# Patient Record
Sex: Female | Born: 1945 | Race: White | Hispanic: No | Smoking: Never smoker
Health system: Southern US, Community
[De-identification: ages and names within clinical notes are randomized; demographics above are authoritative.]

## PROBLEM LIST (undated history)

## (undated) ENCOUNTER — Emergency Department: Payer: Medicare Other

## (undated) DIAGNOSIS — M1990 Unspecified osteoarthritis, unspecified site: Secondary | ICD-10-CM

## (undated) DIAGNOSIS — F909 Attention-deficit hyperactivity disorder, unspecified type: Secondary | ICD-10-CM

## (undated) DIAGNOSIS — D649 Anemia, unspecified: Secondary | ICD-10-CM

## (undated) HISTORY — PX: TONSILLECTOMY: SHX5217

## (undated) HISTORY — DX: Anemia, unspecified: D64.9

## (undated) HISTORY — PX: OTHER SURGICAL HISTORY: SHX169

## (undated) HISTORY — PX: IR PORT REPAIR CENTRAL VENOUS ACCESS DEVICE: IMG5775

## (undated) HISTORY — DX: Unspecified osteoarthritis, unspecified site: M19.90

## (undated) HISTORY — DX: Attention-deficit hyperactivity disorder, unspecified type: F90.9

---

## 1999-12-12 DIAGNOSIS — Z86718 Personal history of other venous thrombosis and embolism: Secondary | ICD-10-CM | POA: Insufficient documentation

## 2000-03-10 DIAGNOSIS — F909 Attention-deficit hyperactivity disorder, unspecified type: Secondary | ICD-10-CM | POA: Insufficient documentation

## 2012-09-05 HISTORY — PX: OTHER SURGICAL HISTORY: SHX169

## 2012-09-30 DIAGNOSIS — Z961 Presence of intraocular lens: Secondary | ICD-10-CM

## 2012-09-30 HISTORY — DX: Presence of intraocular lens: Z96.1

## 2013-05-15 DIAGNOSIS — K219 Gastro-esophageal reflux disease without esophagitis: Secondary | ICD-10-CM

## 2013-05-15 HISTORY — DX: Gastro-esophageal reflux disease without esophagitis: K21.9

## 2013-08-04 DIAGNOSIS — I83023 Varicose veins of left lower extremity with ulcer of ankle: Secondary | ICD-10-CM | POA: Insufficient documentation

## 2013-08-28 DIAGNOSIS — R590 Localized enlarged lymph nodes: Secondary | ICD-10-CM | POA: Insufficient documentation

## 2013-10-05 DIAGNOSIS — C50919 Malignant neoplasm of unspecified site of unspecified female breast: Secondary | ICD-10-CM

## 2013-10-05 HISTORY — DX: Malignant neoplasm of unspecified site of unspecified female breast: C50.919

## 2014-02-25 DIAGNOSIS — C4402 Squamous cell carcinoma of skin of lip: Secondary | ICD-10-CM

## 2014-02-25 HISTORY — PX: MOHS SURGERY: SHX181

## 2014-02-25 HISTORY — DX: Squamous cell carcinoma of skin of lip: C44.02

## 2014-03-30 DIAGNOSIS — I82403 Acute embolism and thrombosis of unspecified deep veins of lower extremity, bilateral: Secondary | ICD-10-CM | POA: Insufficient documentation

## 2014-03-30 HISTORY — DX: Acute embolism and thrombosis of unspecified deep veins of lower extremity, bilateral: I82.403

## 2014-04-03 HISTORY — PX: OTHER SURGICAL HISTORY: SHX169

## 2014-09-14 DIAGNOSIS — Z853 Personal history of malignant neoplasm of breast: Secondary | ICD-10-CM | POA: Insufficient documentation

## 2014-09-14 DIAGNOSIS — Z923 Personal history of irradiation: Secondary | ICD-10-CM | POA: Insufficient documentation

## 2014-09-30 DIAGNOSIS — M6259 Muscle wasting and atrophy, not elsewhere classified, multiple sites: Secondary | ICD-10-CM | POA: Insufficient documentation

## 2014-09-30 DIAGNOSIS — R5381 Other malaise: Secondary | ICD-10-CM | POA: Insufficient documentation

## 2014-09-30 DIAGNOSIS — M1711 Unilateral primary osteoarthritis, right knee: Secondary | ICD-10-CM | POA: Insufficient documentation

## 2015-02-05 DIAGNOSIS — R413 Other amnesia: Secondary | ICD-10-CM | POA: Insufficient documentation

## 2015-02-05 HISTORY — DX: Other amnesia: R41.3

## 2015-11-16 DIAGNOSIS — M7989 Other specified soft tissue disorders: Secondary | ICD-10-CM | POA: Insufficient documentation

## 2015-11-16 DIAGNOSIS — Z7901 Long term (current) use of anticoagulants: Secondary | ICD-10-CM | POA: Insufficient documentation

## 2016-02-20 DIAGNOSIS — E8881 Metabolic syndrome: Secondary | ICD-10-CM

## 2016-02-20 DIAGNOSIS — I10 Essential (primary) hypertension: Secondary | ICD-10-CM | POA: Insufficient documentation

## 2016-02-20 HISTORY — DX: Metabolic syndrome: E88.81

## 2016-02-20 HISTORY — DX: Metabolic syndrome: E88.810

## 2016-02-20 HISTORY — DX: Essential (primary) hypertension: I10

## 2016-03-29 DIAGNOSIS — I83029 Varicose veins of left lower extremity with ulcer of unspecified site: Secondary | ICD-10-CM

## 2016-03-29 DIAGNOSIS — M8580 Other specified disorders of bone density and structure, unspecified site: Secondary | ICD-10-CM | POA: Insufficient documentation

## 2016-03-29 DIAGNOSIS — Z780 Asymptomatic menopausal state: Secondary | ICD-10-CM

## 2016-03-29 DIAGNOSIS — F900 Attention-deficit hyperactivity disorder, predominantly inattentive type: Secondary | ICD-10-CM | POA: Insufficient documentation

## 2016-03-29 DIAGNOSIS — L97929 Non-pressure chronic ulcer of unspecified part of left lower leg with unspecified severity: Secondary | ICD-10-CM

## 2016-03-29 HISTORY — DX: Non-pressure chronic ulcer of unspecified part of left lower leg with unspecified severity: L97.929

## 2016-03-29 HISTORY — DX: Varicose veins of left lower extremity with ulcer of unspecified site: I83.029

## 2016-03-29 HISTORY — DX: Asymptomatic menopausal state: Z78.0

## 2016-03-29 HISTORY — DX: Other specified disorders of bone density and structure, unspecified site: M85.80

## 2016-03-31 DIAGNOSIS — M6281 Muscle weakness (generalized): Secondary | ICD-10-CM

## 2016-03-31 HISTORY — DX: Muscle weakness (generalized): M62.81

## 2018-01-27 DIAGNOSIS — E872 Acidosis, unspecified: Secondary | ICD-10-CM

## 2018-01-27 HISTORY — DX: Acidosis: E87.2

## 2018-01-27 HISTORY — DX: Acidosis, unspecified: E87.20

## 2018-04-25 DIAGNOSIS — N2581 Secondary hyperparathyroidism of renal origin: Secondary | ICD-10-CM

## 2018-04-25 HISTORY — DX: Secondary hyperparathyroidism of renal origin: N25.81

## 2018-05-27 DIAGNOSIS — H04123 Dry eye syndrome of bilateral lacrimal glands: Secondary | ICD-10-CM | POA: Insufficient documentation

## 2018-05-27 DIAGNOSIS — H18599 Other hereditary corneal dystrophies, unspecified eye: Secondary | ICD-10-CM | POA: Insufficient documentation

## 2018-05-27 DIAGNOSIS — H524 Presbyopia: Secondary | ICD-10-CM | POA: Insufficient documentation

## 2018-10-22 DIAGNOSIS — L97221 Non-pressure chronic ulcer of left calf limited to breakdown of skin: Secondary | ICD-10-CM | POA: Insufficient documentation

## 2019-11-26 DIAGNOSIS — R8271 Bacteriuria: Secondary | ICD-10-CM | POA: Insufficient documentation

## 2019-11-26 DIAGNOSIS — D509 Iron deficiency anemia, unspecified: Secondary | ICD-10-CM | POA: Insufficient documentation

## 2020-06-26 DIAGNOSIS — N179 Acute kidney failure, unspecified: Secondary | ICD-10-CM | POA: Insufficient documentation

## 2020-09-03 DIAGNOSIS — D631 Anemia in chronic kidney disease: Secondary | ICD-10-CM | POA: Insufficient documentation

## 2020-09-03 DIAGNOSIS — N184 Chronic kidney disease, stage 4 (severe): Secondary | ICD-10-CM | POA: Insufficient documentation

## 2020-09-03 DIAGNOSIS — N1832 Chronic kidney disease, stage 3b: Secondary | ICD-10-CM | POA: Insufficient documentation

## 2021-02-08 DIAGNOSIS — H43813 Vitreous degeneration, bilateral: Secondary | ICD-10-CM | POA: Insufficient documentation

## 2021-03-18 DIAGNOSIS — S32000B Wedge compression fracture of unspecified lumbar vertebra, initial encounter for open fracture: Secondary | ICD-10-CM | POA: Insufficient documentation

## 2021-03-18 DIAGNOSIS — S32000A Wedge compression fracture of unspecified lumbar vertebra, initial encounter for closed fracture: Secondary | ICD-10-CM

## 2021-03-18 HISTORY — DX: Wedge compression fracture of unspecified lumbar vertebra, initial encounter for closed fracture: S32.000A

## 2021-03-28 DIAGNOSIS — F5101 Primary insomnia: Secondary | ICD-10-CM | POA: Insufficient documentation

## 2021-03-28 DIAGNOSIS — G893 Neoplasm related pain (acute) (chronic): Secondary | ICD-10-CM

## 2021-03-28 DIAGNOSIS — R2681 Unsteadiness on feet: Secondary | ICD-10-CM | POA: Insufficient documentation

## 2021-03-28 HISTORY — DX: Neoplasm related pain (acute) (chronic): G89.3

## 2021-04-27 DIAGNOSIS — C7951 Secondary malignant neoplasm of bone: Secondary | ICD-10-CM | POA: Insufficient documentation

## 2021-05-17 DIAGNOSIS — C7951 Secondary malignant neoplasm of bone: Secondary | ICD-10-CM | POA: Insufficient documentation

## 2021-05-17 DIAGNOSIS — M549 Dorsalgia, unspecified: Secondary | ICD-10-CM | POA: Insufficient documentation

## 2021-05-18 ENCOUNTER — Telehealth: Payer: Self-pay | Admitting: Hematology and Oncology

## 2021-05-18 NOTE — Telephone Encounter (Signed)
Scheduled appt per 6/8 referral. Called pt, no answer. Left msg with appt date and time.

## 2021-05-26 ENCOUNTER — Inpatient Hospital Stay: Payer: Medicare Other | Attending: Hematology and Oncology | Admitting: Hematology and Oncology

## 2021-05-26 DIAGNOSIS — C50919 Malignant neoplasm of unspecified site of unspecified female breast: Secondary | ICD-10-CM | POA: Insufficient documentation

## 2021-05-26 NOTE — Assessment & Plan Note (Deleted)
History of breast cancer diagnosed in 2014 s/p chemotherapy and lumpectomy in 2015.   Biopsy of L1 vertebral body on 04/19/21 showed metastatic carcinoma consistent with breast origin involving bone: ER 10%, PR 0%, HER2 amplified. She was started on the first line of Taxol plus HP (Treated at [facility] in [place] Dr. Harlin Heys) moved to [facility] to be closer to her daughter who is pregnant  Other health issues: Severe psychiatric distress related to brother who shot himself, other brother died from pancreatic cancer), venous stasis ulcers in the legs --------------------------------------------------------- Treatment plan: Taxol Herceptin Perjeta with Zometa for bone metastasis  Toxicities:

## 2021-06-06 ENCOUNTER — Telehealth: Payer: Self-pay | Admitting: Hematology and Oncology

## 2021-06-06 NOTE — Telephone Encounter (Signed)
Laura Santiago is a pt w/ a dx of metastatic breast who will be transferring to [place] in late part of [place]. She has been re-called and scheduled to see Dr Lindi Adie on 7/21 at 1pm. I provided my direct number for the pt in case anything changes prior to her appt.

## 2021-06-15 ENCOUNTER — Encounter: Payer: Self-pay | Admitting: *Deleted

## 2021-06-15 NOTE — Progress Notes (Signed)
Received call from Nira Conn, RN with Dr. Sondra Come at [facility] [phone]) stating pt missed D1C3 Taxol, Herceptin, and Perjeta dose this week due to pt moving to [place] from [name].  States the office will fax over treatment plan for MD to review with pt upcoming visit.

## 2021-06-22 ENCOUNTER — Telehealth: Payer: Self-pay | Admitting: Hematology and Oncology

## 2021-06-22 NOTE — Telephone Encounter (Signed)
I received a call from the pt's daughter to reschedule her appt to see Dr. Lindi Adie on 7/18 at 1pm.

## 2021-06-26 NOTE — Progress Notes (Signed)
[facility] CONSULT NOTE  Patient Care Team: Pcp, No as PCP - General  CHIEF COMPLAINTS/PURPOSE OF CONSULTATION:  Newly diagnosed metastatic breast cancer to the bone  HISTORY OF PRESENTING ILLNESS:  Laura Santiago 75 y.o. 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 She was referred to the clinic by Christene Lye, MD. CT Chest on 04/09 showed numerous lytic and sclerotic lesions throughout the visualized portions of the skeleton, most notably within the T2 vertebral body and multiple pulmonary nodules measuring up to 5 mm. CT AP on 03/19/21 showed multiple new sclerotic osseous lesions in the visualized spine and pelvis are compatible with metastases as well as left inguinal/pelvic lymphadenopathy is decreased in size compared to 2014. MRI Brain on 03/19/21 showed 9 mm enhancing lesion in the midline frontal bone, likely representing osseous metastatic disease. She recently relocated to [place] from [place]. She presents to the clinic today to establish care.   I reviewed her records extensively and collaborated the history with the patient.  SUMMARY OF ONCOLOGIC HISTORY: Oncology History  Metastatic breast cancer ([place])  2014 Initial Diagnosis   history of breast cancer diagnosed in 2014 s/p chemotherapy with Herceptin and lumpectomy, radiation and antiestrogen therapy that was completed in 2020 (in [name])   03/18/2021 - 03/24/2021 Hospital Admission   New neck and shoulder pain: Scans revealed lytic lesions C5-C6 with pathological compression fractures (developed steroid-induced mania) status post 1 round of radiation (Dr. Sondra Come in [place] was her medical oncologist)   04/19/2021 Relapse/Recurrence   Biopsy of L1 vertebral body on 04/19/21 showed metastatic carcinoma consistent with breast origin involving bone: ER 10%, PR 0%, HER2 ratio 5.1)   05/06/2021 -  Chemotherapy   Taxol  (weekly) Herceptin Perjeta palliative chemotherapy started in [name], moved to be closer to her family in [place]:  Lumbar compression fracture Metastatic breast cancer with bone metastases to the spine Stage IV chronic kidney disease Prior history of iron deficiency anemia Venous stasis ulcers DVT iliac vein both lower extremities 2019 Hyperparathyroidism Hypertension Memory issues Osteopenia  SURGICAL HISTORY: Right breast lumpectomy  SOCIAL HISTORY: She is divorced and is finally moved in closer to be with her daughter who is her primary caregiver.  FAMILY HISTORY: No family history of breast cancer.  Mickel Baas today for your time   ALLERGIES:  has no allergies on file.  MEDICATIONS:  No current outpatient medications on file.   No current facility-administered medications for this visit.    REVIEW OF SYSTEMS:   Ulceration of leg Generalized fatigue and weakness Psych: Denial of severity of her problems and symptoms  PHYSICAL EXAMINATION: ECOG PERFORMANCE STATUS: 2 - Symptomatic, <50% confined to bed     Vitals:   06/27/21 1323  BP: (!) 146/73  Pulse: (!) 111  Resp: 20  Temp: 97.7 F (36.5 C)  SpO2: 98%   Filed Weights   06/27/21 1323  Weight: 150 lb 9 oz (68.3 kg)   RADIOGRAPHIC STUDIES: I have personally reviewed the radiological reports and agreed with the findings in the report.  ASSESSMENT AND PLAN:  Metastatic breast cancer ([place]) History of breast cancer in 2014 status post lumpectomy and chemotherapy with Herceptin, radiation and antiestrogen therapy that was completed in 2020  Hospitalization 03/18/2021-03/25/2019 Biopsy of L1 vertebral body 04/19/2021 showed metastatic carcinoma ER 10%, PR 0%, HER2 amplified  Current treatment: Taxol  Herceptin Perjeta started May 2022 I discussed with her and her daughter that she will need to continue systemic chemotherapy for at least another cycle before obtaining scans.  Potential  plan would be 6 cycles of chemo followed by PET CT scan and Herceptin Perjeta maintenance subsequently.  Social issues: Patient has been moved to an assisted living facility who can arrange transportation to [facility].  Therefore I discussed the case with Dr. Janese Banks who graciously agreed to see the patient.   Wound issues: Patient tells me that she ran out of wound care dressings and upon further evaluation by our nursing's staff she has profound ulceration of her left lower extremity with multiple areas of skin excoriation and granulation tissue some of which was very unhealthy and the Bandage that she applied has probably not been changed in a very long time.  Extensive cleaning and dressing was performed and she was provided with supplies to go home.  She will need a proper wound care consult to handle the wound issues.  I discussed with her that it is life-threatening if she receives chemotherapy with active infection.  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    All questions were answered. The patient knows to call the clinic with any problems, questions or concerns.   Rulon Eisenmenger, MD, MPH 06/27/2021    I, Thana Ates, am acting as scribe for Nicholas Lose, MD.  I have reviewed the above documentation for accuracy and completeness, and I agree with the above.

## 2021-06-27 ENCOUNTER — Telehealth: Payer: Self-pay | Admitting: Oncology

## 2021-06-27 ENCOUNTER — Inpatient Hospital Stay: Payer: Medicare Other | Attending: Hematology and Oncology | Admitting: Hematology and Oncology

## 2021-06-27 ENCOUNTER — Other Ambulatory Visit: Payer: Self-pay

## 2021-06-27 DIAGNOSIS — Z170 Estrogen receptor positive status [ER+]: Secondary | ICD-10-CM | POA: Insufficient documentation

## 2021-06-27 DIAGNOSIS — Z9221 Personal history of antineoplastic chemotherapy: Secondary | ICD-10-CM | POA: Insufficient documentation

## 2021-06-27 DIAGNOSIS — C7951 Secondary malignant neoplasm of bone: Secondary | ICD-10-CM | POA: Diagnosis present

## 2021-06-27 DIAGNOSIS — Z923 Personal history of irradiation: Secondary | ICD-10-CM | POA: Insufficient documentation

## 2021-06-27 DIAGNOSIS — C50919 Malignant neoplasm of unspecified site of unspecified female breast: Secondary | ICD-10-CM | POA: Insufficient documentation

## 2021-06-27 NOTE — Assessment & Plan Note (Addendum)
History of breast cancer in 2014 status post lumpectomy and chemotherapy with Herceptin, radiation and antiestrogen therapy that was completed in 2020  Hospitalization 03/18/2021-03/25/2019 Biopsy of L1 vertebral body 04/19/2021 showed metastatic carcinoma ER 10%, PR 0%, HER2 amplified  Current treatment: Taxol Herceptin Perjeta started May 2022 I discussed with her and her daughter that she will need to continue systemic chemotherapy for at least another cycle before obtaining scans.  Potential plan would be 6 cycles of chemo followed by PET CT scan and Herceptin Perjeta maintenance subsequently.  Social issues: Patient has been moved to an assisted living facility who can arrange transportation to [facility]  Therefore I discussed the case with Dr. Janese Banks who graciously agreed to see the patient.   Wound issues: Patient tells me that she ran out of wound care dressings and upon further evaluation by our nursing's staff she has profound ulceration of her right lower extremity with multiple areas of skin excoriation and granulation tissue some of which was very unhealthy and the Band-Aids that she applied has probably not been changed in a very long time.  Extensive cleaning and dressing was performed and she was provided with supplies to go home.  She will need a proper wound care consult to handle the wound issues.  I discussed with her that it is life-threatening if she receives chemotherapy with active infection.  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

## 2021-06-27 NOTE — Progress Notes (Signed)
Attempted to call pt's daughter, Laura Santiago to inform her we sent referral over to [facility] at [name] and pt was given appt for 07/06/21 at 0945. LVM with details and advised to call [phone] with any questions.

## 2021-06-27 NOTE — Telephone Encounter (Signed)
Left VM with patient and daughter (casey) to make her aware of urgent referral sent to Dr. Janese Banks and appointment made on 7/19 at 1pm. Requested a call back to confirm.

## 2021-06-28 ENCOUNTER — Encounter (INDEPENDENT_AMBULATORY_CARE_PROVIDER_SITE_OTHER): Payer: Self-pay

## 2021-06-28 ENCOUNTER — Inpatient Hospital Stay: Payer: Medicare Other | Attending: Oncology | Admitting: Oncology

## 2021-06-28 ENCOUNTER — Encounter: Payer: Self-pay | Admitting: Oncology

## 2021-06-28 ENCOUNTER — Other Ambulatory Visit: Payer: Self-pay | Admitting: *Deleted

## 2021-06-28 ENCOUNTER — Ambulatory Visit: Payer: Medicare Other | Admitting: Hematology and Oncology

## 2021-06-28 VITALS — BP 111/68 | HR 89 | Temp 98.7°F | Resp 16 | Ht 64.0 in | Wt 151.5 lb

## 2021-06-28 DIAGNOSIS — Z7189 Other specified counseling: Secondary | ICD-10-CM

## 2021-06-28 DIAGNOSIS — Z800 Family history of malignant neoplasm of digestive organs: Secondary | ICD-10-CM | POA: Diagnosis not present

## 2021-06-28 DIAGNOSIS — C778 Secondary and unspecified malignant neoplasm of lymph nodes of multiple regions: Secondary | ICD-10-CM | POA: Diagnosis not present

## 2021-06-28 DIAGNOSIS — Z833 Family history of diabetes mellitus: Secondary | ICD-10-CM | POA: Insufficient documentation

## 2021-06-28 DIAGNOSIS — M8580 Other specified disorders of bone density and structure, unspecified site: Secondary | ICD-10-CM | POA: Diagnosis not present

## 2021-06-28 DIAGNOSIS — C50911 Malignant neoplasm of unspecified site of right female breast: Secondary | ICD-10-CM | POA: Diagnosis present

## 2021-06-28 DIAGNOSIS — C50919 Malignant neoplasm of unspecified site of unspecified female breast: Secondary | ICD-10-CM | POA: Diagnosis not present

## 2021-06-28 DIAGNOSIS — C7951 Secondary malignant neoplasm of bone: Secondary | ICD-10-CM

## 2021-06-28 DIAGNOSIS — G893 Neoplasm related pain (acute) (chronic): Secondary | ICD-10-CM | POA: Insufficient documentation

## 2021-06-28 DIAGNOSIS — Z5111 Encounter for antineoplastic chemotherapy: Secondary | ICD-10-CM | POA: Insufficient documentation

## 2021-06-28 DIAGNOSIS — Z86718 Personal history of other venous thrombosis and embolism: Secondary | ICD-10-CM | POA: Insufficient documentation

## 2021-06-28 DIAGNOSIS — Z7901 Long term (current) use of anticoagulants: Secondary | ICD-10-CM | POA: Insufficient documentation

## 2021-06-28 DIAGNOSIS — C779 Secondary and unspecified malignant neoplasm of lymph node, unspecified: Secondary | ICD-10-CM | POA: Diagnosis not present

## 2021-06-28 DIAGNOSIS — Z8042 Family history of malignant neoplasm of prostate: Secondary | ICD-10-CM | POA: Insufficient documentation

## 2021-06-28 DIAGNOSIS — Z79899 Other long term (current) drug therapy: Secondary | ICD-10-CM | POA: Insufficient documentation

## 2021-06-28 DIAGNOSIS — C787 Secondary malignant neoplasm of liver and intrahepatic bile duct: Secondary | ICD-10-CM | POA: Diagnosis not present

## 2021-06-28 DIAGNOSIS — Z9221 Personal history of antineoplastic chemotherapy: Secondary | ICD-10-CM

## 2021-06-28 DIAGNOSIS — Z170 Estrogen receptor positive status [ER+]: Secondary | ICD-10-CM

## 2021-06-28 DIAGNOSIS — Z8249 Family history of ischemic heart disease and other diseases of the circulatory system: Secondary | ICD-10-CM | POA: Insufficient documentation

## 2021-06-28 DIAGNOSIS — Z5112 Encounter for antineoplastic immunotherapy: Secondary | ICD-10-CM | POA: Insufficient documentation

## 2021-06-28 DIAGNOSIS — Z923 Personal history of irradiation: Secondary | ICD-10-CM

## 2021-06-28 MED ORDER — ONDANSETRON HCL 8 MG PO TABS: 8.0000 mg | ORAL_TABLET | Freq: Two times a day (BID) | ORAL | 1 refills | Status: DC | PRN

## 2021-06-28 MED ORDER — LIDOCAINE-PRILOCAINE 2.5-2.5 % EX CREA: TOPICAL_CREAM | CUTANEOUS | 3 refills | Status: DC

## 2021-06-28 MED ORDER — PROCHLORPERAZINE MALEATE 10 MG PO TABS: 10.0000 mg | ORAL_TABLET | Freq: Four times a day (QID) | ORAL | 1 refills | Status: DC | PRN

## 2021-06-28 NOTE — Progress Notes (Signed)
START ON PATHWAY REGIMEN - Breast     Cycle 1: A cycle is 21 days:     Pertuzumab      Trastuzumab-xxxx      Paclitaxel    Cycles 2 through 8: A cycle is every 21 days:     Pertuzumab      Trastuzumab-xxxx      Paclitaxel    Cycles 9 and beyond: A cycle is every 21 days:     Pertuzumab      Trastuzumab-xxxx   **Always confirm dose/schedule in your pharmacy ordering system**  Patient Characteristics: Distant Metastases or Locoregional Recurrent Disease - Unresected or Locally Advanced Unresectable Disease Progressing after Neoadjuvant and Local Therapies, HER2 Positive, ER Positive, Chemotherapy + HER2-Targeted Therapy, First Line Therapeutic Status: Distant Metastases ER Status: Positive (+) HER2 Status: Positive (+) PR Status: Negative (-) Line of Therapy: First Line Intent of Therapy: Non-Curative / Palliative Intent, Discussed with Patient

## 2021-06-28 NOTE — Progress Notes (Signed)
Hematology/Oncology Consult note [facility] [phone] [phone]  Patient Care Team: Pcp, No as PCP - General   Name of the patient: Laura Santiago  [id number]  1946/09/23    Reason for referral-metastatic HER2 positive breast cancer   Referring physician-Dr. Lindi Adie  Date of visit: 06/28/21   History of presenting illness- Patient is a 75 year old female with a past medical history significant for stage IV CKD, history of DVT on Xarelto, venous stasis and chronic right lower extremity ulceration hypertension among other medical problems.  She had a screening mammogram in September 2014 which showed 2.1 x 2.3 x 1.8 cm irregular mass in her right breast.  It was ER 95% positive PR negative and HER2 positive +3.  She received neoadjuvant chemotherapy with Taxol Herceptin and Perjeta for 4 cycles followed by dose dense AC/Herceptin x4 which she completed in March 2015.  She had a right lumpectomy on 04/03/2014 which showed scant residual invasive ductal carcinoma [id number].  She completed 1 year of adjuvant Herceptin chemotherapy and also completed adjuvant radiation treatment.  She was recommended anastrozole which she took on and off starting November 2015 and stopped sometime in 2020.  She was then hospitalized with neck pain and was found to have lytic lesions involving C5-C6 with pathological vertebral fractures.  She underwent radiation treatment to this area.  Image guided biopsy of the L1 vertebral body showed metastatic carcinoma consistent with breast origin ER 10% PR 0% and HER2 amplified ratio 5.1 average HER2 signal number per cell 15.0 average CEP 17 signals number per cell 3.0.  Baseline echocardiogram on 05/02/2021 showed a normal EF of 62% she was recommended Taxol Herceptin and Perjeta which she received for 2 cycles at [facility] until June 10, 2021  She has chronic pain from her bone metastases for which she is currently on oxycodone 10 mg  every 4 hours as needed and 12 mcg fentanyl patch.  She was seeing pain clinic when she was living in [name].  Patient has also been on Zometa when she was in [name] but she does have some ongoing dental issues.  She has received Xgeva in the past as well.  Her last Delton See was in July 2021.  Last PET scan was on 03/21/2021 which showed diffuse osseous metastatic disease involving the head neck chest abdomen and pelvis and spine.  Left lung apex hypermetabolic nodule and multiple hypermetabolic liver lesions concerning for disease involvement.  Enlarged hypermetabolic left inguinal lymph nodes along with hypermetabolic external iliac and left supraclavicular lymph nodes  Patient is now moved to [place] to be close to her daughter.  She lives in an independent living.  ECOG PS- 2  Pain scale- 3   Review of systems- Review of Systems  Constitutional:  Positive for malaise/fatigue. Negative for chills, fever and weight loss.  HENT:  Negative for congestion, ear discharge and nosebleeds.   Eyes:  Negative for blurred vision.  Respiratory:  Negative for cough, hemoptysis, sputum production, shortness of breath and wheezing.   Cardiovascular:  Negative for chest pain, palpitations, orthopnea and claudication.  Gastrointestinal:  Negative for abdominal pain, blood in stool, constipation, diarrhea, heartburn, melena, nausea and vomiting.  Genitourinary:  Negative for dysuria, flank pain, frequency, hematuria and urgency.  Musculoskeletal:  Positive for back pain and neck pain. Negative for joint pain and myalgias.  Skin:  Negative for rash.  Neurological:  Negative for dizziness, tingling, focal weakness, seizures, weakness and headaches.  Endo/Heme/Allergies:  Does not bruise/bleed  easily.  Psychiatric/Behavioral:  Negative for depression and suicidal ideas. The patient does not have insomnia.    Allergies  Allergen Reactions   Corticosteroids Other (See Comments)    Pt trf from [name] and per  primary md for her cancer tx. Notes that it causes agitation intolerance   Sulfa Antibiotics Other (See Comments)    Pt moved from [name] and in MD notes she has allergy but we do not know reactions when taking the drug   Celebrex [Celecoxib] Rash    Patient Active Problem List   Diagnosis Date Noted   Metastatic breast cancer ([place]) 05/26/2021     Past Medical History:  Diagnosis Date   ADHD (attention deficit hyperactivity disorder)    in [place], no date on md note   Anemia    IDA 11/26/2019, Anemia in stage 4 chronic kidney disease ([place]) 09/03/2020   Arthritis    osteoarthritis right knee 09/30/2014   Breast cancer ([place]) 10/05/2013   in [place] +, [place] -, Her 2 is 3+   Cancer related pain 03/28/2021   in [name], md notes spine mets   DVT of lower extremity, bilateral ([place]) 03/30/2014   in [name]   Generalized muscle weakness 03/31/2016   in [name]   GERD (gastroesophageal reflux disease) 05/15/2013   per md in [name]   Hyperparathyroidism, secondary ([place]) 04/25/2018   in [name]   Hypertension 02/20/2016   info from MD in [facility]   Lumbar compression fracture ([place]) 03/18/2021   in [place] loss 02/05/2015   in [name]   Metabolic acidosis 37/34/2876   in [place]   Metabolic syndrome 81/15/7262   in [name]   Osteopenia after menopause 03/29/2016   in [place] of both eyes 09/30/2012   per md in [name] where pt. 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   in [name]   Stasis ulcer of left lower extremity ([place]) 03/29/2016   in [name]     Past Surgical History:  Procedure Laterality Date   CESAREAN SECTION     unknown   fibroid removed  N/A    in [place] - [place] date   IR PORT REPAIR CENTRAL VENOUS ACCESS DEVICE Left    In [place] N/A 02/25/2014   in [name]   ovary removed      unknown   [place] CATARACT EXTRACAP,INSERT LENS Bilateral  Bilateral 09/05/2012   in [place]     unknown    LUMPECTOMY Right 04/03/2014   in [place]     Social History   Socioeconomic History   Marital status: Divorced    Spouse name: Not on file   Number of children: Not on file   Years of education: Not on file   Highest education level: Not on file  Occupational History   Occupation: retired Teacher, music    Comment: In [place]  Tobacco Use   Smoking status: Never   Smokeless tobacco: Never  Vaping Use   Vaping Use: Never used  Substance and Sexual Activity   Alcohol use: Not Currently   Drug use: Never   Sexual activity: Not Currently  Other Topics Concern   Not on file  Social History Narrative   Not on file   Social Determinants of Health   Financial Resource Strain: Not on file  Food Insecurity: Not on file  Transportation Needs: Not on file  Physical Activity: Not on file  Stress: Not on file  Social Connections: Not on file  Intimate Partner Violence: Not on file     Family History  Problem Relation Age of Onset   Pancreatic cancer Mother    Stroke Father    Diabetes Father    Hypertension Father    Heart disease Father    Skin cancer Father    Varicose Veins Father    Skin cancer Brother    Cancer - Prostate Brother      Current Outpatient Medications:    acetaminophen (TYLENOL) 500 MG tablet, Take 500 mg by mouth every 6 (six) hours as needed for mild pain ($RemoveBe'500mg'gBNVUNswO$  to $R'1000mg'gk$  Q6 hours PRN)., Disp: , Rfl:    Calcium 200 MG TABS, Take 1 tablet by mouth daily., Disp: , Rfl:    fentaNYL (DURAGESIC) 12 MCG/HR, Place 1 patch onto the skin every 3 (three) days., Disp: , Rfl:    gabapentin (NEURONTIN) 400 MG capsule, Take 400 mg by mouth 2 (two) times daily., Disp: , Rfl:    lidocaine-prilocaine (EMLA) cream, Apply to affected area once, Disp: 30 g, Rfl: 3   lisinopril (ZESTRIL) 20 MG tablet, Take 20 mg by mouth daily., Disp: , Rfl:    Multiple Vitamin (MULTIVITAMIN ADULT PO), Take 1 tablet by mouth daily., Disp: , Rfl:    ondansetron (ZOFRAN) 8 MG tablet, Take 1 tablet (8 mg total) by mouth  2 (two) times daily as needed (Nausea or vomiting)., Disp: 30 tablet, Rfl: 1   Oxycodone HCl 10 MG TABS, Take 10 mg by mouth every 4 (four) hours as needed., Disp: , Rfl:    prochlorperazine (COMPAZINE) 10 MG tablet, Take 1 tablet (10 mg total) by mouth every 6 (six) hours as needed (Nausea or vomiting)., Disp: 30 tablet, Rfl: 1   rivaroxaban (XARELTO) 20 MG TABS tablet, Take 20 mg by mouth daily with supper., Disp: , Rfl:    Physical exam:  Vitals:   06/29/21 1300  BP: 111/68  Pulse: 89  Resp: 16  Temp: 98.7 F (37.1 C)  TempSrc: Oral  Weight: 151 lb 8 oz (68.7 kg)  Height: $Remove'5\' 4"'bQUaNxV$  (1.626 m)    Physical Exam Constitutional:      General: She is not in acute distress. Cardiovascular:     Rate and Rhythm: Normal rate and regular rhythm.     Heart sounds: Normal heart sounds.  Pulmonary:     Effort: Pulmonary effort is normal.     Breath sounds: Normal breath sounds.  Abdominal:     General: Bowel sounds are normal.     Palpations: Abdomen is soft.  Musculoskeletal:     Comments: Chronic left lower extremity ulceration involving the lower half of the leg on the lateral aspect overlying the tibia.  Skin:    General: Skin is warm and dry.  Neurological:     Mental Status: She is alert and oriented to person, place, and time.         No results found.  Assessment and plan- Patient is a 75 y.o. female with history of HER2 positive breast cancer in 2014 now diagnosed with diffuse areas of bone liver and lymph node metastases ER weakly +10%, PR negative and HER2 positive.  She is transferring her care from [name]  I have reviewed outside PET CT scan report as well as pathology report which confirms multiple areas of bone metastases as well as liver and lymph node metastases.  Her tumor was weakly ER +10% PR negative and HER2  positive.  She received 2 cycles of chemotherapy with Taxol Herceptin and Perjeta before transferring her care here.  She will proceed with Taxol Herceptin  and Perjeta on 07/01/2021.  She will then get weekly Taxol in 1 week in 2 weeks and I will see her back in 2 weeks.  We will plan to get outside images of the PET scan loaded in our system as well and after about 2 cycles I will plan to repeat her imaging.  Her last echocardiogram on 05/02/2021 showed a normal EF of 62% and we will repeat another echocardiogram next month.  Patient has received Zometa in the past but I would like to see her note from her prior dentist given that she had some ongoing dental issues before I give her the next dose of Zometa which would be in the next 2 to 3 weeks.  Discussed risks and benefits of Taxol including all but not limited to nausea, vomiting, low blood counts, risk of infections and hospitalization as well as hair loss and peripheral neuropathy.  Discussed risks and benefits of Herceptin and Perjeta including all but not limited to skin rash and diarrhea as well as cardiotoxicity.  Patient understands and agrees to proceed as planned  Chronic left lower extremity ulcer.  Patient has had it for many years now and wound care consult is also in place  Cancer Staging Metastatic breast cancer [facility]) Staging form: Breast, AJCC 8th Edition - Clinical: Stage IV (pM1, ER+, PR-, HER2+) - Signed by [name], MD on 06/28/2021    Total face to face encounter time for this patient visit was 50 min.Time spent in reviewing outside records 20 min    Thank you for this kind referral and the opportunity to participate in the care of this patient   Visit Diagnosis 1. Metastatic breast cancer ([place])   2. Goals of care, counseling/discussion   3. Bone metastases ([place])     Dr. Randa Evens, MD, MPH [facility] at [facility] [phone] 06/28/2021

## 2021-06-28 NOTE — Progress Notes (Signed)
Pt moved from [name] for daughter to help with her care. She lives in independent living. She has transportation from the place she lives. She has neuropathy, lost weight in last few months. Patient has left lower leg ulcer that she has had for few years- changed dressing for pt today- sh uses vaseline gauze, then abd pad and then covering over it to keep it from falling off.- today I use burn netting and then pt. Put a wrap on it that looked like solid knitting sock but pt says it is compression stocking.pt does have memory issues per the pt. Speaking about the issue. She has weakness in muscles especially lower extremities and today use a staxi wheelchair an held on to it and walked like it was a cart in grocery store

## 2021-06-29 ENCOUNTER — Encounter: Payer: Self-pay | Admitting: Oncology

## 2021-06-30 ENCOUNTER — Ambulatory Visit: Payer: Medicare Other | Admitting: Hematology and Oncology

## 2021-07-01 ENCOUNTER — Ambulatory Visit
Admission: RE | Admit: 2021-07-01 | Discharge: 2021-07-01 | Disposition: A | Discharge status: Home / Self Care | Payer: Medicare Other | Attending: Oncology | Admitting: Oncology

## 2021-07-01 ENCOUNTER — Inpatient Hospital Stay: Payer: Medicare Other

## 2021-07-01 ENCOUNTER — Telehealth: Payer: Self-pay | Admitting: Oncology

## 2021-07-01 ENCOUNTER — Ambulatory Visit
Admission: RE | Admit: 2021-07-01 | Discharge: 2021-07-01 | Disposition: A | Discharge status: Home / Self Care | Payer: Medicare Other | Source: Ambulatory Visit | Attending: Oncology | Admitting: Oncology

## 2021-07-01 ENCOUNTER — Other Ambulatory Visit: Payer: Self-pay

## 2021-07-01 ENCOUNTER — Other Ambulatory Visit: Payer: Self-pay | Admitting: Oncology

## 2021-07-01 DIAGNOSIS — C50919 Malignant neoplasm of unspecified site of unspecified female breast: Secondary | ICD-10-CM | POA: Insufficient documentation

## 2021-07-01 DIAGNOSIS — R7989 Other specified abnormal findings of blood chemistry: Secondary | ICD-10-CM

## 2021-07-01 DIAGNOSIS — C7951 Secondary malignant neoplasm of bone: Secondary | ICD-10-CM

## 2021-07-01 LAB — CBC WITH DIFFERENTIAL/PLATELET
Abs Immature Granulocytes: 0.16 10*3/uL — ABNORMAL HIGH (ref 0.00–0.07)
Basophils Absolute: 0 10*3/uL (ref 0.0–0.1)
Basophils Relative: 1 %
Eosinophils Absolute: 0.2 10*3/uL (ref 0.0–0.5)
Eosinophils Relative: 3 %
HCT: 28.9 % — ABNORMAL LOW (ref 36.0–46.0)
Hemoglobin: 9 g/dL — ABNORMAL LOW (ref 12.0–15.0)
Immature Granulocytes: 2 %
Lymphocytes Relative: 11 %
Lymphs Abs: 0.8 10*3/uL (ref 0.7–4.0)
MCH: 28.8 pg (ref 26.0–34.0)
MCHC: 31.1 g/dL (ref 30.0–36.0)
MCV: 92.3 fL (ref 80.0–100.0)
Monocytes Absolute: 0.8 10*3/uL (ref 0.1–1.0)
Monocytes Relative: 10 %
Neutro Abs: 5.8 10*3/uL (ref 1.7–7.7)
Neutrophils Relative %: 73 %
Platelets: 229 10*3/uL (ref 150–400)
RBC: 3.13 MIL/uL — ABNORMAL LOW (ref 3.87–5.11)
RDW: 16.5 % — ABNORMAL HIGH (ref 11.5–15.5)
WBC: 7.8 10*3/uL (ref 4.0–10.5)
nRBC: 0 % (ref 0.0–0.2)

## 2021-07-01 LAB — COMPREHENSIVE METABOLIC PANEL
ALT: 18 U/L (ref 0–44)
AST: 25 U/L (ref 15–41)
Albumin: 3.5 g/dL (ref 3.5–5.0)
Alkaline Phosphatase: 73 U/L (ref 38–126)
Anion gap: 10 (ref 5–15)
BUN: 36 mg/dL — ABNORMAL HIGH (ref 8–23)
CO2: 23 mmol/L (ref 22–32)
Calcium: 8.4 mg/dL — ABNORMAL LOW (ref 8.9–10.3)
Chloride: 103 mmol/L (ref 98–111)
Creatinine, Ser: 2.06 mg/dL — ABNORMAL HIGH (ref 0.44–1.00)
GFR, Estimated: 25 mL/min — ABNORMAL LOW (ref >60)
Glucose, Bld: 134 mg/dL — ABNORMAL HIGH (ref 70–99)
Potassium: 4.4 mmol/L (ref 3.5–5.1)
Sodium: 136 mmol/L (ref 135–145)
Total Bilirubin: 0.3 mg/dL (ref 0.3–1.2)
Total Protein: 6.7 g/dL (ref 6.5–8.1)

## 2021-07-01 MED ORDER — PACLITAXEL CHEMO INJECTION 300 MG/50ML: 65.0000 mg/m2 | Freq: Once | INTRAVENOUS | Status: DC

## 2021-07-01 MED ORDER — SODIUM CHLORIDE 0.9 % IV SOLN
INTRAVENOUS | Status: DC
  Filled 2021-07-01 (×2): qty 250

## 2021-07-01 MED ORDER — FAMOTIDINE 20 MG IN NS 100 ML IVPB
20.0000 mg | Freq: Once | INTRAVENOUS | Status: DC
  Filled 2021-07-01: qty 100

## 2021-07-01 MED ORDER — ACETAMINOPHEN 325 MG PO TABS: 650.0000 mg | ORAL_TABLET | Freq: Once | ORAL | Status: DC

## 2021-07-01 MED ORDER — SODIUM CHLORIDE 0.9% FLUSH
10.0000 mL | Freq: Once | INTRAVENOUS | Status: AC
  Administered 2021-07-01: 10 mL via INTRAVENOUS
  Filled 2021-07-01: qty 10

## 2021-07-01 MED ORDER — SODIUM CHLORIDE 0.9 % IV SOLN
10.0000 mg | Freq: Once | INTRAVENOUS | Status: DC
  Filled 2021-07-01: qty 1

## 2021-07-01 MED ORDER — HEPARIN SOD (PORK) LOCK FLUSH 100 UNIT/ML IV SOLN
INTRAVENOUS | Status: AC
  Filled 2021-07-01: qty 5

## 2021-07-01 MED ORDER — HEPARIN SOD (PORK) LOCK FLUSH 100 UNIT/ML IV SOLN
500.0000 [IU] | Freq: Once | INTRAVENOUS | Status: AC | PRN
  Administered 2021-07-01: 500 [IU]
  Filled 2021-07-01: qty 5

## 2021-07-01 MED ORDER — DIPHENHYDRAMINE HCL 50 MG/ML IJ SOLN: 50.0000 mg | Freq: Once | INTRAMUSCULAR | Status: DC

## 2021-07-01 MED ORDER — SODIUM CHLORIDE 0.9 % IV SOLN: 840.0000 mg | Freq: Once | INTRAVENOUS | Status: DC

## 2021-07-01 MED ORDER — SODIUM CHLORIDE 0.9 % IV SOLN: 8.0000 mg/kg | Freq: Once | INTRAVENOUS | Status: DC

## 2021-07-01 MED ORDER — SODIUM CHLORIDE 0.9 % IV SOLN
Freq: Once | INTRAVENOUS | Status: DC
  Filled 2021-07-01: qty 250

## 2021-07-01 NOTE — Progress Notes (Signed)
Re: Poor blood return from port   Unable to get blood return from patient's port this morning.  Patient was re accessed. Port is flushing well and does not cause the patient any pain.  Spoke with infusion staff and agreeable to proceed with treatment for today but will get a chest x-ray to confirm placement and if in the correct spot we will proceed with tPA.  Faythe Casa, NP 07/01/2021 9:21 AM

## 2021-07-01 NOTE — Telephone Encounter (Signed)
Re: reschedule chemo  Patient was asked cancer center today for treatment and unfortunately had no blood return from her port.  Her labs also showed elevated kidney function with a creatinine greater than 2.  Baseline appears to be around 1.3-1.4.  We were able to give her a liter of normal saline while in clinic but asked that she have a chest x-ray to ensure proper placement of her port so that we could try tPA at her next visit.  Chest x-ray showed left subclavian Port-A-Cath to be in expected position of the SVC.  Spoke with Moishe Spice, RN of Dr. Janese Banks to get her rescheduled ASAP for treatment with possible tPA if she has no blood return.  Patient's daughter called after-hours phone line to discuss plan moving forward.  She is in agreement and would like a phone call with regards to date and time of her next appointment.  Her number is [phone].   Faythe Casa, NP 07/01/2021 6:05 PM

## 2021-07-01 NOTE — Patient Instructions (Signed)
[facility]  Discharge Instructions: Thank you for choosing [facility] to provide your oncology and hematology care.  If you have a lab appointment with the Cancer Center, please go directly to the Cancer Center and check in at the registration area.  Wear comfortable clothing and clothing appropriate for easy access to any Portacath or PICC line.   We strive to give you quality time with your provider. You may need to reschedule your appointment if you arrive late (15 or more minutes).  Arriving late affects you and other patients whose appointments are after yours.  Also, if you miss three or more appointments without notifying the office, you may be dismissed from the clinic at the provider's discretion.      For prescription refill requests, have your pharmacy contact our office and allow 72 hours for refills to be completed.      To help prevent nausea and vomiting after your treatment, we encourage you to take your nausea medication as directed.  BELOW ARE SYMPTOMS THAT SHOULD BE REPORTED IMMEDIATELY: *FEVER GREATER THAN 100.4 F (38 C) OR HIGHER *CHILLS OR SWEATING *NAUSEA AND VOMITING THAT IS NOT CONTROLLED WITH YOUR NAUSEA MEDICATION *UNUSUAL SHORTNESS OF BREATH *UNUSUAL BRUISING OR BLEEDING *URINARY PROBLEMS (pain or burning when urinating, or frequent urination) *BOWEL PROBLEMS (unusual diarrhea, constipation, pain near the anus) TENDERNESS IN MOUTH AND THROAT WITH OR WITHOUT PRESENCE OF ULCERS (sore throat, sores in mouth, or a toothache) UNUSUAL RASH, SWELLING OR PAIN  UNUSUAL VAGINAL DISCHARGE OR ITCHING   Items with * indicate a potential emergency and should be followed up as soon as possible or go to the Emergency Department if any problems should occur.  Please show the CHEMOTHERAPY ALERT CARD or IMMUNOTHERAPY ALERT CARD at check-in to the Emergency Department and triage nurse.  Should you have questions after your  visit or need to cancel or reschedule your appointment, please contact [facility]  [phone] and follow the prompts.  Office hours are 8:00 a.m. to 4:30 p.m. Monday - Friday. Please note that voicemails left after 4:00 p.m. may not be returned until the following business day.  We are closed weekends and major holidays. You have access to a nurse at all times for urgent questions. Please call the main number to the clinic [phone] and follow the prompts.  For any non-urgent questions, you may also contact your provider using MyChart. We now offer e-Visits for anyone 18 and older to request care online for non-urgent symptoms. For details visit [email or web address].   Also download the MyChart app! Go to the app store, search "MyChart", open the app, select [facility], and log in with your MyChart username and password.  Due to Covid, a mask is required upon entering the hospital/clinic. If you do not have a mask, one will be given to you upon arrival. For doctor visits, patients may have 1 support person aged 18 or older with them. For treatment visits, patients cannot have anyone with them due to current Covid guidelines and our immunocompromised population.  

## 2021-07-01 NOTE — Progress Notes (Signed)
Per Sonia Baller, NP - hold tx today due to pt's elevated crt level and no blood return from port. 500 cc bolus infusing. Pt to go for chest xray today. Pt aware of plan, became tearful when told she would not receive tx as scheduled today.

## 2021-07-01 NOTE — Progress Notes (Signed)
Pt received 500cc NS bolus in clinic today. Tolerated well. Pt became tearful when told tx would be held due to elevated crt. This RN notified Teressa Lower, Dr Elroy Channel RN. Dr Janese Banks is out of clinic today. Per Sonia Baller NP, plan is for pt to return next week to received tx. This RN notified Colletta Maryland, scheduler to reschedule pts tx for next week and advised pt that she will be called with updated appts. Pt going for chest xray today. Ambulatory at d/c.

## 2021-07-02 LAB — CANCER ANTIGEN 27.29: CA 27.29: 14.1 U/mL (ref 0.0–38.6)

## 2021-07-04 ENCOUNTER — Telehealth: Payer: Self-pay | Admitting: *Deleted

## 2021-07-04 NOTE — Telephone Encounter (Signed)
Called daughter back and let her know that pt was concerned about not getting her chemo last week. The pt was new for the staff and her port did not draw back blood. Did chest xray and it was in the right place. Her creat. Was elevated and so they cancelled the treatment and got IVF to help with kidney function. The patient said that sometimes it does not draw back blood.  We will scheduled her next Friday but she will get the taxol, herceptin, perjeta. And she will need to be here at 8 am and told daughter to give her  a paper stating that daughter needs 30 min call to let her know to come get her mom and pt can give it to staff. We could do the appt next Thursday but daughter has moving people to her house. So Friday is best

## 2021-07-06 ENCOUNTER — Encounter: Payer: Medicare Other | Attending: Internal Medicine | Admitting: Internal Medicine

## 2021-07-06 ENCOUNTER — Other Ambulatory Visit: Payer: Self-pay

## 2021-07-06 DIAGNOSIS — I10 Essential (primary) hypertension: Secondary | ICD-10-CM

## 2021-07-06 DIAGNOSIS — C50919 Malignant neoplasm of unspecified site of unspecified female breast: Secondary | ICD-10-CM | POA: Diagnosis not present

## 2021-07-06 DIAGNOSIS — S81802A Unspecified open wound, left lower leg, initial encounter: Secondary | ICD-10-CM | POA: Diagnosis not present

## 2021-07-06 DIAGNOSIS — C79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: Secondary | ICD-10-CM

## 2021-07-06 DIAGNOSIS — L97929 Non-pressure chronic ulcer of unspecified part of left lower leg with unspecified severity: Secondary | ICD-10-CM | POA: Diagnosis present

## 2021-07-06 NOTE — Progress Notes (Signed)
Laura Santiago, Laura Santiago ([id number]) Visit Report for 07/06/2021 Abuse/Suicide Risk Screen Details Patient Name: Laura Santiago, Laura Santiago. Date of Service: 07/06/2021 10:00 AM Medical Record Number: [id number] Patient Account Number: [id number] Date of Birth/Sex: 14-Aug-1946 (75 y.o. Female) Treating RN: Dolan Amen Primary Care Genetta Fiero: SYSTEM, PCP Other Clinician: Referring Retha Bither: RAO, Astrid Divine Treating Jamielyn Petrucci/Extender: Yaakov Guthrie in Treatment: 0 Abuse/Suicide Risk Screen Items Answer ABUSE RISK SCREEN: Has anyone close to you tried to hurt or harm you recentlyo No Do you feel uncomfortable with anyone in your familyo No Has anyone forced you do things that you didnot want to doo No Electronic Signature(s) Signed: 07/06/2021 4:59:39 PM By: Dolan Amen RN Entered By: Dolan Amen on 07/06/2021 10:33:30 Laura Santiago ([id number]) -------------------------------------------------------------------------------- Activities of Daily Living Details Patient Name: Laura Santiago, Laura Santiago. Date of Service: 07/06/2021 10:00 AM Medical Record Number: [id number] Patient Account Number: [id number] Date of Birth/Sex: Sep 22, 1946 (75 y.o. Female) Treating RN: Dolan Amen Primary Care Camaria Gerald: SYSTEM, PCP Other Clinician: Referring Taavi Hoose: RAO, Astrid Divine Treating Dayvian Blixt/Extender: Yaakov Guthrie in Treatment: 0 Activities of Daily Living Items Answer Activities of Daily Living (Please select one for each item) Drive Automobile Not Able Take Medications Completely Able Use Telephone Completely Able Care for Appearance Completely Able Use Toilet Completely Able Bath / Shower Completely Able Dress Self Completely Able Feed Self Completely Able Walk Completely Able Get In / Out Bed Completely Able Housework Completely Able Prepare Meals Completely Able Handle Money Completely Able Shop for Self Completely Able Electronic Signature(s) Signed: 07/06/2021 4:59:39 PM By:  Dolan Amen RN Entered By: Dolan Amen on 07/06/2021 10:33:57 Laura Santiago ([id number]) -------------------------------------------------------------------------------- Education Screening Details Patient Name: Laura Santiago. Date of Service: 07/06/2021 10:00 AM Medical Record Number: [id number] Patient Account Number: [id number] Date of Birth/Sex: 09-Apr-1946 (75 y.o. Female) Treating RN: Dolan Amen Primary Care Preet Perrier: SYSTEM, PCP Other Clinician: Referring Glenden Rossell: RAO, Astrid Divine Treating Lary Eckardt/Extender: Yaakov Guthrie in Treatment: 0 Primary Learner Assessed: Patient Learning Preferences/Education Level/Primary Language Learning Preference: Explanation, Demonstration Highest Education Level: College or Above Preferred Language: English Cognitive Barrier Language Barrier: No Translator Needed: No Memory Deficit: No Emotional Barrier: No Cultural/Religious Beliefs Affecting Medical Care: No Physical Barrier Impaired Vision: No Impaired Hearing: No Decreased Hand dexterity: No Knowledge/Comprehension Knowledge Level: High Comprehension Level: High Ability to understand written instructions: High Ability to understand verbal instructions: High Motivation Anxiety Level: Calm Cooperation: Cooperative Education Importance: Acknowledges Need Interest in Health Problems: Asks Questions Perception: Coherent Willingness to Engage in Self-Management Medium Activities: Readiness to Engage in Self-Management Medium Activities: Electronic Signature(s) Signed: 07/06/2021 4:59:39 PM By: Dolan Amen RN Entered By: Dolan Amen on 07/06/2021 10:34:22 Laura Santiago, Laura Santiago ([id number]) -------------------------------------------------------------------------------- Fall Risk Assessment Details Patient Name: Laura Santiago. Date of Service: 07/06/2021 10:00 AM Medical Record Number: [id number] Patient Account Number: [id number] Date of Birth/Sex:  March 29, 1946 (76 y.o. Female) Treating RN: Dolan Amen Primary Care Chela Sutphen: SYSTEM, PCP Other Clinician: Referring Avaya Mcjunkins: RAO, Astrid Divine Treating Adolph Clutter/Extender: Yaakov Guthrie in Treatment: 0 Fall Risk Assessment Items Have you had 2 or more falls in the last 12 monthso 0 No Have you had any fall that resulted in injury in the last 12 monthso 0 No FALLS RISK SCREEN History of falling - immediate or within 3 months 0 No Secondary diagnosis (Do you have 2 or more medical diagnoseso) 15 Yes Ambulatory aid None/bed rest/wheelchair/nurse 0 No Crutches/cane/walker 15 Yes Furniture 0 No Intravenous therapy Access/Saline/Heparin Lock 0 No Gait/Transferring Normal/ bed rest/  wheelchair 0 No Weak (short steps with or without shuffle, stooped but able to lift head while walking, may 10 Yes seek support from furniture) Impaired (short steps with shuffle, may have difficulty arising from chair, head down, impaired 0 No balance) Mental Status Oriented to own ability 0 Yes Electronic Signature(s) Signed: 07/06/2021 4:59:39 PM By: Dolan Amen RN Entered By: Dolan Amen on 07/06/2021 10:34:37 Laura Santiago ([id number]) -------------------------------------------------------------------------------- Foot Assessment Details Patient Name: Laura Santiago. Date of Service: 07/06/2021 10:00 AM Medical Record Number: [id number] Patient Account Number: [id number] Date of Birth/Sex: [birth year] (75 y.o. Female) Treating RN: Dolan Amen Primary Care Aaleigha Bozza: SYSTEM, PCP Other Clinician: Referring Ulla Mckiernan: RAO, Astrid Divine Treating Neshawn Aird/Extender: Yaakov Guthrie in Treatment: 0 Foot Assessment Items Site Locations + = Sensation present, - = Sensation absent, C = Callus, U = Ulcer R = Redness, W = Warmth, M = Maceration, PU = Pre-ulcerative lesion F = Fissure, S = Swelling, D = Dryness Assessment Right: Left: Other Deformity: No No Prior Foot Ulcer: No  No Prior Amputation: No No Charcot Joint: No No Ambulatory Status: Ambulatory With Help Assistance Device: Cane Gait: Buyer, retail Signature(s) Signed: 07/06/2021 4:59:39 PM By: Dolan Amen RN Entered By: Dolan Amen on 07/06/2021 10:35:47 Laura Santiago ([id number]) -------------------------------------------------------------------------------- Nutrition Risk Screening Details Patient Name: Laura Santiago. Date of Service: 07/06/2021 10:00 AM Medical Record Number: [id number] Patient Account Number: [id number] Date of Birth/Sex: [birth year] (75 y.o. Female) Treating RN: Dolan Amen Primary Care Cashe Gatt: SYSTEM, PCP Other Clinician: Referring Avish Torry: RAO, Astrid Divine Treating Ronith Berti/Extender: Yaakov Guthrie in Treatment: 0 Height (in): 66 Weight (lbs): 153 Body Mass Index (BMI): 24.7 Nutrition Risk Screening Items Score Screening NUTRITION RISK SCREEN: I have an illness or condition that made me change the kind and/or amount of food I eat 0 No I eat fewer than two meals per day 0 No I eat few fruits and vegetables, or milk products 0 No I have three or more drinks of beer, liquor or wine almost every day 0 No I have tooth or mouth problems that make it hard for me to eat 0 No I don't always have enough money to buy the food I need 0 No I eat alone most of the time 0 No I take three or more different prescribed or over-the-counter drugs a day 1 Yes Without wanting to, I have lost or gained 10 pounds in the last six months 0 No I am not always physically able to shop, cook and/or feed myself 0 No Nutrition Protocols Good Risk Protocol 0 No interventions needed Moderate Risk Protocol High Risk Proctocol Risk Level: Good Risk Score: 1 Electronic Signature(s) Signed: 07/06/2021 4:59:39 PM By: Dolan Amen RN Entered By: Dolan Amen on 07/06/2021 10:35:37

## 2021-07-07 ENCOUNTER — Other Ambulatory Visit: Payer: Medicare Other

## 2021-07-07 ENCOUNTER — Ambulatory Visit: Payer: Medicare Other

## 2021-07-08 ENCOUNTER — Inpatient Hospital Stay: Payer: Medicare Other

## 2021-07-08 ENCOUNTER — Other Ambulatory Visit: Payer: Self-pay

## 2021-07-08 VITALS — BP 112/54 | HR 76 | Temp 97.6°F | Resp 18 | Wt 154.0 lb

## 2021-07-08 DIAGNOSIS — C50919 Malignant neoplasm of unspecified site of unspecified female breast: Secondary | ICD-10-CM

## 2021-07-08 DIAGNOSIS — Z5112 Encounter for antineoplastic immunotherapy: Secondary | ICD-10-CM | POA: Diagnosis not present

## 2021-07-08 LAB — CBC WITH DIFFERENTIAL/PLATELET
Abs Immature Granulocytes: 0.03 10*3/uL (ref 0.00–0.07)
Basophils Absolute: 0.1 10*3/uL (ref 0.0–0.1)
Basophils Relative: 1 %
Eosinophils Absolute: 0.4 10*3/uL (ref 0.0–0.5)
Eosinophils Relative: 6 %
HCT: 29.4 % — ABNORMAL LOW (ref 36.0–46.0)
Hemoglobin: 9.1 g/dL — ABNORMAL LOW (ref 12.0–15.0)
Immature Granulocytes: 1 %
Lymphocytes Relative: 19 %
Lymphs Abs: 1.1 10*3/uL (ref 0.7–4.0)
MCH: 28.5 pg (ref 26.0–34.0)
MCHC: 31 g/dL (ref 30.0–36.0)
MCV: 92.2 fL (ref 80.0–100.0)
Monocytes Absolute: 0.5 10*3/uL (ref 0.1–1.0)
Monocytes Relative: 9 %
Neutro Abs: 3.6 10*3/uL (ref 1.7–7.7)
Neutrophils Relative %: 64 %
Platelets: 270 10*3/uL (ref 150–400)
RBC: 3.19 MIL/uL — ABNORMAL LOW (ref 3.87–5.11)
RDW: 16.6 % — ABNORMAL HIGH (ref 11.5–15.5)
WBC: 5.6 10*3/uL (ref 4.0–10.5)
nRBC: 0 % (ref 0.0–0.2)

## 2021-07-08 LAB — COMPREHENSIVE METABOLIC PANEL
ALT: 14 U/L (ref 0–44)
AST: 24 U/L (ref 15–41)
Albumin: 3.8 g/dL (ref 3.5–5.0)
Alkaline Phosphatase: 72 U/L (ref 38–126)
Anion gap: 10 (ref 5–15)
BUN: 34 mg/dL — ABNORMAL HIGH (ref 8–23)
CO2: 22 mmol/L (ref 22–32)
Calcium: 8.6 mg/dL — ABNORMAL LOW (ref 8.9–10.3)
Chloride: 107 mmol/L (ref 98–111)
Creatinine, Ser: 1.97 mg/dL — ABNORMAL HIGH (ref 0.44–1.00)
GFR, Estimated: 26 mL/min — ABNORMAL LOW (ref >60)
Glucose, Bld: 105 mg/dL — ABNORMAL HIGH (ref 70–99)
Potassium: 4.3 mmol/L (ref 3.5–5.1)
Sodium: 139 mmol/L (ref 135–145)
Total Bilirubin: 0.3 mg/dL (ref 0.3–1.2)
Total Protein: 7.1 g/dL (ref 6.5–8.1)

## 2021-07-08 MED ORDER — SODIUM CHLORIDE 0.9 % IV SOLN
8.0000 mg/kg | Freq: Once | INTRAVENOUS | Status: DC
  Filled 2021-07-08: qty 26

## 2021-07-08 MED ORDER — HEPARIN SOD (PORK) LOCK FLUSH 100 UNIT/ML IV SOLN
500.0000 [IU] | Freq: Once | INTRAVENOUS | Status: AC | PRN
  Administered 2021-07-08: 500 [IU]
  Filled 2021-07-08: qty 5

## 2021-07-08 MED ORDER — ACETAMINOPHEN 325 MG PO TABS
650.0000 mg | ORAL_TABLET | Freq: Once | ORAL | Status: AC
  Administered 2021-07-08: 650 mg via ORAL
  Filled 2021-07-08: qty 2

## 2021-07-08 MED ORDER — TRASTUZUMAB-DKST CHEMO 150 MG IV SOLR
600.0000 mg | Freq: Once | INTRAVENOUS | Status: AC
  Administered 2021-07-08: 600 mg via INTRAVENOUS
  Filled 2021-07-08: qty 28.57

## 2021-07-08 MED ORDER — HEPARIN SOD (PORK) LOCK FLUSH 100 UNIT/ML IV SOLN
INTRAVENOUS | Status: AC
  Filled 2021-07-08: qty 5

## 2021-07-08 MED ORDER — SODIUM CHLORIDE 0.9 % IV SOLN
Freq: Once | INTRAVENOUS | Status: AC
  Filled 2021-07-08: qty 250

## 2021-07-08 MED ORDER — SODIUM CHLORIDE 0.9 % IV SOLN
420.0000 mg | Freq: Once | INTRAVENOUS | Status: AC
  Administered 2021-07-08: 420 mg via INTRAVENOUS
  Filled 2021-07-08: qty 14

## 2021-07-08 MED ORDER — DIPHENHYDRAMINE HCL 50 MG/ML IJ SOLN
50.0000 mg | Freq: Once | INTRAMUSCULAR | Status: AC
  Administered 2021-07-08: 50 mg via INTRAVENOUS
  Filled 2021-07-08: qty 1

## 2021-07-08 MED ORDER — SODIUM CHLORIDE 0.9 % IV SOLN
65.0000 mg/m2 | Freq: Once | INTRAVENOUS | Status: AC
  Administered 2021-07-08: 114 mg via INTRAVENOUS
  Filled 2021-07-08: qty 19

## 2021-07-08 MED ORDER — SODIUM CHLORIDE 0.9 % IV SOLN
10.0000 mg | Freq: Once | INTRAVENOUS | Status: AC
  Administered 2021-07-08: 10 mg via INTRAVENOUS
  Filled 2021-07-08: qty 10

## 2021-07-08 MED ORDER — FAMOTIDINE 20 MG IN NS 100 ML IVPB
20.0000 mg | Freq: Once | INTRAVENOUS | Status: AC
  Administered 2021-07-08: 20 mg via INTRAVENOUS
  Filled 2021-07-08: qty 100
  Filled 2021-07-08: qty 20

## 2021-07-08 MED ORDER — SODIUM CHLORIDE 0.9 % IV SOLN: 840.0000 mg | Freq: Once | INTRAVENOUS | Status: DC

## 2021-07-08 NOTE — Patient Instructions (Signed)
[facility]  Discharge Instructions: Thank you for choosing [facility] to provide your oncology and hematology care.  If you have a lab appointment with the [facility], please go directly to the [facility] and check in at the registration area.  Wear comfortable clothing and clothing appropriate for easy access to any Portacath or PICC line.   We strive to give you quality time with your provider. You may need to reschedule your appointment if you arrive late (15 or more minutes).  Arriving late affects you and other patients whose appointments are after yours.  Also, if you miss three or more appointments without notifying the office, you may be dismissed from the clinic at the provider's discretion.      For prescription refill requests, have your pharmacy contact our office and allow 72 hours for refills to be completed.    Today you received the following chemotherapy and/or immunotherapy agents : Herceptin / Perjeta / Taxol    To help prevent nausea and vomiting after your treatment, we encourage you to take your nausea medication as directed.  BELOW ARE SYMPTOMS THAT SHOULD BE REPORTED IMMEDIATELY: *FEVER GREATER THAN 100.4 F (38 C) OR HIGHER *CHILLS OR SWEATING *NAUSEA AND VOMITING THAT IS NOT CONTROLLED WITH YOUR NAUSEA MEDICATION *UNUSUAL SHORTNESS OF BREATH *UNUSUAL BRUISING OR BLEEDING *URINARY PROBLEMS (pain or burning when urinating, or frequent urination) *BOWEL PROBLEMS (unusual diarrhea, constipation, pain near the anus) TENDERNESS IN MOUTH AND THROAT WITH OR WITHOUT PRESENCE OF ULCERS (sore throat, sores in mouth, or a toothache) UNUSUAL RASH, SWELLING OR PAIN  UNUSUAL VAGINAL DISCHARGE OR ITCHING   Items with * indicate a potential emergency and should be followed up as soon as possible or go to the Emergency Department if any problems should occur.  Please show the CHEMOTHERAPY ALERT CARD or IMMUNOTHERAPY ALERT  CARD at check-in to the Emergency Department and triage nurse.  Should you have questions after your visit or need to cancel or reschedule your appointment, please contact [facility]  [phone] and follow the prompts.  Office hours are 8:00 a.m. to 4:30 p.m. Monday - Friday. Please note that voicemails left after 4:00 p.m. may not be returned until the following business day.  We are closed weekends and major holidays. You have access to a nurse at all times for urgent questions. Please call the main number to the clinic [phone] and follow the prompts.  For any non-urgent questions, you may also contact your provider using MyChart. We now offer e-Visits for anyone 19 and older to request care online for non-urgent symptoms. For details visit [email or web address].   Also download the MyChart app! Go to the app store, search "MyChart", open the app, select [facility], and log in with your MyChart username and password.  Due to Covid, a mask is required upon entering the hospital/clinic. If you do not have a mask, one will be given to you upon arrival. For doctor visits, patients may have 1 support person aged 77 or older with them. For treatment visits, patients cannot have anyone with them due to current Covid guidelines and our immunocompromised population.

## 2021-07-09 ENCOUNTER — Other Ambulatory Visit: Payer: Self-pay | Admitting: *Deleted

## 2021-07-09 MED ORDER — OXYCODONE HCL 10 MG PO TABS: 10.0000 mg | ORAL_TABLET | ORAL | 0 refills | Status: DC | PRN

## 2021-07-11 ENCOUNTER — Other Ambulatory Visit: Payer: Self-pay

## 2021-07-11 ENCOUNTER — Other Ambulatory Visit (INDEPENDENT_AMBULATORY_CARE_PROVIDER_SITE_OTHER): Payer: Self-pay | Admitting: Internal Medicine

## 2021-07-11 ENCOUNTER — Ambulatory Visit (INDEPENDENT_AMBULATORY_CARE_PROVIDER_SITE_OTHER): Payer: Medicare Other

## 2021-07-11 DIAGNOSIS — L97929 Non-pressure chronic ulcer of unspecified part of left lower leg with unspecified severity: Secondary | ICD-10-CM

## 2021-07-11 DIAGNOSIS — I872 Venous insufficiency (chronic) (peripheral): Secondary | ICD-10-CM | POA: Diagnosis not present

## 2021-07-13 ENCOUNTER — Other Ambulatory Visit: Payer: Self-pay

## 2021-07-13 ENCOUNTER — Encounter: Payer: Medicare Other | Attending: Internal Medicine | Admitting: Internal Medicine

## 2021-07-13 DIAGNOSIS — I872 Venous insufficiency (chronic) (peripheral): Secondary | ICD-10-CM | POA: Insufficient documentation

## 2021-07-13 DIAGNOSIS — X58XXXD Exposure to other specified factors, subsequent encounter: Secondary | ICD-10-CM | POA: Insufficient documentation

## 2021-07-13 DIAGNOSIS — I10 Essential (primary) hypertension: Secondary | ICD-10-CM | POA: Diagnosis not present

## 2021-07-13 DIAGNOSIS — Z452 Encounter for adjustment and management of vascular access device: Secondary | ICD-10-CM | POA: Diagnosis not present

## 2021-07-13 DIAGNOSIS — S81802D Unspecified open wound, left lower leg, subsequent encounter: Secondary | ICD-10-CM | POA: Diagnosis not present

## 2021-07-13 DIAGNOSIS — S81802A Unspecified open wound, left lower leg, initial encounter: Secondary | ICD-10-CM

## 2021-07-13 DIAGNOSIS — C79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: Secondary | ICD-10-CM | POA: Diagnosis not present

## 2021-07-13 DIAGNOSIS — Z7901 Long term (current) use of anticoagulants: Secondary | ICD-10-CM | POA: Insufficient documentation

## 2021-07-13 NOTE — Progress Notes (Signed)
CHELSEE, HOSIE ([id number]) Visit Report for 07/13/2021 Chief Complaint Document Details Patient Name: Laura Santiago, Laura Santiago. Date of Service: 07/13/2021 2:45 PM Medical Record Number: [id number] Patient Account Number: [id number] Date of Birth/Sex: [birth year] (75 y.o. F) Treating RN: Dolan Amen Primary Care Provider: SYSTEM, PCP Other Clinician: Referring Provider: RAO, Astrid Divine Treating Provider/Extender: Yaakov Guthrie in Treatment: 1 Information Obtained from: Patient Chief Complaint Left lower extremity wound Electronic Signature(s) Signed: 07/13/2021 4:27:41 PM By: Kalman Shan DO Entered By: Kalman Shan on 07/13/2021 16:14:35 Laura Santiago ([id number]) -------------------------------------------------------------------------------- Debridement Details Patient Name: Laura Santiago. Date of Service: 07/13/2021 2:45 PM Medical Record Number: [id number] Patient Account Number: [id number] Date of Birth/Sex: [birth year] (75 y.o. F) Treating RN: Dolan Amen Primary Care Provider: SYSTEM, PCP Other Clinician: Referring Provider: RAO, Astrid Divine Treating Provider/Extender: Yaakov Guthrie in Treatment: 1 Debridement Performed for Wound #1 Left,Medial Lower Leg Assessment: Performed By: Physician Kalman Shan, MD Debridement Type: Debridement Severity of Tissue Pre Debridement: Fat layer exposed Level of Consciousness (Pre- Awake and Alert procedure): Pre-procedure Verification/Time Out Yes - 15:58 Taken: Start Time: 15:58 Total Area Debrided (L x W): 8.4 (cm) x 9.5 (cm) = 79.8 (cm) Tissue and other material Viable, Non-Viable, Slough, Subcutaneous, Slough debrided: Level: Skin/Subcutaneous Tissue Debridement Description: Excisional Instrument: Curette Bleeding: Minimum Hemostasis Achieved: Pressure Response to Treatment: Procedure was tolerated well Level of Consciousness (Post- Awake and Alert procedure): Post Debridement Measurements of  Total Wound Length: (cm) 8.4 Width: (cm) 9.5 Depth: (cm) 0.3 Volume: (cm) 18.802 Character of Wound/Ulcer Post Debridement: Stable Severity of Tissue Post Debridement: Fat layer exposed Post Procedure Diagnosis Same as Pre-procedure Electronic Signature(s) Signed: 07/13/2021 4:27:41 PM By: Kalman Shan DO Signed: 07/13/2021 4:40:35 PM By: Dolan Amen RN Entered By: Dolan Amen on 07/13/2021 16:00:10 Laura Santiago ([id number]) -------------------------------------------------------------------------------- HPI Details Patient Name: Laura Santiago. Date of Service: 07/13/2021 2:45 PM Medical Record Number: [id number] Patient Account Number: [id number] Date of Birth/Sex: 1946-08-24 (75 y.o. F) Treating RN: Dolan Amen Primary Care Provider: SYSTEM, PCP Other Clinician: Referring Provider: RAO, Astrid Divine Treating Provider/Extender: Yaakov Guthrie in Treatment: 1 History of Present Illness HPI Description: Admission 7/27 Ms. Carleena Mires is a 75 year old female with a past medical history of ADHD, metastatic breast cancer, stage IV chronic kidney disease, history of DVT on Xarelto and chronic venous insufficiency that presents to the clinic for a chronic left lower extremity wound. She recently moved to [facility] 4 days ago. She was being followed by wound care center in [name]. She reports a 10-year history of wounds to her left lower extremity that eventually do heal with debridement and compression therapy. She states that the current wound reopened 4 months ago and she is using Vaseline and Coban. She denies signs of infection. 8/3; patient presents for 1 week follow-up. She reports no issues or complaints today. She states she had vascular studies done in the last week. She denies signs of infection. She brought her little service dog with her today. Electronic Signature(s) Signed: 07/13/2021 4:27:41 PM By: Kalman Shan DO Entered By:  Kalman Shan on 07/13/2021 16:15:30 Laura Santiago ([id number]) -------------------------------------------------------------------------------- Physical Exam Details Patient Name: Laura, Santiago. Date of Service: 07/13/2021 2:45 PM Medical Record Number: [id number] Patient Account Number: [id number] Date of Birth/Sex: Dec 15, 1945 (75 y.o. F) Treating RN: Dolan Amen Primary Care Provider: SYSTEM, PCP Other Clinician: Referring Provider: RAO, Astrid Divine Treating Provider/Extender: Yaakov Guthrie in Treatment: 1 Constitutional . Cardiovascular . Psychiatric . Notes Left lower extremity:  Open wounds to the medial aspect with slough and fibrinous tissue. No obvious signs of infection. Electronic Signature(s) Signed: 07/13/2021 4:27:41 PM By: Kalman Shan DO Entered By: Kalman Shan on 07/13/2021 16:16:11 Laura Santiago ([id number]) -------------------------------------------------------------------------------- Physician Orders Details Patient Name: Laura Santiago. Date of Service: 07/13/2021 2:45 PM Medical Record Number: [id number] Patient Account Number: [id number] Date of Birth/Sex: 06/09/1946 (75 y.o. F) Treating RN: Dolan Amen Primary Care Provider: SYSTEM, PCP Other Clinician: Referring Provider: RAO, Astrid Divine Treating Provider/Extender: Yaakov Guthrie in Treatment: 1 Verbal / Phone Orders: No Diagnosis Coding Follow-up Appointments o Return Appointment in 1 week. Bathing/ Shower/ Hygiene o May shower with wound dressing protected with water repellent cover or cast protector. o No tub bath. Wound Treatment Wound #1 - Lower Leg Wound Laterality: Left, Medial Cleanser: Soap and Water 1 x Per Week/30 Days Discharge Instructions: Gently cleanse wound with antibacterial soap, rinse and pat dry prior to dressing wounds Topical: Gentamicin 1 x Per Week/30 Days Discharge Instructions: Apply as directed by provider. Topical: Santyl  Collagenase Ointment, 30 (gm), tube 1 x Per Week/30 Days Discharge Instructions: Apply nickel thick to wound bed only Secondary Dressing: Hydrofera Blue Ready Transfer Foam, 4x5 (in/in) 1 x Per Week/30 Days Discharge Instructions: Apply to wound bed over non-stick dressing. Secondary Dressing: Xtrasorb Large 6x9 (in/in) 1 x Per Week/30 Days Discharge Instructions: Apply to wound as directed. Do not cut. Compression Wrap: Profore Lite LF 3 Multilayer Compression Bandaging System 1 x Per Week/30 Days Discharge Instructions: Apply 3 multi-layer wrap as prescribed. Electronic Signature(s) Signed: 07/13/2021 4:27:41 PM By: Kalman Shan DO Signed: 07/13/2021 4:40:35 PM By: Dolan Amen RN Entered By: Dolan Amen on 07/13/2021 16:02:01 YANA, SCHORR ([id number]) -------------------------------------------------------------------------------- Problem List Details Patient Name: KYRIE, BUN. Date of Service: 07/13/2021 2:45 PM Medical Record Number: [id number] Patient Account Number: [id number] Date of Birth/Sex: 11/19/1946 (75 y.o. F) Treating RN: Dolan Amen Primary Care Provider: SYSTEM, PCP Other Clinician: Referring Provider: RAO, Astrid Divine Treating Provider/Extender: Yaakov Guthrie in Treatment: 1 Active Problems ICD-10 Encounter Code Description Active Date MDM Diagnosis S81.802D Unspecified open wound, left lower leg, subsequent encounter 07/13/2021 No Yes I87.2 Venous insufficiency (chronic) (peripheral) 07/06/2021 No Yes 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 07/06/2021 No Yes I10 Essential (primary) hypertension 07/06/2021 No Yes Z79.01 Long term (current) use of anticoagulants 07/06/2021 No Yes Inactive Problems ICD-10 Code Description Active Date Inactive Date S81.802A Unspecified open wound, left lower leg, initial encounter 07/06/2021 07/06/2021 Resolved Problems Electronic Signature(s) Signed: 07/13/2021 4:27:41 PM By: Kalman Shan DO Entered By:  Kalman Shan on 07/13/2021 16:14:11 Laura Santiago ([id number]) -------------------------------------------------------------------------------- Progress Note Details Patient Name: Laura Santiago. Date of Service: 07/13/2021 2:45 PM Medical Record Number: [id number] Patient Account Number: [id number] Date of Birth/Sex: Dec 11, 1946 (75 y.o. F) Treating RN: Dolan Amen Primary Care Provider: SYSTEM, PCP Other Clinician: Referring Provider: RAO, Astrid Divine Treating Provider/Extender: Yaakov Guthrie in Treatment: 1 Subjective Chief Complaint Information obtained from Patient Left lower extremity wound History of Present Illness (HPI) Admission 7/27 Ms. Winni Ehrhard is a 75 year old female with a past medical history of ADHD, metastatic breast cancer, stage IV chronic kidney disease, history of DVT on Xarelto and chronic venous insufficiency that presents to the clinic for a chronic left lower extremity wound. She recently moved to [facility] 4 days ago. She was being followed by wound care center in [name]. She reports a 10-year history of wounds to her left lower extremity that eventually do heal with  debridement and compression therapy. She states that the current wound reopened 4 months ago and she is using Vaseline and Coban. She denies signs of infection. 8/3; patient presents for 1 week follow-up. She reports no issues or complaints today. She states she had vascular studies done in the last week. She denies signs of infection. She brought her little service dog with her today. Patient History Information obtained from Patient. Social History Never smoker. Medical History Eyes Denies history of Cataracts, Glaucoma, Optic Neuritis Ear/Nose/Mouth/Throat Denies history of Chronic sinus problems/congestion, Middle ear problems Hematologic/Lymphatic Denies history of Anemia, Hemophilia, Human Immunodeficiency Virus, Lymphedema, Sickle Cell  Disease Respiratory Denies history of Aspiration, Asthma, Chronic Obstructive Pulmonary Disease (COPD), Pneumothorax, Sleep Apnea, Tuberculosis Cardiovascular Patient has history of Hypertension Denies history of Angina, Arrhythmia, Congestive Heart Failure, Coronary Artery Disease, Deep Vein Thrombosis, Hypotension, Myocardial Infarction, Peripheral Arterial Disease, Peripheral Venous Disease, Phlebitis, Vasculitis Gastrointestinal Denies history of Cirrhosis , Colitis, Crohn s, Hepatitis A, Hepatitis B, Hepatitis C Endocrine Denies history of Type I Diabetes, Type II Diabetes Genitourinary Denies history of End Stage Renal Disease Immunological Denies history of Lupus Erythematosus, Raynaud s, Scleroderma Integumentary (Skin) Denies history of History of Burn, History of pressure wounds Musculoskeletal Patient has history of Osteoarthritis Denies history of Gout, Rheumatoid Arthritis, Osteomyelitis Oncologic Patient has history of Received Chemotherapy, Received Radiation Medical And Surgical History Notes Oncologic breast cancer JAIMA, JANNEY. ([id number]) Objective Constitutional Vitals Time Taken: 3:23 PM, Height: 66 in, Weight: 153 lbs, BMI: 24.7, Temperature: 98.4 F, Pulse: 87 bpm, Respiratory Rate: 18 breaths/min, Blood Pressure: 160/73 mmHg. General Notes: Left lower extremity: Open wounds to the medial aspect with slough and fibrinous tissue. No obvious signs of infection. Integumentary (Hair, Skin) Wound #1 status is Open. Original cause of wound was Gradually Appeared. The date acquired was: 04/06/2021. The wound has been in treatment 1 weeks. The wound is located on the Left,Medial Lower Leg. The wound measures 8.4cm length x 9.5cm width x 0.2cm depth; 62.675cm^2 area and 12.535cm^3 volume. There is Fat Layer (Subcutaneous Tissue) exposed. There is no tunneling or undermining noted. There is a large amount of serosanguineous drainage noted. There is small (1-33%)  red granulation within the wound bed. There is a large (67-100%) amount of necrotic tissue within the wound bed including Adherent Slough. Assessment Active Problems ICD-10 Unspecified open wound, left lower leg, subsequent encounter Venous insufficiency (chronic) (peripheral) Secondary malignant neoplasm of breast Essential (primary) hypertension Long term (current) use of anticoagulants Patient's wounds show improvement in size and appearance since last clinic visit. I debrided nonviable tissue. No obvious signs of infection on exam. ABIs were normal. I recommended continuing Hydrofera Blue with Santyl And increasing the compression to 3 layer. She still has some minimal green drainage and I would like to continue the gentamicin ointment under the wrap since she has done well. 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 There was a Excisional Skin/Subcutaneous Tissue Debridement with a total area of 79.8 sq cm performed by Kalman Shan, MD. With the following instrument(s): Curette to remove Viable and Non-Viable tissue/material. Material removed includes Subcutaneous Tissue and Slough and. A time out was conducted at 15:58, prior to the start of the procedure. A Minimum amount of bleeding was controlled with Pressure. The procedure was tolerated well. Post Debridement Measurements: 8.4cm length x 9.5cm width x 0.3cm depth; 18.802cm^3 volume. Character of  Wound/Ulcer Post Debridement is stable. Severity of Tissue Post Debridement is: Fat layer exposed. 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 There was a Three Layer Compression Therapy Procedure with a pre-treatment ABI of 1.5 by Dolan Amen, RN. Post procedure Diagnosis Wound #1: Same as  Pre-Procedure Plan Follow-up Appointments: Return Appointment in 1 week. Bathing/ Shower/ Hygiene: May shower with wound dressing protected with water repellent cover or cast protector. No tub bath. WOUND #1: - Lower Leg Wound Laterality: Left, Medial Cleanser: Soap and Water 1 x Per Week/30 Days Discharge Instructions: Gently cleanse wound with antibacterial soap, rinse and pat dry prior to dressing wounds Topical: Gentamicin 1 x Per Week/30 Days Discharge Instructions: Apply as directed by provider. SUZZETTE, GASPARRO ([id number]) Topical: Santyl Collagenase Ointment, 30 (gm), tube 1 x Per Week/30 Days Discharge Instructions: Apply nickel thick to wound bed only Secondary Dressing: Hydrofera Blue Ready Transfer Foam, 4x5 (in/in) 1 x Per Week/30 Days Discharge Instructions: Apply to wound bed over non-stick dressing. Secondary Dressing: Xtrasorb Large 6x9 (in/in) 1 x Per Week/30 Days Discharge Instructions: Apply to wound as directed. Do not cut. Compression Wrap: Profore Lite LF 3 Multilayer Compression Bandaging System 1 x Per Week/30 Days Discharge Instructions: Apply 3 multi-layer wrap as prescribed. 1. Hydrofera Blue, Santyl, gentamicin under 3-layer compression 2. Follow-up next week for nurse visit in 2 weeks with me 3. In office sharp debridement Electronic Signature(s) Signed: 07/13/2021 4:27:41 PM By: Kalman Shan DO Entered By: Kalman Shan on 07/13/2021 16:25:03 Laura Santiago ([id number]) -------------------------------------------------------------------------------- ROS/PFSH Details Patient Name: Laura Santiago. Date of Service: 07/13/2021 2:45 PM Medical Record Number: [id number] Patient Account Number: [id number] Date of Birth/Sex: 25-May-1946 (75 y.o. F) Treating RN: Dolan Amen Primary Care Provider: SYSTEM, PCP Other Clinician: Referring Provider: RAO, Astrid Divine Treating Provider/Extender: Yaakov Guthrie in Treatment: 1 Information Obtained  From Patient Eyes Medical History: Negative for: Cataracts; Glaucoma; Optic Neuritis Ear/Nose/Mouth/Throat Medical History: Negative for: Chronic sinus problems/congestion; Middle ear problems Hematologic/Lymphatic Medical History: Negative for: Anemia; Hemophilia; Human Immunodeficiency Virus; Lymphedema; Sickle Cell Disease Respiratory Medical History: Negative for: Aspiration; Asthma; Chronic Obstructive Pulmonary Disease (COPD); Pneumothorax; Sleep Apnea; Tuberculosis Cardiovascular Medical History: Positive for: Hypertension Negative for: Angina; Arrhythmia; Congestive Heart Failure; Coronary Artery Disease; Deep Vein Thrombosis; Hypotension; Myocardial Infarction; Peripheral Arterial Disease; Peripheral Venous Disease; Phlebitis; Vasculitis Gastrointestinal Medical History: Negative for: Cirrhosis ; Colitis; Crohnos; Hepatitis A; Hepatitis B; Hepatitis C Endocrine Medical History: Negative for: Type I Diabetes; Type II Diabetes Genitourinary Medical History: Negative for: End Stage Renal Disease Immunological Medical History: Negative for: Lupus Erythematosus; Raynaudos; Scleroderma Integumentary (Skin) Medical History: Negative for: History of Burn; History of pressure wounds Musculoskeletal Medical History: Positive for: Osteoarthritis AMARANTA, MEHL ([id number]) Negative for: Gout; Rheumatoid Arthritis; Osteomyelitis Oncologic Medical History: Positive for: Received Chemotherapy; Received Radiation Past Medical History Notes: breast cancer Immunizations Pneumococcal Vaccine: Received Pneumococcal Vaccination: No Implantable Devices None Family and Social History Never smoker Electronic Signature(s) Signed: 07/13/2021 4:27:41 PM By: Kalman Shan DO Signed: 07/13/2021 4:40:35 PM By: Dolan Amen RN Entered By: Kalman Shan on 07/13/2021 16:15:38 BARBY, COLVARD  ([id number]) -------------------------------------------------------------------------------- [place] Details Patient Name: Laura Santiago. Date of Service: 07/13/2021 Medical Record Number: [id number] Patient Account Number: [id number] Date of Birth/Sex: 1946/04/19 (75 y.o. F) Treating RN: Dolan Amen Primary Care Provider: SYSTEM, PCP Other Clinician: Referring Provider: RAO, Astrid Divine Treating Provider/Extender: Yaakov Guthrie in Treatment:  1 Diagnosis Coding ICD-10 Codes Code Description S81.802A Unspecified open wound, left lower leg, initial encounter I10 Essential (primary) hypertension 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 Facility Procedures CPT4 Code: 25498264 Description: 15830 - DEB SUBQ TISSUE 20 SQ CM/< Modifier: Quantity: 1 CPT4 Code: Description: ICD-10 Diagnosis Description S81.802A Unspecified open wound, left lower leg, initial encounter Modifier: Quantity: CPT4 Code: 94076808 Description: 81103 - DEB SUBQ TISS EA ADDL 20CM Modifier: Quantity: 3 CPT4 Code: Description: ICD-10 Diagnosis Description S81.802A Unspecified open wound, left lower leg, initial encounter Modifier: Quantity: Physician Procedures CPT4 Code: 1594585 Description: 11042 - WC PHYS SUBQ TISS 20 SQ CM Modifier: Quantity: 1 CPT4 Code: Description: ICD-10 Diagnosis Description S81.802A Unspecified open wound, left lower leg, initial encounter Modifier: Quantity: CPT4 Code: 9292446 Description: 28638 - WC PHYS SUBQ TISS EA ADDL 20 CM Modifier: Quantity: 3 CPT4 Code: Description: ICD-10 Diagnosis Description S81.802A Unspecified open wound, left lower leg, initial encounter Modifier: Quantity: Electronic Signature(s) Signed: 07/13/2021 4:27:41 PM By: Kalman Shan DO Entered By: Kalman Shan on 07/13/2021 16:25:10

## 2021-07-13 NOTE — Progress Notes (Signed)
TALLI, KIMMER ([id number]) Visit Report for 07/06/2021 Chief Complaint Document Details Patient Name: Laura Santiago, Laura Santiago. Date of Service: 07/06/2021 10:00 AM Medical Record Number: [id number] Patient Account Number: [id number] Date of Birth/Sex: 1946-03-19 (75 y.o. F) Treating RN: Dolan Amen Primary Care Provider: SYSTEM, PCP Other Clinician: Referring Provider: RAO, Astrid Divine Treating Provider/Extender: Yaakov Guthrie in Treatment: 0 Information Obtained from: Patient Chief Complaint Left lower extremity wound Electronic Signature(s) Signed: 07/13/2021 4:42:29 PM By: Kalman Shan DO Entered By: Kalman Shan on 07/06/2021 13:47:46 Laura Santiago ([id number]) -------------------------------------------------------------------------------- Debridement Details Patient Name: Laura Santiago. Date of Service: 07/06/2021 10:00 AM Medical Record Number: [id number] Patient Account Number: [id number] Date of Birth/Sex: Aug 26, 1946 (75 y.o. F) Treating RN: Dolan Amen Primary Care Provider: SYSTEM, PCP Other Clinician: Referring Provider: RAO, Astrid Divine Treating Provider/Extender: Yaakov Guthrie in Treatment: 0 Debridement Performed for Wound #1 Left,Medial Lower Leg Assessment: Performed By: Physician Kalman Shan, MD Debridement Type: Debridement Severity of Tissue Pre Debridement: Fat layer exposed Level of Consciousness (Pre- Awake and Alert procedure): Pre-procedure Verification/Time Out Yes - 11:13 Taken: Start Time: 11:13 Total Area Debrided (L x W): 11.5 (cm) x 10 (cm) = 115 (cm) Tissue and other material Non-Viable, Slough, Subcutaneous, Slough debrided: Level: Skin/Subcutaneous Tissue Debridement Description: Excisional Instrument: Curette Bleeding: Minimum Hemostasis Achieved: Pressure Response to Treatment: Procedure was tolerated well Level of Consciousness (Post- Awake and Alert procedure): Post Debridement Measurements of  Total Wound Length: (cm) 11.5 Width: (cm) 10 Depth: (cm) 0.2 Volume: (cm) 18.064 Character of Wound/Ulcer Post Debridement: Stable Severity of Tissue Post Debridement: Fat layer exposed Post Procedure Diagnosis Same as Pre-procedure Electronic Signature(s) Signed: 07/06/2021 4:59:39 PM By: Dolan Amen RN Signed: 07/13/2021 4:42:29 PM By: Kalman Shan DO Entered By: Dolan Amen on 07/06/2021 11:18:51 Laura Santiago ([id number]) -------------------------------------------------------------------------------- HPI Details Patient Name: Laura Santiago. Date of Service: 07/06/2021 10:00 AM Medical Record Number: [id number] Patient Account Number: [id number] Date of Birth/Sex: 05-Mar-1946 (75 y.o. F) Treating RN: Dolan Amen Primary Care Provider: SYSTEM, PCP Other Clinician: Referring Provider: RAO, Astrid Divine Treating Provider/Extender: Yaakov Guthrie in Treatment: 0 History of Present Illness HPI Description: Admission 7/27 Ms. Leira Regino is a 75 year old female with a past medical history of ADHD, metastatic breast cancer, stage IV chronic kidney disease, history of DVT on Xarelto and chronic venous insufficiency that presents to the clinic for a chronic left lower extremity wound. She recently moved to [facility] 4 days ago. She was being followed by wound care center in [name]. She reports a 10-year history of wounds to her left lower extremity that eventually do heal with debridement and compression therapy. She states that the current wound reopened 4 months ago and she is using Vaseline and Coban. She denies signs of infection. Electronic Signature(s) Signed: 07/13/2021 4:42:29 PM By: Kalman Shan DO Entered By: Kalman Shan on 07/06/2021 13:50:12 Laura Santiago ([id number]) -------------------------------------------------------------------------------- Physical Exam Details Patient Name: Laura, Santiago. Date of Service:  07/06/2021 10:00 AM Medical Record Number: [id number] Patient Account Number: [id number] Date of Birth/Sex: June 15, 1946 (75 y.o. F) Treating RN: Dolan Amen Primary Care Provider: SYSTEM, PCP Other Clinician: Referring Provider: RAO, Astrid Divine Treating Provider/Extender: Yaakov Guthrie in Treatment: 0 Constitutional . Cardiovascular . Psychiatric . Notes Left lower extremity: Open wounds to the medial aspect with slough and fibrinous tissue. No obvious signs of infection. Electronic Signature(s) Signed: 07/13/2021 4:42:29 PM By: Kalman Shan DO Entered By: Kalman Shan on 07/06/2021 13:50:43 Laura Santiago ([id number]) -------------------------------------------------------------------------------- Physician Orders  Details Patient Name: Santiago, [name]. Date of Service: 07/06/2021 10:00 AM Medical Record Number: [id number] Patient Account Number: [id number] Date of Birth/Sex: 1945/12/31 (75 y.o. F) Treating RN: Dolan Amen Primary Care Provider: SYSTEM, PCP Other Clinician: Referring Provider: RAO, Astrid Divine Treating Provider/Extender: Yaakov Guthrie in Treatment: 0 Verbal / Phone Orders: No Diagnosis Coding ICD-10 Coding Code Description S81.802A Unspecified open wound, left lower leg, initial encounter I10 Essential (primary) hypertension 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 Follow-up Appointments o Return Appointment in 1 week. Bathing/ Shower/ Hygiene o May shower with wound dressing protected with water repellent cover or cast protector. o No tub bath. Wound Treatment Wound #1 - Lower Leg Wound Laterality: Left, Medial Cleanser: Soap and Water 1 x Per Week/30 Days Discharge Instructions: Gently cleanse wound with antibacterial soap, rinse and pat dry prior to dressing wounds Topical: Gentamicin 1 x Per Week/30 Days Discharge Instructions: Apply as directed by provider. Topical: Santyl Collagenase Ointment, 30 (gm), tube 1 x  Per Week/30 Days Discharge Instructions: Apply nickel thick to wound bed only Secondary Dressing: ABD Pad 5x9 (in/in) 1 x Per Week/30 Days Discharge Instructions: Cover with ABD pad Secondary Dressing: Hydrofera Blue Ready Transfer Foam, 4x5 (in/in) 1 x Per Week/30 Days Discharge Instructions: Apply to wound bed over non-stick dressing. Secured With: Coban Cohesive Bandage 4x5 (yds) Stretched 1 x Per Week/30 Days Discharge Instructions: Apply Coban as directed. Secured With: [employer] or Non-Sterile 6-ply 4.5x4 (yd/yd) 1 x Per Week/30 Days Discharge Instructions: Apply Kerlix as directed Services and Therapies o Arterial Studies- Bilateral - AVVS Electronic Signature(s) Signed: 07/06/2021 4:59:39 PM By: Dolan Amen RN Signed: 07/13/2021 4:42:29 PM By: Kalman Shan DO Entered By: Dolan Amen on 07/06/2021 11:21:07 Laura Santiago ([id number]) -------------------------------------------------------------------------------- Problem List Details Patient Name: GABREILLE, DARDIS. Date of Service: 07/06/2021 10:00 AM Medical Record Number: [id number] Patient Account Number: [id number] Date of Birth/Sex: 23-Mar-1946 (75 y.o. F) Treating RN: Dolan Amen Primary Care Provider: SYSTEM, PCP Other Clinician: Referring Provider: RAO, Astrid Divine Treating Provider/Extender: Yaakov Guthrie in Treatment: 0 Active Problems ICD-10 Encounter Code Description Active Date MDM Diagnosis S81.802A Unspecified open wound, left lower leg, initial encounter 07/06/2021 No Yes I87.2 Venous insufficiency (chronic) (peripheral) 07/06/2021 No Yes 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 07/06/2021 No Yes I10 Essential (primary) hypertension 07/06/2021 No Yes Z79.01 Long term (current) use of anticoagulants 07/06/2021 No Yes Inactive Problems Resolved Problems Electronic Signature(s) Signed: 07/13/2021 4:42:29 PM By: Kalman Shan DO Entered By: Kalman Shan on 07/06/2021  13:47:16 Laura Santiago ([id number]) -------------------------------------------------------------------------------- Progress Note Details Patient Name: Laura Santiago. Date of Service: 07/06/2021 10:00 AM Medical Record Number: [id number] Patient Account Number: [id number] Date of Birth/Sex: 09/28/1946 (75 y.o. F) Treating RN: Dolan Amen Primary Care Provider: SYSTEM, PCP Other Clinician: Referring Provider: RAO, Astrid Divine Treating Provider/Extender: Yaakov Guthrie in Treatment: 0 Subjective Chief Complaint Information obtained from Patient Left lower extremity wound History of Present Illness (HPI) Admission 7/27 Ms. Inetta Dicke is a 75 year old female with a past medical history of ADHD, metastatic breast cancer, stage IV chronic kidney disease, history of DVT on Xarelto and chronic venous insufficiency that presents to the clinic for a chronic left lower extremity wound. She recently moved to [facility] 4 days ago. She was being followed by wound care center in [name]. She reports a 10-year history of wounds to her left lower extremity that eventually do heal with debridement and compression therapy. She states that the current wound reopened 4 months  ago and she is using Vaseline and Coban. She denies signs of infection. Patient History Information obtained from Patient. Allergies Celebrex Social History Never smoker. Medical History Eyes Denies history of Cataracts, Glaucoma, Optic Neuritis Ear/Nose/Mouth/Throat Denies history of Chronic sinus problems/congestion, Middle ear problems Hematologic/Lymphatic Denies history of Anemia, Hemophilia, Human Immunodeficiency Virus, Lymphedema, Sickle Cell Disease Respiratory Denies history of Aspiration, Asthma, Chronic Obstructive Pulmonary Disease (COPD), Pneumothorax, Sleep Apnea, Tuberculosis Cardiovascular Patient has history of Hypertension Denies history of Angina, Arrhythmia, Congestive Heart  Failure, Coronary Artery Disease, Deep Vein Thrombosis, Hypotension, Myocardial Infarction, Peripheral Arterial Disease, Peripheral Venous Disease, Phlebitis, Vasculitis Gastrointestinal Denies history of Cirrhosis , Colitis, Crohn s, Hepatitis A, Hepatitis B, Hepatitis C Endocrine Denies history of Type I Diabetes, Type II Diabetes Genitourinary Denies history of End Stage Renal Disease Immunological Denies history of Lupus Erythematosus, Raynaud s, Scleroderma Integumentary (Skin) Denies history of History of Burn, History of pressure wounds Musculoskeletal Patient has history of Osteoarthritis Denies history of Gout, Rheumatoid Arthritis, Osteomyelitis Oncologic Patient has history of Received Chemotherapy, Received Radiation Medical And Surgical History Notes Oncologic breast cancer Review of Systems (ROS) Constitutional Symptoms (General Health) Denies complaints or symptoms of Fatigue, Fever, Chills, Marked Weight Change. Eyes Denies complaints or symptoms of Dry Eyes, Vision Changes, Glasses / Contacts. Ear/Nose/Mouth/Throat Denies complaints or symptoms of Difficult clearing ears, Sinusitis. Hematologic/Lymphatic Denies complaints or symptoms of Bleeding / Clotting Disorders, Human Immunodeficiency Virus. BECCA, BAYNE. ([id number]) Respiratory Denies complaints or symptoms of Chronic or frequent coughs, Shortness of Breath. Cardiovascular Complains or has symptoms of LE edema. Denies complaints or symptoms of Chest pain. Gastrointestinal Denies complaints or symptoms of Frequent diarrhea, Nausea, Vomiting. Endocrine Denies complaints or symptoms of Hepatitis, Thyroid disease, Polydypsia (Excessive Thirst). Genitourinary Denies complaints or symptoms of Kidney failure/ Dialysis, Incontinence/dribbling. Immunological Denies complaints or symptoms of Hives, Itching. Integumentary (Skin) Complains or has symptoms of Wounds. Denies complaints or symptoms of  Bleeding or bruising tendency, Breakdown, Swelling. Musculoskeletal Denies complaints or symptoms of Muscle Pain, Muscle Weakness. Psychiatric Complains or has symptoms of Anxiety. Objective Constitutional Vitals Time Taken: 10:22 AM, Height: 66 in, Source: Stated, Weight: 153 lbs, Source: Measured, BMI: 24.7, Temperature: 98.5 F, Pulse: 84 bpm, Respiratory Rate: 18 breaths/min, Blood Pressure: 126/78 mmHg. General Notes: Left lower extremity: Open wounds to the medial aspect with slough and fibrinous tissue. No obvious signs of infection. Integumentary (Hair, Skin) Wound #1 status is Open. Original cause of wound was Gradually Appeared. The date acquired was: 04/06/2021. The wound is located on the Left,Medial Lower Leg. The wound measures 11.5cm length x 10cm width x 0.2cm depth; 90.321cm^2 area and 18.064cm^3 volume. There is Fat Layer (Subcutaneous Tissue) exposed. There is no tunneling or undermining noted. There is a large amount of serosanguineous drainage noted. There is small (1-33%) red granulation within the wound bed. There is a large (67-100%) amount of necrotic tissue within the wound bed including Adherent Slough. Assessment Active Problems ICD-10 Unspecified open wound, left lower leg, initial encounter Venous insufficiency (chronic) (peripheral) Secondary malignant neoplasm of breast Essential (primary) hypertension Long term (current) use of anticoagulants Patient presents with a chronically nonhealing wound to her left lower extremity. She used to follow-up with the wound care center in [name] and we will try to obtain these notes. She reports improvement with compression therapy and debridements. This is likely a venous wound. Her ABIs were noncompressible and I would like to obtain formal ones with TBI's. I debrided nonviable tissue. There was slight green drainage on  her dressing. For now we will use Hydrofera Blue and gentamicin under Kerlix/Coban. Follow-up in 1  week Procedures Wound #1 Pre-procedure diagnosis of Wound #1 is a Venous Leg Ulcer located on the Left,Medial Lower Leg .Severity of Tissue Pre Debridement is: Fat layer exposed. There was a Excisional Skin/Subcutaneous Tissue Debridement with a total area of 115 sq cm performed by Kalman Shan, MD. With Laura Santiago ([id number]) the following instrument(s): Curette to remove Non-Viable tissue/material. Material removed includes Subcutaneous Tissue and Slough and. A time out was conducted at 11:13, prior to the start of the procedure. A Minimum amount of bleeding was controlled with Pressure. The procedure was tolerated well. Post Debridement Measurements: 11.5cm length x 10cm width x 0.2cm depth; 18.064cm^3 volume. Character of Wound/Ulcer Post Debridement is stable. Severity of Tissue Post Debridement is: Fat layer exposed. Post procedure Diagnosis Wound #1: Same as Pre-Procedure Plan Follow-up Appointments: Return Appointment in 1 week. Bathing/ Shower/ Hygiene: May shower with wound dressing protected with water repellent cover or cast protector. No tub bath. Services and Therapies ordered were: Arterial Studies- Bilateral - AVVS WOUND #1: - Lower Leg Wound Laterality: Left, Medial Cleanser: Soap and Water 1 x Per Week/30 Days Discharge Instructions: Gently cleanse wound with antibacterial soap, rinse and pat dry prior to dressing wounds Topical: Gentamicin 1 x Per Week/30 Days Discharge Instructions: Apply as directed by provider. Topical: Santyl Collagenase Ointment, 30 (gm), tube 1 x Per Week/30 Days Discharge Instructions: Apply nickel thick to wound bed only Secondary Dressing: ABD Pad 5x9 (in/in) 1 x Per Week/30 Days Discharge Instructions: Cover with ABD pad Secondary Dressing: Hydrofera Blue Ready Transfer Foam, 4x5 (in/in) 1 x Per Week/30 Days Discharge Instructions: Apply to wound bed over non-stick dressing. Secured With: Coban Cohesive Bandage 4x5 (yds)  Stretched 1 x Per Week/30 Days Discharge Instructions: Apply Coban as directed. Secured With: [employer] or Non-Sterile 6-ply 4.5x4 (yd/yd) 1 x Per Week/30 Days Discharge Instructions: Apply Kerlix as directed 1. In office sharp debridement 2. Hydrofera Blue with gentamicin under Kerlix/Coban 3. Formal ABIs with TBI's 4. Obtain records from [facility] wound care center Electronic Signature(s) Signed: 07/13/2021 4:42:29 PM By: Kalman Shan DO Entered By: Kalman Shan on 07/06/2021 17:06:57 Laura Santiago ([id number]) -------------------------------------------------------------------------------- ROS/PFSH Details Patient Name: Laura Santiago. Date of Service: 07/06/2021 10:00 AM Medical Record Number: [id number] Patient Account Number: [id number] Date of Birth/Sex: [birth year] (75 y.o. F) Treating RN: Dolan Amen Primary Care Provider: SYSTEM, PCP Other Clinician: Referring Provider: RAO, Astrid Divine Treating Provider/Extender: Yaakov Guthrie in Treatment: 0 Information Obtained From Patient Constitutional Symptoms (General Health) Complaints and Symptoms: Negative for: Fatigue; Fever; Chills; Marked Weight Change Eyes Complaints and Symptoms: Negative for: Dry Eyes; Vision Changes; Glasses / Contacts Medical History: Negative for: Cataracts; Glaucoma; Optic Neuritis Ear/Nose/Mouth/Throat Complaints and Symptoms: Negative for: Difficult clearing ears; Sinusitis Medical History: Negative for: Chronic sinus problems/congestion; Middle ear problems Hematologic/Lymphatic Complaints and Symptoms: Negative for: Bleeding / Clotting Disorders; Human Immunodeficiency Virus Medical History: Negative for: Anemia; Hemophilia; Human Immunodeficiency Virus; Lymphedema; Sickle Cell Disease Respiratory Complaints and Symptoms: Negative for: Chronic or frequent coughs; Shortness of Breath Medical History: Negative for: Aspiration; Asthma; Chronic Obstructive Pulmonary  Disease (COPD); Pneumothorax; Sleep Apnea; Tuberculosis Cardiovascular Complaints and Symptoms: Positive for: LE edema Negative for: Chest pain Medical History: Positive for: Hypertension Negative for: Angina; Arrhythmia; Congestive Heart Failure; Coronary Artery Disease; Deep Vein Thrombosis; Hypotension; Myocardial Infarction; Peripheral Arterial Disease; Peripheral Venous Disease; Phlebitis; Vasculitis Gastrointestinal Complaints and Symptoms: Negative for: Frequent  diarrhea; Nausea; Vomiting Medical History: Negative for: Cirrhosis ; Colitis; Crohnos; Hepatitis A; Hepatitis B; Hepatitis C Endocrine JOSCELYNN, BRUTUS. ([id number]) Complaints and Symptoms: Negative for: Hepatitis; Thyroid disease; Polydypsia (Excessive Thirst) Medical History: Negative for: Type I Diabetes; Type II Diabetes Genitourinary Complaints and Symptoms: Negative for: Kidney failure/ Dialysis; Incontinence/dribbling Medical History: Negative for: End Stage Renal Disease Immunological Complaints and Symptoms: Negative for: Hives; Itching Medical History: Negative for: Lupus Erythematosus; Raynaudos; Scleroderma Integumentary (Skin) Complaints and Symptoms: Positive for: Wounds Negative for: Bleeding or bruising tendency; Breakdown; Swelling Medical History: Negative for: History of Burn; History of pressure wounds Musculoskeletal Complaints and Symptoms: Negative for: Muscle Pain; Muscle Weakness Medical History: Positive for: Osteoarthritis Negative for: Gout; Rheumatoid Arthritis; Osteomyelitis Psychiatric Complaints and Symptoms: Positive for: Anxiety Oncologic Medical History: Positive for: Received Chemotherapy; Received Radiation Past Medical History Notes: breast cancer Immunizations Pneumococcal Vaccine: Received Pneumococcal Vaccination: No Implantable Devices None Family and Social History Never smoker Electronic Signature(s) Signed: 07/06/2021 4:59:39 PM By: Dolan Amen  RN Signed: 07/13/2021 4:42:29 PM By: Kalman Shan DO Entered By: Dolan Amen on 07/06/2021 10:33:25 ANYLAH, SCHEIB ([id number]) GRACELIN, WEISBERG. ([id number]) -------------------------------------------------------------------------------- SuperBill Details Patient Name: ELLENIE, SALOME. Date of Service: 07/06/2021 Medical Record Number: [id number] Patient Account Number: [id number] Date of Birth/Sex: November 06, 1946 (75 y.o. F) Treating RN: Dolan Amen Primary Care Provider: SYSTEM, PCP Other Clinician: Referring Provider: RAO, Astrid Divine Treating Provider/Extender: Yaakov Guthrie in Treatment: 0 Diagnosis Coding ICD-10 Codes Code Description S81.802A Unspecified open wound, left lower leg, initial encounter I10 Essential (primary) hypertension 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 Facility Procedures CPT4 Code: 93810175 Description: [place] VISIT-LEV 3 EST PT Modifier: Quantity: 1 CPT4 Code: 10258527 Description: 78242 - DEB SUBQ TISSUE 20 SQ CM/< Modifier: Quantity: 1 CPT4 Code: Description: ICD-10 Diagnosis Description S81.802A Unspecified open wound, left lower leg, initial encounter Modifier: Quantity: CPT4 Code: 35361443 Description: 15400 - DEB SUBQ TISS EA ADDL 20CM Modifier: Quantity: 5 CPT4 Code: Description: ICD-10 Diagnosis Description S81.802A Unspecified open wound, left lower leg, initial encounter Modifier: Quantity: Physician Procedures CPT4 Code: 8676195 Description: 09326 - WC PHYS LEVEL 4 - NEW PT Modifier: Quantity: 1 CPT4 Code: Description: ICD-10 Diagnosis Description S81.802A Unspecified open wound, left lower leg, initial encounter I10 Essential (primary) hypertension 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 Modifier: Quantity: CPT4 Code: 7124580 Description: 99833 - WC PHYS SUBQ TISS 20 SQ CM Modifier: Quantity: 1 CPT4 Code: Description: ICD-10 Diagnosis Description S81.802A Unspecified open wound,  left lower leg, initial encounter Modifier: Quantity: CPT4 Code: 8250539 Description: 76734 - WC PHYS SUBQ TISS EA ADDL 20 CM Modifier: Quantity: 5 CPT4 Code: Description: ICD-10 Diagnosis Description S81.802A Unspecified open wound, left lower leg, initial encounter Modifier: Quantity: Electronic Signature(s) Signed: 07/13/2021 4:42:29 PM By: Kalman Shan DO Previous Signature: 07/06/2021 4:59:39 PM Version By: Dolan Amen RN Entered By: Kalman Shan on 07/06/2021 17:07:13

## 2021-07-13 NOTE — Progress Notes (Signed)
Laura, Santiago ([id number]) Visit Report for 07/06/2021 Allergy List Details Patient Name: Laura Santiago, Laura Santiago. Date of Service: 07/06/2021 10:00 AM Medical Record Number: [id number] Patient Account Number: [id number] Date of Birth/Sex: 02/25/1946 (75 y.o. F) Treating RN: Dolan Amen Primary Care Jerianne Anselmo: SYSTEM, PCP Other Clinician: Referring Jeydan Barner: RAO, Astrid Divine Treating Josselin Gaulin/Extender: Yaakov Guthrie in Treatment: 0 Allergies Active Allergies Celebrex Allergy Notes Electronic Signature(s) Signed: 07/06/2021 4:59:39 PM By: Dolan Amen RN Entered By: Dolan Amen on 07/06/2021 10:28:46 GLENICE, CICCONE ([id number]) -------------------------------------------------------------------------------- Arrival Information Details Patient Name: Laura Santiago. Date of Service: 07/06/2021 10:00 AM Medical Record Number: [id number] Patient Account Number: [id number] Date of Birth/Sex: 1946/03/07 (75 y.o. F) Treating RN: Dolan Amen Primary Care Beth Spackman: SYSTEM, PCP Other Clinician: Referring Evonna Stoltz: RAO, Astrid Divine Treating Pamila Mendibles/Extender: Yaakov Guthrie in Treatment: 0 Visit Information Patient Arrived: Cane Arrival Time: 10:19 Accompanied By: self Transfer Assistance: None Patient Identification Verified: Yes Secondary Verification Process Completed: Yes Electronic Signature(s) Signed: 07/06/2021 4:59:39 PM By: Dolan Amen RN Entered By: Dolan Amen on 07/06/2021 10:22:37 Laura Santiago ([id number]) -------------------------------------------------------------------------------- Clinic Level of Care Assessment Details Patient Name: Laura Santiago. Date of Service: 07/06/2021 10:00 AM Medical Record Number: [id number] Patient Account Number: [id number] Date of Birth/Sex: [birth year] (75 y.o. F) Treating RN: Dolan Amen Primary Care Lexus Shampine: SYSTEM, PCP Other Clinician: Referring Shala Baumbach: RAO, Astrid Divine Treating  Floriene Jeschke/Extender: Yaakov Guthrie in Treatment: 0 Clinic Level of Care Assessment Items TOOL 1 Quantity Score X - Use when EandM and Procedure is performed on INITIAL visit 1 0 ASSESSMENTS - Nursing Assessment / Reassessment X - General Physical Exam (combine w/ comprehensive assessment (listed just below) when performed on new 1 20 pt. evals) X- 1 25 Comprehensive Assessment (HX, ROS, Risk Assessments, Wounds Hx, etc.) ASSESSMENTS - Wound and Skin Assessment / Reassessment X - Dermatologic / Skin Assessment (not related to wound area) 1 10 ASSESSMENTS - Ostomy and/or Continence Assessment and Care []  - Incontinence Assessment and Management 0 []  - 0 Ostomy Care Assessment and Management (repouching, etc.) PROCESS - Coordination of Care X - Simple Patient / Family Education for ongoing care 1 15 []  - 0 Complex (extensive) Patient / Family Education for ongoing care []  - 0 Staff obtains Programmer, systems, Records, Test Results / Process Orders []  - 0 Staff telephones HHA, Nursing Homes / Clarify orders / etc []  - 0 Routine Transfer to another Facility (non-emergent condition) []  - 0 Routine Hospital Admission (non-emergent condition) []  - 0 New Admissions / Biomedical engineer / Ordering NPWT, Apligraf, etc. []  - 0 Emergency Hospital Admission (emergent condition) PROCESS - Special Needs []  - Pediatric / Minor Patient Management 0 []  - 0 Isolation Patient Management []  - 0 Hearing / Language / Visual special needs []  - 0 Assessment of Community assistance (transportation, D/C planning, etc.) []  - 0 Additional assistance / Altered mentation []  - 0 Support Surface(s) Assessment (bed, cushion, seat, etc.) INTERVENTIONS - Miscellaneous []  - External ear exam 0 []  - 0 Patient Transfer (multiple staff / Civil Service fast streamer / Similar devices) []  - 0 Simple Staple / Suture removal (25 or less) []  - 0 Complex Staple / Suture removal (26 or more) []  - 0 Hypo/Hyperglycemic  Management (do not check if billed separately) X- 1 15 Ankle / Brachial Index (ABI) - do not check if billed separately Has the patient been seen at the hospital within the last three years: Yes Total Score: 85 Level Of Care: New/Established - Level 3 Goin, [place]  [name] ([id number]) Electronic Signature(s) Signed: 07/06/2021 4:59:39 PM By: Dolan Amen RN Entered By: Dolan Amen on 07/06/2021 11:21:39 ANNISTYN, DEPASS ([id number]) -------------------------------------------------------------------------------- Encounter Discharge Information Details Patient Name: Laura, Santiago. Date of Service: 07/06/2021 10:00 AM Medical Record Number: [id number] Patient Account Number: [id number] Date of Birth/Sex: 03-08-1946 (75 y.o. F) Treating RN: Dolan Amen Primary Care Baylie Drakes: SYSTEM, PCP Other Clinician: Referring Sanjith Siwek: RAO, Astrid Divine Treating [name]/Extender: Yaakov Guthrie in Treatment: 0 Encounter Discharge Information Items Post Procedure Vitals Discharge Condition: Stable Temperature (F): 98.5 Ambulatory Status: Cane Pulse (bpm): 84 Discharge Destination: Home Respiratory Rate (breaths/min): 18 Transportation: Other Blood Pressure (mmHg): 126/78 Accompanied By: self Schedule Follow-up Appointment: Yes Clinical Summary of Care: Electronic Signature(s) Signed: 07/06/2021 4:59:39 PM By: Dolan Amen RN Entered By: Dolan Amen on 07/06/2021 13:33:16 Laura Santiago ([id number]) -------------------------------------------------------------------------------- Lower Extremity Assessment Details Patient Name: Laura Santiago. Date of Service: 07/06/2021 10:00 AM Medical Record Number: [id number] Patient Account Number: [id number] Date of Birth/Sex: [birth year] (75 y.o. F) Treating RN: Dolan Amen Primary Care [name]: SYSTEM, PCP Other Clinician: Referring Kaiden Pech: RAO, Astrid Divine Treating Bhakti Labella/Extender: Yaakov Guthrie in Treatment:  0 Edema Assessment Assessed: [Left: Yes] [Right: No] Edema: [Left: Ye] [Right: s] Calf Left: Right: Point of Measurement: 29 cm From Medial Instep 37.5 cm Ankle Left: Right: Point of Measurement: 10 cm From Medial Instep 21.5 cm Knee To Floor Left: Right: From Medial Instep 43 cm Vascular Assessment Pulses: Dorsalis Pedis Palpable: [Left:Yes] Doppler Audible: [Left:Yes] Posterior Tibial Palpable: [Left:Yes] Doppler Audible: [Left:Yes] Blood Pressure: Brachial: [Left:118] Dorsalis Pedis: 162 Ankle: Posterior Tibial: 176 Ankle Brachial Index: [Left:1.49] Electronic Signature(s) Signed: 07/06/2021 4:59:39 PM By: Dolan Amen RN Entered By: Dolan Amen on 07/06/2021 10:55:26 Laura Santiago ([id number]) -------------------------------------------------------------------------------- Multi Wound Chart Details Patient Name: Laura Santiago. Date of Service: 07/06/2021 10:00 AM Medical Record Number: [id number] Patient Account Number: [id number] Date of Birth/Sex: 1946/08/26 (75 y.o. F) Treating RN: Dolan Amen Primary Care Laquan Beier: SYSTEM, PCP Other Clinician: Referring Kiyani Jernigan: RAO, Astrid Divine Treating Efrat Zuidema/Extender: Yaakov Guthrie in Treatment: 0 Vital Signs Height(in): 49 Pulse(bpm): 38 Weight(lbs): 153 Blood Pressure(mmHg): 126/78 Body Mass Index(BMI): 25 Temperature(F): 98.5 Respiratory Rate(breaths/min): 18 Photos: [N/A:N/A] Wound Location: Left, Medial Lower Leg N/A N/A Wounding Event: Gradually Appeared N/A N/A Primary Etiology: Venous Leg Ulcer N/A N/A Comorbid History: Hypertension, Osteoarthritis, N/A N/A Received Chemotherapy, Received Radiation Date Acquired: 04/06/2021 N/A N/A Weeks of Treatment: 0 N/A N/A Wound Status: Open N/A N/A Clustered Wound: Yes N/A N/A Clustered Quantity: 4 N/A N/A Measurements L x W x D (cm) 11.5x10x0.2 N/A N/A Area (cm) : 90.321 N/A N/A Volume (cm) : 18.064 N/A N/A Classification: Full  Thickness Without Exposed N/A N/A Support Structures Exudate Amount: Large N/A N/A Exudate Type: Serosanguineous N/A N/A Exudate Color: red, brown N/A N/A Granulation Amount: Small (1-33%) N/A N/A Granulation Quality: Red N/A N/A Necrotic Amount: Large (67-100%) N/A N/A Exposed Structures: Fat Layer (Subcutaneous Tissue): N/A N/A Yes Fascia: No Tendon: No Muscle: No Joint: No Bone: No Epithelialization: None N/A N/A Debridement: Debridement - Excisional N/A N/A Pre-procedure Verification/Time 11:13 N/A N/A Out Taken: Tissue Debrided: Subcutaneous, Slough N/A N/A Level: Skin/Subcutaneous Tissue N/A N/A Debridement Area (sq cm): 115 N/A N/A Instrument: Curette N/A N/A Bleeding: Minimum N/A N/A Hemostasis Achieved: Pressure N/A N/A Debridement Treatment Procedure was tolerated well N/A N/A Response: Post Debridement 11.5x10x0.2 N/A N/A Measurements L x W x D (cm) KALLEY, NICHOLL M. ([id number]) Post Debridement Volume: 18.064 N/A N/A (cm)  Procedures Performed: Debridement N/A N/A Treatment Notes Wound #1 (Lower Leg) Wound Laterality: Left, Medial Cleanser Soap and Water Discharge Instruction: Gently cleanse wound with antibacterial soap, rinse and pat dry prior to dressing wounds Peri-Wound Care Topical Gentamicin Discharge Instruction: Apply as directed by Rudene Poulsen. Santyl Collagenase Ointment, 30 (gm), tube Discharge Instruction: Apply nickel thick to wound bed only Primary Dressing Secondary Dressing ABD Pad 5x9 (in/in) Discharge Instruction: Cover with ABD pad Hydrofera Blue Ready Transfer Foam, 4x5 (in/in) Discharge Instruction: Apply to wound bed over non-stick dressing. Secured With Coban Cohesive Bandage 4x5 (yds) Stretched Discharge Instruction: Apply Coban as directed. Kerlix Roll Sterile or Non-Sterile 6-ply 4.5x4 (yd/yd) Discharge Instruction: Apply Kerlix as directed Compression Wrap Compression Stockings Add-Ons Electronic Signature(s) Signed:  07/13/2021 4:42:29 PM By: Kalman Shan DO Entered By: Kalman Shan on 07/06/2021 13:47:26 Laura Santiago ([id number]) -------------------------------------------------------------------------------- Hood Details Patient Name: YORLEY, BUCH. Date of Service: 07/06/2021 10:00 AM Medical Record Number: [id number] Patient Account Number: [id number] Date of Birth/Sex: [birth year] (75 y.o. F) Treating RN: Dolan Amen Primary Care Kaleea Penner: SYSTEM, PCP Other Clinician: Referring Nikitta Sobiech: RAO, Astrid Divine Treating Bashar Milam/Extender: Yaakov Guthrie in Treatment: 0 Active Inactive Necrotic Tissue Nursing Diagnoses: Impaired tissue integrity related to necrotic/devitalized tissue Knowledge deficit related to management of necrotic/devitalized tissue Goals: Necrotic/devitalized tissue will be minimized in the wound bed Date Initiated: 07/06/2021 Target Resolution Date: 07/06/2021 Goal Status: Active Patient/caregiver will verbalize understanding of reason and process for debridement of necrotic tissue Date Initiated: 07/06/2021 Target Resolution Date: 07/06/2021 Goal Status: Active Interventions: Assess patient pain level pre-, during and post procedure and prior to discharge Provide education on necrotic tissue and debridement process Treatment Activities: Apply topical anesthetic as ordered : 07/06/2021 Biologic debridement : 07/06/2021 Enzymatic debridement : 07/06/2021 Excisional debridement : 07/06/2021 Notes: Orientation to the Wound Care Program Nursing Diagnoses: Knowledge deficit related to the wound healing center program Goals: Patient/caregiver will verbalize understanding of the [place] Date Initiated: 07/06/2021 Target Resolution Date: 07/06/2021 Goal Status: Active Interventions: Provide education on orientation to the wound center Notes: Wound/Skin Impairment Nursing Diagnoses: Impaired tissue  integrity Goals: Patient/caregiver will verbalize understanding of skin care regimen Date Initiated: 07/06/2021 Target Resolution Date: 07/06/2021 Goal Status: Active Ulcer/skin breakdown will have a volume reduction of 30% by week 4 Date Initiated: 07/06/2021 Target Resolution Date: 08/06/2021 Goal Status: Active Ulcer/skin breakdown will have a volume reduction of 50% by week 8 SHERILL, MANGEN ([id number]) Date Initiated: 07/06/2021 Target Resolution Date: 09/06/2021 Goal Status: Active Ulcer/skin breakdown will have a volume reduction of 80% by week 12 Date Initiated: 07/06/2021 Target Resolution Date: 10/06/2021 Goal Status: Active Ulcer/skin breakdown will heal within 14 weeks Date Initiated: 07/06/2021 Target Resolution Date: 11/06/2021 Goal Status: Active Interventions: Assess patient/caregiver ability to obtain necessary supplies Assess patient/caregiver ability to perform ulcer/skin care regimen upon admission and as needed Assess ulceration(s) every visit Treatment Activities: Referred to DME Celvin Taney for dressing supplies : 07/06/2021 Skin care regimen initiated : 07/06/2021 Notes: Electronic Signature(s) Signed: 07/06/2021 4:59:39 PM By: Dolan Amen RN Entered By: Dolan Amen on 07/06/2021 13:32:31 Laura Santiago ([id number]) -------------------------------------------------------------------------------- Pain Assessment Details Patient Name: Laura Santiago. Date of Service: 07/06/2021 10:00 AM Medical Record Number: [id number] Patient Account Number: [id number] Date of Birth/Sex: 18-May-1946 (75 y.o. F) Treating RN: Dolan Amen Primary Care Halia Franey: SYSTEM, PCP Other Clinician: Referring Azzan Butler: RAO, Astrid Divine Treating Couper Juncaj/Extender: Yaakov Guthrie in Treatment: 0 Active Problems Location of Pain Severity and Description of Pain Patient  Has Paino No Site Locations Rate the pain. Current Pain Level: 0 Pain Management and  Medication Current Pain Management: Electronic Signature(s) Signed: 07/06/2021 4:59:39 PM By: Dolan Amen RN Entered By: Dolan Amen on 07/06/2021 10:22:46 JIMENA, WIECZOREK ([id number]) -------------------------------------------------------------------------------- Patient/Caregiver Education Details Patient Name: Laura Santiago. Date of Service: 07/06/2021 10:00 AM Medical Record Number: [id number] Patient Account Number: [id number] Date of Birth/Gender: Sep 01, 1946 (75 y.o. F) Treating RN: Dolan Amen Primary Care Physician: SYSTEM, PCP Other Clinician: Referring Physician: RAO, Astrid Divine Treating Physician/Extender: Yaakov Guthrie in Treatment: 0 Education Assessment Education Provided To: Patient Education Topics Provided Welcome To [facility]: Methods: Explain/Verbal Responses: State content correctly Wound Debridement: Methods: Explain/Verbal Responses: State content correctly Wound/Skin Impairment: Methods: Explain/Verbal Responses: State content correctly Electronic Signature(s) Signed: 07/06/2021 4:59:39 PM By: Dolan Amen RN Entered By: Dolan Amen on 07/06/2021 11:22:01 Laura Santiago ([id number]) -------------------------------------------------------------------------------- Wound Assessment Details Patient Name: Laura Santiago. Date of Service: 07/06/2021 10:00 AM Medical Record Number: [id number] Patient Account Number: [id number] Date of Birth/Sex: February 03, 1946 (75 y.o. F) Treating RN: Dolan Amen Primary Care Madelin Weseman: SYSTEM, PCP Other Clinician: Referring Natiya Seelinger: RAO, Astrid Divine Treating Allura Doepke/Extender: Yaakov Guthrie in Treatment: 0 Wound Status Wound Number: 1 Primary Venous Leg Ulcer Etiology: Wound Location: Left, Medial Lower Leg Wound Status: Open Wounding Event: Gradually Appeared Comorbid Hypertension, Osteoarthritis, Received Chemotherapy, Date Acquired: 04/06/2021 History: Received  Radiation Weeks Of Treatment: 0 Clustered Wound: Yes Photos Wound Measurements Length: (cm) 11.5 Width: (cm) 10 Depth: (cm) 0.2 Clustered Quantity: 4 Area: (cm) 90.321 Volume: (cm) 18.064 % Reduction in Area: % Reduction in Volume: Epithelialization: None Tunneling: No Undermining: No Wound Description Classification: Full Thickness Without Exposed Support Structu Exudate Amount: Large Exudate Type: Serosanguineous Exudate Color: red, brown res Foul Odor After Cleansing: No Slough/Fibrino Yes Wound Bed Granulation Amount: Small (1-33%) Exposed Structure Granulation Quality: Red Fascia Exposed: No Necrotic Amount: Large (67-100%) Fat Layer (Subcutaneous Tissue) Exposed: Yes Necrotic Quality: Adherent Slough Tendon Exposed: No Muscle Exposed: No Joint Exposed: No Bone Exposed: No Electronic Signature(s) Signed: 07/06/2021 4:59:39 PM By: Dolan Amen RN Entered By: Dolan Amen on 07/06/2021 10:43:47 Laura Santiago ([id number]) -------------------------------------------------------------------------------- Vitals Details Patient Name: Laura Santiago. Date of Service: 07/06/2021 10:00 AM Medical Record Number: [id number] Patient Account Number: [id number] Date of Birth/Sex: [birth year] (75 y.o. F) Treating RN: Dolan Amen Primary Care Ivyana Locey: SYSTEM, PCP Other Clinician: Referring Vela Render: RAO, Astrid Divine Treating Tashan Kreitzer/Extender: Yaakov Guthrie in Treatment: 0 Vital Signs Time Taken: 10:22 Temperature (F): 98.5 Height (in): 66 Pulse (bpm): 84 Source: Stated Respiratory Rate (breaths/min): 18 Weight (lbs): 153 Blood Pressure (mmHg): 126/78 Source: Measured Reference Range: 80 - 120 mg / dl Body Mass Index (BMI): 24.7 Electronic Signature(s) Signed: 07/06/2021 4:59:39 PM By: Dolan Amen RN Entered By: Dolan Amen on 07/06/2021 10:24:03

## 2021-07-13 NOTE — Progress Notes (Addendum)
AMILLIA, BIFFLE ([id number]) Visit Report for 07/13/2021 Arrival Information Details Patient Name: Laura Santiago, Laura Santiago. Date of Service: 07/13/2021 2:45 PM Medical Record Number: [id number] Patient Account Number: [id number] Date of Birth/Sex: 1946-03-30 (75 y.o. F) Treating RN: Dolan Amen Primary Care Jadiel Schmieder: SYSTEM, PCP Other Clinician: Referring Urban Naval: RAO, Astrid Divine Treating Kittie Krizan/Extender: Yaakov Guthrie in Treatment: 1 Visit Information History Since Last Visit Pain Present Now: No Patient Arrived: Cane Arrival Time: 15:23 Accompanied By: self/emotional support Equities trader Assistance: None Patient Identification Verified: Yes Secondary Verification Process Yes Completed: Patient Has Alerts: Yes Patient Alerts: PT HAS SERVICE ANIMAL Electronic Signature(s) Signed: 07/20/2021 12:32:26 PM By: Dolan Amen RN Previous Signature: 07/13/2021 4:37:23 PM Version By: Dolan Amen RN Entered By: Dolan Amen on 07/20/2021 12:32:25 Laura Santiago ([id number]) -------------------------------------------------------------------------------- Clinic Level of Care Assessment Details Patient Name: Laura Santiago. Date of Service: 07/13/2021 2:45 PM Medical Record Number: [id number] Patient Account Number: [id number] Date of Birth/Sex: 1946/08/30 (75 y.o. F) Treating RN: Dolan Amen Primary Care Shyne Resch: SYSTEM, PCP Other Clinician: Referring Neeraj Housand: RAO, Astrid Divine Treating Havanah Nelms/Extender: Yaakov Guthrie in Treatment: 1 Clinic Level of Care Assessment Items TOOL 1 Quantity Score []  - Use when EandM and Procedure is performed on INITIAL visit 0 ASSESSMENTS - Nursing Assessment / Reassessment []  - General Physical Exam (combine w/ comprehensive assessment (listed just below) when performed on new 0 pt. evals) []  - 0 Comprehensive Assessment (HX, ROS, Risk Assessments, Wounds Hx, etc.) ASSESSMENTS - Wound and Skin Assessment / Reassessment []   - Dermatologic / Skin Assessment (not related to wound area) 0 ASSESSMENTS - Ostomy and/or Continence Assessment and Care []  - Incontinence Assessment and Management 0 []  - 0 Ostomy Care Assessment and Management (repouching, etc.) PROCESS - Coordination of Care []  - Simple Patient / Family Education for ongoing care 0 []  - 0 Complex (extensive) Patient / Family Education for ongoing care []  - 0 Staff obtains Programmer, systems, Records, Test Results / Process Orders []  - 0 Staff telephones HHA, Nursing Homes / Clarify orders / etc []  - 0 Routine Transfer to another Facility (non-emergent condition) []  - 0 Routine Hospital Admission (non-emergent condition) []  - 0 New Admissions / Biomedical engineer / Ordering NPWT, Apligraf, etc. []  - 0 Emergency Hospital Admission (emergent condition) PROCESS - Special Needs []  - Pediatric / Minor Patient Management 0 []  - 0 Isolation Patient Management []  - 0 Hearing / Language / Visual special needs []  - 0 Assessment of Community assistance (transportation, D/C planning, etc.) []  - 0 Additional assistance / Altered mentation []  - 0 Support Surface(s) Assessment (bed, cushion, seat, etc.) INTERVENTIONS - Miscellaneous []  - External ear exam 0 []  - 0 Patient Transfer (multiple staff / Civil Service fast streamer / Similar devices) []  - 0 Simple Staple / Suture removal (25 or less) []  - 0 Complex Staple / Suture removal (26 or more) []  - 0 Hypo/Hyperglycemic Management (do not check if billed separately) []  - 0 Ankle / Brachial Index (ABI) - do not check if billed separately Has the patient been seen at the hospital within the last three years: Yes Total Score: 0 Level Of Care: ____ Laura Santiago ([id number]) Electronic Signature(s) Signed: 07/13/2021 4:40:35 PM By: Dolan Amen RN Entered By: Dolan Amen on 07/13/2021 16:02:06 Laura Santiago  ([id number]) -------------------------------------------------------------------------------- Compression Therapy Details Patient Name: Laura Santiago. Date of Service: 07/13/2021 2:45 PM Medical Record Number: [id number] Patient Account Number: [id number] Date of Birth/Sex: [birth year] (75 y.o. F) Treating RN: Laurin Coder,  Minus Breeding Primary Care Nyzir Dubois: SYSTEM, PCP Other Clinician: Referring Naelani Lafrance: RAO, Astrid Divine Treating Ander Wamser/Extender: Yaakov Guthrie in Treatment: 1 Compression Therapy Performed for Wound Assessment: Wound #1 Left,Medial Lower Leg Performed By: Clinician Dolan Amen, RN Compression Type: Three Layer Pre Treatment ABI: 1.5 Post Procedure Diagnosis Same as Pre-procedure Electronic Signature(s) Signed: 07/13/2021 4:40:35 PM By: Dolan Amen RN Entered By: Dolan Amen on 07/13/2021 16:00:39 Laura Santiago, Laura Santiago ([id number]) -------------------------------------------------------------------------------- Encounter Discharge Information Details Patient Name: Laura Santiago, Laura Santiago. Date of Service: 07/13/2021 2:45 PM Medical Record Number: [id number] Patient Account Number: [id number] Date of Birth/Sex: 1946-03-27 (75 y.o. F) Treating RN: Dolan Amen Primary Care Ervan Heber: SYSTEM, PCP Other Clinician: Referring Lamari Youngers: RAO, Astrid Divine Treating Aydan Levitz/Extender: Yaakov Guthrie in Treatment: 1 Encounter Discharge Information Items Post Procedure Vitals Discharge Condition: Stable Temperature (F): 98.4 Ambulatory Status: Cane Pulse (bpm): 87 Discharge Destination: Home Respiratory Rate (breaths/min): 18 Transportation: Private Auto Blood Pressure (mmHg): 160/73 Accompanied By: self/emotional support animal Schedule Follow-up Appointment: Yes Clinical Summary of Care: Electronic Signature(s) Signed: 07/13/2021 4:37:13 PM By: Dolan Amen RN Entered By: Dolan Amen on 07/13/2021 16:37:13 Laura Santiago  ([id number]) -------------------------------------------------------------------------------- Lower Extremity Assessment Details Patient Name: Laura Santiago. Date of Service: 07/13/2021 2:45 PM Medical Record Number: [id number] Patient Account Number: [id number] Date of Birth/Sex: 18-Apr-1946 (75 y.o. F) Treating RN: Dolan Amen Primary Care Brook Mall: SYSTEM, PCP Other Clinician: Referring Lorris Carducci: RAO, Astrid Divine Treating Naftula Donahue/Extender: Yaakov Guthrie in Treatment: 1 Edema Assessment Assessed: [Left: Yes] [Right: No] Edema: [Left: Ye] [Right: s] Calf Left: Right: Point of Measurement: 29 cm From Medial Instep 34.5 cm Ankle Left: Right: Point of Measurement: 10 cm From Medial Instep 23 cm Vascular Assessment Pulses: Dorsalis Pedis Palpable: [Left:Yes] Electronic Signature(s) Signed: 07/13/2021 4:40:35 PM By: Dolan Amen RN Entered By: Dolan Amen on 07/13/2021 15:33:31 Laura Santiago ([id number]) -------------------------------------------------------------------------------- Multi Wound Chart Details Patient Name: Laura Santiago. Date of Service: 07/13/2021 2:45 PM Medical Record Number: [id number] Patient Account Number: [id number] Date of Birth/Sex: 1946/08/23 (75 y.o. F) Treating RN: Dolan Amen Primary Care Exa Bomba: SYSTEM, PCP Other Clinician: Referring Janeane Cozart: RAO, Astrid Divine Treating Tyheim Vanalstyne/Extender: Yaakov Guthrie in Treatment: 1 Vital Signs Height(in): 73 Pulse(bpm): 52 Weight(lbs): 153 Blood Pressure(mmHg): 160/73 Body Mass Index(BMI): 25 Temperature(F): 98.4 Respiratory Rate(breaths/min): 18 Photos: [N/A:N/A] Wound Location: Left, Medial Lower Leg N/A N/A Wounding Event: Gradually Appeared N/A N/A Primary Etiology: Venous Leg Ulcer N/A N/A Comorbid History: Hypertension, Osteoarthritis, N/A N/A Received Chemotherapy, Received Radiation Date Acquired: 04/06/2021 N/A N/A Weeks of Treatment: 1 N/A N/A Wound Status:  Open N/A N/A Clustered Wound: Yes N/A N/A Clustered Quantity: 4 N/A N/A Measurements L x W x D (cm) 8.4x9.5x0.2 N/A N/A Area (cm) : 62.675 N/A N/A Volume (cm) : 12.535 N/A N/A % Reduction in Area: 30.60% N/A N/A % Reduction in Volume: 30.60% N/A N/A Classification: Full Thickness Without Exposed N/A N/A Support Structures Exudate Amount: Large N/A N/A Exudate Type: Serosanguineous N/A N/A Exudate Color: red, brown N/A N/A Granulation Amount: Small (1-33%) N/A N/A Granulation Quality: Red N/A N/A Necrotic Amount: Large (67-100%) N/A N/A Exposed Structures: Fat Layer (Subcutaneous Tissue): N/A N/A Yes Fascia: No Tendon: No Muscle: No Joint: No Bone: No Epithelialization: Large (67-100%) N/A N/A Debridement: Debridement - Excisional N/A N/A Pre-procedure Verification/Time 15:58 N/A N/A Out Taken: Tissue Debrided: Subcutaneous, Slough N/A N/A Level: Skin/Subcutaneous Tissue N/A N/A Debridement Area (sq cm): 79.8 N/A N/A Instrument: Curette N/A N/A Bleeding: Minimum N/A N/A Hemostasis Achieved: Pressure N/A N/A  Debridement Treatment Procedure was tolerated well N/A N/A Response: Laura Santiago, Laura Santiago ([id number]) Post Debridement 8.4x9.5x0.3 N/A N/A Measurements L x W x D (cm) Post Debridement Volume: 18.802 N/A N/A (cm) Procedures Performed: Compression Therapy N/A N/A Debridement Treatment Notes Electronic Signature(s) Signed: 07/13/2021 4:27:41 PM By: Kalman Shan DO Entered By: Kalman Shan on 07/13/2021 16:14:22 Laura Santiago ([id number]) -------------------------------------------------------------------------------- Multi-Disciplinary Care Plan Details Patient Name: Laura Santiago. Date of Service: 07/13/2021 2:45 PM Medical Record Number: [id number] Patient Account Number: [id number] Date of Birth/Sex: 03-10-1946 (75 y.o. F) Treating RN: Dolan Amen Primary Care Concepcion Gillott: SYSTEM, PCP Other Clinician: Referring Zaidyn Claire: RAO, Astrid Divine Treating  Timmy Cleverly/Extender: Yaakov Guthrie in Treatment: 1 Active Inactive Necrotic Tissue Nursing Diagnoses: Impaired tissue integrity related to necrotic/devitalized tissue Knowledge deficit related to management of necrotic/devitalized tissue Goals: Necrotic/devitalized tissue will be minimized in the wound bed Date Initiated: 07/06/2021 Target Resolution Date: 07/06/2021 Goal Status: Active Patient/caregiver will verbalize understanding of reason and process for debridement of necrotic tissue Date Initiated: 07/06/2021 Target Resolution Date: 07/06/2021 Goal Status: Active Interventions: Assess patient pain level pre-, during and post procedure and prior to discharge Provide education on necrotic tissue and debridement process Treatment Activities: Apply topical anesthetic as ordered : 07/06/2021 Biologic debridement : 07/06/2021 Enzymatic debridement : 07/06/2021 Excisional debridement : 07/06/2021 Notes: Wound/Skin Impairment Nursing Diagnoses: Impaired tissue integrity Goals: Patient/caregiver will verbalize understanding of skin care regimen Date Initiated: 07/06/2021 Target Resolution Date: 07/06/2021 Goal Status: Active Ulcer/skin breakdown will have a volume reduction of 30% by week 4 Date Initiated: 07/06/2021 Target Resolution Date: 08/06/2021 Goal Status: Active Ulcer/skin breakdown will have a volume reduction of 50% by week 8 Date Initiated: 07/06/2021 Target Resolution Date: 09/06/2021 Goal Status: Active Ulcer/skin breakdown will have a volume reduction of 80% by week 12 Date Initiated: 07/06/2021 Target Resolution Date: 10/06/2021 Goal Status: Active Ulcer/skin breakdown will heal within 14 weeks Date Initiated: 07/06/2021 Target Resolution Date: 11/06/2021 Goal Status: Active Interventions: Assess patient/caregiver ability to obtain necessary supplies Assess patient/caregiver ability to perform ulcer/skin care regimen upon admission and as needed Assess  ulceration(s) every visit Laura Santiago, Laura Santiago ([id number]) Treatment Activities: Referred to DME Nikiyah Fackler for dressing supplies : 07/06/2021 Skin care regimen initiated : 07/06/2021 Notes: Electronic Signature(s) Signed: 07/13/2021 4:40:35 PM By: Dolan Amen RN Entered By: Dolan Amen on 07/13/2021 15:33:44 Laura Santiago ([id number]) -------------------------------------------------------------------------------- Pain Assessment Details Patient Name: Laura Santiago. Date of Service: 07/13/2021 2:45 PM Medical Record Number: [id number] Patient Account Number: [id number] Date of Birth/Sex: 1946/07/09 (75 y.o. F) Treating RN: Dolan Amen Primary Care Khali Albanese: SYSTEM, PCP Other Clinician: Referring Sriansh Farra: RAO, Astrid Divine Treating Denina Rieger/Extender: Yaakov Guthrie in Treatment: 1 Active Problems Location of Pain Severity and Description of Pain Patient Has Paino No Site Locations Rate the pain. Current Pain Level: 0 Pain Management and Medication Current Pain Management: Electronic Signature(s) Signed: 07/13/2021 4:40:35 PM By: Dolan Amen RN Entered By: Dolan Amen on 07/13/2021 15:25:05 Laura Santiago ([id number]) -------------------------------------------------------------------------------- Patient/Caregiver Education Details Patient Name: Laura Santiago. Date of Service: 07/13/2021 2:45 PM Medical Record Number: [id number] Patient Account Number: [id number] Date of Birth/Gender: 22-Dec-1945 (75 y.o. F) Treating RN: Dolan Amen Primary Care Physician: SYSTEM, PCP Other Clinician: Referring Physician: RAO, Astrid Divine Treating Physician/Extender: Yaakov Guthrie in Treatment: 1 Education Assessment Education Provided To: Patient Education Topics Provided Wound Debridement: Methods: Explain/Verbal Responses: State content correctly Wound/Skin Impairment: Methods: Explain/Verbal Responses: State content correctly Electronic  Signature(s) Signed: 07/13/2021 4:40:35 PM By: Dolan Amen  RN Entered By: Dolan Amen on 07/13/2021 16:02:25 Laura Santiago ([id number]) -------------------------------------------------------------------------------- Wound Assessment Details Patient Name: Laura Santiago, Laura Santiago. Date of Service: 07/13/2021 2:45 PM Medical Record Number: [id number] Patient Account Number: [id number] Date of Birth/Sex: 15-Feb-1946 (75 y.o. F) Treating RN: Dolan Amen Primary Care Abbie Jablon: SYSTEM, PCP Other Clinician: Referring Jizelle Conkey: RAO, Astrid Divine Treating Vennessa Affinito/Extender: Yaakov Guthrie in Treatment: 1 Wound Status Wound Number: 1 Primary Venous Leg Ulcer Etiology: Wound Location: Left, Medial Lower Leg Wound Status: Open Wounding Event: Gradually Appeared Comorbid Hypertension, Osteoarthritis, Received Chemotherapy, Date Acquired: 04/06/2021 History: Received Radiation Weeks Of Treatment: 1 Clustered Wound: Yes Photos Wound Measurements Length: (cm) 8.4 Width: (cm) 9.5 Depth: (cm) 0.2 Clustered Quantity: 4 Area: (cm) 62.675 Volume: (cm) 12.535 % Reduction in Area: 30.6% % Reduction in Volume: 30.6% Epithelialization: Large (67-100%) Tunneling: No Undermining: No Wound Description Classification: Full Thickness Without Exposed Support Structu Exudate Amount: Large Exudate Type: Serosanguineous Exudate Color: red, brown res Foul Odor After Cleansing: No Slough/Fibrino Yes Wound Bed Granulation Amount: Small (1-33%) Exposed Structure Granulation Quality: Red Fascia Exposed: No Necrotic Amount: Large (67-100%) Fat Layer (Subcutaneous Tissue) Exposed: Yes Necrotic Quality: Adherent Slough Tendon Exposed: No Muscle Exposed: No Joint Exposed: No Bone Exposed: No Treatment Notes Wound #1 (Lower Leg) Wound Laterality: Left, Medial Cleanser Soap and Water Discharge Instruction: Gently cleanse wound with antibacterial soap, rinse and pat dry prior to dressing  wounds Peri-Wound Care Laura Santiago, Laura Santiago ([id number]) Topical Gentamicin Discharge Instruction: Apply as directed by Heidee Audi. Santyl Collagenase Ointment, 30 (gm), tube Discharge Instruction: Apply nickel thick to wound bed only Primary Dressing Secondary Dressing Hydrofera Blue Ready Transfer Foam, 4x5 (in/in) Discharge Instruction: Apply to wound bed over non-stick dressing. Xtrasorb Large 6x9 (in/in) Discharge Instruction: Apply to wound as directed. Do not cut. Secured With Compression Wrap Profore Lite LF 3 Multilayer Compression Bandaging System Discharge Instruction: Apply 3 multi-layer wrap as prescribed. Compression Stockings Add-Ons Electronic Signature(s) Signed: 07/13/2021 4:40:35 PM By: Dolan Amen RN Entered By: Dolan Amen on 07/13/2021 15:31:41 Laura Santiago, Laura Santiago ([id number]) -------------------------------------------------------------------------------- Vitals Details Patient Name: Laura Santiago. Date of Service: 07/13/2021 2:45 PM Medical Record Number: [id number] Patient Account Number: [id number] Date of Birth/Sex: 29-Jan-1946 (75 y.o. F) Treating RN: Dolan Amen Primary Care Creedon Danielski: SYSTEM, PCP Other Clinician: Referring Weldon Nouri: RAO, Astrid Divine Treating Veretta Sabourin/Extender: Yaakov Guthrie in Treatment: 1 Vital Signs Time Taken: 15:23 Temperature (F): 98.4 Height (in): 66 Pulse (bpm): 87 Weight (lbs): 153 Respiratory Rate (breaths/min): 18 Body Mass Index (BMI): 24.7 Blood Pressure (mmHg): 160/73 Reference Range: 80 - 120 mg / dl Electronic Signature(s) Signed: 07/13/2021 4:40:35 PM By: Dolan Amen RN Entered By: Dolan Amen on 07/13/2021 15:24:58

## 2021-07-15 ENCOUNTER — Inpatient Hospital Stay: Payer: Medicare Other | Attending: Oncology

## 2021-07-15 ENCOUNTER — Inpatient Hospital Stay (HOSPITAL_BASED_OUTPATIENT_CLINIC_OR_DEPARTMENT_OTHER): Payer: Medicare Other | Admitting: Oncology

## 2021-07-15 ENCOUNTER — Telehealth: Payer: Self-pay | Admitting: Oncology

## 2021-07-15 ENCOUNTER — Ambulatory Visit: Payer: Medicare Other

## 2021-07-15 ENCOUNTER — Inpatient Hospital Stay: Payer: Medicare Other

## 2021-07-15 ENCOUNTER — Encounter: Payer: Self-pay | Admitting: Oncology

## 2021-07-15 ENCOUNTER — Other Ambulatory Visit: Payer: Medicare Other

## 2021-07-15 VITALS — BP 124/69 | HR 75 | Temp 98.1°F | Resp 16 | Ht 64.0 in | Wt 157.9 lb

## 2021-07-15 VITALS — BP 126/69 | HR 70 | Temp 97.5°F | Resp 18

## 2021-07-15 DIAGNOSIS — C787 Secondary malignant neoplasm of liver and intrahepatic bile duct: Secondary | ICD-10-CM | POA: Insufficient documentation

## 2021-07-15 DIAGNOSIS — C50919 Malignant neoplasm of unspecified site of unspecified female breast: Secondary | ICD-10-CM

## 2021-07-15 DIAGNOSIS — Z79891 Long term (current) use of opiate analgesic: Secondary | ICD-10-CM | POA: Insufficient documentation

## 2021-07-15 DIAGNOSIS — I10 Essential (primary) hypertension: Secondary | ICD-10-CM | POA: Diagnosis not present

## 2021-07-15 DIAGNOSIS — Z86718 Personal history of other venous thrombosis and embolism: Secondary | ICD-10-CM | POA: Diagnosis not present

## 2021-07-15 DIAGNOSIS — C779 Secondary and unspecified malignant neoplasm of lymph node, unspecified: Secondary | ICD-10-CM | POA: Insufficient documentation

## 2021-07-15 DIAGNOSIS — I878 Other specified disorders of veins: Secondary | ICD-10-CM | POA: Diagnosis not present

## 2021-07-15 DIAGNOSIS — N390 Urinary tract infection, site not specified: Secondary | ICD-10-CM | POA: Diagnosis not present

## 2021-07-15 DIAGNOSIS — Z7901 Long term (current) use of anticoagulants: Secondary | ICD-10-CM | POA: Diagnosis not present

## 2021-07-15 DIAGNOSIS — C50911 Malignant neoplasm of unspecified site of right female breast: Secondary | ICD-10-CM | POA: Insufficient documentation

## 2021-07-15 DIAGNOSIS — N184 Chronic kidney disease, stage 4 (severe): Secondary | ICD-10-CM | POA: Diagnosis not present

## 2021-07-15 DIAGNOSIS — C7951 Secondary malignant neoplasm of bone: Secondary | ICD-10-CM

## 2021-07-15 DIAGNOSIS — R9431 Abnormal electrocardiogram [ECG] [EKG]: Secondary | ICD-10-CM

## 2021-07-15 DIAGNOSIS — Z170 Estrogen receptor positive status [ER+]: Secondary | ICD-10-CM | POA: Insufficient documentation

## 2021-07-15 DIAGNOSIS — G893 Neoplasm related pain (acute) (chronic): Secondary | ICD-10-CM | POA: Diagnosis not present

## 2021-07-15 DIAGNOSIS — I83018 Varicose veins of right lower extremity with ulcer other part of lower leg: Secondary | ICD-10-CM | POA: Insufficient documentation

## 2021-07-15 DIAGNOSIS — Z5112 Encounter for antineoplastic immunotherapy: Secondary | ICD-10-CM | POA: Diagnosis present

## 2021-07-15 LAB — COMPREHENSIVE METABOLIC PANEL
ALT: 15 U/L (ref 0–44)
AST: 24 U/L (ref 15–41)
Albumin: 3.6 g/dL (ref 3.5–5.0)
Alkaline Phosphatase: 64 U/L (ref 38–126)
Anion gap: 7 (ref 5–15)
BUN: 24 mg/dL — ABNORMAL HIGH (ref 8–23)
CO2: 26 mmol/L (ref 22–32)
Calcium: 8.8 mg/dL — ABNORMAL LOW (ref 8.9–10.3)
Chloride: 106 mmol/L (ref 98–111)
Creatinine, Ser: 1.51 mg/dL — ABNORMAL HIGH (ref 0.44–1.00)
GFR, Estimated: 36 mL/min — ABNORMAL LOW (ref >60)
Glucose, Bld: 149 mg/dL — ABNORMAL HIGH (ref 70–99)
Potassium: 4.2 mmol/L (ref 3.5–5.1)
Sodium: 139 mmol/L (ref 135–145)
Total Bilirubin: 0.5 mg/dL (ref 0.3–1.2)
Total Protein: 6.6 g/dL (ref 6.5–8.1)

## 2021-07-15 LAB — CBC WITH DIFFERENTIAL/PLATELET
Abs Immature Granulocytes: 0.04 10*3/uL (ref 0.00–0.07)
Basophils Absolute: 0 10*3/uL (ref 0.0–0.1)
Basophils Relative: 0 %
Eosinophils Absolute: 0.2 10*3/uL (ref 0.0–0.5)
Eosinophils Relative: 5 %
HCT: 30.3 % — ABNORMAL LOW (ref 36.0–46.0)
Hemoglobin: 9.2 g/dL — ABNORMAL LOW (ref 12.0–15.0)
Immature Granulocytes: 1 %
Lymphocytes Relative: 12 %
Lymphs Abs: 0.6 10*3/uL — ABNORMAL LOW (ref 0.7–4.0)
MCH: 28.5 pg (ref 26.0–34.0)
MCHC: 30.4 g/dL (ref 30.0–36.0)
MCV: 93.8 fL (ref 80.0–100.0)
Monocytes Absolute: 0.4 10*3/uL (ref 0.1–1.0)
Monocytes Relative: 7 %
Neutro Abs: 4 10*3/uL (ref 1.7–7.7)
Neutrophils Relative %: 75 %
Platelets: 232 10*3/uL (ref 150–400)
RBC: 3.23 MIL/uL — ABNORMAL LOW (ref 3.87–5.11)
RDW: 16.9 % — ABNORMAL HIGH (ref 11.5–15.5)
WBC: 5.3 10*3/uL (ref 4.0–10.5)
nRBC: 0 % (ref 0.0–0.2)

## 2021-07-15 MED ORDER — HEPARIN SOD (PORK) LOCK FLUSH 100 UNIT/ML IV SOLN
500.0000 [IU] | Freq: Once | INTRAVENOUS | Status: DC | PRN
  Filled 2021-07-15: qty 5

## 2021-07-15 MED ORDER — SODIUM CHLORIDE 0.9 % IV SOLN
10.0000 mg | Freq: Once | INTRAVENOUS | Status: AC
  Administered 2021-07-15: 10 mg via INTRAVENOUS
  Filled 2021-07-15: qty 10

## 2021-07-15 MED ORDER — SODIUM CHLORIDE 0.9 % IV SOLN
65.0000 mg/m2 | Freq: Once | INTRAVENOUS | Status: AC
  Administered 2021-07-15: 114 mg via INTRAVENOUS
  Filled 2021-07-15: qty 19

## 2021-07-15 MED ORDER — SODIUM CHLORIDE 0.9% FLUSH
10.0000 mL | INTRAVENOUS | Status: DC | PRN
  Administered 2021-07-15: 10 mL via INTRAVENOUS
  Filled 2021-07-15: qty 10

## 2021-07-15 MED ORDER — SODIUM CHLORIDE 0.9 % IV SOLN
Freq: Once | INTRAVENOUS | Status: AC
  Filled 2021-07-15: qty 250

## 2021-07-15 MED ORDER — DIPHENHYDRAMINE HCL 50 MG/ML IJ SOLN
50.0000 mg | Freq: Once | INTRAMUSCULAR | Status: AC
  Administered 2021-07-15: 50 mg via INTRAVENOUS
  Filled 2021-07-15: qty 1

## 2021-07-15 MED ORDER — FAMOTIDINE 20 MG IN NS 100 ML IVPB
20.0000 mg | Freq: Once | INTRAVENOUS | Status: AC
Start: 2021-07-15 — End: 2021-07-15
  Administered 2021-07-15: 20 mg via INTRAVENOUS
  Filled 2021-07-15: qty 20

## 2021-07-15 MED ORDER — HEPARIN SOD (PORK) LOCK FLUSH 100 UNIT/ML IV SOLN
500.0000 [IU] | Freq: Once | INTRAVENOUS | Status: AC
  Administered 2021-07-15: 500 [IU] via INTRAVENOUS
  Filled 2021-07-15: qty 5

## 2021-07-15 MED ORDER — HEPARIN SOD (PORK) LOCK FLUSH 100 UNIT/ML IV SOLN
INTRAVENOUS | Status: AC
  Filled 2021-07-15: qty 5

## 2021-07-15 NOTE — Patient Instructions (Signed)
[facility]  Discharge Instructions: Thank you for choosing [facility] to provide your oncology and hematology care.  If you have a lab appointment with the Cancer Center, please go directly to the Cancer Center and check in at the registration area.  Wear comfortable clothing and clothing appropriate for easy access to any Portacath or PICC line.   We strive to give you quality time with your provider. You may need to reschedule your appointment if you arrive late (15 or more minutes).  Arriving late affects you and other patients whose appointments are after yours.  Also, if you miss three or more appointments without notifying the office, you may be dismissed from the clinic at the provider's discretion.      For prescription refill requests, have your pharmacy contact our office and allow 72 hours for refills to be completed.    Today you received the following chemotherapy and/or immunotherapy agents Taxol        To help prevent nausea and vomiting after your treatment, we encourage you to take your nausea medication as directed.  BELOW ARE SYMPTOMS THAT SHOULD BE REPORTED IMMEDIATELY: *FEVER GREATER THAN 100.4 F (38 C) OR HIGHER *CHILLS OR SWEATING *NAUSEA AND VOMITING THAT IS NOT CONTROLLED WITH YOUR NAUSEA MEDICATION *UNUSUAL SHORTNESS OF BREATH *UNUSUAL BRUISING OR BLEEDING *URINARY PROBLEMS (pain or burning when urinating, or frequent urination) *BOWEL PROBLEMS (unusual diarrhea, constipation, pain near the anus) TENDERNESS IN MOUTH AND THROAT WITH OR WITHOUT PRESENCE OF ULCERS (sore throat, sores in mouth, or a toothache) UNUSUAL RASH, SWELLING OR PAIN  UNUSUAL VAGINAL DISCHARGE OR ITCHING   Items with * indicate a potential emergency and should be followed up as soon as possible or go to the Emergency Department if any problems should occur.  Please show the CHEMOTHERAPY ALERT CARD or IMMUNOTHERAPY ALERT CARD at check-in to  the Emergency Department and triage nurse.  Should you have questions after your visit or need to cancel or reschedule your appointment, please contact [facility]  [phone] and follow the prompts.  Office hours are 8:00 a.m. to 4:30 p.m. Monday - Friday. Please note that voicemails left after 4:00 p.m. may not be returned until the following business day.  We are closed weekends and major holidays. You have access to a nurse at all times for urgent questions. Please call the main number to the clinic [phone] and follow the prompts.  For any non-urgent questions, you may also contact your provider using MyChart. We now offer e-Visits for anyone 18 and older to request care online for non-urgent symptoms. For details visit [email or web address].   Also download the MyChart app! Go to the app store, search "MyChart", open the app, select [facility], and log in with your MyChart username and password.  Due to Covid, a mask is required upon entering the hospital/clinic. If you do not have a mask, one will be given to you upon arrival. For doctor visits, patients may have 1 support person aged 18 or older with them. For treatment visits, patients cannot have anyone with them due to current Covid guidelines and our immunocompromised population.  

## 2021-07-15 NOTE — Progress Notes (Signed)
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  She had a screening mammogram in September 2014 which showed 2.1 x 2.3 x 1.8 cm irregular mass in her right breast.  It was ER 95% positive PR negative and HER2 positive +3.  She received neoadjuvant chemotherapy with Taxol Herceptin and Perjeta for 4 cycles followed by dose dense AC/Herceptin x4 which she completed in March 2015.  She had a right lumpectomy on 04/03/2014 which showed scant residual invasive ductal carcinoma [id number].  She completed 1 year of adjuvant Herceptin chemotherapy and also completed adjuvant radiation treatment.  She was recommended anastrozole which she took on and off starting November 2015 and stopped sometime in 2020.   She was then hospitalized with neck pain and was found to have lytic lesions involving C5-C6 with pathological vertebral fractures.  She underwent radiation treatment to this area.  Image guided biopsy of the L1 vertebral body showed metastatic carcinoma consistent with breast origin ER 10% PR 0% and HER2 amplified ratio 5.1 average HER2 signal number per cell 15.0 average CEP 17 signals number per cell 3.0.  Baseline echocardiogram on 05/02/2021 showed a normal EF of 62% she was recommended Taxol Herceptin and Perjeta which she received for 2 cycles at [facility] until June 10, 2021   She has  chronic pain from her bone metastases for which she is currently on oxycodone 10 mg every 4 hours as needed and 12 mcg fentanyl patch.  She was seeing pain clinic when she was living in [name].   Patient has also been on Zometa when she was in [name] but she does have some ongoing dental issues.  She has received Xgeva in the past as well.  Her last Delton See was in July 2021.  Last PET scan was on 03/21/2021 which showed diffuse osseous metastatic disease involving the head neck chest abdomen and pelvis and spine.  Left lung apex hypermetabolic nodule and multiple hypermetabolic liver lesions concerning for disease involvement.  Enlarged hypermetabolic left inguinal lymph nodes along with hypermetabolic external iliac and left supraclavicular lymph nodes   Patient is now moved to [place] to be close to her daughter.  She lives in an independent living.    Interval history-wound care is seeing her weekly for her chronic left lower extremity wounds.  Patient reports a feeling like her skin is moving over her head and falls over her eyes making it difficult for her to see.  Denies any overt skin rash.  Reports still desired to keep rubbing her eyes to clear of flakiness from her face.  ECOG PS- 2 Pain scale- 0   Review of systems- Review of Systems  Constitutional:  Positive for malaise/fatigue. Negative for chills, fever and weight loss.  HENT:  Negative for congestion, ear discharge and nosebleeds.   Eyes:  Negative for blurred vision.  Respiratory:  Negative for cough, hemoptysis, sputum production, shortness of breath and wheezing.  Cardiovascular:  Negative for chest pain, palpitations, orthopnea and claudication.  Gastrointestinal:  Negative for abdominal pain, blood in stool, constipation, diarrhea, heartburn, melena, nausea and vomiting.  Genitourinary:  Negative for dysuria, flank pain, frequency, hematuria and urgency.  Musculoskeletal:  Negative for back pain, joint pain and myalgias.   Skin:  Negative for rash.  Neurological:  Negative for dizziness, tingling, focal weakness, seizures, weakness and headaches.  Endo/Heme/Allergies:  Does not bruise/bleed easily.  Psychiatric/Behavioral:  Negative for depression and suicidal ideas. The patient does not have insomnia.       Allergies  Allergen Reactions   Corticosteroids Other (See Comments)    Pt trf from [place] and per primary md for her cancer tx. Notes that it causes agitation intolerance   Sulfa Antibiotics Other (See Comments)    Pt moved from [place] and in MD notes she has allergy but we do not know reactions when taking the drug   Celebrex [Celecoxib] Rash     Past Medical History:  Diagnosis Date   ADHD (attention deficit hyperactivity disorder)    in [place], no date on md note   Anemia    IDA 11/26/2019, Anemia in stage 4 chronic kidney disease (HCC) 09/03/2020   Arthritis    osteoarthritis right knee 09/30/2014   Breast cancer (HCC) 10/05/2013   in [place], [place] -, Her 2 is 3+   Cancer related pain 03/28/2021   in [place], md notes spine mets   DVT of lower extremity, bilateral (HCC) 03/30/2014   in [place]   Generalized muscle weakness 03/31/2016   in [place]   GERD (gastroesophageal reflux disease) 05/15/2013   per md in [place]   Hyperparathyroidism, secondary (HCC) 04/25/2018   in [place]   Hypertension 02/20/2016   info from MD in [facility]   Lumbar compression fracture (HCC) 03/18/2021   in [place]   Memory loss 02/05/2015   in [place]   Metabolic acidosis 01/27/2018   in [place]   Metabolic syndrome 02/20/2016   in [place]   Osteopenia after menopause 03/29/2016   in [place]   Pseudophakia of both eyes 09/30/2012   per md in [place] where pt. 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   in [place]   Stasis ulcer of left lower extremity (HCC) 03/29/2016   in [place]     Past Surgical History:  Procedure Laterality Date   CESAREAN SECTION     unknown   fibroid removed  N/A    in [place] - unknown date    IR PORT REPAIR CENTRAL VENOUS ACCESS DEVICE Left    In [place]   MOHS SURGERY N/A 02/25/2014   in [place]   ovary removed      unknown   REMV CATARACT EXTRACAP,INSERT LENS Bilateral  Bilateral 09/05/2012   in [place]   TONSILLECTOMY     unknown    LUMPECTOMY Right 04/03/2014   in [place]    Social History   Socioeconomic History   Marital status: Divorced    Spouse name: Not on file   Number of children: Not on file   Years of education: Not on file   Highest education level: Not on file  Occupational History   Occupation: retired Chief Financial Officer    Comment: In [employer]  Tobacco Use   Smoking status: Never   Smokeless tobacco: Never  Vaping Use   Vaping Use: Never used  Substance and Sexual Activity   Alcohol use: Not Currently  Drug use: Never   Sexual activity: Not Currently  Other Topics Concern   Not on file  Social History Narrative   Not on file   Social Determinants of Health   Financial Resource Strain: Not on file  Food Insecurity: Not on file  Transportation Needs: Not on file  Physical Activity: Not on file  Stress: Not on file  Social Connections: Not on file  Intimate Partner Violence: Not on file    Family History  Problem Relation Age of Onset   Pancreatic cancer Mother    Stroke Father    Diabetes Father    Hypertension Father    Heart disease Father    Skin cancer Father    Varicose Veins Father    Skin cancer Brother    Cancer - Prostate Brother      Current Outpatient Medications:    acetaminophen (TYLENOL) 500 MG tablet, Take 500 mg by mouth every 6 (six) hours as needed for mild pain ($RemoveBe'500mg'hCgOrnwlA$  to $R'1000mg'yl$  Q6 hours PRN)., Disp: , Rfl:    Calcium 200 MG TABS, Take 1 tablet by mouth daily., Disp: , Rfl:    fentaNYL (DURAGESIC) 12 MCG/HR, Place 1 patch onto the skin every 3 (three) days., Disp: , Rfl:    gabapentin (NEURONTIN) 400 MG capsule, Take 400 mg by mouth 2 (two) times daily., Disp: , Rfl:    lidocaine-prilocaine (EMLA) cream,  Apply to affected area once, Disp: 30 g, Rfl: 3   lisinopril (ZESTRIL) 20 MG tablet, Take 20 mg by mouth daily., Disp: , Rfl:    Multiple Vitamin (MULTIVITAMIN ADULT PO), Take 1 tablet by mouth daily., Disp: , Rfl:    ondansetron (ZOFRAN) 8 MG tablet, Take 1 tablet (8 mg total) by mouth 2 (two) times daily as needed (Nausea or vomiting)., Disp: 30 tablet, Rfl: 1   Oxycodone HCl 10 MG TABS, Take 1 tablet (10 mg total) by mouth every 4 (four) hours as needed., Disp: 84 tablet, Rfl: 0   prochlorperazine (COMPAZINE) 10 MG tablet, Take 1 tablet (10 mg total) by mouth every 6 (six) hours as needed (Nausea or vomiting)., Disp: 30 tablet, Rfl: 1   rivaroxaban (XARELTO) 20 MG TABS tablet, Take 20 mg by mouth daily with supper., Disp: , Rfl:  No current facility-administered medications for this visit.  Facility-Administered Medications Ordered in Other Visits:    heparin lock flush 100 unit/mL, 500 Units, Intracatheter, Once PRN, [name], MD   sodium chloride flush (NS) 0.9 % injection 10 mL, 10 mL, Intravenous, PRN, Cammie Sickle, MD, 10 mL at 07/15/21 1003  Physical exam:  Vitals:   07/15/21 1035 07/15/21 1150  BP:  124/69  Pulse:  75  Resp:  16  Temp:  98.1 F (36.7 C)  TempSrc:  Oral  Weight: 157 lb 14.4 oz (71.6 kg) 157 lb 14.4 oz (71.6 kg)  Height: $Remove'5\' 4"'AImOUvH$  (1.626 m) $RemoveB'5\' 4"'qdICoaDd$  (1.626 m)   Physical Exam Constitutional:      General: She is not in acute distress. Cardiovascular:     Rate and Rhythm: Normal rate and regular rhythm.     Heart sounds: Normal heart sounds.  Pulmonary:     Effort: Pulmonary effort is normal.     Breath sounds: Normal breath sounds.  Abdominal:     General: Bowel sounds are normal.     Palpations: Abdomen is soft.  Musculoskeletal:     Comments: Dressing in place over left lower extremity  Skin:    General:  Skin is warm and dry.     Comments: I did not notice any skin rash, excoriation or redness over her scalp or face.  Neurological:      Mental Status: She is alert and oriented to person, place, and time.     CMP Latest Ref Rng & Units 07/15/2021  Glucose 70 - 99 mg/dL 149(H)  BUN 8 - 23 mg/dL 24(H)  Creatinine 0.44 - 1.00 mg/dL 1.51(H)  Sodium 135 - 145 mmol/L 139  Potassium 3.5 - 5.1 mmol/L 4.2  Chloride 98 - 111 mmol/L 106  CO2 22 - 32 mmol/L 26  Calcium 8.9 - 10.3 mg/dL 8.8(L)  Total Protein 6.5 - 8.1 g/dL 6.6  Total Bilirubin 0.3 - 1.2 mg/dL 0.5  Alkaline Phos 38 - 126 U/L 64  AST 15 - 41 U/L 24  ALT 0 - 44 U/L 15   CBC Latest Ref Rng & Units 07/15/2021  WBC 4.0 - 10.5 K/uL 5.3  Hemoglobin 12.0 - 15.0 g/dL 9.2(L)  Hematocrit 36.0 - 46.0 % 30.3(L)  Platelets 150 - 400 K/uL 232    No images are attached to the encounter.  DG Chest 2 View  Result Date: 07/01/2021 CLINICAL DATA:  Port-A-Cath placement. EXAM: CHEST - 2 VIEW COMPARISON:  None. FINDINGS: The heart size and mediastinal contours are within normal limits. Both lungs are clear. Left internal jugular Port-A-Cath is noted with distal tip in expected position of the SVC. No pneumothorax or pleural effusion is noted. The visualized skeletal structures are unremarkable. IMPRESSION: Left subclavian Port-A-Cath is noted as described above. Electronically Signed   By: Marijo Conception M.D.   On: 07/01/2021 12:11   VAS [place] ABI WITH/WO TBI  Result Date: 07/11/2021  LOWER EXTREMITY DOPPLER STUDY Patient Name:  FAVIOLA KLARE  Date of Exam:   07/11/2021 Medical Rec #: [id number]         Accession #:    [id number] Date of Birth: 1946-02-09         Patient Gender: F Patient Age:   42Y Exam Location:  [facility] Procedure:      VAS [place] ABI WITH/WO TBI Referring Phys: [id number] JESSICA RATLIFF HOFFMAN --------------------------------------------------------------------------------  Indications: Swelling; on chemo. Other Factors: Left leg slow healing wound.  Performing Technologist: Concha Norway RVT  Examination Guidelines: A complete evaluation includes at minimum,  Doppler waveform signals and systolic blood pressure reading at the level of bilateral brachial, anterior tibial, and posterior tibial arteries, when vessel segments are accessible. Bilateral testing is considered an integral part of a complete examination. Photoelectric Plethysmograph (PPG) waveforms and toe systolic pressure readings are included as required and additional duplex testing as needed. Limited examinations for reoccurring indications may be performed as noted.  ABI Findings: +---------+------------------+-----+---------+--------+ Right    Rt Pressure (mmHg)IndexWaveform Comment  +---------+------------------+-----+---------+--------+ Brachial 142                                      +---------+------------------+-----+---------+--------+ ATA      151               1.06 triphasic         +---------+------------------+-----+---------+--------+ PTA      165               1.16 triphasic         +---------+------------------+-----+---------+--------+ [facility]  0.96 Normal            +---------+------------------+-----+---------+--------+ +---------+------------------+-----+---------+-------+ Left     Lt Pressure (mmHg)IndexWaveform Comment +---------+------------------+-----+---------+-------+ ATA      163               1.15 triphasic        +---------+------------------+-----+---------+-------+ PTA      181               1.27 triphasic        +---------+------------------+-----+---------+-------+ Cleotis Nipper               0.89 Normal           +---------+------------------+-----+---------+-------+ +-------+-----------+-----------+------------+------------+ ABI/TBIToday's ABIToday's TBIPrevious ABIPrevious TBI +-------+-----------+-----------+------------+------------+ Right                                                 +-------+-----------+-----------+------------+------------+  Summary: Right: Resting right  ankle-brachial index is within normal range. No evidence of significant right lower extremity arterial disease. The right toe-brachial index is normal. Left: Resting left ankle-brachial index is within normal range. No evidence of significant left lower extremity arterial disease. The left toe-brachial index is normal.  *See table(s) above for measurements and observations.  Electronically signed by Hortencia Pilar MD on 07/11/2021 at 5:08:34 PM.    Final      Assessment and plan- Patient is a 75 y.o. female with history of HER2 positive breast cancer in 2014 now diagnosed with diffuse areas of bone liver and lymph node metastases ER weakly +10%, PR negative and HER2 positive.  Patient started on Taxol Herceptin and Perjeta in [name] and now moved to [place].  She is here for on treatment assessment prior to cycle 1 day 8 of Taxol chemotherapy  Patient received cycle 1 of Taxol Herceptin and Perjeta last week and will proceed with Taxol chemotherapy today.  Return to clinic in 1 week and receive Taxol alone.  I will see her back in 2 weeks with port labs CBC with differential, CMP for Taxol Herceptin and Perjeta.  She will need repeat CT chest abdomen pelvis without contrast as well as echocardiogram in 3 weeks time.  We will see if we can get hold of her dentist and limit her to get his dental clearance before restarting Zometa.  She does have some baseline CKD as well which needs to be taken into account.  Neoplasm related pain: Continue fentanyl patch and as needed oxycodone.  Fentanyl patch prescription will be renewed today.  History of DVT: Continue Xarelto   Visit Diagnosis 1. Metastatic breast cancer ([place])   2. Bone metastases ([place])   3. Abnormal electrocardiogram (ECG) (EKG)       Dr. Randa Evens, MD, MPH [facility] [phone] 07/15/2021 4:39 PM

## 2021-07-15 NOTE — Telephone Encounter (Signed)
Spoke with patient's daughter Laura Santiago to confirm upcoming appts and scans. She confirmed all days and times and requested a print out be sent to her home. Sending in the mail today.

## 2021-07-17 ENCOUNTER — Other Ambulatory Visit: Payer: Self-pay | Admitting: *Deleted

## 2021-07-18 ENCOUNTER — Encounter: Payer: Self-pay | Admitting: Oncology

## 2021-07-18 MED ORDER — RIVAROXABAN 20 MG PO TABS: 20.0000 mg | ORAL_TABLET | Freq: Every day | ORAL | 3 refills | Status: DC

## 2021-07-18 MED ORDER — AMPHETAMINE-DEXTROAMPHET ER 30 MG PO CP24: 30.0000 mg | ORAL_CAPSULE | Freq: Every day | ORAL | 0 refills | Status: DC

## 2021-07-18 MED ORDER — FENTANYL 12 MCG/HR TD PT72: 1.0000 | MEDICATED_PATCH | TRANSDERMAL | 0 refills | Status: DC

## 2021-07-19 ENCOUNTER — Ambulatory Visit: Payer: Medicare Other

## 2021-07-20 ENCOUNTER — Telehealth: Payer: Self-pay | Admitting: *Deleted

## 2021-07-20 ENCOUNTER — Other Ambulatory Visit: Payer: Self-pay

## 2021-07-20 ENCOUNTER — Inpatient Hospital Stay (HOSPITAL_BASED_OUTPATIENT_CLINIC_OR_DEPARTMENT_OTHER): Payer: Medicare Other | Admitting: Hospice and Palliative Medicine

## 2021-07-20 ENCOUNTER — Inpatient Hospital Stay: Payer: Medicare Other

## 2021-07-20 ENCOUNTER — Ambulatory Visit: Payer: Medicare Other

## 2021-07-20 VITALS — BP 159/69 | HR 72 | Temp 99.3°F | Resp 18

## 2021-07-20 DIAGNOSIS — C50919 Malignant neoplasm of unspecified site of unspecified female breast: Secondary | ICD-10-CM

## 2021-07-20 DIAGNOSIS — N343 Urethral syndrome, unspecified: Secondary | ICD-10-CM

## 2021-07-20 DIAGNOSIS — N390 Urinary tract infection, site not specified: Secondary | ICD-10-CM

## 2021-07-20 DIAGNOSIS — Z5112 Encounter for antineoplastic immunotherapy: Secondary | ICD-10-CM | POA: Diagnosis not present

## 2021-07-20 LAB — CBC WITH DIFFERENTIAL/PLATELET
Abs Immature Granulocytes: 0.02 10*3/uL (ref 0.00–0.07)
Basophils Absolute: 0 10*3/uL (ref 0.0–0.1)
Basophils Relative: 0 %
Eosinophils Absolute: 0.1 10*3/uL (ref 0.0–0.5)
Eosinophils Relative: 1 %
HCT: 30.8 % — ABNORMAL LOW (ref 36.0–46.0)
Hemoglobin: 9.6 g/dL — ABNORMAL LOW (ref 12.0–15.0)
Immature Granulocytes: 1 %
Lymphocytes Relative: 13 %
Lymphs Abs: 0.5 10*3/uL — ABNORMAL LOW (ref 0.7–4.0)
MCH: 29.4 pg (ref 26.0–34.0)
MCHC: 31.2 g/dL (ref 30.0–36.0)
MCV: 94.5 fL (ref 80.0–100.0)
Monocytes Absolute: 0.1 10*3/uL (ref 0.1–1.0)
Monocytes Relative: 3 %
Neutro Abs: 3.3 10*3/uL (ref 1.7–7.7)
Neutrophils Relative %: 82 %
Platelets: 198 10*3/uL (ref 150–400)
RBC: 3.26 MIL/uL — ABNORMAL LOW (ref 3.87–5.11)
RDW: 16.5 % — ABNORMAL HIGH (ref 11.5–15.5)
WBC: 4 10*3/uL (ref 4.0–10.5)
nRBC: 0 % (ref 0.0–0.2)

## 2021-07-20 LAB — URINALYSIS, COMPLETE (UACMP) WITH MICROSCOPIC
Bilirubin Urine: NEGATIVE
Glucose, UA: NEGATIVE mg/dL
Hgb urine dipstick: NEGATIVE
Ketones, ur: NEGATIVE mg/dL
Nitrite: NEGATIVE
Protein, ur: NEGATIVE mg/dL
Specific Gravity, Urine: 1.016 (ref 1.005–1.030)
pH: 6 (ref 5.0–8.0)

## 2021-07-20 LAB — COMPREHENSIVE METABOLIC PANEL
ALT: 11 U/L (ref 0–44)
AST: 19 U/L (ref 15–41)
Albumin: 3.6 g/dL (ref 3.5–5.0)
Alkaline Phosphatase: 64 U/L (ref 38–126)
Anion gap: 6 (ref 5–15)
BUN: 15 mg/dL (ref 8–23)
CO2: 27 mmol/L (ref 22–32)
Calcium: 8.6 mg/dL — ABNORMAL LOW (ref 8.9–10.3)
Chloride: 103 mmol/L (ref 98–111)
Creatinine, Ser: 0.99 mg/dL (ref 0.44–1.00)
GFR, Estimated: 59 mL/min — ABNORMAL LOW (ref >60)
Glucose, Bld: 121 mg/dL — ABNORMAL HIGH (ref 70–99)
Potassium: 4.3 mmol/L (ref 3.5–5.1)
Sodium: 136 mmol/L (ref 135–145)
Total Bilirubin: 0.3 mg/dL (ref 0.3–1.2)
Total Protein: 6.3 g/dL — ABNORMAL LOW (ref 6.5–8.1)

## 2021-07-20 MED ORDER — CEPHALEXIN 500 MG PO CAPS: 500.0000 mg | ORAL_CAPSULE | Freq: Two times a day (BID) | ORAL | 0 refills | Status: DC

## 2021-07-20 NOTE — Progress Notes (Signed)
Has been experiencing urinary symptoms for about a week now. Bad odor, diminished urine output. Having some burning with urination. States she drove 5 days in a car from [place]. Thinks that the long cr ride started it. Having regular BMs. States her vision is different and her grip is off. Explained these 2 symptoms could be related to chemotherapy. Stressed need to increase liquids esp while on chemotherapy. Low grade temps 99. No cough . No dyspnea.

## 2021-07-20 NOTE — Telephone Encounter (Signed)
Daughter called to report that the patient" eyes are swollen to the point that she is having difficulty seeing, she has burning with urination and has a temp of 100.

## 2021-07-20 NOTE — Progress Notes (Signed)
Symptom Management [place]  Telephone:([phone] [phone]  Patient Care Team: Pcp, No as PCP - General   Name of the patient: Laura Santiago  [id number]  1946/05/10   Date of visit: 07/20/21  Reason for Consult:  Ms. 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  Patient was initially diagnosed with breast cancer in 2014 and received neoadjuvant chemotherapy followed by lumpectomy, adjuvant chemotherapy/radiation, and was intermittently compliant with anastrozole.  Patient was hospitalized in 2022 with neck pain and found to have lytic lesions involving C5-C6 with pathologic vertebral fractures.  She underwent radiation.  Biopsy was consistent with metastatic breast cancer.  Patient was started in [name] on chemotherapy with Taxol, Herceptin, and Perjeta.  Patient last saw Dr. Janese Banks 07/15/2021 at which point she appeared to be at baseline.  She is being followed actively by wound center.  Plan is to continue Taxol, Herceptin, Perjeta chemotherapy.  She has chronic neoplasm related pain and is being managed on transdermal fentanyl and oxycodone.  Patient presents to [facility] today with complaint of urinary burning and foul-smelling urine.  She reports that symptoms have been present 7 to 10 days and have slowly worsened over time.  She also reports some mild confusion and weakness.  She has had some flank discomfort, primarily in the right side.  Denies any neurologic complaints. Denies recent fevers or illnesses. Denies any easy bleeding or bruising. Reports good appetite and denies weight loss. Denies chest pain. Denies any nausea, vomiting, constipation, or diarrhea. Patient offers no further specific complaints today.    PAST MEDICAL HISTORY: Past Medical  History:  Diagnosis Date   ADHD (attention deficit hyperactivity disorder)    in [place], no date on md note   Anemia    IDA 11/26/2019, Anemia in stage 4 chronic kidney disease ([place]) 09/03/2020   Arthritis    osteoarthritis right knee 09/30/2014   Breast cancer ([place]) 10/05/2013   in [place] +, [place] -, Her 2 is 3+   Cancer related pain 03/28/2021   in [name], md notes spine mets   DVT of lower extremity, bilateral ([place]) 03/30/2014   in [name]   Generalized muscle weakness 03/31/2016   in [name]   GERD (gastroesophageal reflux disease) 05/15/2013   per md in [name]   Hyperparathyroidism, secondary ([place]) 04/25/2018   in [name]   Hypertension 02/20/2016   info from MD in [facility]   Lumbar compression fracture ([place]) 03/18/2021   in [place] loss 02/05/2015   in [name]   Metabolic acidosis 83/66/2947   in [place]   Metabolic syndrome 65/46/5035   in [name]   Osteopenia after menopause 03/29/2016   in [place] of both eyes 09/30/2012   per md in [name] where pt. 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   in [name]   Stasis ulcer of left lower extremity ([place]) 03/29/2016   in [place]:  Past Surgical History:  Procedure Laterality Date   CESAREAN SECTION     unknown   fibroid removed  N/A    in [place] - [place] date   IR PORT REPAIR CENTRAL VENOUS ACCESS DEVICE Left    In [place] N/A 02/25/2014   in [name]   [place]  removed      unknown   REMV CATARACT EXTRACAP,INSERT LENS Bilateral  Bilateral 09/05/2012   in [place]     unknown    LUMPECTOMY Right 04/03/2014   in [place]    HEMATOLOGY/ONCOLOGY HISTORY:  Oncology History  Metastatic breast cancer [facility])  2014 Initial Diagnosis   history of breast cancer diagnosed in 2014 s/p chemotherapy with Herceptin and lumpectomy, radiation and antiestrogen therapy that was completed in 2020 (in [name])   03/18/2021 - 03/24/2021 Hospital Admission   New neck and shoulder pain: Scans  revealed lytic lesions C5-C6 with pathological compression fractures (developed steroid-induced mania) status post 1 round of radiation (Dr. Sondra Come in [place] was her medical oncologist)   04/19/2021 Relapse/Recurrence   Biopsy of L1 vertebral body on 04/19/21 showed metastatic carcinoma consistent with breast origin involving bone: ER 10%, PR 0%, HER2 ratio 5.1)   05/06/2021 -  Chemotherapy   Taxol (weekly) Herceptin Perjeta palliative chemotherapy started in [name], moved to be closer to her family in [place]       06/28/2021 Cancer Staging   Staging form: Breast, AJCC 8th Edition - Clinical: Stage IV (pM1, ER+, PR-, HER2+) - Signed by [name], MD on 06/28/2021    07/01/2021 -  Chemotherapy    Patient is on Treatment Plan: BREAST WEEKLY PACLITAXEL + TRASTUZUMAB + PERTUZUMAB Q21D X 8 CYCLES / TRASTUZUMAB + PERTUZUMAB Q21D X 4 CYCLES         ALLERGIES:  is allergic to corticosteroids, sulfa antibiotics, and celebrex [celecoxib].  MEDICATIONS:  Current Outpatient Medications  Medication Sig Dispense Refill   acetaminophen (TYLENOL) 500 MG tablet Take 500 mg by mouth every 6 (six) hours as needed for mild pain ($RemoveBe'500mg'vNIbRMCZg$  to $R'1000mg'Yf$  Q6 hours PRN).     amphetamine-dextroamphetamine (ADDERALL XR) 30 MG 24 hr capsule Take 1 capsule (30 mg total) by mouth daily. 30 capsule 0   Calcium 200 MG TABS Take 1 tablet by mouth daily.     fentaNYL (DURAGESIC) 12 MCG/HR Place 1 patch onto the skin every 3 (three) days. 10 patch 0   gabapentin (NEURONTIN) 400 MG capsule Take 400 mg by mouth 2 (two) times daily.     lidocaine-prilocaine (EMLA) cream Apply to affected area once 30 g 3   lisinopril (ZESTRIL) 20 MG tablet Take 20 mg by mouth daily.     Multiple Vitamin (MULTIVITAMIN ADULT PO) Take 1 tablet by mouth daily.     ondansetron (ZOFRAN) 8 MG tablet Take 1 tablet (8 mg total) by mouth 2 (two) times daily as needed (Nausea or vomiting). 30 tablet 1   Oxycodone HCl 10 MG TABS Take 1 tablet (10 mg  total) by mouth every 4 (four) hours as needed. 84 tablet 0   prochlorperazine (COMPAZINE) 10 MG tablet Take 1 tablet (10 mg total) by mouth every 6 (six) hours as needed (Nausea or vomiting). 30 tablet 1   rivaroxaban (XARELTO) 20 MG TABS tablet Take 1 tablet (20 mg total) by mouth daily with supper. 30 tablet 3   No current facility-administered medications for this visit.    VITAL SIGNS: BP (!) 159/69 (BP Location: Left Arm, Patient Position: Sitting)   Pulse 72   Temp 99.3 F (37.4 C) (Tympanic)   Resp 18   SpO2 100%  There were no vitals filed for this visit.  Estimated body mass index is 27.1 kg/m as calculated from the following:   Height as of 07/15/21: $RemoveBe'5\' 4"'ZcBHYKeln$  (1.626 m).  Weight as of 07/15/21: 157 lb 14.4 oz (71.6 kg).  LABS: CBC:    Component Value Date/Time   WBC 4.0 07/20/2021 1535   HGB 9.6 (L) 07/20/2021 1535   HCT 30.8 (L) 07/20/2021 1535   PLT 198 07/20/2021 1535   MCV 94.5 07/20/2021 1535   NEUTROABS 3.3 07/20/2021 1535   LYMPHSABS 0.5 (L) 07/20/2021 1535   MONOABS 0.1 07/20/2021 1535   EOSABS 0.1 07/20/2021 1535   BASOSABS 0.0 07/20/2021 1535   Comprehensive Metabolic Panel:    Component Value Date/Time   NA 139 07/15/2021 1001   K 4.2 07/15/2021 1001   CL 106 07/15/2021 1001   CO2 26 07/15/2021 1001   BUN 24 (H) 07/15/2021 1001   CREATININE 1.51 (H) 07/15/2021 1001   GLUCOSE 149 (H) 07/15/2021 1001   CALCIUM 8.8 (L) 07/15/2021 1001   AST 24 07/15/2021 1001   ALT 15 07/15/2021 1001   ALKPHOS 64 07/15/2021 1001   BILITOT 0.5 07/15/2021 1001   PROT 6.6 07/15/2021 1001   ALBUMIN 3.6 07/15/2021 1001    RADIOGRAPHIC STUDIES: DG Chest 2 View  Result Date: 07/01/2021 CLINICAL DATA:  Port-A-Cath placement. EXAM: CHEST - 2 VIEW COMPARISON:  None. FINDINGS: The heart size and mediastinal contours are within normal limits. Both lungs are clear. Left internal jugular Port-A-Cath is noted with distal tip in expected position of the SVC. No pneumothorax or  pleural effusion is noted. The visualized skeletal structures are unremarkable. IMPRESSION: Left subclavian Port-A-Cath is noted as described above. Electronically Signed   By: Marijo Conception M.D.   On: 07/01/2021 12:11   VAS [place] ABI WITH/WO TBI  Result Date: 07/11/2021  LOWER EXTREMITY DOPPLER STUDY Patient Name:  DIVINITY KYLER  Date of Exam:   07/11/2021 Medical Rec #: [id number]         Accession #:    [id number] Date of Birth: 07-24-1946         Patient Gender: F Patient Age:   58Y Exam Location:  [facility] Procedure:      VAS [place] ABI WITH/WO TBI Referring Phys: [id number] JESSICA RATLIFF HOFFMAN --------------------------------------------------------------------------------  Indications: Swelling; on chemo. Other Factors: Left leg slow healing wound.  Performing Technologist: Concha Norway RVT  Examination Guidelines: A complete evaluation includes at minimum, Doppler waveform signals and systolic blood pressure reading at the level of bilateral brachial, anterior tibial, and posterior tibial arteries, when vessel segments are accessible. Bilateral testing is considered an integral part of a complete examination. Photoelectric Plethysmograph (PPG) waveforms and toe systolic pressure readings are included as required and additional duplex testing as needed. Limited examinations for reoccurring indications may be performed as noted.  ABI Findings: +---------+------------------+-----+---------+--------+ Right    Rt Pressure (mmHg)IndexWaveform Comment  +---------+------------------+-----+---------+--------+ Brachial 142                                      +---------+------------------+-----+---------+--------+ ATA      151               1.06 triphasic         +---------+------------------+-----+---------+--------+ PTA      165               1.16 triphasic         +---------+------------------+-----+---------+--------+ Great Toe137               0.96 Normal             +---------+------------------+-----+---------+--------+ +---------+------------------+-----+---------+-------+  Left     Lt Pressure (mmHg)IndexWaveform Comment +---------+------------------+-----+---------+-------+ ATA      163               1.15 triphasic        +---------+------------------+-----+---------+-------+ PTA      181               1.27 triphasic        +---------+------------------+-----+---------+-------+ Cleotis Nipper               0.89 Normal           +---------+------------------+-----+---------+-------+ +-------+-----------+-----------+------------+------------+ ABI/TBIToday's ABIToday's TBIPrevious ABIPrevious TBI +-------+-----------+-----------+------------+------------+ Right                                                 +-------+-----------+-----------+------------+------------+  Summary: Right: Resting right ankle-brachial index is within normal range. No evidence of significant right lower extremity arterial disease. The right toe-brachial index is normal. Left: Resting left ankle-brachial index is within normal range. No evidence of significant left lower extremity arterial disease. The left toe-brachial index is normal.  *See table(s) above for measurements and observations.  Electronically signed by Hortencia Pilar MD on 07/11/2021 at 5:08:34 PM.    Final     PERFORMANCE STATUS (ECOG) : 2 - Symptomatic, <50% confined to bed  Review of Systems Unless otherwise noted, a complete review of systems is negative.  Physical Exam General: NAD Cardiovascular: regular rate and rhythm. Pulmonary: clear ant fields Abdomen: soft, nontender, + bowel sounds GU: no suprapubic tenderness Extremities: no edema, no joint deformities Skin: no rashes Neurological: Weakness but otherwise nonfocal  Assessment and Plan- Patient is a 75 y.o. 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  UTI -urinalysis appears consistent with UTI.  Will send for culture and sensitivities.  Patient is nontoxic-appearing.  Questionable allergy to sulfa medications.  Will start on cephalexin 500 mg every 12 hours x7 days to account for renal dosing given CKD.  Note the GFR today was markedly improved but previous GFR has trended 25-35.  Discussed triggers for ER or urgent care utilization including worsening symptoms, fever or chills.  Patient will return for infusion on Friday and then to see Dr. Janese Banks on 07/29/2021.  We can also follow-up in [facility] at any time as needed.      Patient expressed understanding and was in agreement with this plan. She also understands that She can call clinic at any time with any questions, concerns, or complaints.   Thank you for allowing me to participate in the care of this very pleasant patient.   Time Total: 15 minutes  Visit consisted of counseling and education dealing with the complex and emotionally intense issues of symptom management and palliative care in the setting of serious and potentially life-threatening illness.Greater than 50%  of this time was spent counseling and coordinating care related to the above assessment and plan.  Signed by: Altha Harm, PhD, NP-C

## 2021-07-20 NOTE — Telephone Encounter (Signed)
Pt scheduled to see Josh Borders today. Informed of appointment time.

## 2021-07-20 NOTE — Telephone Encounter (Signed)
Can one of you please see her?

## 2021-07-22 ENCOUNTER — Inpatient Hospital Stay: Payer: Medicare Other

## 2021-07-22 ENCOUNTER — Other Ambulatory Visit: Payer: Self-pay | Admitting: *Deleted

## 2021-07-22 ENCOUNTER — Other Ambulatory Visit: Payer: Self-pay

## 2021-07-22 VITALS — BP 134/84 | HR 79 | Temp 99.1°F | Resp 18

## 2021-07-22 DIAGNOSIS — Z5112 Encounter for antineoplastic immunotherapy: Secondary | ICD-10-CM | POA: Diagnosis not present

## 2021-07-22 DIAGNOSIS — Z95828 Presence of other vascular implants and grafts: Secondary | ICD-10-CM

## 2021-07-22 DIAGNOSIS — C50919 Malignant neoplasm of unspecified site of unspecified female breast: Secondary | ICD-10-CM

## 2021-07-22 LAB — CBC WITH DIFFERENTIAL/PLATELET
Abs Immature Granulocytes: 0.04 10*3/uL (ref 0.00–0.07)
Basophils Absolute: 0 10*3/uL (ref 0.0–0.1)
Basophils Relative: 0 %
Eosinophils Absolute: 0.1 10*3/uL (ref 0.0–0.5)
Eosinophils Relative: 3 %
HCT: 29.8 % — ABNORMAL LOW (ref 36.0–46.0)
Hemoglobin: 9.1 g/dL — ABNORMAL LOW (ref 12.0–15.0)
Immature Granulocytes: 1 %
Lymphocytes Relative: 15 %
Lymphs Abs: 0.7 10*3/uL (ref 0.7–4.0)
MCH: 28.7 pg (ref 26.0–34.0)
MCHC: 30.5 g/dL (ref 30.0–36.0)
MCV: 94 fL (ref 80.0–100.0)
Monocytes Absolute: 0.3 10*3/uL (ref 0.1–1.0)
Monocytes Relative: 6 %
Neutro Abs: 3.5 10*3/uL (ref 1.7–7.7)
Neutrophils Relative %: 75 %
Platelets: 199 10*3/uL (ref 150–400)
RBC: 3.17 MIL/uL — ABNORMAL LOW (ref 3.87–5.11)
RDW: 16.7 % — ABNORMAL HIGH (ref 11.5–15.5)
WBC: 4.7 10*3/uL (ref 4.0–10.5)
nRBC: 0 % (ref 0.0–0.2)

## 2021-07-22 LAB — COMPREHENSIVE METABOLIC PANEL
ALT: 12 U/L (ref 0–44)
AST: 20 U/L (ref 15–41)
Albumin: 3.5 g/dL (ref 3.5–5.0)
Alkaline Phosphatase: 61 U/L (ref 38–126)
Anion gap: 7 (ref 5–15)
BUN: 18 mg/dL (ref 8–23)
CO2: 26 mmol/L (ref 22–32)
Calcium: 8.7 mg/dL — ABNORMAL LOW (ref 8.9–10.3)
Chloride: 103 mmol/L (ref 98–111)
Creatinine, Ser: 1.18 mg/dL — ABNORMAL HIGH (ref 0.44–1.00)
GFR, Estimated: 48 mL/min — ABNORMAL LOW (ref >60)
Glucose, Bld: 115 mg/dL — ABNORMAL HIGH (ref 70–99)
Potassium: 3.6 mmol/L (ref 3.5–5.1)
Sodium: 136 mmol/L (ref 135–145)
Total Bilirubin: 0.4 mg/dL (ref 0.3–1.2)
Total Protein: 6.4 g/dL — ABNORMAL LOW (ref 6.5–8.1)

## 2021-07-22 LAB — URINE CULTURE

## 2021-07-22 MED ORDER — HEPARIN SOD (PORK) LOCK FLUSH 100 UNIT/ML IV SOLN
500.0000 [IU] | Freq: Once | INTRAVENOUS | Status: DC | PRN
  Filled 2021-07-22: qty 5

## 2021-07-22 MED ORDER — SODIUM CHLORIDE 0.9 % IV SOLN
10.0000 mg | Freq: Once | INTRAVENOUS | Status: AC
  Administered 2021-07-22: 10 mg via INTRAVENOUS
  Filled 2021-07-22: qty 10

## 2021-07-22 MED ORDER — HEPARIN SOD (PORK) LOCK FLUSH 100 UNIT/ML IV SOLN
INTRAVENOUS | Status: AC
  Filled 2021-07-22: qty 5

## 2021-07-22 MED ORDER — SODIUM CHLORIDE 0.9 % IV SOLN
Freq: Once | INTRAVENOUS | Status: AC
  Filled 2021-07-22: qty 250

## 2021-07-22 MED ORDER — HEPARIN SOD (PORK) LOCK FLUSH 100 UNIT/ML IV SOLN
500.0000 [IU] | Freq: Once | INTRAVENOUS | Status: AC
  Administered 2021-07-22: 500 [IU] via INTRAVENOUS
  Filled 2021-07-22: qty 5

## 2021-07-22 MED ORDER — FAMOTIDINE 20 MG IN NS 100 ML IVPB
20.0000 mg | Freq: Once | INTRAVENOUS | Status: AC
  Administered 2021-07-22: 20 mg via INTRAVENOUS
  Filled 2021-07-22: qty 20

## 2021-07-22 MED ORDER — DIPHENHYDRAMINE HCL 50 MG/ML IJ SOLN
50.0000 mg | Freq: Once | INTRAMUSCULAR | Status: AC
  Administered 2021-07-22: 50 mg via INTRAVENOUS
  Filled 2021-07-22: qty 1

## 2021-07-22 MED ORDER — SODIUM CHLORIDE 0.9 % IV SOLN
65.0000 mg/m2 | Freq: Once | INTRAVENOUS | Status: AC
  Administered 2021-07-22: 114 mg via INTRAVENOUS
  Filled 2021-07-22: qty 19

## 2021-07-22 MED ORDER — SODIUM CHLORIDE 0.9% FLUSH
10.0000 mL | Freq: Once | INTRAVENOUS | Status: AC
  Administered 2021-07-22: 10 mL via INTRAVENOUS
  Filled 2021-07-22: qty 10

## 2021-07-22 NOTE — Progress Notes (Signed)
Left port with no blood return.

## 2021-07-22 NOTE — Patient Instructions (Signed)
[facility]ENTER [place] [facility]  Discharge Instructions: Thank you for choosing [facility] to provide your oncology and hematology care.  If you have a lab appointment with the Cancer Center, please go directly to the Cancer Center and check in at the registration area.  Wear comfortable clothing and clothing appropriate for easy access to any Portacath or PICC line.   We strive to give you quality time with your provider. You may need to reschedule your appointment if you arrive late (15 or more minutes).  Arriving late affects you and other patients whose appointments are after yours.  Also, if you miss three or more appointments without notifying the office, you may be dismissed from the clinic at the provider's discretion.      For prescription refill requests, have your pharmacy contact our office and allow 72 hours for refills to be completed.    Today you received the following chemotherapy and/or immunotherapy agents: Taxol      To help prevent nausea and vomiting after your treatment, we encourage you to take your nausea medication as directed.  BELOW ARE SYMPTOMS THAT SHOULD BE REPORTED IMMEDIATELY: *FEVER GREATER THAN 100.4 F (38 C) OR HIGHER *CHILLS OR SWEATING *NAUSEA AND VOMITING THAT IS NOT CONTROLLED WITH YOUR NAUSEA MEDICATION *UNUSUAL SHORTNESS OF BREATH *UNUSUAL BRUISING OR BLEEDING *URINARY PROBLEMS (pain or burning when urinating, or frequent urination) *BOWEL PROBLEMS (unusual diarrhea, constipation, pain near the anus) TENDERNESS IN MOUTH AND THROAT WITH OR WITHOUT PRESENCE OF ULCERS (sore throat, sores in mouth, or a toothache) UNUSUAL RASH, SWELLING OR PAIN  UNUSUAL VAGINAL DISCHARGE OR ITCHING   Items with * indicate a potential emergency and should be followed up as soon as possible or go to the Emergency Department if any problems should occur.  Please show the CHEMOTHERAPY ALERT CARD or IMMUNOTHERAPY ALERT CARD at check-in to  the Emergency Department and triage nurse.  Should you have questions after your visit or need to cancel or reschedule your appointment, please contact [facility]CER CENTER [place] [facility]  [phone] and follow the prompts.  Office hours are 8:00 a.m. to 4:30 p.m. Monday - Friday. Please note that voicemails left after 4:00 p.m. may not be returned until the following business day.  We are closed weekends and major holidays. You have access to a nurse at all times for urgent questions. Please call the main number to the clinic [phone] and follow the prompts.  For any non-urgent questions, you may also contact your provider using MyChart. We now offer e-Visits for anyone 18 and older to request care online for non-urgent symptoms. For details visit [email or web address].   Also download the MyChart app! Go to the app store, search "MyChart", open the app, select , and log in with your MyChart username and password.  Due to Covid, a mask is required upon entering the hospital/clinic. If you do not have a mask, one will be given to you upon arrival. For doctor visits, patients may have 1 support person aged 18 or older with them. For treatment visits, patients cannot have anyone with them due to current Covid guidelines and our immunocompromised population. Paclitaxel injection What is this medication? PACLITAXEL (PAK li TAX el) is a chemotherapy drug. It targets fast dividing cells, like cancer cells, and causes these cells to die. This medicine is used to treat ovarian cancer, breast cancer, lung cancer, Kaposi's sarcoma, andother cancers. This medicine may be used for other purposes; ask your health care   care provider orpharmacist if you have questions. COMMON BRAND NAME(S): Onxol, Taxol What should I tell my care team before I take this medication? They need to know if you have any of these conditions: history of irregular heartbeat liver disease low blood counts, like  low white cell, platelet, or red cell counts lung or breathing disease, like asthma tingling of the fingers or toes, or other nerve disorder an unusual or allergic reaction to paclitaxel, alcohol, polyoxyethylated castor oil, other chemotherapy, other medicines, foods, dyes, or preservatives pregnant or trying to get pregnant breast-feeding How should I use this medication? This drug is given as an infusion into a vein. It is administered in a hospitalor clinic by a specially trained health care professional. Talk to your pediatrician regarding the use of this medicine in children.Special care may be needed. Overdosage: If you think you have taken too much of this medicine contact apoison control center or emergency room at once. NOTE: This medicine is only for you. Do not share this medicine with others. What if I miss a dose? It is important not to miss your dose. Call your doctor or health careprofessional if you are unable to keep an appointment. What may interact with this medication? Do not take this medicine with any of the following medications: live virus vaccines This medicine may also interact with the following medications: antiviral medicines for hepatitis, HIV or AIDS certain antibiotics like erythromycin and clarithromycin certain medicines for fungal infections like ketoconazole and itraconazole certain medicines for seizures like carbamazepine, phenobarbital, phenytoin gemfibrozil nefazodone rifampin [place]. [place]'s wort This list may not describe all possible interactions. Give your health care provider a list of all the medicines, herbs, non-prescription drugs, or dietary supplements you use. Also tell them if you smoke, drink alcohol, or use illegaldrugs. Some items may interact with your medicine. What should I watch for while using this medication? Your condition will be monitored carefully while you are receiving this medicine. You will need important blood work done  while you are taking thismedicine. This medicine can cause serious allergic reactions. To reduce your risk you will need to take other medicine(s) before treatment with this medicine. If you experience allergic reactions like skin rash, itching or hives, swelling of theface, lips, or tongue, tell your doctor or health care professional right away. In some cases, you may be given additional medicines to help with side effects.Follow all directions for their use. This drug may make you feel generally unwell. This is not uncommon, as chemotherapy can affect healthy cells as well as cancer cells. Report any side effects. Continue your course of treatment even though you feel ill unless yourdoctor tells you to stop. Call your doctor or health care professional for advice if you get a fever, chills or sore throat, or other symptoms of a cold or flu. Do not treat yourself. This drug decreases your body's ability to fight infections. Try toavoid being around people who are sick. This medicine may increase your risk to bruise or bleed. Call your doctor orhealth care professional if you notice any unusual bleeding. Be careful brushing and flossing your teeth or using a toothpick because you may get an infection or bleed more easily. If you have any dental work done,tell your dentist you are receiving this medicine. Avoid taking products that contain aspirin, acetaminophen, ibuprofen, naproxen, or ketoprofen unless instructed by your doctor. These medicines may hide afever. Do not become pregnant while taking this medicine. Women should inform their doctor if they  wish to become pregnant or think they might be pregnant. There is a potential for serious side effects to an unborn child. Talk to your health care professional or pharmacist for more information. Do not breast-feed aninfant while taking this medicine. Men are advised not to father a child while receiving this medicine. This product may contain alcohol. Ask  your pharmacist or healthcare provider if this medicine contains alcohol. Be sure to tell all healthcare providers you are taking this medicine. Certain medicines, like metronidazole and disulfiram, can cause an unpleasant reaction when taken with alcohol. The reaction includes flushing, headache, nausea, vomiting, sweating, and increased thirst. Thereaction can last from 30 minutes to several hours. What side effects may I notice from receiving this medication? Side effects that you should report to your doctor or health care professionalas soon as possible: allergic reactions like skin rash, itching or hives, swelling of the face, lips, or tongue breathing problems changes in vision fast, irregular heartbeat high or low blood pressure mouth sores pain, tingling, numbness in the hands or feet signs of decreased platelets or bleeding - bruising, pinpoint red spots on the skin, black, tarry stools, blood in the urine signs of decreased red blood cells - unusually weak or tired, feeling faint or lightheaded, falls signs of infection - fever or chills, cough, sore throat, pain or difficulty passing urine signs and symptoms of liver injury like dark yellow or brown urine; general ill feeling or flu-like symptoms; light-colored stools; loss of appetite; nausea; right upper belly pain; unusually weak or tired; yellowing of the eyes or skin swelling of the ankles, feet, hands unusually slow heartbeat Side effects that usually do not require medical attention (report to yourdoctor or health care professional if they continue or are bothersome): diarrhea hair loss loss of appetite muscle or joint pain nausea, vomiting pain, redness, or irritation at site where injected tiredness This list may not describe all possible side effects. Call your doctor for medical advice about side effects. You may report side effects to FDA at1-800-FDA-1088. Where should I keep my medication? This drug is given in a  hospital or clinic and will not be stored at home. NOTE: This sheet is a summary. It may not cover all possible information. If you have questions about this medicine, talk to your doctor, pharmacist, orhealth care provider.  2022 Elsevier/Gold Standard (2019-10-29 13:37:23)

## 2021-07-25 ENCOUNTER — Other Ambulatory Visit: Payer: Self-pay

## 2021-07-26 ENCOUNTER — Telehealth: Payer: Self-pay | Admitting: *Deleted

## 2021-07-26 NOTE — Telephone Encounter (Signed)
Called daughter and asked if she can bring pt to the medical mall tomorrow after she has the wound care appt. She states that she is not sure if the have enough time. I told her that the appt 9:15 to 9:45 and pt does not need to be at the medical mall at 10:45. So that it is 1 hour in between coming from wound care and medical mall. Her daughter [name] is ok with this and she will come to medical mall and check in admissions and registration. Then she will get a bracelet and be taken to radiology and they will do the dye study and it usually lasts about 30 min. . She has all instructions

## 2021-07-27 ENCOUNTER — Ambulatory Visit
Admission: RE | Admit: 2021-07-27 | Discharge: 2021-07-27 | Disposition: A | Discharge status: Home / Self Care | Payer: Medicare Other | Source: Ambulatory Visit | Attending: Oncology | Admitting: Oncology

## 2021-07-27 ENCOUNTER — Ambulatory Visit: Payer: Medicare Other

## 2021-07-27 ENCOUNTER — Encounter (HOSPITAL_BASED_OUTPATIENT_CLINIC_OR_DEPARTMENT_OTHER): Payer: Medicare Other | Admitting: Internal Medicine

## 2021-07-27 ENCOUNTER — Other Ambulatory Visit: Payer: Self-pay

## 2021-07-27 DIAGNOSIS — S81802D Unspecified open wound, left lower leg, subsequent encounter: Secondary | ICD-10-CM | POA: Diagnosis not present

## 2021-07-27 DIAGNOSIS — Z452 Encounter for adjustment and management of vascular access device: Secondary | ICD-10-CM | POA: Insufficient documentation

## 2021-07-27 DIAGNOSIS — Z95828 Presence of other vascular implants and grafts: Secondary | ICD-10-CM

## 2021-07-27 DIAGNOSIS — S81802A Unspecified open wound, left lower leg, initial encounter: Secondary | ICD-10-CM | POA: Diagnosis not present

## 2021-07-27 HISTORY — PX: IR FLUORO GUIDE CV LINE LEFT: IMG2282

## 2021-07-27 MED ORDER — IOHEXOL 300 MG/ML  SOLN
20.0000 mL | Freq: Once | INTRAMUSCULAR | Status: AC | PRN
  Administered 2021-07-27: 20 mL
  Filled 2021-07-27: qty 20

## 2021-07-27 MED ORDER — HEPARIN SOD (PORK) LOCK FLUSH 100 UNIT/ML IV SOLN
INTRAVENOUS | Status: AC | PRN
  Administered 2021-07-27: 500 [IU] via INTRAVENOUS

## 2021-07-27 NOTE — Progress Notes (Signed)
Laura Santiago, Laura Santiago ([id number]) Visit Report for 07/27/2021 Arrival Information Details Patient Name: Laura Santiago, Laura Santiago. Date of Service: 07/27/2021 9:15 AM Medical Record Number: [id number] Patient Account Number: [id number] Date of Birth/Sex: [birth year] (74 y.o. Female) Treating RN: Dolan Amen Primary Care Stevenson Windmiller: SYSTEM, PCP Other Clinician: Referring Breshae Belcher: Randa Evens Treating Brandt Chaney/Extender: Yaakov Guthrie in Treatment: 3 Visit Information History Since Last Visit Pain Present Now: No Patient Arrived: Walker Arrival Time: 09:13 Accompanied By: self/service dog Transfer Assistance: None Patient Identification Verified: Yes Secondary Verification Process Completed: Yes Patient Has Alerts: Yes Patient Alerts: PT HAS SERVICE ANIMAL Notes Patient removed wrap at home and had the wound covered up with paper towels Electronic Signature(s) Signed: 07/27/2021 4:33:59 PM By: Dolan Amen RN Entered By: Dolan Amen on 07/27/2021 09:15:01 Laura Santiago ([id number]) -------------------------------------------------------------------------------- Clinic Level of Care Assessment Details Patient Name: Laura Santiago. Date of Service: 07/27/2021 9:15 AM Medical Record Number: [id number] Patient Account Number: [id number] Date of Birth/Sex: [birth year] (75 y.o. Female) Treating RN: Dolan Amen Primary Care Aizza Santiago: SYSTEM, PCP Other Clinician: Referring Ahaana Rochette: Randa Evens Treating Ingvald Theisen/Extender: Yaakov Guthrie in Treatment: 3 Clinic Level of Care Assessment Items TOOL 1 Quantity Score []  - Use when EandM and Procedure is performed on INITIAL visit 0 ASSESSMENTS - Nursing Assessment / Reassessment []  - General Physical Exam (combine w/ comprehensive assessment (listed just below) when performed on new 0 pt. evals) []  - 0 Comprehensive Assessment (HX, ROS, Risk Assessments, Wounds Hx, etc.) ASSESSMENTS - Wound and Skin Assessment /  Reassessment []  - Dermatologic / Skin Assessment (not related to wound area) 0 ASSESSMENTS - Ostomy and/or Continence Assessment and Care []  - Incontinence Assessment and Management 0 []  - 0 Ostomy Care Assessment and Management (repouching, etc.) PROCESS - Coordination of Care []  - Simple Patient / Family Education for ongoing care 0 []  - 0 Complex (extensive) Patient / Family Education for ongoing care []  - 0 Staff obtains Programmer, systems, Records, Test Results / Process Orders []  - 0 Staff telephones HHA, Nursing Homes / Clarify orders / etc []  - 0 Routine Transfer to another Facility (non-emergent condition) []  - 0 Routine Hospital Admission (non-emergent condition) []  - 0 New Admissions / Biomedical engineer / Ordering NPWT, Apligraf, etc. []  - 0 Emergency Hospital Admission (emergent condition) PROCESS - Special Needs []  - Pediatric / Minor Patient Management 0 []  - 0 Isolation Patient Management []  - 0 Hearing / Language / Visual special needs []  - 0 Assessment of Community assistance (transportation, D/C planning, etc.) []  - 0 Additional assistance / Altered mentation []  - 0 Support Surface(s) Assessment (bed, cushion, seat, etc.) INTERVENTIONS - Miscellaneous []  - External ear exam 0 []  - 0 Patient Transfer (multiple staff / Civil Service fast streamer / Similar devices) []  - 0 Simple Staple / Suture removal (25 or less) []  - 0 Complex Staple / Suture removal (26 or more) []  - 0 Hypo/Hyperglycemic Management (do not check if billed separately) []  - 0 Ankle / Brachial Index (ABI) - do not check if billed separately Has the patient been seen at the hospital within the last three years: Yes Total Score: 0 Level Of Care: ____ Laura Santiago ([id number]) Electronic Signature(s) Signed: 07/27/2021 4:33:59 PM By: Dolan Amen RN Entered By: Dolan Amen on 07/27/2021 09:42:31 Laura Santiago  ([id number]) -------------------------------------------------------------------------------- Compression Therapy Details Patient Name: Laura Santiago. Date of Service: 07/27/2021 9:15 AM Medical Record Number: [id number] Patient Account Number: [id number] Date of Birth/Sex: 1946-08-04 (75 y.o. Female)  Treating RN: Dolan Amen Primary Care Cable Fearn: SYSTEM, PCP Other Clinician: Referring Leisa Gault: Randa Evens Treating Arius Harnois/Extender: Yaakov Guthrie in Treatment: 3 Compression Therapy Performed for Wound Assessment: Wound #1 Left,Medial Lower Leg Performed By: Clinician Dolan Amen, RN Compression Type: Three Layer Pre Treatment ABI: 1.5 Post Procedure Diagnosis Same as Pre-procedure Electronic Signature(s) Signed: 07/27/2021 4:33:59 PM By: Dolan Amen RN Entered By: Dolan Amen on 07/27/2021 09:35:59 Laura Santiago, Laura Santiago ([id number]) -------------------------------------------------------------------------------- Encounter Discharge Information Details Patient Name: Laura Santiago, Laura Santiago. Date of Service: 07/27/2021 9:15 AM Medical Record Number: [id number] Patient Account Number: [id number] Date of Birth/Sex: [birth year] (75 y.o. Female) Treating RN: Dolan Amen Primary Care Jazmin Vensel: SYSTEM, PCP Other Clinician: Referring Shambhavi Salley: Randa Evens Treating Bianey Tesoro/Extender: Yaakov Guthrie in Treatment: 3 Encounter Discharge Information Items Post Procedure Vitals Discharge Condition: Stable Temperature (F): 98.5 Ambulatory Status: Walker Pulse (bpm): 87 Discharge Destination: Home Respiratory Rate (breaths/min): 18 Transportation: Private Auto Blood Pressure (mmHg): 120/64 Accompanied By: self Schedule Follow-up Appointment: Yes Clinical Summary of Care: Electronic Signature(s) Signed: 07/27/2021 4:33:59 PM By: Dolan Amen RN Entered By: Dolan Amen on 07/27/2021 09:59:56 Laura Santiago  ([id number]) -------------------------------------------------------------------------------- Lower Extremity Assessment Details Patient Name: Laura Santiago. Date of Service: 07/27/2021 9:15 AM Medical Record Number: [id number] Patient Account Number: [id number] Date of Birth/Sex: August 21, 1946 (75 y.o. Female) Treating RN: Dolan Amen Primary Care Aaditya Letizia: SYSTEM, PCP Other Clinician: Referring Zanyah Lentsch: Randa Evens Treating Akeylah Hendel/Extender: Yaakov Guthrie in Treatment: 3 Edema Assessment Assessed: [Left: Yes] [Right: No] Edema: [Left: Ye] [Right: s] Calf Left: Right: Point of Measurement: 29 cm From Medial Instep 41 cm Ankle Left: Right: Point of Measurement: 10 cm From Medial Instep 22.5 cm Knee To Floor Left: Right: From Medial Instep 39 cm Vascular Assessment Pulses: Dorsalis Pedis Palpable: [Left:Yes] Electronic Signature(s) Signed: 07/27/2021 4:33:59 PM By: Dolan Amen RN Entered By: Dolan Amen on 07/27/2021 09:39:43 Laura Santiago ([id number]) -------------------------------------------------------------------------------- Multi Wound Chart Details Patient Name: Laura Santiago. Date of Service: 07/27/2021 9:15 AM Medical Record Number: [id number] Patient Account Number: [id number] Date of Birth/Sex: [birth year] (75 y.o. Female) Treating RN: Dolan Amen Primary Care Jaedon Siler: SYSTEM, PCP Other Clinician: Referring Asani Mcburney: Randa Evens Treating Tasfia Vasseur/Extender: Yaakov Guthrie in Treatment: 3 Vital Signs Height(in): 56 Pulse(bpm): 54 Weight(lbs): 153 Blood Pressure(mmHg): 120/64 Body Mass Index(BMI): 25 Temperature(F): 98.5 Respiratory Rate(breaths/min): 18 Photos: [N/A:N/A] Wound Location: Left, Medial Lower Leg N/A N/A Wounding Event: Gradually Appeared N/A N/A Primary Etiology: Venous Leg Ulcer N/A N/A Comorbid History: Hypertension, Osteoarthritis, N/A N/A Received Chemotherapy, Received Radiation Date Acquired:  04/06/2021 N/A N/A Weeks of Treatment: 3 N/A N/A Wound Status: Open N/A N/A Clustered Wound: Yes N/A N/A Clustered Quantity: 4 N/A N/A Measurements L x W x D (cm) 8.5x9.5x0.2 N/A N/A Area (cm) : 63.421 N/A N/A Volume (cm) : 12.684 N/A N/A % Reduction in Area: 29.80% N/A N/A % Reduction in Volume: 29.80% N/A N/A Classification: Full Thickness Without Exposed N/A N/A Support Structures Exudate Amount: Large N/A N/A Exudate Type: Serosanguineous N/A N/A Exudate Color: red, brown N/A N/A Granulation Amount: Large (67-100%) N/A N/A Granulation Quality: Red N/A N/A Necrotic Amount: Small (1-33%) N/A N/A Exposed Structures: Fat Layer (Subcutaneous Tissue): N/A N/A Yes Fascia: No Tendon: No Muscle: No Joint: No Bone: No Epithelialization: Large (67-100%) N/A N/A Debridement: Debridement - Excisional N/A N/A Pre-procedure Verification/Time 09:40 N/A N/A Out Taken: Tissue Debrided: Subcutaneous, Slough N/A N/A Level: Skin/Subcutaneous Tissue N/A N/A Debridement Area (sq cm): 80.75 N/A N/A Instrument: Curette  N/A N/A Bleeding: Minimum N/A N/A Hemostasis Achieved: Pressure N/A N/A Debridement Treatment Procedure was tolerated well N/A N/A ResponseCHERIE, LASALLE ([id number]) Post Debridement 8.5x9.5x0.3 N/A N/A Measurements L x W x D (cm) Post Debridement Volume: 19.026 N/A N/A (cm) Procedures Performed: Compression Therapy N/A N/A Debridement Treatment Notes Electronic Signature(s) Signed: 07/27/2021 10:04:47 AM By: Kalman Shan DO Entered By: Kalman Shan on 07/27/2021 09:48:48 Laura Santiago ([id number]) -------------------------------------------------------------------------------- Multi-Disciplinary Care Plan Details Patient Name: Laura Santiago. Date of Service: 07/27/2021 9:15 AM Medical Record Number: [id number] Patient Account Number: [id number] Date of Birth/Sex: 1946/11/09 (75 y.o. Female) Treating RN: Dolan Amen Primary Care Teniyah Seivert:  SYSTEM, PCP Other Clinician: Referring Jayvien Rowlette: Randa Evens Treating Lakeyia Surber/Extender: Yaakov Guthrie in Treatment: 3 Active Inactive Necrotic Tissue Nursing Diagnoses: Impaired tissue integrity related to necrotic/devitalized tissue Knowledge deficit related to management of necrotic/devitalized tissue Goals: Necrotic/devitalized tissue will be minimized in the wound bed Date Initiated: 07/06/2021 Date Inactivated: 07/27/2021 Target Resolution Date: 07/06/2021 Goal Status: Met Patient/caregiver will verbalize understanding of reason and process for debridement of necrotic tissue Date Initiated: 07/06/2021 Target Resolution Date: 07/06/2021 Goal Status: Active Interventions: Assess patient pain level pre-, during and post procedure and prior to discharge Provide education on necrotic tissue and debridement process Treatment Activities: Apply topical anesthetic as ordered : 07/06/2021 Biologic debridement : 07/06/2021 Enzymatic debridement : 07/06/2021 Excisional debridement : 07/06/2021 Notes: Wound/Skin Impairment Nursing Diagnoses: Impaired tissue integrity Goals: Patient/caregiver will verbalize understanding of skin care regimen Date Initiated: 07/06/2021 Date Inactivated: 07/27/2021 Target Resolution Date: 07/06/2021 Goal Status: Met Ulcer/skin breakdown will have a volume reduction of 30% by week 4 Date Initiated: 07/06/2021 Target Resolution Date: 08/06/2021 Goal Status: Active Ulcer/skin breakdown will have a volume reduction of 50% by week 8 Date Initiated: 07/06/2021 Target Resolution Date: 09/06/2021 Goal Status: Active Ulcer/skin breakdown will have a volume reduction of 80% by week 12 Date Initiated: 07/06/2021 Target Resolution Date: 10/06/2021 Goal Status: Active Ulcer/skin breakdown will heal within 14 weeks Date Initiated: 07/06/2021 Target Resolution Date: 11/06/2021 Goal Status: Active Interventions: Assess patient/caregiver ability to obtain  necessary supplies Assess patient/caregiver ability to perform ulcer/skin care regimen upon admission and as needed Assess ulceration(s) every visit Laura Santiago, Laura Santiago ([id number]) Treatment Activities: Referred to DME Albin Duckett for dressing supplies : 07/06/2021 Skin care regimen initiated : 07/06/2021 Notes: Electronic Signature(s) Signed: 07/27/2021 4:33:59 PM By: Dolan Amen RN Entered By: Dolan Amen on 07/27/2021 09:31:30 Laura Santiago ([id number]) -------------------------------------------------------------------------------- Pain Assessment Details Patient Name: Laura Santiago. Date of Service: 07/27/2021 9:15 AM Medical Record Number: [id number] Patient Account Number: [id number] Date of Birth/Sex: [birth year] (75 y.o. Female) Treating RN: Dolan Amen Primary Care Marris Frontera: SYSTEM, PCP Other Clinician: Referring Zyden Suman: Randa Evens Treating Ashtin Melichar/Extender: Yaakov Guthrie in Treatment: 3 Active Problems Location of Pain Severity and Description of Pain Patient Has Paino No Site Locations Rate the pain. Current Pain Level: 0 Pain Management and Medication Current Pain Management: Electronic Signature(s) Signed: 07/27/2021 4:33:59 PM By: Dolan Amen RN Entered By: Dolan Amen on 07/27/2021 09:17:54 Laura Santiago ([id number]) -------------------------------------------------------------------------------- Patient/Caregiver Education Details Patient Name: Laura Santiago. Date of Service: 07/27/2021 9:15 AM Medical Record Number: [id number] Patient Account Number: [id number] Date of Birth/Gender: [birth year] (75 y.o. Female) Treating RN: Dolan Amen Primary Care Physician: SYSTEM, PCP Other Clinician: Referring Physician: Randa Evens Treating Physician/Extender: Yaakov Guthrie in Treatment: 3 Education Assessment Education Provided To: Patient Education Topics Provided Wound/Skin Impairment: Methods:  Explain/Verbal Responses: State content correctly Electronic  Signature(s) Signed: 07/27/2021 4:33:59 PM By: Dolan Amen RN Entered By: Dolan Amen on 07/27/2021 09:59:11 Laura Santiago ([id number]) -------------------------------------------------------------------------------- Wound Assessment Details Patient Name: Laura Santiago, Laura Santiago. Date of Service: 07/27/2021 9:15 AM Medical Record Number: [id number] Patient Account Number: [id number] Date of Birth/Sex: Feb 08, 1946 (75 y.o. Female) Treating RN: Dolan Amen Primary Care Nasean Zapf: SYSTEM, PCP Other Clinician: Referring Gedeon Brandow: Randa Evens Treating Micheline Markes/Extender: Yaakov Guthrie in Treatment: 3 Wound Status Wound Number: 1 Primary Venous Leg Ulcer Etiology: Wound Location: Left, Medial Lower Leg Wound Status: Open Wounding Event: Gradually Appeared Comorbid Hypertension, Osteoarthritis, Received Chemotherapy, Date Acquired: 04/06/2021 History: Received Radiation Weeks Of Treatment: 3 Clustered Wound: Yes Photos Wound Measurements Length: (cm) 8.5 Width: (cm) 9.5 Depth: (cm) 0.2 Clustered Quantity: 4 Area: (cm) 63.421 Volume: (cm) 12.684 % Reduction in Area: 29.8% % Reduction in Volume: 29.8% Epithelialization: Large (67-100%) Tunneling: No Undermining: No Wound Description Classification: Full Thickness Without Exposed Support Structu Exudate Amount: Large Exudate Type: Serosanguineous Exudate Color: red, brown res Foul Odor After Cleansing: No Slough/Fibrino Yes Wound Bed Granulation Amount: Large (67-100%) Exposed Structure Granulation Quality: Red Fascia Exposed: No Necrotic Amount: Small (1-33%) Fat Layer (Subcutaneous Tissue) Exposed: Yes Necrotic Quality: Adherent Slough Tendon Exposed: No Muscle Exposed: No Joint Exposed: No Bone Exposed: No Treatment Notes Wound #1 (Lower Leg) Wound Laterality: Left, Medial Cleanser Soap and Water Discharge Instruction: Gently cleanse  wound with antibacterial soap, rinse and pat dry prior to dressing wounds Peri-Wound Care Laura Santiago, Laura Santiago ([id number]) Topical Gentamicin Discharge Instruction: Apply as directed by Nataley Bahri. Santyl Collagenase Ointment, 30 (gm), tube Discharge Instruction: Apply nickel thick to wound bed only Primary Dressing Hydrofera Blue Ready Transfer Foam, 2.5x2.5 (in/in) Discharge Instruction: Apply Hydrofera Blue Ready to wound bed as directed Secondary Dressing ABD Pad 5x9 (in/in) Discharge Instruction: Cover with ABD pad Secured With Compression Wrap Profore Lite LF 3 Multilayer Compression [place] Discharge Instruction: Apply 3 multi-layer wrap as prescribed. Compression Stockings Circaid Juxta Lite Compression Wrap Quantity: 1 Left Leg Compression Amount: 20-30 mmHg Discharge Instruction: Apply Circaid Juxta Lite Compression Wrap as directed Add-Ons Electronic Signature(s) Signed: 07/27/2021 4:33:59 PM By: Dolan Amen RN Entered By: Dolan Amen on 07/27/2021 09:20:02 Laura Santiago ([id number]) -------------------------------------------------------------------------------- Vitals Details Patient Name: Laura Santiago. Date of Service: 07/27/2021 9:15 AM Medical Record Number: [id number] Patient Account Number: [id number] Date of Birth/Sex: [birth year] (75 y.o. Female) Treating RN: Dolan Amen Primary Care Navy Rothschild: SYSTEM, PCP Other Clinician: Referring Jordin Vicencio: Randa Evens Treating Carron Mcmurry/Extender: Yaakov Guthrie in Treatment: 3 Vital Signs Time Taken: 09:15 Temperature (F): 98.5 Height (in): 66 Pulse (bpm): 87 Weight (lbs): 153 Respiratory Rate (breaths/min): 18 Body Mass Index (BMI): 24.7 Blood Pressure (mmHg): 120/64 Reference Range: 80 - 120 mg / dl Electronic Signature(s) Signed: 07/27/2021 4:33:59 PM By: Dolan Amen RN Entered By: Dolan Amen on 07/27/2021 09:17:30

## 2021-07-27 NOTE — Progress Notes (Signed)
Laura Santiago ([id number]) Visit Report for 07/27/2021 Chief Complaint Document Details Patient Name: Laura Santiago, Laura Santiago. Date of Service: 07/27/2021 9:15 AM Medical Record Number: [id number] Patient Account Number: [id number] Date of Birth/Sex: [birth year] (75 y.o. Female) Treating RN: Laura Santiago Primary Care Provider: SYSTEM, PCP Other Clinician: Referring Provider: Randa Santiago Treating Provider/Extender: Laura Santiago in Treatment: 3 Information Obtained from: Patient Chief Complaint Left lower extremity wound Electronic Signature(s) Signed: 07/27/2021 10:04:47 AM By: Laura Shan DO Entered By: Laura Santiago on 07/27/2021 09:48:59 Laura Santiago ([id number]) -------------------------------------------------------------------------------- Debridement Details Patient Name: Laura Santiago. Date of Service: 07/27/2021 9:15 AM Medical Record Number: [id number] Patient Account Number: [id number] Date of Birth/Sex: 1946-06-25 (75 y.o. Female) Treating RN: Laura Santiago Primary Care Provider: SYSTEM, PCP Other Clinician: Referring Provider: Randa Santiago Treating Provider/Extender: Laura Santiago in Treatment: 3 Debridement Performed for Wound #1 Left,Medial Lower Leg Assessment: Performed By: Physician Laura Shan, MD Debridement Type: Debridement Severity of Tissue Pre Debridement: Fat layer exposed Level of Consciousness (Pre- Awake and Alert procedure): Pre-procedure Verification/Time Out Yes - 09:40 Taken: Start Time: 09:40 Total Area Debrided (L x W): 8.5 (cm) x 9.5 (cm) = 80.75 (cm) Tissue and other material Viable, Non-Viable, Slough, Subcutaneous, Slough debrided: Level: Skin/Subcutaneous Tissue Debridement Description: Excisional Instrument: Curette Bleeding: Minimum Hemostasis Achieved: Pressure Response to Treatment: Procedure was tolerated well Level of Consciousness (Post- Awake and Alert procedure): Post Debridement  Measurements of Total Wound Length: (cm) 8.5 Width: (cm) 9.5 Depth: (cm) 0.3 Volume: (cm) 19.026 Character of Wound/Ulcer Post Debridement: Stable Severity of Tissue Post Debridement: Fat layer exposed Post Procedure Diagnosis Same as Pre-procedure Electronic Signature(s) Signed: 07/27/2021 10:04:47 AM By: Laura Shan DO Signed: 07/27/2021 4:33:59 PM By: Laura Amen RN Entered By: Laura Santiago on 07/27/2021 09:41:23 Laura Santiago ([id number]) -------------------------------------------------------------------------------- HPI Details Patient Name: Laura Santiago. Date of Service: 07/27/2021 9:15 AM Medical Record Number: [id number] Patient Account Number: [id number] Date of Birth/Sex: 1946-03-16 (75 y.o. Female) Treating RN: Laura Santiago Primary Care Provider: SYSTEM, PCP Other Clinician: Referring Provider: Randa Santiago Treating Provider/Extender: Laura Santiago in Treatment: 3 History of Present Illness HPI Description: Admission 7/27 Ms. Laura Santiago is a 75 year old female with a past medical history of ADHD, metastatic breast cancer, stage IV chronic kidney disease, history of DVT on Xarelto and chronic venous insufficiency that presents to the clinic for a chronic left lower extremity wound. She recently moved to [facility] 4 days ago. She was being followed by wound care center in [name]. She reports a 10-year history of wounds to her left lower extremity that eventually do heal with debridement and compression therapy. She states that the current wound reopened 4 months ago and she is using Vaseline and Coban. She denies signs of infection. 8/3; patient presents for 1 week follow-up. She reports no issues or complaints today. She states she had vascular studies done in the last week. She denies signs of infection. She brought her little service dog with her today. 8/17; patient presents for follow-up. She has missed her last clinic  appointment. She states she took the wrap off and attempted to rewrap her leg. She is having difficulty with transportation. She has her service dog with her today. Overall she feels well and reports improvement in wound healing. She denies signs of infection. She reports owning an old Velcro wrap compression and has this at her living facility Electronic Signature(s) Signed: 07/27/2021 10:04:47 AM By: Laura Shan DO Entered By: Laura Santiago on  07/27/2021 09:51:28 Laura Santiago, Laura Santiago ([id number]) -------------------------------------------------------------------------------- Physical Exam Details Patient Name: Laura Santiago, Laura Santiago. Date of Service: 07/27/2021 9:15 AM Medical Record Number: [id number] Patient Account Number: [id number] Date of Birth/Sex: 1946-10-03 (75 y.o. Female) Treating RN: Laura Santiago Primary Care Provider: SYSTEM, PCP Other Clinician: Referring Provider: Randa Santiago Treating Provider/Extender: Laura Santiago in Treatment: 3 Constitutional . Cardiovascular . Psychiatric . Notes Left lower extremity: Open wounds to the medial aspect with slough and fibrinous tissue. More granulation tissue present compared to last clinic visit. No obvious signs of infection. Electronic Signature(s) Signed: 07/27/2021 10:04:47 AM By: Laura Shan DO Entered By: Laura Santiago on 07/27/2021 09:52:07 Laura Santiago ([id number]) -------------------------------------------------------------------------------- Physician Orders Details Patient Name: Laura Santiago. Date of Service: 07/27/2021 9:15 AM Medical Record Number: [id number] Patient Account Number: [id number] Date of Birth/Sex: 1946-03-08 (75 y.o. Female) Treating RN: Laura Santiago Primary Care Provider: SYSTEM, PCP Other Clinician: Referring Provider: Randa Santiago Treating Provider/Extender: Laura Santiago in Treatment: 3 Verbal / Phone Orders: No Diagnosis Coding Follow-up  Appointments o Return Appointment in 1 week. Home Health o ADMIT to Home Health for wound care. May utilize formulary equivalent dressing for wound treatment orders unless otherwise specified. Home Health Nurse may visit PRN to address patientos wound care needs. - [facility] Bathing/ Shower/ Hygiene o May shower with wound dressing protected with water repellent cover or cast protector. o No tub bath. Wound Treatment Wound #1 - Lower Leg Wound Laterality: Left, Medial Cleanser: Soap and Water 2 x Per Week/30 Days Discharge Instructions: Gently cleanse wound with antibacterial soap, rinse and pat dry prior to dressing wounds Topical: Gentamicin 2 x Per Week/30 Days Discharge Instructions: Apply as directed by provider. Topical: Santyl Collagenase Ointment, 30 (gm), tube 2 x Per Week/30 Days Discharge Instructions: Apply nickel thick to wound bed only Primary Dressing: Hydrofera Blue Ready Transfer Foam, 2.5x2.5 (in/in) (DME) (Generic) 2 x Per Week/30 Days Discharge Instructions: Apply Hydrofera Blue Ready to wound bed as directed Secondary Dressing: ABD Pad 5x9 (in/in) (DME) (Generic) 2 x Per Week/30 Days Discharge Instructions: Cover with ABD pad Compression Wrap: Profore Lite LF 3 Multilayer Compression Bandaging Laura Santiago 2 x Per Week/30 Days Discharge Instructions: Apply 3 multi-layer wrap as prescribed. Compression Stockings: Circaid Juxta Lite Compression Wrap (DME) Left Leg Compression Amount: 20-30 mmHG Discharge Instructions: Apply Circaid Juxta Lite Compression Wrap as directed Electronic Signature(s) Signed: 07/27/2021 10:04:47 AM By: Laura Shan DO Signed: 07/27/2021 4:33:59 PM By: Laura Amen RN Entered By: Laura Santiago on 07/27/2021 09:42:25 Laura Santiago, Laura Santiago ([id number]) -------------------------------------------------------------------------------- Problem List Details Patient Name: Laura Santiago, Laura Santiago. Date of Service: 07/27/2021 9:15 AM Medical Record  Number: [id number] Patient Account Number: [id number] Date of Birth/Sex: 1946-08-18 (75 y.o. Female) Treating RN: Laura Santiago Primary Care Provider: SYSTEM, PCP Other Clinician: Referring Provider: Randa Santiago Treating Provider/Extender: Laura Santiago in Treatment: 3 Active Problems ICD-10 Encounter Code Description Active Date MDM Diagnosis S81.802D Unspecified open wound, left lower leg, subsequent encounter 07/13/2021 No Yes I87.2 Venous insufficiency (chronic) (peripheral) 07/06/2021 No Yes 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 07/06/2021 No Yes I10 Essential (primary) hypertension 07/06/2021 No Yes Z79.01 Long term (current) use of anticoagulants 07/06/2021 No Yes Inactive Problems ICD-10 Code Description Active Date Inactive Date S81.802A Unspecified open wound, left lower leg, initial encounter 07/06/2021 07/06/2021 Resolved Problems Electronic Signature(s) Signed: 07/27/2021 10:04:47 AM By: Laura Shan DO Entered By: Laura Santiago on 07/27/2021 09:48:42 Laura Santiago ([id number]) -------------------------------------------------------------------------------- Progress Note Details Patient Name: Laura Santiago. Date of  Service: 07/27/2021 9:15 AM Medical Record Number: [id number] Patient Account Number: [id number] Date of Birth/Sex: Dec 30, 1945 (74 y.o. Female) Treating RN: Laura Santiago Primary Care Provider: SYSTEM, PCP Other Clinician: Referring Provider: Randa Santiago Treating Provider/Extender: Laura Santiago in Treatment: 3 Subjective Chief Complaint Information obtained from Patient Left lower extremity wound History of Present Illness (HPI) Admission 7/27 Ms. Willadene Mounsey is a 75 year old female with a past medical history of ADHD, metastatic breast cancer, stage IV chronic kidney disease, history of DVT on Xarelto and chronic venous insufficiency that presents to the clinic for a chronic left lower extremity wound. She  recently moved to [facility] 4 days ago. She was being followed by wound care center in [name]. She reports a 10-year history of wounds to her left lower extremity that eventually do heal with debridement and compression therapy. She states that the current wound reopened 4 months ago and she is using Vaseline and Coban. She denies signs of infection. 8/3; patient presents for 1 week follow-up. She reports no issues or complaints today. She states she had vascular studies done in the last week. She denies signs of infection. She brought her little service dog with her today. 8/17; patient presents for follow-up. She has missed her last clinic appointment. She states she took the wrap off and attempted to rewrap her leg. She is having difficulty with transportation. She has her service dog with her today. Overall she feels well and reports improvement in wound healing. She denies signs of infection. She reports owning an old Velcro wrap compression and has this at her living facility Patient History Information obtained from Patient. Social History Never smoker. Medical History Eyes Denies history of Cataracts, Glaucoma, Optic Neuritis Ear/Nose/Mouth/Throat Denies history of Chronic sinus problems/congestion, Middle ear problems Hematologic/Lymphatic Denies history of Anemia, Hemophilia, Human Immunodeficiency Virus, Lymphedema, Sickle Cell Disease Respiratory Denies history of Aspiration, Asthma, Chronic Obstructive Pulmonary Disease (COPD), Pneumothorax, Sleep Apnea, Tuberculosis Cardiovascular Patient has history of Hypertension Denies history of Angina, Arrhythmia, Congestive Heart Failure, Coronary Artery Disease, Deep Vein Thrombosis, Hypotension, Myocardial Infarction, Peripheral Arterial Disease, Peripheral Venous Disease, Phlebitis, Vasculitis Gastrointestinal Denies history of Cirrhosis , Colitis, Crohn s, Hepatitis A, Hepatitis B, Hepatitis C Endocrine Denies  history of Type I Diabetes, Type II Diabetes Genitourinary Denies history of End Stage Renal Disease Immunological Denies history of Lupus Erythematosus, Raynaud s, Scleroderma Integumentary (Skin) Denies history of History of Burn, History of pressure wounds Musculoskeletal Patient has history of Osteoarthritis Denies history of Gout, Rheumatoid Arthritis, Osteomyelitis Oncologic Patient has history of Received Chemotherapy, Received Radiation Medical And Surgical History Notes Oncologic breast cancer Laura Santiago, Laura Santiago. ([id number]) Objective Constitutional Vitals Time Taken: 9:15 AM, Height: 66 in, Weight: 153 lbs, BMI: 24.7, Temperature: 98.5 F, Pulse: 87 bpm, Respiratory Rate: 18 breaths/min, Blood Pressure: 120/64 mmHg. General Notes: Left lower extremity: Open wounds to the medial aspect with slough and fibrinous tissue. More granulation tissue present compared to last clinic visit. No obvious signs of infection. Integumentary (Hair, Skin) Wound #1 status is Open. Original cause of wound was Gradually Appeared. The date acquired was: 04/06/2021. The wound has been in treatment 3 weeks. The wound is located on the Left,Medial Lower Leg. The wound measures 8.5cm length x 9.5cm width x 0.2cm depth; 63.421cm^2 area and 12.684cm^3 volume. There is Fat Layer (Subcutaneous Tissue) exposed. There is no tunneling or undermining noted. There is a large amount of serosanguineous drainage noted. There is large (67-100%) red granulation within the wound bed. There is  a small (1-33%) amount of necrotic tissue within the wound bed including Adherent Slough. Assessment Active Problems ICD-10 Unspecified open wound, left lower leg, subsequent encounter Venous insufficiency (chronic) (peripheral) Secondary malignant neoplasm of breast Essential (primary) hypertension Long term (current) use of anticoagulants Patient's wounds show improvement in appearance since last clinic visit. I debrided  nonviable tissue. No signs of infection. Unfortunately patient has a difficult time coming to weekly appointments for wrap change. She has not had compression therapy for the past week and a half. paper towels were over her wounds when she came in. We will try and obtain home health for her. I encouraged weekly appointments if she can make them. We had a long discussion about taking the wrap off after a week and using her compression Velcro wrap. We will order supplies and dressings for her to use under the wrap. We will also order a new juxta lite for her. We will continue with Hydrofera Blue under compression wrap with Santyl and gentamicin cream. At home she will just be using Hydrofera Blue under her Velcro wrap. 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 There was a Excisional Skin/Subcutaneous Tissue Debridement with a total area of 80.75 sq cm performed by Laura Shan, MD. With the following instrument(s): Curette to remove Viable and Non-Viable tissue/material. Material removed includes Subcutaneous Tissue and Slough and. A time out was conducted at 09:40, prior to the start of the procedure. A Minimum amount of bleeding was controlled with Pressure. The procedure was tolerated well. Post Debridement Measurements: 8.5cm length x 9.5cm width x 0.3cm depth; 19.026cm^3 volume. Character of Wound/Ulcer Post Debridement is stable. Severity of Tissue Post Debridement is: Fat layer exposed. 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 There was a Three Layer Compression Therapy Procedure with a pre-treatment ABI of 1.5 by Laura Amen, RN. Post procedure Diagnosis Wound #1: Same as Pre-Procedure Plan Follow-up Appointments: Return Appointment in 1  week. Home Health: Laura Santiago, Laura Santiago ([id number]) ADMIT to [facility] for wound care. May utilize formulary equivalent dressing for wound treatment orders unless otherwise specified. Home Health Nurse may visit PRN to address patient s wound care needs. - [facility] Bathing/ Shower/ Hygiene: May shower with wound dressing protected with water repellent cover or cast protector. No tub bath. WOUND #1: - Lower Leg Wound Laterality: Left, Medial Cleanser: Soap and Water 2 x Per Week/30 Days Discharge Instructions: Gently cleanse wound with antibacterial soap, rinse and pat dry prior to dressing wounds Topical: Gentamicin 2 x Per Week/30 Days Discharge Instructions: Apply as directed by provider. Topical: Santyl Collagenase Ointment, 30 (gm), tube 2 x Per Week/30 Days Discharge Instructions: Apply nickel thick to wound bed only Primary Dressing: Hydrofera Blue Ready Transfer Foam, 2.5x2.5 (in/in) (DME) (Generic) 2 x Per Week/30 Days Discharge Instructions: Apply Hydrofera Blue Ready to wound bed as directed Secondary Dressing: ABD Pad 5x9 (in/in) (DME) (Generic) 2 x Per Week/30 Days Discharge Instructions: Cover with ABD pad Compression Wrap: Profore Lite LF 3 Multilayer Compression Bandaging Laura Santiago 2 x Per Week/30 Days Discharge Instructions: Apply 3 multi-layer wrap as prescribed. Compression Stockings: Circaid Juxta Lite Compression Wrap (DME) Compression Amount: 20-30 mmHg (left) Discharge Instructions: Apply Circaid Juxta Lite Compression Wrap as directed 1. In office sharp debridement 2. Hydrofera Blue, Santyl, gentamicin  under 3 layer compression -in office 3. Order juxta lite 4. Order supplies for patient to do dressings at home 5. Home health 6. Follow-up in 1 week Electronic Signature(s) Signed: 07/27/2021 10:04:47 AM By: Laura Shan DO Entered By: Laura Santiago on 07/27/2021 10:03:56 Laura Santiago  ([id number]) -------------------------------------------------------------------------------- ROS/PFSH Details Patient Name: Laura Santiago. Date of Service: 07/27/2021 9:15 AM Medical Record Number: [id number] Patient Account Number: [id number] Date of Birth/Sex: 05/30/1946 (75 y.o. Female) Treating RN: Laura Santiago Primary Care Provider: SYSTEM, PCP Other Clinician: Referring Provider: Randa Santiago Treating Provider/Extender: Laura Santiago in Treatment: 3 Information Obtained From Patient Eyes Medical History: Negative for: Cataracts; Glaucoma; Optic Neuritis Ear/Nose/Mouth/Throat Medical History: Negative for: Chronic sinus problems/congestion; Middle ear problems Hematologic/Lymphatic Medical History: Negative for: Anemia; Hemophilia; Human Immunodeficiency Virus; Lymphedema; Sickle Cell Disease Respiratory Medical History: Negative for: Aspiration; Asthma; Chronic Obstructive Pulmonary Disease (COPD); Pneumothorax; Sleep Apnea; Tuberculosis Cardiovascular Medical History: Positive for: Hypertension Negative for: Angina; Arrhythmia; Congestive Heart Failure; Coronary Artery Disease; Deep Vein Thrombosis; Hypotension; Myocardial Infarction; Peripheral Arterial Disease; Peripheral Venous Disease; Phlebitis; Vasculitis Gastrointestinal Medical History: Negative for: Cirrhosis ; Colitis; Crohnos; Hepatitis A; Hepatitis B; Hepatitis C Endocrine Medical History: Negative for: Type I Diabetes; Type II Diabetes Genitourinary Medical History: Negative for: End Stage Renal Disease Immunological Medical History: Negative for: Lupus Erythematosus; Raynaudos; Scleroderma Integumentary (Skin) Medical History: Negative for: History of Burn; History of pressure wounds Musculoskeletal Medical History: Positive for: Osteoarthritis Laura Santiago, Laura Santiago ([id number]) Negative for: Gout; Rheumatoid Arthritis; Osteomyelitis Oncologic Medical History: Positive for: Received  Chemotherapy; Received Radiation Past Medical History Notes: breast cancer Immunizations Pneumococcal Vaccine: Received Pneumococcal Vaccination: No Implantable Devices None Family and Social History Never smoker Electronic Signature(s) Signed: 07/27/2021 10:04:47 AM By: Laura Shan DO Signed: 07/27/2021 4:33:59 PM By: Laura Amen RN Entered By: Laura Santiago on 07/27/2021 09:51:35 Laura Santiago, Laura Santiago ([id number]) -------------------------------------------------------------------------------- [place] Details Patient Name: Laura Santiago. Date of Service: 07/27/2021 Medical Record Number: [id number] Patient Account Number: [id number] Date of Birth/Sex: [birth year] (75 y.o. Female) Treating RN: Laura Santiago Primary Care Provider: SYSTEM, PCP Other Clinician: Referring Provider: Randa Santiago Treating Provider/Extender: Laura Santiago in Treatment: 3 Diagnosis Coding ICD-10 Codes Code Description [phone] Unspecified open wound, left lower leg, initial encounter I10 Essential (primary) hypertension 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 Facility Procedures CPT4 Code: 80034917 Description: 91505 - DEB SUBQ TISSUE 20 SQ CM/< Modifier: Quantity: 1 CPT4 Code: Description: ICD-10 Diagnosis Description S81.802A Unspecified open wound, left lower leg, initial encounter Modifier: Quantity: CPT4 Code: [id number] Description: 65537 - DEB SUBQ TISS EA ADDL 20CM Modifier: Quantity: 4 CPT4 Code: Description: ICD-10 Diagnosis Description S81.802A Unspecified open wound, left lower leg, initial encounter Modifier: Quantity: Physician Procedures CPT4 Code: [id number] Description: 11042 - WC PHYS SUBQ TISS 20 SQ CM Modifier: Quantity: 1 CPT4 Code: Description: ICD-10 Diagnosis Description S81.802A Unspecified open wound, left lower leg, initial encounter Modifier: Quantity: CPT4 Code: 6754492 Description: 11045 - WC PHYS SUBQ TISS EA ADDL 20  CM Modifier: Quantity: 4 CPT4 Code: Description: ICD-10 Diagnosis Description S81.802A Unspecified open wound, left lower leg, initial encounter Modifier: Quantity: Electronic Signature(s) Signed: 07/27/2021 10:04:47 AM By: Laura Shan DO Entered By: Laura Santiago on 07/27/2021 10:04:05

## 2021-07-27 NOTE — Procedures (Signed)
PROCEDURE SUMMARY:  Successful fluoroscopic guided port check with contrast injection port de-accessed and sterile dressing applied.  No immediate complications.  Pt tolerated well.   See full procedural report for findings.  EBL < 58mL  Rockney Ghee 07/27/2021 12:26 PM

## 2021-07-29 ENCOUNTER — Inpatient Hospital Stay: Payer: Medicare Other

## 2021-07-29 ENCOUNTER — Inpatient Hospital Stay (HOSPITAL_BASED_OUTPATIENT_CLINIC_OR_DEPARTMENT_OTHER): Payer: Medicare Other | Admitting: Oncology

## 2021-07-29 ENCOUNTER — Other Ambulatory Visit: Payer: Self-pay

## 2021-07-29 VITALS — BP 172/84 | HR 73 | Temp 97.2°F | Resp 18

## 2021-07-29 VITALS — BP 115/75 | HR 72 | Temp 98.1°F | Resp 72 | Ht 64.0 in | Wt 160.8 lb

## 2021-07-29 DIAGNOSIS — C50919 Malignant neoplasm of unspecified site of unspecified female breast: Secondary | ICD-10-CM | POA: Diagnosis not present

## 2021-07-29 DIAGNOSIS — G893 Neoplasm related pain (acute) (chronic): Secondary | ICD-10-CM | POA: Diagnosis not present

## 2021-07-29 DIAGNOSIS — Z5112 Encounter for antineoplastic immunotherapy: Secondary | ICD-10-CM

## 2021-07-29 DIAGNOSIS — Z5111 Encounter for antineoplastic chemotherapy: Secondary | ICD-10-CM

## 2021-07-29 LAB — CBC WITH DIFFERENTIAL/PLATELET
Abs Immature Granulocytes: 0.02 10*3/uL (ref 0.00–0.07)
Basophils Absolute: 0 10*3/uL (ref 0.0–0.1)
Basophils Relative: 1 %
Eosinophils Absolute: 0.1 10*3/uL (ref 0.0–0.5)
Eosinophils Relative: 2 %
HCT: 27.6 % — ABNORMAL LOW (ref 36.0–46.0)
Hemoglobin: 8.2 g/dL — ABNORMAL LOW (ref 12.0–15.0)
Immature Granulocytes: 1 %
Lymphocytes Relative: 22 %
Lymphs Abs: 0.7 10*3/uL (ref 0.7–4.0)
MCH: 29 pg (ref 26.0–34.0)
MCHC: 29.7 g/dL — ABNORMAL LOW (ref 30.0–36.0)
MCV: 97.5 fL (ref 80.0–100.0)
Monocytes Absolute: 0.2 10*3/uL (ref 0.1–1.0)
Monocytes Relative: 7 %
Neutro Abs: 2.2 10*3/uL (ref 1.7–7.7)
Neutrophils Relative %: 67 %
Platelets: 224 10*3/uL (ref 150–400)
RBC: 2.83 MIL/uL — ABNORMAL LOW (ref 3.87–5.11)
RDW: 17.2 % — ABNORMAL HIGH (ref 11.5–15.5)
WBC: 3.2 10*3/uL — ABNORMAL LOW (ref 4.0–10.5)
nRBC: 0 % (ref 0.0–0.2)

## 2021-07-29 LAB — COMPREHENSIVE METABOLIC PANEL
ALT: 12 U/L (ref 0–44)
AST: 19 U/L (ref 15–41)
Albumin: 3.5 g/dL (ref 3.5–5.0)
Alkaline Phosphatase: 58 U/L (ref 38–126)
Anion gap: 7 (ref 5–15)
BUN: 18 mg/dL (ref 8–23)
CO2: 25 mmol/L (ref 22–32)
Calcium: 8.8 mg/dL — ABNORMAL LOW (ref 8.9–10.3)
Chloride: 105 mmol/L (ref 98–111)
Creatinine, Ser: 1.22 mg/dL — ABNORMAL HIGH (ref 0.44–1.00)
GFR, Estimated: 46 mL/min — ABNORMAL LOW (ref >60)
Glucose, Bld: 126 mg/dL — ABNORMAL HIGH (ref 70–99)
Potassium: 3.9 mmol/L (ref 3.5–5.1)
Sodium: 137 mmol/L (ref 135–145)
Total Bilirubin: 0.5 mg/dL (ref 0.3–1.2)
Total Protein: 6.4 g/dL — ABNORMAL LOW (ref 6.5–8.1)

## 2021-07-29 MED ORDER — TRASTUZUMAB-DKST CHEMO 150 MG IV SOLR
450.0000 mg | Freq: Once | INTRAVENOUS | Status: AC
  Administered 2021-07-29: 450 mg via INTRAVENOUS
  Filled 2021-07-29: qty 21.43

## 2021-07-29 MED ORDER — SODIUM CHLORIDE 0.9 % IV SOLN
420.0000 mg | Freq: Once | INTRAVENOUS | Status: AC
  Administered 2021-07-29: 420 mg via INTRAVENOUS
  Filled 2021-07-29: qty 14

## 2021-07-29 MED ORDER — SODIUM CHLORIDE 0.9 % IV SOLN
65.0000 mg/m2 | Freq: Once | INTRAVENOUS | Status: AC
  Administered 2021-07-29: 114 mg via INTRAVENOUS
  Filled 2021-07-29: qty 19

## 2021-07-29 MED ORDER — SODIUM CHLORIDE 0.9 % IV SOLN
Freq: Once | INTRAVENOUS | Status: AC
  Filled 2021-07-29: qty 250

## 2021-07-29 MED ORDER — SODIUM CHLORIDE 0.9 % IV SOLN
10.0000 mg | Freq: Once | INTRAVENOUS | Status: AC
  Administered 2021-07-29: 10 mg via INTRAVENOUS
  Filled 2021-07-29: qty 10

## 2021-07-29 MED ORDER — DIPHENHYDRAMINE HCL 50 MG/ML IJ SOLN
50.0000 mg | Freq: Once | INTRAMUSCULAR | Status: AC
  Administered 2021-07-29: 50 mg via INTRAVENOUS
  Filled 2021-07-29: qty 1

## 2021-07-29 MED ORDER — ACETAMINOPHEN 325 MG PO TABS
650.0000 mg | ORAL_TABLET | Freq: Once | ORAL | Status: AC
  Administered 2021-07-29: 650 mg via ORAL
  Filled 2021-07-29: qty 2

## 2021-07-29 MED ORDER — FAMOTIDINE 20 MG IN NS 100 ML IVPB
20.0000 mg | Freq: Once | INTRAVENOUS | Status: AC
  Administered 2021-07-29: 20 mg via INTRAVENOUS
  Filled 2021-07-29: qty 20

## 2021-07-29 MED ORDER — OXYCODONE HCL 10 MG PO TABS: 10.0000 mg | ORAL_TABLET | ORAL | 0 refills | Status: DC | PRN

## 2021-07-29 MED ORDER — SODIUM CHLORIDE 0.9% FLUSH
10.0000 mL | Freq: Once | INTRAVENOUS | Status: AC
  Administered 2021-07-29: 10 mL via INTRAVENOUS
  Filled 2021-07-29: qty 10

## 2021-07-29 MED ORDER — SODIUM CHLORIDE 0.9 % IV SOLN: 6.0000 mg/kg | Freq: Once | INTRAVENOUS | Status: DC

## 2021-07-29 MED ORDER — HEPARIN SOD (PORK) LOCK FLUSH 100 UNIT/ML IV SOLN
INTRAVENOUS | Status: AC
  Administered 2021-07-29: 500 [IU]
  Filled 2021-07-29: qty 5

## 2021-07-29 NOTE — Progress Notes (Deleted)
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  She had a screening mammogram in September 2014 which showed 2.1 x 2.3 x 1.8 cm irregular mass in her right breast.  It was ER 95% positive PR negative and HER2 positive +3.  She received neoadjuvant chemotherapy with Taxol Herceptin and Perjeta for 4 cycles followed by dose dense AC/Herceptin x4 which she completed in March 2015.  She had a right lumpectomy on 04/03/2014 which showed scant residual invasive ductal carcinoma [id number].  She completed 1 year of adjuvant Herceptin chemotherapy and also completed adjuvant radiation treatment.  She was recommended anastrozole which she took on and off starting November 2015 and stopped sometime in 2020.   She was then hospitalized with neck pain and was found to have lytic lesions involving C5-C6 with pathological vertebral fractures.  She underwent radiation treatment to this area.  Image guided biopsy of the L1 vertebral body showed metastatic carcinoma consistent with breast origin ER 10% PR 0% and HER2 amplified ratio 5.1 average HER2 signal number per cell 15.0 average CEP 17 signals number per cell 3.0.  Baseline echocardiogram on 05/02/2021 showed a normal EF of 62% she was recommended Taxol Herceptin and Perjeta which she received for 2 cycles at [facility] until June 10, 2021   She has  chronic pain from her bone metastases for which she is currently on oxycodone 10 mg every 4 hours as needed and 12 mcg fentanyl patch.  She was seeing pain clinic when she was living in [place].   Patient has also been on Zometa when she was in [place] but she does have some ongoing dental issues.  She has received Xgeva in the past as well.  Her last Rivka Barbara was in July 2021.  Last PET scan was on 03/21/2021 which showed diffuse osseous metastatic disease involving the head neck chest abdomen and pelvis and spine.  Left lung apex hypermetabolic nodule and multiple hypermetabolic liver lesions concerning for disease involvement.  Enlarged hypermetabolic left inguinal lymph nodes along with hypermetabolic external iliac and left supraclavicular lymph nodes   Patient is now moved to [place] to be close to her daughter.  She lives in an independent living.    Interval history-patient was recently treated for symptoms of UTI about 10 days ago.  ECOG PS- *** Pain scale- *** Opioid associated constipation- ***  Review of systems- Review of Systems  Constitutional:  Negative for chills, fever, malaise/fatigue and weight loss.  HENT:  Negative for congestion, ear discharge and nosebleeds.   Eyes:  Negative for blurred vision.  Respiratory:  Negative for cough, hemoptysis, sputum production, shortness of breath and wheezing.   Cardiovascular:  Negative for chest pain, palpitations, orthopnea and claudication.  Gastrointestinal:  Negative for abdominal pain, blood in stool, constipation, diarrhea, heartburn, melena, nausea and vomiting.  Genitourinary:  Negative for dysuria, flank pain, frequency, hematuria and urgency.  Musculoskeletal:  Negative for back pain,  joint pain and myalgias.  Skin:  Negative for rash.  Neurological:  Negative for dizziness, tingling, focal weakness, seizures, weakness and headaches.  Endo/Heme/Allergies:  Does not bruise/bleed easily.  Psychiatric/Behavioral:  Negative for  depression and suicidal ideas. The patient does not have insomnia.       Allergies  Allergen Reactions   Corticosteroids Other (See Comments)    Pt trf from [name] and per primary md for her cancer tx. Notes that it causes agitation intolerance   Sulfa Antibiotics Other (See Comments)    Pt moved from [name] and in MD notes she has allergy but we do not know reactions when taking the drug   Celebrex [Celecoxib] Rash     Past Medical History:  Diagnosis Date   ADHD (attention deficit hyperactivity disorder)    in [place], no date on md note   Anemia    IDA 11/26/2019, Anemia in stage 4 chronic kidney disease ([place]) 09/03/2020   Arthritis    osteoarthritis right knee 09/30/2014   Breast cancer ([place]) 10/05/2013   in [place] +, [place] -, Her 2 is 3+   Cancer related pain 03/28/2021   in [name], md notes spine mets   DVT of lower extremity, bilateral ([place]) 03/30/2014   in [name]   Generalized muscle weakness 03/31/2016   in [name]   GERD (gastroesophageal reflux disease) 05/15/2013   per md in [name]   Hyperparathyroidism, secondary ([place]) 04/25/2018   in [name]   Hypertension 02/20/2016   info from MD in [facility]   Lumbar compression fracture ([place]) 03/18/2021   in [place] loss 02/05/2015   in [name]   Metabolic acidosis 53/97/6734   in [place]   Metabolic syndrome 19/37/9024   in [name]   Osteopenia after menopause 03/29/2016   in [place] of both eyes 09/30/2012   per md in [name] where pt. 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   in [name]   Stasis ulcer of left lower extremity ([place]) 03/29/2016   in [name]     Past Surgical History:  Procedure Laterality Date   CESAREAN SECTION     unknown   fibroid removed  N/A    in [place] - unknown date   IR FLUORO GUIDE CV LINE LEFT  07/27/2021   IR PORT REPAIR CENTRAL VENOUS ACCESS DEVICE Left    In [place] N/A 02/25/2014   in [name]   ovary removed      unknown   [place] CATARACT EXTRACAP,INSERT LENS  Bilateral  Bilateral 09/05/2012   in [place]     unknown    LUMPECTOMY Right 04/03/2014   in [place]    Social History   Socioeconomic History   Marital status: Divorced    Spouse name: Not on file   Number of children: Not on file   Years of education: Not on file   Highest education level: Not on file  Occupational History   Occupation: retired Teacher, music    Comment: [place]  Tobacco Use   Smoking status: Never   Smokeless tobacco: Never  Vaping Use   Vaping Use: Never used  Substance and Sexual Activity   Alcohol use: Not Currently   Drug use: Never   Sexual activity: Not Currently  Other Topics Concern   Not on file  Social History Narrative   Not on file   Social Determinants of Health   Financial  Resource Strain: Not on file  Food Insecurity: Not on file  Transportation Needs: Not on file  Physical Activity: Not on file  Stress: Not on file  Social Connections: Not on file  Intimate Partner Violence: Not on file    Family History  Problem Relation Age of Onset   Pancreatic cancer Mother    Stroke Father    Diabetes Father    Hypertension Father    Heart disease Father    Skin cancer Father    Varicose Veins Father    Skin cancer Brother    Cancer - Prostate Brother      Current Outpatient Medications:    acetaminophen (TYLENOL) 500 MG tablet, Take 500 mg by mouth every 6 (six) hours as needed for mild pain ($RemoveBe'500mg'cpnXAXWZd$  to $R'1000mg'rI$  Q6 hours PRN)., Disp: , Rfl:    amphetamine-dextroamphetamine (ADDERALL XR) 30 MG 24 hr capsule, Take 1 capsule (30 mg total) by mouth daily., Disp: 30 capsule, Rfl: 0   Calcium 200 MG TABS, Take 1 tablet by mouth daily., Disp: , Rfl:    cephALEXin (KEFLEX) 500 MG capsule, Take 1 capsule (500 mg total) by mouth 2 (two) times daily., Disp: 14 capsule, Rfl: 0   fentaNYL (DURAGESIC) 12 MCG/HR, Place 1 patch onto the skin every 3 (three) days., Disp: 10 patch, Rfl: 0   gabapentin (NEURONTIN) 400 MG  capsule, Take 400 mg by mouth 2 (two) times daily., Disp: , Rfl:    lidocaine-prilocaine (EMLA) cream, Apply to affected area once, Disp: 30 g, Rfl: 3   lisinopril (ZESTRIL) 20 MG tablet, Take 20 mg by mouth daily., Disp: , Rfl:    Multiple Vitamin (MULTIVITAMIN ADULT PO), Take 1 tablet by mouth daily., Disp: , Rfl:    ondansetron (ZOFRAN) 8 MG tablet, Take 1 tablet (8 mg total) by mouth 2 (two) times daily as needed (Nausea or vomiting)., Disp: 30 tablet, Rfl: 1   Oxycodone HCl 10 MG TABS, Take 1 tablet (10 mg total) by mouth every 4 (four) hours as needed., Disp: 84 tablet, Rfl: 0   prochlorperazine (COMPAZINE) 10 MG tablet, Take 1 tablet (10 mg total) by mouth every 6 (six) hours as needed (Nausea or vomiting)., Disp: 30 tablet, Rfl: 1   rivaroxaban (XARELTO) 20 MG TABS tablet, Take 1 tablet (20 mg total) by mouth daily with supper., Disp: 30 tablet, Rfl: 3  Physical exam: There were no vitals filed for this visit. Physical Exam HENT:     Head: Normocephalic and atraumatic.  Eyes:     Pupils: Pupils are equal, round, and reactive to light.  Cardiovascular:     Rate and Rhythm: Normal rate and regular rhythm.     Heart sounds: Normal heart sounds.  Pulmonary:     Effort: Pulmonary effort is normal.     Breath sounds: Normal breath sounds.  Abdominal:     General: Bowel sounds are normal.     Palpations: Abdomen is soft.  Musculoskeletal:     Cervical back: Normal range of motion.  Skin:    General: Skin is warm and dry.  Neurological:     Mental Status: She is alert and oriented to person, place, and time.     CMP Latest Ref Rng & Units 07/22/2021  Glucose 70 - 99 mg/dL 115(H)  BUN 8 - 23 mg/dL 18  Creatinine 0.44 - 1.00 mg/dL 1.18(H)  Sodium 135 - 145 mmol/L 136  Potassium 3.5 - 5.1 mmol/L 3.6  Chloride 98 - 111 mmol/L 103  CO2 22 - 32 mmol/L 26  Calcium 8.9 - 10.3 mg/dL 8.7(L)  Total Protein 6.5 - 8.1 g/dL 6.4(L)  Total Bilirubin 0.3 - 1.2 mg/dL 0.4  Alkaline Phos 38 -  126 U/L 61  AST 15 - 41 U/L 20  ALT 0 - 44 U/L 12   CBC Latest Ref Rng & Units 07/22/2021  WBC 4.0 - 10.5 K/uL 4.7  Hemoglobin 12.0 - 15.0 g/dL 9.1(L)  Hematocrit 36.0 - 46.0 % 29.8(L)  Platelets 150 - 400 K/uL 199    No images are attached to the encounter.  DG Chest 2 View  Result Date: 07/01/2021 CLINICAL DATA:  Port-A-Cath placement. EXAM: CHEST - 2 VIEW COMPARISON:  None. FINDINGS: The heart size and mediastinal contours are within normal limits. Both lungs are clear. Left internal jugular Port-A-Cath is noted with distal tip in expected position of the SVC. No pneumothorax or pleural effusion is noted. The visualized skeletal structures are unremarkable. IMPRESSION: Left subclavian Port-A-Cath is noted as described above. Electronically Signed   By: Marijo Conception M.D.   On: 07/01/2021 12:11   IR Fluoro Guide CV Line Left  Result Date: 07/27/2021 INDICATION: Breast cancer, access for chemotherapy, poor functioning port catheter EXAM: FLUOROSCOPIC LEFT IJ POWER PORT CATHETER INJECTION MEDICATIONS: 20 cc omni 300 ANESTHESIA/SEDATION: None. FLUOROSCOPY TIME:  Fluoroscopy Time: 0 minutes 30 seconds (2.7 mGy). COMPLICATIONS: None immediate. PROCEDURE: Informed written consent was obtained from the patient after a thorough discussion of the procedural risks, benefits and alternatives. All questions were addressed. Maximal Sterile Barrier Technique was utilized including caps, mask, sterile gowns, sterile gloves, sterile drape, hand hygiene and skin antiseptic. A timeout was performed prior to the initiation of the procedure. Under sterile conditions, the left IJ power port catheter was accessed. Blood aspirated easily followed by saline flush. Contrast injection performed for port catheter injection. Port catheter tubing is intact. Tip is in the upper SVC. Contrast does flow freely from the port catheter tip. No fibrin sheath. No discontinuity, obstruction or leakage. Port catheter flushed again  with saline followed by heparin flush. Needle removed. IMPRESSION: Patent left IJ power port catheter. Tip is short in the proximal SVC. Negative for fibrin sheath. Electronically Signed   By: [name].  Shick M.D.   On: 07/27/2021 12:34   VAS [place] ABI WITH/WO TBI  Result Date: 07/11/2021  LOWER EXTREMITY DOPPLER STUDY Patient Name:  RUNELL KOVICH  Date of Exam:   07/11/2021 Medical Rec #: [id number]         Accession #:    [id number] Date of Birth: 1946/08/12         Patient Gender: F Patient Age:   23Y Exam Location:  [facility] Procedure:      VAS [place] ABI WITH/WO TBI Referring Phys: [id number] JESSICA RATLIFF HOFFMAN --------------------------------------------------------------------------------  Indications: Swelling; on chemo. Other Factors: Left leg slow healing wound.  Performing Technologist: Concha Norway RVT  Examination Guidelines: A complete evaluation includes at minimum, Doppler waveform signals and systolic blood pressure reading at the level of bilateral brachial, anterior tibial, and posterior tibial arteries, when vessel segments are accessible. Bilateral testing is considered an integral part of a complete examination. Photoelectric Plethysmograph (PPG) waveforms and toe systolic pressure readings are included as required and additional duplex testing as needed. Limited examinations for reoccurring indications may be performed as noted.  ABI Findings: +---------+------------------+-----+---------+--------+ Right    Rt Pressure (mmHg)IndexWaveform Comment  +---------+------------------+-----+---------+--------+ Brachial 142                                      +---------+------------------+-----+---------+--------+  ATA      151               1.06 triphasic         +---------+------------------+-----+---------+--------+ PTA      165               1.16 triphasic         +---------+------------------+-----+---------+--------+ Great Toe137               0.96 Normal             +---------+------------------+-----+---------+--------+ +---------+------------------+-----+---------+-------+ Left     Lt Pressure (mmHg)IndexWaveform Comment +---------+------------------+-----+---------+-------+ ATA      163               1.15 triphasic        +---------+------------------+-----+---------+-------+ PTA      181               1.27 triphasic        +---------+------------------+-----+---------+-------+ Cleotis Nipper               0.89 Normal           +---------+------------------+-----+---------+-------+ +-------+-----------+-----------+------------+------------+ ABI/TBIToday's ABIToday's TBIPrevious ABIPrevious TBI +-------+-----------+-----------+------------+------------+ Right                                                 +-------+-----------+-----------+------------+------------+  Summary: Right: Resting right ankle-brachial index is within normal range. No evidence of significant right lower extremity arterial disease. The right toe-brachial index is normal. Left: Resting left ankle-brachial index is within normal range. No evidence of significant left lower extremity arterial disease. The left toe-brachial index is normal.  *See table(s) above for measurements and observations.  Electronically signed by Hortencia Pilar MD on 07/11/2021 at 5:08:34 PM.    Final      Assessment and plan- Patient is a 75 y.o. female with history of HER2 positive breast cancer in 2014 now diagnosed with diffuse areas of bone liver and lymph node metastases ER weakly +10%, PR negative and HER2 positive.  She is here for on treatment assessment prior to cycle 2-day 1 of Taxol Herceptin and Perjeta  Counts okay to proceed with cycle 2-day 1 of Taxol Herceptin and Perjeta daily.  She will directly proceed for weekly Taxol in 1 week and I will see her back in 2 weeks for cycle 2-day 15 of Taxol alone.  She is due for repeat CT and bone scans in 08/04/2021.  She also has an  echocardiogram scheduled in 3 days time.  Bone metastases: She will need to restart Zometa which will need to be dose reduced given her AKI.  I will plan to do that in 3 weeks time.   Visit Diagnosis 1. Encounter for antineoplastic chemotherapy   2. Encounter for monoclonal antibody treatment for malignancy   3. Metastatic breast cancer [facility])      Dr. Randa Evens, MD, MPH [facility] [phone] 07/29/2021 8:52 AM

## 2021-07-29 NOTE — Progress Notes (Signed)
I connected with Laura Santiago on 07/29/21 at  8:30 AM EDT by video enabled telemedicine visit and verified that I am speaking with the correct person using two identifiers.   I discussed the limitations, risks, security and privacy concerns of performing an evaluation and management service by telemedicine and the availability of in-person appointments. I also discussed with the patient that there may be a patient responsible charge related to this service. The patient expressed understanding and agreed to proceed.  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  She had a screening mammogram in September 2014 which showed 2.1 x 2.3 x 1.8 cm irregular mass in her right breast.  It was ER 95% positive PR negative and HER2 positive +3.  She received neoadjuvant chemotherapy with Taxol Herceptin and Perjeta for 4 cycles followed by dose dense AC/Herceptin x4 which she completed in March 2015.  She had a right lumpectomy on 04/03/2014 which showed scant residual invasive ductal carcinoma [id number].  She completed 1 year of adjuvant Herceptin chemotherapy and also completed adjuvant radiation treatment.  She was recommended anastrozole which she took on and off starting November 2015 and stopped sometime in 2020.   She was then hospitalized with neck pain and was found to have lytic lesions involving C5-C6 with pathological vertebral fractures.   She underwent radiation treatment to this area.  Image guided biopsy of the L1 vertebral body showed metastatic carcinoma consistent with breast origin ER 10% PR 0% and HER2 amplified ratio 5.1 average HER2 signal number per cell 15.0 average CEP 17 signals number per cell 3.0.  Baseline echocardiogram on 05/02/2021 showed a normal EF of 62% she was recommended Taxol Herceptin and Perjeta which she received for 2 cycles at [facility] until June 10, 2021   She has chronic pain from her bone metastases for which she is currently on oxycodone 10 mg every 4 hours as needed and 12 mcg fentanyl patch.  She was seeing pain clinic when she was living in [name].   Patient has also been on Zometa when she was in [name] but she does have some ongoing dental issues.  She has received Xgeva in the past as well.  Her last Delton See was in July 2021.  Last PET scan was on 03/21/2021 which showed diffuse osseous metastatic disease involving the head neck chest abdomen and pelvis and spine.  Left lung apex hypermetabolic nodule and multiple hypermetabolic liver lesions concerning for disease involvement.  Enlarged hypermetabolic left inguinal lymph nodes along with hypermetabolic external iliac and left supraclavicular lymph nodes   Patient is now moved to [place] to be close to her daughter.  She lives in an independent living.    Interval history-patient was recently treated for symptoms of UTI about 10 days ago. Symptoms are presently improving. Has chronic fatigue. Still feels like skin feels like crawling over the forehead   Review of Systems  Constitutional:  Positive for malaise/fatigue. Negative for chills, fever and weight loss.  HENT:  Negative for congestion, ear discharge and nosebleeds.   Eyes:  Negative for blurred vision.  Respiratory:  Negative for cough, hemoptysis, sputum production, shortness of breath and wheezing.   Cardiovascular:  Negative for chest pain, palpitations, orthopnea and claudication.   Gastrointestinal:  Negative for abdominal pain, blood in stool, constipation, diarrhea, heartburn, melena, nausea and vomiting.  Genitourinary:  Negative for dysuria, flank pain, frequency, hematuria and urgency.  Musculoskeletal:  Negative for back pain, joint pain and myalgias.  Skin:  Negative for rash.  Neurological:  Negative for dizziness, tingling, focal weakness, seizures, weakness and headaches.  Endo/Heme/Allergies:  Does not bruise/bleed easily.  Psychiatric/Behavioral:  Negative for depression and suicidal ideas. The patient does not have insomnia.    Allergies  Allergen Reactions   Corticosteroids Other (See Comments)    Pt trf from [name] and per primary md for her cancer tx. Notes that it causes agitation intolerance   Sulfa Antibiotics Other (See Comments)    Pt moved from [name] and in MD notes she has allergy but we do not know reactions when taking the drug   Celebrex [Celecoxib] Rash    Past Medical History:  Diagnosis Date   ADHD (attention deficit hyperactivity disorder)    in [place], no date on md note   Anemia    IDA 11/26/2019, Anemia in stage 4 chronic kidney disease ([place]) 09/03/2020   Arthritis    osteoarthritis right knee 09/30/2014   Breast cancer ([place]) 10/05/2013   in [place] +, [place] -, Her 2 is 3+   Cancer related pain 03/28/2021   in [name], md notes spine mets   DVT of lower extremity, bilateral ([place]) 03/30/2014   in [name]   Generalized muscle weakness 03/31/2016   in [name]   GERD (gastroesophageal reflux disease) 05/15/2013   per md in [name]   Hyperparathyroidism, secondary ([place]) 04/25/2018   in [name]   Hypertension 02/20/2016   info from MD in [facility]   Lumbar compression fracture ([place]) 03/18/2021   in [place] loss 02/05/2015   in [name]   Metabolic acidosis 64/68/0321   in [place]   Metabolic syndrome 22/48/2500   in [name]   Osteopenia after menopause 03/29/2016   in [place] of both eyes 09/30/2012   per md in [name] where pt. 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   in [name]   Stasis ulcer of left lower extremity ([place]) 03/29/2016   in [name]    Past Surgical History:  Procedure Laterality Date   CESAREAN SECTION     unknown   fibroid removed  N/A    in [place] - unknown date   IR FLUORO GUIDE CV LINE LEFT  07/27/2021   IR PORT REPAIR CENTRAL VENOUS ACCESS DEVICE Left    In [place] N/A 02/25/2014   in [name]   ovary removed      unknown   [place] CATARACT EXTRACAP,INSERT LENS Bilateral  Bilateral 09/05/2012   in [place]     unknown    LUMPECTOMY Right 04/03/2014   in [place]    Social History   Socioeconomic History   Marital status: Divorced    Spouse name: Not on file   Number of children: Not on file   Years of education: Not on file   Highest education level: Not on file  Occupational History   Occupation: retired Teacher, music    Comment: In [employer]  [employer]  Tobacco Use   Smoking status: Never   Smokeless tobacco: Never  Vaping Use   Vaping Use: Never used  Substance and Sexual Activity   Alcohol use: Not Currently   Drug use: Never   Sexual activity: Not Currently  Other Topics Concern   Not on file  Social History Narrative   Not on file   Social Determinants of Health   Financial Resource Strain: Not on file  Food Insecurity: Not on file  Transportation Needs: Not on file  Physical Activity: Not on file  Stress: Not on file  Social Connections: Not on file  Intimate Partner Violence: Not on file    Family History  Problem Relation Age of Onset   Pancreatic cancer Mother    Stroke Father    Diabetes Father    Hypertension Father    Heart disease Father    Skin cancer Father    Varicose Veins Father    Skin cancer Brother    Cancer - Prostate Brother      Current Outpatient Medications:    amphetamine-dextroamphetamine (ADDERALL XR) 30 MG 24 hr capsule, Take 1 capsule (30 mg total) by mouth daily., Disp: 30 capsule, Rfl: 0    Calcium 200 MG TABS, Take 1 tablet by mouth daily., Disp: , Rfl:    fentaNYL (DURAGESIC) 12 MCG/HR, Place 1 patch onto the skin every 3 (three) days., Disp: 10 patch, Rfl: 0   gabapentin (NEURONTIN) 400 MG capsule, Take 400 mg by mouth 2 (two) times daily., Disp: , Rfl:    lisinopril (ZESTRIL) 20 MG tablet, Take 20 mg by mouth daily., Disp: , Rfl:    Multiple Vitamin (MULTIVITAMIN ADULT PO), Take 1 tablet by mouth daily., Disp: , Rfl:    ondansetron (ZOFRAN) 8 MG tablet, Take 1 tablet (8 mg total) by mouth 2 (two) times daily as needed (Nausea or vomiting)., Disp: 30 tablet, Rfl: 1   zolpidem (AMBIEN) 5 MG tablet, Take 5 mg by mouth at bedtime as needed for sleep., Disp: , Rfl:    acetaminophen (TYLENOL) 500 MG tablet, Take 500 mg by mouth every 6 (six) hours as needed for mild pain ($RemoveBe'500mg'ewBijkCmW$  to $R'1000mg'Cu$  Q6 hours PRN). (Patient not taking: Reported on 07/29/2021), Disp: , Rfl:    lidocaine-prilocaine (EMLA) cream, Apply to affected area once (Patient not taking: Reported on 07/29/2021), Disp: 30 g, Rfl: 3   Oxycodone HCl 10 MG TABS, Take 1 tablet (10 mg total) by mouth every 4 (four) hours as needed., Disp: 84 tablet, Rfl: 0   prochlorperazine (COMPAZINE) 10 MG tablet, Take 1 tablet (10 mg total) by mouth every 6 (six) hours as needed (Nausea or vomiting). (Patient not taking: Reported on 07/29/2021), Disp: 30 tablet, Rfl: 1   rivaroxaban (XARELTO) 20 MG TABS tablet, Take 1 tablet (20 mg total) by mouth daily with supper., Disp: 30 tablet, Rfl: 3 No current facility-administered medications for this visit.  Facility-Administered Medications Ordered in Other Visits:    heparin lock flush 100 UNIT/ML injection, , , ,    PACLitaxel (TAXOL) 114 mg in sodium chloride 0.9 % 250 mL chemo infusion (</= $RemoveBefor'80mg'hwSvzmwWrbDk$ /m2), 65 mg/m2 (Treatment Plan Recorded), Intravenous, Once, [name], MD, Last Rate: 269 mL/hr at 07/29/21 1309, 114 mg at 07/29/21 1309  DG Chest 2 View  Result Date: 07/01/2021 CLINICAL DATA:   Port-A-Cath placement. EXAM: CHEST - 2 VIEW COMPARISON:  None. FINDINGS: The heart size and mediastinal contours are within normal limits. Both lungs are clear. Left  internal jugular Port-A-Cath is noted with distal tip in expected position of the SVC. No pneumothorax or pleural effusion is noted. The visualized skeletal structures are unremarkable. IMPRESSION: Left subclavian Port-A-Cath is noted as described above. Electronically Signed   By: Marijo Conception M.D.   On: 07/01/2021 12:11   IR Fluoro Guide CV Line Left  Result Date: 07/27/2021 INDICATION: Breast cancer, access for chemotherapy, poor functioning port catheter EXAM: FLUOROSCOPIC LEFT IJ POWER PORT CATHETER INJECTION MEDICATIONS: 20 cc omni 300 ANESTHESIA/SEDATION: None. FLUOROSCOPY TIME:  Fluoroscopy Time: 0 minutes 30 seconds (2.7 mGy). COMPLICATIONS: None immediate. PROCEDURE: Informed written consent was obtained from the patient after a thorough discussion of the procedural risks, benefits and alternatives. All questions were addressed. Maximal Sterile Barrier Technique was utilized including caps, mask, sterile gowns, sterile gloves, sterile drape, hand hygiene and skin antiseptic. A timeout was performed prior to the initiation of the procedure. Under sterile conditions, the left IJ power port catheter was accessed. Blood aspirated easily followed by saline flush. Contrast injection performed for port catheter injection. Port catheter tubing is intact. Tip is in the upper SVC. Contrast does flow freely from the port catheter tip. No fibrin sheath. No discontinuity, obstruction or leakage. Port catheter flushed again with saline followed by heparin flush. Needle removed. IMPRESSION: Patent left IJ power port catheter. Tip is short in the proximal SVC. Negative for fibrin sheath. Electronically Signed   By: [name].  Shick M.D.   On: 07/27/2021 12:34   VAS [place] ABI WITH/WO TBI  Result Date: 07/11/2021  LOWER EXTREMITY DOPPLER STUDY Patient Name:   KYRAH SCHIRO  Date of Exam:   07/11/2021 Medical Rec #: [id number]         Accession #:    [id number] Date of Birth: 1946/12/02         Patient Gender: F Patient Age:   89Y Exam Location:  [facility] Procedure:      VAS [place] ABI WITH/WO TBI Referring Phys: [id number] JESSICA RATLIFF HOFFMAN --------------------------------------------------------------------------------  Indications: Swelling; on chemo. Other Factors: Left leg slow healing wound.  Performing Technologist: Concha Norway RVT  Examination Guidelines: A complete evaluation includes at minimum, Doppler waveform signals and systolic blood pressure reading at the level of bilateral brachial, anterior tibial, and posterior tibial arteries, when vessel segments are accessible. Bilateral testing is considered an integral part of a complete examination. Photoelectric Plethysmograph (PPG) waveforms and toe systolic pressure readings are included as required and additional duplex testing as needed. Limited examinations for reoccurring indications may be performed as noted.  ABI Findings: +---------+------------------+-----+---------+--------+ Right    Rt Pressure (mmHg)IndexWaveform Comment  +---------+------------------+-----+---------+--------+ Brachial 142                                      +---------+------------------+-----+---------+--------+ ATA      151               1.06 triphasic         +---------+------------------+-----+---------+--------+ PTA      165               1.16 triphasic         +---------+------------------+-----+---------+--------+ Great Toe137               0.96 Normal            +---------+------------------+-----+---------+--------+ +---------+------------------+-----+---------+-------+ Left     Lt Pressure (mmHg)IndexWaveform Comment +---------+------------------+-----+---------+-------+ ATA  163               1.15 triphasic         +---------+------------------+-----+---------+-------+ PTA      181               1.27 triphasic        +---------+------------------+-----+---------+-------+ Great Toe127               0.89 Normal           +---------+------------------+-----+---------+-------+ +-------+-----------+-----------+------------+------------+ ABI/TBIToday's ABIToday's TBIPrevious ABIPrevious TBI +-------+-----------+-----------+------------+------------+ Right                                                 +-------+-----------+-----------+------------+------------+  Summary: Right: Resting right ankle-brachial index is within normal range. No evidence of significant right lower extremity arterial disease. The right toe-brachial index is normal. Left: Resting left ankle-brachial index is within normal range. No evidence of significant left lower extremity arterial disease. The left toe-brachial index is normal.  *See table(s) above for measurements and observations.  Electronically signed by Hortencia Pilar MD on 07/11/2021 at 5:08:34 PM.    Final     No images are attached to the encounter.   CMP Latest Ref Rng & Units 07/29/2021  Glucose 70 - 99 mg/dL 126(H)  BUN 8 - 23 mg/dL 18  Creatinine 0.44 - 1.00 mg/dL 1.22(H)  Sodium 135 - 145 mmol/L 137  Potassium 3.5 - 5.1 mmol/L 3.9  Chloride 98 - 111 mmol/L 105  CO2 22 - 32 mmol/L 25  Calcium 8.9 - 10.3 mg/dL 8.8(L)  Total Protein 6.5 - 8.1 g/dL 6.4(L)  Total Bilirubin 0.3 - 1.2 mg/dL 0.5  Alkaline Phos 38 - 126 U/L 58  AST 15 - 41 U/L 19  ALT 0 - 44 U/L 12   CBC Latest Ref Rng & Units 07/29/2021  WBC 4.0 - 10.5 K/uL 3.2(L)  Hemoglobin 12.0 - 15.0 g/dL 8.2(L)  Hematocrit 36.0 - 46.0 % 27.6(L)  Platelets 150 - 400 K/uL 224     Observation/objective:appears in no acute distress over video visit today. Breathing is non labored   Assessment and plan: Patient is a 75 year old female with ER weakly +10%, PR negative and HER2 positive breast cancer  diagnosed with 2014 now with multiple areas of metastatic disease.  She is here for on treatment assessment prior to cycle 2-day 1 of Taxol Herceptin and Perjeta  Counts okay to proceed with cycle 2-day 1 of Taxol Herceptin and Perjeta today.  Port labs and Taxol alone in 1 week and I will see her back in 2 weeks for cycle 2-day 15 of Taxol.  We will also plan to start Zometa on that day.  We again discussed risks and benefits of Zometa including all but not limited to osteonecrosis of the jaw and patient is willing to proceed with that without getting a dental clearance.  She is due for CT and echocardiogram later this month.  Patient would like to bring her pet dog to her treatments but we cannot allow that for infusions.  She is contemplating if she can somehow make an arrangement for her daughter to keep an eye on her dog while she gets treatment  History of DVT: Continue Xarelto  Neoplasm related pain: As needed oxycodone and fentanyl patch to continue  Follow-up instructions:as above  I discussed the assessment and  treatment plan with the patient. The patient was provided an opportunity to ask questions and all were answered. The patient agreed with the plan and demonstrated an understanding of the instructions.   The patient was advised to call back or seek an in-person evaluation if the symptoms worsen or if the condition fails to improve as anticipated.  Visit Diagnosis: 1. Encounter for antineoplastic chemotherapy   2. Encounter for monoclonal antibody treatment for malignancy   3. Metastatic breast cancer ([place])   4. Neoplasm related pain     Dr. Randa Evens, MD, MPH [facility] Tel- [phone] 07/29/2021 1:14 PM

## 2021-07-31 ENCOUNTER — Other Ambulatory Visit: Payer: Self-pay | Admitting: *Deleted

## 2021-07-31 DIAGNOSIS — R5383 Other fatigue: Secondary | ICD-10-CM

## 2021-07-31 DIAGNOSIS — Z5111 Encounter for antineoplastic chemotherapy: Secondary | ICD-10-CM

## 2021-07-31 DIAGNOSIS — D649 Anemia, unspecified: Secondary | ICD-10-CM

## 2021-07-31 DIAGNOSIS — C50919 Malignant neoplasm of unspecified site of unspecified female breast: Secondary | ICD-10-CM

## 2021-08-01 ENCOUNTER — Encounter: Payer: Self-pay | Admitting: Oncology

## 2021-08-01 ENCOUNTER — Other Ambulatory Visit: Payer: Self-pay

## 2021-08-01 ENCOUNTER — Ambulatory Visit
Admission: RE | Admit: 2021-08-01 | Discharge: 2021-08-01 | Disposition: A | Discharge status: Home / Self Care | Payer: Medicare Other | Source: Ambulatory Visit | Attending: Oncology | Admitting: Oncology

## 2021-08-01 DIAGNOSIS — Z0189 Encounter for other specified special examinations: Secondary | ICD-10-CM

## 2021-08-01 DIAGNOSIS — C50919 Malignant neoplasm of unspecified site of unspecified female breast: Secondary | ICD-10-CM | POA: Insufficient documentation

## 2021-08-01 DIAGNOSIS — R9431 Abnormal electrocardiogram [ECG] [EKG]: Secondary | ICD-10-CM | POA: Insufficient documentation

## 2021-08-01 DIAGNOSIS — I081 Rheumatic disorders of both mitral and tricuspid valves: Secondary | ICD-10-CM | POA: Insufficient documentation

## 2021-08-01 DIAGNOSIS — C7951 Secondary malignant neoplasm of bone: Secondary | ICD-10-CM | POA: Diagnosis not present

## 2021-08-01 DIAGNOSIS — I10 Essential (primary) hypertension: Secondary | ICD-10-CM | POA: Insufficient documentation

## 2021-08-01 LAB — ECHOCARDIOGRAM COMPLETE
AR max vel: 2.55 cm2
AV Area VTI: 2.49 cm2
AV Area mean vel: 2.36 cm2
AV Mean grad: 8 mmHg
AV Peak grad: 14.7 mmHg
Ao pk vel: 1.92 m/s
Area-P 1/2: 5.42 cm2
MV VTI: 3.07 cm2
S' Lateral: 2.3 cm

## 2021-08-01 NOTE — Progress Notes (Signed)
*  PRELIMINARY RESULTS* Echocardiogram 2D Echocardiogram has been performed.  Laura Santiago 08/01/2021, 11:56 AM

## 2021-08-03 ENCOUNTER — Ambulatory Visit: Payer: Medicare Other | Admitting: Internal Medicine

## 2021-08-04 ENCOUNTER — Other Ambulatory Visit: Payer: Self-pay

## 2021-08-04 ENCOUNTER — Encounter
Admission: RE | Admit: 2021-08-04 | Discharge: 2021-08-04 | Disposition: A | Discharge status: Home / Self Care | Payer: Medicare Other | Source: Ambulatory Visit | Attending: Oncology | Admitting: Oncology

## 2021-08-04 ENCOUNTER — Ambulatory Visit
Admission: RE | Admit: 2021-08-04 | Discharge: 2021-08-04 | Disposition: A | Discharge status: Home / Self Care | Payer: Medicare Other | Source: Ambulatory Visit | Attending: Oncology | Admitting: Oncology

## 2021-08-04 DIAGNOSIS — C50919 Malignant neoplasm of unspecified site of unspecified female breast: Secondary | ICD-10-CM

## 2021-08-04 DIAGNOSIS — C7951 Secondary malignant neoplasm of bone: Secondary | ICD-10-CM

## 2021-08-04 MED ORDER — TECHNETIUM TC 99M MEDRONATE IV KIT
20.0000 | PACK | Freq: Once | INTRAVENOUS | Status: AC | PRN
  Administered 2021-08-04: 21.24 via INTRAVENOUS

## 2021-08-05 ENCOUNTER — Inpatient Hospital Stay: Payer: Medicare Other

## 2021-08-05 VITALS — BP 115/56 | HR 73 | Temp 96.8°F | Resp 20 | Wt 160.0 lb

## 2021-08-05 DIAGNOSIS — Z5111 Encounter for antineoplastic chemotherapy: Secondary | ICD-10-CM

## 2021-08-05 DIAGNOSIS — D649 Anemia, unspecified: Secondary | ICD-10-CM

## 2021-08-05 DIAGNOSIS — Z95828 Presence of other vascular implants and grafts: Secondary | ICD-10-CM

## 2021-08-05 DIAGNOSIS — R5383 Other fatigue: Secondary | ICD-10-CM

## 2021-08-05 DIAGNOSIS — C50919 Malignant neoplasm of unspecified site of unspecified female breast: Secondary | ICD-10-CM

## 2021-08-05 DIAGNOSIS — Z5112 Encounter for antineoplastic immunotherapy: Secondary | ICD-10-CM | POA: Diagnosis not present

## 2021-08-05 LAB — CBC WITH DIFFERENTIAL/PLATELET
Abs Immature Granulocytes: 0.04 10*3/uL (ref 0.00–0.07)
Basophils Absolute: 0 10*3/uL (ref 0.0–0.1)
Basophils Relative: 1 %
Eosinophils Absolute: 0.1 10*3/uL (ref 0.0–0.5)
Eosinophils Relative: 2 %
HCT: 28 % — ABNORMAL LOW (ref 36.0–46.0)
Hemoglobin: 8.8 g/dL — ABNORMAL LOW (ref 12.0–15.0)
Immature Granulocytes: 1 %
Lymphocytes Relative: 14 %
Lymphs Abs: 0.5 10*3/uL — ABNORMAL LOW (ref 0.7–4.0)
MCH: 29.8 pg (ref 26.0–34.0)
MCHC: 31.4 g/dL (ref 30.0–36.0)
MCV: 94.9 fL (ref 80.0–100.0)
Monocytes Absolute: 0.3 10*3/uL (ref 0.1–1.0)
Monocytes Relative: 8 %
Neutro Abs: 2.7 10*3/uL (ref 1.7–7.7)
Neutrophils Relative %: 74 %
Platelets: 238 10*3/uL (ref 150–400)
RBC: 2.95 MIL/uL — ABNORMAL LOW (ref 3.87–5.11)
RDW: 17.2 % — ABNORMAL HIGH (ref 11.5–15.5)
WBC: 3.6 10*3/uL — ABNORMAL LOW (ref 4.0–10.5)
nRBC: 0 % (ref 0.0–0.2)

## 2021-08-05 LAB — COMPREHENSIVE METABOLIC PANEL
ALT: 12 U/L (ref 0–44)
AST: 19 U/L (ref 15–41)
Albumin: 3.5 g/dL (ref 3.5–5.0)
Alkaline Phosphatase: 61 U/L (ref 38–126)
Anion gap: 7 (ref 5–15)
BUN: 18 mg/dL (ref 8–23)
CO2: 25 mmol/L (ref 22–32)
Calcium: 8.4 mg/dL — ABNORMAL LOW (ref 8.9–10.3)
Chloride: 105 mmol/L (ref 98–111)
Creatinine, Ser: 1.07 mg/dL — ABNORMAL HIGH (ref 0.44–1.00)
GFR, Estimated: 54 mL/min — ABNORMAL LOW (ref >60)
Glucose, Bld: 119 mg/dL — ABNORMAL HIGH (ref 70–99)
Potassium: 4.1 mmol/L (ref 3.5–5.1)
Sodium: 137 mmol/L (ref 135–145)
Total Bilirubin: 0.4 mg/dL (ref 0.3–1.2)
Total Protein: 6.2 g/dL — ABNORMAL LOW (ref 6.5–8.1)

## 2021-08-05 LAB — FOLATE: Folate: 8.8 ng/mL (ref >5.9)

## 2021-08-05 LAB — IRON AND TIBC
Iron: 29 ug/dL (ref 28–170)
Saturation Ratios: 11 % (ref 10.4–31.8)
TIBC: 269 ug/dL (ref 250–450)
UIBC: 240 ug/dL

## 2021-08-05 LAB — VITAMIN B12: Vitamin B-12: 205 pg/mL (ref 180–914)

## 2021-08-05 LAB — FERRITIN: Ferritin: 98 ng/mL (ref 11–307)

## 2021-08-05 MED ORDER — FAMOTIDINE 20 MG IN NS 100 ML IVPB
20.0000 mg | Freq: Once | INTRAVENOUS | Status: AC
  Administered 2021-08-05: 20 mg via INTRAVENOUS
  Filled 2021-08-05: qty 100
  Filled 2021-08-05: qty 20

## 2021-08-05 MED ORDER — SODIUM CHLORIDE 0.9% FLUSH
10.0000 mL | Freq: Once | INTRAVENOUS | Status: AC
Start: 2021-08-05 — End: 2021-08-05
  Administered 2021-08-05: 10 mL via INTRAVENOUS
  Filled 2021-08-05: qty 10

## 2021-08-05 MED ORDER — SODIUM CHLORIDE 0.9 % IV SOLN
Freq: Once | INTRAVENOUS | Status: AC
  Filled 2021-08-05: qty 250

## 2021-08-05 MED ORDER — SODIUM CHLORIDE 0.9 % IV SOLN
10.0000 mg | Freq: Once | INTRAVENOUS | Status: AC
  Administered 2021-08-05: 10 mg via INTRAVENOUS
  Filled 2021-08-05: qty 10

## 2021-08-05 MED ORDER — DIPHENHYDRAMINE HCL 50 MG/ML IJ SOLN
50.0000 mg | Freq: Once | INTRAMUSCULAR | Status: AC
  Administered 2021-08-05: 50 mg via INTRAVENOUS
  Filled 2021-08-05: qty 1

## 2021-08-05 MED ORDER — SODIUM CHLORIDE 0.9 % IV SOLN
65.0000 mg/m2 | Freq: Once | INTRAVENOUS | Status: AC
  Administered 2021-08-05: 114 mg via INTRAVENOUS
  Filled 2021-08-05: qty 19

## 2021-08-05 MED ORDER — HEPARIN SOD (PORK) LOCK FLUSH 100 UNIT/ML IV SOLN
500.0000 [IU] | Freq: Once | INTRAVENOUS | Status: AC
  Administered 2021-08-05: 500 [IU] via INTRAVENOUS
  Filled 2021-08-05: qty 5

## 2021-08-05 NOTE — Patient Instructions (Signed)
[facility]  Discharge Instructions: Thank you for choosing [facility] to provide your oncology and hematology care.  If you have a lab appointment with the [facility], please go directly to the [facility] and check in at the registration area.  Wear comfortable clothing and clothing appropriate for easy access to any Portacath or PICC line.   We strive to give you quality time with your provider. You may need to reschedule your appointment if you arrive late (15 or more minutes).  Arriving late affects you and other patients whose appointments are after yours.  Also, if you miss three or more appointments without notifying the office, you may be dismissed from the clinic at the provider's discretion.      For prescription refill requests, have your pharmacy contact our office and allow 72 hours for refills to be completed.    Today you received the following chemotherapy and/or immunotherapy agents: Taxol      To help prevent nausea and vomiting after your treatment, we encourage you to take your nausea medication as directed.  BELOW ARE SYMPTOMS THAT SHOULD BE REPORTED IMMEDIATELY: *FEVER GREATER THAN 100.4 F (38 C) OR HIGHER *CHILLS OR SWEATING *NAUSEA AND VOMITING THAT IS NOT CONTROLLED WITH YOUR NAUSEA MEDICATION *UNUSUAL SHORTNESS OF BREATH *UNUSUAL BRUISING OR BLEEDING *URINARY PROBLEMS (pain or burning when urinating, or frequent urination) *BOWEL PROBLEMS (unusual diarrhea, constipation, pain near the anus) TENDERNESS IN MOUTH AND THROAT WITH OR WITHOUT PRESENCE OF ULCERS (sore throat, sores in mouth, or a toothache) UNUSUAL RASH, SWELLING OR PAIN  UNUSUAL VAGINAL DISCHARGE OR ITCHING   Items with * indicate a potential emergency and should be followed up as soon as possible or go to the Emergency Department if any problems should occur.  Please show the CHEMOTHERAPY ALERT CARD or IMMUNOTHERAPY ALERT CARD at check-in to  the Emergency Department and triage nurse.  Should you have questions after your visit or need to cancel or reschedule your appointment, please contact [facility]  [phone] and follow the prompts.  Office hours are 8:00 a.m. to 4:30 p.m. Monday - Friday. Please note that voicemails left after 4:00 p.m. may not be returned until the following business day.  We are closed weekends and major holidays. You have access to a nurse at all times for urgent questions. Please call the main number to the clinic [phone] and follow the prompts.  For any non-urgent questions, you may also contact your provider using MyChart. We now offer e-Visits for anyone 26 and older to request care online for non-urgent symptoms. For details visit [email or web address].   Also download the MyChart app! Go to the app store, search "MyChart", open the app, select [facility], and log in with your MyChart username and password.  Due to Covid, a mask is required upon entering the hospital/clinic. If you do not have a mask, one will be given to you upon arrival. For doctor visits, patients may have 1 support person aged 32 or older with them. For treatment visits, patients cannot have anyone with them due to current Covid guidelines and our immunocompromised population. Paclitaxel injection What is this medication? PACLITAXEL (PAK li TAX el) is a chemotherapy drug. It targets fast dividing cells, like cancer cells, and causes these cells to die. This medicine is used to treat ovarian cancer, breast cancer, lung cancer, Kaposi's sarcoma, andother cancers. This medicine may be used for other purposes; ask your health care  provider orpharmacist if you have questions. COMMON BRAND NAME(S): Onxol, Taxol What should I tell my care team before I take this medication? They need to know if you have any of these conditions: history of irregular heartbeat liver disease low blood counts, like  low white cell, platelet, or red cell counts lung or breathing disease, like asthma tingling of the fingers or toes, or other nerve disorder an unusual or allergic reaction to paclitaxel, alcohol, polyoxyethylated castor oil, other chemotherapy, other medicines, foods, dyes, or preservatives pregnant or trying to get pregnant breast-feeding How should I use this medication? This drug is given as an infusion into a vein. It is administered in a hospitalor clinic by a specially trained health care professional. Talk to your pediatrician regarding the use of this medicine in children.Special care may be needed. Overdosage: If you think you have taken too much of this medicine contact apoison control center or emergency room at once. NOTE: This medicine is only for you. Do not share this medicine with others. What if I miss a dose? It is important not to miss your dose. Call your doctor or health careprofessional if you are unable to keep an appointment. What may interact with this medication? Do not take this medicine with any of the following medications: live virus vaccines This medicine may also interact with the following medications: antiviral medicines for hepatitis, HIV or AIDS certain antibiotics like erythromycin and clarithromycin certain medicines for fungal infections like ketoconazole and itraconazole certain medicines for seizures like carbamazepine, phenobarbital, phenytoin gemfibrozil nefazodone rifampin St. John's wort This list may not describe all possible interactions. Give your health care provider a list of all the medicines, herbs, non-prescription drugs, or dietary supplements you use. Also tell them if you smoke, drink alcohol, or use illegaldrugs. Some items may interact with your medicine. What should I watch for while using this medication? Your condition will be monitored carefully while you are receiving this medicine. You will need important blood work done  while you are taking thismedicine. This medicine can cause serious allergic reactions. To reduce your risk you will need to take other medicine(s) before treatment with this medicine. If you experience allergic reactions like skin rash, itching or hives, swelling of theface, lips, or tongue, tell your doctor or health care professional right away. In some cases, you may be given additional medicines to help with side effects.Follow all directions for their use. This drug may make you feel generally unwell. This is not uncommon, as chemotherapy can affect healthy cells as well as cancer cells. Report any side effects. Continue your course of treatment even though you feel ill unless yourdoctor tells you to stop. Call your doctor or health care professional for advice if you get a fever, chills or sore throat, or other symptoms of a cold or flu. Do not treat yourself. This drug decreases your body's ability to fight infections. Try toavoid being around people who are sick. This medicine may increase your risk to bruise or bleed. Call your doctor orhealth care professional if you notice any unusual bleeding. Be careful brushing and flossing your teeth or using a toothpick because you may get an infection or bleed more easily. If you have any dental work done,tell your dentist you are receiving this medicine. Avoid taking products that contain aspirin, acetaminophen, ibuprofen, naproxen, or ketoprofen unless instructed by your doctor. These medicines may hide afever. Do not become pregnant while taking this medicine. Women should inform their doctor if they wish  to become pregnant or think they might be pregnant. There is a potential for serious side effects to an unborn child. Talk to your health care professional or pharmacist for more information. Do not breast-feed aninfant while taking this medicine. Men are advised not to father a child while receiving this medicine. This product may contain alcohol. Ask  your pharmacist or healthcare provider if this medicine contains alcohol. Be sure to tell all healthcare providers you are taking this medicine. Certain medicines, like metronidazole and disulfiram, can cause an unpleasant reaction when taken with alcohol. The reaction includes flushing, headache, nausea, vomiting, sweating, and increased thirst. Thereaction can last from 30 minutes to several hours. What side effects may I notice from receiving this medication? Side effects that you should report to your doctor or health care professionalas soon as possible: allergic reactions like skin rash, itching or hives, swelling of the face, lips, or tongue breathing problems changes in vision fast, irregular heartbeat high or low blood pressure mouth sores pain, tingling, numbness in the hands or feet signs of decreased platelets or bleeding - bruising, pinpoint red spots on the skin, black, tarry stools, blood in the urine signs of decreased red blood cells - unusually weak or tired, feeling faint or lightheaded, falls signs of infection - fever or chills, cough, sore throat, pain or difficulty passing urine signs and symptoms of liver injury like dark yellow or brown urine; general ill feeling or flu-like symptoms; light-colored stools; loss of appetite; nausea; right upper belly pain; unusually weak or tired; yellowing of the eyes or skin swelling of the ankles, feet, hands unusually slow heartbeat Side effects that usually do not require medical attention (report to yourdoctor or health care professional if they continue or are bothersome): diarrhea hair loss loss of appetite muscle or joint pain nausea, vomiting pain, redness, or irritation at site where injected tiredness This list may not describe all possible side effects. Call your doctor for medical advice about side effects. You may report side effects to FDA at1-800-FDA-1088. Where should I keep my medication? This drug is given in a  hospital or clinic and will not be stored at home. NOTE: This sheet is a summary. It may not cover all possible information. If you have questions about this medicine, talk to your doctor, pharmacist, orhealth care provider.  2022 [employer]Gold Standard (2019-10-29 13:37:23)

## 2021-08-12 ENCOUNTER — Inpatient Hospital Stay: Payer: Medicare Other | Attending: Oncology

## 2021-08-12 ENCOUNTER — Inpatient Hospital Stay (HOSPITAL_BASED_OUTPATIENT_CLINIC_OR_DEPARTMENT_OTHER): Payer: Medicare Other | Admitting: Oncology

## 2021-08-12 ENCOUNTER — Inpatient Hospital Stay: Payer: Medicare Other

## 2021-08-12 ENCOUNTER — Telehealth: Payer: Self-pay

## 2021-08-12 ENCOUNTER — Ambulatory Visit
Admission: RE | Admit: 2021-08-12 | Discharge: 2021-08-12 | Disposition: A | Discharge status: Home / Self Care | Payer: Self-pay | Source: Ambulatory Visit | Attending: Oncology | Admitting: Oncology

## 2021-08-12 ENCOUNTER — Encounter: Payer: Self-pay | Admitting: Oncology

## 2021-08-12 VITALS — BP 126/74 | HR 76 | Resp 18 | Wt 156.6 lb

## 2021-08-12 VITALS — BP 143/79 | HR 99

## 2021-08-12 DIAGNOSIS — Z86718 Personal history of other venous thrombosis and embolism: Secondary | ICD-10-CM | POA: Diagnosis not present

## 2021-08-12 DIAGNOSIS — Z7901 Long term (current) use of anticoagulants: Secondary | ICD-10-CM | POA: Diagnosis not present

## 2021-08-12 DIAGNOSIS — I129 Hypertensive chronic kidney disease with stage 1 through stage 4 chronic kidney disease, or unspecified chronic kidney disease: Secondary | ICD-10-CM | POA: Diagnosis not present

## 2021-08-12 DIAGNOSIS — Z5181 Encounter for therapeutic drug level monitoring: Secondary | ICD-10-CM

## 2021-08-12 DIAGNOSIS — L03115 Cellulitis of right lower limb: Secondary | ICD-10-CM | POA: Diagnosis not present

## 2021-08-12 DIAGNOSIS — C779 Secondary and unspecified malignant neoplasm of lymph node, unspecified: Secondary | ICD-10-CM | POA: Insufficient documentation

## 2021-08-12 DIAGNOSIS — Z923 Personal history of irradiation: Secondary | ICD-10-CM | POA: Insufficient documentation

## 2021-08-12 DIAGNOSIS — C787 Secondary malignant neoplasm of liver and intrahepatic bile duct: Secondary | ICD-10-CM | POA: Insufficient documentation

## 2021-08-12 DIAGNOSIS — Z79899 Other long term (current) drug therapy: Secondary | ICD-10-CM | POA: Insufficient documentation

## 2021-08-12 DIAGNOSIS — M8580 Other specified disorders of bone density and structure, unspecified site: Secondary | ICD-10-CM | POA: Diagnosis not present

## 2021-08-12 DIAGNOSIS — C7951 Secondary malignant neoplasm of bone: Secondary | ICD-10-CM | POA: Insufficient documentation

## 2021-08-12 DIAGNOSIS — E039 Hypothyroidism, unspecified: Secondary | ICD-10-CM | POA: Insufficient documentation

## 2021-08-12 DIAGNOSIS — D631 Anemia in chronic kidney disease: Secondary | ICD-10-CM | POA: Diagnosis not present

## 2021-08-12 DIAGNOSIS — Z5111 Encounter for antineoplastic chemotherapy: Secondary | ICD-10-CM | POA: Diagnosis present

## 2021-08-12 DIAGNOSIS — Z5112 Encounter for antineoplastic immunotherapy: Secondary | ICD-10-CM | POA: Diagnosis present

## 2021-08-12 DIAGNOSIS — Z170 Estrogen receptor positive status [ER+]: Secondary | ICD-10-CM | POA: Insufficient documentation

## 2021-08-12 DIAGNOSIS — N184 Chronic kidney disease, stage 4 (severe): Secondary | ICD-10-CM | POA: Insufficient documentation

## 2021-08-12 DIAGNOSIS — Z7983 Long term (current) use of bisphosphonates: Secondary | ICD-10-CM

## 2021-08-12 DIAGNOSIS — C50919 Malignant neoplasm of unspecified site of unspecified female breast: Secondary | ICD-10-CM

## 2021-08-12 DIAGNOSIS — C50911 Malignant neoplasm of unspecified site of right female breast: Secondary | ICD-10-CM | POA: Diagnosis present

## 2021-08-12 DIAGNOSIS — G629 Polyneuropathy, unspecified: Secondary | ICD-10-CM | POA: Diagnosis not present

## 2021-08-12 LAB — COMPREHENSIVE METABOLIC PANEL
ALT: 14 U/L (ref 0–44)
AST: 22 U/L (ref 15–41)
Albumin: 3.5 g/dL (ref 3.5–5.0)
Alkaline Phosphatase: 61 U/L (ref 38–126)
Anion gap: 8 (ref 5–15)
BUN: 19 mg/dL (ref 8–23)
CO2: 25 mmol/L (ref 22–32)
Calcium: 8.6 mg/dL — ABNORMAL LOW (ref 8.9–10.3)
Chloride: 104 mmol/L (ref 98–111)
Creatinine, Ser: 1.12 mg/dL — ABNORMAL HIGH (ref 0.44–1.00)
GFR, Estimated: 51 mL/min — ABNORMAL LOW (ref >60)
Glucose, Bld: 107 mg/dL — ABNORMAL HIGH (ref 70–99)
Potassium: 4.1 mmol/L (ref 3.5–5.1)
Sodium: 137 mmol/L (ref 135–145)
Total Bilirubin: 0.3 mg/dL (ref 0.3–1.2)
Total Protein: 6.4 g/dL — ABNORMAL LOW (ref 6.5–8.1)

## 2021-08-12 LAB — CBC WITH DIFFERENTIAL/PLATELET
Abs Immature Granulocytes: 0.04 10*3/uL (ref 0.00–0.07)
Basophils Absolute: 0 10*3/uL (ref 0.0–0.1)
Basophils Relative: 1 %
Eosinophils Absolute: 0.1 10*3/uL (ref 0.0–0.5)
Eosinophils Relative: 2 %
HCT: 27.2 % — ABNORMAL LOW (ref 36.0–46.0)
Hemoglobin: 8.3 g/dL — ABNORMAL LOW (ref 12.0–15.0)
Immature Granulocytes: 1 %
Lymphocytes Relative: 20 %
Lymphs Abs: 0.9 10*3/uL (ref 0.7–4.0)
MCH: 29.1 pg (ref 26.0–34.0)
MCHC: 30.5 g/dL (ref 30.0–36.0)
MCV: 95.4 fL (ref 80.0–100.0)
Monocytes Absolute: 0.3 10*3/uL (ref 0.1–1.0)
Monocytes Relative: 8 %
Neutro Abs: 3 10*3/uL (ref 1.7–7.7)
Neutrophils Relative %: 68 %
Platelets: 264 10*3/uL (ref 150–400)
RBC: 2.85 MIL/uL — ABNORMAL LOW (ref 3.87–5.11)
RDW: 16.7 % — ABNORMAL HIGH (ref 11.5–15.5)
WBC: 4.4 10*3/uL (ref 4.0–10.5)
nRBC: 0 % (ref 0.0–0.2)

## 2021-08-12 MED ORDER — SODIUM CHLORIDE 0.9 % IV SOLN
Freq: Once | INTRAVENOUS | Status: AC
  Filled 2021-08-12: qty 250

## 2021-08-12 MED ORDER — SODIUM CHLORIDE 0.9 % IV SOLN
65.0000 mg/m2 | Freq: Once | INTRAVENOUS | Status: AC
  Administered 2021-08-12: 114 mg via INTRAVENOUS
  Filled 2021-08-12: qty 19

## 2021-08-12 MED ORDER — DIPHENHYDRAMINE HCL 50 MG/ML IJ SOLN
50.0000 mg | Freq: Once | INTRAMUSCULAR | Status: AC
  Administered 2021-08-12: 50 mg via INTRAVENOUS
  Filled 2021-08-12: qty 1

## 2021-08-12 MED ORDER — SODIUM CHLORIDE 0.9% FLUSH
10.0000 mL | Freq: Once | INTRAVENOUS | Status: DC
  Filled 2021-08-12: qty 10

## 2021-08-12 MED ORDER — DOXYCYCLINE HYCLATE 100 MG PO TABS: 100.0000 mg | ORAL_TABLET | Freq: Two times a day (BID) | ORAL | 0 refills | Status: AC

## 2021-08-12 MED ORDER — ZOLEDRONIC ACID 4 MG/5ML IV CONC
3.3000 mg | Freq: Once | INTRAVENOUS | Status: AC
  Administered 2021-08-12: 3.3 mg via INTRAVENOUS
  Filled 2021-08-12: qty 4.13

## 2021-08-12 MED ORDER — FAMOTIDINE 20 MG IN NS 100 ML IVPB
20.0000 mg | Freq: Once | INTRAVENOUS | Status: AC
  Administered 2021-08-12: 20 mg via INTRAVENOUS
  Filled 2021-08-12: qty 100
  Filled 2021-08-12: qty 20

## 2021-08-12 MED ORDER — HEPARIN SOD (PORK) LOCK FLUSH 100 UNIT/ML IV SOLN
500.0000 [IU] | Freq: Once | INTRAVENOUS | Status: AC | PRN
  Administered 2021-08-12: 500 [IU]
  Filled 2021-08-12: qty 5

## 2021-08-12 MED ORDER — SODIUM CHLORIDE 0.9 % IV SOLN
10.0000 mg | Freq: Once | INTRAVENOUS | Status: AC
  Administered 2021-08-12: 10 mg via INTRAVENOUS
  Filled 2021-08-12: qty 10

## 2021-08-12 MED ORDER — HEPARIN SOD (PORK) LOCK FLUSH 100 UNIT/ML IV SOLN
500.0000 [IU] | Freq: Once | INTRAVENOUS | Status: DC
  Filled 2021-08-12: qty 5

## 2021-08-12 NOTE — Progress Notes (Signed)
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  She had a screening mammogram in September 2014 which showed 2.1 x 2.3 x 1.8 cm irregular mass in her right breast.  It was ER 95% positive PR negative and HER2 positive +3.  She received neoadjuvant chemotherapy with Taxol Herceptin and Perjeta for 4 cycles followed by dose dense AC/Herceptin x4 which she completed in March 2015.  She had a right lumpectomy on 04/03/2014 which showed scant residual invasive ductal carcinoma [id number].  She completed 1 year of adjuvant Herceptin chemotherapy and also completed adjuvant radiation treatment.  She was recommended anastrozole which she took on and off starting November 2015 and stopped sometime in 2020.   She was then hospitalized with neck pain and was found to have lytic lesions involving C5-C6 with pathological vertebral fractures.  She underwent radiation treatment to this area.  Image guided biopsy of the L1 vertebral body showed metastatic carcinoma consistent with breast origin ER 10% PR 0% and HER2 amplified ratio 5.1 average HER2 signal number per cell 15.0 average CEP 17 signals number per cell 3.0.  Baseline echocardiogram on 05/02/2021 showed a normal EF of 62% she was recommended Taxol Herceptin and Perjeta which she received for 2 cycles at [facility] until June 10, 2021   She  has chronic pain from her bone metastases for which she is currently on oxycodone 10 mg every 4 hours as needed and 12 mcg fentanyl patch.  She was seeing pain clinic when she was living in [place].   Patient has also been on Zometa when she was in [place] but she does have some ongoing dental issues.  She has received Xgeva in the past as well.  Her last Rivka Barbara was in July 2021.  Last PET scan was on 03/21/2021 which showed diffuse osseous metastatic disease involving the head neck chest abdomen and pelvis and spine.  Left lung apex hypermetabolic nodule and multiple hypermetabolic liver lesions concerning for disease involvement.  Enlarged hypermetabolic left inguinal lymph nodes along with hypermetabolic external iliac and left supraclavicular lymph nodes   Patient is now moved to [place] to be close to her daughter.  She lives in an independent living.   Interval history- Patient reports her right leg is appearing more swollen and red over the last week or so.  She does have chronic left lower extremity ulcerations for which she is seen by wound care.  She still has a feeling as if her skin crawls over her forehead and over her eyes and she has similar sensation in her legs as well.  Patient reports sometimes her gums feel sore and she has tooth aches.  She has not seen a dentist after moving to [place] but saw a Transport planner back in [place].   ECOG PS- 2 Pain scale- 3 Opioid associated constipation- no  Review of systems- Review of Systems  Constitutional:  Positive for malaise/fatigue.  Negative for chills, fever and weight loss.  HENT:  Negative for congestion, ear discharge and nosebleeds.   Eyes:  Negative for blurred vision.  Respiratory:  Negative for cough, hemoptysis, sputum production, shortness of breath and wheezing.   Cardiovascular:  Negative for chest pain, palpitations, orthopnea and claudication.  Gastrointestinal:  Negative for abdominal pain, blood in stool, constipation,  diarrhea, heartburn, melena, nausea and vomiting.  Genitourinary:  Negative for dysuria, flank pain, frequency, hematuria and urgency.  Musculoskeletal:  Negative for back pain, joint pain and myalgias.       Right leg redness and swelling  Skin:  Negative for rash.  Neurological:  Negative for dizziness, tingling, focal weakness, seizures, weakness and headaches.  Endo/Heme/Allergies:  Does not bruise/bleed easily.  Psychiatric/Behavioral:  Negative for depression and suicidal ideas. The patient does not have insomnia.       Allergies  Allergen Reactions   Corticosteroids Other (See Comments)    Pt trf from [name] and per primary md for her cancer tx. Notes that it causes agitation intolerance   Sulfa Antibiotics Other (See Comments)    Pt moved from [name] and in MD notes she has allergy but we do not know reactions when taking the drug   Celebrex [Celecoxib] Rash     Past Medical History:  Diagnosis Date   ADHD (attention deficit hyperactivity disorder)    in [place], no date on md note   Anemia    IDA 11/26/2019, Anemia in stage 4 chronic kidney disease ([place]) 09/03/2020   Arthritis    osteoarthritis right knee 09/30/2014   Breast cancer ([place]) 10/05/2013   in [place] +, [place] -, Her 2 is 3+   Cancer related pain 03/28/2021   in [name], md notes spine mets   DVT of lower extremity, bilateral ([place]) 03/30/2014   in [name]   Generalized muscle weakness 03/31/2016   in [name]   GERD (gastroesophageal reflux disease) 05/15/2013   per md in [name]   Hyperparathyroidism, secondary ([place]) 04/25/2018   in [name]   Hypertension 02/20/2016   info from MD in [facility]   Lumbar compression fracture ([place]) 03/18/2021   in [place] loss 02/05/2015   in [name]   Metabolic acidosis 91/50/5697   in [place]   Metabolic syndrome 94/80/1655   in [name]   Osteopenia after menopause 03/29/2016   in [place] of both eyes 09/30/2012   per md in [name] where pt. 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   in [name]   Stasis ulcer of left lower extremity ([place]) 03/29/2016   in [name]     Past Surgical History:  Procedure Laterality Date   CESAREAN SECTION     unknown   fibroid removed  N/A    in [place] - unknown date   IR FLUORO GUIDE CV LINE LEFT  07/27/2021   IR PORT REPAIR CENTRAL VENOUS ACCESS DEVICE Left    In [place] N/A 02/25/2014   in [name]   ovary removed      unknown   [place] CATARACT EXTRACAP,INSERT LENS Bilateral  Bilateral 09/05/2012   in [place]     unknown    LUMPECTOMY Right 04/03/2014   in [place]    Social History   Socioeconomic History   Marital status: Divorced    Spouse name: Not on file   Number of children: Not on file   Years of education: Not  on file   Highest education level: Not on file  Occupational History   Occupation: retired Teacher, music    Comment: In [place]  Tobacco Use   Smoking status: Never   Smokeless tobacco: Never  Vaping Use   Vaping Use: Never used  Substance and Sexual Activity   Alcohol use: Not Currently   Drug use: Never   Sexual activity: Not Currently  Other Topics Concern   Not on file  Social History Narrative   Not on file   Social Determinants of Health   Financial Resource Strain: Not on file  Food Insecurity: Not on file  Transportation Needs: Not on file  Physical Activity: Not on file  Stress: Not on file  Social Connections: Not on file  Intimate Partner Violence: Not on file    Family History  Problem Relation Age of Onset   Pancreatic cancer Mother    Stroke Father    Diabetes Father    Hypertension Father    Heart disease Father    Skin cancer Father    Varicose Veins Father    Skin cancer Brother    Cancer - Prostate Brother      Current Outpatient Medications:    amphetamine-dextroamphetamine (ADDERALL XR) 30 MG 24 hr capsule, Take 1 capsule (30 mg total) by mouth daily., Disp: 30 capsule, Rfl: 0   Calcium 200 MG TABS, Take 1 tablet  by mouth daily., Disp: , Rfl:    doxycycline (VIBRA-TABS) 100 MG tablet, Take 1 tablet (100 mg total) by mouth 2 (two) times daily for 7 days., Disp: 14 tablet, Rfl: 0   gabapentin (NEURONTIN) 400 MG capsule, Take 400 mg by mouth 2 (two) times daily., Disp: , Rfl:    lisinopril (ZESTRIL) 20 MG tablet, Take 20 mg by mouth daily., Disp: , Rfl:    Multiple Vitamin (MULTIVITAMIN ADULT PO), Take 1 tablet by mouth daily., Disp: , Rfl:    ondansetron (ZOFRAN) 8 MG tablet, Take 1 tablet (8 mg total) by mouth 2 (two) times daily as needed (Nausea or vomiting)., Disp: 30 tablet, Rfl: 1   Oxycodone HCl 10 MG TABS, Take 1 tablet (10 mg total) by mouth every 4 (four) hours as needed., Disp: 84 tablet, Rfl: 0   rivaroxaban (XARELTO) 20 MG TABS tablet, Take 1 tablet (20 mg total) by mouth daily with supper., Disp: 30 tablet, Rfl: 3   acetaminophen (TYLENOL) 500 MG tablet, Take 500 mg by mouth every 6 (six) hours as needed for mild pain ($RemoveBe'500mg'gygSGSjtr$  to $R'1000mg'sw$  Q6 hours PRN). (Patient not taking: No sig reported), Disp: , Rfl:    fentaNYL (DURAGESIC) 12 MCG/HR, Place 1 patch onto the skin every 3 (three) days. (Patient not taking: Reported on 08/12/2021), Disp: 10 patch, Rfl: 0   lidocaine-prilocaine (EMLA) cream, Apply to affected area once (Patient not taking: No sig reported), Disp: 30 g, Rfl: 3   ondansetron (ZOFRAN) 8 MG tablet, Take by mouth. (Patient not taking: Reported on 08/12/2021), Disp: , Rfl:    prochlorperazine (COMPAZINE) 10 MG tablet, Take 1 tablet (10 mg total) by mouth every 6 (six) hours as needed (Nausea or vomiting). (Patient not taking: No sig reported), Disp: 30 tablet, Rfl: 1   traMADol (ULTRAM) 50 MG tablet, Take 50 mg by mouth every 6 (six) hours as needed. (Patient not taking: Reported on 08/12/2021), Disp: , Rfl:    warfarin (COUMADIN) 2.5 MG tablet, Take by mouth. (Patient not taking: Reported on 08/12/2021), Disp: , Rfl:  zolpidem (AMBIEN) 5 MG tablet, Take 5 mg by mouth at bedtime as needed for  sleep. (Patient not taking: Reported on 08/12/2021), Disp: , Rfl:  No current facility-administered medications for this visit.  Facility-Administered Medications Ordered in Other Visits:    heparin lock flush 100 unit/mL, 500 Units, Intravenous, Once, [name], MD   heparin lock flush 100 unit/mL, 500 Units, Intracatheter, Once PRN, [name], MD   PACLitaxel (TAXOL) 114 mg in sodium chloride 0.9 % 250 mL chemo infusion (</= $RemoveBefor'80mg'PInosJzgVhfp$ /m2), 65 mg/m2 (Treatment Plan Recorded), Intravenous, Once, [name], MD, Last Rate: 269 mL/hr at 08/12/21 1213, 114 mg at 08/12/21 1213   sodium chloride flush (NS) 0.9 % injection 10 mL, 10 mL, Intravenous, Once, [name], MD  Physical exam:  Vitals:   08/12/21 0846  BP: 126/74  Pulse: 76  Resp: 18  Weight: 156 lb 9.6 oz (71 kg)   Physical Exam Constitutional:      General: She is not in acute distress. HENT:     Mouth/Throat:     Comments: No evidence of any acute gum swelling or overt dental infection noted. Cardiovascular:     Rate and Rhythm: Normal rate and regular rhythm.     Heart sounds: Normal heart sounds.  Pulmonary:     Effort: Pulmonary effort is normal.     Breath sounds: Normal breath sounds.  Abdominal:     General: Bowel sounds are normal.     Palpations: Abdomen is soft.  Musculoskeletal:     Comments: There is diffuse redness and subcutaneous edema noted over the right lower extremity.  Dorsalis pedis pulses palpable in the right lower extremity  Skin:    General: Skin is warm and dry.  Neurological:     Mental Status: She is alert and oriented to person, place, and time.     CMP Latest Ref Rng & Units 08/12/2021  Glucose 70 - 99 mg/dL 107(H)  BUN 8 - 23 mg/dL 19  Creatinine 0.44 - 1.00 mg/dL 1.12(H)  Sodium 135 - 145 mmol/L 137  Potassium 3.5 - 5.1 mmol/L 4.1  Chloride 98 - 111 mmol/L 104  CO2 22 - 32 mmol/L 25  Calcium 8.9 - 10.3 mg/dL 8.6(L)  Total Protein 6.5 - 8.1 g/dL 6.4(L)  Total Bilirubin 0.3  - 1.2 mg/dL 0.3  Alkaline Phos 38 - 126 U/L 61  AST 15 - 41 U/L 22  ALT 0 - 44 U/L 14   CBC Latest Ref Rng & Units 08/12/2021  WBC 4.0 - 10.5 K/uL 4.4  Hemoglobin 12.0 - 15.0 g/dL 8.3(L)  Hematocrit 36.0 - 46.0 % 27.2(L)  Platelets 150 - 400 K/uL 264        CT Abdomen Pelvis Wo Contrast  Result Date: 08/04/2021 CLINICAL DATA:  Breast cancer staging EXAM: CT ABDOMEN AND PELVIS WITHOUT CONTRAST TECHNIQUE: Multidetector CT imaging of the abdomen and pelvis was performed following the standard protocol without IV contrast. Oral enteric contrast was administered. COMPARISON:  None. FINDINGS: Lower chest: No acute abnormality. Hepatobiliary: No solid liver abnormality is seen. No gallstones, gallbladder wall thickening, or biliary dilatation. Pancreas: Unremarkable. No pancreatic ductal dilatation or surrounding inflammatory changes. Spleen: Normal in size without significant abnormality. Adrenals/Urinary Tract: Adrenal glands are unremarkable. Kidneys are normal, without renal calculi, solid lesion, or hydronephrosis. Bladder is unremarkable. Stomach/Bowel: Stomach is within normal limits. Appendix appears normal. No evidence of bowel wall thickening, distention, or inflammatory changes. Large burden of stool throughout the colon and  rectum. Vascular/Lymphatic: Aortic atherosclerosis. Multiple enlarged bilateral iliac, inguinal, and pelvic sidewall lymph nodes, largest left inguinal nodes measuring up to 2.5 x 1.5 cm (series 2, image 81). Prominent, although not pathologically enlarged lymph nodes in the upper abdomen. Reproductive: No mass or other significant abnormality. Other: No abdominal wall hernia or abnormality. No abdominopelvic ascites. Musculoskeletal: Numerous sclerotic osseous lesions throughout the included skeleton, particularly of T12 through L2 vertebral bodies. IMPRESSION: 1. Multiple enlarged bilateral iliac, inguinal, and pelvic sidewall lymph nodes. Prominent, although not  pathologically enlarged lymph nodes in the upper abdomen. Findings are highly concerning for nodal metastatic disease in the setting of known metastatic breast cancer, lymphoma an alternate differential consideration. 2. No obvious evidence of solid organ metastatic disease in the abdomen or pelvis, assessment significantly limited by noncontrast CT. 3. Numerous sclerotic osseous metastatic lesions throughout the included skeleton. Aortic Atherosclerosis (ICD10-I70.0). Electronically Signed   By: Lauralyn Primes M.D.   On: 08/04/2021 16:14   NM Bone Scan Whole Body  Result Date: 08/05/2021 CLINICAL DATA:  Breast cancer, surveillance breast cancer. Bilateral arm and shoulder pain. EXAM: NUCLEAR MEDICINE WHOLE BODY BONE SCAN TECHNIQUE: Whole body anterior and posterior images were obtained approximately 3 hours after intravenous injection of radiopharmaceutical. RADIOPHARMACEUTICALS:  21.24 mCi Technetium-36m MDP IV COMPARISON:  None. Findings are correlated with CT examination of the abdomen pelvis performed at 8:30 a.m. FINDINGS: There is focal uptake of radiotracer within the frontal bone, cervical spine, proximal right humerus, medial right clavicle, left glenoid, sternum, thoracolumbar spine particularly T12 and L1, and the right superior pubic ramus in keeping with osseous metastatic disease. More curvilinear uptake within the right humeral head anteriorly is more suggestive of degenerative change involving the glenohumeral articulation, however, metastatic disease is not excluded. Uptake within the right knee and foot is likely degenerative in nature. Normal soft tissue distribution. Normal uptake and excretion within the kidneys. IMPRESSION: Widespread osseous metastatic disease. Dedicated radiographs of the cervical spine and right humerus is recommended to assess the degree of osseous involvement assess for potential pathologic fracture. CT imaging of the chest may be helpful for both confirmation of  metastatic disease as well as response assessment purposes. Electronically Signed   By: Helyn Numbers M.D.   On: 08/05/2021 00:06   IR Fluoro Guide CV Line Left  Result Date: 07/27/2021 INDICATION: Breast cancer, access for chemotherapy, poor functioning port catheter EXAM: FLUOROSCOPIC LEFT IJ POWER PORT CATHETER INJECTION MEDICATIONS: 20 cc omni 300 ANESTHESIA/SEDATION: None. FLUOROSCOPY TIME:  Fluoroscopy Time: 0 minutes 30 seconds (2.7 mGy). COMPLICATIONS: None immediate. PROCEDURE: Informed written consent was obtained from the patient after a thorough discussion of the procedural risks, benefits and alternatives. All questions were addressed. Maximal Sterile Barrier Technique was utilized including caps, mask, sterile gowns, sterile gloves, sterile drape, hand hygiene and skin antiseptic. A timeout was performed prior to the initiation of the procedure. Under sterile conditions, the left IJ power port catheter was accessed. Blood aspirated easily followed by saline flush. Contrast injection performed for port catheter injection. Port catheter tubing is intact. Tip is in the upper SVC. Contrast does flow freely from the port catheter tip. No fibrin sheath. No discontinuity, obstruction or leakage. Port catheter flushed again with saline followed by heparin flush. Needle removed. IMPRESSION: Patent left IJ power port catheter. Tip is short in the proximal SVC. Negative for fibrin sheath. Electronically Signed   By: Judie Petit.  Shick M.D.   On: 07/27/2021 12:34   ECHOCARDIOGRAM COMPLETE  Result Date: 08/01/2021  ECHOCARDIOGRAM REPORT   Patient Name:   Laura Santiago Date of Exam: 08/01/2021 Medical Rec #:  [id number]        Height:       64.0 in Accession #:    [id number]       Weight:       160.8 lb Date of Birth:  1946-08-17        BSA:          1.783 m Patient Age:    20 years         BP:           172/84 mmHg Patient Gender: F                HR:           95 bpm. Exam Location:  [facility] Procedure: 2D Echo,  Color Doppler, Cardiac Doppler and Strain Analysis Indications:     Z09 Chemo  History:         Patient has no prior history of Echocardiogram examinations.                  Risk Factors:Hypertension. Metastic breast cancer; Bone                  metastases; Abnormal ECG.  Sonographer:     Charmayne Sheer Referring Phys:  [id number] [place] Anna Shastina Rua Diagnosing Phys: Kathlyn Sacramento MD  Sonographer Comments: Suboptimal subcostal window. Global longitudinal strain was attempted. IMPRESSIONS  1. Left ventricular ejection fraction, by estimation, is 60 to 65%. The left ventricle has normal function. The left ventricle has no regional wall motion abnormalities. There is mild left ventricular hypertrophy. Left ventricular diastolic parameters were normal. The average left ventricular global longitudinal strain is -17.7 %. The global longitudinal strain is normal.  2. Right ventricular systolic function is normal. The right ventricular size is normal. Tricuspid regurgitation signal is inadequate for assessing PA pressure.  3. Left atrial size was mildly dilated.  4. The mitral valve is normal in structure. Mild mitral valve regurgitation. No evidence of mitral stenosis.  5. The aortic valve is normal in structure. Aortic valve regurgitation is not visualized. Mild to moderate aortic valve sclerosis/calcification is present, without any evidence of aortic stenosis.  6. The inferior vena cava is normal in size with <50% respiratory variability, suggesting right atrial pressure of 8 mmHg. FINDINGS  Left Ventricle: Left ventricular ejection fraction, by estimation, is 60 to 65%. The left ventricle has normal function. The left ventricle has no regional wall motion abnormalities. The average left ventricular global longitudinal strain is -17.7 %. The global longitudinal strain is normal. The left ventricular internal cavity size was normal in size. There is mild left ventricular hypertrophy. Left ventricular diastolic parameters were  normal. Right Ventricle: The right ventricular size is normal. No increase in right ventricular wall thickness. Right ventricular systolic function is normal. Tricuspid regurgitation signal is inadequate for assessing PA pressure. Left Atrium: Left atrial size was mildly dilated. Right Atrium: Right atrial size was normal in size. Pericardium: There is no evidence of pericardial effusion. Mitral Valve: The mitral valve is normal in structure. Mild mitral valve regurgitation. No evidence of mitral valve stenosis. MV peak gradient, 7.3 mmHg. The mean mitral valve gradient is 3.0 mmHg. Tricuspid Valve: The tricuspid valve is normal in structure. Tricuspid valve regurgitation is trivial. No evidence of tricuspid stenosis. Aortic Valve: The aortic valve is normal in structure. Aortic valve regurgitation is not visualized. Mild to  moderate aortic valve sclerosis/calcification is present, without any evidence of aortic stenosis. Aortic valve mean gradient measures 8.0 mmHg. Aortic valve peak gradient measures 14.7 mmHg. Aortic valve area, by VTI measures 2.49 cm. Pulmonic Valve: The pulmonic valve was normal in structure. Pulmonic valve regurgitation is not visualized. No evidence of pulmonic stenosis. Aorta: The aortic root is normal in size and structure. Venous: The inferior vena cava is normal in size with less than 50% respiratory variability, suggesting right atrial pressure of 8 mmHg. IAS/Shunts: No atrial level shunt detected by color flow Doppler.  LEFT VENTRICLE PLAX 2D LVIDd:         3.80 cm  Diastology LVIDs:         2.30 cm  LV e' medial:    8.05 cm/s LV PW:         1.10 cm  LV E/e' medial:  11.8 LV IVS:        1.10 cm  LV e' lateral:   11.20 cm/s LVOT diam:     2.10 cm  LV E/e' lateral: 8.5 LV SV:         90 LV SV Index:   51       2D Longitudinal Strain LVOT Area:     3.46 cm 2D Strain GLS Avg:     -17.7 %  RIGHT VENTRICLE RV Basal diam:  3.00 cm TAPSE (M-mode): 2.7 cm LEFT ATRIUM             Index        RIGHT ATRIUM           Index LA diam:        4.40 cm 2.47 cm/m  RA Area:     13.00 cm LA Vol (A2C):   84.5 ml 47.39 ml/m RA Volume:   30.10 ml  16.88 ml/m LA Vol (A4C):   45.4 ml 25.46 ml/m LA Biplane Vol: 65.4 ml 36.68 ml/m  AORTIC VALVE                    PULMONIC VALVE AV Area (Vmax):    2.55 cm     PV Vmax:       1.44 m/s AV Area (Vmean):   2.36 cm     PV Vmean:      103.000 cm/s AV Area (VTI):     2.49 cm     PV VTI:        0.276 m AV Vmax:           191.50 cm/s  PV Peak grad:  8.3 mmHg AV Vmean:          134.000 cm/s PV Mean grad:  5.0 mmHg AV VTI:            0.362 m AV Peak Grad:      14.7 mmHg AV Mean Grad:      8.0 mmHg LVOT Vmax:         141.00 cm/s LVOT Vmean:        91.300 cm/s LVOT VTI:          0.260 m LVOT/AV VTI ratio: 0.72  AORTA Ao Root diam: 3.10 cm MITRAL VALVE MV Area (PHT): 5.42 cm    SHUNTS MV Area VTI:   3.07 cm    Systemic VTI:  0.26 m MV Peak grad:  7.3 mmHg    Systemic Diam: 2.10 cm MV Mean grad:  3.0 mmHg MV Vmax:       1.35 m/s MV  Vmean:      87.3 cm/s MV Decel Time: 140 msec MV E velocity: 95.10 cm/s MV A velocity: 97.70 cm/s MV E/A ratio:  0.97 Kathlyn Sacramento MD Electronically signed by Kathlyn Sacramento MD Signature Date/Time: 08/01/2021/4:33:57 PM    Final      Assessment and plan- Patient is a 75 y.o. female with ER weakly +10%, PR negative and HER2 positive breast cancer diagnosed with 2014 now with multiple areas of metastatic disease.  She is here for on treatment assessment prior to cycle 2-day 15 of Taxol chemotherapy  Counts okay to proceed with cycle 2-day 15 of Taxol chemotherapy today.  She will directly proceed for Taxol Herceptin and Perjeta in 1 week and I will see her back in 2 weeks for Taxol alone.  She will be seen by covering NP in 1 week  Right lower extremity cellulitis: I will give her a course of doxycycline 100 mg twice daily for 7 days.  If her leg does not appear better by next week I will refer her to infectious diseases.  She is currently on  Xarelto for chronic lower extremity DVT and this appears more like cellulitis than a recurrent DVT.  We will consider repeat Doppler next week if symptoms do not improve as well.  Discussed the results ofCT abdomen and bone scan which shows multiple enlarged bilateral iliac inguinal and pelvic sidewall lymph nodes along with numerous areas of skeletal metastases.  However this was not compared to her prior scan which was done in [name] and patient had a CT chest abdomen and pelvis in April 2022 which had shown similar findings and therefore it is unclear of these lymph nodes and bone metastases are stable better or worse.  We will try to get the images from [name] sent to our system for comparison and also has the patient to get hold of medical records there and get a disc for these prior scans.  For now my plan is to continue Taxol Herceptin and Perjeta with a repeat scan in November 2022.  Bone mets: We will give her Zometa at 3.3 mg today.  Plan to do that once a month.   Visit Diagnosis 1. Metastatic breast cancer ([place])   2. Encounter for antineoplastic chemotherapy   3. Current use of long term anticoagulation   4. Cellulitis of right lower extremity   5. Encounter for monitoring zoledronic acid therapy      Dr. Randa Evens, MD, MPH [facility] [phone] 08/12/2021 12:50 PM

## 2021-08-12 NOTE — Telephone Encounter (Signed)
Trying to find the pt a dentist. Most offices are closed on Fridays, re-opening on Tuesday. [place] and they do not accept pts insurance. Was told to call [phone] on Tuesday and ask.

## 2021-08-12 NOTE — Telephone Encounter (Signed)
Spoke to [place] in the film room at [employer]. Informed me to fax in a official request for PET scan images along with an order and they will get started on request right away. Faxed request to [phone] with fax confirmation.

## 2021-08-12 NOTE — Patient Instructions (Signed)
[facility]  Discharge Instructions: Thank you for choosing [facility] to provide your oncology and hematology care.  If you have a lab appointment with the Cancer Center, please go directly to the Cancer Center and check in at the registration area.  Wear comfortable clothing and clothing appropriate for easy access to any Portacath or PICC line.   We strive to give you quality time with your provider. You may need to reschedule your appointment if you arrive late (15 or more minutes).  Arriving late affects you and other patients whose appointments are after yours.  Also, if you miss three or more appointments without notifying the office, you may be dismissed from the clinic at the provider's discretion.      For prescription refill requests, have your pharmacy contact our office and allow 72 hours for refills to be completed.    Today you received the following chemotherapy and/or immunotherapy agents       To help prevent nausea and vomiting after your treatment, we encourage you to take your nausea medication as directed.  BELOW ARE SYMPTOMS THAT SHOULD BE REPORTED IMMEDIATELY: *FEVER GREATER THAN 100.4 F (38 C) OR HIGHER *CHILLS OR SWEATING *NAUSEA AND VOMITING THAT IS NOT CONTROLLED WITH YOUR NAUSEA MEDICATION *UNUSUAL SHORTNESS OF BREATH *UNUSUAL BRUISING OR BLEEDING *URINARY PROBLEMS (pain or burning when urinating, or frequent urination) *BOWEL PROBLEMS (unusual diarrhea, constipation, pain near the anus) TENDERNESS IN MOUTH AND THROAT WITH OR WITHOUT PRESENCE OF ULCERS (sore throat, sores in mouth, or a toothache) UNUSUAL RASH, SWELLING OR PAIN  UNUSUAL VAGINAL DISCHARGE OR ITCHING   Items with * indicate a potential emergency and should be followed up as soon as possible or go to the Emergency Department if any problems should occur.  Please show the CHEMOTHERAPY ALERT CARD or IMMUNOTHERAPY ALERT CARD at check-in to the  Emergency Department and triage nurse.  Should you have questions after your visit or need to cancel or reschedule your appointment, please contact [facility]  [phone] and follow the prompts.  Office hours are 8:00 a.m. to 4:30 p.m. Monday - Friday. Please note that voicemails left after 4:00 p.m. may not be returned until the following business day.  We are closed weekends and major holidays. You have access to a nurse at all times for urgent questions. Please call the main number to the clinic [phone] and follow the prompts.  For any non-urgent questions, you may also contact your provider using MyChart. We now offer e-Visits for anyone 18 and older to request care online for non-urgent symptoms. For details visit [email or web address].   Also download the MyChart app! Go to the app store, search "MyChart", open the app, select , and log in with your MyChart username and password.  Due to Covid, a mask is required upon entering the hospital/clinic. If you do not have a mask, one will be given to you upon arrival. For doctor visits, patients may have 1 support person aged 18 or older with them. For treatment visits, patients cannot have anyone with them due to current Covid guidelines and our immunocompromised population.  

## 2021-08-12 NOTE — Progress Notes (Signed)
Pt states her left leg is looking worse along with her toes swelling will come and go states it is painful. As well as, gripping things; drops everything, deals with fatigue.

## 2021-08-14 ENCOUNTER — Other Ambulatory Visit: Payer: Self-pay | Admitting: Oncology

## 2021-08-16 ENCOUNTER — Ambulatory Visit
Admission: RE | Admit: 2021-08-16 | Discharge: 2021-08-16 | Disposition: A | Discharge status: Home / Self Care | Payer: Self-pay | Source: Ambulatory Visit | Attending: Oncology | Admitting: Oncology

## 2021-08-16 ENCOUNTER — Telehealth: Payer: Self-pay

## 2021-08-16 ENCOUNTER — Other Ambulatory Visit: Payer: Self-pay

## 2021-08-16 DIAGNOSIS — C50919 Malignant neoplasm of unspecified site of unspecified female breast: Secondary | ICD-10-CM

## 2021-08-17 ENCOUNTER — Ambulatory Visit: Payer: Medicare Other | Admitting: Internal Medicine

## 2021-08-18 ENCOUNTER — Telehealth: Payer: Self-pay | Admitting: *Deleted

## 2021-08-18 NOTE — Telephone Encounter (Signed)
Daughter [name] called stating hat Cresenciano Lick said that our office will have to request records on this patient. She is also requesting that Dr Janese Banks call her to discuss patient treatment plan.

## 2021-08-19 ENCOUNTER — Encounter: Payer: Self-pay | Admitting: Oncology

## 2021-08-19 ENCOUNTER — Inpatient Hospital Stay: Payer: Medicare Other

## 2021-08-19 ENCOUNTER — Ambulatory Visit: Payer: Medicare Other

## 2021-08-19 ENCOUNTER — Inpatient Hospital Stay (HOSPITAL_BASED_OUTPATIENT_CLINIC_OR_DEPARTMENT_OTHER): Payer: Medicare Other | Admitting: Hospice and Palliative Medicine

## 2021-08-19 ENCOUNTER — Other Ambulatory Visit: Payer: Medicare Other

## 2021-08-19 ENCOUNTER — Other Ambulatory Visit: Payer: Self-pay | Admitting: Oncology

## 2021-08-19 VITALS — BP 139/64 | HR 75 | Temp 98.6°F | Resp 20 | Wt 156.0 lb

## 2021-08-19 DIAGNOSIS — Z5112 Encounter for antineoplastic immunotherapy: Secondary | ICD-10-CM | POA: Diagnosis not present

## 2021-08-19 DIAGNOSIS — C50919 Malignant neoplasm of unspecified site of unspecified female breast: Secondary | ICD-10-CM

## 2021-08-19 LAB — CBC WITH DIFFERENTIAL/PLATELET
Abs Immature Granulocytes: 0.06 10*3/uL (ref 0.00–0.07)
Basophils Absolute: 0 10*3/uL (ref 0.0–0.1)
Basophils Relative: 1 %
Eosinophils Absolute: 0.1 10*3/uL (ref 0.0–0.5)
Eosinophils Relative: 1 %
HCT: 29.6 % — ABNORMAL LOW (ref 36.0–46.0)
Hemoglobin: 9.2 g/dL — ABNORMAL LOW (ref 12.0–15.0)
Immature Granulocytes: 1 %
Lymphocytes Relative: 11 %
Lymphs Abs: 0.6 10*3/uL — ABNORMAL LOW (ref 0.7–4.0)
MCH: 29.8 pg (ref 26.0–34.0)
MCHC: 31.1 g/dL (ref 30.0–36.0)
MCV: 95.8 fL (ref 80.0–100.0)
Monocytes Absolute: 0.4 10*3/uL (ref 0.1–1.0)
Monocytes Relative: 7 %
Neutro Abs: 4.2 10*3/uL (ref 1.7–7.7)
Neutrophils Relative %: 79 %
Platelets: 323 10*3/uL (ref 150–400)
RBC: 3.09 MIL/uL — ABNORMAL LOW (ref 3.87–5.11)
RDW: 16.3 % — ABNORMAL HIGH (ref 11.5–15.5)
WBC: 5.3 10*3/uL (ref 4.0–10.5)
nRBC: 0 % (ref 0.0–0.2)

## 2021-08-19 LAB — COMPREHENSIVE METABOLIC PANEL
ALT: 11 U/L (ref 0–44)
AST: 18 U/L (ref 15–41)
Albumin: 3.5 g/dL (ref 3.5–5.0)
Alkaline Phosphatase: 66 U/L (ref 38–126)
Anion gap: 6 (ref 5–15)
BUN: 18 mg/dL (ref 8–23)
CO2: 24 mmol/L (ref 22–32)
Calcium: 8.7 mg/dL — ABNORMAL LOW (ref 8.9–10.3)
Chloride: 108 mmol/L (ref 98–111)
Creatinine, Ser: 1.01 mg/dL — ABNORMAL HIGH (ref 0.44–1.00)
GFR, Estimated: 58 mL/min — ABNORMAL LOW (ref >60)
Glucose, Bld: 116 mg/dL — ABNORMAL HIGH (ref 70–99)
Potassium: 4.5 mmol/L (ref 3.5–5.1)
Sodium: 138 mmol/L (ref 135–145)
Total Bilirubin: 0.5 mg/dL (ref 0.3–1.2)
Total Protein: 6.6 g/dL (ref 6.5–8.1)

## 2021-08-19 MED ORDER — SODIUM CHLORIDE 0.9 % IV SOLN
6.0000 mg/kg | Freq: Once | INTRAVENOUS | Status: AC
  Administered 2021-08-19: 420 mg via INTRAVENOUS
  Filled 2021-08-19: qty 20

## 2021-08-19 MED ORDER — SODIUM CHLORIDE 0.9 % IV SOLN
Freq: Once | INTRAVENOUS | Status: AC
Start: 2021-08-19 — End: 2021-08-19
  Filled 2021-08-19: qty 250

## 2021-08-19 MED ORDER — SODIUM CHLORIDE 0.9 % IV SOLN
10.0000 mg | Freq: Once | INTRAVENOUS | Status: AC
  Administered 2021-08-19: 10 mg via INTRAVENOUS
  Filled 2021-08-19: qty 10

## 2021-08-19 MED ORDER — HEPARIN SOD (PORK) LOCK FLUSH 100 UNIT/ML IV SOLN
500.0000 [IU] | Freq: Once | INTRAVENOUS | Status: AC | PRN
  Filled 2021-08-19: qty 5

## 2021-08-19 MED ORDER — SODIUM CHLORIDE 0.9% FLUSH
10.0000 mL | INTRAVENOUS | Status: DC | PRN
  Filled 2021-08-19: qty 10

## 2021-08-19 MED ORDER — SODIUM CHLORIDE 0.9 % IV SOLN
65.0000 mg/m2 | Freq: Once | INTRAVENOUS | Status: AC
  Administered 2021-08-19: 114 mg via INTRAVENOUS
  Filled 2021-08-19: qty 19

## 2021-08-19 MED ORDER — DIPHENHYDRAMINE HCL 50 MG/ML IJ SOLN
50.0000 mg | Freq: Once | INTRAMUSCULAR | Status: AC
  Administered 2021-08-19: 50 mg via INTRAVENOUS
  Filled 2021-08-19: qty 1

## 2021-08-19 MED ORDER — SODIUM CHLORIDE 0.9 % IV SOLN
420.0000 mg | Freq: Once | INTRAVENOUS | Status: AC
  Administered 2021-08-19: 420 mg via INTRAVENOUS
  Filled 2021-08-19: qty 14

## 2021-08-19 MED ORDER — HEPARIN SOD (PORK) LOCK FLUSH 100 UNIT/ML IV SOLN
500.0000 [IU] | Freq: Once | INTRAVENOUS | Status: AC
  Filled 2021-08-19: qty 5

## 2021-08-19 MED ORDER — FAMOTIDINE 20 MG IN NS 100 ML IVPB
20.0000 mg | Freq: Once | INTRAVENOUS | Status: AC
  Administered 2021-08-19: 20 mg via INTRAVENOUS
  Filled 2021-08-19: qty 20

## 2021-08-19 MED ORDER — ACETAMINOPHEN 325 MG PO TABS
650.0000 mg | ORAL_TABLET | Freq: Once | ORAL | Status: AC
  Administered 2021-08-19: 650 mg via ORAL
  Filled 2021-08-19: qty 2

## 2021-08-19 MED ORDER — HEPARIN SOD (PORK) LOCK FLUSH 100 UNIT/ML IV SOLN
INTRAVENOUS | Status: AC
  Administered 2021-08-19: 500 [IU]
  Filled 2021-08-19: qty 5

## 2021-08-19 NOTE — Patient Instructions (Signed)
[facility]  Discharge Instructions: Thank you for choosing [facility] to provide your oncology and hematology care.  If you have a lab appointment with the [facility], please go directly to the [facility] and check in at the registration area.  Wear comfortable clothing and clothing appropriate for easy access to any Portacath or PICC line.   We strive to give you quality time with your provider. You may need to reschedule your appointment if you arrive late (15 or more minutes).  Arriving late affects you and other patients whose appointments are after yours.  Also, if you miss three or more appointments without notifying the office, you may be dismissed from the clinic at the provider's discretion.      For prescription refill requests, have your pharmacy contact our office and allow 72 hours for refills to be completed.    Today you received the following chemotherapy and/or immunotherapy agents Ogivri, Perjeta, & Taxol      To help prevent nausea and vomiting after your treatment, we encourage you to take your nausea medication as directed.  BELOW ARE SYMPTOMS THAT SHOULD BE REPORTED IMMEDIATELY: *FEVER GREATER THAN 100.4 F (38 C) OR HIGHER *CHILLS OR SWEATING *NAUSEA AND VOMITING THAT IS NOT CONTROLLED WITH YOUR NAUSEA MEDICATION *UNUSUAL SHORTNESS OF BREATH *UNUSUAL BRUISING OR BLEEDING *URINARY PROBLEMS (pain or burning when urinating, or frequent urination) *BOWEL PROBLEMS (unusual diarrhea, constipation, pain near the anus) TENDERNESS IN MOUTH AND THROAT WITH OR WITHOUT PRESENCE OF ULCERS (sore throat, sores in mouth, or a toothache) UNUSUAL RASH, SWELLING OR PAIN  UNUSUAL VAGINAL DISCHARGE OR ITCHING   Items with * indicate a potential emergency and should be followed up as soon as possible or go to the Emergency Department if any problems should occur.  Please show the CHEMOTHERAPY ALERT CARD or IMMUNOTHERAPY ALERT  CARD at check-in to the Emergency Department and triage nurse.  Should you have questions after your visit or need to cancel or reschedule your appointment, please contact [facility]  [phone] and follow the prompts.  Office hours are 8:00 a.m. to 4:30 p.m. Monday - Friday. Please note that voicemails left after 4:00 p.m. may not be returned until the following business day.  We are closed weekends and major holidays. You have access to a nurse at all times for urgent questions. Please call the main number to the clinic [phone] and follow the prompts.  For any non-urgent questions, you may also contact your provider using MyChart. We now offer e-Visits for anyone 77 and older to request care online for non-urgent symptoms. For details visit [email or web address].   Also download the MyChart app! Go to the app store, search "MyChart", open the app, select [facility], and log in with your MyChart username and password.  Due to Covid, a mask is required upon entering the hospital/clinic. If you do not have a mask, one will be given to you upon arrival. For doctor visits, patients may have 1 support person aged 77 or older with them. For treatment visits, patients cannot have anyone with them due to current Covid guidelines and our immunocompromised population.

## 2021-08-19 NOTE — Progress Notes (Signed)
No blood return noted from port. Flushes well with no edema or pain. Dr. Janese Banks made aware and okay to proceed.

## 2021-08-19 NOTE — Progress Notes (Signed)
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  She had a screening mammogram in September 2014 which showed 2.1 x 2.3 x 1.8 cm irregular mass in her right breast.  It was ER 95% positive PR negative and HER2 positive +3.  She received neoadjuvant chemotherapy with Taxol Herceptin and Perjeta for 4 cycles followed by dose dense AC/Herceptin x4 which she completed in March 2015.  She had a right lumpectomy on 04/03/2014 which showed scant residual invasive ductal carcinoma [id number].  She completed 1 year of adjuvant Herceptin chemotherapy and also completed adjuvant radiation treatment.  She was recommended anastrozole which she took on and off starting November 2015 and stopped sometime in 2020.   She was then hospitalized with neck pain and was found to have lytic lesions involving C5-C6 with pathological vertebral fractures.  She underwent radiation treatment to this area.  Image guided biopsy of the L1 vertebral body showed metastatic carcinoma consistent with breast origin ER 10% PR 0% and HER2 amplified ratio 5.1 average HER2 signal number per cell 15.0 average CEP 17 signals number per cell 3.0.  Baseline echocardiogram on 05/02/2021 showed a normal EF of 62% she was recommended Taxol Herceptin and Perjeta which she received for 2 cycles at [facility] until June 10, 2021   She  has chronic pain from her bone metastases for which she is currently on oxycodone 10 mg every 4 hours as needed and 12 mcg fentanyl patch.  She was seeing pain clinic when she was living in [place].   Patient has also been on Zometa when she was in [place] but she does have some ongoing dental issues.  She has received Xgeva in the past as well.  Her last Rivka Barbara was in July 2021.  Last PET scan was on 03/21/2021 which showed diffuse osseous metastatic disease involving the head neck chest abdomen and pelvis and spine.  Left lung apex hypermetabolic nodule and multiple hypermetabolic liver lesions concerning for disease involvement.  Enlarged hypermetabolic left inguinal lymph nodes along with hypermetabolic external iliac and left supraclavicular lymph nodes   Patient is now moved to [place] to be close to her daughter.  She lives in an independent living.   Interval history-patient denies significant changes today but has multiple concerns about obtaining imaging from [place] and understanding her cancer and prognosis.  Patient continues to have right lower extremity edema and redness and reports that some days are better than others.  She denies significant pain but has some soreness to the calf.  She has several ulcers along the toes of the right foot.  She is currently on course of doxycycline.  Patient denies fever or chills.   ECOG PS- 2 Pain scale- 3 Opioid associated constipation- no  Review of systems- Review of Systems  Constitutional:  Positive for malaise/fatigue. Negative for chills, fever and weight loss.  HENT:  Negative for congestion, ear discharge and nosebleeds.  Eyes:  Negative for blurred vision.  Respiratory:  Negative for cough, hemoptysis, sputum production, shortness of breath and wheezing.   Cardiovascular:  Negative for chest pain, palpitations, orthopnea and claudication.  Gastrointestinal:  Negative for abdominal pain, blood in stool, constipation, diarrhea, heartburn,  melena, nausea and vomiting.  Genitourinary:  Negative for dysuria, flank pain, frequency, hematuria and urgency.  Musculoskeletal:  Negative for back pain, joint pain and myalgias.       Right leg redness and swelling  Skin:  Positive for rash.       Redness and swelling to the right lower extremity.  Patient has chronic wounds to left lower extremity.  Neurological:  Negative for dizziness, tingling, focal weakness, seizures, weakness and headaches.  Endo/Heme/Allergies:  Does not bruise/bleed easily.  Psychiatric/Behavioral:  Negative for depression and suicidal ideas. The patient does not have insomnia.       Allergies  Allergen Reactions   Corticosteroids Other (See Comments)    Pt trf from [place] and per primary md for her cancer tx. Notes that it causes agitation intolerance   Sulfa Antibiotics Other (See Comments)    Pt moved from [place] and in MD notes she has allergy but we do not know reactions when taking the drug   Celebrex [Celecoxib] Rash     Past Medical History:  Diagnosis Date   ADHD (attention deficit hyperactivity disorder)    in [place], no date on md note   Anemia    IDA 11/26/2019, Anemia in stage 4 chronic kidney disease (HCC) 09/03/2020   Arthritis    osteoarthritis right knee 09/30/2014   Breast cancer (HCC) 10/05/2013   in [place] +, [place] -, Her 2 is 3+   Cancer related pain 03/28/2021   in [place], md notes spine mets   DVT of lower extremity, bilateral (HCC) 03/30/2014   in [place]   Generalized muscle weakness 03/31/2016   in [place]   GERD (gastroesophageal reflux disease) 05/15/2013   per md in [place]   Hyperparathyroidism, secondary (HCC) 04/25/2018   in [place]   Hypertension 02/20/2016   info from MD in [place]   Lumbar compression fracture (HCC) 03/18/2021   in [place]   Memory loss 02/05/2015   in [place]   Metabolic acidosis 01/27/2018   in [place]   Metabolic syndrome 02/20/2016   in [place]   Osteopenia after menopause 03/29/2016   in [place]   Pseudophakia of both  eyes 09/30/2012   per md in [place] where pt. 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   in [place]   Stasis ulcer of left lower extremity (HCC) 03/29/2016   in [place]     Past Surgical History:  Procedure Laterality Date   CESAREAN SECTION     unknown   fibroid removed  N/A    in [place] - unknown date   IR FLUORO GUIDE CV LINE LEFT  07/27/2021   IR PORT REPAIR CENTRAL VENOUS ACCESS DEVICE Left    In [place]   MOHS SURGERY N/A 02/25/2014   in [place]   ovary removed      unknown   REMV CATARACT EXTRACAP,INSERT LENS Bilateral  Bilateral 09/05/2012   in [place]   TONSILLECTOMY     unknown    LUMPECTOMY Right 04/03/2014   in [place]    Social History   Socioeconomic History   Marital status: Divorced    Spouse name: Not on file   Number of children: Not on file  Years of education: Not on file   Highest education level: Not on file  Occupational History   Occupation: retired Teacher, music    Comment: In [place]  Tobacco Use   Smoking status: Never   Smokeless tobacco: Never  Vaping Use   Vaping Use: Never used  Substance and Sexual Activity   Alcohol use: Not Currently   Drug use: Never   Sexual activity: Not Currently  Other Topics Concern   Not on file  Social History Narrative   Not on file   Social Determinants of Health   Financial Resource Strain: Not on file  Food Insecurity: Not on file  Transportation Needs: Not on file  Physical Activity: Not on file  Stress: Not on file  Social Connections: Not on file  Intimate Partner Violence: Not on file    Family History  Problem Relation Age of Onset   Pancreatic cancer Mother    Stroke Father    Diabetes Father    Hypertension Father    Heart disease Father    Skin cancer Father    Varicose Veins Father    Skin cancer Brother    Cancer - Prostate Brother      Current Outpatient Medications:    amphetamine-dextroamphetamine (ADDERALL XR) 30 MG 24 hr capsule, Take 1 capsule (30  mg total) by mouth daily., Disp: 30 capsule, Rfl: 0   Calcium 200 MG TABS, Take 1 tablet by mouth daily., Disp: , Rfl:    doxycycline (VIBRA-TABS) 100 MG tablet, Take 1 tablet (100 mg total) by mouth 2 (two) times daily for 7 days., Disp: 14 tablet, Rfl: 0   fentaNYL (DURAGESIC) 12 MCG/HR, Place 1 patch onto the skin every 3 (three) days., Disp: 10 patch, Rfl: 0   gabapentin (NEURONTIN) 400 MG capsule, Take 400 mg by mouth 2 (two) times daily., Disp: , Rfl:    lidocaine-prilocaine (EMLA) cream, Apply to affected area once, Disp: 30 g, Rfl: 3   lisinopril (ZESTRIL) 20 MG tablet, Take 20 mg by mouth daily., Disp: , Rfl:    Multiple Vitamin (MULTIVITAMIN ADULT PO), Take 1 tablet by mouth daily., Disp: , Rfl:    ondansetron (ZOFRAN) 8 MG tablet, Take 1 tablet (8 mg total) by mouth 2 (two) times daily as needed (Nausea or vomiting)., Disp: 30 tablet, Rfl: 1   ondansetron (ZOFRAN) 8 MG tablet, Take by mouth., Disp: , Rfl:    Oxycodone HCl 10 MG TABS, Take 1 tablet (10 mg total) by mouth every 4 (four) hours as needed., Disp: 84 tablet, Rfl: 0   rivaroxaban (XARELTO) 20 MG TABS tablet, Take 1 tablet (20 mg total) by mouth daily with supper., Disp: 30 tablet, Rfl: 3   zolpidem (AMBIEN) 5 MG tablet, Take 5 mg by mouth at bedtime as needed for sleep., Disp: , Rfl:    acetaminophen (TYLENOL) 500 MG tablet, Take 500 mg by mouth every 6 (six) hours as needed for mild pain ($RemoveBe'500mg'xWbpXcLgO$  to $R'1000mg'Gh$  Q6 hours PRN). (Patient not taking: No sig reported), Disp: , Rfl:    prochlorperazine (COMPAZINE) 10 MG tablet, Take 1 tablet (10 mg total) by mouth every 6 (six) hours as needed (Nausea or vomiting). (Patient not taking: No sig reported), Disp: 30 tablet, Rfl: 1 No current facility-administered medications for this visit.  Facility-Administered Medications Ordered in Other Visits:    heparin lock flush 100 unit/mL, 500 Units, Intravenous, Once, Finnegan, Kathlene November, MD   sodium chloride flush (NS) 0.9 % injection 10 mL,  10  mL, Intravenous, PRN, Lloyd Huger, MD  Physical exam:  Vitals:   08/19/21 0851  BP: 139/64  Pulse: 75  Resp: 20  Temp: 98.6 F (37 C)  TempSrc: Oral  SpO2: 99%  Weight: 156 lb (70.8 kg)   Physical Exam Constitutional:      General: She is not in acute distress. HENT:     Mouth/Throat:     Comments: No evidence of any acute gum swelling or overt dental infection noted. Cardiovascular:     Rate and Rhythm: Normal rate and regular rhythm.     Heart sounds: Normal heart sounds.  Pulmonary:     Effort: Pulmonary effort is normal.     Breath sounds: Normal breath sounds.  Abdominal:     General: Bowel sounds are normal.     Palpations: Abdomen is soft.  Musculoskeletal:     Comments: There is diffuse redness and subcutaneous edema noted over the right lower extremity.  Dorsalis pedis pulses palpable in the right lower extremity  Skin:    General: Skin is warm and dry.     Comments: RLE erythema and edema  Neurological:     Mental Status: She is alert and oriented to person, place, and time.     CMP Latest Ref Rng & Units 08/19/2021  Glucose 70 - 99 mg/dL 116(H)  BUN 8 - 23 mg/dL 18  Creatinine 0.44 - 1.00 mg/dL 1.01(H)  Sodium 135 - 145 mmol/L 138  Potassium 3.5 - 5.1 mmol/L 4.5  Chloride 98 - 111 mmol/L 108  CO2 22 - 32 mmol/L 24  Calcium 8.9 - 10.3 mg/dL 8.7(L)  Total Protein 6.5 - 8.1 g/dL 6.6  Total Bilirubin 0.3 - 1.2 mg/dL 0.5  Alkaline Phos 38 - 126 U/L 66  AST 15 - 41 U/L 18  ALT 0 - 44 U/L 11   CBC Latest Ref Rng & Units 08/19/2021  WBC 4.0 - 10.5 K/uL 5.3  Hemoglobin 12.0 - 15.0 g/dL 9.2(L)  Hematocrit 36.0 - 46.0 % 29.6(L)  Platelets 150 - 400 K/uL 323         CT Abdomen Pelvis Wo Contrast  Result Date: 08/04/2021 CLINICAL DATA:  Breast cancer staging EXAM: CT ABDOMEN AND PELVIS WITHOUT CONTRAST TECHNIQUE: Multidetector CT imaging of the abdomen and pelvis was performed following the standard protocol without IV contrast. Oral enteric  contrast was administered. COMPARISON:  None. FINDINGS: Lower chest: No acute abnormality. Hepatobiliary: No solid liver abnormality is seen. No gallstones, gallbladder wall thickening, or biliary dilatation. Pancreas: Unremarkable. No pancreatic ductal dilatation or surrounding inflammatory changes. Spleen: Normal in size without significant abnormality. Adrenals/Urinary Tract: Adrenal glands are unremarkable. Kidneys are normal, without renal calculi, solid lesion, or hydronephrosis. Bladder is unremarkable. Stomach/Bowel: Stomach is within normal limits. Appendix appears normal. No evidence of bowel wall thickening, distention, or inflammatory changes. Large burden of stool throughout the colon and rectum. Vascular/Lymphatic: Aortic atherosclerosis. Multiple enlarged bilateral iliac, inguinal, and pelvic sidewall lymph nodes, largest left inguinal nodes measuring up to 2.5 x 1.5 cm (series 2, image 81). Prominent, although not pathologically enlarged lymph nodes in the upper abdomen. Reproductive: No mass or other significant abnormality. Other: No abdominal wall hernia or abnormality. No abdominopelvic ascites. Musculoskeletal: Numerous sclerotic osseous lesions throughout the included skeleton, particularly of T12 through L2 vertebral bodies. IMPRESSION: 1. Multiple enlarged bilateral iliac, inguinal, and pelvic sidewall lymph nodes. Prominent, although not pathologically enlarged lymph nodes in the upper abdomen. Findings are highly concerning for nodal metastatic  disease in the setting of known metastatic breast cancer, lymphoma an alternate differential consideration. 2. No obvious evidence of solid organ metastatic disease in the abdomen or pelvis, assessment significantly limited by noncontrast CT. 3. Numerous sclerotic osseous metastatic lesions throughout the included skeleton. Aortic Atherosclerosis (ICD10-I70.0). Electronically Signed   By: Eddie Candle M.D.   On: 08/04/2021 16:14   NM Bone Scan  Whole Body  Result Date: 08/05/2021 CLINICAL DATA:  Breast cancer, surveillance breast cancer. Bilateral arm and shoulder pain. EXAM: NUCLEAR MEDICINE WHOLE BODY BONE SCAN TECHNIQUE: Whole body anterior and posterior images were obtained approximately 3 hours after intravenous injection of radiopharmaceutical. RADIOPHARMACEUTICALS:  21.24 mCi Technetium-65m MDP IV COMPARISON:  None. Findings are correlated with CT examination of the abdomen pelvis performed at 8:30 a.m. FINDINGS: There is focal uptake of radiotracer within the frontal bone, cervical spine, proximal right humerus, medial right clavicle, left glenoid, sternum, thoracolumbar spine particularly T12 and L1, and the right superior pubic ramus in keeping with osseous metastatic disease. More curvilinear uptake within the right humeral head anteriorly is more suggestive of degenerative change involving the glenohumeral articulation, however, metastatic disease is not excluded. Uptake within the right knee and foot is likely degenerative in nature. Normal soft tissue distribution. Normal uptake and excretion within the kidneys. IMPRESSION: Widespread osseous metastatic disease. Dedicated radiographs of the cervical spine and right humerus is recommended to assess the degree of osseous involvement assess for potential pathologic fracture. CT imaging of the chest may be helpful for both confirmation of metastatic disease as well as response assessment purposes. Electronically Signed   By: Fidela Salisbury M.D.   On: 08/05/2021 00:06   IR Fluoro Guide CV Line Left  Result Date: 07/27/2021 INDICATION: Breast cancer, access for chemotherapy, poor functioning port catheter EXAM: FLUOROSCOPIC LEFT IJ POWER PORT CATHETER INJECTION MEDICATIONS: 20 cc omni 300 ANESTHESIA/SEDATION: None. FLUOROSCOPY TIME:  Fluoroscopy Time: 0 minutes 30 seconds (2.7 mGy). COMPLICATIONS: None immediate. PROCEDURE: Informed written consent was obtained from the patient after a  thorough discussion of the procedural risks, benefits and alternatives. All questions were addressed. Maximal Sterile Barrier Technique was utilized including caps, mask, sterile gowns, sterile gloves, sterile drape, hand hygiene and skin antiseptic. A timeout was performed prior to the initiation of the procedure. Under sterile conditions, the left IJ power port catheter was accessed. Blood aspirated easily followed by saline flush. Contrast injection performed for port catheter injection. Port catheter tubing is intact. Tip is in the upper SVC. Contrast does flow freely from the port catheter tip. No fibrin sheath. No discontinuity, obstruction or leakage. Port catheter flushed again with saline followed by heparin flush. Needle removed. IMPRESSION: Patent left IJ power port catheter. Tip is short in the proximal SVC. Negative for fibrin sheath. Electronically Signed   By: [name].  Shick M.D.   On: 07/27/2021 12:34   ECHOCARDIOGRAM COMPLETE  Result Date: 08/01/2021    ECHOCARDIOGRAM REPORT   Patient Name:   KIASIA CHOU Date of Exam: 08/01/2021 Medical Rec #:  [id number]        Height:       64.0 in Accession #:    [id number]       Weight:       160.8 lb Date of Birth:  [birth year]        BSA:          1.783 m Patient Age:    20 years         BP:  172/84 mmHg Patient Gender: F                HR:           95 bpm. Exam Location:  [facility] Procedure: 2D Echo, Color Doppler, Cardiac Doppler and Strain Analysis Indications:     Z09 Chemo  History:         Patient has no prior history of Echocardiogram examinations.                  Risk Factors:Hypertension. Metastic breast cancer; Bone                  metastases; Abnormal ECG.  Sonographer:     Charmayne Sheer Referring Phys:  [id number] Weston Anna RAO Diagnosing Phys: Kathlyn Sacramento MD  Sonographer Comments: Suboptimal subcostal window. Global longitudinal strain was attempted. IMPRESSIONS  1. Left ventricular ejection fraction, by estimation, is 60 to 65%. The left  ventricle has normal function. The left ventricle has no regional wall motion abnormalities. There is mild left ventricular hypertrophy. Left ventricular diastolic parameters were normal. The average left ventricular global longitudinal strain is -17.7 %. The global longitudinal strain is normal.  2. Right ventricular systolic function is normal. The right ventricular size is normal. Tricuspid regurgitation signal is inadequate for assessing PA pressure.  3. Left atrial size was mildly dilated.  4. The mitral valve is normal in structure. Mild mitral valve regurgitation. No evidence of mitral stenosis.  5. The aortic valve is normal in structure. Aortic valve regurgitation is not visualized. Mild to moderate aortic valve sclerosis/calcification is present, without any evidence of aortic stenosis.  6. The inferior vena cava is normal in size with <50% respiratory variability, suggesting right atrial pressure of 8 mmHg. FINDINGS  Left Ventricle: Left ventricular ejection fraction, by estimation, is 60 to 65%. The left ventricle has normal function. The left ventricle has no regional wall motion abnormalities. The average left ventricular global longitudinal strain is -17.7 %. The global longitudinal strain is normal. The left ventricular internal cavity size was normal in size. There is mild left ventricular hypertrophy. Left ventricular diastolic parameters were normal. Right Ventricle: The right ventricular size is normal. No increase in right ventricular wall thickness. Right ventricular systolic function is normal. Tricuspid regurgitation signal is inadequate for assessing PA pressure. Left Atrium: Left atrial size was mildly dilated. Right Atrium: Right atrial size was normal in size. Pericardium: There is no evidence of pericardial effusion. Mitral Valve: The mitral valve is normal in structure. Mild mitral valve regurgitation. No evidence of mitral valve stenosis. MV peak gradient, 7.3 mmHg. The mean mitral  valve gradient is 3.0 mmHg. Tricuspid Valve: The tricuspid valve is normal in structure. Tricuspid valve regurgitation is trivial. No evidence of tricuspid stenosis. Aortic Valve: The aortic valve is normal in structure. Aortic valve regurgitation is not visualized. Mild to moderate aortic valve sclerosis/calcification is present, without any evidence of aortic stenosis. Aortic valve mean gradient measures 8.0 mmHg. Aortic valve peak gradient measures 14.7 mmHg. Aortic valve area, by VTI measures 2.49 cm. Pulmonic Valve: The pulmonic valve was normal in structure. Pulmonic valve regurgitation is not visualized. No evidence of pulmonic stenosis. Aorta: The aortic root is normal in size and structure. Venous: The inferior vena cava is normal in size with less than 50% respiratory variability, suggesting right atrial pressure of 8 mmHg. IAS/Shunts: No atrial level shunt detected by color flow Doppler.  LEFT VENTRICLE PLAX 2D LVIDd:  3.80 cm  Diastology LVIDs:         2.30 cm  LV e' medial:    8.05 cm/s LV PW:         1.10 cm  LV E/e' medial:  11.8 LV IVS:        1.10 cm  LV e' lateral:   11.20 cm/s LVOT diam:     2.10 cm  LV E/e' lateral: 8.5 LV SV:         90 LV SV Index:   51       2D Longitudinal Strain LVOT Area:     3.46 cm 2D Strain GLS Avg:     -17.7 %  RIGHT VENTRICLE RV Basal diam:  3.00 cm TAPSE (M-mode): 2.7 cm LEFT ATRIUM             Index       RIGHT ATRIUM           Index LA diam:        4.40 cm 2.47 cm/m  RA Area:     13.00 cm LA Vol (A2C):   84.5 ml 47.39 ml/m RA Volume:   30.10 ml  16.88 ml/m LA Vol (A4C):   45.4 ml 25.46 ml/m LA Biplane Vol: 65.4 ml 36.68 ml/m  AORTIC VALVE                    PULMONIC VALVE AV Area (Vmax):    2.55 cm     PV Vmax:       1.44 m/s AV Area (Vmean):   2.36 cm     PV Vmean:      103.000 cm/s AV Area (VTI):     2.49 cm     PV VTI:        0.276 m AV Vmax:           191.50 cm/s  PV Peak grad:  8.3 mmHg AV Vmean:          134.000 cm/s PV Mean grad:  5.0 mmHg  AV VTI:            0.362 m AV Peak Grad:      14.7 mmHg AV Mean Grad:      8.0 mmHg LVOT Vmax:         141.00 cm/s LVOT Vmean:        91.300 cm/s LVOT VTI:          0.260 m LVOT/AV VTI ratio: 0.72  AORTA Ao Root diam: 3.10 cm MITRAL VALVE MV Area (PHT): 5.42 cm    SHUNTS MV Area VTI:   3.07 cm    Systemic VTI:  0.26 m MV Peak grad:  7.3 mmHg    Systemic Diam: 2.10 cm MV Mean grad:  3.0 mmHg MV Vmax:       1.35 m/s MV Vmean:      87.3 cm/s MV Decel Time: 140 msec MV E velocity: 95.10 cm/s MV A velocity: 97.70 cm/s MV E/A ratio:  0.97 Kathlyn Sacramento MD Electronically signed by Kathlyn Sacramento MD Signature Date/Time: 08/01/2021/4:33:57 PM    Final      Assessment and plan- Patient is a 75 y.o. female with ER weakly +10%, PR negative and HER2 positive breast cancer diagnosed with 2014 now with multiple areas of metastatic disease.  She is here for on treatment assessment prior to cycle 2-day 15 of Taxol chemotherapy  Counts okay to proceed with Taxol/Herceptin/Perjeta chemotherapy today.  Patient will return to  see Dr. Janese Banks next week for Taxol alone.    Right lower extremity cellulitis: Patient is on a course of doxycycline for possible cellulitis.  Discussed with Dr. Janese Banks and will refer patient to ID and sent for Dopplers to rule out DVT.  Patient has multiple concerns about her imaging from [name] and regarding her cancer and prognosis.  I answered her questions to the best my ability today.  Discussed with Dr. Janese Banks and Judeen Hammans, [place].  Bone mets: Continue monthly Zometa   Visit Diagnosis 1. Metastatic breast cancer (HCC)      Altha Harm, PhD, NP-C [facility] [phone] 08/19/2021 9:47 AM

## 2021-08-19 NOTE — Progress Notes (Signed)
Pt here for follow-up. Recently moved from [facility]. Has had trouble getting her records transferred from [facility]. Currently being treated for cellulitis in the right leg. Concerned she has a fungal infection of her toe nails and fingernails.

## 2021-08-24 ENCOUNTER — Other Ambulatory Visit
Admission: RE | Admit: 2021-08-24 | Discharge: 2021-08-24 | Disposition: A | Discharge status: Home / Self Care | Payer: Medicare Other | Source: Ambulatory Visit | Attending: Internal Medicine | Admitting: Internal Medicine

## 2021-08-24 ENCOUNTER — Ambulatory Visit
Admission: RE | Admit: 2021-08-24 | Discharge: 2021-08-24 | Disposition: A | Discharge status: Home / Self Care | Payer: Medicare Other | Source: Ambulatory Visit | Attending: Internal Medicine | Admitting: Internal Medicine

## 2021-08-24 ENCOUNTER — Encounter: Payer: Medicare Other | Attending: Internal Medicine | Admitting: Internal Medicine

## 2021-08-24 ENCOUNTER — Other Ambulatory Visit: Payer: Self-pay | Admitting: Internal Medicine

## 2021-08-24 ENCOUNTER — Ambulatory Visit
Admission: RE | Admit: 2021-08-24 | Discharge: 2021-08-24 | Disposition: A | Discharge status: Home / Self Care | Payer: Medicare Other | Attending: Internal Medicine | Admitting: Internal Medicine

## 2021-08-24 ENCOUNTER — Other Ambulatory Visit: Payer: Self-pay

## 2021-08-24 DIAGNOSIS — S91104A Unspecified open wound of right lesser toe(s) without damage to nail, initial encounter: Secondary | ICD-10-CM

## 2021-08-24 DIAGNOSIS — Z923 Personal history of irradiation: Secondary | ICD-10-CM | POA: Insufficient documentation

## 2021-08-24 DIAGNOSIS — Z7901 Long term (current) use of anticoagulants: Secondary | ICD-10-CM | POA: Diagnosis not present

## 2021-08-24 DIAGNOSIS — N184 Chronic kidney disease, stage 4 (severe): Secondary | ICD-10-CM | POA: Diagnosis not present

## 2021-08-24 DIAGNOSIS — S91101D Unspecified open wound of right great toe without damage to nail, subsequent encounter: Secondary | ICD-10-CM | POA: Insufficient documentation

## 2021-08-24 DIAGNOSIS — X58XXXD Exposure to other specified factors, subsequent encounter: Secondary | ICD-10-CM | POA: Insufficient documentation

## 2021-08-24 DIAGNOSIS — S81801A Unspecified open wound, right lower leg, initial encounter: Secondary | ICD-10-CM | POA: Diagnosis present

## 2021-08-24 DIAGNOSIS — S91104D Unspecified open wound of right lesser toe(s) without damage to nail, subsequent encounter: Secondary | ICD-10-CM | POA: Insufficient documentation

## 2021-08-24 DIAGNOSIS — I872 Venous insufficiency (chronic) (peripheral): Secondary | ICD-10-CM | POA: Diagnosis not present

## 2021-08-24 DIAGNOSIS — S81802D Unspecified open wound, left lower leg, subsequent encounter: Secondary | ICD-10-CM | POA: Diagnosis present

## 2021-08-24 DIAGNOSIS — B999 Unspecified infectious disease: Secondary | ICD-10-CM | POA: Insufficient documentation

## 2021-08-24 DIAGNOSIS — I129 Hypertensive chronic kidney disease with stage 1 through stage 4 chronic kidney disease, or unspecified chronic kidney disease: Secondary | ICD-10-CM | POA: Diagnosis not present

## 2021-08-24 DIAGNOSIS — Z86718 Personal history of other venous thrombosis and embolism: Secondary | ICD-10-CM | POA: Diagnosis not present

## 2021-08-24 DIAGNOSIS — S91101A Unspecified open wound of right great toe without damage to nail, initial encounter: Secondary | ICD-10-CM | POA: Diagnosis not present

## 2021-08-24 DIAGNOSIS — Z853 Personal history of malignant neoplasm of breast: Secondary | ICD-10-CM | POA: Insufficient documentation

## 2021-08-24 NOTE — Progress Notes (Signed)
MEHREEN, AZIZI ([id number]) Visit Report for 08/24/2021 Arrival Information Details Patient Name: Laura Santiago, Laura Santiago. Date of Service: 08/24/2021 10:00 AM Medical Record Number: [id number] Patient Account Number: [id number] Date of Birth/Sex: July 29, 1946 (75 y.o. F) Treating RN: Dolan Amen Primary Care Glen Kesinger: SYSTEM, PCP Other Clinician: Referring Eliakim Tendler: Randa Evens Treating Orah Sonnen/Extender: Yaakov Guthrie in Treatment: 7 Visit Information History Since Last Visit Pain Present Now: No Patient Arrived: Walker Arrival Time: 10:11 Accompanied By: self/service dog Transfer Assistance: None Patient Identification Verified: Yes Secondary Verification Process Completed: Yes Patient Has Alerts: Yes Patient Alerts: PT HAS SERVICE ANIMAL Electronic Signature(s) Signed: 08/24/2021 4:36:53 PM By: Dolan Amen RN Entered By: Dolan Amen on 08/24/2021 10:11:54 Laura Santiago ([id number]) -------------------------------------------------------------------------------- Clinic Level of Care Assessment Details Patient Name: Laura Santiago. Date of Service: 08/24/2021 10:00 AM Medical Record Number: [id number] Patient Account Number: [id number] Date of Birth/Sex: September 13, 1946 (75 y.o. F) Treating RN: Dolan Amen Primary Care Verdelle Valtierra: SYSTEM, PCP Other Clinician: Referring Kema Santaella: Randa Evens Treating Ritu Gagliardo/Extender: Yaakov Guthrie in Treatment: 7 Clinic Level of Care Assessment Items TOOL 1 Quantity Score _0  - Use when EandM and Procedure is performed on INITIAL visit 0 ASSESSMENTS - Nursing Assessment / Reassessment _1  - General Physical Exam (combine w/ comprehensive assessment (listed just below) when performed on new 0 pt. evals) _2  - 0 Comprehensive Assessment (HX, ROS, Risk Assessments, Wounds Hx, etc.) ASSESSMENTS - Wound and Skin Assessment / Reassessment _3  - Dermatologic / Skin Assessment (not related to wound area) 0 ASSESSMENTS -  Ostomy and/or Continence Assessment and Care _4  - Incontinence Assessment and Management 0 _5  - 0 Ostomy Care Assessment and Management (repouching, etc.) PROCESS - Coordination of Care _6  - Simple Patient / Family Education for ongoing care 0 _7  - 0 Complex (extensive) Patient / Family Education for ongoing care _8  - 0 Staff obtains Programmer, systems, Records, Test Results / Process Orders _9  - 0 Staff telephones HHA, Nursing Homes / Clarify orders / etc _10  - 0 Routine Transfer to another Facility (non-emergent condition) _11  - 0 Routine Hospital Admission (non-emergent condition) _12  - 0 New Admissions / Biomedical engineer / Ordering NPWT, Apligraf, etc. _13  - 0 Emergency Hospital Admission (emergent condition) PROCESS - Special Needs _14  - Pediatric / Minor Patient Management 0 _15  - 0 Isolation Patient Management _16  - 0 Hearing / Language / Visual special needs _17  - 0 Assessment of Community assistance (transportation, D/C planning, etc.) _18  - 0 Additional assistance / Altered mentation _19  - 0 Support Surface(s) Assessment (bed, cushion, seat, etc.) INTERVENTIONS - Miscellaneous _20  - External ear exam 0 _21  - 0 Patient Transfer (multiple staff / Civil Service fast streamer / Similar devices) _22  - 0 Simple Staple / Suture removal (25 or less) _23  - 0 Complex Staple / Suture removal (26 or more) _24  - 0 Hypo/Hyperglycemic Management (do not check if billed separately) _25  - 0 Ankle / Brachial Index (ABI) - do not check if billed separately Has the patient been seen at the hospital within the last three years: Yes Total Score: 0 Level Of Care: ____ Laura Santiago ([id number]) Electronic Signature(s) Signed: 08/24/2021 4:36:53 PM By: Dolan Amen RN Entered By: Dolan Amen on 08/24/2021 10:52:07 Laura Santiago ([id number]) -------------------------------------------------------------------------------- Encounter Discharge Information Details Patient Name: Laura Santiago. Date  of Service: 08/24/2021 10:00 AM Medical Record Number: [id number] Patient Account Number: [id number] Date of Birth/Sex: [birth year] (75 y.o. F) Treating RN: Dolan Amen Primary Care Ervin Rothbauer: SYSTEM, PCP Other Clinician: Referring Rayen Palen: Janese Banks,  Archana Treating Keiandre Cygan/Extender: Yaakov Guthrie in Treatment: 7 Encounter Discharge Information Items Post Procedure Vitals Discharge Condition: Stable Temperature (F): 99.0 Ambulatory Status: Walker Pulse (bpm): 83 Discharge Destination: Home Respiratory Rate (breaths/min): 18 Transportation: Private Auto Blood Pressure (mmHg): 109/63 Accompanied By: self Schedule Follow-up Appointment: No Clinical Summary of Care: Electronic Signature(s) Signed: 08/24/2021 4:36:53 PM By: Dolan Amen RN Entered By: Dolan Amen on 08/24/2021 11:14:32 Laura Santiago ([id number]) -------------------------------------------------------------------------------- Lower Extremity Assessment Details Patient Name: Laura Santiago. Date of Service: 08/24/2021 10:00 AM Medical Record Number: [id number] Patient Account Number: [id number] Date of Birth/Sex: 06/02/1946 (75 y.o. F) Treating RN: Dolan Amen Primary Care Lalitha Ilyas: SYSTEM, PCP Other Clinician: Referring Reshma Hoey: Randa Evens Treating Drevion Offord/Extender: Yaakov Guthrie in Treatment: 7 Edema Assessment Assessed: [Left: No] [Right: No] Edema: [Left: Yes] [Right: Yes] Calf Left: Right: Point of Measurement: 29 cm From Medial Instep 32.5 cm 40.5 cm Ankle Left: Right: Point of Measurement: 10 cm From Medial Instep 21.5 cm 25 cm Notes redness and 3+ pitting edema noted on right leg Electronic Signature(s) Signed: 08/24/2021 4:36:53 PM By: Dolan Amen RN Entered By: Dolan Amen on 08/24/2021 10:32:40 Laura Santiago ([id number]) -------------------------------------------------------------------------------- Multi Wound Chart Details Patient Name: Laura Santiago. Date of Service: 08/24/2021 10:00 AM Medical Record Number: [id number] Patient Account Number: [id number] Date of Birth/Sex: 1946/01/10 (75 y.o. 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 Comorbid History: Hypertension, Osteoarthritis, Hypertension, Osteoarthritis, Hypertension, Osteoarthritis, Received Chemotherapy, Received Received Chemotherapy, Received Received Chemotherapy, Received Radiation Radiation Radiation Date Acquired: 04/06/2021 08/24/2021 08/24/2021 Weeks of Treatment: 7 0 0 Wound Status: Open Open Open Clustered Wound: Yes No No Clustered Quantity: 2 N/A N/A Measurements L x W x D (cm) 9x9.5x0.2 0.4x0.5x0.4 0.3x0.3x0.2 Area (cm) : 67.152 0.157 0.071 Volume (cm) : 13.43 0.063 0.014 % Reduction in Area: 25.70% 0.00% 0.00% % Reduction in Volume: 25.70% 0.00% 0.00% Classification: Full Thickness Without Exposed Full Thickness Without Exposed Full Thickness Without Exposed Support Structures Support Structures Support Structures Exudate Amount: Large Medium Medium Exudate Type: Purulent Serosanguineous Serous Exudate Color: yellow, brown, green red, brown amber Granulation Amount: Medium (34-66%) Medium (34-66%) Large (67-100%) Granulation Quality: Red Red Pink Necrotic Amount: Medium (34-66%) Medium (34-66%) None Present (0%) Exposed Structures: Fat Layer (Subcutaneous Tissue): Fat Layer  (Subcutaneous Tissue): Fat Layer (Subcutaneous Tissue): Yes Yes Yes Fascia: No Fascia: No Fascia: No Tendon: No Tendon: No Tendon: No Muscle: No Muscle: No Muscle: No Joint: No Joint: No Joint: No Bone: No Bone: No Bone: No Epithelialization: Large (67-100%) None None Debridement: Debridement - Excisional Debridement - Excisional Debridement - Selective/Open Wound Pre-procedure Verification/Time 10:40 10:40 10:40 Out Taken: Tissue Debrided: Subcutaneous, Slough Callus, Subcutaneous N/A Level: Skin/Subcutaneous Tissue Skin/Subcutaneous Tissue Skin/Epidermis Debridement Area (sq cm): 85.5 1 0.09 Instrument: Curette Curette Curette Bleeding: Minimum Minimum Minimum Hemostasis Achieved: Pressure Pressure Pressure Procedure was tolerated well Procedure was tolerated well Procedure was tolerated well ORTHA, METTS M. ([id number]) Debridement Treatment Response: Post Debridement 9x9.5x0.2 0.4x0.5x0.4 0.3x0.3x0.2 Measurements L x W x D (cm) Post Debridement Volume: 13.43 0.063 0.014 (cm) Procedures Performed: Debridement  Debridement Debridement Wound Number: 4 N/A N/A Photos: No Photos N/A N/A Wound Location: Right Toe Third N/A N/A Wounding Event: Footwear Injury N/A N/A Primary Etiology: Neuropathic Ulcer-Non Diabetic N/A N/A Comorbid History: Hypertension, Osteoarthritis, N/A N/A Received Chemotherapy, Received Radiation Date Acquired: 08/24/2021 N/A N/A Weeks of Treatment: 0 N/A N/A Wound Status: Open N/A N/A Clustered Wound: No N/A N/A Clustered Quantity: N/A N/A N/A Measurements L x W x D (cm) 0.2x0.2x0.2 N/A N/A Area (cm) : 0.031 N/A N/A Volume (cm) : 0.006 N/A N/A % Reduction in Area: 0.00% N/A N/A % Reduction in Volume: 0.00% N/A N/A Classification: Full Thickness Without Exposed N/A N/A Support Structures Exudate Amount: Medium N/A N/A Exudate Type: Serous N/A N/A Exudate Color: amber N/A N/A Granulation Amount: Large (67-100%) N/A N/A Granulation  Quality: Pink N/A N/A Necrotic Amount: None Present (0%) N/A N/A Exposed Structures: Fat Layer (Subcutaneous Tissue): N/A N/A Yes Fascia: No Tendon: No Muscle: No Joint: No Bone: No Epithelialization: None N/A N/A Debridement: Debridement - Selective/Open N/A N/A Wound Pre-procedure Verification/Time 10:40 N/A N/A Out Taken: Tissue Debrided: N/A N/A N/A Level: Skin/Epidermis N/A N/A Debridement Area (sq cm): 0.04 N/A N/A Instrument: Curette N/A N/A Bleeding: Minimum N/A N/A Hemostasis Achieved: Pressure N/A N/A Debridement Treatment Procedure was tolerated well N/A N/A Response: Post Debridement 0.2x0.2x0.2 N/A N/A Measurements L x W x D (cm) Post Debridement Volume: 0.006 N/A N/A (cm) Procedures Performed: Debridement N/A N/A Treatment Notes Electronic Signature(s) Signed: 08/24/2021 11:21:42 AM By: Kalman Shan DO Entered By: Kalman Shan on 08/24/2021 11:11:16 Laura Santiago ([id number]) -------------------------------------------------------------------------------- Multi-Disciplinary Care Plan Details Patient Name: Laura Santiago. Date of Service: 08/24/2021 10:00 AM Medical Record Number: [id number] Patient Account Number: [id number] Date of Birth/Sex: 10-21-1946 (75 y.o. F) Treating RN: Dolan Amen Primary Care Ileene Allie: SYSTEM, PCP Other Clinician: Referring Ebonee Stober: Randa Evens Treating Preslyn Warr/Extender: Yaakov Guthrie in Treatment: 7 Active Inactive Necrotic Tissue Nursing Diagnoses: Impaired tissue integrity related to necrotic/devitalized tissue Knowledge deficit related to management of necrotic/devitalized tissue Goals: Necrotic/devitalized tissue will be minimized in the wound bed Date Initiated: 07/06/2021 Date Inactivated: 07/27/2021 Target Resolution Date: 07/06/2021 Goal Status: Met Patient/caregiver will verbalize understanding of reason and process for debridement of necrotic tissue Date Initiated: 07/06/2021 Target  Resolution Date: 07/06/2021 Goal Status: Active Interventions: Assess patient pain level pre-, during and post procedure and prior to discharge Provide education on necrotic tissue and debridement process Treatment Activities: Apply topical anesthetic as ordered : 07/06/2021 Biologic debridement : 07/06/2021 Enzymatic debridement : 07/06/2021 Excisional debridement : 07/06/2021 Notes: Wound/Skin Impairment Nursing Diagnoses: Impaired tissue integrity Goals: Patient/caregiver will verbalize understanding of skin care regimen Date Initiated: 07/06/2021 Date Inactivated: 07/27/2021 Target Resolution Date: 07/06/2021 Goal Status: Met Ulcer/skin breakdown will have a volume reduction of 30% by week 4 Date Initiated: 07/06/2021 Target Resolution Date: 08/06/2021 Goal Status: Active Ulcer/skin breakdown will have a volume reduction of 50% by week 8 Date Initiated: 07/06/2021 Target Resolution Date: 09/06/2021 Goal Status: Active Ulcer/skin breakdown will have a volume reduction of 80% by week 12 Date Initiated: 07/06/2021 Target Resolution Date: 10/06/2021 Goal Status: Active Ulcer/skin breakdown will heal within 14 weeks Date Initiated: 07/06/2021 Target Resolution Date: 11/06/2021 Goal Status: Active Interventions: Assess patient/caregiver ability to obtain necessary supplies Assess patient/caregiver ability to perform ulcer/skin care regimen upon admission and as needed Assess ulceration(s) every visit LAURIEANN, FRIDDLE ([id number]) Treatment Activities: Referred to DME [facility] for dressing supplies : 07/06/2021 Skin care regimen initiated : 07/06/2021 Notes: Electronic Signature(s) Signed: 08/24/2021 4:36:53 PM  By: Dolan Amen RN Entered By: Dolan Amen on 08/24/2021 10:35:55 Laura Santiago ([id number]) -------------------------------------------------------------------------------- Pain Assessment Details Patient Name: TYANN, NIEHAUS. Date of Service: 08/24/2021 10:00  AM Medical Record Number: [id number] Patient Account Number: [id number] Date of Birth/Sex: [birth year] (75 y.o. F) Treating RN: Dolan Amen Primary Care Sahib Pella: SYSTEM, PCP Other Clinician: Referring Haris Baack: Randa Evens Treating Hasna Stefanik/Extender: Yaakov Guthrie in Treatment: 7 Active Problems Location of Pain Severity and Description of Pain Patient Has Paino No Site Locations Rate the pain. Current Pain Level: 0 Pain Management and Medication Current Pain Management: Electronic Signature(s) Signed: 08/24/2021 4:36:53 PM By: Dolan Amen RN Entered By: Dolan Amen on 08/24/2021 10:12:56 Laura Santiago ([id number]) -------------------------------------------------------------------------------- Patient/Caregiver Education Details Patient Name: Laura Santiago. Date of Service: 08/24/2021 10:00 AM Medical Record Number: [id number] Patient Account Number: [id number] Date of Birth/Gender: June 09, 1946 (74 y.o. F) Treating RN: Dolan Amen Primary Care Physician: SYSTEM, PCP Other Clinician: Referring Physician: Randa Evens Treating Physician/Extender: Yaakov Guthrie in Treatment: 7 Education Assessment Education Provided To: Patient Education Topics Provided Wound Debridement: Methods: Explain/Verbal Responses: State content correctly Wound/Skin Impairment: Methods: Explain/Verbal Responses: State content correctly Electronic Signature(s) Signed: 08/24/2021 4:36:53 PM By: Dolan Amen RN Entered By: Dolan Amen on 08/24/2021 11:12:42 Laura Santiago ([id number]) -------------------------------------------------------------------------------- Wound Assessment Details Patient Name: Laura Santiago. Date of Service: 08/24/2021 10:00 AM Medical Record Number: [id number] Patient Account Number: [id number] Date of Birth/Sex: December 04, 1946 (75 y.o. F) Treating RN: Dolan Amen Primary Care Berea Majkowski: SYSTEM, PCP Other Clinician: Referring  Kayliee Atienza: Randa Evens Treating Lauryl Seyer/Extender: Yaakov Guthrie in Treatment: 7 Wound Status Wound Number: 1 Primary Venous Leg Ulcer Etiology: Wound Location: Left, Medial Lower Leg Wound Status: Open Wounding Event: Gradually Appeared Comorbid Hypertension, Osteoarthritis, Received Chemotherapy, Date Acquired: 04/06/2021 History: Received Radiation Weeks Of Treatment: 7 Clustered Wound: Yes Photos Wound Measurements Length: (cm) 9 Width: (cm) 9.5 Depth: (cm) 0.2 Clustered Quantity: 2 Area: (cm) 67.152 Volume: (cm) 13.43 % Reduction in Area: 25.7% % Reduction in Volume: 25.7% Epithelialization: Large (67-100%) Tunneling: No Undermining: No Wound Description Classification: Full Thickness Without Exposed Support Structu Exudate Amount: Large Exudate Type: Purulent Exudate Color: yellow, brown, green res Foul Odor After Cleansing: No Slough/Fibrino Yes Wound Bed Granulation Amount: Medium (34-66%) Exposed Structure Granulation Quality: Red Fascia Exposed: No Necrotic Amount: Medium (34-66%) Fat Layer (Subcutaneous Tissue) Exposed: Yes Tendon Exposed: No Muscle Exposed: No Joint Exposed: No Bone Exposed: No Treatment Notes Wound #1 (Lower Leg) Wound Laterality: Left, Medial Cleanser Soap and Water Discharge Instruction: Gently cleanse wound with antibacterial soap, rinse and pat dry prior to dressing wounds Peri-Wound Care KEMAYA, DORNER. ([id number]) Desitin Maximum Strength Ointment 4 (oz) Discharge Instruction: Apply on macerated area Topical Gentamicin Discharge Instruction: Apply as directed by Takeisha Cianci. Primary Dressing Silvercel 4 1/4x 4 1/4 (in/in) Discharge Instruction: Apply Silvercel 4 1/4x 4 1/4 (in/in) as instructed Secondary Dressing ABD Pad 5x9 (in/in) Discharge Instruction: Cover with ABD pad Secured With Compression Wrap Profore Lite LF 3 Multilayer Compression [place] Discharge Instruction: Apply 3 multi-layer wrap  as prescribed. Compression Stockings Add-Ons Electronic Signature(s) Signed: 08/24/2021 4:36:53 PM By: Dolan Amen RN Entered By: Dolan Amen on 08/24/2021 10:25:36 ISABELLY, KOBLER ([id number]) -------------------------------------------------------------------------------- Wound Assessment Details Patient Name: SIHAM, BUCARO. Date of Service: 08/24/2021 10:00 AM Medical Record Number: [id number] Patient Account Number: [id number] Date of Birth/Sex: [birth year] (75 y.o. F) Treating RN: Dolan Amen Primary Care Najma Bozarth: SYSTEM, PCP Other Clinician: Referring Feliz Lincoln:  Randa Evens Treating Katheren Jimmerson/Extender: Yaakov Guthrie in Treatment: 7 Wound Status Wound Number: 2 Primary Neuropathic Ulcer-Non Diabetic Etiology: Wound Location: Right Toe Great Wound Status: Open Wounding Event: Gradually Appeared Comorbid Hypertension, Osteoarthritis, Received Chemotherapy, Date Acquired: 08/24/2021 History: Received Radiation Weeks Of Treatment: 0 Clustered Wound: No Photos Wound Measurements Length: (cm) 0.4 Width: (cm) 0.5 Depth: (cm) 0.4 Area: (cm) 0.157 Volume: (cm) 0.063 % Reduction in Area: 0% % Reduction in Volume: 0% Epithelialization: None Tunneling: No Undermining: No Wound Description Classification: Full Thickness Without Exposed Support Structures Exudate Amount: Medium Exudate Type: Serosanguineous Exudate Color: red, brown Foul Odor After Cleansing: No Slough/Fibrino Yes Wound Bed Granulation Amount: Medium (34-66%) Exposed Structure Granulation Quality: Red Fascia Exposed: No Necrotic Amount: Medium (34-66%) Fat Layer (Subcutaneous Tissue) Exposed: Yes Necrotic Quality: Adherent Slough Tendon Exposed: No Muscle Exposed: No Joint Exposed: No Bone Exposed: No Treatment Notes Wound #2 (Toe Great) Wound Laterality: Right Cleanser Wound Cleanser Discharge Instruction: Wash your hands with soap and water. Remove old dressing, discard  into plastic bag and place into trash. Cleanse the wound with Wound Cleanser prior to applying a clean dressing using gauze sponges, not tissues or cotton balls. Do not scrub or use excessive force. Pat dry using gauze sponges, not tissue or cotton balls. LONDIN, ANTONE ([id number]) Peri-Wound Care Topical Primary Dressing Silvercel Small 2x2 (in/in) Discharge Instruction: Apply Silvercel Small 2x2 (in/in) as instructed Secondary Dressing Gauze Discharge Instruction: Cover with dry gauze Secured With 45M [place] Surgical Tape, 2x2 (in/yd) Discharge Instruction: Secure dressing Compression Wrap Compression Stockings Add-Ons Electronic Signature(s) Signed: 08/24/2021 4:36:53 PM By: Dolan Amen RN Entered By: Dolan Amen on 08/24/2021 10:30:14 Laura Santiago ([id number]) -------------------------------------------------------------------------------- Wound Assessment Details Patient Name: Laura Santiago. Date of Service: 08/24/2021 10:00 AM Medical Record Number: [id number] Patient Account Number: [id number] Date of Birth/Sex: 1946-06-11 (75 y.o. F) Treating RN: Dolan Amen Primary Care Rosine Solecki: SYSTEM, PCP Other Clinician: Referring Deija Buhrman: Randa Evens Treating Renn Dirocco/Extender: Yaakov Guthrie in Treatment: 7 Wound Status Wound Number: 3 Primary Neuropathic Ulcer-Non Diabetic Etiology: Wound Location: Right Toe Second Wound Status: Open Wounding Event: Footwear Injury Comorbid Hypertension, Osteoarthritis, Received Chemotherapy, Date Acquired: 08/24/2021 History: Received Radiation Weeks Of Treatment: 0 Clustered Wound: No Wound Measurements Length: (cm) 0.3 Width: (cm) 0.3 Depth: (cm) 0.2 Area: (cm) 0.071 Volume: (cm) 0.014 % Reduction in Area: 0% % Reduction in Volume: 0% Epithelialization: None Tunneling: No Undermining: No Wound Description Classification: Full Thickness Without Exposed Support Structures Exudate  Amount: Medium Exudate Type: Serous Exudate Color: amber Foul Odor After Cleansing: No Slough/Fibrino No Wound Bed Granulation Amount: Large (67-100%) Exposed Structure Granulation Quality: Pink Fascia Exposed: No Necrotic Amount: None Present (0%) Fat Layer (Subcutaneous Tissue) Exposed: Yes Tendon Exposed: No Muscle Exposed: No Joint Exposed: No Bone Exposed: No Treatment Notes Wound #3 (Toe Second) Wound Laterality: Right Cleanser Wound Cleanser Discharge Instruction: Wash your hands with soap and water. Remove old dressing, discard into plastic bag and place into trash. Cleanse the wound with Wound Cleanser prior to applying a clean dressing using gauze sponges, not tissues or cotton balls. Do not scrub or use excessive force. Pat dry using gauze sponges, not tissue or cotton balls. Peri-Wound Care Topical Primary Dressing Hydrofera Blue Ready Transfer Foam, 2.5x2.5 (in/in) Discharge Instruction: Apply Hydrofera Blue Ready to wound bed as directed Secondary Dressing Gauze Discharge Instruction: Apply dry gauze to secure Secured With 45M Medipore H Soft Cloth Surgical Tape, 2x2 (in/yd) Discharge Instruction: Secure dressing  ELANDRA, POWELL ([id number]) Compression Wrap Compression Stockings Add-Ons Electronic Signature(s) Signed: 08/24/2021 4:36:53 PM By: Dolan Amen RN Entered By: Dolan Amen on 08/24/2021 10:48:38 TEJASVI, BRISSETT ([id number]) -------------------------------------------------------------------------------- Wound Assessment Details Patient Name: Laura Santiago. Date of Service: 08/24/2021 10:00 AM Medical Record Number: [id number] Patient Account Number: [id number] Date of Birth/Sex: 21-Sep-1946 (75 y.o. F) Treating RN: Dolan Amen Primary Care Marcille Barman: SYSTEM, PCP Other Clinician: Referring Darleen Moffitt: Randa Evens Treating Kindsey Eblin/Extender: Yaakov Guthrie in Treatment: 7 Wound Status Wound Number: 4 Primary Neuropathic  Ulcer-Non Diabetic Etiology: Wound Location: Right Toe Third Wound Status: Open Wounding Event: Footwear Injury Comorbid Hypertension, Osteoarthritis, Received Chemotherapy, Date Acquired: 08/24/2021 History: Received Radiation Weeks Of Treatment: 0 Clustered Wound: No Wound Measurements Length: (cm) 0.2 Width: (cm) 0.2 Depth: (cm) 0.2 Area: (cm) 0.031 Volume: (cm) 0.006 % Reduction in Area: 0% % Reduction in Volume: 0% Epithelialization: None Tunneling: No Undermining: No Wound Description Classification: Full Thickness Without Exposed Support Structures Exudate Amount: Medium Exudate Type: Serous Exudate Color: amber Foul Odor After Cleansing: No Slough/Fibrino No Wound Bed Granulation Amount: Large (67-100%) Exposed Structure Granulation Quality: Pink Fascia Exposed: No Necrotic Amount: None Present (0%) Fat Layer (Subcutaneous Tissue) Exposed: Yes Tendon Exposed: No Muscle Exposed: No Joint Exposed: No Bone Exposed: No Treatment Notes Wound #4 (Toe Third) Wound Laterality: Right Cleanser Wound Cleanser Discharge Instruction: Wash your hands with soap and water. Remove old dressing, discard into plastic bag and place into trash. Cleanse the wound with Wound Cleanser prior to applying a clean dressing using gauze sponges, not tissues or cotton balls. Do not scrub or use excessive force. Pat dry using gauze sponges, not tissue or cotton balls. Peri-Wound Care Topical Primary Dressing Hydrofera Blue Ready Transfer Foam, 2.5x2.5 (in/in) Discharge Instruction: Apply Hydrofera Blue Ready to wound bed as directed Secondary Dressing Gauze Discharge Instruction: Apply dry gauze to secure Secured With 49M Medipore H Soft Cloth Surgical Tape, 2x2 (in/yd) Discharge Instruction: Secure dressing KERLINE, TRAHAN ([id number]) Compression Wrap Compression Stockings Add-Ons Electronic Signature(s) Signed: 08/24/2021 4:36:53 PM By: Dolan Amen RN Entered By: Dolan Amen on 08/24/2021 10:49:09 Laura Santiago ([id number]) -------------------------------------------------------------------------------- [facility] Details Patient Name: Laura Santiago. Date of Service: 08/24/2021 10:00 AM Medical Record Number: [id number] Patient Account Number: [id number] Date of Birth/Sex: 1946-06-21 (75 y.o. F) Treating RN: Dolan Amen Primary Care Laythan Hayter: SYSTEM, PCP Other Clinician: Referring Rande Roylance: Randa Evens Treating Lavene Penagos/Extender: Yaakov Guthrie in Treatment: 7 Vital Signs Time Taken: 10:11 Temperature (F): 99.0 Height (in): 66 Pulse (bpm): 83 Weight (lbs): 153 Respiratory Rate (breaths/min): 18 Body Mass Index (BMI): 24.7 Blood Pressure (mmHg): 109/63 Reference Range: 80 - 120 mg / dl Electronic Signature(s) Signed: 08/24/2021 4:36:53 PM By: Dolan Amen RN Entered By: Dolan Amen on 08/24/2021 10:12:39

## 2021-08-24 NOTE — Progress Notes (Signed)
SANVI, EHLER ([id number]) Visit Report for 08/24/2021 Chief Complaint Document Details Patient Name: Laura Santiago, Laura Santiago. Date of Service: 08/24/2021 10:00 AM Medical Record Number: [id number] Patient Account Number: [id number] Date of Birth/Sex: November 21, 1946 (75 y.o. F) Treating RN: Dolan Amen Primary Care Provider: SYSTEM, PCP Other Clinician: Referring Provider: Randa Evens Treating Provider/Extender: Yaakov Guthrie in Treatment: 7 Information Obtained from: Patient Chief Complaint Santiago lower extremity Laura Right toe wounds Electronic Signature(s) Signed: 08/24/2021 11:21:42 AM By: Kalman Shan DO Entered By: Kalman Shan on 08/24/2021 11:11:41 Laura Santiago ([id number]) -------------------------------------------------------------------------------- Debridement Details Patient Name: Laura Santiago. Date of Service: 08/24/2021 10:00 AM Medical Record Number: [id number] Patient Account Number: [id number] Date of Birth/Sex: 04-May-1946 (75 y.o. F) Treating RN: Dolan Amen Primary Care Provider: SYSTEM, PCP Other Clinician: Referring Provider: Randa Evens Treating Provider/Extender: Yaakov Guthrie in Treatment: 7 Debridement Performed for Laura #2 Right Toe Great Assessment: Performed By: Physician Kalman Shan, MD Debridement Type: Debridement Level of Consciousness (Pre- Awake and Alert procedure): Pre-procedure Verification/Time Out Yes - 10:40 Taken: Start Time: 10:40 Total Area Debrided (L x W): 1 (cm) x 1 (cm) = 1 (cm) Tissue and other material Viable, Non-Viable, Callus, Subcutaneous debrided: Level: Skin/Subcutaneous Tissue Debridement Description: Excisional Instrument: Curette Bleeding: Minimum Hemostasis Achieved: Pressure Response to Treatment: Procedure was tolerated well Level of Consciousness (Post- Awake and Alert procedure): Post Debridement Measurements of Total Laura Length: (cm) 0.4 Width: (cm)  0.5 Depth: (cm) 0.4 Volume: (cm) 0.063 Character of Laura/Ulcer Post Debridement: Stable Post Procedure Diagnosis Same as Pre-procedure Electronic Signature(s) Signed: 08/24/2021 11:21:42 AM By: Kalman Shan DO Signed: 08/24/2021 4:36:53 PM By: Dolan Amen RN Entered By: Dolan Amen on 08/24/2021 10:40:49 Laura Santiago ([id number]) -------------------------------------------------------------------------------- Debridement Details Patient Name: Laura Santiago. Date of Service: 08/24/2021 10:00 AM Medical Record Number: [id number] Patient Account Number: [id number] Date of Birth/Sex: September 25, 1946 (75 y.o. F) Treating RN: Dolan Amen Primary Care Provider: SYSTEM, PCP Other Clinician: Referring Provider: Randa Evens Treating Provider/Extender: Yaakov Guthrie in Treatment: 7 Debridement Performed for Laura #1 Santiago,Laura Santiago Lower Leg Assessment: Performed By: Physician Kalman Shan, MD Debridement Type: Debridement Severity of Tissue Pre Debridement: Fat layer exposed Level of Consciousness (Pre- Awake and Alert procedure): Pre-procedure Verification/Time Out Yes - 10:40 Taken: Start Time: 10:40 Total Area Debrided (L x W): 9 (cm) x 9.5 (cm) = 85.5 (cm) Tissue and other material Viable, Non-Viable, Slough, Subcutaneous, Slough debrided: Level: Skin/Subcutaneous Tissue Debridement Description: Excisional Instrument: Curette Bleeding: Minimum Hemostasis Achieved: Pressure Response to Treatment: Procedure was tolerated well Level of Consciousness (Post- Awake and Alert procedure): Post Debridement Measurements of Total Laura Length: (cm) 9 Width: (cm) 9.5 Depth: (cm) 0.2 Volume: (cm) 13.43 Character of Laura/Ulcer Post Debridement: Stable Severity of Tissue Post Debridement: Limited to breakdown of skin Post Procedure Diagnosis Same as Pre-procedure Electronic Signature(s) Signed: 08/24/2021 11:21:42 AM By: Kalman Shan DO Signed:  08/24/2021 4:36:53 PM By: Dolan Amen RN Entered By: Dolan Amen on 08/24/2021 10:49:53 Laura Santiago ([id number]) -------------------------------------------------------------------------------- Debridement Details Patient Name: Laura Santiago. Date of Service: 08/24/2021 10:00 AM Medical Record Number: [id number] Patient Account Number: [id number] Date of Birth/Sex: 1946-12-10 (75 y.o. F) Treating RN: Dolan Amen Primary Care Provider: SYSTEM, PCP Other Clinician: Referring Provider: Randa Evens Treating Provider/Extender: Yaakov Guthrie in Treatment: 7 Debridement Performed for Laura #3 Right Toe Second Assessment: Performed By: Physician Kalman Shan, MD Debridement Type: Debridement Level of Consciousness (Pre- Awake and Alert procedure): Pre-procedure Verification/Time Out Yes - 10:40  Taken: Start Time: 10:40 Total Area Debrided (L x W): 0.3 (cm) x 0.3 (cm) = 0.09 (cm) Tissue and other material Non-Viable, Skin: Epidermis debrided: Level: Skin/Epidermis Debridement Description: Selective/Open Laura Instrument: Curette Bleeding: Minimum Hemostasis Achieved: Pressure Response to Treatment: Procedure was tolerated well Level of Consciousness (Post- Awake and Alert procedure): Post Debridement Measurements of Total Laura Length: (cm) 0.3 Width: (cm) 0.3 Depth: (cm) 0.2 Volume: (cm) 0.014 Character of Laura/Ulcer Post Debridement: Stable Post Procedure Diagnosis Same as Pre-procedure Electronic Signature(s) Signed: 08/24/2021 11:21:42 AM By: Kalman Shan DO Signed: 08/24/2021 4:36:53 PM By: Dolan Amen RN Entered By: Dolan Amen on 08/24/2021 10:50:27 Laura Santiago ([id number]) -------------------------------------------------------------------------------- Debridement Details Patient Name: Laura Santiago. Date of Service: 08/24/2021 10:00 AM Medical Record Number: [id number] Patient Account Number: [id number] Date  of Birth/Sex: 1946-05-06 (75 y.o. F) Treating RN: Dolan Amen Primary Care Provider: SYSTEM, PCP Other Clinician: Referring Provider: Randa Evens Treating Provider/Extender: Yaakov Guthrie in Treatment: 7 Debridement Performed for Laura #4 Right Toe Third Assessment: Performed By: Physician Kalman Shan, MD Debridement Type: Debridement Level of Consciousness (Pre- Awake and Alert procedure): Pre-procedure Verification/Time Out Yes - 10:40 Taken: Start Time: 10:40 Total Area Debrided (L x W): 0.2 (cm) x 0.2 (cm) = 0.04 (cm) Tissue and other material Non-Viable, Skin: Epidermis debrided: Level: Skin/Epidermis Debridement Description: Selective/Open Laura Instrument: Curette Bleeding: Minimum Hemostasis Achieved: Pressure Response to Treatment: Procedure was tolerated well Level of Consciousness (Post- Awake and Alert procedure): Post Debridement Measurements of Total Laura Length: (cm) 0.2 Width: (cm) 0.2 Depth: (cm) 0.2 Volume: (cm) 0.006 Character of Laura/Ulcer Post Debridement: Stable Post Procedure Diagnosis Same as Pre-procedure Electronic Signature(s) Signed: 08/24/2021 11:21:42 AM By: Kalman Shan DO Signed: 08/24/2021 4:36:53 PM By: Dolan Amen RN Entered By: Dolan Amen on 08/24/2021 10:50:49 Laura Santiago ([id number]) -------------------------------------------------------------------------------- HPI Details Patient Name: Laura Santiago. Date of Service: 08/24/2021 10:00 AM Medical Record Number: [id number] Patient Account Number: [id number] Date of Birth/Sex: 27-Nov-1946 (75 y.o. F) Treating RN: Dolan Amen Primary Care Provider: SYSTEM, PCP Other Clinician: Referring Provider: Randa Evens Treating Provider/Extender: Yaakov Guthrie in Treatment: 7 History of Present Illness HPI Description: Admission 7/27 Laura Santiago is a 75 year old female with a past medical history of ADHD, metastatic breast  cancer, stage IV chronic kidney disease, history of DVT on Xarelto and chronic venous insufficiency that presents to the clinic for a chronic Santiago lower extremity Laura. She recently moved to [facility] 4 days ago. She was being followed by Laura care center in [name]. She reports a 10-year history of wounds to her Santiago lower extremity that eventually do heal with debridement and compression therapy. She states that the current Laura reopened 4 months ago and she is using Vaseline and Coban. She denies signs of infection. 8/3; patient presents for 1 week follow-up. She reports no issues or complaints today. She states she had vascular studies done in the last week. She denies signs of infection. She brought her little service dog with her today. 8/17; patient presents for follow-up. She has missed her last clinic appointment. She states she took the wrap off and attempted to rewrap her leg. She is having difficulty with transportation. She has her service dog with her today. Overall she feels well and reports improvement in Laura healing. She denies signs of infection. She reports owning an old Velcro wrap compression and has this at her living facility 9/14; patient presents for follow-up. Patient states that over the  past 2 to 3 weeks she developed toe wounds to her right foot. She attributes this to tight fitting shoes. She subsequently developed cellulitis in the right leg and has been treated by doxycycline by her oncologist. She reports improvement in symptoms however continues to have some redness and swelling to this leg. To the Santiago lower extremity patient has been having her wraps changed with home health twice weekly. She states that the [facility] is not helping control the drainage. Other than that she has no issues or complaints today. She denies signs of infection to the Santiago lower extremity. Electronic Signature(s) Signed: 08/24/2021 11:21:42 AM By: Kalman Shan  DO Entered By: Kalman Shan on 08/24/2021 11:13:21 Laura Santiago ([id number]) -------------------------------------------------------------------------------- Physical Exam Details Patient Name: Laura Santiago, Laura Santiago. Date of Service: 08/24/2021 10:00 AM Medical Record Number: [id number] Patient Account Number: [id number] Date of Birth/Sex: 1946-03-18 (75 y.o. F) Treating RN: Dolan Amen Primary Care Provider: SYSTEM, PCP Other Clinician: Referring Provider: Randa Evens Treating Provider/Extender: Yaakov Guthrie in Treatment: 7 Constitutional . Cardiovascular . Psychiatric . Notes Santiago lower extremity: Open wounds to the Laura Santiago aspect with granulation tissue, slough and fibrinous tissue. Green drainage on dressing Right lower extremity: 3+ pitting edema to the knee. Slight erythema. No increased warmth or drainage noted. To the second and third toe there is an open Laura on the dorsal aspect with nonviable tissue. To the plantar aspect there is an open Laura with nonviable tissue to the right great toe. Electronic Signature(s) Signed: 08/24/2021 11:21:42 AM By: Kalman Shan DO Entered By: Kalman Shan on 08/24/2021 11:14:48 Laura Santiago ([id number]) -------------------------------------------------------------------------------- Physician Orders Details Patient Name: Laura Santiago. Date of Service: 08/24/2021 10:00 AM Medical Record Number: [id number] Patient Account Number: [id number] Date of Birth/Sex: Feb 13, 1946 (75 y.o. F) Treating RN: Dolan Amen Primary Care Provider: SYSTEM, PCP Other Clinician: Referring Provider: Randa Evens Treating Provider/Extender: Yaakov Guthrie in Treatment: 7 Verbal / Phone Orders: No Diagnosis Coding Follow-up Appointments o Return Appointment in 1 week. [place] for Laura care. May utilize formulary equivalent dressing for Laura  treatment orders unless otherwise specified. Home Health Nurse may visit PRN to address patientos Laura care needs. o Scheduled days for dressing changes to be completed; exception, patient has scheduled Laura care visit that day. o **Please direct any NON-Laura related issues/requests for orders to patient's Primary Care Physician. **If current dressing causes regression in Laura condition, may D/C ordered dressing product/s and apply Normal Saline Moist Dressing daily until next [place] or Other MD appointment. **Notify [facility] of regression in Laura condition at [phone]. Bathing/ Shower/ Hygiene o May shower with Laura dressing protected with water repellent cover or cast protector. o No tub bath. Medications-Please add to medication list. o P.O. Antibiotics - Start keflex Laura Treatment Laura Santiago, Laura Santiago Cleanser: Soap and Water 2 x Per Week/30 Days Discharge Instructions: Gently cleanse Laura with antibacterial soap, rinse and pat dry prior to dressing wounds Peri-Laura Care: Desitin Maximum Strength Ointment 4 (oz) 2 x Per Week/30 Days Discharge Instructions: Apply on macerated area Topical: Gentamicin 2 x Per Week/30 Days Discharge Instructions: Apply as directed by provider. Primary Dressing: Silvercel 4 1/4x 4 1/4 (in/in) 2 x Per Week/30 Days Discharge Instructions: Apply Silvercel 4 1/4x 4 1/4 (in/in) as instructed Secondary Dressing: ABD Pad 5x9 (in/in) (Generic) 2 x Per Week/30 Days Discharge Instructions: Cover with ABD pad  Compression Wrap: Profore Lite LF 3 Multilayer Compression Bandaging System 2 x Per Week/30 Days Discharge Instructions: Apply 3 multi-layer wrap as prescribed. Laura #2 - Toe Great Laura Laterality: Right Cleanser: Laura Cleanser 1 x Per Day/30 Days Discharge Instructions: Wash your hands with soap and water. Remove old dressing, discard into plastic bag and place into  trash. Cleanse the Laura with Laura Cleanser prior to applying a clean dressing using gauze sponges, not tissues or cotton balls. Do not scrub or use excessive force. Pat dry using gauze sponges, not tissue or cotton balls. Primary Dressing: Silvercel Small 2x2 (in/in) 1 x Per Day/30 Days Discharge Instructions: Apply Silvercel Small 2x2 (in/in) as instructed Secondary Dressing: Gauze 1 x Per Day/30 Days Discharge Instructions: Cover with dry gauze Secured With: 72M Medipore H Soft Cloth Surgical Tape, 2x2 (in/yd) 1 x Per Day/30 Days Discharge Instructions: Secure dressing Laura Santiago, DUMIRE. ([id number]) Laura #3 - Toe Second Laura Laterality: Right Cleanser: Laura Cleanser 3 x Per Week/30 Days Discharge Instructions: Wash your hands with soap and water. Remove old dressing, discard into plastic bag and place into trash. Cleanse the Laura with Laura Cleanser prior to applying a clean dressing using gauze sponges, not tissues or cotton balls. Do not scrub or use excessive force. Pat dry using gauze sponges, not tissue or cotton balls. Primary Dressing: Hydrofera Blue Ready Transfer Foam, 2.5x2.5 (in/in) 3 x Per Week/30 Days Discharge Instructions: Apply Hydrofera Blue Ready to Laura bed as directed Secondary Dressing: Gauze 3 x Per Week/30 Days Discharge Instructions: Apply dry gauze to secure Secured With: 72M Medipore H Soft Cloth Surgical Tape, 2x2 (in/yd) 3 x Per Week/30 Days Discharge Instructions: Secure dressing Laura #4 - Toe Third Laura Laterality: Right Cleanser: Laura Cleanser 3 x Per Week/30 Days Discharge Instructions: Wash your hands with soap and water. Remove old dressing, discard into plastic bag and place into trash. Cleanse the Laura with Laura Cleanser prior to applying a clean dressing using gauze sponges, not tissues or cotton balls. Do not scrub or use excessive force. Pat dry using gauze sponges, not tissue or cotton balls. Primary Dressing: Hydrofera Blue Ready Transfer  Foam, 2.5x2.5 (in/in) 3 x Per Week/30 Days Discharge Instructions: Apply Hydrofera Blue Ready to Laura bed as directed Secondary Dressing: Gauze 3 x Per Week/30 Days Discharge Instructions: Apply dry gauze to secure Secured With: 72M Medipore H Soft Cloth Surgical Tape, 2x2 (in/yd) 3 x Per Week/30 Days Discharge Instructions: Secure dressing Laboratory o Bacteria identified in Laura by Culture (MICRO) - Santiago lower leg oooo LOINC Code: 7893-8 oooo Convenience Name: Laura culture routine Radiology o X-ray, foot - right foot, special focus on great toe Patient Medications Allergies: Celebrex Notifications Medication Indication Start End Keflex 08/24/2021 DOSE 1 - oral 500 mg capsule - 1 capsule oral BID for 10 days Electronic Signature(s) Signed: 08/24/2021 11:20:49 AM By: Kalman Shan DO Entered By: Kalman Shan on 08/24/2021 11:20:49 Laura Santiago ([id number]) -------------------------------------------------------------------------------- Problem List Details Patient Name: Laura Santiago. Date of Service: 08/24/2021 10:00 AM Medical Record Number: [id number] Patient Account Number: [id number] Date of Birth/Sex: 04/25/1946 (75 y.o. F) Treating RN: Dolan Amen Primary Care Provider: SYSTEM, PCP Other Clinician: Referring Provider: Randa Evens Treating Provider/Extender: Yaakov Guthrie in Treatment: 7 Active Problems ICD-10 Encounter Code Description Active Date MDM Diagnosis S81.802D Unspecified open Laura, Santiago lower leg, subsequent encounter 07/13/2021 No Yes S91.101A Unspecified open Laura of right great toe without damage to nail, initial 08/24/2021 No Yes encounter S91.104A Unspecified open Laura  of right lesser toe(s) without damage to nail, 08/24/2021 No Yes initial encounter I87.2 Venous insufficiency (chronic) (peripheral) 07/06/2021 No Yes 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 07/06/2021 No Yes I10 Essential (primary) hypertension  07/06/2021 No Yes Z79.01 Long term (current) use of anticoagulants 07/06/2021 No Yes Inactive Problems ICD-10 Code Description Active Date Inactive Date S81.802A Unspecified open Laura, Santiago lower leg, initial encounter 07/06/2021 07/06/2021 Resolved Problems Electronic Signature(s) Signed: 08/24/2021 11:21:42 AM By: Kalman Shan DO Signed: 08/24/2021 4:36:53 PM By: Dolan Amen RN Entered By: Dolan Amen on 08/24/2021 11:12:53 Laura Santiago ([id number]) -------------------------------------------------------------------------------- Progress Note Details Patient Name: Laura Santiago. Date of Service: 08/24/2021 10:00 AM Medical Record Number: [id number] Patient Account Number: [id number] Date of Birth/Sex: January 29, 1946 (75 y.o. F) Treating RN: Dolan Amen Primary Care Provider: SYSTEM, PCP Other Clinician: Referring Provider: Randa Evens Treating Provider/Extender: Yaakov Guthrie in Treatment: 7 Subjective Chief Complaint Information obtained from Patient Santiago lower extremity Laura Right toe wounds History of Present Illness (HPI) Admission 7/27 Ms. Deion Forgue is a 75 year old female with a past medical history of ADHD, metastatic breast cancer, stage IV chronic kidney disease, history of DVT on Xarelto and chronic venous insufficiency that presents to the clinic for a chronic Santiago lower extremity Laura. She recently moved to [facility] 4 days ago. She was being followed by Laura care center in [name]. She reports a 10-year history of wounds to her Santiago lower extremity that eventually do heal with debridement and compression therapy. She states that the current Laura reopened 4 months ago and she is using Vaseline and Coban. She denies signs of infection. 8/3; patient presents for 1 week follow-up. She reports no issues or complaints today. She states she had vascular studies done in the last week. She denies signs of infection. She brought her  little service dog with her today. 8/17; patient presents for follow-up. She has missed her last clinic appointment. She states she took the wrap off and attempted to rewrap her leg. She is having difficulty with transportation. She has her service dog with her today. Overall she feels well and reports improvement in Laura healing. She denies signs of infection. She reports owning an old Velcro wrap compression and has this at her living facility 9/14; patient presents for follow-up. Patient states that over the past 2 to 3 weeks she developed toe wounds to her right foot. She attributes this to tight fitting shoes. She subsequently developed cellulitis in the right leg and has been treated by doxycycline by her oncologist. She reports improvement in symptoms however continues to have some redness and swelling to this leg. To the Santiago lower extremity patient has been having her wraps changed with home health twice weekly. She states that the [facility] is not helping control the drainage. Other than that she has no issues or complaints today. She denies signs of infection to the Santiago lower extremity. Patient History Information obtained from Patient. Social History Never smoker. Medical History Eyes Denies history of Cataracts, Glaucoma, Optic Neuritis Ear/Nose/Mouth/Throat Denies history of Chronic sinus problems/congestion, Middle ear problems Hematologic/Lymphatic Denies history of Anemia, Hemophilia, Human Immunodeficiency Virus, Lymphedema, Sickle Cell Disease Respiratory Denies history of Aspiration, Asthma, Chronic Obstructive Pulmonary Disease (COPD), Pneumothorax, Sleep Apnea, Tuberculosis Cardiovascular Patient has history of Hypertension Denies history of Angina, Arrhythmia, Congestive Heart Failure, Coronary Artery Disease, Deep Vein Thrombosis, Hypotension, Myocardial Infarction, Peripheral Arterial Disease, Peripheral Venous Disease, Phlebitis,  Vasculitis Gastrointestinal Denies history of Cirrhosis , Colitis, Crohn  s, Hepatitis A, Hepatitis B, Hepatitis C Endocrine Denies history of Type I Diabetes, Type II Diabetes Genitourinary Denies history of End Stage Renal Disease Immunological Denies history of Lupus Erythematosus, Raynaud s, Scleroderma Integumentary (Skin) Denies history of History of Burn, History of pressure wounds Musculoskeletal Patient has history of Osteoarthritis Denies history of Gout, Rheumatoid Arthritis, Osteomyelitis Oncologic Patient has history of Received Chemotherapy, Received Radiation Medical And Surgical History Notes Oncologic breast cancer Laura Santiago, B[place]KL[place]. ([id number]) Objective Constitutional Vitals Time Taken: 10:11 AM, Height: 66 in, Weight: 153 lbs, BMI: 24.7, Temperature: 99.0 F, Pulse: 83 bpm, Respiratory Rate: 18 breaths/min, Blood Pressure: 109/63 mmHg. General Notes: Santiago lower extremity: Open wounds to the Laura Santiago aspect with granulation tissue, slough and fibrinous tissue. Green drainage on dressing Right lower extremity: 3+ pitting edema to the knee. Slight erythema. No increased warmth or drainage noted. To the second and third toe there is an open Laura on the dorsal aspect with nonviable tissue. To the plantar aspect there is an open Laura with nonviable tissue to the right great toe. Integumentary (Hair, Skin) Laura #1 status is Open. Original cause of Laura was Gradually Appeared. The date acquired was: 04/06/2021. The Laura has been in treatment 7 weeks. The Laura is located on the Santiago,Laura Santiago Lower Leg. The Laura measures 9cm length x 9.5cm width x 0.2cm depth; 67.152cm^2 area and 13.43cm^3 volume. There is Fat Layer (Subcutaneous Tissue) exposed. There is no tunneling or undermining noted. There is a large amount of purulent drainage noted. There is medium (34-66%) red granulation within the Laura bed. There is a medium (34-66%) amount of necrotic tissue within the Laura  bed. Laura #2 status is Open. Original cause of Laura was Gradually Appeared. The date acquired was: 08/24/2021. The Laura is located on the Right Toe Great. The Laura measures 0.4cm length x 0.5cm width x 0.4cm depth; 0.157cm^2 area and 0.063cm^3 volume. There is Fat Layer (Subcutaneous Tissue) exposed. There is no tunneling or undermining noted. There is a medium amount of serosanguineous drainage noted. There is medium (34-66%) red granulation within the Laura bed. There is a medium (34-66%) amount of necrotic tissue within the Laura bed including Adherent Slough. Laura #3 status is Open. Original cause of Laura was Footwear Injury. The date acquired was: 08/24/2021. The Laura is located on the Right Toe Second. The Laura measures 0.3cm length x 0.3cm width x 0.2cm depth; 0.071cm^2 area and 0.014cm^3 volume. There is Fat Layer (Subcutaneous Tissue) exposed. There is no tunneling or undermining noted. There is a medium amount of serous drainage noted. There is large (67-100%) pink granulation within the Laura bed. There is no necrotic tissue within the Laura bed. Laura #4 status is Open. Original cause of Laura was Footwear Injury. The date acquired was: 08/24/2021. The Laura is located on the Right Toe Third. The Laura measures 0.2cm length x 0.2cm width x 0.2cm depth; 0.031cm^2 area and 0.006cm^3 volume. There is Fat Layer (Subcutaneous Tissue) exposed. There is no tunneling or undermining noted. There is a medium amount of serous drainage noted. There is large (67-100%) pink granulation within the Laura bed. There is no necrotic tissue within the Laura bed. Assessment Active Problems ICD-10 Unspecified open Laura, Santiago lower leg, subsequent encounter Unspecified open Laura of right great toe without damage to nail, initial encounter Unspecified open Laura of right lesser toe(s) without damage to nail, initial encounter Venous insufficiency (chronic) (peripheral) Secondary malignant neoplasm of  breast Essential (primary) hypertension Long term (current) use of anticoagulants Patient's  Santiago lower extremity shows improvement and size in appearance since last clinic visit. I debrided nonviable tissue. Since there was green drainage on The dressing per intake nurse. I obtained a Laura culture. I recommended gentamicin in office today under the wrap. Since she continues to have drainage not managed by [facility] I will switch this to silver alginate. We will continue with the compression wrap. Patient has now developed open wounds to her right foot. I debrided nonviable tissue. I recommended silver alginate to the right great toe Laura. I recommended Hydrofera Blue to the dorsal aspect of the second and third toe. Her toe wounds are likely caused by tight fitting shoes and she has hammertoes. She now is wearing open toed shoes. I also gave her a course of Keflex to see if this helps continued symptoms Suggestive of cellulitis. 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 There was a Excisional Skin/Subcutaneous Tissue Debridement with a total area of 85.5 sq cm performed by Kalman Shan, MD. With the following instrument(s): Curette to remove Viable and Non-Viable tissue/material. Material removed includes Subcutaneous Tissue and Slough and. A time out was conducted at 10:40, prior to the start of the procedure. A Minimum amount of bleeding was controlled with Pressure. The procedure was tolerated well. Post Debridement Measurements: 9cm length x 9.5cm width x 0.2cm depth; 13.43cm^3 volume. Character of Laura/Ulcer Post Debridement is stable. Severity of Tissue Post Debridement is: Limited to breakdown of skin. 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 There was a Excisional Skin/Subcutaneous Tissue Debridement with a total area of 1 sq cm performed by Kalman Shan, MD. With the following instrument(s): Curette to remove Viable and Non-Viable tissue/material. Material removed includes Callus and Subcutaneous Tissue and. A time out was conducted at 10:40, prior to the start of the procedure. A Minimum amount of bleeding was controlled with Pressure. The procedure was tolerated well. Post Debridement Measurements: 0.4cm length x 0.5cm width x 0.4cm depth; 0.063cm^3 volume. Character of Laura/Ulcer Post Debridement is stable. 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 There was a Selective/Open Laura Skin/Epidermis Debridement with a total area of 0.09 sq cm performed by Kalman Shan, MD. With the following instrument(s): Curette to remove Non-Viable tissue/material. Material removed includes Skin: Epidermis. A time out was conducted at 10:40, prior to the start of the procedure. A Minimum amount of bleeding was controlled with Pressure. The procedure was tolerated well. Post Debridement Measurements: 0.3cm length x 0.3cm width x 0.2cm depth; 0.014cm^3 volume. Character of Laura/Ulcer Post Debridement is stable. 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 There was a Selective/Open Laura Skin/Epidermis Debridement with a total area of 0.04 sq cm performed by Kalman Shan, MD. With the following instrument(s): Curette to remove Non-Viable tissue/material. Material removed includes Skin: Epidermis. A time out was conducted at 10:40, prior to the start of the procedure. A Minimum amount of bleeding was controlled with  Pressure. The procedure was tolerated well. Post Debridement Measurements: 0.2cm length x 0.2cm width x 0.2cm depth; 0.006cm^3  volume. Character of Laura/Ulcer Post Debridement is stable. Post procedure Diagnosis Laura #4: Same as Pre-Procedure Plan Follow-up Appointments: Return Appointment in 1 week. Home Health: [place]: - [facility] for Laura care. May utilize formulary equivalent dressing for Laura treatment orders unless otherwise specified. Home Health Nurse may visit PRN to address patient s Laura care needs. Scheduled days for dressing changes to be completed; exception, patient has scheduled Laura care visit that day. **Please direct any NON-Laura related issues/requests for orders to patient's Primary Care Physician. **If current dressing causes regression in Laura condition, may D/C ordered dressing product/s and apply Normal Saline Moist Dressing daily until next [place] or Other MD appointment. **Notify [facility] of regression in Laura condition at [phone]. Bathing/ Shower/ Hygiene: May shower with Laura dressing protected with water repellent cover or cast protector. No tub bath. Medications-Please add to medication list.: P.O. Antibiotics - Start keflex Laboratory ordered were: Laura culture routine - Santiago lower leg Radiology ordered were: X-ray, foot - right foot, special focus on great toe The following medication(s) was prescribed: Keflex oral 500 mg capsule 1 1 capsule oral BID for 10 days starting 08/24/2021 Laura #1: - Lower Leg Laura Laterality: Santiago, Laura Santiago Cleanser: Soap and Water 2 x Per Week/30 Days Discharge Instructions: Gently cleanse Laura with antibacterial soap, rinse and pat dry prior to dressing wounds Peri-Laura Care: Desitin Maximum Strength Ointment 4 (oz) 2 x Per Week/30 Days Discharge Instructions: Apply on macerated area Topical: Gentamicin 2 x Per Week/30 Days Discharge Instructions:  Apply as directed by provider. JAMILA, SLATTEN ([id number]) Primary Dressing: Silvercel 4 1/4x 4 1/4 (in/in) 2 x Per Week/30 Days Discharge Instructions: Apply Silvercel 4 1/4x 4 1/4 (in/in) as instructed Secondary Dressing: ABD Pad 5x9 (in/in) (Generic) 2 x Per Week/30 Days Discharge Instructions: Cover with ABD pad Compression Wrap: Profore Lite LF 3 Multilayer Compression Bandaging System 2 x Per Week/30 Days Discharge Instructions: Apply 3 multi-layer wrap as prescribed. Laura #2: - Toe Great Laura Laterality: Right Cleanser: Laura Cleanser 1 x Per Day/30 Days Discharge Instructions: Wash your hands with soap and water. Remove old dressing, discard into plastic bag and place into trash. Cleanse the Laura with Laura Cleanser prior to applying a clean dressing using gauze sponges, not tissues or cotton balls. Do not scrub or use excessive force. Pat dry using gauze sponges, not tissue or cotton balls. Primary Dressing: Silvercel Small 2x2 (in/in) 1 x Per Day/30 Days Discharge Instructions: Apply Silvercel Small 2x2 (in/in) as instructed Secondary Dressing: Gauze 1 x Per Day/30 Days Discharge Instructions: Cover with dry gauze Secured With: 58M Medipore H Soft Cloth Surgical Tape, 2x2 (in/yd) 1 x Per Day/30 Days Discharge Instructions: Secure dressing Laura #3: - Toe Second Laura Laterality: Right Cleanser: Laura Cleanser 3 x Per Week/30 Days Discharge Instructions: Wash your hands with soap and water. Remove old dressing, discard into plastic bag and place into trash. Cleanse the Laura with Laura Cleanser prior to applying a clean dressing using gauze sponges, not tissues or cotton balls. Do not scrub or use excessive force. Pat dry using gauze sponges, not tissue or cotton balls. Primary Dressing: Hydrofera Blue Ready Transfer Foam, 2.5x2.5 (in/in) 3 x Per Week/30 Days Discharge Instructions: Apply Hydrofera Blue Ready to Laura bed as directed Secondary Dressing: Gauze 3 x Per Week/30  Days Discharge Instructions: Apply dry gauze to secure Secured With: 58M Medipore H Soft Cloth Surgical Tape, 2x2 (in/yd) 3 x Per Week/30 Days Discharge Instructions:  Secure dressing Laura #4: - Toe Third Laura Laterality: Right Cleanser: Laura Cleanser 3 x Per Week/30 Days Discharge Instructions: Wash your hands with soap and water. Remove old dressing, discard into plastic bag and place into trash. Cleanse the Laura with Laura Cleanser prior to applying a clean dressing using gauze sponges, not tissues or cotton balls. Do not scrub or use excessive force. Pat dry using gauze sponges, not tissue or cotton balls. Primary Dressing: Hydrofera Blue Ready Transfer Foam, 2.5x2.5 (in/in) 3 x Per Week/30 Days Discharge Instructions: Apply Hydrofera Blue Ready to Laura bed as directed Secondary Dressing: Gauze 3 x Per Week/30 Days Discharge Instructions: Apply dry gauze to secure Secured With: 57M Medipore H Soft Cloth Surgical Tape, 2x2 (in/yd) 3 x Per Week/30 Days Discharge Instructions: Secure dressing 1. In office sharp debridement 2. Gentamicin cream and silver alginate under compression to the Santiago lower extremity 3. Hydrofera Blue and silver alginate to the toes on the right foot 4. Laura culture 5. Keflex 6. Follow-up in 1 week Electronic Signature(s) Signed: 08/24/2021 11:21:42 AM By: Kalman Shan DO Entered By: Kalman Shan on 08/24/2021 11:21:11 Laura Santiago ([id number]) -------------------------------------------------------------------------------- ROS/PFSH Details Patient Name: Laura Santiago. Date of Service: 08/24/2021 10:00 AM Medical Record Number: [id number] Patient Account Number: [id number] Date of Birth/Sex: 11-12-1946 (75 y.o. F) Treating RN: Dolan Amen Primary Care Provider: SYSTEM, PCP Other Clinician: Referring Provider: Randa Evens Treating Provider/Extender: Yaakov Guthrie in Treatment: 7 Information Obtained From Patient Eyes Medical  History: Negative for: Cataracts; Glaucoma; Optic Neuritis Ear/Nose/Mouth/Throat Medical History: Negative for: Chronic sinus problems/congestion; Middle ear problems Hematologic/Lymphatic Medical History: Negative for: Anemia; Hemophilia; Human Immunodeficiency Virus; Lymphedema; Sickle Cell Disease Respiratory Medical History: Negative for: Aspiration; Asthma; Chronic Obstructive Pulmonary Disease (COPD); Pneumothorax; Sleep Apnea; Tuberculosis Cardiovascular Medical History: Positive for: Hypertension Negative for: Angina; Arrhythmia; Congestive Heart Failure; Coronary Artery Disease; Deep Vein Thrombosis; Hypotension; Myocardial Infarction; Peripheral Arterial Disease; Peripheral Venous Disease; Phlebitis; Vasculitis Gastrointestinal Medical History: Negative for: Cirrhosis ; Colitis; Crohnos; Hepatitis A; Hepatitis B; Hepatitis C Endocrine Medical History: Negative for: Type I Diabetes; Type II Diabetes Genitourinary Medical History: Negative for: End Stage Renal Disease Immunological Medical History: Negative for: Lupus Erythematosus; Raynaudos; Scleroderma Integumentary (Skin) Medical History: Negative for: History of Burn; History of pressure wounds Musculoskeletal Medical History: Positive for: Osteoarthritis AIRIEL, OBLINGER ([id number]) Negative for: Gout; Rheumatoid Arthritis; Osteomyelitis Oncologic Medical History: Positive for: Received Chemotherapy; Received Radiation Past Medical History Notes: breast cancer Immunizations Pneumococcal Vaccine: Received Pneumococcal Vaccination: No Implantable Devices None Family and Social History Never smoker Electronic Signature(s) Signed: 08/24/2021 11:21:42 AM By: Kalman Shan DO Signed: 08/24/2021 4:36:53 PM By: Dolan Amen RN Entered By: Kalman Shan on 08/24/2021 11:13:29 SANAIYA, WELLIVER ([id number]) -------------------------------------------------------------------------------- [place]  Details Patient Name: Laura Santiago. Date of Service: 08/24/2021 Medical Record Number: [id number] Patient Account Number: [id number] Date of Birth/Sex: 05-05-1946 (75 y.o. F) Treating RN: Dolan Amen Primary Care Provider: SYSTEM, PCP Other Clinician: Referring Provider: Randa Evens Treating Provider/Extender: Yaakov Guthrie in Treatment: 7 Diagnosis Coding ICD-10 Codes Code Description S81.802D Unspecified open Laura, Santiago lower leg, subsequent encounter S91.101A Unspecified open Laura of right great toe without damage to nail, initial encounter S91.104A Unspecified open Laura of right lesser toe(s) without damage to nail, initial encounter I87.2 Venous insufficiency (chronic) (peripheral) 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 I10 Essential (primary) hypertension Z79.01 Long term (current) use of anticoagulants L03.115 Cellulitis of right lower limb Facility Procedures CPT4 Code: 99833825 Description: 05397 - DEB SUBQ  TISSUE 20 SQ CM/< Modifier: Quantity: 1 CPT4 Code: Description: ICD-10 Diagnosis Description D64.383K Unspecified open Laura of right great toe without damage to nail, initial Modifier: encounter Quantity: CPT4 Code: 18403754 Description: 36067 - DEB SUBQ TISS EA ADDL 20CM Modifier: Quantity: 4 CPT4 Code: Description: ICD-10 Diagnosis Description S81.802D Unspecified open Laura, Santiago lower leg, subsequent encounter Modifier: Quantity: CPT4 Code: 70340352 Description: 48185 - DEBRIDE Laura 1ST 20 SQ CM OR < Modifier: Quantity: 1 CPT4 Code: Description: ICD-10 Diagnosis Description S91.104A Unspecified open Laura of right lesser toe(s) without damage to nail, init Modifier: ial encounter Quantity: Physician Procedures CPT4 Code: 9093112 Description: 16244 - WC PHYS LEVEL 4 - EST PT Modifier: Quantity: 1 CPT4 Code: Description: ICD-10 Diagnosis Description S91.104A Unspecified open Laura of right lesser toe(s) without damage to  nail, init S91.101A Unspecified open Laura of right great toe without damage to nail, initial L03.115 Cellulitis of right lower limb Modifier: ial encounter encounter Quantity: CPT4 Code: 6950722 Description: 11042 - WC PHYS SUBQ TISS 20 SQ CM Modifier: Quantity: 1 CPT4 Code: Description: ICD-10 Diagnosis Description S91.101A Unspecified open Laura of right great toe without damage to nail, initial Modifier: encounter Quantity: CPT4 Code: 5750518 Description: 33582 - WC PHYS SUBQ TISS EA ADDL 20 CM Modifier: Quantity: 4 CPT4 Code: Description: ICD-10 Diagnosis Description S81.802D Unspecified open Laura, Santiago lower leg, subsequent encounter Modifier: Quantity: CPT4 Code: 5189842 Description: 10312 - WC PHYS DEBR WO ANESTH 20 SQ CM Modifier: Quantity: 1 CPT4 Code: Saran, BONN Description: ICD-10 Diagnosis Description S91.104A Unspecified open Laura of right lesser toe(s) without damage to nail, init IE M. ([id number]) Modifier: ial encounter Quantity: Electronic Signature(s) Signed: 08/24/2021 11:21:42 AM By: Kalman Shan DO Entered By: Kalman Shan on 08/24/2021 11:21:18

## 2021-08-26 ENCOUNTER — Inpatient Hospital Stay (HOSPITAL_BASED_OUTPATIENT_CLINIC_OR_DEPARTMENT_OTHER): Payer: Medicare Other | Admitting: Oncology

## 2021-08-26 ENCOUNTER — Inpatient Hospital Stay: Payer: Medicare Other

## 2021-08-26 ENCOUNTER — Other Ambulatory Visit: Payer: Self-pay | Admitting: *Deleted

## 2021-08-26 ENCOUNTER — Telehealth: Payer: Self-pay

## 2021-08-26 ENCOUNTER — Encounter: Payer: Self-pay | Admitting: Oncology

## 2021-08-26 VITALS — BP 123/73 | HR 74 | Temp 97.9°F | Resp 16 | Ht 64.0 in | Wt 159.1 lb

## 2021-08-26 DIAGNOSIS — G893 Neoplasm related pain (acute) (chronic): Secondary | ICD-10-CM | POA: Diagnosis not present

## 2021-08-26 DIAGNOSIS — Z5112 Encounter for antineoplastic immunotherapy: Secondary | ICD-10-CM

## 2021-08-26 DIAGNOSIS — R948 Abnormal results of function studies of other organs and systems: Secondary | ICD-10-CM | POA: Diagnosis not present

## 2021-08-26 DIAGNOSIS — C50919 Malignant neoplasm of unspecified site of unspecified female breast: Secondary | ICD-10-CM

## 2021-08-26 DIAGNOSIS — Z5111 Encounter for antineoplastic chemotherapy: Secondary | ICD-10-CM

## 2021-08-26 LAB — COMPREHENSIVE METABOLIC PANEL
ALT: 13 U/L (ref 0–44)
AST: 17 U/L (ref 15–41)
Albumin: 3.7 g/dL (ref 3.5–5.0)
Alkaline Phosphatase: 66 U/L (ref 38–126)
Anion gap: 7 (ref 5–15)
BUN: 23 mg/dL (ref 8–23)
CO2: 26 mmol/L (ref 22–32)
Calcium: 8.5 mg/dL — ABNORMAL LOW (ref 8.9–10.3)
Chloride: 104 mmol/L (ref 98–111)
Creatinine, Ser: 1.06 mg/dL — ABNORMAL HIGH (ref 0.44–1.00)
GFR, Estimated: 54 mL/min — ABNORMAL LOW (ref >60)
Glucose, Bld: 112 mg/dL — ABNORMAL HIGH (ref 70–99)
Potassium: 4.6 mmol/L (ref 3.5–5.1)
Sodium: 137 mmol/L (ref 135–145)
Total Bilirubin: 0.5 mg/dL (ref 0.3–1.2)
Total Protein: 6.6 g/dL (ref 6.5–8.1)

## 2021-08-26 LAB — CBC WITH DIFFERENTIAL/PLATELET
Abs Immature Granulocytes: 0.05 10*3/uL (ref 0.00–0.07)
Basophils Absolute: 0 10*3/uL (ref 0.0–0.1)
Basophils Relative: 0 %
Eosinophils Absolute: 0.1 10*3/uL (ref 0.0–0.5)
Eosinophils Relative: 2 %
HCT: 29.4 % — ABNORMAL LOW (ref 36.0–46.0)
Hemoglobin: 9.2 g/dL — ABNORMAL LOW (ref 12.0–15.0)
Immature Granulocytes: 1 %
Lymphocytes Relative: 13 %
Lymphs Abs: 0.7 10*3/uL (ref 0.7–4.0)
MCH: 30.3 pg (ref 26.0–34.0)
MCHC: 31.3 g/dL (ref 30.0–36.0)
MCV: 96.7 fL (ref 80.0–100.0)
Monocytes Absolute: 0.3 10*3/uL (ref 0.1–1.0)
Monocytes Relative: 7 %
Neutro Abs: 3.8 10*3/uL (ref 1.7–7.7)
Neutrophils Relative %: 77 %
Platelets: 289 10*3/uL (ref 150–400)
RBC: 3.04 MIL/uL — ABNORMAL LOW (ref 3.87–5.11)
RDW: 16.5 % — ABNORMAL HIGH (ref 11.5–15.5)
WBC: 4.9 10*3/uL (ref 4.0–10.5)
nRBC: 0 % (ref 0.0–0.2)

## 2021-08-26 LAB — AEROBIC CULTURE W GRAM STAIN (SUPERFICIAL SPECIMEN)

## 2021-08-26 MED ORDER — SODIUM CHLORIDE 0.9 % IV SOLN
Freq: Once | INTRAVENOUS | Status: AC
  Filled 2021-08-26: qty 250

## 2021-08-26 MED ORDER — SODIUM CHLORIDE 0.9% FLUSH
10.0000 mL | INTRAVENOUS | Status: DC | PRN
  Filled 2021-08-26: qty 10

## 2021-08-26 MED ORDER — SODIUM CHLORIDE 0.9 % IV SOLN
65.0000 mg/m2 | Freq: Once | INTRAVENOUS | Status: AC
  Administered 2021-08-26: 114 mg via INTRAVENOUS
  Filled 2021-08-26: qty 19

## 2021-08-26 MED ORDER — HEPARIN SOD (PORK) LOCK FLUSH 100 UNIT/ML IV SOLN
500.0000 [IU] | Freq: Once | INTRAVENOUS | Status: AC | PRN
  Administered 2021-08-26: 500 [IU]
  Filled 2021-08-26: qty 5

## 2021-08-26 MED ORDER — FENTANYL 12 MCG/HR TD PT72: 1.0000 | MEDICATED_PATCH | TRANSDERMAL | 0 refills | Status: DC

## 2021-08-26 MED ORDER — SODIUM CHLORIDE 0.9 % IV SOLN
10.0000 mg | Freq: Once | INTRAVENOUS | Status: AC
  Administered 2021-08-26: 10 mg via INTRAVENOUS
  Filled 2021-08-26: qty 10

## 2021-08-26 MED ORDER — HEPARIN SOD (PORK) LOCK FLUSH 100 UNIT/ML IV SOLN
INTRAVENOUS | Status: AC
  Filled 2021-08-26: qty 5

## 2021-08-26 MED ORDER — FAMOTIDINE 20 MG IN NS 100 ML IVPB
20.0000 mg | Freq: Once | INTRAVENOUS | Status: AC
  Administered 2021-08-26: 20 mg via INTRAVENOUS
  Filled 2021-08-26: qty 20

## 2021-08-26 MED ORDER — DIPHENHYDRAMINE HCL 50 MG/ML IJ SOLN
50.0000 mg | Freq: Once | INTRAMUSCULAR | Status: AC
  Administered 2021-08-26: 50 mg via INTRAVENOUS
  Filled 2021-08-26: qty 1

## 2021-08-26 MED ORDER — OXYCODONE HCL 10 MG PO TABS: 10.0000 mg | ORAL_TABLET | ORAL | 0 refills | Status: DC | PRN

## 2021-08-26 NOTE — Progress Notes (Signed)
Pt tolerated all infusions well today with no problems or complaints.  Pt left infusion suite stable and ambulatory with her walker.

## 2021-08-26 NOTE — Progress Notes (Signed)
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  She had a screening mammogram in September 2014 which showed 2.1 x 2.3 x 1.8 cm irregular mass in her right breast.  It was ER 10% positive PR negative and HER2 positive +3.  She received neoadjuvant chemotherapy with Taxol Herceptin and Perjeta for 4 cycles followed by dose dense AC/Herceptin x4 which she completed in March 2015.  She had a right lumpectomy on 04/03/2014 which showed scant residual invasive ductal carcinoma [id number].  She completed 1 year of adjuvant Herceptin chemotherapy and also completed adjuvant radiation treatment.  She was recommended anastrozole which she took on and off starting November 2015 and stopped sometime in 2020.   She was then hospitalized with neck pain and was found to have lytic lesions involving C5-C6 with pathological vertebral fractures.  She underwent radiation treatment to this area.  Image guided biopsy of the L1 vertebral body showed metastatic carcinoma consistent with breast origin ER 10% PR 0% and HER2 amplified ratio 5.1 average HER2 signal number per cell 15.0 average CEP 17 signals number per cell 3.0.  Baseline echocardiogram on 05/02/2021 showed a normal EF of 62% she was recommended Taxol Herceptin and Perjeta which she received for 2 cycles at [facility] until June 10, 2021    She has chronic pain from her bone metastases for which she is currently on oxycodone 10 mg every 4 hours as needed and 12 mcg fentanyl patch.  She was seeing pain clinic when she was living in [place].   Patient has also been on Zometa when she was in [place] but she does have some ongoing dental issues.  She has received Xgeva in the past as well.  Her last Rivka Barbara was in July 2021.  Last PET scan was on 03/21/2021 which showed diffuse osseous metastatic disease involving the head neck chest abdomen and pelvis and spine.  Left lung apex hypermetabolic nodule and multiple hypermetabolic liver lesions concerning for disease involvement.  Enlarged hypermetabolic left inguinal lymph nodes along with hypermetabolic external iliac and left supraclavicular lymph nodes   Patient is now moved to [place] to be close to her daughter.  She lives in an independent living.    Interval history-right lower extremity continues to have swelling and redness despite finishing doxycycline and changing over to Keflex.  Wounds over her right toes are improving.  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  She has not had any recent falls.  ECOG PS- 2 Pain scale- 3 Opioid associated constipation- no  Review of systems- Review of Systems  Constitutional:  Positive for malaise/fatigue. Negative for chills, fever and weight loss.  HENT:  Negative for congestion, ear discharge and nosebleeds.   Eyes:  Negative for blurred vision.  Respiratory:  Negative for cough, hemoptysis, sputum production, shortness of breath and wheezing.   Cardiovascular:  Negative for chest pain, palpitations, orthopnea and claudication.  Gastrointestinal:  Negative for abdominal pain, blood in stool, constipation, diarrhea, heartburn, melena, nausea and vomiting.  Genitourinary:  Negative for dysuria, flank pain, frequency, hematuria and urgency.  Musculoskeletal:  Negative for back  pain, joint pain and myalgias.  Skin:  Negative for rash.  Neurological:  Positive for sensory change (Peripheral neuropathy). Negative for dizziness, tingling, focal weakness, seizures, weakness and headaches.  Endo/Heme/Allergies:  Does not bruise/bleed easily.  Psychiatric/Behavioral:  Negative for depression and suicidal ideas. The patient does not have insomnia.       Allergies  Allergen Reactions   Corticosteroids Other (See Comments)    Pt trf from [name] and per primary md for her cancer tx. Notes that it causes agitation intolerance   Sulfa Antibiotics Other (See Comments)    Pt moved from [name] and in MD notes she has allergy but we do not know reactions when taking the drug   Celebrex [Celecoxib] Rash     Past Medical History:  Diagnosis Date   ADHD (attention deficit hyperactivity disorder)    in [place], no date on md note   Anemia    IDA 11/26/2019, Anemia in stage 4 chronic kidney disease ([place]) 09/03/2020   Arthritis    osteoarthritis right knee 09/30/2014   Breast cancer ([place]) 10/05/2013   in [place], [place] -, Her 2 is 3+   Cancer related pain 03/28/2021   in [name], md notes spine mets   DVT of lower extremity, bilateral ([place]) 03/30/2014   in [name]   Generalized muscle weakness 03/31/2016   in [name]   GERD (gastroesophageal reflux disease) 05/15/2013   per md in [name]   Hyperparathyroidism, secondary ([place]) 04/25/2018   in [name]   Hypertension 02/20/2016   info from MD in [facility]   Lumbar compression fracture ([place]) 03/18/2021   in [place] 02/05/2015   in [name]   Metabolic acidosis 01/75/1025   in [place]   Metabolic syndrome 85/27/7824   in [name]   Osteopenia after menopause 03/29/2016   in [place] of both eyes 09/30/2012   per md in [name] where pt. 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   in [name]   Stasis ulcer of left lower extremity ([place]) 03/29/2016   in [name]     Past Surgical History:  Procedure Laterality Date    CESAREAN SECTION     unknown   fibroid removed  N/A    in [place] - unknown date   IR FLUORO GUIDE CV LINE LEFT  07/27/2021   IR PORT REPAIR CENTRAL VENOUS ACCESS DEVICE Left    In [place] N/A 02/25/2014   in [name]   ovary removed      unknown   [place] CATARACT EXTRACAP,INSERT LENS Bilateral  Bilateral 09/05/2012   in [place]     unknown    LUMPECTOMY Right 04/03/2014   in [place]    Social History   Socioeconomic History   Marital status: Divorced    Spouse name: Not on file   Number of children: Not on file   Years of education: Not on file   Highest education level: Not on file  Occupational History   Occupation: retired Teacher, music    Comment: In [place]  Tobacco Use   Smoking status: Never   Smokeless tobacco: Never  Vaping Use   Vaping Use: Never  used  Substance and Sexual Activity   Alcohol use: Not Currently   Drug use: Never   Sexual activity: Not Currently  Other Topics Concern   Not on file  Social History Narrative   Not on file   Social Determinants of Health   Financial Resource Strain: Not on file  Food Insecurity: Not on file  Transportation Needs: Not on file  Physical Activity: Not on file  Stress: Not on file  Social Connections: Not on file  Intimate Partner Violence: Not on file    Family History  Problem Relation Age of Onset   Pancreatic cancer Mother    Stroke Father    Diabetes Father    Hypertension Father    Heart disease Father    Skin cancer Father    Varicose Veins Father    Skin cancer Brother    Cancer - Prostate Brother      Current Outpatient Medications:    amphetamine-dextroamphetamine (ADDERALL XR) 30 MG 24 hr capsule, Take 1 capsule (30 mg total) by mouth daily., Disp: 30 capsule, Rfl: 0   Calcium 200 MG TABS, Take 1 tablet by mouth daily., Disp: , Rfl:    gabapentin (NEURONTIN) 400 MG capsule, Take 400 mg by mouth 2 (two) times daily., Disp: , Rfl:    lisinopril (ZESTRIL) 20 MG  tablet, Take 20 mg by mouth daily., Disp: , Rfl:    rivaroxaban (XARELTO) 20 MG TABS tablet, Take 1 tablet (20 mg total) by mouth daily with supper., Disp: 30 tablet, Rfl: 3   zolpidem (AMBIEN) 5 MG tablet, Take 5 mg by mouth at bedtime as needed for sleep., Disp: , Rfl:    acetaminophen (TYLENOL) 500 MG tablet, Take 500 mg by mouth every 6 (six) hours as needed for mild pain ($RemoveBe'500mg'nyTMquXMv$  to $R'1000mg'KU$  Q6 hours PRN). (Patient not taking: No sig reported), Disp: , Rfl:    fentaNYL (DURAGESIC) 12 MCG/HR, Place 1 patch onto the skin every 3 (three) days., Disp: 10 patch, Rfl: 0   lidocaine-prilocaine (EMLA) cream, Apply to affected area once (Patient not taking: Reported on 08/26/2021), Disp: 30 g, Rfl: 3   Multiple Vitamin (MULTIVITAMIN ADULT PO), Take 1 tablet by mouth daily. (Patient not taking: Reported on 08/26/2021), Disp: , Rfl:    ondansetron (ZOFRAN) 8 MG tablet, Take 1 tablet (8 mg total) by mouth 2 (two) times daily as needed (Nausea or vomiting). (Patient not taking: Reported on 08/26/2021), Disp: 30 tablet, Rfl: 1   ondansetron (ZOFRAN) 8 MG tablet, Take by mouth. (Patient not taking: Reported on 08/26/2021), Disp: , Rfl:    Oxycodone HCl 10 MG TABS, Take 1 tablet (10 mg total) by mouth every 4 (four) hours as needed., Disp: 84 tablet, Rfl: 0   prochlorperazine (COMPAZINE) 10 MG tablet, Take 1 tablet (10 mg total) by mouth every 6 (six) hours as needed (Nausea or vomiting). (Patient not taking: No sig reported), Disp: 30 tablet, Rfl: 1 No current facility-administered medications for this visit.  Facility-Administered Medications Ordered in Other Visits:    heparin lock flush 100 UNIT/ML injection, , , ,    sodium chloride flush (NS) 0.9 % injection 10 mL, 10 mL, Intracatheter, PRN, [name], MD  Physical exam:  Vitals:   08/26/21 0925  BP: 123/73  Pulse: 74  Resp: 16  Temp: 97.9 F (36.6 C)  TempSrc: Tympanic  Weight: 159 lb 1.6 oz (72.2 kg)  Height: $Remove'5\' 4"'VXdTFhZ$  (1.626 m)   Physical  Exam Constitutional:  Comments: Ambulates with a walker.   Cardiovascular:     Rate and Rhythm: Normal rate and regular rhythm.     Heart sounds: Normal heart sounds.  Pulmonary:     Effort: Pulmonary effort is normal.     Breath sounds: Normal breath sounds.  Abdominal:     General: Bowel sounds are normal.     Palpations: Abdomen is soft.  Musculoskeletal:     Comments: Chronic left LLE wounds with wrap in place. RLE appears chronically swollen and red. Toes wounds are improving  Skin:    General: Skin is warm and dry.  Neurological:     Mental Status: She is alert and oriented to person, place, and time.     CMP Latest Ref Rng & Units 08/26/2021  Glucose 70 - 99 mg/dL 112(H)  BUN 8 - 23 mg/dL 23  Creatinine 0.44 - 1.00 mg/dL 1.06(H)  Sodium 135 - 145 mmol/L 137  Potassium 3.5 - 5.1 mmol/L 4.6  Chloride 98 - 111 mmol/L 104  CO2 22 - 32 mmol/L 26  Calcium 8.9 - 10.3 mg/dL 8.5(L)  Total Protein 6.5 - 8.1 g/dL 6.6  Total Bilirubin 0.3 - 1.2 mg/dL 0.5  Alkaline Phos 38 - 126 U/L 66  AST 15 - 41 U/L 17  ALT 0 - 44 U/L 13   CBC Latest Ref Rng & Units 08/26/2021  WBC 4.0 - 10.5 K/uL 4.9  Hemoglobin 12.0 - 15.0 g/dL 9.2(L)  Hematocrit 36.0 - 46.0 % 29.4(L)  Platelets 150 - 400 K/uL 289    No images are attached to the encounter.  CT Abdomen Pelvis Wo Contrast  Result Date: 08/04/2021 CLINICAL DATA:  Breast cancer staging EXAM: CT ABDOMEN AND PELVIS WITHOUT CONTRAST TECHNIQUE: Multidetector CT imaging of the abdomen and pelvis was performed following the standard protocol without IV contrast. Oral enteric contrast was administered. COMPARISON:  None. FINDINGS: Lower chest: No acute abnormality. Hepatobiliary: No solid liver abnormality is seen. No gallstones, gallbladder wall thickening, or biliary dilatation. Pancreas: Unremarkable. No pancreatic ductal dilatation or surrounding inflammatory changes. Spleen: Normal in size without significant abnormality. Adrenals/Urinary  Tract: Adrenal glands are unremarkable. Kidneys are normal, without renal calculi, solid lesion, or hydronephrosis. Bladder is unremarkable. Stomach/Bowel: Stomach is within normal limits. Appendix appears normal. No evidence of bowel wall thickening, distention, or inflammatory changes. Large burden of stool throughout the colon and rectum. Vascular/Lymphatic: Aortic atherosclerosis. Multiple enlarged bilateral iliac, inguinal, and pelvic sidewall lymph nodes, largest left inguinal nodes measuring up to 2.5 x 1.5 cm (series 2, image 81). Prominent, although not pathologically enlarged lymph nodes in the upper abdomen. Reproductive: No mass or other significant abnormality. Other: No abdominal wall hernia or abnormality. No abdominopelvic ascites. Musculoskeletal: Numerous sclerotic osseous lesions throughout the included skeleton, particularly of T12 through L2 vertebral bodies. IMPRESSION: 1. Multiple enlarged bilateral iliac, inguinal, and pelvic sidewall lymph nodes. Prominent, although not pathologically enlarged lymph nodes in the upper abdomen. Findings are highly concerning for nodal metastatic disease in the setting of known metastatic breast cancer, lymphoma an alternate differential consideration. 2. No obvious evidence of solid organ metastatic disease in the abdomen or pelvis, assessment significantly limited by noncontrast CT. 3. Numerous sclerotic osseous metastatic lesions throughout the included skeleton. Aortic Atherosclerosis (ICD10-I70.0). Electronically Signed   By: Eddie Candle M.D.   On: 08/04/2021 16:14   NM Bone Scan Whole Body  Result Date: 08/05/2021 CLINICAL DATA:  Breast cancer, surveillance breast cancer. Bilateral arm and shoulder pain. EXAM: NUCLEAR MEDICINE WHOLE  BODY BONE SCAN TECHNIQUE: Whole body anterior and posterior images were obtained approximately 3 hours after intravenous injection of radiopharmaceutical. RADIOPHARMACEUTICALS:  21.24 mCi Technetium-5m MDP IV  COMPARISON:  None. Findings are correlated with CT examination of the abdomen pelvis performed at 8:30 a.m. FINDINGS: There is focal uptake of radiotracer within the frontal bone, cervical spine, proximal right humerus, medial right clavicle, left glenoid, sternum, thoracolumbar spine particularly T12 and L1, and the right superior pubic ramus in keeping with osseous metastatic disease. More curvilinear uptake within the right humeral head anteriorly is more suggestive of degenerative change involving the glenohumeral articulation, however, metastatic disease is not excluded. Uptake within the right knee and foot is likely degenerative in nature. Normal soft tissue distribution. Normal uptake and excretion within the kidneys. IMPRESSION: Widespread osseous metastatic disease. Dedicated radiographs of the cervical spine and right humerus is recommended to assess the degree of osseous involvement assess for potential pathologic fracture. CT imaging of the chest may be helpful for both confirmation of metastatic disease as well as response assessment purposes. Electronically Signed   By: Fidela Salisbury M.D.   On: 08/05/2021 00:06   DG Foot Complete Right  Result Date: 08/25/2021 CLINICAL DATA:  Nonhealing wound to the RIGHT lower extremity. Wound over great toe for proximally 10 years. EXAM: RIGHT FOOT COMPLETE - 3+ VIEW COMPARISON:  August 04, 2021.  Bone scan evaluation. FINDINGS: Dressing over the great toe. Small amount of gas along the margin of the great toe, medial soft tissues likely the site of reported ulceration. Soft tissue swelling in the area. No destructive bony changes. No acute bone process with mild midfoot degenerative changes. IMPRESSION: Soft tissue swelling and small amount of gas along the margin of the great toe, likely the site of reported ulceration. No destructive bony changes, no acute bone findings. Electronically Signed   By: Zetta Bills M.D.   On: 08/25/2021 10:45    ECHOCARDIOGRAM COMPLETE  Result Date: 08/01/2021    ECHOCARDIOGRAM REPORT   Patient Name:   NETA UPADHYAY Date of Exam: 08/01/2021 Medical Rec #:  [id number]        Height:       64.0 in Accession #:    [id number]       Weight:       160.8 lb Date of Birth:  03-12-1946        BSA:          1.783 m Patient Age:    10 years         BP:           172/84 mmHg Patient Gender: F                HR:           95 bpm. Exam Location:  [facility] Procedure: 2D Echo, Color Doppler, Cardiac Doppler and Strain Analysis Indications:     Z09 Chemo  History:         Patient has no prior history of Echocardiogram examinations.                  Risk Factors:Hypertension. Metastic breast cancer; Bone                  metastases; Abnormal ECG.  Sonographer:     Charmayne Sheer Referring Phys:  [id number] [place] Anna Ikeisha Blumberg Diagnosing Phys: Kathlyn Sacramento MD  Sonographer Comments: Suboptimal subcostal window. Global longitudinal strain was attempted. IMPRESSIONS  1. Left ventricular ejection fraction,  by estimation, is 60 to 65%. The left ventricle has normal function. The left ventricle has no regional wall motion abnormalities. There is mild left ventricular hypertrophy. Left ventricular diastolic parameters were normal. The average left ventricular global longitudinal strain is -17.7 %. The global longitudinal strain is normal.  2. Right ventricular systolic function is normal. The right ventricular size is normal. Tricuspid regurgitation signal is inadequate for assessing PA pressure.  3. Left atrial size was mildly dilated.  4. The mitral valve is normal in structure. Mild mitral valve regurgitation. No evidence of mitral stenosis.  5. The aortic valve is normal in structure. Aortic valve regurgitation is not visualized. Mild to moderate aortic valve sclerosis/calcification is present, without any evidence of aortic stenosis.  6. The inferior vena cava is normal in size with <50% respiratory variability, suggesting right atrial pressure of 8  mmHg. FINDINGS  Left Ventricle: Left ventricular ejection fraction, by estimation, is 60 to 65%. The left ventricle has normal function. The left ventricle has no regional wall motion abnormalities. The average left ventricular global longitudinal strain is -17.7 %. The global longitudinal strain is normal. The left ventricular internal cavity size was normal in size. There is mild left ventricular hypertrophy. Left ventricular diastolic parameters were normal. Right Ventricle: The right ventricular size is normal. No increase in right ventricular wall thickness. Right ventricular systolic function is normal. Tricuspid regurgitation signal is inadequate for assessing PA pressure. Left Atrium: Left atrial size was mildly dilated. Right Atrium: Right atrial size was normal in size. Pericardium: There is no evidence of pericardial effusion. Mitral Valve: The mitral valve is normal in structure. Mild mitral valve regurgitation. No evidence of mitral valve stenosis. MV peak gradient, 7.3 mmHg. The mean mitral valve gradient is 3.0 mmHg. Tricuspid Valve: The tricuspid valve is normal in structure. Tricuspid valve regurgitation is trivial. No evidence of tricuspid stenosis. Aortic Valve: The aortic valve is normal in structure. Aortic valve regurgitation is not visualized. Mild to moderate aortic valve sclerosis/calcification is present, without any evidence of aortic stenosis. Aortic valve mean gradient measures 8.0 mmHg. Aortic valve peak gradient measures 14.7 mmHg. Aortic valve area, by VTI measures 2.49 cm. Pulmonic Valve: The pulmonic valve was normal in structure. Pulmonic valve regurgitation is not visualized. No evidence of pulmonic stenosis. Aorta: The aortic root is normal in size and structure. Venous: The inferior vena cava is normal in size with less than 50% respiratory variability, suggesting right atrial pressure of 8 mmHg. IAS/Shunts: No atrial level shunt detected by color flow Doppler.  LEFT VENTRICLE  PLAX 2D LVIDd:         3.80 cm  Diastology LVIDs:         2.30 cm  LV e' medial:    8.05 cm/s LV PW:         1.10 cm  LV E/e' medial:  11.8 LV IVS:        1.10 cm  LV e' lateral:   11.20 cm/s LVOT diam:     2.10 cm  LV E/e' lateral: 8.5 LV SV:         90 LV SV Index:   51       2D Longitudinal Strain LVOT Area:     3.46 cm 2D Strain GLS Avg:     -17.7 %  RIGHT VENTRICLE RV Basal diam:  3.00 cm TAPSE (M-mode): 2.7 cm LEFT ATRIUM             Index  RIGHT ATRIUM           Index LA diam:        4.40 cm 2.47 cm/m  RA Area:     13.00 cm LA Vol (A2C):   84.5 ml 47.39 ml/m RA Volume:   30.10 ml  16.88 ml/m LA Vol (A4C):   45.4 ml 25.46 ml/m LA Biplane Vol: 65.4 ml 36.68 ml/m  AORTIC VALVE                    PULMONIC VALVE AV Area (Vmax):    2.55 cm     PV Vmax:       1.44 m/s AV Area (Vmean):   2.36 cm     PV Vmean:      103.000 cm/s AV Area (VTI):     2.49 cm     PV VTI:        0.276 m AV Vmax:           191.50 cm/s  PV Peak grad:  8.3 mmHg AV Vmean:          134.000 cm/s PV Mean grad:  5.0 mmHg AV VTI:            0.362 m AV Peak Grad:      14.7 mmHg AV Mean Grad:      8.0 mmHg LVOT Vmax:         141.00 cm/s LVOT Vmean:        91.300 cm/s LVOT VTI:          0.260 m LVOT/AV VTI ratio: 0.72  AORTA Ao Root diam: 3.10 cm MITRAL VALVE MV Area (PHT): 5.42 cm    SHUNTS MV Area VTI:   3.07 cm    Systemic VTI:  0.26 m MV Peak grad:  7.3 mmHg    Systemic Diam: 2.10 cm MV Mean grad:  3.0 mmHg MV Vmax:       1.35 m/s MV Vmean:      87.3 cm/s MV Decel Time: 140 msec MV E velocity: 95.10 cm/s MV A velocity: 97.70 cm/s MV E/A ratio:  0.97 Kathlyn Sacramento MD Electronically signed by Kathlyn Sacramento MD Signature Date/Time: 08/01/2021/4:33:57 PM    Final      Assessment and plan- Patient is a 75 y.o. female with ER weakly +10%, PR negative and HER2 positive breast cancer diagnosed with 2014 now with multiple areas of metastatic disease.  She is here for on treatment assessment prior to cycle 3-day 8 of Taxol  chemotherapy  Patient reports some worsening neuropathy over the last 2 weeks.  We discussed that Taxol can cause worsening neuropathy and therefore I would recommend doing Taxol 2 weeks on 1 week off at this time.  If her neuropathy gets worse we will consider discontinuing Taxol altogether especially after about 6 to 9 months of treatment.  We can consider adding antiestrogen to Herceptin and Perjeta since she was weakly ER positive.  We did obtain outside scans which have not been compared as of now and hopefully we will have the addendum on her recent scans after we get them compared to her last scan from May 2022.  She will not receive any chemotherapy next week and I will see her back in 2 weeks for Taxol Herceptin and Perjeta.  Right lower extremity ultrasound was not scheduled and I will go ahead and schedule that at this time and she also has a pending referral to ID for her right lower extremity  erythema and possible cellulitis.  She has been on antibiotics for over 2 weeks now.  X-ray did not show any evidence of osteomyelitis.  Bone scan showed multiple areas of bone metastases includingFracture involving that area and also received radiation there.  There is also concern for involvement of the right humerus and I will proceed with a plain x-ray of the right humerus at this time as well.  Cervical spine which we are already aware of when she did have a pathological continue fentanyl and as needed oxycodone for neoplasm related pain.   I will see her back in 2 weeks and she will receive Taxol Herceptin Perjeta as well as Zometa for bone metastases.   Visit Diagnosis 1. Metastatic breast cancer ([place])   2. Abnormal radionuclide bone scan   3. Encounter for antineoplastic chemotherapy   4. Encounter for monoclonal antibody treatment for malignancy      Dr. Randa Evens, MD, MPH [facility] at [facility] [phone] 08/26/2021 1:56 PM

## 2021-08-26 NOTE — Patient Instructions (Signed)
[facility]  Discharge Instructions: Thank you for choosing [facility] to provide your oncology and hematology care.  If you have a lab appointment with the Cancer Center, please go directly to the Cancer Center and check in at the registration area.  Wear comfortable clothing and clothing appropriate for easy access to any Portacath or PICC line.   We strive to give you quality time with your provider. You may need to reschedule your appointment if you arrive late (15 or more minutes).  Arriving late affects you and other patients whose appointments are after yours.  Also, if you miss three or more appointments without notifying the office, you may be dismissed from the clinic at the provider's discretion.      For prescription refill requests, have your pharmacy contact our office and allow 72 hours for refills to be completed.    Today you received the following chemotherapy and/or immunotherapy agents taxol       To help prevent nausea and vomiting after your treatment, we encourage you to take your nausea medication as directed.  BELOW ARE SYMPTOMS THAT SHOULD BE REPORTED IMMEDIATELY: *FEVER GREATER THAN 100.4 F (38 C) OR HIGHER *CHILLS OR SWEATING *NAUSEA AND VOMITING THAT IS NOT CONTROLLED WITH YOUR NAUSEA MEDICATION *UNUSUAL SHORTNESS OF BREATH *UNUSUAL BRUISING OR BLEEDING *URINARY PROBLEMS (pain or burning when urinating, or frequent urination) *BOWEL PROBLEMS (unusual diarrhea, constipation, pain near the anus) TENDERNESS IN MOUTH AND THROAT WITH OR WITHOUT PRESENCE OF ULCERS (sore throat, sores in mouth, or a toothache) UNUSUAL RASH, SWELLING OR PAIN  UNUSUAL VAGINAL DISCHARGE OR ITCHING   Items with * indicate a potential emergency and should be followed up as soon as possible or go to the Emergency Department if any problems should occur.  Please show the CHEMOTHERAPY ALERT CARD or IMMUNOTHERAPY ALERT CARD at check-in to  the Emergency Department and triage nurse.  Should you have questions after your visit or need to cancel or reschedule your appointment, please contact [facility]  [phone] and follow the prompts.  Office hours are 8:00 a.m. to 4:30 p.m. Monday - Friday. Please note that voicemails left after 4:00 p.m. may not be returned until the following business day.  We are closed weekends and major holidays. You have access to a nurse at all times for urgent questions. Please call the main number to the clinic [phone] and follow the prompts.  For any non-urgent questions, you may also contact your provider using MyChart. We now offer e-Visits for anyone 18 and older to request care online for non-urgent symptoms. For details visit [email or web address].   Also download the MyChart app! Go to the app store, search "MyChart", open the app, select [facility], and log in with your MyChart username and password.  Due to Covid, a mask is required upon entering the hospital/clinic. If you do not have a mask, one will be given to you upon arrival. For doctor visits, patients may have 1 support person aged 18 or older with them. For treatment visits, patients cannot have anyone with them due to current Covid guidelines and our immunocompromised population.   Paclitaxel injection What is this medication? PACLITAXEL (PAK li TAX el) is a chemotherapy drug. It targets fast dividing cells, like cancer cells, and causes these cells to die. This medicine is used to treat ovarian cancer, breast cancer, lung cancer, Kaposi's sarcoma, and other cancers. This medicine may be used for other purposes;   your health care provider or pharmacist if you have questions. COMMON BRAND NAME(S): Onxol, Taxol What should I tell my care team before I take this medication? They need to know if you have any of these conditions: history of irregular heartbeat liver disease low blood counts,  like low white cell, platelet, or red cell counts lung or breathing disease, like asthma tingling of the fingers or toes, or other nerve disorder an unusual or allergic reaction to paclitaxel, alcohol, polyoxyethylated castor oil, other chemotherapy, other medicines, foods, dyes, or preservatives pregnant or trying to get pregnant breast-feeding How should I use this medication? This drug is given as an infusion into a vein. It is administered in a hospital or clinic by a specially trained health care professional. Talk to your pediatrician regarding the use of this medicine in children. Special care may be needed. Overdosage: If you think you have taken too much of this medicine contact a poison control center or emergency room at once. NOTE: This medicine is only for you. Do not share this medicine with others. What if I miss a dose? It is important not to miss your dose. Call your doctor or health care professional if you are unable to keep an appointment. What may interact with this medication? Do not take this medicine with any of the following medications: live virus vaccines This medicine may also interact with the following medications: antiviral medicines for hepatitis, HIV or AIDS certain antibiotics like erythromycin and clarithromycin certain medicines for fungal infections like ketoconazole and itraconazole certain medicines for seizures like carbamazepine, phenobarbital, phenytoin gemfibrozil nefazodone rifampin St. John's wort This list may not describe all possible interactions. Give your health care provider a list of all the medicines, herbs, non-prescription drugs, or dietary supplements you use. Also tell them if you smoke, drink alcohol, or use illegal drugs. Some items may interact with your medicine. What should I watch for while using this medication? Your condition will be monitored carefully while you are receiving this medicine. You will need important blood work  done while you are taking this medicine. This medicine can cause serious allergic reactions. To reduce your risk you will need to take other medicine(s) before treatment with this medicine. If you experience allergic reactions like skin rash, itching or hives, swelling of the face, lips, or tongue, tell your doctor or health care professional right away. In some cases, you may be given additional medicines to help with side effects. Follow all directions for their use. This drug may make you feel generally unwell. This is not uncommon, as chemotherapy can affect healthy cells as well as cancer cells. Report any side effects. Continue your course of treatment even though you feel ill unless your doctor tells you to stop. Call your doctor or health care professional for advice if you get a fever, chills or sore throat, or other symptoms of a cold or flu. Do not treat yourself. This drug decreases your body's ability to fight infections. Try to avoid being around people who are sick. This medicine may increase your risk to bruise or bleed. Call your doctor or health care professional if you notice any unusual bleeding. Be careful brushing and flossing your teeth or using a toothpick because you may get an infection or bleed more easily. If you have any dental work done, tell your dentist you are receiving this medicine. Avoid taking products that contain aspirin, acetaminophen, ibuprofen, naproxen, or ketoprofen unless instructed by your doctor. These medicines may hide a  fever. Do not become pregnant while taking this medicine. Women should inform their doctor if they wish to become pregnant or think they might be pregnant. There is a potential for serious side effects to an unborn child. Talk to your health care professional or pharmacist for more information. Do not breast-feed an infant while taking this medicine. Men are advised not to father a child while receiving this medicine. This product may  contain alcohol. Ask your pharmacist or healthcare provider if this medicine contains alcohol. Be sure to tell all healthcare providers you are taking this medicine. Certain medicines, like metronidazole and disulfiram, can cause an unpleasant reaction when taken with alcohol. The reaction includes flushing, headache, nausea, vomiting, sweating, and increased thirst. The reaction can last from 30 minutes to several hours. What side effects may I notice from receiving this medication? Side effects that you should report to your doctor or health care professional as soon as possible: allergic reactions like skin rash, itching or hives, swelling of the face, lips, or tongue breathing problems changes in vision fast, irregular heartbeat high or low blood pressure mouth sores pain, tingling, numbness in the hands or feet signs of decreased platelets or bleeding - bruising, pinpoint red spots on the skin, black, tarry stools, blood in the urine signs of decreased red blood cells - unusually weak or tired, feeling faint or lightheaded, falls signs of infection - fever or chills, cough, sore throat, pain or difficulty passing urine signs and symptoms of liver injury like dark yellow or brown urine; general ill feeling or flu-like symptoms; light-colored stools; loss of appetite; nausea; right upper belly pain; unusually weak or tired; yellowing of the eyes or skin swelling of the ankles, feet, hands unusually slow heartbeat Side effects that usually do not require medical attention (report to your doctor or health care professional if they continue or are bothersome): diarrhea hair loss loss of appetite muscle or joint pain nausea, vomiting pain, redness, or irritation at site where injected tiredness This list may not describe all possible side effects. Call your doctor for medical advice about side effects. You may report side effects to FDA at 1-800-FDA-1088. Where should I keep my  medication? This drug is given in a hospital or clinic and will not be stored at home. NOTE: This sheet is a summary. It may not cover all possible information. If you have questions about this medicine, talk to your doctor, pharmacist, or health care provider.  2022 Elsevier/Gold Standard (2019-10-29 13:37:23)

## 2021-08-26 NOTE — Telephone Encounter (Signed)
Contacted Infectious Diseases: scheduled appt for pt on 08/29/2021 at 3:45pm. Will write down for pt and inform her of appointment while in infusion.

## 2021-08-29 ENCOUNTER — Telehealth: Payer: Self-pay

## 2021-08-29 ENCOUNTER — Ambulatory Visit (INDEPENDENT_AMBULATORY_CARE_PROVIDER_SITE_OTHER): Payer: Medicare Other | Admitting: Infectious Diseases

## 2021-08-29 ENCOUNTER — Other Ambulatory Visit: Payer: Self-pay

## 2021-08-29 VITALS — BP 145/74 | HR 90 | Temp 98.2°F

## 2021-08-29 DIAGNOSIS — I83009 Varicose veins of unspecified lower extremity with ulcer of unspecified site: Secondary | ICD-10-CM | POA: Insufficient documentation

## 2021-08-29 DIAGNOSIS — B353 Tinea pedis: Secondary | ICD-10-CM | POA: Diagnosis not present

## 2021-08-29 DIAGNOSIS — L03115 Cellulitis of right lower limb: Secondary | ICD-10-CM | POA: Diagnosis not present

## 2021-08-29 DIAGNOSIS — I83028 Varicose veins of left lower extremity with ulcer other part of lower leg: Secondary | ICD-10-CM

## 2021-08-29 DIAGNOSIS — L97822 Non-pressure chronic ulcer of other part of left lower leg with fat layer exposed: Secondary | ICD-10-CM | POA: Diagnosis not present

## 2021-08-29 MED ORDER — TERBINAFINE HCL 1 % EX CREA: 1.0000 "application " | TOPICAL_CREAM | Freq: Two times a day (BID) | CUTANEOUS | 5 refills | Status: DC

## 2021-08-29 NOTE — Progress Notes (Addendum)
[facility]                                                             [place], [place], [place], [address]                                                                  Phn. 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 ([place]) - Primary   Relevant Orders   C-reactive protein   Sedimentation rate   Ambulatory referral to Vascular Surgery   VAS [place] LOWER EXTREMITY VENOUS REFLUX   Tinea pedis of both feet   Relevant Medications   cephALEXin (KEFLEX) 500 MG capsule   terbinafine (LAMISIL AT) 1 % cream     Other   Cellulitis of right lower extremity    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 Patient verbalized understanding of the plan. ____________________________________________________________________________________________________________________  HPI: 75 Y O Female with PMH of Metastatic Breast ca on chemotherapy, bilateral DVT of LE, venous stasis complicated with chronic ulcer in the left  lower extremity referred from wound care center due to concerns for rt foot cellulitis. Patient is accompanied by her daughter. She has recentlly moved from [place] to [place] to stay closer to her family. She tells me that the ulcer in the left lower leg has been present for more than 10 years. She has been following with wound care center previously in [name] and currently in [place]. She tells me she has also seen vascular surgery while she was in [name] but has not established care with Vascular sx after moving out.  She started having increased swelling in the rt foot approx 3-4 weeks ago with some redness. She thinks it started after wearing a tight fitting shoes. Denies any associated fevers, chills, sweats. Denies any nausea, vomiting  or systemic symptoms. She tells me she has completed 1 week of Doxycycline prescribed on Oncology fu on 9/2 and is on Day 5 of cephalexin now. She feels the redness has somewhat improved with the abtx otherwise not much difference. The ulcer in left leg would heal with debridement and compression stockings however, it would come back again. She denis smoking, alcohol and illicit drug use. Vascular US ABI 07/11/21 WNL. TTE 08/01/21 with normal EF. RT foot Xray 08/25/21 S]soft tissue swelling and small amount of gas along the margin of the great toe, likely the site of reported ulceration. No destructive bony changes, no acute bone findings.  ROS: Constitutional: Negative for fever, chills, activity change, appetite change, fatigue and unexpected weight change.  Respiratory: Negative for cough, shortness of breath Cardiovascular: Negative for chest pain, palpitations, bilateral leg swelling   Gastrointestinal: Negative for nausea, vomiting, abdominal pain, diarrhea/constipation, .  Genitourinary: Negative for dysuria, hematuria, flank pain Musculoskeletal: Negative for myalgias, arthralgia, back pain, joint swelling, arthralgias Skin: Negative for rashes, Neurological:  Negative for weakness, dizziness or headache  Past Medical History:  Diagnosis Date   ADHD (attention deficit hyperactivity disorder)    in [place], no date on md note   Anemia    IDA 11/26/2019, Anemia in stage 4 chronic kidney disease ([place]) 09/03/2020   Arthritis    osteoarthritis right knee 09/30/2014   Breast cancer ([place]) 10/05/2013   in [place] +, [place] -, Her 2 is 3+   Cancer related pain 03/28/2021   in [name], md notes spine mets   DVT of lower extremity, bilateral ([place]) 03/30/2014   in [name]   Generalized muscle weakness 03/31/2016   in [name]   GERD (gastroesophageal reflux disease) 05/15/2013   per md in [name]   Hyperparathyroidism, secondary ([place]) 04/25/2018   in [name]   Hypertension 02/20/2016   info from MD in [name]   Lumbar compression fracture ([place]) 03/18/2021   in [place] loss 02/05/2015   in [name]   Metabolic acidosis 52/77/8242   in [place]   Metabolic syndrome 35/36/1443   in [name]   Osteopenia after menopause 03/29/2016   in [place] of both eyes 09/30/2012   per md in [name] where pt. 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   in [name]   Stasis ulcer of left lower extremity ([place]) 03/29/2016   in [name]   Past Surgical History:  Procedure Laterality Date   CESAREAN SECTION     unknown   fibroid removed  N/A    in [place] - [place] date   IR FLUORO GUIDE CV LINE LEFT  07/27/2021   IR PORT REPAIR CENTRAL VENOUS ACCESS DEVICE Left    In [place] N/A 02/25/2014   in [name]   ovary removed      unknown   REMV CATARACT EXTRACAP,INSERT LENS Bilateral  Bilateral 09/05/2012   in [place]     unknown    LUMPECTOMY Right 04/03/2014   in [place]   Current Outpatient Medications on File Prior to Visit  Medication Sig Dispense Refill   amphetamine-dextroamphetamine (ADDERALL XR) 30 MG 24 hr capsule Take 1 capsule (30 mg total) by mouth daily. 30 capsule 0   Calcium 200 MG TABS Take 1 tablet by mouth daily.      cephALEXin (KEFLEX) 500 MG capsule Take 500 mg by mouth 2 (two) times daily.     fentaNYL (DURAGESIC) 12  MCG/HR Place 1 patch onto the skin every 3 (three) days. 10 patch 0   gabapentin (NEURONTIN) 400 MG capsule Take 400 mg by mouth 2 (two) times daily.     lisinopril (ZESTRIL) 20 MG tablet Take 20 mg by mouth daily.     ondansetron (ZOFRAN) 8 MG tablet Take by mouth.     Oxycodone HCl 10 MG TABS Take 1 tablet (10 mg total) by mouth every 4 (four) hours as needed. 84 tablet 0   rivaroxaban (XARELTO) 20 MG TABS tablet Take 1 tablet (20 mg total) by mouth daily with supper. 30 tablet 3   zolpidem (AMBIEN) 5 MG tablet Take 5 mg by mouth at bedtime as needed for sleep.     acetaminophen (TYLENOL) 500 MG tablet Take 500 mg by mouth every 6 (six) hours as needed for mild pain ($RemoveBe'500mg'qmFAmyZCB$  to $R'1000mg'nx$  Q6 hours PRN). (Patient not taking: No sig reported)     lidocaine-prilocaine (EMLA) cream Apply to affected area once (Patient not taking: No sig reported) 30 g 3   Multiple Vitamin (MULTIVITAMIN ADULT PO) Take 1 tablet by mouth daily. (Patient not taking: No sig reported)     ondansetron (ZOFRAN) 8 MG tablet Take 1 tablet (8 mg total) by mouth 2 (two) times daily as needed (Nausea or vomiting). (Patient not taking: No sig reported) 30 tablet 1   prochlorperazine (COMPAZINE) 10 MG tablet Take 1 tablet (10 mg total) by mouth every 6 (six) hours as needed (Nausea or vomiting). (Patient not taking: No sig reported) 30 tablet 1   No current facility-administered medications on file prior to visit.   Allergies  Allergen Reactions   Corticosteroids Other (See Comments)    Pt trf from [name] and per primary md for her cancer tx. Notes that it causes agitation intolerance   Sulfa Antibiotics Other (See Comments)    Pt moved from [name] and in MD notes she has allergy but we do not know reactions when taking the drug   Celebrex [Celecoxib] Rash   Social History   Socioeconomic History   Marital status: Divorced     Spouse name: Not on file   Number of children: Not on file   Years of education: Not on file   Highest education level: Not on file  Occupational History   Occupation: retired Teacher, music    Comment: In [place]  Tobacco Use   Smoking status: Never   Smokeless tobacco: Never  Vaping Use   Vaping Use: Never used  Substance and Sexual Activity   Alcohol use: Not Currently   Drug use: Never   Sexual activity: Not Currently  Other Topics Concern   Not on file  Social History Narrative   Not on file   Social Determinants of Health   Financial Resource Strain: Not on file  Food Insecurity: Not on file  Transportation Needs: Not on file  Physical Activity: Not on file  Stress: Not on file  Social Connections: Not on file  Intimate Partner Violence: Not on file   Family History  Problem Relation Age of Onset   Pancreatic cancer Mother    Stroke Father    Diabetes Father    Hypertension Father    Heart disease Father    Skin cancer Father    Varicose Veins Father    Skin cancer Brother    Cancer - Prostate Brother      Vitals BP (!) 145/74   Pulse 90   Temp 98.2 F (  36.8 C) (Oral)   SpO2 90%    Examination  General - not in acute distress, comfortably sitting in chair HEENT - PEERLA, no pallor and no icterus Chest - b/l clear air entry, no additional sounds CVS- Normal s1s2, RRR Abdomen - Soft, Non tender , non distended Ext-      Neuro: grossly normal Back - WNL Psych : calm and cooperative   Recent labs CBC Latest Ref Rng & Units 08/26/2021 08/19/2021 08/12/2021  WBC 4.0 - 10.5 K/uL 4.9 5.3 4.4  Hemoglobin 12.0 - 15.0 g/dL 9.2(L) 9.2(L) 8.3(L)  Hematocrit 36.0 - 46.0 % 29.4(L) 29.6(L) 27.2(L)  Platelets 150 - 400 K/uL 289 323 264   CMP Latest Ref Rng & Units 08/26/2021 08/19/2021 08/12/2021  Glucose 70 - 99 mg/dL 112(H) 116(H) 107(H)  BUN 8 - 23 mg/dL $Remove'23 18 19  'OiyduNO$ Creatinine 0.44 - 1.00 mg/dL 1.06(H) 1.01(H) 1.12(H)  Sodium 135 - 145 mmol/L 137  138 137  Potassium 3.5 - 5.1 mmol/L 4.6 4.5 4.1  Chloride 98 - 111 mmol/L 104 108 104  CO2 22 - 32 mmol/L $RemoveB'26 24 25  'OdOSoiko$ Calcium 8.9 - 10.3 mg/dL 8.5(L) 8.7(L) 8.6(L)  Total Protein 6.5 - 8.1 g/dL 6.6 6.6 6.4(L)  Total Bilirubin 0.3 - 1.2 mg/dL 0.5 0.5 0.3  Alkaline Phos 38 - 126 U/L 66 66 61  AST 15 - 41 U/L $Remo'17 18 22  'Tfndi$ ALT 0 - 44 U/L $Remo'13 11 14     'PjzNv$ Pertinent Microbiology Results for orders placed or performed during the hospital encounter of 08/24/21  Aerobic Culture w Gram Stain (superficial specimen)     Status: Abnormal   Collection Time: 08/24/21 10:58 AM   Specimen: Wound  Result Value Ref Range Status   Specimen Description   Final    WOUND Performed at [facility], [address], [place], [place] [address]    Special Requests   Final    LEFT LEG Performed at [facility], [place] [place], [place] [address]    Gram Stain   Final    NO ORGANISMS SEEN SQUAMOUS EPITHELIAL CELLS PRESENT RARE WBC PRESENT, PREDOMINANTLY MONONUCLEAR MODERATE GRAM POSITIVE COCCI MODERATE GRAM NEGATIVE RODS    Culture (A)  Final    MULTIPLE ORGANISMS PRESENT, NONE PREDOMINANT NO STAPHYLOCOCCUS AUREUS ISOLATED NO GROUP A STREP (S.PYOGENES) ISOLATED Performed at [facility], [place] [address], [place], [place] [address]    Report Status 08/26/2021 FINAL  Final    Pertinent Imaging RT foot Xray 08/24/2021 IMPRESSION: Soft tissue swelling and small amount of gas along the margin of the great toe, likely the site of reported ulceration. No destructive bony changes, no acute bone findings.  TTE 08/01/21 Left ventricular ejection fraction, by estimation, is 60 to 65%. The left ventricle has normal function. The left ventricle has no regional wall motion abnormalities. There is mild left ventricular hypertrophy. Left ventricular diastolic parameters were normal. The average left ventricular global longitudinal strain is -17.7 %. The global longitudinal strain is  normal. 1. Right ventricular systolic function is normal. The right ventricular size is normal. Tricuspid regurgitation signal is inadequate for assessing PA pressure. 2. 3. Left atrial size was mildly dilated. The mitral valve is normal in structure. Mild mitral valve regurgitation. No evidence of mitral stenosis. 4. The aortic valve is normal in structure. Aortic valve regurgitation is not visualized. Mild to moderate aortic valve sclerosis/calcification is present, without any evidence of aortic stenosis. 5. The inferior vena cava is  normal in size with <50% respiratory variability, suggesting right atrial pressure of 8 mmHg.   Vascular US ABI 07/11/21 Summary: Right: Resting right ankle-brachial index is within normal range. No evidence of significant right lower extremity arterial disease. The right toe-brachial index is normal. Left: Resting left ankle-brachial index is within normal range. No evidence of significant left lower extremity LOWER EXTREMITY DOPPLER STUDY  All pertinent labs/Imagings/notes reviewed. All pertinent plain films and CT images have been personally visualized and interpreted; radiology reports have been reviewed. Decision making incorporated into the Impression / Recommendations.  I have spent a total of 60 minutes of face-to-face and non-face-to-face time, excluding clinical staff time, preparing to see patient, ordering tests and/or medications, and provide counseling the patient    Electronically signed by:  Rosiland Oz, MD Infectious Disease Physician [facility] [address] [place], [place], [place] [address] Phone: [phone]  Fax: [phone]

## 2021-08-29 NOTE — Telephone Encounter (Signed)
Have faxed over medical records request once again to [facility] at [phone] and [phone] both medical records and imaging department per TC (persons name)in the radiology dept. Received fax confirmation.

## 2021-08-30 ENCOUNTER — Ambulatory Visit
Admission: RE | Admit: 2021-08-30 | Discharge: 2021-08-30 | Disposition: A | Discharge status: Home / Self Care | Payer: Medicare Other | Source: Ambulatory Visit | Attending: Oncology | Admitting: Oncology

## 2021-08-30 DIAGNOSIS — Z5111 Encounter for antineoplastic chemotherapy: Secondary | ICD-10-CM | POA: Diagnosis present

## 2021-08-30 LAB — C-REACTIVE PROTEIN: CRP: 7.5 mg/L (ref <8.0)

## 2021-08-30 LAB — SEDIMENTATION RATE: Sed Rate: 29 mm/h (ref 0–30)

## 2021-08-31 ENCOUNTER — Encounter (HOSPITAL_BASED_OUTPATIENT_CLINIC_OR_DEPARTMENT_OTHER): Payer: Medicare Other | Admitting: Internal Medicine

## 2021-08-31 ENCOUNTER — Other Ambulatory Visit: Payer: Self-pay

## 2021-08-31 DIAGNOSIS — S91104D Unspecified open wound of right lesser toe(s) without damage to nail, subsequent encounter: Secondary | ICD-10-CM | POA: Diagnosis not present

## 2021-08-31 DIAGNOSIS — I872 Venous insufficiency (chronic) (peripheral): Secondary | ICD-10-CM | POA: Diagnosis not present

## 2021-08-31 DIAGNOSIS — S81802D Unspecified open wound, left lower leg, subsequent encounter: Secondary | ICD-10-CM | POA: Diagnosis not present

## 2021-08-31 DIAGNOSIS — S91101D Unspecified open wound of right great toe without damage to nail, subsequent encounter: Secondary | ICD-10-CM | POA: Diagnosis not present

## 2021-08-31 NOTE — Progress Notes (Signed)
Laura Santiago ([id number]) Visit Report for 08/31/2021 Arrival Information Details Patient Name: Laura Santiago, Laura Santiago. Date of Service: 08/31/2021 9:00 AM Medical Record Number: [id number] Patient Account Number: [id number] Date of Birth/Sex: June 14, 1946 (75 y.o. F) Treating RN: Dolan Amen Primary Care Charlye Spare: SYSTEM, PCP Other Clinician: Referring Toriano Aikey: Randa Evens Treating Stylianos Stradling/Extender: Yaakov Guthrie in Treatment: 8 Visit Information History Since Last Visit Pain Present Now: No Patient Arrived: Walker Arrival Time: 09:17 Accompanied By: self/service animal Transfer Assistance: None Patient Has Alerts: Yes Patient Alerts: PT HAS SERVICE ANIMAL Electronic Signature(s) Signed: 08/31/2021 4:25:23 PM By: Dolan Amen RN Entered By: Dolan Amen on 08/31/2021 09:20:25 Laura Santiago ([id number]) -------------------------------------------------------------------------------- Clinic Level of Care Assessment Details Patient Name: Laura Santiago. Date of Service: 08/31/2021 9:00 AM Medical Record Number: [id number] Patient Account Number: [id number] Date of Birth/Sex: Jul 15, 1946 (75 y.o. F) Treating RN: Dolan Amen Primary Care Shayra Anton: SYSTEM, PCP Other Clinician: Referring Ronnette Rump: Randa Evens Treating Aziel Morgan/Extender: Yaakov Guthrie in Treatment: 8 Clinic Level of Care Assessment Items TOOL 1 Quantity Score []  - Use when EandM and Procedure is performed on INITIAL visit 0 ASSESSMENTS - Nursing Assessment / Reassessment []  - General Physical Exam (combine w/ comprehensive assessment (listed just below) when performed on new 0 pt. evals) []  - 0 Comprehensive Assessment (HX, ROS, Risk Assessments, Wounds Hx, etc.) ASSESSMENTS - Wound and Skin Assessment / Reassessment []  - Dermatologic / Skin Assessment (not related to wound area) 0 ASSESSMENTS - Ostomy and/or Continence Assessment and Care []  - Incontinence Assessment and  Management 0 []  - 0 Ostomy Care Assessment and Management (repouching, etc.) PROCESS - Coordination of Care []  - Simple Patient / Family Education for ongoing care 0 []  - 0 Complex (extensive) Patient / Family Education for ongoing care []  - 0 Staff obtains Programmer, systems, Records, Test Results / Process Orders []  - 0 Staff telephones HHA, Nursing Homes / Clarify orders / etc []  - 0 Routine Transfer to another Facility (non-emergent condition) []  - 0 Routine Hospital Admission (non-emergent condition) []  - 0 New Admissions / Biomedical engineer / Ordering NPWT, Apligraf, etc. []  - 0 Emergency Hospital Admission (emergent condition) PROCESS - Special Needs []  - Pediatric / Minor Patient Management 0 []  - 0 Isolation Patient Management []  - 0 Hearing / Language / Visual special needs []  - 0 Assessment of Community assistance (transportation, D/C planning, etc.) []  - 0 Additional assistance / Altered mentation []  - 0 Support Surface(s) Assessment (bed, cushion, seat, etc.) INTERVENTIONS - Miscellaneous []  - External ear exam 0 []  - 0 Patient Transfer (multiple staff / Civil Service fast streamer / Similar devices) []  - 0 Simple Staple / Suture removal (25 or less) []  - 0 Complex Staple / Suture removal (26 or more) []  - 0 Hypo/Hyperglycemic Management (do not check if billed separately) []  - 0 Ankle / Brachial Index (ABI) - do not check if billed separately Has the patient been seen at the hospital within the last three years: Yes Total Score: 0 Level Of Care: ____ Laura Santiago ([id number]) Electronic Signature(s) Signed: 08/31/2021 4:25:23 PM By: Dolan Amen RN Entered By: Dolan Amen on 08/31/2021 10:00:17 Laura Santiago ([id number]) -------------------------------------------------------------------------------- Encounter Discharge Information Details Patient Name: Laura Santiago, Laura Santiago. Date of Service: 08/31/2021 9:00 AM Medical Record Number: [id number] Patient  Account Number: [id number] Date of Birth/Sex: 15-Apr-1946 (75 y.o. F) Treating RN: Dolan Amen Primary Care Elliona Doddridge: SYSTEM, PCP Other Clinician: Referring Yzabelle Calles: Randa Evens Treating Yoshua Geisinger/Extender: Yaakov Guthrie in Treatment: 8  Encounter Discharge Information Items Post Procedure Vitals Discharge Condition: Stable Temperature (F): 98.9 Ambulatory Status: Walker Pulse (bpm): 79 Discharge Destination: Home Respiratory Rate (breaths/min): 18 Transportation: Private Auto Blood Pressure (mmHg): 143/67 Accompanied By: self Schedule Follow-up Appointment: Yes Clinical Summary of Care: Electronic Signature(s) Signed: 08/31/2021 4:25:23 PM By: Rogers Blocker RN Entered By: Rogers Blocker on 08/31/2021 10:29:55 Laura Santiago ([id number]) -------------------------------------------------------------------------------- Lower Extremity Assessment Details Patient Name: Laura Santiago. Date of Service: 08/31/2021 9:00 AM Medical Record Number: [id number] Patient Account Number: [id number] Date of Birth/Sex: 02-09-1946 (75 y.o. F) Treating RN: Rogers Blocker Primary Care Regis Hinton: SYSTEM, PCP Other Clinician: Referring Sully Manzi: Owens Shark Treating Cyndy Braver/Extender: Tilda Franco in Treatment: 8 Edema Assessment Assessed: [Left: Yes] [Right: No] Edema: [Left: Ye] [Right: s] Calf Left: Right: Point of Measurement: 29 cm From Medial Instep 34 cm Ankle Left: Right: Point of Measurement: 10 cm From Medial Instep 23 cm Vascular Assessment Pulses: Dorsalis Pedis Palpable: [Left:Yes] Electronic Signature(s) Signed: 08/31/2021 4:25:23 PM By: Rogers Blocker RN Entered By: Rogers Blocker on 08/31/2021 09:35:11 Laura Santiago ([id number]) -------------------------------------------------------------------------------- Multi Wound Chart Details Patient Name: Laura Santiago. Date of Service: 08/31/2021 9:00 AM Medical Record Number:  [id number] Patient Account Number: [id number] Date of Birth/Sex: 03/14/1946 (75 y.o. 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 Comorbid History: Hypertension, Osteoarthritis, Hypertension, Osteoarthritis, Hypertension, Osteoarthritis, Received Chemotherapy, Received Received Chemotherapy, Received Received Chemotherapy, Received Radiation Radiation Radiation Date Acquired: 04/06/2021 08/24/2021 08/24/2021 Weeks of Treatment: 8 1 1  Wound Status: Open Open Healed - Epithelialized Clustered Wound: Yes No No Clustered Quantity: 2 N/A N/A Measurements L x W x D (cm) 9x10x0.2 0.3x0.4x0.3 0x0x0 Area (cm) : 70.686 0.094 0 Volume (cm) : 14.137 0.028 0 % Reduction in Area: 21.70% 40.10% 100.00% % Reduction in Volume: 21.70% 55.60% 100.00% Classification: Full Thickness Without Exposed Full Thickness Without Exposed Full Thickness Without Exposed Support Structures Support Structures Support Structures Exudate Amount: Large Medium None Present Exudate Type: Serosanguineous Serosanguineous N/A Exudate Color: red, brown red, brown N/A Granulation Amount: Large (67-100%) Large (67-100%) None Present (0%) Granulation Quality: Red Red N/A Necrotic Amount: Small (1-33%) Small (1-33%) None Present (0%) Exposed Structures: Fat Layer (Subcutaneous Tissue): Fat Layer (Subcutaneous Tissue): Fascia: No Yes Yes Fat Layer (Subcutaneous Tissue): Fascia:  No Fascia: No No Tendon: No Tendon: No Tendon: No Muscle: No Muscle: No Muscle: No Joint: No Joint: No Joint: No Bone: No Bone: No Bone: No Epithelialization: Large (67-100%) None Large (67-100%) Debridement: Debridement - Excisional N/A N/A Pre-procedure Verification/Time 09:53 N/A N/A Out Taken: Tissue Debrided: Subcutaneous, Slough N/A N/A Level: Skin/Subcutaneous Tissue N/A N/A Debridement Area (sq cm): 90 N/A N/A Instrument: Curette N/A N/A Bleeding: Minimum N/A N/A Hemostasis Achieved: Pressure N/A N/A Debridement Treatment Procedure was tolerated well N/A N/A ResponseSTARLINA, LAPRE (Laura Santiago) Post Debridement 9x10x0.3 N/A N/A Measurements L x W x D (cm) Post Debridement Volume: 21.206 N/A N/A (cm) Procedures Performed: Debridement N/A N/A Wound Number: 4 N/A N/A Photos: N/A N/A Wound Location: Right Toe Third N/A N/A Wounding Event: Footwear Injury N/A N/A Primary Etiology: Neuropathic Ulcer-Non  Diabetic N/A N/A Comorbid History: Hypertension, Osteoarthritis, N/A N/A Received Chemotherapy, Received Radiation Date Acquired: 08/24/2021 N/A N/A Weeks of Treatment: 1 N/A N/A Wound Status: Open N/A N/A Clustered Wound: No N/A N/A Clustered Quantity: N/A N/A N/A Measurements L x W x D (cm) 0.1x0.1x0.1 N/A N/A Area (cm) : 0.008 N/A N/A Volume (cm) : 0.001 N/A N/A % Reduction in Area: 74.20% N/A N/A % Reduction in Volume: 83.30% N/A N/A Classification: Full Thickness Without Exposed N/A N/A Support Structures Exudate Amount: None Present N/A N/A Exudate Type: N/A N/A N/A Exudate Color: N/A N/A N/A Granulation Amount: None Present (0%) N/A N/A Granulation Quality: N/A N/A N/A Necrotic Amount: None Present (0%) N/A N/A Exposed Structures: Fat Layer (Subcutaneous Tissue): N/A N/A Yes Fascia: No Tendon: No Muscle: No Joint: No Bone: No Epithelialization: None N/A N/A Debridement: N/A N/A N/A Tissue Debrided: N/A N/A N/A Level: N/A N/A  N/A Debridement Area (sq cm): N/A N/A N/A Instrument: N/A N/A N/A Bleeding: N/A N/A N/A Hemostasis Achieved: N/A N/A N/A Debridement Treatment N/A N/A N/A Response: Post Debridement N/A N/A N/A Measurements L x W x D (cm) Post Debridement Volume: N/A N/A N/A (cm) Procedures Performed: N/A N/A N/A Treatment Notes Wound #1 (Lower Leg) Wound Laterality: Left, Medial Cleanser Soap and Water Discharge Instruction: Gently cleanse wound with antibacterial soap, rinse and pat dry prior to dressing wounds SPRUHA, WEIGHT. ([id number]) Peri-Wound Care Desitin Maximum Strength Ointment 4 (oz) Discharge Instruction: Apply on macerated area Topical Gentamicin Discharge Instruction: Apply as directed by Candiss Galeana. Primary Dressing Silvercel 4 1/4x 4 1/4 (in/in) Discharge Instruction: Apply Silvercel 4 1/4x 4 1/4 (in/in) as instructed Secondary Dressing ABD Pad 5x9 (in/in) Discharge Instruction: Cover with ABD pad Secured With Compression Wrap Profore Lite LF 3 Multilayer Compression [place] Discharge Instruction: Apply 3 multi-layer wrap as prescribed. Compression Stockings Add-Ons Wound #2 (Toe Great) Wound Laterality: Right Cleanser Wound Cleanser Discharge Instruction: Wash your hands with soap and water. Remove old dressing, discard into plastic bag and place into trash. Cleanse the wound with Wound Cleanser prior to applying a clean dressing using gauze sponges, not tissues or cotton balls. Do not scrub or use excessive force. Pat dry using gauze sponges, not tissue or cotton balls. Peri-Wound Care Topical Primary Dressing Silvercel Small 2x2 (in/in) Discharge Instruction: Apply Silvercel Small 2x2 (in/in) as instructed Secondary Dressing Gauze Discharge Instruction: Cover with dry gauze Secured With 68M Medipore H Soft Cloth Surgical Tape, 2x2 (in/yd) Discharge Instruction: Secure dressing Compression Wrap Compression Stockings Add-Ons Wound #4 (Toe Third)  Wound Laterality: Right Cleanser Wound Cleanser Discharge Instruction: Wash your hands with soap and water. Remove old dressing, discard into plastic bag and place into trash. Cleanse the wound with Wound Cleanser prior to applying a clean dressing using gauze sponges, not tissues or cotton balls. Do not scrub or use excessive force. Pat dry using gauze sponges, not tissue or cotton balls. Peri-Wound Care MADASYN, HEATH ([id number]) Topical Triple Antibiotic Ointment, 1 (oz) Tube Discharge Instruction: Apply on wound Primary Dressing Secondary Dressing Coverlet Latex-Free Fabric Adhesive Dressings Discharge Instruction: 1.5 x 2 Secured With Compression Wrap Compression Stockings Add-Ons Electronic Signature(s) Signed: 08/31/2021 12:48:43 PM By: Kalman Shan DO Entered By: Kalman Shan on 08/31/2021 12:41:12 Laura Santiago ([id number]) -------------------------------------------------------------------------------- Multi-Disciplinary Care Plan Details Patient Name: Laura Santiago, Laura Santiago. Date of Service: 08/31/2021 9:00 AM Medical Record Number: [id number] Patient Account Number: [id number] Date of Birth/Sex: 1946/05/01 (75 y.o. F) Treating RN: Dolan Amen Primary Care Zakiah Beckerman: SYSTEM, PCP Other  Clinician: Referring Jasdeep Kepner: Randa Evens Treating Isadore Bokhari/Extender: Yaakov Guthrie in Treatment: 8 Active Inactive Necrotic Tissue Nursing Diagnoses: Impaired tissue integrity related to necrotic/devitalized tissue Knowledge deficit related to management of necrotic/devitalized tissue Goals: Necrotic/devitalized tissue will be minimized in the wound bed Date Initiated: 07/06/2021 Date Inactivated: 07/27/2021 Target Resolution Date: 07/06/2021 Goal Status: Met Patient/caregiver will verbalize understanding of reason and process for debridement of necrotic tissue Date Initiated: 07/06/2021 Target Resolution Date: 07/06/2021 Goal Status:  Active Interventions: Assess patient pain level pre-, during and post procedure and prior to discharge Provide education on necrotic tissue and debridement process Treatment Activities: Apply topical anesthetic as ordered : 07/06/2021 Biologic debridement : 07/06/2021 Enzymatic debridement : 07/06/2021 Excisional debridement : 07/06/2021 Notes: Wound/Skin Impairment Nursing Diagnoses: Impaired tissue integrity Goals: Patient/caregiver will verbalize understanding of skin care regimen Date Initiated: 07/06/2021 Date Inactivated: 07/27/2021 Target Resolution Date: 07/06/2021 Goal Status: Met Ulcer/skin breakdown will have a volume reduction of 30% by week 4 Date Initiated: 07/06/2021 Target Resolution Date: 08/06/2021 Goal Status: Active Ulcer/skin breakdown will have a volume reduction of 50% by week 8 Date Initiated: 07/06/2021 Target Resolution Date: 09/06/2021 Goal Status: Active Ulcer/skin breakdown will have a volume reduction of 80% by week 12 Date Initiated: 07/06/2021 Target Resolution Date: 10/06/2021 Goal Status: Active Ulcer/skin breakdown will heal within 14 weeks Date Initiated: 07/06/2021 Target Resolution Date: 11/06/2021 Goal Status: Active Interventions: Assess patient/caregiver ability to obtain necessary supplies Assess patient/caregiver ability to perform ulcer/skin care regimen upon admission and as needed Assess ulceration(s) every visit THURMA, PRIEGO ([id number]) Treatment Activities: Referred to DME Houda Brau for dressing supplies : 07/06/2021 Skin care regimen initiated : 07/06/2021 Notes: Electronic Signature(s) Signed: 08/31/2021 4:25:23 PM By: Dolan Amen RN Entered By: Dolan Amen on 08/31/2021 09:53:08 Laura Santiago ([id number]) -------------------------------------------------------------------------------- Pain Assessment Details Patient Name: Laura Santiago. Date of Service: 08/31/2021 9:00 AM Medical Record Number:  [id number] Patient Account Number: [id number] Date of Birth/Sex: 03-30-1946 (75 y.o. F) Treating RN: Dolan Amen Primary Care Rosann Gorum: SYSTEM, PCP Other Clinician: Referring Alyson Ki: Randa Evens Treating Charliene Inoue/Extender: Yaakov Guthrie in Treatment: 8 Active Problems Location of Pain Severity and Description of Pain Patient Has Paino No Site Locations Rate the pain. Current Pain Level: 0 Pain Management and Medication Current Pain Management: Electronic Signature(s) Signed: 08/31/2021 4:25:23 PM By: Dolan Amen RN Entered By: Dolan Amen on 08/31/2021 09:20:59 Laura Santiago ([id number]) -------------------------------------------------------------------------------- Patient/Caregiver Education Details Patient Name: Laura Santiago. Date of Service: 08/31/2021 9:00 AM Medical Record Number: [id number] Patient Account Number: [id number] Date of Birth/Gender: Mar 22, 1946 (75 y.o. F) Treating RN: Dolan Amen Primary Care Physician: SYSTEM, PCP Other Clinician: Referring Physician: Randa Evens Treating Physician/Extender: Yaakov Guthrie in Treatment: 8 Education Assessment Education Provided To: Patient Education Topics Provided Wound Debridement: Methods: Explain/Verbal Responses: State content correctly Wound/Skin Impairment: Methods: Explain/Verbal Responses: State content correctly Electronic Signature(s) Signed: 08/31/2021 4:25:23 PM By: Dolan Amen RN Entered By: Dolan Amen on 08/31/2021 10:17:43 Laura Santiago ([id number]) -------------------------------------------------------------------------------- Wound Assessment Details Patient Name: Laura Santiago. Date of Service: 08/31/2021 9:00 AM Medical Record Number: [id number] Patient Account Number: [id number] Date of Birth/Sex: [birth year] (75 y.o. F) Treating RN: Dolan Amen Primary Care Jasmyn Picha: SYSTEM, PCP Other Clinician: Referring Drishti Pepperman: Randa Evens Treating Gavon Majano/Extender: Yaakov Guthrie in Treatment: 8 Wound Status Wound Number: 1 Primary Venous Leg Ulcer Etiology: Wound Location: Left, Medial Lower Leg Wound Status: Open Wounding Event: Gradually Appeared Comorbid Hypertension, Osteoarthritis, Received Chemotherapy, Date Acquired: 04/06/2021  History: Received Radiation Weeks Of Treatment: 8 Clustered Wound: Yes Photos Wound Measurements Length: (cm) 9 Width: (cm) 10 Depth: (cm) 0.2 Clustered Quantity: 2 Area: (cm) 70.686 Volume: (cm) 14.137 % Reduction in Area: 21.7% % Reduction in Volume: 21.7% Epithelialization: Large (67-100%) Tunneling: No Undermining: No Wound Description Classification: Full Thickness Without Exposed Support Structu Exudate Amount: Large Exudate Type: Serosanguineous Exudate Color: red, brown res Foul Odor After Cleansing: No Slough/Fibrino Yes Wound Bed Granulation Amount: Large (67-100%) Exposed Structure Granulation Quality: Red Fascia Exposed: No Necrotic Amount: Small (1-33%) Fat Layer (Subcutaneous Tissue) Exposed: Yes Necrotic Quality: Adherent Slough Tendon Exposed: No Muscle Exposed: No Joint Exposed: No Bone Exposed: No Treatment Notes Wound #1 (Lower Leg) Wound Laterality: Left, Medial Cleanser Soap and Water Discharge Instruction: Gently cleanse wound with antibacterial soap, rinse and pat dry prior to dressing wounds Peri-Wound Care BRYSON, PALEN. ([id number]) Desitin Maximum Strength Ointment 4 (oz) Discharge Instruction: Apply on macerated area Topical Gentamicin Discharge Instruction: Apply as directed by Lyndi Holbein. Primary Dressing Silvercel 4 1/4x 4 1/4 (in/in) Discharge Instruction: Apply Silvercel 4 1/4x 4 1/4 (in/in) as instructed Secondary Dressing ABD Pad 5x9 (in/in) Discharge Instruction: Cover with ABD pad Secured With Compression Wrap Profore Lite LF 3 Multilayer Compression [place] Discharge Instruction: Apply 3  multi-layer wrap as prescribed. Compression Stockings Add-Ons Electronic Signature(s) Signed: 08/31/2021 4:25:23 PM By: Dolan Amen RN Entered By: Dolan Amen on 08/31/2021 09:31:54 Laura Santiago, Laura Santiago ([id number]) -------------------------------------------------------------------------------- Wound Assessment Details Patient Name: Laura Santiago, Laura Santiago. Date of Service: 08/31/2021 9:00 AM Medical Record Number: [id number] Patient Account Number: [id number] Date of Birth/Sex: 05-07-1946 (76 y.o. F) Treating RN: Dolan Amen Primary Care Vin Yonke: SYSTEM, PCP Other Clinician: Referring Karalee Hauter: Randa Evens Treating Rupa Lagan/Extender: Yaakov Guthrie in Treatment: 8 Wound Status Wound Number: 2 Primary Neuropathic Ulcer-Non Diabetic Etiology: Wound Location: Right Toe Great Wound Status: Open Wounding Event: Gradually Appeared Comorbid Hypertension, Osteoarthritis, Received Chemotherapy, Date Acquired: 08/24/2021 History: Received Radiation Weeks Of Treatment: 1 Clustered Wound: No Photos Wound Measurements Length: (cm) 0.3 Width: (cm) 0.4 Depth: (cm) 0.3 Area: (cm) 0.094 Volume: (cm) 0.028 % Reduction in Area: 40.1% % Reduction in Volume: 55.6% Epithelialization: None Tunneling: No Undermining: No Wound Description Classification: Full Thickness Without Exposed Support Structures Exudate Amount: Medium Exudate Type: Serosanguineous Exudate Color: red, brown Foul Odor After Cleansing: No Slough/Fibrino Yes Wound Bed Granulation Amount: Large (67-100%) Exposed Structure Granulation Quality: Red Fascia Exposed: No Necrotic Amount: Small (1-33%) Fat Layer (Subcutaneous Tissue) Exposed: Yes Necrotic Quality: Adherent Slough Tendon Exposed: No Muscle Exposed: No Joint Exposed: No Bone Exposed: No Treatment Notes Wound #2 (Toe Great) Wound Laterality: Right Cleanser Wound Cleanser Discharge Instruction: Wash your hands with soap and water. Remove old  dressing, discard into plastic bag and place into trash. Cleanse the wound with Wound Cleanser prior to applying a clean dressing using gauze sponges, not tissues or cotton balls. Do not scrub or use excessive force. Pat dry using gauze sponges, not tissue or cotton balls. JAILINE, LIEDER ([id number]) Peri-Wound Care Topical Primary Dressing Silvercel Small 2x2 (in/in) Discharge Instruction: Apply Silvercel Small 2x2 (in/in) as instructed Secondary Dressing Gauze Discharge Instruction: Cover with dry gauze Secured With 40M [employer] Surgical Tape, 2x2 (in/yd) Discharge Instruction: Secure dressing Compression Wrap Compression Stockings Add-Ons Electronic Signature(s) Signed: 08/31/2021 4:25:23 PM By: Dolan Amen RN Entered By: Dolan Amen on 08/31/2021 09:32:21 Laura Santiago ([id number]) -------------------------------------------------------------------------------- Wound Assessment Details Patient Name: Laura Santiago. Date of Service: 08/31/2021  9:00 AM Medical Record Number: [id number] Patient Account Number: [id number] Date of Birth/Sex: Nov 08, 1946 (75 y.o. F) Treating RN: Dolan Amen Primary Care Koreena Joost: SYSTEM, PCP Other Clinician: Referring Trichelle Lehan: Randa Evens Treating Arhaan Chesnut/Extender: Yaakov Guthrie in Treatment: 8 Wound Status Wound Number: 3 Primary Neuropathic Ulcer-Non Diabetic Etiology: Wound Location: Right Toe Second Wound Status: Healed - Epithelialized Wounding Event: Footwear Injury Comorbid Hypertension, Osteoarthritis, Received Chemotherapy, Date Acquired: 08/24/2021 History: Received Radiation Weeks Of Treatment: 1 Clustered Wound: No Photos Wound Measurements Length: (cm) 0 Width: (cm) 0 Depth: (cm) 0 Area: (cm) 0 Volume: (cm) 0 % Reduction in Area: 100% % Reduction in Volume: 100% Epithelialization: Large (67-100%) Tunneling: No Undermining: No Wound Description Classification: Full Thickness  Without Exposed Support Structures Exudate Amount: None Present Foul Odor After Cleansing: No Slough/Fibrino No Wound Bed Granulation Amount: None Present (0%) Exposed Structure Necrotic Amount: None Present (0%) Fascia Exposed: No Fat Layer (Subcutaneous Tissue) Exposed: No Tendon Exposed: No Muscle Exposed: No Joint Exposed: No Bone Exposed: No Electronic Signature(s) Signed: 08/31/2021 4:25:23 PM By: Dolan Amen RN Entered By: Dolan Amen on 08/31/2021 09:57:22 Laura Santiago ([id number]) -------------------------------------------------------------------------------- Wound Assessment Details Patient Name: Laura Santiago. Date of Service: 08/31/2021 9:00 AM Medical Record Number: [id number] Patient Account Number: [id number] Date of Birth/Sex: [birth year] (75 y.o. F) Treating RN: Dolan Amen Primary Care Jeffren Dombek: SYSTEM, PCP Other Clinician: Referring TRUE Shackleford: Randa Evens Treating Alyson Ki/Extender: Yaakov Guthrie in Treatment: 8 Wound Status Wound Number: 4 Primary Neuropathic Ulcer-Non Diabetic Etiology: Wound Location: Right Toe Third Wound Status: Open Wounding Event: Footwear Injury Comorbid Hypertension, Osteoarthritis, Received Chemotherapy, Date Acquired: 08/24/2021 History: Received Radiation Weeks Of Treatment: 1 Clustered Wound: No Photos Wound Measurements Length: (cm) 0.1 Width: (cm) 0.1 Depth: (cm) 0.1 Area: (cm) 0.008 Volume: (cm) 0.001 % Reduction in Area: 74.2% % Reduction in Volume: 83.3% Epithelialization: None Tunneling: No Undermining: No Wound Description Classification: Full Thickness Without Exposed Support Structures Exudate Amount: None Present Foul Odor After Cleansing: No Slough/Fibrino No Wound Bed Granulation Amount: None Present (0%) Exposed Structure Necrotic Amount: None Present (0%) Fascia Exposed: No Fat Layer (Subcutaneous Tissue) Exposed: Yes Tendon Exposed: No Muscle Exposed: No Joint  Exposed: No Bone Exposed: No Treatment Notes Wound #4 (Toe Third) Wound Laterality: Right Cleanser Wound Cleanser Discharge Instruction: Wash your hands with soap and water. Remove old dressing, discard into plastic bag and place into trash. Cleanse the wound with Wound Cleanser prior to applying a clean dressing using gauze sponges, not tissues or cotton balls. Do not scrub or use excessive force. Pat dry using gauze sponges, not tissue or cotton balls. Peri-Wound Care [place], STARE ([id number]) Topical Triple Antibiotic Ointment, 1 (oz) Tube Discharge Instruction: Apply on wound Primary Dressing Secondary Dressing Coverlet Latex-Free Fabric Adhesive Dressings Discharge Instruction: 1.5 x 2 Secured With Compression Wrap Compression Stockings Add-Ons Electronic Signature(s) Signed: 08/31/2021 4:25:23 PM By: Dolan Amen RN Entered By: Dolan Amen on 08/31/2021 09:33:17 Laura Santiago ([id number]) -------------------------------------------------------------------------------- Vitals Details Patient Name: Laura Santiago. Date of Service: 08/31/2021 9:00 AM Medical Record Number: [id number] Patient Account Number: [id number] Date of Birth/Sex: 20-Mar-1946 (75 y.o. F) Treating RN: Dolan Amen Primary Care Jaydyn Bozzo: SYSTEM, PCP Other Clinician: Referring Eden Rho: Randa Evens Treating Essence Merle/Extender: Yaakov Guthrie in Treatment: 8 Vital Signs Time Taken: 09:20 Temperature (F): 98.9 Height (in): 66 Pulse (bpm): 79 Weight (lbs): 153 Respiratory Rate (breaths/min): 18 Body Mass Index (BMI): 24.7 Blood Pressure (mmHg): 143/67 Reference Range: 80 - 120 mg / dl  Electronic Signature(s) Signed: 08/31/2021 4:25:23 PM By: Dolan Amen RN Entered By: Dolan Amen on 08/31/2021 09:20:41

## 2021-08-31 NOTE — Progress Notes (Signed)
Laura Santiago, Laura Santiago ([id number]) Visit Report for 08/31/2021 Chief Complaint Document Details Patient Name: ANGLE, DIRUSSO. Date of Service: 08/31/2021 9:00 AM Medical Record Number: [id number] Patient Account Number: [id number] Date of Birth/Sex: 05-Mar-1946 (75 y.o. F) Treating RN: Dolan Amen Primary Care Provider: SYSTEM, PCP Other Clinician: Referring Provider: Randa Evens Treating Provider/Extender: Yaakov Guthrie in Treatment: 8 Information Obtained from: Patient Chief Complaint Left lower extremity wound Right toe wounds Electronic Signature(s) Signed: 08/31/2021 12:48:43 PM By: Kalman Shan DO Entered By: Kalman Shan on 08/31/2021 12:41:21 Laura Santiago ([id number]) -------------------------------------------------------------------------------- Debridement Details Patient Name: Laura Santiago. Date of Service: 08/31/2021 9:00 AM Medical Record Number: [id number] Patient Account Number: [id number] Date of Birth/Sex: 07-24-1946 (75 y.o. F) Treating RN: Dolan Amen Primary Care Provider: SYSTEM, PCP Other Clinician: Referring Provider: Randa Evens Treating Provider/Extender: Yaakov Guthrie in Treatment: 8 Debridement Performed for Wound #1 Left,Medial Lower Leg Assessment: Performed By: Physician Kalman Shan, MD Debridement Type: Debridement Severity of Tissue Pre Debridement: Limited to breakdown of skin Level of Consciousness (Pre- Awake and Alert procedure): Pre-procedure Verification/Time Out Yes - 09:53 Taken: Start Time: 09:53 Total Area Debrided (L x W): 9 (cm) x 10 (cm) = 90 (cm) Tissue and other material Viable, Non-Viable, Slough, Subcutaneous, Slough debrided: Level: Skin/Subcutaneous Tissue Debridement Description: Excisional Instrument: Curette Bleeding: Minimum Hemostasis Achieved: Pressure Response to Treatment: Procedure was tolerated well Level of Consciousness (Post- Awake and  Alert procedure): Post Debridement Measurements of Total Wound Length: (cm) 9 Width: (cm) 10 Depth: (cm) 0.3 Volume: (cm) 21.206 Character of Wound/Ulcer Post Debridement: Stable Severity of Tissue Post Debridement: Fat layer exposed Post Procedure Diagnosis Same as Pre-procedure Electronic Signature(s) Signed: 08/31/2021 12:48:43 PM By: Kalman Shan DO Signed: 08/31/2021 4:25:23 PM By: Dolan Amen RN Entered By: Dolan Amen on 08/31/2021 09:55:39 Laura Santiago ([id number]) -------------------------------------------------------------------------------- HPI Details Patient Name: Laura Santiago. Date of Service: 08/31/2021 9:00 AM Medical Record Number: [id number] Patient Account Number: [id number] Date of Birth/Sex: Nov 19, 1946 (75 y.o. F) Treating RN: Dolan Amen Primary Care Provider: SYSTEM, PCP Other Clinician: Referring Provider: Randa Evens Treating Provider/Extender: Yaakov Guthrie in Treatment: 8 History of Present Illness HPI Description: Admission 7/27 Ms. Roya Gieselman is a 75 year old female with a past medical history of ADHD, metastatic breast cancer, stage IV chronic kidney disease, history of DVT on Xarelto and chronic venous insufficiency that presents to the clinic for a chronic left lower extremity wound. She recently moved to [facility] 4 days ago. She was being followed by wound care center in [name]. She reports a 10-year history of wounds to her left lower extremity that eventually do heal with debridement and compression therapy. She states that the current wound reopened 4 months ago and she is using Vaseline and Coban. She denies signs of infection. 8/3; patient presents for 1 week follow-up. She reports no issues or complaints today. She states she had vascular studies done in the last week. She denies signs of infection. She brought her little service dog with her today. 8/17; patient presents for follow-up. She  has missed her last clinic appointment. She states she took the wrap off and attempted to rewrap her leg. She is having difficulty with transportation. She has her service dog with her today. Overall she feels well and reports improvement in wound healing. She denies signs of infection. She reports owning an old Velcro wrap compression and has this at her living facility 9/14; patient presents for follow-up. Patient states that over the  past 2 to 3 weeks she developed toe wounds to her right foot. She attributes this to tight fitting shoes. She subsequently developed cellulitis in the right leg and has been treated by doxycycline by her oncologist. She reports improvement in symptoms however continues to have some redness and swelling to this leg. To the left lower extremity patient has been having her wraps changed with home health twice weekly. She states that the [facility] is not helping control the drainage. Other than that she has no issues or complaints today. She denies signs of infection to the left lower extremity. 9/21; patient presents for follow-up. She reports seeing infectious disease for her cellulitis. She reports no further management. She has home health that changes the wraps twice weekly. She has no issues or complaints today. She denies signs of infection. Electronic Signature(s) Signed: 08/31/2021 12:48:43 PM By: Kalman Shan DO Entered By: Kalman Shan on 08/31/2021 12:42:06 Laura Santiago ([id number]) -------------------------------------------------------------------------------- Physical Exam Details Patient Name: Laura Santiago, Laura Santiago. Date of Service: 08/31/2021 9:00 AM Medical Record Number: [id number] Patient Account Number: [id number] Date of Birth/Sex: 18-Dec-1945 (75 y.o. F) Treating RN: Dolan Amen Primary Care Provider: SYSTEM, PCP Other Clinician: Referring Provider: Randa Evens Treating Provider/Extender: Yaakov Guthrie in Treatment:  8 Constitutional . Cardiovascular . Psychiatric . Notes Left lower extremity: Open wounds to the medial aspect with granulation tissue, slough and fibrinous tissue. Less maceration noted. Right lower extremity: No increased warmth or erythema to the leg. To the second and third toe there are scabs from where there were previous open wounds. To the right great toe there is an open wound with granulation tissue and circumferential undermining. No signs of infection seen on exam. Electronic Signature(s) Signed: 08/31/2021 12:48:43 PM By: Kalman Shan DO Entered By: Kalman Shan on 08/31/2021 12:43:55 Laura Santiago ([id number]) -------------------------------------------------------------------------------- Physician Orders Details Patient Name: Laura Santiago. Date of Service: 08/31/2021 9:00 AM Medical Record Number: [id number] Patient Account Number: [id number] Date of Birth/Sex: Jul 31, 1946 (75 y.o. F) Treating RN: Dolan Amen Primary Care Provider: SYSTEM, PCP Other Clinician: Referring Provider: Randa Evens Treating Provider/Extender: Yaakov Guthrie in Treatment: 8 Verbal / Phone Orders: No Diagnosis Coding ICD-10 Coding Code Description S81.802D Unspecified open wound, left lower leg, subsequent encounter S91.101D Unspecified open wound of right great toe without damage to nail, subsequent encounter S91.104D Unspecified open wound of right lesser toe(s) without damage to nail, subsequent encounter I87.2 Venous insufficiency (chronic) (peripheral) 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 I10 Essential (primary) hypertension Z79.01 Long term (current) use of anticoagulants Follow-up Appointments o Return Appointment in 2 weeks. [place] for wound care. May utilize formulary equivalent dressing for wound treatment orders unless otherwise specified. Home Health Nurse may visit PRN  to address patientos wound care needs. o Scheduled days for dressing changes to be completed; exception, patient has scheduled wound care visit that day. o **Please direct any NON-WOUND related issues/requests for orders to patient's Primary Care Physician. **If current dressing causes regression in wound condition, may D/C ordered dressing product/s and apply Normal Saline Moist Dressing daily until next [place] or Other MD appointment. **Notify W[facility]aling Center of regression in wound condition at [phone]. Bathing/ Shower/ Hygiene o May shower with wound dressing protected with water repellent cover or cast protector. o No tub bath. Medications-Please add to medication list. o P.O. Antibiotics - continue keflex Wound Treatment Wound #1 - Lower Leg Wound Laterality: Left, Medial  Cleanser: Soap and Water 2 x Per Week/30 Days Discharge Instructions: Gently cleanse wound with antibacterial soap, rinse and pat dry prior to dressing wounds Peri-Wound Care: Desitin Maximum Strength Ointment 4 (oz) 2 x Per Week/30 Days Discharge Instructions: Apply on macerated area Topical: Gentamicin 2 x Per Week/30 Days Discharge Instructions: Apply as directed by provider. Primary Dressing: Silvercel 4 1/4x 4 1/4 (in/in) 2 x Per Week/30 Days Discharge Instructions: Apply Silvercel 4 1/4x 4 1/4 (in/in) as instructed Secondary Dressing: ABD Pad 5x9 (in/in) (Generic) 2 x Per Week/30 Days Discharge Instructions: Cover with ABD pad Compression Wrap: Profore Lite LF 3 Multilayer Compression Bandaging System 2 x Per Week/30 Days Discharge Instructions: Apply 3 multi-layer wrap as prescribed. Wound #2 - Toe Great Wound Laterality: Right Cleanser: Wound Cleanser 1 x Per Day/30 Days AARIYANA, MANZ ([id number]) Discharge Instructions: Wash your hands with soap and water. Remove old dressing, discard into plastic bag and place into trash. Cleanse the wound with Wound Cleanser prior to  applying a clean dressing using gauze sponges, not tissues or cotton balls. Do not scrub or use excessive force. Pat dry using gauze sponges, not tissue or cotton balls. Primary Dressing: Silvercel Small 2x2 (in/in) 1 x Per Day/30 Days Discharge Instructions: Apply Silvercel Small 2x2 (in/in) as instructed Secondary Dressing: Gauze 1 x Per Day/30 Days Discharge Instructions: Cover with dry gauze Secured With: 92M Medipore H Soft Cloth Surgical Tape, 2x2 (in/yd) 1 x Per Day/30 Days Discharge Instructions: Secure dressing Wound #4 - Toe Third Wound Laterality: Right Cleanser: Wound Cleanser 3 x Per Week/30 Days Discharge Instructions: Wash your hands with soap and water. Remove old dressing, discard into plastic bag and place into trash. Cleanse the wound with Wound Cleanser prior to applying a clean dressing using gauze sponges, not tissues or cotton balls. Do not scrub or use excessive force. Pat dry using gauze sponges, not tissue or cotton balls. Topical: Triple Antibiotic Ointment, 1 (oz) Tube 3 x Per Week/30 Days Discharge Instructions: Apply on wound Secondary Dressing: Coverlet Latex-Free Fabric Adhesive Dressings 3 x Per Week/30 Days Discharge Instructions: 1.5 x 2 Electronic Signature(s) Signed: 08/31/2021 12:48:43 PM By: Kalman Shan DO Signed: 08/31/2021 4:25:23 PM By: Dolan Amen RN Entered By: Dolan Amen on 08/31/2021 10:00:02 Laura Santiago ([id number]) -------------------------------------------------------------------------------- Problem List Details Patient Name: Laura Santiago, Laura Santiago. Date of Service: 08/31/2021 9:00 AM Medical Record Number: [id number] Patient Account Number: [id number] Date of Birth/Sex: [birth year] (75 y.o. F) Treating RN: Dolan Amen Primary Care Provider: SYSTEM, PCP Other Clinician: Referring Provider: Randa Evens Treating Provider/Extender: Yaakov Guthrie in Treatment: 8 Active Problems ICD-10 Encounter Code Description  Active Date MDM Diagnosis S81.802D Unspecified open wound, left lower leg, subsequent encounter 07/13/2021 No Yes S91.101D Unspecified open wound of right great toe without damage to nail, 08/31/2021 No Yes subsequent encounter S91.104D Unspecified open wound of right lesser toe(s) without damage to nail, 08/31/2021 No Yes subsequent encounter I87.2 Venous insufficiency (chronic) (peripheral) 07/06/2021 No Yes 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 07/06/2021 No Yes I10 Essential (primary) hypertension 07/06/2021 No Yes Z79.01 Long term (current) use of anticoagulants 07/06/2021 No Yes Inactive Problems ICD-10 Code Description Active Date Inactive Date S81.802A Unspecified open wound, left lower leg, initial encounter 07/06/2021 07/06/2021 S91.101A Unspecified open wound of right great toe without damage to nail, initial 08/24/2021 08/24/2021 encounter S91.104A Unspecified open wound of right lesser toe(s) without damage to nail, initial 08/24/2021 08/24/2021 encounter Resolved Problems Electronic Signature(s) MIKALA, PODOLL ([id number]) Signed: 08/31/2021 12:48:43  PM By: Kalman Shan DO Entered By: Kalman Shan on 08/31/2021 12:41:06 Laura Santiago ([id number]) -------------------------------------------------------------------------------- Progress Note Details Patient Name: Laura Santiago, Laura Santiago. Date of Service: 08/31/2021 9:00 AM Medical Record Number: [id number] Patient Account Number: [id number] Date of Birth/Sex: 09-19-1946 (75 y.o. F) Treating RN: Dolan Amen Primary Care Provider: SYSTEM, PCP Other Clinician: Referring Provider: Randa Evens Treating Provider/Extender: Yaakov Guthrie in Treatment: 8 Subjective Chief Complaint Information obtained from Patient Left lower extremity wound Right toe wounds History of Present Illness (HPI) Admission 7/27 Ms. Cassadie Pankonin is a 75 year old female with a past medical history of ADHD, metastatic breast cancer,  stage IV chronic kidney disease, history of DVT on Xarelto and chronic venous insufficiency that presents to the clinic for a chronic left lower extremity wound. She recently moved to [facility] 4 days ago. She was being followed by wound care center in [name]. She reports a 10-year history of wounds to her left lower extremity that eventually do heal with debridement and compression therapy. She states that the current wound reopened 4 months ago and she is using Vaseline and Coban. She denies signs of infection. 8/3; patient presents for 1 week follow-up. She reports no issues or complaints today. She states she had vascular studies done in the last week. She denies signs of infection. She brought her little service dog with her today. 8/17; patient presents for follow-up. She has missed her last clinic appointment. She states she took the wrap off and attempted to rewrap her leg. She is having difficulty with transportation. She has her service dog with her today. Overall she feels well and reports improvement in wound healing. She denies signs of infection. She reports owning an old Velcro wrap compression and has this at her living facility 9/14; patient presents for follow-up. Patient states that over the past 2 to 3 weeks she developed toe wounds to her right foot. She attributes this to tight fitting shoes. She subsequently developed cellulitis in the right leg and has been treated by doxycycline by her oncologist. She reports improvement in symptoms however continues to have some redness and swelling to this leg. To the left lower extremity patient has been having her wraps changed with home health twice weekly. She states that the [facility] is not helping control the drainage. Other than that she has no issues or complaints today. She denies signs of infection to the left lower extremity. 9/21; patient presents for follow-up. She reports seeing infectious disease for her  cellulitis. She reports no further management. She has home health that changes the wraps twice weekly. She has no issues or complaints today. She denies signs of infection. Patient History Information obtained from Patient. Social History Never smoker. Medical History Eyes Denies history of Cataracts, Glaucoma, Optic Neuritis Ear/Nose/Mouth/Throat Denies history of Chronic sinus problems/congestion, Middle ear problems Hematologic/Lymphatic Denies history of Anemia, Hemophilia, Human Immunodeficiency Virus, Lymphedema, Sickle Cell Disease Respiratory Denies history of Aspiration, Asthma, Chronic Obstructive Pulmonary Disease (COPD), Pneumothorax, Sleep Apnea, Tuberculosis Cardiovascular Patient has history of Hypertension Denies history of Angina, Arrhythmia, Congestive Heart Failure, Coronary Artery Disease, Deep Vein Thrombosis, Hypotension, Myocardial Infarction, Peripheral Arterial Disease, Peripheral Venous Disease, Phlebitis, Vasculitis Gastrointestinal Denies history of Cirrhosis , Colitis, Crohn s, Hepatitis A, Hepatitis B, Hepatitis C Endocrine Denies history of Type I Diabetes, Type II Diabetes Genitourinary Denies history of End Stage Renal Disease Immunological Denies history of Lupus Erythematosus, Raynaud s, Scleroderma Integumentary (Skin) Denies history of History of Burn, History of pressure  wounds Musculoskeletal Patient has history of Osteoarthritis Denies history of Gout, Rheumatoid Arthritis, Osteomyelitis Oncologic Patient has history of Received Chemotherapy, Received Radiation Medical And Surgical History Notes Laura Santiago, Laura Santiago ([id number]) Oncologic breast cancer Objective Constitutional Vitals Time Taken: 9:20 AM, Height: 66 in, Weight: 153 lbs, BMI: 24.7, Temperature: 98.9 F, Pulse: 79 bpm, Respiratory Rate: 18 breaths/min, Blood Pressure: 143/67 mmHg. General Notes: Left lower extremity: Open wounds to the medial aspect with granulation  tissue, slough and fibrinous tissue. Less maceration noted. Right lower extremity: No increased warmth or erythema to the leg. To the second and third toe there are scabs from where there were previous open wounds. To the right great toe there is an open wound with granulation tissue and circumferential undermining. No signs of infection seen on exam. Integumentary (Hair, Skin) Wound #1 status is Open. Original cause of wound was Gradually Appeared. The date acquired was: 04/06/2021. The wound has been in treatment 8 weeks. The wound is located on the Left,Medial Lower Leg. The wound measures 9cm length x 10cm width x 0.2cm depth; 70.686cm^2 area and 14.137cm^3 volume. There is Fat Layer (Subcutaneous Tissue) exposed. There is no tunneling or undermining noted. There is a large amount of serosanguineous drainage noted. There is large (67-100%) red granulation within the wound bed. There is a small (1-33%) amount of necrotic tissue within the wound bed including Adherent Slough. Wound #2 status is Open. Original cause of wound was Gradually Appeared. The date acquired was: 08/24/2021. The wound has been in treatment 1 weeks. The wound is located on the Right Toe Great. The wound measures 0.3cm length x 0.4cm width x 0.3cm depth; 0.094cm^2 area and 0.028cm^3 volume. There is Fat Layer (Subcutaneous Tissue) exposed. There is no tunneling or undermining noted. There is a medium amount of serosanguineous drainage noted. There is large (67-100%) red granulation within the wound bed. There is a small (1-33%) amount of necrotic tissue within the wound bed including Adherent Slough. Wound #3 status is Healed - Epithelialized. Original cause of wound was Footwear Injury. The date acquired was: 08/24/2021. The wound has been in treatment 1 weeks. The wound is located on the Right Toe Second. The wound measures 0cm length x 0cm width x 0cm depth; 0cm^2 area and 0cm^3 volume. There is no tunneling or undermining  noted. There is a none present amount of drainage noted. There is no granulation within the wound bed. There is no necrotic tissue within the wound bed. Wound #4 status is Open. Original cause of wound was Footwear Injury. The date acquired was: 08/24/2021. The wound has been in treatment 1 weeks. The wound is located on the Right Toe Third. The wound measures 0.1cm length x 0.1cm width x 0.1cm depth; 0.008cm^2 area and 0.001cm^3 volume. There is Fat Layer (Subcutaneous Tissue) exposed. There is no tunneling or undermining noted. There is a none present amount of drainage noted. There is no granulation within the wound bed. There is no necrotic tissue within the wound bed. Assessment Active Problems ICD-10 Unspecified open wound, left lower leg, subsequent encounter Unspecified open wound of right great toe without damage to nail, subsequent encounter Unspecified open wound of right lesser toe(s) without damage to nail, subsequent encounter Venous insufficiency (chronic) (peripheral) Secondary malignant neoplasm of breast Essential (primary) hypertension Long term (current) use of anticoagulants Patient's right lower extremity redness has improved with the use of Keflex. Her toe wounds have also improved. There are scabs on them. I recommended using antibiotic ointment to The second  and third toes daily. The right great toe has granulation tissue present. I recommended continuing silver alginate to this. I also recommended relieving the pressure by not having tight fitting shoes. She currently wears open toed shoes. To the left lower extremity I debrided nonviable tissue. No signs of infection on exam. I recommended continuing silver alginate with gentamicin. We will continue this under compression wrap. Patient can follow-up in 2 weeks Laura Santiago, Laura Santiago. 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 There was a Excisional Skin/Subcutaneous Tissue Debridement with a total area of 90 sq cm performed by Kalman Shan, MD. With the following instrument(s): Curette to remove Viable and Non-Viable tissue/material. Material removed includes Subcutaneous Tissue and Slough and. A time out was conducted at 09:53, prior to the start of the procedure. A Minimum amount of bleeding was controlled with Pressure. The procedure was tolerated well. Post Debridement Measurements: 9cm length x 10cm width x 0.3cm depth; 21.206cm^3 volume. Character of Wound/Ulcer Post Debridement is stable. Severity of Tissue Post Debridement is: Fat layer exposed. Post procedure Diagnosis Wound #1: Same as Pre-Procedure Plan Follow-up Appointments: Return Appointment in 2 weeks. Home Health: [place]: - [place] for wound care. May utilize formulary equivalent dressing for wound treatment orders unless otherwise specified. Home Health Nurse may visit PRN to address patient s wound care needs. Scheduled days for dressing changes to be completed; exception, patient has scheduled wound care visit that day. **Please direct any NON-WOUND related issues/requests for orders to patient's Primary Care Physician. **If current dressing causes regression in wound condition, may D/C ordered dressing product/s and apply Normal Saline Moist Dressing daily until next [place] or Other MD appointment. **Notify Wound Healing Center of regression in wound condition at [phone]. Bathing/ Shower/ Hygiene: May shower with wound dressing protected with water repellent cover or cast protector. No tub bath. Medications-Please add to medication list.: P.O. Antibiotics - continue keflex WOUND #1: - Lower Leg Wound Laterality: Left, Medial Cleanser: Soap and Water 2 x Per Week/30 Days Discharge Instructions: Gently cleanse wound with  antibacterial soap, rinse and pat dry prior to dressing wounds Peri-Wound Care: Desitin Maximum Strength Ointment 4 (oz) 2 x Per Week/30 Days Discharge Instructions: Apply on macerated area Topical: Gentamicin 2 x Per Week/30 Days Discharge Instructions: Apply as directed by provider. Primary Dressing: Silvercel 4 1/4x 4 1/4 (in/in) 2 x Per Week/30 Days Discharge Instructions: Apply Silvercel 4 1/4x 4 1/4 (in/in) as instructed Secondary Dressing: ABD Pad 5x9 (in/in) (Generic) 2 x Per Week/30 Days Discharge Instructions: Cover with ABD pad Compression Wrap: Profore Lite LF 3 Multilayer Compression Bandaging System 2 x Per Week/30 Days Discharge Instructions: Apply 3 multi-layer wrap as prescribed. WOUND #2: - Toe Great Wound Laterality: Right Cleanser: Wound Cleanser 1 x Per Day/30 Days Discharge Instructions: Wash your hands with soap and water. Remove old dressing, discard into plastic bag and place into trash. Cleanse the wound with Wound Cleanser prior to applying a clean dressing using gauze sponges, not tissues or cotton balls. Do not scrub or use excessive force. Pat dry using gauze sponges, not tissue or cotton balls. Primary Dressing: Silvercel Small 2x2 (in/in) 1 x Per Day/30 Days Discharge Instructions: Apply Silvercel Small 2x2 (in/in) as instructed Secondary Dressing: Gauze 1 x Per Day/30 Days Discharge Instructions: Cover with dry gauze Secured With: 54M Medipore  H Soft Cloth Surgical Tape, 2x2 (in/yd) 1 x Per Day/30 Days Discharge Instructions: Secure dressing WOUND #4: - Toe Third Wound Laterality: Right Cleanser: Wound Cleanser 3 x Per Week/30 Days Discharge Instructions: Wash your hands with soap and water. Remove old dressing, discard into plastic bag and place into trash. Cleanse the wound with Wound Cleanser prior to applying a clean dressing using gauze sponges, not tissues or cotton balls. Do not scrub or use excessive force. Pat dry using gauze sponges, not tissue or  cotton balls. Topical: Triple Antibiotic Ointment, 1 (oz) Tube 3 x Per Week/30 Days Discharge Instructions: Apply on wound Secondary Dressing: Coverlet Latex-Free Fabric Adhesive Dressings 3 x Per Week/30 Days Discharge Instructions: 1.5 x 2 1. Antibiotic ointment to the toes 2. Silver alginate to the right great toe and left lower extremity 3. Gentamicin to the left lower extremity 4. 3 layer compression Laura Santiago, Laura Santiago ([id number]) Electronic Signature(s) Signed: 08/31/2021 12:48:43 PM By: Kalman Shan DO Entered By: Kalman Shan on 08/31/2021 12:47:54 Laura Santiago ([id number]) -------------------------------------------------------------------------------- ROS/PFSH Details Patient Name: Laura Santiago. Date of Service: 08/31/2021 9:00 AM Medical Record Number: [id number] Patient Account Number: [id number] Date of Birth/Sex: 30-Jan-1946 (75 y.o. F) Treating RN: Dolan Amen Primary Care Provider: SYSTEM, PCP Other Clinician: Referring Provider: Randa Evens Treating Provider/Extender: Yaakov Guthrie in Treatment: 8 Information Obtained From Patient Eyes Medical History: Negative for: Cataracts; Glaucoma; Optic Neuritis Ear/Nose/Mouth/Throat Medical History: Negative for: Chronic sinus problems/congestion; Middle ear problems Hematologic/Lymphatic Medical History: Negative for: Anemia; Hemophilia; Human Immunodeficiency Virus; Lymphedema; Sickle Cell Disease Respiratory Medical History: Negative for: Aspiration; Asthma; Chronic Obstructive Pulmonary Disease (COPD); Pneumothorax; Sleep Apnea; Tuberculosis Cardiovascular Medical History: Positive for: Hypertension Negative for: Angina; Arrhythmia; Congestive Heart Failure; Coronary Artery Disease; Deep Vein Thrombosis; Hypotension; Myocardial Infarction; Peripheral Arterial Disease; Peripheral Venous Disease; Phlebitis; Vasculitis Gastrointestinal Medical History: Negative for: Cirrhosis ; Colitis;  Crohnos; Hepatitis A; Hepatitis B; Hepatitis C Endocrine Medical History: Negative for: Type I Diabetes; Type II Diabetes Genitourinary Medical History: Negative for: End Stage Renal Disease Immunological Medical History: Negative for: Lupus Erythematosus; Raynaudos; Scleroderma Integumentary (Skin) Medical History: Negative for: History of Burn; History of pressure wounds Musculoskeletal Medical History: Positive for: Osteoarthritis Laura Santiago, Laura Santiago ([id number]) Negative for: Gout; Rheumatoid Arthritis; Osteomyelitis Oncologic Medical History: Positive for: Received Chemotherapy; Received Radiation Past Medical History Notes: breast cancer Immunizations Pneumococcal Vaccine: Received Pneumococcal Vaccination: No Implantable Devices None Family and Social History Never smoker Electronic Signature(s) Signed: 08/31/2021 12:48:43 PM By: Kalman Shan DO Signed: 08/31/2021 4:25:23 PM By: Dolan Amen RN Entered By: Kalman Shan on 08/31/2021 12:42:13 Laura Santiago, Laura Santiago ([id number]) -------------------------------------------------------------------------------- SuperBill Details Patient Name: Laura Santiago. Date of Service: 08/31/2021 Medical Record Number: [id number] Patient Account Number: [id number] Date of Birth/Sex: 1946-02-05 (75 y.o. 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 I10 Essential (primary)  hypertension Z79.01 Long term (current) use of anticoagulants Facility Procedures CPT4 Code: 85631497 Description: 11042 - DEB SUBQ TISSUE 20 SQ CM/< Modifier: Quantity: 1 CPT4 Code: Description: ICD-10 Diagnosis Description S81.802D Unspecified open wound, left lower leg, subsequent encounter Modifier: Quantity: CPT4  Code: [id number] Description: 69507 - DEB SUBQ TISS EA ADDL 20CM Modifier: Quantity: 4 CPT4 Code: Description: ICD-10 Diagnosis Description S81.802D Unspecified open wound, left lower leg, subsequent encounter Modifier: Quantity: Physician Procedures CPT4 Code Description: 2257505 99213 - WC PHYS LEVEL 3 - EST PT Modifier: Quantity: 1 CPT4 Code Description: ICD-10 Diagnosis Description S91.101D Unspecified open wound of right great toe without damage to nail, subsequ S91.104D Unspecified open wound of right lesser toe(s) without damage to nail, sub S81.802D Unspecified open wound,  left lower leg, subsequent encounter I87.2 Venous insufficiency (chronic) (peripheral) Modifier: ent encounter sequent encounter Quantity: CPT4 Code Description: [id number] 11042 - WC PHYS SUBQ TISS 20 SQ CM Modifier: Quantity: 1 CPT4 Code Description: ICD-10 Diagnosis Description S81.802D Unspecified open wound, left lower leg, subsequent encounter Modifier: Quantity: CPT4 Code Description: [id number] 11045 - WC PHYS SUBQ TISS EA ADDL 20 CM Modifier: Quantity: 4 CPT4 Code Description: ICD-10 Diagnosis Description S81.802D Unspecified open wound, left lower leg, subsequent encounter Modifier: Quantity: Electronic Signature(s) Signed: 08/31/2021 12:48:43 PM By: Kalman Shan DO Entered By: Kalman Shan on 08/31/2021 12:48:21

## 2021-08-31 NOTE — Telephone Encounter (Signed)
Called the daughter yest. And left her a message that we are close to get getting the imaging issue to go through. I am speaking to a lady named [place] and she is helping me with getting the images power share to the hospital so that we can compare them. Marland Kitchen

## 2021-09-05 ENCOUNTER — Other Ambulatory Visit: Payer: Self-pay

## 2021-09-05 ENCOUNTER — Ambulatory Visit
Admission: RE | Admit: 2021-09-05 | Discharge: 2021-09-05 | Disposition: A | Discharge status: Home / Self Care | Payer: Medicare Other | Source: Home / Self Care | Attending: Oncology | Admitting: Oncology

## 2021-09-05 ENCOUNTER — Ambulatory Visit
Admission: RE | Admit: 2021-09-05 | Discharge: 2021-09-05 | Disposition: A | Discharge status: Home / Self Care | Payer: Medicare Other | Source: Ambulatory Visit | Attending: Oncology | Admitting: Oncology

## 2021-09-05 DIAGNOSIS — C50919 Malignant neoplasm of unspecified site of unspecified female breast: Secondary | ICD-10-CM

## 2021-09-05 DIAGNOSIS — R948 Abnormal results of function studies of other organs and systems: Secondary | ICD-10-CM | POA: Diagnosis present

## 2021-09-08 ENCOUNTER — Other Ambulatory Visit: Payer: Self-pay

## 2021-09-08 ENCOUNTER — Telehealth: Payer: Self-pay

## 2021-09-08 ENCOUNTER — Telehealth: Payer: Medicare Other | Admitting: Infectious Diseases

## 2021-09-08 NOTE — Telephone Encounter (Signed)
Attempted to call patient multiple  times to begin phone visit for today at 145. Not able to reach patient at this time. Will need to reschedule appointment.  Leatrice Jewels, RMA

## 2021-09-09 ENCOUNTER — Telehealth: Payer: Self-pay | Admitting: *Deleted

## 2021-09-09 ENCOUNTER — Other Ambulatory Visit: Payer: Self-pay

## 2021-09-09 ENCOUNTER — Encounter: Payer: Self-pay | Admitting: *Deleted

## 2021-09-09 ENCOUNTER — Inpatient Hospital Stay: Payer: Medicare Other

## 2021-09-09 ENCOUNTER — Other Ambulatory Visit: Payer: Self-pay | Admitting: *Deleted

## 2021-09-09 ENCOUNTER — Inpatient Hospital Stay (HOSPITAL_BASED_OUTPATIENT_CLINIC_OR_DEPARTMENT_OTHER): Payer: Medicare Other | Admitting: Oncology

## 2021-09-09 VITALS — BP 110/68 | HR 76 | Temp 98.1°F | Wt 163.2 lb

## 2021-09-09 DIAGNOSIS — G893 Neoplasm related pain (acute) (chronic): Secondary | ICD-10-CM

## 2021-09-09 DIAGNOSIS — R233 Spontaneous ecchymoses: Secondary | ICD-10-CM

## 2021-09-09 DIAGNOSIS — C50919 Malignant neoplasm of unspecified site of unspecified female breast: Secondary | ICD-10-CM | POA: Diagnosis not present

## 2021-09-09 DIAGNOSIS — C779 Secondary and unspecified malignant neoplasm of lymph node, unspecified: Secondary | ICD-10-CM

## 2021-09-09 DIAGNOSIS — Z7901 Long term (current) use of anticoagulants: Secondary | ICD-10-CM

## 2021-09-09 DIAGNOSIS — C7951 Secondary malignant neoplasm of bone: Secondary | ICD-10-CM

## 2021-09-09 DIAGNOSIS — Z7983 Long term (current) use of bisphosphonates: Secondary | ICD-10-CM | POA: Diagnosis not present

## 2021-09-09 DIAGNOSIS — Z5112 Encounter for antineoplastic immunotherapy: Secondary | ICD-10-CM

## 2021-09-09 DIAGNOSIS — Z5181 Encounter for therapeutic drug level monitoring: Secondary | ICD-10-CM

## 2021-09-09 DIAGNOSIS — Z5111 Encounter for antineoplastic chemotherapy: Secondary | ICD-10-CM

## 2021-09-09 LAB — CBC WITH DIFFERENTIAL/PLATELET
Abs Immature Granulocytes: 0.04 10*3/uL (ref 0.00–0.07)
Basophils Absolute: 0 10*3/uL (ref 0.0–0.1)
Basophils Relative: 1 %
Eosinophils Absolute: 0.1 10*3/uL (ref 0.0–0.5)
Eosinophils Relative: 2 %
HCT: 30.4 % — ABNORMAL LOW (ref 36.0–46.0)
Hemoglobin: 9.3 g/dL — ABNORMAL LOW (ref 12.0–15.0)
Immature Granulocytes: 1 %
Lymphocytes Relative: 18 %
Lymphs Abs: 1 10*3/uL (ref 0.7–4.0)
MCH: 29.2 pg (ref 26.0–34.0)
MCHC: 30.6 g/dL (ref 30.0–36.0)
MCV: 95.6 fL (ref 80.0–100.0)
Monocytes Absolute: 0.5 10*3/uL (ref 0.1–1.0)
Monocytes Relative: 9 %
Neutro Abs: 3.9 10*3/uL (ref 1.7–7.7)
Neutrophils Relative %: 69 %
Platelets: 260 10*3/uL (ref 150–400)
RBC: 3.18 MIL/uL — ABNORMAL LOW (ref 3.87–5.11)
RDW: 15.9 % — ABNORMAL HIGH (ref 11.5–15.5)
WBC: 5.5 10*3/uL (ref 4.0–10.5)
nRBC: 0 % (ref 0.0–0.2)

## 2021-09-09 LAB — COMPREHENSIVE METABOLIC PANEL
ALT: 11 U/L (ref 0–44)
AST: 18 U/L (ref 15–41)
Albumin: 3.6 g/dL (ref 3.5–5.0)
Alkaline Phosphatase: 67 U/L (ref 38–126)
Anion gap: 7 (ref 5–15)
BUN: 25 mg/dL — ABNORMAL HIGH (ref 8–23)
CO2: 26 mmol/L (ref 22–32)
Calcium: 8.5 mg/dL — ABNORMAL LOW (ref 8.9–10.3)
Chloride: 105 mmol/L (ref 98–111)
Creatinine, Ser: 1.24 mg/dL — ABNORMAL HIGH (ref 0.44–1.00)
GFR, Estimated: 45 mL/min — ABNORMAL LOW (ref >60)
Glucose, Bld: 115 mg/dL — ABNORMAL HIGH (ref 70–99)
Potassium: 3.9 mmol/L (ref 3.5–5.1)
Sodium: 138 mmol/L (ref 135–145)
Total Bilirubin: 0.2 mg/dL — ABNORMAL LOW (ref 0.3–1.2)
Total Protein: 6.8 g/dL (ref 6.5–8.1)

## 2021-09-09 MED ORDER — SODIUM CHLORIDE 0.9 % IV SOLN: 6.0000 mg/kg | Freq: Once | INTRAVENOUS | Status: DC

## 2021-09-09 MED ORDER — SODIUM CHLORIDE 0.9 % IV SOLN
10.0000 mg | Freq: Once | INTRAVENOUS | Status: AC
  Administered 2021-09-09: 10 mg via INTRAVENOUS
  Filled 2021-09-09: qty 10

## 2021-09-09 MED ORDER — FAMOTIDINE 20 MG IN NS 100 ML IVPB
20.0000 mg | Freq: Once | INTRAVENOUS | Status: AC
  Administered 2021-09-09: 20 mg via INTRAVENOUS
  Filled 2021-09-09: qty 20

## 2021-09-09 MED ORDER — PACLITAXEL CHEMO INJECTION 300 MG/50ML: 65.0000 mg/m2 | Freq: Once | INTRAVENOUS | Status: DC

## 2021-09-09 MED ORDER — ACETAMINOPHEN 325 MG PO TABS: 650.0000 mg | ORAL_TABLET | Freq: Once | ORAL | Status: DC

## 2021-09-09 MED ORDER — ZOLEDRONIC ACID 4 MG/5ML IV CONC
3.3000 mg | Freq: Once | INTRAVENOUS | Status: AC
  Administered 2021-09-09: 3.3 mg via INTRAVENOUS
  Filled 2021-09-09: qty 4.13

## 2021-09-09 MED ORDER — PERTUZUMAB CHEMO INJECTION 420 MG/14ML: 420.0000 mg | Freq: Once | INTRAVENOUS | Status: DC

## 2021-09-09 MED ORDER — SODIUM CHLORIDE 0.9 % IV SOLN
114.0000 mg | Freq: Once | INTRAVENOUS | Status: AC
  Administered 2021-09-09: 114 mg via INTRAVENOUS
  Filled 2021-09-09: qty 19

## 2021-09-09 MED ORDER — HEPARIN SOD (PORK) LOCK FLUSH 100 UNIT/ML IV SOLN
INTRAVENOUS | Status: AC
  Filled 2021-09-09: qty 5

## 2021-09-09 MED ORDER — POTASSIUM CHLORIDE CRYS ER 20 MEQ PO TBCR: 20.0000 meq | EXTENDED_RELEASE_TABLET | Freq: Every day | ORAL | 0 refills | Status: DC

## 2021-09-09 MED ORDER — TRASTUZUMAB-DKST CHEMO 150 MG IV SOLR
420.0000 mg | Freq: Once | INTRAVENOUS | Status: AC
  Administered 2021-09-09: 420 mg via INTRAVENOUS
  Filled 2021-09-09: qty 20

## 2021-09-09 MED ORDER — DIPHENHYDRAMINE HCL 50 MG/ML IJ SOLN: 50.0000 mg | Freq: Once | INTRAMUSCULAR | Status: AC

## 2021-09-09 MED ORDER — SODIUM CHLORIDE 0.9 % IV SOLN: 10.0000 mg | Freq: Once | INTRAVENOUS | Status: DC

## 2021-09-09 MED ORDER — SODIUM CHLORIDE 0.9 % IV SOLN
Freq: Once | INTRAVENOUS | Status: AC
  Filled 2021-09-09: qty 250

## 2021-09-09 MED ORDER — ACETAMINOPHEN 325 MG PO TABS
650.0000 mg | ORAL_TABLET | Freq: Once | ORAL | Status: AC
  Administered 2021-09-09: 650 mg via ORAL
  Filled 2021-09-09: qty 2

## 2021-09-09 MED ORDER — ZOLEDRONIC ACID 4 MG/100ML IV SOLN: 4.0000 mg | INTRAVENOUS | Status: DC

## 2021-09-09 MED ORDER — DIPHENHYDRAMINE HCL 50 MG/ML IJ SOLN
50.0000 mg | Freq: Once | INTRAMUSCULAR | Status: AC
  Administered 2021-09-09: 50 mg via INTRAVENOUS
  Filled 2021-09-09: qty 1

## 2021-09-09 MED ORDER — SODIUM CHLORIDE 0.9 % IV SOLN
420.0000 mg | Freq: Once | INTRAVENOUS | Status: AC
  Administered 2021-09-09: 420 mg via INTRAVENOUS
  Filled 2021-09-09: qty 14

## 2021-09-09 MED ORDER — FAMOTIDINE 20 MG IN NS 100 ML IVPB: 20.0000 mg | Freq: Once | INTRAVENOUS | Status: DC

## 2021-09-09 NOTE — Patient Instructions (Signed)
[facility]  Discharge Instructions: Thank you for choosing [facility] to provide your oncology and hematology care.  If you have a lab appointment with the [facility], please go directly to the [facility] and check in at the registration area.  Wear comfortable clothing and clothing appropriate for easy access to any Portacath or PICC line.   We strive to give you quality time with your provider. You may need to reschedule your appointment if you arrive late (15 or more minutes).  Arriving late affects you and other patients whose appointments are after yours.  Also, if you miss three or more appointments without notifying the office, you may be dismissed from the clinic at the provider's discretion.      For prescription refill requests, have your pharmacy contact our office and allow 72 hours for refills to be completed.    Today you received the following chemotherapy and/or immunotherapy agents : Herceptin / Perjeta/ Taxol   To help prevent nausea and vomiting after your treatment, we encourage you to take your nausea medication as directed.  BELOW ARE SYMPTOMS THAT SHOULD BE REPORTED IMMEDIATELY: *FEVER GREATER THAN 100.4 F (38 C) OR HIGHER *CHILLS OR SWEATING *NAUSEA AND VOMITING THAT IS NOT CONTROLLED WITH YOUR NAUSEA MEDICATION *UNUSUAL SHORTNESS OF BREATH *UNUSUAL BRUISING OR BLEEDING *URINARY PROBLEMS (pain or burning when urinating, or frequent urination) *BOWEL PROBLEMS (unusual diarrhea, constipation, pain near the anus) TENDERNESS IN MOUTH AND THROAT WITH OR WITHOUT PRESENCE OF ULCERS (sore throat, sores in mouth, or a toothache) UNUSUAL RASH, SWELLING OR PAIN  UNUSUAL VAGINAL DISCHARGE OR ITCHING   Items with * indicate a potential emergency and should be followed up as soon as possible or go to the Emergency Department if any problems should occur.  Please show the CHEMOTHERAPY ALERT CARD or IMMUNOTHERAPY ALERT  CARD at check-in to the Emergency Department and triage nurse.  Should you have questions after your visit or need to cancel or reschedule your appointment, please contact [facility]  [phone] and follow the prompts.  Office hours are 8:00 a.m. to 4:30 p.m. Monday - Friday. Please note that voicemails left after 4:00 p.m. may not be returned until the following business day.  We are closed weekends and major holidays. You have access to a nurse at all times for urgent questions. Please call the main number to the clinic [phone] and follow the prompts.  For any non-urgent questions, you may also contact your provider using MyChart. We now offer e-Visits for anyone 52 and older to request care online for non-urgent symptoms. For details visit [email or web address].   Also download the MyChart app! Go to the app store, search "MyChart", open the app, select [facility], and log in with your MyChart username and password.  Due to Covid, a mask is required upon entering the hospital/clinic. If you do not have a mask, one will be given to you upon arrival. For doctor visits, patients may have 1 support person aged 85 or older with them. For treatment visits, patients cannot have anyone with them due to current Covid guidelines and our immunocompromised population.

## 2021-09-09 NOTE — Progress Notes (Signed)
Pt requesting lisinopril refill. Pt questioning need for continuing xarelto. Pt c/o difficulty with holding on to things with her hands.

## 2021-09-09 NOTE — Telephone Encounter (Signed)
Today when pt came in she was showing dr Janese Banks hypopigmentation spots on her face, chest and arms. She wanted a referral to dermatology. Dr. Janese Banks did not feel like it was anything but pt wanted skin doctor to check it. Also pt's potassium was low 3.2. Dr. Janese Banks has sent rx to her pharmacy for 1 potassium pill a day for 1 week. Sent this info to the my chart that her daughter [name] helps her with

## 2021-09-11 ENCOUNTER — Encounter: Payer: Self-pay | Admitting: Oncology

## 2021-09-11 NOTE — Progress Notes (Signed)
Hematology/Oncology Consult note 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  She had a screening mammogram in September 2014 which showed 2.1 x 2.3 x 1.8 cm irregular mass in her right breast.  It was ER 10% positive PR negative and HER2 positive +3.  She received neoadjuvant chemotherapy with Taxol Herceptin and Perjeta for 4 cycles followed by dose dense AC/Herceptin x4 which she completed in March 2015.  She had a right lumpectomy on 04/03/2014 which showed scant residual invasive ductal carcinoma [id number].  She completed 1 year of adjuvant Herceptin chemotherapy and also completed adjuvant radiation treatment.  She was recommended anastrozole which she took on and off starting November 2015 and stopped sometime in 2020.   She was then hospitalized with neck pain and was found to have lytic lesions involving C5-C6 with pathological vertebral fractures.  She underwent radiation treatment to this area.  Image guided biopsy of the L1 vertebral body showed metastatic carcinoma consistent with breast origin ER 10% PR 0% and HER2 amplified ratio 5.1 average HER2 signal number per cell 15.0 average CEP 17 signals number per cell 3.0.  Baseline echocardiogram on 05/02/2021 showed a normal EF of 62% she was recommended Taxol Herceptin and Perjeta which she received for 2 cycles at [facility] until June 10, 2021   She has chronic pain from her bone metastases for which she is currently on oxycodone 10 mg every 4 hours as needed and 12 mcg fentanyl patch.  She was seeing pain clinic when she was living in [name].   Patient has also been on Zometa when she was in [name] but she does have some ongoing dental issues.  She has received Xgeva in the past as well.  Her last Delton See was in July 2021.  Last PET scan was on 03/21/2021 which showed diffuse osseous metastatic disease involving the head neck chest abdomen and pelvis and spine.  Left lung apex hypermetabolic nodule and multiple hypermetabolic liver lesions concerning for disease involvement.  Enlarged hypermetabolic left inguinal lymph nodes along with hypermetabolic external iliac and left supraclavicular lymph nodes   Patient is now moved to [place] to be close to her daughter.  She lives in an independent living.    Interval history-patient is concerned about hypopigmented patchy appearing of her skin which is pretty much diffuse throughout her body and likely chronic.  She was also evaluated by ID for her right lower extremity swelling which is believed to be secondary to lymphedema.  She has completed her course of antibiotic.  She has chronic left lower extremity wounds which are being managed by wound clinic  ECOG PS- 1 Pain scale- 0   Review of systems- Review of Systems  Constitutional:  Positive for malaise/fatigue. Negative for chills, fever and weight loss.  HENT:  Negative for congestion, ear discharge and nosebleeds.   Eyes:  Negative for blurred vision.  Respiratory:  Negative for cough,  hemoptysis, sputum production, shortness of breath and wheezing.   Cardiovascular:  Negative for chest pain, palpitations, orthopnea and claudication.  Gastrointestinal:  Negative for abdominal pain, blood in stool, constipation, diarrhea, heartburn, melena, nausea and vomiting.  Genitourinary:  Negative for dysuria, flank pain, frequency,  hematuria and urgency.  Musculoskeletal:  Negative for back pain, joint pain and myalgias.       Left lower extremity wounds.  Right lower extremity swelling  Skin:  Negative for rash.  Neurological:  Negative for dizziness, tingling, focal weakness, seizures, weakness and headaches.  Endo/Heme/Allergies:  Does not bruise/bleed easily.  Psychiatric/Behavioral:  Negative for depression and suicidal ideas. The patient does not have insomnia.       Allergies  Allergen Reactions   Corticosteroids Other (See Comments)    Pt trf from [name] and per primary md for her cancer tx. Notes that it causes agitation intolerance   Sulfa Antibiotics Other (See Comments)    Pt moved from [name] and in MD notes she has allergy but we do not know reactions when taking the drug   Celebrex [Celecoxib] Rash     Past Medical History:  Diagnosis Date   ADHD (attention deficit hyperactivity disorder)    in [place], no date on md note   Anemia    IDA 11/26/2019, Anemia in stage 4 chronic kidney disease ([place]) 09/03/2020   Arthritis    osteoarthritis right knee 09/30/2014   Breast cancer ([place]) 10/05/2013   in [place], [place] -, Her 2 is 3+   Cancer related pain 03/28/2021   in [name], md notes spine mets   DVT of lower extremity, bilateral ([place]) 03/30/2014   in [name]   Generalized muscle weakness 03/31/2016   in [name]   GERD (gastroesophageal reflux disease) 05/15/2013   per md in [name]   Hyperparathyroidism, secondary ([place]) 04/25/2018   in [name]   Hypertension 02/20/2016   info from MD in [facility]   Lumbar compression fracture ([place]) 03/18/2021   in [place] loss 02/05/2015   in [name]   Metabolic acidosis 81/82/9937   in [place]   Metabolic syndrome 16/96/7893   in [name]   Osteopenia after menopause 03/29/2016   in [place] of both eyes 09/30/2012   per md in [name] where pt. 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   in [name]   Stasis ulcer of left lower extremity ([place])  03/29/2016   in [name]     Past Surgical History:  Procedure Laterality Date   CESAREAN SECTION     unknown   fibroid removed  N/A    in [place] - unknown date   IR FLUORO GUIDE CV LINE LEFT  07/27/2021   IR PORT REPAIR CENTRAL VENOUS ACCESS DEVICE Left    In [name] N/A 02/25/2014   in [name]   ovary removed      unknown   [place] CATARACT EXTRACAP,INSERT LENS Bilateral  Bilateral 09/05/2012   in [place]     unknown    LUMPECTOMY Right 04/03/2014   in [place]    Social History   Socioeconomic History   Marital status: Divorced    Spouse name: Not on file   Number of children: Not on file   Years of education: Not on file   Highest education level: Not on file  Occupational History   Occupation: retired Teacher, music    Comment: In [place]  Tobacco Use   Smoking status: Never   Smokeless tobacco: Never  Vaping Use   Vaping Use: Never used  Substance and Sexual Activity   Alcohol use: Not Currently   Drug use: Never   Sexual activity: Not Currently  Other Topics Concern   Not on file  Social History Narrative   Not on file   Social Determinants of Health   Financial Resource Strain: Not on file  Food Insecurity: Not on file  Transportation Needs: Not on file  Physical Activity: Not on file  Stress: Not on file  Social Connections: Not on file  Intimate Partner Violence: Not on file    Family History  Problem Relation Age of Onset   Pancreatic cancer Mother    Stroke Father    Diabetes Father    Hypertension Father    Heart disease Father    Skin cancer Father    Varicose Veins Father    Skin cancer Brother    Cancer - Prostate Brother      Current Outpatient Medications:    amphetamine-dextroamphetamine (ADDERALL XR) 30 MG 24 hr capsule, Take 1 capsule (30 mg total) by mouth daily., Disp: 30 capsule, Rfl: 0   Calcium 200 MG TABS, Take 1 tablet by mouth daily., Disp: , Rfl:    fentaNYL (DURAGESIC) 12 MCG/HR, Place 1  patch onto the skin every 3 (three) days., Disp: 10 patch, Rfl: 0   gabapentin (NEURONTIN) 400 MG capsule, Take 400 mg by mouth 2 (two) times daily., Disp: , Rfl:    lisinopril (ZESTRIL) 20 MG tablet, Take 20 mg by mouth daily., Disp: , Rfl:    ondansetron (ZOFRAN) 8 MG tablet, Take by mouth., Disp: , Rfl:    Oxycodone HCl 10 MG TABS, Take 1 tablet (10 mg total) by mouth every 4 (four) hours as needed., Disp: 84 tablet, Rfl: 0   rivaroxaban (XARELTO) 20 MG TABS tablet, Take 1 tablet (20 mg total) by mouth daily with supper., Disp: 30 tablet, Rfl: 3   terbinafine (LAMISIL AT) 1 % cream, Apply 1 application topically 2 (two) times daily., Disp: 30 g, Rfl: 5   zolpidem (AMBIEN) 5 MG tablet, Take 5 mg by mouth at bedtime as needed for sleep., Disp: , Rfl:    cephALEXin (KEFLEX) 500 MG capsule, Take 500 mg by mouth 2 (two) times daily. (Patient not taking: Reported on 09/09/2021), Disp: , Rfl:    Multiple Vitamin (MULTIVITAMIN ADULT PO), Take 1 tablet by mouth daily. (Patient not taking: No sig reported), Disp: , Rfl:    potassium chloride SA (KLOR-CON) 20 MEQ tablet, Take 1 tablet (20 mEq total) by mouth daily., Disp: 7 tablet, Rfl: 0  Physical exam:  Vitals:   09/09/21 1000  BP: 110/68  Pulse: 76  Temp: 98.1 F (36.7 C)  SpO2: 99%  Weight: 163 lb 3.2 oz (74 kg)   Physical Exam Cardiovascular:     Rate and Rhythm: Normal rate and regular rhythm.     Heart sounds: Normal heart sounds.  Pulmonary:     Effort: Pulmonary effort is normal.     Breath sounds: Normal breath sounds.  Abdominal:     General: Bowel sounds are normal.     Palpations: Abdomen is soft.  Skin:    General: Skin is warm and dry.     Comments: Patient has diffuse hypopigmented areas which has a generalized appearance throughout her body.    Neurological:     Mental Status: She is alert and  oriented to person, place, and time.     CMP Latest Ref Rng & Units 09/09/2021  Glucose 70 - 99 mg/dL 115(H)  BUN 8 - 23 mg/dL  25(H)  Creatinine 0.44 - 1.00 mg/dL 1.24(H)  Sodium 135 - 145 mmol/L 138  Potassium 3.5 - 5.1 mmol/L 3.9  Chloride 98 - 111 mmol/L 105  CO2 22 - 32 mmol/L 26  Calcium 8.9 - 10.3 mg/dL 8.5(L)  Total Protein 6.5 - 8.1 g/dL 6.8  Total Bilirubin 0.3 - 1.2 mg/dL 0.2(L)  Alkaline Phos 38 - 126 U/L 67  AST 15 - 41 U/L 18  ALT 0 - 44 U/L 11   CBC Latest Ref Rng & Units 09/09/2021  WBC 4.0 - 10.5 K/uL 5.5  Hemoglobin 12.0 - 15.0 g/dL 9.3(L)  Hematocrit 36.0 - 46.0 % 30.4(L)  Platelets 150 - 400 K/uL 260    No images are attached to the encounter.  CT Chest Wo Contrast  Result Date: 08/31/2021 CLINICAL DATA:  Breast cancer EXAM: CT CHEST WITHOUT CONTRAST TECHNIQUE: Multidetector CT imaging of the chest was performed following the standard protocol without IV contrast. COMPARISON:  None. FINDINGS: Cardiovascular: Left chest port catheter. Scattered aortic atherosclerosis. Normal heart size. No pericardial effusion. Mediastinum/Nodes: No enlarged mediastinal, hilar, or axillary lymph nodes. Thyroid gland, trachea, and esophagus demonstrate no significant findings. Lungs/Pleura: There is minimal, irregular paramedian upper lobe interstitial opacity and ground-glass (series 3, image 35, 27). Mild subpleural radiation fibrosis of the anterior right upper lobe (series 3, image 59). No pleural effusion or pneumothorax. Upper Abdomen: No acute abnormality. Musculoskeletal: Extensive sclerotic osseous metastatic disease involving the vertebral bodies, sternum, and head of the right clavicle. Surgical clips in the right breast and axilla. IMPRESSION: 1. There is minimal, irregular paramedian upper lobe interstitial opacity and ground-glass , nonspecific and infectious or inflammatory, appearance suggesting minimal radiation pneumonitis. Attention on follow-up. 2. Mild subpleural radiation fibrosis of the anterior right upper lobe. 3. Extensive sclerotic osseous metastatic disease involving the vertebral bodies,  sternum, and head of the right clavicle. Aortic Atherosclerosis (ICD10-I70.0). Electronically Signed   By: Eddie Candle M.D.   On: 08/31/2021 15:29   US Venous Img Lower Unilateral Right  Result Date: 09/05/2021 CLINICAL DATA:  pt has wound on right leg and toe has a sore, pt has breast cancer metastatic EXAM: RIGHT LOWER EXTREMITY VENOUS DOPPLER ULTRASOUND TECHNIQUE: Gray-scale sonography with graded compression, as well as color Doppler and duplex ultrasound were performed to evaluate the lower extremity deep venous systems from the level of the common femoral vein and including the common femoral, femoral, profunda femoral, popliteal and calf veins including the posterior tibial, peroneal and gastrocnemius veins when visible. The superficial great saphenous vein was also interrogated. Spectral Doppler was utilized to evaluate flow at rest and with distal augmentation maneuvers in the common femoral, femoral and popliteal veins. COMPARISON:  None. FINDINGS: Contralateral Common Femoral Vein: Respiratory phasicity is normal and symmetric with the symptomatic side. No evidence of thrombus. Normal compressibility. Common Femoral Vein: No evidence of thrombus. Normal compressibility, respiratory phasicity and response to augmentation. Saphenofemoral Junction: No evidence of thrombus. Normal compressibility and flow on color Doppler imaging. Profunda Femoral Vein: No evidence of thrombus. Normal compressibility and flow on color Doppler imaging. Femoral Vein: No evidence of thrombus. Normal compressibility, respiratory phasicity and response to augmentation. Popliteal Vein: No evidence of thrombus. Normal compressibility, respiratory phasicity and response to augmentation. Calf Veins: No evidence of thrombus. Normal compressibility and flow on color Doppler  imaging. Other Findings:  None. IMPRESSION: No evidence of deep venous thrombosis. Electronically Signed   By: Albin Felling M.D.   On: 09/05/2021 14:51   DG  Humerus Right  Result Date: 09/06/2021 CLINICAL DATA:  History of metastatic breast cancer, abnormal bone scan EXAM: RIGHT HUMERUS - 2+ VIEW COMPARISON:  08/04/2021 FINDINGS: Frontal and lateral views of the right humerus are obtained. There are no acute displaced fractures. There is a subtle cortical lucency along the proximal humeral metadiaphyseal junction corresponding to the bone scan findings of metastatic disease. No evidence of impending fracture. Severe osteoarthritis of the right shoulder. IMPRESSION: 1. Subtle cortical lucency proximal humeral metadiaphysis, consistent with known bony metastatic disease. No evidence of impending or pathologic fracture. Electronically Signed   By: Randa Ngo M.D.   On: 09/06/2021 22:39   DG Foot Complete Right  Result Date: 08/25/2021 CLINICAL DATA:  Nonhealing wound to the RIGHT lower extremity. Wound over great toe for proximally 10 years. EXAM: RIGHT FOOT COMPLETE - 3+ VIEW COMPARISON:  August 04, 2021.  Bone scan evaluation. FINDINGS: Dressing over the great toe. Small amount of gas along the margin of the great toe, medial soft tissues likely the site of reported ulceration. Soft tissue swelling in the area. No destructive bony changes. No acute bone process with mild midfoot degenerative changes. IMPRESSION: Soft tissue swelling and small amount of gas along the margin of the great toe, likely the site of reported ulceration. No destructive bony changes, no acute bone findings. Electronically Signed   By: Zetta Bills M.D.   On: 08/25/2021 10:45     Assessment and plan- Patient is a 75 y.o. female with metastatic weakly ER positive HER2 positive breast cancer with liver lymph node and bone metastases here for on treatment assessment prior to cycle 4-day 1 of Taxol Herceptin and Perjeta  Counts okay to proceed with cycle 4-day 1 of Taxol Herceptin and Perjeta today.  She will proceed for Taxol treatment alone next week and I will see her back in 3  weeks for cycle 5-day 1 of Taxol Herceptin and Perjeta.  In the area to receive outside scans for comparison and therefore we are unable to conclude from the present scans as to how she has responded.  Plan is to repeat another CT and bone scan in early November 2022.  I had given her Taxol 2 weeks on and 1 week off since she was complaining of peripheral neuropathy.  Patient wishes to go back to 3 weeks of weekly Taxol which I do not think will benefit her in the long run given her problems with balance and pre-existing peripheral neuropathy.  I would like to stick to the present regimen of 2 weeks on and 1 week off.  Right lower extremity swelling: She has a prior history of DVT and is on chronic Xarelto.  Repeat ultrasound did not show any DVT.  She has gone through 2 rounds of antibiotics and also seen by ID.  The cause of her right lower extremity swelling is believed to be lymphedema and patient will is going to be referred to vascular surgery.  Bone metastases: We will get Zometa today  Neoplasm related pain: Continue fentanyl patch and as needed oxycodone  Patient has diffuse hypopigmented patchy appearance of her skin which does not appear to be secondary to an underlying dermatological disorder but patient insists on seeing dermatology.  She also wants to see dermatology to see if she has possible fungal infection of  her toes that is leading to right lower extremity swelling.  I am making a referral to dermatology at this time   Visit Diagnosis 1. Metastatic breast cancer ([place])   2. Encounter for antineoplastic chemotherapy   3. Encounter for monitoring zoledronic acid therapy   4. Current use of long term anticoagulation   5. Neoplasm related pain   6. Encounter for monoclonal antibody treatment for malignancy      Dr. Randa Evens, MD, MPH [facility] [phone] 09/11/2021 6:11 AM

## 2021-09-12 ENCOUNTER — Telehealth: Payer: Self-pay

## 2021-09-12 NOTE — Telephone Encounter (Signed)
Spoke to [employer] at [facility] in regards to comparing pts CT scans from [place] and [name]. Red stated it will get worked on as soon as the doctor returns from vacation on Monday. Not needed STAT so Monday is okay.

## 2021-09-14 ENCOUNTER — Encounter: Payer: Medicare Other | Attending: Internal Medicine | Admitting: Internal Medicine

## 2021-09-14 ENCOUNTER — Other Ambulatory Visit: Payer: Self-pay

## 2021-09-14 DIAGNOSIS — N184 Chronic kidney disease, stage 4 (severe): Secondary | ICD-10-CM | POA: Insufficient documentation

## 2021-09-14 DIAGNOSIS — X58XXXA Exposure to other specified factors, initial encounter: Secondary | ICD-10-CM | POA: Diagnosis not present

## 2021-09-14 DIAGNOSIS — S81802D Unspecified open wound, left lower leg, subsequent encounter: Secondary | ICD-10-CM | POA: Diagnosis not present

## 2021-09-14 DIAGNOSIS — Z853 Personal history of malignant neoplasm of breast: Secondary | ICD-10-CM | POA: Diagnosis not present

## 2021-09-14 DIAGNOSIS — I129 Hypertensive chronic kidney disease with stage 1 through stage 4 chronic kidney disease, or unspecified chronic kidney disease: Secondary | ICD-10-CM | POA: Insufficient documentation

## 2021-09-14 DIAGNOSIS — S91101A Unspecified open wound of right great toe without damage to nail, initial encounter: Secondary | ICD-10-CM | POA: Insufficient documentation

## 2021-09-14 DIAGNOSIS — Z86718 Personal history of other venous thrombosis and embolism: Secondary | ICD-10-CM | POA: Diagnosis not present

## 2021-09-14 DIAGNOSIS — S81802A Unspecified open wound, left lower leg, initial encounter: Secondary | ICD-10-CM | POA: Diagnosis not present

## 2021-09-14 DIAGNOSIS — Z7901 Long term (current) use of anticoagulants: Secondary | ICD-10-CM | POA: Diagnosis not present

## 2021-09-15 ENCOUNTER — Telehealth: Payer: Self-pay | Admitting: *Deleted

## 2021-09-15 NOTE — Telephone Encounter (Signed)
Received a call from [place], patient daughter reporting that patient is for treatment tomorrow and she suspects that she had a reaction to her last treatment on 9/30. She states that that same evening, patient had a "psychotic  episode" where her scalp was crawling as well as her skin and that her eyes watered continuously  and this all lasted for 2 days. She is concerned that this may happen again tomorrow and would like to discuss this with someone before her treatment tomorrow. I called and spoke with Lorretta Harp, NP and she will call the daughter this evening and discuss this

## 2021-09-15 NOTE — Progress Notes (Signed)
RYIN, SCHILLO ([id number]) Visit Report for 09/14/2021 Arrival Information Details Patient Name: Laura Santiago, Laura Santiago. Date of Service: 09/14/2021 10:00 AM Medical Record Number: [id number] Patient Account Number: [id number] Date of Birth/Sex: Nov 08, 1946 (75 y.o. F) Treating RN: Dolan Amen Primary Care Paige Vanderwoude: SYSTEM, PCP Other Clinician: Referring Maryln Eastham: Randa Evens Treating Mattox Schorr/Extender: Yaakov Guthrie in Treatment: 10 Visit Information History Since Last Visit Pain Present Now: No Patient Arrived: Walker Arrival Time: 09:58 Accompanied By: self Transfer Assistance: None Patient Identification Verified: Yes Secondary Verification Process Completed: Yes Patient Has Alerts: Yes Patient Alerts: PT HAS SERVICE ANIMAL Electronic Signature(s) Signed: 09/15/2021 1:42:44 PM By: Dolan Amen RN Entered By: Dolan Amen on 09/14/2021 09:58:37 Laura Santiago ([id number]) -------------------------------------------------------------------------------- Clinic Level of Care Assessment Details Patient Name: Laura Santiago. Date of Service: 09/14/2021 10:00 AM Medical Record Number: [id number] Patient Account Number: [id number] Date of Birth/Sex: 01/30/1946 (75 y.o. F) Treating RN: Dolan Amen Primary Care Merit Gadsby: SYSTEM, PCP Other Clinician: Referring Elaysha Bevard: Randa Evens Treating Johnnette Laux/Extender: Yaakov Guthrie in Treatment: 10 Clinic Level of Care Assessment Items TOOL 1 Quantity Score []  - Use when EandM and Procedure is performed on INITIAL visit 0 ASSESSMENTS - Nursing Assessment / Reassessment []  - General Physical Exam (combine w/ comprehensive assessment (listed just below) when performed on new 0 pt. evals) []  - 0 Comprehensive Assessment (HX, ROS, Risk Assessments, Wounds Hx, etc.) ASSESSMENTS - Wound and Skin Assessment / Reassessment []  - Dermatologic / Skin Assessment (not related to wound area) 0 ASSESSMENTS - Ostomy and/or  Continence Assessment and Care []  - Incontinence Assessment and Management 0 []  - 0 Ostomy Care Assessment and Management (repouching, etc.) PROCESS - Coordination of Care []  - Simple Patient / Family Education for ongoing care 0 []  - 0 Complex (extensive) Patient / Family Education for ongoing care []  - 0 Staff obtains Programmer, systems, Records, Test Results / Process Orders []  - 0 Staff telephones HHA, Nursing Homes / Clarify orders / etc []  - 0 Routine Transfer to another Facility (non-emergent condition) []  - 0 Routine Hospital Admission (non-emergent condition) []  - 0 New Admissions / Biomedical engineer / Ordering NPWT, Apligraf, etc. []  - 0 Emergency Hospital Admission (emergent condition) PROCESS - Special Needs []  - Pediatric / Minor Patient Management 0 []  - 0 Isolation Patient Management []  - 0 Hearing / Language / Visual special needs []  - 0 Assessment of Community assistance (transportation, D/C planning, etc.) []  - 0 Additional assistance / Altered mentation []  - 0 Support Surface(s) Assessment (bed, cushion, seat, etc.) INTERVENTIONS - Miscellaneous []  - External ear exam 0 []  - 0 Patient Transfer (multiple staff / Civil Service fast streamer / Similar devices) []  - 0 Simple Staple / Suture removal (25 or less) []  - 0 Complex Staple / Suture removal (26 or more) []  - 0 Hypo/Hyperglycemic Management (do not check if billed separately) []  - 0 Ankle / Brachial Index (ABI) - do not check if billed separately Has the patient been seen at the hospital within the last three years: Yes Total Score: 0 Level Of Care: ____ Laura Santiago ([id number]) Electronic Signature(s) Signed: 09/15/2021 1:42:44 PM By: Dolan Amen RN Entered By: Dolan Amen on 09/14/2021 10:51:30 Laura Santiago ([id number]) -------------------------------------------------------------------------------- Lower Extremity Assessment Details Patient Name: Laura Santiago. Date of Service:  09/14/2021 10:00 AM Medical Record Number: [id number] Patient Account Number: [id number] Date of Birth/Sex: December 23, 1945 (75 y.o. F) Treating RN: Dolan Amen Primary Care Miroslava Santellan: SYSTEM, PCP Other Clinician: Referring Jasmond River: Randa Evens Treating Fantasha Daniele/Extender: Kalman Shan  Weeks in Treatment: 10 Edema Assessment Assessed: [Left: Yes] [Right: No] Edema: [Left: Ye] [Right: s] Calf Left: Right: Point of Measurement: 29 cm From Medial Instep 36.5 cm Ankle Left: Right: Point of Measurement: 10 cm From Medial Instep 22.5 cm Electronic Signature(s) Signed: 09/15/2021 1:42:44 PM By: Rogers Blocker RN Entered By: Rogers Blocker on 09/14/2021 10:12:58 Laura Santiago ([id number]) -------------------------------------------------------------------------------- Multi Wound Chart Details Patient Name: Laura Santiago. Date of Service: 09/14/2021 10:00 AM Medical Record Number: [id number] Patient Account Number: [id number] Date of Birth/Sex: January 17, 1946 (75 y.o. 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 Comorbid History: Hypertension, Osteoarthritis, Hypertension, Osteoarthritis, Hypertension, Osteoarthritis, Received Chemotherapy, Received Received Chemotherapy, Received Received Chemotherapy, Received Radiation Radiation Radiation Date Acquired: 04/06/2021 08/24/2021 08/24/2021 Weeks of Treatment: 10 3 3  Wound Status: Open  Open Open Clustered Wound: Yes No No Clustered Quantity: 2 N/A N/A Measurements L x W x D (cm) 9.5x9x0.2 0.3x0.4x0.2 0x0x0 Area (cm) : 67.152 0.094 0 Volume (cm) : 13.43 0.019 0 % Reduction in Area: 25.70% 40.10% 100.00% % Reduction in Volume: 25.70% 69.80% 100.00% Classification: Full Thickness Without Exposed Full Thickness Without Exposed Full Thickness Without Exposed Support Structures Support Structures Support Structures Exudate Amount: Large Medium None Present Exudate Type: Serosanguineous Serosanguineous N/A Exudate Color: red, brown red, brown N/A Granulation Amount: Small (1-33%) Large (67-100%) None Present (0%) Granulation Quality: Red Red N/A Necrotic Amount: Large (67-100%) Small (1-33%) None Present (0%) Exposed Structures: Fat Layer (Subcutaneous Tissue): Fat Layer (Subcutaneous Tissue): Fascia: No Yes Yes Fat Layer (Subcutaneous Tissue): Fascia: No Fascia: No No Tendon: No Tendon: No Tendon: No Muscle: No Muscle: No Muscle: No Joint: No Joint: No Joint: No Bone: No Bone: No Bone: No Epithelialization: Large (67-100%) None Large (67-100%) Debridement: Debridement - Excisional N/A N/A Pre-procedure Verification/Time 10:20 N/A N/A Out Taken: Tissue Debrided: Subcutaneous, Slough N/A N/A Level: Skin/Subcutaneous Tissue N/A N/A Debridement Area (sq cm): 85.5 N/A N/A Instrument: Curette N/A N/A Bleeding: Minimum N/A N/A Hemostasis Achieved: Pressure N/A N/A Debridement Treatment Procedure was tolerated well N/A N/A ResponseROSAELENA, Laura Santiago (Laura Santiago) Post Debridement 9.5x9x0.3 N/A N/A Measurements L x W x D (cm) Post Debridement Volume: 20.145 N/A N/A (cm) Procedures Performed: Debridement N/A N/A Treatment Notes Electronic Signature(s) Signed: 09/14/2021 10:35:10 AM By: 11/14/2021 DO Entered By: Geralyn Corwin on 09/14/2021 10:28:35 11/14/2021  (Laura Santiago) -------------------------------------------------------------------------------- Multi-Disciplinary Care Plan Details Patient Name: [id number]. Date of Service: 09/14/2021 10:00 AM Medical Record Number: 11/14/2021 Patient Account Number: [id number] Date of Birth/Sex: 04/01/1946 (75 y.o. F) Treating RN: (75 Primary Care Brennley Curtice: SYSTEM, PCP Other Clinician: Referring Myiesha Edgar: Rogers Blocker Treating Baine Decesare/Extender: Owens Shark in Treatment: 10 Active Inactive Necrotic Tissue Nursing Diagnoses: Impaired tissue integrity related to necrotic/devitalized tissue Knowledge deficit related to management of necrotic/devitalized tissue Goals: Necrotic/devitalized tissue will be minimized in the wound bed Date Initiated: 07/06/2021 Date Inactivated: 07/27/2021 Target Resolution Date: 07/06/2021 Goal Status: Met Patient/caregiver will verbalize understanding of reason and process for debridement of necrotic tissue Date Initiated: 07/06/2021 Target Resolution Date: 07/06/2021 Goal Status: Active Interventions: Assess patient pain level pre-, during and post  procedure and prior to discharge Provide education on necrotic tissue and debridement process Treatment Activities: Apply topical anesthetic as ordered : 07/06/2021 Biologic debridement : 07/06/2021 Enzymatic debridement : 07/06/2021 Excisional debridement : 07/06/2021 Notes: Wound/Skin Impairment Nursing Diagnoses: Impaired tissue integrity Goals: Patient/caregiver will verbalize understanding of skin care regimen Date Initiated: 07/06/2021 Date Inactivated: 07/27/2021 Target Resolution Date: 07/06/2021 Goal Status: Met Ulcer/skin breakdown will have a volume reduction of 30% by week 4 Date Initiated: 07/06/2021 Target Resolution Date: 08/06/2021 Goal Status: Active Ulcer/skin breakdown will have a volume reduction of 50% by week 8 Date Initiated: 07/06/2021 Target Resolution Date: 09/06/2021 Goal  Status: Active Ulcer/skin breakdown will have a volume reduction of 80% by week 12 Date Initiated: 07/06/2021 Target Resolution Date: 10/06/2021 Goal Status: Active Ulcer/skin breakdown will heal within 14 weeks Date Initiated: 07/06/2021 Target Resolution Date: 11/06/2021 Goal Status: Active Interventions: Assess patient/caregiver ability to obtain necessary supplies Assess patient/caregiver ability to perform ulcer/skin care regimen upon admission and as needed Assess ulceration(s) every visit Laura Santiago, Laura Santiago ([id number]) Treatment Activities: Referred to DME Sarahjane Matherly for dressing supplies : 07/06/2021 Skin care regimen initiated : 07/06/2021 Notes: Electronic Signature(s) Signed: 09/15/2021 1:42:44 PM By: Dolan Amen RN Entered By: Dolan Amen on 09/14/2021 10:14:36 Laura Santiago ([id number]) -------------------------------------------------------------------------------- Pain Assessment Details Patient Name: Laura Santiago. Date of Service: 09/14/2021 10:00 AM Medical Record Number: [id number] Patient Account Number: [id number] Date of Birth/Sex: [birth year] (75 y.o. F) Treating RN: Dolan Amen Primary Care Elianne Gubser: SYSTEM, PCP Other Clinician: Referring Cloyd Ragas: Randa Evens Treating Damilola Flamm/Extender: Yaakov Guthrie in Treatment: 10 Active Problems Location of Pain Severity and Description of Pain Patient Has Paino No Site Locations Rate the pain. Current Pain Level: 0 Pain Management and Medication Current Pain Management: Electronic Signature(s) Signed: 09/15/2021 1:42:44 PM By: Dolan Amen RN Entered By: Dolan Amen on 09/14/2021 10:00:00 Laura Santiago ([id number]) -------------------------------------------------------------------------------- Patient/Caregiver Education Details Patient Name: Laura Santiago. Date of Service: 09/14/2021 10:00 AM Medical Record Number: [id number] Patient Account Number: [id number] Date of  Birth/Gender: [birth year] (75 y.o. F) Treating RN: Dolan Amen Primary Care Physician: SYSTEM, PCP Other Clinician: Referring Physician: Randa Evens Treating Physician/Extender: Yaakov Guthrie in Treatment: 10 Education Assessment Education Provided To: Patient Education Topics Provided Wound/Skin Impairment: Methods: Explain/Verbal Responses: State content correctly Electronic Signature(s) Signed: 09/15/2021 1:42:44 PM By: Dolan Amen RN Entered By: Dolan Amen on 09/14/2021 10:51:43 Laura Santiago ([id number]) -------------------------------------------------------------------------------- Wound Assessment Details Patient Name: Laura Santiago. Date of Service: 09/14/2021 10:00 AM Medical Record Number: [id number] Patient Account Number: [id number] Date of Birth/Sex: 09-06-1946 (75 y.o. F) Treating RN: Dolan Amen Primary Care Nayali Talerico: SYSTEM, PCP Other Clinician: Referring Kavari Parrillo: Randa Evens Treating Iann Rodier/Extender: Yaakov Guthrie in Treatment: 10 Wound Status Wound Number: 1 Primary Venous Leg Ulcer Etiology: Wound Location: Left, Medial Lower Leg Wound Status: Open Wounding Event: Gradually Appeared Comorbid Hypertension, Osteoarthritis, Received Chemotherapy, Date Acquired: 04/06/2021 History: Received Radiation Weeks Of Treatment: 10 Clustered Wound: Yes Photos Wound Measurements Length: (cm) 9.5 Width: (cm) 9 Depth: (cm) 0.2 Clustered Quantity: 2 Area: (cm) 67.152 Volume: (cm) 13.43 % Reduction in Area: 25.7% % Reduction in Volume: 25.7% Epithelialization: Large (67-100%) Tunneling: No Undermining: No Wound Description Classification: Full Thickness Without Exposed Support Structu Exudate Amount: Large Exudate Type: Serosanguineous Exudate Color: red, brown res Foul Odor After Cleansing: No Slough/Fibrino Yes Wound Bed Granulation Amount: Small (1-33%) Exposed Structure Granulation Quality: Red Fascia Exposed:  No Necrotic Amount: Large (67-100%) Fat Layer (Subcutaneous Tissue) Exposed: Yes Necrotic Quality: Adherent Slough  Tendon Exposed: No Muscle Exposed: No Joint Exposed: No Bone Exposed: No Electronic Signature(s) Signed: 09/15/2021 1:42:44 PM By: Dolan Amen RN Entered By: Dolan Amen on 09/14/2021 10:10:18 Laura Santiago ([id number]) -------------------------------------------------------------------------------- Wound Assessment Details Patient Name: Laura Santiago. Date of Service: 09/14/2021 10:00 AM Medical Record Number: [id number] Patient Account Number: [id number] Date of Birth/Sex: 1946/06/14 (75 y.o. F) Treating RN: Dolan Amen Primary Care Dandria Griego: SYSTEM, PCP Other Clinician: Referring Khandi Kernes: Randa Evens Treating Jarret Torre/Extender: Yaakov Guthrie in Treatment: 10 Wound Status Wound Number: 2 Primary Neuropathic Ulcer-Non Diabetic Etiology: Wound Location: Right Toe Great Wound Status: Open Wounding Event: Gradually Appeared Comorbid Hypertension, Osteoarthritis, Received Chemotherapy, Date Acquired: 08/24/2021 History: Received Radiation Weeks Of Treatment: 3 Clustered Wound: No Photos Wound Measurements Length: (cm) 0.3 Width: (cm) 0.4 Depth: (cm) 0.2 Area: (cm) 0.094 Volume: (cm) 0.019 % Reduction in Area: 40.1% % Reduction in Volume: 69.8% Epithelialization: None Tunneling: No Undermining: No Wound Description Classification: Full Thickness Without Exposed Support Structu Exudate Amount: Medium Exudate Type: Serosanguineous Exudate Color: red, brown res Foul Odor After Cleansing: No Slough/Fibrino Yes Wound Bed Granulation Amount: Large (67-100%) Exposed Structure Granulation Quality: Red Fascia Exposed: No Necrotic Amount: Small (1-33%) Fat Layer (Subcutaneous Tissue) Exposed: Yes Necrotic Quality: Adherent Slough Tendon Exposed: No Muscle Exposed: No Joint Exposed: No Bone Exposed: No Electronic  Signature(s) Signed: 09/15/2021 1:42:44 PM By: Dolan Amen RN Entered By: Dolan Amen on 09/14/2021 10:08:37 Laura Santiago ([id number]) -------------------------------------------------------------------------------- Wound Assessment Details Patient Name: Laura Santiago. Date of Service: 09/14/2021 10:00 AM Medical Record Number: [id number] Patient Account Number: [id number] Date of Birth/Sex: 09/06/1946 (75 y.o. F) Treating RN: Dolan Amen Primary Care Arnette Driggs: SYSTEM, PCP Other Clinician: Referring Kennetta Pavlovic: Randa Evens Treating Tandy Grawe/Extender: Yaakov Guthrie in Treatment: 10 Wound Status Wound Number: 4 Primary Neuropathic Ulcer-Non Diabetic Etiology: Wound Location: Right Toe Third Wound Status: Open Wounding Event: Footwear Injury Comorbid Hypertension, Osteoarthritis, Received Chemotherapy, Date Acquired: 08/24/2021 History: Received Radiation Weeks Of Treatment: 3 Clustered Wound: No Photos Wound Measurements Length: (cm) 0 Width: (cm) 0 Depth: (cm) 0 Area: (cm) 0 Volume: (cm) 0 % Reduction in Area: 100% % Reduction in Volume: 100% Epithelialization: Large (67-100%) Tunneling: No Undermining: No Wound Description Classification: Full Thickness Without Exposed Support Structures Exudate Amount: None Present Foul Odor After Cleansing: No Slough/Fibrino No Wound Bed Granulation Amount: None Present (0%) Exposed Structure Necrotic Amount: None Present (0%) Fascia Exposed: No Fat Layer (Subcutaneous Tissue) Exposed: No Tendon Exposed: No Muscle Exposed: No Joint Exposed: No Bone Exposed: No Electronic Signature(s) Signed: 09/15/2021 1:42:44 PM By: Dolan Amen RN Entered By: Dolan Amen on 09/14/2021 10:09:03 Laura Santiago ([id number]) -------------------------------------------------------------------------------- [place] Details Patient Name: Laura Santiago. Date of Service: 09/14/2021 10:00 AM Medical Record Number:  [id number] Patient Account Number: [id number] Date of Birth/Sex: 26-Mar-1946 (75 y.o. F) Treating RN: Dolan Amen Primary Care Stetson Pelaez: SYSTEM, PCP Other Clinician: Referring Aizlyn Schifano: Randa Evens Treating Jt Brabec/Extender: Yaakov Guthrie in Treatment: 10 Vital Signs Time Taken: 09:59 Temperature (F): 98.4 Height (in): 66 Pulse (bpm): 76 Weight (lbs): 153 Respiratory Rate (breaths/min): 18 Body Mass Index (BMI): 24.7 Blood Pressure (mmHg): 147/63 Reference Range: 80 - 120 mg / dl Electronic Signature(s) Signed: 09/15/2021 1:42:44 PM By: Dolan Amen RN Entered By: Dolan Amen on 09/14/2021 09:59:53

## 2021-09-15 NOTE — Progress Notes (Signed)
Laura Santiago, Laura Santiago ([id number]) Visit Report for 09/14/2021 Chief Complaint Document Details Patient Name: Laura Santiago, Laura Santiago. Date of Service: 09/14/2021 10:00 AM Medical Record Number: [id number] Patient Account Number: [id number] Date of Birth/Sex: Dec 01, 1946 (75 y.o. F) Treating RN: Dolan Amen Primary Care Provider: SYSTEM, PCP Other Clinician: Referring Provider: Randa Evens Treating Provider/Extender: Yaakov Guthrie in Treatment: 10 Information Obtained from: Patient Chief Complaint Left lower extremity wound Right toe wounds Electronic Signature(s) Signed: 09/14/2021 10:35:10 AM By: Kalman Shan DO Entered By: Kalman Shan on 09/14/2021 10:28:48 Laura Santiago ([id number]) -------------------------------------------------------------------------------- Debridement Details Patient Name: Laura Santiago. Date of Service: 09/14/2021 10:00 AM Medical Record Number: [id number] Patient Account Number: [id number] Date of Birth/Sex: 03-22-1946 (75 y.o. F) Treating RN: Dolan Amen Primary Care Provider: SYSTEM, PCP Other Clinician: Referring Provider: Randa Evens Treating Provider/Extender: Yaakov Guthrie in Treatment: 10 Debridement Performed for Wound #1 Left,Medial Lower Leg Assessment: Performed By: Physician Kalman Shan, MD Debridement Type: Debridement Severity of Tissue Pre Debridement: Fat layer exposed Level of Consciousness (Pre- Awake and Alert procedure): Pre-procedure Verification/Time Out Yes - 10:20 Taken: Start Time: 10:20 Total Area Debrided (L x W): 9.5 (cm) x 9 (cm) = 85.5 (cm) Tissue and other material Viable, Non-Viable, Slough, Subcutaneous, Slough debrided: Level: Skin/Subcutaneous Tissue Debridement Description: Excisional Instrument: Curette Bleeding: Minimum Hemostasis Achieved: Pressure Response to Treatment: Procedure was tolerated well Level of Consciousness (Post- Awake and Alert procedure): Post  Debridement Measurements of Total Wound Length: (cm) 9.5 Width: (cm) 9 Depth: (cm) 0.3 Volume: (cm) 20.145 Character of Wound/Ulcer Post Debridement: Stable Severity of Tissue Post Debridement: Fat layer exposed Post Procedure Diagnosis Same as Pre-procedure Electronic Signature(s) Signed: 09/14/2021 10:35:10 AM By: Kalman Shan DO Signed: 09/15/2021 1:42:44 PM By: Dolan Amen RN Entered By: Dolan Amen on 09/14/2021 10:23:17 Laura Santiago ([id number]) -------------------------------------------------------------------------------- HPI Details Patient Name: Laura Santiago. Date of Service: 09/14/2021 10:00 AM Medical Record Number: [id number] Patient Account Number: [id number] Date of Birth/Sex: 03-Mar-1946 (75 y.o. F) Treating RN: Dolan Amen Primary Care Provider: SYSTEM, PCP Other Clinician: Referring Provider: Randa Evens Treating Provider/Extender: Yaakov Guthrie in Treatment: 10 History of Present Illness HPI Description: Admission 7/27 [name] is a 75 year old female with a past medical history of ADHD, metastatic breast cancer, stage IV chronic kidney disease, history of DVT on Xarelto and chronic venous insufficiency that presents to the clinic for a chronic left lower extremity wound. She recently moved to [facility] 4 days ago. She was being followed by wound care center in [name]. She reports a 10-year history of wounds to her left lower extremity that eventually do heal with debridement and compression therapy. She states that the current wound reopened 4 months ago and she is using Vaseline and Coban. She denies signs of infection. 8/3; patient presents for 1 week follow-up. She reports no issues or complaints today. She states she had vascular studies done in the last week. She denies signs of infection. She brought her little service dog with her today. 8/17; patient presents for follow-up. She has missed her last  clinic appointment. She states she took the wrap off and attempted to rewrap her leg. She is having difficulty with transportation. She has her service dog with her today. Overall she feels well and reports improvement in wound healing. She denies signs of infection. She reports owning an old Velcro wrap compression and has this at her living facility 9/14; patient presents for follow-up. Patient states that over the past 2 to 3  weeks she developed toe wounds to her right foot. She attributes this to tight fitting shoes. She subsequently developed cellulitis in the right leg and has been treated by doxycycline by her oncologist. She reports improvement in symptoms however continues to have some redness and swelling to this leg. To the left lower extremity patient has been having her wraps changed with home health twice weekly. She states that the [facility] is not helping control the drainage. Other than that she has no issues or complaints today. She denies signs of infection to the left lower extremity. 9/21; patient presents for follow-up. She reports seeing infectious disease for her cellulitis. She reports no further management. She has home health that changes the wraps twice weekly. She has no issues or complaints today. She denies signs of infection. 10/5; patient presents for follow-up. She has no issues or complaints today. She denies signs of infection. She states that the right great toe has not been dressed by home health. Electronic Signature(s) Signed: 09/14/2021 10:35:10 AM By: Kalman Shan DO Entered By: Kalman Shan on 09/14/2021 10:29:17 Laura Santiago ([id number]) -------------------------------------------------------------------------------- Physical Exam Details Patient Name: Laura Santiago, Laura Santiago. Date of Service: 09/14/2021 10:00 AM Medical Record Number: [id number] Patient Account Number: [id number] Date of Birth/Sex: May 27, 1946 (75 y.o. F) Treating RN: Dolan Amen Primary Care Provider: SYSTEM, PCP Other Clinician: Referring Provider: Randa Evens Treating Provider/Extender: Yaakov Guthrie in Treatment: 10 Constitutional . Cardiovascular . Psychiatric . Notes Left lower extremity: Open wounds to the medial aspect with granulation tissue, slough and fibrinous tissue. Right lower extremity: the second and third toe scabs are off and epithelialization to previous wound site. To the right great toe there is an open wound with granulation tissue and circumferential undermining. No signs of infection seen on exam. Electronic Signature(s) Signed: 09/14/2021 10:35:10 AM By: Kalman Shan DO Entered By: Kalman Shan on 09/14/2021 10:31:24 Laura Santiago ([id number]) -------------------------------------------------------------------------------- Physician Orders Details Patient Name: Laura Santiago. Date of Service: 09/14/2021 10:00 AM Medical Record Number: [id number] Patient Account Number: [id number] Date of Birth/Sex: 1946/04/14 (75 y.o. F) Treating RN: Dolan Amen Primary Care Provider: SYSTEM, PCP Other Clinician: Referring Provider: Randa Evens Treating Provider/Extender: Yaakov Guthrie in Treatment: 10 Verbal / Phone Orders: No Diagnosis Coding Follow-up Appointments o Return Appointment in 1 week. [place] for wound care. May utilize formulary equivalent dressing for wound treatment orders unless otherwise specified. Home Health Nurse may visit PRN to address patientos wound care needs. o Scheduled days for dressing changes to be completed; exception, patient has scheduled wound care visit that day. o **Please direct any NON-WOUND related issues/requests for orders to patient's Primary Care Physician. **If current dressing causes regression in wound condition, may D/C ordered dressing product/s and apply Normal Saline Moist Dressing  daily until next [place] or Other MD appointment. **Notify Wound Healing Center of regression in wound condition at [phone]. Bathing/ Shower/ Hygiene o May shower with wound dressing protected with water repellent cover or cast protector. o No tub bath. Medications-Please add to medication list. o P.O. Antibiotics - continue keflex Wound Treatment Wound #1 - Lower Leg Wound Laterality: Left, Medial Cleanser: Soap and Water 2 x Per Week/30 Days Discharge Instructions: Gently cleanse wound with antibacterial soap, rinse and pat dry prior to dressing wounds Topical: Gentamicin 2 x Per Week/30 Days Discharge Instructions: Apply as directed by provider. Topical: Santyl Collagenase Ointment, 30 (gm), tube 2 x Per  Week/30 Days Discharge Instructions: Apply nickel thick to wound bed only Primary Dressing: Hydrofera Blue Ready Transfer Foam, 2.5x2.5 (in/in) 2 x Per Week/30 Days Discharge Instructions: Apply Hydrofera Blue Ready to wound bed as directed Secondary Dressing: ABD Pad 5x9 (in/in) (Generic) 2 x Per Week/30 Days Discharge Instructions: Cover with ABD pad Compression Wrap: Profore Lite LF 3 Multilayer Compression Bandaging System 2 x Per Week/30 Days Discharge Instructions: Apply 3 multi-layer wrap as prescribed. Wound #2 - Toe Great Wound Laterality: Right Cleanser: Wound Cleanser 1 x Per Day/30 Days Discharge Instructions: Wash your hands with soap and water. Remove old dressing, discard into plastic bag and place into trash. Cleanse the wound with Wound Cleanser prior to applying a clean dressing using gauze sponges, not tissues or cotton balls. Do not scrub or use excessive force. Pat dry using gauze sponges, not tissue or cotton balls. Primary Dressing: Silvercel Small 2x2 (in/in) 1 x Per Day/30 Days Discharge Instructions: Apply Silvercel Small 2x2 (in/in) as instructed Secondary Dressing: Gauze 1 x Per Day/30 Days Discharge Instructions: Cover with dry  gauze Secured With: 36M Medipore H Soft Cloth Surgical Tape, 2x2 (in/yd) 1 x Per Day/30 Days Discharge Instructions: Secure dressing Laura Santiago, Laura Santiago ([id number]) Patient Medications Allergies: Celebrex Notifications Medication Indication Start End Santyl 09/14/2021 DOSE 1 - topical 250 unit/gram ointment - 1 application prior to wrap change Electronic Signature(s) Signed: 09/14/2021 10:34:09 AM By: Kalman Shan DO Entered By: Kalman Shan on 09/14/2021 10:34:09 Laura Santiago ([id number]) -------------------------------------------------------------------------------- Problem List Details Patient Name: Laura Santiago. Date of Service: 09/14/2021 10:00 AM Medical Record Number: [id number] Patient Account Number: [id number] Date of Birth/Sex: [birth year] (75 y.o. F) Treating RN: Dolan Amen Primary Care Provider: SYSTEM, PCP Other Clinician: Referring Provider: Randa Evens Treating Provider/Extender: Yaakov Guthrie in Treatment: 10 Active Problems ICD-10 Encounter Code Description Active Date MDM Diagnosis S81.802D Unspecified open wound, left lower leg, subsequent encounter 07/13/2021 No Yes S91.101D Unspecified open wound of right great toe without damage to nail, 08/31/2021 No Yes subsequent encounter I87.2 Venous insufficiency (chronic) (peripheral) 07/06/2021 No Yes 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 07/06/2021 No Yes I10 Essential (primary) hypertension 07/06/2021 No Yes Z79.01 Long term (current) use of anticoagulants 07/06/2021 No Yes Inactive Problems ICD-10 Code Description Active Date Inactive Date S81.802A Unspecified open wound, left lower leg, initial encounter 07/06/2021 07/06/2021 S91.101A Unspecified open wound of right great toe without damage to nail, initial 08/24/2021 08/24/2021 encounter S91.104A Unspecified open wound of right lesser toe(s) without damage to nail, initial 08/24/2021 08/24/2021 encounter Resolved  Problems ICD-10 Code Description Active Date Resolved Date S91.104D Unspecified open wound of right lesser toe(s) without damage to nail, 08/31/2021 08/31/2021 subsequent encounter Laura Santiago, Laura Santiago ([id number]) Electronic Signature(s) Signed: 09/14/2021 10:35:10 AM By: Kalman Shan DO Entered By: Kalman Shan on 09/14/2021 10:28:28 Laura Santiago ([id number]) -------------------------------------------------------------------------------- Progress Note Details Patient Name: Laura Santiago. Date of Service: 09/14/2021 10:00 AM Medical Record Number: [id number] Patient Account Number: [id number] Date of Birth/Sex: Sep 14, 1946 (75 y.o. F) Treating RN: Dolan Amen Primary Care Provider: SYSTEM, PCP Other Clinician: Referring Provider: Randa Evens Treating Provider/Extender: Yaakov Guthrie in Treatment: 10 Subjective Chief Complaint Information obtained from Patient Left lower extremity wound Right toe wounds History of Present Illness (HPI) Admission 7/27 Ms. Shyrl Obi is a 75 year old female with a past medical history of ADHD, metastatic breast cancer, stage IV chronic kidney disease, history of DVT on Xarelto and chronic venous insufficiency that presents to the clinic for a chronic left lower extremity  wound. She recently moved to [facility] 4 days ago. She was being followed by wound care center in [name]. She reports a 10-year history of wounds to her left lower extremity that eventually do heal with debridement and compression therapy. She states that the current wound reopened 4 months ago and she is using Vaseline and Coban. She denies signs of infection. 8/3; patient presents for 1 week follow-up. She reports no issues or complaints today. She states she had vascular studies done in the last week. She denies signs of infection. She brought her little service dog with her today. 8/17; patient presents for follow-up. She has missed her last  clinic appointment. She states she took the wrap off and attempted to rewrap her leg. She is having difficulty with transportation. She has her service dog with her today. Overall she feels well and reports improvement in wound healing. She denies signs of infection. She reports owning an old Velcro wrap compression and has this at her living facility 9/14; patient presents for follow-up. Patient states that over the past 2 to 3 weeks she developed toe wounds to her right foot. She attributes this to tight fitting shoes. She subsequently developed cellulitis in the right leg and has been treated by doxycycline by her oncologist. She reports improvement in symptoms however continues to have some redness and swelling to this leg. To the left lower extremity patient has been having her wraps changed with home health twice weekly. She states that the [facility] is not helping control the drainage. Other than that she has no issues or complaints today. She denies signs of infection to the left lower extremity. 9/21; patient presents for follow-up. She reports seeing infectious disease for her cellulitis. She reports no further management. She has home health that changes the wraps twice weekly. She has no issues or complaints today. She denies signs of infection. 10/5; patient presents for follow-up. She has no issues or complaints today. She denies signs of infection. She states that the right great toe has not been dressed by home health. Patient History Information obtained from Patient. Social History Never smoker. Medical History Eyes Denies history of Cataracts, Glaucoma, Optic Neuritis Ear/Nose/Mouth/Throat Denies history of Chronic sinus problems/congestion, Middle ear problems Hematologic/Lymphatic Denies history of Anemia, Hemophilia, Human Immunodeficiency Virus, Lymphedema, Sickle Cell Disease Respiratory Denies history of Aspiration, Asthma, Chronic Obstructive Pulmonary Disease  (COPD), Pneumothorax, Sleep Apnea, Tuberculosis Cardiovascular Patient has history of Hypertension Denies history of Angina, Arrhythmia, Congestive Heart Failure, Coronary Artery Disease, Deep Vein Thrombosis, Hypotension, Myocardial Infarction, Peripheral Arterial Disease, Peripheral Venous Disease, Phlebitis, Vasculitis Gastrointestinal Denies history of Cirrhosis , Colitis, Crohn s, Hepatitis A, Hepatitis B, Hepatitis C Endocrine Denies history of Type I Diabetes, Type II Diabetes Genitourinary Denies history of End Stage Renal Disease Immunological Denies history of Lupus Erythematosus, Raynaud s, Scleroderma Integumentary (Skin) Denies history of History of Burn, History of pressure wounds Musculoskeletal Patient has history of Osteoarthritis Denies history of Gout, Rheumatoid Arthritis, Osteomyelitis Oncologic Laura Santiago, Laura Santiago ([id number]) Patient has history of Received Chemotherapy, Received Radiation Medical And Surgical History Notes Oncologic breast cancer Objective Constitutional Vitals Time Taken: 9:59 AM, Height: 66 in, Weight: 153 lbs, BMI: 24.7, Temperature: 98.4 F, Pulse: 76 bpm, Respiratory Rate: 18 breaths/min, Blood Pressure: 147/63 mmHg. General Notes: Left lower extremity: Open wounds to the medial aspect with granulation tissue, slough and fibrinous tissue. Right lower extremity: the second and third toe scabs are off and epithelialization to previous wound site. To the  right great toe there is an open wound with granulation tissue and circumferential undermining. No signs of infection seen on exam. Integumentary (Hair, Skin) Wound #1 status is Open. Original cause of wound was Gradually Appeared. The date acquired was: 04/06/2021. The wound has been in treatment 10 weeks. The wound is located on the Left,Medial Lower Leg. The wound measures 9.5cm length x 9cm width x 0.2cm depth; 67.152cm^2 area and 13.43cm^3 volume. There is Fat Layer (Subcutaneous Tissue)  exposed. There is no tunneling or undermining noted. There is a large amount of serosanguineous drainage noted. There is small (1-33%) red granulation within the wound bed. There is a large (67-100%) amount of necrotic tissue within the wound bed including Adherent Slough. Wound #2 status is Open. Original cause of wound was Gradually Appeared. The date acquired was: 08/24/2021. The wound has been in treatment 3 weeks. The wound is located on the Right Toe Great. The wound measures 0.3cm length x 0.4cm width x 0.2cm depth; 0.094cm^2 area and 0.019cm^3 volume. There is Fat Layer (Subcutaneous Tissue) exposed. There is no tunneling or undermining noted. There is a medium amount of serosanguineous drainage noted. There is large (67-100%) red granulation within the wound bed. There is a small (1-33%) amount of necrotic tissue within the wound bed including Adherent Slough. Wound #4 status is Open. Original cause of wound was Footwear Injury. The date acquired was: 08/24/2021. The wound has been in treatment 3 weeks. The wound is located on the Right Toe Third. The wound measures 0cm length x 0cm width x 0cm depth; 0cm^2 area and 0cm^3 volume. There is no tunneling or undermining noted. There is a none present amount of drainage noted. There is no granulation within the wound bed. There is no necrotic tissue within the wound bed. Assessment Active Problems ICD-10 Unspecified open wound, left lower leg, subsequent encounter Unspecified open wound of right great toe without damage to nail, subsequent encounter Venous insufficiency (chronic) (peripheral) Secondary malignant neoplasm of breast Essential (primary) hypertension Long term (current) use of anticoagulants Patient's left lower extremity wound has increased slough and fibrinous tissue tightly adhered. I tried to debride nonviable tissue. The area appears dry. I recommended going back to [facility] and adding Santyl. To the right great toe  wound I recommended continuing with silver alginate. No obvious signs of infection on exam. 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 There was a Excisional Skin/Subcutaneous Tissue Debridement with a total area of 85.5 sq cm performed by Kalman Shan, MD. With Laura Santiago ([id number]) the following instrument(s): Curette to remove Viable and Non-Viable tissue/material. Material removed includes Subcutaneous Tissue and Slough and. A time out was conducted at 10:20, prior to the start of the procedure. A Minimum amount of bleeding was controlled with Pressure. The procedure was tolerated well. Post Debridement Measurements: 9.5cm length x 9cm width x 0.3cm depth; 20.145cm^3 volume. Character of Wound/Ulcer Post Debridement is stable. Severity of Tissue Post Debridement is: Fat layer exposed. Post procedure Diagnosis Wound #1: Same as Pre-Procedure Plan Follow-up Appointments: Return Appointment in 1 week. Home Health: [place]: - [place] for wound care. May utilize formulary equivalent dressing for wound treatment orders unless otherwise specified. Home Health Nurse may visit PRN to address patient s wound care needs. Scheduled days for dressing changes to be completed; exception, patient has scheduled  wound care visit that day. **Please direct any NON-WOUND related issues/requests for orders to patient's Primary Care Physician. **If current dressing causes regression in wound condition, may D/C ordered dressing product/s and apply Normal Saline Moist Dressing daily until next [place] or Other MD appointment. **Notify Wound Healing Center of regression in wound condition at [phone]. Bathing/ Shower/ Hygiene: May shower with wound dressing protected with water repellent cover or cast  protector. No tub bath. Medications-Please add to medication list.: P.O. Antibiotics - continue keflex The following medication(s) was prescribed: Santyl topical 250 unit/gram ointment 1 1 application prior to wrap change starting 09/14/2021 WOUND #1: - Lower Leg Wound Laterality: Left, Medial Cleanser: Soap and Water 2 x Per Week/30 Days Discharge Instructions: Gently cleanse wound with antibacterial soap, rinse and pat dry prior to dressing wounds Topical: Gentamicin 2 x Per Week/30 Days Discharge Instructions: Apply as directed by provider. Topical: Santyl Collagenase Ointment, 30 (gm), tube 2 x Per Week/30 Days Discharge Instructions: Apply nickel thick to wound bed only Primary Dressing: Hydrofera Blue Ready Transfer Foam, 2.5x2.5 (in/in) 2 x Per Week/30 Days Discharge Instructions: Apply Hydrofera Blue Ready to wound bed as directed Secondary Dressing: ABD Pad 5x9 (in/in) (Generic) 2 x Per Week/30 Days Discharge Instructions: Cover with ABD pad Compression Wrap: Profore Lite LF 3 Multilayer Compression Bandaging System 2 x Per Week/30 Days Discharge Instructions: Apply 3 multi-layer wrap as prescribed. WOUND #2: - Toe Great Wound Laterality: Right Cleanser: Wound Cleanser 1 x Per Day/30 Days Discharge Instructions: Wash your hands with soap and water. Remove old dressing, discard into plastic bag and place into trash. Cleanse the wound with Wound Cleanser prior to applying a clean dressing using gauze sponges, not tissues or cotton balls. Do not scrub or use excessive force. Pat dry using gauze sponges, not tissue or cotton balls. Primary Dressing: Silvercel Small 2x2 (in/in) 1 x Per Day/30 Days Discharge Instructions: Apply Silvercel Small 2x2 (in/in) as instructed Secondary Dressing: Gauze 1 x Per Day/30 Days Discharge Instructions: Cover with dry gauze Secured With: 14M Medipore H Soft Cloth Surgical Tape, 2x2 (in/yd) 1 x Per Day/30 Days Discharge Instructions: Secure  dressing 1. Hydrofera Blue, gentamicin, Santyl under compression 2. Follow-up in 1 week 3. In office sharp debridement Electronic Signature(s) Signed: 09/14/2021 10:35:10 AM By: Kalman Shan DO Entered By: Kalman Shan on 09/14/2021 10:34:39 Laura Santiago ([id number]) -------------------------------------------------------------------------------- ROS/PFSH Details Patient Name: Laura Santiago. Date of Service: 09/14/2021 10:00 AM Medical Record Number: [id number] Patient Account Number: [id number] Date of Birth/Sex: 10-30-1946 (75 y.o. F) Treating RN: Dolan Amen Primary Care Provider: SYSTEM, PCP Other Clinician: Referring Provider: Randa Evens Treating Provider/Extender: Yaakov Guthrie in Treatment: 10 Information Obtained From Patient Eyes Medical History: Negative for: Cataracts; Glaucoma; Optic Neuritis Ear/Nose/Mouth/Throat Medical History: Negative for: Chronic sinus problems/congestion; Middle ear problems Hematologic/Lymphatic Medical History: Negative for: Anemia; Hemophilia; Human Immunodeficiency Virus; Lymphedema; Sickle Cell Disease Respiratory Medical History: Negative for: Aspiration; Asthma; Chronic Obstructive Pulmonary Disease (COPD); Pneumothorax; Sleep Apnea; Tuberculosis Cardiovascular Medical History: Positive for: Hypertension Negative for: Angina; Arrhythmia; Congestive Heart Failure; Coronary Artery Disease; Deep Vein Thrombosis; Hypotension; Myocardial Infarction; Peripheral Arterial Disease; Peripheral Venous Disease; Phlebitis; Vasculitis Gastrointestinal Medical History: Negative for: Cirrhosis ; Colitis; Crohnos; Hepatitis A; Hepatitis B; Hepatitis C Endocrine Medical History: Negative for: Type I Diabetes; Type II Diabetes Genitourinary Medical History: Negative for: End Stage Renal Disease Immunological Medical History: Negative for: Lupus Erythematosus; Raynaudos; Scleroderma Integumentary (Skin) Medical  History: Negative for: History of Burn; History of  pressure wounds Musculoskeletal Medical History: Positive for: Osteoarthritis Laura Santiago, Laura Santiago ([id number]) Negative for: Gout; Rheumatoid Arthritis; Osteomyelitis Oncologic Medical History: Positive for: Received Chemotherapy; Received Radiation Past Medical History Notes: breast cancer Immunizations Pneumococcal Vaccine: Received Pneumococcal Vaccination: No Implantable Devices None Family and Social History Never smoker Electronic Signature(s) Signed: 09/14/2021 10:35:10 AM By: Kalman Shan DO Signed: 09/15/2021 1:42:44 PM By: Dolan Amen RN Entered By: Kalman Shan on 09/14/2021 10:29:28 Laura Santiago ([id number]) -------------------------------------------------------------------------------- [facility] Details Patient Name: Laura Santiago. Date of Service: 09/14/2021 Medical Record Number: [id number] Patient Account Number: [id number] Date of Birth/Sex: 12-18-1945 (75 y.o. F) Treating RN: Dolan Amen Primary Care Provider: SYSTEM, PCP Other Clinician: Referring Provider: Randa Evens Treating Provider/Extender: Yaakov Guthrie in Treatment: 10 Diagnosis Coding ICD-10 Codes Code Description S81.802D Unspecified open wound, left lower leg, subsequent encounter S91.101D Unspecified open wound of right great toe without damage to nail, subsequent encounter I87.2 Venous insufficiency (chronic) (peripheral) 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 I10 Essential (primary) hypertension Z79.01 Long term (current) use of anticoagulants Facility Procedures CPT4 Code: 27782423 Description: 53614 - DEB SUBQ TISSUE 20 SQ CM/< Modifier: Quantity: 1 CPT4 Code: Description: ICD-10 Diagnosis Description S81.802D Unspecified open wound, left lower leg, subsequent encounter Modifier: Quantity: CPT4 Code: 43154008 Description: 67619 - DEB SUBQ TISS EA ADDL 20CM Modifier: Quantity: 4 CPT4  Code: Description: ICD-10 Diagnosis Description S81.802D Unspecified open wound, left lower leg, subsequent encounter Modifier: Quantity: Physician Procedures CPT4 Code: 5093267 Description: 12458 - WC PHYS SUBQ TISS 20 SQ CM Modifier: Quantity: 1 CPT4 Code: Description: ICD-10 Diagnosis Description S81.802D Unspecified open wound, left lower leg, subsequent encounter Modifier: Quantity: CPT4 Code: [id number] Description: 25053 - WC PHYS SUBQ TISS EA ADDL 20 CM Modifier: Quantity: 4 CPT4 Code: Description: ICD-10 Diagnosis Description S81.802D Unspecified open wound, left lower leg, subsequent encounter Modifier: Quantity: Electronic Signature(s) Signed: 09/14/2021 10:35:10 AM By: Kalman Shan DO Entered By: Kalman Shan on 09/14/2021 10:34:50

## 2021-09-16 ENCOUNTER — Inpatient Hospital Stay (HOSPITAL_BASED_OUTPATIENT_CLINIC_OR_DEPARTMENT_OTHER): Payer: Medicare Other | Admitting: Oncology

## 2021-09-16 ENCOUNTER — Encounter: Payer: Self-pay | Admitting: Oncology

## 2021-09-16 ENCOUNTER — Inpatient Hospital Stay: Payer: Medicare Other

## 2021-09-16 ENCOUNTER — Inpatient Hospital Stay: Payer: Medicare Other | Attending: Oncology

## 2021-09-16 VITALS — BP 140/71 | HR 78 | Temp 98.9°F | Resp 18 | Wt 158.6 lb

## 2021-09-16 DIAGNOSIS — C7951 Secondary malignant neoplasm of bone: Secondary | ICD-10-CM | POA: Insufficient documentation

## 2021-09-16 DIAGNOSIS — Z170 Estrogen receptor positive status [ER+]: Secondary | ICD-10-CM | POA: Diagnosis not present

## 2021-09-16 DIAGNOSIS — F419 Anxiety disorder, unspecified: Secondary | ICD-10-CM | POA: Diagnosis not present

## 2021-09-16 DIAGNOSIS — R209 Unspecified disturbances of skin sensation: Secondary | ICD-10-CM | POA: Insufficient documentation

## 2021-09-16 DIAGNOSIS — Z5112 Encounter for antineoplastic immunotherapy: Secondary | ICD-10-CM | POA: Insufficient documentation

## 2021-09-16 DIAGNOSIS — C50919 Malignant neoplasm of unspecified site of unspecified female breast: Secondary | ICD-10-CM

## 2021-09-16 DIAGNOSIS — R443 Hallucinations, unspecified: Secondary | ICD-10-CM | POA: Diagnosis not present

## 2021-09-16 DIAGNOSIS — Z5111 Encounter for antineoplastic chemotherapy: Secondary | ICD-10-CM | POA: Insufficient documentation

## 2021-09-16 LAB — CBC WITH DIFFERENTIAL/PLATELET
Abs Immature Granulocytes: 0.07 10*3/uL (ref 0.00–0.07)
Basophils Absolute: 0 10*3/uL (ref 0.0–0.1)
Basophils Relative: 1 %
Eosinophils Absolute: 0.2 10*3/uL (ref 0.0–0.5)
Eosinophils Relative: 3 %
HCT: 29.9 % — ABNORMAL LOW (ref 36.0–46.0)
Hemoglobin: 9.1 g/dL — ABNORMAL LOW (ref 12.0–15.0)
Immature Granulocytes: 1 %
Lymphocytes Relative: 12 %
Lymphs Abs: 0.7 10*3/uL (ref 0.7–4.0)
MCH: 29.2 pg (ref 26.0–34.0)
MCHC: 30.4 g/dL (ref 30.0–36.0)
MCV: 95.8 fL (ref 80.0–100.0)
Monocytes Absolute: 0.4 10*3/uL (ref 0.1–1.0)
Monocytes Relative: 6 %
Neutro Abs: 4.7 10*3/uL (ref 1.7–7.7)
Neutrophils Relative %: 77 %
Platelets: 275 10*3/uL (ref 150–400)
RBC: 3.12 MIL/uL — ABNORMAL LOW (ref 3.87–5.11)
RDW: 15.9 % — ABNORMAL HIGH (ref 11.5–15.5)
WBC: 6 10*3/uL (ref 4.0–10.5)
nRBC: 0 % (ref 0.0–0.2)

## 2021-09-16 LAB — COMPREHENSIVE METABOLIC PANEL
ALT: 14 U/L (ref 0–44)
AST: 20 U/L (ref 15–41)
Albumin: 3.6 g/dL (ref 3.5–5.0)
Alkaline Phosphatase: 67 U/L (ref 38–126)
Anion gap: 6 (ref 5–15)
BUN: 20 mg/dL (ref 8–23)
CO2: 25 mmol/L (ref 22–32)
Calcium: 8.4 mg/dL — ABNORMAL LOW (ref 8.9–10.3)
Chloride: 107 mmol/L (ref 98–111)
Creatinine, Ser: 1.37 mg/dL — ABNORMAL HIGH (ref 0.44–1.00)
GFR, Estimated: 40 mL/min — ABNORMAL LOW (ref >60)
Glucose, Bld: 103 mg/dL — ABNORMAL HIGH (ref 70–99)
Potassium: 4 mmol/L (ref 3.5–5.1)
Sodium: 138 mmol/L (ref 135–145)
Total Bilirubin: 0.4 mg/dL (ref 0.3–1.2)
Total Protein: 6.8 g/dL (ref 6.5–8.1)

## 2021-09-16 MED ORDER — FAMOTIDINE 20 MG IN NS 100 ML IVPB
20.0000 mg | Freq: Once | INTRAVENOUS | Status: AC
  Administered 2021-09-16: 20 mg via INTRAVENOUS
  Filled 2021-09-16: qty 20

## 2021-09-16 MED ORDER — MIRTAZAPINE 15 MG PO TABS: 15.0000 mg | ORAL_TABLET | Freq: Every day | ORAL | 0 refills | Status: DC

## 2021-09-16 MED ORDER — HEPARIN SOD (PORK) LOCK FLUSH 100 UNIT/ML IV SOLN
INTRAVENOUS | Status: AC
  Administered 2021-09-16: 500 [IU]
  Filled 2021-09-16: qty 5

## 2021-09-16 MED ORDER — SODIUM CHLORIDE 0.9 % IV SOLN
Freq: Once | INTRAVENOUS | Status: AC
  Filled 2021-09-16: qty 250

## 2021-09-16 MED ORDER — HEPARIN SOD (PORK) LOCK FLUSH 100 UNIT/ML IV SOLN
500.0000 [IU] | Freq: Once | INTRAVENOUS | Status: AC | PRN
  Filled 2021-09-16: qty 5

## 2021-09-16 MED ORDER — SODIUM CHLORIDE 0.9 % IV SOLN
65.0000 mg/m2 | Freq: Once | INTRAVENOUS | Status: AC
  Administered 2021-09-16: 114 mg via INTRAVENOUS
  Filled 2021-09-16: qty 19

## 2021-09-16 MED ORDER — DIPHENHYDRAMINE HCL 50 MG/ML IJ SOLN
50.0000 mg | Freq: Once | INTRAMUSCULAR | Status: AC
  Administered 2021-09-16: 50 mg via INTRAVENOUS
  Filled 2021-09-16: qty 1

## 2021-09-16 MED ORDER — SODIUM CHLORIDE 0.9 % IV SOLN
10.0000 mg | Freq: Once | INTRAVENOUS | Status: AC
  Administered 2021-09-16: 10 mg via INTRAVENOUS
  Filled 2021-09-16: qty 10

## 2021-09-16 NOTE — Progress Notes (Signed)
Patient scheduled to be seen by Rulon Abide, NP due to a call in from patient's daughter regarding possible psychosis episode last night. Patient reports nasal and eye drainage. Patient states "When this starts to move, *patient pointing to forehead*, it goes down into my eyes". "Feels like fungus because it goes into my eyes and I can't see. When I put alcohol and fungus cream on it, it stops. It feels spongy". 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 Patient then begins to complain about BLE. She states that they have white spots all over and that "the spots come off". Patient reports these symptoms "have been happening since forever." Rulon Abide, NP made aware. Per Rulon Abide, NP, okay to proceed with treatment.

## 2021-09-16 NOTE — Patient Instructions (Signed)
[facility] [place] [facility]  Discharge Instructions: Thank you for choosing [facility] to provide your oncology and hematology care.  If you have a lab appointment with the Cancer Center, please go directly to the Cancer Center and check in at the registration area.  Wear comfortable clothing and clothing appropriate for easy access to any Portacath or PICC line.   We strive to give you quality time with your provider. You may need to reschedule your appointment if you arrive late (15 or more minutes).  Arriving late affects you and other patients whose appointments are after yours.  Also, if you miss three or more appointments without notifying the office, you may be dismissed from the clinic at the provider's discretion.      For prescription refill requests, have your pharmacy contact our office and allow 72 hours for refills to be completed.    Today you received the following chemotherapy and/or immunotherapy agents Taxol        To help prevent nausea and vomiting after your treatment, we encourage you to take your nausea medication as directed.  BELOW ARE SYMPTOMS THAT SHOULD BE REPORTED IMMEDIATELY: *FEVER GREATER THAN 100.4 F (38 C) OR HIGHER *CHILLS OR SWEATING *NAUSEA AND VOMITING THAT IS NOT CONTROLLED WITH YOUR NAUSEA MEDICATION *UNUSUAL SHORTNESS OF BREATH *UNUSUAL BRUISING OR BLEEDING *URINARY PROBLEMS (pain or burning when urinating, or frequent urination) *BOWEL PROBLEMS (unusual diarrhea, constipation, pain near the anus) TENDERNESS IN MOUTH AND THROAT WITH OR WITHOUT PRESENCE OF ULCERS (sore throat, sores in mouth, or a toothache) UNUSUAL RASH, SWELLING OR PAIN  UNUSUAL VAGINAL DISCHARGE OR ITCHING   Items with * indicate a potential emergency and should be followed up as soon as possible or go to the Emergency Department if any problems should occur.  Please show the CHEMOTHERAPY ALERT CARD or IMMUNOTHERAPY ALERT CARD at check-in to  the Emergency Department and triage nurse.  Should you have questions after your visit or need to cancel or reschedule your appointment, please contact [facility]  [phone] and follow the prompts.  Office hours are 8:00 a.m. to 4:30 p.m. Monday - Friday. Please note that voicemails left after 4:00 p.m. may not be returned until the following business day.  We are closed weekends and major holidays. You have access to a nurse at all times for urgent questions. Please call the main number to the clinic [phone] and follow the prompts.  For any non-urgent questions, you may also contact your provider using MyChart. We now offer e-Visits for anyone 18 and older to request care online for non-urgent symptoms. For details visit [email or web address].   Also download the MyChart app! Go to the app store, search "MyChart", open the app, select [facility], and log in with your MyChart username and password.  Due to Covid, a mask is required upon entering the hospital/clinic. If you do not have a mask, one will be given to you upon arrival. For doctor visits, patients may have 1 support person aged 18 or older with them. For treatment visits, patients cannot have anyone with them due to current Covid guidelines and our immunocompromised population.  

## 2021-09-16 NOTE — Progress Notes (Signed)
Symptom Management Consult note [facility]  Telephone:([phone] [phone]  Patient Care Team: Pcp, No as PCP - General   Name of the patient: Laura Santiago  [id number]  11/15/1946   Date of visit: 09/16/2021  Diagnosis: Breast Cancer    Chief Complaint: Skin crawling  Current Treatment: Taxol/Perjeta/Ogivri  Oncology History:  Oncology History  Metastatic breast cancer ([place])  2014 Initial Diagnosis   history of breast cancer diagnosed in 2014 s/p chemotherapy with Herceptin and lumpectomy, radiation and antiestrogen therapy that was completed in 2020 (in [name])   03/18/2021 - 03/24/2021 Hospital Admission   New neck and shoulder pain: Scans revealed lytic lesions C5-C6 with pathological compression fractures (developed steroid-induced mania) status post 1 round of radiation (Dr. Sondra Come in [place] was her medical oncologist)   04/19/2021 Relapse/Recurrence   Biopsy of L1 vertebral body on 04/19/21 showed metastatic carcinoma consistent with breast origin involving bone: ER 10%, PR 0%, HER2 ratio 5.1)   05/06/2021 -  Chemotherapy   Taxol (weekly) Herceptin Perjeta palliative chemotherapy started in [name], moved to be closer to her family in [place]       06/28/2021 Cancer Staging   Staging form: Breast, AJCC 8th Edition - Clinical: Stage IV (pM1, ER+, PR-, HER2+) - Signed by [name], MD on 06/28/2021   07/01/2021 -  Chemotherapy   Patient is on Treatment Plan : BREAST Weekly Paclitaxel + Trastuzumab + Pertuzumab q21d x 8 cycles / Trastuzumab + Pertuzumab q21d x 4 cycles       Subjective Data: Laura Santiago is a 75 year old female with above history who presents for her next treatment of chemotherapy.  Her daughter called yesterday asking if somebody would see her due to an episode of confusion/hallucinations where she experienced "something crawling in her scalp" after her last chemo.  After discussing with the patient, she states the  symptoms have been present for several years and appear to have worsened since her last treatment.  She recently started a fentanyl patch which is the only new medication she can think of.  She feels she may be dehydrated.  Reports occasional blurry vision with frequent tearing of the eyes.  All of her symptoms appear to be worse after a shower.  She reports burning in lower extremities.   ECOG: 1 - Symptomatic but completely ambulatory  Review of Systems  Constitutional: Negative.  Negative for chills, fever, malaise/fatigue and weight loss.  HENT:  Negative for congestion, ear pain and tinnitus.   Eyes:  Positive for blurred vision. Negative for double vision.       Frequent tearing of her eyes  Respiratory: Negative.  Negative for cough, sputum production and shortness of breath.   Cardiovascular: Negative.  Negative for chest pain, palpitations and leg swelling.  Gastrointestinal: Negative.  Negative for abdominal pain, constipation, diarrhea, nausea and vomiting.  Genitourinary:  Negative for dysuria, frequency and urgency.  Musculoskeletal:  Positive for myalgias. Negative for back pain and falls.  Skin:  Positive for itching and rash.  Neurological:  Positive for weakness. Negative for headaches.  Endo/Heme/Allergies: Negative.  Does not bruise/bleed easily.  Psychiatric/Behavioral:  Positive for hallucinations. Negative for depression. The patient is nervous/anxious and has insomnia.    Physical Exam Constitutional:      Appearance: Normal appearance.  HENT:     Head: Normocephalic and atraumatic.  Eyes:     Pupils: Pupils are equal, round, and reactive to light.  Cardiovascular:     Rate and  Rhythm: Normal rate and regular rhythm.     Heart sounds: Normal heart sounds. No murmur heard. Pulmonary:     Effort: Pulmonary effort is normal.     Breath sounds: Normal breath sounds. No wheezing.  Abdominal:     General: Bowel sounds are normal. There is no distension.      Palpations: Abdomen is soft.     Tenderness: There is no abdominal tenderness.  Musculoskeletal:        General: Normal range of motion.     Cervical back: Normal range of motion.  Skin:    General: Skin is warm and dry.     Findings: No rash.  Neurological:     Mental Status: She is alert and oriented to person, place, and time.  Psychiatric:        Mood and Affect: Mood is anxious.        Thought Content: Thought content is paranoid.        Judgment: Judgment is impulsive.   Plan/Assessment: Laura Santiago is a 75 year old female currently receiving chemotherapy who is being evaluated for anxiety, tingling sensation in her lower extremities and head.  She has some paranoia.   Patient is concerned that she is having psychotic episodes.  She has some hallucinations.  She states this is happened in the past although not recently.  We reviewed in detail her medications.  She was recently started on fentanyl patch but no other new medications.  There are several medications that potentially could be contributing.  I have asked her to hold her Ambien and stop using her fentanyl patch given this was recently added.  She can try Remeron 15 mg at bedtime to see if this helps with sleep.  We also spoke that symptoms may be worse after chemotherapy due to premedications including dexamethasone which possibly can worsen her symptoms.  Plan- Stop Fentanyl patch.  Stop Ambien.  Start Remeron at bedtime.  Use oxycodone sparingly.  I spent 25 minutes dedicated to the care of this patient (face-to-face and non-face-to-face) on the date of the encounter to include what is described in the assessment and plan.  Faythe Casa, NP 09/19/2021 10:19 AM

## 2021-09-17 NOTE — Telephone Encounter (Signed)
Patient has been saying she gets this crawling sensation over her skin for the last 2 months not just since last chemo

## 2021-09-19 ENCOUNTER — Encounter: Payer: Self-pay | Admitting: Oncology

## 2021-09-21 ENCOUNTER — Encounter (HOSPITAL_BASED_OUTPATIENT_CLINIC_OR_DEPARTMENT_OTHER): Payer: Medicare Other | Admitting: Internal Medicine

## 2021-09-21 ENCOUNTER — Other Ambulatory Visit: Payer: Self-pay

## 2021-09-21 DIAGNOSIS — S81802D Unspecified open wound, left lower leg, subsequent encounter: Secondary | ICD-10-CM

## 2021-09-21 DIAGNOSIS — S81802A Unspecified open wound, left lower leg, initial encounter: Secondary | ICD-10-CM | POA: Diagnosis not present

## 2021-09-21 NOTE — Progress Notes (Signed)
SHRAVYA, WICKWIRE ([id number]) Visit Report for 09/21/2021 Chief Complaint Document Details Patient Name: Laura Santiago, Laura Santiago. Date of Service: 09/21/2021 10:00 AM Medical Record Number: [id number] Patient Account Number: [id number] Date of Birth/Sex: [birth year] (75 y.o. F) Treating RN: Dolan Amen Primary Care Provider: SYSTEM, PCP Other Clinician: Referring Provider: Randa Evens Treating Provider/Extender: Yaakov Guthrie in Treatment: 11 Information Obtained from: Patient Chief Complaint Left lower extremity wound Right toe wounds Electronic Signature(s) Signed: 09/21/2021 11:38:04 AM By: Kalman Shan DO Entered By: Kalman Shan on 09/21/2021 11:29:31 Laura Santiago ([id number]) -------------------------------------------------------------------------------- Debridement Details Patient Name: Laura Santiago. Date of Service: 09/21/2021 10:00 AM Medical Record Number: [id number] Patient Account Number: [id number] Date of Birth/Sex: 1946/03/12 (75 y.o. F) Treating RN: Dolan Amen Primary Care Provider: SYSTEM, PCP Other Clinician: Referring Provider: Randa Evens Treating Provider/Extender: Yaakov Guthrie in Treatment: 11 Debridement Performed for Wound #1 Left,Medial Lower Leg Assessment: Performed By: Physician Kalman Shan, MD Debridement Type: Debridement Severity of Tissue Pre Debridement: Fat layer exposed Level of Consciousness (Pre- Awake and Alert procedure): Pre-procedure Verification/Time Out Yes - 10:51 Taken: Start Time: 10:51 Total Area Debrided (L x W): 9 (cm) x 8 (cm) = 72 (cm) Tissue and other material Viable, Non-Viable, Slough, Subcutaneous, Slough debrided: Level: Skin/Subcutaneous Tissue Debridement Description: Excisional Instrument: Curette Bleeding: Minimum Hemostasis Achieved: Pressure Response to Treatment: Procedure was tolerated well Level of Consciousness (Post- Awake and Alert procedure): Post  Debridement Measurements of Total Wound Length: (cm) 9 Width: (cm) 8 Depth: (cm) 0.2 Volume: (cm) 11.31 Character of Wound/Ulcer Post Debridement: Stable Severity of Tissue Post Debridement: Fat layer exposed Post Procedure Diagnosis Same as Pre-procedure Electronic Signature(s) Signed: 09/21/2021 11:38:04 AM By: Kalman Shan DO Signed: 09/21/2021 4:18:42 PM By: Dolan Amen RN Entered By: Dolan Amen on 09/21/2021 10:52:14 Laura Santiago ([id number]) -------------------------------------------------------------------------------- HPI Details Patient Name: Laura Santiago. Date of Service: 09/21/2021 10:00 AM Medical Record Number: [id number] Patient Account Number: [id number] Date of Birth/Sex: [birth year] (75 y.o. F) Treating RN: Dolan Amen Primary Care Provider: SYSTEM, PCP Other Clinician: Referring Provider: Randa Evens Treating Provider/Extender: Yaakov Guthrie in Treatment: 11 History of Present Illness HPI Description: Admission 7/27 Ms. Monserrate Blaschke is a 75 year old female with a past medical history of ADHD, metastatic breast cancer, stage IV chronic kidney disease, history of DVT on Xarelto and chronic venous insufficiency that presents to the clinic for a chronic left lower extremity wound. She recently moved to [facility] 4 days ago. She was being followed by wound care center in [name]. She reports a 10-year history of wounds to her left lower extremity that eventually do heal with debridement and compression therapy. She states that the current wound reopened 4 months ago and she is using Vaseline and Coban. She denies signs of infection. 8/3; patient presents for 1 week follow-up. She reports no issues or complaints today. She states she had vascular studies done in the last week. She denies signs of infection. She brought her little service dog with her today. 8/17; patient presents for follow-up. She has missed her last  clinic appointment. She states she took the wrap off and attempted to rewrap her leg. She is having difficulty with transportation. She has her service dog with her today. Overall she feels well and reports improvement in wound healing. She denies signs of infection. She reports owning an old Velcro wrap compression and has this at her living facility 9/14; patient presents for follow-up. Patient states that over the past 2 to 3  weeks she developed toe wounds to her right foot. She attributes this to tight fitting shoes. She subsequently developed cellulitis in the right leg and has been treated by doxycycline by her oncologist. She reports improvement in symptoms however continues to have some redness and swelling to this leg. To the left lower extremity patient has been having her wraps changed with home health twice weekly. She states that the [facility] is not helping control the drainage. Other than that she has no issues or complaints today. She denies signs of infection to the left lower extremity. 9/21; patient presents for follow-up. She reports seeing infectious disease for her cellulitis. She reports no further management. She has home health that changes the wraps twice weekly. She has no issues or complaints today. She denies signs of infection. 10/5; patient presents for follow-up. She has no issues or complaints today. She denies signs of infection. She states that the right great toe has not been dressed by home health. 10/12; patient presents for follow-up. She has no issues or complaints today. She reports improvement in her wound healing. She has been using silver alginate to the right great toe wound. She denies signs of infection. Electronic Signature(s) Signed: 09/21/2021 11:38:04 AM By: Kalman Shan DO Entered By: Kalman Shan on 09/21/2021 11:30:21 Laura Santiago  ([id number]) -------------------------------------------------------------------------------- Physical Exam Details Patient Name: Laura Santiago, Laura Santiago. Date of Service: 09/21/2021 10:00 AM Medical Record Number: [id number] Patient Account Number: [id number] Date of Birth/Sex: 29-Apr-1946 (75 y.o. F) Treating RN: Dolan Amen Primary Care Provider: SYSTEM, PCP Other Clinician: Referring Provider: Randa Evens Treating Provider/Extender: Yaakov Guthrie in Treatment: 11 Constitutional . Cardiovascular . Psychiatric . Notes Left lower extremity: Large open wound to the medial aspect with nonviable tissue and granulation tissue present. Right foot: To the right great toe there is an open wound with circumferential callus and undermining with granulation tissue present. No signs of infection on exam Electronic Signature(s) Signed: 09/21/2021 11:38:04 AM By: Kalman Shan DO Entered By: Kalman Shan on 09/21/2021 11:34:50 Laura Santiago ([id number]) -------------------------------------------------------------------------------- Physician Orders Details Patient Name: Laura Santiago. Date of Service: 09/21/2021 10:00 AM Medical Record Number: [id number] Patient Account Number: [id number] Date of Birth/Sex: 10-17-1946 (75 y.o. F) Treating RN: Dolan Amen Primary Care Provider: SYSTEM, PCP Other Clinician: Referring Provider: Randa Evens Treating Provider/Extender: Yaakov Guthrie in Treatment: 11 Verbal / Phone Orders: No Diagnosis Coding Follow-up Appointments o Return Appointment in 1 week. o Nurse Visit as needed [place] for wound care. May utilize formulary equivalent dressing for wound treatment orders unless otherwise specified. Home Health Nurse may visit PRN to address patientos wound care needs. o Scheduled days for dressing changes to be completed; exception, patient has  scheduled wound care visit that day. - Twice a week, patient comes to office on Wednesday o **Please direct any NON-WOUND related issues/requests for orders to patient's Primary Care Physician. **If current dressing causes regression in wound condition, may D/C ordered dressing product/s and apply Normal Saline Moist Dressing daily until next [place] or Other MD appointment. **Notify [facility]aling Center of regression in wound condition at [phone]. Bathing/ Shower/ Hygiene o May shower with wound dressing protected with water repellent cover or cast protector. o No tub bath. Wound Treatment Wound #1 - Lower Leg Wound Laterality: Left, Medial Cleanser: Soap and Water 3 x Per Week/30 Days Discharge Instructions: Gently cleanse wound with antibacterial soap, rinse and pat dry  prior to dressing wounds Peri-Wound Care: Desitin Maximum Strength Ointment 4 (oz) 3 x Per Week/30 Days Discharge Instructions: Apply periwound Topical: Gentamicin 3 x Per Week/30 Days Discharge Instructions: Apply as directed by provider. Primary Dressing: Cutimed Sorbact 1.5x 2.38 (in/in) 3 x Per Week/30 Days Discharge Instructions: A bacteria- and fungi binding wound dressing, suitable for cavities and fistulas. It is suitable as a wound filler and allows the passage of wound exudate into a secondary dressing. The dressing helps reducing odor and pain and can improve healing. Secondary Dressing: Xtrasorb Large 6x9 (in/in) 3 x Per Week/30 Days Discharge Instructions: Apply to wound as directed. Do not cut. Compression Wrap: Profore Lite LF 3 Multilayer Compression Bandaging System 3 x Per Week/30 Days Discharge Instructions: Apply 3 multi-layer wrap as prescribed. Wound #2 - Toe Great Wound Laterality: Right Cleanser: Wound Cleanser 1 x Per Day/30 Days Discharge Instructions: Wash your hands with soap and water. Remove old dressing, discard into plastic bag and place into trash. Cleanse the  wound with Wound Cleanser prior to applying a clean dressing using gauze sponges, not tissues or cotton balls. Do not scrub or use excessive force. Pat dry using gauze sponges, not tissue or cotton balls. Primary Dressing: Silvercel Small 2x2 (in/in) 1 x Per Day/30 Days Discharge Instructions: Apply Silvercel Small 2x2 (in/in) as instructed Secondary Dressing: Gauze 1 x Per Day/30 Days Discharge Instructions: Cover with dry gauze Secured With: 54M Medipore H Soft Cloth Surgical Tape, 2x2 (in/yd) 1 x Per Day/30 Days Discharge Instructions: Secure dressing Laura Santiago, Laura Santiago ([id number]) Patient Medications Allergies: Celebrex Notifications Medication Indication Start End gentamicin 09/21/2021 DOSE 1 - topical 0.1 % cream - 1 application under the wrap Electronic Signature(s) Signed: 09/21/2021 2:50:02 PM By: Kalman Shan DO Previous Signature: 09/21/2021 11:38:04 AM Version By: Kalman Shan DO Entered By: Kalman Shan on 09/21/2021 14:50:02 Laura Santiago ([id number]) -------------------------------------------------------------------------------- Problem List Details Patient Name: Laura Santiago. Date of Service: 09/21/2021 10:00 AM Medical Record Number: [id number] Patient Account Number: [id number] Date of Birth/Sex: 09/07/1946 (74 y.o. F) Treating RN: Dolan Amen Primary Care Provider: SYSTEM, PCP Other Clinician: Referring Provider: Randa Evens Treating Provider/Extender: Yaakov Guthrie in Treatment: 11 Active Problems ICD-10 Encounter Code Description Active Date MDM Diagnosis S81.802D Unspecified open wound, left lower leg, subsequent encounter 07/13/2021 No Yes S91.101D Unspecified open wound of right great toe without damage to nail, 08/31/2021 No Yes subsequent encounter I87.2 Venous insufficiency (chronic) (peripheral) 07/06/2021 No Yes 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 07/06/2021 No Yes I10 Essential (primary) hypertension 07/06/2021  No Yes Z79.01 Long term (current) use of anticoagulants 07/06/2021 No Yes Inactive Problems ICD-10 Code Description Active Date Inactive Date S81.802A Unspecified open wound, left lower leg, initial encounter 07/06/2021 07/06/2021 S91.101A Unspecified open wound of right great toe without damage to nail, initial 08/24/2021 08/24/2021 encounter S91.104A Unspecified open wound of right lesser toe(s) without damage to nail, initial 08/24/2021 08/24/2021 encounter Resolved Problems ICD-10 Code Description Active Date Resolved Date S91.104D Unspecified open wound of right lesser toe(s) without damage to nail, 08/31/2021 08/31/2021 subsequent encounter Laura Santiago, Laura Santiago ([id number]) Electronic Signature(s) Signed: 09/21/2021 11:38:04 AM By: Kalman Shan DO Entered By: Kalman Shan on 09/21/2021 11:29:14 Laura Santiago ([id number]) -------------------------------------------------------------------------------- Progress Note Details Patient Name: Laura Santiago. Date of Service: 09/21/2021 10:00 AM Medical Record Number: [id number] Patient Account Number: [id number] Date of Birth/Sex: 1946/09/27 (75 y.o. F) Treating RN: Dolan Amen Primary Care Provider: SYSTEM, PCP Other Clinician: Referring Provider: Randa Evens Treating Provider/Extender: Yaakov Guthrie  in Treatment: 11 Subjective Chief Complaint Information obtained from Patient Left lower extremity wound Right toe wounds History of Present Illness (HPI) Admission 7/27 Ms. Laura Santiago is a 75 year old female with a past medical history of ADHD, metastatic breast cancer, stage IV chronic kidney disease, history of DVT on Xarelto and chronic venous insufficiency that presents to the clinic for a chronic left lower extremity wound. She recently moved to [facility] 4 days ago. She was being followed by wound care center in [name]. She reports a 10-year history of wounds to her left lower extremity that  eventually do heal with debridement and compression therapy. She states that the current wound reopened 4 months ago and she is using Vaseline and Coban. She denies signs of infection. 8/3; patient presents for 1 week follow-up. She reports no issues or complaints today. She states she had vascular studies done in the last week. She denies signs of infection. She brought her little service dog with her today. 8/17; patient presents for follow-up. She has missed her last clinic appointment. She states she took the wrap off and attempted to rewrap her leg. She is having difficulty with transportation. She has her service dog with her today. Overall she feels well and reports improvement in wound healing. She denies signs of infection. She reports owning an old Velcro wrap compression and has this at her living facility 9/14; patient presents for follow-up. Patient states that over the past 2 to 3 weeks she developed toe wounds to her right foot. She attributes this to tight fitting shoes. She subsequently developed cellulitis in the right leg and has been treated by doxycycline by her oncologist. She reports improvement in symptoms however continues to have some redness and swelling to this leg. To the left lower extremity patient has been having her wraps changed with home health twice weekly. She states that the [facility] is not helping control the drainage. Other than that she has no issues or complaints today. She denies signs of infection to the left lower extremity. 9/21; patient presents for follow-up. She reports seeing infectious disease for her cellulitis. She reports no further management. She has home health that changes the wraps twice weekly. She has no issues or complaints today. She denies signs of infection. 10/5; patient presents for follow-up. She has no issues or complaints today. She denies signs of infection. She states that the right great toe has not been dressed by home  health. 10/12; patient presents for follow-up. She has no issues or complaints today. She reports improvement in her wound healing. She has been using silver alginate to the right great toe wound. She denies signs of infection. Patient History Information obtained from Patient. Social History Never smoker. Medical History Eyes Denies history of Cataracts, Glaucoma, Optic Neuritis Ear/Nose/Mouth/Throat Denies history of Chronic sinus problems/congestion, Middle ear problems Hematologic/Lymphatic Denies history of Anemia, Hemophilia, Human Immunodeficiency Virus, Lymphedema, Sickle Cell Disease Respiratory Denies history of Aspiration, Asthma, Chronic Obstructive Pulmonary Disease (COPD), Pneumothorax, Sleep Apnea, Tuberculosis Cardiovascular Patient has history of Hypertension Denies history of Angina, Arrhythmia, Congestive Heart Failure, Coronary Artery Disease, Deep Vein Thrombosis, Hypotension, Myocardial Infarction, Peripheral Arterial Disease, Peripheral Venous Disease, Phlebitis, Vasculitis Gastrointestinal Denies history of Cirrhosis , Colitis, Crohn s, Hepatitis A, Hepatitis B, Hepatitis C Endocrine Denies history of Type I Diabetes, Type II Diabetes Genitourinary Denies history of End Stage Renal Disease Immunological Denies history of Lupus Erythematosus, Raynaud s, Scleroderma Integumentary (Skin) Denies history of History of Burn, History of  pressure wounds Musculoskeletal Laura Santiago, Laura Santiago ([id number]) Patient has history of Osteoarthritis Denies history of Gout, Rheumatoid Arthritis, Osteomyelitis Oncologic Patient has history of Received Chemotherapy, Received Radiation Medical And Surgical History Notes Oncologic breast cancer Objective Constitutional Vitals Time Taken: 10:20 AM, Height: 66 in, Weight: 153 lbs, BMI: 24.7, Temperature: 98.9 F, Pulse: 82 bpm, Respiratory Rate: 18 breaths/min, Blood Pressure: 124/79 mmHg. General Notes: Left lower extremity:  Large open wound to the medial aspect with nonviable tissue and granulation tissue present. Right foot: To the right great toe there is an open wound with circumferential callus and undermining with granulation tissue present. No signs of infection on exam Integumentary (Hair, Skin) Wound #1 status is Open. Original cause of wound was Gradually Appeared. The date acquired was: 04/06/2021. The wound has been in treatment 11 weeks. The wound is located on the Left,Medial Lower Leg. The wound measures 9cm length x 8cm width x 0.2cm depth; 56.549cm^2 area and 11.31cm^3 volume. There is Fat Layer (Subcutaneous Tissue) exposed. There is no tunneling or undermining noted. There is a large amount of purulent drainage noted. There is small (1-33%) red granulation within the wound bed. There is a large (67-100%) amount of necrotic tissue within the wound bed including Adherent Slough. General Notes: Periwound macerated Wound #2 status is Open. Original cause of wound was Gradually Appeared. The date acquired was: 08/24/2021. The wound has been in treatment 4 weeks. The wound is located on the Right Toe Great. The wound measures 0.3cm length x 0.4cm width x 0.2cm depth; 0.094cm^2 area and 0.019cm^3 volume. There is Fat Layer (Subcutaneous Tissue) exposed. There is no tunneling or undermining noted. There is a medium amount of serous drainage noted. The wound margin is thickened. There is large (67-100%) red granulation within the wound bed. There is a small (1-33%) amount of necrotic tissue within the wound bed including Adherent Slough. Assessment Active Problems ICD-10 Unspecified open wound, left lower leg, subsequent encounter Unspecified open wound of right great toe without damage to nail, subsequent encounter Venous insufficiency (chronic) (peripheral) Secondary malignant neoplasm of breast Essential (primary) hypertension Long term (current) use of anticoagulants Patient's left lower extremity  wound has improved in appearance since last clinic visit. There is increased maceration. I debrided nonviable tissue. I recommended switching the dressing to sorbact and continue gentamicin with Xtrasorb to help with drainage Under compression wrap. No signs of infection on exam. I recommended continuing silver alginate to the right great toe. 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 There was a Excisional Skin/Subcutaneous Tissue Debridement with a total area of 72 sq cm performed by Kalman Shan, MD. With Laura Santiago ([id number]) the following instrument(s): Curette to remove Viable and Non-Viable tissue/material. Material removed includes Subcutaneous Tissue and Slough and. A time out was conducted at 10:51, prior to the start of the procedure. A Minimum amount of bleeding was controlled with Pressure. The procedure was tolerated well. Post Debridement Measurements: 9cm length x 8cm width x 0.2cm depth; 11.31cm^3 volume. Character of Wound/Ulcer Post Debridement is stable. Severity of Tissue Post Debridement is: Fat layer exposed. 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 There was a Three Layer Compression Therapy Procedure with a pre-treatment ABI of 1.5 by Dolan Amen, RN. Post procedure Diagnosis Wound #1: Same as  Pre-Procedure Plan Follow-up Appointments: Return Appointment in 1 week. Nurse Visit as needed Home Health: [place]: - [place] for wound care. May utilize formulary equivalent dressing for wound treatment orders unless otherwise specified. Home Health Nurse may visit PRN to address patient s wound care needs. Scheduled days for dressing changes to be completed; exception, patient has  scheduled wound care visit that day. - Twice a week, patient comes to office on Wednesday **Please direct any NON-WOUND related issues/requests for orders to patient's Primary Care Physician. **If current dressing causes regression in wound condition, may D/C ordered dressing product/s and apply Normal Saline Moist Dressing daily until next [place] or Other MD appointment. **Notify Wound Healing Center of regression in wound condition at [phone]. Bathing/ Shower/ Hygiene: May shower with wound dressing protected with water repellent cover or cast protector. No tub bath. WOUND #1: - Lower Leg Wound Laterality: Left, Medial Cleanser: Soap and Water 3 x Per Week/30 Days Discharge Instructions: Gently cleanse wound with antibacterial soap, rinse and pat dry prior to dressing wounds Peri-Wound Care: Desitin Maximum Strength Ointment 4 (oz) 3 x Per Week/30 Days Discharge Instructions: Apply periwound Topical: Gentamicin 3 x Per Week/30 Days Discharge Instructions: Apply as directed by provider. Primary Dressing: Cutimed Sorbact 1.5x 2.38 (in/in) 3 x Per Week/30 Days Discharge Instructions: A bacteria- and fungi binding wound dressing, suitable for cavities and fistulas. It is suitable as a wound filler and allows the passage of wound exudate into a secondary dressing. The dressing helps reducing odor and pain and can improve healing. Secondary Dressing: Xtrasorb Large 6x9 (in/in) 3 x Per Week/30 Days Discharge Instructions: Apply to wound as directed. Do not cut. Compression Wrap: Profore Lite LF 3 Multilayer Compression Bandaging System 3 x Per Week/30 Days Discharge Instructions: Apply 3 multi-layer wrap as prescribed. WOUND #2: - Toe Great Wound Laterality: Right Cleanser: Wound Cleanser 1 x Per Day/30 Days Discharge Instructions: Wash your hands with soap and water. Remove old dressing, discard into plastic bag and place into trash. Cleanse the wound with Wound Cleanser  prior to applying a clean dressing using gauze sponges, not tissues or cotton balls. Do not scrub or use excessive force. Pat dry using gauze sponges, not tissue or cotton balls. Primary Dressing: Silvercel Small 2x2 (in/in) 1 x Per Day/30 Days Discharge Instructions: Apply Silvercel Small 2x2 (in/in) as instructed Secondary Dressing: Gauze 1 x Per Day/30 Days Discharge Instructions: Cover with dry gauze Secured With: 15M Medipore H Soft Cloth Surgical Tape, 2x2 (in/yd) 1 x Per Day/30 Days Discharge Instructions: Secure dressing 1. Gentamicin, sorbact under compression wrap 2. Patient asked to follow-up in 2 weeks. She has home health. Electronic Signature(s) Signed: 09/21/2021 11:38:04 AM By: Kalman Shan DO Entered By: Kalman Shan on 09/21/2021 11:37:35 Laura Santiago ([id number]) -------------------------------------------------------------------------------- ROS/PFSH Details Patient Name: Laura Santiago. Date of Service: 09/21/2021 10:00 AM Medical Record Number: [id number] Patient Account Number: [id number] Date of Birth/Sex: [birth year] (75 y.o. F) Treating RN: Dolan Amen Primary Care Provider: SYSTEM, PCP Other Clinician: Referring Provider: Randa Evens Treating Provider/Extender: Yaakov Guthrie in Treatment: 11 Information Obtained From Patient Eyes Medical History: Negative for: Cataracts; Glaucoma; Optic Neuritis Ear/Nose/Mouth/Throat Medical History: Negative for: Chronic sinus problems/congestion; Middle ear problems Hematologic/Lymphatic Medical History: Negative for: Anemia; Hemophilia; Human Immunodeficiency Virus; Lymphedema; Sickle Cell Disease Respiratory Medical History: Negative for: Aspiration; Asthma; Chronic Obstructive Pulmonary Disease (COPD); Pneumothorax; Sleep Apnea; Tuberculosis Cardiovascular Medical History: Positive for: Hypertension Negative for: Angina; Arrhythmia; Congestive Heart  Failure; Coronary Artery Disease;  Deep Vein Thrombosis; Hypotension; Myocardial Infarction; Peripheral Arterial Disease; Peripheral Venous Disease; Phlebitis; Vasculitis Gastrointestinal Medical History: Negative for: Cirrhosis ; Colitis; Crohnos; Hepatitis A; Hepatitis B; Hepatitis C Endocrine Medical History: Negative for: Type I Diabetes; Type II Diabetes Genitourinary Medical History: Negative for: End Stage Renal Disease Immunological Medical History: Negative for: Lupus Erythematosus; Raynaudos; Scleroderma Integumentary (Skin) Medical History: Negative for: History of Burn; History of pressure wounds Musculoskeletal Medical History: Positive for: Osteoarthritis Laura Santiago, Laura Santiago ([id number]) Negative for: Gout; Rheumatoid Arthritis; Osteomyelitis Oncologic Medical History: Positive for: Received Chemotherapy; Received Radiation Past Medical History Notes: breast cancer Immunizations Pneumococcal Vaccine: Received Pneumococcal Vaccination: No Implantable Devices None Family and Social History Never smoker Electronic Signature(s) Signed: 09/21/2021 11:38:04 AM By: Kalman Shan DO Signed: 09/21/2021 4:18:42 PM By: Dolan Amen RN Entered By: Kalman Shan on 09/21/2021 11:30:28 Laura Santiago, Laura Santiago ([id number]) -------------------------------------------------------------------------------- [place] Details Patient Name: Laura Santiago. Date of Service: 09/21/2021 Medical Record Number: [id number] Patient Account Number: [id number] Date of Birth/Sex: 1946/03/04 (75 y.o. F) Treating RN: Dolan Amen Primary Care Provider: SYSTEM, PCP Other Clinician: Referring Provider: Randa Evens Treating Provider/Extender: Yaakov Guthrie in Treatment: 11 Diagnosis Coding ICD-10 Codes Code Description S81.802D Unspecified open wound, left lower leg, subsequent encounter S91.101D Unspecified open wound of right great toe without damage to nail, subsequent encounter I87.2 Venous  insufficiency (chronic) (peripheral) 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 I10 Essential (primary) hypertension Z79.01 Long term (current) use of anticoagulants Facility Procedures CPT4 Code: 08657846 Description: 96295 - DEB SUBQ TISSUE 20 SQ CM/< Modifier: Quantity: 1 CPT4 Code: Description: ICD-10 Diagnosis Description S81.802D Unspecified open wound, left lower leg, subsequent encounter Modifier: Quantity: CPT4 Code: 28413244 Description: 01027 - DEB SUBQ TISS EA ADDL 20CM Modifier: Quantity: 3 CPT4 Code: Description: ICD-10 Diagnosis Description S81.802D Unspecified open wound, left lower leg, subsequent encounter Modifier: Quantity: Physician Procedures CPT4 Code: 2536644 Description: 03474 - WC PHYS SUBQ TISS 20 SQ CM Modifier: Quantity: 1 CPT4 Code: Description: ICD-10 Diagnosis Description S81.802D Unspecified open wound, left lower leg, subsequent encounter Modifier: Quantity: CPT4 Code: 2595638 Description: 75643 - WC PHYS SUBQ TISS EA ADDL 20 CM Modifier: Quantity: 3 CPT4 Code: Description: ICD-10 Diagnosis Description S81.802D Unspecified open wound, left lower leg, subsequent encounter Modifier: Quantity: Electronic Signature(s) Signed: 09/21/2021 11:38:04 AM By: Kalman Shan DO Entered By: Kalman Shan on 09/21/2021 11:37:44

## 2021-09-21 NOTE — Progress Notes (Signed)
[name], [name] ([id number]) Visit Report for 09/21/2021 Arrival Information Details Patient Name: Laura Santiago, Laura Santiago. Date of Service: 09/21/2021 10:00 AM Medical Record Number: [id number] Patient Account Number: [id number] Date of Birth/Sex: 04/24/1946 (75 y.o. F) Treating RN: Dolan Amen Primary Care Kutler Vanvranken: SYSTEM, PCP Other Clinician: Referring Lauro Manlove: Randa Evens Treating Caedence Snowden/Extender: Yaakov Guthrie in Treatment: 11 Visit Information History Since Last Visit Pain Present Now: No Patient Arrived: Walker Arrival Time: 10:20 Accompanied By: service animal/self Transfer Assistance: None Patient Identification Verified: Yes Secondary Verification Process Completed: Yes Patient Has Alerts: Yes Patient Alerts: PT HAS SERVICE ANIMAL Electronic Signature(s) Signed: 09/21/2021 4:18:42 PM By: Dolan Amen RN Entered By: Dolan Amen on 09/21/2021 10:20:23 Laura Santiago ([id number]) -------------------------------------------------------------------------------- Clinic Level of Care Assessment Details Patient Name: Laura Santiago. Date of Service: 09/21/2021 10:00 AM Medical Record Number: [id number] Patient Account Number: [id number] Date of Birth/Sex: 06-25-1946 (75 y.o. F) Treating RN: Dolan Amen Primary Care Lashuna Tamashiro: SYSTEM, PCP Other Clinician: Referring Latonyia Lopata: Randa Evens Treating Aubrynn Katona/Extender: Yaakov Guthrie in Treatment: 11 Clinic Level of Care Assessment Items TOOL 1 Quantity Score _0  - Use when EandM and Procedure is performed on INITIAL visit 0 ASSESSMENTS - Nursing Assessment / Reassessment _1  - General Physical Exam (combine w/ comprehensive assessment (listed just below) when performed on new 0 pt. evals) _2  - 0 Comprehensive Assessment (HX, ROS, Risk Assessments, Wounds Hx, etc.) ASSESSMENTS - Wound and Skin Assessment / Reassessment _3  - Dermatologic / Skin Assessment (not related to wound area)  0 ASSESSMENTS - Ostomy and/or Continence Assessment and Care _4  - Incontinence Assessment and Management 0 _5  - 0 Ostomy Care Assessment and Management (repouching, etc.) PROCESS - Coordination of Care _6  - Simple Patient / Family Education for ongoing care 0 _7  - 0 Complex (extensive) Patient / Family Education for ongoing care _8  - 0 Staff obtains Programmer, systems, Records, Test Results / Process Orders _9  - 0 Staff telephones HHA, Nursing Homes / Clarify orders / etc _10  - 0 Routine Transfer to another Facility (non-emergent condition) _11  - 0 Routine Hospital Admission (non-emergent condition) _12  - 0 New Admissions / Biomedical engineer / Ordering NPWT, Apligraf, etc. _13  - 0 Emergency Hospital Admission (emergent condition) PROCESS - Special Needs _14  - Pediatric / Minor Patient Management 0 _15  - 0 Isolation Patient Management _16  - 0 Hearing / Language / Visual special needs _17  - 0 Assessment of Community assistance (transportation, D/C planning, etc.) _18  - 0 Additional assistance / Altered mentation _19  - 0 Support Surface(s) Assessment (bed, cushion, seat, etc.) INTERVENTIONS - Miscellaneous _20  - External ear exam 0 _21  - 0 Patient Transfer (multiple staff / Civil Service fast streamer / Similar devices) _22  - 0 Simple Staple / Suture removal (25 or less) _23  - 0 Complex Staple / Suture removal (26 or more) _24  - 0 Hypo/Hyperglycemic Management (do not check if billed separately) _25  - 0 Ankle / Brachial Index (ABI) - do not check if billed separately Has the patient been seen at the hospital within the last three years: Yes Total Score: 0 Level Of Care: ____ Laura Santiago ([id number]) Electronic Signature(s) Signed: 09/21/2021 4:18:42 PM By: Dolan Amen RN Entered By: Dolan Amen on 09/21/2021 11:09:50 Laura Santiago ([id number]) -------------------------------------------------------------------------------- Compression Therapy Details Patient Name: Laura Santiago. Date of Service: 09/21/2021 10:00 AM Medical Record Number: [id number] Patient Account Number: [id number] Date of Birth/Sex: Mar 25, 1946 (75 y.o. F) Treating RN: Dolan Amen Primary Care Ryelan Kazee: SYSTEM, PCP Other Clinician: Referring Macen Joslin: Randa Evens Treating Merina Behrendt/Extender: Kalman Shan  Weeks in Treatment: 11 Compression Therapy Performed for Wound Assessment: Wound #1 Left,Medial Lower Leg Performed By: Cora Daniels, RN Compression Type: Three Layer Pre Treatment ABI: 1.5 Post Procedure Diagnosis Same as Pre-procedure Electronic Signature(s) Signed: 09/21/2021 4:18:42 PM By: Dolan Amen RN Entered By: Dolan Amen on 09/21/2021 10:50:51 CLARETTA, KENDRA ([id number]) -------------------------------------------------------------------------------- Encounter Discharge Information Details Patient Name: Laura Santiago. Date of Service: 09/21/2021 10:00 AM Medical Record Number: [id number] Patient Account Number: [id number] Date of Birth/Sex: 1946/05/16 (75 y.o. F) Treating RN: Dolan Amen Primary Care Areta Terwilliger: SYSTEM, PCP Other Clinician: Referring Jaelon Gatley: Randa Evens Treating Megyn Leng/Extender: Yaakov Guthrie in Treatment: 11 Encounter Discharge Information Items Post Procedure Vitals Discharge Condition: Stable Temperature (F): 98.9 Ambulatory Status: Walker Pulse (bpm): 82 Discharge Destination: Home Respiratory Rate (breaths/min): 18 Transportation: Private Auto Blood Pressure (mmHg): 124/79 Accompanied By: self Schedule Follow-up Appointment: Yes Clinical Summary of Care: Electronic Signature(s) Signed: 09/21/2021 2:24:11 PM By: Dolan Amen RN Entered By: Dolan Amen on 09/21/2021 14:24:11 Laura Santiago ([id number]) -------------------------------------------------------------------------------- Lower Extremity Assessment Details Patient Name: Laura Santiago. Date of Service: 09/21/2021 10:00  AM Medical Record Number: [id number] Patient Account Number: [id number] Date of Birth/Sex: 12-04-1946 (75 y.o. F) Treating RN: Dolan Amen Primary Care Andelyn Spade: SYSTEM, PCP Other Clinician: Referring James Lafalce: Randa Evens Treating Arina Torry/Extender: Yaakov Guthrie in Treatment: 11 Edema Assessment Assessed: [Left: Yes] [Right: No] Edema: [Left: Ye] [Right: s] Calf Left: Right: Point of Measurement: 29 cm From Medial Instep 36.5 cm Ankle Left: Right: Point of Measurement: 10 cm From Medial Instep 22.5 cm Electronic Signature(s) Signed: 09/21/2021 4:18:42 PM By: Dolan Amen RN Entered By: Dolan Amen on 09/21/2021 10:48:13 Laura Santiago ([id number]) -------------------------------------------------------------------------------- Multi Wound Chart Details Patient Name: Laura Santiago. Date of Service: 09/21/2021 10:00 AM Medical Record Number: [id number] Patient Account Number: [id number] Date of Birth/Sex: 1946-05-05 (75 y.o. 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 Comorbid History: Hypertension, Osteoarthritis, Hypertension, Osteoarthritis, N/A Received Chemotherapy, Received Received Chemotherapy, Received Radiation Radiation Date Acquired: 04/06/2021 08/24/2021 N/A Weeks of Treatment: 11 4 N/A Wound Status: Open Open N/A Clustered Wound: Yes No N/A Clustered Quantity: 2 N/A N/A Measurements L x W x D (cm) 9x8x0.2 0.3x0.4x0.2 N/A Area  (cm) : 56.549 0.094 N/A Volume (cm) : 11.31 0.019 N/A % Reduction in Area: 37.40% 40.10% N/A % Reduction in Volume: 37.40% 69.80% N/A Classification: Full Thickness Without Exposed Full Thickness Without Exposed N/A Support Structures Support Structures Exudate Amount: Large Medium N/A Exudate Type: Purulent Serous N/A Exudate Color: yellow, brown, green amber N/A Wound Margin: N/A Thickened N/A Granulation Amount: Small (1-33%) Large (67-100%) N/A Granulation Quality: Red Red N/A Necrotic Amount: Large (67-100%) Small (1-33%) N/A Exposed Structures: Fat Layer (Subcutaneous Tissue): Fat Layer (Subcutaneous Tissue): N/A Yes Yes Fascia: No Fascia: No Tendon: No Tendon: No Muscle: No Muscle: No Joint: No Joint: No Bone: No Bone: No Epithelialization: Large (67-100%) None N/A Debridement: Debridement - Excisional N/A N/A Pre-procedure Verification/Time 10:51 N/A N/A Out Taken: Tissue Debrided: Subcutaneous, Slough N/A N/A Level: Skin/Subcutaneous Tissue N/A N/A Debridement Area (sq cm): 72 N/A N/A Instrument: Curette N/A N/A Bleeding: Minimum N/A  N/A Hemostasis Achieved: Pressure N/A N/A MASSIAH, LONGANECKER ([id number]) Debridement Treatment Procedure was tolerated well N/A N/A Response: Post Debridement 9x8x0.2 N/A N/A Measurements L x W x D (cm) Post Debridement Volume: 11.31 N/A N/A (cm) Assessment Notes: Periwound macerated N/A N/A Procedures Performed: Compression Therapy N/A N/A Debridement Treatment Notes Electronic Signature(s) Signed: 09/21/2021 11:38:04 AM By: Kalman Shan DO Entered By: Kalman Shan on 09/21/2021 11:29:21 Laura Santiago ([id number]) -------------------------------------------------------------------------------- [place] Details Patient Name: Laura Santiago. Date of Service: 09/21/2021 10:00 AM Medical Record Number: [id number] Patient Account Number: [id number] Date of Birth/Sex: 03-15-1946 (75 y.o.  F) Treating RN: Dolan Amen Primary Care Bohdan Macho: SYSTEM, PCP Other Clinician: Referring Promise Weldin: Randa Evens Treating Walaa Carel/Extender: Yaakov Guthrie in Treatment: 11 Active Inactive Necrotic Tissue Nursing Diagnoses: Impaired tissue integrity related to necrotic/devitalized tissue Knowledge deficit related to management of necrotic/devitalized tissue Goals: Necrotic/devitalized tissue will be minimized in the wound bed Date Initiated: 07/06/2021 Date Inactivated: 07/27/2021 Target Resolution Date: 07/06/2021 Goal Status: Met Patient/caregiver will verbalize understanding of reason and process for debridement of necrotic tissue Date Initiated: 07/06/2021 Target Resolution Date: 07/06/2021 Goal Status: Active Interventions: Assess patient pain level pre-, during and post procedure and prior to discharge Provide education on necrotic tissue and debridement process Treatment Activities: Apply topical anesthetic as ordered : 07/06/2021 Biologic debridement : 07/06/2021 Enzymatic debridement : 07/06/2021 Excisional debridement : 07/06/2021 Notes: Wound/Skin Impairment Nursing Diagnoses: Impaired tissue integrity Goals: Patient/caregiver will verbalize understanding of skin care regimen Date Initiated: 07/06/2021 Date Inactivated: 07/27/2021 Target Resolution Date: 07/06/2021 Goal Status: Met Ulcer/skin breakdown will have a volume reduction of 30% by week 4 Date Initiated: 07/06/2021 Target Resolution Date: 08/06/2021 Goal Status: Active Ulcer/skin breakdown will have a volume reduction of 50% by week 8 Date Initiated: 07/06/2021 Target Resolution Date: 09/06/2021 Goal Status: Active Ulcer/skin breakdown will have a volume reduction of 80% by week 12 Date Initiated: 07/06/2021 Target Resolution Date: 10/06/2021 Goal Status: Active Ulcer/skin breakdown will heal within 14 weeks Date Initiated: 07/06/2021 Target Resolution Date: 11/06/2021 Goal Status:  Active Interventions: Assess patient/caregiver ability to obtain necessary supplies Assess patient/caregiver ability to perform ulcer/skin care regimen upon admission and as needed Assess ulceration(s) every visit REISA, COPPOLA ([id number]) Treatment Activities: Referred to DME Evelise Reine for dressing supplies : 07/06/2021 Skin care regimen initiated : 07/06/2021 Notes: Electronic Signature(s) Signed: 09/21/2021 4:18:42 PM By: Dolan Amen RN Entered By: Dolan Amen on 09/21/2021 10:48:18 Laura Santiago ([id number]) -------------------------------------------------------------------------------- Pain Assessment Details Patient Name: Laura Santiago. Date of Service: 09/21/2021 10:00 AM Medical Record Number: [id number] Patient Account Number: [id number] Date of Birth/Sex: Jul 23, 1946 (75 y.o. F) Treating RN: Dolan Amen Primary Care Kinjal Neitzke: SYSTEM, PCP Other Clinician: Referring Genevia Bouldin: Randa Evens Treating Moises Terpstra/Extender: Yaakov Guthrie in Treatment: 11 Active Problems Location of Pain Severity and Description of Pain Patient Has Paino No Site Locations Pain Management and Medication Current Pain Management: Electronic Signature(s) Signed: 09/21/2021 4:18:42 PM By: Dolan Amen RN Entered By: Dolan Amen on 09/21/2021 10:21:05 Laura Santiago ([id number]) -------------------------------------------------------------------------------- Patient/Caregiver Education Details Patient Name: Laura Santiago. Date of Service: 09/21/2021 10:00 AM Medical Record Number: [id number] Patient Account Number: [id number] Date of Birth/Gender: [birth year] (75 y.o. F) Treating RN: Dolan Amen Primary Care Physician: SYSTEM, PCP Other Clinician: Referring Physician: Randa Evens Treating Physician/Extender: Yaakov Guthrie in Treatment: 11 Education Assessment Education Provided To: Patient Education Topics Provided Wound/Skin  Impairment: Methods: Explain/Verbal Responses: State content correctly Electronic Signature(s) Signed: 09/21/2021 4:18:42 PM By: Dolan Amen RN Entered  By: Dolan Amen on 09/21/2021 11:10:07 Laura Santiago ([id number]) -------------------------------------------------------------------------------- Wound Assessment Details Patient Name: TARAE, WOODEN. Date of Service: 09/21/2021 10:00 AM Medical Record Number: [id number] Patient Account Number: [id number] Date of Birth/Sex: 03/30/1946 (75 y.o. F) Treating RN: Dolan Amen Primary Care Hani Patnode: SYSTEM, PCP Other Clinician: Referring Nillie Bartolotta: Randa Evens Treating Marycruz Boehner/Extender: Yaakov Guthrie in Treatment: 11 Wound Status Wound Number: 1 Primary Venous Leg Ulcer Etiology: Wound Location: Left, Medial Lower Leg Wound Status: Open Wounding Event: Gradually Appeared Comorbid Hypertension, Osteoarthritis, Received Chemotherapy, Date Acquired: 04/06/2021 History: Received Radiation Weeks Of Treatment: 11 Clustered Wound: Yes Photos Wound Measurements Length: (cm) 9 Width: (cm) 8 Depth: (cm) 0.2 Clustered Quantity: 2 Area: (cm) 56.549 Volume: (cm) 11.31 % Reduction in Area: 37.4% % Reduction in Volume: 37.4% Epithelialization: Large (67-100%) Tunneling: No Undermining: No Wound Description Classification: Full Thickness Without Exposed Support Structu Exudate Amount: Large Exudate Type: Purulent Exudate Color: yellow, brown, green res Foul Odor After Cleansing: No Slough/Fibrino Yes Wound Bed Granulation Amount: Small (1-33%) Exposed Structure Granulation Quality: Red Fascia Exposed: No Necrotic Amount: Large (67-100%) Fat Layer (Subcutaneous Tissue) Exposed: Yes Necrotic Quality: Adherent Slough Tendon Exposed: No Muscle Exposed: No Joint Exposed: No Bone Exposed: No Assessment Notes Periwound macerated Treatment Notes Wound #1 (Lower Leg) Wound Laterality: Left,  Medial Cleanser Soap and Water JOANNY, DUPREE ([id number]) Discharge Instruction: Gently cleanse wound with antibacterial soap, rinse and pat dry prior to dressing wounds Peri-Wound Care Desitin Maximum Strength Ointment 4 (oz) Discharge Instruction: Apply periwound Topical Gentamicin Discharge Instruction: Apply as directed by Rishabh Rinkenberger. Primary Dressing Cutimed Sorbact 1.5x 2.38 (in/in) Discharge Instruction: A bacteria- and fungi binding wound dressing, suitable for cavities and fistulas. It is suitable as a wound filler and allows the passage of wound exudate into a secondary dressing. The dressing helps reducing odor and pain and can improve healing. Secondary Dressing Xtrasorb Large 6x9 (in/in) Discharge Instruction: Apply to wound as directed. Do not cut. Secured With Compression Wrap Profore Lite LF 3 Multilayer Compression Bandaging System Discharge Instruction: Apply 3 multi-layer wrap as prescribed. Compression Stockings Add-Ons Electronic Signature(s) Signed: 09/21/2021 4:18:42 PM By: Dolan Amen RN Entered By: Dolan Amen on 09/21/2021 10:30:36 Laura Santiago ([id number]) -------------------------------------------------------------------------------- Wound Assessment Details Patient Name: MAKAYLIN, CARLO. Date of Service: 09/21/2021 10:00 AM Medical Record Number: [id number] Patient Account Number: [id number] Date of Birth/Sex: 29-Jun-1946 (75 y.o. F) Treating RN: Dolan Amen Primary Care Khamani Daniely: SYSTEM, PCP Other Clinician: Referring Mikeila Burgen: Randa Evens Treating Pebbles Zeiders/Extender: Yaakov Guthrie in Treatment: 11 Wound Status Wound Number: 2 Primary Neuropathic Ulcer-Non Diabetic Etiology: Wound Location: Right Toe Great Wound Status: Open Wounding Event: Gradually Appeared Comorbid Hypertension, Osteoarthritis, Received Chemotherapy, Date Acquired: 08/24/2021 History: Received Radiation Weeks Of Treatment: 4 Clustered Wound:  No Photos Wound Measurements Length: (cm) 0.3 Width: (cm) 0.4 Depth: (cm) 0.2 Area: (cm) 0.094 Volume: (cm) 0.019 % Reduction in Area: 40.1% % Reduction in Volume: 69.8% Epithelialization: None Tunneling: No Undermining: No Wound Description Classification: Full Thickness Without Exposed Support Structures Wound Margin: Thickened Exudate Amount: Medium Exudate Type: Serous Exudate Color: amber Foul Odor After Cleansing: No Slough/Fibrino Yes Wound Bed Granulation Amount: Large (67-100%) Exposed Structure Granulation Quality: Red Fascia Exposed: No Necrotic Amount: Small (1-33%) Fat Layer (Subcutaneous Tissue) Exposed: Yes Necrotic Quality: Adherent Slough Tendon Exposed: No Muscle Exposed: No Joint Exposed: No Bone Exposed: No Treatment Notes Wound #2 (Toe Great) Wound Laterality: Right Cleanser Wound Cleanser Discharge Instruction: Wash your hands with soap and water. Remove old dressing,  discard into plastic bag and place into trash. Cleanse the wound with Wound Cleanser prior to applying a clean dressing using gauze sponges, not tissues or cotton balls. Do not scrub or use excessive force. Pat dry using gauze sponges, not tissue or cotton balls. VALERIA, BOZA ([id number]) Peri-Wound Care Topical Primary Dressing Silvercel Small 2x2 (in/in) Discharge Instruction: Apply Silvercel Small 2x2 (in/in) as instructed Secondary Dressing Gauze Discharge Instruction: Cover with dry gauze Secured With 21M [place] [employer] Tape, 2x2 (in/yd) Discharge Instruction: Secure dressing Compression Wrap Compression Stockings Add-Ons Electronic Signature(s) Signed: 09/21/2021 4:18:42 PM By: Dolan Amen RN Entered By: Dolan Amen on 09/21/2021 10:32:34 Laura Santiago ([id number]) -------------------------------------------------------------------------------- Vitals Details Patient Name: Laura Santiago. Date of Service: 09/21/2021 10:00  AM Medical Record Number: [id number] Patient Account Number: [id number] Date of Birth/Sex: 10/10/1946 (75 y.o. F) Treating RN: Dolan Amen Primary Care Philippe Gang: SYSTEM, PCP Other Clinician: Referring Deserae Jennings: Randa Evens Treating Haden Suder/Extender: Yaakov Guthrie in Treatment: 11 Vital Signs Time Taken: 10:20 Temperature (F): 98.9 Height (in): 66 Pulse (bpm): 82 Weight (lbs): 153 Respiratory Rate (breaths/min): 18 Body Mass Index (BMI): 24.7 Blood Pressure (mmHg): 124/79 Reference Range: 80 - 120 mg / dl Electronic Signature(s) Signed: 09/21/2021 4:18:42 PM By: Dolan Amen RN Entered By: Dolan Amen on 09/21/2021 10:20:49

## 2021-09-30 ENCOUNTER — Other Ambulatory Visit: Payer: Self-pay

## 2021-09-30 ENCOUNTER — Telehealth: Payer: Self-pay | Admitting: *Deleted

## 2021-09-30 ENCOUNTER — Encounter: Payer: Self-pay | Admitting: Oncology

## 2021-09-30 ENCOUNTER — Inpatient Hospital Stay: Payer: Medicare Other

## 2021-09-30 ENCOUNTER — Inpatient Hospital Stay (HOSPITAL_BASED_OUTPATIENT_CLINIC_OR_DEPARTMENT_OTHER): Payer: Medicare Other | Admitting: Oncology

## 2021-09-30 VITALS — BP 146/71 | HR 78 | Temp 98.6°F | Resp 16 | Wt 162.0 lb

## 2021-09-30 DIAGNOSIS — C50919 Malignant neoplasm of unspecified site of unspecified female breast: Secondary | ICD-10-CM

## 2021-09-30 DIAGNOSIS — Z5111 Encounter for antineoplastic chemotherapy: Secondary | ICD-10-CM

## 2021-09-30 DIAGNOSIS — Z5112 Encounter for antineoplastic immunotherapy: Secondary | ICD-10-CM

## 2021-09-30 DIAGNOSIS — Z95828 Presence of other vascular implants and grafts: Secondary | ICD-10-CM

## 2021-09-30 LAB — CBC WITH DIFFERENTIAL/PLATELET
Abs Immature Granulocytes: 0.03 10*3/uL (ref 0.00–0.07)
Basophils Absolute: 0 10*3/uL (ref 0.0–0.1)
Basophils Relative: 1 %
Eosinophils Absolute: 0.1 10*3/uL (ref 0.0–0.5)
Eosinophils Relative: 2 %
HCT: 31 % — ABNORMAL LOW (ref 36.0–46.0)
Hemoglobin: 9.6 g/dL — ABNORMAL LOW (ref 12.0–15.0)
Immature Granulocytes: 1 %
Lymphocytes Relative: 14 %
Lymphs Abs: 0.9 10*3/uL (ref 0.7–4.0)
MCH: 29.3 pg (ref 26.0–34.0)
MCHC: 31 g/dL (ref 30.0–36.0)
MCV: 94.5 fL (ref 80.0–100.0)
Monocytes Absolute: 0.5 10*3/uL (ref 0.1–1.0)
Monocytes Relative: 9 %
Neutro Abs: 4.5 10*3/uL (ref 1.7–7.7)
Neutrophils Relative %: 73 %
Platelets: 271 10*3/uL (ref 150–400)
RBC: 3.28 MIL/uL — ABNORMAL LOW (ref 3.87–5.11)
RDW: 15.6 % — ABNORMAL HIGH (ref 11.5–15.5)
WBC: 6.1 10*3/uL (ref 4.0–10.5)
nRBC: 0 % (ref 0.0–0.2)

## 2021-09-30 LAB — COMPREHENSIVE METABOLIC PANEL
ALT: 9 U/L (ref 0–44)
AST: 17 U/L (ref 15–41)
Albumin: 3.6 g/dL (ref 3.5–5.0)
Alkaline Phosphatase: 67 U/L (ref 38–126)
Anion gap: 4 — ABNORMAL LOW (ref 5–15)
BUN: 23 mg/dL (ref 8–23)
CO2: 26 mmol/L (ref 22–32)
Calcium: 8.5 mg/dL — ABNORMAL LOW (ref 8.9–10.3)
Chloride: 107 mmol/L (ref 98–111)
Creatinine, Ser: 1.15 mg/dL — ABNORMAL HIGH (ref 0.44–1.00)
GFR, Estimated: 49 mL/min — ABNORMAL LOW (ref >60)
Glucose, Bld: 107 mg/dL — ABNORMAL HIGH (ref 70–99)
Potassium: 4.2 mmol/L (ref 3.5–5.1)
Sodium: 137 mmol/L (ref 135–145)
Total Bilirubin: 0.4 mg/dL (ref 0.3–1.2)
Total Protein: 6.9 g/dL (ref 6.5–8.1)

## 2021-09-30 MED ORDER — DIPHENHYDRAMINE HCL 50 MG/ML IJ SOLN
50.0000 mg | Freq: Once | INTRAMUSCULAR | Status: AC
  Administered 2021-09-30: 50 mg via INTRAVENOUS
  Filled 2021-09-30: qty 1

## 2021-09-30 MED ORDER — FENTANYL 12 MCG/HR TD PT72: MEDICATED_PATCH | TRANSDERMAL | 0 refills | Status: DC

## 2021-09-30 MED ORDER — ACETAMINOPHEN 325 MG PO TABS
650.0000 mg | ORAL_TABLET | Freq: Once | ORAL | Status: AC
  Administered 2021-09-30: 650 mg via ORAL
  Filled 2021-09-30: qty 2

## 2021-09-30 MED ORDER — TRASTUZUMAB-DKST CHEMO 150 MG IV SOLR
6.0000 mg/kg | Freq: Once | INTRAVENOUS | Status: AC
  Administered 2021-09-30: 420 mg via INTRAVENOUS
  Filled 2021-09-30: qty 20

## 2021-09-30 MED ORDER — SODIUM CHLORIDE 0.9 % IV SOLN
65.0000 mg/m2 | Freq: Once | INTRAVENOUS | Status: AC
  Administered 2021-09-30: 114 mg via INTRAVENOUS
  Filled 2021-09-30: qty 19

## 2021-09-30 MED ORDER — SODIUM CHLORIDE 0.9% FLUSH
10.0000 mL | Freq: Once | INTRAVENOUS | Status: DC
  Filled 2021-09-30: qty 10

## 2021-09-30 MED ORDER — SODIUM CHLORIDE 0.9 % IV SOLN
420.0000 mg | Freq: Once | INTRAVENOUS | Status: AC
  Administered 2021-09-30: 420 mg via INTRAVENOUS
  Filled 2021-09-30: qty 14

## 2021-09-30 MED ORDER — ZOLPIDEM TARTRATE 5 MG PO TABS: 5.0000 mg | ORAL_TABLET | Freq: Every day | ORAL | 0 refills | Status: DC

## 2021-09-30 MED ORDER — SODIUM CHLORIDE 0.9 % IV SOLN
Freq: Once | INTRAVENOUS | Status: AC
  Filled 2021-09-30: qty 250

## 2021-09-30 MED ORDER — SODIUM CHLORIDE 0.9 % IV SOLN
10.0000 mg | Freq: Once | INTRAVENOUS | Status: AC
  Administered 2021-09-30: 10 mg via INTRAVENOUS
  Filled 2021-09-30: qty 10

## 2021-09-30 MED ORDER — HEPARIN SOD (PORK) LOCK FLUSH 100 UNIT/ML IV SOLN
500.0000 [IU] | Freq: Once | INTRAVENOUS | Status: AC | PRN
  Filled 2021-09-30: qty 5

## 2021-09-30 MED ORDER — FAMOTIDINE 20 MG IN NS 100 ML IVPB
20.0000 mg | Freq: Once | INTRAVENOUS | Status: AC
  Administered 2021-09-30: 20 mg via INTRAVENOUS
  Filled 2021-09-30: qty 20

## 2021-09-30 MED ORDER — HEPARIN SOD (PORK) LOCK FLUSH 100 UNIT/ML IV SOLN
INTRAVENOUS | Status: AC
  Administered 2021-09-30: 500 [IU]
  Filled 2021-09-30: qty 5

## 2021-09-30 NOTE — Progress Notes (Signed)
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  She had a screening mammogram in September 2014 which showed 2.1 x 2.3 x 1.8 cm irregular mass in her right breast.  It was ER 10% positive PR negative and HER2 positive +3.  She received neoadjuvant chemotherapy with Taxol Herceptin and Perjeta for 4 cycles followed by dose dense AC/Herceptin x4 which she completed in March 2015.  She had a right lumpectomy on 04/03/2014 which showed scant residual invasive ductal carcinoma [id number].  She completed 1 year of adjuvant Herceptin chemotherapy and also completed adjuvant radiation treatment.  She was recommended anastrozole which she took on and off starting November 2015 and stopped sometime in 2020.   She was then hospitalized with neck pain and was found to have lytic lesions involving C5-C6 with pathological vertebral fractures.  She underwent radiation treatment to this area.  Image guided biopsy of the L1 vertebral body showed metastatic carcinoma consistent with breast origin ER 10% PR 0% and HER2 amplified ratio 5.1 average HER2 signal number per cell 15.0 average CEP 17 signals number per cell 3.0.  Baseline echocardiogram on 05/02/2021 showed a normal EF of 62% she was recommended Taxol Herceptin and Perjeta which she received for 2 cycles at [facility] until June 10, 2021   She has chronic pain from her bone metastases for which she is currently on oxycodone 10 mg every 4 hours as needed and 12 mcg fentanyl patch.  She was seeing pain clinic when she was living in [name].   Patient has also been on Zometa when she was in [name] but she does have some ongoing dental issues.  She has received Xgeva in the past as well.  Her last Delton See was in July 2021.  Last PET scan was on 03/21/2021 which showed diffuse osseous metastatic disease involving the head neck chest abdomen and pelvis and spine.  Left lung apex hypermetabolic nodule and multiple hypermetabolic liver lesions concerning for disease involvement.  Enlarged hypermetabolic left inguinal lymph nodes along with hypermetabolic external iliac and left supraclavicular lymph nodes   Patient is now moved to [place] to be close to her daughter.  She lives in an independent living.    Interval history-patient is tolerating treatment well and would like to get her weekly Taxol 3 weeks continuously instead of 2 weeks on and 1 week off.  Patient states that she feels better when she gets Taxol than when she does not.  Does not feel that her neuropathy is any worse at this time.  ECOG PS- 1 Pain scale- 0   Review of systems- Review of Systems  Constitutional:  Positive for malaise/fatigue. Negative for chills, fever and weight loss.  HENT:  Negative for congestion, ear discharge and nosebleeds.   Eyes:  Negative for blurred vision.  Respiratory:  Negative for cough, hemoptysis, sputum production, shortness of breath and wheezing.  Cardiovascular:  Negative for chest pain, palpitations, orthopnea and claudication.  Gastrointestinal:  Negative for abdominal pain, blood in stool, constipation, diarrhea, heartburn, melena, nausea and vomiting.  Genitourinary:  Negative for dysuria, flank pain, frequency, hematuria and urgency.  Musculoskeletal:  Negative for back pain, joint pain and myalgias.  Skin:  Negative for  rash.  Neurological:  Negative for dizziness, tingling, focal weakness, seizures, weakness and headaches.  Endo/Heme/Allergies:  Does not bruise/bleed easily.  Psychiatric/Behavioral:  Negative for depression and suicidal ideas. The patient does not have insomnia.      Allergies  Allergen Reactions   Corticosteroids Other (See Comments)    Pt trf from [name] and per primary md for her cancer tx. Notes that it causes agitation intolerance   Sulfa Antibiotics Other (See Comments)    Pt moved from [name] and in MD notes she has allergy but we do not know reactions when taking the drug   Celebrex [Celecoxib] Rash     Past Medical History:  Diagnosis Date   ADHD (attention deficit hyperactivity disorder)    in [place], no date on md note   Anemia    IDA 11/26/2019, Anemia in stage 4 chronic kidney disease ([place]) 09/03/2020   Arthritis    osteoarthritis right knee 09/30/2014   Breast cancer ([place]) 10/05/2013   in [place] +, [place] -, Her 2 is 3+   Cancer related pain 03/28/2021   in [name], md notes spine mets   DVT of lower extremity, bilateral ([place]) 03/30/2014   in [name]   Generalized muscle weakness 03/31/2016   in [name]   GERD (gastroesophageal reflux disease) 05/15/2013   per md in [name]   Hyperparathyroidism, secondary ([place]) 04/25/2018   in [name]   Hypertension 02/20/2016   info from MD in [facility]   Lumbar compression fracture ([place]) 03/18/2021   in [place] loss 02/05/2015   in [name]   Metabolic acidosis 76/81/1572   in [place]   Metabolic syndrome 62/02/5596   in [name]   Osteopenia after menopause 03/29/2016   in [place] of both eyes 09/30/2012   per md in [name] where pt. 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   in [name]   Stasis ulcer of left lower extremity ([place]) 03/29/2016   in [name]     Past Surgical History:  Procedure Laterality Date   CESAREAN SECTION     unknown   fibroid removed  N/A    in [place] - unknown date   IR FLUORO GUIDE CV LINE  LEFT  07/27/2021   IR PORT REPAIR CENTRAL VENOUS ACCESS DEVICE Left    In [place] N/A 02/25/2014   in [name]   ovary removed      unknown   [employer] CATARACT EXTRACAP,INSERT LENS Bilateral  Bilateral 09/05/2012   in [place]     unknown    LUMPECTOMY Right 04/03/2014   in [place]    Social History   Socioeconomic History   Marital status: Divorced    Spouse name: Not on file   Number of children: Not on file   Years of education: Not on file   Highest education level: Not on file  Occupational History   Occupation: retired Teacher, music    Comment: In [place]  Tobacco Use   Smoking status: Never   Smokeless tobacco: Never  Vaping Use   Vaping Use: Never used  Substance and Sexual  Activity   Alcohol use: Not Currently   Drug use: Never   Sexual activity: Not Currently  Other Topics Concern   Not on file  Social History Narrative   Not on file   Social Determinants of Health   Financial Resource Strain: Not on file  Food Insecurity: Not on file  Transportation Needs: Not on file  Physical Activity: Not on file  Stress: Not on file  Social Connections: Not on file  Intimate Partner Violence: Not on file    Family History  Problem Relation Age of Onset   Pancreatic cancer Mother    Stroke Father    Diabetes Father    Hypertension Father    Heart disease Father    Skin cancer Father    Varicose Veins Father    Skin cancer Brother    Cancer - Prostate Brother      Current Outpatient Medications:    amphetamine-dextroamphetamine (ADDERALL XR) 30 MG 24 hr capsule, Take 1 capsule (30 mg total) by mouth daily., Disp: 30 capsule, Rfl: 0   Calcium 200 MG TABS, Take 1 tablet by mouth daily., Disp: , Rfl:    cephALEXin (KEFLEX) 500 MG capsule, Take 500 mg by mouth 2 (two) times daily. (Patient not taking: Reported on 09/09/2021), Disp: , Rfl:    gabapentin (NEURONTIN) 400 MG capsule, Take 400 mg by mouth 2 (two) times daily., Disp: ,  Rfl:    lisinopril (ZESTRIL) 20 MG tablet, Take 20 mg by mouth daily., Disp: , Rfl:    mirtazapine (REMERON) 15 MG tablet, Take 1 tablet (15 mg total) by mouth at bedtime., Disp: 30 tablet, Rfl: 0   Multiple Vitamin (MULTIVITAMIN ADULT PO), Take 1 tablet by mouth daily. (Patient not taking: No sig reported), Disp: , Rfl:    ondansetron (ZOFRAN) 8 MG tablet, Take by mouth., Disp: , Rfl:    Oxycodone HCl 10 MG TABS, Take 1 tablet (10 mg total) by mouth every 4 (four) hours as needed., Disp: 84 tablet, Rfl: 0   potassium chloride SA (KLOR-CON) 20 MEQ tablet, Take 1 tablet (20 mEq total) by mouth daily., Disp: 7 tablet, Rfl: 0   rivaroxaban (XARELTO) 20 MG TABS tablet, Take 1 tablet (20 mg total) by mouth daily with supper., Disp: 30 tablet, Rfl: 3   terbinafine (LAMISIL AT) 1 % cream, Apply 1 application topically 2 (two) times daily., Disp: 30 g, Rfl: 5  Physical exam:  Vitals:   09/30/21 0849  BP: (!) 146/71  Pulse: 78  Resp: 16  Temp: 98.6 F (37 C)  SpO2: 98%  Weight: 162 lb (73.5 kg)   Physical Exam Constitutional:      General: She is not in acute distress. Cardiovascular:     Rate and Rhythm: Normal rate and regular rhythm.     Heart sounds: Normal heart sounds.  Pulmonary:     Effort: Pulmonary effort is normal.     Breath sounds: Normal breath sounds.  Abdominal:     General: Bowel sounds are normal.     Palpations: Abdomen is soft.  Skin:    General: Skin is warm and dry.  Neurological:     Mental Status: She is alert and oriented to person, place, and time.     CMP Latest Ref Rng & Units 09/16/2021  Glucose 70 - 99 mg/dL 103(H)  BUN 8 - 23 mg/dL 20  Creatinine 0.44 - 1.00 mg/dL 1.37(H)  Sodium 135 - 145 mmol/L 138  Potassium 3.5 - 5.1 mmol/L  4.0  Chloride 98 - 111 mmol/L 107  CO2 22 - 32 mmol/L 25  Calcium 8.9 - 10.3 mg/dL 8.4(L)  Total Protein 6.5 - 8.1 g/dL 6.8  Total Bilirubin 0.3 - 1.2 mg/dL 0.4  Alkaline Phos 38 - 126 U/L 67  AST 15 - 41 U/L 20  ALT 0 -  44 U/L 14   CBC Latest Ref Rng & Units 09/16/2021  WBC 4.0 - 10.5 K/uL 6.0  Hemoglobin 12.0 - 15.0 g/dL 9.1(L)  Hematocrit 36.0 - 46.0 % 29.9(L)  Platelets 150 - 400 K/uL 275    No images are attached to the encounter.  US Venous Img Lower Unilateral Right  Result Date: 09/05/2021 CLINICAL DATA:  pt has wound on right leg and toe has a sore, pt has breast cancer metastatic EXAM: RIGHT LOWER EXTREMITY VENOUS DOPPLER ULTRASOUND TECHNIQUE: Gray-scale sonography with graded compression, as well as color Doppler and duplex ultrasound were performed to evaluate the lower extremity deep venous systems from the level of the common femoral vein and including the common femoral, femoral, profunda femoral, popliteal and calf veins including the posterior tibial, peroneal and gastrocnemius veins when visible. The superficial great saphenous vein was also interrogated. Spectral Doppler was utilized to evaluate flow at rest and with distal augmentation maneuvers in the common femoral, femoral and popliteal veins. COMPARISON:  None. FINDINGS: Contralateral Common Femoral Vein: Respiratory phasicity is normal and symmetric with the symptomatic side. No evidence of thrombus. Normal compressibility. Common Femoral Vein: No evidence of thrombus. Normal compressibility, respiratory phasicity and response to augmentation. Saphenofemoral Junction: No evidence of thrombus. Normal compressibility and flow on color Doppler imaging. Profunda Femoral Vein: No evidence of thrombus. Normal compressibility and flow on color Doppler imaging. Femoral Vein: No evidence of thrombus. Normal compressibility, respiratory phasicity and response to augmentation. Popliteal Vein: No evidence of thrombus. Normal compressibility, respiratory phasicity and response to augmentation. Calf Veins: No evidence of thrombus. Normal compressibility and flow on color Doppler imaging. Other Findings:  None. IMPRESSION: No evidence of deep venous thrombosis.  Electronically Signed   By: Albin Felling M.D.   On: 09/05/2021 14:51   DG Humerus Right  Result Date: 09/06/2021 CLINICAL DATA:  History of metastatic breast cancer, abnormal bone scan EXAM: RIGHT HUMERUS - 2+ VIEW COMPARISON:  08/04/2021 FINDINGS: Frontal and lateral views of the right humerus are obtained. There are no acute displaced fractures. There is a subtle cortical lucency along the proximal humeral metadiaphyseal junction corresponding to the bone scan findings of metastatic disease. No evidence of impending fracture. Severe osteoarthritis of the right shoulder. IMPRESSION: 1. Subtle cortical lucency proximal humeral metadiaphysis, consistent with known bony metastatic disease. No evidence of impending or pathologic fracture. Electronically Signed   By: Randa Ngo M.D.   On: 09/06/2021 22:39     Assessment and plan- Patient is a 75 y.o. female with metastatic weakly ER positive HER2 positive breast cancer with liver lymph node and bone metastases.  She is here for on treatment assessment prior to cycle 5-day 1 of Taxol Herceptin and Perjeta  Counts okay to proceed with cycle 5-day 1 of Taxol Herceptin and Perjeta today.Typically Taxol is given 3 weeks weekly continuously.  I had switched her to 2 weeks on 1 week off to prevent any worsening of neuropathy but patient insists on getting it 3 weekly cycles and therefore she will proceed with Taxol next week and the week after as well.  I will see her back in 3 weeks with port  labs CBC with differential, CMP and here she will receive Taxol Herceptin and Perjeta on that day as well as Zometa.  Neoplasm related pain: Secondary to bone metastases.  She is on as needed oxycodone which she will continue.   We will plan to get a repeat CT scans early December.  I will also order an echocardiogram next time when I see her in   Visit Diagnosis 1. Encounter for monoclonal antibody treatment for malignancy   2. Encounter for antineoplastic  chemotherapy   3. Metastatic breast cancer [facility])      Dr. Randa Evens, MD, MPH [facility] at [facility] [phone] 09/30/2021 7:14 AM

## 2021-09-30 NOTE — Telephone Encounter (Signed)
Pharmacy called stating that the directions for the Fentanyl patch prescription sent are incomplete. There is no frequency of administering. Please resubmit prescription.

## 2021-09-30 NOTE — Progress Notes (Signed)
Pt states REMERON makes her suicidal medication was changed to [place] and has been helping her sleep. Increased pain, along with several GI issues. Also, lisinopril dose was decreased to 20mg .

## 2021-09-30 NOTE — Patient Instructions (Signed)
[facility] ONCOL[facility]  Discharge Instructions: Thank you for choosing [facility] to provide your oncology and hematology care.  If you have a lab appointment with the [facility], please go directly to the [facility] and check in at the registration area.  Wear comfortable clothing and clothing appropriate for easy access to any Portacath or PICC line.   We strive to give you quality time with your provider. You may need to reschedule your appointment if you arrive late (15 or more minutes).  Arriving late affects you and other patients whose appointments are after yours.  Also, if you miss three or more appointments without notifying the office, you may be dismissed from the clinic at the provider's discretion.      For prescription refill requests, have your pharmacy contact our office and allow 72 hours for refills to be completed.    Today you received the following chemotherapy and/or immunotherapy agents Ogivri, Perjeta, Taxol       To help prevent nausea and vomiting after your treatment, we encourage you to take your nausea medication as directed.  BELOW ARE SYMPTOMS THAT SHOULD BE REPORTED IMMEDIATELY: *FEVER GREATER THAN 100.4 F (38 C) OR HIGHER *CHILLS OR SWEATING *NAUSEA AND VOMITING THAT IS NOT CONTROLLED WITH YOUR NAUSEA MEDICATION *UNUSUAL SHORTNESS OF BREATH *UNUSUAL BRUISING OR BLEEDING *URINARY PROBLEMS (pain or burning when urinating, or frequent urination) *BOWEL PROBLEMS (unusual diarrhea, constipation, pain near the anus) TENDERNESS IN MOUTH AND THROAT WITH OR WITHOUT PRESENCE OF ULCERS (sore throat, sores in mouth, or a toothache) UNUSUAL RASH, SWELLING OR PAIN  UNUSUAL VAGINAL DISCHARGE OR ITCHING   Items with * indicate a potential emergency and should be followed up as soon as possible or go to the Emergency Department if any problems should occur.  Please show the CHEMOTHERAPY ALERT CARD or IMMUNOTHERAPY ALERT CARD  at check-in to the Emergency Department and triage nurse.  Should you have questions after your visit or need to cancel or reschedule your appointment, please contact [facility]  [phone] and follow the prompts.  Office hours are 8:00 a.m. to 4:30 p.m. Monday - Friday. Please note that voicemails left after 4:00 p.m. may not be returned until the following business day.  We are closed weekends and major holidays. You have access to a nurse at all times for urgent questions. Please call the main number to the clinic [phone] and follow the prompts.  For any non-urgent questions, you may also contact your provider using MyChart. We now offer e-Visits for anyone 48 and older to request care online for non-urgent symptoms. For details visit [email or web address].   Also download the MyChart app! Go to the app store, search "MyChart", open the app, select [facility], and log in with your MyChart username and password.  Due to Covid, a mask is required upon entering the hospital/clinic. If you do not have a mask, one will be given to you upon arrival. For doctor visits, patients may have 1 support person aged 62 or older with them. For treatment visits, patients cannot have anyone with them due to current Covid guidelines and our immunocompromised population.

## 2021-10-03 ENCOUNTER — Encounter: Payer: Self-pay | Admitting: Oncology

## 2021-10-03 ENCOUNTER — Other Ambulatory Visit: Payer: Self-pay | Admitting: Hospice and Palliative Medicine

## 2021-10-03 MED ORDER — FENTANYL 12 MCG/HR TD PT72: 1.0000 | MEDICATED_PATCH | TRANSDERMAL | 0 refills | Status: DC

## 2021-10-03 NOTE — Progress Notes (Signed)
New Rx sent to pharmacy to clarify sig

## 2021-10-05 ENCOUNTER — Other Ambulatory Visit: Payer: Self-pay

## 2021-10-05 ENCOUNTER — Encounter (HOSPITAL_BASED_OUTPATIENT_CLINIC_OR_DEPARTMENT_OTHER): Payer: Medicare Other | Admitting: Internal Medicine

## 2021-10-05 DIAGNOSIS — S91101D Unspecified open wound of right great toe without damage to nail, subsequent encounter: Secondary | ICD-10-CM | POA: Diagnosis not present

## 2021-10-05 DIAGNOSIS — S81802D Unspecified open wound, left lower leg, subsequent encounter: Secondary | ICD-10-CM

## 2021-10-05 DIAGNOSIS — S81802A Unspecified open wound, left lower leg, initial encounter: Secondary | ICD-10-CM | POA: Diagnosis not present

## 2021-10-05 NOTE — Progress Notes (Signed)
Laura Santiago, Laura Santiago ([id number]) Visit Report for 10/05/2021 Chief Complaint Document Details Patient Name: Laura Santiago, Laura Santiago. Date of Service: 10/05/2021 11:30 AM Medical Record Number: [id number] Patient Account Number: [id number] Date of Birth/Sex: 1946-10-11 (75 y.o. F) Treating RN: Donnamarie Poag Primary Care Provider: SYSTEM, PCP Other Clinician: Referring Provider: Randa Evens Treating Provider/Extender: Yaakov Guthrie in Treatment: 13 Information Obtained from: Patient Chief Complaint Left lower extremity wound Right toe wounds Electronic Signature(s) Signed: 10/05/2021 1:56:35 PM By: Kalman Shan DO Entered By: Kalman Shan on 10/05/2021 12:19:57 Laura Santiago ([id number]) -------------------------------------------------------------------------------- Debridement Details Patient Name: Laura Santiago. Date of Service: 10/05/2021 11:30 AM Medical Record Number: [id number] Patient Account Number: [id number] Date of Birth/Sex: [birth year] (75 y.o. F) Treating RN: Donnamarie Poag Primary Care Provider: SYSTEM, PCP Other Clinician: Referring Provider: Randa Evens Treating Provider/Extender: Yaakov Guthrie in Treatment: 13 Debridement Performed for Wound #2 Right Toe Great Assessment: Performed By: Physician Kalman Shan, MD Debridement Type: Debridement Level of Consciousness (Pre- Awake and Alert procedure): Pre-procedure Verification/Time Out Yes - 12:02 Taken: Start Time: 12:02 Pain Control: Lidocaine Total Area Debrided (L x W): 0.2 (cm) x 0.2 (cm) = 0.04 (cm) Tissue and other material Viable, Non-Viable, Slough, Subcutaneous, Slough debrided: Level: Skin/Subcutaneous Tissue Debridement Description: Excisional Instrument: Curette Bleeding: Minimum Hemostasis Achieved: Pressure Response to Treatment: Procedure was tolerated well Level of Consciousness (Post- Awake and Alert procedure): Post Debridement Measurements of Total  Wound Length: (cm) 0.2 Width: (cm) 0.2 Depth: (cm) 0.2 Volume: (cm) 0.006 Character of Wound/Ulcer Post Debridement: Improved Post Procedure Diagnosis Same as Pre-procedure Electronic Signature(s) Signed: 10/05/2021 12:29:48 PM By: Donnamarie Poag Signed: 10/05/2021 1:56:35 PM By: Kalman Shan DO Entered By: Donnamarie Poag on 10/05/2021 12:13:04 Laura Santiago ([id number]) -------------------------------------------------------------------------------- Debridement Details Patient Name: Laura Santiago. Date of Service: 10/05/2021 11:30 AM Medical Record Number: [id number] Patient Account Number: [id number] Date of Birth/Sex: 09-28-1946 (75 y.o. F) Treating RN: Donnamarie Poag Primary Care Provider: SYSTEM, PCP Other Clinician: Referring Provider: Randa Evens Treating Provider/Extender: Yaakov Guthrie in Treatment: 13 Debridement Performed for Wound #1 Left,Medial Lower Leg Assessment: Performed By: Physician Kalman Shan, MD Debridement Type: Debridement Severity of Tissue Pre Debridement: Fat layer exposed Level of Consciousness (Pre- Awake and Alert procedure): Pre-procedure Verification/Time Out Yes - 12:02 Taken: Start Time: 12:04 Pain Control: Lidocaine Total Area Debrided (L x W): 6 (cm) x 5 (cm) = 30 (cm) Tissue and other material Viable, Non-Viable, Slough, Subcutaneous, Slough debrided: Level: Skin/Subcutaneous Tissue Debridement Description: Excisional Instrument: Curette Bleeding: Minimum Hemostasis Achieved: Pressure Response to Treatment: Procedure was tolerated well Level of Consciousness (Post- Awake and Alert procedure): Post Debridement Measurements of Total Wound Length: (cm) 9.2 Width: (cm) 8 Depth: (cm) 0.2 Volume: (cm) 11.561 Character of Wound/Ulcer Post Debridement: Improved Severity of Tissue Post Debridement: Fat layer exposed Post Procedure Diagnosis Same as Pre-procedure Electronic Signature(s) Signed: 10/05/2021  12:29:48 PM By: Donnamarie Poag Signed: 10/05/2021 1:56:35 PM By: Kalman Shan DO Entered By: Donnamarie Poag on 10/05/2021 12:14:05 Laura Santiago ([id number]) -------------------------------------------------------------------------------- HPI Details Patient Name: Laura Santiago. Date of Service: 10/05/2021 11:30 AM Medical Record Number: [id number] Patient Account Number: [id number] Date of Birth/Sex: 1946-02-02 (75 y.o. F) Treating RN: Donnamarie Poag Primary Care Provider: SYSTEM, PCP Other Clinician: Referring Provider: Randa Evens Treating Provider/Extender: Yaakov Guthrie in Treatment: 13 History of Present Illness HPI Description: Admission 7/27 Ms. Leighton Luster is a 75 year old female with a past medical history of ADHD, metastatic breast cancer, stage IV chronic kidney disease, history of DVT  on Xarelto and chronic venous insufficiency that presents to the clinic for a chronic left lower extremity wound. She recently moved to [facility] 4 days ago. She was being followed by wound care center in [name]. She reports a 10-year history of wounds to her left lower extremity that eventually do heal with debridement and compression therapy. She states that the current wound reopened 4 months ago and she is using Vaseline and Coban. She denies signs of infection. 8/3; patient presents for 1 week follow-up. She reports no issues or complaints today. She states she had vascular studies done in the last week. She denies signs of infection. She brought her little service dog with her today. 8/17; patient presents for follow-up. She has missed her last clinic appointment. She states she took the wrap off and attempted to rewrap her leg. She is having difficulty with transportation. She has her service dog with her today. Overall she feels well and reports improvement in wound healing. She denies signs of infection. She reports owning an old Velcro wrap compression and has  this at her living facility 9/14; patient presents for follow-up. Patient states that over the past 2 to 3 weeks she developed toe wounds to her right foot. She attributes this to tight fitting shoes. She subsequently developed cellulitis in the right leg and has been treated by doxycycline by her oncologist. She reports improvement in symptoms however continues to have some redness and swelling to this leg. To the left lower extremity patient has been having her wraps changed with home health twice weekly. She states that the [facility] is not helping control the drainage. Other than that she has no issues or complaints today. She denies signs of infection to the left lower extremity. 9/21; patient presents for follow-up. She reports seeing infectious disease for her cellulitis. She reports no further management. She has home health that changes the wraps twice weekly. She has no issues or complaints today. She denies signs of infection. 10/5; patient presents for follow-up. She has no issues or complaints today. She denies signs of infection. She states that the right great toe has not been dressed by home health. 10/12; patient presents for follow-up. She has no issues or complaints today. She reports improvement in her wound healing. She has been using silver alginate to the right great toe wound. She denies signs of infection. 10/26; patient presents for follow-up. Home health did not have sorbact so they continued to use Hydrofera Blue under the wrap. She has been using silver alginate to the great toe wound however she did not have a dressing in place today. She currently denies signs of infection. Electronic Signature(s) Signed: 10/05/2021 1:56:35 PM By: Kalman Shan DO Entered By: Kalman Shan on 10/05/2021 12:20:49 Laura Santiago ([id number]) -------------------------------------------------------------------------------- Physical Exam Details Patient Name: Laura Santiago, Laura Santiago. Date of Service: 10/05/2021 11:30 AM Medical Record Number: [id number] Patient Account Number: [id number] Date of Birth/Sex: 1945-12-29 (75 y.o. F) Treating RN: Donnamarie Poag Primary Care Provider: SYSTEM, PCP Other Clinician: Referring Provider: Randa Evens Treating Provider/Extender: Yaakov Guthrie in Treatment: 13 Constitutional . Cardiovascular . Psychiatric . Notes Left lower extremity: Large open wound to the medial aspect with nonviable tissue and granulation tissue present. Right foot: To the right great toe there is an open wound with circumferential callus and granulation tissue present post debridement. No signs of infection on exam Electronic Signature(s) Signed: 10/05/2021 1:56:35 PM By: Kalman Shan DO Entered By: Kalman Shan on 10/05/2021 12:32:35  Laura Santiago, Laura Santiago ([id number]) -------------------------------------------------------------------------------- Physician Orders Details Patient Name: AUDIA, AMICK. Date of Service: 10/05/2021 11:30 AM Medical Record Number: [id number] Patient Account Number: [id number] Date of Birth/Sex: Sep 23, 1946 (75 y.o. F) Treating RN: Donnamarie Poag Primary Care Provider: SYSTEM, PCP Other Clinician: Referring Provider: Randa Evens Treating Provider/Extender: Yaakov Guthrie in Treatment: 1 Verbal / Phone Orders: No Diagnosis Coding Follow-up Appointments o Return Appointment in 1 week. o Nurse Visit as needed [place] for wound care. May utilize formulary equivalent dressing for wound treatment orders unless otherwise specified. Home Health Nurse may visit PRN to address patientos wound care needs. o Scheduled days for dressing changes to be completed; exception, patient has scheduled wound care visit that day. - Twice a week, patient comes to office on Wednesday o **Please direct any NON-WOUND related issues/requests for  orders to patient's Primary Care Physician. **If current dressing causes regression in wound condition, may D/C ordered dressing product/s and apply Normal Saline Moist Dressing daily until next [place] or Other MD appointment. **Notify [facility] of regression in wound condition at [phone]. Bathing/ Shower/ Hygiene o May shower with wound dressing protected with water repellent cover or cast protector. o No tub bath. Wound Treatment Wound #1 - Lower Leg Wound Laterality: Left, Medial Cleanser: Soap and Water 3 x Per Week/30 Days Discharge Instructions: Gently cleanse wound with antibacterial soap, rinse and pat dry prior to dressing wounds Peri-Wound Care: Desitin Maximum Strength Ointment 4 (oz) 3 x Per Week/30 Days Discharge Instructions: Apply periwound Topical: Gentamicin 3 x Per Week/30 Days Discharge Instructions: Apply as directed by provider. Primary Dressing: Cutimed Sorbact 1.5x 2.38 (in/in) 3 x Per Week/30 Days Discharge Instructions: A bacteria- and fungi binding wound dressing, suitable for cavities and fistulas. It is suitable as a wound filler and allows the passage of wound exudate into a secondary dressing. The dressing helps reducing odor and pain and can improve healing. Secondary Dressing: Xtrasorb Large 6x9 (in/in) 3 x Per Week/30 Days Discharge Instructions: Apply to wound as directed. Do not cut. Compression Wrap: Profore Lite LF 3 Multilayer Compression Bandaging System 3 x Per Week/30 Days Discharge Instructions: Apply 3 multi-layer wrap as prescribed. Wound #2 - Toe Great Wound Laterality: Right Cleanser: Wound Cleanser 1 x Per Day/30 Days Discharge Instructions: Wash your hands with soap and water. Remove old dressing, discard into plastic bag and place into trash. Cleanse the wound with Wound Cleanser prior to applying a clean dressing using gauze sponges, not tissues or cotton balls. Do not scrub or use excessive force. Pat dry  using gauze sponges, not tissue or cotton balls. Primary Dressing: Silvercel Small 2x2 (in/in) 1 x Per Day/30 Days Discharge Instructions: Apply Silvercel Small 2x2 (in/in) as instructed Secondary Dressing: Gauze 1 x Per Day/30 Days Discharge Instructions: Cover with dry gauze Secured With: 8M Medipore H Soft Cloth Surgical Tape, 2x2 (in/yd) 1 x Per Day/30 Days Discharge Instructions: Secure dressing Laura Santiago, Laura Santiago ([id number]) Electronic Signature(s) Signed: 10/05/2021 12:29:48 PM By: Donnamarie Poag Signed: 10/05/2021 1:56:35 PM By: Kalman Shan DO Entered By: Donnamarie Poag on 10/05/2021 12:17:13 Laura Santiago ([id number]) -------------------------------------------------------------------------------- Problem List Details Patient Name: Laura Santiago, Laura Santiago. Date of Service: 10/05/2021 11:30 AM Medical Record Number: [id number] Patient Account Number: [id number] Date of Birth/Sex: 04/19/1946 (75 y.o. F) Treating RN: Donnamarie Poag Primary Care Provider: SYSTEM, PCP Other Clinician: Referring Provider: Randa Evens Treating Provider/Extender: Yaakov Guthrie in Treatment: 13 Active  Problems ICD-10 Encounter Code Description Active Date MDM Diagnosis S81.802D Unspecified open wound, left lower leg, subsequent encounter 07/13/2021 No Yes S91.101D Unspecified open wound of right great toe without damage to nail, 08/31/2021 No Yes subsequent encounter I87.2 Venous insufficiency (chronic) (peripheral) 07/06/2021 No Yes 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 07/06/2021 No Yes I10 Essential (primary) hypertension 07/06/2021 No Yes Z79.01 Long term (current) use of anticoagulants 07/06/2021 No Yes Inactive Problems ICD-10 Code Description Active Date Inactive Date S81.802A Unspecified open wound, left lower leg, initial encounter 07/06/2021 07/06/2021 S91.101A Unspecified open wound of right great toe without damage to nail, initial 08/24/2021 08/24/2021 encounter S91.104A  Unspecified open wound of right lesser toe(s) without damage to nail, initial 08/24/2021 08/24/2021 encounter Resolved Problems ICD-10 Code Description Active Date Resolved Date S91.104D Unspecified open wound of right lesser toe(s) without damage to nail, 08/31/2021 08/31/2021 subsequent encounter STEPHANINE, REAS ([id number]) Electronic Signature(s) Signed: 10/05/2021 1:56:35 PM By: Kalman Shan DO Entered By: Kalman Shan on 10/05/2021 12:19:01 Laura Santiago ([id number]) -------------------------------------------------------------------------------- Progress Note Details Patient Name: Laura Santiago. Date of Service: 10/05/2021 11:30 AM Medical Record Number: [id number] Patient Account Number: [id number] Date of Birth/Sex: [birth year] (75 y.o. F) Treating RN: Donnamarie Poag Primary Care Provider: SYSTEM, PCP Other Clinician: Referring Provider: Randa Evens Treating Provider/Extender: Yaakov Guthrie in Treatment: 13 Subjective Chief Complaint Information obtained from Patient Left lower extremity wound Right toe wounds History of Present Illness (HPI) Admission 7/27 Ms. Demetri Kerman is a 75 year old female with a past medical history of ADHD, metastatic breast cancer, stage IV chronic kidney disease, history of DVT on Xarelto and chronic venous insufficiency that presents to the clinic for a chronic left lower extremity wound. She recently moved to [facility] 4 days ago. She was being followed by wound care center in [name]. She reports a 10-year history of wounds to her left lower extremity that eventually do heal with debridement and compression therapy. She states that the current wound reopened 4 months ago and she is using Vaseline and Coban. She denies signs of infection. 8/3; patient presents for 1 week follow-up. She reports no issues or complaints today. She states she had vascular studies done in the last week. She denies signs of  infection. She brought her little service dog with her today. 8/17; patient presents for follow-up. She has missed her last clinic appointment. She states she took the wrap off and attempted to rewrap her leg. She is having difficulty with transportation. She has her service dog with her today. Overall she feels well and reports improvement in wound healing. She denies signs of infection. She reports owning an old Velcro wrap compression and has this at her living facility 9/14; patient presents for follow-up. Patient states that over the past 2 to 3 weeks she developed toe wounds to her right foot. She attributes this to tight fitting shoes. She subsequently developed cellulitis in the right leg and has been treated by doxycycline by her oncologist. She reports improvement in symptoms however continues to have some redness and swelling to this leg. To the left lower extremity patient has been having her wraps changed with home health twice weekly. She states that the [facility] is not helping control the drainage. Other than that she has no issues or complaints today. She denies signs of infection to the left lower extremity. 9/21; patient presents for follow-up. She reports seeing infectious disease for her cellulitis. She reports no further management. She has home health that changes  the wraps twice weekly. She has no issues or complaints today. She denies signs of infection. 10/5; patient presents for follow-up. She has no issues or complaints today. She denies signs of infection. She states that the right great toe has not been dressed by home health. 10/12; patient presents for follow-up. She has no issues or complaints today. She reports improvement in her wound healing. She has been using silver alginate to the right great toe wound. She denies signs of infection. 10/26; patient presents for follow-up. Home health did not have sorbact so they continued to use Hydrofera Blue under the  wrap. She has been using silver alginate to the great toe wound however she did not have a dressing in place today. She currently denies signs of infection. Patient History Information obtained from Patient. Social History Never smoker. Medical History Eyes Denies history of Cataracts, Glaucoma, Optic Neuritis Ear/Nose/Mouth/Throat Denies history of Chronic sinus problems/congestion, Middle ear problems Hematologic/Lymphatic Denies history of Anemia, Hemophilia, Human Immunodeficiency Virus, Lymphedema, Sickle Cell Disease Respiratory Denies history of Aspiration, Asthma, Chronic Obstructive Pulmonary Disease (COPD), Pneumothorax, Sleep Apnea, Tuberculosis Cardiovascular Patient has history of Hypertension Denies history of Angina, Arrhythmia, Congestive Heart Failure, Coronary Artery Disease, Deep Vein Thrombosis, Hypotension, Myocardial Infarction, Peripheral Arterial Disease, Peripheral Venous Disease, Phlebitis, Vasculitis Gastrointestinal Denies history of Cirrhosis , Colitis, Crohn s, Hepatitis A, Hepatitis B, Hepatitis C Endocrine Denies history of Type I Diabetes, Type II Diabetes Genitourinary Denies history of End Stage Renal Disease Immunological Denies history of Lupus Erythematosus, Raynaud s, Scleroderma Laura Santiago, Laura Santiago ([id number]) Integumentary (Skin) Denies history of History of Burn, History of pressure wounds Musculoskeletal Patient has history of Osteoarthritis Denies history of Gout, Rheumatoid Arthritis, Osteomyelitis Oncologic Patient has history of Received Chemotherapy, Received Radiation Medical And Surgical History Notes Oncologic breast cancer Objective Constitutional Vitals Time Taken: 11:53 AM, Height: 66 in, Weight: 153 lbs, BMI: 24.7, Temperature: 98.2 F, Pulse: 76 bpm, Respiratory Rate: 16 breaths/min, Blood Pressure: 108/61 mmHg. General Notes: Left lower extremity: Large open wound to the medial aspect with nonviable tissue and  granulation tissue present. Right foot: To the right great toe there is an open wound with circumferential callus and granulation tissue present post debridement. No signs of infection on exam Integumentary (Hair, Skin) Wound #1 status is Open. Original cause of wound was Gradually Appeared. The date acquired was: 04/06/2021. The wound has been in treatment 13 weeks. The wound is located on the Left,Medial Lower Leg. The wound measures 9.2cm length x 8cm width x 0.2cm depth; 57.805cm^2 area and 11.561cm^3 volume. There is Fat Layer (Subcutaneous Tissue) exposed. There is no tunneling or undermining noted. There is a large amount of serosanguineous drainage noted. There is small (1-33%) red granulation within the wound bed. There is a large (67-100%) amount of necrotic tissue within the wound bed including Adherent Slough. Wound #2 status is Open. Original cause of wound was Gradually Appeared. The date acquired was: 08/24/2021. The wound has been in treatment 6 weeks. The wound is located on the Right Toe Great. The wound measures 0.2cm length x 0.2cm width x 0.1cm depth; 0.031cm^2 area and 0.003cm^3 volume. There is Fat Layer (Subcutaneous Tissue) exposed. There is no tunneling or undermining noted. There is a medium amount of serous drainage noted. The wound margin is thickened. There is large (67-100%) red granulation within the wound bed. There is a small (1-33%) amount of necrotic tissue within the wound bed including Adherent Slough. Assessment Active Problems ICD-10 Unspecified open wound, left lower  leg, subsequent encounter Unspecified open wound of right great toe without damage to nail, subsequent encounter Venous insufficiency (chronic) (peripheral) Secondary malignant neoplasm of breast Essential (primary) hypertension Long term (current) use of anticoagulants Patient's wound is stable. No obvious signs of infection. I debrided nonviable tissue. Sorbact was not available with home  health but they have this now. Would like to give this a trial before changing to something like Iodoflex. We will continue with sorbact, gentamicin under compression therapy. Follow-up in 1 week Procedures Wound #1 Laura Santiago, Laura Santiago. 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 There was a Excisional Skin/Subcutaneous Tissue Debridement with a total area of 30 sq cm performed by Kalman Shan, MD. With the following instrument(s): Curette to remove Viable and Non-Viable tissue/material. Material removed includes Subcutaneous Tissue and Slough and after achieving pain control using Lidocaine. A time out was conducted at 12:02, prior to the start of the procedure. A Minimum amount of bleeding was controlled with Pressure. The procedure was tolerated well. Post Debridement Measurements: 9.2cm length x 8cm width x 0.2cm depth; 11.561cm^3 volume. Character of Wound/Ulcer Post Debridement is improved. Severity of Tissue Post Debridement is: Fat layer exposed. 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 There was a Three Layer Compression Therapy Procedure by Donnamarie Poag, RN. 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 There was a Excisional Skin/Subcutaneous Tissue Debridement with a total area of 0.04 sq cm performed by Kalman Shan, MD. With the following instrument(s): Curette to remove Viable and Non-Viable tissue/material. Material removed includes Subcutaneous Tissue and Slough and after achieving pain control using Lidocaine. A time out was conducted at 12:02, prior to the start of the procedure. A Minimum amount of bleeding was  controlled with Pressure. The procedure was tolerated well. Post Debridement Measurements: 0.2cm length x 0.2cm width x 0.2cm depth; 0.006cm^3 volume. Character of Wound/Ulcer Post Debridement is improved. Post procedure Diagnosis Wound #2: Same as Pre-Procedure Plan Follow-up Appointments: Return Appointment in 1 week. Nurse Visit as needed Home Health: [place]: - [place] for wound care. May utilize formulary equivalent dressing for wound treatment orders unless otherwise specified. Home Health Nurse may visit PRN to address patient s wound care needs. Scheduled days for dressing changes to be completed; exception, patient has scheduled wound care visit that day. - Twice a week, patient comes to office on Wednesday **Please direct any NON-WOUND related issues/requests for orders to patient's Primary Care Physician. **If current dressing causes regression in wound condition, may D/C ordered dressing product/s and apply Normal Saline Moist Dressing daily until next [place] or Other MD appointment. **[facility] of regression in wound condition at [phone]. Bathing/ Shower/ Hygiene: May shower with wound dressing protected with water repellent cover or cast protector. No tub bath. WOUND #1: - Lower Leg Wound Laterality: Left, Medial Cleanser: Soap and Water 3 x Per Week/30 Days Discharge Instructions: Gently cleanse wound with antibacterial soap, rinse and pat dry prior to dressing wounds Peri-Wound Care: Desitin Maximum Strength Ointment 4 (oz) 3 x Per Week/30 Days Discharge Instructions: Apply periwound Topical: Gentamicin 3 x Per Week/30 Days Discharge Instructions: Apply as directed by provider.  Primary Dressing: Cutimed Sorbact 1.5x 2.38 (in/in) 3 x Per Week/30 Days Discharge Instructions: A bacteria- and fungi binding wound dressing, suitable for cavities and fistulas. It is suitable as a wound filler and allows the  passage of wound exudate into a secondary dressing. The dressing helps reducing odor and pain and can improve healing. Secondary Dressing: Xtrasorb Large 6x9 (in/in) 3 x Per Week/30 Days Discharge Instructions: Apply to wound as directed. Do not cut. Compression Wrap: Profore Lite LF 3 Multilayer Compression Bandaging System 3 x Per Week/30 Days Discharge Instructions: Apply 3 multi-layer wrap as prescribed. WOUND #2: - Toe Great Wound Laterality: Right Cleanser: Wound Cleanser 1 x Per Day/30 Days Discharge Instructions: Wash your hands with soap and water. Remove old dressing, discard into plastic bag and place into trash. Cleanse the wound with Wound Cleanser prior to applying a clean dressing using gauze sponges, not tissues or cotton balls. Do not scrub or use excessive force. Pat dry using gauze sponges, not tissue or cotton balls. Primary Dressing: Silvercel Small 2x2 (in/in) 1 x Per Day/30 Days Discharge Instructions: Apply Silvercel Small 2x2 (in/in) as instructed Secondary Dressing: Gauze 1 x Per Day/30 Days Discharge Instructions: Cover with dry gauze Secured With: 43M Medipore H Soft Cloth Surgical Tape, 2x2 (in/yd) 1 x Per Day/30 Days Discharge Instructions: Secure dressing Laura Santiago, Laura Santiago. ([id number]) 1. In office sharp debridement 2. Sorbact and gentamicin under compression wrap 3. Follow-up in 1 week Electronic Signature(s) Signed: 10/05/2021 1:56:35 PM By: Kalman Shan DO Entered By: Kalman Shan on 10/05/2021 13:35:01 Laura Santiago ([id number]) -------------------------------------------------------------------------------- ROS/PFSH Details Patient Name: Laura Santiago. Date of Service: 10/05/2021 11:30 AM Medical Record Number: [id number] Patient Account Number: [id number] Date of Birth/Sex: 1946/11/06 (75 y.o. F) Treating RN: Donnamarie Poag Primary Care Provider: SYSTEM, PCP Other Clinician: Referring Provider: Randa Evens Treating Provider/Extender:  Yaakov Guthrie in Treatment: 13 Information Obtained From Patient Eyes Medical History: Negative for: Cataracts; Glaucoma; Optic Neuritis Ear/Nose/Mouth/Throat Medical History: Negative for: Chronic sinus problems/congestion; Middle ear problems Hematologic/Lymphatic Medical History: Negative for: Anemia; Hemophilia; Human Immunodeficiency Virus; Lymphedema; Sickle Cell Disease Respiratory Medical History: Negative for: Aspiration; Asthma; Chronic Obstructive Pulmonary Disease (COPD); Pneumothorax; Sleep Apnea; Tuberculosis Cardiovascular Medical History: Positive for: Hypertension Negative for: Angina; Arrhythmia; Congestive Heart Failure; Coronary Artery Disease; Deep Vein Thrombosis; Hypotension; Myocardial Infarction; Peripheral Arterial Disease; Peripheral Venous Disease; Phlebitis; Vasculitis Gastrointestinal Medical History: Negative for: Cirrhosis ; Colitis; Crohnos; Hepatitis A; Hepatitis B; Hepatitis C Endocrine Medical History: Negative for: Type I Diabetes; Type II Diabetes Genitourinary Medical History: Negative for: End Stage Renal Disease Immunological Medical History: Negative for: Lupus Erythematosus; Raynaudos; Scleroderma Integumentary (Skin) Medical History: Negative for: History of Burn; History of pressure wounds Musculoskeletal Medical History: Positive for: Osteoarthritis SKARLETTE, LATTNER ([id number]) Negative for: Gout; Rheumatoid Arthritis; Osteomyelitis Oncologic Medical History: Positive for: Received Chemotherapy; Received Radiation Past Medical History Notes: breast cancer Immunizations Pneumococcal Vaccine: Received Pneumococcal Vaccination: No Implantable Devices None Family and Social History Never smoker Electronic Signature(s) Signed: 10/05/2021 12:29:48 PM By: Donnamarie Poag Signed: 10/05/2021 1:56:35 PM By: Kalman Shan DO Entered By: Kalman Shan on 10/05/2021 12:20:59 Laura Santiago  ([id number]) -------------------------------------------------------------------------------- [place] Details Patient Name: Laura Santiago. Date of Service: 10/05/2021 Medical Record Number: [id number] Patient Account Number: [id number] Date of Birth/Sex: September 01, 1946 (75 y.o. F) Treating RN: Donnamarie Poag Primary Care Provider: SYSTEM, PCP Other Clinician: Referring Provider: Randa Evens Treating Provider/Extender: Yaakov Guthrie in Treatment: 13 Diagnosis Coding ICD-10 Codes Code Description S81.802D Unspecified open wound, left  lower leg, subsequent encounter S91.101D Unspecified open wound of right great toe without damage to nail, subsequent encounter I87.2 Venous insufficiency (chronic) (peripheral) 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 I10 Essential (primary) hypertension Z79.01 Long term (current) use of anticoagulants Facility Procedures CPT4 Code: 46950722 Description: 57505 - DEB SUBQ TISSUE 20 SQ CM/< Modifier: Quantity: 1 CPT4 Code: Description: ICD-10 Diagnosis Description S81.802D Unspecified open wound, left lower leg, subsequent encounter Modifier: Quantity: CPT4 Code: [id number] Description: 18984 - DEB SUBQ TISS EA ADDL 20CM Modifier: Quantity: 1 CPT4 Code: Description: ICD-10 Diagnosis Description S81.802D Unspecified open wound, left lower leg, subsequent encounter S91.101D Unspecified open wound of right great toe without damage to nail, subseq Modifier: uent encounter Quantity: Physician Procedures CPT4 Code: 2103128 Description: 11886 - WC PHYS SUBQ TISS 20 SQ CM Modifier: Quantity: 1 CPT4 Code: Description: ICD-10 Diagnosis Description S81.802D Unspecified open wound, left lower leg, subsequent encounter Modifier: Quantity: CPT4 Code: [id number] Description: 81594 - WC PHYS SUBQ TISS EA ADDL 20 CM Modifier: Quantity: 1 CPT4 Code: Description: ICD-10 Diagnosis Description S81.802D Unspecified open wound, left lower leg, subsequent  encounter S91.101D Unspecified open wound of right great toe without damage to nail, subseq Modifier: uent encounter Quantity: Electronic Signature(s) Signed: 10/05/2021 1:56:35 PM By: Kalman Shan DO Previous Signature: 10/05/2021 12:29:48 PM Version By: Donnamarie Poag Entered By: Kalman Shan on 10/05/2021 13:35:47

## 2021-10-05 NOTE — Progress Notes (Signed)
[name], [name] ([id number]) Visit Report for 10/05/2021 Arrival Information Details Patient Name: Laura Santiago, Laura Santiago. Date of Service: 10/05/2021 11:30 AM Medical Record Number: [id number] Patient Account Number: [id number] Date of Birth/Sex: June 04, 1946 (75 y.o. F) Treating RN: Donnamarie Poag Primary Care Ketrick Matney: SYSTEM, PCP Other Clinician: Referring Nishi Neiswonger: Randa Evens Treating Gerhart Ruggieri/Extender: Yaakov Guthrie in Treatment: 13 Visit Information History Since Last Visit Added or deleted any medications: No Patient Arrived: [name] Had a fall or experienced change in No Arrival Time: 11:51 activities of daily living that may affect Accompanied By: dog risk of falls: Transfer Assistance: None Hospitalized since last visit: No Patient Identification Verified: Yes Has Dressing in Place as Prescribed: Yes Secondary Verification Process Completed: Yes Pain Present Now: Yes Patient Has Alerts: Yes Patient Alerts: PT [place] Signature(s) Signed: 10/05/2021 12:29:48 PM By: Donnamarie Poag Entered By: Donnamarie Poag on 10/05/2021 11:53:13 Laura Santiago ([id number]) -------------------------------------------------------------------------------- Clinic Level of Care Assessment Details Patient Name: Laura Santiago. Date of Service: 10/05/2021 11:30 AM Medical Record Number: [id number] Patient Account Number: [id number] Date of Birth/Sex: 1946/12/10 (75 y.o. F) Treating RN: Donnamarie Poag Primary Care Brownie Gockel: SYSTEM, PCP Other Clinician: Referring Geremy Rister: Randa Evens Treating Carrol Bondar/Extender: Yaakov Guthrie in Treatment: 13 Clinic Level of Care Assessment Items TOOL 1 Quantity Score _0  - Use when EandM and Procedure is performed on INITIAL visit 0 ASSESSMENTS - Nursing Assessment / Reassessment _1  - General Physical Exam (combine w/ comprehensive assessment (listed just below) when performed on new 0 pt. evals) _2  - 0 Comprehensive  Assessment (HX, ROS, Risk Assessments, Wounds Hx, etc.) ASSESSMENTS - Wound and Skin Assessment / Reassessment _3  - Dermatologic / Skin Assessment (not related to wound area) 0 ASSESSMENTS - Ostomy and/or Continence Assessment and Care _4  - Incontinence Assessment and Management 0 _5  - 0 Ostomy Care Assessment and Management (repouching, etc.) PROCESS - Coordination of Care _6  - Simple Patient / Family Education for ongoing care 0 _7  - 0 Complex (extensive) Patient / Family Education for ongoing care _8  - 0 Staff obtains Programmer, systems, Records, Test Results / Process Orders _9  - 0 Staff telephones HHA, Nursing Homes / Clarify orders / etc _10  - 0 Routine Transfer to another Facility (non-emergent condition) _11  - 0 Routine Hospital Admission (non-emergent condition) _12  - 0 New Admissions / Biomedical engineer / Ordering NPWT, Apligraf, etc. _13  - 0 Emergency Hospital Admission (emergent condition) PROCESS - Special Needs _14  - Pediatric / Minor Patient Management 0 _15  - 0 Isolation Patient Management _16  - 0 Hearing / Language / Visual special needs _17  - 0 Assessment of Community assistance (transportation, D/C planning, etc.) _18  - 0 Additional assistance / Altered mentation _19  - 0 Support Surface(s) Assessment (bed, cushion, seat, etc.) INTERVENTIONS - Miscellaneous _20  - External ear exam 0 _21  - 0 Patient Transfer (multiple staff / Civil Service fast streamer / Similar devices) _22  - 0 Simple Staple / Suture removal (25 or less) _23  - 0 Complex Staple / Suture removal (26 or more) _24  - 0 Hypo/Hyperglycemic Management (do not check if billed separately) _25  - 0 Ankle / Brachial Index (ABI) - do not check if billed separately Has the patient been seen at the hospital within the last three years: Yes Total Score: 0 Level Of Care: ____ Laura Santiago ([id number]) Electronic Signature(s) Signed: 10/05/2021 12:29:48 PM By: Donnamarie Poag Entered By: Donnamarie Poag on 10/05/2021 12:17:19 Laura Santiago ([id number]) -------------------------------------------------------------------------------- Compression Therapy Details Patient Name: Laura Santiago. Date of Service: 10/05/2021 11:30 AM Medical  Record Number: [id number] Patient Account Number: [id number] Date of Birth/Sex: February 02, 1946 (75 y.o. F) Treating RN: Donnamarie Poag Primary Care Deneene Tarver: SYSTEM, PCP Other Clinician: Referring Vinicius Brockman: Randa Evens Treating [employer]/Extender: Yaakov Guthrie in Treatment: 13 Compression Therapy Performed for Wound Assessment: Wound #1 Left,Medial Lower Leg Performed By: Clinician Donnamarie Poag, RN Compression Type: Three Layer Post Procedure Diagnosis Same as Pre-procedure Electronic Signature(s) Signed: 10/05/2021 12:29:48 PM By: Donnamarie Poag Entered By: Donnamarie Poag on 10/05/2021 12:05:49 Laura Santiago ([id number]) -------------------------------------------------------------------------------- Encounter Discharge Information Details Patient Name: Laura Santiago. Date of Service: 10/05/2021 11:30 AM Medical Record Number: [id number] Patient Account Number: [id number] Date of Birth/Sex: 1946-05-05 (75 y.o. F) Treating RN: Donnamarie Poag Primary Care Hudsyn Champine: SYSTEM, PCP Other Clinician: Referring Camari Wisham: Randa Evens Treating Rocsi Hazelbaker/Extender: Yaakov Guthrie in Treatment: 13 Encounter Discharge Information Items Post Procedure Vitals Discharge Condition: Stable Temperature (F): 98.2 Ambulatory Status: Walker Pulse (bpm): 76 Discharge Destination: Home Respiratory Rate (breaths/min): 16 Transportation: Private Auto Blood Pressure (mmHg): 108/61 Accompanied By: self Schedule Follow-up Appointment: Yes Clinical Summary of Care: Electronic Signature(s) Signed: 10/05/2021 12:29:48 PM By: Donnamarie Poag Entered By: Donnamarie Poag on 10/05/2021 12:29:30 Laura Santiago  ([id number]) -------------------------------------------------------------------------------- Lower Extremity Assessment Details Patient Name: Laura Santiago. Date of Service: 10/05/2021 11:30 AM Medical Record Number: [id number] Patient Account Number: [id number] Date of Birth/Sex: [birth year] (75 y.o. F) Treating RN: Donnamarie Poag Primary Care Starletta Houchin: SYSTEM, PCP Other Clinician: Referring Alaia Lordi: Randa Evens Treating Keidrick Murty/Extender: Yaakov Guthrie in Treatment: 13 Edema Assessment Assessed: [Left: Yes] [Right: Yes] Edema: [Left: No] [Right: No] Calf Left: Right: Point of Measurement: 29 cm From Medial Instep 36 cm Ankle Left: Right: Point of Measurement: 10 cm From Medial Instep 22 cm Vascular Assessment Pulses: Dorsalis Pedis Palpable: [Left:Yes] [Right:Yes] Electronic Signature(s) Signed: 10/05/2021 12:29:48 PM By: Donnamarie Poag Entered By: Donnamarie Poag on 10/05/2021 12:00:59 Laura Santiago ([id number]) -------------------------------------------------------------------------------- Multi Wound Chart Details Patient Name: Laura Santiago. Date of Service: 10/05/2021 11:30 AM Medical Record Number: [id number] Patient Account Number: [id number] Date of Birth/Sex: Oct 11, 1946 (75 y.o. 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] [N/A:N/A] Comorbid History: [1:Hypertension, Osteoarthritis,  Received Chemotherapy, Received Radiation] [2:Hypertension, Osteoarthritis, Received Chemotherapy, Received Radiation] [N/A:N/A] Date Acquired: [1:04/06/2021] [2:08/24/2021] [N/A:N/A] Weeks of Treatment: [1:13] [2:6] [N/A:N/A] Wound Status: [1:Open] [2:Open] [N/A:N/A] Clustered Wound: [1:Yes] [2:No] [N/A:N/A] Clustered Quantity: [1:2] [2:N/A] [N/A:N/A] Measurements L x W x D (cm) [1:9.2x8x0.2] [2:0.2x0.2x0.1] [N/A:N/A] Area (cm) : [1:57.805] [2:0.031] [N/A:N/A] Volume (cm) : [1:11.561] [2:0.003] [N/A:N/A] % Reduction in Area: [1:36.00%] [2:80.30%] [N/A:N/A] % Reduction in Volume: [1:36.00%] [2:95.20%] [N/A:N/A] Classification: [1:Full Thickness Without Exposed Support Structures] [2:Full Thickness Without Exposed Support Structures] [N/A:N/A] Exudate Amount: [1:Large] [2:Medium] [N/A:N/A] Exudate Type: [1:Serosanguineous] [2:Serous] [N/A:N/A] Exudate Color: [1:red, brown] [2:amber] [N/A:N/A] Wound Margin: [1:N/A] [2:Thickened] [N/A:N/A] Granulation Amount: [1:Small (1-33%)] [2:Large (67-100%)] [N/A:N/A] Granulation Quality: [1:Red] [2:Red] [N/A:N/A] Necrotic Amount: [1:Large (67-100%)] [2:Small (1-33%)] [N/A:N/A] Exposed Structures: [1:Fat Layer (Subcutaneous Tissue): Yes Fascia: No Tendon: No Muscle: No Joint: No Bone: No] [2:Fat Layer (Subcutaneous Tissue): Yes Fascia: No Tendon: No Muscle: No Joint: No Bone: No] [N/A:N/A] Epithelialization: [1:Large (67-100%)] [2:None] [N/A:N/A] Debridement: [  1:Debridement - Excisional] [2:Debridement - Excisional] [N/A:N/A] Pre-procedure Verification/Time 12:02 [2:12:02] [N/A:N/A] Out Taken: Pain Control: [1:Lidocaine] [2:Lidocaine] [N/A:N/A] Tissue Debrided: [1:Subcutaneous, Slough] [2:Subcutaneous, Slough] [N/A:N/A] Level: [1:Skin/Subcutaneous Tissue] [2:Skin/Subcutaneous Tissue] [N/A:N/A] Debridement Area (sq cm): [1:30] [2:0.04] [N/A:N/A] Instrument: [1:Curette] [2:Curette] [N/A:N/A] Bleeding: [1:Minimum] [2:Minimum] [N/A:N/A] Hemostasis  Achieved: [1:Pressure] [2:Pressure] [N/A:N/A] Debridement Treatment [1:Procedure was tolerated well] [2:Procedure was tolerated well] [N/A:N/A] Response: Post Debridement [1:9.2x8x0.2] [2:0.2x0.2x0.2] [N/A:N/A] Measurements L x W x D (cm) Post Debridement Volume: [1:11.561] [2:0.006] [N/A:N/A] (cm) Procedures Performed: [1:Compression Therapy Debridement] [N/A:N/A] Treatment Notes Electronic Signature(s) Signed: 10/05/2021 1:56:35 PM By: Kalman Shan DO Entered By: Kalman Shan on 10/05/2021 12:19:37 Laura Santiago ([id number]) -------------------------------------------------------------------------------- [place] Details Patient Name: Laura Santiago. Date of Service: 10/05/2021 11:30 AM Medical Record Number: [id number] Patient Account Number: [id number] Date of Birth/Sex: 1946/09/12 (75 y.o. F) Treating RN: Donnamarie Poag Primary Care Naraya Stoneberg: SYSTEM, PCP Other Clinician: Referring Fiona Coto: Randa Evens Treating Azarius Lambson/Extender: Yaakov Guthrie in Treatment: 13 Active Inactive Wound/Skin Impairment Nursing Diagnoses: Impaired tissue integrity Goals: Patient/caregiver will verbalize understanding of skin care regimen Date Initiated: 07/06/2021 Date Inactivated: 07/27/2021 Target Resolution Date: 07/06/2021 Goal Status: Met Ulcer/skin breakdown will have a volume reduction of 30% by week 4 Date Initiated: 07/06/2021 Target Resolution Date: 08/06/2021 Goal Status: Active Ulcer/skin breakdown will have a volume reduction of 50% by week 8 Date Initiated: 07/06/2021 Target Resolution Date: 09/06/2021 Goal Status: Active Ulcer/skin breakdown will have a volume reduction of 80% by week 12 Date Initiated: 07/06/2021 Target Resolution Date: 10/06/2021 Goal Status: Active Ulcer/skin breakdown will heal within 14 weeks Date Initiated: 07/06/2021 Target Resolution Date: 11/06/2021 Goal Status: Active Interventions: Assess patient/caregiver  ability to obtain necessary supplies Assess patient/caregiver ability to perform ulcer/skin care regimen upon admission and as needed Assess ulceration(s) every visit Treatment Activities: Referred to DME Aaronjames Kelsay for dressing supplies : 07/06/2021 Skin care regimen initiated : 07/06/2021 Notes: Electronic Signature(s) Signed: 10/05/2021 12:29:48 PM By: Donnamarie Poag Entered By: Donnamarie Poag on 10/05/2021 12:05:25 Laura Santiago ([id number]) -------------------------------------------------------------------------------- Pain Assessment Details Patient Name: Laura Santiago. Date of Service: 10/05/2021 11:30 AM Medical Record Number: [id number] Patient Account Number: [id number] Date of Birth/Sex: December 17, 1945 (75 y.o. F) Treating RN: Donnamarie Poag Primary Care Yoshua Geisinger: SYSTEM, PCP Other Clinician: Referring Nyema Hachey: Randa Evens Treating Tessia Kassin/Extender: Yaakov Guthrie in Treatment: 13 Active Problems Location of Pain Severity and Description of Pain Patient Has Paino Yes Site Locations Pain Location: Generalized Pain, Pain in Ulcers Rate the pain. Current Pain Level: 7 Pain Management and Medication Current Pain Management: Electronic Signature(s) Signed: 10/05/2021 12:29:48 PM By: Donnamarie Poag Entered By: Donnamarie Poag on 10/05/2021 11:54:58 Laura Santiago ([id number]) -------------------------------------------------------------------------------- Patient/Caregiver Education Details Patient Name: Laura Santiago. Date of Service: 10/05/2021 11:30 AM Medical Record Number: [id number] Patient Account Number: [id number] Date of Birth/Gender: 06-May-1946 (75 y.o. F) Treating RN: Donnamarie Poag Primary Care Physician: SYSTEM, PCP Other Clinician: Referring Physician: Randa Evens Treating Physician/Extender: Yaakov Guthrie in Treatment: 13 Education Assessment Education Provided To: Patient Education Topics Provided Wound/Skin Impairment: Electronic  Signature(s) Signed: 10/05/2021 12:29:48 PM By: Donnamarie Poag Entered By: Donnamarie Poag on 10/05/2021 12:19:31 Laura Santiago ([id number]) -------------------------------------------------------------------------------- Wound Assessment Details Patient Name: Laura Santiago. Date of Service: 10/05/2021 11:30 AM Medical Record Number: [id number] Patient Account Number: [id number] Date of Birth/Sex: 07-22-1946 (75 y.o. F) Treating RN: Donnamarie Poag Primary Care Diesha Rostad: SYSTEM, PCP Other Clinician: Referring Umi Mainor: Randa Evens Treating Daphna Lafuente/Extender: Yaakov Guthrie in Treatment: 13 Wound Status Wound Number: 1 Primary Venous  Leg Ulcer Etiology: Wound Location: Left, Medial Lower Leg Wound Status: Open Wounding Event: Gradually Appeared Comorbid Hypertension, Osteoarthritis, Received Chemotherapy, Date Acquired: 04/06/2021 History: Received Radiation Weeks Of Treatment: 13 Clustered Wound: Yes Photos Photo Uploaded By: Donnamarie Poag on 10/05/2021 16:20:44 Wound Measurements Length: (cm) 9.2 Width: (cm) 8 Depth: (cm) 0.2 Clustered Quantity: 2 Area: (cm) 57.805 Volume: (cm) 11.561 % Reduction in Area: 36% % Reduction in Volume: 36% Epithelialization: Large (67-100%) Tunneling: No Undermining: No Wound Description Classification: Full Thickness Without Exposed Support Structu Exudate Amount: Large Exudate Type: Serosanguineous Exudate Color: red, brown res Foul Odor After Cleansing: No Slough/Fibrino Yes Wound Bed Granulation Amount: Small (1-33%) Exposed Structure Granulation Quality: Red Fascia Exposed: No Necrotic Amount: Large (67-100%) Fat Layer (Subcutaneous Tissue) Exposed: Yes Necrotic Quality: Adherent Slough Tendon Exposed: No Muscle Exposed: No Joint Exposed: No Bone Exposed: No Treatment Notes Wound #1 (Lower Leg) Wound Laterality: Left, Medial Cleanser Soap and Water Discharge Instruction: Gently cleanse wound with antibacterial soap,  rinse and pat dry prior to dressing wounds RANDILYN, FOISY M. ([id number]) Peri-Wound Care Desitin Maximum Strength Ointment 4 (oz) Discharge Instruction: Apply periwound Topical Gentamicin Discharge Instruction: Apply as directed by Zoria Rawlinson. Primary Dressing Cutimed Sorbact 1.5x 2.38 (in/in) Discharge Instruction: A bacteria- and fungi binding wound dressing, suitable for cavities and fistulas. It is suitable as a wound filler and allows the passage of wound exudate into a secondary dressing. The dressing helps reducing odor and pain and can improve healing. Secondary Dressing Xtrasorb Large 6x9 (in/in) Discharge Instruction: Apply to wound as directed. Do not cut. Secured With Compression Wrap Profore Lite LF 3 Multilayer Compression Bandaging System Discharge Instruction: Apply 3 multi-layer wrap as prescribed. Compression Stockings Add-Ons Electronic Signature(s) Signed: 10/05/2021 12:29:48 PM By: Donnamarie Poag Entered By: Donnamarie Poag on 10/05/2021 11:59:00 Laura Santiago ([id number]) -------------------------------------------------------------------------------- Wound Assessment Details Patient Name: Laura Santiago. Date of Service: 10/05/2021 11:30 AM Medical Record Number: [id number] Patient Account Number: [id number] Date of Birth/Sex: Aug 05, 1946 (75 y.o. F) Treating RN: Donnamarie Poag Primary Care Anastyn Ayars: SYSTEM, PCP Other Clinician: Referring Soua Caltagirone: Randa Evens Treating Gloris Shiroma/Extender: Yaakov Guthrie in Treatment: 13 Wound Status Wound Number: 2 Primary Neuropathic Ulcer-Non Diabetic Etiology: Wound Location: Right Toe Great Wound Status: Open Wounding Event: Gradually Appeared Comorbid Hypertension, Osteoarthritis, Received Chemotherapy, Date Acquired: 08/24/2021 History: Received Radiation Weeks Of Treatment: 6 Clustered Wound: No Photos Photo Uploaded By: Donnamarie Poag on 10/05/2021 16:21:04 Wound Measurements Length: (cm) 0.2 Width: (cm)  0.2 Depth: (cm) 0.1 Area: (cm) 0.031 Volume: (cm) 0.003 % Reduction in Area: 80.3% % Reduction in Volume: 95.2% Epithelialization: None Tunneling: No Undermining: No Wound Description Classification: Full Thickness Without Exposed Support Structures Wound Margin: Thickened Exudate Amount: Medium Exudate Type: Serous Exudate Color: amber Foul Odor After Cleansing: No Slough/Fibrino Yes Wound Bed Granulation Amount: Large (67-100%) Exposed Structure Granulation Quality: Red Fascia Exposed: No Necrotic Amount: Small (1-33%) Fat Layer (Subcutaneous Tissue) Exposed: Yes Necrotic Quality: Adherent Slough Tendon Exposed: No Muscle Exposed: No Joint Exposed: No Bone Exposed: No Treatment Notes Wound #2 (Toe Great) Wound Laterality: Right Cleanser Wound Cleanser Discharge Instruction: Wash your hands with soap and water. Remove old dressing, discard into plastic bag and place into trash. Cleanse the wound with Wound Cleanser prior to applying a clean dressing using gauze sponges, not tissues or cotton balls. Do not KATHEEN, ASLIN. ([id number]) scrub or use excessive force. Pat dry using gauze sponges, not tissue or cotton balls. Peri-Wound Care Topical Primary Dressing Silvercel Small 2x2 (in/in) Discharge Instruction:  Apply Silvercel Small 2x2 (in/in) as instructed Secondary Dressing Gauze Discharge Instruction: Cover with dry gauze Secured With 23M Medipore H Soft Cloth Surgical Tape, 2x2 (in/yd) Discharge Instruction: Secure dressing Compression Wrap Compression Stockings Add-Ons Electronic Signature(s) Signed: 10/05/2021 12:29:48 PM By: Donnamarie Poag Entered By: Donnamarie Poag on 10/05/2021 12:00:31 Laura Santiago ([id number]) -------------------------------------------------------------------------------- Vitals Details Patient Name: Laura Santiago. Date of Service: 10/05/2021 11:30 AM Medical Record Number: [id number] Patient Account Number: [id number] Date  of Birth/Sex: 1946-07-04 (75 y.o. F) Treating RN: Donnamarie Poag Primary Care Jamaurie Bernier: SYSTEM, PCP Other Clinician: Referring Liseth Wann: Randa Evens Treating Noely Kuhnle/Extender: Yaakov Guthrie in Treatment: 13 Vital Signs Time Taken: 11:53 Temperature (F): 98.2 Height (in): 66 Pulse (bpm): 76 Weight (lbs): 153 Respiratory Rate (breaths/min): 16 Body Mass Index (BMI): 24.7 Blood Pressure (mmHg): 108/61 Reference Range: 80 - 120 mg / dl Electronic Signature(s) Signed: 10/05/2021 12:29:48 PM By: Donnamarie Poag Entered ByDonnamarie Poag on 10/05/2021 11:54:49

## 2021-10-07 ENCOUNTER — Other Ambulatory Visit: Payer: Self-pay

## 2021-10-07 ENCOUNTER — Other Ambulatory Visit: Payer: Self-pay | Admitting: *Deleted

## 2021-10-07 ENCOUNTER — Inpatient Hospital Stay: Payer: Medicare Other

## 2021-10-07 ENCOUNTER — Other Ambulatory Visit: Payer: Self-pay | Admitting: Oncology

## 2021-10-07 VITALS — BP 147/83 | HR 72 | Temp 97.3°F | Resp 18

## 2021-10-07 VITALS — Wt 157.6 lb

## 2021-10-07 DIAGNOSIS — Z5111 Encounter for antineoplastic chemotherapy: Secondary | ICD-10-CM | POA: Diagnosis not present

## 2021-10-07 DIAGNOSIS — Z95828 Presence of other vascular implants and grafts: Secondary | ICD-10-CM

## 2021-10-07 DIAGNOSIS — C50919 Malignant neoplasm of unspecified site of unspecified female breast: Secondary | ICD-10-CM

## 2021-10-07 LAB — CBC WITH DIFFERENTIAL/PLATELET
Abs Immature Granulocytes: 0.05 10*3/uL (ref 0.00–0.07)
Basophils Absolute: 0 10*3/uL (ref 0.0–0.1)
Basophils Relative: 1 %
Eosinophils Absolute: 0.2 10*3/uL (ref 0.0–0.5)
Eosinophils Relative: 3 %
HCT: 30.5 % — ABNORMAL LOW (ref 36.0–46.0)
Hemoglobin: 9.3 g/dL — ABNORMAL LOW (ref 12.0–15.0)
Immature Granulocytes: 1 %
Lymphocytes Relative: 17 %
Lymphs Abs: 0.9 10*3/uL (ref 0.7–4.0)
MCH: 28.4 pg (ref 26.0–34.0)
MCHC: 30.5 g/dL (ref 30.0–36.0)
MCV: 93.3 fL (ref 80.0–100.0)
Monocytes Absolute: 0.3 10*3/uL (ref 0.1–1.0)
Monocytes Relative: 6 %
Neutro Abs: 3.8 10*3/uL (ref 1.7–7.7)
Neutrophils Relative %: 72 %
Platelets: 240 10*3/uL (ref 150–400)
RBC: 3.27 MIL/uL — ABNORMAL LOW (ref 3.87–5.11)
RDW: 15.7 % — ABNORMAL HIGH (ref 11.5–15.5)
WBC: 5.2 10*3/uL (ref 4.0–10.5)
nRBC: 0 % (ref 0.0–0.2)

## 2021-10-07 LAB — COMPREHENSIVE METABOLIC PANEL
ALT: 11 U/L (ref 0–44)
AST: 16 U/L (ref 15–41)
Albumin: 3.5 g/dL (ref 3.5–5.0)
Alkaline Phosphatase: 64 U/L (ref 38–126)
Anion gap: 4 — ABNORMAL LOW (ref 5–15)
BUN: 21 mg/dL (ref 8–23)
CO2: 28 mmol/L (ref 22–32)
Calcium: 8.5 mg/dL — ABNORMAL LOW (ref 8.9–10.3)
Chloride: 106 mmol/L (ref 98–111)
Creatinine, Ser: 1.27 mg/dL — ABNORMAL HIGH (ref 0.44–1.00)
GFR, Estimated: 44 mL/min — ABNORMAL LOW (ref >60)
Glucose, Bld: 105 mg/dL — ABNORMAL HIGH (ref 70–99)
Potassium: 4.1 mmol/L (ref 3.5–5.1)
Sodium: 138 mmol/L (ref 135–145)
Total Bilirubin: 0.6 mg/dL (ref 0.3–1.2)
Total Protein: 6.4 g/dL — ABNORMAL LOW (ref 6.5–8.1)

## 2021-10-07 MED ORDER — SODIUM CHLORIDE 0.9% FLUSH
10.0000 mL | Freq: Once | INTRAVENOUS | Status: AC
  Administered 2021-10-07: 10 mL via INTRAVENOUS
  Filled 2021-10-07: qty 10

## 2021-10-07 MED ORDER — HEPARIN SOD (PORK) LOCK FLUSH 100 UNIT/ML IV SOLN
INTRAVENOUS | Status: AC
  Administered 2021-10-07: 500 [IU]
  Filled 2021-10-07: qty 5

## 2021-10-07 MED ORDER — FAMOTIDINE 20 MG IN NS 100 ML IVPB
20.0000 mg | Freq: Once | INTRAVENOUS | Status: AC
  Administered 2021-10-07: 20 mg via INTRAVENOUS
  Filled 2021-10-07: qty 20

## 2021-10-07 MED ORDER — SODIUM CHLORIDE 0.9 % IV SOLN
65.0000 mg/m2 | Freq: Once | INTRAVENOUS | Status: AC
  Administered 2021-10-07: 114 mg via INTRAVENOUS
  Filled 2021-10-07: qty 19

## 2021-10-07 MED ORDER — AMPHETAMINE-DEXTROAMPHET ER 30 MG PO CP24: 30.0000 mg | ORAL_CAPSULE | Freq: Every day | ORAL | 0 refills | Status: DC

## 2021-10-07 MED ORDER — SODIUM CHLORIDE 0.9 % IV SOLN
10.0000 mg | Freq: Once | INTRAVENOUS | Status: AC
  Administered 2021-10-07: 10 mg via INTRAVENOUS
  Filled 2021-10-07: qty 10

## 2021-10-07 MED ORDER — SODIUM CHLORIDE 0.9 % IV SOLN
Freq: Once | INTRAVENOUS | Status: AC
  Filled 2021-10-07: qty 250

## 2021-10-07 MED ORDER — DIPHENHYDRAMINE HCL 50 MG/ML IJ SOLN
50.0000 mg | Freq: Once | INTRAMUSCULAR | Status: AC
  Administered 2021-10-07: 50 mg via INTRAVENOUS
  Filled 2021-10-07: qty 1

## 2021-10-07 MED ORDER — OXYCODONE HCL 10 MG PO TABS: 10.0000 mg | ORAL_TABLET | ORAL | 0 refills | Status: DC | PRN

## 2021-10-07 NOTE — Patient Instructions (Signed)
[facility]  Discharge Instructions: Thank you for choosing [facility] to provide your oncology and hematology care.  If you have a lab appointment with the Cancer Center, please go directly to the Cancer Center and check in at the registration area.  Wear comfortable clothing and clothing appropriate for easy access to any Portacath or PICC line.   We strive to give you quality time with your provider. You may need to reschedule your appointment if you arrive late (15 or more minutes).  Arriving late affects you and other patients whose appointments are after yours.  Also, if you miss three or more appointments without notifying the office, you may be dismissed from the clinic at the provider's discretion.      For prescription refill requests, have your pharmacy contact our office and allow 72 hours for refills to be completed.    Today you received the following chemotherapy and/or immunotherapy agents TAXOL      To help prevent nausea and vomiting after your treatment, we encourage you to take your nausea medication as directed.  BELOW ARE SYMPTOMS THAT SHOULD BE REPORTED IMMEDIATELY: *FEVER GREATER THAN 100.4 F (38 C) OR HIGHER *CHILLS OR SWEATING *NAUSEA AND VOMITING THAT IS NOT CONTROLLED WITH YOUR NAUSEA MEDICATION *UNUSUAL SHORTNESS OF BREATH *UNUSUAL BRUISING OR BLEEDING *URINARY PROBLEMS (pain or burning when urinating, or frequent urination) *BOWEL PROBLEMS (unusual diarrhea, constipation, pain near the anus) TENDERNESS IN MOUTH AND THROAT WITH OR WITHOUT PRESENCE OF ULCERS (sore throat, sores in mouth, or a toothache) UNUSUAL RASH, SWELLING OR PAIN  UNUSUAL VAGINAL DISCHARGE OR ITCHING   Items with * indicate a potential emergency and should be followed up as soon as possible or go to the Emergency Department if any problems should occur.  Please show the CHEMOTHERAPY ALERT CARD or IMMUNOTHERAPY ALERT CARD at check-in to  the Emergency Department and triage nurse.  Should you have questions after your visit or need to cancel or reschedule your appointment, please contact [facility]  [phone] and follow the prompts.  Office hours are 8:00 a.m. to 4:30 p.m. Monday - Friday. Please note that voicemails left after 4:00 p.m. may not be returned until the following business day.  We are closed weekends and major holidays. You have access to a nurse at all times for urgent questions. Please call the main number to the clinic [phone] and follow the prompts.  For any non-urgent questions, you may also contact your provider using MyChart. We now offer e-Visits for anyone 18 and older to request care online for non-urgent symptoms. For details visit [email or web address].   Also download the MyChart app! Go to the app store, search "MyChart", open the app, select [facility], and log in with your MyChart username and password.  Due to Covid, a mask is required upon entering the hospital/clinic. If you do not have a mask, one will be given to you upon arrival. For doctor visits, patients may have 1 support person aged 18 or older with them. For treatment visits, patients cannot have anyone with them due to current Covid guidelines and our immunocompromised population.   Paclitaxel injection What is this medication? PACLITAXEL (PAK li TAX el) is a chemotherapy drug. It targets fast dividing cells, like cancer cells, and causes these cells to die. This medicine is used to treat ovarian cancer, breast cancer, lung cancer, Kaposi's sarcoma, and other cancers. This medicine may be used for other purposes; ask   your health care provider or pharmacist if you have questions. COMMON BRAND NAME(S): Onxol, Taxol What should I tell my care team before I take this medication? They need to know if you have any of these conditions: history of irregular heartbeat liver disease low blood counts,  like low white cell, platelet, or red cell counts lung or breathing disease, like asthma tingling of the fingers or toes, or other nerve disorder an unusual or allergic reaction to paclitaxel, alcohol, polyoxyethylated castor oil, other chemotherapy, other medicines, foods, dyes, or preservatives pregnant or trying to get pregnant breast-feeding How should I use this medication? This drug is given as an infusion into a vein. It is administered in a hospital or clinic by a specially trained health care professional. Talk to your pediatrician regarding the use of this medicine in children. Special care may be needed. Overdosage: If you think you have taken too much of this medicine contact a poison control center or emergency room at once. NOTE: This medicine is only for you. Do not share this medicine with others. What if I miss a dose? It is important not to miss your dose. Call your doctor or health care professional if you are unable to keep an appointment. What may interact with this medication? Do not take this medicine with any of the following medications: live virus vaccines This medicine may also interact with the following medications: antiviral medicines for hepatitis, HIV or AIDS certain antibiotics like erythromycin and clarithromycin certain medicines for fungal infections like ketoconazole and itraconazole certain medicines for seizures like carbamazepine, phenobarbital, phenytoin gemfibrozil nefazodone rifampin St. John's wort This list may not describe all possible interactions. Give your health care provider a list of all the medicines, herbs, non-prescription drugs, or dietary supplements you use. Also tell them if you smoke, drink alcohol, or use illegal drugs. Some items may interact with your medicine. What should I watch for while using this medication? Your condition will be monitored carefully while you are receiving this medicine. You will need important blood work  done while you are taking this medicine. This medicine can cause serious allergic reactions. To reduce your risk you will need to take other medicine(s) before treatment with this medicine. If you experience allergic reactions like skin rash, itching or hives, swelling of the face, lips, or tongue, tell your doctor or health care professional right away. In some cases, you may be given additional medicines to help with side effects. Follow all directions for their use. This drug may make you feel generally unwell. This is not uncommon, as chemotherapy can affect healthy cells as well as cancer cells. Report any side effects. Continue your course of treatment even though you feel ill unless your doctor tells you to stop. Call your doctor or health care professional for advice if you get a fever, chills or sore throat, or other symptoms of a cold or flu. Do not treat yourself. This drug decreases your body's ability to fight infections. Try to avoid being around people who are sick. This medicine may increase your risk to bruise or bleed. Call your doctor or health care professional if you notice any unusual bleeding. Be careful brushing and flossing your teeth or using a toothpick because you may get an infection or bleed more easily. If you have any dental work done, tell your dentist you are receiving this medicine. Avoid taking products that contain aspirin, acetaminophen, ibuprofen, naproxen, or ketoprofen unless instructed by your doctor. These medicines may hide a   fever. Do not become pregnant while taking this medicine. Women should inform their doctor if they wish to become pregnant or think they might be pregnant. There is a potential for serious side effects to an unborn child. Talk to your health care professional or pharmacist for more information. Do not breast-feed an infant while taking this medicine. Men are advised not to father a child while receiving this medicine. This product may  contain alcohol. Ask your pharmacist or healthcare provider if this medicine contains alcohol. Be sure to tell all healthcare providers you are taking this medicine. Certain medicines, like metronidazole and disulfiram, can cause an unpleasant reaction when taken with alcohol. The reaction includes flushing, headache, nausea, vomiting, sweating, and increased thirst. The reaction can last from 30 minutes to several hours. What side effects may I notice from receiving this medication? Side effects that you should report to your doctor or health care professional as soon as possible: allergic reactions like skin rash, itching or hives, swelling of the face, lips, or tongue breathing problems changes in vision fast, irregular heartbeat high or low blood pressure mouth sores pain, tingling, numbness in the hands or feet signs of decreased platelets or bleeding - bruising, pinpoint red spots on the skin, black, tarry stools, blood in the urine signs of decreased red blood cells - unusually weak or tired, feeling faint or lightheaded, falls signs of infection - fever or chills, cough, sore throat, pain or difficulty passing urine signs and symptoms of liver injury like dark yellow or brown urine; general ill feeling or flu-like symptoms; light-colored stools; loss of appetite; nausea; right upper belly pain; unusually weak or tired; yellowing of the eyes or skin swelling of the ankles, feet, hands unusually slow heartbeat Side effects that usually do not require medical attention (report to your doctor or health care professional if they continue or are bothersome): diarrhea hair loss loss of appetite muscle or joint pain nausea, vomiting pain, redness, or irritation at site where injected tiredness This list may not describe all possible side effects. Call your doctor for medical advice about side effects. You may report side effects to FDA at 1-800-FDA-1088. Where should I keep my  medication? This drug is given in a hospital or clinic and will not be stored at home. NOTE: This sheet is a summary. It may not cover all possible information. If you have questions about this medicine, talk to your doctor, pharmacist, or health care provider.  2022 [employer] Standard (2019-10-29 13:37:23)  

## 2021-10-07 NOTE — Progress Notes (Signed)
Ok use PAC for infusion even thought there is no blood return per Clyda Greener.  Pt states "this is normal"

## 2021-10-09 ENCOUNTER — Encounter: Payer: Self-pay | Admitting: Oncology

## 2021-10-12 ENCOUNTER — Encounter: Payer: Medicare Other | Attending: Internal Medicine | Admitting: Internal Medicine

## 2021-10-12 ENCOUNTER — Other Ambulatory Visit: Payer: Self-pay

## 2021-10-12 DIAGNOSIS — S91101A Unspecified open wound of right great toe without damage to nail, initial encounter: Secondary | ICD-10-CM | POA: Insufficient documentation

## 2021-10-12 DIAGNOSIS — I872 Venous insufficiency (chronic) (peripheral): Secondary | ICD-10-CM | POA: Insufficient documentation

## 2021-10-12 DIAGNOSIS — N184 Chronic kidney disease, stage 4 (severe): Secondary | ICD-10-CM | POA: Diagnosis not present

## 2021-10-12 DIAGNOSIS — X58XXXA Exposure to other specified factors, initial encounter: Secondary | ICD-10-CM | POA: Diagnosis not present

## 2021-10-12 DIAGNOSIS — Z7901 Long term (current) use of anticoagulants: Secondary | ICD-10-CM | POA: Diagnosis not present

## 2021-10-12 DIAGNOSIS — S91101D Unspecified open wound of right great toe without damage to nail, subsequent encounter: Secondary | ICD-10-CM

## 2021-10-12 DIAGNOSIS — I129 Hypertensive chronic kidney disease with stage 1 through stage 4 chronic kidney disease, or unspecified chronic kidney disease: Secondary | ICD-10-CM | POA: Insufficient documentation

## 2021-10-12 DIAGNOSIS — S81802A Unspecified open wound, left lower leg, initial encounter: Secondary | ICD-10-CM | POA: Diagnosis present

## 2021-10-12 DIAGNOSIS — S81802D Unspecified open wound, left lower leg, subsequent encounter: Secondary | ICD-10-CM | POA: Diagnosis not present

## 2021-10-12 DIAGNOSIS — L97822 Non-pressure chronic ulcer of other part of left lower leg with fat layer exposed: Secondary | ICD-10-CM | POA: Insufficient documentation

## 2021-10-12 DIAGNOSIS — C50919 Malignant neoplasm of unspecified site of unspecified female breast: Secondary | ICD-10-CM | POA: Insufficient documentation

## 2021-10-12 DIAGNOSIS — Z86718 Personal history of other venous thrombosis and embolism: Secondary | ICD-10-CM | POA: Diagnosis not present

## 2021-10-13 NOTE — Progress Notes (Signed)
SHAN, PADGETT ([id number]) Visit Report for 10/12/2021 Chief Complaint Document Details Patient Name: Laura Santiago, Laura Santiago. Date of Service: 10/12/2021 11:30 AM Medical Record Number: [id number] Patient Account Number: [id number] Date of Birth/Sex: Oct 31, 1946 (75 y.o. F) Treating RN: Donnamarie Poag Primary Care Provider: SYSTEM, PCP Other Clinician: Referring Provider: Randa Evens Treating Provider/Extender: Yaakov Guthrie in Treatment: 14 Information Obtained from: Patient Chief Complaint Left lower extremity wound Right toe wounds Electronic Signature(s) Signed: 10/12/2021 5:39:25 PM By: Kalman Shan DO Entered By: Kalman Shan on 10/12/2021 17:32:22 Laura Santiago ([id number]) -------------------------------------------------------------------------------- Debridement Details Patient Name: Laura Santiago. Date of Service: 10/12/2021 11:30 AM Medical Record Number: [id number] Patient Account Number: [id number] Date of Birth/Sex: 1946-04-23 (75 y.o. F) Treating RN: Donnamarie Poag Primary Care Provider: SYSTEM, PCP Other Clinician: Referring Provider: Randa Evens Treating Provider/Extender: Yaakov Guthrie in Treatment: 14 Debridement Performed for Wound #2 Right Toe Great Assessment: Performed By: Physician Kalman Shan, MD Debridement Type: Debridement Level of Consciousness (Pre- Awake and Alert procedure): Pre-procedure Verification/Time Out Yes - 11:51 Taken: Start Time: 11:51 Pain Control: Lidocaine Total Area Debrided (L x W): 0.2 (cm) x 0.2 (cm) = 0.04 (cm) Tissue and other material Viable, Non-Viable, Slough, Subcutaneous, Slough debrided: Level: Skin/Subcutaneous Tissue Debridement Description: Excisional Instrument: Curette Bleeding: Minimum Hemostasis Achieved: Pressure Response to Treatment: Procedure was tolerated well Level of Consciousness (Post- Awake and Alert procedure): Post Debridement Measurements of Total  Wound Length: (cm) 0.2 Width: (cm) 0.2 Depth: (cm) 0.1 Volume: (cm) 0.003 Character of Wound/Ulcer Post Debridement: Improved Post Procedure Diagnosis Same as Pre-procedure Electronic Signature(s) Signed: 10/12/2021 12:41:53 PM By: Donnamarie Poag Signed: 10/12/2021 5:39:25 PM By: Kalman Shan DO Entered By: Donnamarie Poag on 10/12/2021 11:53:52 Laura Santiago ([id number]) -------------------------------------------------------------------------------- Debridement Details Patient Name: Laura Santiago. Date of Service: 10/12/2021 11:30 AM Medical Record Number: [id number] Patient Account Number: [id number] Date of Birth/Sex: [birth year] (75 y.o. F) Treating RN: Donnamarie Poag Primary Care Provider: SYSTEM, PCP Other Clinician: Referring Provider: Randa Evens Treating Provider/Extender: Yaakov Guthrie in Treatment: 14 Debridement Performed for Wound #1 Left,Medial Lower Leg Assessment: Performed By: Physician Kalman Shan, MD Debridement Type: Debridement Severity of Tissue Pre Debridement: Fat layer exposed Level of Consciousness (Pre- Awake and Alert procedure): Pre-procedure Verification/Time Out Yes - 11:51 Taken: Start Time: 11:54 Pain Control: Lidocaine Total Area Debrided (L x W): 9 (cm) x 8 (cm) = 72 (cm) Tissue and other material Viable, Non-Viable, Slough, Subcutaneous, Biofilm, Slough debrided: Level: Skin/Subcutaneous Tissue Debridement Description: Excisional Instrument: Curette Bleeding: Minimum Hemostasis Achieved: Pressure Response to Treatment: Procedure was tolerated well Level of Consciousness (Post- Awake and Alert procedure): Post Debridement Measurements of Total Wound Length: (cm) 9 Width: (cm) 8 Depth: (cm) 0.2 Volume: (cm) 11.31 Character of Wound/Ulcer Post Debridement: Improved Severity of Tissue Post Debridement: Fat layer exposed Post Procedure Diagnosis Same as Pre-procedure Electronic Signature(s) Signed: 10/12/2021  12:41:53 PM By: Donnamarie Poag Signed: 10/12/2021 5:39:25 PM By: Kalman Shan DO Entered By: Donnamarie Poag on 10/12/2021 11:57:28 Laura Santiago ([id number]) -------------------------------------------------------------------------------- HPI Details Patient Name: Laura Santiago. Date of Service: 10/12/2021 11:30 AM Medical Record Number: [id number] Patient Account Number: [id number] Date of Birth/Sex: [birth year] (75 y.o. F) Treating RN: Donnamarie Poag Primary Care Provider: SYSTEM, PCP Other Clinician: Referring Provider: Randa Evens Treating Provider/Extender: Yaakov Guthrie in Treatment: 14 History of Present Illness HPI Description: Admission 7/27 Ms. Avaiah Stempel is a 75 year old female with a past medical history of ADHD, metastatic breast cancer, stage IV chronic kidney disease, history of  DVT on Xarelto and chronic venous insufficiency that presents to the clinic for a chronic left lower extremity wound. She recently moved to [facility] 4 days ago. She was being followed by wound care center in [name]. She reports a 10-year history of wounds to her left lower extremity that eventually do heal with debridement and compression therapy. She states that the current wound reopened 4 months ago and she is using Vaseline and Coban. She denies signs of infection. 8/3; patient presents for 1 week follow-up. She reports no issues or complaints today. She states she had vascular studies done in the last week. She denies signs of infection. She brought her little service dog with her today. 8/17; patient presents for follow-up. She has missed her last clinic appointment. She states she took the wrap off and attempted to rewrap her leg. She is having difficulty with transportation. She has her service dog with her today. Overall she feels well and reports improvement in wound healing. She denies signs of infection. She reports owning an old Velcro wrap compression and has  this at her living facility 9/14; patient presents for follow-up. Patient states that over the past 2 to 3 weeks she developed toe wounds to her right foot. She attributes this to tight fitting shoes. She subsequently developed cellulitis in the right leg and has been treated by doxycycline by her oncologist. She reports improvement in symptoms however continues to have some redness and swelling to this leg. To the left lower extremity patient has been having her wraps changed with home health twice weekly. She states that the [facility] is not helping control the drainage. Other than that she has no issues or complaints today. She denies signs of infection to the left lower extremity. 9/21; patient presents for follow-up. She reports seeing infectious disease for her cellulitis. She reports no further management. She has home health that changes the wraps twice weekly. She has no issues or complaints today. She denies signs of infection. 10/5; patient presents for follow-up. She has no issues or complaints today. She denies signs of infection. She states that the right great toe has not been dressed by home health. 10/12; patient presents for follow-up. She has no issues or complaints today. She reports improvement in her wound healing. She has been using silver alginate to the right great toe wound. She denies signs of infection. 10/26; patient presents for follow-up. Home health did not have sorbact so they continued to use Hydrofera Blue under the wrap. She has been using silver alginate to the great toe wound however she did not have a dressing in place today. She currently denies signs of infection. 11/2; patient presents for follow-up. She has been using sorb act under the compression wrap. She reports using silver alginate to the toe wound again she does not have a dressing in place. She currently denies signs of infection. Electronic Signature(s) Signed: 10/12/2021 5:39:25 PM By:  Kalman Shan DO Entered By: Kalman Shan on 10/12/2021 17:33:09 Laura Santiago ([id number]) -------------------------------------------------------------------------------- Physical Exam Details Patient Name: Laura, Santiago. Date of Service: 10/12/2021 11:30 AM Medical Record Number: [id number] Patient Account Number: [id number] Date of Birth/Sex: 01-20-1946 (76 y.o. F) Treating RN: Donnamarie Poag Primary Care Provider: SYSTEM, PCP Other Clinician: Referring Provider: Randa Evens Treating Provider/Extender: Yaakov Guthrie in Treatment: 14 Constitutional . Cardiovascular . Psychiatric . Notes Left lower extremity: Large open wound to the medial aspect with nonviable tissue and granulation tissue present. Right foot: To the  right great toe there is an open wound with circumferential callus and granulation tissue present post debridement. No signs of infection on exam Electronic Signature(s) Signed: 10/12/2021 5:39:25 PM By: Kalman Shan DO Entered By: Kalman Shan on 10/12/2021 17:33:48 Laura Santiago ([id number]) -------------------------------------------------------------------------------- Physician Orders Details Patient Name: Laura Santiago. Date of Service: 10/12/2021 11:30 AM Medical Record Number: [id number] Patient Account Number: [id number] Date of Birth/Sex: Mar 02, 1946 (75 y.o. F) Treating RN: Donnamarie Poag Primary Care Provider: SYSTEM, PCP Other Clinician: Referring Provider: Randa Evens Treating Provider/Extender: Yaakov Guthrie in Treatment: 42 Verbal / Phone Orders: No Diagnosis Coding Follow-up Appointments o Return Appointment in 1 week. o Nurse Visit as needed [facility] for wound care. May utilize formulary equivalent dressing for wound treatment orders unless otherwise specified. Home Health Nurse may visit PRN to address patientos wound care  needs. o Scheduled days for dressing changes to be completed; exception, patient has scheduled wound care visit that day. - Twice a week, patient comes to office on Wednesday o **Please direct any NON-WOUND related issues/requests for orders to patient's Primary Care Physician. **If current dressing causes regression in wound condition, may D/C ordered dressing product/s and apply Normal Saline Moist Dressing daily until next [place] or Other MD appointment. **Notify [facility] of regression in wound condition at [phone]. Bathing/ Shower/ Hygiene o May shower with wound dressing protected with water repellent cover or cast protector. o No tub bath. Edema Control - Lymphedema / Segmental Compressive Device / Other o Elevate leg(s) parallel to the floor when sitting. o DO YOUR BEST to sleep in the bed at night. DO NOT sleep in your recliner. Long hours of sitting in a recliner leads to swelling of the legs and/or potential wounds on your backside. Additional Orders / Instructions o Follow Nutritious Diet and Increase Protein Intake Wound Treatment Wound #1 - Lower Leg Wound Laterality: Left, Medial Cleanser: Soap and Water 3 x Per Week/30 Days Discharge Instructions: Gently cleanse wound with antibacterial soap, rinse and pat dry prior to dressing wounds Peri-Wound Care: Desitin Maximum Strength Ointment 4 (oz) 3 x Per Week/30 Days Discharge Instructions: Apply periwound Primary Dressing: Cutimed Sorbact 1.5x 2.38 (in/in) 3 x Per Week/30 Days Discharge Instructions: A bacteria- and fungi binding wound dressing, suitable for cavities and fistulas. It is suitable as a wound filler and allows the passage of wound exudate into a secondary dressing. The dressing helps reducing odor and pain and can improve healing. Secondary Dressing: Xtrasorb Large 6x9 (in/in) 3 x Per Week/30 Days Discharge Instructions: Apply to wound as directed. Do not  cut. Compression Wrap: Medichoice 4 layer Compression System, 35-40 mmHG 3 x Per Week/30 Days Discharge Instructions: Apply multi-layer wrap as directed. Wound #2 - Toe Great Wound Laterality: Right Cleanser: Wound Cleanser 1 x Per Day/30 Days Discharge Instructions: Wash your hands with soap and water. Remove old dressing, discard into plastic bag and place into trash. Cleanse the wound with Wound Cleanser prior to applying a clean dressing using gauze sponges, not tissues or cotton balls. Do not scrub or use excessive force. Pat dry using gauze sponges, not tissue or cotton balls. Primary Dressing: Silvercel Small 2x2 (in/in) 1 x Per Day/30 Days Discharge Instructions: Apply Silvercel Small 2x2 (in/in) as instructed Secondary Dressing: Gauze 1 x Per Day/30 Days SHOUA, ULLOA ([id number]) Discharge Instructions: Cover with dry gauze Secured With: 45M Medipore H Soft Cloth Surgical Tape, 2x2 (in/yd)  1 x Per Day/30 Days Discharge Instructions: Secure dressing Electronic Signature(s) Signed: 10/12/2021 12:41:53 PM By: Donnamarie Poag Signed: 10/12/2021 5:39:25 PM By: Kalman Shan DO Entered By: Donnamarie Poag on 10/12/2021 12:00:26 Laura Santiago ([id number]) -------------------------------------------------------------------------------- Problem List Details Patient Name: Laura, Santiago. Date of Service: 10/12/2021 11:30 AM Medical Record Number: [id number] Patient Account Number: [id number] Date of Birth/Sex: 14-Jan-1946 (75 y.o. F) Treating RN: Donnamarie Poag Primary Care Provider: SYSTEM, PCP Other Clinician: Referring Provider: Randa Evens Treating Provider/Extender: Yaakov Guthrie in Treatment: 14 Active Problems ICD-10 Encounter Code Description Active Date MDM Diagnosis S81.802D Unspecified open wound, left lower leg, subsequent encounter 07/13/2021 No Yes S91.101D Unspecified open wound of right great toe without damage to nail, 08/31/2021 No Yes subsequent  encounter I87.2 Venous insufficiency (chronic) (peripheral) 07/06/2021 No Yes 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 07/06/2021 No Yes I10 Essential (primary) hypertension 07/06/2021 No Yes Z79.01 Long term (current) use of anticoagulants 07/06/2021 No Yes Inactive Problems ICD-10 Code Description Active Date Inactive Date S81.802A Unspecified open wound, left lower leg, initial encounter 07/06/2021 07/06/2021 S91.101A Unspecified open wound of right great toe without damage to nail, initial 08/24/2021 08/24/2021 encounter S91.104A Unspecified open wound of right lesser toe(s) without damage to nail, initial 08/24/2021 08/24/2021 encounter Resolved Problems ICD-10 Code Description Active Date Resolved Date S91.104D Unspecified open wound of right lesser toe(s) without damage to nail, 08/31/2021 08/31/2021 subsequent encounter MELAYSIA, STREED ([id number]) Electronic Signature(s) Signed: 10/12/2021 5:39:25 PM By: Kalman Shan DO Entered By: Kalman Shan on 10/12/2021 17:31:01 Laura Santiago ([id number]) -------------------------------------------------------------------------------- Progress Note Details Patient Name: Laura Santiago. Date of Service: 10/12/2021 11:30 AM Medical Record Number: [id number] Patient Account Number: [id number] Date of Birth/Sex: December 01, 1946 (75 y.o. F) Treating RN: Donnamarie Poag Primary Care Provider: SYSTEM, PCP Other Clinician: Referring Provider: Randa Evens Treating Provider/Extender: Yaakov Guthrie in Treatment: 14 Subjective Chief Complaint Information obtained from Patient Left lower extremity wound Right toe wounds History of Present Illness (HPI) Admission 7/27 Ms. Adriane Gabbert is a 75 year old female with a past medical history of ADHD, metastatic breast cancer, stage IV chronic kidney disease, history of DVT on Xarelto and chronic venous insufficiency that presents to the clinic for a chronic left lower extremity wound. She  recently moved to [facility] 4 days ago. She was being followed by wound care center in [name]. She reports a 10-year history of wounds to her left lower extremity that eventually do heal with debridement and compression therapy. She states that the current wound reopened 4 months ago and she is using Vaseline and Coban. She denies signs of infection. 8/3; patient presents for 1 week follow-up. She reports no issues or complaints today. She states she had vascular studies done in the last week. She denies signs of infection. She brought her little service dog with her today. 8/17; patient presents for follow-up. She has missed her last clinic appointment. She states she took the wrap off and attempted to rewrap her leg. She is having difficulty with transportation. She has her service dog with her today. Overall she feels well and reports improvement in wound healing. She denies signs of infection. She reports owning an old Velcro wrap compression and has this at her living facility 9/14; patient presents for follow-up. Patient states that over the past 2 to 3 weeks she developed toe wounds to her right foot. She attributes this to tight fitting shoes. She subsequently developed cellulitis in the right leg and has been treated by doxycycline by  her oncologist. She reports improvement in symptoms however continues to have some redness and swelling to this leg. To the left lower extremity patient has been having her wraps changed with home health twice weekly. She states that the [facility] is not helping control the drainage. Other than that she has no issues or complaints today. She denies signs of infection to the left lower extremity. 9/21; patient presents for follow-up. She reports seeing infectious disease for her cellulitis. She reports no further management. She has home health that changes the wraps twice weekly. She has no issues or complaints today. She denies signs of  infection. 10/5; patient presents for follow-up. She has no issues or complaints today. She denies signs of infection. She states that the right great toe has not been dressed by home health. 10/12; patient presents for follow-up. She has no issues or complaints today. She reports improvement in her wound healing. She has been using silver alginate to the right great toe wound. She denies signs of infection. 10/26; patient presents for follow-up. Home health did not have sorbact so they continued to use Hydrofera Blue under the wrap. She has been using silver alginate to the great toe wound however she did not have a dressing in place today. She currently denies signs of infection. 11/2; patient presents for follow-up. She has been using sorb act under the compression wrap. She reports using silver alginate to the toe wound again she does not have a dressing in place. She currently denies signs of infection. Patient History Information obtained from Patient. Social History Never smoker. Medical History Eyes Denies history of Cataracts, Glaucoma, Optic Neuritis Ear/Nose/Mouth/Throat Denies history of Chronic sinus problems/congestion, Middle ear problems Hematologic/Lymphatic Denies history of Anemia, Hemophilia, Human Immunodeficiency Virus, Lymphedema, Sickle Cell Disease Respiratory Denies history of Aspiration, Asthma, Chronic Obstructive Pulmonary Disease (COPD), Pneumothorax, Sleep Apnea, Tuberculosis Cardiovascular Patient has history of Hypertension Denies history of Angina, Arrhythmia, Congestive Heart Failure, Coronary Artery Disease, Deep Vein Thrombosis, Hypotension, Myocardial Infarction, Peripheral Arterial Disease, Peripheral Venous Disease, Phlebitis, Vasculitis Gastrointestinal Denies history of Cirrhosis , Colitis, Crohn s, Hepatitis A, Hepatitis B, Hepatitis C Endocrine Denies history of Type I Diabetes, Type II Diabetes Genitourinary LAKECHIA, NAY  ([id number]) Denies history of End Stage Renal Disease Immunological Denies history of Lupus Erythematosus, Raynaud s, Scleroderma Integumentary (Skin) Denies history of History of Burn, History of pressure wounds Musculoskeletal Patient has history of Osteoarthritis Denies history of Gout, Rheumatoid Arthritis, Osteomyelitis Oncologic Patient has history of Received Chemotherapy, Received Radiation Medical And Surgical History Notes Oncologic breast cancer Objective Constitutional Vitals Time Taken: 11:35 AM, Height: 66 in, Weight: 153 lbs, BMI: 24.7, Temperature: 98.3 F, Pulse: 83 bpm, Respiratory Rate: 16 breaths/min, Blood Pressure: 143/68 mmHg. General Notes: Left lower extremity: Large open wound to the medial aspect with nonviable tissue and granulation tissue present. Right foot: To the right great toe there is an open wound with circumferential callus and granulation tissue present post debridement. No signs of infection on exam Integumentary (Hair, Skin) Wound #1 status is Open. Original cause of wound was Gradually Appeared. The date acquired was: 04/06/2021. The wound has been in treatment 14 weeks. The wound is located on the Left,Medial Lower Leg. The wound measures 9cm length x 8cm width x 0.2cm depth; 56.549cm^2 area and 11.31cm^3 volume. There is Fat Layer (Subcutaneous Tissue) exposed. There is no tunneling or undermining noted. There is a large amount of serosanguineous drainage noted. There is small (1-33%) red granulation within the wound  bed. There is a large (67-100%) amount of necrotic tissue within the wound bed including Adherent Slough. Wound #2 status is Open. Original cause of wound was Gradually Appeared. The date acquired was: 08/24/2021. The wound has been in treatment 7 weeks. The wound is located on the Right Toe Great. The wound measures 0.2cm length x 0.2cm width x 0.1cm depth; 0.031cm^2 area and 0.003cm^3 volume. There is Fat Layer (Subcutaneous  Tissue) exposed. There is no tunneling or undermining noted. There is a medium amount of serous drainage noted. The wound margin is thickened. There is large (67-100%) red granulation within the wound bed. There is a small (1-33%) amount of necrotic tissue within the wound bed including Adherent Slough. Assessment Active Problems ICD-10 Unspecified open wound, left lower leg, subsequent encounter Unspecified open wound of right great toe without damage to nail, subsequent encounter Venous insufficiency (chronic) (peripheral) Secondary malignant neoplasm of breast Essential (primary) hypertension Long term (current) use of anticoagulants Patient's wounds are stable. I debrided nonviable tissue. I recommended open toed shoes to help with her right great toe wound. For the left lower extremity wound I recommended stopping gentamicin cream and just using sorbact under compression. No obvious signs of infection on exam. Follow-up in 1 week REWA, [place]. 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 There was a Excisional Skin/Subcutaneous Tissue Debridement with a total area of 72 sq cm performed by Kalman Shan, MD. With the following instrument(s): Curette to remove Viable and Non-Viable tissue/material. Material removed includes Subcutaneous Tissue, Slough, and Biofilm after achieving pain control using Lidocaine. A time out was conducted at 11:51, prior to the start of the procedure. A Minimum amount of bleeding was controlled with Pressure. The procedure was tolerated well. Post Debridement Measurements: 9cm length x 8cm width x 0.2cm depth; 11.31cm^3 volume. Character of Wound/Ulcer Post Debridement is improved. Severity of Tissue Post Debridement is: Fat layer exposed. Post procedure Diagnosis Wound #1: Same as Pre-Procedure Pre-procedure  diagnosis of Wound #1 is a Venous Leg Ulcer located on the Left,Medial Lower Leg . There was a Three Layer Compression Therapy Procedure by Donnamarie Poag, RN. 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 There was a Excisional Skin/Subcutaneous Tissue Debridement with a total area of 0.04 sq cm performed by Kalman Shan, MD. With the following instrument(s): Curette to remove Viable and Non-Viable tissue/material. Material removed includes Subcutaneous Tissue and Slough and after achieving pain control using Lidocaine. A time out was conducted at 11:51, prior to the start of the procedure. A Minimum amount of bleeding was controlled with Pressure. The procedure was tolerated well. Post Debridement Measurements: 0.2cm length x 0.2cm width x 0.1cm depth; 0.003cm^3 volume. Character of Wound/Ulcer Post Debridement is improved. Post procedure Diagnosis Wound #2: Same as Pre-Procedure Plan Follow-up Appointments: Return Appointment in 1 week. Nurse Visit as needed Home Health: [place]: - [facility] for wound care. May utilize formulary equivalent dressing for wound treatment orders unless otherwise specified. Home Health Nurse may visit PRN to address patient s wound care needs. Scheduled days for dressing changes to be completed; exception, patient has scheduled wound care visit that day. - Twice a week, patient comes to office on Wednesday **Please direct any NON-WOUND related issues/requests for orders to  patient's Primary Care Physician. **If current dressing causes regression in wound condition, may D/C ordered dressing product/s and apply Normal Saline Moist Dressing daily until next [place] or Other MD appointment. **Notify [facility] of regression in wound condition at [phone]. Bathing/ Shower/  Hygiene: May shower with wound dressing protected with water repellent cover or cast protector. No tub bath. Edema Control - Lymphedema / Segmental Compressive Device / Other: Elevate leg(s) parallel to the floor when sitting. DO YOUR BEST to sleep in the bed at night. DO NOT sleep in your recliner. Long hours of sitting in a recliner leads to swelling of the legs and/or potential wounds on your backside. Additional Orders / Instructions: Follow Nutritious Diet and Increase Protein Intake WOUND #1: - Lower Leg Wound Laterality: Left, Medial Cleanser: Soap and Water 3 x Per Week/30 Days Discharge Instructions: Gently cleanse wound with antibacterial soap, rinse and pat dry prior to dressing wounds Peri-Wound Care: Desitin Maximum Strength Ointment 4 (oz) 3 x Per Week/30 Days Discharge Instructions: Apply periwound Primary Dressing: Cutimed Sorbact 1.5x 2.38 (in/in) 3 x Per Week/30 Days Discharge Instructions: A bacteria- and fungi binding wound dressing, suitable for cavities and fistulas. It is suitable as a wound filler and allows the passage of wound exudate into a secondary dressing. The dressing helps reducing odor and pain and can improve healing. Secondary Dressing: Xtrasorb Large 6x9 (in/in) 3 x Per Week/30 Days Discharge Instructions: Apply to wound as directed. Do not cut. Compression Wrap: Medichoice 4 layer Compression System, 35-40 mmHG 3 x Per Week/30 Days Discharge Instructions: Apply multi-layer wrap as directed. WOUND #2: - Toe Great Wound Laterality: Right Cleanser: Wound Cleanser 1 x Per Day/30 Days Discharge Instructions: Wash your hands with soap and water. Remove old dressing, discard into plastic bag and place into trash. Cleanse the wound with Wound Cleanser prior to applying a clean dressing using gauze sponges, not tissues or cotton balls. Do not scrub or use excessive force. Pat dry using gauze sponges, not tissue or cotton balls. Primary Dressing: Silvercel  Small 2x2 (in/in) 1 x Per Day/30 Days Discharge Instructions: Apply Silvercel Small 2x2 (in/in) as instructed Secondary Dressing: Gauze 1 x Per Day/30 Days Laura, Santiago ([id number]) Discharge Instructions: Cover with dry gauze Secured With: 91M Medipore H Soft Cloth Surgical Tape, 2x2 (in/yd) 1 x Per Day/30 Days Discharge Instructions: Secure dressing 1. In office sharp debridement 2. Sorb act under compression 3. Silver alginate to the right great toe wound 4. Follow-up in 1 week Electronic Signature(s) Signed: 10/12/2021 5:39:25 PM By: Kalman Shan DO Entered By: Kalman Shan on 10/12/2021 17:37:32 Laura Santiago ([id number]) -------------------------------------------------------------------------------- ROS/PFSH Details Patient Name: Laura Santiago. Date of Service: 10/12/2021 11:30 AM Medical Record Number: [id number] Patient Account Number: [id number] Date of Birth/Sex: December 25, 1945 (75 y.o. F) Treating RN: Donnamarie Poag Primary Care Provider: SYSTEM, PCP Other Clinician: Referring Provider: Randa Evens Treating Provider/Extender: Yaakov Guthrie in Treatment: 14 Information Obtained From Patient Eyes Medical History: Negative for: Cataracts; Glaucoma; Optic Neuritis Ear/Nose/Mouth/Throat Medical History: Negative for: Chronic sinus problems/congestion; Middle ear problems Hematologic/Lymphatic Medical History: Negative for: Anemia; Hemophilia; Human Immunodeficiency Virus; Lymphedema; Sickle Cell Disease Respiratory Medical History: Negative for: Aspiration; Asthma; Chronic Obstructive Pulmonary Disease (COPD); Pneumothorax; Sleep Apnea; Tuberculosis Cardiovascular Medical History: Positive for: Hypertension Negative for: Angina; Arrhythmia; Congestive Heart Failure; Coronary Artery Disease; Deep Vein Thrombosis; Hypotension; Myocardial Infarction; Peripheral Arterial Disease; Peripheral Venous Disease; Phlebitis;  Vasculitis Gastrointestinal Medical History: Negative for: Cirrhosis ; Colitis; Crohnos; Hepatitis  A; Hepatitis B; Hepatitis C Endocrine Medical History: Negative for: Type I Diabetes; Type II Diabetes Genitourinary Medical History: Negative for: End Stage Renal Disease Immunological Medical History: Negative for: Lupus Erythematosus; Raynaudos; Scleroderma Integumentary (Skin) Medical History: Negative for: History of Burn; History of pressure wounds Musculoskeletal Medical History: Positive for: Osteoarthritis KARIANA, WILES ([id number]) Negative for: Gout; Rheumatoid Arthritis; Osteomyelitis Oncologic Medical History: Positive for: Received Chemotherapy; Received Radiation Past Medical History Notes: breast cancer Immunizations Pneumococcal Vaccine: Received Pneumococcal Vaccination: No Implantable Devices None Family and Social History Never smoker Electronic Signature(s) Signed: 10/12/2021 5:39:25 PM By: Kalman Shan DO Signed: 10/13/2021 4:28:15 PM By: Donnamarie Poag Entered By: Kalman Shan on 10/12/2021 17:33:17 Laura Santiago ([id number]) -------------------------------------------------------------------------------- [place] Details Patient Name: Laura Santiago. Date of Service: 10/12/2021 Medical Record Number: [id number] Patient Account Number: [id number] Date of Birth/Sex: 1945-12-31 (75 y.o. 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) 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 I10 Essential (primary) hypertension Z79.01 Long term (current)  use of anticoagulants Facility Procedures CPT4 Code: 65993570 Description: 17793 - DEB SUBQ TISSUE 20 SQ CM/< Modifier: Quantity: 1 CPT4 Code: Description: ICD-10 Diagnosis Description S91.101D Unspecified open wound of right great toe without damage to nail, subseq S81.802D Unspecified open wound, left lower leg, subsequent encounter Modifier: uent encounter Quantity: CPT4 Code: 90300923 Description: 30076 - DEB SUBQ TISS EA ADDL 20CM Modifier: Quantity: 3 CPT4 Code: Description: ICD-10 Diagnosis Description S81.802D Unspecified open wound, left lower leg, subsequent encounter S91.101D Unspecified open wound of right great toe without damage to nail, subseq Modifier: uent encounter Quantity: Physician Procedures CPT4 Code: 2263335 Description: 45625 - WC PHYS SUBQ TISS 20 SQ CM Modifier: Quantity: 1 CPT4 Code: Description: ICD-10 Diagnosis Description S91.101D Unspecified open wound of right great toe without damage to nail, subseq S81.802D Unspecified open wound, left lower leg, subsequent encounter Modifier: uent encounter Quantity: CPT4 Code: 6389373 Description: 42876 - WC PHYS SUBQ TISS EA ADDL 20 CM Modifier: Quantity: 3 CPT4 Code: Description: ICD-10 Diagnosis Description S81.802D Unspecified open wound, left lower leg, subsequent encounter S91.101D Unspecified open wound of right great toe without damage to nail, subseq Modifier: uent encounter Quantity: Electronic Signature(s) Signed: 10/12/2021 5:39:25 PM By: Kalman Shan DO Previous Signature: 10/12/2021 12:41:53 PM Version By: Donnamarie Poag Entered By: Kalman Shan on 10/12/2021 17:37:41

## 2021-10-13 NOTE — Progress Notes (Signed)
Laura Santiago, Laura Santiago ([id number]) Visit Report for 10/12/2021 Arrival Information Details Patient Name: Laura Santiago, Laura Santiago. Date of Service: 10/12/2021 11:30 AM Medical Record Number: [id number] Patient Account Number: [id number] Date of Birth/Sex: 05/30/1946 (75 y.o. F) Treating RN: Donnamarie Poag Primary Care Paxtyn Wisdom: SYSTEM, PCP Other Clinician: Referring Faithe Ariola: Randa Evens Treating Shaan Rhoads/Extender: Yaakov Guthrie in Treatment: 14 Visit Information History Since Last Visit Added or deleted any medications: No Patient Arrived: Laura Santiago Had a fall or experienced change in No Arrival Time: 11:34 activities of daily living that may affect Accompanied By: dog risk of falls: Transfer Assistance: None Hospitalized since last visit: No Patient Identification Verified: Yes Has Dressing in Place as Prescribed: Yes Secondary Verification Process Completed: Yes Has Compression in Place as Prescribed: Yes Patient Has Alerts: Yes Pain Present Now: Yes Patient Alerts: PT [place] Signature(s) Signed: 10/12/2021 12:41:53 PM By: Donnamarie Poag Entered By: Donnamarie Poag on 10/12/2021 11:36:08 Laura Santiago ([id number]) -------------------------------------------------------------------------------- Clinic Level of Care Assessment Details Patient Name: Laura Santiago. Date of Service: 10/12/2021 11:30 AM Medical Record Number: [id number] Patient Account Number: [id number] Date of Birth/Sex: 08-29-1946 (75 y.o. F) Treating RN: Donnamarie Poag Primary Care Maxx Calaway: SYSTEM, PCP Other Clinician: Referring Vaneta Hammontree: Randa Evens Treating Margorie Renner/Extender: Yaakov Guthrie in Treatment: 14 Clinic Level of Care Assessment Items TOOL 1 Quantity Score _0  - Use when EandM and Procedure is performed on INITIAL visit 0 ASSESSMENTS - Nursing Assessment / Reassessment _1  - General Physical Exam (combine w/ comprehensive assessment (listed just below) when performed on  new 0 pt. evals) _2  - 0 Comprehensive Assessment (HX, ROS, Risk Assessments, Wounds Hx, etc.) ASSESSMENTS - Wound and Skin Assessment / Reassessment _3  - Dermatologic / Skin Assessment (not related to wound area) 0 ASSESSMENTS - Ostomy and/or Continence Assessment and Care _4  - Incontinence Assessment and Management 0 _5  - 0 Ostomy Care Assessment and Management (repouching, etc.) PROCESS - Coordination of Care _6  - Simple Patient / Family Education for ongoing care 0 _7  - 0 Complex (extensive) Patient / Family Education for ongoing care _8  - 0 Staff obtains Programmer, systems, Records, Test Results / Process Orders _9  - 0 Staff telephones HHA, Nursing Homes / Clarify orders / etc _10  - 0 Routine Transfer to another Facility (non-emergent condition) _11  - 0 Routine Hospital Admission (non-emergent condition) _12  - 0 New Admissions / Biomedical engineer / Ordering NPWT, Apligraf, etc. _13  - 0 Emergency Hospital Admission (emergent condition) PROCESS - Special Needs _14  - Pediatric / Minor Patient Management 0 _15  - 0 Isolation Patient Management _16  - 0 Hearing / Language / Visual special needs _17  - 0 Assessment of Community assistance (transportation, D/C planning, etc.) _18  - 0 Additional assistance / Altered mentation _19  - 0 Support Surface(s) Assessment (bed, cushion, seat, etc.) INTERVENTIONS - Miscellaneous _20  - External ear exam 0 _21  - 0 Patient Transfer (multiple staff / Civil Service fast streamer / Similar devices) _22  - 0 Simple Staple / Suture removal (25 or less) _23  - 0 Complex Staple / Suture removal (26 or more) _24  - 0 Hypo/Hyperglycemic Management (do not check if billed separately) _25  - 0 Ankle / Brachial Index (ABI) - do not check if billed separately Has the patient been seen at the hospital within the last three years: Yes Total Score: 0 Level Of Care: ____ Laura Santiago ([id number]) Electronic Signature(s) Signed: 10/12/2021 12:41:53 PM By: Donnamarie Poag Entered By:  Donnamarie Poag on 10/12/2021 11:58:16 Laura Santiago ([id number]) -------------------------------------------------------------------------------- Complex / Palliative Patient Assessment Details Patient Name:  Laura Santiago, Laura M. Date of Service: 10/12/2021 11:30 AM Medical Record Number: [id number] Patient Account Number: [id number] Date of Birth/Sex: Jun 13, 1946 (75 y.o. F) Treating RN: Donnamarie Poag Primary Care Jedrek Dinovo: SYSTEM, PCP Other Clinician: Referring Philander Ake: Randa Evens Treating Satvik Parco/Extender: Yaakov Guthrie in Treatment: 14 Palliative Management Criteria Complex Wound Management Criteria Patient has remarkable or complex co-morbidities requiring medications or treatments that extend wound healing times. Examples: o Diabetes mellitus with chronic renal failure or end stage renal disease requiring dialysis o Advanced or poorly controlled rheumatoid arthritis o Diabetes mellitus and end stage chronic obstructive pulmonary disease o Active cancer with current chemo- or radiation therapy cancer treatment active Care Approach Wound Care Plan: Complex Wound Management Electronic Signature(s) Signed: 10/12/2021 12:41:53 PM By: Donnamarie Poag Signed: 10/12/2021 5:39:25 PM By: Kalman Shan DO Entered By: Donnamarie Poag on 10/12/2021 11:44:04 Laura Santiago ([id number]) -------------------------------------------------------------------------------- Compression Therapy Details Patient Name: Laura Santiago. Date of Service: 10/12/2021 11:30 AM Medical Record Number: [id number] Patient Account Number: [id number] Date of Birth/Sex: 1946/09/19 (75 y.o. F) Treating RN: Donnamarie Poag Primary Care Imagine Nest: SYSTEM, PCP Other Clinician: Referring Greysin Medlen: Randa Evens Treating Atlantis Delong/Extender: Yaakov Guthrie in Treatment: 14 Compression Therapy Performed for Wound Assessment: Wound #1 Left,Medial Lower Leg Performed By: Clinician Donnamarie Poag, RN Compression  Type: Three Layer Post Procedure Diagnosis Same as Pre-procedure Electronic Signature(s) Signed: 10/12/2021 12:41:53 PM By: Donnamarie Poag Entered By: Donnamarie Poag on 10/12/2021 11:51:31 Laura Santiago ([id number]) -------------------------------------------------------------------------------- Encounter Discharge Information Details Patient Name: Laura Santiago. Date of Service: 10/12/2021 11:30 AM Medical Record Number: [id number] Patient Account Number: [id number] Date of Birth/Sex: April 06, 1946 (75 y.o. F) Treating RN: Donnamarie Poag Primary Care Shalese Strahan: SYSTEM, PCP Other Clinician: Referring Erandy Mceachern: Randa Evens Treating Makhai Fulco/Extender: Yaakov Guthrie in Treatment: 14 Encounter Discharge Information Items Post Procedure Vitals Discharge Condition: Stable Temperature (F): 98.3 Ambulatory Status: Walker Pulse (bpm): 83 Discharge Destination: Home Respiratory Rate (breaths/min): 16 Transportation: Private Auto Blood Pressure (mmHg): 143/68 Accompanied By: dog Schedule Follow-up Appointment: Yes Clinical Summary of Care: Electronic Signature(s) Signed: 10/12/2021 12:41:53 PM By: Donnamarie Poag Entered By: Donnamarie Poag on 10/12/2021 12:11:52 Laura Santiago ([id number]) -------------------------------------------------------------------------------- Lower Extremity Assessment Details Patient Name: Laura Santiago. Date of Service: 10/12/2021 11:30 AM Medical Record Number: [id number] Patient Account Number: [id number] Date of Birth/Sex: 06/05/1946 (75 y.o. F) Treating RN: Donnamarie Poag Primary Care Kaytlin Burklow: SYSTEM, PCP Other Clinician: Referring Bryson Gavia: Randa Evens Treating Lillyauna Jenkinson/Extender: Yaakov Guthrie in Treatment: 14 Edema Assessment Assessed: [Left: Yes] [Right: Yes] Edema: [Left: No] [Right: Yes] Calf Left: Right: Point of Measurement: 29 cm From Medial Instep 35.5 cm Ankle Left: Right: Point of Measurement: 10 cm From Medial Instep 22  cm Vascular Assessment Pulses: Dorsalis Pedis Palpable: [Left:Yes] [Right:Yes] Electronic Signature(s) Signed: 10/12/2021 12:41:53 PM By: Donnamarie Poag Entered By: Donnamarie Poag on 10/12/2021 11:42:37 Laura Santiago ([id number]) -------------------------------------------------------------------------------- Multi Wound Chart Details Patient Name: Laura Santiago. Date of Service: 10/12/2021 11:30 AM Medical Record Number: [id number] Patient Account Number: [id number] Date of Birth/Sex: 03-May-1946 (75 y.o. 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 Comorbid History: Hypertension, Osteoarthritis, Hypertension, Osteoarthritis, N/A Received Chemotherapy,  Received Received Chemotherapy, Received Radiation Radiation Date Acquired: 04/06/2021 08/24/2021 N/A Weeks of Treatment: 14 7 N/A Wound Status: Open Open N/A Clustered Wound: Yes No N/A Clustered Quantity: 2 N/A N/A Measurements L x W x D (cm) 9x8x0.2 0.2x0.2x0.1 N/A Area (cm) : 56.549 0.031 N/A Volume (cm) : 11.31 0.003 N/A % Reduction in Area: 37.40% 80.30% N/A % Reduction in Volume: 37.40% 95.20% N/A Classification: Full Thickness Without Exposed Full Thickness Without Exposed N/A Support Structures Support Structures Exudate Amount: Large Medium N/A Exudate Type: Serosanguineous Serous N/A Exudate Color: red, brown amber N/A Wound Margin: N/A Thickened N/A Granulation Amount: Small (1-33%) Large (67-100%)  N/A Granulation Quality: Red Red N/A Necrotic Amount: Large (67-100%) Small (1-33%) N/A Exposed Structures: Fat Layer (Subcutaneous Tissue): Fat Layer (Subcutaneous Tissue): N/A Yes Yes Fascia: No Fascia: No Tendon: No Tendon: No Muscle: No Muscle: No Joint: No Joint: No Bone: No Bone: No Epithelialization: Large (67-100%) None N/A Debridement: Debridement - Excisional Debridement - Excisional N/A Pre-procedure Verification/Time 11:51 11:51 N/A Out Taken: Pain Control: Lidocaine Lidocaine N/A Tissue Debrided: Subcutaneous, Slough Subcutaneous, Slough N/A Level: Skin/Subcutaneous Tissue Skin/Subcutaneous Tissue N/A Debridement Area (sq cm): 72 0.04 N/A Instrument: Curette Curette N/A Bleeding: Minimum Minimum N/A Laura Santiago, Laura Santiago ([id number]) Hemostasis Achieved: Pressure Pressure N/A Debridement Treatment Procedure was tolerated well Procedure was tolerated well N/A Response: Post Debridement 9x8x0.2 0.2x0.2x0.1 N/A Measurements L x W x D (cm) Post Debridement Volume: 11.31 0.003 N/A (cm) Procedures Performed: Compression Therapy Debridement N/A Debridement Treatment Notes Wound #1 (Lower Leg) Wound Laterality: Left, Medial Cleanser Soap and Water Discharge Instruction: Gently cleanse wound with antibacterial soap, rinse and pat dry prior to dressing wounds Peri-Wound Care Desitin Maximum Strength Ointment 4 (oz) Discharge Instruction: Apply periwound Topical Primary Dressing Cutimed Sorbact 1.5x 2.38 (in/in) Discharge Instruction: A bacteria- and fungi binding wound dressing, suitable for cavities and fistulas. It is suitable as a wound filler and allows the passage of wound exudate into a secondary dressing. The dressing helps reducing odor and pain and can improve healing. Secondary Dressing Xtrasorb Large 6x9 (in/in) Discharge Instruction: Apply to wound as directed. Do not cut. Secured With Compression Wrap Medichoice 4 layer Compression System, 35-40  mmHG Discharge Instruction: Apply multi-layer wrap as directed. Compression Stockings Add-Ons Wound #2 (Toe Great) Wound Laterality: Right Cleanser Wound Cleanser Discharge Instruction: Wash your hands with soap and water. Remove old dressing, discard into plastic bag and place into trash. Cleanse the wound with Wound Cleanser prior to applying a clean dressing using gauze sponges, not tissues or cotton balls. Do not scrub or use excessive force. Pat dry using gauze sponges, not tissue or cotton balls. Peri-Wound Care Topical Primary Dressing Silvercel Small 2x2 (in/in) Discharge Instruction: Apply Silvercel Small 2x2 (in/in) as instructed Secondary Dressing Gauze Discharge Instruction: Cover with dry gauze Secured With 17M [place] Surgical Tape, 2x2 (in/yd) Discharge Instruction: Secure dressing Laura Santiago, Laura Santiago ([id number]) Compression Wrap Compression Stockings Add-Ons Electronic Signature(s) Signed: 10/12/2021 5:39:25 PM By: Kalman Shan DO Previous Signature: 10/12/2021 12:41:53 PM Version By: Donnamarie Poag Entered By: Kalman Shan on 10/12/2021 17:31:11 Laura Santiago ([id number]) -------------------------------------------------------------------------------- Multi-Disciplinary Care Plan Details Patient Name: Laura Santiago. Date of Service: 10/12/2021 11:30 AM Medical Record Number: [id number] Patient Account Number: [id number] Date of Birth/Sex: [birth year] (75 y.o. F) Treating RN: Donnamarie Poag Primary Care Erial Fikes: SYSTEM, PCP Other Clinician: Referring Omar Gayden: Randa Evens Treating Vinh Sachs/Extender: Yaakov Guthrie in Treatment: 14 Active Inactive Wound/Skin Impairment Nursing Diagnoses: Impaired tissue integrity Goals: Patient/caregiver  will verbalize understanding of skin care regimen Date Initiated: 07/06/2021 Date Inactivated: 07/27/2021 Target Resolution Date: 07/06/2021 Goal Status: Met Ulcer/skin breakdown will have a  volume reduction of 30% by week 4 Date Initiated: 07/06/2021 Date Inactivated: 10/12/2021 Target Resolution Date: 08/06/2021 Goal Status: Unmet Unmet Reason: cont tx Ulcer/skin breakdown will have a volume reduction of 50% by week 8 Date Initiated: 07/06/2021 Target Resolution Date: 09/06/2021 Goal Status: Active Ulcer/skin breakdown will have a volume reduction of 80% by week 12 Date Initiated: 07/06/2021 Target Resolution Date: 10/06/2021 Goal Status: Active Ulcer/skin breakdown will heal within 14 weeks Date Initiated: 07/06/2021 Target Resolution Date: 11/06/2021 Goal Status: Active Interventions: Assess patient/caregiver ability to obtain necessary supplies Assess patient/caregiver ability to perform ulcer/skin care regimen upon admission and as needed Assess ulceration(s) every visit Treatment Activities: Referred to DME [place] for dressing supplies : 07/06/2021 Skin care regimen initiated : 07/06/2021 Notes: Electronic Signature(s) Signed: 10/12/2021 12:41:53 PM By: Donnamarie Poag Entered By: Donnamarie Poag on 10/12/2021 11:44:18 Laura Santiago ([id number]) -------------------------------------------------------------------------------- Pain Assessment Details Patient Name: Laura Santiago. Date of Service: 10/12/2021 11:30 AM Medical Record Number: [id number] Patient Account Number: [id number] Date of Birth/Sex: [birth year] (75 y.o. F) Treating RN: Donnamarie Poag Primary Care Hasna Stefanik: SYSTEM, PCP Other Clinician: Referring Charvis Lightner: Randa Evens Treating Aviana Shevlin/Extender: Yaakov Guthrie in Treatment: 14 Active Problems Location of Pain Severity and Description of Pain Patient Has Paino Yes Site Locations Pain Location: Generalized Pain, Pain in Ulcers Rate the pain. Current Pain Level: 6 Pain Management and Medication Current Pain Management: Electronic Signature(s) Signed: 10/12/2021 12:41:53 PM By: Donnamarie Poag Entered By: Donnamarie Poag on 10/12/2021  11:39:24 Laura Santiago ([id number]) -------------------------------------------------------------------------------- Patient/Caregiver Education Details Patient Name: Laura Santiago. Date of Service: 10/12/2021 11:30 AM Medical Record Number: [id number] Patient Account Number: [id number] Date of Birth/Gender: 15-Dec-1945 (75 y.o. F) Treating RN: Donnamarie Poag Primary Care Physician: SYSTEM, PCP Other Clinician: Referring Physician: Randa Evens Treating Physician/Extender: Yaakov Guthrie in Treatment: 14 Education Assessment Education Provided To: Patient Education Topics Provided Basic Hygiene: Nutrition: Venous: Wound/Skin Impairment: Electronic Signature(s) Signed: 10/12/2021 12:41:53 PM By: Donnamarie Poag Entered By: Donnamarie Poag on 10/12/2021 11:45:17 Laura Santiago ([id number]) -------------------------------------------------------------------------------- Wound Assessment Details Patient Name: Laura Santiago. Date of Service: 10/12/2021 11:30 AM Medical Record Number: [id number] Patient Account Number: [id number] Date of Birth/Sex: 1946-11-30 (75 y.o. F) Treating RN: Donnamarie Poag Primary Care Joshua Soulier: SYSTEM, PCP Other Clinician: Referring Karista Aispuro: Randa Evens Treating Alston Berrie/Extender: Yaakov Guthrie in Treatment: 14 Wound Status Wound Number: 1 Primary Venous Leg Ulcer Etiology: Wound Location: Left, Medial Lower Leg Wound Status: Open Wounding Event: Gradually Appeared Comorbid Hypertension, Osteoarthritis, Received Chemotherapy, Date Acquired: 04/06/2021 History: Received Radiation Weeks Of Treatment: 14 Clustered Wound: Yes Photos Wound Measurements Length: (cm) 9 Width: (cm) 8 Depth: (cm) 0.2 Clustered Quantity: 2 Area: (cm) 56.549 Volume: (cm) 11.31 % Reduction in Area: 37.4% % Reduction in Volume: 37.4% Epithelialization: Large (67-100%) Tunneling: No Undermining: No Wound Description Classification: Full Thickness  Without Exposed Support Structu Exudate Amount: Large Exudate Type: Serosanguineous Exudate Color: red, brown res Foul Odor After Cleansing: No Slough/Fibrino Yes Wound Bed Granulation Amount: Small (1-33%) Exposed Structure Granulation Quality: Red Fascia Exposed: No Necrotic Amount: Large (67-100%) Fat Layer (Subcutaneous Tissue) Exposed: Yes Necrotic Quality: Adherent Slough Tendon Exposed: No Muscle Exposed: No Joint Exposed: No Bone Exposed: No Treatment Notes Wound #1 (Lower Leg) Wound Laterality: Left, Medial Cleanser Soap and Water Discharge Instruction: Gently cleanse wound with antibacterial soap, rinse and pat dry prior  to dressing wounds Peri-Wound Care Laura Santiago, Laura Santiago. ([id number]) Desitin Maximum Strength Ointment 4 (oz) Discharge Instruction: Apply periwound Topical Primary Dressing Cutimed Sorbact 1.5x 2.38 (in/in) Discharge Instruction: A bacteria- and fungi binding wound dressing, suitable for cavities and fistulas. It is suitable as a wound filler and allows the passage of wound exudate into a secondary dressing. The dressing helps reducing odor and pain and can improve healing. Secondary Dressing Xtrasorb Large 6x9 (in/in) Discharge Instruction: Apply to wound as directed. Do not cut. Secured With Compression Wrap Medichoice 4 layer Compression System, 35-40 mmHG Discharge Instruction: Apply multi-layer wrap as directed. Compression Stockings Add-Ons Electronic Signature(s) Signed: 10/12/2021 12:41:53 PM By: Donnamarie Poag Entered By: Donnamarie Poag on 10/12/2021 11:40:27 Laura Santiago ([id number]) -------------------------------------------------------------------------------- Wound Assessment Details Patient Name: Laura Santiago. Date of Service: 10/12/2021 11:30 AM Medical Record Number: [id number] Patient Account Number: [id number] Date of Birth/Sex: 02-Dec-1946 (75 y.o. F) Treating RN: Donnamarie Poag Primary Care Lashena Signer: SYSTEM, PCP Other  Clinician: Referring Bryelle Spiewak: Randa Evens Treating Shantoya Geurts/Extender: Yaakov Guthrie in Treatment: 14 Wound Status Wound Number: 2 Primary Neuropathic Ulcer-Non Diabetic Etiology: Wound Location: Right Toe Great Wound Status: Open Wounding Event: Gradually Appeared Comorbid Hypertension, Osteoarthritis, Received Chemotherapy, Date Acquired: 08/24/2021 History: Received Radiation Weeks Of Treatment: 7 Clustered Wound: No Photos Wound Measurements Length: (cm) 0.2 Width: (cm) 0.2 Depth: (cm) 0.1 Area: (cm) 0.031 Volume: (cm) 0.003 % Reduction in Area: 80.3% % Reduction in Volume: 95.2% Epithelialization: None Tunneling: No Undermining: No Wound Description Classification: Full Thickness Without Exposed Support Structures Wound Margin: Thickened Exudate Amount: Medium Exudate Type: Serous Exudate Color: amber Foul Odor After Cleansing: No Slough/Fibrino Yes Wound Bed Granulation Amount: Large (67-100%) Exposed Structure Granulation Quality: Red Fascia Exposed: No Necrotic Amount: Small (1-33%) Fat Layer (Subcutaneous Tissue) Exposed: Yes Necrotic Quality: Adherent Slough Tendon Exposed: No Muscle Exposed: No Joint Exposed: No Bone Exposed: No Treatment Notes Wound #2 (Toe Great) Wound Laterality: Right Cleanser Wound Cleanser Discharge Instruction: Wash your hands with soap and water. Remove old dressing, discard into plastic bag and place into trash. Cleanse the wound with Wound Cleanser prior to applying a clean dressing using gauze sponges, not tissues or cotton balls. Do not scrub or use excessive force. Pat dry using gauze sponges, not tissue or cotton balls. Laura Santiago, Laura Santiago ([id number]) Peri-Wound Care Topical Primary Dressing Silvercel Small 2x2 (in/in) Discharge Instruction: Apply Silvercel Small 2x2 (in/in) as instructed Secondary Dressing Gauze Discharge Instruction: Cover with dry gauze Secured With 21M [place] Surgical  Tape, 2x2 (in/yd) Discharge Instruction: Secure dressing Compression Wrap Compression Stockings Add-Ons Electronic Signature(s) Signed: 10/12/2021 12:41:53 PM By: Donnamarie Poag Entered By: Donnamarie Poag on 10/12/2021 11:41:13 Laura Santiago ([id number]) -------------------------------------------------------------------------------- Vitals Details Patient Name: Laura Santiago. Date of Service: 10/12/2021 11:30 AM Medical Record Number: [id number] Patient Account Number: [id number] Date of Birth/Sex: 1946-09-20 (75 y.o. F) Treating RN: Donnamarie Poag Primary Care Meerab Maselli: SYSTEM, PCP Other Clinician: Referring Leotha Westermeyer: Randa Evens Treating Reshawn Ostlund/Extender: Yaakov Guthrie in Treatment: 14 Vital Signs Time Taken: 11:35 Temperature (F): 98.3 Height (in): 66 Pulse (bpm): 83 Weight (lbs): 153 Respiratory Rate (breaths/min): 16 Body Mass Index (BMI): 24.7 Blood Pressure (mmHg): 143/68 Reference Range: 80 - 120 mg / dl Electronic Signature(s) Signed: 10/12/2021 12:41:53 PM By: Donnamarie Poag Entered ByDonnamarie Poag on 10/12/2021 11:39:13

## 2021-10-14 ENCOUNTER — Inpatient Hospital Stay: Payer: Medicare Other

## 2021-10-14 ENCOUNTER — Inpatient Hospital Stay: Payer: Medicare Other | Attending: Oncology

## 2021-10-14 ENCOUNTER — Other Ambulatory Visit: Payer: Self-pay

## 2021-10-14 ENCOUNTER — Other Ambulatory Visit: Payer: Self-pay | Admitting: Oncology

## 2021-10-14 ENCOUNTER — Encounter: Payer: Self-pay | Admitting: Oncology

## 2021-10-14 VITALS — BP 125/68 | HR 72 | Temp 97.4°F | Resp 18 | Wt 158.8 lb

## 2021-10-14 DIAGNOSIS — C7951 Secondary malignant neoplasm of bone: Secondary | ICD-10-CM | POA: Insufficient documentation

## 2021-10-14 DIAGNOSIS — Z5111 Encounter for antineoplastic chemotherapy: Secondary | ICD-10-CM | POA: Diagnosis present

## 2021-10-14 DIAGNOSIS — Z95828 Presence of other vascular implants and grafts: Secondary | ICD-10-CM

## 2021-10-14 DIAGNOSIS — C50919 Malignant neoplasm of unspecified site of unspecified female breast: Secondary | ICD-10-CM

## 2021-10-14 DIAGNOSIS — Z170 Estrogen receptor positive status [ER+]: Secondary | ICD-10-CM | POA: Insufficient documentation

## 2021-10-14 DIAGNOSIS — Z79891 Long term (current) use of opiate analgesic: Secondary | ICD-10-CM | POA: Insufficient documentation

## 2021-10-14 DIAGNOSIS — Z9221 Personal history of antineoplastic chemotherapy: Secondary | ICD-10-CM | POA: Diagnosis not present

## 2021-10-14 DIAGNOSIS — C50911 Malignant neoplasm of unspecified site of right female breast: Secondary | ICD-10-CM | POA: Diagnosis not present

## 2021-10-14 DIAGNOSIS — Z79899 Other long term (current) drug therapy: Secondary | ICD-10-CM | POA: Insufficient documentation

## 2021-10-14 DIAGNOSIS — C779 Secondary and unspecified malignant neoplasm of lymph node, unspecified: Secondary | ICD-10-CM | POA: Insufficient documentation

## 2021-10-14 DIAGNOSIS — D6481 Anemia due to antineoplastic chemotherapy: Secondary | ICD-10-CM | POA: Insufficient documentation

## 2021-10-14 DIAGNOSIS — C787 Secondary malignant neoplasm of liver and intrahepatic bile duct: Secondary | ICD-10-CM | POA: Insufficient documentation

## 2021-10-14 DIAGNOSIS — Z5112 Encounter for antineoplastic immunotherapy: Secondary | ICD-10-CM | POA: Diagnosis not present

## 2021-10-14 DIAGNOSIS — Z923 Personal history of irradiation: Secondary | ICD-10-CM | POA: Diagnosis not present

## 2021-10-14 LAB — COMPREHENSIVE METABOLIC PANEL
ALT: 13 U/L (ref 0–44)
AST: 17 U/L (ref 15–41)
Albumin: 3.7 g/dL (ref 3.5–5.0)
Alkaline Phosphatase: 61 U/L (ref 38–126)
Anion gap: 9 (ref 5–15)
BUN: 33 mg/dL — ABNORMAL HIGH (ref 8–23)
CO2: 25 mmol/L (ref 22–32)
Calcium: 9 mg/dL (ref 8.9–10.3)
Chloride: 105 mmol/L (ref 98–111)
Creatinine, Ser: 1.4 mg/dL — ABNORMAL HIGH (ref 0.44–1.00)
GFR, Estimated: 39 mL/min — ABNORMAL LOW (ref >60)
Glucose, Bld: 112 mg/dL — ABNORMAL HIGH (ref 70–99)
Potassium: 4.1 mmol/L (ref 3.5–5.1)
Sodium: 139 mmol/L (ref 135–145)
Total Bilirubin: 0.2 mg/dL — ABNORMAL LOW (ref 0.3–1.2)
Total Protein: 6.5 g/dL (ref 6.5–8.1)

## 2021-10-14 LAB — CBC WITH DIFFERENTIAL/PLATELET
Abs Immature Granulocytes: 0.06 10*3/uL (ref 0.00–0.07)
Basophils Absolute: 0.1 10*3/uL (ref 0.0–0.1)
Basophils Relative: 1 %
Eosinophils Absolute: 0.1 10*3/uL (ref 0.0–0.5)
Eosinophils Relative: 2 %
HCT: 31.3 % — ABNORMAL LOW (ref 36.0–46.0)
Hemoglobin: 9.6 g/dL — ABNORMAL LOW (ref 12.0–15.0)
Immature Granulocytes: 1 %
Lymphocytes Relative: 21 %
Lymphs Abs: 1.1 10*3/uL (ref 0.7–4.0)
MCH: 28.5 pg (ref 26.0–34.0)
MCHC: 30.7 g/dL (ref 30.0–36.0)
MCV: 92.9 fL (ref 80.0–100.0)
Monocytes Absolute: 0.4 10*3/uL (ref 0.1–1.0)
Monocytes Relative: 7 %
Neutro Abs: 3.7 10*3/uL (ref 1.7–7.7)
Neutrophils Relative %: 68 %
Platelets: 254 10*3/uL (ref 150–400)
RBC: 3.37 MIL/uL — ABNORMAL LOW (ref 3.87–5.11)
RDW: 16.1 % — ABNORMAL HIGH (ref 11.5–15.5)
WBC: 5.5 10*3/uL (ref 4.0–10.5)
nRBC: 0 % (ref 0.0–0.2)

## 2021-10-14 MED ORDER — SODIUM CHLORIDE 0.9 % IV SOLN
10.0000 mg | Freq: Once | INTRAVENOUS | Status: AC
  Administered 2021-10-14: 10 mg via INTRAVENOUS
  Filled 2021-10-14: qty 10

## 2021-10-14 MED ORDER — SODIUM CHLORIDE 0.9% FLUSH
10.0000 mL | Freq: Once | INTRAVENOUS | Status: AC
  Administered 2021-10-14: 10 mL via INTRAVENOUS
  Filled 2021-10-14: qty 10

## 2021-10-14 MED ORDER — HEPARIN SOD (PORK) LOCK FLUSH 100 UNIT/ML IV SOLN
500.0000 [IU] | Freq: Once | INTRAVENOUS | Status: AC | PRN
  Administered 2021-10-14: 500 [IU]
  Filled 2021-10-14: qty 5

## 2021-10-14 MED ORDER — SODIUM CHLORIDE 0.9 % IV SOLN
65.0000 mg/m2 | Freq: Once | INTRAVENOUS | Status: AC
  Administered 2021-10-14: 114 mg via INTRAVENOUS
  Filled 2021-10-14: qty 19

## 2021-10-14 MED ORDER — SODIUM CHLORIDE 0.9% FLUSH
10.0000 mL | INTRAVENOUS | Status: DC | PRN
  Filled 2021-10-14: qty 10

## 2021-10-14 MED ORDER — HEPARIN SOD (PORK) LOCK FLUSH 100 UNIT/ML IV SOLN
500.0000 [IU] | Freq: Once | INTRAVENOUS | Status: DC
  Filled 2021-10-14: qty 5

## 2021-10-14 MED ORDER — FAMOTIDINE 20 MG IN NS 100 ML IVPB
20.0000 mg | Freq: Once | INTRAVENOUS | Status: AC
  Administered 2021-10-14: 20 mg via INTRAVENOUS
  Filled 2021-10-14: qty 20

## 2021-10-14 MED ORDER — SODIUM CHLORIDE 0.9 % IV SOLN
Freq: Once | INTRAVENOUS | Status: AC
  Filled 2021-10-14: qty 250

## 2021-10-14 MED ORDER — DIPHENHYDRAMINE HCL 50 MG/ML IJ SOLN
50.0000 mg | Freq: Once | INTRAMUSCULAR | Status: AC
  Administered 2021-10-14: 50 mg via INTRAVENOUS
  Filled 2021-10-14: qty 1

## 2021-10-14 MED ORDER — HEPARIN SOD (PORK) LOCK FLUSH 100 UNIT/ML IV SOLN
INTRAVENOUS | Status: AC
  Filled 2021-10-14: qty 5

## 2021-10-14 NOTE — Patient Instructions (Signed)
[facility]  Discharge Instructions: Thank you for choosing [facility] to provide your oncology and hematology care.  If you have a lab appointment with the [facility], please go directly to the [facility] and check in at the registration area.  Wear comfortable clothing and clothing appropriate for easy access to any Portacath or PICC line.   We strive to give you quality time with your provider. You may need to reschedule your appointment if you arrive late (15 or more minutes).  Arriving late affects you and other patients whose appointments are after yours.  Also, if you miss three or more appointments without notifying the office, you may be dismissed from the clinic at the provider's discretion.      For prescription refill requests, have your pharmacy contact our office and allow 72 hours for refills to be completed.    Today you received the following chemotherapy and/or immunotherapy agents - paclitaxel      To help prevent nausea and vomiting after your treatment, we encourage you to take your nausea medication as directed.  BELOW ARE SYMPTOMS THAT SHOULD BE REPORTED IMMEDIATELY: *FEVER GREATER THAN 100.4 F (38 C) OR HIGHER *CHILLS OR SWEATING *NAUSEA AND VOMITING THAT IS NOT CONTROLLED WITH YOUR NAUSEA MEDICATION *UNUSUAL SHORTNESS OF BREATH *UNUSUAL BRUISING OR BLEEDING *URINARY PROBLEMS (pain or burning when urinating, or frequent urination) *BOWEL PROBLEMS (unusual diarrhea, constipation, pain near the anus) TENDERNESS IN MOUTH AND THROAT WITH OR WITHOUT PRESENCE OF ULCERS (sore throat, sores in mouth, or a toothache) UNUSUAL RASH, SWELLING OR PAIN  UNUSUAL VAGINAL DISCHARGE OR ITCHING   Items with * indicate a potential emergency and should be followed up as soon as possible or go to the Emergency Department if any problems should occur.  Please show the CHEMOTHERAPY ALERT CARD or IMMUNOTHERAPY ALERT CARD at  check-in to the Emergency Department and triage nurse.  Should you have questions after your visit or need to cancel or reschedule your appointment, please contact [facility]  [phone] and follow the prompts.  Office hours are 8:00 a.m. to 4:30 p.m. Monday - Friday. Please note that voicemails left after 4:00 p.m. may not be returned until the following business day.  We are closed weekends and major holidays. You have access to a nurse at all times for urgent questions. Please call the main number to the clinic [phone] and follow the prompts.  For any non-urgent questions, you may also contact your provider using MyChart. We now offer e-Visits for anyone 75 and older to request care online for non-urgent symptoms. For details visit [email or web address].   Also download the MyChart app! Go to the app store, search "MyChart", open the app, select [facility], and log in with your MyChart username and password.  Due to Covid, a mask is required upon entering the hospital/clinic. If you do not have a mask, one will be given to you upon arrival. For doctor visits, patients may have 1 support person aged 72 or older with them. For treatment visits, patients cannot have anyone with them due to current Covid guidelines and our immunocompromised population.

## 2021-10-14 NOTE — Telephone Encounter (Signed)
See previous note on 9/6

## 2021-10-14 NOTE — Patient Instructions (Signed)
[facility]  Discharge Instructions: Thank you for choosing [facility] to provide your oncology and hematology care.  If you have a lab appointment with the [facility], please go directly to the [facility] and check in at the registration area.  Wear comfortable clothing and clothing appropriate for easy access to any Portacath or PICC line.   We strive to give you quality time with your provider. You may need to reschedule your appointment if you arrive late (15 or more minutes).  Arriving late affects you and other patients whose appointments are after yours.  Also, if you miss three or more appointments without notifying the office, you may be dismissed from the clinic at the provider's discretion.      For prescription refill requests, have your pharmacy contact our office and allow 72 hours for refills to be completed.    Today you received the following chemotherapy and/or immunotherapy agents - paclitaxel      To help prevent nausea and vomiting after your treatment, we encourage you to take your nausea medication as directed.  BELOW ARE SYMPTOMS THAT SHOULD BE REPORTED IMMEDIATELY: *FEVER GREATER THAN 100.4 F (38 C) OR HIGHER *CHILLS OR SWEATING *NAUSEA AND VOMITING THAT IS NOT CONTROLLED WITH YOUR NAUSEA MEDICATION *UNUSUAL SHORTNESS OF BREATH *UNUSUAL BRUISING OR BLEEDING *URINARY PROBLEMS (pain or burning when urinating, or frequent urination) *BOWEL PROBLEMS (unusual diarrhea, constipation, pain near the anus) TENDERNESS IN MOUTH AND THROAT WITH OR WITHOUT PRESENCE OF ULCERS (sore throat, sores in mouth, or a toothache) UNUSUAL RASH, SWELLING OR PAIN  UNUSUAL VAGINAL DISCHARGE OR ITCHING   Items with * indicate a potential emergency and should be followed up as soon as possible or go to the Emergency Department if any problems should occur.  Please show the CHEMOTHERAPY ALERT CARD or IMMUNOTHERAPY ALERT CARD at  check-in to the Emergency Department and triage nurse.  Should you have questions after your visit or need to cancel or reschedule your appointment, please contact [facility]  [phone] and follow the prompts.  Office hours are 8:00 a.m. to 4:30 p.m. Monday - Friday. Please note that voicemails left after 4:00 p.m. may not be returned until the following business day.  We are closed weekends and major holidays. You have access to a nurse at all times for urgent questions. Please call the main number to the clinic [phone] and follow the prompts.  For any non-urgent questions, you may also contact your provider using MyChart. We now offer e-Visits for anyone 77 and older to request care online for non-urgent symptoms. For details visit [email or web address].   Also download the MyChart app! Go to the app store, search "MyChart", open the app, select [facility], and log in with your MyChart username and password.  Due to Covid, a mask is required upon entering the hospital/clinic. If you do not have a mask, one will be given to you upon arrival. For doctor visits, patients may have 1 support person aged 77 or older with them. For treatment visits, patients cannot have anyone with them due to current Covid guidelines and our immunocompromised population  Paclitaxel injection What is this medication? PACLITAXEL (PAK li TAX el) is a chemotherapy drug. It targets fast dividing cells, like cancer cells, and causes these cells to die. This medicine is used to treat ovarian cancer, breast cancer, lung cancer, Kaposi's sarcoma, and other cancers. This medicine may be used for other purposes; ask  your health care provider or pharmacist if you have questions. COMMON BRAND NAME(S): Onxol, Taxol What should I tell my care team before I take this medication? They need to know if you have any of these conditions: history of irregular heartbeat liver disease low blood  counts, like low white cell, platelet, or red cell counts lung or breathing disease, like asthma tingling of the fingers or toes, or other nerve disorder an unusual or allergic reaction to paclitaxel, alcohol, polyoxyethylated castor oil, other chemotherapy, other medicines, foods, dyes, or preservatives pregnant or trying to get pregnant breast-feeding How should I use this medication? This drug is given as an infusion into a vein. It is administered in a hospital or clinic by a specially trained health care professional. Talk to your pediatrician regarding the use of this medicine in children. Special care may be needed. Overdosage: If you think you have taken too much of this medicine contact a poison control center or emergency room at once. NOTE: This medicine is only for you. Do not share this medicine with others. What if I miss a dose? It is important not to miss your dose. Call your doctor or health care professional if you are unable to keep an appointment. What may interact with this medication? Do not take this medicine with any of the following medications: live virus vaccines This medicine may also interact with the following medications: antiviral medicines for hepatitis, HIV or AIDS certain antibiotics like erythromycin and clarithromycin certain medicines for fungal infections like ketoconazole and itraconazole certain medicines for seizures like carbamazepine, phenobarbital, phenytoin gemfibrozil nefazodone rifampin St. John's wort This list may not describe all possible interactions. Give your health care provider a list of all the medicines, herbs, non-prescription drugs, or dietary supplements you use. Also tell them if you smoke, drink alcohol, or use illegal drugs. Some items may interact with your medicine. What should I watch for while using this medication? Your condition will be monitored carefully while you are receiving this medicine. You will need important  blood work done while you are taking this medicine. This medicine can cause serious allergic reactions. To reduce your risk you will need to take other medicine(s) before treatment with this medicine. If you experience allergic reactions like skin rash, itching or hives, swelling of the face, lips, or tongue, tell your doctor or health care professional right away. In some cases, you may be given additional medicines to help with side effects. Follow all directions for their use. This drug may make you feel generally unwell. This is not uncommon, as chemotherapy can affect healthy cells as well as cancer cells. Report any side effects. Continue your course of treatment even though you feel ill unless your doctor tells you to stop. Call your doctor or health care professional for advice if you get a fever, chills or sore throat, or other symptoms of a cold or flu. Do not treat yourself. This drug decreases your body's ability to fight infections. Try to avoid being around people who are sick. This medicine may increase your risk to bruise or bleed. Call your doctor or health care professional if you notice any unusual bleeding. Be careful brushing and flossing your teeth or using a toothpick because you may get an infection or bleed more easily. If you have any dental work done, tell your dentist you are receiving this medicine. Avoid taking products that contain aspirin, acetaminophen, ibuprofen, naproxen, or ketoprofen unless instructed by your doctor. These medicines may hide a  fever. Do not become pregnant while taking this medicine. Women should inform their doctor if they wish to become pregnant or think they might be pregnant. There is a potential for serious side effects to an unborn child. Talk to your health care professional or pharmacist for more information. Do not breast-feed an infant while taking this medicine. Men are advised not to father a child while receiving this medicine. This product  may contain alcohol. Ask your pharmacist or healthcare provider if this medicine contains alcohol. Be sure to tell all healthcare providers you are taking this medicine. Certain medicines, like metronidazole and disulfiram, can cause an unpleasant reaction when taken with alcohol. The reaction includes flushing, headache, nausea, vomiting, sweating, and increased thirst. The reaction can last from 30 minutes to several hours. What side effects may I notice from receiving this medication? Side effects that you should report to your doctor or health care professional as soon as possible: allergic reactions like skin rash, itching or hives, swelling of the face, lips, or tongue breathing problems changes in vision fast, irregular heartbeat high or low blood pressure mouth sores pain, tingling, numbness in the hands or feet signs of decreased platelets or bleeding - bruising, pinpoint red spots on the skin, black, tarry stools, blood in the urine signs of decreased red blood cells - unusually weak or tired, feeling faint or lightheaded, falls signs of infection - fever or chills, cough, sore throat, pain or difficulty passing urine signs and symptoms of liver injury like dark yellow or brown urine; general ill feeling or flu-like symptoms; light-colored stools; loss of appetite; nausea; right upper belly pain; unusually weak or tired; yellowing of the eyes or skin swelling of the ankles, feet, hands unusually slow heartbeat Side effects that usually do not require medical attention (report to your doctor or health care professional if they continue or are bothersome): diarrhea hair loss loss of appetite muscle or joint pain nausea, vomiting pain, redness, or irritation at site where injected tiredness This list may not describe all possible side effects. Call your doctor for medical advice about side effects. You may report side effects to FDA at 1-800-FDA-1088. Where should I keep my  medication? This drug is given in a hospital or clinic and will not be stored at home. NOTE: This sheet is a summary. It may not cover all possible information. If you have questions about this medicine, talk to your doctor, pharmacist, or health care provider.  2022 Elsevier/Gold Standard (2021-08-16 00:00:00)

## 2021-10-21 ENCOUNTER — Other Ambulatory Visit: Payer: Self-pay

## 2021-10-21 ENCOUNTER — Inpatient Hospital Stay: Payer: Medicare Other

## 2021-10-21 ENCOUNTER — Other Ambulatory Visit: Payer: Self-pay | Admitting: *Deleted

## 2021-10-21 ENCOUNTER — Inpatient Hospital Stay (HOSPITAL_BASED_OUTPATIENT_CLINIC_OR_DEPARTMENT_OTHER): Payer: Medicare Other | Admitting: Oncology

## 2021-10-21 ENCOUNTER — Encounter: Payer: Self-pay | Admitting: Oncology

## 2021-10-21 VITALS — BP 110/69 | HR 72 | Temp 98.3°F | Resp 16 | Ht 62.5 in | Wt 153.1 lb

## 2021-10-21 DIAGNOSIS — Z79899 Other long term (current) drug therapy: Secondary | ICD-10-CM

## 2021-10-21 DIAGNOSIS — Z7901 Long term (current) use of anticoagulants: Secondary | ICD-10-CM | POA: Diagnosis not present

## 2021-10-21 DIAGNOSIS — C50312 Malignant neoplasm of lower-inner quadrant of left female breast: Secondary | ICD-10-CM

## 2021-10-21 DIAGNOSIS — Z5112 Encounter for antineoplastic immunotherapy: Secondary | ICD-10-CM | POA: Diagnosis not present

## 2021-10-21 DIAGNOSIS — I82403 Acute embolism and thrombosis of unspecified deep veins of lower extremity, bilateral: Secondary | ICD-10-CM | POA: Diagnosis not present

## 2021-10-21 DIAGNOSIS — C50919 Malignant neoplasm of unspecified site of unspecified female breast: Secondary | ICD-10-CM

## 2021-10-21 DIAGNOSIS — Z5181 Encounter for therapeutic drug level monitoring: Secondary | ICD-10-CM | POA: Diagnosis not present

## 2021-10-21 DIAGNOSIS — Z5111 Encounter for antineoplastic chemotherapy: Secondary | ICD-10-CM

## 2021-10-21 LAB — CBC WITH DIFFERENTIAL/PLATELET
Abs Immature Granulocytes: 0.02 10*3/uL (ref 0.00–0.07)
Basophils Absolute: 0 10*3/uL (ref 0.0–0.1)
Basophils Relative: 1 %
Eosinophils Absolute: 0.1 10*3/uL (ref 0.0–0.5)
Eosinophils Relative: 2 %
HCT: 32.9 % — ABNORMAL LOW (ref 36.0–46.0)
Hemoglobin: 10.3 g/dL — ABNORMAL LOW (ref 12.0–15.0)
Immature Granulocytes: 1 %
Lymphocytes Relative: 21 %
Lymphs Abs: 0.8 10*3/uL (ref 0.7–4.0)
MCH: 28.9 pg (ref 26.0–34.0)
MCHC: 31.3 g/dL (ref 30.0–36.0)
MCV: 92.4 fL (ref 80.0–100.0)
Monocytes Absolute: 0.4 10*3/uL (ref 0.1–1.0)
Monocytes Relative: 9 %
Neutro Abs: 2.7 10*3/uL (ref 1.7–7.7)
Neutrophils Relative %: 66 %
Platelets: 229 10*3/uL (ref 150–400)
RBC: 3.56 MIL/uL — ABNORMAL LOW (ref 3.87–5.11)
RDW: 16.2 % — ABNORMAL HIGH (ref 11.5–15.5)
WBC: 4 10*3/uL (ref 4.0–10.5)
nRBC: 0 % (ref 0.0–0.2)

## 2021-10-21 LAB — COMPREHENSIVE METABOLIC PANEL
ALT: 13 U/L (ref 0–44)
AST: 21 U/L (ref 15–41)
Albumin: 3.9 g/dL (ref 3.5–5.0)
Alkaline Phosphatase: 62 U/L (ref 38–126)
Anion gap: 7 (ref 5–15)
BUN: 34 mg/dL — ABNORMAL HIGH (ref 8–23)
CO2: 26 mmol/L (ref 22–32)
Calcium: 8.5 mg/dL — ABNORMAL LOW (ref 8.9–10.3)
Chloride: 104 mmol/L (ref 98–111)
Creatinine, Ser: 1.49 mg/dL — ABNORMAL HIGH (ref 0.44–1.00)
GFR, Estimated: 36 mL/min — ABNORMAL LOW (ref >60)
Glucose, Bld: 123 mg/dL — ABNORMAL HIGH (ref 70–99)
Potassium: 4.3 mmol/L (ref 3.5–5.1)
Sodium: 137 mmol/L (ref 135–145)
Total Bilirubin: 0.5 mg/dL (ref 0.3–1.2)
Total Protein: 6.8 g/dL (ref 6.5–8.1)

## 2021-10-21 MED ORDER — HEPARIN SOD (PORK) LOCK FLUSH 100 UNIT/ML IV SOLN
INTRAVENOUS | Status: AC
  Administered 2021-10-21: 500 [IU]
  Filled 2021-10-21: qty 5

## 2021-10-21 MED ORDER — SODIUM CHLORIDE 0.9 % IV SOLN
Freq: Once | INTRAVENOUS | Status: AC
  Filled 2021-10-21: qty 250

## 2021-10-21 MED ORDER — ACETAMINOPHEN 325 MG PO TABS
650.0000 mg | ORAL_TABLET | Freq: Once | ORAL | Status: AC
  Administered 2021-10-21: 650 mg via ORAL
  Filled 2021-10-21: qty 2

## 2021-10-21 MED ORDER — SODIUM CHLORIDE 0.9 % IV SOLN
65.0000 mg/m2 | Freq: Once | INTRAVENOUS | Status: AC
  Administered 2021-10-21: 114 mg via INTRAVENOUS
  Filled 2021-10-21: qty 19

## 2021-10-21 MED ORDER — SODIUM CHLORIDE 0.9 % IV SOLN
420.0000 mg | Freq: Once | INTRAVENOUS | Status: AC
  Administered 2021-10-21: 420 mg via INTRAVENOUS
  Filled 2021-10-21: qty 14

## 2021-10-21 MED ORDER — ZOLEDRONIC ACID 4 MG/5ML IV CONC
3.3000 mg | INTRAVENOUS | Status: DC
  Administered 2021-10-21: 3.3 mg via INTRAVENOUS
  Filled 2021-10-21: qty 4.13

## 2021-10-21 MED ORDER — SODIUM CHLORIDE 0.9 % IV SOLN
10.0000 mg | Freq: Once | INTRAVENOUS | Status: AC
  Administered 2021-10-21: 10 mg via INTRAVENOUS
  Filled 2021-10-21: qty 10

## 2021-10-21 MED ORDER — DIPHENHYDRAMINE HCL 50 MG/ML IJ SOLN
50.0000 mg | Freq: Once | INTRAMUSCULAR | Status: AC
  Administered 2021-10-21: 50 mg via INTRAVENOUS
  Filled 2021-10-21: qty 1

## 2021-10-21 MED ORDER — SODIUM CHLORIDE 0.9% FLUSH
10.0000 mL | INTRAVENOUS | Status: DC | PRN
  Filled 2021-10-21: qty 10

## 2021-10-21 MED ORDER — LORAZEPAM 0.5 MG PO TABS: 0.5000 mg | ORAL_TABLET | Freq: Every day | ORAL | 0 refills | Status: DC | PRN

## 2021-10-21 MED ORDER — HEPARIN SOD (PORK) LOCK FLUSH 100 UNIT/ML IV SOLN
500.0000 [IU] | Freq: Once | INTRAVENOUS | Status: AC | PRN
  Filled 2021-10-21: qty 5

## 2021-10-21 MED ORDER — FAMOTIDINE 20 MG IN NS 100 ML IVPB
20.0000 mg | Freq: Once | INTRAVENOUS | Status: AC
  Administered 2021-10-21: 20 mg via INTRAVENOUS
  Filled 2021-10-21: qty 20

## 2021-10-21 MED ORDER — TRASTUZUMAB-DKST CHEMO 150 MG IV SOLR
6.0000 mg/kg | Freq: Once | INTRAVENOUS | Status: AC
  Administered 2021-10-21: 420 mg via INTRAVENOUS
  Filled 2021-10-21: qty 20

## 2021-10-21 NOTE — Patient Instructions (Signed)
[facility]  Discharge Instructions: Thank you for choosing [facility] to provide your oncology and hematology care.  If you have a lab appointment with the [facility], please go directly to the [facility] and check in at the registration area.  Wear comfortable clothing and clothing appropriate for easy access to any Portacath or PICC line.   We strive to give you quality time with your provider. You may need to reschedule your appointment if you arrive late (15 or more minutes).  Arriving late affects you and other patients whose appointments are after yours.  Also, if you miss three or more appointments without notifying the office, you may be dismissed from the clinic at the provider's discretion.      For prescription refill requests, have your pharmacy contact our office and allow 72 hours for refills to be completed.    Today you received the following chemotherapy and/or immunotherapy agents - trastuzumab, pertuzumab, paclitaxel      To help prevent nausea and vomiting after your treatment, we encourage you to take your nausea medication as directed.  BELOW ARE SYMPTOMS THAT SHOULD BE REPORTED IMMEDIATELY: *FEVER GREATER THAN 100.4 F (38 C) OR HIGHER *CHILLS OR SWEATING *NAUSEA AND VOMITING THAT IS NOT CONTROLLED WITH YOUR NAUSEA MEDICATION *UNUSUAL SHORTNESS OF BREATH *UNUSUAL BRUISING OR BLEEDING *URINARY PROBLEMS (pain or burning when urinating, or frequent urination) *BOWEL PROBLEMS (unusual diarrhea, constipation, pain near the anus) TENDERNESS IN MOUTH AND THROAT WITH OR WITHOUT PRESENCE OF ULCERS (sore throat, sores in mouth, or a toothache) UNUSUAL RASH, SWELLING OR PAIN  UNUSUAL VAGINAL DISCHARGE OR ITCHING   Items with * indicate a potential emergency and should be followed up as soon as possible or go to the Emergency Department if any problems should occur.  Please show the CHEMOTHERAPY ALERT CARD or  IMMUNOTHERAPY ALERT CARD at check-in to the Emergency Department and triage nurse.  Should you have questions after your visit or need to cancel or reschedule your appointment, please contact [facility]  [phone] and follow the prompts.  Office hours are 8:00 a.m. to 4:30 p.m. Monday - Friday. Please note that voicemails left after 4:00 p.m. may not be returned until the following business day.  We are closed weekends and major holidays. You have access to a nurse at all times for urgent questions. Please call the main number to the clinic [phone] and follow the prompts.  For any non-urgent questions, you may also contact your provider using MyChart. We now offer e-Visits for anyone 75 and older to request care online for non-urgent symptoms. For details visit [email or web address].   Also download the MyChart app! Go to the app store, search "MyChart", open the app, select [facility], and log in with your MyChart username and password.  Due to Covid, a mask is required upon entering the hospital/clinic. If you do not have a mask, one will be given to you upon arrival. For doctor visits, patients may have 1 support person aged 27 or older with them. For treatment visits, patients cannot have anyone with them due to current Covid guidelines and our immunocompromised population.

## 2021-10-21 NOTE — Progress Notes (Signed)
Pt states she is having skin issues feels crusty on the top of her head, and will like to talk about anti-anxiety medication. Along, with her vision getting worse after tx.

## 2021-10-21 NOTE — Progress Notes (Signed)
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  She had a screening mammogram in September 2014 which showed 2.1 x 2.3 x 1.8 cm irregular mass in her right breast.  It was ER 10% positive PR negative and HER2 positive +3.  She received neoadjuvant chemotherapy with Taxol Herceptin and Perjeta for 4 cycles followed by dose dense AC/Herceptin x4 which she completed in March 2015.  She had a right lumpectomy on 04/03/2014 which showed scant residual invasive ductal carcinoma [id number].  She completed 1 year of adjuvant Herceptin chemotherapy and also completed adjuvant radiation treatment.  She was recommended anastrozole which she took on and off starting November 2015 and stopped sometime in 2020.   She was then hospitalized with neck pain and was found to have lytic lesions involving C5-C6 with pathological vertebral fractures.  She underwent radiation treatment to this area.  Image guided biopsy of the L1 vertebral body showed metastatic carcinoma consistent with breast origin ER 10% PR 0% and HER2 amplified ratio 5.1 average HER2 signal number per cell 15.0 average CEP 17 signals number per cell 3.0.  Baseline echocardiogram on 05/02/2021 showed a normal EF of 62% she was recommended Taxol Herceptin and Perjeta which she received for 2 cycles at [facility] until June 10, 2021   She has chronic pain from her bone metastases for which she is currently on oxycodone 10 mg every 4 hours as needed and 12 mcg fentanyl patch.  She was seeing pain clinic when she was living in [name].   Patient has also been on Zometa when she was in [name] but she does have some ongoing dental issues.  She has received Xgeva in the past as well.  Her last Delton See was in July 2021.  Last PET scan was on 03/21/2021 which showed diffuse osseous metastatic disease involving the head neck chest abdomen and pelvis and spine.  Left lung apex hypermetabolic nodule and multiple hypermetabolic liver lesions concerning for disease involvement.  Enlarged hypermetabolic left inguinal lymph nodes along with hypermetabolic external iliac and left supraclavicular lymph nodes   Patient is now moved to [place] to be close to her daughter.  She lives in an independent living. Currently patient is getting Taxol Herceptin and Perjeta every 3 weeks with weekly Taxol  Interval history-she is tolerating treatment well.  Patient continues to have endorsed flaky sensation in her scalp as if her skin is crawling down onto her eyes and face.  States that she is always had problems holding onto objects in her hand especially after her radiation treatment to the C5-C6 area.  ECOG PS- 2 Pain scale- 0   Review of systems- Review of Systems  Constitutional:  Positive for malaise/fatigue. Negative for chills, fever and weight loss.  HENT:  Negative for congestion, ear discharge and nosebleeds.   Eyes:  Negative for blurred vision.  Respiratory:  Negative for cough, hemoptysis, sputum  production, shortness of breath and wheezing.   Cardiovascular:  Negative for chest pain, palpitations, orthopnea and claudication.  Gastrointestinal:  Negative for abdominal pain, blood in stool, constipation, diarrhea, heartburn, melena, nausea and vomiting.  Genitourinary:  Negative for dysuria, flank pain, frequency, hematuria and  urgency.  Musculoskeletal:  Negative for back pain, joint pain and myalgias.  Skin:  Negative for rash.  Neurological:  Negative for dizziness, tingling, focal weakness, seizures, weakness and headaches.  Endo/Heme/Allergies:  Does not bruise/bleed easily.  Psychiatric/Behavioral:  Negative for depression and suicidal ideas. The patient does not have insomnia.      Allergies  Allergen Reactions   Corticosteroids Other (See Comments)    Pt trf from [name] and per primary md for her cancer tx. Notes that it causes agitation intolerance   Sulfa Antibiotics Other (See Comments)    Pt moved from [name] and in MD notes she has allergy but we do not know reactions when taking the drug   Celebrex [Celecoxib] Rash     Past Medical History:  Diagnosis Date   ADHD (attention deficit hyperactivity disorder)    in [place], no date on md note   Anemia    IDA 11/26/2019, Anemia in stage 4 chronic kidney disease ([place]) 09/03/2020   Arthritis    osteoarthritis right knee 09/30/2014   Breast cancer ([place]) 10/05/2013   in [place] +, [place] -, Her 2 is 3+   Cancer related pain 03/28/2021   in [name], md notes spine mets   DVT of lower extremity, bilateral ([place]) 03/30/2014   in [name]   Generalized muscle weakness 03/31/2016   in [name]   GERD (gastroesophageal reflux disease) 05/15/2013   per md in [name]   Hyperparathyroidism, secondary ([place]) 04/25/2018   in [name]   Hypertension 02/20/2016   info from MD in [facility]   Lumbar compression fracture ([place]) 03/18/2021   in [place] loss 02/05/2015   in [name]   Metabolic acidosis 81/12/7508   in [place]   Metabolic syndrome 25/85/2778   in [name]   Osteopenia after menopause 03/29/2016   in [place] of both eyes 09/30/2012   per md in [name] where pt. 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   in [name]   Stasis ulcer of left lower extremity ([place]) 03/29/2016   in [name]     Past Surgical History:  Procedure Laterality Date    CESAREAN SECTION     unknown   fibroid removed  N/A    in [place] - [place] date   IR FLUORO GUIDE CV LINE LEFT  07/27/2021   IR PORT REPAIR CENTRAL VENOUS ACCESS DEVICE Left    In [place] N/A 02/25/2014   in [name]   ovary removed      unknown   [place] CATARACT EXTRACAP,INSERT LENS Bilateral  Bilateral 09/05/2012   in [place]     unknown    LUMPECTOMY Right 04/03/2014   in [place]    Social History   Socioeconomic History   Marital status: Divorced    Spouse name: Not on file   Number of children: Not on file   Years of education: Not on file   Highest education level: Not on file  Occupational History   Occupation: retired Teacher, music    Comment: In [place]  Tobacco Use   Smoking status: Never   Smokeless tobacco: Never  Vaping Use  Vaping Use: Never used  Substance and Sexual Activity   Alcohol use: Not Currently   Drug use: Never   Sexual activity: Not Currently  Other Topics Concern   Not on file  Social History Narrative   Not on file   Social Determinants of Health   Financial Resource Strain: Not on file  Food Insecurity: Not on file  Transportation Needs: Not on file  Physical Activity: Not on file  Stress: Not on file  Social Connections: Not on file  Intimate Partner Violence: Not on file    Family History  Problem Relation Age of Onset   Pancreatic cancer Mother    Stroke Father    Diabetes Father    Hypertension Father    Heart disease Father    Skin cancer Father    Varicose Veins Father    Skin cancer Brother    Cancer - Prostate Brother      Current Outpatient Medications:    acetaminophen (TYLENOL) 500 MG tablet, 1 tablet as needed, Disp: , Rfl:    amphetamine-dextroamphetamine (ADDERALL XR) 30 MG 24 hr capsule, Take 1 capsule (30 mg total) by mouth daily., Disp: 30 capsule, Rfl: 0   Calcium 200 MG TABS, Take 1 tablet by mouth daily., Disp: , Rfl:    fentaNYL (DURAGESIC) 12 MCG/HR, Place 1 patch  onto the skin every 3 (three) days. 1 patch to skin, Disp: 10 patch, Rfl: 0   gabapentin (NEURONTIN) 400 MG capsule, 1 capsule, Disp: , Rfl:    gentamicin cream (GARAMYCIN) 0.1 %, Apply topically daily., Disp: , Rfl:    lisinopril (ZESTRIL) 20 MG tablet, 1 tablet, Disp: , Rfl:    Multiple Vitamins-Minerals (MULTIVITAMIN ADULTS) TABS, See admin instructions., Disp: , Rfl:    nystatin (MYCOSTATIN) 100000 UNIT/ML suspension, Take 5 mLs by mouth 4 (four) times daily., Disp: , Rfl:    Oxycodone HCl 10 MG TABS, Take 1 tablet (10 mg total) by mouth every 4 (four) hours as needed., Disp: 84 tablet, Rfl: 0   rivaroxaban (XARELTO) 20 MG TABS tablet, Take 1 tablet (20 mg total) by mouth daily with supper., Disp: 30 tablet, Rfl: 3   SANTYL ointment, Apply topically as directed., Disp: , Rfl:    terbinafine (LAMISIL AT) 1 % cream, Apply 1 application topically 2 (two) times daily., Disp: 30 g, Rfl: 5   zolpidem (AMBIEN) 5 MG tablet, Take 1 tablet (5 mg total) by mouth at bedtime., Disp: 30 tablet, Rfl: 0   cyclobenzaprine (FLEXERIL) 10 MG tablet, Take by mouth. (Patient not taking: No sig reported), Disp: , Rfl:    naloxone (NARCAN) nasal spray 4 mg/0.1 mL, Spray entire contents of inhaler into nostril for suspected opioid overdose.  Call 911.  Repeat in other nostril in 2-3 minutes if needed. (Patient not taking: Reported on 10/21/2021), Disp: , Rfl:    NARCAN 4 MG/0.1ML LIQD nasal spray kit, SMARTSIG:1 Spray(s) Both Nares Once PRN (Patient not taking: No sig reported), Disp: , Rfl:    ondansetron (ZOFRAN-ODT) 8 MG disintegrating tablet, 1 tablet on the tongue and allow to dissolve  as needed (Patient not taking: No sig reported), Disp: , Rfl:    potassium chloride SA (KLOR-CON) 20 MEQ tablet, Take 1 tablet (20 mEq total) by mouth daily. (Patient not taking: No sig reported), Disp: 7 tablet, Rfl: 0 No current facility-administered medications for this visit.  Facility-Administered Medications Ordered in Other  Visits:    heparin lock flush 100 UNIT/ML injection, , , ,  heparin lock flush 100 UNIT/ML injection, , , ,    heparin lock flush 100 unit/mL, 500 Units, Intracatheter, Once PRN, [name], MD   PACLitaxel (TAXOL) 114 mg in sodium chloride 0.9 % 250 mL chemo infusion (</= 40m/m2), 65 mg/m2 (Treatment Plan Recorded), Intravenous, Once, RSindy Guadeloupe MD, Last Rate: 269 mL/hr at 10/21/21 1230, 114 mg at 10/21/21 1230   sodium chloride flush (NS) 0.9 % injection 10 mL, 10 mL, Intracatheter, PRN, RSindy Guadeloupe MD   sodium chloride flush (NS) 0.9 % injection 10 mL, 10 mL, Intracatheter, PRN, RSindy Guadeloupe MD   zoledronic acid (ZOMETA) 3.3 mg in sodium chloride 0.9 % 100 mL IVPB, 3.3 mg, Intravenous, Q28 days, RSindy Guadeloupe MD  Physical exam:  Vitals:   10/21/21 0903  BP: 110/69  Pulse: 72  Resp: 16  Temp: 98.3 F (36.8 C)  SpO2: 100%  Weight: 153 lb 1.6 oz (69.4 kg)  Height: 5' 2.5" (1.588 m)   Physical Exam Constitutional:      General: She is not in acute distress.    Comments: Ambulates with a walker  Eyes:     Pupils: Pupils are equal, round, and reactive to light.  Cardiovascular:     Rate and Rhythm: Normal rate and regular rhythm.     Heart sounds: Normal heart sounds.  Pulmonary:     Effort: Pulmonary effort is normal.     Breath sounds: Normal breath sounds.  Abdominal:     General: Bowel sounds are normal.     Palpations: Abdomen is soft.  Musculoskeletal:     Comments: Dressing in place over bilateral lower extremities  Skin:    General: Skin is warm and dry.  Neurological:     Mental Status: She is alert and oriented to person, place, and time.     CMP Latest Ref Rng & Units 10/21/2021  Glucose 70 - 99 mg/dL 123(H)  BUN 8 - 23 mg/dL 34(H)  Creatinine 0.44 - 1.00 mg/dL 1.49(H)  Sodium 135 - 145 mmol/L 137  Potassium 3.5 - 5.1 mmol/L 4.3  Chloride 98 - 111 mmol/L 104  CO2 22 - 32 mmol/L 26  Calcium 8.9 - 10.3 mg/dL 8.5(L)  Total Protein 6.5 -  8.1 g/dL 6.8  Total Bilirubin 0.3 - 1.2 mg/dL 0.5  Alkaline Phos 38 - 126 U/L 62  AST 15 - 41 U/L 21  ALT 0 - 44 U/L 13   CBC Latest Ref Rng & Units 10/21/2021  WBC 4.0 - 10.5 K/uL 4.0  Hemoglobin 12.0 - 15.0 g/dL 10.3(L)  Hematocrit 36.0 - 46.0 % 32.9(L)  Platelets 150 - 400 K/uL 229      Assessment and plan- Patient is a 75y.o. female with metastatic weakly ER positive HER2 positive breast cancer with liver lymph node and bone metastases.  She is here for on treatment assessment prior to cycle 6-day 1 of Taxol Herceptin and Perjeta  Counts okay to proceed with cycle 6-day 1 of Taxol Herceptin and Perjeta today.  She will proceed with Taxol chemotherapy alone next week.  We will give her a break from chemotherapy during the week of Thanksgiving and I will see her back in 3 weeks for cycle 7-day 1 of Taxol Herceptin and Perjeta.  We will obtain CT chest abdomen and pelvis without contrast and a bone scan prior.  She will also need echocardiogram prior.  Bone metastases: We will get Zometa today.  Chemo induced anemia: Overall stable continue  to monitor   Visit Diagnosis 1. Metastatic breast cancer ([place])   2. Encounter for antineoplastic chemotherapy   3. Acute embolism and thrombosis of unspecified deep veins of lower extremity, bilateral ([place])   4. Encounter for monoclonal antibody treatment for malignancy   5. Malignant neoplasm of lower-inner quadrant of left female breast, unspecified estrogen receptor status ([place])    6. Current use of long term anticoagulation   7. Encounter for monitoring cardiotoxic drug therapy      Dr. Randa Evens, MD, MPH [facility] [phone] 10/21/2021 1:01 PM

## 2021-10-21 NOTE — Progress Notes (Signed)
No blood return from port. Flushes easily without pain or swelling. OK to use for treatment per Dr. Janese Banks.

## 2021-10-24 ENCOUNTER — Telehealth: Payer: Self-pay | Admitting: *Deleted

## 2021-10-24 NOTE — Telephone Encounter (Signed)
Will you like for [place] to contact pt and see what is going on because the daughter sent this message and did not want pt to know she contacted [place]. Or, will you like to wait until her next appt and see if she accepts referral?

## 2021-10-24 NOTE — Telephone Encounter (Signed)
I dont see anything wrong with ehr scalp or skin and she has been complaining of these symptoms for many months now. Outpatient psych referral can be made but we need to first find out if she is willing to go one and not against her will. Also it is unlikely to happen next week. I see her on 12/2 and I can see if patient would accept pysch referral

## 2021-10-24 NOTE — Telephone Encounter (Signed)
Laura Santiago, patient daughter called reporting that patient is not doing well today stating that she is having a psychotic episode. Patient reports she is having problems with her skin. Daughter reports that she has called mobile crisis and they are sending someone out to see patient. Patient feels her skin and scalp is crawling, she is paranoid and being neglects she is also lashing out at her also. Daughter is asking if patient can be referred to psych since the mobile crisis is not a permanent solution. She does not want her mother to know that she is talking about her as this will make things worse and she does not want [place] to take away any of her medications for the same reason.  She is asking if patient can be examined ASAP. She states that she will get patient to psych appointment if we get one and is asking for it to be this week if at all possible. Please advise

## 2021-10-25 ENCOUNTER — Other Ambulatory Visit: Payer: Self-pay | Admitting: Oncology

## 2021-10-26 ENCOUNTER — Encounter: Payer: Self-pay | Admitting: *Deleted

## 2021-10-28 ENCOUNTER — Other Ambulatory Visit: Payer: Self-pay

## 2021-10-28 ENCOUNTER — Inpatient Hospital Stay: Payer: Medicare Other

## 2021-10-28 VITALS — BP 111/63 | HR 75 | Temp 98.3°F | Resp 20 | Wt 152.2 lb

## 2021-10-28 DIAGNOSIS — C50919 Malignant neoplasm of unspecified site of unspecified female breast: Secondary | ICD-10-CM

## 2021-10-28 DIAGNOSIS — Z5112 Encounter for antineoplastic immunotherapy: Secondary | ICD-10-CM | POA: Diagnosis not present

## 2021-10-28 LAB — COMPREHENSIVE METABOLIC PANEL
ALT: 13 U/L (ref 0–44)
AST: 22 U/L (ref 15–41)
Albumin: 3.7 g/dL (ref 3.5–5.0)
Alkaline Phosphatase: 64 U/L (ref 38–126)
Anion gap: 10 (ref 5–15)
BUN: 28 mg/dL — ABNORMAL HIGH (ref 8–23)
CO2: 22 mmol/L (ref 22–32)
Calcium: 8.8 mg/dL — ABNORMAL LOW (ref 8.9–10.3)
Chloride: 105 mmol/L (ref 98–111)
Creatinine, Ser: 1.18 mg/dL — ABNORMAL HIGH (ref 0.44–1.00)
GFR, Estimated: 48 mL/min — ABNORMAL LOW (ref >60)
Glucose, Bld: 115 mg/dL — ABNORMAL HIGH (ref 70–99)
Potassium: 4.4 mmol/L (ref 3.5–5.1)
Sodium: 137 mmol/L (ref 135–145)
Total Bilirubin: 0.1 mg/dL — ABNORMAL LOW (ref 0.3–1.2)
Total Protein: 6.6 g/dL (ref 6.5–8.1)

## 2021-10-28 LAB — CBC WITH DIFFERENTIAL/PLATELET
Abs Immature Granulocytes: 0.03 10*3/uL (ref 0.00–0.07)
Basophils Absolute: 0 10*3/uL (ref 0.0–0.1)
Basophils Relative: 1 %
Eosinophils Absolute: 0.1 10*3/uL (ref 0.0–0.5)
Eosinophils Relative: 3 %
HCT: 30.8 % — ABNORMAL LOW (ref 36.0–46.0)
Hemoglobin: 9.6 g/dL — ABNORMAL LOW (ref 12.0–15.0)
Immature Granulocytes: 1 %
Lymphocytes Relative: 23 %
Lymphs Abs: 0.9 10*3/uL (ref 0.7–4.0)
MCH: 28.7 pg (ref 26.0–34.0)
MCHC: 31.2 g/dL (ref 30.0–36.0)
MCV: 91.9 fL (ref 80.0–100.0)
Monocytes Absolute: 0.3 10*3/uL (ref 0.1–1.0)
Monocytes Relative: 8 %
Neutro Abs: 2.7 10*3/uL (ref 1.7–7.7)
Neutrophils Relative %: 64 %
Platelets: 234 10*3/uL (ref 150–400)
RBC: 3.35 MIL/uL — ABNORMAL LOW (ref 3.87–5.11)
RDW: 16.3 % — ABNORMAL HIGH (ref 11.5–15.5)
WBC: 4.1 10*3/uL (ref 4.0–10.5)
nRBC: 0 % (ref 0.0–0.2)

## 2021-10-28 MED ORDER — DIPHENHYDRAMINE HCL 50 MG/ML IJ SOLN
50.0000 mg | Freq: Once | INTRAMUSCULAR | Status: AC
  Administered 2021-10-28: 50 mg via INTRAVENOUS
  Filled 2021-10-28: qty 1

## 2021-10-28 MED ORDER — HEPARIN SOD (PORK) LOCK FLUSH 100 UNIT/ML IV SOLN
INTRAVENOUS | Status: AC
  Filled 2021-10-28: qty 5

## 2021-10-28 MED ORDER — SODIUM CHLORIDE 0.9 % IV SOLN
10.0000 mg | Freq: Once | INTRAVENOUS | Status: AC
  Administered 2021-10-28: 10 mg via INTRAVENOUS
  Filled 2021-10-28: qty 10

## 2021-10-28 MED ORDER — HEPARIN SOD (PORK) LOCK FLUSH 100 UNIT/ML IV SOLN
500.0000 [IU] | Freq: Once | INTRAVENOUS | Status: AC | PRN
  Administered 2021-10-28: 500 [IU]
  Filled 2021-10-28: qty 5

## 2021-10-28 MED ORDER — SODIUM CHLORIDE 0.9 % IV SOLN
Freq: Once | INTRAVENOUS | Status: AC
  Filled 2021-10-28: qty 250

## 2021-10-28 MED ORDER — SODIUM CHLORIDE 0.9% FLUSH
10.0000 mL | INTRAVENOUS | Status: DC | PRN
  Administered 2021-10-28: 10 mL
  Filled 2021-10-28: qty 10

## 2021-10-28 MED ORDER — FAMOTIDINE 20 MG IN NS 100 ML IVPB
20.0000 mg | Freq: Once | INTRAVENOUS | Status: AC
  Administered 2021-10-28: 20 mg via INTRAVENOUS
  Filled 2021-10-28: qty 20

## 2021-10-28 MED ORDER — SODIUM CHLORIDE 0.9 % IV SOLN
65.0000 mg/m2 | Freq: Once | INTRAVENOUS | Status: AC
  Administered 2021-10-28: 114 mg via INTRAVENOUS
  Filled 2021-10-28: qty 19

## 2021-10-28 NOTE — Patient Instructions (Signed)
[name]  Discharge Instructions: Thank you for choosing [facility] to provide your oncology and hematology care.  If you have a lab appointment with the [facility], please go directly to the [facility] and check in at the registration area.  Wear comfortable clothing and clothing appropriate for easy access to any Portacath or PICC line.   We strive to give you quality time with your provider. You may need to reschedule your appointment if you arrive late (15 or more minutes).  Arriving late affects you and other patients whose appointments are after yours.  Also, if you miss three or more appointments without notifying the office, you may be dismissed from the clinic at the provider's discretion.      For prescription refill requests, have your pharmacy contact our office and allow 72 hours for refills to be completed.    Today you received the following chemotherapy and/or immunotherapy agents: Taxol      To help prevent nausea and vomiting after your treatment, we encourage you to take your nausea medication as directed.  BELOW ARE SYMPTOMS THAT SHOULD BE REPORTED IMMEDIATELY: *FEVER GREATER THAN 100.4 F (38 C) OR HIGHER *CHILLS OR SWEATING *NAUSEA AND VOMITING THAT IS NOT CONTROLLED WITH YOUR NAUSEA MEDICATION *UNUSUAL SHORTNESS OF BREATH *UNUSUAL BRUISING OR BLEEDING *URINARY PROBLEMS (pain or burning when urinating, or frequent urination) *BOWEL PROBLEMS (unusual diarrhea, constipation, pain near the anus) TENDERNESS IN MOUTH AND THROAT WITH OR WITHOUT PRESENCE OF ULCERS (sore throat, sores in mouth, or a toothache) UNUSUAL RASH, SWELLING OR PAIN  UNUSUAL VAGINAL DISCHARGE OR ITCHING   Items with * indicate a potential emergency and should be followed up as soon as possible or go to the Emergency Department if any problems should occur.  Please show the CHEMOTHERAPY ALERT CARD or IMMUNOTHERAPY ALERT CARD at check-in to  the Emergency Department and triage nurse.  Should you have questions after your visit or need to cancel or reschedule your appointment, please contact [facility]  [phone] and follow the prompts.  Office hours are 8:00 a.m. to 4:30 p.m. Monday - Friday. Please note that voicemails left after 4:00 p.m. may not be returned until the following business day.  We are closed weekends and major holidays. You have access to a nurse at all times for urgent questions. Please call the main number to the clinic [phone] and follow the prompts.  For any non-urgent questions, you may also contact your provider using MyChart. We now offer e-Visits for anyone 76 and older to request care online for non-urgent symptoms. For details visit [email or web address].   Also download the MyChart app! Go to the app store, search "MyChart", open the app, select [facility], and log in with your MyChart username and password.  Due to Covid, a mask is required upon entering the hospital/clinic. If you do not have a mask, one will be given to you upon arrival. For doctor visits, patients may have 1 support person aged 62 or older with them. For treatment visits, patients cannot have anyone with them due to current Covid guidelines and our immunocompromised population. Paclitaxel injection What is this medication? PACLITAXEL (PAK li TAX el) is a chemotherapy drug. It targets fast dividing cells, like cancer cells, and causes these cells to die. This medicine is used to treat ovarian cancer, breast cancer, lung cancer, Kaposi's sarcoma, and other cancers. This medicine may be used for other purposes; ask your health  care provider or pharmacist if you have questions. COMMON BRAND NAME(S): Onxol, Taxol What should I tell my care team before I take this medication? They need to know if you have any of these conditions: history of irregular heartbeat liver disease low blood counts, like  low white cell, platelet, or red cell counts lung or breathing disease, like asthma tingling of the fingers or toes, or other nerve disorder an unusual or allergic reaction to paclitaxel, alcohol, polyoxyethylated castor oil, other chemotherapy, other medicines, foods, dyes, or preservatives pregnant or trying to get pregnant breast-feeding How should I use this medication? This drug is given as an infusion into a vein. It is administered in a hospital or clinic by a specially trained health care professional. Talk to your pediatrician regarding the use of this medicine in children. Special care may be needed. Overdosage: If you think you have taken too much of this medicine contact a poison control center or emergency room at once. NOTE: This medicine is only for you. Do not share this medicine with others. What if I miss a dose? It is important not to miss your dose. Call your doctor or health care professional if you are unable to keep an appointment. What may interact with this medication? Do not take this medicine with any of the following medications: live virus vaccines This medicine may also interact with the following medications: antiviral medicines for hepatitis, HIV or AIDS certain antibiotics like erythromycin and clarithromycin certain medicines for fungal infections like ketoconazole and itraconazole certain medicines for seizures like carbamazepine, phenobarbital, phenytoin gemfibrozil nefazodone rifampin St. John's wort This list may not describe all possible interactions. Give your health care provider a list of all the medicines, herbs, non-prescription drugs, or dietary supplements you use. Also tell them if you smoke, drink alcohol, or use illegal drugs. Some items may interact with your medicine. What should I watch for while using this medication? Your condition will be monitored carefully while you are receiving this medicine. You will need important blood work done  while you are taking this medicine. This medicine can cause serious allergic reactions. To reduce your risk you will need to take other medicine(s) before treatment with this medicine. If you experience allergic reactions like skin rash, itching or hives, swelling of the face, lips, or tongue, tell your doctor or health care professional right away. In some cases, you may be given additional medicines to help with side effects. Follow all directions for their use. This drug may make you feel generally unwell. This is not uncommon, as chemotherapy can affect healthy cells as well as cancer cells. Report any side effects. Continue your course of treatment even though you feel ill unless your doctor tells you to stop. Call your doctor or health care professional for advice if you get a fever, chills or sore throat, or other symptoms of a cold or flu. Do not treat yourself. This drug decreases your body's ability to fight infections. Try to avoid being around people who are sick. This medicine may increase your risk to bruise or bleed. Call your doctor or health care professional if you notice any unusual bleeding. Be careful brushing and flossing your teeth or using a toothpick because you may get an infection or bleed more easily. If you have any dental work done, tell your dentist you are receiving this medicine. Avoid taking products that contain aspirin, acetaminophen, ibuprofen, naproxen, or ketoprofen unless instructed by your doctor. These medicines may hide a fever. Do  not become pregnant while taking this medicine. Women should inform their doctor if they wish to become pregnant or think they might be pregnant. There is a potential for serious side effects to an unborn child. Talk to your health care professional or pharmacist for more information. Do not breast-feed an infant while taking this medicine. Men are advised not to father a child while receiving this medicine. This product may contain  alcohol. Ask your pharmacist or healthcare provider if this medicine contains alcohol. Be sure to tell all healthcare providers you are taking this medicine. Certain medicines, like metronidazole and disulfiram, can cause an unpleasant reaction when taken with alcohol. The reaction includes flushing, headache, nausea, vomiting, sweating, and increased thirst. The reaction can last from 30 minutes to several hours. What side effects may I notice from receiving this medication? Side effects that you should report to your doctor or health care professional as soon as possible: allergic reactions like skin rash, itching or hives, swelling of the face, lips, or tongue breathing problems changes in vision fast, irregular heartbeat high or low blood pressure mouth sores pain, tingling, numbness in the hands or feet signs of decreased platelets or bleeding - bruising, pinpoint red spots on the skin, black, tarry stools, blood in the urine signs of decreased red blood cells - unusually weak or tired, feeling faint or lightheaded, falls signs of infection - fever or chills, cough, sore throat, pain or difficulty passing urine signs and symptoms of liver injury like dark yellow or brown urine; general ill feeling or flu-like symptoms; light-colored stools; loss of appetite; nausea; right upper belly pain; unusually weak or tired; yellowing of the eyes or skin swelling of the ankles, feet, hands unusually slow heartbeat Side effects that usually do not require medical attention (report to your doctor or health care professional if they continue or are bothersome): diarrhea hair loss loss of appetite muscle or joint pain nausea, vomiting pain, redness, or irritation at site where injected tiredness This list may not describe all possible side effects. Call your doctor for medical advice about side effects. You may report side effects to FDA at 1-800-FDA-1088. Where should I keep my medication? This drug  is given in a hospital or clinic and will not be stored at home. NOTE: This sheet is a summary. It may not cover all possible information. If you have questions about this medicine, talk to your doctor, pharmacist, or health care provider.  2022 Elsevier/Gold Standard (2021-08-16 00:00:00)

## 2021-11-01 ENCOUNTER — Encounter: Payer: Self-pay | Admitting: Oncology

## 2021-11-01 ENCOUNTER — Other Ambulatory Visit: Payer: Self-pay

## 2021-11-01 ENCOUNTER — Ambulatory Visit (INDEPENDENT_AMBULATORY_CARE_PROVIDER_SITE_OTHER): Payer: Medicare Other | Admitting: Nurse Practitioner

## 2021-11-01 ENCOUNTER — Encounter (INDEPENDENT_AMBULATORY_CARE_PROVIDER_SITE_OTHER): Payer: Self-pay | Admitting: Nurse Practitioner

## 2021-11-01 VITALS — BP 123/72 | HR 93 | Resp 16 | Ht 63.0 in | Wt 153.8 lb

## 2021-11-01 DIAGNOSIS — L97929 Non-pressure chronic ulcer of unspecified part of left lower leg with unspecified severity: Secondary | ICD-10-CM

## 2021-11-01 NOTE — Progress Notes (Signed)
Subjective:    Patient ID: Laura Santiago, female    DOB: Mar 13, 1946, 75 y.o.   MRN: [id number] Chief Complaint  Patient presents with   New Patient (Initial Visit)    Ref venous  stasis ulcers    Laura Santiago is a 75 year old female that presents today as a referral from her infectious disease specialist in regards to a persistent ulceration.  Originally this began as a small venous ulceration however this ulceration has been present for approximately 6 years on and off.  Per the patient it will heal and then after several months it erupts again.  The patient previously had ABIs done on 07/11/2021 which showed an ABI of 1.16 on the right and 1.37 on the left.  The patient had a TBI 0.96 on the right and 0.89 on the left.  The patient had triphasic tibial artery waveforms with normal toe waveforms bilaterally.  Based on this the patient should have adequate arterial perfusion for wound healing.  Per the patient this is also consistent with previous studies that she had [place].  The patient has a concern that it may be related to a persistent fungal infection causing her eruption on the wound.  The patient is also on ongoing treatment for metastatic breast cancer.  The patient has been working with wound care on an ongoing basis.   Review of Systems  Cardiovascular:  Positive for leg swelling.  Skin:  Positive for wound.  All other systems reviewed and are negative.     Objective:   Physical Exam Vitals reviewed.  HENT:     Head: Normocephalic.  Cardiovascular:     Rate and Rhythm: Normal rate.     Pulses: Normal pulses.  Pulmonary:     Effort: Pulmonary effort is normal.  Skin:    General: Skin is warm and dry.  Neurological:     Mental Status: She is alert and oriented to person, place, and time.  Psychiatric:        Mood and Affect: Mood normal.        Behavior: Behavior normal.        Thought Content: Thought content normal.        Judgment:  Judgment normal.    BP 123/72 (BP Location: Right Arm)   Pulse 93   Resp 16   Ht $R'5\' 3"'Zv$  (1.6 m)   Wt 153 lb 12.8 oz (69.8 kg)   BMI 27.24 kg/m   Past Medical History:  Diagnosis Date   ADHD (attention deficit hyperactivity disorder)    in [place], no date on md note   Anemia    IDA 11/26/2019, Anemia in stage 4 chronic kidney disease ([place]) 09/03/2020   Arthritis    osteoarthritis right knee 09/30/2014   Breast cancer ([place]) 10/05/2013   in [place] +, [place] -, Her 2 is 3+   Cancer related pain 03/28/2021   in [name], md notes spine mets   DVT of lower extremity, bilateral ([place]) 03/30/2014   in [name]   Generalized muscle weakness 03/31/2016   in [name]   GERD (gastroesophageal reflux disease) 05/15/2013   per md in [name]   Hyperparathyroidism, secondary ([place]) 04/25/2018   in [name]   Hypertension 02/20/2016   info from MD in [facility]   Lumbar compression fracture ([place]) 03/18/2021   in [place] loss 02/05/2015   in [name]   Metabolic acidosis 06/77/0340   in [place]   Metabolic syndrome 35/24/8185  in [place]   Osteopenia after menopause 03/29/2016   in [place] of both eyes 09/30/2012   per md in [name] where pt. 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   in [name]   Stasis ulcer of left lower extremity ([place]) 03/29/2016   in [place] History   Socioeconomic History   Marital status: Divorced    Spouse name: Not on file   Number of children: Not on file   Years of education: Not on file   Highest education level: Not on file  Occupational History   Occupation: retired Teacher, music    Comment: In [place]  Tobacco Use   Smoking status: Never   Smokeless tobacco: Never  Vaping Use   Vaping Use: Never used  Substance and Sexual Activity   Alcohol use: Not Currently   Drug use: Never   Sexual activity: Not Currently  Other Topics Concern   Not on file  Social History Narrative   Not on file   Social Determinants of Health    Financial Resource Strain: Not on file  Food Insecurity: Not on file  Transportation Needs: Not on file  Physical Activity: Not on file  Stress: Not on file  Social Connections: Not on file  Intimate Partner Violence: Not on file    Past Surgical History:  Procedure Laterality Date   CESAREAN SECTION     unknown   fibroid removed  N/A    in [place] - unknown date   IR FLUORO GUIDE CV LINE LEFT  07/27/2021   IR PORT REPAIR CENTRAL VENOUS ACCESS DEVICE Left    In [place] N/A 02/25/2014   in [name]   ovary removed      unknown   [place] CATARACT EXTRACAP,INSERT LENS Bilateral  Bilateral 09/05/2012   in [place]     unknown    LUMPECTOMY Right 04/03/2014   in [place]    Family History  Problem Relation Age of Onset   Pancreatic cancer Mother    Stroke Father    Diabetes Father    Hypertension Father    Heart disease Father    Skin cancer Father    Varicose Veins Father    Skin cancer Brother    Cancer - Prostate Brother     Allergies  Allergen Reactions   Corticosteroids Other (See Comments)    Pt trf from [name] and per primary md for her cancer tx. Notes that it causes agitation intolerance   Sulfa Antibiotics Other (See Comments)    Pt moved from [name] and in MD notes she has allergy but we do not know reactions when taking the drug   Celebrex [Celecoxib] Rash    CBC Latest Ref Rng & Units 10/28/2021 10/21/2021 10/14/2021  WBC 4.0 - 10.5 K/uL 4.1 4.0 5.5  Hemoglobin 12.0 - 15.0 g/dL 9.6(L) 10.3(L) 9.6(L)  Hematocrit 36.0 - 46.0 % 30.8(L) 32.9(L) 31.3(L)  Platelets 150 - 400 K/uL 234 229 254      CMP     Component Value Date/Time   NA 137 10/28/2021 0850   K 4.4 10/28/2021 0850   CL 105 10/28/2021 0850   CO2 22 10/28/2021 0850   GLUCOSE 115 (H) 10/28/2021 0850   BUN 28 (H) 10/28/2021 0850   CREATININE 1.18 (H) 10/28/2021 0850   CALCIUM 8.8 (L) 10/28/2021 0850   PROT 6.6 10/28/2021 0850   ALBUMIN 3.7 10/28/2021  0850   AST 22  10/28/2021 0850   ALT 13 10/28/2021 0850   ALKPHOS 64 10/28/2021 0850   BILITOT 0.1 (L) 10/28/2021 0850   GFRNONAA 48 (L) 10/28/2021 0850     No results found.     Assessment & Plan:   1. Chronic ulcer of lower extremity, left, with unspecified severity (HCC) The patient has previously had ABIs done that only by our office for previously in [place] and they have not shown any evidence of obvious arterial perfusion issues.  She has not had a venous reflux study done in several years.  It would be prudent to evaluate if there are venous reflux issues that could possibly be repaired that can help her wound healing.  If there is no evidence of venous reflux, there is little vascular intervention that would be helpful and continued wound care follow-up would be in the patient's best interest.  The patient also has a concern that this may be due to a lingering fungal infection, which may be followed up with the patient has her mammogram otology involvement.   Current Outpatient Medications on File Prior to Visit  Medication Sig Dispense Refill   acetaminophen (TYLENOL) 500 MG tablet 1 tablet as needed     amphetamine-dextroamphetamine (ADDERALL XR) 30 MG 24 hr capsule Take 1 capsule (30 mg total) by mouth daily. 30 capsule 0   Calcium 200 MG TABS Take 1 tablet by mouth daily.     fentaNYL (DURAGESIC) 12 MCG/HR Place 1 patch onto the skin every 3 (three) days. 1 patch to skin 10 patch 0   gabapentin (NEURONTIN) 400 MG capsule 1 capsule     gentamicin cream (GARAMYCIN) 0.1 % Apply topically daily.     lisinopril (ZESTRIL) 20 MG tablet 1 tablet     LORazepam (ATIVAN) 0.5 MG tablet Take 1 tablet (0.5 mg total) by mouth daily as needed for anxiety. 30 tablet 0   Multiple Vitamins-Minerals (MULTIVITAMIN ADULTS) TABS See admin instructions.     nystatin (MYCOSTATIN) 100000 UNIT/ML suspension Take 5 mLs by mouth 4 (four) times daily.     Oxycodone HCl 10 MG TABS TAKE ONE TABLET BY MOUTH EVERY 4 HOURS AS  NEEDED 84 tablet 0   rivaroxaban (XARELTO) 20 MG TABS tablet Take 1 tablet (20 mg total) by mouth daily with supper. 30 tablet 3   SANTYL ointment Apply topically as directed.     terbinafine (LAMISIL AT) 1 % cream Apply 1 application topically 2 (two) times daily. 30 g 5   zolpidem (AMBIEN) 5 MG tablet Take 1 tablet (5 mg total) by mouth at bedtime. 30 tablet 0   cyclobenzaprine (FLEXERIL) 10 MG tablet Take by mouth. (Patient not taking: Reported on 09/30/2021)     naloxone [facility]) nasal spray 4 mg/0.1 mL Spray entire contents of inhaler into nostril for suspected opioid overdose.  Call 911.  Repeat in other nostril in 2-3 minutes if needed. (Patient not taking: Reported on 10/21/2021)     NARCAN 4 MG/0.1ML LIQD nasal spray kit SMARTSIG:1 Spray(s) Both Nares Once PRN (Patient not taking: Reported on 09/30/2021)     ondansetron (ZOFRAN-ODT) 8 MG disintegrating tablet 1 tablet on the tongue and allow to dissolve  as needed (Patient not taking: Reported on 09/30/2021)     potassium chloride SA (KLOR-CON) 20 MEQ tablet Take 1 tablet (20 mEq total) by mouth daily. (Patient not taking: Reported on 09/30/2021) 7 tablet 0   Current Facility-Administered Medications on File Prior to Visit  Medication Dose Route Frequency Provider Last Rate Last Admin   heparin lock flush 100 UNIT/ML injection            sodium chloride flush (NS) 0.9 % injection 10 mL  10 mL Intracatheter PRN [name], MD        There are no Patient Instructions on file for this visit. No follow-ups on file.   Kris Hartmann, NP

## 2021-11-02 ENCOUNTER — Encounter (HOSPITAL_BASED_OUTPATIENT_CLINIC_OR_DEPARTMENT_OTHER): Payer: Medicare Other | Admitting: Internal Medicine

## 2021-11-02 DIAGNOSIS — S91101D Unspecified open wound of right great toe without damage to nail, subsequent encounter: Secondary | ICD-10-CM

## 2021-11-02 DIAGNOSIS — S81802A Unspecified open wound, left lower leg, initial encounter: Secondary | ICD-10-CM | POA: Diagnosis not present

## 2021-11-02 DIAGNOSIS — L97822 Non-pressure chronic ulcer of other part of left lower leg with fat layer exposed: Secondary | ICD-10-CM | POA: Diagnosis not present

## 2021-11-02 NOTE — Progress Notes (Signed)
AMIEE, WILEY ([id number]) Visit Report for 11/02/2021 Arrival Information Details Patient Name: Laura Santiago, Laura Santiago. Date of Service: 11/02/2021 10:45 AM Medical Record Number: [id number] Patient Account Number: [id number] Date of Birth/Sex: Sep 02, 1946 (75 y.o. F) Treating RN: Cornell Barman Primary Care Tykel Badie: SYSTEM, PCP Other Clinician: Referring Candice Tobey: Randa Evens Treating Brittain Hosie/Extender: Yaakov Guthrie in Treatment: 14 Visit Information History Since Last Visit Added or deleted any medications: No Patient Arrived: Walker Has Dressing in Place as Prescribed: No Arrival Time: 11:13 Has Compression in Place as Prescribed: Yes Accompanied By: service dog Pain Present Now: No Transfer Assistance: None Patient Identification Verified: Yes Secondary Verification Process Completed: Yes Patient Has Alerts: Yes Patient Alerts: PT HAS SERVICE ANIMAL ABI 07/11/21 R) 1.16 L) 1.27 Electronic Signature(s) Signed: 11/02/2021 3:24:16 PM By: Gretta Cool, BSN, RN, CWS, [name] RN, BSN Entered By: Gretta Cool, BSN, RN, CWS, [name] on 11/02/2021 15:24:16 Laura Santiago ([id number]) -------------------------------------------------------------------------------- Compression Therapy Details Patient Name: Laura Santiago. Date of Service: 11/02/2021 10:45 AM Medical Record Number: [id number] Patient Account Number: [id number] Date of Birth/Sex: 1946/10/01 (75 y.o. F) Treating RN: Cornell Barman Primary Care Leveon Pelzer: SYSTEM, PCP Other Clinician: Referring Molly Savarino: Randa Evens Treating Kee Drudge/Extender: Yaakov Guthrie in Treatment: 17 Compression Therapy Performed for Wound Assessment: Wound #1 Left,Medial Lower Leg Performed By: Clinician Cornell Barman, RN Compression Type: Four Layer Post Procedure Diagnosis Same as Pre-procedure Electronic Signature(s) Signed: 11/02/2021 3:24:52 PM By: Gretta Cool, BSN, RN, CWS, [name] RN, BSN Entered By: Gretta Cool, BSN, RN, CWS, [name] on 11/02/2021  11:53:22 Laura Santiago ([id number]) -------------------------------------------------------------------------------- Compression Therapy Details Patient Name: Laura Santiago, Laura Santiago. Date of Service: 11/02/2021 10:45 AM Medical Record Number: [id number] Patient Account Number: [id number] Date of Birth/Sex: [birth year] (75 y.o. F) Treating RN: Cornell Barman Primary Care Matea Stanard: SYSTEM, PCP Other Clinician: Referring Berlie Hatchel: Randa Evens Treating Tonnette Zwiebel/Extender: Yaakov Guthrie in Treatment: 17 Compression Therapy Performed for Wound Assessment: Wound #5 Left,Lateral Lower Leg Performed By: Clinician Cornell Barman, RN Compression Type: Four Layer Post Procedure Diagnosis Same as Pre-procedure Electronic Signature(s) Signed: 11/02/2021 3:24:52 PM By: Gretta Cool, BSN, RN, CWS, [name] RN, BSN Entered By: Gretta Cool, BSN, RN, CWS, [name] on 11/02/2021 11:53:22 Laura Santiago, Laura Santiago ([id number]) -------------------------------------------------------------------------------- Encounter Discharge Information Details Patient Name: Laura Santiago, Laura Santiago. Date of Service: 11/02/2021 10:45 AM Medical Record Number: [id number] Patient Account Number: [id number] Date of Birth/Sex: 10-Aug-1946 (75 y.o. F) Treating RN: Cornell Barman Primary Care Rosalyn Archambault: SYSTEM, PCP Other Clinician: Referring Coralyn Roselli: Randa Evens Treating Lailany Enoch/Extender: Yaakov Guthrie in Treatment: 17 Encounter Discharge Information Items Post Procedure Vitals Discharge Condition: Stable Temperature (F): 98.6 Ambulatory Status: Walker Pulse (bpm): 88 Discharge Destination: Home Respiratory Rate (breaths/min): 16 Transportation: Private Auto Blood Pressure (mmHg): 122/75 Schedule Follow-up Appointment: Yes Clinical Summary of Care: Electronic Signature(s) Signed: 11/02/2021 3:24:52 PM By: Gretta Cool, BSN, RN, CWS, [name] RN, BSN Entered By: Gretta Cool, BSN, RN, CWS, [name] on 11/02/2021 12:35:21 Laura Santiago  ([id number]) -------------------------------------------------------------------------------- Lower Extremity Assessment Details Patient Name: Laura Santiago, Laura Santiago. Date of Service: 11/02/2021 10:45 AM Medical Record Number: [id number] Patient Account Number: [id number] Date of Birth/Sex: 06/11/1946 (75 y.o. F) Treating RN: Cornell Barman Primary Care Kashif Pooler: SYSTEM, PCP Other Clinician: Referring Vinette Crites: Randa Evens Treating Shadana Pry/Extender: Yaakov Guthrie in Treatment: 17 Edema Assessment Assessed: [Left: No] [Right: No] [Left: Edema] [Right: :] Calf Left: Right: Point of Measurement: 29 cm From Medial Instep 33 cm Ankle Left: Right: Point of Measurement: 10 cm From Medial Instep 25 cm Vascular Assessment Pulses: Dorsalis Pedis Palpable: [Left:Yes] [Right:Yes] Electronic Signature(s) Signed: 11/02/2021  3:24:52 PM By: Gretta Cool, BSN, RN, CWS, [name] RN, BSN Entered By: Gretta Cool, BSN, RN, CWS, [name] on 11/02/2021 11:33:04 Laura Santiago, Laura Santiago ([id number]) -------------------------------------------------------------------------------- Multi Wound Chart Details Patient Name: Laura Santiago, Laura Santiago. Date of Service: 11/02/2021 10:45 AM Medical Record Number: [id number] Patient Account Number: [id number] Date of Birth/Sex: 05/29/1946 (75 y.o. 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, Other Comorbid History: N/A Hypertension, Osteoarthritis, Hypertension,  Osteoarthritis, Received Chemotherapy, Received Received Chemotherapy, Received Radiation Radiation Date Acquired: 04/06/2021 08/24/2021 10/10/2021 Weeks of Treatment: 17 10 0 Wound Status: Open Open Open Clustered Wound: Yes No Yes Clustered Quantity: N/A N/A 2 Measurements L x W x D (cm) 9.5x10x0.5 0.2x0.2x0.5 3x1x0.1 Area (cm) : 74.613 0.031 2.356 Volume (cm) : 37.306 0.016 0.236 % Reduction in Area: 17.40% 80.30% 0.00% % Reduction in Volume: -106.50% 74.60% 0.00% Starting Position 1 (o'clock): 12 Ending Position 1 (o'clock): 12 Maximum Distance 1 (cm): 0.5 Undermining: N/A Yes N/A Classification: Full Thickness Without Exposed Full Thickness Without Exposed Partial Thickness Support Structures Support Structures Exudate Amount: Large Medium Small Exudate Type: Serosanguineous Serous Serous Exudate Color: red, brown amber amber Wound Margin: N/A Thickened N/A Granulation Amount: N/A Large (67-100%) Large (67-100%) Granulation Quality: N/A Red Pink Necrotic Amount: N/A Small (1-33%) Small (1-33%) Epithelialization: N/A None Small (1-33%) Debridement: Debridement - Excisional N/A N/A Pre-procedure Verification/Time 11:50 N/A N/A Out Taken: Tissue Debrided: Subcutaneous, Slough N/A N/A Level: Skin/Subcutaneous Tissue N/A N/A Debridement Area (sq cm): 95 N/A N/A Instrument: Curette N/A N/A Bleeding: Moderate N/A N/A Hemostasis Achieved: Pressure N/A N/A Debridement Treatment Procedure was tolerated well N/A N/A Response: 9.5x10x0.5 N/A N/A Laura Santiago, Laura Santiago ([id number]) Post Debridement Measurements L x W x D (cm) Post Debridement Volume: 37.306 N/A N/A (cm) Procedures Performed: Compression Therapy N/A Compression Therapy Debridement Treatment Notes Electronic Signature(s) Signed: 11/02/2021 12:47:52 PM By: Kalman Shan DO Entered By: Kalman Shan on 11/02/2021 12:22:57 Laura Santiago  ([id number]) -------------------------------------------------------------------------------- Multi-Disciplinary Care Plan Details Patient Name: Laura Santiago. Date of Service: 11/02/2021 10:45 AM Medical Record Number: [id number] Patient Account Number: [id number] Date of Birth/Sex: [birth year] (75 y.o. F) Treating RN: Cornell Barman Primary Care Tiwanda Threats: SYSTEM, PCP Other Clinician: Referring Jarelly Rinck: Randa Evens Treating Marlow Berenguer/Extender: Yaakov Guthrie in Treatment: 17 Active Inactive Wound/Skin Impairment Nursing Diagnoses: Impaired tissue integrity Goals: Patient/caregiver will verbalize understanding of skin care regimen Date Initiated: 07/06/2021 Date Inactivated: 07/27/2021 Target Resolution Date: 07/06/2021 Goal Status: Met Ulcer/skin breakdown will have a volume reduction of 30% by week 4 Date Initiated: 07/06/2021 Date Inactivated: 10/12/2021 Target Resolution Date: 08/06/2021 Goal Status: Unmet Unmet Reason: cont tx Ulcer/skin breakdown will have a volume reduction of 50% by week 8 Date Initiated: 07/06/2021 Target Resolution Date: 09/06/2021 Goal Status: Active Ulcer/skin breakdown will have a volume reduction of 80% by week 12 Date Initiated: 07/06/2021 Target Resolution Date: 10/06/2021 Goal Status: Active Ulcer/skin breakdown will heal within 14 weeks Date Initiated: 07/06/2021 Target Resolution Date: 11/06/2021 Goal Status: Active Interventions: Assess patient/caregiver ability to obtain necessary supplies Assess patient/caregiver ability to perform ulcer/skin care regimen upon admission and as needed Assess ulceration(s) every visit Treatment Activities: Referred to DME [place]  for dressing supplies : 07/06/2021 Skin care regimen initiated : 07/06/2021 Notes: Electronic Signature(s) Signed: 11/02/2021 3:24:52 PM By: Gretta Cool, BSN, RN, CWS, [name] RN, BSN Entered By: Gretta Cool, BSN, RN, CWS, [name] on 11/02/2021 11:49:01 KANIJA, REMMEL  ([id number]) -------------------------------------------------------------------------------- Pain Assessment Details Patient Name: Laura Santiago, Laura Santiago. Date of Service: 11/02/2021 10:45 AM Medical Record Number: [id number] Patient Account Number: [id number] Date of Birth/Sex: [birth year] (75 y.o. F) Treating RN: Cornell Barman Primary Care Kilan Banfill: SYSTEM, PCP Other Clinician: Referring Namon Villarin: Randa Evens Treating Modena Bellemare/Extender: Yaakov Guthrie in Treatment: 17 Active Problems Location of Pain Severity and Description of Pain Patient Has Paino No Site Locations Pain Management and Medication Current Pain Management: Notes Patient denies pain at this time. Electronic Signature(s) Signed: 11/02/2021 3:24:52 PM By: Gretta Cool, BSN, RN, CWS, [name] RN, BSN Entered By: Gretta Cool, BSN, RN, CWS, [name] on 11/02/2021 11:24:52 Laura Santiago, Laura Santiago ([id number]) -------------------------------------------------------------------------------- Patient/Caregiver Education Details Patient Name: KERRIANN, KAMPHUIS. Date of Service: 11/02/2021 10:45 AM Medical Record Number: [id number] Patient Account Number: [id number] Date of Birth/Gender: September 14, 1946 (75 y.o. F) Treating RN: Cornell Barman Primary Care Physician: SYSTEM, PCP Other Clinician: Referring Physician: Randa Evens Treating Physician/Extender: Yaakov Guthrie in Treatment: 17 Education Assessment Education Provided To: Patient Education Topics Provided Wound/Skin Impairment: Handouts: Caring for Your Ulcer Methods: Demonstration, Explain/Verbal Responses: State content correctly Electronic Signature(s) Signed: 11/02/2021 3:24:52 PM By: Gretta Cool, BSN, RN, CWS, [name] RN, BSN Entered By: Gretta Cool, BSN, RN, CWS, [name] on 11/02/2021 12:32:38 Laura Santiago ([id number]) -------------------------------------------------------------------------------- Wound Assessment Details Patient Name: Laura Santiago, Laura Santiago. Date of Service: 11/02/2021 10:45  AM Medical Record Number: [id number] Patient Account Number: [id number] Date of Birth/Sex: [birth year] (75 y.o. F) Treating RN: Cornell Barman Primary Care Wilsie Kern: SYSTEM, PCP Other Clinician: Referring Ada Holness: Randa Evens Treating Thamara Leger/Extender: Yaakov Guthrie in Treatment: 17 Wound Status Wound Number: 1 Primary Etiology: Venous Leg Ulcer Wound Location: Left, Medial Lower Leg Wound Status: Open Wounding Event: Gradually Appeared Date Acquired: 04/06/2021 Weeks Of Treatment: 17 Clustered Wound: Yes Photos Photo Uploaded By: Gretta Cool, BSN, RN, CWS, [name] on 11/02/2021 11:43:52 Wound Measurements Length: (cm) 9.5 Width: (cm) 10 Depth: (cm) 0.5 Area: (cm) 74.613 Volume: (cm) 37.306 % Reduction in Area: 17.4% % Reduction in Volume: -106.5% Wound Description Classification: Full Thickness Without Exposed Support Structu Exudate Amount: Large Exudate Type: Serosanguineous Exudate Color: red, brown res Treatment Notes Wound #1 (Lower Leg) Wound Laterality: Left, Medial Cleanser Soap and Water Discharge Instruction: Gently cleanse wound with antibacterial soap, rinse and pat dry prior to dressing wounds Peri-Wound Care Desitin Maximum Strength Ointment 4 (oz) Discharge Instruction: Apply periwound Topical Primary Dressing Gauze Discharge Instruction: As directed: moistened with [place], [place] ([id number]) Secondary Dressing Xtrasorb Large 6x9 (in/in) Discharge Instruction: Apply to wound as directed. Do not cut. Secured With Compression Wrap Medichoice 4 layer Compression System, 35-40 mmHG Discharge Instruction: Apply multi-layer wrap as directed. Compression Stockings Environmental education officer) Signed: 11/02/2021 3:24:52 PM By: Gretta Cool, BSN, RN, CWS, [name] RN, BSN Entered By: Gretta Cool, BSN, RN, CWS, [name] on 11/02/2021 11:30:40 NELLIE, PESTER  ([id number]) -------------------------------------------------------------------------------- Wound Assessment Details Patient Name: Laura Santiago, HABECK. Date of Service: 11/02/2021 10:45 AM Medical Record Number: [id number] Patient Account Number: [id number] Date of Birth/Sex: 1946/02/21 (75 y.o. F) Treating RN: Cornell Barman Primary Care Ashok Sawaya: SYSTEM, PCP Other Clinician: Referring Breon Diss: Randa Evens Treating Quentin Strebel/Extender: Yaakov Guthrie in Treatment: 17 Wound Status Wound Number: 2 Primary Neuropathic Ulcer-Non Diabetic Etiology: Wound Location: Right Toe Great Wound Status: Open Wounding Event: Gradually  Appeared Comorbid Hypertension, Osteoarthritis, Received Chemotherapy, Date Acquired: 08/24/2021 History: Received Radiation Weeks Of Treatment: 10 Clustered Wound: No Photos Wound Measurements Length: (cm) 0.2 Width: (cm) 0.2 Depth: (cm) 0.5 Area: (cm) 0.031 Volume: (cm) 0.016 % Reduction in Area: 80.3% % Reduction in Volume: 74.6% Epithelialization: None Undermining: Yes Starting Position (o'clock): 12 Ending Position (o'clock): 12 Maximum Distance: (cm) 0.5 Wound Description Classification: Full Thickness Without Exposed Support Structu Wound Margin: Thickened Exudate Amount: Medium Exudate Type: Serous Exudate Color: amber res Foul Odor After Cleansing: No Slough/Fibrino Yes Wound Bed Granulation Amount: Large (67-100%) Exposed Structure Granulation Quality: Red Fascia Exposed: No Necrotic Amount: Small (1-33%) Fat Layer (Subcutaneous Tissue) Exposed: Yes Necrotic Quality: Adherent Slough Tendon Exposed: No Muscle Exposed: No Joint Exposed: No Bone Exposed: No Treatment Notes Wound #2 (Toe Great) Wound Laterality: Right Cleanser Wound Cleanser SAMAURI, KELLENBERGER ([id number]) Discharge Instruction: Wash your hands with soap and water. Remove old dressing, discard into plastic bag and place into trash. Cleanse the wound with Wound  Cleanser prior to applying a clean dressing using gauze sponges, not tissues or cotton balls. Do not scrub or use excessive force. Pat dry using gauze sponges, not tissue or cotton balls. Peri-Wound Care Topical Primary Dressing Silvercel Small 2x2 (in/in) Discharge Instruction: Apply Silvercel Small 2x2 (in/in) as instructed Secondary Dressing Gauze Discharge Instruction: Cover with dry gauze Secured With 26M [place] [facility] Tape, 2x2 (in/yd) Discharge Instruction: Secure dressing Compression Wrap Compression Stockings Add-Ons Electronic Signature(s) Signed: 11/02/2021 3:24:52 PM By: Gretta Cool, BSN, RN, CWS, [name] RN, BSN Entered By: Gretta Cool, BSN, RN, CWS, [name] on 11/02/2021 11:28:46 BRIGETTE, HOPFER ([id number]) -------------------------------------------------------------------------------- Wound Assessment Details Patient Name: CHARLETTA, VOIGHT. Date of Service: 11/02/2021 10:45 AM Medical Record Number: [id number] Patient Account Number: [id number] Date of Birth/Sex: [birth year] (75 y.o. F) Treating RN: Cornell Barman Primary Care Versie Soave: SYSTEM, PCP Other Clinician: Referring Hoke Baer: Randa Evens Treating Daneisha Surges/Extender: Yaakov Guthrie in Treatment: 17 Wound Status Wound Number: 5 Primary Trauma, Other Etiology: Wound Location: Left, Lateral Lower Leg Wound Status: Open Wounding Event: Trauma Comorbid Hypertension, Osteoarthritis, Received Chemotherapy, Date Acquired: 10/10/2021 History: Received Radiation Weeks Of Treatment: 0 Clustered Wound: Yes Photos Wound Measurements Length: (cm) 3 Width: (cm) 1 Depth: (cm) 0.1 Clustered Quantity: 2 Area: (cm) 2.35 Volume: (cm) 0.23 % Reduction in Area: 0% % Reduction in Volume: 0% Epithelialization: Small (1-33%) 6 6 Wound Description Classification: Partial Thickness Exudate Amount: Small Exudate Type: Serous Exudate Color: amber Wound Bed Granulation Amount: Large (67-100%) Exposed  Structure Granulation Quality: Pink Fascia Exposed: No Necrotic Amount: Small (1-33%) Fat Layer (Subcutaneous Tissue) Exposed: Yes Necrotic Quality: Adherent Slough Tendon Exposed: No Muscle Exposed: No Joint Exposed: No Bone Exposed: No Treatment Notes Wound #5 (Lower Leg) Wound Laterality: Left, Lateral Cleanser Peri-Wound Care Topical Santyl Collagenase Ointment, 30 (gm), tube ALLANA, SHRESTHA ([id number]) Discharge Instruction: in office only Primary Dressing Hydrofera Blue Ready Transfer Foam, 2.5x2.5 (in/in) Discharge Instruction: Apply Hydrofera Blue Ready to wound bed as directed Secondary Dressing Secured With Compression Wrap Medichoice 4 layer Compression System, 35-40 mmHG Discharge Instruction: Apply multi-layer wrap as directed. Compression Stockings Environmental education officer) Signed: 11/02/2021 3:24:52 PM By: Gretta Cool, BSN, RN, CWS, [name] RN, BSN Entered By: Gretta Cool, BSN, RN, CWS, [name] on 11/02/2021 11:36:48 JUDAEA, BURGOON ([id number]) -------------------------------------------------------------------------------- [place] Details Patient Name: HERMENA, SWINT. Date of Service: 11/02/2021 10:45 AM Medical Record Number: [id number] Patient Account Number: [id number] Date of Birth/Sex: Nov 29, 1946 (75 y.o. F) Treating RN: Cornell Barman Primary Care Kearra Calkin:  SYSTEM, PCP Other Clinician: Referring Nishi Neiswonger: Randa Evens Treating Virna Livengood/Extender: Yaakov Guthrie in Treatment: 17 Vital Signs Time Taken: 11:24 Temperature (F): 98.6 Height (in): 66 Pulse (bpm): 88 Weight (lbs): 153 Respiratory Rate (breaths/min): 16 Body Mass Index (BMI): 24.7 Blood Pressure (mmHg): 122/75 Reference Range: 80 - 120 mg / dl Electronic Signature(s) Signed: 11/02/2021 3:24:52 PM By: Gretta Cool, BSN, RN, CWS, [name] RN, BSN Entered By: Gretta Cool, BSN, RN, CWS, [name] on 11/02/2021 11:24:31

## 2021-11-02 NOTE — Progress Notes (Signed)
KISSA, CAMPOY ([id number]) Visit Report for 11/02/2021 Chief Complaint Document Details Patient Name: Laura Santiago, Laura Santiago. Date of Service: 11/02/2021 10:45 AM Medical Record Number: [id number] Patient Account Number: [id number] Date of Birth/Sex: Jan 07, 1946 (75 y.o. F) Treating RN: Cornell Barman Primary Care Provider: SYSTEM, PCP Other Clinician: Referring Provider: Randa Evens Treating Provider/Extender: Yaakov Guthrie in Treatment: 17 Information Obtained from: Patient Chief Complaint Left lower extremity wound Right toe wounds Left upper lateral thigh wounds Electronic Signature(s) Signed: 11/02/2021 12:47:52 PM By: Kalman Shan DO Entered By: Kalman Shan on 11/02/2021 12:23:14 Laura Santiago ([id number]) -------------------------------------------------------------------------------- Debridement Details Patient Name: Laura Santiago. Date of Service: 11/02/2021 10:45 AM Medical Record Number: [id number] Patient Account Number: [id number] Date of Birth/Sex: 08-11-1946 (75 y.o. F) Treating RN: Cornell Barman Primary Care Provider: SYSTEM, PCP Other Clinician: Referring Provider: Randa Evens Treating Provider/Extender: Yaakov Guthrie in Treatment: 17 Debridement Performed for Wound #1 Left,Medial Lower Leg Assessment: Performed By: Physician Kalman Shan, MD Debridement Type: Debridement Severity of Tissue Pre Debridement: Fat layer exposed Level of Consciousness (Pre- Awake and Alert procedure): Pre-procedure Verification/Time Out Yes - 11:50 Taken: Total Area Debrided (L x W): 9.5 (cm) x 10 (cm) = 95 (cm) Tissue and other material Viable, Non-Viable, Slough, Subcutaneous, Biofilm, Slough debrided: Level: Skin/Subcutaneous Tissue Debridement Description: Excisional Instrument: Curette Bleeding: Moderate Hemostasis Achieved: Pressure Response to Treatment: Procedure was tolerated well Level of Consciousness (Post- Awake and  Alert procedure): Post Debridement Measurements of Total Wound Length: (cm) 9.5 Width: (cm) 10 Depth: (cm) 0.5 Volume: (cm) 37.306 Character of Wound/Ulcer Post Debridement: Stable Severity of Tissue Post Debridement: Fat layer exposed Post Procedure Diagnosis Same as Pre-procedure Electronic Signature(s) Signed: 11/02/2021 12:47:52 PM By: Kalman Shan DO Signed: 11/02/2021 3:24:52 PM By: Gretta Cool, BSN, RN, CWS, [name] RN, BSN Entered By: Gretta Cool, BSN, RN, CWS, [name] on 11/02/2021 11:51:15 Laura Santiago, Laura Santiago ([id number]) -------------------------------------------------------------------------------- HPI Details Patient Name: Laura Santiago. Date of Service: 11/02/2021 10:45 AM Medical Record Number: [id number] Patient Account Number: [id number] Date of Birth/Sex: [birth year] (75 y.o. F) Treating RN: Cornell Barman Primary Care Provider: SYSTEM, PCP Other Clinician: Referring Provider: Randa Evens Treating Provider/Extender: Yaakov Guthrie in Treatment: 17 History of Present Illness HPI Description: Admission 7/27 Ms. Lilinoe Acklin is a 75 year old female with a past medical history of ADHD, metastatic breast cancer, stage IV chronic kidney disease, history of DVT on Xarelto and chronic venous insufficiency that presents to the clinic for a chronic left lower extremity wound. She recently moved to [facility] 4 days ago. She was being followed by wound care center in [name]. She reports a 10-year history of wounds to her left lower extremity that eventually do heal with debridement and compression therapy. She states that the current wound reopened 4 months ago and she is using Vaseline and Coban. She denies signs of infection. 8/3; patient presents for 1 week follow-up. She reports no issues or complaints today. She states she had vascular studies done in the last week. She denies signs of infection. She brought her little service dog with her today. 8/17; patient  presents for follow-up. She has missed her last clinic appointment. She states she took the wrap off and attempted to rewrap her leg. She is having difficulty with transportation. She has her service dog with her today. Overall she feels well and reports improvement in wound healing. She denies signs of infection. She reports owning an old Velcro wrap compression and has this at her living facility 9/14; patient presents for  follow-up. Patient states that over the past 2 to 3 weeks she developed toe wounds to her right foot. She attributes this to tight fitting shoes. She subsequently developed cellulitis in the right leg and has been treated by doxycycline by her oncologist. She reports improvement in symptoms however continues to have some redness and swelling to this leg. To the left lower extremity patient has been having her wraps changed with home health twice weekly. She states that the [facility] is not helping control the drainage. Other than that she has no issues or complaints today. She denies signs of infection to the left lower extremity. 9/21; patient presents for follow-up. She reports seeing infectious disease for her cellulitis. She reports no further management. She has home health that changes the wraps twice weekly. She has no issues or complaints today. She denies signs of infection. 10/5; patient presents for follow-up. She has no issues or complaints today. She denies signs of infection. She states that the right great toe has not been dressed by home health. 10/12; patient presents for follow-up. She has no issues or complaints today. She reports improvement in her wound healing. She has been using silver alginate to the right great toe wound. She denies signs of infection. 10/26; patient presents for follow-up. Home health did not have sorbact so they continued to use Hydrofera Blue under the wrap. She has been using silver alginate to the great toe wound however she did  not have a dressing in place today. She currently denies signs of infection. 11/2; patient presents for follow-up. She has been using sorb act under the compression wrap. She reports using silver alginate to the toe wound again she does not have a dressing in place. She currently denies signs of infection. 11/23; patient presents for follow-up. Unfortunately she has missed her last 2 clinic appointments. She was last seen 3 weeks ago. She did her own compression wrap with Kerlix and Coban yesterday after seeing vein and vascular. She has not been dressing her right great toe wound. She currently denies signs of infection. Electronic Signature(s) Signed: 11/02/2021 12:47:52 PM By: Kalman Shan DO Entered By: Kalman Shan on 11/02/2021 12:33:20 Laura Santiago ([id number]) -------------------------------------------------------------------------------- Physical Exam Details Patient Name: Laura Santiago, Laura Santiago. Date of Service: 11/02/2021 10:45 AM Medical Record Number: [id number] Patient Account Number: [id number] Date of Birth/Sex: 01/13/1946 (75 y.o. F) Treating RN: Cornell Barman Primary Care Provider: SYSTEM, PCP Other Clinician: Referring Provider: Randa Evens Treating Provider/Extender: Yaakov Guthrie in Treatment: 17 Constitutional . Cardiovascular . Psychiatric . Notes Left lower extremity: Large open wound to the medial aspect with nonviable tissue and granulation tissue present. Right foot: To the right great toe there is an open wound with circumferential callus and granulation tissue present postdebridement. To the left lateral thigh there are 2 wounds with a layer of dried fluid. No obvious signs of soft tissue infection. Electronic Signature(s) Signed: 11/02/2021 12:47:52 PM By: Kalman Shan DO Entered By: Kalman Shan on 11/02/2021 12:34:38 Laura Santiago  ([id number]) -------------------------------------------------------------------------------- Physician Orders Details Patient Name: Laura Santiago. Date of Service: 11/02/2021 10:45 AM Medical Record Number: [id number] Patient Account Number: [id number] Date of Birth/Sex: February 27, 1946 (75 y.o. F) Treating RN: Cornell Barman Primary Care Provider: SYSTEM, PCP Other Clinician: Referring Provider: Randa Evens Treating Provider/Extender: Yaakov Guthrie in Treatment: 36 Verbal / Phone Orders: No Diagnosis Coding Follow-up Appointments o Return Appointment in 1 week. o Nurse Visit as needed [place] o  [place] for wound care. May utilize formulary equivalent dressing for wound treatment orders unless otherwise specified. Home Health Nurse may visit PRN to address patientos wound care needs. o Scheduled days for dressing changes to be completed; exception, patient has scheduled wound care visit that day. - Twice a week, patient comes to office on Wednesday o **Please direct any NON-WOUND related issues/requests for orders to patient's Primary Care Physician. **If current dressing causes regression in wound condition, may D/C ordered dressing product/s and apply Normal Saline Moist Dressing daily until next [place] or Other MD appointment. **Notify [facility] of regression in wound condition at [phone]. Bathing/ Shower/ Hygiene o May shower with wound dressing protected with water repellent cover or cast protector. o No tub bath. Edema Control - Lymphedema / Segmental Compressive Device / Other o Elevate leg(s) parallel to the floor when sitting. o DO YOUR BEST to sleep in the bed at night. DO NOT sleep in your recliner. Long hours of sitting in a recliner leads to swelling of the legs and/or potential wounds on your backside. Additional Orders / Instructions o Follow Nutritious Diet and  Increase Protein Intake Wound Treatment Wound #1 - Lower Leg Wound Laterality: Left, Medial Cleanser: Soap and Water (Home Health) 3 x Per Week/30 Days Discharge Instructions: Gently cleanse wound with antibacterial soap, rinse and pat dry prior to dressing wounds Peri-Wound Care: Desitin Maximum Strength Ointment 4 (oz) (Home Health) 3 x Per Week/30 Days Discharge Instructions: Apply periwound Primary Dressing: Gauze ([place]) 3 x Per Week/30 Days Discharge Instructions: As directed: moistened with Dakins Solution Secondary Dressing: Xtrasorb Large 6x9 (in/in) (Home Health) 3 x Per Week/30 Days Discharge Instructions: Apply to wound as directed. Do not cut. Compression Wrap: Medichoice 4 layer Compression System, 35-40 mmHG (Home Health) 3 x Per Week/30 Days Discharge Instructions: Apply multi-layer wrap as directed. Wound #2 - Toe Great Wound Laterality: Right Cleanser: Wound Cleanser (Home Health) 3 x Per Week/30 Days Discharge Instructions: Wash your hands with soap and water. Remove old dressing, discard into plastic bag and place into trash. Cleanse the wound with Wound Cleanser prior to applying a clean dressing using gauze sponges, not tissues or cotton balls. Do not scrub or use excessive force. Pat dry using gauze sponges, not tissue or cotton balls. Primary Dressing: Silvercel Small 2x2 (in/in) (Home Health) 3 x Per Week/30 Days Discharge Instructions: Apply Silvercel Small 2x2 (in/in) as instructed Secondary Dressing: Gauze (Home Health) 3 x Per Week/30 Days Discharge Instructions: Cover with dry gauze Laura Santiago, Laura Santiago ([id number]) Secured With: 32M Medipore H Soft Cloth Surgical Tape, 2x2 (in/yd) ([place]) 3 x Per Week/30 Days Discharge Instructions: Secure dressing Wound #5 - Lower Leg Wound Laterality: Left, Lateral Topical: Santyl Collagenase Ointment, 30 (gm), tube 3 x Per Week/30 Days Discharge Instructions: in office only Primary Dressing: Hydrofera Blue Ready  Transfer Foam, 2.5x2.5 (in/in) 3 x Per Week/30 Days Discharge Instructions: Apply Hydrofera Blue Ready to wound bed as directed Compression Wrap: Medichoice 4 layer Compression System, 35-40 mmHG (Home Health) 3 x Per Week/30 Days Discharge Instructions: Apply multi-layer wrap as directed. Patient Medications Allergies: Celebrex Notifications Medication Indication Start End Dakin's Solution 11/02/2021 DOSE 1 - miscellaneous 0.125 % solution - use for wet to dry dressings Electronic Signature(s) Signed: 11/02/2021 12:55:38 PM By: Kalman Shan DO Previous Signature: 11/02/2021 12:47:52 PM Version By: Kalman Shan DO Entered By: Kalman Shan on 11/02/2021 12:55:37 Laura Santiago ([id number]) -------------------------------------------------------------------------------- Problem List Details Patient Name: Laura Santiago,  Laura M. Date of Service: 11/02/2021 10:45 AM Medical Record Number: [id number] Patient Account Number: [id number] Date of Birth/Sex: Nov 25, 1946 (75 y.o. F) Treating RN: Cornell Barman Primary Care Provider: SYSTEM, PCP Other Clinician: Referring Provider: Randa Evens Treating Provider/Extender: Yaakov Guthrie in Treatment: 17 Active Problems ICD-10 Encounter Code Description Active Date MDM Diagnosis [phone] Non-pressure chronic ulcer of other part of left lower leg with fat layer 11/02/2021 No Yes exposed I87.312 Chronic venous hypertension (idiopathic) with ulcer of left lower 11/02/2021 No Yes extremity S91.101D Unspecified open wound of right great toe without damage to nail, 08/31/2021 No Yes subsequent encounter I87.2 Venous insufficiency (chronic) (peripheral) 07/06/2021 No Yes Z79.01 Long term (current) use of anticoagulants 07/06/2021 No Yes I10 Essential (primary) hypertension 07/06/2021 No Yes 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 07/06/2021 No Yes Inactive Problems ICD-10 Code Description Active Date Inactive Date S81.802A  Unspecified open wound, left lower leg, initial encounter 07/06/2021 07/06/2021 S91.101A Unspecified open wound of right great toe without damage to nail, initial 08/24/2021 08/24/2021 encounter S91.104A Unspecified open wound of right lesser toe(s) without damage to nail, initial 08/24/2021 08/24/2021 encounter Resolved Problems ICD-10 ALITHIA, ZAVALETA ([id number]) Code Description Active Date Resolved Date S91.104D Unspecified open wound of right lesser toe(s) without damage to nail, 08/31/2021 08/31/2021 subsequent encounter Electronic Signature(s) Signed: 11/02/2021 12:47:52 PM By: Kalman Shan DO Entered By: Kalman Shan on 11/02/2021 12:22:09 Laura Santiago ([id number]) -------------------------------------------------------------------------------- Progress Note Details Patient Name: Laura Santiago. Date of Service: 11/02/2021 10:45 AM Medical Record Number: [id number] Patient Account Number: [id number] Date of Birth/Sex: 20-Jan-1946 (75 y.o. F) Treating RN: Cornell Barman Primary Care Provider: SYSTEM, PCP Other Clinician: Referring Provider: Randa Evens Treating Provider/Extender: Yaakov Guthrie in Treatment: 17 Subjective Chief Complaint Information obtained from Patient Left lower extremity wound Right toe wounds Left upper lateral thigh wounds History of Present Illness (HPI) Admission 7/27 Ms. Marinell Igarashi is a 75 year old female with a past medical history of ADHD, metastatic breast cancer, stage IV chronic kidney disease, history of DVT on Xarelto and chronic venous insufficiency that presents to the clinic for a chronic left lower extremity wound. She recently moved to [facility] 4 days ago. She was being followed by wound care center in [name]. She reports a 10-year history of wounds to her left lower extremity that eventually do heal with debridement and compression therapy. She states that the current wound reopened 4 months ago and she  is using Vaseline and Coban. She denies signs of infection. 8/3; patient presents for 1 week follow-up. She reports no issues or complaints today. She states she had vascular studies done in the last week. She denies signs of infection. She brought her little service dog with her today. 8/17; patient presents for follow-up. She has missed her last clinic appointment. She states she took the wrap off and attempted to rewrap her leg. She is having difficulty with transportation. She has her service dog with her today. Overall she feels well and reports improvement in wound healing. She denies signs of infection. She reports owning an old Velcro wrap compression and has this at her living facility 9/14; patient presents for follow-up. Patient states that over the past 2 to 3 weeks she developed toe wounds to her right foot. She attributes this to tight fitting shoes. She subsequently developed cellulitis in the right leg and has been treated by doxycycline by her oncologist. She reports improvement in symptoms however continues to have some redness and swelling to this leg. To  the left lower extremity patient has been having her wraps changed with home health twice weekly. She states that the [facility] is not helping control the drainage. Other than that she has no issues or complaints today. She denies signs of infection to the left lower extremity. 9/21; patient presents for follow-up. She reports seeing infectious disease for her cellulitis. She reports no further management. She has home health that changes the wraps twice weekly. She has no issues or complaints today. She denies signs of infection. 10/5; patient presents for follow-up. She has no issues or complaints today. She denies signs of infection. She states that the right great toe has not been dressed by home health. 10/12; patient presents for follow-up. She has no issues or complaints today. She reports improvement in her wound  healing. She has been using silver alginate to the right great toe wound. She denies signs of infection. 10/26; patient presents for follow-up. Home health did not have sorbact so they continued to use Hydrofera Blue under the wrap. She has been using silver alginate to the great toe wound however she did not have a dressing in place today. She currently denies signs of infection. 11/2; patient presents for follow-up. She has been using sorb act under the compression wrap. She reports using silver alginate to the toe wound again she does not have a dressing in place. She currently denies signs of infection. 11/23; patient presents for follow-up. Unfortunately she has missed her last 2 clinic appointments. She was last seen 3 weeks ago. She did her own compression wrap with Kerlix and Coban yesterday after seeing vein and vascular. She has not been dressing her right great toe wound. She currently denies signs of infection. Patient History Information obtained from Patient. Social History Never smoker. Medical History Eyes Denies history of Cataracts, Glaucoma, Optic Neuritis Ear/Nose/Mouth/Throat Denies history of Chronic sinus problems/congestion, Middle ear problems Hematologic/Lymphatic Denies history of Anemia, Hemophilia, Human Immunodeficiency Virus, Lymphedema, Sickle Cell Disease Respiratory Denies history of Aspiration, Asthma, Chronic Obstructive Pulmonary Disease (COPD), Pneumothorax, Sleep Apnea, Tuberculosis Cardiovascular Patient has history of Hypertension Denies history of Angina, Arrhythmia, Congestive Heart Failure, Coronary Artery Disease, Deep Vein Thrombosis, Hypotension, Myocardial Infarction, Peripheral Arterial Disease, Peripheral Venous Disease, Phlebitis, Vasculitis Gastrointestinal IOLA, TURRI ([id number]) Denies history of Cirrhosis , Colitis, Crohn s, Hepatitis A, Hepatitis B, Hepatitis C Endocrine Denies history of Type I Diabetes, Type II  Diabetes Genitourinary Denies history of End Stage Renal Disease Immunological Denies history of Lupus Erythematosus, Raynaud s, Scleroderma Integumentary (Skin) Denies history of History of Burn, History of pressure wounds Musculoskeletal Patient has history of Osteoarthritis Denies history of Gout, Rheumatoid Arthritis, Osteomyelitis Oncologic Patient has history of Received Chemotherapy, Received Radiation Medical And Surgical History Notes Oncologic breast cancer Objective Constitutional Vitals Time Taken: 11:24 AM, Height: 66 in, Weight: 153 lbs, BMI: 24.7, Temperature: 98.6 F, Pulse: 88 bpm, Respiratory Rate: 16 breaths/min, Blood Pressure: 122/75 mmHg. General Notes: Left lower extremity: Large open wound to the medial aspect with nonviable tissue and granulation tissue present. Right foot: To the right great toe there is an open wound with circumferential callus and granulation tissue present postdebridement. To the left lateral thigh there are 2 wounds with a layer of dried fluid. No obvious signs of soft tissue infection. Integumentary (Hair, Skin) Wound #1 status is Open. Original cause of wound was Gradually Appeared. The date acquired was: 04/06/2021. The wound has been in treatment 17 weeks. The wound is located on the Left,Medial Lower  Leg. The wound measures 9.5cm length x 10cm width x 0.5cm depth; 74.613cm^2 area and 37.306cm^3 volume. There is a large amount of serosanguineous drainage noted. Wound #2 status is Open. Original cause of wound was Gradually Appeared. The date acquired was: 08/24/2021. The wound has been in treatment 10 weeks. The wound is located on the Right Toe Great. The wound measures 0.2cm length x 0.2cm width x 0.5cm depth; 0.031cm^2 area and 0.016cm^3 volume. There is Fat Layer (Subcutaneous Tissue) exposed. There is undermining starting at 12:00 and ending at 12:00 with a maximum distance of 0.5cm. There is a medium amount of serous drainage noted.  The wound margin is thickened. There is large (67-100%) red granulation within the wound bed. There is a small (1-33%) amount of necrotic tissue within the wound bed including Adherent Slough. Wound #5 status is Open. Original cause of wound was Trauma. The date acquired was: 10/10/2021. The wound is located on the Left,Lateral Lower Leg. The wound measures 3cm length x 1cm width x 0.1cm depth; 2.356cm^2 area and 0.236cm^3 volume. There is Fat Layer (Subcutaneous Tissue) exposed. There is a small amount of serous drainage noted. There is large (67-100%) pink granulation within the wound bed. There is a small (1-33%) amount of necrotic tissue within the wound bed including Adherent Slough. Assessment Active Problems ICD-10 Non-pressure chronic ulcer of other part of left lower leg with fat layer exposed Chronic venous hypertension (idiopathic) with ulcer of left lower extremity Unspecified open wound of right great toe without damage to nail, subsequent encounter Venous insufficiency (chronic) (peripheral) Long term (current) use of anticoagulants Essential (primary) hypertension Secondary malignant neoplasm of breast Laura Santiago, SANDIFORD. ([id number]) Unfortunately patient has missed her last 2 wound care appointments. Her wound is much larger today. She has been using sorbact and we will switch to Dakin's wet-to-dry dressings to assure that we have a good clean surface. I also debrided nonviable tissue. There are no obvious signs of infection. Patient has not responded well to Hydrofera Blue silver alginate or sorbact under compression over the past several months. She has metastatic breast cancer with lymph nodes and bone mets and is on chemotherapy. I am concerned that this is a major deterrent to her wound healing. She states she will discuss this with her oncologist. She is also not dressing the wound to her right toe. I recommended she use silver alginate. I debrided nonviable tissue. We  gave her surgical shoe to help with offloading the great toe. Again no obvious signs of infection on exam here. She also reports wounds to her left upper lateral thigh that have been present for many many years. It has a nonviable surface and I recommended using Hydrofera Blue to this area under the compression wrap. We will add Santyl only in office. We had a long discussion today About the importance of weekly follow-up. She expressed understanding. 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 There was a Excisional Skin/Subcutaneous Tissue Debridement with a total area of 95 sq cm performed by Kalman Shan, MD. With the following instrument(s): Curette to remove Viable and Non-Viable tissue/material. Material removed includes Subcutaneous Tissue, Slough, and Biofilm. No specimens were taken. A time out was conducted at 11:50, prior to the start of the procedure. A Moderate amount of bleeding was controlled with Pressure. The procedure was tolerated well. Post Debridement Measurements: 9.5cm length x 10cm width  x 0.5cm depth; 37.306cm^3 volume. Character of Wound/Ulcer Post Debridement is stable. Severity of Tissue Post Debridement is: Fat layer exposed. 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 There was a Four Layer Compression Therapy Procedure by Cornell Barman, RN. Post procedure Diagnosis Wound #1: Same as Pre-Procedure Wound #5 Pre-procedure diagnosis of Wound #5 is a Trauma, Other located on the Left,Lateral Lower Leg . There was a Four Layer Compression Therapy Procedure by Cornell Barman, RN. Post procedure Diagnosis Wound #5: Same as Pre-Procedure Plan Follow-up Appointments: Return Appointment in 1 week. Nurse Visit as needed Home Health: [place]: - [place] for wound care. May utilize formulary equivalent dressing for wound treatment orders unless otherwise specified. Home Health Nurse may visit PRN to address patient s wound care needs. Scheduled days for dressing changes to be completed; exception, patient has scheduled wound care visit that day. - Twice a week, patient comes to office on Wednesday **Please direct any NON-WOUND related issues/requests for orders to patient's Primary Care Physician. **If current dressing causes regression in wound condition, may D/C ordered dressing product/s and apply Normal Saline Moist Dressing daily until next [place] or Other MD appointment. **Notify [facility] of regression in wound condition at [phone]. Bathing/ Shower/ Hygiene: May shower with wound dressing protected with water repellent cover or cast protector. No tub bath. Edema Control - Lymphedema / Segmental Compressive Device / Other: Elevate leg(s) parallel to the floor when sitting. DO YOUR BEST to sleep in the bed at night. DO NOT sleep in your recliner. Long hours of sitting in a recliner leads to swelling of the legs and/or potential wounds on your backside. Additional Orders / Instructions: Follow Nutritious Diet and Increase Protein Intake The following medication(s) was prescribed: Dakin's Solution miscellaneous 0.125 % solution 1 use for wet to dry dressings starting 11/02/2021 WOUND #1: - Lower Leg Wound Laterality: Left, Medial Cleanser: Soap and Water ([place]) 3 x Per Week/30 Days Discharge Instructions: Gently cleanse wound with antibacterial soap, rinse and pat dry prior to dressing wounds Peri-Wound Care: Desitin Maximum Strength Ointment 4 (oz) (Home Health) 3 x Per Week/30 Days Discharge Instructions: Apply periwound Primary Dressing: Gauze ([place]) 3 x Per Week/30 Days Discharge Instructions: As directed: moistened with Dakins  Solution Secondary Dressing: Xtrasorb Large 6x9 (in/in) (Home Health) 3 x Per Week/30 Days Discharge Instructions: Apply to wound as directed. Do not cut. Compression Wrap: Medichoice 4 layer Compression System, 35-40 mmHG [facility]) 3 x Per Week/30 Days Laura Santiago, Laura Santiago ([id number]) Discharge Instructions: Apply multi-layer wrap as directed. WOUND #2: - Toe Great Wound Laterality: Right Cleanser: Wound Cleanser (Home Health) 3 x Per Week/30 Days Discharge Instructions: Wash your hands with soap and water. Remove old dressing, discard into plastic bag and place into trash. Cleanse the wound with Wound Cleanser prior to applying a clean dressing using gauze sponges, not tissues or cotton balls. Do not scrub or use excessive force. Pat dry using gauze sponges, not tissue or cotton balls. Primary Dressing: Silvercel Small 2x2 (in/in) (Home Health) 3 x Per Week/30 Days Discharge Instructions: Apply Silvercel Small 2x2 (in/in) as instructed Secondary Dressing: Gauze ([place]) 3 x Per Week/30 Days Discharge Instructions: Cover with dry gauze Secured With: 63M Medipore H Soft Cloth Surgical Tape, 2x2 (in/yd) ([place]) 3 x Per Week/30 Days Discharge Instructions: Secure dressing WOUND #5: -  Lower Leg Wound Laterality: Left, Lateral Topical: Santyl Collagenase Ointment, 30 (gm), tube 3 x Per Week/30 Days Discharge Instructions: in office only Primary Dressing: Hydrofera Blue Ready Transfer Foam, 2.5x2.5 (in/in) 3 x Per Week/30 Days Discharge Instructions: Apply Hydrofera Blue Ready to wound bed as directed Compression Wrap: Medichoice 4 layer Compression System, 35-40 mmHG (Home Health) 3 x Per Week/30 Days Discharge Instructions: Apply multi-layer wrap as directed. 1. In office sharp debridement 2. Dakin's wet-to-dry dressing under compression therapy 3. Silver alginate to the right toe wound 4. Surgical shoe to the right foot 5. Follow-up in 1 week Electronic Signature(s) Signed:  11/02/2021 12:56:52 PM By: Kalman Shan DO Previous Signature: 11/02/2021 12:47:52 PM Version By: Kalman Shan DO Entered By: Kalman Shan on 11/02/2021 12:56:32 Laura Santiago ([id number]) -------------------------------------------------------------------------------- ROS/PFSH Details Patient Name: Laura Santiago. Date of Service: 11/02/2021 10:45 AM Medical Record Number: [id number] Patient Account Number: [id number] Date of Birth/Sex: 07/08/1946 (75 y.o. F) Treating RN: Cornell Barman Primary Care Provider: SYSTEM, PCP Other Clinician: Referring Provider: Randa Evens Treating Provider/Extender: Yaakov Guthrie in Treatment: 17 Information Obtained From Patient Eyes Medical History: Negative for: Cataracts; Glaucoma; Optic Neuritis Ear/Nose/Mouth/Throat Medical History: Negative for: Chronic sinus problems/congestion; Middle ear problems Hematologic/Lymphatic Medical History: Negative for: Anemia; Hemophilia; Human Immunodeficiency Virus; Lymphedema; Sickle Cell Disease Respiratory Medical History: Negative for: Aspiration; Asthma; Chronic Obstructive Pulmonary Disease (COPD); Pneumothorax; Sleep Apnea; Tuberculosis Cardiovascular Medical History: Positive for: Hypertension Negative for: Angina; Arrhythmia; Congestive Heart Failure; Coronary Artery Disease; Deep Vein Thrombosis; Hypotension; Myocardial Infarction; Peripheral Arterial Disease; Peripheral Venous Disease; Phlebitis; Vasculitis Gastrointestinal Medical History: Negative for: Cirrhosis ; Colitis; Crohnos; Hepatitis A; Hepatitis B; Hepatitis C Endocrine Medical History: Negative for: Type I Diabetes; Type II Diabetes Genitourinary Medical History: Negative for: End Stage Renal Disease Immunological Medical History: Negative for: Lupus Erythematosus; Raynaudos; Scleroderma Integumentary (Skin) Medical History: Negative for: History of Burn; History of pressure  wounds Musculoskeletal Medical History: Positive for: Osteoarthritis Laura Santiago, Laura Santiago ([id number]) Negative for: Gout; Rheumatoid Arthritis; Osteomyelitis Oncologic Medical History: Positive for: Received Chemotherapy; Received Radiation Past Medical History Notes: breast cancer Immunizations Pneumococcal Vaccine: Received Pneumococcal Vaccination: No Implantable Devices None Family and Social History 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 Date of Service: 11/02/2021 Medical Record Number: [id number] Patient Account Number: [id number] Date of Birth/Sex: [birth year] (75 y.o. 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 S91.101D Unspecified open wound of right great toe without damage to nail, subsequent encounter I87.2 Venous insufficiency (chronic) (peripheral) Z79.01 Long term (current) use of anticoagulants I10 Essential (primary) hypertension 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 Facility Procedures CPT4 Code: [id number] Description: 40347 - DEB SUBQ TISSUE 20 SQ CM/< Modifier: Quantity: 1 CPT4 Code: Description: ICD-10 Diagnosis Description L97.822 Non-pressure chronic ulcer of other part of left lower leg with fat layer Modifier: exposed Quantity: CPT4 Code:  [id number] Description: 75643 - DEB SUBQ TISS EA ADDL 20CM Modifier: Quantity: 4 CPT4 Code: Description: ICD-10 Diagnosis Description S91.101D Unspecified open wound of right great toe without damage to  nail, subseque Modifier: nt encounter Quantity: Physician Procedures CPT4 Code: 0097949 Description: 97182 - WC PHYS SUBQ TISS 20 SQ CM Modifier: Quantity: 1 CPT4 Code: Description: ICD-10 Diagnosis Description L97.822 Non-pressure chronic ulcer of other part of left lower leg with fat layer e Modifier: xposed Quantity: CPT4 Code: 0990689 Description: 34068 - WC PHYS SUBQ TISS EA ADDL 20 CM Modifier: Quantity: 4 CPT4 Code: Description: ICD-10 Diagnosis Description S91.101D Unspecified open wound of right great toe without damage to nail, subsequen Modifier: t encounter Quantity: Electronic Signature(s) Signed: 11/02/2021 12:47:52 PM By: Kalman Shan DO Entered By: Kalman Shan on 11/02/2021 12:47:30

## 2021-11-07 ENCOUNTER — Other Ambulatory Visit: Payer: Self-pay

## 2021-11-07 ENCOUNTER — Ambulatory Visit
Admission: RE | Admit: 2021-11-07 | Discharge: 2021-11-07 | Disposition: A | Discharge status: Home / Self Care | Payer: Medicare Other | Source: Ambulatory Visit | Attending: Oncology | Admitting: Oncology

## 2021-11-07 ENCOUNTER — Other Ambulatory Visit: Payer: Self-pay | Admitting: Oncology

## 2021-11-07 DIAGNOSIS — C50919 Malignant neoplasm of unspecified site of unspecified female breast: Secondary | ICD-10-CM | POA: Insufficient documentation

## 2021-11-07 DIAGNOSIS — Z79899 Other long term (current) drug therapy: Secondary | ICD-10-CM

## 2021-11-07 DIAGNOSIS — I10 Essential (primary) hypertension: Secondary | ICD-10-CM | POA: Insufficient documentation

## 2021-11-07 DIAGNOSIS — Z0189 Encounter for other specified special examinations: Secondary | ICD-10-CM

## 2021-11-07 DIAGNOSIS — Z5181 Encounter for therapeutic drug level monitoring: Secondary | ICD-10-CM

## 2021-11-07 DIAGNOSIS — C7951 Secondary malignant neoplasm of bone: Secondary | ICD-10-CM

## 2021-11-07 LAB — ECHOCARDIOGRAM COMPLETE
AR max vel: 2.98 cm2
AV Area VTI: 3.23 cm2
AV Area mean vel: 3.16 cm2
AV Mean grad: 4 mmHg
AV Peak grad: 7.2 mmHg
Ao pk vel: 1.34 m/s
Area-P 1/2: 3.36 cm2
MV VTI: 3.34 cm2
S' Lateral: 3.1 cm

## 2021-11-07 NOTE — Progress Notes (Signed)
*  PRELIMINARY RESULTS* Echocardiogram 2D Echocardiogram has been performed.  Laura Santiago 11/07/2021, 10:58 AM

## 2021-11-08 ENCOUNTER — Encounter
Admission: RE | Admit: 2021-11-08 | Discharge: 2021-11-08 | Disposition: A | Discharge status: Home / Self Care | Payer: Medicare Other | Source: Ambulatory Visit | Attending: Oncology | Admitting: Oncology

## 2021-11-08 ENCOUNTER — Ambulatory Visit
Admission: RE | Admit: 2021-11-08 | Discharge: 2021-11-08 | Disposition: A | Discharge status: Home / Self Care | Payer: Medicare Other | Source: Ambulatory Visit | Attending: Oncology | Admitting: Oncology

## 2021-11-08 DIAGNOSIS — C7951 Secondary malignant neoplasm of bone: Secondary | ICD-10-CM | POA: Diagnosis present

## 2021-11-08 DIAGNOSIS — Z5112 Encounter for antineoplastic immunotherapy: Secondary | ICD-10-CM | POA: Insufficient documentation

## 2021-11-08 DIAGNOSIS — C50919 Malignant neoplasm of unspecified site of unspecified female breast: Secondary | ICD-10-CM

## 2021-11-08 DIAGNOSIS — C50312 Malignant neoplasm of lower-inner quadrant of left female breast: Secondary | ICD-10-CM | POA: Insufficient documentation

## 2021-11-08 MED ORDER — TECHNETIUM TC 99M MEDRONATE IV KIT
20.0000 | PACK | Freq: Once | INTRAVENOUS | Status: AC | PRN
  Administered 2021-11-08: 22.7 via INTRAVENOUS

## 2021-11-09 ENCOUNTER — Other Ambulatory Visit: Payer: Self-pay

## 2021-11-09 ENCOUNTER — Encounter: Payer: Self-pay | Admitting: Oncology

## 2021-11-09 ENCOUNTER — Encounter (HOSPITAL_BASED_OUTPATIENT_CLINIC_OR_DEPARTMENT_OTHER): Payer: Medicare Other | Admitting: Internal Medicine

## 2021-11-09 DIAGNOSIS — L97822 Non-pressure chronic ulcer of other part of left lower leg with fat layer exposed: Secondary | ICD-10-CM | POA: Diagnosis not present

## 2021-11-09 DIAGNOSIS — L03115 Cellulitis of right lower limb: Secondary | ICD-10-CM | POA: Diagnosis not present

## 2021-11-09 DIAGNOSIS — S81802A Unspecified open wound, left lower leg, initial encounter: Secondary | ICD-10-CM | POA: Diagnosis not present

## 2021-11-09 DIAGNOSIS — I87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: Secondary | ICD-10-CM | POA: Diagnosis not present

## 2021-11-09 DIAGNOSIS — S91101D Unspecified open wound of right great toe without damage to nail, subsequent encounter: Secondary | ICD-10-CM

## 2021-11-09 NOTE — Telephone Encounter (Signed)
Signing previous note, see note on 9/6

## 2021-11-09 NOTE — Progress Notes (Addendum)
MARGURETTE, BRENER ([id number]) Visit Report for 11/09/2021 Chief Complaint Document Details Patient Name: Laura Santiago, Laura Santiago. Date of Service: 11/09/2021 8:30 AM Medical Record Number: [id number] Patient Account Number: [id number] Date of Birth/Sex: August 07, 1946 (75 y.o. F) Treating RN: Donnamarie Poag Primary Care Provider: Priscille Kluver Other Clinician: Referring Provider: Randa Evens Treating Provider/Extender: Yaakov Guthrie in Treatment: 18 Information Obtained from: Patient Chief Complaint Left lower extremity wound Right toe wounds Left upper lateral thigh wounds Electronic Signature(s) Signed: 11/09/2021 9:43:34 AM By: Kalman Shan DO Entered By: Kalman Shan on 11/09/2021 09:30:34 Laura Santiago ([id number]) -------------------------------------------------------------------------------- HPI Details Patient Name: Laura Santiago. Date of Service: 11/09/2021 8:30 AM Medical Record Number: [id number] Patient Account Number: [id number] Date of Birth/Sex: 1946/10/29 (75 y.o. F) Treating RN: Donnamarie Poag Primary Care Provider: Priscille Kluver Other Clinician: Referring Provider: Randa Evens Treating Provider/Extender: Yaakov Guthrie in Treatment: 18 History of Present Illness HPI Description: Admission 7/27 Ms. Annjanette Wertenberger is a 75 year old female with a past medical history of ADHD, metastatic breast cancer, stage IV chronic kidney disease, history of DVT on Xarelto and chronic venous insufficiency that presents to the clinic for a chronic left lower extremity wound. She recently moved to [facility] 4 days ago. She was being followed by wound care center in [name]. She reports a 10-year history of wounds to her left lower extremity that eventually do heal with debridement and compression therapy. She states that the current wound reopened 4 months ago and she is using Vaseline and Coban. She denies signs of infection. 8/3;  patient presents for 1 week follow-up. She reports no issues or complaints today. She states she had vascular studies done in the last week. She denies signs of infection. She brought her little service dog with her today. 8/17; patient presents for follow-up. She has missed her last clinic appointment. She states she took the wrap off and attempted to rewrap her leg. She is having difficulty with transportation. She has her service dog with her today. Overall she feels well and reports improvement in wound healing. She denies signs of infection. She reports owning an old Velcro wrap compression and has this at her living facility 9/14; patient presents for follow-up. Patient states that over the past 2 to 3 weeks she developed toe wounds to her right foot. She attributes this to tight fitting shoes. She subsequently developed cellulitis in the right leg and has been treated by doxycycline by her oncologist. She reports improvement in symptoms however continues to have some redness and swelling to this leg. To the left lower extremity patient has been having her wraps changed with home health twice weekly. She states that the [facility] is not helping control the drainage. Other than that she has no issues or complaints today. She denies signs of infection to the left lower extremity. 9/21; patient presents for follow-up. She reports seeing infectious disease for her cellulitis. She reports no further management. She has home health that changes the wraps twice weekly. She has no issues or complaints today. She denies signs of infection. 10/5; patient presents for follow-up. She has no issues or complaints today. She denies signs of infection. She states that the right great toe has not been dressed by home health. 10/12; patient presents for follow-up. She has no issues or complaints today. She reports improvement in her wound healing. She has been using silver alginate to the right great toe  wound. She denies signs of infection. 10/26; patient presents for follow-up.  Home health did not have sorbact so they continued to use Hydrofera Blue under the wrap. She has been using silver alginate to the great toe wound however she did not have a dressing in place today. She currently denies signs of infection. 11/2; patient presents for follow-up. She has been using sorb act under the compression wrap. She reports using silver alginate to the toe wound again she does not have a dressing in place. She currently denies signs of infection. 11/23; patient presents for follow-up. Unfortunately she has missed her last 2 clinic appointments. She was last seen 3 weeks ago. She did her own compression wrap with Kerlix and Coban yesterday after seeing vein and vascular. She has not been dressing her right great toe wound. She currently denies signs of infection. 11/30; patient presents for 1 week follow-up. She states she changed her dressing last week prior to home health and use sorb act with Dakin's and Hydrofera Blue. Home health has changed the dressing as well and they have been using sorbact. Today she reports increased redness to her right lower extremity. She has a history of cellulitis to this leg. She has been using silver alginate to the right great toe. Unfortunately she had an episode of diarrhea prior to coming in and had feces all over the right leg and to the wrap of her left leg. Electronic Signature(s) Signed: 11/09/2021 9:43:34 AM By: Kalman Shan DO Entered By: Kalman Shan on 11/09/2021 09:33:22 Laura Santiago ([id number]) -------------------------------------------------------------------------------- Physical Exam Details Patient Name: SHAMERE, DILWORTH. Date of Service: 11/09/2021 8:30 AM Medical Record Number: [id number] Patient Account Number: [id number] Date of Birth/Sex: 19-Jan-1946 (75 y.o. F) Treating RN: Donnamarie Poag Primary Care Provider: Priscille Kluver  Other Clinician: Referring Provider: Randa Evens Treating Provider/Extender: Yaakov Guthrie in Treatment: 18 Constitutional . Cardiovascular . Psychiatric . Notes Left lower extremity: Large open wound to the medial aspect with nonviable tissue and granulation tissue present. Right foot: To the right great toe there is an open wound with Granulation tissue and circumferential undermining.. To the left lateral thigh there Is 1 open wound with granulation tissue present. The right lower extremity has increased erythema and warmth Electronic Signature(s) Signed: 11/09/2021 9:43:34 AM By: Kalman Shan DO Entered By: Kalman Shan on 11/09/2021 09:35:10 Laura Santiago ([id number]) -------------------------------------------------------------------------------- Physician Orders Details Patient Name: Laura Santiago. Date of Service: 11/09/2021 8:30 AM Medical Record Number: [id number] Patient Account Number: [id number] Date of Birth/Sex: Aug 15, 1946 (75 y.o. F) Treating RN: Donnamarie Poag Primary Care Provider: Priscille Kluver Other Clinician: Referring Provider: Randa Evens Treating Provider/Extender: Yaakov Guthrie in Treatment: 59 Verbal / Phone Orders: No Diagnosis Coding Follow-up Appointments o Return Appointment in 1 week. o Nurse Visit as needed [place] for wound care. May utilize formulary equivalent dressing for wound treatment orders unless otherwise specified. Home Health Nurse may visit PRN to address patientos wound care needs. o Scheduled days for dressing changes to be completed; exception, patient has scheduled wound care visit that day. - Twice a week, patient comes to office on Wednesday o **Please direct any NON-WOUND related issues/requests for orders to patient's Primary Care Physician. **If current dressing causes regression in wound condition, may D/C  ordered dressing product/s and apply Normal Saline Moist Dressing daily until next [place] or Other MD appointment. **Notify Wound Healing Center of regression in wound condition at [phone]. Bathing/ Shower/ Hygiene o May shower with wound dressing protected  with water repellent cover or cast protector. o No tub bath. Edema Control - Lymphedema / Segmental Compressive Device / Other o Elevate leg(s) parallel to the floor when sitting. o DO YOUR BEST to sleep in the bed at night. DO NOT sleep in your recliner. Long hours of sitting in a recliner leads to swelling of the legs and/or potential wounds on your backside. Additional Orders / Instructions o Follow Nutritious Diet and Increase Protein Intake Medications-Please add to medication list. o P.O. Antibiotics - Pick up and take oral antibiotics for your right leg Wound Treatment Wound #1 - Lower Leg Wound Laterality: Left, Medial Cleanser: Soap and Water (Home Health) 3 x Per Week/30 Days Discharge Instructions: Gently cleanse wound with antibacterial soap, rinse and pat dry prior to dressing wounds Peri-Wound Care: Desitin Maximum Strength Ointment 4 (oz) (Home Health) 3 x Per Week/30 Days Discharge Instructions: Apply periwound Primary Dressing: Cutimed Sorbact 1.5x 2.38 (in/in) 3 x Per Week/30 Days Discharge Instructions: DAKINS MOISTENED- Secondary Dressing: Xtrasorb Large 6x9 (in/in) (Home Health) 3 x Per Week/30 Days Discharge Instructions: Apply to wound as directed. Do not cut. Compression Wrap: Medichoice 4 layer Compression System, 35-40 mmHG (Home Health) 3 x Per Week/30 Days Discharge Instructions: Apply multi-layer wrap as directed. Wound #2 - Toe Great Wound Laterality: Right Cleanser: Wound Cleanser (Home Health) 3 x Per Week/30 Days Discharge Instructions: Wash your hands with soap and water. Remove old dressing, discard into plastic bag and place into trash. Cleanse the wound with Wound  Cleanser prior to applying a clean dressing using gauze sponges, not tissues or cotton balls. Do not scrub or use excessive force. Pat dry using gauze sponges, not tissue or cotton balls. Primary Dressing: Silvercel Small 2x2 (in/in) (Home Health) 3 x Per Week/30 Days Discharge Instructions: Apply Silvercel Small 2x2 (in/in) as instructed KADAJAH, KJOS ([id number]) Secondary Dressing: Gauze ([place]) 3 x Per Week/30 Days Discharge Instructions: Cover with dry gauze Secured With: 56M Medipore H Soft Cloth Surgical Tape, 2x2 (in/yd) ([place]) 3 x Per Week/30 Days Discharge Instructions: Secure dressing Wound #5 - Lower Leg Wound Laterality: Left, Lateral Cleanser: Normal Saline 3 x Per Week/30 Days Discharge Instructions: Wash your hands with soap and water. Remove old dressing, discard into plastic bag and place into trash. Cleanse the wound with Normal Saline prior to applying a clean dressing using gauze sponges, not tissues or cotton balls. Do not scrub or use excessive force. Pat dry using gauze sponges, not tissue or cotton balls. Cleanser: Soap and Water 3 x Per Week/30 Days Discharge Instructions: Gently cleanse wound with antibacterial soap, rinse and pat dry prior to dressing wounds Topical: Santyl Collagenase Ointment, 30 (gm), tube 3 x Per Week/30 Days Discharge Instructions: in office only Primary Dressing: Hydrofera Blue Ready Transfer Foam, 2.5x2.5 (in/in) 3 x Per Week/30 Days Discharge Instructions: Apply Hydrofera Blue Ready to wound bed as directed Compression Wrap: Medichoice 4 layer Compression System, 35-40 mmHG (Home Health) 3 x Per Week/30 Days Discharge Instructions: Apply multi-layer wrap as directed. Patient Medications Allergies: Celebrex Notifications Medication Indication Start End Keflex 11/09/2021 DOSE 1 - oral 500 mg capsule - 1 capsule oral Q6H x 10 days Electronic Signature(s) Signed: 11/09/2021 9:19:16 AM By: Kalman Shan DO Entered By:  Kalman Shan on 11/09/2021 09:19:15 Laura Santiago ([id number]) -------------------------------------------------------------------------------- Problem List Details Patient Name: Laura Santiago. Date of Service: 11/09/2021 8:30 AM Medical Record Number: [id number] Patient Account Number: [id number] Date of Birth/Sex: 01-03-1946 (75 y.o. F) Treating RN:  Donnamarie Poag Primary Care Provider: Orvis Brill, DOCTORS Other Clinician: Referring Provider: Randa Evens Treating Provider/Extender: Yaakov Guthrie in Treatment: 18 Active Problems ICD-10 Encounter Code Description Active Date MDM Diagnosis L97.822 Non-pressure chronic ulcer of other part of left lower leg with fat layer 11/02/2021 No Yes exposed I87.312 Chronic venous hypertension (idiopathic) with ulcer of left lower 11/02/2021 No Yes extremity S91.101D Unspecified open wound of right great toe without damage to nail, 08/31/2021 No Yes subsequent encounter I87.2 Venous insufficiency (chronic) (peripheral) 07/06/2021 No Yes Z79.01 Long term (current) use of anticoagulants 07/06/2021 No Yes I10 Essential (primary) hypertension 07/06/2021 No Yes 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 07/06/2021 No Yes Inactive Problems ICD-10 Code Description Active Date Inactive Date S81.802A Unspecified open wound, left lower leg, initial encounter 07/06/2021 07/06/2021 S91.101A Unspecified open wound of right great toe without damage to nail, initial 08/24/2021 08/24/2021 encounter S91.104A Unspecified open wound of right lesser toe(s) without damage to nail, initial 08/24/2021 08/24/2021 encounter Resolved Problems ICD-10 ZIRA, HELINSKI ([id number]) Code Description Active Date Resolved Date S91.104D Unspecified open wound of right lesser toe(s) without damage to nail, 08/31/2021 08/31/2021 subsequent encounter Electronic Signature(s) Signed: 11/09/2021 9:43:34 AM By: Kalman Shan DO Entered By: Kalman Shan on  11/09/2021 09:30:01 Laura Santiago ([id number]) -------------------------------------------------------------------------------- Progress Note Details Patient Name: Laura Santiago. Date of Service: 11/09/2021 8:30 AM Medical Record Number: [id number] Patient Account Number: [id number] Date of Birth/Sex: [birth year] (75 y.o. F) Treating RN: Donnamarie Poag Primary Care Provider: Priscille Kluver Other Clinician: Referring Provider: Randa Evens Treating Provider/Extender: Yaakov Guthrie in Treatment: 18 Subjective Chief Complaint Information obtained from Patient Left lower extremity wound Right toe wounds Left upper lateral thigh wounds History of Present Illness (HPI) Admission 7/27 Ms. Stevee Valenta is a 75 year old female with a past medical history of ADHD, metastatic breast cancer, stage IV chronic kidney disease, history of DVT on Xarelto and chronic venous insufficiency that presents to the clinic for a chronic left lower extremity wound. She recently moved to [facility] 4 days ago. She was being followed by wound care center in [name]. She reports a 10-year history of wounds to her left lower extremity that eventually do heal with debridement and compression therapy. She states that the current wound reopened 4 months ago and she is using Vaseline and Coban. She denies signs of infection. 8/3; patient presents for 1 week follow-up. She reports no issues or complaints today. She states she had vascular studies done in the last week. She denies signs of infection. She brought her little service dog with her today. 8/17; patient presents for follow-up. She has missed her last clinic appointment. She states she took the wrap off and attempted to rewrap her leg. She is having difficulty with transportation. She has her service dog with her today. Overall she feels well and reports improvement in wound healing. She denies signs of infection. She reports owning an  old Velcro wrap compression and has this at her living facility 9/14; patient presents for follow-up. Patient states that over the past 2 to 3 weeks she developed toe wounds to her right foot. She attributes this to tight fitting shoes. She subsequently developed cellulitis in the right leg and has been treated by doxycycline by her oncologist. She reports improvement in symptoms however continues to have some redness and swelling to this leg. To the left lower extremity patient has been having her wraps changed with home health twice weekly. She states that the [facility] is not helping  control the drainage. Other than that she has no issues or complaints today. She denies signs of infection to the left lower extremity. 9/21; patient presents for follow-up. She reports seeing infectious disease for her cellulitis. She reports no further management. She has home health that changes the wraps twice weekly. She has no issues or complaints today. She denies signs of infection. 10/5; patient presents for follow-up. She has no issues or complaints today. She denies signs of infection. She states that the right great toe has not been dressed by home health. 10/12; patient presents for follow-up. She has no issues or complaints today. She reports improvement in her wound healing. She has been using silver alginate to the right great toe wound. She denies signs of infection. 10/26; patient presents for follow-up. Home health did not have sorbact so they continued to use Hydrofera Blue under the wrap. She has been using silver alginate to the great toe wound however she did not have a dressing in place today. She currently denies signs of infection. 11/2; patient presents for follow-up. She has been using sorb act under the compression wrap. She reports using silver alginate to the toe wound again she does not have a dressing in place. She currently denies signs of infection. 11/23; patient presents for  follow-up. Unfortunately she has missed her last 2 clinic appointments. She was last seen 3 weeks ago. She did her own compression wrap with Kerlix and Coban yesterday after seeing vein and vascular. She has not been dressing her right great toe wound. She currently denies signs of infection. 11/30; patient presents for 1 week follow-up. She states she changed her dressing last week prior to home health and use sorb act with Dakin's and Hydrofera Blue. Home health has changed the dressing as well and they have been using sorbact. Today she reports increased redness to her right lower extremity. She has a history of cellulitis to this leg. She has been using silver alginate to the right great toe. Unfortunately she had an episode of diarrhea prior to coming in and had feces all over the right leg and to the wrap of her left leg. Patient History Information obtained from Patient. Social History Never smoker. Medical History Eyes Denies history of Cataracts, Glaucoma, Optic Neuritis Ear/Nose/Mouth/Throat Denies history of Chronic sinus problems/congestion, Middle ear problems Hematologic/Lymphatic Denies history of Anemia, Hemophilia, Human Immunodeficiency Virus, Lymphedema, Sickle Cell Disease Respiratory Denies history of Aspiration, Asthma, Chronic Obstructive Pulmonary Disease (COPD), Pneumothorax, Sleep Apnea, Tuberculosis NADEA, KIRKLAND ([id number]) Cardiovascular Patient has history of Hypertension Denies history of Angina, Arrhythmia, Congestive Heart Failure, Coronary Artery Disease, Deep Vein Thrombosis, Hypotension, Myocardial Infarction, Peripheral Arterial Disease, Peripheral Venous Disease, Phlebitis, Vasculitis Gastrointestinal Denies history of Cirrhosis , Colitis, Crohn s, Hepatitis A, Hepatitis B, Hepatitis C Endocrine Denies history of Type I Diabetes, Type II Diabetes Genitourinary Denies history of End Stage Renal Disease Immunological Denies history of Lupus  Erythematosus, Raynaud s, Scleroderma Integumentary (Skin) Denies history of History of Burn, History of pressure wounds Musculoskeletal Patient has history of Osteoarthritis Denies history of Gout, Rheumatoid Arthritis, Osteomyelitis Oncologic Patient has history of Received Chemotherapy, Received Radiation Medical And Surgical History Notes Oncologic breast cancer Objective Constitutional Vitals Time Taken: 8:40 AM, Height: 66 in, Weight: 153 lbs, BMI: 24.7, Temperature: 98.1 F, Pulse: 94 bpm, Respiratory Rate: 16 breaths/min, Blood Pressure: 125/71 mmHg. General Notes: Left lower extremity: Large open wound to the medial aspect with nonviable tissue and granulation tissue present. Right foot: To the  right great toe there is an open wound with Granulation tissue and circumferential undermining.. To the left lateral thigh there Is 1 open wound with granulation tissue present. The right lower extremity has increased erythema and warmth Integumentary (Hair, Skin) Wound #1 status is Open. Original cause of wound was Gradually Appeared. The date acquired was: 04/06/2021. The wound has been in treatment 18 weeks. The wound is located on the Left,Medial Lower Leg. The wound measures 9cm length x 10cm width x 0.5cm depth; 70.686cm^2 area and 35.343cm^3 volume. There is Fat Layer (Subcutaneous Tissue) exposed. There is no tunneling or undermining noted. There is a large amount of serosanguineous drainage noted. There is large (67-100%) red, pink granulation within the wound bed. There is a small (1-33%) amount of necrotic tissue within the wound bed including Adherent Slough. Wound #2 status is Open. Original cause of wound was Gradually Appeared. The date acquired was: 08/24/2021. The wound has been in treatment 11 weeks. The wound is located on the Right Toe Great. The wound measures 0.2cm length x 0.2cm width x 0.1cm depth; 0.031cm^2 area and 0.003cm^3 volume. There is Fat Layer (Subcutaneous  Tissue) exposed. There is no tunneling or undermining noted. There is a medium amount of serous drainage noted. The wound margin is thickened. There is large (67-100%) red granulation within the wound bed. There is a small (1-33%) amount of necrotic tissue within the wound bed including Adherent Slough. Wound #5 status is Open. Original cause of wound was Trauma. The date acquired was: 10/10/2021. The wound has been in treatment 1 weeks. The wound is located on the Left,Lateral Lower Leg. The wound measures 0.4cm length x 0.4cm width x 0.1cm depth; 0.126cm^2 area and 0.013cm^3 volume. There is Fat Layer (Subcutaneous Tissue) exposed. There is no tunneling or undermining noted. There is a small amount of serous drainage noted. There is small (1-33%) pink granulation within the wound bed. There is a large (67-100%) amount of necrotic tissue within the wound bed including Eschar. Assessment Active Problems ICD-10 Non-pressure chronic ulcer of other part of left lower leg with fat layer exposed Chronic venous hypertension (idiopathic) with ulcer of left lower extremity Unspecified open wound of right great toe without damage to nail, subsequent encounter Venous insufficiency (chronic) (peripheral) Ayuso, Cornesha M. ([id number]) Long term (current) use of anticoagulants Essential (primary) hypertension Secondary malignant neoplasm of breast All patient's wounds have shown improvement in size and appearance since last clinic visit. I debrided nonviable tissue. She does have increased warmth and erythema to the right lower extremity and I will treat for cellulitis with Keflex. I recommended continuing Dakin's and can add sorb act to her left lower extremity under 4-layer compression. She can continue Hydrofera Blue to the left lateral side and silver alginate to the right great toe. We discussed how her progression of her Left lower extremity wound is likely from her chemotherapy and will be hard to  heal. She expressed understanding. Procedures Wound #1 Pre-procedure diagnosis of Wound #1 is a Venous Leg Ulcer located on the Left,Medial Lower Leg . There was a Three Layer Compression Therapy Procedure by Donnamarie Poag, RN. Post procedure Diagnosis Wound #1: Same as Pre-Procedure Plan Follow-up Appointments: Return Appointment in 1 week. Nurse Visit as needed Home Health: [place]: - [place] for wound care. May utilize formulary equivalent dressing for wound treatment orders unless otherwise specified. Home Health Nurse may visit PRN to address patient s wound care needs. Scheduled days for dressing changes to  be completed; exception, patient has scheduled wound care visit that day. - Twice a week, patient comes to office on Wednesday **Please direct any NON-WOUND related issues/requests for orders to patient's Primary Care Physician. **If current dressing causes regression in wound condition, may D/C ordered dressing product/s and apply Normal Saline Moist Dressing daily until next [place] or Other MD appointment. **[facility] of regression in wound condition at [phone]. Bathing/ Shower/ Hygiene: May shower with wound dressing protected with water repellent cover or cast protector. No tub bath. Edema Control - Lymphedema / Segmental Compressive Device / Other: Elevate leg(s) parallel to the floor when sitting. DO YOUR BEST to sleep in the bed at night. DO NOT sleep in your recliner. Long hours of sitting in a recliner leads to swelling of the legs and/or potential wounds on your backside. Additional Orders / Instructions: Follow Nutritious Diet and Increase Protein Intake Medications-Please add to medication list.: P.O. Antibiotics - Pick up and take oral antibiotics for your right leg The following medication(s) was prescribed: Keflex oral 500 mg capsule 1 1 capsule oral Q6H x 10 days starting 11/09/2021 WOUND #1:  - Lower Leg Wound Laterality: Left, Medial Cleanser: Soap and Water (Home Health) 3 x Per Week/30 Days Discharge Instructions: Gently cleanse wound with antibacterial soap, rinse and pat dry prior to dressing wounds Peri-Wound Care: Desitin Maximum Strength Ointment 4 (oz) (Home Health) 3 x Per Week/30 Days Discharge Instructions: Apply periwound Primary Dressing: Cutimed Sorbact 1.5x 2.38 (in/in) 3 x Per Week/30 Days Discharge Instructions: DAKINS MOISTENED- Secondary Dressing: Xtrasorb Large 6x9 (in/in) (Home Health) 3 x Per Week/30 Days Discharge Instructions: Apply to wound as directed. Do not cut. Compression Wrap: Medichoice 4 layer Compression System, 35-40 mmHG (Home Health) 3 x Per Week/30 Days Discharge Instructions: Apply multi-layer wrap as directed. WOUND #2: - Toe Great Wound Laterality: Right Cleanser: Wound Cleanser (Home Health) 3 x Per Week/30 Days Discharge Instructions: Wash your hands with soap and water. Remove old dressing, discard into plastic bag and place into trash. Cleanse the wound with Wound Cleanser prior to applying a clean dressing using gauze sponges, not tissues or cotton balls. Do not scrub or use excessive force. Pat dry using gauze sponges, not tissue or cotton balls. Primary Dressing: Silvercel Small 2x2 (in/in) (Home Health) 3 x Per Week/30 Days Discharge Instructions: Apply Silvercel Small 2x2 (in/in) as instructed Secondary Dressing: Gauze ([place]) 3 x Per Week/30 Days Discharge Instructions: Cover with dry gauze Secured With: 47M Medipore H Soft Cloth Surgical Tape, 2x2 (in/yd) ([facility]) 3 x Per Week/30 Days Discharge Instructions: Secure dressing KOBI, ALLER ([id number]) WOUND #5: - Lower Leg Wound Laterality: Left, Lateral Cleanser: Normal Saline 3 x Per Week/30 Days Discharge Instructions: Wash your hands with soap and water. Remove old dressing, discard into plastic bag and place into trash. Cleanse the wound with Normal Saline  prior to applying a clean dressing using gauze sponges, not tissues or cotton balls. Do not scrub or use excessive force. Pat dry using gauze sponges, not tissue or cotton balls. Cleanser: Soap and Water 3 x Per Week/30 Days Discharge Instructions: Gently cleanse wound with antibacterial soap, rinse and pat dry prior to dressing wounds Topical: Santyl Collagenase Ointment, 30 (gm), tube 3 x Per Week/30 Days Discharge Instructions: in office only Primary Dressing: Hydrofera Blue Ready Transfer Foam, 2.5x2.5 (in/in) 3 x Per Week/30 Days Discharge Instructions: Apply Hydrofera Blue Ready to wound bed as directed Compression Wrap: Medichoice 4 layer Compression  System, 35-40 mmHG (Home Health) 3 x Per Week/30 Days Discharge Instructions: Apply multi-layer wrap as directed. 1. In office sharp debridement 2. Sorbact with Dakin's under 4-layer compression, Hydrofera Blue and silver alginate 3. Follow-up in 1 week 4. Keflex Electronic Signature(s) Signed: 11/09/2021 9:43:34 AM By: Kalman Shan DO Entered By: Kalman Shan on 11/09/2021 09:41:47 Laura Santiago ([id number]) -------------------------------------------------------------------------------- ROS/PFSH Details Patient Name: Laura Santiago. Date of Service: 11/09/2021 8:30 AM Medical Record Number: [id number] Patient Account Number: [id number] Date of Birth/Sex: 04-02-1946 (75 y.o. F) Treating RN: Donnamarie Poag Primary Care Provider: Priscille Kluver Other Clinician: Referring Provider: Randa Evens Treating Provider/Extender: Yaakov Guthrie in Treatment: 18 Information Obtained From Patient Eyes Medical History: Negative for: Cataracts; Glaucoma; Optic Neuritis Ear/Nose/Mouth/Throat Medical History: Negative for: Chronic sinus problems/congestion; Middle ear problems Hematologic/Lymphatic Medical History: Negative for: Anemia; Hemophilia; Human Immunodeficiency Virus; Lymphedema; Sickle Cell  Disease Respiratory Medical History: Negative for: Aspiration; Asthma; Chronic Obstructive Pulmonary Disease (COPD); Pneumothorax; Sleep Apnea; Tuberculosis Cardiovascular Medical History: Positive for: Hypertension Negative for: Angina; Arrhythmia; Congestive Heart Failure; Coronary Artery Disease; Deep Vein Thrombosis; Hypotension; Myocardial Infarction; Peripheral Arterial Disease; Peripheral Venous Disease; Phlebitis; Vasculitis Gastrointestinal Medical History: Negative for: Cirrhosis ; Colitis; Crohnos; Hepatitis A; Hepatitis B; Hepatitis C Endocrine Medical History: Negative for: Type I Diabetes; Type II Diabetes Genitourinary Medical History: Negative for: End Stage Renal Disease Immunological Medical History: Negative for: Lupus Erythematosus; Raynaudos; Scleroderma Integumentary (Skin) Medical History: Negative for: History of Burn; History of pressure wounds Musculoskeletal Medical History: Positive for: Osteoarthritis GENELLE, [place]CONOM[place] ([id number]) Negative for: Gout; Rheumatoid Arthritis; Osteomyelitis Oncologic Medical History: Positive for: Received Chemotherapy; Received Radiation Past Medical History Notes: breast cancer Immunizations Pneumococcal Vaccine: Received Pneumococcal Vaccination: No Implantable Devices None Family and Social History Never smoker Electronic Signature(s) Signed: 11/09/2021 9:43:34 AM By: Kalman Shan DO Signed: 11/09/2021 2:54:54 PM By: Donnamarie Poag Entered By: Kalman Shan on 11/09/2021 09:33:35 Laura Santiago ([id number]) -------------------------------------------------------------------------------- SuperBill Details Patient Name: Laura Santiago. Date of Service: 11/09/2021 Medical Record Number: [id number] Patient Account Number: [id number] Date of Birth/Sex: 01/17/1946 (75 y.o. 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 S91.101D Unspecified open wound of right great toe without damage to nail, subsequent encounter I87.2 Venous insufficiency (chronic) (peripheral) Z79.01 Long term (current) use of anticoagulants I10 Essential (primary) hypertension 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 Cellulitis of right lower limb Facility Procedures CPT4 Code: 95638756 Description: (Facility Use Only) 29581LT - [facility] LWR LT LEG Modifier: Quantity: 1 Physician Procedures CPT4 Code: 4332951 Description: 88416 - WC PHYS LEVEL 4 - EST PT Modifier: Quantity: 1 CPT4 Code: Description: ICD-10 Diagnosis Description L03.115 Cellulitis of right lower limb L97.822 Non-pressure chronic ulcer of other part of left lower leg with fat l I87.312 Chronic venous hypertension (idiopathic) with ulcer of left lower ext S91.101D  Unspecified open wound of right great toe without damage to nail, sub Modifier: ayer exposed remity sequent encounter Quantity: Electronic Signature(s) Signed: 11/09/2021 9:43:34 AM By: Kalman Shan DO Entered By: Kalman Shan on 11/09/2021 09:43:08

## 2021-11-09 NOTE — Progress Notes (Signed)
Laura Santiago, Laura Santiago ([id number]) Visit Report for 11/09/2021 Arrival Information Details Patient Name: Laura Santiago, Laura Santiago. Date of Service: 11/09/2021 8:30 AM Medical Record Number: [id number] Patient Account Number: [id number] Date of Birth/Sex: Sep 21, 1946 (75 y.o. F) Treating RN: Donnamarie Poag Primary Care Annaleese Guier: Priscille Kluver Other Clinician: Referring Trevone Prestwood: Randa Evens Treating Kaleena Corrow/Extender: Yaakov Guthrie in Treatment: 18 Visit Information History Since Last Visit Added or deleted any medications: No Patient Arrived: [name] Had a fall or experienced change in No Arrival Time: 08:32 activities of daily living that may affect Accompanied By: service dog risk of falls: Transfer Assistance: None Hospitalized since last visit: No Patient Identification Verified: Yes Has Dressing in Place as Prescribed: Yes Secondary Verification Process Completed: Yes Has Compression in Place as Prescribed: Yes Patient Has Alerts: Yes Pain Present Now: No Patient Alerts: PT HAS SERVICE ANIMAL ABI 07/11/21 R) 1.16 L) 1.27 Electronic Signature(s) Signed: 11/09/2021 2:54:54 PM By: Donnamarie Poag Entered By: Donnamarie Poag on 11/09/2021 08:38:13 Laura Santiago ([id number]) -------------------------------------------------------------------------------- Clinic Level of Care Assessment Details Patient Name: Laura Santiago. Date of Service: 11/09/2021 8:30 AM Medical Record Number: [id number] Patient Account Number: [id number] Date of Birth/Sex: 1946-07-01 (75 y.o. F) Treating RN: Donnamarie Poag Primary Care Danelle Curiale: Priscille Kluver Other Clinician: Referring Rayshaun Needle: Randa Evens Treating Anny Sayler/Extender: Yaakov Guthrie in Treatment: 18 Clinic Level of Care Assessment Items TOOL 1 Quantity Score []  - Use when EandM and Procedure is performed on INITIAL visit 0 ASSESSMENTS - Nursing Assessment / Reassessment []  - General Physical Exam (combine w/ comprehensive  assessment (listed just below) when performed on new 0 pt. evals) []  - 0 Comprehensive Assessment (HX, ROS, Risk Assessments, Wounds Hx, etc.) ASSESSMENTS - Wound and Skin Assessment / Reassessment []  - Dermatologic / Skin Assessment (not related to wound area) 0 ASSESSMENTS - Ostomy and/or Continence Assessment and Care []  - Incontinence Assessment and Management 0 []  - 0 Ostomy Care Assessment and Management (repouching, etc.) PROCESS - Coordination of Care []  - Simple Patient / Family Education for ongoing care 0 []  - 0 Complex (extensive) Patient / Family Education for ongoing care []  - 0 Staff obtains Programmer, systems, Records, Test Results / Process Orders []  - 0 Staff telephones HHA, Nursing Homes / Clarify orders / etc []  - 0 Routine Transfer to another Facility (non-emergent condition) []  - 0 Routine Hospital Admission (non-emergent condition) []  - 0 New Admissions / Biomedical engineer / Ordering NPWT, Apligraf, etc. []  - 0 Emergency Hospital Admission (emergent condition) PROCESS - Special Needs []  - Pediatric / Minor Patient Management 0 []  - 0 Isolation Patient Management []  - 0 Hearing / Language / Visual special needs []  - 0 Assessment of Community assistance (transportation, D/C planning, etc.) []  - 0 Additional assistance / Altered mentation []  - 0 Support Surface(s) Assessment (bed, cushion, seat, etc.) INTERVENTIONS - Miscellaneous []  - External ear exam 0 []  - 0 Patient Transfer (multiple staff / Civil Service fast streamer / Similar devices) []  - 0 Simple Staple / Suture removal (25 or less) []  - 0 Complex Staple / Suture removal (26 or more) []  - 0 Hypo/Hyperglycemic Management (do not check if billed separately) []  - 0 Ankle / Brachial Index (ABI) - do not check if billed separately Has the patient been seen at the hospital within the last three years: Yes Total Score: 0 Level Of Care: ____ Laura Santiago ([id number]) Electronic Signature(s) Signed:  11/09/2021 2:54:54 PM By: Donnamarie Poag Entered By: Donnamarie Poag on 11/09/2021 09:18:02 Laura Santiago ([id number]) -------------------------------------------------------------------------------- Compression  Therapy Details Patient Name: Laura Santiago, Laura Santiago. Date of Service: 11/09/2021 8:30 AM Medical Record Number: [id number] Patient Account Number: [id number] Date of Birth/Sex: 02-05-1946 (75 y.o. F) Treating RN: Donnamarie Poag Primary Care Duanna Runk: Priscille Kluver Other Clinician: Referring Bailyn Spackman: Randa Evens Treating Aprille Sawhney/Extender: Yaakov Guthrie in Treatment: 18 Compression Therapy Performed for Wound Assessment: Wound #1 Left,Medial Lower Leg Performed By: Clinician Donnamarie Poag, RN Compression Type: Three Layer Post Procedure Diagnosis Same as Pre-procedure Electronic Signature(s) Signed: 11/09/2021 2:54:54 PM By: Donnamarie Poag Entered By: Donnamarie Poag on 11/09/2021 09:06:38 Laura Santiago ([id number]) -------------------------------------------------------------------------------- Encounter Discharge Information Details Patient Name: Laura Santiago. Date of Service: 11/09/2021 8:30 AM Medical Record Number: [id number] Patient Account Number: [id number] Date of Birth/Sex: 21-Sep-1946 (75 y.o. F) Treating RN: Donnamarie Poag Primary Care Roman Sandall: Priscille Kluver Other Clinician: Referring Elizeo Rodriques: Randa Evens Treating Liane Tribbey/Extender: Yaakov Guthrie in Treatment: 24 Encounter Discharge Information Items Discharge Condition: Stable Ambulatory Status: Walker Discharge Destination: Home Transportation: Private Auto Accompanied By: SERVICE DOG Schedule Follow-up Appointment: Yes Clinical Summary of Care: Electronic Signature(s) Signed: 11/09/2021 2:54:54 PM By: Donnamarie Poag Entered By: Donnamarie Poag on 11/09/2021 09:33:38 Laura Santiago ([id number]) -------------------------------------------------------------------------------- Lower  Extremity Assessment Details Patient Name: Laura Santiago. Date of Service: 11/09/2021 8:30 AM Medical Record Number: [id number] Patient Account Number: [id number] Date of Birth/Sex: February 13, 1946 (75 y.o. F) Treating RN: Donnamarie Poag Primary Care Kinjal Neitzke: Orvis Brill, DOCTORS Other Clinician: Referring Laresha Bacorn: Randa Evens Treating Carlei Huang/Extender: Yaakov Guthrie in Treatment: 18 Edema Assessment Assessed: [Left: Yes] Patrice Paradise: Yes] Edema: [Left: No] [Right: Yes] Calf Left: Right: Point of Measurement: 29 cm From Medial Instep 33 cm 41.3 cm Ankle Left: Right: Point of Measurement: 10 cm From Medial Instep 22.5 cm 24.5 cm Vascular Assessment Pulses: Dorsalis Pedis Palpable: [Left:Yes] Electronic Signature(s) Signed: 11/09/2021 2:54:54 PM By: Donnamarie Poag Entered By: Donnamarie Poag on 11/09/2021 08:58:20 Laura Santiago ([id number]) -------------------------------------------------------------------------------- Multi Wound Chart Details Patient Name: Laura Santiago. Date of Service: 11/09/2021 8:30 AM Medical Record Number: [id number] Patient Account Number: [id number] Date of Birth/Sex: [birth year] (75 y.o. 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, Other Comorbid History: Hypertension, Osteoarthritis, Hypertension, Osteoarthritis, Hypertension, Osteoarthritis, Received Chemotherapy, Received Received Chemotherapy, Received Received  Chemotherapy, Received Radiation Radiation Radiation Date Acquired: 04/06/2021 08/24/2021 10/10/2021 Weeks of Treatment: $RemoveBefor'18 11 1 '[id number]$ Wound Status: Open Open Open Clustered Wound: Yes No Yes Clustered Quantity: N/A N/A 2 Measurements L x W x D (cm) 9x10x0.5 0.2x0.2x0.1 0.4x0.4x0.1 Area (cm) : 70.686 0.031 0.126 Volume (cm) : 35.343 0.003 0.013 % Reduction in Area: 21.70% 80.30% 94.70% % Reduction in Volume: -95.70% 95.20% 94.50% Classification: Full Thickness Without Exposed Full Thickness Without Exposed Partial Thickness Support Structures Support Structures Exudate Amount: Large Medium Small Exudate Type: Serosanguineous Serous Serous Exudate Color: red, brown amber amber Wound Margin: N/A Thickened N/A Granulation Amount: Large (67-100%) Large (67-100%) Small (1-33%) Granulation Quality: Red, Pink Red Pink Necrotic Amount: Small (1-33%) Small (1-33%) Large (67-100%) Necrotic Tissue: Adherent [employer] Eschar Exposed Structures: Fat Layer (Subcutaneous Tissue): Fat Layer (Subcutaneous Tissue): Fat Layer (Subcutaneous Tissue): Yes Yes Yes Fascia: No Fascia: No Fascia: No  Tendon: No Tendon: No Tendon: No Muscle: No Muscle: No Muscle: No Laura Santiago, Laura Santiago ([id number]) Joint: No Joint: No Joint: No Bone: No Bone: No Bone: No Epithelialization: N/A None Small (1-33%) Procedures Performed: Compression Therapy N/A N/A Treatment Notes Electronic Signature(s) Signed: 11/09/2021 9:43:34 AM By: Kalman Shan DO Entered By: Kalman Shan on 11/09/2021 09:30:14 Laura Santiago ([id number]) -------------------------------------------------------------------------------- Multi-Disciplinary Care Plan Details Patient Name: Laura Santiago. Date of Service: 11/09/2021 8:30 AM Medical Record Number: [id number] Patient Account Number: [id number] Date of Birth/Sex: [birth year] (75 y.o. F) Treating RN: Donnamarie Poag Primary Care Shirelle Tootle: Priscille Kluver Other  Clinician: Referring Aliahna Statzer: Randa Evens Treating Kateena Degroote/Extender: Yaakov Guthrie in Treatment: 18 Active Inactive Wound/Skin Impairment Nursing Diagnoses: Impaired tissue integrity Goals: Patient/caregiver will verbalize understanding of skin care regimen Date Initiated: 07/06/2021 Date Inactivated: 07/27/2021 Target Resolution Date: 07/06/2021 Goal Status: Met Ulcer/skin breakdown will have a volume reduction of 30% by week 4 Date Initiated: 07/06/2021 Date Inactivated: 10/12/2021 Target Resolution Date: 08/06/2021 Goal Status: Unmet Unmet Reason: cont tx Ulcer/skin breakdown will have a volume reduction of 50% by week 8 Date Initiated: 07/06/2021 Target Resolution Date: 09/06/2021 Goal Status: Active Ulcer/skin breakdown will have a volume reduction of 80% by week 12 Date Initiated: 07/06/2021 Target Resolution Date: 10/06/2021 Goal Status: Active Ulcer/skin breakdown will heal within 14 weeks Date Initiated: 07/06/2021 Target Resolution Date: 11/06/2021 Goal Status: Active Interventions: Assess patient/caregiver ability to obtain necessary supplies Assess patient/caregiver ability to perform ulcer/skin care regimen upon admission and as needed Assess ulceration(s) every visit Treatment Activities: Referred to DME Joeseph Verville for dressing supplies : 07/06/2021 Skin care regimen initiated : 07/06/2021 Notes: Electronic Signature(s) Signed: 11/09/2021 2:54:54 PM By: Donnamarie Poag Entered By: Donnamarie Poag on 11/09/2021 08:59:57 Laura Santiago ([id number]) -------------------------------------------------------------------------------- Non-Wound Condition Assessment Details Patient Name: Laura Santiago. Date of Service: 11/09/2021 8:30 AM Medical Record Number: [id number] Patient Account Number: [id number] Date of Birth/Sex: 28-Nov-1946 (75 y.o. F) Treating RN: Donnamarie Poag Primary Care Deeanna Beightol: Priscille Kluver Other Clinician: Referring Kyrstal Monterrosa: Randa Evens Treating Jovi Alvizo/Extender: Yaakov Guthrie in Treatment: 18 Non-Wound Condition: Condition: Cellulitis Location: Leg Side: Right Photos Electronic Signature(s) Signed: 11/09/2021 2:54:54 PM By: Donnamarie Poag Entered By: Donnamarie Poag on 11/09/2021 08:59:33 Laura Santiago ([id number]) -------------------------------------------------------------------------------- Pain Assessment Details Patient Name: Laura Santiago. Date of Service: 11/09/2021 8:30 AM Medical Record Number: [id number] Patient Account Number: [id number] Date of Birth/Sex: August 02, 1946 (75 y.o. F) Treating RN: Donnamarie Poag Primary Care Shynia Daleo: Priscille Kluver Other Clinician: Referring Juandaniel Manfredo: Randa Evens Treating Gerlean Cid/Extender: Yaakov Guthrie in Treatment: 18 Active Problems Location of Pain Severity and Description of Pain Patient Has Paino No Site Locations Rate the pain. Current Pain Level: 0 Pain Management and Medication Current Pain Management: Electronic Signature(s) Signed: 11/09/2021 2:54:54 PM By: Donnamarie Poag Entered By: Donnamarie Poag on 11/09/2021 08:43:40 Laura Santiago ([id number]) -------------------------------------------------------------------------------- Patient/Caregiver Education Details Patient Name: Laura Santiago. Date of Service: 11/09/2021 8:30 AM Medical Record Number: [id number] Patient Account Number: [id number] Date of Birth/Gender: 07-Jul-1946 (75 y.o. F) Treating RN: Donnamarie Poag Primary Care Physician: Orvis Brill, DOCTORS Other Clinician: Referring Physician: Randa Evens Treating Physician/Extender: Yaakov Guthrie in Treatment: 7 Education Assessment Education Provided To: Patient Education Topics Provided Basic Hygiene: Wound/Skin Impairment: Electronic Signature(s) Signed: 11/09/2021 2:54:54 PM By: Donnamarie Poag Entered By: Donnamarie Poag on 11/09/2021 09:32:34 Laura Santiago  ([id number]) -------------------------------------------------------------------------------- Wound Assessment Details Patient Name: Laura Santiago. Date of Service: 11/09/2021 8:30 AM Medical Record Number: [id number] Patient Account Number: [id number] Date of  Birth/Sex: August 26, 1946 (75 y.o. F) Treating RN: Donnamarie Poag Primary Care Nahun Kronberg: Orvis Brill, DOCTORS Other Clinician: Referring Vonetta Foulk: Randa Evens Treating Ludwika Rodd/Extender: Yaakov Guthrie in Treatment: 18 Wound Status Wound Number: 1 Primary Venous Leg Ulcer Etiology: Wound Location: Left, Medial Lower Leg Wound Status: Open Wounding Event: Gradually Appeared Comorbid Hypertension, Osteoarthritis, Received Chemotherapy, Date Acquired: 04/06/2021 History: Received Radiation Weeks Of Treatment: 18 Clustered Wound: Yes Photos Wound Measurements Length: (cm) 9 Width: (cm) 10 Depth: (cm) 0.5 Area: (cm) 70.686 Volume: (cm) 35.343 % Reduction in Area: 21.7% % Reduction in Volume: -95.7% Tunneling: No Undermining: No Wound Description Classification: Full Thickness Without Exposed Support Structu Exudate Amount: Large Exudate Type: Serosanguineous Exudate Color: red, brown res Foul Odor After Cleansing: No Slough/Fibrino Yes Wound Bed Granulation Amount: Large (67-100%) Exposed Structure Granulation Quality: Red, Pink Fascia Exposed: No Necrotic Amount: Small (1-33%) Fat Layer (Subcutaneous Tissue) Exposed: Yes Necrotic Quality: Adherent Slough Tendon Exposed: No Muscle Exposed: No Joint Exposed: No Bone Exposed: No Treatment Notes Wound #1 (Lower Leg) Wound Laterality: Left, Medial Cleanser Soap and Water Discharge Instruction: Gently cleanse wound with antibacterial soap, rinse and pat dry prior to dressing wounds Peri-Wound Care Desitin Maximum Strength Ointment 4 (oz) Laura Santiago, Laura Santiago ([id number]) Discharge Instruction: Apply periwound Topical Primary Dressing Cutimed Sorbact 1.5x  2.38 (in/in) Discharge Instruction: DAKINS MOISTENED- Secondary Dressing Xtrasorb Large 6x9 (in/in) Discharge Instruction: Apply to wound as directed. Do not cut. Secured With Compression Wrap Medichoice 4 layer Compression System, 35-40 mmHG Discharge Instruction: Apply multi-layer wrap as directed. Compression Stockings Add-Ons Electronic Signature(s) Signed: 11/09/2021 2:54:54 PM By: Donnamarie Poag Entered By: Donnamarie Poag on 11/09/2021 08:53:47 Laura Santiago ([id number]) -------------------------------------------------------------------------------- Wound Assessment Details Patient Name: Laura Santiago. Date of Service: 11/09/2021 8:30 AM Medical Record Number: [id number] Patient Account Number: [id number] Date of Birth/Sex: 28-Aug-1946 (75 y.o. F) Treating RN: Donnamarie Poag Primary Care Jsean Taussig: Orvis Brill, DOCTORS Other Clinician: Referring Verba Ainley: Randa Evens Treating Dao Memmott/Extender: Yaakov Guthrie in Treatment: 18 Wound Status Wound Number: 2 Primary Neuropathic Ulcer-Non Diabetic Etiology: Wound Location: Right Toe Great Wound Status: Open Wounding Event: Gradually Appeared Comorbid Hypertension, Osteoarthritis, Received Chemotherapy, Date Acquired: 08/24/2021 History: Received Radiation Weeks Of Treatment: 11 Clustered Wound: No Photos Wound Measurements Length: (cm) 0.2 Width: (cm) 0.2 Depth: (cm) 0.1 Area: (cm) 0.031 Volume: (cm) 0.003 % Reduction in Area: 80.3% % Reduction in Volume: 95.2% Epithelialization: None Tunneling: No Undermining: No Wound Description Classification: Full Thickness Without Exposed Support Structures Wound Margin: Thickened Exudate Amount: Medium Exudate Type: Serous Exudate Color: amber Foul Odor After Cleansing: No Slough/Fibrino Yes Wound Bed Granulation Amount: Large (67-100%) Exposed Structure Granulation Quality: Red Fascia Exposed: No Necrotic Amount: Small (1-33%) Fat Layer (Subcutaneous Tissue)  Exposed: Yes Necrotic Quality: Adherent Slough Tendon Exposed: No Muscle Exposed: No Joint Exposed: No Bone Exposed: No Treatment Notes Wound #2 (Toe Great) Wound Laterality: Right Cleanser Wound Cleanser Discharge Instruction: Wash your hands with soap and water. Remove old dressing, discard into plastic bag and place into trash. Cleanse the wound with Wound Cleanser prior to applying a clean dressing using gauze sponges, not tissues or cotton balls. Do not scrub or use excessive force. Pat dry using gauze sponges, not tissue or cotton balls. KERRIA, SAPIEN ([id number]) Peri-Wound Care Topical Primary Dressing Silvercel Small 2x2 (in/in) Discharge Instruction: Apply Silvercel Small 2x2 (in/in) as instructed Secondary Dressing Gauze Discharge Instruction: Cover with dry gauze Secured With 28M Medipore H Soft Cloth Surgical Tape, 2x2 (in/yd) Discharge Instruction: Secure dressing Compression Wrap Compression Stockings  Add-Ons Electronic Signature(s) Signed: 11/09/2021 2:54:54 PM By: Donnamarie Poag Entered By: Donnamarie Poag on 11/09/2021 08:55:09 Laura Santiago ([id number]) -------------------------------------------------------------------------------- Wound Assessment Details Patient Name: Laura Santiago, Laura Santiago. Date of Service: 11/09/2021 8:30 AM Medical Record Number: [id number] Patient Account Number: [id number] Date of Birth/Sex: Sep 21, 1946 (75 y.o. F) Treating RN: Donnamarie Poag Primary Care Dehlia Kilner: Orvis Brill, DOCTORS Other Clinician: Referring Bernarda Erck: Randa Evens Treating Matther Labell/Extender: Yaakov Guthrie in Treatment: 18 Wound Status Wound Number: 5 Primary Trauma, Other Etiology: Wound Location: Left, Lateral Lower Leg Wound Status: Open Wounding Event: Trauma Comorbid Hypertension, Osteoarthritis, Received Chemotherapy, Date Acquired: 10/10/2021 History: Received Radiation Weeks Of Treatment: 1 Clustered Wound: Yes Photos Wound  Measurements Length: (cm) 0.4 Width: (cm) 0.4 Depth: (cm) 0.1 Clustered Quantity: 2 Area: (cm) 0.126 Volume: (cm) 0.013 % Reduction in Area: 94.7% % Reduction in Volume: 94.5% Epithelialization: Small (1-33%) Tunneling: No Undermining: No Wound Description Classification: Partial Thickness Exudate Amount: Small Exudate Type: Serous Exudate Color: amber Foul Odor After Cleansing: No Slough/Fibrino Yes Wound Bed Granulation Amount: Small (1-33%) Exposed Structure Granulation Quality: Pink Fascia Exposed: No Necrotic Amount: Large (67-100%) Fat Layer (Subcutaneous Tissue) Exposed: Yes Necrotic Quality: Eschar Tendon Exposed: No Muscle Exposed: No Joint Exposed: No Bone Exposed: No Treatment Notes Wound #5 (Lower Leg) Wound Laterality: Left, Lateral Cleanser Normal Saline Discharge Instruction: Wash your hands with soap and water. Remove old dressing, discard into plastic bag and place into trash. Cleanse the wound with Normal Saline prior to applying a clean dressing using gauze sponges, not tissues or cotton balls. Do not scrub or use excessive force. Pat dry using gauze sponges, not tissue or cotton balls. Laura Santiago, Laura Santiago ([id number]) Soap and Water Discharge Instruction: Gently cleanse wound with antibacterial soap, rinse and pat dry prior to dressing wounds Peri-Wound Care Topical Santyl Collagenase Ointment, 30 (gm), tube Discharge Instruction: in office only Primary Dressing Hydrofera Blue Ready Transfer Foam, 2.5x2.5 (in/in) Discharge Instruction: Apply Hydrofera Blue Ready to wound bed as directed Secondary Dressing Secured With Compression Wrap Medichoice 4 layer Compression System, 35-40 mmHG Discharge Instruction: Apply multi-layer wrap as directed. Compression Stockings Add-Ons Electronic Signature(s) Signed: 11/09/2021 2:54:54 PM By: Donnamarie Poag Entered By: Donnamarie Poag on 11/09/2021 08:56:20 Laura Santiago  ([id number]) -------------------------------------------------------------------------------- [place] Details Patient Name: Laura Santiago. Date of Service: 11/09/2021 8:30 AM Medical Record Number: [id number] Patient Account Number: [id number] Date of Birth/Sex: [birth year] (75 y.o. F) Treating RN: Donnamarie Poag Primary Care Arnold Depinto: Orvis Brill, DOCTORS Other Clinician: Referring Caelen Higinbotham: Randa Evens Treating Shirlie Enck/Extender: Yaakov Guthrie in Treatment: 18 Vital Signs Time Taken: 08:40 Temperature (F): 98.1 Height (in): 66 Pulse (bpm): 94 Weight (lbs): 153 Respiratory Rate (breaths/min): 16 Body Mass Index (BMI): 24.7 Blood Pressure (mmHg): 125/71 Reference Range: 80 - 120 mg / dl Electronic Signature(s) Signed: 11/09/2021 2:54:54 PM By: Donnamarie Poag Entered ByDonnamarie Poag on 11/09/2021 08:43:31

## 2021-11-11 ENCOUNTER — Other Ambulatory Visit: Payer: Self-pay | Admitting: *Deleted

## 2021-11-11 ENCOUNTER — Inpatient Hospital Stay: Payer: Medicare Other | Attending: Oncology

## 2021-11-11 ENCOUNTER — Inpatient Hospital Stay (HOSPITAL_BASED_OUTPATIENT_CLINIC_OR_DEPARTMENT_OTHER): Payer: Medicare Other | Admitting: Oncology

## 2021-11-11 ENCOUNTER — Other Ambulatory Visit: Payer: Self-pay

## 2021-11-11 ENCOUNTER — Encounter: Payer: Self-pay | Admitting: Oncology

## 2021-11-11 ENCOUNTER — Inpatient Hospital Stay: Payer: Medicare Other

## 2021-11-11 VITALS — Temp 97.0°F

## 2021-11-11 VITALS — BP 144/80 | HR 91 | Temp 97.9°F | Resp 16 | Ht 63.0 in | Wt 156.8 lb

## 2021-11-11 DIAGNOSIS — Z170 Estrogen receptor positive status [ER+]: Secondary | ICD-10-CM | POA: Diagnosis not present

## 2021-11-11 DIAGNOSIS — C7951 Secondary malignant neoplasm of bone: Secondary | ICD-10-CM

## 2021-11-11 DIAGNOSIS — C779 Secondary and unspecified malignant neoplasm of lymph node, unspecified: Secondary | ICD-10-CM | POA: Diagnosis not present

## 2021-11-11 DIAGNOSIS — T451X5A Adverse effect of antineoplastic and immunosuppressive drugs, initial encounter: Secondary | ICD-10-CM

## 2021-11-11 DIAGNOSIS — C50919 Malignant neoplasm of unspecified site of unspecified female breast: Secondary | ICD-10-CM

## 2021-11-11 DIAGNOSIS — C787 Secondary malignant neoplasm of liver and intrahepatic bile duct: Secondary | ICD-10-CM | POA: Insufficient documentation

## 2021-11-11 DIAGNOSIS — G620 Drug-induced polyneuropathy: Secondary | ICD-10-CM | POA: Diagnosis not present

## 2021-11-11 DIAGNOSIS — C50911 Malignant neoplasm of unspecified site of right female breast: Secondary | ICD-10-CM

## 2021-11-11 DIAGNOSIS — Z5111 Encounter for antineoplastic chemotherapy: Secondary | ICD-10-CM | POA: Diagnosis not present

## 2021-11-11 DIAGNOSIS — Z7189 Other specified counseling: Secondary | ICD-10-CM

## 2021-11-11 DIAGNOSIS — Z7983 Long term (current) use of bisphosphonates: Secondary | ICD-10-CM

## 2021-11-11 DIAGNOSIS — Z5112 Encounter for antineoplastic immunotherapy: Secondary | ICD-10-CM | POA: Insufficient documentation

## 2021-11-11 DIAGNOSIS — Z5181 Encounter for therapeutic drug level monitoring: Secondary | ICD-10-CM

## 2021-11-11 LAB — CBC WITH DIFFERENTIAL/PLATELET
Abs Immature Granulocytes: 0.02 10*3/uL (ref 0.00–0.07)
Basophils Absolute: 0 10*3/uL (ref 0.0–0.1)
Basophils Relative: 0 %
Eosinophils Absolute: 0.1 10*3/uL (ref 0.0–0.5)
Eosinophils Relative: 1 %
HCT: 28.7 % — ABNORMAL LOW (ref 36.0–46.0)
Hemoglobin: 9 g/dL — ABNORMAL LOW (ref 12.0–15.0)
Immature Granulocytes: 0 %
Lymphocytes Relative: 14 %
Lymphs Abs: 0.8 10*3/uL (ref 0.7–4.0)
MCH: 28.8 pg (ref 26.0–34.0)
MCHC: 31.4 g/dL (ref 30.0–36.0)
MCV: 92 fL (ref 80.0–100.0)
Monocytes Absolute: 0.6 10*3/uL (ref 0.1–1.0)
Monocytes Relative: 11 %
Neutro Abs: 4 10*3/uL (ref 1.7–7.7)
Neutrophils Relative %: 74 %
Platelets: 256 10*3/uL (ref 150–400)
RBC: 3.12 MIL/uL — ABNORMAL LOW (ref 3.87–5.11)
RDW: 15.9 % — ABNORMAL HIGH (ref 11.5–15.5)
WBC: 5.5 10*3/uL (ref 4.0–10.5)
nRBC: 0 % (ref 0.0–0.2)

## 2021-11-11 LAB — COMPREHENSIVE METABOLIC PANEL
ALT: 10 U/L (ref 0–44)
AST: 18 U/L (ref 15–41)
Albumin: 3.5 g/dL (ref 3.5–5.0)
Alkaline Phosphatase: 63 U/L (ref 38–126)
Anion gap: 8 (ref 5–15)
BUN: 22 mg/dL (ref 8–23)
CO2: 23 mmol/L (ref 22–32)
Calcium: 8 mg/dL — ABNORMAL LOW (ref 8.9–10.3)
Chloride: 106 mmol/L (ref 98–111)
Creatinine, Ser: 1.23 mg/dL — ABNORMAL HIGH (ref 0.44–1.00)
GFR, Estimated: 45 mL/min — ABNORMAL LOW (ref >60)
Glucose, Bld: 119 mg/dL — ABNORMAL HIGH (ref 70–99)
Potassium: 4.1 mmol/L (ref 3.5–5.1)
Sodium: 137 mmol/L (ref 135–145)
Total Bilirubin: 0.4 mg/dL (ref 0.3–1.2)
Total Protein: 6.5 g/dL (ref 6.5–8.1)

## 2021-11-11 MED ORDER — SODIUM CHLORIDE 0.9 % IV SOLN
Freq: Once | INTRAVENOUS | Status: AC
  Filled 2021-11-11: qty 250

## 2021-11-11 MED ORDER — TRASTUZUMAB-DKST CHEMO 150 MG IV SOLR
6.0000 mg/kg | Freq: Once | INTRAVENOUS | Status: AC
  Administered 2021-11-11: 420 mg via INTRAVENOUS
  Filled 2021-11-11: qty 20

## 2021-11-11 MED ORDER — HEPARIN SOD (PORK) LOCK FLUSH 100 UNIT/ML IV SOLN
INTRAVENOUS | Status: AC
  Filled 2021-11-11: qty 5

## 2021-11-11 MED ORDER — DIPHENHYDRAMINE HCL 50 MG/ML IJ SOLN
50.0000 mg | Freq: Once | INTRAMUSCULAR | Status: AC
  Administered 2021-11-11: 50 mg via INTRAVENOUS
  Filled 2021-11-11: qty 1

## 2021-11-11 MED ORDER — ACETAMINOPHEN 325 MG PO TABS
650.0000 mg | ORAL_TABLET | Freq: Once | ORAL | Status: AC
  Administered 2021-11-11: 650 mg via ORAL
  Filled 2021-11-11: qty 2

## 2021-11-11 MED ORDER — FAMOTIDINE 20 MG IN NS 100 ML IVPB
20.0000 mg | Freq: Once | INTRAVENOUS | Status: AC
  Administered 2021-11-11: 20 mg via INTRAVENOUS
  Filled 2021-11-11: qty 20
  Filled 2021-11-11: qty 100

## 2021-11-11 MED ORDER — SODIUM CHLORIDE 0.9 % IV SOLN
65.0000 mg/m2 | Freq: Once | INTRAVENOUS | Status: AC
  Administered 2021-11-11: 114 mg via INTRAVENOUS
  Filled 2021-11-11: qty 19

## 2021-11-11 MED ORDER — SODIUM CHLORIDE 0.9 % IV SOLN
420.0000 mg | Freq: Once | INTRAVENOUS | Status: AC
  Administered 2021-11-11: 420 mg via INTRAVENOUS
  Filled 2021-11-11: qty 14

## 2021-11-11 MED ORDER — SODIUM CHLORIDE 0.9 % IV SOLN
10.0000 mg | Freq: Once | INTRAVENOUS | Status: AC
  Administered 2021-11-11: 10 mg via INTRAVENOUS
  Filled 2021-11-11: qty 10

## 2021-11-11 NOTE — Progress Notes (Signed)
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  She had a screening mammogram in September 2014 which showed 2.1 x 2.3 x 1.8 cm irregular mass in her right breast.  It was ER 10% positive PR negative and HER2 positive +3.  She received neoadjuvant chemotherapy with Taxol Herceptin and Perjeta for 4 cycles followed by dose dense AC/Herceptin x4 which she completed in March 2015.  She had a right lumpectomy on 04/03/2014 which showed scant residual invasive ductal carcinoma [id number].  She completed 1 year of adjuvant Herceptin chemotherapy and also completed adjuvant radiation treatment.  She was recommended anastrozole which she took on and off starting November 2015 and stopped sometime in 2020.   She was then hospitalized with neck pain and was found to have lytic lesions involving C5-C6 with pathological vertebral fractures.  She underwent radiation treatment to this area.  Image guided biopsy of the L1 vertebral body showed metastatic carcinoma consistent with breast origin ER 10% PR 0% and HER2 amplified ratio 5.1 average HER2 signal number per cell 15.0 average CEP 17 signals number per cell 3.0.  Baseline echocardiogram on 05/02/2021 showed a normal EF of 62% she was  recommended Taxol Herceptin and Perjeta which she received for 2 cycles at [facility] until June 10, 2021   She has chronic pain from her bone metastases for which she is currently on oxycodone 10 mg every 4 hours as needed and 12 mcg fentanyl patch.  She was seeing pain clinic when she was living in [name].   Patient has also been on Zometa when she was in [name] but she does have some ongoing dental issues.  She has received Xgeva in the past as well.  Her last Delton See was in July 2021.  Last PET scan was on 03/21/2021 which showed diffuse osseous metastatic disease involving the head neck chest abdomen and pelvis and spine.  Left lung apex hypermetabolic nodule and multiple hypermetabolic liver lesions concerning for disease involvement.  Enlarged hypermetabolic left inguinal lymph nodes along with hypermetabolic external iliac and left supraclavicular lymph nodes   Patient is now moved to [place] to be close to her daughter.  She lives in an independent living. Currently patient is getting Taxol Herceptin and Perjeta every 3 weeks with weekly Taxol    Interval history-patient continues to feel as if skin is crawling down her face and covering her eyes and sometimes she has to pull her skin up to be able to see.  This symptoms have not been corroborated on physical exam.  Has chronic bilateral leg swelling and left lower extremity wounds.  She finds it difficult to grab onto things and problems with imbalance.  No recent falls  ECOG PS- 2 Pain scale- 3 Opioid associated constipation- no  Review of systems- Review of Systems  Constitutional:  Positive for malaise/fatigue. Negative for  chills, fever and weight loss.  HENT:  Negative for congestion, ear discharge and nosebleeds.   Eyes:  Negative for blurred vision.  Respiratory:  Negative for cough, hemoptysis, sputum production, shortness of breath and wheezing.   Cardiovascular:  Negative for chest pain, palpitations, orthopnea and claudication.   Gastrointestinal:  Negative for abdominal pain, blood in stool, constipation, diarrhea, heartburn, melena, nausea and vomiting.  Genitourinary:  Negative for dysuria, flank pain, frequency, hematuria and urgency.  Musculoskeletal:  Negative for back pain, joint pain and myalgias.  Skin:  Negative for rash.  Neurological:  Positive for sensory change (Peripheral neuropathy). Negative for dizziness, tingling, focal weakness, seizures, weakness and headaches.  Endo/Heme/Allergies:  Does not bruise/bleed easily.  Psychiatric/Behavioral:  Negative for depression and suicidal ideas. The patient does not have insomnia.      Allergies  Allergen Reactions   Corticosteroids Other (See Comments)    Pt trf from [name] and per primary md for her cancer tx. Notes that it causes agitation intolerance   Sulfa Antibiotics Other (See Comments)    Pt moved from [name] and in MD notes she has allergy but we do not know reactions when taking the drug   Celebrex [Celecoxib] Rash     Past Medical History:  Diagnosis Date   ADHD (attention deficit hyperactivity disorder)    in [place], no date on md note   Anemia    IDA 11/26/2019, Anemia in stage 4 chronic kidney disease ([place]) 09/03/2020   Arthritis    osteoarthritis right knee 09/30/2014   Breast cancer ([place]) 10/05/2013   in [place] +, [place] -, Her 2 is 3+   Cancer related pain 03/28/2021   in [name], md notes spine mets   DVT of lower extremity, bilateral ([place]) 03/30/2014   in [name]   Generalized muscle weakness 03/31/2016   in [name]   GERD (gastroesophageal reflux disease) 05/15/2013   per md in [name]   Hyperparathyroidism, secondary ([place]) 04/25/2018   in [name]   Hypertension 02/20/2016   info from MD in [facility]   Lumbar compression fracture ([place]) 03/18/2021   in [place] loss 02/05/2015   in [name]   Metabolic acidosis 03/54/6568   in [place]   Metabolic syndrome 12/75/1700   in [name]   Osteopenia after menopause 03/29/2016   in [place] of  both eyes 09/30/2012   per md in [name] where pt. 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   in [name]   Stasis ulcer of left lower extremity ([place]) 03/29/2016   in [name]     Past Surgical History:  Procedure Laterality Date   CESAREAN SECTION     unknown   fibroid removed  N/A    in [place] - unknown date   IR FLUORO GUIDE CV LINE LEFT  07/27/2021   IR PORT REPAIR CENTRAL VENOUS ACCESS DEVICE Left    In [place] N/A 02/25/2014   in [name]   ovary removed      unknown   [place] CATARACT EXTRACAP,INSERT LENS Bilateral  Bilateral 09/05/2012   in [place]     unknown    LUMPECTOMY Right 04/03/2014   in [place]    Social History   Socioeconomic History   Marital status: Divorced    Spouse name: Not on file   Number of children: Not on file   Years of education: Not on file   Highest education level: Not  on file  Occupational History   Occupation: retired Teacher, music    Comment: In [place]  Tobacco Use   Smoking status: Never   Smokeless tobacco: Never  Vaping Use   Vaping Use: Never used  Substance and Sexual Activity   Alcohol use: Not Currently   Drug use: Never   Sexual activity: Not Currently  Other Topics Concern   Not on file  Social History Narrative   Not on file   Social Determinants of Health   Financial Resource Strain: Not on file  Food Insecurity: Not on file  Transportation Needs: Not on file  Physical Activity: Not on file  Stress: Not on file  Social Connections: Not on file  Intimate Partner Violence: Not on file    Family History  Problem Relation Age of Onset   Pancreatic cancer Mother    Stroke Father    Diabetes Father    Hypertension Father    Heart disease Father    Skin cancer Father    Varicose Veins Father    Skin cancer Brother    Cancer - Prostate Brother      Current Outpatient Medications:    acetaminophen (TYLENOL) 500 MG tablet, 1 tablet as needed, Disp: , Rfl:     amphetamine-dextroamphetamine (ADDERALL XR) 30 MG 24 hr capsule, Take 1 capsule (30 mg total) by mouth daily., Disp: 30 capsule, Rfl: 0   Calcium 200 MG TABS, Take 1 tablet by mouth daily., Disp: , Rfl:    cephALEXin (KEFLEX) 500 MG capsule, Take 500 mg by mouth every 6 (six) hours., Disp: , Rfl:    fentaNYL (DURAGESIC) 12 MCG/HR, Place 1 patch onto the skin every 3 (three) days. 1 patch to skin, Disp: 10 patch, Rfl: 0   gabapentin (NEURONTIN) 400 MG capsule, 1 capsule, Disp: , Rfl:    gentamicin cream (GARAMYCIN) 0.1 %, Apply topically daily., Disp: , Rfl:    lisinopril (ZESTRIL) 20 MG tablet, 1 tablet, Disp: , Rfl:    LORazepam (ATIVAN) 0.5 MG tablet, Take 1 tablet (0.5 mg total) by mouth daily as needed for anxiety., Disp: 30 tablet, Rfl: 0   Multiple Vitamins-Minerals (MULTIVITAMIN ADULTS) TABS, See admin instructions., Disp: , Rfl:    nystatin (MYCOSTATIN) 100000 UNIT/ML suspension, Take 5 mLs by mouth 4 (four) times daily., Disp: , Rfl:    Oxycodone HCl 10 MG TABS, TAKE ONE TABLET BY MOUTH EVERY 4 HOURS AS NEEDED, Disp: 84 tablet, Rfl: 0   rivaroxaban (XARELTO) 20 MG TABS tablet, Take 1 tablet (20 mg total) by mouth daily with supper., Disp: 30 tablet, Rfl: 3   SANTYL ointment, Apply topically as directed., Disp: , Rfl:    terbinafine (LAMISIL AT) 1 % cream, Apply 1 application topically 2 (two) times daily., Disp: 30 g, Rfl: 5   zolpidem (AMBIEN) 5 MG tablet, Take 1 tablet (5 mg total) by mouth at bedtime., Disp: 30 tablet, Rfl: 0   cyclobenzaprine (FLEXERIL) 10 MG tablet, Take by mouth. (Patient not taking: Reported on 09/30/2021), Disp: , Rfl:    naloxone (NARCAN) nasal spray 4 mg/0.1 mL, Spray entire contents of inhaler into nostril for suspected opioid overdose.  Call 911.  Repeat in other nostril in 2-3 minutes if needed. (Patient not taking: Reported on 10/21/2021), Disp: , Rfl:    NARCAN 4 MG/0.1ML LIQD nasal spray kit, SMARTSIG:1 Spray(s) Both Nares Once PRN (Patient not taking:  Reported on 09/30/2021), Disp: , Rfl:    ondansetron (ZOFRAN-ODT) 8 MG disintegrating tablet,  1 tablet on the tongue and allow to dissolve  as needed (Patient not taking: Reported on 09/30/2021), Disp: , Rfl:    potassium chloride SA (KLOR-CON) 20 MEQ tablet, Take 1 tablet (20 mEq total) by mouth daily. (Patient not taking: Reported on 09/30/2021), Disp: 7 tablet, Rfl: 0 No current facility-administered medications for this visit.  Facility-Administered Medications Ordered in Other Visits:    heparin lock flush 100 UNIT/ML injection, , , ,    sodium chloride flush (NS) 0.9 % injection 10 mL, 10 mL, Intracatheter, PRN, [name], MD  Physical exam:  Vitals:   11/11/21 1103  BP: (!) 144/80  Pulse: 91  Resp: 16  Temp: 97.9 F (36.6 C)  TempSrc: Oral  Weight: 156 lb 12.8 oz (71.1 kg)  Height: _0  (1.6 m)   Physical Exam Constitutional:      General: She is not in acute distress.    Comments: She ambulates with a walker.  Cardiovascular:     Rate and Rhythm: Normal rate and regular rhythm.     Heart sounds: Normal heart sounds.  Pulmonary:     Effort: Pulmonary effort is normal.     Breath sounds: Normal breath sounds.  Musculoskeletal:     Comments: Left lower extremity dressing in place over chronic wounds.  Chronic right lower extremity erythema.  Skin:    General: Skin is warm and dry.  Neurological:     Mental Status: She is alert and oriented to person, place, and time.     CMP Latest Ref Rng & Units 11/11/2021  Glucose 70 - 99 mg/dL 119(H)  BUN 8 - 23 mg/dL 22  Creatinine 0.44 - 1.00 mg/dL 1.23(H)  Sodium 135 - 145 mmol/L 137  Potassium 3.5 - 5.1 mmol/L 4.1  Chloride 98 - 111 mmol/L 106  CO2 22 - 32 mmol/L 23  Calcium 8.9 - 10.3 mg/dL 8.0(L)  Total Protein 6.5 - 8.1 g/dL 6.5  Total Bilirubin 0.3 - 1.2 mg/dL 0.4  Alkaline Phos 38 - 126 U/L 63  AST 15 - 41 U/L 18  ALT 0 - 44 U/L 10   CBC Latest Ref Rng & Units 11/11/2021  WBC 4.0 - 10.5 K/uL 5.5   Hemoglobin 12.0 - 15.0 g/dL 9.0(L)  Hematocrit 36.0 - 46.0 % 28.7(L)  Platelets 150 - 400 K/uL 256    No images are attached to the encounter.  NM Bone Scan Whole Body  Result Date: 11/10/2021 CLINICAL DATA:  Metastatic breast cancer, restaging EXAM: NUCLEAR MEDICINE WHOLE BODY BONE SCAN TECHNIQUE: Whole body anterior and posterior images were obtained approximately 3 hours after intravenous injection of radiopharmaceutical. RADIOPHARMACEUTICALS:  22.7 mCi Technetium-76mMDP IV COMPARISON:  08/04/2021 Correlation: CT chest abdomen pelvis 11/07/2021 FINDINGS: Abnormal foci of increased tracer uptake are seen within the frontal bone, proximal RIGHT humerus, medial RIGHT clavicle, cervical spine, thoracic spine, upper lumbar spine, and RIGHT pelvis consistent with osseous metastases. Pattern of uptake is similar to that seen on the previous exam. Single new focus of increased uptake at the lateral margin of the RIGHT femoral head. Degenerative type uptake at shoulders, LEFT elbow, RIGHT knee, and sternoclavicular joints. Expected urinary tract and soft tissue distribution of tracer. IMPRESSION: Multiple sites of abnormal tracer uptake consistent with osseous metastatic disease. Single new site of uptake at the lateral aspect of the RIGHT femoral head. Remaining sites are unchanged. Electronically Signed   By: MLavonia DanaM.D.   On: 11/10/2021 11:38   ECHOCARDIOGRAM COMPLETE  Result  Date: 11/07/2021    ECHOCARDIOGRAM REPORT   Patient Name:   JENEL GIERKE Date of Exam: 11/07/2021 Medical Rec #:  [id number]        Height:       63.0 in Accession #:    [id number]       Weight:       153.8 lb Date of Birth:  14-Jun-1946        BSA:          1.729 m Patient Age:    78 years         BP:           123/72 mmHg Patient Gender: F                HR:           93 bpm. Exam Location:  [facility] Procedure: 2D Echo, Cardiac Doppler, Color Doppler and Strain Analysis Indications:     Breast cancer in female  History:          Patient has prior history of Echocardiogram examinations, most                  recent 08/01/2021. Risk Factors:Hypertension. DVT.  Sonographer:     Sherrie Sport Referring Phys:  [id number] [facility] C Altan Kraai Diagnosing Phys: Kathlyn Sacramento MD  Sonographer Comments: Global longitudinal strain was attempted. IMPRESSIONS  1. Left ventricular ejection fraction, by estimation, is 60 to 65%. The left ventricle has normal function. The left ventricle has no regional wall motion abnormalities. Left ventricular diastolic parameters were normal. The average left ventricular global longitudinal strain is -16.7 %. The global longitudinal strain is normal.  2. Right ventricular systolic function is normal. The right ventricular size is normal. Tricuspid regurgitation signal is inadequate for assessing PA pressure.  3. Left atrial size was mildly dilated.  4. The mitral valve is normal in structure. No evidence of mitral valve regurgitation. No evidence of mitral stenosis.  5. The aortic valve is normal in structure. Aortic valve regurgitation is not visualized. Aortic valve sclerosis is present, with no evidence of aortic valve stenosis.  6. The inferior vena cava is normal in size with <50% respiratory variability, suggesting right atrial pressure of 8 mmHg. FINDINGS  Left Ventricle: Left ventricular ejection fraction, by estimation, is 60 to 65%. The left ventricle has normal function. The left ventricle has no regional wall motion abnormalities. The average left ventricular global longitudinal strain is -16.7 %. The global longitudinal strain is normal. The left ventricular internal cavity size was normal in size. There is borderline left ventricular hypertrophy. Left ventricular diastolic parameters were normal. Right Ventricle: The right ventricular size is normal. No increase in right ventricular wall thickness. Right ventricular systolic function is normal. Tricuspid regurgitation signal is inadequate for assessing PA pressure.  Left Atrium: Left atrial size was mildly dilated. Right Atrium: Right atrial size was normal in size. Pericardium: There is no evidence of pericardial effusion. Mitral Valve: The mitral valve is normal in structure. No evidence of mitral valve regurgitation. No evidence of mitral valve stenosis. MV peak gradient, 4.8 mmHg. The mean mitral valve gradient is 2.0 mmHg. Tricuspid Valve: The tricuspid valve is normal in structure. Tricuspid valve regurgitation is not demonstrated. No evidence of tricuspid stenosis. Aortic Valve: The aortic valve is normal in structure. Aortic valve regurgitation is not visualized. Aortic valve sclerosis is present, with no evidence of aortic valve stenosis. Aortic valve mean gradient measures 4.0 mmHg. Aortic valve peak  gradient measures 7.2 mmHg. Aortic valve area, by VTI measures 3.23 cm. Pulmonic Valve: The pulmonic valve was normal in structure. Pulmonic valve regurgitation is not visualized. No evidence of pulmonic stenosis. Aorta: The aortic root is normal in size and structure. Venous: The inferior vena cava is normal in size with less than 50% respiratory variability, suggesting right atrial pressure of 8 mmHg. IAS/Shunts: No atrial level shunt detected by color flow Doppler.  LEFT VENTRICLE PLAX 2D LVIDd:         4.70 cm   Diastology LVIDs:         3.10 cm   LV e' medial:    7.18 cm/s LV PW:         1.10 cm   LV E/e' medial:  13.9 LV IVS:        0.95 cm   LV e' lateral:   11.90 cm/s LVOT diam:     2.00 cm   LV E/e' lateral: 8.4 LV SV:         80 LV SV Index:   47        2D Longitudinal Strain LVOT Area:     3.14 cm  2D Strain GLS Avg:     -16.7 %                           3D Volume EF:                          3D EF:        70 %                          LV EDV:       235 ml                          LV ESV:       71 ml                          LV SV:        165 ml RIGHT VENTRICLE RV Basal diam:  3.30 cm LEFT ATRIUM             Index        RIGHT ATRIUM           Index LA diam:         4.30 cm 2.49 cm/m   RA Area:     12.10 cm LA Vol (A2C):   94.8 ml 54.82 ml/m  RA Volume:   24.60 ml  14.22 ml/m LA Vol (A4C):   74.0 ml 42.79 ml/m LA Biplane Vol: 85.0 ml 49.15 ml/m  AORTIC VALVE                    PULMONIC VALVE AV Area (Vmax):    2.98 cm     PV Vmax:        1.05 m/s AV Area (Vmean):   3.16 cm     PV Vmean:       74.200 cm/s AV Area (VTI):     3.23 cm     PV VTI:         0.252 m AV Vmax:           134.00 cm/s  PV Peak grad:   4.4  mmHg AV Vmean:          92.750 cm/s  PV Mean grad:   2.0 mmHg AV VTI:            0.249 m      RVOT Peak grad: 7 mmHg AV Peak Grad:      7.2 mmHg AV Mean Grad:      4.0 mmHg LVOT Vmax:         127.00 cm/s LVOT Vmean:        93.300 cm/s LVOT VTI:          0.256 m LVOT/AV VTI ratio: 1.03  AORTA Ao Root diam: 3.10 cm MITRAL VALVE                TRICUSPID VALVE MV Area (PHT): 3.36 cm     TR Peak grad:   13.4 mmHg MV Area VTI:   3.34 cm     TR Vmax:        183.00 cm/s MV Peak grad:  4.8 mmHg MV Mean grad:  2.0 mmHg     SHUNTS MV Vmax:       1.10 m/s     Systemic VTI:  0.26 m MV Vmean:      61.9 cm/s    Systemic Diam: 2.00 cm MV Decel Time: 226 msec     Pulmonic VTI:  0.272 m MV E velocity: 100.00 cm/s MV A velocity: 105.00 cm/s MV E/A ratio:  0.95 Kathlyn Sacramento MD Electronically signed by Kathlyn Sacramento MD Signature Date/Time: 11/07/2021/1:27:03 PM    Final    CT CHEST ABDOMEN PELVIS WO CONTRAST  Result Date: 11/09/2021 CLINICAL DATA:  Metastatic breast cancer restaging EXAM: CT CHEST, ABDOMEN AND PELVIS WITHOUT CONTRAST TECHNIQUE: Multidetector CT imaging of the chest, abdomen and pelvis was performed following the standard protocol without IV contrast. COMPARISON:  CT chest, 08/30/2021, CT abdomen pelvis, 08/04/2021, PET-CT, 03/21/2021, CT chest abdomen pelvis, 03/19/2021 FINDINGS: CT CHEST FINDINGS Cardiovascular: Left chest port catheter. Aortic atherosclerosis. Normal heart size. No pericardial effusion. Mediastinum/Nodes: No enlarged mediastinal,  hilar, or axillary lymph nodes. Thyroid gland, trachea, and esophagus demonstrate no significant findings. Lungs/Pleura: No significant change in minimal paramedian radiation fibrosis of the bilateral upper lobes (series 4, image 27) as well as the anterior right upper and right middle lobes (series 4, image 53). No pleural effusion or pneumothorax. Musculoskeletal: No chest wall mass. Status post right axillary lymph node dissection. CT ABDOMEN PELVIS FINDINGS Hepatobiliary: No solid liver abnormality is seen. No gallstones, gallbladder wall thickening, or biliary dilatation. Pancreas: Unremarkable. No pancreatic ductal dilatation or surrounding inflammatory changes. Spleen: Normal in size without significant abnormality. Adrenals/Urinary Tract: Adrenal glands are unremarkable. Kidneys are normal, without renal calculi, solid lesion, or hydronephrosis. Bladder is unremarkable. Stomach/Bowel: Stomach is within normal limits. Appendix is not clearly visualized and may be surgically absent. No evidence of bowel wall thickening, distention, or inflammatory changes. Large burden of stool throughout the colon and rectum. Vascular/Lymphatic: Aortic atherosclerosis. No significant change in enlarged bilateral inguinal, iliac, and pelvic sidewall lymph nodes, largest left inguinal node measuring 2.2 x 1.7 cm (series 2, image 109). Reproductive: No mass or other abnormality. Other: No abdominal wall hernia or abnormality. No abdominopelvic ascites. Musculoskeletal: Unchanged post treatment appearance of widespread sclerotic osseous metastatic disease. IMPRESSION: 1. Unchanged post treatment appearance of widespread sclerotic osseous metastatic disease. 2. Unchanged enlarged bilateral inguinal, iliac, and pelvic sidewall lymph nodes, which remain highly suspicious for metastatic disease. As on prior examination, lymphoma remains  a differential consideration. 3. No noncontrast evidence of new metastatic disease in the chest,  abdomen, or pelvis. Please note that noncontrast CT is significantly limited for the detection of solid organ metastatic disease. Aortic Atherosclerosis (ICD10-I70.0). Electronically Signed   By: Delanna Ahmadi M.D.   On: 11/09/2021 08:50     Assessment and plan- Patient is a 75 y.o. female with metastatic weakly ER positive HER2 positive breast cancer with liver lymph node and bone metastases.  She is here for on treatment assessment prior to cycle 7-day 1 of Taxol Herceptin and Perjeta  Recent scans which were compared to scans 3 months prior overall shows stable disease in her bones and lymph nodes.  There is a possible new focus in her right femur head for which I will refer her to radiation oncology.  Patient will continue to receive Herceptin and Perjeta every 3 weeks.  She has been on Taxol for roughly 6 months so far.  Given her worsening neuropathy I will bring it down to 2 weeks on and 1 week of and if it continues to be a problem I will discontinue Taxol altogether in the next few months.  I have also asked her to increase her gabapentin to 2 times a day for her neuropathy.Recent echocardiogram was also normal.  Patient continues to complain of a feeling where her skin crawls over her face and she is unable to see.  On physical exam I do not see any evidence of skin rash over her scalp.  I will obtain an MRI brain with and without contrast given that she has HER2 positive disease and tactile hallucinations.  If it is negative I would like to refer her to psychiatry due to ongoing symptoms.  Patient also will be seeing dermatology for her chronic onychomycosis involving her toes.  Taxol Herceptin and Perjeta today.  Taxol alone along with Zometa in 1 week.  See covering NP in 3 weeks for Taxol Herceptin and Perjeta     Visit Diagnosis 1. Metastatic breast cancer ([place])   2. Encounter for antineoplastic chemotherapy   3. Goals of care, counseling/discussion   4. Encounter for monitoring  zoledronic acid therapy   5. Chemotherapy-induced peripheral neuropathy ([place])      Dr. Randa Evens, MD, MPH [facility] at [facility] [phone] 11/11/2021 3:37 PM

## 2021-11-11 NOTE — Patient Instructions (Signed)
[facility]  Discharge Instructions: Thank you for choosing [facility] to provide your oncology and hematology care.  If you have a lab appointment with the [facility], please go directly to the [facility] and check in at the registration area.  Wear comfortable clothing and clothing appropriate for easy access to any Portacath or PICC line.   We strive to give you quality time with your provider. You may need to reschedule your appointment if you arrive late (15 or more minutes).  Arriving late affects you and other patients whose appointments are after yours.  Also, if you miss three or more appointments without notifying the office, you may be dismissed from the clinic at the provider's discretion.      For prescription refill requests, have your pharmacy contact our office and allow 72 hours for refills to be completed.    Today you received the following chemotherapy and/or immunotherapy agents : Herceptin / Perjeta / Taxol    To help prevent nausea and vomiting after your treatment, we encourage you to take your nausea medication as directed.  BELOW ARE SYMPTOMS THAT SHOULD BE REPORTED IMMEDIATELY: *FEVER GREATER THAN 100.4 F (38 C) OR HIGHER *CHILLS OR SWEATING *NAUSEA AND VOMITING THAT IS NOT CONTROLLED WITH YOUR NAUSEA MEDICATION *UNUSUAL SHORTNESS OF BREATH *UNUSUAL BRUISING OR BLEEDING *URINARY PROBLEMS (pain or burning when urinating, or frequent urination) *BOWEL PROBLEMS (unusual diarrhea, constipation, pain near the anus) TENDERNESS IN MOUTH AND THROAT WITH OR WITHOUT PRESENCE OF ULCERS (sore throat, sores in mouth, or a toothache) UNUSUAL RASH, SWELLING OR PAIN  UNUSUAL VAGINAL DISCHARGE OR ITCHING   Items with * indicate a potential emergency and should be followed up as soon as possible or go to the Emergency Department if any problems should occur.  Please show the CHEMOTHERAPY ALERT CARD or IMMUNOTHERAPY ALERT  CARD at check-in to the Emergency Department and triage nurse.  Should you have questions after your visit or need to cancel or reschedule your appointment, please contact [facility] [place] [facility] [place]  [phone] and follow the prompts.  Office hours are 8:00 a.m. to 4:30 p.m. Monday - Friday. Please note that voicemails left after 4:00 p.m. may not be returned until the following business day.  We are closed weekends and major holidays. You have access to a nurse at all times for urgent questions. Please call the main number to the clinic [phone] and follow the prompts.  For any non-urgent questions, you may also contact your provider using MyChart. We now offer e-Visits for anyone 66 and older to request care online for non-urgent symptoms. For details visit [email or web address].   Also download the MyChart app! Go to the app store, search "MyChart", open the app, select [facility], and log in with your MyChart username and password.  Due to Covid, a mask is required upon entering the hospital/clinic. If you do not have a mask, one will be given to you upon arrival. For doctor visits, patients may have 1 support person aged 54 or older with them. For treatment visits, patients cannot have anyone with them due to current Covid guidelines and our immunocompromised population.

## 2021-11-14 ENCOUNTER — Other Ambulatory Visit: Payer: Self-pay

## 2021-11-14 DIAGNOSIS — F988 Other specified behavioral and emotional disorders with onset usually occurring in childhood and adolescence: Secondary | ICD-10-CM

## 2021-11-14 DIAGNOSIS — R443 Hallucinations, unspecified: Secondary | ICD-10-CM

## 2021-11-15 ENCOUNTER — Ambulatory Visit
Admission: RE | Admit: 2021-11-15 | Discharge: 2021-11-15 | Disposition: A | Discharge status: Home / Self Care | Payer: Medicare Other | Source: Ambulatory Visit | Attending: Radiation Oncology | Admitting: Radiation Oncology

## 2021-11-15 ENCOUNTER — Other Ambulatory Visit: Payer: Self-pay | Admitting: *Deleted

## 2021-11-15 ENCOUNTER — Other Ambulatory Visit: Payer: Self-pay

## 2021-11-15 ENCOUNTER — Encounter: Payer: Self-pay | Admitting: *Deleted

## 2021-11-15 ENCOUNTER — Encounter: Payer: Self-pay | Admitting: Radiation Oncology

## 2021-11-15 VITALS — BP 144/89 | HR 109 | Temp 98.2°F | Wt 155.1 lb

## 2021-11-15 DIAGNOSIS — C7951 Secondary malignant neoplasm of bone: Secondary | ICD-10-CM | POA: Insufficient documentation

## 2021-11-15 DIAGNOSIS — Z809 Family history of malignant neoplasm, unspecified: Secondary | ICD-10-CM | POA: Insufficient documentation

## 2021-11-15 DIAGNOSIS — M8580 Other specified disorders of bone density and structure, unspecified site: Secondary | ICD-10-CM | POA: Diagnosis not present

## 2021-11-15 DIAGNOSIS — C7800 Secondary malignant neoplasm of unspecified lung: Secondary | ICD-10-CM | POA: Diagnosis not present

## 2021-11-15 DIAGNOSIS — C50919 Malignant neoplasm of unspecified site of unspecified female breast: Secondary | ICD-10-CM | POA: Insufficient documentation

## 2021-11-15 DIAGNOSIS — K219 Gastro-esophageal reflux disease without esophagitis: Secondary | ICD-10-CM | POA: Insufficient documentation

## 2021-11-15 DIAGNOSIS — I10 Essential (primary) hypertension: Secondary | ICD-10-CM | POA: Diagnosis not present

## 2021-11-15 DIAGNOSIS — Z800 Family history of malignant neoplasm of digestive organs: Secondary | ICD-10-CM | POA: Diagnosis not present

## 2021-11-15 DIAGNOSIS — F909 Attention-deficit hyperactivity disorder, unspecified type: Secondary | ICD-10-CM | POA: Diagnosis not present

## 2021-11-15 DIAGNOSIS — Z86718 Personal history of other venous thrombosis and embolism: Secondary | ICD-10-CM | POA: Insufficient documentation

## 2021-11-15 DIAGNOSIS — Z7901 Long term (current) use of anticoagulants: Secondary | ICD-10-CM | POA: Insufficient documentation

## 2021-11-15 DIAGNOSIS — Z8042 Family history of malignant neoplasm of prostate: Secondary | ICD-10-CM | POA: Insufficient documentation

## 2021-11-15 DIAGNOSIS — C787 Secondary malignant neoplasm of liver and intrahepatic bile duct: Secondary | ICD-10-CM | POA: Insufficient documentation

## 2021-11-15 DIAGNOSIS — Z79899 Other long term (current) drug therapy: Secondary | ICD-10-CM | POA: Insufficient documentation

## 2021-11-15 DIAGNOSIS — N2581 Secondary hyperparathyroidism of renal origin: Secondary | ICD-10-CM | POA: Diagnosis not present

## 2021-11-15 MED ORDER — AMPHETAMINE-DEXTROAMPHET ER 30 MG PO CP24: 30.0000 mg | ORAL_CAPSULE | Freq: Every day | ORAL | 0 refills | Status: DC

## 2021-11-15 MED ORDER — FENTANYL 12 MCG/HR TD PT72: 1.0000 | MEDICATED_PATCH | TRANSDERMAL | 0 refills | Status: DC

## 2021-11-15 NOTE — Consult Note (Signed)
NEW PATIENT EVALUATION  Name: Laura Santiago  MRN: [id number]  Date:   11/15/2021     DOB: 03-10-1946   This 75 y.o. 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  REFERRING PHYSICIAN: [facility]*  CHIEF COMPLAINT:  Chief Complaint  Patient presents with   Cancer    Initial consultation    DIAGNOSIS: The encounter diagnosis was Bone metastasis ([place]).   PREVIOUS INVESTIGATIONS:  Bone scan CT and MRI of brain scans reviewed Clinical notes reviewed  HPI: Patient is a 75 year old female moved here from [name] where Dr. 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  She presented in 2014 with a 2.1 cm mass in her right breast weakly ER positive PR negative and HER2/neu 3+.  She received neoadjuvant chemotherapy with Taxol Herceptin and Perjeta followed by dose dense AC Herceptin x4 completed in March 2015.  She had a lumpectomy and postoperative radiation therapy.  In 2020 she presented with neck pain found to have lytic lesions involving C5-6 and pathologic vertebral fractures underwent radiation treatments to this area.  L1 vertebral body biopsy showed metastatic carcinoma consistent with breast origin.  She is currently on oxycodone.  She is also been on Zometa in [place].  She has lung lesions as well as liver lesions and bone metastasis.  She is currently saving Taxol Herceptin Perjeta every 3 weeks.  Recent bone scan showed a new lesion of the right femoral head consistent with malignancy and I been asked to evaluate her for possible palliative radiation therapy to this area.  She is ambulating well she states she is not having any significant pain.  Patient does have a service dog present at all times.  PLANNED TREATMENT REGIMEN: Single fraction  palliative radiation therapy to right hip  PAST MEDICAL HISTORY:  has a past medical history of ADHD (attention deficit hyperactivity disorder), Anemia, Arthritis, Breast cancer ([place]) (10/05/2013), Cancer related pain (03/28/2021), DVT of lower extremity, bilateral ([place]) (03/30/2014), Generalized muscle weakness (03/31/2016), GERD (gastroesophageal reflux disease) (05/15/2013), Hyperparathyroidism, secondary ([place]) (04/25/2018), Hypertension (02/20/2016), Lumbar compression fracture ([place]) (03/18/2021), Memory loss (57/90/3833), Metabolic acidosis (38/32/9191), Metabolic syndrome (66/05/44), Osteopenia after menopause (03/29/2016), Pseudophakia of both eyes (09/30/2012), 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    PAST SURGICAL HISTORY:  Past Surgical History:  Procedure Laterality Date   CESAREAN SECTION     unknown   fibroid removed  N/A    in [place] - unknown date   IR FLUORO GUIDE CV LINE LEFT  07/27/2021   IR PORT REPAIR CENTRAL VENOUS ACCESS DEVICE Left    In [employer] N/A 02/25/2014   in [name]   ovary removed      unknown   [place] CATARACT EXTRACAP,INSERT LENS Bilateral  Bilateral 09/05/2012   in [place]     unknown    LUMPECTOMY Right 04/03/2014   in [place]: family history includes Cancer - Prostate in her brother; Diabetes in her father; Heart disease in her father; Hypertension in her father; Pancreatic cancer in her mother; Skin cancer in her brother and father; Stroke in her father; Varicose Veins in her father.  SOCIAL HISTORY:  reports that she has never smoked.  She has never used smokeless tobacco. She reports that she does not currently use alcohol. She reports that she does not use drugs.  ALLERGIES: Corticosteroids, Sulfa antibiotics, and Celebrex [celecoxib]  MEDICATIONS:  Current Outpatient Medications  Medication Sig Dispense Refill   acetaminophen (TYLENOL) 500 MG  tablet 1 tablet as needed     Calcium 200 MG TABS Take 1 tablet by mouth daily.     cephALEXin (KEFLEX) 500 MG capsule Take 500 mg by mouth every 6 (six) hours.     gabapentin (NEURONTIN) 400 MG capsule 1 capsule     gentamicin cream (GARAMYCIN) 0.1 % Apply topically daily.     lisinopril (ZESTRIL) 20 MG tablet 1 tablet     LORazepam (ATIVAN) 0.5 MG tablet Take 1 tablet (0.5 mg total) by mouth daily as needed for anxiety. 30 tablet 0   Multiple Vitamins-Minerals (MULTIVITAMIN ADULTS) TABS See admin instructions.     naloxone (NARCAN) nasal spray 4 mg/0.1 mL      nystatin (MYCOSTATIN) 100000 UNIT/ML suspension Take 5 mLs by mouth 4 (four) times daily.     Oxycodone HCl 10 MG TABS TAKE ONE TABLET BY MOUTH EVERY 4 HOURS AS NEEDED 84 tablet 0   potassium chloride SA (KLOR-CON) 20 MEQ tablet Take 1 tablet (20 mEq total) by mouth daily. 7 tablet 0   rivaroxaban (XARELTO) 20 MG TABS tablet Take 1 tablet (20 mg total) by mouth daily with supper. 30 tablet 3   SANTYL ointment Apply topically as directed.     terbinafine (LAMISIL AT) 1 % cream Apply 1 application topically 2 (two) times daily. 30 g 5   zolpidem (AMBIEN) 5 MG tablet Take 1 tablet (5 mg total) by mouth at bedtime. 30 tablet 0   amphetamine-dextroamphetamine (ADDERALL XR) 30 MG 24 hr capsule Take 1 capsule (30 mg total) by mouth daily. 30 capsule 0   cyclobenzaprine (FLEXERIL) 10 MG tablet Take by mouth. (Patient not taking: Reported on 09/30/2021)     fentaNYL (DURAGESIC) 12 MCG/HR Place 1 patch onto the skin every 3 (three) days. 1 patch to skin 10 patch 0   NARCAN 4 MG/0.1ML LIQD nasal spray kit SMARTSIG:1 Spray(s) Both Nares Once PRN (Patient not taking: Reported on 09/30/2021)     ondansetron (ZOFRAN-ODT) 8 MG disintegrating tablet 1 tablet on the tongue and allow to dissolve  as needed (Patient not taking: Reported on 09/30/2021)     No current facility-administered medications for this encounter.   Facility-Administered Medications  Ordered in Other Encounters  Medication Dose Route Frequency Provider Last Rate Last Admin   heparin lock flush 100 UNIT/ML injection            sodium chloride flush (NS) 0.9 % injection 10 mL  10 mL Intracatheter PRN [name], MD        ECOG PERFORMANCE STATUS:  0 - Asymptomatic  REVIEW OF SYSTEMS: Patient denies any weight loss, fatigue, weakness, fever, chills or night sweats. Patient denies any loss of vision, blurred vision. Patient denies any ringing  of the ears or hearing loss. No irregular heartbeat. Patient denies heart murmur or history of fainting. Patient denies any chest pain or pain radiating to her upper extremities. Patient denies any shortness of breath, difficulty breathing at night, cough or hemoptysis. Patient denies any swelling in the lower legs. Patient denies any nausea vomiting, vomiting of blood, or coffee ground material in the vomitus. Patient denies any stomach pain. Patient states has had normal bowel movements no significant  constipation or diarrhea. Patient denies any dysuria, hematuria or significant nocturia. Patient denies any problems walking, swelling in the joints or loss of balance. Patient denies any skin changes, loss of hair or loss of weight. Patient denies any excessive worrying or anxiety or significant depression. Patient denies any problems with insomnia. Patient denies excessive thirst, polyuria, polydipsia. Patient denies any swollen glands, patient denies easy bruising or easy bleeding. Patient denies any recent infections, allergies or URI. Patient "s visual fields have not changed significantly in recent time.   PHYSICAL EXAM: BP (!) 144/89   Pulse (!) 109   Temp 98.2 F (36.8 C) (Tympanic)   Wt 155 lb 1.6 oz (70.4 kg)   BMI 27.47 kg/m  Range of motion of the lower extremities does not elicit pain motor and sensory levels are equal and symmetric.  Well-developed well-nourished patient in NAD. HEENT reveals PERLA, EOMI, discs not  visualized.  Oral cavity is clear. No oral mucosal lesions are identified. Neck is clear without evidence of cervical or supraclavicular adenopathy. Lungs are clear to A&P. Cardiac examination is essentially unremarkable with regular rate and rhythm without murmur rub or thrill. Abdomen is benign with no organomegaly or masses noted. Motor sensory and DTR levels are equal and symmetric in the upper and lower extremities. Cranial nerves II through XII are grossly intact. Proprioception is intact. No peripheral adenopathy or edema is identified. No motor or sensory levels are noted. Crude visual fields are within normal range.  LABORATORY DATA: Labs reviewed    RADIOLOGY RESULTS: CT scans MRI scans and bone scan reviewed compatible with above-stated findings   IMPRESSION: Widespread metastatic disease from HER2/neu positive invasive breast cancer with new lesion in the right femoral head in 75 year old female  PLAN: This time of offering single fraction palliative radiation therapy to her right hip we will plan on delivering 800 cGy in 1 fraction.  Risks and benefits of treatment occluding possible fatigue and possible skin reaction were reviewed with the patient.  I set her up for simulation later this week.  Patient comprehends my recommendations well.  I would like to take this opportunity to thank you for allowing me to participate in the care of your patient.Noreene Filbert, MD

## 2021-11-16 ENCOUNTER — Encounter: Payer: Medicare Other | Attending: Internal Medicine | Admitting: Internal Medicine

## 2021-11-16 DIAGNOSIS — N184 Chronic kidney disease, stage 4 (severe): Secondary | ICD-10-CM | POA: Insufficient documentation

## 2021-11-16 DIAGNOSIS — Z8619 Personal history of other infectious and parasitic diseases: Secondary | ICD-10-CM | POA: Diagnosis not present

## 2021-11-16 DIAGNOSIS — C801 Malignant (primary) neoplasm, unspecified: Secondary | ICD-10-CM | POA: Insufficient documentation

## 2021-11-16 DIAGNOSIS — Z7901 Long term (current) use of anticoagulants: Secondary | ICD-10-CM | POA: Diagnosis not present

## 2021-11-16 DIAGNOSIS — C79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: Secondary | ICD-10-CM | POA: Insufficient documentation

## 2021-11-16 DIAGNOSIS — X58XXXD Exposure to other specified factors, subsequent encounter: Secondary | ICD-10-CM | POA: Diagnosis not present

## 2021-11-16 DIAGNOSIS — I129 Hypertensive chronic kidney disease with stage 1 through stage 4 chronic kidney disease, or unspecified chronic kidney disease: Secondary | ICD-10-CM | POA: Insufficient documentation

## 2021-11-16 DIAGNOSIS — I87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: Secondary | ICD-10-CM | POA: Insufficient documentation

## 2021-11-16 DIAGNOSIS — I872 Venous insufficiency (chronic) (peripheral): Secondary | ICD-10-CM | POA: Diagnosis not present

## 2021-11-16 DIAGNOSIS — L97822 Non-pressure chronic ulcer of other part of left lower leg with fat layer exposed: Secondary | ICD-10-CM | POA: Insufficient documentation

## 2021-11-16 DIAGNOSIS — S91101D Unspecified open wound of right great toe without damage to nail, subsequent encounter: Secondary | ICD-10-CM | POA: Diagnosis present

## 2021-11-16 DIAGNOSIS — Z86718 Personal history of other venous thrombosis and embolism: Secondary | ICD-10-CM | POA: Insufficient documentation

## 2021-11-17 ENCOUNTER — Other Ambulatory Visit: Payer: Self-pay | Admitting: Oncology

## 2021-11-17 ENCOUNTER — Ambulatory Visit
Admission: RE | Admit: 2021-11-17 | Discharge: 2021-11-17 | Disposition: A | Discharge status: Home / Self Care | Payer: Medicare Other | Source: Ambulatory Visit | Attending: Radiation Oncology | Admitting: Radiation Oncology

## 2021-11-17 DIAGNOSIS — C7951 Secondary malignant neoplasm of bone: Secondary | ICD-10-CM | POA: Insufficient documentation

## 2021-11-17 DIAGNOSIS — Z510 Encounter for antineoplastic radiation therapy: Secondary | ICD-10-CM | POA: Diagnosis not present

## 2021-11-18 ENCOUNTER — Other Ambulatory Visit: Payer: Self-pay

## 2021-11-18 ENCOUNTER — Inpatient Hospital Stay: Payer: Medicare Other

## 2021-11-18 VITALS — BP 123/74 | HR 77 | Temp 96.9°F | Resp 18

## 2021-11-18 DIAGNOSIS — C50919 Malignant neoplasm of unspecified site of unspecified female breast: Secondary | ICD-10-CM

## 2021-11-18 DIAGNOSIS — Z510 Encounter for antineoplastic radiation therapy: Secondary | ICD-10-CM | POA: Diagnosis not present

## 2021-11-18 DIAGNOSIS — Z5111 Encounter for antineoplastic chemotherapy: Secondary | ICD-10-CM | POA: Diagnosis not present

## 2021-11-18 LAB — CBC WITH DIFFERENTIAL/PLATELET
Abs Immature Granulocytes: 0.06 10*3/uL (ref 0.00–0.07)
Basophils Absolute: 0 10*3/uL (ref 0.0–0.1)
Basophils Relative: 0 %
Eosinophils Absolute: 0.2 10*3/uL (ref 0.0–0.5)
Eosinophils Relative: 3 %
HCT: 29.9 % — ABNORMAL LOW (ref 36.0–46.0)
Hemoglobin: 9.1 g/dL — ABNORMAL LOW (ref 12.0–15.0)
Immature Granulocytes: 1 %
Lymphocytes Relative: 12 %
Lymphs Abs: 0.7 10*3/uL (ref 0.7–4.0)
MCH: 28.3 pg (ref 26.0–34.0)
MCHC: 30.4 g/dL (ref 30.0–36.0)
MCV: 92.9 fL (ref 80.0–100.0)
Monocytes Absolute: 0.4 10*3/uL (ref 0.1–1.0)
Monocytes Relative: 7 %
Neutro Abs: 4.3 10*3/uL (ref 1.7–7.7)
Neutrophils Relative %: 77 %
Platelets: 256 10*3/uL (ref 150–400)
RBC: 3.22 MIL/uL — ABNORMAL LOW (ref 3.87–5.11)
RDW: 16.2 % — ABNORMAL HIGH (ref 11.5–15.5)
WBC: 5.7 10*3/uL (ref 4.0–10.5)
nRBC: 0 % (ref 0.0–0.2)

## 2021-11-18 LAB — COMPREHENSIVE METABOLIC PANEL
ALT: 11 U/L (ref 0–44)
AST: 19 U/L (ref 15–41)
Albumin: 3.5 g/dL (ref 3.5–5.0)
Alkaline Phosphatase: 60 U/L (ref 38–126)
Anion gap: 10 (ref 5–15)
BUN: 22 mg/dL (ref 8–23)
CO2: 27 mmol/L (ref 22–32)
Calcium: 8.8 mg/dL — ABNORMAL LOW (ref 8.9–10.3)
Chloride: 102 mmol/L (ref 98–111)
Creatinine, Ser: 1.26 mg/dL — ABNORMAL HIGH (ref 0.44–1.00)
GFR, Estimated: 44 mL/min — ABNORMAL LOW (ref >60)
Glucose, Bld: 120 mg/dL — ABNORMAL HIGH (ref 70–99)
Potassium: 4.2 mmol/L (ref 3.5–5.1)
Sodium: 139 mmol/L (ref 135–145)
Total Bilirubin: 0.3 mg/dL (ref 0.3–1.2)
Total Protein: 6.6 g/dL (ref 6.5–8.1)

## 2021-11-18 MED ORDER — DIPHENHYDRAMINE HCL 50 MG/ML IJ SOLN
50.0000 mg | Freq: Once | INTRAMUSCULAR | Status: AC
  Administered 2021-11-18: 50 mg via INTRAVENOUS
  Filled 2021-11-18: qty 1

## 2021-11-18 MED ORDER — HEPARIN SOD (PORK) LOCK FLUSH 100 UNIT/ML IV SOLN
500.0000 [IU] | Freq: Once | INTRAVENOUS | Status: AC | PRN
  Filled 2021-11-18: qty 5

## 2021-11-18 MED ORDER — SODIUM CHLORIDE 0.9% FLUSH
10.0000 mL | INTRAVENOUS | Status: DC | PRN
  Filled 2021-11-18: qty 10

## 2021-11-18 MED ORDER — FAMOTIDINE 20 MG IN NS 100 ML IVPB
20.0000 mg | Freq: Once | INTRAVENOUS | Status: AC
  Administered 2021-11-18: 20 mg via INTRAVENOUS
  Filled 2021-11-18: qty 20

## 2021-11-18 MED ORDER — SODIUM CHLORIDE 0.9 % IV SOLN
Freq: Once | INTRAVENOUS | Status: AC
  Filled 2021-11-18: qty 250

## 2021-11-18 MED ORDER — ZOLEDRONIC ACID 4 MG/5ML IV CONC
3.3000 mg | INTRAVENOUS | Status: DC
  Administered 2021-11-18: 3.3 mg via INTRAVENOUS
  Filled 2021-11-18: qty 4.13

## 2021-11-18 MED ORDER — HEPARIN SOD (PORK) LOCK FLUSH 100 UNIT/ML IV SOLN
INTRAVENOUS | Status: AC
  Administered 2021-11-18: 500 [IU]
  Filled 2021-11-18: qty 5

## 2021-11-18 MED ORDER — SODIUM CHLORIDE 0.9 % IV SOLN
10.0000 mg | Freq: Once | INTRAVENOUS | Status: AC
  Administered 2021-11-18: 10 mg via INTRAVENOUS
  Filled 2021-11-18: qty 10

## 2021-11-18 MED ORDER — SODIUM CHLORIDE 0.9 % IV SOLN
65.0000 mg/m2 | Freq: Once | INTRAVENOUS | Status: AC
  Administered 2021-11-18: 114 mg via INTRAVENOUS
  Filled 2021-11-18: qty 19

## 2021-11-18 NOTE — Progress Notes (Signed)
No blood return from port. Flushes easily, without pain or swelling. She had a dye study 07/27/21 "IMPRESSION: Patent left IJ power port catheter. Tip is short in the proximal SVC. Negative for fibrin sheath.treatment." She has intermittently had blood return from the port since that time. Per Beckey Rutter NP, ok to proceed with treatment today.

## 2021-11-21 NOTE — Progress Notes (Signed)
MARGO, LAMA ([id number]) Visit Report for 11/16/2021 Chief Complaint Document Details Patient Name: Laura Santiago, Laura Santiago. Date of Service: 11/16/2021 9:00 AM Medical Record Number: [id number] Patient Account Number: [id number] Date of Birth/Sex: 1946-10-20 (75 y.o. F) Treating RN: Levora Dredge Primary Care Provider: Priscille Kluver Other Clinician: Cornell Barman Referring Provider: Randa Evens Treating Provider/Extender: Yaakov Guthrie in Treatment: 19 Information Obtained from: Patient Chief Complaint Left lower extremity wound Right toe wounds Left upper lateral thigh wounds Electronic Signature(s) Signed: 11/16/2021 10:15:23 AM By: Kalman Shan DO Entered By: Kalman Shan on 11/16/2021 10:07:43 Laura Santiago ([id number]) -------------------------------------------------------------------------------- Debridement Details Patient Name: Laura Santiago. Date of Service: 11/16/2021 9:00 AM Medical Record Number: [id number] Patient Account Number: [id number] Date of Birth/Sex: [birth year] (75 y.o. F) Treating RN: Levora Dredge Primary Care Provider: Orvis Brill, DOCTORS Other Clinician: Cornell Barman Referring Provider: Randa Evens Treating Provider/Extender: Yaakov Guthrie in Treatment: 19 Debridement Performed for Wound #2 Right Toe Great Assessment: Performed By: Physician Kalman Shan, MD Debridement Type: Debridement Level of Consciousness (Pre- Awake and Alert procedure): Pre-procedure Verification/Time Out Yes - 09:37 Taken: Pain Control: Lidocaine Total Area Debrided (L x W): 0.1 (cm) x 0.2 (cm) = 0.02 (cm) Tissue and other material Viable, Non-Viable, Skin: Dermis debrided: Level: Skin/Dermis Debridement Description: Selective/Open Wound Instrument: Curette Bleeding: Minimum Hemostasis Achieved: Pressure Response to Treatment: Procedure was tolerated well Level of Consciousness (Post- Awake and Alert procedure): Post  Debridement Measurements of Total Wound Length: (cm) 0.3 Width: (cm) 0.4 Depth: (cm) 0.1 Volume: (cm) 0.009 Character of Wound/Ulcer Post Debridement: Stable Post Procedure Diagnosis Same as Pre-procedure Electronic Signature(s) Signed: 11/16/2021 10:15:23 AM By: Kalman Shan DO Signed: 11/21/2021 11:01:31 AM By: Levora Dredge Entered By: Levora Dredge on 11/16/2021 09:45:42 Laura Santiago ([id number]) -------------------------------------------------------------------------------- HPI Details Patient Name: Laura Santiago. Date of Service: 11/16/2021 9:00 AM Medical Record Number: [id number] Patient Account Number: [id number] Date of Birth/Sex: June 11, 1946 (75 y.o. F) Treating RN: Levora Dredge Primary Care Provider: Priscille Kluver Other Clinician: Cornell Barman Referring Provider: Randa Evens Treating Provider/Extender: Yaakov Guthrie in Treatment: 54 History of Present Illness HPI Description: Admission 7/27 Ms. Leota Maka is a 75 year old female with a past medical history of ADHD, metastatic breast cancer, stage IV chronic kidney disease, history of DVT on Xarelto and chronic venous insufficiency that presents to the clinic for a chronic left lower extremity wound. She recently moved to [facility] 4 days ago. She was being followed by wound care center in [name]. She reports a 10-year history of wounds to her left lower extremity that eventually do heal with debridement and compression therapy. She states that the current wound reopened 4 months ago and she is using Vaseline and Coban. She denies signs of infection. 8/3; patient presents for 1 week follow-up. She reports no issues or complaints today. She states she had vascular studies done in the last week. She denies signs of infection. She brought her little service dog with her today. 8/17; patient presents for follow-up. She has missed her last clinic appointment. She states she took  the wrap off and attempted to rewrap her leg. She is having difficulty with transportation. She has her service dog with her today. Overall she feels well and reports improvement in wound healing. She denies signs of infection. She reports owning an old Velcro wrap compression and has this at her living facility 9/14; patient presents for follow-up. Patient states that over the past 2 to 3 weeks she developed toe wounds to her  right foot. She attributes this to tight fitting shoes. She subsequently developed cellulitis in the right leg and has been treated by doxycycline by her oncologist. She reports improvement in symptoms however continues to have some redness and swelling to this leg. To the left lower extremity patient has been having her wraps changed with home health twice weekly. She states that the [facility] is not helping control the drainage. Other than that she has no issues or complaints today. She denies signs of infection to the left lower extremity. 9/21; patient presents for follow-up. She reports seeing infectious disease for her cellulitis. She reports no further management. She has home health that changes the wraps twice weekly. She has no issues or complaints today. She denies signs of infection. 10/5; patient presents for follow-up. She has no issues or complaints today. She denies signs of infection. She states that the right great toe has not been dressed by home health. 10/12; patient presents for follow-up. She has no issues or complaints today. She reports improvement in her wound healing. She has been using silver alginate to the right great toe wound. She denies signs of infection. 10/26; patient presents for follow-up. Home health did not have sorbact so they continued to use Hydrofera Blue under the wrap. She has been using silver alginate to the great toe wound however she did not have a dressing in place today. She currently denies signs of infection. 11/2;  patient presents for follow-up. She has been using sorb act under the compression wrap. She reports using silver alginate to the toe wound again she does not have a dressing in place. She currently denies signs of infection. 11/23; patient presents for follow-up. Unfortunately she has missed her last 2 clinic appointments. She was last seen 3 weeks ago. She did her own compression wrap with Kerlix and Coban yesterday after seeing vein and vascular. She has not been dressing her right great toe wound. She currently denies signs of infection. 11/30; patient presents for 1 week follow-up. She states she changed her dressing last week prior to home health and use sorb act with Dakin's and Hydrofera Blue. Home health has changed the dressing as well and they have been using sorbact. Today she reports increased redness to her right lower extremity. She has a history of cellulitis to this leg. She has been using silver alginate to the right great toe. Unfortunately she had an episode of diarrhea prior to coming in and had feces all over the right leg and to the wrap of her left leg. 12/7; patient presents for 1 week follow-up. She states that home health did not come out to change the dressing and she took it off yesterday. It is unclear if she is dressing the right toe wound. She denies signs of infection. Electronic Signature(s) Signed: 11/16/2021 10:15:23 AM By: Kalman Shan DO Entered By: Kalman Shan on 11/16/2021 10:08:42 Laura Santiago ([id number]) -------------------------------------------------------------------------------- Physical Exam Details Patient Name: LEIRA, REGINO. Date of Service: 11/16/2021 9:00 AM Medical Record Number: [id number] Patient Account Number: [id number] Date of Birth/Sex: 1946-07-10 (75 y.o. F) Treating RN: Levora Dredge Primary Care Provider: Priscille Kluver Other Clinician: Cornell Barman Referring Provider: Randa Evens Treating Provider/Extender:  Yaakov Guthrie in Treatment: 12 Constitutional . Cardiovascular . Psychiatric . Notes Left lower extremity: Large open wound to the medial aspect with nonviable tissue and granulation tissue present. Right foot: To the right great toe there is an open wound with granulation tissue and circumferential undermining Left  lateral thigh: 2 previous wound site there is epithelialization and a scab present. To the right lower extremity there is no longer increased erythema and warmth No signs of infection to any wound bed Electronic Signature(s) Signed: 11/16/2021 10:15:23 AM By: Kalman Shan DO Entered By: Kalman Shan on 11/16/2021 10:09:49 Laura Santiago ([id number]) -------------------------------------------------------------------------------- Physician Orders Details Patient Name: Laura Santiago. Date of Service: 11/16/2021 9:00 AM Medical Record Number: [id number] Patient Account Number: [id number] Date of Birth/Sex: May 10, 1946 (75 y.o. F) Treating RN: Levora Dredge Primary Care Provider: Priscille Kluver Other Clinician: Cornell Barman Referring Provider: Randa Evens Treating Provider/Extender: Yaakov Guthrie in Treatment: 76 Verbal / Phone Orders: No Diagnosis Coding Follow-up Appointments o Return Appointment in 1 week. o Nurse Visit as needed [place] for wound care. May utilize formulary equivalent dressing for wound treatment orders unless otherwise specified. Home Health Nurse may visit PRN to address patientos wound care needs. o Scheduled days for dressing changes to be completed; exception, patient has scheduled wound care visit that day. - Twice a week, patient comes to office on Wednesday o **Please direct any NON-WOUND related issues/requests for orders to patient's Primary Care Physician. **If current dressing causes regression in wound condition, may D/C ordered  dressing product/s and apply Normal Saline Moist Dressing daily until next [place] or Other MD appointment. **Notify [facility]aling Center of regression in wound condition at [phone]. Bathing/ Shower/ Hygiene o May shower with wound dressing protected with water repellent cover or cast protector. o No tub bath. Edema Control - Lymphedema / Segmental Compressive Device / Other o Elevate leg(s) parallel to the floor when sitting. o DO YOUR BEST to sleep in the bed at night. DO NOT sleep in your recliner. Long hours of sitting in a recliner leads to swelling of the legs and/or potential wounds on your backside. Additional Orders / Instructions o Follow Nutritious Diet and Increase Protein Intake Medications-Please add to medication list. o P.O. Antibiotics - Pick up and take oral antibiotics for your right leg Wound Treatment Wound #1 - Lower Leg Wound Laterality: Left, Medial Cleanser: Soap and Water (Home Health) 3 x Per Week/30 Days Discharge Instructions: Gently cleanse wound with antibacterial soap, rinse and pat dry prior to dressing wounds Peri-Wound Care: Desitin Maximum Strength Ointment 4 (oz) (Home Health) 3 x Per Week/30 Days Discharge Instructions: Apply periwound Primary Dressing: Cutimed Sorbact 1.5x 2.38 (in/in) 3 x Per Week/30 Days Discharge Instructions: DAKINS MOISTENED- Secondary Dressing: Xtrasorb Large 6x9 (in/in) (Home Health) 3 x Per Week/30 Days Discharge Instructions: Apply to wound as directed. Do not cut. Compression Wrap: Medichoice 4 layer Compression System, 35-40 mmHG (Home Health) 3 x Per Week/30 Days Discharge Instructions: Apply multi-layer wrap as directed. Wound #2 - Toe Great Wound Laterality: Right Cleanser: Wound Cleanser (Home Health) 3 x Per Week/30 Days Discharge Instructions: Wash your hands with soap and water. Remove old dressing, discard into plastic bag and place into trash. Cleanse the wound with Wound Cleanser  prior to applying a clean dressing using gauze sponges, not tissues or cotton balls. Do not scrub or use excessive force. Pat dry using gauze sponges, not tissue or cotton balls. Primary Dressing: Silvercel Small 2x2 (in/in) (Home Health) 3 x Per Week/30 Days Discharge Instructions: Apply Silvercel Small 2x2 (in/in) as instructed BELLADONNA, LUBINSKI ([id number]) Secondary Dressing: Gauze ([place]) 3 x Per Week/30 Days Discharge Instructions: Cover with dry gauze Secured With: 87M Medipore  H Soft Cloth Surgical Tape, 2x2 (in/yd) (Home Health) 3 x Per Week/30 Days Discharge Instructions: Secure dressing Electronic Signature(s) Signed: 11/16/2021 10:15:23 AM By: Kalman Shan DO Entered By: Kalman Shan on 11/16/2021 10:14:51 Laura Santiago ([id number]) -------------------------------------------------------------------------------- Problem List Details Patient Name: ITATI, BROCKSMITH. Date of Service: 11/16/2021 9:00 AM Medical Record Number: [id number] Patient Account Number: [id number] Date of Birth/Sex: 1946/06/12 (75 y.o. F) Treating RN: Levora Dredge Primary Care Provider: Orvis Brill, DOCTORS Other Clinician: Cornell Barman Referring Provider: Randa Evens Treating Provider/Extender: Yaakov Guthrie in Treatment: 19 Active Problems ICD-10 Encounter Code Description Active Date MDM Diagnosis L97.822 Non-pressure chronic ulcer of other part of left lower leg with fat layer 11/02/2021 No Yes exposed I87.312 Chronic venous hypertension (idiopathic) with ulcer of left lower 11/02/2021 No Yes extremity S91.101D Unspecified open wound of right great toe without damage to nail, 08/31/2021 No Yes subsequent encounter I87.2 Venous insufficiency (chronic) (peripheral) 07/06/2021 No Yes Z79.01 Long term (current) use of anticoagulants 07/06/2021 No Yes I10 Essential (primary) hypertension 07/06/2021 No Yes 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 07/06/2021 No Yes Inactive  Problems ICD-10 Code Description Active Date Inactive Date S81.802A Unspecified open wound, left lower leg, initial encounter 07/06/2021 07/06/2021 S91.101A Unspecified open wound of right great toe without damage to nail, initial 08/24/2021 08/24/2021 encounter S91.104A Unspecified open wound of right lesser toe(s) without damage to nail, initial 08/24/2021 08/24/2021 encounter Resolved Problems ICD-10 NYIA, TSAO ([id number]) Code Description Active Date Resolved Date S91.104D Unspecified open wound of right lesser toe(s) without damage to nail, 08/31/2021 08/31/2021 subsequent encounter Electronic Signature(s) Signed: 11/16/2021 10:15:23 AM By: Kalman Shan DO Entered By: Kalman Shan on 11/16/2021 10:07:35 Laura Santiago ([id number]) -------------------------------------------------------------------------------- Progress Note Details Patient Name: Laura Santiago. Date of Service: 11/16/2021 9:00 AM Medical Record Number: [id number] Patient Account Number: [id number] Date of Birth/Sex: 03/26/1946 (75 y.o. F) Treating RN: Levora Dredge Primary Care Provider: Priscille Kluver Other Clinician: Cornell Barman Referring Provider: Randa Evens Treating Provider/Extender: Yaakov Guthrie in Treatment: 19 Subjective Chief Complaint Information obtained from Patient Left lower extremity wound Right toe wounds Left upper lateral thigh wounds History of Present Illness (HPI) Admission 7/27 Ms. Akya Fiorello is a 75 year old female with a past medical history of ADHD, metastatic breast cancer, stage IV chronic kidney disease, history of DVT on Xarelto and chronic venous insufficiency that presents to the clinic for a chronic left lower extremity wound. She recently moved to [facility] 4 days ago. She was being followed by wound care center in [name]. She reports a 10-year history of wounds to her left lower extremity that eventually do heal with  debridement and compression therapy. She states that the current wound reopened 4 months ago and she is using Vaseline and Coban. She denies signs of infection. 8/3; patient presents for 1 week follow-up. She reports no issues or complaints today. She states she had vascular studies done in the last week. She denies signs of infection. She brought her little service dog with her today. 8/17; patient presents for follow-up. She has missed her last clinic appointment. She states she took the wrap off and attempted to rewrap her leg. She is having difficulty with transportation. She has her service dog with her today. Overall she feels well and reports improvement in wound healing. She denies signs of infection. She reports owning an old Velcro wrap compression and has this at her living facility 9/14; patient presents for follow-up. Patient states that over the past 2 to 3  weeks she developed toe wounds to her right foot. She attributes this to tight fitting shoes. She subsequently developed cellulitis in the right leg and has been treated by doxycycline by her oncologist. She reports improvement in symptoms however continues to have some redness and swelling to this leg. To the left lower extremity patient has been having her wraps changed with home health twice weekly. She states that the [facility] is not helping control the drainage. Other than that she has no issues or complaints today. She denies signs of infection to the left lower extremity. 9/21; patient presents for follow-up. She reports seeing infectious disease for her cellulitis. She reports no further management. She has home health that changes the wraps twice weekly. She has no issues or complaints today. She denies signs of infection. 10/5; patient presents for follow-up. She has no issues or complaints today. She denies signs of infection. She states that the right great toe has not been dressed by home health. 10/12; patient  presents for follow-up. She has no issues or complaints today. She reports improvement in her wound healing. She has been using silver alginate to the right great toe wound. She denies signs of infection. 10/26; patient presents for follow-up. Home health did not have sorbact so they continued to use Hydrofera Blue under the wrap. She has been using silver alginate to the great toe wound however she did not have a dressing in place today. She currently denies signs of infection. 11/2; patient presents for follow-up. She has been using sorb act under the compression wrap. She reports using silver alginate to the toe wound again she does not have a dressing in place. She currently denies signs of infection. 11/23; patient presents for follow-up. Unfortunately she has missed her last 2 clinic appointments. She was last seen 3 weeks ago. She did her own compression wrap with Kerlix and Coban yesterday after seeing vein and vascular. She has not been dressing her right great toe wound. She currently denies signs of infection. 11/30; patient presents for 1 week follow-up. She states she changed her dressing last week prior to home health and use sorb act with Dakin's and Hydrofera Blue. Home health has changed the dressing as well and they have been using sorbact. Today she reports increased redness to her right lower extremity. She has a history of cellulitis to this leg. She has been using silver alginate to the right great toe. Unfortunately she had an episode of diarrhea prior to coming in and had feces all over the right leg and to the wrap of her left leg. 12/7; patient presents for 1 week follow-up. She states that home health did not come out to change the dressing and she took it off yesterday. It is unclear if she is dressing the right toe wound. She denies signs of infection. Objective Constitutional APARNA, VANDERWEELE. ([id number]) Vitals Time Taken: 8:45 AM, Height: 66 in, Weight: 153 lbs,  BMI: 24.7, Temperature: 97.5 F, Pulse: 95 bpm, Respiratory Rate: 18 breaths/min, Blood Pressure: 126/69 mmHg. General Notes: Left lower extremity: Large open wound to the medial aspect with nonviable tissue and granulation tissue present. Right foot: To the right great toe there is an open wound with granulation tissue and circumferential undermining Left lateral thigh: 2 previous wound site there is epithelialization and a scab present. To the right lower extremity there is no longer increased erythema and warmth No signs of infection to any wound bed Integumentary (Hair, Skin) Wound #1 status is  Open. Original cause of wound was Gradually Appeared. The date acquired was: 04/06/2021. The wound has been in treatment 19 weeks. The wound is located on the Left,Medial Lower Leg. The wound measures 9.6cm length x 6.1cm width x 0.3cm depth; 45.993cm^2 area and 13.798cm^3 volume. There is Fat Layer (Subcutaneous Tissue) exposed. There is a medium amount of serosanguineous drainage noted. There is large (67-100%) red, pink granulation within the wound bed. There is a small (1-33%) amount of necrotic tissue within the wound bed including Adherent Slough. Wound #2 status is Open. Original cause of wound was Gradually Appeared. The date acquired was: 08/24/2021. The wound has been in treatment 12 weeks. The wound is located on the Right Toe Great. The wound measures 0.1cm length x 0.2cm width x 0.1cm depth; 0.016cm^2 area and 0.002cm^3 volume. There is Fat Layer (Subcutaneous Tissue) exposed. There is a medium amount of serous drainage noted. The wound margin is thickened. There is large (67-100%) red granulation within the wound bed. There is no necrotic tissue within the wound bed. Wound #5 status is Healed - Epithelialized. Original cause of wound was Trauma. The date acquired was: 10/10/2021. The wound has been in treatment 2 weeks. The wound is located on the Left,Lateral Lower Leg. The wound measures  0cm length x 0cm width x 0cm depth; 0cm^2 area and 0cm^3 volume. There is Fat Layer (Subcutaneous Tissue) exposed. There is no tunneling or undermining noted. There is a none present amount of drainage noted. There is small (1-33%) pink granulation within the wound bed. There is a large (67-100%) amount of necrotic tissue within the wound bed including Eschar. Assessment Active Problems ICD-10 Non-pressure chronic ulcer of other part of left lower leg with fat layer exposed Chronic venous hypertension (idiopathic) with ulcer of left lower extremity Unspecified open wound of right great toe without damage to nail, subsequent encounter Venous insufficiency (chronic) (peripheral) Long term (current) use of anticoagulants Essential (primary) hypertension Secondary malignant neoplasm of breast Patient's wound is stable. I debrided nonviable tissue to the right great toe wound. I recommended continuing sorbact with Dakin's solution to the left lower extremity. She can continue silver alginate to the right toe wound. She completed her antibiotics and there is no evidence of cellulitis present to the right leg. Her poor wound healing is likely due to chemotherapy for her metastatic breast cancer. 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 There was a Selective/Open Wound Skin/Dermis Debridement with a total area of 0.02 sq cm performed by Kalman Shan, MD. With the following instrument(s): Curette to remove Viable and Non-Viable tissue/material. Material removed includes Skin: Dermis after achieving pain control using Lidocaine. A time out was conducted at 09:37, prior to the start of the procedure. A Minimum amount of bleeding was controlled with Pressure. The procedure was tolerated well. Post Debridement Measurements: 0.3cm length x 0.4cm width x 0.1cm depth; 0.009cm^3 volume. Character of Wound/Ulcer Post  Debridement is stable. Post procedure Diagnosis Wound #2: Same as Pre-Procedure Plan Follow-up Appointments: Return Appointment in 1 week. Nurse Visit as needed ALANEE, TING ([id number]) Home Health: [facility] for wound care. May utilize formulary equivalent dressing for wound treatment orders unless otherwise specified. Home Health Nurse may visit PRN to address patient s wound care needs. Scheduled days for dressing changes to be completed; exception, patient has scheduled wound care visit that day. - Twice a week, patient comes  to office on Wednesday **Please direct any NON-WOUND related issues/requests for orders to patient's Primary Care Physician. **If current dressing causes regression in wound condition, may D/C ordered dressing product/s and apply Normal Saline Moist Dressing daily until next [place] or Other MD appointment. **Notify Wound Healing Center of regression in wound condition at [phone]. Bathing/ Shower/ Hygiene: May shower with wound dressing protected with water repellent cover or cast protector. No tub bath. Edema Control - Lymphedema / Segmental Compressive Device / Other: Elevate leg(s) parallel to the floor when sitting. DO YOUR BEST to sleep in the bed at night. DO NOT sleep in your recliner. Long hours of sitting in a recliner leads to swelling of the legs and/or potential wounds on your backside. Additional Orders / Instructions: Follow Nutritious Diet and Increase Protein Intake Medications-Please add to medication list.: P.O. Antibiotics - Pick up and take oral antibiotics for your right leg WOUND #1: - Lower Leg Wound Laterality: Left, Medial Cleanser: Soap and Water (Home Health) 3 x Per Week/30 Days Discharge Instructions: Gently cleanse wound with antibacterial soap, rinse and pat dry prior to dressing wounds Peri-Wound Care: Desitin Maximum Strength Ointment 4 (oz) (Home Health) 3 x  Per Week/30 Days Discharge Instructions: Apply periwound Primary Dressing: Cutimed Sorbact 1.5x 2.38 (in/in) 3 x Per Week/30 Days Discharge Instructions: DAKINS MOISTENED- Secondary Dressing: Xtrasorb Large 6x9 (in/in) (Home Health) 3 x Per Week/30 Days Discharge Instructions: Apply to wound as directed. Do not cut. Compression Wrap: Medichoice 4 layer Compression System, 35-40 mmHG (Home Health) 3 x Per Week/30 Days Discharge Instructions: Apply multi-layer wrap as directed. WOUND #2: - Toe Great Wound Laterality: Right Cleanser: Wound Cleanser (Home Health) 3 x Per Week/30 Days Discharge Instructions: Wash your hands with soap and water. Remove old dressing, discard into plastic bag and place into trash. Cleanse the wound with Wound Cleanser prior to applying a clean dressing using gauze sponges, not tissues or cotton balls. Do not scrub or use excessive force. Pat dry using gauze sponges, not tissue or cotton balls. Primary Dressing: Silvercel Small 2x2 (in/in) (Home Health) 3 x Per Week/30 Days Discharge Instructions: Apply Silvercel Small 2x2 (in/in) as instructed Secondary Dressing: Gauze (Home Health) 3 x Per Week/30 Days Discharge Instructions: Cover with dry gauze Secured With: 76M Medipore H Soft Cloth Surgical Tape, 2x2 (in/yd) ([place]) 3 x Per Week/30 Days Discharge Instructions: Secure dressing 1. In office sharp debridement 2. Silver alginate to the right toe 3. 4-layer compression with Dakin's and sorbact Electronic Signature(s) Signed: 11/16/2021 10:15:23 AM By: Kalman Shan DO Entered By: Kalman Shan on 11/16/2021 10:13:27 Laura Santiago ([id number]) -------------------------------------------------------------------------------- ROS/PFSH Details Patient Name: Laura Santiago. Date of Service: 11/16/2021 9:00 AM Medical Record Number: [id number] Patient Account Number: [id number] Date of Birth/Sex: 08/06/1946 (75 y.o. F) Treating RN: Levora Dredge Primary Care Provider: Priscille Kluver Other Clinician: Cornell Barman Referring Provider: Randa Evens Treating Provider/Extender: Yaakov Guthrie in Treatment: 27 Information Obtained From Patient Eyes Medical History: Negative for: Cataracts; Glaucoma; Optic Neuritis Ear/Nose/Mouth/Throat Medical History: Negative for: Chronic sinus problems/congestion; Middle ear problems Hematologic/Lymphatic Medical History: Negative for: Anemia; Hemophilia; Human Immunodeficiency Virus; Lymphedema; Sickle Cell Disease Respiratory Medical History: Negative for: Aspiration; Asthma; Chronic Obstructive Pulmonary Disease (COPD); Pneumothorax; Sleep Apnea; Tuberculosis Cardiovascular Medical History: Positive for: Hypertension Negative for: Angina; Arrhythmia; Congestive Heart Failure; Coronary Artery Disease; Deep Vein Thrombosis; Hypotension; Myocardial Infarction; Peripheral Arterial Disease; Peripheral Venous Disease; Phlebitis; Vasculitis Gastrointestinal Medical History: Negative for: Cirrhosis ; Colitis; Crohnos; Hepatitis  A; Hepatitis B; Hepatitis C Endocrine Medical History: Negative for: Type I Diabetes; Type II Diabetes Genitourinary Medical History: Negative for: End Stage Renal Disease Immunological Medical History: Negative for: Lupus Erythematosus; Raynaudos; Scleroderma Integumentary (Skin) Medical History: Negative for: History of Burn; History of pressure wounds Musculoskeletal Medical History: Positive for: Osteoarthritis JISELLA, ASHENFELTER ([id number]) Negative for: Gout; Rheumatoid Arthritis; Osteomyelitis Oncologic Medical History: Positive for: Received Chemotherapy; Received Radiation Past Medical History Notes: breast cancer Immunizations Pneumococcal Vaccine: Received Pneumococcal Vaccination: No Implantable Devices None Family and Social History Never smoker Electronic Signature(s) Signed: 11/16/2021 11:47:32 AM By: Kalman Shan  DO Signed: 11/21/2021 11:01:31 AM By: Levora Dredge Entered By: Kalman Shan on 11/16/2021 10:36:54 Laura Santiago ([id number]) -------------------------------------------------------------------------------- [place] Details Patient Name: Laura Santiago. Date of Service: 11/16/2021 Medical Record Number: [id number] Patient Account Number: [id number] Date of Birth/Sex: August 08, 1946 (75 y.o. F) Treating RN: Levora Dredge Primary Care Provider: Orvis Brill, DOCTORS Other Clinician: Cornell Barman Referring Provider: Randa Evens Treating Provider/Extender: Yaakov Guthrie in Treatment: 19 Diagnosis Coding ICD-10 Codes Code Description [id number] 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 S91.101D Unspecified open wound of right great toe without damage to nail, subsequent encounter I87.2 Venous insufficiency (chronic) (peripheral) Z79.01 Long term (current) use of anticoagulants I10 Essential (primary) hypertension 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 Facility Procedures CPT4 Code: 73419379 Description: 02409 - DEBRIDE WOUND 1ST 20 SQ CM OR < Modifier: Quantity: 1 CPT4 Code: Description: ICD-10 Diagnosis Description S91.101D Unspecified open wound of right great toe without damage to nail, subseq Modifier: uent encounter Quantity: Physician Procedures CPT4 Code: 7353299 Description: 97597 - WC PHYS DEBR WO ANESTH 20 SQ CM Modifier: Quantity: 1 CPT4 Code: Description: ICD-10 Diagnosis Description S91.101D Unspecified open wound of right great toe without damage to nail, subseq Modifier: uent encounter Quantity: Electronic Signature(s) Signed: 11/16/2021 10:15:23 AM By: Kalman Shan DO Entered By: Kalman Shan on 11/16/2021 10:13:49

## 2021-11-21 NOTE — Progress Notes (Signed)
RYLYNN, KOBS ([id number]) Visit Report for 11/16/2021 Arrival Information Details Patient Name: Laura Santiago. Date of Service: 11/16/2021 9:00 AM Medical Record Number: [id number] Patient Account Number: [id number] Date of Birth/Sex: Aug 06, 1946 (75 y.o. F) Treating RN: Laura Santiago Primary Care Laura Santiago: Laura Santiago, DOCTORS Other Clinician: Cornell Santiago Referring Laura Santiago: Laura Santiago Treating Laura Santiago/Extender: Laura Santiago in Treatment: 19 Visit Information History Since Last Visit Added or deleted any medications: No Patient Arrived: Ambulatory Any new allergies or adverse reactions: No Arrival Time: 08:39 Had a fall or experienced change in No Accompanied By: self activities of daily living that may affect Transfer Assistance: None risk of falls: Patient Identification Verified: Yes Hospitalized since last visit: No Secondary Verification Process Completed: Yes Has Dressing in Place as Prescribed: Yes Patient Has Alerts: Yes Has Footwear/Offloading in Place as Prescribed: No Patient Alerts: PT HAS SERVICE ANIMAL Pain Present Now: No ABI 07/11/21 R) 1.16 L) 1.27 Electronic Signature(s) Signed: 11/21/2021 11:01:31 AM By: Laura Santiago Entered By: Laura Santiago on 11/16/2021 08:44:40 Laura Santiago ([id number]) -------------------------------------------------------------------------------- Encounter Discharge Information Details Patient Name: Laura Santiago. Date of Service: 11/16/2021 9:00 AM Medical Record Number: [id number] Patient Account Number: [id number] Date of Birth/Sex: May 19, 1946 (75 y.o. F) Treating RN: Laura Santiago Primary Care Laura Santiago: Laura Santiago, DOCTORS Other Clinician: Cornell Santiago Referring Laura Santiago: Laura Santiago Treating Laura Santiago/Extender: Laura Santiago in Treatment: 19 Encounter Discharge Information Items Post Procedure Vitals Discharge Condition: Stable Temperature (F): 97.5 Ambulatory Status: Walker Pulse  (bpm): 95 Discharge Destination: Other (Note Required) Respiratory Rate (breaths/min): 18 Telephoned: No Blood Pressure (mmHg): 126/69 Transportation: Other Accompanied By: self Schedule Follow-up Appointment: Yes Clinical Summary of Care: Electronic Signature(s) Signed: 11/21/2021 11:01:31 AM By: Laura Santiago Entered By: Laura Santiago on 11/16/2021 09:59:12 Laura Santiago ([id number]) -------------------------------------------------------------------------------- Lower Extremity Assessment Details Patient Name: Laura Santiago. Date of Service: 11/16/2021 9:00 AM Medical Record Number: [id number] Patient Account Number: [id number] Date of Birth/Sex: 1946-06-04 (75 y.o. F) Treating RN: Laura Santiago Primary Care Laura Santiago: Laura Santiago Other Clinician: Cornell Santiago Referring Alaila Pillard: Laura Santiago Treating Hazley Dezeeuw/Extender: Laura Santiago in Treatment: 19 Edema Assessment Assessed: [Left: No] [Right: No] Edema: [Left: No] [Right: Yes] Calf Left: Right: Point of Measurement: 29 cm From Medial Instep 31.5 cm 39.5 cm Ankle Left: Right: Point of Measurement: 10 cm From Medial Instep 22 cm 23 cm Vascular Assessment Pulses: Dorsalis Pedis Palpable: [Left:Yes] [Right:Yes] Electronic Signature(s) Signed: 11/21/2021 11:01:31 AM By: Laura Santiago Entered By: Laura Santiago on 11/16/2021 09:01:48 Laura Santiago ([id number]) -------------------------------------------------------------------------------- Multi Wound Chart Details Patient Name: Laura Santiago. Date of Service: 11/16/2021 9:00 AM Medical Record Number: [id number] Patient Account Number: [id number] Date of Birth/Sex: [birth year] (75 y.o. 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, Other Comorbid History: Hypertension, Osteoarthritis, Hypertension, Osteoarthritis, Hypertension, Osteoarthritis, Received Chemotherapy, Received Received Chemotherapy, Received Received Chemotherapy, Received Radiation Radiation Radiation Date Acquired: 04/06/2021 08/24/2021 10/10/2021 Weeks of Treatment: _0 Wound Status: Open Open Healed - Epithelialized Clustered Wound: Yes No Yes Clustered Quantity: N/A N/A 2 Measurements L x W x D (cm) 9.6x6.1x0.3 0.1x0.2x0.1 0x0x0 Area (cm) : 45.993 0.016  0 Volume (cm) : 13.798 0.002 0 % Reduction in Area: 49.10% 89.80% 100.00% % Reduction in Volume: 23.60% 96.80% 100.00% Classification: Full Thickness Without Exposed Full Thickness Without Exposed Partial Thickness Support Structures Support Structures Exudate Amount: Medium Medium None Present Exudate Type: Serosanguineous Serous N/A Exudate Color: red, brown amber N/A Wound Margin: N/A Thickened N/A Granulation Amount: Large (67-100%) Large (67-100%) Small (1-33%) Granulation Quality: Red, Pink Red Pink Necrotic Amount: Small (1-33%) None Present (0%) Large (67-100%) Necrotic Tissue: Adherent Slough N/A Eschar Exposed Structures: Fat Layer (Subcutaneous Tissue): Fat Layer (Subcutaneous Tissue): Fat Layer (Subcutaneous Tissue): Yes Yes Yes Fascia: No Fascia: No Fascia: No Tendon: No Tendon: No Tendon: No Muscle: No Muscle: No Muscle: No Joint: No Joint: No Joint: No Bone: No Bone: No Bone: No Epithelialization: N/A None Small (1-33%) Debridement: N/A Debridement - Selective/Open N/A Wound Pre-procedure Verification/Time  N/A 09:37 N/A Out Taken: Pain Control: N/A Lidocaine N/A Level: N/A Skin/Dermis N/A Debridement Area (sq cm): N/A 0.02 N/A Instrument: N/A Curette N/A Bleeding: N/A Minimum N/A Laura Santiago ([id number]) Hemostasis Achieved: N/A Pressure N/A Debridement Treatment N/A Procedure was tolerated well N/A Response: Post Debridement N/A 0.3x0.4x0.1 N/A Measurements L x W x D (cm) Post Debridement Volume: N/A 0.009 N/A (cm) Procedures Performed: N/A Debridement N/A Treatment Notes Electronic Signature(s) Signed: 11/21/2021 11:01:31 AM By: Laura Santiago Entered By: Laura Santiago on 11/16/2021 09:53:34 Laura Santiago ([id number]) -------------------------------------------------------------------------------- [place] Details Patient Name: Laura Santiago. Date of Service: 11/16/2021 9:00 AM Medical Record Number: [id number] Patient Account Number: [id number] Date of Birth/Sex: August 24, 1946 (75 y.o. F) Treating RN: Laura Santiago Primary Care Zaedyn Covin: Laura Santiago, DOCTORS Other Clinician: Cornell Santiago Referring Kyndra Condron: Laura Santiago Treating Nadja Lina/Extender: Laura Santiago in Treatment: 48 Active Inactive Wound/Skin Impairment Nursing Diagnoses: Impaired tissue integrity Goals: Patient/caregiver will verbalize understanding of skin care regimen Date Initiated: 07/06/2021 Date Inactivated: 07/27/2021 Target Resolution Date: 07/06/2021 Goal Status: Met Ulcer/skin breakdown will have a volume reduction of 30% by week 4 Date Initiated: 07/06/2021 Date Inactivated: 10/12/2021 Target Resolution Date: 08/06/2021 Goal Status: Unmet Unmet Reason: cont tx Ulcer/skin breakdown will have a volume reduction of 50% by week 8 Date Initiated: 07/06/2021 Target Resolution Date: 09/06/2021 Goal Status: Active Ulcer/skin breakdown will have a volume reduction of 80% by week 12 Date Initiated: 07/06/2021 Target Resolution Date: 10/06/2021 Goal Status:  Active Ulcer/skin breakdown will heal within 14 weeks Date Initiated: 07/06/2021 Target Resolution Date: 11/06/2021 Goal Status: Active Interventions: Assess patient/caregiver ability to obtain necessary supplies Assess patient/caregiver ability to perform ulcer/skin care regimen upon admission and as needed Assess ulceration(s) every visit Treatment Activities: Referred to DME Ulysess Witz for dressing supplies : 07/06/2021 Skin care regimen initiated : 07/06/2021 Notes: Electronic Signature(s) Signed: 11/21/2021 11:01:31 AM By: Laura Santiago Entered By: Laura Santiago on 11/16/2021 09:52:39 Laura Santiago ([id number]) -------------------------------------------------------------------------------- Pain Assessment Details Patient Name: Laura Santiago. Date of Service: 11/16/2021 9:00 AM Medical Record Number: [id number] Patient Account Number: [id number] Date of Birth/Sex: 01/08/1946 (75 y.o. F) Treating RN: Laura Santiago Primary Care Dynastie Knoop: Laura Santiago Other Clinician: Cornell Santiago Referring Adriann Ballweg: Laura Santiago Treating Maridee Slape/Extender: Laura Santiago in Treatment: 19 Active Problems Location of Pain Severity and Description of Pain Patient Has Paino No Site Locations Rate the pain. Current Pain Level: 0 Pain Management and Medication Current Pain Management: Electronic Signature(s) Signed: 11/21/2021 11:01:31 AM By: Laura Santiago Entered By: Laura Santiago on 11/16/2021 08:46:26 BRYA, SIMERLY ([id number]) -------------------------------------------------------------------------------- Patient/Caregiver Education Details Patient Name: Laura Santiago. Date of Service:  11/16/2021 9:00 AM Medical Record Number: [id number] Patient Account Number: [id number] Date of Birth/Gender: 08/30/1946 (75 y.o. F) Treating RN: Laura Santiago Primary Care Physician: Laura Santiago Other Clinician: Cornell Santiago Referring Physician: Randa Santiago Treating Physician/Extender: Laura Santiago in Treatment: 72 Education Assessment Education Provided To: Patient Education Topics Provided Wound/Skin Impairment: Handouts: Caring for Your Ulcer Methods: Explain/Verbal Responses: State content correctly Electronic Signature(s) Signed: 11/21/2021 11:01:31 AM By: Laura Santiago Entered By: Laura Santiago on 11/16/2021 09:55:20 Laura Santiago ([id number]) -------------------------------------------------------------------------------- Wound Assessment Details Patient Name: Laura Santiago. Date of Service: 11/16/2021 9:00 AM Medical Record Number: [id number] Patient Account Number: [id number] Date of Birth/Sex: Jul 23, 1946 (75 y.o. F) Treating RN: Laura Santiago Primary Care Keith Cancio: Laura Santiago, DOCTORS Other Clinician: Cornell Santiago Referring Remingtyn Depaola: Laura Santiago Treating Tamika Shropshire/Extender: Laura Santiago in Treatment: 19 Wound Status Wound Number: 1 Primary Venous Leg Ulcer Etiology: Wound Location: Left, Medial Lower Leg Wound Status: Open Wounding Event: Gradually Appeared Comorbid Hypertension, Osteoarthritis, Received Chemotherapy, Date Acquired: 04/06/2021 History: Received Radiation Weeks Of Treatment: 19 Clustered Wound: Yes Photos Wound Measurements Length: (cm) 9.6 Width: (cm) 6.1 Depth: (cm) 0.3 Area: (cm) 45.993 Volume: (cm) 13.798 % Reduction in Area: 49.1% % Reduction in Volume: 23.6% Wound Description Classification: Full Thickness Without Exposed Support Structu Exudate Amount: Medium Exudate Type: Serosanguineous Exudate Color: red, brown res Foul Odor After Cleansing: No Slough/Fibrino Yes Wound Bed Granulation Amount: Large (67-100%) Exposed Structure Granulation Quality: Red, Pink Fascia Exposed: No Necrotic Amount: Small (1-33%) Fat Layer (Subcutaneous Tissue) Exposed: Yes Necrotic Quality: Adherent Slough Tendon Exposed: No Muscle Exposed: No Joint Exposed:  No Bone Exposed: No Treatment Notes Wound #1 (Lower Leg) Wound Laterality: Left, Medial Cleanser Soap and Water Discharge Instruction: Gently cleanse wound with antibacterial soap, rinse and pat dry prior to dressing wounds Peri-Wound Care Desitin Maximum Strength Ointment 4 (oz) MATELYN, ANTONELLI ([id number]) Discharge Instruction: Apply periwound Topical Primary Dressing Cutimed Sorbact 1.5x 2.38 (in/in) Discharge Instruction: DAKINS MOISTENED- Secondary Dressing Xtrasorb Large 6x9 (in/in) Discharge Instruction: Apply to wound as directed. Do not cut. Secured With Compression Wrap Medichoice 4 layer Compression System, 35-40 mmHG Discharge Instruction: Apply multi-layer wrap as directed. Compression Stockings Add-Ons Electronic Signature(s) Signed: 11/21/2021 11:01:31 AM By: Laura Santiago Entered By: Laura Santiago on 11/16/2021 09:04:50 ALESIA, OSHIELDS ([id number]) -------------------------------------------------------------------------------- Wound Assessment Details Patient Name: DANDREA, MEDDERS. Date of Service: 11/16/2021 9:00 AM Medical Record Number: [id number] Patient Account Number: [id number] Date of Birth/Sex: February 12, 1946 (75 y.o. F) Treating RN: Laura Santiago Primary Care Liset Mcmonigle: Laura Santiago, DOCTORS Other Clinician: Cornell Santiago Referring Mekhai Venuto: Laura Santiago Treating Rajon Bisig/Extender: Laura Santiago in Treatment: 19 Wound Status Wound Number: 2 Primary Neuropathic Ulcer-Non Diabetic Etiology: Wound Location: Right Toe Great Wound Status: Open Wounding Event: Gradually Appeared Comorbid Hypertension, Osteoarthritis, Received Chemotherapy, Date Acquired: 08/24/2021 History: Received Radiation Weeks Of Treatment: 12 Clustered Wound: No Photos Wound Measurements Length: (cm) 0.1 Width: (cm) 0.2 Depth: (cm) 0.1 Area: (cm) 0.016 Volume: (cm) 0.002 % Reduction in Area: 89.8% % Reduction in Volume: 96.8% Epithelialization:  None Wound Description Classification: Full Thickness Without Exposed Support Structures Wound Margin: Thickened Exudate Amount: Medium Exudate Type: Serous Exudate Color: amber Foul Odor After Cleansing: No Slough/Fibrino No Wound Bed Granulation Amount: Large (67-100%) Exposed Structure Granulation Quality: Red Fascia Exposed: No Necrotic Amount: None Present (0%) Fat Layer (Subcutaneous Tissue) Exposed: Yes Tendon Exposed: No Muscle Exposed: No Joint Exposed: No Bone Exposed: No Treatment Notes Wound #2 (Toe Great) Wound Laterality: Right Cleanser Wound Cleanser Discharge Instruction: Wash  your hands with soap and water. Remove old dressing, discard into plastic bag and place into trash. Cleanse the wound with Wound Cleanser prior to applying a clean dressing using gauze sponges, not tissues or cotton balls. Do not scrub or use excessive force. Pat dry using gauze sponges, not tissue or cotton balls. SHELISHA, GAUTIER ([id number]) Peri-Wound Care Topical Primary Dressing Silvercel Small 2x2 (in/in) Discharge Instruction: Apply Silvercel Small 2x2 (in/in) as instructed Secondary Dressing Gauze Discharge Instruction: Cover with dry gauze Secured With 45M [place] Surgical Tape, 2x2 (in/yd) Discharge Instruction: Secure dressing Compression Wrap Compression Stockings Add-Ons Electronic Signature(s) Signed: 11/21/2021 11:01:31 AM By: Laura Santiago Entered By: Laura Santiago on 11/16/2021 09:07:12 Laura Santiago ([id number]) -------------------------------------------------------------------------------- Wound Assessment Details Patient Name: Laura Santiago. Date of Service: 11/16/2021 9:00 AM Medical Record Number: [id number] Patient Account Number: [id number] Date of Birth/Sex: 10/08/1946 (75 y.o. F) Treating RN: Laura Santiago Primary Care Raigan Baria: Laura Santiago, DOCTORS Other Clinician: Cornell Santiago Referring Larissa Pegg: Laura Santiago Treating  Krysta Bloomfield/Extender: Laura Santiago in Treatment: 19 Wound Status Wound Number: 5 Primary Trauma, Other Etiology: Wound Location: Left, Lateral Lower Leg Wound Status: Healed - Epithelialized Wounding Event: Trauma Comorbid Hypertension, Osteoarthritis, Received Chemotherapy, Date Acquired: 10/10/2021 History: Received Radiation Weeks Of Treatment: 2 Clustered Wound: Yes Photos Wound Measurements Length: (cm) 0 % Re Width: (cm) 0 % Re Depth: (cm) 0 Epit Clustered Quantity: 2 Tunn Area: (cm) 0 Und Volume: (cm) 0 duction in Area: 100% duction in Volume: 100% helialization: Small (1-33%) eling: No ermining: No Wound Description Classification: Partial Thickness Foul Exudate Amount: None Present Slou Odor After Cleansing: No gh/Fibrino No Wound Bed Granulation Amount: Small (1-33%) Exposed Structure Granulation Quality: Pink Fascia Exposed: No Necrotic Amount: Large (67-100%) Fat Layer (Subcutaneous Tissue) Exposed: Yes Necrotic Quality: Eschar Tendon Exposed: No Muscle Exposed: No Joint Exposed: No Bone Exposed: No Treatment Notes Wound #5 (Lower Leg) Wound Laterality: Left, Lateral Cleanser Peri-Wound Care Topical Primary Dressing VENICIA, VANDALL ([id number]) Secondary Dressing Secured With Compression Wrap Compression Stockings Add-Ons Electronic Signature(s) Signed: 11/21/2021 11:01:31 AM By: Laura Santiago Previous Signature: 11/16/2021 9:16:59 AM Version By: Laura Santiago Entered By: Laura Santiago on 11/16/2021 09:49:04 Laura Santiago ([id number]) -------------------------------------------------------------------------------- Vitals Details Patient Name: Laura Santiago. Date of Service: 11/16/2021 9:00 AM Medical Record Number: [id number] Patient Account Number: [id number] Date of Birth/Sex: 1946-09-06 (75 y.o. F) Treating RN: Laura Santiago Primary Care Khala Tarte: Laura Santiago, DOCTORS Other Clinician: Cornell Santiago Referring  Charnelle Bergeman: Laura Santiago Treating Analy Bassford/Extender: Laura Santiago in Treatment: 19 Vital Signs Time Taken: 08:45 Temperature (F): 97.5 Height (in): 66 Pulse (bpm): 95 Weight (lbs): 153 Respiratory Rate (breaths/min): 18 Body Mass Index (BMI): 24.7 Blood Pressure (mmHg): 126/69 Reference Range: 80 - 120 mg / dl Electronic Signature(s) Signed: 11/21/2021 11:01:31 AM By: Laura Santiago Entered By: Laura Santiago on 11/16/2021 08:46:15

## 2021-11-22 ENCOUNTER — Other Ambulatory Visit: Payer: Medicare Other

## 2021-11-22 ENCOUNTER — Telehealth: Payer: Self-pay | Admitting: *Deleted

## 2021-11-22 ENCOUNTER — Ambulatory Visit
Admission: RE | Admit: 2021-11-22 | Discharge: 2021-11-22 | Disposition: A | Discharge status: Home / Self Care | Payer: Medicare Other | Source: Ambulatory Visit | Attending: Radiation Oncology | Admitting: Radiation Oncology

## 2021-11-22 DIAGNOSIS — Z510 Encounter for antineoplastic radiation therapy: Secondary | ICD-10-CM | POA: Diagnosis not present

## 2021-11-22 NOTE — Telephone Encounter (Signed)
RN received an incoming fax from [facility]. Patient needs dental clearance for filling/crown/bridges. I can complete the form, but the office would like to know if patient can safety be treated in the dental clinic. Jenny/ Lauren - Please advise if appropriate at this time as patient rcvd taxol and Zometa on 11/18/21.

## 2021-11-23 ENCOUNTER — Other Ambulatory Visit: Payer: Self-pay | Admitting: Oncology

## 2021-11-23 ENCOUNTER — Other Ambulatory Visit: Payer: Self-pay

## 2021-11-23 ENCOUNTER — Encounter (HOSPITAL_BASED_OUTPATIENT_CLINIC_OR_DEPARTMENT_OTHER): Payer: Medicare Other | Admitting: Internal Medicine

## 2021-11-23 ENCOUNTER — Encounter: Payer: Self-pay | Admitting: Oncology

## 2021-11-23 DIAGNOSIS — Z86718 Personal history of other venous thrombosis and embolism: Secondary | ICD-10-CM | POA: Diagnosis not present

## 2021-11-23 DIAGNOSIS — L97822 Non-pressure chronic ulcer of other part of left lower leg with fat layer exposed: Secondary | ICD-10-CM

## 2021-11-23 DIAGNOSIS — I872 Venous insufficiency (chronic) (peripheral): Secondary | ICD-10-CM | POA: Diagnosis not present

## 2021-11-23 DIAGNOSIS — Z8619 Personal history of other infectious and parasitic diseases: Secondary | ICD-10-CM | POA: Diagnosis not present

## 2021-11-23 DIAGNOSIS — S91101D Unspecified open wound of right great toe without damage to nail, subsequent encounter: Secondary | ICD-10-CM

## 2021-11-23 DIAGNOSIS — C801 Malignant (primary) neoplasm, unspecified: Secondary | ICD-10-CM | POA: Diagnosis not present

## 2021-11-23 DIAGNOSIS — I87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: Secondary | ICD-10-CM | POA: Diagnosis not present

## 2021-11-23 DIAGNOSIS — X58XXXD Exposure to other specified factors, subsequent encounter: Secondary | ICD-10-CM | POA: Diagnosis not present

## 2021-11-23 DIAGNOSIS — C79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: Secondary | ICD-10-CM | POA: Diagnosis not present

## 2021-11-23 DIAGNOSIS — Z7901 Long term (current) use of anticoagulants: Secondary | ICD-10-CM | POA: Diagnosis not present

## 2021-11-23 DIAGNOSIS — I129 Hypertensive chronic kidney disease with stage 1 through stage 4 chronic kidney disease, or unspecified chronic kidney disease: Secondary | ICD-10-CM | POA: Diagnosis not present

## 2021-11-23 DIAGNOSIS — N184 Chronic kidney disease, stage 4 (severe): Secondary | ICD-10-CM | POA: Diagnosis not present

## 2021-11-23 NOTE — Telephone Encounter (Signed)
I reached out to [facility] to obtain more information on what dental treatment is needed. Per dentist office, Dr. Marcello Moores will need medical clearance for pt's filling/crown and bridges. Patient is also being referred to [facility] for dental extractions. I explained to Dentist office that Dr. Janese Banks is out of the office. Per Sonia Baller, NP, a decision to clear patient for these dental procedures will need to wait until Dr. Janese Banks gets back from vacation.  I did explain to the dentist office that patient is on active chemotherapy and at risk for neutropenic infections. 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  Dentist office asked me to reach out to the patient to inform her of the delays in approval of her dental procedure. She stated that the patient has been very uncomfortable. The dental procedures have not been scheduled until this form is signed by Dr. Janese Banks. She stated that the oral surgeon's office may also be faxing a medical clearance form to our office as well.  I will send a mychar msg to patient to update her on the status of medical clearance.

## 2021-11-23 NOTE — Progress Notes (Signed)
NATALLY, RIBERA ([id number]) Visit Report for 11/23/2021 Arrival Information Details Patient Name: Laura Santiago, Laura Santiago. Date of Service: 11/23/2021 9:45 AM Medical Record Number: [id number] Patient Account Number: [id number] Date of Birth/Sex: 09/24/1946 (75 y.o. F) Treating RN: Levora Dredge Primary Care Jalah Warmuth: Priscille Kluver Other Clinician: Referring Doral Ventrella: Randa Evens Treating Novalynn Branaman/Extender: Yaakov Guthrie in Treatment: 20 Visit Information History Since Last Visit Added or deleted any medications: No Patient Arrived: [name] Any new allergies or adverse reactions: No Arrival Time: 09:51 Had a fall or experienced change in No Accompanied By: self activities of daily living that may affect Transfer Assistance: None risk of falls: Patient Identification Verified: Yes Hospitalized since last visit: No Secondary Verification Process Completed: Yes Has Dressing in Place as Prescribed: Yes Patient Has Alerts: Yes Has Compression in Place as Prescribed: Yes Patient Alerts: PT HAS SERVICE ANIMAL Has Footwear/Offloading in Place as Prescribed: Yes ABI 07/11/21 Right: Surgical Shoe with Pressure Relief R) 1.16 L) 1.27 Insole Pain Present Now: No Electronic Signature(s) Signed: 11/23/2021 4:05:40 PM By: Levora Dredge Entered By: Levora Dredge on 11/23/2021 10:00:58 Laura Santiago ([id number]) -------------------------------------------------------------------------------- Clinic Level of Care Assessment Details Patient Name: Laura Santiago. Date of Service: 11/23/2021 9:45 AM Medical Record Number: [id number] Patient Account Number: [id number] Date of Birth/Sex: 23-Feb-1946 (75 y.o. F) Treating RN: Levora Dredge Primary Care Pritesh Sobecki: Orvis Brill, DOCTORS Other Clinician: Referring Lytle Malburg: Randa Evens Treating Elinor Kleine/Extender: Yaakov Guthrie in Treatment: 20 Clinic Level of Care Assessment Items TOOL 1 Quantity Score _0  - Use when  EandM and Procedure is performed on INITIAL visit 0 ASSESSMENTS - Nursing Assessment / Reassessment _1  - General Physical Exam (combine w/ comprehensive assessment (listed just below) when performed on new 0 pt. evals) _2  - 0 Comprehensive Assessment (HX, ROS, Risk Assessments, Wounds Hx, etc.) ASSESSMENTS - Wound and Skin Assessment / Reassessment _3  - Dermatologic / Skin Assessment (not related to wound area) 0 ASSESSMENTS - Ostomy and/or Continence Assessment and Care _4  - Incontinence Assessment and Management 0 _5  - 0 Ostomy Care Assessment and Management (repouching, etc.) PROCESS - Coordination of Care _6  - Simple Patient / Family Education for ongoing care 0 _7  - 0 Complex (extensive) Patient / Family Education for ongoing care _8  - 0 Staff obtains Programmer, systems, Records, Test Results / Process Orders _9  - 0 Staff telephones HHA, Nursing Homes / Clarify orders / etc _10  - 0 Routine Transfer to another Facility (non-emergent condition) _11  - 0 Routine Hospital Admission (non-emergent condition) _12  - 0 New Admissions / Biomedical engineer / Ordering NPWT, Apligraf, etc. _13  - 0 Emergency Hospital Admission (emergent condition) PROCESS - Special Needs _14  - Pediatric / Minor Patient Management 0 _15  - 0 Isolation Patient Management _16  - 0 Hearing / Language / Visual special needs _17  - 0 Assessment of Community assistance (transportation, D/C planning, etc.) _18  - 0 Additional assistance / Altered mentation _19  - 0 Support Surface(s) Assessment (bed, cushion, seat, etc.) INTERVENTIONS - Miscellaneous _20  - External ear exam 0 _21  - 0 Patient Transfer (multiple staff / Civil Service fast streamer / Similar devices) _22  - 0 Simple Staple / Suture removal (25 or less) _23  - 0 Complex Staple / Suture removal (26 or more) _24  - 0 Hypo/Hyperglycemic Management (do not check if billed separately) _25  - 0 Ankle / Brachial Index (ABI) - do not check if billed separately Has the patient been seen at  the hospital within the last three years: Yes Total Score: 0 Level Of Care: ____ Laura Santiago ([id number]) Electronic Signature(s)  Signed: 11/23/2021 4:05:40 PM By: Levora Dredge Entered By: Levora Dredge on 11/23/2021 10:52:02 Laura Santiago ([id number]) -------------------------------------------------------------------------------- Encounter Discharge Information Details Patient Name: Laura Santiago, Laura Santiago. Date of Service: 11/23/2021 9:45 AM Medical Record Number: [id number] Patient Account Number: [id number] Date of Birth/Sex: Dec 05, 1946 (75 y.o. F) Treating RN: Levora Dredge Primary Care Halo Laski: Orvis Brill, DOCTORS Other Clinician: Referring Deveney Bayon: Randa Evens Treating Britiney Blahnik/Extender: Yaakov Guthrie in Treatment: 20 Encounter Discharge Information Items Post Procedure Vitals Discharge Condition: Stable Temperature (F): 98 Ambulatory Status: Walker Pulse (bpm): 90 Discharge Destination: Home Respiratory Rate (breaths/min): 18 Transportation: Private Auto Blood Pressure (mmHg): 127/71 Accompanied By: self Schedule Follow-up Appointment: Yes Clinical Summary of Care: Patient Declined Electronic Signature(s) Signed: 11/23/2021 4:05:40 PM By: Levora Dredge Entered By: Levora Dredge on 11/23/2021 10:56:09 Laura Santiago ([id number]) -------------------------------------------------------------------------------- Lower Extremity Assessment Details Patient Name: Laura Santiago. Date of Service: 11/23/2021 9:45 AM Medical Record Number: [id number] Patient Account Number: [id number] Date of Birth/Sex: July 11, 1946 (75 y.o. F) Treating RN: Levora Dredge Primary Care Corianna Avallone: Orvis Brill, DOCTORS Other Clinician: Referring Ilze Roselli: Randa Evens Treating Peachie Barkalow/Extender: Yaakov Guthrie in Treatment: 20 Edema Assessment Assessed: [Left: No] [Right: No] Edema: [Left: Yes] [Right: Yes] Calf Left: Right: Point of Measurement: 29 cm  From Medial Instep 38.5 cm 32 cm Ankle Left: Right: Point of Measurement: 10 cm From Medial Instep 23.2 cm 22 cm Vascular Assessment Pulses: Dorsalis Pedis Palpable: [Left:Yes] [Right:Yes] Electronic Signature(s) Signed: 11/23/2021 4:05:40 PM By: Levora Dredge Entered By: Levora Dredge on 11/23/2021 10:19:08 Laura Santiago ([id number]) -------------------------------------------------------------------------------- Multi Wound Chart Details Patient Name: Laura Santiago. Date of Service: 11/23/2021 9:45 AM Medical Record Number: [id number] Patient Account Number: [id number] Date of Birth/Sex: [birth year] (75 y.o. 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 Comorbid History: Hypertension, Osteoarthritis, Hypertension, Osteoarthritis, N/A Received Chemotherapy, Received Received Chemotherapy, Received Radiation Radiation Date Acquired: 04/06/2021 08/24/2021 N/A Weeks of Treatment: 20 13 N/A Wound Status: Open Open N/A Clustered Wound: Yes No N/A Measurements L x W x D (cm) 9.5x9.2x0.3 0.2x0.2x0.1 N/A Area (cm) : 68.644 0.031 N/A Volume (cm) : 20.593 0.003 N/A % Reduction in Area: 24.00% 80.30% N/A % Reduction in Volume: -14.00% 95.20% N/A Classification: Full Thickness Without Exposed Full Thickness Without Exposed N/A Support Structures Support Structures Exudate Amount: Medium None Present N/A Exudate Type: Serosanguineous N/A N/A Exudate Color:  red, brown N/A N/A Wound Margin: N/A Thickened N/A Granulation Amount: Large (67-100%) Small (1-33%) N/A Granulation Quality: Red, Pink Red N/A Necrotic Amount: Small (1-33%) Medium (34-66%) N/A Necrotic Tissue: Adherent Slough Eschar N/A Exposed Structures: Fat Layer (Subcutaneous Tissue): Fat Layer (Subcutaneous Tissue): N/A Yes Yes Fascia: No Fascia: No Tendon: No Tendon: No Muscle: No Muscle: No Joint: No Joint: No Bone: No Bone: No Epithelialization: Medium (34-66%) None N/A Debridement: Debridement - Selective/Open Debridement - Excisional N/A Wound Pre-procedure Verification/Time 10:25 10:32 N/A Out Taken: Tissue Debrided: Slough Subcutaneous N/A Level: Non-Viable Tissue Skin/Subcutaneous Tissue N/A Debridement Area (sq cm): 87.4 0.04 N/A Instrument: Curette Curette N/A Bleeding: Minimum None N/A Hemostasis Achieved: Pressure N/A N/A Laura Santiago, Laura Santiago. ([id number]) Debridement Treatment Procedure was tolerated well Procedure was tolerated well N/A  Response: Post Debridement 9.5x9.2x0.3 0.4x0.4x0.2 N/A Measurements L x W x D (cm) Post Debridement Volume: 20.593 0.025 N/A (cm) Procedures Performed: Debridement Debridement N/A Treatment Notes Wound #1 (Lower Leg) Wound Laterality: Left, Medial Cleanser Soap and Water Discharge Instruction: Gently cleanse wound with antibacterial soap, rinse and pat dry prior to dressing wounds Peri-Wound Care Desitin Maximum Strength Ointment 4 (oz) Discharge Instruction: Apply periwound Topical Primary Dressing Cutimed Sorbact 1.5x 2.38 (in/in) Discharge Instruction: DAKINS MOISTENED- Secondary Dressing Xtrasorb Large 6x9 (in/in) Discharge Instruction: Apply to wound as directed. Do not cut. Secured With Compression Wrap Medichoice 4 layer Compression System, 35-40 mmHG Discharge Instruction: Apply multi-layer wrap as directed. Compression Stockings Add-Ons Wound #2 (Toe Great) Wound Laterality: Right Cleanser Wound  Cleanser Discharge Instruction: Wash your hands with soap and water. Remove old dressing, discard into plastic bag and place into trash. Cleanse the wound with Wound Cleanser prior to applying a clean dressing using gauze sponges, not tissues or cotton balls. Do not scrub or use excessive force. Pat dry using gauze sponges, not tissue or cotton balls. Peri-Wound Care Topical Primary Dressing Silvercel Small 2x2 (in/in) Discharge Instruction: Apply Silvercel Small 2x2 (in/in) as instructed Secondary Dressing Gauze Discharge Instruction: Cover with dry gauze Secured With 76M [place] Surgical Tape, 2x2 (in/yd) Discharge Instruction: Secure dressing Compression Wrap Compression Stockings Laura Santiago, Laura Santiago ([id number]) Add-Ons Electronic Signature(s) Signed: 11/23/2021 11:14:56 AM By: Kalman Shan DO Entered By: Kalman Shan on 11/23/2021 11:13:38 Laura Santiago ([id number]) -------------------------------------------------------------------------------- [place] Details Patient Name: Laura Santiago, Laura Santiago. Date of Service: 11/23/2021 9:45 AM Medical Record Number: [id number] Patient Account Number: [id number] Date of Birth/Sex: January 09, 1946 (75 y.o. F) Treating RN: Levora Dredge Primary Care Zyhir Cappella: Orvis Brill, DOCTORS Other Clinician: Referring Belvia Gotschall: Randa Evens Treating Yoselyn Mcglade/Extender: Yaakov Guthrie in Treatment: 20 Active Inactive Wound/Skin Impairment Nursing Diagnoses: Impaired tissue integrity Goals: Patient/caregiver will verbalize understanding of skin care regimen Date Initiated: 07/06/2021 Date Inactivated: 07/27/2021 Target Resolution Date: 07/06/2021 Goal Status: Met Ulcer/skin breakdown will have a volume reduction of 30% by week 4 Date Initiated: 07/06/2021 Date Inactivated: 10/12/2021 Target Resolution Date: 08/06/2021 Goal Status: Unmet Unmet Reason: cont tx Ulcer/skin breakdown will have a volume  reduction of 50% by week 8 Date Initiated: 07/06/2021 Target Resolution Date: 09/06/2021 Goal Status: Active Ulcer/skin breakdown will have a volume reduction of 80% by week 12 Date Initiated: 07/06/2021 Target Resolution Date: 10/06/2021 Goal Status: Active Ulcer/skin breakdown will heal within 14 weeks Date Initiated: 07/06/2021 Target Resolution Date: 11/06/2021 Goal Status: Active Interventions: Assess patient/caregiver ability to obtain necessary supplies Assess patient/caregiver ability to perform ulcer/skin care regimen upon admission and as needed Assess ulceration(s) every visit Treatment Activities: Referred to DME Emer Onnen for dressing supplies : 07/06/2021 Skin care regimen initiated : 07/06/2021 Notes: Electronic Signature(s) Signed: 11/23/2021 4:05:40 PM By: Levora Dredge Entered By: Levora Dredge on 11/23/2021 10:26:14 Laura Santiago ([id number]) -------------------------------------------------------------------------------- Pain Assessment Details Patient Name: Laura Santiago. Date of Service: 11/23/2021 9:45 AM Medical Record Number: [id number] Patient Account Number: [id number] Date of Birth/Sex: 12/18/1945 (75 y.o. F) Treating RN: Levora Dredge Primary Care Arvle Grabe: Priscille Kluver Other Clinician: Referring Aikam Hellickson: Randa Evens Treating Javanni Maring/Extender: Yaakov Guthrie in Treatment: 20 Active Problems Location of Pain Severity and Description of Pain Patient Has Paino No Site Locations Rate the pain. Current Pain Level: 0 Pain Management and Medication Current Pain Management: Electronic Signature(s) Signed: 11/23/2021 4:05:40 PM By: Levora Dredge Entered By: Levora Dredge on 11/23/2021 10:04:00 Laura Santiago ([id number]) -------------------------------------------------------------------------------- Patient/Caregiver  Education Details Patient Name: Laura Santiago, Laura Santiago. Date of Service: 11/23/2021 9:45 AM Medical  Record Number: [id number] Patient Account Number: [id number] Date of Birth/Gender: 13-Jul-1946 (75 y.o. F) Treating RN: Levora Dredge Primary Care Physician: Priscille Kluver Other Clinician: Referring Physician: Randa Evens Treating Physician/Extender: Yaakov Guthrie in Treatment: 20 Education Assessment Education Provided To: Patient Education Topics Provided Wound/Skin Impairment: Methods: Explain/Verbal Responses: State content correctly Electronic Signature(s) Signed: 11/23/2021 4:05:40 PM By: Levora Dredge Entered By: Levora Dredge on 11/23/2021 10:52:36 Laura Santiago ([id number]) -------------------------------------------------------------------------------- Wound Assessment Details Patient Name: Laura Santiago. Date of Service: 11/23/2021 9:45 AM Medical Record Number: [id number] Patient Account Number: [id number] Date of Birth/Sex: Oct 26, 1946 (75 y.o. F) Treating RN: Levora Dredge Primary Care Sherley Leser: Orvis Brill, DOCTORS Other Clinician: Referring Quenten Nawaz: Randa Evens Treating Nataleah Scioneaux/Extender: Yaakov Guthrie in Treatment: 20 Wound Status Wound Number: 1 Primary Venous Leg Ulcer Etiology: Wound Location: Left, Medial Lower Leg Wound Status: Open Wounding Event: Gradually Appeared Comorbid Hypertension, Osteoarthritis, Received Chemotherapy, Date Acquired: 04/06/2021 History: Received Radiation Weeks Of Treatment: 20 Clustered Wound: Yes Photos Wound Measurements Length: (cm) 9.5 Width: (cm) 9.2 Depth: (cm) 0.3 Area: (cm) 68.644 Volume: (cm) 20.593 % Reduction in Area: 24% % Reduction in Volume: -14% Epithelialization: Medium (34-66%) Tunneling: No Undermining: No Wound Description Classification: Full Thickness Without Exposed Support Structu Exudate Amount: Medium Exudate Type: Serosanguineous Exudate Color: red, brown res Foul Odor After Cleansing: No Slough/Fibrino Yes Wound Bed Granulation Amount: Large  (67-100%) Exposed Structure Granulation Quality: Red, Pink Fascia Exposed: No Necrotic Amount: Small (1-33%) Fat Layer (Subcutaneous Tissue) Exposed: Yes Necrotic Quality: Adherent Slough Tendon Exposed: No Muscle Exposed: No Joint Exposed: No Bone Exposed: No Treatment Notes Wound #1 (Lower Leg) Wound Laterality: Left, Medial Cleanser Soap and Water Discharge Instruction: Gently cleanse wound with antibacterial soap, rinse and pat dry prior to dressing wounds Peri-Wound Care Desitin Maximum Strength Ointment 4 (oz) Laura Santiago, Laura Santiago ([id number]) Discharge Instruction: Apply periwound Topical Primary Dressing Cutimed Sorbact 1.5x 2.38 (in/in) Discharge Instruction: DAKINS MOISTENED- Secondary Dressing Xtrasorb Large 6x9 (in/in) Discharge Instruction: Apply to wound as directed. Do not cut. Secured With Compression Wrap Medichoice 4 layer Compression System, 35-40 mmHG Discharge Instruction: Apply multi-layer wrap as directed. Compression Stockings Add-Ons Electronic Signature(s) Signed: 11/23/2021 4:05:40 PM By: Levora Dredge Entered By: Levora Dredge on 11/23/2021 10:15:24 Laura Santiago, Laura Santiago ([id number]) -------------------------------------------------------------------------------- Wound Assessment Details Patient Name: Laura Santiago, Laura Santiago. Date of Service: 11/23/2021 9:45 AM Medical Record Number: [id number] Patient Account Number: [id number] Date of Birth/Sex: [birth year] (75 y.o. F) Treating RN: Levora Dredge Primary Care Kailany Dinunzio: Orvis Brill, DOCTORS Other Clinician: Referring Lyrah Bradt: Randa Evens Treating Ohana Birdwell/Extender: Yaakov Guthrie in Treatment: 20 Wound Status Wound Number: 2 Primary Neuropathic Ulcer-Non Diabetic Etiology: Wound Location: Right Toe Great Wound Status: Open Wounding Event: Gradually Appeared Comorbid Hypertension, Osteoarthritis, Received Chemotherapy, Date Acquired: 08/24/2021 History: Received Radiation Weeks Of  Treatment: 13 Clustered Wound: No Photos Wound Measurements Length: (cm) 0.2 Width: (cm) 0.2 Depth: (cm) 0.1 Area: (cm) 0.031 Volume: (cm) 0.003 % Reduction in Area: 80.3% % Reduction in Volume: 95.2% Epithelialization: None Tunneling: No Undermining: No Wound Description Classification: Full Thickness Without Exposed Support Structures Wound Margin: Thickened Exudate Amount: None Present Foul Odor After Cleansing: No Slough/Fibrino No Wound Bed Granulation Amount: Small (1-33%) Exposed Structure Granulation Quality: Red Fascia Exposed: No Necrotic Amount: Medium (34-66%) Fat Layer (Subcutaneous Tissue) Exposed: Yes Necrotic Quality: Eschar Tendon Exposed: No Muscle Exposed: No Joint Exposed: No Bone Exposed: No Treatment Notes Wound #2 (Toe Great) Wound Laterality:  Right Cleanser Wound Cleanser Discharge Instruction: Wash your hands with soap and water. Remove old dressing, discard into plastic bag and place into trash. Cleanse the wound with Wound Cleanser prior to applying a clean dressing using gauze sponges, not tissues or cotton balls. Do not scrub or use excessive force. Pat dry using gauze sponges, not tissue or cotton balls. Peri-Wound Care Laura Santiago, HANNON ([id number]) Topical Primary Dressing Silvercel Small 2x2 (in/in) Discharge Instruction: Apply Silvercel Small 2x2 (in/in) as instructed Secondary Dressing Gauze Discharge Instruction: Cover with dry gauze Secured With 2M [place] Surgical Tape, 2x2 (in/yd) Discharge Instruction: Secure dressing Compression Wrap Compression Stockings Add-Ons Electronic Signature(s) Signed: 11/23/2021 4:05:40 PM By: Levora Dredge Entered By: Levora Dredge on 11/23/2021 10:16:36 Laura Santiago ([id number]) -------------------------------------------------------------------------------- Vitals Details Patient Name: Laura Santiago. Date of Service: 11/23/2021 9:45 AM Medical Record Number:  [id number] Patient Account Number: [id number] Date of Birth/Sex: Sep 16, 1946 (75 y.o. F) Treating RN: Levora Dredge Primary Care Wilmer Santillo: Orvis Brill, DOCTORS Other Clinician: Referring Tee Richeson: Randa Evens Treating Addysin Porco/Extender: Yaakov Guthrie in Treatment: 20 Vital Signs Time Taken: 10:01 Temperature (F): 98 Height (in): 66 Pulse (bpm): 90 Weight (lbs): 153 Respiratory Rate (breaths/min): 18 Body Mass Index (BMI): 24.7 Blood Pressure (mmHg): 127/71 Reference Range: 80 - 120 mg / dl Electronic Signature(s) Signed: 11/23/2021 4:05:40 PM By: Levora Dredge Entered By: Levora Dredge on 11/23/2021 10:03:43

## 2021-11-23 NOTE — Progress Notes (Signed)
KANIJAH, GROSECLOSE ([id number]) Visit Report for 11/23/2021 Chief Complaint Document Details Patient Name: Laura Santiago, Laura Santiago. Date of Service: 11/23/2021 9:45 AM Medical Record Number: [id number] Patient Account Number: [id number] Date of Birth/Sex: 1946-05-29 (75 y.o. F) Treating RN: Levora Dredge Primary Care Provider: Priscille Kluver Other Clinician: Referring Provider: Randa Evens Treating Provider/Extender: Yaakov Guthrie in Treatment: 20 Information Obtained from: Patient Chief Complaint Left lower extremity wound Right toe wounds Left upper lateral thigh wounds Electronic Signature(s) Signed: 11/23/2021 11:14:56 AM By: Kalman Shan DO Entered By: Kalman Shan on 11/23/2021 11:05:18 Laura Santiago ([id number]) -------------------------------------------------------------------------------- Debridement Details Patient Name: Laura Santiago. Date of Service: 11/23/2021 9:45 AM Medical Record Number: [id number] Patient Account Number: [id number] Date of Birth/Sex: 1946/10/25 (75 y.o. F) Treating RN: Levora Dredge Primary Care Provider: Priscille Kluver Other Clinician: Referring Provider: Randa Evens Treating Provider/Extender: Yaakov Guthrie in Treatment: 20 Debridement Performed for Wound #1 Left,Medial Lower Leg Assessment: Performed By: Physician Kalman Shan, MD Debridement Type: Debridement Severity of Tissue Pre Debridement: Fat layer exposed Level of Consciousness (Pre- Awake and Alert procedure): Pre-procedure Verification/Time Out Yes - 10:25 Taken: Total Area Debrided (L x W): 9.5 (cm) x 9.2 (cm) = 87.4 (cm) Tissue and other material Non-Viable, Slough, Slough debrided: Level: Non-Viable Tissue Debridement Description: Selective/Open Wound Instrument: Curette Bleeding: Minimum Hemostasis Achieved: Pressure Response to Treatment: Procedure was tolerated well Level of Consciousness (Post- Awake and  Alert procedure): Post Debridement Measurements of Total Wound Length: (cm) 9.5 Width: (cm) 9.2 Depth: (cm) 0.3 Volume: (cm) 20.593 Character of Wound/Ulcer Post Debridement: Stable Severity of Tissue Post Debridement: Fat layer exposed Post Procedure Diagnosis Same as Pre-procedure Electronic Signature(s) Signed: 11/23/2021 11:14:56 AM By: Kalman Shan DO Signed: 11/23/2021 4:05:40 PM By: Levora Dredge Entered By: Levora Dredge on 11/23/2021 10:29:26 Laura Santiago ([id number]) -------------------------------------------------------------------------------- Debridement Details Patient Name: Laura Santiago. Date of Service: 11/23/2021 9:45 AM Medical Record Number: [id number] Patient Account Number: [id number] Date of Birth/Sex: 1946/07/23 (75 y.o. F) Treating RN: Levora Dredge Primary Care Provider: Priscille Kluver Other Clinician: Referring Provider: Randa Evens Treating Provider/Extender: Yaakov Guthrie in Treatment: 20 Debridement Performed for Wound #2 Right Toe Great Assessment: Performed By: Physician Kalman Shan, MD Debridement Type: Debridement Level of Consciousness (Pre- Awake and Alert procedure): Pre-procedure Verification/Time Out Yes - 10:32 Taken: Total Area Debrided (L x W): 0.2 (cm) x 0.2 (cm) = 0.04 (cm) Tissue and other material Viable, Subcutaneous debrided: Level: Skin/Subcutaneous Tissue Debridement Description: Excisional Instrument: Curette Bleeding: None Response to Treatment: Procedure was tolerated well Level of Consciousness (Post- Awake and Alert procedure): Post Debridement Measurements of Total Wound Length: (cm) 0.4 Width: (cm) 0.4 Depth: (cm) 0.2 Volume: (cm) 0.025 Character of Wound/Ulcer Post Debridement: Stable Post Procedure Diagnosis Same as Pre-procedure Electronic Signature(s) Signed: 11/23/2021 11:14:56 AM By: Kalman Shan DO Signed: 11/23/2021 4:05:40 PM By: Levora Dredge Entered By: Levora Dredge on 11/23/2021 10:35:37 Laura Santiago ([id number]) -------------------------------------------------------------------------------- HPI Details Patient Name: Laura Santiago. Date of Service: 11/23/2021 9:45 AM Medical Record Number: [id number] Patient Account Number: [id number] Date of Birth/Sex: [birth year] (75 y.o. F) Treating RN: Levora Dredge Primary Care Provider: Priscille Kluver Other Clinician: Referring Provider: Randa Evens Treating Provider/Extender: Yaakov Guthrie in Treatment: 20 History of Present Illness HPI Description: Admission 7/27 Ms. Laura Santiago is a 75 year old female with a past medical history of ADHD, metastatic breast cancer, stage IV chronic kidney disease, history of DVT on Xarelto and chronic venous insufficiency that presents to the clinic for a chronic  left lower extremity wound. She recently moved to [facility] 4 days ago. She was being followed by wound care center in [name]. She reports a 10-year history of wounds to her left lower extremity that eventually do heal with debridement and compression therapy. She states that the current wound reopened 4 months ago and she is using Vaseline and Coban. She denies signs of infection. 8/3; patient presents for 1 week follow-up. She reports no issues or complaints today. She states she had vascular studies done in the last week. She denies signs of infection. She brought her little service dog with her today. 8/17; patient presents for follow-up. She has missed her last clinic appointment. She states she took the wrap off and attempted to rewrap her leg. She is having difficulty with transportation. She has her service dog with her today. Overall she feels well and reports improvement in wound healing. She denies signs of infection. She reports owning an old Velcro wrap compression and has this at her living facility 9/14; patient presents for  follow-up. Patient states that over the past 2 to 3 weeks she developed toe wounds to her right foot. She attributes this to tight fitting shoes. She subsequently developed cellulitis in the right leg and has been treated by doxycycline by her oncologist. She reports improvement in symptoms however continues to have some redness and swelling to this leg. To the left lower extremity patient has been having her wraps changed with home health twice weekly. She states that the [facility] is not helping control the drainage. Other than that she has no issues or complaints today. She denies signs of infection to the left lower extremity. 9/21; patient presents for follow-up. She reports seeing infectious disease for her cellulitis. She reports no further management. She has home health that changes the wraps twice weekly. She has no issues or complaints today. She denies signs of infection. 10/5; patient presents for follow-up. She has no issues or complaints today. She denies signs of infection. She states that the right great toe has not been dressed by home health. 10/12; patient presents for follow-up. She has no issues or complaints today. She reports improvement in her wound healing. She has been using silver alginate to the right great toe wound. She denies signs of infection. 10/26; patient presents for follow-up. Home health did not have sorbact so they continued to use Hydrofera Blue under the wrap. She has been using silver alginate to the great toe wound however she did not have a dressing in place today. She currently denies signs of infection. 11/2; patient presents for follow-up. She has been using sorb act under the compression wrap. She reports using silver alginate to the toe wound again she does not have a dressing in place. She currently denies signs of infection. 11/23; patient presents for follow-up. Unfortunately she has missed her last 2 clinic appointments. She was last seen 3  weeks ago. She did her own compression wrap with Kerlix and Coban yesterday after seeing vein and vascular. She has not been dressing her right great toe wound. She currently denies signs of infection. 11/30; patient presents for 1 week follow-up. She states she changed her dressing last week prior to home health and use sorb act with Dakin's and Hydrofera Blue. Home health has changed the dressing as well and they have been using sorbact. Today she reports increased redness to her right lower extremity. She has a history of cellulitis to this leg. She has been  using silver alginate to the right great toe. Unfortunately she had an episode of diarrhea prior to coming in and had feces all over the right leg and to the wrap of her left leg. 12/7; patient presents for 1 week follow-up. She states that home health did not come out to change the dressing and she took it off yesterday. It is unclear if she is dressing the right toe wound. She denies signs of infection. 12/14; patient presents for 1 week follow-up. She has no issues or complaints today. Electronic Signature(s) Signed: 11/23/2021 11:14:56 AM By: Kalman Shan DO Entered By: Kalman Shan on 11/23/2021 11:05:38 Laura Santiago ([id number]) -------------------------------------------------------------------------------- Physical Exam Details Patient Name: KATHELINE, BRENDLINGER. Date of Service: 11/23/2021 9:45 AM Medical Record Number: [id number] Patient Account Number: [id number] Date of Birth/Sex: May 16, 1946 (75 y.o. F) Treating RN: Levora Dredge Primary Care Provider: Priscille Kluver Other Clinician: Referring Provider: Randa Evens Treating Provider/Extender: Yaakov Guthrie in Treatment: 20 Constitutional . Cardiovascular . Psychiatric . Notes Left lower extremity: Large open wound to the medial aspect with nonviable tissue and granulation tissue present. Right foot: To the right great toe there is an open  wound with non viable tissue. No signs of infection to any wound bed Electronic Signature(s) Signed: 11/23/2021 11:14:56 AM By: Kalman Shan DO Entered By: Kalman Shan on 11/23/2021 11:06:34 Laura Santiago ([id number]) -------------------------------------------------------------------------------- Physician Orders Details Patient Name: Laura Santiago. Date of Service: 11/23/2021 9:45 AM Medical Record Number: [id number] Patient Account Number: [id number] Date of Birth/Sex: 02/27/1946 (75 y.o. F) Treating RN: Levora Dredge Primary Care Provider: Priscille Kluver Other Clinician: Referring Provider: Randa Evens Treating Provider/Extender: Yaakov Guthrie in Treatment: 20 Verbal / Phone Orders: No Diagnosis Coding Follow-up Appointments o Return Appointment in 1 week. o Nurse Visit as needed [place] for wound care. May utilize formulary equivalent dressing for wound treatment orders unless otherwise specified. Home Health Nurse may visit PRN to address patientos wound care needs. o Scheduled days for dressing changes to be completed; exception, patient has scheduled wound care visit that day. - Twice a week, patient comes to office on Wednesday o **Please direct any NON-WOUND related issues/requests for orders to patient's Primary Care Physician. **If current dressing causes regression in wound condition, may D/C ordered dressing product/s and apply Normal Saline Moist Dressing daily until next Jette or Other MD appointment. **Notify [facility] of regression in wound condition at [phone]. Bathing/ Shower/ Hygiene o May shower with wound dressing protected with water repellent cover or cast protector. o No tub bath. Anesthetic (Use 'Patient Medications' Section for Anesthetic Order Entry) o Lidocaine applied to wound bed Edema Control - Lymphedema /  Segmental Compressive Device / Other o Elevate leg(s) parallel to the floor when sitting. o DO YOUR BEST to sleep in the bed at night. DO NOT sleep in your recliner. Long hours of sitting in a recliner leads to swelling of the legs and/or potential wounds on your backside. Additional Orders / Instructions o Follow Nutritious Diet and Increase Protein Intake Wound Treatment Wound #1 - Lower Leg Wound Laterality: Left, Medial Cleanser: Soap and Water (Home Health) 3 x Per Week/30 Days Discharge Instructions: Gently cleanse wound with antibacterial soap, rinse and pat dry prior to dressing wounds Peri-Wound Care: Desitin Maximum Strength Ointment 4 (oz) (Home Health) 3 x Per Week/30 Days Discharge Instructions: Apply periwound Primary Dressing: Cutimed Sorbact 1.5x 2.38 (in/in) 3 x Per  Week/30 Days Discharge Instructions: DAKINS MOISTENED- Secondary Dressing: Lauraine Rinne Large 6x9 (in/in) (Home Health) 3 x Per Week/30 Days Discharge Instructions: Apply to wound as directed. Do not cut. Compression Wrap: Medichoice 4 layer Compression System, 35-40 mmHG (Home Health) 3 x Per Week/30 Days Discharge Instructions: Apply multi-layer wrap as directed. Wound #2 - Toe Great Wound Laterality: Right Cleanser: Wound Cleanser (Home Health) 3 x Per Week/30 Days Discharge Instructions: Wash your hands with soap and water. Remove old dressing, discard into plastic bag and place into trash. Cleanse the wound with Wound Cleanser prior to applying a clean dressing using gauze sponges, not tissues or cotton balls. Do not scrub or use excessive force. Pat dry using gauze sponges, not tissue or cotton balls. Primary Dressing: Silvercel Small 2x2 (in/in) (Home Health) 3 x Per Week/30 Days Discharge Instructions: Apply Silvercel Small 2x2 (in/in) as instructed PRINCES, FINGER ([id number]) Secondary Dressing: Gauze ([place]) 3 x Per Week/30 Days Discharge Instructions: Cover with dry gauze Secured With: 39M  Medipore H Soft Cloth Surgical Tape, 2x2 (in/yd) ([place]) 3 x Per Week/30 Days Discharge Instructions: Secure dressing Patient Medications Allergies: Celebrex Notifications Medication Indication Start End Dakin's Solution 11/23/2021 DOSE 1 - miscellaneous 0.125 % solution - moisten gauze and use with wrap change Electronic Signature(s) Signed: 11/23/2021 11:14:56 AM By: Kalman Shan DO Previous Signature: 11/23/2021 11:11:53 AM Version By: Kalman Shan DO Entered By: Kalman Shan on 11/23/2021 11:13:18 Laura Santiago ([id number]) -------------------------------------------------------------------------------- Problem List Details Patient Name: Laura Santiago. Date of Service: 11/23/2021 9:45 AM Medical Record Number: [id number] Patient Account Number: [id number] Date of Birth/Sex: 25-Oct-1946 (75 y.o. F) Treating RN: Levora Dredge Primary Care Provider: Orvis Brill, DOCTORS Other Clinician: Referring Provider: Randa Evens Treating Provider/Extender: Yaakov Guthrie in Treatment: 20 Active Problems ICD-10 Encounter Code Description Active Date MDM Diagnosis 9716218031 Non-pressure chronic ulcer of other part of left lower leg with fat layer 11/02/2021 No Yes exposed I87.312 Chronic venous hypertension (idiopathic) with ulcer of left lower 11/02/2021 No Yes extremity S91.101D Unspecified open wound of right great toe without damage to nail, 08/31/2021 No Yes subsequent encounter I87.2 Venous insufficiency (chronic) (peripheral) 07/06/2021 No Yes Z79.01 Long term (current) use of anticoagulants 07/06/2021 No Yes I10 Essential (primary) hypertension 07/06/2021 No Yes 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 07/06/2021 No Yes Inactive Problems ICD-10 Code Description Active Date Inactive Date S81.802A Unspecified open wound, left lower leg, initial encounter 07/06/2021 07/06/2021 S91.101A Unspecified open wound of right great toe without damage to nail,  initial 08/24/2021 08/24/2021 encounter S91.104A Unspecified open wound of right lesser toe(s) without damage to nail, initial 08/24/2021 08/24/2021 encounter Resolved Problems ICD-10 DESREE, LEAP ([id number]) Code Description Active Date Resolved Date S91.104D Unspecified open wound of right lesser toe(s) without damage to nail, 08/31/2021 08/31/2021 subsequent encounter Electronic Signature(s) Signed: 11/23/2021 11:14:56 AM By: Kalman Shan DO Entered By: Kalman Shan on 11/23/2021 11:05:13 Laura Santiago ([id number]) -------------------------------------------------------------------------------- Progress Note Details Patient Name: Laura Santiago. Date of Service: 11/23/2021 9:45 AM Medical Record Number: [id number] Patient Account Number: [id number] Date of Birth/Sex: [birth year] (75 y.o. F) Treating RN: Levora Dredge Primary Care Provider: Priscille Kluver Other Clinician: Referring Provider: Randa Evens Treating Provider/Extender: Yaakov Guthrie in Treatment: 20 Subjective Chief Complaint Information obtained from Patient Left lower extremity wound Right toe wounds Left upper lateral thigh wounds History of Present Illness (HPI) Admission 7/27 Ms. Oris Calmes is a 75 year old female with a past medical history of ADHD, metastatic breast cancer, stage IV chronic kidney disease,  history of DVT on Xarelto and chronic venous insufficiency that presents to the clinic for a chronic left lower extremity wound. She recently moved to [facility] 4 days ago. She was being followed by wound care center in [name]. She reports a 10-year history of wounds to her left lower extremity that eventually do heal with debridement and compression therapy. She states that the current wound reopened 4 months ago and she is using Vaseline and Coban. She denies signs of infection. 8/3; patient presents for 1 week follow-up. She reports no issues or complaints  today. She states she had vascular studies done in the last week. She denies signs of infection. She brought her little service dog with her today. 8/17; patient presents for follow-up. She has missed her last clinic appointment. She states she took the wrap off and attempted to rewrap her leg. She is having difficulty with transportation. She has her service dog with her today. Overall she feels well and reports improvement in wound healing. She denies signs of infection. She reports owning an old Velcro wrap compression and has this at her living facility 9/14; patient presents for follow-up. Patient states that over the past 2 to 3 weeks she developed toe wounds to her right foot. She attributes this to tight fitting shoes. She subsequently developed cellulitis in the right leg and has been treated by doxycycline by her oncologist. She reports improvement in symptoms however continues to have some redness and swelling to this leg. To the left lower extremity patient has been having her wraps changed with home health twice weekly. She states that the [facility] is not helping control the drainage. Other than that she has no issues or complaints today. She denies signs of infection to the left lower extremity. 9/21; patient presents for follow-up. She reports seeing infectious disease for her cellulitis. She reports no further management. She has home health that changes the wraps twice weekly. She has no issues or complaints today. She denies signs of infection. 10/5; patient presents for follow-up. She has no issues or complaints today. She denies signs of infection. She states that the right great toe has not been dressed by home health. 10/12; patient presents for follow-up. She has no issues or complaints today. She reports improvement in her wound healing. She has been using silver alginate to the right great toe wound. She denies signs of infection. 10/26; patient presents for follow-up.  Home health did not have sorbact so they continued to use Hydrofera Blue under the wrap. She has been using silver alginate to the great toe wound however she did not have a dressing in place today. She currently denies signs of infection. 11/2; patient presents for follow-up. She has been using sorb act under the compression wrap. She reports using silver alginate to the toe wound again she does not have a dressing in place. She currently denies signs of infection. 11/23; patient presents for follow-up. Unfortunately she has missed her last 2 clinic appointments. She was last seen 3 weeks ago. She did her own compression wrap with Kerlix and Coban yesterday after seeing vein and vascular. She has not been dressing her right great toe wound. She currently denies signs of infection. 11/30; patient presents for 1 week follow-up. She states she changed her dressing last week prior to home health and use sorb act with Dakin's and Hydrofera Blue. Home health has changed the dressing as well and they have been using sorbact. Today she reports increased redness  to her right lower extremity. She has a history of cellulitis to this leg. She has been using silver alginate to the right great toe. Unfortunately she had an episode of diarrhea prior to coming in and had feces all over the right leg and to the wrap of her left leg. 12/7; patient presents for 1 week follow-up. She states that home health did not come out to change the dressing and she took it off yesterday. It is unclear if she is dressing the right toe wound. She denies signs of infection. 12/14; patient presents for 1 week follow-up. She has no issues or complaints today. Patient History Information obtained from Patient. Social History Never smoker. Medical History Eyes Denies history of Cataracts, Glaucoma, Optic Neuritis Ear/Nose/Mouth/Throat BRANDA, CHAUDHARY ([id number]) Denies history of Chronic sinus problems/congestion, Middle ear  problems Hematologic/Lymphatic Denies history of Anemia, Hemophilia, Human Immunodeficiency Virus, Lymphedema, Sickle Cell Disease Respiratory Denies history of Aspiration, Asthma, Chronic Obstructive Pulmonary Disease (COPD), Pneumothorax, Sleep Apnea, Tuberculosis Cardiovascular Patient has history of Hypertension Denies history of Angina, Arrhythmia, Congestive Heart Failure, Coronary Artery Disease, Deep Vein Thrombosis, Hypotension, Myocardial Infarction, Peripheral Arterial Disease, Peripheral Venous Disease, Phlebitis, Vasculitis Gastrointestinal Denies history of Cirrhosis , Colitis, Crohn s, Hepatitis A, Hepatitis B, Hepatitis C Endocrine Denies history of Type I Diabetes, Type II Diabetes Genitourinary Denies history of End Stage Renal Disease Immunological Denies history of Lupus Erythematosus, Raynaud s, Scleroderma Integumentary (Skin) Denies history of History of Burn, History of pressure wounds Musculoskeletal Patient has history of Osteoarthritis Denies history of Gout, Rheumatoid Arthritis, Osteomyelitis Oncologic Patient has history of Received Chemotherapy, Received Radiation Medical And Surgical History Notes Oncologic breast cancer Objective Constitutional Vitals Time Taken: 10:01 AM, Height: 66 in, Weight: 153 lbs, BMI: 24.7, Temperature: 98 F, Pulse: 90 bpm, Respiratory Rate: 18 breaths/min, Blood Pressure: 127/71 mmHg. General Notes: Left lower extremity: Large open wound to the medial aspect with nonviable tissue and granulation tissue present. Right foot: To the right great toe there is an open wound with non viable tissue. No signs of infection to any wound bed Integumentary (Hair, Skin) Wound #1 status is Open. Original cause of wound was Gradually Appeared. The date acquired was: 04/06/2021. The wound has been in treatment 20 weeks. The wound is located on the Left,Medial Lower Leg. The wound measures 9.5cm length x 9.2cm width x 0.3cm depth;  68.644cm^2 area and 20.593cm^3 volume. There is Fat Layer (Subcutaneous Tissue) exposed. There is no tunneling or undermining noted. There is a medium amount of serosanguineous drainage noted. There is large (67-100%) red, pink granulation within the wound bed. There is a small (1-33%) amount of necrotic tissue within the wound bed including Adherent Slough. Wound #2 status is Open. Original cause of wound was Gradually Appeared. The date acquired was: 08/24/2021. The wound has been in treatment 13 weeks. The wound is located on the Right Toe Great. The wound measures 0.2cm length x 0.2cm width x 0.1cm depth; 0.031cm^2 area and 0.003cm^3 volume. There is Fat Layer (Subcutaneous Tissue) exposed. There is no tunneling or undermining noted. There is a none present amount of drainage noted. The wound margin is thickened. There is small (1-33%) red granulation within the wound bed. There is a medium (34- 66%) amount of necrotic tissue within the wound bed including Eschar. Assessment Active Problems ICD-10 Non-pressure chronic ulcer of other part of left lower leg with fat layer exposed Chronic venous hypertension (idiopathic) with ulcer of left lower extremity Unspecified open wound of right great  toe without damage to nail, subsequent encounter Venous insufficiency (chronic) (peripheral) Long term (current) use of anticoagulants Essential (primary) hypertension Ruark, Oniyah M. ([id number]) Secondary malignant neoplasm of breast Patient's left lower extremity wound has shown improvement in appearance since last clinic visit. I debrided nonviable tissue. No signs of infection on exam. I recommended continuing sorbact and Dakin's wet-to-dry under compression therapy. The right great toe wound appears well-healing. I debrided nonviable tissue. I recommended continuing silver alginate to this area. Again no signs of infection. Follow-up in 1 week Procedures Wound #1 Pre-procedure diagnosis of  Wound #1 is a Venous Leg Ulcer located on the Left,Medial Lower Leg .Severity of Tissue Pre Debridement is: Fat layer exposed. There was a Selective/Open Wound Non-Viable Tissue Debridement with a total area of 87.4 sq cm performed by Kalman Shan, MD. With the following instrument(s): Curette to remove Non-Viable tissue/material. Material removed includes [place]. No specimens were taken. A time out was conducted at 10:25, prior to the start of the procedure. A Minimum amount of bleeding was controlled with Pressure. The procedure was tolerated well. Post Debridement Measurements: 9.5cm length x 9.2cm width x 0.3cm depth; 20.593cm^3 volume. Character of Wound/Ulcer Post Debridement is stable. Severity of Tissue Post Debridement is: Fat layer exposed. 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 There was a Excisional Skin/Subcutaneous Tissue Debridement with a total area of 0.04 sq cm performed by Kalman Shan, MD. With the following instrument(s): Curette to remove Viable tissue/material. Material removed includes Subcutaneous Tissue. No specimens were taken. A time out was conducted at 10:32, prior to the start of the procedure. There was no bleeding. The procedure was tolerated well. Post Debridement Measurements: 0.4cm length x 0.4cm width x 0.2cm depth; 0.025cm^3 volume. Character of Wound/Ulcer Post Debridement is stable. Post procedure Diagnosis Wound #2: Same as Pre-Procedure Plan Follow-up Appointments: Return Appointment in 1 week. Nurse Visit as needed Home Health: [place]: - [facility] for wound care. May utilize formulary equivalent dressing for wound treatment orders unless otherwise specified. Home Health Nurse may visit PRN to address patient s wound care needs. Scheduled days for dressing changes to be completed;  exception, patient has scheduled wound care visit that day. - Twice a week, patient comes to office on Wednesday **Please direct any NON-WOUND related issues/requests for orders to patient's Primary Care Physician. **If current dressing causes regression in wound condition, may D/C ordered dressing product/s and apply Normal Saline Moist Dressing daily until next [place] or Other MD appointment. **Notify [facility] of regression in wound condition at [phone]. Bathing/ Shower/ Hygiene: May shower with wound dressing protected with water repellent cover or cast protector. No tub bath. Anesthetic (Use 'Patient Medications' Section for Anesthetic Order Entry): Lidocaine applied to wound bed Edema Control - Lymphedema / Segmental Compressive Device / Other: Elevate leg(s) parallel to the floor when sitting. DO YOUR BEST to sleep in the bed at night. DO NOT sleep in your recliner. Long hours of sitting in a recliner leads to swelling of the legs and/or potential wounds on your backside. Additional Orders / Instructions: Follow Nutritious Diet and Increase Protein Intake The following medication(s) was prescribed: Dakin's Solution miscellaneous 0.125 % solution 1 moisten gauze and use with wrap change starting 11/23/2021 WOUND #1: - Lower Leg Wound Laterality: Left, Medial Cleanser: Soap and Water (Home Health) 3 x Per Week/30 Days  Discharge Instructions: Gently cleanse wound with antibacterial soap, rinse and pat dry prior to dressing wounds Peri-Wound Care: Desitin Maximum Strength Ointment 4 (oz) (Home Health) 3 x Per Week/30 Days Discharge Instructions: Apply periwound Primary Dressing: Cutimed Sorbact 1.5x 2.38 (in/in) 3 x Per Week/30 Days Discharge Instructions: DAKINS MOISTENED- Secondary Dressing: Xtrasorb Large 6x9 (in/in) (Home Health) 3 x Per Week/30 Days Discharge Instructions: Apply to wound as directed. Do not cut. Compression Wrap: Medichoice 4 layer  Compression System, 35-40 mmHG [facility]) 3 x Per Week/30 Days KELECHI, ASTARITA ([id number]) Discharge Instructions: Apply multi-layer wrap as directed. WOUND #2: - Toe Great Wound Laterality: Right Cleanser: Wound Cleanser (Home Health) 3 x Per Week/30 Days Discharge Instructions: Wash your hands with soap and water. Remove old dressing, discard into plastic bag and place into trash. Cleanse the wound with Wound Cleanser prior to applying a clean dressing using gauze sponges, not tissues or cotton balls. Do not scrub or use excessive force. Pat dry using gauze sponges, not tissue or cotton balls. Primary Dressing: Silvercel Small 2x2 (in/in) (Home Health) 3 x Per Week/30 Days Discharge Instructions: Apply Silvercel Small 2x2 (in/in) as instructed Secondary Dressing: Gauze (Home Health) 3 x Per Week/30 Days Discharge Instructions: Cover with dry gauze Secured With: 7M Medipore H Soft Cloth Surgical Tape, 2x2 (in/yd) ([facility]) 3 x Per Week/30 Days Discharge Instructions: Secure dressing 1. In office sharp debridement 2. Dakin's wet-to-dry with sorb act under for layer compression 3. Silver alginate to the right toe wound 4. offloading to the right toe Electronic Signature(s) Signed: 11/23/2021 11:14:56 AM By: Kalman Shan DO Entered By: Kalman Shan on 11/23/2021 11:14:18 Laura Santiago ([id number]) -------------------------------------------------------------------------------- ROS/PFSH Details Patient Name: Laura Santiago. Date of Service: 11/23/2021 9:45 AM Medical Record Number: [id number] Patient Account Number: [id number] Date of Birth/Sex: 1946-07-19 (75 y.o. F) Treating RN: Levora Dredge Primary Care Provider: Priscille Kluver Other Clinician: Referring Provider: Randa Evens Treating Provider/Extender: Yaakov Guthrie in Treatment: 20 Information Obtained From Patient Eyes Medical History: Negative for: Cataracts; Glaucoma; Optic  Neuritis Ear/Nose/Mouth/Throat Medical History: Negative for: Chronic sinus problems/congestion; Middle ear problems Hematologic/Lymphatic Medical History: Negative for: Anemia; Hemophilia; Human Immunodeficiency Virus; Lymphedema; Sickle Cell Disease Respiratory Medical History: Negative for: Aspiration; Asthma; Chronic Obstructive Pulmonary Disease (COPD); Pneumothorax; Sleep Apnea; Tuberculosis Cardiovascular Medical History: Positive for: Hypertension Negative for: Angina; Arrhythmia; Congestive Heart Failure; Coronary Artery Disease; Deep Vein Thrombosis; Hypotension; Myocardial Infarction; Peripheral Arterial Disease; Peripheral Venous Disease; Phlebitis; Vasculitis Gastrointestinal Medical History: Negative for: Cirrhosis ; Colitis; Crohnos; Hepatitis A; Hepatitis B; Hepatitis C Endocrine Medical History: Negative for: Type I Diabetes; Type II Diabetes Genitourinary Medical History: Negative for: End Stage Renal Disease Immunological Medical History: Negative for: Lupus Erythematosus; Raynaudos; Scleroderma Integumentary (Skin) Medical History: Negative for: History of Burn; History of pressure wounds Musculoskeletal Medical History: Positive for: Osteoarthritis CHYANNA, FLOCK ([id number]) Negative for: Gout; Rheumatoid Arthritis; Osteomyelitis Oncologic Medical History: Positive for: Received Chemotherapy; Received Radiation Past Medical History Notes: breast cancer Immunizations Pneumococcal Vaccine: Received Pneumococcal Vaccination: No Implantable Devices None Family and Social History Never smoker Electronic Signature(s) Signed: 11/23/2021 11:14:56 AM By: Kalman Shan DO Signed: 11/23/2021 4:05:40 PM By: Levora Dredge Entered By: Kalman Shan on 11/23/2021 11:13:30 Laura Santiago ([id number]) -------------------------------------------------------------------------------- SuperBill Details Patient Name: Laura Santiago. Date of  Service: 11/23/2021 Medical Record Number: [id number] Patient Account Number: [id number] Date of Birth/Sex: 1946-09-01 (75 y.o. F) Treating RN: Levora Dredge Primary Care Provider: Orvis Brill, DOCTORS Other Clinician: Referring Provider: Randa Evens Treating  Provider/Extender: Yaakov Guthrie in Treatment: 20 Diagnosis Coding ICD-10 Codes Code Description [phone] 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 S91.101D Unspecified open wound of right great toe without damage to nail, subsequent encounter I87.2 Venous insufficiency (chronic) (peripheral) Z79.01 Long term (current) use of anticoagulants I10 Essential (primary) hypertension 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 Facility Procedures CPT4 Code: [id number] Description: 72536 - DEB SUBQ TISSUE 20 SQ CM/< Modifier: Quantity: 1 CPT4 Code: Description: ICD-10 Diagnosis Description S91.101D Unspecified open wound of right great toe without damage to nail, subsequen Modifier: t encounter Quantity: CPT4 Code: 64403474 Description: 25956 - DEBRIDE WOUND 1ST 20 SQ CM OR < Modifier: Quantity: 1 CPT4 Code: Description: ICD-10 Diagnosis Description L97.822 Non-pressure chronic ulcer of other part of left lower leg with fat layer e Modifier: xposed Quantity: CPT4 Code: 38756433 Description: 29518 - DEBRIDE WOUND EA ADDL 20 SQ CM Modifier: Quantity: 4 CPT4 Code: Description: ICD-10 Diagnosis Description L97.822 Non-pressure chronic ulcer of other part of left lower leg with fat layer e Modifier: xposed Quantity: Physician Procedures CPT4 Code: 8416606 Description: 11042 - WC PHYS SUBQ TISS 20 SQ CM Modifier: Quantity: 1 CPT4 Code: Description: ICD-10 Diagnosis Description S91.101D Unspecified open wound of right great toe without damage to nail, subsequent Modifier:  encounter Quantity: CPT4 Code: 3016010 Description: 93235 - WC PHYS DEBR WO ANESTH 20 SQ CM Modifier: Quantity: 1 CPT4 Code: Description: ICD-10 Diagnosis Description L97.822 Non-pressure chronic ulcer of other part of left lower leg with fat layer ex Modifier: posed Quantity: CPT4 Code: 5732202 Description: 54270 - WC PHYS DEBR WO ANESTH EA ADD 20 CM Modifier: Quantity: 4 CPT4 Code: Description: ICD-10 Diagnosis Description L97.822 Non-pressure chronic ulcer of other part of left lower leg with fat layer ex Modifier: posed Quantity: Electronic Signature(s) Signed: 11/23/2021 11:14:56 AM By: Kalman Shan DO Entered By: Kalman Shan on 11/23/2021 11:14:25

## 2021-11-24 ENCOUNTER — Other Ambulatory Visit: Payer: Self-pay | Admitting: *Deleted

## 2021-11-24 DIAGNOSIS — C50919 Malignant neoplasm of unspecified site of unspecified female breast: Secondary | ICD-10-CM

## 2021-11-25 ENCOUNTER — Encounter: Payer: Self-pay | Admitting: Oncology

## 2021-11-28 ENCOUNTER — Ambulatory Visit
Admission: RE | Admit: 2021-11-28 | Discharge: 2021-11-28 | Disposition: A | Discharge status: Home / Self Care | Payer: Medicare Other | Source: Ambulatory Visit | Attending: Oncology | Admitting: Oncology

## 2021-11-28 ENCOUNTER — Other Ambulatory Visit: Payer: Self-pay

## 2021-11-28 DIAGNOSIS — C50919 Malignant neoplasm of unspecified site of unspecified female breast: Secondary | ICD-10-CM | POA: Insufficient documentation

## 2021-11-28 MED ORDER — GADOBUTROL 1 MMOL/ML IV SOLN
7.0000 mL | Freq: Once | INTRAVENOUS | Status: AC | PRN
  Administered 2021-11-28: 12:00:00 7 mL via INTRAVENOUS

## 2021-11-29 ENCOUNTER — Ambulatory Visit (INDEPENDENT_AMBULATORY_CARE_PROVIDER_SITE_OTHER): Payer: Medicare Other

## 2021-11-29 ENCOUNTER — Encounter (INDEPENDENT_AMBULATORY_CARE_PROVIDER_SITE_OTHER): Payer: Self-pay | Admitting: Nurse Practitioner

## 2021-11-29 ENCOUNTER — Other Ambulatory Visit (INDEPENDENT_AMBULATORY_CARE_PROVIDER_SITE_OTHER): Payer: Self-pay | Admitting: Nurse Practitioner

## 2021-11-29 ENCOUNTER — Ambulatory Visit (INDEPENDENT_AMBULATORY_CARE_PROVIDER_SITE_OTHER): Payer: Medicare Other | Admitting: Nurse Practitioner

## 2021-11-29 VITALS — BP 145/85 | HR 88 | Ht 62.0 in | Wt 153.0 lb

## 2021-11-29 DIAGNOSIS — L97929 Non-pressure chronic ulcer of unspecified part of left lower leg with unspecified severity: Secondary | ICD-10-CM

## 2021-11-29 DIAGNOSIS — S81809A Unspecified open wound, unspecified lower leg, initial encounter: Secondary | ICD-10-CM

## 2021-11-29 DIAGNOSIS — I10 Essential (primary) hypertension: Secondary | ICD-10-CM

## 2021-11-29 MED ORDER — RIVAROXABAN 20 MG PO TABS: 20.0000 mg | ORAL_TABLET | Freq: Every day | ORAL | 3 refills | Status: DC

## 2021-11-29 NOTE — Patient Instructions (Signed)
ASK Dermatology about Tinea Versicolor /Biopsy on site of leg   ASK wound care about hyperbaric oxygen  ASK Oncologist about cancer treatment and wound healing effective ness

## 2021-11-30 ENCOUNTER — Other Ambulatory Visit: Payer: Self-pay

## 2021-11-30 ENCOUNTER — Encounter (HOSPITAL_BASED_OUTPATIENT_CLINIC_OR_DEPARTMENT_OTHER): Payer: Medicare Other | Admitting: Internal Medicine

## 2021-11-30 DIAGNOSIS — L97822 Non-pressure chronic ulcer of other part of left lower leg with fat layer exposed: Secondary | ICD-10-CM

## 2021-11-30 DIAGNOSIS — M25561 Pain in right knee: Secondary | ICD-10-CM | POA: Diagnosis not present

## 2021-11-30 DIAGNOSIS — A419 Sepsis, unspecified organism: Secondary | ICD-10-CM | POA: Diagnosis not present

## 2021-11-30 NOTE — Progress Notes (Signed)
Laura Santiago ([id number]) Visit Report for 11/30/2021 Chief Complaint Document Details Patient Name: Laura Santiago. Date of Service: 11/30/2021 8:15 AM Medical Record Number: [id number] Patient Account Number: [id number] Date of Birth/Sex: May 15, 1946 (75 y.o. F) Treating RN: Levora Dredge Primary Care Provider: Orvis Brill, DOCTORS Other Clinician: Referring Provider: Orvis Brill, DOCTORS Treating Provider/Extender: Yaakov Guthrie in Treatment: 21 Information Obtained from: Patient Chief Complaint Left lower extremity wound Right toe wounds Left upper lateral thigh wounds Electronic Signature(s) Signed: 11/30/2021 10:00:30 AM By: Kalman Shan DO Entered By: Kalman Shan on 11/30/2021 09:53:30 Laura Santiago ([id number]) -------------------------------------------------------------------------------- Debridement Details Patient Name: Laura Santiago. Date of Service: 11/30/2021 8:15 AM Medical Record Number: [id number] Patient Account Number: [id number] Date of Birth/Sex: 1946-06-08 (75 y.o. F) Treating RN: Levora Dredge Primary Care Provider: Orvis Brill, DOCTORS Other Clinician: Referring Provider: Orvis Brill, DOCTORS Treating Provider/Extender: Yaakov Guthrie in Treatment: 21 Debridement Performed for Wound #1 Left,Medial Lower Leg Assessment: Performed By: Physician Kalman Shan, MD Debridement Type: Debridement Severity of Tissue Pre Debridement: Fat layer exposed Level of Consciousness (Pre- Awake and Alert procedure): Pre-procedure Verification/Time Out Yes - 09:29 Taken: Pain Control: Lidocaine 4% Topical Solution Total Area Debrided (L x W): 9.2 (cm) x 10 (cm) = 92 (cm) Tissue and other material Non-Viable, Slough, Subcutaneous, Slough debrided: Level: Skin/Subcutaneous Tissue Debridement Description: Excisional Instrument: Curette Bleeding: Minimum Hemostasis Achieved: Pressure Response to Treatment: Procedure was  tolerated well Level of Consciousness (Post- Awake and Alert procedure): Post Debridement Measurements of Total Wound Length: (cm) 9.2 Width: (cm) 10 Depth: (cm) 0.3 Volume: (cm) 21.677 Character of Wound/Ulcer Post Debridement: Stable Severity of Tissue Post Debridement: Fat layer exposed Post Procedure Diagnosis Same as Pre-procedure Electronic Signature(s) Signed: 11/30/2021 10:00:30 AM By: Kalman Shan DO Signed: 11/30/2021 12:56:33 PM By: Levora Dredge Entered By: Levora Dredge on 11/30/2021 09:33:36 Laura Santiago ([id number]) -------------------------------------------------------------------------------- HPI Details Patient Name: Laura Santiago. Date of Service: 11/30/2021 8:15 AM Medical Record Number: [id number] Patient Account Number: [id number] Date of Birth/Sex: 08/13/1946 (75 y.o. F) Treating RN: Levora Dredge Primary Care Provider: Priscille Kluver Other Clinician: Referring Provider: Orvis Brill, DOCTORS Treating Provider/Extender: Yaakov Guthrie in Treatment: 21 History of Present Illness HPI Description: Admission 7/27 Laura Santiago is a 75 year old female with a past medical history of ADHD, metastatic breast cancer, stage IV chronic kidney disease, history of DVT on Xarelto and chronic venous insufficiency that presents to the clinic for a chronic left lower extremity wound. She recently moved to [facility] 4 days ago. She was being followed by wound care center in [name]. She reports a 10-year history of wounds to her left lower extremity that eventually do heal with debridement and compression therapy. She states that the current wound reopened 4 months ago and she is using Vaseline and Coban. She denies signs of infection. 8/3; patient presents for 1 week follow-up. She reports no issues or complaints today. She states she had vascular studies done in the last week. She denies signs of infection. She brought her  little service dog with her today. 8/17; patient presents for follow-up. She has missed her last clinic appointment. She states she took the wrap off and attempted to rewrap her leg. She is having difficulty with transportation. She has her service dog with her today. Overall she feels well and reports improvement in wound healing. She denies signs of infection. She reports owning an old Velcro wrap compression and has this at her living facility 9/14; patient presents for follow-up. Patient states that  over the past 2 to 3 weeks she developed toe wounds to her right foot. She attributes this to tight fitting shoes. She subsequently developed cellulitis in the right leg and has been treated by doxycycline by her oncologist. She reports improvement in symptoms however continues to have some redness and swelling to this leg. To the left lower extremity patient has been having her wraps changed with home health twice weekly. She states that the [facility] is not helping control the drainage. Other than that she has no issues or complaints today. She denies signs of infection to the left lower extremity. 9/21; patient presents for follow-up. She reports seeing infectious disease for her cellulitis. She reports no further management. She has home health that changes the wraps twice weekly. She has no issues or complaints today. She denies signs of infection. 10/5; patient presents for follow-up. She has no issues or complaints today. She denies signs of infection. She states that the right great toe has not been dressed by home health. 10/12; patient presents for follow-up. She has no issues or complaints today. She reports improvement in her wound healing. She has been using silver alginate to the right great toe wound. She denies signs of infection. 10/26; patient presents for follow-up. Home health did not have sorbact so they continued to use Hydrofera Blue under the wrap. She has been using  silver alginate to the great toe wound however she did not have a dressing in place today. She currently denies signs of infection. 11/2; patient presents for follow-up. She has been using sorb act under the compression wrap. She reports using silver alginate to the toe wound again she does not have a dressing in place. She currently denies signs of infection. 11/23; patient presents for follow-up. Unfortunately she has missed her last 2 clinic appointments. She was last seen 3 weeks ago. She did her own compression wrap with Kerlix and Coban yesterday after seeing vein and vascular. She has not been dressing her right great toe wound. She currently denies signs of infection. 11/30; patient presents for 1 week follow-up. She states she changed her dressing last week prior to home health and use sorb act with Dakin's and Hydrofera Blue. Home health has changed the dressing as well and they have been using sorbact. Today she reports increased redness to her right lower extremity. She has a history of cellulitis to this leg. She has been using silver alginate to the right great toe. Unfortunately she had an episode of diarrhea prior to coming in and had feces all over the right leg and to the wrap of her left leg. 12/7; patient presents for 1 week follow-up. She states that home health did not come out to change the dressing and she took it off yesterday. It is unclear if she is dressing the right toe wound. She denies signs of infection. 12/14; patient presents for 1 week follow-up. She has no issues or complaints today. 12/21; patient presents for follow-up. She has no issues or complaints today. She denies signs of infection. Electronic Signature(s) Signed: 11/30/2021 10:00:30 AM By: Kalman Shan DO Entered By: Kalman Shan on 11/30/2021 09:53:58 Laura Santiago ([id number]) -------------------------------------------------------------------------------- Physical Exam Details Patient  Name: Laura Santiago, Laura Santiago. Date of Service: 11/30/2021 8:15 AM Medical Record Number: [id number] Patient Account Number: [id number] Date of Birth/Sex: 24-Sep-1946 (75 y.o. F) Treating RN: Levora Dredge Primary Care Provider: Priscille Kluver Other Clinician: Referring Provider: HOUSECALLS, DOCTORS Treating Provider/Extender: Yaakov Guthrie in Treatment: 21 Constitutional .  Cardiovascular . Psychiatric . Notes Left lower extremity: Large open wound to the medial aspect with nonviable tissue and granulation tissue present. Right foot: To the right great toe there is an open wound with Circumferential callus. No signs of infection to any wound bed Electronic Signature(s) Signed: 11/30/2021 10:00:30 AM By: Kalman Shan DO Entered By: Kalman Shan on 11/30/2021 09:54:32 Laura Santiago ([id number]) -------------------------------------------------------------------------------- Physician Orders Details Patient Name: Laura Santiago. Date of Service: 11/30/2021 8:15 AM Medical Record Number: [id number] Patient Account Number: [id number] Date of Birth/Sex: 1946/11/18 (75 y.o. F) Treating RN: Levora Dredge Primary Care Provider: Orvis Brill, DOCTORS Other Clinician: Referring Provider: Orvis Brill, DOCTORS Treating Provider/Extender: Yaakov Guthrie in Treatment: 21 Verbal / Phone Orders: No Diagnosis Coding Follow-up Appointments o Return Appointment in 1 week. o Nurse Visit as needed [place] for wound care. May utilize formulary equivalent dressing for wound treatment orders unless otherwise specified. Home Health Nurse may visit PRN to address patientos wound care needs. o Scheduled days for dressing changes to be completed; exception, patient has scheduled wound care visit that day. - Twice a week, patient comes to office on Wednesday o **Please direct any NON-WOUND related  issues/requests for orders to patient's Primary Care Physician. **If current dressing causes regression in wound condition, may D/C ordered dressing product/s and apply Normal Saline Moist Dressing daily until next [place] or Other MD appointment. **Notify Wound Healing Center of regression in wound condition at [phone]. Bathing/ Shower/ Hygiene o May shower with wound dressing protected with water repellent cover or cast protector. o No tub bath. Anesthetic (Use 'Patient Medications' Section for Anesthetic Order Entry) o Lidocaine applied to wound bed Edema Control - Lymphedema / Segmental Compressive Device / Other o Optional: One layer of unna paste to top of compression wrap (to act as an anchor). o 4 Layer Compression System Lymphedema. o Elevate leg(s) parallel to the floor when sitting. o DO YOUR BEST to sleep in the bed at night. DO NOT sleep in your recliner. Long hours of sitting in a recliner leads to swelling of the legs and/or potential wounds on your backside. Additional Orders / Instructions o Follow Nutritious Diet and Increase Protein Intake Wound Treatment Wound #1 - Lower Leg Wound Laterality: Left, Medial Cleanser: Soap and Water (Home Health) (Generic) 3 x Per Week/30 Days Discharge Instructions: Gently cleanse wound with antibacterial soap, rinse and pat dry prior to dressing wounds Peri-Wound Care: Desitin Maximum Strength Ointment 4 (oz) (Home Health) (Generic) 3 x Per Week/30 Days Discharge Instructions: Apply periwound Primary Dressing: Cutimed Sorbact 1.5x 2.38 (in/in) (Home Health) (Generic) 3 x Per Week/30 Days Discharge Instructions: DAKINS MOISTENED- Secondary Dressing: Xtrasorb Large 6x9 (in/in) (Home Health) (Generic) 3 x Per Week/30 Days Discharge Instructions: Apply to wound as directed. Do not cut. Compression Wrap: Medichoice 4 layer Compression System, 35-40 mmHG (Home Health) (Generic) 3 x Per Week/30 Days Discharge  Instructions: Apply multi-layer wrap as directed. Wound #2 - Toe Great Wound Laterality: Right Cleanser: Wound Cleanser (Home Health) (Generic) 3 x Per Week/30 Days Discharge Instructions: Wash your hands with soap and water. Remove old dressing, discard into plastic bag and place into trash. Cleanse the wound with Wound Cleanser prior to applying a clean dressing using gauze sponges, not tissues or cotton balls. Do not scrub or use excessive force. Pat dry using gauze sponges, not tissue or cotton balls. Primary Dressing: Silvercel Small 2x2 (in/in) (Home Health) (Generic) 3  x Per Week/30 Days Laura Santiago, Laura Santiago ([id number]) Discharge Instructions: Apply Silvercel Small 2x2 (in/in) as instructed Secondary Dressing: Gauze ([place]) (Generic) 3 x Per Week/30 Days Discharge Instructions: Cover with dry gauze Secured With: 10M Medipore H Soft Cloth Surgical Tape, 2x2 (in/yd) ([place]) (Generic) 3 x Per Week/30 Days Discharge Instructions: Secure dressing Electronic Signature(s) Signed: 11/30/2021 11:56:40 AM By: Kalman Shan DO Signed: 11/30/2021 12:56:33 PM By: Levora Dredge Previous Signature: 11/30/2021 10:00:30 AM Version By: Kalman Shan DO Entered By: Levora Dredge on 11/30/2021 11:47:51 Laura Santiago ([id number]) -------------------------------------------------------------------------------- Problem List Details Patient Name: KYESHA, BALLA. Date of Service: 11/30/2021 8:15 AM Medical Record Number: [id number] Patient Account Number: [id number] Date of Birth/Sex: 04-Jan-1946 (75 y.o. F) Treating RN: Levora Dredge Primary Care Provider: Orvis Brill, DOCTORS Other Clinician: Referring Provider: Orvis Brill, DOCTORS Treating Provider/Extender: Yaakov Guthrie in Treatment: 21 Active Problems ICD-10 Encounter Code Description Active Date MDM Diagnosis L97.822 Non-pressure chronic ulcer of other part of left lower leg with fat layer 11/02/2021 No  Yes exposed I87.312 Chronic venous hypertension (idiopathic) with ulcer of left lower 11/02/2021 No Yes extremity S91.101D Unspecified open wound of right great toe without damage to nail, 08/31/2021 No Yes subsequent encounter I87.2 Venous insufficiency (chronic) (peripheral) 07/06/2021 No Yes Z79.01 Long term (current) use of anticoagulants 07/06/2021 No Yes I10 Essential (primary) hypertension 07/06/2021 No Yes 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 07/06/2021 No Yes Inactive Problems ICD-10 Code Description Active Date Inactive Date S81.802A Unspecified open wound, left lower leg, initial encounter 07/06/2021 07/06/2021 S91.101A Unspecified open wound of right great toe without damage to nail, initial 08/24/2021 08/24/2021 encounter S91.104A Unspecified open wound of right lesser toe(s) without damage to nail, initial 08/24/2021 08/24/2021 encounter Resolved Problems ICD-10 Laura Santiago, Laura Santiago ([id number]) Code Description Active Date Resolved Date S91.104D Unspecified open wound of right lesser toe(s) without damage to nail, 08/31/2021 08/31/2021 subsequent encounter Electronic Signature(s) Signed: 11/30/2021 10:00:30 AM By: Kalman Shan DO Entered By: Kalman Shan on 11/30/2021 09:53:24 Laura Santiago ([id number]) -------------------------------------------------------------------------------- Progress Note Details Patient Name: Laura Santiago. Date of Service: 11/30/2021 8:15 AM Medical Record Number: [id number] Patient Account Number: [id number] Date of Birth/Sex: 1946/11/09 (75 y.o. F) Treating RN: Levora Dredge Primary Care Provider: Priscille Kluver Other Clinician: Referring Provider: Orvis Brill, DOCTORS Treating Provider/Extender: Yaakov Guthrie in Treatment: 21 Subjective Chief Complaint Information obtained from Patient Left lower extremity wound Right toe wounds Left upper lateral thigh wounds History of Present Illness (HPI) Admission  7/27 Ms. Mashayla Lavin is a 75 year old female with a past medical history of ADHD, metastatic breast cancer, stage IV chronic kidney disease, history of DVT on Xarelto and chronic venous insufficiency that presents to the clinic for a chronic left lower extremity wound. She recently moved to [facility] 4 days ago. She was being followed by wound care center in [name]. She reports a 10-year history of wounds to her left lower extremity that eventually do heal with debridement and compression therapy. She states that the current wound reopened 4 months ago and she is using Vaseline and Coban. She denies signs of infection. 8/3; patient presents for 1 week follow-up. She reports no issues or complaints today. She states she had vascular studies done in the last week. She denies signs of infection. She brought her little service dog with her today. 8/17; patient presents for follow-up. She has missed her last clinic appointment. She states she took the wrap off and attempted to rewrap her leg. She is having difficulty with transportation.  She has her service dog with her today. Overall she feels well and reports improvement in wound healing. She denies signs of infection. She reports owning an old Velcro wrap compression and has this at her living facility 9/14; patient presents for follow-up. Patient states that over the past 2 to 3 weeks she developed toe wounds to her right foot. She attributes this to tight fitting shoes. She subsequently developed cellulitis in the right leg and has been treated by doxycycline by her oncologist. She reports improvement in symptoms however continues to have some redness and swelling to this leg. To the left lower extremity patient has been having her wraps changed with home health twice weekly. She states that the [facility] is not helping control the drainage. Other than that she has no issues or complaints today. She denies signs of infection to  the left lower extremity. 9/21; patient presents for follow-up. She reports seeing infectious disease for her cellulitis. She reports no further management. She has home health that changes the wraps twice weekly. She has no issues or complaints today. She denies signs of infection. 10/5; patient presents for follow-up. She has no issues or complaints today. She denies signs of infection. She states that the right great toe has not been dressed by home health. 10/12; patient presents for follow-up. She has no issues or complaints today. She reports improvement in her wound healing. She has been using silver alginate to the right great toe wound. She denies signs of infection. 10/26; patient presents for follow-up. Home health did not have sorbact so they continued to use Hydrofera Blue under the wrap. She has been using silver alginate to the great toe wound however she did not have a dressing in place today. She currently denies signs of infection. 11/2; patient presents for follow-up. She has been using sorb act under the compression wrap. She reports using silver alginate to the toe wound again she does not have a dressing in place. She currently denies signs of infection. 11/23; patient presents for follow-up. Unfortunately she has missed her last 2 clinic appointments. She was last seen 3 weeks ago. She did her own compression wrap with Kerlix and Coban yesterday after seeing vein and vascular. She has not been dressing her right great toe wound. She currently denies signs of infection. 11/30; patient presents for 1 week follow-up. She states she changed her dressing last week prior to home health and use sorb act with Dakin's and Hydrofera Blue. Home health has changed the dressing as well and they have been using sorbact. Today she reports increased redness to her right lower extremity. She has a history of cellulitis to this leg. She has been using silver alginate to the right great toe.  Unfortunately she had an episode of diarrhea prior to coming in and had feces all over the right leg and to the wrap of her left leg. 12/7; patient presents for 1 week follow-up. She states that home health did not come out to change the dressing and she took it off yesterday. It is unclear if she is dressing the right toe wound. She denies signs of infection. 12/14; patient presents for 1 week follow-up. She has no issues or complaints today. 12/21; patient presents for follow-up. She has no issues or complaints today. She denies signs of infection. Laura Santiago, Laura Santiago ([id number]) Objective Constitutional Vitals Time Taken: 8:54 AM, Height: 66 in, Weight: 153 lbs, BMI: 24.7, Temperature: 98 F, Pulse: 96 bpm, Respiratory Rate: 18 breaths/min,  Blood Pressure: 130/67 mmHg. General Notes: Left lower extremity: Large open wound to the medial aspect with nonviable tissue and granulation tissue present. Right foot: To the right great toe there is an open wound with Circumferential callus. No signs of infection to any wound bed Integumentary (Hair, Skin) Wound #1 status is Open. Original cause of wound was Gradually Appeared. The date acquired was: 04/06/2021. The wound has been in treatment 21 weeks. The wound is located on the Left,Medial Lower Leg. The wound measures 9.2cm length x 10cm width x 0.3cm depth; 72.257cm^2 area and 21.677cm^3 volume. There is Fat Layer (Subcutaneous Tissue) exposed. There is no tunneling or undermining noted. There is a large amount of serosanguineous drainage noted. There is medium (34-66%) red, pink granulation within the wound bed. There is a medium (34-66%) amount of necrotic tissue within the wound bed including Adherent Slough. Wound #2 status is Open. Original cause of wound was Gradually Appeared. The date acquired was: 08/24/2021. The wound has been in treatment 14 weeks. The wound is located on the Right Toe Great. The wound measures 0.1cm length x 0.1cm width x  0.1cm depth; 0.008cm^2 area and 0.001cm^3 volume. There is Fat Layer (Subcutaneous Tissue) exposed. There is no tunneling or undermining noted. There is a none present amount of drainage noted. The wound margin is thickened. There is small (1-33%) red granulation within the wound bed. There is a medium (34- 66%) amount of necrotic tissue within the wound bed including Eschar. 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 No signs of infection on exam. I debrided nonviable tissue. I recommended continuing Dakin's with sorbact under 4-layer compression to the left lower extremity and silver alginate to the right toe wound. 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 There was a Excisional Skin/Subcutaneous Tissue Debridement with a total area of 92 sq cm performed by Kalman Shan, MD. With the following instrument(s): Curette to remove Non-Viable tissue/material. Material removed includes Subcutaneous Tissue and Slough and after achieving pain control using Lidocaine 4% Topical Solution. No specimens were taken. A time out was conducted at 09:29, prior to the start of the procedure. A Minimum amount of bleeding was controlled with Pressure. The procedure was tolerated well. Post Debridement Measurements: 9.2cm length x 10cm width x 0.3cm depth; 21.677cm^3 volume. Character of Wound/Ulcer Post Debridement is stable. Severity of Tissue Post Debridement  is: Fat layer exposed. Post procedure Diagnosis Wound #1: Same as Pre-Procedure Plan Follow-up Appointments: Return Appointment in 1 week. Nurse Visit as needed Home Health: [place]: - [place] for wound care. May utilize formulary equivalent dressing for wound treatment orders unless otherwise specified. Home Laura Santiago, Laura Santiago Dunbar. ([id number]) Health Nurse may visit PRN to address patient s wound care needs. Scheduled days for dressing changes to be completed; exception, patient has scheduled wound care visit that day. - Twice a week, patient comes to office on Wednesday **Please direct any NON-WOUND related issues/requests for orders to patient's Primary Care Physician. **If current dressing causes regression in wound condition, may D/C ordered dressing product/s  and apply Normal Saline Moist Dressing daily until next [place] or Other MD appointment. **Notify Wound Healing Center of regression in wound condition at [phone]. Bathing/ Shower/ Hygiene: May shower with wound dressing protected with water repellent cover or cast protector. No tub bath. Anesthetic (Use 'Patient Medications' Section for Anesthetic Order Entry): Lidocaine applied to wound bed Edema Control - Lymphedema / Segmental Compressive Device / Other: Optional: One layer of unna paste to top of compression wrap (to act as an anchor). 4 Layer Compression System Lymphedema. Elevate leg(s) parallel to the floor when sitting. DO YOUR BEST to sleep in the bed at night. DO NOT sleep in your recliner. Long hours of sitting in a recliner leads to swelling of the legs and/or potential wounds on your backside. Additional Orders / Instructions: Follow Nutritious Diet and Increase Protein Intake WOUND #1: - Lower Leg Wound Laterality: Left, Medial Cleanser: Soap and Water (Home Health) 3 x Per Week/30 Days Discharge Instructions: Gently cleanse wound with antibacterial soap, rinse  and pat dry prior to dressing wounds Peri-Wound Care: Desitin Maximum Strength Ointment 4 (oz) (Home Health) 3 x Per Week/30 Days Discharge Instructions: Apply periwound Primary Dressing: Cutimed Sorbact 1.5x 2.38 (in/in) 3 x Per Week/30 Days Discharge Instructions: DAKINS MOISTENED- Secondary Dressing: Xtrasorb Large 6x9 (in/in) (Home Health) 3 x Per Week/30 Days Discharge Instructions: Apply to wound as directed. Do not cut. Compression Wrap: Medichoice 4 layer Compression System, 35-40 mmHG (Home Health) 3 x Per Week/30 Days Discharge Instructions: Apply multi-layer wrap as directed. WOUND #2: - Toe Great Wound Laterality: Right Cleanser: Wound Cleanser (Home Health) 3 x Per Week/30 Days Discharge Instructions: Wash your hands with soap and water. Remove old dressing, discard into plastic bag and place into trash. Cleanse the wound with Wound Cleanser prior to applying a clean dressing using gauze sponges, not tissues or cotton balls. Do not scrub or use excessive force. Pat dry using gauze sponges, not tissue or cotton balls. Primary Dressing: Silvercel Small 2x2 (in/in) (Home Health) 3 x Per Week/30 Days Discharge Instructions: Apply Silvercel Small 2x2 (in/in) as instructed Secondary Dressing: Gauze (Home Health) 3 x Per Week/30 Days Discharge Instructions: Cover with dry gauze Secured With: 17M Medipore H Soft Cloth Surgical Tape, 2x2 (in/yd) ([place]) 3 x Per Week/30 Days Discharge Instructions: Secure dressing 1. In office sharp debridement 2. Sorb act and Dakin's solution under 4-layer compression to the left lower extremity 3. Silver alginate to the right great toe 4. Follow-up in 1 week Electronic Signature(s) Signed: 11/30/2021 10:00:30 AM By: Kalman Shan DO Entered By: Kalman Shan on 11/30/2021 09:59:02 Laura Santiago ([id number]) -------------------------------------------------------------------------------- SuperBill Details Patient Name: Laura Santiago. Date of Service: 11/30/2021 Medical Record Number: [id number] Patient Account Number: [id number] Date of Birth/Sex: Aug 01, 1946 (75 y.o. 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 S91.101D Unspecified open wound of right great toe without damage to nail, subsequent encounter I87.2 Venous insufficiency (chronic) (peripheral) Z79.01 Long term (current) use of anticoagulants I10 Essential (primary) hypertension 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 Facility Procedures CPT4 Code: 00370488 Description: 89169 - DEB SUBQ TISSUE 20 SQ CM/< Modifier: Quantity: 1 CPT4 Code: Description: ICD-10 Diagnosis Description L97.822 Non-pressure chronic ulcer of other part  of left lower leg with fat layer Modifier: exposed Quantity: CPT4 Code: 80044715 Description: 80638 - DEB SUBQ TISS EA ADDL 20CM Modifier: Quantity: 4 CPT4 Code: Description: ICD-10 Diagnosis Description L97.822 Non-pressure chronic ulcer of other part of left lower leg with fat layer Modifier: exposed Quantity: Physician Procedures CPT4 Code: 6854883 Description: 01415 - WC PHYS SUBQ TISS 20 SQ CM Modifier: Quantity: 1 CPT4 Code: Description: ICD-10 Diagnosis Description L97.822 Non-pressure chronic ulcer of other part of left lower leg with fat layer Modifier: exposed Quantity: CPT4 Code: 9733125 Description: 08719 - WC PHYS SUBQ TISS EA ADDL 20 CM Modifier: Quantity: 4 CPT4 Code: Description: ICD-10 Diagnosis Description L97.822 Non-pressure chronic ulcer of other part of left lower leg with fat layer Modifier:  exposed Quantity: Electronic Signature(s) Signed: 11/30/2021 10:00:30 AM By: Kalman Shan DO Entered By: Kalman Shan on 11/30/2021 09:59:44

## 2021-11-30 NOTE — Progress Notes (Signed)
TALISE, SLIGH ([id number]) Visit Report for 11/30/2021 Arrival Information Details Patient Name: Laura Santiago, Laura Santiago. Date of Service: 11/30/2021 8:15 AM Medical Record Number: [id number] Patient Account Number: [id number] Date of Birth/Sex: [birth year] (75 y.o. F) Treating RN: Levora Dredge Primary Care Janesa Dockery: Orvis Brill, DOCTORS Other Clinician: Referring Adriyana Greenbaum: HOUSECALLS, DOCTORS Treating Vanassa Penniman/Extender: Yaakov Guthrie in Treatment: 21 Visit Information History Since Last Visit Added or deleted any medications: No Patient Arrived: Walker Any new allergies or adverse reactions: No Arrival Time: 08:53 Had a fall or experienced change in No Accompanied By: self activities of daily living that may affect Transfer Assistance: None risk of falls: Patient Identification Verified: Yes Hospitalized since last visit: No Secondary Verification Process Completed: Yes Has Dressing in Place as Prescribed: Yes Patient Has Alerts: Yes Has Compression in Place as Prescribed: Yes Patient Alerts: PT HAS SERVICE ANIMAL Pain Present Now: No ABI 07/11/21 R) 1.16 L) 1.27 Electronic Signature(s) Signed: 11/30/2021 12:56:33 PM By: Levora Dredge Entered By: Levora Dredge on 11/30/2021 08:54:26 Laura Santiago ([id number]) -------------------------------------------------------------------------------- Clinic Level of Care Assessment Details Patient Name: Laura Santiago. Date of Service: 11/30/2021 8:15 AM Medical Record Number: [id number] Patient Account Number: [id number] Date of Birth/Sex: 03/22/1946 (75 y.o. F) Treating RN: Levora Dredge Primary Care Ashleah Valtierra: Orvis Brill, DOCTORS Other Clinician: Referring Jahmez Bily: Orvis Brill, DOCTORS Treating Jacson Rapaport/Extender: Yaakov Guthrie in Treatment: 21 Clinic Level of Care Assessment Items TOOL 1 Quantity Score []  - Use when EandM and Procedure is performed on INITIAL visit 0 ASSESSMENTS - Nursing Assessment /  Reassessment []  - General Physical Exam (combine w/ comprehensive assessment (listed just below) when performed on new 0 pt. evals) []  - 0 Comprehensive Assessment (HX, ROS, Risk Assessments, Wounds Hx, etc.) ASSESSMENTS - Wound and Skin Assessment / Reassessment []  - Dermatologic / Skin Assessment (not related to wound area) 0 ASSESSMENTS - Ostomy and/or Continence Assessment and Care []  - Incontinence Assessment and Management 0 []  - 0 Ostomy Care Assessment and Management (repouching, etc.) PROCESS - Coordination of Care []  - Simple Patient / Family Education for ongoing care 0 []  - 0 Complex (extensive) Patient / Family Education for ongoing care []  - 0 Staff obtains Programmer, systems, Records, Test Results / Process Orders []  - 0 Staff telephones HHA, Nursing Homes / Clarify orders / etc []  - 0 Routine Transfer to another Facility (non-emergent condition) []  - 0 Routine Hospital Admission (non-emergent condition) []  - 0 New Admissions / Biomedical engineer / Ordering NPWT, Apligraf, etc. []  - 0 Emergency Hospital Admission (emergent condition) PROCESS - Special Needs []  - Pediatric / Minor Patient Management 0 []  - 0 Isolation Patient Management []  - 0 Hearing / Language / Visual special needs []  - 0 Assessment of Community assistance (transportation, D/C planning, etc.) []  - 0 Additional assistance / Altered mentation []  - 0 Support Surface(s) Assessment (bed, cushion, seat, etc.) INTERVENTIONS - Miscellaneous []  - External ear exam 0 []  - 0 Patient Transfer (multiple staff / Civil Service fast streamer / Similar devices) []  - 0 Simple Staple / Suture removal (25 or less) []  - 0 Complex Staple / Suture removal (26 or more) []  - 0 Hypo/Hyperglycemic Management (do not check if billed separately) []  - 0 Ankle / Brachial Index (ABI) - do not check if billed separately Has the patient been seen at the hospital within the last three years: Yes Total Score: 0 Level Of Care:  ____ Laura Santiago ([id number]) Electronic Signature(s) Signed: 11/30/2021 12:56:33 PM By: Levora Dredge Entered By: Levora Dredge on 11/30/2021 09:57:17  DIONA, PEREGOY ([id number]) -------------------------------------------------------------------------------- Encounter Discharge Information Details Patient Name: Laura Santiago. Date of Service: 11/30/2021 8:15 AM Medical Record Number: [id number] Patient Account Number: [id number] Date of Birth/Sex: 1946-03-08 (75 y.o. F) Treating RN: Levora Dredge Primary Care Hubert Raatz: Orvis Brill, DOCTORS Other Clinician: Referring Jerzi Tigert: Orvis Brill, DOCTORS Treating Brittanee Ghazarian/Extender: Yaakov Guthrie in Treatment: 21 Encounter Discharge Information Items Post Procedure Vitals Discharge Condition: Stable Temperature (F): 98 Ambulatory Status: Walker Pulse (bpm): 96 Discharge Destination: Home Respiratory Rate (breaths/min): 18 Transportation: Other Blood Pressure (mmHg): 130/67 Accompanied By: self Schedule Follow-up Appointment: Yes Clinical Summary of Care: Electronic Signature(s) Signed: 11/30/2021 12:56:33 PM By: Levora Dredge Entered By: Levora Dredge on 11/30/2021 10:00:55 Laura Santiago ([id number]) -------------------------------------------------------------------------------- Lower Extremity Assessment Details Patient Name: Laura Santiago. Date of Service: 11/30/2021 8:15 AM Medical Record Number: [id number] Patient Account Number: [id number] Date of Birth/Sex: 20-Jan-1946 (75 y.o. F) Treating RN: Levora Dredge Primary Care Lincoln Kleiner: Orvis Brill, DOCTORS Other Clinician: Referring Edye Hainline: HOUSECALLS, DOCTORS Treating Annelle Behrendt/Extender: Yaakov Guthrie in Treatment: 21 Edema Assessment Assessed: [Left: No] [Right: No] Edema: [Left: Yes] [Right: Yes] Calf Left: Right: Point of Measurement: 29 cm From Medial Instep 32 cm 39.5 cm Ankle Left: Right: Point of Measurement: 10 cm  From Medial Instep 23.3 cm 24 cm Vascular Assessment Pulses: Dorsalis Pedis Palpable: [Left:Yes] [Right:Yes] Electronic Signature(s) Signed: 11/30/2021 12:56:33 PM By: Levora Dredge Entered By: Levora Dredge on 11/30/2021 09:15:58 Laura Santiago ([id number]) -------------------------------------------------------------------------------- Multi Wound Chart Details Patient Name: Laura Santiago. Date of Service: 11/30/2021 8:15 AM Medical Record Number: [id number] Patient Account Number: [id number] Date of Birth/Sex: Apr 11, 1946 (75 y.o. 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 Comorbid History: Hypertension, Osteoarthritis, Hypertension, Osteoarthritis, N/A Received Chemotherapy, Received Received Chemotherapy, Received Radiation Radiation Date Acquired: 04/06/2021 08/24/2021 N/A Weeks of Treatment: 21 14 N/A Wound Status: Open Open N/A Clustered Wound: Yes No N/A Measurements L x W x D (cm) 9.2x10x0.3 0.1x0.1x0.1 N/A Area (cm) : 72.257 0.008 N/A Volume (cm) : 21.677 0.001 N/A % Reduction in Area: 20.00% 94.90% N/A % Reduction in Volume: -20.00% 98.40% N/A Classification: Full Thickness Without Exposed Full Thickness Without Exposed N/A Support Structures Support Structures Exudate Amount: Large None Present N/A Exudate Type: Serosanguineous N/A N/A Exudate Color: red, brown N/A N/A Wound Margin: N/A Thickened N/A Granulation Amount: Medium  (34-66%) Small (1-33%) N/A Granulation Quality: Red, Pink Red N/A Necrotic Amount: Medium (34-66%) Medium (34-66%) N/A Necrotic Tissue: Adherent Slough Eschar N/A Exposed Structures: Fat Layer (Subcutaneous Tissue): Fat Layer (Subcutaneous Tissue): N/A Yes Yes Fascia: No Fascia: No Tendon: No Tendon: No Muscle: No Muscle: No Joint: No Joint: No Bone: No Bone: No Epithelialization: Medium (34-66%) None N/A Treatment Notes Electronic Signature(s) Signed: 11/30/2021 12:56:33 PM By: Levora Dredge Entered By: Levora Dredge on 11/30/2021 09:29:09 DESHANNA, KAMA ([id number]) JAYLIANA, VALENCIA ([id number]) -------------------------------------------------------------------------------- [place] Details Patient Name: LAVORA, BRISBON. Date of Service: 11/30/2021 8:15 AM Medical Record Number: [id number] Patient Account Number: [id number] Date of Birth/Sex: 1946-05-24 (75 y.o. F) Treating RN: Levora Dredge Primary Care Kerigan Narvaez: Priscille Kluver Other Clinician: Referring Alvah Gilder: Orvis Brill, DOCTORS Treating Khristin Keleher/Extender: Yaakov Guthrie in Treatment:  21 Active Inactive Wound/Skin Impairment Nursing Diagnoses: Impaired tissue integrity Goals: Patient/caregiver will verbalize understanding of skin care regimen Date Initiated: 07/06/2021 Date Inactivated: 07/27/2021 Target Resolution Date: 07/06/2021 Goal Status: Met Ulcer/skin breakdown will have a volume reduction of 30% by week 4 Date Initiated: 07/06/2021 Date Inactivated: 10/12/2021 Target Resolution Date: 08/06/2021 Goal Status: Unmet Unmet Reason: cont tx Ulcer/skin breakdown will have a volume reduction of 50% by week 8 Date Initiated: 07/06/2021 Target Resolution Date: 09/06/2021 Goal Status: Active Ulcer/skin breakdown will have a volume reduction of 80% by week 12 Date Initiated: 07/06/2021 Target Resolution Date: 10/06/2021 Goal Status: Active Ulcer/skin breakdown will  heal within 14 weeks Date Initiated: 07/06/2021 Target Resolution Date: 11/06/2021 Goal Status: Active Interventions: Assess patient/caregiver ability to obtain necessary supplies Assess patient/caregiver ability to perform ulcer/skin care regimen upon admission and as needed Assess ulceration(s) every visit Treatment Activities: Referred to DME Shanette Tamargo for dressing supplies : 07/06/2021 Skin care regimen initiated : 07/06/2021 Notes: Electronic Signature(s) Signed: 11/30/2021 12:56:33 PM By: Levora Dredge Entered By: Levora Dredge on 11/30/2021 09:28:46 Laura Santiago ([id number]) -------------------------------------------------------------------------------- Pain Assessment Details Patient Name: Laura Santiago. Date of Service: 11/30/2021 8:15 AM Medical Record Number: [id number] Patient Account Number: [id number] Date of Birth/Sex: November 24, 1946 (75 y.o. F) Treating RN: Levora Dredge Primary Care Caidyn Henricksen: Orvis Brill, DOCTORS Other Clinician: Referring Annalise Mcdiarmid: Orvis Brill, DOCTORS Treating Doaa Kendzierski/Extender: Yaakov Guthrie in Treatment: 21 Active Problems Location of Pain Severity and Description of Pain Patient Has Paino No Site Locations Rate the pain. Current Pain Level: 0 Pain Management and Medication Current Pain Management: Electronic Signature(s) Signed: 11/30/2021 12:56:33 PM By: Levora Dredge Entered By: Levora Dredge on 11/30/2021 08:56:43 ADRIONA, KANEY ([id number]) -------------------------------------------------------------------------------- Patient/Caregiver Education Details Patient Name: Laura Santiago. Date of Service: 11/30/2021 8:15 AM Medical Record Number: [id number] Patient Account Number: [id number] Date of Birth/Gender: 08/11/1946 (75 y.o. F) Treating RN: Levora Dredge Primary Care Physician: Orvis Brill, DOCTORS Other Clinician: Referring Physician: Orvis Brill, DOCTORS Treating Physician/Extender: Yaakov Guthrie in Treatment: 21 Education Assessment Education Provided To: Patient Education Topics Provided Wound/Skin Impairment: Handouts: Caring for Your Ulcer Methods: Explain/Verbal Responses: State content correctly Electronic Signature(s) Signed: 11/30/2021 12:56:33 PM By: Levora Dredge Entered By: Levora Dredge on 11/30/2021 09:58:09 Laura Santiago ([id number]) -------------------------------------------------------------------------------- Wound Assessment Details Patient Name: Laura Santiago. Date of Service: 11/30/2021 8:15 AM Medical Record Number: [id number] Patient Account Number: [id number] Date of Birth/Sex: Oct 27, 1946 (75 y.o. F) Treating RN: Levora Dredge Primary Care Camilla Skeen: Orvis Brill, DOCTORS Other Clinician: Referring Berlin Mokry: HOUSECALLS, DOCTORS Treating Anselmo Reihl/Extender: Yaakov Guthrie in Treatment: 21 Wound Status Wound Number: 1 Primary Venous Leg Ulcer Etiology: Wound Location: Left, Medial Lower Leg Wound Status: Open Wounding Event: Gradually Appeared Comorbid Hypertension, Osteoarthritis, Received Chemotherapy, Date Acquired: 04/06/2021 History: Received Radiation Weeks Of Treatment: 21 Clustered Wound: Yes Photos Wound Measurements Length: (cm) 9.2 Width: (cm) 10 Depth: (cm) 0.3 Area: (cm) 72.257 Volume: (cm) 21.677 % Reduction in Area: 20% % Reduction in Volume: -20% Epithelialization: Medium (34-66%) Tunneling: No Undermining: No Wound Description Classification: Full Thickness Without Exposed Support Structu Exudate Amount: Large Exudate Type: Serosanguineous Exudate Color: red, brown res Foul Odor After Cleansing: No Slough/Fibrino Yes Wound Bed Granulation Amount: Medium (34-66%) Exposed Structure Granulation Quality: Red, Pink Fascia Exposed: No Necrotic Amount: Medium (34-66%) Fat Layer (Subcutaneous Tissue) Exposed: Yes Necrotic Quality: Adherent Slough Tendon Exposed: No Muscle Exposed:  No Joint Exposed: No Bone Exposed: No Treatment Notes Wound #1 (Lower Leg) Wound Laterality: Left, Medial Cleanser Soap and Water Discharge Instruction: Gently  cleanse wound with antibacterial soap, rinse and pat dry prior to dressing wounds Peri-Wound Care Desitin Maximum Strength Ointment 4 (oz) VELECIA, OVITT ([id number]) Discharge Instruction: Apply periwound Topical Primary Dressing Cutimed Sorbact 1.5x 2.38 (in/in) Discharge Instruction: DAKINS MOISTENED- Secondary Dressing Xtrasorb Large 6x9 (in/in) Discharge Instruction: Apply to wound as directed. Do not cut. Secured With Compression Wrap Medichoice 4 layer Compression System, 35-40 mmHG Discharge Instruction: Apply multi-layer wrap as directed. Compression Stockings Add-Ons Electronic Signature(s) Signed: 11/30/2021 12:56:33 PM By: Levora Dredge Entered By: Levora Dredge on 11/30/2021 09:11:52 TALENE, GLASTETTER ([id number]) -------------------------------------------------------------------------------- Wound Assessment Details Patient Name: DESTYN, PARFITT. Date of Service: 11/30/2021 8:15 AM Medical Record Number: [id number] Patient Account Number: [id number] Date of Birth/Sex: [birth year] (75 y.o. F) Treating RN: Levora Dredge Primary Care Keaira Whitehurst: Orvis Brill, DOCTORS Other Clinician: Referring Dorris Pierre: HOUSECALLS, DOCTORS Treating Sharda Keddy/Extender: Yaakov Guthrie in Treatment: 21 Wound Status Wound Number: 2 Primary Neuropathic Ulcer-Non Diabetic Etiology: Wound Location: Right Toe Great Wound Status: Open Wounding Event: Gradually Appeared Comorbid Hypertension, Osteoarthritis, Received Chemotherapy, Date Acquired: 08/24/2021 History: Received Radiation Weeks Of Treatment: 14 Clustered Wound: No Photos Wound Measurements Length: (cm) 0.1 Width: (cm) 0.1 Depth: (cm) 0.1 Area: (cm) 0.008 Volume: (cm) 0.001 % Reduction in Area: 94.9% % Reduction in Volume:  98.4% Epithelialization: None Tunneling: No Undermining: No Wound Description Classification: Full Thickness Without Exposed Support Structures Wound Margin: Thickened Exudate Amount: None Present Foul Odor After Cleansing: No Slough/Fibrino Yes Wound Bed Granulation Amount: Small (1-33%) Exposed Structure Granulation Quality: Red Fascia Exposed: No Necrotic Amount: Medium (34-66%) Fat Layer (Subcutaneous Tissue) Exposed: Yes Necrotic Quality: Eschar Tendon Exposed: No Muscle Exposed: No Joint Exposed: No Bone Exposed: No Treatment Notes Wound #2 (Toe Great) Wound Laterality: Right Cleanser Wound Cleanser Discharge Instruction: Wash your hands with soap and water. Remove old dressing, discard into plastic bag and place into trash. Cleanse the wound with Wound Cleanser prior to applying a clean dressing using gauze sponges, not tissues or cotton balls. Do not scrub or use excessive force. Pat dry using gauze sponges, not tissue or cotton balls. Peri-Wound Care KELANI, ROBART ([id number]) Topical Primary Dressing Silvercel Small 2x2 (in/in) Discharge Instruction: Apply Silvercel Small 2x2 (in/in) as instructed Secondary Dressing Gauze Discharge Instruction: Cover with dry gauze Secured With 61M [place] Surgical Tape, 2x2 (in/yd) Discharge Instruction: Secure dressing Compression Wrap Compression Stockings Add-Ons Electronic Signature(s) Signed: 11/30/2021 12:56:33 PM By: Levora Dredge Entered By: Levora Dredge on 11/30/2021 09:13:27 Laura Santiago ([id number]) -------------------------------------------------------------------------------- Vitals Details Patient Name: Laura Santiago. Date of Service: 11/30/2021 8:15 AM Medical Record Number: [id number] Patient Account Number: [id number] Date of Birth/Sex: Apr 25, 1946 (75 y.o. F) Treating RN: Levora Dredge Primary Care Kenyana Husak: Orvis Brill, DOCTORS Other Clinician: Referring Rickard Kennerly:  HOUSECALLS, DOCTORS Treating Marysue Fait/Extender: Yaakov Guthrie in Treatment: 21 Vital Signs Time Taken: 08:54 Temperature (F): 98 Height (in): 66 Pulse (bpm): 96 Weight (lbs): 153 Respiratory Rate (breaths/min): 18 Body Mass Index (BMI): 24.7 Blood Pressure (mmHg): 130/67 Reference Range: 80 - 120 mg / dl Electronic Signature(s) Signed: 11/30/2021 12:56:33 PM By: Levora Dredge Entered By: Levora Dredge on 11/30/2021 08:56:33

## 2021-12-01 ENCOUNTER — Encounter: Payer: Self-pay | Admitting: Oncology

## 2021-12-01 NOTE — Progress Notes (Signed)
She states she is having feelings of hopelessness due to her diagnosis. Would like copy of her MRI.

## 2021-12-02 ENCOUNTER — Encounter: Payer: Self-pay | Admitting: Emergency Medicine

## 2021-12-02 ENCOUNTER — Inpatient Hospital Stay: Payer: Medicare Other

## 2021-12-02 ENCOUNTER — Other Ambulatory Visit: Payer: Self-pay

## 2021-12-02 ENCOUNTER — Emergency Department: Payer: Medicare Other

## 2021-12-02 ENCOUNTER — Inpatient Hospital Stay
Admission: EM | Admit: 2021-12-02 | Discharge: 2021-12-08 | DRG: 872 | Disposition: A | Discharge status: Skilled Nursing Facility | Payer: Medicare Other | Source: Skilled Nursing Facility | Attending: Internal Medicine | Admitting: Internal Medicine

## 2021-12-02 ENCOUNTER — Telehealth: Payer: Self-pay | Admitting: *Deleted

## 2021-12-02 ENCOUNTER — Inpatient Hospital Stay: Payer: Medicare Other | Admitting: Oncology

## 2021-12-02 DIAGNOSIS — C7951 Secondary malignant neoplasm of bone: Secondary | ICD-10-CM | POA: Diagnosis present

## 2021-12-02 DIAGNOSIS — Z833 Family history of diabetes mellitus: Secondary | ICD-10-CM

## 2021-12-02 DIAGNOSIS — C50911 Malignant neoplasm of unspecified site of right female breast: Secondary | ICD-10-CM | POA: Diagnosis present

## 2021-12-02 DIAGNOSIS — L97222 Non-pressure chronic ulcer of left calf with fat layer exposed: Secondary | ICD-10-CM | POA: Diagnosis present

## 2021-12-02 DIAGNOSIS — Z888 Allergy status to other drugs, medicaments and biological substances status: Secondary | ICD-10-CM

## 2021-12-02 DIAGNOSIS — I878 Other specified disorders of veins: Secondary | ICD-10-CM | POA: Diagnosis present

## 2021-12-02 DIAGNOSIS — I83022 Varicose veins of left lower extremity with ulcer of calf: Secondary | ICD-10-CM | POA: Diagnosis present

## 2021-12-02 DIAGNOSIS — M11261 Other chondrocalcinosis, right knee: Secondary | ICD-10-CM | POA: Diagnosis present

## 2021-12-02 DIAGNOSIS — M1711 Unilateral primary osteoarthritis, right knee: Secondary | ICD-10-CM | POA: Diagnosis present

## 2021-12-02 DIAGNOSIS — N2581 Secondary hyperparathyroidism of renal origin: Secondary | ICD-10-CM | POA: Diagnosis present

## 2021-12-02 DIAGNOSIS — D509 Iron deficiency anemia, unspecified: Secondary | ICD-10-CM | POA: Diagnosis present

## 2021-12-02 DIAGNOSIS — Z79899 Other long term (current) drug therapy: Secondary | ICD-10-CM

## 2021-12-02 DIAGNOSIS — M009 Pyogenic arthritis, unspecified: Secondary | ICD-10-CM | POA: Diagnosis present

## 2021-12-02 DIAGNOSIS — Z85819 Personal history of malignant neoplasm of unspecified site of lip, oral cavity, and pharynx: Secondary | ICD-10-CM

## 2021-12-02 DIAGNOSIS — E872 Acidosis, unspecified: Secondary | ICD-10-CM

## 2021-12-02 DIAGNOSIS — S83241A Other tear of medial meniscus, current injury, right knee, initial encounter: Secondary | ICD-10-CM | POA: Diagnosis present

## 2021-12-02 DIAGNOSIS — Z7901 Long term (current) use of anticoagulants: Secondary | ICD-10-CM

## 2021-12-02 DIAGNOSIS — Z882 Allergy status to sulfonamides status: Secondary | ICD-10-CM

## 2021-12-02 DIAGNOSIS — I129 Hypertensive chronic kidney disease with stage 1 through stage 4 chronic kidney disease, or unspecified chronic kidney disease: Secondary | ICD-10-CM | POA: Diagnosis present

## 2021-12-02 DIAGNOSIS — M8580 Other specified disorders of bone density and structure, unspecified site: Secondary | ICD-10-CM | POA: Diagnosis present

## 2021-12-02 DIAGNOSIS — A419 Sepsis, unspecified organism: Principal | ICD-10-CM | POA: Diagnosis present

## 2021-12-02 DIAGNOSIS — Z20822 Contact with and (suspected) exposure to covid-19: Secondary | ICD-10-CM | POA: Diagnosis present

## 2021-12-02 DIAGNOSIS — Z66 Do not resuscitate: Secondary | ICD-10-CM | POA: Diagnosis present

## 2021-12-02 DIAGNOSIS — R6510 Systemic inflammatory response syndrome (SIRS) of non-infectious origin without acute organ dysfunction: Secondary | ICD-10-CM

## 2021-12-02 DIAGNOSIS — M2341 Loose body in knee, right knee: Secondary | ICD-10-CM | POA: Diagnosis present

## 2021-12-02 DIAGNOSIS — M25561 Pain in right knee: Secondary | ICD-10-CM

## 2021-12-02 DIAGNOSIS — S83271A Complex tear of lateral meniscus, current injury, right knee, initial encounter: Secondary | ICD-10-CM | POA: Diagnosis present

## 2021-12-02 DIAGNOSIS — I10 Essential (primary) hypertension: Secondary | ICD-10-CM

## 2021-12-02 DIAGNOSIS — N1832 Chronic kidney disease, stage 3b: Secondary | ICD-10-CM | POA: Diagnosis present

## 2021-12-02 DIAGNOSIS — Z800 Family history of malignant neoplasm of digestive organs: Secondary | ICD-10-CM

## 2021-12-02 DIAGNOSIS — Z808 Family history of malignant neoplasm of other organs or systems: Secondary | ICD-10-CM

## 2021-12-02 DIAGNOSIS — R739 Hyperglycemia, unspecified: Secondary | ICD-10-CM | POA: Diagnosis present

## 2021-12-02 DIAGNOSIS — Z823 Family history of stroke: Secondary | ICD-10-CM

## 2021-12-02 DIAGNOSIS — Z515 Encounter for palliative care: Secondary | ICD-10-CM | POA: Diagnosis not present

## 2021-12-02 DIAGNOSIS — D631 Anemia in chronic kidney disease: Secondary | ICD-10-CM | POA: Diagnosis present

## 2021-12-02 DIAGNOSIS — M542 Cervicalgia: Secondary | ICD-10-CM | POA: Diagnosis present

## 2021-12-02 DIAGNOSIS — C50919 Malignant neoplasm of unspecified site of unspecified female breast: Secondary | ICD-10-CM | POA: Diagnosis present

## 2021-12-02 DIAGNOSIS — L03115 Cellulitis of right lower limb: Secondary | ICD-10-CM | POA: Diagnosis present

## 2021-12-02 DIAGNOSIS — F909 Attention-deficit hyperactivity disorder, unspecified type: Secondary | ICD-10-CM | POA: Diagnosis present

## 2021-12-02 DIAGNOSIS — Z886 Allergy status to analgesic agent status: Secondary | ICD-10-CM

## 2021-12-02 DIAGNOSIS — Z86718 Personal history of other venous thrombosis and embolism: Secondary | ICD-10-CM

## 2021-12-02 DIAGNOSIS — Z8249 Family history of ischemic heart disease and other diseases of the circulatory system: Secondary | ICD-10-CM

## 2021-12-02 LAB — CBC WITH DIFFERENTIAL/PLATELET
Abs Immature Granulocytes: 0.07 10*3/uL (ref 0.00–0.07)
Basophils Absolute: 0 10*3/uL (ref 0.0–0.1)
Basophils Relative: 0 %
Eosinophils Absolute: 0 10*3/uL (ref 0.0–0.5)
Eosinophils Relative: 0 %
HCT: 33.7 % — ABNORMAL LOW (ref 36.0–46.0)
Hemoglobin: 10.3 g/dL — ABNORMAL LOW (ref 12.0–15.0)
Immature Granulocytes: 1 %
Lymphocytes Relative: 4 %
Lymphs Abs: 0.4 10*3/uL — ABNORMAL LOW (ref 0.7–4.0)
MCH: 27.7 pg (ref 26.0–34.0)
MCHC: 30.6 g/dL (ref 30.0–36.0)
MCV: 90.6 fL (ref 80.0–100.0)
Monocytes Absolute: 0.7 10*3/uL (ref 0.1–1.0)
Monocytes Relative: 7 %
Neutro Abs: 9.5 10*3/uL — ABNORMAL HIGH (ref 1.7–7.7)
Neutrophils Relative %: 88 %
Platelets: 272 10*3/uL (ref 150–400)
RBC: 3.72 MIL/uL — ABNORMAL LOW (ref 3.87–5.11)
RDW: 16.2 % — ABNORMAL HIGH (ref 11.5–15.5)
WBC: 10.7 10*3/uL — ABNORMAL HIGH (ref 4.0–10.5)
nRBC: 0 % (ref 0.0–0.2)

## 2021-12-02 LAB — SYNOVIAL CELL COUNT + DIFF, W/ CRYSTALS
Eosinophils-Synovial: 0 %
Lymphocytes-Synovial Fld: 0 %
Monocyte-Macrophage-Synovial Fluid: 5 %
Neutrophil, Synovial: 95 %
WBC, Synovial: 60425 /mm3 — ABNORMAL HIGH (ref 0–200)

## 2021-12-02 LAB — URINALYSIS, ROUTINE W REFLEX MICROSCOPIC
Bilirubin Urine: NEGATIVE
Glucose, UA: NEGATIVE mg/dL
Ketones, ur: 15 mg/dL — AB
Leukocytes,Ua: NEGATIVE
Nitrite: NEGATIVE
Protein, ur: 30 mg/dL — AB
Specific Gravity, Urine: 1.015 (ref 1.005–1.030)
Squamous Epithelial / HPF: NONE SEEN (ref 0–5)
pH: 6 (ref 5.0–8.0)

## 2021-12-02 LAB — COMPREHENSIVE METABOLIC PANEL
ALT: 11 U/L (ref 0–44)
AST: 22 U/L (ref 15–41)
Albumin: 3.7 g/dL (ref 3.5–5.0)
Alkaline Phosphatase: 71 U/L (ref 38–126)
Anion gap: 8 (ref 5–15)
BUN: 18 mg/dL (ref 8–23)
CO2: 24 mmol/L (ref 22–32)
Calcium: 8.2 mg/dL — ABNORMAL LOW (ref 8.9–10.3)
Chloride: 104 mmol/L (ref 98–111)
Creatinine, Ser: 1.33 mg/dL — ABNORMAL HIGH (ref 0.44–1.00)
GFR, Estimated: 41 mL/min — ABNORMAL LOW (ref >60)
Glucose, Bld: 125 mg/dL — ABNORMAL HIGH (ref 70–99)
Potassium: 4 mmol/L (ref 3.5–5.1)
Sodium: 136 mmol/L (ref 135–145)
Total Bilirubin: 0.9 mg/dL (ref 0.3–1.2)
Total Protein: 7.5 g/dL (ref 6.5–8.1)

## 2021-12-02 LAB — RESP PANEL BY RT-PCR (FLU A&B, COVID) ARPGX2
Influenza A by PCR: NEGATIVE
Influenza B by PCR: NEGATIVE
SARS Coronavirus 2 by RT PCR: NEGATIVE

## 2021-12-02 LAB — LACTIC ACID, PLASMA
Lactic Acid, Venous: 2.1 mmol/L (ref 0.5–1.9)
Lactic Acid, Venous: 0.9 mmol/L (ref 0.5–1.9)

## 2021-12-02 MED ORDER — LACTATED RINGERS IV BOLUS (SEPSIS)
1000.0000 mL | Freq: Once | INTRAVENOUS | Status: AC
  Administered 2021-12-02: 16:00:00 1000 mL via INTRAVENOUS

## 2021-12-02 MED ORDER — LIDOCAINE HCL (PF) 1 % IJ SOLN
INTRAMUSCULAR | Status: AC
  Administered 2021-12-02: 16:00:00 30 mL
  Filled 2021-12-02: qty 15

## 2021-12-02 MED ORDER — LACTATED RINGERS IV BOLUS (SEPSIS)
250.0000 mL | Freq: Once | INTRAVENOUS | Status: AC
  Administered 2021-12-02: 16:00:00 250 mL via INTRAVENOUS

## 2021-12-02 MED ORDER — VANCOMYCIN HCL 1500 MG/300ML IV SOLN
1500.0000 mg | Freq: Once | INTRAVENOUS | Status: AC
  Administered 2021-12-02: 16:00:00 1500 mg via INTRAVENOUS
  Filled 2021-12-02: qty 300

## 2021-12-02 MED ORDER — SODIUM CHLORIDE 0.9 % IV SOLN
2.0000 g | Freq: Once | INTRAVENOUS | Status: AC
  Administered 2021-12-02: 16:00:00 2 g via INTRAVENOUS
  Filled 2021-12-02: qty 2

## 2021-12-02 MED ORDER — VANCOMYCIN HCL 750 MG/150ML IV SOLN
750.0000 mg | INTRAVENOUS | Status: DC
  Administered 2021-12-03 – 2021-12-05 (×3): 750 mg via INTRAVENOUS
  Filled 2021-12-02 (×5): qty 150

## 2021-12-02 MED ORDER — METRONIDAZOLE 500 MG/100ML IV SOLN
500.0000 mg | Freq: Once | INTRAVENOUS | Status: AC
  Administered 2021-12-02: 16:00:00 500 mg via INTRAVENOUS
  Filled 2021-12-02: qty 100

## 2021-12-02 MED ORDER — SODIUM CHLORIDE 0.9 % IV SOLN: Freq: Once | INTRAVENOUS | Status: AC

## 2021-12-02 MED ORDER — ACETAMINOPHEN 325 MG PO TABS
650.0000 mg | ORAL_TABLET | Freq: Four times a day (QID) | ORAL | Status: DC | PRN
  Administered 2021-12-04: 03:00:00 650 mg via ORAL
  Filled 2021-12-02 (×2): qty 2

## 2021-12-02 MED ORDER — VANCOMYCIN HCL IN DEXTROSE 1-5 GM/200ML-% IV SOLN: 1000.0000 mg | Freq: Once | INTRAVENOUS | Status: DC

## 2021-12-02 MED ORDER — ACETAMINOPHEN 650 MG RE SUPP
650.0000 mg | Freq: Four times a day (QID) | RECTAL | Status: DC | PRN
  Filled 2021-12-02: qty 1

## 2021-12-02 MED ORDER — LIDOCAINE HCL (PF) 1 % IJ SOLN
30.0000 mL | Freq: Once | INTRAMUSCULAR | Status: AC
  Filled 2021-12-02: qty 30

## 2021-12-02 MED ORDER — ENOXAPARIN SODIUM 40 MG/0.4ML IJ SOSY: 40.0000 mg | PREFILLED_SYRINGE | INTRAMUSCULAR | Status: DC

## 2021-12-02 NOTE — ED Provider Notes (Signed)
[facility] Emergency Department Provider Note   ____________________________________________   None    (approximate)  I have reviewed the triage vital signs and the nursing notes.   HISTORY  Chief Complaint Knee Pain and Neck Pain    HPI Laura Santiago is a 75 y.o. female with past medical history of hypertension, CKD, anemia, and metastatic breast cancer who presents to the ED complaining of knee pain.  History is limited due to patient's confusion and majority of history is obtained from patient's daughter at bedside.  She states that over the past 2 days, the patient has seemed increasingly confused.  At one point, she complained of pain in her neck but daughter is not aware of any recent falls.  Over the past 24 hours, she has been complaining of increasing pain around her right knee and daughter noticed significant swelling at that knee.  She has a chronic wound to her left lower extremity with dressing in place.  Patient denies any abdominal pain, dysuria, cough, chest pain, or shortness of breath.  Patient and family have not noticed any fevers.  Patient currently lives alone at a retirement facility.        Past Medical History:  Diagnosis Date   ADHD (attention deficit hyperactivity disorder)    in [place], no date on md note   Anemia    IDA 11/26/2019, Anemia in stage 4 chronic kidney disease ([place]) 09/03/2020   Arthritis    osteoarthritis right knee 09/30/2014   Breast cancer ([place]) 10/05/2013   in [place], [place] -, Her 2 is 3+   Cancer related pain 03/28/2021   in [name], md notes spine mets   DVT of lower extremity, bilateral ([place]) 03/30/2014   in [name]   Generalized muscle weakness 03/31/2016   in [name]   GERD (gastroesophageal reflux disease) 05/15/2013   per md in [name]   Hyperparathyroidism, secondary ([place]) 04/25/2018   in [name]   Hypertension 02/20/2016   info from MD in [facility]   Lumbar compression fracture ([place]) 03/18/2021   in [place] loss 02/05/2015   in [name]   Metabolic acidosis 19/37/9024   in [place]   Metabolic syndrome 09/73/5329   in [name]   Osteopenia after menopause 03/29/2016   in [place] of both eyes 09/30/2012   per md in [name] where pt. 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   Malignant neoplasm metastatic to bone ([place]) 04/27/2021   Primary insomnia 03/28/2021   Unsteady gait 03/28/2021   Lumbar compression fracture, open, initial encounter ([place]) 03/18/2021   Posterior vitreous detachment of both eyes 02/08/2021   Anemia in stage 4 chronic kidney disease ([place]) 09/03/2020   AKI (acute kidney injury) ([place]) 06/26/2020   Bacteriuria 11/26/2019   Iron deficiency anemia 11/26/2019   Venous stasis ulcer of left calf limited to breakdown of skin with varicose veins ([place]) 10/22/2018   Myopia of both eyes with astigmatism and presbyopia 05/27/2018   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 05/27/2018   Hyperparathyroidism, secondary ([place]) 04/25/2018   Acidosis, metabolic 65/46/5035   Generalized muscle weakness 03/31/2016   Attention deficit hyperactivity disorder (ADHD), predominantly inattentive type 03/29/2016   Osteopenia 03/29/2016   Hypertension 46/56/8127   Metabolic syndrome 51/70/0174   Chronic anticoagulation 11/16/2015   Right leg swelling 11/16/2015   Memory loss 02/05/2015   Atrophy of muscle of multiple sites 09/30/2014   Physical deconditioning 09/30/2014   Primary osteoarthritis of right knee 09/30/2014    History of breast cancer in female 09/14/2014   History of radiation therapy 09/14/2014   Acute embolism and thrombosis of unspecified deep veins of lower extremity, bilateral ([place]) 03/30/2014   Lymphadenopathy, inguinal 08/28/2013   Venous ulcer of ankle, left ([place]) 08/04/2013   GERD (gastroesophageal reflux disease) 06/02/2013    Past Surgical History:  Procedure Laterality Date   CESAREAN SECTION     unknown   fibroid removed  N/A    in [place] - [place] date   IR FLUORO GUIDE CV LINE LEFT  07/27/2021   IR PORT REPAIR CENTRAL VENOUS ACCESS DEVICE Left    In [place] N/A 02/25/2014   in [name]   ovary removed      unknown   [place] CATARACT EXTRACAP,INSERT LENS Bilateral  Bilateral 09/05/2012   in [place]     unknown    LUMPECTOMY Right 04/03/2014   in [place]    Prior to Admission medications   Medication Sig Start Date End Date Taking? Authorizing Provider  acetaminophen (TYLENOL) 500 MG tablet Take 500-1,000 mg by mouth every 6 (six) hours as needed for mild pain or moderate pain.   Yes [provider]  amphetamine-dextroamphetamine (ADDERALL XR) 30 MG 24 hr capsule Take 1 capsule (30 mg total) by mouth daily. 11/15/21  Yes [name], MD  Calcium 200 MG TABS Take 1 tablet by mouth daily.   Yes [provider]  fentaNYL (DURAGESIC) 12 MCG/HR Place 1 patch onto the skin every 3 (three) days. 1 patch to skin 11/15/21  Yes [name], MD  lisinopril (ZESTRIL) 20 MG tablet Take 20 mg by mouth daily.   Yes [provider]  LORazepam (ATIVAN) 0.5 MG tablet Take 1 tablet (0.5 mg total) by mouth daily as needed for anxiety. 10/21/21  Yes [name], MD  Multiple Vitamins-Minerals (MULTIVITAMIN ADULTS) TABS Take 1 tablet by mouth daily.   Yes [provider]  Oxycodone HCl 10 MG TABS TAKE ONE TABLET BY MOUTH EVERY 4 HOURS AS NEEDED 11/25/21  Yes Verlon Au, NP  sodium hypochlorite (DAKIN'S 1/4 STRENGTH) 0.125 % SOLN  Apply 1 application topically as directed. Moisten gauze with solution and wrap wound   Yes [provider]  terbinafine (LAMISIL AT) 1 % cream Apply 1 application topically 2 (two) times daily. Patient taking differently: Apply 1 application topically 2 (two) times daily as needed (fungal treatment). 08/29/21  Yes Rosiland Oz, MD  potassium chloride SA (KLOR-CON) 20 MEQ tablet Take 1 tablet (20 mEq total) by mouth daily. Patient not taking: Reported on 11/29/2021 09/09/21   [name], MD  rivaroxaban (XARELTO) 20 MG TABS tablet Take 1 tablet (20 mg total) by mouth daily with supper. Patient not taking: Reported on 12/01/2021 11/29/21   Kris Hartmann, NP  zolpidem (AMBIEN) 5 MG tablet Take 1 tablet (5 mg total) by mouth at bedtime. Patient not taking:  Reported on 12/02/2021 09/30/21   [name], MD    Allergies Corticosteroids, Sulfa antibiotics, and Celebrex [celecoxib]  Family History  Problem Relation Age of Onset   Pancreatic cancer Mother    Stroke Father    Diabetes Father    Hypertension Father    Heart disease Father    Skin cancer Father    Varicose Veins Father    Skin cancer Brother    Cancer - Prostate Brother     Social History Social History   Tobacco Use   Smoking status: Never   Smokeless tobacco: Never  Vaping Use   Vaping Use: Never used  Substance Use Topics   Alcohol use: Not Currently   Drug use: Never    Review of Systems  Constitutional: No fever/chills Eyes: No visual changes. ENT: No sore throat. Cardiovascular: Denies chest pain. Respiratory: Denies shortness of breath. Gastrointestinal: No abdominal pain.  No nausea, no vomiting.  No diarrhea.  No constipation. Genitourinary: Negative for dysuria. Musculoskeletal: Negative for back pain.  Positive for neck pain and right knee pain. Skin: Negative for rash. Neurological: Negative for headaches, focal weakness or  numbness.  ____________________________________________   PHYSICAL EXAM:  VITAL SIGNS: ED Triage Vitals  Enc Vitals Group     BP 12/02/21 1017 (!) 158/85     Pulse Rate 12/02/21 1017 (!) 110     Resp 12/02/21 1017 (!) 24     Temp 12/02/21 1017 100.3 F (37.9 C)     Temp Source 12/02/21 1017 Oral     SpO2 12/02/21 1017 92 %     Weight 12/02/21 1011 153 lb (69.4 kg)     Height 12/02/21 1011 5\' 2"  (1.575 m)     Head Circumference --      Peak Flow --      Pain Score 12/02/21 1010 10     Pain Loc --      Pain Edu? --      Excl. in [place]? --     Constitutional: Awake and alert. Eyes: Conjunctivae are normal. Head: Atraumatic. Nose: No congestion/rhinnorhea. Mouth/Throat: Mucous membranes are dry. Neck: Normal ROM, no midline cervical spine tenderness to palpation. Cardiovascular: Tachycardic, regular rhythm. Grossly normal heart sounds.  2+ DP pulses bilaterally. Respiratory: Normal respiratory effort.  No retractions. Lungs CTAB. Gastrointestinal: Soft and nontender. No distention. Genitourinary: deferred Musculoskeletal: Edema and warmth noted to right knee with severe pain with any range of motion of right knee.  Erythema and warmth distally noted from mid shin to dorsum of right foot. Neurologic:  Normal speech and language. No gross focal neurologic deficits are appreciated. Skin:  Skin is warm, dry and intact. No rash noted. Psychiatric: Mood and affect are normal. Speech and behavior are normal.  ____________________________________________   LABS (all labs ordered are listed, but only abnormal results are displayed)  Labs Reviewed  LACTIC ACID, PLASMA - Abnormal; Notable for the following components:      Result Value   Lactic Acid, Venous 2.1 (*)    All other components within normal limits  COMPREHENSIVE METABOLIC PANEL - Abnormal; Notable for the following components:   Glucose, Bld 125 (*)    Creatinine, Ser 1.33 (*)    Calcium 8.2 (*)    GFR, Estimated 41  (*)    All other components within normal limits  CBC WITH DIFFERENTIAL/PLATELET - Abnormal; Notable for the following components:   WBC 10.7 (*)    RBC 3.72 (*)    Hemoglobin 10.3 (*)  HCT 33.7 (*)    RDW 16.2 (*)    Neutro Abs 9.5 (*)    Lymphs Abs 0.4 (*)    All other components within normal limits  RESP PANEL BY RT-PCR (FLU A&B, COVID) ARPGX2  CULTURE, BLOOD (ROUTINE X 2)  CULTURE, BLOOD (ROUTINE X 2)  BODY FLUID CULTURE W GRAM STAIN  LACTIC ACID, PLASMA  URINALYSIS, ROUTINE W REFLEX MICROSCOPIC  GLUCOSE, BODY FLUID OTHER            PROTEIN, BODY FLUID (OTHER)  SYNOVIAL CELL COUNT + DIFF, W/ CRYSTALS    PROCEDURES  Procedure(s) performed (including Critical Care):  .Critical Care Performed by: Blake Divine, MD Authorized by: Blake Divine, MD   Critical care provider statement:    Critical care time (minutes):  45   Critical care time was exclusive of:  Separately billable procedures and treating other patients and teaching time   Critical care was necessary to treat or prevent imminent or life-threatening deterioration of the following conditions:  Sepsis   Critical care was time spent personally by me on the following activities:  Development of treatment plan with patient or surrogate, discussions with consultants, evaluation of patient's response to treatment, examination of patient, ordering and review of laboratory studies, ordering and review of radiographic studies, ordering and performing treatments and interventions, pulse oximetry, re-evaluation of patient's condition and review of old charts   I assumed direction of critical care for this patient from another provider in my specialty: no     Care discussed with: admitting provider   .Joint Aspiration/Arthrocentesis  Date/Time: 12/02/2021 4:06 PM Performed by: Blake Divine, MD Authorized by: Blake Divine, MD   Consent:    Consent obtained:  Verbal   Consent given by:  Patient (Daughter)    Risks, benefits, and alternatives were discussed: yes     Risks discussed:  Bleeding, infection, pain, nerve damage, incomplete drainage and poor cosmetic result   Alternatives discussed:  Delayed treatment and no treatment Universal protocol:    Patient identity confirmed:  Arm band and verbally with patient Location:    Location:  Knee   Knee:  R knee Anesthesia:    Anesthesia method:  Local infiltration   Local anesthetic:  Lidocaine 1% w/o epi Procedure details:    Preparation: Patient was prepped and draped in usual sterile fashion     Needle gauge:  18 G   Ultrasound guidance: no     Approach:  Medial   Aspirate amount:  50   Aspirate characteristics:  Serous, yellow and cloudy   Steroid injected: no     Specimen collected: yes   Post-procedure details:    Dressing:  Adhesive bandage   Procedure completion:  Tolerated well, no immediate complications   ____________________________________________   INITIAL IMPRESSION / ASSESSMENT AND PLAN / ED COURSE      75 year old female with past medical history of hypertension, CKD, anemia, metastatic breast cancer, and DVT who presents to the ED complaining of increasing knee pain and neck pain over the past couple of days.  Patient is awake and alert, but slightly confused and disoriented, vital signs also significant for borderline fever, tachycardia, and tachypnea.  Overall picture is concerning for sepsis and we will start broad-spectrum antibiotics along with IV fluid resuscitation.  She appears to have a cellulitis to her distal right lower extremity, appearance of right knee is also concerning for possible septic arthritis.  Arthrocentesis was performed with cloudy yellow fluid obtained, will send for fluid  studies.  CT head and cervical spine are negative for acute process, chest x-ray reviewed by me and shows no infiltrate, edema, or effusion.  UA is pending, but if arthrocentesis results are unremarkable, source of patient's  sepsis is likely a right lower extremity cellulitis.  Patient turned over to oncoming provider pending additional results and admission.      ____________________________________________   FINAL CLINICAL IMPRESSION(S) / ED DIAGNOSES  Final diagnoses:  Right knee pain  Sepsis without acute organ dysfunction, due to unspecified organism [facility])  Cellulitis of right lower extremity     ED Discharge Orders     None        Note:  This document was prepared using Dragon voice recognition software and may include unintentional dictation errors.    Blake Divine, MD 12/02/21 [phone]

## 2021-12-02 NOTE — Telephone Encounter (Signed)
Patient daughter [name] called reporting that patient has injured her neck and cannot move so she will not be coming for her appointment today. She is asking for patient tp be admitted to hospital and states she will need EMS transport for that. Asking if you can arrange admission. Please advise

## 2021-12-02 NOTE — H&P (Signed)
History and Physical  JONI COLEGROVE ZES:[id number] DOB: 15-May-1946 DOA: 12/02/2021  Referring physician: Blake Divine, MD PCP: [facility]  Patient coming from: Home  Chief Complaint: Right knee pain and neck pain  HPI: Laura Santiago is a 75 y.o. female with medical history significant for DVT on Xarelto, stage IIIb CKD, chronic venous stasis, essential hypertension, metastatic breast cancer (ER weakly +10%, PR negative and HER2 positive) and chronic leg wound who presents to the emergency department due to right knee pain.  Patient was unable to provide history possibly due to being somnolent, though easily arousable, but quickly goes back to sleep.  History was obtained from ED physician and ED medical record.  Per report, 2 days of increased confusion was reported, she complained of neck pain and patient has been complaining of right knee pain within past 24 hours.  Significant swelling was noted at the knee.  There was no report of chest pain, cough, shortness of breath, abdominal pain or fever.  Patient lives alone at a retirement facility.  ED Course:  In the emergency department, she was tachypneic, but hemodynamically stable.  Work-up in the ED showed mild leukocytosis, normocytic anemia, hyperglycemia, BUN/creatinine 18/1.33 (creatinine is within baseline range).  Lactic acid 2.1 > 0.9, synovial fluid had a turbid appearance, WBC was 60,425.  Influenza A, B, SARS coronavirus 2 was negative. CT cervical spine without contrast showed no acute fracture or traumatic malalignment of the cervical spine. CT head without contrast showed no acute intracranial pathology Chest x-ray showed no active cardiopulmonary disease Right knee showed Tricompartmental severe degenerative changes of the right knee with associated joint effusion. Patient was started on IV cefepime, Flagyl and vancomycin.  IV hydration was provided.  Hospitalist was asked to admit patient for further  evaluation and management.  Review of Systems: This cannot be obtained at this time due to patient being somnolent  Past Medical History:  Diagnosis Date   ADHD (attention deficit hyperactivity disorder)    in [place], no date on md note   Anemia    IDA 11/26/2019, Anemia in stage 4 chronic kidney disease ([place]) 09/03/2020   Arthritis    osteoarthritis right knee 09/30/2014   Breast cancer ([place]) 10/05/2013   in [place] +, [place] -, Her 2 is 3+   Cancer related pain 03/28/2021   in [name], md notes spine mets   DVT of lower extremity, bilateral ([place]) 03/30/2014   in [name]   Generalized muscle weakness 03/31/2016   in [name]   GERD (gastroesophageal reflux disease) 05/15/2013   per md in [name]   Hyperparathyroidism, secondary ([place]) 04/25/2018   in [name]   Hypertension 02/20/2016   info from MD in [facility]   Lumbar compression fracture ([place]) 03/18/2021   in [place] loss 02/05/2015   in [name]   Metabolic acidosis 26/33/3545   in [place]   Metabolic syndrome 62/56/3893   in [name]   Osteopenia after menopause 03/29/2016   in [place] of both eyes 09/30/2012   per md in [name] where pt. 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 She has never used smokeless tobacco. She reports that she does not currently use alcohol. She reports that she does not use drugs.   Allergies   Allergen Reactions   Corticosteroids Other (See Comments)    Pt trf from [name] and per primary md for her cancer tx. Notes that it causes agitation intolerance   Sulfa Antibiotics Other (See Comments)    Pt moved from [name] and in MD notes she has allergy but we do not know reactions when taking the drug   Celebrex [Celecoxib] Rash    Family History  Problem Relation Age of Onset   Pancreatic cancer Mother    Stroke Father    Diabetes Father    Hypertension Father    Heart disease Father    Skin cancer Father    Varicose Veins Father    Skin cancer Brother    Cancer - Prostate Brother      Prior to Admission medications   Medication Sig Start Date End Date Taking? Authorizing Provider  acetaminophen (TYLENOL) 500 MG tablet Take 500-1,000 mg by mouth every 6 (six) hours as needed for mild pain or moderate pain.   Yes [provider]  amphetamine-dextroamphetamine (ADDERALL XR) 30 MG 24 hr capsule Take 1 capsule (30 mg total) by mouth daily. 11/15/21  Yes [name], MD  Calcium 200 MG TABS Take 1 tablet by mouth daily.   Yes [provider]  fentaNYL (DURAGESIC) 12 MCG/HR Place 1 patch onto the skin every 3 (three) days. 1 patch to skin 11/15/21  Yes [name], MD  lisinopril (ZESTRIL) 20 MG tablet Take 20 mg by mouth daily.   Yes [provider]  LORazepam (ATIVAN) 0.5 MG tablet Take 1 tablet (0.5 mg total) by mouth daily as needed for anxiety. 10/21/21  Yes [name], MD  Multiple Vitamins-Minerals (MULTIVITAMIN ADULTS) TABS Take 1 tablet by mouth daily.   Yes [provider]  Oxycodone HCl 10 MG TABS TAKE ONE TABLET BY MOUTH EVERY 4 HOURS AS NEEDED 11/25/21  Yes Verlon Au, NP  sodium hypochlorite (DAKIN'S 1/4 STRENGTH) 0.125 % SOLN Apply 1 application topically as directed. Moisten gauze with solution and wrap wound   Yes [provider]  terbinafine (LAMISIL AT) 1 % cream Apply 1 application topically 2 (two) times  daily. Patient taking differently: Apply 1 application topically 2 (two) times daily as needed (fungal treatment). 08/29/21  Yes Rosiland Oz, MD  potassium chloride SA (KLOR-CON) 20 MEQ tablet Take 1 tablet (20 mEq total) by mouth daily. Patient not taking: Reported on 11/29/2021 09/09/21   [name], MD  rivaroxaban (XARELTO) 20 MG TABS tablet Take 1 tablet (20 mg total) by mouth daily with supper. Patient not taking: Reported on 12/01/2021 11/29/21   Kris Hartmann, NP  zolpidem (AMBIEN) 5 MG tablet Take 1 tablet (5 mg total) by mouth at bedtime. Patient not taking: Reported on 12/02/2021 09/30/21   [name], MD    Physical Exam: BP 130/66    Pulse  94    Temp 100.3 F (37.9 C) (Oral)    Resp (!) 22    Ht $R'5\' 2"'LH$  (1.575 m)    Wt 69.4 kg    SpO2 99%    BMI 27.98 kg/m   General: 75 y.o. year-old female ill appearing but in no acute distress.  Normal alignment, though easily arousable  HEENT: NCAT, EOMI Neck: Supple, trachea medial Cardiovascular: Tachycardia.  Regular rate and rhythm with no rubs or gallops.  No thyromegaly or JVD noted.  No lower extremity edema. 2/4 pulses in all 4 extremities. Respiratory: Tachypnea.  Clear to auscultation with no wheezes or rales. Good inspiratory effort. Abdomen: Soft, nontender nondistended with normal bowel sounds x4 quadrants. Muskuloskeletal: Right knee inflammation with restricted ROM due to pain.  Erythema and warmth noted in dorsum of right foot with extension to mid shin.  Left leg with chronic wound with dressing.   Neuro: CN II-XII intact, strength 5/5 x 4, sensation, reflexes intact Skin: No ulcerative lesions noted or rashes Psychiatry: Mood is appropriate for condition and setting          Labs on Admission:  Basic Metabolic Panel: Recent Labs  Lab 12/02/21 1018  NA 136  K 4.0  CL 104  CO2 24  GLUCOSE 125*  BUN 18  CREATININE 1.33*  CALCIUM 8.2*   Liver Function Tests: Recent Labs  Lab 12/02/21 1018  AST 22   ALT 11  ALKPHOS 71  BILITOT 0.9  PROT 7.5  ALBUMIN 3.7   No results for input(s): LIPASE, AMYLASE in the last 168 hours. No results for input(s): AMMONIA in the last 168 hours. CBC: Recent Labs  Lab 12/02/21 1018  WBC 10.7*  NEUTROABS 9.5*  HGB 10.3*  HCT 33.7*  MCV 90.6  PLT 272   Cardiac Enzymes: No results for input(s): CKTOTAL, CKMB, CKMBINDEX, TROPONINI in the last 168 hours.  BNP (last 3 results) No results for input(s): BNP in the last 8760 hours.  ProBNP (last 3 results) No results for input(s): PROBNP in the last 8760 hours.  CBG: No results for input(s): GLUCAP in the last 168 hours.  Radiological Exams on Admission: DG Chest 2 View  Result Date: 12/02/2021 CLINICAL DATA:  Sepsis EXAM: CHEST - 2 VIEW COMPARISON:  07/01/2021 FINDINGS: Left-sided Port-A-Cath in stable positioning. The heart size and mediastinal contours are within normal limits. Chronic elevation of the right hemidiaphragm. No focal airspace consolidation, pleural effusion, or pneumothorax. No appreciable interval change in appearance of sclerotic osseous metastatic disease. IMPRESSION: No active cardiopulmonary disease. Electronically Signed   By: Davina Poke D.O.   On: 12/02/2021 15:12   DG Knee 2 Views Right  Result Date: 12/02/2021 CLINICAL DATA:  Pain and swelling EXAM: RIGHT KNEE - 1-2 VIEW COMPARISON:  None. FINDINGS: No evidence of fracture or dislocation. At least moderate volume joint effusion. Severe tricompartmental degenerative changes. No aggressive appearing focal bone abnormality. Soft tissues are unremarkable. IMPRESSION: 1. Tricompartmental severe degenerative changes of the right knee with associated joint effusion. 2.  No acute displaced fracture or dislocation. Electronically Signed   By: Iven Finn M.D.   On: 12/02/2021 15:09   CT Head Wo Contrast  Result Date: 12/02/2021 CLINICAL DATA:  Altered mental status, neck pain EXAM: CT HEAD WITHOUT CONTRAST TECHNIQUE:  Contiguous axial images were obtained from the base of the skull through the vertex without intravenous contrast. COMPARISON:  Brain MRI 11/28/2021 FINDINGS: Brain: There is no evidence of acute intracranial hemorrhage, extra-axial fluid collection,  or acute infarct. Parenchymal volume is normal. The ventricles are normal in size. There is no mass lesion. There is no midline shift. Vascular: No hyperdense vessel or unexpected calcification. Skull: Normal. Negative for fracture or focal lesion. Sinuses/Orbits: The paranasal sinuses are clear. Bilateral lens implants are in place. The globes and orbits are otherwise unremarkable. Other: None. IMPRESSION: No acute intracranial pathology. Electronically Signed   By: Valetta Mole M.D.   On: 12/02/2021 15:17   CT Cervical Spine Wo Contrast  Result Date: 12/02/2021 CLINICAL DATA:  Neck pain, history of breast cancer with metastatic disease EXAM: CT CERVICAL SPINE WITHOUT CONTRAST TECHNIQUE: Multidetector CT imaging of the cervical spine was performed without intravenous contrast. Multiplanar CT image reconstructions were also generated. COMPARISON:  None. FINDINGS: Alignment: There is trace retrolisthesis of C3 on C4 and grade 1 anterolisthesis of C4 on C5. There is no jumped or perched facets or other evidence of traumatic malalignment. Skull base and vertebrae: Skull base alignment is maintained. Vertebral body heights are preserved. There is partial fusion of the C2 and C3 vertebral bodies. There is no evidence of acute fracture. There is extensive sclerosis throughout the cervical spine below the C3 level consistent with diffuse osseous metastatic disease. Soft tissues and spinal canal: No prevertebral fluid or swelling. No visible canal hematoma. Disc levels: There is mild multilevel degenerative endplate change and facet arthropathy throughout the cervical spine, most advanced at C4-C5 through C6-C7. The osseous spinal canal is patent. There is moderate to  severe bilateral neural foraminal stenosis at C4-C5 through C6-C7. Upper chest: The imaged lung apices are clear. Other: Left chest port tubing is partially imaged. IMPRESSION: 1. No acute fracture or traumatic malalignment of the cervical spine. 2. Extensive sclerosis throughout the cervical spine below the C3 level consistent with extensive osseous metastatic disease. 3. Grade 1 anterolisthesis of C4 on C5 retrolisthesis of C3 on C4 and grade 1 anterolisthesis of C4 on C5, likely degenerative in nature. 4. Multilevel degenerative changes throughout the cervical spine as above. Electronically Signed   By: Valetta Mole M.D.   On: 12/02/2021 15:21    EKG: I independently viewed the EKG done and my findings are as followed: EKG was not done in the ED  Assessment/Plan Present on Admission:  Infection of right knee ([place])  Cellulitis of right lower extremity  Metastatic breast cancer ([place])  Principal Problem:   Infection of right knee ([place]) Active Problems:   Metastatic breast cancer ([place])   Cellulitis of right lower extremity   Lactic acidosis   Essential hypertension   SIRS (systemic inflammatory response syndrome) ([place])  Sepsis due to right knee infection Patient met sepsis criteria due to tachypnea, tachycardia And right knee joint infection Arthrocentesis was indicative of infectious joint knee Continue IV vancomycin Continue IV hydration Continue Tylenol as needed for fever and mild pain Continue oxycodone for moderate/severe pain Blood culture and body fluid culture pending  Right lower extremity cellulitis Continue IV vancomycin  Chronic left lower extremity venous stasis ulcer Continue wound care  Lactic acidosis-resolved  Metastatic breast cancer Patient with metastasis to the cervical spine Continue oxycodone and fentanyl patch Patient follows with Dr. Randa Evens  CKD stage 3B BUN/creatinine 18/1.33 (creatinine is within baseline range). Renally adjust medications,  avoid nephrotoxic agents/dehydration/hypotension  Essential hypertension Continue lisinopril  History of DVT Continue Xarelto  DVT prophylaxis: Xarelto  Code Status: Full code  Family Communication: None at bedside  Disposition Plan:  Patient is from:  home Anticipated DC to:                   SNF or family members home Anticipated DC date:               2-3 days Anticipated DC barriers:          Patient requires inpatient management due to sepsis secondary to right knee joint infection  Consults called: None  Admission status: Inpatient    Bernadette Hoit MD [facility]  12/02/2021, 8:02 PM

## 2021-12-02 NOTE — ED Triage Notes (Addendum)
Pt comes into the ED via [facility] from [facility] c/o right knee pain that she has been having for 3 days.  Pt also c/o neck pain.  H/o cancer in her neck and stage 4 breast cancer.  Pt given 2 oxycodone and a fentanyl patch that was placed this morning.  Pt denies any relief. Pt does present with swelling to the knee as well as low grade temp.  Pt also presents with wrap on the left foot.  Pt has documented mets to the bone.   169/88 112 HR 96% RA

## 2021-12-02 NOTE — ED Notes (Addendum)
CMS intact in left foot. Capillary refill <3 seconds. Left leg elevate on pillows

## 2021-12-02 NOTE — Consult Note (Signed)
Pharmacy Antibiotic Note  Laura Santiago is a 75 y.o. female admitted on 12/02/2021 with sepsis secondary to a right knee infection.  Pharmacy has been consulted for vancomycin dosing.  Plan: Vancomycin 1500 mg IV loading dose, followed by 750 mg IV q24h  Goal AUC 400-550  Est AUC: 542.8 Est Cmax: 30.9 Est Cmin: 16.4 Calculated with SCr 1.33  Monitor clinical picture, renal function, and vancomycin levels at steady state F/U C&S, abx deescalation / LOT   Height: 5\' 2"  (157.5 cm) Weight: 69.4 kg (153 lb) IBW/kg (Calculated) : 50.1  Temp (24hrs), Avg:100.3 F (37.9 C), Min:100.3 F (37.9 C), Max:100.3 F (37.9 C)  Recent Labs  Lab 12/02/21 1018 12/02/21 1023 12/02/21 1520  WBC 10.7*  --   --   CREATININE 1.33*  --   --   LATICACIDVEN  --  2.1* 0.9    Estimated Creatinine Clearance: 32.3 mL/min (A) (by C-G formula based on SCr of 1.33 mg/dL (H)).    Allergies  Allergen Reactions   Corticosteroids Other (See Comments)    Pt trf from [name] and per primary md for her cancer tx. Notes that it causes agitation intolerance   Sulfa Antibiotics Other (See Comments)    Pt moved from [name] and in MD notes she has allergy but we do not know reactions when taking the drug   Celebrex [Celecoxib] Rash    Antimicrobials this admission: 12/23 vancomycin >>  12/23 cefepime/flagyl x 1   Dose adjustments this admission: N/A  Microbiology results: 12/23 BCx: pending 12/23 Synovial fluid cx: pending   Thank you for allowing pharmacy to be a part of this patients care.  Darnelle Bos, PharmD 12/02/2021 8:10 PM

## 2021-12-02 NOTE — Progress Notes (Signed)
PHARMACY -  BRIEF ANTIBIOTIC NOTE   Pharmacy has received consult(s) for vancomycin and cefepime from an ED provider.  The patient's profile has been reviewed for ht/wt/allergies/indication/available labs.    One time order(s) placed for: Cefepime 2 g IV Vancomycin 1500 mg IV  Further antibiotics/pharmacy consults should be ordered by admitting physician if indicated.                       Thank you, Forde Dandy Cristina Ceniceros 12/02/2021  2:40 PM

## 2021-12-02 NOTE — ED Notes (Signed)
Pt back from imaging, MD [place] informed

## 2021-12-02 NOTE — Progress Notes (Signed)
CODE SEPSIS - PHARMACY COMMUNICATION  **Broad Spectrum Antibiotics should be administered within 1 hour of Sepsis diagnosis**  Time Code Sepsis Called/Page Received: 1435  Antibiotics Ordered: vancomycin, cefepime metronidazole  Time of 1st antibiotic administration: 1544  Comments: pt was away at [place] ,PharmD Clinical Pharmacist  12/02/2021  2:39 PM

## 2021-12-02 NOTE — Progress Notes (Signed)
[facility] following for sepsis protocol. 

## 2021-12-02 NOTE — Telephone Encounter (Signed)
Call returned to [place] at 9 AM and advised to call 911 to have patient transported to hospital ER. She agreed to this

## 2021-12-03 ENCOUNTER — Encounter: Payer: Self-pay | Admitting: Anesthesiology

## 2021-12-03 ENCOUNTER — Encounter
Admission: EM | Disposition: A | Discharge status: Skilled Nursing Facility | Payer: Self-pay | Source: Skilled Nursing Facility | Attending: Internal Medicine

## 2021-12-03 ENCOUNTER — Inpatient Hospital Stay: Payer: Medicare Other

## 2021-12-03 LAB — CBC
HCT: 29.3 % — ABNORMAL LOW (ref 36.0–46.0)
Hemoglobin: 9.2 g/dL — ABNORMAL LOW (ref 12.0–15.0)
MCH: 28 pg (ref 26.0–34.0)
MCHC: 31.4 g/dL (ref 30.0–36.0)
MCV: 89.3 fL (ref 80.0–100.0)
Platelets: 239 10*3/uL (ref 150–400)
RBC: 3.28 MIL/uL — ABNORMAL LOW (ref 3.87–5.11)
RDW: 16.2 % — ABNORMAL HIGH (ref 11.5–15.5)
WBC: 8.5 10*3/uL (ref 4.0–10.5)
nRBC: 0 % (ref 0.0–0.2)

## 2021-12-03 LAB — COMPREHENSIVE METABOLIC PANEL
ALT: 10 U/L (ref 0–44)
AST: 15 U/L (ref 15–41)
Albumin: 2.8 g/dL — ABNORMAL LOW (ref 3.5–5.0)
Alkaline Phosphatase: 59 U/L (ref 38–126)
Anion gap: 7 (ref 5–15)
BUN: 19 mg/dL (ref 8–23)
CO2: 22 mmol/L (ref 22–32)
Calcium: 7.7 mg/dL — ABNORMAL LOW (ref 8.9–10.3)
Chloride: 106 mmol/L (ref 98–111)
Creatinine, Ser: 1.19 mg/dL — ABNORMAL HIGH (ref 0.44–1.00)
GFR, Estimated: 47 mL/min — ABNORMAL LOW (ref >60)
Glucose, Bld: 127 mg/dL — ABNORMAL HIGH (ref 70–99)
Potassium: 4 mmol/L (ref 3.5–5.1)
Sodium: 135 mmol/L (ref 135–145)
Total Bilirubin: 0.9 mg/dL (ref 0.3–1.2)
Total Protein: 6.2 g/dL — ABNORMAL LOW (ref 6.5–8.1)

## 2021-12-03 LAB — APTT: aPTT: 32 seconds (ref 24–36)

## 2021-12-03 LAB — PHOSPHORUS: Phosphorus: 2.7 mg/dL (ref 2.5–4.6)

## 2021-12-03 LAB — MAGNESIUM: Magnesium: 1.7 mg/dL (ref 1.7–2.4)

## 2021-12-03 SURGERY — IRRIGATION AND DEBRIDEMENT KNEE
Anesthesia: Choice | Site: Knee | Laterality: Right

## 2021-12-03 MED ORDER — LISINOPRIL 20 MG PO TABS
20.0000 mg | ORAL_TABLET | Freq: Every day | ORAL | Status: DC
  Administered 2021-12-04 – 2021-12-08 (×5): 20 mg via ORAL
  Filled 2021-12-03 (×3): qty 1
  Filled 2021-12-03: qty 2
  Filled 2021-12-03 (×2): qty 1

## 2021-12-03 MED ORDER — SODIUM CHLORIDE 0.9 % IV SOLN: INTRAVENOUS | Status: DC

## 2021-12-03 MED ORDER — RIVAROXABAN 20 MG PO TABS: 20.0000 mg | ORAL_TABLET | Freq: Every day | ORAL | Status: DC

## 2021-12-03 MED ORDER — SODIUM CHLORIDE 0.9 % IV SOLN
2.0000 g | Freq: Two times a day (BID) | INTRAVENOUS | Status: DC
  Administered 2021-12-03 – 2021-12-06 (×7): 2 g via INTRAVENOUS
  Filled 2021-12-03 (×8): qty 2

## 2021-12-03 MED ORDER — OXYCODONE HCL 5 MG PO TABS
10.0000 mg | ORAL_TABLET | ORAL | Status: DC | PRN
  Administered 2021-12-03 – 2021-12-04 (×3): 10 mg via ORAL
  Filled 2021-12-03 (×4): qty 2

## 2021-12-03 MED ORDER — FENTANYL 12 MCG/HR TD PT72: 1.0000 | MEDICATED_PATCH | TRANSDERMAL | Status: DC

## 2021-12-03 MED ORDER — OXYCODONE HCL 5 MG PO TABS
10.0000 mg | ORAL_TABLET | Freq: Four times a day (QID) | ORAL | Status: DC | PRN
  Administered 2021-12-03 (×2): 10 mg via ORAL
  Filled 2021-12-03 (×2): qty 2

## 2021-12-03 SURGICAL SUPPLY — 38 items
BAG COUNTER SPONGE SURGICOUNT (BAG) IMPLANT
BAG SURGICOUNT SPONGE COUNTING (BAG)
BLADE FULL RADIUS 3.5 (BLADE) ×3 IMPLANT
BLADE SHAVER 4.5X7 STR FR (MISCELLANEOUS) ×3 IMPLANT
BNDG ELASTIC 6X5.8 VLCR STR LF (GAUZE/BANDAGES/DRESSINGS) ×3 IMPLANT
BNDG ESMARK 6X12 TAN STRL LF (GAUZE/BANDAGES/DRESSINGS) ×3 IMPLANT
CHLORAPREP W/TINT 26 (MISCELLANEOUS) ×3 IMPLANT
CUFF TOURN SGL QUICK 24 (TOURNIQUET CUFF)
CUFF TOURN SGL QUICK 34 (TOURNIQUET CUFF)
CUFF TRNQT CYL 24X4X16.5-23 (TOURNIQUET CUFF) IMPLANT
CUFF TRNQT CYL 34X4.125X (TOURNIQUET CUFF) IMPLANT
DRAPE ARTHRO LIMB 89X125 STRL (DRAPES) ×3 IMPLANT
DRAPE IMP U-DRAPE 54X76 (DRAPES) ×3 IMPLANT
ELECT REM PT RETURN 9FT ADLT (ELECTROSURGICAL) ×3
ELECTRODE REM PT RTRN 9FT ADLT (ELECTROSURGICAL) ×1 IMPLANT
GAUZE SPONGE 4X4 12PLY STRL (GAUZE/BANDAGES/DRESSINGS) ×3 IMPLANT
GLOVE SURG ENC MOIS LTX SZ8 (GLOVE) ×6 IMPLANT
GLOVE SURG ENC TEXT LTX SZ7 (GLOVE) ×6 IMPLANT
GLOVE SURG UNDER LTX SZ8 (GLOVE) ×3 IMPLANT
GLOVE SURG UNDER POLY LF SZ7.5 (GLOVE) ×3 IMPLANT
GOWN STRL REUS W/ TWL LRG LVL3 (GOWN DISPOSABLE) ×1 IMPLANT
GOWN STRL REUS W/ TWL XL LVL3 (GOWN DISPOSABLE) ×2 IMPLANT
GOWN STRL REUS W/TWL LRG LVL3 (GOWN DISPOSABLE) ×2
GOWN STRL REUS W/TWL XL LVL3 (GOWN DISPOSABLE) ×4
IV LACTATED RINGER IRRG 3000ML (IV SOLUTION) ×2
IV LR IRRIG 3000ML ARTHROMATIC (IV SOLUTION) ×1 IMPLANT
KIT TURNOVER KIT A (KITS) ×3 IMPLANT
MANIFOLD NEPTUNE II (INSTRUMENTS) ×6 IMPLANT
NEEDLE HYPO 21X1.5 SAFETY (NEEDLE) ×3 IMPLANT
PACK ARTHROSCOPY KNEE (MISCELLANEOUS) ×3 IMPLANT
PENCIL ELECTRO HAND CTR (MISCELLANEOUS) ×3 IMPLANT
SPONGE T-LAP 18X18 ~~LOC~~+RFID (SPONGE) ×3 IMPLANT
SUT PROLENE 4 0 PS 2 18 (SUTURE) ×3 IMPLANT
SUT TICRON COATED BLUE 2 0 30 (SUTURE) IMPLANT
SYR 50ML LL SCALE MARK (SYRINGE) ×3 IMPLANT
TUBING INFLOW SET DBFLO PUMP (TUBING) ×3 IMPLANT
WAND WEREWOLF FLOW 90D (MISCELLANEOUS) ×3 IMPLANT
WATER STERILE IRR 500ML POUR (IV SOLUTION) ×3 IMPLANT

## 2021-12-03 NOTE — Progress Notes (Signed)
PROGRESS NOTE    Laura Santiago  MVH:[id number] DOB: 14-Dec-1945 DOA: 12/02/2021 PCP: Orvis Brill, [facility]    Chief Complaint  Patient presents with   Knee Pain   Neck Pain    Brief Narrative:   Laura Santiago is a 75 y.o. female with medical history significant for DVT on Xarelto, stage IIIb CKD, chronic venous stasis, essential hypertension, metastatic breast cancer (ER weakly +10%, PR negative and HER2 positive) and chronic leg wound who presents to the emergency department due to right knee pain. In the emergency department, she was tachypneic, but hemodynamically stable.  Work-up in the ED showed mild leukocytosis, normocytic anemia, hyperglycemia, BUN/creatinine 18/1.33 (creatinine is within baseline range).  Lactic acid 2.1 > 0.9, synovial fluid had a turbid appearance, WBC was 60,425.  Influenza A, B, SARS coronavirus 2 was negative.  CT cervical spine without contrast showed no acute fracture or traumatic malalignment of the cervical spine. CT head without contrast showed no acute intracranial pathology Chest x-ray showed no active cardiopulmonary disease Right knee showed Tricompartmental severe degenerative changes of the right knee with associated joint effusion. Patient was started on IV cefepime, Flagyl and vancomycin.  Orthopedics consulted for possible septic arthritis.    Assessment & Plan:   Principal Problem:   Infection of right knee (HCC) Active Problems:   Metastatic breast cancer ([place])   Cellulitis of right lower extremity   Lactic acidosis   Essential hypertension   SIRS (systemic inflammatory response syndrome) (HCC)   Sepsis probably secondary to a combination of mild right leg cellulitis vs from right septic arthritis.  Arthrocentesis revealed yellow turbid liquid with 68,425 WBCs, INTRACELLULAR CALCIUM PYROPHOSPHATE CRYSTALS .  Fluid sent for cultures. MRI of the right knee shows Advanced tricompartmental osteoarthritis, most severe within  the lateral compartment. Complex tearing/maceration of the visualized portions of the lateral meniscus. Vertical tear involving the peripheral aspect of the medial meniscal posterior horn. Large joint effusion with a few intra-articular loose bodies. Internal heterogeneity within the joint suggests synovitis. Findings may be reactive secondary to underlying arthropathy. Septic arthritis is not entirely excluded.  Orthopedics consulted for further evaluation.  Pain control and started on broad spectrum IV antibiotics.  Follow cultures and cultures of the synovial fluid.      Metastatic breast cancer On chemotherapy. Continue with oxycodone and fentanyl patch. Follows up with Dr. Janese Banks as outpatient    Stage IIIb CKD Creatinine at baseline   Essential hypertension Blood pressure parameters are optimal.    Mild right lower extremity cellulitis Continue with IV antibiotics.    History of chronic left lower extremity venous stasis ulcer Wound care consulted for recommendations.   History of DVT On Xarelto which has been on hold for 2 weeks for now for unclear reasons   DVT prophylaxis: xarelto.  Code Status: full code.  Family Communication:family at bedside.  Disposition:   Status is: Inpatient  Remains inpatient appropriate because: IV antibiotics, further work up by EDP.        Consultants:  Orthopedics.   Procedures: arthrocentesis by EDP.   Antimicrobials:  Antibiotics Given (last 72 hours)     Date/Time Action Medication Dose Rate   12/02/21 1544 New Bag/Given   ceFEPIme (MAXIPIME) 2 g in sodium chloride 0.9 % 100 mL IVPB 2 g 200 mL/hr   12/02/21 1611 New Bag/Given   vancomycin (VANCOREADY) IVPB 1500 mg/300 mL 1,500 mg 150 mL/hr   12/02/21 1612 New Bag/Given   metroNIDAZOLE (FLAGYL) IVPB 500 mg 500  mg 100 mL/hr   12/03/21 1004 New Bag/Given   ceFEPIme (MAXIPIME) 2 g in sodium chloride 0.9 % 100 mL IVPB 2 g 200 mL/hr          Subjective: Right knee pain.   Objective: Vitals:   12/03/21 0630 12/03/21 0930 12/03/21 1004 12/03/21 1115  BP: 135/63 133/67 116/70 (!) 144/72  Pulse: 80 79  80  Resp: $Remo'15 13  16  'DpFMb$ Temp:    (!) 97.5 F (36.4 C)  TempSrc:    Oral  SpO2: 96% 100%  100%  Weight:      Height:        Intake/Output Summary (Last 24 hours) at 12/03/2021 1316 Last data filed at 12/03/2021 0716 Gross per 24 hour  Intake --  Output 800 ml  Net -800 ml   Filed Weights   12/02/21 1011  Weight: 69.4 kg    Examination:  General exam: Appears calm and comfortable  Respiratory system: Clear to auscultation. Respiratory effort normal. Cardiovascular system: S1 & S2 heard, RRR. No JVD, murmurs Gastrointestinal system: Abdomen is nondistended, soft and nontender. Normal bowel sounds heard. Central nervous system: Alert and oriented. No focal neurological deficits. Extremities: right knee swelling and tenderness.  Skin: chronic left leg wound bandaged.  Psychiatry: Mood & affect appropriate.     Data Reviewed: I have personally reviewed following labs and imaging studies  CBC: Recent Labs  Lab 12/02/21 1018 12/03/21 0717  WBC 10.7* 8.5  NEUTROABS 9.5*  --   HGB 10.3* 9.2*  HCT 33.7* 29.3*  MCV 90.6 89.3  PLT 272 010    Basic Metabolic Panel: Recent Labs  Lab 12/02/21 1018 12/03/21 0717  NA 136 135  K 4.0 4.0  CL 104 106  CO2 24 22  GLUCOSE 125* 127*  BUN 18 19  CREATININE 1.33* 1.19*  CALCIUM 8.2* 7.7*  MG  --  1.7  PHOS  --  2.7    GFR: Estimated Creatinine Clearance: 36.1 mL/min (A) (by C-G formula based on SCr of 1.19 mg/dL (H)).  Liver Function Tests: Recent Labs  Lab 12/02/21 1018 12/03/21 0717  AST 22 15  ALT 11 10  ALKPHOS 71 59  BILITOT 0.9 0.9  PROT 7.5 6.2*  ALBUMIN 3.7 2.8*    CBG: No results for input(s): GLUCAP in the last 168 hours.   Recent Results (from the past 240 hour(s))  Resp Panel by RT-PCR (Flu A&B, Covid) Nasopharyngeal Swab      Status: None   Collection Time: 12/02/21  3:20 PM   Specimen: Nasopharyngeal Swab; Nasopharyngeal(NP) swabs in vial transport medium  Result Value Ref Range Status   SARS Coronavirus 2 by RT PCR NEGATIVE NEGATIVE Final    Comment: (NOTE) SARS-CoV-2 target nucleic acids are NOT DETECTED.  The SARS-CoV-2 RNA is generally detectable in upper respiratory specimens during the acute phase of infection. The lowest concentration of SARS-CoV-2 viral copies this assay can detect is 138 copies/mL. A negative result does not preclude SARS-Cov-2 infection and should not be used as the sole basis for treatment or other patient management decisions. A negative result may occur with  improper specimen collection/handling, submission of specimen other than nasopharyngeal swab, presence of viral mutation(s) within the areas targeted by this assay, and inadequate number of viral copies(<138 copies/mL). A negative result must be combined with clinical observations, patient history, and epidemiological information. The expected result is Negative.  Fact Sheet for Patients:  EntrepreneurPulse.com.au  Fact Sheet for Healthcare Providers:  IncredibleEmployment.be  This test is no t yet approved or cleared by the [place] and  has been authorized for detection and/or diagnosis of SARS-CoV-2 by FDA under an Emergency Use Authorization (EUA). This EUA will remain  in effect (meaning this test can be used) for the duration of the COVID-19 declaration under Section 564(b)(1) of the Act, 21 U.S.C.section 360bbb-3(b)(1), unless the authorization is terminated  or revoked sooner.       Influenza A by PCR NEGATIVE NEGATIVE Final   Influenza B by PCR NEGATIVE NEGATIVE Final    Comment: (NOTE) The Xpert Xpress SARS-CoV-2/FLU/RSV plus assay is intended as an aid in the diagnosis of influenza from Nasopharyngeal swab specimens and should not be used as a sole basis  for treatment. Nasal washings and aspirates are unacceptable for Xpert Xpress SARS-CoV-2/FLU/RSV testing.  Fact Sheet for Patients: EntrepreneurPulse.com.au  Fact Sheet for Healthcare Providers: IncredibleEmployment.be  This test is not yet approved or cleared by the [place] FDA and has been authorized for detection and/or diagnosis of SARS-CoV-2 by FDA under an Emergency Use Authorization (EUA). This EUA will remain in effect (meaning this test can be used) for the duration of the COVID-19 declaration under Section 564(b)(1) of the Act, 21 U.S.C. section 360bbb-3(b)(1), unless the authorization is terminated or revoked.  Performed at [facility], [address] [place], [place] [address]   Culture, blood (routine x 2)     Status: None (Preliminary result)   Collection Time: 12/02/21  3:20 PM   Specimen: BLOOD  Result Value Ref Range Status   Specimen Description BLOOD PORT  Final   Special Requests   Final    BOTTLES DRAWN AEROBIC AND ANAEROBIC Blood Culture results may not be optimal due to an excessive volume of blood received in culture bottles   Culture   Final    NO GROWTH < 24 HOURS Performed at [facility], [address], [place], [place] [address]    Report Status PENDING  Incomplete  Culture, blood (routine x 2)     Status: None (Preliminary result)   Collection Time: 12/02/21  3:20 PM   Specimen: BLOOD  Result Value Ref Range Status   Specimen Description BLOOD LEFT ARM  Final   Special Requests   Final    BOTTLES DRAWN AEROBIC AND ANAEROBIC Blood Culture adequate volume   Culture   Final    NO GROWTH < 24 HOURS Performed at [facility], [address] [place], [place] [address]    Report Status PENDING  Incomplete  Body fluid culture w Gram Stain     Status: None (Preliminary result)   Collection Time: 12/02/21  3:20 PM   Specimen: KNEE; Body Fluid  Result Value Ref Range Status    Specimen Description   Final    KNEE RIGHT Performed at [facility], [address], [place], [place] [address]    Special Requests   Final    NONE Performed at [facility], [address], [place], [place] [address]    Gram Stain   Final    ABUNDANT WBC PRESENT, PREDOMINANTLY PMN NO ORGANISMS SEEN    Culture   Final    NO GROWTH < 12 HOURS Performed at [facility], [place] [address] [place], [place] [address]    Report Status PENDING  Incomplete         Radiology Studies: DG Chest 2 View  Result Date: 12/02/2021 CLINICAL DATA:  Sepsis EXAM: CHEST -  2 VIEW COMPARISON:  07/01/2021 FINDINGS: Left-sided Port-A-Cath in stable positioning. The heart size and mediastinal contours are within normal limits. Chronic elevation of the right hemidiaphragm. No focal airspace consolidation, pleural effusion, or pneumothorax. No appreciable interval change in appearance of sclerotic osseous metastatic disease. IMPRESSION: No active cardiopulmonary disease. Electronically Signed   By: Davina Poke D.O.   On: 12/02/2021 15:12   DG Knee 2 Views Right  Result Date: 12/02/2021 CLINICAL DATA:  Pain and swelling EXAM: RIGHT KNEE - 1-2 VIEW COMPARISON:  None. FINDINGS: No evidence of fracture or dislocation. At least moderate volume joint effusion. Severe tricompartmental degenerative changes. No aggressive appearing focal bone abnormality. Soft tissues are unremarkable. IMPRESSION: 1. Tricompartmental severe degenerative changes of the right knee with associated joint effusion. 2.  No acute displaced fracture or dislocation. Electronically Signed   By: Iven Finn M.D.   On: 12/02/2021 15:09   CT Head Wo Contrast  Result Date: 12/02/2021 CLINICAL DATA:  Altered mental status, neck pain EXAM: CT HEAD WITHOUT CONTRAST TECHNIQUE: Contiguous axial images were obtained from the base of the skull through the vertex without intravenous contrast. COMPARISON:  Brain MRI  11/28/2021 FINDINGS: Brain: There is no evidence of acute intracranial hemorrhage, extra-axial fluid collection, or acute infarct. Parenchymal volume is normal. The ventricles are normal in size. There is no mass lesion. There is no midline shift. Vascular: No hyperdense vessel or unexpected calcification. Skull: Normal. Negative for fracture or focal lesion. Sinuses/Orbits: The paranasal sinuses are clear. Bilateral lens implants are in place. The globes and orbits are otherwise unremarkable. Other: None. IMPRESSION: No acute intracranial pathology. Electronically Signed   By: Valetta Mole M.D.   On: 12/02/2021 15:17   CT Cervical Spine Wo Contrast  Result Date: 12/02/2021 CLINICAL DATA:  Neck pain, history of breast cancer with metastatic disease EXAM: CT CERVICAL SPINE WITHOUT CONTRAST TECHNIQUE: Multidetector CT imaging of the cervical spine was performed without intravenous contrast. Multiplanar CT image reconstructions were also generated. COMPARISON:  None. FINDINGS: Alignment: There is trace retrolisthesis of C3 on C4 and grade 1 anterolisthesis of C4 on C5. There is no jumped or perched facets or other evidence of traumatic malalignment. Skull base and vertebrae: Skull base alignment is maintained. Vertebral body heights are preserved. There is partial fusion of the C2 and C3 vertebral bodies. There is no evidence of acute fracture. There is extensive sclerosis throughout the cervical spine below the C3 level consistent with diffuse osseous metastatic disease. Soft tissues and spinal canal: No prevertebral fluid or swelling. No visible canal hematoma. Disc levels: There is mild multilevel degenerative endplate change and facet arthropathy throughout the cervical spine, most advanced at C4-C5 through C6-C7. The osseous spinal canal is patent. There is moderate to severe bilateral neural foraminal stenosis at C4-C5 through C6-C7. Upper chest: The imaged lung apices are clear. Other: Left chest port  tubing is partially imaged. IMPRESSION: 1. No acute fracture or traumatic malalignment of the cervical spine. 2. Extensive sclerosis throughout the cervical spine below the C3 level consistent with extensive osseous metastatic disease. 3. Grade 1 anterolisthesis of C4 on C5 retrolisthesis of C3 on C4 and grade 1 anterolisthesis of C4 on C5, likely degenerative in nature. 4. Multilevel degenerative changes throughout the cervical spine as above. Electronically Signed   By: Valetta Mole M.D.   On: 12/02/2021 15:21        Scheduled Meds:  fentaNYL  1 patch Transdermal Q72H   lisinopril  20 mg Oral Daily  Continuous Infusions:  sodium chloride 75 mL/hr at 12/03/21 1008   ceFEPime (MAXIPIME) IV 2 g (12/03/21 1004)   vancomycin       LOS: 1 day        Hosie Poisson, MD [facility]   To contact the attending provider between 7A-7P or the covering provider during after hours 7P-7A, please log into the web site [email or web address] and access using universal [place] password for that web site. If you do not have the password, please call the hospital operator.  12/03/2021, 1:16 PM

## 2021-12-03 NOTE — ED Notes (Signed)
Informed RN bed assigned 

## 2021-12-03 NOTE — Anesthesia Preprocedure Evaluation (Deleted)
Anesthesia Evaluation  Patient identified by MRN, date of birth, ID band Patient awake    Reviewed: Allergy & Precautions, NPO status , Patient's Chart, lab work & pertinent test results  Airway        Dental   Pulmonary neg pulmonary ROS,           Cardiovascular hypertension,   H/O DVT treated with chronic anticoaguation   Neuro/Psych PSYCHIATRIC DISORDERS  Neuromuscular disease (Muscle Atrophy)    GI/Hepatic Neg liver ROS, GERD  ,  Endo/Other  negative endocrine ROS  Renal/GU CRFRenal disease  negative genitourinary   Musculoskeletal  (+) Arthritis ,   Abdominal   Peds negative pediatric ROS (+)  Hematology  (+) anemia ,   Anesthesia Other Findings INFECTED KNEE  Reproductive/Obstetrics negative OB ROS                             Anesthesia Physical Anesthesia Plan Anesthesia Quick Evaluation

## 2021-12-03 NOTE — Consult Note (Addendum)
ORTHOPAEDIC CONSULTATION  REQUESTING PHYSICIAN: Bernadette Hoit, DO  Chief Complaint:   Right knee pain and swelling  History of Present Illness: Laura Santiago is a 75 y.o. female with multiple medical problems including ADHD, anemia, history of breast cancer, history of bilateral DVTs, gastroesophageal reflux disease, hyperparathyroidism, hypertension, osteopenia, and a nonhealing ulceration of the left lower leg being treated with dressing changes who presented to the emergency room yesterday complaining of increased right knee pain and confusion..  The patient was noted to have a slightly elevated lactic acid of 2.1 and was mildly febrile at 100.3, so the patient was admitted for further work-up.  An aspiration of the right knee by the ER provider showed greater than 60,000 white cells but no organisms were seen on the gram stain.  It also demonstrated the presence of calcium pyrophosphate crystals, consistent with pseudogout.  The patient was admitted for treatment of her presumed sepsis.  I have been asked to evaluate the patient for her right knee pain and swelling.  The patient notes that she has a history of degenerative joint disease of the right knee and recalls undergoing an open lateral meniscectomy for a torn lateral meniscus when she injured her knee skiing while a teenager.  Past Medical History:  Diagnosis Date   ADHD (attention deficit hyperactivity disorder)    in [place], no date on md note   Anemia    IDA 11/26/2019, Anemia in stage 4 chronic kidney disease ([place]) 09/03/2020   Arthritis    osteoarthritis right knee 09/30/2014   Breast cancer ([place]) 10/05/2013   in [place], [place] -, Her 2 is 3+   Cancer related pain 03/28/2021   in [name], md notes spine mets   DVT of lower extremity, bilateral ([place]) 03/30/2014   in [name]   Generalized muscle weakness 03/31/2016   in [name]   GERD (gastroesophageal reflux disease)  05/15/2013   per md in [name]   Hyperparathyroidism, secondary ([place]) 04/25/2018   in [name]   Hypertension 02/20/2016   info from MD in [facility]   Lumbar compression fracture ([place]) 03/18/2021   in [place] loss 02/05/2015   in [name]   Metabolic acidosis 95/28/4132   in [place]   Metabolic syndrome 44/12/270   in [name]   Osteopenia after menopause 03/29/2016   in [place] of both eyes 09/30/2012   per md in [name] where pt. 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   in [name]   Stasis ulcer of left lower extremity ([place]) 03/29/2016   in [name]   Past Surgical History:  Procedure Laterality Date   CESAREAN SECTION     unknown   fibroid removed  N/A    in [place] - [place] date   IR FLUORO GUIDE CV LINE LEFT  07/27/2021   IR PORT REPAIR CENTRAL VENOUS ACCESS DEVICE Left    In [place] N/A 02/25/2014   in [name]   ovary removed      unknown   [place] CATARACT EXTRACAP,INSERT LENS Bilateral  Bilateral 09/05/2012   in [place]     unknown    LUMPECTOMY Right 04/03/2014   in [place]   Social History   Socioeconomic History   Marital status: Divorced    Spouse name: Not on file   Number of children: Not on file   Years of education: Not on file   Highest education level: Not on file  Occupational History   Occupation: retired Teacher, music    Comment: In [place]  Tobacco Use   Smoking status: Never   Smokeless tobacco: Never  Vaping Use   Vaping Use: Never used  Substance and Sexual Activity   Alcohol use: Not Currently   Drug use: Never   Sexual activity: Not Currently  Other Topics Concern   Not on file  Social History Narrative   Not on file   Social Determinants of Health   Financial Resource Strain: Not on file  Food Insecurity: Not on file  Transportation Needs: Not on file  Physical Activity: Not on file  Stress: Not on file  Social Connections: Not on file   Family History  Problem Relation Age of  Onset   Pancreatic cancer Mother    Stroke Father    Diabetes Father    Hypertension Father    Heart disease Father    Skin cancer Father    Varicose Veins Father    Skin cancer Brother    Cancer - Prostate Brother    Allergies  Allergen Reactions   Corticosteroids Other (See Comments)    Pt trf from [name] and per primary md for her cancer tx. Notes that it causes agitation intolerance   Sulfa Antibiotics Other (See Comments)    Pt moved from [name] and in MD notes she has allergy but we do not know reactions when taking the drug   Celebrex [Celecoxib] Rash   Prior to Admission medications   Medication Sig Start Date End Date Taking? Authorizing Provider  acetaminophen (TYLENOL) 500 MG tablet Take 500-1,000 mg by mouth every 6 (six) hours as needed for mild pain or moderate pain.   Yes [provider]  amphetamine-dextroamphetamine (ADDERALL XR) 30 MG 24 hr capsule Take 1 capsule (30 mg total) by mouth daily. 11/15/21  Yes [name], MD  Calcium 200 MG TABS Take 1 tablet by mouth daily.   Yes [provider]  fentaNYL (DURAGESIC) 12 MCG/HR Place 1 patch onto the skin every 3 (three) days. 1 patch to skin 11/15/21  Yes [name], MD  lisinopril (ZESTRIL) 20 MG tablet Take 20 mg by mouth daily.   Yes [provider]  LORazepam (ATIVAN) 0.5 MG tablet Take 1 tablet (0.5 mg total) by mouth daily as needed for anxiety. 10/21/21  Yes [name], MD  Multiple Vitamins-Minerals (MULTIVITAMIN ADULTS) TABS Take 1 tablet by mouth daily.   Yes [provider]  Oxycodone HCl 10 MG TABS TAKE ONE TABLET BY MOUTH EVERY 4 HOURS AS NEEDED 11/25/21  Yes Verlon Au, NP  sodium hypochlorite (DAKIN'S 1/4 STRENGTH) 0.125 % SOLN Apply 1 application topically as directed. Moisten gauze with solution and wrap wound   Yes [provider]  terbinafine (LAMISIL AT) 1 % cream Apply 1 application topically 2 (two) times daily. Patient taking differently:  Apply 1 application topically 2 (two) times daily as needed (fungal treatment). 08/29/21  Yes Rosiland Oz, MD  potassium chloride SA (KLOR-CON) 20 MEQ tablet Take 1 tablet (20 mEq total) by mouth daily. Patient not taking: Reported on 11/29/2021 09/09/21   [name], MD  rivaroxaban (XARELTO) 20 MG TABS tablet Take 1 tablet (20 mg total) by mouth daily with supper. Patient not taking: Reported on 12/01/2021 11/29/21   Kris Hartmann, NP  zolpidem (AMBIEN) 5 MG tablet Take 1 tablet (5 mg total) by mouth at bedtime. Patient not taking: Reported on 12/02/2021  09/30/21   [name], MD   DG Chest 2 View  Result Date: 12/02/2021 CLINICAL DATA:  Sepsis EXAM: CHEST - 2 VIEW COMPARISON:  07/01/2021 FINDINGS: Left-sided Port-A-Cath in stable positioning. The heart size and mediastinal contours are within normal limits. Chronic elevation of the right hemidiaphragm. No focal airspace consolidation, pleural effusion, or pneumothorax. No appreciable interval change in appearance of sclerotic osseous metastatic disease. IMPRESSION: No active cardiopulmonary disease. Electronically Signed   By: Davina Poke D.O.   On: 12/02/2021 15:12   DG Knee 2 Views Right  Result Date: 12/02/2021 CLINICAL DATA:  Pain and swelling EXAM: RIGHT KNEE - 1-2 VIEW COMPARISON:  None. FINDINGS: No evidence of fracture or dislocation. At least moderate volume joint effusion. Severe tricompartmental degenerative changes. No aggressive appearing focal bone abnormality. Soft tissues are unremarkable. IMPRESSION: 1. Tricompartmental severe degenerative changes of the right knee with associated joint effusion. 2.  No acute displaced fracture or dislocation. Electronically Signed   By: Iven Finn M.D.   On: 12/02/2021 15:09   CT Head Wo Contrast  Result Date: 12/02/2021 CLINICAL DATA:  Altered mental status, neck pain EXAM: CT HEAD WITHOUT CONTRAST TECHNIQUE: Contiguous axial images were obtained from the base of the  skull through the vertex without intravenous contrast. COMPARISON:  Brain MRI 11/28/2021 FINDINGS: Brain: There is no evidence of acute intracranial hemorrhage, extra-axial fluid collection, or acute infarct. Parenchymal volume is normal. The ventricles are normal in size. There is no mass lesion. There is no midline shift. Vascular: No hyperdense vessel or unexpected calcification. Skull: Normal. Negative for fracture or focal lesion. Sinuses/Orbits: The paranasal sinuses are clear. Bilateral lens implants are in place. The globes and orbits are otherwise unremarkable. Other: None. IMPRESSION: No acute intracranial pathology. Electronically Signed   By: Valetta Mole M.D.   On: 12/02/2021 15:17   CT Cervical Spine Wo Contrast  Result Date: 12/02/2021 CLINICAL DATA:  Neck pain, history of breast cancer with metastatic disease EXAM: CT CERVICAL SPINE WITHOUT CONTRAST TECHNIQUE: Multidetector CT imaging of the cervical spine was performed without intravenous contrast. Multiplanar CT image reconstructions were also generated. COMPARISON:  None. FINDINGS: Alignment: There is trace retrolisthesis of C3 on C4 and grade 1 anterolisthesis of C4 on C5. There is no jumped or perched facets or other evidence of traumatic malalignment. Skull base and vertebrae: Skull base alignment is maintained. Vertebral body heights are preserved. There is partial fusion of the C2 and C3 vertebral bodies. There is no evidence of acute fracture. There is extensive sclerosis throughout the cervical spine below the C3 level consistent with diffuse osseous metastatic disease. Soft tissues and spinal canal: No prevertebral fluid or swelling. No visible canal hematoma. Disc levels: There is mild multilevel degenerative endplate change and facet arthropathy throughout the cervical spine, most advanced at C4-C5 through C6-C7. The osseous spinal canal is patent. There is moderate to severe bilateral neural foraminal stenosis at C4-C5 through  C6-C7. Upper chest: The imaged lung apices are clear. Other: Left chest port tubing is partially imaged. IMPRESSION: 1. No acute fracture or traumatic malalignment of the cervical spine. 2. Extensive sclerosis throughout the cervical spine below the C3 level consistent with extensive osseous metastatic disease. 3. Grade 1 anterolisthesis of C4 on C5 retrolisthesis of C3 on C4 and grade 1 anterolisthesis of C4 on C5, likely degenerative in nature. 4. Multilevel degenerative changes throughout the cervical spine as above. Electronically Signed   By: Valetta Mole M.D.   On: 12/02/2021 15:21  Positive ROS: All other systems have been reviewed and were otherwise negative with the exception of those mentioned in the HPI and as above.  Physical Exam: General:  Alert, no acute distress Psychiatric:  Patient is competent for consent with normal mood and affect   Cardiovascular:  No pedal edema Respiratory:  No wheezing, non-labored breathing GI:  Abdomen is soft and non-tender Skin:  No lesions in the area of chief complaint Neurologic:  Sensation intact distally Lymphatic:  No axillary or cervical lymphadenopathy  Orthopedic Exam:  Orthopedic examination is limited to the right knee and lower extremity.  The patient holds her knee in approximately 45 degrees of flexion and is unwilling to flex or extend the knee more than 5 to 10 degrees without experiencing moderate to severe pain.  Skin inspection around the right knee is notable for some swelling as well as a very large effusion.  There also is a well-healed anterolateral surgical incision secondary to a prior open lateral meniscectomy.  She has mild warmth and tenderness to palpation diffusely around the knee.  However, there is no erythema, abrasions, ecchymosis, or other skin abnormalities identified.  Skin inspection of the lower leg demonstrates slightly increased erythema around the lower shin and ankle region.  She has a dry scabbed area over the  plantar tip of her great toe which apparently has been treated for a nonhealing ulcer.  Otherwise, the patient is neurovascularly intact to the right lower extremity and foot.  X-rays:  Recent AP and lateral x-rays of the right knee are available for review and have been reviewed by myself.  These films demonstrate significant degenerative joint disease with complete loss of the lateral compartment clear space and significant spur formation.  No fractures, lytic lesions or other acute bony abnormalities are identified.  Assessment: Acute right knee effusion secondary to gout versus sepsis.  Plan: The treatment options have been discussed with the patient, including both observation with continued IV antibiotics and the addition of IV steroids, versus an arthroscopic irrigation and debridement of the knee.  She understands that the results of the arthrocentesis performed by the ER provider last night are ambiguous and do not clearly differentiate between pseudogout versus sepsis.  Although the white count was greater than 60,000 white cells, calcium pyrophosphate crystals were identified and no organisms were seen on the gram stain.  I have explained to her that the more conservative option in this situation would indeed be to arthroscopically flush out her knee.  However, the patient would like to discuss these options with her daughter before committing to 1 direction or the other.  Thank you for asking me to participate in the care of this most pleasant yet unfortunate woman.  I will be happy to follow her with you.   Pascal Lux, MD  Beeper #:  [phone]  12/03/2021 12:13 PM   Addendum: After extensive conversations with the patient and her daughter, the patient has decided to hold off on surgery at this time.  Therefore, we will await the culture results per the patient's preference.  Meanwhile, the patient is to continue to receive IV antibiotics to treat her presumed  infection.  In addition, she might benefit from some IV steroids to treat the pseudogout component of her symptoms.  She may eat at this time.   Pascal Lux, MD  Beeper #:  [phone]  12/03/2021 13:54 PM

## 2021-12-03 NOTE — TOC CM/SW Note (Signed)
°  Transition of Care [facility]) Screening Note   Patient Details  Name: Laura Santiago Date of Birth: [birth year]   [facility]) CM/SW Contact:    Magnus Ivan, LCSW Phone Number: 12/03/2021, 12:14 PM    Transition of Care Department [facility]) has reviewed patient and no TOC needs have been identified at this time. We will continue to monitor patient advancement through interdisciplinary progression rounds. If new patient transition needs arise, please place a TOC consult.

## 2021-12-03 NOTE — ED Notes (Signed)
Pt asleep in bed at this time, vss

## 2021-12-03 NOTE — ED Notes (Signed)
Pt eating meal tray 

## 2021-12-03 NOTE — Progress Notes (Signed)
Patient ID: Laura Santiago, female   DOB: [birth year], 75 y.o.   MRN: [id number]  During skin assessment found two fentanyl patches on patient. Patches removed and disposed per policy.  Haydee Salter, RN

## 2021-12-03 NOTE — Consult Note (Signed)
Pharmacy Antibiotic Note  Laura Santiago is a 75 y.o. female admitted on 12/02/2021 with sepsis secondary to a right knee infection.  Pharmacy has been consulted for vancomycin and cefepime dosing.  Plan: Continue vancomycin 750 mg IV q24h  Goal AUC 400-550  Est AUC: 493 Calculated with SCr 1.19  Cefepime 2 g IV q12h  Monitor clinical picture, renal function, and vancomycin levels at steady state F/U C&S, abx deescalation / LOT   Height: 5\' 2"  (157.5 cm) Weight: 69.4 kg (153 lb) IBW/kg (Calculated) : 50.1  Temp (24hrs), Avg:100.3 F (37.9 C), Min:100.3 F (37.9 C), Max:100.3 F (37.9 C)  Recent Labs  Lab 12/02/21 1018 12/02/21 1023 12/02/21 1520 12/03/21 0717  WBC 10.7*  --   --  8.5  CREATININE 1.33*  --   --  1.19*  LATICACIDVEN  --  2.1* 0.9  --      Estimated Creatinine Clearance: 36.1 mL/min (A) (by C-G formula based on SCr of 1.19 mg/dL (H)).    Allergies  Allergen Reactions   Corticosteroids Other (See Comments)    Pt trf from [name] and per primary md for her cancer tx. Notes that it causes agitation intolerance   Sulfa Antibiotics Other (See Comments)    Pt moved from [name] and in MD notes she has allergy but we do not know reactions when taking the drug   Celebrex [Celecoxib] Rash    Antimicrobials this admission: 12/24 cefepime >> 12/23 vancomycin >>  12/23 cefepime/flagyl x 1    Microbiology results: 12/23 BCx: NG pending 12/23 Synovial fluid cx: NG pending   Thank you for allowing pharmacy to be a part of this patients care.  Tawnya Crook, PharmD, BCPS Clinical Pharmacist 12/03/2021 9:30 AM

## 2021-12-04 LAB — PROTEIN, BODY FLUID (OTHER): Total Protein, Body Fluid Other: 3.4 g/dL

## 2021-12-04 LAB — CREATININE, SERUM
Creatinine, Ser: 1.07 mg/dL — ABNORMAL HIGH (ref 0.44–1.00)
GFR, Estimated: 53 mL/min — ABNORMAL LOW (ref >60)

## 2021-12-04 LAB — GLUCOSE, BODY FLUID OTHER: Glucose, Body Fluid Other: 8 mg/dL

## 2021-12-04 MED ORDER — OXYCODONE HCL 5 MG PO TABS
5.0000 mg | ORAL_TABLET | ORAL | Status: DC | PRN
Start: 2021-12-04 — End: 2021-12-08
  Administered 2021-12-04 – 2021-12-06 (×10): 10 mg via ORAL
  Administered 2021-12-06: 11:00:00 5 mg via ORAL
  Administered 2021-12-07 – 2021-12-08 (×9): 10 mg via ORAL
  Filled 2021-12-04 (×16): qty 2
  Filled 2021-12-04: qty 1
  Filled 2021-12-04 (×3): qty 2

## 2021-12-04 MED ORDER — FENTANYL 12 MCG/HR TD PT72
1.0000 | MEDICATED_PATCH | TRANSDERMAL | Status: DC
  Administered 2021-12-04 – 2021-12-07 (×2): 1 via TRANSDERMAL
  Filled 2021-12-04 (×4): qty 1

## 2021-12-04 MED ORDER — CHLORHEXIDINE GLUCONATE CLOTH 2 % EX PADS
6.0000 | MEDICATED_PAD | Freq: Every day | CUTANEOUS | Status: DC
  Administered 2021-12-04 – 2021-12-08 (×5): 6 via TOPICAL

## 2021-12-04 MED ORDER — HYDROXYZINE HCL 10 MG PO TABS
10.0000 mg | ORAL_TABLET | Freq: Three times a day (TID) | ORAL | Status: DC | PRN
  Administered 2021-12-04 – 2021-12-08 (×10): 10 mg via ORAL
  Filled 2021-12-04 (×11): qty 1

## 2021-12-04 MED ORDER — ENOXAPARIN SODIUM 40 MG/0.4ML IJ SOSY
40.0000 mg | PREFILLED_SYRINGE | INTRAMUSCULAR | Status: DC
  Administered 2021-12-04 – 2021-12-08 (×4): 40 mg via SUBCUTANEOUS
  Filled 2021-12-04 (×5): qty 0.4

## 2021-12-04 NOTE — Plan of Care (Signed)

## 2021-12-04 NOTE — Plan of Care (Signed)

## 2021-12-04 NOTE — Progress Notes (Signed)
No orders for wound care, consult to be done tomorrow

## 2021-12-04 NOTE — Progress Notes (Signed)
PROGRESS NOTE    Laura Santiago  JIR:[id number] DOB: Dec 25, 1945 DOA: 12/02/2021 PCP: Orvis Brill, [facility]    Chief Complaint  Patient presents with   Knee Pain   Neck Pain    Brief Narrative:   Laura Santiago is a 75 y.o. female with medical history significant for DVT on Xarelto, stage IIIb CKD, chronic venous stasis, essential hypertension, metastatic breast cancer (ER weakly +10%, PR negative and HER2 positive) and chronic leg wound who presents to the emergency department due to right knee pain. In the emergency department, she was tachypneic, but hemodynamically stable.  Work-up in the ED showed mild leukocytosis, normocytic anemia, hyperglycemia, BUN/creatinine 18/1.33 (creatinine is within baseline range).  Lactic acid 2.1 > 0.9, synovial fluid had a turbid appearance, WBC was 60,425.  Influenza A, B, SARS coronavirus 2 was negative.  CT cervical spine without contrast showed no acute fracture or traumatic malalignment of the cervical spine. CT head without contrast showed no acute intracranial pathology Chest x-ray showed no active cardiopulmonary disease  Right knee X RAYS  showed Tricompartmental severe degenerative changes of the right knee with associated joint effusion. Patient was started on IV cefepime, Flagyl and vancomycin.  Orthopedics consulted for possible septic arthritis. Plan for arthroscopic irrigation and debridement as per dr Roland Rack.    Assessment & Plan:   Principal Problem:   Infection of right knee (HCC) Active Problems:   Metastatic breast cancer ([place])   Cellulitis of right lower extremity   Lactic acidosis   Essential hypertension   SIRS (systemic inflammatory response syndrome) (HCC)   Sepsis probably secondary to a combination of mild right leg cellulitis vs from right septic arthritis.  Arthrocentesis revealed yellow turbid liquid with 68,425 WBCs, Intracellular calcium pyrophosphate crystals.  Fluid sent for cultures and pending.   MRI of the right knee shows Advanced tricompartmental osteoarthritis, most severe within the lateral compartment. Complex tearing/maceration of the visualized portions of the lateral meniscus. Vertical tear involving the peripheral aspect of the medial meniscal posterior horn. Large joint effusion with a few intra-articular loose bodies. Internal heterogeneity within the joint suggests synovitis. Findings may be reactive secondary to underlying arthropathy. Septic arthritis is not entirely excluded.  Orthopedics consulted for further evaluation.  Pain control and started on broad spectrum IV antibiotics.  Follow cultures and cultures of the synovial fluid.  Pt reports right knee pain has resolved. Cellulitis of the lower extremity on the right resolved.      Metastatic  right breast cancer On chemotherapy. Continue with oxycodone and fentanyl patch. Follows up with Dr. Janese Banks as outpatient    Stage 3 b CKD Creatinine at baseline   Essential hypertension Blood pressure parameters are well controlled.     Mild right lower extremity cellulitis Appears to have resolved.  Continue with IV antibiotics.    History of chronic left lower extremity venous stasis ulcer Wound care consulted for recommendations.   History of DVT Pt reports she had another vascular study , was negative for DVT and she was asked by her doc to stop the xarelto.    DVT prophylaxis:Lovenox.  Code Status: full code.  Family Communication:none at bedside.  Disposition:   Status is: Inpatient  Remains inpatient appropriate because: IV antibiotics, further work up       Consultants:  Orthopedics.   Procedures: arthrocentesis by EDP.   Antimicrobials:  Antibiotics Given (last 72 hours)     Date/Time Action Medication Dose Rate   12/02/21 1544 New Bag/Given  ceFEPIme (MAXIPIME) 2 g in sodium chloride 0.9 % 100 mL IVPB 2 g 200 mL/hr   12/02/21 1611 New Bag/Given   vancomycin (VANCOREADY) IVPB  1500 mg/300 mL 1,500 mg 150 mL/hr   12/02/21 1612 New Bag/Given   metroNIDAZOLE (FLAGYL) IVPB 500 mg 500 mg 100 mL/hr   12/03/21 1004 New Bag/Given   ceFEPIme (MAXIPIME) 2 g in sodium chloride 0.9 % 100 mL IVPB 2 g 200 mL/hr   12/03/21 2149 New Bag/Given   ceFEPIme (MAXIPIME) 2 g in sodium chloride 0.9 % 100 mL IVPB 2 g 200 mL/hr   12/03/21 2300 New Bag/Given   vancomycin (VANCOREADY) IVPB 750 mg/150 mL 750 mg 150 mL/hr   12/04/21 0851 New Bag/Given   ceFEPIme (MAXIPIME) 2 g in sodium chloride 0.9 % 100 mL IVPB 2 g 200 mL/hr         Subjective: Improving right knee pain.   Objective: Vitals:   12/03/21 1605 12/03/21 2147 12/04/21 0532 12/04/21 0815  BP: 130/84 112/61 112/60 125/71  Pulse: 93 (!) 101 78 73  Resp: $Remo'16 20 18 18  'QDzSg$ Temp: 99.8 F (37.7 C) 98.7 F (37.1 C) 98 F (36.7 C) 98.1 F (36.7 C)  TempSrc: Oral Oral Oral Oral  SpO2: 96% 97% 100% 99%  Weight:      Height:        Intake/Output Summary (Last 24 hours) at 12/04/2021 1317 Last data filed at 12/04/2021 0853 Gross per 24 hour  Intake 0 ml  Output 1000 ml  Net -1000 ml    Filed Weights   12/02/21 1011  Weight: 69.4 kg    Examination:  General exam: Appears calm and comfortable  Respiratory system: Clear to auscultation. Respiratory effort normal. Cardiovascular system: S1 & S2 heard, RRR. No JVD,  No pedal edema. Gastrointestinal system: Abdomen is nondistended, soft and nontender.. Normal bowel sounds heard. Central nervous system: Alert and oriented. No focal neurological deficits. Extremities: right knee swelling, left lower extremity bandaged.  Skin:  Chronic left lower extremity ulcer bandaged.  Psychiatry: Mood & affect appropriate.     Data Reviewed: I have personally reviewed following labs and imaging studies  CBC: Recent Labs  Lab 12/02/21 1018 12/03/21 0717  WBC 10.7* 8.5  NEUTROABS 9.5*  --   HGB 10.3* 9.2*  HCT 33.7* 29.3*  MCV 90.6 89.3  PLT 272 239     Basic  Metabolic Panel: Recent Labs  Lab 12/02/21 1018 12/03/21 0717 12/04/21 0611  NA 136 135  --   K 4.0 4.0  --   CL 104 106  --   CO2 24 22  --   GLUCOSE 125* 127*  --   BUN 18 19  --   CREATININE 1.33* 1.19* 1.07*  CALCIUM 8.2* 7.7*  --   MG  --  1.7  --   PHOS  --  2.7  --      GFR: Estimated Creatinine Clearance: 40.2 mL/min (A) (by C-G formula based on SCr of 1.07 mg/dL (H)).  Liver Function Tests: Recent Labs  Lab 12/02/21 1018 12/03/21 0717  AST 22 15  ALT 11 10  ALKPHOS 71 59  BILITOT 0.9 0.9  PROT 7.5 6.2*  ALBUMIN 3.7 2.8*     CBG: No results for input(s): GLUCAP in the last 168 hours.   Recent Results (from the past 240 hour(s))  Resp Panel by RT-PCR (Flu A&B, Covid) Nasopharyngeal Swab     Status: None   Collection Time: 12/02/21  3:20 PM  Specimen: Nasopharyngeal Swab; Nasopharyngeal(NP) swabs in vial transport medium  Result Value Ref Range Status   SARS Coronavirus 2 by RT PCR NEGATIVE NEGATIVE Final    Comment: (NOTE) SARS-CoV-2 target nucleic acids are NOT DETECTED.  The SARS-CoV-2 RNA is generally detectable in upper respiratory specimens during the acute phase of infection. The lowest concentration of SARS-CoV-2 viral copies this assay can detect is 138 copies/mL. A negative result does not preclude SARS-Cov-2 infection and should not be used as the sole basis for treatment or other patient management decisions. A negative result may occur with  improper specimen collection/handling, submission of specimen other than nasopharyngeal swab, presence of viral mutation(s) within the areas targeted by this assay, and inadequate number of viral copies(<138 copies/mL). A negative result must be combined with clinical observations, patient history, and epidemiological information. The expected result is Negative.  Fact Sheet for Patients:  EntrepreneurPulse.com.au  Fact Sheet for Healthcare Providers:   IncredibleEmployment.be  This test is no t yet approved or cleared by the [place] FDA and  has been authorized for detection and/or diagnosis of SARS-CoV-2 by FDA under an Emergency Use Authorization (EUA). This EUA will remain  in effect (meaning this test can be used) for the duration of the COVID-19 declaration under Section 564(b)(1) of the Act, 21 U.S.C.section 360bbb-3(b)(1), unless the authorization is terminated  or revoked sooner.       Influenza A by PCR NEGATIVE NEGATIVE Final   Influenza B by PCR NEGATIVE NEGATIVE Final    Comment: (NOTE) The Xpert Xpress SARS-CoV-2/FLU/RSV plus assay is intended as an aid in the diagnosis of influenza from Nasopharyngeal swab specimens and should not be used as a sole basis for treatment. Nasal washings and aspirates are unacceptable for Xpert Xpress SARS-CoV-2/FLU/RSV testing.  Fact Sheet for Patients: EntrepreneurPulse.com.au  Fact Sheet for Healthcare Providers: IncredibleEmployment.be  This test is not yet approved or cleared by the [place] FDA and has been authorized for detection and/or diagnosis of SARS-CoV-2 by FDA under an Emergency Use Authorization (EUA). This EUA will remain in effect (meaning this test can be used) for the duration of the COVID-19 declaration under Section 564(b)(1) of the Act, 21 U.S.C. section 360bbb-3(b)(1), unless the authorization is terminated or revoked.  Performed at [facility], [address] [place], [place] [address]   Culture, blood (routine x 2)     Status: None (Preliminary result)   Collection Time: 12/02/21  3:20 PM   Specimen: BLOOD  Result Value Ref Range Status   Specimen Description BLOOD PORT  Final   Special Requests   Final    BOTTLES DRAWN AEROBIC AND ANAEROBIC Blood Culture results may not be optimal due to an excessive volume of blood received in culture bottles   Culture   Final    NO  GROWTH 2 DAYS Performed at [facility], [address] [place], [place] [address]    Report Status PENDING  Incomplete  Culture, blood (routine x 2)     Status: None (Preliminary result)   Collection Time: 12/02/21  3:20 PM   Specimen: BLOOD  Result Value Ref Range Status   Specimen Description BLOOD LEFT ARM  Final   Special Requests   Final    BOTTLES DRAWN AEROBIC AND ANAEROBIC Blood Culture adequate volume   Culture   Final    NO GROWTH 2 DAYS Performed at [facility], [address] [place], [place] [address]    Report Status PENDING  Incomplete  Body fluid culture w Gram Stain     Status: None (Preliminary result)   Collection Time: 12/02/21  3:20 PM   Specimen: KNEE; Body Fluid  Result Value Ref Range Status   Specimen Description   Final    KNEE RIGHT Performed at [facility], [address], [place], [place] [address]    Special Requests   Final    NONE Performed at [facility], [address] [place], [place] [address]    Gram Stain   Final    ABUNDANT WBC PRESENT, PREDOMINANTLY PMN NO ORGANISMS SEEN    Culture   Final    NO GROWTH 2 DAYS Performed at [facility], [place] [address], [place], [place] [address]    Report Status PENDING  Incomplete          Radiology Studies: DG Chest 2 View  Result Date: 12/02/2021 CLINICAL DATA:  Sepsis EXAM: CHEST - 2 VIEW COMPARISON:  07/01/2021 FINDINGS: Left-sided Port-A-Cath in stable positioning. The heart size and mediastinal contours are within normal limits. Chronic elevation of the right hemidiaphragm. No focal airspace consolidation, pleural effusion, or pneumothorax. No appreciable interval change in appearance of sclerotic osseous metastatic disease. IMPRESSION: No active cardiopulmonary disease. Electronically Signed   By: Davina Poke D.O.   On: 12/02/2021 15:12   DG Knee 2 Views Right  Result Date: 12/02/2021 CLINICAL DATA:  Pain and swelling EXAM:  RIGHT KNEE - 1-2 VIEW COMPARISON:  None. FINDINGS: No evidence of fracture or dislocation. At least moderate volume joint effusion. Severe tricompartmental degenerative changes. No aggressive appearing focal bone abnormality. Soft tissues are unremarkable. IMPRESSION: 1. Tricompartmental severe degenerative changes of the right knee with associated joint effusion. 2.  No acute displaced fracture or dislocation. Electronically Signed   By: Iven Finn M.D.   On: 12/02/2021 15:09   CT Head Wo Contrast  Result Date: 12/02/2021 CLINICAL DATA:  Altered mental status, neck pain EXAM: CT HEAD WITHOUT CONTRAST TECHNIQUE: Contiguous axial images were obtained from the base of the skull through the vertex without intravenous contrast. COMPARISON:  Brain MRI 11/28/2021 FINDINGS: Brain: There is no evidence of acute intracranial hemorrhage, extra-axial fluid collection, or acute infarct. Parenchymal volume is normal. The ventricles are normal in size. There is no mass lesion. There is no midline shift. Vascular: No hyperdense vessel or unexpected calcification. Skull: Normal. Negative for fracture or focal lesion. Sinuses/Orbits: The paranasal sinuses are clear. Bilateral lens implants are in place. The globes and orbits are otherwise unremarkable. Other: None. IMPRESSION: No acute intracranial pathology. Electronically Signed   By: Valetta Mole M.D.   On: 12/02/2021 15:17   CT Cervical Spine Wo Contrast  Result Date: 12/02/2021 CLINICAL DATA:  Neck pain, history of breast cancer with metastatic disease EXAM: CT CERVICAL SPINE WITHOUT CONTRAST TECHNIQUE: Multidetector CT imaging of the cervical spine was performed without intravenous contrast. Multiplanar CT image reconstructions were also generated. COMPARISON:  None. FINDINGS: Alignment: There is trace retrolisthesis of C3 on C4 and grade 1 anterolisthesis of C4 on C5. There is no jumped or perched facets or other evidence of traumatic malalignment. Skull base  and vertebrae: Skull base alignment is maintained. Vertebral body heights are preserved. There is partial fusion of the C2 and C3 vertebral bodies. There is no evidence of acute fracture. There is extensive sclerosis throughout the cervical spine below the C3 level consistent with diffuse osseous metastatic disease. Soft tissues and spinal canal: No prevertebral fluid or swelling. No visible  canal hematoma. Disc levels: There is mild multilevel degenerative endplate change and facet arthropathy throughout the cervical spine, most advanced at C4-C5 through C6-C7. The osseous spinal canal is patent. There is moderate to severe bilateral neural foraminal stenosis at C4-C5 through C6-C7. Upper chest: The imaged lung apices are clear. Other: Left chest port tubing is partially imaged. IMPRESSION: 1. No acute fracture or traumatic malalignment of the cervical spine. 2. Extensive sclerosis throughout the cervical spine below the C3 level consistent with extensive osseous metastatic disease. 3. Grade 1 anterolisthesis of C4 on C5 retrolisthesis of C3 on C4 and grade 1 anterolisthesis of C4 on C5, likely degenerative in nature. 4. Multilevel degenerative changes throughout the cervical spine as above. Electronically Signed   By: Valetta Mole M.D.   On: 12/02/2021 15:21   MR KNEE RIGHT WO CONTRAST  Result Date: 12/03/2021 CLINICAL DATA:  Right knee pain and swelling. Septic arthritis suspected, knee, xray done EXAM: MRI OF THE RIGHT KNEE WITHOUT CONTRAST TECHNIQUE: Multiplanar, multisequence MR imaging of the knee was performed. No intravenous contrast was administered. COMPARISON:  X-ray 12/02/2021 FINDINGS: Technical Note: Despite efforts by the technologist and patient, motion artifact is present on today's exam and could not be eliminated. This reduces exam sensitivity and specificity. MENISCI Medial meniscus: Vertical tear involving the peripheral aspect of the medial meniscal posterior horn extending towards the  body segment (series 18, images 26-29). Medial meniscus is diminutive and degenerated in appearance. Lateral meniscus: Complex tearing/maceration of the visualized portions of the lateral meniscus. No significant meniscal tissue is seen in the region of the lateral meniscal body. LIGAMENTS Cruciates: ACL is markedly degenerated or torn.  PCL intact. Collaterals: Intact MCL. Proximal fibular collateral ligament is ill-defined, degenerated or torn. Distal attachment the IT band is also degenerated in appearance. Lateral collateral ligament complex otherwise intact. CARTILAGE Patellofemoral: High-grade chondral loss within the trochlear groove. Medial: Partial-thickness chondral loss and irregularity of the weight-bearing medial compartment. Lateral: Extensive full-thickness cartilage loss of the lateral compartment with remodeling of the lateral tibial plateau. MISCELLANEOUS Joint: Large joint effusion with a few intra-articular loose bodies. Internal heterogeneity within the joint suggests synovitis. Edematous appearance of Hoffa's fat. Popliteal Fossa: Trace fluid within a Baker's cyst. Grossly intact popliteus tendon. Extensor Mechanism:  Intact quadriceps and patellar tendons. Bones: Tricompartmental joint space loss with bulky marginal osteophytes, most pronounced within the lateral compartment. Patchy subchondral bone marrow edema about the knee, most pronounced laterally, favored to be degenerative/reactive. No fracture. No suspicious marrow replacing bone lesion. Other: Multiple ganglia are seen along the posterior aspect of the knee and extending inferiorly from the proximal tibiofibular joint. Circumferential subcutaneous edema, nonspecific. IMPRESSION: 1. Advanced tricompartmental osteoarthritis, most severe within the lateral compartment. 2. Complex tearing/maceration of the visualized portions of the lateral meniscus. 3. Vertical tear involving the peripheral aspect of the medial meniscal posterior horn.  4. Large joint effusion with a few intra-articular loose bodies. Internal heterogeneity within the joint suggests synovitis. Findings may be reactive secondary to underlying arthropathy. Septic arthritis is not entirely excluded. Correlate with arthrocentesis results. Electronically Signed   By: Davina Poke D.O.   On: 12/03/2021 13:56        Scheduled Meds:  Chlorhexidine Gluconate Cloth  6 each Topical Daily   enoxaparin (LOVENOX) injection  40 mg Subcutaneous Q24H   fentaNYL  1 patch Transdermal Q72H   lisinopril  20 mg Oral Daily   Continuous Infusions:  sodium chloride 75 mL/hr at 12/04/21 0855   ceFEPime (  MAXIPIME) IV 2 g (12/04/21 0851)   vancomycin 750 mg (12/03/21 2300)     LOS: 2 days        Hosie Poisson, MD [facility]   To contact the attending provider between 7A-7P or the covering provider during after hours 7P-7A, please log into the web site [email or web address] and access using universal [place] password for that web site. If you do not have the password, please call the hospital operator.  12/04/2021, 1:17 PM

## 2021-12-04 NOTE — Progress Notes (Signed)
Patient ID: Laura Santiago, female   DOB: 03-26-1946, 75 y.o.   MRN: [id number]  Subjective: The patient feels that her knee symptoms are improved today as compared to yesterday.  Specifically, she feels that she can move her knee more freely and with less pain. She denies any new complaints.  Objective: Vital signs in last 24 hours: Temp:  [98 F (36.7 C)-99.8 F (37.7 C)] 98.1 F (36.7 C) (12/25 0815) Pulse Rate:  [73-101] 73 (12/25 0815) Resp:  [16-20] 18 (12/25 0815) BP: (112-130)/(60-84) 125/71 (12/25 0815) SpO2:  [96 %-100 %] 99 % (12/25 0815)  Intake/Output from previous day: 12/24 0701 - 12/25 0700 In: 0  Out: 1000 [Urine:1000] Intake/Output this shift: Total I/O In: -  Out: 800 [Urine:800]  Recent Labs    12/02/21 1018 12/03/21 0717  HGB 10.3* 9.2*   Recent Labs    12/02/21 1018 12/03/21 0717  WBC 10.7* 8.5  RBC 3.72* 3.28*  HCT 33.7* 29.3*  PLT 272 239   Recent Labs    12/02/21 1018 12/03/21 0717 12/04/21 0611  NA 136 135  --   K 4.0 4.0  --   CL 104 106  --   CO2 24 22  --   BUN 18 19  --   CREATININE 1.33* 1.19* 1.07*  GLUCOSE 125* 127*  --   CALCIUM 8.2* 7.7*  --    No results for input(s): LABPT, INR in the last 72 hours.  Physical Exam: Orthopedic examination again is limited to the right knee and lower extremity.  Skin inspection again demonstrates a tense effusion of the knee, but there is no overlying erythema, ecchymosis, abrasions, or other skin abnormalities.  She has only minimal tenderness to palpation around the knee.  Actively, she is able to extend her knee to within 30 degrees and flex her knee to 65 degrees,  X-rays: A recent MRI scan of the right knee was obtained yesterday and reviewed by myself.  These findings are as described above.  There is evidence of advanced degenerative joint disease, primarily involving the lateral compartment, with complete loss of the lateral compartment joint space and "remodeling of the lateral  tibial plateau".  There is evidence of tearing of the posterior portion of the medial meniscus as well as "complex tearing/maceration" of the lateral meniscus.  There also is a "large joint effusion with a few intra-articular loose bodies.  Internal heterogeneity within the joint suggests synovitis."  Assessment: Acute knee effusion with underlying advanced degenerative joint disease, somewhat improved symptomatically today.  Plan: The treatment options again are reviewed with the patient.  Given that the patient has remained afebrile, that her white count has decreased somewhat, and that her physical examination findings are improved, specifically that her range of motion is improving, I feel that it is reasonable to honor her request and wait another day to see what her joint fluid cultures show before considering arthroscopic irrigation and debridement.  However, I will make her n.p.o. after midnight just in case.    Today, the patient may be mobilized with physical therapy as symptoms permit, weightbearing as tolerated on the right leg.  She may continue to receive pain medication as deemed appropriate medically.   Marshall Cork Lua Feng 12/04/2021, 11:50 AM

## 2021-12-05 ENCOUNTER — Encounter (INDEPENDENT_AMBULATORY_CARE_PROVIDER_SITE_OTHER): Payer: Self-pay | Admitting: Nurse Practitioner

## 2021-12-05 LAB — BASIC METABOLIC PANEL
Anion gap: 4 — ABNORMAL LOW (ref 5–15)
BUN: 14 mg/dL (ref 8–23)
CO2: 23 mmol/L (ref 22–32)
Calcium: 7.8 mg/dL — ABNORMAL LOW (ref 8.9–10.3)
Chloride: 112 mmol/L — ABNORMAL HIGH (ref 98–111)
Creatinine, Ser: 1.02 mg/dL — ABNORMAL HIGH (ref 0.44–1.00)
GFR, Estimated: 57 mL/min — ABNORMAL LOW (ref >60)
Glucose, Bld: 114 mg/dL — ABNORMAL HIGH (ref 70–99)
Potassium: 3.6 mmol/L (ref 3.5–5.1)
Sodium: 139 mmol/L (ref 135–145)

## 2021-12-05 LAB — CBC
HCT: 26.4 % — ABNORMAL LOW (ref 36.0–46.0)
Hemoglobin: 8.3 g/dL — ABNORMAL LOW (ref 12.0–15.0)
MCH: 27.9 pg (ref 26.0–34.0)
MCHC: 31.4 g/dL (ref 30.0–36.0)
MCV: 88.6 fL (ref 80.0–100.0)
Platelets: 236 10*3/uL (ref 150–400)
RBC: 2.98 MIL/uL — ABNORMAL LOW (ref 3.87–5.11)
RDW: 16.7 % — ABNORMAL HIGH (ref 11.5–15.5)
WBC: 5.3 10*3/uL (ref 4.0–10.5)
nRBC: 0 % (ref 0.0–0.2)

## 2021-12-05 MED ORDER — TRIAMCINOLONE ACETONIDE 40 MG/ML IJ SUSP
80.0000 mg | Freq: Once | INTRAMUSCULAR | Status: DC
  Filled 2021-12-05: qty 2

## 2021-12-05 MED ORDER — DAKINS (1/4 STRENGTH) 0.125 % EX SOLN
CUTANEOUS | Status: AC
  Filled 2021-12-05: qty 473

## 2021-12-05 MED ORDER — COLCHICINE 0.6 MG PO TABS
0.6000 mg | ORAL_TABLET | Freq: Two times a day (BID) | ORAL | Status: DC
  Administered 2021-12-05 – 2021-12-08 (×6): 0.6 mg via ORAL
  Filled 2021-12-05 (×6): qty 1

## 2021-12-05 MED ORDER — BUPIVACAINE HCL (PF) 0.5 % IJ SOLN
10.0000 mL | Freq: Once | INTRAMUSCULAR | Status: DC
  Filled 2021-12-05: qty 10

## 2021-12-05 NOTE — Consult Note (Signed)
[facility] Nurse Consult Note: Reason for Consult: Consult requested for left leg.  Pt states she is followed by the outpatient wound care center prior to admission and is very well-informed regarding topical treatment. We will continue the current plan of care which was ordered by the outpatient wound care center as outlined below.  Wound type: Left calf with chronic full thickness wound; 100% red and moist, 9X6X.2cm, mod amt tan drainage.  Pt should resume follow-up with outpatient wound care center after discharge. Dressing procedure/placement/frequency: WOC will change left leg dressing Q M/W/F as follows: Cleanse wound with Dakins moistened gauze.  Apply barrier cream around wound edges to protect skin, then apply Sorbact dressing soaked with Dakins, cut to fit the size of the wound, and cover with alginate dressing.  Then apply Profore 4 layer compression wraps and "stocknette" over the top. Julien Girt MSN, RN, [place], [place], [place]

## 2021-12-05 NOTE — Progress Notes (Signed)
Subjective:    Patient ID: Laura Santiago, female    DOB: [birth year], 75 y.o.   MRN: [id number] Chief Complaint  Patient presents with   Follow-up    Pt conv BIL venous  reflux      Laura Santiago is a 75 year old female that presents today as a referral from her infectious disease specialist in regards to a persistent ulceration.  Originally this began as a small venous ulceration however this ulceration has been present for approximately 6 years on and off.  Per the patient it will heal and then after several months it erupts again.  The patient previously had ABIs done on 07/11/2021 which showed an ABI of 1.16 on the right and 1.37 on the left.  The patient had a TBI 0.96 on the right and 0.89 on the left.  The patient had triphasic tibial artery waveforms with normal toe waveforms bilaterally.  Based on this the patient should have adequate arterial perfusion for wound healing.  Per the patient this is also consistent with previous studies that she had [place].  The patient has a concern that it may be related to a persistent fungal infection causing her eruption on the wound.  The patient is also on ongoing treatment for metastatic breast cancer.  The patient has been working with wound care on an ongoing basis.  She notes that most of her swelling has been in her right lower extremity but she has had a history of DVTs bilaterally.  Today noninvasive studies show evidence of reflux in the mid great saphenous vein at the mid thigh.  Evidence of old DVT in the left lower extremity.  No evidence of deep venous insufficiency bilaterally.  The right lower extremity also has evidence of great saphenous vein reflux.   Review of Systems  Cardiovascular:  Positive for leg swelling.  Skin:  Positive for wound.  All other systems reviewed and are negative.     Objective:   Physical Exam Vitals reviewed.  HENT:     Head: Normocephalic.  Cardiovascular:     Rate and  Rhythm: Normal rate.     Pulses: Normal pulses.  Pulmonary:     Effort: Pulmonary effort is normal.  Musculoskeletal:     Right lower leg: 1+ Edema present.  Skin:    General: Skin is warm and dry.       Neurological:     Mental Status: She is alert and oriented to person, place, and time.     Motor: Weakness present.     Gait: Gait abnormal.  Psychiatric:        Mood and Affect: Mood normal.        Behavior: Behavior normal.        Thought Content: Thought content normal.        Judgment: Judgment normal.    BP (!) 145/85    Pulse 88    Ht 5\' 2"  (1.575 m)    Wt 153 lb (69.4 kg)    BMI 27.98 kg/m   Past Medical History:  Diagnosis Date   ADHD (attention deficit hyperactivity disorder)    in [place], no date on md note   Anemia    IDA 11/26/2019, Anemia in stage 4 chronic kidney disease ([place]) 09/03/2020   Arthritis    osteoarthritis right knee 09/30/2014   Breast cancer ([place]) 10/05/2013   in [place] -, Her 2 is 3+   Cancer related pain 03/28/2021  in [name], md notes spine mets   DVT of lower extremity, bilateral ([place]) 03/30/2014   in [name]   Generalized muscle weakness 03/31/2016   in [name]   GERD (gastroesophageal reflux disease) 05/15/2013   per md in [name]   Hyperparathyroidism, secondary ([place]) 04/25/2018   in [name]   Hypertension 02/20/2016   info from MD in [facility]   Lumbar compression fracture ([place]) 03/18/2021   in [place] loss 02/05/2015   in [name]   Metabolic acidosis 47/42/5956   in [place]   Metabolic syndrome 38/75/6433   in [name]   Osteopenia after menopause 03/29/2016   in [place] of both eyes 09/30/2012   per md in [name] where pt. 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   in [name]   Stasis ulcer of left lower extremity ([place]) 03/29/2016   in [place] History   Socioeconomic History   Marital status: Divorced    Spouse name: Not on file   Number of children: Not on file   Years of education: Not on  file   Highest education level: Not on file  Occupational History   Occupation: retired Teacher, music    Comment: In [place]  Tobacco Use   Smoking status: Never   Smokeless tobacco: Never  Vaping Use   Vaping Use: Never used  Substance and Sexual Activity   Alcohol use: Not Currently   Drug use: Never   Sexual activity: Not Currently  Other Topics Concern   Not on file  Social History Narrative   Not on file   Social Determinants of Health   Financial Resource Strain: Not on file  Food Insecurity: Not on file  Transportation Needs: Not on file  Physical Activity: Not on file  Stress: Not on file  Social Connections: Not on file  Intimate Partner Violence: Not on file    Past Surgical History:  Procedure Laterality Date   CESAREAN SECTION     unknown   fibroid removed  N/A    in [place] - unknown date   IR FLUORO GUIDE CV LINE LEFT  07/27/2021   IR PORT REPAIR CENTRAL VENOUS ACCESS DEVICE Left    In [place] N/A 02/25/2014   in [name]   ovary removed      unknown   [place] CATARACT EXTRACAP,INSERT LENS Bilateral  Bilateral 09/05/2012   in [place]     unknown    LUMPECTOMY Right 04/03/2014   in [place]    Family History  Problem Relation Age of Onset   Pancreatic cancer Mother    Stroke Father    Diabetes Father    Hypertension Father    Heart disease Father    Skin cancer Father    Varicose Veins Father    Skin cancer Brother    Cancer - Prostate Brother     Allergies  Allergen Reactions   Corticosteroids Other (See Comments)    Pt trf from [name] and per primary md for her cancer tx. Notes that it causes agitation intolerance   Sulfa Antibiotics Other (See Comments)    Pt moved from [name] and in MD notes she has allergy but we do not know reactions when taking the drug   Celebrex [Celecoxib] Rash    CBC Latest Ref Rng & Units 12/05/2021 12/03/2021 12/02/2021  WBC 4.0 - 10.5 K/uL 5.3 8.5 10.7(H)  Hemoglobin 12.0 -  15.0  g/dL 8.3(L) 9.2(L) 10.3(L)  Hematocrit 36.0 - 46.0 % 26.4(L) 29.3(L) 33.7(L)  Platelets 150 - 400 K/uL 236 239 272      CMP     Component Value Date/Time   NA 139 12/05/2021 0924   K 3.6 12/05/2021 0924   CL 112 (H) 12/05/2021 0924   CO2 23 12/05/2021 0924   GLUCOSE 114 (H) 12/05/2021 0924   BUN 14 12/05/2021 0924   CREATININE 1.02 (H) 12/05/2021 0924   CALCIUM 7.8 (L) 12/05/2021 0924   PROT 6.2 (L) 12/03/2021 0717   ALBUMIN 2.8 (L) 12/03/2021 0717   AST 15 12/03/2021 0717   ALT 10 12/03/2021 0717   ALKPHOS 59 12/03/2021 0717   BILITOT 0.9 12/03/2021 0717   GFRNONAA 57 (L) 12/05/2021 0924     No results found.     Assessment & Plan:   1. Chronic ulcer of lower extremity, left, with unspecified severity ([place]) The patient does not have any evidence of extensive arterial insufficiency that would decrease the swelling of her wound healing.  Reflux studies today did not note reflux in the saphenous veins however it is very minimal on the left lower extremity.  He has of this 1 small area of reflux it is likely not to make a significant difference in terms of her wound healing.  Also complicating this is her history of previous DVTs and present of chronic thrombus.  Ablating the saphenous vein may result in worsening pain, swelling and vascular compromise if she were to have another DVT.  Given that the patient has current metastatic cancer even with treatment of oral anticoagulation, there is always a risk of thrombus formation.  The patient should continue to follow with wound care for wound treatment.  She should also continue to follow with infectious disease.  Her concern for fungal infection I think is certainly valid as she has some white spots that may be concerning for possible tinea versicolor although this distribution is somewhat abnormal.  She does have an upcoming dermatology appointment as well.  We will have patient return for follow-up in 6 months to evaluate her lower  extremity swelling and progress of wound healing.  Patient is advised to follow-up with his sooner if there are issues.  2. Essential hypertension Continue antihypertensive medications as already ordered, these medications have been reviewed and there are no changes at this time.    Current Facility-Administered Medications on File Prior to Visit  Medication Dose Route Frequency Provider Last Rate Last Admin   heparin lock flush 100 UNIT/ML injection            sodium chloride flush (NS) 0.9 % injection 10 mL  10 mL Intracatheter PRN [name], MD       Current Outpatient Medications on File Prior to Visit  Medication Sig Dispense Refill   acetaminophen (TYLENOL) 500 MG tablet Take 500-1,000 mg by mouth every 6 (six) hours as needed for mild pain or moderate pain.     amphetamine-dextroamphetamine (ADDERALL XR) 30 MG 24 hr capsule Take 1 capsule (30 mg total) by mouth daily. 30 capsule 0   Calcium 200 MG TABS Take 1 tablet by mouth daily.     fentaNYL (DURAGESIC) 12 MCG/HR Place 1 patch onto the skin every 3 (three) days. 1 patch to skin 10 patch 0   lisinopril (ZESTRIL) 20 MG tablet Take 20 mg by mouth daily.     LORazepam (ATIVAN) 0.5 MG tablet Take 1 tablet (0.5 mg total)  by mouth daily as needed for anxiety. 30 tablet 0   Multiple Vitamins-Minerals (MULTIVITAMIN ADULTS) TABS Take 1 tablet by mouth daily.     Oxycodone HCl 10 MG TABS TAKE ONE TABLET BY MOUTH EVERY 4 HOURS AS NEEDED 84 tablet 0   sodium hypochlorite (DAKIN'S 1/4 STRENGTH) 0.125 % SOLN Apply 1 application topically as directed. Moisten gauze with solution and wrap wound     terbinafine (LAMISIL AT) 1 % cream Apply 1 application topically 2 (two) times daily. (Patient taking differently: Apply 1 application topically 2 (two) times daily as needed (fungal treatment).) 30 g 5   zolpidem (AMBIEN) 5 MG tablet Take 1 tablet (5 mg total) by mouth at bedtime. (Patient not taking: Reported on 12/02/2021) 30 tablet 0   potassium  chloride SA (KLOR-CON) 20 MEQ tablet Take 1 tablet (20 mEq total) by mouth daily. (Patient not taking: Reported on 11/29/2021) 7 tablet 0    Patient Instructions  ASK Dermatology about Tinea Versicolor /Biopsy on site of leg   ASK wound care about hyperbaric oxygen  ASK Oncologist about cancer treatment and wound healing effective ness No follow-ups on file.   Kris Hartmann, NP

## 2021-12-05 NOTE — Progress Notes (Signed)
PROGRESS NOTE    Laura Santiago  JWJ:[id number] DOB: [birth year] DOA: 12/02/2021 PCP: Orvis Brill, [facility]    Chief Complaint  Patient presents with   Knee Pain   Neck Pain    Brief Narrative:   Laura Santiago is a 75 y.o. female with medical history significant for DVT on Xarelto, stage IIIb CKD, chronic venous stasis, essential hypertension, metastatic breast cancer (ER weakly +10%, PR negative and HER2 positive) and chronic leg wound who presents to the emergency department due to right knee pain. In the emergency department, she was tachypneic, but hemodynamically stable.  Work-up in the ED showed mild leukocytosis, normocytic anemia, hyperglycemia, BUN/creatinine 18/1.33 (creatinine is within baseline range).  Lactic acid 2.1 > 0.9, synovial fluid had a turbid appearance, WBC was 60,425.  Influenza A, B, SARS coronavirus 2 was negative.  CT cervical spine without contrast showed no acute fracture or traumatic malalignment of the cervical spine. CT head without contrast showed no acute intracranial pathology Chest x-ray showed no active cardiopulmonary disease  Right knee X RAYS  showed Tricompartmental severe degenerative changes of the right knee with associated joint effusion. Patient was started on IV cefepime, Flagyl and vancomycin.  Orthopedics consulted for possible septic arthritis. Plan for arthroscopic irrigation and debridement today.    Assessment & Plan:   Principal Problem:   Infection of right knee (HCC) Active Problems:   Metastatic breast cancer ([place])   Cellulitis of right lower extremity   Lactic acidosis   Essential hypertension   SIRS (systemic inflammatory response syndrome) (HCC)   Sepsis probably secondary to a combination of mild right leg cellulitis vs from right septic arthritis.  Arthrocentesis revealed yellow turbid liquid with 68,425 WBCs, Intracellular calcium pyrophosphate crystals.  Fluid sent for cultures and pending.  MRI of the  right knee shows Advanced tricompartmental osteoarthritis, most severe within the lateral compartment. Complex tearing/maceration of the visualized portions of the lateral meniscus. Vertical tear involving the peripheral aspect of the medial meniscal posterior horn. Large joint effusion with a few intra-articular loose bodies. Internal heterogeneity within the joint suggests synovitis. Findings may be reactive secondary to underlying arthropathy. Septic arthritis is not entirely excluded.  Orthopedics consulted for further evaluation.  Pain control and started on broad spectrum IV antibiotics. Pt scheduled for arthroscopic irrigation today.  Follow cultures and cultures of the synovial fluid.  Pt reports right knee pain has improved. Cellulitis of the lower extremity on the right resolved.      Metastatic  right breast cancer On chemotherapy. Continue with oxycodone and fentanyl patch. Follows up with Dr. Janese Banks as outpatient    Stage 3 b CKD Creatinine better than baseline.    Essential hypertension Blood pressure parameters are optimal.     Mild right lower extremity cellulitis Appears to have resolved.  Continue with IV antibiotics.  Anemia of chronic disease:  Baseline hemoglobin around 9, dropped to 8.3 today.  Continue to monitor.     History of chronic left lower extremity venous stasis ulcer Wound care consulted for recommendations.   History of DVT Pt reports she had another vascular study , was negative for DVT and she was asked by her doc to stop the xarelto.    DVT prophylaxis:Lovenox.  Code Status: full code.  Family Communication:none at bedside.  Disposition:   Status is: Inpatient  Remains inpatient appropriate because: IV antibiotics,       Consultants:  Orthopedics.   Procedures: arthrocentesis by EDP.   Antimicrobials:  Antibiotics Given (  last 72 hours)     Date/Time Action Medication Dose Rate   12/02/21 1544 New Bag/Given   ceFEPIme  (MAXIPIME) 2 g in sodium chloride 0.9 % 100 mL IVPB 2 g 200 mL/hr   12/02/21 1611 New Bag/Given   vancomycin (VANCOREADY) IVPB 1500 mg/300 mL 1,500 mg 150 mL/hr   12/02/21 1612 New Bag/Given   metroNIDAZOLE (FLAGYL) IVPB 500 mg 500 mg 100 mL/hr   12/03/21 1004 New Bag/Given   ceFEPIme (MAXIPIME) 2 g in sodium chloride 0.9 % 100 mL IVPB 2 g 200 mL/hr   12/03/21 2149 New Bag/Given   ceFEPIme (MAXIPIME) 2 g in sodium chloride 0.9 % 100 mL IVPB 2 g 200 mL/hr   12/03/21 2300 New Bag/Given   vancomycin (VANCOREADY) IVPB 750 mg/150 mL 750 mg 150 mL/hr   12/04/21 0851 New Bag/Given   ceFEPIme (MAXIPIME) 2 g in sodium chloride 0.9 % 100 mL IVPB 2 g 200 mL/hr   12/04/21 1756 New Bag/Given   vancomycin (VANCOREADY) IVPB 750 mg/150 mL 750 mg 150 mL/hr   12/04/21 2108 New Bag/Given   ceFEPIme (MAXIPIME) 2 g in sodium chloride 0.9 % 100 mL IVPB 2 g 200 mL/hr   12/05/21 0951 New Bag/Given   ceFEPIme (MAXIPIME) 2 g in sodium chloride 0.9 % 100 mL IVPB 2 g 200 mL/hr         Subjective: Knee swelling is persistent, but pain is improving.   Objective: Vitals:   12/04/21 0532 12/04/21 0815 12/04/21 1622 12/04/21 2008  BP: 112/60 125/71 129/77 (!) 156/69  Pulse: 78 73 79 90  Resp: _0 Temp: 98 F (36.7 C) 98.1 F (36.7 C) 98.3 F (36.8 C) 99.3 F (37.4 C)  TempSrc: Oral Oral Oral Oral  SpO2: 100% 99% 99%   Weight:      Height:       No intake or output data in the 24 hours ending 12/05/21 1523  Filed Weights   12/02/21 1011  Weight: 69.4 kg    Examination:  General exam: elderly woman, not in distress.  Respiratory system: Clear to auscultation. Respiratory effort normal. Cardiovascular system: S1 & S2 heard, RRR. No JVD, . No pedal edema. Gastrointestinal system: Abdomen is nondistended, soft and nontender. . Normal bowel sounds heard. Central nervous system: Alert and oriented. No focal neurological deficits. Extremities: right knee swelling, no redness. Tenderness  present.  Skin: left leg ulcer bandaged.  Psychiatry: Mood & affect appropriate.      Data Reviewed: I have personally reviewed following labs and imaging studies  CBC: Recent Labs  Lab 12/02/21 1018 12/03/21 0717 12/05/21 0924  WBC 10.7* 8.5 5.3  NEUTROABS 9.5*  --   --   HGB 10.3* 9.2* 8.3*  HCT 33.7* 29.3* 26.4*  MCV 90.6 89.3 88.6  PLT 272 239 236     Basic Metabolic Panel: Recent Labs  Lab 12/02/21 1018 12/03/21 0717 12/04/21 0611 12/05/21 0924  NA 136 135  --  139  K 4.0 4.0  --  3.6  CL 104 106  --  112*  CO2 24 22  --  23  GLUCOSE 125* 127*  --  114*  BUN 18 19  --  14  CREATININE 1.33* 1.19* 1.07* 1.02*  CALCIUM 8.2* 7.7*  --  7.8*  MG  --  1.7  --   --   PHOS  --  2.7  --   --      GFR: Estimated Creatinine Clearance: 42.1 mL/min (  A) (by C-G formula based on SCr of 1.02 mg/dL (H)).  Liver Function Tests: Recent Labs  Lab 12/02/21 1018 12/03/21 0717  AST 22 15  ALT 11 10  ALKPHOS 71 59  BILITOT 0.9 0.9  PROT 7.5 6.2*  ALBUMIN 3.7 2.8*     CBG: No results for input(s): GLUCAP in the last 168 hours.   Recent Results (from the past 240 hour(s))  Resp Panel by RT-PCR (Flu A&B, Covid) Nasopharyngeal Swab     Status: None   Collection Time: 12/02/21  3:20 PM   Specimen: Nasopharyngeal Swab; Nasopharyngeal(NP) swabs in vial transport medium  Result Value Ref Range Status   SARS Coronavirus 2 by RT PCR NEGATIVE NEGATIVE Final    Comment: (NOTE) SARS-CoV-2 target nucleic acids are NOT DETECTED.  The SARS-CoV-2 RNA is generally detectable in upper respiratory specimens during the acute phase of infection. The lowest concentration of SARS-CoV-2 viral copies this assay can detect is 138 copies/mL. A negative result does not preclude SARS-Cov-2 infection and should not be used as the sole basis for treatment or other patient management decisions. A negative result may occur with  improper specimen collection/handling, submission of specimen  other than nasopharyngeal swab, presence of viral mutation(s) within the areas targeted by this assay, and inadequate number of viral copies(<138 copies/mL). A negative result must be combined with clinical observations, patient history, and epidemiological information. The expected result is Negative.  Fact Sheet for Patients:  EntrepreneurPulse.com.au  Fact Sheet for Healthcare Providers:  IncredibleEmployment.be  This test is no t yet approved or cleared by the [place] FDA and  has been authorized for detection and/or diagnosis of SARS-CoV-2 by FDA under an Emergency Use Authorization (EUA). This EUA will remain  in effect (meaning this test can be used) for the duration of the COVID-19 declaration under Section 564(b)(1) of the Act, 21 U.S.C.section 360bbb-3(b)(1), unless the authorization is terminated  or revoked sooner.       Influenza A by PCR NEGATIVE NEGATIVE Final   Influenza B by PCR NEGATIVE NEGATIVE Final    Comment: (NOTE) The Xpert Xpress SARS-CoV-2/FLU/RSV plus assay is intended as an aid in the diagnosis of influenza from Nasopharyngeal swab specimens and should not be used as a sole basis for treatment. Nasal washings and aspirates are unacceptable for Xpert Xpress SARS-CoV-2/FLU/RSV testing.  Fact Sheet for Patients: EntrepreneurPulse.com.au  Fact Sheet for Healthcare Providers: IncredibleEmployment.be  This test is not yet approved or cleared by the [place] FDA and has been authorized for detection and/or diagnosis of SARS-CoV-2 by FDA under an Emergency Use Authorization (EUA). This EUA will remain in effect (meaning this test can be used) for the duration of the COVID-19 declaration under Section 564(b)(1) of the Act, 21 U.S.C. section 360bbb-3(b)(1), unless the authorization is terminated or revoked.  Performed at [facility], [address]  [place], [place] [address]   Culture, blood (routine x 2)     Status: None (Preliminary result)   Collection Time: 12/02/21  3:20 PM   Specimen: BLOOD  Result Value Ref Range Status   Specimen Description BLOOD PORT  Final   Special Requests   Final    BOTTLES DRAWN AEROBIC AND ANAEROBIC Blood Culture results may not be optimal due to an excessive volume of blood received in culture bottles   Culture   Final    NO GROWTH 3 DAYS Performed at [facility], [address], [place], [place] [address]    Report Status  PENDING  Incomplete  Culture, blood (routine x 2)     Status: None (Preliminary result)   Collection Time: 12/02/21  3:20 PM   Specimen: BLOOD  Result Value Ref Range Status   Specimen Description BLOOD LEFT ARM  Final   Special Requests   Final    BOTTLES DRAWN AEROBIC AND ANAEROBIC Blood Culture adequate volume   Culture   Final    NO GROWTH 3 DAYS Performed at [facility], [address] [place], [place] [address]    Report Status PENDING  Incomplete  Body fluid culture w Gram Stain     Status: None (Preliminary result)   Collection Time: 12/02/21  3:20 PM   Specimen: KNEE; Body Fluid  Result Value Ref Range Status   Specimen Description   Final    KNEE RIGHT Performed at [facility], [address] [place], [place] [address]    Special Requests   Final    NONE Performed at [facility], [address], [place], [place] [address]    Gram Stain   Final    ABUNDANT WBC PRESENT, PREDOMINANTLY PMN NO ORGANISMS SEEN    Culture   Final    NO GROWTH 3 DAYS Performed at [facility], [place] [address], [place], [place] [address]    Report Status PENDING  Incomplete          Radiology Studies: No results found.      Scheduled Meds:  bupivacaine  10 mL Intra-articular Once   Chlorhexidine Gluconate Cloth  6 each Topical Daily   colchicine  0.6 mg Oral BID   enoxaparin (LOVENOX) injection  40 mg Subcutaneous  Q24H   fentaNYL  1 patch Transdermal Q72H   lisinopril  20 mg Oral Daily   sodium hypochlorite   Topical Q M,W,F   triamcinolone acetonide  80 mg Intra-articular Once   Continuous Infusions:  ceFEPime (MAXIPIME) IV 2 g (12/05/21 0951)   vancomycin 750 mg (12/04/21 1756)     LOS: 3 days        Hosie Poisson, MD [facility]   To contact the attending provider between 7A-7P or the covering provider during after hours 7P-7A, please log into the web site [email or web address] and access using universal [place] password for that web site. If you do not have the password, please call the hospital operator.  12/05/2021, 3:23 PM

## 2021-12-05 NOTE — Progress Notes (Signed)
Patient ID: Laura Santiago, female   DOB: 1946/09/18, 75 y.o.   MRN: [id number]  Subjective: The patient feels that her right knee symptoms have improved as compared to yesterday.  However, she continues to have difficulty flexing her knee too far due to the swelling in her knee.  She has no new complaints regarding her right knee.   Objective: Vital signs in last 24 hours: Temp:  [98.3 F (36.8 C)-99.3 F (37.4 C)] 99.3 F (37.4 C) (12/25 2008) Pulse Rate:  [79-90] 90 (12/25 2008) Resp:  [18] 18 (12/25 2008) BP: (129-156)/(69-77) 156/69 (12/25 2008) SpO2:  [99 %] 99 % (12/25 1622)  Intake/Output from previous day: 12/25 0701 - 12/26 0700 In: 0  Out: 800 [Urine:800] Intake/Output this shift: No intake/output data recorded.  Recent Labs    12/03/21 0717 12/05/21 0924  HGB 9.2* 8.3*   Recent Labs    12/03/21 0717 12/05/21 0924  WBC 8.5 5.3  RBC 3.28* 2.98*  HCT 29.3* 26.4*  PLT 239 236   Recent Labs    12/03/21 0717 12/04/21 0611 12/05/21 0924  NA 135  --  139  K 4.0  --  3.6  CL 106  --  112*  CO2 22  --  23  BUN 19  --  14  CREATININE 1.19* 1.07* 1.02*  GLUCOSE 127*  --  114*  CALCIUM 7.7*  --  7.8*   No results for input(s): LABPT, INR in the last 72 hours.  Physical Exam: Orthopedic examination again is limited to the right knee and lower extremity.  Skin inspection is notable for a tense effusion of the knee which might be slightly improved as compared to yesterday.  No overlying erythema, ecchymosis, abrasions, or other skin abnormalities are identified.  There is only minimal tenderness to palpation around the knee.  Actively, she is able to extend her knee to within 30 degrees and flex her knee to 65 degrees with mild pain at the extremes of flexion and extension.  The knee is stable to varus valgus stressing.  She has neurovascularly intact to the right lower extremity and foot.  Assessment: Acute knee effusion with underlying advanced degenerative  joint disease, somewhat improved symptomatically today.  Plan: Given that the patient's culture results from the knee aspiration done several days ago continue to show no evidence of bacterial growth, I feel that the majority of her symptoms are indeed the result of pseudogout rather than sepsis.  Therefore, the patient may eat at this time as we do not need to take her to the operating room to arthroscopically washout her knee.    The patient is offered and accepts an aspiration/injection of the right knee at the bedside.  After obtaining verbal consent, this procedure is performed sterilely.  Approximately 85 cc of mildly turbid straw-colored fluid is aspirated from the knee before the knee was injected with a solution of 8 cc of 0.5% Sensorcaine and 2 cc of Kenalog-40.  The patient tolerated the procedure well.  In fact, prior to leaving the room, the patient states that she felt her knee was already feeling better.  The patient may be mobilized with physical therapy, weightbearing as tolerated on the right leg.  I also would recommend applying ice to the knee frequently.   Marshall Cork Ailea Rhatigan 12/05/2021, 10:33 AM

## 2021-12-05 NOTE — Care Management Important Message (Signed)
Important Message  Patient Details  Name: Laura Santiago MRN: [id number] Date of Birth: 14-Apr-1946   Medicare Important Message Given:  Other (see comment)  Reviewed Medicare IM right with patient.  Patient refused to sign initial copy.  Copy of unsigned Medicare IM left in patient's room for reference.     Dannette Barbara 12/05/2021, 12:13 PM

## 2021-12-05 NOTE — TOC Progression Note (Signed)
Transition of Care [facility]) - Progression Note    Patient Details  Name: SWAYZIE CHOATE MRN: [id number] Date of Birth: 07/07/1946  Transition of Care [facility]) CM/SW [place], [place] Phone Number: [phone] 12/05/2021, 1:20 PM  Clinical Narrative:      CSW received request from unit RN, to peak with patient's daughter about placement, after discharge.  CSW called and left voicemail for [name],[name] (Daughter) [phone] [facility]), and left her my contact number and contact information for [facility] who will cover room tomorrow 12/06/2021.  CSW spoke with patient who is currently living at [facility].  Patient stated she wanted to return out [place], to [facility], where her brother lives, patient is originally from [place]. Patient stated she wanted assistance with moving to [place].  CSW explained to patient we do not assist with that form the hospital, and that she and her daughter would need to discuss that together.  CSW explained what SNF placement and home health services are and the process for both.  Patient verbalized understanding but seemed a little overwhelmed with the information.  CSW told patient that I had called her daughter Ms. [name] and left her a voicemail.  Patient stated she wanted someone to speak with her daughter.  CSW stated I would explain everything to her when we spoke.  Patient seemed calmer, and verbalized understanding.       Expected Discharge Plan and Services                                                 Social Determinants of Health (SDOH) Interventions    Readmission Risk Interventions No flowsheet data found.

## 2021-12-06 DIAGNOSIS — Z515 Encounter for palliative care: Secondary | ICD-10-CM

## 2021-12-06 DIAGNOSIS — Z66 Do not resuscitate: Secondary | ICD-10-CM

## 2021-12-06 DIAGNOSIS — M25561 Pain in right knee: Secondary | ICD-10-CM

## 2021-12-06 LAB — BODY FLUID CULTURE W GRAM STAIN: Culture: NO GROWTH

## 2021-12-06 MED ORDER — AMOXICILLIN-POT CLAVULANATE 875-125 MG PO TABS
1.0000 | ORAL_TABLET | Freq: Two times a day (BID) | ORAL | Status: DC
  Administered 2021-12-06 – 2021-12-08 (×5): 1 via ORAL
  Filled 2021-12-06 (×5): qty 1

## 2021-12-06 NOTE — Plan of Care (Signed)
°  Problem: Clinical Measurements: Goal: Ability to maintain clinical measurements within normal limits will improve Outcome: Progressing Goal: Respiratory complications will improve Outcome: Progressing Goal: Cardiovascular complication will be avoided Outcome: Progressing   Problem: Clinical Measurements: Goal: Respiratory complications will improve Outcome: Progressing   Problem: Clinical Measurements: Goal: Cardiovascular complication will be avoided Outcome: Progressing

## 2021-12-06 NOTE — Consult Note (Addendum)
Consultation Note Date: 12/06/2021   Patient Name: Laura Santiago  DOB: 25-Dec-1945  MRN: [id number]  Age / Sex: 75 y.o., female  PCP: [facility] Referring Physician: Hosie Poisson, MD  Reason for Consultation: Establishing goals of care  HPI/Patient Profile: 75 y.o. female  with past medical history of DVT (Xarelto), CKD, chronic venous stasis, HTN, metastatic breast cancer, and chronic leg wound of left calf admitted on 12/02/2021 with right knee pain from independent living at [facility].  Palliative medicine was consulted to discuss goals of care.   Clinical Assessment and Goals of Care: I have reviewed medical records including EPIC notes, labs and imaging, assessed the patient and then met with patient at bedside to discuss diagnosis prognosis, GOC, EOL wishes, disposition and options.  I introduced Palliative Medicine as specialized medical care for people living with serious illness. It focuses on providing relief from the symptoms and stress of a serious illness. The goal is to improve quality of life for both the patient and the family.  We discussed a brief life review of the patient.  Patient is originally from [name].  She is a retired Dance movement psychotherapist.  She is divorced and has 1 daughter Laura Santiago.  Laura Santiago recently gave birth to her first grandchild, grandson Laura Santiago.  Patient moved from [name] approximately 5 months ago to be of help to daughter and newborn grandson.  However her health problems have deteriorated to the point where she feels more of a hindrance and to help to her daughter.  As far as functional and nutritional status patient endorses that her 2 brothers and lives near her in [name] helped her with all of her activities of daily life as well as transportation to and from the grocery store and doctor appointments.  Since moving to [place],  patient has felt limited in her ability to get out and about.  She also endorses she feels like a burden on her daughter.  We discussed patient's current illness and what it means in the larger context of patient's on-going co-morbidities.  Natural disease trajectory and expectations at EOL were discussed.    I attempted to elicit values and goals of care important to the patient.  Patient wishes to continue with cancer treatments and infusions.  Most recent MRI of brain shows no metastases.  Patient shares that as long as it has not spread to her brain she will continue to treat it.  The difference between aggressive medical intervention and comfort care was considered in light of the patient's goals of care.  Hospice and outpatient palliative services described in detail.  Patient has a clear understanding that hospice would not allow her to continue her treatment of her cancer.  She reports that she will 1-Day enact her hospice benefits since she knows this would be helpful to both her and her daughter for extra care.  However, she is not ready to stop her cancer treatments.  Advance directives, concepts specific to code status,  artificial feeding and hydration, and rehospitalization were considered and discussed.  Patient should a cadriopulmonary death occur she would like to allow a natural death.  She shared she thought her CODE STATUS was already a DNR.  She stated that she does not want her CODE STATUS to remain a full code.  She would like to be a DNR.  CODE STATUS changed in chart and patient advised to bring in advance directives for medical records to download to chart.  Patient shares that she lived in her 2 brothers while in [place].  She shares 1 brother committed suicide by shooting himself and the other 1 died within a month of the patient moving to [place].  Therapeutic silence and active listening provided for patient to share thoughts and emotions regarding this grief as  well as her own health status.   Discussed with patient/family the importance of continued conversation with family and the medical providers regarding overall plan of care and treatment options, ensuring decisions are within the context of the patients values and GOCs.    Hospice and Palliative Care services outpatient were explained and offered.  Patient was under the impression that she already had of palliative care outpatient referral in place.  Patient is being followed by Dr. Janese Banks at [facility].  Palliative medicine team provider Josh Borders to follow as Palliative Support. TOC made aware that patient would like to meet with him during her next oncology visit.  Questions and concerns were addressed. The family was encouraged to call with questions or concerns.   Primary Decision Maker PATIENT  Code Status/Advance Care Planning: DNR  Prognosis:   Unable to determine  Discharge Planning: Return to [facility] with Palliative Services to follow through [facility]  Primary Diagnoses: Present on Admission:  Infection of right knee ([place])  Cellulitis of right lower extremity  Metastatic breast cancer [facility])   Physical Exam Vitals and nursing note reviewed.  Constitutional:      General: She is not in acute distress.    Appearance: Normal appearance. She is normal weight. She is not toxic-appearing.  HENT:     Head: Normocephalic and atraumatic.     Mouth/Throat:     Mouth: Mucous membranes are moist.     Comments: Missing teeth Cardiovascular:     Rate and Rhythm: Normal rate.     Pulses: Normal pulses.  Pulmonary:     Effort: Pulmonary effort is normal.  Abdominal:     Palpations: Abdomen is soft.  Musculoskeletal:     Cervical back: Normal range of motion.     Comments: Generalized weakness, pain and swelling of right knee limits mobility  Skin:    General: Skin is warm.  Neurological:     Mental Status: She is alert and oriented to person, place, and  time. Mental status is at baseline.  Psychiatric:        Mood and Affect: Mood normal.        Behavior: Behavior normal.        Thought Content: Thought content normal.        Judgment: Judgment normal.    Vital Signs: BP (!) 140/91    Pulse 73    Temp 98.6 F (37 C) (Oral)    Resp 18    Ht _0  (1.575 m)    Wt 69.4 kg    SpO2 100%    BMI 27.98 kg/m  Pain Scale: 0-10   Pain Score: 4  SpO2: SpO2: 100 % O2 Device:SpO2: 100 % O2 Flow Rate: .   Palliative Assessment/Data: 50%     I discussed this patient's plan of care with patient, Dr. Karleen Hampshire, PMT NP Mitzie Na.  Thank you for this consult. Palliative medicine will continue to follow and assist holistically.   Time Total: 70 minutes Greater than 50%  of this time was spent counseling and coordinating care related to the above assessment and plan.  Signed by: Jordan Hawks, DNP, FNP-BC Palliative Medicine    Please contact Palliative Medicine Team phone at [phone] for questions and concerns.  For individual provider: See Shea Evans

## 2021-12-06 NOTE — TOC Initial Note (Signed)
Transition of Care [facility]) - Initial/Assessment Note    Patient Details  Name: Laura Santiago MRN: [id number] Date of Birth: 1946-06-25  Transition [facility]) CM/SW Contact:    Beverly Sessions, RN Phone Number: 12/06/2021, 4:57 PM  Clinical Narrative:                 Admitted for: Infection of knee  Admitted from: [facility] PCP: [facility] Current home health/prior home health/DME: Home health through [place], [place] and [facility]  Therapy recommending SNF PASRR obtained Fl2 sent for signature Bed search initiated  Patient requests for daughter to be presented with bed offers when available and choose         Patient Goals and CMS Choice        Expected Discharge Plan and Services                                                Prior Living Arrangements/Services                       Activities of Daily Living Home Assistive Devices/Equipment: None ADL Screening (condition at time of admission) Patient's cognitive ability adequate to safely complete daily activities?: Yes Is the patient deaf or have difficulty hearing?: No Does the patient have difficulty seeing, even when wearing glasses/contacts?: No Does the patient have difficulty concentrating, remembering, or making decisions?: Yes Patient able to express need for assistance with ADLs?: Yes Does the patient have difficulty dressing or bathing?: No Independently performs ADLs?: Yes (appropriate for developmental age) Does the patient have difficulty walking or climbing stairs?: Yes Weakness of Legs: Both Weakness of Arms/Hands: None  Permission Sought/Granted                  Emotional Assessment              Admission diagnosis:  Right knee pain [M25.561] Cellulitis of right lower extremity [L03.115] Infection of right knee ([place]) [M00.9] Sepsis without acute organ dysfunction, due to unspecified organism [facility]) [A41.9] Patient Active Problem List   Diagnosis  Date Noted   Infection of right knee ([place]) 12/02/2021   SIRS (systemic inflammatory response syndrome) ([place]) 12/02/2021   Venous stasis ulcer with fat layer exposed ([place]) 08/29/2021   Tinea pedis of both feet 08/29/2021   Cellulitis of right lower extremity 08/29/2021   Metastatic breast cancer ([place]) 05/26/2021   Back pain 05/17/2021   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   Malignant neoplasm metastatic to bone ([place]) 04/27/2021   Primary insomnia 03/28/2021   Unsteady gait 03/28/2021   Lumbar compression fracture, open, initial encounter ([place]) 03/18/2021   Posterior vitreous detachment of both eyes 02/08/2021   Anemia in stage 4 chronic kidney disease ([place]) 09/03/2020   AKI (acute kidney injury) ([place]) 06/26/2020   Bacteriuria 11/26/2019   Iron deficiency anemia 11/26/2019   Venous stasis ulcer of left calf limited to breakdown of skin with varicose veins ([place]) 10/22/2018   Myopia of both eyes with astigmatism and presbyopia 05/27/2018   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 05/27/2018   Hyperparathyroidism, secondary ([place]) 04/25/2018   Lactic acidosis 01/27/2018   Generalized muscle weakness 03/31/2016   Attention deficit hyperactivity disorder (ADHD), predominantly inattentive type 03/29/2016   Osteopenia 03/29/2016  Essential hypertension 78/97/8478   Metabolic syndrome 41/28/2081   Chronic anticoagulation 11/16/2015   Right leg swelling 11/16/2015   Memory loss 02/05/2015   Atrophy of muscle of multiple sites 09/30/2014   Physical deconditioning 09/30/2014   Primary osteoarthritis of right knee 09/30/2014   History of breast cancer in female 09/14/2014   History of radiation therapy 09/14/2014   Acute embolism and thrombosis of unspecified deep veins of lower extremity, bilateral ([place]) 03/30/2014   Lymphadenopathy, inguinal 08/28/2013   Venous ulcer of ankle, left ([place]) 08/04/2013   GERD (gastroesophageal reflux  disease) 06/02/2013   PCP:  Orvis Brill, [place]:   [place], [place] - [address] [place] [address] Phone: [phone] Fax: [phone]     Social Determinants of Health (SDOH) Interventions    Readmission Risk Interventions No flowsheet data found.

## 2021-12-06 NOTE — Evaluation (Signed)
Physical Therapy Evaluation Patient Details Name: Laura Santiago MRN: [id number] DOB: 1946/10/05 Today's Date: 12/06/2021  History of Present Illness  Patient is a 75 year old female with medial history significant for DVT, CKD, chronic venous stasis, essential hypertension, metastatic breast cancer, chronic leg wound who presents with right knee pain. Patient found to have acute knee effusion with underlying advanced degenerative joint disease s/p aspiration/injection of the right knee at the bedside   Clinical Impression  Patient is agreeable to PT. She lives alone at [facility] and has wound care for chronic wound on LLE at baseline. She reports her family is unable to assist her at discharge. She is usually independent with ADLs and uses a four wheeled walker for ambulation at home. She reports walking a mile a day prior to admission.   Today, patient is very talkative and needs cues for attention to task. The patient was able to ambulate as short distance with rolling walker in room with impaired gait pattern (right knee flexed). Unable to fully extend right knee with ROM/exercises or with ambulation. She is unable to ambulate a household distance currently due to right knee pain increasing to 6/10 with activity. She expressed concerns about not wanting to go to SNF but also not having adequate assistance to return home alone. I do not feel that she can safely return home alone at this time without assistance from family. Consider SNF placement. PT will continue to follow to maximize independence and facilitate return to prior level of function.       Recommendations for follow up therapy are one component of a multi-disciplinary discharge planning process, led by the attending physician.  Recommendations may be updated based on patient status, additional functional criteria and insurance authorization.  Follow Up Recommendations Skilled nursing-short term rehab (<3 hours/day)     Assistance Recommended at Discharge Frequent or constant Supervision/Assistance  Functional Status Assessment Patient has had a recent decline in their functional status and demonstrates the ability to make significant improvements in function in a reasonable and predictable amount of time.  Equipment Recommendations  None recommended by PT    Recommendations for Other Services       Precautions / Restrictions Precautions Precautions: Fall Restrictions Weight Bearing Restrictions: Yes RLE Weight Bearing: Weight bearing as tolerated      Mobility  Bed Mobility Overal bed mobility: Modified Independent             General bed mobility comments: increased time required, no physical assistance needed    Transfers Overall transfer level: Needs assistance Equipment used: Rolling walker (2 wheels) Transfers: Sit to/from Stand Sit to Stand: Min guard           General transfer comment: Min guard as patient is impulsive at times with activity    Ambulation/Gait Ambulation/Gait assistance: Min guard Gait Distance (Feet): 15 Feet Assistive device: Rolling walker (2 wheels) Gait Pattern/deviations: Step-to pattern;Knee flexed in stance - right;Decreased stride length;Trunk flexed (no heel strike RLE) Gait velocity: decreased   Pre-gait activities: emphasis on weight shifting for weight bearing and right knee extension prior to ambulation efforts General Gait Details: Min guard for safety with ambulation. patient only able to ambulate a short distance before RLE knee pain increasing to a 6/10. further ambulation deferred by the patient. she is currently unable to ambulate a household distance due to pain  Stairs            Wheelchair Mobility    Modified [name] (Stroke Patients  Only)       Balance Overall balance assessment: Needs assistance Sitting-balance support: Feet supported Sitting balance-Leahy Scale: Good     Standing balance support: Bilateral  upper extremity supported;Reliant on assistive device for balance Standing balance-Leahy Scale: Fair                               Pertinent Vitals/Pain Pain Assessment: 0-10 Pain Score: 6  Pain Location: right knee Pain Descriptors / Indicators: Discomfort Pain Intervention(s): Limited activity within patient's tolerance    Home Living Family/patient expects to be discharged to:: Private residence [facility] independent living) Living Arrangements: Alone Available Help at Discharge: Family;Available PRN/intermittently (occasional assistance from daughter per patient report) Type of Home: Apartment Home Access: Level entry       Home Layout: One level Home Equipment: Rollator (4 wheels) Additional Comments: patient has wound care for LLE, otherwise no services currently    Prior Function Prior Level of Function : Independent/Modified Independent             Mobility Comments: patient reports she walks a mile daily with her four wheeled walker. independent with mobility ADLs Comments: independent     Hand Dominance        Extremity/Trunk Assessment   Upper Extremity Assessment Upper Extremity Assessment: Overall WFL for tasks assessed    Lower Extremity Assessment Lower Extremity Assessment: RLE deficits/detail;LLE deficits/detail RLE Deficits / Details: knee ROM approximately 15-85 degrees. patient able to SLR through partial ROM with flexed knee. unable to straighten knee fully with standing/weight bearing but no knee buckling noted LLE Deficits / Details: patient has bandaging from knee distally down to mid foot area (chronic wound). patient able to SLR and weight bear without difficulty.       Communication   Communication: No difficulties  Cognition Arousal/Alertness: Awake/alert Behavior During Therapy: WFL for tasks assessed/performed;Anxious Overall Cognitive Status: Within Functional Limits for tasks assessed                                  General Comments: patient is very talkative. she needs frequent redirection and cues for attention to task. frequent tangential speech. patient expressed concerns about discharge planning with her and her family having different view points about discharge        General Comments      Exercises General Exercises - Lower Extremity Quad Sets: AROM;Strengthening;Right;5 reps;Supine Straight Leg Raises: AROM;Strengthening;Right;5 reps;Supine Other Exercises Other Exercises: educated patient on ROM, positioning of RLE in bed. encourgaed patient to be out of bed to chair and ambulate to the bathroom with staff assistance as appropriate   Assessment/Plan    PT Assessment Patient needs continued PT services  PT Problem List Decreased strength;Decreased range of motion;Decreased activity tolerance;Decreased balance;Decreased mobility;Decreased knowledge of use of DME;Pain;Decreased safety awareness       PT Treatment Interventions DME instruction;Gait training;Stair training;Functional mobility training;Therapeutic activities;Therapeutic exercise;Neuromuscular re-education;Patient/family education;Balance training    PT Goals (Current goals can be found in the Care Plan section)  Acute Rehab PT Goals Patient Stated Goal: to regain independence PT Goal Formulation: With patient Time For Goal Achievement: 12/20/21 Potential to Achieve Goals: Fair    Frequency 7X/week   Barriers to discharge Decreased caregiver support      Co-evaluation               AM-PAC PT "6 Clicks"  Mobility  Outcome Measure Help needed turning from your back to your side while in a flat bed without using bedrails?: None Help needed moving from lying on your back to sitting on the side of a flat bed without using bedrails?: A Little Help needed moving to and from a bed to a chair (including a wheelchair)?: A Little Help needed standing up from a chair using your arms (e.g., wheelchair or  bedside chair)?: A Little Help needed to walk in hospital room?: A Little Help needed climbing 3-5 steps with a railing? : A Lot 6 Click Score: 18    End of Session Equipment Utilized During Treatment: Gait belt Activity Tolerance: Patient tolerated treatment well Patient left: in bed;with call bell/phone within reach;with bed alarm set (ice pack applied to right knee at end of session) Nurse Communication: Mobility status PT Visit Diagnosis: Difficulty in walking, not elsewhere classified (R26.2);Other abnormalities of gait and mobility (R26.89);Pain    Time: 0912-1022 PT Time Calculation (min) (ACUTE ONLY): 70 min   Charges:   PT Evaluation $PT Eval Low Complexity: 1 Low PT Treatments $Gait Training: 8-22 mins $Therapeutic Exercise: 8-22 mins $Therapeutic Activity: 8-22 mins       Minna Merritts, PT, MPT   Percell Locus 12/06/2021, 12:55 PM

## 2021-12-06 NOTE — Progress Notes (Signed)
PROGRESS NOTE    Laura Santiago  NWG:[id number] DOB: Oct 16, 1946 DOA: 12/02/2021 PCP: Orvis Brill, [facility]    Chief Complaint  Patient presents with   Knee Pain   Neck Pain    Brief Narrative:   Laura Santiago is a 75 y.o. female with medical history significant for DVT on Xarelto, stage IIIb CKD, chronic venous stasis, essential hypertension, metastatic breast cancer (ER weakly +10%, PR negative and HER2 positive) and chronic leg wound who presents to the emergency department due to right knee pain. In the emergency department, she was tachypneic, but hemodynamically stable.  Work-up in the ED showed mild leukocytosis, normocytic anemia, hyperglycemia, BUN/creatinine 18/1.33 (creatinine is within baseline range).  Lactic acid 2.1 > 0.9, synovial fluid had a turbid appearance, WBC was 60,425.  Influenza A, B, SARS coronavirus 2 was negative.  CT cervical spine without contrast showed no acute fracture or traumatic malalignment of the cervical spine. CT head without contrast showed no acute intracranial pathology Chest x-ray showed no active cardiopulmonary disease  Right knee X RAYS  showed Tricompartmental severe degenerative changes of the right knee with associated joint effusion. Patient was started on IV cefepime, Flagyl and vancomycin.  Orthopedics consulted for possible septic arthritis, suggested it looks more like pseudogout presentation rather than septic arthritis.  Patient underwent aspiration and injection.  Patient tolerated procedure well and reports the knee pain is getting better. Therapy evaluations recommending SNF.   Assessment & Plan:   Principal Problem:   Infection of right knee (HCC) Active Problems:   Metastatic breast cancer ([place])   Cellulitis of right lower extremity   Lactic acidosis   Essential hypertension   SIRS (systemic inflammatory response syndrome) (HCC)   Sepsis probably secondary to a combination of mild right leg cellulitis vs  from right septic arthritis.  Arthrocentesis revealed yellow turbid liquid with 68,425 WBCs, Intracellular calcium pyrophosphate crystals.  Fluid sent for cultures and pending, negative so far MRI of the right knee shows Advanced tricompartmental osteoarthritis, most severe within the lateral compartment. Complex tearing/maceration of the visualized portions of the lateral meniscus. Vertical tear involving the peripheral aspect of the medial meniscal posterior horn. Large joint effusion with a few intra-articular loose bodies. Internal heterogeneity within the joint suggests synovitis. Findings may be reactive secondary to underlying arthropathy. Septic arthritis is not entirely excluded.  Orthopedics consulted for further evaluation, suggested possibly pseudogout presentation.  Patient underwent bedside aspiration and injection.  Colchicine was also added. Follow cultures and cultures of the synovial fluid.  Pt reports right knee pain has improved. Cellulitis of the lower extremity on the right resolved.  Transition IV antibiotics to oral Augmentin to complete the course Therapy evaluations recommending SNF.     Metastatic  right breast cancer On chemotherapy. Continue with oxycodone and fentanyl patch. Follows up with Dr. Janese Banks as outpatient    Stage 3 b CKD Creatinine better than baseline.    Essential hypertension Blood pressure parameters are optimal    Mild right lower extremity cellulitis Appears to have resolved.    Anemia of chronic disease:  Baseline hemoglobin around 9, dropped to 8.3 . Recheck CBC in the morning Continue to monitor.     History of chronic left lower extremity venous stasis ulcer Wound care consulted for recommendations.   History of DVT Pt reports she had another vascular study , was negative for DVT and she was asked by her doc to stop the xarelto.    DVT prophylaxis:Lovenox.  Code Status:  full code.  Family Communication:none at bedside.   Disposition:   Status is: Inpatient  Remains inpatient appropriate because: Unsafe DC plan      Consultants:  Orthopedics.   Procedures: arthrocentesis by EDP.   Antimicrobials:  Antibiotics Given (last 72 hours)     Date/Time Action Medication Dose Rate   12/03/21 2149 New Bag/Given   ceFEPIme (MAXIPIME) 2 g in sodium chloride 0.9 % 100 mL IVPB 2 g 200 mL/hr   12/03/21 2300 New Bag/Given   vancomycin (VANCOREADY) IVPB 750 mg/150 mL 750 mg 150 mL/hr   12/04/21 0851 New Bag/Given   ceFEPIme (MAXIPIME) 2 g in sodium chloride 0.9 % 100 mL IVPB 2 g 200 mL/hr   12/04/21 1756 New Bag/Given   vancomycin (VANCOREADY) IVPB 750 mg/150 mL 750 mg 150 mL/hr   12/04/21 2108 New Bag/Given   ceFEPIme (MAXIPIME) 2 g in sodium chloride 0.9 % 100 mL IVPB 2 g 200 mL/hr   12/05/21 0951 New Bag/Given   ceFEPIme (MAXIPIME) 2 g in sodium chloride 0.9 % 100 mL IVPB 2 g 200 mL/hr   12/05/21 1602 New Bag/Given   vancomycin (VANCOREADY) IVPB 750 mg/150 mL 750 mg 150 mL/hr   12/05/21 2229 New Bag/Given   ceFEPIme (MAXIPIME) 2 g in sodium chloride 0.9 % 100 mL IVPB 2 g 200 mL/hr   12/06/21 1024 New Bag/Given   ceFEPIme (MAXIPIME) 2 g in sodium chloride 0.9 % 100 mL IVPB 2 g 200 mL/hr         Subjective:  knee pain is improving  Objective: Vitals:   12/05/21 1721 12/05/21 1911 12/06/21 0417 12/06/21 0826  BP: (!) 158/91 (!) 157/80 (!) 156/83 (!) 140/91  Pulse: 89 83 87 73  Resp: _0 Temp: 99.2 F (37.3 C) 98.7 F (37.1 C) 98.9 F (37.2 C) 98.6 F (37 C)  TempSrc: Oral Oral Oral Oral  SpO2: 97% 98% 98% 100%  Weight:      Height:        Intake/Output Summary (Last 24 hours) at 12/06/2021 1431 Last data filed at 12/06/2021 1024 Gross per 24 hour  Intake 360 ml  Output 900 ml  Net -540 ml    Filed Weights   12/02/21 1011  Weight: 69.4 kg    Examination:  General exam: Appears calm and comfortable  Respiratory system: Clear to auscultation. Respiratory effort  normal. Cardiovascular system: S1 & S2 heard, RRR. No JVD,  No pedal edema. Gastrointestinal system: Abdomen is nondistended, soft and non tender. Normal bowel sounds heard. Central nervous system: Alert and oriented. No focal neurological deficits. Extremities: Right knee pain and tenderness improving Skin: Left lower extremity ulcer bandaged Psychiatry: Mood & affect appropriate.       Data Reviewed: I have personally reviewed following labs and imaging studies  CBC: Recent Labs  Lab 12/02/21 1018 12/03/21 0717 12/05/21 0924  WBC 10.7* 8.5 5.3  NEUTROABS 9.5*  --   --   HGB 10.3* 9.2* 8.3*  HCT 33.7* 29.3* 26.4*  MCV 90.6 89.3 88.6  PLT 272 239 236     Basic Metabolic Panel: Recent Labs  Lab 12/02/21 1018 12/03/21 0717 12/04/21 0611 12/05/21 0924  NA 136 135  --  139  K 4.0 4.0  --  3.6  CL 104 106  --  112*  CO2 24 22  --  23  GLUCOSE 125* 127*  --  114*  BUN 18 19  --  14  CREATININE 1.33* 1.19* 1.07*  1.02*  CALCIUM 8.2* 7.7*  --  7.8*  MG  --  1.7  --   --   PHOS  --  2.7  --   --      GFR: Estimated Creatinine Clearance: 42.1 mL/min (A) (by C-G formula based on SCr of 1.02 mg/dL (H)).  Liver Function Tests: Recent Labs  Lab 12/02/21 1018 12/03/21 0717  AST 22 15  ALT 11 10  ALKPHOS 71 59  BILITOT 0.9 0.9  PROT 7.5 6.2*  ALBUMIN 3.7 2.8*     CBG: No results for input(s): GLUCAP in the last 168 hours.   Recent Results (from the past 240 hour(s))  Resp Panel by RT-PCR (Flu A&B, Covid) Nasopharyngeal Swab     Status: None   Collection Time: 12/02/21  3:20 PM   Specimen: Nasopharyngeal Swab; Nasopharyngeal(NP) swabs in vial transport medium  Result Value Ref Range Status   SARS Coronavirus 2 by RT PCR NEGATIVE NEGATIVE Final    Comment: (NOTE) SARS-CoV-2 target nucleic acids are NOT DETECTED.  The SARS-CoV-2 RNA is generally detectable in upper respiratory specimens during the acute phase of infection. The lowest concentration of  SARS-CoV-2 viral copies this assay can detect is 138 copies/mL. A negative result does not preclude SARS-Cov-2 infection and should not be used as the sole basis for treatment or other patient management decisions. A negative result may occur with  improper specimen collection/handling, submission of specimen other than nasopharyngeal swab, presence of viral mutation(s) within the areas targeted by this assay, and inadequate number of viral copies(<138 copies/mL). A negative result must be combined with clinical observations, patient history, and epidemiological information. The expected result is Negative.  Fact Sheet for Patients:  EntrepreneurPulse.com.au  Fact Sheet for Healthcare Providers:  IncredibleEmployment.be  This test is no t yet approved or cleared by the [place] FDA and  has been authorized for detection and/or diagnosis of SARS-CoV-2 by FDA under an Emergency Use Authorization (EUA). This EUA will remain  in effect (meaning this test can be used) for the duration of the COVID-19 declaration under Section 564(b)(1) of the Act, 21 U.S.C.section 360bbb-3(b)(1), unless the authorization is terminated  or revoked sooner.       Influenza A by PCR NEGATIVE NEGATIVE Final   Influenza B by PCR NEGATIVE NEGATIVE Final    Comment: (NOTE) The Xpert Xpress SARS-CoV-2/FLU/RSV plus assay is intended as an aid in the diagnosis of influenza from Nasopharyngeal swab specimens and should not be used as a sole basis for treatment. Nasal washings and aspirates are unacceptable for Xpert Xpress SARS-CoV-2/FLU/RSV testing.  Fact Sheet for Patients: EntrepreneurPulse.com.au  Fact Sheet for Healthcare Providers: IncredibleEmployment.be  This test is not yet approved or cleared by the [place] FDA and has been authorized for detection and/or diagnosis of SARS-CoV-2 by FDA under an Emergency Use  Authorization (EUA). This EUA will remain in effect (meaning this test can be used) for the duration of the COVID-19 declaration under Section 564(b)(1) of the Act, 21 U.S.C. section 360bbb-3(b)(1), unless the authorization is terminated or revoked.  Performed at [facility], [address] [place], [place] [address]   Culture, blood (routine x 2)     Status: None (Preliminary result)   Collection Time: 12/02/21  3:20 PM   Specimen: BLOOD  Result Value Ref Range Status   Specimen Description BLOOD PORT  Final   Special Requests   Final    BOTTLES DRAWN AEROBIC AND ANAEROBIC Blood Culture results may not  be optimal due to an excessive volume of blood received in culture bottles   Culture   Final    NO GROWTH 4 DAYS Performed at [facility], [address] [place], [place] [address]    Report Status PENDING  Incomplete  Culture, blood (routine x 2)     Status: None (Preliminary result)   Collection Time: 12/02/21  3:20 PM   Specimen: BLOOD  Result Value Ref Range Status   Specimen Description BLOOD LEFT ARM  Final   Special Requests   Final    BOTTLES DRAWN AEROBIC AND ANAEROBIC Blood Culture adequate volume   Culture   Final    NO GROWTH 4 DAYS Performed at [facility], [address], [place], [place] [address]    Report Status PENDING  Incomplete  Body fluid culture w Gram Stain     Status: None   Collection Time: 12/02/21  3:20 PM   Specimen: KNEE; Body Fluid  Result Value Ref Range Status   Specimen Description   Final    KNEE RIGHT Performed at [facility], [address], [place], [place] [address]    Special Requests   Final    NONE Performed at [facility], [address] [place], [place] [address]    Gram Stain   Final    ABUNDANT WBC PRESENT, PREDOMINANTLY PMN NO ORGANISMS SEEN    Culture   Final    NO GROWTH 3 DAYS Performed at [facility], [place] [address], [place], [place] [address]     Report Status 12/06/2021 FINAL  Final          Radiology Studies: No results found.      Scheduled Meds:  amoxicillin-clavulanate  1 tablet Oral Q12H   bupivacaine  10 mL Intra-articular Once   Chlorhexidine Gluconate Cloth  6 each Topical Daily   colchicine  0.6 mg Oral BID   enoxaparin (LOVENOX) injection  40 mg Subcutaneous Q24H   fentaNYL  1 patch Transdermal Q72H   lisinopril  20 mg Oral Daily   triamcinolone acetonide  80 mg Intra-articular Once   Continuous Infusions:     LOS: 4 days        Hosie Poisson, MD [facility]   To contact the attending provider between 7A-7P or the covering provider during after hours 7P-7A, please log into the web site [email or web address] and access using universal [place] password for that web site. If you do not have the password, please call the hospital operator.  12/06/2021, 2:31 PM

## 2021-12-06 NOTE — NC FL2 (Signed)
[place] LEVEL OF CARE SCREENING TOOL     IDENTIFICATION  Patient Name: Laura Santiago Birthdate: March 11, 1946 Sex: female Admission Date (Current Location): 12/02/2021  [facility] and [place] Number:  Engineering geologist and Address:         Provider Number: [id number]  Attending Physician Name and Address:  Hosie Poisson, MD  Relative Name and Phone Number:       Current Level of Care: Hospital Recommended Level of Care: [place] Prior Approval Number:    Date Approved/Denied:   PASRR Number: [id number]  Discharge Plan: SNF    Current Diagnoses: Patient Active Problem List   Diagnosis Date Noted   Infection of right knee ([place]) 12/02/2021   SIRS (systemic inflammatory response syndrome) ([place]) 12/02/2021   Venous stasis ulcer with fat layer exposed ([place]) 08/29/2021   Tinea pedis of both feet 08/29/2021   Cellulitis of right lower extremity 08/29/2021   Metastatic breast cancer ([place]) 05/26/2021   Back pain 05/17/2021   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   Malignant neoplasm metastatic to bone ([place]) 04/27/2021   Primary insomnia 03/28/2021   Unsteady gait 03/28/2021   Lumbar compression fracture, open, initial encounter ([place]) 03/18/2021   Posterior vitreous detachment of both eyes 02/08/2021   Anemia in stage 4 chronic kidney disease ([place]) 09/03/2020   AKI (acute kidney injury) ([place]) 06/26/2020   Bacteriuria 11/26/2019   Iron deficiency anemia 11/26/2019   Venous stasis ulcer of left calf limited to breakdown of skin with varicose veins ([place]) 10/22/2018   Myopia of both eyes with astigmatism and presbyopia 05/27/2018   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 05/27/2018   Hyperparathyroidism, secondary ([place]) 04/25/2018   Lactic acidosis 01/27/2018   Generalized muscle weakness 03/31/2016   Attention deficit hyperactivity disorder (ADHD), predominantly inattentive type  03/29/2016   Osteopenia 03/29/2016   Essential hypertension 54/62/7035   Metabolic syndrome 00/93/8182   Chronic anticoagulation 11/16/2015   Right leg swelling 11/16/2015   Memory loss 02/05/2015   Atrophy of muscle of multiple sites 09/30/2014   Physical deconditioning 09/30/2014   Primary osteoarthritis of right knee 09/30/2014   History of breast cancer in female 09/14/2014   History of radiation therapy 09/14/2014   Acute embolism and thrombosis of unspecified deep veins of lower extremity, bilateral ([place]) 03/30/2014   Lymphadenopathy, inguinal 08/28/2013   Venous ulcer of ankle, left ([place]) 08/04/2013   GERD (gastroesophageal reflux disease) 06/02/2013    Orientation RESPIRATION BLADDER Height & Weight     Self, Time, Situation, Place    Incontinent Weight: 69.4 kg Height:  5\' 2"  (157.5 cm)  BEHAVIORAL SYMPTOMS/MOOD NEUROLOGICAL BOWEL NUTRITION STATUS      Continent Diet (Regular)  AMBULATORY STATUS COMMUNICATION OF NEEDS Skin   Limited Assist Verbally Other (Comment) (Left calf with chronic full thickness wound;Apply barrier cream around wound edges to protect skin, then apply Sorbact dressing soaked with Dakins,cover with alginate dressing.  Then apply Profore 4 layer compression wraps and "stocknette" over the top.)                       Personal Care Assistance Level of Assistance              Functional Limitations Info             SPECIAL CARE FACTORS FREQUENCY  PT (By licensed PT), OT (By licensed OT)  Contractures Contractures Info: Not present    Additional Factors Info  Code Status, Allergies Code Status Info: DNR Allergies Info: Corticosteroids, Sulfa Antibiotics, Celebrex (Celecoxib)           Current Medications (12/06/2021):  This is the current hospital active medication list Current Facility-Administered Medications  Medication Dose Route Frequency Provider Last Rate Last Admin   acetaminophen (TYLENOL)  tablet 650 mg  650 mg Oral Q6H PRN Adefeso, Oladapo, DO   650 mg at 12/04/21 2671   Or   acetaminophen (TYLENOL) suppository 650 mg  650 mg Rectal Q6H PRN Adefeso, Oladapo, DO       amoxicillin-clavulanate (AUGMENTIN) 875-125 MG per tablet 1 tablet  1 tablet Oral Q12H Hosie Poisson, MD       bupivacaine (MARCAINE) 0.5 % injection 10 mL  10 mL Intra-articular Once Poggi, Marshall Cork, MD       Chlorhexidine Gluconate Cloth 2 % PADS 6 each  6 each Topical Daily Hosie Poisson, MD   6 each at 12/06/21 1100   colchicine tablet 0.6 mg  0.6 mg Oral BID Hosie Poisson, MD   0.6 mg at 12/06/21 1025   enoxaparin (LOVENOX) injection 40 mg  40 mg Subcutaneous Q24H Hosie Poisson, MD   40 mg at 12/06/21 1026   fentaNYL (DURAGESIC) 12 MCG/HR 1 patch  1 patch Transdermal Q72H Hosie Poisson, MD   1 patch at 12/04/21 2217   hydrOXYzine (ATARAX) tablet 10 mg  10 mg Oral TID PRN Hosie Poisson, MD   10 mg at 12/06/21 0751   lisinopril (ZESTRIL) tablet 20 mg  20 mg Oral Daily Adefeso, Oladapo, DO   20 mg at 12/06/21 1025   oxyCODONE (Oxy IR/ROXICODONE) immediate release tablet 5-10 mg  5-10 mg Oral Q4H PRN Hosie Poisson, MD   10 mg at 12/06/21 1547   triamcinolone acetonide (KENALOG-40) injection 80 mg  80 mg Intra-articular Once Poggi, Marshall Cork, MD       Facility-Administered Medications Ordered in Other Encounters  Medication Dose Route Frequency Provider Last Rate Last Admin   heparin lock flush 100 UNIT/ML injection            sodium chloride flush (NS) 0.9 % injection 10 mL  10 mL Intracatheter PRN [name], MD         Discharge Medications: Please see discharge summary for a list of discharge medications.  Relevant Imaging Results:  Relevant Lab Results:   Additional Information ss [id number]  Beverly Sessions, RN

## 2021-12-07 ENCOUNTER — Encounter: Payer: Medicare Other | Admitting: Internal Medicine

## 2021-12-07 LAB — BASIC METABOLIC PANEL
Anion gap: 6 (ref 5–15)
BUN: 25 mg/dL — ABNORMAL HIGH (ref 8–23)
CO2: 24 mmol/L (ref 22–32)
Calcium: 8.7 mg/dL — ABNORMAL LOW (ref 8.9–10.3)
Chloride: 109 mmol/L (ref 98–111)
Creatinine, Ser: 1 mg/dL (ref 0.44–1.00)
GFR, Estimated: 58 mL/min — ABNORMAL LOW (ref >60)
Glucose, Bld: 129 mg/dL — ABNORMAL HIGH (ref 70–99)
Potassium: 3.9 mmol/L (ref 3.5–5.1)
Sodium: 139 mmol/L (ref 135–145)

## 2021-12-07 LAB — CBC
HCT: 30.2 % — ABNORMAL LOW (ref 36.0–46.0)
Hemoglobin: 9.4 g/dL — ABNORMAL LOW (ref 12.0–15.0)
MCH: 27.2 pg (ref 26.0–34.0)
MCHC: 31.1 g/dL (ref 30.0–36.0)
MCV: 87.3 fL (ref 80.0–100.0)
Platelets: 309 10*3/uL (ref 150–400)
RBC: 3.46 MIL/uL — ABNORMAL LOW (ref 3.87–5.11)
RDW: 16.7 % — ABNORMAL HIGH (ref 11.5–15.5)
WBC: 9.2 10*3/uL (ref 4.0–10.5)
nRBC: 0 % (ref 0.0–0.2)

## 2021-12-07 LAB — CULTURE, BLOOD (ROUTINE X 2)
Culture: NO GROWTH
Culture: NO GROWTH
Special Requests: ADEQUATE

## 2021-12-07 NOTE — Progress Notes (Signed)
Patient ID: Laura Santiago, female   DOB: 10-Sep-1946, 75 y.o.   MRN: [id number]  Subjective: The patient notes that her right knee symptoms are somewhat improved this afternoon following the injection she received yesterday.  She received physical therapy today and was able to take some steps in the room, although still has difficulty extending her knee fully.  She has no new complaints pertaining to her right knee.   Objective: Vital signs in last 24 hours: Temp:  [97.6 F (36.4 C)-98.6 F (37 C)] 97.6 F (36.4 C) (12/28 0438) Pulse Rate:  [69-78] 69 (12/28 0438) Resp:  [16-19] 16 (12/28 0438) BP: (133-173)/(63-91) 158/75 (12/28 0438) SpO2:  [99 %-100 %] 100 % (12/28 0438)  Intake/Output from previous day: 12/27 0701 - 12/28 0700 In: 460 [P.O.:360; IV Piggyback:100] Out: 1150 [Urine:1150] Intake/Output this shift: No intake/output data recorded.  Recent Labs    12/05/21 0924 12/07/21 0506  HGB 8.3* 9.4*   Recent Labs    12/05/21 0924 12/07/21 0506  WBC 5.3 9.2  RBC 2.98* 3.46*  HCT 26.4* 30.2*  PLT 236 309   Recent Labs    12/05/21 0924 12/07/21 0506  NA 139 139  K 3.6 3.9  CL 112* 109  CO2 23 24  BUN 14 25*  CREATININE 1.02* 1.00  GLUCOSE 114* 129*  CALCIUM 7.8* 8.7*   No results for input(s): LABPT, INR in the last 72 hours.  Physical Exam: Orthopedic examination again is limited to the right knee and lower extremity.  There is a 1+ effusion of the knee, which is improved versus yesterday.  She has no tenderness to palpation over the anterior, medial, or lateral aspects of the knee.  No overlying erythema, ecchymosis, abrasions or other skin abnormalities are identified.  Actively, she still has difficulty performing a straight leg raise, but is able to actively range her knee from 30 to 75 degrees which is an improvement.  Passively, the knee can be extended to 20 to 25 degrees and flexed to near 90 degrees.  She is neurovascularly intact to the right lower  extremity and foot.  Assessment: Acute right knee effusion, improving symptomatically.  Plan: The final culture results of the knee aspirate performed last week shows no growth.  Given the symptomatic improvement in the patient's right knee symptoms, I feel that she can continue to be mobilized with physical therapy weightbearing as tolerated on the right lower extremity.  Therapy also to continue to work with her on range of motion and strength exercises to the right knee and lower extremity.  I do feel that she would benefit from rehab placement until she gets her strength back sufficiently to become independent again.  Thank you for asking me to participate in the care of this most interesting patient.  I will sign off at this time.  If you have further need of orthopedic input, please reconsult me.   Marshall Cork Mearl [place]wood 12/06/21, 5:04 PM

## 2021-12-07 NOTE — Progress Notes (Addendum)
Progress Note    Laura Santiago  YFV:[id number] DOB: September 20, 1946  DOA: 12/02/2021 PCP: Orvis Brill, [facility]      Brief Narrative:    Medical records reviewed and are as summarized below:  Laura Santiago is a 75 y.o. 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  She presented to the hospital because of right knee pain.      Assessment/Plan:   Principal Problem:   Cellulitis of right lower extremity Active Problems:   Metastatic breast cancer (HCC)   Lactic acidosis   Essential hypertension   SIRS (systemic inflammatory response syndrome) (HCC)   Body mass index is 27.98 kg/m.   Sepsis from right leg cellulitis: Continue Augmentin  Right knee pain, right knee pseudogout: S/p arthrocentesis and synovial fluid culture did not show any growth.  No evidence of septic arthritis of the right knee.  Synovial fluid showed calcium pyrophosphate crystals.  Analgesics for pain.  Orthopedic surgeon has signed off.  Metastatic right breast cancer on chemotherapy: Follow-up with Dr. 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 Yes; Fluid consistency: Thin  Diet effective now                      Consultants: Orthopedic surgeon  Procedures: Right knee arthrocentesis    Medications:    amoxicillin-clavulanate  1 tablet Oral Q12H   bupivacaine  10 mL Intra-articular Once   Chlorhexidine Gluconate Cloth  6 each Topical Daily   colchicine  0.6 mg Oral BID   enoxaparin (LOVENOX) injection  40 mg Subcutaneous Q24H   fentaNYL  1 patch Transdermal Q72H   lisinopril  20 mg Oral Daily    triamcinolone acetonide  80 mg Intra-articular Once   Continuous Infusions:   Anti-infectives (From admission, onward)    Start     Dose/Rate Route Frequency Ordered Stop   12/06/21 1515  amoxicillin-clavulanate (AUGMENTIN) 875-125 MG per tablet 1 tablet        1 tablet Oral Every 12 hours 12/06/21 1422 12/11/21 0959   12/03/21 1600  vancomycin (VANCOREADY) IVPB 750 mg/150 mL  Status:  Discontinued        750 mg 150 mL/hr over 60 Minutes Intravenous Every 24 hours 12/02/21 2029 12/06/21 1422   12/03/21 1000  ceFEPIme (MAXIPIME) 2 g in sodium chloride 0.9 % 100 mL IVPB  Status:  Discontinued        2 g 200 mL/hr over 30 Minutes Intravenous Every 12 hours 12/03/21 0923 12/06/21 1422   12/02/21 1500  vancomycin (VANCOREADY) IVPB 1500 mg/300 mL        1,500 mg 150 mL/hr over 120 Minutes Intravenous  Once 12/02/21 1440 12/02/21 1815   12/02/21 1445  ceFEPIme (MAXIPIME) 2 g in sodium chloride 0.9 % 100 mL IVPB        2 g 200 mL/hr over 30 Minutes Intravenous  Once 12/02/21 1435 12/02/21 1723   12/02/21 1445  metroNIDAZOLE (FLAGYL) IVPB 500 mg        500 mg 100 mL/hr over 60 Minutes Intravenous  Once 12/02/21 1435 12/02/21 1727  12/02/21 1445  vancomycin (VANCOCIN) IVPB 1000 mg/200 mL premix  Status:  Discontinued        1,000 mg 200 mL/hr over 60 Minutes Intravenous  Once 12/02/21 1435 12/02/21 1440              Family Communication/Anticipated D/C date and plan/Code Status   DVT prophylaxis: enoxaparin (LOVENOX) injection 40 mg Start: 12/04/21 1030 SCDs Start: 12/02/21 1728     Code Status: DNR  Family Communication: None Disposition Plan: Plan to discharge to SNF   Status is: Inpatient  Remains inpatient appropriate because: Awaiting placement to SNF           Subjective:   C/o severe right knee pain  Objective:    Vitals:   12/06/21 1552 12/06/21 1959 12/07/21 0438 12/07/21 1521  BP: (!) 173/85 133/63 (!) 158/75 (!) 160/81  Pulse: 78 78 69 84   Resp: _0 Temp: 98.2 F (36.8 C) 97.7 F (36.5 C) 97.6 F (36.4 C)   TempSrc: Oral Oral Oral   SpO2: 100% 99% 100% 99%  Weight:      Height:       No data found.   Intake/Output Summary (Last 24 hours) at 12/07/2021 1608 Last data filed at 12/07/2021 1432 Gross per 24 hour  Intake 840 ml  Output 1150 ml  Net -310 ml   Filed Weights   12/02/21 1011  Weight: 69.4 kg    Exam:  GEN: NAD SKIN: Warm and dry.  Chronic wound on the left cough EYES: EOMI ENT: MMM CV: RRR PULM: CTA B ABD: soft, ND, NT, +BS CNS: AAO x 3, non focal EXT: Mild right knee tenderness without erythema or swelling.        Data Reviewed:   I have personally reviewed following labs and imaging studies:  Labs: Labs show the following:   Basic Metabolic Panel: Recent Labs  Lab 12/02/21 1018 12/03/21 0717 12/04/21 0611 12/05/21 0924 12/07/21 0506  NA 136 135  --  139 139  K 4.0 4.0  --  3.6 3.9  CL 104 106  --  112* 109  CO2 24 22  --  23 24  GLUCOSE 125* 127*  --  114* 129*  BUN 18 19  --  14 25*  CREATININE 1.33* 1.19* 1.07* 1.02* 1.00  CALCIUM 8.2* 7.7*  --  7.8* 8.7*  MG  --  1.7  --   --   --   PHOS  --  2.7  --   --   --    GFR Estimated Creatinine Clearance: 43 mL/min (by C-G formula based on SCr of 1 mg/dL). Liver Function Tests: Recent Labs  Lab 12/02/21 1018 12/03/21 0717  AST 22 15  ALT 11 10  ALKPHOS 71 59  BILITOT 0.9 0.9  PROT 7.5 6.2*  ALBUMIN 3.7 2.8*   No results for input(s): LIPASE, AMYLASE in the last 168 hours. No results for input(s): AMMONIA in the last 168 hours. Coagulation profile No results for input(s): INR, PROTIME in the last 168 hours.  CBC: Recent Labs  Lab 12/02/21 1018 12/03/21 0717 12/05/21 0924 12/07/21 0506  WBC 10.7* 8.5 5.3 9.2  NEUTROABS 9.5*  --   --   --   HGB 10.3* 9.2* 8.3* 9.4*  HCT 33.7* 29.3* 26.4* 30.2*  MCV 90.6 89.3 88.6 87.3  PLT 272 239 236 309   Cardiac Enzymes: No results for input(s):  CKTOTAL, CKMB, CKMBINDEX, TROPONINI in the last 168  hours. BNP (last 3 results) No results for input(s): PROBNP in the last 8760 hours. CBG: No results for input(s): GLUCAP in the last 168 hours. D-Dimer: No results for input(s): DDIMER in the last 72 hours. Hgb A1c: No results for input(s): HGBA1C in the last 72 hours. Lipid Profile: No results for input(s): CHOL, HDL, LDLCALC, TRIG, CHOLHDL, LDLDIRECT in the last 72 hours. Thyroid function studies: No results for input(s): TSH, T4TOTAL, T3FREE, THYROIDAB in the last 72 hours.  Invalid input(s): FREET3 Anemia work up: No results for input(s): VITAMINB12, FOLATE, FERRITIN, TIBC, IRON, RETICCTPCT in the last 72 hours. Sepsis Labs: Recent Labs  Lab 12/02/21 1018 12/02/21 1023 12/02/21 1520 12/03/21 0717 12/05/21 0924 12/07/21 0506  WBC 10.7*  --   --  8.5 5.3 9.2  LATICACIDVEN  --  2.1* 0.9  --   --   --     Microbiology Recent Results (from the past 240 hour(s))  Resp Panel by RT-PCR (Flu A&B, Covid) Nasopharyngeal Swab     Status: None   Collection Time: 12/02/21  3:20 PM   Specimen: Nasopharyngeal Swab; Nasopharyngeal(NP) swabs in vial transport medium  Result Value Ref Range Status   SARS Coronavirus 2 by RT PCR NEGATIVE NEGATIVE Final    Comment: (NOTE) SARS-CoV-2 target nucleic acids are NOT DETECTED.  The SARS-CoV-2 RNA is generally detectable in upper respiratory specimens during the acute phase of infection. The lowest concentration of SARS-CoV-2 viral copies this assay can detect is 138 copies/mL. A negative result does not preclude SARS-Cov-2 infection and should not be used as the sole basis for treatment or other patient management decisions. A negative result may occur with  improper specimen collection/handling, submission of specimen other than nasopharyngeal swab, presence of viral mutation(s) within the areas targeted by this assay, and inadequate number of viral copies(<138 copies/mL). A negative  result must be combined with clinical observations, patient history, and epidemiological information. The expected result is Negative.  Fact Sheet for Patients:  BloggerCourse.com  Fact Sheet for Healthcare Providers:  SeriousBroker.it  This test is no t yet approved or cleared by the [place] FDA and  has been authorized for detection and/or diagnosis of SARS-CoV-2 by FDA under an Emergency Use Authorization (EUA). This EUA will remain  in effect (meaning this test can be used) for the duration of the COVID-19 declaration under Section 564(b)(1) of the Act, 21 U.S.C.section 360bbb-3(b)(1), unless the authorization is terminated  or revoked sooner.       Influenza A by PCR NEGATIVE NEGATIVE Final   Influenza B by PCR NEGATIVE NEGATIVE Final    Comment: (NOTE) The Xpert Xpress SARS-CoV-2/FLU/RSV plus assay is intended as an aid in the diagnosis of influenza from Nasopharyngeal swab specimens and should not be used as a sole basis for treatment. Nasal washings and aspirates are unacceptable for Xpert Xpress SARS-CoV-2/FLU/RSV testing.  Fact Sheet for Patients: BloggerCourse.com  Fact Sheet for Healthcare Providers: SeriousBroker.it  This test is not yet approved or cleared by the [place] FDA and has been authorized for detection and/or diagnosis of SARS-CoV-2 by FDA under an Emergency Use Authorization (EUA). This EUA will remain in effect (meaning this test can be used) for the duration of the COVID-19 declaration under Section 564(b)(1) of the Act, 21 U.S.C. section 360bbb-3(b)(1), unless the authorization is terminated or revoked.  Performed at [facility], [address] [place], [place] [address]   Culture, blood (routine x 2)     Status: None  Collection Time: 12/02/21  3:20 PM   Specimen: BLOOD  Result Value Ref Range Status   Specimen  Description BLOOD PORT  Final   Special Requests   Final    BOTTLES DRAWN AEROBIC AND ANAEROBIC Blood Culture results may not be optimal due to an excessive volume of blood received in culture bottles   Culture   Final    NO GROWTH 5 DAYS Performed at [facility], [address], [place], [place] [address]    Report Status 12/07/2021 FINAL  Final  Culture, blood (routine x 2)     Status: None   Collection Time: 12/02/21  3:20 PM   Specimen: BLOOD  Result Value Ref Range Status   Specimen Description BLOOD LEFT ARM  Final   Special Requests   Final    BOTTLES DRAWN AEROBIC AND ANAEROBIC Blood Culture adequate volume   Culture   Final    NO GROWTH 5 DAYS Performed at [facility], [address] [place], [place] [address]    Report Status 12/07/2021 FINAL  Final  Body fluid culture w Gram Stain     Status: None   Collection Time: 12/02/21  3:20 PM   Specimen: KNEE; Body Fluid  Result Value Ref Range Status   Specimen Description   Final    KNEE RIGHT Performed at [facility], [address], [place], [place] [address]    Special Requests   Final    NONE Performed at [facility], [address] [place], [place] [address]    Gram Stain   Final    ABUNDANT WBC PRESENT, PREDOMINANTLY PMN NO ORGANISMS SEEN    Culture   Final    NO GROWTH 3 DAYS Performed at [facility], [place] [address] [place], [place] [address]    Report Status 12/06/2021 FINAL  Final    Procedures and diagnostic studies:  No results found.             LOS: 5 days   Devine Klingel  [facility]   Pager on [email or web address]. If 7PM-7AM, please contact night-coverage at [email or web address]     12/07/2021, 4:08 PM

## 2021-12-07 NOTE — TOC Progression Note (Signed)
Transition of Care [facility]) - Progression Note    Patient Details  Name: Laura Santiago MRN: [id number] Date of Birth: Jun 27, 1946  [facility] CM/SW Contact  Laura Sessions, RN Phone Number: 12/07/2021, 12:16 PM  Clinical Narrative:     Per patient request bed offers presented to daughter Laura Santiago.  She accepts bed at [facility].  Accepted bed in [facility] and notified Tammy at [facility] that anticipated DC is tomorrow.  Per Tammy no repeat covid test require        Expected Discharge Plan and Services                                                 Social Determinants of Health (SDOH) Interventions    Readmission Risk Interventions No flowsheet data found.

## 2021-12-07 NOTE — Progress Notes (Signed)
Physical Therapy Treatment Patient Details Name: Laura Santiago MRN: [id number] DOB: 1946-01-31 Today's Date: 12/07/2021   History of Present Illness Patient is a 75 year old female with medial history significant for DVT, CKD, chronic venous stasis, essential hypertension, metastatic breast cancer, chronic leg wound who presents with right knee pain. Patient found to have acute knee effusion with underlying advanced degenerative joint disease s/p aspiration/injection of the right knee at the bedside    PT Comments    Pt was in the middle of transferring to [facility] with RN tech assisting. Pt is slightly agitated and remained distraught throughout remainder of session. She is A and O. However during session, poor safety awareness and overall insight of deficits come to light. She gets a little emotional throughout session.  Pt does not want to go to rehab at DC. Author tried to discuss DC disposition however, pt is distraught and not listening to any recommendations/ [facility] will leave recs as SNF until pt can safely return home independently.    Recommendations for follow up therapy are one component of a multi-disciplinary discharge planning process, led by the attending physician.  Recommendations may be updated based on patient status, additional functional criteria and insurance authorization.  Follow Up Recommendations  Skilled nursing-short term rehab (<3 hours/day) (24/7 supervision)     Assistance Recommended at Discharge Frequent or constant Supervision/Assistance  Equipment Recommendations  None recommended by PT       Precautions / Restrictions Precautions Precautions: Fall Restrictions Weight Bearing Restrictions: Yes RLE Weight Bearing: Weight bearing as tolerated     Mobility  Bed Mobility Overal bed mobility: Modified Independent  Transfers Overall transfer level: Needs assistance Equipment used: Rolling walker (2 wheels) Transfers: Sit to/from Stand Sit to Stand:  Min guard    General transfer comment: Pt was able to stand with CGA for safety due to impulsivity. Overall pt has poor insight of deficits and poor safety awareness.    Ambulation/Gait Ambulation/Gait assistance: Min guard Gait Distance (Feet): 50 Feet Assistive device: Rolling walker (2 wheels) Gait Pattern/deviations: Knee flexed in stance - right;Decreased stride length;Trunk flexed;Step-through pattern Gait velocity: decreased     General Gait Details: pt was able to ambulate 12ft with RW with poor posture and poor pain tolerance to knee    Balance Overall balance assessment: Needs assistance Sitting-balance support: Feet supported Sitting balance-Leahy Scale: Good     Standing balance support: Bilateral upper extremity supported;Reliant on assistive device for balance Standing balance-Leahy Scale: Fair     Cognition Arousal/Alertness: Awake/alert Behavior During Therapy: Anxious;Agitated Overall Cognitive Status: Within Functional Limits for tasks assessed      General Comments: Patient is very talkative. she needs frequent redirection and cues for attention to task. Frequent tangential speech. patient expressed concerns about discharge planning with her and her family having different view points about discharge               Pertinent Vitals/Pain Pain Assessment: 0-10 Pain Score: 3  Breathing: normal Negative Vocalization: none Facial Expression: smiling or inexpressive Body Language: relaxed Consolability: no need to console PAINAD Score: 0     PT Goals (current goals can now be found in the care plan section) Acute Rehab PT Goals Patient Stated Goal: to regain independence Progress towards PT goals: Progressing toward goals    Frequency    7X/week      PT Plan Current plan remains appropriate       AM-PAC PT "6 Clicks" Mobility   Outcome Measure  Help needed turning from your back to your side while in a flat bed without using bedrails?:  None Help needed moving from lying on your back to sitting on the side of a flat bed without using bedrails?: A Little Help needed moving to and from a bed to a chair (including a wheelchair)?: A Little Help needed standing up from a chair using your arms (e.g., wheelchair or bedside chair)?: A Little Help needed to walk in hospital room?: A Little Help needed climbing 3-5 steps with a railing? : A Lot 6 Click Score: 18    End of Session Equipment Utilized During Treatment: Gait belt Activity Tolerance: Patient tolerated treatment well Patient left: in chair;with call bell/phone within reach;with chair alarm set Nurse Communication: Mobility status PT Visit Diagnosis: Difficulty in walking, not elsewhere classified (R26.2);Other abnormalities of gait and mobility (R26.89);Pain     Time: 1055-1120 PT Time Calculation (min) (ACUTE ONLY): 25 min  Charges:  $Gait Training: 8-22 mins $Therapeutic Activity: 8-22 mins                     Julaine Fusi PTA 12/07/21, 1:27 PM

## 2021-12-07 NOTE — Consult Note (Signed)
[place] Nurse wound follow up Pt  is followed by the outpatient wound care center prior to admission and is very well-informed regarding topical treatment. We will continue the current plan of care which was ordered by the outpatient wound care center as outlined below.  Wound type: Left calf with chronic full thickness wound; unchanged since previous assessment on 12/26.  Pt was medicated for pain prior to the procedure and tolerated with minimal amt discomfort. 100% red and moist, mod amt tan drainage.  Pt should resume follow-up with outpatient wound care center after discharge.  Dressing procedure/placement/frequency: WOC will change left leg dressing Q M/W/F as follows: Cleanse wound with Dakins moistened gauze.  Apply barrier cream around wound edges to protect skin, then apply Sorbact dressing soaked with Dakins, cut to fit the size of the wound, and cover with alginate dressing ad ABD pad.  Then apply Profore 4 layer compression wraps. Julien Girt MSN, RN, [place], [place], [place]

## 2021-12-08 MED ORDER — FENTANYL 12 MCG/HR TD PT72: 1.0000 | MEDICATED_PATCH | TRANSDERMAL | 0 refills | Status: DC

## 2021-12-08 MED ORDER — OXYCODONE HCL 10 MG PO TABS: 10.0000 mg | ORAL_TABLET | ORAL | 0 refills | Status: DC | PRN

## 2021-12-08 MED ORDER — AMPHETAMINE-DEXTROAMPHET ER 30 MG PO CP24: 30.0000 mg | ORAL_CAPSULE | Freq: Every day | ORAL | 0 refills | Status: DC

## 2021-12-08 MED ORDER — COLCHICINE 0.6 MG PO TABS: 0.6000 mg | ORAL_TABLET | Freq: Two times a day (BID) | ORAL | Status: DC

## 2021-12-08 NOTE — Plan of Care (Signed)
  Problem: Education: Goal: Knowledge of General Education information will improve Description Including pain rating scale, medication(s)/side effects and non-pharmacologic comfort measures Outcome: Progressing   

## 2021-12-08 NOTE — TOC Transition Note (Signed)
Transition of Care [facility]) - CM/SW Discharge Note   Patient Details  Name: VIVAN VANDERVEER MRN: [id number] Date of Birth: 1946/07/07  Transition of Care [facility]) CM/SW Contact:  Beverly Sessions, RN Phone Number: 12/08/2021, 3:20 PM   Clinical Narrative:    Patient to discharge today Patient decided that she wanted to return to [facility].  Spoke with [facility] they are unable to accept patient back until after she completed rehab.   [facility]  [facility] are unable to accept admissions at this time.   Daughter and patient in agreement for [facility] at [facility] confirms that patient can come today.     Per MD patient ready for DC to . RN, patient, patient's family, and facility notified of DC. Discharge Summary sent to facility. RN given number for report. DC packet on chart. Ambulance transport requested for patient.  TOC signing off.  Isaias Cowman [facility] [phone]          Patient Goals and CMS Choice        Discharge Placement                       Discharge Plan and Services                                     Social Determinants of Health (SDOH) Interventions     Readmission Risk Interventions No flowsheet data found.

## 2021-12-08 NOTE — Care Management Important Message (Signed)
Important Message  Patient Details  Name: Laura Santiago MRN: [id number] Date of Birth: [birth year]   Medicare Important Message Given:  N/A - LOS <3 / Initial given by admissions  Patient Access reviewed Medicare IM with daughter, Aurelio Jew, at [phone] on 12/07/2021 at 11:33am.   Dannette Barbara 12/08/2021, 9:00 AM

## 2021-12-08 NOTE — Discharge Summary (Signed)
Physician Discharge Summary  Laura Santiago YVO:[id number] DOB: Mar 28, 1946 DOA: 12/02/2021  PCP: Orvis Brill, [facility]  Admit date: 12/02/2021 Discharge date: 12/08/2021  Discharge disposition: SNF   Recommendations for Outpatient Follow-Up:   Schedule an appointment to see Dr. Janese Banks for chemotherapy   Discharge Diagnosis:   Principal Problem:   Cellulitis of right lower extremity Active Problems:   Metastatic breast cancer ([place])   Lactic acidosis   Essential hypertension   SIRS (systemic inflammatory response syndrome) ([place])    Discharge Condition: Stable.  Diet recommendation:  Diet Order             Diet - low sodium heart healthy           Diet regular Room service appropriate? Yes; Fluid consistency: Thin  Diet effective now                     Code Status: DNR     Hospital Course:   Ms. Laura Santiago is a 75 y.o. 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  She presented to the hospital because of right knee pain.   She was found to have sepsis secondary to right leg cellulitis.  She was treated with empiric IV antibiotics.  Septic arthritis of the right knee was suspected.  Orthopedic surgeon was consulted and she underwent right knee arthrocentesis.  Synovial fluid culture did not show any growth but there was evidence of calcium pyrophosphate crystals.  She was started on colchicine for pseudogout.  She was evaluated by PT and OT recommended further rehabilitation at a skilled nursing facility.  Her condition has improved and she is deemed stable for discharge to SNF today.     Medical Consultants:   Orthopedic surgeon   Discharge Exam:    Vitals:   12/07/21 1521 12/07/21 2007 12/08/21 0256  12/08/21 0742  BP: (!) 160/81 (!) 141/82 (!) 141/77 (!) 143/85  Pulse: 84 72 70 71  Resp: _0 Temp:  99.2 F (37.3 C) 98.2 F (36.8 C) 98.1 F (36.7 C)  TempSrc:  Oral  Oral  SpO2: 99% 99% 98% 98%  Weight:      Height:         GEN: NAD SKIN: Warm and dry EYES: No pallor or icterus ENT: MMM CV: RRR PULM: CTA B ABD: soft, ND, NT, +BS CNS: AAO x 3, non focal EXT: Minimal right knee tenderness.  No erythema or swelling   The results of significant diagnostics from this hospitalization (including imaging, microbiology, ancillary and laboratory) are listed below for reference.     Procedures and Diagnostic Studies:   DG Chest 2 View  Result Date: 12/02/2021 CLINICAL DATA:  Sepsis EXAM: CHEST - 2 VIEW COMPARISON:  07/01/2021 FINDINGS: Left-sided Port-A-Cath in stable positioning. The heart size and mediastinal contours are within normal limits. Chronic elevation of the right hemidiaphragm. No focal airspace consolidation, pleural effusion, or pneumothorax. No appreciable interval change in appearance of sclerotic osseous metastatic disease. IMPRESSION: No active cardiopulmonary disease. Electronically Signed   By: Davina Poke D.O.   On: 12/02/2021 15:12   DG Knee 2 Views Right  Result Date: 12/02/2021 CLINICAL DATA:  Pain and swelling EXAM: RIGHT KNEE - 1-2 VIEW COMPARISON:  None. FINDINGS: No evidence  of fracture or dislocation. At least moderate volume joint effusion. Severe tricompartmental degenerative changes. No aggressive appearing focal bone abnormality. Soft tissues are unremarkable. IMPRESSION: 1. Tricompartmental severe degenerative changes of the right knee with associated joint effusion. 2.  No acute displaced fracture or dislocation. Electronically Signed   By: Iven Finn M.D.   On: 12/02/2021 15:09   CT Head Wo Contrast  Result Date: 12/02/2021 CLINICAL DATA:  Altered mental status, neck pain EXAM: CT HEAD WITHOUT CONTRAST TECHNIQUE: Contiguous  axial images were obtained from the base of the skull through the vertex without intravenous contrast. COMPARISON:  Brain MRI 11/28/2021 FINDINGS: Brain: There is no evidence of acute intracranial hemorrhage, extra-axial fluid collection, or acute infarct. Parenchymal volume is normal. The ventricles are normal in size. There is no mass lesion. There is no midline shift. Vascular: No hyperdense vessel or unexpected calcification. Skull: Normal. Negative for fracture or focal lesion. Sinuses/Orbits: The paranasal sinuses are clear. Bilateral lens implants are in place. The globes and orbits are otherwise unremarkable. Other: None. IMPRESSION: No acute intracranial pathology. Electronically Signed   By: Valetta Mole M.D.   On: 12/02/2021 15:17   CT Cervical Spine Wo Contrast  Result Date: 12/02/2021 CLINICAL DATA:  Neck pain, history of breast cancer with metastatic disease EXAM: CT CERVICAL SPINE WITHOUT CONTRAST TECHNIQUE: Multidetector CT imaging of the cervical spine was performed without intravenous contrast. Multiplanar CT image reconstructions were also generated. COMPARISON:  None. FINDINGS: Alignment: There is trace retrolisthesis of C3 on C4 and grade 1 anterolisthesis of C4 on C5. There is no jumped or perched facets or other evidence of traumatic malalignment. Skull base and vertebrae: Skull base alignment is maintained. Vertebral body heights are preserved. There is partial fusion of the C2 and C3 vertebral bodies. There is no evidence of acute fracture. There is extensive sclerosis throughout the cervical spine below the C3 level consistent with diffuse osseous metastatic disease. Soft tissues and spinal canal: No prevertebral fluid or swelling. No visible canal hematoma. Disc levels: There is mild multilevel degenerative endplate change and facet arthropathy throughout the cervical spine, most advanced at C4-C5 through C6-C7. The osseous spinal canal is patent. There is moderate to severe bilateral  neural foraminal stenosis at C4-C5 through C6-C7. Upper chest: The imaged lung apices are clear. Other: Left chest port tubing is partially imaged. IMPRESSION: 1. No acute fracture or traumatic malalignment of the cervical spine. 2. Extensive sclerosis throughout the cervical spine below the C3 level consistent with extensive osseous metastatic disease. 3. Grade 1 anterolisthesis of C4 on C5 retrolisthesis of C3 on C4 and grade 1 anterolisthesis of C4 on C5, likely degenerative in nature. 4. Multilevel degenerative changes throughout the cervical spine as above. Electronically Signed   By: Valetta Mole M.D.   On: 12/02/2021 15:21   MR KNEE RIGHT WO CONTRAST  Result Date: 12/03/2021 CLINICAL DATA:  Right knee pain and swelling. Septic arthritis suspected, knee, xray done EXAM: MRI OF THE RIGHT KNEE WITHOUT CONTRAST TECHNIQUE: Multiplanar, multisequence MR imaging of the knee was performed. No intravenous contrast was administered. COMPARISON:  X-ray 12/02/2021 FINDINGS: Technical Note: Despite efforts by the technologist and patient, motion artifact is present on today's exam and could not be eliminated. This reduces exam sensitivity and specificity. MENISCI Medial meniscus: Vertical tear involving the peripheral aspect of the medial meniscal posterior horn extending towards the body segment (series 18, images 26-29). Medial meniscus is diminutive and degenerated in appearance. Lateral meniscus: Complex tearing/maceration of the visualized  portions of the lateral meniscus. No significant meniscal tissue is seen in the region of the lateral meniscal body. LIGAMENTS Cruciates: ACL is markedly degenerated or torn.  PCL intact. Collaterals: Intact MCL. Proximal fibular collateral ligament is ill-defined, degenerated or torn. Distal attachment the IT band is also degenerated in appearance. Lateral collateral ligament complex otherwise intact. CARTILAGE Patellofemoral: High-grade chondral loss within the trochlear  groove. Medial: Partial-thickness chondral loss and irregularity of the weight-bearing medial compartment. Lateral: Extensive full-thickness cartilage loss of the lateral compartment with remodeling of the lateral tibial plateau. MISCELLANEOUS Joint: Large joint effusion with a few intra-articular loose bodies. Internal heterogeneity within the joint suggests synovitis. Edematous appearance of Hoffa's fat. Popliteal Fossa: Trace fluid within a Baker's cyst. Grossly intact popliteus tendon. Extensor Mechanism:  Intact quadriceps and patellar tendons. Bones: Tricompartmental joint space loss with bulky marginal osteophytes, most pronounced within the lateral compartment. Patchy subchondral bone marrow edema about the knee, most pronounced laterally, favored to be degenerative/reactive. No fracture. No suspicious marrow replacing bone lesion. Other: Multiple ganglia are seen along the posterior aspect of the knee and extending inferiorly from the proximal tibiofibular joint. Circumferential subcutaneous edema, nonspecific. IMPRESSION: 1. Advanced tricompartmental osteoarthritis, most severe within the lateral compartment. 2. Complex tearing/maceration of the visualized portions of the lateral meniscus. 3. Vertical tear involving the peripheral aspect of the medial meniscal posterior horn. 4. Large joint effusion with a few intra-articular loose bodies. Internal heterogeneity within the joint suggests synovitis. Findings may be reactive secondary to underlying arthropathy. Septic arthritis is not entirely excluded. Correlate with arthrocentesis results. Electronically Signed   By: Davina Poke D.O.   On: 12/03/2021 13:56     Labs:   Basic Metabolic Panel: Recent Labs  Lab 12/02/21 1018 12/03/21 0717 12/04/21 0611 12/05/21 0924 12/07/21 0506  NA 136 135  --  139 139  K 4.0 4.0  --  3.6 3.9  CL 104 106  --  112* 109  CO2 24 22  --  23 24  GLUCOSE 125* 127*  --  114* 129*  BUN 18 19  --  14 25*   CREATININE 1.33* 1.19* 1.07* 1.02* 1.00  CALCIUM 8.2* 7.7*  --  7.8* 8.7*  MG  --  1.7  --   --   --   PHOS  --  2.7  --   --   --    GFR Estimated Creatinine Clearance: 43 mL/min (by C-G formula based on SCr of 1 mg/dL). Liver Function Tests: Recent Labs  Lab 12/02/21 1018 12/03/21 0717  AST 22 15  ALT 11 10  ALKPHOS 71 59  BILITOT 0.9 0.9  PROT 7.5 6.2*  ALBUMIN 3.7 2.8*   No results for input(s): LIPASE, AMYLASE in the last 168 hours. No results for input(s): AMMONIA in the last 168 hours. Coagulation profile No results for input(s): INR, PROTIME in the last 168 hours.  CBC: Recent Labs  Lab 12/02/21 1018 12/03/21 0717 12/05/21 0924 12/07/21 0506  WBC 10.7* 8.5 5.3 9.2  NEUTROABS 9.5*  --   --   --   HGB 10.3* 9.2* 8.3* 9.4*  HCT 33.7* 29.3* 26.4* 30.2*  MCV 90.6 89.3 88.6 87.3  PLT 272 239 236 309   Cardiac Enzymes: No results for input(s): CKTOTAL, CKMB, CKMBINDEX, TROPONINI in the last 168 hours. BNP: Invalid input(s): POCBNP CBG: No results for input(s): GLUCAP in the last 168 hours. D-Dimer No results for input(s): DDIMER in the last 72 hours. Hgb A1c No results  for input(s): HGBA1C in the last 72 hours. Lipid Profile No results for input(s): CHOL, HDL, LDLCALC, TRIG, CHOLHDL, LDLDIRECT in the last 72 hours. Thyroid function studies No results for input(s): TSH, T4TOTAL, T3FREE, THYROIDAB in the last 72 hours.  Invalid input(s): FREET3 Anemia work up No results for input(s): VITAMINB12, FOLATE, FERRITIN, TIBC, IRON, RETICCTPCT in the last 72 hours. Microbiology Recent Results (from the past 240 hour(s))  Resp Panel by RT-PCR (Flu A&B, Covid) Nasopharyngeal Swab     Status: None   Collection Time: 12/02/21  3:20 PM   Specimen: Nasopharyngeal Swab; Nasopharyngeal(NP) swabs in vial transport medium  Result Value Ref Range Status   SARS Coronavirus 2 by RT PCR NEGATIVE NEGATIVE Final    Comment: (NOTE) SARS-CoV-2 target nucleic acids are NOT  DETECTED.  The SARS-CoV-2 RNA is generally detectable in upper respiratory specimens during the acute phase of infection. The lowest concentration of SARS-CoV-2 viral copies this assay can detect is 138 copies/mL. A negative result does not preclude SARS-Cov-2 infection and should not be used as the sole basis for treatment or other patient management decisions. A negative result may occur with  improper specimen collection/handling, submission of specimen other than nasopharyngeal swab, presence of viral mutation(s) within the areas targeted by this assay, and inadequate number of viral copies(<138 copies/mL). A negative result must be combined with clinical observations, patient history, and epidemiological information. The expected result is Negative.  Fact Sheet for Patients:  EntrepreneurPulse.com.au  Fact Sheet for Healthcare Providers:  IncredibleEmployment.be  This test is no t yet approved or cleared by the [place] FDA and  has been authorized for detection and/or diagnosis of SARS-CoV-2 by FDA under an Emergency Use Authorization (EUA). This EUA will remain  in effect (meaning this test can be used) for the duration of the COVID-19 declaration under Section 564(b)(1) of the Act, 21 U.S.C.section 360bbb-3(b)(1), unless the authorization is terminated  or revoked sooner.       Influenza A by PCR NEGATIVE NEGATIVE Final   Influenza B by PCR NEGATIVE NEGATIVE Final    Comment: (NOTE) The Xpert Xpress SARS-CoV-2/FLU/RSV plus assay is intended as an aid in the diagnosis of influenza from Nasopharyngeal swab specimens and should not be used as a sole basis for treatment. Nasal washings and aspirates are unacceptable for Xpert Xpress SARS-CoV-2/FLU/RSV testing.  Fact Sheet for Patients: EntrepreneurPulse.com.au  Fact Sheet for Healthcare Providers: IncredibleEmployment.be  This test is not yet  approved or cleared by the [place] FDA and has been authorized for detection and/or diagnosis of SARS-CoV-2 by FDA under an Emergency Use Authorization (EUA). This EUA will remain in effect (meaning this test can be used) for the duration of the COVID-19 declaration under Section 564(b)(1) of the Act, 21 U.S.C. section 360bbb-3(b)(1), unless the authorization is terminated or revoked.  Performed at [facility], [address] [place], [place] [address]   Culture, blood (routine x 2)     Status: None   Collection Time: 12/02/21  3:20 PM   Specimen: BLOOD  Result Value Ref Range Status   Specimen Description BLOOD PORT  Final   Special Requests   Final    BOTTLES DRAWN AEROBIC AND ANAEROBIC Blood Culture results may not be optimal due to an excessive volume of blood received in culture bottles   Culture   Final    NO GROWTH 5 DAYS Performed at [facility], [address] [place], [place] [address]    Report Status 12/07/2021 FINAL  Final  Culture, blood (routine x 2)     Status: None   Collection Time: 12/02/21  3:20 PM   Specimen: BLOOD  Result Value Ref Range Status   Specimen Description BLOOD LEFT ARM  Final   Special Requests   Final    BOTTLES DRAWN AEROBIC AND ANAEROBIC Blood Culture adequate volume   Culture   Final    NO GROWTH 5 DAYS Performed at [facility], [address] [place], [place] [address]    Report Status 12/07/2021 FINAL  Final  Body fluid culture w Gram Stain     Status: None   Collection Time: 12/02/21  3:20 PM   Specimen: KNEE; Body Fluid  Result Value Ref Range Status   Specimen Description   Final    KNEE RIGHT Performed at [facility], [address] [place], [place] [address]    Special Requests   Final    NONE Performed at [facility], [address] [place], [place] [address]    Gram Stain   Final    ABUNDANT WBC PRESENT, PREDOMINANTLY PMN NO ORGANISMS SEEN    Culture    Final    NO GROWTH 3 DAYS Performed at [facility], [place] [address] [place], [place] [address]    Report Status 12/06/2021 FINAL  Final     Discharge Instructions:   Discharge Instructions     Diet - low sodium heart healthy   Complete by: As directed    Discharge instructions   Complete by: As directed    Schedule an appointment to see Dr. Janese Banks, oncologist, for chemotherapy   Discharge wound care:   Complete by: As directed    [place] will change left leg dressing Q M/W/F as follows: Cleanse wound with Dakins moistened gauze.  Apply barrier cream around wound edges to protect skin, then apply Sorbact dressing soaked with Dakins, cut to fit the size of the wound, and cover with alginate dressing ad ABD pad.  Then apply Profore 4 layer compression wraps   Increase activity slowly   Complete by: As directed       Allergies as of 12/08/2021       Reactions   Corticosteroids Other (See Comments)   Pt trf from [name] and per primary md for her cancer tx. Notes that it causes agitation intolerance   Sulfa Antibiotics Other (See Comments)   Pt moved from [name] and in MD notes she has allergy but we do not know reactions when taking the drug   Celebrex [celecoxib] Rash        Medication List     STOP taking these medications    LORazepam 0.5 MG tablet Commonly known as: Ativan   potassium chloride SA 20 MEQ tablet Commonly known as: KLOR-CON M   rivaroxaban 20 MG Tabs tablet Commonly known as: XARELTO   zolpidem 5 MG tablet Commonly known as: AMBIEN       TAKE these medications    acetaminophen 500 MG tablet Commonly known as: TYLENOL Take 500-1,000 mg by mouth every 6 (six) hours as needed for mild pain or moderate pain.   amphetamine-dextroamphetamine 30 MG 24 hr capsule Commonly known as: Adderall XR Take 1 capsule (30 mg total) by mouth daily.   Calcium 200 MG Tabs Take 1 tablet by mouth daily.   colchicine 0.6 MG tablet Take 1 tablet (0.6 mg total)  by mouth 2 (two) times daily.   fentaNYL 12 MCG/HR Commonly known as:  [place] 1 patch onto the skin every 3 (three) days. 1 patch to skin. Next dose on 12/10/2021 at 10:40 pm Start taking on: December 10, 2021 What changed: additional instructions   lisinopril 20 MG tablet Commonly known as: ZESTRIL Take 20 mg by mouth daily.   Multivitamin Adults Tabs Take 1 tablet by mouth daily.   Oxycodone HCl 10 MG Tabs Take 1 tablet (10 mg total) by mouth every 4 (four) hours as needed.   sodium hypochlorite 0.125 % Soln Commonly known as: DAKIN'S 1/4 STRENGTH Apply 1 application topically as directed. Moisten gauze with solution and wrap wound   terbinafine 1 % cream Commonly known as: LamISIL AT Apply 1 application topically 2 (two) times daily. What changed:  when to take this reasons to take this               Discharge Care Instructions  (From admission, onward)           Start     Ordered   12/08/21 0000  Discharge wound care:       Comments: WOC will change left leg dressing Q M/W/F as follows: Cleanse wound with Dakins moistened gauze.  Apply barrier cream around wound edges to protect skin, then apply Sorbact dressing soaked with Dakins, cut to fit the size of the wound, and cover with alginate dressing ad ABD pad.  Then apply Profore 4 layer compression wraps   12/08/21 1219            Contact information for after-discharge care     Destination     [facility] Preferred SNF .   Service: Skilled Nursing Contact information: [address] [place] [phone]                       If you experience worsening of your admission symptoms, develop shortness of breath, life threatening emergency, suicidal or homicidal thoughts you must seek medical attention immediately by calling 911 or calling your MD immediately  if symptoms less severe.   You must read complete instructions/literature  along with all the possible adverse reactions/side effects for all the medicines you take and that have been prescribed to you. Take any new medicines after you have completely understood and accept all the possible adverse reactions/side effects.    Please note   You were cared for by a hospitalist during your hospital stay. If you have any questions about your discharge medications or the care you received while you were in the hospital after you are discharged, you can call the unit and asked to speak with the hospitalist on call if the hospitalist that took care of you is not available. Once you are discharged, your primary care physician will handle any further medical issues. Please note that NO REFILLS for any discharge medications will be authorized once you are discharged, as it is imperative that you return to your primary care physician (or establish a relationship with a primary care physician if you do not have one) for your aftercare needs so that they can reassess your need for medications and monitor your lab values.       Time coordinating discharge: 36 minutes  Signed:  Paiton Fosco  [facility] 12/08/2021, 12:20 PM   Pager on [email or web address]. If 7PM-7AM, please contact night-coverage at [email or web address]

## 2021-12-09 ENCOUNTER — Inpatient Hospital Stay: Payer: Medicare Other

## 2021-12-09 ENCOUNTER — Telehealth: Payer: Self-pay | Admitting: Oncology

## 2021-12-09 NOTE — Telephone Encounter (Signed)
ok 

## 2021-12-09 NOTE — Telephone Encounter (Signed)
Spoke with Magda Paganini and arranged for patient to return on 1/3. She was scheduled for Portlabs/Taxol/Zometa.   Dr. Ria Comment also missed her last f/u due to being hospitalized so I have added an encounter with you if that's ok.

## 2021-12-09 NOTE — Telephone Encounter (Signed)
Received call from [facility] this morning that they would not be able to transport the patient for her treatment today. She was just [facility] from the hospital yesterday. Appts have been canceled. They are requesting a return call to reschedule. Zenovia Jordan @ [phone]

## 2021-12-11 DIAGNOSIS — C7951 Secondary malignant neoplasm of bone: Secondary | ICD-10-CM | POA: Insufficient documentation

## 2021-12-12 ENCOUNTER — Encounter: Payer: Self-pay | Admitting: Oncology

## 2021-12-12 NOTE — Progress Notes (Signed)
This encounter was created in error - please disregard.

## 2021-12-13 ENCOUNTER — Other Ambulatory Visit: Payer: Self-pay | Admitting: *Deleted

## 2021-12-13 ENCOUNTER — Inpatient Hospital Stay: Payer: Medicare Other | Attending: Oncology

## 2021-12-13 ENCOUNTER — Inpatient Hospital Stay: Payer: Medicare Other

## 2021-12-13 ENCOUNTER — Other Ambulatory Visit: Payer: Self-pay

## 2021-12-13 ENCOUNTER — Encounter: Payer: Self-pay | Admitting: Oncology

## 2021-12-13 ENCOUNTER — Inpatient Hospital Stay (HOSPITAL_BASED_OUTPATIENT_CLINIC_OR_DEPARTMENT_OTHER): Payer: Medicare Other | Admitting: Oncology

## 2021-12-13 VITALS — BP 139/76 | HR 74 | Temp 99.0°F | Resp 17 | Wt 133.0 lb

## 2021-12-13 DIAGNOSIS — C7951 Secondary malignant neoplasm of bone: Secondary | ICD-10-CM

## 2021-12-13 DIAGNOSIS — N179 Acute kidney failure, unspecified: Secondary | ICD-10-CM | POA: Diagnosis not present

## 2021-12-13 DIAGNOSIS — Z5112 Encounter for antineoplastic immunotherapy: Secondary | ICD-10-CM | POA: Diagnosis present

## 2021-12-13 DIAGNOSIS — N184 Chronic kidney disease, stage 4 (severe): Secondary | ICD-10-CM

## 2021-12-13 DIAGNOSIS — Z5111 Encounter for antineoplastic chemotherapy: Secondary | ICD-10-CM | POA: Insufficient documentation

## 2021-12-13 DIAGNOSIS — C787 Secondary malignant neoplasm of liver and intrahepatic bile duct: Secondary | ICD-10-CM | POA: Insufficient documentation

## 2021-12-13 DIAGNOSIS — C50911 Malignant neoplasm of unspecified site of right female breast: Secondary | ICD-10-CM | POA: Insufficient documentation

## 2021-12-13 DIAGNOSIS — C50919 Malignant neoplasm of unspecified site of unspecified female breast: Secondary | ICD-10-CM

## 2021-12-13 DIAGNOSIS — C779 Secondary and unspecified malignant neoplasm of lymph node, unspecified: Secondary | ICD-10-CM | POA: Insufficient documentation

## 2021-12-13 DIAGNOSIS — R443 Hallucinations, unspecified: Secondary | ICD-10-CM

## 2021-12-13 DIAGNOSIS — F988 Other specified behavioral and emotional disorders with onset usually occurring in childhood and adolescence: Secondary | ICD-10-CM

## 2021-12-13 DIAGNOSIS — C50511 Malignant neoplasm of lower-outer quadrant of right female breast: Secondary | ICD-10-CM | POA: Diagnosis present

## 2021-12-13 DIAGNOSIS — Z170 Estrogen receptor positive status [ER+]: Secondary | ICD-10-CM | POA: Diagnosis not present

## 2021-12-13 DIAGNOSIS — G620 Drug-induced polyneuropathy: Secondary | ICD-10-CM | POA: Diagnosis not present

## 2021-12-13 DIAGNOSIS — G893 Neoplasm related pain (acute) (chronic): Secondary | ICD-10-CM | POA: Insufficient documentation

## 2021-12-13 DIAGNOSIS — Z8042 Family history of malignant neoplasm of prostate: Secondary | ICD-10-CM | POA: Insufficient documentation

## 2021-12-13 DIAGNOSIS — Z808 Family history of malignant neoplasm of other organs or systems: Secondary | ICD-10-CM

## 2021-12-13 DIAGNOSIS — I129 Hypertensive chronic kidney disease with stage 1 through stage 4 chronic kidney disease, or unspecified chronic kidney disease: Secondary | ICD-10-CM | POA: Diagnosis not present

## 2021-12-13 DIAGNOSIS — T451X5A Adverse effect of antineoplastic and immunosuppressive drugs, initial encounter: Secondary | ICD-10-CM

## 2021-12-13 LAB — COMPREHENSIVE METABOLIC PANEL
ALT: 24 U/L (ref 0–44)
AST: 26 U/L (ref 15–41)
Albumin: 3.6 g/dL (ref 3.5–5.0)
Alkaline Phosphatase: 75 U/L (ref 38–126)
Anion gap: 6 (ref 5–15)
BUN: 32 mg/dL — ABNORMAL HIGH (ref 8–23)
CO2: 29 mmol/L (ref 22–32)
Calcium: 8.9 mg/dL (ref 8.9–10.3)
Chloride: 103 mmol/L (ref 98–111)
Creatinine, Ser: 1.26 mg/dL — ABNORMAL HIGH (ref 0.44–1.00)
GFR, Estimated: 44 mL/min — ABNORMAL LOW (ref >60)
Glucose, Bld: 117 mg/dL — ABNORMAL HIGH (ref 70–99)
Potassium: 3.8 mmol/L (ref 3.5–5.1)
Sodium: 138 mmol/L (ref 135–145)
Total Bilirubin: 0.3 mg/dL (ref 0.3–1.2)
Total Protein: 6.8 g/dL (ref 6.5–8.1)

## 2021-12-13 LAB — CBC WITH DIFFERENTIAL/PLATELET
Abs Immature Granulocytes: 0.07 10*3/uL (ref 0.00–0.07)
Basophils Absolute: 0 10*3/uL (ref 0.0–0.1)
Basophils Relative: 0 %
Eosinophils Absolute: 0.1 10*3/uL (ref 0.0–0.5)
Eosinophils Relative: 1 %
HCT: 30.6 % — ABNORMAL LOW (ref 36.0–46.0)
Hemoglobin: 9.5 g/dL — ABNORMAL LOW (ref 12.0–15.0)
Immature Granulocytes: 1 %
Lymphocytes Relative: 9 %
Lymphs Abs: 0.9 10*3/uL (ref 0.7–4.0)
MCH: 27.6 pg (ref 26.0–34.0)
MCHC: 31 g/dL (ref 30.0–36.0)
MCV: 89 fL (ref 80.0–100.0)
Monocytes Absolute: 0.6 10*3/uL (ref 0.1–1.0)
Monocytes Relative: 6 %
Neutro Abs: 8 10*3/uL — ABNORMAL HIGH (ref 1.7–7.7)
Neutrophils Relative %: 83 %
Platelets: 334 10*3/uL (ref 150–400)
RBC: 3.44 MIL/uL — ABNORMAL LOW (ref 3.87–5.11)
RDW: 17.8 % — ABNORMAL HIGH (ref 11.5–15.5)
WBC: 9.7 10*3/uL (ref 4.0–10.5)
nRBC: 0 % (ref 0.0–0.2)

## 2021-12-13 MED ORDER — SODIUM CHLORIDE 0.9 % IV SOLN
Freq: Once | INTRAVENOUS | Status: AC
  Filled 2021-12-13: qty 250

## 2021-12-13 MED ORDER — HEPARIN SOD (PORK) LOCK FLUSH 100 UNIT/ML IV SOLN
INTRAVENOUS | Status: AC
  Filled 2021-12-13: qty 5

## 2021-12-13 MED ORDER — SODIUM CHLORIDE 0.9 % IV SOLN
420.0000 mg | Freq: Once | INTRAVENOUS | Status: AC
  Administered 2021-12-13: 420 mg via INTRAVENOUS
  Filled 2021-12-13: qty 14

## 2021-12-13 MED ORDER — TRASTUZUMAB-DKST CHEMO 150 MG IV SOLR
6.0000 mg/kg | Freq: Once | INTRAVENOUS | Status: AC
  Administered 2021-12-13: 420 mg via INTRAVENOUS
  Filled 2021-12-13: qty 20

## 2021-12-13 MED ORDER — SODIUM CHLORIDE 0.9 % IV SOLN
10.0000 mg | Freq: Once | INTRAVENOUS | Status: AC
  Administered 2021-12-13: 10 mg via INTRAVENOUS
  Filled 2021-12-13: qty 10

## 2021-12-13 MED ORDER — SODIUM CHLORIDE 0.9 % IV SOLN
65.0000 mg/m2 | Freq: Once | INTRAVENOUS | Status: AC
  Administered 2021-12-13: 114 mg via INTRAVENOUS
  Filled 2021-12-13: qty 19

## 2021-12-13 MED ORDER — FAMOTIDINE IN NACL 20-0.9 MG/50ML-% IV SOLN
20.0000 mg | Freq: Once | INTRAVENOUS | Status: AC
  Administered 2021-12-13: 20 mg via INTRAVENOUS
  Filled 2021-12-13: qty 50

## 2021-12-13 MED ORDER — ACETAMINOPHEN 325 MG PO TABS
650.0000 mg | ORAL_TABLET | Freq: Once | ORAL | Status: AC
  Administered 2021-12-13: 650 mg via ORAL
  Filled 2021-12-13: qty 2

## 2021-12-13 MED ORDER — DIPHENHYDRAMINE HCL 50 MG/ML IJ SOLN
50.0000 mg | Freq: Once | INTRAMUSCULAR | Status: AC
  Administered 2021-12-13: 50 mg via INTRAVENOUS
  Filled 2021-12-13: qty 1

## 2021-12-13 MED ORDER — HEPARIN SOD (PORK) LOCK FLUSH 100 UNIT/ML IV SOLN
500.0000 [IU] | Freq: Once | INTRAVENOUS | Status: DC | PRN
  Filled 2021-12-13: qty 5

## 2021-12-13 NOTE — Progress Notes (Addendum)
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  Cycle 9-day 1  Heme/Onc history: Patient is a 76 year old female with a past medical history significant for stage IV CKD, history of DVT on Xarelto, venous stasis and chronic right lower extremity ulceration hypertension among other medical problems.  She had a screening mammogram in September 2014 which showed 2.1 x 2.3 x 1.8 cm irregular mass in her right breast.  It was ER 10% positive PR negative and HER2 positive +3.  She received neoadjuvant chemotherapy with Taxol Herceptin and Perjeta for 4 cycles followed by dose dense AC/Herceptin x4 which she completed in March 2015.  She had a right lumpectomy on 04/03/2014 which showed scant residual invasive ductal carcinoma [id number].  She completed 1 year of adjuvant Herceptin chemotherapy and also completed adjuvant radiation treatment.  She was recommended anastrozole which she took on and off starting November 2015 and stopped sometime in 2020.   She was then hospitalized with neck pain and was found to have lytic lesions involving C5-C6 with pathological vertebral fractures.  She underwent radiation treatment to this area.  Image guided biopsy of the L1 vertebral body showed metastatic carcinoma consistent with breast origin ER 10% PR 0% and HER2 amplified ratio 5.1 average HER2 signal number per cell 15.0 average CEP 17 signals number per cell 3.0.  Baseline echocardiogram on 05/02/2021 showed a normal EF of 62% she was recommended Taxol Herceptin and Perjeta which  she received for 2 cycles at [facility] until June 10, 2021   She has chronic pain from her bone metastases for which she is currently on oxycodone 10 mg every 4 hours as needed and 12 mcg fentanyl patch.  She was seeing pain clinic when she was living in [name].   Patient has also been on Zometa when she was in [name] but she does have some ongoing dental issues.  She has received Xgeva in the past as well.  Her last Delton See was in July 2021.  Last PET scan was on 03/21/2021 which showed diffuse osseous metastatic disease involving the head neck chest abdomen and pelvis and spine.  Left lung apex hypermetabolic nodule and multiple hypermetabolic liver lesions concerning for disease involvement.  Enlarged hypermetabolic left inguinal lymph nodes along with hypermetabolic external iliac and left supraclavicular lymph nodes   Patient is now moved to [place] to be close to her daughter.  She lives in an independent living. Currently patient is getting Taxol Herceptin and Perjeta every 3 weeks with weekly Taxol    Interval history-patient was admitted to the hospital for right lower extremity cellulitis and right knee pseudogout which was treated and patient was discharged to [facility].  Presently she feels well and denies any specific complaints at this time.  Neuropathy has been stable  ECOG PS- 2 Pain scale- 0   Review of systems- Review of Systems  Constitutional:  Positive for malaise/fatigue. Negative for chills, fever and weight loss.  HENT:  Negative for congestion, ear discharge and nosebleeds.   Eyes:  Negative for blurred vision.  Respiratory:  Negative for cough, hemoptysis, sputum  production, shortness of breath and wheezing.   Cardiovascular:  Negative for chest pain, palpitations, orthopnea and claudication.  Gastrointestinal:  Negative for abdominal pain, blood in stool, constipation, diarrhea, heartburn, melena, nausea and vomiting.  Genitourinary:  Negative for dysuria,  flank pain, frequency, hematuria and urgency.  Musculoskeletal:  Negative for back pain, joint pain and myalgias.  Skin:  Negative for rash.  Neurological:  Positive for sensory change (Peripheral neuropathy). Negative for dizziness, tingling, focal weakness, seizures, weakness and headaches.  Endo/Heme/Allergies:  Does not bruise/bleed easily.  Psychiatric/Behavioral:  Negative for depression and suicidal ideas. The patient does not have insomnia.      Allergies  Allergen Reactions   Corticosteroids Other (See Comments)    Pt trf from [name] and per primary md for her cancer tx. Notes that it causes agitation intolerance   Sulfa Antibiotics Other (See Comments)    Pt moved from [name] and in MD notes she has allergy but we do not know reactions when taking the drug   Celebrex [Celecoxib] Rash     Past Medical History:  Diagnosis Date   ADHD (attention deficit hyperactivity disorder)    in [place], no date on md note   Anemia    IDA 11/26/2019, Anemia in stage 4 chronic kidney disease ([place]) 09/03/2020   Arthritis    osteoarthritis right knee 09/30/2014   Breast cancer ([place]) 10/05/2013   in [place] +, [place] -, Her 2 is 3+   Cancer related pain 03/28/2021   in [name], md notes spine mets   DVT of lower extremity, bilateral ([place]) 03/30/2014   in [name]   Generalized muscle weakness 03/31/2016   in [name]   GERD (gastroesophageal reflux disease) 05/15/2013   per md in [name]   Hyperparathyroidism, secondary ([place]) 04/25/2018   in [name]   Hypertension 02/20/2016   info from MD in [facility]   Lumbar compression fracture ([place]) 03/18/2021   in [place] loss 02/05/2015   in [name]   Metabolic acidosis 38/18/2993   in [place]   Metabolic syndrome 71/69/6789   in [name]   Osteopenia after menopause 03/29/2016   in [place] of both eyes 09/30/2012   per md in [name] where pt. 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   in [name]   Stasis ulcer of left lower extremity ([place])  03/29/2016   in [name]     Past Surgical History:  Procedure Laterality Date   CESAREAN SECTION     unknown   fibroid removed  N/A    in [place] - unknown date   IR FLUORO GUIDE CV LINE LEFT  07/27/2021   IR PORT REPAIR CENTRAL VENOUS ACCESS DEVICE Left    In [place] N/A 02/25/2014   in [name]   ovary removed      unknown   [place] CATARACT EXTRACAP,INSERT LENS Bilateral  Bilateral 09/05/2012   in [place]     unknown    LUMPECTOMY Right 04/03/2014   in [place]    Social History   Socioeconomic History   Marital status: Divorced    Spouse name: Not on file   Number of children: Not on file   Years of education: Not on file   Highest education level: Not on file  Occupational History   Occupation: retired Teacher, music    Comment: In [place]  Tobacco Use   Smoking status: Never   Smokeless tobacco:  Never  Vaping Use   Vaping Use: Never used  Substance and Sexual Activity   Alcohol use: Not Currently   Drug use: Never   Sexual activity: Not Currently  Other Topics Concern   Not on file  Social History Narrative   Not on file   Social Determinants of Health   Financial Resource Strain: Not on file  Food Insecurity: Not on file  Transportation Needs: Not on file  Physical Activity: Not on file  Stress: Not on file  Social Connections: Not on file  Intimate Partner Violence: Not on file    Family History  Problem Relation Age of Onset   Pancreatic cancer Mother    Stroke Father    Diabetes Father    Hypertension Father    Heart disease Father    Skin cancer Father    Varicose Veins Father    Skin cancer Brother    Cancer - Prostate Brother      Current Outpatient Medications:    acetaminophen (TYLENOL) 500 MG tablet, Take 500-1,000 mg by mouth every 6 (six) hours as needed for mild pain or moderate pain., Disp: , Rfl:    amphetamine-dextroamphetamine (ADDERALL XR) 30 MG 24 hr capsule, Take 1 capsule (30 mg total) by mouth  daily., Disp: 15 capsule, Rfl: 0   Calcium 200 MG TABS, Take 1 tablet by mouth daily., Disp: , Rfl:    colchicine 0.6 MG tablet, Take 1 tablet (0.6 mg total) by mouth 2 (two) times daily., Disp: , Rfl:    fentaNYL (DURAGESIC) 12 MCG/HR, Place 1 patch onto the skin every 3 (three) days. 1 patch to skin. Next dose on 12/10/2021 at 10:40 pm, Disp: 3 patch, Rfl: 0   lisinopril (ZESTRIL) 20 MG tablet, Take 20 mg by mouth daily., Disp: , Rfl:    Multiple Vitamins-Minerals (MULTIVITAMIN ADULTS) TABS, Take 1 tablet by mouth daily., Disp: , Rfl:    Oxycodone HCl 10 MG TABS, Take 1 tablet (10 mg total) by mouth every 4 (four) hours as needed., Disp: 12 tablet, Rfl: 0   sodium hypochlorite (DAKIN'S 1/4 STRENGTH) 0.125 % SOLN, Apply 1 application topically as directed. Moisten gauze with solution and wrap wound, Disp: , Rfl:    terbinafine (LAMISIL AT) 1 % cream, Apply 1 application topically 2 (two) times daily. (Patient taking differently: Apply 1 application topically 2 (two) times daily as needed (fungal treatment).), Disp: 30 g, Rfl: 5 No current facility-administered medications for this visit.  Facility-Administered Medications Ordered in Other Visits:    heparin lock flush 100 unit/mL, 500 Units, Intracatheter, Once PRN, [name], MD   PACLitaxel (TAXOL) 114 mg in sodium chloride 0.9 % 250 mL chemo infusion (</= 80mg /m2), 65 mg/m2 (Treatment Plan Recorded), Intravenous, Once, [name], MD   pertuzumab (PERJETA) 420 mg in sodium chloride 0.9 % 250 mL chemo infusion, 420 mg, Intravenous, Once, [name], MD, Last Rate: 528 mL/hr at 12/13/21 1219, 420 mg at 12/13/21 1219   sodium chloride flush (NS) 0.9 % injection 10 mL, 10 mL, Intracatheter, PRN, [name], MD  Physical exam:  Vitals:   12/13/21 0917  BP: 139/76  Pulse: 74  Resp: 17  Temp: 99 F (37.2 C)  TempSrc: Tympanic  SpO2: 99%  Weight: 133 lb (60.3 kg)   Physical Exam Constitutional:      General: She is not in  acute distress. Cardiovascular:     Rate and Rhythm: Normal rate and regular rhythm.  Heart sounds: Normal heart sounds.  Pulmonary:     Effort: Pulmonary effort is normal.     Breath sounds: Normal breath sounds.  Abdominal:     General: Bowel sounds are normal.     Palpations: Abdomen is soft.  Skin:    General: Skin is warm and dry.  Neurological:     Mental Status: She is alert and oriented to person, place, and time.     CMP Latest Ref Rng & Units 12/13/2021  Glucose 70 - 99 mg/dL 117(H)  BUN 8 - 23 mg/dL 32(H)  Creatinine 0.44 - 1.00 mg/dL 1.26(H)  Sodium 135 - 145 mmol/L 138  Potassium 3.5 - 5.1 mmol/L 3.8  Chloride 98 - 111 mmol/L 103  CO2 22 - 32 mmol/L 29  Calcium 8.9 - 10.3 mg/dL 8.9  Total Protein 6.5 - 8.1 g/dL 6.8  Total Bilirubin 0.3 - 1.2 mg/dL 0.3  Alkaline Phos 38 - 126 U/L 75  AST 15 - 41 U/L 26  ALT 0 - 44 U/L 24   CBC Latest Ref Rng & Units 12/13/2021  WBC 4.0 - 10.5 K/uL 9.7  Hemoglobin 12.0 - 15.0 g/dL 9.5(L)  Hematocrit 36.0 - 46.0 % 30.6(L)  Platelets 150 - 400 K/uL 334    No images are attached to the encounter.  DG Chest 2 View  Result Date: 12/02/2021 CLINICAL DATA:  Sepsis EXAM: CHEST - 2 VIEW COMPARISON:  07/01/2021 FINDINGS: Left-sided Port-A-Cath in stable positioning. The heart size and mediastinal contours are within normal limits. Chronic elevation of the right hemidiaphragm. No focal airspace consolidation, pleural effusion, or pneumothorax. No appreciable interval change in appearance of sclerotic osseous metastatic disease. IMPRESSION: No active cardiopulmonary disease. Electronically Signed   By: Davina Poke D.O.   On: 12/02/2021 15:12   DG Knee 2 Views Right  Result Date: 12/02/2021 CLINICAL DATA:  Pain and swelling EXAM: RIGHT KNEE - 1-2 VIEW COMPARISON:  None. FINDINGS: No evidence of fracture or dislocation. At least moderate volume joint effusion. Severe tricompartmental degenerative changes. No aggressive appearing focal  bone abnormality. Soft tissues are unremarkable. IMPRESSION: 1. Tricompartmental severe degenerative changes of the right knee with associated joint effusion. 2.  No acute displaced fracture or dislocation. Electronically Signed   By: Iven Finn M.D.   On: 12/02/2021 15:09   CT Head Wo Contrast  Result Date: 12/02/2021 CLINICAL DATA:  Altered mental status, neck pain EXAM: CT HEAD WITHOUT CONTRAST TECHNIQUE: Contiguous axial images were obtained from the base of the skull through the vertex without intravenous contrast. COMPARISON:  Brain MRI 11/28/2021 FINDINGS: Brain: There is no evidence of acute intracranial hemorrhage, extra-axial fluid collection, or acute infarct. Parenchymal volume is normal. The ventricles are normal in size. There is no mass lesion. There is no midline shift. Vascular: No hyperdense vessel or unexpected calcification. Skull: Normal. Negative for fracture or focal lesion. Sinuses/Orbits: The paranasal sinuses are clear. Bilateral lens implants are in place. The globes and orbits are otherwise unremarkable. Other: None. IMPRESSION: No acute intracranial pathology. Electronically Signed   By: Valetta Mole M.D.   On: 12/02/2021 15:17   CT Cervical Spine Wo Contrast  Result Date: 12/02/2021 CLINICAL DATA:  Neck pain, history of breast cancer with metastatic disease EXAM: CT CERVICAL SPINE WITHOUT CONTRAST TECHNIQUE: Multidetector CT imaging of the cervical spine was performed without intravenous contrast. Multiplanar CT image reconstructions were also generated. COMPARISON:  None. FINDINGS: Alignment: There is trace retrolisthesis of C3 on C4 and grade 1 anterolisthesis of  C4 on C5. There is no jumped or perched facets or other evidence of traumatic malalignment. Skull base and vertebrae: Skull base alignment is maintained. Vertebral body heights are preserved. There is partial fusion of the C2 and C3 vertebral bodies. There is no evidence of acute fracture. There is extensive  sclerosis throughout the cervical spine below the C3 level consistent with diffuse osseous metastatic disease. Soft tissues and spinal canal: No prevertebral fluid or swelling. No visible canal hematoma. Disc levels: There is mild multilevel degenerative endplate change and facet arthropathy throughout the cervical spine, most advanced at C4-C5 through C6-C7. The osseous spinal canal is patent. There is moderate to severe bilateral neural foraminal stenosis at C4-C5 through C6-C7. Upper chest: The imaged lung apices are clear. Other: Left chest port tubing is partially imaged. IMPRESSION: 1. No acute fracture or traumatic malalignment of the cervical spine. 2. Extensive sclerosis throughout the cervical spine below the C3 level consistent with extensive osseous metastatic disease. 3. Grade 1 anterolisthesis of C4 on C5 retrolisthesis of C3 on C4 and grade 1 anterolisthesis of C4 on C5, likely degenerative in nature. 4. Multilevel degenerative changes throughout the cervical spine as above. Electronically Signed   By: Valetta Mole M.D.   On: 12/02/2021 15:21   MR Brain W Wo Contrast  Result Date: 11/28/2021 CLINICAL DATA:  Metastatic breast cancer. EXAM: MRI HEAD WITHOUT AND WITH CONTRAST TECHNIQUE: Multiplanar, multiecho pulse sequences of the brain and surrounding structures were obtained without and with intravenous contrast. CONTRAST:  21mL GADAVIST GADOBUTROL 1 MMOL/ML IV SOLN COMPARISON:  None. FINDINGS: Brain: There is no evidence of an acute infarct, intracranial hemorrhage, mass, midline shift, or extra-axial fluid collection. The ventricles and sulci are normal. Small T2 hyperintensities in the cerebral white matter bilaterally are nonspecific but compatible with mild chronic small vessel ischemic disease. No abnormal enhancement is identified. Vascular: Major intracranial vascular flow voids are preserved. Skull and upper cervical spine: Hyperostosis frontalis. No suspicious marrow lesion.  Sinuses/Orbits: Bilateral cataract extraction. Minimal mucosal thickening in the right maxillary sinus. Clear mastoid air cells. Other: None. IMPRESSION: 1. No evidence of intracranial metastases. 2. Mild chronic small vessel ischemic disease. Electronically Signed   By: Logan Bores M.D.   On: 11/28/2021 17:02   MR KNEE RIGHT WO CONTRAST  Result Date: 12/03/2021 CLINICAL DATA:  Right knee pain and swelling. Septic arthritis suspected, knee, xray done EXAM: MRI OF THE RIGHT KNEE WITHOUT CONTRAST TECHNIQUE: Multiplanar, multisequence MR imaging of the knee was performed. No intravenous contrast was administered. COMPARISON:  X-ray 12/02/2021 FINDINGS: Technical Note: Despite efforts by the technologist and patient, motion artifact is present on today's exam and could not be eliminated. This reduces exam sensitivity and specificity. MENISCI Medial meniscus: Vertical tear involving the peripheral aspect of the medial meniscal posterior horn extending towards the body segment (series 18, images 26-29). Medial meniscus is diminutive and degenerated in appearance. Lateral meniscus: Complex tearing/maceration of the visualized portions of the lateral meniscus. No significant meniscal tissue is seen in the region of the lateral meniscal body. LIGAMENTS Cruciates: ACL is markedly degenerated or torn.  PCL intact. Collaterals: Intact MCL. Proximal fibular collateral ligament is ill-defined, degenerated or torn. Distal attachment the IT band is also degenerated in appearance. Lateral collateral ligament complex otherwise intact. CARTILAGE Patellofemoral: High-grade chondral loss within the trochlear groove. Medial: Partial-thickness chondral loss and irregularity of the weight-bearing medial compartment. Lateral: Extensive full-thickness cartilage loss of the lateral compartment with remodeling of the lateral tibial plateau. MISCELLANEOUS  Joint: Large joint effusion with a few intra-articular loose bodies. Internal  heterogeneity within the joint suggests synovitis. Edematous appearance of Hoffa's fat. Popliteal Fossa: Trace fluid within a Baker's cyst. Grossly intact popliteus tendon. Extensor Mechanism:  Intact quadriceps and patellar tendons. Bones: Tricompartmental joint space loss with bulky marginal osteophytes, most pronounced within the lateral compartment. Patchy subchondral bone marrow edema about the knee, most pronounced laterally, favored to be degenerative/reactive. No fracture. No suspicious marrow replacing bone lesion. Other: Multiple ganglia are seen along the posterior aspect of the knee and extending inferiorly from the proximal tibiofibular joint. Circumferential subcutaneous edema, nonspecific. IMPRESSION: 1. Advanced tricompartmental osteoarthritis, most severe within the lateral compartment. 2. Complex tearing/maceration of the visualized portions of the lateral meniscus. 3. Vertical tear involving the peripheral aspect of the medial meniscal posterior horn. 4. Large joint effusion with a few intra-articular loose bodies. Internal heterogeneity within the joint suggests synovitis. Findings may be reactive secondary to underlying arthropathy. Septic arthritis is not entirely excluded. Correlate with arthrocentesis results. Electronically Signed   By: Davina Poke D.O.   On: 12/03/2021 13:56   VAS [place] LOWER EXTREMITY VENOUS REFLUX  Result Date: 12/07/2021  Lower Venous Reflux Study Patient Name:  SERETHA ESTABROOKS  Date of Exam:   11/29/2021 Medical Rec #: [id number]         Accession #:    [id number] Date of Birth: Jun 12, 1946         Patient Gender: F Patient Age:   8 years Exam Location:  [facility] Procedure:      VAS [place] LOWER EXTREMITY VENOUS REFLUX Referring Phys: Eulogio Ditch --------------------------------------------------------------------------------  Indications: Swelling, and right.  Performing Technologist: Concha Norway RVT  Examination Guidelines: A complete evaluation  includes B-mode imaging, spectral Doppler, color Doppler, and power Doppler as needed of all accessible portions of each vessel. Bilateral testing is considered an integral part of a complete examination. Limited examinations for reoccurring indications may be performed as noted. The reflux portion of the exam is performed with the patient in reverse Trendelenburg. Significant venous reflux is defined as >500 ms in the superficial venous system, and >1 second in the deep venous system.  Venous Reflux Times +-------------+---------+------+-----------+------------+--------+  RIGHT         Reflux No Reflux Reflux Time Diameter cms Comments                            Yes                                      +-------------+---------+------+-----------+------------+--------+  GSV mid thigh            yes     >500 ms       .72                +-------------+---------+------+-----------+------------+--------+  GSV at knee              yes     >500 ms       .61                +-------------+---------+------+-----------+------------+--------+  GSV prox calf            yes     >500 ms       .48                +-------------+---------+------+-----------+------------+--------+  +-------------+---------+------+-----------+------------+--------+  LEFT          Reflux No Reflux Reflux Time Diameter cms Comments                            Yes                                      +-------------+---------+------+-----------+------------+--------+  GSV mid thigh            yes     >500 ms       .14                +-------------+---------+------+-----------+------------+--------+   Summary: Right: - No evidence of deep vein thrombosis seen in the right lower extremity, from the common femoral through the popliteal veins. - No evidence of superficial venous thrombosis in the right lower extremity. - No evidence of superficial venous reflux seen in the right short saphenous vein. - Venous reflux is noted in the right greater saphenous vein  in the thigh. - Venous reflux is noted in the right greater saphenous vein in the calf.  Left: - No evidence of superficial venous thrombosis in the left lower extremity. - Venous reflux is noted in the left greater saphenous vein in the thigh. - Partial compression and chronic appearing thrombus ling the wall of the mid femoral vein only.  *See table(s) above for measurements and observations. Electronically signed by Leotis Pain MD on 12/07/2021 at 8:46:48 AM.    Final      Assessment and plan- Patient is a 76 y.o. female with metastatic weakly ER positive HER2 positive breast cancer with liver lymph node and bone metastases.  She is here for on treatment assessment prior to cycle 9-day 1 of Taxol Herceptin and Perjeta  Patient's chemotherapy was delayed due to recent hospitalization.  She is recovered well from the hospitalization and is currently at the rehab.  Counts are otherwise okay to proceed with cycle 9-day 1 of Taxol Herceptin and Perjeta today.  She will receive Taxol treatment alone next week.  I will see her back in 3 weeks for cycle 10-day 1 of treatment.  We are giving her Taxol 2 weeks on 1 week off to prevent any worsening of neuropathy.  Patient will be due for repeat scans end of February 2022.  Bone metastases: She will receive Zometa next week.  Last dose given on 11/18/2022   Visit Diagnosis 1. Encounter for antineoplastic chemotherapy   2. Encounter for monoclonal antibody treatment for malignancy   3. Chemotherapy-induced peripheral neuropathy ([place])   4. Metastatic breast cancer [facility])      Dr. Randa Evens, MD, MPH [facility] [phone] 12/13/2021 12:49 PM    Addendum: port does not yield blood return but flushes well. Ok to use it at this time. Will attempt dye study in the next week or 2.  Dr. Randa Evens, MD, MPH [facility] [phone] 12/21/2021 8:55 AM

## 2021-12-13 NOTE — Progress Notes (Signed)
Per MD ok to continue maintenance ogivri 6mg /kg dosing.

## 2021-12-13 NOTE — Progress Notes (Signed)
Patient here for oncology follow-up appointment, concerns of neuropathy in hands

## 2021-12-13 NOTE — Progress Notes (Signed)
No blood return noted from port after multiple flushes and different positions were attempted. No pain, swelling, discomfort, or redness noted while port was being flushed. Port flushes with ease. Dye study was performed on 07/27/21, "IMPRESSION: Patent left IJ power port catheter. Tip is short in the proximal SVC. Negative for fibrin sheath. " MD made aware. Per MD to proceed with treatment today. Pt updated and agrees to plan. Pt stable.   Shenita Trego [employer]

## 2021-12-20 ENCOUNTER — Inpatient Hospital Stay: Payer: Medicare Other

## 2021-12-20 ENCOUNTER — Telehealth: Payer: Self-pay | Admitting: Oncology

## 2021-12-20 NOTE — Telephone Encounter (Signed)
Caregiver called to reschedule appt for today. Transportation problems this morning. Call back at [phone] ask for Zenovia Jordan.

## 2021-12-20 NOTE — Telephone Encounter (Signed)
Keep as is.

## 2021-12-21 ENCOUNTER — Inpatient Hospital Stay: Payer: Medicare Other

## 2021-12-21 ENCOUNTER — Other Ambulatory Visit: Payer: Self-pay

## 2021-12-21 VITALS — BP 111/66 | HR 61 | Temp 96.6°F | Resp 18 | Wt 142.0 lb

## 2021-12-21 DIAGNOSIS — C50919 Malignant neoplasm of unspecified site of unspecified female breast: Secondary | ICD-10-CM

## 2021-12-21 DIAGNOSIS — Z5111 Encounter for antineoplastic chemotherapy: Secondary | ICD-10-CM | POA: Diagnosis not present

## 2021-12-21 LAB — COMPREHENSIVE METABOLIC PANEL
ALT: 17 U/L (ref 0–44)
AST: 20 U/L (ref 15–41)
Albumin: 3.8 g/dL (ref 3.5–5.0)
Alkaline Phosphatase: 64 U/L (ref 38–126)
Anion gap: 5 (ref 5–15)
BUN: 28 mg/dL — ABNORMAL HIGH (ref 8–23)
CO2: 25 mmol/L (ref 22–32)
Calcium: 8.2 mg/dL — ABNORMAL LOW (ref 8.9–10.3)
Chloride: 107 mmol/L (ref 98–111)
Creatinine, Ser: 1.32 mg/dL — ABNORMAL HIGH (ref 0.44–1.00)
GFR, Estimated: 42 mL/min — ABNORMAL LOW (ref >60)
Glucose, Bld: 108 mg/dL — ABNORMAL HIGH (ref 70–99)
Potassium: 3.9 mmol/L (ref 3.5–5.1)
Sodium: 137 mmol/L (ref 135–145)
Total Bilirubin: 0.4 mg/dL (ref 0.3–1.2)
Total Protein: 6.7 g/dL (ref 6.5–8.1)

## 2021-12-21 LAB — CBC WITH DIFFERENTIAL/PLATELET
Abs Immature Granulocytes: 0.02 10*3/uL (ref 0.00–0.07)
Basophils Absolute: 0 10*3/uL (ref 0.0–0.1)
Basophils Relative: 0 %
Eosinophils Absolute: 0.1 10*3/uL (ref 0.0–0.5)
Eosinophils Relative: 2 %
HCT: 34.1 % — ABNORMAL LOW (ref 36.0–46.0)
Hemoglobin: 9.9 g/dL — ABNORMAL LOW (ref 12.0–15.0)
Immature Granulocytes: 0 %
Lymphocytes Relative: 13 %
Lymphs Abs: 0.7 10*3/uL (ref 0.7–4.0)
MCH: 28.1 pg (ref 26.0–34.0)
MCHC: 29 g/dL — ABNORMAL LOW (ref 30.0–36.0)
MCV: 96.9 fL (ref 80.0–100.0)
Monocytes Absolute: 0.4 10*3/uL (ref 0.1–1.0)
Monocytes Relative: 8 %
Neutro Abs: 3.7 10*3/uL (ref 1.7–7.7)
Neutrophils Relative %: 77 %
Platelets: 203 10*3/uL (ref 150–400)
RBC: 3.52 MIL/uL — ABNORMAL LOW (ref 3.87–5.11)
RDW: 18.6 % — ABNORMAL HIGH (ref 11.5–15.5)
WBC: 4.9 10*3/uL (ref 4.0–10.5)
nRBC: 0 % (ref 0.0–0.2)

## 2021-12-21 MED ORDER — SODIUM CHLORIDE 0.9 % IV SOLN
65.0000 mg/m2 | Freq: Once | INTRAVENOUS | Status: AC
  Administered 2021-12-21: 114 mg via INTRAVENOUS
  Filled 2021-12-21: qty 19

## 2021-12-21 MED ORDER — HEPARIN SOD (PORK) LOCK FLUSH 100 UNIT/ML IV SOLN
500.0000 [IU] | Freq: Once | INTRAVENOUS | Status: AC | PRN
  Filled 2021-12-21: qty 5

## 2021-12-21 MED ORDER — FAMOTIDINE IN NACL 20-0.9 MG/50ML-% IV SOLN
20.0000 mg | Freq: Once | INTRAVENOUS | Status: AC
  Administered 2021-12-21: 20 mg via INTRAVENOUS
  Filled 2021-12-21: qty 50

## 2021-12-21 MED ORDER — SODIUM CHLORIDE 0.9 % IV SOLN
10.0000 mg | Freq: Once | INTRAVENOUS | Status: AC
  Administered 2021-12-21: 10 mg via INTRAVENOUS
  Filled 2021-12-21: qty 10

## 2021-12-21 MED ORDER — SODIUM CHLORIDE 0.9 % IV SOLN
Freq: Once | INTRAVENOUS | Status: AC
  Filled 2021-12-21: qty 250

## 2021-12-21 MED ORDER — SODIUM CHLORIDE 0.9% FLUSH
10.0000 mL | INTRAVENOUS | Status: DC | PRN
  Administered 2021-12-21: 10 mL
  Filled 2021-12-21: qty 10

## 2021-12-21 MED ORDER — DIPHENHYDRAMINE HCL 50 MG/ML IJ SOLN
50.0000 mg | Freq: Once | INTRAMUSCULAR | Status: AC
  Administered 2021-12-21: 50 mg via INTRAVENOUS
  Filled 2021-12-21: qty 1

## 2021-12-21 MED ORDER — HEPARIN SOD (PORK) LOCK FLUSH 100 UNIT/ML IV SOLN
INTRAVENOUS | Status: AC
  Administered 2021-12-21: 500 [IU]
  Filled 2021-12-21: qty 5

## 2021-12-21 NOTE — Progress Notes (Signed)
1410- Implanted port does not yield blood return. Implanted port flushes without difficulty. No swelling, redness, or pain noted at port site. Dye study results from 07/27/2021 read, "IMPRESSION: Patent left IJ power port catheter. Tip is short in the proximal SVC. Negative for fibrin sheath." MD, Dr. Janese Banks, notified and already aware. Per MD order: okay to use implanted port for scheduled Taxol treatment today. MD documented as well.  3013- Creatinine: 1.32, Calcium: 8.2. MD, Dr. Janese Banks, notified. Per MD order: proceed with scheduled Taxol treatment only today; hold/do not give Zometa treatment today.

## 2021-12-21 NOTE — Patient Instructions (Signed)
[facility] [place]   Discharge Instructions: Thank you for choosing [facility] to provide your oncology and hematology care.  If you have a lab appointment with the [facility], please go directly to the [facility] and check in at the registration area.   Wear comfortable clothing and clothing appropriate for easy access to any Portacath or PICC line.   We strive to give you quality time with your provider. You may need to reschedule your appointment if you arrive late (15 or more minutes).  Arriving late affects you and other patients whose appointments are after yours.  Also, if you miss three or more appointments without notifying the office, you may be dismissed from the clinic at the providers discretion.      For prescription refill requests, have your pharmacy contact our office and allow 72 hours for refills to be completed.    Today you received the following chemotherapy and/or immunotherapy agents: Taxol.      To help prevent nausea and vomiting after your treatment, we encourage you to take your nausea medication as directed.  BELOW ARE SYMPTOMS THAT SHOULD BE REPORTED IMMEDIATELY: *FEVER GREATER THAN 100.4 F (38 C) OR HIGHER *CHILLS OR SWEATING *NAUSEA AND VOMITING THAT IS NOT CONTROLLED WITH YOUR NAUSEA MEDICATION *UNUSUAL SHORTNESS OF BREATH *UNUSUAL BRUISING OR BLEEDING *URINARY PROBLEMS (pain or burning when urinating, or frequent urination) *BOWEL PROBLEMS (unusual diarrhea, constipation, pain near the anus) TENDERNESS IN MOUTH AND THROAT WITH OR WITHOUT PRESENCE OF ULCERS (sore throat, sores in mouth, or a toothache) UNUSUAL RASH, SWELLING OR PAIN  UNUSUAL VAGINAL DISCHARGE OR ITCHING   Items with * indicate a potential emergency and should be followed up as soon as possible or go to the Emergency Department if any problems should occur.  Please show the CHEMOTHERAPY ALERT CARD or IMMUNOTHERAPY ALERT CARD at check-in to  the Emergency Department and triage nurse.  Should you have questions after your visit or need to cancel or reschedule your appointment, please contact [facility]  Dept: [phone]  and follow the prompts.  Office hours are 8:00 a.m. to 4:30 p.m. Monday - Friday. Please note that voicemails left after 4:00 p.m. may not be returned until the following business day.  We are closed weekends and major holidays. You have access to a nurse at all times for urgent questions. Please call the main number to the clinic Dept: [phone] and follow the prompts.  For any non-urgent questions, you may also contact your provider using MyChart. We now offer e-Visits for anyone 23 and older to request care online for non-urgent symptoms. For details visit [email or web address].   Also download the MyChart app! Go to the app store, search "MyChart", open the app, select [facility], and log in with your MyChart username and password.  Due to Covid, a mask is required upon entering the hospital/clinic. If you do not have a mask, one will be given to you upon arrival. For doctor visits, patients may have 1 support person aged 59 or older with them. For treatment visits, patients cannot have anyone with them due to current Covid guidelines and our immunocompromised population.

## 2021-12-28 ENCOUNTER — Other Ambulatory Visit
Admission: RE | Admit: 2021-12-28 | Discharge: 2021-12-28 | Disposition: A | Discharge status: Home / Self Care | Payer: Medicare Other | Source: Ambulatory Visit | Attending: Internal Medicine | Admitting: Internal Medicine

## 2021-12-28 ENCOUNTER — Encounter: Payer: Medicare Other | Attending: Internal Medicine | Admitting: Internal Medicine

## 2021-12-28 ENCOUNTER — Other Ambulatory Visit: Payer: Self-pay

## 2021-12-28 DIAGNOSIS — I87332 Chronic venous hypertension (idiopathic) with ulcer and inflammation of left lower extremity: Secondary | ICD-10-CM | POA: Diagnosis not present

## 2021-12-28 DIAGNOSIS — L97822 Non-pressure chronic ulcer of other part of left lower leg with fat layer exposed: Secondary | ICD-10-CM | POA: Diagnosis not present

## 2021-12-28 DIAGNOSIS — I87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: Secondary | ICD-10-CM | POA: Diagnosis not present

## 2021-12-28 DIAGNOSIS — S91201D Unspecified open wound of right great toe with damage to nail, subsequent encounter: Secondary | ICD-10-CM | POA: Insufficient documentation

## 2021-12-28 DIAGNOSIS — L089 Local infection of the skin and subcutaneous tissue, unspecified: Secondary | ICD-10-CM | POA: Insufficient documentation

## 2021-12-28 DIAGNOSIS — I872 Venous insufficiency (chronic) (peripheral): Secondary | ICD-10-CM | POA: Insufficient documentation

## 2021-12-28 DIAGNOSIS — I129 Hypertensive chronic kidney disease with stage 1 through stage 4 chronic kidney disease, or unspecified chronic kidney disease: Secondary | ICD-10-CM | POA: Insufficient documentation

## 2021-12-28 DIAGNOSIS — C79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: Secondary | ICD-10-CM | POA: Diagnosis not present

## 2021-12-28 DIAGNOSIS — Z7901 Long term (current) use of anticoagulants: Secondary | ICD-10-CM | POA: Diagnosis not present

## 2021-12-28 DIAGNOSIS — N184 Chronic kidney disease, stage 4 (severe): Secondary | ICD-10-CM | POA: Insufficient documentation

## 2021-12-28 DIAGNOSIS — X58XXXD Exposure to other specified factors, subsequent encounter: Secondary | ICD-10-CM | POA: Insufficient documentation

## 2021-12-28 DIAGNOSIS — Z86718 Personal history of other venous thrombosis and embolism: Secondary | ICD-10-CM | POA: Diagnosis not present

## 2021-12-28 NOTE — Progress Notes (Signed)
SHERRIL, SHIPMAN ([id number]) Visit Report for 12/28/2021 Chief Complaint Document Details Patient Name: Laura Santiago, Laura Santiago. Date of Service: 12/28/2021 9:00 AM Medical Record Number: [id number] Patient Account Number: [id number] Date of Birth/Sex: 1946-03-15 (76 y.o. F) Treating RN: Levora Dredge Primary Care Provider: Orvis Brill, DOCTORS Other Clinician: Referring Provider: Orvis Brill, DOCTORS Treating Provider/Extender: Yaakov Guthrie in Treatment: 25 Information Obtained from: Patient Chief Complaint Left lower extremity wound Right toe wounds Left upper lateral thigh wounds Electronic Signature(s) Signed: 12/28/2021 10:33:27 AM By: Kalman Shan DO Entered By: Kalman Shan on 12/28/2021 10:20:57 Laura Santiago ([id number]) -------------------------------------------------------------------------------- Debridement Details Patient Name: Laura Santiago. Date of Service: 12/28/2021 9:00 AM Medical Record Number: [id number] Patient Account Number: [id number] Date of Birth/Sex: [birth year] (76 y.o. F) Treating RN: Levora Dredge Primary Care Provider: Orvis Brill, DOCTORS Other Clinician: Referring Provider: Orvis Brill, DOCTORS Treating Provider/Extender: Yaakov Guthrie in Treatment: 25 Debridement Performed for Wound #2 Right Toe Great Assessment: Performed By: Physician Kalman Shan, MD Debridement Type: Debridement Level of Consciousness (Pre- Awake and Alert procedure): Pre-procedure Verification/Time Out Yes - 09:19 Taken: Total Area Debrided (L x W): 1 (cm) x 2 (cm) = 2 (cm) Tissue and other material Viable, Non-Viable, Skin: Dermis debrided: Level: Skin/Dermis Debridement Description: Selective/Open Wound Instrument: Curette Specimen: Swab, Number of Specimens Taken: 1 Bleeding: Minimum Hemostasis Achieved: Pressure Response to Treatment: Procedure was tolerated well Level of Consciousness (Post- Awake and  Alert procedure): Post Debridement Measurements of Total Wound Length: (cm) 1 Width: (cm) 2 Depth: (cm) 0.2 Volume: (cm) 0.314 Character of Wound/Ulcer Post Debridement: Stable Post Procedure Diagnosis Same as Pre-procedure Electronic Signature(s) Signed: 12/28/2021 10:33:27 AM By: Kalman Shan DO Signed: 12/28/2021 4:56:07 PM By: Levora Dredge Entered By: Levora Dredge on 12/28/2021 09:22:16 Laura Santiago ([id number]) -------------------------------------------------------------------------------- Debridement Details Patient Name: Laura Santiago. Date of Service: 12/28/2021 9:00 AM Medical Record Number: [id number] Patient Account Number: [id number] Date of Birth/Sex: [birth year] (76 y.o. F) Treating RN: Levora Dredge Primary Care Provider: Orvis Brill, DOCTORS Other Clinician: Referring Provider: Orvis Brill, DOCTORS Treating Provider/Extender: Yaakov Guthrie in Treatment: 25 Debridement Performed for Wound #1 Left,Medial Lower Leg Assessment: Performed By: Physician Kalman Shan, MD Debridement Type: Debridement Severity of Tissue Pre Debridement: Fat layer exposed Level of Consciousness (Pre- Awake and Alert procedure): Pre-procedure Verification/Time Out Yes - 09:23 Taken: Total Area Debrided (L x W): 9.8 (cm) x 8 (cm) = 78.4 (cm) Tissue and other material Viable, Non-Viable, Slough, Biofilm, Slough debrided: Level: Non-Viable Tissue Debridement Description: Selective/Open Wound Instrument: Curette Bleeding: Minimum Hemostasis Achieved: Pressure Response to Treatment: Procedure was tolerated well Level of Consciousness (Post- Awake and Alert procedure): Post Debridement Measurements of Total Wound Length: (cm) 9.8 Width: (cm) 8 Depth: (cm) 0.3 Volume: (cm) 18.473 Character of Wound/Ulcer Post Debridement: Stable Severity of Tissue Post Debridement: Fat layer exposed Post Procedure Diagnosis Same as Pre-procedure Electronic  Signature(s) Signed: 12/28/2021 10:33:27 AM By: Kalman Shan DO Signed: 12/28/2021 4:56:07 PM By: Levora Dredge Entered By: Levora Dredge on 12/28/2021 09:27:49 Laura Santiago ([id number]) -------------------------------------------------------------------------------- HPI Details Patient Name: Laura Santiago. Date of Service: 12/28/2021 9:00 AM Medical Record Number: [id number] Patient Account Number: [id number] Date of Birth/Sex: 1946-11-05 (76 y.o. F) Treating RN: Levora Dredge Primary Care Provider: Priscille Kluver Other Clinician: Referring Provider: Orvis Brill, DOCTORS Treating Provider/Extender: Yaakov Guthrie in Treatment: 25 History of Present Illness HPI Description: Admission 7/27 Laura Santiago is a 76 year old female with a past medical history of ADHD, metastatic breast cancer, stage IV chronic kidney disease, history of DVT  on Xarelto and chronic venous insufficiency that presents to the clinic for a chronic left lower extremity wound. She recently moved to [facility] 4 days ago. She was being followed by wound care center in [name]. She reports a 10-year history of wounds to her left lower extremity that eventually do heal with debridement and compression therapy. She states that the current wound reopened 4 months ago and she is using Vaseline and Coban. She denies signs of infection. 8/3; patient presents for 1 week follow-up. She reports no issues or complaints today. She states she had vascular studies done in the last week. She denies signs of infection. She brought her little service dog with her today. 8/17; patient presents for follow-up. She has missed her last clinic appointment. She states she took the wrap off and attempted to rewrap her leg. She is having difficulty with transportation. She has her service dog with her today. Overall she feels well and reports improvement in wound healing. She denies signs of infection.  She reports owning an old Velcro wrap compression and has this at her living facility 9/14; patient presents for follow-up. Patient states that over the past 2 to 3 weeks she developed toe wounds to her right foot. She attributes this to tight fitting shoes. She subsequently developed cellulitis in the right leg and has been treated by doxycycline by her oncologist. She reports improvement in symptoms however continues to have some redness and swelling to this leg. To the left lower extremity patient has been having her wraps changed with home health twice weekly. She states that the [facility] is not helping control the drainage. Other than that she has no issues or complaints today. She denies signs of infection to the left lower extremity. 9/21; patient presents for follow-up. She reports seeing infectious disease for her cellulitis. She reports no further management. She has home health that changes the wraps twice weekly. She has no issues or complaints today. She denies signs of infection. 10/5; patient presents for follow-up. She has no issues or complaints today. She denies signs of infection. She states that the right great toe has not been dressed by home health. 10/12; patient presents for follow-up. She has no issues or complaints today. She reports improvement in her wound healing. She has been using silver alginate to the right great toe wound. She denies signs of infection. 10/26; patient presents for follow-up. Home health did not have sorbact so they continued to use Hydrofera Blue under the wrap. She has been using silver alginate to the great toe wound however she did not have a dressing in place today. She currently denies signs of infection. 11/2; patient presents for follow-up. She has been using sorb act under the compression wrap. She reports using silver alginate to the toe wound again she does not have a dressing in place. She currently denies signs of infection. 11/23;  patient presents for follow-up. Unfortunately she has missed her last 2 clinic appointments. She was last seen 3 weeks ago. She did her own compression wrap with Kerlix and Coban yesterday after seeing vein and vascular. She has not been dressing her right great toe wound. She currently denies signs of infection. 11/30; patient presents for 1 week follow-up. She states she changed her dressing last week prior to home health and use sorb act with Dakin's and Hydrofera Blue. Home health has changed the dressing as well and they have been using sorbact. Today she reports increased redness to her right  lower extremity. She has a history of cellulitis to this leg. She has been using silver alginate to the right great toe. Unfortunately she had an episode of diarrhea prior to coming in and had feces all over the right leg and to the wrap of her left leg. 12/7; patient presents for 1 week follow-up. She states that home health did not come out to change the dressing and she took it off yesterday. It is unclear if she is dressing the right toe wound. She denies signs of infection. 12/14; patient presents for 1 week follow-up. She has no issues or complaints today. 12/21; patient presents for follow-up. She has no issues or complaints today. She denies signs of infection. 12/28/2021; patient presents for follow-up. She was hospitalized for sepsis secondary to right lower extremity cellulitis On 12/23. She states she is currently at a SNF. She states that she was started on doxycycline this morning for her right great toe swelling and redness. She is not sure what dressings have been done to her left lower extremity for the past 3 weeks. She says its been mainly gauze with an Ace wrap. Electronic Signature(s) Signed: 12/28/2021 10:33:27 AM By: Kalman Shan DO Entered By: Kalman Shan on 12/28/2021 10:23:17 Laura Santiago  ([id number]) -------------------------------------------------------------------------------- Physical Exam Details Patient Name: Laura Santiago, Laura Santiago. Date of Service: 12/28/2021 9:00 AM Medical Record Number: [id number] Patient Account Number: [id number] Date of Birth/Sex: October 18, 1946 (76 y.o. F) Treating RN: Levora Dredge Primary Care Provider: Priscille Kluver Other Clinician: Referring Provider: HOUSECALLS, DOCTORS Treating Provider/Extender: Yaakov Guthrie in Treatment: 25 Constitutional . Cardiovascular . Psychiatric . Notes Left lower extremity: Large open wound to the medial aspect with nonviable tissue throughout and scant granulation tissue present. There are several islands of green and black discoloration to the wound bed. Right foot: To the right great toe there is an open wound with purulent drainage. The nail is also damaged. Electronic Signature(s) Signed: 12/28/2021 10:33:27 AM By: Kalman Shan DO Entered By: Kalman Shan on 12/28/2021 10:26:04 Laura Santiago ([id number]) -------------------------------------------------------------------------------- Physician Orders Details Patient Name: Laura Santiago. Date of Service: 12/28/2021 9:00 AM Medical Record Number: [id number] Patient Account Number: [id number] Date of Birth/Sex: 1946-09-26 (76 y.o. F) Treating RN: Levora Dredge Primary Care Provider: Orvis Brill, DOCTORS Other Clinician: Referring Provider: Orvis Brill, DOCTORS Treating Provider/Extender: Yaakov Guthrie in Treatment: 25 Verbal / Phone Orders: No Diagnosis Coding Follow-up Appointments o Return Appointment in 1 week. o Nurse Visit as needed [place], not currently due to patient currently in skilled nursing facility o **Please direct any NON-WOUND related issues/requests for orders to patient's Primary Care Physician. **If current dressing causes regression in wound  condition, may D/C ordered dressing product/s and apply Normal Saline Moist Dressing daily until next [place] or Other MD appointment. **Notify [facility] of regression in wound condition at [phone]. Bathing/ Shower/ Hygiene o May shower with wound dressing protected with water repellent cover or cast protector. o No tub bath. Anesthetic (Use 'Patient Medications' Section for Anesthetic Order Entry) o Lidocaine applied to wound bed Edema Control - Lymphedema / Segmental Compressive Device / Other o Elevate leg(s) parallel to the floor when sitting. o DO YOUR BEST to sleep in the bed at night. DO NOT sleep in your recliner. Long hours of sitting in a recliner leads to swelling of the legs and/or potential wounds on your backside. Additional Orders / Instructions o Follow Nutritious Diet and Increase Protein Intake Wound  Treatment Wound #1 - Lower Leg Wound Laterality: Left, Medial Cleanser: Soap and Water (Home Health) (Generic) 2 x Per Day/30 Days Discharge Instructions: Gently cleanse wound with antibacterial soap, rinse and pat dry prior to dressing wounds Topical: Gentamicin 2 x Per Day/30 Days Discharge Instructions: Apply as directed by provider. Today (12/28/21) in office only Primary Dressing: Gauze 2 x Per Day/30 Days Discharge Instructions: As directed: dry, moistened with saline or moistened with Dakins Solution to start 2 x daily 12/29/21 Secondary Dressing: ABD Pad 5x9 (in/in) 2 x Per Day/30 Days Discharge Instructions: Cover with ABD pad Secured With: 58M ACE Elastic Bandage With VELCRO Brand Closure, 4 (in) 2 x Per Day/30 Days Wound #2 - Toe Great Wound Laterality: Right Cleanser: Wound Cleanser (Home Health) (Generic) 3 x Per Week/30 Days Discharge Instructions: Wash your hands with soap and water. Remove old dressing, discard into plastic bag and place into trash. Cleanse the wound with Wound Cleanser prior to applying a clean dressing  using gauze sponges, not tissues or cotton balls. Do not scrub or use excessive force. Pat dry using gauze sponges, not tissue or cotton balls. Primary Dressing: Silvercel Small 2x2 (in/in) (Home Health) (Generic) 3 x Per Week/30 Days Discharge Instructions: Apply Silvercel Small 2x2 (in/in) as instructed Secondary Dressing: Gauze (Home Health) (Generic) 3 x Per Week/30 Days Discharge Instructions: Cover with dry gauze Secured With: 58M Medipore H Soft Cloth Surgical Tape, 2x2 (in/yd) ([place]) (Generic) 3 x Per Week/30 Days Discharge Instructions: Secure dressing Laura Santiago, Laura Santiago ([id number]) Laboratory o Bacteria identified in Wound by Culture (MICRO) - Culture completed today on right great toe wound and left lower leg wound oooo LOINC Code: 6462-6 oooo Convenience Name: Wound culture routine Electronic Signature(s) Signed: 12/28/2021 10:33:27 AM By: Kalman Shan DO Entered By: Kalman Shan on 12/28/2021 10:32:56 Laura Santiago ([id number]) -------------------------------------------------------------------------------- Problem List Details Patient Name: Laura Santiago. Date of Service: 12/28/2021 9:00 AM Medical Record Number: [id number] Patient Account Number: [id number] Date of Birth/Sex: Jul 29, 1946 (76 y.o. F) Treating RN: Levora Dredge Primary Care Provider: Orvis Brill, DOCTORS Other Clinician: Referring Provider: Orvis Brill, DOCTORS Treating Provider/Extender: Yaakov Guthrie in Treatment: 25 Active Problems ICD-10 Encounter Code Description Active Date MDM Diagnosis L97.822 Non-pressure chronic ulcer of other part of left lower leg with fat layer 11/02/2021 No Yes exposed I87.312 Chronic venous hypertension (idiopathic) with ulcer of left lower 11/02/2021 No Yes extremity S91.201D Unspecified open wound of right great toe with damage to nail, 08/31/2021 No Yes subsequent encounter I87.2 Venous insufficiency (chronic) (peripheral) 07/06/2021 No  Yes Z79.01 Long term (current) use of anticoagulants 07/06/2021 No Yes I10 Essential (primary) hypertension 07/06/2021 No Yes 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 07/06/2021 No Yes Inactive Problems ICD-10 Code Description Active Date Inactive Date S81.802A Unspecified open wound, left lower leg, initial encounter 07/06/2021 07/06/2021 S91.101A Unspecified open wound of right great toe without damage to nail, initial 08/24/2021 08/24/2021 encounter S91.104A Unspecified open wound of right lesser toe(s) without damage to nail, initial 08/24/2021 08/24/2021 encounter Resolved Problems ICD-10 NEVADA, KIRCHNER ([id number]) Code Description Active Date Resolved Date S91.104D Unspecified open wound of right lesser toe(s) without damage to nail, 08/31/2021 08/31/2021 subsequent encounter Electronic Signature(s) Signed: 12/28/2021 10:33:27 AM By: Kalman Shan DO Entered By: Kalman Shan on 12/28/2021 10:20:52 Laura Santiago ([id number]) -------------------------------------------------------------------------------- Progress Note Details Patient Name: Laura Santiago. Date of Service: 12/28/2021 9:00 AM Medical Record Number: [id number] Patient Account Number: [id number] Date of Birth/Sex: [birth year] (76 y.o. F) Treating RN: Levora Dredge Primary  Care Provider: Orvis Brill, DOCTORS Other Clinician: Referring Provider: Orvis Brill, DOCTORS Treating Provider/Extender: Yaakov Guthrie in Treatment: 25 Subjective Chief Complaint Information obtained from Patient Left lower extremity wound Right toe wounds Left upper lateral thigh wounds History of Present Illness (HPI) Admission 7/27 Ms. Rhyse Skowron is a 76 year old female with a past medical history of ADHD, metastatic breast cancer, stage IV chronic kidney disease, history of DVT on Xarelto and chronic venous insufficiency that presents to the clinic for a chronic left lower extremity wound. She recently moved to  [facility] 4 days ago. She was being followed by wound care center in [name]. She reports a 10-year history of wounds to her left lower extremity that eventually do heal with debridement and compression therapy. She states that the current wound reopened 4 months ago and she is using Vaseline and Coban. She denies signs of infection. 8/3; patient presents for 1 week follow-up. She reports no issues or complaints today. She states she had vascular studies done in the last week. She denies signs of infection. She brought her little service dog with her today. 8/17; patient presents for follow-up. She has missed her last clinic appointment. She states she took the wrap off and attempted to rewrap her leg. She is having difficulty with transportation. She has her service dog with her today. Overall she feels well and reports improvement in wound healing. She denies signs of infection. She reports owning an old Velcro wrap compression and has this at her living facility 9/14; patient presents for follow-up. Patient states that over the past 2 to 3 weeks she developed toe wounds to her right foot. She attributes this to tight fitting shoes. She subsequently developed cellulitis in the right leg and has been treated by doxycycline by her oncologist. She reports improvement in symptoms however continues to have some redness and swelling to this leg. To the left lower extremity patient has been having her wraps changed with home health twice weekly. She states that the [facility] is not helping control the drainage. Other than that she has no issues or complaints today. She denies signs of infection to the left lower extremity. 9/21; patient presents for follow-up. She reports seeing infectious disease for her cellulitis. She reports no further management. She has home health that changes the wraps twice weekly. She has no issues or complaints today. She denies signs of infection. 10/5;  patient presents for follow-up. She has no issues or complaints today. She denies signs of infection. She states that the right great toe has not been dressed by home health. 10/12; patient presents for follow-up. She has no issues or complaints today. She reports improvement in her wound healing. She has been using silver alginate to the right great toe wound. She denies signs of infection. 10/26; patient presents for follow-up. Home health did not have sorbact so they continued to use Hydrofera Blue under the wrap. She has been using silver alginate to the great toe wound however she did not have a dressing in place today. She currently denies signs of infection. 11/2; patient presents for follow-up. She has been using sorb act under the compression wrap. She reports using silver alginate to the toe wound again she does not have a dressing in place. She currently denies signs of infection. 11/23; patient presents for follow-up. Unfortunately she has missed her last 2 clinic appointments. She was last seen 3 weeks ago. She did her own compression wrap with Kerlix and Coban yesterday after seeing  vein and vascular. She has not been dressing her right great toe wound. She currently denies signs of infection. 11/30; patient presents for 1 week follow-up. She states she changed her dressing last week prior to home health and use sorb act with Dakin's and Hydrofera Blue. Home health has changed the dressing as well and they have been using sorbact. Today she reports increased redness to her right lower extremity. She has a history of cellulitis to this leg. She has been using silver alginate to the right great toe. Unfortunately she had an episode of diarrhea prior to coming in and had feces all over the right leg and to the wrap of her left leg. 12/7; patient presents for 1 week follow-up. She states that home health did not come out to change the dressing and she took it off yesterday. It is unclear if  she is dressing the right toe wound. She denies signs of infection. 12/14; patient presents for 1 week follow-up. She has no issues or complaints today. 12/21; patient presents for follow-up. She has no issues or complaints today. She denies signs of infection. 12/28/2021; patient presents for follow-up. She was hospitalized for sepsis secondary to right lower extremity cellulitis On 12/23. She states she is currently at a SNF. She states that she was started on doxycycline this morning for her right great toe swelling and redness. She is not sure what dressings have been done to her left lower extremity for the past 3 weeks. She says its been mainly gauze with an Ace wrap. Laura Santiago, Laura Santiago ([id number]) Objective Constitutional Vitals Time Taken: 8:39 AM, Height: 66 in, Weight: 153 lbs, BMI: 24.7, Temperature: 97.9 F, Pulse: 81 bpm, Respiratory Rate: 18 breaths/min, Blood Pressure: 90/58 mmHg. General Notes: pt states asymptomatic. General Notes: Left lower extremity: Large open wound to the medial aspect with nonviable tissue throughout and scant granulation tissue present. There are several islands of green and black discoloration to the wound bed. Right foot: To the right great toe there is an open wound with purulent drainage. The nail is also damaged. Integumentary (Hair, Skin) Wound #1 status is Open. Original cause of wound was Gradually Appeared. The date acquired was: 04/06/2021. The wound has been in treatment 25 weeks. The wound is located on the Left,Medial Lower Leg. The wound measures 9.8cm length x 8cm width x 0.3cm depth; 61.575cm^2 area and 18.473cm^3 volume. There is Fat Layer (Subcutaneous Tissue) exposed. There is no tunneling or undermining noted. There is a large amount of serosanguineous drainage noted. There is medium (34-66%) red, pink granulation within the wound bed. There is a medium (34-66%) amount of necrotic tissue within the wound bed including Adherent  Slough. General Notes: discoloration noted to wound darker areas, pt not seen since 11/30/21 Wound #2 status is Open. Original cause of wound was Gradually Appeared. The date acquired was: 08/24/2021. The wound has been in treatment 18 weeks. The wound is located on the Right Toe Great. The wound measures 1cm length x 2cm width x 0.2cm depth; 1.571cm^2 area and 0.314cm^3 volume. There is Fat Layer (Subcutaneous Tissue) exposed. There is no tunneling noted, however, there is undermining starting at 12:00 and ending at 12:00 with a maximum distance of 0.6cm. There is a medium amount of serous drainage noted. The wound margin is thickened. There is no granulation within the wound bed. There is a medium (34-66%) amount of necrotic tissue within the wound bed including Adherent Slough. Assessment Active Problems ICD-10 Non-pressure chronic ulcer of other  part of left lower leg with fat layer exposed Chronic venous hypertension (idiopathic) with ulcer of left lower extremity Unspecified open wound of right great toe with damage to nail, subsequent encounter Venous insufficiency (chronic) (peripheral) Long term (current) use of anticoagulants Essential (primary) hypertension Secondary malignant neoplasm of breast The patient has not been to the clinic in the past 4 weeks due to being hospitalized for sepsis secondary to right lower extremity cellulitis. The left lower extremity wound bed does not appear healthy. There are darkened areas throughout the wound bed concerning for Pseudomonas. I obtained a wound culture. I recommended Dakin's wet-to-dry dressings daily. She has good swelling control since she has been elevating her legs more often. Her right toe appears infected. She states she was started on doxycycline today. Due to drainage on exam I obtained a wound culture. The nail is also damaged. This is likely the source of the worsening infection. She states she is scheduled to see a podiatrist.  I recommended silver alginate to the toe. Follow-up in 1 week. 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 There was a Selective/Open Wound Non-Viable Tissue Debridement with a total area of 78.4 sq cm performed by Kalman Shan, MD. With the following instrument(s): Curette to remove Viable and Non-Viable tissue/material. Material removed includes Slough and Biofilm and. No specimens were taken. A time out was conducted at 09:23, prior to the start of the procedure. A Minimum amount of bleeding was controlled with Pressure. The procedure was tolerated well. Post Debridement Measurements: 9.8cm length x 8cm width x 0.3cm depth; 18.473cm^3 volume. Character of Wound/Ulcer Post Debridement is stable. Severity of Tissue Post Debridement is: Fat layer exposed. 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 There was a Selective/Open Wound Skin/Dermis Debridement with a total area of 2 sq cm performed by Kalman Shan, MD. With the following instrument(s): Curette to remove Laura Santiago, Laura Santiago. ([id number]) Viable and Non-Viable tissue/material. Material removed includes Skin: Dermis. 1 specimen was taken by a Swab and sent to the lab per facility protocol. A time out was conducted at 09:19, prior to the start of the procedure. A Minimum amount of bleeding was controlled with Pressure. The procedure was tolerated well. Post Debridement Measurements: 1cm length x 2cm width x 0.2cm depth; 0.314cm^3 volume. Character of Wound/Ulcer Post Debridement is stable. Post procedure Diagnosis Wound #2: Same as Pre-Procedure Plan Follow-up Appointments: Return Appointment in 1 week. Nurse Visit as needed Home Health: [facility], not  currently due to patient currently in skilled nursing facility **Please direct any NON-WOUND related issues/requests for orders to patient's Primary Care Physician. **If current dressing causes regression in wound condition, may D/C ordered dressing product/s and apply Normal Saline Moist Dressing daily until next [facility] or Other MD appointment. **Notify [facility] of regression in wound condition at [phone]. Bathing/ Shower/ Hygiene: May shower with wound dressing protected with water repellent cover or cast protector. No tub bath. Anesthetic (Use 'Patient Medications' Section for Anesthetic Order Entry): Lidocaine applied to wound bed Edema Control - Lymphedema / Segmental Compressive Device / Other: Elevate leg(s) parallel to the floor when sitting. DO YOUR BEST to sleep in the bed at night. DO NOT sleep in your recliner. Long hours of sitting in  a recliner leads to swelling of the legs and/or potential wounds on your backside. Additional Orders / Instructions: Follow Nutritious Diet and Increase Protein Intake Laboratory ordered were: Wound culture routine - Culture completed today on right great toe wound and left lower leg wound WOUND #1: - Lower Leg Wound Laterality: Left, Medial Cleanser: Soap and Water (Home Health) (Generic) 2 x Per Day/30 Days Discharge Instructions: Gently cleanse wound with antibacterial soap, rinse and pat dry prior to dressing wounds Topical: Gentamicin 2 x Per Day/30 Days Discharge Instructions: Apply as directed by provider. Today (12/28/21) in office only Primary Dressing: Gauze 2 x Per Day/30 Days Discharge Instructions: As directed: dry, moistened with saline or moistened with Dakins Solution to start 2 x daily 12/29/21 Secondary Dressing: ABD Pad 5x9 (in/in) 2 x Per Day/30 Days Discharge Instructions: Cover with ABD pad Secured With: 61M ACE Elastic Bandage With VELCRO Brand Closure, 4 (in) 2 x Per Day/30 Days WOUND #2: - Toe  Great Wound Laterality: Right Cleanser: Wound Cleanser (Home Health) (Generic) 3 x Per Week/30 Days Discharge Instructions: Wash your hands with soap and water. Remove old dressing, discard into plastic bag and place into trash. Cleanse the wound with Wound Cleanser prior to applying a clean dressing using gauze sponges, not tissues or cotton balls. Do not scrub or use excessive force. Pat dry using gauze sponges, not tissue or cotton balls. Primary Dressing: Silvercel Small 2x2 (in/in) (Home Health) (Generic) 3 x Per Week/30 Days Discharge Instructions: Apply Silvercel Small 2x2 (in/in) as instructed Secondary Dressing: Gauze (Home Health) (Generic) 3 x Per Week/30 Days Discharge Instructions: Cover with dry gauze Secured With: 61M Medipore H Soft Cloth Surgical Tape, 2x2 (in/yd) ([facility]) (Generic) 3 x Per Week/30 Days Discharge Instructions: Secure dressing 1. Dakin's wet-to-dry 2. Silver alginate 3. Wound culture 4. Follow-up in 1 week Electronic Signature(s) Signed: 12/28/2021 10:33:27 AM By: Kalman Shan DO Entered By: Kalman Shan on 12/28/2021 10:31:51 Laura Santiago ([id number]) -------------------------------------------------------------------------------- SuperBill Details Patient Name: Laura Santiago. Date of Service: 12/28/2021 Medical Record Number: [id number] Patient Account Number: [id number] Date of Birth/Sex: [birth year] (76 y.o. 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) Z79.01 Long term (current) use of anticoagulants I10 Essential (primary) hypertension 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 Facility Procedures CPT4 Code: [id number] Description: 76160 - DEBRIDE WOUND 1ST 20 SQ CM OR < Modifier: Quantity: 1 CPT4 Code: Description: ICD-10 Diagnosis Description S91.201D Unspecified open wound of right great toe with damage to nail, subsequent e Modifier: ncounter Quantity: CPT4 Code: 73710626 Description: 94854 - DEBRIDE WOUND EA ADDL 20 SQ CM Modifier: Quantity: 4 CPT4 Code: Description: ICD-10 Diagnosis Description L97.822 Non-pressure chronic ulcer of other part of left lower leg with fat layer e Modifier: xposed Quantity: Physician Procedures CPT4 Code: 6270350 Description: 99214 - WC PHYS LEVEL 4 - EST PT Modifier: Quantity: 1 CPT4 Code: Description: ICD-10 Diagnosis Description L97.822 Non-pressure chronic ulcer of other part of left lower leg with fat layer ex 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 S91.201D Unspecified open wound of right  great toe with damage to  nail, subsequent en I87.2 Venous insufficiency (chronic) (peripheral) Modifier: posed counter Quantity: CPT4 Code: 5625638 Description: 93734 - WC PHYS DEBR WO ANESTH 20 SQ CM Modifier: Quantity: 1 CPT4 Code: Description: ICD-10 Diagnosis Description K87.681L Unspecified open wound of right great toe with damage to nail, subsequent en Modifier: counter Quantity: CPT4 Code: 5726203 Description: 55974 - WC PHYS DEBR WO ANESTH EA ADD 20 CM Modifier: Quantity: 4 CPT4 Code: Description: ICD-10 Diagnosis Description L97.822 Non-pressure chronic ulcer of other part of left lower leg with fat layer ex Modifier: posed Quantity: Electronic Signature(s) Signed: 12/28/2021 10:33:27 AM By: Kalman Shan DO Entered By: Kalman Shan on 12/28/2021 10:32:26

## 2021-12-28 NOTE — Progress Notes (Signed)
Laura, Santiago ([id number]) Visit Report for 12/28/2021 Arrival Information Details Patient Name: Laura Santiago, Laura Santiago. Date of Service: 12/28/2021 9:00 AM Medical Record Number: [id number] Patient Account Number: [id number] Date of Birth/Sex: 03/07/1946 (76 y.o. F) Treating RN: Levora Dredge Primary Care Diquan Kassis: Orvis Brill, DOCTORS Other Clinician: Referring Margaretta Chittum: HOUSECALLS, DOCTORS Treating Deyja Sochacki/Extender: Yaakov Guthrie in Treatment: 25 Visit Information History Since Last Visit Added or deleted any medications: Yes Patient Arrived: Walker Any new allergies or adverse reactions: No Arrival Time: 08:36 Had a fall or experienced change in No Accompanied By: self activities of daily living that may affect Transfer Assistance: None risk of falls: Patient Identification Verified: Yes Hospitalized since last visit: Yes Secondary Verification Process Completed: Yes Pain Present Now: No Patient Has Alerts: Yes Patient Alerts: PT HAS SERVICE ANIMAL ABI 07/11/21 R) 1.16 L) 1.27 Electronic Signature(s) Signed: 12/28/2021 4:56:07 PM By: Levora Dredge Entered By: Levora Dredge on 12/28/2021 08:39:10 Laura Santiago ([id number]) -------------------------------------------------------------------------------- Encounter Discharge Information Details Patient Name: Laura Santiago. Date of Service: 12/28/2021 9:00 AM Medical Record Number: [id number] Patient Account Number: [id number] Date of Birth/Sex: 11-24-1946 (77 y.o. F) Treating RN: Levora Dredge Primary Care Arye Weyenberg: Orvis Brill, DOCTORS Other Clinician: Referring [place] Mcphee: Orvis Brill, DOCTORS Treating Khrystina Bonnes/Extender: Yaakov Guthrie in Treatment: 25 Encounter Discharge Information Items Post Procedure Vitals Discharge Condition: Stable Temperature (F): 97.9 Ambulatory Status: Walker Pulse (bpm): 81 Discharge Destination: Home Respiratory Rate (breaths/min): 18 Transportation: Other Blood  Pressure (mmHg): 90/58 Accompanied By: self/staff Schedule Follow-up Appointment: Yes Clinical Summary of Care: Electronic Signature(s) Signed: 12/28/2021 11:11:52 AM By: Levora Dredge Entered By: Levora Dredge on 12/28/2021 11:11:52 Laura Santiago ([id number]) -------------------------------------------------------------------------------- Lower Extremity Assessment Details Patient Name: Laura Santiago. Date of Service: 12/28/2021 9:00 AM Medical Record Number: [id number] Patient Account Number: [id number] Date of Birth/Sex: May 14, 1946 (76 y.o. F) Treating RN: Levora Dredge Primary Care Nayali Talerico: Orvis Brill, DOCTORS Other Clinician: Referring Shelbylynn Walczyk: HOUSECALLS, DOCTORS Treating Karyna Bessler/Extender: Yaakov Guthrie in Treatment: 25 Edema Assessment Assessed: [Left: No] [Right: No] [Left: Edema] [Right: :] Calf Left: Right: Point of Measurement: 29 cm From Medial Instep 31 cm 35.5 cm Ankle Left: Right: Point of Measurement: 10 cm From Medial Instep 22.9 cm 23 cm Vascular Assessment Pulses: Dorsalis Pedis Palpable: [Left:Yes] [Right:Yes] Posterior Tibial Palpable: [Right:Yes] Electronic Signature(s) Signed: 12/28/2021 4:56:07 PM By: Levora Dredge Entered By: Levora Dredge on 12/28/2021 09:03:43 Laura Santiago ([id number]) -------------------------------------------------------------------------------- Multi Wound Chart Details Patient Name: Laura Santiago. Date of Service: 12/28/2021 9:00 AM Medical Record Number: [id number] Patient Account Number: [id number] Date of Birth/Sex: 01/07/1946 (76 y.o. F) Treating RN: Levora Dredge Primary Care Massie Mees: Orvis Brill, DOCTORS Other Clinician: Referring Marchell Froman: HOUSECALLS, DOCTORS Treating Jarrius Huaracha/Extender: Yaakov Guthrie in Treatment: 25 Vital Signs Height(in): 27 Pulse(bpm): 68 Weight(lbs): 153 Blood Pressure(mmHg): 90/58 Body Mass Index(BMI): 25 Temperature(F): 97.9 Respiratory  Rate(breaths/min): 18 Photos: [N/A:N/A] Wound Location: Left, Medial Lower Leg Right Toe Great N/A Wounding Event: Gradually Appeared Gradually Appeared N/A Primary Etiology: Venous Leg Ulcer Neuropathic Ulcer-Non Diabetic N/A Comorbid History: Hypertension, Osteoarthritis, Hypertension, Osteoarthritis, N/A Received Chemotherapy, Received Received Chemotherapy, Received Radiation Radiation Date Acquired: 04/06/2021 08/24/2021 N/A Weeks of Treatment: 25 18 N/A Wound Status: Open Open N/A Clustered Wound: Yes No N/A Measurements L x W x D (cm) 9.8x8x0.3 1x2x0.2 N/A Area (cm) : 61.575 1.571 N/A Volume (cm) : 18.473 0.314 N/A % Reduction in Area: 31.80% -900.60% N/A % Reduction in Volume: -2.30% -398.40% N/A Starting Position 1 (o'clock): 12 Ending Position 1 (o'clock): 12 Maximum Distance 1 (cm): 0.6 Undermining: No  Yes N/A Classification: Full Thickness Without Exposed Full Thickness Without Exposed N/A Support Structures Support Structures Exudate Amount: Large Medium N/A Exudate Type: Serosanguineous Serous N/A Exudate Color: red, brown amber N/A Wound Margin: N/A Thickened N/A Granulation Amount: Medium (34-66%) None Present (0%) N/A Granulation Quality: Red, Pink N/A N/A Necrotic Amount: Medium (34-66%) Medium (34-66%) N/A Exposed Structures: Fat Layer (Subcutaneous Tissue): Fat Layer (Subcutaneous Tissue): N/A Yes Yes Fascia: No Fascia: No Tendon: No Tendon: No Muscle: No Muscle: No Joint: No Joint: No Bone: No Bone: No Epithelialization: None None N/A Assessment Notes: discoloration noted to wound darker N/A N/A areas, pt not seen since 11/30/21 Treatment Notes ELIAS, BORDNER ([id number]) Electronic Signature(s) Signed: 12/28/2021 4:56:07 PM By: Levora Dredge Entered By: Levora Dredge on 12/28/2021 09:18:05 Laura Santiago ([id number]) -------------------------------------------------------------------------------- [place]  Details Patient Name: Laura Santiago. Date of Service: 12/28/2021 9:00 AM Medical Record Number: [id number] Patient Account Number: [id number] Date of Birth/Sex: 07/26/1946 (76 y.o. F) Treating RN: Levora Dredge Primary Care Jovanka Westgate: Orvis Brill, DOCTORS Other Clinician: Referring Goldye Tourangeau: Orvis Brill, DOCTORS Treating Zackary Mckeone/Extender: Yaakov Guthrie in Treatment: 25 Active Inactive Wound/Skin Impairment Nursing Diagnoses: Impaired tissue integrity Goals: Patient/caregiver will verbalize understanding of skin care regimen Date Initiated: 07/06/2021 Date Inactivated: 07/27/2021 Target Resolution Date: 07/06/2021 Goal Status: Met Ulcer/skin breakdown will have a volume reduction of 30% by week 4 Date Initiated: 07/06/2021 Date Inactivated: 10/12/2021 Target Resolution Date: 08/06/2021 Goal Status: Unmet Unmet Reason: cont tx Ulcer/skin breakdown will have a volume reduction of 50% by week 8 Date Initiated: 07/06/2021 Target Resolution Date: 09/06/2021 Goal Status: Active Ulcer/skin breakdown will have a volume reduction of 80% by week 12 Date Initiated: 07/06/2021 Target Resolution Date: 10/06/2021 Goal Status: Active Ulcer/skin breakdown will heal within 14 weeks Date Initiated: 07/06/2021 Target Resolution Date: 11/06/2021 Goal Status: Active Interventions: Assess patient/caregiver ability to obtain necessary supplies Assess patient/caregiver ability to perform ulcer/skin care regimen upon admission and as needed Assess ulceration(s) every visit Treatment Activities: Referred to DME Candise Crabtree for dressing supplies : 07/06/2021 Skin care regimen initiated : 07/06/2021 Notes: Electronic Signature(s) Signed: 12/28/2021 4:56:07 PM By: Levora Dredge Entered By: Levora Dredge on 12/28/2021 09:16:16 Laura Santiago ([id number]) -------------------------------------------------------------------------------- Pain Assessment Details Patient Name: Laura Santiago. Date of Service: 12/28/2021 9:00 AM Medical Record Number: [id number] Patient Account Number: [id number] Date of Birth/Sex: 30-May-1946 (76 y.o. F) Treating RN: Levora Dredge Primary Care Naiyah Klostermann: Orvis Brill, DOCTORS Other Clinician: Referring Cedar Roseman: Orvis Brill, DOCTORS Treating Sheng Pritz/Extender: Yaakov Guthrie in Treatment: 25 Active Problems Location of Pain Severity and Description of Pain Patient Has Paino No Site Locations Rate the pain. Current Pain Level: 0 Pain Management and Medication Current Pain Management: Electronic Signature(s) Signed: 12/28/2021 4:56:07 PM By: Levora Dredge Entered By: Levora Dredge on 12/28/2021 08:43:34 DARRELLE, BARRELL ([id number]) -------------------------------------------------------------------------------- Patient/Caregiver Education Details Patient Name: Laura Santiago. Date of Service: 12/28/2021 9:00 AM Medical Record Number: [id number] Patient Account Number: [id number] Date of Birth/Gender: 1946-07-14 (76 y.o. F) Treating RN: Levora Dredge Primary Care Physician: Orvis Brill, DOCTORS Other Clinician: Referring Physician: Orvis Brill, DOCTORS Treating Physician/Extender: Yaakov Guthrie in Treatment: 25 Education Assessment Education Provided To: Patient Education Topics Provided Wound/Skin Impairment: Handouts: Caring for Your Ulcer Methods: Explain/Verbal Responses: State content correctly Electronic Signature(s) Signed: 12/28/2021 4:56:07 PM By: Levora Dredge Entered By: Levora Dredge on 12/28/2021 11:10:25 Laura Santiago ([id number]) -------------------------------------------------------------------------------- Wound Assessment Details Patient Name: Laura Santiago. Date of Service: 12/28/2021 9:00 AM Medical Record Number: [id number] Patient Account Number: [id number] Date of Birth/Sex:  02/14/1946 (76 y.o. F) Treating RN: Levora Dredge Primary Care Ridley Schewe: Orvis Brill, DOCTORS Other  Clinician: Referring Cal[place] Leap: HOUSECALLS, DOCTORS Treating Triva Hueber/Extender: Yaakov Guthrie in Treatment: 25 Wound Status Wound Number: 1 Primary Venous Leg Ulcer Etiology: Wound Location: Left, Medial Lower Leg Wound Status: Open Wounding Event: Gradually Appeared Comorbid Hypertension, Osteoarthritis, Received Chemotherapy, Date Acquired: 04/06/2021 History: Received Radiation Weeks Of Treatment: 25 Clustered Wound: Yes Photos Wound Measurements Length: (cm) 9.8 Width: (cm) 8 Depth: (cm) 0.3 Area: (cm) 61.575 Volume: (cm) 18.473 % Reduction in Area: 31.8% % Reduction in Volume: -2.3% Epithelialization: None Tunneling: No Undermining: No Wound Description Classification: Full Thickness Without Exposed Support Structu Exudate Amount: Large Exudate Type: Serosanguineous Exudate Color: red, brown res Foul Odor After Cleansing: No Slough/Fibrino Yes Wound Bed Granulation Amount: Medium (34-66%) Exposed Structure Granulation Quality: Red, Pink Fascia Exposed: No Necrotic Amount: Medium (34-66%) Fat Layer (Subcutaneous Tissue) Exposed: Yes Necrotic Quality: Adherent Slough Tendon Exposed: No Muscle Exposed: No Joint Exposed: No Bone Exposed: No Assessment Notes discoloration noted to wound darker areas, pt not seen since 11/30/21 Treatment Notes Wound #1 (Lower Leg) Wound Laterality: Left, Medial Cleanser Soap and Water COURTNY, BENNISON ([id number]) Discharge Instruction: Gently cleanse wound with antibacterial soap, rinse and pat dry prior to dressing wounds Peri-Wound Care Topical Gentamicin Discharge Instruction: Apply as directed by Staley Budzinski. Today (12/28/21) in office only Primary Dressing Gauze Discharge Instruction: As directed: dry, moistened with saline or moistened with Dakins Solution to start 2 x daily 12/29/21 Secondary Dressing ABD Pad 5x9 (in/in) Discharge Instruction: Cover with ABD pad Secured With 47M ACE Elastic Bandage With  VELCRO Brand Closure, 4 (in) Compression Wrap Compression Stockings Add-Ons Electronic Signature(s) Signed: 12/28/2021 4:56:07 PM By: Levora Dredge Entered By: Levora Dredge on 12/28/2021 09:00:51 Laura Santiago ([id number]) -------------------------------------------------------------------------------- Wound Assessment Details Patient Name: Laura Santiago. Date of Service: 12/28/2021 9:00 AM Medical Record Number: [id number] Patient Account Number: [id number] Date of Birth/Sex: [birth year] (76 y.o. F) Treating RN: Levora Dredge Primary Care Dakarri Kessinger: Orvis Brill, DOCTORS Other Clinician: Referring Piper Hassebrock: HOUSECALLS, DOCTORS Treating Carleta Woodrow/Extender: Yaakov Guthrie in Treatment: 25 Wound Status Wound Number: 2 Primary Neuropathic Ulcer-Non Diabetic Etiology: Wound Location: Right Toe Great Wound Status: Open Wounding Event: Gradually Appeared Comorbid Hypertension, Osteoarthritis, Received Chemotherapy, Date Acquired: 08/24/2021 History: Received Radiation Weeks Of Treatment: 18 Clustered Wound: No Photos Wound Measurements Length: (cm) 1 Width: (cm) 2 Depth: (cm) 0.2 Area: (cm) 1.571 Volume: (cm) 0.314 % Reduction in Area: -900.6% % Reduction in Volume: -398.4% Epithelialization: None Tunneling: No Undermining: Yes Starting Position (o'clock): 12 Ending Position (o'clock): 12 Maximum Distance: (cm) 0.6 Wound Description Classification: Full Thickness Without Exposed Support Structu Wound Margin: Thickened Exudate Amount: Medium Exudate Type: Serous Exudate Color: amber res Foul Odor After Cleansing: No Slough/Fibrino Yes Wound Bed Granulation Amount: None Present (0%) Exposed Structure Necrotic Amount: Medium (34-66%) Fascia Exposed: No Necrotic Quality: Adherent Slough Fat Layer (Subcutaneous Tissue) Exposed: Yes Tendon Exposed: No Muscle Exposed: No Joint Exposed: No Bone Exposed: No Treatment Notes Wound #2 (Toe Great) Wound  Laterality: Right Cleanser SHARLIE, SHREFFLER ([id number]) Wound Cleanser Discharge Instruction: Wash your hands with soap and water. Remove old dressing, discard into plastic bag and place into trash. Cleanse the wound with Wound Cleanser prior to applying a clean dressing using gauze sponges, not tissues or cotton balls. Do not scrub or use excessive force. Pat dry using gauze sponges, not tissue or cotton balls. Peri-Wound Care Topical Primary Dressing Silvercel Small 2x2 (in/in) Discharge Instruction: Apply Silvercel Small  2x2 (in/in) as instructed Secondary Dressing Gauze Discharge Instruction: Cover with dry gauze Secured With 27M [employer] Surgical Tape, 2x2 (in/yd) Discharge Instruction: Secure dressing Compression Wrap Compression Stockings Add-Ons Electronic Signature(s) Signed: 12/28/2021 4:56:07 PM By: Levora Dredge Entered By: Levora Dredge on 12/28/2021 09:02:00 Laura Santiago ([id number]) -------------------------------------------------------------------------------- Vitals Details Patient Name: Laura Santiago. Date of Service: 12/28/2021 9:00 AM Medical Record Number: [id number] Patient Account Number: [id number] Date of Birth/Sex: 22-Aug-1946 (76 y.o. F) Treating RN: Levora Dredge Primary Care Joory Gough: Orvis Brill, DOCTORS Other Clinician: Referring Fannye Myer: HOUSECALLS, DOCTORS Treating Aaiden Depoy/Extender: Yaakov Guthrie in Treatment: 25 Vital Signs Time Taken: 08:39 Temperature (F): 97.9 Height (in): 66 Pulse (bpm): 81 Weight (lbs): 153 Respiratory Rate (breaths/min): 18 Body Mass Index (BMI): 24.7 Blood Pressure (mmHg): 90/58 Reference Range: 80 - 120 mg / dl Notes pt states asymptomatic. Electronic Signature(s) Signed: 12/28/2021 4:56:07 PM By: Levora Dredge Entered By: Levora Dredge on 12/28/2021 08:43:12

## 2021-12-31 LAB — AEROBIC CULTURE W GRAM STAIN (SUPERFICIAL SPECIMEN)

## 2022-01-02 ENCOUNTER — Ambulatory Visit: Payer: Medicare Other | Admitting: Radiation Oncology

## 2022-01-02 LAB — AEROBIC CULTURE W GRAM STAIN (SUPERFICIAL SPECIMEN)

## 2022-01-03 ENCOUNTER — Encounter: Payer: Self-pay | Admitting: Oncology

## 2022-01-03 ENCOUNTER — Ambulatory Visit
Admission: RE | Admit: 2022-01-03 | Discharge: 2022-01-03 | Disposition: A | Discharge status: Home / Self Care | Payer: Medicare Other | Source: Ambulatory Visit | Attending: Radiation Oncology | Admitting: Radiation Oncology

## 2022-01-03 ENCOUNTER — Inpatient Hospital Stay: Payer: Medicare Other

## 2022-01-03 ENCOUNTER — Other Ambulatory Visit: Payer: Self-pay

## 2022-01-03 ENCOUNTER — Inpatient Hospital Stay (HOSPITAL_BASED_OUTPATIENT_CLINIC_OR_DEPARTMENT_OTHER): Payer: Medicare Other | Admitting: Oncology

## 2022-01-03 VITALS — BP 114/90 | HR 71 | Temp 99.4°F | Resp 16 | Ht 62.0 in | Wt 143.4 lb

## 2022-01-03 DIAGNOSIS — Z7983 Long term (current) use of bisphosphonates: Secondary | ICD-10-CM

## 2022-01-03 DIAGNOSIS — C779 Secondary and unspecified malignant neoplasm of lymph node, unspecified: Secondary | ICD-10-CM

## 2022-01-03 DIAGNOSIS — R7989 Other specified abnormal findings of blood chemistry: Secondary | ICD-10-CM

## 2022-01-03 DIAGNOSIS — Z808 Family history of malignant neoplasm of other organs or systems: Secondary | ICD-10-CM

## 2022-01-03 DIAGNOSIS — C7951 Secondary malignant neoplasm of bone: Secondary | ICD-10-CM | POA: Insufficient documentation

## 2022-01-03 DIAGNOSIS — G620 Drug-induced polyneuropathy: Secondary | ICD-10-CM

## 2022-01-03 DIAGNOSIS — C50511 Malignant neoplasm of lower-outer quadrant of right female breast: Secondary | ICD-10-CM | POA: Insufficient documentation

## 2022-01-03 DIAGNOSIS — C50919 Malignant neoplasm of unspecified site of unspecified female breast: Secondary | ICD-10-CM

## 2022-01-03 DIAGNOSIS — Z8042 Family history of malignant neoplasm of prostate: Secondary | ICD-10-CM

## 2022-01-03 DIAGNOSIS — Z5111 Encounter for antineoplastic chemotherapy: Secondary | ICD-10-CM | POA: Diagnosis not present

## 2022-01-03 DIAGNOSIS — Z5112 Encounter for antineoplastic immunotherapy: Secondary | ICD-10-CM

## 2022-01-03 DIAGNOSIS — T451X5A Adverse effect of antineoplastic and immunosuppressive drugs, initial encounter: Secondary | ICD-10-CM

## 2022-01-03 DIAGNOSIS — I10 Essential (primary) hypertension: Secondary | ICD-10-CM

## 2022-01-03 LAB — CBC WITH DIFFERENTIAL/PLATELET
Abs Immature Granulocytes: 0.06 10*3/uL (ref 0.00–0.07)
Basophils Absolute: 0 10*3/uL (ref 0.0–0.1)
Basophils Relative: 1 %
Eosinophils Absolute: 0.1 10*3/uL (ref 0.0–0.5)
Eosinophils Relative: 2 %
HCT: 31.3 % — ABNORMAL LOW (ref 36.0–46.0)
Hemoglobin: 9.6 g/dL — ABNORMAL LOW (ref 12.0–15.0)
Immature Granulocytes: 2 %
Lymphocytes Relative: 22 %
Lymphs Abs: 0.9 10*3/uL (ref 0.7–4.0)
MCH: 28.2 pg (ref 26.0–34.0)
MCHC: 30.7 g/dL (ref 30.0–36.0)
MCV: 91.8 fL (ref 80.0–100.0)
Monocytes Absolute: 0.4 10*3/uL (ref 0.1–1.0)
Monocytes Relative: 11 %
Neutro Abs: 2.6 10*3/uL (ref 1.7–7.7)
Neutrophils Relative %: 62 %
Platelets: 242 10*3/uL (ref 150–400)
RBC: 3.41 MIL/uL — ABNORMAL LOW (ref 3.87–5.11)
RDW: 17.4 % — ABNORMAL HIGH (ref 11.5–15.5)
WBC: 4.1 10*3/uL (ref 4.0–10.5)
nRBC: 0 % (ref 0.0–0.2)

## 2022-01-03 LAB — COMPREHENSIVE METABOLIC PANEL
ALT: 13 U/L (ref 0–44)
AST: 20 U/L (ref 15–41)
Albumin: 3.5 g/dL (ref 3.5–5.0)
Alkaline Phosphatase: 71 U/L (ref 38–126)
Anion gap: 6 (ref 5–15)
BUN: 30 mg/dL — ABNORMAL HIGH (ref 8–23)
CO2: 26 mmol/L (ref 22–32)
Calcium: 8.6 mg/dL — ABNORMAL LOW (ref 8.9–10.3)
Chloride: 105 mmol/L (ref 98–111)
Creatinine, Ser: 1.61 mg/dL — ABNORMAL HIGH (ref 0.44–1.00)
GFR, Estimated: 33 mL/min — ABNORMAL LOW (ref >60)
Glucose, Bld: 92 mg/dL (ref 70–99)
Potassium: 4.3 mmol/L (ref 3.5–5.1)
Sodium: 137 mmol/L (ref 135–145)
Total Bilirubin: 0.5 mg/dL (ref 0.3–1.2)
Total Protein: 6.8 g/dL (ref 6.5–8.1)

## 2022-01-03 MED ORDER — ZOLEDRONIC ACID 4 MG/5ML IV CONC
3.0000 mg | INTRAVENOUS | Status: DC
  Administered 2022-01-03: 10:00:00 3 mg via INTRAVENOUS
  Filled 2022-01-03: qty 3.75

## 2022-01-03 MED ORDER — ACETAMINOPHEN 325 MG PO TABS
650.0000 mg | ORAL_TABLET | Freq: Once | ORAL | Status: AC
  Administered 2022-01-03: 10:00:00 650 mg via ORAL
  Filled 2022-01-03: qty 2

## 2022-01-03 MED ORDER — TRASTUZUMAB-DKST CHEMO 150 MG IV SOLR
6.0000 mg/kg | Freq: Once | INTRAVENOUS | Status: AC
  Administered 2022-01-03: 11:00:00 420 mg via INTRAVENOUS
  Filled 2022-01-03: qty 20

## 2022-01-03 MED ORDER — SODIUM CHLORIDE 0.9 % IV SOLN
420.0000 mg | Freq: Once | INTRAVENOUS | Status: AC
  Administered 2022-01-03: 11:00:00 420 mg via INTRAVENOUS
  Filled 2022-01-03: qty 14

## 2022-01-03 MED ORDER — SODIUM CHLORIDE 0.9% FLUSH
10.0000 mL | INTRAVENOUS | Status: DC | PRN
  Filled 2022-01-03: qty 10

## 2022-01-03 MED ORDER — SODIUM CHLORIDE 0.9 % IV SOLN
Freq: Once | INTRAVENOUS | Status: AC
  Filled 2022-01-03: qty 250

## 2022-01-03 MED ORDER — DIPHENHYDRAMINE HCL 25 MG PO CAPS
50.0000 mg | ORAL_CAPSULE | Freq: Once | ORAL | Status: AC
  Administered 2022-01-03: 10:00:00 50 mg via ORAL
  Filled 2022-01-03: qty 2

## 2022-01-03 MED ORDER — ALTEPLASE 2 MG IJ SOLR
2.0000 mg | Freq: Once | INTRAMUSCULAR | Status: DC | PRN
  Filled 2022-01-03: qty 2

## 2022-01-03 MED ORDER — HEPARIN SOD (PORK) LOCK FLUSH 100 UNIT/ML IV SOLN
500.0000 [IU] | Freq: Once | INTRAVENOUS | Status: AC | PRN
  Administered 2022-01-03: 13:00:00 500 [IU]
  Filled 2022-01-03: qty 5

## 2022-01-03 NOTE — Patient Instructions (Signed)
[facility]  Discharge Instructions: ?Thank you for choosing [facility] to provide your oncology and hematology care.  ?If you have a lab appointment with the Cancer Center, please go directly to the Cancer Center and check in at the registration area. ? ?Wear comfortable clothing and clothing appropriate for easy access to any Portacath or PICC line.  ? ?We strive to give you quality time with your provider. You may need to reschedule your appointment if you arrive late (15 or more minutes).  Arriving late affects you and other patients whose appointments are after yours.  Also, if you miss three or more appointments without notifying the office, you may be dismissed from the clinic at the provider?s discretion.    ?  ?For prescription refill requests, have your pharmacy contact our office and allow 72 hours for refills to be completed.   ? ?  ?To help prevent nausea and vomiting after your treatment, we encourage you to take your nausea medication as directed. ? ?BELOW ARE SYMPTOMS THAT SHOULD BE REPORTED IMMEDIATELY: ?*FEVER GREATER THAN 100.4 F (38 ?C) OR HIGHER ?*CHILLS OR SWEATING ?*NAUSEA AND VOMITING THAT IS NOT CONTROLLED WITH YOUR NAUSEA MEDICATION ?*UNUSUAL SHORTNESS OF BREATH ?*UNUSUAL BRUISING OR BLEEDING ?*URINARY PROBLEMS (pain or burning when urinating, or frequent urination) ?*BOWEL PROBLEMS (unusual diarrhea, constipation, pain near the anus) ?TENDERNESS IN MOUTH AND THROAT WITH OR WITHOUT PRESENCE OF ULCERS (sore throat, sores in mouth, or a toothache) ?UNUSUAL RASH, SWELLING OR PAIN  ?UNUSUAL VAGINAL DISCHARGE OR ITCHING  ? ?Items with * indicate a potential emergency and should be followed up as soon as possible or go to the Emergency Department if any problems should occur. ? ?Please show the CHEMOTHERAPY ALERT CARD or IMMUNOTHERAPY ALERT CARD at check-in to the Emergency Department and triage nurse. ? ?Should you have questions after your visit  or need to cancel or reschedule your appointment, please contact [facility]  [phone] and follow the prompts.  Office hours are 8:00 a.m. to 4:30 p.m. Monday - Friday. Please note that voicemails left after 4:00 p.m. may not be returned until the following business day.  We are closed weekends and major holidays. You have access to a nurse at all times for urgent questions. Please call the main number to the clinic [phone] and follow the prompts. ? ?For any non-urgent questions, you may also contact your provider using MyChart. We now offer e-Visits for anyone 18 and older to request care online for non-urgent symptoms. For details visit [email or web address]. ?  ?Also download the MyChart app! Go to the app store, search "MyChart", open the app, select [facility], and log in with your MyChart username and password. ? ?Due to Covid, a mask is required upon entering the hospital/clinic. If you do not have a mask, one will be given to you upon arrival. For doctor visits, patients may have 1 support person aged 18 or older with them. For treatment visits, patients cannot have anyone with them due to current Covid guidelines and our immunocompromised population.  ?

## 2022-01-03 NOTE — Progress Notes (Signed)
Radiation Oncology Follow up Note  Name: EVELEN VAZGUEZ   Date:   01/03/2022 MRN:  [id number] DOB: 11-05-1946    This 76 y.o. female presents to the clinic today for 1 month follow-up status post single fraction palliative radiation therapy to right femoral head patient with known stage IV HER2/neu positive breast cancer.  REFERRING PROVIDER: [facility]*  HPI: Patient is a 76 year old female with known stage IV HER2/neu positive breast cancer.  She is now 1 month out from palliative radiation therapy to her right hip.  She developed sepsis of the right knee shortly after her treatments unrelated.  That has cleared she is had excellent palliative response as far as pain is concerned in her right hip..  She is currently on Taxol Herceptin and Perjeta under medical oncology's direction.  COMPLICATIONS OF TREATMENT: none  FOLLOW UP COMPLIANCE: keeps appointments   PHYSICAL EXAM:  There were no vitals taken for this visit. Frail-appearing female in NAD range of motion of her right lower extremity does not elicit pain.  Well-developed well-nourished patient in NAD. HEENT reveals PERLA, EOMI, discs not visualized.  Oral cavity is clear. No oral mucosal lesions are identified. Neck is clear without evidence of cervical or supraclavicular adenopathy. Lungs are clear to A&P. Cardiac examination is essentially unremarkable with regular rate and rhythm without murmur rub or thrill. Abdomen is benign with no organomegaly or masses noted. Motor sensory and DTR levels are equal and symmetric in the upper and lower extremities. Cranial nerves II through XII are grossly intact. Proprioception is intact. No peripheral adenopathy or edema is identified. No motor or sensory levels are noted. Crude visual fields are within normal range.  RADIOLOGY RESULTS: No current films for review  PLAN: Present time will turn follow-up care over to medical oncology.  Be happy to reevaluate the patient should she  need further palliative treatment in the future.  She continues treatment with medical oncology.  Patient knows to call with any concerns.  I would like to take this opportunity to thank you for allowing me to participate in the care of your patient.Noreene Filbert, MD

## 2022-01-03 NOTE — Progress Notes (Signed)
Hematology/Oncology Consult note [facility]  Telephone:([phone] Fax:([phone]  Patient Care Team: [facility], [facility] Making as PCP - General (Geriatric Medicine)   Name of the patient: Laura Santiago  [id number]  15-Aug-1946   Date of visit: 01/03/22  Diagnosis- metastatic HER2 positive breast cancer with bone and lymph node metastases    Chief complaint/ Reason for visit-on treatment assessment prior to cycle 10-day 1 of Taxol Herceptin and Perjeta  Heme/Onc history: Patient is a 76 year old female with a past medical history significant for stage IV CKD, history of DVT on Xarelto, venous stasis and chronic right lower extremity ulceration hypertension among other medical problems.  She had a screening mammogram in September 2014 which showed 2.1 x 2.3 x 1.8 cm irregular mass in her right breast.  It was ER 10% positive PR negative and HER2 positive +3.  She received neoadjuvant chemotherapy with Taxol Herceptin and Perjeta for 4 cycles followed by dose dense AC/Herceptin x4 which she completed in March 2015.  She had a right lumpectomy on 04/03/2014 which showed scant residual invasive ductal carcinoma [id number].  She completed 1 year of adjuvant Herceptin chemotherapy and also completed adjuvant radiation treatment.  She was recommended anastrozole which she took on and off starting November 2015 and stopped sometime in 2020.   She was then hospitalized with neck pain and was found to have lytic lesions involving C5-C6 with pathological vertebral fractures.  She underwent radiation treatment to this area.  Image guided biopsy of the L1 vertebral body showed metastatic carcinoma consistent with breast origin ER 10% PR 0% and HER2 amplified ratio 5.1 average HER2 signal number per cell 15.0 average CEP 17 signals number per cell 3.0.  Baseline echocardiogram on 05/02/2021 showed a normal EF of 62% she was recommended Taxol Herceptin and Perjeta which she  received for 2 cycles at [facility] until June 10, 2021   She has chronic pain from her bone metastases for which she is currently on oxycodone 10 mg every 4 hours as needed and 12 mcg fentanyl patch.  She was seeing pain clinic when she was living in [name].   Patient has also been on Zometa when she was in [name] but she does have some ongoing dental issues.  She has received Xgeva in the past as well.  Her last Delton See was in July 2021.  Last PET scan was on 03/21/2021 which showed diffuse osseous metastatic disease involving the head neck chest abdomen and pelvis and spine.  Left lung apex hypermetabolic nodule and multiple hypermetabolic liver lesions concerning for disease involvement.  Enlarged hypermetabolic left inguinal lymph nodes along with hypermetabolic external iliac and left supraclavicular lymph nodes   Patient is now moved to [place] to be close to her daughter.  She lives in an independent living. Currently patient is getting Taxol Herceptin and Perjeta every 3 weeks with weekly Taxol    Interval history-patient reports ongoing issues with her balance as well as handgrip.  She has ulceration over her right big toe which is again chronic and she will be seeing podiatry as well soon.  Stable nonhealing ulcers in the left lower extremity.  Feels like she has problems recollecting things at times.  Has chronic back pain and neck pain for which she is on as needed oxycodone.  She feels that the medication regimen works well for her but she has problems procuring medications at her nursing facility  ECOG PS- 2 Pain scale- 3  Review  of systems- Review of Systems  Constitutional:  Positive for malaise/fatigue. Negative for chills, fever and weight loss.  HENT:  Negative for congestion, ear discharge and nosebleeds.   Eyes:  Negative for blurred vision.  Respiratory:  Negative for cough, hemoptysis, sputum production, shortness of breath and wheezing.   Cardiovascular:  Negative for chest  pain, palpitations, orthopnea and claudication.  Gastrointestinal:  Negative for abdominal pain, blood in stool, constipation, diarrhea, heartburn, melena, nausea and vomiting.  Genitourinary:  Negative for dysuria, flank pain, frequency, hematuria and urgency.  Musculoskeletal:  Positive for back pain and neck pain. Negative for joint pain and myalgias.  Skin:  Negative for rash.  Neurological:  Positive for sensory change (Peripheral neuropathy). Negative for dizziness, tingling, focal weakness, seizures, weakness and headaches.  Endo/Heme/Allergies:  Does not bruise/bleed easily.  Psychiatric/Behavioral:  Negative for depression and suicidal ideas. The patient does not have insomnia.      Allergies  Allergen Reactions   Corticosteroids Other (See Comments)    Pt trf from [name] and per primary md for her cancer tx. Notes that it causes agitation intolerance   Sulfa Antibiotics Other (See Comments)    Pt moved from [name] and in MD notes she has allergy but we do not know reactions when taking the drug   Celebrex [Celecoxib] Rash     Past Medical History:  Diagnosis Date   ADHD (attention deficit hyperactivity disorder)    in [place], no date on md note   Anemia    IDA 11/26/2019, Anemia in stage 4 chronic kidney disease ([place]) 09/03/2020   Arthritis    osteoarthritis right knee 09/30/2014   Breast cancer ([place]) 10/05/2013   in [place], [place] -, Her 2 is 3+   Cancer related pain 03/28/2021   in [name], md notes spine mets   DVT of lower extremity, bilateral ([place]) 03/30/2014   in [name]   Generalized muscle weakness 03/31/2016   in [name]   GERD (gastroesophageal reflux disease) 05/15/2013   per md in [name]   Hyperparathyroidism, secondary ([place]) 04/25/2018   in [name]   Hypertension 02/20/2016   info from MD in [name]   Lumbar compression fracture ([place]) 03/18/2021   in [place] loss 02/05/2015   in [name]   Metabolic acidosis 96/03/5408   in [place]   Metabolic syndrome 81/19/1478   in  [name]   Osteopenia after menopause 03/29/2016   in [place] of both eyes 09/30/2012   per md in [name] where pt. 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   in [name]   Stasis ulcer of left lower extremity ([place]) 03/29/2016   in [name]     Past Surgical History:  Procedure Laterality Date   CESAREAN SECTION     unknown   fibroid removed  N/A    in [place] - unknown date   IR FLUORO GUIDE CV LINE LEFT  07/27/2021   IR PORT REPAIR CENTRAL VENOUS ACCESS DEVICE Left    In [place] N/A 02/25/2014   in [name]   ovary removed      unknown   Wise CATARACT EXTRACAP,INSERT LENS Bilateral  Bilateral 09/05/2012   in [place]     unknown    LUMPECTOMY Right 04/03/2014   in [place]    Social History   Socioeconomic History   Marital status: Divorced    Spouse name: Not on file   Number of  children: Not on file   Years of education: Not on file   Highest education level: Not on file  Occupational History   Occupation: retired Teacher, music    Comment: In [place]  Tobacco Use   Smoking status: Never   Smokeless tobacco: Never  Vaping Use   Vaping Use: Never used  Substance and Sexual Activity   Alcohol use: Not Currently   Drug use: Never   Sexual activity: Not Currently  Other Topics Concern   Not on file  Social History Narrative   Not on file   Social Determinants of Health   Financial Resource Strain: Not on file  Food Insecurity: Not on file  Transportation Needs: Not on file  Physical Activity: Not on file  Stress: Not on file  Social Connections: Not on file  Intimate Partner Violence: Not on file    Family History  Problem Relation Age of Onset   Pancreatic cancer Mother    Stroke Father    Diabetes Father    Hypertension Father    Heart disease Father    Skin cancer Father    Varicose Veins Father    Skin cancer Brother    Cancer - Prostate Brother      Current Outpatient Medications:     acetaminophen (TYLENOL) 500 MG tablet, Take 500-1,000 mg by mouth every 6 (six) hours as needed for mild pain or moderate pain., Disp: , Rfl:    amphetamine-dextroamphetamine (ADDERALL XR) 30 MG 24 hr capsule, Take 1 capsule (30 mg total) by mouth daily., Disp: 15 capsule, Rfl: 0   Calcium 200 MG TABS, Take 1 tablet by mouth daily., Disp: , Rfl:    colchicine 0.6 MG tablet, Take 1 tablet (0.6 mg total) by mouth 2 (two) times daily., Disp: , Rfl:    fentaNYL (DURAGESIC) 12 MCG/HR, Place 1 patch onto the skin every 3 (three) days. 1 patch to skin. Next dose on 12/10/2021 at 10:40 pm, Disp: 3 patch, Rfl: 0   lisinopril (ZESTRIL) 20 MG tablet, Take 20 mg by mouth daily., Disp: , Rfl:    Multiple Vitamins-Minerals (MULTIVITAMIN ADULTS) TABS, Take 1 tablet by mouth daily., Disp: , Rfl:    Oxycodone HCl 10 MG TABS, Take 1 tablet (10 mg total) by mouth every 4 (four) hours as needed., Disp: 12 tablet, Rfl: 0   sodium hypochlorite (DAKIN'S 1/4 STRENGTH) 0.125 % SOLN, Apply 1 application topically as directed. Moisten gauze with solution and wrap wound, Disp: , Rfl:    terbinafine (LAMISIL AT) 1 % cream, Apply 1 application topically 2 (two) times daily. (Patient taking differently: Apply 1 application topically 2 (two) times daily as needed (fungal treatment).), Disp: 30 g, Rfl: 5 No current facility-administered medications for this visit.  Facility-Administered Medications Ordered in Other Visits:    sodium chloride flush (NS) 0.9 % injection 10 mL, 10 mL, Intracatheter, PRN, [name], MD  Physical exam:  Vitals:   01/03/22 0826  Weight: 143 lb 6.4 oz (65 kg)  Height: 5' 2" (1.575 m)   Physical Exam Constitutional:      General: She is not in acute distress.    Comments: Ambulates with a walker  Cardiovascular:     Rate and Rhythm: Normal rate and regular rhythm.     Heart sounds: Normal heart sounds.  Pulmonary:     Effort: Pulmonary effort is normal.     Breath sounds: Normal breath  sounds.  Abdominal:     General: Bowel  sounds are normal.     Palpations: Abdomen is soft.  Musculoskeletal:     Comments: Dressing in place over left lower extremity.  Small ulceration noted over right big toe but otherwise right lower extremity appears normal with mild erythema but no evidence of cellulitis  Skin:    General: Skin is warm and dry.  Neurological:     Mental Status: She is alert and oriented to person, place, and time.     CMP Latest Ref Rng & Units 01/03/2022  Glucose 70 - 99 mg/dL 92  BUN 8 - 23 mg/dL 30(H)  Creatinine 0.44 - 1.00 mg/dL 1.61(H)  Sodium 135 - 145 mmol/L 137  Potassium 3.5 - 5.1 mmol/L 4.3  Chloride 98 - 111 mmol/L 105  CO2 22 - 32 mmol/L 26  Calcium 8.9 - 10.3 mg/dL 8.6(L)  Total Protein 6.5 - 8.1 g/dL 6.8  Total Bilirubin 0.3 - 1.2 mg/dL 0.5  Alkaline Phos 38 - 126 U/L 71  AST 15 - 41 U/L 20  ALT 0 - 44 U/L 13   CBC Latest Ref Rng & Units 01/03/2022  WBC 4.0 - 10.5 K/uL 4.1  Hemoglobin 12.0 - 15.0 g/dL 9.6(L)  Hematocrit 36.0 - 46.0 % 31.3(L)  Platelets 150 - 400 K/uL 242     Assessment and plan- Patient is a 76 y.o. female with metastatic weakly ER positive HER2 positive breast cancer with liver lymph node and bone metastases.  She is here for on treatment assessment prior to cycle 10-day 1 of Taxol Herceptin and Perjeta  Counts okay to proceed with cycle 10-day 1 of Taxol Herceptin and Perjeta today.  She will also receive Taxol in 1 week.  I will see her back in 3 weeks for cycle 11-day 1 of therapy.  Following that she will be getting repeat scans.  If scans show stable disease we will plan to hold off on giving any further Taxol and she will receive Herceptin and Perjeta alone every 3 weeks until progression or toxicity.  We will plan to get an echocardiogram in 4 to 5 weeks time as well.  Bone metastases: She will receive dose reduced Zometa of 3 mg today given that her creatinine is 1.6.  Her creatinine typically fluctuates between  1.2-1.3.  AKI on CKD: She will receive 1 L of IV fluids today.  Neoplasm related pain: Continue as needed oxycodone and fentanyl patch.  Chemo induced peripheral neuropathy: Patient has not started on the medication yet and wants to hold off for now.  If symptoms continue to bother her despite stopping Taxol we will initiate gabapentin at that time   Visit Diagnosis 1. Encounter for monoclonal antibody treatment for malignancy   2. Carcinoma of breast metastatic to bone, unspecified laterality ([place])   3. Chemotherapy-induced peripheral neuropathy ([place])   4. Encounter for monitoring zoledronic acid therapy      Dr. Randa Evens, MD, MPH [facility] [phone] 01/03/2022 8:36 AM

## 2022-01-03 NOTE — Progress Notes (Signed)
Per Dr. Janese Banks, ok to go ahead with treament today even though port does not give blood. Will give 1 liter of NS over 1 hour prior to treatment due to elevated creatinine.

## 2022-01-04 ENCOUNTER — Encounter (HOSPITAL_BASED_OUTPATIENT_CLINIC_OR_DEPARTMENT_OTHER): Payer: Medicare Other | Admitting: Internal Medicine

## 2022-01-04 DIAGNOSIS — S91201D Unspecified open wound of right great toe with damage to nail, subsequent encounter: Secondary | ICD-10-CM

## 2022-01-04 DIAGNOSIS — L97822 Non-pressure chronic ulcer of other part of left lower leg with fat layer exposed: Secondary | ICD-10-CM

## 2022-01-04 DIAGNOSIS — I87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: Secondary | ICD-10-CM | POA: Diagnosis not present

## 2022-01-04 DIAGNOSIS — L089 Local infection of the skin and subcutaneous tissue, unspecified: Secondary | ICD-10-CM | POA: Diagnosis not present

## 2022-01-04 NOTE — Progress Notes (Signed)
Laura, Santiago ([id number]) Visit Report for 01/04/2022 Chief Complaint Document Details Patient Name: Laura Santiago, Laura Santiago. Date of Service: 01/04/2022 2:00 PM Medical Record Number: [id number] Patient Account Number: [id number] Date of Birth/Sex: 18-Jun-1946 (76 y.o. Female) Treating RN: Cornell Barman Primary Care Provider: Orvis Brill, DOCTORS Other Clinician: Referring Provider: Orvis Brill, DOCTORS Treating Provider/Extender: Yaakov Guthrie in Treatment: 26 Information Obtained from: Patient Chief Complaint Left lower extremity wound Right toe wounds Left upper lateral thigh wounds Electronic Signature(s) Signed: 01/04/2022 3:36:58 PM By: Kalman Shan DO Entered By: Kalman Shan on 01/04/2022 15:24:39 Laura Santiago ([id number]) -------------------------------------------------------------------------------- Debridement Details Patient Name: Laura Santiago. Date of Service: 01/04/2022 2:00 PM Medical Record Number: [id number] Patient Account Number: [id number] Date of Birth/Sex: 1946-05-28 (76 y.o. Female) Treating RN: Cornell Barman Primary Care Provider: Priscille Kluver Other Clinician: Referring Provider: Orvis Brill, DOCTORS Treating Provider/Extender: Yaakov Guthrie in Treatment: 26 Debridement Performed for Wound #2 Right Toe Great Assessment: Performed By: Physician Kalman Shan, MD Debridement Type: Debridement Level of Consciousness (Pre- Awake and Alert procedure): Pre-procedure Verification/Time Out Yes - 15:10 Taken: Total Area Debrided (L x W): 0.7 (cm) x 2 (cm) = 1.4 (cm) Tissue and other material Viable, Non-Viable, Slough, Skin: Dermis , Skin: Epidermis, Slough debrided: Level: Skin/Epidermis Debridement Description: Selective/Open Wound Instrument: Curette Bleeding: Moderate Hemostasis Achieved: Pressure Response to Treatment: Procedure was tolerated well Level of Consciousness (Post- Awake and Alert procedure): Post  Debridement Measurements of Total Wound Length: (cm) 0.7 Width: (cm) 2 Depth: (cm) 0.2 Volume: (cm) 0.22 Character of Wound/Ulcer Post Debridement: Stable Post Procedure Diagnosis Same as Pre-procedure Electronic Signature(s) Signed: 01/04/2022 3:36:58 PM By: Kalman Shan DO Signed: 01/04/2022 4:40:14 PM By: Gretta Cool, BSN, RN, CWS, [name] RN, BSN Entered By: Gretta Cool, BSN, RN, CWS, [name] on 01/04/2022 15:13:59 JANEQUA, KIPNIS ([id number]) -------------------------------------------------------------------------------- HPI Details Patient Name: Laura Santiago. Date of Service: 01/04/2022 2:00 PM Medical Record Number: [id number] Patient Account Number: [id number] Date of Birth/Sex: [birth year] (76 y.o. Female) Treating RN: Cornell Barman Primary Care Provider: Priscille Kluver Other Clinician: Referring Provider: Orvis Brill, DOCTORS Treating Provider/Extender: Yaakov Guthrie in Treatment: 26 History of Present Illness HPI Description: Admission 7/27 Laura Santiago is a 76 year old female with a past medical history of ADHD, metastatic breast cancer, stage IV chronic kidney disease, history of DVT on Xarelto and chronic venous insufficiency that presents to the clinic for a chronic left lower extremity wound. She recently moved to [facility] 4 days ago. She was being followed by wound care center in [name]. She reports a 10-year history of wounds to her left lower extremity that eventually do heal with debridement and compression therapy. She states that the current wound reopened 4 months ago and she is using Vaseline and Coban. She denies signs of infection. 8/3; patient presents for 1 week follow-up. She reports no issues or complaints today. She states she had vascular studies done in the last week. She denies signs of infection. She brought her little service dog with her today. 8/17; patient presents for follow-up. She has missed her last clinic appointment. She  states she took the wrap off and attempted to rewrap her leg. She is having difficulty with transportation. She has her service dog with her today. Overall she feels well and reports improvement in wound healing. She denies signs of infection. She reports owning an old Velcro wrap compression and has this at her living facility 9/14; patient presents for follow-up. Patient states that over the past 2 to 3 weeks she developed  toe wounds to her right foot. She attributes this to tight fitting shoes. She subsequently developed cellulitis in the right leg and has been treated by doxycycline by her oncologist. She reports improvement in symptoms however continues to have some redness and swelling to this leg. To the left lower extremity patient has been having her wraps changed with home health twice weekly. She states that the [facility] is not helping control the drainage. Other than that she has no issues or complaints today. She denies signs of infection to the left lower extremity. 9/21; patient presents for follow-up. She reports seeing infectious disease for her cellulitis. She reports no further management. She has home health that changes the wraps twice weekly. She has no issues or complaints today. She denies signs of infection. 10/5; patient presents for follow-up. She has no issues or complaints today. She denies signs of infection. She states that the right great toe has not been dressed by home health. 10/12; patient presents for follow-up. She has no issues or complaints today. She reports improvement in her wound healing. She has been using silver alginate to the right great toe wound. She denies signs of infection. 10/26; patient presents for follow-up. Home health did not have sorbact so they continued to use Hydrofera Blue under the wrap. She has been using silver alginate to the great toe wound however she did not have a dressing in place today. She currently denies signs of  infection. 11/2; patient presents for follow-up. She has been using sorb act under the compression wrap. She reports using silver alginate to the toe wound again she does not have a dressing in place. She currently denies signs of infection. 11/23; patient presents for follow-up. Unfortunately she has missed her last 2 clinic appointments. She was last seen 3 weeks ago. She did her own compression wrap with Kerlix and Coban yesterday after seeing vein and vascular. She has not been dressing her right great toe wound. She currently denies signs of infection. 11/30; patient presents for 1 week follow-up. She states she changed her dressing last week prior to home health and use sorb act with Dakin's and Hydrofera Blue. Home health has changed the dressing as well and they have been using sorbact. Today she reports increased redness to her right lower extremity. She has a history of cellulitis to this leg. She has been using silver alginate to the right great toe. Unfortunately she had an episode of diarrhea prior to coming in and had feces all over the right leg and to the wrap of her left leg. 12/7; patient presents for 1 week follow-up. She states that home health did not come out to change the dressing and she took it off yesterday. It is unclear if she is dressing the right toe wound. She denies signs of infection. 12/14; patient presents for 1 week follow-up. She has no issues or complaints today. 12/21; patient presents for follow-up. She has no issues or complaints today. She denies signs of infection. 12/28/2021; patient presents for follow-up. She was hospitalized for sepsis secondary to right lower extremity cellulitis On 12/23. She states she is currently at a SNF. She states that she was started on doxycycline this morning for her right great toe swelling and redness. She is not sure what dressings have been done to her left lower extremity for the past 3 weeks. She says its been mainly  gauze with an Ace wrap. 1/25; patient presents for follow-up. She is still residing in a  skilled nursing facility. She reports mild pain to the left lower extremity wound bed. She states she is going to see a podiatrist soon. Electronic Signature(s) Signed: 01/04/2022 3:36:58 PM By: Kalman Shan DO Entered By: Kalman Shan on 01/04/2022 15:26:35 YUNA, PIZZOLATO ([id number]) Quentin Ore, Ralene Muskrat ([id number]) -------------------------------------------------------------------------------- Physical Exam Details Patient Name: FOTINI, LEMUS. Date of Service: 01/04/2022 2:00 PM Medical Record Number: [id number] Patient Account Number: [id number] Date of Birth/Sex: Apr 21, 1946 (76 y.o. Female) Treating RN: Cornell Barman Primary Care Provider: Priscille Kluver Other Clinician: Referring Provider: Orvis Brill, DOCTORS Treating Provider/Extender: Yaakov Guthrie in Treatment: 26 Constitutional . Cardiovascular . Psychiatric . Notes Left lower extremity: Large open wound to the medial aspect with nonviable tissue and granulation tissue present. Mild tenderness to the wound bed. Right foot: To the right great toe there is an open wound with maceration and devitalized tissue with granulation tissue postdebridement. No drainage noted. Electronic Signature(s) Signed: 01/04/2022 3:36:58 PM By: Kalman Shan DO Entered By: Kalman Shan on 01/04/2022 15:27:29 Laura Santiago ([id number]) -------------------------------------------------------------------------------- Physician Orders Details Patient Name: Laura Santiago. Date of Service: 01/04/2022 2:00 PM Medical Record Number: [id number] Patient Account Number: [id number] Date of Birth/Sex: 1946-07-24 (76 y.o. Female) Treating RN: Cornell Barman Primary Care Provider: Orvis Brill, DOCTORS Other Clinician: Referring Provider: Orvis Brill, DOCTORS Treating Provider/Extender: Yaakov Guthrie in Treatment: 21 Verbal /  Phone Orders: No Diagnosis Coding ICD-10 Coding Code Description [phone] 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 S91.201D Unspecified open wound of right great toe with damage to nail, subsequent encounter I87.2 Venous insufficiency (chronic) (peripheral) Z79.01 Long term (current) use of anticoagulants I10 Essential (primary) hypertension 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 Follow-up Appointments o Return Appointment in 2 weeks. o Nurse Visit as needed Bathing/ Shower/ Hygiene o May shower with wound dressing protected with water repellent cover or cast protector. o No tub bath. Anesthetic (Use 'Patient Medications' Section for Anesthetic Order Entry) o Lidocaine applied to wound bed Edema Control - Lymphedema / Segmental Compressive Device / Other o Elevate leg(s) parallel to the floor when sitting. o DO YOUR BEST to sleep in the bed at night. DO NOT sleep in your recliner. Long hours of sitting in a recliner leads to swelling of the legs and/or potential wounds on your backside. Additional Orders / Instructions o Follow Nutritious Diet and Increase Protein Intake Wound Treatment Wound #1 - Lower Leg Wound Laterality: Left, Medial Cleanser: Soap and Water (Home Health) (Generic) 2 x Per Day/30 Days Discharge Instructions: Gently cleanse wound with antibacterial soap, rinse and pat dry prior to dressing wounds Topical: Gentamicin 2 x Per Day/30 Days Discharge Instructions: Apply as directed by provider. Today (12/28/21) in office only Primary Dressing: Gauze 2 x Per Day/30 Days Discharge Instructions: As directed: dry, moistened with saline or moistened with Dakins Solution to start 2 x daily 12/29/21 Secondary Dressing: ABD Pad 5x9 (in/in) 2 x Per Day/30 Days Discharge Instructions: Cover with ABD pad Secured With: 41M ACE Elastic  Bandage With VELCRO Brand Closure, 4 (in) 2 x Per Day/30 Days Wound #2 - Toe Great Wound Laterality: Right Cleanser: Wound Cleanser (Home Health) (Generic) 3 x Per Week/30 Days Discharge Instructions: Wash your hands with soap and water. Remove old dressing, discard into plastic bag and place into trash. Cleanse the wound with Wound Cleanser prior to applying a clean dressing using gauze sponges, not tissues or cotton balls. Do  not scrub or use excessive force. Pat dry using gauze sponges, not tissue or cotton balls. Primary Dressing: Silvercel Small 2x2 (in/in) (Home Health) (Generic) 3 x Per Week/30 Days AAHANA, ELZA ([id number]) Discharge Instructions: Apply Silvercel Small 2x2 (in/in) as instructed Secondary Dressing: Gauze ([place]) (Generic) 3 x Per Week/30 Days Discharge Instructions: Cover with dry gauze Secured With: 90M Medipore H Soft Cloth Surgical Tape, 2x2 (in/yd) ([place]) (Generic) 3 x Per Week/30 Days Discharge Instructions: Secure dressing Patient Medications Allergies: Celebrex Notifications Medication Indication Start End ciprofloxacin HCl 01/04/2022 DOSE 1 - oral 500 mg tablet - 1 tablet oral BID x 7 days gentamicin 01/04/2022 DOSE 1 - topical 0.1 % cream - 1 application daily Electronic Signature(s) Signed: 01/04/2022 3:36:58 PM By: Kalman Shan DO Previous Signature: 01/04/2022 3:34:46 PM Version By: Kalman Shan DO Entered By: Kalman Shan on 01/04/2022 15:36:36 Laura Santiago ([id number]) -------------------------------------------------------------------------------- Problem List Details Patient Name: Laura Santiago. Date of Service: 01/04/2022 2:00 PM Medical Record Number: [id number] Patient Account Number: [id number] Date of Birth/Sex: Jul 25, 1946 (76 y.o. Female) Treating RN: Cornell Barman Primary Care Provider: Orvis Brill, DOCTORS Other Clinician: Referring Provider: Orvis Brill, DOCTORS Treating Provider/Extender: Yaakov Guthrie in Treatment: 26 Active Problems ICD-10 Encounter Code Description Active Date MDM Diagnosis L97.822 Non-pressure chronic ulcer of other part of left lower leg with fat layer 11/02/2021 No Yes exposed I87.312 Chronic venous hypertension (idiopathic) with ulcer of left lower 11/02/2021 No Yes extremity S91.201D Unspecified open wound of right great toe with damage to nail, 08/31/2021 No Yes subsequent encounter I87.2 Venous insufficiency (chronic) (peripheral) 07/06/2021 No Yes Z79.01 Long term (current) use of anticoagulants 07/06/2021 No Yes I10 Essential (primary) hypertension 07/06/2021 No Yes 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 07/06/2021 No Yes Inactive Problems ICD-10 Code Description Active Date Inactive Date S81.802A Unspecified open wound, left lower leg, initial encounter 07/06/2021 07/06/2021 S91.101A Unspecified open wound of right great toe without damage to nail, initial 08/24/2021 08/24/2021 encounter S91.104A Unspecified open wound of right lesser toe(s) without damage to nail, initial 08/24/2021 08/24/2021 encounter Resolved Problems ICD-10 ADITRI, LOUISCHARLES ([id number]) Code Description Active Date Resolved Date S91.104D Unspecified open wound of right lesser toe(s) without damage to nail, 08/31/2021 08/31/2021 subsequent encounter Electronic Signature(s) Signed: 01/04/2022 3:36:58 PM By: Kalman Shan DO Entered By: Kalman Shan on 01/04/2022 15:24:32 Laura Santiago ([id number]) -------------------------------------------------------------------------------- Progress Note Details Patient Name: Laura Santiago. Date of Service: 01/04/2022 2:00 PM Medical Record Number: [id number] Patient Account Number: [id number] Date of Birth/Sex: [birth year] (76 y.o. Female) Treating RN: Cornell Barman Primary Care Provider: Priscille Kluver Other Clinician: Referring Provider: Orvis Brill, DOCTORS Treating Provider/Extender: Yaakov Guthrie in  Treatment: 26 Subjective Chief Complaint Information obtained from Patient Left lower extremity wound Right toe wounds Left upper lateral thigh wounds History of Present Illness (HPI) Admission 7/27 Ms. Loella Hickle is a 76 year old female with a past medical history of ADHD, metastatic breast cancer, stage IV chronic kidney disease, history of DVT on Xarelto and chronic venous insufficiency that presents to the clinic for a chronic left lower extremity wound. She recently moved to [facility] 4 days ago. She was being followed by wound care center in [name]. She reports a 10-year history of wounds to her left lower extremity that eventually do heal with debridement and compression therapy. She states that the current wound reopened 4 months ago and she is using Vaseline and Coban. She denies signs of infection. 8/3; patient presents for 1 week follow-up. She reports no issues  or complaints today. She states she had vascular studies done in the last week. She denies signs of infection. She brought her little service dog with her today. 8/17; patient presents for follow-up. She has missed her last clinic appointment. She states she took the wrap off and attempted to rewrap her leg. She is having difficulty with transportation. She has her service dog with her today. Overall she feels well and reports improvement in wound healing. She denies signs of infection. She reports owning an old Velcro wrap compression and has this at her living facility 9/14; patient presents for follow-up. Patient states that over the past 2 to 3 weeks she developed toe wounds to her right foot. She attributes this to tight fitting shoes. She subsequently developed cellulitis in the right leg and has been treated by doxycycline by her oncologist. She reports improvement in symptoms however continues to have some redness and swelling to this leg. To the left lower extremity patient has been having her wraps  changed with home health twice weekly. She states that the [facility] is not helping control the drainage. Other than that she has no issues or complaints today. She denies signs of infection to the left lower extremity. 9/21; patient presents for follow-up. She reports seeing infectious disease for her cellulitis. She reports no further management. She has home health that changes the wraps twice weekly. She has no issues or complaints today. She denies signs of infection. 10/5; patient presents for follow-up. She has no issues or complaints today. She denies signs of infection. She states that the right great toe has not been dressed by home health. 10/12; patient presents for follow-up. She has no issues or complaints today. She reports improvement in her wound healing. She has been using silver alginate to the right great toe wound. She denies signs of infection. 10/26; patient presents for follow-up. Home health did not have sorbact so they continued to use Hydrofera Blue under the wrap. She has been using silver alginate to the great toe wound however she did not have a dressing in place today. She currently denies signs of infection. 11/2; patient presents for follow-up. She has been using sorb act under the compression wrap. She reports using silver alginate to the toe wound again she does not have a dressing in place. She currently denies signs of infection. 11/23; patient presents for follow-up. Unfortunately she has missed her last 2 clinic appointments. She was last seen 3 weeks ago. She did her own compression wrap with Kerlix and Coban yesterday after seeing vein and vascular. She has not been dressing her right great toe wound. She currently denies signs of infection. 11/30; patient presents for 1 week follow-up. She states she changed her dressing last week prior to home health and use sorb act with Dakin's and Hydrofera Blue. Home health has changed the dressing as well and they  have been using sorbact. Today she reports increased redness to her right lower extremity. She has a history of cellulitis to this leg. She has been using silver alginate to the right great toe. Unfortunately she had an episode of diarrhea prior to coming in and had feces all over the right leg and to the wrap of her left leg. 12/7; patient presents for 1 week follow-up. She states that home health did not come out to change the dressing and she took it off yesterday. It is unclear if she is dressing the right toe wound. She denies signs of infection. 12/14;  patient presents for 1 week follow-up. She has no issues or complaints today. 12/21; patient presents for follow-up. She has no issues or complaints today. She denies signs of infection. 12/28/2021; patient presents for follow-up. She was hospitalized for sepsis secondary to right lower extremity cellulitis On 12/23. She states she is currently at a SNF. She states that she was started on doxycycline this morning for her right great toe swelling and redness. She is not sure what dressings have been done to her left lower extremity for the past 3 weeks. She says its been mainly gauze with an Ace wrap. 1/25; patient presents for follow-up. She is still residing in a skilled nursing facility. She reports mild pain to the left lower extremity wound bed. She states she is going to see a podiatrist soon. SANTOSHA, JIVIDEN ([id number]) Objective Constitutional Vitals Time Taken: 2:27 PM, Height: 66 in, Weight: 153 lbs, BMI: 24.7, Temperature: 98.6 F, Pulse: 92 bpm, Respiratory Rate: 18 breaths/min, Blood Pressure: 157/83 mmHg. General Notes: Left lower extremity: Large open wound to the medial aspect with nonviable tissue and granulation tissue present. Mild tenderness to the wound bed. Right foot: To the right great toe there is an open wound with maceration and devitalized tissue with granulation tissue postdebridement. No drainage  noted. Integumentary (Hair, Skin) Wound #1 status is Open. Original cause of wound was Gradually Appeared. The date acquired was: 04/06/2021. The wound has been in treatment 26 weeks. The wound is located on the Left,Medial Lower Leg. The wound measures 10.5cm length x 11cm width x 0.5cm depth; 90.713cm^2 area and 45.357cm^3 volume. There is Fat Layer (Subcutaneous Tissue) exposed. There is no tunneling or undermining noted. There is a large amount of serosanguineous drainage noted. The wound margin is flat and intact. There is small (1-33%) red granulation within the wound bed. There is a medium (34-66%) amount of necrotic tissue within the wound bed including Adherent Slough. Wound #2 status is Open. Original cause of wound was Gradually Appeared. The date acquired was: 08/24/2021. The wound has been in treatment 19 weeks. The wound is located on the Right Toe Great. The wound measures 0.7cm length x 2cm width x 0.2cm depth; 1.1cm^2 area and 0.22cm^3 volume. There is Fat Layer (Subcutaneous Tissue) exposed. There is a medium amount of serous drainage noted. The wound margin is indistinct and nonvisible. There is medium (34-66%) pink granulation within the wound bed. There is a medium (34-66%) amount of necrotic tissue within the wound bed. Assessment Active Problems ICD-10 Non-pressure chronic ulcer of other part of left lower leg with fat layer exposed Chronic venous hypertension (idiopathic) with ulcer of left lower extremity Unspecified open wound of right great toe with damage to nail, subsequent encounter Venous insufficiency (chronic) (peripheral) Long term (current) use of anticoagulants Essential (primary) hypertension Secondary malignant neoplasm of breast Patient's wounds have shown improvement in appearance since last clinic visit. She had wound cultures done to the toe and leg. The toe wound grew Proteus mirabilis and MRSA. The MRSA was sensitive to doxycycline for which she states  she took in the past week. The Proteus mirabilis is sensitive to ciprofloxacin. The leg culture has grown MRSA and Pseudomonas. The MRSA was sensitive to Doxy and the Pseudomonas sensitive to Cipro. At this time I will prescribe her ciprofloxacin. I recommended gentamicin ointment To the left leg wound bed. Her cultures were sensitive to this. I debrided nonviable tissue to the toe wound. I recommended silver alginate to This. Patient states she is  having trouble with transportation from her SNF. I recommended follow-up in 2 weeks. 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 There was a Selective/Open Wound Skin/Epidermis Debridement with a total area of 1.4 sq cm performed by Kalman Shan, MD. With the following instrument(s): Curette to remove Viable and Non-Viable tissue/material. Material removed includes Slough, Skin: Dermis, and Skin: Epidermis. No specimens were taken. A time out was conducted at 15:10, prior to the start of the procedure. A Moderate amount of bleeding was controlled with Pressure. The procedure was tolerated well. Post Debridement Measurements: 0.7cm length x 2cm width x 0.2cm depth; 0.22cm^3 volume. Character of Wound/Ulcer Post Debridement is stable. Post procedure Diagnosis Wound #2: Same as Pre-Procedure KENIA, TEAGARDEN ([id number]) Plan Follow-up Appointments: Return Appointment in 2 weeks. Nurse Visit as needed Bathing/ Shower/ Hygiene: May shower with wound dressing protected with water repellent cover or cast protector. No tub bath. Anesthetic (Use 'Patient Medications' Section for Anesthetic Order Entry): Lidocaine applied to wound bed Edema Control - Lymphedema / Segmental Compressive Device / Other: Elevate leg(s) parallel to the floor when sitting. DO YOUR BEST to sleep in the bed at night. DO NOT sleep in your recliner. Long hours of sitting in a recliner leads to  swelling of the legs and/or potential wounds on your backside. Additional Orders / Instructions: Follow Nutritious Diet and Increase Protein Intake The following medication(s) was prescribed: ciprofloxacin HCl oral 500 mg tablet 1 1 tablet oral BID x 7 days starting 01/04/2022 gentamicin topical 0.1 % cream 1 1 application daily starting 01/04/2022 WOUND #1: - Lower Leg Wound Laterality: Left, Medial Cleanser: Soap and Water (Home Health) (Generic) 2 x Per Day/30 Days Discharge Instructions: Gently cleanse wound with antibacterial soap, rinse and pat dry prior to dressing wounds Topical: Gentamicin 2 x Per Day/30 Days Discharge Instructions: Apply as directed by provider. Today (12/28/21) in office only Primary Dressing: Gauze 2 x Per Day/30 Days Discharge Instructions: As directed: dry, moistened with saline or moistened with Dakins Solution to start 2 x daily 12/29/21 Secondary Dressing: ABD Pad 5x9 (in/in) 2 x Per Day/30 Days Discharge Instructions: Cover with ABD pad Secured With: 55M ACE Elastic Bandage With VELCRO Brand Closure, 4 (in) 2 x Per Day/30 Days WOUND #2: - Toe Great Wound Laterality: Right Cleanser: Wound Cleanser (Home Health) (Generic) 3 x Per Week/30 Days Discharge Instructions: Wash your hands with soap and water. Remove old dressing, discard into plastic bag and place into trash. Cleanse the wound with Wound Cleanser prior to applying a clean dressing using gauze sponges, not tissues or cotton balls. Do not scrub or use excessive force. Pat dry using gauze sponges, not tissue or cotton balls. Primary Dressing: Silvercel Small 2x2 (in/in) (Home Health) (Generic) 3 x Per Week/30 Days Discharge Instructions: Apply Silvercel Small 2x2 (in/in) as instructed Secondary Dressing: Gauze (Home Health) (Generic) 3 x Per Week/30 Days Discharge Instructions: Cover with dry gauze Secured With: 55M Medipore H Soft Cloth Surgical Tape, 2x2 (in/yd) ([place]) (Generic) 3 x Per Week/30  Days Discharge Instructions: Secure dressing 1. In office sharp debridement 2. Ciprofloxacin 3. Gentamicin ointment 4. Follow-up in 2 weeks Electronic Signature(s) Signed: 01/04/2022 3:36:58 PM By: Kalman Shan DO Entered By: Kalman Shan on 01/04/2022 15:35:19 Laura Santiago ([id number]) -------------------------------------------------------------------------------- [place] Details Patient Name: Laura Santiago. Date of Service: 01/04/2022 Medical Record Number: [id number] Patient Account Number: [id number] Date of Birth/Sex: [birth year] (76 y.o. 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 S91.201D Unspecified open wound of right great toe with damage to nail, subsequent encounter I87.2 Venous insufficiency (chronic) (peripheral) Z79.01 Long term (current) use of anticoagulants I10 Essential (primary) hypertension 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 L08.9 Local infection of the skin and subcutaneous tissue, unspecified Facility Procedures CPT4 Code: 75883254 Description: 98264 - DEBRIDE WOUND 1ST 20 SQ CM OR < Modifier: Quantity: 1 CPT4 Code: Description: ICD-10 Diagnosis Description S91.201D Unspecified open wound of right great toe with damage to nail, subseque Modifier: nt encounter Quantity: Physician Procedures CPT4 Code: 1583094 Description: 07680 - WC PHYS LEVEL 4 - EST PT Modifier: Quantity: 1 CPT4 Code: Description: ICD-10 Diagnosis Description L08.9 Local infection of the skin and subcutaneous tissue, unspecified L97.822 Non-pressure chronic ulcer of other part of left  lower leg with fat lay I87.312 Chronic venous hypertension (idiopathic) with ulcer  of left lower extre S91.201D Unspecified open wound of right great toe with damage to nail, subseque Modifier: er exposed mity nt encounter Quantity: CPT4 Code: 8811031 Description: 59458 - WC PHYS DEBR WO ANESTH 20 SQ CM Modifier: Quantity: 1 CPT4 Code: Description: ICD-10 Diagnosis Description S91.201D Unspecified open wound of right great toe with damage to nail, subseque Modifier: nt encounter Quantity: Electronic Signature(s) Signed: 01/04/2022 3:36:58 PM By: Kalman Shan DO Entered By: Kalman Shan on 01/04/2022 15:36:13

## 2022-01-04 NOTE — Progress Notes (Signed)
SAMANVITHA, GERMANY ([id number]) Visit Report for 01/04/2022 Arrival Information Details Patient Name: Laura Santiago, Laura Santiago. Date of Service: 01/04/2022 2:00 PM Medical Record Number: [id number] Patient Account Number: [id number] Date of Birth/Sex: [birth year] (76 y.o. Female) Treating RN: Cornell Barman Primary Care Kaesen Rodriguez: Orvis Brill, DOCTORS Other Clinician: Referring Seona Clemenson: Orvis Brill, DOCTORS Treating Mailee Klaas/Extender: Yaakov Guthrie in Treatment: 26 Visit Information History Since Last Visit Added or deleted any medications: No Patient Arrived: Walker Has Dressing in Place as Prescribed: Yes Arrival Time: 14:24 Pain Present Now: No Accompanied By: self Transfer Assistance: None Patient Identification Verified: Yes Secondary Verification Process Completed: Yes Patient Has Alerts: Yes Patient Alerts: PT HAS SERVICE ANIMAL ABI 07/11/21 R) 1.16 L) 1.27 Electronic Signature(s) Signed: 01/04/2022 4:40:14 PM By: Gretta Cool, BSN, RN, CWS, [name] RN, BSN Entered By: Gretta Cool, BSN, RN, CWS, [name] on 01/04/2022 14:27:29 Laura Santiago ([id number]) -------------------------------------------------------------------------------- Encounter Discharge Information Details Patient Name: Laura Santiago. Date of Service: 01/04/2022 2:00 PM Medical Record Number: [id number] Patient Account Number: [id number] Date of Birth/Sex: July 12, 1946 (76 y.o. Female) Treating RN: Cornell Barman Primary Care Karron Alvizo: Orvis Brill, DOCTORS Other Clinician: Referring Ehtan Delfavero: Orvis Brill, DOCTORS Treating Khrystyna Schwalm/Extender: Yaakov Guthrie in Treatment: 26 Encounter Discharge Information Items Post Procedure Vitals Discharge Condition: Stable Temperature (F): 98.6 Ambulatory Status: Walker Pulse (bpm): 92 Discharge Destination: [place] Respiratory Rate (breaths/min): 16 Telephoned: No Blood Pressure (mmHg): 157/83 Orders Sent: Yes Transportation: Private Auto Accompanied By:  self Schedule Follow-up Appointment: Yes Clinical Summary of Care: Electronic Signature(s) Signed: 01/04/2022 4:39:26 PM By: Gretta Cool, BSN, RN, CWS, [name] RN, BSN Entered By: Gretta Cool, BSN, RN, CWS, [name] on 01/04/2022 16:39:26 Laura Santiago ([id number]) -------------------------------------------------------------------------------- Lower Extremity Assessment Details Patient Name: Laura Santiago, Laura Santiago. Date of Service: 01/04/2022 2:00 PM Medical Record Number: [id number] Patient Account Number: [id number] Date of Birth/Sex: [birth year] (75 y.o. Female) Treating RN: Cornell Barman Primary Care Kymir Coles: Orvis Brill, DOCTORS Other Clinician: Referring Calyn Sivils: HOUSECALLS, DOCTORS Treating Falesha Schommer/Extender: Yaakov Guthrie in Treatment: 26 Edema Assessment Assessed: [Left: No] [Right: No] [Left: Edema] [Right: :] Calf Left: Right: Point of Measurement: 29 cm From Medial Instep 31.2 cm 33 cm Ankle Left: Right: Point of Measurement: 10 cm From Medial Instep 24.5 cm 25 cm Vascular Assessment Pulses: Dorsalis Pedis Palpable: [Left:Yes] [Right:Yes] Electronic Signature(s) Signed: 01/04/2022 4:40:14 PM By: Gretta Cool, BSN, RN, CWS, [name] RN, BSN Entered By: Gretta Cool, BSN, RN, CWS, [name] on 01/04/2022 14:42:07 Laura Santiago ([id number]) -------------------------------------------------------------------------------- Multi Wound Chart Details Patient Name: Laura Santiago. Date of Service: 01/04/2022 2:00 PM Medical Record Number: [id number] Patient Account Number: [id number] Date of Birth/Sex: 1946-04-29 (76 y.o. Female) Treating RN: Cornell Barman Primary Care Drevon Plog: Orvis Brill, DOCTORS Other Clinician: Referring Katarina Riebe: HOUSECALLS, DOCTORS Treating Lamel Mccarley/Extender: Yaakov Guthrie in Treatment: 26 Vital Signs Height(in): 66 Pulse(bpm): 92 Weight(lbs): 153 Blood Pressure(mmHg): 157/83 Body Mass Index(BMI): 24.7 Temperature(F): 98.6 Respiratory Rate(breaths/min): 18 Wound  Assessments Treatment Notes Electronic Signature(s) Signed: 01/04/2022 4:40:14 PM By: Gretta Cool, BSN, RN, CWS, [name] RN, BSN Entered By: Gretta Cool, BSN, RN, CWS, [name] on 01/04/2022 14:30:47 Laura Santiago ([id number]) -------------------------------------------------------------------------------- Multi-Disciplinary Care Plan Details Patient Name: Laura Santiago, Laura Santiago. Date of Service: 01/04/2022 2:00 PM Medical Record Number: [id number] Patient Account Number: [id number] Date of Birth/Sex: 18-Sep-1946 (76 y.o. Female) Treating RN: Cornell Barman Primary Care Janmarie Smoot: Orvis Brill, DOCTORS Other Clinician: Referring Joplin Canty: Orvis Brill, DOCTORS Treating Jamiah Homeyer/Extender: Yaakov Guthrie in Treatment: 26 Active Inactive Wound/Skin Impairment Nursing Diagnoses: Impaired tissue integrity Goals: Patient/caregiver will verbalize understanding of skin care regimen Date Initiated: 07/06/2021 Date Inactivated: 07/27/2021 Target Resolution  Date: 07/06/2021 Goal Status: Met Ulcer/skin breakdown will have a volume reduction of 30% by week 4 Date Initiated: 07/06/2021 Date Inactivated: 10/12/2021 Target Resolution Date: 08/06/2021 Goal Status: Unmet Unmet Reason: cont tx Ulcer/skin breakdown will have a volume reduction of 50% by week 8 Date Initiated: 07/06/2021 Target Resolution Date: 09/06/2021 Goal Status: Active Ulcer/skin breakdown will have a volume reduction of 80% by week 12 Date Initiated: 07/06/2021 Target Resolution Date: 10/06/2021 Goal Status: Active Ulcer/skin breakdown will heal within 14 weeks Date Initiated: 07/06/2021 Target Resolution Date: 11/06/2021 Goal Status: Active Interventions: Assess patient/caregiver ability to obtain necessary supplies Assess patient/caregiver ability to perform ulcer/skin care regimen upon admission and as needed Assess ulceration(s) every visit Treatment Activities: Referred to DME Boen Sterbenz for dressing supplies : 07/06/2021 Skin care regimen  initiated : 07/06/2021 Notes: Electronic Signature(s) Signed: 01/04/2022 4:40:14 PM By: Gretta Cool, BSN, RN, CWS, [name] RN, BSN Entered By: Gretta Cool, BSN, RN, CWS, [name] on 01/04/2022 14:30:30 Laura Santiago, Laura Santiago ([id number]) -------------------------------------------------------------------------------- Pain Assessment Details Patient Name: REIZY, DUNLOW. Date of Service: 01/04/2022 2:00 PM Medical Record Number: [id number] Patient Account Number: [id number] Date of Birth/Sex: 1946-06-05 (76 y.o. Female) Treating RN: Cornell Barman Primary Care Rachelann Enloe: Orvis Brill, DOCTORS Other Clinician: Referring Wash Nienhaus: Orvis Brill, DOCTORS Treating Telissa Palmisano/Extender: Yaakov Guthrie in Treatment: 26 Active Problems Location of Pain Severity and Description of Pain Patient Has Paino No Site Locations Pain Management and Medication Current Pain Management: Notes patient denies pain at this time. Electronic Signature(s) Signed: 01/04/2022 4:40:14 PM By: Gretta Cool, BSN, RN, CWS, [name] RN, BSN Entered By: Gretta Cool, BSN, RN, CWS, [name] on 01/04/2022 14:28:56 Laura Santiago ([id number]) -------------------------------------------------------------------------------- Patient/Caregiver Education Details Patient Name: Laura Santiago, Laura Santiago. Date of Service: 01/04/2022 2:00 PM Medical Record Number: [id number] Patient Account Number: [id number] Date of Birth/Gender: Aug 29, 1946 (76 y.o. Female) Treating RN: Cornell Barman Primary Care Physician: Orvis Brill, DOCTORS Other Clinician: Referring Physician: Orvis Brill, DOCTORS Treating Physician/Extender: Yaakov Guthrie in Treatment: 92 Education Assessment Education Provided To: Patient Education Topics Provided Wound/Skin Impairment: Handouts: Caring for Your Ulcer, Other: continue wound care as prescribed Electronic Signature(s) Signed: 01/04/2022 4:40:14 PM By: Gretta Cool, BSN, RN, CWS, [name] RN, BSN Entered By: Gretta Cool, BSN, RN, CWS, [name] on 01/04/2022 15:34:03 Laura Santiago ([id number]) -------------------------------------------------------------------------------- Wound Assessment Details Patient Name: Laura Santiago, Laura Santiago. Date of Service: 01/04/2022 2:00 PM Medical Record Number: [id number] Patient Account Number: [id number] Date of Birth/Sex: [birth year] (76 y.o. Female) Treating RN: Cornell Barman Primary Care Kennedy Bohanon: Orvis Brill, DOCTORS Other Clinician: Referring Sue Mcalexander: Orvis Brill, DOCTORS Treating Marlon Suleiman/Extender: Yaakov Guthrie in Treatment: 26 Wound Status Wound Number: 1 Primary Venous Leg Ulcer Etiology: Wound Location: Left, Medial Lower Leg Wound Status: Open Wounding Event: Gradually Appeared Comorbid Hypertension, Osteoarthritis, Received Chemotherapy, Date Acquired: 04/06/2021 History: Received Radiation Weeks Of Treatment: 26 Clustered Wound: Yes Photos Photo Uploaded By: Gretta Cool, BSN, RN, CWS, [name] on 01/04/2022 15:53:09 Wound Measurements Length: (cm) 10.5 Width: (cm) 11 Depth: (cm) 0.5 Area: (cm) 90.713 Volume: (cm) 45.357 % Reduction in Area: -0.4% % Reduction in Volume: -151.1% Epithelialization: None Tunneling: No Undermining: No Wound Description Classification: Full Thickness Without Exposed Support Structures Wound Margin: Flat and Intact Exudate Amount: Large Exudate Type: Serosanguineous Exudate Color: red, brown Foul Odor After Cleansing: No Slough/Fibrino Yes Wound Bed Granulation Amount: Small (1-33%) Exposed Structure Granulation Quality: Red Fascia Exposed: No Necrotic Amount: Medium (34-66%) Fat Layer (Subcutaneous Tissue) Exposed: Yes Necrotic Quality: Adherent Slough Tendon Exposed: No Muscle Exposed: No Joint Exposed: No Bone Exposed: No Treatment Notes Wound #1 (Lower Leg) Wound Laterality:  Left, Medial Cleanser Soap and Water Discharge Instruction: Gently cleanse wound with antibacterial soap, rinse and pat dry prior to dressing wounds Laura Santiago, Laura Santiago.  ([id number]) Peri-Wound Care Topical Gentamicin Discharge Instruction: Apply as directed by Ambri Miltner. Today (12/28/21) in office only Primary Dressing Gauze Discharge Instruction: As directed: dry, moistened with saline or moistened with Dakins Solution to start 2 x daily 12/29/21 Secondary Dressing ABD Pad 5x9 (in/in) Discharge Instruction: Cover with ABD pad Secured With 35M ACE Elastic Bandage With VELCRO Brand Closure, 4 (in) Compression Wrap Compression Stockings Add-Ons Electronic Signature(s) Signed: 01/04/2022 4:40:14 PM By: Gretta Cool, BSN, RN, CWS, [name] RN, BSN Entered By: Gretta Cool, BSN, RN, CWS, [name] on 01/04/2022 14:38:09 Laura Santiago ([id number]) -------------------------------------------------------------------------------- Wound Assessment Details Patient Name: Laura Santiago, Laura Santiago. Date of Service: 01/04/2022 2:00 PM Medical Record Number: [id number] Patient Account Number: [id number] Date of Birth/Sex: January 04, 1946 (76 y.o. 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 Toe Great Wound Status: Open Wounding Event: Gradually Appeared Comorbid Hypertension, Osteoarthritis, Received Chemotherapy, Date Acquired: 08/24/2021 History: Received Radiation Weeks Of Treatment: 19 Clustered Wound: No Photos Photo Uploaded By: Gretta Cool, BSN, RN, CWS, [name] on 01/04/2022 15:53:39 Wound Measurements Length: (cm) 0.7 Width: (cm) 2 Depth: (cm) 0.2 Area: (cm) 1.1 Volume: (cm) 0.22 % Reduction in Area: -600.6% % Reduction in Volume: -249.2% Epithelialization: None Wound Description Classification: Full Thickness Without Exposed Support Structures Wound Margin: Indistinct, nonvisible Exudate Amount: Medium Exudate Type: Serous Exudate  Color: amber Foul Odor After Cleansing: No Slough/Fibrino Yes Wound Bed Granulation Amount: Medium (34-66%) Exposed Structure Granulation Quality: Pink Fascia Exposed: No Necrotic Amount: Medium (34-66%) Fat Layer (Subcutaneous Tissue) Exposed: Yes Tendon Exposed: No Muscle Exposed: No Joint Exposed: No Bone Exposed: No Treatment Notes Wound #2 (Toe Great) Wound Laterality: Right Cleanser Wound Cleanser Discharge Instruction: Wash your hands with soap and water. Remove old dressing, discard into plastic bag and place into trash. Cleanse the wound with Wound Cleanser prior to applying a clean dressing using gauze sponges, not tissues or cotton balls. Do not Laura Santiago, Laura Santiago. ([id number]) scrub or use excessive force. Pat dry using gauze sponges, not tissue or cotton balls. Peri-Wound Care Topical Primary Dressing Silvercel Small 2x2 (in/in) Discharge Instruction: Apply Silvercel Small 2x2 (in/in) as instructed Secondary Dressing Gauze Discharge Instruction: Cover with dry gauze Secured With 35M [place] Surgical Tape, 2x2 (in/yd) Discharge Instruction: Secure dressing Compression Wrap Compression Stockings Add-Ons Electronic Signature(s) Signed: 01/04/2022 4:40:14 PM By: Gretta Cool, BSN, RN, CWS, [name] RN, BSN Entered By: Gretta Cool, BSN, RN, CWS, [name] on 01/04/2022 15:12:29 Laura Santiago, Laura Santiago ([id number]) -------------------------------------------------------------------------------- Vitals Details Patient Name: Laura Santiago. Date of Service: 01/04/2022 2:00 PM Medical Record Number: [id number] Patient Account Number: [id number] Date of Birth/Sex: [birth year] (76 y.o. Female) Treating RN: Cornell Barman Primary Care Nayla Dias: Orvis Brill, DOCTORS Other Clinician: Referring Ahmari Duerson: Orvis Brill, DOCTORS Treating Juhi Lagrange/Extender: Yaakov Guthrie in Treatment: 26 Vital Signs Time Taken: 14:27 Temperature (F): 98.6 Height (in): 66 Pulse (bpm): 92 Weight (lbs):  153 Respiratory Rate (breaths/min): 18 Body Mass Index (BMI): 24.7 Blood Pressure (mmHg): 157/83 Reference Range: 80 - 120 mg / dl Electronic Signature(s) Signed: 01/04/2022 4:40:14 PM By: Gretta Cool, BSN, RN, CWS, [name] RN, BSN Entered By: Gretta Cool, BSN, RN, CWS, [name] on 01/04/2022 14:28:31

## 2022-01-10 ENCOUNTER — Inpatient Hospital Stay: Payer: Medicare Other

## 2022-01-10 ENCOUNTER — Other Ambulatory Visit: Payer: Self-pay | Admitting: *Deleted

## 2022-01-10 ENCOUNTER — Telehealth: Payer: Self-pay | Admitting: Oncology

## 2022-01-10 DIAGNOSIS — I129 Hypertensive chronic kidney disease with stage 1 through stage 4 chronic kidney disease, or unspecified chronic kidney disease: Secondary | ICD-10-CM | POA: Insufficient documentation

## 2022-01-10 DIAGNOSIS — N184 Chronic kidney disease, stage 4 (severe): Secondary | ICD-10-CM | POA: Insufficient documentation

## 2022-01-10 MED ORDER — OXYCODONE HCL 10 MG PO TABS: 10.0000 mg | ORAL_TABLET | ORAL | 0 refills | Status: DC | PRN

## 2022-01-10 NOTE — Telephone Encounter (Signed)
Ok to move

## 2022-01-10 NOTE — Telephone Encounter (Signed)
Laura Santiago called and spoke to pt. She is transitioning from SNF to going back to [facility]. Appts made and pt knows

## 2022-01-10 NOTE — Telephone Encounter (Signed)
Pt called to reschedule her appt for today.call back at [phone]

## 2022-01-11 ENCOUNTER — Encounter: Payer: Medicare Other | Admitting: Internal Medicine

## 2022-01-14 DIAGNOSIS — Z7901 Long term (current) use of anticoagulants: Secondary | ICD-10-CM | POA: Insufficient documentation

## 2022-01-18 ENCOUNTER — Other Ambulatory Visit: Payer: Self-pay

## 2022-01-18 ENCOUNTER — Encounter: Payer: Medicare Other | Attending: Internal Medicine | Admitting: Internal Medicine

## 2022-01-18 DIAGNOSIS — I129 Hypertensive chronic kidney disease with stage 1 through stage 4 chronic kidney disease, or unspecified chronic kidney disease: Secondary | ICD-10-CM | POA: Diagnosis not present

## 2022-01-18 DIAGNOSIS — L97822 Non-pressure chronic ulcer of other part of left lower leg with fat layer exposed: Secondary | ICD-10-CM | POA: Diagnosis present

## 2022-01-18 DIAGNOSIS — S81802A Unspecified open wound, left lower leg, initial encounter: Secondary | ICD-10-CM | POA: Diagnosis not present

## 2022-01-18 DIAGNOSIS — I87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: Secondary | ICD-10-CM | POA: Insufficient documentation

## 2022-01-18 DIAGNOSIS — X58XXXA Exposure to other specified factors, initial encounter: Secondary | ICD-10-CM | POA: Insufficient documentation

## 2022-01-18 DIAGNOSIS — N184 Chronic kidney disease, stage 4 (severe): Secondary | ICD-10-CM | POA: Insufficient documentation

## 2022-01-18 DIAGNOSIS — C79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: Secondary | ICD-10-CM | POA: Diagnosis not present

## 2022-01-18 DIAGNOSIS — S91201A Unspecified open wound of right great toe with damage to nail, initial encounter: Secondary | ICD-10-CM | POA: Diagnosis not present

## 2022-01-18 DIAGNOSIS — S91104A Unspecified open wound of right lesser toe(s) without damage to nail, initial encounter: Secondary | ICD-10-CM | POA: Diagnosis not present

## 2022-01-18 DIAGNOSIS — Z7901 Long term (current) use of anticoagulants: Secondary | ICD-10-CM | POA: Diagnosis not present

## 2022-01-18 DIAGNOSIS — I872 Venous insufficiency (chronic) (peripheral): Secondary | ICD-10-CM | POA: Diagnosis not present

## 2022-01-18 DIAGNOSIS — S91101A Unspecified open wound of right great toe without damage to nail, initial encounter: Secondary | ICD-10-CM | POA: Diagnosis not present

## 2022-01-18 DIAGNOSIS — F909 Attention-deficit hyperactivity disorder, unspecified type: Secondary | ICD-10-CM | POA: Insufficient documentation

## 2022-01-18 NOTE — Progress Notes (Signed)
DORIAN, RENFRO ([id number]) Visit Report for 01/18/2022 Chief Complaint Document Details Patient Name: NANEA, Laura Santiago. Date of Service: 01/18/2022 9:00 AM Medical Record Number: [id number] Patient Account Number: [id number] Date of Birth/Sex: [birth year] (76 y.o. F) Treating RN: Levora Dredge Primary Care Provider: Orvis Brill, DOCTORS Other Clinician: Referring Provider: Orvis Brill, DOCTORS Treating Provider/Extender: Yaakov Guthrie in Treatment: 28 Information Obtained from: Patient Chief Complaint Left lower extremity wound Right toe wounds Left upper lateral thigh wounds Electronic Signature(s) Signed: 01/18/2022 12:33:19 PM By: Kalman Shan DO Entered By: Kalman Shan on 01/18/2022 12:22:22 Laura Santiago ([id number]) -------------------------------------------------------------------------------- Debridement Details Patient Name: Laura Santiago. Date of Service: 01/18/2022 9:00 AM Medical Record Number: [id number] Patient Account Number: [id number] Date of Birth/Sex: May 28, 1946 (76 y.o. F) Treating RN: Levora Dredge Primary Care Provider: Orvis Brill, DOCTORS Other Clinician: Referring Provider: Orvis Brill, DOCTORS Treating Provider/Extender: Yaakov Guthrie in Treatment: 28 Debridement Performed for Wound #1 Left,Medial Lower Leg Assessment: Performed By: Physician Kalman Shan, MD Debridement Type: Debridement Severity of Tissue Pre Debridement: Fat layer exposed Level of Consciousness (Pre- Awake and Alert procedure): Pre-procedure Verification/Time Out Yes - 10:03 Taken: Total Area Debrided (L x W): 9 (cm) x 8 (cm) = 72 (cm) Tissue and other material Non-Viable, Slough, Subcutaneous, Slough debrided: Level: Skin/Subcutaneous Tissue Debridement Description: Excisional Instrument: Curette Bleeding: Minimum Hemostasis Achieved: Pressure Response to Treatment: Procedure was tolerated well Level of Consciousness (Post- Awake and  Alert procedure): Post Debridement Measurements of Total Wound Length: (cm) 9 Width: (cm) 8 Depth: (cm) 0.1 Volume: (cm) 5.655 Character of Wound/Ulcer Post Debridement: Stable Severity of Tissue Post Debridement: Fat layer exposed Post Procedure Diagnosis Same as Pre-procedure Electronic Signature(s) Signed: 01/18/2022 12:33:19 PM By: Kalman Shan DO Signed: 01/18/2022 4:45:40 PM By: Levora Dredge Entered By: Levora Dredge on 01/18/2022 10:04:29 Laura Santiago ([id number]) -------------------------------------------------------------------------------- HPI Details Patient Name: Laura Santiago. Date of Service: 01/18/2022 9:00 AM Medical Record Number: [id number] Patient Account Number: [id number] Date of Birth/Sex: 1946-01-29 (76 y.o. F) Treating RN: Levora Dredge Primary Care Provider: Priscille Kluver Other Clinician: Referring Provider: Orvis Brill, DOCTORS Treating Provider/Extender: Yaakov Guthrie in Treatment: 28 History of Present Illness HPI Description: Admission 7/27 Ms. Cherish Runde is a 76 year old female with a past medical history of ADHD, metastatic breast cancer, stage IV chronic kidney disease, history of DVT on Xarelto and chronic venous insufficiency that presents to the clinic for a chronic left lower extremity wound. She recently moved to [facility] 4 days ago. She was being followed by wound care center in [name]. She reports a 10-year history of wounds to her left lower extremity that eventually do heal with debridement and compression therapy. She states that the current wound reopened 4 months ago and she is using Vaseline and Coban. She denies signs of infection. 8/3; patient presents for 1 week follow-up. She reports no issues or complaints today. She states she had vascular studies done in the last week. She denies signs of infection. She brought her little service dog with her today. 8/17; patient presents for  follow-up. She has missed her last clinic appointment. She states she took the wrap off and attempted to rewrap her leg. She is having difficulty with transportation. She has her service dog with her today. Overall she feels well and reports improvement in wound healing. She denies signs of infection. She reports owning an old Velcro wrap compression and has this at her living facility 9/14; patient presents for follow-up. Patient states that over the past 2 to 3  weeks she developed toe wounds to her right foot. She attributes this to tight fitting shoes. She subsequently developed cellulitis in the right leg and has been treated by doxycycline by her oncologist. She reports improvement in symptoms however continues to have some redness and swelling to this leg. To the left lower extremity patient has been having her wraps changed with home health twice weekly. She states that the [facility] is not helping control the drainage. Other than that she has no issues or complaints today. She denies signs of infection to the left lower extremity. 9/21; patient presents for follow-up. She reports seeing infectious disease for her cellulitis. She reports no further management. She has home health that changes the wraps twice weekly. She has no issues or complaints today. She denies signs of infection. 10/5; patient presents for follow-up. She has no issues or complaints today. She denies signs of infection. She states that the right great toe has not been dressed by home health. 10/12; patient presents for follow-up. She has no issues or complaints today. She reports improvement in her wound healing. She has been using silver alginate to the right great toe wound. She denies signs of infection. 10/26; patient presents for follow-up. Home health did not have sorbact so they continued to use Hydrofera Blue under the wrap. She has been using silver alginate to the great toe wound however she did not have a  dressing in place today. She currently denies signs of infection. 11/2; patient presents for follow-up. She has been using sorb act under the compression wrap. She reports using silver alginate to the toe wound again she does not have a dressing in place. She currently denies signs of infection. 11/23; patient presents for follow-up. Unfortunately she has missed her last 2 clinic appointments. She was last seen 3 weeks ago. She did her own compression wrap with Kerlix and Coban yesterday after seeing vein and vascular. She has not been dressing her right great toe wound. She currently denies signs of infection. 11/30; patient presents for 1 week follow-up. She states she changed her dressing last week prior to home health and use sorb act with Dakin's and Hydrofera Blue. Home health has changed the dressing as well and they have been using sorbact. Today she reports increased redness to her right lower extremity. She has a history of cellulitis to this leg. She has been using silver alginate to the right great toe. Unfortunately she had an episode of diarrhea prior to coming in and had feces all over the right leg and to the wrap of her left leg. 12/7; patient presents for 1 week follow-up. She states that home health did not come out to change the dressing and she took it off yesterday. It is unclear if she is dressing the right toe wound. She denies signs of infection. 12/14; patient presents for 1 week follow-up. She has no issues or complaints today. 12/21; patient presents for follow-up. She has no issues or complaints today. She denies signs of infection. 12/28/2021; patient presents for follow-up. She was hospitalized for sepsis secondary to right lower extremity cellulitis On 12/23. She states she is currently at a SNF. She states that she was started on doxycycline this morning for her right great toe swelling and redness. She is not sure what dressings have been done to her left lower  extremity for the past 3 weeks. She says its been mainly gauze with an Ace wrap. 1/25; patient presents for follow-up. She is still  residing in a skilled nursing facility. She reports mild pain to the left lower extremity wound bed. She states she is going to see a podiatrist soon. 2/8; patient presents for follow-up. She has moved back to her residential community from her skilled nursing facility. She has no issues or complaints today. She denies signs of systemic infections. Electronic Signature(s) Signed: 01/18/2022 12:33:19 PM By: Jake Shark ([id number]) Entered By: Kalman Shan on 01/18/2022 12:23:39 ARYANI, DAFFERN ([id number]) -------------------------------------------------------------------------------- Physical Exam Details Patient Name: Laura Santiago, Laura Santiago. Date of Service: 01/18/2022 9:00 AM Medical Record Number: [id number] Patient Account Number: [id number] Date of Birth/Sex: 1946/05/30 (76 y.o. F) Treating RN: Levora Dredge Primary Care Provider: Orvis Brill, DOCTORS Other Clinician: Referring Provider: HOUSECALLS, DOCTORS Treating Provider/Extender: Yaakov Guthrie in Treatment: 28 Constitutional . Cardiovascular . Psychiatric . Notes Left lower extremity: Large open wound to the medial aspect with nonviable tissue and granulation tissue present. No surrounding signs of infection. Right foot: To the right great toe there is epithelialization to the previous wound site. No drainage noted On palpation. Electronic Signature(s) Signed: 01/18/2022 12:33:19 PM By: Kalman Shan DO Entered By: Kalman Shan on 01/18/2022 12:24:36 Laura Santiago ([id number]) -------------------------------------------------------------------------------- Physician Orders Details Patient Name: Laura Santiago. Date of Service: 01/18/2022 9:00 AM Medical Record Number: [id number] Patient Account Number: [id number] Date of Birth/Sex: Aug 09, 1946 (76  y.o. F) Treating RN: Levora Dredge Primary Care Provider: Orvis Brill, DOCTORS Other Clinician: Referring Provider: Orvis Brill, DOCTORS Treating Provider/Extender: Yaakov Guthrie in Treatment: 39 Verbal / Phone Orders: No Diagnosis Coding Follow-up Appointments o Return Appointment in 2 weeks. o Nurse Visit as needed [place] for wound care. May utilize formulary equivalent dressing for wound treatment orders unless otherwise specified. Home Health Nurse may visit PRN to address patientos wound care needs. o **Please direct any NON-WOUND related issues/requests for orders to patient's Primary Care Physician. **If current dressing causes regression in wound condition, may D/C ordered dressing product/s and apply Normal Saline Moist Dressing daily until next Max or Other MD appointment. **Notify [facility] of regression in wound condition at [phone]. o Other Home Health Orders/Instructions: - Dressing change 3 x weekly, once by wound care and once at wound clinic weekly Bathing/ Shower/ Hygiene o May shower with wound dressing protected with water repellent cover or cast protector. o No tub bath. Anesthetic (Use 'Patient Medications' Section for Anesthetic Order Entry) o Lidocaine applied to wound bed Edema Control - Lymphedema / Segmental Compressive Device / Other o Elevate leg(s) parallel to the floor when sitting. o DO YOUR BEST to sleep in the bed at night. DO NOT sleep in your recliner. Long hours of sitting in a recliner leads to swelling of the legs and/or potential wounds on your backside. Additional Orders / Instructions o Follow Nutritious Diet and Increase Protein Intake Wound Treatment Wound #1 - Lower Leg Wound Laterality: Left, Medial Cleanser: Soap and Water (Home Health) (Generic) 3 x Per Week/30 Days Discharge Instructions: Gently cleanse wound  with antibacterial soap, rinse and pat dry prior to dressing wounds Topical: Gentamicin 3 x Per Week/30 Days Discharge Instructions: Apply as directed by provider. Apply to wound bed with ketoconazole Topical: Triamcinolone Acetonide Cream, 0.1%, 15 (g) tube 3 x Per Week/30 Days Discharge Instructions: Apply as directed by provider. Apply to leg Topical: Ketoconazole Cream 2%, 30 (g), tube 3 x Per Week/30 Days Discharge Instructions: apply 1:1 ratio with gentamicin, apply  to wound bed. Nystatin in office Primary Dressing: Gauze 3 x Per Week/30 Days Discharge Instructions: As directed: dry Secondary Dressing: Zetuvit Plus Silicone Border Dressing 4x4 (in/in) 3 x Per Week/30 Days Secured With: Coban Cohesive Bandage 4x5 (yds) Stretched 3 x Per Week/30 Days Discharge Instructions: Apply Coban as directed. Secured With: [employer] or Non-Sterile 6-ply 4.5x4 (yd/yd) 3 x Per Week/30 Days Discharge Instructions: Apply Kerlix as directed MAKAYIA, DUPLESSIS ([id number]) Patient Medications Allergies: Celebrex Notifications Medication Indication Start End ketoconazole 01/18/2022 DOSE 1 - topical 2 % cream - apply with dressing changes gentamicin 01/18/2022 DOSE 1 - topical 0.1 % cream - apply with dressing changes Electronic Signature(s) Signed: 01/18/2022 12:33:19 PM By: Kalman Shan DO Previous Signature: 01/18/2022 12:32:11 PM Version By: Kalman Shan DO Entered By: Kalman Shan on 01/18/2022 12:32:39 Laura Santiago ([id number]) -------------------------------------------------------------------------------- Problem List Details Patient Name: Laura Santiago. Date of Service: 01/18/2022 9:00 AM Medical Record Number: [id number] Patient Account Number: [id number] Date of Birth/Sex: 09-01-1946 (76 y.o. F) Treating RN: Levora Dredge Primary Care Provider: Orvis Brill, DOCTORS Other Clinician: Referring Provider: HOUSECALLS, DOCTORS Treating Provider/Extender: Yaakov Guthrie in Treatment: 28 Active Problems ICD-10 Encounter Code Description Active Date MDM Diagnosis L97.822 Non-pressure chronic ulcer of other part of left lower leg with fat layer 11/02/2021 No Yes exposed I87.312 Chronic venous hypertension (idiopathic) with ulcer of left lower 11/02/2021 No Yes extremity S91.201D Unspecified open wound of right great toe with damage to nail, 08/31/2021 No Yes subsequent encounter I87.2 Venous insufficiency (chronic) (peripheral) 07/06/2021 No Yes Z79.01 Long term (current) use of anticoagulants 07/06/2021 No Yes I10 Essential (primary) hypertension 07/06/2021 No Yes 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 07/06/2021 No Yes Inactive Problems ICD-10 Code Description Active Date Inactive Date S81.802A Unspecified open wound, left lower leg, initial encounter 07/06/2021 07/06/2021 S91.101A Unspecified open wound of right great toe without damage to nail, initial 08/24/2021 08/24/2021 encounter S91.104A Unspecified open wound of right lesser toe(s) without damage to nail, initial 08/24/2021 08/24/2021 encounter Resolved Problems ICD-10 JENESIS, MARTIN ([id number]) Code Description Active Date Resolved Date S91.104D Unspecified open wound of right lesser toe(s) without damage to nail, 08/31/2021 08/31/2021 subsequent encounter Electronic Signature(s) Signed: 01/18/2022 12:33:19 PM By: Kalman Shan DO Entered By: Kalman Shan on 01/18/2022 12:22:15 Laura Santiago ([id number]) -------------------------------------------------------------------------------- Progress Note Details Patient Name: Laura Santiago. Date of Service: 01/18/2022 9:00 AM Medical Record Number: [id number] Patient Account Number: [id number] Date of Birth/Sex: Dec 17, 1945 (76 y.o. F) Treating RN: Levora Dredge Primary Care Provider: Priscille Kluver Other Clinician: Referring Provider: Orvis Brill, DOCTORS Treating Provider/Extender: Yaakov Guthrie in  Treatment: 28 Subjective Chief Complaint Information obtained from Patient Left lower extremity wound Right toe wounds Left upper lateral thigh wounds History of Present Illness (HPI) Admission 7/27 Ms. Laura Santiago is a 76 year old female with a past medical history of ADHD, metastatic breast cancer, stage IV chronic kidney disease, history of DVT on Xarelto and chronic venous insufficiency that presents to the clinic for a chronic left lower extremity wound. She recently moved to [facility] 4 days ago. She was being followed by wound care center in [name]. She reports a 10-year history of wounds to her left lower extremity that eventually do heal with debridement and compression therapy. She states that the current wound reopened 4 months ago and she is using Vaseline and Coban. She denies signs of infection. 8/3; patient presents for 1 week follow-up. She reports no issues or complaints today. She states she had vascular studies  done in the last week. She denies signs of infection. She brought her little service dog with her today. 8/17; patient presents for follow-up. She has missed her last clinic appointment. She states she took the wrap off and attempted to rewrap her leg. She is having difficulty with transportation. She has her service dog with her today. Overall she feels well and reports improvement in wound healing. She denies signs of infection. She reports owning an old Velcro wrap compression and has this at her living facility 9/14; patient presents for follow-up. Patient states that over the past 2 to 3 weeks she developed toe wounds to her right foot. She attributes this to tight fitting shoes. She subsequently developed cellulitis in the right leg and has been treated by doxycycline by her oncologist. She reports improvement in symptoms however continues to have some redness and swelling to this leg. To the left lower extremity patient has been having her wraps  changed with home health twice weekly. She states that the [facility] is not helping control the drainage. Other than that she has no issues or complaints today. She denies signs of infection to the left lower extremity. 9/21; patient presents for follow-up. She reports seeing infectious disease for her cellulitis. She reports no further management. She has home health that changes the wraps twice weekly. She has no issues or complaints today. She denies signs of infection. 10/5; patient presents for follow-up. She has no issues or complaints today. She denies signs of infection. She states that the right great toe has not been dressed by home health. 10/12; patient presents for follow-up. She has no issues or complaints today. She reports improvement in her wound healing. She has been using silver alginate to the right great toe wound. She denies signs of infection. 10/26; patient presents for follow-up. Home health did not have sorbact so they continued to use Hydrofera Blue under the wrap. She has been using silver alginate to the great toe wound however she did not have a dressing in place today. She currently denies signs of infection. 11/2; patient presents for follow-up. She has been using sorb act under the compression wrap. She reports using silver alginate to the toe wound again she does not have a dressing in place. She currently denies signs of infection. 11/23; patient presents for follow-up. Unfortunately she has missed her last 2 clinic appointments. She was last seen 3 weeks ago. She did her own compression wrap with Kerlix and Coban yesterday after seeing vein and vascular. She has not been dressing her right great toe wound. She currently denies signs of infection. 11/30; patient presents for 1 week follow-up. She states she changed her dressing last week prior to home health and use sorb act with Dakin's and Hydrofera Blue. Home health has changed the dressing as well and they  have been using sorbact. Today she reports increased redness to her right lower extremity. She has a history of cellulitis to this leg. She has been using silver alginate to the right great toe. Unfortunately she had an episode of diarrhea prior to coming in and had feces all over the right leg and to the wrap of her left leg. 12/7; patient presents for 1 week follow-up. She states that home health did not come out to change the dressing and she took it off yesterday. It is unclear if she is dressing the right toe wound. She denies signs of infection. 12/14; patient presents for 1 week follow-up. She has no  issues or complaints today. 12/21; patient presents for follow-up. She has no issues or complaints today. She denies signs of infection. 12/28/2021; patient presents for follow-up. She was hospitalized for sepsis secondary to right lower extremity cellulitis On 12/23. She states she is currently at a SNF. She states that she was started on doxycycline this morning for her right great toe swelling and redness. She is not sure what dressings have been done to her left lower extremity for the past 3 weeks. She says its been mainly gauze with an Ace wrap. 1/25; patient presents for follow-up. She is still residing in a skilled nursing facility. She reports mild pain to the left lower extremity wound bed. She states she is going to see a podiatrist soon. 2/8; patient presents for follow-up. She has moved back to her residential community from her skilled nursing facility. She has no issues or complaints today. She denies signs of systemic infections. NIMA, BAMBURG ([id number]) Objective Constitutional Vitals Time Taken: 9:12 AM, Height: 66 in, Weight: 153 lbs, BMI: 24.7, Temperature: 98 F, Pulse: 90 bpm, Respiratory Rate: 18 breaths/min, Blood Pressure: 105/72 mmHg. General Notes: Left lower extremity: Large open wound to the medial aspect with nonviable tissue and granulation tissue present.  No surrounding signs of infection. Right foot: To the right great toe there is epithelialization to the previous wound site. No drainage noted On palpation. Integumentary (Hair, Skin) Wound #1 status is Open. Original cause of wound was Gradually Appeared. The date acquired was: 04/06/2021. The wound has been in treatment 28 weeks. The wound is located on the Left,Medial Lower Leg. The wound measures 9cm length x 8cm width x 0.1cm depth; 56.549cm^2 area and 5.655cm^3 volume. There is Fat Layer (Subcutaneous Tissue) exposed. There is no tunneling or undermining noted. There is a large amount of serosanguineous drainage noted. The wound margin is flat and intact. There is small (1-33%) red, pink granulation within the wound bed. There is a medium (34-66%) amount of necrotic tissue within the wound bed including Adherent Slough. Wound #2 status is Healed - Epithelialized. Original cause of wound was Gradually Appeared. The date acquired was: 08/24/2021. The wound has been in treatment 21 weeks. The wound is located on the Right Toe Great. The wound measures 0cm length x 0cm width x 0cm depth; 0cm^2 area and 0cm^3 volume. There is no tunneling or undermining noted. There is a none present amount of drainage noted. The wound margin is indistinct and nonvisible. There is no granulation within the wound bed. There is a large (67-100%) amount of necrotic tissue within the wound bed including Eschar. Assessment Active Problems ICD-10 Non-pressure chronic ulcer of other part of left lower leg with fat layer exposed Chronic venous hypertension (idiopathic) with ulcer of left lower extremity Unspecified open wound of right great toe with damage to nail, subsequent encounter Venous insufficiency (chronic) (peripheral) Long term (current) use of anticoagulants Essential (primary) hypertension Secondary malignant neoplasm of breast Patient's right great toe has healed. Her left lower extremity wound has  improved in appearance. She no longer has pain. I debrided nonviable tissue. No signs of surrounding infection. I recommended at this time continuing to use gentamicin ointment under Kerlix/Coban. I also recommended adding ketoconazole to address any fungal issues. Follow-up in 1 week. 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 There was a Excisional Skin/Subcutaneous Tissue Debridement with a total area  of 72 sq cm performed by Kalman Shan, MD. With the following instrument(s): Curette to remove Non-Viable tissue/material. Material removed includes Subcutaneous Tissue and Slough and. No specimens were taken. A time out was conducted at 10:03, prior to the start of the procedure. A Minimum amount of bleeding was controlled with Pressure. The procedure was tolerated well. Post Debridement Measurements: 9cm length x 8cm width x 0.1cm depth; 5.655cm^3 volume. Character of Wound/Ulcer Post Debridement is stable. Severity of Tissue Post Debridement is: Fat layer exposed. Post procedure Diagnosis Wound #1: Same as Pre-Procedure BROOKE, STEINHILBER ([id number]) Plan Follow-up Appointments: Return Appointment in 2 weeks. Nurse Visit as needed Home Health: [facility]: - [facility] for wound care. May utilize formulary equivalent dressing for wound treatment orders unless otherwise specified. Home Health Nurse may visit PRN to address patient s wound care needs. **Please direct any NON-WOUND related issues/requests for orders to patient's Primary Care Physician. **If current dressing causes regression in wound condition, may D/C ordered dressing product/s and apply Normal Saline Moist Dressing daily until next [place] or Other MD appointment. **Notify [facility] of regression in wound condition at [phone]. Other  Home Health Orders/Instructions: - Dressing change 3 x weekly, once by wound care and once at wound clinic weekly Bathing/ Shower/ Hygiene: May shower with wound dressing protected with water repellent cover or cast protector. No tub bath. Anesthetic (Use 'Patient Medications' Section for Anesthetic Order Entry): Lidocaine applied to wound bed Edema Control - Lymphedema / Segmental Compressive Device / Other: Elevate leg(s) parallel to the floor when sitting. DO YOUR BEST to sleep in the bed at night. DO NOT sleep in your recliner. Long hours of sitting in a recliner leads to swelling of the legs and/or potential wounds on your backside. Additional Orders / Instructions: Follow Nutritious Diet and Increase Protein Intake WOUND #1: - Lower Leg Wound Laterality: Left, Medial Cleanser: Soap and Water (Home Health) (Generic) 3 x Per Week/30 Days Discharge Instructions: Gently cleanse wound with antibacterial soap, rinse and pat dry prior to dressing wounds Topical: Gentamicin 3 x Per Week/30 Days Discharge Instructions: Apply as directed by provider. Apply to wound bed with ketoconazole Topical: Triamcinolone Acetonide Cream, 0.1%, 15 (g) tube 3 x Per Week/30 Days Discharge Instructions: Apply as directed by provider. Apply to leg Topical: Ketoconazole Cream 2%, 30 (g), tube 3 x Per Week/30 Days Discharge Instructions: apply 1:1 ratio with gentamicin, apply to wound bed. Nystatin in office Primary Dressing: Gauze 3 x Per Week/30 Days Discharge Instructions: As directed: dry Secondary Dressing: Zetuvit Plus Silicone Border Dressing 4x4 (in/in) 3 x Per Week/30 Days Secured With: Coban Cohesive Bandage 4x5 (yds) Stretched 3 x Per Week/30 Days Discharge Instructions: Apply Coban as directed. Secured With: [employer] or Non-Sterile 6-ply 4.5x4 (yd/yd) 3 x Per Week/30 Days Discharge Instructions: Apply Kerlix as directed 1. In office sharp debridement 2. Ketoconazole and gentamicin under  Kerlix/Coban 3. Follow-up in 1 week Electronic Signature(s) Signed: 01/18/2022 12:33:19 PM By: Kalman Shan DO Entered By: Kalman Shan on 01/18/2022 12:25:59 Laura Santiago ([id number]) -------------------------------------------------------------------------------- [place] Details Patient Name: Laura Santiago. Date of Service: 01/18/2022 Medical Record Number: [id number] Patient Account Number: [id number] Date of Birth/Sex: 1946/02/25 (76 y.o. 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 S91.201D Unspecified open wound of right great toe with damage to nail, subsequent encounter I87.2 Venous insufficiency (chronic) (peripheral) Z79.01 Long term (current) use of anticoagulants I10 Essential (primary) hypertension 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 Facility Procedures CPT4 Code: 71245809 Description: 98338 - DEB SUBQ TISSUE 20 SQ CM/< Modifier: Quantity: 1 CPT4 Code: Description: ICD-10 Diagnosis Description L97.822 Non-pressure chronic ulcer of other part of left lower leg with fat layer Modifier: exposed Quantity: CPT4 Code: 25053976 Description: 73419 - DEB SUBQ TISS EA ADDL 20CM Modifier: Quantity: 3 CPT4 Code: Description: ICD-10 Diagnosis Description L97.822 Non-pressure chronic ulcer of other part of left lower leg with fat layer Modifier: exposed Quantity: Physician Procedures CPT4 Code: 3790240 Description: 97353 - WC PHYS SUBQ TISS 20 SQ CM Modifier: Quantity: 1 CPT4 Code: Description: ICD-10 Diagnosis Description L97.822 Non-pressure chronic ulcer of other part of  left lower leg with fat layer Modifier: exposed Quantity: CPT4 Code: 2992426 Description: 83419 - WC PHYS SUBQ TISS EA ADDL 20 CM Modifier: Quantity: 3 CPT4 Code: Description: ICD-10 Diagnosis Description L97.822 Non-pressure chronic ulcer of other part of left lower leg with fat layer Modifier: exposed Quantity: Electronic Signature(s) Signed: 01/18/2022 3:04:18 PM By: Levora Dredge Signed: 01/18/2022 4:30:46 PM By: Kalman Shan DO Previous Signature: 01/18/2022 12:33:19 PM Version By: Kalman Shan DO Entered By: Levora Dredge on 01/18/2022 15:04:18

## 2022-01-18 NOTE — Progress Notes (Signed)
Laura Santiago ([id number]) Visit Report for 01/18/2022 Arrival Information Details Patient Name: Laura Santiago. Date of Service: 01/18/2022 9:00 AM Medical Record Number: [id number] Patient Account Number: [id number] Date of Birth/Sex: 08/08/1946 (76 y.o. F) Treating RN: Levora Dredge Primary Care Farah Lepak: Orvis Brill, DOCTORS Other Clinician: Referring Joshual Terrio: HOUSECALLS, DOCTORS Treating Svea Pusch/Extender: Yaakov Guthrie in Treatment: 28 Visit Information History Since Last Visit Added or deleted any medications: No Patient Arrived: [place] Any new allergies or adverse reactions: No Arrival Time: 09:12 Had a fall or experienced change in No Accompanied By: self activities of daily living that may affect Transfer Assistance: None risk of falls: Patient Identification Verified: Yes Hospitalized since last visit: No Secondary Verification Process Completed: Yes Has Dressing in Place as Prescribed: Yes Patient Has Alerts: Yes Pain Present Now: No Patient Alerts: PT HAS SERVICE ANIMAL ABI 07/11/21 R) 1.16 L) 1.27 Electronic Signature(s) Signed: 01/18/2022 4:45:40 PM By: Levora Dredge Entered By: Levora Dredge on 01/18/2022 09:14:17 Laura Santiago ([id number]) -------------------------------------------------------------------------------- Clinic Level of Care Assessment Details Patient Name: Laura Santiago. Date of Service: 01/18/2022 9:00 AM Medical Record Number: [id number] Patient Account Number: [id number] Date of Birth/Sex: 29-Feb-1946 (76 y.o. F) Treating RN: Levora Dredge Primary Care Winnifred Dufford: Orvis Brill, DOCTORS Other Clinician: Referring Ronith Berti: Orvis Brill, DOCTORS Treating Dolton Shaker/Extender: Yaakov Guthrie in Treatment: 28 Clinic Level of Care Assessment Items TOOL 1 Quantity Score []  - Use when EandM and Procedure is performed on INITIAL visit 0 ASSESSMENTS - Nursing Assessment / Reassessment []  - General Physical Exam (combine w/  comprehensive assessment (listed just below) when performed on new 0 pt. evals) []  - 0 Comprehensive Assessment (HX, ROS, Risk Assessments, Wounds Hx, etc.) ASSESSMENTS - Wound and Skin Assessment / Reassessment []  - Dermatologic / Skin Assessment (not related to wound area) 0 ASSESSMENTS - Ostomy and/or Continence Assessment and Care []  - Incontinence Assessment and Management 0 []  - 0 Ostomy Care Assessment and Management (repouching, etc.) PROCESS - Coordination of Care []  - Simple Patient / Family Education for ongoing care 0 []  - 0 Complex (extensive) Patient / Family Education for ongoing care []  - 0 Staff obtains Programmer, systems, Records, Test Results / Process Orders []  - 0 Staff telephones HHA, Nursing Homes / Clarify orders / etc []  - 0 Routine Transfer to another Facility (non-emergent condition) []  - 0 Routine Hospital Admission (non-emergent condition) []  - 0 New Admissions / Biomedical engineer / Ordering NPWT, Apligraf, etc. []  - 0 Emergency Hospital Admission (emergent condition) PROCESS - Special Needs []  - Pediatric / Minor Patient Management 0 []  - 0 Isolation Patient Management []  - 0 Hearing / Language / Visual special needs []  - 0 Assessment of Community assistance (transportation, D/C planning, etc.) []  - 0 Additional assistance / Altered mentation []  - 0 Support Surface(s) Assessment (bed, cushion, seat, etc.) INTERVENTIONS - Miscellaneous []  - External ear exam 0 []  - 0 Patient Transfer (multiple staff / Civil Service fast streamer / Similar devices) []  - 0 Simple Staple / Suture removal (25 or less) []  - 0 Complex Staple / Suture removal (26 or more) []  - 0 Hypo/Hyperglycemic Management (do not check if billed separately) []  - 0 Ankle / Brachial Index (ABI) - do not check if billed separately Has the patient been seen at the hospital within the last three years: Yes Total Score: 0 Level Of Care: ____ Laura Santiago ([id number]) Electronic  Signature(s) Signed: 01/18/2022 4:45:40 PM By: Levora Dredge Entered By: Levora Dredge on 01/18/2022 15:03:59 Laura Santiago ([id number]) -------------------------------------------------------------------------------- Encounter Discharge  Information Details Patient Name: Laura Santiago. Date of Service: 01/18/2022 9:00 AM Medical Record Number: [id number] Patient Account Number: [id number] Date of Birth/Sex: October 13, 1946 (76 y.o. F) Treating RN: Levora Dredge Primary Care Asencion Loveday: Orvis Brill, DOCTORS Other Clinician: Referring Iceis Knab: Orvis Brill, DOCTORS Treating Isyss Espinal/Extender: Yaakov Guthrie in Treatment: 28 Encounter Discharge Information Items Post Procedure Vitals Discharge Condition: Stable Temperature (F): 98 Ambulatory Status: Walker Pulse (bpm): 90 Discharge Destination: Other (Note Required) Respiratory Rate (breaths/min): 18 Orders Sent: Yes Blood Pressure (mmHg): 105/72 Transportation: Other Accompanied By: self Schedule Follow-up Appointment: Yes Clinical Summary of Care: Electronic Signature(s) Signed: 01/18/2022 3:06:44 PM By: Levora Dredge Entered By: Levora Dredge on 01/18/2022 15:06:44 Laura Santiago ([id number]) -------------------------------------------------------------------------------- Lower Extremity Assessment Details Patient Name: Laura Santiago. Date of Service: 01/18/2022 9:00 AM Medical Record Number: [id number] Patient Account Number: [id number] Date of Birth/Sex: 1946/06/13 (76 y.o. F) Treating RN: Levora Dredge Primary Care Nazir Hacker: Orvis Brill, DOCTORS Other Clinician: Referring Stassi Fadely: HOUSECALLS, DOCTORS Treating Karmine Kauer/Extender: Yaakov Guthrie in Treatment: 28 Edema Assessment Assessed: [Left: No] [Right: No] Edema: [Left: Ye] [Right: s] Calf Left: Right: Point of Measurement: 29 cm From Medial Instep 38.5 cm Ankle Left: Right: Point of Measurement: 10 cm From Medial Instep 22.5 cm Vascular  Assessment Pulses: Dorsalis Pedis Palpable: [Left:Yes] Electronic Signature(s) Signed: 01/18/2022 4:45:40 PM By: Levora Dredge Entered By: Levora Dredge on 01/18/2022 09:22:09 Laura Santiago ([id number]) -------------------------------------------------------------------------------- Multi Wound Chart Details Patient Name: Laura Santiago. Date of Service: 01/18/2022 9:00 AM Medical Record Number: [id number] Patient Account Number: [id number] Date of Birth/Sex: [birth year] (76 y.o. 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 Comorbid History: Hypertension, Osteoarthritis, Hypertension, Osteoarthritis, N/A Received Chemotherapy, Received Received Chemotherapy, Received Radiation Radiation Date Acquired: 04/06/2021 08/24/2021 N/A Weeks of Treatment: 28 21 N/A Wound Status: Open Open N/A Wound Recurrence: No No N/A Clustered Wound: Yes No N/A Measurements L x W x D (cm) 9x8x0.1 0.1x0.1x0.1 N/A Area (cm) : 56.549 0.008 N/A Volume (cm) : 5.655 0.001 N/A % Reduction in Area: 37.40% 94.90% N/A % Reduction in Volume: 68.70% 98.40% N/A Classification: Full Thickness Without Exposed Full Thickness Without Exposed N/A Support Structures Support Structures Exudate Amount: Large None Present N/A Exudate Type: Serosanguineous N/A N/A Exudate Color: red, brown N/A N/A Wound Margin: Flat and Intact Indistinct, nonvisible N/A Granulation Amount: Small (1-33%)  None Present (0%) N/A Granulation Quality: Red, Pink N/A N/A Necrotic Amount: Medium (34-66%) Large (67-100%) N/A Necrotic Tissue: Adherent Slough Eschar N/A Exposed Structures: Fat Layer (Subcutaneous Tissue): Fascia: No N/A Yes Fat Layer (Subcutaneous Tissue): Fascia: No No Tendon: No Tendon: No Muscle: No Muscle: No Joint: No Joint: No Bone: No Bone: No Epithelialization: Small (1-33%) None N/A Treatment Notes Electronic Signature(s) Signed: 01/18/2022 4:45:40 PM By: [name], Ralene Muskrat ([id number]) Entered By: Levora Dredge on 01/18/2022 10:03:32 DORANN, DAVIDSON ([id number]) -------------------------------------------------------------------------------- Multi-Disciplinary Care Plan Details Patient Name: SHEYLI, HORWITZ. Date of Service: 01/18/2022 9:00 AM Medical Record Number: [id number] Patient Account Number: [id number] Date of Birth/Sex: 13-Nov-1946 (76 y.o. F) Treating RN: Levora Dredge Primary Care Jorge Amparo: Priscille Kluver Other Clinician: Referring Dalonte Hardage: Orvis Brill, DOCTORS Treating Keena Dinse/Extender: Yaakov Guthrie  in Treatment: 28 Active Inactive Wound/Skin Impairment Nursing Diagnoses: Impaired tissue integrity Goals: Patient/caregiver will verbalize understanding of skin care regimen Date Initiated: 07/06/2021 Date Inactivated: 07/27/2021 Target Resolution Date: 07/06/2021 Goal Status: Met Ulcer/skin breakdown will have a volume reduction of 30% by week 4 Date Initiated: 07/06/2021 Date Inactivated: 10/12/2021 Target Resolution Date: 08/06/2021 Goal Status: Unmet Unmet Reason: cont tx Ulcer/skin breakdown will have a volume reduction of 50% by week 8 Date Initiated: 07/06/2021 Target Resolution Date: 09/06/2021 Goal Status: Active Ulcer/skin breakdown will have a volume reduction of 80% by week 12 Date Initiated: 07/06/2021 Target Resolution Date: 10/06/2021 Goal Status: Active Ulcer/skin breakdown will heal within 14  weeks Date Initiated: 07/06/2021 Target Resolution Date: 11/06/2021 Goal Status: Active Interventions: Assess patient/caregiver ability to obtain necessary supplies Assess patient/caregiver ability to perform ulcer/skin care regimen upon admission and as needed Assess ulceration(s) every visit Treatment Activities: Referred to DME Brodrick Curran for dressing supplies : 07/06/2021 Skin care regimen initiated : 07/06/2021 Notes: Electronic Signature(s) Signed: 01/18/2022 4:45:40 PM By: Levora Dredge Entered By: Levora Dredge on 01/18/2022 10:03:24 Laura Santiago ([id number]) -------------------------------------------------------------------------------- Pain Assessment Details Patient Name: Laura Santiago. Date of Service: 01/18/2022 9:00 AM Medical Record Number: [id number] Patient Account Number: [id number] Date of Birth/Sex: 1946/08/23 (76 y.o. F) Treating RN: Levora Dredge Primary Care Phuong Hillary: Orvis Brill, DOCTORS Other Clinician: Referring Harvin Konicek: Orvis Brill, DOCTORS Treating Abdoulaye Drum/Extender: Yaakov Guthrie in Treatment: 28 Active Problems Location of Pain Severity and Description of Pain Patient Has Paino No Site Locations Rate the pain. Current Pain Level: 0 Pain Management and Medication Current Pain Management: Electronic Signature(s) Signed: 01/18/2022 4:45:40 PM By: Levora Dredge Entered By: Levora Dredge on 01/18/2022 09:14:50 Laura Santiago ([id number]) -------------------------------------------------------------------------------- Patient/Caregiver Education Details Patient Name: Laura Santiago. Date of Service: 01/18/2022 9:00 AM Medical Record Number: [id number] Patient Account Number: [id number] Date of Birth/Gender: [birth year] (76 y.o. F) Treating RN: Levora Dredge Primary Care Physician: Orvis Brill, DOCTORS Other Clinician: Referring Physician: Orvis Brill, DOCTORS Treating Physician/Extender: Yaakov Guthrie in Treatment:  66 Education Assessment Education Provided To: Patient Education Topics Provided Wound/Skin Impairment: Handouts: Caring for Your Ulcer Methods: Explain/Verbal Responses: State content correctly Electronic Signature(s) Signed: 01/18/2022 4:45:40 PM By: Levora Dredge Entered By: Levora Dredge on 01/18/2022 15:04:38 Laura Santiago ([id number]) -------------------------------------------------------------------------------- Wound Assessment Details Patient Name: Laura Santiago. Date of Service: 01/18/2022 9:00 AM Medical Record Number: [id number] Patient Account Number: [id number] Date of Birth/Sex: 1946/05/12 (76 y.o. F) Treating RN: Levora Dredge Primary Care Chyane Greer: Orvis Brill, DOCTORS Other Clinician: Referring Jetaun Colbath: HOUSECALLS, DOCTORS Treating Fanny Agan/Extender: Yaakov Guthrie in Treatment: 28 Wound Status Wound Number: 1 Primary Venous Leg Ulcer Etiology: Wound Location: Left, Medial Lower Leg Wound Status: Open Wounding Event: Gradually Appeared Comorbid Hypertension, Osteoarthritis, Received Chemotherapy, Date Acquired: 04/06/2021 History: Received Radiation Weeks Of Treatment: 28 Clustered Wound: Yes Photos Wound Measurements Length: (cm) 9 Width: (cm) 8 Depth: (cm) 0.1 Area: (cm) 56.549 Volume: (cm) 5.655 % Reduction in Area: 37.4% % Reduction in Volume: 68.7% Epithelialization: Small (1-33%) Tunneling: No Undermining: No Wound Description Classification: Full Thickness Without Exposed Support Structu Wound Margin: Flat and Intact Exudate Amount: Large Exudate Type: Serosanguineous Exudate Color: red, brown res Foul Odor After Cleansing: No Slough/Fibrino Yes Wound Bed Granulation Amount: Small (1-33%) Exposed Structure Granulation Quality: Red, Pink Fascia Exposed: No Necrotic Amount: Medium (34-66%) Fat Layer (Subcutaneous Tissue) Exposed: Yes Necrotic Quality: Adherent Slough Tendon Exposed: No Muscle Exposed: No Joint  Exposed: No Bone Exposed: No Treatment Notes Wound #1 (Lower Leg) Wound Laterality: Left, Medial  Cleanser Soap and Water Discharge Instruction: Gently cleanse wound with antibacterial soap, rinse and pat dry prior to dressing wounds Peri-Wound Care KALAYSIA, DEMONBREUN ([id number]) Topical Gentamicin Discharge Instruction: Apply as directed by Shiva Karis. Apply to wound bed with ketoconazole Triamcinolone Acetonide Cream, 0.1%, 15 (g) tube Discharge Instruction: Apply as directed by Maybelline Kolarik. Apply to leg Ketoconazole Cream 2%, 30 (g), tube Discharge Instruction: apply 1:1 ratio with gentamicin, apply to wound bed. Nystatin in office Primary Dressing Gauze Discharge Instruction: As directed: dry Secondary Dressing Zetuvit Plus Silicone Border Dressing 4x4 (in/in) Secured With Coban Cohesive Bandage 4x5 (yds) Stretched Discharge Instruction: Apply Coban as directed. Kerlix Roll Sterile or Non-Sterile 6-ply 4.5x4 (yd/yd) Discharge Instruction: Apply Kerlix as directed Compression Wrap Compression Stockings Add-Ons Electronic Signature(s) Signed: 01/18/2022 4:45:40 PM By: Levora Dredge Entered By: Levora Dredge on 01/18/2022 09:25:00 Laura Santiago ([id number]) -------------------------------------------------------------------------------- Wound Assessment Details Patient Name: Laura Santiago. Date of Service: 01/18/2022 9:00 AM Medical Record Number: [id number] Patient Account Number: [id number] Date of Birth/Sex: Jun 25, 1946 (76 y.o. F) Treating RN: Levora Dredge Primary Care Kerry Chisolm: Orvis Brill, DOCTORS Other Clinician: Referring Yanisa Goodgame: HOUSECALLS, DOCTORS Treating Payslee Bateson/Extender: Yaakov Guthrie in Treatment: 28 Wound Status Wound Number: 2 Primary Neuropathic Ulcer-Non Diabetic Etiology: Wound Location: Right Toe Great Wound Status: Healed - Epithelialized Wounding Event: Gradually Appeared Comorbid Hypertension, Osteoarthritis, Received  Chemotherapy, Date Acquired: 08/24/2021 History: Received Radiation Weeks Of Treatment: 21 Clustered Wound: No Photos Wound Measurements Length: (cm) 0 Width: (cm) 0 Depth: (cm) 0 Area: (cm) 0 Volume: (cm) 0 % Reduction in Area: 100% % Reduction in Volume: 100% Epithelialization: None Tunneling: No Undermining: No Wound Description Classification: Full Thickness Without Exposed Support Structure Wound Margin: Indistinct, nonvisible Exudate Amount: None Present s Foul Odor After Cleansing: No Slough/Fibrino No Wound Bed Granulation Amount: None Present (0%) Exposed Structure Necrotic Amount: Large (67-100%) Fascia Exposed: No Necrotic Quality: Eschar Fat Layer (Subcutaneous Tissue) Exposed: No Tendon Exposed: No Muscle Exposed: No Joint Exposed: No Bone Exposed: No Treatment Notes Wound #2 (Toe Great) Wound Laterality: Right Cleanser Peri-Wound Care Topical Primary Dressing JOVANKA, [place] ([id number]) Secondary Dressing Secured With Compression Wrap Compression Stockings Add-Ons Electronic Signature(s) Signed: 01/18/2022 4:45:40 PM By: Levora Dredge Entered By: Levora Dredge on 01/18/2022 10:05:43 Laura Santiago ([id number]) -------------------------------------------------------------------------------- Vitals Details Patient Name: Laura Santiago. Date of Service: 01/18/2022 9:00 AM Medical Record Number: [id number] Patient Account Number: [id number] Date of Birth/Sex: 30-Mar-1946 (76 y.o. F) Treating RN: Levora Dredge Primary Care Bonny Egger: Orvis Brill, DOCTORS Other Clinician: Referring Mindee Robledo: HOUSECALLS, DOCTORS Treating Trenita Hulme/Extender: Yaakov Guthrie in Treatment: 28 Vital Signs Time Taken: 09:12 Temperature (F): 98 Height (in): 66 Pulse (bpm): 90 Weight (lbs): 153 Respiratory Rate (breaths/min): 18 Body Mass Index (BMI): 24.7 Blood Pressure (mmHg): 105/72 Reference Range: 80 - 120 mg / dl Electronic  Signature(s) Signed: 01/18/2022 4:45:40 PM By: Levora Dredge Entered By: Levora Dredge on 01/18/2022 09:14:40

## 2022-01-20 ENCOUNTER — Encounter: Payer: Self-pay | Admitting: Oncology

## 2022-01-20 ENCOUNTER — Other Ambulatory Visit: Payer: Self-pay

## 2022-01-20 ENCOUNTER — Other Ambulatory Visit: Payer: Self-pay | Admitting: *Deleted

## 2022-01-20 ENCOUNTER — Inpatient Hospital Stay: Payer: Medicare Other | Attending: Oncology

## 2022-01-20 ENCOUNTER — Inpatient Hospital Stay: Payer: Medicare Other

## 2022-01-20 ENCOUNTER — Inpatient Hospital Stay (HOSPITAL_BASED_OUTPATIENT_CLINIC_OR_DEPARTMENT_OTHER): Payer: Medicare Other | Admitting: Oncology

## 2022-01-20 VITALS — BP 137/78 | HR 76 | Temp 97.3°F | Resp 16 | Ht 62.0 in | Wt 152.5 lb

## 2022-01-20 DIAGNOSIS — Z5112 Encounter for antineoplastic immunotherapy: Secondary | ICD-10-CM | POA: Insufficient documentation

## 2022-01-20 DIAGNOSIS — C787 Secondary malignant neoplasm of liver and intrahepatic bile duct: Secondary | ICD-10-CM | POA: Insufficient documentation

## 2022-01-20 DIAGNOSIS — C7951 Secondary malignant neoplasm of bone: Secondary | ICD-10-CM

## 2022-01-20 DIAGNOSIS — Z5181 Encounter for therapeutic drug level monitoring: Secondary | ICD-10-CM

## 2022-01-20 DIAGNOSIS — C50911 Malignant neoplasm of unspecified site of right female breast: Secondary | ICD-10-CM | POA: Diagnosis not present

## 2022-01-20 DIAGNOSIS — C50919 Malignant neoplasm of unspecified site of unspecified female breast: Secondary | ICD-10-CM

## 2022-01-20 DIAGNOSIS — Z170 Estrogen receptor positive status [ER+]: Secondary | ICD-10-CM | POA: Diagnosis not present

## 2022-01-20 DIAGNOSIS — Z5111 Encounter for antineoplastic chemotherapy: Secondary | ICD-10-CM

## 2022-01-20 DIAGNOSIS — Z791 Long term (current) use of non-steroidal anti-inflammatories (NSAID): Secondary | ICD-10-CM

## 2022-01-20 DIAGNOSIS — Z79899 Other long term (current) drug therapy: Secondary | ICD-10-CM

## 2022-01-20 LAB — COMPREHENSIVE METABOLIC PANEL
ALT: 10 U/L (ref 0–44)
AST: 21 U/L (ref 15–41)
Albumin: 3.2 g/dL — ABNORMAL LOW (ref 3.5–5.0)
Alkaline Phosphatase: 54 U/L (ref 38–126)
Anion gap: 8 (ref 5–15)
BUN: 23 mg/dL (ref 8–23)
CO2: 25 mmol/L (ref 22–32)
Calcium: 8.8 mg/dL — ABNORMAL LOW (ref 8.9–10.3)
Chloride: 106 mmol/L (ref 98–111)
Creatinine, Ser: 1.25 mg/dL — ABNORMAL HIGH (ref 0.44–1.00)
GFR, Estimated: 44 mL/min — ABNORMAL LOW (ref >60)
Glucose, Bld: 120 mg/dL — ABNORMAL HIGH (ref 70–99)
Potassium: 3.9 mmol/L (ref 3.5–5.1)
Sodium: 139 mmol/L (ref 135–145)
Total Bilirubin: 0.1 mg/dL — ABNORMAL LOW (ref 0.3–1.2)
Total Protein: 6.7 g/dL (ref 6.5–8.1)

## 2022-01-20 LAB — CBC WITH DIFFERENTIAL/PLATELET
Abs Immature Granulocytes: 0.01 10*3/uL (ref 0.00–0.07)
Basophils Absolute: 0 10*3/uL (ref 0.0–0.1)
Basophils Relative: 1 %
Eosinophils Absolute: 0.2 10*3/uL (ref 0.0–0.5)
Eosinophils Relative: 3 %
HCT: 28.5 % — ABNORMAL LOW (ref 36.0–46.0)
Hemoglobin: 8.8 g/dL — ABNORMAL LOW (ref 12.0–15.0)
Immature Granulocytes: 0 %
Lymphocytes Relative: 16 %
Lymphs Abs: 0.8 10*3/uL (ref 0.7–4.0)
MCH: 28.4 pg (ref 26.0–34.0)
MCHC: 30.9 g/dL (ref 30.0–36.0)
MCV: 91.9 fL (ref 80.0–100.0)
Monocytes Absolute: 0.4 10*3/uL (ref 0.1–1.0)
Monocytes Relative: 7 %
Neutro Abs: 3.8 10*3/uL (ref 1.7–7.7)
Neutrophils Relative %: 73 %
Platelets: 206 10*3/uL (ref 150–400)
RBC: 3.1 MIL/uL — ABNORMAL LOW (ref 3.87–5.11)
RDW: 17.2 % — ABNORMAL HIGH (ref 11.5–15.5)
WBC: 5.1 10*3/uL (ref 4.0–10.5)
nRBC: 0 % (ref 0.0–0.2)

## 2022-01-20 MED ORDER — HEPARIN SOD (PORK) LOCK FLUSH 100 UNIT/ML IV SOLN
INTRAVENOUS | Status: AC
  Administered 2022-01-20: 500 [IU]
  Filled 2022-01-20: qty 5

## 2022-01-20 MED ORDER — FAMOTIDINE IN NACL 20-0.9 MG/50ML-% IV SOLN
20.0000 mg | Freq: Once | INTRAVENOUS | Status: AC
  Administered 2022-01-20: 20 mg via INTRAVENOUS
  Filled 2022-01-20: qty 50

## 2022-01-20 MED ORDER — SODIUM CHLORIDE 0.9% FLUSH
10.0000 mL | INTRAVENOUS | Status: DC | PRN
  Filled 2022-01-20: qty 10

## 2022-01-20 MED ORDER — SODIUM CHLORIDE 0.9 % IV SOLN
Freq: Once | INTRAVENOUS | Status: AC
  Filled 2022-01-20: qty 250

## 2022-01-20 MED ORDER — DIPHENHYDRAMINE HCL 50 MG/ML IJ SOLN
50.0000 mg | Freq: Once | INTRAMUSCULAR | Status: AC
  Administered 2022-01-20: 50 mg via INTRAVENOUS
  Filled 2022-01-20: qty 1

## 2022-01-20 MED ORDER — HEPARIN SOD (PORK) LOCK FLUSH 100 UNIT/ML IV SOLN
500.0000 [IU] | Freq: Once | INTRAVENOUS | Status: AC | PRN
  Filled 2022-01-20: qty 5

## 2022-01-20 MED ORDER — SODIUM CHLORIDE 0.9 % IV SOLN
65.0000 mg/m2 | Freq: Once | INTRAVENOUS | Status: AC
  Administered 2022-01-20: 114 mg via INTRAVENOUS
  Filled 2022-01-20: qty 19

## 2022-01-20 MED ORDER — SODIUM CHLORIDE 0.9 % IV SOLN
10.0000 mg | Freq: Once | INTRAVENOUS | Status: AC
  Administered 2022-01-20: 10 mg via INTRAVENOUS
  Filled 2022-01-20: qty 10

## 2022-01-20 NOTE — Patient Instructions (Signed)
[facility]  Discharge Instructions: Thank you for choosing [facility] to provide your oncology and hematology care.  If you have a lab appointment with the Cancer Center, please go directly to the Cancer Center and check in at the registration area.  Wear comfortable clothing and clothing appropriate for easy access to any Portacath or PICC line.   We strive to give you quality time with your provider. You may need to reschedule your appointment if you arrive late (15 or more minutes).  Arriving late affects you and other patients whose appointments are after yours.  Also, if you miss three or more appointments without notifying the office, you may be dismissed from the clinic at the provider's discretion.      For prescription refill requests, have your pharmacy contact our office and allow 72 hours for refills to be completed.    Today you received the following chemotherapy and/or immunotherapy agents Taxol      To help prevent nausea and vomiting after your treatment, we encourage you to take your nausea medication as directed.  BELOW ARE SYMPTOMS THAT SHOULD BE REPORTED IMMEDIATELY: *FEVER GREATER THAN 100.4 F (38 C) OR HIGHER *CHILLS OR SWEATING *NAUSEA AND VOMITING THAT IS NOT CONTROLLED WITH YOUR NAUSEA MEDICATION *UNUSUAL SHORTNESS OF BREATH *UNUSUAL BRUISING OR BLEEDING *URINARY PROBLEMS (pain or burning when urinating, or frequent urination) *BOWEL PROBLEMS (unusual diarrhea, constipation, pain near the anus) TENDERNESS IN MOUTH AND THROAT WITH OR WITHOUT PRESENCE OF ULCERS (sore throat, sores in mouth, or a toothache) UNUSUAL RASH, SWELLING OR PAIN  UNUSUAL VAGINAL DISCHARGE OR ITCHING   Items with * indicate a potential emergency and should be followed up as soon as possible or go to the Emergency Department if any problems should occur.  Please show the CHEMOTHERAPY ALERT CARD or IMMUNOTHERAPY ALERT CARD at check-in to the  Emergency Department and triage nurse.  Should you have questions after your visit or need to cancel or reschedule your appointment, please contact [facility]  [phone] and follow the prompts.  Office hours are 8:00 a.m. to 4:30 p.m. Monday - Friday. Please note that voicemails left after 4:00 p.m. may not be returned until the following business day.  We are closed weekends and major holidays. You have access to a nurse at all times for urgent questions. Please call the main number to the clinic [phone] and follow the prompts.  For any non-urgent questions, you may also contact your provider using MyChart. We now offer e-Visits for anyone 18 and older to request care online for non-urgent symptoms. For details visit [email or web address].   Also download the MyChart app! Go to the app store, search "MyChart", open the app, select [facility], and log in with your MyChart username and password.  Due to Covid, a mask is required upon entering the hospital/clinic. If you do not have a mask, one will be given to you upon arrival. For doctor visits, patients may have 1 support person aged 18 or older with them. For treatment visits, patients cannot have anyone with them due to current Covid guidelines and our immunocompromised population.  

## 2022-01-20 NOTE — Progress Notes (Signed)
07/27/21 dye study reports states: "Patent left IJ power port catheter. Tip is short in the proximal SVC. Negative for fibrin sheath."   No blood return noted, port flushes without difficulty, no pain, swelling or redness noted. Per Dr. Janese Banks okay to proceed with Treatment at this time. Per Dr. Janese Banks, MD will also place a note regarding port use for treatments.

## 2022-01-21 LAB — CANCER ANTIGEN 27.29: CA 27.29: <3.5 U/mL (ref 0.0–38.6)

## 2022-01-22 ENCOUNTER — Encounter: Payer: Self-pay | Admitting: Oncology

## 2022-01-22 NOTE — Progress Notes (Signed)
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  She had a screening mammogram in September 2014 which showed 2.1 x 2.3 x 1.8 cm irregular mass in her right breast.  It was ER 10% positive PR negative and HER2 positive +3.  She received neoadjuvant chemotherapy with Taxol Herceptin and Perjeta for 4 cycles followed by dose dense AC/Herceptin x4 which she completed in March 2015.  She had a right lumpectomy on 04/03/2014 which showed scant residual invasive ductal carcinoma [id number].  She completed 1 year of adjuvant Herceptin chemotherapy and also completed adjuvant radiation treatment.  She was recommended anastrozole which she took on and off starting November 2015 and stopped sometime in 2020.   She was then hospitalized with neck pain and was found to have lytic lesions involving C5-C6 with pathological vertebral fractures.  She underwent radiation treatment to this area.  Image guided biopsy of the L1 vertebral body showed metastatic carcinoma consistent with breast origin ER 10% PR 0% and HER2 amplified ratio 5.1 average HER2 signal number per cell 15.0 average CEP 17 signals number per cell 3.0.  Baseline echocardiogram on 05/02/2021 showed a normal EF of 62% she was  recommended Taxol Herceptin and Perjeta which she received for 2 cycles at [facility] until June 10, 2021   She has chronic pain from her bone metastases for which she is currently on oxycodone 10 mg every 4 hours as needed and 12 mcg fentanyl patch.  She was seeing pain clinic when she was living in [name].   Patient has also been on Zometa when she was in [name] but she does have some ongoing dental issues.  She has received Xgeva in the past as well.  Her last Delton See was in July 2021.  Last PET scan was on 03/21/2021 which showed diffuse osseous metastatic disease involving the head neck chest abdomen and pelvis and spine.  Left lung apex hypermetabolic nodule and multiple hypermetabolic liver lesions concerning for disease involvement.  Enlarged hypermetabolic left inguinal lymph nodes along with hypermetabolic external iliac and left supraclavicular lymph nodes   Patient is now moved to [place] to be close to her daughter.  She lives in an independent living. Currently patient is getting Taxol Herceptin and Perjeta every 3 weeks with weekly Taxol    Interval history-continues to report sensation of skin crawling over her face which she states sometimes feels like it is getting over her eyes making it difficult for her to see.  Has chronic left lower extremity wound which is managed by wound care.  She has waxing and waning right lower extremity swelling and intermittent cellulitis.  Reports difficulty with handgrip at times.  She has not had any recent falls.  Patient recently completed a course of ciprofloxacin for right lower extremity cellulitis  ECOG PS- 2 Pain scale- 3  Review of systems- Review of Systems  Constitutional:  Positive for malaise/fatigue. Negative for chills, fever and weight loss.  HENT:  Negative for congestion, ear discharge and nosebleeds.   Eyes:  Negative for blurred vision.  Respiratory:  Negative for cough, hemoptysis, sputum production, shortness of breath and  wheezing.   Cardiovascular:  Negative for chest pain, palpitations, orthopnea and claudication.  Gastrointestinal:  Negative for abdominal pain, blood in stool, constipation, diarrhea, heartburn, melena, nausea and vomiting.  Genitourinary:  Negative for dysuria, flank pain, frequency, hematuria and urgency.  Musculoskeletal:  Negative for back pain, joint pain and myalgias.  Skin:  Negative for rash.  Neurological:  Positive for sensory change (peripheral neuropathy). Negative for dizziness, tingling, focal weakness, seizures, weakness and headaches.  Endo/Heme/Allergies:  Does not bruise/bleed easily.  Psychiatric/Behavioral:  Negative for depression and suicidal ideas. The patient does not have insomnia.      Allergies  Allergen Reactions   Corticosteroids Other (See Comments)    Pt trf from [name] and per primary md for her cancer tx. Notes that it causes agitation intolerance   Sulfa Antibiotics Other (See Comments)    Pt moved from [name] and in MD notes she has allergy but we do not know reactions when taking the drug   Celebrex [Celecoxib] Rash     Past Medical History:  Diagnosis Date   ADHD (attention deficit hyperactivity disorder)    in [place], no date on md note   Anemia    IDA 11/26/2019, Anemia in stage 4 chronic kidney disease ([place]) 09/03/2020   Arthritis    osteoarthritis right knee 09/30/2014   Breast cancer ([place]) 10/05/2013   in [place] +, [place] -, Her 2 is 3+   Cancer related pain 03/28/2021   in [name], md notes spine mets   DVT of lower extremity, bilateral ([place]) 03/30/2014   in [name]   Generalized muscle weakness 03/31/2016   in [name]   GERD (gastroesophageal reflux disease) 05/15/2013   per md in [name]   Hyperparathyroidism, secondary ([place]) 04/25/2018   in [name]   Hypertension 02/20/2016   info from MD in [name]   Lumbar compression fracture ([place]) 03/18/2021   in [place] loss 02/05/2015   in [name]   Metabolic acidosis 75/64/3329   in [place]   Metabolic  syndrome 51/88/4166   in [name]   Osteopenia after menopause 03/29/2016   in [place] of both eyes 09/30/2012   per md in [name] where pt. 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   in [name]   Stasis ulcer of left lower extremity ([place]) 03/29/2016   in [name]     Past Surgical History:  Procedure Laterality Date   CESAREAN SECTION     unknown   fibroid removed  N/A    in [place] - unknown date   IR FLUORO GUIDE CV LINE LEFT  07/27/2021   IR PORT REPAIR CENTRAL VENOUS ACCESS DEVICE Left    In [place] N/A 02/25/2014   in [name]   ovary removed      unknown   [place] CATARACT EXTRACAP,INSERT LENS Bilateral  Bilateral 09/05/2012   in [place]     unknown    LUMPECTOMY Right 04/03/2014   in [place]    Social History   Socioeconomic History   Marital status: Divorced    Spouse name: Not on file   Number of children: Not on file  Years of education: Not on file   Highest education level: Not on file  Occupational History   Occupation: retired Teacher, music    Comment: In [place]  Tobacco Use   Smoking status: Never   Smokeless tobacco: Never  Vaping Use   Vaping Use: Never used  Substance and Sexual Activity   Alcohol use: Not Currently   Drug use: Never   Sexual activity: Not Currently  Other Topics Concern   Not on file  Social History Narrative   Not on file   Social Determinants of Health   Financial Resource Strain: Not on file  Food Insecurity: Not on file  Transportation Needs: Not on file  Physical Activity: Not on file  Stress: Not on file  Social Connections: Not on file  Intimate Partner Violence: Not on file    Family History  Problem Relation Age of Onset   Pancreatic cancer Mother    Stroke Father    Diabetes Father    Hypertension Father    Heart disease Father    Skin cancer Father    Varicose Veins Father    Skin cancer Brother    Cancer - Prostate Brother      Current  Outpatient Medications:    amphetamine-dextroamphetamine (ADDERALL XR) 30 MG 24 hr capsule, Take 1 capsule (30 mg total) by mouth daily., Disp: 15 capsule, Rfl: 0   Calcium 200 MG TABS, Take 1 tablet by mouth daily., Disp: , Rfl:    fentaNYL (DURAGESIC) 12 MCG/HR, Place 1 patch onto the skin every 3 (three) days. 1 patch to skin. Next dose on 12/10/2021 at 10:40 pm, Disp: 3 patch, Rfl: 0   lisinopril (ZESTRIL) 20 MG tablet, Take 20 mg by mouth daily., Disp: , Rfl:    Multiple Vitamins-Minerals (MULTIVITAMIN ADULTS) TABS, Take 1 tablet by mouth daily., Disp: , Rfl:    Oxycodone HCl 10 MG TABS, Take 1 tablet (10 mg total) by mouth every 4 (four) hours as needed., Disp: 90 tablet, Rfl: 0   acetaminophen (TYLENOL) 500 MG tablet, Take 500-1,000 mg by mouth every 6 (six) hours as needed for mild pain or moderate pain. (Patient not taking: Reported on 01/20/2022), Disp: , Rfl:    sodium hypochlorite (DAKIN'S 1/4 STRENGTH) 0.125 % SOLN, Apply 1 application topically as directed. Moisten gauze with solution and wrap wound (Patient not taking: Reported on 01/20/2022), Disp: , Rfl:    terbinafine (LAMISIL AT) 1 % cream, Apply 1 application topically 2 (two) times daily. (Patient not taking: Reported on 01/20/2022), Disp: 30 g, Rfl: 5 No current facility-administered medications for this visit.  Facility-Administered Medications Ordered in Other Visits:    sodium chloride flush (NS) 0.9 % injection 10 mL, 10 mL, Intracatheter, PRN, Laura Guadeloupe, MD  Physical exam:  Vitals:   01/20/22 1100  BP: 137/78  Pulse: 76  Resp: 16  Temp: (!) 97.3 F (36.3 C)  TempSrc: Tympanic  Weight: 152 lb 8 oz (69.2 kg)  Height: $Remove'5\' 2"'[id number]$  (1.575 m)   Physical Exam Constitutional:      Comments: Ambulates with a walker  Cardiovascular:     Rate and Rhythm: Normal rate and regular rhythm.     Heart sounds: Normal heart sounds.  Pulmonary:     Effort: Pulmonary effort is normal.     Breath sounds: Normal breath sounds.   Abdominal:     General: Bowel sounds are normal.     Palpations: Abdomen is soft.  Musculoskeletal:  Comments: Left lower extremity dressing in place.  Mild erythema noted in the right lower extremity  Skin:    General: Skin is warm and dry.  Neurological:     Mental Status: She is alert and oriented to person, place, and time.     CMP Latest Ref Rng & Units 01/20/2022  Glucose 70 - 99 mg/dL 120(H)  BUN 8 - 23 mg/dL 23  Creatinine 0.44 - 1.00 mg/dL 1.25(H)  Sodium 135 - 145 mmol/L 139  Potassium 3.5 - 5.1 mmol/L 3.9  Chloride 98 - 111 mmol/L 106  CO2 22 - 32 mmol/L 25  Calcium 8.9 - 10.3 mg/dL 8.8(L)  Total Protein 6.5 - 8.1 g/dL 6.7  Total Bilirubin 0.3 - 1.2 mg/dL 0.1(L)  Alkaline Phos 38 - 126 U/L 54  AST 15 - 41 U/L 21  ALT 0 - 44 U/L 10   CBC Latest Ref Rng & Units 01/20/2022  WBC 4.0 - 10.5 K/uL 5.1  Hemoglobin 12.0 - 15.0 g/dL 8.8(L)  Hematocrit 36.0 - 46.0 % 28.5(L)  Platelets 150 - 400 K/uL 206   Assessment and plan- Patient is a 76 y.o. female with metastatic weakly ER positive HER2 positive breast cancer with liver lymph node and bone metastases.  She is here for on treatment assessment prior to cycle 10-day 15 of Taxol  Patient missed cycle 10-day 8 of Taxol due to rehab stay.  She will proceed with cycle 10-day 15 of Taxol alone today.  Next week would be her day off.  I will see her back in 2 weeks for cycle 11 of Herceptin and Perjeta alone.  I will plan to hold off on further doses of Taxol given her overall stable disease as well as worsening neuropathy.  She will be getting CT chest abdomen and pelvis with contrast and bone scan prior.  Bone metastases: Patient last received Zometa in end of January 2023.  If her kidney functions are stable she will receive her next Zometa when I see her in 2 weeks.  Neoplasm related pain: Continue as needed oxycodone   Visit Diagnosis 1. Encounter for antineoplastic chemotherapy   2. Metastatic breast cancer [facility])       Dr. Randa Evens, MD, MPH [facility] [phone] 01/22/2022 9:48 AM

## 2022-01-24 ENCOUNTER — Ambulatory Visit: Payer: Medicare Other

## 2022-01-24 ENCOUNTER — Other Ambulatory Visit: Payer: Medicare Other

## 2022-01-24 ENCOUNTER — Ambulatory Visit: Payer: Medicare Other | Admitting: Oncology

## 2022-01-25 ENCOUNTER — Other Ambulatory Visit: Payer: Self-pay

## 2022-01-25 ENCOUNTER — Encounter (HOSPITAL_BASED_OUTPATIENT_CLINIC_OR_DEPARTMENT_OTHER): Payer: Medicare Other | Admitting: Internal Medicine

## 2022-01-25 DIAGNOSIS — I87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: Secondary | ICD-10-CM | POA: Diagnosis not present

## 2022-01-25 DIAGNOSIS — L97822 Non-pressure chronic ulcer of other part of left lower leg with fat layer exposed: Secondary | ICD-10-CM

## 2022-01-25 DIAGNOSIS — C79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: Secondary | ICD-10-CM | POA: Diagnosis not present

## 2022-01-25 DIAGNOSIS — I872 Venous insufficiency (chronic) (peripheral): Secondary | ICD-10-CM | POA: Diagnosis not present

## 2022-01-25 NOTE — Progress Notes (Signed)
Laura Santiago ([id number]) Visit Report for 01/25/2022 Chief Complaint Document Details Patient Name: Laura Santiago. Date of Service: 01/25/2022 9:00 AM Medical Record Number: [id number] Patient Account Number: [id number] Date of Birth/Sex: 07-Jul-1946 (76 y.o. F) Treating RN: Levora Dredge Primary Care Provider: Orvis Brill, DOCTORS Other Clinician: Referring Provider: Orvis Brill, DOCTORS Treating Provider/Extender: Yaakov Guthrie in Treatment: 29 Information Obtained from: Patient Chief Complaint Left lower extremity wound Right toe wounds Left upper lateral thigh wounds Electronic Signature(s) Signed: 01/25/2022 10:52:39 AM By: Kalman Shan DO Entered By: Kalman Shan on 01/25/2022 10:48:18 Laura Santiago ([id number]) -------------------------------------------------------------------------------- HPI Details Patient Name: Laura Santiago. Date of Service: 01/25/2022 9:00 AM Medical Record Number: [id number] Patient Account Number: [id number] Date of Birth/Sex: 02-Jul-1946 (76 y.o. F) Treating RN: Levora Dredge Primary Care Provider: Priscille Kluver Other Clinician: Referring Provider: Orvis Brill, DOCTORS Treating Provider/Extender: Yaakov Guthrie in Treatment: 29 History of Present Illness HPI Description: Admission 7/27 Laura Santiago is a 76 year old female with a past medical history of ADHD, metastatic breast cancer, stage IV chronic kidney disease, history of DVT on Xarelto and chronic venous insufficiency that presents to the clinic for a chronic left lower extremity wound. She recently moved to [facility] 4 days ago. She was being followed by wound care center in [name]. She reports a 10-year history of wounds to her left lower extremity that eventually do heal with debridement and compression therapy. She states that the current wound reopened 4 months ago and she is using Vaseline and Coban. She denies signs of  infection. 8/3; patient presents for 1 week follow-up. She reports no issues or complaints today. She states she had vascular studies done in the last week. She denies signs of infection. She brought her little service dog with her today. 8/17; patient presents for follow-up. She has missed her last clinic appointment. She states she took the wrap off and attempted to rewrap her leg. She is having difficulty with transportation. She has her service dog with her today. Overall she feels well and reports improvement in wound healing. She denies signs of infection. She reports owning an old Velcro wrap compression and has this at her living facility 9/14; patient presents for follow-up. Patient states that over the past 2 to 3 weeks she developed toe wounds to her right foot. She attributes this to tight fitting shoes. She subsequently developed cellulitis in the right leg and has been treated by doxycycline by her oncologist. She reports improvement in symptoms however continues to have some redness and swelling to this leg. To the left lower extremity patient has been having her wraps changed with home health twice weekly. She states that the [facility] is not helping control the drainage. Other than that she has no issues or complaints today. She denies signs of infection to the left lower extremity. 9/21; patient presents for follow-up. She reports seeing infectious disease for her cellulitis. She reports no further management. She has home health that changes the wraps twice weekly. She has no issues or complaints today. She denies signs of infection. 10/5; patient presents for follow-up. She has no issues or complaints today. She denies signs of infection. She states that the right great toe has not been dressed by home health. 10/12; patient presents for follow-up. She has no issues or complaints today. She reports improvement in her wound healing. She has been using silver alginate to the  right great toe wound. She denies signs of infection. 10/26; patient presents for follow-up.  Home health did not have sorbact so they continued to use Hydrofera Blue under the wrap. She has been using silver alginate to the great toe wound however she did not have a dressing in place today. She currently denies signs of infection. 11/2; patient presents for follow-up. She has been using sorb act under the compression wrap. She reports using silver alginate to the toe wound again she does not have a dressing in place. She currently denies signs of infection. 11/23; patient presents for follow-up. Unfortunately she has missed her last 2 clinic appointments. She was last seen 3 weeks ago. She did her own compression wrap with Kerlix and Coban yesterday after seeing vein and vascular. She has not been dressing her right great toe wound. She currently denies signs of infection. 11/30; patient presents for 1 week follow-up. She states she changed her dressing last week prior to home health and use sorb act with Dakin's and Hydrofera Blue. Home health has changed the dressing as well and they have been using sorbact. Today she reports increased redness to her right lower extremity. She has a history of cellulitis to this leg. She has been using silver alginate to the right great toe. Unfortunately she had an episode of diarrhea prior to coming in and had feces all over the right leg and to the wrap of her left leg. 12/7; patient presents for 1 week follow-up. She states that home health did not come out to change the dressing and she took it off yesterday. It is unclear if she is dressing the right toe wound. She denies signs of infection. 12/14; patient presents for 1 week follow-up. She has no issues or complaints today. 12/21; patient presents for follow-up. She has no issues or complaints today. She denies signs of infection. 12/28/2021; patient presents for follow-up. She was hospitalized for sepsis  secondary to right lower extremity cellulitis On 12/23. She states she is currently at a SNF. She states that she was started on doxycycline this morning for her right great toe swelling and redness. She is not sure what dressings have been done to her left lower extremity for the past 3 weeks. She says its been mainly gauze with an Ace wrap. 1/25; patient presents for follow-up. She is still residing in a skilled nursing facility. She reports mild pain to the left lower extremity wound bed. She states she is going to see a podiatrist soon. 2/8; patient presents for follow-up. She has moved back to her residential community from her skilled nursing facility. She has no issues or complaints today. She denies signs of systemic infections. 2/15; patient presents for follow-up. He has no issues or complaints today. She denies systemic signs of infection. DILAN, FULLENWIDER ([id number]) Electronic Signature(s) Signed: 01/25/2022 10:52:39 AM By: Kalman Shan DO Entered By: Kalman Shan on 01/25/2022 10:48:46 SHALEAH, NISSLEY ([id number]) -------------------------------------------------------------------------------- Physical Exam Details Patient Name: NATALYN, SZYMANOWSKI. Date of Service: 01/25/2022 9:00 AM Medical Record Number: [id number] Patient Account Number: [id number] Date of Birth/Sex: 09/27/1946 (76 y.o. F) Treating RN: Levora Dredge Primary Care Provider: Priscille Kluver Other Clinician: Referring Provider: HOUSECALLS, DOCTORS Treating Provider/Extender: Yaakov Guthrie in Treatment: 37 Constitutional . Cardiovascular . Psychiatric . Notes Left lower extremity: Large open wound to the medial aspect with Granulation tissue and scant nonviable tissue. No surrounding signs of infection. Electronic Signature(s) Signed: 01/25/2022 10:52:39 AM By: Kalman Shan DO Entered By: Kalman Shan on 01/25/2022 10:49:34 Laura Santiago  ([id number]) -------------------------------------------------------------------------------- Physician Orders Details Patient Name:  Ra, Tanesha M. Date of Service: 01/25/2022 9:00 AM Medical Record Number: [id number] Patient Account Number: [id number] Date of Birth/Sex: [birth year] (76 y.o. F) Treating RN: Levora Dredge Primary Care Provider: Orvis Brill, DOCTORS Other Clinician: Referring Provider: Orvis Brill, DOCTORS Treating Provider/Extender: Yaakov Guthrie in Treatment: 30 Verbal / Phone Orders: No Diagnosis Coding Follow-up Appointments o Return Appointment in 2 weeks. o Nurse Visit as needed [place] for wound care. May utilize formulary equivalent dressing for wound treatment orders unless otherwise specified. Home Health Nurse may visit PRN to address patientos wound care needs. o **Please direct any NON-WOUND related issues/requests for orders to patient's Primary Care Physician. **If current dressing causes regression in wound condition, may D/C ordered dressing product/s and apply Normal Saline Moist Dressing daily until next [place] or Other MD appointment. **Notify Wound Healing Center of regression in wound condition at [phone]. o Other Home Health Orders/Instructions: - Dressing change 3 x weekly, once by wound care and once at wound clinic weekly Bathing/ Shower/ Hygiene o May shower with wound dressing protected with water repellent cover or cast protector. o No tub bath. Anesthetic (Use 'Patient Medications' Section for Anesthetic Order Entry) o Lidocaine applied to wound bed Edema Control - Lymphedema / Segmental Compressive Device / Other o Optional: One layer of unna paste to top of compression wrap (to act as an anchor). o Elevate leg(s) parallel to the floor when sitting. o DO YOUR BEST to sleep in the bed at night. DO NOT sleep in your recliner.  Long hours of sitting in a recliner leads to swelling of the legs and/or potential wounds on your backside. Additional Orders / Instructions o Follow Nutritious Diet and Increase Protein Intake Wound Treatment Wound #1 - Lower Leg Wound Laterality: Left, Medial Topical: Gentamicin 3 x Per Week/30 Days Discharge Instructions: Apply as directed by provider. Apply to wound bed with ketoconazole Topical: Triamcinolone Acetonide Cream, 0.1%, 15 (g) tube 3 x Per Week/30 Days Discharge Instructions: Apply as directed by provider. Apply to leg Topical: Ketoconazole Cream 2%, 30 (g), tube 3 x Per Week/30 Days Discharge Instructions: apply 1:1 ratio with gentamicin, apply to wound bed. Nystatin in office Secondary Dressing: Zetuvit Plus Silicone Non-bordered 5x5 (in/in) 3 x Per Week/30 Days Secured With: Coban Cohesive Bandage 4x5 (yds) Stretched 3 x Per Week/30 Days Discharge Instructions: Apply Coban as directed. Please wrap from foot to just below knee to help swelling Secured With: Kerlix Roll Sterile or Non-Sterile 6-ply 4.5x4 (yd/yd) 3 x Per Week/30 Days Discharge Instructions: Apply Kerlix as directed Please wrap from foot to just below knee, to help swelling Electronic Signature(s) Signed: 01/25/2022 10:52:39 AM By: Kalman Shan DO Entered By: Kalman Shan on 01/25/2022 10:52:08 Laura Santiago ([id number]) -------------------------------------------------------------------------------- Problem List Details Patient Name: Laura Santiago. Date of Service: 01/25/2022 9:00 AM Medical Record Number: [id number] Patient Account Number: [id number] Date of Birth/Sex: [birth year] (76 y.o. F) Treating RN: Levora Dredge Primary Care Provider: Orvis Brill, DOCTORS Other Clinician: Referring Provider: HOUSECALLS, DOCTORS Treating Provider/Extender: Yaakov Guthrie in Treatment: 29 Active Problems ICD-10 Encounter Code Description Active Date MDM Diagnosis L97.822 Non-pressure  chronic ulcer of other part of left lower leg with fat layer 11/02/2021 No Yes exposed I87.312 Chronic venous hypertension (idiopathic) with ulcer of left lower 11/02/2021 No Yes extremity S91.201D Unspecified open wound of right great toe with damage to nail, 08/31/2021 No Yes subsequent encounter I87.2 Venous insufficiency (chronic) (peripheral) 07/06/2021 No Yes Z79.01  Long term (current) use of anticoagulants 07/06/2021 No Yes I10 Essential (primary) hypertension 07/06/2021 No Yes 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 07/06/2021 No Yes Inactive Problems ICD-10 Code Description Active Date Inactive Date S81.802A Unspecified open wound, left lower leg, initial encounter 07/06/2021 07/06/2021 S91.101A Unspecified open wound of right great toe without damage to nail, initial 08/24/2021 08/24/2021 encounter S91.104A Unspecified open wound of right lesser toe(s) without damage to nail, initial 08/24/2021 08/24/2021 encounter Resolved Problems ICD-10 BEUNA, BOLDING ([id number]) Code Description Active Date Resolved Date S91.104D Unspecified open wound of right lesser toe(s) without damage to nail, 08/31/2021 08/31/2021 subsequent encounter Electronic Signature(s) Signed: 01/25/2022 10:52:39 AM By: Kalman Shan DO Entered By: Kalman Shan on 01/25/2022 10:48:02 Laura Santiago ([id number]) -------------------------------------------------------------------------------- Progress Note Details Patient Name: Laura Santiago. Date of Service: 01/25/2022 9:00 AM Medical Record Number: [id number] Patient Account Number: [id number] Date of Birth/Sex: Jun 17, 1946 (76 y.o. F) Treating RN: Levora Dredge Primary Care Provider: Priscille Kluver Other Clinician: Referring Provider: Orvis Brill, DOCTORS Treating Provider/Extender: Yaakov Guthrie in Treatment: 29 Subjective Chief Complaint Information obtained from Patient Left lower extremity wound Right toe wounds Left upper  lateral thigh wounds History of Present Illness (HPI) Admission 7/27 Laura Santiago is a 76 year old female with a past medical history of ADHD, metastatic breast cancer, stage IV chronic kidney disease, history of DVT on Xarelto and chronic venous insufficiency that presents to the clinic for a chronic left lower extremity wound. She recently moved to [facility] 4 days ago. She was being followed by wound care center in [name]. She reports a 10-year history of wounds to her left lower extremity that eventually do heal with debridement and compression therapy. She states that the current wound reopened 4 months ago and she is using Vaseline and Coban. She denies signs of infection. 8/3; patient presents for 1 week follow-up. She reports no issues or complaints today. She states she had vascular studies done in the last week. She denies signs of infection. She brought her little service dog with her today. 8/17; patient presents for follow-up. She has missed her last clinic appointment. She states she took the wrap off and attempted to rewrap her leg. She is having difficulty with transportation. She has her service dog with her today. Overall she feels well and reports improvement in wound healing. She denies signs of infection. She reports owning an old Velcro wrap compression and has this at her living facility 9/14; patient presents for follow-up. Patient states that over the past 2 to 3 weeks she developed toe wounds to her right foot. She attributes this to tight fitting shoes. She subsequently developed cellulitis in the right leg and has been treated by doxycycline by her oncologist. She reports improvement in symptoms however continues to have some redness and swelling to this leg. To the left lower extremity patient has been having her wraps changed with home health twice weekly. She states that the [facility] is not helping control the drainage. Other than that she has  no issues or complaints today. She denies signs of infection to the left lower extremity. 9/21; patient presents for follow-up. She reports seeing infectious disease for her cellulitis. She reports no further management. She has home health that changes the wraps twice weekly. She has no issues or complaints today. She denies signs of infection. 10/5; patient presents for follow-up. She has no issues or complaints today. She denies signs of infection. She states that the right great toe has not  been dressed by home health. 10/12; patient presents for follow-up. She has no issues or complaints today. She reports improvement in her wound healing. She has been using silver alginate to the right great toe wound. She denies signs of infection. 10/26; patient presents for follow-up. Home health did not have sorbact so they continued to use Hydrofera Blue under the wrap. She has been using silver alginate to the great toe wound however she did not have a dressing in place today. She currently denies signs of infection. 11/2; patient presents for follow-up. She has been using sorb act under the compression wrap. She reports using silver alginate to the toe wound again she does not have a dressing in place. She currently denies signs of infection. 11/23; patient presents for follow-up. Unfortunately she has missed her last 2 clinic appointments. She was last seen 3 weeks ago. She did her own compression wrap with Kerlix and Coban yesterday after seeing vein and vascular. She has not been dressing her right great toe wound. She currently denies signs of infection. 11/30; patient presents for 1 week follow-up. She states she changed her dressing last week prior to home health and use sorb act with Dakin's and Hydrofera Blue. Home health has changed the dressing as well and they have been using sorbact. Today she reports increased redness to her right lower extremity. She has a history of cellulitis to this leg.  She has been using silver alginate to the right great toe. Unfortunately she had an episode of diarrhea prior to coming in and had feces all over the right leg and to the wrap of her left leg. 12/7; patient presents for 1 week follow-up. She states that home health did not come out to change the dressing and she took it off yesterday. It is unclear if she is dressing the right toe wound. She denies signs of infection. 12/14; patient presents for 1 week follow-up. She has no issues or complaints today. 12/21; patient presents for follow-up. She has no issues or complaints today. She denies signs of infection. 12/28/2021; patient presents for follow-up. She was hospitalized for sepsis secondary to right lower extremity cellulitis On 12/23. She states she is currently at a SNF. She states that she was started on doxycycline this morning for her right great toe swelling and redness. She is not sure what dressings have been done to her left lower extremity for the past 3 weeks. She says its been mainly gauze with an Ace wrap. 1/25; patient presents for follow-up. She is still residing in a skilled nursing facility. She reports mild pain to the left lower extremity wound bed. She states she is going to see a podiatrist soon. 2/8; patient presents for follow-up. She has moved back to her residential community from her skilled nursing facility. She has no issues or complaints today. She denies signs of systemic infections. ADELISA, SATTERWHITE ([id number]) 2/15; patient presents for follow-up. He has no issues or complaints today. She denies systemic signs of infection. Objective Constitutional Vitals Time Taken: 9:05 AM, Height: 66 in, Weight: 153 lbs, BMI: 24.7, Temperature: 98.9 F, Pulse: 91 bpm, Respiratory Rate: 18 breaths/min, Blood Pressure: 137/75 mmHg. General Notes: Left lower extremity: Large open wound to the medial aspect with Granulation tissue and scant nonviable tissue. No surrounding signs  of infection. Integumentary (Hair, Skin) Wound #1 status is Open. Original cause of wound was Gradually Appeared. The date acquired was: 04/06/2021. The wound has been in treatment 29 weeks. The wound is  located on the Left,Medial Lower Leg. The wound measures 9cm length x 10cm width x 0.1cm depth; 70.686cm^2 area and 7.069cm^3 volume. There is Fat Layer (Subcutaneous Tissue) exposed. There is no tunneling or undermining noted. There is a large amount of serosanguineous drainage noted. The wound margin is flat and intact. There is small (1-33%) red, pink granulation within the wound bed. There is a medium (34-66%) amount of necrotic tissue within the wound bed including Adherent Slough. Assessment Active Problems ICD-10 Non-pressure chronic ulcer of other part of left lower leg with fat layer exposed Chronic venous hypertension (idiopathic) with ulcer of left lower extremity Unspecified open wound of right great toe with damage to nail, subsequent encounter Venous insufficiency (chronic) (peripheral) Long term (current) use of anticoagulants Essential (primary) hypertension Secondary malignant neoplasm of breast Patient's wound has shown improvement in appearance since last clinic visit. I recommended continuing with gentamicin and ketoconazole under Kerlix/Coban. We will likely switch to a different wound care dressing at next clinic visit to help with further wound healing. Plan Follow-up Appointments: Return Appointment in 2 weeks. Nurse Visit as needed Home Health: [place]: - [place] for wound care. May utilize formulary equivalent dressing for wound treatment orders unless otherwise specified. Home Health Nurse may visit PRN to address patient s wound care needs. **Please direct any NON-WOUND related issues/requests for orders to patient's Primary Care Physician. **If current dressing causes regression in wound condition, may D/C ordered dressing  product/s and apply Normal Saline Moist Dressing daily until next [place] or Other MD appointment. **Notify Wound [facility]aling Center of regression in wound condition at [phone]. Other Home Health Orders/Instructions: - Dressing change 3 x weekly, once by wound care and once at wound clinic weekly Bathing/ Shower/ Hygiene: May shower with wound dressing protected with water repellent cover or cast protector. No tub bath. Anesthetic (Use 'Patient Medications' Section for Anesthetic Order Entry): Lidocaine applied to wound bed Edema Control - Lymphedema / Segmental Compressive Device / Other: Optional: One layer of unna paste to top of compression wrap (to act as an anchor). Elevate leg(s) parallel to the floor when sitting. Laura, Santiago. ([id number]) DO YOUR BEST to sleep in the bed at night. DO NOT sleep in your recliner. Long hours of sitting in a recliner leads to swelling of the legs and/or potential wounds on your backside. Additional Orders / Instructions: Follow Nutritious Diet and Increase Protein Intake WOUND #1: - Lower Leg Wound Laterality: Left, Medial Cleanser: Soap and Water (Home Health) (Generic) 3 x Per Week/30 Days Discharge Instructions: Gently cleanse wound with antibacterial soap, rinse and pat dry prior to dressing wounds Topical: Gentamicin 3 x Per Week/30 Days Discharge Instructions: Apply as directed by provider. Apply to wound bed with ketoconazole Topical: Triamcinolone Acetonide Cream, 0.1%, 15 (g) tube 3 x Per Week/30 Days Discharge Instructions: Apply as directed by provider. Apply to leg Topical: Ketoconazole Cream 2%, 30 (g), tube 3 x Per Week/30 Days Discharge Instructions: apply 1:1 ratio with gentamicin, apply to wound bed. Nystatin in office Secondary Dressing: Zetuvit Plus Silicone Non-bordered 5x5 (in/in) 3 x Per Week/30 Days Secured With: Coban Cohesive Bandage 4x5 (yds) Stretched 3 x Per Week/30 Days Discharge Instructions: Apply  Coban as directed. Please wrap from foot to just below knee to help swelling Secured With: [employer] Sterile or Non-Sterile 6-ply 4.5x4 (yd/yd) 3 x Per Week/30 Days Discharge Instructions: Apply Kerlix as directed Please wrap from foot to just below knee, to help  swelling 1. Gentamicin and ketoconazole under Kerlix/Coban 2. Follow-up in 1 week Electronic Signature(s) Signed: 01/25/2022 10:52:39 AM By: Kalman Shan DO Entered By: Kalman Shan on 01/25/2022 10:51:17 Laura Santiago ([id number]) -------------------------------------------------------------------------------- SuperBill Details Patient Name: Laura Santiago. Date of Service: 01/25/2022 Medical Record Number: [id number] Patient Account Number: [id number] Date of Birth/Sex: [birth year] (76 y.o. 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) Z79.01 Long term (current) use of anticoagulants I10 Essential (primary) hypertension 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 Facility Procedures CPT4 Code: 73668159 Description: [phone] - WOUND CARE VISIT-LEV 2 EST PT Modifier: Quantity: 1 Physician Procedures CPT4 Code: 1518343 Description: 73578 - WC PHYS LEVEL 3 - EST PT Modifier: Quantity: 1 CPT4 Code: Description: ICD-10 Diagnosis Description 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

## 2022-01-25 NOTE — Progress Notes (Signed)
AKIA, MONTALBAN ([id number]) Visit Report for 01/25/2022 Arrival Information Details Patient Name: Laura Santiago, Laura Santiago. Date of Service: 01/25/2022 9:00 AM Medical Record Number: [id number] Patient Account Number: [id number] Date of Birth/Sex: September 29, 1946 (76 y.o. F) Treating RN: Levora Dredge Primary Care Lynlee Stratton: Orvis Brill, DOCTORS Other Clinician: Referring Remie Mathison: HOUSECALLS, DOCTORS Treating Aldwin Micalizzi/Extender: Yaakov Guthrie in Treatment: 29 Visit Information History Since Last Visit Added or deleted any medications: No Patient Arrived: Walker Any new allergies or adverse reactions: No Arrival Time: 09:04 Had a fall or experienced change in No Accompanied By: self activities of daily living that may affect Transfer Assistance: None risk of falls: Patient Identification Verified: Yes Hospitalized since last visit: No Secondary Verification Process Completed: Yes Has Dressing in Place as Prescribed: Yes Patient Has Alerts: Yes Has Compression in Place as Prescribed: Yes Patient Alerts: PT HAS SERVICE ANIMAL Pain Present Now: Yes ABI 07/11/21 R) 1.16 L) 1.27 Electronic Signature(s) Signed: 01/25/2022 5:33:03 PM By: Levora Dredge Entered By: Levora Dredge on 01/25/2022 09:05:07 Laura Santiago ([id number]) -------------------------------------------------------------------------------- Clinic Level of Care Assessment Details Patient Name: Laura Santiago. Date of Service: 01/25/2022 9:00 AM Medical Record Number: [id number] Patient Account Number: [id number] Date of Birth/Sex: 10/29/1946 (76 y.o. F) Treating RN: Levora Dredge Primary Care Cieara Stierwalt: Orvis Brill, DOCTORS Other Clinician: Referring Ahnyla Mendel: HOUSECALLS, DOCTORS Treating Jamela Cumbo/Extender: Yaakov Guthrie in Treatment: 29 Clinic Level of Care Assessment Items TOOL 4 Quantity Score _0  - Use when only an EandM is performed on FOLLOW-UP visit 0 ASSESSMENTS - Nursing Assessment /  Reassessment _1  - Reassessment of Co-morbidities (includes updates in patient status) 0 _2  - 0 Reassessment of Adherence to Treatment Plan ASSESSMENTS - Wound and Skin Assessment / Reassessment X - Simple Wound Assessment / Reassessment - one wound 1 5 _3  - 0 Complex Wound Assessment / Reassessment - multiple wounds _4  - 0 Dermatologic / Skin Assessment (not related to wound area) ASSESSMENTS - Focused Assessment _5  - Circumferential Edema Measurements - multi extremities 0 _6  - 0 Nutritional Assessment / Counseling / Intervention _7  - 0 Lower Extremity Assessment (monofilament, tuning fork, pulses) _8  - 0 Peripheral Arterial Disease Assessment (using hand held doppler) ASSESSMENTS - Ostomy and/or Continence Assessment and Care _9  - Incontinence Assessment and Management 0 _10  - 0 Ostomy Care Assessment and Management (repouching, etc.) PROCESS - Coordination of Care X - Simple Patient / Family Education for ongoing care 1 15 _11  - 0 Complex (extensive) Patient / Family Education for ongoing care _12  - 0 Staff obtains Programmer, systems, Records, Test Results / Process Orders _13  - 0 Staff telephones HHA, Nursing Homes / Clarify orders / etc _14  - 0 Routine Transfer to another Facility (non-emergent condition) _15  - 0 Routine Hospital Admission (non-emergent condition) _16  - 0 New Admissions / Biomedical engineer / Ordering NPWT, Apligraf, etc. _17  - 0 Emergency Hospital Admission (emergent condition) X- 1 10 Simple Discharge Coordination _18  - 0 Complex (extensive) Discharge Coordination PROCESS - Special Needs _19  - Pediatric / Minor Patient Management 0 _20  - 0 Isolation Patient Management _21  - 0 Hearing / Language / Visual special needs _22  - 0 Assessment of Community assistance (transportation, D/C planning, etc.) _23  - 0 Additional assistance / Altered mentation _24  - 0 Support Surface(s) Assessment (bed, cushion, seat, etc.) INTERVENTIONS - Wound Cleansing /  Measurement EDWARD, TREVINO. ([id number]) X- 1 5 Simple Wound Cleansing - one wound _25  - 0 Complex Wound Cleansing - multiple wounds X- 1 5 Wound Imaging (photographs - any number of wounds) _26  - 0  Wound Tracing (instead of photographs) X- 1 5 Simple Wound Measurement - one wound _0  - 0 Complex Wound Measurement - multiple wounds INTERVENTIONS - Wound Dressings X - Small Wound Dressing one or multiple wounds 1 10 _1  - 0 Medium Wound Dressing one or multiple wounds _2  - 0 Large Wound Dressing one or multiple wounds <LFYBOFBPZWCHENID>_7<\/OEUMPNTIRWERXVQM>_0  - 0 Application of Medications - topical <QQPYPPJKDTOIZTIW>_5<\/YKDXIPJASNKNLZJQ>_7  - 0 Application of Medications - injection INTERVENTIONS - Miscellaneous _5  - External ear exam 0 _6  - 0 Specimen Collection (cultures, biopsies, blood, body fluids, etc.) _7  - 0 Specimen(s) / Culture(s) sent or taken to Lab for analysis _8  - 0 Patient Transfer (multiple staff / Civil Service fast streamer / Similar devices) _9  - 0 Simple Staple / Suture removal (25 or less) _10  - 0 Complex Staple / Suture removal (26 or more) _11  - 0 Hypo / Hyperglycemic Management (close monitor of Blood Glucose) _12  - 0 Ankle / Brachial Index (ABI) - do not check if billed separately X- 1 5 Vital Signs Has the patient been seen at the hospital within the last three years: Yes Total Score: 60 Level Of Care: New/Established - Level 2 Electronic Signature(s) Signed: 01/25/2022 5:33:03 PM By: Levora Dredge Entered By: Levora Dredge on 01/25/2022 10:13:16 Laura Santiago ([id number]) -------------------------------------------------------------------------------- Encounter Discharge Information Details Patient Name: Laura Santiago. Date of Service: 01/25/2022 9:00 AM Medical Record Number: [id number] Patient Account Number: [id number] Date of Birth/Sex: [birth year] (76 y.o. F) Treating RN: Levora Dredge Primary Care Kemet Nijjar: Orvis Brill, DOCTORS Other Clinician: Referring Marzetta Lanza: Orvis Brill, DOCTORS Treating Mehgan Santmyer/Extender:  Yaakov Guthrie in Treatment: 29 Encounter Discharge Information Items Discharge Condition: Stable Ambulatory Status: Walker Discharge Destination: Other (Note Required) Orders Sent: Yes Transportation: Other Accompanied By: self Schedule Follow-up Appointment: Yes Clinical Summary of Care: Electronic Signature(s) Signed: 01/25/2022 10:14:46 AM By: Levora Dredge Entered By: Levora Dredge on 01/25/2022 10:14:46 Laura Santiago ([id number]) -------------------------------------------------------------------------------- Lower Extremity Assessment Details Patient Name: Laura Santiago. Date of Service: 01/25/2022 9:00 AM Medical Record Number: [id number] Patient Account Number: [id number] Date of Birth/Sex: 03/15/1946 (76 y.o. F) Treating RN: Levora Dredge Primary Care Yasaman Kolek: Orvis Brill, DOCTORS Other Clinician: Referring Eleanor Gatliff: HOUSECALLS, DOCTORS Treating Harce Volden/Extender: Yaakov Guthrie in Treatment: 29 Edema Assessment Assessed: [Left: No] [Right: No] Edema: [Left: Ye] [Right: s] Calf Left: Right: Point of Measurement: 29 cm From Medial Instep 36.7 cm Ankle Left: Right: Point of Measurement: 10 cm From Medial Instep 21.4 cm Vascular Assessment Pulses: Dorsalis Pedis Palpable: [Left:Yes] Electronic Signature(s) Signed: 01/25/2022 5:33:03 PM By: Levora Dredge Entered By: Levora Dredge on 01/25/2022 09:16:05 Laura Santiago ([id number]) -------------------------------------------------------------------------------- Multi Wound Chart Details Patient Name: Laura Santiago. Date of Service: 01/25/2022 9:00 AM Medical Record Number: [id number] Patient Account Number: [id number] Date of Birth/Sex: January 06, 1946 (76 y.o. F) Treating RN: Levora Dredge Primary Care Shanee Batch: Orvis Brill, DOCTORS Other Clinician: Referring Azir Muzyka: HOUSECALLS, DOCTORS Treating Elion Hocker/Extender: Yaakov Guthrie in Treatment: 29 Vital Signs Height(in):  66 Pulse(bpm): 91 Weight(lbs): 153 Blood Pressure(mmHg): 137/75 Body Mass Index(BMI): 24.7 Temperature(F): 98.9 Respiratory Rate(breaths/min): 18 Photos: [N/A:N/A] Wound Location: Left, Medial Lower Leg N/A N/A Wounding Event: Gradually Appeared N/A N/A Primary Etiology: Venous Leg Ulcer N/A N/A Comorbid History: Hypertension, Osteoarthritis, N/A N/A Received Chemotherapy, Received Radiation Date Acquired: 04/06/2021 N/A N/A Weeks of Treatment: 29 N/A N/A Wound Status: Open N/A N/A Wound Recurrence: No N/A N/A Clustered Wound: Yes N/A N/A Measurements L x W x D (cm) 9x10x0.1 N/A N/A Area (cm) : 70.686 N/A N/A Volume (cm) : 7.069 N/A N/A %  Reduction in Area: 21.70% N/A N/A % Reduction in Volume: 60.90% N/A N/A Classification: Full Thickness Without Exposed N/A N/A Support Structures Exudate Amount: Large N/A N/A Exudate Type: Serosanguineous N/A N/A Exudate Color: red, brown N/A N/A Wound Margin: Flat and Intact N/A N/A Granulation Amount: Small (1-33%) N/A N/A Granulation Quality: Red, Pink N/A N/A Necrotic Amount: Medium (34-66%) N/A N/A Exposed Structures: Fat Layer (Subcutaneous Tissue): N/A N/A Yes Fascia: No Tendon: No Muscle: No Joint: No Bone: No Epithelialization: Small (1-33%) N/A N/A Treatment Notes Electronic Signature(s) Signed: 01/25/2022 5:33:03 PM By: Levora Dredge Entered By: Levora Dredge on 01/25/2022 09:16:50 DAKOTAH, HEIMAN ([id number]) KARIMAH, WINQUIST ([id number]) -------------------------------------------------------------------------------- Multi-Disciplinary Care Plan Details Patient Name: QUINISHA, MOULD. Date of Service: 01/25/2022 9:00 AM Medical Record Number: [id number] Patient Account Number: [id number] Date of Birth/Sex: [birth year] (76 y.o. F) Treating RN: Levora Dredge Primary Care Chanae Gemma: Orvis Brill, DOCTORS Other Clinician: Referring Bonnetta Allbee: Orvis Brill, DOCTORS Treating Elysha Daw/Extender: Yaakov Guthrie in Treatment: 29 Active Inactive Wound/Skin Impairment Nursing Diagnoses: Impaired tissue integrity Goals: Patient/caregiver will verbalize understanding of skin care regimen Date Initiated: 07/06/2021 Date Inactivated: 07/27/2021 Target Resolution Date: 07/06/2021 Goal Status: Met Ulcer/skin breakdown will have a volume reduction of 30% by week 4 Date Initiated: 07/06/2021 Date Inactivated: 10/12/2021 Target Resolution Date: 08/06/2021 Goal Status: Unmet Unmet Reason: cont tx Ulcer/skin breakdown will have a volume reduction of 50% by week 8 Date Initiated: 07/06/2021 Target Resolution Date: 09/06/2021 Goal Status: Active Ulcer/skin breakdown will have a volume reduction of 80% by week 12 Date Initiated: 07/06/2021 Target Resolution Date: 10/06/2021 Goal Status: Active Ulcer/skin breakdown will heal within 14 weeks Date Initiated: 07/06/2021 Target Resolution Date: 11/06/2021 Goal Status: Active Interventions: Assess patient/caregiver ability to obtain necessary supplies Assess patient/caregiver ability to perform ulcer/skin care regimen upon admission and as needed Assess ulceration(s) every visit Treatment Activities: Referred to DME Linc Renne for dressing supplies : 07/06/2021 Skin care regimen initiated : 07/06/2021 Notes: Electronic Signature(s) Signed: 01/25/2022 5:33:03 PM By: Levora Dredge Entered By: Levora Dredge on 01/25/2022 09:16:42 Laura Santiago ([id number]) -------------------------------------------------------------------------------- Pain Assessment Details Patient Name: Laura Santiago. Date of Service: 01/25/2022 9:00 AM Medical Record Number: [id number] Patient Account Number: [id number] Date of Birth/Sex: November 30, 1946 (76 y.o. F) Treating RN: Levora Dredge Primary Care Tahjae Durr: Orvis Brill, DOCTORS Other Clinician: Referring Nuno Brubacher: Orvis Brill, DOCTORS Treating Parnell Spieler/Extender: Yaakov Guthrie in Treatment: 29 Active  Problems Location of Pain Severity and Description of Pain Patient Has Paino Yes Site Locations Rate the pain. Current Pain Level: 7 Pain Management and Medication Current Pain Management: Notes pt states pain in left leg Electronic Signature(s) Signed: 01/25/2022 5:33:03 PM By: Levora Dredge Entered By: Levora Dredge on 01/25/2022 09:08:19 Laura Santiago ([id number]) -------------------------------------------------------------------------------- Patient/Caregiver Education Details Patient Name: Laura Santiago. Date of Service: 01/25/2022 9:00 AM Medical Record Number: [id number] Patient Account Number: [id number] Date of Birth/Gender: 11/14/1946 (76 y.o. F) Treating RN: Levora Dredge Primary Care Physician: Orvis Brill, DOCTORS Other Clinician: Referring Physician: Orvis Brill, DOCTORS Treating Physician/Extender: Yaakov Guthrie in Treatment: 46 Education Assessment Education Provided To: Patient Education Topics Provided Wound/Skin Impairment: Handouts: Caring for Your Ulcer Methods: Explain/Verbal Responses: State content correctly Electronic Signature(s) Signed: 01/25/2022 5:33:03 PM By: Levora Dredge Entered By: Levora Dredge on 01/25/2022 10:13:33 Laura Santiago ([id number]) -------------------------------------------------------------------------------- Wound Assessment Details Patient Name: Laura Santiago. Date of Service: 01/25/2022 9:00 AM Medical Record Number: [id number] Patient Account Number: [id number] Date of Birth/Sex: 1946/09/16 (76 y.o. F) Treating RN: Levora Dredge Primary Care Kentrel Clevenger: HOUSECALLS, DOCTORS Other Clinician: Referring  Tymon Nemetz: Orvis Brill, DOCTORS Treating Marciel Offenberger/Extender: Yaakov Guthrie in Treatment: 29 Wound Status Wound Number: 1 Primary Venous Leg Ulcer Etiology: Wound Location: Left, Medial Lower Leg Wound Status: Open Wounding Event: Gradually Appeared Comorbid Hypertension,  Osteoarthritis, Received Chemotherapy, Date Acquired: 04/06/2021 History: Received Radiation Weeks Of Treatment: 29 Clustered Wound: Yes Photos Wound Measurements Length: (cm) 9 Width: (cm) 10 Depth: (cm) 0.1 Area: (cm) 70.686 Volume: (cm) 7.069 % Reduction in Area: 21.7% % Reduction in Volume: 60.9% Epithelialization: Small (1-33%) Tunneling: No Undermining: No Wound Description Classification: Full Thickness Without Exposed Support Structu Wound Margin: Flat and Intact Exudate Amount: Large Exudate Type: Serosanguineous Exudate Color: red, brown res Foul Odor After Cleansing: No Slough/Fibrino Yes Wound Bed Granulation Amount: Small (1-33%) Exposed Structure Granulation Quality: Red, Pink Fascia Exposed: No Necrotic Amount: Medium (34-66%) Fat Layer (Subcutaneous Tissue) Exposed: Yes Necrotic Quality: Adherent Slough Tendon Exposed: No Muscle Exposed: No Joint Exposed: No Bone Exposed: No Treatment Notes Wound #1 (Lower Leg) Wound Laterality: Left, Medial Cleanser Soap and Water Discharge Instruction: Gently cleanse wound with antibacterial soap, rinse and pat dry prior to dressing wounds Peri-Wound Care DESTANI, WAMSER ([id number]) Topical Gentamicin Discharge Instruction: Apply as directed by Jerrold Haskell. Apply to wound bed with ketoconazole Triamcinolone Acetonide Cream, 0.1%, 15 (g) tube Discharge Instruction: Apply as directed by Librada Castronovo. Apply to leg Ketoconazole Cream 2%, 30 (g), tube Discharge Instruction: apply 1:1 ratio with gentamicin, apply to wound bed. Nystatin in office Primary Dressing Secondary Dressing Zetuvit Plus Silicone Non-bordered 5x5 (in/in) Secured With Coban Cohesive Bandage 4x5 (yds) Stretched Discharge Instruction: Apply Coban as directed. Please wrap from foot to just below knee to help swelling Kerlix Roll Sterile or Non-Sterile 6-ply 4.5x4 (yd/yd) Discharge Instruction: Apply Kerlix as directed Please wrap from foot to just  below knee, to help swelling Compression Wrap Compression Stockings Add-Ons Electronic Signature(s) Signed: 01/25/2022 5:33:03 PM By: Levora Dredge Entered By: Levora Dredge on 01/25/2022 09:15:37 Laura Santiago ([id number]) -------------------------------------------------------------------------------- Vitals Details Patient Name: Laura Santiago. Date of Service: 01/25/2022 9:00 AM Medical Record Number: [id number] Patient Account Number: [id number] Date of Birth/Sex: [birth year] (76 y.o. F) Treating RN: Levora Dredge Primary Care Jazara Swiney: Orvis Brill, DOCTORS Other Clinician: Referring Marajade Lei: HOUSECALLS, DOCTORS Treating Jaleya Pebley/Extender: Yaakov Guthrie in Treatment: 29 Vital Signs Time Taken: 09:05 Temperature (F): 98.9 Height (in): 66 Pulse (bpm): 91 Weight (lbs): 153 Respiratory Rate (breaths/min): 18 Body Mass Index (BMI): 24.7 Blood Pressure (mmHg): 137/75 Reference Range: 80 - 120 mg / dl Electronic Signature(s) Signed: 01/25/2022 5:33:03 PM By: Levora Dredge Entered By: Levora Dredge on 01/25/2022 09:07:31

## 2022-01-30 ENCOUNTER — Ambulatory Visit
Admission: RE | Admit: 2022-01-30 | Discharge: 2022-01-30 | Disposition: A | Discharge status: Home / Self Care | Payer: Medicare Other | Source: Ambulatory Visit | Attending: Oncology | Admitting: Oncology

## 2022-01-30 ENCOUNTER — Other Ambulatory Visit: Payer: Self-pay

## 2022-01-30 DIAGNOSIS — C7951 Secondary malignant neoplasm of bone: Secondary | ICD-10-CM | POA: Diagnosis present

## 2022-01-30 DIAGNOSIS — C50919 Malignant neoplasm of unspecified site of unspecified female breast: Secondary | ICD-10-CM | POA: Diagnosis present

## 2022-01-30 DIAGNOSIS — Z79899 Other long term (current) drug therapy: Secondary | ICD-10-CM | POA: Diagnosis present

## 2022-01-30 MED ORDER — IOHEXOL 300 MG/ML  SOLN
100.0000 mL | Freq: Once | INTRAMUSCULAR | Status: AC | PRN
  Administered 2022-01-30: 100 mL via INTRAVENOUS

## 2022-01-31 ENCOUNTER — Inpatient Hospital Stay: Payer: Medicare Other

## 2022-01-31 ENCOUNTER — Encounter: Payer: Self-pay | Admitting: Oncology

## 2022-01-31 ENCOUNTER — Inpatient Hospital Stay (HOSPITAL_BASED_OUTPATIENT_CLINIC_OR_DEPARTMENT_OTHER): Payer: Medicare Other | Admitting: Oncology

## 2022-01-31 VITALS — BP 114/64 | HR 71 | Temp 99.4°F | Resp 16 | Ht 62.0 in | Wt 146.7 lb

## 2022-01-31 DIAGNOSIS — C50919 Malignant neoplasm of unspecified site of unspecified female breast: Secondary | ICD-10-CM

## 2022-01-31 DIAGNOSIS — C779 Secondary and unspecified malignant neoplasm of lymph node, unspecified: Secondary | ICD-10-CM | POA: Diagnosis not present

## 2022-01-31 DIAGNOSIS — C7951 Secondary malignant neoplasm of bone: Secondary | ICD-10-CM

## 2022-01-31 DIAGNOSIS — Z95828 Presence of other vascular implants and grafts: Secondary | ICD-10-CM

## 2022-01-31 DIAGNOSIS — Z8042 Family history of malignant neoplasm of prostate: Secondary | ICD-10-CM

## 2022-01-31 DIAGNOSIS — I10 Essential (primary) hypertension: Secondary | ICD-10-CM

## 2022-01-31 DIAGNOSIS — Z5112 Encounter for antineoplastic immunotherapy: Secondary | ICD-10-CM

## 2022-01-31 DIAGNOSIS — C50911 Malignant neoplasm of unspecified site of right female breast: Secondary | ICD-10-CM

## 2022-01-31 DIAGNOSIS — Z5111 Encounter for antineoplastic chemotherapy: Secondary | ICD-10-CM | POA: Diagnosis not present

## 2022-01-31 DIAGNOSIS — Z800 Family history of malignant neoplasm of digestive organs: Secondary | ICD-10-CM

## 2022-01-31 DIAGNOSIS — Z808 Family history of malignant neoplasm of other organs or systems: Secondary | ICD-10-CM

## 2022-01-31 LAB — COMPREHENSIVE METABOLIC PANEL
ALT: 10 U/L (ref 0–44)
AST: 18 U/L (ref 15–41)
Albumin: 3.3 g/dL — ABNORMAL LOW (ref 3.5–5.0)
Alkaline Phosphatase: 56 U/L (ref 38–126)
Anion gap: 7 (ref 5–15)
BUN: 22 mg/dL (ref 8–23)
CO2: 25 mmol/L (ref 22–32)
Calcium: 8.4 mg/dL — ABNORMAL LOW (ref 8.9–10.3)
Chloride: 106 mmol/L (ref 98–111)
Creatinine, Ser: 1.35 mg/dL — ABNORMAL HIGH (ref 0.44–1.00)
GFR, Estimated: 40 mL/min — ABNORMAL LOW (ref >60)
Glucose, Bld: 117 mg/dL — ABNORMAL HIGH (ref 70–99)
Potassium: 3.7 mmol/L (ref 3.5–5.1)
Sodium: 138 mmol/L (ref 135–145)
Total Bilirubin: 0.4 mg/dL (ref 0.3–1.2)
Total Protein: 6.7 g/dL (ref 6.5–8.1)

## 2022-01-31 LAB — CBC WITH DIFFERENTIAL/PLATELET
Abs Immature Granulocytes: 0.02 10*3/uL (ref 0.00–0.07)
Basophils Absolute: 0 10*3/uL (ref 0.0–0.1)
Basophils Relative: 0 %
Eosinophils Absolute: 0.1 10*3/uL (ref 0.0–0.5)
Eosinophils Relative: 2 %
HCT: 33.1 % — ABNORMAL LOW (ref 36.0–46.0)
Hemoglobin: 9.9 g/dL — ABNORMAL LOW (ref 12.0–15.0)
Immature Granulocytes: 0 %
Lymphocytes Relative: 12 %
Lymphs Abs: 0.6 10*3/uL — ABNORMAL LOW (ref 0.7–4.0)
MCH: 27.7 pg (ref 26.0–34.0)
MCHC: 29.9 g/dL — ABNORMAL LOW (ref 30.0–36.0)
MCV: 92.5 fL (ref 80.0–100.0)
Monocytes Absolute: 0.3 10*3/uL (ref 0.1–1.0)
Monocytes Relative: 6 %
Neutro Abs: 4.3 10*3/uL (ref 1.7–7.7)
Neutrophils Relative %: 80 %
Platelets: 286 10*3/uL (ref 150–400)
RBC: 3.58 MIL/uL — ABNORMAL LOW (ref 3.87–5.11)
RDW: 16.7 % — ABNORMAL HIGH (ref 11.5–15.5)
WBC: 5.3 10*3/uL (ref 4.0–10.5)
nRBC: 0 % (ref 0.0–0.2)

## 2022-01-31 MED ORDER — SODIUM CHLORIDE 0.9 % IV SOLN
420.0000 mg | Freq: Once | INTRAVENOUS | Status: AC
  Administered 2022-01-31: 420 mg via INTRAVENOUS
  Filled 2022-01-31: qty 14

## 2022-01-31 MED ORDER — ACETAMINOPHEN 325 MG PO TABS
650.0000 mg | ORAL_TABLET | Freq: Once | ORAL | Status: AC
  Administered 2022-01-31: 650 mg via ORAL
  Filled 2022-01-31: qty 2

## 2022-01-31 MED ORDER — HEPARIN SOD (PORK) LOCK FLUSH 100 UNIT/ML IV SOLN
INTRAVENOUS | Status: AC
  Filled 2022-01-31: qty 5

## 2022-01-31 MED ORDER — TRASTUZUMAB-DKST CHEMO 150 MG IV SOLR
6.0000 mg/kg | Freq: Once | INTRAVENOUS | Status: AC
  Administered 2022-01-31: 420 mg via INTRAVENOUS
  Filled 2022-01-31: qty 20

## 2022-01-31 MED ORDER — HEPARIN SOD (PORK) LOCK FLUSH 100 UNIT/ML IV SOLN
500.0000 [IU] | Freq: Once | INTRAVENOUS | Status: DC | PRN
  Filled 2022-01-31: qty 5

## 2022-01-31 MED ORDER — SODIUM CHLORIDE 0.9 % IV SOLN
Freq: Once | INTRAVENOUS | Status: AC
  Filled 2022-01-31: qty 250

## 2022-01-31 MED ORDER — DIPHENHYDRAMINE HCL 25 MG PO CAPS
50.0000 mg | ORAL_CAPSULE | Freq: Once | ORAL | Status: AC
  Administered 2022-01-31: 50 mg via ORAL
  Filled 2022-01-31: qty 2

## 2022-01-31 NOTE — H&P (View-Only) (Signed)
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  She had a screening mammogram in September 2014 which showed 2.1 x 2.3 x 1.8 cm irregular mass in her right breast.  It was ER 10% positive PR negative and HER2 positive +3.  She received neoadjuvant chemotherapy with Taxol Herceptin and Perjeta for 4 cycles followed by dose dense AC/Herceptin x4 which she completed in March 2015.  She had a right lumpectomy on 04/03/2014 which showed scant residual invasive ductal carcinoma [id number].  She completed 1 year of adjuvant Herceptin chemotherapy and also completed adjuvant radiation treatment.  She was recommended anastrozole which she took on and off starting November 2015 and stopped sometime in 2020.   She was then hospitalized with neck pain and was found to have lytic lesions involving C5-C6 with pathological vertebral fractures.  She underwent radiation treatment to this area.  Image guided biopsy of the L1 vertebral body showed metastatic carcinoma consistent with breast origin ER 10% PR 0% and HER2 amplified ratio 5.1 average HER2 signal number per cell 15.0 average CEP 17 signals number per cell 3.0.  Baseline echocardiogram  on 05/02/2021 showed a normal EF of 62% she was recommended Taxol Herceptin and Perjeta which she received for 2 cycles at [facility] until June 10, 2021   She has chronic pain from her bone metastases for which she is currently on oxycodone 10 mg every 4 hours as needed and 12 mcg fentanyl patch.  She was seeing pain clinic when she was living in [name].   Patient has also been on Zometa when she was in [name] but she does have some ongoing dental issues.  She has received Xgeva in the past as well.  Her last Delton See was in July 2021.  Last PET scan was on 03/21/2021 which showed diffuse osseous metastatic disease involving the head neck chest abdomen and pelvis and spine.  Left lung apex hypermetabolic nodule and multiple hypermetabolic liver lesions concerning for disease involvement.  Enlarged hypermetabolic left inguinal lymph nodes along with hypermetabolic external iliac and left supraclavicular lymph nodes   Patient is now moved to [place] to be close to her daughter.  She lives in an independent living. Currently patient is getting Taxol Herceptin and Perjeta every 3 weeks with weekly Taxol    Interval history-patient has ongoing issues with chronic nonhealing ulcers in the left lower extremity.  She ambulates with a walker and has not had any recent falls.  Reports some difficulty with handgrip.  ECOG PS- 1 Pain scale- 0   Review of systems- Review of Systems  Constitutional:  Positive for malaise/fatigue. Negative for chills, fever and weight loss.  HENT:  Negative for congestion, ear discharge and nosebleeds.   Eyes:  Negative for blurred vision.  Respiratory:  Negative for cough, hemoptysis,  sputum production, shortness of breath and wheezing.   Cardiovascular:  Negative for chest pain, palpitations, orthopnea and claudication.  Gastrointestinal:  Negative for abdominal pain, blood in stool, constipation, diarrhea, heartburn, melena, nausea and vomiting.  Genitourinary:  Negative for  dysuria, flank pain, frequency, hematuria and urgency.  Musculoskeletal:  Negative for back pain, joint pain and myalgias.       Left leg ulcers  Skin:  Negative for rash.  Neurological:  Negative for dizziness, tingling, focal weakness, seizures, weakness and headaches.  Endo/Heme/Allergies:  Does not bruise/bleed easily.  Psychiatric/Behavioral:  Negative for depression and suicidal ideas. The patient does not have insomnia.      Allergies  Allergen Reactions   Corticosteroids Other (See Comments)    Pt trf from [name] and per primary md for her cancer tx. Notes that it causes agitation intolerance   Sulfa Antibiotics Other (See Comments)    Pt moved from [name] and in MD notes she has allergy but we do not know reactions when taking the drug   Celebrex [Celecoxib] Rash     Past Medical History:  Diagnosis Date   ADHD (attention deficit hyperactivity disorder)    in [place], no date on md note   Anemia    IDA 11/26/2019, Anemia in stage 4 chronic kidney disease ([place]) 09/03/2020   Arthritis    osteoarthritis right knee 09/30/2014   Breast cancer ([place]) 10/05/2013   in [place], [place] -, Her 2 is 3+   Cancer related pain 03/28/2021   in [name], md notes spine mets   DVT of lower extremity, bilateral ([place]) 03/30/2014   in [name]   Generalized muscle weakness 03/31/2016   in [name]   GERD (gastroesophageal reflux disease) 05/15/2013   per md in [name]   Hyperparathyroidism, secondary ([place]) 04/25/2018   in [name]   Hypertension 02/20/2016   info from MD in [facility]   Lumbar compression fracture ([place]) 03/18/2021   in [place] loss 02/05/2015   in [name]   Metabolic acidosis 64/15/8309   in [place]   Metabolic syndrome 40/76/8088   in [name]   Osteopenia after menopause 03/29/2016   in [place] of both eyes 09/30/2012   per md in [name] where pt. 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   in [name]   Stasis ulcer of left lower extremity ([place]) 03/29/2016   in [name]      Past Surgical History:  Procedure Laterality Date   CESAREAN SECTION     unknown   fibroid removed  N/A    in [place] - unknown date   IR FLUORO GUIDE CV LINE LEFT  07/27/2021   IR PORT REPAIR CENTRAL VENOUS ACCESS DEVICE Left    In [place] N/A 02/25/2014   in [name]   ovary removed      unknown   [place] CATARACT EXTRACAP,INSERT LENS Bilateral  Bilateral 09/05/2012   in [place]     unknown    LUMPECTOMY Right 04/03/2014   in [place]    Social History   Socioeconomic History   Marital status: Divorced    Spouse name: Not on file   Number of children: Not on file   Years of education: Not on file   Highest education level: Not on file  Occupational History   Occupation: retired Teacher, music    Comment: In [place]  Tobacco Use   Smoking status: Never  Smokeless tobacco: Never  Vaping Use   Vaping Use: Never used  Substance and Sexual Activity   Alcohol use: Not Currently   Drug use: Never   Sexual activity: Not Currently  Other Topics Concern   Not on file  Social History Narrative   Not on file   Social Determinants of Health   Financial Resource Strain: Not on file  Food Insecurity: Not on file  Transportation Needs: Not on file  Physical Activity: Not on file  Stress: Not on file  Social Connections: Not on file  Intimate Partner Violence: Not on file    Family History  Problem Relation Age of Onset   Pancreatic cancer Mother    Stroke Father    Diabetes Father    Hypertension Father    Heart disease Father    Skin cancer Father    Varicose Veins Father    Skin cancer Brother    Cancer - Prostate Brother      Current Outpatient Medications:    amphetamine-dextroamphetamine (ADDERALL XR) 30 MG 24 hr capsule, Take 1 capsule (30 mg total) by mouth daily., Disp: 15 capsule, Rfl: 0   Calcium 200 MG TABS, Take 1 tablet by mouth daily., Disp: , Rfl:    fentaNYL (DURAGESIC) 12 MCG/HR, Place 1 patch onto the skin  every 3 (three) days. 1 patch to skin. Next dose on 12/10/2021 at 10:40 pm, Disp: 3 patch, Rfl: 0   gabapentin (NEURONTIN) 400 MG capsule, Take 200 mg by mouth 2 (two) times daily., Disp: , Rfl:    hydrOXYzine (VISTARIL) 25 MG capsule, Take 50 mg by mouth every 8 (eight) hours as needed., Disp: , Rfl:    lisinopril (ZESTRIL) 20 MG tablet, Take 20 mg by mouth daily., Disp: , Rfl:    LORazepam (ATIVAN) 0.5 MG tablet, , Disp: , Rfl:    Multiple Vitamins-Minerals (MULTIVITAMIN ADULTS) TABS, Take 1 tablet by mouth daily., Disp: , Rfl:    ondansetron (ZOFRAN) 8 MG tablet, , Disp: , Rfl:    Oxycodone HCl 10 MG TABS, Take 1 tablet (10 mg total) by mouth every 4 (four) hours as needed., Disp: 90 tablet, Rfl: 0   zolpidem (AMBIEN) 5 MG tablet, , Disp: , Rfl:    acetaminophen (TYLENOL) 500 MG tablet, Take 500-1,000 mg by mouth every 6 (six) hours as needed for mild pain or moderate pain. (Patient not taking: Reported on 01/20/2022), Disp: , Rfl:    sodium hypochlorite (DAKIN'S 1/4 STRENGTH) 0.125 % SOLN, Apply 1 application topically as directed. Moisten gauze with solution and wrap wound (Patient not taking: Reported on 01/20/2022), Disp: , Rfl:    terbinafine (LAMISIL AT) 1 % cream, Apply 1 application topically 2 (two) times daily. (Patient not taking: Reported on 01/20/2022), Disp: 30 g, Rfl: 5 No current facility-administered medications for this visit.  Facility-Administered Medications Ordered in Other Visits:    heparin lock flush 100 UNIT/ML injection, , , ,    heparin lock flush 100 UNIT/ML injection, , , ,    heparin lock flush 100 unit/mL, 500 Units, Intracatheter, Once PRN, [name], MD   sodium chloride flush (NS) 0.9 % injection 10 mL, 10 mL, Intracatheter, PRN, [name], MD  Physical exam:  Vitals:   01/31/22 1332  BP: 114/64  Pulse: 71  Resp: 16  Temp: 99.4 F (37.4 C)  TempSrc: Tympanic  SpO2: 100%  Weight: 146 lb 11.2 oz (66.5 kg)  Height: 5' 2" (1.575 m)   Physical  Exam Constitutional:      General: She is not in acute distress.    Comments: Ambulates with a walker  Cardiovascular:     Rate and Rhythm: Normal rate and regular rhythm.     Heart sounds: Normal heart sounds.  Pulmonary:     Effort: Pulmonary effort is normal.     Breath sounds: Normal breath sounds.  Abdominal:     General: Bowel sounds are normal.     Palpations: Abdomen is soft.  Musculoskeletal:     Comments: Dressing in place over left lower extremity.  Mild erythema noted over right lower extremity overall appears improved  Skin:    General: Skin is warm and dry.  Neurological:     Mental Status: She is alert and oriented to person, place, and time.     CMP Latest Ref Rng & Units 01/31/2022  Glucose 70 - 99 mg/dL 117(H)  BUN 8 - 23 mg/dL 22  Creatinine 0.44 - 1.00 mg/dL 1.35(H)  Sodium 135 - 145 mmol/L 138  Potassium 3.5 - 5.1 mmol/L 3.7  Chloride 98 - 111 mmol/L 106  CO2 22 - 32 mmol/L 25  Calcium 8.9 - 10.3 mg/dL 8.4(L)  Total Protein 6.5 - 8.1 g/dL 6.7  Total Bilirubin 0.3 - 1.2 mg/dL 0.4  Alkaline Phos 38 - 126 U/L 56  AST 15 - 41 U/L 18  ALT 0 - 44 U/L 10   CBC Latest Ref Rng & Units 01/31/2022  WBC 4.0 - 10.5 K/uL 5.3  Hemoglobin 12.0 - 15.0 g/dL 9.9(L)  Hematocrit 36.0 - 46.0 % 33.1(L)  Platelets 150 - 400 K/uL 286    No images are attached to the encounter.  CT CHEST ABDOMEN PELVIS W CONTRAST  Result Date: 01/30/2022 CLINICAL DATA:  Metastatic breast cancer, osseous metastatic disease, assess treatment response EXAM: CT CHEST, ABDOMEN, AND PELVIS WITH CONTRAST TECHNIQUE: Multidetector CT imaging of the chest, abdomen and pelvis was performed following the standard protocol during bolus administration of intravenous contrast. RADIATION DOSE REDUCTION: This exam was performed according to the departmental dose-optimization program which includes automated exposure control, adjustment of the mA and/or kV according to patient size and/or use of iterative  reconstruction technique. CONTRAST:  163m OMNIPAQUE IOHEXOL 300 MG/ML SOLN, additional oral enteric contrast COMPARISON:  11/09/2021 FINDINGS: CT CHEST FINDINGS Cardiovascular: Left chest port catheter. Extensive superficial venous collateralization of contrast bolus about the chest wall and lower neck (series 2, image 20). Scattered aortic atherosclerosis. Normal heart size. No pericardial effusion. Mediastinum/Nodes: No enlarged mediastinal, hilar, or axillary lymph nodes. Thyroid gland, trachea, and esophagus demonstrate no significant findings. Lungs/Pleura: Minimal subpleural radiation fibrosis of the anterior right upper and right middle lobes (series 3, image 64). Minimal paramedian radiation fibrosis of the apices (series 3, image 32). Stable, benign 0.3 cm nodule of the azygoesophageal recess of the right lower lobe (series 3, image 68). No pleural effusion or pneumothorax. Musculoskeletal: No chest wall mass. Status post right lumpectomy and axillary lymph node dissection. CT ABDOMEN PELVIS FINDINGS Hepatobiliary: No solid liver abnormality is seen. No gallstones, gallbladder wall thickening, or biliary dilatation. Pancreas: Unremarkable. No pancreatic ductal dilatation or surrounding inflammatory changes. Spleen: Normal in size without significant abnormality. Adrenals/Urinary Tract: Adrenal glands are unremarkable. Kidneys are normal, without renal calculi, solid lesion, or hydronephrosis. Bladder is unremarkable. Stomach/Bowel: Stomach is within normal limits. Appendix is not clearly visualized and may be surgically absent. No evidence of bowel wall thickening, distention, or inflammatory changes. Large burden of stool and stool  balls throughout the colon and rectum. Vascular/Lymphatic: Aortic atherosclerosis. Numerous enlarged bilateral inguinal, iliac, and pelvic sidewall lymph nodes are not significantly changed, largest index left inguinal node again measuring 2.3 x 1.7 cm (series 2, image 111).  Reproductive: No mass or other abnormality. Other: No abdominal wall hernia or abnormality. No ascites. Musculoskeletal: No acute osseous findings. Unchanged post treatment appearance of widespread sclerotic osseous metastatic disease. IMPRESSION: 1. Unchanged post treatment appearance of widespread sclerotic osseous metastatic disease. 2. Numerous enlarged bilateral inguinal, iliac, and pelvic sidewall lymph nodes are not significantly changed, and remain highly suspicious for nodal metastatic disease. As previously reported, lymphoma remains a general differential consideration. 3. No evidence of new metastatic disease in the chest, abdomen, or pelvis. 4. Left chest port catheter. 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 Aortic Atherosclerosis (ICD10-I70.0). Electronically Signed   By: Delanna Ahmadi M.D.   On: 01/30/2022 16:06     Assessment and plan- Patient is a 76 y.o. female with metastatic weakly ER positive HER2 positive breast cancer with liver lymph node and bone metastases.  She is here to discuss CT scan results and further management and on treatment assessment prior to cycle 11 of Herceptin and Perjeta  I have reviewed CT chest as well as bone scan and CT abdomen images independently and discussed findings with the patient.  She has overall stable evidence of bony metastatic disease as well as intra-abdominal adenopathy.  No liver mets.  No overt radiological signs of disease progression.  My plan is to hold off on giving her any further Taxol especially given her worsening neuropathy and handgrip and proceed with Herceptin and Perjeta alone.  She will proceed with cycle 11 of Herceptin and Perjeta today.  She will proceed with cycle 12 of Herceptin and Perjeta in 3 weeks  and I will see her in 6 weeks for cycle 13. 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  I will refer her to vascular surgery to see if her chest wall port on the left side can come out and a new one placed on the right side.  We have not been able to get blood draw from the port but able to flush it easily.  She will receive her treatment peripherally today.  Serum calcium is presently low at 8.4 and therefore we will hold off on bisphosphonates today    Visit Diagnosis 1. Metastatic breast cancer ([place])   2. Port-A-Cath in place   3. Encounter for monoclonal antibody treatment for malignancy      Dr. Randa Evens, MD, MPH [facility] [phone] 01/31/2022 4:27 PM

## 2022-01-31 NOTE — Progress Notes (Signed)
01/30/2022 scan results reviewed with MD. Per MD to proceed with treatment through peripheral IV instead of port. Vascular referral placed by Moishe Spice, RN. RN made pt aware that vein and vascular will call her to make an appointment. Vein and vascular office number given to patient with AVS. Pt tolerate treatment well through peripheral IV, blood return noted multiple times prior to infusion and post infusion. RN educated pt on the importance of notifying the clinic if any complications occur at home, pt verbalized understanding and all questions answered at this time. VSS. Pt stable for discharge.   Cassy Sprowl [employer]

## 2022-01-31 NOTE — Progress Notes (Signed)
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  She had a screening mammogram in September 2014 which showed 2.1 x 2.3 x 1.8 cm irregular mass in her right breast.  It was ER 10% positive PR negative and HER2 positive +3.  She received neoadjuvant chemotherapy with Taxol Herceptin and Perjeta for 4 cycles followed by dose dense AC/Herceptin x4 which she completed in March 2015.  She had a right lumpectomy on 04/03/2014 which showed scant residual invasive ductal carcinoma [id number].  She completed 1 year of adjuvant Herceptin chemotherapy and also completed adjuvant radiation treatment.  She was recommended anastrozole which she took on and off starting November 2015 and stopped sometime in 2020.   She was then hospitalized with neck pain and was found to have lytic lesions involving C5-C6 with pathological vertebral fractures.  She underwent radiation treatment to this area.  Image guided biopsy of the L1 vertebral body showed metastatic carcinoma consistent with breast origin ER 10% PR 0% and HER2 amplified ratio 5.1 average HER2 signal number per cell 15.0 average CEP 17 signals number per cell 3.0.  Baseline echocardiogram  on 05/02/2021 showed a normal EF of 62% she was recommended Taxol Herceptin and Perjeta which she received for 2 cycles at [facility] until June 10, 2021   She has chronic pain from her bone metastases for which she is currently on oxycodone 10 mg every 4 hours as needed and 12 mcg fentanyl patch.  She was seeing pain clinic when she was living in [name].   Patient has also been on Zometa when she was in [name] but she does have some ongoing dental issues.  She has received Xgeva in the past as well.  Her last Delton See was in July 2021.  Last PET scan was on 03/21/2021 which showed diffuse osseous metastatic disease involving the head neck chest abdomen and pelvis and spine.  Left lung apex hypermetabolic nodule and multiple hypermetabolic liver lesions concerning for disease involvement.  Enlarged hypermetabolic left inguinal lymph nodes along with hypermetabolic external iliac and left supraclavicular lymph nodes   Patient is now moved to [place] to be close to her daughter.  She lives in an independent living. Currently patient is getting Taxol Herceptin and Perjeta every 3 weeks with weekly Taxol    Interval history-patient has ongoing issues with chronic nonhealing ulcers in the left lower extremity.  She ambulates with a walker and has not had any recent falls.  Reports some difficulty with handgrip.  ECOG PS- 1 Pain scale- 0   Review of systems- Review of Systems  Constitutional:  Positive for malaise/fatigue. Negative for chills, fever and weight loss.  HENT:  Negative for congestion, ear discharge and nosebleeds.   Eyes:  Negative for blurred vision.  Respiratory:  Negative for cough, hemoptysis,  sputum production, shortness of breath and wheezing.   Cardiovascular:  Negative for chest pain, palpitations, orthopnea and claudication.  Gastrointestinal:  Negative for abdominal pain, blood in stool, constipation, diarrhea, heartburn, melena, nausea and vomiting.  Genitourinary:  Negative for  dysuria, flank pain, frequency, hematuria and urgency.  Musculoskeletal:  Negative for back pain, joint pain and myalgias.       Left leg ulcers  Skin:  Negative for rash.  Neurological:  Negative for dizziness, tingling, focal weakness, seizures, weakness and headaches.  Endo/Heme/Allergies:  Does not bruise/bleed easily.  Psychiatric/Behavioral:  Negative for depression and suicidal ideas. The patient does not have insomnia.      Allergies  Allergen Reactions   Corticosteroids Other (See Comments)    Pt trf from [name] and per primary md for her cancer tx. Notes that it causes agitation intolerance   Sulfa Antibiotics Other (See Comments)    Pt moved from [name] and in MD notes she has allergy but we do not know reactions when taking the drug   Celebrex [Celecoxib] Rash     Past Medical History:  Diagnosis Date   ADHD (attention deficit hyperactivity disorder)    in [place], no date on md note   Anemia    IDA 11/26/2019, Anemia in stage 4 chronic kidney disease ([place]) 09/03/2020   Arthritis    osteoarthritis right knee 09/30/2014   Breast cancer ([place]) 10/05/2013   in [place], [place] -, Her 2 is 3+   Cancer related pain 03/28/2021   in [name], md notes spine mets   DVT of lower extremity, bilateral ([place]) 03/30/2014   in [name]   Generalized muscle weakness 03/31/2016   in [name]   GERD (gastroesophageal reflux disease) 05/15/2013   per md in [name]   Hyperparathyroidism, secondary ([place]) 04/25/2018   in [name]   Hypertension 02/20/2016   info from MD in [facility]   Lumbar compression fracture ([place]) 03/18/2021   in [place] loss 02/05/2015   in [name]   Metabolic acidosis 64/15/8309   in [place]   Metabolic syndrome 40/76/8088   in [name]   Osteopenia after menopause 03/29/2016   in [place] of both eyes 09/30/2012   per md in [name] where pt. 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   in [name]   Stasis ulcer of left lower extremity ([place]) 03/29/2016   in [name]      Past Surgical History:  Procedure Laterality Date   CESAREAN SECTION     unknown   fibroid removed  N/A    in [place] - unknown date   IR FLUORO GUIDE CV LINE LEFT  07/27/2021   IR PORT REPAIR CENTRAL VENOUS ACCESS DEVICE Left    In [place] N/A 02/25/2014   in [name]   ovary removed      unknown   [place] CATARACT EXTRACAP,INSERT LENS Bilateral  Bilateral 09/05/2012   in [place]     unknown    LUMPECTOMY Right 04/03/2014   in [place]    Social History   Socioeconomic History   Marital status: Divorced    Spouse name: Not on file   Number of children: Not on file   Years of education: Not on file   Highest education level: Not on file  Occupational History   Occupation: retired Teacher, music    Comment: In [place]  Tobacco Use   Smoking status: Never  Smokeless tobacco: Never  °Vaping Use  ° Vaping Use: Never used  °Substance and Sexual Activity  ° Alcohol use: Not Currently  ° Drug use: Never  ° Sexual activity: Not Currently  °Other Topics Concern  ° Not on file  °Social History Narrative  ° Not on file  ° °Social Determinants of Health  ° °Financial Resource Strain: Not on file  °Food Insecurity: Not on file  °Transportation Needs: Not on file  °Physical Activity: Not on file  °Stress: Not on file  °Social Connections: Not on file  °Intimate Partner Violence: Not on file  ° ° °Family History  °Problem Relation Age of Onset  ° Pancreatic cancer Mother   ° Stroke Father   ° Diabetes Father   ° Hypertension Father   ° Heart disease Father   ° Skin cancer Father   ° Varicose Veins Father   ° Skin cancer Brother   ° Cancer - Prostate Brother   ° ° ° °Current Outpatient Medications:  °  amphetamine-dextroamphetamine (ADDERALL XR) 30 MG 24 hr capsule, Take 1 capsule (30 mg total) by mouth daily., Disp: 15 capsule, Rfl: 0 °  Calcium 200 MG TABS, Take 1 tablet by mouth daily., Disp: , Rfl:  °  fentaNYL (DURAGESIC) 12 MCG/HR, Place 1 patch onto the skin  every 3 (three) days. 1 patch to skin. Next dose on 12/10/2021 at 10:40 pm, Disp: 3 patch, Rfl: 0 °  gabapentin (NEURONTIN) 400 MG capsule, Take 200 mg by mouth 2 (two) times daily., Disp: , Rfl:  °  hydrOXYzine (VISTARIL) 25 MG capsule, Take 50 mg by mouth every 8 (eight) hours as needed., Disp: , Rfl:  °  lisinopril (ZESTRIL) 20 MG tablet, Take 20 mg by mouth daily., Disp: , Rfl:  °  LORazepam (ATIVAN) 0.5 MG tablet, , Disp: , Rfl:  °  Multiple Vitamins-Minerals (MULTIVITAMIN ADULTS) TABS, Take 1 tablet by mouth daily., Disp: , Rfl:  °  ondansetron (ZOFRAN) 8 MG tablet, , Disp: , Rfl:  °  Oxycodone HCl 10 MG TABS, Take 1 tablet (10 mg total) by mouth every 4 (four) hours as needed., Disp: 90 tablet, Rfl: 0 °  zolpidem (AMBIEN) 5 MG tablet, , Disp: , Rfl:  °  acetaminophen (TYLENOL) 500 MG tablet, Take 500-1,000 mg by mouth every 6 (six) hours as needed for mild pain or moderate pain. (Patient not taking: Reported on 01/20/2022), Disp: , Rfl:  °  sodium hypochlorite (DAKIN'S 1/4 STRENGTH) 0.125 % SOLN, Apply 1 application topically as directed. Moisten gauze with solution and wrap wound (Patient not taking: Reported on 01/20/2022), Disp: , Rfl:  °  terbinafine (LAMISIL AT) 1 % cream, Apply 1 application topically 2 (two) times daily. (Patient not taking: Reported on 01/20/2022), Disp: 30 g, Rfl: 5 °No current facility-administered medications for this visit. ° °Facility-Administered Medications Ordered in Other Visits:  °  heparin lock flush 100 UNIT/ML injection, , , ,  °  heparin lock flush 100 UNIT/ML injection, , , ,  °  heparin lock flush 100 unit/mL, 500 Units, Intracatheter, Once PRN, Taquilla Downum C, MD °  sodium chloride flush (NS) 0.9 % injection 10 mL, 10 mL, Intracatheter, PRN, Tzipporah Nagorski C, MD ° °Physical exam:  °Vitals:  ° 01/31/22 1332  °BP: 114/64  °Pulse: 71  °Resp: 16  °Temp: 99.4 °F (37.4 °C)  °TempSrc: Tympanic  °SpO2: 100%  °Weight: 146 lb 11.2 oz (66.5 kg)  °Height: 5' 2" (1.575 m)  ° °Physical    Exam °Constitutional:   °   General: She is not in acute distress. °   Comments: Ambulates with a walker  °Cardiovascular:  °   Rate and Rhythm: Normal rate and regular rhythm.  °   Heart sounds: Normal heart sounds.  °Pulmonary:  °   Effort: Pulmonary effort is normal.  °   Breath sounds: Normal breath sounds.  °Abdominal:  °   General: Bowel sounds are normal.  °   Palpations: Abdomen is soft.  °Musculoskeletal:  °   Comments: Dressing in place over left lower extremity.  Mild erythema noted over right lower extremity overall appears improved  °Skin: °   General: Skin is warm and dry.  °Neurological:  °   Mental Status: She is alert and oriented to person, place, and time.  °  ° °CMP Latest Ref Rng & Units 01/31/2022  °Glucose 70 - 99 mg/dL 117(H)  °BUN 8 - 23 mg/dL 22  °Creatinine 0.44 - 1.00 mg/dL 1.35(H)  °Sodium 135 - 145 mmol/L 138  °Potassium 3.5 - 5.1 mmol/L 3.7  °Chloride 98 - 111 mmol/L 106  °CO2 22 - 32 mmol/L 25  °Calcium 8.9 - 10.3 mg/dL 8.4(L)  °Total Protein 6.5 - 8.1 g/dL 6.7  °Total Bilirubin 0.3 - 1.2 mg/dL 0.4  °Alkaline Phos 38 - 126 U/L 56  °AST 15 - 41 U/L 18  °ALT 0 - 44 U/L 10  ° °CBC Latest Ref Rng & Units 01/31/2022  °WBC 4.0 - 10.5 K/uL 5.3  °Hemoglobin 12.0 - 15.0 g/dL 9.9(L)  °Hematocrit 36.0 - 46.0 % 33.1(L)  °Platelets 150 - 400 K/uL 286  ° ° °No images are attached to the encounter. ° °CT CHEST ABDOMEN PELVIS W CONTRAST ° °Result Date: 01/30/2022 °CLINICAL DATA:  Metastatic breast cancer, osseous metastatic disease, assess treatment response EXAM: CT CHEST, ABDOMEN, AND PELVIS WITH CONTRAST TECHNIQUE: Multidetector CT imaging of the chest, abdomen and pelvis was performed following the standard protocol during bolus administration of intravenous contrast. RADIATION DOSE REDUCTION: This exam was performed according to the departmental dose-optimization program which includes automated exposure control, adjustment of the mA and/or kV according to patient size and/or use of iterative  reconstruction technique. CONTRAST:  100mL OMNIPAQUE IOHEXOL 300 MG/ML SOLN, additional oral enteric contrast COMPARISON:  11/09/2021 FINDINGS: CT CHEST FINDINGS Cardiovascular: Left chest port catheter. Extensive superficial venous collateralization of contrast bolus about the chest wall and lower neck (series 2, image 20). Scattered aortic atherosclerosis. Normal heart size. No pericardial effusion. Mediastinum/Nodes: No enlarged mediastinal, hilar, or axillary lymph nodes. Thyroid gland, trachea, and esophagus demonstrate no significant findings. Lungs/Pleura: Minimal subpleural radiation fibrosis of the anterior right upper and right middle lobes (series 3, image 64). Minimal paramedian radiation fibrosis of the apices (series 3, image 32). Stable, benign 0.3 cm nodule of the azygoesophageal recess of the right lower lobe (series 3, image 68). No pleural effusion or pneumothorax. Musculoskeletal: No chest wall mass. Status post right lumpectomy and axillary lymph node dissection. CT ABDOMEN PELVIS FINDINGS Hepatobiliary: No solid liver abnormality is seen. No gallstones, gallbladder wall thickening, or biliary dilatation. Pancreas: Unremarkable. No pancreatic ductal dilatation or surrounding inflammatory changes. Spleen: Normal in size without significant abnormality. Adrenals/Urinary Tract: Adrenal glands are unremarkable. Kidneys are normal, without renal calculi, solid lesion, or hydronephrosis. Bladder is unremarkable. Stomach/Bowel: Stomach is within normal limits. Appendix is not clearly visualized and may be surgically absent. No evidence of bowel wall thickening, distention, or inflammatory changes. Large burden of stool and stool   balls throughout the colon and rectum. Vascular/Lymphatic: Aortic atherosclerosis. Numerous enlarged bilateral inguinal, iliac, and pelvic sidewall lymph nodes are not significantly changed, largest index left inguinal node again measuring 2.3 x 1.7 cm (series 2, image 111).  Reproductive: No mass or other abnormality. Other: No abdominal wall hernia or abnormality. No ascites. Musculoskeletal: No acute osseous findings. Unchanged post treatment appearance of widespread sclerotic osseous metastatic disease. IMPRESSION: 1. Unchanged post treatment appearance of widespread sclerotic osseous metastatic disease. 2. Numerous enlarged bilateral inguinal, iliac, and pelvic sidewall lymph nodes are not significantly changed, and remain highly suspicious for nodal metastatic disease. As previously reported, lymphoma remains a general differential consideration. 3. No evidence of new metastatic disease in the chest, abdomen, or pelvis. 4. Left chest port catheter. 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 Aortic Atherosclerosis (ICD10-I70.0). Electronically Signed   By: Delanna Ahmadi M.D.   On: 01/30/2022 16:06     Assessment and plan- Patient is a 76 y.o. female with metastatic weakly ER positive HER2 positive breast cancer with liver lymph node and bone metastases.  She is here to discuss CT scan results and further management and on treatment assessment prior to cycle 11 of Herceptin and Perjeta  I have reviewed CT chest as well as bone scan and CT abdomen images independently and discussed findings with the patient.  She has overall stable evidence of bony metastatic disease as well as intra-abdominal adenopathy.  No liver mets.  No overt radiological signs of disease progression.  My plan is to hold off on giving her any further Taxol especially given her worsening neuropathy and handgrip and proceed with Herceptin and Perjeta alone.  She will proceed with cycle 11 of Herceptin and Perjeta today.  She will proceed with cycle 12 of Herceptin and Perjeta in 3 weeks  and I will see her in 6 weeks for cycle 13. 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  I will refer her to vascular surgery to see if her chest wall port on the left side can come out and a new one placed on the right side.  We have not been able to get blood draw from the port but able to flush it easily.  She will receive her treatment peripherally today.  Serum calcium is presently low at 8.4 and therefore we will hold off on bisphosphonates today    Visit Diagnosis 1. Metastatic breast cancer ([place])   2. Port-A-Cath in place   3. Encounter for monoclonal antibody treatment for malignancy      Dr. Randa Evens, MD, MPH [facility] [phone] 01/31/2022 4:27 PM

## 2022-02-01 ENCOUNTER — Other Ambulatory Visit: Payer: Self-pay

## 2022-02-01 ENCOUNTER — Encounter (HOSPITAL_BASED_OUTPATIENT_CLINIC_OR_DEPARTMENT_OTHER): Payer: Medicare Other | Admitting: Internal Medicine

## 2022-02-01 ENCOUNTER — Telehealth: Payer: Self-pay | Admitting: *Deleted

## 2022-02-01 ENCOUNTER — Encounter: Payer: Medicare Other | Admitting: Internal Medicine

## 2022-02-01 DIAGNOSIS — L97822 Non-pressure chronic ulcer of other part of left lower leg with fat layer exposed: Secondary | ICD-10-CM

## 2022-02-01 MED ORDER — RIVAROXABAN 20 MG PO TABS
20.0000 mg | ORAL_TABLET | Freq: Every day | ORAL | 3 refills | Status: DC
Start: 2022-02-01 — End: 2022-03-14

## 2022-02-01 NOTE — Telephone Encounter (Signed)
Told daughter that Dr. Janese Banks wanted pt. To start back on xaerlto every day. She asked me to send in rx and I did. I also told [name] that I put in vascular appt to see if her port can be fixed or a new one put in because I it does not get blood return anymore. She states to call her when she gets the appt and she will let her mom know. I will let her know and she will get in couch with her mom for xarelto to start back

## 2022-02-01 NOTE — Progress Notes (Signed)
Laura Santiago ([id number]) Visit Report for 02/01/2022 Arrival Information Details Patient Name: Laura Santiago. Date of Service: 02/01/2022 9:00 AM Medical Record Number: [id number] Patient Account Number: [id number] Date of Birth/Sex: [birth year] (76 y.o. F) Treating RN: Levora Dredge Primary Care Magenta Schmiesing: Orvis Brill, DOCTORS Other Clinician: Referring Million Maharaj: HOUSECALLS, DOCTORS Treating Alick Lecomte/Extender: Yaakov Guthrie in Treatment: 39 Visit Information History Since Last Visit Added or deleted any medications: No Patient Arrived: Walker Any new allergies or adverse reactions: No Arrival Time: 08:54 Had a fall or experienced change in No Accompanied By: self activities of daily living that may affect Transfer Assistance: None risk of falls: Patient Identification Verified: Yes Hospitalized since last visit: No Secondary Verification Process Completed: Yes Has Dressing in Place as Prescribed: Yes Patient Has Alerts: Yes Pain Present Now: Yes Patient Alerts: PT HAS SERVICE ANIMAL ABI 07/11/21 R) 1.16 L) 1.27 Electronic Signature(s) Signed: 02/01/2022 4:28:37 PM By: Levora Dredge Entered By: Levora Dredge on 02/01/2022 08:57:22 Laura Santiago ([id number]) -------------------------------------------------------------------------------- Clinic Level of Care Assessment Details Patient Name: Laura Santiago. Date of Service: 02/01/2022 9:00 AM Medical Record Number: [id number] Patient Account Number: [id number] Date of Birth/Sex: 1946-02-19 (76 y.o. F) Treating RN: Levora Dredge Primary Care Dorlis Judice: Orvis Brill, DOCTORS Other Clinician: Referring Sharnay Cashion: Orvis Brill, DOCTORS Treating Nadine Ryle/Extender: Yaakov Guthrie in Treatment: 30 Clinic Level of Care Assessment Items TOOL 1 Quantity Score []  - Use when EandM and Procedure is performed on INITIAL visit 0 ASSESSMENTS - Nursing Assessment / Reassessment []  - General Physical Exam (combine  w/ comprehensive assessment (listed just below) when performed on new 0 pt. evals) []  - 0 Comprehensive Assessment (HX, ROS, Risk Assessments, Wounds Hx, etc.) ASSESSMENTS - Wound and Skin Assessment / Reassessment []  - Dermatologic / Skin Assessment (not related to wound area) 0 ASSESSMENTS - Ostomy and/or Continence Assessment and Care []  - Incontinence Assessment and Management 0 []  - 0 Ostomy Care Assessment and Management (repouching, etc.) PROCESS - Coordination of Care []  - Simple Patient / Family Education for ongoing care 0 []  - 0 Complex (extensive) Patient / Family Education for ongoing care []  - 0 Staff obtains Programmer, systems, Records, Test Results / Process Orders []  - 0 Staff telephones HHA, Nursing Homes / Clarify orders / etc []  - 0 Routine Transfer to another Facility (non-emergent condition) []  - 0 Routine Hospital Admission (non-emergent condition) []  - 0 New Admissions / Biomedical engineer / Ordering NPWT, Apligraf, etc. []  - 0 Emergency Hospital Admission (emergent condition) PROCESS - Special Needs []  - Pediatric / Minor Patient Management 0 []  - 0 Isolation Patient Management []  - 0 Hearing / Language / Visual special needs []  - 0 Assessment of Community assistance (transportation, D/C planning, etc.) []  - 0 Additional assistance / Altered mentation []  - 0 Support Surface(s) Assessment (bed, cushion, seat, etc.) INTERVENTIONS - Miscellaneous []  - External ear exam 0 []  - 0 Patient Transfer (multiple staff / Civil Service fast streamer / Similar devices) []  - 0 Simple Staple / Suture removal (25 or less) []  - 0 Complex Staple / Suture removal (26 or more) []  - 0 Hypo/Hyperglycemic Management (do not check if billed separately) []  - 0 Ankle / Brachial Index (ABI) - do not check if billed separately Has the patient been seen at the hospital within the last three years: Yes Total Score: 0 Level Of Care: ____ Laura Santiago ([id number]) Electronic  Signature(s) Signed: 02/01/2022 4:28:37 PM By: Levora Dredge Entered By: Levora Dredge on 02/01/2022 09:38:34 Laura Santiago ([id number]) -------------------------------------------------------------------------------- Encounter Discharge  Information Details Patient Name: Laura Santiago. Date of Service: 02/01/2022 9:00 AM Medical Record Number: [id number] Patient Account Number: [id number] Date of Birth/Sex: [birth year] (76 y.o. F) Treating RN: Levora Dredge Primary Care Nikia Mangino: Orvis Brill, DOCTORS Other Clinician: Referring Erum Cercone: Orvis Brill, DOCTORS Treating Topacio Cella/Extender: Yaakov Guthrie in Treatment: 30 Encounter Discharge Information Items Post Procedure Vitals Discharge Condition: Stable Temperature (F): 98.3 Ambulatory Status: Walker Pulse (bpm): 81 Discharge Destination: Home Respiratory Rate (breaths/min): 18 Transportation: Private Auto Blood Pressure (mmHg): 129/73 Accompanied By: self Schedule Follow-up Appointment: Yes Clinical Summary of Care: Electronic Signature(s) Signed: 02/01/2022 4:28:37 PM By: Levora Dredge Entered By: Levora Dredge on 02/01/2022 09:39:48 Laura Santiago ([id number]) -------------------------------------------------------------------------------- Lower Extremity Assessment Details Patient Name: Laura Santiago. Date of Service: 02/01/2022 9:00 AM Medical Record Number: [id number] Patient Account Number: [id number] Date of Birth/Sex: 1946/01/10 (76 y.o. F) Treating RN: Levora Dredge Primary Care Lorry Furber: Orvis Brill, DOCTORS Other Clinician: Referring Esaias Cleavenger: HOUSECALLS, DOCTORS Treating Oshae Simmering/Extender: Yaakov Guthrie in Treatment: 30 Edema Assessment Assessed: [Left: No] [Right: No] Edema: [Left: Ye] [Right: s] Calf Left: Right: Point of Measurement: 29 cm From Medial Instep 36.8 cm Ankle Left: Right: Point of Measurement: 10 cm From Medial Instep 24.8 cm Vascular  Assessment Pulses: Dorsalis Pedis Palpable: [Left:Yes] Electronic Signature(s) Signed: 02/01/2022 4:28:37 PM By: Levora Dredge Entered By: Levora Dredge on 02/01/2022 09:07:25 Laura Santiago ([id number]) -------------------------------------------------------------------------------- Multi Wound Chart Details Patient Name: Laura Santiago. Date of Service: 02/01/2022 9:00 AM Medical Record Number: [id number] Patient Account Number: [id number] Date of Birth/Sex: 26-Jul-1946 (76 y.o. F) Treating RN: Levora Dredge Primary Care Shardai Star: Orvis Brill, DOCTORS Other Clinician: Referring Draeden Kellman: HOUSECALLS, DOCTORS Treating Annalyce Lanpher/Extender: Yaakov Guthrie in Treatment: 30 Vital Signs Height(in): 66 Pulse(bpm): 81 Weight(lbs): 153 Blood Pressure(mmHg): 129/73 Body Mass Index(BMI): 24.7 Temperature(F): 98.3 Respiratory Rate(breaths/min): 18 Photos: [N/A:N/A] Wound Location: Left, Medial Lower Leg N/A N/A Wounding Event: Gradually Appeared N/A N/A Primary Etiology: Venous Leg Ulcer N/A N/A Comorbid History: Hypertension, Osteoarthritis, N/A N/A Received Chemotherapy, Received Radiation Date Acquired: 04/06/2021 N/A N/A Weeks of Treatment: 30 N/A N/A Wound Status: Open N/A N/A Wound Recurrence: No N/A N/A Clustered Wound: Yes N/A N/A Measurements L x W x D (cm) 10.5x10.6x0.1 N/A N/A Area (cm) : 87.415 N/A N/A Volume (cm) : 8.741 N/A N/A % Reduction in Area: 3.20% N/A N/A % Reduction in Volume: 51.60% N/A N/A Classification: Full Thickness Without Exposed N/A N/A Support Structures Exudate Amount: Large N/A N/A Exudate Type: Serosanguineous N/A N/A Exudate Color: red, brown N/A N/A Wound Margin: Flat and Intact N/A N/A Granulation Amount: Large (67-100%) N/A N/A Granulation Quality: Red, Pink N/A N/A Necrotic Amount: Small (1-33%) N/A N/A Exposed Structures: Fat Layer (Subcutaneous Tissue): N/A N/A Yes Fascia: No Tendon: No Muscle: No Joint:  No Bone: No Epithelialization: Small (1-33%) N/A N/A Treatment Notes Electronic Signature(s) Signed: 02/01/2022 4:28:37 PM By: Levora Dredge Entered By: Levora Dredge on 02/01/2022 09:08:10 Laura Santiago, Laura Santiago ([id number]) Laura Santiago, Laura Santiago ([id number]) -------------------------------------------------------------------------------- Multi-Disciplinary Care Plan Details Patient Name: Laura Santiago, Laura Santiago. Date of Service: 02/01/2022 9:00 AM Medical Record Number: [id number] Patient Account Number: [id number] Date of Birth/Sex: 1946/11/19 (76 y.o. F) Treating RN: Levora Dredge Primary Care Kiree Dejarnette: Orvis Brill, DOCTORS Other Clinician: Referring Crysten Kaman: Orvis Brill, DOCTORS Treating Harbour Nordmeyer/Extender: Yaakov Guthrie in Treatment: 30 Active Inactive Wound/Skin Impairment Nursing Diagnoses: Impaired tissue integrity Goals: Patient/caregiver will verbalize understanding of skin care regimen Date Initiated: 07/06/2021 Date Inactivated: 07/27/2021 Target Resolution Date: 07/06/2021 Goal Status: Met Ulcer/skin breakdown will have a volume reduction of 30% by  week 4 Date Initiated: 07/06/2021 Date Inactivated: 10/12/2021 Target Resolution Date: 08/06/2021 Goal Status: Unmet Unmet Reason: cont tx Ulcer/skin breakdown will have a volume reduction of 50% by week 8 Date Initiated: 07/06/2021 Target Resolution Date: 09/06/2021 Goal Status: Active Ulcer/skin breakdown will have a volume reduction of 80% by week 12 Date Initiated: 07/06/2021 Target Resolution Date: 10/06/2021 Goal Status: Active Ulcer/skin breakdown will heal within 14 weeks Date Initiated: 07/06/2021 Target Resolution Date: 11/06/2021 Goal Status: Active Interventions: Assess patient/caregiver ability to obtain necessary supplies Assess patient/caregiver ability to perform ulcer/skin care regimen upon admission and as needed Assess ulceration(s) every visit Treatment Activities: Referred to DME Taryn Nave for dressing  supplies : 07/06/2021 Skin care regimen initiated : 07/06/2021 Notes: Electronic Signature(s) Signed: 02/01/2022 4:28:37 PM By: Levora Dredge Entered By: Levora Dredge on 02/01/2022 09:08:01 Laura Santiago ([id number]) -------------------------------------------------------------------------------- Pain Assessment Details Patient Name: Laura Santiago. Date of Service: 02/01/2022 9:00 AM Medical Record Number: [id number] Patient Account Number: [id number] Date of Birth/Sex: [birth year] (76 y.o. F) Treating RN: Levora Dredge Primary Care Demetrius Mahler: Orvis Brill, DOCTORS Other Clinician: Referring Jamicheal Heard: Orvis Brill, DOCTORS Treating Portia Wisdom/Extender: Yaakov Guthrie in Treatment: 30 Active Problems Location of Pain Severity and Description of Pain Patient Has Paino Yes Site Locations Rate the pain. Current Pain Level: 7 Pain Management and Medication Current Pain Management: Notes pt states pain at left leg wound Electronic Signature(s) Signed: 02/01/2022 4:28:37 PM By: Levora Dredge Entered By: Levora Dredge on 02/01/2022 08:59:25 Laura Santiago, Laura Santiago ([id number]) -------------------------------------------------------------------------------- Patient/Caregiver Education Details Patient Name: Laura Santiago. Date of Service: 02/01/2022 9:00 AM Medical Record Number: [id number] Patient Account Number: [id number] Date of Birth/Gender: 10-29-1946 (76 y.o. F) Treating RN: Levora Dredge Primary Care Physician: Orvis Brill, DOCTORS Other Clinician: Referring Physician: Orvis Brill, DOCTORS Treating Physician/Extender: Yaakov Guthrie in Treatment: 30 Education Assessment Education Provided To: Patient Education Topics Provided Wound/Skin Impairment: Handouts: Caring for Your Ulcer, Other: pt educated on keeping wrap dry Methods: Explain/Verbal Responses: State content correctly Electronic Signature(s) Signed: 02/01/2022 4:28:37 PM By: Levora Dredge Entered By: Levora Dredge on 02/01/2022 09:39:01 Laura Santiago ([id number]) -------------------------------------------------------------------------------- Wound Assessment Details Patient Name: Laura Santiago. Date of Service: 02/01/2022 9:00 AM Medical Record Number: [id number] Patient Account Number: [id number] Date of Birth/Sex: 1946/09/24 (76 y.o. F) Treating RN: Levora Dredge Primary Care Alona Danford: Orvis Brill, DOCTORS Other Clinician: Referring Tamani Durney: HOUSECALLS, DOCTORS Treating Clinten Howk/Extender: Yaakov Guthrie in Treatment: 30 Wound Status Wound Number: 1 Primary Venous Leg Ulcer Etiology: Wound Location: Left, Medial Lower Leg Wound Status: Open Wounding Event: Gradually Appeared Comorbid Hypertension, Osteoarthritis, Received Chemotherapy, Date Acquired: 04/06/2021 History: Received Radiation Weeks Of Treatment: 30 Clustered Wound: Yes Photos Wound Measurements Length: (cm) 10.5 Width: (cm) 10.6 Depth: (cm) 0.1 Area: (cm) 87.415 Volume: (cm) 8.741 % Reduction in Area: 3.2% % Reduction in Volume: 51.6% Epithelialization: Small (1-33%) Tunneling: No Undermining: No Wound Description Classification: Full Thickness Without Exposed Support Structu Wound Margin: Flat and Intact Exudate Amount: Large Exudate Type: Serosanguineous Exudate Color: red, brown res Foul Odor After Cleansing: No Slough/Fibrino Yes Wound Bed Granulation Amount: Large (67-100%) Exposed Structure Granulation Quality: Red, Pink Fascia Exposed: No Necrotic Amount: Small (1-33%) Fat Layer (Subcutaneous Tissue) Exposed: Yes Necrotic Quality: Adherent Slough Tendon Exposed: No Muscle Exposed: No Joint Exposed: No Bone Exposed: No Treatment Notes Wound #1 (Lower Leg) Wound Laterality: Left, Medial Cleanser Peri-Wound Care Topical Gentamicin Laura Santiago, Laura Santiago ([id number]) Discharge Instruction: Apply as directed by Lexiana Spindel. Apply to wound bed with  ketoconazole Triamcinolone Acetonide Cream, 0.1%, 15 (g) tube  Discharge Instruction: Apply as directed by Krishna Heuer. Apply to leg Ketoconazole Cream 2%, 30 (g), tube Discharge Instruction: apply 1:1 ratio with gentamicin, apply to wound bed. Nystatin in office Primary Dressing Cutimed Sorbact 1.5x 2.38 (in/in) Discharge Instruction: A bacteria- and fungi binding wound dressing, suitable for cavities and fistulas. It is suitable as a wound filler and allows the passage of wound exudate into a secondary dressing. The dressing helps reducing odor and pain and can improve healing. Secondary Dressing Zetuvit Plus Silicone Non-bordered 5x5 (in/in) Secured With Compression Wrap Profore Lite LF 3 Multilayer Compression Bandaging System Discharge Instruction: Apply 3 multi-layer wrap as prescribed. Compression Stockings Add-Ons Electronic Signature(s) Signed: 02/01/2022 4:28:37 PM By: Levora Dredge Entered By: Levora Dredge on 02/01/2022 09:06:50 Laura Santiago, Laura Santiago ([id number]) -------------------------------------------------------------------------------- Vitals Details Patient Name: Laura Santiago. Date of Service: 02/01/2022 9:00 AM Medical Record Number: [id number] Patient Account Number: [id number] Date of Birth/Sex: 01/21/1946 (76 y.o. F) Treating RN: Levora Dredge Primary Care Adel Burch: Orvis Brill, DOCTORS Other Clinician: Referring Sharlot Sturkey: HOUSECALLS, DOCTORS Treating Indya Oliveria/Extender: Yaakov Guthrie in Treatment: 30 Vital Signs Time Taken: 08:57 Temperature (F): 98.3 Height (in): 66 Pulse (bpm): 81 Weight (lbs): 153 Respiratory Rate (breaths/min): 18 Body Mass Index (BMI): 24.7 Blood Pressure (mmHg): 129/73 Reference Range: 80 - 120 mg / dl Electronic Signature(s) Signed: 02/01/2022 4:28:37 PM By: Levora Dredge Entered By: Levora Dredge on 02/01/2022 08:59:06

## 2022-02-01 NOTE — Progress Notes (Signed)
GAZELLA, ANGLIN ([id number]) Visit Report for 02/01/2022 Chief Complaint Document Details Patient Name: Laura Santiago, Laura Santiago. Date of Service: 02/01/2022 9:00 AM Medical Record Number: [id number] Patient Account Number: [id number] Date of Birth/Sex: 09/25/1946 (76 y.o. F) Treating RN: Levora Dredge Primary Care Provider: Orvis Brill, DOCTORS Other Clinician: Referring Provider: Orvis Brill, DOCTORS Treating Provider/Extender: Yaakov Guthrie in Treatment: 30 Information Obtained from: Patient Chief Complaint Left lower extremity wound Right toe wounds Left upper lateral thigh wounds Electronic Signature(s) Signed: 02/01/2022 9:25:24 AM By: Kalman Shan DO Entered By: Kalman Shan on 02/01/2022 09:19:32 Laura Santiago ([id number]) -------------------------------------------------------------------------------- Debridement Details Patient Name: Laura Santiago. Date of Service: 02/01/2022 9:00 AM Medical Record Number: [id number] Patient Account Number: [id number] Date of Birth/Sex: Jan 15, 1946 (76 y.o. F) Treating RN: Levora Dredge Primary Care Provider: Orvis Brill, DOCTORS Other Clinician: Referring Provider: Orvis Brill, DOCTORS Treating Provider/Extender: Yaakov Guthrie in Treatment: 30 Debridement Performed for Wound #1 Left,Medial Lower Leg Assessment: Performed By: Physician Kalman Shan, MD Debridement Type: Debridement Severity of Tissue Pre Debridement: Fat layer exposed Level of Consciousness (Pre- Awake and Alert procedure): Pre-procedure Verification/Time Out Yes - 09:15 Taken: Total Area Debrided (L x W): 10.5 (cm) x 10.8 (cm) = 113.4 (cm) Tissue and other material Viable, Non-Viable, Slough, Subcutaneous, Slough debrided: Level: Skin/Subcutaneous Tissue Debridement Description: Excisional Instrument: Curette Bleeding: Minimum Hemostasis Achieved: Pressure Response to Treatment: Procedure was tolerated well Level of  Consciousness (Post- Awake and Alert procedure): Post Debridement Measurements of Total Wound Length: (cm) 10.5 Width: (cm) 10.6 Depth: (cm) 0.1 Volume: (cm) 8.741 Character of Wound/Ulcer Post Debridement: Stable Severity of Tissue Post Debridement: Fat layer exposed Post Procedure Diagnosis Same as Pre-procedure Electronic Signature(s) Signed: 02/01/2022 9:25:24 AM By: Kalman Shan DO Signed: 02/01/2022 4:28:37 PM By: Levora Dredge Entered By: Levora Dredge on 02/01/2022 09:16:57 Laura Santiago ([id number]) -------------------------------------------------------------------------------- HPI Details Patient Name: Laura Santiago. Date of Service: 02/01/2022 9:00 AM Medical Record Number: [id number] Patient Account Number: [id number] Date of Birth/Sex: 04-30-1946 (76 y.o. F) Treating RN: Levora Dredge Primary Care Provider: Priscille Kluver Other Clinician: Referring Provider: Orvis Brill, DOCTORS Treating Provider/Extender: Yaakov Guthrie in Treatment: 30 History of Present Illness HPI Description: Admission 7/27 Laura Santiago is a 76 year old female with a past medical history of ADHD, metastatic breast cancer, stage IV chronic kidney disease, history of DVT on Xarelto and chronic venous insufficiency that presents to the clinic for a chronic left lower extremity wound. She recently moved to [facility] 4 days ago. She was being followed by wound care center in [name]. She reports a 10-year history of wounds to her left lower extremity that eventually do heal with debridement and compression therapy. She states that the current wound reopened 4 months ago and she is using Vaseline and Coban. She denies signs of infection. 8/3; patient presents for 1 week follow-up. She reports no issues or complaints today. She states she had vascular studies done in the last week. She denies signs of infection. She brought her little service dog with her  today. 8/17; patient presents for follow-up. She has missed her last clinic appointment. She states she took the wrap off and attempted to rewrap her leg. She is having difficulty with transportation. She has her service dog with her today. Overall she feels well and reports improvement in wound healing. She denies signs of infection. She reports owning an old Velcro wrap compression and has this at her living facility 9/14; patient presents for follow-up. Patient states that over the past 2 to  3 weeks she developed toe wounds to her right foot. She attributes this to tight fitting shoes. She subsequently developed cellulitis in the right leg and has been treated by doxycycline by her oncologist. She reports improvement in symptoms however continues to have some redness and swelling to this leg. To the left lower extremity patient has been having her wraps changed with home health twice weekly. She states that the [facility] is not helping control the drainage. Other than that she has no issues or complaints today. She denies signs of infection to the left lower extremity. 9/21; patient presents for follow-up. She reports seeing infectious disease for her cellulitis. She reports no further management. She has home health that changes the wraps twice weekly. She has no issues or complaints today. She denies signs of infection. 10/5; patient presents for follow-up. She has no issues or complaints today. She denies signs of infection. She states that the right great toe has not been dressed by home health. 10/12; patient presents for follow-up. She has no issues or complaints today. She reports improvement in her wound healing. She has been using silver alginate to the right great toe wound. She denies signs of infection. 10/26; patient presents for follow-up. Home health did not have sorbact so they continued to use Hydrofera Blue under the wrap. She has been using silver alginate to the great toe  wound however she did not have a dressing in place today. She currently denies signs of infection. 11/2; patient presents for follow-up. She has been using sorb act under the compression wrap. She reports using silver alginate to the toe wound again she does not have a dressing in place. She currently denies signs of infection. 11/23; patient presents for follow-up. Unfortunately she has missed her last 2 clinic appointments. She was last seen 3 weeks ago. She did her own compression wrap with Kerlix and Coban yesterday after seeing vein and vascular. She has not been dressing her right great toe wound. She currently denies signs of infection. 11/30; patient presents for 1 week follow-up. She states she changed her dressing last week prior to home health and use sorb act with Dakin's and Hydrofera Blue. Home health has changed the dressing as well and they have been using sorbact. Today she reports increased redness to her right lower extremity. She has a history of cellulitis to this leg. She has been using silver alginate to the right great toe. Unfortunately she had an episode of diarrhea prior to coming in and had feces all over the right leg and to the wrap of her left leg. 12/7; patient presents for 1 week follow-up. She states that home health did not come out to change the dressing and she took it off yesterday. It is unclear if she is dressing the right toe wound. She denies signs of infection. 12/14; patient presents for 1 week follow-up. She has no issues or complaints today. 12/21; patient presents for follow-up. She has no issues or complaints today. She denies signs of infection. 12/28/2021; patient presents for follow-up. She was hospitalized for sepsis secondary to right lower extremity cellulitis On 12/23. She states she is currently at a SNF. She states that she was started on doxycycline this morning for her right great toe swelling and redness. She is not sure what dressings have  been done to her left lower extremity for the past 3 weeks. She says its been mainly gauze with an Ace wrap. 1/25; patient presents for follow-up. She is  still residing in a skilled nursing facility. She reports mild pain to the left lower extremity wound bed. She states she is going to see a podiatrist soon. 2/8; patient presents for follow-up. She has moved back to her residential community from her skilled nursing facility. She has no issues or complaints today. She denies signs of systemic infections. 2/15; patient presents for follow-up. He has no issues or complaints today. She denies systemic signs of infection. 2/22; patient presents for follow-up. She has no issues or complaints today. She denies signs of infection. VERNESHA, TALBOT ([id number]) Electronic Signature(s) Signed: 02/01/2022 9:25:24 AM By: Kalman Shan DO Entered By: Kalman Shan on 02/01/2022 09:19:51 SHELLIE, ROGOFF ([id number]) -------------------------------------------------------------------------------- Physical Exam Details Patient Name: ZENITH, KERCHEVAL. Date of Service: 02/01/2022 9:00 AM Medical Record Number: [id number] Patient Account Number: [id number] Date of Birth/Sex: 03/27/1946 (76 y.o. F) Treating RN: Levora Dredge Primary Care Provider: Priscille Kluver Other Clinician: Referring Provider: HOUSECALLS, DOCTORS Treating Provider/Extender: Yaakov Guthrie in Treatment: 58 Constitutional . Cardiovascular . Psychiatric . Notes Left lower extremity: Large open wound to the medial aspect with Granulation tissue and nonviable tissue. No surrounding signs of infection. Electronic Signature(s) Signed: 02/01/2022 9:25:24 AM By: Kalman Shan DO Entered By: Kalman Shan on 02/01/2022 09:21:39 Laura Santiago ([id number]) -------------------------------------------------------------------------------- Physician Orders Details Patient Name: Laura Santiago. Date of  Service: 02/01/2022 9:00 AM Medical Record Number: [id number] Patient Account Number: [id number] Date of Birth/Sex: 12-23-1945 (76 y.o. F) Treating RN: Levora Dredge Primary Care Provider: Orvis Brill, DOCTORS Other Clinician: Referring Provider: Orvis Brill, DOCTORS Treating Provider/Extender: Yaakov Guthrie in Treatment: 30 Verbal / Phone Orders: No Diagnosis Coding Follow-up Appointments o Return Appointment in 2 weeks. o Nurse Visit as needed [place] for wound care. May utilize formulary equivalent dressing for wound treatment orders unless otherwise specified. Home Health Nurse may visit PR[facility] to address patientos wound care needs. o **Please direct any NON-WOUND related issues/requests for orders to patient's Primary Care Physician. **If current dressing causes regression in wound condition, may D/C ordered dressing product/s and apply Normal Saline Moist Dressing daily until next [place] or Other MD appointment. **Notify [facility]aling [facility] of regression in wound condition at [phone]. o Other Home Health Orders/Instructions: - Dressing change 3 x weekly, once by wound care and once at wound clinic weekly Bathing/ Shower/ Hygiene o May shower with wound dressing protected with water repellent cover or cast protector. o No tub bath. Anesthetic (Use 'Patient Medications' Section for Anesthetic Order Entry) o Lidocaine applied to wound bed Edema Control - Lymphedema / Segmental Compressive Device / Other o Optional: One layer of unna paste to top of compression wrap (to act as an anchor). o Elevate leg(s) parallel to the floor when sitting. o DO YOUR BEST to sleep in the bed at night. DO NOT sleep in your recliner. Long hours of sitting in a recliner leads to swelling of the legs and/or potential wounds on your backside. Additional Orders / Instructions o Follow Nutritious  Diet and Increase Protein Intake Wound Treatment Wound #1 - Lower Leg Wound Laterality: Left, Medial Topical: Gentamicin 3 x Per Week/30 Days Discharge Instructions: Apply as directed by provider. Apply to wound bed with ketoconazole Topical: Triamcinolone Acetonide Cream, 0.1%, 15 (g) tube 3 x Per Week/30 Days Discharge Instructions: Apply as directed by provider. Apply to leg Topical: Ketoconazole Cream 2%, 30 (g), tube 3 x Per Week/30 Days Discharge Instructions: apply 1:1  ratio with gentamicin, apply to wound bed. Nystatin in office Primary Dressing: Cutimed Sorbact 1.5x 2.38 (in/in) 3 x Per Week/30 Days Discharge Instructions: A bacteria- and fungi binding wound dressing, suitable for cavities and fistulas. It is suitable as a wound filler and allows the passage of wound exudate into a secondary dressing. The dressing helps reducing odor and pain and can improve healing. Secondary Dressing: Zetuvit Plus Silicone Non-bordered 5x5 (in/in) 3 x Per Week/30 Days Compression Wrap: Profore Lite LF 3 Multilayer Compression Bandaging System 3 x Per Week/30 Days Discharge Instructions: Apply 3 multi-layer wrap as prescribed. Electronic Signature(s) Signed: 02/01/2022 10:23:05 AM By: Kalman Shan DO Signed: 02/01/2022 4:28:37 PM By: Levora Dredge Previous Signature: 02/01/2022 9:25:24 AM Version By: Jake Shark ([id number]) Previous Signature: 02/01/2022 9:24:43 AM Version By: Kalman Shan DO Entered By: Levora Dredge on 02/01/2022 09:38:26 STANISHA, LORENZ ([id number]) -------------------------------------------------------------------------------- Problem List Details Patient Name: AANYAH, LOA. Date of Service: 02/01/2022 9:00 AM Medical Record Number: [id number] Patient Account Number: [id number] Date of Birth/Sex: 09-03-1946 (76 y.o. F) Treating RN: Levora Dredge Primary Care Provider: Orvis Brill, DOCTORS Other Clinician: Referring Provider:  Orvis Brill, DOCTORS Treating Provider/Extender: Yaakov Guthrie in Treatment: 30 Active Problems ICD-10 Encounter Code Description Active Date MDM Diagnosis L97.822 Non-pressure chronic ulcer of other part of left lower leg with fat layer 11/02/2021 No Yes exposed I87.312 Chronic venous hypertension (idiopathic) with ulcer of left lower 11/02/2021 No Yes extremity S91.201D Unspecified open wound of right great toe with damage to nail, 08/31/2021 No Yes subsequent encounter I87.2 Venous insufficiency (chronic) (peripheral) 07/06/2021 No Yes Z79.01 Long term (current) use of anticoagulants 07/06/2021 No Yes I10 Essential (primary) hypertension 07/06/2021 No Yes 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 07/06/2021 No Yes Inactive Problems ICD-10 Code Description Active Date Inactive Date S81.802A Unspecified open wound, left lower leg, initial encounter 07/06/2021 07/06/2021 S91.101A Unspecified open wound of right great toe without damage to nail, initial 08/24/2021 08/24/2021 encounter S91.104A Unspecified open wound of right lesser toe(s) without damage to nail, initial 08/24/2021 08/24/2021 encounter Resolved Problems ICD-10 ONNIKA, SIEBEL ([id number]) Code Description Active Date Resolved Date S91.104D Unspecified open wound of right lesser toe(s) without damage to nail, 08/31/2021 08/31/2021 subsequent encounter Electronic Signature(s) Signed: 02/01/2022 9:25:24 AM By: Kalman Shan DO Entered By: Kalman Shan on 02/01/2022 09:19:25 Laura Santiago ([id number]) -------------------------------------------------------------------------------- Progress Note Details Patient Name: Laura Santiago. Date of Service: 02/01/2022 9:00 AM Medical Record Number: [id number] Patient Account Number: [id number] Date of Birth/Sex: 1946-07-18 (75 y.o. F) Treating RN: Levora Dredge Primary Care Provider: Priscille Kluver Other Clinician: Referring Provider: Orvis Brill,  DOCTORS Treating Provider/Extender: Yaakov Guthrie in Treatment: 30 Subjective Chief Complaint Information obtained from Patient Left lower extremity wound Right toe wounds Left upper lateral thigh wounds History of Present Illness (HPI) Admission 7/27 Ms. Delicia Berens is a 76 year old female with a past medical history of ADHD, metastatic breast cancer, stage IV chronic kidney disease, history of DVT on Xarelto and chronic venous insufficiency that presents to the clinic for a chronic left lower extremity wound. She recently moved to [facility] 4 days ago. She was being followed by wound care center in [name]. She reports a 10-year history of wounds to her left lower extremity that eventually do heal with debridement and compression therapy. She states that the current wound reopened 4 months ago and she is using Vaseline and Coban. She denies signs of infection. 8/3; patient presents for 1 week follow-up. She reports no issues or  complaints today. She states she had vascular studies done in the last week. She denies signs of infection. She brought her little service dog with her today. 8/17; patient presents for follow-up. She has missed her last clinic appointment. She states she took the wrap off and attempted to rewrap her leg. She is having difficulty with transportation. She has her service dog with her today. Overall she feels well and reports improvement in wound healing. She denies signs of infection. She reports owning an old Velcro wrap compression and has this at her living facility 9/14; patient presents for follow-up. Patient states that over the past 2 to 3 weeks she developed toe wounds to her right foot. She attributes this to tight fitting shoes. She subsequently developed cellulitis in the right leg and has been treated by doxycycline by her oncologist. She reports improvement in symptoms however continues to have some redness and swelling to this  leg. To the left lower extremity patient has been having her wraps changed with home health twice weekly. She states that the [facility] is not helping control the drainage. Other than that she has no issues or complaints today. She denies signs of infection to the left lower extremity. 9/21; patient presents for follow-up. She reports seeing infectious disease for her cellulitis. She reports no further management. She has home health that changes the wraps twice weekly. She has no issues or complaints today. She denies signs of infection. 10/5; patient presents for follow-up. She has no issues or complaints today. She denies signs of infection. She states that the right great toe has not been dressed by home health. 10/12; patient presents for follow-up. She has no issues or complaints today. She reports improvement in her wound healing. She has been using silver alginate to the right great toe wound. She denies signs of infection. 10/26; patient presents for follow-up. Home health did not have sorbact so they continued to use Hydrofera Blue under the wrap. She has been using silver alginate to the great toe wound however she did not have a dressing in place today. She currently denies signs of infection. 11/2; patient presents for follow-up. She has been using sorb act under the compression wrap. She reports using silver alginate to the toe wound again she does not have a dressing in place. She currently denies signs of infection. 11/23; patient presents for follow-up. Unfortunately she has missed her last 2 clinic appointments. She was last seen 3 weeks ago. She did her own compression wrap with Kerlix and Coban yesterday after seeing vein and vascular. She has not been dressing her right great toe wound. She currently denies signs of infection. 11/30; patient presents for 1 week follow-up. She states she changed her dressing last week prior to home health and use sorb act with Dakin's and  Hydrofera Blue. Home health has changed the dressing as well and they have been using sorbact. Today she reports increased redness to her right lower extremity. She has a history of cellulitis to this leg. She has been using silver alginate to the right great toe. Unfortunately she had an episode of diarrhea prior to coming in and had feces all over the right leg and to the wrap of her left leg. 12/7; patient presents for 1 week follow-up. She states that home health did not come out to change the dressing and she took it off yesterday. It is unclear if she is dressing the right toe wound. She denies signs of infection. 12/14; patient  presents for 1 week follow-up. She has no issues or complaints today. 12/21; patient presents for follow-up. She has no issues or complaints today. She denies signs of infection. 12/28/2021; patient presents for follow-up. She was hospitalized for sepsis secondary to right lower extremity cellulitis On 12/23. She states she is currently at a SNF. She states that she was started on doxycycline this morning for her right great toe swelling and redness. She is not sure what dressings have been done to her left lower extremity for the past 3 weeks. She says its been mainly gauze with an Ace wrap. 1/25; patient presents for follow-up. She is still residing in a skilled nursing facility. She reports mild pain to the left lower extremity wound bed. She states she is going to see a podiatrist soon. 2/8; patient presents for follow-up. She has moved back to her residential community from her skilled nursing facility. She has no issues or complaints today. She denies signs of systemic infections. LUJAIN, KRASZEWSKI ([id number]) 2/15; patient presents for follow-up. He has no issues or complaints today. She denies systemic signs of infection. 2/22; patient presents for follow-up. She has no issues or complaints today. She denies signs of infection. Objective Constitutional Vitals  Time Taken: 8:57 AM, Height: 66 in, Weight: 153 lbs, BMI: 24.7, Temperature: 98.3 F, Pulse: 81 bpm, Respiratory Rate: 18 breaths/min, Blood Pressure: 129/73 mmHg. General Notes: Left lower extremity: Large open wound to the medial aspect with Granulation tissue and nonviable tissue. No surrounding signs of infection. Integumentary (Hair, Skin) Wound #1 status is Open. Original cause of wound was Gradually Appeared. The date acquired was: 04/06/2021. The wound has been in treatment 30 weeks. The wound is located on the Left,Medial Lower Leg. The wound measures 10.5cm length x 10.6cm width x 0.1cm depth; 87.415cm^2 area and 8.741cm^3 volume. There is Fat Layer (Subcutaneous Tissue) exposed. There is no tunneling or undermining noted. There is a large amount of serosanguineous drainage noted. The wound margin is flat and intact. There is large (67-100%) red, pink granulation within the wound bed. There is a small (1-33%) amount of necrotic tissue within the wound bed including Adherent Slough. Assessment Active Problems ICD-10 Non-pressure chronic ulcer of other part of left lower leg with fat layer exposed Chronic venous hypertension (idiopathic) with ulcer of left lower extremity Unspecified open wound of right great toe with damage to nail, subsequent encounter Venous insufficiency (chronic) (peripheral) Long term (current) use of anticoagulants Essential (primary) hypertension Secondary malignant neoplasm of breast Patient's wound has shown improvement in appearance since last clinic visit. I debrided non viable tissue. No signs of surrounding infection. She states she has completed her chemotherapy infusions. She stopped this 1 week ago. Today the wound has more granulation tissue present. I am hopeful that with stopping chemotherapy will see improvement in healing. I recommended continuing gentamicin and ketoconazole along with adding Sorbact and increasing her compression to 3 layer.  Follow-up in 1 week. 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 There was a Excisional Skin/Subcutaneous Tissue Debridement with a total area of 113.4 sq cm performed by Kalman Shan, MD. With the following instrument(s): Curette to remove Viable and Non-Viable tissue/material. Material removed includes Subcutaneous Tissue and Slough and. No specimens were taken. A time out was conducted at 09:15, prior to the start of the procedure. A Minimum amount of bleeding was controlled with Pressure.  The procedure was tolerated well. Post Debridement Measurements: 10.5cm length x 10.6cm width x 0.1cm depth; 8.741cm^3 volume. Character of Wound/Ulcer Post Debridement is stable. Severity of Tissue Post Debridement is: Fat layer exposed. Post procedure Diagnosis Wound #1: Same as Pre-Procedure NIKELLE, MALATESTA ([id number]) Plan Follow-up Appointments: Return Appointment in 2 weeks. Nurse Visit as needed Home Health: [place]: - [place] for wound care. May utilize formulary equivalent dressing for wound treatment orders unless otherwise specified. Home Health Nurse may visit PRN to address patient s wound care needs. **Please direct any NON-WOUND related issues/requests for orders to patient's Primary Care Physician. **If current dressing causes regression in wound condition, may D/C ordered dressing product/s and apply Normal Saline Moist Dressing daily until next [place] or Other MD appointment. **Notify [facility] of regression in wound condition at [phone]. Other Home Health Orders/Instructions: - Dressing change 3 x weekly, once by wound care and once at wound clinic weekly Bathing/ Shower/ Hygiene: May shower with wound dressing protected with water repellent cover or cast protector. No tub  bath. Anesthetic (Use 'Patient Medications' Section for Anesthetic Order Entry): Lidocaine applied to wound bed Edema Control - Lymphedema / Segmental Compressive Device / Other: Optional: One layer of unna paste to top of compression wrap (to act as an anchor). Elevate leg(s) parallel to the floor when sitting. DO YOUR BEST to sleep in the bed at night. DO NOT sleep in your recliner. Long hours of sitting in a recliner leads to swelling of the legs and/or potential wounds on your backside. Additional Orders / Instructions: Follow Nutritious Diet and Increase Protein Intake WOUND #1: - Lower Leg Wound Laterality: Left, Medial Topical: Gentamicin 3 x Per Week/30 Days Discharge Instructions: Apply as directed by provider. Apply to wound bed with ketoconazole Topical: Triamcinolone Acetonide Cream, 0.1%, 15 (g) tube 3 x Per Week/30 Days Discharge Instructions: Apply as directed by provider. Apply to leg Topical: Ketoconazole Cream 2%, 30 (g), tube 3 x Per Week/30 Days Discharge Instructions: apply 1:1 ratio with gentamicin, apply to wound bed. Nystatin in office Primary Dressing: Cutimed Sorbact 1.5x 2.38 (in/in) 3 x Per Week/30 Days Discharge Instructions: A bacteria- and fungi binding wound dressing, suitable for cavities and fistulas. It is suitable as a wound filler and allows the passage of wound exudate into a secondary dressing. The dressing helps reducing odor and pain and can improve healing. Secondary Dressing: Zetuvit Plus Silicone Non-bordered 5x5 (in/in) 3 x Per Week/30 Days Compression Wrap: Profore Lite LF 3 Multilayer Compression Bandaging System 3 x Per Week/30 Days Discharge Instructions: Apply 3 multi-layer wrap as prescribed. 1. In office sharp debridement 2. Gentamicin, ketoconazole and Sorbact under 3 layer compression 3. Follow-up in 1 week Electronic Signature(s) Signed: 02/01/2022 9:25:24 AM By: Kalman Shan DO Entered By: Kalman Shan on 02/01/2022  09:23:32 MAKAYLI, BRACKEN ([id number]) -------------------------------------------------------------------------------- SuperBill Details Patient Name: Laura Santiago. Date of Service: 02/01/2022 Medical Record Number: [id number] Patient Account Number: [id number] Date of Birth/Sex: June 08, 1946 (76 y.o. 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 S91.201D Unspecified open wound of right great toe with damage to nail, subsequent encounter I87.2 Venous insufficiency (chronic) (peripheral) Z79.01 Long term (current) use of  anticoagulants I10 Essential (primary) hypertension 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 Facility Procedures CPT4 Code: 12751700 Description: 17494 - DEB SUBQ TISSUE 20 SQ CM/< Modifier: Quantity: 1 CPT4 Code: Description: ICD-10 Diagnosis Description L97.822 Non-pressure chronic ulcer of other part of left lower leg with fat layer Modifier: exposed Quantity: CPT4 Code: 49675916 Description: 38466 - DEB SUBQ TISS EA ADDL 20CM Modifier: Quantity: 5 CPT4 Code: Description: ICD-10 Diagnosis Description L97.822 Non-pressure chronic ulcer of other part of left lower leg with fat layer Modifier: exposed Quantity: Physician Procedures CPT4 Code: 5993570 Description: 17793 - WC PHYS SUBQ TISS 20 SQ CM Modifier: Quantity: 1 CPT4 Code: Description: ICD-10 Diagnosis Description L97.822 Non-pressure chronic ulcer of other part of left lower leg with fat layer Modifier: exposed Quantity: CPT4 Code: 9030092 Description: 33007 - WC PHYS SUBQ TISS EA ADDL 20 CM Modifier: Quantity:  5 CPT4 Code: Description: ICD-10 Diagnosis Description L97.822 Non-pressure chronic ulcer of other part of left lower leg with fat layer Modifier: exposed Quantity: Electronic Signature(s) Signed: 02/01/2022 10:23:05 AM By: Kalman Shan DO Signed: 02/01/2022 4:28:37 PM By: Levora Dredge Previous Signature: 02/01/2022 9:25:24 AM Version By: Kalman Shan DO Entered By: Levora Dredge on 02/01/2022 09:38:41

## 2022-02-01 NOTE — Telephone Encounter (Signed)
Called yest. And did not get an answer and today taylor tried and no answer and can't leave a message. Dr. Janese Banks wants her to start back on blood thinner the xarelto. I will send a message to her daughter. Called [name] her daughter and

## 2022-02-02 ENCOUNTER — Telehealth: Payer: Self-pay | Admitting: *Deleted

## 2022-02-02 ENCOUNTER — Telehealth (INDEPENDENT_AMBULATORY_CARE_PROVIDER_SITE_OTHER): Payer: Self-pay

## 2022-02-02 NOTE — Telephone Encounter (Signed)
I called and spoke to Laura Santiago at vascular office. I wanted to let Laura Santiago know that pt. Has hard time using her phone and I spoke to her daughter [name] and she  says to call her with the information and she will make arrangements for pt to get her where she needs to go and get new port.

## 2022-02-02 NOTE — Telephone Encounter (Signed)
I attempted to contact the daughter to schedule the patient for a port insertion and a message was left for a return call.

## 2022-02-02 NOTE — Telephone Encounter (Signed)
Patient's daughter  called back and the patient is scheduled on 02/13/22 with a 8:00 am arrival to the [facility]. Pre-procedure instructions were discussed and will be mailed.

## 2022-02-03 ENCOUNTER — Other Ambulatory Visit: Payer: Self-pay

## 2022-02-03 ENCOUNTER — Ambulatory Visit
Admission: RE | Admit: 2022-02-03 | Discharge: 2022-02-03 | Disposition: A | Discharge status: Home / Self Care | Payer: Medicare Other | Source: Ambulatory Visit | Attending: Oncology | Admitting: Oncology

## 2022-02-03 DIAGNOSIS — Z79899 Other long term (current) drug therapy: Secondary | ICD-10-CM | POA: Insufficient documentation

## 2022-02-03 DIAGNOSIS — Z791 Long term (current) use of non-steroidal anti-inflammatories (NSAID): Secondary | ICD-10-CM | POA: Insufficient documentation

## 2022-02-03 DIAGNOSIS — Z5181 Encounter for therapeutic drug level monitoring: Secondary | ICD-10-CM | POA: Insufficient documentation

## 2022-02-03 DIAGNOSIS — Z0189 Encounter for other specified special examinations: Secondary | ICD-10-CM | POA: Diagnosis not present

## 2022-02-03 DIAGNOSIS — I10 Essential (primary) hypertension: Secondary | ICD-10-CM | POA: Insufficient documentation

## 2022-02-03 DIAGNOSIS — C50919 Malignant neoplasm of unspecified site of unspecified female breast: Secondary | ICD-10-CM | POA: Insufficient documentation

## 2022-02-03 LAB — ECHOCARDIOGRAM COMPLETE
AR max vel: 2.24 cm2
AV Area VTI: 2.42 cm2
AV Area mean vel: 2.22 cm2
AV Mean grad: 5 mmHg
AV Peak grad: 11.3 mmHg
Ao pk vel: 1.68 m/s
Area-P 1/2: 3.76 cm2
MV VTI: 2.32 cm2
S' Lateral: 2.57 cm

## 2022-02-03 NOTE — Progress Notes (Signed)
*  PRELIMINARY RESULTS* Echocardiogram 2D Echocardiogram has been performed.  Sherrie Sport 02/03/2022, 11:38 AM

## 2022-02-06 ENCOUNTER — Other Ambulatory Visit: Payer: Self-pay | Admitting: *Deleted

## 2022-02-06 ENCOUNTER — Telehealth: Payer: Self-pay

## 2022-02-06 MED ORDER — OXYCODONE HCL 10 MG PO TABS: 10.0000 mg | ORAL_TABLET | ORAL | 0 refills | Status: DC | PRN

## 2022-02-06 NOTE — Telephone Encounter (Signed)
I spoke with patient daughter in regards to her porta cath insertion appointment. Patient daughter stated that they in fact did call her to tell her that she has an appointment on 02/13/2022 @9am .

## 2022-02-08 ENCOUNTER — Other Ambulatory Visit: Payer: Self-pay

## 2022-02-08 ENCOUNTER — Encounter: Payer: Medicare Other | Attending: Internal Medicine | Admitting: Internal Medicine

## 2022-02-08 DIAGNOSIS — F909 Attention-deficit hyperactivity disorder, unspecified type: Secondary | ICD-10-CM | POA: Insufficient documentation

## 2022-02-08 DIAGNOSIS — C79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: Secondary | ICD-10-CM | POA: Diagnosis not present

## 2022-02-08 DIAGNOSIS — Z86718 Personal history of other venous thrombosis and embolism: Secondary | ICD-10-CM | POA: Insufficient documentation

## 2022-02-08 DIAGNOSIS — Z7901 Long term (current) use of anticoagulants: Secondary | ICD-10-CM | POA: Diagnosis not present

## 2022-02-08 DIAGNOSIS — N184 Chronic kidney disease, stage 4 (severe): Secondary | ICD-10-CM | POA: Insufficient documentation

## 2022-02-08 DIAGNOSIS — L97822 Non-pressure chronic ulcer of other part of left lower leg with fat layer exposed: Secondary | ICD-10-CM | POA: Diagnosis not present

## 2022-02-08 DIAGNOSIS — I87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: Secondary | ICD-10-CM | POA: Diagnosis not present

## 2022-02-08 DIAGNOSIS — X58XXXA Exposure to other specified factors, initial encounter: Secondary | ICD-10-CM | POA: Diagnosis not present

## 2022-02-08 DIAGNOSIS — S91101A Unspecified open wound of right great toe without damage to nail, initial encounter: Secondary | ICD-10-CM | POA: Diagnosis not present

## 2022-02-08 DIAGNOSIS — I129 Hypertensive chronic kidney disease with stage 1 through stage 4 chronic kidney disease, or unspecified chronic kidney disease: Secondary | ICD-10-CM | POA: Diagnosis not present

## 2022-02-08 DIAGNOSIS — I872 Venous insufficiency (chronic) (peripheral): Secondary | ICD-10-CM | POA: Insufficient documentation

## 2022-02-08 NOTE — Progress Notes (Signed)
KYMARI, LOLLIS ([id number]) Visit Report for 02/08/2022 Arrival Information Details Patient Name: Laura Santiago, Laura Santiago. Date of Service: 02/08/2022 9:00 AM Medical Record Number: [id number] Patient Account Number: [id number] Date of Birth/Sex: [birth year] (76 y.o. F) Treating RN: Levora Dredge Primary Care Caroleen Stoermer: Orvis Brill, DOCTORS Other Clinician: Referring Meher Kucinski: HOUSECALLS, DOCTORS Treating Harinder Romas/Extender: Yaakov Guthrie in Treatment: 74 Visit Information History Since Last Visit Added or deleted any medications: No Patient Arrived: Walker Any new allergies or adverse reactions: No Arrival Time: 08:58 Had a fall or experienced change in No Accompanied By: self activities of daily living that may affect Transfer Assistance: None risk of falls: Patient Identification Verified: Yes Hospitalized since last visit: No Secondary Verification Process Completed: Yes Has Dressing in Place as Prescribed: Yes Patient Has Alerts: Yes Has Compression in Place as Prescribed: Yes Patient Alerts: PT HAS SERVICE ANIMAL Pain Present Now: No ABI 07/11/21 R) 1.16 L) 1.27 Electronic Signature(s) Signed: 02/08/2022 3:02:55 PM By: Levora Dredge Entered By: Levora Dredge on 02/08/2022 09:07:41 Laura Santiago ([id number]) -------------------------------------------------------------------------------- Clinic Level of Care Assessment Details Patient Name: Laura Santiago. Date of Service: 02/08/2022 9:00 AM Medical Record Number: [id number] Patient Account Number: [id number] Date of Birth/Sex: August 09, 1946 (76 y.o. F) Treating RN: Levora Dredge Primary Care Zavior Thomason: Orvis Brill, DOCTORS Other Clinician: Referring Jonne Rote: Orvis Brill, DOCTORS Treating Aunesty Tyson/Extender: Yaakov Guthrie in Treatment: 31 Clinic Level of Care Assessment Items TOOL 1 Quantity Score []  - Use when EandM and Procedure is performed on INITIAL visit 0 ASSESSMENTS - Nursing Assessment /  Reassessment []  - General Physical Exam (combine w/ comprehensive assessment (listed just below) when performed on new 0 pt. evals) []  - 0 Comprehensive Assessment (HX, ROS, Risk Assessments, Wounds Hx, etc.) ASSESSMENTS - Wound and Skin Assessment / Reassessment []  - Dermatologic / Skin Assessment (not related to wound area) 0 ASSESSMENTS - Ostomy and/or Continence Assessment and Care []  - Incontinence Assessment and Management 0 []  - 0 Ostomy Care Assessment and Management (repouching, etc.) PROCESS - Coordination of Care []  - Simple Patient / Family Education for ongoing care 0 []  - 0 Complex (extensive) Patient / Family Education for ongoing care []  - 0 Staff obtains Programmer, systems, Records, Test Results / Process Orders []  - 0 Staff telephones HHA, Nursing Homes / Clarify orders / etc []  - 0 Routine Transfer to another Facility (non-emergent condition) []  - 0 Routine Hospital Admission (non-emergent condition) []  - 0 New Admissions / Biomedical engineer / Ordering NPWT, Apligraf, etc. []  - 0 Emergency Hospital Admission (emergent condition) PROCESS - Special Needs []  - Pediatric / Minor Patient Management 0 []  - 0 Isolation Patient Management []  - 0 Hearing / Language / Visual special needs []  - 0 Assessment of Community assistance (transportation, D/C planning, etc.) []  - 0 Additional assistance / Altered mentation []  - 0 Support Surface(s) Assessment (bed, cushion, seat, etc.) INTERVENTIONS - Miscellaneous []  - External ear exam 0 []  - 0 Patient Transfer (multiple staff / Civil Service fast streamer / Similar devices) []  - 0 Simple Staple / Suture removal (25 or less) []  - 0 Complex Staple / Suture removal (26 or more) []  - 0 Hypo/Hyperglycemic Management (do not check if billed separately) []  - 0 Ankle / Brachial Index (ABI) - do not check if billed separately Has the patient been seen at the hospital within the last three years: Yes Total Score: 0 Level Of Care:  ____ Laura Santiago ([id number]) Electronic Signature(s) Signed: 02/08/2022 3:02:55 PM By: Levora Dredge Entered By: Levora Dredge on 02/08/2022 09:46:11  Laura Santiago, Laura Santiago ([id number]) -------------------------------------------------------------------------------- Encounter Discharge Information Details Patient Name: Laura Santiago, Laura Santiago. Date of Service: 02/08/2022 9:00 AM Medical Record Number: [id number] Patient Account Number: [id number] Date of Birth/Sex: 01-02-1946 (76 y.o. F) Treating RN: Levora Dredge Primary Care Clarissia Mckeen: Orvis Brill, DOCTORS Other Clinician: Referring Shailene Demonbreun: Orvis Brill, DOCTORS Treating Trenda Corliss/Extender: Yaakov Guthrie in Treatment: 31 Encounter Discharge Information Items Post Procedure Vitals Discharge Condition: Stable Temperature (F): 98.3 Ambulatory Status: Walker Pulse (bpm): 91 Discharge Destination: Other (Note Required) Respiratory Rate (breaths/min): 18 Telephoned: No Blood Pressure (mmHg): 135/70 Orders Sent: Yes Transportation: Other Accompanied By: self Schedule Follow-up Appointment: Yes Clinical Summary of Care: Electronic Signature(s) Signed: 02/08/2022 10:10:30 AM By: Levora Dredge Entered By: Levora Dredge on 02/08/2022 10:10:30 Laura Santiago ([id number]) -------------------------------------------------------------------------------- Lower Extremity Assessment Details Patient Name: Laura Santiago. Date of Service: 02/08/2022 9:00 AM Medical Record Number: [id number] Patient Account Number: [id number] Date of Birth/Sex: 25-Dec-1945 (76 y.o. F) Treating RN: Levora Dredge Primary Care Janeal Abadi: Orvis Brill, DOCTORS Other Clinician: Referring Teressa Mcglocklin: HOUSECALLS, DOCTORS Treating Kerrion Kemppainen/Extender: Yaakov Guthrie in Treatment: 31 Edema Assessment Assessed: [Left: No] [Right: No] Edema: [Left: Ye] [Right: s] Calf Left: Right: Point of Measurement: 29 cm From Medial Instep 32.3 cm Ankle Left:  Right: Point of Measurement: 10 cm From Medial Instep 22 cm Vascular Assessment Pulses: Dorsalis Pedis Palpable: [Left:Yes] Electronic Signature(s) Signed: 02/08/2022 3:02:55 PM By: Levora Dredge Entered By: Levora Dredge on 02/08/2022 09:18:43 Laura Santiago ([id number]) -------------------------------------------------------------------------------- Multi Wound Chart Details Patient Name: Laura Santiago. Date of Service: 02/08/2022 9:00 AM Medical Record Number: [id number] Patient Account Number: [id number] Date of Birth/Sex: May 25, 1946 (77 y.o. F) Treating RN: Levora Dredge Primary Care Issis Lindseth: Orvis Brill, DOCTORS Other Clinician: Referring Yigit Norkus: HOUSECALLS, DOCTORS Treating Likisha Alles/Extender: Yaakov Guthrie in Treatment: 31 Vital Signs Height(in): 66 Pulse(bpm): 91 Weight(lbs): 153 Blood Pressure(mmHg): 135/70 Body Mass Index(BMI): 24.7 Temperature(F): 98.3 Respiratory Rate(breaths/min): 18 Photos: [N/A:N/A] Wound Location: Left, Medial Lower Leg N/A N/A Wounding Event: Gradually Appeared N/A N/A Primary Etiology: Venous Leg Ulcer N/A N/A Comorbid History: Hypertension, Osteoarthritis, N/A N/A Received Chemotherapy, Received Radiation Date Acquired: 04/06/2021 N/A N/A Weeks of Treatment: 31 N/A N/A Wound Status: Open N/A N/A Wound Recurrence: No N/A N/A Clustered Wound: Yes N/A N/A Measurements L x W x D (cm) 9.5x10.5x0.1 N/A N/A Area (cm) : 78.343 N/A N/A Volume (cm) : 7.834 N/A N/A % Reduction in Area: 13.30% N/A N/A % Reduction in Volume: 56.60% N/A N/A Classification: Full Thickness Without Exposed N/A N/A Support Structures Exudate Amount: Large N/A N/A Exudate Type: Serosanguineous N/A N/A Exudate Color: red, brown N/A N/A Wound Margin: Flat and Intact N/A N/A Granulation Amount: Medium (34-66%) N/A N/A Granulation Quality: Red, Pink N/A N/A Necrotic Amount: Medium (34-66%) N/A N/A Exposed Structures: Fat Layer (Subcutaneous  Tissue): N/A N/A Yes Fascia: No Tendon: No Muscle: No Joint: No Bone: No Laura Santiago, Laura Santiago ([id number]) Epithelialization: Small (1-33%) N/A N/A Treatment Notes Electronic Signature(s) Signed: 02/08/2022 3:02:55 PM By: Levora Dredge Entered By: Levora Dredge on 02/08/2022 09:21:49 Laura Santiago ([id number]) -------------------------------------------------------------------------------- Multi-Disciplinary Care Plan Details Patient Name: Laura Santiago. Date of Service: 02/08/2022 9:00 AM Medical Record Number: [id number] Patient Account Number: [id number] Date of Birth/Sex: 08-16-1946 (76 y.o. F) Treating RN: Levora Dredge Primary Care Ignace Mandigo: Orvis Brill, DOCTORS Other Clinician: Referring Thurmon Mizell: Orvis Brill, DOCTORS Treating Adelheid Hoggard/Extender: Yaakov Guthrie in Treatment: 31 Active Inactive Wound/Skin Impairment Nursing Diagnoses: Impaired tissue integrity Goals: Patient/caregiver will verbalize understanding of skin care regimen Date Initiated: 07/06/2021 Date Inactivated: 07/27/2021 Target Resolution Date: 07/06/2021  Goal Status: Met Ulcer/skin breakdown will have a volume reduction of 30% by week 4 Date Initiated: 07/06/2021 Date Inactivated: 10/12/2021 Target Resolution Date: 08/06/2021 Goal Status: Unmet Unmet Reason: cont tx Ulcer/skin breakdown will have a volume reduction of 50% by week 8 Date Initiated: 07/06/2021 Target Resolution Date: 09/06/2021 Goal Status: Active Ulcer/skin breakdown will have a volume reduction of 80% by week 12 Date Initiated: 07/06/2021 Target Resolution Date: 10/06/2021 Goal Status: Active Ulcer/skin breakdown will heal within 14 weeks Date Initiated: 07/06/2021 Target Resolution Date: 11/06/2021 Goal Status: Active Interventions: Assess patient/caregiver ability to obtain necessary supplies Assess patient/caregiver ability to perform ulcer/skin care regimen upon admission and as needed Assess ulceration(s) every  visit Treatment Activities: Referred to DME Tequan Redmon for dressing supplies : 07/06/2021 Skin care regimen initiated : 07/06/2021 Notes: Electronic Signature(s) Signed: 02/08/2022 3:02:55 PM By: Levora Dredge Entered By: Levora Dredge on 02/08/2022 09:21:34 Laura Santiago ([id number]) -------------------------------------------------------------------------------- Pain Assessment Details Patient Name: Laura Santiago. Date of Service: 02/08/2022 9:00 AM Medical Record Number: [id number] Patient Account Number: [id number] Date of Birth/Sex: 1946/11/26 (76 y.o. F) Treating RN: Levora Dredge Primary Care Aronda Burford: Orvis Brill, DOCTORS Other Clinician: Referring Jackline Castilla: Orvis Brill, DOCTORS Treating Porsche Noguchi/Extender: Yaakov Guthrie in Treatment: 31 Active Problems Location of Pain Severity and Description of Pain Patient Has Paino No Site Locations Rate the pain. Current Pain Level: 0 Pain Management and Medication Current Pain Management: Electronic Signature(s) Signed: 02/08/2022 3:02:55 PM By: Levora Dredge Entered By: Levora Dredge on 02/08/2022 09:09:01 Laura Santiago ([id number]) -------------------------------------------------------------------------------- Patient/Caregiver Education Details Patient Name: Laura Santiago. Date of Service: 02/08/2022 9:00 AM Medical Record Number: [id number] Patient Account Number: [id number] Date of Birth/Gender: Feb 03, 1946 (76 y.o. F) Treating RN: Levora Dredge Primary Care Physician: Orvis Brill, DOCTORS Other Clinician: Referring Physician: Orvis Brill, DOCTORS Treating Physician/Extender: Yaakov Guthrie in Treatment: 43 Education Assessment Education Provided To: Patient Education Topics Provided Wound/Skin Impairment: Handouts: Caring for Your Ulcer Methods: Explain/Verbal Responses: State content correctly Electronic Signature(s) Signed: 02/08/2022 3:02:55 PM By: Levora Dredge Entered By: Levora Dredge on 02/08/2022 09:46:39 Laura Santiago ([id number]) -------------------------------------------------------------------------------- Wound Assessment Details Patient Name: Laura Santiago. Date of Service: 02/08/2022 9:00 AM Medical Record Number: [id number] Patient Account Number: [id number] Date of Birth/Sex: [birth year] (76 y.o. F) Treating RN: Levora Dredge Primary Care Lanaysia Fritchman: Orvis Brill, DOCTORS Other Clinician: Referring Maxi Carreras: HOUSECALLS, DOCTORS Treating Tiras Bianchini/Extender: Yaakov Guthrie in Treatment: 31 Wound Status Wound Number: 1 Primary Venous Leg Ulcer Etiology: Wound Location: Left, Medial Lower Leg Wound Status: Open Wounding Event: Gradually Appeared Comorbid Hypertension, Osteoarthritis, Received Chemotherapy, Date Acquired: 04/06/2021 History: Received Radiation Weeks Of Treatment: 31 Clustered Wound: Yes Photos Wound Measurements Length: (cm) 9.5 Width: (cm) 10.5 Depth: (cm) 0.1 Area: (cm) 78.343 Volume: (cm) 7.834 % Reduction in Area: 13.3% % Reduction in Volume: 56.6% Epithelialization: Small (1-33%) Tunneling: No Undermining: No Wound Description Classification: Full Thickness Without Exposed Support Structu Wound Margin: Flat and Intact Exudate Amount: Large Exudate Type: Serosanguineous Exudate Color: red, brown res Foul Odor After Cleansing: No Slough/Fibrino Yes Wound Bed Granulation Amount: Medium (34-66%) Exposed Structure Granulation Quality: Red, Pink Fascia Exposed: No Necrotic Amount: Medium (34-66%) Fat Layer (Subcutaneous Tissue) Exposed: Yes Necrotic Quality: Adherent Slough Tendon Exposed: No Muscle Exposed: No Joint Exposed: No Bone Exposed: No Treatment Notes Wound #1 (Lower Leg) Wound Laterality: Left, Medial Cleanser Peri-Wound Care Topical Gentamicin Laura Santiago, Laura Santiago ([id number]) Discharge Instruction: Apply as directed by Asahel Risden. Apply to wound bed with ketoconazole Triamcinolone  Acetonide Cream, 0.1%, 15 (g) tube Discharge Instruction:  Apply as directed by Sevyn Paredez. Apply to leg. for excoriated areas, mixed with desitin and apply Ketoconazole Cream 2%, 30 (g), tube Discharge Instruction: apply 1:1 ratio with gentamicin, apply to wound bed. Nystatin in office Desitin Maximum Strength Ointment, 1 (oz) tube Discharge Instruction: mixed with TCA and applied to excoriated areas Primary Dressing Cutimed Sorbact 1.5x 2.38 (in/in) Discharge Instruction: A bacteria- and fungi binding wound dressing, suitable for cavities and fistulas. It is suitable as a wound filler and allows the passage of wound exudate into a secondary dressing. The dressing helps reducing odor and pain and can improve healing. Secondary Dressing Zetuvit Plus Silicone Non-bordered 5x5 (in/in) Secured With Compression Wrap Profore Lite LF 3 Multilayer Compression Bandaging System Discharge Instruction: Apply 3 multi-layer wrap as prescribed. Compression Stockings Add-Ons Electronic Signature(s) Signed: 02/08/2022 3:02:55 PM By: Levora Dredge Entered By: Levora Dredge on 02/08/2022 09:18:14 Laura Santiago, Laura Santiago ([id number]) -------------------------------------------------------------------------------- Vitals Details Patient Name: Laura Santiago. Date of Service: 02/08/2022 9:00 AM Medical Record Number: [id number] Patient Account Number: [id number] Date of Birth/Sex: [birth year] (76 y.o. F) Treating RN: Levora Dredge Primary Care Prestin Munch: Orvis Brill, DOCTORS Other Clinician: Referring Landree Fernholz: HOUSECALLS, DOCTORS Treating Jasier Calabretta/Extender: Yaakov Guthrie in Treatment: 31 Vital Signs Time Taken: 09:08 Temperature (F): 98.3 Height (in): 66 Pulse (bpm): 91 Weight (lbs): 153 Respiratory Rate (breaths/min): 18 Body Mass Index (BMI): 24.7 Blood Pressure (mmHg): 135/70 Reference Range: 80 - 120 mg / dl Electronic Signature(s) Signed: 02/08/2022 3:02:55 PM By: Levora Dredge Entered By: Levora Dredge on 02/08/2022 09:08:32

## 2022-02-08 NOTE — Progress Notes (Signed)
Laura Santiago, Laura Santiago ([id number]) Visit Report for 02/08/2022 Chief Complaint Document Details Patient Name: Laura Santiago, Laura Santiago. Date of Service: 02/08/2022 9:00 AM Medical Record Number: [id number] Patient Account Number: [id number] Date of Birth/Sex: 19-Mar-1946 (76 y.o. F) Treating RN: Levora Dredge Primary Care Provider: Orvis Brill, DOCTORS Other Clinician: Referring Provider: Orvis Brill, DOCTORS Treating Provider/Extender: Yaakov Guthrie in Treatment: 31 Information Obtained from: Patient Chief Complaint Left lower extremity wound Right toe wounds Left upper lateral thigh wounds Electronic Signature(s) Signed: 02/08/2022 9:34:39 AM By: Kalman Shan DO Entered By: Kalman Shan on 02/08/2022 09:30:29 Laura Santiago ([id number]) -------------------------------------------------------------------------------- Debridement Details Patient Name: Laura Santiago. Date of Service: 02/08/2022 9:00 AM Medical Record Number: [id number] Patient Account Number: [id number] Date of Birth/Sex: Apr 27, 1946 (76 y.o. F) Treating RN: Levora Dredge Primary Care Provider: Orvis Brill, DOCTORS Other Clinician: Referring Provider: Orvis Brill, DOCTORS Treating Provider/Extender: Yaakov Guthrie in Treatment: 31 Debridement Performed for Wound #1 Left,Medial Lower Leg Assessment: Performed By: Physician Kalman Shan, MD Debridement Type: Debridement Severity of Tissue Pre Debridement: Fat layer exposed Level of Consciousness (Pre- Awake and Alert procedure): Pre-procedure Verification/Time Out Yes - 09:22 Taken: Total Area Debrided (L x W): 9.5 (cm) x 10.5 (cm) = 99.75 (cm) Tissue and other material Slough, Subcutaneous, Slough debrided: Level: Skin/Subcutaneous Tissue Debridement Description: Excisional Instrument: Curette Bleeding: Minimum Hemostasis Achieved: Pressure Response to Treatment: Procedure was tolerated well Level of Consciousness (Post- Awake and  Alert procedure): Post Debridement Measurements of Total Wound Length: (cm) 9.5 Width: (cm) 10.5 Depth: (cm) 0.1 Volume: (cm) 7.834 Character of Wound/Ulcer Post Debridement: Stable Severity of Tissue Post Debridement: Fat layer exposed Post Procedure Diagnosis Same as Pre-procedure Electronic Signature(s) Signed: 02/08/2022 9:34:39 AM By: Kalman Shan DO Signed: 02/08/2022 3:02:55 PM By: Levora Dredge Entered By: Levora Dredge on 02/08/2022 09:25:47 Laura Santiago ([id number]) -------------------------------------------------------------------------------- HPI Details Patient Name: Laura Santiago. Date of Service: 02/08/2022 9:00 AM Medical Record Number: [id number] Patient Account Number: [id number] Date of Birth/Sex: 08/24/1946 (76 y.o. F) Treating RN: Levora Dredge Primary Care Provider: Priscille Kluver Other Clinician: Referring Provider: Orvis Brill, DOCTORS Treating Provider/Extender: Yaakov Guthrie in Treatment: 31 History of Present Illness HPI Description: Admission 7/27 Laura Santiago is a 76 year old female with a past medical history of ADHD, metastatic breast cancer, stage IV chronic kidney disease, history of DVT on Xarelto and chronic venous insufficiency that presents to the clinic for a chronic left lower extremity wound. She recently moved to [facility] 4 days ago. She was being followed by wound care center in [name]. She reports a 10-year history of wounds to her left lower extremity that eventually do heal with debridement and compression therapy. She states that the current wound reopened 4 months ago and she is using Vaseline and Coban. She denies signs of infection. 8/3; patient presents for 1 week follow-up. She reports no issues or complaints today. She states she had vascular studies done in the last week. She denies signs of infection. She brought her little service dog with her today. 8/17; patient presents for  follow-up. She has missed her last clinic appointment. She states she took the wrap off and attempted to rewrap her leg. She is having difficulty with transportation. She has her service dog with her today. Overall she feels well and reports improvement in wound healing. She denies signs of infection. She reports owning an old Velcro wrap compression and has this at her living facility 9/14; patient presents for follow-up. Patient states that over the past 2 to 3 weeks  she developed toe wounds to her right foot. She attributes this to tight fitting shoes. She subsequently developed cellulitis in the right leg and has been treated by doxycycline by her oncologist. She reports improvement in symptoms however continues to have some redness and swelling to this leg. To the left lower extremity patient has been having her wraps changed with home health twice weekly. She states that the [facility] is not helping control the drainage. Other than that she has no issues or complaints today. She denies signs of infection to the left lower extremity. 9/21; patient presents for follow-up. She reports seeing infectious disease for her cellulitis. She reports no further management. She has home health that changes the wraps twice weekly. She has no issues or complaints today. She denies signs of infection. 10/5; patient presents for follow-up. She has no issues or complaints today. She denies signs of infection. She states that the right great toe has not been dressed by home health. 10/12; patient presents for follow-up. She has no issues or complaints today. She reports improvement in her wound healing. She has been using silver alginate to the right great toe wound. She denies signs of infection. 10/26; patient presents for follow-up. Home health did not have sorbact so they continued to use Hydrofera Blue under the wrap. She has been using silver alginate to the great toe wound however she did not have a  dressing in place today. She currently denies signs of infection. 11/2; patient presents for follow-up. She has been using sorb act under the compression wrap. She reports using silver alginate to the toe wound again she does not have a dressing in place. She currently denies signs of infection. 11/23; patient presents for follow-up. Unfortunately she has missed her last 2 clinic appointments. She was last seen 3 weeks ago. She did her own compression wrap with Kerlix and Coban yesterday after seeing vein and vascular. She has not been dressing her right great toe wound. She currently denies signs of infection. 11/30; patient presents for 1 week follow-up. She states she changed her dressing last week prior to home health and use sorb act with Dakin's and Hydrofera Blue. Home health has changed the dressing as well and they have been using sorbact. Today she reports increased redness to her right lower extremity. She has a history of cellulitis to this leg. She has been using silver alginate to the right great toe. Unfortunately she had an episode of diarrhea prior to coming in and had feces all over the right leg and to the wrap of her left leg. 12/7; patient presents for 1 week follow-up. She states that home health did not come out to change the dressing and she took it off yesterday. It is unclear if she is dressing the right toe wound. She denies signs of infection. 12/14; patient presents for 1 week follow-up. She has no issues or complaints today. 12/21; patient presents for follow-up. She has no issues or complaints today. She denies signs of infection. 12/28/2021; patient presents for follow-up. She was hospitalized for sepsis secondary to right lower extremity cellulitis On 12/23. She states she is currently at a SNF. She states that she was started on doxycycline this morning for her right great toe swelling and redness. She is not sure what dressings have been done to her left lower  extremity for the past 3 weeks. She says its been mainly gauze with an Ace wrap. 1/25; patient presents for follow-up. She is still residing  in a skilled nursing facility. She reports mild pain to the left lower extremity wound bed. She states she is going to see a podiatrist soon. 2/8; patient presents for follow-up. She has moved back to her residential community from her skilled nursing facility. She has no issues or complaints today. She denies signs of systemic infections. 2/15; patient presents for follow-up. He has no issues or complaints today. She denies systemic signs of infection. 2/22; patient presents for follow-up. She has no issues or complaints today. She denies signs of infection. KALEB, LINQUIST ([id number]) 3/1; patient presents for follow-up. She states that home health came out the day after she was seen in our clinic and yesterday to do the wrap change. She denies signs of infection. She reports excoriated skin on the ankle. Electronic Signature(s) Signed: 02/08/2022 9:34:39 AM By: Kalman Shan DO Entered By: Kalman Shan on 02/08/2022 09:31:24 Laura Santiago ([id number]) -------------------------------------------------------------------------------- Physical Exam Details Patient Name: Laura Santiago, Laura Santiago. Date of Service: 02/08/2022 9:00 AM Medical Record Number: [id number] Patient Account Number: [id number] Date of Birth/Sex: October 11, 1946 (76 y.o. F) Treating RN: Levora Dredge Primary Care Provider: Orvis Brill, DOCTORS Other Clinician: Referring Provider: HOUSECALLS, DOCTORS Treating Provider/Extender: Yaakov Guthrie in Treatment: 31 Constitutional . Cardiovascular . Psychiatric . Notes Left lower extremity: Large open wound to the medial aspect with Granulation tissue and nonviable tissue. No surrounding signs of infection. Skin breakdown to the foot and ankle from excess drainage. Electronic Signature(s) Signed: 02/08/2022 9:34:39 AM By:  Kalman Shan DO Entered By: Kalman Shan on 02/08/2022 09:31:51 Laura Santiago ([id number]) -------------------------------------------------------------------------------- Physician Orders Details Patient Name: Laura Santiago. Date of Service: 02/08/2022 9:00 AM Medical Record Number: [id number] Patient Account Number: [id number] Date of Birth/Sex: Jun 03, 1946 (76 y.o. F) Treating RN: Levora Dredge Primary Care Provider: Orvis Brill, DOCTORS Other Clinician: Referring Provider: Orvis Brill, DOCTORS Treating Provider/Extender: Yaakov Guthrie in Treatment: 78 Verbal / Phone Orders: No Diagnosis Coding Follow-up Appointments o Return Appointment in 1 week. o Nurse Visit as needed [facility] for wound care. May utilize formulary equivalent dressing for wound treatment orders unless otherwise specified. Home Health Nurse may visit PRN to address patientos wound care needs. o **Please direct any NON-WOUND related issues/requests for orders to patient's Primary Care Physician. **If current dressing causes regression in wound condition, may D/C ordered dressing product/s and apply Normal Saline Moist Dressing daily until next [place] or Other MD appointment. **Notify Wound Healing Center of regression in wound condition at [phone]. o Other Home Health Orders/Instructions: - Dressing change 3 x weekly, once by wound care and once at wound clinic weekly. PLEASE make sure frequency between dressing changes is appropriate. Bathing/ Shower/ Hygiene o May shower with wound dressing protected with water repellent cover or cast protector. o No tub bath. Anesthetic (Use 'Patient Medications' Section for Anesthetic Order Entry) o Lidocaine applied to wound bed Edema Control - Lymphedema / Segmental Compressive Device / Other o Optional: One layer of unna paste to top of compression wrap  (to act as an anchor). o Elevate leg(s) parallel to the floor when sitting. o DO YOUR BEST to sleep in the bed at night. DO NOT sleep in your recliner. Long hours of sitting in a recliner leads to swelling of the legs and/or potential wounds on your backside. Additional Orders / Instructions o Follow Nutritious Diet and Increase Protein Intake Wound Treatment Wound #1 - Lower Leg Wound Laterality: Left, Medial Topical: Gentamicin  3 x Per Week/30 Days Discharge Instructions: Apply as directed by provider. Apply to wound bed with ketoconazole Topical: Triamcinolone Acetonide Cream, 0.1%, 15 (g) tube 3 x Per Week/30 Days Discharge Instructions: Apply as directed by provider. Apply to leg. for excoriated areas, mixed with desitin and apply Topical: Ketoconazole Cream 2%, 30 (g), tube 3 x Per Week/30 Days Discharge Instructions: apply 1:1 ratio with gentamicin, apply to wound bed. Nystatin in office Topical: Desitin Maximum Strength Ointment, 1 (oz) tube 3 x Per Week/30 Days Discharge Instructions: mixed with TCA and applied to excoriated areas Primary Dressing: Cutimed Sorbact 1.5x 2.38 (in/in) 3 x Per Week/30 Days Discharge Instructions: A bacteria- and fungi binding wound dressing, suitable for cavities and fistulas. It is suitable as a wound filler and allows the passage of wound exudate into a secondary dressing. The dressing helps reducing odor and pain and can improve healing. Secondary Dressing: Zetuvit Plus Silicone Non-bordered 5x5 (in/in) 3 x Per Week/30 Days Compression Wrap: Profore Lite LF 3 Multilayer Compression Bandaging System 3 x Per Week/30 Days Discharge Instructions: Apply 3 multi-layer wrap as prescribed. Laura Santiago, Laura Santiago ([id number]) Electronic Signature(s) Signed: 02/08/2022 10:36:46 AM By: Kalman Shan DO Signed: 02/08/2022 3:02:55 PM By: Levora Dredge Previous Signature: 02/08/2022 9:34:39 AM Version By: Kalman Shan DO Entered By: Levora Dredge on  02/08/2022 10:08:08 Laura Santiago, Laura Santiago ([id number]) -------------------------------------------------------------------------------- Problem List Details Patient Name: Laura Santiago, Laura Santiago. Date of Service: 02/08/2022 9:00 AM Medical Record Number: [id number] Patient Account Number: [id number] Date of Birth/Sex: 1946/10/12 (76 y.o. F) Treating RN: Levora Dredge Primary Care Provider: Orvis Brill, DOCTORS Other Clinician: Referring Provider: HOUSECALLS, DOCTORS Treating Provider/Extender: Yaakov Guthrie in Treatment: 31 Active Problems ICD-10 Encounter Code Description Active Date MDM Diagnosis L97.822 Non-pressure chronic ulcer of other part of left lower leg with fat layer 11/02/2021 No Yes exposed I87.312 Chronic venous hypertension (idiopathic) with ulcer of left lower 11/02/2021 No Yes extremity S91.201D Unspecified open wound of right great toe with damage to nail, 08/31/2021 No Yes subsequent encounter I87.2 Venous insufficiency (chronic) (peripheral) 07/06/2021 No Yes Z79.01 Long term (current) use of anticoagulants 07/06/2021 No Yes I10 Essential (primary) hypertension 07/06/2021 No Yes 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 07/06/2021 No Yes Inactive Problems ICD-10 Code Description Active Date Inactive Date S81.802A Unspecified open wound, left lower leg, initial encounter 07/06/2021 07/06/2021 S91.101A Unspecified open wound of right great toe without damage to nail, initial 08/24/2021 08/24/2021 encounter S91.104A Unspecified open wound of right lesser toe(s) without damage to nail, initial 08/24/2021 08/24/2021 encounter Resolved Problems ICD-10 Laura Santiago, Laura Santiago ([id number]) Code Description Active Date Resolved Date S91.104D Unspecified open wound of right lesser toe(s) without damage to nail, 08/31/2021 08/31/2021 subsequent encounter Electronic Signature(s) Signed: 02/08/2022 9:34:39 AM By: Kalman Shan DO Entered By: Kalman Shan on 02/08/2022  09:28:53 Laura Santiago ([id number]) -------------------------------------------------------------------------------- Progress Note Details Patient Name: Laura Santiago. Date of Service: 02/08/2022 9:00 AM Medical Record Number: [id number] Patient Account Number: [id number] Date of Birth/Sex: [birth year] (76 y.o. F) Treating RN: Levora Dredge Primary Care Provider: Priscille Kluver Other Clinician: Referring Provider: HOUSECALLS, DOCTORS Treating Provider/Extender: Yaakov Guthrie in Treatment: 31 Subjective Chief Complaint Information obtained from Patient Left lower extremity wound Right toe wounds Left upper lateral thigh wounds History of Present Illness (HPI) Admission 7/27 Ms. Talayah Picardi is a 76 year old female with a past medical history of ADHD, metastatic breast cancer, stage IV chronic kidney disease, history of DVT on Xarelto and chronic venous insufficiency that presents to the clinic for a chronic left  lower extremity wound. She recently moved to [facility] 4 days ago. She was being followed by wound care center in [name]. She reports a 10-year history of wounds to her left lower extremity that eventually do heal with debridement and compression therapy. She states that the current wound reopened 4 months ago and she is using Vaseline and Coban. She denies signs of infection. 8/3; patient presents for 1 week follow-up. She reports no issues or complaints today. She states she had vascular studies done in the last week. She denies signs of infection. She brought her little service dog with her today. 8/17; patient presents for follow-up. She has missed her last clinic appointment. She states she took the wrap off and attempted to rewrap her leg. She is having difficulty with transportation. She has her service dog with her today. Overall she feels well and reports improvement in wound healing. She denies signs of infection. She reports owning an  old Velcro wrap compression and has this at her living facility 9/14; patient presents for follow-up. Patient states that over the past 2 to 3 weeks she developed toe wounds to her right foot. She attributes this to tight fitting shoes. She subsequently developed cellulitis in the right leg and has been treated by doxycycline by her oncologist. She reports improvement in symptoms however continues to have some redness and swelling to this leg. To the left lower extremity patient has been having her wraps changed with home health twice weekly. She states that the [facility] is not helping control the drainage. Other than that she has no issues or complaints today. She denies signs of infection to the left lower extremity. 9/21; patient presents for follow-up. She reports seeing infectious disease for her cellulitis. She reports no further management. She has home health that changes the wraps twice weekly. She has no issues or complaints today. She denies signs of infection. 10/5; patient presents for follow-up. She has no issues or complaints today. She denies signs of infection. She states that the right great toe has not been dressed by home health. 10/12; patient presents for follow-up. She has no issues or complaints today. She reports improvement in her wound healing. She has been using silver alginate to the right great toe wound. She denies signs of infection. 10/26; patient presents for follow-up. Home health did not have sorbact so they continued to use Hydrofera Blue under the wrap. She has been using silver alginate to the great toe wound however she did not have a dressing in place today. She currently denies signs of infection. 11/2; patient presents for follow-up. She has been using sorb act under the compression wrap. She reports using silver alginate to the toe wound again she does not have a dressing in place. She currently denies signs of infection. 11/23; patient presents for  follow-up. Unfortunately she has missed her last 2 clinic appointments. She was last seen 3 weeks ago. She did her own compression wrap with Kerlix and Coban yesterday after seeing vein and vascular. She has not been dressing her right great toe wound. She currently denies signs of infection. 11/30; patient presents for 1 week follow-up. She states she changed her dressing last week prior to home health and use sorb act with Dakin's and Hydrofera Blue. Home health has changed the dressing as well and they have been using sorbact. Today she reports increased redness to her right lower extremity. She has a history of cellulitis to this leg. She has been using  silver alginate to the right great toe. Unfortunately she had an episode of diarrhea prior to coming in and had feces all over the right leg and to the wrap of her left leg. 12/7; patient presents for 1 week follow-up. She states that home health did not come out to change the dressing and she took it off yesterday. It is unclear if she is dressing the right toe wound. She denies signs of infection. 12/14; patient presents for 1 week follow-up. She has no issues or complaints today. 12/21; patient presents for follow-up. She has no issues or complaints today. She denies signs of infection. 12/28/2021; patient presents for follow-up. She was hospitalized for sepsis secondary to right lower extremity cellulitis On 12/23. She states she is currently at a SNF. She states that she was started on doxycycline this morning for her right great toe swelling and redness. She is not sure what dressings have been done to her left lower extremity for the past 3 weeks. She says its been mainly gauze with an Ace wrap. 1/25; patient presents for follow-up. She is still residing in a skilled nursing facility. She reports mild pain to the left lower extremity wound bed. She states she is going to see a podiatrist soon. 2/8; patient presents for follow-up. She has  moved back to her residential community from her skilled nursing facility. She has no issues or complaints today. She denies signs of systemic infections. Laura Santiago, Laura Santiago ([id number]) 2/15; patient presents for follow-up. He has no issues or complaints today. She denies systemic signs of infection. 2/22; patient presents for follow-up. She has no issues or complaints today. She denies signs of infection. 3/1; patient presents for follow-up. She states that home health came out the day after she was seen in our clinic and yesterday to do the wrap change. She denies signs of infection. She reports excoriated skin on the ankle. Objective Constitutional Vitals Time Taken: 9:08 AM, Height: 66 in, Weight: 153 lbs, BMI: 24.7, Temperature: 98.3 F, Pulse: 91 bpm, Respiratory Rate: 18 breaths/min, Blood Pressure: 135/70 mmHg. General Notes: Left lower extremity: Large open wound to the medial aspect with Granulation tissue and nonviable tissue. No surrounding signs of infection. Skin breakdown to the foot and ankle from excess drainage. Integumentary (Hair, Skin) Wound #1 status is Open. Original cause of wound was Gradually Appeared. The date acquired was: 04/06/2021. The wound has been in treatment 31 weeks. The wound is located on the Left,Medial Lower Leg. The wound measures 9.5cm length x 10.5cm width x 0.1cm depth; 78.343cm^2 area and 7.834cm^3 volume. There is Fat Layer (Subcutaneous Tissue) exposed. There is no tunneling or undermining noted. There is a large amount of serosanguineous drainage noted. The wound margin is flat and intact. There is medium (34-66%) red, pink granulation within the wound bed. There is a medium (34-66%) amount of necrotic tissue within the wound bed including Adherent Slough. Assessment Active Problems ICD-10 Non-pressure chronic ulcer of other part of left lower leg with fat layer exposed Chronic venous hypertension (idiopathic) with ulcer of left lower  extremity Unspecified open wound of right great toe with damage to nail, subsequent encounter Venous insufficiency (chronic) (peripheral) Long term (current) use of anticoagulants Essential (primary) hypertension Secondary malignant neoplasm of breast Patient's wound is stable. I debrided nonviable tissue. I recommended continuing with current therapy of gentamicin ointment, ketoconazole and sorbact under 3 layer compression. We will add extra zinc oxide and TCA cream to the foot. Unfortunately home healths visits were not  beneficial as they occurred the day after she was wrapped here and yesterday. 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 There was a Excisional Skin/Subcutaneous Tissue Debridement with a total area of 99.75 sq cm performed by Kalman Shan, MD. With the following instrument(s): Curette Material removed includes Subcutaneous Tissue and Slough and. No specimens were taken. A time out was conducted at 09:22, prior to the start of the procedure. A Minimum amount of bleeding was controlled with Pressure. The procedure was tolerated well. Post Debridement Measurements: 9.5cm length x 10.5cm width x 0.1cm depth; 7.834cm^3 volume. Character of Wound/Ulcer Post Debridement is stable. Severity of Tissue Post Debridement is: Fat layer exposed. Post procedure Diagnosis Wound #1: Same as Pre-Procedure Laura Santiago, Laura Santiago ([id number]) Plan Follow-up Appointments: Return Appointment in 1 week. Nurse Visit as needed Home Health: [facility]: - [facility] for wound care. May utilize formulary equivalent dressing for wound treatment orders unless otherwise specified. Home Health Nurse may visit [facility] to address patient s wound care needs. **Please direct any NON-WOUND related issues/requests for orders to patient's Primary Care  Physician. **If current dressing causes regression in wound condition, may D/C ordered dressing product/s and apply Normal Saline Moist Dressing daily until next [facility] or Other MD appointment. **Notify [facility] of regression in wound condition at [phone]. Other Home Health Orders/Instructions: - Dressing change 3 x weekly, once by wound care and once at wound clinic weekly Bathing/ Shower/ Hygiene: May shower with wound dressing protected with water repellent cover or cast protector. No tub bath. Anesthetic (Use 'Patient Medications' Section for Anesthetic Order Entry): Lidocaine applied to wound bed Edema Control - Lymphedema / Segmental Compressive Device / Other: Optional: One layer of unna paste to top of compression wrap (to act as an anchor). Elevate leg(s) parallel to the floor when sitting. DO YOUR BEST to sleep in the bed at night. DO NOT sleep in your recliner. Long hours of sitting in a recliner leads to swelling of the legs and/or potential wounds on your backside. Additional Orders / Instructions: Follow Nutritious Diet and Increase Protein Intake WOUND #1: - Lower Leg Wound Laterality: Left, Medial Topical: Gentamicin 3 x Per Week/30 Days Discharge Instructions: Apply as directed by provider. Apply to wound bed with ketoconazole Topical: Triamcinolone Acetonide Cream, 0.1%, 15 (g) tube 3 x Per Week/30 Days Discharge Instructions: Apply as directed by provider. Apply to leg. for excoriated areas, mixed with desitin and apply Topical: Ketoconazole Cream 2%, 30 (g), tube 3 x Per Week/30 Days Discharge Instructions: apply 1:1 ratio with gentamicin, apply to wound bed. Nystatin in office Topical: Desitin Maximum Strength Ointment, 1 (oz) tube 3 x Per Week/30 Days Discharge Instructions: mixed with TCA and applied to excoriated areas Primary Dressing: Cutimed Sorbact 1.5x 2.38 (in/in) 3 x Per Week/30 Days Discharge Instructions: A bacteria- and fungi  binding wound dressing, suitable for cavities and fistulas. It is suitable as a wound filler and allows the passage of wound exudate into a secondary dressing. The dressing helps reducing odor and pain and can improve healing. Secondary Dressing: Zetuvit Plus Silicone Non-bordered 5x5 (in/in) 3 x Per Week/30 Days Compression Wrap: Profore Lite LF 3 Multilayer Compression Bandaging System 3 x Per Week/30 Days Discharge Instructions: Apply 3 multi-layer wrap as prescribed. 1. In office sharp debridement 2. Ketoconazole, gentamicin and Sorbact under 3 layer compression 3.  Follow-up in 1 week Electronic Signature(s) Signed: 02/08/2022 9:34:39 AM By: Kalman Shan DO Entered By: Kalman Shan on 02/08/2022 09:33:39 Laura Santiago ([id number]) -------------------------------------------------------------------------------- SuperBill Details Patient Name: Laura Santiago. Date of Service: 02/08/2022 Medical Record Number: [id number] Patient Account Number: [id number] Date of Birth/Sex: [birth year] (76 y.o. 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 S91.201D Unspecified open wound of right great toe with damage to nail, subsequent encounter I87.2 Venous insufficiency (chronic) (peripheral) Z79.01 Long term (current) use of anticoagulants I10 Essential (primary) hypertension 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 Facility Procedures CPT4 Code: 25638937 Description: 34287 - DEB SUBQ TISSUE 20 SQ CM/< Modifier: Quantity: 1 CPT4 Code: Description: ICD-10 Diagnosis Description L97.822 Non-pressure chronic  ulcer of other part of left lower leg with fat layer Modifier: exposed Quantity: CPT4 Code: [id number] Description: 20355 - DEB SUBQ TISS EA ADDL 20CM Modifier: Quantity: 4 CPT4 Code: Description: ICD-10 Diagnosis Description L97.822 Non-pressure chronic ulcer of other part of left lower leg with fat layer Modifier: exposed Quantity: Physician Procedures CPT4 Code: 9741638 Description: 11042 - WC PHYS SUBQ TISS 20 SQ CM Modifier: Quantity: 1 CPT4 Code: Description: ICD-10 Diagnosis Description L97.822 Non-pressure chronic ulcer of other part of left lower leg with fat layer Modifier: exposed Quantity: CPT4 Code: [id number] Description: 11045 - WC PHYS SUBQ TISS EA ADDL 20 CM Modifier: Quantity: 4 CPT4 Code: Description: ICD-10 Diagnosis Description L97.822 Non-pressure chronic ulcer of other part of left lower leg with fat layer Modifier: exposed Quantity: Electronic Signature(s) Signed: 02/08/2022 9:34:39 AM By: Kalman Shan DO Entered By: Kalman Shan on 02/08/2022 09:33:50

## 2022-02-12 ENCOUNTER — Other Ambulatory Visit (INDEPENDENT_AMBULATORY_CARE_PROVIDER_SITE_OTHER): Payer: Self-pay | Admitting: Nurse Practitioner

## 2022-02-13 ENCOUNTER — Other Ambulatory Visit: Payer: Self-pay

## 2022-02-13 ENCOUNTER — Encounter
Admission: RE | Disposition: A | Discharge status: Home / Self Care | Payer: Self-pay | Source: Home / Self Care | Attending: Vascular Surgery

## 2022-02-13 ENCOUNTER — Telehealth: Payer: Self-pay

## 2022-02-13 ENCOUNTER — Encounter: Payer: Self-pay | Admitting: Vascular Surgery

## 2022-02-13 ENCOUNTER — Ambulatory Visit
Admission: RE | Admit: 2022-02-13 | Discharge: 2022-02-13 | Disposition: A | Discharge status: Home / Self Care | Payer: Medicare Other | Attending: Vascular Surgery | Admitting: Vascular Surgery

## 2022-02-13 DIAGNOSIS — I129 Hypertensive chronic kidney disease with stage 1 through stage 4 chronic kidney disease, or unspecified chronic kidney disease: Secondary | ICD-10-CM | POA: Insufficient documentation

## 2022-02-13 DIAGNOSIS — C50912 Malignant neoplasm of unspecified site of left female breast: Secondary | ICD-10-CM | POA: Insufficient documentation

## 2022-02-13 DIAGNOSIS — N184 Chronic kidney disease, stage 4 (severe): Secondary | ICD-10-CM | POA: Diagnosis not present

## 2022-02-13 DIAGNOSIS — C7951 Secondary malignant neoplasm of bone: Secondary | ICD-10-CM | POA: Diagnosis not present

## 2022-02-13 DIAGNOSIS — C50919 Malignant neoplasm of unspecified site of unspecified female breast: Secondary | ICD-10-CM

## 2022-02-13 DIAGNOSIS — Z86718 Personal history of other venous thrombosis and embolism: Secondary | ICD-10-CM | POA: Insufficient documentation

## 2022-02-13 DIAGNOSIS — Z79899 Other long term (current) drug therapy: Secondary | ICD-10-CM | POA: Insufficient documentation

## 2022-02-13 DIAGNOSIS — Z452 Encounter for adjustment and management of vascular access device: Secondary | ICD-10-CM

## 2022-02-13 DIAGNOSIS — Z7901 Long term (current) use of anticoagulants: Secondary | ICD-10-CM | POA: Diagnosis not present

## 2022-02-13 DIAGNOSIS — G893 Neoplasm related pain (acute) (chronic): Secondary | ICD-10-CM | POA: Diagnosis not present

## 2022-02-13 HISTORY — PX: PORTA CATH INSERTION: CATH118285

## 2022-02-13 SURGERY — PORTA CATH INSERTION
Anesthesia: Moderate Sedation

## 2022-02-13 MED ORDER — FENTANYL CITRATE PF 50 MCG/ML IJ SOSY
PREFILLED_SYRINGE | INTRAMUSCULAR | Status: AC
  Filled 2022-02-13: qty 1

## 2022-02-13 MED ORDER — METHYLPREDNISOLONE SODIUM SUCC 125 MG IJ SOLR: 125.0000 mg | Freq: Once | INTRAMUSCULAR | Status: DC | PRN

## 2022-02-13 MED ORDER — DIPHENHYDRAMINE HCL 50 MG/ML IJ SOLN: 50.0000 mg | Freq: Once | INTRAMUSCULAR | Status: DC | PRN

## 2022-02-13 MED ORDER — FAMOTIDINE 20 MG PO TABS
40.0000 mg | ORAL_TABLET | Freq: Once | ORAL | Status: DC | PRN
Start: 2022-02-13 — End: 2022-02-13

## 2022-02-13 MED ORDER — GENTAMICIN SULFATE 40 MG/ML IJ SOLN
80.0000 mg | Freq: Once | INTRAMUSCULAR | Status: DC
  Filled 2022-02-13: qty 2

## 2022-02-13 MED ORDER — MIDAZOLAM HCL 2 MG/ML PO SYRP
8.0000 mg | ORAL_SOLUTION | Freq: Once | ORAL | Status: DC | PRN
Start: 2022-02-13 — End: 2022-02-13

## 2022-02-13 MED ORDER — FENTANYL CITRATE (PF) 100 MCG/2ML IJ SOLN
INTRAMUSCULAR | Status: DC | PRN
  Administered 2022-02-13: 50 ug via INTRAVENOUS

## 2022-02-13 MED ORDER — MIDAZOLAM HCL 2 MG/2ML IJ SOLN
INTRAMUSCULAR | Status: DC | PRN
Start: 2022-02-13 — End: 2022-02-13
  Administered 2022-02-13: 2 mg via INTRAVENOUS

## 2022-02-13 MED ORDER — SODIUM CHLORIDE 0.9 % IV SOLN: INTRAVENOUS | Status: DC

## 2022-02-13 MED ORDER — HYDROMORPHONE HCL 1 MG/ML IJ SOLN: 1.0000 mg | Freq: Once | INTRAMUSCULAR | Status: DC | PRN

## 2022-02-13 MED ORDER — CEFAZOLIN SODIUM-DEXTROSE 2-4 GM/100ML-% IV SOLN
2.0000 g | Freq: Once | INTRAVENOUS | Status: AC
  Administered 2022-02-13: 2 g via INTRAVENOUS

## 2022-02-13 MED ORDER — CHLORHEXIDINE GLUCONATE CLOTH 2 % EX PADS
6.0000 | MEDICATED_PAD | Freq: Every day | CUTANEOUS | Status: DC
  Administered 2022-02-13: 6 via TOPICAL

## 2022-02-13 MED ORDER — ONDANSETRON HCL 4 MG/2ML IJ SOLN: 4.0000 mg | Freq: Four times a day (QID) | INTRAMUSCULAR | Status: DC | PRN

## 2022-02-13 MED ORDER — MIDAZOLAM HCL 2 MG/2ML IJ SOLN
INTRAMUSCULAR | Status: AC
  Filled 2022-02-13: qty 2

## 2022-02-13 SURGICAL SUPPLY — 13 items
COVER PROBE U/S 5X48 (MISCELLANEOUS) ×1 IMPLANT
COVER SURGICAL LIGHT HANDLE (MISCELLANEOUS) ×1 IMPLANT
DERMABOND ADVANCED (GAUZE/BANDAGES/DRESSINGS) ×1
DERMABOND ADVANCED .7 DNX12 (GAUZE/BANDAGES/DRESSINGS) IMPLANT
GLIDEWIRE ADV .035X180CM (WIRE) ×1 IMPLANT
HANDLE YANKAUER SUCT BULB TIP (MISCELLANEOUS) ×1 IMPLANT
KIT PORT POWER 8FR ISP CVUE (Port) ×1 IMPLANT
PACK ANGIOGRAPHY (CUSTOM PROCEDURE TRAY) ×2 IMPLANT
PENCIL ELECTRO HAND CTR (MISCELLANEOUS) ×1 IMPLANT
SUT MNCRL AB 4-0 PS2 18 (SUTURE) ×1 IMPLANT
SUT VIC AB 3-0 SH 27 (SUTURE) ×1
SUT VIC AB 3-0 SH 27X BRD (SUTURE) IMPLANT
TUBING CONNECTING 10 (TUBING) ×2 IMPLANT

## 2022-02-13 NOTE — Op Note (Signed)
?      [facility] ?      Operative Note ? ?Date: 02/13/2022 ? ?Preoperative diagnosis:  1.  Metastatic breast cancer ? ?Postoperative diagnosis:  Same as above ? ?Procedures: ?#1. Removal of left internal jugular port ?#2. Fluoroscopic guidance for placement of catheter. ?#3. Replacement of CT compatible Port-A-Cath, left internal jugular vein. ? ?Surgeon: Leotis Pain, MD.  ? ?Anesthesia: Local with moderate conscious sedation for approximately 25 minutes using 2 mg of Versed and 50 mcg of Fentanyl ? ?Fluoroscopy time: less than 1 minute ? ?Contrast used: 0 ? ?Estimated blood loss: 5 cc ? ?Indication for the procedure:  The patient is a 76 y.o.female with a poorly functioning left jugular Port-A-Cath and metastatic breast cancer requiring continuous therapy.  The patient needs a Port-A-Cath for durable venous access, chemotherapy, lab draws, and CT scans. We are asked to place this. Risks and benefits were discussed and informed consent was obtained. ? ?Description of procedure: The patient was brought to the vascular and interventional radiology suite.  Moderate conscious sedation was administered throughout the procedure during a face to face encounter with the patient with my supervision of the RN administering medicines and monitoring the patient's vital signs, pulse oximetry, telemetry and mental status throughout from the start of the procedure until the patient was taken to the recovery room. The left neck chest and shoulder were sterilely prepped and draped, and a sterile surgical field was created.  The area was anesthetized copiously with 1% lidocaine.  The previous transverse incision in the left subclavicular location was reopened and the port and catheter were dissected free.  The catheter was disconnected from the port and an advantage wire was placed through the catheter and the catheter was removed.  The existing port reservoir was then removed without difficulty.  This was about 4  to 5 cm short of an ideal location so a new catheter was cut about 5 cm longer than the previous catheter and placed over the wire.  The catheter tip was parked in excellent location under fluorocoscopic guidance in the SVC just above the right atrium.  It was then connected to the port in the appropriate and typical fashion and the port was parked back into the pocket and the subclavicular location.  The pocket was then irrigated with antibiotic impregnated saline and the wound was closed with a running 3-0 Vicryl and a 4-0 Monocryl. The access incision was closed with a single 4-0 Monocryl. The Huber needle was used to withdraw blood and flush the port with heparinized saline. Dermabond was then placed as a dressing. The patient tolerated the procedure well and was taken to the recovery room in stable condition. ? ? ?Leotis Pain ?02/13/2022 ?10:58 AM ? ? ?This note was created with Dragon Medical transcription system. Any errors in dictation are purely unintentional.  ?

## 2022-02-13 NOTE — Interval H&P Note (Signed)
History and Physical Interval Note: ? ?02/13/2022 ?8:06 AM ? ?Laura Santiago  has presented today for surgery, with the diagnosis of Porta Cath Insertion   Metastatic Breast Ca.  The various methods of treatment have been discussed with the patient and family. After consideration of risks, benefits and other options for treatment, the patient has consented to  Procedure(s): ?PORTA CATH INSERTION (N/A) as a surgical intervention.  The patient's history has been reviewed, patient examined, no change in status, stable for surgery.  I have reviewed the patient's chart and labs.  Questions were answered to the patient's satisfaction.   ? ? ?Leotis Pain ? ? ?

## 2022-02-13 NOTE — Telephone Encounter (Signed)
Reached out to [facility] in regards to pts' NP appointment; pt is scheduled to see Dr. Netty Starring on 02/22/2022 at 2pm. Will send daughter a mychart message to confirm appt details.  ?

## 2022-02-15 ENCOUNTER — Encounter (HOSPITAL_BASED_OUTPATIENT_CLINIC_OR_DEPARTMENT_OTHER): Payer: Medicare Other | Admitting: Internal Medicine

## 2022-02-15 ENCOUNTER — Other Ambulatory Visit: Payer: Self-pay

## 2022-02-15 DIAGNOSIS — S91101A Unspecified open wound of right great toe without damage to nail, initial encounter: Secondary | ICD-10-CM | POA: Diagnosis not present

## 2022-02-15 DIAGNOSIS — L97822 Non-pressure chronic ulcer of other part of left lower leg with fat layer exposed: Secondary | ICD-10-CM | POA: Diagnosis not present

## 2022-02-15 NOTE — Progress Notes (Signed)
Laura Santiago ([id number]) Visit Report for 02/15/2022 Chief Complaint Document Details Patient Name: Laura Santiago, Laura Santiago. Date of Service: 02/15/2022 9:00 AM Medical Record Number: [id number] Patient Account Number: [id number] Date of Birth/Sex: [birth year] (76 y.o. F) Treating RN: Levora Dredge Primary Care Provider: Orvis Brill, DOCTORS Other Clinician: Referring Provider: Orvis Brill, DOCTORS Treating Provider/Extender: Yaakov Guthrie in Treatment: 32 Information Obtained from: Patient Chief Complaint Left lower extremity wound Right toe wounds Left upper lateral thigh wounds Electronic Signature(s) Signed: 02/15/2022 11:05:51 AM By: Kalman Shan DO Entered By: Kalman Shan on 02/15/2022 09:38:53 Laura Santiago ([id number]) -------------------------------------------------------------------------------- Debridement Details Patient Name: Laura Santiago. Date of Service: 02/15/2022 9:00 AM Medical Record Number: [id number] Patient Account Number: [id number] Date of Birth/Sex: 03-15-1946 (76 y.o. F) Treating RN: Levora Dredge Primary Care Provider: Orvis Brill, DOCTORS Other Clinician: Referring Provider: Orvis Brill, DOCTORS Treating Provider/Extender: Yaakov Guthrie in Treatment: 32 Debridement Performed for Wound #1 Left,Medial Lower Leg Assessment: Performed By: Physician Kalman Shan, MD Debridement Type: Debridement Severity of Tissue Pre Debridement: Fat layer exposed Level of Consciousness (Pre- Awake and Alert procedure): Pre-procedure Verification/Time Out Yes - 09:30 Taken: Total Area Debrided (L x W): 9 (cm) x 10.5 (cm) = 94.5 (cm) Tissue and other material Viable, Non-Viable, Slough, Subcutaneous, Biofilm, Slough debrided: Level: Skin/Subcutaneous Tissue Debridement Description: Excisional Instrument: Curette Bleeding: Minimum Hemostasis Achieved: Pressure Response to Treatment: Procedure was tolerated well Level of  Consciousness (Post- Awake and Alert procedure): Post Debridement Measurements of Total Wound Length: (cm) 9 Width: (cm) 10.5 Depth: (cm) 0.1 Volume: (cm) 7.422 Character of Wound/Ulcer Post Debridement: Stable Severity of Tissue Post Debridement: Fat layer exposed Post Procedure Diagnosis Same as Pre-procedure Electronic Signature(s) Signed: 02/15/2022 11:05:51 AM By: Kalman Shan DO Signed: 02/15/2022 3:52:56 PM By: Levora Dredge Entered By: Levora Dredge on 02/15/2022 09:32:42 Laura Santiago ([id number]) -------------------------------------------------------------------------------- HPI Details Patient Name: Laura Santiago. Date of Service: 02/15/2022 9:00 AM Medical Record Number: [id number] Patient Account Number: [id number] Date of Birth/Sex: 11-29-1946 (76 y.o. F) Treating RN: Levora Dredge Primary Care Provider: Priscille Kluver Other Clinician: Referring Provider: Orvis Brill, DOCTORS Treating Provider/Extender: Yaakov Guthrie in Treatment: 32 History of Present Illness HPI Description: Admission 7/27 Laura Santiago is a 76 year old female with a past medical history of ADHD, metastatic breast cancer, stage IV chronic kidney disease, history of DVT on Xarelto and chronic venous insufficiency that presents to the clinic for a chronic left lower extremity wound. She recently moved to [facility] 4 days ago. She was being followed by wound care center in [name]. She reports a 10-year history of wounds to her left lower extremity that eventually do heal with debridement and compression therapy. She states that the current wound reopened 4 months ago and she is using Vaseline and Coban. She denies signs of infection. 8/3; patient presents for 1 week follow-up. She reports no issues or complaints today. She states she had vascular studies done in the last week. She denies signs of infection. She brought her little service dog with her  today. 8/17; patient presents for follow-up. She has missed her last clinic appointment. She states she took the wrap off and attempted to rewrap her leg. She is having difficulty with transportation. She has her service dog with her today. Overall she feels well and reports improvement in wound healing. She denies signs of infection. She reports owning an old Velcro wrap compression and has this at her living facility 9/14; patient presents for follow-up. Patient states that over the past 2  to 3 weeks she developed toe wounds to her right foot. She attributes this to tight fitting shoes. She subsequently developed cellulitis in the right leg and has been treated by doxycycline by her oncologist. She reports improvement in symptoms however continues to have some redness and swelling to this leg. To the left lower extremity patient has been having her wraps changed with home health twice weekly. She states that the [facility] is not helping control the drainage. Other than that she has no issues or complaints today. She denies signs of infection to the left lower extremity. 9/21; patient presents for follow-up. She reports seeing infectious disease for her cellulitis. She reports no further management. She has home health that changes the wraps twice weekly. She has no issues or complaints today. She denies signs of infection. 10/5; patient presents for follow-up. She has no issues or complaints today. She denies signs of infection. She states that the right great toe has not been dressed by home health. 10/12; patient presents for follow-up. She has no issues or complaints today. She reports improvement in her wound healing. She has been using silver alginate to the right great toe wound. She denies signs of infection. 10/26; patient presents for follow-up. Home health did not have sorbact so they continued to use Hydrofera Blue under the wrap. She has been using silver alginate to the great toe  wound however she did not have a dressing in place today. She currently denies signs of infection. 11/2; patient presents for follow-up. She has been using sorb act under the compression wrap. She reports using silver alginate to the toe wound again she does not have a dressing in place. She currently denies signs of infection. 11/23; patient presents for follow-up. Unfortunately she has missed her last 2 clinic appointments. She was last seen 3 weeks ago. She did her own compression wrap with Kerlix and Coban yesterday after seeing vein and vascular. She has not been dressing her right great toe wound. She currently denies signs of infection. 11/30; patient presents for 1 week follow-up. She states she changed her dressing last week prior to home health and use sorb act with Dakin's and Hydrofera Blue. Home health has changed the dressing as well and they have been using sorbact. Today she reports increased redness to her right lower extremity. She has a history of cellulitis to this leg. She has been using silver alginate to the right great toe. Unfortunately she had an episode of diarrhea prior to coming in and had feces all over the right leg and to the wrap of her left leg. 12/7; patient presents for 1 week follow-up. She states that home health did not come out to change the dressing and she took it off yesterday. It is unclear if she is dressing the right toe wound. She denies signs of infection. 12/14; patient presents for 1 week follow-up. She has no issues or complaints today. 12/21; patient presents for follow-up. She has no issues or complaints today. She denies signs of infection. 12/28/2021; patient presents for follow-up. She was hospitalized for sepsis secondary to right lower extremity cellulitis On 12/23. She states she is currently at a SNF. She states that she was started on doxycycline this morning for her right great toe swelling and redness. She is not sure what dressings have  been done to her left lower extremity for the past 3 weeks. She says its been mainly gauze with an Ace wrap. 1/25; patient presents for follow-up. She  is still residing in a skilled nursing facility. She reports mild pain to the left lower extremity wound bed. She states she is going to see a podiatrist soon. 2/8; patient presents for follow-up. She has moved back to her residential community from her skilled nursing facility. She has no issues or complaints today. She denies signs of systemic infections. 2/15; patient presents for follow-up. He has no issues or complaints today. She denies systemic signs of infection. 2/22; patient presents for follow-up. She has no issues or complaints today. She denies signs of infection. Laura Santiago, Laura Santiago ([id number]) 3/1; patient presents for follow-up. She states that home health came out the day after she was seen in our clinic and yesterday to do the wrap change. She denies signs of infection. She reports excoriated skin on the ankle. 3/8; patient presents for follow-up. She has no issues or complaints today. She denies signs of infection. Electronic Signature(s) Signed: 02/15/2022 11:05:51 AM By: Kalman Shan DO Entered By: Kalman Shan on 02/15/2022 09:39:12 Laura Santiago ([id number]) -------------------------------------------------------------------------------- Physical Exam Details Patient Name: Laura Santiago, Laura Santiago. Date of Service: 02/15/2022 9:00 AM Medical Record Number: [id number] Patient Account Number: [id number] Date of Birth/Sex: January 04, 1946 (76 y.o. F) Treating RN: Levora Dredge Primary Care Provider: Orvis Brill, DOCTORS Other Clinician: Referring Provider: HOUSECALLS, DOCTORS Treating Provider/Extender: Yaakov Guthrie in Treatment: 82 Constitutional . Cardiovascular . Psychiatric . Notes Left lower extremity: Large open wound to the medial aspect with Granulation tissue and nonviable tissue. No surrounding signs  of infection. Skin Irritation to the periwound Electronic Signature(s) Signed: 02/15/2022 11:05:51 AM By: Kalman Shan DO Entered By: Kalman Shan on 02/15/2022 09:39:40 Laura Santiago ([id number]) -------------------------------------------------------------------------------- Physician Orders Details Patient Name: Laura Santiago. Date of Service: 02/15/2022 9:00 AM Medical Record Number: [id number] Patient Account Number: [id number] Date of Birth/Sex: 03-08-1946 (76 y.o. F) Treating RN: Levora Dredge Primary Care Provider: Orvis Brill, DOCTORS Other Clinician: Referring Provider: Orvis Brill, DOCTORS Treating Provider/Extender: Yaakov Guthrie in Treatment: 65 Verbal / Phone Orders: No Diagnosis Coding Follow-up Appointments o Return Appointment in 1 week. o Nurse Visit as needed [place] for wound care. May utilize formulary equivalent dressing for wound treatment orders unless otherwise specified. Home Health Nurse may visit PRN to address patientos wound care needs. o **Please direct any NON-WOUND related issues/requests for orders to patient's Primary Care Physician. **If current dressing causes regression in wound condition, may D/C ordered dressing product/s and apply Normal Saline Moist Dressing daily until next [place] or Other MD appointment. **Notify Wound Healing Center of regression in wound condition at [phone]. o Other Home Health Orders/Instructions: - Dressing change 3 x weekly, once by wound care and once at wound clinic weekly. PLEASE make sure frequency between dressing changes is appropriate. Bathing/ Shower/ Hygiene o May shower with wound dressing protected with water repellent cover or cast protector. o No tub bath. Anesthetic (Use 'Patient Medications' Section for Anesthetic Order Entry) o Lidocaine applied to wound bed Edema Control -  Lymphedema / Segmental Compressive Device / Other o Optional: One layer of unna paste to top of compression wrap (to act as an anchor). o Elevate leg(s) parallel to the floor when sitting. o DO YOUR BEST to sleep in the bed at night. DO NOT sleep in your recliner. Long hours of sitting in a recliner leads to swelling of the legs and/or potential wounds on your backside. Additional Orders / Instructions o Follow Nutritious Diet and Increase  Protein Intake Wound Treatment Wound #1 - Lower Leg Wound Laterality: Left, Medial Topical: Gentamicin 3 x Per Week/30 Days Discharge Instructions: Apply as directed by provider. Apply to wound bed with ketoconazole Topical: Triamcinolone Acetonide Cream, 0.1%, 15 (g) tube 3 x Per Week/30 Days Discharge Instructions: Apply as directed by provider. Apply to leg. for excoriated areas, mixed with desitin and apply Topical: Ketoconazole Cream 2%, 30 (g), tube 3 x Per Week/30 Days Discharge Instructions: apply 1:1 ratio with gentamicin, apply to wound bed. Nystatin in office Topical: Desitin Maximum Strength Ointment, 1 (oz) tube 3 x Per Week/30 Days Discharge Instructions: mixed with TCA and applied to excoriated areas Primary Dressing: Cutimed Sorbact 1.5x 2.38 (in/in) 3 x Per Week/30 Days Discharge Instructions: A bacteria- and fungi binding wound dressing, suitable for cavities and fistulas. It is suitable as a wound filler and allows the passage of wound exudate into a secondary dressing. The dressing helps reducing odor and pain and can improve healing. Secondary Dressing: (NON-BORDER) Zetuvit Plus Silicone NON-BORDER 5x5 (in/in) 3 x Per Week/30 Days Compression Wrap: 3-LAYER WRAP - Profore Lite LF 3 Multilayer Compression Bandaging System 3 x Per Week/30 Days Discharge Instructions: Apply 3 multi-layer wrap as prescribed. Laura Santiago, Laura Santiago ([id number]) Electronic Signature(s) Signed: 02/15/2022 11:05:51 AM By: Kalman Shan DO Entered By:  Kalman Shan on 02/15/2022 09:55:33 Laura Santiago, Laura Santiago ([id number]) -------------------------------------------------------------------------------- Problem List Details Patient Name: JHENE, WESTMORELAND. Date of Service: 02/15/2022 9:00 AM Medical Record Number: [id number] Patient Account Number: [id number] Date of Birth/Sex: Jan 10, 1946 (76 y.o. F) Treating RN: Levora Dredge Primary Care Provider: Orvis Brill, DOCTORS Other Clinician: Referring Provider: HOUSECALLS, DOCTORS Treating Provider/Extender: Yaakov Guthrie in Treatment: 32 Active Problems ICD-10 Encounter Code Description Active Date MDM Diagnosis L97.822 Non-pressure chronic ulcer of other part of left lower leg with fat layer 11/02/2021 No Yes exposed I87.312 Chronic venous hypertension (idiopathic) with ulcer of left lower 11/02/2021 No Yes extremity S91.201D Unspecified open wound of right great toe with damage to nail, 08/31/2021 No Yes subsequent encounter I87.2 Venous insufficiency (chronic) (peripheral) 07/06/2021 No Yes Z79.01 Long term (current) use of anticoagulants 07/06/2021 No Yes I10 Essential (primary) hypertension 07/06/2021 No Yes 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 07/06/2021 No Yes Inactive Problems ICD-10 Code Description Active Date Inactive Date S81.802A Unspecified open wound, left lower leg, initial encounter 07/06/2021 07/06/2021 S91.101A Unspecified open wound of right great toe without damage to nail, initial 08/24/2021 08/24/2021 encounter S91.104A Unspecified open wound of right lesser toe(s) without damage to nail, initial 08/24/2021 08/24/2021 encounter Resolved Problems ICD-10 NAELA, NODAL ([id number]) Code Description Active Date Resolved Date S91.104D Unspecified open wound of right lesser toe(s) without damage to nail, 08/31/2021 08/31/2021 subsequent encounter Electronic Signature(s) Signed: 02/15/2022 11:05:51 AM By: Kalman Shan DO Entered By: Kalman Shan on  02/15/2022 09:38:47 Laura Santiago ([id number]) -------------------------------------------------------------------------------- Progress Note Details Patient Name: Laura Santiago. Date of Service: 02/15/2022 9:00 AM Medical Record Number: [id number] Patient Account Number: [id number] Date of Birth/Sex: 1946-07-17 (76 y.o. F) Treating RN: Levora Dredge Primary Care Provider: Priscille Kluver Other Clinician: Referring Provider: Orvis Brill, DOCTORS Treating Provider/Extender: Yaakov Guthrie in Treatment: 32 Subjective Chief Complaint Information obtained from Patient Left lower extremity wound Right toe wounds Left upper lateral thigh wounds History of Present Illness (HPI) Admission 7/27 Ms. Arlo Buffone is a 76 year old female with a past medical history of ADHD, metastatic breast cancer, stage IV chronic kidney disease, history of DVT on Xarelto and chronic venous insufficiency that presents to the clinic for a  chronic left lower extremity wound. She recently moved to [facility] 4 days ago. She was being followed by wound care center in [name]. She reports a 10-year history of wounds to her left lower extremity that eventually do heal with debridement and compression therapy. She states that the current wound reopened 4 months ago and she is using Vaseline and Coban. She denies signs of infection. 8/3; patient presents for 1 week follow-up. She reports no issues or complaints today. She states she had vascular studies done in the last week. She denies signs of infection. She brought her little service dog with her today. 8/17; patient presents for follow-up. She has missed her last clinic appointment. She states she took the wrap off and attempted to rewrap her leg. She is having difficulty with transportation. She has her service dog with her today. Overall she feels well and reports improvement in wound healing. She denies signs of infection. She reports  owning an old Velcro wrap compression and has this at her living facility 9/14; patient presents for follow-up. Patient states that over the past 2 to 3 weeks she developed toe wounds to her right foot. She attributes this to tight fitting shoes. She subsequently developed cellulitis in the right leg and has been treated by doxycycline by her oncologist. She reports improvement in symptoms however continues to have some redness and swelling to this leg. To the left lower extremity patient has been having her wraps changed with home health twice weekly. She states that the [facility] is not helping control the drainage. Other than that she has no issues or complaints today. She denies signs of infection to the left lower extremity. 9/21; patient presents for follow-up. She reports seeing infectious disease for her cellulitis. She reports no further management. She has home health that changes the wraps twice weekly. She has no issues or complaints today. She denies signs of infection. 10/5; patient presents for follow-up. She has no issues or complaints today. She denies signs of infection. She states that the right great toe has not been dressed by home health. 10/12; patient presents for follow-up. She has no issues or complaints today. She reports improvement in her wound healing. She has been using silver alginate to the right great toe wound. She denies signs of infection. 10/26; patient presents for follow-up. Home health did not have sorbact so they continued to use Hydrofera Blue under the wrap. She has been using silver alginate to the great toe wound however she did not have a dressing in place today. She currently denies signs of infection. 11/2; patient presents for follow-up. She has been using sorb act under the compression wrap. She reports using silver alginate to the toe wound again she does not have a dressing in place. She currently denies signs of infection. 11/23; patient  presents for follow-up. Unfortunately she has missed her last 2 clinic appointments. She was last seen 3 weeks ago. She did her own compression wrap with Kerlix and Coban yesterday after seeing vein and vascular. She has not been dressing her right great toe wound. She currently denies signs of infection. 11/30; patient presents for 1 week follow-up. She states she changed her dressing last week prior to home health and use sorb act with Dakin's and Hydrofera Blue. Home health has changed the dressing as well and they have been using sorbact. Today she reports increased redness to her right lower extremity. She has a history of cellulitis to this leg. She has  been using silver alginate to the right great toe. Unfortunately she had an episode of diarrhea prior to coming in and had feces all over the right leg and to the wrap of her left leg. 12/7; patient presents for 1 week follow-up. She states that home health did not come out to change the dressing and she took it off yesterday. It is unclear if she is dressing the right toe wound. She denies signs of infection. 12/14; patient presents for 1 week follow-up. She has no issues or complaints today. 12/21; patient presents for follow-up. She has no issues or complaints today. She denies signs of infection. 12/28/2021; patient presents for follow-up. She was hospitalized for sepsis secondary to right lower extremity cellulitis On 12/23. She states she is currently at a SNF. She states that she was started on doxycycline this morning for her right great toe swelling and redness. She is not sure what dressings have been done to her left lower extremity for the past 3 weeks. She says its been mainly gauze with an Ace wrap. 1/25; patient presents for follow-up. She is still residing in a skilled nursing facility. She reports mild pain to the left lower extremity wound bed. She states she is going to see a podiatrist soon. 2/8; patient presents for  follow-up. She has moved back to her residential community from her skilled nursing facility. She has no issues or complaints today. She denies signs of systemic infections. Laura Santiago, Laura Santiago ([id number]) 2/15; patient presents for follow-up. He has no issues or complaints today. She denies systemic signs of infection. 2/22; patient presents for follow-up. She has no issues or complaints today. She denies signs of infection. 3/1; patient presents for follow-up. She states that home health came out the day after she was seen in our clinic and yesterday to do the wrap change. She denies signs of infection. She reports excoriated skin on the ankle. 3/8; patient presents for follow-up. She has no issues or complaints today. She denies signs of infection. Patient History Information obtained from Patient. Social History Never smoker. Medical History Eyes Denies history of Cataracts, Glaucoma, Optic Neuritis Ear/Nose/Mouth/Throat Denies history of Chronic sinus problems/congestion, Middle ear problems Hematologic/Lymphatic Denies history of Anemia, Hemophilia, Human Immunodeficiency Virus, Lymphedema, Sickle Cell Disease Respiratory Denies history of Aspiration, Asthma, Chronic Obstructive Pulmonary Disease (COPD), Pneumothorax, Sleep Apnea, Tuberculosis Cardiovascular Patient has history of Hypertension Denies history of Angina, Arrhythmia, Congestive Heart Failure, Coronary Artery Disease, Deep Vein Thrombosis, Hypotension, Myocardial Infarction, Peripheral Arterial Disease, Peripheral Venous Disease, Phlebitis, Vasculitis Gastrointestinal Denies history of Cirrhosis , Colitis, Crohn s, Hepatitis A, Hepatitis B, Hepatitis C Endocrine Denies history of Type I Diabetes, Type II Diabetes Genitourinary Denies history of End Stage Renal Disease Immunological Denies history of Lupus Erythematosus, Raynaud s, Scleroderma Integumentary (Skin) Denies history of History of Burn, History of  pressure wounds Musculoskeletal Patient has history of Osteoarthritis Denies history of Gout, Rheumatoid Arthritis, Osteomyelitis Oncologic Patient has history of Received Chemotherapy, Received Radiation Medical And Surgical History Notes Oncologic breast cancer Objective Constitutional Vitals Time Taken: 9:13 AM, Height: 66 in, Weight: 153 lbs, BMI: 24.7, Temperature: 98.4 F, Pulse: 89 bpm, Respiratory Rate: 18 breaths/min, Blood Pressure: 130/61 mmHg. General Notes: Left lower extremity: Large open wound to the medial aspect with Granulation tissue and nonviable tissue. No surrounding signs of infection. Skin Irritation to the periwound Integumentary (Hair, Skin) Wound #1 status is Open. Original cause of wound was Gradually Appeared. The date acquired was: 04/06/2021. The wound has been  in treatment 32 weeks. The wound is located on the Left,Medial Lower Leg. The wound measures 9cm length x 10.5cm width x 0.1cm depth; 74.22cm^2 area and 7.422cm^3 volume. There is Fat Layer (Subcutaneous Tissue) exposed. There is no tunneling or undermining noted. There is a large amount of serosanguineous drainage noted. The wound margin is flat and intact. There is medium (34-66%) red, pink granulation within the wound bed. There is a medium (34-66%) amount of necrotic tissue within the wound bed including Adherent Slough. 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 The excoriated skin to the ankle/foot is also  improved. I debrided nonviable tissue. I recommended continuing ketoconazole gentamicin and Sorbact under compression therapy. No signs of surrounding infection. Follow- up in 1 week Procedures Wound #1 Pre-procedure diagnosis of Wound #1 is a Venous Leg Ulcer located on the Left,Medial Lower Leg .Severity of Tissue Pre Debridement is: Fat layer exposed. There was a Excisional Skin/Subcutaneous Tissue Debridement with a total area of 94.5 sq cm performed by Kalman Shan, MD. With the following instrument(s): Curette to remove Viable and Non-Viable tissue/material. Material removed includes Subcutaneous Tissue, Slough, and Biofilm. No specimens were taken. A time out was conducted at 09:30, prior to the start of the procedure. A Minimum amount of bleeding was controlled with Pressure. The procedure was tolerated well. Post Debridement Measurements: 9cm length x 10.5cm width x 0.1cm depth; 7.422cm^3 volume. Character of Wound/Ulcer Post Debridement is stable. Severity of Tissue Post Debridement is: Fat layer exposed. Post procedure Diagnosis Wound #1: Same as Pre-Procedure Plan Follow-up Appointments: Return Appointment in 1 week. Nurse Visit as needed Home Health: [facility] - [facility] for wound care. May utilize formulary equivalent dressing for wound treatment orders unless otherwise specified. Home Health Nurse may visit [facility] to address patient s wound care needs. **Please direct any NON-WOUND related issues/requests for orders to patient's Primary Care Physician. **If current dressing causes regression in wound condition, may D/C ordered dressing product/s and apply Normal Saline Moist Dressing daily until next [facility] or Other MD appointment. **Notify [facility] of regression in wound condition at [phone]. Other Home Health Orders/Instructions: - Dressing change 3 x weekly, once by wound care and once at wound clinic weekly.  PLEASE make sure frequency between dressing changes is appropriate. Bathing/ Shower/ Hygiene: May shower with wound dressing protected with water repellent cover or cast protector. No tub bath. Anesthetic (Use 'Patient Medications' Section for Anesthetic Order Entry): Lidocaine applied to wound bed Edema Control - Lymphedema / Segmental Compressive Device / Other: Optional: One layer of unna paste to top of compression wrap (to act as an anchor). Elevate leg(s) parallel to the floor when sitting. DO YOUR BEST to sleep in the bed at night. DO NOT sleep in your recliner. Long hours of sitting in a recliner leads to swelling of the legs and/or potential wounds on your backside. Additional Orders / Instructions: Follow Nutritious Diet and Increase Protein Intake WOUND #1: - Lower Leg Wound Laterality: Left, Medial Topical: Gentamicin 3 x Per Week/30 Days Discharge Instructions: Apply as directed by provider. Apply to  wound bed with ketoconazole Topical: Triamcinolone Acetonide Cream, 0.1%, 15 (g) tube 3 x Per Week/30 Days Discharge Instructions: Apply as directed by provider. Apply to leg. for excoriated areas, mixed with desitin and apply Topical: Ketoconazole Cream 2%, 30 (g), tube 3 x Per Week/30 Days Discharge Instructions: apply 1:1 ratio with gentamicin, apply to wound bed. Nystatin in office Laura Santiago, Laura Santiago. ([id number]) Topical: Desitin Maximum Strength Ointment, 1 (oz) tube 3 x Per Week/30 Days Discharge Instructions: mixed with TCA and applied to excoriated areas Primary Dressing: Cutimed Sorbact 1.5x 2.38 (in/in) 3 x Per Week/30 Days Discharge Instructions: A bacteria- and fungi binding wound dressing, suitable for cavities and fistulas. It is suitable as a wound filler and allows the passage of wound exudate into a secondary dressing. The dressing helps reducing odor and pain and can improve healing. Secondary Dressing: (NON-BORDER) Zetuvit Plus Silicone NON-BORDER 5x5 (in/in) 3  x Per Week/30 Days Compression Wrap: 3-LAYER WRAP - Profore Lite LF 3 Multilayer Compression Bandaging System 3 x Per Week/30 Days Discharge Instructions: Apply 3 multi-layer wrap as prescribed. 1. In office sharp debridement 2. Ketoconazole, gentamicin ointment and Sorbact under 3 layer compression 3. Follow-up in 1 week Electronic Signature(s) Signed: 02/15/2022 11:05:51 AM By: Kalman Shan DO Entered By: Kalman Shan on 02/15/2022 09:53:06 Laura Santiago ([id number]) -------------------------------------------------------------------------------- ROS/PFSH Details Patient Name: Laura Santiago. Date of Service: 02/15/2022 9:00 AM Medical Record Number: [id number] Patient Account Number: [id number] Date of Birth/Sex: 11-09-1946 (76 y.o. F) Treating RN: Levora Dredge Primary Care Provider: Orvis Brill, DOCTORS Other Clinician: Referring Provider: Orvis Brill, DOCTORS Treating Provider/Extender: Yaakov Guthrie in Treatment: 32 Information Obtained From Patient Eyes Medical History: Negative for: Cataracts; Glaucoma; Optic Neuritis Ear/Nose/Mouth/Throat Medical History: Negative for: Chronic sinus problems/congestion; Middle ear problems Hematologic/Lymphatic Medical History: Negative for: Anemia; Hemophilia; Human Immunodeficiency Virus; Lymphedema; Sickle Cell Disease Respiratory Medical History: Negative for: Aspiration; Asthma; Chronic Obstructive Pulmonary Disease (COPD); Pneumothorax; Sleep Apnea; Tuberculosis Cardiovascular Medical History: Positive for: Hypertension Negative for: Angina; Arrhythmia; Congestive Heart Failure; Coronary Artery Disease; Deep Vein Thrombosis; Hypotension; Myocardial Infarction; Peripheral Arterial Disease; Peripheral Venous Disease; Phlebitis; Vasculitis Gastrointestinal Medical History: Negative for: Cirrhosis ; Colitis; Crohnos; Hepatitis A; Hepatitis B; Hepatitis C Endocrine Medical History: Negative for: Type I  Diabetes; Type II Diabetes Genitourinary Medical History: Negative for: End Stage Renal Disease Immunological Medical History: Negative for: Lupus Erythematosus; Raynaudos; Scleroderma Integumentary (Skin) Medical History: Negative for: History of Burn; History of pressure wounds Musculoskeletal Medical History: Positive for: Osteoarthritis Laura Santiago, Laura Santiago ([id number]) Negative for: Gout; Rheumatoid Arthritis; Osteomyelitis Oncologic Medical History: Positive for: Received Chemotherapy; Received Radiation Past Medical History Notes: breast cancer Immunizations Pneumococcal Vaccine: Received Pneumococcal Vaccination: No Implantable Devices None Family and Social History Never smoker Electronic Signature(s) Signed: 02/15/2022 11:05:51 AM By: Kalman Shan DO Signed: 02/15/2022 3:52:56 PM By: Levora Dredge Entered By: Kalman Shan on 02/15/2022 09:47:21 Laura Santiago, Laura Santiago ([id number]) -------------------------------------------------------------------------------- [place] Details Patient Name: Laura Santiago. Date of Service: 02/15/2022 Medical Record Number: [id number] Patient Account Number: [id number] Date of Birth/Sex: May 17, 1946 (76 y.o. F) Treating RN: Levora Dredge Primary Care Provider: Orvis Brill, DOCTORS Other Clinician: Referring Provider: Orvis Brill, DOCTORS Treating Provider/Extender: Yaakov Guthrie in Treatment: 32 Diagnosis Coding ICD-10 Codes Code Description [phone] 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 S91.201D Unspecified open wound of right great toe with damage to nail, subsequent encounter I87.2 Venous insufficiency (chronic) (peripheral) Z79.01 Long term (current) use  of anticoagulants I10 Essential (primary) hypertension 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 Facility Procedures CPT4 Code:  [id number] Description: 95188 - DEB SUBQ TISSUE 20 SQ CM/< Modifier: Quantity: 1 CPT4 Code: Description: ICD-10 Diagnosis Description L97.822 Non-pressure chronic ulcer of other part of left lower leg with fat layer Modifier: exposed Quantity: CPT4 Code: 41660630 Description: 16010 - DEB SUBQ TISS EA ADDL 20CM Modifier: Quantity: 4 CPT4 Code: Description: ICD-10 Diagnosis Description L97.822 Non-pressure chronic ulcer of other part of left lower leg with fat layer Modifier: exposed Quantity: Physician Procedures CPT4 Code: [id number] Description: 32202 - WC PHYS SUBQ TISS 20 SQ CM Modifier: Quantity: 1 CPT4 Code: Description: ICD-10 Diagnosis Description L97.822 Non-pressure chronic ulcer of other part of left lower leg with fat layer Modifier: exposed Quantity: CPT4 Code: [id number] Description: 37628 - WC PHYS SUBQ TISS EA ADDL 20 CM Modifier: Quantity: 4 CPT4 Code: Description: ICD-10 Diagnosis Description L97.822 Non-pressure chronic ulcer of other part of left lower leg with fat layer Modifier: exposed Quantity: Electronic Signature(s) Signed: 02/15/2022 11:05:51 AM By: Kalman Shan DO Entered By: Kalman Shan on 02/15/2022 09:55:00

## 2022-02-15 NOTE — Progress Notes (Signed)
Laura Santiago, Laura Santiago ([id number]) Visit Report for 02/15/2022 Arrival Information Details Patient Name: Laura Santiago, Laura Santiago. Date of Service: 02/15/2022 9:00 AM Medical Record Number: [id number] Patient Account Number: [id number] Date of Birth/Sex: [birth year] (76 y.o. F) Treating RN: Levora Dredge Primary Care Saajan Willmon: Orvis Brill, DOCTORS Other Clinician: Referring Sutter Ahlgren: HOUSECALLS, DOCTORS Treating Tillmon Kisling/Extender: Yaakov Guthrie in Treatment: 46 Visit Information History Since Last Visit Added or deleted any medications: No Patient Arrived: Walker Any new allergies or adverse reactions: No Arrival Time: 09:11 Had a fall or experienced change in No Accompanied By: self activities of daily living that may affect Transfer Assistance: None risk of falls: Patient Identification Verified: Yes Hospitalized since last visit: No Secondary Verification Process Completed: Yes Has Dressing in Place as Prescribed: Yes Patient Has Alerts: Yes Has Compression in Place as Prescribed: Yes Patient Alerts: PT HAS SERVICE ANIMAL Pain Present Now: No ABI 07/11/21 R) 1.16 L) 1.27 Electronic Signature(s) Signed: 02/15/2022 3:52:56 PM By: Levora Dredge Entered By: Levora Dredge on 02/15/2022 09:13:35 Laura Santiago ([id number]) -------------------------------------------------------------------------------- Clinic Level of Care Assessment Details Patient Name: Laura Santiago. Date of Service: 02/15/2022 9:00 AM Medical Record Number: [id number] Patient Account Number: [id number] Date of Birth/Sex: 03-Dec-1946 (76 y.o. F) Treating RN: Levora Dredge Primary Care Kynslie Ringle: Orvis Brill, DOCTORS Other Clinician: Referring Leon Goodnow: Orvis Brill, DOCTORS Treating Charles Andringa/Extender: Yaakov Guthrie in Treatment: 32 Clinic Level of Care Assessment Items TOOL 1 Quantity Score _0  - Use when EandM and Procedure is performed on INITIAL visit 0 ASSESSMENTS - Nursing Assessment /  Reassessment _1  - General Physical Exam (combine w/ comprehensive assessment (listed just below) when performed on new 0 pt. evals) _2  - 0 Comprehensive Assessment (HX, ROS, Risk Assessments, Wounds Hx, etc.) ASSESSMENTS - Wound and Skin Assessment / Reassessment _3  - Dermatologic / Skin Assessment (not related to wound area) 0 ASSESSMENTS - Ostomy and/or Continence Assessment and Care _4  - Incontinence Assessment and Management 0 _5  - 0 Ostomy Care Assessment and Management (repouching, etc.) PROCESS - Coordination of Care _6  - Simple Patient / Family Education for ongoing care 0 _7  - 0 Complex (extensive) Patient / Family Education for ongoing care _8  - 0 Staff obtains Programmer, systems, Records, Test Results / Process Orders _9  - 0 Staff telephones HHA, Nursing Homes / Clarify orders / etc _10  - 0 Routine Transfer to another Facility (non-emergent condition) _11  - 0 Routine Hospital Admission (non-emergent condition) _12  - 0 New Admissions / Biomedical engineer / Ordering NPWT, Apligraf, etc. _13  - 0 Emergency Hospital Admission (emergent condition) PROCESS - Special Needs _14  - Pediatric / Minor Patient Management 0 _15  - 0 Isolation Patient Management _16  - 0 Hearing / Language / Visual special needs _17  - 0 Assessment of Community assistance (transportation, D/C planning, etc.) _18  - 0 Additional assistance / Altered mentation _19  - 0 Support Surface(s) Assessment (bed, cushion, seat, etc.) INTERVENTIONS - Miscellaneous _20  - External ear exam 0 _21  - 0 Patient Transfer (multiple staff / Civil Service fast streamer / Similar devices) _22  - 0 Simple Staple / Suture removal (25 or less) _23  - 0 Complex Staple / Suture removal (26 or more) _24  - 0 Hypo/Hyperglycemic Management (do not check if billed separately) _25  - 0 Ankle / Brachial Index (ABI) - do not check if billed separately Has the patient been seen at the hospital within the last three years: Yes Total Score: 0 Level Of Care:  ____ Laura Santiago ([id number]) Electronic Signature(s) Signed: 02/15/2022 3:52:56 PM By: Levora Dredge Entered By: Levora Dredge on 02/15/2022 09:35:21  Laura Santiago, Laura Santiago ([id number]) -------------------------------------------------------------------------------- Encounter Discharge Information Details Patient Name: Laura Santiago, Laura Santiago. Date of Service: 02/15/2022 9:00 AM Medical Record Number: [id number] Patient Account Number: [id number] Date of Birth/Sex: 1946/01/21 (76 y.o. F) Treating RN: Levora Dredge Primary Care Corene Resnick: Orvis Brill, DOCTORS Other Clinician: Referring Breeze Angell: Orvis Brill, DOCTORS Treating Phat Dalton/Extender: Yaakov Guthrie in Treatment: 32 Encounter Discharge Information Items Post Procedure Vitals Discharge Condition: Stable Temperature (F): 98.4 Ambulatory Status: Walker Pulse (bpm): 89 Discharge Destination: Other (Note Required) Respiratory Rate (breaths/min): 18 Orders Sent: Yes Blood Pressure (mmHg): 130/61 Transportation: Other Accompanied By: self Schedule Follow-up Appointment: Yes Clinical Summary of Care: Electronic Signature(s) Signed: 02/15/2022 3:52:56 PM By: Levora Dredge Entered By: Levora Dredge on 02/15/2022 09:38:30 Laura Santiago ([id number]) -------------------------------------------------------------------------------- Lower Extremity Assessment Details Patient Name: Laura Santiago. Date of Service: 02/15/2022 9:00 AM Medical Record Number: [id number] Patient Account Number: [id number] Date of Birth/Sex: Aug 22, 1946 (76 y.o. F) Treating RN: Levora Dredge Primary Care Dois Juarbe: Orvis Brill, DOCTORS Other Clinician: Referring Kynli Chou: HOUSECALLS, DOCTORS Treating Harley Fitzwater/Extender: Yaakov Guthrie in Treatment: 32 Edema Assessment Assessed: [Left: No] [Right: No] Edema: [Left: Ye] [Right: s] Calf Left: Right: Point of Measurement: 29 cm From Medial Instep 32.5 cm Ankle Left: Right: Point of  Measurement: 10 cm From Medial Instep 23.4 cm Vascular Assessment Pulses: Dorsalis Pedis Palpable: [Left:Yes] Electronic Signature(s) Signed: 02/15/2022 3:52:56 PM By: Levora Dredge Entered By: Levora Dredge on 02/15/2022 27:51:70 Laura Santiago ([id number]) -------------------------------------------------------------------------------- Multi Wound Chart Details Patient Name: Laura Santiago. Date of Service: 02/15/2022 9:00 AM Medical Record Number: [id number] Patient Account Number: [id number] Date of Birth/Sex: [birth year] (76 y.o. F) Treating RN: Levora Dredge Primary Care Kerim Statzer: Orvis Brill, DOCTORS Other Clinician: Referring Aerielle Stoklosa: HOUSECALLS, DOCTORS Treating Daylen Hack/Extender: Yaakov Guthrie in Treatment: 32 Vital Signs Height(in): 66 Pulse(bpm): 89 Weight(lbs): 153 Blood Pressure(mmHg): 130/61 Body Mass Index(BMI): 24.7 Temperature(F): 98.4 Respiratory Rate(breaths/min): 18 Photos: [N/A:N/A] Wound Location: Left, Medial Lower Leg N/A N/A Wounding Event: Gradually Appeared N/A N/A Primary Etiology: Venous Leg Ulcer N/A N/A Comorbid History: Hypertension, Osteoarthritis, N/A N/A Received Chemotherapy, Received Radiation Date Acquired: 04/06/2021 N/A N/A Weeks of Treatment: 32 N/A N/A Wound Status: Open N/A N/A Wound Recurrence: No N/A N/A Clustered Wound: Yes N/A N/A Measurements L x W x D (cm) 9x10.5x0.1 N/A N/A Area (cm) : 74.22 N/A N/A Volume (cm) : 7.422 N/A N/A % Reduction in Area: 17.80% N/A N/A % Reduction in Volume: 58.90% N/A N/A Classification: Full Thickness Without Exposed N/A N/A Support Structures Exudate Amount: Large N/A N/A Exudate Type: Serosanguineous N/A N/A Exudate Color: red, brown N/A N/A Wound Margin: Flat and Intact N/A N/A Granulation Amount: Medium (34-66%) N/A N/A Granulation Quality: Red, Pink N/A N/A Necrotic Amount: Medium (34-66%) N/A N/A Exposed Structures: Fat Layer (Subcutaneous Tissue): N/A  N/A Yes Fascia: No Tendon: No Muscle: No Joint: No Bone: No Epithelialization: Small (1-33%) N/A N/A Debridement: Debridement - Excisional N/A N/A Pre-procedure Verification/Time 09:30 N/A N/A Out Taken: Tissue Debrided: Subcutaneous, Slough N/A N/A Level: Skin/Subcutaneous Tissue N/A N/A Debridement Area (sq cm): 94.5 N/A N/A Instrument: Curette N/A N/A Bleeding: Minimum N/A N/A Hemostasis Achieved: Pressure N/A N/A Laura Santiago, Laura Santiago ([id number]) Debridement Treatment Procedure was tolerated well N/A N/A Response: Post Debridement 9x10.5x0.1 N/A N/A Measurements L x W x D (cm) Post Debridement Volume: 7.422 N/A N/A (cm) Procedures Performed: Debridement N/A N/A Treatment Notes Electronic Signature(s) Signed: 02/15/2022 3:52:56 PM By: Levora Dredge Entered By: Levora Dredge on 02/15/2022 09:34:24 Laura Santiago ([id number]) -------------------------------------------------------------------------------- Multi-Disciplinary Care Plan Details Patient Name:  Laura Santiago, Laura M. Date of Service: 02/15/2022 9:00 AM Medical Record Number: [id number] Patient Account Number: [id number] Date of Birth/Sex: 04-30-1946 (76 y.o. F) Treating RN: Levora Dredge Primary Care Adrick Kestler: Orvis Brill, DOCTORS Other Clinician: Referring Tyleah Loh: Orvis Brill, DOCTORS Treating Evvie [name]/Extender: Yaakov Guthrie in Treatment: 32 Active Inactive Wound/Skin Impairment Nursing Diagnoses: Impaired tissue integrity Goals: Patient/caregiver will verbalize understanding of skin care regimen Date Initiated: 07/06/2021 Date Inactivated: 07/27/2021 Target Resolution Date: 07/06/2021 Goal Status: Met Ulcer/skin breakdown will have a volume reduction of 30% by week 4 Date Initiated: 07/06/2021 Date Inactivated: 10/12/2021 Target Resolution Date: 08/06/2021 Goal Status: Unmet Unmet Reason: cont tx Ulcer/skin breakdown will have a volume reduction of 50% by week 8 Date Initiated:  07/06/2021 Target Resolution Date: 09/06/2021 Goal Status: Active Ulcer/skin breakdown will have a volume reduction of 80% by week 12 Date Initiated: 07/06/2021 Target Resolution Date: 10/06/2021 Goal Status: Active Ulcer/skin breakdown will heal within 14 weeks Date Initiated: 07/06/2021 Target Resolution Date: 11/06/2021 Goal Status: Active Interventions: Assess patient/caregiver ability to obtain necessary supplies Assess patient/caregiver ability to perform ulcer/skin care regimen upon admission and as needed Assess ulceration(s) every visit Treatment Activities: Referred to DME Jeniyah Menor for dressing supplies : 07/06/2021 Skin care regimen initiated : 07/06/2021 Notes: Electronic Signature(s) Signed: 02/15/2022 3:52:56 PM By: Levora Dredge Entered By: Levora Dredge on 02/15/2022 09:28:54 Laura Santiago ([id number]) -------------------------------------------------------------------------------- Pain Assessment Details Patient Name: Laura Santiago. Date of Service: 02/15/2022 9:00 AM Medical Record Number: [id number] Patient Account Number: [id number] Date of Birth/Sex: [birth year] (76 y.o. F) Treating RN: Levora Dredge Primary Care Monti Jilek: Orvis Brill, DOCTORS Other Clinician: Referring Nivaan Dicenzo: Orvis Brill, DOCTORS Treating Ezekiel Menzer/Extender: Yaakov Guthrie in Treatment: 32 Active Problems Location of Pain Severity and Description of Pain Patient Has Paino No Site Locations Rate the pain. Current Pain Level: 0 Pain Management and Medication Current Pain Management: Electronic Signature(s) Signed: 02/15/2022 3:52:56 PM By: Levora Dredge Entered By: Levora Dredge on 02/15/2022 09:16:14 Laura Santiago ([id number]) -------------------------------------------------------------------------------- Patient/Caregiver Education Details Patient Name: Laura Santiago. Date of Service: 02/15/2022 9:00 AM Medical Record Number: [id number] Patient Account Number:  [id number] Date of Birth/Gender: [birth year] (76 y.o. F) Treating RN: Levora Dredge Primary Care Physician: Orvis Brill, DOCTORS Other Clinician: Referring Physician: Orvis Brill, DOCTORS Treating Physician/Extender: Yaakov Guthrie in Treatment: 31 Education Assessment Education Provided To: Patient Education Topics Provided Wound/Skin Impairment: Handouts: Caring for Your Ulcer Methods: Explain/Verbal Responses: State content correctly Electronic Signature(s) Signed: 02/15/2022 3:52:56 PM By: Levora Dredge Entered By: Levora Dredge on 02/15/2022 09:35:43 Laura Santiago ([id number]) -------------------------------------------------------------------------------- Wound Assessment Details Patient Name: Laura Santiago. Date of Service: 02/15/2022 9:00 AM Medical Record Number: [id number] Patient Account Number: [id number] Date of Birth/Sex: 09-07-1946 (76 y.o. F) Treating RN: Levora Dredge Primary Care Erice Ahles: Orvis Brill, DOCTORS Other Clinician: Referring Varonica Siharath: HOUSECALLS, DOCTORS Treating Laddie Math/Extender: Yaakov Guthrie in Treatment: 32 Wound Status Wound Number: 1 Primary Venous Leg Ulcer Etiology: Wound Location: Left, Medial Lower Leg Wound Status: Open Wounding Event: Gradually Appeared Comorbid Hypertension, Osteoarthritis, Received Chemotherapy, Date Acquired: 04/06/2021 History: Received Radiation Weeks Of Treatment: 32 Clustered Wound: Yes Photos Wound Measurements Length: (cm) 9 Width: (cm) 10.5 Depth: (cm) 0.1 Area: (cm) 74.22 Volume: (cm) 7.422 % Reduction in Area: 17.8% % Reduction in Volume: 58.9% Epithelialization: Small (1-33%) Tunneling: No Undermining: No Wound Description Classification: Full Thickness Without Exposed Support Structu Wound Margin: Flat and Intact Exudate Amount: Large Exudate Type: Serosanguineous Exudate Color: red, brown res Foul Odor After Cleansing: No Slough/Fibrino Yes Wound  Bed Granulation Amount: Medium (34-66%) Exposed  Structure Granulation Quality: Red, Pink Fascia Exposed: No Necrotic Amount: Medium (34-66%) Fat Layer (Subcutaneous Tissue) Exposed: Yes Necrotic Quality: Adherent Slough Tendon Exposed: No Muscle Exposed: No Joint Exposed: No Bone Exposed: No Treatment Notes Wound #1 (Lower Leg) Wound Laterality: Left, Medial Cleanser Peri-Wound Care Topical Gentamicin Laura Santiago, Laura Santiago ([id number]) Discharge Instruction: Apply as directed by Kaliyah Gladman. Apply to wound bed with ketoconazole Triamcinolone Acetonide Cream, 0.1%, 15 (g) tube Discharge Instruction: Apply as directed by Kemesha Mosey. Apply to leg. for excoriated areas, mixed with desitin and apply Ketoconazole Cream 2%, 30 (g), tube Discharge Instruction: apply 1:1 ratio with gentamicin, apply to wound bed. Nystatin in office Desitin Maximum Strength Ointment, 1 (oz) tube Discharge Instruction: mixed with TCA and applied to excoriated areas Primary Dressing Cutimed Sorbact 1.5x 2.38 (in/in) Discharge Instruction: A bacteria- and fungi binding wound dressing, suitable for cavities and fistulas. It is suitable as a wound filler and allows the passage of wound exudate into a secondary dressing. The dressing helps reducing odor and pain and can improve healing. Secondary Dressing (NON-BORDER) Zetuvit Plus Silicone NON-BORDER 5x5 (in/in) Secured With Compression Wrap 3-LAYER WRAP - Profore Lite LF 3 Multilayer Compression Bandaging System Discharge Instruction: Apply 3 multi-layer wrap as prescribed. Compression Stockings Add-Ons Electronic Signature(s) Signed: 02/15/2022 3:52:56 PM By: Levora Dredge Entered By: Levora Dredge on 02/15/2022 09:25:51 Laura Santiago, Laura Santiago ([id number]) -------------------------------------------------------------------------------- Vitals Details Patient Name: Laura Santiago. Date of Service: 02/15/2022 9:00 AM Medical Record Number: [id number] Patient  Account Number: [id number] Date of Birth/Sex: 04-30-1946 (76 y.o. F) Treating RN: Levora Dredge Primary Care Trudy Kory: Orvis Brill, DOCTORS Other Clinician: Referring Lynisha Osuch: HOUSECALLS, DOCTORS Treating Verdon Ferrante/Extender: Yaakov Guthrie in Treatment: 32 Vital Signs Time Taken: 09:13 Temperature (F): 98.4 Height (in): 66 Pulse (bpm): 89 Weight (lbs): 153 Respiratory Rate (breaths/min): 18 Body Mass Index (BMI): 24.7 Blood Pressure (mmHg): 130/61 Reference Range: 80 - 120 mg / dl Electronic Signature(s) Signed: 02/15/2022 3:52:56 PM By: Levora Dredge Entered By: Levora Dredge on 02/15/2022 09:16:05

## 2022-02-21 ENCOUNTER — Inpatient Hospital Stay: Payer: Medicare Other | Attending: Oncology

## 2022-02-21 ENCOUNTER — Other Ambulatory Visit: Payer: Self-pay

## 2022-02-21 VITALS — BP 120/64 | HR 68 | Temp 98.3°F | Resp 18

## 2022-02-21 DIAGNOSIS — D631 Anemia in chronic kidney disease: Secondary | ICD-10-CM | POA: Insufficient documentation

## 2022-02-21 DIAGNOSIS — C50919 Malignant neoplasm of unspecified site of unspecified female breast: Secondary | ICD-10-CM

## 2022-02-21 DIAGNOSIS — Z5112 Encounter for antineoplastic immunotherapy: Secondary | ICD-10-CM | POA: Diagnosis not present

## 2022-02-21 DIAGNOSIS — Z5111 Encounter for antineoplastic chemotherapy: Secondary | ICD-10-CM | POA: Insufficient documentation

## 2022-02-21 DIAGNOSIS — N184 Chronic kidney disease, stage 4 (severe): Secondary | ICD-10-CM | POA: Diagnosis not present

## 2022-02-21 DIAGNOSIS — C7951 Secondary malignant neoplasm of bone: Secondary | ICD-10-CM | POA: Insufficient documentation

## 2022-02-21 DIAGNOSIS — Z170 Estrogen receptor positive status [ER+]: Secondary | ICD-10-CM | POA: Diagnosis not present

## 2022-02-21 DIAGNOSIS — C50911 Malignant neoplasm of unspecified site of right female breast: Secondary | ICD-10-CM | POA: Insufficient documentation

## 2022-02-21 DIAGNOSIS — I129 Hypertensive chronic kidney disease with stage 1 through stage 4 chronic kidney disease, or unspecified chronic kidney disease: Secondary | ICD-10-CM | POA: Diagnosis not present

## 2022-02-21 DIAGNOSIS — M546 Pain in thoracic spine: Secondary | ICD-10-CM | POA: Insufficient documentation

## 2022-02-21 MED ORDER — SODIUM CHLORIDE 0.9 % IV SOLN
Freq: Once | INTRAVENOUS | Status: AC
  Filled 2022-02-21: qty 250

## 2022-02-21 MED ORDER — SODIUM CHLORIDE 0.9 % IV SOLN
420.0000 mg | Freq: Once | INTRAVENOUS | Status: AC
  Administered 2022-02-21: 420 mg via INTRAVENOUS
  Filled 2022-02-21: qty 14

## 2022-02-21 MED ORDER — TRASTUZUMAB-DKST CHEMO 150 MG IV SOLR
6.0000 mg/kg | Freq: Once | INTRAVENOUS | Status: AC
  Administered 2022-02-21: 420 mg via INTRAVENOUS
  Filled 2022-02-21: qty 20

## 2022-02-21 MED ORDER — DIPHENHYDRAMINE HCL 25 MG PO CAPS
50.0000 mg | ORAL_CAPSULE | Freq: Once | ORAL | Status: AC
  Administered 2022-02-21: 50 mg via ORAL
  Filled 2022-02-21: qty 2

## 2022-02-21 MED ORDER — HEPARIN SOD (PORK) LOCK FLUSH 100 UNIT/ML IV SOLN
500.0000 [IU] | Freq: Once | INTRAVENOUS | Status: AC | PRN
  Administered 2022-02-21: 500 [IU]
  Filled 2022-02-21: qty 5

## 2022-02-21 MED ORDER — ACETAMINOPHEN 325 MG PO TABS
650.0000 mg | ORAL_TABLET | Freq: Once | ORAL | Status: AC
  Administered 2022-02-21: 650 mg via ORAL
  Filled 2022-02-21: qty 2

## 2022-02-21 NOTE — Patient Instructions (Signed)
[facility] AT [place]  Discharge Instructions: ?Thank you for choosing [facility] to provide your oncology and hematology care.  ?If you have a lab appointment with the [facility], please go directly to the [facility] and check in at the registration area. ? ?Wear comfortable clothing and clothing appropriate for easy access to any Portacath or PICC line.  ? ?We strive to give you quality time with your provider. You may need to reschedule your appointment if you arrive late (15 or more minutes).  Arriving late affects you and other patients whose appointments are after yours.  Also, if you miss three or more appointments without notifying the office, you may be dismissed from the clinic at the provider?s discretion.    ?  ?For prescription refill requests, have your pharmacy contact our office and allow 72 hours for refills to be completed.   ? ?Today you received the following chemotherapy and/or immunotherapy agents:  Herceptin / Perjeta  ?  ?To help prevent nausea and vomiting after your treatment, we encourage you to take your nausea medication as directed. ? ?BELOW ARE SYMPTOMS THAT SHOULD BE REPORTED IMMEDIATELY: ?*FEVER GREATER THAN 100.4 F (38 ?C) OR HIGHER ?*CHILLS OR SWEATING ?*NAUSEA AND VOMITING THAT IS NOT CONTROLLED WITH YOUR NAUSEA MEDICATION ?*UNUSUAL SHORTNESS OF BREATH ?*UNUSUAL BRUISING OR BLEEDING ?*URINARY PROBLEMS (pain or burning when urinating, or frequent urination) ?*BOWEL PROBLEMS (unusual diarrhea, constipation, pain near the anus) ?TENDERNESS IN MOUTH AND THROAT WITH OR WITHOUT PRESENCE OF ULCERS (sore throat, sores in mouth, or a toothache) ?UNUSUAL RASH, SWELLING OR PAIN  ?UNUSUAL VAGINAL DISCHARGE OR ITCHING  ? ?Items with * indicate a potential emergency and should be followed up as soon as possible or go to the Emergency Department if any problems should occur. ? ?Please show the CHEMOTHERAPY ALERT CARD or IMMUNOTHERAPY ALERT CARD at  check-in to the Emergency Department and triage nurse. ? ?Should you have questions after your visit or need to cancel or reschedule your appointment, please contact [facility] [place] [facility] [place]  [phone] and follow the prompts.  Office hours are 8:00 a.m. to 4:30 p.m. Monday - Friday. Please note that voicemails left after 4:00 p.m. may not be returned until the following business day.  We are closed weekends and major holidays. You have access to a nurse at all times for urgent questions. Please call the main number to the clinic [phone] and follow the prompts. ? ?For any non-urgent questions, you may also contact your provider using MyChart. We now offer e-Visits for anyone 29 and older to request care online for non-urgent symptoms. For details visit [email or web address]. ?  ?Also download the MyChart app! Go to the app store, search "MyChart", open the app, select [facility], and log in with your MyChart username and password. ? ?Due to Covid, a mask is required upon entering the hospital/clinic. If you do not have a mask, one will be given to you upon arrival. For doctor visits, patients may have 1 support person aged 78 or older with them. For treatment visits, patients cannot have anyone with them due to current Covid guidelines and our immunocompromised population.  ?

## 2022-02-22 ENCOUNTER — Encounter (HOSPITAL_BASED_OUTPATIENT_CLINIC_OR_DEPARTMENT_OTHER): Payer: Medicare Other | Admitting: Internal Medicine

## 2022-02-22 DIAGNOSIS — F411 Generalized anxiety disorder: Secondary | ICD-10-CM | POA: Diagnosis present

## 2022-02-22 DIAGNOSIS — G620 Drug-induced polyneuropathy: Secondary | ICD-10-CM | POA: Diagnosis present

## 2022-02-22 DIAGNOSIS — F1120 Opioid dependence, uncomplicated: Secondary | ICD-10-CM | POA: Insufficient documentation

## 2022-02-22 DIAGNOSIS — L97822 Non-pressure chronic ulcer of other part of left lower leg with fat layer exposed: Secondary | ICD-10-CM

## 2022-02-22 DIAGNOSIS — Z862 Personal history of diseases of the blood and blood-forming organs and certain disorders involving the immune mechanism: Secondary | ICD-10-CM | POA: Insufficient documentation

## 2022-02-22 DIAGNOSIS — Z86718 Personal history of other venous thrombosis and embolism: Secondary | ICD-10-CM

## 2022-02-22 DIAGNOSIS — T451X5A Adverse effect of antineoplastic and immunosuppressive drugs, initial encounter: Secondary | ICD-10-CM | POA: Diagnosis present

## 2022-02-22 DIAGNOSIS — S91101A Unspecified open wound of right great toe without damage to nail, initial encounter: Secondary | ICD-10-CM | POA: Diagnosis not present

## 2022-02-22 NOTE — Progress Notes (Signed)
Laura Santiago ([id number]) ?Visit Report for 02/22/2022 ?Chief Complaint Document Details ?Patient Name: Laura Santiago, Laura Santiago. ?Date of Service: 02/22/2022 11:15 AM ?Medical Record Number: [id number] ?Patient Account Number: [id number] ?Date of Birth/Sex: [birth year] (76 y.o. F) ?Treating RN: Levora Dredge ?Primary Care Provider: HOUSECALLS, DOCTORS Other Clinician: ?Referring Provider: HOUSECALLS, DOCTORS ?Treating Provider/Extender: Kalman Shan ?Weeks in Treatment: 33 ?Information Obtained from: Patient ?Chief Complaint ?Left lower extremity wound ?Right toe wounds ?Left upper lateral thigh wounds ?Electronic Signature(s) ?Signed: 02/22/2022 12:02:09 PM By: Kalman Shan DO ?Entered By: Kalman Shan on 02/22/2022 11:52:27 ?ANNABELLA, ELFORD. ([id number]) ?-------------------------------------------------------------------------------- ?Debridement Details ?Patient Name: Laura Santiago. ?Date of Service: 02/22/2022 11:15 AM ?Medical Record Number: [id number] ?Patient Account Number: [id number] ?Date of Birth/Sex: 06/07/1946 (76 y.o. F) ?Treating RN: Levora Dredge ?Primary Care Provider: HOUSECALLS, DOCTORS Other Clinician: ?Referring Provider: HOUSECALLS, DOCTORS ?Treating Provider/Extender: Kalman Shan ?Weeks in Treatment: 33 ?Debridement Performed for ?Wound #1 Left,Medial Lower Leg ?Assessment: ?Performed By: Physician Kalman Shan, MD ?Debridement Type: Debridement ?Severity of Tissue Pre Debridement: Fat layer exposed ?Level of Consciousness (Pre- ?Awake and Alert ?procedure): ?Pre-procedure Verification/Time Out ?Yes - 11:41 ?Taken: ?Total Area Debrided (L x W): 10.2 (cm) x 10.5 (cm) = 107.1 (cm?) ?Tissue and other material ?Viable, Non-Viable, Slough, Subcutaneous, [place] ?debrided: ?Level: Skin/Subcutaneous Tissue ?Debridement Description: Excisional ?Instrument: Curette ?Bleeding: Moderate ?Hemostasis Achieved: Pressure ?Response to Treatment: Procedure was tolerated well ?Level of  Consciousness (Post- ?Awake and Alert ?procedure): ?Post Debridement Measurements of Total Wound ?Length: (cm) 10.2 ?Width: (cm) 10.5 ?Depth: (cm) 0.1 ?Volume: (cm?) 8.412 ?Character of Wound/Ulcer Post Debridement: Stable ?Severity of Tissue Post Debridement: Fat layer exposed ?Post Procedure Diagnosis ?Same as Pre-procedure ?Electronic Signature(s) ?Signed: 02/22/2022 12:02:09 PM By: Kalman Shan DO ?Signed: 02/22/2022 3:41:20 PM By: Levora Dredge ?Entered By: Levora Dredge on 02/22/2022 11:45:32 ?ADY, HEIMANN. ([id number]) ?-------------------------------------------------------------------------------- ?HPI Details ?Patient Name: Laura Santiago. ?Date of Service: 02/22/2022 11:15 AM ?Medical Record Number: [id number] ?Patient Account Number: [id number] ?Date of Birth/Sex: [birth year] (76 y.o. F) ?Treating RN: Levora Dredge ?Primary Care Provider: HOUSECALLS, DOCTORS Other Clinician: ?Referring Provider: HOUSECALLS, DOCTORS ?Treating Provider/Extender: Kalman Shan ?Weeks in Treatment: 33 ?History of Present Illness ?HPI Description: Admission 7/27 ?Ms. Laura Santiago is a 76 year old female with a past medical history of ADHD, metastatic breast cancer, stage IV chronic kidney disease, ?history of DVT on Xarelto and chronic venous insufficiency that presents to the clinic for a chronic left lower extremity wound. She recently moved ?to [facility] 4 days ago. She was being followed by wound care center in [name]. She reports a 10-year history of wounds to her left ?lower extremity that eventually do heal with debridement and compression therapy. She states that the current wound reopened 4 months ago ?and she is using Vaseline and Coban. She denies signs of infection. ?8/3; patient presents for 1 week follow-up. She reports no issues or complaints today. She states she had vascular studies done in the last week. ?She denies signs of infection. She brought her little service dog with her  today. ?8/17; patient presents for follow-up. She has missed her last clinic appointment. She states she took the wrap off and attempted to rewrap her ?leg. She is having difficulty with transportation. She has her service dog with her today. Overall she feels well and reports improvement in wound ?healing. She denies signs of infection. She reports owning an old Velcro wrap compression and has this at her living facility ?9/14; patient presents for follow-up. Patient states that over the past 2 to  3 weeks she developed toe wounds to her right foot. She attributes this ?to tight fitting shoes. She subsequently developed cellulitis in the right leg and has been treated by doxycycline by her oncologist. She reports ?improvement in symptoms however continues to have some redness and swelling to this leg. ?To the left lower extremity patient has been having her wraps changed with home health twice weekly. She states that the [facility] is not ?helping control the drainage. Other than that she has no issues or complaints today. She denies signs of infection to the left lower extremity. ?9/21; patient presents for follow-up. She reports seeing infectious disease for her cellulitis. She reports no further management. She has home ?health that changes the wraps twice weekly. She has no issues or complaints today. She denies signs of infection. ?10/5; patient presents for follow-up. She has no issues or complaints today. She denies signs of infection. She states that the right great toe has ?not been dressed by home health. ?10/12; patient presents for follow-up. She has no issues or complaints today. She reports improvement in her wound healing. She has been using ?silver alginate to the right great toe wound. She denies signs of infection. ?10/26; patient presents for follow-up. Home health did not have sorbact so they continued to use Hydrofera Blue under the wrap. She has been ?using silver alginate to the great toe  wound however she did not have a dressing in place today. She currently denies signs of infection. ?11/2; patient presents for follow-up. She has been using sorb act under the compression wrap. She reports using silver alginate to the toe wound ?again she does not have a dressing in place. She currently denies signs of infection. ?11/23; patient presents for follow-up. Unfortunately she has missed her last 2 clinic appointments. She was last seen 3 weeks ago. She did her ?own compression wrap with Kerlix and Coban yesterday after seeing vein and vascular. She has not been dressing her right great toe wound. She ?currently denies signs of infection. ?11/30; patient presents for 1 week follow-up. She states she changed her dressing last week prior to home health and use sorb act with Dakin's ?and Hydrofera Blue. Home health has changed the dressing as well and they have been using sorbact. Today she reports increased redness to her ?right lower extremity. She has a history of cellulitis to this leg. She has been using silver alginate to the right great toe. Unfortunately she had an ?episode of diarrhea prior to coming in and had feces all over the right leg and to the wrap of her left leg. ?12/7; patient presents for 1 week follow-up. She states that home health did not come out to change the dressing and she took it off yesterday. It ?is unclear if she is dressing the right toe wound. She denies signs of infection. ?12/14; patient presents for 1 week follow-up. She has no issues or complaints today. ?12/21; patient presents for follow-up. She has no issues or complaints today. She denies signs of infection. ?12/28/2021; patient presents for follow-up. She was hospitalized for sepsis secondary to right lower extremity cellulitis On 12/23. She states she is ?currently at a SNF. She states that she was started on doxycycline this morning for her right great toe swelling and redness. She is not sure what ?dressings have  been done to her left lower extremity for the past 3 weeks. She says its been mainly gauze with an Ace wrap. ?1/25; patient presents for follow-up. She is  still residing in a skilled nursing facility. She rep

## 2022-02-22 NOTE — Progress Notes (Signed)
Laura Santiago ([id number]) ?Visit Report for 02/22/2022 ?Arrival Information Details ?Patient Name: Laura Santiago, Laura Santiago. ?Date of Service: 02/22/2022 11:15 AM ?Medical Record Number: [id number] ?Patient Account Number: [id number] ?Date of Birth/Sex: 07/15/1946 (76 y.o. F) ?Treating RN: Levora Dredge ?Primary Care Provider: HOUSECALLS, DOCTORS Other Clinician: ?Referring Provider: HOUSECALLS, DOCTORS ?Treating Provider/Extender: Kalman Shan ?Weeks in Treatment: 33 ?Visit Information History Since Last Visit ?Added or deleted any medications: No ?Patient Arrived: [name] ?Any new allergies or adverse reactions: No ?Arrival Time: 11:20 ?Had a fall or experienced change in No ?Accompanied By: self ?activities of daily living that may affect ?Transfer Assistance: None ?risk of falls: ?Patient Identification Verified: Yes ?Hospitalized since last visit: No ?Secondary Verification Process Completed: Yes ?Has Dressing in Place as Prescribed: Yes ?Patient Has Alerts: Yes ?Pain Present Now: Yes ?Patient Alerts: PT HAS SERVICE ANIMAL ?ABI 07/11/21 ?R) 1.16 L) 1.27 ?Electronic Signature(s) ?Signed: 02/22/2022 3:41:20 PM By: Levora Dredge ?Entered By: Levora Dredge on 02/22/2022 11:20:46 ?RYLINN, LINZY. ([id number]) ?-------------------------------------------------------------------------------- ?Clinic Level of Care Assessment Details ?Patient Name: Laura Santiago. ?Date of Service: 02/22/2022 11:15 AM ?Medical Record Number: [id number] ?Patient Account Number: [id number] ?Date of Birth/Sex: 25-Jul-1946 (76 y.o. F) ?Treating RN: Levora Dredge ?Primary Care Provider: HOUSECALLS, DOCTORS Other Clinician: ?Referring Provider: HOUSECALLS, DOCTORS ?Treating Provider/Extender: Kalman Shan ?Weeks in Treatment: 33 ?Clinic Level of Care Assessment Items ?TOOL 1 Quantity Score ?[] - Use when EandM and Procedure is performed on INITIAL visit 0 ?ASSESSMENTS - Nursing Assessment / Reassessment ?[] - General Physical Exam  (combine w/ comprehensive assessment (listed just below) when performed on new ?0 ?pt. evals) ?[] - 0 ?Comprehensive Assessment (HX, ROS, Risk Assessments, Wounds Hx, etc.) ?ASSESSMENTS - Wound and Skin Assessment / Reassessment ?[] - Dermatologic / Skin Assessment (not related to wound area) 0 ?ASSESSMENTS - Ostomy and/or Continence Assessment and Care ?[] - Incontinence Assessment and Management 0 ?[] - 0 ?Ostomy Care Assessment and Management (repouching, etc.) ?PROCESS - Coordination of Care ?[] - Simple Patient / Family Education for ongoing care 0 ?[] - 0 ?Complex (extensive) Patient / Family Education for ongoing care ?[] - 0 ?Staff obtains Consents, Records, Test Results / Process Orders ?[] - 0 ?Staff telephones [place], Nursing Homes / Clarify orders / etc ?[] - 0 ?Routine Transfer to another Facility (non-emergent condition) ?[] - 0 ?Routine Hospital Admission (non-emergent condition) ?[] - 0 ?New Admissions / Biomedical engineer / Ordering NPWT, Apligraf, etc. ?[] - 0 ?Emergency Hospital Admission (emergent condition) ?PROCESS - Special Needs ?[] - Pediatric / Minor Patient Management 0 ?[] - 0 ?Isolation Patient Management ?[] - 0 ?Hearing / Language / Visual special needs ?[] - 0 ?Assessment of Community assistance (transportation, D/C planning, etc.) ?[] - 0 ?Additional assistance / Altered mentation ?[] - 0 ?Support Surface(s) Assessment (bed, cushion, seat, etc.) ?INTERVENTIONS - Miscellaneous ?[] - External ear exam 0 ?[] - 0 ?Patient Transfer (multiple staff / Civil Service fast streamer / Similar devices) ?[] - 0 ?Simple Staple / Suture removal (25 or less) ?[] - 0 ?Complex Staple / Suture removal (26 or more) ?[] - 0 ?Hypo/Hyperglycemic Management (do not check if billed separately) ?[] - 0 ?Ankle / Brachial Index (ABI) - do not check if billed separately ?Has the patient been seen at the hospital within the last three years: Yes ?Total Score: 0 ?Level Of Care: ____ ?OKSANA, DEBERRY ([id number]) ?Electronic  Signature(s) ?Signed: 02/22/2022 3:41:20 PM By: Levora Dredge ?Entered By: Levora Dredge on 02/22/2022 11:49:05 ?COLBI, SCHILTZ. ([id number]) ?-------------------------------------------------------------------------------- ?Lower Extremity  Assessment Details ?Patient Name: Laura Santiago. ?Date of Service: 02/22/2022 11:15 AM ?Medical Record Number: [id number] ?Patient Account Number: [id number] ?Date of Birth/Sex: 1946/11/04 (76 y.o. F) ?Treating RN: Levora Dredge ?Primary Care Provider: HOUSECALLS, DOCTORS Other Clinician: ?Referring Provider: HOUSECALLS, DOCTORS ?Treating Provider/Extender: Kalman Shan ?Weeks in Treatment: 33 ?Edema Assessment ?Assessed: [Left: No] [Right: No] ?Edema: [Left: Ye] [Right: s] ?Calf ?Left: Right: ?Point of Measurement: 29 cm From Medial Instep 35 cm ?Ankle ?Left: Right: ?Point of Measurement: 10 cm From Medial Instep 23.2 cm ?Vascular Assessment ?Pulses: ?Dorsalis Pedis ?Palpable: [Left:Yes] ?Electronic Signature(s) ?Signed: 02/22/2022 3:41:20 PM By: Levora Dredge ?Entered By: Levora Dredge on 02/22/2022 11:36:51 ?ANNELLE, BEHRENDT. ([id number]) ?-------------------------------------------------------------------------------- ?Multi Wound Chart Details ?Patient Name: Laura Santiago. ?Date of Service: 02/22/2022 11:15 AM ?Medical Record Number: [id number] ?Patient Account Number: [id number] ?Date of Birth/Sex: 10-27-1946 (76 y.o. F) ?Treating RN: Levora Dredge ?Primary Care Provider: HOUSECALLS, DOCTORS Other Clinician: ?Referring Provider: HOUSECALLS, DOCTORS ?Treating Provider/Extender: Kalman Shan ?Weeks in Treatment: 33 ?Vital Signs ?Height(in): 66 ?Pulse(bpm): 83 ?Weight(lbs): 153 ?Blood Pressure(mmHg): 125/59 ?Body Mass Index(BMI): 24.7 ?Temperature(??F): 98.4 ?Respiratory Rate(breaths/min): 18 ?Photos: [N/A:N/A] ?Wound Location: Left, Medial Lower Leg N/A N/A ?Wounding Event: Gradually Appeared N/A N/A ?Primary Etiology: Venous Leg Ulcer N/A N/A ?Comorbid  History: Hypertension, Osteoarthritis, N/A N/A ?Received Chemotherapy, Received ?Radiation ?Date Acquired: 04/06/2021 N/A N/A ?Weeks of Treatment: 72 N/A N/A ?Wound Status: Open N/A N/A ?Wound Recurrence: No N/A N/A ?Clustered Wound: Yes N/A N/A ?Measurements L x W x D (cm) 10.2x10.5x0.1 N/A N/A ?Area (cm?) : 84.116 N/A N/A ?Volume (cm?) : 8.412 N/A N/A ?% Reduction in Area: 6.90% N/A N/A ?% Reduction in Volume: 53.40% N/A N/A ?Classification: Full Thickness Without Exposed N/A N/A ?Support Structures ?Exudate Amount: Large N/A N/A ?Exudate Type: Serosanguineous N/A N/A ?Exudate Color: red, brown N/A N/A ?Wound Margin: Flat and Intact N/A N/A ?Granulation Amount: Small (1-33%) N/A N/A ?Granulation Quality: Red, Pink N/A N/A ?Necrotic Amount: Large (67-100%) N/A N/A ?Exposed Structures: ?Fat Layer (Subcutaneous Tissue): N/A N/A ?Yes ?Fascia: No ?Tendon: No ?Muscle: No ?Joint: No ?Bone: No ?Epithelialization: Small (1-33%) N/A N/A ?Treatment Notes ?Electronic Signature(s) ?Signed: 02/22/2022 3:41:20 PM By: Levora Dredge ?Entered By: Levora Dredge on 02/22/2022 11:39:22 ?JANNETTE, COTHAM ([id number]) ?CORALINE, TALWAR. ([id number]) ?-------------------------------------------------------------------------------- ?Multi-Disciplinary Care Plan Details ?Patient Name: MALAYLA, GRANBERRY. ?Date of Service: 02/22/2022 11:15 AM ?Medical Record Number: [id number] ?Patient Account Number: [id number] ?Date of Birth/Sex: 01-22-1946 (76 y.o. F) ?Treating RN: Levora Dredge ?Primary Care Provider: HOUSECALLS, DOCTORS Other Clinician: ?Referring Provider: HOUSECALLS, DOCTORS ?Treating Provider/Extender: Kalman Shan ?Weeks in Treatment: 33 ?Active Inactive ?Wound/Skin Impairment ?Nursing Diagnoses: ?Impaired tissue integrity ?Goals: ?Patient/caregiver will verbalize understanding of skin care regimen ?Date Initiated: 07/06/2021 ?Date Inactivated: 07/27/2021 ?Target Resolution Date: 07/06/2021 ?Goal Status: Met ?Ulcer/skin breakdown  will have a volume reduction of 30% by week 4 ?Date Initiated: 07/06/2021 ?Date Inactivated: 10/12/2021 ?Target Resolution Date: 08/06/2021 ?Goal Status: Unmet ?Unmet Reason: cont tx ?Ulcer/skin breakdown will ha

## 2022-02-23 ENCOUNTER — Telehealth: Payer: Self-pay | Admitting: *Deleted

## 2022-02-23 NOTE — Telephone Encounter (Signed)
Received message that patient is in a lot of pain and would like to be seen as soon as possible tomorrow morning. Please advise ?

## 2022-02-24 ENCOUNTER — Telehealth: Payer: Self-pay | Admitting: *Deleted

## 2022-02-24 ENCOUNTER — Inpatient Hospital Stay (HOSPITAL_BASED_OUTPATIENT_CLINIC_OR_DEPARTMENT_OTHER): Payer: Medicare Other | Admitting: Hospice and Palliative Medicine

## 2022-02-24 ENCOUNTER — Ambulatory Visit
Admission: RE | Admit: 2022-02-24 | Discharge: 2022-02-24 | Disposition: A | Discharge status: Home / Self Care | Payer: Medicare Other | Source: Ambulatory Visit | Attending: Medical Oncology | Admitting: Medical Oncology

## 2022-02-24 ENCOUNTER — Encounter: Payer: Self-pay | Admitting: Hospice and Palliative Medicine

## 2022-02-24 ENCOUNTER — Other Ambulatory Visit: Payer: Self-pay | Admitting: Medical Oncology

## 2022-02-24 ENCOUNTER — Other Ambulatory Visit: Payer: Self-pay

## 2022-02-24 ENCOUNTER — Encounter: Payer: Self-pay | Admitting: Oncology

## 2022-02-24 ENCOUNTER — Ambulatory Visit
Admission: RE | Admit: 2022-02-24 | Discharge: 2022-02-24 | Disposition: A | Discharge status: Home / Self Care | Payer: Medicare Other | Attending: Neurosurgery | Admitting: Neurosurgery

## 2022-02-24 VITALS — BP 130/96 | HR 89 | Temp 99.5°F | Resp 16 | Ht 62.0 in | Wt 146.0 lb

## 2022-02-24 DIAGNOSIS — M549 Dorsalgia, unspecified: Secondary | ICD-10-CM

## 2022-02-24 DIAGNOSIS — M545 Low back pain, unspecified: Secondary | ICD-10-CM

## 2022-02-24 DIAGNOSIS — G893 Neoplasm related pain (acute) (chronic): Secondary | ICD-10-CM | POA: Diagnosis not present

## 2022-02-24 DIAGNOSIS — Z5111 Encounter for antineoplastic chemotherapy: Secondary | ICD-10-CM | POA: Diagnosis not present

## 2022-02-24 MED ORDER — OXYCODONE HCL 10 MG PO TABS: 10.0000 mg | ORAL_TABLET | ORAL | 0 refills | Status: DC | PRN

## 2022-02-24 MED ORDER — FENTANYL 12 MCG/HR TD PT72: 1.0000 | MEDICATED_PATCH | TRANSDERMAL | 0 refills | Status: DC

## 2022-02-24 NOTE — Telephone Encounter (Signed)
Apparently the pt called late yest. I called her today and she did not answer. Then I called again and she did answer and says she was in pain and needs help. She does have pain med.  And took some. She does not know what to do she says. I called [name] her daughter and she will bring her here today . [name] said that her nother is needing more care and looking into skilled facility. I told her that with Medicare pt. They have to have 3 days stay for 1 thing, then what kind of needs would determine if she meets criteria for skilled care. She wanted to know if she can pick the place she wants her to go. I told her that she would speak to social worker and tell them your request. However they usually send it to all SNF within so many miles and they will try to get the one you want but if it is time for d/c of hospital and she has an offer they usually want you to take that one. She should call her insurance and see what the rules are to get in skilled facility. She will do that and casey is bringing her today ?

## 2022-02-24 NOTE — Telephone Encounter (Signed)
Appointment has been scheduled for 2 PM by Bethann Humble ?

## 2022-02-24 NOTE — Progress Notes (Signed)
States having lumbar back pain that started back up 2 days ago. States this is the same pain she had prior to starting chemo. Has been taking oxycodone and fentanyl patch. PCP states she needs to come off of fentanyl patch.  ?

## 2022-02-24 NOTE — Progress Notes (Signed)
? ?[facility] ?[place] [facility] ?[phone] [phone] ? ?Patient Care Team: ?[facility] as PCP - General (Geriatric Medicine) ?[name], MD as Consulting Physician (Hematology and Oncology)  ? ?Name of the patient: Laura Santiago  ?[id number]  ?June 06, 1946  ? ?Date of visit: 02/24/22 ? ?Reason for Consult: ?Laura Santiago is a 76 y.o. 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  ? ?Patient has known widespread skeletal metastases.  She has previous RT to hip.  Patient has history of pathologic spinal fractures. ? ?Patient presents to [facility] today for evaluation of new onset mid back pain, which she says has been more acute over the past two days.  Patient restarted her transdermal fentanyl yesterday.  She has self liberalized her oxycodone, taking two oxycodone (35m) twice daily and says that that has significantly improved her pain.  She currently denies pain in clinic. ? ?She denies any changes to neuropathy, weakness, or changes to bowel or bladder. ? ?Denies any neurologic complaints. Denies recent fevers or illnesses. Denies any easy bleeding or bruising. Reports good appetite and denies weight loss. Denies chest pain. Denies any nausea, vomiting, constipation, or diarrhea. Denies urinary complaints. Patient offers no further specific complaints today. ? ?PAST MEDICAL HISTORY: ?Past Medical History:  ?Diagnosis Date  ? ADHD (attention deficit hyperactivity disorder)   ? [place]  ? Anemia   ? IDA 11/26/2019, Anemia in stage 4 chronic kidney disease ([place] 09/03/2020  ? Arthritis   ? osteoarthritis right knee 09/30/2014  ? Breast cancer ([place] 10/05/2013  ? [place] Her 2 is 3+  ? Cancer related pain 03/28/2021  ? [place]  ? cervical compression fracture 03/18/2021  ? [place] ? DVT of lower  extremity, bilateral ([place] 03/30/2014  ? [place] ? Generalized muscle weakness 03/31/2016  ? [place] ? GERD (gastroesophageal reflux disease) 05/15/2013  ? [place] ? [place]roidism, secondary ([place] 04/25/2018  ? [place] ? Hypertension 02/20/2016  ? info from [place] ? Memory loss 02/05/2015  ? [place] ? Metabolic acidosis 044/81/8563 ? [place] Metabolic syndrome 014/97/0263 ? [place] ? Osteopenia after menopause 03/29/2016  ? [place] ? 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 [place] ? Stasis ulcer of left lower extremity ([place] 03/29/2016  ? [place] ? ? ?PAST SURGICAL HISTORY:  ?Past Surgical History:  ?Procedure Laterality Date  ? CESAREAN SECTION    ? unknown  ? fibroid removed  N/A   ? [place]  ? IR FLUORO GUIDE CV LINE LEFT  07/27/2021  ? IR PORT REPAIR CENTRAL VENOUS ACCESS DEVICE Left   ? [place] ? MOHS SURGERY N/A 02/25/2014  ? [place] ? ovary removed     ? unknown  ? PORTA CATH INSERTION N/A 02/13/2022  ? Procedure: PORTA CATH INSERTION;  Surgeon: DAlgernon Huxley MD;  [facility];  Service: Cardiovascular;  Laterality: N/A;  ? [place] EXTRAC[place] LENS Bilateral  Bilateral 09/05/2012  ? [place]   ? unknown  ?  LUMPECTOMY Right 04/03/2014  ? [place]  ? ? ?HEMATOLOGY/ONCOLOGY HISTORY:  ?Oncology History  ?Metastatic breast  cancer [facility])  ?2014 Initial Diagnosis  ? history of breast cancer diagnosed in 2014 s/p chemotherapy with Herceptin and lumpectomy, radiation and antiestrogen therapy that was completed in 2020 (in [name]) ?  ?03/18/2021 - 03/24/2021 Hospital Admission  ? New neck and shoulder pain: Scans revealed lytic lesions C5-C6 with pathological compression fractures (developed steroid-induced mania) status post 1 round of radiation (Dr. Sondra Come in [place] was her medical oncologist) ?  ?04/19/2021 Relapse/Recurrence  ? Biopsy of L1 vertebral body on 04/19/21 showed metastatic carcinoma consistent with breast origin involving  bone: ER 10%, PR 0%, HER2 ratio 5.1) ?  ?05/06/2021 -  Chemotherapy  ? Taxol (weekly) Herceptin Perjeta palliative chemotherapy started in [name], moved to be closer to her family in [place] ? ?  ? ?  ?06/28/2021 Cancer Staging  ? Staging form: Breast, AJCC 8th Edition ?- Clinical: Stage IV (pM1, ER+, PR-, HER2+) - Signed by [name], MD on 06/28/2021 ? ?  ?07/01/2021 -  Chemotherapy  ? Patient is on Treatment Plan : BREAST Weekly Paclitaxel + Trastuzumab + Pertuzumab q21d x 8 cycles / Trastuzumab + Pertuzumab q21d x 4 cycles  ?   ? ? ?ALLERGIES:  is allergic to corticosteroids, sulfa antibiotics, and celebrex [celecoxib]. ? ?MEDICATIONS:  ?Current Outpatient Medications  ?Medication Sig Dispense Refill  ? amphetamine-dextroamphetamine (ADDERALL XR) 30 MG 24 hr capsule Take 1 capsule (30 mg total) by mouth daily. 15 capsule 0  ? Calcium 200 MG TABS Take 1 tablet by mouth daily.    ? fentaNYL (DURAGESIC) 12 MCG/HR Place 1 patch onto the skin every 3 (three) days. 1 patch to skin. Next dose on 12/10/2021 at 10:40 pm 3 patch 0  ? gabapentin (NEURONTIN) 400 MG capsule Take 200 mg by mouth 2 (two) times daily.    ? hydrOXYzine (VISTARIL) 25 MG capsule Take 50 mg by mouth every 8 (eight) hours as needed.    ? lisinopril (ZESTRIL) 20 MG tablet Take 20 mg by mouth daily.    ? LORazepam (ATIVAN) 0.5 MG tablet 0.5 mg every 4 (four) hours as needed.    ? Multiple Vitamins-Minerals (MULTIVITAMIN ADULTS) TABS Take 1 tablet by mouth daily.    ? ondansetron (ZOFRAN) 8 MG tablet     ? Oxycodone HCl 10 MG TABS Take 1 tablet (10 mg total) by mouth every 4 (four) hours as needed. 90 tablet 0  ? rivaroxaban (XARELTO) 20 MG TABS tablet Take 1 tablet (20 mg total) by mouth daily with supper. 30 tablet 3  ? sodium hypochlorite (DAKIN'S 1/4 STRENGTH) 0.125 % SOLN Apply 1 application topically as directed. Moisten gauze with solution and wrap wound    ? terbinafine (LAMISIL AT) 1 % cream Apply 1 application topically 2 (two) times daily.  30 g 5  ? acetaminophen (TYLENOL) 500 MG tablet Take 500-1,000 mg by mouth every 6 (six) hours as needed for mild pain or moderate pain. (Patient not taking: Reported on 01/20/2022)    ? zolpidem (AMBIEN) 5 MG tablet  (Patient not taking: Reported on 02/24/2022)    ? ?No current facility-administered medications for this visit.  ? ?Facility-Administered Medications Ordered in Other Visits  ?Medication Dose Route Frequency Provider Last Rate Last Admin  ? heparin lock flush 100 UNIT/ML injection           ? sodium chloride flush (NS) 0.9 % injection 10 mL  10 mL Intracatheter PRN [name], MD      ? ? ?VITAL SIGNS: ?BP (!) 130/96 (  BP Location: Left Arm, Patient Position: Sitting, Cuff Size: Normal)   Pulse 89   Temp 99.5 ?F (37.5 ?C) (Oral)   Resp 16   Ht 5' 2" (1.575 m)   Wt 146 lb (66.2 kg)   SpO2 100%   BMI 26.70 kg/m?  ?Filed Weights  ? 02/24/22 1417  ?Weight: 146 lb (66.2 kg)  ?  ?Estimated body mass index is 26.7 kg/m? as calculated from the following: ?  Height as of this encounter: 5' 2" (1.575 m). ?  Weight as of this encounter: 146 lb (66.2 kg). ? ?LABS: ?CBC: ?   ?Component Value Date/Time  ? WBC 5.3 01/31/2022 1256  ? HGB 9.9 (L) 01/31/2022 1256  ? HCT 33.1 (L) 01/31/2022 1256  ? PLT 286 01/31/2022 1256  ? MCV 92.5 01/31/2022 1256  ? NEUTROABS 4.3 01/31/2022 1256  ? LYMPHSABS 0.6 (L) 01/31/2022 1256  ? MONOABS 0.3 01/31/2022 1256  ? EOSABS 0.1 01/31/2022 1256  ? BASOSABS 0.0 01/31/2022 1256  ? ?Comprehensive Metabolic Panel: ?   ?Component Value Date/Time  ? NA 138 01/31/2022 1256  ? K 3.7 01/31/2022 1256  ? CL 106 01/31/2022 1256  ? CO2 25 01/31/2022 1256  ? BUN 22 01/31/2022 1256  ? CREATININE 1.35 (H) 01/31/2022 1256  ? GLUCOSE 117 (H) 01/31/2022 1256  ? CALCIUM 8.4 (L) 01/31/2022 1256  ? AST 18 01/31/2022 1256  ? ALT 10 01/31/2022 1256  ? ALKPHOS 56 01/31/2022 1256  ? BILITOT 0.4 01/31/2022 1256  ? PROT 6.7 01/31/2022 1256  ? ALBUMIN 3.3 (L) 01/31/2022 1256  ? ? ?RADIOGRAPHIC STUDIES: ?CT  CHEST ABDOMEN PELVIS W CONTRAST ? ?Result Date: 01/30/2022 ?CLINICAL DATA:  Metastatic breast cancer, osseous metastatic disease, assess treatment response EXAM: CT CHEST, ABDOMEN, AND PELVIS WITH CONTRAST TEC

## 2022-03-01 ENCOUNTER — Encounter: Payer: Medicare Other | Admitting: Internal Medicine

## 2022-03-02 ENCOUNTER — Emergency Department
Admission: EM | Admit: 2022-03-02 | Discharge: 2022-03-02 | Disposition: A | Discharge status: Home / Self Care | Payer: Medicare Other | Attending: Emergency Medicine | Admitting: Emergency Medicine

## 2022-03-02 ENCOUNTER — Ambulatory Visit: Payer: Medicare Other | Admitting: Dermatology

## 2022-03-02 ENCOUNTER — Other Ambulatory Visit: Payer: Self-pay

## 2022-03-02 ENCOUNTER — Encounter: Payer: Self-pay | Admitting: Intensive Care

## 2022-03-02 DIAGNOSIS — D649 Anemia, unspecified: Secondary | ICD-10-CM | POA: Diagnosis not present

## 2022-03-02 DIAGNOSIS — Z20822 Contact with and (suspected) exposure to covid-19: Secondary | ICD-10-CM | POA: Insufficient documentation

## 2022-03-02 DIAGNOSIS — R531 Weakness: Secondary | ICD-10-CM | POA: Insufficient documentation

## 2022-03-02 DIAGNOSIS — R2689 Other abnormalities of gait and mobility: Secondary | ICD-10-CM | POA: Diagnosis not present

## 2022-03-02 DIAGNOSIS — M6281 Muscle weakness (generalized): Secondary | ICD-10-CM | POA: Diagnosis not present

## 2022-03-02 DIAGNOSIS — Z853 Personal history of malignant neoplasm of breast: Secondary | ICD-10-CM | POA: Diagnosis not present

## 2022-03-02 LAB — CBC
HCT: 29.7 % — ABNORMAL LOW (ref 36.0–46.0)
Hemoglobin: 8.8 g/dL — ABNORMAL LOW (ref 12.0–15.0)
MCH: 26.3 pg (ref 26.0–34.0)
MCHC: 29.6 g/dL — ABNORMAL LOW (ref 30.0–36.0)
MCV: 88.9 fL (ref 80.0–100.0)
Platelets: 310 10*3/uL (ref 150–400)
RBC: 3.34 MIL/uL — ABNORMAL LOW (ref 3.87–5.11)
RDW: 16.5 % — ABNORMAL HIGH (ref 11.5–15.5)
WBC: 9.2 10*3/uL (ref 4.0–10.5)
nRBC: 0 % (ref 0.0–0.2)

## 2022-03-02 LAB — URINALYSIS, COMPLETE (UACMP) WITH MICROSCOPIC
Bacteria, UA: NONE SEEN
Bilirubin Urine: NEGATIVE
Glucose, UA: NEGATIVE mg/dL
Hgb urine dipstick: NEGATIVE
Ketones, ur: NEGATIVE mg/dL
Leukocytes,Ua: NEGATIVE
Nitrite: NEGATIVE
Protein, ur: 30 mg/dL — AB
Specific Gravity, Urine: 1.015 (ref 1.005–1.030)
pH: 7 (ref 5.0–8.0)

## 2022-03-02 LAB — COMPREHENSIVE METABOLIC PANEL
ALT: 10 U/L (ref 0–44)
AST: 18 U/L (ref 15–41)
Albumin: 3 g/dL — ABNORMAL LOW (ref 3.5–5.0)
Alkaline Phosphatase: 61 U/L (ref 38–126)
Anion gap: 8 (ref 5–15)
BUN: 24 mg/dL — ABNORMAL HIGH (ref 8–23)
CO2: 28 mmol/L (ref 22–32)
Calcium: 8.7 mg/dL — ABNORMAL LOW (ref 8.9–10.3)
Chloride: 104 mmol/L (ref 98–111)
Creatinine, Ser: 1.51 mg/dL — ABNORMAL HIGH (ref 0.44–1.00)
GFR, Estimated: 35 mL/min — ABNORMAL LOW (ref >60)
Glucose, Bld: 142 mg/dL — ABNORMAL HIGH (ref 70–99)
Potassium: 4.7 mmol/L (ref 3.5–5.1)
Sodium: 140 mmol/L (ref 135–145)
Total Bilirubin: 0.6 mg/dL (ref 0.3–1.2)
Total Protein: 7.7 g/dL (ref 6.5–8.1)

## 2022-03-02 LAB — RESP PANEL BY RT-PCR (FLU A&B, COVID) ARPGX2
Influenza A by PCR: NEGATIVE
Influenza B by PCR: NEGATIVE
SARS Coronavirus 2 by RT PCR: NEGATIVE

## 2022-03-02 NOTE — Evaluation (Signed)
Physical Therapy Evaluation ?Patient Details ?Name: Laura Santiago ?MRN: [id number] ?DOB: 10/06/1946 ?Today's Date: 03/02/2022 ? ?History of Present Illness ? Pt is a 76 y.o. female presenting to ED 3/23 with c/o generalized weakness; last chemotherapy 2 weeks ago.  PMH includes stage IV breast CA metastatic to bone; h/o pathologic spinal fx's, ADHD, anemia, B LE DVT, memory loss, stasis ulcer of L LE, and h/o MOHS surgery.  ?Clinical Impression ? Prior to ED visit, pt was modified independent ambulating with rollator; lives alone at [facility]; has meals brought to her; has linen and housekeeping assist 1x/week.  Currently pt is modified independent with bed mobility, transfers, and ambulation 30 feet with rollator.  Pt initially quick to start getting OOB for a walk but then suddenly reporting that she couldn't walk without her pain meds; pt then appearing impulsive and trying to walk with rollator to find her nurse in order to discuss pain meds--therapist called pt's nurse regarding pain meds and pt walked back to her ED stretcher bed to lay down.  Pt then appearing upset d/t reporting that therapist would recommend SNF d/t her not having her pain meds and not being able to walk as well as she normally can.  Therapist educated pt that even with pain, pt was able to ambulate safely with rollator and that it is also good to know that pt can ambulate safely despite pain (pt appearing with improved mood after this discussion).  Pt would benefit from skilled PT to address noted impairments and functional limitations (see below for any additional details).  Upon hospital discharge, pt would benefit from HHPT (pt agreeable to this). ?   ?   ? ?Recommendations for follow up therapy are one component of a multi-disciplinary discharge planning process, led by the attending physician.  Recommendations may be updated based on patient status, additional functional criteria and insurance  authorization. ? ?Follow Up Recommendations Home health PT ? ?  ?Assistance Recommended at Discharge PRN  ?Patient can return home with the following ? Assistance with cooking/housework;Assist for transportation ? ?  ?Equipment Recommendations  (pt has own rollator)  ?Recommendations for Other Services ?    ?  ?Functional Status Assessment Patient has had a recent decline in their functional status and/or demonstrates limited ability to make significant improvements in function in a reasonable and predictable amount of time  ? ?  ?Precautions / Restrictions Precautions ?Precautions: Fall ?Precaution Comments: Metastatic bone CA; h/o spinal fractures ?Restrictions ?Weight Bearing Restrictions: No  ? ?  ? ?Mobility ? Bed Mobility ?Overal bed mobility: Modified Independent ?  ?  ?  ?  ?  ?  ?General bed mobility comments: head of stretcher bed elevated; no difficulties noted with bed mobility ?  ? ?Transfers ?Overall transfer level: Modified independent ?Equipment used: Rollator (4 wheels) ?  ?  ?  ?  ?  ?  ?  ?General transfer comment: steady safe transfers with rollator noted ?  ? ?Ambulation/Gait ?Ambulation/Gait assistance: Modified independent (Device/Increase time) ?Gait Distance (Feet): 30 Feet ?Assistive device: Rollator (4 wheels) ?  ?Gait velocity: mildly decreased ?  ?  ?General Gait Details: mild decreased stance time L LE; partial to step through gait pattern; steady with walker use ? ?Stairs ?  ?  ?  ?  ?  ? ?Wheelchair Mobility ?  ? ?Modified [name] (Stroke Patients Only) ?  ? ?  ? ?Balance Overall balance assessment: Needs assistance ?Sitting-balance support: No upper extremity supported, Feet supported ?  Sitting balance-Leahy Scale: Normal ?Sitting balance - Comments: steady sitting reaching outside BOS ?  ?Standing balance support: Bilateral upper extremity supported, During functional activity ?Standing balance-Leahy Scale: Good ?Standing balance comment: steady ambulating with rollator ?  ?  ?  ?  ?   ?  ?  ?  ?  ?  ?  ?   ? ? ? ?Pertinent Vitals/Pain Pain Assessment ?Pain Assessment: Faces ?Faces Pain Scale: Hurts a little bit ?Pain Location: L LE ?Pain Descriptors / Indicators: Aching, Sore ?Pain Intervention(s): Limited activity within patient's tolerance, Monitored during session, Repositioned, Patient requesting pain meds-RN notified ?Vitals (HR and O2 on room air) stable and WFL throughout treatment session.  ? ? ?Home Living Family/patient expects to be discharged to:: Private residence ([place]) ?Living Arrangements: Alone ?Available Help at Discharge: Family;Available PRN/intermittently (occasional assist from pt's daughter) ?Type of Home: Apartment ?Home Access: Level entry ?  ?  ?  ?Home Layout: One level ?Home Equipment: Rollator (4 wheels) ?Additional Comments: Wound care 3x/week for L LE (1x at hospital and 2x at home)  ?  ?Prior Function Prior Level of Function : Independent/Modified Independent ?  ?  ?  ?  ?  ?  ?Mobility Comments: Modified independent ambulating with rollator; no recent falls ?ADLs Comments: Staff bring pt meals; has linen and housekeeping assist 1x/week; staff provide trasportation PRN; pt's daughter also will provide transportation ?  ? ? ?Hand Dominance  ?   ? ?  ?Extremity/Trunk Assessment  ? Upper Extremity Assessment ?Upper Extremity Assessment: Generalized weakness ?  ? ?Lower Extremity Assessment ?Lower Extremity Assessment: Generalized weakness ?  ? ?Cervical / Trunk Assessment ?Cervical / Trunk Assessment: Other exceptions ?Cervical / Trunk Exceptions: forward head/shoulders  ?Communication  ? Communication: No difficulties  ?Cognition Arousal/Alertness: Awake/alert ?Behavior During Therapy: Impulsive ?  ?  ?  ?  ?  ?  ?  ?  ?  ?  ?  ?  ?  ?  ?  ?  ?  ?General Comments: Pt talkative and tangential at times; oriented to person, place, and general situation (time not asked) ?  ?  ? ?  ?General Comments  Nursing cleared pt for participation in  physical therapy.  Pt agreeable to PT session.  Pt's daughter present part of session. ? ?  ?Exercises    ? ?Assessment/Plan  ?  ?PT Assessment Patient needs continued PT services  ?PT Problem List Decreased strength;Decreased activity tolerance;Decreased mobility;Pain ? ?   ?  ?PT Treatment Interventions DME instruction;Gait training;Functional mobility training;Therapeutic activities;Therapeutic exercise;Balance training;Patient/family education   ? ?PT Goals (Current goals can be found in the Care Plan section)  ?Acute Rehab PT Goals ?Patient Stated Goal: to go home ?PT Goal Formulation: With patient ?Time For Goal Achievement: 03/16/22 ?Potential to Achieve Goals: Good ? ?  ?Frequency Min 2X/week ?  ? ? ?Co-evaluation   ?  ?  ?  ?  ? ? ?  ?AM-PAC PT "6 Clicks" Mobility  ?Outcome Measure Help needed turning from your back to your side while in a flat bed without using bedrails?: None ?Help needed moving from lying on your back to sitting on the side of a flat bed without using bedrails?: None ?Help needed moving to and from a bed to a chair (including a wheelchair)?: A Little ?Help needed standing up from a chair using your arms (e.g., wheelchair or bedside chair)?: A Little ?Help needed to walk in hospital room?: A Little ?Help needed  climbing 3-5 steps with a railing? : A Little ?6 Click Score: 20 ? ?  ?End of Session Equipment Utilized During Treatment: Gait belt ?Activity Tolerance: Patient tolerated treatment well ?Patient left: in bed;with call bell/phone within reach (in ED stretcher bed with B railings up) ?Nurse Communication: Mobility status;Precautions;Other (comment) (pt requesting to take her own pain meds) ?PT Visit Diagnosis: Other abnormalities of gait and mobility (R26.89);Muscle weakness (generalized) (M62.81) ?  ? ?Time: 2419-9144 ?PT Time Calculation (min) (ACUTE ONLY): 18 min ? ? ?Charges:   PT Evaluation ?$PT Eval Low Complexity: 1 Low ?  ?  ?   ? ?Leitha Bleak, PT ?03/02/22, 3:16  PM ? ? ?

## 2022-03-02 NOTE — ED Provider Notes (Signed)
? ?[facility] ?Provider Note ? ? ? Event Date/Time  ? First MD Initiated Contact with Patient 03/02/22 1054   ?  (approximate) ? ?History  ? ?Chief Complaint: Weakness ? ?HPI ? ?JORDYAN HARDIMAN is a 76 y.o. female with a past medical history of breast cancer status post radiation, surgery, chemotherapy, now transition to palliative presents to the emergency department for generalized weakness.  According to the patient she was on chemotherapy with her last treatment 2 weeks ago.  States metastatic disease and has now stopped treatment and is in the process of transitioning to more palliative care.  Patient is currently in independent living at [facility] facility, but states she is no longer able to adequately get around her house or care for herself without additional resources.  Patient states she did not know how to proceed neck so she came to the emergency department hoping for help.  Patient denies any acute medical complaints.  Patient is on chronic pain medication. ? ?Physical Exam  ? ?Triage Vital Signs: ?ED Triage Vitals  ?Enc Vitals Group  ?   BP 03/02/22 1007 135/78  ?   Pulse Rate 03/02/22 1007 (!) 106  ?   Resp 03/02/22 1007 17  ?   Temp 03/02/22 1007 99.6 ?F (37.6 ?C)  ?   Temp Source 03/02/22 1007 Oral  ?   SpO2 03/02/22 1007 94 %  ?   Weight 03/02/22 1010 152 lb (68.9 kg)  ?   Height 03/02/22 1010 '5\' 5"'$  (1.651 m)  ?   Head Circumference --   ?   Peak Flow --   ?   Pain Score 03/02/22 1009 7  ?   Pain Loc --   ?   Pain Edu? --   ?   Excl. in [place]? --   ? ? ?Most recent vital signs: ?Vitals:  ? 03/02/22 1007  ?BP: 135/78  ?Pulse: (!) 106  ?Resp: 17  ?Temp: 99.6 ?F (37.6 ?C)  ?SpO2: 94%  ? ? ?General: Awake, no distress.  ?CV:  Good peripheral perfusion.  Regular rate and rhythm  ?Resp:  Normal effort.  Equal breath sounds bilaterally.  ?Abd:  No distention.  Soft, nontender.  No rebound or guarding. ? ? ?ED Results / Procedures / Treatments  ? ?MEDICATIONS ORDERED IN  ED: ?Medications - No data to display ? ? ?IMPRESSION / MDM / ASSESSMENT AND PLAN / ED COURSE  ?I reviewed the triage vital signs and the nursing notes. ? ?Patient presents emergency department from her care facility hoping for additional care resources.  Patient is currently in an independent living facility but believes she now needs assisted living or full care as she is no longer able to adequately care for herself in her current state of weakness.  Patient denies any acute symptoms or issues.  We will check basic labs as well as a COVID swab.  We will consult with physical therapy as well as social work to see what additional resources or placement could be provided.  Patient agreeable.  Of note the patient did walk to her room from triage with a walker. ? ?Patient's urine is normal.  COVID and flu negative.  CBC shows anemia but largely unchanged from baseline.  Chemistry shows no significant acute abnormality.  Social work is seen the patient has worked with the patient to discharge back to her care facility with home health care.  I filled out a home health face-to-face form for  the patient.  Patient will be discharged shortly. ? ?FINAL CLINICAL IMPRESSION(S) / ED DIAGNOSES  ? ?Weakness ? ? ? ?Note:  This document was prepared using Dragon voice recognition software and may include unintentional dictation errors. ?  ?Harvest Dark, MD ?03/02/22 1453 ? ?

## 2022-03-02 NOTE — ED Triage Notes (Addendum)
C/o weakness, neck pain,back pain, and not able to get around as much as possible. Reports she has metastatic breast cancer and it is getting worse and not able to manage where she is. Daughter reports they are here for placement - long term care. Last chemo treatment X2 weeks ago and reports she will not be having anymore. Currently wearing fentanyl patch on abdomen. Patient refused wheelchair from 3 different staff members. Currently using personal glider ?

## 2022-03-02 NOTE — ED Notes (Signed)
Pt assisted to bedside commode and urine sample obtained at this time. Steady gait with rollator noted. Pt assisted back to bed, placed back on monitor, and bed rails in place. Call bed within reach. Pt in NAD.  ?

## 2022-03-02 NOTE — ED Notes (Signed)
Discharge instructions reviewed with patient and pt's daughter [name]) called and reviewed with her as well. Pt's daughter to pick up. Pt waiting in room in NAD at this time. ?

## 2022-03-02 NOTE — TOC Initial Note (Signed)
Transition of Care (TOC) - Initial/Assessment Note  ? ? ?Patient Details  ?Name: Laura Santiago ?MRN: [id number] ?Date of Birth: 11/26/1946 ? ?Transition of Care (TOC) CM/SW Contact:    ?Shelbie Hutching, RN ?Phone Number: ?03/02/2022, 2:49 PM ? ?Clinical Narrative:                 ?Patient brought into the emergency room with reports of weakness and steady decline with breast cancer.  RNCM met with patient at the bedside, introduced self and explained role.  Patient lives at [facility] facility.  Patient denies falls or trouble caring for herself, she said that she does have incontinent episodes and thought that might be a problem, asked if when she was incontinent if she could get into the bathroom and get cleaned up, she reports that she can.   ?Hard to direct patient in to goal of visit and what she hopes to get out of coming to the emergency room, she would like to stay at [facility] but then asks if she is doing okay and can she make it at [facility].   ?Daughter lives in [place] and reports that the patient is calling her multiple times a day for assistance.  ?Patient is currently getting treatment for her cancer and has no intention of stopping treatment, she does not want hospice.  She has had [facility] coming to do wound care and she reports going to the wound clinic, but her last wound clinic visit she missed, she said she just couldn't get herself together to get ready for her appointment. ?Patient has been on pain medication, oxycodone and fentanyl patch for a while, she reports not being able to function without them or without her Adderall, reports taking for 30 years. ? ?Final plan decided is for home with home health, PT recommending HH.  We will order, RN, PT, OT, aide and a SW.  SW can start the process for looking for ALF. ? ?Patient does not currently need skilled nursing facility.  Informed patient and daughter that long term care at ALF or SNF would be private pay.  They  verbalize understanding and think that the patient would be able to afford it.   ? ?Daughter will transport patient back to [facility] today.   ?Sharmon Revere with [facility] will review HH resumption orders and get patient seen by the weekend.   ? ?Expected Discharge Plan: [place] ?Barriers to Discharge: Barriers Resolved ? ? ?Patient Goals and CMS Choice ?Patient states their goals for this hospitalization and ongoing recovery are:: patient is not sure what she wants but thinks that she needs more help ?CMS Medicare.gov Compare Post Acute Care list provided to:: Patient ?Choice offered to / list presented to : Patient, Adult Children ? ?Expected Discharge Plan and Services ?Expected Discharge Plan: [place] ?  ?Discharge Planning Services: CM Consult ?Post Acute Care Choice: Home Health ?Living arrangements for the past 2 months: [place] ?                ?DME Arranged: N/A ?DME Agency: NA ?  ?  ?  ?HH Arranged: RN, PT, OT, Nurse's Aide, Social Work ?[place] Agency: [facility] ?Date HH Agency Contacted: 03/02/22 ?Time [place]: [id number] ?Representative spoke with at [place]: Sharmon Revere ? ?Prior Living Arrangements/Services ?Living arrangements for the past 2 months: [place] ?Lives with:: Self ?Patient language and need for  interpreter reviewed:: Yes ?Do you feel safe going back to the place where you live?: Yes      ?Need for Family Participation in Patient Care: Yes (Comment) ?Care giver support system in place?: Yes (comment) (daughter) ?Current home services: DME ([facility]ator) ?Criminal Activity/Legal Involvement Pertinent to Current Situation/Hospitalization: No - Comment as needed ? ?Activities of Daily Living ?  ?  ? ?Permission Sought/Granted ?Permission sought to share information with : Case Manager, Family Supports, Other (comment), Customer service manager ?Permission granted to share information with  : Yes, Verbal Permission Granted ? Share Information with NAME: [name] ? Permission granted to share info w AGENCY: [facility] ? Permission granted to share info w Relationship: daughter ? Permission granted to share info w Contact Information: [phone] ? ?Emotional Assessment ?Appearance:: Appears stated age ?Attitude/Demeanor/Rapport: Engaged, Inconsistent, Attention Seeking ?Affect (typically observed): Pleasant, Accepting ?Orientation: : Oriented to Self, Oriented to Place, Oriented to  Time, Oriented to Situation ?Alcohol / Substance Use: Not Applicable ?Psych Involvement: No (comment) ? ?Admission diagnosis:  cancer ?Patient Active Problem List  ? Diagnosis Date Noted  ? SIRS (systemic inflammatory response syndrome) () 12/02/2021  ? Venous stasis ulcer with fat layer exposed ([place]) 08/29/2021  ? Tinea pedis of both feet 08/29/2021  ? Cellulitis of right lower extremity 08/29/2021  ? Metastatic breast cancer ([place]) 05/26/2021  ? Back pain 05/17/2021  ? 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 Primary insomnia 03/28/2021  ? Unsteady gait 03/28/2021  ? Lumbar compression fracture, open, initial encounter ([place]) 03/18/2021  ? Posterior vitreous detachment of both eyes 02/08/2021  ? Anemia in stage 4 chronic kidney disease ([place]) 09/03/2020  ? AKI (acute kidney injury) ([place]) 06/26/2020  ? Bacteriuria 11/26/2019  ? Iron deficiency anemia 11/26/2019  ? Venous stasis ulcer of left calf limited to breakdown of skin with varicose veins ([place]) 10/22/2018  ? Myopia of both eyes with astigmatism and presbyopia 05/27/2018  ? 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 Hyperparathyroidism, secondary ([place]) 04/25/2018  ? Lactic acidosis 01/27/2018  ? Generalized muscle weakness 03/31/2016  ? Attention deficit hyperactivity disorder (ADHD), predominantly inattentive type 03/29/2016  ? Osteopenia  03/29/2016  ? Essential hypertension 02/20/2016  ? Metabolic syndrome 01/41/0301  ? Chronic anticoagulation 11/16/2015  ? Right leg swelling 11/16/2015  ? Memory loss 02/05/2015  ? Atrophy of muscle of multiple sites 09/30/2014  ? Physical deconditioning 09/30/2014  ? Primary osteoarthritis of right knee 09/30/2014  ? History of breast cancer in female 09/14/2014  ? History of radiation therapy 09/14/2014  ? Acute embolism and thrombosis of unspecified deep veins of lower extremity, bilateral ([place]) 03/30/2014  ? Lymphadenopathy, inguinal 08/28/2013  ? Venous ulcer of ankle, left ([place]) 08/04/2013  ? GERD (gastroesophageal reflux disease) 06/02/2013  ? ?PCP:  [facility] ?Pharmacy:   ?[facility] - [place], [place] - [address] ?[place] [address] ?Phone: [phone] Fax: [phone] ? ? ? ? ?Social Determinants of Health (SDOH) Interventions ?  ? ?Readmission Risk Interventions ?   ? View : No data to display.  ?  ?  ?  ? ? ? ?

## 2022-03-03 ENCOUNTER — Telehealth: Payer: Self-pay | Admitting: Hospice and Palliative Medicine

## 2022-03-03 ENCOUNTER — Other Ambulatory Visit: Payer: Self-pay | Admitting: Oncology

## 2022-03-03 MED ORDER — FENTANYL 25 MCG/HR TD PT72
1.0000 | MEDICATED_PATCH | TRANSDERMAL | 0 refills | Status: DC
Start: 2022-03-03 — End: 2022-03-22

## 2022-03-03 MED ORDER — NALOXONE HCL 4 MG/0.1ML NA LIQD: NASAL | 0 refills | Status: AC

## 2022-03-03 NOTE — Telephone Encounter (Signed)
I received a refill request for oxycodone #90.  PDMP reviewed.  I last refilled this about a week ago.  I called and spoke with patient and her caregiver Margaretha Sheffield).  Patient states that she is taking 1-2 of the oxycodone (10-'20mg'$ ) about every 3-4 hours and finding adequate control of pain at that dosing and frequency.  She does admit to some noncompliance with the fentanyl patches, although her caregiver states that patient has been wearing a patch each time she has seen the patient.  In light of frequent need for short acting oxycodone, discussed increasing the fentanyl to 25 mcg Q 72 hours.  Both patient and caregiver were in agreement with this plan.  They can utilize two 12.5 mcg patches to equal this dose.  We will go ahead and send new prescription for 25 mcg patches for use after patient has exhausted current supply.  We will go ahead and refill oxycodone as well. ?

## 2022-03-06 ENCOUNTER — Other Ambulatory Visit: Payer: Self-pay | Admitting: *Deleted

## 2022-03-08 ENCOUNTER — Other Ambulatory Visit: Payer: Self-pay

## 2022-03-08 ENCOUNTER — Emergency Department: Payer: Medicare Other

## 2022-03-08 ENCOUNTER — Inpatient Hospital Stay
Admission: EM | Admit: 2022-03-08 | Discharge: 2022-03-10 | DRG: 886 | Disposition: A | Discharge status: Nursing Home (Includes ICF) | Payer: Medicare Other | Source: Skilled Nursing Facility | Attending: Obstetrics and Gynecology | Admitting: Obstetrics and Gynecology

## 2022-03-08 ENCOUNTER — Encounter: Payer: Medicare Other | Admitting: Internal Medicine

## 2022-03-08 ENCOUNTER — Encounter: Payer: Self-pay | Admitting: Oncology

## 2022-03-08 DIAGNOSIS — Z8249 Family history of ischemic heart disease and other diseases of the circulatory system: Secondary | ICD-10-CM

## 2022-03-08 DIAGNOSIS — T451X5A Adverse effect of antineoplastic and immunosuppressive drugs, initial encounter: Secondary | ICD-10-CM | POA: Diagnosis present

## 2022-03-08 DIAGNOSIS — Z823 Family history of stroke: Secondary | ICD-10-CM | POA: Diagnosis not present

## 2022-03-08 DIAGNOSIS — I129 Hypertensive chronic kidney disease with stage 1 through stage 4 chronic kidney disease, or unspecified chronic kidney disease: Secondary | ICD-10-CM | POA: Diagnosis present

## 2022-03-08 DIAGNOSIS — F330 Major depressive disorder, recurrent, mild: Secondary | ICD-10-CM | POA: Diagnosis present

## 2022-03-08 DIAGNOSIS — N2581 Secondary hyperparathyroidism of renal origin: Secondary | ICD-10-CM | POA: Diagnosis present

## 2022-03-08 DIAGNOSIS — Z85819 Personal history of malignant neoplasm of unspecified site of lip, oral cavity, and pharynx: Secondary | ICD-10-CM | POA: Diagnosis not present

## 2022-03-08 DIAGNOSIS — F900 Attention-deficit hyperactivity disorder, predominantly inattentive type: Principal | ICD-10-CM | POA: Diagnosis present

## 2022-03-08 DIAGNOSIS — Z808 Family history of malignant neoplasm of other organs or systems: Secondary | ICD-10-CM | POA: Diagnosis not present

## 2022-03-08 DIAGNOSIS — C7951 Secondary malignant neoplasm of bone: Secondary | ICD-10-CM | POA: Diagnosis present

## 2022-03-08 DIAGNOSIS — D631 Anemia in chronic kidney disease: Secondary | ICD-10-CM | POA: Diagnosis present

## 2022-03-08 DIAGNOSIS — Z86718 Personal history of other venous thrombosis and embolism: Secondary | ICD-10-CM | POA: Diagnosis not present

## 2022-03-08 DIAGNOSIS — R413 Other amnesia: Secondary | ICD-10-CM | POA: Diagnosis present

## 2022-03-08 DIAGNOSIS — Z20822 Contact with and (suspected) exposure to covid-19: Secondary | ICD-10-CM | POA: Diagnosis present

## 2022-03-08 DIAGNOSIS — R5381 Other malaise: Secondary | ICD-10-CM | POA: Diagnosis present

## 2022-03-08 DIAGNOSIS — R4182 Altered mental status, unspecified: Secondary | ICD-10-CM | POA: Diagnosis present

## 2022-03-08 DIAGNOSIS — Z833 Family history of diabetes mellitus: Secondary | ICD-10-CM

## 2022-03-08 DIAGNOSIS — I83028 Varicose veins of left lower extremity with ulcer other part of lower leg: Secondary | ICD-10-CM | POA: Diagnosis present

## 2022-03-08 DIAGNOSIS — Z66 Do not resuscitate: Secondary | ICD-10-CM | POA: Diagnosis present

## 2022-03-08 DIAGNOSIS — F909 Attention-deficit hyperactivity disorder, unspecified type: Secondary | ICD-10-CM

## 2022-03-08 DIAGNOSIS — I10 Essential (primary) hypertension: Secondary | ICD-10-CM | POA: Diagnosis present

## 2022-03-08 DIAGNOSIS — Z853 Personal history of malignant neoplasm of breast: Secondary | ICD-10-CM

## 2022-03-08 DIAGNOSIS — F411 Generalized anxiety disorder: Secondary | ICD-10-CM | POA: Diagnosis not present

## 2022-03-08 DIAGNOSIS — G620 Drug-induced polyneuropathy: Secondary | ICD-10-CM | POA: Diagnosis present

## 2022-03-08 DIAGNOSIS — Z79899 Other long term (current) drug therapy: Secondary | ICD-10-CM | POA: Diagnosis not present

## 2022-03-08 DIAGNOSIS — C50919 Malignant neoplasm of unspecified site of unspecified female breast: Secondary | ICD-10-CM | POA: Diagnosis present

## 2022-03-08 DIAGNOSIS — I83029 Varicose veins of left lower extremity with ulcer of unspecified site: Secondary | ICD-10-CM | POA: Insufficient documentation

## 2022-03-08 DIAGNOSIS — K219 Gastro-esophageal reflux disease without esophagitis: Secondary | ICD-10-CM | POA: Diagnosis present

## 2022-03-08 DIAGNOSIS — Z515 Encounter for palliative care: Secondary | ICD-10-CM

## 2022-03-08 DIAGNOSIS — Z923 Personal history of irradiation: Secondary | ICD-10-CM

## 2022-03-08 DIAGNOSIS — Z7901 Long term (current) use of anticoagulants: Secondary | ICD-10-CM

## 2022-03-08 DIAGNOSIS — N1832 Chronic kidney disease, stage 3b: Secondary | ICD-10-CM | POA: Diagnosis present

## 2022-03-08 DIAGNOSIS — Z800 Family history of malignant neoplasm of digestive organs: Secondary | ICD-10-CM | POA: Diagnosis not present

## 2022-03-08 DIAGNOSIS — G8929 Other chronic pain: Secondary | ICD-10-CM | POA: Diagnosis present

## 2022-03-08 DIAGNOSIS — F902 Attention-deficit hyperactivity disorder, combined type: Secondary | ICD-10-CM | POA: Diagnosis not present

## 2022-03-08 LAB — URINALYSIS, ROUTINE W REFLEX MICROSCOPIC
Bacteria, UA: NONE SEEN
Bilirubin Urine: NEGATIVE
Glucose, UA: NEGATIVE mg/dL
Hgb urine dipstick: NEGATIVE
Ketones, ur: NEGATIVE mg/dL
Nitrite: NEGATIVE
Protein, ur: 30 mg/dL — AB
Specific Gravity, Urine: 1.016 (ref 1.005–1.030)
pH: 7 (ref 5.0–8.0)

## 2022-03-08 LAB — CBC
HCT: 29.4 % — ABNORMAL LOW (ref 36.0–46.0)
Hemoglobin: 8.8 g/dL — ABNORMAL LOW (ref 12.0–15.0)
MCH: 26.1 pg (ref 26.0–34.0)
MCHC: 29.9 g/dL — ABNORMAL LOW (ref 30.0–36.0)
MCV: 87.2 fL (ref 80.0–100.0)
Platelets: 319 10*3/uL (ref 150–400)
RBC: 3.37 MIL/uL — ABNORMAL LOW (ref 3.87–5.11)
RDW: 16.5 % — ABNORMAL HIGH (ref 11.5–15.5)
WBC: 7.1 10*3/uL (ref 4.0–10.5)
nRBC: 0 % (ref 0.0–0.2)

## 2022-03-08 LAB — COMPREHENSIVE METABOLIC PANEL
ALT: 10 U/L (ref 0–44)
AST: 17 U/L (ref 15–41)
Albumin: 3 g/dL — ABNORMAL LOW (ref 3.5–5.0)
Alkaline Phosphatase: 59 U/L (ref 38–126)
Anion gap: 9 (ref 5–15)
BUN: 24 mg/dL — ABNORMAL HIGH (ref 8–23)
CO2: 25 mmol/L (ref 22–32)
Calcium: 8.6 mg/dL — ABNORMAL LOW (ref 8.9–10.3)
Chloride: 104 mmol/L (ref 98–111)
Creatinine, Ser: 1.42 mg/dL — ABNORMAL HIGH (ref 0.44–1.00)
GFR, Estimated: 38 mL/min — ABNORMAL LOW (ref >60)
Glucose, Bld: 134 mg/dL — ABNORMAL HIGH (ref 70–99)
Potassium: 4.2 mmol/L (ref 3.5–5.1)
Sodium: 138 mmol/L (ref 135–145)
Total Bilirubin: 0.5 mg/dL (ref 0.3–1.2)
Total Protein: 7.8 g/dL (ref 6.5–8.1)

## 2022-03-08 LAB — PROTIME-INR
INR: 1.2 (ref 0.8–1.2)
Prothrombin Time: 15.4 seconds — ABNORMAL HIGH (ref 11.4–15.2)

## 2022-03-08 MED ORDER — ACETAMINOPHEN 325 MG PO TABS
650.0000 mg | ORAL_TABLET | Freq: Four times a day (QID) | ORAL | Status: DC | PRN
  Administered 2022-03-10: 650 mg via ORAL
  Filled 2022-03-08: qty 2

## 2022-03-08 MED ORDER — ONDANSETRON HCL 4 MG PO TABS: 4.0000 mg | ORAL_TABLET | Freq: Four times a day (QID) | ORAL | Status: DC | PRN

## 2022-03-08 MED ORDER — SENNOSIDES-DOCUSATE SODIUM 8.6-50 MG PO TABS: 1.0000 | ORAL_TABLET | Freq: Every evening | ORAL | Status: DC | PRN

## 2022-03-08 MED ORDER — FENTANYL 25 MCG/HR TD PT72
1.0000 | MEDICATED_PATCH | TRANSDERMAL | Status: DC
  Administered 2022-03-08: 1 via TRANSDERMAL
  Filled 2022-03-08: qty 1

## 2022-03-08 MED ORDER — ONDANSETRON HCL 4 MG/2ML IJ SOLN
4.0000 mg | Freq: Four times a day (QID) | INTRAMUSCULAR | Status: DC | PRN
Start: 2022-03-08 — End: 2022-03-10

## 2022-03-08 MED ORDER — OXYCODONE HCL 5 MG PO TABS
10.0000 mg | ORAL_TABLET | ORAL | Status: DC | PRN
  Administered 2022-03-09 – 2022-03-10 (×4): 10 mg via ORAL
  Filled 2022-03-08 (×4): qty 2

## 2022-03-08 MED ORDER — GABAPENTIN 100 MG PO CAPS
200.0000 mg | ORAL_CAPSULE | Freq: Two times a day (BID) | ORAL | Status: DC
  Administered 2022-03-08: 200 mg via ORAL
  Filled 2022-03-08: qty 2

## 2022-03-08 MED ORDER — ACETAMINOPHEN 650 MG RE SUPP: 650.0000 mg | Freq: Four times a day (QID) | RECTAL | Status: DC | PRN

## 2022-03-08 MED ORDER — ENOXAPARIN SODIUM 40 MG/0.4ML IJ SOSY
40.0000 mg | PREFILLED_SYRINGE | Freq: Every day | INTRAMUSCULAR | Status: DC
  Filled 2022-03-08: qty 0.4

## 2022-03-08 NOTE — Assessment & Plan Note (Signed)
Continue gabapentin.

## 2022-03-08 NOTE — Assessment & Plan Note (Addendum)
Related to cancer and general decline chronic medical comorbidities.  Was evaluated in the ED on 3/23 for weakness.  Consider palliative care consult ?

## 2022-03-08 NOTE — Assessment & Plan Note (Addendum)
No acute infection suspected ?Wound care and keep legs elevated ?No evidence of cellulitis.  Patient was hospitalized for sepsis secondary to cellulitis in December 2022. ?

## 2022-03-08 NOTE — ED Triage Notes (Signed)
Pt comes pov with daughter. Daughter wants pt checked in for AMS but pt able to answer all of my orientation questions. Daughter leaves upon checking pt in and asks to be notified if pt gets a room. Pt starts yelling at daughter that she doesn't want her notified and doesn't want to see her again. Daughter insists that she needs to be evaluated for AMS/mental health issues. Pt was seen here two days ago for possible placement.  ? ?Pt does not feel that anything is wrong with her right now. Denies SI/HI/AVH at this time.  ?

## 2022-03-08 NOTE — Assessment & Plan Note (Signed)
Blood pressure controlled.  Continue lisinopril ?

## 2022-03-08 NOTE — ED Notes (Signed)
Urine sent to lab 

## 2022-03-08 NOTE — Assessment & Plan Note (Signed)
s/p surgery and chemoradiation, now transitioning to palliative care, ?Continue pain meds ?

## 2022-03-08 NOTE — Assessment & Plan Note (Signed)
Etiology unclear.  At this time unable to explain mental status changes with work-up thus far ?Will get neurology consult as well as behavioral health consult ?Will hold off on MRI pending recommendations from neurology ?Neurologic checks ?

## 2022-03-08 NOTE — ED Notes (Signed)
Per triage note, pt BIB daughter for AMS. Pt has no complaints. Pt states she is here "because nobody knows what to do with me". ?

## 2022-03-08 NOTE — Assessment & Plan Note (Signed)
At baseline 

## 2022-03-08 NOTE — ED Notes (Signed)
Psych NP at bedside at this time.

## 2022-03-08 NOTE — ED Notes (Signed)
Medications not verified at this time by pharmacy. ?

## 2022-03-08 NOTE — Assessment & Plan Note (Signed)
No longer on Xarelto ?

## 2022-03-08 NOTE — Assessment & Plan Note (Signed)
Renal function at baseline 

## 2022-03-08 NOTE — H&P (Signed)
?History and Physical  ? ? ?Patient: Laura Santiago JIR:[id number] DOB: 28-Jun-1946 ?DOA: 03/08/2022 ?DOS: the patient was seen and examined on 03/08/2022 ?PCP: [facility]  ?Patient coming from: Home ? ?Chief Complaint:  ?Chief Complaint  ?Patient presents with  ? Altered Mental Status  ? ? ?HPI: Laura Santiago is a 76 y.o. 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  Patient was evaluated in the ED on 3/23 with a complaint of weakness, with difficulty getting around the facility and inadequate ability to care for self.  Work-up in the ED at that time was unremarkable and she was discharged back to the facility however over the course of the past few days daughter reports increasing concern for her behavior. ?ED course: Temperature 99 with otherwise normal vitals.  WBC 7100, hemoglobin at baseline at 8.8.  CMP with BUN/creatinine of 24/1.42, essentially unchanged from recent visit.  Urinalysis unremarkable.  CT head showed findings consistent with microvascular disease changes but no acute intracranial abnormality ?Hospitalist consulted for admission  ? ?Review of Systems: As mentioned in the history of present illness. All other systems reviewed and are negative. ?Past Medical History:  ?Diagnosis Date  ? ADHD (attention deficit hyperactivity disorder)   ? in [place], no date on md note  ? Anemia   ? IDA 11/26/2019, Anemia in stage 4 chronic kidney disease ([place]) 09/03/2020  ? Arthritis   ? osteoarthritis right knee 09/30/2014  ? Breast cancer (Laura Santiago)  10/05/2013  ? in [place], [place] -, Her 2 is 3+  ? Cancer related pain 03/28/2021  ? in [name], md notes spine mets  ? cervical compression fracture 03/18/2021  ? in [place]  ? DVT of lower extremity, bilateral (Laura Santiago) 03/30/2014  ? in [place]  ? Generalized muscle weakness 03/31/2016  ? in [facility]  ? GERD (gastroesophageal reflux disease) 05/15/2013  ? per md in [place]  ? Hyperparathyroidism, secondary ([place]) 04/25/2018  ? in [place]  ? Hypertension 02/20/2016  ? info from MD in [facility]  ? Memory loss 02/05/2015  ? in [place]  ? Metabolic acidosis 75/09/2584  ? in Laura [place]  ? Metabolic syndrome 27/78/2423  ? in Laura [facility]  ? Osteopenia after menopause 03/29/2016  ? in Laura [facility]  ? 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 in [facility]  ? Stasis ulcer of left lower extremity (Laura Santiago) 03/29/2016  ? in Laura Santiago  ? ?Past Surgical History:  ?Procedure Laterality Date  ? CESAREAN SECTION    ? unknown  ? fibroid removed  N/A   ? in [place] - unknown date  ? IR FLUORO GUIDE CV LINE LEFT  07/27/2021  ? IR PORT REPAIR CENTRAL VENOUS ACCESS DEVICE Left   ? In [place]  ? MOHS SURGERY N/A 02/25/2014  ? in [place]  ? ovary removed     ? unknown  ? PORTA CATH INSERTION N/A 02/13/2022  ? Procedure: PORTA CATH INSERTION;  Surgeon: Algernon Huxley, MD;  Location: [facility];  Service: Cardiovascular;  Laterality: N/A;  ? Laura [facility] CATARACT EXTRACAP,INSERT LENS Bilateral  Bilateral 09/05/2012  ? in [facility]  ?  TONSILLECTOMY    ? unknown  ?  LUMPECTOMY Right 04/03/2014  ? in [place]  ? ?Social History:  reports that she has never smoked. She has never used smokeless tobacco. She reports that she does not currently use alcohol. She reports current drug use. ? ?Allergies  ?Allergen Reactions  ? Corticosteroids Other (See Comments)  ?  Pt trf from [facility] [place] and per primary md for her cancer tx. Notes that it causes agitation intolerance  ? Sulfa Antibiotics Other (See Comments)  ?  Pt moved from [facility] [place] and in MD notes she has allergy but we do not know reactions when taking the drug  ? Celebrex [Celecoxib] Rash   ? ? ?Family History  ?Problem Relation Age of Onset  ? Pancreatic cancer Mother   ? Stroke Father   ? Diabetes Father   ? Hypertension Father   ? Heart disease Father   ? Skin cancer Father   ? Varicose Veins Father   ? Skin cancer Brother   ? Cancer - Prostate Brother   ? ? ?Prior to Admission medications   ?Medication Sig Start Date End Date Taking? Authorizing Provider  ?acetaminophen (TYLENOL) 500 MG tablet Take 500-1,000 mg by mouth every 6 (six) hours as needed for mild pain or moderate pain. ?Patient not taking: Reported on 01/20/2022    [provider]  ?amphetamine-dextroamphetamine (ADDERALL XR) 30 MG 24 hr capsule Take 1 capsule (30 mg total) by mouth daily. 12/08/21   Jennye Boroughs, MD  ?Calcium 200 MG TABS Take 1 tablet by mouth daily.    [provider]  ?fentaNYL (DURAGESIC) 25 MCG/HR Place 1 patch onto the skin every 3 (three) days. 03/03/22   Borders, Kirt Boys, NP  ?gabapentin (NEURONTIN) 400 MG capsule Take 200 mg by mouth 2 (two) times daily. 03/02/21   [provider]  ?hydrOXYzine (VISTARIL) 25 MG capsule Take 50 mg by mouth every 8 (eight) hours as needed. 01/11/22   [provider]  ?lisinopril (ZESTRIL) 20 MG tablet Take 20 mg by mouth daily.    [provider]  ?LORazepam (ATIVAN) 0.5 MG tablet 0.5 mg every 4 (four) hours as needed. 10/21/21   [provider]  ?Multiple Vitamins-Minerals (MULTIVITAMIN ADULTS) TABS Take 1 tablet by mouth daily.    [provider]  ?naloxone (NARCAN) nasal spray 4 mg/0.1 mL SPRAY 1 SPRAY INTO ONE NOSTRIL AS DIRECTED FOR OPIOID OVERDOSE (TURN PERSON ON SIDE AFTER DOSE. IF NO RESPONSE IN 2-3 MINUTES OR PERSON RESPONDS BUT RELAPSES, REPEAT USING A NEW SPRAY DEVICE AND SPRAY INTO THE OTHER NOSTRIL. CALL 911 AFTER USE.) * EMERGENCY USE ONLY * 03/03/22   Borders, Kirt Boys, NP  ?ondansetron (ZOFRAN) 8 MG tablet  06/28/21   [provider]  ?Oxycodone HCl 10 MG TABS TAKE 1 TABLET BY MOUTH EVERY 4 HOURS  AS NEEDED 03/03/22   Borders, Kirt Boys, NP  ?rivaroxaban (XARELTO) 20 MG TABS tablet Take 1 tablet (20 mg total) by mouth daily with supper. 02/01/22   [name], MD  ?sodium hypochlorite (DAKIN'S 1/4 STRENGTH) 0.125 % SOLN Apply 1 application topically as directed. Moisten gauze with solution and wrap wound    [provider]  ?terbinafine (LAMISIL AT) 1 % cream Apply 1 application topically 2 (two) times daily. 08/29/21   Rosiland Oz, MD  ?zolpidem Lorrin Mais) 5 MG tablet  09/30/21   [provider]  ? ? ?Physical Exam: ?Vitals:  ? 03/08/22 1614  ?BP: 136/87  ?Pulse: 91  ?  Resp: 18  ?Temp: 99 ?F (37.2 ?C)  ?TempSrc: Oral  ?SpO2: 97%  ?Weight: 68.9 kg  ? ?Physical Exam ?Vitals and nursing note reviewed.  ?Constitutional:   ?   General: She is not in acute distress. ?HENT:  ?   Head: Normocephalic and atraumatic.  ?Cardiovascular:  ?   Rate and Rhythm: Normal rate and regular rhythm.  ?   Pulses: Normal pulses.  ?   Heart sounds: Normal heart sounds.  ?Pulmonary:  ?   Effort: Pulmonary effort is normal.  ?   Breath sounds: Normal breath sounds.  ?Abdominal:  ?   Palpations: Abdomen is soft.  ?   Tenderness: There is no abdominal tenderness.  ?Musculoskeletal:  ?   Comments: Dressing left lower leg  ?Neurological:  ?   Mental Status: Mental status is at baseline.  ? ? ? ?Data Reviewed: ?Relevant notes from primary care and specialist visits, past discharge summaries as available in EHR, including Care Everywhere. ?Prior diagnostic testing as pertinent to current admission diagnoses ?Updated medications and problem lists for reconciliation ?ED course, including vitals, labs, imaging, treatment and response to treatment ?Triage notes, nursing and pharmacy notes and ED provider's notes ?Notable results as noted in HPI ? ? ?Assessment and Plan: ?* Altered mental status, unspecified ?Etiology unclear.  At this time unable to explain mental status changes with work-up thus far ?Will get neurology  consult as well as behavioral health consult ?Will hold off on MRI pending recommendations from neurology ?Neurologic checks ? ?ADHD (attention deficit hyperactivity disorder) ?Continue adderall pending verifica

## 2022-03-08 NOTE — ED Provider Notes (Signed)
? ?[facility] ?Provider Note ? ? ? Event Date/Time  ? First MD Initiated Contact with Patient 03/08/22 1734   ?  (approximate) ? ? ?History  ? ?Altered Mental Status ? ? ?HPI ? ?Laura Santiago is a 76 y.o. female who presents to the ED for evaluation of Altered Mental Status ?  ?I reviewed outpatient oncology visit from 3/17.  History of breast cancer with mets to the bone.  HTN, anxiety.  Anticoagulated on Xarelto.  Chronic opiates. ? ?Presents to the ED for altered mentation versus placement.  Daughter dropped her off and does not stay.  I called the daughter later, as documented below, and she provides some supplemental history that patient has become increasingly altered and difficult to manage.  She resides at a local independent living facility and the ILF refuses to take her back.  Patient has become more threatening, confused and inconsistent over the past couple days.  Daughter is concerned about drug intoxication or polypharmacy. ? ? ?Physical Exam  ? ?Triage Vital Signs: ?ED Triage Vitals [03/08/22 1614]  ?Enc Vitals Group  ?   BP 136/87  ?   Pulse Rate 91  ?   Resp 18  ?   Temp 99 ?F (37.2 ?C)  ?   Temp Source Oral  ?   SpO2 97 %  ?   Weight 152 lb (68.9 kg)  ?   Height   ?   Head Circumference   ?   Peak Flow   ?   Pain Score 0  ?   Pain Loc   ?   Pain Edu?   ?   Excl. in [place]?   ? ? ?Most recent vital signs: ?Vitals:  ? 03/08/22 1614  ?BP: 136/87  ?Pulse: 91  ?Resp: 18  ?Temp: 99 ?F (37.2 ?C)  ?SpO2: 97%  ? ? ?General: Awake, no distress.  Oriented to self, situation, year and location. ?CV:  Good peripheral perfusion.  ?Resp:  Normal effort.  ?Abd:  No distention.  ?MSK:  No deformity noted.  ?Neuro:  No focal deficits appreciated. Cranial nerves II through XII intact ?5/5 strength and sensation in all 4 extremities ?Other:   ? ? ?ED Results / Procedures / Treatments  ? ?Labs ?(all labs ordered are listed, but only abnormal results are displayed) ?Labs Reviewed  ?COMPREHENSIVE  METABOLIC PANEL - Abnormal; Notable for the following components:  ?    Result Value  ? Glucose, Bld 134 (*)   ? BUN 24 (*)   ? Creatinine, Ser 1.42 (*)   ? Calcium 8.6 (*)   ? Albumin 3.0 (*)   ? GFR, Estimated 38 (*)   ? All other components within normal limits  ?CBC - Abnormal; Notable for the following components:  ? RBC 3.37 (*)   ? Hemoglobin 8.8 (*)   ? HCT 29.4 (*)   ? MCHC 29.9 (*)   ? RDW 16.5 (*)   ? All other components within normal limits  ?PROTIME-INR - Abnormal; Notable for the following components:  ? Prothrombin Time 15.4 (*)   ? All other components within normal limits  ?URINALYSIS, ROUTINE W REFLEX MICROSCOPIC - Abnormal; Notable for the following components:  ? Color, Urine YELLOW (*)   ? APPearance CLEAR (*)   ? Protein, ur 30 (*)   ? Leukocytes,Ua TRACE (*)   ? All other components within normal limits  ?CBG MONITORING, ED  ? ? ?EKG ? ? ?RADIOLOGY ?CT head  reviewed by me without evidence of acute intracranial pathology. ? ?Official radiology report(s): ?CT HEAD WO CONTRAST (5MM) ? ?Result Date: 03/08/2022 ?CLINICAL DATA:  Altered mental status. EXAM: CT HEAD WITHOUT CONTRAST TECHNIQUE: Contiguous axial images were obtained from the base of the skull through the vertex without intravenous contrast. RADIATION DOSE REDUCTION: This exam was performed according to the departmental dose-optimization program which includes automated exposure control, adjustment of the mA and/or kV according to patient size and/or use of iterative reconstruction technique. COMPARISON:  December 02, 2021 FINDINGS: Brain: There is mild cerebral atrophy with widening of the extra-axial spaces and ventricular dilatation. There are areas of decreased attenuation within the white matter tracts of the supratentorial brain, consistent with microvascular disease changes. Vascular: No hyperdense vessel or unexpected calcification. Skull: Normal. Negative for fracture or focal lesion. Sinuses/Orbits: No acute finding. Other:  None. IMPRESSION: No acute intracranial abnormality. Electronically Signed   By: Virgina Norfolk M.D.   On: 03/08/2022 19:41   ? ?PROCEDURES and INTERVENTIONS: ? ?.1-3 Lead EKG Interpretation ?Performed by: Vladimir Crofts, MD ?Authorized by: Vladimir Crofts, MD  ? ?  Interpretation: normal   ?  ECG rate:  88 ?  ECG rate assessment: normal   ?  Rhythm: sinus rhythm   ?  Ectopy: none   ?  Conduction: normal   ? ?Medications  ?gabapentin (NEURONTIN) capsule 200 mg (has no administration in time range)  ?Oxycodone HCl TABS 10 mg (has no administration in time range)  ?fentaNYL (DURAGESIC) 25 MCG/HR 1 patch (has no administration in time range)  ?enoxaparin (LOVENOX) injection 40 mg (has no administration in time range)  ?acetaminophen (TYLENOL) tablet 650 mg (has no administration in time range)  ?  Or  ?acetaminophen (TYLENOL) suppository 650 mg (has no administration in time range)  ?ondansetron (ZOFRAN) tablet 4 mg (has no administration in time range)  ?  Or  ?ondansetron (ZOFRAN) injection 4 mg (has no administration in time range)  ?senna-docusate (Senokot-S) tablet 1 tablet (has no administration in time range)  ? ? ? ?IMPRESSION / MDM / ASSESSMENT AND PLAN / ED COURSE  ?I reviewed the triage vital signs and the nursing notes. ? ?76 year old female presents to the ED with altered mentation and confusion requiring medical observation admission.  No evidence of neurologic or vascular deficits.  No signs of trauma.  She is oriented, but intermittently seems confused and has some delusions.  Blood work is benign with chronic anemia and CKD.  Urine without infectious features and CT head without evidence of metastases or intracranial pathology.  I consult with medicine who agrees to admit for observation and possible placement. ? ?Clinical Course as of 03/08/22 2148  ?Wed Mar 08, 2022  ?1920 I call daughter, [name]. She was having some mental issues. Her mind was not with it. She missed her wound care appointment.  Psychotic episode. Caretaker today thought patient was jumping back and forth between topics. Sometimes says she desperately needs help, but then refuses to get help help when offered. Can't care for herself.  [DS]  ?  ?Clinical Course User Index ?[DS] Vladimir Crofts, MD  ? ? ? ?FINAL CLINICAL IMPRESSION(S) / ED DIAGNOSES  ? ?Final diagnoses:  ?Altered mental status, unspecified altered mental status type  ? ? ? ?Rx / DC Orders  ? ?ED Discharge Orders   ? ? None  ? ?  ? ? ? ?Note:  This document was prepared using Dragon voice recognition software and may include unintentional dictation errors. ?  ?  Vladimir Crofts, MD ?03/08/22 2149 ? ?

## 2022-03-08 NOTE — Consult Note (Signed)
[facility] Face-to-Face Psychiatry Consult  ? ?Reason for Consult: Altered Mental Status  ?Referring Physician: Dr. Tamala Julian ?Patient Identification: Laura Santiago ?MRN:  [id number] ?Principal Diagnosis: Altered mental status, unspecified ?Diagnosis:  Principal Problem: ?  Altered mental status, unspecified ?Active Problems: ?  Metastatic breast cancer ([place]) ?  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  Attention deficit hyperactivity disorder (ADHD), predominantly inattentive type ?  Essential hypertension ?  Memory loss ?  Physical deconditioning ?  Stage 3b chronic kidney disease ([place]) ?  GAD (generalized anxiety disorder) ?  Peripheral neuropathy due to chemotherapy [facility]) ?  Personal history of DVT (deep vein thrombosis) ?  Altered mental status ? ? ?Total Time spent with patient: 1 hour ? ?Subjective: "My daughter got me here." ?Laura Santiago is a 76 y.o. female patient presented to  [facility] ED via POV with her daughter at her side in the ER. The patient's daughter wants her mom to be checked for AMS. The patient shared that she has many medical problems, and recently her cancer had metalized her spine. 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 ? ?This provider saw the patient face-to-face; the chart was reviewed on 03/08/2022 due to the patient's care. The patient does not meet the criteria to be admitted to the geriatric-psychiatric inpatient unit.  ?On evaluation, the patient is alert and oriented x4, anxious but cooperative, and mood-congruent with affect.  ?The patient does not appear to be responding to internal or external stimuli. Neither is the patient presenting with any delusional thinking. The patient denies auditory or visual hallucinations. The patient denies any suicidal, homicidal, or self-harm ideations. The patient is not presenting with any  psychotic or paranoid behaviors. During an encounter with the patient, she could answer questions appropriately. ? ?HPI: Per Dr. Tamala Julian, Laura Santiago is a 76 y.o. female who presents to the ED for evaluation of Altered Mental Status ?I reviewed outpatient oncology visit from 3/17.  History of breast cancer with mets to the bone.  HTN, anxiety. Anticoagulated on Xarelto.Chronic opiates. ?Presents to the ED for altered mentation versus placement.  Daughter dropped her off and does not stay.  I called the daughter later, as documented below, and she provides some supplemental history that patient has become increasingly altered and difficult to manage.  She resides at a local independent living facility and the ILF refuses to take her back.  Patient has become more threatening, confused and inconsistent over the past couple days.  Daughter is concerned about drug intoxication or polypharmacy. ? ?Past Psychiatric History:  ?ADHD (attention deficit hyperactivity disorder) ? ?Risk to Self:   ?Risk to Others:   ?Prior Inpatient Therapy:   ?Prior Outpatient Therapy:   ? ?Past Medical History:  ?Past Medical History:  ?Diagnosis Date  ? ADHD (attention deficit hyperactivity disorder)   ? in [place], no date on md note  ? Anemia   ? IDA 11/26/2019, Anemia in stage 4 chronic kidney disease ([place]) 09/03/2020  ? Arthritis   ? osteoarthritis right knee 09/30/2014  ? Breast cancer ([place]) 10/05/2013  ? in [place] +, [place] -, Her 2 is 3+  ? Cancer related pain 03/28/2021  ? in [name], md notes spine mets  ? cervical compression fracture 03/18/2021  ? in [place]  ? DVT of lower extremity, bilateral ([place])  03/30/2014  ? in [name]  ? Generalized muscle weakness 03/31/2016  ? in [name]  ? GERD (gastroesophageal reflux disease) 05/15/2013  ? per md in [name]  ? Hyperparathyroidism, secondary ([place]) 04/25/2018  ? in [name]  ? Hypertension 02/20/2016  ? info from MD in [name]  ? Memory loss 02/05/2015  ? in [name]  ? Metabolic acidosis 75/64/3329  ? in [place]  ?  Metabolic syndrome 51/88/4166  ? in [name]  ? Osteopenia after menopause 03/29/2016  ? in [name]  ? 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 in [name]  ? Stasis ulcer of left lower extremity ([name]) 03/29/2016  ? in [name]  ?  ?Past Surgical History:  ?Procedure Laterality Date  ? CESAREAN SECTION    ? unknown  ? fibroid removed  N/A   ? in [place] - unknown date  ? IR FLUORO GUIDE CV LINE LEFT  07/27/2021  ? IR PORT REPAIR CENTRAL VENOUS ACCESS DEVICE Left   ? In [name]  ? MOHS SURGERY N/A 02/25/2014  ? in [name]  ? ovary removed     ? unknown  ? PORTA CATH INSERTION N/A 02/13/2022  ? Procedure: PORTA CATH INSERTION;  Surgeon: Algernon Huxley, MD;  Location: [place] [facility];  Service: Cardiovascular;  Laterality: N/A;  ? [place] CATARACT EXTRACAP,INSERT LENS Bilateral  Bilateral 09/05/2012  ? in [place]    ? unknown  ?  LUMPECTOMY Right 04/03/2014  ? in [place]  ? ?Family History:  ?Family History  ?Problem Relation Age of Onset  ? Pancreatic cancer Mother   ? Stroke Father   ? Diabetes Father   ? Hypertension Father   ? Heart disease Father   ? Skin cancer Father   ? Varicose Veins Father   ? Skin cancer Brother   ? Cancer - Prostate Brother   ? ?Family Psychiatric  History:  ?Social History:  ?Social History  ? ?Substance and Sexual Activity  ?Alcohol Use Not Currently  ?   ?Social History  ? ?Substance and Sexual Activity  ?Drug Use Yes  ? Comment: prescribed oxy, fentanyl patch  ?  ?Social History  ? ?Socioeconomic History  ? Marital status: Divorced  ?  Spouse name: Not on file  ? Number of children: Not on file  ? Years of education: Not on file  ? Highest education level: Not on file  ?Occupational History  ? Occupation: retired Teacher, music  ?  Comment: In [place]  ?Tobacco Use  ? Smoking status: Never  ? Smokeless tobacco: Never  ?Vaping Use  ? Vaping Use: Never used  ?Substance and Sexual Activity  ? Alcohol use: Not Currently  ? Drug use: Yes  ?  Comment: prescribed oxy, fentanyl patch  ? Sexual  activity: Not Currently  ?Other Topics Concern  ? Not on file  ?Social History Narrative  ? Not on file  ? ?Social Determinants of Health  ? ?Financial Resource Strain: Not on file  ?Food Insecurity: Not on file  ?Transportation Needs: Not on file  ?Physical Activity: Not on file  ?Stress: Not on file  ?Social Connections: Not on file  ? ?Additional Social History: ?  ? ?Allergies:   ?Allergies  ?Allergen Reactions  ? Corticosteroids Other (See Comments)  ?  Pt trf from [name] and per primary md for her cancer tx. Notes that it causes agitation intolerance  ? Sulfa Antibiotics Other (See Comments)  ?  Pt moved from [name] and in  MD notes she has allergy but we do not know reactions when taking the drug  ? Celebrex [Celecoxib] Rash  ? ? ?Labs:  ?Results for orders placed or performed during the hospital encounter of 03/08/22 (from the past 48 hour(s))  ?Comprehensive metabolic panel     Status: Abnormal  ? Collection Time: 03/08/22  4:16 PM  ?Result Value Ref Range  ? Sodium 138 135 - 145 mmol/L  ? Potassium 4.2 3.5 - 5.1 mmol/L  ? Chloride 104 98 - 111 mmol/L  ? CO2 25 22 - 32 mmol/L  ? Glucose, Bld 134 (H) 70 - 99 mg/dL  ?  Comment: Glucose reference range applies only to samples taken after fasting for at least 8 hours.  ? BUN 24 (H) 8 - 23 mg/dL  ? Creatinine, Ser 1.42 (H) 0.44 - 1.00 mg/dL  ? Calcium 8.6 (L) 8.9 - 10.3 mg/dL  ? Total Protein 7.8 6.5 - 8.1 g/dL  ? Albumin 3.0 (L) 3.5 - 5.0 g/dL  ? AST 17 15 - 41 U/L  ? ALT 10 0 - 44 U/L  ? Alkaline Phosphatase 59 38 - 126 U/L  ? Total Bilirubin 0.5 0.3 - 1.2 mg/dL  ? GFR, Estimated 38 (L) >60 mL/min  ?  Comment: (NOTE) ?Calculated using the CKD-EPI Creatinine Equation (2021) ?  ? Anion gap 9 5 - 15  ?  Comment: Performed at [facility], [address], [place], [place] [address]  ?CBC     Status: Abnormal  ? Collection Time: 03/08/22  4:16 PM  ?Result Value Ref Range  ? WBC 7.1 4.0 - 10.5 K/uL  ? RBC 3.37 (L) 3.87 - 5.11 MIL/uL  ? Hemoglobin 8.8 (L) 12.0 -  15.0 g/dL  ? HCT 29.4 (L) 36.0 - 46.0 %  ? MCV 87.2 80.0 - 100.0 fL  ? MCH 26.1 26.0 - 34.0 pg  ? MCHC 29.9 (L) 30.0 - 36.0 g/dL  ? RDW 16.5 (H) 11.5 - 15.5 %  ? Platelets 319 150 - 400 K/uL  ? nRBC 0.0 0.0 - 0.

## 2022-03-09 ENCOUNTER — Inpatient Hospital Stay: Payer: Medicare Other

## 2022-03-09 DIAGNOSIS — Z515 Encounter for palliative care: Secondary | ICD-10-CM | POA: Diagnosis not present

## 2022-03-09 DIAGNOSIS — F411 Generalized anxiety disorder: Secondary | ICD-10-CM

## 2022-03-09 DIAGNOSIS — F909 Attention-deficit hyperactivity disorder, unspecified type: Secondary | ICD-10-CM

## 2022-03-09 DIAGNOSIS — F330 Major depressive disorder, recurrent, mild: Secondary | ICD-10-CM | POA: Diagnosis present

## 2022-03-09 DIAGNOSIS — R4182 Altered mental status, unspecified: Secondary | ICD-10-CM | POA: Diagnosis not present

## 2022-03-09 DIAGNOSIS — F902 Attention-deficit hyperactivity disorder, combined type: Secondary | ICD-10-CM | POA: Diagnosis not present

## 2022-03-09 LAB — URINE DRUG SCREEN, QUALITATIVE (ARMC ONLY)
Amphetamines, Ur Screen: POSITIVE — AB
Barbiturates, Ur Screen: NOT DETECTED
Benzodiazepine, Ur Scrn: NOT DETECTED
Cannabinoid 50 Ng, Ur ~~LOC~~: NOT DETECTED
Cocaine Metabolite,Ur ~~LOC~~: NOT DETECTED
MDMA (Ecstasy)Ur Screen: NOT DETECTED
Methadone Scn, Ur: NOT DETECTED
Opiate, Ur Screen: NOT DETECTED
Phencyclidine (PCP) Ur S: NOT DETECTED
Tricyclic, Ur Screen: NOT DETECTED

## 2022-03-09 LAB — VITAMIN B12: Vitamin B-12: 354 pg/mL (ref 180–914)

## 2022-03-09 LAB — RESP PANEL BY RT-PCR (FLU A&B, COVID) ARPGX2
Influenza A by PCR: NEGATIVE
Influenza B by PCR: NEGATIVE
SARS Coronavirus 2 by RT PCR: NEGATIVE

## 2022-03-09 LAB — PHOSPHORUS: Phosphorus: 2.4 mg/dL — ABNORMAL LOW (ref 2.5–4.6)

## 2022-03-09 LAB — TSH: TSH: 3.604 u[IU]/mL (ref 0.350–4.500)

## 2022-03-09 MED ORDER — MEDIHONEY WOUND/BURN DRESSING EX PSTE
1.0000 "application " | PASTE | Freq: Every day | CUTANEOUS | Status: DC
  Administered 2022-03-09 – 2022-03-10 (×2): 1 via TOPICAL
  Filled 2022-03-09: qty 44

## 2022-03-09 MED ORDER — AMPHETAMINE-DEXTROAMPHET ER 30 MG PO CP24
30.0000 mg | ORAL_CAPSULE | Freq: Every day | ORAL | Status: DC
  Administered 2022-03-10: 30 mg via ORAL

## 2022-03-09 MED ORDER — SODIUM CHLORIDE 0.9% FLUSH: 10.0000 mL | INTRAVENOUS | Status: DC | PRN

## 2022-03-09 MED ORDER — DULOXETINE HCL 30 MG PO CPEP
30.0000 mg | ORAL_CAPSULE | Freq: Every day | ORAL | Status: DC
  Administered 2022-03-10: 30 mg via ORAL
  Filled 2022-03-09 (×2): qty 1

## 2022-03-09 MED ORDER — CHLORHEXIDINE GLUCONATE CLOTH 2 % EX PADS
6.0000 | MEDICATED_PAD | Freq: Every day | CUTANEOUS | Status: DC
  Administered 2022-03-09 – 2022-03-10 (×2): 6 via TOPICAL

## 2022-03-09 MED ORDER — GABAPENTIN 100 MG PO CAPS: 100.0000 mg | ORAL_CAPSULE | Freq: Two times a day (BID) | ORAL | Status: DC

## 2022-03-09 MED ORDER — SODIUM CHLORIDE 0.9% FLUSH
10.0000 mL | Freq: Two times a day (BID) | INTRAVENOUS | Status: DC
  Administered 2022-03-09 – 2022-03-10 (×2): 10 mL

## 2022-03-09 MED ORDER — RIVAROXABAN 20 MG PO TABS
20.0000 mg | ORAL_TABLET | Freq: Every day | ORAL | Status: DC
Start: 2022-03-09 — End: 2022-03-09

## 2022-03-09 MED ORDER — AMPHETAMINE-DEXTROAMPHET ER 30 MG PO CP24
30.0000 mg | ORAL_CAPSULE | Freq: Every day | ORAL | Status: DC
  Filled 2022-03-09: qty 3

## 2022-03-09 MED ORDER — RIVAROXABAN 15 MG PO TABS
15.0000 mg | ORAL_TABLET | Freq: Every day | ORAL | Status: DC
  Administered 2022-03-09: 15 mg via ORAL
  Filled 2022-03-09 (×2): qty 1

## 2022-03-09 MED ORDER — LISINOPRIL 20 MG PO TABS
20.0000 mg | ORAL_TABLET | Freq: Every day | ORAL | Status: DC
  Administered 2022-03-09 – 2022-03-10 (×2): 20 mg via ORAL
  Filled 2022-03-09: qty 1
  Filled 2022-03-09: qty 2

## 2022-03-09 MED ORDER — CYANOCOBALAMIN 1000 MCG/ML IJ SOLN
1000.0000 ug | Freq: Once | INTRAMUSCULAR | Status: DC
  Filled 2022-03-09: qty 1

## 2022-03-09 NOTE — ED Notes (Signed)
This RN to bedside for pt blood draw, pt. States she does not want an IV, this RN explains she can do just a blood draw. Pt. States she does not want any more blood draws, this RN explains reasoning for blood cultures and repeat blood. Pt. Agrees.  ?This RN offered pt. Her prn pain med, pt. Refused. ?

## 2022-03-09 NOTE — ED Notes (Signed)
Pt able to get to [facility] with minimal assistance. ?

## 2022-03-09 NOTE — ED Notes (Signed)
[place] NP at bedside, speaking with family. ?

## 2022-03-09 NOTE — ED Notes (Addendum)
This RN to bedside per pt. Request. This RN discussed medihoney with pt. And pt. Agreed. This RN explained to pt. That she had inpatient room/ bed available. Pt. Had previously told this RN that she was ok with being admitted to inpatient room, however, she became tearful, angry, stating that she did not want to be admitted, but couldn't leave because insurance would not pay her hospital bill. This RN sympathized with pt. Encouraged pt. To discuss with hospitalist, and social work when they come to see her. Pt. Peacefully, and calmly Agreed, and stated this RN could put her belongings in a hospital bag for transport. While gathering pt's belongings she became angry again and stated, "I want to see social work down here, before I go up to the room" This RN explained that social work would see her in her inpatient room, pt responded, "I can't do this anymore, I don't want to go up there, I just want to die, I'm going to kill myself... Now you have to tell someone I said that don't you?" "I'm just going to leave!" Pt. Began to gather her belongings. ?This RN requested charge RN, and MD to bedside. Charge RN and this RN discussed plan of care with pt. Pt. Verbalized frustration that she was dropped off by her daughter and she feels that she has no control over what is happening to her. Charge RN empathized, discussed with pt. That she did not have a place to be safely discharged.  ?MD to bedside to discuss poc with pt.  ?MD notified by this RN, outside of room about pt's verbalized SI. MD states transport pt. To floor and IVC. ?This Journalist, newspaper on. ?

## 2022-03-09 NOTE — Consult Note (Signed)
WOC Nurse Consult Note: ?Patient receiving care in [facility] ED 38. ?Reason for Consult: "chronic venous stasis ulcers left leg'' ?Wound type: documented as venous stasis. However, I suspect these could have an arterial insufficiency component contributing. ?Pressure Injury POA: Yes/No/NA ?Measurement: I could not get a measurement due to the patient's moving the LLE. It is circumferential in nature with the lateral, posterior area being the deepest.  The wounds have a punched out appearance as well. ?Wound bed: pink with some scattered yellow on the lateral/posterior aspect ?Drainage (amount, consistency, odor) none ?Periwound: slightly erythematous ?Dressing procedure/placement/frequency: ?Apply Medihoney to the circumferential wound on the LLE. Place non-adherent pads, such as Telfa, over the areas. Secure with kerlix. Change daily. ? ?Monitor the wound area(s) for worsening of condition such as: ?Signs/symptoms of infection,  ?Increase in size,  ?Development of or worsening of odor, ?Development of pain, or increased pain at the affected locations.  Notify the medical team if any of these develop. ? ?Thank you for the consult.  Discussed plan of care with the patient and bedside nurse.  [place] nurse will not follow at this time.  Please re-consult the [place] team if needed. ? ?Val Riles, RN, MSN, CWOCN, CNS-BC, pager [phone]  ? ?  ?

## 2022-03-09 NOTE — Consult Note (Signed)
? ?  ?Palliative Medicine ?[facility] ?[phone] [phone] ? ? ?Name: Laura Santiago ?Date: 03/09/2022 ?MRN: [id number]  ?DOB: 01-14-1946 ? ?Patient Care Team: ?[facility] as PCP - General ?[name], MD as Consulting Physician (Hematology and Oncology)  ? ? ?REASON FOR CONSULTATION: ?Laura Santiago is a 76 y.o. 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 Patient has known widespread skeletal metastases.  She has previous RT to hip.  Patient has history of pathologic spinal fractures.  Patient was admitted to the hospital on 03/08/2022 with altered mental status with reports that she had been aggressive and confused with caregivers at home.  Etiology of altered mental status unclear but concern for worsening underlying psychiatric issues.  Palliative care was consulted up address. ? ?SOCIAL HISTORY:    ? reports that she has never smoked. She has never used smokeless tobacco. She reports that she does not currently use alcohol. She reports current drug use. ? ?Patient lives at [facility]  She has a daughter who is involved. ? ?ADVANCE DIRECTIVES:  ?On file ? ?CODE STATUS: DNR ? ?PAST MEDICAL HISTORY: ?Past Medical History:  ?Diagnosis Date  ? ADHD (attention deficit hyperactivity disorder)   ? in [place], no date on md note  ? Anemia   ? IDA 11/26/2019, Anemia in stage 4 chronic kidney disease ([place]) 09/03/2020  ? Arthritis   ? osteoarthritis right knee 09/30/2014  ? Breast cancer ([place]) 10/05/2013  ? in [place], [place] -, Her 2 is 3+  ? Cancer related pain 03/28/2021  ? in [name], md notes spine mets  ? cervical compression fracture 03/18/2021  ? in [place]  ? DVT of lower extremity, bilateral ([place]) 03/30/2014  ? in [name]  ? Generalized muscle weakness 03/31/2016  ? in [name]  ?  GERD (gastroesophageal reflux disease) 05/15/2013  ? per md in [name]  ? Hyperparathyroidism, secondary ([place]) 04/25/2018  ? in [name]  ? Hypertension 02/20/2016  ? info from MD in [name]  ? Memory loss 02/05/2015  ? in [name]  ? Metabolic acidosis 04/20/210  ? in [place]  ? Metabolic syndrome 17/35/6701  ? in [name]  ? Osteopenia after menopause 03/29/2016  ? in [name]  ? 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 in [name]  ? Stasis ulcer of left lower extremity ([place]) 03/29/2016  ? in [name]  ? ? ?PAST SURGICAL HISTORY:  ?Past Surgical History:  ?Procedure Laterality Date  ? CESAREAN SECTION    ? unknown  ? fibroid removed  N/A   ? in [place] - unknown date  ? IR FLUORO GUIDE CV LINE LEFT  07/27/2021  ? IR PORT REPAIR CENTRAL VENOUS ACCESS DEVICE Left   ? In [name]  ? MOHS SURGERY N/A 02/25/2014  ? in [name]  ? ovary removed     ? unknown  ? PORTA CATH INSERTION N/A 02/13/2022  ? Procedure: PORTA CATH INSERTION;  Surgeon: Algernon Huxley, MD;  Location: [facility];  Service: Cardiovascular;  Laterality: N/A;  ? [place] CATARACT EXTRACAP,INSERT LENS Bilateral  Bilateral 09/05/2012  ? in [place]    ? unknown  ?  LUMPECTOMY Right 04/03/2014  ? in [place]  ? ? ?HEMATOLOGY/ONCOLOGY HISTORY:  ?Oncology History  ?Metastatic breast cancer ([place])  ?2014 Initial Diagnosis  ?  history of breast cancer diagnosed in 2014 s/p chemotherapy with Herceptin and lumpectomy, radiation and antiestrogen therapy that was completed in 2020 (in [name]) ?  ?03/18/2021 - 03/24/2021 Hospital Admission  ? New neck and shoulder pain: Scans revealed lytic lesions C5-C6 with pathological compression fractures (developed steroid-induced mania) status post 1 round of radiation (Dr. Sondra Come in [place] was her medical oncologist) ?  ?04/19/2021 Relapse/Recurrence  ? Biopsy of L1 vertebral body on 04/19/21 showed metastatic carcinoma consistent with breast origin involving bone: ER 10%, PR 0%, HER2 ratio 5.1) ?  ?05/06/2021 -  Chemotherapy  ? Taxol (weekly) Herceptin Perjeta  palliative chemotherapy started in [name], moved to be closer to her family in [place] ? ?  ? ?  ?06/28/2021 Cancer Staging  ? Staging form: Breast, AJCC 8th Edition ?- Clinical: Stage IV (pM1, ER+, PR-, HER2+) - Signed by [name], MD on 06/28/2021 ? ?  ?07/01/2021 -  Chemotherapy  ? Patient is on Treatment Plan : BREAST Weekly Paclitaxel + Trastuzumab + Pertuzumab q21d x 8 cycles / Trastuzumab + Pertuzumab q21d x 4 cycles  ?   ? ? ?ALLERGIES:  is allergic to corticosteroids, sulfa antibiotics, and celebrex [celecoxib]. ? ?MEDICATIONS:  ?Current Facility-Administered Medications  ?Medication Dose Route Frequency Provider Last Rate Last Admin  ? acetaminophen (TYLENOL) tablet 650 mg  650 mg Oral Q6H PRN Athena Masse, MD      ? Or  ? acetaminophen (TYLENOL) suppository 650 mg  650 mg Rectal Q6H PRN Athena Masse, MD      ? DULoxetine (CYMBALTA) DR capsule 30 mg  30 mg Oral Daily Wouk, Ailene Rud, MD      ? fentaNYL (DURAGESIC) 25 MCG/HR 1 patch  1 patch Transdermal Q72H Athena Masse, MD   1 patch at 03/08/22 2245  ? leptospermum manuka honey (MEDIHONEY) paste 1 application.  1 application. Topical Daily Wouk, Ailene Rud, MD   1 application. at 03/09/22 1430  ? lisinopril (ZESTRIL) tablet 20 mg  20 mg Oral Daily Gwynne Edinger, MD   20 mg at 03/09/22 1127  ? ondansetron (ZOFRAN) tablet 4 mg  4 mg Oral Q6H PRN Athena Masse, MD      ? Or  ? ondansetron [facility]) injection 4 mg  4 mg Intravenous Q6H PRN Athena Masse, MD      ? oxyCODONE (Oxy IR/ROXICODONE) immediate release tablet 10 mg  10 mg Oral Q4H PRN Athena Masse, MD   10 mg at 03/09/22 1126  ? Rivaroxaban (XARELTO) tablet 15 mg  15 mg Oral Q supper Pearla Dubonnet, RPH      ? senna-docusate (Senokot-S) tablet 1 tablet  1 tablet Oral QHS PRN Athena Masse, MD      ? ?Facility-Administered Medications Ordered in Other Encounters  ?Medication Dose Route Frequency Provider Last Rate Last Admin  ? heparin lock flush 100 UNIT/ML injection            ? sodium chloride flush (NS) 0.9 % injection 10 mL  10 mL Intracatheter PRN [name], MD      ? ? ?VITAL SIGNS: ?BP (!) 163/93   Pulse 90   Temp 98.9 ?F (37.2 ?C) (Oral)   Resp 20   Wt 152 lb (68.9 kg)   SpO2 100%   BMI 25.29 kg/m?  ?Filed Weights  ? 03/08/22 1614  ?Weight: 152 lb (68.9 kg)  ?  ?Estimated body mass index is 25.29 kg/m? as calculated from the  following: ?  Height as of 03/02/22: _0  (1.651 m). ?  Weight as of this encounter: 152 lb (68.9 kg). ? ?LABS: ?CBC: ?   ?Component Value Date/Time  ? WBC 7.1 03/08/2022 1616  ? HGB 8.8 (L) 03/08/2022 1616  ? HCT 29.4 (L) 03/08/2022 1616  ? PLT 319 03/08/2022 1616  ? MCV 87.2 03/08/2022 1616  ? NEUTROABS 4.3 01/31/2022 1256  ? LYMPHSABS 0.6 (L) 01/31/2022 1256  ? MONOABS 0.3 01/31/2022 1256  ? EOSABS 0.1 01/31/2022 1256  ? BASOSABS 0.0 01/31/2022 1256  ? ?Comprehensive Metabolic Panel: ?   ?Component Value Date/Time  ? NA 138 03/08/2022 1616  ? K 4.2 03/08/2022 1616  ? CL 104 03/08/2022 1616  ? CO2 25 03/08/2022 1616  ? BUN 24 (H) 03/08/2022 1616  ? CREATININE 1.42 (H) 03/08/2022 1616  ? GLUCOSE 134 (H) 03/08/2022 1616  ? CALCIUM 8.6 (L) 03/08/2022 1616  ? AST 17 03/08/2022 1616  ? ALT 10 03/08/2022 1616  ? ALKPHOS 59 03/08/2022 1616  ? BILITOT 0.5 03/08/2022 1616  ? PROT 7.8 03/08/2022 1616  ? ALBUMIN 3.0 (L) 03/08/2022 1616  ? ? ?RADIOGRAPHIC STUDIES: ?DG Thoracic Spine 2 View ? ?Result Date: 02/24/2022 ?CLINICAL DATA:  Mid back pain. EXAM: THORACIC SPINE 2 VIEWS COMPARISON:  None. FINDINGS: There is no evidence of an acute thoracic spine fracture. Exaggerated kyphosis of the thoracic spine is seen with mild levoscoliosis of the visualized portion of the lumbar spine. There is marked severity multilevel endplate sclerosis with associated moderate severity lateral osteophyte formation. Moderate to marked severity multilevel intervertebral disc space narrowing is seen. No other significant bone abnormalities are identified. A left-sided  venous Port-A-Cath is in place. IMPRESSION: 1. Advanced multilevel degenerative changes throughout the thoracic spine with no evidence of an acute thoracic spine fracture. 2. Exaggerated kyphosis of the thoracic s

## 2022-03-09 NOTE — Consult Note (Signed)
ANTICOAGULATION CONSULT NOTE - Initial Consult ? ?Pharmacy Consult for Rivaroxaban ?Indication:  History of DVT ? ?Allergies  ?Allergen Reactions  ? Corticosteroids Other (See Comments)  ?  Pt trf from [name] and per primary md for her cancer tx. Notes that it causes agitation intolerance  ? Sulfa Antibiotics Other (See Comments)  ?  Pt moved from [name] and in MD notes she has allergy but we do not know reactions when taking the drug  ? Celebrex [Celecoxib] Rash  ? ? ?Patient Measurements: ?Weight: 68.9 kg (152 lb) ? ?Vital Signs: ?Temp: 98.7 ?F (37.1 ?C) (03/29 2200) ?Temp Source: Oral (03/29 2200) ?BP: 142/79 (03/30 0656) ?Pulse Rate: 78 (03/30 0656) ? ?Labs: ?Recent Labs  ?  03/08/22 ?1616  ?HGB 8.8*  ?HCT 29.4*  ?PLT 319  ?LABPROT 15.4*  ?INR 1.2  ?CREATININE 1.42*  ? ? ?Estimated Creatinine Clearance: 32.4 mL/min (A) (by C-G formula based on SCr of 1.42 mg/dL (H)). ? ? ?Medical History: ?Past Medical History:  ?Diagnosis Date  ? ADHD (attention deficit hyperactivity disorder)   ? in [place], no date on md note  ? Anemia   ? IDA 11/26/2019, Anemia in stage 4 chronic kidney disease ([place]) 09/03/2020  ? Arthritis   ? osteoarthritis right knee 09/30/2014  ? Breast cancer ([place]) 10/05/2013  ? in [place] -, Her 2 is 3+  ? Cancer related pain 03/28/2021  ? in [name], md notes spine mets  ? cervical compression fracture 03/18/2021  ? in [place]  ? DVT of lower extremity, bilateral ([place]) 03/30/2014  ? in [name]  ? Generalized muscle weakness 03/31/2016  ? in [name]  ? GERD (gastroesophageal reflux disease) 05/15/2013  ? per md in [name]  ? Hyperparathyroidism, secondary ([place]) 04/25/2018  ? in [name]  ? Hypertension 02/20/2016  ? info from MD in [name]  ? Memory loss 02/05/2015  ? in [name]  ? Metabolic acidosis 50/35/4656  ? in [place]  ? Metabolic syndrome 81/27/5170  ? in [name]  ? Osteopenia after menopause 03/29/2016  ? in [name]  ? 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 in [name]  ? Stasis ulcer of left lower extremity ([place]) 03/29/2016  ?  in [name]  ? ? ?Medications:  ?(Not in a hospital admission)  ?Scheduled:  ? amphetamine-dextroamphetamine  30 mg Oral Daily  ? DULoxetine  30 mg Oral Daily  ? fentaNYL  1 patch Transdermal Q72H  ? gabapentin  100 mg Oral BID  ? lisinopril  20 mg Oral Daily  ? rivaroxaban  15 mg Oral Q supper  ? ?Infusions:  ?PRN: acetaminophen **OR** acetaminophen, ondansetron **OR** ondansetron (ZOFRAN) IV, oxyCODONE, senna-docusate ?Anti-infectives (From admission, onward)  ? ? None  ? ?  ? ? ?Assessment: ?Pharmacy has been consulted to re-initiate rivaroxaban dosing for 75yo patient with history of DVT. Patient was taking '20mg'$  daily PTA.  ? ?Goal of Therapy:  ?Monitor platelets by anticoagulation protocol: Yes ?  ?Plan:  ?Rivaroxaban 15 mg daily(using lower dose d/t CrCl between 15-50 ml/min) ?Will CTM and adjust dose as appropriate ?CBC daily ? ?Laura Santiago A Laura Santiago ?03/09/2022,8:17 AM ? ? ?

## 2022-03-09 NOTE — Consult Note (Signed)
[facility] Face-to-Face Psychiatry Consult  ? ?Reason for Consult: Consult for 76 year old woman dropped off at the hospital last night under somewhat unclear circumstances for allegedly "altered mental status".  Apparently nursing staff had found it difficult to work with the patient today. ?Referring Physician:  Charolotte Eke ?Patient Identification: Laura Santiago ?MRN:  [id number] ?Principal Diagnosis: ADHD (attention deficit hyperactivity disorder) ?Diagnosis:  Principal Problem: ?  ADHD (attention deficit hyperactivity disorder) ?Active Problems: ?  Metastatic breast cancer ([place]) ?  Anemia in stage 3b chronic kidney disease ([place]) ?  Essential hypertension ?  Memory loss ?  Physical deconditioning ?  Stage 3b chronic kidney disease ([place]) ?  Peripheral neuropathy due to chemotherapy [facility]) ?  Personal history of DVT (deep vein thrombosis) ?  Altered mental status, unspecified ?  Altered mental status ?  Major depressive disorder, recurrent episode, mild ([place]) ?  Palliative care encounter ? ? ?Total Time spent with patient: 1 hour ? ?Subjective:   ?Laura Santiago is a 76 y.o. female patient admitted with "well it was not my idea". ? ?HPI: Patient seen and chart reviewed.  76 year old woman was dropped off at the emergency room yesterday evening by her daughter with vague complaints on the daughter's part of there being altered mental status.  I have not been able to reach the daughter on the telephone.  Apparently she spoke with the emergency room doctor and said that the patient has been having behavior problems that have somehow made her unmanageable at her assisted living facility.  The patient tells me that as far as she was concerned there was no reason at all for her to come to the emergency room and she definitely disagrees with being admitted to the hospital.  She says that she just moved to [place] about 2 months ago after living for years in [name].  She lived independently out there and now is in an  assisted living facility.  She came to [place] because her brothers in [name] died and her daughter lives here in [place].  Since being here the patient says that as far as she is concerned she has been doing all right but that she has gotten the impression from her daughter that her daughter thinks it is too much work to help her.  She says that her daughter has been lobbying her for quite a while to try to move into a nursing home because the daughter feels it is too much of a burden to Take Care of the patient.  Patient claims that the daughter tried to have an ambulance called to bring the patient to the hospital because they "thought that if I came by ambulance I would get her room more quickly" but that the patient declined and that that led to some sort of disagreement.  Patient says as far as she is concerned her chief complaint and only medical issue that she would like addressed in the hospital is her chronic leg wound.  She has had a wound on her lower leg that has been coming and going for about 10 years.  She says she is frustrated about it and would like to have a physician take a look at it and answer her questions.  Patient denies mood symptoms.  Denies depression.  Denies hopelessness.  She says that she sleeps adequately at night.  She eats fine.  She denies ever having any hallucinations.  Denies any feelings of paranoia.  Denies any hostility or anger.  She  acknowledges that she refused to go to her MRI scan earlier today.  She says that she did that because she has found that refusing specific treatments is often the only way to get doctors attention so that they will actually talk with her.  She does not believe that she needs to have her head MRI head because as I mentioned she thinks her only medical issue is her leg.  I explored with her the comment that one of the nurses made that the patient had been "obsessed" with some grass and dirt on the floor of her room thinking it had  something to do with her wound.  The patient had no idea whatsoever what I was talking about denied having any such thoughts. ? ?Past Psychiatric History: Patient was diagnosed with ADHD in her 50s because of longstanding problems with lack of focus and disorganization.  She has been receiving treatment with Adderall extended release 30 mg once a day for many years.  She says it is highly effective and that she has no history of abusing it.  Reviewing some of her old notes from [place] this appears to be correct.  Denies ever being admitted to a psychiatric hospital.  Denies any history of suicide self-injury aggression violence or psychosis.  Patient does say that she has noticed a little bit of memory problems but not severe. ? ?Risk to Self:   ?Risk to Others:   ?Prior Inpatient Therapy:   ?Prior Outpatient Therapy:   ? ?Past Medical History:  ?Past Medical History:  ?Diagnosis Date  ? ADHD (attention deficit hyperactivity disorder)   ? in [place], no date on md note  ? Anemia   ? IDA 11/26/2019, Anemia in stage 4 chronic kidney disease ([place]) 09/03/2020  ? Arthritis   ? osteoarthritis right knee 09/30/2014  ? Breast cancer ([place]) 10/05/2013  ? in [place], [place] -, Her 2 is 3+  ? Cancer related pain 03/28/2021  ? in [name], md notes spine mets  ? cervical compression fracture 03/18/2021  ? in [place]  ? DVT of lower extremity, bilateral ([place]) 03/30/2014  ? in [name]  ? Generalized muscle weakness 03/31/2016  ? in [name]  ? GERD (gastroesophageal reflux disease) 05/15/2013  ? per md in [name]  ? Hyperparathyroidism, secondary ([place]) 04/25/2018  ? in [name]  ? Hypertension 02/20/2016  ? info from MD in [name]  ? Memory loss 02/05/2015  ? in [name]  ? Metabolic acidosis 15/72/6203  ? in [place]  ? Metabolic syndrome 55/97/4163  ? in [name]  ? Osteopenia after menopause 03/29/2016  ? in [name]  ? 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 in [name]  ? Stasis ulcer of left lower extremity ([place]) 03/29/2016  ? in [name]  ?  ?Past Surgical History:  ?Procedure  Laterality Date  ? CESAREAN SECTION    ? unknown  ? fibroid removed  N/A   ? in [place] - unknown date  ? IR FLUORO GUIDE CV LINE LEFT  07/27/2021  ? IR PORT REPAIR CENTRAL VENOUS ACCESS DEVICE Left   ? In [name]  ? MOHS SURGERY N/A 02/25/2014  ? in [name]  ? ovary removed     ? unknown  ? PORTA CATH INSERTION N/A 02/13/2022  ? Procedure: PORTA CATH INSERTION;  Surgeon: Algernon Huxley, MD;  Location: [facility];  Service: Cardiovascular;  Laterality: N/A;  ? [place] CATARACT EXTRACAP,INSERT LENS Bilateral  Bilateral 09/05/2012  ? in [place]    ?  unknown  ?  LUMPECTOMY Right 04/03/2014  ? in [place]  ? ?Family History:  ?Family History  ?Problem Relation Age of Onset  ? Pancreatic cancer Mother   ? Stroke Father   ? Diabetes Father   ? Hypertension Father   ? Heart disease Father   ? Skin cancer Father   ? Varicose Veins Father   ? Skin cancer Brother   ? Cancer - Prostate Brother   ? ?Family Psychiatric  History: Patient had a brother who committed suicide by gunshot recently.  She says that that brother had a longstanding substance abuse problem ?Social History:  ?Social History  ? ?Substance and Sexual Activity  ?Alcohol Use Not Currently  ?   ?Social History  ? ?Substance and Sexual Activity  ?Drug Use Yes  ? Comment: prescribed oxy, fentanyl patch  ?  ?Social History  ? ?Socioeconomic History  ? Marital status: Divorced  ?  Spouse name: Not on file  ? Number of children: Not on file  ? Years of education: Not on file  ? Highest education level: Not on file  ?Occupational History  ? Occupation: retired Teacher, music  ?  Comment: In [place]  ?Tobacco Use  ? Smoking status: Never  ? Smokeless tobacco: Never  ?Vaping Use  ? Vaping Use: Never used  ?Substance and Sexual Activity  ? Alcohol use: Not Currently  ? Drug use: Yes  ?  Comment: prescribed oxy, fentanyl patch  ? Sexual activity: Not Currently  ?Other Topics Concern  ? Not on file  ?Social History Narrative  ? Not on file  ? ?Social  Determinants of Health  ? ?Financial Resource Strain: Not on file  ?Food Insecurity: Not on file  ?Transportation Needs: Not on file  ?Physical Activity: Not on file  ?Stress: Not on file  ?Social Connections: No

## 2022-03-09 NOTE — ED Notes (Signed)
Patient refusing to let this RN place IV. Reports unless someone comes and looks at her leg and "acknowledges the green and black specks she found from wound, then she will not allow a MRI of her head." Patient reports she needs a MRI of her leg ?

## 2022-03-09 NOTE — ED Notes (Signed)
Pt refusing VS at this time.

## 2022-03-09 NOTE — TOC Initial Note (Signed)
Transition of Care (TOC) - Initial/Assessment Note  ? ? ?Patient Details  ?Name: Laura Santiago ?MRN: [id number] ?Date of Birth: 10-May-1946 ? ?Transition of Care (TOC) CM/SW Contact:    ?Shelbie Hutching, RN ?Phone Number: ?03/09/2022, 2:25 PM ? ?Clinical Narrative:                 ?Patient being admitted to the hospital for altered mental status.  Patient was recently on 3/23 seen in the ED and discharged back to [facility] with Home health services.   ? ?Patient lives at [facility] in an apartment, Rollene Fare from [facility] called to express concerns about patient that she is no longer really appropriate for them, mostly because she is non compliant and is not going to her appointments, and she got aggressive with a caregiver the other day.  Per Rollene Fare patient has missed several wound care visits so there is no telling when the dressing on her leg was last changed.  Patient was open with [facility], Sharmon Revere notified that patient being admitted.   ?Patient frequently calls for help to her daughter.  Daughter brought her to the ED.  Discussed briefly last visit about transition to ALF and they expressed interest in [facility] [place].  Rollene Fare reports that Nanine Means was supposed to come and assess the patient today, Rollene Fare will update Nanine Means that she is in the hospital so they can come here to assess her. ?Rollene Fare reports that they are not kicking her out of [facility] and that they would like to help with the transition to ALF. ?Rollene Fare with [phone]. ? ?TOC will follow. ? ? ? ?Expected Discharge Plan: Assisted Living (TBD) ?Barriers to Discharge: Continued Medical Work up ? ? ?Patient Goals and CMS Choice ?  ?  ?  ? ?Expected Discharge Plan and Services ?Expected Discharge Plan: Assisted Living (TBD) ?  ?Discharge Planning Services: CM Consult ?  ?Living arrangements for the past 2 months: [place] ?                ?DME Arranged: N/A ?DME Agency: NA ?  ?  ?  ?HH Arranged: RN, PT, OT, Nurse's  Aide, Social Work ?[place] Agency: [place] ?Date HH Agency Contacted: 03/09/22 ?Time [place]: 8250 ?Representative spoke with at [place]: Sharmon Revere ? ?Prior Living Arrangements/Services ?Living arrangements for the past 2 months: [place] ?Lives with:: Self ?Patient language and need for interpreter reviewed:: Yes ?Do you feel safe going back to the place where you live?: No   [facility] has concerns about patient being appropriate for IL- really needs ALF  ?Need for Family Participation in Patient Care: Yes (Comment) ?Care giver support system in place?: Yes (comment) (daughter) ?Current home services: DME (rollator) ?Criminal Activity/Legal Involvement Pertinent to Current Situation/Hospitalization: No - Comment as needed ? ?Activities of Daily Living ?Home Assistive Devices/Equipment: [name] (specify type), Eyeglasses, Grab bars in shower, Shower chair without back ?ADL Screening (condition at time of admission) ?Patient's cognitive ability adequate to safely complete daily activities?: Yes ?Is the patient deaf or have difficulty hearing?: No ?Does the patient have difficulty seeing, even when wearing glasses/contacts?: No ?Does the patient have difficulty concentrating, remembering, or making decisions?: No ?Patient able to express need for assistance with ADLs?: Yes ?Does the patient have difficulty dressing or bathing?: No ?Independently performs ADLs?: Yes (appropriate for developmental age) ?Does the patient have difficulty walking or climbing stairs?: Yes ?Weakness of Legs: Left ?Weakness of Arms/Hands:  None ? ?Permission Sought/Granted ?Permission sought to share information with : Case Manager, Family Supports, Customer service manager ?Permission granted to share information with : Yes, Verbal Permission Granted ? Share Information with NAME: [name] ? Permission granted to share info w AGENCY: [facility] ? Permission granted to share info  w Relationship: daughter ? Permission granted to share info w Contact Information: [phone] ? ?Emotional Assessment ?Appearance:: Appears stated age ?  ?  ?  ?Alcohol / Substance Use: Not Applicable ?Psych Involvement: Yes (comment) ? ?Admission diagnosis:  Altered mental status [R41.82] ?Patient Active Problem List  ? Diagnosis Date Noted  ? ADHD (attention deficit hyperactivity disorder) 03/09/2022  ? Major depressive disorder, recurrent episode, mild ([place]) 03/09/2022  ? Stage 3b chronic kidney disease ([place]) 03/08/2022  ? Venous stasis ulcer of left lower leg with edema of left lower leg ([place]) 03/08/2022  ? Altered mental status, unspecified 03/08/2022  ? Altered mental status 03/08/2022  ? GAD (generalized anxiety disorder) 02/22/2022  ? Peripheral neuropathy due to chemotherapy ([place]) 02/22/2022  ? Personal history of DVT (deep vein thrombosis) 02/22/2022  ? SIRS (systemic inflammatory response syndrome) ([place]) 12/02/2021  ? Venous stasis ulcer with fat layer exposed ([place]) 08/29/2021  ? Tinea pedis of both feet 08/29/2021  ? Cellulitis of right lower extremity 08/29/2021  ? Metastatic breast cancer ([place]) 05/26/2021  ? Back pain 05/17/2021  ? 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 Malignant neoplasm metastatic to bone ([place]) 04/27/2021  ? Primary insomnia 03/28/2021  ? Unsteady gait 03/28/2021  ? Lumbar compression fracture, open, initial encounter ([place]) 03/18/2021  ? Posterior vitreous detachment of both eyes 02/08/2021  ? Anemia in stage 3b chronic kidney disease ([place]) 09/03/2020  ? AKI (acute kidney injury) ([place]) 06/26/2020  ? Bacteriuria 11/26/2019  ? Iron deficiency anemia 11/26/2019  ? Venous stasis ulcer of left calf limited to breakdown of skin with varicose veins ([place]) 10/22/2018  ? Myopia of both eyes with astigmatism and presbyopia 05/27/2018  ? 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 Hyperparathyroidism, secondary ([place])  04/25/2018  ? Lactic acidosis 01/27/2018  ? Generalized muscle weakness 03/31/2016  ? Attention deficit hyperactivity disorder (ADHD), predominantly inattentive type 03/29/2016  ? Osteopenia 03/29/2016  ? Essential hypertension 02/20/2016  ? Metabolic syndrome 62/83/6629  ? Chronic anticoagulation 11/16/2015  ? Right leg swelling 11/16/2015  ? Memory loss 02/05/2015  ? Atrophy of muscle of multiple sites 09/30/2014  ? Physical deconditioning 09/30/2014  ? Primary osteoarthritis of right knee 09/30/2014  ? History of breast cancer in female 09/14/2014  ? History of radiation therapy 09/14/2014  ? Acute embolism and thrombosis of unspecified deep veins of lower extremity, bilateral ([place]) 03/30/2014  ? Lymphadenopathy, inguinal 08/28/2013  ? Venous ulcer of ankle, left ([place]) 08/04/2013  ? GERD (gastroesophageal reflux disease) 06/02/2013  ? Personal history of other venous thrombosis and embolism 12/12/1999  ? ?PCP:  [place] ?Pharmacy:   ?[facility] - [employer] [place] ?[address] ?[place] [address] ?Phone: [phone] Fax: [phone] ? ? ? ? ?Social Determinants of Health (SDOH) Interventions ?  ? ?Readmission Risk Interventions ?   ? View : No data to display.  ?  ?  ?  ? ? ? ?

## 2022-03-09 NOTE — ED Notes (Signed)
Wound to LLE cleaned with sterile water and dressed with vaseline gauze, ABD pad, and kerlix per patient request.  ?

## 2022-03-09 NOTE — ED Notes (Signed)
This RN to bedside, MRI tech speaking with pt. Pt. Is tearful and states she would like to speak with RN alone. MRI tech steps out of room. ?Pt. Tells this RN that she has decided, "I'm going to refuse it all." When this RN asks pt. If she is speaking of MRI, or admission, the pt. States, "All of it, but I can't, if I do insurance won't pay for it, but I don't want to do this." This RN validates pt feelings, uses active listening skills, and encourages pt, while explaining reason for testing. Pt. Begins crying and states she wants to speak with psych, or social work or both. This RN offers to contact physician to request consults and recommends completing MRI in the meantime. Pt. States she will not have any more testing done, or be admitted to the floor until she has spoken with social work and [employer]. This RN offers pt. Her prn pain medication, pt. Agrees. ?This conversation communicated to Dr. Si Raider. Awaiting response. ?

## 2022-03-09 NOTE — Progress Notes (Addendum)
?PROGRESS NOTE ? ? ? ?Laura Santiago  LZJ:[id number] DOB: 1946/11/11 DOA: 03/08/2022 ?PCP: [facility]  ?Outpatient Specialists: oncology ? ? ? ?Brief Narrative:  ?From admission h and p ? Laura Santiago is a 76 y.o. 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  Patient was evaluated in the ED on 3/23 with a complaint of weakness, with difficulty getting around the facility and inadequate ability to care for self.  Work-up in the ED at that time was unremarkable and she was discharged back to the facility however over the course of the past few days daughter reports increasing concern for her behavior. ?ED course: Temperature 99 with otherwise normal vitals.  WBC 7100, hemoglobin at baseline at 8.8.  CMP with BUN/creatinine of 24/1.42, essentially unchanged from recent visit.  Urinalysis unremarkable.  CT head showed findings consistent with microvascular disease changes but no acute intracranial abnormality ?Hospitalist consulted for admission  ? ? ?Assessment & Plan: ?  ?Principal Problem: ?  Altered mental status, unspecified ?Active Problems: ?  Metastatic breast cancer ([place]) ?  Anemia in stage 3b chronic kidney disease ([place]) ?  Essential hypertension ?  Memory loss ?  Physical deconditioning ?  Stage 3b chronic kidney disease ([place]) ?  Peripheral neuropathy due to chemotherapy [facility]) ?  Personal history of DVT (deep vein thrombosis) ?  Altered mental status ?  ADHD (attention deficit hyperactivity disorder) ? ?# Altered mental status ?# Mental  illness ?Patient is dissheveled and speech is pressured. She is alert and oriented, does not appear encephalopathic. She carries a hx of adhd and is on adderall and duloxetine. Daughter says has also received personality disorder diagnosis. CT head neg, no signs infection, non-focal neuro exam.  No mets to brain on December mri. Daughter says her mother has longstanding mental health problems that have been worsening. ?- psychiatry consulted, will appreciate their recs as presentation appears to be mental health related ?- will f/u tsh, b12, blood and urine culture, urine drug screen ?- I have involuntarily committed the patient pending formal psych eval. She has threatened to leave and daughter says she is unable to care navigate safely by herself, she does not understand why she is here nor does she appear to understand the consequences of leaving AMA ? ?# Stage 4 metastatic breast cancer ?- with mets to bone, followed by onc ?- have alerted Dr. Janese Banks patient is admitted ?- followed by palliative ? ?# Hx DVT ?- cont home rivaroxaban ? ?# Chronic pain ?Cont home fentanyl, oxy, gabapentin ? ?# ADHD ?- home adderall on hold ?- psych consult as above ? ?# Chronic venous stasis ulcer ?Left leg. Followed by wound as outpt. Does not appear infected but is deep ?- wound care consult ? ?# HTN ?Here normotensive ?- cont home lisinopril ? ?# CKD 3b ?At baseline ?- monitor ? ? ?DVT prophylaxis: rivaroxaban ?Code Status: dnr ?Family Communication: no answer when daughter called ? ?Level of care: Med-Surg ?Status is: Inpatient ?Remains inpatient appropriate because: ongoing w/u ? ? ? ?Consultants:  ?psychiatry ? ?Procedures: ?none ? ?Antimicrobials:  ?none  ? ? ?  Subjective: ?Chronic back pain unchanged. No new pains. No fevers. No n/v/d.  ? ?Objective: ?Vitals:  ? 03/08/22 1614 03/08/22 2200 03/09/22 0656  ?BP: 136/87 (!) 150/88 (!) 142/79  ?Pulse: 91 81 78  ?Resp: '18 16 18  '$ ?Temp: 99 ?F (37.2 ?C) 98.7 ?F (37.1 ?C)   ?TempSrc:  Oral Oral   ?SpO2: 97% 100% 100%  ?Weight: 68.9 kg    ? ?No intake or output data in the 24 hours ending 03/09/22 0805 ?Filed Weights  ? 03/08/22 1614  ?Weight: 68.9 kg  ? ? ?Examination: ? ?General exam: Appears calm and comfortable  ?Respiratory system: Clear to auscultation. Respiratory effort normal. ?Cardiovascular system: S1 & S2 heard, RRR. No JVD, murmurs, rubs, gallops or clicks. No pedal edema. ?Gastrointestinal system: Abdomen is nondistended, soft and nontender. No organomegaly or masses felt. Normal bowel sounds heard. ?Central nervous system: Alert and oriented. No focal neurological deficits. ?Extremities: Symmetric 5 x 5 power. ?Skin: ulcer medial and lateral aspect of lower extremity around the malleoli ?Psychiatry: disheveled, pressured speech ? ? ? ?Data Reviewed: I have personally reviewed following labs and imaging studies ? ?CBC: ?Recent Labs  ?Lab 03/02/22 ?1014 03/08/22 ?1616  ?WBC 9.2 7.1  ?HGB 8.8* 8.8*  ?HCT 29.7* 29.4*  ?MCV 88.9 87.2  ?PLT 310 319  ? ?Basic Metabolic Panel: ?Recent Labs  ?Lab 03/02/22 ?1014 03/08/22 ?1616  ?NA 140 138  ?K 4.7 4.2  ?CL 104 104  ?CO2 28 25  ?GLUCOSE 142* 134*  ?BUN 24* 24*  ?CREATININE 1.51* 1.42*  ?CALCIUM 8.7* 8.6*  ? ?GFR: ?Estimated Creatinine Clearance: 32.4 mL/min (A) (by C-G formula based on SCr of 1.42 mg/dL (H)). ?Liver Function Tests: ?Recent Labs  ?Lab 03/02/22 ?1014 03/08/22 ?1616  ?AST 18 17  ?ALT 10 10  ?ALKPHOS 61 59  ?BILITOT 0.6 0.5  ?PROT 7.7 7.8  ?ALBUMIN 3.0* 3.0*  ? ?No results for input(s): LIPASE, AMYLASE in the last 168 hours. ?No results for input(s): AMMONIA in the last 168 hours. ?Coagulation Profile: ?Recent Labs  ?Lab 03/08/22 ?1616  ?INR 1.2  ? ?Cardiac Enzymes: ?No results for input(s): CKTOTAL, CKMB, CKMBINDEX, TROPONINI in the last 168 hours. ?BNP (last 3 results) ?No results for input(s): PROBNP in the last 8760 hours. ?HbA1C: ?No results for input(s): HGBA1C in the last 72 hours. ?CBG: ?No results for input(s): GLUCAP in  the last 168 hours. ?Lipid Profile: ?No results for input(s): CHOL, HDL, LDLCALC, TRIG, CHOLHDL, LDLDIRECT in the last 72 hours. ?Thyroid Function Tests: ?No results for input(s): TSH, T4TOTAL, FREET4, T3FREE, THYROIDAB in the last 72 hours. ?Anemia Panel: ?No results for input(s): VITAMINB12, FOLATE, FERRITIN, TIBC, IRON, RETICCTPCT in the last 72 hours. ?Urine analysis: ?   ?Component Value Date/Time  ? COLORURINE YELLOW (A) 03/08/2022 1920  ? APPEARANCEUR CLEAR (A) 03/08/2022 1920  ? LABSPEC 1.016 03/08/2022 1920  ? PHURINE 7.0 03/08/2022 1920  ? [place] NEGATIVE 03/08/2022 1920  ? [place] NEGATIVE 03/08/2022 1920  ? [place] NEGATIVE 03/08/2022 1920  ? [place] NEGATIVE 03/08/2022 1920  ? PROTEINUR 30 (A) 03/08/2022 1920  ? NITRITE NEGATIVE 03/08/2022 1920  ? LEUKOCYTESUR TRACE (A) 03/08/2022 1920  ? ?Sepsis Labs: ?'@LABRCNTIP'$ (procalcitonin:4,lacticidven:4) ? ?) ?Recent Results (from the past 240 hour(s))  ?Resp Panel by RT-PCR (Flu A&B, Covid) Nasopharyngeal Swab     Status: None  ? Collection Time: 03/02/22 11:24 AM  ? Specimen: Nasopharyngeal Swab; Nasopharyngeal(NP) swabs in vial transport medium  ?Result Value Ref Range Status  ? SARS Coronavirus 2 by RT  PCR NEGATIVE NEGATIVE Final  ?  Comment: (NOTE) ?SARS-CoV-2 target nucleic acids are NOT DETECTED. ? ?The SARS-CoV-2 RNA is generally detectable in upper respiratory ?specimens during the acute phase of infection. The lowest ?concentration of SARS-CoV-2 viral copies this assay can detect is ?138 copies/mL. A negative result does not preclude SARS-Cov-2 ?infection and should not be used as the sole basis for treatment or ?other patient management decisions. A negative result may occur with  ?improper specimen collection/handling, submission of specimen other ?than nasopharyngeal swab, presence of viral mutation(s) within the ?areas targeted by this assay, and inadequate number of viral ?copies(<138 copies/mL). A negative result must be combined  with ?clinical observations, patient history, and epidemiological ?information. The expected result is Negative. ? ?Fact Sheet for Patients:  ?EntrepreneurPulse.com.au ? ?Fact Sheet for Healthcare Prov

## 2022-03-09 NOTE — ED Notes (Signed)
Pt. Having difficulty keeping cardiac monitoring on, this RN encouraged pt. To keep pulse ox etc attached, pt. States she is unable to. This RN continues to monitor. ?

## 2022-03-09 NOTE — Consult Note (Signed)
Neurology Consultation ?Reason for Consult: AMS ?Referring Physician: Damita Dunnings, H ? ?CC: Altered mental status ? ?History is obtained from: Patient, daughter ? ?HPI: Laura Santiago is a 76 y.o. 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  She also has a history of stage IV lung cancer.  She has had multiple presentations recently due to not being able to manage at home.  Her daughter states that she will call her, stating that she is unable to move, unable to get around, and then by the time the daughter arrives she is up and around.  She has stated multiple times that she needs placement, then when approached about actually being placed denies that she said it.  She was brought into the emergency department on 3/23 and then was discharged with home health. ? ?Over the past couple of weeks, the patient has been complaining of significant pain, and has been taking narcotics.  She has narcotic patches as well as oxycodone 10 to 20 mg.  There is a note from 3/24 of her palliative nurse refilling the prescription for 90 oxycodone after having filled only a week prior.  Of note, the patient does have a history of substance use. ? ?Over the past couple of weeks, the daughter has noticed that she has been confused at times, thinking that she went to a wedding that she did not go to.  This seems to wax and wane. ? ? ? ? ?ROS: A 14 point ROS was performed and is negative except as noted in the HPI. .  ? ?Past Medical History:  ?Diagnosis Date  ? ADHD (attention deficit hyperactivity disorder)   ? in [place], no date on md note  ? Anemia   ? IDA 11/26/2019, Anemia in stage 4 chronic kidney disease ([place]) 09/03/2020  ? Arthritis   ? osteoarthritis right knee 09/30/2014  ? Breast cancer ([place]) 10/05/2013  ? in [place] +, [place] -, Her 2 is 3+  ? Cancer related pain 03/28/2021  ? in [name], md  notes spine mets  ? cervical compression fracture 03/18/2021  ? in [place]  ? DVT of lower extremity, bilateral ([place]) 03/30/2014  ? in [name]  ? Generalized muscle weakness 03/31/2016  ? in [name]  ? GERD (gastroesophageal reflux disease) 05/15/2013  ? per md in [name]  ? Hyperparathyroidism, secondary ([place]) 04/25/2018  ? in [name]  ? Hypertension 02/20/2016  ? info from MD in [name]  ? Memory loss 02/05/2015  ? in [name]  ? Metabolic acidosis 84/16/6063  ? in [employer] [place] [employer]  ? Metabolic syndrome 01/60/1093  ? in [name]  ? Osteopenia after menopause 03/29/2016  ? in [name]  ? 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 in [name]  ? Stasis ulcer of left lower extremity ([place]) 03/29/2016  ? in [name]  ? ? ? ?Family History  ?Problem Relation Age of Onset  ? Pancreatic cancer Mother   ? Stroke Father   ? Diabetes Father   ? Hypertension Father   ? Heart disease Father   ? Skin cancer Father   ? Varicose Veins Father   ? Skin cancer Brother   ? Cancer - Prostate Brother   ? ? ? ?Social History:  reports that she has never smoked. She has never used smokeless tobacco. She reports that she does not currently use alcohol. She reports current drug use. ? ? ?  Exam: ?Current vital signs: ?BP (!) 163/93   Pulse 90   Temp 98.9 ?F (37.2 ?C) (Oral)   Resp 20   Wt 68.9 kg   SpO2 100%   BMI 25.29 kg/m?  ?Vital signs in last 24 hours: ?Temp:  [98.7 ?F (37.1 ?C)-98.9 ?F (37.2 ?C)] 98.9 ?F (37.2 ?C) (03/30 1000) ?Pulse Rate:  [31-110] 90 (03/30 1315) ?Resp:  [16-20] 20 (03/30 1000) ?BP: (125-163)/(66-93) 163/93 (03/30 1300) ?SpO2:  [84 %-100 %] 100 % (03/30 1315) ? ? ?Physical Exam  ?Constitutional: Appears well-developed and well-nourished.  ?Psych: Affect appropriate to situation ?Eyes: No scleral injection ?HENT: No OP obstruction ?MSK: no joint deformities.  ?Cardiovascular: Normal rate and regular rhythm.  ?Respiratory: Effort normal, non-labored breathing ?GI: Soft.  No distension. There is no tenderness.  ?Skin: WDI ? ?Neuro: ?Mental Status: ?Patient is  awake, alert, oriented to person, place, month, year, and situation. ?Patient is able to give a clear and coherent history. ?No signs of aphasia or neglect ?World backwards is D-L-O-R-W, she is able to give the number of quarters in $2.75, but initially states 10 then corrects her self to 11 ?Cranial Nerves: ?II: Visual Fields are full. Pupils are equal, round, and reactive to light.   ?III,IV, VI: EOMI without ptosis or diploplia.  ?V: Facial sensation is symmetric to temperature ?VII: Facial movement is symmetric.  ?VIII: hearing is intact to voice ?X: Uvula elevates symmetrically ?XI: Shoulder shrug is symmetric. ?XII: tongue is midline without atrophy or fasciculations.  ?Motor: ?Tone is normal. Bulk is normal. 5/5 strength was present in all four extremities.  ?Sensory: ?Sensation is symmetric to light touch and temperature in the arms and legs. ?Cerebellar: ?FNF intact bilaterally ? ? ? ? ? ?I have reviewed labs in epic and the results pertinent to this consultation are: ?B12 354, previous from 12 months ago, 205 ? ?I have reviewed the images obtained: CT head - negative ? ?Impression: 76 year old female with waxing/waning confusion in the setting of significant narcotic use and pain.  Given that she does have significant reasons for pain, the risk/benefits of this will need to be weighed.  With a negative CT, I suspect that the risk of a missed CNS metastasis is relatively low, but I do think an MRI could be helpful.  She is currently refusing this, and I do not feel we can overall her wishes at this time.  My suspicion is that a significant portion of her waxing and waning mental status is related to her substance use, and I would limit narcotics as possible.  Low suspicion for seizure or other neurological cause. ? ?At 354, I doubt her B12 is contributing significantly, but I will check a methylmalonic acid.  I will give a single parenteral dose of B12, and this will need to be continued if her MMA is up  being positive. ? ?Recommendations: ?1) MRI brain with and without contrast if she agrees ?2) limit narcotic use as possible. ?3) methylmalonic acid, continue B12 injections as outpatient if this is positive. ?4) neurology will be available on an as-needed basis moving forward. ? ? ?Roland Rack, MD ?[facility] ?[phone] ? ?If 7pm- 7am, please page neurology on call as listed in [place]. ? ?

## 2022-03-09 NOTE — Progress Notes (Signed)
Admission profile updated. ?

## 2022-03-09 NOTE — BH Assessment (Signed)
Pt  placed  under  IVC papers  informed Estill Bamberg  RN ?

## 2022-03-09 NOTE — ED Notes (Signed)
Called MRI to let them know that pt. Has agreed to do ordered MRI. MRI states they will attempt to fit her in. ?

## 2022-03-09 NOTE — Progress Notes (Addendum)
Pt refused vitamin B12 even with education. NP Randol Kern made aware but no new order. Will continue to monitor. ? ?Update 2331: Page M Kirkpatrict (nuerology). Will notify incoming shift. Will continue to monitor. ? ?Update 0205: Pt is requesting something for sleep. NP Randol Kern made aware. Will continue to monitor. ? ?Update 0409: NP Randol Kern ordered benadryl 12.5 mg IV once. Will continue to monitor. ? ?Update 0654: Pt made aware provider ordered something for itching. Pt states to take it at a later time. Will continue to monitor. ?

## 2022-03-09 NOTE — Assessment & Plan Note (Addendum)
Continue adderall pending verification ?Behavioral health consult. Patient with somewhat pressured speech ?

## 2022-03-10 DIAGNOSIS — F330 Major depressive disorder, recurrent, mild: Secondary | ICD-10-CM | POA: Insufficient documentation

## 2022-03-10 DIAGNOSIS — T451X5D Adverse effect of antineoplastic and immunosuppressive drugs, subsequent encounter: Secondary | ICD-10-CM | POA: Insufficient documentation

## 2022-03-10 DIAGNOSIS — F902 Attention-deficit hyperactivity disorder, combined type: Secondary | ICD-10-CM

## 2022-03-10 DIAGNOSIS — G620 Drug-induced polyneuropathy: Secondary | ICD-10-CM | POA: Insufficient documentation

## 2022-03-10 LAB — BASIC METABOLIC PANEL
Anion gap: 5 (ref 5–15)
BUN: 17 mg/dL (ref 8–23)
CO2: 27 mmol/L (ref 22–32)
Calcium: 8.1 mg/dL — ABNORMAL LOW (ref 8.9–10.3)
Chloride: 104 mmol/L (ref 98–111)
Creatinine, Ser: 1.23 mg/dL — ABNORMAL HIGH (ref 0.44–1.00)
GFR, Estimated: 45 mL/min — ABNORMAL LOW (ref >60)
Glucose, Bld: 118 mg/dL — ABNORMAL HIGH (ref 70–99)
Potassium: 4.1 mmol/L (ref 3.5–5.1)
Sodium: 136 mmol/L (ref 135–145)

## 2022-03-10 LAB — CBC
HCT: 27.1 % — ABNORMAL LOW (ref 36.0–46.0)
Hemoglobin: 8.1 g/dL — ABNORMAL LOW (ref 12.0–15.0)
MCH: 26 pg (ref 26.0–34.0)
MCHC: 29.9 g/dL — ABNORMAL LOW (ref 30.0–36.0)
MCV: 86.9 fL (ref 80.0–100.0)
Platelets: 286 10*3/uL (ref 150–400)
RBC: 3.12 MIL/uL — ABNORMAL LOW (ref 3.87–5.11)
RDW: 16.3 % — ABNORMAL HIGH (ref 11.5–15.5)
WBC: 4.9 10*3/uL (ref 4.0–10.5)
nRBC: 0 % (ref 0.0–0.2)

## 2022-03-10 MED ORDER — DIPHENHYDRAMINE HCL 50 MG/ML IJ SOLN: 12.5000 mg | Freq: Once | INTRAMUSCULAR | Status: DC

## 2022-03-10 NOTE — Plan of Care (Signed)
?  Problem: Clinical Measurements: Goal: Respiratory complications will improve Outcome: Progressing   Problem: Activity: Goal: Risk for activity intolerance will decrease Outcome: Progressing   Problem: Pain Managment: Goal: General experience of comfort will improve Outcome: Progressing   Problem: Safety: Goal: Ability to remain free from injury will improve Outcome: Progressing   

## 2022-03-10 NOTE — Discharge Summary (Signed)
Laura Santiago WSF:[id number] DOB: 01/30/1946 DOA: 03/08/2022 ? ?PCP: [facility] ? ?Admit date: 03/08/2022 ?Discharge date: 03/10/2022 ? ?Time spent: 40 minutes ? ?Recommendations for Outpatient Follow-up:  ?PCP and oncology f/u  ? ? ? ?Discharge Diagnoses:  ?Principal Problem: ?  ADHD (attention deficit hyperactivity disorder) ?Active Problems: ?  Altered mental status, unspecified ?  Major depressive disorder, recurrent episode, mild ([place]) ?  Metastatic breast cancer ([place]) ?  Anemia in stage 3b chronic kidney disease ([place]) ?  Essential hypertension ?  Memory loss ?  Physical deconditioning ?  Stage 3b chronic kidney disease ([place]) ?  Peripheral neuropathy due to chemotherapy [facility]) ?  Personal history of DVT (deep vein thrombosis) ?  Altered mental status ?  Palliative care encounter ? ? ?Discharge Condition: stable ? ?Diet recommendation: regular ? ?Filed Weights  ? 03/08/22 1614  ?Weight: 68.9 kg  ? ? ?History of present illness:  ?From admission h and p ?Laura Santiago is a 76 y.o. 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  Patient was evaluated in the ED on 3/23 with a complaint of weakness, with difficulty getting around the facility and inadequate ability to care for self.  Work-up in the ED at that time was unremarkable and she was discharged back to the facility however over the course of the past few days daughter reports increasing concern for her behavior. ?ED course: Temperature 99 with otherwise normal vitals.  WBC 7100, hemoglobin at  baseline at 8.8.  CMP with BUN/creatinine of 24/1.42, essentially unchanged from recent visit.  Urinalysis unremarkable.  CT head showed findings consistent with microvascular disease changes but no acute intracranial abnormality ?Hospitalist consulted for admission  ?  ? ?Hospital Course:  ?Patient admitted for altered mental status. Her mental status changes appear to be chronically worsening of her mental health. W/u here included CT of the head which was negative, no electrolyte or other lab abnormalities, negative urine drug screen, normal tsh and b12. I advised MRI of brain (negative 3 months ago but patient has known metastatic disease) but patient declined; her neuro exam was reassuringly non-focal. There was concern about competence and patient was involuntarily committed 3/30-3/31. Psychiatry evaluated patient and deemed the patient to have competence. Did not advise inpatient psychiatric admission nor changes to home meds. The underlying concern here is that it appears patient's phsical and mental health are declining to the point that she needs more assistance. Temporary plan will be home health aid at her independent living facility. More long term plan is that daughter will be exploring assisted living options.  ? ?Procedures: ?none  ? ?Consultations: ?Neurology, psychiatry ? ?Discharge Exam: ?Vitals:  ? 03/10/22 0630 03/10/22 0924  ?BP: 135/75 116/72  ?Pulse: 75 70  ?Resp: 18 18  ?Temp: 98.6 ?F (37 ?C) 98 ?F (36.7 ?C)  ?SpO2: 98% 100%  ? ? ?General exam: Appears calm and comfortable  ?Respiratory system: Clear to auscultation. Respiratory effort normal. ?Cardiovascular system: S1 & S2 heard, RRR. No JVD, murmurs, rubs, gallops or clicks. No pedal edema. ?Gastrointestinal system: Abdomen is nondistended, soft and nontender. No  organomegaly or masses felt. Normal bowel sounds heard. ?Central nervous system: Alert and oriented. No focal neurological deficits. ?Extremities: Symmetric 5 x 5 power. ?Skin:  ulcer medial and lateral aspect of lower extremity around the malleoli ?Psychiatry: disheveled, pressured speech ? ?Discharge Instructions ? ? ?Discharge Instructions   ? ? Diet - low sodium heart healthy   Complete by: As directed ?  ? Increase activity slowly   Complete by: As directed ?  ? ?  ? ?Allergies as of 03/10/2022   ? ?   Reactions  ? Corticosteroids Other (See Comments)  ? Pt trf from [name] and per primary md for her cancer tx. Notes that it causes agitation intolerance  ? Sulfa Antibiotics Other (See Comments)  ? Pt moved from [name] and in MD notes she has allergy but we do not know reactions when taking the drug  ? Celebrex [celecoxib] Rash  ? ?  ? ?  ?Medication List  ?  ? ?TAKE these medications   ? ?acetaminophen 500 MG tablet ?Commonly known as: TYLENOL ?Take 500 mg by mouth every 4 (four) hours as needed. ?  ?amphetamine-dextroamphetamine 30 MG 24 hr capsule ?Commonly known as: Adderall XR ?Take 1 capsule (30 mg total) by mouth daily. ?  ?calcium citrate 950 (200 Ca) MG tablet ?Commonly known as: CALCITRATE - dosed in mg elemental calcium ?Take 200 mg of elemental calcium by mouth daily. ?  ?DULoxetine 30 MG capsule ?Commonly known as: CYMBALTA ?Take 30 mg by mouth daily. ?  ?fentaNYL 25 MCG/HR ?Commonly known as: [place] ?Place 1 patch onto the skin every 3 (three) days. ?  ?gabapentin 100 MG capsule ?Commonly known as: NEURONTIN ?Take 100 mg by mouth 2 (two) times daily. ?  ?gentamicin cream 0.1 % ?Commonly known as: GARAMYCIN ?SMARTSIG:1 Application Topical ?  ?hydrOXYzine 25 MG capsule ?Commonly known as: VISTARIL ?Take 50 mg by mouth every 8 (eight) hours as needed. ?  ?lisinopril 20 MG tablet ?Commonly known as: ZESTRIL ?Take 20 mg by mouth daily. ?  ?Multi-Vitamin tablet ?Take 1 tablet by mouth daily. ?  ?naloxone 4 MG/0.1ML Liqd nasal spray kit ?Commonly known as: NARCAN ?SPRAY 1 SPRAY INTO ONE NOSTRIL AS DIRECTED FOR OPIOID OVERDOSE (TURN PERSON ON SIDE AFTER DOSE. IF NO RESPONSE IN 2-3  MINUTES OR PERSON RESPONDS BUT RELAPSES, REPEAT USING A NEW SPRAY DEVICE AND SPRAY INTO THE OTHER NOSTRIL. CALL 911 AFTER USE.) * EMERGENCY USE ONLY * ?  ?Oxycodone HCl 10 MG Tabs ?TAKE 1 TABLET BY MOUTH EVERY 4 HOURS AS NEEDED ?  ?rivaroxaban 20 MG Tabs tablet ?Commonly known as: XARELTO ?Take 1 tablet (20 mg total) by mouth daily with supper. ?  ?sodium hypochlorite 0.125 % Soln ?Commonly known as: DAKIN'S 1/4 STRENGTH ?Apply 1 application topically as directed. Moisten gauze with solution and wrap wound ?  ? ?  ? ?Allergies  ?Allergen Reactions  ? Corticosteroids Other (See Comments)  ?  Pt trf from [name] and per primary md for her cancer tx. Notes that it causes agitation intolerance  ? Sulfa Antibiotics Other (See Comments)  ?  Pt moved from [name] and in MD notes she has allergy but we do not know reactions when taking the drug  ? Celebrex [Celecoxib] Rash  ? ? Follow-up Information   ? ? [place] Follow up.   ?Contact information: ?[place] [address] ?[phone] ? ? ?  ?  ? ? [name], MD Follow up.   ?Specialty:  Oncology ?Contact information: ?[place] [address] ?[phone] ? ? ?  ?  ? ?  ?  ? ?  ? ? ? ?The results of significant diagnostics from this hospitalization (including imaging, microbiology, ancillary and laboratory) are listed below for reference.   ? ?Significant Diagnostic Studies: ?DG Thoracic Spine 2 View ? ?Result Date: 02/24/2022 ?CLINICAL DATA:  Mid back pain. EXAM: THORACIC SPINE 2 VIEWS COMPARISON:  None. FINDINGS: There is no evidence of an acute thoracic spine fracture. Exaggerated kyphosis of the thoracic spine is seen with mild levoscoliosis of the visualized portion of the lumbar spine. There is marked severity multilevel endplate sclerosis with associated moderate severity lateral osteophyte formation. Moderate to marked severity multilevel intervertebral disc space narrowing is seen. No other significant bone  abnormalities are identified. A left-sided venous Port-A-Cath is in place. IMPRESSION: 1. Advanced multilevel degenerative changes throughout the thoracic spine with no evidence of an acute thoracic spine fracture. 2

## 2022-03-10 NOTE — Care Management Important Message (Signed)
Important Message ? ?Patient Details  ?Name: Laura Santiago ?MRN: [id number] ?Date of Birth: 06-24-1946 ? ? ?Medicare Important Message Given:  Yes ? ? ? ? ?[name] ?03/10/2022, 3:26 PM ?

## 2022-03-10 NOTE — TOC Progression Note (Addendum)
Transition of Care (TOC) - Progression Note  ? ? ?Patient Details  ?Name: Laura Santiago ?MRN: [id number] ?Date of Birth: [birth year] ? ?Transition of Care (TOC) CM/SW Contact  ?Florice Hindle E Chael Urenda, LCSW ?Phone Number: ?03/10/2022, 10:04 AM ? ?Clinical Narrative:     Samuel Jester at [facility], explained patient is medically ready for DC. Asked if plan is for patient to return to [place] and work on ALF from there. Rollene Fare said she is going to call [facility] and then call CSW back. She is aware patient is medically cleared for DC today. ? ?11:00- Asked Rollene Fare for update, she is still waiting to see if patient can come back there today.  ? ?1:50- Asked Rollene Fare for update again. ? ?2:20- Asked Rollene Fare for update again. ? ? ?Expected Discharge Plan: Assisted Living (TBD) ?Barriers to Discharge: Continued Medical Work up ? ?Expected Discharge Plan and Services ?Expected Discharge Plan: Assisted Living (TBD) ?  ?Discharge Planning Services: CM Consult ?  ?Living arrangements for the past 2 months: [place] ?                ?DME Arranged: N/A ?DME Agency: NA ?  ?  ?  ?HH Arranged: RN, PT, OT, Nurse's Aide, Social Work ?[place] Agency: [place] ?Date HH Agency Contacted: 03/09/22 ?Time [place]: [phone] ?Representative spoke with at [place]: Sharmon [place]vere ? ? ?Social Determinants of Health (SDOH) Interventions ?  ? ?Readmission Risk Interventions ?   ? View : No data to display.  ?  ?  ?  ? ? ?

## 2022-03-10 NOTE — TOC Transition Note (Addendum)
Transition of Care (TOC) - CM/SW Discharge Note ? ? ?Patient Details  ?Name: Laura Santiago ?MRN: [id number] ?Date of Birth: 12-Sep-1946 ? ?Transition of Care (TOC) CM/SW Contact:  ?Turkessa Ostrom E Pranav Lince, LCSW ?Phone Number: ?03/10/2022, 3:02 PM ? ? ?Clinical Narrative:    ?Spoke to [place] at [facility]. Rollene Fare confirmed patient can come back today. She said they were trying to reach patient's daughter to confirm private pay [facility] had been set up and have not heard back. Rollene Fare confirmed they do not need to wait for this for patient to return to [facility] today. ? ?Called daughter Laura Santiago who says she will call Rollene Fare now. Laura Santiago confirmed IHA is in place. Laura Santiago stated she will transport patient back to [facility] today.  ? ?Called Malachy Mood with [facility] who confirmed they will continue to follow for [facility] services. ? ?Faxed DC Summary, FL2 with DC meds, and HH orders to [place] at [place] at [phone]. ? ? ?Final next level of care: [place] ?Barriers to Discharge: Barriers Resolved ? ? ?Patient Goals and CMS Choice ?Patient states their goals for this hospitalization and ongoing recovery are:: back to [facility] with [facility] and private pay [place] ?CMS Medicare.gov Compare Post Acute Care list provided to:: Patient Represenative (must comment) ?Choice offered to / list presented to : Adult Children ? ?Discharge Placement ?  ?           ?  ?Patient to be transferred to facility by: Laura Santiago - daughter ?Name of family member notified: Laura Santiago - daughter ?Patient and family notified of of transfer: 03/10/22 ? ?Discharge Plan and Services ?  ?Discharge Planning Services: CM Consult ?           ?DME Arranged: N/A ?DME Agency: NA ?  ?  ?  ?HH Arranged: RN, PT, OT, Nurse's Aide ?[place] Agency: [place] ?Date HH Agency Contacted: 03/10/22 ?Time [place]: 8850 ?Representative spoke with at [place]: Malachy Mood ? ?Social Determinants of Health (SDOH) Interventions ?  ? ? ?Readmission Risk  Interventions ?   ? View : No data to display.  ?  ?  ?  ? ? ? ? ? ?

## 2022-03-10 NOTE — Progress Notes (Signed)
[place] ?Dose of benadryl ordered for itching ? ?

## 2022-03-10 NOTE — NC FL2 (Signed)
?[place] MEDICAID FL2 LEVEL OF CARE SCREENING TOOL  ?  ? ?IDENTIFICATION  ?Patient Name: ?Laura Santiago Birthdate: 31-May-1946 Sex: female Admission Date (Current Location): ?03/08/2022  ?[place] and [place] Number: ? [place] ?  Facility and Address:  ?[facility], [address] [place], [place] [address] ?     Provider Number: ?[id number]  ?Attending Physician Name and Address:  ?Gwynne Edinger, MD ? Relative Name and Phone Number:  ?[name],[name] (Daughter)   [phone] (Mobile) ?   ?Current Level of Care: ?Hospital Recommended Level of Care: ?Other (Comment) (ILF) Prior Approval Number: ?  ? ?Date Approved/Denied: ?  PASRR Number: ?  ? ?Discharge Plan: ?  ?  ? ?Current Diagnoses: ?Patient Active Problem List  ? Diagnosis Date Noted  ? ADHD (attention deficit hyperactivity disorder) 03/09/2022  ? Major depressive disorder, recurrent episode, mild ([place]) 03/09/2022  ? Palliative care encounter   ? Stage 3b chronic kidney disease ([place]) 03/08/2022  ? Venous stasis ulcer of left lower leg with edema of left lower leg ([place]) 03/08/2022  ? Altered mental status, unspecified 03/08/2022  ? Altered mental status 03/08/2022  ? GAD (generalized anxiety disorder) 02/22/2022  ? Peripheral neuropathy due to chemotherapy ([place]) 02/22/2022  ? Personal history of DVT (deep vein thrombosis) 02/22/2022  ? SIRS (systemic inflammatory response syndrome) ([place]) 12/02/2021  ? Venous stasis ulcer with fat layer exposed ([place]) 08/29/2021  ? Tinea pedis of both feet 08/29/2021  ? Cellulitis of right lower extremity 08/29/2021  ? Metastatic breast cancer ([place]) 05/26/2021  ? Back pain 05/17/2021  ? Spine metastasis ([place]) 05/17/2021  ? Malignant neoplasm metastatic to bone ([place]) 04/27/2021  ? Primary insomnia 03/28/2021  ? Unsteady gait 03/28/2021  ? Lumbar compression fracture, open, initial encounter ([place]) 03/18/2021  ? Posterior vitreous detachment of both eyes 02/08/2021  ? Anemia in stage 3b chronic kidney  disease ([place]) 09/03/2020  ? AKI (acute kidney injury) ([place]) 06/26/2020  ? Bacteriuria 11/26/2019  ? Iron deficiency anemia 11/26/2019  ? Venous stasis ulcer of left calf limited to breakdown of skin with varicose veins ([place]) 10/22/2018  ? Myopia of both eyes with astigmatism and presbyopia 05/27/2018  ? 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 Hyperparathyroidism, secondary ([place]) 04/25/2018  ? Lactic acidosis 01/27/2018  ? Generalized muscle weakness 03/31/2016  ? Attention deficit hyperactivity disorder (ADHD), predominantly inattentive type 03/29/2016  ? Osteopenia 03/29/2016  ? Essential hypertension 02/20/2016  ? Metabolic syndrome 27/05/2375  ? Chronic anticoagulation 11/16/2015  ? Right leg swelling 11/16/2015  ? Memory loss 02/05/2015  ? Atrophy of muscle of multiple sites 09/30/2014  ? Physical deconditioning 09/30/2014  ? Primary osteoarthritis of right knee 09/30/2014  ? History of breast cancer in female 09/14/2014  ? History of radiation therapy 09/14/2014  ? Acute embolism and thrombosis of unspecified deep veins of lower extremity, bilateral ([place]) 03/30/2014  ? Lymphadenopathy, inguinal 08/28/2013  ? Venous ulcer of ankle, left ([place]) 08/04/2013  ? GERD (gastroesophageal reflux disease) 06/02/2013  ? Personal history of other venous thrombosis and embolism 12/12/1999  ? ? ?Orientation RESPIRATION BLADDER Height & Weight   ?  ?Self, Time, Situation, Place ? Normal Continent Weight: 152 lb (68.9 kg) ?Height:     ?BEHAVIORAL SYMPTOMS/MOOD NEUROLOGICAL BOWEL NUTRITION STATUS  ?      Diet (regular)  ?AMBULATORY STATUS COMMUNICATION OF NEEDS Skin   ?Supervision   Skin abrasions ?  ?  ?  ?    ?     ?     ? ? ?  Personal Care Assistance Level of Assistance  ?Bathing, Feeding, Dressing Bathing Assistance: Limited assistance ?Feeding assistance: Independent ?Dressing Assistance: Limited assistance ?   ? ?Functional Limitations Info  ?    ?   ?   ? ? ?SPECIAL CARE FACTORS FREQUENCY  ?PT (By licensed PT), OT (By licensed OT)   ?  ?PT Frequency: home health ?OT Frequency: home health ?  ?  ?  ?   ? ? ?Contractures    ? ? ?Additional Factors Info  ?Code Status, Allergies Code Status Info: DNR ?Allergies Info: corticosteroids, sulfa antibiotics, celebrex ?  ?  ?  ?   ? ?Current Medications (03/10/2022):   ?TAKE these medications   ?  ?acetaminophen 500 MG tablet ?Commonly known as: TYLENOL ?Take 500 mg by mouth every 4 (four) hours as needed. ?   ?amphetamine-dextroamphetamine 30 MG 24 hr capsule ?Commonly known as: Adderall XR ?Take 1 capsule (30 mg total) by mouth daily. ?   ?calcium citrate 950 (200 Ca) MG tablet ?Commonly known as: CALCITRATE - dosed in mg elemental calcium ?Take 200 mg of elemental calcium by mouth daily. ?   ?DULoxetine 30 MG capsule ?Commonly known as: CYMBALTA ?Take 30 mg by mouth daily. ?   ?fentaNYL 25 MCG/HR ?Commonly known as: [place] ?Place 1 patch onto the skin every 3 (three) days. ?   ?gabapentin 100 MG capsule ?Commonly known as: NEURONTIN ?Take 100 mg by mouth 2 (two) times daily. ?   ?gentamicin cream 0.1 % ?Commonly known as: GARAMYCIN ?SMARTSIG:1 Application Topical ?   ?hydrOXYzine 25 MG capsule ?Commonly known as: VISTARIL ?Take 50 mg by mouth every 8 (eight) hours as needed. ?   ?lisinopril 20 MG tablet ?Commonly known as: ZESTRIL ?Take 20 mg by mouth daily. ?   ?Multi-Vitamin tablet ?Take 1 tablet by mouth daily. ?   ?naloxone 4 MG/0.1ML Liqd nasal spray kit ?Commonly known as: NARCAN ?SPRAY 1 SPRAY INTO ONE NOSTRIL AS DIRECTED FOR OPIOID OVERDOSE (TURN PERSON ON SIDE AFTER DOSE. IF NO RESPONSE IN 2-3 MINUTES OR PERSON RESPONDS BUT RELAPSES, REPEAT USING A NEW SPRAY DEVICE AND SPRAY INTO THE OTHER NOSTRIL. CALL 911 AFTER USE.) * EMERGENCY USE ONLY * ?   ?Oxycodone HCl 10 MG Tabs ?TAKE 1 TABLET BY MOUTH EVERY 4 HOURS AS NEEDED ?   ?rivaroxaban 20 MG Tabs tablet ?Commonly known as: XARELTO ?Take 1 tablet (20 mg  total) by mouth daily with supper. ?   ?sodium hypochlorite 0.125 % Soln ?Commonly known as: DAKIN'S 1/4 STRENGTH ?Apply 1 application topically as directed. Moisten gauze with solution and wrap wound ?   ? ? ?Relevant Imaging Results: ? ?Relevant Lab Results: ? ? ?Additional Information ?SS #: [id number] ? ?Livianna Petraglia E Monita Swier, LCSW ? ? ? ? ?

## 2022-03-11 LAB — URINE CULTURE: Culture: 20000 — AB

## 2022-03-11 NOTE — Progress Notes (Signed)
03/11/22 [place] at [facility]. She stated patient was going to be transported by EMS, but on arrival of EMS, patient had a panic attack and refused transport. Explained it was important for patient to return for attention to port. Suggested to Chanel to contact [employer] and we could provide a cab voucher for the patient to come here and return to [facility]. Patients concern was she would be readmitted and compelled to stay here as had happened on her last visit. Requested Chanel call back to follow up. Explained I would be glad to speak with patient if she had doubts about returning to hospital ?

## 2022-03-11 NOTE — Progress Notes (Signed)
03/11/22 1545: Contacted Josh Borders AP NP about port remaining accessed. He stated port should be deaccessed and could not wait until visit on 03/14/22. ?

## 2022-03-11 NOTE — Significant Event (Signed)
Contacted on 03/11/22 by Chanel at [facility]. She initially stated patient wanted portacath removed, then further described there was IV tubing hanging from the port and the patient wanted those removed. Asked if patient was being seen by home health. Chanel stated it was only for wound care. I explained she would need to return to the hospital ED to have it evaluated. Chanel replied, we don't have patient transport on the weekend. Reiterated she would need to return to have port deaccessed.  ? ?Read she was a patient of [facility]. At 1520 called on call number listed on web page and asked if they would return my call. ?

## 2022-03-11 NOTE — Progress Notes (Signed)
03/11/22 1750: Contacted by Malachy Mood at [employer]. She stated there was a home health RN going to see the patient on Monday and they would ask her to de access the port. Related if they were unable to see her, ask someone to contact the [place] on Monday. [place] [phone]. Left voicemail. ?

## 2022-03-11 NOTE — Progress Notes (Addendum)
4/1 - 4:30 pm ?Notified by [facility] that patient was DC yesterday but her port was not deaccessed. AC attempted to contact [facility] agency to see if they can go deaccess port but has not heard back.  ?Brion Aliment with [facility], left VM and asked her to follow up with [facility].  ?Per [facility], they tried to get EMS to bring patient back to the hospital but patient refused. Per [facility], Josh with [facility] is aware and can also follow up Monday if needed.  ? Oleh Genin, LCSW ?[phone] ? ?

## 2022-03-12 ENCOUNTER — Encounter: Payer: Self-pay | Admitting: Oncology

## 2022-03-13 ENCOUNTER — Telehealth: Payer: Self-pay | Admitting: *Deleted

## 2022-03-13 ENCOUNTER — Encounter: Payer: Self-pay | Admitting: Oncology

## 2022-03-13 LAB — METHYLMALONIC ACID, SERUM: Methylmalonic Acid, Quantitative: 484 nmol/L — ABNORMAL HIGH (ref 0–378)

## 2022-03-13 NOTE — Telephone Encounter (Signed)
Message from [facility] asking for an order for port to be removed. ?

## 2022-03-13 NOTE — Telephone Encounter (Signed)
Josh- Can you touch base with patient / daughter to see why port is being asked to be removed? I see her tomorrow

## 2022-03-14 ENCOUNTER — Other Ambulatory Visit: Payer: Self-pay

## 2022-03-14 ENCOUNTER — Inpatient Hospital Stay (HOSPITAL_BASED_OUTPATIENT_CLINIC_OR_DEPARTMENT_OTHER): Payer: Medicare Other | Admitting: Hospice and Palliative Medicine

## 2022-03-14 ENCOUNTER — Inpatient Hospital Stay (HOSPITAL_BASED_OUTPATIENT_CLINIC_OR_DEPARTMENT_OTHER): Payer: Medicare Other | Admitting: Oncology

## 2022-03-14 ENCOUNTER — Inpatient Hospital Stay: Payer: Medicare Other

## 2022-03-14 ENCOUNTER — Inpatient Hospital Stay: Payer: Medicare Other | Attending: Oncology

## 2022-03-14 ENCOUNTER — Encounter: Payer: Self-pay | Admitting: Oncology

## 2022-03-14 VITALS — Temp 99.5°F

## 2022-03-14 VITALS — BP 152/78 | HR 85 | Resp 18 | Wt 151.4 lb

## 2022-03-14 DIAGNOSIS — Z5111 Encounter for antineoplastic chemotherapy: Secondary | ICD-10-CM | POA: Insufficient documentation

## 2022-03-14 DIAGNOSIS — C778 Secondary and unspecified malignant neoplasm of lymph nodes of multiple regions: Secondary | ICD-10-CM | POA: Insufficient documentation

## 2022-03-14 DIAGNOSIS — C7951 Secondary malignant neoplasm of bone: Secondary | ICD-10-CM | POA: Insufficient documentation

## 2022-03-14 DIAGNOSIS — C787 Secondary malignant neoplasm of liver and intrahepatic bile duct: Secondary | ICD-10-CM | POA: Insufficient documentation

## 2022-03-14 DIAGNOSIS — C50919 Malignant neoplasm of unspecified site of unspecified female breast: Secondary | ICD-10-CM

## 2022-03-14 DIAGNOSIS — Z5112 Encounter for antineoplastic immunotherapy: Secondary | ICD-10-CM | POA: Diagnosis not present

## 2022-03-14 DIAGNOSIS — C50911 Malignant neoplasm of unspecified site of right female breast: Secondary | ICD-10-CM | POA: Diagnosis not present

## 2022-03-14 DIAGNOSIS — Z170 Estrogen receptor positive status [ER+]: Secondary | ICD-10-CM | POA: Diagnosis not present

## 2022-03-14 LAB — CBC WITH DIFFERENTIAL/PLATELET
Abs Immature Granulocytes: 0.05 10*3/uL (ref 0.00–0.07)
Basophils Absolute: 0 10*3/uL (ref 0.0–0.1)
Basophils Relative: 0 %
Eosinophils Absolute: 0.2 10*3/uL (ref 0.0–0.5)
Eosinophils Relative: 2 %
HCT: 29.7 % — ABNORMAL LOW (ref 36.0–46.0)
Hemoglobin: 8.9 g/dL — ABNORMAL LOW (ref 12.0–15.0)
Immature Granulocytes: 1 %
Lymphocytes Relative: 21 %
Lymphs Abs: 1.5 10*3/uL (ref 0.7–4.0)
MCH: 26.3 pg (ref 26.0–34.0)
MCHC: 30 g/dL (ref 30.0–36.0)
MCV: 87.9 fL (ref 80.0–100.0)
Monocytes Absolute: 0.5 10*3/uL (ref 0.1–1.0)
Monocytes Relative: 7 %
Neutro Abs: 4.8 10*3/uL (ref 1.7–7.7)
Neutrophils Relative %: 69 %
Platelets: 280 10*3/uL (ref 150–400)
RBC: 3.38 MIL/uL — ABNORMAL LOW (ref 3.87–5.11)
RDW: 16.5 % — ABNORMAL HIGH (ref 11.5–15.5)
WBC: 7 10*3/uL (ref 4.0–10.5)
nRBC: 0 % (ref 0.0–0.2)

## 2022-03-14 LAB — COMPREHENSIVE METABOLIC PANEL
ALT: 10 U/L (ref 0–44)
AST: 20 U/L (ref 15–41)
Albumin: 3.3 g/dL — ABNORMAL LOW (ref 3.5–5.0)
Alkaline Phosphatase: 61 U/L (ref 38–126)
Anion gap: 9 (ref 5–15)
BUN: 33 mg/dL — ABNORMAL HIGH (ref 8–23)
CO2: 25 mmol/L (ref 22–32)
Calcium: 8.7 mg/dL — ABNORMAL LOW (ref 8.9–10.3)
Chloride: 103 mmol/L (ref 98–111)
Creatinine, Ser: 1.9 mg/dL — ABNORMAL HIGH (ref 0.44–1.00)
GFR, Estimated: 27 mL/min — ABNORMAL LOW (ref >60)
Glucose, Bld: 108 mg/dL — ABNORMAL HIGH (ref 70–99)
Potassium: 4.3 mmol/L (ref 3.5–5.1)
Sodium: 137 mmol/L (ref 135–145)
Total Bilirubin: 0.3 mg/dL (ref 0.3–1.2)
Total Protein: 7.6 g/dL (ref 6.5–8.1)

## 2022-03-14 LAB — CULTURE, BLOOD (ROUTINE X 2)
Culture: NO GROWTH
Culture: NO GROWTH
Special Requests: ADEQUATE
Special Requests: ADEQUATE

## 2022-03-14 MED ORDER — RIVAROXABAN 20 MG PO TABS: 20.0000 mg | ORAL_TABLET | Freq: Every day | ORAL | 3 refills | Status: DC

## 2022-03-14 MED ORDER — TRASTUZUMAB-DKST CHEMO 150 MG IV SOLR
6.0000 mg/kg | Freq: Once | INTRAVENOUS | Status: AC
  Administered 2022-03-14: 420 mg via INTRAVENOUS
  Filled 2022-03-14: qty 20

## 2022-03-14 MED ORDER — SODIUM CHLORIDE 0.9 % IV SOLN
Freq: Once | INTRAVENOUS | Status: AC
  Filled 2022-03-14: qty 250

## 2022-03-14 MED ORDER — SODIUM CHLORIDE 0.9 % IV SOLN
420.0000 mg | Freq: Once | INTRAVENOUS | Status: AC
  Administered 2022-03-14: 420 mg via INTRAVENOUS
  Filled 2022-03-14: qty 14

## 2022-03-14 MED ORDER — LISINOPRIL 20 MG PO TABS: 20.0000 mg | ORAL_TABLET | Freq: Every day | ORAL | 3 refills | Status: DC

## 2022-03-14 MED ORDER — ACETAMINOPHEN 325 MG PO TABS
650.0000 mg | ORAL_TABLET | Freq: Once | ORAL | Status: AC
  Administered 2022-03-14: 650 mg via ORAL
  Filled 2022-03-14: qty 2

## 2022-03-14 MED ORDER — DIPHENHYDRAMINE HCL 25 MG PO CAPS
50.0000 mg | ORAL_CAPSULE | Freq: Once | ORAL | Status: AC
  Administered 2022-03-14: 50 mg via ORAL
  Filled 2022-03-14: qty 2

## 2022-03-14 MED ORDER — HEPARIN SOD (PORK) LOCK FLUSH 100 UNIT/ML IV SOLN
500.0000 [IU] | Freq: Once | INTRAVENOUS | Status: AC | PRN
  Administered 2022-03-14: 500 [IU]
  Filled 2022-03-14: qty 5

## 2022-03-14 NOTE — Patient Instructions (Signed)
[facility] CANCER CTR AT [place]  Discharge Instructions: ?Thank you for choosing [facility] to provide your oncology and hematology care.  ?If you have a lab appointment with the [facility], please go directly to the [facility] and check in at the registration area. ? ?Wear comfortable clothing and clothing appropriate for easy access to any Portacath or PICC line.  ? ?We strive to give you quality time with your provider. You may need to reschedule your appointment if you arrive late (15 or more minutes).  Arriving late affects you and other patients whose appointments are after yours.  Also, if you miss three or more appointments without notifying the office, you may be dismissed from the clinic at the provider?s discretion.    ?  ?For prescription refill requests, have your pharmacy contact our office and allow 72 hours for refills to be completed.   ? ?Today you received the following chemotherapy and/or immunotherapy agents: herceptin, perjeta    ?  ?To help prevent nausea and vomiting after your treatment, we encourage you to take your nausea medication as directed. ? ?BELOW ARE SYMPTOMS THAT SHOULD BE REPORTED IMMEDIATELY: ?*FEVER GREATER THAN 100.4 F (38 ?C) OR HIGHER ?*CHILLS OR SWEATING ?*NAUSEA AND VOMITING THAT IS NOT CONTROLLED WITH YOUR NAUSEA MEDICATION ?*UNUSUAL SHORTNESS OF BREATH ?*UNUSUAL BRUISING OR BLEEDING ?*URINARY PROBLEMS (pain or burning when urinating, or frequent urination) ?*BOWEL PROBLEMS (unusual diarrhea, constipation, pain near the anus) ?TENDERNESS IN MOUTH AND THROAT WITH OR WITHOUT PRESENCE OF ULCERS (sore throat, sores in mouth, or a toothache) ?UNUSUAL RASH, SWELLING OR PAIN  ?UNUSUAL VAGINAL DISCHARGE OR ITCHING  ? ?Items with * indicate a potential emergency and should be followed up as soon as possible or go to the Emergency Department if any problems should occur. ? ?Please show the CHEMOTHERAPY ALERT CARD or IMMUNOTHERAPY ALERT CARD at  check-in to the Emergency Department and triage nurse. ? ?Should you have questions after your visit or need to cancel or reschedule your appointment, please contact [facility] CANCER [place] AT [place]  [phone] and follow the prompts.  Office hours are 8:00 a.m. to 4:30 p.m. Monday - Friday. Please note that voicemails left after 4:00 p.m. may not be returned until the following business day.  We are closed weekends and major holidays. You have access to a nurse at all times for urgent questions. Please call the main number to the clinic [phone] and follow the prompts. ? ?For any non-urgent questions, you may also contact your provider using MyChart. We now offer e-Visits for anyone 76 and older to request care online for non-urgent symptoms. For details visit [email or web address]. ?  ?Also download the MyChart app! Go to the app store, search "MyChart", open the app, select [facility], and log in with your MyChart username and password. ? ?Due to Covid, a mask is required upon entering the hospital/clinic. If you do not have a mask, one will be given to you upon arrival. For doctor visits, patients may have 1 support person aged 51 or older with them. For treatment visits, patients cannot have anyone with them due to current Covid guidelines and our immunocompromised population. Pertuzumab injection ?What is this medication? ?PERTUZUMAB (per TOOZ ue mab) is a monoclonal antibody. It is used to treat breast cancer. ?This medicine may be used for other purposes; ask your health care provider or pharmacist if you have questions. ?COMMON BRAND NAME(S): PERJETA ?What should I tell my care team before I take this  medication? ?They need to know if you have any of these conditions: ?heart disease ?heart failure ?high blood pressure ?history of irregular heart beat ?recent or ongoing radiation therapy ?an unusual or allergic reaction to pertuzumab, other medicines, foods, dyes, or  preservatives ?pregnant or trying to get pregnant ?breast-feeding ?How should I use this medication? ?This medicine is for infusion into a vein. It is given by a health care professional in a hospital or clinic setting. ?Talk to your pediatrician regarding the use of this medicine in children. Special care may be needed. ?Overdosage: If you think you have taken too much of this medicine contact a poison control center or emergency room at once. ?NOTE: This medicine is only for you. Do not share this medicine with others. ?What if I miss a dose? ?It is important not to miss your dose. Call your doctor or health care professional if you are unable to keep an appointment. ?What may interact with this medication? ?Interactions are not expected. ?Give your health care provider a list of all the medicines, herbs, non-prescription drugs, or dietary supplements you use. Also tell them if you smoke, drink alcohol, or use illegal drugs. Some items may interact with your medicine. ?This list may not describe all possible interactions. Give your health care provider a list of all the medicines, herbs, non-prescription drugs, or dietary supplements you use. Also tell them if you smoke, drink alcohol, or use illegal drugs. Some items may interact with your medicine. ?What should I watch for while using this medication? ?Your condition will be monitored carefully while you are receiving this medicine. Report any side effects. Continue your course of treatment even though you feel ill unless your doctor tells you to stop. ?Do not become pregnant while taking this medicine or for 7 months after stopping it. Women should inform their doctor if they wish to become pregnant or think they might be pregnant. Women of child-bearing potential will need to have a negative pregnancy test before starting this medicine. There is a potential for serious side effects to an unborn child. Talk to your health care professional or pharmacist for  more information. Do not breast-feed an infant while taking this medicine or for 7 months after stopping it. ?Women must use effective birth control with this medicine. ?Call your doctor or health care professional for advice if you get a fever, chills or sore throat, or other symptoms of a cold or flu. Do not treat yourself. Try to avoid being around people who are sick. ?You may experience fever, chills, and headache during the infusion. Report any side effects during the infusion to your health care professional. ?What side effects may I notice from receiving this medication? ?Side effects that you should report to your doctor or health care professional as soon as possible: ?breathing problems ?chest pain or palpitations ?dizziness ?feeling faint or lightheaded ?fever or chills ?skin rash, itching or hives ?sore throat ?swelling of the face, lips, or tongue ?swelling of the legs or ankles ?unusually weak or tired ?Side effects that usually do not require medical attention (report to your doctor or health care professional if they continue or are bothersome): ?diarrhea ?hair loss ?nausea, vomiting ?tiredness ?This list may not describe all possible side effects. Call your doctor for medical advice about side effects. You may report side effects to FDA at 1-800-FDA-1088. ?Where should I keep my medication? ?This drug is given in a hospital or clinic and will not be stored at home. ?NOTE: This sheet  is a summary. It may not cover all possible information. If you have questions about this medicine, talk to your doctor, pharmacist, or health care provider. ?? 2022 Elsevier/Gold Standard (2015-12-30 00:00:00) ?Trastuzumab injection for infusion ?What is this medication? ?TRASTUZUMAB (tras TOO zoo mab) is a monoclonal antibody. It is used to treat breast cancer and stomach cancer. ?This medicine may be used for other purposes; ask your health care provider or pharmacist if you have questions. ?COMMON BRAND NAME(S):  Herceptin, Belenda Cruise, Ogivri, Ontruzant, Trazimera ?What should I tell my care team before I take this medication? ?They need to know if you have any of these conditions: ?heart disease ?heart failure ?lung or breathing d

## 2022-03-14 NOTE — Progress Notes (Signed)
? ? ? ?Hematology/Oncology Consult note ?[facility]  ?[phone] [phone] ? ?Patient Care Team: ?[facility] as PCP - General ?[name], MD as Consulting Physician (Hematology and Oncology)  ? ?Name of the patient: Laura Santiago  ?[id number]  ?[birth year]  ? ?Date of visit: 03/14/22 ? 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  ? ?Chief complaint/ Reason for visit-on treatment assessment prior to cycle 11 of Herceptin and Perjeta ? ?Heme/Onc history: Patient is a 76 year old female with a past medical history significant for stage IV CKD, history of DVT on Xarelto, venous stasis and chronic right lower extremity ulceration hypertension among other medical problems.  She had a screening mammogram in September 2014 which showed 2.1 x 2.3 x 1.8 cm irregular mass in her right breast.  It was ER 10% positive PR negative and HER2 positive +3.  She received neoadjuvant chemotherapy with Taxol Herceptin and Perjeta for 4 cycles followed by dose dense AC/Herceptin x4 which she completed in March 2015.  She had a right lumpectomy on 04/03/2014 which showed scant residual invasive ductal carcinoma [id number].  She completed 1 year of adjuvant Herceptin chemotherapy and also completed adjuvant radiation treatment.  She was recommended anastrozole which she took on and off starting November 2015 and stopped sometime in 2020. ?  ?She was then hospitalized with neck pain and was found to have lytic lesions involving C5-C6 with pathological vertebral fractures.  She underwent radiation treatment to this area.  Image guided biopsy of the L1 vertebral body showed metastatic carcinoma consistent with breast origin ER 10% PR 0% and HER2 amplified ratio 5.1 average HER2 signal number per cell 15.0 average CEP 17 signals number per cell 3.0.  Baseline echocardiogram on 05/02/2021 showed a normal EF of 62% she was recommended Taxol  Herceptin and Perjeta which she received for 2 cycles at [facility] until June 10, 2021 ?  ?She has chronic pain from her bone metastases for which she is currently on oxycodone 10 mg every 4 hours as needed and 12 mcg fentanyl patch.  She was seeing pain clinic when she was living in [name]. ?  ?Patient has also been on Zometa when she was in [name] but she does have some ongoing dental issues.  She has received Xgeva in the past as well.  Her last Delton See was in July 2021.  Last PET scan was on 03/21/2021 which showed diffuse osseous metastatic disease involving the head neck chest abdomen and pelvis and spine.  Left lung apex hypermetabolic nodule and multiple hypermetabolic liver lesions concerning for disease involvement.  Enlarged hypermetabolic left inguinal lymph nodes along with hypermetabolic external iliac and left supraclavicular lymph nodes ?  ?Patient is now moved to [place] to be close to her daughter.  She lives in an independent living. ?Currently patient is getting Taxol Herceptin and Perjeta every 3 weeks with weekly Taxol ? ?Interval history-patient has chronic left lower extremity wounds which have remained unchanged.  She is doing better since her hospital discharge.  Her mental status is at her baseline.  She states that she wants to continue treatment but is concerned about potential burden to her family especially her daughter who has a newborn child ? ?ECOG PS- 2 ?Pain scale- 2 ? ? ?Review of systems- Review of Systems  ?Constitutional:  Positive for malaise/fatigue. Negative for chills, fever and weight loss.  ?HENT:  Negative for congestion, ear discharge and nosebleeds.   ?Eyes:  Negative for blurred vision.  ?Respiratory:  Negative for cough, hemoptysis, sputum production, shortness of breath and wheezing.   ?Cardiovascular:  Positive for leg swelling. Negative for chest pain, palpitations, orthopnea and claudication.  ?Gastrointestinal:  Negative for abdominal pain, blood in stool,  constipation, diarrhea, heartburn, melena, nausea and vomiting.  ?Genitourinary:  Negative for dysuria, flank pain, frequency, hematuria and urgency.  ?Musculoskeletal:  Negative for back pain, joint pain and myalgias.  ?Skin:  Negative for rash.  ?Neurological:  Negative for dizziness, tingling, focal weakness, seizures, weakness and headaches.  ?Endo/Heme/Allergies:  Does not bruise/bleed easily.  ?Psychiatric/Behavioral:  Negative for depression and suicidal ideas. The patient does not have insomnia.    ? ? ? ?Allergies  ?Allergen Reactions  ? Corticosteroids Other (See Comments)  ?  Pt trf from [name] and per primary md for her cancer tx. Notes that it causes agitation intolerance  ? Sulfa Antibiotics Other (See Comments)  ?  Pt moved from [name] and in MD notes she has allergy but we do not know reactions when taking the drug  ? Celebrex [Celecoxib] Rash  ? ? ? ?Past Medical History:  ?Diagnosis Date  ? ADHD (attention deficit hyperactivity disorder)   ? in [place], no date on md note  ? Anemia   ? IDA 11/26/2019, Anemia in stage 4 chronic kidney disease ([place]) 09/03/2020  ? Arthritis   ? osteoarthritis right knee 09/30/2014  ? Breast cancer ([place]) 10/05/2013  ? in [place], [place] -, Her 2 is 3+  ? Cancer related pain 03/28/2021  ? in [name], md notes spine mets  ? cervical compression fracture 03/18/2021  ? in [place]  ? DVT of lower extremity, bilateral ([place]) 03/30/2014  ? in [name]  ? Generalized muscle weakness 03/31/2016  ? in [name]  ? GERD (gastroesophageal reflux disease) 05/15/2013  ? per md in [name]  ? Hyperparathyroidism, secondary ([place]) 04/25/2018  ? in [name]  ? Hypertension 02/20/2016  ? info from MD in [name]  ? Memory loss 02/05/2015  ? in [name]  ? Metabolic acidosis 44/31/5400  ? in [place]  ? Metabolic syndrome 86/76/1952  ? in [name]  ? Osteopenia after menopause 03/29/2016  ? in [name]  ? 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 in [name]  ? Stasis ulcer of left lower extremity ([place]) 03/29/2016  ? in [name]  ? ? ? ?Past  Surgical History:  ?Procedure Laterality Date  ? CESAREAN SECTION    ? unknown  ? fibroid removed  N/A   ? in [place] - unknown date  ? IR FLUORO GUIDE CV LINE LEFT  07/27/2021  ? IR PORT REPAIR CENTRAL VENOUS ACCESS DEVICE Left   ? In [name]  ? MOHS SURGERY N/A 02/25/2014  ? in [name]  ? ovary removed     ? unknown  ? PORTA CATH INSERTION N/A 02/13/2022  ? Procedure: PORTA CATH INSERTION;  Surgeon: Algernon Huxley, MD;  Location: [facility];  Service: Cardiovascular;  Laterality: N/A;  ? [place] CATARACT EXTRACAP,INSERT LENS Bilateral  Bilateral 09/05/2012  ? in [place]    ? unknown  ?  LUMPECTOMY Right 04/03/2014  ? in [place]  ? ? ?Social History  ? ?Socioeconomic History  ? Marital status: Divorced  ?  Spouse name: Not on file  ? Number of children: Not on file  ? Years of education: Not on file  ? Highest education level: Not on file  ?Occupational History  ? Occupation: retired  occupational RN  ?  Comment: In [place]  ?Tobacco Use  ? Smoking status: Never  ? Smokeless tobacco: Never  ?Vaping Use  ? Vaping Use: Never used  ?Substance and Sexual Activity  ? Alcohol use: Not Currently  ? Drug use: Yes  ?  Comment: prescribed oxy, fentanyl patch  ? Sexual activity: Not Currently  ?Other Topics Concern  ? Not on file  ?Social History Narrative  ? Not on file  ? ?Social Determinants of Health  ? ?Financial Resource Strain: Not on file  ?Food Insecurity: Not on file  ?Transportation Needs: Not on file  ?Physical Activity: Not on file  ?Stress: Not on file  ?Social Connections: Not on file  ?Intimate Partner Violence: Not on file  ? ? ?Family History  ?Problem Relation Age of Onset  ? Pancreatic cancer Mother   ? Stroke Father   ? Diabetes Father   ? Hypertension Father   ? Heart disease Father   ? Skin cancer Father   ? Varicose Veins Father   ? Skin cancer Brother   ? Cancer - Prostate Brother   ? ? ? ?Current Outpatient Medications:  ?  acetaminophen (TYLENOL) 500 MG tablet, Take 500 mg by mouth  every 4 (four) hours as needed., Disp: , Rfl:  ?  amphetamine-dextroamphetamine (ADDERALL XR) 30 MG 24 hr capsule, Take 1 capsule (30 mg total) by mouth daily., Disp: 15 capsule, Rfl: 0 ?  calcium citrate (CALCITRATE - DOSED IN

## 2022-03-14 NOTE — Progress Notes (Signed)
? ?  ?Palliative Medicine ?[place] at [facility] ?[phone] [phone]) [phone] ? ? ?Name: Laura Santiago ?Date: 03/14/2022 ?MRN: [id number]  ?DOB: 26-Jul-1946 ? ?Patient Care Team: ?[place] as PCP - General ?[name], MD as Consulting Physician (Hematology and Oncology)  ? ? ?REASON FOR CONSULTATION: ?Laura Santiago is a 76 y.o. 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 Patient has known widespread skeletal metastases.  She has previous RT to hip.  Patient has history of pathologic spinal fractures.  Palliative care was consulted to help address goals. ? ?SOCIAL HISTORY:    ? reports that she has never smoked. She has never used smokeless tobacco. She reports that she does not currently use alcohol. She reports current drug use. ? ?Patient lives at [facility].  She has a daughter who is involved.  Patient is retired Marine scientist. ? ?ADVANCE DIRECTIVES:  ?On file ? ?CODE STATUS: DNR (DNR form signed on 03/14/22) ? ?PAST MEDICAL HISTORY: ?Past Medical History:  ?Diagnosis Date  ? ADHD (attention deficit hyperactivity disorder)   ? in [place], no date on md note  ? Anemia   ? IDA 11/26/2019, Anemia in stage 4 chronic kidney disease ([place]) 09/03/2020  ? Arthritis   ? osteoarthritis right knee 09/30/2014  ? Breast cancer ([place]) 10/05/2013  ? in [place], [place] -, Her 2 is 3+  ? Cancer related pain 03/28/2021  ? in [name], md notes spine mets  ? cervical compression fracture 03/18/2021  ? in [place]  ? DVT of lower extremity, bilateral ([place]) 03/30/2014  ? in [name]  ? Generalized muscle weakness 03/31/2016  ? in [name]  ? GERD (gastroesophageal reflux disease) 05/15/2013  ? per md in [name]  ? Hyperparathyroidism, secondary ([place]) 04/25/2018  ? in [name]  ? Hypertension 02/20/2016  ? info from MD in [name]  ? Memory  loss 02/05/2015  ? in [name]  ? Metabolic acidosis 41/63/8453  ? in [place]  ? Metabolic syndrome 64/68/0321  ? in [name]  ? Osteopenia after menopause 03/29/2016  ? in [name]  ? 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 in [name]  ? Stasis ulcer of left lower extremity ([place]) 03/29/2016  ? in [name]  ? ? ?PAST SURGICAL HISTORY:  ?Past Surgical History:  ?Procedure Laterality Date  ? CESAREAN SECTION    ? unknown  ? fibroid removed  N/A   ? in [place] - unknown date  ? IR FLUORO GUIDE CV LINE LEFT  07/27/2021  ? IR PORT REPAIR CENTRAL VENOUS ACCESS DEVICE Left   ? In [name]  ? MOHS SURGERY N/A 02/25/2014  ? in [name]  ? ovary removed     ? unknown  ? PORTA CATH INSERTION N/A 02/13/2022  ? Procedure: PORTA CATH INSERTION;  Surgeon: Algernon Huxley, MD;  Location: [facility];  Service: Cardiovascular;  Laterality: N/A;  ? [place] CATARACT EXTRACAP,INSERT LENS Bilateral  Bilateral 09/05/2012  ? in [place]    ? unknown  ?  LUMPECTOMY Right 04/03/2014  ? in [place]  ? ? ?HEMATOLOGY/ONCOLOGY HISTORY:  ?Oncology History  ?Primary malignant neoplasm of breast with metastasis ([place])  ?2014 Initial Diagnosis  ? history of breast cancer diagnosed in 2014 s/p chemotherapy with Herceptin and lumpectomy, radiation and antiestrogen therapy that was completed in 2020 (in [name]) ?  ?  03/18/2021 - 03/24/2021 Hospital Admission  ? New neck and shoulder pain: Scans revealed lytic lesions C5-C6 with pathological compression fractures (developed steroid-induced mania) status post 1 round of radiation (Dr. Sondra Come in [place] was her medical oncologist) ?  ?04/19/2021 Relapse/Recurrence  ? Biopsy of L1 vertebral body on 04/19/21 showed metastatic carcinoma consistent with breast origin involving bone: ER 10%, PR 0%, HER2 ratio 5.1) ?  ?05/06/2021 -  Chemotherapy  ? Taxol (weekly) Herceptin Perjeta palliative chemotherapy started in [name], moved to be closer to her family in [place] ? ?  ? ?  ?06/28/2021 Cancer Staging  ? Staging form: Breast, AJCC 8th  Edition ?- Clinical: Stage IV (pM1, ER+, PR-, HER2+) - Signed by [name], MD on 06/28/2021 ? ?  ?07/01/2021 -  Chemotherapy  ? Patient is on Treatment Plan : BREAST Weekly Paclitaxel + Trastuzumab + Pertuzumab q21d x 8 cycles / Trastuzumab + Pertuzumab q21d x 4 cycles  ?   ? ? ?ALLERGIES:  is allergic to corticosteroids, sulfa antibiotics, and celebrex [celecoxib]. ? ?MEDICATIONS:  ?Current Outpatient Medications  ?Medication Sig Dispense Refill  ? acetaminophen (TYLENOL) 500 MG tablet Take 500 mg by mouth every 4 (four) hours as needed.    ? amphetamine-dextroamphetamine (ADDERALL XR) 30 MG 24 hr capsule Take 1 capsule (30 mg total) by mouth daily. 15 capsule 0  ? calcium citrate (CALCITRATE - DOSED IN MG ELEMENTAL CALCIUM) 950 (200 Ca) MG tablet Take 200 mg of elemental calcium by mouth daily.    ? DULoxetine (CYMBALTA) 30 MG capsule Take by mouth.    ? fentaNYL (DURAGESIC) 25 MCG/HR Place 1 patch onto the skin every 3 (three) days. 5 patch 0  ? gabapentin (NEURONTIN) 100 MG capsule Take by mouth.    ? gentamicin cream (GARAMYCIN) 0.1 % SMARTSIG:1 Application Topical (Patient not taking: Reported on 03/14/2022)    ? hydrOXYzine (ATARAX) 25 MG tablet Take by mouth.    ? lisinopril (ZESTRIL) 20 MG tablet Take 1 tablet (20 mg total) by mouth daily. 30 tablet 3  ? Multiple Vitamin (MULTI-VITAMIN) tablet Take 1 tablet by mouth daily.    ? naloxone (NARCAN) nasal spray 4 mg/0.1 mL SPRAY 1 SPRAY INTO ONE NOSTRIL AS DIRECTED FOR OPIOID OVERDOSE (TURN PERSON ON SIDE AFTER DOSE. IF NO RESPONSE IN 2-3 MINUTES OR PERSON RESPONDS BUT RELAPSES, REPEAT USING A NEW SPRAY DEVICE AND SPRAY INTO THE OTHER NOSTRIL. CALL 911 AFTER USE.) * EMERGENCY USE ONLY * (Patient not taking: Reported on 03/14/2022) 1 each 0  ? ondansetron (ZOFRAN) 8 MG tablet Take by mouth.    ? Oxycodone HCl 10 MG TABS TAKE 1 TABLET BY MOUTH EVERY 4 HOURS AS NEEDED 90 tablet 0  ? rivaroxaban (XARELTO) 20 MG TABS tablet Take 1 tablet (20 mg total) by mouth daily  with supper. 30 tablet 3  ? sodium hypochlorite (DAKIN'S 1/4 STRENGTH) 0.125 % SOLN Apply 1 application topically as directed. Moisten gauze with solution and wrap wound    ? ?No current facility-administered medications for this visit.  ? ?Facility-Administered Medications Ordered in Other Visits  ?Medication Dose Route Frequency Provider Last Rate Last Admin  ? heparin lock flush 100 UNIT/ML injection           ? sodium chloride flush (NS) 0.9 % injection 10 mL  10 mL Intracatheter PRN [name], MD      ? ? ?VITAL SIGNS: ?There were no vitals taken for this visit. ?There were no vitals filed for this  visit.  ?Estimated body mass index is 25.29 kg/m? as calculated from the following: ?  Height as of 03/02/22: $RemoveBef'5\' 5"'nnHhrUdIFo$  (1.651 m). ?  Weight as of 03/08/22: 152 lb (68.9 kg). ? ?LABS: ?CBC: ?   ?Component Value Date/Time  ? WBC 7.0 03/14/2022 0930  ? HGB 8.9 (L) 03/14/2022 0930  ? HCT 29.7 (L) 03/14/2022 0930  ? PLT 280 03/14/2022 0930  ? MCV 87.9 03/14/2022 0930  ? NEUTROABS 4.8 03/14/2022 0930  ? LYMPHSABS 1.5 03/14/2022 0930  ? MONOABS 0.5 03/14/2022 0930  ? EOSABS 0.2 03/14/2022 0930  ? BASOSABS 0.0 03/14/2022 0930  ? ?Comprehensive Metabolic Panel: ?   ?Component Value Date/Time  ? NA 137 03/14/2022 0930  ? K 4.3 03/14/2022 0930  ? CL 103 03/14/2022 0930  ? CO2 25 03/14/2022 0930  ? BUN 33 (H) 03/14/2022 0930  ? CREATININE 1.90 (H) 03/14/2022 0930  ? GLUCOSE 108 (H) 03/14/2022 0930  ? CALCIUM 8.7 (L) 03/14/2022 0930  ? AST 20 03/14/2022 0930  ? ALT 10 03/14/2022 0930  ? ALKPHOS 61 03/14/2022 0930  ? BILITOT 0.3 03/14/2022 0930  ? PROT 7.6 03/14/2022 0930  ? ALBUMIN 3.3 (L) 03/14/2022 0930  ? ? ?RADIOGRAPHIC STUDIES: ?DG Thoracic Spine 2 View ? ?Result Date: 02/24/2022 ?CLINICAL DATA:  Mid back pain. EXAM: THORACIC SPINE 2 VIEWS COMPARISON:  None. FINDINGS: There is no evidence of an acute thoracic spine fracture. Exaggerated kyphosis of the thoracic spine is seen with mild levoscoliosis of the visualized portion  of the lumbar spine. There is marked severity multilevel endplate sclerosis with associated moderate severity lateral osteophyte formation. Moderate to marked severity multilevel intervertebral disc space narro

## 2022-03-14 NOTE — Progress Notes (Signed)
Pt states she feels llike she is getting better; but believes her daughter does not want her to get better. Pt believes that her PCP does not "wan to take her in as a patient due to her medications (fentyl patches). Pt seems "sad and down" due to her daughter pushing her to go to [place] and she does not want to go. She will like to speak to [facility] in regards to her pain meds and their prices ?

## 2022-03-15 ENCOUNTER — Encounter: Payer: Self-pay | Admitting: Oncology

## 2022-03-15 ENCOUNTER — Telehealth: Payer: Self-pay | Admitting: Student

## 2022-03-15 ENCOUNTER — Encounter: Payer: Medicare Other | Attending: Internal Medicine | Admitting: Internal Medicine

## 2022-03-15 ENCOUNTER — Other Ambulatory Visit
Admission: RE | Admit: 2022-03-15 | Discharge: 2022-03-15 | Disposition: A | Discharge status: Home / Self Care | Payer: Medicare Other | Source: Ambulatory Visit | Attending: Internal Medicine | Admitting: Internal Medicine

## 2022-03-15 DIAGNOSIS — B999 Unspecified infectious disease: Secondary | ICD-10-CM | POA: Diagnosis present

## 2022-03-15 DIAGNOSIS — Z86718 Personal history of other venous thrombosis and embolism: Secondary | ICD-10-CM | POA: Diagnosis not present

## 2022-03-15 DIAGNOSIS — N184 Chronic kidney disease, stage 4 (severe): Secondary | ICD-10-CM | POA: Insufficient documentation

## 2022-03-15 DIAGNOSIS — I129 Hypertensive chronic kidney disease with stage 1 through stage 4 chronic kidney disease, or unspecified chronic kidney disease: Secondary | ICD-10-CM | POA: Insufficient documentation

## 2022-03-15 DIAGNOSIS — C79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: Secondary | ICD-10-CM | POA: Insufficient documentation

## 2022-03-15 DIAGNOSIS — X58XXXD Exposure to other specified factors, subsequent encounter: Secondary | ICD-10-CM | POA: Diagnosis not present

## 2022-03-15 DIAGNOSIS — I872 Venous insufficiency (chronic) (peripheral): Secondary | ICD-10-CM

## 2022-03-15 DIAGNOSIS — I87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: Secondary | ICD-10-CM | POA: Diagnosis not present

## 2022-03-15 DIAGNOSIS — L97822 Non-pressure chronic ulcer of other part of left lower leg with fat layer exposed: Secondary | ICD-10-CM | POA: Diagnosis not present

## 2022-03-15 DIAGNOSIS — S91201D Unspecified open wound of right great toe with damage to nail, subsequent encounter: Secondary | ICD-10-CM | POA: Diagnosis not present

## 2022-03-15 DIAGNOSIS — Z7901 Long term (current) use of anticoagulants: Secondary | ICD-10-CM | POA: Diagnosis not present

## 2022-03-15 DIAGNOSIS — F909 Attention-deficit hyperactivity disorder, unspecified type: Secondary | ICD-10-CM | POA: Insufficient documentation

## 2022-03-15 NOTE — Telephone Encounter (Signed)
Attempted to contact patient's daughter [name] to offer to schedule a Palliative Consult, no answer - left message with reason for call along with my name and call back number. ?

## 2022-03-16 ENCOUNTER — Encounter: Payer: Self-pay | Admitting: Oncology

## 2022-03-16 ENCOUNTER — Telehealth: Payer: Self-pay | Admitting: Student

## 2022-03-16 NOTE — Progress Notes (Signed)
GERALDYNE, BARRACLOUGH ([id number]) ?Visit Report for 03/15/2022 ?Arrival Information Details ?Patient Name: Laura Santiago, Laura Santiago. ?Date of Service: 03/15/2022 9:00 AM ?Medical Record Number: [id number] ?Patient Account Number: [id number] ?Date of Birth/Sex: 02-Jan-1946 (76 y.o. F) ?Treating RN: Cornell Barman ?Primary Care Orson Rho: [facility], [place] Other Clinician: ?Referring Kastiel Simonian: [facility] ?Treating Isaid Salvia/Extender: Kalman Shan ?Weeks in Treatment: 36 ?Visit Information History Since Last Visit ?Pain Present Now: Yes ?Patient Arrived: [name] ?Arrival Time: 09:21 ?Accompanied By: self ?Transfer Assistance: None ?Patient Identification Verified: Yes ?Secondary Verification Process Completed: Yes ?Patient Has Alerts: Yes ?Patient Alerts: PT HAS SERVICE ANIMAL ?ABI 07/11/21 ?R) 1.16 L) 1.27 ?Electronic Signature(s) ?Signed: 03/16/2022 9:05:44 AM By: Gretta Cool, BSN, RN, CWS, [name] RN, BSN ?Entered By: Gretta Cool, BSN, RN, CWS, [name] on 03/15/2022 09:22:12 ?Laura Santiago, Laura Santiago. ([id number]) ?-------------------------------------------------------------------------------- ?Encounter Discharge Information Details ?Patient Name: Laura Santiago, Laura Santiago. ?Date of Service: 03/15/2022 9:00 AM ?Medical Record Number: [id number] ?Patient Account Number: [id number] ?Date of Birth/Sex: 05-26-1946 (76 y.o. F) ?Treating RN: Cornell Barman ?Primary Care Willford Rabideau: [facility], [place] Other Clinician: ?Referring Lucette Kratz: [facility] ?Treating Phyllis [place]/Extender: Kalman Shan ?Weeks in Treatment: 36 ?Encounter Discharge Information Items Post Procedure Vitals ?Discharge Condition: Stable ?Temperature (?F): 98.5 ?Ambulatory Status: [name] ?Pulse (bpm): 94 ?Discharge Destination: Home ?Respiratory Rate (breaths/min): 16 ?Transportation: Private Auto ?Blood Pressure (mmHg): 104/58 ?Accompanied By: self ?Schedule Follow-up Appointment: Yes ?Clinical Summary of Care: ?Electronic Signature(s) ?Signed: 03/16/2022 9:05:44 AM By: Gretta Cool, BSN, RN, CWS, [name] RN,  BSN ?Entered By: Gretta Cool, BSN, RN, CWS, [name] on 03/15/2022 10:14:16 ?Laura Santiago, Laura Santiago. ([id number]) ?-------------------------------------------------------------------------------- ?Lower Extremity Assessment Details ?Patient Name: Laura Santiago, Laura Santiago. ?Date of Service: 03/15/2022 9:00 AM ?Medical Record Number: [id number] ?Patient Account Number: [id number] ?Date of Birth/Sex: 18-Apr-1946 (76 y.o. F) ?Treating RN: Cornell Barman ?Primary Care Haeli Gerlich: [facility], [place] Other Clinician: ?Referring Landyn Buckalew: [facility] ?Treating Carmella Kees/Extender: Kalman Shan ?Weeks in Treatment: 36 ?Edema Assessment ?Assessed: [Left: No] [Right: No] ?[Left: Edema] [Right: :] ?Calf ?Left: Right: ?Point of Measurement: 29 cm From Medial Instep 36 cm ?Ankle ?Left: Right: ?Point of Measurement: 10 cm From Medial Instep 26.5 cm ?Vascular Assessment ?Pulses: ?Dorsalis Pedis ?Palpable: [Left:Yes] ?Notes ?Leg is red and warm to touch around wounded area ?Electronic Signature(s) ?Signed: 03/16/2022 9:05:44 AM By: Gretta Cool, BSN, RN, CWS, [name] RN, BSN ?Entered By: Gretta Cool, BSN, RN, CWS, [name] on 03/15/2022 09:37:44 ?Laura Santiago, Laura Santiago. ([id number]) ?-------------------------------------------------------------------------------- ?Multi Wound Chart Details ?Patient Name: Laura Santiago, Laura Santiago. ?Date of Service: 03/15/2022 9:00 AM ?Medical Record Number: [id number] ?Patient Account Number: [id number] ?Date of Birth/Sex: [birth year] (76 y.o. F) ?Treating RN: Cornell Barman ?Primary Care Gazella Anglin: [facility], [place] Other Clinician: ?Referring Melony Tenpas: [facility] ?Treating Aidee Latimore/Extender: Kalman Shan ?Weeks in Treatment: 36 ?Vital Signs ?Height(in): 66 ?Pulse(bpm): 94 ?Weight(lbs): 153 ?Blood Pressure(mmHg): 104/58 ?Body Mass Index(BMI): 24.7 ?Temperature(??F): 98.5 ?Respiratory Rate(breaths/min): 18 ?Photos: [N/A:N/A] ?Wound Location: Left, Medial Lower Leg Left, Lateral Lower Leg N/A ?Wounding Event: Gradually Appeared Gradually Appeared  N/A ?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 ?Comorbid History: Hypertension, Osteoarthritis, Hypertension, Osteoarthritis, N/A ?Received Chemotherapy, Received Received Chemotherapy, Received ?Radiation Radiation ?Date Acquired: 04/06/2021 03/12/2022 N/A ?Weeks of Treatment: 36 0 N/A ?Wound Status: Open Open N/A ?Wound Recurrence: No No N/A ?Clustered Wound: Yes No N/A ?Measurements L x W x D (cm) 11x13x0.2 2.2x3.5x0.2 N/A ?Area (cm?) : 112.312 6.048 N/A ?Volume (cm?) : 22.462 1.21 N/A ?% Reduction in Area: -24.30% 0.00% N/A ?% Reduction in Volume: -24.30% 0.00% N/A ?Classification: Full Thickness Without Exposed Full Thickness Without Exposed N/A ?Support Structures  Support Structures ?Exudate Amount: Large Large N/A ?Exudate Type: Serous Serous N/A ?Exudate Color: amber amber N/A ?Wound Margin: Flat and Intact Flat and Intact N/A ?Granulation Amount: Small (1-33%) None Present (0%) N/A ?Granulation Quality: Red, Pink N/A N/A ?Necrotic Amount: Large (67-100%) Large (67-100%) N/A ?Exposed Structures: ?Fat Layer (Subcutaneous Tissue): ?Fat Layer (Subcutaneous Tissue): N/A ?Yes Yes ?Fascia: No ?Fascia: No ?Tendon: No ?Tendon: No ?Muscle: No ?Muscle: No ?Joint: No ?Joint: No ?Bone: No ?Bone: No ?Epithelialization: Small (1-33%) N/A N/A ?Assessment Notes: Blue=green drainage coming from N/A N/A ?wound. ?Treatment Notes ?Electronic Signature(s) ?Signed: 03/16/2022 9:05:44 AM By: Gretta Cool, BSN, RN, CWS, [name] RN, BSN ?Laura Santiago, Laura Santiago ([id number]) ?Entered By: Gretta Cool, BSN, RN, CWS, [name] on 03/15/2022 09:48:33 ?Laura Santiago, Laura Santiago. ([id number]) ?-------------------------------------------------------------------------------- ?Multi-Disciplinary Care Plan Details ?Patient Name: Laura Santiago, Laura Santiago. ?Date of Service: 03/15/2022 9:00 AM ?Medical Record Number: [id number] ?Patient Account Number: [id number] ?Date of Birth/Sex: 06/08/1946 (76 y.o. F) ?Treating RN: Cornell Barman ?Primary Care Reino Lybbert: [facility], [place] Other  Clinician: ?Referring Jancie Kercher: [facility] ?Treating Sherae Santino/Extender: Kalman Shan ?Weeks in Treatment: 36 ?Active Inactive ?Soft Tissue Infection ?Nursing Diagnoses: ?Impaired tissue integrity ?Potential for infection: soft tissue ?Goals: ?Patient's soft tissue infection will resolve ?Date Initiated: 03/15/2022 ?Target Resolution Date: 03/28/2022 ?Goal Status: Active ?Signs and symptoms of infection will be recognized early to allow for prompt treatment ?Date Initiated: 03/15/2022 ?Target Resolution Date: 03/28/2022 ?Goal Status: Active ?Interventions: ?Assess signs and symptoms of infection every visit ?Treatment Activities: ?Culture and sensitivity : 03/15/2022 ?Notes: ?Wound/Skin Impairment ?Nursing Diagnoses: ?Impaired tissue integrity ?Goals: ?Patient/caregiver will verbalize understanding of skin care regimen ?Date Initiated: 07/06/2021 ?Date Inactivated: 07/27/2021 ?Target Resolution Date: 07/06/2021 ?Goal Status: Met ?Ulcer/skin breakdown will have a volume reduction of 30% by week 4 ?Date Initiated: 07/06/2021 ?Date Inactivated: 10/12/2021 ?Target Resolution Date: 08/06/2021 ?Goal Status: Unmet ?Unmet Reason: cont tx ?Ulcer/skin breakdown will have a volume reduction of 50% by week 8 ?Date Initiated: 07/06/2021 ?Target Resolution Date: 09/06/2021 ?Goal Status: Active ?Ulcer/skin breakdown will have a volume reduction of 80% by week 12 ?Date Initiated: 07/06/2021 ?Target Resolution Date: 10/06/2021 ?Goal Status: Active ?Ulcer/skin breakdown will heal within 14 weeks ?Date Initiated: 07/06/2021 ?Target Resolution Date: 11/06/2021 ?Goal Status: Active ?Interventions: ?Assess patient/caregiver ability to obtain necessary supplies ?Assess patient/caregiver ability to perform ulcer/skin care regimen upon admission and as needed ?Assess ulceration(s) every visit ?Treatment Activities: ?Referred to DME Laiyah Exline for dressing supplies : 07/06/2021 ?Skin care regimen initiated : 07/06/2021 ?Notes: ?Laura Santiago, Laura Santiago  ([id number]) ?Electronic Signature(s) ?Signed: 03/16/2022 9:05:44 AM By: Gretta Cool, BSN, RN, CWS, [name] RN, BSN ?Entered By: Gretta Cool, BSN, RN, CWS, [name] on 03/15/2022 09:48:20 ?Laura Santiago, Laura Santiago. ([id number]) ?-------------------

## 2022-03-16 NOTE — Progress Notes (Signed)
MUNTAHA, VERMETTE ([id number]) ?Visit Report for 03/15/2022 ?Chief Complaint Document Details ?Patient Name: Laura Santiago, Laura Santiago. ?Date of Service: 03/15/2022 9:00 AM ?Medical Record Number: [id number] ?Patient Account Number: [id number] ?Date of Birth/Sex: 04-15-1946 (76 y.o. F) ?Treating RN: Cornell Barman ?Primary Care Provider: [facility] [place], [place] Other Clinician: ?Referring Provider: [facility] ?Treating Provider/Extender: Kalman Shan ?Weeks in Treatment: 36 ?Information Obtained from: Patient ?Chief Complaint ?Left lower extremity wound ?Right toe wounds ?Left upper lateral thigh wounds ?Electronic Signature(s) ?Signed: 03/15/2022 11:05:49 AM By: Kalman Shan DO ?Entered By: Kalman Shan on 03/15/2022 10:50:54 ?Laura Santiago, Laura Santiago. ([id number]) ?-------------------------------------------------------------------------------- ?Debridement Details ?Patient Name: Laura Santiago, Laura Santiago. ?Date of Service: 03/15/2022 9:00 AM ?Medical Record Number: [id number] ?Patient Account Number: [id number] ?Date of Birth/Sex: 10/27/1946 (76 y.o. F) ?Treating RN: Cornell Barman ?Primary Care Provider: [facility] [place], [place] Other Clinician: ?Referring Provider: [facility] ?Treating Provider/Extender: Kalman Shan ?Weeks in Treatment: 36 ?Debridement Performed for ?Wound #1 Left,Medial Lower Leg ?Assessment: ?Performed By: Physician Kalman Shan, MD ?Debridement Type: Debridement ?Severity of Tissue Pre Debridement: Fat layer exposed ?Level of Consciousness (Pre- ?Awake and Alert ?procedure): ?Pre-procedure Verification/Time Out ?Yes - 09:45 ?Taken: ?Total Area Debrided (L x W): 11 (cm) x 13 (cm) = 143 (cm?) ?Tissue and other material ?Viable, Non-Viable, Slough, Subcutaneous, [place] ?debrided: ?Level: Skin/Subcutaneous Tissue ?Debridement Description: Excisional ?Instrument: Curette ?Specimen: Swab, ?Number of Specimens Taken: 1 ?Bleeding: Minimum ?Hemostasis Achieved: Pressure ?Response to Treatment: Procedure was  tolerated well ?Level of Consciousness (Post- ?Awake and Alert ?procedure): ?Post Debridement Measurements of Total Wound ?Length: (cm) 11 ?Width: (cm) 13 ?Depth: (cm) 0.3 ?Volume: (cm?) 33.694 ?Character of Wound/Ulcer Post Debridement: Stable ?Severity of Tissue Post Debridement: Fat layer exposed ?Post Procedure Diagnosis ?Same as Pre-procedure ?Electronic Signature(s) ?Signed: 03/15/2022 11:05:49 AM By: Kalman Shan DO ?Signed: 03/16/2022 9:05:44 AM By: Gretta Cool, BSN, RN, CWS, [name] RN, BSN ?Entered By: Gretta Cool, BSN, RN, CWS, [name] on 03/15/2022 09:57:16 ?Laura Santiago, Laura Santiago. ([id number]) ?-------------------------------------------------------------------------------- ?HPI Details ?Patient Name: Laura Santiago, Laura Santiago. ?Date of Service: 03/15/2022 9:00 AM ?Medical Record Number: [id number] ?Patient Account Number: [id number] ?Date of Birth/Sex: 04/20/1946 (76 y.o. F) ?Treating RN: Cornell Barman ?Primary Care Provider: [facility], [place] Other Clinician: ?Referring Provider: [facility] ?Treating Provider/Extender: Kalman Shan ?Weeks in Treatment: 36 ?History of Present Illness ?HPI Description: Admission 7/27 ?Ms. Laura Santiago is a 76 year old female with a past medical history of ADHD, metastatic breast cancer, stage IV chronic kidney disease, ?history of DVT on Xarelto and chronic venous insufficiency that presents to the clinic for a chronic left lower extremity wound. She recently moved ?to [facility] 4 days ago. She was being followed by wound care center in [name]. She reports a 10-year history of wounds to her left ?lower extremity that eventually do heal with debridement and compression therapy. She states that the current wound reopened 4 months ago ?and she is using Vaseline and Coban. She denies signs of infection. ?8/3; patient presents for 1 week follow-up. She reports no issues or complaints today. She states she had vascular studies done in the last week. ?She denies signs of infection. She  brought her little service dog with her today. ?8/17; patient presents for follow-up. She has missed her last clinic appointment. She states she took the wrap off and attempted to rewrap her ?leg. She is having difficulty with transportation. She has her service dog with her today. Overall she feels well and reports improvement in wound ?healing. She denies signs of infection. She reports owning an old Velcro wrap  compression and has this at her living facility ?9/14; patient presents for follow-up. Patient states that over the past 2 to 3 weeks she developed toe wounds to her right foot. She attributes this ?to tight fitting shoes. She subsequently developed cellulitis in the right leg and has been treated by doxycycline by her oncologist. She reports ?improvement in symptoms however continues to have some redness and swelling to this leg. ?To the left lower extremity patient has been having her wraps changed with home health twice weekly. She states that the [facility] is not ?helping control the drainage. Other than that she has no issues or complaints today. She denies signs of infection to the left lower extremity. ?9/21; patient presents for follow-up. She reports seeing infectious disease for her cellulitis. She reports no further management. She has home ?health that changes the wraps twice weekly. She has no issues or complaints today. She denies signs of infection. ?10/5; patient presents for follow-up. She has no issues or complaints today. She denies signs of infection. She states that the right great toe has ?not been dressed by home health. ?10/12; patient presents for follow-up. She has no issues or complaints today. She reports improvement in her wound healing. She has been using ?silver alginate to the right great toe wound. She denies signs of infection. ?10/26; patient presents for follow-up. Home health did not have sorbact so they continued to use Hydrofera Blue under the wrap. She has  been ?using silver alginate to the great toe wound however she did not have a dressing in place today. She currently denies signs of infection. ?11/2; patient presents for follow-up. She has been using sorb act under the compression wrap. She reports using silver alginate to the toe wound ?again she does not have a dressing in place. She currently denies signs of infection. ?11/23; patient presents for follow-up. Unfortunately she has missed her last 2 clinic appointments. She was last seen 3 weeks ago. She did her ?own compression wrap with Kerlix and Coban yesterday after seeing vein and vascular. She has not been dressing her right great toe wound. She ?currently denies signs of infection. ?11/30; patient presents for 1 week follow-up. She states she changed her dressing last week prior to home health and use sorb act with Dakin's ?and Hydrofera Blue. Home health has changed the dressing as well and they have been using sorbact. Today she reports increased redness to her ?right lower extremity. She has a history of cellulitis to this leg. She has been using silver alginate to the right great toe. Unfortunately she had an ?episode of diarrhea prior to coming in and had feces all over the right leg and to the wrap of her left leg. ?12/7; patient presents for 1 week follow-up. She states that home health did not come out to change the dressing and she took it off yesterday. It ?is unclear if she is dressing the right toe wound. She denies signs of infection. ?12/14; patient presents for 1 week follow-up. She has no issues or complaints today. ?12/21; patient presents for follow-up. She has no issues or complaints today. She denies signs of infection. ?12/28/2021; patient presents for follow-up. She was hospitalized for sepsis secondary to right lower extremity cellulitis On 12/23. She states she is ?currently at a SNF. She states that she was started on doxycycline this morning for her right great toe swelling and  redness. She is not sure what ?dressings have been done to her left lower extremity for  the past 3 weeks. She says its been mainly gauze with an Ace wrap. ?1/25; patient presents for follow-up. She is still res

## 2022-03-16 NOTE — Telephone Encounter (Signed)
Spoke with daughter [name], regarding the Palliative referral/services and all questions were answered and she was in agreement with scheduling visit.  I have scheduled an In-home Consult for 03/28/22 @ 11 AM ?

## 2022-03-17 ENCOUNTER — Encounter: Payer: Self-pay | Admitting: Licensed Clinical Social Worker

## 2022-03-17 NOTE — Progress Notes (Signed)
[facility] Clinical Social Work  ?Initial Assessment ? ? ?Laura Santiago is a 76 y.o. year old female contacted by phone. Clinical Social Work was referred by medical provider for assessment of psychosocial needs.  ? ?SDOH (Social Determinants of Health) assessments performed: Yes ?SDOH Interventions   ? ?Flowsheet Row Most Recent Value  ?SDOH Interventions   ?Food Insecurity Interventions Intervention Not Indicated  ?Financial Strain Interventions Intervention Not Indicated  ?Housing Interventions Intervention Not Indicated  ?Intimate Partner Violence Interventions Intervention Not Indicated  ?Physical Activity Interventions Intervention Not Indicated  ?Stress Interventions Intervention Not Indicated  ?Social Connections Interventions Intervention Not Indicated  ?Transportation Interventions Intervention Not Indicated  ? ?  ?  ?Distress Screen completed: No ?   ? View : No data to display.  ?  ?  ?  ? ? ? ? ?Family/Social Information:  ?Housing Arrangement: patient lives alone, is resident at [place]. ?Family members/support persons in your life? Family, Social research officer, government, and Community  ?Transportation concerns: no  ?Employment: Retired. Income source: Public affairs consultant and [employer] ?Financial concerns: No ?Type of concern: None ?Food access concerns: no ?Religious or spiritual practice: N/A ?Services Currently in place:  Medicare ? ?Coping/ Adjustment to diagnosis: ?Patient understands treatment plan and what happens next? yes ?Concerns about diagnosis and/or treatment: I'm not especially worried about anything ?Patient reported stressors: Anxiety and Adjusting to my illness ?Hopes and priorities: N/A ?Patient enjoys watching TV and time with family/ friends ?Current coping skills/ strengths: Financial means  ? ? ? SUMMARY: ?Current SDOH Barriers:  ?Mental Health Concerns  ? ?Clinical Social Work Clinical Goal(s):  ?patient will work with SW to address concerns related to private care  giving. ? ?Interventions: ?Discussed common feeling and emotions when being diagnosed with cancer, and the importance of support during treatment ?Informed patient of the support team roles and support services at [facility] ?Provided CSW contact information and encouraged patient to call with any questions or concerns ?Provided patient with information about CSW role in patient care, other available resource, private duty caregiver information, and information on [facility] events. ? ? ?Follow Up Plan: Patient will contact CSW with any support or resource needs ?Patient verbalizes understanding of plan: Yes ? ? ? ?Carrianne Hyun, LCSW ?

## 2022-03-18 LAB — AEROBIC CULTURE W GRAM STAIN (SUPERFICIAL SPECIMEN): Gram Stain: NONE SEEN

## 2022-03-22 ENCOUNTER — Other Ambulatory Visit: Payer: Self-pay | Admitting: *Deleted

## 2022-03-22 ENCOUNTER — Encounter: Payer: Medicare Other | Admitting: Internal Medicine

## 2022-03-22 DIAGNOSIS — I87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: Secondary | ICD-10-CM | POA: Diagnosis not present

## 2022-03-22 MED ORDER — FENTANYL 25 MCG/HR TD PT72: 1.0000 | MEDICATED_PATCH | TRANSDERMAL | 0 refills | Status: DC

## 2022-03-22 NOTE — Progress Notes (Signed)
KASSIA, DEMARINIS ([id number]) ?Visit Report for 03/22/2022 ?Arrival Information Details ?Patient Name: Laura Santiago, Laura Santiago. ?Date of Service: 03/22/2022 10:30 AM ?Medical Record Number: [id number] ?Patient Account Number: [id number] ?Date of Birth/Sex: 12/19/1945 (76 y.o. F) ?Treating RN: Levora Dredge ?Primary Care Ramona Slinger: [facility], [place] Other Clinician: ?Referring Foch Rosenwald: [facility] ?Treating Donavon Kimrey/Extender: Ricard Dillon ?Weeks in Treatment: 42 ?Visit Information History Since Last Visit ?Added or deleted any medications: No ?Patient Arrived: [name] ?Any new allergies or adverse reactions: No ?Arrival Time: 10:42 ?Had a fall or experienced change in No ?Accompanied By: self ?activities of daily living that may affect ?Transfer Assistance: None ?risk of falls: ?Patient Identification Verified: Yes ?Hospitalized since last visit: No ?Secondary Verification Process Completed: Yes ?Has Dressing in Place as Prescribed: Yes ?Patient Has Alerts: Yes ?Pain Present Now: No ?Patient Alerts: PT HAS SERVICE ANIMAL ?ABI 07/11/21 ?R) 1.16 L) 1.27 ?Electronic Signature(s) ?Signed: 03/22/2022 4:33:41 PM By: Levora Dredge ?Entered By: Levora Dredge on 03/22/2022 10:43:05 ?SILVER, ACHEY. ([id number]) ?-------------------------------------------------------------------------------- ?Lower Extremity Assessment Details ?Patient Name: Laura Santiago, Laura Santiago. ?Date of Service: 03/22/2022 10:30 AM ?Medical Record Number: [id number] ?Patient Account Number: [id number] ?Date of Birth/Sex: Sep 19, 1946 (76 y.o. F) ?Treating RN: Levora Dredge ?Primary Care Veda Arrellano: [facility], [place] Other Clinician: ?Referring Maisha Bogen: [facility] ?Treating Kathleena Freeman/Extender: Ricard Dillon ?Weeks in Treatment: 28 ?Edema Assessment ?Assessed: [Left: No] [Right: No] ?Edema: [Left: Ye] [Right: s] ?Calf ?Left: Right: ?Point of Measurement: 29 cm From Medial Instep 35.2 cm ?Ankle ?Left: Right: ?Point of Measurement: 10 cm From  Medial Instep 28 cm ?Vascular Assessment ?Pulses: ?Dorsalis Pedis ?Palpable: [Left:Yes] ?Electronic Signature(s) ?Signed: 03/22/2022 4:33:41 PM By: Levora Dredge ?Entered By: Levora Dredge on 03/22/2022 10:58:04 ?LIA, VIGILANTE. ([id number]) ?-------------------------------------------------------------------------------- ?Multi Wound Chart Details ?Patient Name: Laura Santiago, Laura Santiago. ?Date of Service: 03/22/2022 10:30 AM ?Medical Record Number: [id number] ?Patient Account Number: [id number] ?Date of Birth/Sex: Apr 12, 1946 (76 y.o. F) ?Treating RN: Levora Dredge ?Primary Care Deshanae Lindo: [facility], [place] Other Clinician: ?Referring Kabrina Christiano: [facility] ?Treating Merlie Noga/Extender: Ricard Dillon ?Weeks in Treatment: 54 ?Vital Signs ?Height(in): 66 ?Pulse(bpm): 87 ?Weight(lbs): 153 ?Blood Pressure(mmHg): 110/66 ?Body Mass Index(BMI): 24.7 ?Temperature(??F): 97.7 ?Respiratory Rate(breaths/min): 18 ?Photos: [N/A:N/A] ?Wound Location: Left, Medial Lower Leg Left, Lateral Lower Leg N/A ?Wounding Event: Gradually Appeared Gradually Appeared N/A ?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 ?Comorbid History: Hypertension, Osteoarthritis, Hypertension, Osteoarthritis, N/A ?Received Chemotherapy, Received Received Chemotherapy, Received ?Radiation Radiation ?Date Acquired: 04/06/2021 03/12/2022 N/A ?Weeks of Treatment: 37 1 N/A ?Wound Status: Open Open N/A ?Wound Recurrence: No No N/A ?Clustered Wound: Yes No N/A ?Measurements L x W x D (cm) 12.5x10x0.2 2x2x0.1 N/A ?Area (cm?) : 98.175 3.142 N/A ?Volume (cm?) : 19.635 0.314 N/A ?% Reduction in Area: -8.70% 48.00% N/A ?% Reduction in Volume: -8.70% 74.00% N/A ?Classification: Full Thickness Without Exposed Full Thickness Without Exposed N/A ?Support Structures Support Structures ?Exudate Amount: Large Large N/A ?Exudate Type: Serous Serous N/A ?Exudate Color: amber amber N/A ?Wound Margin: Flat and Intact Flat and Intact N/A ?Granulation Amount: Small  (1-33%) Small (1-33%) N/A ?Granulation Quality: Red, Pink Pink, Pale N/A ?Necrotic Amount: Large (67-100%) Large (67-100%) N/A ?Exposed Structures: ?Fat Layer (Subcutaneous Tissue): ?Fat Layer (Subcutaneous Tissue): N/A ?Yes Yes ?Fascia: No ?Fascia: No ?Tendon: No ?Tendon: No ?Muscle: No ?Muscle: No ?Joint: No ?Joint: No ?Bone: No ?Bone: No ?Laura Santiago, Laura Santiago ([id number]) ?Epithelialization: Small (1-33%) None N/A ?Treatment Notes ?Electronic Signature(s) ?Signed: 03/22/2022 4:33:41 PM By: Levora Dredge ?Entered By: Levora Dredge on 03/22/2022 11:21:03 ?Laura Santiago,  Laura Santiago ([id number]) ?-------------------------------------------------------------------------------- ?Multi-Disciplinary Care Plan Details ?Patient Name: Laura Santiago, Laura Santiago. ?Date of Service: 03/22/2022 10:30 AM ?Medical Record Number: [id number] ?Patient Account Number: [id number] ?Date of Birth/Sex: 1946-03-04 (76 y.o. F) ?Treating RN: Levora Dredge ?Primary Care Vibha Ferdig: [facility], [place] Other Clinician: ?Referring Decklan Mau: [facility] ?Treating Kendall Justo/Extender: Ricard Dillon ?Weeks in Treatment: 49 ?Active Inactive ?Soft Tissue Infection ?Nursing Diagnoses: ?Impaired tissue integrity ?Potential for infection: soft tissue ?Goals: ?Patient's soft tissue infection will resolve ?Date Initiated: 03/15/2022 ?Target Resolution Date: 03/28/2022 ?Goal Status: Active ?Signs and symptoms of infection will be recognized early to allow for prompt treatment ?Date Initiated: 03/15/2022 ?Target Resolution Date: 03/28/2022 ?Goal Status: Active ?Interventions: ?Assess signs and symptoms of infection every visit ?Treatment Activities: ?Culture and sensitivity : 03/15/2022 ?Notes: ?Wound/Skin Impairment ?Nursing Diagnoses: ?Impaired tissue integrity ?Goals: ?Patient/caregiver will verbalize understanding of skin care regimen ?Date Initiated: 07/06/2021 ?Date Inactivated: 07/27/2021 ?Target Resolution Date: 07/06/2021 ?Goal Status: Met ?Ulcer/skin breakdown will  have a volume reduction of 30% by week 4 ?Date Initiated: 07/06/2021 ?Date Inactivated: 10/12/2021 ?Target Resolution Date: 08/06/2021 ?Goal Status: Unmet ?Unmet Reason: cont tx ?Ulcer/skin breakdown will have a volume reduction of 50% by week 8 ?Date Initiated: 07/06/2021 ?Target Resolution Date: 09/06/2021 ?Goal Status: Active ?Ulcer/skin breakdown will have a volume reduction of 80% by week 12 ?Date Initiated: 07/06/2021 ?Target Resolution Date: 10/06/2021 ?Goal Status: Active ?Ulcer/skin breakdown will heal within 14 weeks ?Date Initiated: 07/06/2021 ?Target Resolution Date: 11/06/2021 ?Goal Status: Active ?Interventions: ?Assess patient/caregiver ability to obtain necessary supplies ?Assess patient/caregiver ability to perform ulcer/skin care regimen upon admission and as needed ?Assess ulceration(s) every visit ?Treatment Activities: ?Referred to DME Tawanda Schall for dressing supplies : 07/06/2021 ?Skin care regimen initiated : 07/06/2021 ?Notes: ?Laura Santiago, Laura Santiago ([id number]) ?Electronic Signature(s) ?Signed: 03/22/2022 4:33:41 PM By: Levora Dredge ?Entered By: Levora Dredge on 03/22/2022 11:19:35 ?Laura Santiago, Laura Santiago. ([id number]) ?-------------------------------------------------------------------------------- ?Pain Assessment Details ?Patient Name: Laura Santiago, Laura Santiago. ?Date of Service: 03/22/2022 10:30 AM ?Medical Record Number: [id number] ?Patient Account Number: [id number] ?Date of Birth/Sex: [birth year] (76 y.o. F) ?Treating RN: Levora Dredge ?Primary Care Duana Benedict: [facility], [place] Other Clinician: ?Referring Jarad Barth: [facility] ?Treating Ota Ebersole/Extender: Ricard Dillon ?Weeks in Treatment: 51 ?Active Problems ?Location of Pain Severity and Description of Pain ?Patient Has Paino No ?Site Locations ?Rate the pain. ?Current Pain Level: 0 ?Pain Management and Medication ?Current Pain Management: ?Electronic Signature(s) ?Signed: 03/22/2022 4:33:41 PM By: Levora Dredge ?Entered By: Levora Dredge on  03/22/2022 10:45:01 ?Laura Santiago, Laura Santiago. ([id number]) ?-------------------------------------------------------------------------------- ?Wound Assessment Details ?Patient Name: Laura Santiago, Laura Santiago. ?Date

## 2022-03-23 NOTE — Progress Notes (Signed)
Laura, Santiago ([id number]) ?Visit Report for 03/22/2022 ?Debridement Details ?Patient Name: Laura, Santiago. ?Date of Service: 03/22/2022 10:30 AM ?Medical Record Number: [id number] ?Patient Account Number: [id number] ?Date of Birth/Sex: 10-Aug-1946 (76 y.o. F) ?Treating RN: Levora Dredge ?Primary Care Provider: [facility], [place] Other Clinician: ?Referring Provider: [facility] ?Treating Provider/Extender: Ricard Dillon ?Weeks in Treatment: 74 ?Debridement Performed for ?Wound #1 Left,Medial Lower Leg ?Assessment: ?Performed By: Physician Ricard Dillon, MD ?Debridement Type: Debridement ?Severity of Tissue Pre Debridement: Fat layer exposed ?Level of Consciousness (Pre- ?Awake and Alert ?procedure): ?Pre-procedure Verification/Time Out ?Yes - 11:22 ?Taken: ?Total Area Debrided (L x W): 11 (cm) x 8 (cm) = 88 (cm?) ?Tissue and other material ?Non-Viable, [place], [employer] ?debrided: ?Level: Non-Viable Tissue ?Debridement Description: Selective/Open Wound ?Instrument: ?Other : scoop scraper ?Bleeding: Minimum ?Hemostasis Achieved: Pressure ?Response to Treatment: Procedure was tolerated well ?Level of Consciousness (Post- ?Awake and Alert ?procedure): ?Post Debridement Measurements of Total Wound ?Length: (cm) 12.5 ?Width: (cm) 10 ?Depth: (cm) 0.2 ?Volume: (cm?) 19.635 ?Character of Wound/Ulcer Post Debridement: Stable ?Severity of Tissue Post Debridement: Fat layer exposed ?Post Procedure Diagnosis ?Same as Pre-procedure ?Electronic Signature(s) ?Signed: 03/22/2022 4:33:41 PM By: Levora Dredge ?Signed: 03/23/2022 7:47:50 AM By: Linton Ham MD ?Entered By: Linton Ham on 03/22/2022 11:36:03 ?Laura, Santiago. ([id number]) ?-------------------------------------------------------------------------------- ?HPI Details ?Patient Name: Laura, Santiago. ?Date of Service: 03/22/2022 10:30 AM ?Medical Record Number: [id number] ?Patient Account Number: [id number] ?Date of Birth/Sex: 30-Aug-1946 (76 y.o.  F) ?Treating RN: Levora Dredge ?Primary Care Provider: [facility], [place] Other Clinician: ?Referring Provider: [facility] ?Treating Provider/Extender: Ricard Dillon ?Weeks in Treatment: 66 ?History of Present Illness ?HPI Description: Admission 7/27 ?Laura Santiago is a 76 year old female with a past medical history of ADHD, metastatic breast cancer, stage IV chronic kidney disease, ?history of DVT on Xarelto and chronic venous insufficiency that presents to the clinic for a chronic left lower extremity wound. She recently moved ?to [facility] 4 days ago. She was being followed by wound care center in [name]. She reports a 10-year history of wounds to her left ?lower extremity that eventually do heal with debridement and compression therapy. She states that the current wound reopened 4 months ago ?and she is using Vaseline and Coban. She denies signs of infection. ?8/3; patient presents for 1 week follow-up. She reports no issues or complaints today. She states she had vascular studies done in the last week. ?She denies signs of infection. She brought her little service dog with her today. ?8/17; patient presents for follow-up. She has missed her last clinic appointment. She states she took the wrap off and attempted to rewrap her ?leg. She is having difficulty with transportation. She has her service dog with her today. Overall she feels well and reports improvement in wound ?healing. She denies signs of infection. She reports owning an old Velcro wrap compression and has this at her living facility ?9/14; patient presents for follow-up. Patient states that over the past 2 to 3 weeks she developed toe wounds to her right foot. She attributes this ?to tight fitting shoes. She subsequently developed cellulitis in the right leg and has been treated by doxycycline by her oncologist. She reports ?improvement in symptoms however continues to have some redness and swelling to this  leg. ?To the left lower extremity patient has been having her wraps changed with home health twice weekly. She states that the [facility] is not ?helping control the drainage. Other than that she has  no issues or complaints today. She denies signs of infection to the left lower extremity. ?9/21; patient presents for follow-up. She reports seeing infectious disease for her cellulitis. She reports no further management. She has home ?health that changes the wraps twice weekly. She has no issues or complaints today. She denies signs of infection. ?10/5; patient presents for follow-up. She has no issues or complaints today. She denies signs of infection. She states that the right great toe has ?not been dressed by home health. ?10/12; patient presents for follow-up. She has no issues or complaints today. She reports improvement in her wound healing. She has been using ?silver alginate to the right great toe wound. She denies signs of infection. ?10/26; patient presents for follow-up. Home health did not have sorbact so they continued to use Hydrofera Blue under the wrap. She has been ?using silver alginate to the great toe wound however she did not have a dressing in place today. She currently denies signs of infection. ?11/2; patient presents for follow-up. She has been using sorb act under the compression wrap. She reports using silver alginate to the toe wound ?again she does not have a dressing in place. She currently denies signs of infection. ?11/23; patient presents for follow-up. Unfortunately she has missed her last 2 clinic appointments. She was last seen 3 weeks ago. She did her ?own compression wrap with Kerlix and Coban yesterday after seeing vein and vascular. She has not been dressing her right great toe wound. She ?currently denies signs of infection. ?11/30; patient presents for 1 week follow-up. She states she changed her dressing last week prior to home health and use sorb act with Dakin's ?and  Hydrofera Blue. Home health has changed the dressing as well and they have been using sorbact. Today she reports increased redness to her ?right lower extremity. She has a history of cellulitis to this leg. She has been using silver alginate to the right great toe. Unfortunately she had an ?episode of diarrhea prior to coming in and had feces all over the right leg and to the wrap of her left leg. ?12/7; patient presents for 1 week follow-up. She states that home health did not come out to change the dressing and she took it off yesterday. It ?is unclear if she is dressing the right toe wound. She denies signs of infection. ?12/14; patient presents for 1 week follow-up. She has no issues or complaints today. ?12/21; patient presents for follow-up. She has no issues or complaints today. She denies signs of infection. ?12/28/2021; patient presents for follow-up. She was hospitalized for sepsis secondary to right lower extremity cellulitis On 12/23. She states she is ?currently at a SNF. She states that she was started on doxycycline this morning for her right great toe swelling and redness. She is not sure what ?dressings have been done to her left lower extremity for the past 3 weeks. She says its been mainly gauze with an Ace wrap. ?1/25; patient presents for follow-up. She is still residing in a skilled nursing facility. She reports mild pain to the left lower extremity wound bed. ?She states she is going to see a podiatrist soon. ?2/8; patient presents for follow-up. She has moved back to her residential community from her skilled nursing facility. She has no issues or ?complaints today. She denies signs of systemic infections. ?2/15; patient presents for follow-up. He has no issues or complaints today. She denies systemic signs of infection. ?2/22; patient presents for follow-up. She has no  issues or complaints today. She denies signs of infection. ?Laura, Santiago. ([id number]) ?3/1; patient presents for  follow-up. She states that home health came out the day after she was seen in our clinic and yesterday to do the wrap ?change. She denies signs of infection. She reports excoriated skin on the ankle. ?3/8; patient

## 2022-03-24 ENCOUNTER — Other Ambulatory Visit: Payer: Self-pay | Admitting: *Deleted

## 2022-03-24 NOTE — Telephone Encounter (Signed)
I called [facility] and spoke to pharmacist and was told that they gave 3 25 mcg patches because that is all they have. The total ordered was 5 patches. So they do have 12 mcg so we sent rx for 12 mcg and she take 2 patches every 72 hours and the total patches for 12 mcg 4 count. That will complete the order that dr Janese Banks originally requested. I have sent message to Janese Banks because she was off today for he rto sign rx this weekend ?

## 2022-03-25 ENCOUNTER — Encounter: Payer: Self-pay | Admitting: Oncology

## 2022-03-25 MED ORDER — FENTANYL 12 MCG/HR TD PT72: 2.0000 | MEDICATED_PATCH | TRANSDERMAL | 0 refills | Status: DC

## 2022-03-28 ENCOUNTER — Other Ambulatory Visit: Payer: Medicare Other | Admitting: Student

## 2022-03-28 ENCOUNTER — Other Ambulatory Visit: Payer: Self-pay | Admitting: Oncology

## 2022-03-28 DIAGNOSIS — C50919 Malignant neoplasm of unspecified site of unspecified female breast: Secondary | ICD-10-CM

## 2022-03-28 DIAGNOSIS — M6281 Muscle weakness (generalized): Secondary | ICD-10-CM

## 2022-03-28 DIAGNOSIS — Z515 Encounter for palliative care: Secondary | ICD-10-CM

## 2022-03-28 DIAGNOSIS — R630 Anorexia: Secondary | ICD-10-CM

## 2022-03-28 DIAGNOSIS — R52 Pain, unspecified: Secondary | ICD-10-CM

## 2022-03-28 NOTE — Progress Notes (Signed)
? ? ?Manufacturing engineer ?Community Palliative Care Consult Note ?Telephone: [phone]  ?Fax: [phone]  ? ?Date of encounter: 03/28/22 ?11:26 AM ?PATIENT NAME: Laura Santiago ?[address] ?[place] [address]   ?[id number] (home)  ?DOB: 78-Jul-1947 ?MRN: [id number] ?PRIMARY CARE PROVIDER:    ?[facility],  ?[place] [address] ?[phone] ? ?REFERRING PROVIDER:   ?[facility] ?[place],  [place] [address] ?[phone] ? ?RESPONSIBLE PARTY:    ?Contact Information   ? ? Name Relation Home Work Mobile  ? [name],[name] Daughter   [phone]  ? ?  ? ? ? ?I met face to face with patient in the facility. Palliative Care was asked to follow this patient by consultation request of  Payne to address advance care planning and complex medical decision making. This is the initial visit.  ? ? ?                                 ASSESSMENT AND PLAN / RECOMMENDATIONS:  ? ?Advance Care Planning/Goals of Care: Goals include to maximize quality of life and symptom management. Patient/health care surrogate gave his/her permission to discuss.Our advance care planning conversation included a discussion about:    ?The value and importance of advance care planning  ?Experiences with loved ones who have been seriously ill or have died  ?Exploration of personal, cultural or spiritual beliefs that might influence medical decisions  ?Exploration of goals of care in the event of a sudden injury or illness  ?CODE STATUS: DNR ? ?Education on Palliative Medicine vs. Hospice services. Would like to stay at [facility]; patient states daughter has expressed patient transitioning to a SNF. Continue current treatment plan. Plans to have a caregiver come in to help.  ? ?Symptom Management/Plan: ? ?Breast cancer with metastases to lymph node and bone. Continue Taxol Herceptin and Perjeta every 3 weeks, with weekly Taxol. Follow up with oncology as  scheduled.  ? ?Pain- chronic pain due to bone mets, chronic vascular wound. Continue duragesic patch, currently applying two 12 mcg patches due to national shortage of 25 mcg patches. Spoke with patient's pharmacy and they will deliver additional patches to patient tomorrow.  ? ?Generalized weakness-continue use of walker for ambulation. She declines PT referral at this time. Monitor for falls/safety. ? ?Decline in appetite-endorses fair appetite. Continue to eat foods she enjoys; encourage restarting nutritional supplement.  ? ?Patient does express needing assistance with medications; will refer to palliative SW.  ? ?Follow up Palliative Care Visit: Palliative care will continue to follow for complex medical decision making, advance care planning, and clarification of goals. Return in 6-8 weeks or prn. ? ?This visit was coded based on medical decision making (MDM). ? ?PPS: 60% ? ?HOSPICE ELIGIBILITY/DIAGNOSIS: TBD ? ?Chief Complaint: Palliative Medicine initial consult.  ? ?HISTORY OF PRESENT ILLNESS:  Laura Santiago is a 76 y.o. 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 Hx of DVT, CKD 3, ADHD, peripheral neuropathy secondary to chemotherapy.  ? ?Patient resides at [facility] apartment since last June. Goes to wound center once a week for chronic vascular wound to left ankle; states she has had wound  x 10 years. Also has home health nurse twice weekly with [facility]. Has a support dog. Endorses pain; currently using Duragesic and  oxycodone PRN pain. Denies nausea, appetite is fair; eats snacks/foods she enjoys. No falls. Receiving treatment/infusion every 3 weeks. Patient is a retired Therapist, sports; she moved to [place] to be near her daughter. A 10-point ROS is negative, except for the pertinent positive and negatives detailed per the HPI. ? ?History obtained from review of EMR, discussion with primary team, and interview with family, facility  staff/caregiver and/or [name].  ?I reviewed available labs, medications, imaging, studies and related documents from the EMR.  Records reviewed and summarized above.  ? ? ?Physical Exam: ?Constitutional: NAD ?General: frail appearing, thin ?EYES: anicteric sclera, lids intact, no discharge  ?ENMT: intact hearing, oral mucous membranes moist, dentition intact ?CV: S1S2, RRR, 1+ LE edema ?Pulmonary: LCTA, no increased work of breathing, no cough, room air ?Abdomen: normo-active BS + 4 quadrants, soft and non tender, no ascites ?GU: deferred ?MSK: moves all extremities, ambulatory with walker ?Skin: warm and dry, no rashes; dressing CDI to left lower extremity ?Neuro: +generalized weakness,  no cognitive impairment ?Psych: non-anxious affect, A and O x 3 ?Hem/lymph/immuno: no widespread bruising ?CURRENT PROBLEM LIST:  ?Patient Active Problem List  ? Diagnosis Date Noted  ? ADHD (attention deficit hyperactivity disorder) 03/09/2022  ? Major depressive disorder, recurrent episode, mild ([place]) 03/09/2022  ? Palliative care encounter   ? Stage 3b chronic kidney disease ([place]) 03/08/2022  ? Venous stasis ulcer of left lower leg with edema of left lower leg ([place]) 03/08/2022  ? Altered mental status, unspecified 03/08/2022  ? Altered mental status 03/08/2022  ? GAD (generalized anxiety disorder) 02/22/2022  ? Peripheral neuropathy due to chemotherapy ([place]) 02/22/2022  ? Personal history of DVT (deep vein thrombosis) 02/22/2022  ? SIRS (systemic inflammatory response syndrome) ([place]) 12/02/2021  ? Venous stasis ulcer with fat layer exposed ([place]) 08/29/2021  ? Tinea pedis of both feet 08/29/2021  ? Cellulitis of right lower extremity 08/29/2021  ? Primary malignant neoplasm of breast with metastasis ([place]) 05/26/2021  ? Back pain 05/17/2021  ? 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 Malignant neoplasm metastatic to bone ([place]) 04/27/2021  ? Primary insomnia 03/28/2021  ? Unsteady gait 03/28/2021  ? Lumbar compression fracture, open,  initial encounter ([place]) 03/18/2021  ? Posterior vitreous detachment of both eyes 02/08/2021  ? Anemia in stage 3b chronic kidney disease ([place]) 09/03/2020  ? AKI (acute kidney injury) ([place]) 06/26/2020  ? Bacteriuria 11/26/2019  ? Iron deficiency anemia 11/26/2019  ? Venous stasis ulcer of left calf limited to breakdown of skin with varicose veins ([place]) 10/22/2018  ? Myopia of both eyes with astigmatism and presbyopia 05/27/2018  ? 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 Hyperparathyroidism, secondary ([place]) 04/25/2018  ? Lactic acidosis 01/27/2018  ? Generalized muscle weakness 03/31/2016  ? Attention deficit hyperactivity disorder (ADHD), predominantly inattentive type 03/29/2016  ? Osteopenia 03/29/2016  ? Essential hypertension 02/20/2016  ? Metabolic syndrome 33/82/5053  ? Chronic anticoagulation 11/16/2015  ? Right leg swelling 11/16/2015  ? Memory loss 02/05/2015  ? Atrophy of muscle of multiple sites 09/30/2014  ? Physical deconditioning 09/30/2014  ? Primary osteoarthritis of right knee 09/30/2014  ? History of breast cancer in female 09/14/2014  ? History of radiation therapy 09/14/2014  ? Acute embolism and thrombosis of unspecified deep veins of lower extremity, bilateral ([place]) 03/30/2014  ? Lymphadenopathy, inguinal 08/28/2013  ? Venous ulcer of ankle, left ([place]) 08/04/2013  ? GERD (gastroesophageal reflux disease) 06/02/2013  ? Personal history of  other venous thrombosis and embolism 12/12/1999  ? ?PAST MEDICAL HISTORY:  ?Active Ambulatory Problems  ?  Diagnosis Date Noted  ? Primary malignant neoplasm of breast with metastasis ([place]) 05/26/2021  ? Venous stasis ulcer with fat layer exposed ([place]) 08/29/2021  ? Tinea pedis of both feet 08/29/2021  ? Cellulitis of right lower extremity 08/29/2021  ? Lactic acidosis 01/27/2018  ? Acute embolism and thrombosis of unspecified deep veins of lower extremity, bilateral ([place]) 03/30/2014  ?  AKI (acute kidney injury) ([place]) 06/26/2020  ? Anemia in stage 3b chronic kidney disease ([place]) 09/03/2020  ? Atrophy of muscle of multiple sites 09/30/2014  ? Attention deficit hyperactivity disorder (ADHD), p

## 2022-03-29 ENCOUNTER — Encounter: Payer: Self-pay | Admitting: Oncology

## 2022-03-29 ENCOUNTER — Encounter (HOSPITAL_BASED_OUTPATIENT_CLINIC_OR_DEPARTMENT_OTHER): Payer: Medicare Other | Admitting: Internal Medicine

## 2022-03-29 DIAGNOSIS — I87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: Secondary | ICD-10-CM | POA: Diagnosis not present

## 2022-03-29 DIAGNOSIS — L97822 Non-pressure chronic ulcer of other part of left lower leg with fat layer exposed: Secondary | ICD-10-CM

## 2022-03-29 NOTE — Progress Notes (Signed)
ZENITH, KERCHEVAL ([id number]) ?Visit Report for 03/29/2022 ?Chief Complaint Document Details ?Patient Name: Laura Santiago, Laura Santiago. ?Date of Service: 03/29/2022 11:30 AM ?Medical Record Number: [id number] ?Patient Account Number: [id number] ?Date of Birth/Sex: 01-25-1946 (76 y.o. F) ?Treating RN: Donnamarie Poag ?Primary Care Provider: [facility], [place] Other Clinician: ?Referring Provider: [facility] ?Treating Provider/Extender: Kalman Shan ?Weeks in Treatment: 38 ?Information Obtained from: Patient ?Chief Complaint ?Left lower extremity wound ?Right toe wounds ?Left upper lateral thigh wounds ?Electronic Signature(s) ?Signed: 03/29/2022 1:45:02 PM By: Kalman Shan DO ?Entered By: Kalman Shan on 03/29/2022 12:33:40 ?Laura Santiago, Laura Santiago. ([id number]) ?-------------------------------------------------------------------------------- ?Debridement Details ?Patient Name: Laura Santiago, Laura Santiago. ?Date of Service: 03/29/2022 11:30 AM ?Medical Record Number: [id number] ?Patient Account Number: [id number] ?Date of Birth/Sex: [birth year] (76 y.o. F) ?Treating RN: Donnamarie Poag ?Primary Care Provider: [facility], [place] Other Clinician: ?Referring Provider: [facility] ?Treating Provider/Extender: Kalman Shan ?Weeks in Treatment: 38 ?Debridement Performed for ?Wound #1 Left,Medial Lower Leg ?Assessment: ?Performed By: Physician Kalman Shan, MD ?Debridement Type: Debridement ?Severity of Tissue Pre Debridement: Fat layer exposed ?Level of Consciousness (Pre- ?Awake and Alert ?procedure): ?Pre-procedure Verification/Time Out ?Yes - 11:55 ?Taken: ?Start Time: 11:56 ?Pain Control: Lidocaine ?Total Area Debrided (L x W): 13.5 (cm) x 9.8 (cm) = 132.3 (cm?) ?Tissue and other material ?Viable, Non-Viable, Slough, Subcutaneous, [place] ?debrided: ?Level: Skin/Subcutaneous Tissue ?Debridement Description: Excisional ?Instrument: Curette ?Bleeding: Minimum ?Hemostasis Achieved: Pressure ?Response to Treatment: Procedure  was tolerated well ?Level of Consciousness (Post- ?Awake and Alert ?procedure): ?Post Debridement Measurements of Total Wound ?Length: (cm) 13.5 ?Width: (cm) 9.8 ?Depth: (cm) 0.2 ?Volume: (cm?) 20.782 ?Character of Wound/Ulcer Post Debridement: Improved ?Severity of Tissue Post Debridement: Fat layer exposed ?Post Procedure Diagnosis ?Same as Pre-procedure ?Electronic Signature(s) ?Signed: 03/29/2022 1:45:02 PM By: Kalman Shan DO ?Signed: 03/29/2022 3:02:04 PM By: Donnamarie Poag ?Entered ByDonnamarie Poag on 03/29/2022 12:00:57 ?Laura Santiago, Laura Santiago. ([id number]) ?-------------------------------------------------------------------------------- ?Debridement Details ?Patient Name: Laura Santiago, Laura Santiago. ?Date of Service: 03/29/2022 11:30 AM ?Medical Record Number: [id number] ?Patient Account Number: [id number] ?Date of Birth/Sex: [birth year] (76 y.o. F) ?Treating RN: Donnamarie Poag ?Primary Care Provider: [facility], [place] Other Clinician: ?Referring Provider: [facility] ?Treating Provider/Extender: Kalman Shan ?Weeks in Treatment: 38 ?Debridement Performed for ?Wound #6 Left,Lateral Lower Leg ?Assessment: ?Performed By: Physician Kalman Shan, MD ?Debridement Type: Debridement ?Severity of Tissue Pre Debridement: Fat layer exposed ?Level of Consciousness (Pre- ?Awake and Alert ?procedure): ?Pre-procedure Verification/Time Out ?Yes - 11:55 ?Taken: ?Start Time: 11:59 ?Pain Control: Lidocaine ?Total Area Debrided (L x W): 1 (cm) x 1 (cm) = 1 (cm?) ?Tissue and other material ?Viable, Non-Viable, Slough, Subcutaneous, [place] ?debrided: ?Level: Skin/Subcutaneous Tissue ?Debridement Description: Excisional ?Instrument: Curette ?Bleeding: Minimum ?Hemostasis Achieved: Pressure ?Response to Treatment: Procedure was tolerated well ?Level of Consciousness (Post- ?Awake and Alert ?procedure): ?Post Debridement Measurements of Total Wound ?Length: (cm) 2 ?Width: (cm) 2 ?Depth: (cm) 0.1 ?Volume: (cm?) 0.314 ?Character of  Wound/Ulcer Post Debridement: Improved ?Severity of Tissue Post Debridement: Fat layer exposed ?Post Procedure Diagnosis ?Same as Pre-procedure ?Electronic Signature(s) ?Signed: 03/29/2022 1:45:02 PM By: Kalman Shan DO ?Signed: 03/29/2022 3:02:04 PM By: Donnamarie Poag ?Entered ByDonnamarie Poag on 03/29/2022 12:01:32 ?Laura Santiago, Laura Santiago. ([id number]) ?-------------------------------------------------------------------------------- ?HPI Details ?Patient Name: Laura Santiago, Laura Santiago. ?Date of Service: 03/29/2022 11:30 AM ?Medical Record Number: [id number] ?Patient Account Number: [id number] ?Date of Birth/Sex: 26-Jul-1946 (76 y.o. F) ?Treating RN: Donnamarie Poag ?Primary Care Provider: [facility], [place] Other Clinician: ?Referring Provider: [facility] ?Treating Provider/Extender: Kalman Shan ?Weeks in Treatment: 38 ?History of Present Illness ?  HPI Description: Admission 7/27 ?Ms. Ilissa Rosner is a 76 year old female with a past medical history of ADHD, metastatic breast cancer, stage IV chronic kidney disease, ?history of DVT on Xarelto and chronic venous insufficiency that presents to the clinic for a chronic left lower extremity wound. She recently moved ?to [facility] 4 days ago. She was being followed by wound care center in [name]. She reports a 10-year history of wounds to her left ?lower extremity that eventually do heal with debridement and compression therapy. She states that the current wound reopened 4 months ago ?and she is using Vaseline and Coban. She denies signs of infection. ?8/3; patient presents for 1 week follow-up. She reports no issues or complaints today. She states she had vascular studies done in the last week. ?She denies signs of infection. She brought her little service dog with her today. ?8/17; patient presents for follow-up. She has missed her last clinic appointment. She states she took the wrap off and attempted to rewrap her ?leg. She is having difficulty with  transportation. She has her service dog with her today. Overall she feels well and reports improvement in wound ?healing. She denies signs of infection. She reports owning an old Velcro wrap compression and has this at her living facility ?9/14; patient presents for follow-up. Patient states that over the past 2 to 3 weeks she developed toe wounds to her right foot. She attributes this ?to tight fitting shoes. She subsequently developed cellulitis in the right leg and has been treated by doxycycline by her oncologist. She reports ?improvement in symptoms however continues to have some redness and swelling to this leg. ?To the left lower extremity patient has been having her wraps changed with home health twice weekly. She states that the [facility] is not ?helping control the drainage. Other than that she has no issues or complaints today. She denies signs of infection to the left lower extremity. ?9/21; patient presents for follow-up. She reports seeing infectious disease for her cellulitis. She reports no further management. She has home ?health that changes the wraps twice weekly. She has no issues or complaints today. She denies signs of infection. ?10/5; patient presents for follow-up. She has no issues or complaints today. She denies signs of infection. She states that the right great toe has ?not been dressed by home health. ?10/12; patient presents for follow-up. She has no issues or complaints today. She reports improvement in her wound healing. She has been using ?silver alginate to the right great toe wound. She denies signs of infection. ?10/26; patient presents for follow-up. Home health did not have sorbact so they continued to use Hydrofera Blue under the wrap. She has been ?using silver alginate to the great toe wound however she did not have a dressing in place today. She currently denies signs of infection. ?11/2; patient presents for follow-up. She has been using sorb act under the compression  wrap. She reports using silver alginate to the toe wound ?again she does not have a dressing in place. She currently denies signs of infection. ?11/23; patient presents for follow-up. Unfortunately she has misse

## 2022-03-29 NOTE — Progress Notes (Signed)
Laura Santiago ([id number]) ?Visit Report for 03/29/2022 ?Arrival Information Details ?Patient Name: Laura Santiago, Laura Santiago. ?Date of Service: 03/29/2022 11:30 AM ?Medical Record Number: [id number] ?Patient Account Number: [id number] ?Date of Birth/Sex: 1946/04/29 (76 y.o. F) ?Treating RN: Donnamarie Poag ?Primary Care Sharilyn Geisinger: [facility], [place] Other Clinician: ?Referring Farron Lafond: [facility] ?Treating Bana Borgmeyer/Extender: Kalman Shan ?Weeks in Treatment: 38 ?Visit Information History Since Last Visit ?Added or deleted any medications: No ?Patient Arrived: [name] ?Had a fall or experienced change in No ?Arrival Time: 11:29 ?activities of daily living that may affect ?Accompanied By: self ?risk of falls: ?Transfer Assistance: None ?Hospitalized since last visit: No ?Patient Identification Verified: Yes ?Has Dressing in Place as Prescribed: Yes ?Secondary Verification Process Completed: Yes ?Pain Present Now: Yes ?Patient Requires Transmission-Based No ?Precautions: ?Patient Has Alerts: Yes ?Patient Alerts: PT HAS SERVICE ?ANIMAL ?ABI 07/11/21 ?R) 1.16 L) 1.27 ?Electronic Signature(s) ?Signed: 03/29/2022 3:02:04 PM By: Donnamarie Poag ?Entered ByDonnamarie Poag on 03/29/2022 11:34:02 ?WAVA, KILDOW. ([id number]) ?-------------------------------------------------------------------------------- ?Encounter Discharge Information Details ?Patient Name: Laura Santiago. ?Date of Service: 03/29/2022 11:30 AM ?Medical Record Number: [id number] ?Patient Account Number: [id number] ?Date of Birth/Sex: Sep 26, 1946 (76 y.o. F) ?Treating RN: Donnamarie Poag ?Primary Care Nikki Glanzer: [facility], [place] Other Clinician: ?Referring Kyri Dai: [facility] ?Treating Alekxander Isola/Extender: Kalman Shan ?Weeks in Treatment: 38 ?Encounter Discharge Information Items Post Procedure Vitals ?Discharge Condition: Stable ?Temperature (?F): 98.2 ?Ambulatory Status: [name] ?Pulse (bpm): 84 ?Discharge Destination: Home ?Respiratory Rate  (breaths/min): 16 ?Transportation: Other ?Blood Pressure (mmHg): 133/76 ?Accompanied By: self ?Schedule Follow-up Appointment: Yes ?Clinical Summary of Care: ?Electronic Signature(s) ?Signed: 03/29/2022 3:02:04 PM By: Donnamarie Poag ?Entered ByDonnamarie Poag on 03/29/2022 12:20:21 ?LEIYAH, MAULTSBY. ([id number]) ?-------------------------------------------------------------------------------- ?Lower Extremity Assessment Details ?Patient Name: Laura Santiago. ?Date of Service: 03/29/2022 11:30 AM ?Medical Record Number: [id number] ?Patient Account Number: [id number] ?Date of Birth/Sex: 05-17-1946 (76 y.o. F) ?Treating RN: Donnamarie Poag ?Primary Care Yan Okray: [facility], [place] Other Clinician: ?Referring Carie Kapuscinski: [facility] ?Treating Guillermo Difrancesco/Extender: Kalman Shan ?Weeks in Treatment: 38 ?Edema Assessment ?Assessed: [Left: Yes] [Right: No] ?Edema: [Left: Ye] [Right: s] ?Calf ?Left: Right: ?Point of Measurement: 29 cm From Medial Instep 37 cm ?Ankle ?Left: Right: ?Point of Measurement: 10 cm From Medial Instep 23 cm ?Electronic Signature(s) ?Signed: 03/29/2022 3:02:04 PM By: Donnamarie Poag ?Entered ByDonnamarie Poag on 03/29/2022 11:42:16 ?OAKLEY, ORBAN. ([id number]) ?-------------------------------------------------------------------------------- ?Multi Wound Chart Details ?Patient Name: Laura Santiago. ?Date of Service: 03/29/2022 11:30 AM ?Medical Record Number: [id number] ?Patient Account Number: [id number] ?Date of Birth/Sex: 1946-11-03 (76 y.o. F) ?Treating RN: Donnamarie Poag ?Primary Care Konstantinos Cordoba: [facility], [place] Other Clinician: ?Referring Biran Mayberry: [facility] ?Treating Terika Pillard/Extender: Kalman Shan ?Weeks in Treatment: 38 ?Vital Signs ?Height(in): 66 ?Pulse(bpm): 84 ?Weight(lbs): 153 ?Blood Pressure(mmHg): 133/76 ?Body Mass Index(BMI): 24.7 ?Temperature(??F): 98.2 ?Respiratory Rate(breaths/min): 16 ?Photos: [N/A:N/A] ?Wound Location: Left, Medial Lower Leg Left, Lateral Lower Leg  N/A ?Wounding Event: Gradually Appeared Gradually Appeared N/A ?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 ?Comorbid History: Hypertension, Osteoarthritis, Hypertension, Osteoarthritis, N/A ?Received Chemotherapy, Received Received Chemotherapy, Received ?Radiation Radiation ?Date Acquired: 04/06/2021 03/12/2022 N/A ?Weeks of Treatment: 38 2 N/A ?Wound Status: Open Open N/A ?Wound Recurrence: No No N/A ?Clustered Wound: Yes No N/A ?Measurements L x W x D (cm) 13.5x9.8x0.2 2x2x0.1 N/A ?Area (cm?) : 103.908 3.142 N/A ?Volume (cm?) : 20.782 0.314 N/A ?% Reduction in Area: -15.00% 48.00% N/A ?% Reduction in Volume: -15.00% 74.00% N/A ?Classification: Full Thickness Without Exposed Full Thickness Without Exposed N/A ?Support Structures Support  Structures ?Exudate Amount: Large Large N/A ?Exudate Type: Serous Serous N/A ?Exudate Color: amber amber N/A ?Wound Margin: Flat and Intact Flat and Intact N/A ?Granulation Amount: Small (1-33%) Small (1-33%) N/A ?Granulation Quality: Red, Pink Pink, Pale N/A ?Necrotic Amount: Large (67-100%) Large (67-100%) N/A ?Exposed Structures: ?Fat Layer (Subcutaneous Tissue): ?Fat Layer (Subcutaneous Tissue): N/A ?Yes Yes ?Fascia: No ?Fascia: No ?Tendon: No ?Tendon: No ?Muscle: No ?Muscle: No ?Joint: No ?Joint: No ?Bone: No ?Bone: No ?[name], [name] ([id number]) ?Epithelialization: Small (1-33%) None N/A ?Treatment Notes ?Electronic Signature(s) ?Signed: 03/29/2022 3:02:04 PM By: Donnamarie Poag ?Entered ByDonnamarie Poag on 03/29/2022 11:44:13 ?IYONNA, RISH. ([id number]) ?-------------------------------------------------------------------------------- ?Multi-Disciplinary Care Plan Details ?Patient Name: Laura Santiago. ?Date of Service: 03/29/2022 11:30 AM ?Medical Record Number: [id number] ?Patient Account Number: [id number] ?Date of Birth/Sex: January 17, 1946 (76 y.o. F) ?Treating RN: Donnamarie Poag ?Primary Care Bettie Capistran: [facility], [place] Other Clinician: ?Referring Scherrie Seneca:  [facility] ?Treating Arif Amendola/Extender: Kalman Shan ?Weeks in Treatment: 38 ?Active Inactive ?Soft Tissue Infection ?Nursing Diagnoses: ?Impaired tissue integrity ?Potential for infection: soft tissue ?Goals: ?Patient's soft tissue infection will resolve ?Date Initiated: 03/15/2022 ?Target Resolution Date: 03/28/2022 ?Goal Status: Active ?Signs and symptoms of infection will be recognized early to allow for prompt treatment ?Date Initiated: 03/15/2022 ?Target Resolution Date: 03/28/2022 ?Goal Status: Active ?Interventions: ?Assess signs and symptoms of infection every visit ?Treatment Activities: ?Culture and sensitivity : 03/15/2022 ?Notes: ?Wound/Skin Impairment ?Nursing Diagnoses: ?Impaired tissue integrity ?Goals: ?Patient/caregiver will verbalize understanding of skin care regimen ?Date Initiated: 07/06/2021 ?Date Inactivated: 07/27/2021 ?Target Resolution Date: 07/06/2021 ?Goal Status: Met ?Ulcer/skin breakdown will have a volume reduction of 30% by week 4 ?Date Initiated: 07/06/2021 ?Date Inactivated: 10/12/2021 ?Target Resolution Date: 08/06/2021 ?Goal Status: Unmet ?Unmet Reason: cont tx ?Ulcer/skin breakdown will have a volume reduction of 50% by week 8 ?Date Initiated: 07/06/2021 ?Target Resolution Date: 09/06/2021 ?Goal Status: Active ?Ulcer/skin breakdown will have a volume reduction of 80% by week 12 ?Date Initiated: 07/06/2021 ?Target Resolution Date: 10/06/2021 ?Goal Status: Active ?Ulcer/skin breakdown will heal within 14 weeks ?Date Initiated: 07/06/2021 ?Target Resolution Date: 11/06/2021 ?Goal Status: Active ?Interventions: ?Assess patient/caregiver ability to obtain necessary supplies ?Assess patient/caregiver ability to perform ulcer/skin care regimen upon admission and as needed ?Assess ulceration(s) every visit ?Treatment Activities: ?Referred to DME Taras Rask for dressing supplies : 07/06/2021 ?Skin care regimen initiated : 07/06/2021 ?Notes: ?ERANDI, LEMMA ([id number]) ?Electronic  Signature(s) ?Signed: 03/29/2022 3:02:04 PM By: Donnamarie Poag ?Entered ByDonnamarie Poag on 03/29/2022 11:43:55 ?JUNG, INGERSON. ([id number]) ?-------------------------------------------------------------------------------- ?Pain A

## 2022-04-03 NOTE — Progress Notes (Signed)
PC SW outreached patient per [facility] NP - L. Rivers SW referral request to assess needs with medication assistance. ? ?Call unsuccessful. SW unable to LVM.  ? ? ?SW outreached patients daughter, [name], whom shared that she is unsure of what medications patient may need assistance with at this time and will follow up with patient. ? ?Daughter has SW Psychologist, counselling to outreach. ?

## 2022-04-04 ENCOUNTER — Inpatient Hospital Stay: Payer: Medicare Other

## 2022-04-04 VITALS — BP 144/66 | HR 72 | Temp 96.2°F | Resp 18 | Wt 144.7 lb

## 2022-04-04 DIAGNOSIS — Z5111 Encounter for antineoplastic chemotherapy: Secondary | ICD-10-CM | POA: Diagnosis not present

## 2022-04-04 DIAGNOSIS — C50919 Malignant neoplasm of unspecified site of unspecified female breast: Secondary | ICD-10-CM

## 2022-04-04 MED ORDER — SODIUM CHLORIDE 0.9 % IV SOLN
Freq: Once | INTRAVENOUS | Status: AC
  Filled 2022-04-04: qty 250

## 2022-04-04 MED ORDER — TRASTUZUMAB-DKST CHEMO 150 MG IV SOLR
6.0000 mg/kg | Freq: Once | INTRAVENOUS | Status: AC
  Administered 2022-04-04: 420 mg via INTRAVENOUS
  Filled 2022-04-04: qty 20

## 2022-04-04 MED ORDER — HEPARIN SOD (PORK) LOCK FLUSH 100 UNIT/ML IV SOLN
500.0000 [IU] | Freq: Once | INTRAVENOUS | Status: AC | PRN
  Administered 2022-04-04: 500 [IU]
  Filled 2022-04-04: qty 5

## 2022-04-04 MED ORDER — ACETAMINOPHEN 325 MG PO TABS
650.0000 mg | ORAL_TABLET | Freq: Once | ORAL | Status: AC
  Administered 2022-04-04: 650 mg via ORAL
  Filled 2022-04-04: qty 2

## 2022-04-04 MED ORDER — HEPARIN SOD (PORK) LOCK FLUSH 100 UNIT/ML IV SOLN
INTRAVENOUS | Status: AC
  Filled 2022-04-04: qty 5

## 2022-04-04 MED ORDER — DIPHENHYDRAMINE HCL 25 MG PO CAPS
50.0000 mg | ORAL_CAPSULE | Freq: Once | ORAL | Status: AC
  Administered 2022-04-04: 50 mg via ORAL
  Filled 2022-04-04: qty 2

## 2022-04-04 MED ORDER — SODIUM CHLORIDE 0.9 % IV SOLN
420.0000 mg | Freq: Once | INTRAVENOUS | Status: AC
  Administered 2022-04-04: 420 mg via INTRAVENOUS
  Filled 2022-04-04: qty 14

## 2022-04-04 NOTE — Patient Instructions (Signed)
[facility]  Discharge Instructions: °Thank you for choosing [facility] to provide your oncology and hematology care.  ° °If you have a lab appointment with the Cancer Center, please go directly to the Cancer Center and check in at the registration area. °  °Wear comfortable clothing and clothing appropriate for easy access to any Portacath or PICC line.  ° °We strive to give you quality time with your provider. You may need to reschedule your appointment if you arrive late (15 or more minutes).  Arriving late affects you and other patients whose appointments are after yours.  Also, if you miss three or more appointments without notifying the office, you may be dismissed from the clinic at the provider’s discretion.    °  °For prescription refill requests, have your pharmacy contact our office and allow 72 hours for refills to be completed.   ° °Today you received the following chemotherapy and/or immunotherapy agents     °  °To help prevent nausea and vomiting after your treatment, we encourage you to take your nausea medication as directed. ° °BELOW ARE SYMPTOMS THAT SHOULD BE REPORTED IMMEDIATELY: °*FEVER GREATER THAN 100.4 F (38 °C) OR HIGHER °*CHILLS OR SWEATING °*NAUSEA AND VOMITING THAT IS NOT CONTROLLED WITH YOUR NAUSEA MEDICATION °*UNUSUAL SHORTNESS OF BREATH °*UNUSUAL BRUISING OR BLEEDING °*URINARY PROBLEMS (pain or burning when urinating, or frequent urination) °*BOWEL PROBLEMS (unusual diarrhea, constipation, pain near the anus) °TENDERNESS IN MOUTH AND THROAT WITH OR WITHOUT PRESENCE OF ULCERS (sore throat, sores in mouth, or a toothache) °UNUSUAL RASH, SWELLING OR PAIN  °UNUSUAL VAGINAL DISCHARGE OR ITCHING  ° °Items with * indicate a potential emergency and should be followed up as soon as possible or go to the Emergency Department if any problems should occur. ° °Please show the CHEMOTHERAPY ALERT CARD or IMMUNOTHERAPY ALERT CARD at check-in to the  Emergency Department and triage nurse. ° °Should you have questions after your visit or need to cancel or reschedule your appointment, please contact [facility]  Dept: [phone]  and follow the prompts.  Office hours are 8:00 a.m. to 4:30 p.m. Monday - Friday. Please note that voicemails left after 4:00 p.m. may not be returned until the following business day.  We are closed weekends and major holidays. You have access to a nurse at all times for urgent questions. Please call the main number to the clinic Dept: [phone] and follow the prompts. ° ° °For any non-urgent questions, you may also contact your provider using MyChart. We now offer e-Visits for anyone 18 and older to request care online for non-urgent symptoms. For details visit [email or web address]. °  °Also download the MyChart app! Go to the app store, search "MyChart", open the app, select [facility], and log in with your MyChart username and password. ° °Due to Covid, a mask is required upon entering the hospital/clinic. If you do not have a mask, one will be given to you upon arrival. For doctor visits, patients may have 1 support person aged 18 or older with them. For treatment visits, patients cannot have anyone with them due to current Covid guidelines and our immunocompromised population.  ° °

## 2022-04-05 ENCOUNTER — Encounter (HOSPITAL_BASED_OUTPATIENT_CLINIC_OR_DEPARTMENT_OTHER): Payer: Medicare Other | Admitting: Internal Medicine

## 2022-04-05 DIAGNOSIS — I87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: Secondary | ICD-10-CM | POA: Diagnosis not present

## 2022-04-05 DIAGNOSIS — L97822 Non-pressure chronic ulcer of other part of left lower leg with fat layer exposed: Secondary | ICD-10-CM | POA: Diagnosis not present

## 2022-04-05 NOTE — Progress Notes (Signed)
Laura, Santiago ([id number]) ?Visit Report for 04/05/2022 ?Arrival Information Details ?Patient Name: Laura Santiago, Laura Santiago. ?Date of Service: 04/05/2022 10:30 AM ?Medical Record Number: [id number] ?Patient Account Number: [id number] ?Date of Birth/Sex: 03-17-1946 (76 y.o. F) ?Treating RN: Levora Dredge ?Primary Care Elba Schaber: [facility], [place] Other Clinician: ?Referring Zamya Culhane: [facility] ?Treating Zyaira Vejar/Extender: Kalman Shan ?Weeks in Treatment: 39 ?Visit Information History Since Last Visit ?Added or deleted any medications: No ?Patient Arrived: [name] ?Any new allergies or adverse reactions: No ?Arrival Time: 10:30 ?Had a fall or experienced change in No ?Accompanied By: self ?activities of daily living that may affect ?Transfer Assistance: None ?risk of falls: ?Patient Identification Verified: Yes ?Hospitalized since last visit: No ?Secondary Verification Process Completed: Yes ?Has Dressing in Place as Prescribed: Yes ?Patient Requires Transmission-Based No ?Has Compression in Place as Prescribed: Yes ?Precautions: ?Pain Present Now: No ?Patient Has Alerts: Yes ?Patient Alerts: PT HAS SERVICE ?ANIMAL ?ABI 07/11/21 ?R) 1.16 L) 1.27 ?Electronic Signature(s) ?Signed: 04/05/2022 4:30:13 PM By: Levora Dredge ?Entered By: Levora Dredge on 04/05/2022 10:30:56 ?KYMARI, LOLLIS. ([id number]) ?-------------------------------------------------------------------------------- ?Clinic Level of Care Assessment Details ?Patient Name: [name], Santiago. ?Date of Service: 04/05/2022 10:30 AM ?Medical Record Number: [id number] ?Patient Account Number: [id number] ?Date of Birth/Sex: [birth year] (76 y.o. F) ?Treating RN: Levora Dredge ?Primary Care Susan Arana: [facility], [place] Other Clinician: ?Referring Addaleigh Nicholls: [facility] ?Treating Natalija Mavis/Extender: Kalman Shan ?Weeks in Treatment: 39 ?Clinic Level of Care Assessment Items ?TOOL 1 Quantity Score ?'[]'$  - Use when EandM and Procedure is performed  on INITIAL visit 0 ?ASSESSMENTS - Nursing Assessment / Reassessment ?'[]'$  - General Physical Exam (combine w/ comprehensive assessment (listed just below) when performed on new ?0 ?pt. evals) ?'[]'$  - 0 ?Comprehensive Assessment (HX, ROS, Risk Assessments, Wounds Hx, etc.) ?ASSESSMENTS - Wound and Skin Assessment / Reassessment ?'[]'$  - Dermatologic / Skin Assessment (not related to wound area) 0 ?ASSESSMENTS - Ostomy and/or Continence Assessment and Care ?'[]'$  - Incontinence Assessment and Management 0 ?'[]'$  - 0 ?Ostomy Care Assessment and Management (repouching, etc.) ?PROCESS - Coordination of Care ?'[]'$  - Simple Patient / Family Education for ongoing care 0 ?'[]'$  - 0 ?Complex (extensive) Patient / Family Education for ongoing care ?'[]'$  - 0 ?Staff obtains Consents, Records, Test Results / Process Orders ?'[]'$  - 0 ?Staff telephones [place], Nursing Homes / Clarify orders / etc ?'[]'$  - 0 ?Routine Transfer to another Facility (non-emergent condition) ?'[]'$  - 0 ?Routine Hospital Admission (non-emergent condition) ?'[]'$  - 0 ?New Admissions / Biomedical engineer / Ordering NPWT, Apligraf, etc. ?'[]'$  - 0 ?Emergency Hospital Admission (emergent condition) ?PROCESS - Special Needs ?'[]'$  - Pediatric / Minor Patient Management 0 ?'[]'$  - 0 ?Isolation Patient Management ?'[]'$  - 0 ?Hearing / Language / Visual special needs ?'[]'$  - 0 ?Assessment of Community assistance (transportation, D/C planning, etc.) ?'[]'$  - 0 ?Additional assistance / Altered mentation ?'[]'$  - 0 ?Support Surface(s) Assessment (bed, cushion, seat, etc.) ?INTERVENTIONS - Miscellaneous ?'[]'$  - External ear exam 0 ?'[]'$  - 0 ?Patient Transfer (multiple staff / Civil Service fast streamer / Similar devices) ?'[]'$  - 0 ?Simple Staple / Suture removal (25 or less) ?'[]'$  - 0 ?Complex Staple / Suture removal (26 or more) ?'[]'$  - 0 ?Hypo/Hyperglycemic Management (do not check if billed separately) ?'[]'$  - 0 ?Ankle / Brachial Index (ABI) - do not check if billed separately ?Has the patient been seen at the hospital within the last  three years: Yes ?Total Score: 0 ?Level Of Care: ____ ?RENAY, CRAMMER ([id number]) ?Electronic Signature(s) ?Signed: 04/05/2022 4:30:13 PM By:  Levora Dredge ?Entered By: Levora Dredge on 04/05/2022 14:11:10 ?JUMANA, PACCIONE. ([id number]) ?-------------------------------------------------------------------------------- ?Encounter Discharge Information Details ?Patient Name: Laura, Santiago. ?Date of Service: 04/05/2022 10:30 AM ?Medical Record Number: [id number] ?Patient Account Number: [id number] ?Date of Birth/Sex: July 25, 1946 (76 y.o. F) ?Treating RN: Levora Dredge ?Primary Care Estha Few: [facility], [place] Other Clinician: ?Referring Katlyne Nishida: [facility] ?Treating Sharin Altidor/Extender: Kalman Shan ?Weeks in Treatment: 39 ?Encounter Discharge Information Items Post Procedure Vitals ?Discharge Condition: Stable ?Temperature (?F): 97.8 ?Ambulatory Status: [name] ?Pulse (bpm): 80 ?Discharge Destination: Other (Note Required) ?Respiratory Rate (breaths/min): 18 ?Telephoned: No ?Blood Pressure (mmHg): 125/74 ?Orders Sent: Yes ?Transportation: Other ?Accompanied By: self ?Schedule Follow-up Appointment: Yes ?Clinical Summary of Care: Patient Declined ?Electronic Signature(s) ?Signed: 04/05/2022 2:13:08 PM By: Levora Dredge ?Entered By: Levora Dredge on 04/05/2022 14:13:08 ?SERINE, KEA. ([id number]) ?-------------------------------------------------------------------------------- ?Lower Extremity Assessment Details ?Patient Name: Laura, Santiago. ?Date of Service: 04/05/2022 10:30 AM ?Medical Record Number: [id number] ?Patient Account Number: [id number] ?Date of Birth/Sex: 09-Sep-1946 (76 y.o. F) ?Treating RN: Levora Dredge ?Primary Care Tranika Scholler: [facility], [place] Other Clinician: ?Referring Lamees Gable: [facility] ?Treating Haider Hornaday/Extender: Kalman Shan ?Weeks in Treatment: 39 ?Edema Assessment ?Assessed: [Left: No] [Right: No] ?Edema: [Left: Ye] [Right: s] ?Calf ?Left:  Right: ?Point of Measurement: 29 cm From Medial Instep 34.3 cm ?Ankle ?Left: Right: ?Point of Measurement: 10 cm From Medial Instep 25 cm ?Vascular Assessment ?Pulses: ?Dorsalis Pedis ?Palpable: [Left:Yes] ?Electronic Signature(s) ?Signed: 04/05/2022 4:30:13 PM By: Levora Dredge ?Entered By: Levora Dredge on 04/05/2022 10:48:30 ?MEDINA, DEGRAFFENREID. ([id number]) ?-------------------------------------------------------------------------------- ?Multi Wound Chart Details ?Patient Name: KARNE, OZGA. ?Date of Service: 04/05/2022 10:30 AM ?Medical Record Number: [id number] ?Patient Account Number: [id number] ?Date of Birth/Sex: 1946/07/18 (76 y.o. F) ?Treating RN: Levora Dredge ?Primary Care Darell Saputo: [facility], [place] Other Clinician: ?Referring Surya Schroeter: [facility] ?Treating Timarion Agcaoili/Extender: Kalman Shan ?Weeks in Treatment: 39 ?Vital Signs ?Height(in): 66 ?Pulse(bpm): 80 ?Weight(lbs): 153 ?Blood Pressure(mmHg): 125/74 ?Body Mass Index(BMI): 24.7 ?Temperature(??F): 97.8 ?Respiratory Rate(breaths/min): 18 ?Photos: [N/A:N/A] ?Wound Location: Left, Medial Lower Leg Left, Lateral Lower Leg N/A ?Wounding Event: Gradually Appeared Gradually Appeared N/A ?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 ?Comorbid History: Hypertension, Osteoarthritis, Hypertension, Osteoarthritis, N/A ?Received Chemotherapy, Received Received Chemotherapy, Received ?Radiation Radiation ?Date Acquired: 04/06/2021 03/12/2022 N/A ?Weeks of Treatment: 39 3 N/A ?Wound Status: Open Open N/A ?Wound Recurrence: No No N/A ?Clustered Wound: Yes No N/A ?Measurements L x W x D (cm) 11x13.7x0.2 2x0.5x0.1 N/A ?Area (cm?) : 118.36 0.785 N/A ?Volume (cm?) : 23.672 0.079 N/A ?% Reduction in Area: -31.00% 87.00% N/A ?% Reduction in Volume: -31.00% 93.50% N/A ?Classification: Full Thickness Without Exposed Full Thickness Without Exposed N/A ?Support Structures Support Structures ?Exudate Amount: Large Large N/A ?Exudate Type: Serous  Serous N/A ?Exudate Color: amber amber N/A ?Wound Margin: Flat and Intact Flat and Intact N/A ?Granulation Amount: Small (1-33%) Small (1-33%) N/A ?Granulation Quality: Red, Pink Pink, Pale N/A ?Necrotic Amoun

## 2022-04-05 NOTE — Progress Notes (Signed)
Laura Santiago, Laura Santiago ([id number]) ?Visit Report for 04/05/2022 ?Chief Complaint Document Details ?Patient Name: Laura Santiago, Laura Santiago. ?Date of Service: 04/05/2022 10:30 AM ?Medical Record Number: [id number] ?Patient Account Number: [id number] ?Date of Birth/Sex: 01-01-1946 (76 y.o. F) ?Treating RN: Levora Dredge ?Primary Care Provider: [facility], [place] Other Clinician: ?Referring Provider: [facility] ?Treating Provider/Extender: Kalman Shan ?Weeks in Treatment: 39 ?Information Obtained from: Patient ?Chief Complaint ?Left lower extremity wound ?Right toe wounds ?Left upper lateral thigh wounds ?Electronic Signature(s) ?Signed: 04/05/2022 2:24:53 PM By: Kalman Shan DO ?Entered By: Kalman Shan on 04/05/2022 14:18:36 ?Laura Santiago, Laura Santiago. ([id number]) ?-------------------------------------------------------------------------------- ?Debridement Details ?Patient Name: Laura Santiago, Laura Santiago. ?Date of Service: 04/05/2022 10:30 AM ?Medical Record Number: [id number] ?Patient Account Number: [id number] ?Date of Birth/Sex: 1946-11-23 (76 y.o. F) ?Treating RN: Levora Dredge ?Primary Care Provider: [facility], [place] Other Clinician: ?Referring Provider: [facility] ?Treating Provider/Extender: Kalman Shan ?Weeks in Treatment: 39 ?Debridement Performed for ?Wound #1 Left,Medial Lower Leg ?Assessment: ?Performed By: Physician Kalman Shan, MD ?Debridement Type: Debridement ?Severity of Tissue Pre Debridement: Fat layer exposed ?Level of Consciousness (Pre- ?Awake and Alert ?procedure): ?Pre-procedure Verification/Time Out ?Yes - 11:27 ?Taken: ?Total Area Debrided (L x W): 11 (cm) x 13.7 (cm) = 150.7 (cm?) ?Tissue and other material ?Viable, Non-Viable, Sl[place], Subcutaneous, [place] ?debrided: ?Level: Skin/Subcutaneous Tissue ?Debridement Description: Excisional ?Instrument: Curette ?Bleeding: Moderate ?Hemostasis Achieved: Pressure ?Response to Treatment: Procedure was tolerated well ?Level of  Consciousness (Post- ?Awake and Alert ?procedure): ?Post Debridement Measurements of Total Wound ?Length: (cm) 11 ?Width: (cm) 13.7 ?Depth: (cm) 0.2 ?Volume: (cm?) 23.672 ?Character of Wound/Ulcer Post Debridement: Stable ?Severity of Tissue Post Debridement: Fat layer exposed ?Post Procedure Diagnosis ?Same as Pre-procedure ?Electronic Signature(s) ?Signed: 04/05/2022 2:24:53 PM By: Kalman Shan DO ?Signed: 04/05/2022 4:30:13 PM By: Levora Dredge ?Entered By: Levora Dredge on 04/05/2022 11:31:24 ?Laura Santiago, Laura Santiago. ([id number]) ?-------------------------------------------------------------------------------- ?HPI Details ?Patient Name: Laura Santiago, Laura Santiago. ?Date of Service: 04/05/2022 10:30 AM ?Medical Record Number: [id number] ?Patient Account Number: [id number] ?Date of Birth/Sex: 1946/01/09 (76 y.o. F) ?Treating RN: Levora Dredge ?Primary Care Provider: [facility], [place] Other Clinician: ?Referring Provider: [facility] ?Treating Provider/Extender: Kalman Shan ?Weeks in Treatment: 39 ?History of Present Illness ?HPI Description: Admission 7/27 ?Ms. Gabriel Paulding is a 76 year old female with a past medical history of ADHD, metastatic breast cancer, stage IV chronic kidney disease, ?history of DVT on Xarelto and chronic venous insufficiency that presents to the clinic for a chronic left lower extremity wound. She recently moved ?to [facility] 4 days ago. She was being followed by wound care center in [name]. She reports a 10-year history of wounds to her left ?lower extremity that eventually do heal with debridement and compression therapy. She states that the current wound reopened 4 months ago ?and she is using Vaseline and Coban. She denies signs of infection. ?8/3; patient presents for 1 week follow-up. She reports no issues or complaints today. She states she had vascular studies done in the last week. ?She denies signs of infection. She brought her little service dog with her  today. ?8/17; patient presents for follow-up. She has missed her last clinic appointment. She states she took the wrap off and attempted to rewrap her ?leg. She is having difficulty with transportation. She has her service dog with her today. Overall she feels well and reports improvement in wound ?healing. She denies signs of infection. She reports owning an old Velcro wrap compression and has this at her living facility ?9/14; patient presents for follow-up. Patient states  that over the past 2 to 3 weeks she developed toe wounds to her right foot. She attributes this ?to tight fitting shoes. She subsequently developed cellulitis in the right leg and has been treated by doxycycline by her oncologist. She reports ?improvement in symptoms however continues to have some redness and swelling to this leg. ?To the left lower extremity patient has been having her wraps changed with home health twice weekly. She states that the [facility] is not ?helping control the drainage. Other than that she has no issues or complaints today. She denies signs of infection to the left lower extremity. ?9/21; patient presents for follow-up. She reports seeing infectious disease for her cellulitis. She reports no further management. She has home ?health that changes the wraps twice weekly. She has no issues or complaints today. She denies signs of infection. ?10/5; patient presents for follow-up. She has no issues or complaints today. She denies signs of infection. She states that the right great toe has ?not been dressed by home health. ?10/12; patient presents for follow-up. She has no issues or complaints today. She reports improvement in her wound healing. She has been using ?silver alginate to the right great toe wound. She denies signs of infection. ?10/26; patient presents for follow-up. Home health did not have sorbact so they continued to use Hydrofera Blue under the wrap. She has been ?using silver alginate to the great toe  wound however she did not have a dressing in place today. She currently denies signs of infection. ?11/2; patient presents for follow-up. She has been using sorb act under the compression wrap. She reports using silver alginate to the toe wound ?again she does not have a dressing in place. She currently denies signs of infection. ?11/23; patient presents for follow-up. Unfortunately she has missed her last 2 clinic appointments. She was last seen 3 weeks ago. She did her ?own compression wrap with Kerlix and Coban yesterday after seeing vein and vascular. She has not been dressing her right great toe wound. She ?currently denies signs of infection. ?11/30; patient presents for 1 week follow-up. She states she changed her dressing last week prior to home health and use sorb act with Dakin's ?and Hydrofera Blue. Home health has changed the dressing as well and they have been using sorbact. Today she reports increased redness to her ?right lower extremity. She has a history of cellulitis to this leg. She has been using silver alginate to the right great toe. Unfortunately she had an ?episode of diarrhea prior to coming in and had feces all over the right leg and to the wrap of her left leg. ?12/7; patient presents for 1 week follow-up. She states that home health did not come out to change the dressing and she took it off yesterday. It ?is unclear if she is dressing the right toe wound. She denies signs of infection. ?12/14; patient presents for 1 week follow-up. She has no issues or complaints today. ?12/21; patient presents for follow-up. She has no issues or complaints today. She denies signs of infection. ?12/28/2021; patient presents for follow-up. She was hospitalized for sepsis secondary to right lower extremity cellulitis On 12/23. She states she is ?currently at a SNF. She states that she was started on doxycycline this morning for her right great toe swelling and redness. She is not sure what ?dressings have  been done to her left lower extremity for the past 3 weeks. She says its been mainly gauze with an Ace wrap. ?1/25;  patient presents for follow-up. She is still residing in a skilled nursing facility. She re

## 2022-04-06 ENCOUNTER — Other Ambulatory Visit: Payer: Self-pay

## 2022-04-10 ENCOUNTER — Other Ambulatory Visit: Payer: Self-pay | Admitting: *Deleted

## 2022-04-10 MED ORDER — OXYCODONE HCL 10 MG PO TABS: 10.0000 mg | ORAL_TABLET | ORAL | 0 refills | Status: DC | PRN

## 2022-04-10 NOTE — Progress Notes (Signed)
PC SW received VM from patients daughter, [name], stating that per patient the only medications she needed assistance were Fentanyl patches and Xarelto.  ? ?SW mailed patient prescription assistance resources to address on file.  ? ?Letter description:  ?"Enclosed is a patient assistance application for the medication Xarelto. If assistance is still needed for this medication, please read the eligibility and check list guidelines, complete the application and seen directly back to the manufacture (address on the application). In addition there is a [employer] drug discount card enclose to assist with purchasing any other medications." ?

## 2022-04-11 ENCOUNTER — Encounter: Payer: Self-pay | Admitting: Oncology

## 2022-04-12 ENCOUNTER — Encounter: Payer: Medicare Other | Attending: Internal Medicine | Admitting: Internal Medicine

## 2022-04-12 DIAGNOSIS — X58XXXA Exposure to other specified factors, initial encounter: Secondary | ICD-10-CM | POA: Diagnosis not present

## 2022-04-12 DIAGNOSIS — I87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: Secondary | ICD-10-CM | POA: Insufficient documentation

## 2022-04-12 DIAGNOSIS — C79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: Secondary | ICD-10-CM | POA: Insufficient documentation

## 2022-04-12 DIAGNOSIS — N184 Chronic kidney disease, stage 4 (severe): Secondary | ICD-10-CM | POA: Diagnosis not present

## 2022-04-12 DIAGNOSIS — I872 Venous insufficiency (chronic) (peripheral): Secondary | ICD-10-CM | POA: Insufficient documentation

## 2022-04-12 DIAGNOSIS — Z7901 Long term (current) use of anticoagulants: Secondary | ICD-10-CM | POA: Insufficient documentation

## 2022-04-12 DIAGNOSIS — L97822 Non-pressure chronic ulcer of other part of left lower leg with fat layer exposed: Secondary | ICD-10-CM | POA: Diagnosis not present

## 2022-04-12 DIAGNOSIS — F909 Attention-deficit hyperactivity disorder, unspecified type: Secondary | ICD-10-CM | POA: Insufficient documentation

## 2022-04-12 DIAGNOSIS — I129 Hypertensive chronic kidney disease with stage 1 through stage 4 chronic kidney disease, or unspecified chronic kidney disease: Secondary | ICD-10-CM | POA: Insufficient documentation

## 2022-04-12 DIAGNOSIS — Z86718 Personal history of other venous thrombosis and embolism: Secondary | ICD-10-CM | POA: Diagnosis not present

## 2022-04-12 DIAGNOSIS — S91201A Unspecified open wound of right great toe with damage to nail, initial encounter: Secondary | ICD-10-CM | POA: Insufficient documentation

## 2022-04-12 NOTE — Progress Notes (Signed)
Laura Santiago ([id number]) ?Visit Report for 04/12/2022 ?Chief Complaint Document Details ?Patient Name: Laura Santiago, Laura Santiago. ?Date of Service: 04/12/2022 10:30 AM ?Medical Record Number: [id number] ?Patient Account Number: [id number] ?Date of Birth/Sex: 24-Nov-1946 (76 y.o. F) ?Treating RN: Levora Dredge ?Primary Care Provider: [facility], [place] Other Clinician: ?Referring Provider: [facility] ?Treating Provider/Extender: Kalman Shan ?Weeks in Treatment: 40 ?Information Obtained from: Patient ?Chief Complaint ?Left lower extremity wound ?Right toe wounds ?Left upper lateral thigh wounds ?Electronic Signature(s) ?Signed: 04/12/2022 11:39:30 AM By: Kalman Shan DO ?Entered By: Kalman Shan on 04/12/2022 10:54:46 ?DANDRIA, GRIEGO. ([id number]) ?-------------------------------------------------------------------------------- ?Debridement Details ?Patient Name: Laura Santiago. ?Date of Service: 04/12/2022 10:30 AM ?Medical Record Number: [id number] ?Patient Account Number: [id number] ?Date of Birth/Sex: [birth year] (76 y.o. F) ?Treating RN: Levora Dredge ?Primary Care Provider: [facility], [place] Other Clinician: ?Referring Provider: [facility] ?Treating Provider/Extender: Kalman Shan ?Weeks in Treatment: 40 ?Debridement Performed for ?Wound #1 Left,Medial Lower Leg ?Assessment: ?Performed By: Physician Kalman Shan, MD ?Debridement Type: Debridement ?Severity of Tissue Pre Debridement: Fat layer exposed ?Level of Consciousness (Pre- ?Awake and Alert ?procedure): ?Pre-procedure Verification/Time Out ?Yes - 10:44 ?Taken: ?Pain Control: Lidocaine 4% Topical Solution ?Total Area Debrided (L x W): 10.2 (cm) x 13.5 (cm) = 137.7 (cm?) ?Tissue and other material ?Viable, Non-Viable, Slough, Subcutaneous, [place] ?debrided: ?Level: Skin/Subcutaneous Tissue ?Debridement Description: Excisional ?Instrument: Curette ?Bleeding: Moderate ?Hemostasis Achieved: Pressure ?Response to Treatment:  Procedure was tolerated well ?Level of Consciousness (Post- ?Awake and Alert ?procedure): ?Post Debridement Measurements of Total Wound ?Length: (cm) 10.2 ?Width: (cm) 13.5 ?Depth: (cm) 0.2 ?Volume: (cm?) 21.63 ?Character of Wound/Ulcer Post Debridement: Stable ?Severity of Tissue Post Debridement: Fat layer exposed ?Post Procedure Diagnosis ?Same as Pre-procedure ?Notes ?75% slough subcutaneous removed ?Electronic Signature(s) ?Signed: 04/12/2022 11:39:30 AM By: Kalman Shan DO ?Signed: 04/12/2022 4:16:06 PM By: Levora Dredge ?Entered By: Levora Dredge on 04/12/2022 10:48:40 ?DYLIN, IHNEN. ([id number]) ?-------------------------------------------------------------------------------- ?HPI Details ?Patient Name: Laura Santiago. ?Date of Service: 04/12/2022 10:30 AM ?Medical Record Number: [id number] ?Patient Account Number: [id number] ?Date of Birth/Sex: [birth year] (76 y.o. F) ?Treating RN: Levora Dredge ?Primary Care Provider: [facility], [place] Other Clinician: ?Referring Provider: [facility] ?Treating Provider/Extender: Kalman Shan ?Weeks in Treatment: 40 ?History of Present Illness ?HPI Description: Admission 7/27 ?Ms. [name] is a 76 year old female with a past medical history of ADHD, metastatic breast cancer, stage IV chronic kidney disease, ?history of DVT on Xarelto and chronic venous insufficiency that presents to the clinic for a chronic left lower extremity wound. She recently moved ?to [facility] 4 days ago. She was being followed by wound care center in [name]. She reports a 10-year history of wounds to her left ?lower extremity that eventually do heal with debridement and compression therapy. She states that the current wound reopened 4 months ago ?and she is using Vaseline and Coban. She denies signs of infection. ?8/3; patient presents for 1 week follow-up. She reports no issues or complaints today. She states she had vascular studies done in the last  week. ?She denies signs of infection. She brought her little service dog with her today. ?8/17; patient presents for follow-up. She has missed her last clinic appointment. She states she took the wrap off and attempted to rewrap her ?leg. She is having difficulty with transportation. She has her service dog with her today. Overall she feels well and reports improvement in wound ?healing. She denies signs of infection. She reports owning an old Velcro wrap compression and has this  at her living facility ?9/14; patient presents for follow-up. Patient states that over the past 2 to 3 weeks she developed toe wounds to her right foot. She attributes this ?to tight fitting shoes. She subsequently developed cellulitis in the right leg and has been treated by doxycycline by her oncologist. She reports ?improvement in symptoms however continues to have some redness and swelling to this leg. ?To the left lower extremity patient has been having her wraps changed with home health twice weekly. She states that the [facility] is not ?helping control the drainage. Other than that she has no issues or complaints today. She denies signs of infection to the left lower extremity. ?9/21; patient presents for follow-up. She reports seeing infectious disease for her cellulitis. She reports no further management. She has home ?health that changes the wraps twice weekly. She has no issues or complaints today. She denies signs of infection. ?10/5; patient presents for follow-up. She has no issues or complaints today. She denies signs of infection. She states that the right great toe has ?not been dressed by home health. ?10/12; patient presents for follow-up. She has no issues or complaints today. She reports improvement in her wound healing. She has been using ?silver alginate to the right great toe wound. She denies signs of infection. ?10/26; patient presents for follow-up. Home health did not have sorbact so they continued to use  Hydrofera Blue under the wrap. She has been ?using silver alginate to the great toe wound however she did not have a dressing in place today. She currently denies signs of infection. ?11/2; patient presents for follow-up. She has been using sorb act under the compression wrap. She reports using silver alginate to the toe wound ?again she does not have a dressing in place. She currently denies signs of infection. ?11/23; patient presents for follow-up. Unfortunately she has missed her last 2 clinic appointments. She was last seen 3 weeks ago. She did her ?own compression wrap with Kerlix and Coban yesterday after seeing vein and vascular. She has not been dressing her right great toe wound. She ?currently denies signs of infection. ?11/30; patient presents for 1 week follow-up. She states she changed her dressing last week prior to home health and use sorb act with Dakin's ?and Hydrofera Blue. Home health has changed the dressing as well and they have been using sorbact. Today she reports increased redness to her ?right lower extremity. She has a history of cellulitis to this leg. She has been using silver alginate to the right great toe. Unfortunately she had an ?episode of diarrhea prior to coming in and had feces all over the right leg and to the wrap of her left leg. ?12/7; patient presents for 1 week follow-up. She states that home health did not come out to change the dressing and she took it off yesterday. It ?is unclear if she is dressing the right toe wound. She denies signs of infection. ?12/14; patient presents for 1 week follow-up. She has no issues or complaints today. ?12/21; patient presents for follow-up. She has no issues or complaints today. She denies signs of infection. ?12/28/2021; patient presents for follow-up. She was hospitalized for sepsis secondary to right lower extremity cellulitis On 12/23. She states she is ?currently at a SNF. She states that she was started on doxycycline this morning  for her right great toe swelling and redness. She is not sure what ?dressings have been done to her left lower extremity for the past 3 weeks.  She says its been mainly gauze with an Ace wrap. ?1/25; patient

## 2022-04-12 NOTE — Progress Notes (Signed)
Santiago, Laura ([id number]) ?Visit Report for 04/12/2022 ?Arrival Information Details ?Patient Name: Laura Santiago, Laura Santiago. ?Date of Service: 04/12/2022 10:30 AM ?Medical Record Number: [id number] ?Patient Account Number: [id number] ?Date of Birth/Sex: 1946/01/04 (76 y.o. F) ?Treating RN: Levora Dredge ?Primary Care Enyah Moman: [facility], [place] Other Clinician: ?Referring Alaria Oconnor: [facility] ?Treating Jodelle Fausto/Extender: Kalman Shan ?Weeks in Treatment: 40 ?Visit Information History Since Last Visit ?Added or deleted any medications: No ?Patient Arrived: [name] ?Any new allergies or adverse reactions: No ?Arrival Time: 10:20 ?Had a fall or experienced change in No ?Accompanied By: self ?activities of daily living that may affect ?Transfer Assistance: None ?risk of falls: ?Patient Identification Verified: Yes ?Hospitalized since last visit: No ?Secondary Verification Process Completed: Yes ?Has Dressing in Place as Prescribed: Yes ?Patient Requires Transmission-Based No ?Has Compression in Place as Prescribed: Yes ?Precautions: ?Pain Present Now: No ?Patient Has Alerts: Yes ?Patient Alerts: PT HAS SERVICE ?ANIMAL ?ABI 07/11/21 ?R) 1.16 L) 1.27 ?Electronic Signature(s) ?Signed: 04/12/2022 4:16:06 PM By: Levora Dredge ?Entered By: Levora Dredge on 04/12/2022 10:21:46 ?KIERSTEN, COSS. ([id number]) ?-------------------------------------------------------------------------------- ?Clinic Level of Care Assessment Details ?Patient Name: Laura, Santiago. ?Date of Service: 04/12/2022 10:30 AM ?Medical Record Number: [id number] ?Patient Account Number: [id number] ?Date of Birth/Sex: Sep 28, 1946 (76 y.o. F) ?Treating RN: Levora Dredge ?Primary Care Duey Liller: [facility], [place] Other Clinician: ?Referring Christophr Calix: [facility] ?Treating Makyna Niehoff/Extender: Kalman Shan ?Weeks in Treatment: 40 ?Clinic Level of Care Assessment Items ?TOOL 1 Quantity Score ?'[]'$  - Use when EandM and Procedure is performed on  INITIAL visit 0 ?ASSESSMENTS - Nursing Assessment / Reassessment ?'[]'$  - General Physical Exam (combine w/ comprehensive assessment (listed just below) when performed on new ?0 ?pt. evals) ?'[]'$  - 0 ?Comprehensive Assessment (HX, ROS, Risk Assessments, Wounds Hx, etc.) ?ASSESSMENTS - Wound and Skin Assessment / Reassessment ?'[]'$  - Dermatologic / Skin Assessment (not related to wound area) 0 ?ASSESSMENTS - Ostomy and/or Continence Assessment and Care ?'[]'$  - Incontinence Assessment and Management 0 ?'[]'$  - 0 ?Ostomy Care Assessment and Management (repouching, etc.) ?PROCESS - Coordination of Care ?'[]'$  - Simple Patient / Family Education for ongoing care 0 ?'[]'$  - 0 ?Complex (extensive) Patient / Family Education for ongoing care ?'[]'$  - 0 ?Staff obtains Consents, Records, Test Results / Process Orders ?'[]'$  - 0 ?Staff telephones HHA, Nursing Homes / Clarify orders / etc ?'[]'$  - 0 ?Routine Transfer to another Facility (non-emergent condition) ?'[]'$  - 0 ?Routine Hospital Admission (non-emergent condition) ?'[]'$  - 0 ?New Admissions / Biomedical engineer / Ordering NPWT, Apligraf, etc. ?'[]'$  - 0 ?Emergency Hospital Admission (emergent condition) ?PROCESS - Special Needs ?'[]'$  - Pediatric / Minor Patient Management 0 ?'[]'$  - 0 ?Isolation Patient Management ?'[]'$  - 0 ?Hearing / Language / Visual special needs ?'[]'$  - 0 ?Assessment of Community assistance (transportation, D/C planning, etc.) ?'[]'$  - 0 ?Additional assistance / Altered mentation ?'[]'$  - 0 ?Support Surface(s) Assessment (bed, cushion, seat, etc.) ?INTERVENTIONS - Miscellaneous ?'[]'$  - External ear exam 0 ?'[]'$  - 0 ?Patient Transfer (multiple staff / Civil Service fast streamer / Similar devices) ?'[]'$  - 0 ?Simple Staple / Suture removal (25 or less) ?'[]'$  - 0 ?Complex Staple / Suture removal (26 or more) ?'[]'$  - 0 ?Hypo/Hyperglycemic Management (do not check if billed separately) ?'[]'$  - 0 ?Ankle / Brachial Index (ABI) - do not check if billed separately ?Has the patient been seen at the hospital within the last three  years: Yes ?Total Score: 0 ?Level Of Care: ____ ?LURLIE, WIGEN ([id number]) ?Electronic Signature(s) ?Signed: 04/12/2022 4:16:06 PM By:  Levora Dredge ?Entered By: Levora Dredge on 04/12/2022 11:18:43 ?EMILIANNA, BARLOWE. ([id number]) ?-------------------------------------------------------------------------------- ?Encounter Discharge Information Details ?Patient Name: Santiago, Laura. ?Date of Service: 04/12/2022 10:30 AM ?Medical Record Number: [id number] ?Patient Account Number: [id number] ?Date of Birth/Sex: [birth year] (76 y.o. F) ?Treating RN: Levora Dredge ?Primary Care Maleia Weems: [facility], [place] Other Clinician: ?Referring Davontay Watlington: [facility] ?Treating Kathey Simer/Extender: Kalman Shan ?Weeks in Treatment: 40 ?Encounter Discharge Information Items Post Procedure Vitals ?Discharge Condition: Stable ?Temperature (?F): 97.5 ?Ambulatory Status: [name] ?Pulse (bpm): 73 ?Discharge Destination: Home ?Respiratory Rate (breaths/min): 18 ?Transportation: Other ?Blood Pressure (mmHg): 126/73 ?Accompanied By: self ?Schedule Follow-up Appointment: Yes ?Clinical Summary of Care: Patient Declined ?Electronic Signature(s) ?Signed: 04/12/2022 11:20:04 AM By: Levora Dredge ?Entered By: Levora Dredge on 04/12/2022 11:20:04 ?CECILY, LAWHORNE. ([id number]) ?-------------------------------------------------------------------------------- ?Lower Extremity Assessment Details ?Patient Name: Laura, Santiago. ?Date of Service: 04/12/2022 10:30 AM ?Medical Record Number: [id number] ?Patient Account Number: [id number] ?Date of Birth/Sex: [birth year] (76 y.o. F) ?Treating RN: Levora Dredge ?Primary Care Lovelle Lema: [facility], [place] Other Clinician: ?Referring Lashawn Bromwell: [facility] ?Treating Ovie Cornelio/Extender: Kalman Shan ?Weeks in Treatment: 40 ?Edema Assessment ?Assessed: [Left: No] [Right: No] ?Edema: [Left: Ye] [Right: s] ?Calf ?Left: Right: ?Point of Measurement: 29 cm From Medial Instep 36.5  cm ?Ankle ?Left: Right: ?Point of Measurement: 10 cm From Medial Instep 26.4 cm ?Vascular Assessment ?Pulses: ?Dorsalis Pedis ?Palpable: [Left:Yes] ?Electronic Signature(s) ?Signed: 04/12/2022 4:16:06 PM By: Levora Dredge ?Entered By: Levora Dredge on 04/12/2022 10:38:46 ?MADDELINE, ROORDA. ([id number]) ?-------------------------------------------------------------------------------- ?Multi Wound Chart Details ?Patient Name: DARYAN, CAGLEY. ?Date of Service: 04/12/2022 10:30 AM ?Medical Record Number: [id number] ?Patient Account Number: [id number] ?Date of Birth/Sex: 1946/05/16 (76 y.o. F) ?Treating RN: Levora Dredge ?Primary Care Tiasia Weberg: [facility] [place] Other Clinician: ?Referring Mayia Megill: [facility] ?Treating Bradlee Bridgers/Extender: Kalman Shan ?Weeks in Treatment: 40 ?Vital Signs ?Height(in): 66 ?Pulse(bpm): 73 ?Weight(lbs): 153 ?Blood Pressure(mmHg): 126/73 ?Body Mass Index(BMI): 24.7 ?Temperature(??F): 97.5 ?Respiratory Rate(breaths/min): 18 ?Photos: [N/A:N/A] ?Wound Location: Left, Medial Lower Leg Left, Lateral Lower Leg N/A ?Wounding Event: Gradually Appeared Gradually Appeared N/A ?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 ?Comorbid History: Hypertension, Osteoarthritis, Hypertension, Osteoarthritis, N/A ?Received Chemotherapy, Received Received Chemotherapy, Received ?Radiation Radiation ?Date Acquired: 04/06/2021 03/12/2022 N/A ?Weeks of Treatment: 40 4 N/A ?Wound Status: Open Open N/A ?Wound Recurrence: No No N/A ?Clustered Wound: Yes No N/A ?Measurements L x W x D (cm) 10.2x13.5x0.2 0.1x0.1x0.1 N/A ?Area (cm?) : 108.149 0.008 N/A ?Volume (cm?) : 21.63 0.001 N/A ?% Reduction in Area: -19.70% 99.90% N/A ?% Reduction in Volume: -19.70% 99.90% N/A ?Classification: Full Thickness Without Exposed Full Thickness Without Exposed N/A ?Support Structures Support Structures ?Exudate Amount: Large None Present N/A ?Exudate Type: Serosanguineous N/A N/A ?Exudate Color: red, brown N/A  N/A ?Wound Margin: Flat and Intact Flat and Intact N/A ?Granulation Amount: Small (1-33%) None Present (0%) N/A ?Granulation Quality: Red, Pink N/A N/A ?Necrotic Amount: Large (67-100%) None Present (0%) N/A

## 2022-04-16 ENCOUNTER — Other Ambulatory Visit: Payer: Self-pay | Admitting: Oncology

## 2022-04-17 ENCOUNTER — Telehealth: Payer: Self-pay | Admitting: Student

## 2022-04-17 NOTE — Telephone Encounter (Signed)
Palliative NP returned call to patient's daughter. Patient is asking for refill for her fentanyl patch. She is advised that prescriptions had been filled from Dr. Janese Banks or [facility]. She is going to check to see if patient still has patches as they were last filled on 4/18.  ?

## 2022-04-18 ENCOUNTER — Other Ambulatory Visit: Payer: Self-pay | Admitting: Oncology

## 2022-04-18 ENCOUNTER — Other Ambulatory Visit (HOSPITAL_COMMUNITY): Payer: Self-pay

## 2022-04-18 ENCOUNTER — Encounter: Payer: Self-pay | Admitting: Oncology

## 2022-04-18 MED ORDER — FENTANYL 25 MCG/HR TD PT72
1.0000 | MEDICATED_PATCH | TRANSDERMAL | 0 refills | Status: DC
  Filled 2022-04-18 (×2): qty 5, 15d supply, fill #0

## 2022-04-19 ENCOUNTER — Encounter (HOSPITAL_BASED_OUTPATIENT_CLINIC_OR_DEPARTMENT_OTHER): Payer: Medicare Other | Admitting: Internal Medicine

## 2022-04-19 DIAGNOSIS — L97822 Non-pressure chronic ulcer of other part of left lower leg with fat layer exposed: Secondary | ICD-10-CM | POA: Diagnosis not present

## 2022-04-19 DIAGNOSIS — I87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: Secondary | ICD-10-CM | POA: Diagnosis not present

## 2022-04-19 NOTE — Progress Notes (Addendum)
SUZANN, LAZARO ([id number]) ?Visit Report for 04/19/2022 ?Chief Complaint Document Details ?Patient Name: Laura Santiago, Laura Santiago. ?Date of Service: 04/19/2022 12:30 PM ?Medical Record Number: [id number] ?Patient Account Number: [id number] ?Date of Birth/Sex: 1946-03-10 (76 y.o. F) ?Treating RN: Carlene Coria ?Primary Care Provider: [place], [place] Other Clinician: ?Referring Provider: [facility] ?Treating Provider/Extender: Kalman Shan ?Weeks in Treatment: 41 ?Information Obtained from: Patient ?Chief Complaint ?Left lower extremity wound ?Right toe wounds ?Left upper lateral thigh wounds ?Electronic Signature(s) ?Signed: 04/19/2022 2:23:27 PM By: Kalman Shan DO ?Entered By: Kalman Shan on 04/19/2022 13:20:59 ?MARIEANNE, MARXEN. ([id number]) ?-------------------------------------------------------------------------------- ?Debridement Details ?Patient Name: Laura, Santiago. ?Date of Service: 04/19/2022 12:30 PM ?Medical Record Number: [id number] ?Patient Account Number: [id number] ?Date of Birth/Sex: Dec 10, 1946 (76 y.o. F) ?Treating RN: Carlene Coria ?Primary Care Provider: [facility], [place] Other Clinician: ?Referring Provider: [facility] ?Treating Provider/Extender: Kalman Shan ?Weeks in Treatment: 41 ?Debridement Performed for ?Wound #1 Left,Medial Lower Leg ?Assessment: ?Performed By: Physician Kalman Shan, MD ?Debridement Type: Debridement ?Severity of Tissue Pre Debridement: Fat layer exposed ?Level of Consciousness (Pre- ?Awake and Alert ?procedure): ?Pre-procedure Verification/Time Out ?Yes - 13:00 ?Taken: ?Start Time: 13:00 ?Total Area Debrided (L x W): 4 (cm) x 6 (cm) = 24 (cm?) ?Tissue and other material ?Viable, Non-Viable, [place], Subcutaneous, [place] ?debrided: ?Level: Skin/Subcutaneous Tissue ?Debridement Description: Excisional ?Instrument: Curette ?Bleeding: Minimum ?Hemostasis Achieved: Pressure ?End Time: 13:05 ?Procedural Pain: 0 ?Post Procedural Pain:  0 ?Response to Treatment: Procedure was tolerated well ?Level of Consciousness (Post- ?Awake and Alert ?procedure): ?Post Debridement Measurements of Total Wound ?Length: (cm) 10 ?Width: (cm) 19 ?Depth: (cm) 0.2 ?Volume: (cm?) 29.845 ?Character of Wound/Ulcer Post Debridement: Improved ?Severity of Tissue Post Debridement: Fat layer exposed ?Post Procedure Diagnosis ?Same as Pre-procedure ?Electronic Signature(s) ?Signed: 04/19/2022 2:23:27 PM By: Kalman Shan DO ?Signed: 04/19/2022 3:40:11 PM By: Carlene Coria RN ?Entered By: Carlene Coria on 04/19/2022 13:05:33 ?STACI, DACK. ([id number]) ?-------------------------------------------------------------------------------- ?HPI Details ?Patient Name: Laura, Santiago. ?Date of Service: 04/19/2022 12:30 PM ?Medical Record Number: [id number] ?Patient Account Number: [id number] ?Date of Birth/Sex: 1946-01-30 (76 y.o. F) ?Treating RN: Carlene Coria ?Primary Care Provider: [facility], [place] Other Clinician: ?Referring Provider: [facility] ?Treating Provider/Extender: Kalman Shan ?Weeks in Treatment: 41 ?History of Present Illness ?HPI Description: Admission 7/27 ?Laura Santiago is a 76 year old female with a past medical history of ADHD, metastatic breast cancer, stage IV chronic kidney disease, ?history of DVT on Xarelto and chronic venous insufficiency that presents to the clinic for a chronic left lower extremity wound. She recently moved ?to [facility] 4 days ago. She was being followed by wound care center in [name]. She reports a 10-year history of wounds to her left ?lower extremity that eventually do heal with debridement and compression therapy. She states that the current wound reopened 4 months ago ?and she is using Vaseline and Coban. She denies signs of infection. ?8/3; patient presents for 1 week follow-up. She reports no issues or complaints today. She states she had vascular studies done in the last week. ?She denies signs  of infection. She brought her little service dog with her today. ?8/17; patient presents for follow-up. She has missed her last clinic appointment. She states she took the wrap off and attempted to rewrap her ?leg. She is having difficulty with transportation. She has her service dog with her today. Overall she feels well and reports improvement in wound ?healing. She denies signs of infection. She reports owning an old Velcro wrap compression  and has this at her living facility ?9/14; patient presents for follow-up. Patient states that over the past 2 to 3 weeks she developed toe wounds to her right foot. She attributes this ?to tight fitting shoes. She subsequently developed cellulitis in the right leg and has been treated by doxycycline by her oncologist. She reports ?improvement in symptoms however continues to have some redness and swelling to this leg. ?To the left lower extremity patient has been having her wraps changed with home health twice weekly. She states that the [facility] is not ?helping control the drainage. Other than that she has no issues or complaints today. She denies signs of infection to the left lower extremity. ?9/21; patient presents for follow-up. She reports seeing infectious disease for her cellulitis. She reports no further management. She has home ?health that changes the wraps twice weekly. She has no issues or complaints today. She denies signs of infection. ?10/5; patient presents for follow-up. She has no issues or complaints today. She denies signs of infection. She states that the right great toe has ?not been dressed by home health. ?10/12; patient presents for follow-up. She has no issues or complaints today. She reports improvement in her wound healing. She has been using ?silver alginate to the right great toe wound. She denies signs of infection. ?10/26; patient presents for follow-up. Home health did not have sorbact so they continued to use Hydrofera Blue under the  wrap. She has been ?using silver alginate to the great toe wound however she did not have a dressing in place today. She currently denies signs of infection. ?11/2; patient presents for follow-up. She has been using sorb act under the compression wrap. She reports using silver alginate to the toe wound ?again she does not have a dressing in place. She currently denies signs of infection. ?11/23; patient presents for follow-up. Unfortunately she has missed her last 2 clinic appointments. She was last seen 3 weeks ago. She did her ?own compression wrap with Kerlix and Coban yesterday after seeing vein and vascular. She has not been dressing her right great toe wound. She ?currently denies signs of infection. ?11/30; patient presents for 1 week follow-up. She states she changed her dressing last week prior to home health and use sorb act with Dakin's ?and Hydrofera Blue. Home health has changed the dressing as well and they have been using sorbact. Today she reports increased redness to her ?right lower extremity. She has a history of cellulitis to this leg. She has been using silver alginate to the right great toe. Unfortunately she had an ?episode of diarrhea prior to coming in and had feces all over the right leg and to the wrap of her left leg. ?12/7; patient presents for 1 week follow-up. She states that home health did not come out to change the dressing and she took it off yesterday. It ?is unclear if she is dressing the right toe wound. She denies signs of infection. ?12/14; patient presents for 1 week follow-up. She has no issues or complaints today. ?12/21; patient presents for follow-up. She has no issues or complaints today. She denies signs of infection. ?12/28/2021; patient presents for follow-up. She was hospitalized for sepsis secondary to right lower extremity cellulitis On 12/23. She states she is ?currently at a SNF. She states that she was started on doxycycline this morning for her right great toe  swelling and redness. She is not sure what ?dressings have been done to her left lower extremity for the  past 3 weeks. She says its been mainly gauze with an Ace wrap. ?1/25; patient presents for follow-up

## 2022-04-19 NOTE — Progress Notes (Signed)
VENDETTA, PITTINGER ([id number]) ?Visit Report for 04/19/2022 ?Arrival Information Details ?Patient Name: Laura Santiago, Laura Santiago. ?Date of Service: 04/19/2022 12:30 PM ?Medical Record Number: [id number] ?Patient Account Number: [id number] ?Date of Birth/Sex: 12/06/1946 (76 y.o. F) ?Treating RN: Carlene Coria ?Primary Care Cullen Lahaie: [facility], [place] Other Clinician: ?Referring Lilyian Quayle: [facility] ?Treating Dejour Vos/Extender: Kalman Shan ?Weeks in Treatment: 41 ?Visit Information History Since Last Visit ?All ordered tests and consults were completed: No ?Patient Arrived: [name] ?Added or deleted any medications: No ?Arrival Time: 12:39 ?Any new allergies or adverse reactions: No ?Accompanied By: self ?Had a fall or experienced change in No ?Transfer Assistance: None ?activities of daily living that may affect ?Patient Identification Verified: Yes ?risk of falls: ?Secondary Verification Process Completed: Yes ?Signs or symptoms of abuse/neglect since last visito No ?Patient Requires Transmission-Based No ?Hospitalized since last visit: No ?Precautions: ?Implantable device outside of the clinic excluding No ?Patient Has Alerts: Yes ?cellular tissue based products placed in the center ?Patient Alerts: PT HAS SERVICE ?since last visit: ?ANIMAL ?Has Dressing in Place as Prescribed: Yes ?ABI 07/11/21 ?Has Compression in Place as Prescribed: Yes ?R) 1.16 L) 1.27 ?Pain Present Now: No ?Electronic Signature(s) ?Signed: 04/19/2022 3:40:11 PM By: Carlene Coria RN ?Entered By: Carlene Coria on 04/19/2022 12:44:59 ?NADELYN, ENRIQUES. ([id number]) ?-------------------------------------------------------------------------------- ?Clinic Level of Care Assessment Details ?Patient Name: Laura Santiago. ?Date of Service: 04/19/2022 12:30 PM ?Medical Record Number: [id number] ?Patient Account Number: [id number] ?Date of Birth/Sex: [birth year] (76 y.o. F) ?Treating RN: Carlene Coria ?Primary Care Haroldine Redler: [facility], [place] Other  Clinician: ?Referring Tyannah Sane: [facility] ?Treating Darrel Gloss/Extender: Kalman Shan ?Weeks in Treatment: 41 ?Clinic Level of Care Assessment Items ?TOOL 1 Quantity Score ?'[]'$  - Use when EandM and Procedure is performed on INITIAL visit 0 ?ASSESSMENTS - Nursing Assessment / Reassessment ?'[]'$  - General Physical Exam (combine w/ comprehensive assessment (listed just below) when performed on new ?0 ?pt. evals) ?'[]'$  - 0 ?Comprehensive Assessment (HX, ROS, Risk Assessments, Wounds Hx, etc.) ?ASSESSMENTS - Wound and Skin Assessment / Reassessment ?'[]'$  - Dermatologic / Skin Assessment (not related to wound area) 0 ?ASSESSMENTS - Ostomy and/or Continence Assessment and Care ?'[]'$  - Incontinence Assessment and Management 0 ?'[]'$  - 0 ?Ostomy Care Assessment and Management (repouching, etc.) ?PROCESS - Coordination of Care ?'[]'$  - Simple Patient / Family Education for ongoing care 0 ?'[]'$  - 0 ?Complex (extensive) Patient / Family Education for ongoing care ?'[]'$  - 0 ?Staff obtains Consents, Records, Test Results / Process Orders ?'[]'$  - 0 ?Staff telephones HHA, Nursing Homes / Clarify orders / etc ?'[]'$  - 0 ?Routine Transfer to another Facility (non-emergent condition) ?'[]'$  - 0 ?Routine Hospital Admission (non-emergent condition) ?'[]'$  - 0 ?New Admissions / Biomedical engineer / Ordering NPWT, Apligraf, etc. ?'[]'$  - 0 ?Emergency Hospital Admission (emergent condition) ?PROCESS - Special Needs ?'[]'$  - Pediatric / Minor Patient Management 0 ?'[]'$  - 0 ?Isolation Patient Management ?'[]'$  - 0 ?Hearing / Language / Visual special needs ?'[]'$  - 0 ?Assessment of Community assistance (transportation, D/C planning, etc.) ?'[]'$  - 0 ?Additional assistance / Altered mentation ?'[]'$  - 0 ?Support Surface(s) Assessment (bed, cushion, seat, etc.) ?INTERVENTIONS - Miscellaneous ?'[]'$  - External ear exam 0 ?'[]'$  - 0 ?Patient Transfer (multiple staff / Civil Service fast streamer / Similar devices) ?'[]'$  - 0 ?Simple Staple / Suture removal (25 or less) ?'[]'$  - 0 ?Complex Staple / Suture  removal (26 or more) ?'[]'$  - 0 ?Hypo/Hyperglycemic Management (do not check if billed separately) ?'[]'$  - 0 ?Ankle / Brachial Index (ABI) - do  not check if billed separately ?Has the patient been seen at the hospital within the last three years: Yes ?Total Score: 0 ?Level Of Care: ____ ?JONNE, ROTE ([id number]) ?Electronic Signature(s) ?Signed: 04/19/2022 3:40:11 PM By: Carlene Coria RN ?Entered By: Carlene Coria on 04/19/2022 13:08:49 ?SIENA, POEHLER. ([id number]) ?-------------------------------------------------------------------------------- ?Encounter Discharge Information Details ?Patient Name: Laura Santiago. ?Date of Service: 04/19/2022 12:30 PM ?Medical Record Number: [id number] ?Patient Account Number: [id number] ?Date of Birth/Sex: 1946/11/26 (76 y.o. F) ?Treating RN: Carlene Coria ?Primary Care Maddux First: [facility], [place] Other Clinician: ?Referring Mrk Buzby: [facility] ?Treating Rilya Longo/Extender: Kalman Shan ?Weeks in Treatment: 41 ?Encounter Discharge Information Items Post Procedure Vitals ?Discharge Condition: Stable ?Temperature (?F): 98.3 ?Ambulatory Status: [name] ?Pulse (bpm): 77 ?Discharge Destination: Home ?Respiratory Rate (breaths/min): 18 ?Transportation: Private Auto ?Blood Pressure (mmHg): 124/70 ?Accompanied By: self ?Schedule Follow-up Appointment: Yes ?Clinical Summary of Care: Patient Declined ?Electronic Signature(s) ?Signed: 04/19/2022 3:40:11 PM By: Carlene Coria RN ?Entered By: Carlene Coria on 04/19/2022 13:24:13 ?AMEAH, CHANDA. ([id number]) ?-------------------------------------------------------------------------------- ?Lower Extremity Assessment Details ?Patient Name: Laura Santiago. ?Date of Service: 04/19/2022 12:30 PM ?Medical Record Number: [id number] ?Patient Account Number: [id number] ?Date of Birth/Sex: Jan 17, 1946 (76 y.o. F) ?Treating RN: Carlene Coria ?Primary Care Hiroko Tregre: [facility], [place] Other Clinician: ?Referring Ifeanyi Mickelson: [facility] ?Treating Shamell Suarez/Extender: Kalman Shan ?Weeks in Treatment: 41 ?Edema Assessment ?Assessed: [Left: No] [Right: No] ?Edema: [Left: Ye] [Right: s] ?Calf ?Left: Right: ?Point of Measurement: 29 cm From Medial Instep 36 cm ?Ankle ?Left: Right: ?Point of Measurement: 10 cm From Medial Instep 26 cm ?Vascular Assessment ?Pulses: ?Dorsalis Pedis ?Palpable: [Left:Yes] ?Electronic Signature(s) ?Signed: 04/19/2022 3:40:11 PM By: Carlene Coria RN ?Entered By: Carlene Coria on 04/19/2022 12:54:11 ?LANEY, LOUDERBACK. ([id number]) ?-------------------------------------------------------------------------------- ?Multi Wound Chart Details ?Patient Name: YAZLEEN, MOLOCK. ?Date of Service: 04/19/2022 12:30 PM ?Medical Record Number: [id number] ?Patient Account Number: [id number] ?Date of Birth/Sex: 10-Jun-1946 (76 y.o. F) ?Treating RN: Carlene Coria ?Primary Care Gilma Bessette: [facility], [place] Other Clinician: ?Referring Nahomy Limburg: [facility] ?Treating Talulah Schirmer/Extender: Kalman Shan ?Weeks in Treatment: 41 ?Vital Signs ?Height(in): 66 ?Pulse(bpm): 77 ?Weight(lbs): 153 ?Blood Pressure(mmHg): 124/70 ?Body Mass Index(BMI): 24.7 ?Temperature(??F): 98.3 ?Respiratory Rate(breaths/min): 18 ?Photos: [N/A:N/A] ?Wound Location: Left, Medial Lower Leg Left, Lateral Lower Leg N/A ?Wounding Event: Gradually Appeared Gradually Appeared N/A ?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 ?Comorbid History: Hypertension, Osteoarthritis, Hypertension, Osteoarthritis, N/A ?Received Chemotherapy, Received Received Chemotherapy, Received ?Radiation Radiation ?Date Acquired: 04/06/2021 03/12/2022 N/A ?Weeks of Treatment: 41 5 N/A ?Wound Status: Open Open N/A ?Wound Recurrence: No No N/A ?Clustered Wound: Yes No N/A ?Measurements L x W x D (cm) 10x19x0.2 0.7x0.5x0.1 N/A ?Area (cm?) : 149.226 0.275 N/A ?Volume (cm?) : 29.845 0.027 N/A ?% Reduction in Area: -65.20% 95.50% N/A ?% Reduction in Volume: -65.20% 97.80% N/A ?Classification:  Full Thickness Without Exposed Full Thickness Without Exposed N/A ?Support Structures Support Structures ?Exudate Amount: Large Small N/A ?Exudate Type: Serosanguineous Serosanguineous N/A ?Exudate Color: r

## 2022-04-25 ENCOUNTER — Inpatient Hospital Stay: Payer: Medicare Other

## 2022-04-25 ENCOUNTER — Ambulatory Visit
Admission: RE | Admit: 2022-04-25 | Discharge: 2022-04-25 | Disposition: A | Discharge status: Home / Self Care | Payer: Medicare Other | Attending: Oncology | Admitting: Oncology

## 2022-04-25 ENCOUNTER — Other Ambulatory Visit: Payer: Self-pay | Admitting: Oncology

## 2022-04-25 ENCOUNTER — Ambulatory Visit
Admission: RE | Admit: 2022-04-25 | Discharge: 2022-04-25 | Disposition: A | Discharge status: Home / Self Care | Payer: Medicare Other | Source: Ambulatory Visit | Attending: Oncology | Admitting: Oncology

## 2022-04-25 ENCOUNTER — Inpatient Hospital Stay: Payer: Medicare Other | Attending: Oncology

## 2022-04-25 ENCOUNTER — Encounter: Payer: Self-pay | Admitting: Oncology

## 2022-04-25 ENCOUNTER — Inpatient Hospital Stay (HOSPITAL_BASED_OUTPATIENT_CLINIC_OR_DEPARTMENT_OTHER): Payer: Medicare Other | Admitting: Oncology

## 2022-04-25 VITALS — BP 149/78 | HR 81 | Temp 96.5°F | Resp 18 | Wt 146.9 lb

## 2022-04-25 DIAGNOSIS — C7951 Secondary malignant neoplasm of bone: Secondary | ICD-10-CM | POA: Diagnosis not present

## 2022-04-25 DIAGNOSIS — C787 Secondary malignant neoplasm of liver and intrahepatic bile duct: Secondary | ICD-10-CM | POA: Insufficient documentation

## 2022-04-25 DIAGNOSIS — Z5111 Encounter for antineoplastic chemotherapy: Secondary | ICD-10-CM | POA: Diagnosis present

## 2022-04-25 DIAGNOSIS — M25511 Pain in right shoulder: Secondary | ICD-10-CM

## 2022-04-25 DIAGNOSIS — C50919 Malignant neoplasm of unspecified site of unspecified female breast: Secondary | ICD-10-CM

## 2022-04-25 DIAGNOSIS — C779 Secondary and unspecified malignant neoplasm of lymph node, unspecified: Secondary | ICD-10-CM | POA: Diagnosis not present

## 2022-04-25 DIAGNOSIS — Z5112 Encounter for antineoplastic immunotherapy: Secondary | ICD-10-CM

## 2022-04-25 DIAGNOSIS — Z170 Estrogen receptor positive status [ER+]: Secondary | ICD-10-CM | POA: Insufficient documentation

## 2022-04-25 LAB — COMPREHENSIVE METABOLIC PANEL
ALT: 11 U/L (ref 0–44)
AST: 17 U/L (ref 15–41)
Albumin: 3.2 g/dL — ABNORMAL LOW (ref 3.5–5.0)
Alkaline Phosphatase: 73 U/L (ref 38–126)
Anion gap: 8 (ref 5–15)
BUN: 19 mg/dL (ref 8–23)
CO2: 25 mmol/L (ref 22–32)
Calcium: 8.3 mg/dL — ABNORMAL LOW (ref 8.9–10.3)
Chloride: 104 mmol/L (ref 98–111)
Creatinine, Ser: 1.41 mg/dL — ABNORMAL HIGH (ref 0.44–1.00)
GFR, Estimated: 38 mL/min — ABNORMAL LOW (ref >60)
Glucose, Bld: 122 mg/dL — ABNORMAL HIGH (ref 70–99)
Potassium: 3.9 mmol/L (ref 3.5–5.1)
Sodium: 137 mmol/L (ref 135–145)
Total Bilirubin: 0.6 mg/dL (ref 0.3–1.2)
Total Protein: 7.1 g/dL (ref 6.5–8.1)

## 2022-04-25 LAB — CBC WITH DIFFERENTIAL/PLATELET
Abs Immature Granulocytes: 0.03 10*3/uL (ref 0.00–0.07)
Basophils Absolute: 0 10*3/uL (ref 0.0–0.1)
Basophils Relative: 0 %
Eosinophils Absolute: 0.2 10*3/uL (ref 0.0–0.5)
Eosinophils Relative: 2 %
HCT: 28.1 % — ABNORMAL LOW (ref 36.0–46.0)
Hemoglobin: 8.7 g/dL — ABNORMAL LOW (ref 12.0–15.0)
Immature Granulocytes: 1 %
Lymphocytes Relative: 12 %
Lymphs Abs: 0.7 10*3/uL (ref 0.7–4.0)
MCH: 26.8 pg (ref 26.0–34.0)
MCHC: 31 g/dL (ref 30.0–36.0)
MCV: 86.5 fL (ref 80.0–100.0)
Monocytes Absolute: 0.5 10*3/uL (ref 0.1–1.0)
Monocytes Relative: 8 %
Neutro Abs: 4.8 10*3/uL (ref 1.7–7.7)
Neutrophils Relative %: 77 %
Platelets: 257 10*3/uL (ref 150–400)
RBC: 3.25 MIL/uL — ABNORMAL LOW (ref 3.87–5.11)
RDW: 17.6 % — ABNORMAL HIGH (ref 11.5–15.5)
WBC: 6.2 10*3/uL (ref 4.0–10.5)
nRBC: 0 % (ref 0.0–0.2)

## 2022-04-25 MED ORDER — SODIUM CHLORIDE 0.9 % IV SOLN
420.0000 mg | Freq: Once | INTRAVENOUS | Status: AC
  Administered 2022-04-25: 420 mg via INTRAVENOUS
  Filled 2022-04-25: qty 14

## 2022-04-25 MED ORDER — DIPHENHYDRAMINE HCL 25 MG PO CAPS
50.0000 mg | ORAL_CAPSULE | Freq: Once | ORAL | Status: AC
  Administered 2022-04-25: 50 mg via ORAL
  Filled 2022-04-25: qty 2

## 2022-04-25 MED ORDER — OXYCODONE HCL 5 MG PO TABS
5.0000 mg | ORAL_TABLET | Freq: Once | ORAL | Status: AC
  Administered 2022-04-25: 5 mg via ORAL
  Filled 2022-04-25: qty 1

## 2022-04-25 MED ORDER — SODIUM CHLORIDE 0.9 % IV SOLN
Freq: Once | INTRAVENOUS | Status: AC
  Filled 2022-04-25: qty 250

## 2022-04-25 MED ORDER — ACETAMINOPHEN 325 MG PO TABS
650.0000 mg | ORAL_TABLET | Freq: Once | ORAL | Status: AC
  Administered 2022-04-25: 650 mg via ORAL
  Filled 2022-04-25: qty 2

## 2022-04-25 MED ORDER — HEPARIN SOD (PORK) LOCK FLUSH 100 UNIT/ML IV SOLN
INTRAVENOUS | Status: AC
  Administered 2022-04-25: 500 [IU]
  Filled 2022-04-25: qty 5

## 2022-04-25 MED ORDER — TRASTUZUMAB-DKST CHEMO 150 MG IV SOLR
6.0000 mg/kg | Freq: Once | INTRAVENOUS | Status: AC
  Administered 2022-04-25: 420 mg via INTRAVENOUS
  Filled 2022-04-25: qty 20

## 2022-04-25 NOTE — Progress Notes (Signed)
? ? ? ?Hematology/Oncology Consult note ?[place]  ?[phone] [phone] ? ?Patient Care Team: ?[place] as PCP - General ?[name], MD as Consulting Physician (Hematology and Oncology)  ? ?Name of the patient: Laura Santiago  ?[id number]  ?[birth year]  ? ?Date of visit: 04/25/22 ? 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  ? ?Chief complaint/ Reason for visit-on treatment assessment prior to next cycle of Herceptin and Perjeta ? ?Heme/Onc history: Patient is a 76 year old female with a past medical history significant for stage IV CKD, history of DVT on Xarelto, venous stasis and chronic right lower extremity ulceration hypertension among other medical problems.  She had a screening mammogram in September 2014 which showed 2.1 x 2.3 x 1.8 cm irregular mass in her right breast.  It was ER 10% positive PR negative and HER2 positive +3.  She received neoadjuvant chemotherapy with Taxol Herceptin and Perjeta for 4 cycles followed by dose dense AC/Herceptin x4 which she completed in March 2015.  She had a right lumpectomy on 04/03/2014 which showed scant residual invasive ductal carcinoma [id number].  She completed 1 year of adjuvant Herceptin chemotherapy and also completed adjuvant radiation treatment.  She was recommended anastrozole which she took on and off starting November 2015 and stopped sometime in 2020. ?  ?She was then hospitalized with neck pain and was found to have lytic lesions involving C5-C6 with pathological vertebral fractures.  She underwent radiation treatment to this area.  Image guided biopsy of the L1 vertebral body showed metastatic carcinoma consistent with breast origin ER 10% PR 0% and HER2 amplified ratio 5.1 average HER2 signal number per cell 15.0 average CEP 17 signals number per cell 3.0.  Baseline echocardiogram on 05/02/2021 showed a normal EF of 62% she was recommended Taxol  Herceptin and Perjeta which she received for 2 cycles at [facility] until June 10, 2021 ?  ?She has chronic pain from her bone metastases for which she is currently on oxycodone 10 mg every 4 hours as needed and 12 mcg fentanyl patch.  She was seeing pain clinic when she was living in [name]. ?  ?Patient has also been on Zometa when she was in [name] but she does have some ongoing dental issues.  She has received Xgeva in the past as well.  Her last Delton See was in July 2021.  Last PET scan was on 03/21/2021 which showed diffuse osseous metastatic disease involving the head neck chest abdomen and pelvis and spine.  Left lung apex hypermetabolic nodule and multiple hypermetabolic liver lesions concerning for disease involvement.  Enlarged hypermetabolic left inguinal lymph nodes along with hypermetabolic external iliac and left supraclavicular lymph nodes ?  ?Patient is now moved to [place] to be close to her daughter.  She lives in an independent living. ?Currently patient is getting Taxol Herceptin and Perjeta every 3 weeks with weekly Taxol ? ?Interval history-patient currently reports significant pain in her right shoulder to the point that she does not have a full range of motion.  States that fentanyl patch and 10 mg as needed oxycodone has not been helping.  Denies any falls or injuries.  Her right leg has been swelling up more than usual. ? ?ECOG PS- 2 ?Pain scale- 5 ?Opioid associated constipation- no ? ?Review of systems- Review of Systems  ?Constitutional:  Positive for malaise/fatigue. Negative for chills, fever and weight loss.  ?HENT:  Negative for congestion, ear discharge and nosebleeds.   ?  Eyes:  Negative for blurred vision.  ?Respiratory:  Negative for cough, hemoptysis, sputum production, shortness of breath and wheezing.   ?Cardiovascular:  Negative for chest pain, palpitations, orthopnea and claudication.  ?Gastrointestinal:  Negative for abdominal pain, blood in stool, constipation, diarrhea, heartburn,  melena, nausea and vomiting.  ?Genitourinary:  Negative for dysuria, flank pain, frequency, hematuria and urgency.  ?Musculoskeletal:  Positive for joint pain (Right shoulder pain). Negative for back pain and myalgias.  ?Skin:  Negative for rash.  ?Neurological:  Negative for dizziness, tingling, focal weakness, seizures, weakness and headaches.  ?Endo/Heme/Allergies:  Does not bruise/bleed easily.  ?Psychiatric/Behavioral:  Negative for depression and suicidal ideas. The patient does not have insomnia.    ? ? ?Allergies  ?Allergen Reactions  ? Corticosteroids Other (See Comments)  ?  Pt trf from [name] and per primary md for her cancer tx. Notes that it causes agitation intolerance  ? Sulfa Antibiotics Other (See Comments)  ?  Pt moved from [name] and in MD notes she has allergy but we do not know reactions when taking the drug  ? Celebrex [Celecoxib] Rash  ? ? ? ?Past Medical History:  ?Diagnosis Date  ? ADHD (attention deficit hyperactivity disorder)   ? in [place], no date on md note  ? Anemia   ? IDA 11/26/2019, Anemia in stage 4 chronic kidney disease ([place]) 09/03/2020  ? Arthritis   ? osteoarthritis right knee 09/30/2014  ? Breast cancer ([place]) 10/05/2013  ? in [place] -, Her 2 is 3+  ? Cancer related pain 03/28/2021  ? in [name], md notes spine mets  ? cervical compression fracture 03/18/2021  ? in [place]  ? DVT of lower extremity, bilateral ([place]) 03/30/2014  ? in [name]  ? Generalized muscle weakness 03/31/2016  ? in [name]  ? GERD (gastroesophageal reflux disease) 05/15/2013  ? per md in [name]  ? Hyperparathyroidism, secondary ([place]) 04/25/2018  ? in [name]  ? Hypertension 02/20/2016  ? info from MD in [name]  ? Memory loss 02/05/2015  ? in [name]  ? Metabolic acidosis 88/82/8003  ? in [place]  ? Metabolic syndrome 49/17/9150  ? in [name]  ? Osteopenia after menopause 03/29/2016  ? in [name]  ? 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 in [name]  ? Stasis ulcer of left lower extremity ([place]) 03/29/2016  ? in [name]  ? ? ? ?Past Surgical  History:  ?Procedure Laterality Date  ? CESAREAN SECTION    ? unknown  ? fibroid removed  N/A   ? in [place] - unknown date  ? IR FLUORO GUIDE CV LINE LEFT  07/27/2021  ? IR PORT REPAIR CENTRAL VENOUS ACCESS DEVICE Left   ? In [name]  ? MOHS SURGERY N/A 02/25/2014  ? in [name]  ? ovary removed     ? unknown  ? PORTA CATH INSERTION N/A 02/13/2022  ? Procedure: PORTA CATH INSERTION;  Surgeon: Algernon Huxley, MD;  Location: [facility];  Service: Cardiovascular;  Laterality: N/A;  ? [place] CATARACT EXTRACAP,INSERT LENS Bilateral  Bilateral 09/05/2012  ? in [place]    ? unknown  ?  LUMPECTOMY Right 04/03/2014  ? in [place]  ? ? ?Social History  ? ?Socioeconomic History  ? Marital status: Divorced  ?  Spouse name: Not on file  ? Number of children: Not on file  ? Years of education: Not on file  ? Highest education level: Not on file  ?Occupational History  ?  Occupation: retired Teacher, music  ?  Comment: In [place]  ?Tobacco Use  ? Smoking status: Never  ? Smokeless tobacco: Never  ?Vaping Use  ? Vaping Use: Never used  ?Substance and Sexual Activity  ? Alcohol use: Not Currently  ? Drug use: Yes  ?  Comment: prescribed oxy, fentanyl patch  ? Sexual activity: Not Currently  ?Other Topics Concern  ? Not on file  ?Social History Narrative  ? Not on file  ? ?Social Determinants of Health  ? ?Financial Resource Strain: Low Risk   ? Difficulty of Paying Living Expenses: Not hard at all  ?Food Insecurity: No Food Insecurity  ? Worried About Charity fundraiser in the Last Year: Never true  ? Ran Out of Food in the Last Year: Never true  ?Transportation Needs: No Transportation Needs  ? Lack of Transportation (Medical): No  ? Lack of Transportation (Non-Medical): No  ?Physical Activity: Inactive  ? Days of Exercise per Week: 0 days  ? Minutes of Exercise per Session: 0 min  ?Stress: No Stress Concern Present  ? Feeling of Stress : Only a little  ?Social Connections: Socially Isolated  ? Frequency of  Communication with Friends and Family: Twice a week  ? Frequency of Social Gatherings with Friends and Family: Twice a week  ? Attends Religious Services: Never  ? Active Member of Clubs or Organizations: No  ? A

## 2022-04-25 NOTE — Patient Instructions (Signed)
[facility] AT [place]  Discharge Instructions: ?Thank you for choosing [facility] to provide your oncology and hematology care.  ?If you have a lab appointment with the [facility], please go directly to the [facility] and check in at the registration area. ? ?Wear comfortable clothing and clothing appropriate for easy access to any Portacath or PICC line.  ? ?We strive to give you quality time with your provider. You may need to reschedule your appointment if you arrive late (15 or more minutes).  Arriving late affects you and other patients whose appointments are after yours.  Also, if you miss three or more appointments without notifying the office, you may be dismissed from the clinic at the provider?s discretion.    ?  ?For prescription refill requests, have your pharmacy contact our office and allow 72 hours for refills to be completed.   ? ?Today you received the following chemotherapy and/or immunotherapy agents herceptin, perjeta    ?  ?To help prevent nausea and vomiting after your treatment, we encourage you to take your nausea medication as directed. ? ?BELOW ARE SYMPTOMS THAT SHOULD BE REPORTED IMMEDIATELY: ?*FEVER GREATER THAN 100.4 F (38 ?C) OR HIGHER ?*CHILLS OR SWEATING ?*NAUSEA AND VOMITING THAT IS NOT CONTROLLED WITH YOUR NAUSEA MEDICATION ?*UNUSUAL SHORTNESS OF BREATH ?*UNUSUAL BRUISING OR BLEEDING ?*URINARY PROBLEMS (pain or burning when urinating, or frequent urination) ?*BOWEL PROBLEMS (unusual diarrhea, constipation, pain near the anus) ?TENDERNESS IN MOUTH AND THROAT WITH OR WITHOUT PRESENCE OF ULCERS (sore throat, sores in mouth, or a toothache) ?UNUSUAL RASH, SWELLING OR PAIN  ?UNUSUAL VAGINAL DISCHARGE OR ITCHING  ? ?Items with * indicate a potential emergency and should be followed up as soon as possible or go to the Emergency Department if any problems should occur. ? ?Please show the CHEMOTHERAPY ALERT CARD or IMMUNOTHERAPY ALERT CARD at  check-in to the Emergency Department and triage nurse. ? ?Should you have questions after your visit or need to cancel or reschedule your appointment, please contact [facility] [place] AT [place]  [phone] and follow the prompts.  Office hours are 8:00 a.m. to 4:30 p.m. Monday - Friday. Please note that voicemails left after 4:00 p.m. may not be returned until the following business day.  We are closed weekends and major holidays. You have access to a nurse at all times for urgent questions. Please call the main number to the clinic [phone] and follow the prompts. ? ?For any non-urgent questions, you may also contact your provider using MyChart. We now offer e-Visits for anyone 50 and older to request care online for non-urgent symptoms. For details visit [email or web address]. ?  ?Also download the MyChart app! Go to the app store, search "MyChart", open the app, select [facility], and log in with your MyChart username and password. ? ?Due to Covid, a mask is required upon entering the hospital/clinic. If you do not have a mask, one will be given to you upon arrival. For doctor visits, patients may have 1 support person aged 34 or older with them. For treatment visits, patients cannot have anyone with them due to current Covid guidelines and our immunocompromised population.  ? ?Trastuzumab injection for infusion ?What is this medication? ?TRASTUZUMAB (tras TOO zoo mab) is a monoclonal antibody. It is used to treat breast cancer and stomach cancer. ?This medicine may be used for other purposes; ask your health care provider or pharmacist if you have questions. ?COMMON BRAND NAME(S): Herceptin, Belenda Cruise, Ogivri, Ontruzant,  Trazimera ?What should I tell my care team before I take this medication? ?They need to know if you have any of these conditions: ?heart disease ?heart failure ?lung or breathing disease, like asthma ?an unusual or allergic reaction to trastuzumab, benzyl  alcohol, or other medications, foods, dyes, or preservatives ?pregnant or trying to get pregnant ?breast-feeding ?How should I use this medication? ?This drug is given as an infusion into a vein. It is administered in a hospital or clinic by a specially trained health care professional. ?Talk to your pediatrician regarding the use of this medicine in children. This medicine is not approved for use in children. ?Overdosage: If you think you have taken too much of this medicine contact a poison control center or emergency room at once. ?NOTE: This medicine is only for you. Do not share this medicine with others. ?What if I miss a dose? ?It is important not to miss a dose. Call your doctor or health care professional if you are unable to keep an appointment. ?What may interact with this medication? ?This medicine may interact with the following medications: ?certain types of chemotherapy, such as daunorubicin, doxorubicin, epirubicin, and idarubicin ?This list may not describe all possible interactions. Give your health care provider a list of all the medicines, herbs, non-prescription drugs, or dietary supplements you use. Also tell them if you smoke, drink alcohol, or use illegal drugs. Some items may interact with your medicine. ?What should I watch for while using this medication? ?Visit your doctor for checks on your progress. Report any side effects. Continue your course of treatment even though you feel ill unless your doctor tells you to stop. ?Call your doctor or health care professional for advice if you get a fever, chills or sore throat, or other symptoms of a cold or flu. Do not treat yourself. Try to avoid being around people who are sick. ?You may experience fever, chills and shaking during your first infusion. These effects are usually mild and can be treated with other medicines. Report any side effects during the infusion to your health care professional. Fever and chills usually do not happen with  later infusions. ?Do not become pregnant while taking this medicine or for 7 months after stopping it. Women should inform their doctor if they wish to become pregnant or think they might be pregnant. Women of child-bearing potential will need to have a negative pregnancy test before starting this medicine. There is a potential for serious side effects to an unborn child. Talk to your health care professional or pharmacist for more information. Do not breast-feed an infant while taking this medicine or for 7 months after stopping it. ?Women must use effective birth control with this medicine. ?What side effects may I notice from receiving this medication? ?Side effects that you should report to your doctor or health care professional as soon as possible: ?allergic reactions like skin rash, itching or hives, swelling of the face, lips, or tongue ?chest pain or palpitations ?cough ?dizziness ?feeling faint or lightheaded, falls ?fever ?general ill feeling or flu-like symptoms ?signs of worsening heart failure like breathing problems; swelling in your legs and feet ?unusually weak or tired ?Side effects that usually do not require medical attention (report to your doctor or health care professional if they continue or are bothersome): ?bone pain ?changes in taste ?diarrhea ?joint pain ?nausea/vomiting ?weight loss ?This list may not describe all possible side effects. Call your doctor for medical advice about side effects. You may report side effects  to FDA at 1-800-FDA-1088. ?Where should I keep my medication? ?This drug is given in a hospital or clinic and will not be stored at home. ?NOTE: This sheet is a summary. It may not cover all possible information. If you have questions about this medicine, talk to your doctor, pharmacist, or health care provider. ?? 2023 Elsevier/Gold Standard (2016-12-12 00:00:00) ? ?Pertuzumab injection ?What is this medication? ?PERTUZUMAB (per TOOZ ue mab) is a monoclonal antibody. It  is used to treat breast cancer. ?This medicine may be used for other purposes; ask your health care provider or pharmacist if you have questions. ?COMMON BRAND NAME(S): PERJETA ?What should I tell my care team bef

## 2022-04-25 NOTE — Progress Notes (Signed)
Pt c/o rt arm pain as if someone gave her an injection. Applied a patch this morning, along with knee pain that started yesterday. Not able to sleep lately due to pain and no appetite "nothing looks good anymore" ?

## 2022-04-25 NOTE — Progress Notes (Signed)
[facility]- Per MD, Dr. Janese Banks, order: hold/do not give Zometa today; patient to receive scheduled Ogivri and Perjeta treatment only today. ?

## 2022-04-25 NOTE — Progress Notes (Signed)
Pt tolerated all infusions well today.  Pt did complain of right arm/shoulder pain during infusion.  Given oxycodone '5mg'$  per Dr. Janese Banks and patient sent for xray.  Pt left infusion suite stable and ambulatory with her walker.  ?

## 2022-04-26 ENCOUNTER — Encounter: Payer: Self-pay | Admitting: *Deleted

## 2022-04-26 ENCOUNTER — Encounter (HOSPITAL_BASED_OUTPATIENT_CLINIC_OR_DEPARTMENT_OTHER): Payer: Medicare Other | Admitting: Internal Medicine

## 2022-04-26 DIAGNOSIS — I87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: Secondary | ICD-10-CM | POA: Diagnosis not present

## 2022-04-26 DIAGNOSIS — L97822 Non-pressure chronic ulcer of other part of left lower leg with fat layer exposed: Secondary | ICD-10-CM

## 2022-04-26 DIAGNOSIS — L97929 Non-pressure chronic ulcer of unspecified part of left lower leg with unspecified severity: Secondary | ICD-10-CM | POA: Insufficient documentation

## 2022-04-26 NOTE — Progress Notes (Signed)
I sent a message to casey through my chart and await a response ?

## 2022-04-27 ENCOUNTER — Other Ambulatory Visit: Payer: Self-pay | Admitting: Oncology

## 2022-04-27 ENCOUNTER — Other Ambulatory Visit: Payer: Medicare Other | Admitting: Student

## 2022-04-27 DIAGNOSIS — R600 Localized edema: Secondary | ICD-10-CM

## 2022-04-27 DIAGNOSIS — C50919 Malignant neoplasm of unspecified site of unspecified female breast: Secondary | ICD-10-CM

## 2022-04-27 DIAGNOSIS — Z515 Encounter for palliative care: Secondary | ICD-10-CM

## 2022-04-27 DIAGNOSIS — R52 Pain, unspecified: Secondary | ICD-10-CM

## 2022-04-27 NOTE — Progress Notes (Signed)
Therapist, nutritional Palliative Care Consult Note Telephone: [phone]  Fax: [phone]    Date of encounter: 04/27/22 3:13 PM PATIENT NAME: Laura Santiago [address] [place] [address]   [phone] (home)  DOB: Jul 26, 1946 MRN: [id number] PRIMARY CARE PROVIDER:    [facility], [address],  [address] [place] [address] [phone]  REFERRING PROVIDER:   [facility] [address] [place],  [place] [address] [phone]  RESPONSIBLE PARTY:    Contact Information     Name Relation Home Work Mobile   [name],[name] Daughter   [phone]        I met [place] in [place]. Palliative Care was asked to follow this patient by consultation request of  [facility] to address advance care planning and complex medical decision making. This is a follow up visit.                                   ASSESSMENT AND PLAN / RECOMMENDATIONS:   Advance Care Planning/Goals of Care: Goals include to maximize quality of life and symptom management. Patient/health care surrogate gave his/her permission to discuss. Our advance care planning conversation included a discussion about:    The value and importance of advance care planning  Experiences with loved ones who have been seriously ill or have died  Exploration of personal, cultural or spiritual beliefs that might influence medical decisions  Exploration of goals of care in the event of a sudden injury or illness  CODE STATUS: DNR  Symptom Management/Plan:  Breast cancer with metastases to lymph node and bone. Continue Taxol Herceptin and Perjeta every 3 weeks, with weekly Taxol. Follow up with oncology as scheduled.  Pain-x-ray of right shoulder d/t worsening pain on 5/17; shows advanced degenerative changes. Patient with chronic pain due to bone mets, chronic vascular wound to LLE. Continue Duragesic 25 mcg every 72 hours, her oxycodone was  increased to 15 mg per oncologist; she needs to pick up prescription she is encouraged to take as directed. Reminders set up on phone to change Duragesic patch every 72 hours.   Chronic venous stasis wound-to LLE. Continue treatments as directed per Wound Clinic, HH SN to assist with wound care.   LE edema-patient with 1+ edema to RLE, she is encouraged to wear compression hose, elevate legs.   Follow up Palliative Care Visit: Palliative care will continue to follow for complex medical decision making, advance care planning, and clarification of goals. Return in 6 weeks or prn.  This visit was coded based on medical decision making (MDM).  PPS: 60%  HOSPICE ELIGIBILITY/DIAGNOSIS: TBD  Chief Complaint: Palliative Medicine follow up visit.   HISTORY OF PRESENT ILLNESS:  Laura Santiago is a 76 y.o. 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 Hx of DVT, CKD 3, ADHD, peripheral neuropathy secondary to chemotherapy.    Patient endorses worsening pain. Her oxycodone was increased to 15 mg; she needs to pick up new prescription. She states she has been taking PRN oxycodone every 6-8 hours. She has LLE wound, seen by wound clinic and [facility] nurse is coming in 2 times a week for wound care. Appetite is fair. Sleeping okay. No falls. A 10-point ROS is negative, except for the pertinent positive and negatives detailed per the HPI.  History obtained from review of EMR, discussion with primary team, and interview with family, facility staff/caregiver and/or [name].  I reviewed available labs, medications, imaging, studies and related documents from the EMR.  Records reviewed and summarized above.   Physical Exam: Constitutional: NAD General: frail appearing, thin EYES: anicteric sclera, lids intact, no discharge  ENMT: intact hearing, oral mucous membranes moist, dentition intact CV: S1S2, RRR, 1+ RLE edema Pulmonary:  LCTA, no increased work of breathing, no cough, room air Abdomen:  normo-active BS + 4 quadrants, soft and non tender, no ascites GU: deferred MSK: moves all extremities, ambulatory Skin: warm and dry, no rashes or wounds on visible skin, mild erythema to RLE, no warmth Neuro: + generalized weakness Psych: anxious affect, A and O x 3 Hem/lymph/immuno: no widespread bruising   Thank you for the opportunity to participate in the care of [name].  The palliative care team will continue to follow. Please call our office at [phone] if we can be of additional assistance.   Ezekiel Slocumb, NP   COVID-19 PATIENT SCREENING TOOL Asked and negative response unless otherwise noted:   Have you had symptoms of covid, tested positive or been in contact with someone with symptoms/positive test in the past 5-10 days? No

## 2022-04-28 ENCOUNTER — Encounter: Payer: Self-pay | Admitting: Oncology

## 2022-04-28 MED ORDER — OXYCODONE HCL 15 MG PO TABS: 15.0000 mg | ORAL_TABLET | ORAL | 0 refills | Status: DC | PRN

## 2022-04-29 NOTE — Progress Notes (Signed)
Laura, Santiago ([id number]) Visit Report for 04/26/2022 Chief Complaint Document Details Patient Name: Laura Santiago, Laura Santiago. Date of Service: 04/26/2022 11:15 AM Medical Record Number: [id number] Patient Account Number: [id number] Date of Birth/Sex: November 30, 1946 (76 y.o. F) Treating RN: Levora Dredge Primary Care Provider: [facility], [place] Other Clinician: Referring Provider: [facility] Treating Provider/Extender: Yaakov Guthrie in Treatment: 42 Information Obtained from: Patient Chief Complaint Left lower extremity wound Right toe wounds Left upper lateral thigh wounds Electronic Signature(s) Signed: 04/26/2022 12:38:43 PM By: Kalman Shan DO Entered By: Kalman Shan on 04/26/2022 12:25:21 Laura Santiago ([id number]) -------------------------------------------------------------------------------- Debridement Details Patient Name: Laura Santiago. Date of Service: 04/26/2022 11:15 AM Medical Record Number: [id number] Patient Account Number: [id number] Date of Birth/Sex: [birth year] (76 y.o. F) Treating RN: Alycia Rossetti Primary Care Provider: [facility] Other Clinician: Referring Provider: [facility] Treating Provider/Extender: Yaakov Guthrie in Treatment: 42 Debridement Performed for Wound #1 Left,Medial Lower Leg Assessment: Performed By: Physician Kalman Shan, MD Debridement Type: Debridement Severity of Tissue Pre Debridement: Fat layer exposed Level of Consciousness (Pre- Awake and Alert procedure): Pre-procedure Verification/Time Out Yes - 12:04 Taken: Pain Control: Lidocaine 4% Topical Solution Total Area Debrided (L x W): 10.6 (cm) x 12.8 (cm) = 135.68 (cm) Tissue and other material Viable, Non-Viable, Slough, Subcutaneous, Slough debrided: Level: Skin/Subcutaneous Tissue Debridement Description: Excisional Instrument: Curette Bleeding: Minimum Hemostasis Achieved: Pressure Response to Treatment:  Procedure was tolerated well Level of Consciousness (Post- Awake and Alert procedure): Post Debridement Measurements of Total Wound Length: (cm) 10.6 Width: (cm) 12.8 Depth: (cm) 0.2 Volume: (cm) 21.313 Character of Wound/Ulcer Post Debridement: Stable Severity of Tissue Post Debridement: Fat layer exposed Post Procedure Diagnosis Same as Pre-procedure Electronic Signature(s) Signed: 04/26/2022 12:38:43 PM By: Kalman Shan DO Signed: 04/28/2022 8:14:28 AM By: Alycia Rossetti Entered By: Alycia Rossetti on 04/26/2022 12:06:44 Laura Santiago ([id number]) -------------------------------------------------------------------------------- HPI Details Patient Name: Laura Santiago. Date of Service: 04/26/2022 11:15 AM Medical Record Number: [id number] Patient Account Number: [id number] Date of Birth/Sex: [birth year] (76 y.o. F) Treating RN: Levora Dredge Primary Care Provider: [facility], [place] Other Clinician: Referring Provider: [facility], [place] Treating Provider/Extender: Yaakov Guthrie in Treatment: 60 History of Present Illness HPI Description: Admission 7/27 Laura Santiago is a 76 year old female with a past medical history of ADHD, metastatic breast cancer, stage IV chronic kidney disease, history of DVT on Xarelto and chronic venous insufficiency that presents to the clinic for a chronic left lower extremity wound. She recently moved to [facility] 4 days ago. She was being followed by wound care center in [name]. She reports a 10-year history of wounds to her left lower extremity that eventually do heal with debridement and compression therapy. She states that the current wound reopened 4 months ago and she is using Vaseline and Coban. She denies signs of infection. 8/3; patient presents for 1 week follow-up. She reports no issues or complaints today. She states she had vascular studies done in the last week. She denies signs of infection. She  brought her little service dog with her today. 8/17; patient presents for follow-up. She has missed her last clinic appointment. She states she took the wrap off and attempted to rewrap her leg. She is having difficulty with transportation. She has her service dog with her today. Overall she feels well and reports improvement in wound healing. She denies signs of infection. She reports owning an old Velcro wrap compression and has this at her living facility 9/14;  patient presents for follow-up. Patient states that over the past 2 to 3 weeks she developed toe wounds to her right foot. She attributes this to tight fitting shoes. She subsequently developed cellulitis in the right leg and has been treated by doxycycline by her oncologist. She reports improvement in symptoms however continues to have some redness and swelling to this leg. To the left lower extremity patient has been having her wraps changed with home health twice weekly. She states that the [facility] is not helping control the drainage. Other than that she has no issues or complaints today. She denies signs of infection to the left lower extremity. 9/21; patient presents for follow-up. She reports seeing infectious disease for her cellulitis. She reports no further management. She has home health that changes the wraps twice weekly. She has no issues or complaints today. She denies signs of infection. 10/5; patient presents for follow-up. She has no issues or complaints today. She denies signs of infection. She states that the right great toe has not been dressed by home health. 10/12; patient presents for follow-up. She has no issues or complaints today. She reports improvement in her wound healing. She has been using silver alginate to the right great toe wound. She denies signs of infection. 10/26; patient presents for follow-up. Home health did not have sorbact so they continued to use Hydrofera Blue under the wrap. She has  been using silver alginate to the great toe wound however she did not have a dressing in place today. She currently denies signs of infection. 11/2; patient presents for follow-up. She has been using sorb act under the compression wrap. She reports using silver alginate to the toe wound again she does not have a dressing in place. She currently denies signs of infection. 11/23; patient presents for follow-up. Unfortunately she has missed her last 2 clinic appointments. She was last seen 3 weeks ago. She did her own compression wrap with Kerlix and Coban yesterday after seeing vein and vascular. She has not been dressing her right great toe wound. She currently denies signs of infection. 11/30; patient presents for 1 week follow-up. She states she changed her dressing last week prior to home health and use sorb act with Dakin's and Hydrofera Blue. Home health has changed the dressing as well and they have been using sorbact. Today she reports increased redness to her right lower extremity. She has a history of cellulitis to this leg. She has been using silver alginate to the right great toe. Unfortunately she had an episode of diarrhea prior to coming in and had feces all over the right leg and to the wrap of her left leg. 12/7; patient presents for 1 week follow-up. She states that home health did not come out to change the dressing and she took it off yesterday. It is unclear if she is dressing the right toe wound. She denies signs of infection. 12/14; patient presents for 1 week follow-up. She has no issues or complaints today. 12/21; patient presents for follow-up. She has no issues or complaints today. She denies signs of infection. 12/28/2021; patient presents for follow-up. She was hospitalized for sepsis secondary to right lower extremity cellulitis On 12/23. She states she is currently at a SNF. She states that she was started on doxycycline this morning for her right great toe swelling and  redness. She is not sure what dressings have been done to her left lower extremity for the past 3 weeks. She says its been mainly  gauze with an Ace wrap. 1/25; patient presents for follow-up. She is still residing in a skilled nursing facility. She reports mild pain to the left lower extremity wound bed. She states she is going to see a podiatrist soon. 2/8; patient presents for follow-up. She has moved back to her residential community from her skilled nursing facility. She has no issues or complaints today. She denies signs of systemic infections. 2/15; patient presents for follow-up. He has no issues or complaints today. She denies systemic signs of infection. 2/22; patient presents for follow-up. She has no issues or complaints today. She denies signs of infection. Laura Santiago, Laura Santiago ([id number]) 3/1; patient presents for follow-up. She states that home health came out the day after she was seen in our clinic and yesterday to do the wrap change. She denies signs of infection. She reports excoriated skin on the ankle. 3/8; patient presents for follow-up. She has no issues or complaints today. She denies signs of infection. 3/15; patient presents for follow-up. Home health has been coming out to change the dressings. She reports more tenderness to the wound site. She denies purulent drainage, increased warmth or erythema to the area. 4/5; patient presents for follow-up. She has missed her last 2 clinic appointments. I have not seen her in 3 weeks. She was recently hospitalized for altered mental status. She was involuntarily committed. She was evaluated by psychiatry and deemed to have competency. There was no specific cause of her altered mental status. It was concluded that her physical and mental health were declining due to her chronic medical conditions. Currently home health has been coming out for dressing changes. Patient has also been doing her own dressing changes. She reports more skin  breakdown to the periwound and now has a new wound. She denies fever/chills. She reports continued tenderness to the wound site. 4/12; patient with significant venous insufficiency and a large wound on her left lower leg taking up about 80% of the circumference of her lower leg. Cultures of this grew MRSA and Pseudomonas. She had completed a course of ciprofloxacin now is starting doxycycline. She has been using Dakin's wet-to-dry and a Tubigrip. She has home health twice a week and we change it once. 4/19; patient presents for follow-up. She completed her course of doxycycline. She has been using Dakin's wet-to-dry dressing and Tubigrip. Home health changes the dressing twice weekly. Currently she has no issues or complaints. 4/26; patient presents for follow-up. At last clinic visit orders for home health were Iodosorb under compression therapy. Unfortunately they did not have the dressing and have been using Dakin's and gentamicin under the wrap. Patient currently denies signs of infection. She has no issues or complaints today. 5/3; patient presents for follow-up. Again Iodosorb has not been used under the compression therapy when home health comes out to change the wrap and dressing. They have been using Sorbact. It is unclear why this is happening since we send orders weekly to the agency. She denies signs of infection. Patient has not purchased the [place] antibiotics. We reached out to the company and they said they have been trying to contact her on a regular basis. We gave the patient the number to call to order the medication. 5/10; patient presents for follow-up. She has no issues or complaints today. Again home health has not been using Iodosorb. Mepilex was on the wound bed. No other dressings noted. She brought in her [facility]stone antibiotics. She denies signs of infection. 5/17; patient presents for follow-up. Home  health has come out twice since she was last seen. Joint well she has been  using Keystone antibiotic with Sorbact under the compression wrap. She has no issues or complaints today. She denies signs of infection. Electronic Signature(s) Signed: 04/26/2022 12:38:43 PM By: Kalman Shan DO Entered By: Kalman Shan on 04/26/2022 12:26:45 Laura Santiago ([id number]) -------------------------------------------------------------------------------- Physical Exam Details Patient Name: ARELENE, MORONI. Date of Service: 04/26/2022 11:15 AM Medical Record Number: [id number] Patient Account Number: [id number] Date of Birth/Sex: 05/27/1946 (76 y.o. F) Treating RN: Levora Dredge Primary Care Provider: [facility], [place] Other Clinician: Referring Provider: [facility] Treating Provider/Extender: Yaakov Guthrie in Treatment: 93 Constitutional . Cardiovascular . Psychiatric . Notes Left lower extremity: Large open wound with granulation tissue, nonviable tissue and biofilm. No surrounding signs of infection. Electronic Signature(s) Signed: 04/26/2022 12:38:43 PM By: Kalman Shan DO Entered By: Kalman Shan on 04/26/2022 12:27:28 Laura Santiago ([id number]) -------------------------------------------------------------------------------- Physician Orders Details Patient Name: Laura Santiago. Date of Service: 04/26/2022 11:15 AM Medical Record Number: [id number] Patient Account Number: [id number] Date of Birth/Sex: 05/17/1946 (76 y.o. F) Treating RN: Alycia Rossetti Primary Care Provider: [facility] Other Clinician: Referring Provider: [facility] Treating Provider/Extender: Yaakov Guthrie in Treatment: 5 Verbal / Phone Orders: No Diagnosis Coding Follow-up Appointments o Return Appointment in 1 week. o Nurse Visit as needed [facility] for wound care. May utilize formulary equivalent dressing for wound treatment orders  unless otherwise specified. Home Health Nurse may visit PRN to address patientos wound care needs. o **Please direct any NON-WOUND related issues/requests for orders to patient's Primary Care Physician. **If current dressing causes regression in wound condition, may D/C ordered dressing product/s and apply Normal Saline Moist Dressing daily until next [place] or Other MD appointment. **Notify Wound Healing Center of regression in wound condition at [phone]. o Other Home Health Orders/Instructions: - Dressing change 3 x weekly, once by wound care and once at wound clinic weekly. PLEASE make sure frequency between dressing changes is appropriate. Bathing/ Shower/ Hygiene o May shower with wound dressing protected with water repellent cover or cast protector. o No tub bath. Anesthetic (Use 'Patient Medications' Section for Anesthetic Order Entry) o Lidocaine applied to wound bed Edema Control - Lymphedema / Segmental Compressive Device / Other o Optional: One layer of unna paste to top of compression wrap (to act as an anchor). - PLEASE when applying wrap start from toes and go up to just below knee o Elevate, Exercise Daily and Avoid Standing for Long Periods of Time. o Elevate legs to the level of the heart and pump ankles as often as possible o Elevate leg(s) parallel to the floor when sitting. o DO YOUR BEST to sleep in the bed at night. DO NOT sleep in your recliner. Long hours of sitting in a recliner leads to swelling of the legs and/or potential wounds on your backside. Additional Orders / Instructions o Follow Nutritious Diet and Increase Protein Intake Medications-Please add to medication list. o P.O. Antibiotics - continue as prescribed Wound Treatment Wound #1 - Lower Leg Wound Laterality: Left, Medial Cleanser: Wound Cleanser (Home Health) 3 x Per Week/30 Days Discharge Instructions: Wash your hands with soap and water. Remove old  dressing, discard into plastic bag and place into trash. Cleanse the wound with Wound Cleanser prior to applying a clean dressing using gauze sponges, not tissues or cotton balls. Do not scrub or use  excessive force. Pat dry using gauze sponges, not tissue or cotton balls. Peri-Wound Care: Nystatin Cream USP 30 (g) 3 x Per Week/30 Days Discharge Instructions: Use Nystatin Cream as directed. MIx 1:1 with TCA cream and applied to peri wound Peri-Wound Care: Triamcinolone Acetonide Cream, 0.1%, 15 (g) tube 3 x Per Week/30 Days Discharge Instructions: Apply as directed. mix 1:1 with nystatin cream for peri wound Primary Dressing: Cutimed Sorbact 1.5x 2.38 (in/in) 3 x Per Week/30 Days Discharge Instructions: A bacteria- and fungi binding wound dressing, suitable for cavities and fistulas. It is suitable as a wound filler and allows the passage of wound exudate into a secondary dressing. The dressing helps reducing odor and pain and can improve healing. Primary Dressing: keystone compound 3 x Per Week/30 Days Discharge Instructions: apply to wound bed as directed on medication label PLEASE follow direction on pill bottle for proper mixing Laura Santiago, Laura Santiago ([id number]) Secondary Dressing: Zetuvit Absorbent Pad, 4x8 (in/in) 3 x Per Week/30 Days Compression Wrap: 3-LAYER WRAP - Profore Lite LF 3 Multilayer Compression Bandaging System 3 x Per Week/30 Days Discharge Instructions: Apply 3 multi-layer wrap as prescribed. Electronic Signature(s) Signed: 04/26/2022 12:38:43 PM By: Kalman Shan DO Entered By: Kalman Shan on 04/26/2022 12:28:32 Laura Santiago ([id number]) -------------------------------------------------------------------------------- Problem List Details Patient Name: Laura Santiago, Laura Santiago. Date of Service: 04/26/2022 11:15 AM Medical Record Number: [id number] Patient Account Number: [id number] Date of Birth/Sex: [birth year] (76 y.o. F) Treating RN: Levora Dredge Primary Care  Provider: [facility], [place] Other Clinician: Referring Provider: [facility] Treating Provider/Extender: Yaakov Guthrie in Treatment: 68 Active Problems ICD-10 Encounter Code Description Active Date MDM Diagnosis L97.822 Non-pressure chronic ulcer of other part of left lower leg with fat layer 11/02/2021 No Yes exposed I87.312 Chronic venous hypertension (idiopathic) with ulcer of left lower 11/02/2021 No Yes extremity I87.2 Venous insufficiency (chronic) (peripheral) 07/06/2021 No Yes Z79.01 Long term (current) use of anticoagulants 07/06/2021 No Yes I10 Essential (primary) hypertension 07/06/2021 No Yes 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 07/06/2021 No Yes Inactive Problems ICD-10 Code Description Active Date Inactive Date S81.802A Unspecified open wound, left lower leg, initial encounter 07/06/2021 07/06/2021 S91.101A Unspecified open wound of right great toe without damage to nail, initial 08/24/2021 08/24/2021 encounter S91.104A Unspecified open wound of right lesser toe(s) without damage to nail, initial 08/24/2021 08/24/2021 encounter Resolved Problems ICD-10 Code Description Active Date Resolved Date S91.104D Unspecified open wound of right lesser toe(s) without damage to nail, 08/31/2021 08/31/2021 subsequent encounter Laura Santiago, Laura Santiago ([id number]) S91.201D Unspecified open wound of right great toe with damage to nail, subsequent 08/31/2021 08/31/2021 encounter Electronic Signature(s) Signed: 04/26/2022 12:38:43 PM By: Kalman Shan DO Entered By: Kalman Shan on 04/26/2022 12:25:18 Laura Santiago ([id number]) -------------------------------------------------------------------------------- Progress Note Details Patient Name: Laura Santiago. Date of Service: 04/26/2022 11:15 AM Medical Record Number: [id number] Patient Account Number: [id number] Date of Birth/Sex: 28-Nov-1946 (76 y.o. F) Treating RN: Levora Dredge Primary Care Provider:  [facility], [place] Other Clinician: Referring Provider: [facility], [place] Treating Provider/Extender: Yaakov Guthrie in Treatment: 42 Subjective Chief Complaint Information obtained from Patient Left lower extremity wound Right toe wounds Left upper lateral thigh wounds History of Present Illness (HPI) Admission 7/27 Ms. Candi Profit is a 76 year old female with a past medical history of ADHD, metastatic breast cancer, stage IV chronic kidney disease, history of DVT on Xarelto and chronic venous insufficiency that presents to the clinic for a chronic left lower extremity wound. She recently moved to [facility] 4 days ago.  She was being followed by wound care center in [name]. She reports a 10-year history of wounds to her left lower extremity that eventually do heal with debridement and compression therapy. She states that the current wound reopened 4 months ago and she is using Vaseline and Coban. She denies signs of infection. 8/3; patient presents for 1 week follow-up. She reports no issues or complaints today. She states she had vascular studies done in the last week. She denies signs of infection. She brought her little service dog with her today. 8/17; patient presents for follow-up. She has missed her last clinic appointment. She states she took the wrap off and attempted to rewrap her leg. She is having difficulty with transportation. She has her service dog with her today. Overall she feels well and reports improvement in wound healing. She denies signs of infection. She reports owning an old Velcro wrap compression and has this at her living facility 9/14; patient presents for follow-up. Patient states that over the past 2 to 3 weeks she developed toe wounds to her right foot. She attributes this to tight fitting shoes. She subsequently developed cellulitis in the right leg and has been treated by doxycycline by her oncologist. She reports improvement in  symptoms however continues to have some redness and swelling to this leg. To the left lower extremity patient has been having her wraps changed with home health twice weekly. She states that the [facility] is not helping control the drainage. Other than that she has no issues or complaints today. She denies signs of infection to the left lower extremity. 9/21; patient presents for follow-up. She reports seeing infectious disease for her cellulitis. She reports no further management. She has home health that changes the wraps twice weekly. She has no issues or complaints today. She denies signs of infection. 10/5; patient presents for follow-up. She has no issues or complaints today. She denies signs of infection. She states that the right great toe has not been dressed by home health. 10/12; patient presents for follow-up. She has no issues or complaints today. She reports improvement in her wound healing. She has been using silver alginate to the right great toe wound. She denies signs of infection. 10/26; patient presents for follow-up. Home health did not have sorbact so they continued to use Hydrofera Blue under the wrap. She has been using silver alginate to the great toe wound however she did not have a dressing in place today. She currently denies signs of infection. 11/2; patient presents for follow-up. She has been using sorb act under the compression wrap. She reports using silver alginate to the toe wound again she does not have a dressing in place. She currently denies signs of infection. 11/23; patient presents for follow-up. Unfortunately she has missed her last 2 clinic appointments. She was last seen 3 weeks ago. She did her own compression wrap with Kerlix and Coban yesterday after seeing vein and vascular. She has not been dressing her right great toe wound. She currently denies signs of infection. 11/30; patient presents for 1 week follow-up. She states she changed her dressing  last week prior to home health and use sorb act with Dakin's and Hydrofera Blue. Home health has changed the dressing as well and they have been using sorbact. Today she reports increased redness to her right lower extremity. She has a history of cellulitis to this leg. She has been using silver alginate to the right great toe. Unfortunately she had an episode of  diarrhea prior to coming in and had feces all over the right leg and to the wrap of her left leg. 12/7; patient presents for 1 week follow-up. She states that home health did not come out to change the dressing and she took it off yesterday. It is unclear if she is dressing the right toe wound. She denies signs of infection. 12/14; patient presents for 1 week follow-up. She has no issues or complaints today. 12/21; patient presents for follow-up. She has no issues or complaints today. She denies signs of infection. 12/28/2021; patient presents for follow-up. She was hospitalized for sepsis secondary to right lower extremity cellulitis On 12/23. She states she is currently at a SNF. She states that she was started on doxycycline this morning for her right great toe swelling and redness. She is not sure what dressings have been done to her left lower extremity for the past 3 weeks. She says its been mainly gauze with an Ace wrap. 1/25; patient presents for follow-up. She is still residing in a skilled nursing facility. She reports mild pain to the left lower extremity wound bed. She states she is going to see a podiatrist soon. 2/8; patient presents for follow-up. She has moved back to her residential community from her skilled nursing facility. She has no issues or complaints today. She denies signs of systemic infections. Laura Santiago, Laura Santiago ([id number]) 2/15; patient presents for follow-up. He has no issues or complaints today. She denies systemic signs of infection. 2/22; patient presents for follow-up. She has no issues or complaints today.  She denies signs of infection. 3/1; patient presents for follow-up. She states that home health came out the day after she was seen in our clinic and yesterday to do the wrap change. She denies signs of infection. She reports excoriated skin on the ankle. 3/8; patient presents for follow-up. She has no issues or complaints today. She denies signs of infection. 3/15; patient presents for follow-up. Home health has been coming out to change the dressings. She reports more tenderness to the wound site. She denies purulent drainage, increased warmth or erythema to the area. 4/5; patient presents for follow-up. She has missed her last 2 clinic appointments. I have not seen her in 3 weeks. She was recently hospitalized for altered mental status. She was involuntarily committed. She was evaluated by psychiatry and deemed to have competency. There was no specific cause of her altered mental status. It was concluded that her physical and mental health were declining due to her chronic medical conditions. Currently home health has been coming out for dressing changes. Patient has also been doing her own dressing changes. She reports more skin breakdown to the periwound and now has a new wound. She denies fever/chills. She reports continued tenderness to the wound site. 4/12; patient with significant venous insufficiency and a large wound on her left lower leg taking up about 80% of the circumference of her lower leg. Cultures of this grew MRSA and Pseudomonas. She had completed a course of ciprofloxacin now is starting doxycycline. She has been using Dakin's wet-to-dry and a Tubigrip. She has home health twice a week and we change it once. 4/19; patient presents for follow-up. She completed her course of doxycycline. She has been using Dakin's wet-to-dry dressing and Tubigrip. Home health changes the dressing twice weekly. Currently she has no issues or complaints. 4/26; patient presents for follow-up. At last  clinic visit orders for home health were Iodosorb under compression therapy. Unfortunately they did not  have the dressing and have been using Dakin's and gentamicin under the wrap. Patient currently denies signs of infection. She has no issues or complaints today. 5/3; patient presents for follow-up. Again Iodosorb has not been used under the compression therapy when home health comes out to change the wrap and dressing. They have been using Sorbact. It is unclear why this is happening since we send orders weekly to the agency. She denies signs of infection. Patient has not purchased the [place] antibiotics. We reached out to the company and they said they have been trying to contact her on a regular basis. We gave the patient the number to call to order the medication. 5/10; patient presents for follow-up. She has no issues or complaints today. Again home health has not been using Iodosorb. Mepilex was on the wound bed. No other dressings noted. She brought in her Keystone antibiotics. She denies signs of infection. 5/17; patient presents for follow-up. Home health has come out twice since she was last seen. Joint well she has been using Keystone antibiotic with Sorbact under the compression wrap. She has no issues or complaints today. She denies signs of infection. Objective Constitutional Vitals Time Taken: 11:37 AM, Height: 66 in, Weight: 153 lbs, BMI: 24.7, Temperature: 98.3 F, Pulse: 86 bpm, Respiratory Rate: 18 breaths/min, Blood Pressure: 154/78 mmHg. General Notes: Left lower extremity: Large open wound with granulation tissue, nonviable tissue and biofilm. No surrounding signs of infection. Integumentary (Hair, Skin) Wound #1 status is Open. Original cause of wound was Gradually Appeared. The date acquired was: 04/06/2021. The wound has been in treatment 42 weeks. The wound is located on the Left,Medial Lower Leg. The wound measures 10.6cm length x 12.8cm width x 0.2cm depth;  106.563cm^2 area and 21.313cm^3 volume. There is Fat Layer (Subcutaneous Tissue) exposed. There is no tunneling or undermining noted. There is a large amount of serosanguineous drainage noted. The wound margin is flat and intact. There is medium (34-66%) red, pink granulation within the wound bed. There is a medium (34-66%) amount of necrotic tissue within the wound bed including Adherent Slough. 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 ([id number]) Long term (current) use of anticoagulants Essential (primary) hypertension Secondary malignant neoplasm of breast Patient's wound has shown improvement in size in appearance since last clinic visit. I debrided nonviable tissue. I recommended continuing the course with Keystone antibiotic and Sorbact under 3 layer compression. 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 There was a Excisional Skin/Subcutaneous Tissue Debridement with a total area of 135.68 sq cm performed by Kalman Shan, MD. With the following instrument(s): Curette to remove Viable and Non-Viable tissue/material. Material removed includes Subcutaneous Tissue and Slough and after achieving pain control using Lidocaine 4% Topical Solution. No specimens were taken. A time out was conducted at 12:04, prior to the start of the procedure. A Minimum amount of bleeding was controlled with Pressure. The procedure was tolerated well. Post Debridement Measurements: 10.6cm length x 12.8cm width x 0.2cm depth; 21.313cm^3 volume. Character of Wound/Ulcer Post Debridement is stable. Severity of Tissue Post Debridement is: Fat layer  exposed. Post procedure Diagnosis Wound #1: Same as Pre-Procedure Plan Follow-up Appointments: Return Appointment in 1 week. Nurse Visit  as needed Home Health: [facility]: - [place] for wound care. May utilize formulary equivalent dressing for wound treatment orders unless otherwise specified. Home Health Nurse may visit PR[facility] to address patient s wound care needs. **Please direct any NON-WOUND related issues/requests for orders to patient's Primary Care Physician. **If current dressing causes regression in wound condition, may D/C ordered dressing product/s and apply Normal Saline Moist Dressing daily until next [place] or Other MD appointment. **Notify [facility] of regression in wound condition at [phone]. Other Home Health Orders/Instructions: - Dressing change 3 x weekly, once by wound care and once at wound clinic weekly. PLEASE make sure frequency between dressing changes is appropriate. Bathing/ Shower/ Hygiene: May shower with wound dressing protected with water repellent cover or cast protector. No tub bath. Anesthetic (Use 'Patient Medications' Section for Anesthetic Order Entry): Lidocaine applied to wound bed Edema Control - Lymphedema / Segmental Compressive Device / Other: Optional: One layer of unna paste to top of compression wrap (to act as an anchor). - PLEASE when applying wrap start from toes and go up to just below knee Elevate, Exercise Daily and Avoid Standing for Long Periods of Time. Elevate legs to the level of the heart and pump ankles as often as possible Elevate leg(s) parallel to the floor when sitting. DO YOUR BEST to sleep in the bed at night. DO NOT sleep in your recliner. Long hours of sitting in a recliner leads to swelling of the legs and/or potential wounds on your backside. Additional Orders / Instructions: Follow Nutritious Diet and Increase Protein Intake Medications-Please add to  medication list.: P.O. Antibiotics - continue as prescribed WOUND #1: - Lower Leg Wound Laterality: Left, Medial Cleanser: Wound Cleanser (Home Health) 3 x Per Week/30 Days Discharge Instructions: Wash your hands with soap and water. Remove old dressing, discard into plastic bag and place into trash. Cleanse the wound with Wound Cleanser prior to applying a clean dressing using gauze sponges, not tissues or cotton balls. Do not scrub or use excessive force. Pat dry using gauze sponges, not tissue or cotton balls. Peri-Wound Care: Nystatin Cream USP 30 (g) 3 x Per Week/30 Days Discharge Instructions: Use Nystatin Cream as directed. MIx 1:1 with TCA cream and applied to peri wound Peri-Wound Care: Triamcinolone Acetonide Cream, 0.1%, 15 (g) tube 3 x Per Week/30 Days Discharge Instructions: Apply as directed. mix 1:1 with nystatin cream for peri wound Primary Dressing: Cutimed Sorbact 1.5x 2.38 (in/in) 3 x Per Week/30 Days Discharge Instructions: A bacteria- and fungi binding wound dressing, suitable for cavities and fistulas. It is suitable as a wound filler and allows the passage of wound exudate into a secondary dressing. The dressing helps reducing odor and pain and can improve healing. Primary Dressing: keystone compound 3 x Per Week/30 Days Discharge Instructions: apply to wound bed as directed on medication label PLEASE follow direction on pill bottle for proper mixing Secondary Dressing: Zetuvit Absorbent Pad, 4x8 (in/in) 3 x Per Week/30 Days Laura Santiago, Laura Santiago. ([id number]) Compression Wrap: 3-LAYER WRAP - Profore Lite LF 3 Multilayer Compression Bandaging System 3 x Per Week/30 Days Discharge Instructions: Apply 3 multi-layer wrap as prescribed. 1. In office sharp debridement 2. Sorbact with Keystone antibiotic under 3 layer compression 3. Follow-up in 1 week Electronic Signature(s) Signed: 04/26/2022 12:38:43 PM By: Kalman Shan DO Entered By: Kalman Shan on 04/26/2022  12:28:09 Laura Santiago ([id number]) -------------------------------------------------------------------------------- SuperBill Details Patient Name: Laura Santiago. Date of Service: 04/26/2022 Medical  Record Number: [id number] Patient Account Number: [id number] Date of Birth/Sex: [birth year] (76 y.o. F) Treating RN: Levora Dredge Primary Care Provider: [facility], [place] Other Clinician: Referring Provider: [facility] Treating Provider/Extender: Yaakov Guthrie in Treatment: 42 Diagnosis Coding ICD-10 Codes Code Description [phone] 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 I87.2 Venous insufficiency (chronic) (peripheral) Z79.01 Long term (current) use of anticoagulants I10 Essential (primary) hypertension 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 Facility Procedures CPT4 Code: [id number] Description: 93790 - DEB SUBQ TISSUE 20 SQ CM/< Modifier: Quantity: 1 CPT4 Code: Description: ICD-10 Diagnosis Description L97.822 Non-pressure chronic ulcer of other part of left lower leg with fat layer Modifier: exposed Quantity: CPT4 Code: [id number] Description: 29924 - DEB SUBQ TISS EA ADDL 20CM Modifier: Quantity: 6 CPT4 Code: Description: ICD-10 Diagnosis Description L97.822 Non-pressure chronic ulcer of other part of left lower leg with fat layer Modifier: exposed Quantity: Physician Procedures CPT4 Code: [id number] Description: 11042 - WC PHYS SUBQ TISS 20 SQ CM Modifier: Quantity: 1 CPT4 Code: Description: ICD-10 Diagnosis Description L97.822 Non-pressure chronic ulcer of other part of left lower leg with fat layer Modifier: exposed Quantity: CPT4 Code: [id number] Description: 11045 - WC PHYS SUBQ TISS EA ADDL 20 CM Modifier: Quantity: 6 CPT4 Code: Description: ICD-10 Diagnosis Description L97.822 Non-pressure chronic ulcer of  other part of left lower leg with fat layer Modifier: exposed Quantity: Electronic Signature(s) Signed: 04/26/2022 12:38:43 PM By: Kalman Shan DO Entered By: Kalman Shan on 04/26/2022 89:21:19

## 2022-04-29 NOTE — Progress Notes (Signed)
DENITRA, DONAGHEY ([id number]) Visit Report for 04/26/2022 Arrival Information Details Patient Name: Laura Santiago, Laura Santiago. Date of Service: 04/26/2022 11:15 AM Medical Record Number: [id number] Patient Account Number: [id number] Date of Birth/Sex: [birth year] (76 y.o. F) Treating RN: Alycia Rossetti Primary Care Raihan Kimmel: [facility] Other Clinician: Referring Ipek Westra: [facility] Treating Laveyah Oriol/Extender: Yaakov Guthrie in Treatment: 42 Visit Information History Since Last Visit Added or deleted any medications: No Patient Arrived: Walker Any new allergies or adverse reactions: No Arrival Time: 11:34 Had a fall or experienced change in No Accompanied By: self activities of daily living that may affect Transfer Assistance: None risk of falls: Patient Identification Verified: Yes Hospitalized since last visit: No Secondary Verification Process Completed: Yes Has Dressing in Place as Prescribed: Yes Patient Requires Transmission-Based No Has Compression in Place as Prescribed: Yes Precautions: Pain Present Now: Yes Patient Has Alerts: Yes Patient Alerts: PT HAS SERVICE ANIMAL ABI 07/11/21 R) 1.16 L) 1.27 Electronic Signature(s) Signed: 04/28/2022 8:14:28 AM By: Alycia Rossetti Entered By: Alycia Rossetti on 04/26/2022 11:37:48 Laura Santiago ([id number]) -------------------------------------------------------------------------------- Clinic Level of Care Assessment Details Patient Name: Laura Santiago. Date of Service: 04/26/2022 11:15 AM Medical Record Number: [id number] Patient Account Number: [id number] Date of Birth/Sex: Jul 31, 1946 (76 y.o. F) Treating RN: Alycia Rossetti Primary Care Kapil Petropoulos: [facility] Other Clinician: Referring Coty Student: [facility] Treating Atiyana Welte/Extender: Yaakov Guthrie in Treatment: 42 Clinic Level of Care Assessment Items TOOL 1 Quantity Score _0  - Use when EandM and Procedure is performed on INITIAL  visit 0 ASSESSMENTS - Nursing Assessment / Reassessment _1  - General Physical Exam (combine w/ comprehensive assessment (listed just below) when performed on new 0 pt. evals) _2  - 0 Comprehensive Assessment (HX, ROS, Risk Assessments, Wounds Hx, etc.) ASSESSMENTS - Wound and Skin Assessment / Reassessment _3  - Dermatologic / Skin Assessment (not related to wound area) 0 ASSESSMENTS - Ostomy and/or Continence Assessment and Care _4  - Incontinence Assessment and Management 0 _5  - 0 Ostomy Care Assessment and Management (repouching, etc.) PROCESS - Coordination of Care _6  - Simple Patient / Family Education for ongoing care 0 _7  - 0 Complex (extensive) Patient / Family Education for ongoing care _8  - 0 Staff obtains Programmer, systems, Records, Test Results / Process Orders _9  - 0 Staff telephones HHA, Nursing Homes / Clarify orders / etc _10  - 0 Routine Transfer to another Facility (non-emergent condition) _11  - 0 Routine Hospital Admission (non-emergent condition) _12  - 0 New Admissions / Biomedical engineer / Ordering NPWT, Apligraf, etc. _13  - 0 Emergency Hospital Admission (emergent condition) PROCESS - Special Needs _14  - Pediatric / Minor Patient Management 0 _15  - 0 Isolation Patient Management _16  - 0 Hearing / Language / Visual special needs _17  - 0 Assessment of Community assistance (transportation, D/C planning, etc.) _18  - 0 Additional assistance / Altered mentation _19  - 0 Support Surface(s) Assessment (bed, cushion, seat, etc.) INTERVENTIONS - Miscellaneous _20  - External ear exam 0 _21  - 0 Patient Transfer (multiple staff / Civil Service fast streamer / Similar devices) _22  - 0 Simple Staple / Suture removal (25 or less) _23  - 0 Complex Staple / Suture removal (26 or more) _24  - 0 Hypo/Hyperglycemic Management (do not check if billed separately) _25  - 0 Ankle / Brachial Index (ABI) - do not check if billed separately Has the patient been seen at the hospital within the last three years:  Yes Total Score: 0 Level Of Care: ____ Laura Santiago ([id number]) Electronic Signature(s) Signed: 04/28/2022 8:14:28 AM By:  Alycia Rossetti Entered By: Alycia Rossetti on 04/26/2022 12:25:55 Laura Santiago ([id number]) -------------------------------------------------------------------------------- Lower Extremity Assessment Details Patient Name: Laura Santiago. Date of Service: 04/26/2022 11:15 AM Medical Record Number: [id number] Patient Account Number: [id number] Date of Birth/Sex: 05/29/1946 (76 y.o. F) Treating RN: Alycia Rossetti Primary Care Sadira Standard: [facility] Other Clinician: Referring Haru Anspaugh: [facility] Treating Saber Dickerman/Extender: Yaakov Guthrie in Treatment: 42 Edema Assessment Assessed: [Left: No] [Right: No] Edema: [Left: Ye] [Right: s] Calf Left: Right: Point of Measurement: 29 cm From Medial Instep 33.5 cm Ankle Left: Right: Point of Measurement: 10 cm From Medial Instep 25.6 cm Vascular Assessment Pulses: Dorsalis Pedis Palpable: [Left:Yes] Electronic Signature(s) Signed: 04/28/2022 8:14:28 AM By: Alycia Rossetti Entered By: Alycia Rossetti on 04/26/2022 11:43:57 Laura Santiago ([id number]) -------------------------------------------------------------------------------- Multi Wound Chart Details Patient Name: Laura Santiago. Date of Service: 04/26/2022 11:15 AM Medical Record Number: [id number] Patient Account Number: [id number] Date of Birth/Sex: 1946/06/28 (76 y.o. F) Treating RN: Alycia Rossetti Primary Care Jashawna Reever: [facility] Other Clinician: Referring Teila Skalsky: [facility] Treating Clee Pandit/Extender: Yaakov Guthrie in Treatment: 42 Vital Signs Height(in): 66 Pulse(bpm): 33 Weight(lbs): 153 Blood Pressure(mmHg): 154/78 Body Mass Index(BMI): 24.7 Temperature(F): 98.3 Respiratory Rate(breaths/min): 18 Photos: [N/A:N/A] Wound Location: Left, Medial Lower Leg N/A N/A Wounding Event: Gradually  Appeared N/A N/A Primary Etiology: Venous Leg Ulcer N/A N/A Comorbid History: Hypertension, Osteoarthritis, N/A N/A Received Chemotherapy, Received Radiation Date Acquired: 04/06/2021 N/A N/A Weeks of Treatment: 42 N/A N/A Wound Status: Open N/A N/A Wound Recurrence: No N/A N/A Clustered Wound: Yes N/A N/A Measurements L x W x D (cm) 10.6x12.8x0.2 N/A N/A Area (cm) : 106.563 N/A N/A Volume (cm) : 21.313 N/A N/A % Reduction in Area: -18.00% N/A N/A % Reduction in Volume: -18.00% N/A N/A Classification: Full Thickness Without Exposed N/A N/A Support Structures Exudate Amount: Large N/A N/A Exudate Type: Serosanguineous N/A N/A Exudate Color: red, brown N/A N/A Wound Margin: Flat and Intact N/A N/A Granulation Amount: Medium (34-66%) N/A N/A Granulation Quality: Red, Pink N/A N/A Necrotic Amount: Medium (34-66%) N/A N/A Exposed Structures: Fat Layer (Subcutaneous Tissue): N/A N/A Yes Fascia: No Tendon: No Muscle: No Joint: No Bone: No Epithelialization: Small (1-33%) N/A N/A Treatment Notes Electronic Signature(s) Signed: 04/28/2022 8:14:28 AM By: Alycia Rossetti Entered By: Alycia Rossetti on 04/26/2022 11:45:25 Laura Santiago ([id number]) Quentin Ore, Ralene Muskrat ([id number]) -------------------------------------------------------------------------------- [place] Details Patient Name: TASMINE, HIPWELL. Date of Service: 04/26/2022 11:15 AM Medical Record Number: [id number] Patient Account Number: [id number] Date of Birth/Sex: 11/15/1946 (76 y.o. F) Treating RN: Alycia Rossetti Primary Care Jing Howatt: [facility] Other Clinician: Referring Nile Dorning: [facility] Treating Amey Hossain/Extender: Yaakov Guthrie in Treatment: 42 Active Inactive Soft Tissue Infection Nursing Diagnoses: Impaired tissue integrity Potential for infection: soft tissue Goals: Patient's soft tissue infection will resolve Date Initiated: 03/15/2022 Target Resolution  Date: 04/27/2022 Goal Status: Active Signs and symptoms of infection will be recognized early to allow for prompt treatment Date Initiated: 03/15/2022 Date Inactivated: 04/26/2022 Target Resolution Date: 04/27/2022 Goal Status: Met Interventions: Assess signs and symptoms of infection every visit Treatment Activities: Culture and sensitivity : 03/15/2022 Notes: Wound/Skin Impairment Nursing Diagnoses: Impaired tissue integrity Goals: Patient/caregiver will verbalize understanding of skin care regimen Date Initiated: 07/06/2021 Date Inactivated: 07/27/2021 Target Resolution Date: 07/06/2021 Goal Status: Met Ulcer/skin breakdown will have a volume reduction of 30% by week 4 Date Initiated: 07/06/2021 Date Inactivated: 10/12/2021 Target Resolution Date: 08/06/2021 Goal Status: Unmet Unmet Reason: cont tx Ulcer/skin breakdown will  have a volume reduction of 50% by week 8 Date Initiated: 07/06/2021 Target Resolution Date: 09/06/2021 Goal Status: Active Ulcer/skin breakdown will have a volume reduction of 80% by week 12 Date Initiated: 07/06/2021 Target Resolution Date: 10/06/2021 Goal Status: Active Ulcer/skin breakdown will heal within 14 weeks Date Initiated: 07/06/2021 Target Resolution Date: 11/06/2021 Goal Status: Active Interventions: Assess patient/caregiver ability to obtain necessary supplies Assess patient/caregiver ability to perform ulcer/skin care regimen upon admission and as needed Assess ulceration(s) every visit Treatment Activities: Referred to DME Dorothymae Maciver for dressing supplies : 07/06/2021 Skin care regimen initiated : 07/06/2021 Notes: JACI, DESANTO ([id number]) Electronic Signature(s) Signed: 04/28/2022 8:14:28 AM By: Alycia Rossetti Entered By: Alycia Rossetti on 04/26/2022 11:45:06 Laura Santiago ([id number]) -------------------------------------------------------------------------------- Pain Assessment Details Patient Name: Laura Santiago. Date of Service:  04/26/2022 11:15 AM Medical Record Number: [id number] Patient Account Number: [id number] Date of Birth/Sex: Sep 29, 1946 (76 y.o. F) Treating RN: Alycia Rossetti Primary Care Jerolene Kupfer: [facility] Other Clinician: Referring Susie Pousson: [facility] Treating Talullah Abate/Extender: Yaakov Guthrie in Treatment: 42 Active Problems Location of Pain Severity and Description of Pain Patient Has Paino Yes Site Locations Rate the pain. Current Pain Level: 7 Pain Management and Medication Current Pain Management: Goals for Pain Management Patient states pain 7/10 at wound site. Electronic Signature(s) Signed: 04/28/2022 8:14:28 AM By: Alycia Rossetti Entered By: Alycia Rossetti on 04/26/2022 11:38:55 Laura Santiago ([id number]) -------------------------------------------------------------------------------- Patient/Caregiver Education Details Patient Name: Laura Santiago. Date of Service: 04/26/2022 11:15 AM Medical Record Number: [id number] Patient Account Number: [id number] Date of Birth/Gender: 06/11/1946 (76 y.o. F) Treating RN: Alycia Rossetti Primary Care Physician: [facility] Other Clinician: Referring Physician: [facility] Treating Physician/Extender: Yaakov Guthrie in Treatment: 46 Education Assessment Education Provided To: Patient Education Topics Provided Wound Debridement: Handouts: Wound Debridement Methods: Demonstration, Explain/Verbal Responses: State content correctly Wound/Skin Impairment: Handouts: Caring for Your Ulcer Methods: Explain/Verbal Responses: State content correctly Electronic Signature(s) Signed: 04/28/2022 8:14:28 AM By: Alycia Rossetti Entered By: Alycia Rossetti on 04/26/2022 12:26:19 Laura Santiago ([id number]) -------------------------------------------------------------------------------- Wound Assessment Details Patient Name: Laura Santiago. Date of Service: 04/26/2022 11:15 AM Medical Record Number:  [id number] Patient Account Number: [id number] Date of Birth/Sex: 1946/11/28 (76 y.o. F) Treating RN: Alycia Rossetti Primary Care Cherilyn Sautter: [facility] Other Clinician: Referring Salome Hautala: [facility] Treating Torian Thoennes/Extender: Yaakov Guthrie in Treatment: 42 Wound Status Wound Number: 1 Primary Venous Leg Ulcer Etiology: Wound Location: Left, Medial Lower Leg Wound Status: Open Wounding Event: Gradually Appeared Comorbid Hypertension, Osteoarthritis, Received Chemotherapy, Date Acquired: 04/06/2021 History: Received Radiation Weeks Of Treatment: 42 Clustered Wound: Yes Photos Wound Measurements Length: (cm) 10.6 Width: (cm) 12.8 Depth: (cm) 0.2 Area: (cm) 106.563 Volume: (cm) 21.313 % Reduction in Area: -18% % Reduction in Volume: -18% Epithelialization: Small (1-33%) Tunneling: No Undermining: No Wound Description Classification: Full Thickness Without Exposed Support Structu Wound Margin: Flat and Intact Exudate Amount: Large Exudate Type: Serosanguineous Exudate Color: red, brown res Foul Odor After Cleansing: No Slough/Fibrino Yes Wound Bed Granulation Amount: Medium (34-66%) Exposed Structure Granulation Quality: Red, Pink Fascia Exposed: No Necrotic Amount: Medium (34-66%) Fat Layer (Subcutaneous Tissue) Exposed: Yes Necrotic Quality: Adherent Slough Tendon Exposed: No Muscle Exposed: No Joint Exposed: No Bone Exposed: No Electronic Signature(s) Signed: 04/28/2022 8:14:28 AM By: Alycia Rossetti Entered By: Alycia Rossetti on 04/26/2022 11:42:03 Laura Santiago ([id number]) -------------------------------------------------------------------------------- Vitals Details Patient Name: Laura Santiago. Date of Service: 04/26/2022 11:15 AM Medical Record Number: [id number] Patient Account Number: [id number] Date of  Birth/Sex: [birth year] (76 y.o. F) Treating RN: Alycia Rossetti Primary Care Juelz Whittenberg: [facility] Other  Clinician: Referring Arielys Wandersee: [facility] Treating Demontray Franta/Extender: Yaakov Guthrie in Treatment: 54 Vital Signs Time Taken: 11:37 Temperature (F): 98.3 Height (in): 66 Pulse (bpm): 86 Weight (lbs): 153 Respiratory Rate (breaths/min): 18 Body Mass Index (BMI): 24.7 Blood Pressure (mmHg): 154/78 Reference Range: 80 - 120 mg / dl Electronic Signature(s) Signed: 04/28/2022 8:14:28 AM By: Alycia Rossetti Entered By: Alycia Rossetti on 04/26/2022 11:37:30

## 2022-05-02 ENCOUNTER — Encounter: Payer: Self-pay | Admitting: *Deleted

## 2022-05-02 ENCOUNTER — Other Ambulatory Visit: Payer: Self-pay | Admitting: *Deleted

## 2022-05-02 ENCOUNTER — Ambulatory Visit
Admission: RE | Admit: 2022-05-02 | Discharge: 2022-05-02 | Disposition: A | Discharge status: Home / Self Care | Payer: Medicare Other | Source: Ambulatory Visit | Attending: Oncology | Admitting: Oncology

## 2022-05-02 DIAGNOSIS — Z86718 Personal history of other venous thrombosis and embolism: Secondary | ICD-10-CM | POA: Insufficient documentation

## 2022-05-02 DIAGNOSIS — C50919 Malignant neoplasm of unspecified site of unspecified female breast: Secondary | ICD-10-CM | POA: Insufficient documentation

## 2022-05-02 DIAGNOSIS — I080 Rheumatic disorders of both mitral and aortic valves: Secondary | ICD-10-CM | POA: Diagnosis not present

## 2022-05-02 DIAGNOSIS — I10 Essential (primary) hypertension: Secondary | ICD-10-CM | POA: Diagnosis not present

## 2022-05-02 DIAGNOSIS — Z5112 Encounter for antineoplastic immunotherapy: Secondary | ICD-10-CM | POA: Insufficient documentation

## 2022-05-02 LAB — ECHOCARDIOGRAM COMPLETE
AR max vel: 2.63 cm2
AV Area VTI: 3.06 cm2
AV Area mean vel: 2.62 cm2
AV Mean grad: 4 mmHg
AV Peak grad: 6.8 mmHg
Ao pk vel: 1.3 m/s
Area-P 1/2: 3.53 cm2
MV VTI: 2.06 cm2
S' Lateral: 3.4 cm

## 2022-05-02 MED ORDER — LORAZEPAM 0.5 MG PO TABS: 0.5000 mg | ORAL_TABLET | Freq: Every day | ORAL | 0 refills | Status: DC | PRN

## 2022-05-02 NOTE — Telephone Encounter (Signed)
Daughter called and said that someone told her someone here at the cancer center that she did not need the Ativan anymore and she could throw it away.  Dr. Janese Banks was concerned because that is a controlled medicine.  Laura Santiago her daughter says that she was told not to do that again but she does have days where she has anxiety and the lorazepam helps so she did want to have a new refill.  Checked in the last time that in the computer was in 2022 so Dr. Janese Banks says that she will give her 0.5 mg daily as needed 30 pills.  I will send a MyChart message to her daughter to let her know

## 2022-05-02 NOTE — Progress Notes (Signed)
*  PRELIMINARY RESULTS* Echocardiogram 2D Echocardiogram has been performed.  Laura Santiago 05/02/2022, 10:32 AM

## 2022-05-03 ENCOUNTER — Encounter (HOSPITAL_BASED_OUTPATIENT_CLINIC_OR_DEPARTMENT_OTHER): Payer: Medicare Other | Admitting: Internal Medicine

## 2022-05-03 DIAGNOSIS — L97822 Non-pressure chronic ulcer of other part of left lower leg with fat layer exposed: Secondary | ICD-10-CM

## 2022-05-03 DIAGNOSIS — I87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: Secondary | ICD-10-CM | POA: Diagnosis not present

## 2022-05-03 NOTE — Progress Notes (Signed)
SAVANAH, BAYLES ([id number]) Visit Report for 05/03/2022 Chief Complaint Document Details Patient Name: Laura Santiago, Laura Santiago. Date of Service: 05/03/2022 9:45 AM Medical Record Number: [id number] Patient Account Number: [id number] Date of Birth/Sex: 1946/09/16 (76 y.o. F) Treating RN: Cornell Barman Primary Care Provider: Hilda Lias, [place] Other Clinician: [name] Referring Provider: [facility] Treating Provider/Extender: Yaakov Guthrie in Treatment: 31 Information Obtained from: Patient Chief Complaint Left lower extremity wound Right toe wounds Left upper lateral thigh wounds Electronic Signature(s) Signed: 05/03/2022 12:13:54 PM By: Kalman Shan DO Entered By: Kalman Shan on 05/03/2022 12:05:24 Laura Santiago ([id number]) -------------------------------------------------------------------------------- Debridement Details Patient Name: Laura Santiago. Date of Service: 05/03/2022 9:45 AM Medical Record Number: [id number] Patient Account Number: [id number] Date of Birth/Sex: [birth year] (76 y.o. F) Treating RN: Alycia Rossetti Primary Care Provider: Hilda Lias, [place] Other Clinician: [name] Referring Provider: [facility], [facility] Treating Provider/Extender: Yaakov Guthrie in Treatment: 43 Debridement Performed for Wound #1 Left,Medial Lower Leg Assessment: Performed By: Physician Kalman Shan, MD Debridement Type: Debridement Severity of Tissue Pre Debridement: Fat layer exposed Level of Consciousness (Pre- Awake and Alert procedure): Pre-procedure Verification/Time Out Yes - 11:10 Taken: Start Time: 11:10 Pain Control: Lidocaine 4% Topical Solution Total Area Debrided (L x W): 10 (cm) x 13 (cm) = 130 (cm) Tissue and other material Viable, Non-Viable, Slough, Subcutaneous, Skin: Epidermis, Biofilm, Slough debrided: Level: Skin/Subcutaneous Tissue Debridement Description: Excisional Instrument: Curette Bleeding:  Moderate Hemostasis Achieved: Pressure End Time: 11:20 Procedural Pain: 0 Post Procedural Pain: 0 Response to Treatment: Procedure was tolerated well Level of Consciousness (Post- Awake and Alert procedure): Post Debridement Measurements of Total Wound Length: (cm) 10.3 Width: (cm) 13.4 Depth: (cm) 0.2 Volume: (cm) 21.68 Character of Wound/Ulcer Post Debridement: Improved Severity of Tissue Post Debridement: Fat layer exposed Post Procedure Diagnosis Same as Pre-procedure Electronic Signature(s) Signed: 05/03/2022 12:13:54 PM By: Kalman Shan DO Signed: 05/03/2022 4:54:43 PM By: Alycia Rossetti Entered By: Alycia Rossetti on 05/03/2022 11:19:00 Laura Santiago ([id number]) -------------------------------------------------------------------------------- HPI Details Patient Name: Laura Santiago. Date of Service: 05/03/2022 9:45 AM Medical Record Number: [id number] Patient Account Number: [id number] Date of Birth/Sex: June 08, 1946 (76 y.o. F) Treating RN: Cornell Barman Primary Care Provider: Hilda Lias, [place] Other Clinician: [name] Referring Provider: [facility] Treating Provider/Extender: Yaakov Guthrie in Treatment: 36 History of Present Illness HPI Description: Admission 7/27 Laura Santiago is a 76 year old female with a past medical history of ADHD, metastatic breast cancer, stage IV chronic kidney disease, history of DVT on Xarelto and chronic venous insufficiency that presents to the clinic for a chronic left lower extremity wound. She recently moved to [facility] 4 days ago. She was being followed by wound care center in [name]. She reports a 10-year history of wounds to her left lower extremity that eventually do heal with debridement and compression therapy. She states that the current wound reopened 4 months ago and she is using Vaseline and Coban. She denies signs of infection. 8/3; patient presents for 1 week follow-up. She  reports no issues or complaints today. She states she had vascular studies done in the last week. She denies signs of infection. She brought her little service dog with her today. 8/17; patient presents for follow-up. She has missed her last clinic appointment. She states she took the wrap off and attempted to rewrap her leg. She is having difficulty with transportation. She has her service dog with her today. Overall she feels well and reports improvement in wound  healing. She denies signs of infection. She reports owning an old Velcro wrap compression and has this at her living facility 9/14; patient presents for follow-up. Patient states that over the past 2 to 3 weeks she developed toe wounds to her right foot. She attributes this to tight fitting shoes. She subsequently developed cellulitis in the right leg and has been treated by doxycycline by her oncologist. She reports improvement in symptoms however continues to have some redness and swelling to this leg. To the left lower extremity patient has been having her wraps changed with home health twice weekly. She states that the [facility] is not helping control the drainage. Other than that she has no issues or complaints today. She denies signs of infection to the left lower extremity. 9/21; patient presents for follow-up. She reports seeing infectious disease for her cellulitis. She reports no further management. She has home health that changes the wraps twice weekly. She has no issues or complaints today. She denies signs of infection. 10/5; patient presents for follow-up. She has no issues or complaints today. She denies signs of infection. She states that the right great toe has not been dressed by home health. 10/12; patient presents for follow-up. She has no issues or complaints today. She reports improvement in her wound healing. She has been using silver alginate to the right great toe wound. She denies signs of infection. 10/26;  patient presents for follow-up. Home health did not have sorbact so they continued to use Hydrofera Blue under the wrap. She has been using silver alginate to the great toe wound however she did not have a dressing in place today. She currently denies signs of infection. 11/2; patient presents for follow-up. She has been using sorb act under the compression wrap. She reports using silver alginate to the toe wound again she does not have a dressing in place. She currently denies signs of infection. 11/23; patient presents for follow-up. Unfortunately she has missed her last 2 clinic appointments. She was last seen 3 weeks ago. She did her own compression wrap with Kerlix and Coban yesterday after seeing vein and vascular. She has not been dressing her right great toe wound. She currently denies signs of infection. 11/30; patient presents for 1 week follow-up. She states she changed her dressing last week prior to home health and use sorb act with Dakin's and Hydrofera Blue. Home health has changed the dressing as well and they have been using sorbact. Today she reports increased redness to her right lower extremity. She has a history of cellulitis to this leg. She has been using silver alginate to the right great toe. Unfortunately she had an episode of diarrhea prior to coming in and had feces all over the right leg and to the wrap of her left leg. 12/7; patient presents for 1 week follow-up. She states that home health did not come out to change the dressing and she took it off yesterday. It is unclear if she is dressing the right toe wound. She denies signs of infection. 12/14; patient presents for 1 week follow-up. She has no issues or complaints today. 12/21; patient presents for follow-up. She has no issues or complaints today. She denies signs of infection. 12/28/2021; patient presents for follow-up. She was hospitalized for sepsis secondary to right lower extremity cellulitis On 12/23. She  states she is currently at a SNF. She states that she was started on doxycycline this morning for her right great toe swelling and redness. She is  not sure what dressings have been done to her left lower extremity for the past 3 weeks. She says its been mainly gauze with an Ace wrap. 1/25; patient presents for follow-up. She is still residing in a skilled nursing facility. She reports mild pain to the left lower extremity wound bed. She states she is going to see a podiatrist soon. 2/8; patient presents for follow-up. She has moved back to her residential community from her skilled nursing facility. She has no issues or complaints today. She denies signs of systemic infections. 2/15; patient presents for follow-up. He has no issues or complaints today. She denies systemic signs of infection. 2/22; patient presents for follow-up. She has no issues or complaints today. She denies signs of infection. Laura Santiago, Laura Santiago ([id number]) 3/1; patient presents for follow-up. She states that home health came out the day after she was seen in our clinic and yesterday to do the wrap change. She denies signs of infection. She reports excoriated skin on the ankle. 3/8; patient presents for follow-up. She has no issues or complaints today. She denies signs of infection. 3/15; patient presents for follow-up. Home health has been coming out to change the dressings. She reports more tenderness to the wound site. She denies purulent drainage, increased warmth or erythema to the area. 4/5; patient presents for follow-up. She has missed her last 2 clinic appointments. I have not seen her in 3 weeks. She was recently hospitalized for altered mental status. She was involuntarily committed. She was evaluated by psychiatry and deemed to have competency. There was no specific cause of her altered mental status. It was concluded that her physical and mental health were declining due to her chronic medical conditions. Currently  home health has been coming out for dressing changes. Patient has also been doing her own dressing changes. She reports more skin breakdown to the periwound and now has a new wound. She denies fever/chills. She reports continued tenderness to the wound site. 4/12; patient with significant venous insufficiency and a large wound on her left lower leg taking up about 80% of the circumference of her lower leg. Cultures of this grew MRSA and Pseudomonas. She had completed a course of ciprofloxacin now is starting doxycycline. She has been using Dakin's wet-to-dry and a Tubigrip. She has home health twice a week and we change it once. 4/19; patient presents for follow-up. She completed her course of doxycycline. She has been using Dakin's wet-to-dry dressing and Tubigrip. Home health changes the dressing twice weekly. Currently she has no issues or complaints. 4/26; patient presents for follow-up. At last clinic visit orders for home health were Iodosorb under compression therapy. Unfortunately they did not have the dressing and have been using Dakin's and gentamicin under the wrap. Patient currently denies signs of infection. She has no issues or complaints today. 5/3; patient presents for follow-up. Again Iodosorb has not been used under the compression therapy when home health comes out to change the wrap and dressing. They have been using Sorbact. It is unclear why this is happening since we send orders weekly to the agency. She denies signs of infection. Patient has not purchased the [place] antibiotics. We reached out to the company and they said they have been trying to contact her on a regular basis. We gave the patient the number to call to order the medication. 5/10; patient presents for follow-up. She has no issues or complaints today. Again home health has not been using Iodosorb. Mepilex was on the wound  bed. No other dressings noted. She brought in her Keystone antibiotics. She denies signs  of infection. 5/17; patient presents for follow-up. Home health has come out twice since she was last seen. Joint well she has been using Keystone antibiotic with Sorbact under the compression wrap. She has no issues or complaints today. She denies signs of infection. 5/24; patient presents for follow-up. We have been using Keystone antibiotics with Sorbact under compression therapy. She is tolerating the treatment well. She is reporting improvement in wound healing. She denies signs of infection. Electronic Signature(s) Signed: 05/03/2022 12:13:54 PM By: Kalman Shan DO Entered By: Kalman Shan on 05/03/2022 12:10:47 Laura Santiago ([id number]) -------------------------------------------------------------------------------- Physical Exam Details Patient Name: Laura Santiago, Laura Santiago. Date of Service: 05/03/2022 9:45 AM Medical Record Number: [id number] Patient Account Number: [id number] Date of Birth/Sex: 1946/06/13 (76 y.o. F) Treating RN: Cornell Barman Primary Care Provider: Hilda Lias, [place] Other Clinician: [name] Referring Provider: [facility] Treating Provider/Extender: Yaakov Guthrie in Treatment: 33 Constitutional . Cardiovascular . Psychiatric . Notes Left lower extremity: Her previous large wound has been divided by islands of epithelization and now she has 3 wounds. These all contain granulation tissue, slough and biofilm. No surrounding signs of infection. Electronic Signature(s) Signed: 05/03/2022 12:13:54 PM By: Kalman Shan DO Entered By: Kalman Shan on 05/03/2022 12:11:25 Laura Santiago ([id number]) -------------------------------------------------------------------------------- Physician Orders Details Patient Name: Laura Santiago. Date of Service: 05/03/2022 9:45 AM Medical Record Number: [id number] Patient Account Number: [id number] Date of Birth/Sex: [birth year] (76 y.o. F) Treating RN: Alycia Rossetti Primary Care  Provider: Hilda Lias, [place] Other Clinician: [name] Referring Provider: [facility] Treating Provider/Extender: Yaakov Guthrie in Treatment: 39 Verbal / Phone Orders: No Diagnosis Coding Follow-up Appointments o Return Appointment in 1 week. o Nurse Visit as needed [place] for wound care. May utilize formulary equivalent dressing for wound treatment orders unless otherwise specified. Home Health Nurse may visit PRN to address patientos wound care needs. o **Please direct any NON-WOUND related issues/requests for orders to patient's Primary Care Physician. **If current dressing causes regression in wound condition, may D/C ordered dressing product/s and apply Normal Saline Moist Dressing daily until next [place] or Other MD appointment. **Notify Wound Healing Center of regression in wound condition at [phone]. o Other Home Health Orders/Instructions: - Dressing change 3 x weekly, once by wound care and once at wound clinic weekly. PLEASE make sure frequency between dressing changes is appropriate. Bathing/ Shower/ Hygiene o May shower with wound dressing protected with water repellent cover or cast protector. o No tub bath. Anesthetic (Use 'Patient Medications' Section for Anesthetic Order Entry) o Lidocaine applied to wound bed Edema Control - Lymphedema / Segmental Compressive Device / Other o Optional: One layer of unna paste to top of compression wrap (to act as an anchor). - PLEASE when applying wrap start from toes and go up to just below knee o Elevate, Exercise Daily and Avoid Standing for Long Periods of Time. o Elevate legs to the level of the heart and pump ankles as often as possible o Elevate leg(s) parallel to the floor when sitting. o DO YOUR BEST to sleep in the bed at night. DO NOT sleep in your recliner. Long hours of sitting in a  recliner leads to swelling of the legs and/or potential wounds on your backside. Additional Orders / Instructions o Follow Nutritious Diet and Increase Protein Intake Medications-Please add to  medication list. o P.O. Antibiotics - continue as prescribed Wound Treatment Wound #1 - Lower Leg Wound Laterality: Left, Medial Cleanser: Wound Cleanser (Home Health) 3 x Per Week/30 Days Discharge Instructions: Wash your hands with soap and water. Remove old dressing, discard into plastic bag and place into trash. Cleanse the wound with Wound Cleanser prior to applying a clean dressing using gauze sponges, not tissues or cotton balls. Do not scrub or use excessive force. Pat dry using gauze sponges, not tissue or cotton balls. Peri-Wound Care: Nystatin Cream USP 30 (g) 3 x Per Week/30 Days Discharge Instructions: Use Nystatin Cream as directed. MIx 1:1 with TCA cream and applied to peri wound Peri-Wound Care: Triamcinolone Acetonide Cream, 0.1%, 15 (g) tube 3 x Per Week/30 Days Discharge Instructions: Apply as directed. mix 1:1 with nystatin cream for peri wound Primary Dressing: Cutimed Sorbact 1.5x 2.38 (in/in) 3 x Per Week/30 Days Discharge Instructions: A bacteria- and fungi binding wound dressing, suitable for cavities and fistulas. It is suitable as a wound filler and allows the passage of wound exudate into a secondary dressing. The dressing helps reducing odor and pain and can improve healing. Primary Dressing: keystone compound 3 x Per Week/30 Days Discharge Instructions: apply to wound bed as directed on medication label PLEASE follow direction on pill bottle for proper mixing Laura Santiago, Laura Santiago ([id number]) Secondary Dressing: Zetuvit Absorbent Pad, 4x8 (in/in) 3 x Per Week/30 Days Compression Wrap: 3-LAYER WRAP - Profore Lite LF 3 Multilayer Compression Bandaging System 3 x Per Week/30 Days Discharge Instructions: Apply 3 multi-layer wrap as prescribed. Electronic  Signature(s) Signed: 05/03/2022 12:13:54 PM By: Kalman Shan DO Entered By: Kalman Shan on 05/03/2022 12:13:28 Laura Santiago ([id number]) -------------------------------------------------------------------------------- Problem List Details Patient Name: Laura Santiago, Laura Santiago. Date of Service: 05/03/2022 9:45 AM Medical Record Number: [id number] Patient Account Number: [id number] Date of Birth/Sex: [birth year] (76 y.o. F) Treating RN: Cornell Barman Primary Care Provider: Hilda Lias, [place] Other Clinician: [name] Referring Provider: [facility] Treating Provider/Extender: Yaakov Guthrie in Treatment: 71 Active Problems ICD-10 Encounter Code Description Active Date MDM Diagnosis L97.822 Non-pressure chronic ulcer of other part of left lower leg with fat layer 11/02/2021 No Yes exposed I87.312 Chronic venous hypertension (idiopathic) with ulcer of left lower 11/02/2021 No Yes extremity I87.2 Venous insufficiency (chronic) (peripheral) 07/06/2021 No Yes Z79.01 Long term (current) use of anticoagulants 07/06/2021 No Yes I10 Essential (primary) hypertension 07/06/2021 No Yes 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 07/06/2021 No Yes Inactive Problems ICD-10 Code Description Active Date Inactive Date S81.802A Unspecified open wound, left lower leg, initial encounter 07/06/2021 07/06/2021 S91.101A Unspecified open wound of right great toe without damage to nail, initial 08/24/2021 08/24/2021 encounter S91.104A Unspecified open wound of right lesser toe(s) without damage to nail, initial 08/24/2021 08/24/2021 encounter Resolved Problems ICD-10 Code Description Active Date Resolved Date S91.104D Unspecified open wound of right lesser toe(s) without damage to nail, 08/31/2021 08/31/2021 subsequent encounter Laura Santiago, Laura Santiago ([id number]) S91.201D Unspecified open wound of right great toe with damage to nail, subsequent 08/31/2021 08/31/2021 encounter Electronic  Signature(s) Signed: 05/03/2022 12:13:54 PM By: Kalman Shan DO Entered By: Kalman Shan on 05/03/2022 12:05:19 Laura Santiago ([id number]) -------------------------------------------------------------------------------- Progress Note Details Patient Name: Laura Santiago. Date of Service: 05/03/2022 9:45 AM Medical Record Number: [id number] Patient Account Number: [id number] Date of Birth/Sex: [birth year] (76 y.o. F) Treating RN: Cornell Barman Primary Care Provider: Hilda Lias, [place] Other Clinician: [name] Referring Provider: [facility] Treating Provider/Extender: Yaakov Guthrie in Treatment: 37  Subjective Chief Complaint Information obtained from Patient Left lower extremity wound Right toe wounds Left upper lateral thigh wounds History of Present Illness (HPI) Admission 7/27 Ms. Bryli Mantey is a 76 year old female with a past medical history of ADHD, metastatic breast cancer, stage IV chronic kidney disease, history of DVT on Xarelto and chronic venous insufficiency that presents to the clinic for a chronic left lower extremity wound. She recently moved to [facility] 4 days ago. She was being followed by wound care center in [name]. She reports a 10-year history of wounds to her left lower extremity that eventually do heal with debridement and compression therapy. She states that the current wound reopened 4 months ago and she is using Vaseline and Coban. She denies signs of infection. 8/3; patient presents for 1 week follow-up. She reports no issues or complaints today. She states she had vascular studies done in the last week. She denies signs of infection. She brought her little service dog with her today. 8/17; patient presents for follow-up. She has missed her last clinic appointment. She states she took the wrap off and attempted to rewrap her leg. She is having difficulty with transportation. She has her service dog with her  today. Overall she feels well and reports improvement in wound healing. She denies signs of infection. She reports owning an old Velcro wrap compression and has this at her living facility 9/14; patient presents for follow-up. Patient states that over the past 2 to 3 weeks she developed toe wounds to her right foot. She attributes this to tight fitting shoes. She subsequently developed cellulitis in the right leg and has been treated by doxycycline by her oncologist. She reports improvement in symptoms however continues to have some redness and swelling to this leg. To the left lower extremity patient has been having her wraps changed with home health twice weekly. She states that the [facility] is not helping control the drainage. Other than that she has no issues or complaints today. She denies signs of infection to the left lower extremity. 9/21; patient presents for follow-up. She reports seeing infectious disease for her cellulitis. She reports no further management. She has home health that changes the wraps twice weekly. She has no issues or complaints today. She denies signs of infection. 10/5; patient presents for follow-up. She has no issues or complaints today. She denies signs of infection. She states that the right great toe has not been dressed by home health. 10/12; patient presents for follow-up. She has no issues or complaints today. She reports improvement in her wound healing. She has been using silver alginate to the right great toe wound. She denies signs of infection. 10/26; patient presents for follow-up. Home health did not have sorbact so they continued to use Hydrofera Blue under the wrap. She has been using silver alginate to the great toe wound however she did not have a dressing in place today. She currently denies signs of infection. 11/2; patient presents for follow-up. She has been using sorb act under the compression wrap. She reports using silver alginate to the  toe wound again she does not have a dressing in place. She currently denies signs of infection. 11/23; patient presents for follow-up. Unfortunately she has missed her last 2 clinic appointments. She was last seen 3 weeks ago. She did her own compression wrap with Kerlix and Coban yesterday after seeing vein and vascular. She has not been dressing her right great toe wound. She currently denies signs of  infection. 11/30; patient presents for 1 week follow-up. She states she changed her dressing last week prior to home health and use sorb act with Dakin's and Hydrofera Blue. Home health has changed the dressing as well and they have been using sorbact. Today she reports increased redness to her right lower extremity. She has a history of cellulitis to this leg. She has been using silver alginate to the right great toe. Unfortunately she had an episode of diarrhea prior to coming in and had feces all over the right leg and to the wrap of her left leg. 12/7; patient presents for 1 week follow-up. She states that home health did not come out to change the dressing and she took it off yesterday. It is unclear if she is dressing the right toe wound. She denies signs of infection. 12/14; patient presents for 1 week follow-up. She has no issues or complaints today. 12/21; patient presents for follow-up. She has no issues or complaints today. She denies signs of infection. 12/28/2021; patient presents for follow-up. She was hospitalized for sepsis secondary to right lower extremity cellulitis On 12/23. She states she is currently at a SNF. She states that she was started on doxycycline this morning for her right great toe swelling and redness. She is not sure what dressings have been done to her left lower extremity for the past 3 weeks. She says its been mainly gauze with an Ace wrap. 1/25; patient presents for follow-up. She is still residing in a skilled nursing facility. She reports mild pain to the left  lower extremity wound bed. She states she is going to see a podiatrist soon. 2/8; patient presents for follow-up. She has moved back to her residential community from her skilled nursing facility. She has no issues or complaints today. She denies signs of systemic infections. Laura Santiago, Laura Santiago ([id number]) 2/15; patient presents for follow-up. He has no issues or complaints today. She denies systemic signs of infection. 2/22; patient presents for follow-up. She has no issues or complaints today. She denies signs of infection. 3/1; patient presents for follow-up. She states that home health came out the day after she was seen in our clinic and yesterday to do the wrap change. She denies signs of infection. She reports excoriated skin on the ankle. 3/8; patient presents for follow-up. She has no issues or complaints today. She denies signs of infection. 3/15; patient presents for follow-up. Home health has been coming out to change the dressings. She reports more tenderness to the wound site. She denies purulent drainage, increased warmth or erythema to the area. 4/5; patient presents for follow-up. She has missed her last 2 clinic appointments. I have not seen her in 3 weeks. She was recently hospitalized for altered mental status. She was involuntarily committed. She was evaluated by psychiatry and deemed to have competency. There was no specific cause of her altered mental status. It was concluded that her physical and mental health were declining due to her chronic medical conditions. Currently home health has been coming out for dressing changes. Patient has also been doing her own dressing changes. She reports more skin breakdown to the periwound and now has a new wound. She denies fever/chills. She reports continued tenderness to the wound site. 4/12; patient with significant venous insufficiency and a large wound on her left lower leg taking up about 80% of the circumference of her lower leg.  Cultures of this grew MRSA and Pseudomonas. She had completed a course of ciprofloxacin now is  starting doxycycline. She has been using Dakin's wet-to-dry and a Tubigrip. She has home health twice a week and we change it once. 4/19; patient presents for follow-up. She completed her course of doxycycline. She has been using Dakin's wet-to-dry dressing and Tubigrip. Home health changes the dressing twice weekly. Currently she has no issues or complaints. 4/26; patient presents for follow-up. At last clinic visit orders for home health were Iodosorb under compression therapy. Unfortunately they did not have the dressing and have been using Dakin's and gentamicin under the wrap. Patient currently denies signs of infection. She has no issues or complaints today. 5/3; patient presents for follow-up. Again Iodosorb has not been used under the compression therapy when home health comes out to change the wrap and dressing. They have been using Sorbact. It is unclear why this is happening since we send orders weekly to the agency. She denies signs of infection. Patient has not purchased the [place] antibiotics. We reached out to the company and they said they have been trying to contact her on a regular basis. We gave the patient the number to call to order the medication. 5/10; patient presents for follow-up. She has no issues or complaints today. Again home health has not been using Iodosorb. Mepilex was on the wound bed. No other dressings noted. She brought in her [facility] antibiotics. She denies signs of infection. 5/17; patient presents for follow-up. Home health has come out twice since she was last seen. Joint well she has been using Keystone antibiotic with Sorbact under the compression wrap. She has no issues or complaints today. She denies signs of infection. 5/24; patient presents for follow-up. We have been using Keystone antibiotics with Sorbact under compression therapy. She is tolerating  the treatment well. She is reporting improvement in wound healing. She denies signs of infection. Objective Constitutional Vitals Time Taken: 10:45 AM, Height: 66 in, Weight: 153 lbs, BMI: 24.7, Temperature: 97.9 F, Pulse: 74 bpm, Respiratory Rate: 18 breaths/min, Blood Pressure: 152/73 mmHg. General Notes: Left lower extremity: Her previous large wound has been divided by islands of epithelization and now she has 3 wounds. These all contain granulation tissue, slough and biofilm. No surrounding signs of infection. Integumentary (Hair, Skin) Wound #1 status is Open. Original cause of wound was Gradually Appeared. The date acquired was: 04/06/2021. The wound has been in treatment 43 weeks. The wound is located on the Left,Medial Lower Leg. The wound measures 10.3cm length x 13.4cm width x 0.2cm depth; 108.401cm^2 area and 21.68cm^3 volume. There is Fat Layer (Subcutaneous Tissue) exposed. There is no tunneling or undermining noted. There is a medium amount of serosanguineous drainage noted. The wound margin is flat and intact. There is medium (34-66%) red, pink granulation within the wound bed. There is a medium (34-66%) amount of necrotic tissue within the wound bed including Adherent Slough. Assessment Active Problems Laura Santiago, Laura Santiago. 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 The size and appearance has improved greatly since starting the medication. I recommended continuing this with Sorbact under 3 layer compression. I debrided nonviable tissue. No surrounding soft tissue infection. Follow-up in 1 week. Procedures Wound #1 Pre-procedure diagnosis  of Wound #1 is a Venous Leg Ulcer located on the Left,Medial Lower Leg .Severity of  Tissue Pre Debridement is: Fat layer exposed. There was a Excisional Skin/Subcutaneous Tissue Debridement with a total area of 130 sq cm performed by Kalman Shan, MD. With the following instrument(s): Curette to remove Viable and Non-Viable tissue/material. Material removed includes Subcutaneous Tissue, Slough, Skin: Epidermis, and Biofilm after achieving pain control using Lidocaine 4% Topical Solution. No specimens were taken. A time out was conducted at 11:10, prior to the start of the procedure. A Moderate amount of bleeding was controlled with Pressure. The procedure was tolerated well with a pain level of 0 throughout and a pain level of 0 following the procedure. Post Debridement Measurements: 10.3cm length x 13.4cm width x 0.2cm depth; 21.68cm^3 volume. Character of Wound/Ulcer Post Debridement is improved. Severity of Tissue Post Debridement is: Fat layer exposed. Post procedure Diagnosis Wound #1: Same as Pre-Procedure Plan Follow-up Appointments: Return Appointment in 1 week. Nurse Visit as needed Home Health: [place]: - [place] for wound care. May utilize formulary equivalent dressing for wound treatment orders unless otherwise specified. Home Health Nurse may visit [facility] to address patient s wound care needs. **Please direct any NON-WOUND related issues/requests for orders to patient's Primary Care Physician. **If current dressing causes regression in wound condition, may D/C ordered dressing product/s and apply Normal Saline Moist Dressing daily until next [place] or Other MD appointment. **Notify [facility] of regression in wound condition at [phone]. Other Home Health Orders/Instructions: - Dressing change 3 x weekly, once by wound care and once at wound clinic weekly. PLEASE make sure frequency between dressing changes is  appropriate. Bathing/ Shower/ Hygiene: May shower with wound dressing protected with water repellent cover or cast protector. No tub bath. Anesthetic (Use 'Patient Medications' Section for Anesthetic Order Entry): Lidocaine applied to wound bed Edema Control - Lymphedema / Segmental Compressive Device / Other: Optional: One layer of unna paste to top of compression wrap (to act as an anchor). - PLEASE when applying wrap start from toes and go up to just below knee Elevate, Exercise Daily and Avoid Standing for Long Periods of Time. Elevate legs to the level of the heart and pump ankles as often as possible Elevate leg(s) parallel to the floor when sitting. DO YOUR BEST to sleep in the bed at night. DO NOT sleep in your recliner. Long hours of sitting in a recliner leads to swelling of the legs and/or potential wounds on your backside. Additional Orders / Instructions: Follow Nutritious Diet and Increase Protein Intake Medications-Please add to medication list.: P.O. Antibiotics - continue as prescribed WOUND #1: - Lower Leg Wound Laterality: Left, Medial Cleanser: Wound Cleanser (Home Health) 3 x Per Week/30 Days Discharge Instructions: Wash your hands with soap and water. Remove old dressing, discard into plastic bag and place into trash. Cleanse the wound with Wound Cleanser prior to applying a clean dressing using gauze sponges, not tissues or cotton balls. Do not scrub or use excessive force. Pat dry using gauze sponges, not tissue or cotton balls. Peri-Wound Care: Nystatin Cream USP 30 (g) 3 x Per Week/30 Days Discharge Instructions: Use Nystatin Cream as directed. MIx 1:1 with TCA cream and applied to peri wound Peri-Wound Care: Triamcinolone Acetonide Cream, 0.1%, 15 (g) tube 3 x Per Week/30 Days Discharge Instructions: Apply as directed. mix 1:1 with nystatin cream for peri wound Primary Dressing: Cutimed Sorbact 1.5x 2.38 (in/in) 3 x Per Week/30 Days Discharge Instructions: A  bacteria- and fungi binding wound dressing, suitable for cavities and fistulas. It  is suitable as a wound filler Vereen, Laura M. ([id number]) and allows the passage of wound exudate into a secondary dressing. The dressing helps reducing odor and pain and can improve healing. Primary Dressing: keystone compound 3 x Per Week/30 Days Discharge Instructions: apply to wound bed as directed on medication label PLEASE follow direction on pill bottle for proper mixing Secondary Dressing: Zetuvit Absorbent Pad, 4x8 (in/in) 3 x Per Week/30 Days Compression Wrap: 3-LAYER WRAP - Profore Lite LF 3 Multilayer Compression Bandaging System 3 x Per Week/30 Days Discharge Instructions: Apply 3 multi-layer wrap as prescribed. 1. In office sharp debridement 2. Keystone antibiotic with Sorbact under 3 layer compression 3. Follow-up in 1 week Electronic Signature(s) Signed: 05/03/2022 12:13:54 PM By: Kalman Shan DO Entered By: Kalman Shan on 05/03/2022 12:12:37 Laura Santiago ([id number]) -------------------------------------------------------------------------------- SuperBill Details Patient Name: Laura Santiago. Date of Service: 05/03/2022 Medical Record Number: [id number] Patient Account Number: [id number] Date of Birth/Sex: Apr 10, 1946 (76 y.o. F) Treating RN: Cornell Barman Primary Care Provider: Hilda Lias, [place] Other Clinician: [name] Referring Provider: [facility] Treating Provider/Extender: Yaakov Guthrie in Treatment: 10 Diagnosis Coding ICD-10 Codes Code Description [id number] 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 I87.2 Venous insufficiency (chronic) (peripheral) Z79.01 Long term (current) use of anticoagulants I10 Essential (primary) hypertension 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 Facility Procedures CPT4 Code:  63845364 Description: 68032 - DEB SUBQ TISSUE 20 SQ CM/< Modifier: Quantity: 1 CPT4 Code: Description: ICD-10 Diagnosis Description L97.822 Non-pressure chronic ulcer of other part of left lower leg with fat layer Modifier: exposed Quantity: CPT4 Code: [id number] Description: 03704 - DEB SUBQ TISS EA ADDL 20CM Modifier: Quantity: 6 CPT4 Code: Description: ICD-10 Diagnosis Description L97.822 Non-pressure chronic ulcer of other part of left lower leg with fat layer Modifier: exposed Quantity: Physician Procedures CPT4 Code: 8889169 Description: 11042 - WC PHYS SUBQ TISS 20 SQ CM Modifier: Quantity: 1 CPT4 Code: Description: ICD-10 Diagnosis Description L97.822 Non-pressure chronic ulcer of other part of left lower leg with fat layer Modifier: exposed Quantity: CPT4 Code: [id number] Description: 11045 - WC PHYS SUBQ TISS EA ADDL 20 CM Modifier: Quantity: 6 CPT4 Code: Description: ICD-10 Diagnosis Description L97.822 Non-pressure chronic ulcer of other part of left lower leg with fat layer Modifier: exposed Quantity: Electronic Signature(s) Signed: 05/03/2022 12:13:54 PM By: Kalman Shan DO Entered By: Kalman Shan on 05/03/2022 12:13:16

## 2022-05-03 NOTE — Progress Notes (Signed)
Laura Santiago, Laura Santiago ([id number]) Visit Report for 05/03/2022 Arrival Information Details Patient Name: Laura Santiago, Laura Santiago. Date of Service: 05/03/2022 9:45 AM Medical Record Number: [id number] Patient Account Number: [id number] Date of Birth/Sex: January 12, 1946 (76 y.o. F) Treating RN: Alycia Rossetti Primary Care Alveria Mcglaughlin: [facility], [place] Other Clinician: [name] Referring Allessandra Bernardi: [facility] Treating Raequon Catanzaro/Extender: Yaakov Guthrie in Treatment: 75 Visit Information History Since Last Visit All ordered tests and consults were completed: No Patient Arrived: [name] Added or deleted any medications: No Arrival Time: 10:45 Any new allergies or adverse reactions: No Accompanied By: self Had a fall or experienced change in No Transfer Assistance: None activities of daily living that may affect Patient Identification Verified: Yes risk of falls: Secondary Verification Process Completed: Yes Signs or symptoms of abuse/neglect since last visito No Patient Requires Transmission-Based No Hospitalized since last visit: No Precautions: Implantable device outside of the clinic excluding No Patient Has Alerts: Yes cellular tissue based products placed in the center Patient Alerts: PT [place] since last visit: ANIMAL Has Dressing in Place as Prescribed: Yes ABI 07/11/21 Has Compression in Place as Prescribed: Yes R) 1.16 L) 1.27 Pain Present Now: No Electronic Signature(s) Signed: 05/03/2022 4:54:43 PM By: Alycia Rossetti Entered By: Alycia Rossetti on 05/03/2022 11:23:54 Laura Santiago ([id number]) -------------------------------------------------------------------------------- Clinic Level of Care Assessment Details Patient Name: Laura Santiago. Date of Service: 05/03/2022 9:45 AM Medical Record Number: [id number] Patient Account Number: [id number] Date of Birth/Sex: 1946/07/30 (76 y.o. F) Treating RN: Alycia Rossetti Primary Care Christelle Igoe: [facility],  [place] Other Clinician: [name] Referring Ronni Osterberg: [facility] Treating Dajha Urquilla/Extender: Yaakov Guthrie in Treatment: 82 Clinic Level of Care Assessment Items TOOL 1 Quantity Score []  - Use when EandM and Procedure is performed on INITIAL visit 0 ASSESSMENTS - Nursing Assessment / Reassessment []  - General Physical Exam (combine w/ comprehensive assessment (listed just below) when performed on new 0 pt. evals) []  - 0 Comprehensive Assessment (HX, ROS, Risk Assessments, Wounds Hx, etc.) ASSESSMENTS - Wound and Skin Assessment / Reassessment []  - Dermatologic / Skin Assessment (not related to wound area) 0 ASSESSMENTS - Ostomy and/or Continence Assessment and Care []  - Incontinence Assessment and Management 0 []  - 0 Ostomy Care Assessment and Management (repouching, etc.) PROCESS - Coordination of Care []  - Simple Patient / Family Education for ongoing care 0 []  - 0 Complex (extensive) Patient / Family Education for ongoing care []  - 0 Staff obtains Programmer, systems, Records, Test Results / Process Orders []  - 0 Staff telephones HHA, Nursing Homes / Clarify orders / etc []  - 0 Routine Transfer to another Facility (non-emergent condition) []  - 0 Routine Hospital Admission (non-emergent condition) []  - 0 New Admissions / Biomedical engineer / Ordering NPWT, Apligraf, etc. []  - 0 Emergency Hospital Admission (emergent condition) PROCESS - Special Needs []  - Pediatric / Minor Patient Management 0 []  - 0 Isolation Patient Management []  - 0 Hearing / Language / Visual special needs []  - 0 Assessment of Community assistance (transportation, D/C planning, etc.) []  - 0 Additional assistance / Altered mentation []  - 0 Support Surface(s) Assessment (bed, cushion, seat, etc.) INTERVENTIONS - Miscellaneous []  - External ear exam 0 []  - 0 Patient Transfer (multiple staff / Civil Service fast streamer / Similar devices) []  - 0 Simple Staple / Suture removal (25 or less) []  -  0 Complex Staple / Suture removal (26 or more) []  - 0 Hypo/Hyperglycemic Management (do not check if billed separately) []  - 0 Ankle / Brachial Index (  ABI) - do not check if billed separately Has the patient been seen at the hospital within the last three years: Yes Total Score: 0 Level Of Care: ____ Laura Santiago ([id number]) Electronic Signature(s) Signed: 05/03/2022 4:54:43 PM By: Alycia Rossetti Entered By: Alycia Rossetti on 05/03/2022 11:20:57 Laura Santiago ([id number]) -------------------------------------------------------------------------------- Encounter Discharge Information Details Patient Name: Laura Santiago, Laura Santiago. Date of Service: 05/03/2022 9:45 AM Medical Record Number: [id number] Patient Account Number: [id number] Date of Birth/Sex: Mar 14, 1946 (76 y.o. F) Treating RN: Alycia Rossetti Primary Care Billie Trager: [facility], [place] Other Clinician: [name] Referring Aleea Hendry: [facility] Treating Vernell Back/Extender: Yaakov Guthrie in Treatment: 27 Encounter Discharge Information Items Post Procedure Vitals Discharge Condition: Stable Temperature (F): 97.9 Ambulatory Status: Stretcher Pulse (bpm): 74 Discharge Destination: Home Respiratory Rate (breaths/min): 18 Transportation: Private Auto Blood Pressure (mmHg): 152/73 Accompanied By: self Schedule Follow-up Appointment: Yes Clinical Summary of Care: Patient Declined Electronic Signature(s) Signed: 05/03/2022 4:54:43 PM By: Alycia Rossetti Entered By: Alycia Rossetti on 05/03/2022 11:22:41 Laura Santiago ([id number]) -------------------------------------------------------------------------------- Lower Extremity Assessment Details Patient Name: Laura Santiago. Date of Service: 05/03/2022 9:45 AM Medical Record Number: [id number] Patient Account Number: [id number] Date of Birth/Sex: 1946-10-12 (76 y.o. F) Treating RN: Alycia Rossetti Primary Care Lyndel Dancel: [facility] [place] Other Clinician:  [name] Referring Haruki Arnold: [facility] Treating Clever Geraldo/Extender: Yaakov Guthrie in Treatment: 43 Edema Assessment Assessed: [Left: No] [Right: No] [Left: Edema] [Right: :] Calf Left: Right: Point of Measurement: 29 cm From Medial Instep 34.5 cm Ankle Left: Right: Point of Measurement: 10 cm From Medial Instep 21.4 cm Electronic Signature(s) Signed: 05/03/2022 4:54:43 PM By: Alycia Rossetti Entered By: Alycia Rossetti on 05/03/2022 11:12:17 Laura Santiago ([id number]) -------------------------------------------------------------------------------- Multi Wound Chart Details Patient Name: Laura Santiago. Date of Service: 05/03/2022 9:45 AM Medical Record Number: [id number] Patient Account Number: [id number] Date of Birth/Sex: 03/11/1946 (76 y.o. F) Treating RN: Alycia Rossetti Primary Care Sharnita Bogucki: [facility] Other Clinician: [name] Referring Lonita Debes: [facility] Treating Amyla Heffner/Extender: Yaakov Guthrie in Treatment: 53 Photos: [1:No Photos] [N/A:N/A] Wound Location: [1:Left, Medial Lower Leg] [N/A:N/A] Wounding Event: [1:Gradually Appeared] [N/A:N/A] Primary Etiology: [1:Venous Leg Ulcer] [N/A:N/A] Comorbid History: [1:Hypertension, Osteoarthritis, Received Chemotherapy, Received Radiation] [N/A:N/A] Date Acquired: [1:04/06/2021] [N/A:N/A] Weeks of Treatment: [1:43] [N/A:N/A] Wound Status: [1:Open] [N/A:N/A] Wound Recurrence: [1:No] [N/A:N/A] Clustered Wound: [1:Yes] [N/A:N/A] Measurements L x W x D (cm) [1:10.3x13.4x0.2] [N/A:N/A] Area (cm) : [1:108.401] [N/A:N/A] Volume (cm) : [1:21.68] [N/A:N/A] % Reduction in Area: [1:-20.00%] [N/A:N/A] % Reduction in Volume: [1:-20.00%] [N/A:N/A] Classification: [1:Full Thickness Without Exposed Support Structures] [N/A:N/A] Exudate Amount: [1:Medium] [N/A:N/A] Exudate Type: [1:Serosanguineous] [N/A:N/A] Exudate Color: [1:red, brown] [N/A:N/A] Wound Margin: [1:Flat and  Intact] [N/A:N/A] Granulation Amount: [1:Medium (34-66%)] [N/A:N/A] Granulation Quality: [1:Red, Pink] [N/A:N/A] Necrotic Amount: [1:Medium (34-66%)] [N/A:N/A] Exposed Structures: [1:Fat Layer (Subcutaneous Tissue): Yes Fascia: No Tendon: No Muscle: No Joint: No Bone: No Small (1-33%)] [N/A:N/A N/A] Treatment Notes Electronic Signature(s) Signed: 05/03/2022 4:54:43 PM By: Alycia Rossetti Entered By: Alycia Rossetti on 05/03/2022 11:14:56 Laura Santiago ([id number]) -------------------------------------------------------------------------------- False Pass Details Patient Name: Laura Santiago. Date of Service: 05/03/2022 9:45 AM Medical Record Number: [id number] Patient Account Number: [id number] Date of Birth/Sex: 02/21/1946 (76 y.o. F) Treating RN: Alycia Rossetti Primary Care Estie Sproule: [facility], [place] Other Clinician: [name] Referring Caydence Enck: [facility] Treating Tekoa Amon/Extender: Yaakov Guthrie in Treatment: 28 Active Inactive Soft Tissue Infection Nursing Diagnoses: Impaired tissue integrity Potential for infection: soft tissue Goals: Patient's soft tissue infection will resolve  Date Initiated: 03/15/2022 Target Resolution Date: 04/27/2022 Goal Status: Active Signs and symptoms of infection will be recognized early to allow for prompt treatment Date Initiated: 03/15/2022 Date Inactivated: 04/26/2022 Target Resolution Date: 04/27/2022 Goal Status: Met Interventions: Assess signs and symptoms of infection every visit Treatment Activities: Culture and sensitivity : 03/15/2022 Notes: Wound/Skin Impairment Nursing Diagnoses: Impaired tissue integrity Goals: Patient/caregiver will verbalize understanding of skin care regimen Date Initiated: 07/06/2021 Date Inactivated: 07/27/2021 Target Resolution Date: 07/06/2021 Goal Status: Met Ulcer/skin breakdown will have a volume reduction of 30% by week 4 Date Initiated: 07/06/2021 Date  Inactivated: 10/12/2021 Target Resolution Date: 08/06/2021 Goal Status: Unmet Unmet Reason: cont tx Ulcer/skin breakdown will have a volume reduction of 50% by week 8 Date Initiated: 07/06/2021 Target Resolution Date: 09/06/2021 Goal Status: Active Ulcer/skin breakdown will have a volume reduction of 80% by week 12 Date Initiated: 07/06/2021 Target Resolution Date: 10/06/2021 Goal Status: Active Ulcer/skin breakdown will heal within 14 weeks Date Initiated: 07/06/2021 Target Resolution Date: 11/06/2021 Goal Status: Active Interventions: Assess patient/caregiver ability to obtain necessary supplies Assess patient/caregiver ability to perform ulcer/skin care regimen upon admission and as needed Assess ulceration(s) every visit Treatment Activities: Referred to DME Haila Dena for dressing supplies : 07/06/2021 Skin care regimen initiated : 07/06/2021 Notes: Laura Santiago, Laura Santiago ([id number]) Electronic Signature(s) Signed: 05/03/2022 4:54:43 PM By: Alycia Rossetti Entered By: Alycia Rossetti on 05/03/2022 11:13:10 Laura Santiago ([id number]) -------------------------------------------------------------------------------- Pain Assessment Details Patient Name: Laura Santiago. Date of Service: 05/03/2022 9:45 AM Medical Record Number: [id number] Patient Account Number: [id number] Date of Birth/Sex: [birth year] (76 y.o. F) Treating RN: Alycia Rossetti Primary Care Jenkins Risdon: [facility], [place] Other Clinician: [name] Referring Akili Cuda: [facility] Treating Larrell Rapozo/Extender: Yaakov Guthrie in Treatment: 70 Active Problems Location of Pain Severity and Description of Pain Patient Has Paino No Site Locations Pain Management and Medication Current Pain Management: Electronic Signature(s) Signed: 05/03/2022 4:54:43 PM By: Alycia Rossetti Entered By: Alycia Rossetti on 05/03/2022 11:23:30 Laura Santiago  ([id number]) -------------------------------------------------------------------------------- Patient/Caregiver Education Details Patient Name: Laura Santiago. Date of Service: 05/03/2022 9:45 AM Medical Record Number: [id number] Patient Account Number: [id number] Date of Birth/Gender: 1946-11-06 (76 y.o. F) Treating RN: Alycia Rossetti Primary Care Physician: [facility], [place] Other Clinician: [name] Referring Physician: [facility] Treating Physician/Extender: Yaakov Guthrie in Treatment: 49 Education Assessment Education Provided To: Patient Education Topics Provided Wound/Skin Impairment: Methods: Explain/Verbal Responses: State content correctly Electronic Signature(s) Signed: 05/03/2022 4:54:43 PM By: Alycia Rossetti Entered By: Alycia Rossetti on 05/03/2022 11:21:11 Laura Santiago ([id number]) -------------------------------------------------------------------------------- Wound Assessment Details Patient Name: Laura Santiago. Date of Service: 05/03/2022 9:45 AM Medical Record Number: [id number] Patient Account Number: [id number] Date of Birth/Sex: [birth year] (76 y.o. F) Treating RN: Alycia Rossetti Primary Care Montel Vanderhoof: [facility], [place] Other Clinician: [name] Referring Rayen Dafoe: [facility] Treating Larna Capelle/Extender: Yaakov Guthrie in Treatment: 43 Wound Status Wound Number: 1 Primary Venous Leg Ulcer Etiology: Wound Location: Left, Medial Lower Leg Wound Status: Open Wounding Event: Gradually Appeared Comorbid Hypertension, Osteoarthritis, Received Chemotherapy, Date Acquired: 04/06/2021 History: Received Radiation Weeks Of Treatment: 43 Clustered Wound: Yes Photos Wound Measurements Length: (cm) 10.3 Width: (cm) 13.4 Depth: (cm) 0.2 Area: (cm) 108.401 Volume: (cm) 21.68 % Reduction in Area: -20% % Reduction in Volume: -20% Epithelialization: Small (1-33%) Tunneling: No Undermining: No Wound  Description Classification: Full Thickness Without Exposed Support Structures Wound Margin: Flat and Intact Exudate Amount: Medium Exudate Type: Serosanguineous Exudate Color: red, brown Foul Odor After Cleansing: No Slough/Fibrino  Yes Wound Bed Granulation Amount: Medium (34-66%) Exposed Structure Granulation Quality: Red, Pink Fascia Exposed: No Necrotic Amount: Medium (34-66%) Fat Layer (Subcutaneous Tissue) Exposed: Yes Necrotic Quality: Adherent Slough Tendon Exposed: No Muscle Exposed: No Joint Exposed: No Bone Exposed: No Treatment Notes Wound #1 (Lower Leg) Wound Laterality: Left, Medial Cleanser Wound Cleanser Discharge Instruction: Wash your hands with soap and water. Remove old dressing, discard into plastic bag and place into trash. Cleanse the wound with Wound Cleanser prior to applying a clean dressing using gauze sponges, not tissues or cotton balls. Do not scrub or use excessive force. Pat dry using gauze sponges, not tissue or cotton balls. Laura Santiago, Laura Santiago ([id number]) Peri-Wound Care Nystatin Cream USP 30 (g) Discharge Instruction: Use Nystatin Cream as directed. MIx 1:1 with TCA cream and applied to peri wound Triamcinolone Acetonide Cream, 0.1%, 15 (g) tube Discharge Instruction: Apply as directed. mix 1:1 with nystatin cream for peri wound Topical Primary Dressing Cutimed Sorbact 1.5x 2.38 (in/in) Discharge Instruction: A bacteria- and fungi binding wound dressing, suitable for cavities and fistulas. It is suitable as a wound filler and allows the passage of wound exudate into a secondary dressing. The dressing helps reducing odor and pain and can improve healing. keystone compound Discharge Instruction: apply to wound bed as directed on medication label PLEASE follow direction on pill bottle for proper mixing Secondary Dressing Zetuvit Absorbent Pad, 4x8 (in/in) Secured With Compression Wrap 3-LAYER WRAP - Profore Lite LF 3 Multilayer Compression  Bandaging System Discharge Instruction: Apply 3 multi-layer wrap as prescribed. Compression Stockings Add-Ons Electronic Signature(s) Signed: 05/03/2022 4:54:43 PM By: Alycia Rossetti Entered By: Alycia Rossetti on 05/03/2022 11:37:39 Laura Santiago, Laura Santiago ([id number]) -------------------------------------------------------------------------------- Vitals Details Patient Name: Laura Santiago. Date of Service: 05/03/2022 9:45 AM Medical Record Number: [id number] Patient Account Number: [id number] Date of Birth/Sex: 1946/02/26 (76 y.o. F) Treating RN: Alycia Rossetti Primary Care Zula Hovsepian: [facility], [place] Other Clinician: [name] Referring Imanie Darrow: [facility] Treating Merl Bommarito/Extender: Yaakov Guthrie in Treatment: 74 Vital Signs Time Taken: 10:45 Temperature (F): 97.9 Height (in): 66 Pulse (bpm): 74 Weight (lbs): 153 Respiratory Rate (breaths/min): 18 Body Mass Index (BMI): 24.7 Blood Pressure (mmHg): 152/73 Reference Range: 80 - 120 mg / dl Electronic Signature(s) Signed: 05/03/2022 4:54:43 PM By: Alycia Rossetti Entered By: Alycia Rossetti on 05/03/2022 11:23:24

## 2022-05-04 ENCOUNTER — Encounter
Admission: RE | Admit: 2022-05-04 | Discharge: 2022-05-04 | Disposition: A | Discharge status: Home / Self Care | Payer: Medicare Other | Source: Ambulatory Visit | Attending: Oncology | Admitting: Oncology

## 2022-05-04 ENCOUNTER — Ambulatory Visit
Admission: RE | Admit: 2022-05-04 | Discharge: 2022-05-04 | Disposition: A | Discharge status: Home / Self Care | Payer: Medicare Other | Source: Ambulatory Visit | Attending: Oncology | Admitting: Oncology

## 2022-05-04 DIAGNOSIS — C7951 Secondary malignant neoplasm of bone: Secondary | ICD-10-CM | POA: Insufficient documentation

## 2022-05-04 DIAGNOSIS — C50919 Malignant neoplasm of unspecified site of unspecified female breast: Secondary | ICD-10-CM | POA: Diagnosis present

## 2022-05-04 MED ORDER — IOHEXOL 300 MG/ML  SOLN
80.0000 mL | Freq: Once | INTRAMUSCULAR | Status: AC | PRN
  Administered 2022-05-04: 80 mL via INTRAVENOUS

## 2022-05-04 MED ORDER — ONDANSETRON 4 MG PO TBDP
ORAL_TABLET | ORAL | Status: AC
  Filled 2022-05-04: qty 1

## 2022-05-04 MED ORDER — TECHNETIUM TC 99M MEDRONATE IV KIT
20.0000 | PACK | Freq: Once | INTRAVENOUS | Status: AC | PRN
  Administered 2022-05-04: 21.67 via INTRAVENOUS

## 2022-05-04 MED ORDER — ONDANSETRON 4 MG PO TBDP
4.0000 mg | ORAL_TABLET | Freq: Once | ORAL | Status: AC
  Administered 2022-05-04: 4 mg via ORAL
  Filled 2022-05-04: qty 1

## 2022-05-04 NOTE — Progress Notes (Signed)
This radiology nurse called to administer zofran '4mg'$  sl post nuclear study per request of Narda Rutherford NP/radiology  for nausea/vomiting/diarrhea which patient stating has happened before. Awake/alert and oriented at this time prior to being discharged home with caregiver who is present. Nuclear med staff present with patient at this time.

## 2022-05-04 NOTE — Progress Notes (Signed)
Received request from Nuclear med staff to assess pt in restroom that was having reaction to IV contrast. Pt observed to be sitting on toilet having diarrhea and intermittent episodes of emesis. Pt states that she had the same reaction last time she was given contrast. Pt in agreement to take ODT Zofran for N/V. Pt denies shortness of breath, rash, difficulty swallowing or other s/sx of allergic reaction.   [name], RN to given PO Zofran.  Pt A&O, speaks in full sentences with no airway compromise. No rash or swelling noted. She is in no distress.   Pt and caregiver in agreement with plan to go home once she is feeling better.      Narda Rutherford, AGNP-BC 05/04/2022, 4:06 PM

## 2022-05-10 ENCOUNTER — Encounter (HOSPITAL_BASED_OUTPATIENT_CLINIC_OR_DEPARTMENT_OTHER): Payer: Medicare Other | Admitting: Internal Medicine

## 2022-05-10 DIAGNOSIS — L97822 Non-pressure chronic ulcer of other part of left lower leg with fat layer exposed: Secondary | ICD-10-CM

## 2022-05-10 DIAGNOSIS — I87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: Secondary | ICD-10-CM

## 2022-05-10 NOTE — Progress Notes (Signed)
RONNITA, PAZ ([id number]) Visit Report for 05/10/2022 Chief Complaint Document Details Patient Name: Laura Santiago, Laura Santiago. Date of Service: 05/10/2022 10:30 AM Medical Record Number: [id number] Patient Account Number: [id number] Date of Birth/Sex: 03/26/1946 (76 y.o. F) Treating RN: Primary Care Provider: [facility] Other Clinician: Referring Provider: [facility] Treating Provider/Extender: Yaakov Guthrie in Treatment: 102 Information Obtained from: Patient Chief Complaint Left lower extremity wound Right toe wounds Left upper lateral thigh wounds Electronic Signature(s) Signed: 05/10/2022 12:03:52 PM By: Kalman Shan DO Signed: 05/10/2022 4:45:49 PM By: Alycia Rossetti Entered By: Alycia Rossetti on 05/10/2022 11:50:32 Laura Santiago ([id number]) -------------------------------------------------------------------------------- Debridement Details Patient Name: Laura Santiago. Date of Service: 05/10/2022 10:30 AM Medical Record Number: [id number] Patient Account Number: [id number] Date of Birth/Sex: [birth year] (76 y.o. F) Treating RN: Alycia Rossetti Primary Care Provider: [facility] Other Clinician: Referring Provider: [facility] Treating Provider/Extender: Yaakov Guthrie in Treatment: 44 Debridement Performed for Wound #1 Left,Medial Lower Leg Assessment: Performed By: Physician Kalman Shan, MD Debridement Type: Debridement Severity of Tissue Pre Debridement: Fat layer exposed Level of Consciousness (Pre- Awake and Alert procedure): Pre-procedure Verification/Time Out Yes - 11:20 Taken: Start Time: 11:20 Pain Control: Lidocaine 4% Topical Solution Total Area Debrided (L x W): 7 (cm) x 11 (cm) = 77 (cm) Tissue and other material Viable, Non-Viable, Slough, Subcutaneous, Slough debrided: Level: Skin/Subcutaneous Tissue Debridement Description: Excisional Instrument: Curette Bleeding: Minimum Hemostasis  Achieved: Pressure End Time: 11:30 Procedural Pain: 0 Post Procedural Pain: 0 Response to Treatment: Procedure was tolerated well Level of Consciousness (Post- Awake and Alert procedure): Post Debridement Measurements of Total Wound Length: (cm) 8.2 Width: (cm) 11 Depth: (cm) 0.2 Volume: (cm) 14.169 Character of Wound/Ulcer Post Debridement: Improved Severity of Tissue Post Debridement: Fat layer exposed Post Procedure Diagnosis Same as Pre-procedure Electronic Signature(s) Signed: 05/10/2022 12:03:52 PM By: Kalman Shan DO Signed: 05/10/2022 4:45:49 PM By: Alycia Rossetti Entered By: Alycia Rossetti on 05/10/2022 11:48:44 Laura Santiago ([id number]) -------------------------------------------------------------------------------- HPI Details Patient Name: Laura Santiago. Date of Service: 05/10/2022 10:30 AM Medical Record Number: [id number] Patient Account Number: [id number] Date of Birth/Sex: 1946-10-24 (76 y.o. F) Treating RN: Primary Care Provider: [facility], [place] Other Clinician: Referring Provider: [facility] Treating Provider/Extender: Yaakov Guthrie in Treatment: 26 History of Present Illness HPI Description: Admission 7/27 Ms. Arien Morine is a 76 year old female with a past medical history of ADHD, metastatic breast cancer, stage IV chronic kidney disease, history of DVT on Xarelto and chronic venous insufficiency that presents to the clinic for a chronic left lower extremity wound. She recently moved to [facility] 4 days ago. She was being followed by wound care center in [name]. She reports a 10-year history of wounds to her left lower extremity that eventually do heal with debridement and compression therapy. She states that the current wound reopened 4 months ago and she is using Vaseline and Coban. She denies signs of infection. 8/3; patient presents for 1 week follow-up. She reports no issues or complaints today. She states  she had vascular studies done in the last week. She denies signs of infection. She brought her little service dog with her today. 8/17; patient presents for follow-up. She has missed her last clinic appointment. She states she took the wrap off and attempted to rewrap her leg. She is having difficulty with transportation. She has her service dog with her today. Overall she feels well and reports improvement in wound healing. She denies signs of infection.  She reports owning an old Velcro wrap compression and has this at her living facility 9/14; patient presents for follow-up. Patient states that over the past 2 to 3 weeks she developed toe wounds to her right foot. She attributes this to tight fitting shoes. She subsequently developed cellulitis in the right leg and has been treated by doxycycline by her oncologist. She reports improvement in symptoms however continues to have some redness and swelling to this leg. To the left lower extremity patient has been having her wraps changed with home health twice weekly. She states that the [employer] is not helping control the drainage. Other than that she has no issues or complaints today. She denies signs of infection to the left lower extremity. 9/21; patient presents for follow-up. She reports seeing infectious disease for her cellulitis. She reports no further management. She has home health that changes the wraps twice weekly. She has no issues or complaints today. She denies signs of infection. 10/5; patient presents for follow-up. She has no issues or complaints today. She denies signs of infection. She states that the right great toe has not been dressed by home health. 10/12; patient presents for follow-up. She has no issues or complaints today. She reports improvement in her wound healing. She has been using silver alginate to the right great toe wound. She denies signs of infection. 10/26; patient presents for follow-up. Home health did  not have sorbact so they continued to use Hydrofera Blue under the wrap. She has been using silver alginate to the great toe wound however she did not have a dressing in place today. She currently denies signs of infection. 11/2; patient presents for follow-up. She has been using sorb act under the compression wrap. She reports using silver alginate to the toe wound again she does not have a dressing in place. She currently denies signs of infection. 11/23; patient presents for follow-up. Unfortunately she has missed her last 2 clinic appointments. She was last seen 3 weeks ago. She did her own compression wrap with Kerlix and Coban yesterday after seeing vein and vascular. She has not been dressing her right great toe wound. She currently denies signs of infection. 11/30; patient presents for 1 week follow-up. She states she changed her dressing last week prior to home health and use sorb act with Dakin's and Hydrofera Blue. Home health has changed the dressing as well and they have been using sorbact. Today she reports increased redness to her right lower extremity. She has a history of cellulitis to this leg. She has been using silver alginate to the right great toe. Unfortunately she had an episode of diarrhea prior to coming in and had feces all over the right leg and to the wrap of her left leg. 12/7; patient presents for 1 week follow-up. She states that home health did not come out to change the dressing and she took it off yesterday. It is unclear if she is dressing the right toe wound. She denies signs of infection. 12/14; patient presents for 1 week follow-up. She has no issues or complaints today. 12/21; patient presents for follow-up. She has no issues or complaints today. She denies signs of infection. 12/28/2021; patient presents for follow-up. She was hospitalized for sepsis secondary to right lower extremity cellulitis On 12/23. She states she is currently at a SNF. She states that  she was started on doxycycline this morning for her right great toe swelling and redness. She is not sure what dressings have been  done to her left lower extremity for the past 3 weeks. She says its been mainly gauze with an Ace wrap. 1/25; patient presents for follow-up. She is still residing in a skilled nursing facility. She reports mild pain to the left lower extremity wound bed. She states she is going to see a podiatrist soon. 2/8; patient presents for follow-up. She has moved back to her residential community from her skilled nursing facility. She has no issues or complaints today. She denies signs of systemic infections. 2/15; patient presents for follow-up. He has no issues or complaints today. She denies systemic signs of infection. 2/22; patient presents for follow-up. She has no issues or complaints today. She denies signs of infection. CRISELDA, STARKE ([id number]) 3/1; patient presents for follow-up. She states that home health came out the day after she was seen in our clinic and yesterday to do the wrap change. She denies signs of infection. She reports excoriated skin on the ankle. 3/8; patient presents for follow-up. She has no issues or complaints today. She denies signs of infection. 3/15; patient presents for follow-up. Home health has been coming out to change the dressings. She reports more tenderness to the wound site. She denies purulent drainage, increased warmth or erythema to the area. 4/5; patient presents for follow-up. She has missed her last 2 clinic appointments. I have not seen her in 3 weeks. She was recently hospitalized for altered mental status. She was involuntarily committed. She was evaluated by psychiatry and deemed to have competency. There was no specific cause of her altered mental status. It was concluded that her physical and mental health were declining due to her chronic medical conditions. Currently home health has been coming out for dressing  changes. Patient has also been doing her own dressing changes. She reports more skin breakdown to the periwound and now has a new wound. She denies fever/chills. She reports continued tenderness to the wound site. 4/12; patient with significant venous insufficiency and a large wound on her left lower leg taking up about 80% of the circumference of her lower leg. Cultures of this grew MRSA and Pseudomonas. She had completed a course of ciprofloxacin now is starting doxycycline. She has been using Dakin's wet-to-dry and a Tubigrip. She has home health twice a week and we change it once. 4/19; patient presents for follow-up. She completed her course of doxycycline. She has been using Dakin's wet-to-dry dressing and Tubigrip. Home health changes the dressing twice weekly. Currently she has no issues or complaints. 4/26; patient presents for follow-up. At last clinic visit orders for home health were Iodosorb under compression therapy. Unfortunately they did not have the dressing and have been using Dakin's and gentamicin under the wrap. Patient currently denies signs of infection. She has no issues or complaints today. 5/3; patient presents for follow-up. Again Iodosorb has not been used under the compression therapy when home health comes out to change the wrap and dressing. They have been using Sorbact. It is unclear why this is happening since we send orders weekly to the agency. She denies signs of infection. Patient has not purchased the [place] antibiotics. We reached out to the company and they said they have been trying to contact her on a regular basis. We gave the patient the number to call to order the medication. 5/10; patient presents for follow-up. She has no issues or complaints today. Again home health has not been using Iodosorb. Mepilex was on the wound bed. No other dressings noted. She  brought in her Keystone antibiotics. She denies signs of infection. 5/17; patient presents for  follow-up. Home health has come out twice since she was last seen. Joint well she has been using Keystone antibiotic with Sorbact under the compression wrap. She has no issues or complaints today. She denies signs of infection. 5/24; patient presents for follow-up. We have been using Keystone antibiotics with Sorbact under compression therapy. She is tolerating the treatment well. She is reporting improvement in wound healing. She denies signs of infection. 5/31; patient presents for follow-up. We continue to do [facility] antibiotics with Sorbact under compression therapy. She continues to report improvement in wound healing. Home health comes out and changes the dressing once weekly. Electronic Signature(s) Signed: 05/10/2022 12:03:52 PM By: Kalman Shan DO Signed: 05/10/2022 4:45:49 PM By: Alycia Rossetti Entered By: Alycia Rossetti on 05/10/2022 11:50:35 Laura Santiago ([id number]) -------------------------------------------------------------------------------- Physical Exam Details Patient Name: LARUEN, RISSER. Date of Service: 05/10/2022 10:30 AM Medical Record Number: [id number] Patient Account Number: [id number] Date of Birth/Sex: [birth year] (76 y.o. F) Treating RN: Primary Care Provider: [facility] Other Clinician: Referring Provider: [facility] Treating Provider/Extender: Yaakov Guthrie in Treatment: 72 Constitutional . Cardiovascular . Psychiatric . Notes Left lower extremity: 1 large wound with granulation tissue, slough and biofilm. No surrounding signs of infection. Electronic Signature(s) Signed: 05/10/2022 12:03:52 PM By: Kalman Shan DO Entered By: Kalman Shan on 05/10/2022 11:40:32 Laura Santiago ([id number]) -------------------------------------------------------------------------------- Physician Orders Details Patient Name: Laura Santiago. Date of Service: 05/10/2022 10:30 AM Medical Record Number: [id number] Patient  Account Number: [id number] Date of Birth/Sex: 25-Dec-1945 (76 y.o. F) Treating RN: Alycia Rossetti Primary Care Provider: [facility] Other Clinician: Referring Provider: [facility] Treating Provider/Extender: Yaakov Guthrie in Treatment: 13 Verbal / Phone Orders: No Diagnosis Coding Follow-up Appointments o Return Appointment in 1 week. o Nurse Visit as needed [place] for wound care. May utilize formulary equivalent dressing for wound treatment orders unless otherwise specified. Home Health Nurse may visit PRN to address patientos wound care needs. o **Please direct any NON-WOUND related issues/requests for orders to patient's Primary Care Physician. **If current dressing causes regression in wound condition, may D/C ordered dressing product/s and apply Normal Saline Moist Dressing daily until next [place] or Other MD appointment. **Notify Wound Healing Center of regression in wound condition at [phone]. o Other Home Health Orders/Instructions: - Dressing change 3 x weekly, once by wound care and once at wound clinic weekly. PLEASE make sure frequency between dressing changes is appropriate. Bathing/ Shower/ Hygiene o May shower with wound dressing protected with water repellent cover or cast protector. o No tub bath. Anesthetic (Use 'Patient Medications' Section for Anesthetic Order Entry) o Lidocaine applied to wound bed Edema Control - Lymphedema / Segmental Compressive Device / Other o Optional: One layer of unna paste to top of compression wrap (to act as an anchor). - PLEASE when applying wrap start from toes and go up to just below knee o Elevate, Exercise Daily and Avoid Standing for Long Periods of Time. o Elevate legs to the level of the heart and pump ankles as often as possible o Elevate leg(s) parallel to the floor when sitting. o DO YOUR BEST  to sleep in the bed at night. DO NOT sleep in your recliner. Long hours of sitting in a recliner leads to swelling of the legs and/or potential wounds on your backside. Additional Orders /  Instructions o Follow Nutritious Diet and Increase Protein Intake Medications-Please add to medication list. o P.O. Antibiotics - continue as prescribed Wound Treatment Wound #1 - Lower Leg Wound Laterality: Left, Medial Cleanser: Wound Cleanser (Home Health) 3 x Per Week/30 Days Discharge Instructions: Wash your hands with soap and water. Remove old dressing, discard into plastic bag and place into trash. Cleanse the wound with Wound Cleanser prior to applying a clean dressing using gauze sponges, not tissues or cotton balls. Do not scrub or use excessive force. Pat dry using gauze sponges, not tissue or cotton balls. Peri-Wound Care: Nystatin Cream USP 30 (g) 3 x Per Week/30 Days Discharge Instructions: Use Nystatin Cream as directed. MIx 1:1 with TCA cream and applied to peri wound Peri-Wound Care: Triamcinolone Acetonide Cream, 0.1%, 15 (g) tube 3 x Per Week/30 Days Discharge Instructions: Apply as directed. mix 1:1 with nystatin cream for peri wound Primary Dressing: Cutimed Sorbact 1.5x 2.38 (in/in) 3 x Per Week/30 Days Discharge Instructions: A bacteria- and fungi binding wound dressing, suitable for cavities and fistulas. It is suitable as a wound filler and allows the passage of wound exudate into a secondary dressing. The dressing helps reducing odor and pain and can improve healing. Primary Dressing: keystone compound 3 x Per Week/30 Days Discharge Instructions: apply to wound bed as directed on medication label PLEASE follow direction on pill bottle for proper mixing GALILEA, QUITO ([id number]) Secondary Dressing: Zetuvit Absorbent Pad, 4x8 (in/in) 3 x Per Week/30 Days Compression Wrap: 3-LAYER WRAP - Profore Lite LF 3 Multilayer Compression Bandaging System 3 x Per Week/30  Days Discharge Instructions: Apply 3 multi-layer wrap as prescribed. Electronic Signature(s) Signed: 05/10/2022 12:03:52 PM By: Kalman Shan DO Signed: 05/10/2022 4:45:49 PM By: Alycia Rossetti Entered By: Alycia Rossetti on 05/10/2022 11:49:07 Laura Santiago ([id number]) -------------------------------------------------------------------------------- Problem List Details Patient Name: HAYDYN, LIDDELL. Date of Service: 05/10/2022 10:30 AM Medical Record Number: [id number] Patient Account Number: [id number] Date of Birth/Sex: 01-06-1946 (76 y.o. F) Treating RN: Primary Care Provider: [facility] Other Clinician: Referring Provider: 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 07/06/2021 No Yes Inactive Problems ICD-10 Code Description Active Date Inactive Date S81.802A Unspecified open wound, left lower leg, initial encounter 07/06/2021 07/06/2021 S91.101A Unspecified open wound of right great toe without damage to nail, initial 08/24/2021 08/24/2021 encounter S91.104A Unspecified open wound of right lesser toe(s) without damage to nail, initial 08/24/2021 08/24/2021 encounter Resolved Problems ICD-10 Code Description Active Date Resolved Date S91.104D Unspecified open wound of right lesser toe(s) without damage to nail, 08/31/2021 08/31/2021 subsequent encounter ETHELYN, CERNIGLIA ([id number]) S91.201D Unspecified open  wound of right great toe with damage to nail, subsequent 08/31/2021 08/31/2021 encounter Electronic Signature(s) Signed: 05/10/2022 12:03:52 PM By: Kalman Shan DO Signed: 05/10/2022 4:45:49 PM By: Alycia Rossetti Entered By: Alycia Rossetti on 05/10/2022 11:50:27 Laura Santiago ([id number]) -------------------------------------------------------------------------------- Progress Note Details Patient Name: Laura Santiago. Date of Service: 05/10/2022 10:30 AM Medical Record Number: [id number] Patient Account Number: [id number] Date of Birth/Sex: 1946-03-05 (76 y.o. F) Treating RN: Primary Care Provider:  [place] Other Clinician: Referring Provider: [facility], [place] Treating Provider/Extender: Yaakov Guthrie in Treatment: 48 Subjective Chief Complaint Information obtained from Patient Left lower extremity wound Right toe wounds Left upper lateral thigh wounds History of Present Illness (HPI) Admission 7/27 Ms. Laura Santiago is a 76 year old female with a past medical history of ADHD, metastatic breast cancer, stage IV chronic kidney disease, history of DVT on Xarelto and chronic venous insufficiency that presents to the clinic for a chronic left lower extremity wound. She recently moved to [facility] 4 days ago. She was being followed by wound care center in [name]. She reports a 10-year history of wounds to her left lower extremity that eventually do heal with debridement and compression therapy. She states that the current wound reopened 4 months ago and she is using Vaseline and Coban. She denies signs of infection. 8/3; patient presents for 1 week follow-up. She reports no issues or complaints today. She states she had vascular studies done in the last week. She denies signs of infection. She brought her little service dog with her today. 8/17; patient presents for follow-up. She has missed her last clinic appointment. She states she took the wrap  off and attempted to rewrap her leg. She is having difficulty with transportation. She has her service dog with her today. Overall she feels well and reports improvement in wound healing. She denies signs of infection. She reports owning an old Velcro wrap compression and has this at her living facility 9/14; patient presents for follow-up. Patient states that over the past 2 to 3 weeks she developed toe wounds to her right foot. She attributes this to tight fitting shoes. She subsequently developed cellulitis in the right leg and has been treated by doxycycline by her oncologist. She reports improvement in symptoms however continues to have some redness and swelling to this leg. To the left lower extremity patient has been having her wraps changed with home health twice weekly. She states that the [facility] is not helping control the drainage. Other than that she has no issues or complaints today. She denies signs of infection to the left lower extremity. 9/21; patient presents for follow-up. She reports seeing infectious disease for her cellulitis. She reports no further management. She has home health that changes the wraps twice weekly. She has no issues or complaints today. She denies signs of infection. 10/5; patient presents for follow-up. She has no issues or complaints today. She denies signs of infection. She states that the right great toe has not been dressed by home health. 10/12; patient presents for follow-up. She has no issues or complaints today. She reports improvement in her wound healing. She has been using silver alginate to the right great toe wound. She denies signs of infection. 10/26; patient presents for follow-up. Home health did not have sorbact so they continued to use Hydrofera Blue under the wrap. She has been using silver alginate to the great toe wound however she did not have a dressing in place today. She currently denies signs of infection. 11/2; patient  presents for follow-up. She has been using sorb act under the compression wrap. She reports using silver alginate to the toe wound again she does not have a dressing in place. She currently denies signs of infection. 11/23; patient presents for follow-up. Unfortunately she has missed her last 2 clinic appointments. She was last seen 3 weeks ago. She did her own compression wrap with Kerlix and Coban yesterday after seeing  vein and vascular. She has not been dressing her right great toe wound. She currently denies signs of infection. 11/30; patient presents for 1 week follow-up. She states she changed her dressing last week prior to home health and use sorb act with Dakin's and Hydrofera Blue. Home health has changed the dressing as well and they have been using sorbact. Today she reports increased redness to her right lower extremity. She has a history of cellulitis to this leg. She has been using silver alginate to the right great toe. Unfortunately she had an episode of diarrhea prior to coming in and had feces all over the right leg and to the wrap of her left leg. 12/7; patient presents for 1 week follow-up. She states that home health did not come out to change the dressing and she took it off yesterday. It is unclear if she is dressing the right toe wound. She denies signs of infection. 12/14; patient presents for 1 week follow-up. She has no issues or complaints today. 12/21; patient presents for follow-up. She has no issues or complaints today. She denies signs of infection. 12/28/2021; patient presents for follow-up. She was hospitalized for sepsis secondary to right lower extremity cellulitis On 12/23. She states she is currently at a SNF. She states that she was started on doxycycline this morning for her right great toe swelling and redness. She is not sure what dressings have been done to her left lower extremity for the past 3 weeks. She says its been mainly gauze with an Ace wrap. 1/25;  patient presents for follow-up. She is still residing in a skilled nursing facility. She reports mild pain to the left lower extremity wound bed. She states she is going to see a podiatrist soon. 2/8; patient presents for follow-up. She has moved back to her residential community from her skilled nursing facility. She has no issues or complaints today. She denies signs of systemic infections. PAIZLEIGH, WILDS ([id number]) 2/15; patient presents for follow-up. He has no issues or complaints today. She denies systemic signs of infection. 2/22; patient presents for follow-up. She has no issues or complaints today. She denies signs of infection. 3/1; patient presents for follow-up. She states that home health came out the day after she was seen in our clinic and yesterday to do the wrap change. She denies signs of infection. She reports excoriated skin on the ankle. 3/8; patient presents for follow-up. She has no issues or complaints today. She denies signs of infection. 3/15; patient presents for follow-up. Home health has been coming out to change the dressings. She reports more tenderness to the wound site. She denies purulent drainage, increased warmth or erythema to the area. 4/5; patient presents for follow-up. She has missed her last 2 clinic appointments. I have not seen her in 3 weeks. She was recently hospitalized for altered mental status. She was involuntarily committed. She was evaluated by psychiatry and deemed to have competency. There was no specific cause of her altered mental status. It was concluded that her physical and mental health were declining due to her chronic medical conditions. Currently home health has been coming out for dressing changes. Patient has also been doing her own dressing changes. She reports more skin breakdown to the periwound and now has a new wound. She denies fever/chills. She reports continued tenderness to the wound site. 4/12; patient with significant  venous insufficiency and a large wound on her left lower leg taking up about 80% of the circumference of her  lower leg. Cultures of this grew MRSA and Pseudomonas. She had completed a course of ciprofloxacin now is starting doxycycline. She has been using Dakin's wet-to-dry and a Tubigrip. She has home health twice a week and we change it once. 4/19; patient presents for follow-up. She completed her course of doxycycline. She has been using Dakin's wet-to-dry dressing and Tubigrip. Home health changes the dressing twice weekly. Currently she has no issues or complaints. 4/26; patient presents for follow-up. At last clinic visit orders for home health were Iodosorb under compression therapy. Unfortunately they did not have the dressing and have been using Dakin's and gentamicin under the wrap. Patient currently denies signs of infection. She has no issues or complaints today. 5/3; patient presents for follow-up. Again Iodosorb has not been used under the compression therapy when home health comes out to change the wrap and dressing. They have been using Sorbact. It is unclear why this is happening since we send orders weekly to the agency. She denies signs of infection. Patient has not purchased the [place] antibiotics. We reached out to the company and they said they have been trying to contact her on a regular basis. We gave the patient the number to call to order the medication. 5/10; patient presents for follow-up. She has no issues or complaints today. Again home health has not been using Iodosorb. Mepilex was on the wound bed. No other dressings noted. She brought in her Keystone antibiotics. She denies signs of infection. 5/17; patient presents for follow-up. Home health has come out twice since she was last seen. Joint well she has been using Keystone antibiotic with Sorbact under the compression wrap. She has no issues or complaints today. She denies signs of infection. 5/24; patient  presents for follow-up. We have been using Keystone antibiotics with Sorbact under compression therapy. She is tolerating the treatment well. She is reporting improvement in wound healing. She denies signs of infection. 5/31; patient presents for follow-up. We continue to do [facility] antibiotics with Sorbact under compression therapy. She continues to report improvement in wound healing. Home health comes out and changes the dressing once weekly. Objective Constitutional Vitals Time Taken: 10:30 AM, Height: 66 in, Source: Stated, Weight: 153 lbs, BMI: 24.7, Temperature: 97.8 F, Pulse: 67 bpm, Respiratory Rate: 18 breaths/min, Blood Pressure: 140/65 mmHg. General Notes: Left lower extremity: 1 large wound with granulation tissue, slough and biofilm. No surrounding signs of infection. Integumentary (Hair, Skin) Wound #1 status is Open. Original cause of wound was Gradually Appeared. The date acquired was: 04/06/2021. The wound has been in treatment 44 weeks. The wound is located on the Left,Medial Lower Leg. The wound measures 7cm length x 11cm width x 0.2cm depth; 60.476cm^2 area and 12.095cm^3 volume. There is Fat Layer (Subcutaneous Tissue) exposed. There is no tunneling or undermining noted. There is a medium amount of serosanguineous drainage noted. The wound margin is flat and intact. There is medium (34-66%) red, pink granulation within the wound bed. There is a medium (34-66%) amount of necrotic tissue within the wound bed including Adherent Slough. 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 She had 3 previous wounds and now has 1 wound left.  It appears well-healing. I debrided nonviable tissue. No surrounding signs of soft tissue infection. I recommended continuing compression therapy along with Keystone and Sorbact. Follow-up in 1 week. 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 There was a Excisional Skin/Subcutaneous Tissue Debridement with a total area of 77 sq cm performed by Kalman Shan, MD. With the following instrument(s): Curette to remove Viable and Non-Viable tissue/material. Material removed includes Subcutaneous Tissue and Slough and after achieving pain control using Lidocaine 4% Topical Solution. No specimens were taken. A time out was conducted at 11:20, prior to the start of the procedure. A Minimum amount of bleeding was controlled with Pressure. The procedure was tolerated well with a pain level of 0 throughout and a pain level of 0 following the procedure. Post Debridement Measurements: 7cm length x 11cm width x 0.2cm depth; 12.095cm^3 volume. Character of Wound/Ulcer Post Debridement is improved. Severity of Tissue Post Debridement is: Fat layer exposed. Post procedure Diagnosis Wound #1: Same as Pre-Procedure Plan Follow-up Appointments: Return Appointment in 1 week. Nurse Visit as needed Home Health: [place]: - [place] for wound care. May utilize formulary equivalent dressing for wound treatment orders unless otherwise specified. Home Health Nurse may visit [facility] to address patient s wound care needs. **Please direct any NON-WOUND related issues/requests for orders to patient's Primary Care Physician. **If current dressing causes regression in wound condition, may D/C ordered dressing product/s and apply Normal Saline Moist Dressing daily until next [place] or Other MD appointment. **Notify [facility] of regression in wound condition at [phone]. Other Home Health Orders/Instructions: - Dressing change 3 x weekly, once by wound care and once at wound clinic weekly. PLEASE make sure frequency between dressing changes is appropriate. Bathing/ Shower/ Hygiene: May shower with wound dressing protected with water repellent cover or cast protector. No tub bath. Anesthetic (Use 'Patient Medications' Section for Anesthetic Order Entry): Lidocaine applied to wound bed Edema Control - Lymphedema / Segmental Compressive Device / Other: Optional: One layer of unna paste to top of compression wrap (to act as an anchor). - PLEASE when applying wrap start from toes and go up to just below knee Elevate, Exercise Daily and Avoid Standing for Long Periods of Time. Elevate legs to the level of the heart and pump ankles as often as possible Elevate leg(s) parallel to the floor when sitting. DO YOUR BEST to sleep in the bed at night. DO NOT sleep in your recliner. Long hours of sitting in a recliner leads to swelling of the legs and/or potential wounds on your backside. Additional Orders / Instructions: Follow Nutritious Diet and Increase Protein Intake Medications-Please add to medication list.: P.O. Antibiotics - continue as prescribed WOUND #1: - Lower Leg Wound Laterality: Left, Medial Cleanser: Wound Cleanser (Home Health) 3 x Per Week/30 Days Discharge Instructions: Wash your hands with soap and water. Remove old dressing, discard into plastic bag and place into trash. Cleanse the wound with Wound Cleanser prior to applying a clean dressing using gauze sponges, not tissues or cotton balls. Do not scrub or use excessive force. Pat dry using gauze sponges, not tissue or cotton balls. Peri-Wound Care: Nystatin Cream USP 30 (g) 3 x Per Week/30 Days Discharge Instructions: Use Nystatin Cream as directed. MIx 1:1 with TCA cream and applied  to peri wound Peri-Wound Care: Triamcinolone Acetonide Cream, 0.1%, 15 (  g) tube 3 x Per Week/30 Days Laura Santiago, Laura Santiago ([id number]) Discharge Instructions: Apply as directed. mix 1:1 with nystatin cream for peri wound Primary Dressing: Cutimed Sorbact 1.5x 2.38 (in/in) 3 x Per Week/30 Days Discharge Instructions: A bacteria- and fungi binding wound dressing, suitable for cavities and fistulas. It is suitable as a wound filler and allows the passage of wound exudate into a secondary dressing. The dressing helps reducing odor and pain and can improve healing. Primary Dressing: keystone compound 3 x Per Week/30 Days Discharge Instructions: apply to wound bed as directed on medication label PLEASE follow direction on pill bottle for proper mixing Secondary Dressing: Zetuvit Absorbent Pad, 4x8 (in/in) 3 x Per Week/30 Days Compression Wrap: 3-LAYER WRAP - Profore Lite LF 3 Multilayer Compression Bandaging System 3 x Per Week/30 Days Discharge Instructions: Apply 3 multi-layer wrap as prescribed. 1. In office sharp debridement 2. Sorbact and Keystone antibiotic under 3 layer compression 3. Follow-up in 1 week Electronic Signature(s) Signed: 05/10/2022 12:03:52 PM By: Kalman Shan DO Entered By: Kalman Shan on 05/10/2022 11:41:51 Laura Santiago ([id number]) -------------------------------------------------------------------------------- SuperBill Details Patient Name: Laura Santiago. Date of Service: 05/10/2022 Medical Record Number: [id number] Patient Account Number: [id number] Date of Birth/Sex: 03-Jul-1946 (76 y.o. F) Treating RN: Primary Care Provider: [facility] Other Clinician: Referring Provider: [facility] Treating Provider/Extender: Yaakov Guthrie in Treatment: 44 Diagnosis Coding ICD-10 Codes Code Description [phone] 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.312 Chronic venous hypertension  (idiopathic) with ulcer of left lower extremity I87.2 Venous insufficiency (chronic) (peripheral) Z79.01 Long term (current) use of anticoagulants I10 Essential (primary) hypertension 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 Facility Procedures CPT4 Code: [id number] Description: 67544 - DEB SUBQ TISSUE 20 SQ CM/< Modifier: Quantity: 1 CPT4 Code: Description: ICD-10 Diagnosis Description L97.822 Non-pressure chronic ulcer of other part of left lower leg with fat layer I87.312 Chronic venous hypertension (idiopathic) with ulcer of left lower extremi Modifier: exposed ty Quantity: CPT4 Code: [id number] Description: 21975 - DEB SUBQ TISS EA ADDL 20CM Modifier: Quantity: 3 CPT4 Code: Description: ICD-10 Diagnosis Description L97.822 Non-pressure chronic ulcer of other part of left lower leg with fat layer I87.312 Chronic venous hypertension (idiopathic) with ulcer of left lower extremi Modifier: exposed ty Quantity: Physician Procedures CPT4 Code: [id number] Description: 11042 - WC PHYS SUBQ TISS 20 SQ CM Modifier: Quantity: 1 CPT4 Code: Description: ICD-10 Diagnosis Description L97.822 Non-pressure chronic ulcer of other part of left lower leg with fat layer I87.312 Chronic venous hypertension (idiopathic) with ulcer of left lower extremit Modifier: exposed y Quantity: CPT4 Code: [id number] Description: 11045 - WC PHYS SUBQ TISS EA ADDL 20 CM Modifier: Quantity: 3 CPT4 Code: Description: ICD-10 Diagnosis Description L97.822 Non-pressure chronic ulcer of other part of left lower leg with fat layer I87.312 Chronic venous hypertension (idiopathic) with ulcer of left lower extremit Modifier: exposed y Quantity: Electronic Signature(s) Signed: 05/10/2022 12:03:52 PM By: Kalman Shan DO Signed: 05/10/2022 4:45:49 PM By: Alycia Rossetti Entered By: Alycia Rossetti on 05/10/2022 11:49:23

## 2022-05-10 NOTE — Progress Notes (Signed)
AREIL, OTTEY ([id number]) Visit Report for 05/10/2022 Arrival Information Details Patient Name: Laura Santiago, Laura Santiago. Date of Service: 05/10/2022 10:30 AM Medical Record Number: [id number] Patient Account Number: [id number] Date of Birth/Sex: 1946-05-14 (76 y.o. F) Treating RN: Alycia Rossetti Primary Care Malyssa Maris: [facility] Other Clinician: Referring Lovella Hardie: [facility] Treating Vickki Igou/Extender: Yaakov Guthrie in Treatment: 66 Visit Information History Since Last Visit Added or deleted any medications: No Patient Arrived: [place] Any new allergies or adverse reactions: No Arrival Time: 10:36 Had a fall or experienced change in No Accompanied By: Self activities of daily living that may affect Transfer Assistance: None risk of falls: Patient Requires Transmission-Based No Hospitalized since last visit: No Precautions: Has Dressing in Place as Prescribed: Yes Patient Has Alerts: Yes Has Compression in Place as Prescribed: Yes Patient Alerts: PT HAS SERVICE Pain Present Now: No ANIMAL ABI 07/11/21 R) 1.16 L) 1.27 Electronic Signature(s) Signed: 05/10/2022 4:45:49 PM By: Alycia Rossetti Entered By: Alycia Rossetti on 05/10/2022 10:38:59 Laura Santiago ([id number]) -------------------------------------------------------------------------------- Clinic Level of Care Assessment Details Patient Name: Laura Santiago. Date of Service: 05/10/2022 10:30 AM Medical Record Number: [id number] Patient Account Number: [id number] Date of Birth/Sex: [birth year] (76 y.o. F) Treating RN: Alycia Rossetti Primary Care Janari Yamada: [facility] Other Clinician: Referring Teon Hudnall: [facility] Treating Arica Bevilacqua/Extender: Yaakov Guthrie in Treatment: 28 Clinic Level of Care Assessment Items TOOL 1 Quantity Score []  - Use when EandM and Procedure is performed on INITIAL visit 0 ASSESSMENTS - Nursing Assessment / Reassessment []  - General Physical Exam  (combine w/ comprehensive assessment (listed just below) when performed on new 0 pt. evals) []  - 0 Comprehensive Assessment (HX, ROS, Risk Assessments, Wounds Hx, etc.) ASSESSMENTS - Wound and Skin Assessment / Reassessment []  - Dermatologic / Skin Assessment (not related to wound area) 0 ASSESSMENTS - Ostomy and/or Continence Assessment and Care []  - Incontinence Assessment and Management 0 []  - 0 Ostomy Care Assessment and Management (repouching, etc.) PROCESS - Coordination of Care []  - Simple Patient / Family Education for ongoing care 0 []  - 0 Complex (extensive) Patient / Family Education for ongoing care []  - 0 Staff obtains Programmer, systems, Records, Test Results / Process Orders []  - 0 Staff telephones HHA, Nursing Homes / Clarify orders / etc []  - 0 Routine Transfer to another Facility (non-emergent condition) []  - 0 Routine Hospital Admission (non-emergent condition) []  - 0 New Admissions / Biomedical engineer / Ordering NPWT, Apligraf, etc. []  - 0 Emergency Hospital Admission (emergent condition) PROCESS - Special Needs []  - Pediatric / Minor Patient Management 0 []  - 0 Isolation Patient Management []  - 0 Hearing / Language / Visual special needs []  - 0 Assessment of Community assistance (transportation, D/C planning, etc.) []  - 0 Additional assistance / Altered mentation []  - 0 Support Surface(s) Assessment (bed, cushion, seat, etc.) INTERVENTIONS - Miscellaneous []  - External ear exam 0 []  - 0 Patient Transfer (multiple staff / Civil Service fast streamer / Similar devices) []  - 0 Simple Staple / Suture removal (25 or less) []  - 0 Complex Staple / Suture removal (26 or more) []  - 0 Hypo/Hyperglycemic Management (do not check if billed separately) []  - 0 Ankle / Brachial Index (ABI) - do not check if billed separately Has the patient been seen at the hospital within the last three years: Yes Total Score: 0 Level Of Care: ____ Laura Santiago ([id number]) Electronic  Signature(s) Signed: 05/10/2022 4:45:49 PM By: Alycia Rossetti Entered By: Alycia Rossetti on 05/10/2022 11:49:17  Laura Santiago, Laura Santiago ([id number]) -------------------------------------------------------------------------------- Encounter Discharge Information Details Patient Name: Laura Santiago, Laura Santiago. Date of Service: 05/10/2022 10:30 AM Medical Record Number: [id number] Patient Account Number: [id number] Date of Birth/Sex: 1946-01-28 (75 y.o. F) Treating RN: Alycia Rossetti Primary Care Kensington Duerst: [facility] Other Clinician: Referring Tamyka Bezio: [facility] Treating Ambera Fedele/Extender: Yaakov Guthrie in Treatment: 23 Encounter Discharge Information Items Post Procedure Vitals Discharge Condition: Stable Temperature (F): 97.8 Ambulatory Status: Walker Pulse (bpm): 67 Discharge Destination: Home Respiratory Rate (breaths/min): 18 Transportation: Private Auto Blood Pressure (mmHg): 140/65 Accompanied By: self Schedule Follow-up Appointment: No Clinical Summary of Care: Electronic Signature(s) Signed: 05/10/2022 4:45:49 PM By: Alycia Rossetti Entered By: Alycia Rossetti on 05/10/2022 11:53:17 Laura Santiago ([id number]) -------------------------------------------------------------------------------- Lower Extremity Assessment Details Patient Name: Laura Santiago. Date of Service: 05/10/2022 10:30 AM Medical Record Number: [id number] Patient Account Number: [id number] Date of Birth/Sex: 1946-03-11 (76 y.o. F) Treating RN: Alycia Rossetti Primary Care Belva Koziel: [facility] Other Clinician: Referring Donta Fuster: [facility] Treating Janos Shampine/Extender: Yaakov Guthrie in Treatment: 44 Edema Assessment Assessed: [Left: No] [Right: No] [Left: Edema] [Right: :] Calf Left: Right: Point of Measurement: 29 cm From Medial Instep 35 cm Ankle Left: Right: Point of Measurement: 10 cm From Medial Instep 24.2 cm Vascular Assessment Pulses: Dorsalis  Pedis Palpable: [Left:Yes] Electronic Signature(s) Signed: 05/10/2022 4:45:49 PM By: Alycia Rossetti Entered By: Alycia Rossetti on 05/10/2022 10:58:19 Laura Santiago ([id number]) -------------------------------------------------------------------------------- Multi Wound Chart Details Patient Name: Laura Santiago. Date of Service: 05/10/2022 10:30 AM Medical Record Number: [id number] Patient Account Number: [id number] Date of Birth/Sex: 30-Jan-1946 (76 y.o. F) Treating RN: Alycia Rossetti Primary Care Vincenzo Stave: [facility] Other Clinician: Referring Ekin Pilar: [facility] Treating Onna Nodal/Extender: Yaakov Guthrie in Treatment: 20 Vital Signs Height(in): 66 Pulse(bpm): 16 Weight(lbs): 153 Blood Pressure(mmHg): 140/65 Body Mass Index(BMI): 24.7 Temperature(F): 97.8 Respiratory Rate(breaths/min): 18 Photos: [N/A:N/A] Wound Location: Left, Medial Lower Leg N/A N/A Wounding Event: Gradually Appeared N/A N/A Primary Etiology: Venous Leg Ulcer N/A N/A Comorbid History: Hypertension, Osteoarthritis, N/A N/A Received Chemotherapy, Received Radiation Date Acquired: 04/06/2021 N/A N/A Weeks of Treatment: 44 N/A N/A Wound Status: Open N/A N/A Wound Recurrence: No N/A N/A Clustered Wound: Yes N/A N/A Measurements L x W x D (cm) 7x11x0.2 N/A N/A Area (cm) : 60.476 N/A N/A Volume (cm) : 12.095 N/A N/A % Reduction in Area: 33.00% N/A N/A % Reduction in Volume: 33.00% N/A N/A Classification: Full Thickness Without Exposed N/A N/A Support Structures Exudate Amount: Medium N/A N/A Exudate Type: Serosanguineous N/A N/A Exudate Color: red, brown N/A N/A Wound Margin: Flat and Intact N/A N/A Granulation Amount: Medium (34-66%) N/A N/A Granulation Quality: Red, Pink N/A N/A Necrotic Amount: Medium (34-66%) N/A N/A Exposed Structures: Fat Layer (Subcutaneous Tissue): N/A N/A Yes Fascia: No Tendon: No Muscle: No Joint: No Bone: No Epithelialization: Small  (1-33%) N/A N/A Treatment Notes Electronic Signature(s) Signed: 05/10/2022 4:45:49 PM By: Alycia Rossetti Entered By: Alycia Rossetti on 05/10/2022 10:58:54 Laura Santiago ([id number]) Laura Santiago, Laura Santiago ([id number]) -------------------------------------------------------------------------------- [place]le Details Patient Name: Laura Santiago, Laura Santiago. Date of Service: 05/10/2022 10:30 AM Medical Record Number: [id number] Patient Account Number: [id number] Date of Birth/Sex: Jan 20, 1946 (76 y.o. F) Treating RN: Alycia Rossetti Primary Care Nija Koopman: [facility] Other Clinician: Referring Narely Nobles: [facility] Treating Kathern Lobosco/Extender: Yaakov Guthrie in Treatment: 42 Active Inactive Soft Tissue Infection Nursing Diagnoses: Impaired tissue integrity Potential for infection: soft tissue Goals: Patient's soft tissue infection will resolve Date Initiated: 03/15/2022 Target Resolution  Date: 04/27/2022 Goal Status: Active Signs and symptoms of infection will be recognized early to allow for prompt treatment Date Initiated: 03/15/2022 Date Inactivated: 04/26/2022 Target Resolution Date: 04/27/2022 Goal Status: Met Interventions: Assess signs and symptoms of infection every visit Treatment Activities: Culture and sensitivity : 03/15/2022 Notes: Wound/Skin Impairment Nursing Diagnoses: Impaired tissue integrity Goals: Patient/caregiver will verbalize understanding of skin care regimen Date Initiated: 07/06/2021 Date Inactivated: 07/27/2021 Target Resolution Date: 07/06/2021 Goal Status: Met Ulcer/skin breakdown will have a volume reduction of 30% by week 4 Date Initiated: 07/06/2021 Date Inactivated: 10/12/2021 Target Resolution Date: 08/06/2021 Goal Status: Unmet Unmet Reason: cont tx Ulcer/skin breakdown will have a volume reduction of 50% by week 8 Date Initiated: 07/06/2021 Target Resolution Date: 09/06/2021 Goal Status: Active Ulcer/skin breakdown will  have a volume reduction of 80% by week 12 Date Initiated: 07/06/2021 Target Resolution Date: 10/06/2021 Goal Status: Active Ulcer/skin breakdown will heal within 14 weeks Date Initiated: 07/06/2021 Target Resolution Date: 11/06/2021 Goal Status: Active Interventions: Assess patient/caregiver ability to obtain necessary supplies Assess patient/caregiver ability to perform ulcer/skin care regimen upon admission and as needed Assess ulceration(s) every visit Treatment Activities: Referred to DME Kareem Cathey for dressing supplies : 07/06/2021 Skin care regimen initiated : 07/06/2021 Notes: Laura Santiago, Laura Santiago ([id number]) Electronic Signature(s) Signed: 05/10/2022 4:45:49 PM By: Alycia Rossetti Entered By: Alycia Rossetti on 05/10/2022 10:58:40 Laura Santiago ([id number]) -------------------------------------------------------------------------------- Pain Assessment Details Patient Name: Laura Santiago. Date of Service: 05/10/2022 10:30 AM Medical Record Number: [id number] Patient Account Number: [id number] Date of Birth/Sex: 08/30/1946 (76 y.o. F) Treating RN: Alycia Rossetti Primary Care Addalyn Speedy: [facility] Other Clinician: Referring Brunette Lavalle: [facility] Treating Narek Kniss/Extender: Yaakov Guthrie in Treatment: 41 Active Problems Location of Pain Severity and Description of Pain Patient Has Paino No Site Locations Pain Management and Medication Current Pain Management: Electronic Signature(s) Signed: 05/10/2022 4:45:49 PM By: Alycia Rossetti Entered By: Alycia Rossetti on 05/10/2022 10:40:35 Laura Santiago ([id number]) -------------------------------------------------------------------------------- Patient/Caregiver Education Details Patient Name: Laura Santiago. Date of Service: 05/10/2022 10:30 AM Medical Record Number: [id number] Patient Account Number: [id number] Date of Birth/Gender: 04-12-1946 (76 y.o. F) Treating RN: Alycia Rossetti Primary Care Physician:  [facility] Other Clinician: Referring Physician: [facility] Treating Physician/Extender: Yaakov Guthrie in Treatment: 61 Education Assessment Education Provided To: Patient Education Topics Provided Wound/Skin Impairment: Methods: Demonstration, Explain/Verbal Responses: State content correctly Electronic Signature(s) Signed: 05/10/2022 4:45:49 PM By: Alycia Rossetti Entered By: Alycia Rossetti on 05/10/2022 11:50:15 Laura Santiago ([id number]) -------------------------------------------------------------------------------- Wound Assessment Details Patient Name: Laura Santiago. Date of Service: 05/10/2022 10:30 AM Medical Record Number: [id number] Patient Account Number: [id number] Date of Birth/Sex: 1946-02-22 (76 y.o. F) Treating RN: Alycia Rossetti Primary Care Davontae Prusinski: [facility] Other Clinician: Referring Sharrieff Spratlin: [facility] Treating Kohner Orlick/Extender: Yaakov Guthrie in Treatment: 44 Wound Status Wound Number: 1 Primary Venous Leg Ulcer Etiology: Wound Location: Left, Medial Lower Leg Wound Status: Open Wounding Event: Gradually Appeared Comorbid Hypertension, Osteoarthritis, Received Chemotherapy, Date Acquired: 04/06/2021 History: Received Radiation Weeks Of Treatment: 44 Clustered Wound: Yes Photos Wound Measurements Length: (cm) 7 Width: (cm) 11 Depth: (cm) 0.2 Area: (cm) 60.476 Volume: (cm) 12.095 % Reduction in Area: 33% % Reduction in Volume: 33% Epithelialization: Small (1-33%) Tunneling: No Undermining: No Wound Description Classification: Full Thickness Without Exposed Support Structures Wound Margin: Flat and Intact Exudate Amount: Medium Exudate Type: Serosanguineous Exudate Color: red, brown Foul Odor After Cleansing: No Slough/Fibrino Yes Wound Bed Granulation Amount: Medium (34-66%) Exposed Structure Granulation Quality:  Red, Pink Fascia Exposed: No Necrotic Amount: Medium  (34-66%) Fat Layer (Subcutaneous Tissue) Exposed: Yes Necrotic Quality: Adherent Slough Tendon Exposed: No Muscle Exposed: No Joint Exposed: No Bone Exposed: No Treatment Notes Wound #1 (Lower Leg) Wound Laterality: Left, Medial Cleanser Wound Cleanser Discharge Instruction: Wash your hands with soap and water. Remove old dressing, discard into plastic bag and place into trash. Cleanse the wound with Wound Cleanser prior to applying a clean dressing using gauze sponges, not tissues or cotton balls. Do not scrub or use excessive force. Pat dry using gauze sponges, not tissue or cotton balls. Laura Santiago, Laura Santiago ([id number]) Peri-Wound Care Nystatin Cream USP 30 (g) Discharge Instruction: Use Nystatin Cream as directed. MIx 1:1 with TCA cream and applied to peri wound Triamcinolone Acetonide Cream, 0.1%, 15 (g) tube Discharge Instruction: Apply as directed. mix 1:1 with nystatin cream for peri wound Topical Primary Dressing Cutimed Sorbact 1.5x 2.38 (in/in) Discharge Instruction: A bacteria- and fungi binding wound dressing, suitable for cavities and fistulas. It is suitable as a wound filler and allows the passage of wound exudate into a secondary dressing. The dressing helps reducing odor and pain and can improve healing. keystone compound Discharge Instruction: apply to wound bed as directed on medication label PLEASE follow direction on pill bottle for proper mixing Secondary Dressing Zetuvit Absorbent Pad, 4x8 (in/in) Secured With Compression Wrap 3-LAYER WRAP - Profore Lite LF 3 Multilayer Compression Bandaging System Discharge Instruction: Apply 3 multi-layer wrap as prescribed. Compression Stockings Add-Ons Electronic Signature(s) Signed: 05/10/2022 4:45:49 PM By: Alycia Rossetti Entered By: Alycia Rossetti on 05/10/2022 10:55:35 Laura Santiago ([id number]) -------------------------------------------------------------------------------- Vitals Details Patient Name: Laura Santiago. Date of Service: 05/10/2022 10:30 AM Medical Record Number: [id number] Patient Account Number: [id number] Date of Birth/Sex: [birth year] (76 y.o. F) Treating RN: Alycia Rossetti Primary Care Alli Jasmer: [facility] Other Clinician: Referring Mckayla Mulcahey: [facility] Treating Keidra Withers/Extender: Yaakov Guthrie in Treatment: 27 Vital Signs Time Taken: 10:30 Temperature (F): 97.8 Height (in): 66 Pulse (bpm): 67 Source: Stated Respiratory Rate (breaths/min): 18 Weight (lbs): 153 Blood Pressure (mmHg): 140/65 Body Mass Index (BMI): 24.7 Reference Range: 80 - 120 mg / dl Electronic Signature(s) Signed: 05/10/2022 4:45:49 PM By: Alycia Rossetti Entered By: Alycia Rossetti on 05/10/2022 10:40:28

## 2022-05-16 ENCOUNTER — Inpatient Hospital Stay: Payer: Medicare Other | Attending: Oncology

## 2022-05-16 ENCOUNTER — Ambulatory Visit: Payer: Medicare Other

## 2022-05-16 ENCOUNTER — Telehealth: Payer: Self-pay | Admitting: Medical Oncology

## 2022-05-16 ENCOUNTER — Inpatient Hospital Stay (HOSPITAL_BASED_OUTPATIENT_CLINIC_OR_DEPARTMENT_OTHER): Payer: Medicare Other | Admitting: Medical Oncology

## 2022-05-16 VITALS — BP 154/76 | HR 72 | Temp 97.6°F | Resp 18

## 2022-05-16 VITALS — BP 135/72 | HR 72 | Temp 98.1°F | Resp 17 | Wt 144.5 lb

## 2022-05-16 DIAGNOSIS — C50919 Malignant neoplasm of unspecified site of unspecified female breast: Secondary | ICD-10-CM

## 2022-05-16 DIAGNOSIS — C7951 Secondary malignant neoplasm of bone: Secondary | ICD-10-CM | POA: Insufficient documentation

## 2022-05-16 DIAGNOSIS — Z5111 Encounter for antineoplastic chemotherapy: Secondary | ICD-10-CM | POA: Diagnosis present

## 2022-05-16 DIAGNOSIS — C50911 Malignant neoplasm of unspecified site of right female breast: Secondary | ICD-10-CM | POA: Insufficient documentation

## 2022-05-16 DIAGNOSIS — Z5112 Encounter for antineoplastic immunotherapy: Secondary | ICD-10-CM | POA: Diagnosis not present

## 2022-05-16 DIAGNOSIS — C778 Secondary and unspecified malignant neoplasm of lymph nodes of multiple regions: Secondary | ICD-10-CM | POA: Diagnosis not present

## 2022-05-16 DIAGNOSIS — M25511 Pain in right shoulder: Secondary | ICD-10-CM

## 2022-05-16 DIAGNOSIS — Z170 Estrogen receptor positive status [ER+]: Secondary | ICD-10-CM | POA: Diagnosis not present

## 2022-05-16 DIAGNOSIS — C787 Secondary malignant neoplasm of liver and intrahepatic bile duct: Secondary | ICD-10-CM | POA: Diagnosis not present

## 2022-05-16 MED ORDER — TRASTUZUMAB-DKST CHEMO 150 MG IV SOLR
6.0000 mg/kg | Freq: Once | INTRAVENOUS | Status: AC
  Administered 2022-05-16: 420 mg via INTRAVENOUS
  Filled 2022-05-16: qty 20

## 2022-05-16 MED ORDER — HEPARIN SOD (PORK) LOCK FLUSH 100 UNIT/ML IV SOLN
500.0000 [IU] | Freq: Once | INTRAVENOUS | Status: AC | PRN
  Filled 2022-05-16: qty 5

## 2022-05-16 MED ORDER — SODIUM CHLORIDE 0.9 % IV SOLN
420.0000 mg | Freq: Once | INTRAVENOUS | Status: AC
  Administered 2022-05-16: 420 mg via INTRAVENOUS
  Filled 2022-05-16: qty 14

## 2022-05-16 MED ORDER — DIPHENHYDRAMINE HCL 25 MG PO CAPS
50.0000 mg | ORAL_CAPSULE | Freq: Once | ORAL | Status: AC
  Administered 2022-05-16: 50 mg via ORAL
  Filled 2022-05-16: qty 2

## 2022-05-16 MED ORDER — HEPARIN SOD (PORK) LOCK FLUSH 100 UNIT/ML IV SOLN
INTRAVENOUS | Status: AC
  Administered 2022-05-16: 500 [IU]
  Filled 2022-05-16: qty 5

## 2022-05-16 MED ORDER — SODIUM CHLORIDE 0.9 % IV SOLN
Freq: Once | INTRAVENOUS | Status: AC
  Filled 2022-05-16: qty 250

## 2022-05-16 MED ORDER — ACETAMINOPHEN 325 MG PO TABS
650.0000 mg | ORAL_TABLET | Freq: Once | ORAL | Status: AC
  Administered 2022-05-16: 650 mg via ORAL
  Filled 2022-05-16: qty 2

## 2022-05-16 NOTE — Progress Notes (Signed)
Returns for follow-up and D1C15 Herceptin/Perjeta. She would like to know the results of her bone scan today. She reports that the contrast for the bone scan causes severe nausea. She would like to know if there are any other imaging alternatives. She is aware insurance does not cover a PET.

## 2022-05-16 NOTE — Patient Instructions (Signed)
[facility] [place]  Discharge Instructions: Thank you for choosing [facility] to provide your oncology and hematology care.  If you have a lab appointment with the [facility], please go directly to the [facility] and check in at the registration area.  Wear comfortable clothing and clothing appropriate for easy access to any Portacath or PICC line.   We strive to give you quality time with your provider. You may need to reschedule your appointment if you arrive late (15 or more minutes).  Arriving late affects you and other patients whose appointments are after yours.  Also, if you miss three or more appointments without notifying the office, you may be dismissed from the clinic at the provider's discretion.      For prescription refill requests, have your pharmacy contact our office and allow 72 hours for refills to be completed.    Today you received the following chemotherapy and/or immunotherapy agents Herceptin & Perjeta      To help prevent nausea and vomiting after your treatment, we encourage you to take your nausea medication as directed.  BELOW ARE SYMPTOMS THAT SHOULD BE REPORTED IMMEDIATELY: *FEVER GREATER THAN 100.4 F (38 C) OR HIGHER *CHILLS OR SWEATING *NAUSEA AND VOMITING THAT IS NOT CONTROLLED WITH YOUR NAUSEA MEDICATION *UNUSUAL SHORTNESS OF BREATH *UNUSUAL BRUISING OR BLEEDING *URINARY PROBLEMS (pain or burning when urinating, or frequent urination) *BOWEL PROBLEMS (unusual diarrhea, constipation, pain near the anus) TENDERNESS IN MOUTH AND THROAT WITH OR WITHOUT PRESENCE OF ULCERS (sore throat, sores in mouth, or a toothache) UNUSUAL RASH, SWELLING OR PAIN  UNUSUAL VAGINAL DISCHARGE OR ITCHING   Items with * indicate a potential emergency and should be followed up as soon as possible or go to the Emergency Department if any problems should occur.  Please show the CHEMOTHERAPY ALERT CARD or IMMUNOTHERAPY ALERT CARD at  check-in to the Emergency Department and triage nurse.  Should you have questions after your visit or need to cancel or reschedule your appointment, please contact [facility] [place]  [phone] and follow the prompts.  Office hours are 8:00 a.m. to 4:30 p.m. Monday - Friday. Please note that voicemails left after 4:00 p.m. may not be returned until the following business day.  We are closed weekends and major holidays. You have access to a nurse at all times for urgent questions. Please call the main number to the clinic [phone] and follow the prompts.  For any non-urgent questions, you may also contact your provider using MyChart. We now offer e-Visits for anyone 53 and older to request care online for non-urgent symptoms. For details visit [email or web address].   Also download the MyChart app! Go to the app store, search "MyChart", open the app, select , and log in with your MyChart username and password.  Due to Covid, a mask is required upon entering the hospital/clinic. If you do not have a mask, one will be given to you upon arrival. For doctor visits, patients may have 1 support person aged 40 or older with them. For treatment visits, patients cannot have anyone with them due to current Covid guidelines and our immunocompromised population.

## 2022-05-16 NOTE — Telephone Encounter (Signed)
LVM to give call back to R/S pt appt per provider req.Marland KitchenKJ

## 2022-05-16 NOTE — Progress Notes (Signed)
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  She had a screening mammogram in September 2014 which showed 2.1 x 2.3 x 1.8 cm irregular mass in her right breast.  It was ER 10% positive PR negative and HER2 positive +3.  She received neoadjuvant chemotherapy with Taxol Herceptin and Perjeta for 4 cycles followed by dose dense AC/Herceptin x4 which she completed in March 2015.  She had a right lumpectomy on 04/03/2014 which showed scant residual invasive ductal carcinoma [id number].  She completed 1 year of adjuvant Herceptin chemotherapy and also completed adjuvant radiation treatment.  She was recommended anastrozole which she took on and off starting November 2015 and stopped sometime in 2020.   She was then hospitalized with neck pain and was found to have lytic lesions involving C5-C6 with pathological vertebral fractures.  She underwent radiation treatment to this area.  Image guided biopsy of the L1 vertebral body showed metastatic carcinoma consistent with breast origin ER 10% PR 0% and HER2 amplified ratio 5.1 average HER2 signal number per cell 15.0 average CEP 17 signals number per cell 3.0.  Baseline echocardiogram on 05/02/2021 showed a normal EF of 62% she was recommended Taxol  Herceptin and Perjeta which she received for 2 cycles [facility] until June 10, 2021   She has chronic pain from her bone metastases for which she is currently on oxycodone 10 mg every 4 hours as needed and 12 mcg fentanyl patch.  She was seeing pain clinic when she was living in [name].   Patient has also been on Zometa when she was in [name] but she does have some ongoing dental issues.  She has received Xgeva in the past as well.  Her last Delton See was in July 2021.  Last PET scan was on 03/21/2021 which showed diffuse osseous metastatic disease involving the head neck chest abdomen and pelvis and spine.  Left lung apex hypermetabolic nodule and multiple hypermetabolic liver lesions concerning for disease involvement.  Enlarged hypermetabolic left inguinal lymph nodes along with hypermetabolic external iliac and left supraclavicular lymph nodes   Patient is now moved to [place] to be close to her daughter.  She lives in an independent living. Currently patient is getting Taxol Herceptin and Perjeta every 3 weeks with weekly Taxol  Interval history-patient reports that overall she is doing well but has a few concerns today.  Her first concern is that she states that immediately after her bone scans she has vomiting, nausea and diarrhea that last few hours.  This is the third time this is occurred and wants to ask if she can cancel any future bone scans and do a PET scan instead.  Her next question is if it would be okay for her to either start PT or go to her communities Senior exercise classes to help with her loss of  strength.  Her third concern is a question regarding who is managing her pain medications as she states that she has palliative care through [facility] as well as palliative care through our office via Dr. Janese Banks and Altha Harm. She is unsure if she should have this many people involved in her care. She states that she would like to discuss with them an alternative for the fentanyl patches as she  states that they are expensive, occasionally fall off and that she is having some difficulty with feeling that the backing off of them.  She also mentions that her granddaughter is very concerned about her wearing the patches around young children as they read an article about a infant who came into contact with a fentanyl patch that had fallen off.  She states that she tries to keep her patch is safe.   At her last visit she mentions some right shoulder pain and had an x-ray which showed a previous potential lytic lesion but this appeared stable and she did have a significant amount of osteoarthritis.  Her pain medication was adjusted from oxycodone 10 mg to oxycodone 15 mg.  She states that the pain is not causing her much trouble anymore although she does feel that the oxycodone 10 mg was working better than her current pain medication.   She maintains that she has a good appetite and denies any recent fevers, night sweats or other intolerance.  She would like to proceed forward with treatment today- Cycle 16.   ECOG PS- 2 Pain scale- 5 Opioid associated constipation- no  Review of systems- Review of Systems  Constitutional:  Positive for malaise/fatigue. Negative for chills, fever and weight loss.  HENT:  Negative for congestion, ear discharge and nosebleeds.   Eyes:  Negative for blurred vision.  Respiratory:  Negative for cough, hemoptysis, sputum production, shortness of breath and wheezing.   Cardiovascular:  Negative for chest pain, palpitations, orthopnea and claudication.  Gastrointestinal:  Negative for abdominal pain, blood in stool, constipation, diarrhea, heartburn, melena, nausea and vomiting.  Genitourinary:  Negative for dysuria, flank pain, frequency, hematuria and urgency.  Musculoskeletal:  Negative for back pain, joint pain and myalgias.  Skin:  Negative for rash.  Neurological:  Negative for dizziness, tingling, focal weakness, seizures, weakness and headaches.   Endo/Heme/Allergies:  Does not bruise/bleed easily.  Psychiatric/Behavioral:  Negative for depression and suicidal ideas. The patient does not have insomnia.      Allergies  Allergen Reactions   Technetium Tc 40m Medronate Diarrhea and Nausea And Vomiting    Pt has had episodes of nausea, vomiting and diarrhea right after receiving IV technetium for NM study on two occasions.    Corticosteroids Other (See Comments)    Pt trf from [name] and per primary md for her cancer tx. Notes that it causes agitation intolerance   Sulfa Antibiotics Other (See Comments)    Pt moved from [name] and in MD notes she has allergy but we do not know reactions when taking the drug   Celebrex [Celecoxib] Rash     Past Medical History:  Diagnosis Date   ADHD (attention deficit hyperactivity disorder)    in [place], no date on md note   Anemia    IDA 11/26/2019, Anemia in stage 4 chronic kidney disease ([place]) 09/03/2020   Arthritis    osteoarthritis right knee 09/30/2014   Breast cancer ([place]) 10/05/2013   in [place] +, [place] -, Her 2 is 3+   Cancer related pain 03/28/2021  in [name], md notes spine mets   cervical compression fracture 03/18/2021   in [place]   DVT of lower extremity, bilateral ([place]) 03/30/2014   in [name]   Generalized muscle weakness 03/31/2016   in [name]   GERD (gastroesophageal reflux disease) 05/15/2013   per md in [name]   Hyperparathyroidism, secondary ([place]) 04/25/2018   in [name]   Hypertension 02/20/2016   info from MD in [place] loss 02/05/2015   in [name]   Metabolic acidosis 93/73/4287   in [place]   Metabolic syndrome 68/10/5725   in [name]   Osteopenia after menopause 03/29/2016   in [name]   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   in [name]   Stasis ulcer of left lower extremity ([place]) 03/29/2016   in [name]     Past Surgical History:  Procedure Laterality Date   CESAREAN SECTION     unknown   fibroid removed  N/A    in [place] - unknown date   IR FLUORO GUIDE CV LINE LEFT   07/27/2021   IR PORT REPAIR CENTRAL VENOUS ACCESS DEVICE Left    In [place] N/A 02/25/2014   in [name]   ovary removed      unknown   PORTA CATH INSERTION N/A 02/13/2022   Procedure: PORTA CATH INSERTION;  Surgeon: Algernon Huxley, MD;  Location: [facility];  Service: Cardiovascular;  Laterality: N/A;   [place] CATARACT EXTRACAP,INSERT LENS Bilateral  Bilateral 09/05/2012   in [place]     unknown    LUMPECTOMY Right 04/03/2014   in [place]    Social History   Socioeconomic History   Marital status: Divorced    Spouse name: Not on file   Number of children: Not on file   Years of education: Not on file   Highest education level: Not on file  Occupational History   Occupation: retired Teacher, music    Comment: In [place]  Tobacco Use   Smoking status: Never   Smokeless tobacco: Never  Vaping Use   Vaping Use: Never used  Substance and Sexual Activity   Alcohol use: Not Currently   Drug use: Yes    Comment: prescribed oxy, fentanyl patch   Sexual activity: Not Currently  Other Topics Concern   Not on file  Social History Narrative   Not on file   Social Determinants of Health   Financial Resource Strain: Low Risk    Difficulty of Paying Living Expenses: Not hard at all  Food Insecurity: No Food Insecurity   Worried About Charity fundraiser in the Last Year: Never true   [place] in the Last Year: Never true  Transportation Needs: No Transportation Needs   Lack of Transportation (Medical): No   Lack of Transportation (Non-Medical): No  Physical Activity: Inactive   Days of Exercise per Week: 0 days   Minutes of Exercise per Session: 0 min  Stress: No Stress Concern Present   Feeling of Stress : Only a little  Social Connections: Socially Isolated   Frequency of Communication with Friends and Family: Twice a week   Frequency of Social Gatherings with Friends and Family: Twice a week   Attends Religious Services: Never    Marine scientist or Organizations: No   Attends Archivist Meetings: Never   Marital Status: Widowed  Intimate Partner Violence: Not At Risk   Fear of Current or Ex-Partner: No  Emotionally Abused: No   Physically Abused: No   Sexually Abused: No    Family History  Problem Relation Age of Onset   Pancreatic cancer Mother    Stroke Father    Diabetes Father    Hypertension Father    Heart disease Father    Skin cancer Father    Varicose Veins Father    Skin cancer Brother    Cancer - Prostate Brother      Current Outpatient Medications:    acetaminophen (TYLENOL) 500 MG tablet, Take 500 mg by mouth every 4 (four) hours as needed., Disp: , Rfl:    amphetamine-dextroamphetamine (ADDERALL XR) 30 MG 24 hr capsule, Take 1 capsule (30 mg total) by mouth daily., Disp: 15 capsule, Rfl: 0   calcium citrate (CALCITRATE - DOSED IN MG ELEMENTAL CALCIUM) 950 (200 Ca) MG tablet, Take 200 mg of elemental calcium by mouth daily., Disp: , Rfl:    DULoxetine (CYMBALTA) 30 MG capsule, Take by mouth., Disp: , Rfl:    fentaNYL (DURAGESIC) 12 MCG/HR, Place 2 patches onto the skin every 3 (three) days. (Patient not taking: Reported on 04/25/2022), Disp: 4 patch, Rfl: 0   fentaNYL (DURAGESIC) 25 MCG/HR, PLACE 1 PATCH ONTO THE SKIN EVERY THREE DAYS AS DIRECTED., Disp: 10 patch, Rfl: 0   gabapentin (NEURONTIN) 100 MG capsule, Take by mouth., Disp: , Rfl:    gentamicin cream (GARAMYCIN) 0.1 %, SMARTSIG:1 Application Topical (Patient not taking: Reported on 03/14/2022), Disp: , Rfl:    hydrOXYzine (ATARAX) 25 MG tablet, Take by mouth., Disp: , Rfl:    lisinopril (ZESTRIL) 20 MG tablet, Take 1 tablet (20 mg total) by mouth daily., Disp: 30 tablet, Rfl: 3   LORazepam (ATIVAN) 0.5 MG tablet, Take 1 tablet (0.5 mg total) by mouth daily as needed for anxiety., Disp: 30 tablet, Rfl: 0   Multiple Vitamin (MULTI-VITAMIN) tablet, Take 1 tablet by mouth daily., Disp: , Rfl:    naloxone (NARCAN) nasal  spray 4 mg/0.1 mL, SPRAY 1 SPRAY INTO ONE NOSTRIL AS DIRECTED FOR OPIOID OVERDOSE (TURN PERSON ON SIDE AFTER DOSE. IF NO RESPONSE IN 2-3 MINUTES OR PERSON RESPONDS BUT RELAPSES, REPEAT USING A NEW SPRAY DEVICE AND SPRAY INTO THE OTHER NOSTRIL. CALL 911 AFTER USE.) * EMERGENCY USE ONLY * (Patient not taking: Reported on 03/14/2022), Disp: 1 each, Rfl: 0   ondansetron (ZOFRAN) 8 MG tablet, Take by mouth., Disp: , Rfl:    oxyCODONE (ROXICODONE) 15 MG immediate release tablet, Take 1 tablet (15 mg total) by mouth every 4 (four) hours as needed for pain., Disp: 90 tablet, Rfl: 0   rivaroxaban (XARELTO) 20 MG TABS tablet, Take 1 tablet (20 mg total) by mouth daily with supper., Disp: 30 tablet, Rfl: 3   sodium hypochlorite (DAKIN'S 1/4 STRENGTH) 0.125 % SOLN, Apply 1 application topically as directed. Moisten gauze with solution and wrap wound, Disp: , Rfl:  No current facility-administered medications for this visit.  Facility-Administered Medications Ordered in Other Visits:    heparin lock flush 100 UNIT/ML injection, , , ,    heparin lock flush 100 UNIT/ML injection, , , ,    heparin lock flush 100 unit/mL, 500 Units, Intracatheter, Once PRN, [name], MD   pertuzumab (PERJETA) 420 mg in sodium chloride 0.9 % 250 mL chemo infusion, 420 mg, Intravenous, Once, [name], MD   sodium chloride flush (NS) 0.9 % injection 10 mL, 10 mL, Intracatheter, PRN, [name], MD   trastuzumab-dkst (OGIVRI) 420 mg  in sodium chloride 0.9 % 250 mL chemo infusion, 6 mg/kg (Treatment Plan Recorded), Intravenous, Once, [name], MD  Physical exam:  Vitals:   05/16/22 0853  BP: 135/72  Pulse: 72  Resp: 17  Temp: 98.1 F (36.7 C)  SpO2: 100%  Weight: 144 lb 8 oz (65.5 kg)   Physical Exam Constitutional:      General: She is not in acute distress.    Appearance: Normal appearance.     Comments: Frail- using rolling walker without excessive weakness  Cardiovascular:     Rate and Rhythm:  Normal rate and regular rhythm.     Heart sounds: Normal heart sounds.  Pulmonary:     Effort: Pulmonary effort is normal.     Breath sounds: Normal breath sounds.  Abdominal:     General: Bowel sounds are normal.     Palpations: Abdomen is soft.  Musculoskeletal:     Comments: Limited range of motion in the right shoulder  Skin:    General: Skin is warm and dry.  Neurological:     Mental Status: She is alert and oriented to person, place, and time.        Latest Ref Rng & Units 04/25/2022    8:12 AM  CMP  Glucose 70 - 99 mg/dL 122    BUN 8 - 23 mg/dL 19    Creatinine 0.44 - 1.00 mg/dL 1.41    Sodium 135 - 145 mmol/L 137    Potassium 3.5 - 5.1 mmol/L 3.9    Chloride 98 - 111 mmol/L 104    CO2 22 - 32 mmol/L 25    Calcium 8.9 - 10.3 mg/dL 8.3    Total Protein 6.5 - 8.1 g/dL 7.1    Total Bilirubin 0.3 - 1.2 mg/dL 0.6    Alkaline Phos 38 - 126 U/L 73    AST 15 - 41 U/L 17    ALT 0 - 44 U/L 11        Latest Ref Rng & Units 04/25/2022    8:12 AM  CBC  WBC 4.0 - 10.5 K/uL 6.2    Hemoglobin 12.0 - 15.0 g/dL 8.7    Hematocrit 36.0 - 46.0 % 28.1    Platelets 150 - 400 K/uL 257       Assessment and plan- Patient is a 76 y.o. female with metastatic weakly ER positive HER2 positive breast cancer with liver lymph node and bone metastases.  She is here for on treatment assessment prior to cycle 15 of maintenance Herceptin and Perjeta  Labs reviewed from previous visit and acceptable to proceed with cycle 16 of Herceptin/Perjeta today. She will return in 3 weeks for consideration of cycle 17 with Dr. Janese Banks.  I informed patient that I am going to discuss her concerns with the team and we will schedule a visit which is patient's preference to discuss her concerns further once I have spoken with the team.  For now I have given her the okay to try her senior exercise classes as tolerated.  In terms of her palliative care it appears that Dr. Janese Banks has taken this over however I will confirm.     Bone metastases: Corrected calcium close to 8.6 today.  I will hold off on giving her Zometa and consider giving Zometa again in 6 weeks time   Visit Diagnosis 1. Primary malignant neoplasm of breast with metastasis ([place])   2. Right shoulder pain, unspecified chronicity   3. Encounter for monoclonal antibody treatment  for malignancy   4. Carcinoma of breast metastatic to bone, unspecified laterality ([place])      Nelwyn Salisbury, PA-C [place] 05/16/2022 10:28 AM

## 2022-05-17 ENCOUNTER — Encounter: Payer: Medicare Other | Attending: Internal Medicine | Admitting: Physician Assistant

## 2022-05-17 DIAGNOSIS — L97822 Non-pressure chronic ulcer of other part of left lower leg with fat layer exposed: Secondary | ICD-10-CM | POA: Insufficient documentation

## 2022-05-17 DIAGNOSIS — Z7901 Long term (current) use of anticoagulants: Secondary | ICD-10-CM | POA: Insufficient documentation

## 2022-05-17 DIAGNOSIS — S91201D Unspecified open wound of right great toe with damage to nail, subsequent encounter: Secondary | ICD-10-CM | POA: Insufficient documentation

## 2022-05-17 DIAGNOSIS — I129 Hypertensive chronic kidney disease with stage 1 through stage 4 chronic kidney disease, or unspecified chronic kidney disease: Secondary | ICD-10-CM | POA: Diagnosis not present

## 2022-05-17 DIAGNOSIS — I87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: Secondary | ICD-10-CM | POA: Diagnosis present

## 2022-05-17 DIAGNOSIS — F909 Attention-deficit hyperactivity disorder, unspecified type: Secondary | ICD-10-CM | POA: Insufficient documentation

## 2022-05-17 DIAGNOSIS — Z86718 Personal history of other venous thrombosis and embolism: Secondary | ICD-10-CM | POA: Diagnosis not present

## 2022-05-17 DIAGNOSIS — C79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: Secondary | ICD-10-CM | POA: Diagnosis not present

## 2022-05-17 DIAGNOSIS — N184 Chronic kidney disease, stage 4 (severe): Secondary | ICD-10-CM | POA: Diagnosis not present

## 2022-05-17 DIAGNOSIS — I872 Venous insufficiency (chronic) (peripheral): Secondary | ICD-10-CM | POA: Insufficient documentation

## 2022-05-17 NOTE — Progress Notes (Signed)
Laura, Santiago ([id number]) Visit Report for 05/17/2022 Arrival Information Details Patient Name: Laura Santiago, Laura Santiago. Date of Service: 05/17/2022 9:45 AM Medical Record Number: [id number] Patient Account Number: [id number] Date of Birth/Sex: 09/03/1946 (76 y.o. F) Treating RN: Sharyn Creamer Primary Care Provider: [facility], [place] Other Clinician: Referring Provider: [facility] Treating Provider/Extender: Skipper Cliche in Treatment: 70 Visit Information History Since Last Visit Added or deleted any medications: No Patient Arrived: Ambulatory Any new allergies or adverse reactions: No Arrival Time: 10:20 Had a fall or experienced change in No Accompanied By: self activities of daily living that may affect Transfer Assistance: None risk of falls: Patient Identification Verified: Yes Signs or symptoms of abuse/neglect since last visito No Secondary Verification Process Completed: Yes Hospitalized since last visit: No Patient Requires Transmission-Based No Implantable device outside of the clinic excluding No Precautions: cellular tissue based products placed in the center Patient Has Alerts: Yes since last visit: Patient Alerts: PT HAS SERVICE Has Dressing in Place as Prescribed: Yes ANIMAL Has Compression in Place as Prescribed: Yes ABI 07/11/21 Pain Present Now: No R) 1.16 L) 1.27 Electronic Signature(s) Signed: 05/17/2022 4:42:45 PM By: Sharyn Creamer RN, BSN Entered By: Sharyn Creamer on 05/17/2022 10:20:32 Laura Santiago ([id number]) -------------------------------------------------------------------------------- Compression Therapy Details Patient Name: Laura Santiago. Date of Service: 05/17/2022 9:45 AM Medical Record Number: [id number] Patient Account Number: [id number] Date of Birth/Sex: [birth year] (76 y.o. F) Treating RN: Sharyn Creamer Primary Care Provider: [facility], [place] Other Clinician: Referring Provider: [facility] Treating  Provider/Extender: Skipper Cliche in Treatment: 45 Compression Therapy Performed for Wound Assessment: Wound #1 Left,Medial Lower Leg Performed By: Clinician Sharyn Creamer, RN Compression Type: Three Layer Post Procedure Diagnosis Same as Pre-procedure Electronic Signature(s) Signed: 05/17/2022 4:42:45 PM By: Sharyn Creamer RN, BSN Entered By: Sharyn Creamer on 05/17/2022 10:33:32 Laura Santiago, Laura Santiago ([id number]) -------------------------------------------------------------------------------- Encounter Discharge Information Details Patient Name: Laura, Santiago. Date of Service: 05/17/2022 9:45 AM Medical Record Number: [id number] Patient Account Number: [id number] Date of Birth/Sex: 1945-12-18 (76 y.o. F) Treating RN: Sharyn Creamer Primary Care Provider: [facility], [place] Other Clinician: Referring Provider: [facility] Treating Provider/Extender: Skipper Cliche in Treatment: 106 Encounter Discharge Information Items Post Procedure Vitals Discharge Condition: Stable Temperature (F): 97.7 Ambulatory Status: Ambulatory Pulse (bpm): 69 Discharge Destination: Home Respiratory Rate (breaths/min): 18 Transportation: Private Auto Blood Pressure (mmHg): 131/69 Accompanied By: self Schedule Follow-up Appointment: Yes Clinical Summary of Care: Patient Declined Electronic Signature(s) Signed: 05/17/2022 4:42:45 PM By: Sharyn Creamer RN, BSN Entered By: Sharyn Creamer on 05/17/2022 11:24:12 Laura Santiago ([id number]) -------------------------------------------------------------------------------- Lower Extremity Assessment Details Patient Name: Laura Santiago. Date of Service: 05/17/2022 9:45 AM Medical Record Number: [id number] Patient Account Number: [id number] Date of Birth/Sex: 1945/12/23 (76 y.o. F) Treating RN: Sharyn Creamer Primary Care Provider: [facility], [place] Other Clinician: Referring Provider: [facility] Treating Provider/Extender: Skipper Cliche in Treatment: 45 Edema Assessment Assessed: [Left: No] [Right: No] [Left: Edema] [Right: :] Calf Left: Right: Point of Measurement: From Medial Instep 37 cm Ankle Left: Right: Point of Measurement: From Medial Instep 22 cm Vascular Assessment Pulses: Dorsalis Pedis Palpable: [Left:Yes] Electronic Signature(s) Signed: 05/17/2022 4:42:45 PM By: Sharyn Creamer RN, BSN Entered By: Sharyn Creamer on 05/17/2022 10:17:59 Laura Santiago ([id number]) -------------------------------------------------------------------------------- Multi Wound Chart Details Patient Name: Laura Santiago. Date of Service: 05/17/2022 9:45 AM Medical Record Number: [id number] Patient Account Number: [id number] Date of Birth/Sex: 05-15-1946 (76 y.o. F) Treating RN: Sharyn Creamer Primary Care Provider:  [place] Other Clinician: Referring Provider: [facility] Treating Provider/Extender: Skipper Cliche in Treatment: 45 Vital Signs Height(in): 66 Pulse(bpm): 69 Weight(lbs): 153 Blood Pressure(mmHg): 131/69 Body Mass Index(BMI): 24.7 Temperature(F): 97.7 Respiratory Rate(breaths/min): 18 Photos: [N/A:N/A] Wound Location: Left, Medial Lower Leg N/A N/A Wounding Event: Gradually Appeared N/A N/A Primary Etiology: Venous Leg Ulcer N/A N/A Comorbid History: Hypertension, Osteoarthritis, N/A N/A Received Chemotherapy, Received Radiation Date Acquired: 04/06/2021 N/A N/A Weeks of Treatment: 45 N/A N/A Wound Status: Open N/A N/A Wound Recurrence: No N/A N/A Clustered Wound: Yes N/A N/A Measurements L x W x D (cm) 7x11x0.2 N/A N/A Area (cm) : 60.476 N/A N/A Volume (cm) : 12.095 N/A N/A % Reduction in Area: 33.00% N/A N/A % Reduction in Volume: 33.00% N/A N/A Classification: Full Thickness Without Exposed N/A N/A Support Structures Exudate Amount: Medium N/A N/A Exudate Type: Serosanguineous N/A N/A Exudate Color: red, brown N/A N/A Wound Margin: Flat and Intact  N/A N/A Granulation Amount: Medium (34-66%) N/A N/A Granulation Quality: Red, Pink N/A N/A Necrotic Amount: Medium (34-66%) N/A N/A Exposed Structures: Fat Layer (Subcutaneous Tissue): N/A N/A Yes Fascia: No Tendon: No Muscle: No Joint: No Bone: No Epithelialization: Small (1-33%) N/A N/A Treatment Notes Electronic Signature(s) Signed: 05/17/2022 4:42:45 PM By: Sharyn Creamer RN, BSN Entered By: Sharyn Creamer on 05/17/2022 10:28:27 Laura Santiago, Laura Santiago ([id number], Laura Santiago ([id number]) -------------------------------------------------------------------------------- [employer]ker Details Patient Name: Laura Santiago, PARDUE. Date of Service: 05/17/2022 9:45 AM Medical Record Number: [id number] Patient Account Number: [id number] Date of Birth/Sex: Feb 15, 1946 (76 y.o. F) Treating RN: Sharyn Creamer Primary Care Provider: [facility], [place] Other Clinician: Referring Provider: [facility] Treating Provider/Extender: Skipper Cliche in Treatment: 45 Active Inactive Soft Tissue Infection Nursing Diagnoses: Impaired tissue integrity Potential for infection: soft tissue Goals: Patient's soft tissue infection will resolve Date Initiated: 03/15/2022 Target Resolution Date: 04/27/2022 Goal Status: Active Signs and symptoms of infection will be recognized early to allow for prompt treatment Date Initiated: 03/15/2022 Date Inactivated: 04/26/2022 Target Resolution Date: 04/27/2022 Goal Status: Met Interventions: Assess signs and symptoms of infection every visit Treatment Activities: Culture and sensitivity : 03/15/2022 Notes: Wound/Skin Impairment Nursing Diagnoses: Impaired tissue integrity Goals: Patient/caregiver will verbalize understanding of skin care regimen Date Initiated: 07/06/2021 Date Inactivated: 07/27/2021 Target Resolution Date: 07/06/2021 Goal Status: Met Ulcer/skin breakdown will have a volume reduction of 30% by week 4 Date Initiated:  07/06/2021 Date Inactivated: 10/12/2021 Target Resolution Date: 08/06/2021 Goal Status: Unmet Unmet Reason: cont tx Ulcer/skin breakdown will have a volume reduction of 50% by week 8 Date Initiated: 07/06/2021 Target Resolution Date: 09/06/2021 Goal Status: Active Ulcer/skin breakdown will have a volume reduction of 80% by week 12 Date Initiated: 07/06/2021 Target Resolution Date: 10/06/2021 Goal Status: Active Ulcer/skin breakdown will heal within 14 weeks Date Initiated: 07/06/2021 Target Resolution Date: 11/06/2021 Goal Status: Active Interventions: Assess patient/caregiver ability to obtain necessary supplies Assess patient/caregiver ability to perform ulcer/skin care regimen upon admission and as needed Assess ulceration(s) every visit Treatment Activities: Referred to DME provider for dressing supplies : 07/06/2021 Skin care regimen initiated : 07/06/2021 Notes: Laura Santiago, Laura Santiago ([id number]) Electronic Signature(s) Signed: 05/17/2022 4:42:45 PM By: Sharyn Creamer RN, BSN Entered By: Sharyn Creamer on 05/17/2022 10:21:07 Laura Santiago, Laura Santiago ([id number]) -------------------------------------------------------------------------------- Pain Assessment Details Patient Name: Laura Santiago. Date of Service: 05/17/2022 9:45 AM Medical Record Number: [id number] Patient Account Number: [id number] Date of Birth/Sex: 04/08/1946 (76 y.o. F) Treating RN: Sharyn Creamer Primary Care Provider: [facility], [place] Other Clinician: Referring  Masae Lukacs: [facility], [place] Treating Alaena Strader/Extender: Skipper Cliche in Treatment: 45 Active Problems Location of Pain Severity and Description of Pain Patient Has Paino Yes Site Locations Duration of the Pain. Constant / Intermittento Intermittent Rate the pain. Current Pain Level: 0 Worst Pain Level: 5 Character of Pain Describe the Pain: Aching Pain Management and Medication Current Pain Management: Electronic Signature(s) Signed:  05/17/2022 4:42:45 PM By: Sharyn Creamer RN, BSN Entered By: Sharyn Creamer on 05/17/2022 10:18:30 Laura Santiago ([id number]) -------------------------------------------------------------------------------- Patient/Caregiver Education Details Patient Name: Laura Santiago. Date of Service: 05/17/2022 9:45 AM Medical Record Number: [id number] Patient Account Number: [id number] Date of Birth/Gender: November 24, 1946 (76 y.o. F) Treating RN: Sharyn Creamer Primary Care Physician: [facility], [place] Other Clinician: Referring Physician: [facility] Treating Physician/Extender: Skipper Cliche in Treatment: 32 Education Assessment Education Provided To: Patient Education Topics Provided Wound/Skin Impairment: Methods: Explain/Verbal Responses: State content correctly Electronic Signature(s) Signed: 05/17/2022 4:42:45 PM By: Sharyn Creamer RN, BSN Entered By: Sharyn Creamer on 05/17/2022 10:29:42 Laura Santiago, Laura Santiago ([id number]) -------------------------------------------------------------------------------- Wound Assessment Details Patient Name: Laura Santiago. Date of Service: 05/17/2022 9:45 AM Medical Record Number: [id number] Patient Account Number: [id number] Date of Birth/Sex: 06-16-1946 (76 y.o. F) Treating RN: Sharyn Creamer Primary Care Lamesha Tibbits: [facility] Other Clinician: Referring Jarod Bozzo: [facility] Treating Annamay Laymon/Extender: Skipper Cliche in Treatment: 45 Wound Status Wound Number: 1 Primary Venous Leg Ulcer Etiology: Wound Location: Left, Medial Lower Leg Wound Status: Open Wounding Event: Gradually Appeared Comorbid Hypertension, Osteoarthritis, Received Chemotherapy, Date Acquired: 04/06/2021 History: Received Radiation Weeks Of Treatment: 45 Clustered Wound: Yes Photos Wound Measurements Length: (cm) 7 Width: (cm) 11 Depth: (cm) 0.2 Area: (cm) 60.476 Volume: (cm) 12.095 % Reduction in Area: 33% % Reduction in Volume:  33% Epithelialization: Small (1-33%) Tunneling: No Undermining: No Wound Description Classification: Full Thickness Without Exposed Support Structures Wound Margin: Flat and Intact Exudate Amount: Medium Exudate Type: Serosanguineous Exudate Color: red, brown Foul Odor After Cleansing: No Slough/Fibrino Yes Wound Bed Granulation Amount: Medium (34-66%) Exposed Structure Granulation Quality: Red, Pink Fascia Exposed: No Necrotic Amount: Medium (34-66%) Fat Layer (Subcutaneous Tissue) Exposed: Yes Necrotic Quality: Adherent Slough Tendon Exposed: No Muscle Exposed: No Joint Exposed: No Bone Exposed: No Treatment Notes Wound #1 (Lower Leg) Wound Laterality: Left, Medial Cleanser Wound Cleanser Discharge Instruction: Wash your hands with soap and water. Remove old dressing, discard into plastic bag and place into trash. Cleanse the wound with Wound Cleanser prior to applying a clean dressing using gauze sponges, not tissues or cotton balls. Do not scrub or use excessive force. Pat dry using gauze sponges, not tissue or cotton balls. Laura Santiago, Laura Santiago ([id number]) Peri-Wound Care Nystatin Cream USP 30 (g) Discharge Instruction: Use Nystatin Cream as directed. MIx 1:1 with TCA cream and applied to peri wound Triamcinolone Acetonide Cream, 0.1%, 15 (g) tube Discharge Instruction: Apply as directed. mix 1:1 with nystatin cream for peri wound Topical Primary Dressing Cutimed Sorbact 1.5x 2.38 (in/in) Discharge Instruction: A bacteria- and fungi binding wound dressing, suitable for cavities and fistulas. It is suitable as a wound filler and allows the passage of wound exudate into a secondary dressing. The dressing helps reducing odor and pain and can improve healing. keystone compound Discharge Instruction: apply to wound bed as directed on medication label PLEASE follow direction on pill bottle for proper mixing Secondary Dressing Zetuvit Absorbent Pad, 4x8 (in/in) Secured  With Compression Wrap 3-LAYER WRAP - Profore Lite LF 3 Multilayer Compression Bandaging System Discharge Instruction: Apply 3  multi-layer wrap as prescribed. Compression Stockings Add-Ons Electronic Signature(s) Signed: 05/17/2022 4:42:45 PM By: Sharyn Creamer RN, BSN Entered By: Sharyn Creamer on 05/17/2022 10:17:23 Laura Santiago, Laura Santiago ([id number]) -------------------------------------------------------------------------------- Vitals Details Patient Name: Laura Santiago. Date of Service: 05/17/2022 9:45 AM Medical Record Number: [id number] Patient Account Number: [id number] Date of Birth/Sex: 11/21/1946 (76 y.o. F) Treating RN: Sharyn Creamer Primary Care Provider: [facility], [place] Other Clinician: Referring Provider: [facility] Treating Provider/Extender: Skipper Cliche in Treatment: 45 Vital Signs Time Taken: 10:20 Temperature (F): 97.7 Height (in): 66 Pulse (bpm): 69 Weight (lbs): 153 Respiratory Rate (breaths/min): 18 Body Mass Index (BMI): 24.7 Blood Pressure (mmHg): 131/69 Reference Range: 80 - 120 mg / dl Electronic Signature(s) Signed: 05/17/2022 4:42:45 PM By: Sharyn Creamer RN, BSN Entered By: Sharyn Creamer on 05/17/2022 10:20:55

## 2022-05-17 NOTE — Progress Notes (Addendum)
Laura, Santiago ([id number]) Visit Report for 05/17/2022 Chief Complaint Document Details Patient Name: Laura Santiago, Laura Santiago. Date of Service: 05/17/2022 9:45 AM Medical Record Number: [id number] Patient Account Number: [id number] Date of Birth/Sex: [birth year] (76 y.o. F) Treating RN: Primary Care Provider: [facility] Other Clinician: Referring Provider: [facility] Treating Provider/Extender: Skipper Cliche in Treatment: 73 Information Obtained from: Patient Chief Complaint Left lower extremity wound Right toe wounds Left upper lateral thigh wounds Electronic Signature(s) Signed: 05/17/2022 10:22:49 AM By: Worthy Keeler PA-C Entered By: Worthy Keeler on 05/17/2022 10:22:48 Laura Santiago ([id number]) -------------------------------------------------------------------------------- Debridement Details Patient Name: Laura Santiago. Date of Service: 05/17/2022 9:45 AM Medical Record Number: [id number] Patient Account Number: [id number] Date of Birth/Sex: December 14, 1945 (76 y.o. F) Treating RN: Sharyn Creamer Primary Care Provider: [facility] Other Clinician: Referring Provider: [facility] Treating Provider/Extender: Skipper Cliche in Treatment: 45 Debridement Performed for Wound #1 Left,Medial Lower Leg Assessment: Performed By: Physician Tommie Sams., PA-C Debridement Type: Debridement Severity of Tissue Pre Debridement: Fat layer exposed Level of Consciousness (Pre- Awake and Alert procedure): Pre-procedure Verification/Time Out Yes - 10:30 Taken: Start Time: 10:30 Pain Control: Lidocaine 4% Topical Solution Total Area Debrided (L x W): 7 (cm) x 9 (cm) = 63 (cm) Tissue and other material Slough, Subcutaneous, Slough debrided: Level: Skin/Subcutaneous Tissue Debridement Description: Excisional Instrument: Curette Bleeding: Minimum Hemostasis Achieved: Pressure Procedural Pain: 0 Post Procedural Pain: 0 Response to  Treatment: Procedure was tolerated well Level of Consciousness (Post- Awake and Alert procedure): Post Debridement Measurements of Total Wound Length: (cm) 7 Width: (cm) 11 Depth: (cm) 0.2 Volume: (cm) 12.095 Character of Wound/Ulcer Post Debridement: Improved Severity of Tissue Post Debridement: Fat layer exposed Post Procedure Diagnosis Same as Pre-procedure Electronic Signature(s) Signed: 05/17/2022 4:42:45 PM By: Sharyn Creamer RN, BSN Signed: 05/18/2022 4:11:43 PM By: Worthy Keeler PA-C Entered By: Sharyn Creamer on 05/17/2022 10:33:05 Laura Santiago ([id number]) -------------------------------------------------------------------------------- HPI Details Patient Name: Laura Santiago. Date of Service: 05/17/2022 9:45 AM Medical Record Number: [id number] Patient Account Number: [id number] Date of Birth/Sex: 03/08/1946 (75 y.o. F) Treating RN: Primary Care Provider: [facility], [place] Other Clinician: Referring Provider: [facility] Treating Provider/Extender: Skipper Cliche in Treatment: 80 History of Present Illness HPI Description: Admission 7/27 Laura Santiago is a 76 year old female with a past medical history of ADHD, metastatic breast cancer, stage IV chronic kidney disease, history of DVT on Xarelto and chronic venous insufficiency that presents to the clinic for a chronic left lower extremity wound. She recently moved to [facility] 4 days ago. She was being followed by wound care center in [name]. She reports a 10-year history of wounds to her left lower extremity that eventually do heal with debridement and compression therapy. She states that the current wound reopened 4 months ago and she is using Vaseline and Coban. She denies signs of infection. 8/3; patient presents for 1 week follow-up. She reports no issues or complaints today. She states she had vascular studies done in the last week. She denies signs of infection. She brought  her little service dog with her today. 8/17; patient presents for follow-up. She has missed her last clinic appointment. She states she took the wrap off and attempted to rewrap her leg. She is having difficulty with transportation. She has her service dog with her today. Overall she feels well and reports improvement in wound healing. She denies signs of infection. She reports owning an old Velcro  wrap compression and has this at her living facility 9/14; patient presents for follow-up. Patient states that over the past 2 to 3 weeks she developed toe wounds to her right foot. She attributes this to tight fitting shoes. She subsequently developed cellulitis in the right leg and has been treated by doxycycline by her oncologist. She reports improvement in symptoms however continues to have some redness and swelling to this leg. To the left lower extremity patient has been having her wraps changed with home health twice weekly. She states that the [facility] is not helping control the drainage. Other than that she has no issues or complaints today. She denies signs of infection to the left lower extremity. 9/21; patient presents for follow-up. She reports seeing infectious disease for her cellulitis. She reports no further management. She has home health that changes the wraps twice weekly. She has no issues or complaints today. She denies signs of infection. 10/5; patient presents for follow-up. She has no issues or complaints today. She denies signs of infection. She states that the right great toe has not been dressed by home health. 10/12; patient presents for follow-up. She has no issues or complaints today. She reports improvement in her wound healing. She has been using silver alginate to the right great toe wound. She denies signs of infection. 10/26; patient presents for follow-up. Home health did not have sorbact so they continued to use Hydrofera Blue under the wrap. She has been using  silver alginate to the great toe wound however she did not have a dressing in place today. She currently denies signs of infection. 11/2; patient presents for follow-up. She has been using sorb act under the compression wrap. She reports using silver alginate to the toe wound again she does not have a dressing in place. She currently denies signs of infection. 11/23; patient presents for follow-up. Unfortunately she has missed her last 2 clinic appointments. She was last seen 3 weeks ago. She did her own compression wrap with Kerlix and Coban yesterday after seeing vein and vascular. She has not been dressing her right great toe wound. She currently denies signs of infection. 11/30; patient presents for 1 week follow-up. She states she changed her dressing last week prior to home health and use sorb act with Dakin's and Hydrofera Blue. Home health has changed the dressing as well and they have been using sorbact. Today she reports increased redness to her right lower extremity. She has a history of cellulitis to this leg. She has been using silver alginate to the right great toe. Unfortunately she had an episode of diarrhea prior to coming in and had feces all over the right leg and to the wrap of her left leg. 12/7; patient presents for 1 week follow-up. She states that home health did not come out to change the dressing and she took it off yesterday. It is unclear if she is dressing the right toe wound. She denies signs of infection. 12/14; patient presents for 1 week follow-up. She has no issues or complaints today. 12/21; patient presents for follow-up. She has no issues or complaints today. She denies signs of infection. 12/28/2021; patient presents for follow-up. She was hospitalized for sepsis secondary to right lower extremity cellulitis On 12/23. She states she is currently at a SNF. She states that she was started on doxycycline this morning for her right great toe swelling and redness. She  is not sure what dressings have been done to her left lower extremity  for the past 3 weeks. She says its been mainly gauze with an Ace wrap. 1/25; patient presents for follow-up. She is still residing in a skilled nursing facility. She reports mild pain to the left lower extremity wound bed. She states she is going to see a podiatrist soon. 2/8; patient presents for follow-up. She has moved back to her residential community from her skilled nursing facility. She has no issues or complaints today. She denies signs of systemic infections. 2/15; patient presents for follow-up. He has no issues or complaints today. She denies systemic signs of infection. 2/22; patient presents for follow-up. She has no issues or complaints today. She denies signs of infection. Laura Santiago, Laura Santiago ([id number]) 3/1; patient presents for follow-up. She states that home health came out the day after she was seen in our clinic and yesterday to do the wrap change. She denies signs of infection. She reports excoriated skin on the ankle. 3/8; patient presents for follow-up. She has no issues or complaints today. She denies signs of infection. 3/15; patient presents for follow-up. Home health has been coming out to change the dressings. She reports more tenderness to the wound site. She denies purulent drainage, increased warmth or erythema to the area. 4/5; patient presents for follow-up. She has missed her last 2 clinic appointments. I have not seen her in 3 weeks. She was recently hospitalized for altered mental status. She was involuntarily committed. She was evaluated by psychiatry and deemed to have competency. There was no specific cause of her altered mental status. It was concluded that her physical and mental health were declining due to her chronic medical conditions. Currently home health has been coming out for dressing changes. Patient has also been doing her own dressing changes. She reports more skin breakdown to  the periwound and now has a new wound. She denies fever/chills. She reports continued tenderness to the wound site. 4/12; patient with significant venous insufficiency and a large wound on her left lower leg taking up about 80% of the circumference of her lower leg. Cultures of this grew MRSA and Pseudomonas. She had completed a course of ciprofloxacin now is starting doxycycline. She has been using Dakin's wet-to-dry and a Tubigrip. She has home health twice a week and we change it once. 4/19; patient presents for follow-up. She completed her course of doxycycline. She has been using Dakin's wet-to-dry dressing and Tubigrip. Home health changes the dressing twice weekly. Currently she has no issues or complaints. 4/26; patient presents for follow-up. At last clinic visit orders for home health were Iodosorb under compression therapy. Unfortunately they did not have the dressing and have been using Dakin's and gentamicin under the wrap. Patient currently denies signs of infection. She has no issues or complaints today. 5/3; patient presents for follow-up. Again Iodosorb has not been used under the compression therapy when home health comes out to change the wrap and dressing. They have been using Sorbact. It is unclear why this is happening since we send orders weekly to the agency. She denies signs of infection. Patient has not purchased the [place] antibiotics. We reached out to the company and they said they have been trying to contact her on a regular basis. We gave the patient the number to call to order the medication. 5/10; patient presents for follow-up. She has no issues or complaints today. Again home health has not been using Iodosorb. Mepilex was on the wound bed. No other dressings noted. She brought in her [facility] antibiotics. She  denies signs of infection. 5/17; patient presents for follow-up. Home health has come out twice since she was last seen. Joint well she has been using  Keystone antibiotic with Sorbact under the compression wrap. She has no issues or complaints today. She denies signs of infection. 5/24; patient presents for follow-up. We have been using Keystone antibiotics with Sorbact under compression therapy. She is tolerating the treatment well. She is reporting improvement in wound healing. She denies signs of infection. 5/31; patient presents for follow-up. We continue to do [facility] antibiotics with Sorbact under compression therapy. She continues to report improvement in wound healing. Home health comes out and changes the dressing once weekly. 05-17-2022 upon evaluation today patient appears to be doing better in regard to her wound especially compared to the last time I saw her. Fortunately I do think that she is seeing improvements. With that being said I do believe that she may be benefit from sharp debridement today to clear away some of the necrotic debris I discussed that with her as well. She is an amendable to that plan. Otherwise she is very pleased with how the [employer] is doing for her. Electronic Signature(s) Signed: 05/17/2022 12:57:23 PM By: Worthy Keeler PA-C Entered By: Worthy Keeler on 05/17/2022 12:57:23 Laura Santiago, Laura Santiago ([id number]) -------------------------------------------------------------------------------- Physical Exam Details Patient Name: Laura Santiago, Laura Santiago. Date of Service: 05/17/2022 9:45 AM Medical Record Number: [id number] Patient Account Number: [id number] Date of Birth/Sex: 1946/03/20 (76 y.o. F) Treating RN: Primary Care Provider: [facility] Other Clinician: Referring Provider: [facility] Treating Provider/Extender: Skipper Cliche in Treatment: 76 Constitutional Well-nourished and well-hydrated in no acute distress. Respiratory normal breathing without difficulty. Psychiatric this patient is able to make decisions and demonstrates good insight into disease process. Alert and Oriented x 3.  pleasant and cooperative. Notes Upon inspection patient's wound bed actually showed signs of good granulation and epithelization at this point. Fortunately I do not see any evidence of active infection locally or systemically which is great news I did perform debridement and postdebridement the wound bed appears to be doing much better which is awesome news. Electronic Signature(s) Signed: 05/17/2022 12:57:41 PM By: Worthy Keeler PA-C Entered By: Worthy Keeler on 05/17/2022 12:57:40 Laura Santiago ([id number]) -------------------------------------------------------------------------------- Physician Orders Details Patient Name: Laura Santiago, Laura Santiago. Date of Service: 05/17/2022 9:45 AM Medical Record Number: [id number] Patient Account Number: [id number] Date of Birth/Sex: 02-Nov-1946 (76 y.o. F) Treating RN: Sharyn Creamer Primary Care Provider: [facility], [place] Other Clinician: Referring Provider: [facility] Treating Provider/Extender: Skipper Cliche in Treatment: 57 Verbal / Phone Orders: No Diagnosis Coding ICD-10 Coding Code Description [phone] 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 I87.2 Venous insufficiency (chronic) (peripheral) Z79.01 Long term (current) use of anticoagulants I10 Essential (primary) hypertension 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 Follow-up Appointments o Return Appointment in 1 week. o Nurse Visit as needed [place] for wound care. May utilize formulary equivalent dressing for wound treatment orders unless otherwise specified. Home Health Nurse may visit PRN to address patientos wound care needs. o **Please direct any NON-WOUND related issues/requests for orders to patient's Primary Care Physician. **If current dressing causes regression  in wound condition, may D/C ordered dressing product/s and apply Normal Saline Moist Dressing daily until next [place] or Other MD appointment. **Notify [facility] of regression in wound condition at [phone]. o Other Home Health Orders/Instructions: - Dressing change 3 x weekly, once by wound care and once at wound clinic weekly. PLEASE make sure frequency between dressing changes is appropriate. Bathing/ Shower/ Hygiene o May shower with wound dressing protected with water repellent cover or cast protector. o No tub bath. Anesthetic (Use 'Patient Medications' Section for Anesthetic Order Entry) o Lidocaine applied to wound bed Edema Control - Lymphedema / Segmental Compressive Device / Other o Optional: One layer of unna paste to top of compression wrap (to act as an anchor). - PLEASE when applying wrap start from toes and go up to just below knee o Elevate, Exercise Daily and Avoid Standing for Long Periods of Time. o Elevate legs to the level of the heart and pump ankles as often as possible o Elevate leg(s) parallel to the floor when sitting. o DO YOUR BEST to sleep in the bed at night. DO NOT sleep in your recliner. Long hours of sitting in a recliner leads to swelling of the legs and/or potential wounds on your backside. Additional Orders / Instructions o Follow Nutritious Diet and Increase Protein Intake Medications-Please add to medication list. o P.O. Antibiotics - continue as prescribed Wound Treatment Wound #1 - Lower Leg Wound Laterality: Left, Medial Cleanser: Wound Cleanser (Home Health) 3 x Per Week/30 Days Discharge Instructions: Wash your hands with soap and water. Remove old dressing, discard into plastic bag and place into trash. Cleanse the wound with Wound Cleanser prior to applying a clean dressing using gauze sponges, not tissues or cotton balls. Do not scrub or use excessive force. Pat dry using gauze sponges, not  tissue or cotton balls. Peri-Wound Care: Nystatin Cream USP 30 (g) 3 x Per Week/30 Days Discharge Instructions: Use Nystatin Cream as directed. MIx 1:1 with TCA cream and applied to peri wound Laura Santiago, Laura Santiago. ([id number]) Peri-Wound Care: Triamcinolone Acetonide Cream, 0.1%, 15 (g) tube 3 x Per Week/30 Days Discharge Instructions: Apply as directed. mix 1:1 with nystatin cream for peri wound Primary Dressing: Cutimed Sorbact 1.5x 2.38 (in/in) 3 x Per Week/30 Days Discharge Instructions: A bacteria- and fungi binding wound dressing, suitable for cavities and fistulas. It is suitable as a wound filler and allows the passage of wound exudate into a secondary dressing. The dressing helps reducing odor and pain and can improve healing. Primary Dressing: keystone compound 3 x Per Week/30 Days Discharge Instructions: apply to wound bed as directed on medication label PLEASE follow direction on pill bottle for proper mixing Secondary Dressing: Zetuvit Absorbent Pad, 4x8 (in/in) 3 x Per Week/30 Days Compression Wrap: 3-LAYER WRAP - Profore Lite LF 3 Multilayer Compression Bandaging System 3 x Per Week/30 Days Discharge Instructions: Apply 3 multi-layer wrap as prescribed. Electronic Signature(s) Signed: 05/17/2022 4:42:45 PM By: Sharyn Creamer RN, BSN Signed: 05/18/2022 4:11:43 PM By: Worthy Keeler PA-C Entered By: Sharyn Creamer on 05/17/2022 10:34:05 Laura Santiago, Laura Santiago ([id number]) -------------------------------------------------------------------------------- Problem List Details Patient Name: Laura Santiago, Laura Santiago. Date of Service: 05/17/2022 9:45 AM Medical Record Number: [id number] Patient Account Number: [id number] Date of Birth/Sex: 28-Apr-1946 (76 y.o. F) Treating RN: Primary Care Provider: [facility], [place] Other Clinician: Referring Provider: [facility], [place] Treating Provider/Extender: Skipper Cliche in Treatment: 45 Active Problems ICD-10 Encounter Code Description Active Date  MDM Diagnosis L97.822 Non-pressure chronic ulcer of other part of left lower leg with fat layer 11/02/2021 No Yes exposed I87.312 Chronic venous hypertension (idiopathic) with ulcer of left lower 11/02/2021 No  Yes extremity I87.2 Venous insufficiency (chronic) (peripheral) 07/06/2021 No Yes Z79.01 Long term (current) use of anticoagulants 07/06/2021 No Yes I10 Essential (primary) hypertension 07/06/2021 No Yes 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 07/06/2021 No Yes Inactive Problems ICD-10 Code Description Active Date Inactive Date S81.802A Unspecified open wound, left lower leg, initial encounter 07/06/2021 07/06/2021 S91.101A Unspecified open wound of right great toe without damage to nail, initial 08/24/2021 08/24/2021 encounter S91.104A Unspecified open wound of right lesser toe(s) without damage to nail, initial 08/24/2021 08/24/2021 encounter Resolved Problems ICD-10 Code Description Active Date Resolved Date S91.104D Unspecified open wound of right lesser toe(s) without damage to nail, 08/31/2021 08/31/2021 subsequent encounter ADLER, ALTON ([id number]) S91.201D Unspecified open wound of right great toe with damage to nail, subsequent 08/31/2021 08/31/2021 encounter Electronic Signature(s) Signed: 05/17/2022 10:22:43 AM By: Worthy Keeler PA-C Entered By: Worthy Keeler on 05/17/2022 10:22:42 Laura Santiago, Laura Santiago ([id number]) -------------------------------------------------------------------------------- Progress Note Details Patient Name: Laura Santiago. Date of Service: 05/17/2022 9:45 AM Medical Record Number: [id number] Patient Account Number: [id number] Date of Birth/Sex: 1946-04-01 (76 y.o. F) Treating RN: Primary Care Provider: [facility] Other Clinician: Referring Provider: [facility], [place] Treating Provider/Extender: Skipper Cliche in Treatment: 66 Subjective Chief Complaint Information obtained from Patient Left lower extremity wound Right toe  wounds Left upper lateral thigh wounds History of Present Illness (HPI) Admission 7/27 Ms. Riham Polyakov is a 76 year old female with a past medical history of ADHD, metastatic breast cancer, stage IV chronic kidney disease, history of DVT on Xarelto and chronic venous insufficiency that presents to the clinic for a chronic left lower extremity wound. She recently moved to [facility] 4 days ago. She was being followed by wound care center in [name]. She reports a 10-year history of wounds to her left lower extremity that eventually do heal with debridement and compression therapy. She states that the current wound reopened 4 months ago and she is using Vaseline and Coban. She denies signs of infection. 8/3; patient presents for 1 week follow-up. She reports no issues or complaints today. She states she had vascular studies done in the last week. She denies signs of infection. She brought her little service dog with her today. 8/17; patient presents for follow-up. She has missed her last clinic appointment. She states she took the wrap off and attempted to rewrap her leg. She is having difficulty with transportation. She has her service dog with her today. Overall she feels well and reports improvement in wound healing. She denies signs of infection. She reports owning an old Velcro wrap compression and has this at her living facility 9/14; patient presents for follow-up. Patient states that over the past 2 to 3 weeks she developed toe wounds to her right foot. She attributes this to tight fitting shoes. She subsequently developed cellulitis in the right leg and has been treated by doxycycline by her oncologist. She reports improvement in symptoms however continues to have some redness and swelling to this leg. To the left lower extremity patient has been having her wraps changed with home health twice weekly. She states that the [facility] is not helping control the drainage. Other  than that she has no issues or complaints today. She denies signs of infection to the left lower extremity. 9/21; patient presents for follow-up. She reports seeing infectious disease for her cellulitis. She reports no further management. She has home health that changes the wraps twice weekly. She has no issues or complaints today. She denies  signs of infection. 10/5; patient presents for follow-up. She has no issues or complaints today. She denies signs of infection. She states that the right great toe has not been dressed by home health. 10/12; patient presents for follow-up. She has no issues or complaints today. She reports improvement in her wound healing. She has been using silver alginate to the right great toe wound. She denies signs of infection. 10/26; patient presents for follow-up. Home health did not have sorbact so they continued to use Hydrofera Blue under the wrap. She has been using silver alginate to the great toe wound however she did not have a dressing in place today. She currently denies signs of infection. 11/2; patient presents for follow-up. She has been using sorb act under the compression wrap. She reports using silver alginate to the toe wound again she does not have a dressing in place. She currently denies signs of infection. 11/23; patient presents for follow-up. Unfortunately she has missed her last 2 clinic appointments. She was last seen 3 weeks ago. She did her own compression wrap with Kerlix and Coban yesterday after seeing vein and vascular. She has not been dressing her right great toe wound. She currently denies signs of infection. 11/30; patient presents for 1 week follow-up. She states she changed her dressing last week prior to home health and use sorb act with Dakin's and Hydrofera Blue. Home health has changed the dressing as well and they have been using sorbact. Today she reports increased redness to her right lower extremity. She has a history of  cellulitis to this leg. She has been using silver alginate to the right great toe. Unfortunately she had an episode of diarrhea prior to coming in and had feces all over the right leg and to the wrap of her left leg. 12/7; patient presents for 1 week follow-up. She states that home health did not come out to change the dressing and she took it off yesterday. It is unclear if she is dressing the right toe wound. She denies signs of infection. 12/14; patient presents for 1 week follow-up. She has no issues or complaints today. 12/21; patient presents for follow-up. She has no issues or complaints today. She denies signs of infection. 12/28/2021; patient presents for follow-up. She was hospitalized for sepsis secondary to right lower extremity cellulitis On 12/23. She states she is currently at a SNF. She states that she was started on doxycycline this morning for her right great toe swelling and redness. She is not sure what dressings have been done to her left lower extremity for the past 3 weeks. She says its been mainly gauze with an Ace wrap. 1/25; patient presents for follow-up. She is still residing in a skilled nursing facility. She reports mild pain to the left lower extremity wound bed. She states she is going to see a podiatrist soon. 2/8; patient presents for follow-up. She has moved back to her residential community from her skilled nursing facility. She has no issues or complaints today. She denies signs of systemic infections. Laura Santiago, Laura Santiago ([id number]) 2/15; patient presents for follow-up. He has no issues or complaints today. She denies systemic signs of infection. 2/22; patient presents for follow-up. She has no issues or complaints today. She denies signs of infection. 3/1; patient presents for follow-up. She states that home health came out the day after she was seen in our clinic and yesterday to do the wrap change. She denies signs of infection. She reports excoriated skin on  the ankle. 3/8; patient presents for follow-up. She has no issues or complaints today. She denies signs of infection. 3/15; patient presents for follow-up. Home health has been coming out to change the dressings. She reports more tenderness to the wound site. She denies purulent drainage, increased warmth or erythema to the area. 4/5; patient presents for follow-up. She has missed her last 2 clinic appointments. I have not seen her in 3 weeks. She was recently hospitalized for altered mental status. She was involuntarily committed. She was evaluated by psychiatry and deemed to have competency. There was no specific cause of her altered mental status. It was concluded that her physical and mental health were declining due to her chronic medical conditions. Currently home health has been coming out for dressing changes. Patient has also been doing her own dressing changes. She reports more skin breakdown to the periwound and now has a new wound. She denies fever/chills. She reports continued tenderness to the wound site. 4/12; patient with significant venous insufficiency and a large wound on her left lower leg taking up about 80% of the circumference of her lower leg. Cultures of this grew MRSA and Pseudomonas. She had completed a course of ciprofloxacin now is starting doxycycline. She has been using Dakin's wet-to-dry and a Tubigrip. She has home health twice a week and we change it once. 4/19; patient presents for follow-up. She completed her course of doxycycline. She has been using Dakin's wet-to-dry dressing and Tubigrip. Home health changes the dressing twice weekly. Currently she has no issues or complaints. 4/26; patient presents for follow-up. At last clinic visit orders for home health were Iodosorb under compression therapy. Unfortunately they did not have the dressing and have been using Dakin's and gentamicin under the wrap. Patient currently denies signs of infection. She has no  issues or complaints today. 5/3; patient presents for follow-up. Again Iodosorb has not been used under the compression therapy when home health comes out to change the wrap and dressing. They have been using Sorbact. It is unclear why this is happening since we send orders weekly to the agency. She denies signs of infection. Patient has not purchased the [place] antibiotics. We reached out to the company and they said they have been trying to contact her on a regular basis. We gave the patient the number to call to order the medication. 5/10; patient presents for follow-up. She has no issues or complaints today. Again home health has not been using Iodosorb. Mepilex was on the wound bed. No other dressings noted. She brought in her Keystone antibiotics. She denies signs of infection. 5/17; patient presents for follow-up. Home health has come out twice since she was last seen. Joint well she has been using Keystone antibiotic with Sorbact under the compression wrap. She has no issues or complaints today. She denies signs of infection. 5/24; patient presents for follow-up. We have been using Keystone antibiotics with Sorbact under compression therapy. She is tolerating the treatment well. She is reporting improvement in wound healing. She denies signs of infection. 5/31; patient presents for follow-up. We continue to do [facility] antibiotics with Sorbact under compression therapy. She continues to report improvement in wound healing. Home health comes out and changes the dressing once weekly. 05-17-2022 upon evaluation today patient appears to be doing better in regard to her wound especially compared to the last time I saw her. Fortunately I do think that she is seeing improvements. With that being said I do believe that she may be  benefit from sharp debridement today to clear away some of the necrotic debris I discussed that with her as well. She is an amendable to that plan. Otherwise she is very  pleased with how the [employer] is doing for her. Objective Constitutional Well-nourished and well-hydrated in no acute distress. Vitals Time Taken: 10:20 AM, Height: 66 in, Weight: 153 lbs, BMI: 24.7, Temperature: 97.7 F, Pulse: 69 bpm, Respiratory Rate: 18 breaths/min, Blood Pressure: 131/69 mmHg. Respiratory normal breathing without difficulty. Psychiatric this patient is able to make decisions and demonstrates good insight into disease process. Alert and Oriented x 3. pleasant and cooperative. General Notes: Upon inspection patient's wound bed actually showed signs of good granulation and epithelization at this point. Fortunately I do not see any evidence of active infection locally or systemically which is great news I did perform debridement and postdebridement the wound bed Laura Santiago, MILROY. ([id number]) appears to be doing much better which is awesome news. Integumentary (Hair, Skin) Wound #1 status is Open. Original cause of wound was Gradually Appeared. The date acquired was: 04/06/2021. The wound has been in treatment 45 weeks. The wound is located on the Left,Medial Lower Leg. The wound measures 7cm length x 11cm width x 0.2cm depth; 60.476cm^2 area and 12.095cm^3 volume. There is Fat Layer (Subcutaneous Tissue) exposed. There is no tunneling or undermining noted. There is a medium amount of serosanguineous drainage noted. The wound margin is flat and intact. There is medium (34-66%) red, pink granulation within the wound bed. There is a medium (34-66%) amount of necrotic tissue within the wound bed including Adherent Slough. 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 There was a Excisional Skin/Subcutaneous Tissue Debridement with a total area of 63 sq cm performed by Tommie Sams., PA-C. With the following instrument(s): Curette Material removed includes Subcutaneous Tissue and Slough and after achieving pain control using Lidocaine 4% Topical Solution. No specimens were taken. A time out was conducted at 10:30, prior to the start of the procedure. A Minimum amount of bleeding was controlled with Pressure. The procedure was tolerated well with a pain level of 0 throughout and a pain level of 0 following the procedure. Post Debridement Measurements: 7cm length x 11cm width x 0.2cm depth; 12.095cm^3 volume. Character of Wound/Ulcer Post Debridement is improved. Severity of Tissue Post Debridement is: Fat layer exposed. 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 There was a Three Layer Compression Therapy Procedure by Sharyn Creamer, RN. Post procedure Diagnosis Wound #1: Same as Pre-Procedure Plan Follow-up Appointments: Return Appointment in 1 week. Nurse Visit as needed Home Health: [place]: - [place] for wound care. May utilize formulary equivalent dressing for wound treatment orders unless otherwise specified. Home Health Nurse may visit PRN to address patient s wound care needs. **Please direct any NON-WOUND related issues/requests for orders to patient's Primary Care Physician. **If current dressing causes regression in wound condition, may D/C ordered dressing product/s and apply Normal Saline Moist Dressing daily until next Wound Healing  Center or Other MD appointment. **Notify Wound Healing Center of regression in wound  condition at [phone]. Other Home Health Orders/Instructions: - Dressing change 3 x weekly, once by wound care and once at wound clinic weekly. PLEASE make sure frequency between dressing changes is appropriate. Bathing/ Shower/ Hygiene: May shower with wound dressing protected with water repellent cover or cast protector. No tub bath. Anesthetic (Use 'Patient Medications' Section for Anesthetic Order Entry): Lidocaine applied to wound bed Edema Control - Lymphedema / Segmental Compressive Device / Other: Optional: One layer of unna paste to top of compression wrap (to act as an anchor). - PLEASE when applying wrap start from toes and go up to just below knee Elevate, Exercise Daily and Avoid Standing for Long Periods of Time. Elevate legs to the level of the heart and pump ankles as often as possible Mack, Petrice M. ([id number]) Elevate leg(s) parallel to the floor when sitting. DO YOUR BEST to sleep in the bed at night. DO NOT sleep in your recliner. Long hours of sitting in a recliner leads to swelling of the legs and/or potential wounds on your backside. Additional Orders / Instructions: Follow Nutritious Diet and Increase Protein Intake Medications-Please add to medication list.: P.O. Antibiotics - continue as prescribed WOUND #1: - Lower Leg Wound Laterality: Left, Medial Cleanser: Wound Cleanser (Home Health) 3 x Per Week/30 Days Discharge Instructions: Wash your hands with soap and water. Remove old dressing, discard into plastic bag and place into trash. Cleanse the wound with Wound Cleanser prior to applying a clean dressing using gauze sponges, not tissues or cotton balls. Do not scrub or use excessive force. Pat dry using gauze sponges, not tissue or cotton balls. Peri-Wound Care: Nystatin Cream USP 30 (g) 3 x Per Week/30 Days Discharge Instructions: Use Nystatin Cream as directed. MIx 1:1 with TCA cream and applied to peri wound Peri-Wound Care: Triamcinolone Acetonide  Cream, 0.1%, 15 (g) tube 3 x Per Week/30 Days Discharge Instructions: Apply as directed. mix 1:1 with nystatin cream for peri wound Primary Dressing: Cutimed Sorbact 1.5x 2.38 (in/in) 3 x Per Week/30 Days Discharge Instructions: A bacteria- and fungi binding wound dressing, suitable for cavities and fistulas. It is suitable as a wound filler and allows the passage of wound exudate into a secondary dressing. The dressing helps reducing odor and pain and can improve healing. Primary Dressing: keystone compound 3 x Per Week/30 Days Discharge Instructions: apply to wound bed as directed on medication label PLEASE follow direction on pill bottle for proper mixing Secondary Dressing: Zetuvit Absorbent Pad, 4x8 (in/in) 3 x Per Week/30 Days Compression Wrap: 3-LAYER WRAP - Profore Lite LF 3 Multilayer Compression Bandaging System 3 x Per Week/30 Days Discharge Instructions: Apply 3 multi-layer wrap as prescribed. 1. I am going to suggest that we go ahead and continue with the wound care measures as before. This includes the Keystone topically to the wound bed. 2. I am also can recommend that we have the patient continue with the Sorbact to cover which does seem to be doing well followed by the Zetuvit and a 3 layer compression wrap. 3. I would also recommend the patient continue to elevate her leg is much as possible to help with edema control she should try to keep the wrap in place the issue is she is having some trouble with home health coming out to see her regularly and appropriately I am not sure what to do about that to be honest but as best she can she should  try to keep the dressing in place until she comes back in to be seen. We will see patient back for reevaluation in 1 week here in the clinic. If anything worsens or changes patient will contact our office for additional recommendations. Electronic Signature(s) Signed: 05/17/2022 12:58:25 PM By: Worthy Keeler PA-C Entered By: Worthy Keeler  on 05/17/2022 12:58:25 LIDA, BERKERY ([id number]) -------------------------------------------------------------------------------- SuperBill Details Patient Name: Laura Santiago. Date of Service: 05/17/2022 Medical Record Number: [id number] Patient Account Number: [id number] Date of Birth/Sex: 09/07/1946 (76 y.o. F) Treating RN: Primary Care Provider: [facility] Other Clinician: Referring Provider: [facility] Treating Provider/Extender: Skipper Cliche in Treatment: 45 Diagnosis Coding ICD-10 Codes Code Description [phone] 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 I87.2 Venous insufficiency (chronic) (peripheral) Z79.01 Long term (current) use of anticoagulants I10 Essential (primary) hypertension 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 Facility Procedures CPT4 Code: [id number] Description: 87867 - DEB SUBQ TISSUE 20 SQ CM/< Modifier: Quantity: 1 CPT4 Code: Description: ICD-10 Diagnosis Description L97.822 Non-pressure chronic ulcer of other part of left lower leg with fat layer Modifier: exposed Quantity: CPT4 Code: [id number] Description: 96283 - DEB SUBQ TISS EA ADDL 20CM Modifier: Quantity: 3 CPT4 Code: Description: ICD-10 Diagnosis Description L97.822 Non-pressure chronic ulcer of other part of left lower leg with fat layer Modifier: exposed Quantity: Physician Procedures CPT4 Code: 6629476 Description: 11042 - WC PHYS SUBQ TISS 20 SQ CM Modifier: Quantity: 1 CPT4 Code: Description: ICD-10 Diagnosis Description L97.822 Non-pressure chronic ulcer of other part of left lower leg with fat layer Modifier: exposed Quantity: CPT4 Code: 5465035 Description: 46568 - WC PHYS SUBQ TISS EA ADDL 20 CM Modifier: Quantity: 3 CPT4 Code: Description: ICD-10 Diagnosis Description L97.822 Non-pressure chronic ulcer of other part  of left lower leg with fat layer Modifier: exposed Quantity: Electronic Signature(s) Signed: 05/17/2022 12:58:35 PM By: Worthy Keeler PA-C Entered By: Worthy Keeler on 05/17/2022 12:58:35

## 2022-05-18 ENCOUNTER — Inpatient Hospital Stay: Payer: Medicare Other | Admitting: Medical Oncology

## 2022-05-22 ENCOUNTER — Other Ambulatory Visit: Payer: Self-pay | Admitting: Oncology

## 2022-05-24 ENCOUNTER — Encounter (HOSPITAL_BASED_OUTPATIENT_CLINIC_OR_DEPARTMENT_OTHER): Payer: Medicare Other | Admitting: Internal Medicine

## 2022-05-24 DIAGNOSIS — I87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: Secondary | ICD-10-CM

## 2022-05-24 DIAGNOSIS — L97822 Non-pressure chronic ulcer of other part of left lower leg with fat layer exposed: Secondary | ICD-10-CM | POA: Diagnosis not present

## 2022-05-24 NOTE — Progress Notes (Signed)
Laura Santiago, Laura Santiago ([id number]) Visit Report for 05/24/2022 Chief Complaint Document Details Patient Name: Laura Santiago, Laura Santiago. Date of Service: 05/24/2022 10:30 AM Medical Record Number: [id number] Patient Account Number: [id number] Date of Birth/Sex: Jun 05, 1946 (76 y.o. F) Treating RN: Levora Dredge Primary Care Provider: [facility], [place] Other Clinician: Referring Provider: [facility] Treating Provider/Extender: Yaakov Guthrie in Treatment: 34 Information Obtained from: Patient Chief Complaint Left lower extremity wound Right toe wounds Left upper lateral thigh wounds Electronic Signature(s) Signed: 05/24/2022 12:32:05 PM By: Kalman Shan DO Entered By: Kalman Shan on 05/24/2022 12:00:49 Laura Santiago ([id number]) -------------------------------------------------------------------------------- Debridement Details Patient Name: Laura Santiago. Date of Service: 05/24/2022 10:30 AM Medical Record Number: [id number] Patient Account Number: [id number] Date of Birth/Sex: 1946-12-11 (76 y.o. F) Treating RN: Levora Dredge Primary Care Provider: [facility], [place] Other Clinician: Referring Provider: [facility] Treating Provider/Extender: Yaakov Guthrie in Treatment: 46 Debridement Performed for Wound #1 Left,Medial Lower Leg Assessment: Performed By: Physician Kalman Shan, MD Debridement Type: Debridement Severity of Tissue Pre Debridement: Fat layer exposed Level of Consciousness (Pre- Awake and Alert procedure): Pre-procedure Verification/Time Out Yes - 11:21 Taken: Start Time: 11:21 Total Area Debrided (L x W): 9 (cm) x 12 (cm) = 108 (cm) Tissue and other material Viable, Non-Viable, Slough, Subcutaneous, Slough debrided: Level: Skin/Subcutaneous Tissue Debridement Description: Excisional Instrument: Curette Bleeding: Minimum Hemostasis Achieved: Pressure End Time: 11:30 Response to Treatment: Procedure was  tolerated well Level of Consciousness (Post- Awake and Alert procedure): Post Debridement Measurements of Total Wound Length: (cm) 8.4 Width: (cm) 11.4 Depth: (cm) 0.2 Volume: (cm) 15.042 Character of Wound/Ulcer Post Debridement: Stable Severity of Tissue Post Debridement: Fat layer exposed Post Procedure Diagnosis Same as Pre-procedure Electronic Signature(s) Signed: 05/24/2022 12:32:05 PM By: Kalman Shan DO Signed: 05/24/2022 1:49:35 PM By: [name] Signed: 05/24/2022 4:18:56 PM By: Levora Dredge Entered By: [name] on 05/24/2022 11:31:18 Laura Santiago ([id number]) -------------------------------------------------------------------------------- HPI Details Patient Name: Laura Santiago. Date of Service: 05/24/2022 10:30 AM Medical Record Number: [id number] Patient Account Number: [id number] Date of Birth/Sex: 05/01/1946 (76 y.o. F) Treating RN: Levora Dredge Primary Care Provider: [facility], [place] Other Clinician: Referring Provider: [facility], [place] Treating Provider/Extender: Yaakov Guthrie in Treatment: 46 History of Present Illness HPI Description: Admission 7/27 Ms. Laura Santiago is a 76 year old female with a past medical history of ADHD, metastatic breast cancer, stage IV chronic kidney disease, history of DVT on Xarelto and chronic venous insufficiency that presents to the clinic for a chronic left lower extremity wound. She recently moved to [facility] 4 days ago. She was being followed by wound care center in [name]. She reports a 10-year history of wounds to her left lower extremity that eventually do heal with debridement and compression therapy. She states that the current wound reopened 4 months ago and she is using Vaseline and Coban. She denies signs of infection. 8/3; patient presents for 1 week follow-up. She reports no issues or complaints today. She states she had vascular studies done in the last  week. She denies signs of infection. She brought her little service dog with her today. 8/17; patient presents for follow-up. She has missed her last clinic appointment. She states she took the wrap off and attempted to rewrap her leg. She is having difficulty with transportation. She has her service dog with her today. Overall she feels well and reports improvement in wound healing. She denies signs of infection. She reports owning an old Velcro wrap compression and  has this at her living facility 9/14; patient presents for follow-up. Patient states that over the past 2 to 3 weeks she developed toe wounds to her right foot. She attributes this to tight fitting shoes. She subsequently developed cellulitis in the right leg and has been treated by doxycycline by her oncologist. She reports improvement in symptoms however continues to have some redness and swelling to this leg. To the left lower extremity patient has been having her wraps changed with home health twice weekly. She states that the [facility] is not helping control the drainage. Other than that she has no issues or complaints today. She denies signs of infection to the left lower extremity. 9/21; patient presents for follow-up. She reports seeing infectious disease for her cellulitis. She reports no further management. She has home health that changes the wraps twice weekly. She has no issues or complaints today. She denies signs of infection. 10/5; patient presents for follow-up. She has no issues or complaints today. She denies signs of infection. She states that the right great toe has not been dressed by home health. 10/12; patient presents for follow-up. She has no issues or complaints today. She reports improvement in her wound healing. She has been using silver alginate to the right great toe wound. She denies signs of infection. 10/26; patient presents for follow-up. Home health did not have sorbact so they continued to use  Hydrofera Blue under the wrap. She has been using silver alginate to the great toe wound however she did not have a dressing in place today. She currently denies signs of infection. 11/2; patient presents for follow-up. She has been using sorb act under the compression wrap. She reports using silver alginate to the toe wound again she does not have a dressing in place. She currently denies signs of infection. 11/23; patient presents for follow-up. Unfortunately she has missed her last 2 clinic appointments. She was last seen 3 weeks ago. She did her own compression wrap with Kerlix and Coban yesterday after seeing vein and vascular. She has not been dressing her right great toe wound. She currently denies signs of infection. 11/30; patient presents for 1 week follow-up. She states she changed her dressing last week prior to home health and use sorb act with Dakin's and Hydrofera Blue. Home health has changed the dressing as well and they have been using sorbact. Today she reports increased redness to her right lower extremity. She has a history of cellulitis to this leg. She has been using silver alginate to the right great toe. Unfortunately she had an episode of diarrhea prior to coming in and had feces all over the right leg and to the wrap of her left leg. 12/7; patient presents for 1 week follow-up. She states that home health did not come out to change the dressing and she took it off yesterday. It is unclear if she is dressing the right toe wound. She denies signs of infection. 12/14; patient presents for 1 week follow-up. She has no issues or complaints today. 12/21; patient presents for follow-up. She has no issues or complaints today. She denies signs of infection. 12/28/2021; patient presents for follow-up. She was hospitalized for sepsis secondary to right lower extremity cellulitis On 12/23. She states she is currently at a SNF. She states that she was started on doxycycline this morning  for her right great toe swelling and redness. She is not sure what dressings have been done to her left lower extremity for the past  3 weeks. She says its been mainly gauze with an Ace wrap. 1/25; patient presents for follow-up. She is still residing in a skilled nursing facility. She reports mild pain to the left lower extremity wound bed. She states she is going to see a podiatrist soon. 2/8; patient presents for follow-up. She has moved back to her residential community from her skilled nursing facility. She has no issues or complaints today. She denies signs of systemic infections. 2/15; patient presents for follow-up. He has no issues or complaints today. She denies systemic signs of infection. 2/22; patient presents for follow-up. She has no issues or complaints today. She denies signs of infection. NYLANI, MICHETTI ([id number]) 3/1; patient presents for follow-up. She states that home health came out the day after she was seen in our clinic and yesterday to do the wrap change. She denies signs of infection. She reports excoriated skin on the ankle. 3/8; patient presents for follow-up. She has no issues or complaints today. She denies signs of infection. 3/15; patient presents for follow-up. Home health has been coming out to change the dressings. She reports more tenderness to the wound site. She denies purulent drainage, increased warmth or erythema to the area. 4/5; patient presents for follow-up. She has missed her last 2 clinic appointments. I have not seen her in 3 weeks. She was recently hospitalized for altered mental status. She was involuntarily committed. She was evaluated by psychiatry and deemed to have competency. There was no specific cause of her altered mental status. It was concluded that her physical and mental health were declining due to her chronic medical conditions. Currently home health has been coming out for dressing changes. Patient has also been doing her own  dressing changes. She reports more skin breakdown to the periwound and now has a new wound. She denies fever/chills. She reports continued tenderness to the wound site. 4/12; patient with significant venous insufficiency and a large wound on her left lower leg taking up about 80% of the circumference of her lower leg. Cultures of this grew MRSA and Pseudomonas. She had completed a course of ciprofloxacin now is starting doxycycline. She has been using Dakin's wet-to-dry and a Tubigrip. She has home health twice a week and we change it once. 4/19; patient presents for follow-up. She completed her course of doxycycline. She has been using Dakin's wet-to-dry dressing and Tubigrip. Home health changes the dressing twice weekly. Currently she has no issues or complaints. 4/26; patient presents for follow-up. At last clinic visit orders for home health were Iodosorb under compression therapy. Unfortunately they did not have the dressing and have been using Dakin's and gentamicin under the wrap. Patient currently denies signs of infection. She has no issues or complaints today. 5/3; patient presents for follow-up. Again Iodosorb has not been used under the compression therapy when home health comes out to change the wrap and dressing. They have been using Sorbact. It is unclear why this is happening since we send orders weekly to the agency. She denies signs of infection. Patient has not purchased the [place] antibiotics. We reached out to the company and they said they have been trying to contact her on a regular basis. We gave the patient the number to call to order the medication. 5/10; patient presents for follow-up. She has no issues or complaints today. Again home health has not been using Iodosorb. Mepilex was on the wound bed. No other dressings noted. She brought in her Keystone antibiotics. She denies signs of  infection. 5/17; patient presents for follow-up. Home health has come out twice since  she was last seen. Joint well she has been using Keystone antibiotic with Sorbact under the compression wrap. She has no issues or complaints today. She denies signs of infection. 5/24; patient presents for follow-up. We have been using Keystone antibiotics with Sorbact under compression therapy. She is tolerating the treatment well. She is reporting improvement in wound healing. She denies signs of infection. 5/31; patient presents for follow-up. We continue to do [facility] antibiotics with Sorbact under compression therapy. She continues to report improvement in wound healing. Home health comes out and changes the dressing once weekly. 05-17-2022 upon evaluation today patient appears to be doing better in regard to her wound especially compared to the last time I saw her. Fortunately I do think that she is seeing improvements. With that being said I do believe that she may be benefit from sharp debridement today to clear away some of the necrotic debris I discussed that with her as well. She is an amendable to that plan. Otherwise she is very pleased with how the [employer] is doing for her. 6/14; patient presents for follow-up. We have been using Keystone antibiotic with Sorbact and absorbent dressings under 3 layer compression. She has no issues or complaints today. She reports improvement in wound healing. She denies signs of infection. Electronic Signature(s) Signed: 05/24/2022 12:32:05 PM By: Kalman Shan DO Entered By: Kalman Shan on 05/24/2022 12:01:37 Laura Santiago ([id number]) -------------------------------------------------------------------------------- Physical Exam Details Patient Name: Laura Santiago, Laura Santiago. Date of Service: 05/24/2022 10:30 AM Medical Record Number: [id number] Patient Account Number: [id number] Date of Birth/Sex: 09-21-1946 (76 y.o. F) Treating RN: Levora Dredge Primary Care Provider: [facility], [place] Other Clinician: Referring Provider: [facility] Treating Provider/Extender: Yaakov Guthrie in Treatment: 35 Constitutional . Cardiovascular . Psychiatric . Notes Left lower extremity: Previous large wound has been divided into 2 smaller wounds by islands of epithelization. This has granulation tissue, slough and biofilm. No surrounding signs of infection. Electronic Signature(s) Signed: 05/24/2022 12:32:05 PM By: Kalman Shan DO Entered By: Kalman Shan on 05/24/2022 12:11:16 Laura Santiago ([id number]) -------------------------------------------------------------------------------- Physician Orders Details Patient Name: Laura Santiago. Date of Service: 05/24/2022 10:30 AM Medical Record Number: [id number] Patient Account Number: [id number] Date of Birth/Sex: 11-03-1946 (76 y.o. F) Treating RN: Levora Dredge Primary Care Provider: [facility], [place] Other Clinician: Referring Provider: [facility] Treating Provider/Extender: Yaakov Guthrie in Treatment: 56 Verbal / Phone Orders: No Diagnosis Coding Follow-up Appointments o Return Appointment in 1 week. o Nurse Visit as needed [place] for wound care. May utilize formulary equivalent dressing for wound treatment orders unless otherwise specified. Home Health Nurse may visit PRN to address patientos wound care needs. o **Please direct any NON-WOUND related issues/requests for orders to patient's Primary Care Physician. **If current dressing causes regression in wound condition, may D/C ordered dressing product/s and apply Normal Saline Moist Dressing daily until next [place] or Other MD appointment. **Notify Wound Healing Center of regression in wound condition at [phone]. o Other Home Health Orders/Instructions: - Dressing change 3 x weekly, once by wound care and once at wound clinic weekly. PLEASE make sure frequency between dressing  changes is appropriate. Bathing/ Shower/ Hygiene o May shower with wound dressing protected with water repellent cover or cast protector. o No tub bath. Anesthetic (Use 'Patient Medications' Section for Anesthetic Order Entry) o Lidocaine applied  to wound bed Edema Control - Lymphedema / Segmental Compressive Device / Other o Optional: One layer of unna paste to top of compression wrap (to act as an anchor). - PLEASE when applying wrap start from toes and go up to just below knee o Elevate, Exercise Daily and Avoid Standing for Long Periods of Time. o Elevate legs to the level of the heart and pump ankles as often as possible o Elevate leg(s) parallel to the floor when sitting. o DO YOUR BEST to sleep in the bed at night. DO NOT sleep in your recliner. Long hours of sitting in a recliner leads to swelling of the legs and/or potential wounds on your backside. Additional Orders / Instructions o Follow Nutritious Diet and Increase Protein Intake Wound Treatment Wound #1 - Lower Leg Wound Laterality: Left, Medial Cleanser: Wound Cleanser (Home Health) 3 x Per Week/30 Days Discharge Instructions: Wash your hands with soap and water. Remove old dressing, discard into plastic bag and place into trash. Cleanse the wound with Wound Cleanser prior to applying a clean dressing using gauze sponges, not tissues or cotton balls. Do not scrub or use excessive force. Pat dry using gauze sponges, not tissue or cotton balls. Peri-Wound Care: Nystatin Cream USP 30 (g) 3 x Per Week/30 Days Discharge Instructions: Use Nystatin Cream as directed. MIx 1:1 with TCA cream and applied to peri wound Peri-Wound Care: Triamcinolone Acetonide Cream, 0.1%, 15 (g) tube 3 x Per Week/30 Days Discharge Instructions: Apply as directed. mix 1:1 with nystatin cream for peri wound Primary Dressing: Cutimed Sorbact 1.5x 2.38 (in/in) 3 x Per Week/30 Days Discharge Instructions: A bacteria- and fungi binding  wound dressing, suitable for cavities and fistulas. It is suitable as a wound filler and allows the passage of wound exudate into a secondary dressing. The dressing helps reducing odor and pain and can improve healing. Primary Dressing: keystone compound 3 x Per Week/30 Days Discharge Instructions: apply to wound bed as directed on medication label PLEASE follow direction on pill bottle for proper mixing Secondary Dressing: Zetuvit Absorbent Pad, 4x8 (in/in) 3 x Per Week/30 Days Laura Santiago, Laura Santiago. ([id number]) Compression Wrap: 3-LAYER WRAP - Profore Lite LF 3 Multilayer Compression Bandaging System 3 x Per Week/30 Days Discharge Instructions: Apply 3 multi-layer wrap as prescribed. Electronic Signature(s) Signed: 05/24/2022 12:32:05 PM By: Kalman Shan DO Entered By: Kalman Shan on 05/24/2022 12:09:07 Laura Santiago ([id number]) -------------------------------------------------------------------------------- Problem List Details Patient Name: Laura Santiago, Laura Santiago. Date of Service: 05/24/2022 10:30 AM Medical Record Number: [id number] Patient Account Number: [id number] Date of Birth/Sex: [birth year] (76 y.o. F) Treating RN: Levora Dredge Primary Care Provider: [facility], [place] Other Clinician: Referring Provider: [facility], [place] Treating Provider/Extender: Yaakov Guthrie in Treatment: 5 Active Problems ICD-10 Encounter Code Description Active Date MDM Diagnosis L97.822 Non-pressure chronic ulcer of other part of left lower leg with fat layer 11/02/2021 No Yes exposed I87.312 Chronic venous hypertension (idiopathic) with ulcer of left lower 11/02/2021 No Yes extremity I87.2 Venous insufficiency (chronic) (peripheral) 07/06/2021 No Yes Z79.01 Long term (current) use of anticoagulants 07/06/2021 No Yes I10 Essential (primary) hypertension 07/06/2021 No Yes 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 07/06/2021 No Yes Inactive Problems ICD-10 Code Description  Active Date Inactive Date S81.802A Unspecified open wound, left lower leg, initial encounter 07/06/2021 07/06/2021 S91.101A Unspecified open wound of right great toe without damage to nail, initial 08/24/2021 08/24/2021 encounter S91.104A Unspecified open wound of right lesser toe(s) without damage to nail, initial 08/24/2021 08/24/2021 encounter Resolved Problems ICD-10 Code  Description Active Date Resolved Date S91.104D Unspecified open wound of right lesser toe(s) without damage to nail, 08/31/2021 08/31/2021 subsequent encounter SHANDI, GODFREY ([id number]) S91.201D Unspecified open wound of right great toe with damage to nail, subsequent 08/31/2021 08/31/2021 encounter Electronic Signature(s) Signed: 05/24/2022 12:32:05 PM By: Kalman Shan DO Entered By: Kalman Shan on 05/24/2022 12:00:46 Laura Santiago ([id number]) -------------------------------------------------------------------------------- Progress Note Details Patient Name: Laura Santiago. Date of Service: 05/24/2022 10:30 AM Medical Record Number: [id number] Patient Account Number: [id number] Date of Birth/Sex: 12-30-1945 (76 y.o. F) Treating RN: Levora Dredge Primary Care Provider: [facility], [place] Other Clinician: Referring Provider: [facility], [place] Treating Provider/Extender: Yaakov Guthrie in Treatment: 46 Subjective Chief Complaint Information obtained from Patient Left lower extremity wound Right toe wounds Left upper lateral thigh wounds History of Present Illness (HPI) Admission 7/27 Ms. Jennavie Martinek is a 76 year old female with a past medical history of ADHD, metastatic breast cancer, stage IV chronic kidney disease, history of DVT on Xarelto and chronic venous insufficiency that presents to the clinic for a chronic left lower extremity wound. She recently moved to [facility] 4 days ago. She was being followed by wound care center in [name]. She reports a 10-year history  of wounds to her left lower extremity that eventually do heal with debridement and compression therapy. She states that the current wound reopened 4 months ago and she is using Vaseline and Coban. She denies signs of infection. 8/3; patient presents for 1 week follow-up. She reports no issues or complaints today. She states she had vascular studies done in the last week. She denies signs of infection. She brought her little service dog with her today. 8/17; patient presents for follow-up. She has missed her last clinic appointment. She states she took the wrap off and attempted to rewrap her leg. She is having difficulty with transportation. She has her service dog with her today. Overall she feels well and reports improvement in wound healing. She denies signs of infection. She reports owning an old Velcro wrap compression and has this at her living facility 9/14; patient presents for follow-up. Patient states that over the past 2 to 3 weeks she developed toe wounds to her right foot. She attributes this to tight fitting shoes. She subsequently developed cellulitis in the right leg and has been treated by doxycycline by her oncologist. She reports improvement in symptoms however continues to have some redness and swelling to this leg. To the left lower extremity patient has been having her wraps changed with home health twice weekly. She states that the [facility] is not helping control the drainage. Other than that she has no issues or complaints today. She denies signs of infection to the left lower extremity. 9/21; patient presents for follow-up. She reports seeing infectious disease for her cellulitis. She reports no further management. She has home health that changes the wraps twice weekly. She has no issues or complaints today. She denies signs of infection. 10/5; patient presents for follow-up. She has no issues or complaints today. She denies signs of infection. She states that the right  great toe has not been dressed by home health. 10/12; patient presents for follow-up. She has no issues or complaints today. She reports improvement in her wound healing. She has been using silver alginate to the right great toe wound. She denies signs of infection. 10/26; patient presents for follow-up. Home health did not have sorbact so they continued to use Hydrofera Blue under the wrap. She  has been using silver alginate to the great toe wound however she did not have a dressing in place today. She currently denies signs of infection. 11/2; patient presents for follow-up. She has been using sorb act under the compression wrap. She reports using silver alginate to the toe wound again she does not have a dressing in place. She currently denies signs of infection. 11/23; patient presents for follow-up. Unfortunately she has missed her last 2 clinic appointments. She was last seen 3 weeks ago. She did her own compression wrap with Kerlix and Coban yesterday after seeing vein and vascular. She has not been dressing her right great toe wound. She currently denies signs of infection. 11/30; patient presents for 1 week follow-up. She states she changed her dressing last week prior to home health and use sorb act with Dakin's and Hydrofera Blue. Home health has changed the dressing as well and they have been using sorbact. Today she reports increased redness to her right lower extremity. She has a history of cellulitis to this leg. She has been using silver alginate to the right great toe. Unfortunately she had an episode of diarrhea prior to coming in and had feces all over the right leg and to the wrap of her left leg. 12/7; patient presents for 1 week follow-up. She states that home health did not come out to change the dressing and she took it off yesterday. It is unclear if she is dressing the right toe wound. She denies signs of infection. 12/14; patient presents for 1 week follow-up. She has no  issues or complaints today. 12/21; patient presents for follow-up. She has no issues or complaints today. She denies signs of infection. 12/28/2021; patient presents for follow-up. She was hospitalized for sepsis secondary to right lower extremity cellulitis On 12/23. She states she is currently at a SNF. She states that she was started on doxycycline this morning for her right great toe swelling and redness. She is not sure what dressings have been done to her left lower extremity for the past 3 weeks. She says its been mainly gauze with an Ace wrap. 1/25; patient presents for follow-up. She is still residing in a skilled nursing facility. She reports mild pain to the left lower extremity wound bed. She states she is going to see a podiatrist soon. 2/8; patient presents for follow-up. She has moved back to her residential community from her skilled nursing facility. She has no issues or complaints today. She denies signs of systemic infections. Laura Santiago, Laura Santiago ([id number]) 2/15; patient presents for follow-up. He has no issues or complaints today. She denies systemic signs of infection. 2/22; patient presents for follow-up. She has no issues or complaints today. She denies signs of infection. 3/1; patient presents for follow-up. She states that home health came out the day after she was seen in our clinic and yesterday to do the wrap change. She denies signs of infection. She reports excoriated skin on the ankle. 3/8; patient presents for follow-up. She has no issues or complaints today. She denies signs of infection. 3/15; patient presents for follow-up. Home health has been coming out to change the dressings. She reports more tenderness to the wound site. She denies purulent drainage, increased warmth or erythema to the area. 4/5; patient presents for follow-up. She has missed her last 2 clinic appointments. I have not seen her in 3 weeks. She was recently hospitalized for altered mental  status. She was involuntarily committed. She was evaluated by psychiatry and  deemed to have competency. There was no specific cause of her altered mental status. It was concluded that her physical and mental health were declining due to her chronic medical conditions. Currently home health has been coming out for dressing changes. Patient has also been doing her own dressing changes. She reports more skin breakdown to the periwound and now has a new wound. She denies fever/chills. She reports continued tenderness to the wound site. 4/12; patient with significant venous insufficiency and a large wound on her left lower leg taking up about 80% of the circumference of her lower leg. Cultures of this grew MRSA and Pseudomonas. She had completed a course of ciprofloxacin now is starting doxycycline. She has been using Dakin's wet-to-dry and a Tubigrip. She has home health twice a week and we change it once. 4/19; patient presents for follow-up. She completed her course of doxycycline. She has been using Dakin's wet-to-dry dressing and Tubigrip. Home health changes the dressing twice weekly. Currently she has no issues or complaints. 4/26; patient presents for follow-up. At last clinic visit orders for home health were Iodosorb under compression therapy. Unfortunately they did not have the dressing and have been using Dakin's and gentamicin under the wrap. Patient currently denies signs of infection. She has no issues or complaints today. 5/3; patient presents for follow-up. Again Iodosorb has not been used under the compression therapy when home health comes out to change the wrap and dressing. They have been using Sorbact. It is unclear why this is happening since we send orders weekly to the agency. She denies signs of infection. Patient has not purchased the [place] antibiotics. We reached out to the company and they said they have been trying to contact her on a regular basis. We gave the patient the  number to call to order the medication. 5/10; patient presents for follow-up. She has no issues or complaints today. Again home health has not been using Iodosorb. Mepilex was on the wound bed. No other dressings noted. She brought in her Keystone antibiotics. She denies signs of infection. 5/17; patient presents for follow-up. Home health has come out twice since she was last seen. Joint well she has been using Keystone antibiotic with Sorbact under the compression wrap. She has no issues or complaints today. She denies signs of infection. 5/24; patient presents for follow-up. We have been using Keystone antibiotics with Sorbact under compression therapy. She is tolerating the treatment well. She is reporting improvement in wound healing. She denies signs of infection. 5/31; patient presents for follow-up. We continue to do [facility] antibiotics with Sorbact under compression therapy. She continues to report improvement in wound healing. Home health comes out and changes the dressing once weekly. 05-17-2022 upon evaluation today patient appears to be doing better in regard to her wound especially compared to the last time I saw her. Fortunately I do think that she is seeing improvements. With that being said I do believe that she may be benefit from sharp debridement today to clear away some of the necrotic debris I discussed that with her as well. She is an amendable to that plan. Otherwise she is very pleased with how the [employer] is doing for her. 6/14; patient presents for follow-up. We have been using Keystone antibiotic with Sorbact and absorbent dressings under 3 layer compression. She has no issues or complaints today. She reports improvement in wound healing. She denies signs of infection. Objective Constitutional Vitals Time Taken: 10:47 AM, Height: 66 in, Weight: 153 lbs, BMI:  24.7, Temperature: 98.2 F, Pulse: 87 bpm, Respiratory Rate: 16 breaths/min, Blood Pressure: 143/73  mmHg. General Notes: Left lower extremity: Previous large wound has been divided into 2 smaller wounds by islands of epithelization. This has granulation tissue, slough and biofilm. No surrounding signs of infection. Integumentary (Hair, Skin) Wound #1 status is Open. Original cause of wound was Gradually Appeared. The date acquired was: 04/06/2021. The wound has been in treatment 46 weeks. The wound is located on the Left,Medial Lower Leg. The wound measures 8.4cm length x 11.4cm width x 0.2cm depth; 75.21cm^2 area and 15.042cm^3 volume. There is Fat Layer (Subcutaneous Tissue) exposed. There is a medium amount of serosanguineous drainage noted. The wound margin is flat and intact. There is medium (34-66%) red, pink granulation within the wound bed. There is a medium (34-66%) amount Laura Santiago, Laura Santiago. ([id number]) of necrotic tissue within the wound bed including Adherent Slough. 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 She now has 2 smaller wounds present. I debrided nonviable tissue. No surrounding signs of infection. I recommended continuing Keystone with Sorbact under 3 layer compression. 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 There was a Excisional Skin/Subcutaneous Tissue Debridement with a total area of 108 sq cm performed by Kalman Shan, MD. With the following instrument(s): Curette to remove Viable and Non-Viable tissue/material. Material removed includes  Subcutaneous Tissue and Slough and. A time out was conducted at 11:21, prior to the start of the procedure. A Minimum amount of bleeding was controlled with Pressure. The procedure was tolerated well. Post Debridement Measurements: 8.4cm length x 11.4cm width x 0.2cm depth; 15.042cm^3 volume. Character of Wound/Ulcer Post Debridement is stable. Severity of Tissue Post Debridement is: Fat layer exposed. Post procedure Diagnosis Wound #1: Same as Pre-Procedure Plan Follow-up Appointments: Return Appointment in 1 week. Nurse Visit as needed Home Health: [facility]: - [place] for wound care. May utilize formulary equivalent dressing for wound treatment orders unless otherwise specified. Home Health Nurse may visit [facility] to address patient s wound care needs. **Please direct any NON-WOUND related issues/requests for orders to patient's Primary Care Physician. **If current dressing causes regression in wound condition, may D/C ordered dressing product/s and apply Normal Saline Moist Dressing daily until next [place] or Other MD appointment. **Notify [facility] of regression in wound condition at [phone]. Other Home Health Orders/Instructions: - Dressing change 3 x weekly, once by wound care and once at wound clinic weekly. PLEASE make sure frequency between dressing changes is appropriate. Bathing/ Shower/ Hygiene: May shower with wound dressing protected with water repellent cover or cast protector. No tub bath. Anesthetic (Use 'Patient Medications' Section for Anesthetic Order Entry): Lidocaine applied to wound bed Edema Control - Lymphedema / Segmental Compressive Device / Other: Optional: One layer of unna paste to top of compression wrap (to act as an anchor). - PLEASE when applying wrap start from toes and go up to just below knee Elevate, Exercise Daily and Avoid Standing for Long Periods of Time. Elevate legs to the level of the  heart and pump  ankles as often as possible Elevate leg(s) parallel to the floor when sitting. DO YOUR BEST to sleep in the bed at night. DO NOT sleep in your recliner. Long hours of sitting in a recliner leads to swelling of the legs and/or potential wounds on your backside. Additional Orders / Instructions: Follow Nutritious Diet and Increase Protein Intake WOUND #1: - Lower Leg Wound Laterality: Left, Medial Cleanser: Wound Cleanser (Home Health) 3 x Per Week/30 Days Discharge Instructions: Wash your hands with soap and water. Remove old dressing, discard into plastic bag and place into trash. Cleanse the wound with Wound Cleanser prior to applying a clean dressing using gauze sponges, not tissues or cotton balls. Do not scrub or use Laura Santiago, Laura Santiago. ([id number]) excessive force. Pat dry using gauze sponges, not tissue or cotton balls. Peri-Wound Care: Nystatin Cream USP 30 (g) 3 x Per Week/30 Days Discharge Instructions: Use Nystatin Cream as directed. MIx 1:1 with TCA cream and applied to peri wound Peri-Wound Care: Triamcinolone Acetonide Cream, 0.1%, 15 (g) tube 3 x Per Week/30 Days Discharge Instructions: Apply as directed. mix 1:1 with nystatin cream for peri wound Primary Dressing: Cutimed Sorbact 1.5x 2.38 (in/in) 3 x Per Week/30 Days Discharge Instructions: A bacteria- and fungi binding wound dressing, suitable for cavities and fistulas. It is suitable as a wound filler and allows the passage of wound exudate into a secondary dressing. The dressing helps reducing odor and pain and can improve healing. Primary Dressing: keystone compound 3 x Per Week/30 Days Discharge Instructions: apply to wound bed as directed on medication label PLEASE follow direction on pill bottle for proper mixing Secondary Dressing: Zetuvit Absorbent Pad, 4x8 (in/in) 3 x Per Week/30 Days Compression Wrap: 3-LAYER WRAP - Profore Lite LF 3 Multilayer Compression Bandaging System 3 x Per Week/30 Days Discharge  Instructions: Apply 3 multi-layer wrap as prescribed. 1. In office sharp debridement 2. Keystone antibiotic with Sorbact under 3 layer compression 3. Follow-up in 1 week Electronic Signature(s) Signed: 05/24/2022 12:32:05 PM By: Kalman Shan DO Entered By: Kalman Shan on 05/24/2022 12:11:49 Laura Santiago ([id number]) -------------------------------------------------------------------------------- SuperBill Details Patient Name: Laura Santiago. Date of Service: 05/24/2022 Medical Record Number: [id number] Patient Account Number: [id number] Date of Birth/Sex: March 14, 1946 (76 y.o. F) Treating RN: Levora Dredge Primary Care Provider: [facility], [place] Other Clinician: Referring Provider: [facility], [place] Treating Provider/Extender: Yaakov Guthrie in Treatment: 46 Diagnosis Coding ICD-10 Codes Code Description [phone] 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 I87.2 Venous insufficiency (chronic) (peripheral) Z79.01 Long term (current) use of anticoagulants I10 Essential (primary) hypertension 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 Facility Procedures CPT4 Code: [id number] Description: 58850 - DEB SUBQ TISSUE 20 SQ CM/< Modifier: Quantity: 1 CPT4 Code: Description: ICD-10 Diagnosis Description L97.822 Non-pressure chronic ulcer of other part of left lower leg with fat layer I87.312 Chronic venous hypertension (idiopathic) with ulcer of left lower extremi Modifier: exposed ty Quantity: CPT4 Code: [id number] Description: 11045 - DEB SUBQ TISS EA ADDL 20CM Modifier: Quantity: 5 CPT4 Code: Description: ICD-10 Diagnosis Description L97.822 Non-pressure chronic ulcer of other part of left lower leg with fat layer I87.312 Chronic venous hypertension (idiopathic) with ulcer of left lower extremi Modifier: exposed  ty Quantity: Physician Procedures CPT4 Code: [id number] Description: 11042 - WC PHYS SUBQ TISS 20 SQ CM Modifier: Quantity: 1 CPT4 Code: Description: ICD-10 Diagnosis Description L97.822 Non-pressure chronic ulcer of other part of left  lower leg with fat layer I87.312 Chronic venous hypertension (idiopathic) with ulcer of left lower extremit Modifier: exposed y Quantity: CPT4 Code: [id number] Description: 01040 - WC PHYS SUBQ TISS EA ADDL 20 CM Modifier: Quantity: 5 CPT4 Code: Description: ICD-10 Diagnosis Description L97.822 Non-pressure chronic ulcer of other part of left lower leg with fat layer I87.312 Chronic venous hypertension (idiopathic) with ulcer of left lower extremit Modifier: exposed y Quantity: Electronic Signature(s) Signed: 05/24/2022 12:32:05 PM By: Kalman Shan DO Entered By: Kalman Shan on 05/24/2022 12:08:58

## 2022-05-25 NOTE — Progress Notes (Signed)
KIRSTINA, LEINWEBER ([id number]) Visit Report for 05/24/2022 Arrival Information Details Patient Name: Laura Santiago, Laura Santiago. Date of Service: 05/24/2022 10:30 AM Medical Record Number: [id number] Patient Account Number: [id number] Date of Birth/Sex: [birth year] (76 y.o. F) Treating RN: Levora Dredge Primary Care Erynne Kealey: [facility], [place] Other Clinician: [name] Referring Greenlee Ancheta: [facility] Treating Madelyne Millikan/Extender: Yaakov Guthrie in Treatment: 20 Visit Information History Since Last Visit Added or deleted any medications: No Patient Arrived: [name] Any new allergies or adverse reactions: No Arrival Time: 10:40 Had a fall or experienced change in No Transfer Assistance: None activities of daily living that may affect Patient Requires Transmission-Based No risk of falls: Precautions: Hospitalized since last visit: No Patient Has Alerts: Yes Pain Present Now: Yes Patient Alerts: PT HAS SERVICE ANIMAL ABI 07/11/21 R) 1.16 L) 1.27 Electronic Signature(s) Signed: 05/25/2022 5:09:42 PM By: Gretta Cool, BSN, RN, CWS, [name] RN, BSN Previous Signature: 05/24/2022 1:49:35 PM Version By: [name] Entered By: Gretta Cool BSN, RN, CWS, [name] on 05/24/2022 14:52:24 Laura Santiago ([id number]) -------------------------------------------------------------------------------- Clinic Level of Care Assessment Details Patient Name: Laura Santiago. Date of Service: 05/24/2022 10:30 AM Medical Record Number: [id number] Patient Account Number: [id number] Date of Birth/Sex: June 22, 1946 (76 y.o. F) Treating RN: Levora Dredge Primary Care Keshawn Sundberg: [facility], [place] Other Clinician: Referring Aariel Ems: [facility] Treating Makailah Slavick/Extender: Yaakov Guthrie in Treatment: 23 Clinic Level of Care Assessment Items TOOL 1 Quantity Score []  - Use when EandM and Procedure is performed on INITIAL visit 0 ASSESSMENTS - Nursing Assessment / Reassessment []  - General  Physical Exam (combine w/ comprehensive assessment (listed just below) when performed on new 0 pt. evals) []  - 0 Comprehensive Assessment (HX, ROS, Risk Assessments, Wounds Hx, etc.) ASSESSMENTS - Wound and Skin Assessment / Reassessment []  - Dermatologic / Skin Assessment (not related to wound area) 0 ASSESSMENTS - Ostomy and/or Continence Assessment and Care []  - Incontinence Assessment and Management 0 []  - 0 Ostomy Care Assessment and Management (repouching, etc.) PROCESS - Coordination of Care []  - Simple Patient / Family Education for ongoing care 0 []  - 0 Complex (extensive) Patient / Family Education for ongoing care []  - 0 Staff obtains Programmer, systems, Records, Test Results / Process Orders []  - 0 Staff telephones HHA, Nursing Homes / Clarify orders / etc []  - 0 Routine Transfer to another Facility (non-emergent condition) []  - 0 Routine Hospital Admission (non-emergent condition) []  - 0 New Admissions / Biomedical engineer / Ordering NPWT, Apligraf, etc. []  - 0 Emergency Hospital Admission (emergent condition) PROCESS - Special Needs []  - Pediatric / Minor Patient Management 0 []  - 0 Isolation Patient Management []  - 0 Hearing / Language / Visual special needs []  - 0 Assessment of Community assistance (transportation, D/C planning, etc.) []  - 0 Additional assistance / Altered mentation []  - 0 Support Surface(s) Assessment (bed, cushion, seat, etc.) INTERVENTIONS - Miscellaneous []  - External ear exam 0 []  - 0 Patient Transfer (multiple staff / Civil Service fast streamer / Similar devices) []  - 0 Simple Staple / Suture removal (25 or less) []  - 0 Complex Staple / Suture removal (26 or more) []  - 0 Hypo/Hyperglycemic Management (do not check if billed separately) []  - 0 Ankle / Brachial Index (ABI) - do not check if billed separately Has the patient been seen at the hospital within the last three years: Yes Total Score: 0 Level Of Care: ____ Laura Santiago  ([id number]) Electronic Signature(s) Signed: 05/24/2022 1:49:35 PM By: [name] Entered By: [name] on  05/24/2022 11:51:21 SOFIE, SCHENDEL ([id number]) -------------------------------------------------------------------------------- Compression Therapy Details Patient Name: Laura Santiago. Date of Service: 05/24/2022 10:30 AM Medical Record Number: [id number] Patient Account Number: [id number] Date of Birth/Sex: 1946/03/01 (76 y.o. F) Treating RN: Cornell Barman Primary Care Demetrio Leighty: [facility], [place] Other Clinician: [name] Referring Rashmi Tallent: [facility] Treating Anjel Pardo/Extender: Yaakov Guthrie in Treatment: 46 Compression Therapy Performed for Wound Assessment: Wound #1 Left,Medial Lower Leg Performed By: [name], [name], Compression Type: Three Layer Pre Treatment ABI: 1.5 Post Procedure Diagnosis Same as Pre-procedure Electronic Signature(s) Signed: 05/25/2022 4:07:29 PM By: [name] Entered By: [name] on 05/24/2022 16:31:30 Laura Santiago ([id number]) -------------------------------------------------------------------------------- Encounter Discharge Information Details Patient Name: Laura Santiago. Date of Service: 05/24/2022 10:30 AM Medical Record Number: [id number] Patient Account Number: [id number] Date of Birth/Sex: 05-Mar-1946 (76 y.o. F) Treating RN: Levora Dredge Primary Care Norris Brumbach: [facility], [place] Other Clinician: Referring Jermall Isaacson: [facility] Treating Erbie Arment/Extender: Yaakov Guthrie in Treatment: 61 Encounter Discharge Information Items Post Procedure Vitals Discharge Condition: Stable Temperature (F): 98.2 Ambulatory Status: Walker Pulse (bpm): 87 Discharge Destination: Home Respiratory Rate (breaths/min): 16 Transportation: Private Auto Blood Pressure (mmHg): 143/73 Accompanied By: self Schedule Follow-up Appointment: No Clinical Summary of  Care: Electronic Signature(s) Signed: 05/24/2022 1:49:35 PM By: [name] Entered By: [name] on 05/24/2022 11:54:31 Laura Santiago ([id number]) -------------------------------------------------------------------------------- Lower Extremity Assessment Details Patient Name: Laura Santiago. Date of Service: 05/24/2022 10:30 AM Medical Record Number: [id number] Patient Account Number: [id number] Date of Birth/Sex: [birth year] (76 y.o. F) Treating RN: Levora Dredge Primary Care Loron Weimer: [facility], [place] Other Clinician: Referring Alcee Sipos: [facility] Treating Galit Urich/Extender: Yaakov Guthrie in Treatment: 46 Edema Assessment Assessed: [Left: Yes] [Right: No] Edema: [Left: N] [Right: o] Vascular Assessment Pulses: Dorsalis Pedis Palpable: [Left:Yes] Electronic Signature(s) Signed: 05/24/2022 1:49:35 PM By: [name] Signed: 05/24/2022 4:18:56 PM By: Levora Dredge Entered By: [name] on 05/24/2022 11:01:55 Laura Santiago ([id number]) -------------------------------------------------------------------------------- Multi Wound Chart Details Patient Name: Laura Santiago. Date of Service: 05/24/2022 10:30 AM Medical Record Number: [id number] Patient Account Number: [id number] Date of Birth/Sex: 11-28-1946 (76 y.o. F) Treating RN: Levora Dredge Primary Care Mutasim Tuckey: [facility], [place] Other Clinician: Referring Maisyn Nouri: [facility] Treating Zykira Matlack/Extender: Yaakov Guthrie in Treatment: 34 Vital Signs Height(in): 66 Pulse(bpm): 30 Weight(lbs): 153 Blood Pressure(mmHg): 143/73 Body Mass Index(BMI): 24.7 Temperature(F): 98.2 Respiratory Rate(breaths/min): 16 Photos: [N/A:N/A] Wound Location: Left, Medial Lower Leg N/A N/A Wounding Event: Gradually Appeared N/A N/A Primary Etiology: Venous Leg Ulcer N/A N/A Comorbid History: Hypertension, Osteoarthritis, N/A N/A Received Chemotherapy,  Received Radiation Date Acquired: 04/06/2021 N/A N/A Weeks of Treatment: 46 N/A N/A Wound Status: Open N/A N/A Wound Recurrence: No N/A N/A Clustered Wound: Yes N/A N/A Measurements L x W x D (cm) 8.4x11.4x0.2 N/A N/A Area (cm) : 75.21 N/A N/A Volume (cm) : 15.042 N/A N/A % Reduction in Area: 16.70% N/A N/A % Reduction in Volume: 16.70% N/A N/A Classification: Full Thickness Without Exposed N/A N/A Support Structures Exudate Amount: Medium N/A N/A Exudate Type: Serosanguineous N/A N/A Exudate Color: red, brown N/A N/A Wound Margin: Flat and Intact N/A N/A Granulation Amount: Medium (34-66%) N/A N/A Granulation Quality: Red, Pink N/A N/A Necrotic Amount: Medium (34-66%) N/A N/A Exposed Structures: Fat Layer (Subcutaneous Tissue): N/A N/A Yes Fascia: No Tendon: No Muscle: No Joint: No Bone: No Epithelialization: Small (1-33%) N/A N/A Treatment Notes Electronic Signature(s) Signed: 05/24/2022 1:49:35 PM By: [name] Entered By: [name] on 05/24/2022 11:02:09  KARNA, ABED ([id number]) LAVORA, BRISBON ([id number]) -------------------------------------------------------------------------------- [place] Details Patient Name: TIFFANIE, BLASSINGAME. Date of Service: 05/24/2022 10:30 AM Medical Record Number: [id number] Patient Account Number: [id number] Date of Birth/Sex: [birth year] (76 y.o. F) Treating RN: Levora Dredge Primary Care Adessa Primiano: [facility], [place] Other Clinician: Referring Frankye Schwegel: [facility] Treating Kaleeyah Cuffie/Extender: Yaakov Guthrie in Treatment: 20 Active Inactive Soft Tissue Infection Nursing Diagnoses: Impaired tissue integrity Potential for infection: soft tissue Goals: Patient's soft tissue infection will resolve Date Initiated: 03/15/2022 Target Resolution Date: 04/27/2022 Goal Status: Active Signs and symptoms of infection will be recognized early to allow for prompt treatment Date  Initiated: 03/15/2022 Date Inactivated: 04/26/2022 Target Resolution Date: 04/27/2022 Goal Status: Met Interventions: Assess signs and symptoms of infection every visit Treatment Activities: Culture and sensitivity : 03/15/2022 Notes: Wound/Skin Impairment Nursing Diagnoses: Impaired tissue integrity Goals: Patient/caregiver will verbalize understanding of skin care regimen Date Initiated: 07/06/2021 Date Inactivated: 07/27/2021 Target Resolution Date: 07/06/2021 Goal Status: Met Ulcer/skin breakdown will have a volume reduction of 30% by week 4 Date Initiated: 07/06/2021 Date Inactivated: 10/12/2021 Target Resolution Date: 08/06/2021 Goal Status: Unmet Unmet Reason: cont tx Ulcer/skin breakdown will have a volume reduction of 50% by week 8 Date Initiated: 07/06/2021 Target Resolution Date: 09/06/2021 Goal Status: Active Ulcer/skin breakdown will have a volume reduction of 80% by week 12 Date Initiated: 07/06/2021 Target Resolution Date: 10/06/2021 Goal Status: Active Ulcer/skin breakdown will heal within 14 weeks Date Initiated: 07/06/2021 Target Resolution Date: 11/06/2021 Goal Status: Active Interventions: Assess patient/caregiver ability to obtain necessary supplies Assess patient/caregiver ability to perform ulcer/skin care regimen upon admission and as needed Assess ulceration(s) every visit Treatment Activities: Referred to DME Diannie Willner for dressing supplies : 07/06/2021 Skin care regimen initiated : 07/06/2021 Notes: KIKUE, GERHART ([id number]) Electronic Signature(s) Signed: 05/24/2022 1:49:35 PM By: [name] Signed: 05/24/2022 4:18:56 PM By: Levora Dredge Entered By: [name] on 05/24/2022 11:02:01 Laura Santiago ([id number]) -------------------------------------------------------------------------------- Pain Assessment Details Patient Name: Laura Santiago. Date of Service: 05/24/2022 10:30 AM Medical Record Number: [id number] Patient Account Number:  [id number] Date of Birth/Sex: [birth year] (76 y.o. F) Treating RN: Levora Dredge Primary Care Kahleb Mcclane: [facility], [place] Other Clinician: Referring Dicky Boer: [facility] Treating Rianne Degraaf/Extender: Yaakov Guthrie in Treatment: 89 Active Problems Location of Pain Severity and Description of Pain Patient Has Paino Yes Site Locations Pain Location: Generalized Pain Duration of the Pain. Constant / Intermittento Constant Rate the pain. Current Pain Level: 2 Character of Pain Describe the Pain: Aching Pain Management and Medication Current Pain Management: Rest: Yes Electronic Signature(s) Signed: 05/24/2022 1:49:35 PM By: [name] Signed: 05/24/2022 4:18:56 PM By: Levora Dredge Entered By: [name] on 05/24/2022 10:49:27 Laura Santiago ([id number]) -------------------------------------------------------------------------------- Patient/Caregiver Education Details Patient Name: CHRISTALYNN, BOISE. Date of Service: 05/24/2022 10:30 AM Medical Record Number: [id number] Patient Account Number: [id number] Date of Birth/Gender: 1946/08/02 (76 y.o. F) Treating RN: Levora Dredge Primary Care Physician: [facility], [place] Other Clinician: Referring Physician: [facility] Treating Physician/Extender: Yaakov Guthrie in Treatment: 106 Education Assessment Education Provided To: Patient Education Topics Provided Wound/Skin Impairment: Handouts: Other: contunue wound care as directed Methods: Explain/Verbal Responses: State content correctly Electronic Signature(s) Signed: 05/24/2022 1:49:35 PM By: [name] Entered By: [name] on 05/24/2022 11:52:40 Laura Santiago ([id number]) -------------------------------------------------------------------------------- Wound Assessment Details Patient Name: Laura Santiago. Date of Service: 05/24/2022 10:30 AM Medical Record Number: [id number] Patient Account Number:  [id number] Date of Birth/Sex: 04/23/1946 (76 y.o. F) Treating RN:  Levora Dredge Primary Care James Senn: [facility], [place] Other Clinician: Referring Merril Isakson: [facility], [place] Treating Clancy Mullarkey/Extender: Yaakov Guthrie in Treatment: 46 Wound Status Wound Number: 1 Primary Venous Leg Ulcer Etiology: Wound Location: Left, Medial Lower Leg Wound Status: Open Wounding Event: Gradually Appeared Comorbid Hypertension, Osteoarthritis, Received Chemotherapy, Date Acquired: 04/06/2021 History: Received Radiation Weeks Of Treatment: 46 Clustered Wound: Yes Photos Wound Measurements Length: (cm) 8.4 Width: (cm) 11.4 Depth: (cm) 0.2 Area: (cm) 75.21 Volume: (cm) 15.042 % Reduction in Area: 16.7% % Reduction in Volume: 16.7% Epithelialization: Small (1-33%) Wound Description Classification: Full Thickness Without Exposed Support Structures Wound Margin: Flat and Intact Exudate Amount: Medium Exudate Type: Serosanguineous Exudate Color: red, brown Foul Odor After Cleansing: No Slough/Fibrino Yes Wound Bed Granulation Amount: Medium (34-66%) Exposed Structure Granulation Quality: Red, Pink Fascia Exposed: No Necrotic Amount: Medium (34-66%) Fat Layer (Subcutaneous Tissue) Exposed: Yes Necrotic Quality: Adherent Slough Tendon Exposed: No Muscle Exposed: No Joint Exposed: No Bone Exposed: No Treatment Notes Wound #1 (Lower Leg) Wound Laterality: Left, Medial Cleanser Wound Cleanser Discharge Instruction: Wash your hands with soap and water. Remove old dressing, discard into plastic bag and place into trash. Cleanse the wound with Wound Cleanser prior to applying a clean dressing using gauze sponges, not tissues or cotton balls. Do not scrub or use excessive force. Pat dry using gauze sponges, not tissue or cotton balls. DESTANI, WAMSER ([id number]) Peri-Wound Care Nystatin Cream USP 30 (g) Discharge Instruction: Use Nystatin Cream as directed. MIx 1:1 with TCA  cream and applied to peri wound Triamcinolone Acetonide Cream, 0.1%, 15 (g) tube Discharge Instruction: Apply as directed. mix 1:1 with nystatin cream for peri wound Topical Primary Dressing Cutimed Sorbact 1.5x 2.38 (in/in) Discharge Instruction: A bacteria- and fungi binding wound dressing, suitable for cavities and fistulas. It is suitable as a wound filler and allows the passage of wound exudate into a secondary dressing. The dressing helps reducing odor and pain and can improve healing. keystone compound Discharge Instruction: apply to wound bed as directed on medication label PLEASE follow direction on pill bottle for proper mixing Secondary Dressing Zetuvit Absorbent Pad, 4x8 (in/in) Secured With Compression Wrap 3-LAYER WRAP - Profore Lite LF 3 Multilayer Compression Bandaging System Discharge Instruction: Apply 3 multi-layer wrap as prescribed. Compression Stockings Add-Ons Electronic Signature(s) Signed: 05/24/2022 1:49:35 PM By: [name] Signed: 05/24/2022 4:18:56 PM By: Levora Dredge Entered By: [name] on 05/24/2022 11:01:27 SHABRIA, EGLEY ([id number]) -------------------------------------------------------------------------------- Vitals Details Patient Name: Laura Santiago. Date of Service: 05/24/2022 10:30 AM Medical Record Number: [id number] Patient Account Number: [id number] Date of Birth/Sex: 1946-12-08 (76 y.o. F) Treating RN: Levora Dredge Primary Care Porshea Janowski: [facility], [place] Other Clinician: Referring Amaria Mundorf: [facility] Treating Nabil Bubolz/Extender: Yaakov Guthrie in Treatment: 12 Vital Signs Time Taken: 10:47 Temperature (F): 98.2 Height (in): 66 Pulse (bpm): 87 Weight (lbs): 153 Respiratory Rate (breaths/min): 16 Body Mass Index (BMI): 24.7 Blood Pressure (mmHg): 143/73 Reference Range: 80 - 120 mg / dl Electronic Signature(s) Signed: 05/24/2022 1:49:35 PM By: [name] Entered By: [name]  on 05/24/2022 10:48:26

## 2022-05-30 ENCOUNTER — Ambulatory Visit (INDEPENDENT_AMBULATORY_CARE_PROVIDER_SITE_OTHER): Payer: Medicare Other | Admitting: Vascular Surgery

## 2022-05-31 ENCOUNTER — Encounter (HOSPITAL_BASED_OUTPATIENT_CLINIC_OR_DEPARTMENT_OTHER): Payer: Medicare Other | Admitting: Internal Medicine

## 2022-05-31 DIAGNOSIS — I87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: Secondary | ICD-10-CM

## 2022-05-31 DIAGNOSIS — L97822 Non-pressure chronic ulcer of other part of left lower leg with fat layer exposed: Secondary | ICD-10-CM | POA: Diagnosis not present

## 2022-05-31 NOTE — Progress Notes (Signed)
Laura Santiago, Laura Santiago ([id number]) Visit Report for 05/31/2022 Chief Complaint Document Details Patient Name: Laura Santiago, Laura Santiago. Date of Service: 05/31/2022 10:30 AM Medical Record Number: [id number] Patient Account Number: [id number] Date of Birth/Sex: 1946/08/25 (76 y.o. F) Treating RN: Alycia Rossetti Primary Care Provider: [facility] Other Clinician: Referring Provider: [facility] Treating Provider/Extender: Yaakov Guthrie in Treatment: 74 Information Obtained from: Patient Chief Complaint Left lower extremity wound Right toe wounds Left upper lateral thigh wounds Electronic Signature(s) Signed: 05/31/2022 2:16:41 PM By: Kalman Shan DO Previous Signature: 05/31/2022 11:58:52 AM Version By: Alycia Rossetti Entered By: Kalman Shan on 05/31/2022 13:08:41 Laura Santiago ([id number]) -------------------------------------------------------------------------------- Debridement Details Patient Name: Laura Santiago. Date of Service: 05/31/2022 10:30 AM Medical Record Number: [id number] Patient Account Number: [id number] Date of Birth/Sex: Feb 05, 1946 (76 y.o. F) Treating RN: Alycia Rossetti Primary Care Provider: [facility] Other Clinician: Referring Provider: [facility] Treating Provider/Extender: Yaakov Guthrie in Treatment: 47 Debridement Performed for Wound #1 Left,Medial Lower Leg Assessment: Performed By: Physician Kalman Shan, MD Debridement Type: Debridement Severity of Tissue Pre Debridement: Fat layer exposed Level of Consciousness (Pre- Awake and Alert procedure): Pre-procedure Verification/Time Out Yes - 11:06 Taken: Start Time: 11:06 Pain Control: Lidocaine 4% Topical Solution Total Area Debrided (L x W): 8.4 (cm) x 11 (cm) = 92.4 (cm) Tissue and other material Viable, Non-Viable, Subcutaneous, Biofilm debrided: Level: Skin/Subcutaneous Tissue Debridement Description: Excisional Instrument:  Curette Bleeding: Minimum Hemostasis Achieved: Pressure End Time: 11:09 Procedural Pain: 0 Post Procedural Pain: 0 Response to Treatment: Procedure was tolerated well Level of Consciousness (Post- Awake and Alert procedure): Post Debridement Measurements of Total Wound Length: (cm) 8.4 Width: (cm) 11 Depth: (cm) 0.2 Volume: (cm) 14.514 Character of Wound/Ulcer Post Debridement: Improved Severity of Tissue Post Debridement: Fat layer exposed Post Procedure Diagnosis Same as Pre-procedure Electronic Signature(s) Signed: 05/31/2022 11:56:07 AM By: Alycia Rossetti Signed: 05/31/2022 2:16:41 PM By: Kalman Shan DO Entered By: Alycia Rossetti on 05/31/2022 11:56:07 Laura Santiago ([id number]) -------------------------------------------------------------------------------- HPI Details Patient Name: Laura Santiago. Date of Service: 05/31/2022 10:30 AM Medical Record Number: [id number] Patient Account Number: [id number] Date of Birth/Sex: [birth year] (76 y.o. F) Treating RN: Alycia Rossetti Primary Care Provider: [facility], [place] Other Clinician: Referring Provider:  [facility] Treating Provider/Extender: Yaakov Guthrie in Treatment: 48 History of Present Illness HPI Description: Admission 7/27 Ms. Laura Santiago is a 76 year old female with a past medical history of ADHD, metastatic breast cancer, stage IV chronic kidney disease, history of DVT on Xarelto and chronic venous insufficiency that presents to the clinic for a chronic left lower extremity wound. She recently moved to [facility] 4 days ago. She was being followed by wound care center in [name]. She reports a 10-year history of wounds to her left lower extremity that eventually do heal with debridement and compression therapy. She states that the current wound reopened 4 months ago and she is using Vaseline and Coban. She denies signs of infection. 8/3; patient presents for 1 week follow-up.  She reports no issues or complaints today. She states she had vascular studies done in the last week. She denies signs of infection. She brought her little service dog with her today. 8/17; patient presents for follow-up. She has missed her last clinic appointment. She states she took the wrap off and attempted to rewrap her leg. She is having difficulty with transportation. She has her service dog with her today. Overall she feels well and reports improvement in wound healing.  She denies signs of infection. She reports owning an old Velcro wrap compression and has this at her living facility 9/14; patient presents for follow-up. Patient states that over the past 2 to 3 weeks she developed toe wounds to her right foot. She attributes this to tight fitting shoes. She subsequently developed cellulitis in the right leg and has been treated by doxycycline by her oncologist. She reports improvement in symptoms however continues to have some redness and swelling to this leg. To the left lower extremity patient has been having her wraps changed with home health twice weekly. She states that the [facility] is not helping control the drainage. Other than that she has no issues or complaints today. She denies signs of infection to the left lower extremity. 9/21; patient presents for follow-up. She reports seeing infectious disease for her cellulitis. She reports no further management. She has home health that changes the wraps twice weekly. She has no issues or complaints today. She denies signs of infection. 10/5; patient presents for follow-up. She has no issues or complaints today. She denies signs of infection. She states that the right great toe has not been dressed by home health. 10/12; patient presents for follow-up. She has no issues or complaints today. She reports improvement in her wound healing. She has been using silver alginate to the right great toe wound. She denies signs of  infection. 10/26; patient presents for follow-up. Home health did not have sorbact so they continued to use Hydrofera Blue under the wrap. She has been using silver alginate to the great toe wound however she did not have a dressing in place today. She currently denies signs of infection. 11/2; patient presents for follow-up. She has been using sorb act under the compression wrap. She reports using silver alginate to the toe wound again she does not have a dressing in place. She currently denies signs of infection. 11/23; patient presents for follow-up. Unfortunately she has missed her last 2 clinic appointments. She was last seen 3 weeks ago. She did her own compression wrap with Kerlix and Coban yesterday after seeing vein and vascular. She has not been dressing her right great toe wound. She currently denies signs of infection. 11/30; patient presents for 1 week follow-up. She states she changed her dressing last week prior to home health and use sorb act with Dakin's and Hydrofera Blue. Home health has changed the dressing as well and they have been using sorbact. Today she reports increased redness to her right lower extremity. She has a history of cellulitis to this leg. She has been using silver alginate to the right great toe. Unfortunately she had an episode of diarrhea prior to coming in and had feces all over the right leg and to the wrap of her left leg. 12/7; patient presents for 1 week follow-up. She states that home health did not come out to change the dressing and she took it off yesterday. It is unclear if she is dressing the right toe wound. She denies signs of infection. 12/14; patient presents for 1 week follow-up. She has no issues or complaints today. 12/21; patient presents for follow-up. She has no issues or complaints today. She denies signs of infection. 12/28/2021; patient presents for follow-up. She was hospitalized for sepsis secondary to right lower extremity cellulitis  On 12/23. She states she is currently at a SNF. She states that she was started on doxycycline this morning for her right great toe swelling and redness. She is not  sure what dressings have been done to her left lower extremity for the past 3 weeks. She says its been mainly gauze with an Ace wrap. 1/25; patient presents for follow-up. She is still residing in a skilled nursing facility. She reports mild pain to the left lower extremity wound bed. She states she is going to see a podiatrist soon. 2/8; patient presents for follow-up. She has moved back to her residential community from her skilled nursing facility. She has no issues or complaints today. She denies signs of systemic infections. 2/15; patient presents for follow-up. He has no issues or complaints today. She denies systemic signs of infection. 2/22; patient presents for follow-up. She has no issues or complaints today. She denies signs of infection. Laura Santiago, Laura Santiago ([id number]) 3/1; patient presents for follow-up. She states that home health came out the day after she was seen in our clinic and yesterday to do the wrap change. She denies signs of infection. She reports excoriated skin on the ankle. 3/8; patient presents for follow-up. She has no issues or complaints today. She denies signs of infection. 3/15; patient presents for follow-up. Home health has been coming out to change the dressings. She reports more tenderness to the wound site. She denies purulent drainage, increased warmth or erythema to the area. 4/5; patient presents for follow-up. She has missed her last 2 clinic appointments. I have not seen her in 3 weeks. She was recently hospitalized for altered mental status. She was involuntarily committed. She was evaluated by psychiatry and deemed to have competency. There was no specific cause of her altered mental status. It was concluded that her physical and mental health were declining due to her chronic  medical conditions. Currently home health has been coming out for dressing changes. Patient has also been doing her own dressing changes. She reports more skin breakdown to the periwound and now has a new wound. She denies fever/chills. She reports continued tenderness to the wound site. 4/12; patient with significant venous insufficiency and a large wound on her left lower leg taking up about 80% of the circumference of her lower leg. Cultures of this grew MRSA and Pseudomonas. She had completed a course of ciprofloxacin now is starting doxycycline. She has been using Dakin's wet-to-dry and a Tubigrip. She has home health twice a week and we change it once. 4/19; patient presents for follow-up. She completed her course of doxycycline. She has been using Dakin's wet-to-dry dressing and Tubigrip. Home health changes the dressing twice weekly. Currently she has no issues or complaints. 4/26; patient presents for follow-up. At last clinic visit orders for home health were Iodosorb under compression therapy. Unfortunately they did not have the dressing and have been using Dakin's and gentamicin under the wrap. Patient currently denies signs of infection. She has no issues or complaints today. 5/3; patient presents for follow-up. Again Iodosorb has not been used under the compression therapy when home health comes out to change the wrap and dressing. They have been using Sorbact. It is unclear why this is happening since we send orders weekly to the agency. She denies signs of infection. Patient has not purchased the [place] antibiotics. We reached out to the company and they said they have been trying to contact her on a regular basis. We gave the patient the number to call to order the medication. 5/10; patient presents for follow-up. She has no issues or complaints today. Again home health has not been using Iodosorb. Mepilex was on the wound bed.  No other dressings noted. She brought in her [facility]  antibiotics. She denies signs of infection. 5/17; patient presents for follow-up. Home health has come out twice since she was last seen. Joint well she has been using Keystone antibiotic with Sorbact under the compression wrap. She has no issues or complaints today. She denies signs of infection. 5/24; patient presents for follow-up. We have been using [facility] antibiotics with Sorbact under compression therapy. She is tolerating the treatment well. She is reporting improvement in wound healing. She denies signs of infection. 5/31; patient presents for follow-up. We continue to do [facility] antibiotics with Sorbact under compression therapy. She continues to report improvement in wound healing. Home health comes out and changes the dressing once weekly. 05-17-2022 upon evaluation today patient appears to be doing better in regard to her wound especially compared to the last time I saw her. Fortunately I do think that she is seeing improvements. With that being said I do believe that she may be benefit from sharp debridement today to clear away some of the necrotic debris I discussed that with her as well. She is an amendable to that plan. Otherwise she is very pleased with how the [employer] is doing for her. 6/14; patient presents for follow-up. We have been using Keystone antibiotic with Sorbact and absorbent dressings under 3 layer compression. She has no issues or complaints today. She reports improvement in wound healing. She denies signs of infection. 6/21; patient presents for follow-up. We are continuing with [facility] antibiotic and Sorbact under 3 layer compression. Patient has no complaints. Continued wound healing is happening. She denies signs of infection. Electronic Signature(s) Signed: 05/31/2022 2:16:41 PM By: Kalman Shan DO Entered By: Kalman Shan on 05/31/2022 13:09:07 Laura Santiago  ([id number]) -------------------------------------------------------------------------------- Physical Exam Details Patient Name: Laura Santiago, Laura Santiago. Date of Service: 05/31/2022 10:30 AM Medical Record Number: [id number] Patient Account Number: [id number] Date of Birth/Sex: Aug 06, 1946 (76 y.o. F) Treating RN: Alycia Rossetti Primary Care Provider: [facility] Other Clinician: Referring Provider: [facility] Treating Provider/Extender: Yaakov Guthrie in Treatment: 48 Constitutional . Cardiovascular . Psychiatric . Notes Left lower extremity: 2 wounds divided by islands of epithelization. The open areas have granulation tissue slough and biofilm. No surrounding signs of infection. Electronic Signature(s) Signed: 05/31/2022 2:16:41 PM By: Kalman Shan DO Entered By: Kalman Shan on 05/31/2022 13:09:49 Laura Santiago ([id number]) -------------------------------------------------------------------------------- Physician Orders Details Patient Name: Laura Santiago. Date of Service: 05/31/2022 10:30 AM Medical Record Number: [id number] Patient Account Number: [id number] Date of Birth/Sex: [birth year] (76 y.o. F) Treating RN: Alycia Rossetti Primary Care Provider: [facility] Other Clinician: Referring Provider: [facility] Treating Provider/Extender: Yaakov Guthrie in Treatment: 83 Verbal / Phone Orders: No Diagnosis Coding Follow-up Appointments o Return Appointment in 1 week. o Nurse Visit as needed [facility] for wound care. May utilize formulary equivalent dressing for wound treatment orders unless otherwise specified. Home Health Nurse may visit PRN to address patientos wound care needs. o **Please direct any NON-WOUND related issues/requests for orders to patient's Primary Care Physician. **If current dressing causes regression in wound condition, may D/C  ordered dressing product/s and apply Normal Saline Moist Dressing daily until next Roy or Other MD appointment. **Notify Wound Healing Center of regression in wound condition at [phone]. o Other Home Health Orders/Instructions: - Dressing change 3 x weekly, once by wound care and once at wound clinic weekly. PLEASE make  sure frequency between dressing changes is appropriate. Bathing/ Shower/ Hygiene o May shower with wound dressing protected with water repellent cover or cast protector. o No tub bath. Anesthetic (Use 'Patient Medications' Section for Anesthetic Order Entry) o Lidocaine applied to wound bed Edema Control - Lymphedema / Segmental Compressive Device / Other o Optional: One layer of unna paste to top of compression wrap (to act as an anchor). - PLEASE when applying wrap start from toes and go up to just below knee o Elevate, Exercise Daily and Avoid Standing for Long Periods of Time. o Elevate legs to the level of the heart and pump ankles as often as possible o Elevate leg(s) parallel to the floor when sitting. o DO YOUR BEST to sleep in the bed at night. DO NOT sleep in your recliner. Long hours of sitting in a recliner leads to swelling of the legs and/or potential wounds on your backside. Additional Orders / Instructions o Follow Nutritious Diet and Increase Protein Intake Wound Treatment Wound #1 - Lower Leg Wound Laterality: Left, Medial Cleanser: Wound Cleanser (Home Health) 3 x Per Week/30 Days Discharge Instructions: Wash your hands with soap and water. Remove old dressing, discard into plastic bag and place into trash. Cleanse the wound with Wound Cleanser prior to applying a clean dressing using gauze sponges, not tissues or cotton balls. Do not scrub or use excessive force. Pat dry using gauze sponges, not tissue or cotton balls. Peri-Wound Care: Nystatin Cream USP 30 (g) 3 x Per Week/30 Days Discharge Instructions: Use  Nystatin Cream as directed. MIx 1:1 with TCA cream and applied to peri wound Peri-Wound Care: Triamcinolone Acetonide Cream, 0.1%, 15 (g) tube 3 x Per Week/30 Days Discharge Instructions: Apply as directed. mix 1:1 with nystatin cream for peri wound Primary Dressing: Cutimed Sorbact 1.5x 2.38 (in/in) 3 x Per Week/30 Days Discharge Instructions: A bacteria- and fungi binding wound dressing, suitable for cavities and fistulas. It is suitable as a wound filler and allows the passage of wound exudate into a secondary dressing. The dressing helps reducing odor and pain and can improve healing. Primary Dressing: keystone compound 3 x Per Week/30 Days Discharge Instructions: apply to wound bed as directed on medication label PLEASE follow direction on pill bottle for proper mixing Secondary Dressing: Zetuvit Absorbent Pad, 4x8 (in/in) 3 x Per Week/30 Days Laura Santiago, Laura Santiago. ([id number]) Compression Wrap: 3-LAYER WRAP - Profore Lite LF 3 Multilayer Compression Bandaging System 3 x Per Week/30 Days Discharge Instructions: Apply 3 multi-layer wrap as prescribed. Electronic Signature(s) Signed: 05/31/2022 2:16:41 PM By: Kalman Shan DO Entered By: Kalman Shan on 05/31/2022 13:14:59 Laura Santiago ([id number]) -------------------------------------------------------------------------------- Problem List Details Patient Name: Laura Santiago, Laura Santiago. Date of Service: 05/31/2022 10:30 AM Medical Record Number: [id number] Patient Account Number: [id number] Date of Birth/Sex: [birth year] (76 y.o. F) Treating RN: Alycia Rossetti Primary Care Provider: 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 11/02/2021 No Yes extremity I87.2 Venous insufficiency (chronic) (peripheral) 07/06/2021 No Yes Z79.01 Long term (current) use of anticoagulants 07/06/2021 No Yes I10 Essential (primary) hypertension 07/06/2021 No Yes 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 07/06/2021 No Yes Inactive Problems ICD-10 Code Description Active Date Inactive Date S81.802A Unspecified open wound, left lower leg,  initial encounter 07/06/2021 07/06/2021 S91.101A Unspecified open wound of right great toe without damage to nail, initial 08/24/2021 08/24/2021 encounter S91.104A Unspecified open wound of right lesser toe(s) without damage to nail, initial 08/24/2021 08/24/2021 encounter Resolved Problems ICD-10 Code Description Active Date Resolved Date S91.104D Unspecified open wound of right lesser toe(s) without damage to nail, 08/31/2021 08/31/2021 subsequent encounter ZOEI, AMISON ([id number]) S91.201D Unspecified open wound of right great toe with damage to nail, subsequent 08/31/2021 08/31/2021 encounter Electronic Signature(s) Signed: 05/31/2022 2:16:41 PM By: Kalman Shan DO Previous Signature: 05/31/2022 11:58:41 AM Version By: Alycia Rossetti Entered By: Kalman Shan on 05/31/2022 13:08:35 Laura Santiago ([id number]) -------------------------------------------------------------------------------- Progress Note Details Patient Name: Laura Santiago. Date of Service: 05/31/2022 10:30 AM Medical Record Number: [id number] Patient Account Number: [id number] Date of Birth/Sex: [birth year] (76 y.o. F) Treating RN: Alycia Rossetti Primary Care Provider: [facility] Other Clinician: Referring Provider: [facility] Treating Provider/Extender: Yaakov Guthrie in Treatment: 12 Subjective Chief Complaint Information obtained from Patient Left lower extremity wound Right toe wounds Left upper lateral thigh wounds History of Present  Illness (HPI) Admission 7/27 Ms. Keeghan Mcintire is a 76 year old female with a past medical history of ADHD, metastatic breast cancer, stage IV chronic kidney disease, history of DVT on Xarelto and chronic venous insufficiency that presents to the clinic for a chronic left lower extremity wound. She recently moved to [facility] 4 days ago. She was being followed by wound care center in [name]. She reports a 10-year history of wounds to her left lower extremity that eventually do heal with debridement and compression therapy. She states that the current wound reopened 4 months ago and she is using Vaseline and Coban. She denies signs of infection. 8/3; patient presents for 1 week follow-up. She reports no issues or complaints today. She states she had vascular studies done in the last week. She denies signs of infection. She brought her little service dog with her today. 8/17; patient presents for follow-up. She has missed her last clinic appointment. She states she took the wrap off and attempted to rewrap her leg. She is having difficulty with transportation. She has her service dog with her today. Overall she feels well and reports improvement in wound healing. She denies signs of infection. She reports owning an old Velcro wrap compression and has this at her living facility 9/14; patient presents for follow-up. Patient states that over the past 2 to 3 weeks she developed toe wounds to her right foot. She attributes this to tight fitting shoes. She subsequently developed cellulitis in the right leg and has been treated by doxycycline by her oncologist. She reports improvement in symptoms however continues to have some redness and swelling to this leg. To the left lower extremity patient has been having her wraps changed with home health twice weekly. She states that the [facility] is not helping control the drainage. Other than that she has no issues or complaints today. She  denies signs of infection to the left lower extremity. 9/21; patient presents for follow-up. She reports seeing infectious disease for her cellulitis. She reports no further management. She has home health that changes the wraps twice weekly. She has no issues or complaints today. She denies signs of infection. 10/5; patient presents for follow-up. She has no issues or complaints today. She denies signs of infection. She states that the right great toe has not been dressed by home health. 10/12; patient presents for follow-up. She has no issues  or complaints today. She reports improvement in her wound healing. She has been using silver alginate to the right great toe wound. She denies signs of infection. 10/26; patient presents for follow-up. Home health did not have sorbact so they continued to use Hydrofera Blue under the wrap. She has been using silver alginate to the great toe wound however she did not have a dressing in place today. She currently denies signs of infection. 11/2; patient presents for follow-up. She has been using sorb act under the compression wrap. She reports using silver alginate to the toe wound again she does not have a dressing in place. She currently denies signs of infection. 11/23; patient presents for follow-up. Unfortunately she has missed her last 2 clinic appointments. She was last seen 3 weeks ago. She did her own compression wrap with Kerlix and Coban yesterday after seeing vein and vascular. She has not been dressing her right great toe wound. She currently denies signs of infection. 11/30; patient presents for 1 week follow-up. She states she changed her dressing last week prior to home health and use sorb act with Dakin's and Hydrofera Blue. Home health has changed the dressing as well and they have been using sorbact. Today she reports increased redness to her right lower extremity. She has a history of cellulitis to this leg. She has been using silver alginate  to the right great toe. Unfortunately she had an episode of diarrhea prior to coming in and had feces all over the right leg and to the wrap of her left leg. 12/7; patient presents for 1 week follow-up. She states that home health did not come out to change the dressing and she took it off yesterday. It is unclear if she is dressing the right toe wound. She denies signs of infection. 12/14; patient presents for 1 week follow-up. She has no issues or complaints today. 12/21; patient presents for follow-up. She has no issues or complaints today. She denies signs of infection. 12/28/2021; patient presents for follow-up. She was hospitalized for sepsis secondary to right lower extremity cellulitis On 12/23. She states she is currently at a SNF. She states that she was started on doxycycline this morning for her right great toe swelling and redness. She is not sure what dressings have been done to her left lower extremity for the past 3 weeks. She says its been mainly gauze with an Ace wrap. 1/25; patient presents for follow-up. She is still residing in a skilled nursing facility. She reports mild pain to the left lower extremity wound bed. She states she is going to see a podiatrist soon. 2/8; patient presents for follow-up. She has moved back to her residential community from her skilled nursing facility. She has no issues or complaints today. She denies signs of systemic infections. Laura Santiago, Laura Santiago ([id number]) 2/15; patient presents for follow-up. He has no issues or complaints today. She denies systemic signs of infection. 2/22; patient presents for follow-up. She has no issues or complaints today. She denies signs of infection. 3/1; patient presents for follow-up. She states that home health came out the day after she was seen in our clinic and yesterday to do the wrap change. She denies signs of infection. She reports excoriated skin on the ankle. 3/8; patient presents for follow-up. She has no  issues or complaints today. She denies signs of infection. 3/15; patient presents for follow-up. Home health has been coming out to change the dressings. She reports more tenderness to the wound site. She  denies purulent drainage, increased warmth or erythema to the area. 4/5; patient presents for follow-up. She has missed her last 2 clinic appointments. I have not seen her in 3 weeks. She was recently hospitalized for altered mental status. She was involuntarily committed. She was evaluated by psychiatry and deemed to have competency. There was no specific cause of her altered mental status. It was concluded that her physical and mental health were declining due to her chronic medical conditions. Currently home health has been coming out for dressing changes. Patient has also been doing her own dressing changes. She reports more skin breakdown to the periwound and now has a new wound. She denies fever/chills. She reports continued tenderness to the wound site. 4/12; patient with significant venous insufficiency and a large wound on her left lower leg taking up about 80% of the circumference of her lower leg. Cultures of this grew MRSA and Pseudomonas. She had completed a course of ciprofloxacin now is starting doxycycline. She has been using Dakin's wet-to-dry and a Tubigrip. She has home health twice a week and we change it once. 4/19; patient presents for follow-up. She completed her course of doxycycline. She has been using Dakin's wet-to-dry dressing and Tubigrip. Home health changes the dressing twice weekly. Currently she has no issues or complaints. 4/26; patient presents for follow-up. At last clinic visit orders for home health were Iodosorb under compression therapy. Unfortunately they did not have the dressing and have been using Dakin's and gentamicin under the wrap. Patient currently denies signs of infection. She has no issues or complaints today. 5/3; patient presents for follow-up.  Again Iodosorb has not been used under the compression therapy when home health comes out to change the wrap and dressing. They have been using Sorbact. It is unclear why this is happening since we send orders weekly to the agency. She denies signs of infection. Patient has not purchased the [place] antibiotics. We reached out to the company and they said they have been trying to contact her on a regular basis. We gave the patient the number to call to order the medication. 5/10; patient presents for follow-up. She has no issues or complaints today. Again home health has not been using Iodosorb. Mepilex was on the wound bed. No other dressings noted. She brought in her Keystone antibiotics. She denies signs of infection. 5/17; patient presents for follow-up. Home health has come out twice since she was last seen. Joint well she has been using Keystone antibiotic with Sorbact under the compression wrap. She has no issues or complaints today. She denies signs of infection. 5/24; patient presents for follow-up. We have been using Keystone antibiotics with Sorbact under compression therapy. She is tolerating the treatment well. She is reporting improvement in wound healing. She denies signs of infection. 5/31; patient presents for follow-up. We continue to do [facility] antibiotics with Sorbact under compression therapy. She continues to report improvement in wound healing. Home health comes out and changes the dressing once weekly. 05-17-2022 upon evaluation today patient appears to be doing better in regard to her wound especially compared to the last time I saw her. Fortunately I do think that she is seeing improvements. With that being said I do believe that she may be benefit from sharp debridement today to clear away some of the necrotic debris I discussed that with her as well. She is an amendable to that plan. Otherwise she is very pleased with how the [employer] is doing for her. 6/14; patient  presents for follow-up. We have been using Keystone antibiotic with Sorbact and absorbent dressings under 3 layer compression. She has no issues or complaints today. She reports improvement in wound healing. She denies signs of infection. 6/21; patient presents for follow-up. We are continuing with [facility] antibiotic and Sorbact under 3 layer compression. Patient has no complaints. Continued wound healing is happening. She denies signs of infection. Objective Constitutional Vitals Time Taken: 10:35 AM, Height: 66 in, Source: Stated, Weight: 153 lbs, BMI: 24.7, Temperature: 98.2 F, Pulse: 69 bpm, Respiratory Rate: 16 breaths/min, Blood Pressure: 143/75 mmHg. General Notes: Left lower extremity: 2 wounds divided by islands of epithelization. The open areas have granulation tissue slough and biofilm. No surrounding signs of infection. Integumentary (Hair, Skin) Laura Santiago, Laura Santiago. ([id number]) Wound #1 status is Open. Original cause of wound was Gradually Appeared. The date acquired was: 04/06/2021. The wound has been in treatment 47 weeks. The wound is located on the Left,Medial Lower Leg. The wound measures 8.4cm length x 11cm width x 0.2cm depth; 72.571cm^2 area and 14.514cm^3 volume. There is Fat Layer (Subcutaneous Tissue) exposed. There is a medium amount of serosanguineous drainage noted. The wound margin is flat and intact. There is medium (34-66%) red, pink granulation within the wound bed. There is a medium (34-66%) amount of necrotic tissue within the wound bed. 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 I  debrided Nonviable tissue. I recommended continuing the course with Keystone antibiotic and Sorbact under compression therapy. 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 There was a Excisional Skin/Subcutaneous Tissue Debridement with a total area of 92.4 sq cm performed by Kalman Shan, MD. With the following instrument(s): Curette to remove Viable and Non-Viable tissue/material. Material removed includes Subcutaneous Tissue and Biofilm and after achieving pain control using Lidocaine 4% Topical Solution. No specimens were taken. A time out was conducted at 11:06, prior to the start of the procedure. A Minimum amount of bleeding was controlled with Pressure. The procedure was tolerated well with a pain level of 0 throughout and a pain level of 0 following the procedure. Post Debridement Measurements: 8.4cm length x 11cm width x 0.2cm depth; 14.514cm^3 volume. Character of Wound/Ulcer Post Debridement is improved. Severity of Tissue Post Debridement is: Fat layer exposed. Post procedure Diagnosis Wound #1: Same as Pre-Procedure Plan Follow-up Appointments: Return Appointment in 1 week. Nurse Visit as needed Home Health: [place]: - [place] for wound care. May utilize formulary equivalent dressing for wound treatment orders unless otherwise specified. Home Health Nurse may visit PRN to address patient s wound care needs. **Please direct any NON-WOUND related issues/requests for orders to patient's Primary Care Physician. **If current dressing causes regression in wound condition, may D/C ordered dressing product/s and apply Normal Saline Moist Dressing daily until next [place] or Other MD appointment. **Notify [facility] of regression in wound condition at [phone]. Other Home Health  Orders/Instructions: - Dressing change 3 x weekly, once by wound care and once at wound clinic weekly. PLEASE make sure frequency between dressing changes is appropriate. Bathing/ Shower/ Hygiene: May  shower with wound dressing protected with water repellent cover or cast protector. No tub bath. Anesthetic (Use 'Patient Medications' Section for Anesthetic Order Entry): Lidocaine applied to wound bed Edema Control - Lymphedema / Segmental Compressive Device / Other: Optional: One layer of unna paste to top of compression wrap (to act as an anchor). - PLEASE when applying wrap start from toes and go up to just below knee Elevate, Exercise Daily and Avoid Standing for Long Periods of Time. Elevate legs to the level of the heart and pump ankles as often as possible Elevate leg(s) parallel to the floor when sitting. DO YOUR BEST to sleep in the bed at night. DO NOT sleep in your recliner. Long hours of sitting in a recliner leads to swelling of the legs and/or potential wounds on your backside. Laura Santiago, Laura Santiago. ([id number]) Additional Orders / Instructions: Follow Nutritious Diet and Increase Protein Intake WOUND #1: - Lower Leg Wound Laterality: Left, Medial Cleanser: Wound Cleanser (Home Health) 3 x Per Week/30 Days Discharge Instructions: Wash your hands with soap and water. Remove old dressing, discard into plastic bag and place into trash. Cleanse the wound with Wound Cleanser prior to applying a clean dressing using gauze sponges, not tissues or cotton balls. Do not scrub or use excessive force. Pat dry using gauze sponges, not tissue or cotton balls. Peri-Wound Care: Nystatin Cream USP 30 (g) 3 x Per Week/30 Days Discharge Instructions: Use Nystatin Cream as directed. MIx 1:1 with TCA cream and applied to peri wound Peri-Wound Care: Triamcinolone Acetonide Cream, 0.1%, 15 (g) tube 3 x Per Week/30 Days Discharge Instructions: Apply as directed. mix 1:1 with nystatin cream for peri  wound Primary Dressing: Cutimed Sorbact 1.5x 2.38 (in/in) 3 x Per Week/30 Days Discharge Instructions: A bacteria- and fungi binding wound dressing, suitable for cavities and fistulas. It is suitable as a wound filler and allows the passage of wound exudate into a secondary dressing. The dressing helps reducing odor and pain and can improve healing. Primary Dressing: keystone compound 3 x Per Week/30 Days Discharge Instructions: apply to wound bed as directed on medication label PLEASE follow direction on pill bottle for proper mixing Secondary Dressing: Zetuvit Absorbent Pad, 4x8 (in/in) 3 x Per Week/30 Days Compression Wrap: 3-LAYER WRAP - Profore Lite LF 3 Multilayer Compression Bandaging System 3 x Per Week/30 Days Discharge Instructions: Apply 3 multi-layer wrap as prescribed. 1. In office sharp debridement 2. Sorbact with Keystone antibiotic under 3 layer compression 3. Follow-up in 1 week Electronic Signature(s) Signed: 05/31/2022 2:16:41 PM By: Kalman Shan DO Entered By: Kalman Shan on 05/31/2022 13:14:18 Laura Santiago ([id number]) -------------------------------------------------------------------------------- SuperBill Details Patient Name: Laura Santiago. Date of Service: 05/31/2022 Medical Record Number: [id number] Patient Account Number: [id number] Date of Birth/Sex: [birth year] (76 y.o. F) Treating RN: Alycia Rossetti Primary Care Provider: [facility] Other Clinician: Referring Provider: [facility] Treating Provider/Extender: Yaakov Guthrie in Treatment: 47 Diagnosis Coding ICD-10 Codes Code Description [phone] 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 I87.2 Venous insufficiency (chronic) (peripheral) Z79.01 Long term (current) use of anticoagulants I10 Essential (primary) hypertension C79.81 Secondary  malignant neoplasm of breast Facility Procedures CPT4 Code: 25003704 Description: 88891 - DEB SUBQ TISSUE 20 SQ CM/< Modifier: Quantity: 1 CPT4 Code: Description: ICD-10 Diagnosis Description L97.822 Non-pressure chronic ulcer of other part of left lower leg with fat layer I87.312 Chronic venous hypertension (idiopathic) with ulcer  of left lower extremi Modifier: exposed ty Quantity: CPT4 Code: 16109604 Description: 54098 - DEB SUBQ TISS EA ADDL 20CM Modifier: Quantity: 4 CPT4 Code: Description: ICD-10 Diagnosis Description L97.822 Non-pressure chronic ulcer of other part of left lower leg with fat layer I87.312 Chronic venous hypertension (idiopathic) with ulcer of left lower extremi Modifier: exposed ty Quantity: Physician Procedures CPT4 Code: 1191478 Description: 11042 - WC PHYS SUBQ TISS 20 SQ CM Modifier: Quantity: 1 CPT4 Code: Description: ICD-10 Diagnosis Description L97.822 Non-pressure chronic ulcer of other part of left lower leg with fat layer I87.312 Chronic venous hypertension (idiopathic) with ulcer of left lower extremit Modifier: exposed y Quantity: CPT4 Code: 2956213 Description: 11045 - WC PHYS SUBQ TISS EA ADDL 20 CM Modifier: Quantity: 4 CPT4 Code: Description: ICD-10 Diagnosis Description L97.822 Non-pressure chronic ulcer of other part of left lower leg with fat layer I87.312 Chronic venous hypertension (idiopathic) with ulcer of left lower extremit Modifier: exposed y Quantity: Electronic Signature(s) Signed: 05/31/2022 2:16:41 PM By: Kalman Shan DO Previous Signature: 05/31/2022 11:58:04 AM Version By: Alycia Rossetti Entered By: Kalman Shan on 05/31/2022 13:14:46

## 2022-06-06 ENCOUNTER — Inpatient Hospital Stay: Payer: Medicare Other

## 2022-06-06 ENCOUNTER — Encounter: Payer: Self-pay | Admitting: Oncology

## 2022-06-06 ENCOUNTER — Inpatient Hospital Stay (HOSPITAL_BASED_OUTPATIENT_CLINIC_OR_DEPARTMENT_OTHER): Payer: Medicare Other | Admitting: Oncology

## 2022-06-06 VITALS — BP 135/61 | HR 68 | Resp 18

## 2022-06-06 VITALS — BP 116/59 | HR 70 | Temp 98.7°F | Resp 18 | Wt 147.0 lb

## 2022-06-06 DIAGNOSIS — Z79899 Other long term (current) drug therapy: Secondary | ICD-10-CM | POA: Diagnosis not present

## 2022-06-06 DIAGNOSIS — Z5111 Encounter for antineoplastic chemotherapy: Secondary | ICD-10-CM | POA: Diagnosis not present

## 2022-06-06 DIAGNOSIS — Z5112 Encounter for antineoplastic immunotherapy: Secondary | ICD-10-CM

## 2022-06-06 DIAGNOSIS — C50919 Malignant neoplasm of unspecified site of unspecified female breast: Secondary | ICD-10-CM | POA: Diagnosis not present

## 2022-06-06 LAB — CBC WITH DIFFERENTIAL/PLATELET
Abs Immature Granulocytes: 0.01 10*3/uL (ref 0.00–0.07)
Basophils Absolute: 0 10*3/uL (ref 0.0–0.1)
Basophils Relative: 0 %
Eosinophils Absolute: 0.2 10*3/uL (ref 0.0–0.5)
Eosinophils Relative: 3 %
HCT: 31.3 % — ABNORMAL LOW (ref 36.0–46.0)
Hemoglobin: 9.3 g/dL — ABNORMAL LOW (ref 12.0–15.0)
Immature Granulocytes: 0 %
Lymphocytes Relative: 15 %
Lymphs Abs: 0.9 10*3/uL (ref 0.7–4.0)
MCH: 26.3 pg (ref 26.0–34.0)
MCHC: 29.7 g/dL — ABNORMAL LOW (ref 30.0–36.0)
MCV: 88.4 fL (ref 80.0–100.0)
Monocytes Absolute: 0.5 10*3/uL (ref 0.1–1.0)
Monocytes Relative: 8 %
Neutro Abs: 4.6 10*3/uL (ref 1.7–7.7)
Neutrophils Relative %: 74 %
Platelets: 199 10*3/uL (ref 150–400)
RBC: 3.54 MIL/uL — ABNORMAL LOW (ref 3.87–5.11)
RDW: 16.6 % — ABNORMAL HIGH (ref 11.5–15.5)
WBC: 6.2 10*3/uL (ref 4.0–10.5)
nRBC: 0 % (ref 0.0–0.2)

## 2022-06-06 LAB — COMPREHENSIVE METABOLIC PANEL
ALT: 12 U/L (ref 0–44)
AST: 20 U/L (ref 15–41)
Albumin: 3.4 g/dL — ABNORMAL LOW (ref 3.5–5.0)
Alkaline Phosphatase: 71 U/L (ref 38–126)
Anion gap: 7 (ref 5–15)
BUN: 32 mg/dL — ABNORMAL HIGH (ref 8–23)
CO2: 27 mmol/L (ref 22–32)
Calcium: 8.9 mg/dL (ref 8.9–10.3)
Chloride: 104 mmol/L (ref 98–111)
Creatinine, Ser: 1.42 mg/dL — ABNORMAL HIGH (ref 0.44–1.00)
GFR, Estimated: 38 mL/min — ABNORMAL LOW (ref >60)
Glucose, Bld: 120 mg/dL — ABNORMAL HIGH (ref 70–99)
Potassium: 4.1 mmol/L (ref 3.5–5.1)
Sodium: 138 mmol/L (ref 135–145)
Total Bilirubin: 0.3 mg/dL (ref 0.3–1.2)
Total Protein: 7.1 g/dL (ref 6.5–8.1)

## 2022-06-06 MED ORDER — ZOLEDRONIC ACID 4 MG/5ML IV CONC
3.0000 mg | INTRAVENOUS | Status: DC
  Administered 2022-06-06: 3 mg via INTRAVENOUS
  Filled 2022-06-06: qty 3.75

## 2022-06-06 MED ORDER — SODIUM CHLORIDE 0.9% FLUSH
10.0000 mL | INTRAVENOUS | Status: DC | PRN
  Administered 2022-06-06: 10 mL
  Filled 2022-06-06: qty 10

## 2022-06-06 MED ORDER — SODIUM CHLORIDE 0.9 % IV SOLN
420.0000 mg | Freq: Once | INTRAVENOUS | Status: AC
  Administered 2022-06-06: 420 mg via INTRAVENOUS
  Filled 2022-06-06: qty 14

## 2022-06-06 MED ORDER — HEPARIN SOD (PORK) LOCK FLUSH 100 UNIT/ML IV SOLN
500.0000 [IU] | Freq: Once | INTRAVENOUS | Status: AC | PRN
  Administered 2022-06-06: 500 [IU]
  Filled 2022-06-06: qty 5

## 2022-06-06 MED ORDER — DIPHENHYDRAMINE HCL 25 MG PO CAPS
50.0000 mg | ORAL_CAPSULE | Freq: Once | ORAL | Status: AC
  Administered 2022-06-06: 50 mg via ORAL
  Filled 2022-06-06: qty 2

## 2022-06-06 MED ORDER — ACETAMINOPHEN 325 MG PO TABS
650.0000 mg | ORAL_TABLET | Freq: Once | ORAL | Status: AC
  Administered 2022-06-06: 650 mg via ORAL
  Filled 2022-06-06: qty 2

## 2022-06-06 MED ORDER — TRASTUZUMAB-DKST CHEMO 150 MG IV SOLR
6.0000 mg/kg | Freq: Once | INTRAVENOUS | Status: AC
  Administered 2022-06-06: 420 mg via INTRAVENOUS
  Filled 2022-06-06: qty 20

## 2022-06-06 MED ORDER — SODIUM CHLORIDE 0.9 % IV SOLN
Freq: Once | INTRAVENOUS | Status: AC
  Filled 2022-06-06: qty 250

## 2022-06-06 NOTE — Progress Notes (Signed)
  Pt states she has noticed new spots on her rt arm and hand a small red spot appeared about three weeks ago; does not itch or cause pain. Stated it started off with small black spots but they have disappeared and just became a red spot.

## 2022-06-06 NOTE — Progress Notes (Signed)
Cr 1.42. Per Dr. Smith Robert, proceed with Zometa 3MG .

## 2022-06-07 ENCOUNTER — Encounter: Payer: Self-pay | Admitting: Hospice and Palliative Medicine

## 2022-06-07 ENCOUNTER — Encounter (HOSPITAL_BASED_OUTPATIENT_CLINIC_OR_DEPARTMENT_OTHER): Payer: Medicare Other | Admitting: Internal Medicine

## 2022-06-07 ENCOUNTER — Other Ambulatory Visit: Payer: Self-pay | Admitting: Oncology

## 2022-06-07 DIAGNOSIS — L97822 Non-pressure chronic ulcer of other part of left lower leg with fat layer exposed: Secondary | ICD-10-CM | POA: Diagnosis not present

## 2022-06-07 DIAGNOSIS — I87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: Secondary | ICD-10-CM | POA: Diagnosis not present

## 2022-06-08 ENCOUNTER — Other Ambulatory Visit: Payer: Medicare Other | Admitting: Student

## 2022-06-09 ENCOUNTER — Encounter: Payer: Self-pay | Admitting: Oncology

## 2022-06-09 MED ORDER — OXYCODONE HCL 15 MG PO TABS
15.0000 mg | ORAL_TABLET | Freq: Four times a day (QID) | ORAL | 0 refills | Status: DC | PRN
Start: 2022-06-09 — End: 2022-07-01

## 2022-06-14 ENCOUNTER — Encounter: Payer: Medicare Other | Attending: Internal Medicine | Admitting: Internal Medicine

## 2022-06-14 ENCOUNTER — Other Ambulatory Visit: Payer: Medicare Other | Admitting: Student

## 2022-06-14 DIAGNOSIS — C79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: Secondary | ICD-10-CM | POA: Insufficient documentation

## 2022-06-14 DIAGNOSIS — Z86718 Personal history of other venous thrombosis and embolism: Secondary | ICD-10-CM | POA: Diagnosis not present

## 2022-06-14 DIAGNOSIS — Z515 Encounter for palliative care: Secondary | ICD-10-CM

## 2022-06-14 DIAGNOSIS — Z7901 Long term (current) use of anticoagulants: Secondary | ICD-10-CM | POA: Diagnosis not present

## 2022-06-14 DIAGNOSIS — I872 Venous insufficiency (chronic) (peripheral): Secondary | ICD-10-CM | POA: Diagnosis not present

## 2022-06-14 DIAGNOSIS — I83029 Varicose veins of left lower extremity with ulcer of unspecified site: Secondary | ICD-10-CM

## 2022-06-14 DIAGNOSIS — L97822 Non-pressure chronic ulcer of other part of left lower leg with fat layer exposed: Secondary | ICD-10-CM | POA: Diagnosis not present

## 2022-06-14 DIAGNOSIS — N184 Chronic kidney disease, stage 4 (severe): Secondary | ICD-10-CM | POA: Insufficient documentation

## 2022-06-14 DIAGNOSIS — I87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: Secondary | ICD-10-CM | POA: Insufficient documentation

## 2022-06-14 DIAGNOSIS — I129 Hypertensive chronic kidney disease with stage 1 through stage 4 chronic kidney disease, or unspecified chronic kidney disease: Secondary | ICD-10-CM | POA: Insufficient documentation

## 2022-06-14 DIAGNOSIS — R52 Pain, unspecified: Secondary | ICD-10-CM

## 2022-06-14 DIAGNOSIS — C50919 Malignant neoplasm of unspecified site of unspecified female breast: Secondary | ICD-10-CM

## 2022-06-14 DIAGNOSIS — F411 Generalized anxiety disorder: Secondary | ICD-10-CM

## 2022-06-14 NOTE — Progress Notes (Unsigned)
Therapist, nutritional Palliative Care Consult Note Telephone: [phone]  Fax: [phone]    Date of encounter: 06/14/22 2:48 PM PATIENT NAME: Laura Santiago [address] [place] [address]   [phone] (home)  DOB: [birth year] MRN: [id number] PRIMARY CARE PROVIDER:    [facility], [address],  [address] [place] [address] [phone]  REFERRING PROVIDER:   [facility] [address] [place],  [place] [address] [phone]  RESPONSIBLE PARTY:    Contact Information     Name Relation Home Work Mobile   [name],[name] Daughter   [phone]   proctor,Shirley Clearence Cheek   [phone]        I met face to face with patient and family in the home. Palliative Care was asked to follow this patient by consultation request of  [facility] to address advance care planning and complex medical decision making. This is a follow up visit.                                   ASSESSMENT AND PLAN / RECOMMENDATIONS:   Advance Care Planning/Goals of Care: Goals include to maximize quality of life and symptom management. Patient/health care surrogate gave his/her permission to discuss. Our advance care planning conversation included a discussion about:    The value and importance of advance care planning  Experiences with loved ones who have been seriously ill or have died  CODE STATUS: DNR  Symptom Management/Plan:  Breast cancer with metastases to lymph node and bone- fentanyl patch 25 mcg every 3 days and oxycodone 15 mg every 4 hours. Feels her pain is currently managed.   Left lower extremity wound-continues with home health.   Needs another caregiver due to clutter  Discussed restarting Cymbalta-states she will try for 2 weeks.   Has lorazepam, but not using it.   Follow up Palliative Care Visit: Palliative care will continue to follow for complex medical decision making, advance care planning, and clarification of  goals. Return *** weeks or prn.  I spent *** minutes providing this consultation. More than 50% of the time in this consultation was spent in counseling and care coordination.  This visit was coded based on medical decision making (MDM).***  PPS: 60%  HOSPICE ELIGIBILITY/DIAGNOSIS: TBD  Chief Complaint: Palliative Medicine follow up visit.   HISTORY OF PRESENT ILLNESS:  Laura MONDA is a 76 y.o. 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 Hx of DVT, CKD 3, ADHD, peripheral neuropathy secondary to chemotherapy.      History obtained from review of EMR, discussion with primary team, and interview with family, facility staff/caregiver and/or Ms. Lalla Brothers.  I reviewed available labs, medications, imaging, studies and related documents from the EMR.  Records reviewed and summarized above.   ROS  *** General: NAD EYES: denies vision changes ENMT: denies dysphagia Cardiovascular: denies chest pain, denies DOE Pulmonary: denies cough, denies increased SOB Abdomen: endorses good appetite, denies constipation, endorses continence of bowel GU: denies dysuria, endorses continence of urine MSK:  denies increased weakness,  no falls reported Skin: denies rashes or wounds Neurological: denies pain, denies insomnia Psych: Endorses positive mood Heme/lymph/immuno: denies bruises, abnormal bleeding  Physical Exam: Current and past weights: Constitutional: NAD General: frail appearing, thin/WNWD/obese  EYES: anicteric sclera, lids intact, no discharge  ENMT: intact hearing, oral  mucous membranes moist, dentition intact CV: S1S2, RRR, no LE edema Pulmonary: LCTA, no increased work of breathing, no cough, room air Abdomen: intake 100%, normo-active BS + 4 quadrants, soft and non tender, no ascites GU: deferred MSK: no sarcopenia, moves all extremities, ambulatory Skin: warm and dry, no rashes or wounds on  visible skin Neuro:  no generalized weakness,  no cognitive impairment Psych: non-anxious affect, A and O x 3 Hem/lymph/immuno: no widespread bruising   Thank you for the opportunity to participate in the care of Laura Santiago.  The palliative care team will continue to follow. Please call our office at [phone] if we can be of additional assistance.   Ezekiel Slocumb, NP   COVID-19 PATIENT SCREENING TOOL Asked and negative response unless otherwise noted:   Have you had symptoms of covid, tested positive or been in contact with someone with symptoms/positive test in the past 5-10 days? No

## 2022-06-15 NOTE — Progress Notes (Signed)
EVANGELIA, WHITAKER ([id number]) Visit Report for 06/14/2022 Chief Complaint Document Details Patient Name: Laura Santiago, Laura Santiago. Date of Service: 06/14/2022 10:30 AM Medical Record Number: [id number] Patient Account Number: [id number] Date of Birth/Sex: [birth year] (76 y.o. F) Treating RN: Levora Dredge Primary Care Provider: [facility], [place] Other Clinician: [name] Referring Provider: [facility] Treating Provider/Extender: Yaakov Guthrie in Treatment: 109 Information Obtained from: Patient Chief Complaint Left lower extremity wound Right toe wounds Left upper lateral thigh wounds Electronic Signature(s) Signed: 06/14/2022 11:52:25 AM By: Kalman Shan DO Entered By: Kalman Shan on 06/14/2022 11:46:26 Laura Santiago ([id number]) -------------------------------------------------------------------------------- Debridement Details Patient Name: Laura Santiago. Date of Service: 06/14/2022 10:30 AM Medical Record Number: [id number] Patient Account Number: [id number] Date of Birth/Sex: 07-06-1946 (76 y.o. F) Treating RN: Levora Dredge Primary Care Provider: [facility], [place] Other Clinician: [name] Referring Provider: [facility] Treating Provider/Extender: Yaakov Guthrie in Treatment: 49 Debridement Performed for Wound #1 Left,Medial Lower Leg Assessment: Performed By: Physician Kalman Shan, MD Debridement Type: Debridement Severity of Tissue Pre Debridement: Fat layer exposed Level of Consciousness (Pre- Awake and Alert procedure): Pre-procedure Verification/Time Out Yes - 11:12 Taken: Start Time: 11:12 Total Area Debrided (L x W): 7.3 (cm) x 10.5 (cm) = 76.65 (cm) Tissue and other material Viable, Non-Viable, Slough, Subcutaneous, Biofilm, Slough debrided: Level: Skin/Subcutaneous Tissue Debridement Description: Excisional Instrument: Curette Bleeding: Minimum Hemostasis Achieved: Pressure End Time:  11:17 Response to Treatment: Procedure was tolerated well Level of Consciousness (Post- Awake and Alert procedure): Post Debridement Measurements of Total Wound Length: (cm) 7.3 Width: (cm) 10.5 Depth: (cm) 0.1 Volume: (cm) 6.02 Character of Wound/Ulcer Post Debridement: Stable Severity of Tissue Post Debridement: Fat layer exposed Post Procedure Diagnosis Same as Pre-procedure Electronic Signature(s) Signed: 06/14/2022 11:52:25 AM By: Kalman Shan DO Signed: 06/14/2022 4:13:21 PM By: Levora Dredge Signed: 06/15/2022 4:13:59 PM By: [name] Entered By: [name] on 06/14/2022 11:18:05 Laura Santiago ([id number]) -------------------------------------------------------------------------------- HPI Details Patient Name: Laura Santiago. Date of Service: 06/14/2022 10:30 AM Medical Record Number: [id number] Patient Account Number: [id number] Date of Birth/Sex: 05-31-1946 (76 y.o. F) Treating RN: Levora Dredge Primary Care Provider: [facility], [place] Other Clinician: [name] Referring Provider: [facility] Treating Provider/Extender: Yaakov Guthrie in Treatment: 69 History of Present Illness HPI Description: Admission 7/27 Ms. Laura Santiago is a 76 year old female with a past medical history of ADHD, metastatic breast cancer, stage IV chronic kidney disease, history of DVT on Xarelto and chronic venous insufficiency that presents to the clinic for a chronic left lower extremity wound. She recently moved to [facility] 4 days ago. She was being followed by wound care center in [name]. She reports a 10-year history of wounds to her left lower extremity that eventually do heal with debridement and compression therapy. She states that the current wound reopened 4 months ago and she is using Vaseline and Coban. She denies signs of infection. 8/3; patient presents for 1 week follow-up. She reports no issues or complaints today. She  states she had vascular studies done in the last week. She denies signs of infection. She brought her little service dog with her today. 8/17; patient presents for follow-up. She has missed her last clinic appointment. She states she took the wrap off and attempted to rewrap her leg. She is having difficulty with transportation. She has her service dog with her today. Overall she feels well and reports improvement in wound healing. She denies signs of infection. She reports  owning an old Velcro wrap compression and has this at her living facility 9/14; patient presents for follow-up. Patient states that over the past 2 to 3 weeks she developed toe wounds to her right foot. She attributes this to tight fitting shoes. She subsequently developed cellulitis in the right leg and has been treated by doxycycline by her oncologist. She reports improvement in symptoms however continues to have some redness and swelling to this leg. To the left lower extremity patient has been having her wraps changed with home health twice weekly. She states that the [facility] is not helping control the drainage. Other than that she has no issues or complaints today. She denies signs of infection to the left lower extremity. 9/21; patient presents for follow-up. She reports seeing infectious disease for her cellulitis. She reports no further management. She has home health that changes the wraps twice weekly. She has no issues or complaints today. She denies signs of infection. 10/5; patient presents for follow-up. She has no issues or complaints today. She denies signs of infection. She states that the right great toe has not been dressed by home health. 10/12; patient presents for follow-up. She has no issues or complaints today. She reports improvement in her wound healing. She has been using silver alginate to the right great toe wound. She denies signs of infection. 10/26; patient presents for follow-up. Home health  did not have sorbact so they continued to use Hydrofera Blue under the wrap. She has been using silver alginate to the great toe wound however she did not have a dressing in place today. She currently denies signs of infection. 11/2; patient presents for follow-up. She has been using sorb act under the compression wrap. She reports using silver alginate to the toe wound again she does not have a dressing in place. She currently denies signs of infection. 11/23; patient presents for follow-up. Unfortunately she has missed her last 2 clinic appointments. She was last seen 3 weeks ago. She did her own compression wrap with Kerlix and Coban yesterday after seeing vein and vascular. She has not been dressing her right great toe wound. She currently denies signs of infection. 11/30; patient presents for 1 week follow-up. She states she changed her dressing last week prior to home health and use sorb act with Dakin's and Hydrofera Blue. Home health has changed the dressing as well and they have been using sorbact. Today she reports increased redness to her right lower extremity. She has a history of cellulitis to this leg. She has been using silver alginate to the right great toe. Unfortunately she had an episode of diarrhea prior to coming in and had feces all over the right leg and to the wrap of her left leg. 12/7; patient presents for 1 week follow-up. She states that home health did not come out to change the dressing and she took it off yesterday. It is unclear if she is dressing the right toe wound. She denies signs of infection. 12/14; patient presents for 1 week follow-up. She has no issues or complaints today. 12/21; patient presents for follow-up. She has no issues or complaints today. She denies signs of infection. 12/28/2021; patient presents for follow-up. She was hospitalized for sepsis secondary to right lower extremity cellulitis On 12/23. She states she is currently at a SNF. She states  that she was started on doxycycline this morning for her right great toe swelling and redness. She is not sure what dressings have been done to  her left lower extremity for the past 3 weeks. She says its been mainly gauze with an Ace wrap. 1/25; patient presents for follow-up. She is still residing in a skilled nursing facility. She reports mild pain to the left lower extremity wound bed. She states she is going to see a podiatrist soon. 2/8; patient presents for follow-up. She has moved back to her residential community from her skilled nursing facility. She has no issues or complaints today. She denies signs of systemic infections. 2/15; patient presents for follow-up. He has no issues or complaints today. She denies systemic signs of infection. 2/22; patient presents for follow-up. She has no issues or complaints today. She denies signs of infection. Laura Santiago, Laura Santiago ([id number]) 3/1; patient presents for follow-up. She states that home health came out the day after she was seen in our clinic and yesterday to do the wrap change. She denies signs of infection. She reports excoriated skin on the ankle. 3/8; patient presents for follow-up. She has no issues or complaints today. She denies signs of infection. 3/15; patient presents for follow-up. Home health has been coming out to change the dressings. She reports more tenderness to the wound site. She denies purulent drainage, increased warmth or erythema to the area. 4/5; patient presents for follow-up. She has missed her last 2 clinic appointments. I have not seen her in 3 weeks. She was recently hospitalized for altered mental status. She was involuntarily committed. She was evaluated by psychiatry and deemed to have competency. There was no specific cause of her altered mental status. It was concluded that her physical and mental health were declining due to her chronic medical conditions. Currently home health has been coming out for dressing  changes. Patient has also been doing her own dressing changes. She reports more skin breakdown to the periwound and now has a new wound. She denies fever/chills. She reports continued tenderness to the wound site. 4/12; patient with significant venous insufficiency and a large wound on her left lower leg taking up about 80% of the circumference of her lower leg. Cultures of this grew MRSA and Pseudomonas. She had completed a course of ciprofloxacin now is starting doxycycline. She has been using Dakin's wet-to-dry and a Tubigrip. She has home health twice a week and we change it once. 4/19; patient presents for follow-up. She completed her course of doxycycline. She has been using Dakin's wet-to-dry dressing and Tubigrip. Home health changes the dressing twice weekly. Currently she has no issues or complaints. 4/26; patient presents for follow-up. At last clinic visit orders for home health were Iodosorb under compression therapy. Unfortunately they did not have the dressing and have been using Dakin's and gentamicin under the wrap. Patient currently denies signs of infection. She has no issues or complaints today. 5/3; patient presents for follow-up. Again Iodosorb has not been used under the compression therapy when home health comes out to change the wrap and dressing. They have been using Sorbact. It is unclear why this is happening since we send orders weekly to the agency. She denies signs of infection. Patient has not purchased the [place] antibiotics. We reached out to the company and they said they have been trying to contact her on a regular basis. We gave the patient the number to call to order the medication. 5/10; patient presents for follow-up. She has no issues or complaints today. Again home health has not been using Iodosorb. Mepilex was on the wound bed. No other dressings noted. She brought in  her [facility] antibiotics. She denies signs of infection. 5/17; patient presents for  follow-up. Home health has come out twice since she was last seen. Joint well she has been using Keystone antibiotic with Sorbact under the compression wrap. She has no issues or complaints today. She denies signs of infection. 5/24; patient presents for follow-up. We have been using [facility] antibiotics with Sorbact under compression therapy. She is tolerating the treatment well. She is reporting improvement in wound healing. She denies signs of infection. 5/31; patient presents for follow-up. We continue to do [facility] antibiotics with Sorbact under compression therapy. She continues to report improvement in wound healing. Home health comes out and changes the dressing once weekly. 05-17-2022 upon evaluation today patient appears to be doing better in regard to her wound especially compared to the last time I saw her. Fortunately I do think that she is seeing improvements. With that being said I do believe that she may be benefit from sharp debridement today to clear away some of the necrotic debris I discussed that with her as well. She is an amendable to that plan. Otherwise she is very pleased with how the [employer] is doing for her. 6/14; patient presents for follow-up. We have been using Keystone antibiotic with Sorbact and absorbent dressings under 3 layer compression. She has no issues or complaints today. She reports improvement in wound healing. She denies signs of infection. 6/21; patient presents for follow-up. We are continuing with [facility] antibiotic and Sorbact under 3 layer compression. Patient has no complaints. Continued wound healing is happening. She denies signs of infection. 6/28; patient presents for follow-up. We have been using Keystone antibiotic with Sorbact under 3 layer compression. Usually home health comes out and changes the dressing twice a week. Unfortunately they did not go out to change the dressing. It is unclear why. Patient did not call them. She currently denies  signs of infection. 7/5; patient presents for follow-up. We have been using Keystone antibiotic with calcium alginate under 3 layer compression. She reports improvement in wound healing. She denies signs of infection. Home health has come out to do dressing changes twice this past week. Electronic Signature(s) Signed: 06/14/2022 11:52:25 AM By: Kalman Shan DO Entered By: Kalman Shan on 06/14/2022 11:47:02 Laura Santiago ([id number]) -------------------------------------------------------------------------------- Physical Exam Details Patient Name: WANIYA, HOGLUND. Date of Service: 06/14/2022 10:30 AM Medical Record Number: [id number] Patient Account Number: [id number] Date of Birth/Sex: August 06, 1946 (76 y.o. F) Treating RN: Levora Dredge Primary Care Provider: [facility], [place] Other Clinician: [name] Referring Provider: [facility] Treating Provider/Extender: Yaakov Guthrie in Treatment: 28 Constitutional . Cardiovascular . Psychiatric . Notes Left lower extremity: 2 wounds divided by islands of epithelization. The open areas have granulation tissue and slough. No surrounding signs of infection. Electronic Signature(s) Signed: 06/14/2022 11:52:25 AM By: Kalman Shan DO Entered By: Kalman Shan on 06/14/2022 11:47:48 Laura Santiago ([id number]) -------------------------------------------------------------------------------- Physician Orders Details Patient Name: Laura Santiago. Date of Service: 06/14/2022 10:30 AM Medical Record Number: [id number] Patient Account Number: [id number] Date of Birth/Sex: 01-12-1946 (76 y.o. F) Treating RN: Levora Dredge Primary Care Provider: [facility], [place] Other Clinician: [name] Referring Provider: [facility] Treating Provider/Extender: Yaakov Guthrie in Treatment: 2 Verbal / Phone Orders: No Diagnosis Coding Follow-up Appointments o Return Appointment in 1  week. o Nurse Visit as needed [place] for wound care. May utilize formulary equivalent dressing for wound treatment orders  unless otherwise specified. Home Health Nurse may visit PRN to address patientos wound care needs. o **Please direct any NON-WOUND related issues/requests for orders to patient's Primary Care Physician. **If current dressing causes regression in wound condition, may D/C ordered dressing product/s and apply Normal Saline Moist Dressing daily until next Kathryn or Other MD appointment. **Notify Wound Healing Center of regression in wound condition at [phone]. o Other Home Health Orders/Instructions: - Dressing change 3 x weekly, once by wound care and once at wound clinic weekly. PLEASE make sure frequency between dressing changes is appropriate. Bathing/ Shower/ Hygiene o May shower with wound dressing protected with water repellent cover or cast protector. o No tub bath. Anesthetic (Use 'Patient Medications' Section for Anesthetic Order Entry) o Lidocaine applied to wound bed Edema Control - Lymphedema / Segmental Compressive Device / Other o Optional: One layer of unna paste to top of compression wrap (to act as an anchor). - PLEASE when applying wrap start from toes and go up to just below knee o Elevate, Exercise Daily and Avoid Standing for Long Periods of Time. o Elevate legs to the level of the heart and pump ankles as often as possible o Elevate leg(s) parallel to the floor when sitting. o DO YOUR BEST to sleep in the bed at night. DO NOT sleep in your recliner. Long hours of sitting in a recliner leads to swelling of the legs and/or potential wounds on your backside. Additional Orders / Instructions o Follow Nutritious Diet and Increase Protein Intake Wound Treatment Wound #1 - Lower Leg Wound Laterality: Left, Medial Cleanser: Wound Cleanser (Home  Health) 3 x Per Week/30 Days Discharge Instructions: Wash your hands with soap and water. Remove old dressing, discard into plastic bag and place into trash. Cleanse the wound with Wound Cleanser prior to applying a clean dressing using gauze sponges, not tissues or cotton balls. Do not scrub or use excessive force. Pat dry using gauze sponges, not tissue or cotton balls. Peri-Wound Care: Nystatin Cream USP 30 (g) 3 x Per Week/30 Days Discharge Instructions: Use Nystatin Cream as directed. MIx 1:1 with TCA cream and applied to peri wound Peri-Wound Care: Triamcinolone Acetonide Cream, 0.1%, 15 (g) tube 3 x Per Week/30 Days Discharge Instructions: Apply as directed. mix 1:1 with nystatin cream for peri wound Primary Dressing: Aquacel Extra Hydrofiber Dressing, 4x5 (in/in) 3 x Per Week/30 Days Primary Dressing: keystone compound 3 x Per Week/30 Days Discharge Instructions: apply to wound bed as directed on medication label PLEASE follow direction on pill bottle for proper mixing Secondary Dressing: Zetuvit Absorbent Pad, 4x8 (in/in) 3 x Per Week/30 Days Compression Wrap: 3-LAYER WRAP - Profore Lite LF 3 Multilayer Compression Bandaging System 3 x Per Week/30 Days Discharge Instructions: Apply 3 multi-layer wrap as prescribed. ALAYLAH, HEATHERINGTON ([id number]) Electronic Signature(s) Signed: 06/14/2022 11:52:25 AM By: Kalman Shan DO Entered By: Kalman Shan on 06/14/2022 11:50:22 Laura Santiago, Laura Santiago ([id number]) -------------------------------------------------------------------------------- Problem List Details Patient Name: ANGELIN, CUTRONE. Date of Service: 06/14/2022 10:30 AM Medical Record Number: [id number] Patient Account Number: [id number] Date of Birth/Sex: [birth year] (76 y.o. F) Treating RN: Levora Dredge Primary Care Provider: [facility], [place] Other Clinician: [name] Referring Provider: [facility] Treating Provider/Extender: Yaakov Guthrie in  Treatment: 16 Active Problems ICD-10 Encounter Code Description Active Date MDM Diagnosis L97.822 Non-pressure chronic ulcer of other part of left lower leg with fat layer 11/02/2021 No Yes exposed I87.312 Chronic venous hypertension (idiopathic) with ulcer of left lower 11/02/2021 No  Yes extremity I87.2 Venous insufficiency (chronic) (peripheral) 07/06/2021 No Yes Z79.01 Long term (current) use of anticoagulants 07/06/2021 No Yes I10 Essential (primary) hypertension 07/06/2021 No Yes 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 07/06/2021 No Yes Inactive Problems ICD-10 Code Description Active Date Inactive Date S81.802A Unspecified open wound, left lower leg, initial encounter 07/06/2021 07/06/2021 S91.101A Unspecified open wound of right great toe without damage to nail, initial 08/24/2021 08/24/2021 encounter S91.104A Unspecified open wound of right lesser toe(s) without damage to nail, initial 08/24/2021 08/24/2021 encounter Resolved Problems ICD-10 Code Description Active Date Resolved Date S91.104D Unspecified open wound of right lesser toe(s) without damage to nail, 08/31/2021 08/31/2021 subsequent encounter ZOEI, AMISON ([id number]) S91.201D Unspecified open wound of right great toe with damage to nail, subsequent 08/31/2021 08/31/2021 encounter Electronic Signature(s) Signed: 06/14/2022 11:52:25 AM By: Kalman Shan DO Entered By: Kalman Shan on 06/14/2022 11:46:22 Laura Santiago ([id number]) -------------------------------------------------------------------------------- Progress Note Details Patient Name: Laura Santiago. Date of Service: 06/14/2022 10:30 AM Medical Record Number: [id number] Patient Account Number: [id number] Date of Birth/Sex: 03/20/1946 (76 y.o. F) Treating RN: Levora Dredge Primary Care Provider: [facility], [place] Other Clinician: [name] Referring Provider: [facility] Treating Provider/Extender: Yaakov Guthrie  in Treatment: 77 Subjective Chief Complaint Information obtained from Patient Left lower extremity wound Right toe wounds Left upper lateral thigh wounds History of Present Illness (HPI) Admission 7/27 Ms. Ember Henrikson is a 76 year old female with a past medical history of ADHD, metastatic breast cancer, stage IV chronic kidney disease, history of DVT on Xarelto and chronic venous insufficiency that presents to the clinic for a chronic left lower extremity wound. She recently moved to [facility] 4 days ago. She was being followed by wound care center in [name]. She reports a 10-year history of wounds to her left lower extremity that eventually do heal with debridement and compression therapy. She states that the current wound reopened 4 months ago and she is using Vaseline and Coban. She denies signs of infection. 8/3; patient presents for 1 week follow-up. She reports no issues or complaints today. She states she had vascular studies done in the last week. She denies signs of infection. She brought her little service dog with her today. 8/17; patient presents for follow-up. She has missed her last clinic appointment. She states she took the wrap off and attempted to rewrap her leg. She is having difficulty with transportation. She has her service dog with her today. Overall she feels well and reports improvement in wound healing. She denies signs of infection. She reports owning an old Velcro wrap compression and has this at her living facility 9/14; patient presents for follow-up. Patient states that over the past 2 to 3 weeks she developed toe wounds to her right foot. She attributes this to tight fitting shoes. She subsequently developed cellulitis in the right leg and has been treated by doxycycline by her oncologist. She reports improvement in symptoms however continues to have some redness and swelling to this leg. To the left lower extremity patient has been having her  wraps changed with home health twice weekly. She states that the [facility] is not helping control the drainage. Other than that she has no issues or complaints today. She denies signs of infection to the left lower extremity. 9/21; patient presents for follow-up. She reports seeing infectious disease for her cellulitis. She reports no further management. She has home health that changes the wraps twice weekly. She has no issues or complaints today.  She denies signs of infection. 10/5; patient presents for follow-up. She has no issues or complaints today. She denies signs of infection. She states that the right great toe has not been dressed by home health. 10/12; patient presents for follow-up. She has no issues or complaints today. She reports improvement in her wound healing. She has been using silver alginate to the right great toe wound. She denies signs of infection. 10/26; patient presents for follow-up. Home health did not have sorbact so they continued to use Hydrofera Blue under the wrap. She has been using silver alginate to the great toe wound however she did not have a dressing in place today. She currently denies signs of infection. 11/2; patient presents for follow-up. She has been using sorb act under the compression wrap. She reports using silver alginate to the toe wound again she does not have a dressing in place. She currently denies signs of infection. 11/23; patient presents for follow-up. Unfortunately she has missed her last 2 clinic appointments. She was last seen 3 weeks ago. She did her own compression wrap with Kerlix and Coban yesterday after seeing vein and vascular. She has not been dressing her right great toe wound. She currently denies signs of infection. 11/30; patient presents for 1 week follow-up. She states she changed her dressing last week prior to home health and use sorb act with Dakin's and Hydrofera Blue. Home health has changed the dressing as well and  they have been using sorbact. Today she reports increased redness to her right lower extremity. She has a history of cellulitis to this leg. She has been using silver alginate to the right great toe. Unfortunately she had an episode of diarrhea prior to coming in and had feces all over the right leg and to the wrap of her left leg. 12/7; patient presents for 1 week follow-up. She states that home health did not come out to change the dressing and she took it off yesterday. It is unclear if she is dressing the right toe wound. She denies signs of infection. 12/14; patient presents for 1 week follow-up. She has no issues or complaints today. 12/21; patient presents for follow-up. She has no issues or complaints today. She denies signs of infection. 12/28/2021; patient presents for follow-up. She was hospitalized for sepsis secondary to right lower extremity cellulitis On 12/23. She states she is currently at a SNF. She states that she was started on doxycycline this morning for her right great toe swelling and redness. She is not sure what dressings have been done to her left lower extremity for the past 3 weeks. She says its been mainly gauze with an Ace wrap. 1/25; patient presents for follow-up. She is still residing in a skilled nursing facility. She reports mild pain to the left lower extremity wound bed. She states she is going to see a podiatrist soon. 2/8; patient presents for follow-up. She has moved back to her residential community from her skilled nursing facility. She has no issues or complaints today. She denies signs of systemic infections. Laura Santiago, Laura Santiago ([id number]) 2/15; patient presents for follow-up. He has no issues or complaints today. She denies systemic signs of infection. 2/22; patient presents for follow-up. She has no issues or complaints today. She denies signs of infection. 3/1; patient presents for follow-up. She states that home health came out the day after she was  seen in our clinic and yesterday to do the wrap change. She denies signs of infection. She reports excoriated  skin on the ankle. 3/8; patient presents for follow-up. She has no issues or complaints today. She denies signs of infection. 3/15; patient presents for follow-up. Home health has been coming out to change the dressings. She reports more tenderness to the wound site. She denies purulent drainage, increased warmth or erythema to the area. 4/5; patient presents for follow-up. She has missed her last 2 clinic appointments. I have not seen her in 3 weeks. She was recently hospitalized for altered mental status. She was involuntarily committed. She was evaluated by psychiatry and deemed to have competency. There was no specific cause of her altered mental status. It was concluded that her physical and mental health were declining due to her chronic medical conditions. Currently home health has been coming out for dressing changes. Patient has also been doing her own dressing changes. She reports more skin breakdown to the periwound and now has a new wound. She denies fever/chills. She reports continued tenderness to the wound site. 4/12; patient with significant venous insufficiency and a large wound on her left lower leg taking up about 80% of the circumference of her lower leg. Cultures of this grew MRSA and Pseudomonas. She had completed a course of ciprofloxacin now is starting doxycycline. She has been using Dakin's wet-to-dry and a Tubigrip. She has home health twice a week and we change it once. 4/19; patient presents for follow-up. She completed her course of doxycycline. She has been using Dakin's wet-to-dry dressing and Tubigrip. Home health changes the dressing twice weekly. Currently she has no issues or complaints. 4/26; patient presents for follow-up. At last clinic visit orders for home health were Iodosorb under compression therapy. Unfortunately they did not have the dressing and  have been using Dakin's and gentamicin under the wrap. Patient currently denies signs of infection. She has no issues or complaints today. 5/3; patient presents for follow-up. Again Iodosorb has not been used under the compression therapy when home health comes out to change the wrap and dressing. They have been using Sorbact. It is unclear why this is happening since we send orders weekly to the agency. She denies signs of infection. Patient has not purchased the [facility] antibiotics. We reached out to the company and they said they have been trying to contact her on a regular basis. We gave the patient the number to call to order the medication. 5/10; patient presents for follow-up. She has no issues or complaints today. Again home health has not been using Iodosorb. Mepilex was on the wound bed. No other dressings noted. She brought in her Keystone antibiotics. She denies signs of infection. 5/17; patient presents for follow-up. Home health has come out twice since she was last seen. Joint well she has been using Keystone antibiotic with Sorbact under the compression wrap. She has no issues or complaints today. She denies signs of infection. 5/24; patient presents for follow-up. We have been using Keystone antibiotics with Sorbact under compression therapy. She is tolerating the treatment well. She is reporting improvement in wound healing. She denies signs of infection. 5/31; patient presents for follow-up. We continue to do [facility] antibiotics with Sorbact under compression therapy. She continues to report improvement in wound healing. Home health comes out and changes the dressing once weekly. 05-17-2022 upon evaluation today patient appears to be doing better in regard to her wound especially compared to the last time I saw her. Fortunately I do think that she is seeing improvements. With that being said I do believe that she  may be benefit from sharp debridement today to clear away some of the  necrotic debris I discussed that with her as well. She is an amendable to that plan. Otherwise she is very pleased with how the [employer] is doing for her. 6/14; patient presents for follow-up. We have been using Keystone antibiotic with Sorbact and absorbent dressings under 3 layer compression. She has no issues or complaints today. She reports improvement in wound healing. She denies signs of infection. 6/21; patient presents for follow-up. We are continuing with [facility] antibiotic and Sorbact under 3 layer compression. Patient has no complaints. Continued wound healing is happening. She denies signs of infection. 6/28; patient presents for follow-up. We have been using Keystone antibiotic with Sorbact under 3 layer compression. Usually home health comes out and changes the dressing twice a week. Unfortunately they did not go out to change the dressing. It is unclear why. Patient did not call them. She currently denies signs of infection. 7/5; patient presents for follow-up. We have been using Keystone antibiotic with calcium alginate under 3 layer compression. She reports improvement in wound healing. She denies signs of infection. Home health has come out to do dressing changes twice this past week. Objective Constitutional Vitals Time Taken: 10:42 AM, Height: 66 in, Weight: 153 lbs, BMI: 24.7, Temperature: 97.7 F, Pulse: 71 bpm, Respiratory Rate: 18 breaths/min, Blood Pressure: 143/72 mmHg. Laura Santiago, Laura Santiago. ([id number]) General Notes: Left lower extremity: 2 wounds divided by islands of epithelization. The open areas have granulation tissue and slough. No surrounding signs of infection. Integumentary (Hair, Skin) Wound #1 status is Open. Original cause of wound was Gradually Appeared. The date acquired was: 04/06/2021. The wound has been in treatment 49 weeks. The wound is located on the Left,Medial Lower Leg. The wound measures 7.3cm length x 10.5cm width x 0.1cm depth; 60.201cm^2 area  and 6.02cm^3 volume. There is Fat Layer (Subcutaneous Tissue) exposed. There is a medium amount of serosanguineous drainage noted. The wound margin is flat and intact. There is medium (34-66%) red, pink granulation within the wound bed. There is a medium (34-66%) amount of necrotic tissue within the wound bed including Adherent Slough. 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 I debrided nonviable tissue. I recommended continuing the course with [facility] antibiotic and calcium alginate under 3 layer compression. 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 There was a Excisional Skin/Subcutaneous Tissue Debridement with a total area of 76.65 sq cm performed by Kalman Shan, MD. With the following instrument(s): Curette to remove Viable and Non-Viable tissue/material. Material removed includes Subcutaneous Tissue, Slough, and Biofilm. A time out was conducted at 11:12, prior to the start of the procedure. A Minimum amount of bleeding was controlled with Pressure. The procedure was tolerated well. Post Debridement Measurements: 7.3cm length x 10.5cm width x 0.1cm depth; 6.02cm^3 volume. Character of Wound/Ulcer Post Debridement is stable. Severity of Tissue Post Debridement is: Fat layer exposed. 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  There was  a Three Layer Compression Therapy Procedure with a pre-treatment ABI of 1.5 by [name]. Post procedure Diagnosis Wound #1: Same as Pre-Procedure Plan Follow-up Appointments: Return Appointment in 1 week. Nurse Visit as needed Home Health: [place]: - [place] for wound care. May utilize formulary equivalent dressing for wound treatment orders unless otherwise specified. Home Health Nurse may visit PRN to address patient s wound care needs. **Please direct any NON-WOUND related issues/requests for orders to patient's Primary Care Physician. **If current dressing causes regression in wound condition, may D/C ordered dressing product/s and apply Normal Saline Moist Dressing daily until next [place] or Other MD appointment. **Notify [facility] of regression in wound condition at [phone]. Other Home Health Orders/Instructions: - Dressing change 3 x weekly, once by wound care and once at wound clinic weekly. PLEASE make sure frequency between dressing changes is appropriate. Bathing/ Shower/ Hygiene: May shower with wound dressing protected with water repellent cover or cast protector. No tub bath. Laura Santiago, Laura Santiago ([id number]) Anesthetic (Use 'Patient Medications' Section for Anesthetic Order Entry): Lidocaine applied to wound bed Edema Control - Lymphedema / Segmental Compressive Device / Other: Optional: One layer of unna paste to top of compression wrap (to act as an anchor). - PLEASE when applying wrap start from toes and go up to just below knee Elevate, Exercise Daily and Avoid Standing for Long Periods of Time. Elevate legs to the level of the heart and pump ankles as often as possible Elevate leg(s) parallel to the floor when sitting. DO YOUR BEST to sleep in the bed at night. DO NOT sleep in your recliner. Long hours of sitting in a recliner leads to swelling of the legs and/or potential wounds on your  backside. Additional Orders / Instructions: Follow Nutritious Diet and Increase Protein Intake WOUND #1: - Lower Leg Wound Laterality: Left, Medial Cleanser: Wound Cleanser (Home Health) 3 x Per Week/30 Days Discharge Instructions: Wash your hands with soap and water. Remove old dressing, discard into plastic bag and place into trash. Cleanse the wound with Wound Cleanser prior to applying a clean dressing using gauze sponges, not tissues or cotton balls. Do not scrub or use excessive force. Pat dry using gauze sponges, not tissue or cotton balls. Peri-Wound Care: Nystatin Cream USP 30 (g) 3 x Per Week/30 Days Discharge Instructions: Use Nystatin Cream as directed. MIx 1:1 with TCA cream and applied to peri wound Peri-Wound Care: Triamcinolone Acetonide Cream, 0.1%, 15 (g) tube 3 x Per Week/30 Days Discharge Instructions: Apply as directed. mix 1:1 with nystatin cream for peri wound Primary Dressing: Aquacel Extra Hydrofiber Dressing, 4x5 (in/in) 3 x Per Week/30 Days Primary Dressing: keystone compound 3 x Per Week/30 Days Discharge Instructions: apply to wound bed as directed on medication label PLEASE follow direction on pill bottle for proper mixing Secondary Dressing: Zetuvit Absorbent Pad, 4x8 (in/in) 3 x Per Week/30 Days Compression Wrap: 3-LAYER WRAP - Profore Lite LF 3 Multilayer Compression Bandaging System 3 x Per Week/30 Days Discharge Instructions: Apply 3 multi-layer wrap as prescribed. 1. In office sharp debridement 2. Keystone antibiotic and calcium alginate under 3 layer compression 3. Follow-up in 1 week Electronic Signature(s) Signed: 06/14/2022 11:52:25 AM By: Kalman Shan DO Entered By: Kalman Shan on 06/14/2022 11:48:41 Laura Santiago ([id number]) -------------------------------------------------------------------------------- ROS/PFSH Details Patient Name: Laura Santiago. Date of Service: 06/14/2022 10:30 AM Medical Record Number: [id number] Patient  Account Number: [id number] Date of Birth/Sex: [birth year] (76 y.o. F)  Treating RN: Levora Dredge Primary Care Provider: [facility], [place] Other Clinician: [name] Referring Provider: [facility] Treating Provider/Extender: Yaakov Guthrie in Treatment: 43 Information Obtained From Patient Eyes Medical History: Negative for: Cataracts; Glaucoma; Optic Neuritis Ear/Nose/Mouth/Throat Medical History: Negative for: Chronic sinus problems/congestion; Middle ear problems Hematologic/Lymphatic Medical History: Negative for: Anemia; Hemophilia; Human Immunodeficiency Virus; Lymphedema; Sickle Cell Disease Respiratory Medical History: Negative for: Aspiration; Asthma; Chronic Obstructive Pulmonary Disease (COPD); Pneumothorax; Sleep Apnea; Tuberculosis Cardiovascular Medical History: Positive for: Hypertension Negative for: Angina; Arrhythmia; Congestive Heart Failure; Coronary Artery Disease; Deep Vein Thrombosis; Hypotension; Myocardial Infarction; Peripheral Arterial Disease; Peripheral Venous Disease; Phlebitis; Vasculitis Gastrointestinal Medical History: Negative for: Cirrhosis ; Colitis; Crohnos; Hepatitis A; Hepatitis B; Hepatitis C Endocrine Medical History: Negative for: Type I Diabetes; Type II Diabetes Genitourinary Medical History: Negative for: End Stage Renal Disease Immunological Medical History: Negative for: Lupus Erythematosus; Raynaudos; Scleroderma Integumentary (Skin) Medical History: Negative for: History of Burn; History of pressure wounds Musculoskeletal Medical History: Positive for: Osteoarthritis Laura Santiago, Laura Santiago ([id number]) Negative for: Gout; Rheumatoid Arthritis; Osteomyelitis Oncologic Medical History: Positive for: Received Chemotherapy; Received Radiation Past Medical History Notes: breast cancer Immunizations Pneumococcal Vaccine: Received Pneumococcal Vaccination: No Implantable Devices None Family and  Social History Never smoker Electronic Signature(s) Signed: 06/14/2022 11:52:25 AM By: Kalman Shan DO Signed: 06/14/2022 4:13:21 PM By: Levora Dredge Entered By: Kalman Shan on 06/14/2022 11:50:46 Laura Santiago ([id number]) -------------------------------------------------------------------------------- SuperBill Details Patient Name: Laura Santiago. Date of Service: 06/14/2022 Medical Record Number: [id number] Patient Account Number: [id number] Date of Birth/Sex: [birth year] (76 y.o. F) Treating RN: Levora Dredge Primary Care Provider: [facility], [place] Other Clinician: [name] Referring Provider: [facility] Treating Provider/Extender: Yaakov Guthrie in Treatment: 49 Diagnosis Coding ICD-10 Codes Code Description [id number] 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 I87.2 Venous insufficiency (chronic) (peripheral) Z79.01 Long term (current) use of anticoagulants I10 Essential (primary) hypertension 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 Facility Procedures CPT4 Code: [id number] Description: 63846 - DEB SUBQ TISSUE 20 SQ CM/< Modifier: Quantity: 1 CPT4 Code: Description: ICD-10 Diagnosis Description L97.822 Non-pressure chronic ulcer of other part of left lower leg with fat layer Modifier: exposed Quantity: CPT4 Code: [id number] Description: 17793 - DEB SUBQ TISS EA ADDL 20CM Modifier: Quantity: 3 CPT4 Code: Description: ICD-10 Diagnosis Description L97.822 Non-pressure chronic ulcer of other part of left lower leg with fat layer Modifier: exposed Quantity: Physician Procedures CPT4 Code: 9030092 Description: 11042 - WC PHYS SUBQ TISS 20 SQ CM Modifier: Quantity: 1 CPT4 Code: Description: ICD-10 Diagnosis Description L97.822 Non-pressure chronic ulcer of other part of left lower leg with fat  layer Modifier: exposed Quantity: CPT4 Code: [id number] Description: 11045 - WC PHYS SUBQ TISS EA ADDL 20 CM Modifier: Quantity: 3 CPT4 Code: Description: ICD-10 Diagnosis Description L97.822 Non-pressure chronic ulcer of other part of left lower leg with fat layer Modifier: exposed Quantity: Electronic Signature(s) Signed: 06/14/2022 11:52:25 AM By: Kalman Shan DO Entered By: Kalman Shan on 06/14/2022 11:50:12

## 2022-06-15 NOTE — Progress Notes (Signed)
MARGORIE, RENNER ([id number]) Visit Report for 06/14/2022 Arrival Information Details Patient Name: Laura Santiago, AMARA. Date of Service: 06/14/2022 10:30 AM Medical Record Number: [id number] Patient Account Number: [id number] Date of Birth/Sex: [birth year] (76 y.o. F) Treating RN: Levora Dredge Primary Care Hafsa Lohn: [facility], [place] Other Clinician: [name] Referring Shonta Bourque: [facility] Treating Westin Knotts/Extender: Yaakov Guthrie in Treatment: 15 Visit Information History Since Last Visit All ordered tests and consults were completed: No Patient Arrived: Ambulatory Added or deleted any medications: No Arrival Time: 10:39 Any new allergies or adverse reactions: No Transfer Assistance: None Had a fall or experienced change in No Patient Requires Transmission-Based No activities of daily living that may affect Precautions: risk of falls: Patient Has Alerts: Yes Hospitalized since last visit: No Patient Alerts: PT HAS SERVICE Pain Present Now: Yes ANIMAL ABI 07/11/21 R) 1.16 L) 1.27 Electronic Signature(s) Signed: 06/15/2022 4:13:59 PM By: [name] Entered By: [name] on 06/14/2022 10:41:01 Laura Santiago Calkins ([id number]) -------------------------------------------------------------------------------- Clinic Level of Care Assessment Details Patient Name: Laura Santiago Calkins. Date of Service: 06/14/2022 10:30 AM Medical Record Number: [id number] Patient Account Number: [id number] Date of Birth/Sex: 10-08-1946 (76 y.o. F) Treating RN: Levora Dredge Primary Care Denece Shearer: [facility], [place] Other Clinician: [name] Referring Brave Dack: [facility] Treating Vanassa Penniman/Extender: Yaakov Guthrie in Treatment: 2 Clinic Level of Care Assessment Items TOOL 1 Quantity Score _0  - Use when EandM and Procedure is performed on INITIAL visit 0 ASSESSMENTS - Nursing Assessment / Reassessment _1  - General Physical Exam (combine w/  comprehensive assessment (listed just below) when performed on new 0 pt. evals) _2  - 0 Comprehensive Assessment (HX, ROS, Risk Assessments, Wounds Hx, etc.) ASSESSMENTS - Wound and Skin Assessment / Reassessment _3  - Dermatologic / Skin Assessment (not related to wound area) 0 ASSESSMENTS - Ostomy and/or Continence Assessment and Care _4  - Incontinence Assessment and Management 0 _5  - 0 Ostomy Care Assessment and Management (repouching, etc.) PROCESS - Coordination of Care _6  - Simple Patient / Family Education for ongoing care 0 _7  - 0 Complex (extensive) Patient / Family Education for ongoing care _8  - 0 Staff obtains Programmer, systems, Records, Test Results / Process Orders _9  - 0 Staff telephones HHA, Nursing Homes / Clarify orders / etc _10  - 0 Routine Transfer to another Facility (non-emergent condition) _11  - 0 Routine Hospital Admission (non-emergent condition) _12  - 0 New Admissions / Biomedical engineer / Ordering NPWT, Apligraf, etc. _13  - 0 Emergency Hospital Admission (emergent condition) PROCESS - Special Needs _14  - Pediatric / Minor Patient Management 0 _15  - 0 Isolation Patient Management _16  - 0 Hearing / Language / Visual special needs _17  - 0 Assessment of Community assistance (transportation, D/C planning, etc.) _18  - 0 Additional assistance / Altered mentation _19  - 0 Support Surface(s) Assessment (bed, cushion, seat, etc.) INTERVENTIONS - Miscellaneous _20  - External ear exam 0 _21  - 0 Patient Transfer (multiple staff / Civil Service fast streamer / Similar devices) _22  - 0 Simple Staple / Suture removal (25 or less) _23  - 0 Complex Staple / Suture removal (26 or more) _24  - 0 Hypo/Hyperglycemic Management (do not check if billed separately) _25  - 0 Ankle / Brachial Index (ABI) - do not check if billed separately Has the patient been seen at the hospital within the last three years: Yes Total Score: 0 Level Of Care: ____ Laura Santiago Calkins ([id number]) Electronic  Signature(s) Signed: 06/15/2022 4:13:59 PM By: [name] Entered By: [name] on 06/14/2022 11:19:35 Laura Santiago Calkins ([id number]) --------------------------------------------------------------------------------  Compression Therapy Details Patient Name: LOVELLA, HARDIE. Date of Service: 06/14/2022 10:30 AM Medical Record Number: [id number] Patient Account Number: [id number] Date of Birth/Sex: 11-01-1946 (76 y.o. F) Treating RN: Levora Dredge Primary Care Aniketh Huberty: [facility], [place] Other Clinician: [name] Referring Helmut Hennon: [facility] Treating Damyn Weitzel/Extender: Yaakov Guthrie in Treatment: 3 Compression Therapy Performed for Wound Assessment: Wound #1 Left,Medial Lower Leg Performed By: [name], [name], Compression Type: Three Layer Pre Treatment ABI: 1.5 Post Procedure Diagnosis Same as Pre-procedure Electronic Signature(s) Signed: 06/15/2022 4:13:59 PM By: [name] Entered By: [name] on 06/14/2022 11:19:14 Laura Santiago Calkins ([id number]) -------------------------------------------------------------------------------- Encounter Discharge Information Details Patient Name: Laura Santiago Calkins. Date of Service: 06/14/2022 10:30 AM Medical Record Number: [id number] Patient Account Number: [id number] Date of Birth/Sex: 11-26-1946 (76 y.o. F) Treating RN: Levora Dredge Primary Care Edvin Albus: [facility], [place] Other Clinician: [name] Referring Daphyne Miguez: [facility] Treating Vittorio Mohs/Extender: Yaakov Guthrie in Treatment: 80 Encounter Discharge Information Items Post Procedure Vitals Discharge Condition: Stable Temperature (F): 97.7 Ambulatory Status: Walker Pulse (bpm): 71 Discharge Destination: Home Respiratory Rate (breaths/min): 18 Transportation: Private Auto Blood Pressure (mmHg): 143/72 Accompanied By: self Schedule Follow-up Appointment: Yes Clinical Summary of Care: Electronic  Signature(s) Signed: 06/15/2022 4:13:59 PM By: [name] Entered By: [name] on 06/14/2022 11:45:35 Laura Santiago Calkins ([id number]) -------------------------------------------------------------------------------- Lower Extremity Assessment Details Patient Name: Laura Santiago Calkins. Date of Service: 06/14/2022 10:30 AM Medical Record Number: [id number] Patient Account Number: [id number] Date of Birth/Sex: 12-18-1945 (76 y.o. F) Treating RN: Levora Dredge Primary Care Kenya Shiraishi: [facility], [place] Other Clinician: [name] Referring Treniyah Lynn: [facility] Treating Teya Otterson/Extender: Yaakov Guthrie in Treatment: 49 Edema Assessment Assessed: [Left: No] [Right: No] [Left: Edema] [Right: :] Calf Left: Right: Point of Measurement: 30 cm From Medial Instep 30.5 cm Ankle Left: Right: Point of Measurement: 9 cm From Medial Instep 24 cm Vascular Assessment Pulses: Dorsalis Pedis Palpable: [Left:Yes] Electronic Signature(s) Signed: 06/14/2022 4:13:21 PM By: Levora Dredge Signed: 06/15/2022 4:13:59 PM By: [name] Entered By: [name] on 06/14/2022 10:57:26 Laura Santiago Calkins ([id number]) -------------------------------------------------------------------------------- Multi Wound Chart Details Patient Name: Laura Santiago Calkins. Date of Service: 06/14/2022 10:30 AM Medical Record Number: [id number] Patient Account Number: [id number] Date of Birth/Sex: 06/13/1946 (76 y.o. F) Treating RN: Levora Dredge Primary Care Jones Viviani: [facility], [place] Other Clinician: [name] Referring Vaness Jelinski: [facility], [facility] Treating Tekesha Almgren/Extender: Yaakov Guthrie in Treatment: 59 Vital Signs Height(in): 66 Pulse(bpm): 80 Weight(lbs): 153 Blood Pressure(mmHg): 143/72 Body Mass Index(BMI): 24.7 Temperature(F): 97.7 Respiratory Rate(breaths/min): 18 Photos: [N/A:N/A] Wound Location: Left, Medial Lower Leg N/A N/A Wounding Event: Gradually  Appeared N/A N/A Primary Etiology: Venous Leg Ulcer N/A N/A Comorbid History: Hypertension, Osteoarthritis, N/A N/A Received Chemotherapy, Received Radiation Date Acquired: 04/06/2021 N/A N/A Weeks of Treatment: 49 N/A N/A Wound Status: Open N/A N/A Wound Recurrence: No N/A N/A Clustered Wound: Yes N/A N/A Measurements L x W x D (cm) 7.3x10.5x0.1 N/A N/A Area (cm) : 60.201 N/A N/A Volume (cm) : 6.02 N/A N/A % Reduction in Area: 33.30% N/A N/A % Reduction in Volume: 66.70% N/A N/A Classification: Full Thickness Without Exposed N/A N/A Support Structures Exudate Amount: Medium N/A N/A Exudate Type: Serosanguineous N/A N/A Exudate Color: red, brown N/A N/A Wound Margin: Flat and Intact N/A N/A Granulation Amount: Medium (34-66%) N/A N/A Granulation Quality: Red, Pink N/A N/A Necrotic Amount: Medium (34-66%) N/A N/A Exposed Structures: Fat Layer (Subcutaneous Tissue): N/A N/A Yes Fascia: No Tendon: No Muscle: No Joint: No Bone:  No Epithelialization: Small (1-33%) N/A N/A Treatment Notes Electronic Signature(s) Signed: 06/15/2022 4:13:59 PM By: [name] Entered By: [name] on 06/14/2022 10:57:46 ALLAHNA, HUSBAND ([id number]) TWILIA, YAKLIN ([id number]) -------------------------------------------------------------------------------- [place] Details Patient Name: Laura Santiago, GRANDPRE. Date of Service: 06/14/2022 10:30 AM Medical Record Number: [id number] Patient Account Number: [id number] Date of Birth/Sex: 05-18-1946 (76 y.o. F) Treating RN: Levora Dredge Primary Care Calen Posch: [facility], [place] Other Clinician: [name] Referring Nhyla Nappi: [facility] Treating Ash Mcelwain/Extender: Yaakov Guthrie in Treatment: 66 Active Inactive Soft Tissue Infection Nursing Diagnoses: Impaired tissue integrity Potential for infection: soft tissue Goals: Patient's soft tissue infection will resolve Date Initiated:  03/15/2022 Target Resolution Date: 04/27/2022 Goal Status: Active Signs and symptoms of infection will be recognized early to allow for prompt treatment Date Initiated: 03/15/2022 Date Inactivated: 04/26/2022 Target Resolution Date: 04/27/2022 Goal Status: Met Interventions: Assess signs and symptoms of infection every visit Treatment Activities: Culture and sensitivity : 03/15/2022 Notes: Wound/Skin Impairment Nursing Diagnoses: Impaired tissue integrity Goals: Patient/caregiver will verbalize understanding of skin care regimen Date Initiated: 07/06/2021 Date Inactivated: 07/27/2021 Target Resolution Date: 07/06/2021 Goal Status: Met Ulcer/skin breakdown will have a volume reduction of 30% by week 4 Date Initiated: 07/06/2021 Date Inactivated: 10/12/2021 Target Resolution Date: 08/06/2021 Goal Status: Unmet Unmet Reason: cont tx Ulcer/skin breakdown will have a volume reduction of 50% by week 8 Date Initiated: 07/06/2021 Target Resolution Date: 09/06/2021 Goal Status: Active Ulcer/skin breakdown will have a volume reduction of 80% by week 12 Date Initiated: 07/06/2021 Target Resolution Date: 10/06/2021 Goal Status: Active Ulcer/skin breakdown will heal within 14 weeks Date Initiated: 07/06/2021 Target Resolution Date: 11/06/2021 Goal Status: Active Interventions: Assess patient/caregiver ability to obtain necessary supplies Assess patient/caregiver ability to perform ulcer/skin care regimen upon admission and as needed Assess ulceration(s) every visit Treatment Activities: Referred to DME Ladaisha Portillo for dressing supplies : 07/06/2021 Skin care regimen initiated : 07/06/2021 Notes: MERLEEN, PICAZO ([id number]) Electronic Signature(s) Signed: 06/14/2022 4:13:21 PM By: Levora Dredge Signed: 06/15/2022 4:13:59 PM By: [name] Entered By: [name] on 06/14/2022 10:57:34 Laura Santiago Calkins  ([id number]) -------------------------------------------------------------------------------- Pain Assessment Details Patient Name: Laura Santiago Calkins. Date of Service: 06/14/2022 10:30 AM Medical Record Number: [id number] Patient Account Number: [id number] Date of Birth/Sex: [birth year] (76 y.o. F) Treating RN: Levora Dredge Primary Care Emrys Mckamie: [name], [place] Other Clinician: [name] Referring Rudransh Bellanca: [facility] Treating Korissa Horsford/Extender: Yaakov Guthrie in Treatment: 55 Active Problems Location of Pain Severity and Description of Pain Patient Has Paino No Site Locations Duration of the Pain. Constant / Intermittento Constant Rate the pain. Current Pain Level: 4 Character of Pain Describe the Pain: Aching, Stabbing Pain Management and Medication Current Pain Management: Medication: Yes Rest: Yes Electronic Signature(s) Signed: 06/14/2022 4:13:21 PM By: Levora Dredge Signed: 06/15/2022 4:13:59 PM By: [name] Entered By: [name] on 06/14/2022 10:43:58 Laura Santiago Calkins ([id number]) -------------------------------------------------------------------------------- Patient/Caregiver Education Details Patient Name: Laura Santiago Calkins. Date of Service: 06/14/2022 10:30 AM Medical Record Number: [id number] Patient Account Number: [id number] Date of Birth/Gender: 1946/02/01 (76 y.o. F) Treating RN: Levora Dredge Primary Care Physician: [facility], [place] Other Clinician: [name] Referring Physician: [facility] Treating Physician/Extender: Yaakov Guthrie in Treatment: 79 Education Assessment Education Provided To: Patient Education Topics Provided Wound/Skin Impairment: Handouts: Other: continue wound care as directed Methods: Explain/Verbal Responses: State content correctly Electronic Signature(s) Signed: 06/15/2022 4:13:59 PM By: [name] Entered By: [name] on 06/14/2022 11:20:06 Laura Santiago Calkins ([id number]) -------------------------------------------------------------------------------- Wound Assessment  Details Patient Name: JUSTENE, JENSEN. Date of Service: 06/14/2022 10:30 AM Medical Record Number: [id number] Patient Account Number: [id number] Date of Birth/Sex: [birth year] (76 y.o. F) Treating RN: Levora Dredge Primary Care Salima Rumer: [facility], [place] Other Clinician: [name] Referring Jakelyn Squyres: [facility] Treating Eilene Voigt/Extender: Yaakov Guthrie in Treatment: 49 Wound Status Wound Number: 1 Primary Venous Leg Ulcer Etiology: Wound Location: Left, Medial Lower Leg Wound Status: Open Wounding Event: Gradually Appeared Comorbid Hypertension, Osteoarthritis, Received Chemotherapy, Date Acquired: 04/06/2021 History: Received Radiation Weeks Of Treatment: 49 Clustered Wound: Yes Photos Wound Measurements Length: (cm) 7.3 Width: (cm) 10.5 Depth: (cm) 0.1 Area: (cm) 60.201 Volume: (cm) 6.02 % Reduction in Area: 33.3% % Reduction in Volume: 66.7% Epithelialization: Small (1-33%) Wound Description Classification: Full Thickness Without Exposed Support Structures Wound Margin: Flat and Intact Exudate Amount: Medium Exudate Type: Serosanguineous Exudate Color: red, brown Foul Odor After Cleansing: No Slough/Fibrino Yes Wound Bed Granulation Amount: Medium (34-66%) Exposed Structure Granulation Quality: Red, Pink Fascia Exposed: No Necrotic Amount: Medium (34-66%) Fat Layer (Subcutaneous Tissue) Exposed: Yes Necrotic Quality: Adherent Slough Tendon Exposed: No Muscle Exposed: No Joint Exposed: No Bone Exposed: No Treatment Notes Wound #1 (Lower Leg) Wound Laterality: Left, Medial Cleanser Wound Cleanser Discharge Instruction: Wash your hands with soap and water. Remove old dressing, discard into plastic bag and place into trash. Cleanse the wound with Wound Cleanser prior to applying a clean dressing using gauze  sponges, not tissues or cotton balls. Do not scrub or use excessive force. Pat dry using gauze sponges, not tissue or cotton balls. KEAUNDRA, STEHLE ([id number]) Peri-Wound Care Nystatin Cream USP 30 (g) Discharge Instruction: Use Nystatin Cream as directed. MIx 1:1 with TCA cream and applied to peri wound Triamcinolone Acetonide Cream, 0.1%, 15 (g) tube Discharge Instruction: Apply as directed. mix 1:1 with nystatin cream for peri wound Topical Primary Dressing Aquacel Extra Hydrofiber Dressing, 4x5 (in/in) keystone compound Discharge Instruction: apply to wound bed as directed on medication label PLEASE follow direction on pill bottle for proper mixing Secondary Dressing Zetuvit Absorbent Pad, 4x8 (in/in) Secured With Compression Wrap 3-LAYER WRAP - Profore Lite LF 3 Multilayer Compression Bandaging System Discharge Instruction: Apply 3 multi-layer wrap as prescribed. Compression Stockings Add-Ons Electronic Signature(s) Signed: 06/14/2022 4:13:21 PM By: Levora Dredge Signed: 06/15/2022 4:13:59 PM By: [name] Entered By: [name] on 06/14/2022 10:55:51 Laura Santiago Calkins ([id number]) -------------------------------------------------------------------------------- Vitals Details Patient Name: Laura Santiago Calkins. Date of Service: 06/14/2022 10:30 AM Medical Record Number: [id number] Patient Account Number: [id number] Date of Birth/Sex: May 29, 1946 (76 y.o. F) Treating RN: Levora Dredge Primary Care Ridhima Golberg: [facility], [place] Other Clinician: [name] Referring Rei Medlen: [facility], [facility] Treating Dezi Schaner/Extender: Yaakov Guthrie in Treatment: 33 Vital Signs Time Taken: 10:42 Temperature (F): 97.7 Height (in): 66 Pulse (bpm): 71 Weight (lbs): 153 Respiratory Rate (breaths/min): 18 Body Mass Index (BMI): 24.7 Blood Pressure (mmHg): 143/72 Reference Range: 80 - 120 mg / dl Electronic Signature(s) Signed: 06/15/2022 4:13:59 PM By: [name] Entered By: [name] on 06/14/2022 10:43:54

## 2022-06-21 ENCOUNTER — Encounter (HOSPITAL_BASED_OUTPATIENT_CLINIC_OR_DEPARTMENT_OTHER): Payer: Medicare Other | Admitting: Internal Medicine

## 2022-06-21 DIAGNOSIS — L97822 Non-pressure chronic ulcer of other part of left lower leg with fat layer exposed: Secondary | ICD-10-CM | POA: Diagnosis not present

## 2022-06-21 DIAGNOSIS — I87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: Secondary | ICD-10-CM | POA: Diagnosis not present

## 2022-06-21 NOTE — Progress Notes (Signed)
Laura, Santiago ([id number]) Visit Report for 06/21/2022 Chief Complaint Document Details Patient Name: Laura Santiago, Laura Santiago. Date of Service: 06/21/2022 9:00 AM Medical Record Number: [id number] Patient Account Number: [id number] Date of Birth/Sex: 29-Jul-1946 (76 y.o. F) Treating RN: Cornell Barman Primary Care Provider: [facility], [place] Other Clinician: Referring Provider: [facility] Treating Provider/Extender: Yaakov Guthrie in Treatment: 49 Information Obtained from: Patient Chief Complaint Left lower extremity wound Right toe wounds Left upper lateral thigh wounds Electronic Signature(s) Signed: 06/21/2022 9:51:30 AM By: Kalman Shan DO Entered By: Kalman Shan on 06/21/2022 09:44:54 Laura Santiago ([id number]) -------------------------------------------------------------------------------- HPI Details Patient Name: Laura Santiago. Date of Service: 06/21/2022 9:00 AM Medical Record Number: [id number] Patient Account Number: [id number] Date of Birth/Sex: 03/23/1946 (76 y.o. F) Treating RN: Cornell Barman Primary Care Provider: [facility], [place] Other Clinician: Referring Provider: [facility], [place] Treating Provider/Extender: Yaakov Guthrie in Treatment: 50 History of Present Illness HPI Description: Admission 7/27 Ms. Laura Santiago is a 76 year old female with a past medical history of ADHD, metastatic breast cancer, stage IV chronic kidney disease, history of DVT on Xarelto and chronic venous insufficiency that presents to the clinic for a chronic left lower extremity wound. She recently moved to [facility] 4 days ago. She was being followed by wound care center in [name]. She reports a 10-year history of wounds to her left lower extremity that eventually do heal with debridement and compression therapy. She states that the current wound reopened 4 months ago and she is using Vaseline and Coban. She denies signs of  infection. 8/3; patient presents for 1 week follow-up. She reports no issues or complaints today. She states she had vascular studies done in the last week. She denies signs of infection. She brought her little service dog with her today. 8/17; patient presents for follow-up. She has missed her last clinic appointment. She states she took the wrap off and attempted to rewrap her leg. She is having difficulty with transportation. She has her service dog with her today. Overall she feels well and reports improvement in wound healing. She denies signs of infection. She reports owning an old Velcro wrap compression and has this at her living facility 9/14; patient presents for follow-up. Patient states that over the past 2 to 3 weeks she developed toe wounds to her right foot. She attributes this to tight fitting shoes. She subsequently developed cellulitis in the right leg and has been treated by doxycycline by her oncologist. She reports improvement in symptoms however continues to have some redness and swelling to this leg. To the left lower extremity patient has been having her wraps changed with home health twice weekly. She states that the [facility] is not helping control the drainage. Other than that she has no issues or complaints today. She denies signs of infection to the left lower extremity. 9/21; patient presents for follow-up. She reports seeing infectious disease for her cellulitis. She reports no further management. She has home health that changes the wraps twice weekly. She has no issues or complaints today. She denies signs of infection. 10/5; patient presents for follow-up. She has no issues or complaints today. She denies signs of infection. She states that the right great toe has not been dressed by home health. 10/12; patient presents for follow-up. She has no issues or complaints today. She reports improvement in her wound healing. She has been using silver alginate to the  right great toe wound. She denies signs of infection. 10/26;  patient presents for follow-up. Home health did not have sorbact so they continued to use Hydrofera Blue under the wrap. She has been using silver alginate to the great toe wound however she did not have a dressing in place today. She currently denies signs of infection. 11/2; patient presents for follow-up. She has been using sorb act under the compression wrap. She reports using silver alginate to the toe wound again she does not have a dressing in place. She currently denies signs of infection. 11/23; patient presents for follow-up. Unfortunately she has missed her last 2 clinic appointments. She was last seen 3 weeks ago. She did her own compression wrap with Kerlix and Coban yesterday after seeing vein and vascular. She has not been dressing her right great toe wound. She currently denies signs of infection. 11/30; patient presents for 1 week follow-up. She states she changed her dressing last week prior to home health and use sorb act with Dakin's and Hydrofera Blue. Home health has changed the dressing as well and they have been using sorbact. Today she reports increased redness to her right lower extremity. She has a history of cellulitis to this leg. She has been using silver alginate to the right great toe. Unfortunately she had an episode of diarrhea prior to coming in and had feces all over the right leg and to the wrap of her left leg. 12/7; patient presents for 1 week follow-up. She states that home health did not come out to change the dressing and she took it off yesterday. It is unclear if she is dressing the right toe wound. She denies signs of infection. 12/14; patient presents for 1 week follow-up. She has no issues or complaints today. 12/21; patient presents for follow-up. She has no issues or complaints today. She denies signs of infection. 12/28/2021; patient presents for follow-up. She was hospitalized for sepsis  secondary to right lower extremity cellulitis On 12/23. She states she is currently at a SNF. She states that she was started on doxycycline this morning for her right great toe swelling and redness. She is not sure what dressings have been done to her left lower extremity for the past 3 weeks. She says its been mainly gauze with an Ace wrap. 1/25; patient presents for follow-up. She is still residing in a skilled nursing facility. She reports mild pain to the left lower extremity wound bed. She states she is going to see a podiatrist soon. 2/8; patient presents for follow-up. She has moved back to her residential community from her skilled nursing facility. She has no issues or complaints today. She denies signs of systemic infections. 2/15; patient presents for follow-up. He has no issues or complaints today. She denies systemic signs of infection. 2/22; patient presents for follow-up. She has no issues or complaints today. She denies signs of infection. Laura Santiago, Laura Santiago ([id number]) 3/1; patient presents for follow-up. She states that home health came out the day after she was seen in our clinic and yesterday to do the wrap change. She denies signs of infection. She reports excoriated skin on the ankle. 3/8; patient presents for follow-up. She has no issues or complaints today. She denies signs of infection. 3/15; patient presents for follow-up. Home health has been coming out to change the dressings. She reports more tenderness to the wound site. She denies purulent drainage, increased warmth or erythema to the area. 4/5; patient presents for follow-up. She has missed her last 2 clinic appointments. I have not seen her in  3 weeks. She was recently hospitalized for altered mental status. She was involuntarily committed. She was evaluated by psychiatry and deemed to have competency. There was no specific cause of her altered mental status. It was concluded that her physical and mental health were  declining due to her chronic medical conditions. Currently home health has been coming out for dressing changes. Patient has also been doing her own dressing changes. She reports more skin breakdown to the periwound and now has a new wound. She denies fever/chills. She reports continued tenderness to the wound site. 4/12; patient with significant venous insufficiency and a large wound on her left lower leg taking up about 80% of the circumference of her lower leg. Cultures of this grew MRSA and Pseudomonas. She had completed a course of ciprofloxacin now is starting doxycycline. She has been using Dakin's wet-to-dry and a Tubigrip. She has home health twice a week and we change it once. 4/19; patient presents for follow-up. She completed her course of doxycycline. She has been using Dakin's wet-to-dry dressing and Tubigrip. Home health changes the dressing twice weekly. Currently she has no issues or complaints. 4/26; patient presents for follow-up. At last clinic visit orders for home health were Iodosorb under compression therapy. Unfortunately they did not have the dressing and have been using Dakin's and gentamicin under the wrap. Patient currently denies signs of infection. She has no issues or complaints today. 5/3; patient presents for follow-up. Again Iodosorb has not been used under the compression therapy when home health comes out to change the wrap and dressing. They have been using Sorbact. It is unclear why this is happening since we send orders weekly to the agency. She denies signs of infection. Patient has not purchased the [place] antibiotics. We reached out to the company and they said they have been trying to contact her on a regular basis. We gave the patient the number to call to order the medication. 5/10; patient presents for follow-up. She has no issues or complaints today. Again home health has not been using Iodosorb. Mepilex was on the wound bed. No other dressings noted.  She brought in her [facility] antibiotics. She denies signs of infection. 5/17; patient presents for follow-up. Home health has come out twice since she was last seen. Joint well she has been using [facility] antibiotic with Sorbact under the compression wrap. She has no issues or complaints today. She denies signs of infection. 5/24; patient presents for follow-up. We have been using [facility] antibiotics with Sorbact under compression therapy. She is tolerating the treatment well. She is reporting improvement in wound healing. She denies signs of infection. 5/31; patient presents for follow-up. We continue to do [facility] antibiotics with Sorbact under compression therapy. She continues to report improvement in wound healing. Home health comes out and changes the dressing once weekly. 05-17-2022 upon evaluation today patient appears to be doing better in regard to her wound especially compared to the last time I saw her. Fortunately I do think that she is seeing improvements. With that being said I do believe that she may be benefit from sharp debridement today to clear away some of the necrotic debris I discussed that with her as well. She is an amendable to that plan. Otherwise she is very pleased with how the [employer] is doing for her. 6/14; patient presents for follow-up. We have been using [facility] antibiotic with Sorbact and absorbent dressings under 3 layer compression. She has no issues or complaints today. She reports improvement in  wound healing. She denies signs of infection. 6/21; patient presents for follow-up. We are continuing with [facility] antibiotic and Sorbact under 3 layer compression. Patient has no complaints. Continued wound healing is happening. She denies signs of infection. 6/28; patient presents for follow-up. We have been using [facility] antibiotic with Sorbact under 3 layer compression. Usually home health comes out and changes the dressing twice a week. Unfortunately they did  not go out to change the dressing. It is unclear why. Patient did not call them. She currently denies signs of infection. 7/5; patient presents for follow-up. We have been using [facility] antibiotic with calcium alginate under 3 layer compression. She reports improvement in wound healing. She denies signs of infection. Home health has come out to do dressing changes twice this past week. 7/12; patient presents for follow-up. We have been using [facility] antibiotic with calcium alginate under 3 layer compression. Patient states that home health came out once last week to change the dressing. She reports improvement in wound healing. She currently denies signs of infection. Electronic Signature(s) Signed: 06/21/2022 9:51:30 AM By: Kalman Shan DO Entered By: Kalman Shan on 06/21/2022 09:45:30 Laura Santiago ([id number]) -------------------------------------------------------------------------------- Physical Exam Details Patient Name: Laura Santiago, Laura Santiago. Date of Service: 06/21/2022 9:00 AM Medical Record Number: [id number] Patient Account Number: [id number] Date of Birth/Sex: 1946-03-20 (76 y.o. F) Treating RN: Cornell Barman Primary Care Provider: [facility], [place] Other Clinician: Referring Provider: [facility] Treating Provider/Extender: Yaakov Guthrie in Treatment: 97 Constitutional . Cardiovascular . Psychiatric . Notes Left lower extremity: 2 open wounds to the distal aspect of the leg located to the anterior and posterior sides. The open areas have granulation tissue and slough. No signs of surrounding infection. Good edema control. Electronic Signature(s) Signed: 06/21/2022 9:51:30 AM By: Kalman Shan DO Entered By: Kalman Shan on 06/21/2022 09:46:46 Laura Santiago ([id number]) -------------------------------------------------------------------------------- Physician Orders Details Patient Name: Laura Santiago. Date of Service: 06/21/2022  9:00 AM Medical Record Number: [id number] Patient Account Number: [id number] Date of Birth/Sex: [birth year] (76 y.o. F) Treating RN: Cornell Barman Primary Care Provider: [facility], [place] Other Clinician: Referring Provider: [facility] Treating Provider/Extender: Yaakov Guthrie in Treatment: 57 Verbal / Phone Orders: No Diagnosis Coding ICD-10 Coding Code Description [phone] 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 I87.2 Venous insufficiency (chronic) (peripheral) Z79.01 Long term (current) use of anticoagulants I10 Essential (primary) hypertension 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 Date of Service: 06/21/2022 9:00 AM Medical Record Number: [id number] Patient Account Number: [id number] Date of Birth/Sex: 1946/04/22 (76 y.o. F) Treating RN: Cornell Barman Primary Care Provider: [facility], [place] Other Clinician: Referring Provider: [facility] Treating Provider/Extender: Yaakov Guthrie in Treatment: 6 Active Problems ICD-10 Encounter Code Description Active Date MDM Diagnosis L97.822 Non-pressure chronic ulcer of other part of left lower leg with fat layer 11/02/2021 No Yes exposed I87.312 Chronic venous hypertension (idiopathic) with ulcer of left lower 11/02/2021 No Yes extremity I87.2 Venous insufficiency (chronic) (peripheral) 07/06/2021 No Yes Z79.01 Long term (current) use of anticoagulants 07/06/2021 No Yes I10 Essential (primary) hypertension 07/06/2021 No Yes 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 of  breast 07/06/2021 No Yes Inactive Problems ICD-10 Code Description Active Date Inactive Date S81.802A Unspecified  open wound, left lower leg, initial encounter 07/06/2021 07/06/2021 S91.101A Unspecified open wound of right great toe without damage to nail, initial 08/24/2021 08/24/2021 encounter S91.104A Unspecified open wound of right lesser toe(s) without damage to nail, initial 08/24/2021 08/24/2021 encounter Resolved Problems ICD-10 Code Description Active Date Resolved Date S91.104D Unspecified open wound of right lesser toe(s) without damage to nail, 08/31/2021 08/31/2021 subsequent encounter Laura Santiago, Laura Santiago ([id number]) S91.201D Unspecified open wound of right great toe with damage to nail, subsequent 08/31/2021 08/31/2021 encounter Electronic Signature(s) Signed: 06/21/2022 9:51:30 AM By: Kalman Shan DO Entered By: Kalman Shan on 06/21/2022 09:44:50 Laura Santiago ([id number]) -------------------------------------------------------------------------------- Progress Note Details Patient Name: Laura Santiago. Date of Service: 06/21/2022 9:00 AM Medical Record Number: [id number] Patient Account Number: [id number] Date of Birth/Sex: March 12, 1946 (76 y.o. F) Treating RN: Cornell Barman Primary Care Provider: [facility], [place] Other Clinician: Referring Provider: [facility], [place] Treating Provider/Extender: Yaakov Guthrie in Treatment: 75 Subjective Chief Complaint Information obtained from Patient Left lower extremity wound Right toe wounds Left upper lateral thigh wounds History of Present Illness (HPI) Admission 7/27 Ms. Miley Blanchett is a 76 year old female with a past medical history of ADHD, metastatic breast cancer, stage IV chronic kidney disease, history of DVT on Xarelto and chronic venous insufficiency that presents to the clinic for a chronic left lower extremity wound. She recently moved to [facility] 4 days ago. She was being  followed by wound care center in [name]. She reports a 10-year history of wounds to her left lower extremity that eventually do heal with debridement and compression therapy. She states that the current wound reopened 4 months ago and she is using Vaseline and Coban. She denies signs of infection. 8/3; patient presents for 1 week follow-up. She reports no issues or complaints today. She states she had vascular studies done in the last week. She denies signs of infection. She brought her little service dog with her today. 8/17; patient presents for follow-up. She has missed her last clinic appointment. She states she took the wrap off and attempted to rewrap her leg. She is having difficulty with transportation. She has her service dog with her today. Overall she feels well and reports improvement in wound healing. She denies signs of infection. She reports owning an old Velcro wrap compression and has this at her living facility 9/14; patient presents for follow-up. Patient states that over the past 2 to 3 weeks she developed toe wounds to her right foot. She attributes this to tight fitting shoes. She subsequently developed cellulitis in the right leg and has been treated by doxycycline by her oncologist. She reports improvement in symptoms however continues to have some redness and swelling to this leg. To the left lower extremity patient has been having her wraps changed with home health twice weekly. She states that the [facility] is not helping control the drainage. Other than that she has no issues or complaints today. She denies signs of infection to the left lower extremity. 9/21; patient presents for follow-up. She reports seeing infectious disease for her cellulitis. She reports no further management. She has home health that changes the wraps twice weekly. She has no issues or complaints today. She denies signs of infection. 10/5; patient presents for follow-up. She has no issues or  complaints today. She denies signs of infection. She states that the right great toe has not been dressed by home health. 10/12; patient presents for follow-up. She has no issues or complaints today. She  reports improvement in her wound healing. She has been using silver alginate to the right great toe wound. She denies signs of infection. 10/26; patient presents for follow-up. Home health did not have sorbact so they continued to use Hydrofera Blue under the wrap. She has been using silver alginate to the great toe wound however she did not have a dressing in place today. She currently denies signs of infection. 11/2; patient presents for follow-up. She has been using sorb act under the compression wrap. She reports using silver alginate to the toe wound again she does not have a dressing in place. She currently denies signs of infection. 11/23; patient presents for follow-up. Unfortunately she has missed her last 2 clinic appointments. She was last seen 3 weeks ago. She did her own compression wrap with Kerlix and Coban yesterday after seeing vein and vascular. She has not been dressing her right great toe wound. She currently denies signs of infection. 11/30; patient presents for 1 week follow-up. She states she changed her dressing last week prior to home health and use sorb act with Dakin's and Hydrofera Blue. Home health has changed the dressing as well and they have been using sorbact. Today she reports increased redness to her right lower extremity. She has a history of cellulitis to this leg. She has been using silver alginate to the right great toe. Unfortunately she had an episode of diarrhea prior to coming in and had feces all over the right leg and to the wrap of her left leg. 12/7; patient presents for 1 week follow-up. She states that home health did not come out to change the dressing and she took it off yesterday. It is unclear if she is dressing the right toe wound. She denies  signs of infection. 12/14; patient presents for 1 week follow-up. She has no issues or complaints today. 12/21; patient presents for follow-up. She has no issues or complaints today. She denies signs of infection. 12/28/2021; patient presents for follow-up. She was hospitalized for sepsis secondary to right lower extremity cellulitis On 12/23. She states she is currently at a SNF. She states that she was started on doxycycline this morning for her right great toe swelling and redness. She is not sure what dressings have been done to her left lower extremity for the past 3 weeks. She says its been mainly gauze with an Ace wrap. 1/25; patient presents for follow-up. She is still residing in a skilled nursing facility. She reports mild pain to the left lower extremity wound bed. She states she is going to see a podiatrist soon. 2/8; patient presents for follow-up. She has moved back to her residential community from her skilled nursing facility. She has no issues or complaints today. She denies signs of systemic infections. Laura Santiago, Laura Santiago ([id number]) 2/15; patient presents for follow-up. He has no issues or complaints today. She denies systemic signs of infection. 2/22; patient presents for follow-up. She has no issues or complaints today. She denies signs of infection. 3/1; patient presents for follow-up. She states that home health came out the day after she was seen in our clinic and yesterday to do the wrap change. She denies signs of infection. She reports excoriated skin on the ankle. 3/8; patient presents for follow-up. She has no issues or complaints today. She denies signs of infection. 3/15; patient presents for follow-up. Home health has been coming out to change the dressings. She reports more tenderness to the wound site. She denies purulent drainage, increased warmth  or erythema to the area. 4/5; patient presents for follow-up. She has missed her last 2 clinic appointments. I have not  seen her in 3 weeks. She was recently hospitalized for altered mental status. She was involuntarily committed. She was evaluated by psychiatry and deemed to have competency. There was no specific cause of her altered mental status. It was concluded that her physical and mental health were declining due to her chronic medical conditions. Currently home health has been coming out for dressing changes. Patient has also been doing her own dressing changes. She reports more skin breakdown to the periwound and now has a new wound. She denies fever/chills. She reports continued tenderness to the wound site. 4/12; patient with significant venous insufficiency and a large wound on her left lower leg taking up about 80% of the circumference of her lower leg. Cultures of this grew MRSA and Pseudomonas. She had completed a course of ciprofloxacin now is starting doxycycline. She has been using Dakin's wet-to-dry and a Tubigrip. She has home health twice a week and we change it once. 4/19; patient presents for follow-up. She completed her course of doxycycline. She has been using Dakin's wet-to-dry dressing and Tubigrip. Home health changes the dressing twice weekly. Currently she has no issues or complaints. 4/26; patient presents for follow-up. At last clinic visit orders for home health were Iodosorb under compression therapy. Unfortunately they did not have the dressing and have been using Dakin's and gentamicin under the wrap. Patient currently denies signs of infection. She has no issues or complaints today. 5/3; patient presents for follow-up. Again Iodosorb has not been used under the compression therapy when home health comes out to change the wrap and dressing. They have been using Sorbact. It is unclear why this is happening since we send orders weekly to the agency. She denies signs of infection. Patient has not purchased the [place] antibiotics. We reached out to the company and they said they have  been trying to contact her on a regular basis. We gave the patient the number to call to order the medication. 5/10; patient presents for follow-up. She has no issues or complaints today. Again home health has not been using Iodosorb. Mepilex was on the wound bed. No other dressings noted. She brought in her [facility] antibiotics. She denies signs of infection. 5/17; patient presents for follow-up. Home health has come out twice since she was last seen. Joint well she has been using [facility] antibiotic with Sorbact under the compression wrap. She has no issues or complaints today. She denies signs of infection. 5/24; patient presents for follow-up. We have been using [facility] antibiotics with Sorbact under compression therapy. She is tolerating the treatment well. She is reporting improvement in wound healing. She denies signs of infection. 5/31; patient presents for follow-up. We continue to do [facility] antibiotics with Sorbact under compression therapy. She continues to report improvement in wound healing. Home health comes out and changes the dressing once weekly. 05-17-2022 upon evaluation today patient appears to be doing better in regard to her wound especially compared to the last time I saw her. Fortunately I do think that she is seeing improvements. With that being said I do believe that she may be benefit from sharp debridement today to clear away some of the necrotic debris I discussed that with her as well. She is an amendable to that plan. Otherwise she is very pleased with how the [employer] is doing for her. 6/14; patient presents for follow-up. We have  been using [facility] antibiotic with Sorbact and absorbent dressings under 3 layer compression. She has no issues or complaints today. She reports improvement in wound healing. She denies signs of infection. 6/21; patient presents for follow-up. We are continuing with [facility] antibiotic and Sorbact under 3 layer compression. Patient has  no complaints. Continued wound healing is happening. She denies signs of infection. 6/28; patient presents for follow-up. We have been using [facility] antibiotic with Sorbact under 3 layer compression. Usually home health comes out and changes the dressing twice a week. Unfortunately they did not go out to change the dressing. It is unclear why. Patient did not call them. She currently denies signs of infection. 7/5; patient presents for follow-up. We have been using [facility] antibiotic with calcium alginate under 3 layer compression. She reports improvement in wound healing. She denies signs of infection. Home health has come out to do dressing changes twice this past week. 7/12; patient presents for follow-up. We have been using [facility] antibiotic with calcium alginate under 3 layer compression. Patient states that home health came out once last week to change the dressing. She reports improvement in wound healing. She currently denies signs of infection. Objective Laura Santiago, Laura Santiago ([id number]) Constitutional Vitals Time Taken: 9:10 AM, Height: 66 in, Weight: 153 lbs, BMI: 24.7, Temperature: 98.1 F, Pulse: 72 bpm, Respiratory Rate: 18 breaths/min, Blood Pressure: 134/70 mmHg. General Notes: Left lower extremity: 2 open wounds to the distal aspect of the leg located to the anterior and posterior sides. The open areas have granulation tissue and slough. No signs of surrounding infection. Good edema control. Integumentary (Hair, Skin) Wound #1 status is Open. Original cause of wound was Gradually Appeared. The date acquired was: 04/06/2021. The wound has been in treatment 50 weeks. The wound is located on the Left,Medial Lower Leg. The wound measures 6cm length x 10cm width x 0.1cm depth; 47.124cm^2 area and 4.712cm^3 volume. There is Fat Layer (Subcutaneous Tissue) exposed. There is no tunneling or undermining noted. There is a medium amount of serosanguineous drainage noted. The wound margin  is flat and intact. There is medium (34-66%) red, pink granulation within the wound bed. There is a medium (34-66%) amount of necrotic tissue within the wound bed including Adherent Slough. 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 I debrided nonviable tissue. I recommended continuing the course with [facility] antibiotic and calcium alginate under 3 layer compression. Follow-up in 1 week. Plan 1. In office sharp debridement 2. [facility] antibiotic with calcium alginate under 3 layer compression 3. Follow-up in 1 week Electronic Signature(s) Signed: 06/21/2022 9:51:30 AM By: Kalman Shan DO Entered By: Kalman Shan on 06/21/2022 09:48:25 Laura Santiago ([id number]) -------------------------------------------------------------------------------- SuperBill Details Patient Name: Laura Santiago. Date of Service: 06/21/2022 Medical Record Number: [id number] Patient Account Number: [id number] Date of Birth/Sex: [birth year] (76 y.o. F) Treating RN: Cornell Barman Primary Care Provider: [facility], [place] Other Clinician: Referring Provider: [facility] Treating Provider/Extender: Yaakov Guthrie in Treatment: 50 Diagnosis Coding ICD-10 Codes Code Description 9898223092 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 I87.2 Venous insufficiency (chronic) (peripheral) Z79.01 Long  term (current) use of anticoagulants I10 Essential (primary) hypertension C79.81  Secondary malignant neoplasm of breast Facility Procedures CPT4 Code: 56153794 Description: 32761 - DEB SUBQ TISSUE 20 SQ CM/< Modifier: Quantity: 1 CPT4 Code: Description: ICD-10 Diagnosis Description L97.822 Non-pressure chronic ulcer of other part of left lower leg with fat layer I87.312 Chronic venous hypertension (idiopathic) with ulcer of left lower extremi Modifier: exposed ty Quantity: CPT4 Code: 47092957 Description: 47340 - DEB SUBQ TISS EA ADDL 20CM Modifier: Quantity: 1 CPT4 Code: Description: ICD-10 Diagnosis Description L97.822 Non-pressure chronic ulcer of other part of left lower leg with fat layer I87.312 Chronic venous hypertension (idiopathic) with ulcer of left lower extremi Modifier: exposed ty Quantity: Physician Procedures CPT4 Code: 3709643 Description: 83818 - WC PHYS SUBQ TISS 20 SQ CM Modifier: Quantity: 1 CPT4 Code: Description: ICD-10 Diagnosis Description L97.822 Non-pressure chronic ulcer of other part of left lower leg with fat layer I87.312 Chronic venous hypertension (idiopathic) with ulcer of left lower extremit Modifier: exposed y Quantity: CPT4 Code: 4037543 Description: 11045 - WC PHYS SUBQ TISS EA ADDL 20 CM Modifier: Quantity: 1 CPT4 Code: Description: ICD-10 Diagnosis Description L97.822 Non-pressure chronic ulcer of other part of left lower leg with fat layer I87.312 Chronic venous hypertension (idiopathic) with ulcer of left lower extremit Modifier: exposed y Quantity: Electronic Signature(s) Signed: 06/21/2022 9:51:30 AM By: Kalman Shan DO Entered By: Kalman Shan on 06/21/2022 09:51:02

## 2022-06-22 NOTE — Progress Notes (Signed)
Laura Santiago, Laura Santiago ([id number]) Visit Report for 06/21/2022 Arrival Information Details Patient Name: Laura Santiago, Laura Santiago. Date of Service: 06/21/2022 9:00 AM Medical Record Number: [id number] Patient Account Number: [id number] Date of Birth/Sex: [birth year] (76 y.o. F) Treating RN: Cornell Barman Primary Care Sherwin Hollingshed: [facility], [place] Other Clinician: Referring Chaka Jefferys: [facility] Treating Neita Landrigan/Extender: Yaakov Guthrie in Treatment: 50 Visit Information History Since Last Visit Added or deleted any medications: No Patient Arrived: [place] Has Dressing in Place as Prescribed: Yes Arrival Time: 09:10 Has Compression in Place as Prescribed: Yes Transfer Assistance: None Pain Present Now: No Patient Identification Verified: Yes Secondary Verification Process Completed: Yes Patient Requires Transmission-Based No Precautions: Patient Has Alerts: Yes Patient Alerts: PT HAS SERVICE ANIMAL ABI 07/11/21 R) 1.16 L) 1.27 Electronic Signature(s) Signed: 06/22/2022 8:02:47 AM By: Gretta Cool, BSN, RN, CWS, [name] RN, BSN Entered By: Gretta Cool, BSN, RN, CWS, [name] on 06/21/2022 09:10:57 [name] ([id number]) -------------------------------------------------------------------------------- Lower Extremity Assessment Details Patient Name: Laura Santiago, Laura Santiago. Date of Service: 06/21/2022 9:00 AM Medical Record Number: [id number] Patient Account Number: [id number] Date of Birth/Sex: [birth year] (76 y.o. F) Treating RN: Cornell Barman Primary Care Dorismar Chay: [facility] [place] Other Clinician: Referring Randie Bloodgood: [facility] Treating Rylyn Zawistowski/Extender: Yaakov Guthrie in Treatment: 50 Edema Assessment Assessed: [Left: No] [Right: No] [Left: Edema] [Right: :] Calf Left: Right: Point of Measurement: 30 cm From Medial Instep 31.2 cm Ankle Left: Right: Point of Measurement: 9 cm From Medial Instep 27 cm Vascular Assessment Pulses: Dorsalis Pedis Palpable:  [Left:Yes] Electronic Signature(s) Signed: 06/22/2022 8:02:47 AM By: Gretta Cool, BSN, RN, CWS, [name] RN, BSN Entered By: Gretta Cool, BSN, RN, CWS, [name] on 06/21/2022 09:15:32 [name] ([id number]) -------------------------------------------------------------------------------- Pain Assessment Details Patient Name: Laura Santiago, Laura Santiago. Date of Service: 06/21/2022 9:00 AM Medical Record Number: [id number] Patient Account Number: [id number] Date of Birth/Sex: [birth year] (76 y.o. F) Treating RN: Cornell Barman Primary Care Dedra Matsuo: [facility], [place] Other Clinician: Referring Diamonte Stavely: [facility] Treating Jamarria Real/Extender: Yaakov Guthrie in Treatment: 50 Active Problems Location of Pain Severity and Description of Pain Patient Has Paino No Site Locations Pain Management and Medication Current Pain Management: Electronic Signature(s) Signed: 06/22/2022 8:02:47 AM By: Gretta Cool, BSN, RN, CWS, [name] RN, BSN Entered By: Gretta Cool, BSN, RN, CWS, [name] on 06/21/2022 09:11:26 [name] ([id number]) -------------------------------------------------------------------------------- Wound Assessment Details Patient Name: Laura Santiago, Laura Santiago. Date of Service: 06/21/2022 9:00 AM Medical Record Number: [id number] Patient Account Number: [id number] Date of Birth/Sex: 11/14/1946 (76 y.o. F) Treating RN: Cornell Barman Primary Care Timmie Calix: [facility], [place] Other Clinician: Referring Tinia Oravec: [facility] Treating Yavier Snider/Extender: Yaakov Guthrie in Treatment: 50 Wound Status Wound Number: 1 Primary Venous Leg Ulcer Etiology: Wound Location: Left, Medial Lower Leg Wound Status: Open Wounding Event: Gradually Appeared Comorbid Hypertension, Osteoarthritis, Received Chemotherapy, Date Acquired: 04/06/2021 History: Received Radiation Weeks Of Treatment: 50 Clustered Wound: Yes Photos Wound Measurements Length: (cm) 6 Width: (cm) 10 Depth: (cm) 0.1 Area: (cm)  47.124 Volume: (cm) 4.712 % Reduction in Area: 47.8% % Reduction in Volume: 73.9% Epithelialization: Small (1-33%) Tunneling: No Undermining: No Wound Description Classification: Full Thickness Without Exposed Support Structu Wound Margin: Flat and Intact Exudate Amount: Medium Exudate Type: Serosanguineous Exudate Color: red, brown res Foul Odor After Cleansing: No Slough/Fibrino Yes Wound Bed Granulation Amount: Medium (34-66%) Exposed Structure Granulation Quality: Red, Pink Fascia Exposed: No Necrotic Amount: Medium (34-66%) Fat Layer (Subcutaneous Tissue) Exposed: Yes Necrotic Quality: Adherent Slough Tendon Exposed: No Muscle Exposed: No Joint Exposed: No Bone Exposed: No Electronic  Signature(s) Signed: 06/22/2022 8:02:47 AM By: Gretta Cool, BSN, RN, CWS, [name] RN, BSN Entered By: Gretta Cool, BSN, RN, CWS, [name] on 06/21/2022 09:14:17 Laura Santiago, Laura Santiago ([id number]) -------------------------------------------------------------------------------- [place] Details Patient Name: Laura Santiago, Laura Santiago. Date of Service: 06/21/2022 9:00 AM Medical Record Number: [id number] Patient Account Number: [id number] Date of Birth/Sex: 1946-12-03 (76 y.o. F) Treating RN: Cornell Barman Primary Care Quashon Jesus: [facility], [place] Other Clinician: Referring Tenika Keeran: [facility] Treating Ellah Otte/Extender: Yaakov Guthrie in Treatment: 50 Vital Signs Time Taken: 09:10 Temperature (F): 98.1 Height (in): 66 Pulse (bpm): 72 Weight (lbs): 153 Respiratory Rate (breaths/min): 18 Body Mass Index (BMI): 24.7 Blood Pressure (mmHg): 134/70 Reference Range: 80 - 120 mg / dl Electronic Signature(s) Signed: 06/22/2022 8:02:47 AM By: Gretta Cool, BSN, RN, CWS, [name] RN, BSN Entered By: Gretta Cool, BSN, RN, CWS, [name] on 06/21/2022 09:11:16

## 2022-06-27 ENCOUNTER — Inpatient Hospital Stay: Payer: Medicare Other | Attending: Oncology

## 2022-06-27 VITALS — BP 125/61 | HR 73 | Temp 97.9°F | Resp 19 | Wt 147.9 lb

## 2022-06-27 DIAGNOSIS — C787 Secondary malignant neoplasm of liver and intrahepatic bile duct: Secondary | ICD-10-CM | POA: Insufficient documentation

## 2022-06-27 DIAGNOSIS — Z5112 Encounter for antineoplastic immunotherapy: Secondary | ICD-10-CM | POA: Insufficient documentation

## 2022-06-27 DIAGNOSIS — C779 Secondary and unspecified malignant neoplasm of lymph node, unspecified: Secondary | ICD-10-CM | POA: Insufficient documentation

## 2022-06-27 DIAGNOSIS — C7951 Secondary malignant neoplasm of bone: Secondary | ICD-10-CM | POA: Diagnosis not present

## 2022-06-27 DIAGNOSIS — Z170 Estrogen receptor positive status [ER+]: Secondary | ICD-10-CM | POA: Diagnosis not present

## 2022-06-27 DIAGNOSIS — Z5111 Encounter for antineoplastic chemotherapy: Secondary | ICD-10-CM | POA: Diagnosis present

## 2022-06-27 DIAGNOSIS — C50911 Malignant neoplasm of unspecified site of right female breast: Secondary | ICD-10-CM | POA: Diagnosis not present

## 2022-06-27 DIAGNOSIS — C50919 Malignant neoplasm of unspecified site of unspecified female breast: Secondary | ICD-10-CM

## 2022-06-27 MED ORDER — ACETAMINOPHEN 325 MG PO TABS
650.0000 mg | ORAL_TABLET | Freq: Once | ORAL | Status: AC
  Administered 2022-06-27: 650 mg via ORAL
  Filled 2022-06-27: qty 2

## 2022-06-27 MED ORDER — TRASTUZUMAB-DKST CHEMO 150 MG IV SOLR
6.0000 mg/kg | Freq: Once | INTRAVENOUS | Status: AC
  Administered 2022-06-27: 420 mg via INTRAVENOUS
  Filled 2022-06-27: qty 20

## 2022-06-27 MED ORDER — HEPARIN SOD (PORK) LOCK FLUSH 100 UNIT/ML IV SOLN
500.0000 [IU] | Freq: Once | INTRAVENOUS | Status: AC | PRN
  Filled 2022-06-27: qty 5

## 2022-06-27 MED ORDER — DIPHENHYDRAMINE HCL 25 MG PO CAPS
50.0000 mg | ORAL_CAPSULE | Freq: Once | ORAL | Status: AC
  Administered 2022-06-27: 50 mg via ORAL
  Filled 2022-06-27: qty 2

## 2022-06-27 MED ORDER — HEPARIN SOD (PORK) LOCK FLUSH 100 UNIT/ML IV SOLN
INTRAVENOUS | Status: AC
  Administered 2022-06-27: 500 [IU]
  Filled 2022-06-27: qty 5

## 2022-06-27 MED ORDER — SODIUM CHLORIDE 0.9 % IV SOLN
420.0000 mg | Freq: Once | INTRAVENOUS | Status: AC
  Administered 2022-06-27: 420 mg via INTRAVENOUS
  Filled 2022-06-27: qty 14

## 2022-06-27 MED ORDER — SODIUM CHLORIDE 0.9 % IV SOLN
Freq: Once | INTRAVENOUS | Status: AC
  Filled 2022-06-27: qty 250

## 2022-06-27 NOTE — Patient Instructions (Signed)
[facility] [place]  Discharge Instructions: Thank you for choosing [facility] to provide your oncology and hematology care.  If you have a lab appointment with the [facility], please go directly to the [facility] and check in at the registration area.  Wear comfortable clothing and clothing appropriate for easy access to any Portacath or PICC line.   We strive to give you quality time with your provider. You may need to reschedule your appointment if you arrive late (15 or more minutes).  Arriving late affects you and other patients whose appointments are after yours.  Also, if you miss three or more appointments without notifying the office, you may be dismissed from the clinic at the provider's discretion.      For prescription refill requests, have your pharmacy contact our office and allow 72 hours for refills to be completed.    Today you received the following chemotherapy and/or immunotherapy agents Ogivri & Perjeta    To help prevent nausea and vomiting after your treatment, we encourage you to take your nausea medication as directed.  BELOW ARE SYMPTOMS THAT SHOULD BE REPORTED IMMEDIATELY: *FEVER GREATER THAN 100.4 F (38 C) OR HIGHER *CHILLS OR SWEATING *NAUSEA AND VOMITING THAT IS NOT CONTROLLED WITH YOUR NAUSEA MEDICATION *UNUSUAL SHORTNESS OF BREATH *UNUSUAL BRUISING OR BLEEDING *URINARY PROBLEMS (pain or burning when urinating, or frequent urination) *BOWEL PROBLEMS (unusual diarrhea, constipation, pain near the anus) TENDERNESS IN MOUTH AND THROAT WITH OR WITHOUT PRESENCE OF ULCERS (sore throat, sores in mouth, or a toothache) UNUSUAL RASH, SWELLING OR PAIN  UNUSUAL VAGINAL DISCHARGE OR ITCHING   Items with * indicate a potential emergency and should be followed up as soon as possible or go to the Emergency Department if any problems should occur.  Please show the CHEMOTHERAPY ALERT CARD or IMMUNOTHERAPY ALERT CARD at check-in  to the Emergency Department and triage nurse.  Should you have questions after your visit or need to cancel or reschedule your appointment, please contact [facility] [place] [facility] [place]  [phone] and follow the prompts.  Office hours are 8:00 a.m. to 4:30 p.m. Monday - Friday. Please note that voicemails left after 4:00 p.m. may not be returned until the following business day.  We are closed weekends and major holidays. You have access to a nurse at all times for urgent questions. Please call the main number to the clinic [phone] and follow the prompts.  For any non-urgent questions, you may also contact your provider using MyChart. We now offer e-Visits for anyone 25 and older to request care online for non-urgent symptoms. For details visit [email or web address].   Also download the MyChart app! Go to the app store, search "MyChart", open the app, select [facility], and log in with your MyChart username and password.  Masks are optional in the cancer centers. If you would like for your care team to wear a mask while they are taking care of you, please let them know. For doctor visits, patients may have with them one support person who is at least 78 years old. At this time, visitors are not allowed in the infusion area.

## 2022-06-28 ENCOUNTER — Encounter (HOSPITAL_BASED_OUTPATIENT_CLINIC_OR_DEPARTMENT_OTHER): Payer: Medicare Other | Admitting: Internal Medicine

## 2022-06-28 DIAGNOSIS — I87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: Secondary | ICD-10-CM | POA: Diagnosis not present

## 2022-06-28 DIAGNOSIS — L97822 Non-pressure chronic ulcer of other part of left lower leg with fat layer exposed: Secondary | ICD-10-CM | POA: Diagnosis not present

## 2022-06-28 NOTE — Progress Notes (Signed)
Laura Santiago, Laura Santiago ([id number]) Visit Report for 06/28/2022 Arrival Information Details Patient Name: Laura Santiago, Laura Santiago. Date of Service: 06/28/2022 10:30 AM Medical Record Number: [id number] Patient Account Number: [id number] Date of Birth/Sex: 16-Oct-1946 (76 y.o. F) Treating RN: Levora Dredge Primary Care Kalysta Kneisley: [facility], [place] Other Clinician: [name] Referring Marena Witts: [facility] Treating Neev Mcmains/Extender: Yaakov Guthrie in Treatment: 16 Visit Information History Since Last Visit All ordered tests and consults were completed: No Patient Arrived: Laura Santiago Added or deleted any medications: No Arrival Time: 11:02 Any new allergies or adverse reactions: No Transfer Assistance: None Had a fall or experienced change in No Patient Requires Transmission-Based No activities of daily living that may affect Precautions: risk of falls: Patient Has Alerts: Yes Hospitalized since last visit: No Patient Alerts: PT HAS SERVICE Pain Present Now: Yes ANIMAL ABI 07/11/21 R) 1.16 L) 1.27 Electronic Signature(s) Signed: 06/28/2022 4:21:04 PM By: [name] Entered By: [name] on 06/28/2022 11:03:13 Laura Santiago ([id number]) -------------------------------------------------------------------------------- Clinic Level of Care Assessment Details Patient Name: Laura Santiago. Date of Service: 06/28/2022 10:30 AM Medical Record Number: [id number] Patient Account Number: [id number] Date of Birth/Sex: August 12, 1946 (76 y.o. F) Treating RN: Levora Dredge Primary Care Shemika Robbs: [facility], [place] Other Clinician: [name] Referring Jermanie Minshall: [facility] Treating Nekia Maxham/Extender: Yaakov Guthrie in Treatment: 41 Clinic Level of Care Assessment Items TOOL 1 Quantity Score _0  - Use when EandM and Procedure is performed on INITIAL visit 0 ASSESSMENTS - Nursing Assessment / Reassessment _1  - General Physical Exam (combine w/  comprehensive assessment (listed just below) when performed on new 0 pt. evals) _2  - 0 Comprehensive Assessment (HX, ROS, Risk Assessments, Wounds Hx, etc.) ASSESSMENTS - Wound and Skin Assessment / Reassessment _3  - Dermatologic / Skin Assessment (not related to wound area) 0 ASSESSMENTS - Ostomy and/or Continence Assessment and Care _4  - Incontinence Assessment and Management 0 _5  - 0 Ostomy Care Assessment and Management (repouching, etc.) PROCESS - Coordination of Care _6  - Simple Patient / Family Education for ongoing care 0 _7  - 0 Complex (extensive) Patient / Family Education for ongoing care _8  - 0 Staff obtains Programmer, systems, Records, Test Results / Process Orders _9  - 0 Staff telephones HHA, Nursing Homes / Clarify orders / etc _10  - 0 Routine Transfer to another Facility (non-emergent condition) _11  - 0 Routine Hospital Admission (non-emergent condition) _12  - 0 New Admissions / Biomedical engineer / Ordering NPWT, Apligraf, etc. _13  - 0 Emergency Hospital Admission (emergent condition) PROCESS - Special Needs _14  - Pediatric / Minor Patient Management 0 _15  - 0 Isolation Patient Management _16  - 0 Hearing / Language / Visual special needs _17  - 0 Assessment of Community assistance (transportation, D/C planning, etc.) _18  - 0 Additional assistance / Altered mentation _19  - 0 Support Surface(s) Assessment (bed, cushion, seat, etc.) INTERVENTIONS - Miscellaneous _20  - External ear exam 0 _21  - 0 Patient Transfer (multiple staff / Civil Service fast streamer / Similar devices) _22  - 0 Simple Staple / Suture removal (25 or less) _23  - 0 Complex Staple / Suture removal (26 or more) _24  - 0 Hypo/Hyperglycemic Management (do not check if billed separately) _25  - 0 Ankle / Brachial Index (ABI) - do not check if billed separately Has the patient been seen at the hospital within the last three years: Yes Total Score: 0 Level Of Care: ____ Laura Santiago ([id number]) Electronic  Signature(s) Signed: 06/28/2022 4:21:04 PM By: [name] Entered By: [name] on 06/28/2022 11:51:07 Laura Santiago ([id number]) --------------------------------------------------------------------------------  Compression Therapy Details Patient Name: Laura Santiago, Laura Santiago. Date of Service: 06/28/2022 10:30 AM Medical Record Number: [id number] Patient Account Number: [id number] Date of Birth/Sex: [birth year] (76 y.o. F) Treating RN: Levora Dredge Primary Care Constantina Laseter: [facility], [place] Other Clinician: [name] Referring Jullian Clayson: [facility] Treating Sena Hoopingarner/Extender: Yaakov Guthrie in Treatment: 68 Compression Therapy Performed for Wound Assessment: Wound #1 Left,Medial Lower Leg Performed By: Clinician Levora Dredge, RN Compression Type: Three Layer Pre Treatment ABI: 1.5 Post Procedure Diagnosis Same as Pre-procedure Electronic Signature(s) Signed: 06/28/2022 4:21:04 PM By: [name] Entered By: [name] on 06/28/2022 11:50:50 Laura Santiago ([id number]) -------------------------------------------------------------------------------- Encounter Discharge Information Details Patient Name: Laura Santiago. Date of Service: 06/28/2022 10:30 AM Medical Record Number: [id number] Patient Account Number: [id number] Date of Birth/Sex: 1946-05-28 (76 y.o. F) Treating RN: Levora Dredge Primary Care Brayan Votaw: [facility], [place] Other Clinician: [name] Referring Marylene Masek: [facility] Treating Kamariya Blevens/Extender: Yaakov Guthrie in Treatment: 34 Encounter Discharge Information Items Discharge Condition: Stable Ambulatory Status: Walker Discharge Destination: Home Transportation: Other Accompanied By: self Schedule Follow-up Appointment: Yes Clinical Summary of Care: Electronic Signature(s) Signed: 06/28/2022 4:21:04 PM By: [name] Entered By: [name] on 06/28/2022 12:12:02 Laura Santiago ([id number]) -------------------------------------------------------------------------------- Lower Extremity Assessment Details Patient Name: Laura Santiago. Date of Service: 06/28/2022 10:30 AM Medical Record Number: [id number] Patient Account Number: [id number] Date of Birth/Sex: 05-Sep-1946 (76 y.o. F) Treating RN: Levora Dredge Primary Care Davanta Meuser: [facility], [place] Other Clinician: [name] Referring Montserrat Shek: [facility] Treating Fumiko Cham/Extender: Yaakov Guthrie in Treatment: 51 Edema Assessment Assessed: [Left: Yes] [Right: No] Edema: [Left: Ye] [Right: s] Calf Left: Right: Point of Measurement: 30 cm From Medial Instep 35.5 cm Ankle Left: Right: Point of Measurement: 9 cm From Medial Instep 24 cm Vascular Assessment Pulses: Dorsalis Pedis Palpable: [Left:Yes] Posterior Tibial Palpable: [Left:Yes] Electronic Signature(s) Signed: 06/28/2022 3:19:57 PM By: Levora Dredge Signed: 06/28/2022 4:21:04 PM By: [name] Entered By: [name] on 06/28/2022 11:31:13 Laura Santiago ([id number]) -------------------------------------------------------------------------------- Multi Wound Chart Details Patient Name: Laura Santiago. Date of Service: 06/28/2022 10:30 AM Medical Record Number: [id number] Patient Account Number: [id number] Date of Birth/Sex: Oct 17, 1946 (76 y.o. F) Treating RN: Levora Dredge Primary Care Gaje Tennyson: [facility], [place] Other Clinician: [name] Referring Ranie Chinchilla: [facility] Treating Maame Dack/Extender: Yaakov Guthrie in Treatment: 70 Vital Signs Height(in): 66 Pulse(bpm): 11 Weight(lbs): 153 Blood Pressure(mmHg): 129/71 Body Mass Index(BMI): 24.7 Temperature(F): 98.0 Respiratory Rate(breaths/min): 18 Photos: [N/A:N/A] Wound Location: Left, Medial Lower Leg N/A N/A Wounding Event: Gradually Appeared N/A N/A Primary Etiology: Venous Leg Ulcer N/A N/A Comorbid History:  Hypertension, Osteoarthritis, N/A N/A Received Chemotherapy, Received Radiation Date Acquired: 04/06/2021 N/A N/A Weeks of Treatment: 51 N/A N/A Wound Status: Open N/A N/A Wound Recurrence: No N/A N/A Clustered Wound: Yes N/A N/A Measurements L x W x D (cm) 6.1x1x0.1 N/A N/A Area (cm) : 4.791 N/A N/A Volume (cm) : 0.479 N/A N/A % Reduction in Area: 94.70% N/A N/A % Reduction in Volume: 97.30% N/A N/A Classification: Full Thickness Without Exposed N/A N/A Support Structures Exudate Amount: Medium N/A N/A Exudate Type: Serosanguineous N/A N/A Exudate Color: red, brown N/A N/A Wound Margin: Flat and Intact N/A N/A Granulation Amount: Medium (34-66%) N/A N/A Granulation Quality: Red, Pink N/A N/A Necrotic Amount: Medium (34-66%) N/A N/A Exposed Structures: Fat Layer (Subcutaneous Tissue): N/A N/A Yes Fascia: No Tendon: No Muscle: No Joint: No Bone: No Epithelialization: Small (1-33%) N/A N/A Treatment Notes Electronic Signature(s) Signed: 06/28/2022  4:21:04 PM By: [name] Entered By: [name] on 06/28/2022 11:31:25 Laura Santiago ([id number] Santiago ([id number]) -------------------------------------------------------------------------------- [place] Details Patient Name: Laura Santiago, Laura Santiago. Date of Service: 06/28/2022 10:30 AM Medical Record Number: [id number] Patient Account Number: [id number] Date of Birth/Sex: 1946-09-08 (76 y.o. F) Treating RN: Levora Dredge Primary Care Carmello Cabiness: [facility], [place] Other Clinician: [name] Referring Giancarlos Berendt: [facility] Treating Pauleen Goleman/Extender: Yaakov Guthrie in Treatment: 67 Active Inactive Soft Tissue Infection Nursing Diagnoses: Impaired tissue integrity Potential for infection: soft tissue Goals: Patient's soft tissue infection will resolve Date Initiated: 03/15/2022 Target Resolution Date: 04/27/2022 Goal Status: Active Signs and symptoms of  infection will be recognized early to allow for prompt treatment Date Initiated: 03/15/2022 Date Inactivated: 04/26/2022 Target Resolution Date: 04/27/2022 Goal Status: Met Interventions: Assess signs and symptoms of infection every visit Treatment Activities: Culture and sensitivity : 03/15/2022 Notes: Wound/Skin Impairment Nursing Diagnoses: Impaired tissue integrity Goals: Patient/caregiver will verbalize understanding of skin care regimen Date Initiated: 07/06/2021 Date Inactivated: 07/27/2021 Target Resolution Date: 07/06/2021 Goal Status: Met Ulcer/skin breakdown will have a volume reduction of 30% by week 4 Date Initiated: 07/06/2021 Date Inactivated: 10/12/2021 Target Resolution Date: 08/06/2021 Goal Status: Unmet Unmet Reason: cont tx Ulcer/skin breakdown will have a volume reduction of 50% by week 8 Date Initiated: 07/06/2021 Target Resolution Date: 09/06/2021 Goal Status: Active Ulcer/skin breakdown will have a volume reduction of 80% by week 12 Date Initiated: 07/06/2021 Target Resolution Date: 10/06/2021 Goal Status: Active Ulcer/skin breakdown will heal within 14 weeks Date Initiated: 07/06/2021 Target Resolution Date: 11/06/2021 Goal Status: Active Interventions: Assess patient/caregiver ability to obtain necessary supplies Assess patient/caregiver ability to perform ulcer/skin care regimen upon admission and as needed Assess ulceration(s) every visit Treatment Activities: Referred to DME Autym Siess for dressing supplies : 07/06/2021 Skin care regimen initiated : 07/06/2021 Notes: Laura Santiago, Laura Santiago ([id number]) Electronic Signature(s) Signed: 06/28/2022 3:19:57 PM By: Levora Dredge Signed: 06/28/2022 4:21:04 PM By: [name] Entered By: [name] on 06/28/2022 11:31:18 Laura Santiago ([id number]) -------------------------------------------------------------------------------- Pain Assessment Details Patient Name: Laura Santiago. Date of Service:  06/28/2022 10:30 AM Medical Record Number: [id number] Patient Account Number: [id number] Date of Birth/Sex: 16-Mar-1946 (76 y.o. F) Treating RN: Levora Dredge Primary Care Rosely Fernandez: [facility], [place] Other Clinician: [name] Referring Violett Hobbs: [facility] Treating Anamaria Dusenbury/Extender: Yaakov Guthrie in Treatment: 44 Active Problems Location of Pain Severity and Description of Pain Patient Has Paino Yes Site Locations Pain Location: Generalized Pain, Pain in Ulcers Duration of the Pain. Constant / Intermittento Intermittent Rate the pain. Current Pain Level: 3 Character of Pain Describe the Pain: Aching, Burning Pain Management and Medication Current Pain Management: Medication: Yes Electronic Signature(s) Signed: 06/28/2022 3:19:57 PM By: Levora Dredge Signed: 06/28/2022 4:21:04 PM By: [name] Entered By: [name] on 06/28/2022 11:06:58 Laura Santiago ([id number]) -------------------------------------------------------------------------------- Patient/Caregiver Education Details Patient Name: Laura Santiago, Laura Santiago. Date of Service: 06/28/2022 10:30 AM Medical Record Number: [id number] Patient Account Number: [id number] Date of Birth/Gender: [birth year] (76 y.o. F) Treating RN: Levora Dredge Primary Care Physician: [facility], [place] Other Clinician: [name] Referring Physician: [facility] Treating Physician/Extender: Yaakov Guthrie in Treatment: 32 Education Assessment Education Provided To: Patient Education Topics Provided Wound/Skin Impairment: Handouts: Other: continue wound care as directed Methods: Explain/Verbal Responses: State content correctly Electronic Signature(s) Signed: 06/28/2022 4:21:04 PM By: [name] Entered By: [name] on 06/28/2022 11:51:59 Laura Santiago ([id number]) -------------------------------------------------------------------------------- Wound Assessment  Details Patient Name: Laura Santiago. Date  of Service: 06/28/2022 10:30 AM Medical Record Number: [id number] Patient Account Number: [id number] Date of Birth/Sex: [birth year] (76 y.o. F) Treating RN: Levora Dredge Primary Care Lenny Bouchillon: [facility], [place] Other Clinician: [name] Referring Abrie Egloff: [facility] Treating Mahathi Pokorney/Extender: Yaakov Guthrie in Treatment: 51 Wound Status Wound Number: 1 Primary Venous Leg Ulcer Etiology: Wound Location: Left, Medial Lower Leg Wound Status: Open Wounding Event: Gradually Appeared Comorbid Hypertension, Osteoarthritis, Received Chemotherapy, Date Acquired: 04/06/2021 History: Received Radiation Weeks Of Treatment: 51 Clustered Wound: Yes Photos Wound Measurements Length: (cm) 6.1 Width: (cm) 1 Depth: (cm) 0.1 Area: (cm) 4.791 Volume: (cm) 0.479 % Reduction in Area: 94.7% % Reduction in Volume: 97.3% Epithelialization: Small (1-33%) Wound Description Classification: Full Thickness Without Exposed Support Structures Wound Margin: Flat and Intact Exudate Amount: Medium Exudate Type: Serosanguineous Exudate Color: red, brown Foul Odor After Cleansing: No Slough/Fibrino Yes Wound Bed Granulation Amount: Medium (34-66%) Exposed Structure Granulation Quality: Red, Pink Fascia Exposed: No Necrotic Amount: Medium (34-66%) Fat Layer (Subcutaneous Tissue) Exposed: Yes Necrotic Quality: Adherent Slough Tendon Exposed: No Muscle Exposed: No Joint Exposed: No Bone Exposed: No Treatment Notes Wound #1 (Lower Leg) Wound Laterality: Left, Medial Cleanser Wound Cleanser Discharge Instruction: Wash your hands with soap and water. Remove old dressing, discard into plastic bag and place into trash. Cleanse the wound with Wound Cleanser prior to applying a clean dressing using gauze sponges, not tissues or cotton balls. Do not scrub or use excessive force. Pat dry using gauze sponges, not tissue or cotton  balls. Laura Santiago, Laura Santiago ([id number]) Peri-Wound Care Nystatin Cream USP 30 (g) Discharge Instruction: Use Nystatin Cream as directed. MIx 1:1 with TCA cream and applied to peri wound Triamcinolone Acetonide Cream, 0.1%, 15 (g) tube Discharge Instruction: Apply as directed. mix 1:1 with nystatin cream for peri wound Topical Primary Dressing Aquacel Extra Hydrofiber Dressing, 4x5 (in/in) keystone compound Discharge Instruction: apply to wound bed as directed on medication label PLEASE follow direction on pill bottle for proper mixing Secondary Dressing Zetuvit Absorbent Pad, 4x8 (in/in) Secured With Compression Wrap 3-LAYER WRAP - Profore Lite LF 3 Multilayer Compression Bandaging System Discharge Instruction: Apply 3 multi-layer wrap as prescribed. Compression Stockings Add-Ons Electronic Signature(s) Signed: 06/28/2022 3:19:57 PM By: Levora Dredge Signed: 06/28/2022 4:21:04 PM By: [name] Entered By: [name] on 06/28/2022 11:29:54 Laura Santiago ([id number]) -------------------------------------------------------------------------------- Vitals Details Patient Name: Laura Santiago. Date of Service: 06/28/2022 10:30 AM Medical Record Number: [id number] Patient Account Number: [id number] Date of Birth/Sex: 1946/08/16 (76 y.o. F) Treating RN: Levora Dredge Primary Care Alfonza Toft: [facility], [place] Other Clinician: [name] Referring Avon Mergenthaler: [facility] Treating Sakinah Rosamond/Extender: Yaakov Guthrie in Treatment: 94 Vital Signs Time Taken: 11:05 Temperature (F): 98.0 Height (in): 66 Pulse (bpm): 67 Weight (lbs): 153 Respiratory Rate (breaths/min): 18 Body Mass Index (BMI): 24.7 Blood Pressure (mmHg): 129/71 Reference Range: 80 - 120 mg / dl Electronic Signature(s) Signed: 06/28/2022 4:21:04 PM By: [name] Entered By: [name] on 06/28/2022 11:06:52

## 2022-06-28 NOTE — Progress Notes (Signed)
MADISSEN, WYSE ([id number]) Visit Report for 06/28/2022 Chief Complaint Document Details Patient Name: Laura Santiago, Laura Santiago. Date of Service: 06/28/2022 10:30 AM Medical Record Number: [id number] Patient Account Number: [id number] Date of Birth/Sex: 1946-11-18 (76 y.o. F) Treating RN: Levora Dredge Primary Care Provider: [facility], [place] Other Clinician: [name] Referring Provider: [facility] Treating Provider/Extender: Yaakov Guthrie in Treatment: 6 Information Obtained from: Patient Chief Complaint Left lower extremity wound Right toe wounds Left upper lateral thigh wounds Electronic Signature(s) Signed: 06/28/2022 12:03:59 PM By: Kalman Shan DO Entered By: Kalman Shan on 06/28/2022 11:53:49 Laura Santiago ([id number]) -------------------------------------------------------------------------------- HPI Details Patient Name: Laura Santiago. Date of Service: 06/28/2022 10:30 AM Medical Record Number: [id number] Patient Account Number: [id number] Date of Birth/Sex: Oct 13, 1946 (76 y.o. F) Treating RN: Levora Dredge Primary Care Provider: [facility], [place] Other Clinician: [name] Referring Provider: [facility] Treating Provider/Extender: Yaakov Guthrie in Treatment: 49 History of Present Illness HPI Description: Admission 7/27 [name] is a 76 year old female with a past medical history of ADHD, metastatic breast cancer, stage IV chronic kidney disease, history of DVT on Xarelto and chronic venous insufficiency that presents to the clinic for a chronic left lower extremity wound. She recently moved to [facility] 4 days ago. She was being followed by wound care center in [name]. She reports a 10-year history of wounds to her left lower extremity that eventually do heal with debridement and compression therapy. She states that the current wound reopened 4 months ago and she is using  Vaseline and Coban. She denies signs of infection. 8/3; patient presents for 1 week follow-up. She reports no issues or complaints today. She states she had vascular studies done in the last week. She denies signs of infection. She brought her little service dog with her today. 8/17; patient presents for follow-up. She has missed her last clinic appointment. She states she took the wrap off and attempted to rewrap her leg. She is having difficulty with transportation. She has her service dog with her today. Overall she feels well and reports improvement in wound healing. She denies signs of infection. She reports owning an old Velcro wrap compression and has this at her living facility 9/14; patient presents for follow-up. Patient states that over the past 2 to 3 weeks she developed toe wounds to her right foot. She attributes this to tight fitting shoes. She subsequently developed cellulitis in the right leg and has been treated by doxycycline by her oncologist. She reports improvement in symptoms however continues to have some redness and swelling to this leg. To the left lower extremity patient has been having her wraps changed with home health twice weekly. She states that the [facility] is not helping control the drainage. Other than that she has no issues or complaints today. She denies signs of infection to the left lower extremity. 9/21; patient presents for follow-up. She reports seeing infectious disease for her cellulitis. She reports no further management. She has home health that changes the wraps twice weekly. She has no issues or complaints today. She denies signs of infection. 10/5; patient presents for follow-up. She has no issues or complaints today. She denies signs of infection. She states that the right great toe has not been dressed by home health. 10/12; patient presents for follow-up. She has no issues or complaints today. She reports improvement in her wound healing. She  has been using silver alginate to the right great toe wound. She denies  signs of infection. 10/26; patient presents for follow-up. Home health did not have sorbact so they continued to use Hydrofera Blue under the wrap. She has been using silver alginate to the great toe wound however she did not have a dressing in place today. She currently denies signs of infection. 11/2; patient presents for follow-up. She has been using sorb act under the compression wrap. She reports using silver alginate to the toe wound again she does not have a dressing in place. She currently denies signs of infection. 11/23; patient presents for follow-up. Unfortunately she has missed her last 2 clinic appointments. She was last seen 3 weeks ago. She did her own compression wrap with Kerlix and Coban yesterday after seeing vein and vascular. She has not been dressing her right great toe wound. She currently denies signs of infection. 11/30; patient presents for 1 week follow-up. She states she changed her dressing last week prior to home health and use sorb act with Dakin's and Hydrofera Blue. Home health has changed the dressing as well and they have been using sorbact. Today she reports increased redness to her right lower extremity. She has a history of cellulitis to this leg. She has been using silver alginate to the right great toe. Unfortunately she had an episode of diarrhea prior to coming in and had feces all over the right leg and to the wrap of her left leg. 12/7; patient presents for 1 week follow-up. She states that home health did not come out to change the dressing and she took it off yesterday. It is unclear if she is dressing the right toe wound. She denies signs of infection. 12/14; patient presents for 1 week follow-up. She has no issues or complaints today. 12/21; patient presents for follow-up. She has no issues or complaints today. She denies signs of infection. 12/28/2021; patient presents for  follow-up. She was hospitalized for sepsis secondary to right lower extremity cellulitis On 12/23. She states she is currently at a SNF. She states that she was started on doxycycline this morning for her right great toe swelling and redness. She is not sure what dressings have been done to her left lower extremity for the past 3 weeks. She says its been mainly gauze with an Ace wrap. 1/25; patient presents for follow-up. She is still residing in a skilled nursing facility. She reports mild pain to the left lower extremity wound bed. She states she is going to see a podiatrist soon. 2/8; patient presents for follow-up. She has moved back to her residential community from her skilled nursing facility. She has no issues or complaints today. She denies signs of systemic infections. 2/15; patient presents for follow-up. He has no issues or complaints today. She denies systemic signs of infection. 2/22; patient presents for follow-up. She has no issues or complaints today. She denies signs of infection. DILLYN, JOAQUIN ([id number]) 3/1; patient presents for follow-up. She states that home health came out the day after she was seen in our clinic and yesterday to do the wrap change. She denies signs of infection. She reports excoriated skin on the ankle. 3/8; patient presents for follow-up. She has no issues or complaints today. She denies signs of infection. 3/15; patient presents for follow-up. Home health has been coming out to change the dressings. She reports more tenderness to the wound site. She denies purulent drainage, increased warmth or erythema to the area. 4/5; patient presents for follow-up. She has missed her last 2 clinic appointments. I have  not seen her in 3 weeks. She was recently hospitalized for altered mental status. She was involuntarily committed. She was evaluated by psychiatry and deemed to have competency. There was no specific cause of her altered mental status. It was  concluded that her physical and mental health were declining due to her chronic medical conditions. Currently home health has been coming out for dressing changes. Patient has also been doing her own dressing changes. She reports more skin breakdown to the periwound and now has a new wound. She denies fever/chills. She reports continued tenderness to the wound site. 4/12; patient with significant venous insufficiency and a large wound on her left lower leg taking up about 80% of the circumference of her lower leg. Cultures of this grew MRSA and Pseudomonas. She had completed a course of ciprofloxacin now is starting doxycycline. She has been using Dakin's wet-to-dry and a Tubigrip. She has home health twice a week and we change it once. 4/19; patient presents for follow-up. She completed her course of doxycycline. She has been using Dakin's wet-to-dry dressing and Tubigrip. Home health changes the dressing twice weekly. Currently she has no issues or complaints. 4/26; patient presents for follow-up. At last clinic visit orders for home health were Iodosorb under compression therapy. Unfortunately they did not have the dressing and have been using Dakin's and gentamicin under the wrap. Patient currently denies signs of infection. She has no issues or complaints today. 5/3; patient presents for follow-up. Again Iodosorb has not been used under the compression therapy when home health comes out to change the wrap and dressing. They have been using Sorbact. It is unclear why this is happening since we send orders weekly to the agency. She denies signs of infection. Patient has not purchased the [place] antibiotics. We reached out to the company and they said they have been trying to contact her on a regular basis. We gave the patient the number to call to order the medication. 5/10; patient presents for follow-up. She has no issues or complaints today. Again home health has not been using Iodosorb.  Mepilex was on the wound bed. No other dressings noted. She brought in her Keystone antibiotics. She denies signs of infection. 5/17; patient presents for follow-up. Home health has come out twice since she was last seen. Joint well she has been using Keystone antibiotic with Sorbact under the compression wrap. She has no issues or complaints today. She denies signs of infection. 5/24; patient presents for follow-up. We have been using Keystone antibiotics with Sorbact under compression therapy. She is tolerating the treatment well. She is reporting improvement in wound healing. She denies signs of infection. 5/31; patient presents for follow-up. We continue to do [facility] antibiotics with Sorbact under compression therapy. She continues to report improvement in wound healing. Home health comes out and changes the dressing once weekly. 05-17-2022 upon evaluation today patient appears to be doing better in regard to her wound especially compared to the last time I saw her. Fortunately I do think that she is seeing improvements. With that being said I do believe that she may be benefit from sharp debridement today to clear away some of the necrotic debris I discussed that with her as well. She is an amendable to that plan. Otherwise she is very pleased with how the [employer] is doing for her. 6/14; patient presents for follow-up. We have been using Keystone antibiotic with Sorbact and absorbent dressings under 3 layer compression. She has no issues or complaints today.  She reports improvement in wound healing. She denies signs of infection. 6/21; patient presents for follow-up. We are continuing with [facility] antibiotic and Sorbact under 3 layer compression. Patient has no complaints. Continued wound healing is happening. She denies signs of infection. 6/28; patient presents for follow-up. We have been using Keystone antibiotic with Sorbact under 3 layer compression. Usually home health comes out and  changes the dressing twice a week. Unfortunately they did not go out to change the dressing. It is unclear why. Patient did not call them. She currently denies signs of infection. 7/5; patient presents for follow-up. We have been using Keystone antibiotic with calcium alginate under 3 layer compression. She reports improvement in wound healing. She denies signs of infection. Home health has come out to do dressing changes twice this past week. 7/12; patient presents for follow-up. We have been using Keystone antibiotic with calcium alginate under 3 layer compression. Patient states that home health came out once last week to change the dressing. She reports improvement in wound healing. She currently denies signs of infection. 7/19; patient presents for follow-up. We have been using Keystone antibiotic with calcium alginate under 3 layer compression. Home health came out once last week to change the dressing. She has no issues or complaints today. She denies signs of infection. Electronic Signature(s) Signed: 06/28/2022 12:03:59 PM By: Kalman Shan DO Entered By: Kalman Shan on 06/28/2022 11:54:16 Laura Santiago ([id number]) -------------------------------------------------------------------------------- Physical Exam Details Patient Name: QUINNETTA, ROEPKE. Date of Service: 06/28/2022 10:30 AM Medical Record Number: [id number] Patient Account Number: [id number] Date of Birth/Sex: [birth year] (76 y.o. F) Treating RN: Levora Dredge Primary Care Provider: [facility], [place] Other Clinician: [name] Referring Provider: [facility] Treating Provider/Extender: Yaakov Guthrie in Treatment: 57 Constitutional . Cardiovascular . Psychiatric . Notes Left lower extremity: 2 open wounds to the distal aspect of the leg located to the anterior and posterior sides. The open areas have granulation tissue and slough. No signs of surrounding infection. Good edema  control. Electronic Signature(s) Signed: 06/28/2022 12:03:59 PM By: Kalman Shan DO Entered By: Kalman Shan on 06/28/2022 11:55:25 Laura Santiago ([id number]) -------------------------------------------------------------------------------- Physician Orders Details Patient Name: Laura Santiago. Date of Service: 06/28/2022 10:30 AM Medical Record Number: [id number] Patient Account Number: [id number] Date of Birth/Sex: October 24, 1946 (76 y.o. F) Treating RN: Levora Dredge Primary Care Provider: [facility], [place] Other Clinician: [name] Referring Provider: [facility] Treating Provider/Extender: Yaakov Guthrie in Treatment: 53 Verbal / Phone Orders: No Diagnosis Coding Follow-up Appointments o Return Appointment in 1 week. o Nurse Visit as needed [facility] for wound care. May utilize formulary equivalent dressing for wound treatment orders unless otherwise specified. Home Health Nurse may visit PRN to address patientos wound care needs. o **Please direct any NON-WOUND related issues/requests for orders to patient's Primary Care Physician. **If current dressing causes regression in wound condition, may D/C ordered dressing product/s and apply Normal Saline Moist Dressing daily until next [place] or Other MD appointment. **Notify W[facility] Healing Center of regression in wound condition at [phone]. o Other Home Health Orders/Instructions: - Dressing change 3 x weekly, once by wound care and once at wound clinic weekly. PLEASE make sure frequency between dressing changes is appropriate. Bathing/ Shower/ Hygiene o May shower with wound dressing protected with water repellent cover or cast protector. o No tub bath. Anesthetic (Use 'Patient Medications' Section for Anesthetic Order Entry) o Lidocaine applied  to wound bed Edema Control - Lymphedema / Segmental  Compressive Device / Other o Optional: One layer of unna paste to top of compression wrap (to act as an anchor). - PLEASE when applying wrap start from toes and go up to just below knee o Elevate, Exercise Daily and Avoid Standing for Long Periods of Time. o Elevate legs to the level of the heart and pump ankles as often as possible o Elevate leg(s) parallel to the floor when sitting. o DO YOUR BEST to sleep in the bed at night. DO NOT sleep in your recliner. Long hours of sitting in a recliner leads to swelling of the legs and/or potential wounds on your backside. Additional Orders / Instructions o Follow Nutritious Diet and Increase Protein Intake Wound Treatment Wound #1 - Lower Leg Wound Laterality: Left, Medial Cleanser: Wound Cleanser (Home Health) 3 x Per Week/30 Days Discharge Instructions: Wash your hands with soap and water. Remove old dressing, discard into plastic bag and place into trash. Cleanse the wound with Wound Cleanser prior to applying a clean dressing using gauze sponges, not tissues or cotton balls. Do not scrub or use excessive force. Pat dry using gauze sponges, not tissue or cotton balls. Peri-Wound Care: Nystatin Cream USP 30 (g) 3 x Per Week/30 Days Discharge Instructions: Use Nystatin Cream as directed. MIx 1:1 with TCA cream and applied to peri wound Peri-Wound Care: Triamcinolone Acetonide Cream, 0.1%, 15 (g) tube 3 x Per Week/30 Days Discharge Instructions: Apply as directed. mix 1:1 with nystatin cream for peri wound Primary Dressing: Aquacel Extra Hydrofiber Dressing, 4x5 (in/in) 3 x Per Week/30 Days Primary Dressing: keystone compound 3 x Per Week/30 Days Discharge Instructions: apply to wound bed as directed on medication label PLEASE follow direction on pill bottle for proper mixing Secondary Dressing: Zetuvit Absorbent Pad, 4x8 (in/in) 3 x Per Week/30 Days Compression Wrap: 3-LAYER WRAP - Profore Lite LF 3 Multilayer Compression Bandaging  System 3 x Per Week/30 Days Discharge Instructions: Apply 3 multi-layer wrap as prescribed. SHERMAN, LIPUMA ([id number]) Electronic Signature(s) Signed: 06/28/2022 12:03:59 PM By: Kalman Shan DO Entered By: Kalman Shan on 06/28/2022 11:56:48 BROOKELYNN, HAMOR ([id number]) -------------------------------------------------------------------------------- Problem List Details Patient Name: JASHIYA, BASSETT. Date of Service: 06/28/2022 10:30 AM Medical Record Number: [id number] Patient Account Number: [id number] Date of Birth/Sex: [birth year] (76 y.o. F) Treating RN: Levora Dredge Primary Care Provider: [facility], [place] Other Clinician: [name] Referring Provider: [facility] Treating Provider/Extender: Yaakov Guthrie in Treatment: 69 Active Problems ICD-10 Encounter Code Description Active Date MDM Diagnosis L97.822 Non-pressure chronic ulcer of other part of left lower leg with fat layer 11/02/2021 No Yes exposed I87.312 Chronic venous hypertension (idiopathic) with ulcer of left lower 11/02/2021 No Yes extremity I87.2 Venous insufficiency (chronic) (peripheral) 07/06/2021 No Yes Z79.01 Long term (current) use of anticoagulants 07/06/2021 No Yes I10 Essential (primary) hypertension 07/06/2021 No Yes 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 07/06/2021 No Yes Inactive Problems ICD-10 Code Description Active Date Inactive Date S81.802A Unspecified open wound, left lower leg, initial encounter 07/06/2021 07/06/2021 S91.101A Unspecified open wound of right great toe without damage to nail, initial 08/24/2021 08/24/2021 encounter S91.104A Unspecified open wound of right lesser toe(s) without damage to nail, initial 08/24/2021 08/24/2021 encounter Resolved Problems ICD-10 Code Description Active Date Resolved Date S91.104D Unspecified open wound of right lesser toe(s) without damage to nail, 08/31/2021 08/31/2021 subsequent encounter ETOY, MCDONNELL ([id number]) S91.201D Unspecified open wound of right great toe with damage to nail, subsequent 08/31/2021  08/31/2021 encounter Electronic Signature(s) Signed: 06/28/2022 12:03:59 PM By: Kalman Shan DO Entered By: Kalman Shan on 06/28/2022 11:53:46 Laura Santiago ([id number]) -------------------------------------------------------------------------------- Progress Note Details Patient Name: Laura Santiago. Date of Service: 06/28/2022 10:30 AM Medical Record Number: [id number] Patient Account Number: [id number] Date of Birth/Sex: May 01, 1946 (75 y.o. F) Treating RN: Levora Dredge Primary Care Provider: [facility], [place] Other Clinician: [name] Referring Provider: [facility] Treating Provider/Extender: Yaakov Guthrie in Treatment: 73 Subjective Chief Complaint Information obtained from Patient Left lower extremity wound Right toe wounds Left upper lateral thigh wounds History of Present Illness (HPI) Admission 7/27 Ms. Ammarie Matsuura is a 76 year old female with a past medical history of ADHD, metastatic breast cancer, stage IV chronic kidney disease, history of DVT on Xarelto and chronic venous insufficiency that presents to the clinic for a chronic left lower extremity wound. She recently moved to [facility] 4 days ago. She was being followed by wound care center in [name]. She reports a 10-year history of wounds to her left lower extremity that eventually do heal with debridement and compression therapy. She states that the current wound reopened 4 months ago and she is using Vaseline and Coban. She denies signs of infection. 8/3; patient presents for 1 week follow-up. She reports no issues or complaints today. She states she had vascular studies done in the last week. She denies signs of infection. She brought her little service dog with her today. 8/17; patient presents for follow-up. She has missed her last clinic appointment.  She states she took the wrap off and attempted to rewrap her leg. She is having difficulty with transportation. She has her service dog with her today. Overall she feels well and reports improvement in wound healing. She denies signs of infection. She reports owning an old Velcro wrap compression and has this at her living facility 9/14; patient presents for follow-up. Patient states that over the past 2 to 3 weeks she developed toe wounds to her right foot. She attributes this to tight fitting shoes. She subsequently developed cellulitis in the right leg and has been treated by doxycycline by her oncologist. She reports improvement in symptoms however continues to have some redness and swelling to this leg. To the left lower extremity patient has been having her wraps changed with home health twice weekly. She states that the [facility] is not helping control the drainage. Other than that she has no issues or complaints today. She denies signs of infection to the left lower extremity. 9/21; patient presents for follow-up. She reports seeing infectious disease for her cellulitis. She reports no further management. She has home health that changes the wraps twice weekly. She has no issues or complaints today. She denies signs of infection. 10/5; patient presents for follow-up. She has no issues or complaints today. She denies signs of infection. She states that the right great toe has not been dressed by home health. 10/12; patient presents for follow-up. She has no issues or complaints today. She reports improvement in her wound healing. She has been using silver alginate to the right great toe wound. She denies signs of infection. 10/26; patient presents for follow-up. Home health did not have sorbact so they continued to use Hydrofera Blue under the wrap. She has been using silver alginate to the great toe wound however she did not have a dressing in place today. She currently denies signs of  infection. 11/2; patient presents for follow-up. She has been using sorb act  under the compression wrap. She reports using silver alginate to the toe wound again she does not have a dressing in place. She currently denies signs of infection. 11/23; patient presents for follow-up. Unfortunately she has missed her last 2 clinic appointments. She was last seen 3 weeks ago. She did her own compression wrap with Kerlix and Coban yesterday after seeing vein and vascular. She has not been dressing her right great toe wound. She currently denies signs of infection. 11/30; patient presents for 1 week follow-up. She states she changed her dressing last week prior to home health and use sorb act with Dakin's and Hydrofera Blue. Home health has changed the dressing as well and they have been using sorbact. Today she reports increased redness to her right lower extremity. She has a history of cellulitis to this leg. She has been using silver alginate to the right great toe. Unfortunately she had an episode of diarrhea prior to coming in and had feces all over the right leg and to the wrap of her left leg. 12/7; patient presents for 1 week follow-up. She states that home health did not come out to change the dressing and she took it off yesterday. It is unclear if she is dressing the right toe wound. She denies signs of infection. 12/14; patient presents for 1 week follow-up. She has no issues or complaints today. 12/21; patient presents for follow-up. She has no issues or complaints today. She denies signs of infection. 12/28/2021; patient presents for follow-up. She was hospitalized for sepsis secondary to right lower extremity cellulitis On 12/23. She states she is currently at a SNF. She states that she was started on doxycycline this morning for her right great toe swelling and redness. She is not sure what dressings have been done to her left lower extremity for the past 3 weeks. She says its been mainly  gauze with an Ace wrap. 1/25; patient presents for follow-up. She is still residing in a skilled nursing facility. She reports mild pain to the left lower extremity wound bed. She states she is going to see a podiatrist soon. 2/8; patient presents for follow-up. She has moved back to her residential community from her skilled nursing facility. She has no issues or complaints today. She denies signs of systemic infections. CARLINA, DERKS ([id number]) 2/15; patient presents for follow-up. He has no issues or complaints today. She denies systemic signs of infection. 2/22; patient presents for follow-up. She has no issues or complaints today. She denies signs of infection. 3/1; patient presents for follow-up. She states that home health came out the day after she was seen in our clinic and yesterday to do the wrap change. She denies signs of infection. She reports excoriated skin on the ankle. 3/8; patient presents for follow-up. She has no issues or complaints today. She denies signs of infection. 3/15; patient presents for follow-up. Home health has been coming out to change the dressings. She reports more tenderness to the wound site. She denies purulent drainage, increased warmth or erythema to the area. 4/5; patient presents for follow-up. She has missed her last 2 clinic appointments. I have not seen her in 3 weeks. She was recently hospitalized for altered mental status. She was involuntarily committed. She was evaluated by psychiatry and deemed to have competency. There was no specific cause of her altered mental status. It was concluded that her physical and mental health were declining due to her chronic medical conditions. Currently home health has been coming out  for dressing changes. Patient has also been doing her own dressing changes. She reports more skin breakdown to the periwound and now has a new wound. She denies fever/chills. She reports continued tenderness to the wound  site. 4/12; patient with significant venous insufficiency and a large wound on her left lower leg taking up about 80% of the circumference of her lower leg. Cultures of this grew MRSA and Pseudomonas. She had completed a course of ciprofloxacin now is starting doxycycline. She has been using Dakin's wet-to-dry and a Tubigrip. She has home health twice a week and we change it once. 4/19; patient presents for follow-up. She completed her course of doxycycline. She has been using Dakin's wet-to-dry dressing and Tubigrip. Home health changes the dressing twice weekly. Currently she has no issues or complaints. 4/26; patient presents for follow-up. At last clinic visit orders for home health were Iodosorb under compression therapy. Unfortunately they did not have the dressing and have been using Dakin's and gentamicin under the wrap. Patient currently denies signs of infection. She has no issues or complaints today. 5/3; patient presents for follow-up. Again Iodosorb has not been used under the compression therapy when home health comes out to change the wrap and dressing. They have been using Sorbact. It is unclear why this is happening since we send orders weekly to the agency. She denies signs of infection. Patient has not purchased the [facility] antibiotics. We reached out to the company and they said they have been trying to contact her on a regular basis. We gave the patient the number to call to order the medication. 5/10; patient presents for follow-up. She has no issues or complaints today. Again home health has not been using Iodosorb. Mepilex was on the wound bed. No other dressings noted. She brought in her Keystone antibiotics. She denies signs of infection. 5/17; patient presents for follow-up. Home health has come out twice since she was last seen. Joint well she has been using Keystone antibiotic with Sorbact under the compression wrap. She has no issues or complaints today. She denies  signs of infection. 5/24; patient presents for follow-up. We have been using Keystone antibiotics with Sorbact under compression therapy. She is tolerating the treatment well. She is reporting improvement in wound healing. She denies signs of infection. 5/31; patient presents for follow-up. We continue to do [facility] antibiotics with Sorbact under compression therapy. She continues to report improvement in wound healing. Home health comes out and changes the dressing once weekly. 05-17-2022 upon evaluation today patient appears to be doing better in regard to her wound especially compared to the last time I saw her. Fortunately I do think that she is seeing improvements. With that being said I do believe that she may be benefit from sharp debridement today to clear away some of the necrotic debris I discussed that with her as well. She is an amendable to that plan. Otherwise she is very pleased with how the [employer] is doing for her. 6/14; patient presents for follow-up. We have been using Keystone antibiotic with Sorbact and absorbent dressings under 3 layer compression. She has no issues or complaints today. She reports improvement in wound healing. She denies signs of infection. 6/21; patient presents for follow-up. We are continuing with [facility] antibiotic and Sorbact under 3 layer compression. Patient has no complaints. Continued wound healing is happening. She denies signs of infection. 6/28; patient presents for follow-up. We have been using Keystone antibiotic with Sorbact under 3 layer compression. Usually home health  comes out and changes the dressing twice a week. Unfortunately they did not go out to change the dressing. It is unclear why. Patient did not call them. She currently denies signs of infection. 7/5; patient presents for follow-up. We have been using Keystone antibiotic with calcium alginate under 3 layer compression. She reports improvement in wound healing. She denies signs of  infection. Home health has come out to do dressing changes twice this past week. 7/12; patient presents for follow-up. We have been using Keystone antibiotic with calcium alginate under 3 layer compression. Patient states that home health came out once last week to change the dressing. She reports improvement in wound healing. She currently denies signs of infection. 7/19; patient presents for follow-up. We have been using Keystone antibiotic with calcium alginate under 3 layer compression. Home health came out once last week to change the dressing. She has no issues or complaints today. She denies signs of infection. Objective MICHELA, HERST ([id number]) Constitutional Vitals Time Taken: 11:05 AM, Height: 66 in, Weight: 153 lbs, BMI: 24.7, Temperature: 98.0 F, Pulse: 67 bpm, Respiratory Rate: 18 breaths/min, Blood Pressure: 129/71 mmHg. General Notes: Left lower extremity: 2 open wounds to the distal aspect of the leg located to the anterior and posterior sides. The open areas have granulation tissue and slough. No signs of surrounding infection. Good edema control. Integumentary (Hair, Skin) Wound #1 status is Open. Original cause of wound was Gradually Appeared. The date acquired was: 04/06/2021. The wound has been in treatment 51 weeks. The wound is located on the Left,Medial Lower Leg. The wound measures 6.1cm length x 1cm width x 0.1cm depth; 4.791cm^2 area and 0.479cm^3 volume. There is Fat Layer (Subcutaneous Tissue) exposed. There is a medium amount of serosanguineous drainage noted. The wound margin is flat and intact. There is medium (34-66%) red, pink granulation within the wound bed. There is a medium (34-66%) amount of necrotic tissue within the wound bed including Adherent Slough. 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 I debrided nonviable tissue. I recommended continuing calcium alginate with Keystone under 3 layer compression. Follow-up in 1 week. Procedures Wound #1 Pre-procedure diagnosis of Wound #1 is a Venous Leg Ulcer located on the Left,Medial Lower Leg . There was a Three Layer Compression Therapy Procedure with a pre-treatment ABI of 1.5 by Levora Dredge, RN. Post procedure Diagnosis Wound #1: Same as Pre-Procedure Plan Follow-up Appointments: Return Appointment in 1 week. Nurse Visit as needed Home Health: [place] for wound care. May utilize formulary equivalent dressing for wound treatment orders unless otherwise specified. Home Health Nurse may visit PRN to address patient s wound care needs. **Please direct any NON-WOUND related issues/requests for orders to patient's Primary Care Physician. **If current dressing causes regression in wound condition, may D/C ordered dressing product/s and apply Normal Saline Moist Dressing daily until next [place] or Other MD appointment. **Notify [facility] of regression in wound condition at [phone]. Other Home Health Orders/Instructions: - Dressing change 3 x weekly, once by wound care and once at wound clinic weekly. PLEASE make sure frequency between dressing changes is appropriate. Bathing/ Shower/ Hygiene: May shower with wound dressing protected with water repellent cover or cast protector.  No tub bath. Anesthetic (Use 'Patient Medications' Section for Anesthetic Order Entry): Lidocaine applied to wound bed Edema Control - Lymphedema / Segmental Compressive Device / Other: Optional: One layer of unna paste to top of compression wrap (to act as an anchor). - PLEASE when applying  wrap start from toes and go up to ALLYSE, FREGEAU. ([id number]) just below knee Elevate, Exercise Daily and Avoid Standing for Long Periods of Time. Elevate legs to the level of the heart and pump ankles as often as possible Elevate leg(s) parallel to the floor when sitting. DO YOUR BEST to sleep in the bed at night. DO NOT sleep in your recliner. Long hours of sitting in a recliner leads to swelling of the legs and/or potential wounds on your backside. Additional Orders / Instructions: Follow Nutritious Diet and Increase Protein Intake WOUND #1: - Lower Leg Wound Laterality: Left, Medial Cleanser: Wound Cleanser (Home Health) 3 x Per Week/30 Days Discharge Instructions: Wash your hands with soap and water. Remove old dressing, discard into plastic bag and place into trash. Cleanse the wound with Wound Cleanser prior to applying a clean dressing using gauze sponges, not tissues or cotton balls. Do not scrub or use excessive force. Pat dry using gauze sponges, not tissue or cotton balls. Peri-Wound Care: Nystatin Cream USP 30 (g) 3 x Per Week/30 Days Discharge Instructions: Use Nystatin Cream as directed. MIx 1:1 with TCA cream and applied to peri wound Peri-Wound Care: Triamcinolone Acetonide Cream, 0.1%, 15 (g) tube 3 x Per Week/30 Days Discharge Instructions: Apply as directed. mix 1:1 with nystatin cream for peri wound Primary Dressing: Aquacel Extra Hydrofiber Dressing, 4x5 (in/in) 3 x Per Week/30 Days Primary Dressing: keystone compound 3 x Per Week/30 Days Discharge Instructions: apply to wound bed as directed on medication label PLEASE follow direction on pill bottle for proper mixing Secondary Dressing: Zetuvit Absorbent Pad, 4x8 (in/in) 3 x Per Week/30 Days Compression Wrap: 3-LAYER WRAP - Profore Lite LF 3 Multilayer Compression Bandaging System 3 x Per Week/30 Days Discharge Instructions: Apply 3 multi-layer wrap as prescribed. 1. In office sharp debridement 2. Calcium alginate  with Keystone under 3 layer compression 3. Follow-up in 1 week Electronic Signature(s) Signed: 06/28/2022 12:03:59 PM By: Kalman Shan DO Entered By: Kalman Shan on 06/28/2022 11:56:01 Laura Santiago ([id number]) -------------------------------------------------------------------------------- ROS/PFSH Details Patient Name: Laura Santiago. Date of Service: 06/28/2022 10:30 AM Medical Record Number: [id number] Patient Account Number: [id number] Date of Birth/Sex: 29-Dec-1945 (76 y.o. F) Treating RN: Levora Dredge Primary Care Provider: [facility], [place] Other Clinician: [name] Referring Provider: [facility] Treating Provider/Extender: Yaakov Guthrie in Treatment: 82 Information Obtained From Patient Eyes Medical History: Negative for: Cataracts; Glaucoma; Optic Neuritis Ear/Nose/Mouth/Throat Medical History: Negative for: Chronic sinus problems/congestion; Middle ear problems Hematologic/Lymphatic Medical History: Negative for: Anemia; Hemophilia; Human Immunodeficiency Virus; Lymphedema; Sickle Cell Disease Respiratory Medical History: Negative for: Aspiration; Asthma; Chronic Obstructive Pulmonary Disease (COPD); Pneumothorax; Sleep Apnea; Tuberculosis Cardiovascular Medical History: Positive for: Hypertension Negative for: Angina; Arrhythmia; Congestive Heart Failure; Coronary Artery Disease; Deep Vein Thrombosis; Hypotension; Myocardial Infarction; Peripheral Arterial Disease; Peripheral Venous Disease; Phlebitis; Vasculitis Gastrointestinal Medical History: Negative for: Cirrhosis ; Colitis; Crohnos; Hepatitis A; Hepatitis B; Hepatitis C Endocrine Medical History: Negative for: Type I Diabetes; Type II Diabetes Genitourinary Medical History: Negative for: End Stage Renal Disease Immunological Medical History: Negative for: Lupus Erythematosus; Raynaudos; Scleroderma Integumentary (Skin) Medical History: Negative for:  History of Burn; History of pressure wounds Musculoskeletal Medical History: Positive for: Osteoarthritis  SHANY, MARINEZ ([id number]) Negative for: Gout; Rheumatoid Arthritis; Osteomyelitis Oncologic Medical History: Positive for: Received Chemotherapy; Received Radiation Past Medical History Notes: breast cancer Immunizations Pneumococcal Vaccine: Received Pneumococcal Vaccination: No Implantable Devices None Family and Social History Never smoker Electronic Signature(s) Signed: 06/28/2022 12:03:59 PM By: Kalman Shan DO Signed: 06/28/2022 3:19:57 PM By: Levora Dredge Entered By: Kalman Shan on 06/28/2022 12:03:15 Laura Santiago ([id number]) -------------------------------------------------------------------------------- SuperBill Details Patient Name: Laura Santiago. Date of Service: 06/28/2022 Medical Record Number: [id number] Patient Account Number: [id number] Date of Birth/Sex: Oct 09, 1946 (76 y.o. F) Treating RN: Levora Dredge Primary Care Provider: [facility] [place], [place] Other Clinician: [name] Referring Provider: [facility] Treating Provider/Extender: Yaakov Guthrie in Treatment: 51 Diagnosis Coding ICD-10 Codes Code Description [phone] 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 I87.2 Venous insufficiency (chronic) (peripheral) Z79.01 Long term (current) use of anticoagulants I10 Essential (primary) hypertension 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 Facility Procedures CPT4 Code: 75883254 Description: (Facility Use Only) [place] Modifier: Quantity: 1 CPT4 Code: 98264158 Description: 30940 - DEB SUBQ TISSUE 20 SQ CM/< Modifier: Quantity: 1 CPT4 Code: Description: ICD-10 Diagnosis Description L97.822 Non-pressure chronic ulcer of other part of left lower  leg with fat layer ex 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 Modifier: posed Quantity: Physician Procedures CPT4 Code: 7680881 Description: 11042 - WC PHYS SUBQ TISS 20 SQ CM Modifier: Quantity: 1 CPT4 Code: Description: ICD-10 Diagnosis Description L97.822 Non-pressure chronic ulcer of other part of left lower leg with fat layer I87.312 Chronic venous hypertension (idiopathic) with ulcer of left lower extremi Modifier: exposed ty Quantity: Electronic Signature(s) Signed: 06/28/2022 12:03:59 PM By: Kalman Shan DO Entered By: Kalman Shan on 06/28/2022 11:56:38

## 2022-06-29 ENCOUNTER — Ambulatory Visit: Payer: Medicare Other

## 2022-07-01 ENCOUNTER — Other Ambulatory Visit: Payer: Self-pay | Admitting: Oncology

## 2022-07-03 ENCOUNTER — Encounter: Payer: Self-pay | Admitting: Oncology

## 2022-07-03 ENCOUNTER — Telehealth: Payer: Self-pay | Admitting: *Deleted

## 2022-07-03 MED ORDER — OXYCODONE HCL 10 MG PO TABS: 15.0000 mg | ORAL_TABLET | Freq: Four times a day (QID) | ORAL | 0 refills | Status: DC | PRN

## 2022-07-03 MED ORDER — OXYCODONE HCL 15 MG PO TABS: 15.0000 mg | ORAL_TABLET | Freq: Four times a day (QID) | ORAL | 0 refills | Status: DC | PRN

## 2022-07-03 NOTE — Telephone Encounter (Signed)
Pharmacy called stating that they ddo not have Oxycodone 15 mg in stock and stats that they do have 10 g tbs if you want to order that

## 2022-07-05 ENCOUNTER — Ambulatory Visit: Payer: Medicare Other | Admitting: Internal Medicine

## 2022-07-06 ENCOUNTER — Ambulatory Visit
Admission: RE | Admit: 2022-07-06 | Discharge: 2022-07-06 | Disposition: A | Discharge status: Home / Self Care | Payer: Medicare Other | Source: Ambulatory Visit | Attending: Oncology | Admitting: Oncology

## 2022-07-06 DIAGNOSIS — I080 Rheumatic disorders of both mitral and aortic valves: Secondary | ICD-10-CM | POA: Diagnosis not present

## 2022-07-06 DIAGNOSIS — Z86718 Personal history of other venous thrombosis and embolism: Secondary | ICD-10-CM | POA: Diagnosis not present

## 2022-07-06 DIAGNOSIS — I10 Essential (primary) hypertension: Secondary | ICD-10-CM | POA: Diagnosis not present

## 2022-07-06 DIAGNOSIS — C50919 Malignant neoplasm of unspecified site of unspecified female breast: Secondary | ICD-10-CM | POA: Diagnosis present

## 2022-07-06 DIAGNOSIS — Z79899 Other long term (current) drug therapy: Secondary | ICD-10-CM | POA: Diagnosis not present

## 2022-07-06 DIAGNOSIS — Z0189 Encounter for other specified special examinations: Secondary | ICD-10-CM

## 2022-07-06 LAB — ECHOCARDIOGRAM COMPLETE
AR max vel: 2.43 cm2
AV Area VTI: 2.84 cm2
AV Area mean vel: 2.53 cm2
AV Mean grad: 5 mmHg
AV Peak grad: 9 mmHg
Ao pk vel: 1.5 m/s
Area-P 1/2: 3.72 cm2
S' Lateral: 3.3 cm

## 2022-07-06 NOTE — Progress Notes (Signed)
*  PRELIMINARY RESULTS* Echocardiogram 2D Echocardiogram has been performed.  Sherrie Sport 07/06/2022, 11:05 AM

## 2022-07-12 ENCOUNTER — Encounter: Payer: Medicare Other | Attending: Internal Medicine | Admitting: Internal Medicine

## 2022-07-12 DIAGNOSIS — I129 Hypertensive chronic kidney disease with stage 1 through stage 4 chronic kidney disease, or unspecified chronic kidney disease: Secondary | ICD-10-CM | POA: Insufficient documentation

## 2022-07-12 DIAGNOSIS — Z7901 Long term (current) use of anticoagulants: Secondary | ICD-10-CM | POA: Insufficient documentation

## 2022-07-12 DIAGNOSIS — Z86718 Personal history of other venous thrombosis and embolism: Secondary | ICD-10-CM | POA: Insufficient documentation

## 2022-07-12 DIAGNOSIS — L97822 Non-pressure chronic ulcer of other part of left lower leg with fat layer exposed: Secondary | ICD-10-CM | POA: Diagnosis not present

## 2022-07-12 DIAGNOSIS — I87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: Secondary | ICD-10-CM | POA: Insufficient documentation

## 2022-07-12 DIAGNOSIS — N184 Chronic kidney disease, stage 4 (severe): Secondary | ICD-10-CM | POA: Insufficient documentation

## 2022-07-12 DIAGNOSIS — I872 Venous insufficiency (chronic) (peripheral): Secondary | ICD-10-CM | POA: Insufficient documentation

## 2022-07-12 DIAGNOSIS — Z853 Personal history of malignant neoplasm of breast: Secondary | ICD-10-CM | POA: Diagnosis not present

## 2022-07-14 ENCOUNTER — Other Ambulatory Visit: Payer: Self-pay | Admitting: *Deleted

## 2022-07-14 ENCOUNTER — Other Ambulatory Visit: Payer: Self-pay | Admitting: Oncology

## 2022-07-14 MED ORDER — FENTANYL 25 MCG/HR TD PT72
MEDICATED_PATCH | TRANSDERMAL | 0 refills | Status: DC
Start: 2022-07-14 — End: 2022-08-29

## 2022-07-14 NOTE — Progress Notes (Signed)
Laura, Santiago ([id number]) Visit Report for 07/12/2022 Chief Complaint Document Details Patient Name: Laura Santiago, Laura Santiago. Date of Service: 07/12/2022 10:30 AM Medical Record Number: [id number] Patient Account Number: [id number] Date of Birth/Sex: Feb 09, 1946 (76 y.o. F) Treating RN: Cornell Barman Primary Care Provider: Hilda Lias, [place] Other Clinician: [name] Referring Provider: [facility] Treating Provider/Extender: Yaakov Guthrie in Treatment: 33 Information Obtained from: Patient Chief Complaint Left lower extremity wound Right toe wounds Left upper lateral thigh wounds Electronic Signature(s) Signed: 07/12/2022 1:00:58 PM By: Kalman Shan DO Entered By: Kalman Shan on 07/12/2022 12:18:10 Laura Santiago ([id number]) -------------------------------------------------------------------------------- Debridement Details Patient Name: Laura Santiago. Date of Service: 07/12/2022 10:30 AM Medical Record Number: [id number] Patient Account Number: [id number] Date of Birth/Sex: 1946-09-28 (76 y.o. F) Treating RN: Cornell Barman Primary Care Provider: Hilda Lias, [place] Other Clinician: [name] Referring Provider: [facility] Treating Provider/Extender: Yaakov Guthrie in Treatment: 53 Debridement Performed for Wound #1 Left,Medial Lower Leg Assessment: Performed By: Physician Kalman Shan, MD Debridement Type: Debridement Severity of Tissue Pre Debridement: Fat layer exposed Level of Consciousness (Pre- Awake and Alert procedure): Pre-procedure Verification/Time Out Yes - 11:46 Taken: Start Time: 11:46 Total Area Debrided (L x W): 9 (cm) x 15 (cm) = 135 (cm) Tissue and other material Viable, Non-Viable, Slough, Subcutaneous, Slough debrided: Level: Skin/Subcutaneous Tissue Debridement Description: Excisional Instrument: Curette Bleeding: Minimum Hemostasis Achieved: Pressure End Time: 11:48 Response to  Treatment: Procedure was tolerated well Level of Consciousness (Post- Awake and Alert procedure): Post Debridement Measurements of Total Wound Length: (cm) 9.6 Width: (cm) 15 Depth: (cm) 0.2 Volume: (cm) 22.619 Character of Wound/Ulcer Post Debridement: Stable Severity of Tissue Post Debridement: Fat layer exposed Post Procedure Diagnosis Same as Pre-procedure Electronic Signature(s) Signed: 07/12/2022 1:00:58 PM By: Kalman Shan DO Signed: 07/12/2022 4:00:18 PM By: [name] Signed: 07/14/2022 3:39:08 PM By: Gretta Cool, BSN, RN, CWS, [name] RN, BSN Entered By: [name] on 07/12/2022 11:47:44 KARIMAH, WINQUIST ([id number]) -------------------------------------------------------------------------------- HPI Details Patient Name: Laura, Santiago. Date of Service: 07/12/2022 10:30 AM Medical Record Number: [id number] Patient Account Number: [id number] Date of Birth/Sex: [birth year] (76 y.o. F) Treating RN: Cornell Barman Primary Care Provider: Hilda Lias, [place] Other Clinician: [name] Referring Provider: [facility] Treating Provider/Extender: Yaakov Guthrie in Treatment: 52 History of Present Illness HPI Description: Admission 7/27 Ms. Deidra Spease is a 76 year old female with a past medical history of ADHD, metastatic breast cancer, stage IV chronic kidney disease, history of DVT on Xarelto and chronic venous insufficiency that presents to the clinic for a chronic left lower extremity wound. She recently moved to [facility] 4 days ago. She was being followed by wound care center in [name]. She reports a 10-year history of wounds to her left lower extremity that eventually do heal with debridement and compression therapy. She states that the current wound reopened 4 months ago and she is using Vaseline and Coban. She denies signs of infection. 8/3; patient presents for 1 week follow-up. She reports no issues or complaints today. She states she  had vascular studies done in the last week. She denies signs of infection. She brought her little service dog with her today. 8/17; patient presents for follow-up. She has missed her last clinic appointment. She states she took the wrap off and attempted to rewrap her leg. She is having difficulty with transportation. She has her service dog with her today. Overall she feels well and reports improvement in wound healing. She denies signs  of infection. She reports owning an old Velcro wrap compression and has this at her living facility 9/14; patient presents for follow-up. Patient states that over the past 2 to 3 weeks she developed toe wounds to her right foot. She attributes this to tight fitting shoes. She subsequently developed cellulitis in the right leg and has been treated by doxycycline by her oncologist. She reports improvement in symptoms however continues to have some redness and swelling to this leg. To the left lower extremity patient has been having her wraps changed with home health twice weekly. She states that the [facility] is not helping control the drainage. Other than that she has no issues or complaints today. She denies signs of infection to the left lower extremity. 9/21; patient presents for follow-up. She reports seeing infectious disease for her cellulitis. She reports no further management. She has home health that changes the wraps twice weekly. She has no issues or complaints today. She denies signs of infection. 10/5; patient presents for follow-up. She has no issues or complaints today. She denies signs of infection. She states that the right great toe has not been dressed by home health. 10/12; patient presents for follow-up. She has no issues or complaints today. She reports improvement in her wound healing. She has been using silver alginate to the right great toe wound. She denies signs of infection. 10/26; patient presents for follow-up. Home health did not  have sorbact so they continued to use Hydrofera Blue under the wrap. She has been using silver alginate to the great toe wound however she did not have a dressing in place today. She currently denies signs of infection. 11/2; patient presents for follow-up. She has been using sorb act under the compression wrap. She reports using silver alginate to the toe wound again she does not have a dressing in place. She currently denies signs of infection. 11/23; patient presents for follow-up. Unfortunately she has missed her last 2 clinic appointments. She was last seen 3 weeks ago. She did her own compression wrap with Kerlix and Coban yesterday after seeing vein and vascular. She has not been dressing her right great toe wound. She currently denies signs of infection. 11/30; patient presents for 1 week follow-up. She states she changed her dressing last week prior to home health and use sorb act with Dakin's and Hydrofera Blue. Home health has changed the dressing as well and they have been using sorbact. Today she reports increased redness to her right lower extremity. She has a history of cellulitis to this leg. She has been using silver alginate to the right great toe. Unfortunately she had an episode of diarrhea prior to coming in and had feces all over the right leg and to the wrap of her left leg. 12/7; patient presents for 1 week follow-up. She states that home health did not come out to change the dressing and she took it off yesterday. It is unclear if she is dressing the right toe wound. She denies signs of infection. 12/14; patient presents for 1 week follow-up. She has no issues or complaints today. 12/21; patient presents for follow-up. She has no issues or complaints today. She denies signs of infection. 12/28/2021; patient presents for follow-up. She was hospitalized for sepsis secondary to right lower extremity cellulitis On 12/23. She states she is currently at a SNF. She states that she  was started on doxycycline this morning for her right great toe swelling and redness. She is not sure what dressings  have been done to her left lower extremity for the past 3 weeks. She says its been mainly gauze with an Ace wrap. 1/25; patient presents for follow-up. She is still residing in a skilled nursing facility. She reports mild pain to the left lower extremity wound bed. She states she is going to see a podiatrist soon. 2/8; patient presents for follow-up. She has moved back to her residential community from her skilled nursing facility. She has no issues or complaints today. She denies signs of systemic infections. 2/15; patient presents for follow-up. He has no issues or complaints today. She denies systemic signs of infection. 2/22; patient presents for follow-up. She has no issues or complaints today. She denies signs of infection. SHYLEIGH, DAUGHTRY ([id number]) 3/1; patient presents for follow-up. She states that home health came out the day after she was seen in our clinic and yesterday to do the wrap change. She denies signs of infection. She reports excoriated skin on the ankle. 3/8; patient presents for follow-up. She has no issues or complaints today. She denies signs of infection. 3/15; patient presents for follow-up. Home health has been coming out to change the dressings. She reports more tenderness to the wound site. She denies purulent drainage, increased warmth or erythema to the area. 4/5; patient presents for follow-up. She has missed her last 2 clinic appointments. I have not seen her in 3 weeks. She was recently hospitalized for altered mental status. She was involuntarily committed. She was evaluated by psychiatry and deemed to have competency. There was no specific cause of her altered mental status. It was concluded that her physical and mental health were declining due to her chronic medical conditions. Currently home health has been coming out for dressing changes.  Patient has also been doing her own dressing changes. She reports more skin breakdown to the periwound and now has a new wound. She denies fever/chills. She reports continued tenderness to the wound site. 4/12; patient with significant venous insufficiency and a large wound on her left lower leg taking up about 80% of the circumference of her lower leg. Cultures of this grew MRSA and Pseudomonas. She had completed a course of ciprofloxacin now is starting doxycycline. She has been using Dakin's wet-to-dry and a Tubigrip. She has home health twice a week and we change it once. 4/19; patient presents for follow-up. She completed her course of doxycycline. She has been using Dakin's wet-to-dry dressing and Tubigrip. Home health changes the dressing twice weekly. Currently she has no issues or complaints. 4/26; patient presents for follow-up. At last clinic visit orders for home health were Iodosorb under compression therapy. Unfortunately they did not have the dressing and have been using Dakin's and gentamicin under the wrap. Patient currently denies signs of infection. She has no issues or complaints today. 5/3; patient presents for follow-up. Again Iodosorb has not been used under the compression therapy when home health comes out to change the wrap and dressing. They have been using Sorbact. It is unclear why this is happening since we send orders weekly to the agency. She denies signs of infection. Patient has not purchased the [facility] antibiotics. We reached out to the company and they said they have been trying to contact her on a regular basis. We gave the patient the number to call to order the medication. 5/10; patient presents for follow-up. She has no issues or complaints today. Again home health has not been using Iodosorb. Mepilex was on the wound bed. No other dressings  noted. She brought in her [facility] antibiotics. She denies signs of infection. 5/17; patient presents for follow-up.  Home health has come out twice since she was last seen. Joint well she has been using Keystone antibiotic with Sorbact under the compression wrap. She has no issues or complaints today. She denies signs of infection. 5/24; patient presents for follow-up. We have been using Keystone antibiotics with Sorbact under compression therapy. She is tolerating the treatment well. She is reporting improvement in wound healing. She denies signs of infection. 5/31; patient presents for follow-up. We continue to do [facility] antibiotics with Sorbact under compression therapy. She continues to report improvement in wound healing. Home health comes out and changes the dressing once weekly. 05-17-2022 upon evaluation today patient appears to be doing better in regard to her wound especially compared to the last time I saw her. Fortunately I do think that she is seeing improvements. With that being said I do believe that she may be benefit from sharp debridement today to clear away some of the necrotic debris I discussed that with her as well. She is an amendable to that plan. Otherwise she is very pleased with how the [employer] is doing for her. 6/14; patient presents for follow-up. We have been using Keystone antibiotic with Sorbact and absorbent dressings under 3 layer compression. She has no issues or complaints today. She reports improvement in wound healing. She denies signs of infection. 6/21; patient presents for follow-up. We are continuing with [facility] antibiotic and Sorbact under 3 layer compression. Patient has no complaints. Continued wound healing is happening. She denies signs of infection. 6/28; patient presents for follow-up. We have been using Keystone antibiotic with Sorbact under 3 layer compression. Usually home health comes out and changes the dressing twice a week. Unfortunately they did not go out to change the dressing. It is unclear why. Patient did not call them. She currently denies signs of  infection. 7/5; patient presents for follow-up. We have been using Keystone antibiotic with calcium alginate under 3 layer compression. She reports improvement in wound healing. She denies signs of infection. Home health has come out to do dressing changes twice this past week. 7/12; patient presents for follow-up. We have been using Keystone antibiotic with calcium alginate under 3 layer compression. Patient states that home health came out once last week to change the dressing. She reports improvement in wound healing. She currently denies signs of infection. 7/19; patient presents for follow-up. We have been using Keystone antibiotic with calcium alginate under 3 layer compression. Home health came out once last week to change the dressing. She has no issues or complaints today. She denies signs of infection. 8/2; patient presents for follow-up. We have been using Keystone antibiotic with calcium alginate under 3 layer compression. Unfortunately she missed her appointment last week and home health did not come out to do dressing changes. Patient currently denies signs of infection. Electronic Signature(s) Signed: 07/12/2022 1:00:58 PM By: Kalman Shan DO Entered By: Kalman Shan on 07/12/2022 12:36:11 Laura Santiago ([id number]) -------------------------------------------------------------------------------- Physical Exam Details Patient Name: TONIYAH, DILMORE. Date of Service: 07/12/2022 10:30 AM Medical Record Number: [id number] Patient Account Number: [id number] Date of Birth/Sex: Apr 02, 1946 (76 y.o. F) Treating RN: Cornell Barman Primary Care Provider: Hilda Lias, [place] Other Clinician: [name] Referring Provider: [facility] Treating Provider/Extender: Yaakov Guthrie in Treatment: 71 Constitutional . Cardiovascular . Psychiatric . Notes Left lower extremity: Large open wound with granulation tissue and nonviable tissue. No signs of  surrounding soft  tissue infection. Electronic Signature(s) Signed: 07/12/2022 1:00:58 PM By: Kalman Shan DO Entered By: Kalman Shan on 07/12/2022 12:36:53 Laura Santiago ([id number]) -------------------------------------------------------------------------------- Physician Orders Details Patient Name: Laura Santiago. Date of Service: 07/12/2022 10:30 AM Medical Record Number: [id number] Patient Account Number: [id number] Date of Birth/Sex: 1946/11/17 (76 y.o. F) Treating RN: Cornell Barman Primary Care Provider: Hilda Lias, [place] Other Clinician: [name] Referring Provider: [facility] Treating Provider/Extender: Yaakov Guthrie in Treatment: 106 Verbal / Phone Orders: No Diagnosis Coding Follow-up Appointments o Return Appointment in 1 week. o Nurse Visit as needed [place] for wound care. May utilize formulary equivalent dressing for wound treatment orders unless otherwise specified. Home Health Nurse may visit PRN to address patientos wound care needs. o **Please direct any NON-WOUND related issues/requests for orders to patient's Primary Care Physician. **If current dressing causes regression in wound condition, may D/C ordered dressing product/s and apply Normal Saline Moist Dressing daily until next [place] or Other MD appointment. **Notify Wound Healing Center of regression in wound condition at [phone]. o Other Home Health Orders/Instructions: - Dressing change 3 x weekly, once by wound care and once at wound clinic weekly. PLEASE make sure frequency between dressing changes is appropriate. Bathing/ Shower/ Hygiene o May shower with wound dressing protected with water repellent cover or cast protector. o No tub bath. Anesthetic (Use 'Patient Medications' Section for Anesthetic Order Entry) o Lidocaine applied to wound bed Edema Control - Lymphedema / Segmental  Compressive Device / Other o Optional: One layer of unna paste to top of compression wrap (to act as an anchor). - PLEASE when applying wrap start from toes and go up to just below knee o Elevate, Exercise Daily and Avoid Standing for Long Periods of Time. o Elevate legs to the level of the heart and pump ankles as often as possible o Elevate leg(s) parallel to the floor when sitting. o DO YOUR BEST to sleep in the bed at night. DO NOT sleep in your recliner. Long hours of sitting in a recliner leads to swelling of the legs and/or potential wounds on your backside. Additional Orders / Instructions o Follow Nutritious Diet and Increase Protein Intake Wound Treatment Wound #1 - Lower Leg Wound Laterality: Left, Medial Cleanser: Wound Cleanser (Home Health) 3 x Per Week/30 Days Discharge Instructions: Wash your hands with soap and water. Remove old dressing, discard into plastic bag and place into trash. Cleanse the wound with Wound Cleanser prior to applying a clean dressing using gauze sponges, not tissues or cotton balls. Do not scrub or use excessive force. Pat dry using gauze sponges, not tissue or cotton balls. Peri-Wound Care: Nystatin Cream USP 30 (g) 3 x Per Week/30 Days Discharge Instructions: Use Nystatin Cream as directed. MIx 1:1 with TCA cream and applied to peri wound Peri-Wound Care: Triamcinolone Acetonide Cream, 0.1%, 15 (g) tube 3 x Per Week/30 Days Discharge Instructions: Apply as directed. mix 1:1 with nystatin cream for peri wound Primary Dressing: Aquacel Extra Hydrofiber Dressing, 4x5 (in/in) 3 x Per Week/30 Days Primary Dressing: keystone compound 3 x Per Week/30 Days Discharge Instructions: apply to wound bed as directed on medication label PLEASE follow direction on pill bottle for proper mixing Secondary Dressing: Zetuvit Absorbent Pad, 4x8 (in/in) 3 x Per Week/30 Days Compression Wrap: 3-LAYER WRAP - Profore Lite LF 3 Multilayer Compression Bandaging  System 3 x Per Week/30 Days Discharge Instructions: Apply  3 multi-layer wrap as prescribed. SUMMER, MCCOLGAN ([id number]) Electronic Signature(s) Signed: 07/12/2022 1:00:58 PM By: Kalman Shan DO Entered By: Kalman Shan on 07/12/2022 12:41:45 ANAJAH, STERBENZ ([id number]) -------------------------------------------------------------------------------- Problem List Details Patient Name: LARYA, CHARPENTIER. Date of Service: 07/12/2022 10:30 AM Medical Record Number: [id number] Patient Account Number: [id number] Date of Birth/Sex: [birth year] (76 y.o. F) Treating RN: Cornell Barman Primary Care Provider: Hilda Lias, [place] Other Clinician: [name] Referring Provider: [facility] Treating Provider/Extender: Yaakov Guthrie in Treatment: 35 Active Problems ICD-10 Encounter Code Description Active Date MDM Diagnosis L97.822 Non-pressure chronic ulcer of other part of left lower leg with fat layer 11/02/2021 No Yes exposed I87.312 Chronic venous hypertension (idiopathic) with ulcer of left lower 11/02/2021 No Yes extremity I87.2 Venous insufficiency (chronic) (peripheral) 07/06/2021 No Yes Z79.01 Long term (current) use of anticoagulants 07/06/2021 No Yes I10 Essential (primary) hypertension 07/06/2021 No Yes 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 07/06/2021 No Yes Inactive Problems ICD-10 Code Description Active Date Inactive Date S81.802A Unspecified open wound, left lower leg, initial encounter 07/06/2021 07/06/2021 S91.101A Unspecified open wound of right great toe without damage to nail, initial 08/24/2021 08/24/2021 encounter S91.104A Unspecified open wound of right lesser toe(s) without damage to nail, initial 08/24/2021 08/24/2021 encounter Resolved Problems ICD-10 Code Description Active Date Resolved Date S91.104D Unspecified open wound of right lesser toe(s) without damage to nail, 08/31/2021 08/31/2021 subsequent encounter SIBLEY, ROLISON  ([id number]) S91.201D Unspecified open wound of right great toe with damage to nail, subsequent 08/31/2021 08/31/2021 encounter Electronic Signature(s) Signed: 07/12/2022 1:00:58 PM By: Kalman Shan DO Entered By: Kalman Shan on 07/12/2022 12:18:06 Laura Santiago ([id number]) -------------------------------------------------------------------------------- Progress Note Details Patient Name: Laura Santiago. Date of Service: 07/12/2022 10:30 AM Medical Record Number: [id number] Patient Account Number: [id number] Date of Birth/Sex: 10-12-1946 (76 y.o. F) Treating RN: Cornell Barman Primary Care Provider: [facility], [place] Other Clinician: [name] Referring Provider: [facility] Treating Provider/Extender: Yaakov Guthrie in Treatment: 90 Subjective Chief Complaint Information obtained from Patient Left lower extremity wound Right toe wounds Left upper lateral thigh wounds History of Present Illness (HPI) Admission 7/27 Ms. Alla Sloma is a 76 year old female with a past medical history of ADHD, metastatic breast cancer, stage IV chronic kidney disease, history of DVT on Xarelto and chronic venous insufficiency that presents to the clinic for a chronic left lower extremity wound. She recently moved to [facility] 4 days ago. She was being followed by wound care center in [name]. She reports a 10-year history of wounds to her left lower extremity that eventually do heal with debridement and compression therapy. She states that the current wound reopened 4 months ago and she is using Vaseline and Coban. She denies signs of infection. 8/3; patient presents for 1 week follow-up. She reports no issues or complaints today. She states she had vascular studies done in the last week. She denies signs of infection. She brought her little service dog with her today. 8/17; patient presents for follow-up. She has missed her last clinic appointment. She states  she took the wrap off and attempted to rewrap her leg. She is having difficulty with transportation. She has her service dog with her today. Overall she feels well and reports improvement in wound healing. She denies signs of infection. She reports owning an old Velcro wrap compression and has this at her living facility 9/14; patient presents for follow-up. Patient states that over the past 2 to 3 weeks she developed toe wounds to  her right foot. She attributes this to tight fitting shoes. She subsequently developed cellulitis in the right leg and has been treated by doxycycline by her oncologist. She reports improvement in symptoms however continues to have some redness and swelling to this leg. To the left lower extremity patient has been having her wraps changed with home health twice weekly. She states that the [facility] is not helping control the drainage. Other than that she has no issues or complaints today. She denies signs of infection to the left lower extremity. 9/21; patient presents for follow-up. She reports seeing infectious disease for her cellulitis. She reports no further management. She has home health that changes the wraps twice weekly. She has no issues or complaints today. She denies signs of infection. 10/5; patient presents for follow-up. She has no issues or complaints today. She denies signs of infection. She states that the right great toe has not been dressed by home health. 10/12; patient presents for follow-up. She has no issues or complaints today. She reports improvement in her wound healing. She has been using silver alginate to the right great toe wound. She denies signs of infection. 10/26; patient presents for follow-up. Home health did not have sorbact so they continued to use Hydrofera Blue under the wrap. She has been using silver alginate to the great toe wound however she did not have a dressing in place today. She currently denies signs of  infection. 11/2; patient presents for follow-up. She has been using sorb act under the compression wrap. She reports using silver alginate to the toe wound again she does not have a dressing in place. She currently denies signs of infection. 11/23; patient presents for follow-up. Unfortunately she has missed her last 2 clinic appointments. She was last seen 3 weeks ago. She did her own compression wrap with Kerlix and Coban yesterday after seeing vein and vascular. She has not been dressing her right great toe wound. She currently denies signs of infection. 11/30; patient presents for 1 week follow-up. She states she changed her dressing last week prior to home health and use sorb act with Dakin's and Hydrofera Blue. Home health has changed the dressing as well and they have been using sorbact. Today she reports increased redness to her right lower extremity. She has a history of cellulitis to this leg. She has been using silver alginate to the right great toe. Unfortunately she had an episode of diarrhea prior to coming in and had feces all over the right leg and to the wrap of her left leg. 12/7; patient presents for 1 week follow-up. She states that home health did not come out to change the dressing and she took it off yesterday. It is unclear if she is dressing the right toe wound. She denies signs of infection. 12/14; patient presents for 1 week follow-up. She has no issues or complaints today. 12/21; patient presents for follow-up. She has no issues or complaints today. She denies signs of infection. 12/28/2021; patient presents for follow-up. She was hospitalized for sepsis secondary to right lower extremity cellulitis On 12/23. She states she is currently at a SNF. She states that she was started on doxycycline this morning for her right great toe swelling and redness. She is not sure what dressings have been done to her left lower extremity for the past 3 weeks. She says its been mainly  gauze with an Ace wrap. 1/25; patient presents for follow-up. She is still residing in a skilled nursing facility.  She reports mild pain to the left lower extremity wound bed. She states she is going to see a podiatrist soon. 2/8; patient presents for follow-up. She has moved back to her residential community from her skilled nursing facility. She has no issues or complaints today. She denies signs of systemic infections. CAPRICIA, SERDA ([id number]) 2/15; patient presents for follow-up. He has no issues or complaints today. She denies systemic signs of infection. 2/22; patient presents for follow-up. She has no issues or complaints today. She denies signs of infection. 3/1; patient presents for follow-up. She states that home health came out the day after she was seen in our clinic and yesterday to do the wrap change. She denies signs of infection. She reports excoriated skin on the ankle. 3/8; patient presents for follow-up. She has no issues or complaints today. She denies signs of infection. 3/15; patient presents for follow-up. Home health has been coming out to change the dressings. She reports more tenderness to the wound site. She denies purulent drainage, increased warmth or erythema to the area. 4/5; patient presents for follow-up. She has missed her last 2 clinic appointments. I have not seen her in 3 weeks. She was recently hospitalized for altered mental status. She was involuntarily committed. She was evaluated by psychiatry and deemed to have competency. There was no specific cause of her altered mental status. It was concluded that her physical and mental health were declining due to her chronic medical conditions. Currently home health has been coming out for dressing changes. Patient has also been doing her own dressing changes. She reports more skin breakdown to the periwound and now has a new wound. She denies fever/chills. She reports continued tenderness to the wound  site. 4/12; patient with significant venous insufficiency and a large wound on her left lower leg taking up about 80% of the circumference of her lower leg. Cultures of this grew MRSA and Pseudomonas. She had completed a course of ciprofloxacin now is starting doxycycline. She has been using Dakin's wet-to-dry and a Tubigrip. She has home health twice a week and we change it once. 4/19; patient presents for follow-up. She completed her course of doxycycline. She has been using Dakin's wet-to-dry dressing and Tubigrip. Home health changes the dressing twice weekly. Currently she has no issues or complaints. 4/26; patient presents for follow-up. At last clinic visit orders for home health were Iodosorb under compression therapy. Unfortunately they did not have the dressing and have been using Dakin's and gentamicin under the wrap. Patient currently denies signs of infection. She has no issues or complaints today. 5/3; patient presents for follow-up. Again Iodosorb has not been used under the compression therapy when home health comes out to change the wrap and dressing. They have been using Sorbact. It is unclear why this is happening since we send orders weekly to the agency. She denies signs of infection. Patient has not purchased the [facility] antibiotics. We reached out to the company and they said they have been trying to contact her on a regular basis. We gave the patient the number to call to order the medication. 5/10; patient presents for follow-up. She has no issues or complaints today. Again home health has not been using Iodosorb. Mepilex was on the wound bed. No other dressings noted. She brought in her Keystone antibiotics. She denies signs of infection. 5/17; patient presents for follow-up. Home health has come out twice since she was last seen. Joint well she has been using Keystone antibiotic with  Sorbact under the compression wrap. She has no issues or complaints today. She denies  signs of infection. 5/24; patient presents for follow-up. We have been using Keystone antibiotics with Sorbact under compression therapy. She is tolerating the treatment well. She is reporting improvement in wound healing. She denies signs of infection. 5/31; patient presents for follow-up. We continue to do [facility] antibiotics with Sorbact under compression therapy. She continues to report improvement in wound healing. Home health comes out and changes the dressing once weekly. 05-17-2022 upon evaluation today patient appears to be doing better in regard to her wound especially compared to the last time I saw her. Fortunately I do think that she is seeing improvements. With that being said I do believe that she may be benefit from sharp debridement today to clear away some of the necrotic debris I discussed that with her as well. She is an amendable to that plan. Otherwise she is very pleased with how the [employer] is doing for her. 6/14; patient presents for follow-up. We have been using Keystone antibiotic with Sorbact and absorbent dressings under 3 layer compression. She has no issues or complaints today. She reports improvement in wound healing. She denies signs of infection. 6/21; patient presents for follow-up. We are continuing with [facility] antibiotic and Sorbact under 3 layer compression. Patient has no complaints. Continued wound healing is happening. She denies signs of infection. 6/28; patient presents for follow-up. We have been using Keystone antibiotic with Sorbact under 3 layer compression. Usually home health comes out and changes the dressing twice a week. Unfortunately they did not go out to change the dressing. It is unclear why. Patient did not call them. She currently denies signs of infection. 7/5; patient presents for follow-up. We have been using Keystone antibiotic with calcium alginate under 3 layer compression. She reports improvement in wound healing. She denies signs of  infection. Home health has come out to do dressing changes twice this past week. 7/12; patient presents for follow-up. We have been using Keystone antibiotic with calcium alginate under 3 layer compression. Patient states that home health came out once last week to change the dressing. She reports improvement in wound healing. She currently denies signs of infection. 7/19; patient presents for follow-up. We have been using Keystone antibiotic with calcium alginate under 3 layer compression. Home health came out once last week to change the dressing. She has no issues or complaints today. She denies signs of infection. 8/2; patient presents for follow-up. We have been using Keystone antibiotic with calcium alginate under 3 layer compression. Unfortunately she missed her appointment last week and home health did not come out to do dressing changes. Patient currently denies signs of infection. LASHARN, BUFKIN ([id number]) Objective Constitutional Vitals Time Taken: 11:26 AM, Height: 66 in, Weight: 153 lbs, BMI: 24.7, Temperature: 98.0 F, Pulse: 66 bpm, Respiratory Rate: 18 breaths/min, Blood Pressure: 144/73 mmHg. General Notes: Left lower extremity: Large open wound with granulation tissue and nonviable tissue. No signs of surrounding soft tissue infection. Integumentary (Hair, Skin) Wound #1 status is Open. Original cause of wound was Gradually Appeared. The date acquired was: 04/06/2021. The wound has been in treatment 53 weeks. The wound is located on the Left,Medial Lower Leg. The wound measures 9.6cm length x 15cm width x 0.2cm depth; 113.097cm^2 area and 22.619cm^3 volume. There is Fat Layer (Subcutaneous Tissue) exposed. There is a medium amount of serosanguineous drainage noted. The wound margin is flat and intact. There is medium (34-66%) red, pink granulation  within the wound bed. There is a medium (34-66%) amount of necrotic tissue within the wound bed including Adherent  Slough. 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 Unfortunately she has had the wrap on for 2 weeks without having this changed. She has been advised to not leave the wrap on for more than a week. It is unclear if she reached out to home health to have this changed. She missed her last clinic appointment. I debrided nonviable tissue. luckily no signs of surrounding soft tissue infection. I recommended continuing the course with [facility] antibiotic and calcium alginate under 3 layer compression. I highly advised that she contact our office next time she misses to have the wrap change. We will reach out to home health to see how well we can best facilitate communication between the patient and the agency for wound care. 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 There was a Excisional Skin/Subcutaneous Tissue Debridement with a total area of 135 sq cm performed by Kalman Shan, MD. With the following instrument(s): Curette to remove Viable and Non-Viable tissue/material. Material removed includes Subcutaneous Tissue and Slough and. A time out was conducted at 11:46, prior to the start of the procedure. A Minimum amount of bleeding was controlled with Pressure. The procedure was tolerated well. Post Debridement Measurements: 9.6cm length x 15cm width x 0.2cm depth; 22.619cm^3 volume. Character of Wound/Ulcer Post Debridement is stable. Severity of Tissue Post Debridement is: Fat layer exposed. Post procedure Diagnosis Wound #1:  Same as Pre-Procedure Pre-procedure diagnosis of Wound #1 is a Venous Leg Ulcer located on the Left,Medial Lower Leg . There was a Three Layer Compression Therapy Procedure with a pre-treatment ABI of 1.5 by [name]. Post procedure Diagnosis Wound #1: Same as Pre-Procedure Plan Follow-up Appointments: Return Appointment in 1 week. Nurse Visit as needed TIFFANEE, MCNEE ([id number]) Home Health: [facility] for wound care. May utilize formulary equivalent dressing for wound treatment orders unless otherwise specified. Home Health Nurse may visit PRN to address patient s wound care needs. **Please direct any NON-WOUND related issues/requests for orders to patient's Primary Care Physician. **If current dressing causes regression in wound condition, may D/C ordered dressing product/s and apply Normal Saline Moist Dressing daily until next [place] or Other MD appointment. **Notify [facility] of regression in wound condition at [phone]. Other Home Health Orders/Instructions: - Dressing change 3 x weekly, once by wound care and once at wound clinic weekly. PLEASE make sure frequency between dressing changes is appropriate. Bathing/ Shower/ Hygiene: May shower with wound dressing protected with water repellent cover or cast protector. No tub bath. Anesthetic (Use 'Patient Medications' Section for Anesthetic Order Entry): Lidocaine applied to wound bed Edema Control - Lymphedema / Segmental Compressive Device / Other: Optional: One layer of unna paste to top of compression wrap (to act as an anchor). - PLEASE when applying wrap start from toes and go up to just below knee Elevate, Exercise Daily and Avoid Standing for Long Periods of Time. Elevate legs to the level of the heart and pump ankles as often as possible  Elevate leg(s) parallel to the floor when sitting. DO YOUR BEST to sleep in the bed at night. DO NOT sleep  in your recliner. Long hours of sitting in a recliner leads to swelling of the legs and/or potential wounds on your backside. Additional Orders / Instructions: Follow Nutritious Diet and Increase Protein Intake WOUND #1: - Lower Leg Wound Laterality: Left, Medial Cleanser: Wound Cleanser (Home Health) 3 x Per Week/30 Days Discharge Instructions: Wash your hands with soap and water. Remove old dressing, discard into plastic bag and place into trash. Cleanse the wound with Wound Cleanser prior to applying a clean dressing using gauze sponges, not tissues or cotton balls. Do not scrub or use excessive force. Pat dry using gauze sponges, not tissue or cotton balls. Peri-Wound Care: Nystatin Cream USP 30 (g) 3 x Per Week/30 Days Discharge Instructions: Use Nystatin Cream as directed. MIx 1:1 with TCA cream and applied to peri wound Peri-Wound Care: Triamcinolone Acetonide Cream, 0.1%, 15 (g) tube 3 x Per Week/30 Days Discharge Instructions: Apply as directed. mix 1:1 with nystatin cream for peri wound Primary Dressing: Aquacel Extra Hydrofiber Dressing, 4x5 (in/in) 3 x Per Week/30 Days Primary Dressing: keystone compound 3 x Per Week/30 Days Discharge Instructions: apply to wound bed as directed on medication label PLEASE follow direction on pill bottle for proper mixing Secondary Dressing: Zetuvit Absorbent Pad, 4x8 (in/in) 3 x Per Week/30 Days Compression Wrap: 3-LAYER WRAP - Profore Lite LF 3 Multilayer Compression Bandaging System 3 x Per Week/30 Days Discharge Instructions: Apply 3 multi-layer wrap as prescribed. 1. In office sharp debridement 2. Calcium alginate with Keystone antibiotic under 3 layer compression 3. Follow-up in 1 week 4. Reach out to home health to help facilitate better communication for wound care for the patient Electronic Signature(s) Signed: 07/12/2022 1:00:58 PM By: Kalman Shan DO Entered By: Kalman Shan on 07/12/2022 12:41:16 Laura Santiago  ([id number]) -------------------------------------------------------------------------------- [place] Details Patient Name: Laura Santiago. Date of Service: 07/12/2022 Medical Record Number: [id number] Patient Account Number: [id number] Date of Birth/Sex: 1946/05/16 (76 y.o. F) Treating RN: Cornell Barman Primary Care Provider: Hilda Lias, [place] Other Clinician: [name] Referring Provider: [facility] Treating Provider/Extender: Yaakov Guthrie in Treatment: 63 Diagnosis Coding ICD-10 Codes Code Description [phone] 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 I87.2 Venous insufficiency (chronic) (peripheral) Z79.01 Long term (current) use of anticoagulants I10 Essential (primary) hypertension 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 Facility Procedures CPT4 Code: [id number] Description: 00867 - DEB SUBQ TISSUE 20 SQ CM/< Modifier: Quantity: 1 CPT4 Code: Description: ICD-10 Diagnosis Description L97.822 Non-pressure chronic ulcer of other part of left lower leg with fat layer Modifier: exposed Quantity: CPT4 Code: [id number] Description: 11045 - DEB SUBQ TISS EA ADDL 20CM Modifier: Quantity: 6 CPT4 Code: Description: ICD-10 Diagnosis Description L97.822 Non-pressure chronic ulcer of other part of left lower leg with fat layer Modifier: exposed Quantity: Physician Procedures CPT4 Code: [id number] Description: 11042 - WC PHYS SUBQ TISS 20 SQ CM Modifier: Quantity: 1 CPT4 Code: Description: ICD-10 Diagnosis Description L97.822 Non-pressure chronic ulcer of other part of left lower leg with fat layer Modifier: exposed Quantity: CPT4 Code: [id number] Description: 11045 - WC PHYS SUBQ TISS EA ADDL 20 CM Modifier: Quantity: 6 CPT4 Code: Description: ICD-10 Diagnosis Description L97.822 Non-pressure chronic ulcer of other part of left  lower leg with fat layer Modifier: exposed Quantity: Electronic Signature(s) Signed: 07/12/2022 1:00:58 PM  By: Kalman Shan DO Entered By: Kalman Shan on 07/12/2022 12:41:35

## 2022-07-14 NOTE — Progress Notes (Signed)
KATINA, REMICK ([id number]) Visit Report for 07/12/2022 Arrival Information Details Patient Name: Laura Santiago, Laura Santiago. Date of Service: 07/12/2022 10:30 AM Medical Record Number: [id number] Patient Account Number: [id number] Date of Birth/Sex: Jan 18, 1946 (76 y.o. F) Treating RN: Cornell Barman Primary Care Sameen Leas: [facility], [place] Other Clinician: [name] Referring Jaquel Glassburn: [facility] Treating Amdrew Oboyle/Extender: Yaakov Guthrie in Treatment: 9 Visit Information History Since Last Visit All ordered tests and consults were completed: No Patient Arrived: Laura Santiago Added or deleted any medications: No Arrival Time: 11:26 Any new allergies or adverse reactions: No Transfer Assistance: None Had a fall or experienced change in No Patient Requires Transmission-Based No activities of daily living that may affect Precautions: risk of falls: Patient Has Alerts: Yes Hospitalized since last visit: No Patient Alerts: PT HAS SERVICE Pain Present Now: No ANIMAL ABI 07/11/21 R) 1.16 L) 1.27 Electronic Signature(s) Signed: 07/12/2022 4:00:18 PM By: [name] Entered By: [name] on 07/12/2022 11:26:36 Laura Santiago ([id number]) -------------------------------------------------------------------------------- Clinic Level of Care Assessment Details Patient Name: Laura Santiago. Date of Service: 07/12/2022 10:30 AM Medical Record Number: [id number] Patient Account Number: [id number] Date of Birth/Sex: [birth year] (76 y.o. F) Treating RN: Cornell Barman Primary Care Jaydence Vanyo: [facility], [place] Other Clinician: [name] Referring Zenora Karpel: [facility] Treating Philisha Weinel/Extender: Yaakov Guthrie in Treatment: 70 Clinic Level of Care Assessment Items TOOL 1 Quantity Score _0  - Use when EandM and Procedure is performed on INITIAL visit 0 ASSESSMENTS - Nursing Assessment / Reassessment _1  - General Physical Exam (combine w/ comprehensive  assessment (listed just below) when performed on new 0 pt. evals) _2  - 0 Comprehensive Assessment (HX, ROS, Risk Assessments, Wounds Hx, etc.) ASSESSMENTS - Wound and Skin Assessment / Reassessment _3  - Dermatologic / Skin Assessment (not related to wound area) 0 ASSESSMENTS - Ostomy and/or Continence Assessment and Care _4  - Incontinence Assessment and Management 0 _5  - 0 Ostomy Care Assessment and Management (repouching, etc.) PROCESS - Coordination of Care _6  - Simple Patient / Family Education for ongoing care 0 _7  - 0 Complex (extensive) Patient / Family Education for ongoing care _8  - 0 Staff obtains Programmer, systems, Records, Test Results / Process Orders _9  - 0 Staff telephones HHA, Nursing Homes / Clarify orders / etc _10  - 0 Routine Transfer to another Facility (non-emergent condition) _11  - 0 Routine Hospital Admission (non-emergent condition) _12  - 0 New Admissions / Biomedical engineer / Ordering NPWT, Apligraf, etc. _13  - 0 Emergency Hospital Admission (emergent condition) PROCESS - Special Needs _14  - Pediatric / Minor Patient Management 0 _15  - 0 Isolation Patient Management _16  - 0 Hearing / Language / Visual special needs _17  - 0 Assessment of Community assistance (transportation, D/C planning, etc.) _18  - 0 Additional assistance / Altered mentation _19  - 0 Support Surface(s) Assessment (bed, cushion, seat, etc.) INTERVENTIONS - Miscellaneous _20  - External ear exam 0 _21  - 0 Patient Transfer (multiple staff / Civil Service fast streamer / Similar devices) _22  - 0 Simple Staple / Suture removal (25 or less) _23  - 0 Complex Staple / Suture removal (26 or more) _24  - 0 Hypo/Hyperglycemic Management (do not check if billed separately) _25  - 0 Ankle / Brachial Index (ABI) - do not check if billed separately Has the patient been seen at the hospital within the last three years: Yes Total Score: 0 Level Of Care: ____ Laura Santiago ([id number]) Electronic Signature(s) Signed:  07/12/2022 4:00:18 PM By: [name] Entered By: [name] on 07/12/2022 12:06:04 Laura Santiago ([id number]) --------------------------------------------------------------------------------  Compression Therapy Details Patient Name: Laura Santiago, Laura Santiago. Date of Service: 07/12/2022 10:30 AM Medical Record Number: [id number] Patient Account Number: [id number] Date of Birth/Sex: [birth year] (76 y.o. F) Treating RN: Cornell Barman Primary Care Meghna Hagmann: [facility], [place] Other Clinician: [name] Referring Jaymar Loeber: [facility] Treating Malorie Bigford/Extender: Yaakov Guthrie in Treatment: 62 Compression Therapy Performed for Wound Assessment: Wound #1 Left,Medial Lower Leg Performed By: [name], [name], Compression Type: Three Layer Pre Treatment ABI: 1.5 Post Procedure Diagnosis Same as Pre-procedure Electronic Signature(s) Signed: 07/12/2022 4:00:18 PM By: [name] Entered By: [name] on 07/12/2022 11:46:18 Laura Santiago ([id number]) -------------------------------------------------------------------------------- Encounter Discharge Information Details Patient Name: Laura Santiago. Date of Service: 07/12/2022 10:30 AM Medical Record Number: [id number] Patient Account Number: [id number] Date of Birth/Sex: [birth year] (76 y.o. F) Treating RN: Cornell Barman Primary Care Cena Bruhn: [facility], [place] Other Clinician: [name] Referring Kaleeyah Cuffie: [facility] Treating Sagan Maselli/Extender: Yaakov Guthrie in Treatment: 88 Encounter Discharge Information Items Post Procedure Vitals Discharge Condition: Stable Temperature (F): 98.0 Ambulatory Status: Walker Pulse (bpm): 66 Discharge Destination: Home Respiratory Rate (breaths/min): 18 Transportation: Other Blood Pressure (mmHg): 144/73 Accompanied By: self Schedule Follow-up Appointment: Yes Clinical Summary of Care: Electronic Signature(s) Signed: 07/12/2022 4:00:18 PM  By: [name] Entered By: [name] on 07/12/2022 12:07:54 Laura Santiago ([id number]) -------------------------------------------------------------------------------- Lower Extremity Assessment Details Patient Name: Laura Santiago. Date of Service: 07/12/2022 10:30 AM Medical Record Number: [id number] Patient Account Number: [id number] Date of Birth/Sex: 21-Jan-1946 (76 y.o. F) Treating RN: Cornell Barman Primary Care Chynah Orihuela: [facility], [place] Other Clinician: [name] Referring Fady Stamps: [facility] Treating Danaiya Steadman/Extender: Yaakov Guthrie in Treatment: 62 Electronic Signature(s) Signed: 07/12/2022 4:00:18 PM By: [name] Signed: 07/14/2022 3:39:08 PM By: Gretta Cool, BSN, RN, CWS, [name] RN, BSN Entered By: [name] on 07/12/2022 11:40:46 Laura Santiago, Laura Santiago ([id number]) -------------------------------------------------------------------------------- Multi Wound Chart Details Patient Name: Laura Santiago, Laura Santiago. Date of Service: 07/12/2022 10:30 AM Medical Record Number: [id number] Patient Account Number: [id number] Date of Birth/Sex: Sep 05, 1946 (76 y.o. F) Treating RN: Cornell Barman Primary Care Ceanna Wareing: [facility], [place] Other Clinician: [name] Referring Jensine Luz: [facility] Treating Nyan Dufresne/Extender: Yaakov Guthrie in Treatment: 41 Vital Signs Height(in): 66 Pulse(bpm): 107 Weight(lbs): 153 Blood Pressure(mmHg): 144/73 Body Mass Index(BMI): 24.7 Temperature(F): 98.0 Respiratory Rate(breaths/min): 18 Photos: [N/A:N/A] Wound Location: Left, Medial Lower Leg N/A N/A Wounding Event: Gradually Appeared N/A N/A Primary Etiology: Venous Leg Ulcer N/A N/A Comorbid History: Hypertension, Osteoarthritis, N/A N/A Received Chemotherapy, Received Radiation Date Acquired: 04/06/2021 N/A N/A Weeks of Treatment: 53 N/A N/A Wound Status: Open N/A N/A Wound Recurrence: No N/A N/A Clustered Wound: Yes N/A N/A Measurements L  x W x D (cm) 9.6x15x0.2 N/A N/A Area (cm) : 113.097 N/A N/A Volume (cm) : 22.619 N/A N/A % Reduction in Area: -25.20% N/A N/A % Reduction in Volume: -25.20% N/A N/A Classification: Full Thickness Without Exposed N/A N/A Support Structures Exudate Amount: Medium N/A N/A Exudate Type: Serosanguineous N/A N/A Exudate Color: red, brown N/A N/A Wound Margin: Flat and Intact N/A N/A Granulation Amount: Medium (34-66%) N/A N/A Granulation Quality: Red, Pink N/A N/A Necrotic Amount: Medium (34-66%) N/A N/A Exposed Structures: Fat Layer (Subcutaneous Tissue): N/A N/A Yes Fascia: No Tendon: No Muscle: No Joint: No Bone: No Epithelialization: Small (1-33%) N/A N/A Treatment Notes Electronic Signature(s) Signed: 07/12/2022 4:00:18 PM By: [name] Entered By: [name] on 07/12/2022 11:41:01 Laura Santiago ([id number]) Laura Santiago, Laura Santiago ([id number]) -------------------------------------------------------------------------------- Multi-Disciplinary Care Plan Details Patient Name: Laura Santiago,  Ivori M. Date of Service: 07/12/2022 10:30 AM Medical Record Number: [id number] Patient Account Number: [id number] Date of Birth/Sex: [birth year] (76 y.o. F) Treating RN: Cornell Barman Primary Care Abby Stines: [facility], [place] Other Clinician: [name] Referring Ethell Blatchford: [facility] Treating Meily Glowacki/Extender: Yaakov Guthrie in Treatment: 40 Active Inactive Soft Tissue Infection Nursing Diagnoses: Impaired tissue integrity Potential for infection: soft tissue Goals: Patient's soft tissue infection will resolve Date Initiated: 03/15/2022 Target Resolution Date: 04/27/2022 Goal Status: Active Signs and symptoms of infection will be recognized early to allow for prompt treatment Date Initiated: 03/15/2022 Date Inactivated: 04/26/2022 Target Resolution Date: 04/27/2022 Goal Status: Met Interventions: Assess signs and symptoms of infection every visit Treatment  Activities: Culture and sensitivity : 03/15/2022 Notes: Wound/Skin Impairment Nursing Diagnoses: Impaired tissue integrity Goals: Patient/caregiver will verbalize understanding of skin care regimen Date Initiated: 07/06/2021 Date Inactivated: 07/27/2021 Target Resolution Date: 07/06/2021 Goal Status: Met Ulcer/skin breakdown will have a volume reduction of 30% by week 4 Date Initiated: 07/06/2021 Date Inactivated: 10/12/2021 Target Resolution Date: 08/06/2021 Goal Status: Unmet Unmet Reason: cont tx Ulcer/skin breakdown will have a volume reduction of 50% by week 8 Date Initiated: 07/06/2021 Target Resolution Date: 09/06/2021 Goal Status: Active Ulcer/skin breakdown will have a volume reduction of 80% by week 12 Date Initiated: 07/06/2021 Target Resolution Date: 10/06/2021 Goal Status: Active Ulcer/skin breakdown will heal within 14 weeks Date Initiated: 07/06/2021 Target Resolution Date: 11/06/2021 Goal Status: Active Interventions: Assess patient/caregiver ability to obtain necessary supplies Assess patient/caregiver ability to perform ulcer/skin care regimen upon admission and as needed Assess ulceration(s) every visit Treatment Activities: Referred to DME Kenlie Seki for dressing supplies : 07/06/2021 Skin care regimen initiated : 07/06/2021 Notes: GARLENE, APPERSON ([id number]) Electronic Signature(s) Signed: 07/12/2022 4:00:18 PM By: [name] Signed: 07/14/2022 3:39:08 PM By: Gretta Cool, BSN, RN, CWS, [name] RN, BSN Entered By: [name] on 07/12/2022 11:40:50 Laura Santiago, Laura Santiago ([id number]) -------------------------------------------------------------------------------- Pain Assessment Details Patient Name: Laura Santiago, Laura Santiago. Date of Service: 07/12/2022 10:30 AM Medical Record Number: [id number] Patient Account Number: [id number] Date of Birth/Sex: [birth year] (76 y.o. F) Treating RN: Cornell Barman Primary Care Luster Hechler: [facility], [place] Other Clinician: [name] Referring  Jousha Schwandt: [facility] Treating Jaquila Santelli/Extender: Yaakov Guthrie in Treatment: 66 Active Problems Location of Pain Severity and Description of Pain Patient Has Paino No Site Locations Pain Management and Medication Current Pain Management: Electronic Signature(s) Signed: 07/12/2022 4:00:18 PM By: [name] Signed: 07/14/2022 3:39:08 PM By: Gretta Cool, BSN, RN, CWS, [name] RN, BSN Entered By: [name] on 07/12/2022 11:29:02 Laura Santiago, Laura Santiago ([id number]) -------------------------------------------------------------------------------- Patient/Caregiver Education Details Patient Name: Laura Santiago, Laura Santiago. Date of Service: 07/12/2022 10:30 AM Medical Record Number: [id number] Patient Account Number: [id number] Date of Birth/Gender: 05-21-1946 (76 y.o. F) Treating RN: Cornell Barman Primary Care Physician: [facility], [place] Other Clinician: [name] Referring Physician: [facility] Treating Physician/Extender: Yaakov Guthrie in Treatment: 32 Education Assessment Education Provided To: Patient Education Topics Provided Wound/Skin Impairment: Handouts: Other: continue wound care as directed Electronic Signature(s) Signed: 07/12/2022 4:00:18 PM By: [name] Entered By: [name] on 07/12/2022 12:06:53 Laura Santiago ([id number]) -------------------------------------------------------------------------------- Wound Assessment Details Patient Name: Laura Santiago. Date of Service: 07/12/2022 10:30 AM Medical Record Number: [id number] Patient Account Number: [id number] Date of Birth/Sex: March 06, 1946 (76 y.o. F) Treating RN: Cornell Barman Primary Care Benelli Winther: [facility], [place] Other Clinician: [name] Referring Hairo Garraway: [facility] Treating Kellis Topete/Extender: Yaakov Guthrie in Treatment: 34 Wound Status Wound Number: 1 Primary Venous Leg Ulcer Etiology:  Wound Location: Left, Medial Lower Leg Wound Status:  Open Wounding Event: Gradually Appeared Comorbid Hypertension, Osteoarthritis, Received Chemotherapy, Date Acquired: 04/06/2021 History: Received Radiation Weeks Of Treatment: 53 Clustered Wound: Yes Photos Wound Measurements Length: (cm) 9.6 Width: (cm) 15 Depth: (cm) 0.2 Area: (cm) 113.097 Volume: (cm) 22.619 % Reduction in Area: -25.2% % Reduction in Volume: -25.2% Epithelialization: Small (1-33%) Wound Description Classification: Full Thickness Without Exposed Support Structures Wound Margin: Flat and Intact Exudate Amount: Medium Exudate Type: Serosanguineous Exudate Color: red, brown Foul Odor After Cleansing: No Slough/Fibrino Yes Wound Bed Granulation Amount: Medium (34-66%) Exposed Structure Granulation Quality: Red, Pink Fascia Exposed: No Necrotic Amount: Medium (34-66%) Fat Layer (Subcutaneous Tissue) Exposed: Yes Necrotic Quality: Adherent Slough Tendon Exposed: No Muscle Exposed: No Joint Exposed: No Bone Exposed: No Treatment Notes Wound #1 (Lower Leg) Wound Laterality: Left, Medial Cleanser Wound Cleanser Discharge Instruction: Wash your hands with soap and water. Remove old dressing, discard into plastic bag and place into trash. Cleanse the wound with Wound Cleanser prior to applying a clean dressing using gauze sponges, not tissues or cotton balls. Do not scrub or use excessive force. Pat dry using gauze sponges, not tissue or cotton balls. Laura Santiago, Laura Santiago ([id number]) Peri-Wound Care Nystatin Cream USP 30 (g) Discharge Instruction: Use Nystatin Cream as directed. MIx 1:1 with TCA cream and applied to peri wound Triamcinolone Acetonide Cream, 0.1%, 15 (g) tube Discharge Instruction: Apply as directed. mix 1:1 with nystatin cream for peri wound Topical Primary Dressing Aquacel Extra Hydrofiber Dressing, 4x5 (in/in) keystone compound Discharge Instruction: apply to wound bed as directed on medication label PLEASE follow direction on pill bottle  for proper mixing Secondary Dressing Zetuvit Absorbent Pad, 4x8 (in/in) Secured With Compression Wrap 3-LAYER WRAP - Profore Lite LF 3 Multilayer Compression Bandaging System Discharge Instruction: Apply 3 multi-layer wrap as prescribed. Compression Stockings Add-Ons Electronic Signature(s) Signed: 07/12/2022 4:00:18 PM By: [name] Signed: 07/14/2022 3:39:08 PM By: Gretta Cool, BSN, RN, CWS, [name] RN, BSN Entered By: [name] on 07/12/2022 11:40:36 Laura Santiago, VOIGT ([id number]) -------------------------------------------------------------------------------- [place] Details Patient Name: MAHITHA, HICKLING. Date of Service: 07/12/2022 10:30 AM Medical Record Number: [id number] Patient Account Number: [id number] Date of Birth/Sex: 09/03/1946 (76 y.o. F) Treating RN: Cornell Barman Primary Care Asael Pann: [facility], [place] Other Clinician: [name] Referring Ginamarie Banfield: [facility] Treating Glynn Freas/Extender: Yaakov Guthrie in Treatment: 65 Vital Signs Time Taken: 11:26 Temperature (F): 98.0 Height (in): 66 Pulse (bpm): 66 Weight (lbs): 153 Respiratory Rate (breaths/min): 18 Body Mass Index (BMI): 24.7 Blood Pressure (mmHg): 144/73 Reference Range: 80 - 120 mg / dl Electronic Signature(s) Signed: 07/12/2022 4:00:18 PM By: [name] Entered By: [name] on 07/12/2022 11:28:40

## 2022-07-17 ENCOUNTER — Telehealth: Payer: Self-pay

## 2022-07-17 NOTE — Telephone Encounter (Signed)
PA for Fentanyl Patches 25MCG/HR has been initiated with Jarrett Soho at [facility]. Per Jarrett Soho she needs to send PA via fax to our facility. Provided Jarrett Soho with our fax number and currently waiting for fax in order to complete PA.  Ticket Number for call: [phone]

## 2022-07-18 ENCOUNTER — Other Ambulatory Visit: Payer: Self-pay | Admitting: *Deleted

## 2022-07-18 ENCOUNTER — Inpatient Hospital Stay (HOSPITAL_BASED_OUTPATIENT_CLINIC_OR_DEPARTMENT_OTHER): Payer: Medicare Other | Admitting: Nurse Practitioner

## 2022-07-18 ENCOUNTER — Inpatient Hospital Stay: Payer: Medicare Other

## 2022-07-18 ENCOUNTER — Inpatient Hospital Stay: Payer: Medicare Other | Attending: Oncology

## 2022-07-18 ENCOUNTER — Telehealth: Payer: Self-pay

## 2022-07-18 VITALS — BP 142/68 | HR 69 | Temp 97.4°F | Resp 16 | Wt 140.0 lb

## 2022-07-18 VITALS — BP 135/69 | HR 69

## 2022-07-18 DIAGNOSIS — G893 Neoplasm related pain (acute) (chronic): Secondary | ICD-10-CM

## 2022-07-18 DIAGNOSIS — Z7901 Long term (current) use of anticoagulants: Secondary | ICD-10-CM | POA: Insufficient documentation

## 2022-07-18 DIAGNOSIS — Z5111 Encounter for antineoplastic chemotherapy: Secondary | ICD-10-CM

## 2022-07-18 DIAGNOSIS — C7951 Secondary malignant neoplasm of bone: Secondary | ICD-10-CM | POA: Diagnosis not present

## 2022-07-18 DIAGNOSIS — C787 Secondary malignant neoplasm of liver and intrahepatic bile duct: Secondary | ICD-10-CM | POA: Insufficient documentation

## 2022-07-18 DIAGNOSIS — Z170 Estrogen receptor positive status [ER+]: Secondary | ICD-10-CM | POA: Insufficient documentation

## 2022-07-18 DIAGNOSIS — C50919 Malignant neoplasm of unspecified site of unspecified female breast: Secondary | ICD-10-CM

## 2022-07-18 DIAGNOSIS — Z86718 Personal history of other venous thrombosis and embolism: Secondary | ICD-10-CM | POA: Diagnosis not present

## 2022-07-18 DIAGNOSIS — Z5112 Encounter for antineoplastic immunotherapy: Secondary | ICD-10-CM

## 2022-07-18 DIAGNOSIS — I129 Hypertensive chronic kidney disease with stage 1 through stage 4 chronic kidney disease, or unspecified chronic kidney disease: Secondary | ICD-10-CM | POA: Diagnosis not present

## 2022-07-18 DIAGNOSIS — C779 Secondary and unspecified malignant neoplasm of lymph node, unspecified: Secondary | ICD-10-CM | POA: Insufficient documentation

## 2022-07-18 DIAGNOSIS — C50911 Malignant neoplasm of unspecified site of right female breast: Secondary | ICD-10-CM | POA: Insufficient documentation

## 2022-07-18 DIAGNOSIS — N184 Chronic kidney disease, stage 4 (severe): Secondary | ICD-10-CM | POA: Diagnosis not present

## 2022-07-18 LAB — CBC WITH DIFFERENTIAL/PLATELET
Abs Immature Granulocytes: 0.04 10*3/uL (ref 0.00–0.07)
Basophils Absolute: 0 10*3/uL (ref 0.0–0.1)
Basophils Relative: 0 %
Eosinophils Absolute: 0.1 10*3/uL (ref 0.0–0.5)
Eosinophils Relative: 3 %
HCT: 33.1 % — ABNORMAL LOW (ref 36.0–46.0)
Hemoglobin: 9.9 g/dL — ABNORMAL LOW (ref 12.0–15.0)
Immature Granulocytes: 1 %
Lymphocytes Relative: 17 %
Lymphs Abs: 1 10*3/uL (ref 0.7–4.0)
MCH: 25.8 pg — ABNORMAL LOW (ref 26.0–34.0)
MCHC: 29.9 g/dL — ABNORMAL LOW (ref 30.0–36.0)
MCV: 86.2 fL (ref 80.0–100.0)
Monocytes Absolute: 0.4 10*3/uL (ref 0.1–1.0)
Monocytes Relative: 6 %
Neutro Abs: 4.2 10*3/uL (ref 1.7–7.7)
Neutrophils Relative %: 73 %
Platelets: 225 10*3/uL (ref 150–400)
RBC: 3.84 MIL/uL — ABNORMAL LOW (ref 3.87–5.11)
RDW: 15.6 % — ABNORMAL HIGH (ref 11.5–15.5)
WBC: 5.7 10*3/uL (ref 4.0–10.5)
nRBC: 0 % (ref 0.0–0.2)

## 2022-07-18 LAB — COMPREHENSIVE METABOLIC PANEL
ALT: 11 U/L (ref 0–44)
AST: 21 U/L (ref 15–41)
Albumin: 3.7 g/dL (ref 3.5–5.0)
Alkaline Phosphatase: 73 U/L (ref 38–126)
Anion gap: 9 (ref 5–15)
BUN: 27 mg/dL — ABNORMAL HIGH (ref 8–23)
CO2: 26 mmol/L (ref 22–32)
Calcium: 9 mg/dL (ref 8.9–10.3)
Chloride: 106 mmol/L (ref 98–111)
Creatinine, Ser: 1.49 mg/dL — ABNORMAL HIGH (ref 0.44–1.00)
GFR, Estimated: 36 mL/min — ABNORMAL LOW (ref >60)
Glucose, Bld: 117 mg/dL — ABNORMAL HIGH (ref 70–99)
Potassium: 4.1 mmol/L (ref 3.5–5.1)
Sodium: 141 mmol/L (ref 135–145)
Total Bilirubin: 0.2 mg/dL — ABNORMAL LOW (ref 0.3–1.2)
Total Protein: 7.3 g/dL (ref 6.5–8.1)

## 2022-07-18 MED ORDER — TRASTUZUMAB-DKST CHEMO 150 MG IV SOLR
6.0000 mg/kg | Freq: Once | INTRAVENOUS | Status: AC
  Administered 2022-07-18: 420 mg via INTRAVENOUS
  Filled 2022-07-18: qty 20

## 2022-07-18 MED ORDER — SODIUM CHLORIDE 0.9 % IV SOLN
Freq: Once | INTRAVENOUS | Status: AC
  Filled 2022-07-18: qty 250

## 2022-07-18 MED ORDER — DIPHENHYDRAMINE HCL 25 MG PO CAPS
50.0000 mg | ORAL_CAPSULE | Freq: Once | ORAL | Status: AC
  Administered 2022-07-18: 50 mg via ORAL
  Filled 2022-07-18: qty 2

## 2022-07-18 MED ORDER — HEPARIN SOD (PORK) LOCK FLUSH 100 UNIT/ML IV SOLN
500.0000 [IU] | Freq: Once | INTRAVENOUS | Status: AC | PRN
  Administered 2022-07-18: 500 [IU]
  Filled 2022-07-18: qty 5

## 2022-07-18 MED ORDER — SODIUM CHLORIDE 0.9 % IV SOLN
420.0000 mg | Freq: Once | INTRAVENOUS | Status: AC
  Administered 2022-07-18: 420 mg via INTRAVENOUS
  Filled 2022-07-18: qty 14

## 2022-07-18 MED ORDER — ACETAMINOPHEN 325 MG PO TABS
650.0000 mg | ORAL_TABLET | Freq: Once | ORAL | Status: AC
  Administered 2022-07-18: 650 mg via ORAL
  Filled 2022-07-18: qty 2

## 2022-07-18 NOTE — Progress Notes (Signed)
Returns for follow-up. Reports more pain recently, but states that she had not been using her pain patches infrequently. She has refilled her pain medications and pain is nor better controlled. She reports cramping in her hands and legs and would like recommendations on how to manage it. She would also like to know if she is able to have dental implants. She also reports low energy and would like to know how to manage.

## 2022-07-18 NOTE — Telephone Encounter (Signed)
PA for fentaNYL patches has been faxed to [facility] at [phone] and received a fax confirmation.

## 2022-07-18 NOTE — Patient Instructions (Signed)
[facility]  Discharge Instructions: Thank you for choosing [facility] to provide your oncology and hematology care.   If you have a lab appointment with the Cancer Center, please go directly to the Cancer Center and check in at the registration area.   Wear comfortable clothing and clothing appropriate for easy access to any Portacath or PICC line.   We strive to give you quality time with your provider. You may need to reschedule your appointment if you arrive late (15 or more minutes).  Arriving late affects you and other patients whose appointments are after yours.  Also, if you miss three or more appointments without notifying the office, you may be dismissed from the clinic at the provider's discretion.      For prescription refill requests, have your pharmacy contact our office and allow 72 hours for refills to be completed.    Today you received the following chemotherapy and/or immunotherapy agents       To help prevent nausea and vomiting after your treatment, we encourage you to take your nausea medication as directed.  BELOW ARE SYMPTOMS THAT SHOULD BE REPORTED IMMEDIATELY: *FEVER GREATER THAN 100.4 F (38 C) OR HIGHER *CHILLS OR SWEATING *NAUSEA AND VOMITING THAT IS NOT CONTROLLED WITH YOUR NAUSEA MEDICATION *UNUSUAL SHORTNESS OF BREATH *UNUSUAL BRUISING OR BLEEDING *URINARY PROBLEMS (pain or burning when urinating, or frequent urination) *BOWEL PROBLEMS (unusual diarrhea, constipation, pain near the anus) TENDERNESS IN MOUTH AND THROAT WITH OR WITHOUT PRESENCE OF ULCERS (sore throat, sores in mouth, or a toothache) UNUSUAL RASH, SWELLING OR PAIN  UNUSUAL VAGINAL DISCHARGE OR ITCHING   Items with * indicate a potential emergency and should be followed up as soon as possible or go to the Emergency Department if any problems should occur.  Please show the CHEMOTHERAPY ALERT CARD or IMMUNOTHERAPY ALERT CARD at check-in to the  Emergency Department and triage nurse.  Should you have questions after your visit or need to cancel or reschedule your appointment, please contact [facility]  Dept: [phone]  and follow the prompts.  Office hours are 8:00 a.m. to 4:30 p.m. Monday - Friday. Please note that voicemails left after 4:00 p.m. may not be returned until the following business day.  We are closed weekends and major holidays. You have access to a nurse at all times for urgent questions. Please call the main number to the clinic Dept: [phone] and follow the prompts.   For any non-urgent questions, you may also contact your provider using MyChart. We now offer e-Visits for anyone 18 and older to request care online for non-urgent symptoms. For details visit [email or web address].   Also download the MyChart app! Go to the app store, search "MyChart", open the app, select [facility], and log in with your MyChart username and password.  Masks are optional in the cancer centers. If you would like for your care team to wear a mask while they are taking care of you, please let them know. You may have one support person who is at least 78 years old accompany you for your appointments. 

## 2022-07-18 NOTE — Progress Notes (Signed)
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  She had a screening mammogram in September 2014 which showed 2.1 x 2.3 x 1.8 cm irregular mass in her right breast.  It was ER 10% positive PR negative and HER2 positive +3.  She received neoadjuvant chemotherapy with Taxol Herceptin and Perjeta for 4 cycles followed by dose dense AC/Herceptin x4 which she completed in March 2015.  She had a right lumpectomy on 04/03/2014 which showed scant residual invasive ductal carcinoma [id number].  She completed 1 year of adjuvant Herceptin chemotherapy and also completed adjuvant radiation treatment.  She was recommended anastrozole which she took on and off starting November 2015 and stopped sometime in 2020.   She was then hospitalized with neck pain and was found to have lytic lesions involving C5-C6 with pathological vertebral fractures.  She underwent radiation treatment to this area.  Image guided biopsy of the L1 vertebral body showed metastatic carcinoma consistent with breast origin ER 10% PR 0% and HER2 amplified ratio 5.1 average HER2 signal number per cell 15.0 average CEP 17 signals number per cell 3.0.  Baseline echocardiogram on 05/02/2021 showed a normal EF of 62% she was recommended  Taxol Herceptin and Perjeta which she received for 2 cycles at [facility] until June 10, 2021   She has chronic pain from her bone metastases for which she is currently on oxycodone 10 mg every 4 hours as needed and 12 mcg fentanyl patch.  She was seeing pain clinic when she was living in [name].   Patient has also been on Zometa when she was in [name] but she does have some ongoing dental issues.  She has received Xgeva in the past as well.  Her last Delton See was in July 2021.  Last PET scan was on 03/21/2021 which showed diffuse osseous metastatic disease involving the head neck chest abdomen and pelvis and spine.  Left lung apex hypermetabolic nodule and multiple hypermetabolic liver lesions concerning for disease involvement.  Enlarged hypermetabolic left inguinal lymph nodes along with hypermetabolic external iliac and left supraclavicular lymph nodes   Patient is now moved to [place] to be close to her daughter.  She lives in an independent living.  Patient received Taxol Herceptin and Perjeta for about a year and is presently on maintenance Herceptin and Perjeta    Interval history- Patient is 76 year old female who returns to clinic for follow-up and consideration of maintenance Herceptin and Perjeta. She says she has no interest in doing things but says this is long term. She's interested in riding horses or going to a barn. Rode in [place] when she was younger. Wounds of left leg are healing with new treatment. Pain is stable. She questions how long she'll need to continue treatment. Says she's tired of 'being sick'.   ECOG PS- 2 Pain scale- 2 Opioid  associated constipation- no  Review of systems- Review of Systems  Constitutional:  Positive for malaise/fatigue. Negative for chills, fever and weight loss.  HENT:  Negative for congestion, ear discharge and nosebleeds.   Eyes:  Negative for blurred vision.  Respiratory:  Negative for cough, hemoptysis, sputum production, shortness of breath and  wheezing.   Cardiovascular:  Positive for leg swelling. Negative for chest pain, palpitations, orthopnea and claudication.  Gastrointestinal:  Negative for abdominal pain, blood in stool, constipation, diarrhea, heartburn, melena, nausea and vomiting.  Genitourinary:  Negative for dysuria, flank pain, frequency, hematuria and urgency.  Musculoskeletal:  Positive for back pain. Negative for joint pain and myalgias.  Skin:  Negative for rash.  Neurological:  Negative for dizziness, tingling, focal weakness, seizures, weakness and headaches.  Endo/Heme/Allergies:  Does not bruise/bleed easily.  Psychiatric/Behavioral:  Negative for depression and suicidal ideas. The patient does not have insomnia.       Allergies  Allergen Reactions   Other Diarrhea and Nausea And Vomiting    Pt has had episodes of nausea, vomiting and diarrhea right after receiving IV technetium for NM study on two occasions.   Technetium Tc 31mMedronate Diarrhea and Nausea And Vomiting    Pt has had episodes of nausea, vomiting and diarrhea right after receiving IV technetium for NM study on two occasions.    Corticosteroids Other (See Comments)    [place]. Notes that it causes agitation intolerance   Sulfa Antibiotics Other (See Comments)    [place] has allergy but we do not know reactions when taking the drug   Celebrex [Celecoxib] Rash     Past Medical History:  Diagnosis Date   ADHD (attention deficit hyperactivity disorder)    [place]   Anemia    IDA 11/26/2019, Anemia in stage 4 chronic kidney disease ([place] 09/03/2020   Arthritis    osteoarthritis right knee 09/30/2014   Breast cancer ([place]05[place]   [place] 2 [place] 3+   Cancer related pain 03/28/2021   [place]   cervical compression fracture [place]18/2021   [place]   DVT of lower extremity, bilateral ([place] 03/30[place]2014   [place]  Generalized muscle weakness 03/31/2016   [place]  GERD (gastroesophageal reflux disease) 05/15/2013   [place]  [place], secondary [place] 04/25/2018   [place]  Hypertension 02/20/2016   [place]   [place]  Metabolic acidosis 099/37/1696  [place]  Metabolic syndrome 078/93/8101  [place]  Osteopenia after menopause 03/29/2016   [place]  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   [place]  Stasis ulcer of left lower extremity ([place] 03/29[place]2016   [place]    Past Surgical History:  Procedure Laterality Date   CESAREAN SECTION     unknown   fibroid removed  N/A    [place]   IR FLUORO GUIDE CV LINE LEFT  07/27/2021   IR PORT REPAIR CENTRAL VENOUS ACCESS DEVICE Left    [place]      unknown   PORTA CATH INSERTION N/A 02/13/2022   Procedure: [place]  CATH INSERTION;  Surgeon: Algernon Huxley, MD;  Location: [place] [facility];  Service: Cardiovascular;  Laterality: N/A;   [place] CATARACT EXTRACAP,INSERT LENS Bilateral  Bilateral 09/05/2012   in [place]     unknown    LUMPECTOMY Right 04/03/2014   in [place]    Social History   Socioeconomic History   Marital status: Divorced    Spouse name: Not on file   Number of children: Not on file   Years of education: Not on file   Highest education level: Not on file  Occupational History   Occupation: retired Teacher, music    Comment: In [place]  Tobacco Use   Smoking status: Never   Smokeless tobacco: Never  Vaping Use   Vaping Use: Never used  Substance and Sexual Activity   Alcohol use: Not Currently   Drug use: Yes    Comment: prescribed oxy, fentanyl patch   Sexual activity: Not Currently  Other Topics Concern   Not on file  Social History Narrative   Not on file   Social Determinants of Health   Financial Resource Strain: Low Risk  (03/17/2022)   Overall  Financial Resource Strain (CARDIA)    Difficulty of Paying Living Expenses: Not hard at all  Food Insecurity: No Food Insecurity (03/17/2022)   Hunger Vital Sign    Worried About Running Out of Food in the Last Year: Never true    Ran Out of Food in the Last Year: Never true  Transportation Needs: No Transportation Needs (03/17/2022)   PRAPARE - Hydrologist (Medical): No    Lack of Transportation (Non-Medical): No  Physical Activity: Inactive (03/17/2022)   Exercise Vital Sign    Days of Exercise per Week: 0 days    Minutes of Exercise per Session: 0 min  Stress: No Stress Concern Present (03/17/2022)   [place]    Feeling of Stress : Only a little  Social Connections: Socially Isolated (03/17/2022)   Social Connection and Isolation Panel [NHANES]    Frequency of Communication with Friends and Family: Twice a week    Frequency of Social Gatherings with Friends and Family: Twice a week    Attends Religious Services: Never    Marine scientist or Organizations: No    Attends Archivist Meetings: Never    Marital Status: Widowed  Intimate Partner Violence: Not At Risk (03/17/2022)   Humiliation, Afraid, Rape, and Kick questionnaire    Fear of Current or Ex-Partner: No    Emotionally Abused: No    Physically Abused: No    Sexually Abused: No    Family History  Problem Relation Age of Onset   Pancreatic cancer Mother    Stroke Father    Diabetes Father    Hypertension Father    Heart disease Father    Skin cancer Father    Varicose Veins Father    Skin cancer Brother    Cancer - Prostate Brother      Current Outpatient Medications:    acetaminophen (TYLENOL) 500 MG tablet, Take 500 mg by mouth every 4 (four) hours as needed., Disp: , Rfl:    amphetamine-dextroamphetamine (ADDERALL XR) 30 MG 24 hr capsule, Take 1 capsule (30 mg total) by mouth daily., Disp: 15 capsule, Rfl:  0   calcium citrate (CALCITRATE - DOSED IN MG ELEMENTAL CALCIUM) 950 (200 Ca) MG tablet, Take 200 mg of  elemental calcium by mouth daily., Disp: , Rfl:    DULoxetine (CYMBALTA) 30 MG capsule, Take by mouth. (Patient not taking: Reported on 06/06/2022), Disp: , Rfl:    fentaNYL (DURAGESIC) 25 MCG/HR, PLACE 1 PATCH ONTO THE SKIN EVERY THREE DAYS AS DIRECTED., Disp: 10 patch, Rfl: 0   gabapentin (NEURONTIN) 100 MG capsule, Take by mouth., Disp: , Rfl:    gentamicin cream (GARAMYCIN) 0.1 %, SMARTSIG:1 Application Topical (Patient not taking: Reported on 03/14/2022), Disp: , Rfl:    hydrOXYzine (ATARAX) 25 MG tablet, Take by mouth., Disp: , Rfl:    lisinopril (ZESTRIL) 20 MG tablet, Take 1 tablet (20 mg total) by mouth daily., Disp: 30 tablet, Rfl: 3   LORazepam (ATIVAN) 0.5 MG tablet, Take 1 tablet (0.5 mg total) by mouth daily as needed for anxiety., Disp: 30 tablet, Rfl: 0   Multiple Vitamin (MULTI-VITAMIN) tablet, Take 1 tablet by mouth daily., Disp: , Rfl:    naloxone (NARCAN) nasal spray 4 mg/0.1 mL, SPRAY 1 SPRAY INTO ONE NOSTRIL AS DIRECTED FOR OPIOID OVERDOSE (TURN PERSON ON SIDE AFTER DOSE. IF NO RESPONSE IN 2-3 MINUTES OR PERSON RESPONDS BUT RELAPSES, REPEAT USING A NEW SPRAY DEVICE AND SPRAY INTO THE OTHER NOSTRIL. CALL 911 AFTER USE.) * EMERGENCY USE ONLY * (Patient not taking: Reported on 03/14/2022), Disp: 1 each, Rfl: 0   ondansetron (ZOFRAN) 8 MG tablet, Take by mouth., Disp: , Rfl:    Oxycodone HCl 10 MG TABS, Take 1.5 tablets (15 mg total) by mouth every 6 (six) hours as needed., Disp: 90 tablet, Rfl: 0   rivaroxaban (XARELTO) 20 MG TABS tablet, Take 1 tablet (20 mg total) by mouth daily with supper., Disp: 30 tablet, Rfl: 3   sodium hypochlorite (DAKIN'S 1/4 STRENGTH) 0.125 % SOLN, Apply 1 application topically as directed. Moisten gauze with solution and wrap wound, Disp: , Rfl:  No current facility-administered medications for this visit.  Facility-Administered Medications Ordered in  Other Visits:    heparin lock flush 100 UNIT/ML injection, , , ,    sodium chloride flush (NS) 0.9 % injection 10 mL, 10 mL, Intracatheter, PRN, [name], MD  Physical exam:  Vitals:   07/18/22 0913  BP: (!) 142/68  Pulse: 69  Resp: 16  Temp: (!) 97.4 F (36.3 C)  TempSrc: Tympanic  SpO2: 100%  Weight: 140 lb (63.5 kg)   Physical Exam Constitutional:      General: She is not in acute distress. Cardiovascular:     Rate and Rhythm: Normal rate and regular rhythm.  Pulmonary:     Effort: Pulmonary effort is normal.     Breath sounds: Normal breath sounds.  Abdominal:     General: There is no distension.     Palpations: Abdomen is soft.  Musculoskeletal:     Comments: Kyphosis. Unna Boot on LLE.   Skin:    General: Skin is warm and dry.  Neurological:     Mental Status: She is alert and oriented to person, place, and time.  Psychiatric:        Mood and Affect: Mood normal.        Behavior: Behavior normal.         Latest Ref Rng & Units 07/18/2022    8:36 AM  CMP  Glucose 70 - 99 mg/dL 117   BUN 8 - 23 mg/dL 27   Creatinine 0.44 - 1.00 mg/dL 1.49   Sodium 135 - 145 mmol/L 141   Potassium 3.5 - 5.1 mmol/L  4.1   Chloride 98 - 111 mmol/L 106   CO2 22 - 32 mmol/L 26   Calcium 8.9 - 10.3 mg/dL 9.0   Total Protein 6.5 - 8.1 g/dL 7.3   Total Bilirubin 0.3 - 1.2 mg/dL 0.2   Alkaline Phos 38 - 126 U/L 73   AST 15 - 41 U/L 21   ALT 0 - 44 U/L 11       Latest Ref Rng & Units 07/18/2022    8:36 AM  CBC  WBC 4.0 - 10.5 K/uL 5.7   Hemoglobin 12.0 - 15.0 g/dL 9.9   Hematocrit 36.0 - 46.0 % 33.1   Platelets 150 - 400 K/uL 225      Assessment and plan- Patient is a 76 y.o. female with   Metastatic weakly ER positive HER2 positive breast cancer-metastatic to liver, lymph nodes, and bone.  Currently receiving maintenance Herceptin and Perjeta.  Tolerating well.  Labs reviewed and acceptable for continuation of treatment.  Proceed with Herceptin and Perjeta today.  She  will proceed directly for cycle 22 in 3 weeks and she will follow-up with Dr. Janese Banks in 6 weeks for consideration of cycle 23.  Last imaging May 06, 2022-CT chest abdomen pelvis 6 which showed evidence of stable disease in her bone as well as extensive adenopathy in her abdomen.  No evidence of progressive disease.  Will plan to reimage prior to her next visit with Dr. Janese Banks.  High risk medication-last echo was May 2023 which showed EF 50-55%.  Low normal.  Does not meet criteria to hold Herceptin and Perjeta.  Repeat echo July 06, 2022 was independently reviewed and LVEF is normal at 55-60%. Bone metastases-bone scan from May 06, 2022 was independently reviewed and revealed widespread bony metastatic disease.  Majority are stable.  Slight improvement in right humeral diaphysis, right acetabulum, right lateral femoral head.  1 new focus of mild uptake in the lower lumbar spine possibly degenerative versus metastatic disease.  She will continue reduced dose of Zometa 3 mg every 3 months. Next dose will be due around 09/06/22.  CKD-stable.  Continue to monitor. GFR 36 today.  Chronic left lower extremity ulcerations-chronic intermittent right lower extremity edema.  No indication for antibiotics recently. Fatigue- likely secondary to malignancy and treatment. Monitor.  Neoplasm related pain- continue pain medication as prescribed.  Port-a-cath- left chest. Placed 02/13/22. Functioning well.  Goals of care-Herceptin and Perjeta given until progression or toxicity.  Treatment given with palliative intent.  Disposition:  Treatment today 3 weeks- labs, herceptin & perjeta 5 weeks- ct chest abdomen pelvis 6 weeks- labs, Dr. Janese Banks, herceptin & perjeta.   Visit Diagnosis 1. Primary malignant neoplasm of breast with metastasis ([place])   2. Encounter for monoclonal antibody treatment for malignancy   3. Encounter for antineoplastic chemotherapy   4. Neoplasm related pain    Beckey Rutter, DNP, AGNP-C [place] at  [facility] [phone] (clinic) 07/18/2022

## 2022-07-19 ENCOUNTER — Encounter (HOSPITAL_BASED_OUTPATIENT_CLINIC_OR_DEPARTMENT_OTHER): Payer: Medicare Other | Admitting: Internal Medicine

## 2022-07-19 DIAGNOSIS — I87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: Secondary | ICD-10-CM | POA: Diagnosis not present

## 2022-07-19 DIAGNOSIS — L97822 Non-pressure chronic ulcer of other part of left lower leg with fat layer exposed: Secondary | ICD-10-CM | POA: Diagnosis not present

## 2022-07-20 NOTE — Progress Notes (Signed)
Laura Santiago, Laura Santiago ([id number]) Visit Report for 07/19/2022 Arrival Information Details Patient Name: Laura Santiago, Laura Santiago. Date of Service: 07/19/2022 11:15 AM Medical Record Number: [id number] Patient Account Number: [id number] Date of Birth/Sex: 10/27/1946 (76 y.o. F) Treating RN: Cornell Barman Primary Care Kimyata Milich: [facility], [place] Other Clinician: [name] Referring Vanessa Kampf: [facility] Treating Glendal Cassaday/Extender: Yaakov Guthrie in Treatment: 58 Visit Information History Since Last Visit All ordered tests and consults were completed: No Patient Arrived: Laura Santiago Added or deleted any medications: No Arrival Time: 11:27 Any new allergies or adverse reactions: No Transfer Assistance: None Had a fall or experienced change in No Patient Requires Transmission-Based No activities of daily living that may affect Precautions: risk of falls: Patient Has Alerts: Yes Hospitalized since last visit: No Patient Alerts: PT HAS SERVICE Pain Present Now: Yes ANIMAL ABI 07/11/21 R) 1.16 L) 1.27 Electronic Signature(s) Signed: 07/20/2022 9:36:17 AM By: [name] Entered By: [name] on 07/19/2022 11:28:04 Laura Santiago ([id number]) -------------------------------------------------------------------------------- Clinic Level of Care Assessment Details Patient Name: Laura Santiago. Date of Service: 07/19/2022 11:15 AM Medical Record Number: [id number] Patient Account Number: [id number] Date of Birth/Sex: September 09, 1946 (76 y.o. F) Treating RN: Cornell Barman Primary Care Eduardo Honor: [facility], [place] Other Clinician: [name] Referring Kivon Aprea: [facility] Treating Salih Williamson/Extender: Yaakov Guthrie in Treatment: 74 Clinic Level of Care Assessment Items TOOL 1 Quantity Score []  - Use when EandM and Procedure is performed on INITIAL visit 0 ASSESSMENTS - Nursing Assessment / Reassessment []  - General Physical Exam (combine w/ comprehensive  assessment (listed just below) when performed on new 0 pt. evals) []  - 0 Comprehensive Assessment (HX, ROS, Risk Assessments, Wounds Hx, etc.) ASSESSMENTS - Wound and Skin Assessment / Reassessment []  - Dermatologic / Skin Assessment (not related to wound area) 0 ASSESSMENTS - Ostomy and/or Continence Assessment and Care []  - Incontinence Assessment and Management 0 []  - 0 Ostomy Care Assessment and Management (repouching, etc.) PROCESS - Coordination of Care []  - Simple Patient / Family Education for ongoing care 0 []  - 0 Complex (extensive) Patient / Family Education for ongoing care []  - 0 Staff obtains Programmer, systems, Records, Test Results / Process Orders []  - 0 Staff telephones HHA, Nursing Homes / Clarify orders / etc []  - 0 Routine Transfer to another Facility (non-emergent condition) []  - 0 Routine Hospital Admission (non-emergent condition) []  - 0 New Admissions / Biomedical engineer / Ordering NPWT, Apligraf, etc. []  - 0 Emergency Hospital Admission (emergent condition) PROCESS - Special Needs []  - Pediatric / Minor Patient Management 0 []  - 0 Isolation Patient Management []  - 0 Hearing / Language / Visual special needs []  - 0 Assessment of Community assistance (transportation, D/C planning, etc.) []  - 0 Additional assistance / Altered mentation []  - 0 Support Surface(s) Assessment (bed, cushion, seat, etc.) INTERVENTIONS - Miscellaneous []  - External ear exam 0 []  - 0 Patient Transfer (multiple staff / Civil Service fast streamer / Similar devices) []  - 0 Simple Staple / Suture removal (25 or less) []  - 0 Complex Staple / Suture removal (26 or more) []  - 0 Hypo/Hyperglycemic Management (do not check if billed separately) []  - 0 Ankle / Brachial Index (ABI) - do not check if billed separately Has the patient been seen at the hospital within the last three years: Yes Total Score: 0 Level Of Care: ____ Laura Santiago ([id number]) Electronic Signature(s) Signed:  07/20/2022 9:36:17 AM By: [name] Entered By: [name] on 07/19/2022 11:58:03 Laura Santiago. ([id number]) --------------------------------------------------------------------------------  Compression Therapy Details Patient Name: Laura Santiago, Laura Santiago. Date of Service: 07/19/2022 11:15 AM Medical Record Number: [id number] Patient Account Number: [id number] Date of Birth/Sex: 30-Oct-1946 (76 y.o. F) Treating RN: Cornell Barman Primary Care Jonalyn Sedlak: [facility], [place] Other Clinician: [name] Referring Denzil Bristol: [facility] Treating Sacoya Mcgourty/Extender: Yaakov Guthrie in Treatment: 65 Compression Therapy Performed for Wound Assessment: Wound #1 Left,Medial Lower Leg Performed By: [name], [name], Compression Type: Three Layer Post Procedure Diagnosis Same as Pre-procedure Electronic Signature(s) Signed: 07/20/2022 9:36:17 AM By: [name] Entered By: [name] on 07/19/2022 11:55:07 Laura Santiago ([id number]) -------------------------------------------------------------------------------- Encounter Discharge Information Details Patient Name: Laura Santiago, Laura Santiago. Date of Service: 07/19/2022 11:15 AM Medical Record Number: [id number] Patient Account Number: [id number] Date of Birth/Sex: [birth year] (76 y.o. F) Treating RN: Cornell Barman Primary Care Alexei Doswell: [facility], [place] Other Clinician: [name] Referring Ariyonna Twichell: [facility] Treating Jakaylah Schlafer/Extender: Yaakov Guthrie in Treatment: 104 Encounter Discharge Information Items Post Procedure Vitals Discharge Condition: Stable Temperature (F): 98.1 Ambulatory Status: Walker Pulse (bpm): 68 Discharge Destination: Home Respiratory Rate (breaths/min): 18 Transportation: Other Blood Pressure (mmHg): 130/70 Accompanied By: self Schedule Follow-up Appointment: Yes Clinical Summary of Care: Electronic Signature(s) Signed: 07/20/2022 9:36:17 AM By: [name] Entered By: [name] on 07/19/2022 12:20:20 Laura Santiago ([id number]) -------------------------------------------------------------------------------- Lower Extremity Assessment Details Patient Name: Laura Santiago. Date of Service: 07/19/2022 11:15 AM Medical Record Number: [id number] Patient Account Number: [id number] Date of Birth/Sex: Apr 27, 1946 (76 y.o. F) Treating RN: Cornell Barman Primary Care Lennyn Gange: [facility], [place] Other Clinician: [name] Referring Truc Winfree: [facility] Treating Lihanna Biever/Extender: Yaakov Guthrie in Treatment: 54 Edema Assessment Assessed: [Left: Yes] [Right: No] Edema: [Left: Ye] [Right: s] Calf Left: Right: Point of Measurement: 10 cm From Medial Instep 23.2 cm Ankle Left: Right: Point of Measurement: 39 cm From Medial Instep 32.5 cm Vascular Assessment Pulses: Dorsalis Pedis Palpable: [Left:Yes] Electronic Signature(s) Signed: 07/19/2022 3:21:50 PM By: Gretta Cool, BSN, RN, CWS, [name] RN, BSN Signed: 07/20/2022 9:36:17 AM By: [name] Entered By: [name] on 07/19/2022 11:43:58 Laura Santiago ([id number]) -------------------------------------------------------------------------------- Multi Wound Chart Details Patient Name: Laura Santiago. Date of Service: 07/19/2022 11:15 AM Medical Record Number: [id number] Patient Account Number: [id number] Date of Birth/Sex: January 17, 1946 (76 y.o. F) Treating RN: Cornell Barman Primary Care Dream Nodal: [facility], [place] Other Clinician: [name] Referring Tanairy Payeur: [facility] Treating Truda Staub/Extender: Yaakov Guthrie in Treatment: 77 Vital Signs Height(in): 66 Pulse(bpm): 38 Weight(lbs): 153 Blood Pressure(mmHg): 130/70 Body Mass Index(BMI): 24.7 Temperature(F): 98.1 Respiratory Rate(breaths/min): 18 Photos: [N/A:N/A] Wound Location: Left, Medial Lower Leg N/A N/A Wounding Event: Gradually Appeared N/A N/A Primary Etiology:  Venous Leg Ulcer N/A N/A Comorbid History: Hypertension, Osteoarthritis, N/A N/A Received Chemotherapy, Received Radiation Date Acquired: 04/06/2021 N/A N/A Weeks of Treatment: 54 N/A N/A Wound Status: Open N/A N/A Wound Recurrence: No N/A N/A Clustered Wound: Yes N/A N/A Measurements L x W x D (cm) 9.6x12x0.2 N/A N/A Area (cm) : 90.478 N/A N/A Volume (cm) : 18.096 N/A N/A % Reduction in Area: -0.20% N/A N/A % Reduction in Volume: -0.20% N/A N/A Classification: Full Thickness Without Exposed N/A N/A Support Structures Exudate Amount: Medium N/A N/A Exudate Type: Serosanguineous N/A N/A Exudate Color: red, brown N/A N/A Wound Margin: Flat and Intact N/A N/A Granulation Amount: Medium (34-66%) N/A N/A Granulation Quality: Red, Pink N/A N/A Necrotic Amount: Medium (34-66%) N/A N/A Exposed Structures: Fat Layer (Subcutaneous Tissue): N/A N/A Yes Fascia: No Tendon: No Muscle: No Joint: No  Bone: No Laura Santiago, Laura Santiago ([id number]) Epithelialization: Small (1-33%) N/A N/A Treatment Notes Electronic Signature(s) Signed: 07/20/2022 9:36:17 AM By: [name] Entered By: [name] on 07/19/2022 11:44:11 Laura Santiago ([id number]) -------------------------------------------------------------------------------- Multi-Disciplinary Care Plan Details Patient Name: Laura Santiago, Laura Santiago. Date of Service: 07/19/2022 11:15 AM Medical Record Number: [id number] Patient Account Number: [id number] Date of Birth/Sex: [birth year] (76 y.o. F) Treating RN: Cornell Barman Primary Care Brentyn Seehafer: [facility], [place] Other Clinician: [name] Referring Jeris Easterly: [facility] Treating Yeison Sippel/Extender: Yaakov Guthrie in Treatment: 23 Active Inactive Soft Tissue Infection Nursing Diagnoses: Impaired tissue integrity Potential for infection: soft tissue Goals: Patient's soft tissue infection will resolve Date Initiated: 03/15/2022 Target Resolution Date: 04/27/2022 Goal  Status: Active Signs and symptoms of infection will be recognized early to allow for prompt treatment Date Initiated: 03/15/2022 Date Inactivated: 04/26/2022 Target Resolution Date: 04/27/2022 Goal Status: Met Interventions: Assess signs and symptoms of infection every visit Treatment Activities: Culture and sensitivity : 03/15/2022 Notes: Wound/Skin Impairment Nursing Diagnoses: Impaired tissue integrity Goals: Patient/caregiver will verbalize understanding of skin care regimen Date Initiated: 07/06/2021 Date Inactivated: 07/27/2021 Target Resolution Date: 07/06/2021 Goal Status: Met Ulcer/skin breakdown will have a volume reduction of 30% by week 4 Date Initiated: 07/06/2021 Date Inactivated: 10/12/2021 Target Resolution Date: 08/06/2021 Goal Status: Unmet Unmet Reason: cont tx Ulcer/skin breakdown will have a volume reduction of 50% by week 8 Date Initiated: 07/06/2021 Target Resolution Date: 09/06/2021 Goal Status: Active Ulcer/skin breakdown will have a volume reduction of 80% by week 12 Date Initiated: 07/06/2021 Target Resolution Date: 10/06/2021 Goal Status: Active Ulcer/skin breakdown will heal within 14 weeks Date Initiated: 07/06/2021 Target Resolution Date: 11/06/2021 Goal Status: Active Interventions: Assess patient/caregiver ability to obtain necessary supplies Assess patient/caregiver ability to perform ulcer/skin care regimen upon admission and as needed Assess ulceration(s) every visit Treatment Activities: Referred to DME Shaketa Serafin for dressing supplies : 07/06/2021 Skin care regimen initiated : 07/06/2021 Notes: Laura Santiago, Laura Santiago ([id number]) Electronic Signature(s) Signed: 07/19/2022 3:21:50 PM By: Gretta Cool, BSN, RN, CWS, [name] RN, BSN Signed: 07/20/2022 9:36:17 AM By: [name] Entered By: [name] on 07/19/2022 11:44:03 Laura Santiago ([id number]) -------------------------------------------------------------------------------- Pain Assessment  Details Patient Name: Laura Santiago, Laura Santiago. Date of Service: 07/19/2022 11:15 AM Medical Record Number: [id number] Patient Account Number: [id number] Date of Birth/Sex: 12/16/1945 (76 y.o. F) Treating RN: Cornell Barman Primary Care Pailynn Vahey: [facility], [place] Other Clinician: [name] Referring Tighe Gitto: [facility], [facility] Treating Silvina Hackleman/Extender: Yaakov Guthrie in Treatment: 56 Active Problems Location of Pain Severity and Description of Pain Patient Has Paino Yes Site Locations Pain Location: Pain in Ulcers Duration of the Pain. Constant / Intermittento Constant Rate the pain. Current Pain Level: 2 Character of Pain Describe the Pain: Burning, Cramping Pain Management and Medication Current Pain Management: Medication: Yes Rest: Yes Electronic Signature(s) Signed: 07/19/2022 3:21:50 PM By: Gretta Cool, BSN, RN, CWS, [name] RN, BSN Signed: 07/20/2022 9:36:17 AM By: [name] Entered By: [name] on 07/19/2022 11:31:12 Laura Santiago ([id number]) -------------------------------------------------------------------------------- Patient/Caregiver Education Details Patient Name: Laura Santiago, Laura Santiago. Date of Service: 07/19/2022 11:15 AM Medical Record Number: [id number] Patient Account Number: [id number] Date of Birth/Gender: March 26, 1946 (76 y.o. F) Treating RN: Cornell Barman Primary Care Physician: [facility], [place] Other Clinician: [name] Referring Physician: [facility] Treating Physician/Extender: Yaakov Guthrie in Treatment: 41 Education Assessment Education Provided To: Patient Education Topics Provided Wound/Skin Impairment: Handouts: Other: continue wound care as directed Methods: Explain/Verbal Responses: State content correctly Electronic Signature(s) Signed: 07/20/2022 9:36:17 AM By: Clifton James,  [name] Entered By: [name] on 07/19/2022 11:58:43 Laura Santiago  ([id number]) -------------------------------------------------------------------------------- Wound Assessment Details Patient Name: Laura Santiago, Laura Santiago. Date of Service: 07/19/2022 11:15 AM Medical Record Number: [id number] Patient Account Number: [id number] Date of Birth/Sex: 1946/09/03 (76 y.o. F) Treating RN: Cornell Barman Primary Care Earlie Arciga: [facility], [place] Other Clinician: [name] Referring Jazmaine Fuelling: [facility] Treating Yenesis Even/Extender: Yaakov Guthrie in Treatment: 54 Wound Status Wound Number: 1 Primary Venous Leg Ulcer Etiology: Wound Location: Left, Medial Lower Leg Wound Status: Open Wounding Event: Gradually Appeared Comorbid Hypertension, Osteoarthritis, Received Chemotherapy, Date Acquired: 04/06/2021 History: Received Radiation Weeks Of Treatment: 54 Clustered Wound: Yes Photos Wound Measurements Length: (cm) 9.6 Width: (cm) 12 Depth: (cm) 0.2 Area: (cm) 90.478 Volume: (cm) 18.096 % Reduction in Area: -0.2% % Reduction in Volume: -0.2% Epithelialization: Small (1-33%) Tunneling: No Undermining: No Wound Description Classification: Full Thickness Without Exposed Support Structures Wound Margin: Flat and Intact Exudate Amount: Medium Exudate Type: Serosanguineous Exudate Color: red, brown Foul Odor After Cleansing: No Slough/Fibrino Yes Wound Bed Granulation Amount: Medium (34-66%) Exposed Structure Granulation Quality: Red, Pink Fascia Exposed: No Necrotic Amount: Medium (34-66%) Fat Layer (Subcutaneous Tissue) Exposed: Yes Necrotic Quality: Adherent Slough Tendon Exposed: No Muscle Exposed: No Joint Exposed: No Bone Exposed: No Treatment Notes Wound #1 (Lower Leg) Wound Laterality: Left, Medial Cleanser Wound Cleanser Discharge Instruction: Wash your hands with soap and water. Remove old dressing, discard into plastic bag and place into trash. Cleanse the wound with Wound Cleanser prior to applying a clean dressing  using gauze sponges, not tissues or cotton balls. Do not scrub or use excessive force. Pat dry using gauze sponges, not tissue or cotton balls. Laura Santiago, Laura Santiago ([id number]) Peri-Wound Care Nystatin Cream USP 30 (g) Discharge Instruction: Use Nystatin Cream as directed. MIx 1:1 with TCA cream and applied to peri wound Triamcinolone Acetonide Cream, 0.1%, 15 (g) tube Discharge Instruction: Apply as directed. mix 1:1 with nystatin cream for peri wound Topical Primary Dressing Aquacel Extra Hydrofiber Dressing, 4x5 (in/in) keystone compound Discharge Instruction: apply to wound bed as directed on medication label PLEASE follow direction on pill bottle for proper mixing Secondary Dressing Zetuvit Absorbent Pad, 4x8 (in/in) Secured With Compression Wrap 3-LAYER WRAP - Profore Lite LF 3 Multilayer Compression Bandaging System Discharge Instruction: Apply 3 multi-layer wrap as prescribed. Compression Stockings Add-Ons Electronic Signature(s) Signed: 07/19/2022 3:21:50 PM By: Gretta Cool, BSN, RN, CWS, [name] RN, BSN Signed: 07/20/2022 9:36:17 AM By: [name] Entered By: [name] on 07/19/2022 11:42:13 ELAF, CLAUSON ([id number]) -------------------------------------------------------------------------------- Vitals Details Patient Name: MARISAL, SWAREY. Date of Service: 07/19/2022 11:15 AM Medical Record Number: [id number] Patient Account Number: [id number] Date of Birth/Sex: 1946-07-20 (76 y.o. F) Treating RN: Cornell Barman Primary Care Lavarius Doughten: [facility], [place] Other Clinician: [name] Referring Alexander Aument: [name], [facility] Treating Mcdaniel Ohms/Extender: Yaakov Guthrie in Treatment: 33 Vital Signs Time Taken: 11:28 Temperature (F): 98.1 Height (in): 66 Pulse (bpm): 68 Weight (lbs): 153 Respiratory Rate (breaths/min): 18 Body Mass Index (BMI): 24.7 Blood Pressure (mmHg): 130/70 Reference Range: 80 - 120 mg / dl Electronic Signature(s) Signed: 07/20/2022  9:36:17 AM By: [name] Entered By: [name] on 07/19/2022 11:31:08

## 2022-07-20 NOTE — Progress Notes (Signed)
Laura Santiago, Laura Santiago ([id number]) Visit Report for 07/19/2022 Chief Complaint Document Details Patient Name: Laura Santiago, Laura Santiago. Date of Service: 07/19/2022 11:15 AM Medical Record Number: [id number] Patient Account Number: [id number] Date of Birth/Sex: Jan 15, 1946 (76 y.o. F) Treating RN: Cornell Barman Primary Care Provider: Hilda Lias, [place] Other Clinician: [name] Referring Provider: [facility] Treating Provider/Extender: Yaakov Guthrie in Treatment: 64 Information Obtained from: Patient Chief Complaint Left lower extremity wound Right toe wounds Left upper lateral thigh wounds Electronic Signature(s) Signed: 07/19/2022 12:04:40 PM By: Kalman Shan DO Entered By: Kalman Shan on 07/19/2022 11:59:54 Laura Santiago ([id number]) -------------------------------------------------------------------------------- Debridement Details Patient Name: Laura Santiago. Date of Service: 07/19/2022 11:15 AM Medical Record Number: [id number] Patient Account Number: [id number] Date of Birth/Sex: 08/09/1946 (76 y.o. F) Treating RN: Cornell Barman Primary Care Provider: Hilda Lias, [place] Other Clinician: [name] Referring Provider: [facility] Treating Provider/Extender: Yaakov Guthrie in Treatment: 54 Debridement Performed for Wound #1 Left,Medial Lower Leg Assessment: Performed By: Physician Kalman Shan, MD Debridement Type: Debridement Severity of Tissue Pre Debridement: Fat layer exposed Level of Consciousness (Pre- Awake and Alert procedure): Pre-procedure Verification/Time Out Yes - 11:55 Taken: Start Time: 11:55 Total Area Debrided (L x W): 9.6 (cm) x 12 (cm) = 115.2 (cm) Tissue and other material Viable, Non-Viable, Slough, Subcutaneous, Slough debrided: Level: Skin/Subcutaneous Tissue Debridement Description: Excisional Instrument: Curette Bleeding: Minimum Hemostasis Achieved: Pressure End Time: 11:57 Response to  Treatment: Procedure was tolerated well Level of Consciousness (Post- Awake and Alert procedure): Post Debridement Measurements of Total Wound Length: (cm) 9.6 Width: (cm) 12 Depth: (cm) 0.3 Volume: (cm) 27.143 Character of Wound/Ulcer Post Debridement: Stable Severity of Tissue Post Debridement: Fat layer exposed Post Procedure Diagnosis Same as Pre-procedure Electronic Signature(s) Signed: 07/19/2022 12:04:40 PM By: Kalman Shan DO Signed: 07/19/2022 3:21:50 PM By: Gretta Cool, BSN, RN, CWS, [name] RN, BSN Signed: 07/20/2022 9:36:17 AM By: [name] Entered By: [name] on 07/19/2022 11:56:44 Laura Santiago ([id number]) -------------------------------------------------------------------------------- HPI Details Patient Name: Laura Santiago, Laura Santiago. Date of Service: 07/19/2022 11:15 AM Medical Record Number: [id number] Patient Account Number: [id number] Date of Birth/Sex: 1946/08/15 (76 y.o. F) Treating RN: Cornell Barman Primary Care Provider: Hilda Lias, [place] Other Clinician: [name] Referring Provider: [facility] Treating Provider/Extender: Yaakov Guthrie in Treatment: 9 History of Present Illness HPI Description: Admission 7/27 Laura Santiago is a 76 year old female with a past medical history of ADHD, metastatic breast cancer, stage IV chronic kidney disease, history of DVT on Xarelto and chronic venous insufficiency that presents to the clinic for a chronic left lower extremity wound. She recently moved to [facility] 4 days ago. She was being followed by wound care center in [name]. She reports a 10-year history of wounds to her left lower extremity that eventually do heal with debridement and compression therapy. She states that the current wound reopened 4 months ago and she is using Vaseline and Coban. She denies signs of infection. 8/3; patient presents for 1 week follow-up. She reports no issues or complaints today. She states  she had vascular studies done in the last week. She denies signs of infection. She brought her little service dog with her today. 8/17; patient presents for follow-up. She has missed her last clinic appointment. She states she took the wrap off and attempted to rewrap her leg. She is having difficulty with transportation. She has her service dog with her today. Overall she feels well and reports improvement in wound healing. She denies signs  of infection. She reports owning an old Velcro wrap compression and has this at her living facility 9/14; patient presents for follow-up. Patient states that over the past 2 to 3 weeks she developed toe wounds to her right foot. She attributes this to tight fitting shoes. She subsequently developed cellulitis in the right leg and has been treated by doxycycline by her oncologist. She reports improvement in symptoms however continues to have some redness and swelling to this leg. To the left lower extremity patient has been having her wraps changed with home health twice weekly. She states that the [facility] is not helping control the drainage. Other than that she has no issues or complaints today. She denies signs of infection to the left lower extremity. 9/21; patient presents for follow-up. She reports seeing infectious disease for her cellulitis. She reports no further management. She has home health that changes the wraps twice weekly. She has no issues or complaints today. She denies signs of infection. 10/5; patient presents for follow-up. She has no issues or complaints today. She denies signs of infection. She states that the right great toe has not been dressed by home health. 10/12; patient presents for follow-up. She has no issues or complaints today. She reports improvement in her wound healing. She has been using silver alginate to the right great toe wound. She denies signs of infection. 10/26; patient presents for follow-up. Home health did  not have sorbact so they continued to use Hydrofera Blue under the wrap. She has been using silver alginate to the great toe wound however she did not have a dressing in place today. She currently denies signs of infection. 11/2; patient presents for follow-up. She has been using sorb act under the compression wrap. She reports using silver alginate to the toe wound again she does not have a dressing in place. She currently denies signs of infection. 11/23; patient presents for follow-up. Unfortunately she has missed her last 2 clinic appointments. She was last seen 3 weeks ago. She did her own compression wrap with Kerlix and Coban yesterday after seeing vein and vascular. She has not been dressing her right great toe wound. She currently denies signs of infection. 11/30; patient presents for 1 week follow-up. She states she changed her dressing last week prior to home health and use sorb act with Dakin's and Hydrofera Blue. Home health has changed the dressing as well and they have been using sorbact. Today she reports increased redness to her right lower extremity. She has a history of cellulitis to this leg. She has been using silver alginate to the right great toe. Unfortunately she had an episode of diarrhea prior to coming in and had feces all over the right leg and to the wrap of her left leg. 12/7; patient presents for 1 week follow-up. She states that home health did not come out to change the dressing and she took it off yesterday. It is unclear if she is dressing the right toe wound. She denies signs of infection. 12/14; patient presents for 1 week follow-up. She has no issues or complaints today. 12/21; patient presents for follow-up. She has no issues or complaints today. She denies signs of infection. 12/28/2021; patient presents for follow-up. She was hospitalized for sepsis secondary to right lower extremity cellulitis On 12/23. She states she is currently at a SNF. She states that  she was started on doxycycline this morning for her right great toe swelling and redness. She is not sure what dressings  have been done to her left lower extremity for the past 3 weeks. She says its been mainly gauze with an Ace wrap. 1/25; patient presents for follow-up. She is still residing in a skilled nursing facility. She reports mild pain to the left lower extremity wound bed. She states she is going to see a podiatrist soon. 2/8; patient presents for follow-up. She has moved back to her residential community from her skilled nursing facility. She has no issues or complaints today. She denies signs of systemic infections. 2/15; patient presents for follow-up. He has no issues or complaints today. She denies systemic signs of infection. 2/22; patient presents for follow-up. She has no issues or complaints today. She denies signs of infection. Laura Santiago, Laura Santiago ([id number]) 3/1; patient presents for follow-up. She states that home health came out the day after she was seen in our clinic and yesterday to do the wrap change. She denies signs of infection. She reports excoriated skin on the ankle. 3/8; patient presents for follow-up. She has no issues or complaints today. She denies signs of infection. 3/15; patient presents for follow-up. Home health has been coming out to change the dressings. She reports more tenderness to the wound site. She denies purulent drainage, increased warmth or erythema to the area. 4/5; patient presents for follow-up. She has missed her last 2 clinic appointments. I have not seen her in 3 weeks. She was recently hospitalized for altered mental status. She was involuntarily committed. She was evaluated by psychiatry and deemed to have competency. There was no specific cause of her altered mental status. It was concluded that her physical and mental health were declining due to her chronic medical conditions. Currently home health has been coming out for dressing  changes. Patient has also been doing her own dressing changes. She reports more skin breakdown to the periwound and now has a new wound. She denies fever/chills. She reports continued tenderness to the wound site. 4/12; patient with significant venous insufficiency and a large wound on her left lower leg taking up about 80% of the circumference of her lower leg. Cultures of this grew MRSA and Pseudomonas. She had completed a course of ciprofloxacin now is starting doxycycline. She has been using Dakin's wet-to-dry and a Tubigrip. She has home health twice a week and we change it once. 4/19; patient presents for follow-up. She completed her course of doxycycline. She has been using Dakin's wet-to-dry dressing and Tubigrip. Home health changes the dressing twice weekly. Currently she has no issues or complaints. 4/26; patient presents for follow-up. At last clinic visit orders for home health were Iodosorb under compression therapy. Unfortunately they did not have the dressing and have been using Dakin's and gentamicin under the wrap. Patient currently denies signs of infection. She has no issues or complaints today. 5/3; patient presents for follow-up. Again Iodosorb has not been used under the compression therapy when home health comes out to change the wrap and dressing. They have been using Sorbact. It is unclear why this is happening since we send orders weekly to the agency. She denies signs of infection. Patient has not purchased the [facility] antibiotics. We reached out to the company and they said they have been trying to contact her on a regular basis. We gave the patient the number to call to order the medication. 5/10; patient presents for follow-up. She has no issues or complaints today. Again home health has not been using Iodosorb. Mepilex was on the wound bed. No other dressings  noted. She brought in her [facility] antibiotics. She denies signs of infection. 5/17; patient presents for  follow-up. Home health has come out twice since she was last seen. Joint well she has been using [facility]stone antibiotic with Sorbact under the compression wrap. She has no issues or complaints today. She denies signs of infection. 5/24; patient presents for follow-up. We have been using [facility] antibiotics with Sorbact under compression therapy. She is tolerating the treatment well. She is reporting improvement in wound healing. She denies signs of infection. 5/31; patient presents for follow-up. We continue to do [facility] antibiotics with Sorbact under compression therapy. She continues to report improvement in wound healing. Home health comes out and changes the dressing once weekly. 05-17-2022 upon evaluation today patient appears to be doing better in regard to her wound especially compared to the last time I saw her. Fortunately I do think that she is seeing improvements. With that being said I do believe that she may be benefit from sharp debridement today to clear away some of the necrotic debris I discussed that with her as well. She is an amendable to that plan. Otherwise she is very pleased with how the [employer] is doing for her. 6/14; patient presents for follow-up. We have been using Keystone antibiotic with Sorbact and absorbent dressings under 3 layer compression. She has no issues or complaints today. She reports improvement in wound healing. She denies signs of infection. 6/21; patient presents for follow-up. We are continuing with [facility] antibiotic and Sorbact under 3 layer compression. Patient has no complaints. Continued wound healing is happening. She denies signs of infection. 6/28; patient presents for follow-up. We have been using Keystone antibiotic with Sorbact under 3 layer compression. Usually home health comes out and changes the dressing twice a week. Unfortunately they did not go out to change the dressing. It is unclear why. Patient did not call them. She currently denies  signs of infection. 7/5; patient presents for follow-up. We have been using Keystone antibiotic with calcium alginate under 3 layer compression. She reports improvement in wound healing. She denies signs of infection. Home health has come out to do dressing changes twice this past week. 7/12; patient presents for follow-up. We have been using Keystone antibiotic with calcium alginate under 3 layer compression. Patient states that home health came out once last week to change the dressing. She reports improvement in wound healing. She currently denies signs of infection. 7/19; patient presents for follow-up. We have been using Keystone antibiotic with calcium alginate under 3 layer compression. Home health came out once last week to change the dressing. She has no issues or complaints today. She denies signs of infection. 8/2; patient presents for follow-up. We have been using Keystone antibiotic with calcium alginate under 3 layer compression. Unfortunately she missed her appointment last week and home health did not come out to do dressing changes. Patient currently denies signs of infection. 8/9; patient presents for follow-up. We have been using Keystone with calcium alginate under 3 layer compression. She states that home health came out once last week. She currently denies signs of infection. Her wrap was completely wet. She states she was cleaning the top of the leg and water soaked down into the wrap. Electronic Signature(s) Signed: 07/19/2022 12:04:40 PM By: Kalman Shan DO Entered By: Kalman Shan on 07/19/2022 12:00:58 Laura Santiago ([id number]) -------------------------------------------------------------------------------- Physical Exam Details Patient Name: Laura Santiago, Laura Santiago. Date of Service: 07/19/2022 11:15 AM Medical Record Number: [id number] Patient Account Number: [id number] Date  of Birth/Sex: [birth year] (76 y.o. F) Treating RN: Cornell Barman Primary Care Provider:  Hilda Lias, [place] Other Clinician: [name] Referring Provider: [facility] Treating Provider/Extender: Yaakov Guthrie in Treatment: 60 Constitutional . Cardiovascular . Psychiatric . Notes Left lower extremity: Large open wound with granulation tissue and nonviable tissue. No signs of surrounding soft tissue infection. Electronic Signature(s) Signed: 07/19/2022 12:04:40 PM By: Kalman Shan DO Entered By: Kalman Shan on 07/19/2022 12:01:19 Laura Santiago ([id number]) -------------------------------------------------------------------------------- Physician Orders Details Patient Name: Laura Santiago. Date of Service: 07/19/2022 11:15 AM Medical Record Number: [id number] Patient Account Number: [id number] Date of Birth/Sex: Feb 22, 1946 (76 y.o. F) Treating RN: Cornell Barman Primary Care Provider: Hilda Lias, [place] Other Clinician: [name] Referring Provider: [facility] Treating Provider/Extender: Yaakov Guthrie in Treatment: 83 Verbal / Phone Orders: No Diagnosis Coding Follow-up Appointments o Return Appointment in 1 week. o Nurse Visit as needed [place] for wound care. May utilize formulary equivalent dressing for wound treatment orders unless otherwise specified. Home Health Nurse may visit PRN to address patientos wound care needs. o **Please direct any NON-WOUND related issues/requests for orders to patient's Primary Care Physician. **If current dressing causes regression in wound condition, may D/C ordered dressing product/s and apply Normal Saline Moist Dressing daily until next [place] or Other MD appointment. **Notify Wound Healing Center of regression in wound condition at [phone]. o Other Home Health Orders/Instructions: - Dressing change 3 x weekly, twice by home health and once at wound clinic weekly. PLEASE make  sure frequency between dressing changes is appropriate. Bathing/ Shower/ Hygiene o May shower with wound dressing protected with water repellent cover or cast protector. o No tub bath. Anesthetic (Use 'Patient Medications' Section for Anesthetic Order Entry) o Lidocaine applied to wound bed Edema Control - Lymphedema / Segmental Compressive Device / Other o Optional: One layer of unna paste to top of compression wrap (to act as an anchor). - PLEASE when applying wrap start from toes and go up to just below knee o Elevate, Exercise Daily and Avoid Standing for Long Periods of Time. o Elevate legs to the level of the heart and pump ankles as often as possible o Elevate leg(s) parallel to the floor when sitting. o DO YOUR BEST to sleep in the bed at night. DO NOT sleep in your recliner. Long hours of sitting in a recliner leads to swelling of the legs and/or potential wounds on your backside. Additional Orders / Instructions o Follow Nutritious Diet and Increase Protein Intake Wound Treatment Wound #1 - Lower Leg Wound Laterality: Left, Medial Cleanser: Wound Cleanser (Home Health) 3 x Per Week/30 Days Discharge Instructions: Wash your hands with soap and water. Remove old dressing, discard into plastic bag and place into trash. Cleanse the wound with Wound Cleanser prior to applying a clean dressing using gauze sponges, not tissues or cotton balls. Do not scrub or use excessive force. Pat dry using gauze sponges, not tissue or cotton balls. Peri-Wound Care: Nystatin Cream USP 30 (g) 3 x Per Week/30 Days Discharge Instructions: Use Nystatin Cream as directed. MIx 1:1 with TCA cream and applied to peri wound Peri-Wound Care: Triamcinolone Acetonide Cream, 0.1%, 15 (g) tube 3 x Per Week/30 Days Discharge Instructions: Apply as directed. mix 1:1 with nystatin cream for peri wound Primary Dressing: Aquacel Extra Hydrofiber Dressing, 4x5 (in/in) 3 x Per Week/30 Days Primary  Dressing: keystone compound 3 x Per  Week/30 Days Discharge Instructions: apply to wound bed as directed on medication label PLEASE follow direction on pill bottle for proper mixing Secondary Dressing: Zetuvit Absorbent Pad, 4x8 (in/in) 3 x Per Week/30 Days Compression Wrap: 3-LAYER WRAP - Profore Lite LF 3 Multilayer Compression Bandaging System 3 x Per Week/30 Days Discharge Instructions: Apply 3 multi-layer wrap as prescribed. Laura Santiago, Laura Santiago ([id number]) Electronic Signature(s) Signed: 07/19/2022 12:04:40 PM By: Kalman Shan DO Entered By: Kalman Shan on 07/19/2022 12:03:47 Laura Santiago, Laura Santiago ([id number]) -------------------------------------------------------------------------------- Problem List Details Patient Name: Laura Santiago, Laura Santiago. Date of Service: 07/19/2022 11:15 AM Medical Record Number: [id number] Patient Account Number: [id number] Date of Birth/Sex: [birth year] (76 y.o. F) Treating RN: Cornell Barman Primary Care Provider: Hilda Lias, [place] Other Clinician: [name] Referring Provider: [facility] Treating Provider/Extender: Yaakov Guthrie in Treatment: 14 Active Problems ICD-10 Encounter Code Description Active Date MDM Diagnosis L97.822 Non-pressure chronic ulcer of other part of left lower leg with fat layer 11/02/2021 No Yes exposed I87.312 Chronic venous hypertension (idiopathic) with ulcer of left lower 11/02/2021 No Yes extremity I87.2 Venous insufficiency (chronic) (peripheral) 07/06/2021 No Yes Z79.01 Long term (current) use of anticoagulants 07/06/2021 No Yes I10 Essential (primary) hypertension 07/06/2021 No Yes 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 07/06/2021 No Yes Inactive Problems ICD-10 Code Description Active Date Inactive Date S81.802A Unspecified open wound, left lower leg, initial encounter 07/06/2021 07/06/2021 S91.101A Unspecified open wound of right great toe without damage to nail, initial 08/24/2021  08/24/2021 encounter S91.104A Unspecified open wound of right lesser toe(s) without damage to nail, initial 08/24/2021 08/24/2021 encounter Resolved Problems ICD-10 Code Description Active Date Resolved Date S91.104D Unspecified open wound of right lesser toe(s) without damage to nail, 08/31/2021 08/31/2021 subsequent encounter LAITYN, BENSEN ([id number]) S91.201D Unspecified open wound of right great toe with damage to nail, subsequent 08/31/2021 08/31/2021 encounter Electronic Signature(s) Signed: 07/19/2022 12:04:40 PM By: Kalman Shan DO Entered By: Kalman Shan on 07/19/2022 11:59:49 Laura Santiago ([id number]) -------------------------------------------------------------------------------- Progress Note Details Patient Name: Laura Santiago. Date of Service: 07/19/2022 11:15 AM Medical Record Number: [id number] Patient Account Number: [id number] Date of Birth/Sex: 12-Sep-1946 (76 y.o. F) Treating RN: Cornell Barman Primary Care Provider: [facility], [place] Other Clinician: [name] Referring Provider: [facility] Treating Provider/Extender: Yaakov Guthrie in Treatment: 40 Subjective Chief Complaint Information obtained from Patient Left lower extremity wound Right toe wounds Left upper lateral thigh wounds History of Present Illness (HPI) Admission 7/27 Ms. Hampton Cost is a 76 year old female with a past medical history of ADHD, metastatic breast cancer, stage IV chronic kidney disease, history of DVT on Xarelto and chronic venous insufficiency that presents to the clinic for a chronic left lower extremity wound. She recently moved to [facility] 4 days ago. She was being followed by wound care center in [name]. She reports a 10-year history of wounds to her left lower extremity that eventually do heal with debridement and compression therapy. She states that the current wound reopened 4 months ago and she is using Vaseline and Coban.  She denies signs of infection. 8/3; patient presents for 1 week follow-up. She reports no issues or complaints today. She states she had vascular studies done in the last week. She denies signs of infection. She brought her little service dog with her today. 8/17; patient presents for follow-up. She has missed her last clinic appointment. She states she took the wrap off and attempted to rewrap her leg. She is having difficulty with transportation. She has her  service dog with her today. Overall she feels well and reports improvement in wound healing. She denies signs of infection. She reports owning an old Velcro wrap compression and has this at her living facility 9/14; patient presents for follow-up. Patient states that over the past 2 to 3 weeks she developed toe wounds to her right foot. She attributes this to tight fitting shoes. She subsequently developed cellulitis in the right leg and has been treated by doxycycline by her oncologist. She reports improvement in symptoms however continues to have some redness and swelling to this leg. To the left lower extremity patient has been having her wraps changed with home health twice weekly. She states that the [facility] is not helping control the drainage. Other than that she has no issues or complaints today. She denies signs of infection to the left lower extremity. 9/21; patient presents for follow-up. She reports seeing infectious disease for her cellulitis. She reports no further management. She has home health that changes the wraps twice weekly. She has no issues or complaints today. She denies signs of infection. 10/5; patient presents for follow-up. She has no issues or complaints today. She denies signs of infection. She states that the right great toe has not been dressed by home health. 10/12; patient presents for follow-up. She has no issues or complaints today. She reports improvement in her wound healing. She has been  using silver alginate to the right great toe wound. She denies signs of infection. 10/26; patient presents for follow-up. Home health did not have sorbact so they continued to use Hydrofera Blue under the wrap. She has been using silver alginate to the great toe wound however she did not have a dressing in place today. She currently denies signs of infection. 11/2; patient presents for follow-up. She has been using sorb act under the compression wrap. She reports using silver alginate to the toe wound again she does not have a dressing in place. She currently denies signs of infection. 11/23; patient presents for follow-up. Unfortunately she has missed her last 2 clinic appointments. She was last seen 3 weeks ago. She did her own compression wrap with Kerlix and Coban yesterday after seeing vein and vascular. She has not been dressing her right great toe wound. She currently denies signs of infection. 11/30; patient presents for 1 week follow-up. She states she changed her dressing last week prior to home health and use sorb act with Dakin's and Hydrofera Blue. Home health has changed the dressing as well and they have been using sorbact. Today she reports increased redness to her right lower extremity. She has a history of cellulitis to this leg. She has been using silver alginate to the right great toe. Unfortunately she had an episode of diarrhea prior to coming in and had feces all over the right leg and to the wrap of her left leg. 12/7; patient presents for 1 week follow-up. She states that home health did not come out to change the dressing and she took it off yesterday. It is unclear if she is dressing the right toe wound. She denies signs of infection. 12/14; patient presents for 1 week follow-up. She has no issues or complaints today. 12/21; patient presents for follow-up. She has no issues or complaints today. She denies signs of infection. 12/28/2021; patient presents for follow-up. She  was hospitalized for sepsis secondary to right lower extremity cellulitis On 12/23. She states she is currently at a SNF. She states that she was started  on doxycycline this morning for her right great toe swelling and redness. She is not sure what dressings have been done to her left lower extremity for the past 3 weeks. She says its been mainly gauze with an Ace wrap. 1/25; patient presents for follow-up. She is still residing in a skilled nursing facility. She reports mild pain to the left lower extremity wound bed. She states she is going to see a podiatrist soon. 2/8; patient presents for follow-up. She has moved back to her residential community from her skilled nursing facility. She has no issues or complaints today. She denies signs of systemic infections. Laura Santiago, Laura Santiago ([id number]) 2/15; patient presents for follow-up. He has no issues or complaints today. She denies systemic signs of infection. 2/22; patient presents for follow-up. She has no issues or complaints today. She denies signs of infection. 3/1; patient presents for follow-up. She states that home health came out the day after she was seen in our clinic and yesterday to do the wrap change. She denies signs of infection. She reports excoriated skin on the ankle. 3/8; patient presents for follow-up. She has no issues or complaints today. She denies signs of infection. 3/15; patient presents for follow-up. Home health has been coming out to change the dressings. She reports more tenderness to the wound site. She denies purulent drainage, increased warmth or erythema to the area. 4/5; patient presents for follow-up. She has missed her last 2 clinic appointments. I have not seen her in 3 weeks. She was recently hospitalized for altered mental status. She was involuntarily committed. She was evaluated by psychiatry and deemed to have competency. There was no specific cause of her altered mental status. It was concluded that her  physical and mental health were declining due to her chronic medical conditions. Currently home health has been coming out for dressing changes. Patient has also been doing her own dressing changes. She reports more skin breakdown to the periwound and now has a new wound. She denies fever/chills. She reports continued tenderness to the wound site. 4/12; patient with significant venous insufficiency and a large wound on her left lower leg taking up about 80% of the circumference of her lower leg. Cultures of this grew MRSA and Pseudomonas. She had completed a course of ciprofloxacin now is starting doxycycline. She has been using Dakin's wet-to-dry and a Tubigrip. She has home health twice a week and we change it once. 4/19; patient presents for follow-up. She completed her course of doxycycline. She has been using Dakin's wet-to-dry dressing and Tubigrip. Home health changes the dressing twice weekly. Currently she has no issues or complaints. 4/26; patient presents for follow-up. At last clinic visit orders for home health were Iodosorb under compression therapy. Unfortunately they did not have the dressing and have been using Dakin's and gentamicin under the wrap. Patient currently denies signs of infection. She has no issues or complaints today. 5/3; patient presents for follow-up. Again Iodosorb has not been used under the compression therapy when home health comes out to change the wrap and dressing. They have been using Sorbact. It is unclear why this is happening since we send orders weekly to the agency. She denies signs of infection. Patient has not purchased the [place] antibiotics. We reached out to the company and they said they have been trying to contact her on a regular basis. We gave the patient the number to call to order the medication. 5/10; patient presents for follow-up. She has no issues or complaints  today. Again home health has not been using Iodosorb. Mepilex was on  the wound bed. No other dressings noted. She brought in her [facility] antibiotics. She denies signs of infection. 5/17; patient presents for follow-up. Home health has come out twice since she was last seen. Joint well she has been using Keystone antibiotic with Sorbact under the compression wrap. She has no issues or complaints today. She denies signs of infection. 5/24; patient presents for follow-up. We have been using Keystone antibiotics with Sorbact under compression therapy. She is tolerating the treatment well. She is reporting improvement in wound healing. She denies signs of infection. 5/31; patient presents for follow-up. We continue to do [facility] antibiotics with Sorbact under compression therapy. She continues to report improvement in wound healing. Home health comes out and changes the dressing once weekly. 05-17-2022 upon evaluation today patient appears to be doing better in regard to her wound especially compared to the last time I saw her. Fortunately I do think that she is seeing improvements. With that being said I do believe that she may be benefit from sharp debridement today to clear away some of the necrotic debris I discussed that with her as well. She is an amendable to that plan. Otherwise she is very pleased with how the [employer] is doing for her. 6/14; patient presents for follow-up. We have been using Keystone antibiotic with Sorbact and absorbent dressings under 3 layer compression. She has no issues or complaints today. She reports improvement in wound healing. She denies signs of infection. 6/21; patient presents for follow-up. We are continuing with [facility] antibiotic and Sorbact under 3 layer compression. Patient has no complaints. Continued wound healing is happening. She denies signs of infection. 6/28; patient presents for follow-up. We have been using Keystone antibiotic with Sorbact under 3 layer compression. Usually home health comes out and changes the  dressing twice a week. Unfortunately they did not go out to change the dressing. It is unclear why. Patient did not call them. She currently denies signs of infection. 7/5; patient presents for follow-up. We have been using Keystone antibiotic with calcium alginate under 3 layer compression. She reports improvement in wound healing. She denies signs of infection. Home health has come out to do dressing changes twice this past week. 7/12; patient presents for follow-up. We have been using Keystone antibiotic with calcium alginate under 3 layer compression. Patient states that home health came out once last week to change the dressing. She reports improvement in wound healing. She currently denies signs of infection. 7/19; patient presents for follow-up. We have been using Keystone antibiotic with calcium alginate under 3 layer compression. Home health came out once last week to change the dressing. She has no issues or complaints today. She denies signs of infection. 8/2; patient presents for follow-up. We have been using Keystone antibiotic with calcium alginate under 3 layer compression. Unfortunately she missed her appointment last week and home health did not come out to do dressing changes. Patient currently denies signs of infection. 8/9; patient presents for follow-up. We have been using Keystone with calcium alginate under 3 layer compression. She states that home health came out once last week. She currently denies signs of infection. Her wrap was completely wet. She states she was cleaning the top of the leg and water soaked down into the wrap. Laura Santiago, Laura Santiago ([id number]) Objective Constitutional Vitals Time Taken: 11:28 AM, Height: 66 in, Weight: 153 lbs, BMI: 24.7, Temperature: 98.1 F, Pulse: 68 bpm, Respiratory Rate:  18 breaths/min, Blood Pressure: 130/70 mmHg. General Notes: Left lower extremity: Large open wound with granulation tissue and nonviable tissue. No signs of  surrounding soft tissue infection. Integumentary (Hair, Skin) Wound #1 status is Open. Original cause of wound was Gradually Appeared. The date acquired was: 04/06/2021. The wound has been in treatment 54 weeks. The wound is located on the Left,Medial Lower Leg. The wound measures 9.6cm length x 12cm width x 0.2cm depth; 90.478cm^2 area and 18.096cm^3 volume. There is Fat Layer (Subcutaneous Tissue) exposed. There is no tunneling or undermining noted. There is a medium amount of serosanguineous drainage noted. The wound margin is flat and intact. There is medium (34-66%) red, pink granulation within the wound bed. There is a medium (34-66%) amount of necrotic tissue within the wound bed including Adherent Slough. 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 I debrided nonviable tissue. I recommended continuing with [facility] antibiotic, calcium alginate under 3 layer compression. Since she has restarted chemotherapy there has been a decline in wound healing. Not sure there is anything we can do about this. Was advised not to get the wrap wet. Follow-up in 1 week. 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 There was a Excisional Skin/Subcutaneous Tissue Debridement with a total area of 115.2 sq cm performed by Kalman Shan, MD. With the following instrument(s): Curette to remove Viable and Non-Viable tissue/material. Material removed includes Subcutaneous Tissue and Slough and. A time out was conducted at 11:55, prior to the start of the  procedure. A Minimum amount of bleeding was controlled with Pressure. The procedure was tolerated well. Post Debridement Measurements: 9.6cm length x 12cm width x 0.3cm depth; 27.143cm^3 volume. Character of Wound/Ulcer Post Debridement is stable. Severity of Tissue Post Debridement is: Fat layer exposed. 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 There was a Three Layer Compression Therapy Procedure by [name]. Post procedure Diagnosis Wound #1: Same as Pre-Procedure Plan Follow-up Appointments: Return Appointment in 1 week. Nurse Visit as needed Laura Santiago, Laura Santiago ([id number]) Home Health: [place] for wound care. May utilize formulary equivalent dressing for wound treatment orders unless otherwise specified. Home Health Nurse may visit PRN to address patient s wound care needs. **Please direct any NON-WOUND related issues/requests for orders to patient's Primary Care Physician. **If current dressing causes regression in wound condition, may D/C ordered dressing product/s and apply Normal Saline Moist Dressing daily until next [place] or Other MD appointment. **Notify [facility] of regression in wound condition at [phone]. Other Home Health Orders/Instructions: - Dressing change 3 x weekly, twice by home health and once at wound clinic weekly. PLEASE make sure frequency between dressing changes is appropriate. Bathing/ Shower/ Hygiene: May shower with wound dressing protected with water repellent cover or cast protector. No tub bath. Anesthetic (Use 'Patient Medications' Section for Anesthetic Order Entry): Lidocaine applied to wound bed Edema Control - Lymphedema / Segmental Compressive Device / Other: Optional: One layer of unna paste to top of compression wrap (to act  as an anchor). - PLEASE when  applying wrap start from toes and go up to just below knee Elevate, Exercise Daily and Avoid Standing for Long Periods of Time. Elevate legs to the level of the heart and pump ankles as often as possible Elevate leg(s) parallel to the floor when sitting. DO YOUR BEST to sleep in the bed at night. DO NOT sleep in your recliner. Long hours of sitting in a recliner leads to swelling of the legs and/or potential wounds on your backside. Additional Orders / Instructions: Follow Nutritious Diet and Increase Protein Intake WOUND #1: - Lower Leg Wound Laterality: Left, Medial Cleanser: Wound Cleanser (Home Health) 3 x Per Week/30 Days Discharge Instructions: Wash your hands with soap and water. Remove old dressing, discard into plastic bag and place into trash. Cleanse the wound with Wound Cleanser prior to applying a clean dressing using gauze sponges, not tissues or cotton balls. Do not scrub or use excessive force. Pat dry using gauze sponges, not tissue or cotton balls. Peri-Wound Care: Nystatin Cream USP 30 (g) 3 x Per Week/30 Days Discharge Instructions: Use Nystatin Cream as directed. MIx 1:1 with TCA cream and applied to peri wound Peri-Wound Care: Triamcinolone Acetonide Cream, 0.1%, 15 (g) tube 3 x Per Week/30 Days Discharge Instructions: Apply as directed. mix 1:1 with nystatin cream for peri wound Primary Dressing: Aquacel Extra Hydrofiber Dressing, 4x5 (in/in) 3 x Per Week/30 Days Primary Dressing: keystone compound 3 x Per Week/30 Days Discharge Instructions: apply to wound bed as directed on medication label PLEASE follow direction on pill bottle for proper mixing Secondary Dressing: Zetuvit Absorbent Pad, 4x8 (in/in) 3 x Per Week/30 Days Compression Wrap: 3-LAYER WRAP - Profore Lite LF 3 Multilayer Compression Bandaging System 3 x Per Week/30 Days Discharge Instructions: Apply 3 multi-layer wrap as prescribed. 1. In office sharp debridement 2. [facility]stone antibiotic, calcium alginate  under 3 layer compression 3. Follow-up in 1 week Electronic Signature(s) Signed: 07/19/2022 12:04:40 PM By: Kalman Shan DO Entered By: Kalman Shan on 07/19/2022 12:03:24 Laura Santiago ([id number]) -------------------------------------------------------------------------------- ROS/PFSH Details Patient Name: Laura Santiago. Date of Service: 07/19/2022 11:15 AM Medical Record Number: [id number] Patient Account Number: [id number] Date of Birth/Sex: 10-19-1946 (76 y.o. F) Treating RN: Cornell Barman Primary Care Provider: Hilda Lias, [place] Other Clinician: [name] Referring Provider: [facility] Treating Provider/Extender: Yaakov Guthrie in Treatment: 62 Information Obtained From Patient Eyes Medical History: Negative for: Cataracts; Glaucoma; Optic Neuritis Ear/Nose/Mouth/Throat Medical History: Negative for: Chronic sinus problems/congestion; Middle ear problems Hematologic/Lymphatic Medical History: Negative for: Anemia; Hemophilia; Human Immunodeficiency Virus; Lymphedema; Sickle Cell Disease Respiratory Medical History: Negative for: Aspiration; Asthma; Chronic Obstructive Pulmonary Disease (COPD); Pneumothorax; Sleep Apnea; Tuberculosis Cardiovascular Medical History: Positive for: Hypertension Negative for: Angina; Arrhythmia; Congestive Heart Failure; Coronary Artery Disease; Deep Vein Thrombosis; Hypotension; Myocardial Infarction; Peripheral Arterial Disease; Peripheral Venous Disease; Phlebitis; Vasculitis Gastrointestinal Medical History: Negative for: Cirrhosis ; Colitis; Crohnos; Hepatitis A; Hepatitis B; Hepatitis C Endocrine Medical History: Negative for: Type I Diabetes; Type II Diabetes Genitourinary Medical History: Negative for: End Stage Renal Disease Immunological Medical History: Negative for: Lupus Erythematosus; Raynaudos; Scleroderma Integumentary (Skin) Medical History: Negative for: History of Burn; History  of pressure wounds Musculoskeletal Medical History: Positive for: Osteoarthritis Laura Santiago, [place] ([id number]) Negative for: Gout; Rheumatoid Arthritis; Osteomyelitis Oncologic Medical History: Positive for: Received Chemotherapy; Received Radiation Past Medical History Notes: breast cancer Immunizations Pneumococcal Vaccine: Received Pneumococcal Vaccination: No Implantable Devices None Family and Social History 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 Date of Service: 07/19/2022 Medical Record Number: [id number] Patient Account Number: [id number] Date of Birth/Sex: [birth year] (76 y.o. F) Treating RN: Cornell Barman Primary Care Provider: Hilda Lias, [place] Other Clinician: [name] Referring Provider: 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 I87.2 Venous insufficiency (chronic) (peripheral) Z79.01 Long term (current) use of anticoagulants I10 Essential (primary) hypertension 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 Facility Procedures CPT4 Code: 78469629 Description: 52841 - DEB SUBQ TISSUE 20 SQ CM/< Modifier: Quantity: 1 CPT4 Code: Description: ICD-10 Diagnosis Description L97.822 Non-pressure chronic ulcer of other part of left lower leg with fat layer Modifier: exposed Quantity: CPT4 Code: [id number] Description: 72536 - DEB SUBQ TISS EA ADDL  20CM Modifier: Quantity: 5 CPT4 Code: Description: ICD-10 Diagnosis Description L97.822 Non-pressure chronic ulcer of other part of left lower leg with fat layer Modifier: exposed Quantity: Physician Procedures CPT4 Code: 6440347 Description: 11042 - WC PHYS SUBQ TISS 20 SQ CM Modifier: Quantity: 1 CPT4 Code: Description: ICD-10 Diagnosis Description L97.822 Non-pressure chronic ulcer of other part of left lower leg with fat layer Modifier: exposed Quantity: CPT4 Code: [id number] Description: 11045 - WC PHYS SUBQ TISS EA ADDL 20 CM Modifier: Quantity: 5 CPT4 Code: Description: ICD-10 Diagnosis Description L97.822 Non-pressure chronic ulcer of other part of left lower leg with fat layer Modifier: exposed Quantity: Electronic Signature(s) Signed: 07/19/2022 12:04:40 PM By: Kalman Shan DO Entered By: Kalman Shan on 07/19/2022 12:03:34

## 2022-07-26 ENCOUNTER — Encounter (HOSPITAL_BASED_OUTPATIENT_CLINIC_OR_DEPARTMENT_OTHER): Payer: Medicare Other | Admitting: Internal Medicine

## 2022-07-26 DIAGNOSIS — I87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: Secondary | ICD-10-CM | POA: Diagnosis not present

## 2022-07-26 DIAGNOSIS — L97822 Non-pressure chronic ulcer of other part of left lower leg with fat layer exposed: Secondary | ICD-10-CM

## 2022-07-26 LAB — GLUCOSE, CAPILLARY: Glucose-Capillary: 151 mg/dL — ABNORMAL HIGH (ref 70–99)

## 2022-07-28 NOTE — Progress Notes (Signed)
SPECIAL, RANES ([id number]) Visit Report for 07/26/2022 Arrival Information Details Patient Name: Laura Santiago, Laura Santiago. Date of Service: 07/26/2022 10:30 AM Medical Record Number: [id number] Patient Account Number: [id number] Date of Birth/Sex: 12/30/1945 (76 y.o. F) Treating RN: Cornell Barman Primary Care Navdeep Fessenden: [facility], [place] Other Clinician: [name] Referring Norvella Loscalzo: [facility] Treating Aaliyana Fredericks/Extender: Yaakov Guthrie in Treatment: 98 Visit Information History Since Last Visit All ordered tests and consults were completed: No Patient Arrived: Wheel Chair Added or deleted any medications: No Arrival Time: 11:16 Any new allergies or adverse reactions: No Transfer Assistance: None Had a fall or experienced change in No Patient Requires Transmission-Based No activities of daily living that may affect Precautions: risk of falls: Patient Has Alerts: Yes Hospitalized since last visit: No Patient Alerts: PT HAS SERVICE Pain Present Now: No ANIMAL ABI 07/11/21 R) 1.16 L) 1.27 Electronic Signature(s) Signed: 07/26/2022 1:47:09 PM By: [name] Entered By: [name] on 07/26/2022 11:28:25 Laura Santiago ([id number]) -------------------------------------------------------------------------------- Clinic Level of Care Assessment Details Patient Name: Laura Santiago. Date of Service: 07/26/2022 10:30 AM Medical Record Number: [id number] Patient Account Number: [id number] Date of Birth/Sex: 1946-10-19 (76 y.o. F) Treating RN: Cornell Barman Primary Care Malek Skog: [facility], [place] Other Clinician: [name] Referring Audray Rumore: [facility] Treating Seleena Reimers/Extender: Yaakov Guthrie in Treatment: 87 Clinic Level of Care Assessment Items TOOL 1 Quantity Score _0  - Use when EandM and Procedure is performed on INITIAL visit 0 ASSESSMENTS - Nursing Assessment / Reassessment _1  - General Physical Exam (combine w/  comprehensive assessment (listed just below) when performed on new 0 pt. evals) _2  - 0 Comprehensive Assessment (HX, ROS, Risk Assessments, Wounds Hx, etc.) ASSESSMENTS - Wound and Skin Assessment / Reassessment _3  - Dermatologic / Skin Assessment (not related to wound area) 0 ASSESSMENTS - Ostomy and/or Continence Assessment and Care _4  - Incontinence Assessment and Management 0 _5  - 0 Ostomy Care Assessment and Management (repouching, etc.) PROCESS - Coordination of Care _6  - Simple Patient / Family Education for ongoing care 0 _7  - 0 Complex (extensive) Patient / Family Education for ongoing care _8  - 0 Staff obtains Programmer, systems, Records, Test Results / Process Orders _9  - 0 Staff telephones HHA, Nursing Homes / Clarify orders / etc _10  - 0 Routine Transfer to another Facility (non-emergent condition) _11  - 0 Routine Hospital Admission (non-emergent condition) _12  - 0 New Admissions / Biomedical engineer / Ordering NPWT, Apligraf, etc. _13  - 0 Emergency Hospital Admission (emergent condition) PROCESS - Special Needs _14  - Pediatric / Minor Patient Management 0 _15  - 0 Isolation Patient Management _16  - 0 Hearing / Language / Visual special needs _17  - 0 Assessment of Community assistance (transportation, D/C planning, etc.) _18  - 0 Additional assistance / Altered mentation _19  - 0 Support Surface(s) Assessment (bed, cushion, seat, etc.) INTERVENTIONS - Miscellaneous _20  - External ear exam 0 _21  - 0 Patient Transfer (multiple staff / Civil Service fast streamer / Similar devices) _22  - 0 Simple Staple / Suture removal (25 or less) _23  - 0 Complex Staple / Suture removal (26 or more) _24  - 0 Hypo/Hyperglycemic Management (do not check if billed separately) _25  - 0 Ankle / Brachial Index (ABI) - do not check if billed separately Has the patient been seen at the hospital within the last three years: Yes Total Score: 0 Level Of Care: ____ Laura Santiago ([id number]) Electronic  Signature(s) Signed: 07/26/2022 1:47:09 PM By: [name] Entered By: [name] on 07/26/2022 11:56:34 Laura Santiago ([id number]) --------------------------------------------------------------------------------  Compression Therapy Details Patient Name: Laura Santiago, Laura Santiago. Date of Service: 07/26/2022 10:30 AM Medical Record Number: [id number] Patient Account Number: [id number] Date of Birth/Sex: 05-26-1946 (76 y.o. F) Treating RN: Cornell Barman Primary Care Ryota Treece: [facility], [place] Other Clinician: [name] Referring Makyna Niehoff: [facility] Treating Havier Deeb/Extender: Yaakov Guthrie in Treatment: 83 Compression Therapy Performed for Wound Assessment: Wound #1 Left,Medial Lower Leg Performed By: [name], [name], Compression Type: Three Layer Pre Treatment ABI: 1.5 Post Procedure Diagnosis Same as Pre-procedure Electronic Signature(s) Signed: 07/26/2022 1:47:09 PM By: [name] Entered By: [name] on 07/26/2022 11:56:09 Laura Santiago ([id number]) -------------------------------------------------------------------------------- Encounter Discharge Information Details Patient Name: Laura Santiago, Laura Santiago. Date of Service: 07/26/2022 10:30 AM Medical Record Number: [id number] Patient Account Number: [id number] Date of Birth/Sex: [birth year] (76 y.o. F) Treating RN: Cornell Barman Primary Care Gurkaran Rahm: [facility], [place] Other Clinician: [name] Referring Rachael Zapanta: [facility] Treating Britiny Defrain/Extender: Yaakov Guthrie in Treatment: 7 Encounter Discharge Information Items Post Procedure Vitals Discharge Condition: Stable Temperature (F): 97.8 Ambulatory Status: Wheelchair Pulse (bpm): 73 Discharge Destination: Home Respiratory Rate (breaths/min): 18 Transportation: Other Blood Pressure (mmHg): 147/70 Accompanied By: self Schedule Follow-up Appointment: No Clinical Summary of Care: Electronic  Signature(s) Signed: 07/26/2022 1:47:09 PM By: [name] Entered By: [name] on 07/26/2022 11:57:24 Laura Santiago ([id number]) -------------------------------------------------------------------------------- Lower Extremity Assessment Details Patient Name: Laura Santiago. Date of Service: 07/26/2022 10:30 AM Medical Record Number: [id number] Patient Account Number: [id number] Date of Birth/Sex: Jul 09, 1946 (76 y.o. F) Treating RN: Cornell Barman Primary Care Aizlyn Schifano: [facility], [place] Other Clinician: [name] Referring Taya Ashbaugh: [facility] Treating Evalene Vath/Extender: Yaakov Guthrie in Treatment: 55 Edema Assessment Assessed: [Left: Yes] [Right: No] [Left: Edema] [Right: :] Calf Left: Right: Point of Measurement: 10 cm From Medial Instep 37.4 cm Ankle Left: Right: Point of Measurement: 39 cm From Medial Instep 24 cm Vascular Assessment Pulses: Dorsalis Pedis Palpable: [Left:Yes] Posterior Tibial Palpable: [Left:Yes] Electronic Signature(s) Signed: 07/26/2022 1:47:09 PM By: [name] Signed: 07/27/2022 7:54:04 AM By: Gretta Cool, BSN, RN, CWS, [name] RN, BSN Entered By: [name] on 07/26/2022 11:41:13 Laura Santiago ([id number]) -------------------------------------------------------------------------------- Multi Wound Chart Details Patient Name: Laura Santiago. Date of Service: 07/26/2022 10:30 AM Medical Record Number: [id number] Patient Account Number: [id number] Date of Birth/Sex: 1946/10/19 (76 y.o. F) Treating RN: Cornell Barman Primary Care Omara Alcon: [facility], [place] Other Clinician: [name] Referring Tonika Eden: [facility] Treating Cambre Matson/Extender: Yaakov Guthrie in Treatment: 25 Vital Signs Height(in): 66 Pulse(bpm): 2 Weight(lbs): 153 Blood Pressure(mmHg): 147/70 Body Mass Index(BMI): 24.7 Temperature(F): 97.8 Respiratory Rate(breaths/min): 18 Photos: [N/A:N/A] Wound Location: Left,  Medial Lower Leg N/A N/A Wounding Event: Gradually Appeared N/A N/A Primary Etiology: Venous Leg Ulcer N/A N/A Comorbid History: Hypertension, Osteoarthritis, N/A N/A Received Chemotherapy, Received Radiation Date Acquired: 04/06/2021 N/A N/A Weeks of Treatment: 55 N/A N/A Wound Status: Open N/A N/A Wound Recurrence: No N/A N/A Clustered Wound: Yes N/A N/A Measurements L x W x D (cm) 9.7x4.5x0.1 N/A N/A Area (cm) : 34.283 N/A N/A Volume (cm) : 3.428 N/A N/A % Reduction in Area: 62.00% N/A N/A % Reduction in Volume: 81.00% N/A N/A Classification: Full Thickness Without Exposed N/A N/A Support Structures Exudate Amount: Medium N/A N/A Exudate Type: Serosanguineous N/A N/A Exudate Color: red, brown N/A N/A Wound Margin: Flat and Intact N/A N/A Granulation Amount: Medium (34-66%) N/A N/A Granulation Quality: Red, Pink N/A N/A Necrotic Amount: Medium (34-66%) N/A N/A Exposed Structures: Fat Layer (Subcutaneous Tissue): N/A N/A Yes Fascia:  No Tendon: No Muscle: No Joint: No Bone: No Epithelialization: Small (1-33%) N/A N/A Treatment Notes Electronic Signature(s) Signed: 07/26/2022 1:47:09 PM By: [name] Entered By: [name] on 07/26/2022 11:41:26 Laura Santiago ([id number]) Laura Santiago, Laura Santiago ([id number]) -------------------------------------------------------------------------------- Multi-Disciplinary Care Plan Details Patient Name: Laura Santiago, Laura Santiago. Date of Service: 07/26/2022 10:30 AM Medical Record Number: [id number] Patient Account Number: [id number] Date of Birth/Sex: 08-18-1946 (76 y.o. F) Treating RN: Cornell Barman Primary Care Davanta Meuser: [facility], [place] Other Clinician: [name] Referring Harding Thomure: [facility] Treating Adore Kithcart/Extender: Yaakov Guthrie in Treatment: 64 Active Inactive Soft Tissue Infection Nursing Diagnoses: Impaired tissue integrity Potential for infection: soft tissue Goals: Patient's soft tissue  infection will resolve Date Initiated: 03/15/2022 Target Resolution Date: 04/27/2022 Goal Status: Active Signs and symptoms of infection will be recognized early to allow for prompt treatment Date Initiated: 03/15/2022 Date Inactivated: 04/26/2022 Target Resolution Date: 04/27/2022 Goal Status: Met Interventions: Assess signs and symptoms of infection every visit Treatment Activities: Culture and sensitivity : 03/15/2022 Notes: Wound/Skin Impairment Nursing Diagnoses: Impaired tissue integrity Goals: Patient/caregiver will verbalize understanding of skin care regimen Date Initiated: 07/06/2021 Date Inactivated: 07/27/2021 Target Resolution Date: 07/06/2021 Goal Status: Met Ulcer/skin breakdown will have a volume reduction of 30% by week 4 Date Initiated: 07/06/2021 Date Inactivated: 10/12/2021 Target Resolution Date: 08/06/2021 Goal Status: Unmet Unmet Reason: cont tx Ulcer/skin breakdown will have a volume reduction of 50% by week 8 Date Initiated: 07/06/2021 Target Resolution Date: 09/06/2021 Goal Status: Active Ulcer/skin breakdown will have a volume reduction of 80% by week 12 Date Initiated: 07/06/2021 Target Resolution Date: 10/06/2021 Goal Status: Active Ulcer/skin breakdown will heal within 14 weeks Date Initiated: 07/06/2021 Target Resolution Date: 11/06/2021 Goal Status: Active Interventions: Assess patient/caregiver ability to obtain necessary supplies Assess patient/caregiver ability to perform ulcer/skin care regimen upon admission and as needed Assess ulceration(s) every visit Treatment Activities: Referred to DME Abdalrahman Clementson for dressing supplies : 07/06/2021 Skin care regimen initiated : 07/06/2021 Notes: TODD, ARGABRIGHT ([id number]) Electronic Signature(s) Signed: 07/26/2022 1:47:09 PM By: [name] Signed: 07/27/2022 7:54:04 AM By: Gretta Cool, BSN, RN, CWS, [name] RN, BSN Entered By: [name] on 07/26/2022 11:41:19 Laura Santiago, Laura Santiago  ([id number]) -------------------------------------------------------------------------------- Pain Assessment Details Patient Name: Laura Santiago, Laura Santiago. Date of Service: 07/26/2022 10:30 AM Medical Record Number: [id number] Patient Account Number: [id number] Date of Birth/Sex: [birth year] (76 y.o. F) Treating RN: Cornell Barman Primary Care Matei Magnone: [facility], [place] Other Clinician: [name] Referring Willmar Stockinger: [facility] Treating Shantavia Jha/Extender: Yaakov Guthrie in Treatment: 75 Active Problems Location of Pain Severity and Description of Pain Patient Has Paino No Site Locations Pain Management and Medication Current Pain Management: Electronic Signature(s) Signed: 07/26/2022 1:47:09 PM By: [name] Signed: 07/27/2022 7:54:04 AM By: Gretta Cool, BSN, RN, CWS, [name] RN, BSN Entered By: [name] on 07/26/2022 11:29:47 Laura Santiago, Laura Santiago ([id number]) -------------------------------------------------------------------------------- Wound Assessment Details Patient Name: Laura Santiago, Laura Santiago. Date of Service: 07/26/2022 10:30 AM Medical Record Number: [id number] Patient Account Number: [id number] Date of Birth/Sex: 1946/08/11 (76 y.o. F) Treating RN: Cornell Barman Primary Care Liah Morr: [facility], [place] Other Clinician: [name] Referring Kaige Whistler: [facility] Treating Deborra Phegley/Extender: Yaakov Guthrie in Treatment: 55 Wound Status Wound Number: 1 Primary Venous Leg Ulcer Etiology: Wound Location: Left, Medial Lower Leg Wound Status: Open Wounding Event: Gradually Appeared Comorbid Hypertension, Osteoarthritis, Received Chemotherapy, Date Acquired: 04/06/2021 History: Received Radiation Weeks Of Treatment: 55 Clustered Wound: Yes Photos Wound Measurements Length: (cm) 9.7 Width: (cm) 4.5 Depth: (cm) 0.1 Area: (cm) 34.283 Volume: (  cm) 3.428 % Reduction in Area: 62% % Reduction in Volume: 81% Epithelialization: Small  (1-33%) Wound Description Classification: Full Thickness Without Exposed Support Structures Wound Margin: Flat and Intact Exudate Amount: Medium Exudate Type: Serosanguineous Exudate Color: red, brown Foul Odor After Cleansing: No Slough/Fibrino Yes Wound Bed Granulation Amount: Medium (34-66%) Exposed Structure Granulation Quality: Red, Pink Fascia Exposed: No Necrotic Amount: Medium (34-66%) Fat Layer (Subcutaneous Tissue) Exposed: Yes Necrotic Quality: Adherent Slough Tendon Exposed: No Muscle Exposed: No Joint Exposed: No Bone Exposed: No Treatment Notes Wound #1 (Lower Leg) Wound Laterality: Left, Medial Cleanser Wound Cleanser Discharge Instruction: Wash your hands with soap and water. Remove old dressing, discard into plastic bag and place into trash. Cleanse the wound with Wound Cleanser prior to applying a clean dressing using gauze sponges, not tissues or cotton balls. Do not scrub or use excessive force. Pat dry using gauze sponges, not tissue or cotton balls. Laura Santiago, Laura Santiago ([id number]) Peri-Wound Care Nystatin Cream USP 30 (g) Discharge Instruction: Use Nystatin Cream as directed. MIx 1:1 with TCA cream and applied to peri wound Triamcinolone Acetonide Cream, 0.1%, 15 (g) tube Discharge Instruction: Apply as directed. mix 1:1 with nystatin cream for peri wound Topical Primary Dressing Aquacel Extra Hydrofiber Dressing, 4x5 (in/in) keystone compound Discharge Instruction: apply to wound bed as directed on medication label PLEASE follow direction on pill bottle for proper mixing Secondary Dressing Zetuvit Absorbent Pad, 4x8 (in/in) Secured With Compression Wrap 3-LAYER WRAP - Profore Lite LF 3 Multilayer Compression Bandaging System Discharge Instruction: Apply 3 multi-layer wrap as prescribed. Compression Stockings Add-Ons Electronic Signature(s) Signed: 07/26/2022 1:47:09 PM By: [name] Signed: 07/27/2022 7:54:04 AM By: Gretta Cool, BSN, RN, CWS, [name] RN,  BSN Entered By: [name] on 07/26/2022 11:40:28 DANEAN, MARNER ([id number]) -------------------------------------------------------------------------------- Vitals Details Patient Name: TAMAKA, SAWIN. Date of Service: 07/26/2022 10:30 AM Medical Record Number: [id number] Patient Account Number: [id number] Date of Birth/Sex: 12/21/1945 (76 y.o. F) Treating RN: Cornell Barman Primary Care Shonika Kolasinski: [facility], [place] Other Clinician: [name] Referring Chandra Asher: [facility], [facility] Treating Jurgen Groeneveld/Extender: Yaakov Guthrie in Treatment: 82 Vital Signs Time Taken: 11:15 Temperature (F): 97.8 Height (in): 66 Pulse (bpm): 73 Weight (lbs): 153 Respiratory Rate (breaths/min): 18 Body Mass Index (BMI): 24.7 Blood Pressure (mmHg): 147/70 Reference Range: 80 - 120 mg / dl Electronic Signature(s) Signed: 07/26/2022 1:47:09 PM By: [name] Entered By: [name] on 07/26/2022 11:28:51

## 2022-07-28 NOTE — Progress Notes (Signed)
Laura, Santiago ([id number]) Visit Report for 07/26/2022 Chief Complaint Document Details Patient Name: Laura, Santiago. Date of Service: 07/26/2022 10:30 AM Medical Record Number: [id number] Patient Account Number: [id number] Date of Birth/Sex: [birth year] (76 y.o. F) Treating RN: Cornell Barman Primary Care Provider: Hilda Lias, [place] Other Clinician: [name] Referring Provider: [facility] Treating Provider/Extender: Yaakov Guthrie in Treatment: 23 Information Obtained from: Patient Chief Complaint Left lower extremity wound Right toe wounds Left upper lateral thigh wounds Electronic Signature(s) Signed: 07/26/2022 1:41:56 PM By: Kalman Shan DO Entered By: Kalman Shan on 07/26/2022 12:23:11 Laura Santiago ([id number]) -------------------------------------------------------------------------------- Debridement Details Patient Name: Laura Santiago. Date of Service: 07/26/2022 10:30 AM Medical Record Number: [id number] Patient Account Number: [id number] Date of Birth/Sex: 1946-10-13 (76 y.o. F) Treating RN: Cornell Barman Primary Care Provider: Hilda Lias, [place] Other Clinician: [name] Referring Provider: [facility] Treating Provider/Extender: Yaakov Guthrie in Treatment: 55 Debridement Performed for Wound #1 Left,Medial Lower Leg Assessment: Performed By: Physician Kalman Shan, MD Debridement Type: Debridement Severity of Tissue Pre Debridement: Fat layer exposed Level of Consciousness (Pre- Awake and Alert procedure): Pre-procedure Verification/Time Out Yes - 11:51 Taken: Start Time: 11:51 Total Area Debrided (L x W): 9.7 (cm) x 4.5 (cm) = 43.65 (cm) Tissue and other material Viable, Non-Viable, Slough, Subcutaneous, Slough debrided: Level: Skin/Subcutaneous Tissue Debridement Description: Excisional Instrument: Curette Bleeding: Minimum Hemostasis Achieved: Pressure End Time: 11:55 Response to  Treatment: Procedure was tolerated well Level of Consciousness (Post- Awake and Alert procedure): Post Debridement Measurements of Total Wound Length: (cm) 9.7 Width: (cm) 4.5 Depth: (cm) 0.1 Volume: (cm) 3.428 Character of Wound/Ulcer Post Debridement: Stable Severity of Tissue Post Debridement: Fat layer exposed Post Procedure Diagnosis Same as Pre-procedure Electronic Signature(s) Signed: 07/26/2022 1:41:56 PM By: Kalman Shan DO Signed: 07/26/2022 1:47:09 PM By: [name] Signed: 07/27/2022 7:54:04 AM By: Gretta Cool, BSN, RN, CWS, [name] RN, BSN Entered By: [name] on 07/26/2022 11:55:34 Laura Santiago ([id number]) -------------------------------------------------------------------------------- HPI Details Patient Name: Laura Santiago. Date of Service: 07/26/2022 10:30 AM Medical Record Number: [id number] Patient Account Number: [id number] Date of Birth/Sex: 1946/11/03 (76 y.o. F) Treating RN: Cornell Barman Primary Care Provider: Hilda Lias, [place] Other Clinician: [name] Referring Provider: [facility] Treating Provider/Extender: Yaakov Guthrie in Treatment: 105 History of Present Illness HPI Description: Admission 7/27 Laura Santiago is a 76 year old female with a past medical history of ADHD, metastatic breast cancer, stage IV chronic kidney disease, history of DVT on Xarelto and chronic venous insufficiency that presents to the clinic for a chronic left lower extremity wound. She recently moved to [facility] 4 days ago. She was being followed by wound care center in [name]. She reports a 10-year history of wounds to her left lower extremity that eventually do heal with debridement and compression therapy. She states that the current wound reopened 4 months ago and she is using Vaseline and Coban. She denies signs of infection. 8/3; patient presents for 1 week follow-up. She reports no issues or complaints today. She states  she had vascular studies done in the last week. She denies signs of infection. She brought her little service dog with her today. 8/17; patient presents for follow-up. She has missed her last clinic appointment. She states she took the wrap off and attempted to rewrap her leg. She is having difficulty with transportation. She has her service dog with her today. Overall she feels well and reports improvement in wound healing. She denies signs  of infection. She reports owning an old Velcro wrap compression and has this at her living facility 9/14; patient presents for follow-up. Patient states that over the past 2 to 3 weeks she developed toe wounds to her right foot. She attributes this to tight fitting shoes. She subsequently developed cellulitis in the right leg and has been treated by doxycycline by her oncologist. She reports improvement in symptoms however continues to have some redness and swelling to this leg. To the left lower extremity patient has been having her wraps changed with home health twice weekly. She states that the [facility] is not helping control the drainage. Other than that she has no issues or complaints today. She denies signs of infection to the left lower extremity. 9/21; patient presents for follow-up. She reports seeing infectious disease for her cellulitis. She reports no further management. She has home health that changes the wraps twice weekly. She has no issues or complaints today. She denies signs of infection. 10/5; patient presents for follow-up. She has no issues or complaints today. She denies signs of infection. She states that the right great toe has not been dressed by home health. 10/12; patient presents for follow-up. She has no issues or complaints today. She reports improvement in her wound healing. She has been using silver alginate to the right great toe wound. She denies signs of infection. 10/26; patient presents for follow-up. Home health did  not have sorbact so they continued to use Hydrofera Blue under the wrap. She has been using silver alginate to the great toe wound however she did not have a dressing in place today. She currently denies signs of infection. 11/2; patient presents for follow-up. She has been using sorb act under the compression wrap. She reports using silver alginate to the toe wound again she does not have a dressing in place. She currently denies signs of infection. 11/23; patient presents for follow-up. Unfortunately she has missed her last 2 clinic appointments. She was last seen 3 weeks ago. She did her own compression wrap with Kerlix and Coban yesterday after seeing vein and vascular. She has not been dressing her right great toe wound. She currently denies signs of infection. 11/30; patient presents for 1 week follow-up. She states she changed her dressing last week prior to home health and use sorb act with Dakin's and Hydrofera Blue. Home health has changed the dressing as well and they have been using sorbact. Today she reports increased redness to her right lower extremity. She has a history of cellulitis to this leg. She has been using silver alginate to the right great toe. Unfortunately she had an episode of diarrhea prior to coming in and had feces all over the right leg and to the wrap of her left leg. 12/7; patient presents for 1 week follow-up. She states that home health did not come out to change the dressing and she took it off yesterday. It is unclear if she is dressing the right toe wound. She denies signs of infection. 12/14; patient presents for 1 week follow-up. She has no issues or complaints today. 12/21; patient presents for follow-up. She has no issues or complaints today. She denies signs of infection. 12/28/2021; patient presents for follow-up. She was hospitalized for sepsis secondary to right lower extremity cellulitis On 12/23. She states she is currently at a SNF. She states that  she was started on doxycycline this morning for her right great toe swelling and redness. She is not sure what dressings  have been done to her left lower extremity for the past 3 weeks. She says its been mainly gauze with an Ace wrap. 1/25; patient presents for follow-up. She is still residing in a skilled nursing facility. She reports mild pain to the left lower extremity wound bed. She states she is going to see a podiatrist soon. 2/8; patient presents for follow-up. She has moved back to her residential community from her skilled nursing facility. She has no issues or complaints today. She denies signs of systemic infections. 2/15; patient presents for follow-up. He has no issues or complaints today. She denies systemic signs of infection. 2/22; patient presents for follow-up. She has no issues or complaints today. She denies signs of infection. Laura Santiago, Laura Santiago ([id number]) 3/1; patient presents for follow-up. She states that home health came out the day after she was seen in our clinic and yesterday to do the wrap change. She denies signs of infection. She reports excoriated skin on the ankle. 3/8; patient presents for follow-up. She has no issues or complaints today. She denies signs of infection. 3/15; patient presents for follow-up. Home health has been coming out to change the dressings. She reports more tenderness to the wound site. She denies purulent drainage, increased warmth or erythema to the area. 4/5; patient presents for follow-up. She has missed her last 2 clinic appointments. I have not seen her in 3 weeks. She was recently hospitalized for altered mental status. She was involuntarily committed. She was evaluated by psychiatry and deemed to have competency. There was no specific cause of her altered mental status. It was concluded that her physical and mental health were declining due to her chronic medical conditions. Currently home health has been coming out for dressing  changes. Patient has also been doing her own dressing changes. She reports more skin breakdown to the periwound and now has a new wound. She denies fever/chills. She reports continued tenderness to the wound site. 4/12; patient with significant venous insufficiency and a large wound on her left lower leg taking up about 80% of the circumference of her lower leg. Cultures of this grew MRSA and Pseudomonas. She had completed a course of ciprofloxacin now is starting doxycycline. She has been using Dakin's wet-to-dry and a Tubigrip. She has home health twice a week and we change it once. 4/19; patient presents for follow-up. She completed her course of doxycycline. She has been using Dakin's wet-to-dry dressing and Tubigrip. Home health changes the dressing twice weekly. Currently she has no issues or complaints. 4/26; patient presents for follow-up. At last clinic visit orders for home health were Iodosorb under compression therapy. Unfortunately they did not have the dressing and have been using Dakin's and gentamicin under the wrap. Patient currently denies signs of infection. She has no issues or complaints today. 5/3; patient presents for follow-up. Again Iodosorb has not been used under the compression therapy when home health comes out to change the wrap and dressing. They have been using Sorbact. It is unclear why this is happening since we send orders weekly to the agency. She denies signs of infection. Patient has not purchased the [place] antibiotics. We reached out to the company and they said they have been trying to contact her on a regular basis. We gave the patient the number to call to order the medication. 5/10; patient presents for follow-up. She has no issues or complaints today. Again home health has not been using Iodosorb. Mepilex was on the wound bed. No other dressings  noted. She brought in her Keystone antibiotics. She denies signs of infection. 5/17; patient presents for  follow-up. Home health has come out twice since she was last seen. Joint well she has been using Keystone antibiotic with Sorbact under the compression wrap. She has no issues or complaints today. She denies signs of infection. 5/24; patient presents for follow-up. We have been using Keystone antibiotics with Sorbact under compression therapy. She is tolerating the treatment well. She is reporting improvement in wound healing. She denies signs of infection. 5/31; patient presents for follow-up. We continue to do [facility] antibiotics with Sorbact under compression therapy. She continues to report improvement in wound healing. Home health comes out and changes the dressing once weekly. 05-17-2022 upon evaluation today patient appears to be doing better in regard to her wound especially compared to the last time I saw her. Fortunately I do think that she is seeing improvements. With that being said I do believe that she may be benefit from sharp debridement today to clear away some of the necrotic debris I discussed that with her as well. She is an amendable to that plan. Otherwise she is very pleased with how the [employer] is doing for her. 6/14; patient presents for follow-up. We have been using Keystone antibiotic with Sorbact and absorbent dressings under 3 layer compression. She has no issues or complaints today. She reports improvement in wound healing. She denies signs of infection. 6/21; patient presents for follow-up. We are continuing with [facility] antibiotic and Sorbact under 3 layer compression. Patient has no complaints. Continued wound healing is happening. She denies signs of infection. 6/28; patient presents for follow-up. We have been using Keystone antibiotic with Sorbact under 3 layer compression. Usually home health comes out and changes the dressing twice a week. Unfortunately they did not go out to change the dressing. It is unclear why. Patient did not call them. She currently denies  signs of infection. 7/5; patient presents for follow-up. We have been using Keystone antibiotic with calcium alginate under 3 layer compression. She reports improvement in wound healing. She denies signs of infection. Home health has come out to do dressing changes twice this past week. 7/12; patient presents for follow-up. We have been using Keystone antibiotic with calcium alginate under 3 layer compression. Patient states that home health came out once last week to change the dressing. She reports improvement in wound healing. She currently denies signs of infection. 7/19; patient presents for follow-up. We have been using Keystone antibiotic with calcium alginate under 3 layer compression. Home health came out once last week to change the dressing. She has no issues or complaints today. She denies signs of infection. 8/2; patient presents for follow-up. We have been using Keystone antibiotic with calcium alginate under 3 layer compression. Unfortunately she missed her appointment last week and home health did not come out to do dressing changes. Patient currently denies signs of infection. 8/9; patient presents for follow-up. We have been using Keystone with calcium alginate under 3 layer compression. She states that home health came out once last week. She currently denies signs of infection. Her wrap was completely wet. She states she was cleaning the top of the leg and water soaked down into the wrap. 8/16; patient presents for follow-up. We have been using Keystone with calcium alginate under 3 layer compression. She states that home health came out twice last week. She has no issues or complaints today. Electronic Signature(s) Signed: 07/26/2022 1:41:56 PM By: Kalman Shan DO Laura Santiago,  Laura Santiago ([id number]) Entered By: Kalman Shan on 07/26/2022 12:23:37 Laura Santiago ([id number]) -------------------------------------------------------------------------------- Physical Exam  Details Patient Name: Laura Santiago, Laura Santiago. Date of Service: 07/26/2022 10:30 AM Medical Record Number: [id number] Patient Account Number: [id number] Date of Birth/Sex: 08-23-1946 (76 y.o. F) Treating RN: Cornell Barman Primary Care Provider: Hilda Lias, [place] Other Clinician: [name] Referring Provider: [facility] Treating Provider/Extender: Yaakov Guthrie in Treatment: 24 Constitutional . Cardiovascular . Psychiatric . Notes Left lower extremity: Large open wound with granulation tissue and nonviable tissue. No signs of surrounding soft tissue infection. Electronic Signature(s) Signed: 07/26/2022 1:41:56 PM By: Kalman Shan DO Entered By: Kalman Shan on 07/26/2022 12:23:56 Laura Santiago ([id number]) -------------------------------------------------------------------------------- Physician Orders Details Patient Name: Laura Santiago. Date of Service: 07/26/2022 10:30 AM Medical Record Number: [id number] Patient Account Number: [id number] Date of Birth/Sex: 04/30/1946 (76 y.o. F) Treating RN: Cornell Barman Primary Care Provider: Hilda Lias, [place] Other Clinician: [name] Referring Provider: [facility] Treating Provider/Extender: Yaakov Guthrie in Treatment: 2 Verbal / Phone Orders: No Diagnosis Coding Follow-up Appointments o Return Appointment in 1 week. o Nurse Visit as needed [name] for wound care. May utilize formulary equivalent dressing for wound treatment orders unless otherwise specified. Home Health Nurse may visit PRN to address patientos wound care needs. o **Please direct any NON-WOUND related issues/requests for orders to patient's Primary Care Physician. **If current dressing causes regression in wound condition, may D/C ordered dressing product/s and apply Normal Saline Moist Dressing daily until next [place] or Other  MD appointment. **Notify Wound Healing Center of regression in wound condition at [phone]. o Other Home Health Orders/Instructions: - Dressing change 3 x weekly, twice by home health and once at wound clinic weekly. PLEASE make sure frequency between dressing changes is appropriate. Bathing/ Shower/ Hygiene o May shower with wound dressing protected with water repellent cover or cast protector. o No tub bath. Anesthetic (Use 'Patient Medications' Section for Anesthetic Order Entry) o Lidocaine applied to wound bed Edema Control - Lymphedema / Segmental Compressive Device / Other o Optional: One layer of unna paste to top of compression wrap (to act as an anchor). - PLEASE when applying wrap start from toes and go up to just below knee o Elevate, Exercise Daily and Avoid Standing for Long Periods of Time. o Elevate legs to the level of the heart and pump ankles as often as possible o Elevate leg(s) parallel to the floor when sitting. o DO YOUR BEST to sleep in the bed at night. DO NOT sleep in your recliner. Long hours of sitting in a recliner leads to swelling of the legs and/or potential wounds on your backside. Additional Orders / Instructions o Follow Nutritious Diet and Increase Protein Intake Wound Treatment Wound #1 - Lower Leg Wound Laterality: Left, Medial Cleanser: Wound Cleanser (Home Health) 3 x Per Week/30 Days Discharge Instructions: Wash your hands with soap and water. Remove old dressing, discard into plastic bag and place into trash. Cleanse the wound with Wound Cleanser prior to applying a clean dressing using gauze sponges, not tissues or cotton balls. Do not scrub or use excessive force. Pat dry using gauze sponges, not tissue or cotton balls. Peri-Wound Care: Nystatin Cream USP 30 (g) 3 x Per Week/30 Days Discharge Instructions: Use Nystatin Cream as directed. MIx 1:1 with TCA cream and applied to peri wound Peri-Wound Care: Triamcinolone  Acetonide Cream, 0.1%, 15 (g) tube  3 x Per Week/30 Days Discharge Instructions: Apply as directed. mix 1:1 with nystatin cream for peri wound Primary Dressing: Aquacel Extra Hydrofiber Dressing, 4x5 (in/in) 3 x Per Week/30 Days Primary Dressing: keystone compound 3 x Per Week/30 Days Discharge Instructions: apply to wound bed as directed on medication label PLEASE follow direction on pill bottle for proper mixing Secondary Dressing: Zetuvit Absorbent Pad, 4x8 (in/in) 3 x Per Week/30 Days Compression Wrap: 3-LAYER WRAP - Profore Lite LF 3 Multilayer Compression Bandaging System 3 x Per Week/30 Days Discharge Instructions: Apply 3 multi-layer wrap as prescribed. Laura Santiago, Laura Santiago ([id number]) Electronic Signature(s) Signed: 07/26/2022 1:41:56 PM By: Kalman Shan DO Entered By: Kalman Shan on 07/26/2022 12:28:19 Laura Santiago, Laura Santiago ([id number]) -------------------------------------------------------------------------------- Problem List Details Patient Name: Laura Santiago, Laura Santiago. Date of Service: 07/26/2022 10:30 AM Medical Record Number: [id number] Patient Account Number: [id number] Date of Birth/Sex: 1946/09/23 (76 y.o. F) Treating RN: Cornell Barman Primary Care Provider: Hilda Lias, [place] Other Clinician: [name] Referring Provider: [facility] Treating Provider/Extender: Yaakov Guthrie in Treatment: 4 Active Problems ICD-10 Encounter Code Description Active Date MDM Diagnosis L97.822 Non-pressure chronic ulcer of other part of left lower leg with fat layer 11/02/2021 No Yes exposed I87.312 Chronic venous hypertension (idiopathic) with ulcer of left lower 11/02/2021 No Yes extremity I87.2 Venous insufficiency (chronic) (peripheral) 07/06/2021 No Yes Z79.01 Long term (current) use of anticoagulants 07/06/2021 No Yes I10 Essential (primary) hypertension 07/06/2021 No Yes 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 07/06/2021 No Yes Inactive  Problems ICD-10 Code Description Active Date Inactive Date S81.802A Unspecified open wound, left lower leg, initial encounter 07/06/2021 07/06/2021 S91.101A Unspecified open wound of right great toe without damage to nail, initial 08/24/2021 08/24/2021 encounter S91.104A Unspecified open wound of right lesser toe(s) without damage to nail, initial 08/24/2021 08/24/2021 encounter Resolved Problems ICD-10 Code Description Active Date Resolved Date S91.104D Unspecified open wound of right lesser toe(s) without damage to nail, 08/31/2021 08/31/2021 subsequent encounter Laura Santiago, DESENA ([id number]) S91.201D Unspecified open wound of right great toe with damage to nail, subsequent 08/31/2021 08/31/2021 encounter Electronic Signature(s) Signed: 07/26/2022 1:41:56 PM By: Kalman Shan DO Entered By: Kalman Shan on 07/26/2022 12:23:06 Laura Santiago ([id number]) -------------------------------------------------------------------------------- Progress Note Details Patient Name: Laura Santiago. Date of Service: 07/26/2022 10:30 AM Medical Record Number: [id number] Patient Account Number: [id number] Date of Birth/Sex: 05/14/1946 (76 y.o. F) Treating RN: Cornell Barman Primary Care Provider: [facility], [place] Other Clinician: [name] Referring Provider: [facility] Treating Provider/Extender: Yaakov Guthrie in Treatment: 39 Subjective Chief Complaint Information obtained from Patient Left lower extremity wound Right toe wounds Left upper lateral thigh wounds History of Present Illness (HPI) Admission 7/27 Ms. Ervin Rothbauer is a 76 year old female with a past medical history of ADHD, metastatic breast cancer, stage IV chronic kidney disease, history of DVT on Xarelto and chronic venous insufficiency that presents to the clinic for a chronic left lower extremity wound. She recently moved to [facility] 4 days ago. She was being followed by wound care  center in [name]. She reports a 10-year history of wounds to her left lower extremity that eventually do heal with debridement and compression therapy. She states that the current wound reopened 4 months ago and she is using Vaseline and Coban. She denies signs of infection. 8/3; patient presents for 1 week follow-up. She reports no issues or complaints today. She states she had vascular studies done in the last week. She denies signs of infection. She brought her little service  dog with her today. 8/17; patient presents for follow-up. She has missed her last clinic appointment. She states she took the wrap off and attempted to rewrap her leg. She is having difficulty with transportation. She has her service dog with her today. Overall she feels well and reports improvement in wound healing. She denies signs of infection. She reports owning an old Velcro wrap compression and has this at her living facility 9/14; patient presents for follow-up. Patient states that over the past 2 to 3 weeks she developed toe wounds to her right foot. She attributes this to tight fitting shoes. She subsequently developed cellulitis in the right leg and has been treated by doxycycline by her oncologist. She reports improvement in symptoms however continues to have some redness and swelling to this leg. To the left lower extremity patient has been having her wraps changed with home health twice weekly. She states that the [facility] [place] is not helping control the drainage. Other than that she has no issues or complaints today. She denies signs of infection to the left lower extremity. 9/21; patient presents for follow-up. She reports seeing infectious disease for her cellulitis. She reports no further management. She has home health that changes the wraps twice weekly. She has no issues or complaints today. She denies signs of infection. 10/5; patient presents for follow-up. She has no issues or complaints today. She denies  signs of infection. She states that the right great toe has not been dressed by home health. 10/12; patient presents for follow-up. She has no issues or complaints today. She reports improvement in her wound healing. She has been using silver alginate to the right great toe wound. She denies signs of infection. 10/26; patient presents for follow-up. Home health did not have sorbact so they continued to use Hydrofera Blue under the wrap. She has been using silver alginate to the great toe wound however she did not have a dressing in place today. She currently denies signs of infection. 11/2; patient presents for follow-up. She has been using sorb act under the compression wrap. She reports using silver alginate to the toe wound again she does not have a dressing in place. She currently denies signs of infection. 11/23; patient presents for follow-up. Unfortunately she has missed her last 2 clinic appointments. She was last seen 3 weeks ago. She did her own compression wrap with Kerlix and Coban yesterday after seeing vein and vascular. She has not been dressing her right great toe wound. She currently denies signs of infection. 11/30; patient presents for 1 week follow-up. She states she changed her dressing last week prior to home health and use sorb act with Dakin's and Hydrofera Blue. Home health has changed the dressing as well and they have been using sorbact. Today she reports increased redness to her right lower extremity. She has a history of cellulitis to this leg. She has been using silver alginate to the right great toe. Unfortunately she had an episode of diarrhea prior to coming in and had feces all over the right leg and to the wrap of her left leg. 12/7; patient presents for 1 week follow-up. She states that home health did not come out to change the dressing and she took it off yesterday. It is unclear if she is dressing the right toe wound. She denies signs of infection. 12/14;  patient presents for 1 week follow-up. She has no issues or complaints today. 12/21; patient presents for follow-up. She has no issues or complaints  today. She denies signs of infection. 12/28/2021; patient presents for follow-up. She was hospitalized for sepsis secondary to right lower extremity cellulitis On 12/23. She states she is currently at a SNF. She states that she was started on doxycycline this morning for her right great toe swelling and redness. She is not sure what dressings have been done to her left lower extremity for the past 3 weeks. She says its been mainly gauze with an Ace wrap. 1/25; patient presents for follow-up. She is still residing in a skilled nursing facility. She reports mild pain to the left lower extremity wound bed. She states she is going to see a podiatrist soon. 2/8; patient presents for follow-up. She has moved back to her residential community from her skilled nursing facility. She has no issues or complaints today. She denies signs of systemic infections. Laura Santiago, Laura Santiago ([id number]) 2/15; patient presents for follow-up. He has no issues or complaints today. She denies systemic signs of infection. 2/22; patient presents for follow-up. She has no issues or complaints today. She denies signs of infection. 3/1; patient presents for follow-up. She states that home health came out the day after she was seen in our clinic and yesterday to do the wrap change. She denies signs of infection. She reports excoriated skin on the ankle. 3/8; patient presents for follow-up. She has no issues or complaints today. She denies signs of infection. 3/15; patient presents for follow-up. Home health has been coming out to change the dressings. She reports more tenderness to the wound site. She denies purulent drainage, increased warmth or erythema to the area. 4/5; patient presents for follow-up. She has missed her last 2 clinic appointments. I have not seen her in 3 weeks. She  was recently hospitalized for altered mental status. She was involuntarily committed. She was evaluated by psychiatry and deemed to have competency. There was no specific cause of her altered mental status. It was concluded that her physical and mental health were declining due to her chronic medical conditions. Currently home health has been coming out for dressing changes. Patient has also been doing her own dressing changes. She reports more skin breakdown to the periwound and now has a new wound. She denies fever/chills. She reports continued tenderness to the wound site. 4/12; patient with significant venous insufficiency and a large wound on her left lower leg taking up about 80% of the circumference of her lower leg. Cultures of this grew MRSA and Pseudomonas. She had completed a course of ciprofloxacin now is starting doxycycline. She has been using Dakin's wet-to-dry and a Tubigrip. She has home health twice a week and we change it once. 4/19; patient presents for follow-up. She completed her course of doxycycline. She has been using Dakin's wet-to-dry dressing and Tubigrip. Home health changes the dressing twice weekly. Currently she has no issues or complaints. 4/26; patient presents for follow-up. At last clinic visit orders for home health were Iodosorb under compression therapy. Unfortunately they did not have the dressing and have been using Dakin's and gentamicin under the wrap. Patient currently denies signs of infection. She has no issues or complaints today. 5/3; patient presents for follow-up. Again Iodosorb has not been used under the compression therapy when home health comes out to change the wrap and dressing. They have been using Sorbact. It is unclear why this is happening since we send orders weekly to the agency. She denies signs of infection. Patient has not purchased the [place] antibiotics. We reached out to the  company and they said they have been trying to contact  her on a regular basis. We gave the patient the number to call to order the medication. 5/10; patient presents for follow-up. She has no issues or complaints today. Again home health has not been using Iodosorb. Mepilex was on the wound bed. No other dressings noted. She brought in her Keystone antibiotics. She denies signs of infection. 5/17; patient presents for follow-up. Home health has come out twice since she was last seen. Joint well she has been using Keystone antibiotic with Sorbact under the compression wrap. She has no issues or complaints today. She denies signs of infection. 5/24; patient presents for follow-up. We have been using Keystone antibiotics with Sorbact under compression therapy. She is tolerating the treatment well. She is reporting improvement in wound healing. She denies signs of infection. 5/31; patient presents for follow-up. We continue to do [facility] antibiotics with Sorbact under compression therapy. She continues to report improvement in wound healing. Home health comes out and changes the dressing once weekly. 05-17-2022 upon evaluation today patient appears to be doing better in regard to her wound especially compared to the last time I saw her. Fortunately I do think that she is seeing improvements. With that being said I do believe that she may be benefit from sharp debridement today to clear away some of the necrotic debris I discussed that with her as well. She is an amendable to that plan. Otherwise she is very pleased with how the [employer] is doing for her. 6/14; patient presents for follow-up. We have been using Keystone antibiotic with Sorbact and absorbent dressings under 3 layer compression. She has no issues or complaints today. She reports improvement in wound healing. She denies signs of infection. 6/21; patient presents for follow-up. We are continuing with [facility] antibiotic and Sorbact under 3 layer compression. Patient has no complaints. Continued  wound healing is happening. She denies signs of infection. 6/28; patient presents for follow-up. We have been using Keystone antibiotic with Sorbact under 3 layer compression. Usually home health comes out and changes the dressing twice a week. Unfortunately they did not go out to change the dressing. It is unclear why. Patient did not call them. She currently denies signs of infection. 7/5; patient presents for follow-up. We have been using Keystone antibiotic with calcium alginate under 3 layer compression. She reports improvement in wound healing. She denies signs of infection. Home health has come out to do dressing changes twice this past week. 7/12; patient presents for follow-up. We have been using Keystone antibiotic with calcium alginate under 3 layer compression. Patient states that home health came out once last week to change the dressing. She reports improvement in wound healing. She currently denies signs of infection. 7/19; patient presents for follow-up. We have been using Keystone antibiotic with calcium alginate under 3 layer compression. Home health came out once last week to change the dressing. She has no issues or complaints today. She denies signs of infection. 8/2; patient presents for follow-up. We have been using Keystone antibiotic with calcium alginate under 3 layer compression. Unfortunately she missed her appointment last week and home health did not come out to do dressing changes. Patient currently denies signs of infection. 8/9; patient presents for follow-up. We have been using Keystone with calcium alginate under 3 layer compression. She states that home health came out once last week. She currently denies signs of infection. Her wrap was completely wet. She states she was cleaning the  top of the leg and water soaked down into the wrap. 8/16; patient presents for follow-up. We have been using Keystone with calcium alginate under 3 layer compression. She states that  home health came out twice last week. She has no issues or complaints today. Laura Santiago, Laura Santiago ([id number]) Objective Constitutional Vitals Time Taken: 11:15 AM, Height: 66 in, Weight: 153 lbs, BMI: 24.7, Temperature: 97.8 F, Pulse: 73 bpm, Respiratory Rate: 18 breaths/min, Blood Pressure: 147/70 mmHg. General Notes: Left lower extremity: Large open wound with granulation tissue and nonviable tissue. No signs of surrounding soft tissue infection. Integumentary (Hair, Skin) Wound #1 status is Open. Original cause of wound was Gradually Appeared. The date acquired was: 04/06/2021. The wound has been in treatment 55 weeks. The wound is located on the Left,Medial Lower Leg. The wound measures 9.7cm length x 4.5cm width x 0.1cm depth; 34.283cm^2 area and 3.428cm^3 volume. There is Fat Layer (Subcutaneous Tissue) exposed. There is a medium amount of serosanguineous drainage noted. The wound margin is flat and intact. There is medium (34-66%) red, pink granulation within the wound bed. There is a medium (34-66%) amount of necrotic tissue within the wound bed including Adherent Slough. 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 I debrided nonviable tissue. I recommended continuing the course with [facility] antibiotic and calcium alginate under 3 layer compression. Fortunately home health is back and changing her dressings twice a week. This has made a big difference. I asked the patient to make sure to call us or home health if they do not come out. Follow-up in 1 week. Procedures Wound #1 Pre-procedure  diagnosis of Wound #1 is a Venous Leg Ulcer located on the Left,Medial Lower Leg .Severity of Tissue Pre Debridement is: Fat layer exposed. There was a Excisional Skin/Subcutaneous Tissue Debridement with a total area of 43.65 sq cm performed by Kalman Shan, MD. With the following instrument(s): Curette to remove Viable and Non-Viable tissue/material. Material removed includes Subcutaneous Tissue and Slough and. A time out was conducted at 11:51, prior to the start of the procedure. A Minimum amount of bleeding was controlled with Pressure. The procedure was tolerated well. Post Debridement Measurements: 9.7cm length x 4.5cm width x 0.1cm depth; 3.428cm^3 volume. Character of Wound/Ulcer Post Debridement is stable. Severity of Tissue Post Debridement is: Fat layer exposed. 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 There was a Three Layer Compression Therapy Procedure with a pre-treatment ABI of 1.5 by [name]. Post procedure Diagnosis Wound #1: Same as Pre-Procedure Plan Laura Santiago, Laura Santiago ([id number]) Follow-up Appointments: Return Appointment in 1 week. Nurse Visit as needed Home Health: [place]: - [place] for wound care. May utilize formulary equivalent dressing for wound treatment orders unless otherwise specified. Home Health Nurse may visit PRN to address patient s wound care needs. **Please direct any NON-WOUND related issues/requests for orders to patient's Primary Care Physician. **If current dressing causes regression in wound condition, may D/C ordered dressing product/s and apply Normal Saline Moist Dressing daily until next [place] or Other MD appointment. **Notify [facility] of regression in  wound condition at [phone]. Other Home Health Orders/Instructions: - Dressing change 3 x weekly, twice by  home health and once at wound clinic weekly. PLEASE make sure frequency between dressing changes is appropriate. Bathing/ Shower/ Hygiene: May shower with wound dressing protected with water repellent cover or cast protector. No tub bath. Anesthetic (Use 'Patient Medications' Section for Anesthetic Order Entry): Lidocaine applied to wound bed Edema Control - Lymphedema / Segmental Compressive Device / Other: Optional: One layer of unna paste to top of compression wrap (to act as an anchor). - PLEASE when applying wrap start from toes and go up to just below knee Elevate, Exercise Daily and Avoid Standing for Long Periods of Time. Elevate legs to the level of the heart and pump ankles as often as possible Elevate leg(s) parallel to the floor when sitting. DO YOUR BEST to sleep in the bed at night. DO NOT sleep in your recliner. Long hours of sitting in a recliner leads to swelling of the legs and/or potential wounds on your backside. Additional Orders / Instructions: Follow Nutritious Diet and Increase Protein Intake WOUND #1: - Lower Leg Wound Laterality: Left, Medial Cleanser: Wound Cleanser (Home Health) 3 x Per Week/30 Days Discharge Instructions: Wash your hands with soap and water. Remove old dressing, discard into plastic bag and place into trash. Cleanse the wound with Wound Cleanser prior to applying a clean dressing using gauze sponges, not tissues or cotton balls. Do not scrub or use excessive force. Pat dry using gauze sponges, not tissue or cotton balls. Peri-Wound Care: Nystatin Cream USP 30 (g) 3 x Per Week/30 Days Discharge Instructions: Use Nystatin Cream as directed. MIx 1:1 with TCA cream and applied to peri wound Peri-Wound Care: Triamcinolone Acetonide Cream, 0.1%, 15 (g) tube 3 x Per Week/30 Days Discharge Instructions: Apply as directed. mix 1:1 with nystatin cream for peri wound Primary Dressing: Aquacel Extra Hydrofiber Dressing, 4x5 (in/in) 3 x Per Week/30  Days Primary Dressing: keystone compound 3 x Per Week/30 Days Discharge Instructions: apply to wound bed as directed on medication label PLEASE follow direction on pill bottle for proper mixing Secondary Dressing: Zetuvit Absorbent Pad, 4x8 (in/in) 3 x Per Week/30 Days Compression Wrap: 3-LAYER WRAP - Profore Lite LF 3 Multilayer Compression Bandaging System 3 x Per Week/30 Days Discharge Instructions: Apply 3 multi-layer wrap as prescribed. 1. In office sharp debridement 2. Calcium alginate and Keystone under 3 layer compression 3. Follow-up in 1 week Electronic Signature(s) Signed: 07/26/2022 1:41:56 PM By: Kalman Shan DO Entered By: Kalman Shan on 07/26/2022 12:27:13 Laura Santiago ([id number]) -------------------------------------------------------------------------------- ROS/PFSH Details Patient Name: Laura Santiago. Date of Service: 07/26/2022 10:30 AM Medical Record Number: [id number] Patient Account Number: [id number] Date of Birth/Sex: 05/24/1946 (76 y.o. F) Treating RN: Cornell Barman Primary Care Provider: Hilda Lias, [place] Other Clinician: [name] Referring Provider: [facility] Treating Provider/Extender: Yaakov Guthrie in Treatment: 57 Information Obtained From Patient Eyes Medical History: Negative for: Cataracts; Glaucoma; Optic Neuritis Ear/Nose/Mouth/Throat Medical History: Negative for: Chronic sinus problems/congestion; Middle ear problems Hematologic/Lymphatic Medical History: Negative for: Anemia; Hemophilia; Human Immunodeficiency Virus; Lymphedema; Sickle Cell Disease Respiratory Medical History: Negative for: Aspiration; Asthma; Chronic Obstructive Pulmonary Disease (COPD); Pneumothorax; Sleep Apnea; Tuberculosis Cardiovascular Medical History: Positive for: Hypertension Negative for: Angina; Arrhythmia; Congestive Heart Failure; Coronary Artery Disease; Deep Vein Thrombosis; Hypotension; Myocardial Infarction;  Peripheral Arterial Disease; Peripheral Venous Disease; Phlebitis; Vasculitis Gastrointestinal Medical History: Negative for: Cirrhosis ; Colitis; Crohnos; Hepatitis A; Hepatitis B; Hepatitis C Endocrine Medical History:  Negative for: Type I Diabetes; Type II Diabetes Genitourinary Medical History: Negative for: End Stage Renal Disease Immunological Medical History: Negative for: Lupus Erythematosus; Raynaudos; Scleroderma Integumentary (Skin) Medical History: Negative for: History of Burn; History of pressure wounds Musculoskeletal Medical History: Positive for: Osteoarthritis VESTER, TITSWORTH ([id number]) Negative for: Gout; Rheumatoid Arthritis; Osteomyelitis Oncologic Medical History: Positive for: Received Chemotherapy; Received Radiation Past Medical History Notes: breast cancer Immunizations Pneumococcal Vaccine: Received Pneumococcal Vaccination: No Implantable Devices None Family and Social History 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 Date of Service: 07/26/2022 Medical Record Number: [id number] Patient Account Number: [id number] Date of Birth/Sex: 27-Jul-1946 (76 y.o. F) Treating RN: Cornell Barman Primary Care Provider: Hilda Lias, [place] Other Clinician: [name] Referring Provider: [facility] Treating Provider/Extender: Yaakov Guthrie in Treatment: 55 Diagnosis Coding ICD-10 Codes Code Description [id number] 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 I87.2 Venous insufficiency  (chronic) (peripheral) Z79.01 Long term (current) use of anticoagulants I10 Essential (primary) hypertension 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 Facility Procedures CPT4 Code: 56812751 Description: 70017 - DEB SUBQ TISSUE 20 SQ CM/< Modifier: Quantity: 1 CPT4 Code: Description: ICD-10 Diagnosis Description L97.822 Non-pressure chronic ulcer of other part of left lower leg with fat layer Modifier: exposed Quantity: CPT4 Code: [id number] Description: 91638 - DEB SUBQ TISS EA ADDL 20CM Modifier: Quantity: 2 CPT4 Code: Description: ICD-10 Diagnosis Description L97.822 Non-pressure chronic ulcer of other part of left lower leg with fat layer Modifier: exposed Quantity: Physician Procedures CPT4 Code: 4665993 Description: 11042 - WC PHYS SUBQ TISS 20 SQ CM Modifier: Quantity: 1 CPT4 Code: Description: ICD-10 Diagnosis Description L97.822 Non-pressure chronic ulcer of other part of left lower leg with fat layer Modifier: exposed Quantity: CPT4 Code: [id number] Description: 11045 - WC PHYS SUBQ TISS EA ADDL 20 CM Modifier: Quantity: 2 CPT4 Code: Description: ICD-10 Diagnosis Description L97.822 Non-pressure chronic ulcer of other part of left lower leg with fat layer Modifier: exposed Quantity: Electronic Signature(s) Signed: 07/26/2022 1:41:56 PM By: Kalman Shan DO Entered By: Kalman Shan on 07/26/2022 12:28:11

## 2022-07-29 ENCOUNTER — Encounter: Payer: Self-pay | Admitting: Oncology

## 2022-08-02 ENCOUNTER — Encounter (HOSPITAL_BASED_OUTPATIENT_CLINIC_OR_DEPARTMENT_OTHER): Payer: Medicare Other | Admitting: Internal Medicine

## 2022-08-02 ENCOUNTER — Other Ambulatory Visit: Payer: Self-pay | Admitting: Oncology

## 2022-08-02 DIAGNOSIS — I87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: Secondary | ICD-10-CM | POA: Diagnosis not present

## 2022-08-02 DIAGNOSIS — L97822 Non-pressure chronic ulcer of other part of left lower leg with fat layer exposed: Secondary | ICD-10-CM | POA: Diagnosis not present

## 2022-08-02 MED ORDER — OXYCODONE HCL 10 MG PO TABS: 15.0000 mg | ORAL_TABLET | Freq: Four times a day (QID) | ORAL | 0 refills | Status: DC | PRN

## 2022-08-03 NOTE — Progress Notes (Signed)
MERI, PELOT ([id number]) Visit Report for 08/02/2022 Arrival Information Details Patient Name: Laura Santiago, Laura Santiago. Date of Service: 08/02/2022 10:30 AM Medical Record Number: [id number] Patient Account Number: [id number] Date of Birth/Sex: 09-27-1946 (76 y.o. F) Treating RN: Carlene Coria Primary Care Avaeh Ewer: [facility] Other Clinician: [name] Referring Jameca Chumley: [facility] Treating Aamirah Salmi/Extender: Yaakov Guthrie in Treatment: 20 Visit Information History Since Last Visit All ordered tests and consults were completed: No Patient Arrived: [name] Added or deleted any medications: No Arrival Time: 10:35 Any new allergies or adverse reactions: No Transfer Assistance: None Had a fall or experienced change in No Patient Requires Transmission-Based No activities of daily living that may affect Precautions: risk of falls: Patient Has Alerts: Yes Hospitalized since last visit: No Patient Alerts: PT HAS SERVICE Pain Present Now: Yes ANIMAL ABI 07/11/21 R) 1.16 L) 1.27 Electronic Signature(s) Signed: 08/02/2022 11:54:59 AM By: [name] Entered By: [name] on 08/02/2022 10:42:53 Laura Santiago ([id number]) -------------------------------------------------------------------------------- Clinic Level of Care Assessment Details Patient Name: Laura Santiago. Date of Service: 08/02/2022 10:30 AM Medical Record Number: [id number] Patient Account Number: [id number] Date of Birth/Sex: 1946/11/25 (76 y.o. F) Treating RN: Carlene Coria Primary Care Joenathan Sakuma: [facility] Other Clinician: [name] Referring Zarin Hagmann: [facility] Treating Nyema Hachey/Extender: Yaakov Guthrie in Treatment: 44 Clinic Level of Care Assessment Items TOOL 1 Quantity Score []  - Use when EandM and Procedure is performed on INITIAL visit 0 ASSESSMENTS - Nursing Assessment / Reassessment []  - General Physical Exam (combine w/  comprehensive assessment (listed just below) when performed on new 0 pt. evals) []  - 0 Comprehensive Assessment (HX, ROS, Risk Assessments, Wounds Hx, etc.) ASSESSMENTS - Wound and Skin Assessment / Reassessment []  - Dermatologic / Skin Assessment (not related to wound area) 0 ASSESSMENTS - Ostomy and/or Continence Assessment and Care []  - Incontinence Assessment and Management 0 []  - 0 Ostomy Care Assessment and Management (repouching, etc.) PROCESS - Coordination of Care []  - Simple Patient / Family Education for ongoing care 0 []  - 0 Complex (extensive) Patient / Family Education for ongoing care []  - 0 Staff obtains Programmer, systems, Records, Test Results / Process Orders []  - 0 Staff telephones HHA, Nursing Homes / Clarify orders / etc []  - 0 Routine Transfer to another Facility (non-emergent condition) []  - 0 Routine Hospital Admission (non-emergent condition) []  - 0 New Admissions / Biomedical engineer / Ordering NPWT, Apligraf, etc. []  - 0 Emergency Hospital Admission (emergent condition) PROCESS - Special Needs []  - Pediatric / Minor Patient Management 0 []  - 0 Isolation Patient Management []  - 0 Hearing / Language / Visual special needs []  - 0 Assessment of Community assistance (transportation, D/C planning, etc.) []  - 0 Additional assistance / Altered mentation []  - 0 Support Surface(s) Assessment (bed, cushion, seat, etc.) INTERVENTIONS - Miscellaneous []  - External ear exam 0 []  - 0 Patient Transfer (multiple staff / Civil Service fast streamer / Similar devices) []  - 0 Simple Staple / Suture removal (25 or less) []  - 0 Complex Staple / Suture removal (26 or more) []  - 0 Hypo/Hyperglycemic Management (do not check if billed separately) []  - 0 Ankle / Brachial Index (ABI) - do not check if billed separately Has the patient been seen at the hospital within the last three years: Yes Total Score: 0 Level Of Care: ____ Laura Santiago ([id number]) Electronic  Signature(s) Signed: 08/02/2022 11:54:59 AM By: [name] Entered By: [name] on 08/02/2022 11:20:04 Laura Santiago ([id number]) --------------------------------------------------------------------------------  Compression Therapy Details Patient Name: Laura Santiago, Laura Santiago. Date of Service: 08/02/2022 10:30 AM Medical Record Number: [id number] Patient Account Number: [id number] Date of Birth/Sex: Nov 07, 1946 (76 y.o. F) Treating RN: Carlene Coria Primary Care Katoya Amato: [facility], [place] Other Clinician: [name] Referring Tavonna Worthington: [facility] Treating Sylva Overley/Extender: Yaakov Guthrie in Treatment: 56 Compression Therapy Performed for Wound Assessment: Wound #1 Left,Medial Lower Leg Performed By: Clinician Carlene Coria, RN Compression Type: Three Layer Pre Treatment ABI: 1.5 Post Procedure Diagnosis Same as Pre-procedure Electronic Signature(s) Signed: 08/02/2022 11:54:59 AM By: [name] Entered By: [name] on 08/02/2022 11:19:46 Laura Santiago ([id number]) -------------------------------------------------------------------------------- Encounter Discharge Information Details Patient Name: Laura Santiago. Date of Service: 08/02/2022 10:30 AM Medical Record Number: [id number] Patient Account Number: [id number] Date of Birth/Sex: 1946/11/22 (76 y.o. F) Treating RN: Carlene Coria Primary Care Alieu Finnigan: [facility] Other Clinician: [name] Referring Dilan Fullenwider: [facility] Treating Hayato Guaman/Extender: Yaakov Guthrie in Treatment: 46 Encounter Discharge Information Items Post Procedure Vitals Discharge Condition: Stable Temperature (F): 98.2 Ambulatory Status: Walker Pulse (bpm): 70 Discharge Destination: Home Respiratory Rate (breaths/min): 18 Transportation: Other Blood Pressure (mmHg): 115/66 Accompanied By: self Schedule Follow-up Appointment: Yes Clinical Summary of Care: Electronic  Signature(s) Signed: 08/02/2022 11:54:59 AM By: [name] Entered By: [name] on 08/02/2022 11:43:02 Laura Santiago ([id number]) -------------------------------------------------------------------------------- Lower Extremity Assessment Details Patient Name: Laura Santiago. Date of Service: 08/02/2022 10:30 AM Medical Record Number: [id number] Patient Account Number: [id number] Date of Birth/Sex: 10/10/1946 (76 y.o. F) Treating RN: Carlene Coria Primary Care Ward Boissonneault: [facility] Other Clinician: [name] Referring Remy Voiles: [facility] Treating Dayton Sherr/Extender: Yaakov Guthrie in Treatment: 56 Edema Assessment Assessed: [Left: Yes] [Right: No] Edema: [Left: Ye] [Right: s] Calf Left: Right: Point of Measurement: 10 cm From Medial Instep 35.5 cm Ankle Left: Right: Point of Measurement: 39 cm From Medial Instep 22 cm Vascular Assessment Pulses: Dorsalis Pedis Palpable: [Left:Yes] Posterior Tibial Palpable: [Left:Yes] Electronic Signature(s) Signed: 08/02/2022 11:54:59 AM By: [name] Signed: 08/03/2022 4:19:23 PM By: Carlene Coria RN Entered By: [name] on 08/02/2022 10:57:43 Laura Santiago ([id number]) -------------------------------------------------------------------------------- Multi Wound Chart Details Patient Name: Laura Santiago. Date of Service: 08/02/2022 10:30 AM Medical Record Number: [id number] Patient Account Number: [id number] Date of Birth/Sex: 01-24-1946 (76 y.o. F) Treating RN: Carlene Coria Primary Care Alette Kataoka: [facility] Other Clinician: [name] Referring Neeko Pharo: [facility] Treating Stepan Verrette/Extender: Yaakov Guthrie in Treatment: 1 Vital Signs Height(in): 66 Pulse(bpm): 70 Weight(lbs): 153 Blood Pressure(mmHg): 115/66 Body Mass Index(BMI): 24.7 Temperature(F): 98.2 Respiratory Rate(breaths/min): 18 Photos: [N/A:N/A] Wound Location: Left, Medial  Lower Leg N/A N/A Wounding Event: Gradually Appeared N/A N/A Primary Etiology: Venous Leg Ulcer N/A N/A Comorbid History: Hypertension, Osteoarthritis, N/A N/A Received Chemotherapy, Received Radiation Date Acquired: 04/06/2021 N/A N/A Weeks of Treatment: 56 N/A N/A Wound Status: Open N/A N/A Wound Recurrence: No N/A N/A Clustered Wound: Yes N/A N/A Measurements L x W x D (cm) 9.1x12x0.1 N/A N/A Area (cm) : 85.765 N/A N/A Volume (cm) : 8.577 N/A N/A % Reduction in Area: 5.00% N/A N/A % Reduction in Volume: 52.50% N/A N/A Classification: Full Thickness Without Exposed N/A N/A Support Structures Exudate Amount: Medium N/A N/A Exudate Type: Serosanguineous N/A N/A Exudate Color: red, brown N/A N/A Wound Margin: Flat and Intact N/A N/A Granulation Amount: Medium (34-66%) N/A N/A Granulation Quality: Red, Pink N/A N/A Necrotic Amount: Medium (34-66%) N/A N/A Exposed Structures: Fat Layer (Subcutaneous Tissue): N/A N/A Yes Fascia: No Tendon:  No Muscle: No Joint: No Bone: No Epithelialization: Small (1-33%) N/A N/A Treatment Notes Electronic Signature(s) Signed: 08/02/2022 11:54:59 AM By: Betha Loa Entered By: Betha Loa on 08/02/2022 10:58:02 Laura Santiago, Laura Santiago ([id number]) Laura Santiago, Laura Santiago ([id number]) -------------------------------------------------------------------------------- Multi-Disciplinary Care Plan Details Patient Name: Laura Santiago, Laura Santiago. Date of Service: 08/02/2022 10:30 AM Medical Record Number: [id number] Patient Account Number: [id number] Date of Birth/Sex: 02/01/1946 (76 y.o. F) Treating RN: Yevonne Pax Primary Care Leani Myron: [facility] Other Clinician: Betha Loa Referring Kaena Santori: [facility] Treating Dougles Kimmey/Extender: Tilda Franco in Treatment: 34 Active Inactive Soft Tissue Infection Nursing Diagnoses: Impaired tissue integrity Potential for infection: soft tissue Goals: Patient's soft tissue infection  will resolve Date Initiated: 03/15/2022 Target Resolution Date: 04/27/2022 Goal Status: Active Signs and symptoms of infection will be recognized early to allow for prompt treatment Date Initiated: 03/15/2022 Date Inactivated: 04/26/2022 Target Resolution Date: 04/27/2022 Goal Status: Met Interventions: Assess signs and symptoms of infection every visit Treatment Activities: Culture and sensitivity : 03/15/2022 Notes: Wound/Skin Impairment Nursing Diagnoses: Impaired tissue integrity Goals: Patient/caregiver will verbalize understanding of skin care regimen Date Initiated: 07/06/2021 Date Inactivated: 07/27/2021 Target Resolution Date: 07/06/2021 Goal Status: Met Ulcer/skin breakdown will have a volume reduction of 30% by week 4 Date Initiated: 07/06/2021 Date Inactivated: 10/12/2021 Target Resolution Date: 08/06/2021 Goal Status: Unmet Unmet Reason: cont tx Ulcer/skin breakdown will have a volume reduction of 50% by week 8 Date Initiated: 07/06/2021 Target Resolution Date: 09/06/2021 Goal Status: Active Ulcer/skin breakdown will have a volume reduction of 80% by week 12 Date Initiated: 07/06/2021 Target Resolution Date: 10/06/2021 Goal Status: Active Ulcer/skin breakdown will heal within 14 weeks Date Initiated: 07/06/2021 Target Resolution Date: 11/06/2021 Goal Status: Active Interventions: Assess patient/caregiver ability to obtain necessary supplies Assess patient/caregiver ability to perform ulcer/skin care regimen upon admission and as needed Assess ulceration(s) every visit Treatment Activities: Referred to DME Denys Labree for dressing supplies : 07/06/2021 Skin care regimen initiated : 07/06/2021 Notes: BRIEONNA, CRUTCHER ([id number]) Electronic Signature(s) Signed: 08/02/2022 11:54:59 AM By: Betha Loa Signed: 08/03/2022 4:19:23 PM By: Yevonne Pax RN Entered By: Betha Loa on 08/02/2022 10:57:49 Laura Santiago  ([id number]) -------------------------------------------------------------------------------- Pain Assessment Details Patient Name: Laura Santiago. Date of Service: 08/02/2022 10:30 AM Medical Record Number: [id number] Patient Account Number: [id number] Date of Birth/Sex: 1946-11-05 (76 y.o. F) Treating RN: Yevonne Pax Primary Care Garrie Elenes: [facility] Other Clinician: Betha Loa Referring Nakesha Ebrahim: [facility] Treating Zayon Trulson/Extender: Tilda Franco in Treatment: 38 Active Problems Location of Pain Severity and Description of Pain Patient Has Paino Yes Site Locations Pain Location: Generalized Pain, Pain in Ulcers Duration of the Pain. Constant / Intermittento Constant Character of Pain Describe the Pain: Burning, Cramping Pain Management and Medication Current Pain Management: Medication: Yes Rest: Yes Electronic Signature(s) Signed: 08/02/2022 11:54:59 AM By: Betha Loa Signed: 08/03/2022 4:19:23 PM By: Yevonne Pax RN Entered By: Betha Loa on 08/02/2022 10:46:56 Laura Santiago ([id number]) -------------------------------------------------------------------------------- Patient/Caregiver Education Details Patient Name: Laura Santiago, Laura Santiago. Date of Service: 08/02/2022 10:30 AM Medical Record Number: [id number] Patient Account Number: [id number] Date of Birth/Gender: 03-Dec-1946 (76 y.o. F) Treating RN: Yevonne Pax Primary Care Physician: [facility] Other Clinician: Betha Loa Referring Physician: [facility] Treating Physician/Extender: Tilda Franco in Treatment: 58 Education Assessment Education Provided To: Patient Education Topics Provided Wound/Skin Impairment: Handouts: Other: continue wound care as directed Methods: Explain/Verbal Responses: State content correctly Electronic Signature(s) Signed: 08/02/2022 11:54:59 AM By: Betha Loa Entered By: Betha Loa on 08/02/2022  11:42:13 Laura Santiago, Laura Santiago ([id number]) -------------------------------------------------------------------------------- Wound Assessment Details Patient Name: Laura Santiago, Laura Santiago. Date of Service: 08/02/2022 10:30 AM Medical Record Number: [id number] Patient Account Number: [id number] Date of Birth/Sex: November 16, 1946 (77 y.o. F) Treating RN: Carlene Coria Primary Care Fairley Copher: [facility] Other Clinician: [name] Referring Katrice Goel: [facility] Treating Antonios Ostrow/Extender: Yaakov Guthrie in Treatment: 56 Wound Status Wound Number: 1 Primary Venous Leg Ulcer Etiology: Wound Location: Left, Medial Lower Leg Wound Status: Open Wounding Event: Gradually Appeared Comorbid Hypertension, Osteoarthritis, Received Chemotherapy, Date Acquired: 04/06/2021 History: Received Radiation Weeks Of Treatment: 56 Clustered Wound: Yes Photos Wound Measurements Length: (cm) 9.1 Width: (cm) 12 Depth: (cm) 0.1 Area: (cm) 85.765 Volume: (cm) 8.577 % Reduction in Area: 5% % Reduction in Volume: 52.5% Epithelialization: Small (1-33%) Wound Description Classification: Full Thickness Without Exposed Support Structures Wound Margin: Flat and Intact Exudate Amount: Medium Exudate Type: Serosanguineous Exudate Color: red, brown Foul Odor After Cleansing: No Slough/Fibrino Yes Wound Bed Granulation Amount: Medium (34-66%) Exposed Structure Granulation Quality: Red, Pink Fascia Exposed: No Necrotic Amount: Medium (34-66%) Fat Layer (Subcutaneous Tissue) Exposed: Yes Necrotic Quality: Adherent Slough Tendon Exposed: No Muscle Exposed: No Joint Exposed: No Bone Exposed: No Treatment Notes Wound #1 (Lower Leg) Wound Laterality: Left, Medial Cleanser Wound Cleanser Discharge Instruction: Wash your hands with soap and water. Remove old dressing, discard into plastic bag and place into trash. Cleanse the wound with Wound Cleanser prior to applying a clean dressing using  gauze sponges, not tissues or cotton balls. Do not scrub or use excessive force. Pat dry using gauze sponges, not tissue or cotton balls. Laura Santiago, Laura Santiago ([id number]) Peri-Wound Care Nystatin Cream USP 30 (g) Discharge Instruction: Use Nystatin Cream as directed. MIx 1:1 with TCA cream and applied to peri wound Triamcinolone Acetonide Cream, 0.1%, 15 (g) tube Discharge Instruction: Apply as directed. mix 1:1 with nystatin cream for peri wound Topical Primary Dressing Aquacel Extra Hydrofiber Dressing, 4x5 (in/in) keystone compound Discharge Instruction: apply to wound bed as directed on medication label PLEASE follow direction on pill bottle for proper mixing Secondary Dressing Zetuvit Absorbent Pad, 4x8 (in/in) Secured With Compression Wrap 3-LAYER WRAP - Profore Lite LF 3 Multilayer Compression Bandaging System Discharge Instruction: Apply 3 multi-layer wrap as prescribed. Compression Stockings Add-Ons Electronic Signature(s) Signed: 08/02/2022 11:54:59 AM By: [name] Signed: 08/03/2022 4:19:23 PM By: Carlene Coria RN Entered By: [name] on 08/02/2022 10:57:09 Laura Santiago, Laura Santiago ([id number]) -------------------------------------------------------------------------------- Vitals Details Patient Name: Laura Santiago. Date of Service: 08/02/2022 10:30 AM Medical Record Number: [id number] Patient Account Number: [id number] Date of Birth/Sex: 1946/11/06 (76 y.o. F) Treating RN: Carlene Coria Primary Care Yoon Barca: [facility] Other Clinician: [name] Referring Estefani Bateson: [facility] Treating Meldrick Buttery/Extender: Yaakov Guthrie in Treatment: 13 Vital Signs Time Taken: 10:43 Temperature (F): 98.2 Height (in): 66 Pulse (bpm): 70 Weight (lbs): 153 Respiratory Rate (breaths/min): 18 Body Mass Index (BMI): 24.7 Blood Pressure (mmHg): 115/66 Reference Range: 80 - 120 mg / dl Electronic Signature(s) Signed: 08/02/2022 11:54:59 AM By:  [name] Entered By: [name] on 08/02/2022 10:46:47

## 2022-08-03 NOTE — Progress Notes (Signed)
Laura Santiago, Laura Santiago ([id number]) Visit Report for 08/02/2022 Chief Complaint Document Details Patient Name: Laura Santiago, Laura Santiago. Date of Service: 08/02/2022 10:30 AM Medical Record Number: [id number] Patient Account Number: [id number] Date of Birth/Sex: July 04, 1946 (76 y.o. F) Treating RN: Carlene Coria Primary Care Provider: Hilda Lias, [place] Other Clinician: [name] Referring Provider: [facility] Treating Provider/Extender: Yaakov Guthrie in Treatment: 42 Information Obtained from: Patient Chief Complaint Left lower extremity wound Right toe wounds Left upper lateral thigh wounds Electronic Signature(s) Signed: 08/02/2022 11:26:10 AM By: Kalman Shan DO Entered By: Kalman Shan on 08/02/2022 11:20:57 Laura Santiago ([id number]) -------------------------------------------------------------------------------- Debridement Details Patient Name: Laura Santiago. Date of Service: 08/02/2022 10:30 AM Medical Record Number: [id number] Patient Account Number: [id number] Date of Birth/Sex: 1946-08-24 (76 y.o. F) Treating RN: Carlene Coria Primary Care Provider: [facility] Other Clinician: [name] Referring Provider: [facility] Treating Provider/Extender: Yaakov Guthrie in Treatment: 56 Debridement Performed for Wound #1 Left,Medial Lower Leg Assessment: Performed By: Physician Kalman Shan, MD Debridement Type: Debridement Severity of Tissue Pre Debridement: Fat layer exposed Level of Consciousness (Pre- Awake and Alert procedure): Pre-procedure Verification/Time Out Yes - 11:15 Taken: Start Time: 11:15 Total Area Debrided (L x W): 9.1 (cm) x 12 (cm) = 109.2 (cm) Tissue and other material Viable, Non-Viable, Slough, Subcutaneous, Slough debrided: Level: Skin/Subcutaneous Tissue Debridement Description: Excisional Instrument: Curette Bleeding: Minimum Hemostasis Achieved: Pressure End Time:  11:18 Response to Treatment: Procedure was tolerated well Level of Consciousness (Post- Awake and Alert procedure): Post Debridement Measurements of Total Wound Length: (cm) 9.1 Width: (cm) 12 Depth: (cm) 0.1 Volume: (cm) 8.577 Character of Wound/Ulcer Post Debridement: Stable Severity of Tissue Post Debridement: Fat layer exposed Post Procedure Diagnosis Same as Pre-procedure Electronic Signature(s) Signed: 08/02/2022 11:26:10 AM By: Kalman Shan DO Signed: 08/02/2022 11:54:59 AM By: [name] Signed: 08/03/2022 4:19:23 PM By: Carlene Coria RN Entered By: [name] on 08/02/2022 11:18:50 Laura Santiago ([id number]) -------------------------------------------------------------------------------- HPI Details Patient Name: Laura Santiago. Date of Service: 08/02/2022 10:30 AM Medical Record Number: [id number] Patient Account Number: [id number] Date of Birth/Sex: [birth year] (76 y.o. F) Treating RN: Carlene Coria Primary Care Provider: Hilda Lias, [place] Other Clinician: [name] Referring Provider: [facility] Treating Provider/Extender: Yaakov Guthrie in Treatment: 28 History of Present Illness HPI Description: Admission 7/27 Laura Santiago is a 76 year old female with a past medical history of ADHD, metastatic breast cancer, stage IV chronic kidney disease, history of DVT on Xarelto and chronic venous insufficiency that presents to the clinic for a chronic left lower extremity wound. She recently moved to [facility] 4 days ago. She was being followed by wound care center in [name]. She reports a 10-year history of wounds to her left lower extremity that eventually do heal with debridement and compression therapy. She states that the current wound reopened 4 months ago and she is using Vaseline and Coban. She denies signs of infection. 8/3; patient presents for 1 week follow-up. She reports no issues or complaints today. She  states she had vascular studies done in the last week. She denies signs of infection. She brought her little service dog with her today. 8/17; patient presents for follow-up. She has missed her last clinic appointment. She states she took the wrap off and attempted to rewrap her leg. She is having difficulty with transportation. She has her service dog with her today. Overall she feels well and reports improvement in wound healing. She denies signs of infection. She reports  owning an old Velcro wrap compression and has this at her living facility 9/14; patient presents for follow-up. Patient states that over the past 2 to 3 weeks she developed toe wounds to her right foot. She attributes this to tight fitting shoes. She subsequently developed cellulitis in the right leg and has been treated by doxycycline by her oncologist. She reports improvement in symptoms however continues to have some redness and swelling to this leg. To the left lower extremity patient has been having her wraps changed with home health twice weekly. She states that the [facility] is not helping control the drainage. Other than that she has no issues or complaints today. She denies signs of infection to the left lower extremity. 9/21; patient presents for follow-up. She reports seeing infectious disease for her cellulitis. She reports no further management. She has home health that changes the wraps twice weekly. She has no issues or complaints today. She denies signs of infection. 10/5; patient presents for follow-up. She has no issues or complaints today. She denies signs of infection. She states that the right great toe has not been dressed by home health. 10/12; patient presents for follow-up. She has no issues or complaints today. She reports improvement in her wound healing. She has been using silver alginate to the right great toe wound. She denies signs of infection. 10/26; patient presents for follow-up. Home health  did not have sorbact so they continued to use Hydrofera Blue under the wrap. She has been using silver alginate to the great toe wound however she did not have a dressing in place today. She currently denies signs of infection. 11/2; patient presents for follow-up. She has been using sorb act under the compression wrap. She reports using silver alginate to the toe wound again she does not have a dressing in place. She currently denies signs of infection. 11/23; patient presents for follow-up. Unfortunately she has missed her last 2 clinic appointments. She was last seen 3 weeks ago. She did her own compression wrap with Kerlix and Coban yesterday after seeing vein and vascular. She has not been dressing her right great toe wound. She currently denies signs of infection. 11/30; patient presents for 1 week follow-up. She states she changed her dressing last week prior to home health and use sorb act with Dakin's and Hydrofera Blue. Home health has changed the dressing as well and they have been using sorbact. Today she reports increased redness to her right lower extremity. She has a history of cellulitis to this leg. She has been using silver alginate to the right great toe. Unfortunately she had an episode of diarrhea prior to coming in and had feces all over the right leg and to the wrap of her left leg. 12/7; patient presents for 1 week follow-up. She states that home health did not come out to change the dressing and she took it off yesterday. It is unclear if she is dressing the right toe wound. She denies signs of infection. 12/14; patient presents for 1 week follow-up. She has no issues or complaints today. 12/21; patient presents for follow-up. She has no issues or complaints today. She denies signs of infection. 12/28/2021; patient presents for follow-up. She was hospitalized for sepsis secondary to right lower extremity cellulitis On 12/23. She states she is currently at a SNF. She states  that she was started on doxycycline this morning for her right great toe swelling and redness. She is not sure what dressings have been done to  her left lower extremity for the past 3 weeks. She says its been mainly gauze with an Ace wrap. 1/25; patient presents for follow-up. She is still residing in a skilled nursing facility. She reports mild pain to the left lower extremity wound bed. She states she is going to see a podiatrist soon. 2/8; patient presents for follow-up. She has moved back to her residential community from her skilled nursing facility. She has no issues or complaints today. She denies signs of systemic infections. 2/15; patient presents for follow-up. He has no issues or complaints today. She denies systemic signs of infection. 2/22; patient presents for follow-up. She has no issues or complaints today. She denies signs of infection. Laura Santiago, Laura Santiago ([id number]) 3/1; patient presents for follow-up. She states that home health came out the day after she was seen in our clinic and yesterday to do the wrap change. She denies signs of infection. She reports excoriated skin on the ankle. 3/8; patient presents for follow-up. She has no issues or complaints today. She denies signs of infection. 3/15; patient presents for follow-up. Home health has been coming out to change the dressings. She reports more tenderness to the wound site. She denies purulent drainage, increased warmth or erythema to the area. 4/5; patient presents for follow-up. She has missed her last 2 clinic appointments. I have not seen her in 3 weeks. She was recently hospitalized for altered mental status. She was involuntarily committed. She was evaluated by psychiatry and deemed to have competency. There was no specific cause of her altered mental status. It was concluded that her physical and mental health were declining due to her chronic medical conditions. Currently home health has been coming out for dressing  changes. Patient has also been doing her own dressing changes. She reports more skin breakdown to the periwound and now has a new wound. She denies fever/chills. She reports continued tenderness to the wound site. 4/12; patient with significant venous insufficiency and a large wound on her left lower leg taking up about 80% of the circumference of her lower leg. Cultures of this grew MRSA and Pseudomonas. She had completed a course of ciprofloxacin now is starting doxycycline. She has been using Dakin's wet-to-dry and a Tubigrip. She has home health twice a week and we change it once. 4/19; patient presents for follow-up. She completed her course of doxycycline. She has been using Dakin's wet-to-dry dressing and Tubigrip. Home health changes the dressing twice weekly. Currently she has no issues or complaints. 4/26; patient presents for follow-up. At last clinic visit orders for home health were Iodosorb under compression therapy. Unfortunately they did not have the dressing and have been using Dakin's and gentamicin under the wrap. Patient currently denies signs of infection. She has no issues or complaints today. 5/3; patient presents for follow-up. Again Iodosorb has not been used under the compression therapy when home health comes out to change the wrap and dressing. They have been using Sorbact. It is unclear why this is happening since we send orders weekly to the agency. She denies signs of infection. Patient has not purchased the [place] antibiotics. We reached out to the company and they said they have been trying to contact her on a regular basis. We gave the patient the number to call to order the medication. 5/10; patient presents for follow-up. She has no issues or complaints today. Again home health has not been using Iodosorb. Mepilex was on the wound bed. No other dressings noted. She brought in  her Keystone antibiotics. She denies signs of infection. 5/17; patient presents for  follow-up. Home health has come out twice since she was last seen. Joint well she has been using Keystone antibiotic with Sorbact under the compression wrap. She has no issues or complaints today. She denies signs of infection. 5/24; patient presents for follow-up. We have been using Keystone antibiotics with Sorbact under compression therapy. She is tolerating the treatment well. She is reporting improvement in wound healing. She denies signs of infection. 5/31; patient presents for follow-up. We continue to do [facility] antibiotics with Sorbact under compression therapy. She continues to report improvement in wound healing. Home health comes out and changes the dressing once weekly. 05-17-2022 upon evaluation today patient appears to be doing better in regard to her wound especially compared to the last time I saw her. Fortunately I do think that she is seeing improvements. With that being said I do believe that she may be benefit from sharp debridement today to clear away some of the necrotic debris I discussed that with her as well. She is an amendable to that plan. Otherwise she is very pleased with how the [employer] is doing for her. 6/14; patient presents for follow-up. We have been using Keystone antibiotic with Sorbact and absorbent dressings under 3 layer compression. She has no issues or complaints today. She reports improvement in wound healing. She denies signs of infection. 6/21; patient presents for follow-up. We are continuing with [facility] antibiotic and Sorbact under 3 layer compression. Patient has no complaints. Continued wound healing is happening. She denies signs of infection. 6/28; patient presents for follow-up. We have been using Keystone antibiotic with Sorbact under 3 layer compression. Usually home health comes out and changes the dressing twice a week. Unfortunately they did not go out to change the dressing. It is unclear why. Patient did not call them. She currently denies  signs of infection. 7/5; patient presents for follow-up. We have been using Keystone antibiotic with calcium alginate under 3 layer compression. She reports improvement in wound healing. She denies signs of infection. Home health has come out to do dressing changes twice this past week. 7/12; patient presents for follow-up. We have been using Keystone antibiotic with calcium alginate under 3 layer compression. Patient states that home health came out once last week to change the dressing. She reports improvement in wound healing. She currently denies signs of infection. 7/19; patient presents for follow-up. We have been using Keystone antibiotic with calcium alginate under 3 layer compression. Home health came out once last week to change the dressing. She has no issues or complaints today. She denies signs of infection. 8/2; patient presents for follow-up. We have been using Keystone antibiotic with calcium alginate under 3 layer compression. Unfortunately she missed her appointment last week and home health did not come out to do dressing changes. Patient currently denies signs of infection. 8/9; patient presents for follow-up. We have been using Keystone with calcium alginate under 3 layer compression. She states that home health came out once last week. She currently denies signs of infection. Her wrap was completely wet. She states she was cleaning the top of the leg and water soaked down into the wrap. 8/16; patient presents for follow-up. We have been using Keystone with calcium alginate under 3 layer compression. She states that home health came out twice last week. She has no issues or complaints today. 8/23; patient presents for follow-up. He has been using Keystone with calcium alginate under 3  layer compression. Home health came out twice last week. She denies signs of infection. Laura Santiago, Laura Santiago ([id number]) Electronic Signature(s) Signed: 08/02/2022 11:26:10 AM By: Kalman Shan  DO Entered By: Kalman Shan on 08/02/2022 11:21:29 Laura Santiago, Laura Santiago ([id number]) -------------------------------------------------------------------------------- Physical Exam Details Patient Name: JAHARA, [name]. Date of Service: 08/02/2022 10:30 AM Medical Record Number: [id number] Patient Account Number: [id number] Date of Birth/Sex: 07/29/1946 (76 y.o. F) Treating RN: Carlene Coria Primary Care Provider: [facility] Other Clinician: [name] Referring Provider: [facility] Treating Provider/Extender: Yaakov Guthrie in Treatment: 68 Constitutional . Cardiovascular . Psychiatric . Notes Left lower extremity: Large open wound with granulation tissue and nonviable tissue. No signs of surrounding soft tissue infection. Electronic Signature(s) Signed: 08/02/2022 11:26:10 AM By: Kalman Shan DO Entered By: Kalman Shan on 08/02/2022 11:21:46 Laura Santiago ([id number]) -------------------------------------------------------------------------------- Physician Orders Details Patient Name: Laura Santiago. Date of Service: 08/02/2022 10:30 AM Medical Record Number: [id number] Patient Account Number: [id number] Date of Birth/Sex: 1946-06-08 (76 y.o. F) Treating RN: Carlene Coria Primary Care Provider: [facility] Other Clinician: [name] Referring Provider: [facility] Treating Provider/Extender: Yaakov Guthrie in Treatment: 63 Verbal / Phone Orders: No Diagnosis Coding Follow-up Appointments o Return Appointment in 1 week. o Nurse Visit as needed [place] for wound care. May utilize formulary equivalent dressing for wound treatment orders unless otherwise specified. Home Health Nurse may visit PRN to address patientos wound care needs. o **Please direct any NON-WOUND related issues/requests for orders to patient's Primary Care  Physician. **If current dressing causes regression in wound condition, may D/C ordered dressing product/s and apply Normal Saline Moist Dressing daily until next [place] or Other MD appointment. **Notify W[facility] Healing Center of regression in wound condition at [phone]. o Other Home Health Orders/Instructions: - Dressing change 3 x weekly, twice by home health and once at wound clinic weekly. PLEASE make sure frequency between dressing changes is appropriate. Bathing/ Shower/ Hygiene o May shower with wound dressing protected with water repellent cover or cast protector. o No tub bath. Anesthetic (Use 'Patient Medications' Section for Anesthetic Order Entry) o Lidocaine applied to wound bed Edema Control - Lymphedema / Segmental Compressive Device / Other o Optional: One layer of unna paste to top of compression wrap (to act as an anchor). - PLEASE when applying wrap start from toes and go up to just below knee o Elevate, Exercise Daily and Avoid Standing for Long Periods of Time. o Elevate legs to the level of the heart and pump ankles as often as possible o Elevate leg(s) parallel to the floor when sitting. o DO YOUR BEST to sleep in the bed at night. DO NOT sleep in your recliner. Long hours of sitting in a recliner leads to swelling of the legs and/or potential wounds on your backside. Additional Orders / Instructions o Follow Nutritious Diet and Increase Protein Intake Wound Treatment Wound #1 - Lower Leg Wound Laterality: Left, Medial Cleanser: Wound Cleanser (Home Health) 3 x Per Week/30 Days Discharge Instructions: Wash your hands with soap and water. Remove old dressing, discard into plastic bag and place into trash. Cleanse the wound with Wound Cleanser prior to applying a clean dressing using gauze sponges, not tissues or cotton balls. Do not scrub or use excessive force. Pat dry using gauze sponges, not tissue or cotton balls. Peri-Wound  Care: Nystatin Cream USP 30 (g) 3 x Per Week/30 Days Discharge  Instructions: Use Nystatin Cream as directed. MIx 1:1 with TCA cream and applied to peri wound Peri-Wound Care: Triamcinolone Acetonide Cream, 0.1%, 15 (g) tube 3 x Per Week/30 Days Discharge Instructions: Apply as directed. mix 1:1 with nystatin cream for peri wound Primary Dressing: Aquacel Extra Hydrofiber Dressing, 4x5 (in/in) 3 x Per Week/30 Days Primary Dressing: keystone compound 3 x Per Week/30 Days Discharge Instructions: apply to wound bed as directed on medication label PLEASE follow direction on pill bottle for proper mixing Secondary Dressing: Zetuvit Absorbent Pad, 4x8 (in/in) 3 x Per Week/30 Days Compression Wrap: 3-LAYER WRAP - Profore Lite LF 3 Multilayer Compression Bandaging System 3 x Per Week/30 Days Discharge Instructions: Apply 3 multi-layer wrap as prescribed. Laura Santiago, Laura Santiago ([id number]) Electronic Signature(s) Signed: 08/02/2022 11:26:10 AM By: Kalman Shan DO Entered By: Kalman Shan on 08/02/2022 11:24:43 Laura Santiago, Laura Santiago ([id number]) -------------------------------------------------------------------------------- Problem List Details Patient Name: AMYAH, CLAWSON. Date of Service: 08/02/2022 10:30 AM Medical Record Number: [id number] Patient Account Number: [id number] Date of Birth/Sex: 01-19-1946 (76 y.o. F) Treating RN: Carlene Coria Primary Care Provider: [facility] Other Clinician: [name] Referring Provider: 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 07/06/2021 No Yes Inactive Problems ICD-10 Code Description Active Date Inactive Date S81.802A Unspecified open wound, left lower leg, initial encounter 07/06/2021 07/06/2021 S91.101A Unspecified open wound of right great toe without damage to nail, initial 08/24/2021 08/24/2021 encounter S91.104A Unspecified open wound of right lesser toe(s) without damage to nail, initial 08/24/2021 08/24/2021 encounter Resolved Problems ICD-10 Code Description Active Date Resolved Date S91.104D Unspecified open wound of right lesser toe(s) without damage to nail, 08/31/2021 08/31/2021 subsequent encounter Laura Santiago, Laura Santiago ([id number]) S91.201D Unspecified open wound of right great toe with damage to nail, subsequent 08/31/2021 08/31/2021 encounter Electronic Signature(s) Signed: 08/02/2022 11:26:10 AM By: Kalman Shan DO Entered By: Kalman Shan on 08/02/2022 11:20:53 Laura Santiago ([id number]) -------------------------------------------------------------------------------- Progress Note Details Patient Name: Laura Santiago. Date of Service: 08/02/2022 10:30 AM Medical Record Number: [id number] Patient Account Number: [id number] Date of Birth/Sex: 09-01-1946 (76 y.o. F) Treating RN: Carlene Coria Primary Care Provider: Hilda Lias, [place] Other Clinician: [name] Referring Provider: [facility] Treating Provider/Extender: Yaakov Guthrie in Treatment: 56 Subjective Chief Complaint Information obtained from Patient Left lower extremity wound Right toe wounds Left upper lateral thigh wounds History of Present Illness (HPI) Admission 7/27 Ms. Isebella Upshur is a 76 year old female with a past medical history of ADHD, metastatic breast cancer, stage IV chronic kidney disease, history of DVT on Xarelto and chronic venous  insufficiency that presents to the clinic for a chronic left lower extremity wound. She recently moved to [facility] 4 days ago. She was being followed by wound care center in [name]. She reports a 10-year history of wounds to her left lower extremity that eventually do heal with debridement and compression therapy. She states that the current wound reopened 4 months ago and she is using Vaseline and Coban. She denies signs of infection. 8/3; patient presents for 1 week follow-up. She reports no  issues or complaints today. She states she had vascular studies done in the last week. She denies signs of infection. She brought her little service dog with her today. 8/17; patient presents for follow-up. She has missed her last clinic appointment. She states she took the wrap off and attempted to rewrap her leg. She is having difficulty with transportation. She has her service dog with her today. Overall she feels well and reports improvement in wound healing. She denies signs of infection. She reports owning an old Velcro wrap compression and has this at her living facility 9/14; patient presents for follow-up. Patient states that over the past 2 to 3 weeks she developed toe wounds to her right foot. She attributes this to tight fitting shoes. She subsequently developed cellulitis in the right leg and has been treated by doxycycline by her oncologist. She reports improvement in symptoms however continues to have some redness and swelling to this leg. To the left lower extremity patient has been having her wraps changed with home health twice weekly. She states that the [facility] is not helping control the drainage. Other than that she has no issues or complaints today. She denies signs of infection to the left lower extremity. 9/21; patient presents for follow-up. She reports seeing infectious disease for her cellulitis. She reports no further management. She has home health that changes  the wraps twice weekly. She has no issues or complaints today. She denies signs of infection. 10/5; patient presents for follow-up. She has no issues or complaints today. She denies signs of infection. She states that the right great toe has not been dressed by home health. 10/12; patient presents for follow-up. She has no issues or complaints today. She reports improvement in her wound healing. She has been using silver alginate to the right great toe wound. She denies signs of infection. 10/26; patient presents for follow-up. Home health did not have sorbact so they continued to use Hydrofera Blue under the wrap. She has been using silver alginate to the great toe wound however she did not have a dressing in place today. She currently denies signs of infection. 11/2; patient presents for follow-up. She has been using sorb act under the compression wrap. She reports using silver alginate to the toe wound again she does not have a dressing in place. She currently denies signs of infection. 11/23; patient presents for follow-up. Unfortunately she has missed her last 2 clinic appointments. She was last seen 3 weeks ago. She did her own compression wrap with Kerlix and Coban yesterday after seeing vein and vascular. She has not been dressing her right great toe wound. She currently denies signs of infection. 11/30; patient presents for 1 week follow-up. She states she changed her dressing last week prior to home health and use sorb act with Dakin's and Hydrofera Blue. Home health has changed the dressing as well and they have been using sorbact. Today she reports increased redness to her right lower extremity. She has a history of cellulitis to this leg. She has been using silver alginate to the right great toe. Unfortunately she had an episode of diarrhea prior to coming in and had feces all over the right leg and to the wrap of her left leg. 12/7; patient presents for 1 week follow-up. She states that  home health did not come out to change the dressing and she took it off yesterday. It is unclear if she is dressing the right toe wound. She denies signs of infection.  12/14; patient presents for 1 week follow-up. She has no issues or complaints today. 12/21; patient presents for follow-up. She has no issues or complaints today. She denies signs of infection. 12/28/2021; patient presents for follow-up. She was hospitalized for sepsis secondary to right lower extremity cellulitis On 12/23. She states she is currently at a SNF. She states that she was started on doxycycline this morning for her right great toe swelling and redness. She is not sure what dressings have been done to her left lower extremity for the past 3 weeks. She says its been mainly gauze with an Ace wrap. 1/25; patient presents for follow-up. She is still residing in a skilled nursing facility. She reports mild pain to the left lower extremity wound bed. She states she is going to see a podiatrist soon. 2/8; patient presents for follow-up. She has moved back to her residential community from her skilled nursing facility. She has no issues or complaints today. She denies signs of systemic infections. KURSTEN, KRUK ([id number]) 2/15; patient presents for follow-up. He has no issues or complaints today. She denies systemic signs of infection. 2/22; patient presents for follow-up. She has no issues or complaints today. She denies signs of infection. 3/1; patient presents for follow-up. She states that home health came out the day after she was seen in our clinic and yesterday to do the wrap change. She denies signs of infection. She reports excoriated skin on the ankle. 3/8; patient presents for follow-up. She has no issues or complaints today. She denies signs of infection. 3/15; patient presents for follow-up. Home health has been coming out to change the dressings. She reports more tenderness to the wound site. She denies  purulent drainage, increased warmth or erythema to the area. 4/5; patient presents for follow-up. She has missed her last 2 clinic appointments. I have not seen her in 3 weeks. She was recently hospitalized for altered mental status. She was involuntarily committed. She was evaluated by psychiatry and deemed to have competency. There was no specific cause of her altered mental status. It was concluded that her physical and mental health were declining due to her chronic medical conditions. Currently home health has been coming out for dressing changes. Patient has also been doing her own dressing changes. She reports more skin breakdown to the periwound and now has a new wound. She denies fever/chills. She reports continued tenderness to the wound site. 4/12; patient with significant venous insufficiency and a large wound on her left lower leg taking up about 80% of the circumference of her lower leg. Cultures of this grew MRSA and Pseudomonas. She had completed a course of ciprofloxacin now is starting doxycycline. She has been using Dakin's wet-to-dry and a Tubigrip. She has home health twice a week and we change it once. 4/19; patient presents for follow-up. She completed her course of doxycycline. She has been using Dakin's wet-to-dry dressing and Tubigrip. Home health changes the dressing twice weekly. Currently she has no issues or complaints. 4/26; patient presents for follow-up. At last clinic visit orders for home health were Iodosorb under compression therapy. Unfortunately they did not have the dressing and have been using Dakin's and gentamicin under the wrap. Patient currently denies signs of infection. She has no issues or complaints today. 5/3; patient presents for follow-up. Again Iodosorb has not been used under the compression therapy when home health comes out to change the wrap and dressing. They have been using Sorbact. It is unclear why this is happening  since we send orders  weekly to the agency. She denies signs of infection. Patient has not purchased the [facility] antibiotics. We reached out to the company and they said they have been trying to contact her on a regular basis. We gave the patient the number to call to order the medication. 5/10; patient presents for follow-up. She has no issues or complaints today. Again home health has not been using Iodosorb. Mepilex was on the wound bed. No other dressings noted. She brought in her Keystone antibiotics. She denies signs of infection. 5/17; patient presents for follow-up. Home health has come out twice since she was last seen. Joint well she has been using Keystone antibiotic with Sorbact under the compression wrap. She has no issues or complaints today. She denies signs of infection. 5/24; patient presents for follow-up. We have been using Keystone antibiotics with Sorbact under compression therapy. She is tolerating the treatment well. She is reporting improvement in wound healing. She denies signs of infection. 5/31; patient presents for follow-up. We continue to do [facility] antibiotics with Sorbact under compression therapy. She continues to report improvement in wound healing. Home health comes out and changes the dressing once weekly. 05-17-2022 upon evaluation today patient appears to be doing better in regard to her wound especially compared to the last time I saw her. Fortunately I do think that she is seeing improvements. With that being said I do believe that she may be benefit from sharp debridement today to clear away some of the necrotic debris I discussed that with her as well. She is an amendable to that plan. Otherwise she is very pleased with how the [employer] is doing for her. 6/14; patient presents for follow-up. We have been using Keystone antibiotic with Sorbact and absorbent dressings under 3 layer compression. She has no issues or complaints today. She reports improvement in wound healing. She  denies signs of infection. 6/21; patient presents for follow-up. We are continuing with [facility] antibiotic and Sorbact under 3 layer compression. Patient has no complaints. Continued wound healing is happening. She denies signs of infection. 6/28; patient presents for follow-up. We have been using Keystone antibiotic with Sorbact under 3 layer compression. Usually home health comes out and changes the dressing twice a week. Unfortunately they did not go out to change the dressing. It is unclear why. Patient did not call them. She currently denies signs of infection. 7/5; patient presents for follow-up. We have been using Keystone antibiotic with calcium alginate under 3 layer compression. She reports improvement in wound healing. She denies signs of infection. Home health has come out to do dressing changes twice this past week. 7/12; patient presents for follow-up. We have been using Keystone antibiotic with calcium alginate under 3 layer compression. Patient states that home health came out once last week to change the dressing. She reports improvement in wound healing. She currently denies signs of infection. 7/19; patient presents for follow-up. We have been using Keystone antibiotic with calcium alginate under 3 layer compression. Home health came out once last week to change the dressing. She has no issues or complaints today. She denies signs of infection. 8/2; patient presents for follow-up. We have been using Keystone antibiotic with calcium alginate under 3 layer compression. Unfortunately she missed her appointment last week and home health did not come out to do dressing changes. Patient currently denies signs of infection. 8/9; patient presents for follow-up. We have been using Keystone with calcium alginate under 3 layer compression. She states  that home health came out once last week. She currently denies signs of infection. Her wrap was completely wet. She states she was cleaning the  top of the leg and water soaked down into the wrap. 8/16; patient presents for follow-up. We have been using Keystone with calcium alginate under 3 layer compression. She states that home health came out twice last week. She has no issues or complaints today. Laura Santiago, Laura Santiago ([id number]) 8/23; patient presents for follow-up. He has been using Keystone with calcium alginate under 3 layer compression. Home health came out twice last week. She denies signs of infection. Objective Constitutional Vitals Time Taken: 10:43 AM, Height: 66 in, Weight: 153 lbs, BMI: 24.7, Temperature: 98.2 F, Pulse: 70 bpm, Respiratory Rate: 18 breaths/min, Blood Pressure: 115/66 mmHg. General Notes: Left lower extremity: Large open wound with granulation tissue and nonviable tissue. No signs of surrounding soft tissue infection. Integumentary (Hair, Skin) Wound #1 status is Open. Original cause of wound was Gradually Appeared. The date acquired was: 04/06/2021. The wound has been in treatment 56 weeks. The wound is located on the Left,Medial Lower Leg. The wound measures 9.1cm length x 12cm width x 0.1cm depth; 85.765cm^2 area and 8.577cm^3 volume. There is Fat Layer (Subcutaneous Tissue) exposed. There is a medium amount of serosanguineous drainage noted. The wound margin is flat and intact. There is medium (34-66%) red, pink granulation within the wound bed. There is a medium (34-66%) amount of necrotic tissue within the wound bed including Adherent Slough. 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 No signs of surrounding infection. I debrided nonviable tissue. I recommended  continuing the course with [facility] antibiotic and calcium alginate under 3 layer compression. She is still doing chemotherapy infusions however she completes her treatment course next week. Hopefully we will see an improvement in wound healing once this stops. 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 There was a Excisional Skin/Subcutaneous Tissue Debridement with a total area of 109.2 sq cm performed by Kalman Shan, MD. With the following instrument(s): Curette to remove Viable and Non-Viable tissue/material. Material removed includes Subcutaneous Tissue and Slough and. A time out was conducted at 11:15, prior to the start of the procedure. A Minimum amount of bleeding was controlled with Pressure. The procedure was tolerated well. Post Debridement Measurements: 9.1cm length x 12cm width x 0.1cm depth; 8.577cm^3 volume. Character of Wound/Ulcer Post Debridement is stable. Severity of Tissue Post Debridement is: Fat layer exposed. 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 There was a Three Layer Compression Therapy Procedure with a pre-treatment ABI of 1.5 by Carlene Coria, RN. Post procedure Diagnosis Wound #1: Same as Pre-Procedure Laura Santiago, Laura Santiago ([id number]) Plan Follow-up Appointments: Return Appointment in 1 week. Nurse Visit as needed Home Health: [place]: - [place] for wound care. May utilize formulary equivalent dressing for wound treatment orders unless otherwise specified. Home Health Nurse may visit PRN to address patient s wound care needs. **Please direct any NON-WOUND related issues/requests for orders to patient's Primary Care Physician. **  If current dressing causes regression in wound  condition, may D/C ordered dressing product/s and apply Normal Saline Moist Dressing daily until next [place] or Other MD appointment. **Notify [facility] of regression in wound condition at [phone]. Other Home Health Orders/Instructions: - Dressing change 3 x weekly, twice by home health and once at wound clinic weekly. PLEASE make sure frequency between dressing changes is appropriate. Bathing/ Shower/ Hygiene: May shower with wound dressing protected with water repellent cover or cast protector. No tub bath. Anesthetic (Use 'Patient Medications' Section for Anesthetic Order Entry): Lidocaine applied to wound bed Edema Control - Lymphedema / Segmental Compressive Device / Other: Optional: One layer of unna paste to top of compression wrap (to act as an anchor). - PLEASE when applying wrap start from toes and go up to just below knee Elevate, Exercise Daily and Avoid Standing for Long Periods of Time. Elevate legs to the level of the heart and pump ankles as often as possible Elevate leg(s) parallel to the floor when sitting. DO YOUR BEST to sleep in the bed at night. DO NOT sleep in your recliner. Long hours of sitting in a recliner leads to swelling of the legs and/or potential wounds on your backside. Additional Orders / Instructions: Follow Nutritious Diet and Increase Protein Intake WOUND #1: - Lower Leg Wound Laterality: Left, Medial Cleanser: Wound Cleanser (Home Health) 3 x Per Week/30 Days Discharge Instructions: Wash your hands with soap and water. Remove old dressing, discard into plastic bag and place into trash. Cleanse the wound with Wound Cleanser prior to applying a clean dressing using gauze sponges, not tissues or cotton balls. Do not scrub or use excessive force. Pat dry using gauze sponges, not tissue or cotton balls. Peri-Wound Care: Nystatin Cream USP 30 (g) 3 x Per Week/30 Days Discharge Instructions: Use Nystatin Cream as directed. MIx  1:1 with TCA cream and applied to peri wound Peri-Wound Care: Triamcinolone Acetonide Cream, 0.1%, 15 (g) tube 3 x Per Week/30 Days Discharge Instructions: Apply as directed. mix 1:1 with nystatin cream for peri wound Primary Dressing: Aquacel Extra Hydrofiber Dressing, 4x5 (in/in) 3 x Per Week/30 Days Primary Dressing: keystone compound 3 x Per Week/30 Days Discharge Instructions: apply to wound bed as directed on medication label PLEASE follow direction on pill bottle for proper mixing Secondary Dressing: Zetuvit Absorbent Pad, 4x8 (in/in) 3 x Per Week/30 Days Compression Wrap: 3-LAYER WRAP - Profore Lite LF 3 Multilayer Compression Bandaging System 3 x Per Week/30 Days Discharge Instructions: Apply 3 multi-layer wrap as prescribed. 1. In office sharp debridement 2. Calcium alginate with Keystone antibiotic under 3 layer compression 3. Follow-up in 1 week Electronic Signature(s) Signed: 08/02/2022 11:26:10 AM By: Kalman Shan DO Entered By: Kalman Shan on 08/02/2022 11:24:17 Laura Santiago, Laura Santiago ([id number]) -------------------------------------------------------------------------------- ROS/PFSH Details Patient Name: Laura Santiago. Date of Service: 08/02/2022 10:30 AM Medical Record Number: [id number] Patient Account Number: [id number] Date of Birth/Sex: 1946-02-21 (76 y.o. F) Treating RN: Carlene Coria Primary Care Provider: [facility] Other Clinician: [name] Referring Provider: [facility] Treating Provider/Extender: Yaakov Guthrie in Treatment: 51 Information Obtained From Patient Eyes Medical History: Negative for: Cataracts; Glaucoma; Optic Neuritis Ear/Nose/Mouth/Throat Medical History: Negative for: Chronic sinus problems/congestion; Middle ear problems Hematologic/Lymphatic Medical History: Negative for: Anemia; Hemophilia; Human Immunodeficiency Virus; Lymphedema; Sickle Cell Disease Respiratory Medical History: Negative  for: Aspiration; Asthma; Chronic Obstructive Pulmonary Disease (COPD); Pneumothorax; Sleep Apnea; Tuberculosis Cardiovascular Medical History: Positive for: Hypertension Negative for: Angina; Arrhythmia; Congestive  Heart Failure; Coronary Artery Disease; Deep Vein Thrombosis; Hypotension; Myocardial Infarction; Peripheral Arterial Disease; Peripheral Venous Disease; Phlebitis; Vasculitis Gastrointestinal Medical History: Negative for: Cirrhosis ; Colitis; Crohnos; Hepatitis A; Hepatitis B; Hepatitis C Endocrine Medical History: Negative for: Type I Diabetes; Type II Diabetes Genitourinary Medical History: Negative for: End Stage Renal Disease Immunological Medical History: Negative for: Lupus Erythematosus; Raynaudos; Scleroderma Integumentary (Skin) Medical History: Negative for: History of Burn; History of pressure wounds Musculoskeletal Medical History: Positive for: Osteoarthritis SYDNIE, SIGMUND ([id number]) Negative for: Gout; Rheumatoid Arthritis; Osteomyelitis Oncologic Medical History: Positive for: Received Chemotherapy; Received Radiation Past Medical History Notes: breast cancer Immunizations Pneumococcal Vaccine: Received Pneumococcal Vaccination: No Implantable Devices None Family and Social History Never smoker Electronic Signature(s) Signed: 08/02/2022 11:26:10 AM By: Kalman Shan DO Signed: 08/03/2022 4:19:23 PM By: Carlene Coria RN Entered By: Kalman Shan on 08/02/2022 11:25:44 SAREEN, RANDON ([id number]) -------------------------------------------------------------------------------- [place] Details Patient Name: Laura Santiago. Date of Service: 08/02/2022 Medical Record Number: [id number] Patient Account Number: [id number] Date of Birth/Sex: 06/12/1946 (76 y.o. F) Treating RN: Carlene Coria Primary Care Provider: [facility] Other Clinician: [name] Referring Provider: [facility] Treating Provider/Extender:  Yaakov Guthrie in Treatment: 56 Diagnosis Coding ICD-10 Codes Code Description [phone] 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 I87.2 Venous insufficiency (chronic) (peripheral) Z79.01 Long term (current) use of anticoagulants I10 Essential (primary) hypertension 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 Facility Procedures CPT4 Code: 63817711 Description: 65790 - DEB SUBQ TISSUE 20 SQ CM/< Modifier: Quantity: 1 CPT4 Code: Description: ICD-10 Diagnosis Description L97.822 Non-pressure chronic ulcer of other part of left lower leg with fat layer Modifier: exposed Quantity: CPT4 Code: [id number] Description: 91916 - DEB SUBQ TISS EA ADDL 20CM Modifier: Quantity: 5 CPT4 Code: Description: ICD-10 Diagnosis Description L97.822 Non-pressure chronic ulcer of other part of left lower leg with fat layer Modifier: exposed Quantity: Physician Procedures CPT4 Code: 6060045 Description: 11042 - WC PHYS SUBQ TISS 20 SQ CM Modifier: Quantity: 1 CPT4 Code: Description: ICD-10 Diagnosis Description L97.822 Non-pressure chronic ulcer of other part of left lower leg with fat layer Modifier: exposed Quantity: CPT4 Code: [id number] Description: 11045 - WC PHYS SUBQ TISS EA ADDL 20 CM Modifier: Quantity: 5 CPT4 Code: Description: ICD-10 Diagnosis Description L97.822 Non-pressure chronic ulcer of other part of left lower leg with fat layer Modifier: exposed Quantity: Electronic Signature(s) Signed: 08/02/2022 11:26:10 AM By: Kalman Shan DO Entered By: Kalman Shan on 08/02/2022 11:24:32

## 2022-08-08 ENCOUNTER — Encounter: Payer: Self-pay | Admitting: Oncology

## 2022-08-08 ENCOUNTER — Other Ambulatory Visit: Payer: Self-pay | Admitting: Nurse Practitioner

## 2022-08-08 ENCOUNTER — Other Ambulatory Visit: Payer: Self-pay

## 2022-08-08 DIAGNOSIS — C50919 Malignant neoplasm of unspecified site of unspecified female breast: Secondary | ICD-10-CM

## 2022-08-09 ENCOUNTER — Encounter (HOSPITAL_BASED_OUTPATIENT_CLINIC_OR_DEPARTMENT_OTHER): Payer: Medicare Other | Admitting: Internal Medicine

## 2022-08-09 DIAGNOSIS — L97822 Non-pressure chronic ulcer of other part of left lower leg with fat layer exposed: Secondary | ICD-10-CM | POA: Diagnosis not present

## 2022-08-09 DIAGNOSIS — I87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: Secondary | ICD-10-CM | POA: Diagnosis not present

## 2022-08-11 ENCOUNTER — Encounter: Payer: Self-pay | Admitting: *Deleted

## 2022-08-11 ENCOUNTER — Telehealth: Payer: Self-pay | Admitting: Student

## 2022-08-11 NOTE — Telephone Encounter (Signed)
Palliative NP received call from patient's daughter [name]. Patient endorses worsening neck pain. She denies any recent fall or injury. Patient did change out duragesic patch earlier today. Reviewed Medications. She is instructed to take PRN oxycodone for breakthrough pain as well as gabapentin. Patient also instructed to try moist heat. If no improvement, patient encouraged to be seen in ED; daughter verbalizes understanding. Will see patient for f/u visit on Tuesday.

## 2022-08-11 NOTE — Progress Notes (Signed)
Laura, Santiago ([id number]) Visit Report for 08/09/2022 Chief Complaint Document Details Patient Name: Laura, Santiago. Date of Service: 08/09/2022 10:30 AM Medical Record Number: [id number] Patient Account Number: [id number] Date of Birth/Sex: Jun 22, 1946 (76 y.o. Female) Treating RN: Cornell Barman Primary Care Provider: [facility], [place] Other Clinician: [name] Referring Provider: [facility] Treating Provider/Extender: Yaakov Guthrie in Treatment: 68 Information Obtained from: Patient Chief Complaint Left lower extremity wound Right toe wounds Left upper lateral thigh wounds Electronic Signature(s) Signed: 08/09/2022 1:04:39 PM By: Kalman Shan DO Entered By: Kalman Shan on 08/09/2022 12:17:20 Laura Santiago ([id number]) -------------------------------------------------------------------------------- Debridement Details Patient Name: Laura Santiago. Date of Service: 08/09/2022 10:30 AM Medical Record Number: [id number] Patient Account Number: [id number] Date of Birth/Sex: 10/21/1946 (76 y.o. Female) Treating RN: Cornell Barman Primary Care Provider: [facility] L[facility], [place] Other Clinician: [name] Referring Provider: [facility] Treating Provider/Extender: Yaakov Guthrie in Treatment: 57 Debridement Performed for Wound #1 Left,Medial Lower Leg Assessment: Performed By: Physician Kalman Shan, MD Debridement Type: Debridement Severity of Tissue Pre Debridement: Fat layer exposed Level of Consciousness (Pre- Awake and Alert procedure): Pre-procedure Verification/Time Out Yes - 11:27 Taken: Start Time: 11:27 Total Area Debrided (L x W): 9.4 (cm) x 10.9 (cm) = 102.46 (cm) Tissue and other material Viable, Non-Viable, Slough, Subcutaneous, Slough debrided: Level: Skin/Subcutaneous Tissue Debridement Description: Excisional Instrument: Curette Bleeding: Minimum Hemostasis Achieved: Pressure End Time:  11:34 Response to Treatment: Procedure was tolerated well Level of Consciousness (Post- Awake and Alert procedure): Post Debridement Measurements of Total Wound Length: (cm) 9.4 Width: (cm) 10.9 Depth: (cm) 0.3 Volume: (cm) 24.142 Character of Wound/Ulcer Post Debridement: Stable Severity of Tissue Post Debridement: Fat layer exposed Post Procedure Diagnosis Same as Pre-procedure Electronic Signature(s) Signed: 08/09/2022 1:04:39 PM By: Kalman Shan DO Signed: 08/10/2022 4:44:47 PM By: [name] Signed: 08/10/2022 6:20:35 PM By: Gretta Cool, BSN, RN, CWS, [name] RN, BSN Entered By: [name] on 08/09/2022 11:34:16 Laura, Santiago ([id number]) -------------------------------------------------------------------------------- HPI Details Patient Name: [name], [name] Date of Service: 08/09/2022 10:30 AM Medical Record Number: [id number] Patient Account Number: [id number] Date of Birth/Sex: 01-24-1946 (76 y.o. Female) Treating RN: Cornell Barman Primary Care Provider: [facility], [place] Other Clinician: [name] Referring Provider: [facility] Treating Provider/Extender: Yaakov Guthrie in Treatment: 81 History of Present Illness HPI Description: Admission 7/27 Ms. Laura Santiago is a 76 year old female with a past medical history of ADHD, metastatic breast cancer, stage IV chronic kidney disease, history of DVT on Xarelto and chronic venous insufficiency that presents to the clinic for a chronic left lower extremity wound. She recently moved to [facility] 4 days ago. She was being followed by wound care center in [name]. She reports a 10-year history of wounds to her left lower extremity that eventually do heal with debridement and compression therapy. She states that the current wound reopened 4 months ago and she is using Vaseline and Coban. She denies signs of infection. 8/3; patient presents for 1 week follow-up. She reports no issues or  complaints today. She states she had vascular studies done in the last week. She denies signs of infection. She brought her little service dog with her today. 8/17; patient presents for follow-up. She has missed her last clinic appointment. She states she took the wrap off and attempted to rewrap her leg. She is having difficulty with transportation. She has her service dog with her today. Overall she feels well and reports improvement in wound healing. She denies signs  of infection. She reports owning an old Velcro wrap compression and has this at her living facility 9/14; patient presents for follow-up. Patient states that over the past 2 to 3 weeks she developed toe wounds to her right foot. She attributes this to tight fitting shoes. She subsequently developed cellulitis in the right leg and has been treated by doxycycline by her oncologist. She reports improvement in symptoms however continues to have some redness and swelling to this leg. To the left lower extremity patient has been having her wraps changed with home health twice weekly. She states that the [facility] is not helping control the drainage. Other than that she has no issues or complaints today. She denies signs of infection to the left lower extremity. 9/21; patient presents for follow-up. She reports seeing infectious disease for her cellulitis. She reports no further management. She has home health that changes the wraps twice weekly. She has no issues or complaints today. She denies signs of infection. 10/5; patient presents for follow-up. She has no issues or complaints today. She denies signs of infection. She states that the right great toe has not been dressed by home health. 10/12; patient presents for follow-up. She has no issues or complaints today. She reports improvement in her wound healing. She has been using silver alginate to the right great toe wound. She denies signs of infection. 10/26; patient presents for  follow-up. Home health did not have sorbact so they continued to use Hydrofera Blue under the wrap. She has been using silver alginate to the great toe wound however she did not have a dressing in place today. She currently denies signs of infection. 11/2; patient presents for follow-up. She has been using sorb act under the compression wrap. She reports using silver alginate to the toe wound again she does not have a dressing in place. She currently denies signs of infection. 11/23; patient presents for follow-up. Unfortunately she has missed her last 2 clinic appointments. She was last seen 3 weeks ago. She did her own compression wrap with Kerlix and Coban yesterday after seeing vein and vascular. She has not been dressing her right great toe wound. She currently denies signs of infection. 11/30; patient presents for 1 week follow-up. She states she changed her dressing last week prior to home health and use sorb act with Dakin's and Hydrofera Blue. Home health has changed the dressing as well and they have been using sorbact. Today she reports increased redness to her right lower extremity. She has a history of cellulitis to this leg. She has been using silver alginate to the right great toe. Unfortunately she had an episode of diarrhea prior to coming in and had feces all over the right leg and to the wrap of her left leg. 12/7; patient presents for 1 week follow-up. She states that home health did not come out to change the dressing and she took it off yesterday. It is unclear if she is dressing the right toe wound. She denies signs of infection. 12/14; patient presents for 1 week follow-up. She has no issues or complaints today. 12/21; patient presents for follow-up. She has no issues or complaints today. She denies signs of infection. 12/28/2021; patient presents for follow-up. She was hospitalized for sepsis secondary to right lower extremity cellulitis On 12/23. She states she is currently  at a SNF. She states that she was started on doxycycline this morning for her right great toe swelling and redness. She is not sure what dressings  have been done to her left lower extremity for the past 3 weeks. She says its been mainly gauze with an Ace wrap. 1/25; patient presents for follow-up. She is still residing in a skilled nursing facility. She reports mild pain to the left lower extremity wound bed. She states she is going to see a podiatrist soon. 2/8; patient presents for follow-up. She has moved back to her residential community from her skilled nursing facility. She has no issues or complaints today. She denies signs of systemic infections. 2/15; patient presents for follow-up. He has no issues or complaints today. She denies systemic signs of infection. 2/22; patient presents for follow-up. She has no issues or complaints today. She denies signs of infection. Laura, Santiago ([id number]) 3/1; patient presents for follow-up. She states that home health came out the day after she was seen in our clinic and yesterday to do the wrap change. She denies signs of infection. She reports excoriated skin on the ankle. 3/8; patient presents for follow-up. She has no issues or complaints today. She denies signs of infection. 3/15; patient presents for follow-up. Home health has been coming out to change the dressings. She reports more tenderness to the wound site. She denies purulent drainage, increased warmth or erythema to the area. 4/5; patient presents for follow-up. She has missed her last 2 clinic appointments. I have not seen her in 3 weeks. She was recently hospitalized for altered mental status. She was involuntarily committed. She was evaluated by psychiatry and deemed to have competency. There was no specific cause of her altered mental status. It was concluded that her physical and mental health were declining due to her chronic medical conditions. Currently home health has been  coming out for dressing changes. Patient has also been doing her own dressing changes. She reports more skin breakdown to the periwound and now has a new wound. She denies fever/chills. She reports continued tenderness to the wound site. 4/12; patient with significant venous insufficiency and a large wound on her left lower leg taking up about 80% of the circumference of her lower leg. Cultures of this grew MRSA and Pseudomonas. She had completed a course of ciprofloxacin now is starting doxycycline. She has been using Dakin's wet-to-dry and a Tubigrip. She has home health twice a week and we change it once. 4/19; patient presents for follow-up. She completed her course of doxycycline. She has been using Dakin's wet-to-dry dressing and Tubigrip. Home health changes the dressing twice weekly. Currently she has no issues or complaints. 4/26; patient presents for follow-up. At last clinic visit orders for home health were Iodosorb under compression therapy. Unfortunately they did not have the dressing and have been using Dakin's and gentamicin under the wrap. Patient currently denies signs of infection. She has no issues or complaints today. 5/3; patient presents for follow-up. Again Iodosorb has not been used under the compression therapy when home health comes out to change the wrap and dressing. They have been using Sorbact. It is unclear why this is happening since we send orders weekly to the agency. She denies signs of infection. Patient has not purchased the [place] antibiotics. We reached out to the company and they said they have been trying to contact her on a regular basis. We gave the patient the number to call to order the medication. 5/10; patient presents for follow-up. She has no issues or complaints today. Again home health has not been using Iodosorb. Mepilex was on the wound bed. No other dressings  noted. She brought in her [facility] antibiotics. She denies signs of infection. 5/17;  patient presents for follow-up. Home health has come out twice since she was last seen. Joint well she has been using Keystone antibiotic with Sorbact under the compression wrap. She has no issues or complaints today. She denies signs of infection. 5/24; patient presents for follow-up. We have been using [facility] antibiotics with Sorbact under compression therapy. She is tolerating the treatment well. She is reporting improvement in wound healing. She denies signs of infection. 5/31; patient presents for follow-up. We continue to do [facility] antibiotics with Sorbact under compression therapy. She continues to report improvement in wound healing. Home health comes out and changes the dressing once weekly. 05-17-2022 upon evaluation today patient appears to be doing better in regard to her wound especially compared to the last time I saw her. Fortunately I do think that she is seeing improvements. With that being said I do believe that she may be benefit from sharp debridement today to clear away some of the necrotic debris I discussed that with her as well. She is an amendable to that plan. Otherwise she is very pleased with how the [employer] is doing for her. 6/14; patient presents for follow-up. We have been using Keystone antibiotic with Sorbact and absorbent dressings under 3 layer compression. She has no issues or complaints today. She reports improvement in wound healing. She denies signs of infection. 6/21; patient presents for follow-up. We are continuing with [facility] antibiotic and Sorbact under 3 layer compression. Patient has no complaints. Continued wound healing is happening. She denies signs of infection. 6/28; patient presents for follow-up. We have been using Keystone antibiotic with Sorbact under 3 layer compression. Usually home health comes out and changes the dressing twice a week. Unfortunately they did not go out to change the dressing. It is unclear why. Patient did not call  them. She currently denies signs of infection. 7/5; patient presents for follow-up. We have been using Keystone antibiotic with calcium alginate under 3 layer compression. She reports improvement in wound healing. She denies signs of infection. Home health has come out to do dressing changes twice this past week. 7/12; patient presents for follow-up. We have been using Keystone antibiotic with calcium alginate under 3 layer compression. Patient states that home health came out once last week to change the dressing. She reports improvement in wound healing. She currently denies signs of infection. 7/19; patient presents for follow-up. We have been using Keystone antibiotic with calcium alginate under 3 layer compression. Home health came out once last week to change the dressing. She has no issues or complaints today. She denies signs of infection. 8/2; patient presents for follow-up. We have been using Keystone antibiotic with calcium alginate under 3 layer compression. Unfortunately she missed her appointment last week and home health did not come out to do dressing changes. Patient currently denies signs of infection. 8/9; patient presents for follow-up. We have been using Keystone with calcium alginate under 3 layer compression. She states that home health came out once last week. She currently denies signs of infection. Her wrap was completely wet. She states she was cleaning the top of the leg and water soaked down into the wrap. 8/16; patient presents for follow-up. We have been using Keystone with calcium alginate under 3 layer compression. She states that home health came out twice last week. She has no issues or complaints today. 8/23; patient presents for follow-up. He has been using [place] with  calcium alginate under 3 layer compression. Home health came out twice last week. She denies signs of infection. 8/30; patient presents for follow-up. We have been using Keystone with calcium  alginate under 3 layer compression. Home health came out once Laura, Santiago. ([id number]) last week to change the dressing. Patient reports improvement in wound healing. She states she is almost done with her chemotherapy infusions and has 1 more left. Electronic Signature(s) Signed: 08/09/2022 1:04:39 PM By: Kalman Shan DO Entered By: Kalman Shan on 08/09/2022 12:17:48 Laura Santiago ([id number]) -------------------------------------------------------------------------------- Physical Exam Details Patient Name: Laura, Santiago. Date of Service: 08/09/2022 10:30 AM Medical Record Number: [id number] Patient Account Number: [id number] Date of Birth/Sex: September 20, 1946 (76 y.o. Female) Treating RN: Cornell Barman Primary Care Provider: [facility], [place] Other Clinician: [name] Referring Provider: [facility] Treating Provider/Extender: Yaakov Guthrie in Treatment: 65 Constitutional . Cardiovascular . Psychiatric . Notes Left lower extremity: Large open wound with granulation tissue and nonviable tissue. No signs of surrounding soft tissue infection. Electronic Signature(s) Signed: 08/09/2022 1:04:39 PM By: Kalman Shan DO Entered By: Kalman Shan on 08/09/2022 12:18:02 Laura Santiago ([id number]) -------------------------------------------------------------------------------- Physician Orders Details Patient Name: Laura Santiago. Date of Service: 08/09/2022 10:30 AM Medical Record Number: [id number] Patient Account Number: [id number] Date of Birth/Sex: October 05, 1946 (76 y.o. Female) Treating RN: Cornell Barman Primary Care Provider: [facility], [place] Other Clinician: [name] Referring Provider: [facility] Treating Provider/Extender: Yaakov Guthrie in Treatment: 68 Verbal / Phone Orders: No Diagnosis Coding Follow-up Appointments o Return Appointment in 1 week. o Nurse Visit as needed [place] for wound care. May utilize formulary equivalent dressing for wound treatment orders unless otherwise specified. Home Health Nurse may visit PRN to address patientos wound care needs. o **Please direct any NON-WOUND related issues/requests for orders to patient's Primary Care Physician. **If current dressing causes regression in wound condition, may D/C ordered dressing product/s and apply Normal Saline Moist Dressing daily until next [name] or Other MD appointment. **Notify Wound Healing Center of regression in wound condition at [phone]. o Other Home Health Orders/Instructions: - Dressing change 3 x weekly, twice by home health and once at wound clinic weekly. PLEASE make sure frequency between dressing changes is appropriate. Bathing/ Shower/ Hygiene o May shower with wound dressing protected with water repellent cover or cast protector. o No tub bath. Anesthetic (Use 'Patient Medications' Section for Anesthetic Order Entry) o Lidocaine applied to wound bed Edema Control - Lymphedema / Segmental Compressive Device / Other o Optional: One layer of unna paste to top of compression wrap (to act as an anchor). - PLEASE when applying wrap start from toes and go up to just below knee o Elevate, Exercise Daily and Avoid Standing for Long Periods of Time. o Elevate legs to the level of the heart and pump ankles as often as possible o Elevate leg(s) parallel to the floor when sitting. o DO YOUR BEST to sleep in the bed at night. DO NOT sleep in your recliner. Long hours of sitting in a recliner leads to swelling of the legs and/or potential wounds on your backside. Additional Orders / Instructions o Follow Nutritious Diet and Increase Protein Intake Wound Treatment Wound #1 - Lower Leg Wound Laterality: Left, Medial Cleanser: Wound Cleanser (Home Health) 3 x Per Week/30 Days Discharge Instructions:  Wash your hands with soap and water. Remove old dressing, discard into plastic bag and  place into trash. Cleanse the wound with Wound Cleanser prior to applying a clean dressing using gauze sponges, not tissues or cotton balls. Do not scrub or use excessive force. Pat dry using gauze sponges, not tissue or cotton balls. Peri-Wound Care: Nystatin Cream USP 30 (g) 3 x Per Week/30 Days Discharge Instructions: Use Nystatin Cream as directed. MIx 1:1 with TCA cream and applied to peri wound Peri-Wound Care: Triamcinolone Acetonide Cream, 0.1%, 15 (g) tube 3 x Per Week/30 Days Discharge Instructions: Apply as directed. mix 1:1 with nystatin cream for peri wound Primary Dressing: Aquacel Extra Hydrofiber Dressing, 4x5 (in/in) 3 x Per Week/30 Days Primary Dressing: keystone compound 3 x Per Week/30 Days Discharge Instructions: apply to wound bed as directed on medication label PLEASE follow direction on pill bottle for proper mixing Secondary Dressing: Zetuvit Absorbent Pad, 4x8 (in/in) 3 x Per Week/30 Days Compression Wrap: 3-LAYER WRAP - Profore Lite LF 3 Multilayer Compression Bandaging System 3 x Per Week/30 Days Discharge Instructions: Apply 3 multi-layer wrap as prescribed. Laura, Santiago ([id number]) Electronic Signature(s) Signed: 08/09/2022 1:04:39 PM By: Kalman Shan DO Entered By: Kalman Shan on 08/09/2022 12:19:16 Laura, Santiago ([id number]) -------------------------------------------------------------------------------- Problem List Details Patient Name: Laura, Santiago. Date of Service: 08/09/2022 10:30 AM Medical Record Number: [id number] Patient Account Number: [id number] Date of Birth/Sex: October 04, 1946 (76 y.o. Female) Treating RN: Cornell Barman Primary Care Provider: [facility], [place] Other Clinician: [name] Referring Provider: [facility] Treating Provider/Extender: Yaakov Guthrie in Treatment: 72 Active Problems ICD-10 Encounter Code  Description Active Date MDM Diagnosis L97.822 Non-pressure chronic ulcer of other part of left lower leg with fat layer 11/02/2021 No Yes exposed I87.312 Chronic venous hypertension (idiopathic) with ulcer of left lower 11/02/2021 No Yes extremity I87.2 Venous insufficiency (chronic) (peripheral) 07/06/2021 No Yes Z79.01 Long term (current) use of anticoagulants 07/06/2021 No Yes I10 Essential (primary) hypertension 07/06/2021 No Yes 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 07/06/2021 No Yes Inactive Problems ICD-10 Code Description Active Date Inactive Date S81.802A Unspecified open wound, left lower leg, initial encounter 07/06/2021 07/06/2021 S91.101A Unspecified open wound of right great toe without damage to nail, initial 08/24/2021 08/24/2021 encounter S91.104A Unspecified open wound of right lesser toe(s) without damage to nail, initial 08/24/2021 08/24/2021 encounter Resolved Problems ICD-10 Code Description Active Date Resolved Date S91.104D Unspecified open wound of right lesser toe(s) without damage to nail, 08/31/2021 08/31/2021 subsequent encounter Laura, Santiago ([id number]) S91.201D Unspecified open wound of right great toe with damage to nail, subsequent 08/31/2021 08/31/2021 encounter Electronic Signature(s) Signed: 08/09/2022 1:04:39 PM By: Kalman Shan DO Entered By: Kalman Shan on 08/09/2022 12:17:17 Laura Santiago ([id number]) -------------------------------------------------------------------------------- Progress Note Details Patient Name: Laura Santiago. Date of Service: 08/09/2022 10:30 AM Medical Record Number: [id number] Patient Account Number: [id number] Date of Birth/Sex: May 12, 1946 (76 y.o. Female) Treating RN: Cornell Barman Primary Care Provider: [facility], [place] Other Clinician: [name] Referring Provider: [facility] Treating Provider/Extender: Yaakov Guthrie in Treatment: 47 Subjective Chief  Complaint Information obtained from Patient Left lower extremity wound Right toe wounds Left upper lateral thigh wounds History of Present Illness (HPI) Admission 7/27 Ms. Torra Pala is a 76 year old female with a past medical history of ADHD, metastatic breast cancer, stage IV chronic kidney disease, history of DVT on Xarelto and chronic venous insufficiency that presents to the clinic for a chronic left lower extremity wound. She recently moved to [facility] 4 days ago. She was being followed by wound care center in [name].  She reports a 10-year history of wounds to her left lower extremity that eventually do heal with debridement and compression therapy. She states that the current wound reopened 4 months ago and she is using Vaseline and Coban. She denies signs of infection. 8/3; patient presents for 1 week follow-up. She reports no issues or complaints today. She states she had vascular studies done in the last week. She denies signs of infection. She brought her little service dog with her today. 8/17; patient presents for follow-up. She has missed her last clinic appointment. She states she took the wrap off and attempted to rewrap her leg. She is having difficulty with transportation. She has her service dog with her today. Overall she feels well and reports improvement in wound healing. She denies signs of infection. She reports owning an old Velcro wrap compression and has this at her living facility 9/14; patient presents for follow-up. Patient states that over the past 2 to 3 weeks she developed toe wounds to her right foot. She attributes this to tight fitting shoes. She subsequently developed cellulitis in the right leg and has been treated by doxycycline by her oncologist. She reports improvement in symptoms however continues to have some redness and swelling to this leg. To the left lower extremity patient has been having her wraps changed with home health twice  weekly. She states that the [facility] is not helping control the drainage. Other than that she has no issues or complaints today. She denies signs of infection to the left lower extremity. 9/21; patient presents for follow-up. She reports seeing infectious disease for her cellulitis. She reports no further management. She has home health that changes the wraps twice weekly. She has no issues or complaints today. She denies signs of infection. 10/5; patient presents for follow-up. She has no issues or complaints today. She denies signs of infection. She states that the right great toe has not been dressed by home health. 10/12; patient presents for follow-up. She has no issues or complaints today. She reports improvement in her wound healing. She has been using silver alginate to the right great toe wound. She denies signs of infection. 10/26; patient presents for follow-up. Home health did not have sorbact so they continued to use Hydrofera Blue under the wrap. She has been using silver alginate to the great toe wound however she did not have a dressing in place today. She currently denies signs of infection. 11/2; patient presents for follow-up. She has been using sorb act under the compression wrap. She reports using silver alginate to the toe wound again she does not have a dressing in place. She currently denies signs of infection. 11/23; patient presents for follow-up. Unfortunately she has missed her last 2 clinic appointments. She was last seen 3 weeks ago. She did her own compression wrap with Kerlix and Coban yesterday after seeing vein and vascular. She has not been dressing her right great toe wound. She currently denies signs of infection. 11/30; patient presents for 1 week follow-up. She states she changed her dressing last week prior to home health and use sorb act with Dakin's and Hydrofera Blue. Home health has changed the dressing as well and they have been using sorbact. Today  she reports increased redness to her right lower extremity. She has a history of cellulitis to this leg. She has been using silver alginate to the right great toe. Unfortunately she had an episode of diarrhea prior to coming in and had feces all over  the right leg and to the wrap of her left leg. 12/7; patient presents for 1 week follow-up. She states that home health did not come out to change the dressing and she took it off yesterday. It is unclear if she is dressing the right toe wound. She denies signs of infection. 12/14; patient presents for 1 week follow-up. She has no issues or complaints today. 12/21; patient presents for follow-up. She has no issues or complaints today. She denies signs of infection. 12/28/2021; patient presents for follow-up. She was hospitalized for sepsis secondary to right lower extremity cellulitis On 12/23. She states she is currently at a SNF. She states that she was started on doxycycline this morning for her right great toe swelling and redness. She is not sure what dressings have been done to her left lower extremity for the past 3 weeks. She says its been mainly gauze with an Ace wrap. 1/25; patient presents for follow-up. She is still residing in a skilled nursing facility. She reports mild pain to the left lower extremity wound bed. She states she is going to see a podiatrist soon. 2/8; patient presents for follow-up. She has moved back to her residential community from her skilled nursing facility. She has no issues or complaints today. She denies signs of systemic infections. Laura, Santiago ([id number]) 2/15; patient presents for follow-up. He has no issues or complaints today. She denies systemic signs of infection. 2/22; patient presents for follow-up. She has no issues or complaints today. She denies signs of infection. 3/1; patient presents for follow-up. She states that home health came out the day after she was seen in our clinic and yesterday to do  the wrap change. She denies signs of infection. She reports excoriated skin on the ankle. 3/8; patient presents for follow-up. She has no issues or complaints today. She denies signs of infection. 3/15; patient presents for follow-up. Home health has been coming out to change the dressings. She reports more tenderness to the wound site. She denies purulent drainage, increased warmth or erythema to the area. 4/5; patient presents for follow-up. She has missed her last 2 clinic appointments. I have not seen her in 3 weeks. She was recently hospitalized for altered mental status. She was involuntarily committed. She was evaluated by psychiatry and deemed to have competency. There was no specific cause of her altered mental status. It was concluded that her physical and mental health were declining due to her chronic medical conditions. Currently home health has been coming out for dressing changes. Patient has also been doing her own dressing changes. She reports more skin breakdown to the periwound and now has a new wound. She denies fever/chills. She reports continued tenderness to the wound site. 4/12; patient with significant venous insufficiency and a large wound on her left lower leg taking up about 80% of the circumference of her lower leg. Cultures of this grew MRSA and Pseudomonas. She had completed a course of ciprofloxacin now is starting doxycycline. She has been using Dakin's wet-to-dry and a Tubigrip. She has home health twice a week and we change it once. 4/19; patient presents for follow-up. She completed her course of doxycycline. She has been using Dakin's wet-to-dry dressing and Tubigrip. Home health changes the dressing twice weekly. Currently she has no issues or complaints. 4/26; patient presents for follow-up. At last clinic visit orders for home health were Iodosorb under compression therapy. Unfortunately they did not have the dressing and have been using Dakin's and gentamicin  under the wrap. Patient currently denies signs of infection. She has no issues or complaints today. 5/3; patient presents for follow-up. Again Iodosorb has not been used under the compression therapy when home health comes out to change the wrap and dressing. They have been using Sorbact. It is unclear why this is happening since we send orders weekly to the agency. She denies signs of infection. Patient has not purchased the [place] antibiotics. We reached out to the company and they said they have been trying to contact her on a regular basis. We gave the patient the number to call to order the medication. 5/10; patient presents for follow-up. She has no issues or complaints today. Again home health has not been using Iodosorb. Mepilex was on the wound bed. No other dressings noted. She brought in her Keystone antibiotics. She denies signs of infection. 5/17; patient presents for follow-up. Home health has come out twice since she was last seen. Joint well she has been using Keystone antibiotic with Sorbact under the compression wrap. She has no issues or complaints today. She denies signs of infection. 5/24; patient presents for follow-up. We have been using Keystone antibiotics with Sorbact under compression therapy. She is tolerating the treatment well. She is reporting improvement in wound healing. She denies signs of infection. 5/31; patient presents for follow-up. We continue to do [facility] antibiotics with Sorbact under compression therapy. She continues to report improvement in wound healing. Home health comes out and changes the dressing once weekly. 05-17-2022 upon evaluation today patient appears to be doing better in regard to her wound especially compared to the last time I saw her. Fortunately I do think that she is seeing improvements. With that being said I do believe that she may be benefit from sharp debridement today to clear away some of the necrotic debris I discussed that with  her as well. She is an amendable to that plan. Otherwise she is very pleased with how the [employer] is doing for her. 6/14; patient presents for follow-up. We have been using Keystone antibiotic with Sorbact and absorbent dressings under 3 layer compression. She has no issues or complaints today. She reports improvement in wound healing. She denies signs of infection. 6/21; patient presents for follow-up. We are continuing with [facility] antibiotic and Sorbact under 3 layer compression. Patient has no complaints. Continued wound healing is happening. She denies signs of infection. 6/28; patient presents for follow-up. We have been using Keystone antibiotic with Sorbact under 3 layer compression. Usually home health comes out and changes the dressing twice a week. Unfortunately they did not go out to change the dressing. It is unclear why. Patient did not call them. She currently denies signs of infection. 7/5; patient presents for follow-up. We have been using Keystone antibiotic with calcium alginate under 3 layer compression. She reports improvement in wound healing. She denies signs of infection. Home health has come out to do dressing changes twice this past week. 7/12; patient presents for follow-up. We have been using Keystone antibiotic with calcium alginate under 3 layer compression. Patient states that home health came out once last week to change the dressing. She reports improvement in wound healing. She currently denies signs of infection. 7/19; patient presents for follow-up. We have been using Keystone antibiotic with calcium alginate under 3 layer compression. Home health came out once last week to change the dressing. She has no issues or complaints today. She denies signs of infection. 8/2; patient presents for follow-up. We have been  using Keystone antibiotic with calcium alginate under 3 layer compression. Unfortunately she missed her appointment last week and home health did not  come out to do dressing changes. Patient currently denies signs of infection. 8/9; patient presents for follow-up. We have been using Keystone with calcium alginate under 3 layer compression. She states that home health came out once last week. She currently denies signs of infection. Her wrap was completely wet. She states she was cleaning the top of the leg and water soaked down into the wrap. 8/16; patient presents for follow-up. We have been using Keystone with calcium alginate under 3 layer compression. She states that home health came out twice last week. She has no issues or complaints today. Laura, Santiago ([id number]) 8/23; patient presents for follow-up. He has been using Keystone with calcium alginate under 3 layer compression. Home health came out twice last week. She denies signs of infection. 8/30; patient presents for follow-up. We have been using Keystone with calcium alginate under 3 layer compression. Home health came out once last week to change the dressing. Patient reports improvement in wound healing. She states she is almost done with her chemotherapy infusions and has 1 more left. Objective Constitutional Vitals Time Taken: 11:07 AM, Height: 66 in, Weight: 153 lbs, BMI: 24.7, Temperature: 98.1 F, Pulse: 72 bpm, Respiratory Rate: 18 breaths/min, Blood Pressure: 127/69 mmHg. General Notes: Left lower extremity: Large open wound with granulation tissue and nonviable tissue. No signs of surrounding soft tissue infection. Integumentary (Hair, Skin) Wound #1 status is Open. Original cause of wound was Gradually Appeared. The date acquired was: 04/06/2021. The wound has been in treatment 57 weeks. The wound is located on the Left,Medial Lower Leg. The wound measures 9.4cm length x 10.9cm width x 0.2cm depth; 80.472cm^2 area and 16.094cm^3 volume. There is Fat Layer (Subcutaneous Tissue) exposed. There is a medium amount of serosanguineous drainage noted. The wound margin is  flat and intact. There is medium (34-66%) red, pink granulation within the wound bed. There is a medium (34-66%) amount of necrotic tissue within the wound bed including Adherent Slough. 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 I debrided nonviable tissue. I recommended continuing the course with [place] [facility] antibiotic and Aquacel under 3 layer compression. Follow-up in 1 week. 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 There was a Excisional Skin/Subcutaneous Tissue Debridement with a total area of 102.46 sq cm performed by Kalman Shan, MD. With the following instrument(s): Curette to remove Viable and Non-Viable tissue/material. Material removed includes Subcutaneous Tissue and Slough and. A time out was conducted at 11:27, prior to the start of the procedure. A Minimum amount of bleeding was controlled with Pressure. The procedure was tolerated well. Post Debridement Measurements: 9.4cm length x 10.9cm width x 0.3cm depth; 24.142cm^3 volume. Character of Wound/Ulcer Post Debridement is stable. Severity of Tissue Post Debridement is: Fat layer exposed. Post procedure Diagnosis Wound #1: Same as Pre-Procedure Plan Laura, Santiago ([id number]) Follow-up Appointments: Return Appointment in 1 week. Nurse Visit as needed Home Health: [place]: - [place] for  wound care. May utilize formulary equivalent dressing for wound treatment orders  unless otherwise specified. Home Health Nurse may visit PRN to address patient s wound care needs. **Please direct any NON-WOUND related issues/requests for orders to patient's Primary Care Physician. **If current dressing causes regression in wound condition, may D/C ordered dressing product/s and apply Normal Saline Moist Dressing daily until next [place] or Other MD appointment. **Notify [facility] of regression in wound condition at [phone]. Other Home Health Orders/Instructions: - Dressing change 3 x weekly, twice by home health and once at wound clinic weekly. PLEASE make sure frequency between dressing changes is appropriate. Bathing/ Shower/ Hygiene: May shower with wound dressing protected with water repellent cover or cast protector. No tub bath. Anesthetic (Use 'Patient Medications' Section for Anesthetic Order Entry): Lidocaine applied to wound bed Edema Control - Lymphedema / Segmental Compressive Device / Other: Optional: One layer of unna paste to top of compression wrap (to act as an anchor). - PLEASE when applying wrap start from toes and go up to just below knee Elevate, Exercise Daily and Avoid Standing for Long Periods of Time. Elevate legs to the level of the heart and pump ankles as often as possible Elevate leg(s) parallel to the floor when sitting. DO YOUR BEST to sleep in the bed at night. DO NOT sleep in your recliner. Long hours of sitting in a recliner leads to swelling of the legs and/or potential wounds on your backside. Additional Orders / Instructions: Follow Nutritious Diet and Increase Protein Intake WOUND #1: - Lower Leg Wound Laterality: Left, Medial Cleanser: Wound Cleanser (Home Health) 3 x Per Week/30 Days Discharge Instructions: Wash your hands with soap and water. Remove old dressing, discard into plastic bag and place into trash. Cleanse the wound with Wound Cleanser prior to applying a clean dressing using gauze sponges,  not tissues or cotton balls. Do not scrub or use excessive force. Pat dry using gauze sponges, not tissue or cotton balls. Peri-Wound Care: Nystatin Cream USP 30 (g) 3 x Per Week/30 Days Discharge Instructions: Use Nystatin Cream as directed. MIx 1:1 with TCA cream and applied to peri wound Peri-Wound Care: Triamcinolone Acetonide Cream, 0.1%, 15 (g) tube 3 x Per Week/30 Days Discharge Instructions: Apply as directed. mix 1:1 with nystatin cream for peri wound Primary Dressing: Aquacel Extra Hydrofiber Dressing, 4x5 (in/in) 3 x Per Week/30 Days Primary Dressing: keystone compound 3 x Per Week/30 Days Discharge Instructions: apply to wound bed as directed on medication label PLEASE follow direction on pill bottle for proper mixing Secondary Dressing: Zetuvit Absorbent Pad, 4x8 (in/in) 3 x Per Week/30 Days Compression Wrap: 3-LAYER WRAP - Profore Lite LF 3 Multilayer Compression Bandaging System 3 x Per Week/30 Days Discharge Instructions: Apply 3 multi-layer wrap as prescribed. 1. In office sharp debridement 2. Aquacel under 3 layer compression 3. Follow-up in 1 week Electronic Signature(s) Signed: 08/09/2022 1:04:39 PM By: Kalman Shan DO Entered By: Kalman Shan on 08/09/2022 12:18:53 Laura, DARROUGH ([id number]) -------------------------------------------------------------------------------- SuperBill Details Patient Name: Laura Santiago. Date of Service: 08/09/2022 Medical Record Number: [id number] Patient Account Number: [id number] Date of Birth/Sex: [birth year] (76 y.o. Female) Treating RN: Cornell Barman Primary Care Provider: [facility], [place] Other Clinician: [name] Referring Provider: [facility] Treating Provider/Extender: Yaakov Guthrie in Treatment: 16 Diagnosis Coding ICD-10 Codes Code Description [phone] 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.312 Chronic venous hypertension (idiopathic)  with ulcer of left lower  extremity I87.2 Venous insufficiency (chronic) (peripheral) Z79.01 Long term (current) use of anticoagulants I10 Essential (primary) hypertension 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 Facility Procedures CPT4 Code: [id number] Description: 82641 - DEB SUBQ TISSUE 20 SQ CM/< Modifier: Quantity: 1 CPT4 Code: Description: ICD-10 Diagnosis Description L97.822 Non-pressure chronic ulcer of other part of left lower leg with fat layer Modifier: exposed Quantity: CPT4 Code: [id number] Description: 68088 - DEB SUBQ TISS EA ADDL 20CM Modifier: Quantity: 5 CPT4 Code: Description: ICD-10 Diagnosis Description L97.822 Non-pressure chronic ulcer of other part of left lower leg with fat layer Modifier: exposed Quantity: Physician Procedures CPT4 Code: [id number] Description: 45859 - WC PHYS SUBQ TISS 20 SQ CM Modifier: Quantity: 1 CPT4 Code: Description: ICD-10 Diagnosis Description L97.822 Non-pressure chronic ulcer of other part of left lower leg with fat layer Modifier: exposed Quantity: CPT4 Code: [id number] Description: 86381 - WC PHYS SUBQ TISS EA ADDL 20 CM Modifier: Quantity: 5 CPT4 Code: Description: ICD-10 Diagnosis Description L97.822 Non-pressure chronic ulcer of other part of left lower leg with fat layer Modifier: exposed Quantity: Electronic Signature(s) Signed: 08/09/2022 1:04:39 PM By: Kalman Shan DO Entered By: Kalman Shan on 08/09/2022 12:19:08

## 2022-08-11 NOTE — Progress Notes (Signed)
BRICE, POTTEIGER ([id number]) Visit Report for 08/09/2022 Arrival Information Details Patient Name: Laura Santiago, Laura Santiago. Date of Service: 08/09/2022 10:30 AM Medical Record Number: [id number] Patient Account Number: [id number] Date of Birth/Sex: Nov 19, 1946 (76 y.o. Female) Treating RN: Cornell Barman Primary Care Damaree Sargent: [facility], [place] Other Clinician: [name] Referring [place] Fant: [facility] Treating Adi Seales/Extender: Yaakov Guthrie in Treatment: 30 Visit Information History Since Last Visit All ordered tests and consults were completed: No Patient Arrived: [name] Added or deleted any medications: No Arrival Time: 11:00 Any new allergies or adverse reactions: No Transfer Assistance: None Had a fall or experienced change in No Patient Requires Transmission-Based No activities of daily living that may affect Precautions: risk of falls: Patient Has Alerts: Yes Hospitalized since last visit: No Patient Alerts: PT HAS SERVICE Pain Present Now: Yes ANIMAL ABI 07/11/21 R) 1.16 L) 1.27 Electronic Signature(s) Signed: 08/10/2022 4:44:47 PM By: [name] Entered By: [name] on 08/09/2022 11:04:07 Laura Santiago ([id number]) -------------------------------------------------------------------------------- Clinic Level of Care Assessment Details Patient Name: Laura Santiago. Date of Service: 08/09/2022 10:30 AM Medical Record Number: [id number] Patient Account Number: [id number] Date of Birth/Sex: 10-03-1946 (76 y.o. Female) Treating RN: Cornell Barman Primary Care Hannah Crill: [facility], [place] Other Clinician: [name] Referring Aikeem Lilley: [facility] Treating Temiloluwa Recchia/Extender: Yaakov Guthrie in Treatment: 48 Clinic Level of Care Assessment Items TOOL 1 Quantity Score []  - Use when EandM and Procedure is performed on INITIAL visit 0 ASSESSMENTS - Nursing Assessment / Reassessment []  - General Physical Exam (combine w/  comprehensive assessment (listed just below) when performed on new 0 pt. evals) []  - 0 Comprehensive Assessment (HX, ROS, Risk Assessments, Wounds Hx, etc.) ASSESSMENTS - Wound and Skin Assessment / Reassessment []  - Dermatologic / Skin Assessment (not related to wound area) 0 ASSESSMENTS - Ostomy and/or Continence Assessment and Care []  - Incontinence Assessment and Management 0 []  - 0 Ostomy Care Assessment and Management (repouching, etc.) PROCESS - Coordination of Care []  - Simple Patient / Family Education for ongoing care 0 []  - 0 Complex (extensive) Patient / Family Education for ongoing care []  - 0 Staff obtains Programmer, systems, Records, Test Results / Process Orders []  - 0 Staff telephones HHA, Nursing Homes / Clarify orders / etc []  - 0 Routine Transfer to another Facility (non-emergent condition) []  - 0 Routine Hospital Admission (non-emergent condition) []  - 0 New Admissions / Biomedical engineer / Ordering NPWT, Apligraf, etc. []  - 0 Emergency Hospital Admission (emergent condition) PROCESS - Special Needs []  - Pediatric / Minor Patient Management 0 []  - 0 Isolation Patient Management []  - 0 Hearing / Language / Visual special needs []  - 0 Assessment of Community assistance (transportation, D/C planning, etc.) []  - 0 Additional assistance / Altered mentation []  - 0 Support Surface(s) Assessment (bed, cushion, seat, etc.) INTERVENTIONS - Miscellaneous []  - External ear exam 0 []  - 0 Patient Transfer (multiple staff / Civil Service fast streamer / Similar devices) []  - 0 Simple Staple / Suture removal (25 or less) []  - 0 Complex Staple / Suture removal (26 or more) []  - 0 Hypo/Hyperglycemic Management (do not check if billed separately) []  - 0 Ankle / Brachial Index (ABI) - do not check if billed separately Has the patient been seen at the hospital within the last three years: Yes Total Score: 0 Level Of Care: ____ Laura Santiago ([id number]) Electronic  Signature(s) Signed: 08/10/2022 4:44:47 PM By: [name] Entered By: [name] on 08/09/2022 11:34:42 Laura Santiago, Laura M. ([id number]) --------------------------------------------------------------------------------  Encounter Discharge Information Details Patient Name: SHANTEA, POULTON. Date of Service: 08/09/2022 10:30 AM Medical Record Number: [id number] Patient Account Number: [id number] Date of Birth/Sex: Feb 27, 1946 (76 y.o. Female) Treating RN: Cornell Barman Primary Care Marga Gramajo: [facility], [place] Other Clinician: [name] Referring Miriam Kestler: [facility], [facility] Treating Bronsen Serano/Extender: Yaakov Guthrie in Treatment: 85 Encounter Discharge Information Items Post Procedure Vitals Discharge Condition: Stable Temperature (F): 98.1 Ambulatory Status: Walker Pulse (bpm): 72 Discharge Destination: Home Respiratory Rate (breaths/min): 18 Transportation: Private Auto Blood Pressure (mmHg): 127/69 Accompanied By: self Schedule Follow-up Appointment: Yes Clinical Summary of Care: Electronic Signature(s) Signed: 08/10/2022 4:44:47 PM By: [name] Entered By: [name] on 08/09/2022 11:55:43 Laura Santiago ([id number]) -------------------------------------------------------------------------------- Lower Extremity Assessment Details Patient Name: Laura Santiago. Date of Service: 08/09/2022 10:30 AM Medical Record Number: [id number] Patient Account Number: [id number] Date of Birth/Sex: 11/07/1946 (76 y.o. Female) Treating RN: Cornell Barman Primary Care Joie Reamer: [facility], [place] Other Clinician: [name] Referring Michela Herst: [facility], [facility] Treating Kirti Carl/Extender: Yaakov Guthrie in Treatment: 57 Edema Assessment Assessed: [Left: Yes] [Right: No] Edema: [Left: Ye] [Right: s] Calf Left: Right: Point of Measurement: 30 cm From Medial Instep 31.5 cm Ankle Left: Right: Point of Measurement: 10 cm From Medial Instep 22.3  cm Vascular Assessment Pulses: Dorsalis Pedis Palpable: [Left:Yes] Electronic Signature(s) Signed: 08/10/2022 4:44:47 PM By: [name] Signed: 08/10/2022 6:20:35 PM By: Gretta Cool, BSN, RN, CWS, [name] RN, BSN Entered By: [name] on 08/09/2022 11:20:55 Laura Santiago, Laura Santiago ([id number]) -------------------------------------------------------------------------------- Multi Wound Chart Details Patient Name: Laura Santiago. Date of Service: 08/09/2022 10:30 AM Medical Record Number: [id number] Patient Account Number: [id number] Date of Birth/Sex: [birth year] (76 y.o. Female) Treating RN: Cornell Barman Primary Care Dereck Agerton: [facility], [place] Other Clinician: [name] Referring Jamiaya Bina: [facility] Treating Josiel Gahm/Extender: Yaakov Guthrie in Treatment: 52 Vital Signs Height(in): 66 Pulse(bpm): 72 Weight(lbs): 153 Blood Pressure(mmHg): 127/69 Body Mass Index(BMI): 24.7 Temperature(F): 98.1 Respiratory Rate(breaths/min): 18 Photos: [N/A:N/A] Wound Location: Left, Medial Lower Leg N/A N/A Wounding Event: Gradually Appeared N/A N/A Primary Etiology: Venous Leg Ulcer N/A N/A Comorbid History: Hypertension, Osteoarthritis, N/A N/A Received Chemotherapy, Received Radiation Date Acquired: 04/06/2021 N/A N/A Weeks of Treatment: 57 N/A N/A Wound Status: Open N/A N/A Wound Recurrence: No N/A N/A Clustered Wound: Yes N/A N/A Measurements L x W x D (cm) 9.4x10.9x0.2 N/A N/A Area (cm) : 80.472 N/A N/A Volume (cm) : 16.094 N/A N/A % Reduction in Area: 10.90% N/A N/A % Reduction in Volume: 10.90% N/A N/A Classification: Full Thickness Without Exposed N/A N/A Support Structures Exudate Amount: Medium N/A N/A Exudate Type: Serosanguineous N/A N/A Exudate Color: red, brown N/A N/A Wound Margin: Flat and Intact N/A N/A Granulation Amount: Medium (34-66%) N/A N/A Granulation Quality: Red, Pink N/A N/A Necrotic Amount: Medium (34-66%) N/A N/A Exposed  Structures: Fat Layer (Subcutaneous Tissue): N/A N/A Yes Fascia: No Tendon: No Muscle: No Joint: No Bone: No Epithelialization: Small (1-33%) N/A N/A Treatment Notes Electronic Signature(s) Signed: 08/10/2022 4:44:47 PM By: [name] Entered By: [name] on 08/09/2022 11:21:08 Laura Santiago ([id number]) Laura Santiago, Laura Santiago ([id number]) -------------------------------------------------------------------------------- Multi-Disciplinary Care Plan Details Patient Name: ALEEA, HENDRY. Date of Service: 08/09/2022 10:30 AM Medical Record Number: [id number] Patient Account Number: [id number] Date of Birth/Sex: 1946/09/08 (76 y.o. Female) Treating RN: Cornell Barman Primary Care Jazper Nikolai: [facility], [place] Other Clinician: [name] Referring Delrico Minehart: [facility] Treating Syed Zukas/Extender: Yaakov Guthrie in Treatment: 42 Active Inactive Soft Tissue Infection Nursing Diagnoses: Impaired tissue integrity Potential  for infection: soft tissue Goals: Patient's soft tissue infection will resolve Date Initiated: 03/15/2022 Target Resolution Date: 04/27/2022 Goal Status: Active Signs and symptoms of infection will be recognized early to allow for prompt treatment Date Initiated: 03/15/2022 Date Inactivated: 04/26/2022 Target Resolution Date: 04/27/2022 Goal Status: Met Interventions: Assess signs and symptoms of infection every visit Treatment Activities: Culture and sensitivity : 03/15/2022 Notes: Wound/Skin Impairment Nursing Diagnoses: Impaired tissue integrity Goals: Patient/caregiver will verbalize understanding of skin care regimen Date Initiated: 07/06/2021 Date Inactivated: 07/27/2021 Target Resolution Date: 07/06/2021 Goal Status: Met Ulcer/skin breakdown will have a volume reduction of 30% by week 4 Date Initiated: 07/06/2021 Date Inactivated: 10/12/2021 Target Resolution Date: 08/06/2021 Goal Status: Unmet Unmet Reason: cont tx Ulcer/skin  breakdown will have a volume reduction of 50% by week 8 Date Initiated: 07/06/2021 Target Resolution Date: 09/06/2021 Goal Status: Active Ulcer/skin breakdown will have a volume reduction of 80% by week 12 Date Initiated: 07/06/2021 Target Resolution Date: 10/06/2021 Goal Status: Active Ulcer/skin breakdown will heal within 14 weeks Date Initiated: 07/06/2021 Target Resolution Date: 11/06/2021 Goal Status: Active Interventions: Assess patient/caregiver ability to obtain necessary supplies Assess patient/caregiver ability to perform ulcer/skin care regimen upon admission and as needed Assess ulceration(s) every visit Treatment Activities: Referred to DME Hassie Mandt for dressing supplies : 07/06/2021 Skin care regimen initiated : 07/06/2021 Notes: Laura Santiago, Laura Santiago ([id number]) Electronic Signature(s) Signed: 08/10/2022 4:44:47 PM By: [name] Signed: 08/10/2022 6:20:35 PM By: Gretta Cool, BSN, RN, CWS, [name] RN, BSN Entered By: [name] on 08/09/2022 11:21:00 Laura Santiago, Laura Santiago ([id number]) -------------------------------------------------------------------------------- Pain Assessment Details Patient Name: Laura Santiago, Laura Santiago. Date of Service: 08/09/2022 10:30 AM Medical Record Number: [id number] Patient Account Number: [id number] Date of Birth/Sex: 05-25-1946 (76 y.o. Female) Treating RN: Cornell Barman Primary Care Kailash Hinze: [facility], [place] Other Clinician: [name] Referring Brixton Franko: [facility], [facility] Treating Kirsi Hugh/Extender: Yaakov Guthrie in Treatment: 49 Active Problems Location of Pain Severity and Description of Pain Patient Has Paino Yes Site Locations Pain Location: Pain in Ulcers Duration of the Pain. Constant / Intermittento Constant Rate the pain. Current Pain Level: 5 Character of Pain Describe the Pain: Burning, Cramping Pain Management and Medication Current Pain Management: Medication: Yes Rest: Yes Electronic Signature(s) Signed:  08/10/2022 4:44:47 PM By: [name] Signed: 08/10/2022 6:20:35 PM By: Gretta Cool, BSN, RN, CWS, [name] RN, BSN Entered By: [name] on 08/09/2022 11:08:28 Laura Santiago, Laura Santiago ([id number]) -------------------------------------------------------------------------------- Patient/Caregiver Education Details Patient Name: Laura Santiago, Laura Santiago. Date of Service: 08/09/2022 10:30 AM Medical Record Number: [id number] Patient Account Number: [id number] Date of Birth/Gender: June 09, 1946 (76 y.o. Female) Treating RN: Cornell Barman Primary Care Physician: [facility], [place] Other Clinician: [name] Referring Physician: [facility] Treating Physician/Extender: Yaakov Guthrie in Treatment: 54 Education Assessment Education Provided To: Patient Education Topics Provided Wound/Skin Impairment: Handouts: Other: continue wound care as directed Electronic Signature(s) Signed: 08/10/2022 4:44:47 PM By: [name] Entered By: [name] on 08/09/2022 11:35:04 Laura Santiago ([id number]) -------------------------------------------------------------------------------- Wound Assessment Details Patient Name: Laura Santiago. Date of Service: 08/09/2022 10:30 AM Medical Record Number: [id number] Patient Account Number: [id number] Date of Birth/Sex: December 08, 1946 (76 y.o. Female) Treating RN: Cornell Barman Primary Care Darnise Montag: [facility], [place] Other Clinician: [name] Referring Kino Dunsworth: [facility], [facility] Treating Sylvan Sookdeo/Extender: Yaakov Guthrie in Treatment: 57 Wound Status Wound Number: 1 Primary Venous Leg Ulcer Etiology: Wound Location: Left, Medial Lower Leg Wound Status: Open Wounding Event: Gradually Appeared Comorbid Hypertension, Osteoarthritis, Received Chemotherapy, Date Acquired: 04/06/2021 History: Received Radiation Weeks Of Treatment: 57 Clustered  Wound: Yes Photos Wound Measurements Length: (cm) 9.4 Width: (cm) 10.9 Depth: (cm)  0.2 Area: (cm) 80.472 Volume: (cm) 16.094 % Reduction in Area: 10.9% % Reduction in Volume: 10.9% Epithelialization: Small (1-33%) Wound Description Classification: Full Thickness Without Exposed Support Structures Wound Margin: Flat and Intact Exudate Amount: Medium Exudate Type: Serosanguineous Exudate Color: red, brown Foul Odor After Cleansing: No Slough/Fibrino Yes Wound Bed Granulation Amount: Medium (34-66%) Exposed Structure Granulation Quality: Red, Pink Fascia Exposed: No Necrotic Amount: Medium (34-66%) Fat Layer (Subcutaneous Tissue) Exposed: Yes Necrotic Quality: Adherent Slough Tendon Exposed: No Muscle Exposed: No Joint Exposed: No Bone Exposed: No Treatment Notes Wound #1 (Lower Leg) Wound Laterality: Left, Medial Cleanser Wound Cleanser Discharge Instruction: Wash your hands with soap and water. Remove old dressing, discard into plastic bag and place into trash. Cleanse the wound with Wound Cleanser prior to applying a clean dressing using gauze sponges, not tissues or cotton balls. Do not scrub or use excessive force. Pat dry using gauze sponges, not tissue or cotton balls. Laura Santiago, Laura Santiago ([id number]) Peri-Wound Care Nystatin Cream USP 30 (g) Discharge Instruction: Use Nystatin Cream as directed. MIx 1:1 with TCA cream and applied to peri wound Triamcinolone Acetonide Cream, 0.1%, 15 (g) tube Discharge Instruction: Apply as directed. mix 1:1 with nystatin cream for peri wound Topical Primary Dressing Aquacel Extra Hydrofiber Dressing, 4x5 (in/in) keystone compound Discharge Instruction: apply to wound bed as directed on medication label PLEASE follow direction on pill bottle for proper mixing Secondary Dressing Zetuvit Absorbent Pad, 4x8 (in/in) Secured With Compression Wrap 3-LAYER WRAP - Profore Lite LF 3 Multilayer Compression Bandaging System Discharge Instruction: Apply 3 multi-layer wrap as prescribed. Compression  Stockings Add-Ons Electronic Signature(s) Signed: 08/10/2022 4:44:47 PM By: [name] Signed: 08/10/2022 6:20:35 PM By: Gretta Cool, BSN, RN, CWS, [name] RN, BSN Entered By: [name] on 08/09/2022 11:20:14 Laura Santiago, Laura Santiago ([id number]) -------------------------------------------------------------------------------- [place] Details Patient Name: Laura Santiago, Laura Santiago. Date of Service: 08/09/2022 10:30 AM Medical Record Number: [id number] Patient Account Number: [id number] Date of Birth/Sex: [birth year] (76 y.o. Female) Treating RN: Cornell Barman Primary Care Tahjir Silveria: [facility], [place] Other Clinician: [name] Referring Vivien Barretto: [facility], [facility] Treating Rushawn Capshaw/Extender: Yaakov Guthrie in Treatment: 69 Vital Signs Time Taken: 11:07 Temperature (F): 98.1 Height (in): 66 Pulse (bpm): 72 Weight (lbs): 153 Respiratory Rate (breaths/min): 18 Body Mass Index (BMI): 24.7 Blood Pressure (mmHg): 127/69 Reference Range: 80 - 120 mg / dl Electronic Signature(s) Signed: 08/10/2022 4:44:47 PM By: [name] Entered By: [name] on 08/09/2022 11:08:24

## 2022-08-15 ENCOUNTER — Other Ambulatory Visit: Payer: Self-pay | Admitting: Oncology

## 2022-08-15 ENCOUNTER — Other Ambulatory Visit: Payer: Medicare Other | Admitting: Student

## 2022-08-15 ENCOUNTER — Other Ambulatory Visit: Payer: Self-pay | Admitting: *Deleted

## 2022-08-15 ENCOUNTER — Telehealth: Payer: Self-pay | Admitting: *Deleted

## 2022-08-15 DIAGNOSIS — Z515 Encounter for palliative care: Secondary | ICD-10-CM

## 2022-08-15 DIAGNOSIS — F411 Generalized anxiety disorder: Secondary | ICD-10-CM

## 2022-08-15 DIAGNOSIS — C50919 Malignant neoplasm of unspecified site of unspecified female breast: Secondary | ICD-10-CM

## 2022-08-15 DIAGNOSIS — R52 Pain, unspecified: Secondary | ICD-10-CM

## 2022-08-15 DIAGNOSIS — J019 Acute sinusitis, unspecified: Secondary | ICD-10-CM

## 2022-08-15 MED ORDER — GABAPENTIN 100 MG PO CAPS: 100.0000 mg | ORAL_CAPSULE | Freq: Three times a day (TID) | ORAL | 1 refills | Status: DC

## 2022-08-15 NOTE — Progress Notes (Signed)
ON PATHWAY REGIMEN - Breast  No Change  Continue With Treatment as Ordered.  Original Decision Date/Time: 06/28/2021 18:55     Cycle 1: A cycle is 21 days:     Pertuzumab      Trastuzumab-xxxx      Paclitaxel    Cycles 2 through 8: A cycle is every 21 days:     Pertuzumab      Trastuzumab-xxxx      Paclitaxel    Cycles 9 and beyond: A cycle is every 21 days:     Pertuzumab      Trastuzumab-xxxx   **Always confirm dose/schedule in your pharmacy ordering system**  Patient Characteristics: Distant Metastases or Locoregional Recurrent Disease - Unresected or Locally Advanced Unresectable Disease Progressing after Neoadjuvant and Local Therapies, HER2 Positive, ER Positive, Chemotherapy + HER2-Targeted Therapy, First Line Therapeutic Status: Distant Metastases ER Status: Positive (+) HER2 Status: Positive (+) PR Status: Negative (-) Line of Therapy: First Line Intent of Therapy: Non-Curative / Palliative Intent, Discussed with Patient

## 2022-08-15 NOTE — Progress Notes (Unsigned)
[facility] Palliative Care Consult Note Telephone: [phone]  Fax: [phone]    Date of encounter: 08/15/22 9:46 AM PATIENT NAME: Laura Santiago [id number]   [phone] (home)  DOB: September 24, 1946 MRN: [id number] PRIMARY CARE PROVIDER:    [place],  [place] [address] [phone]  REFERRING PROVIDER:   Laura [address] [place],  [place] [address] [phone]  RESPONSIBLE PARTY:    Contact Information     Name Relation Home Work Mobile   Laura Santiago Daughter   [phone]        I met face to face with patient in the home. Palliative Care was asked to follow this patient by consultation request of  [place] to address advance care planning and complex medical decision making. This is a follow up visit.                                   ASSESSMENT AND PLAN / RECOMMENDATIONS:   Advance Care Planning/Goals of Care: Goals include to maximize quality of life and symptom management. Patient/health care surrogate gave his/her permission to discuss. Our advance care planning conversation included a discussion about:    The value and importance of advance care planning  Experiences with loved ones who have been seriously ill or have died  Exploration of personal, cultural or spiritual beliefs that might influence medical decisions  Exploration of goals of care in the event of a sudden injury or illness  Identification of a healthcare agent  Review and updating or creation of an  advance directive document . Decision not to resuscitate or to de-escalate disease focused treatments due to poor prognosis. CODE STATUS:  Symptom Management/Plan:    Follow up Palliative Care Visit: Palliative care will continue to follow for complex medical decision making, advance care planning, and clarification of goals. Return *** weeks or prn.  I spent ***  minutes providing this consultation. More than 50% of the time in this consultation was spent in counseling and care coordination.  This visit was coded based on medical decision making (MDM).***  PPS: ***0%  HOSPICE ELIGIBILITY/DIAGNOSIS: TBD  Chief Complaint: Palliative Medicine follow up visit.   HISTORY OF PRESENT ILLNESS:  Laura Santiago is a 76 y.o. 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 Hx of DVT, CKD 3, ADHD, peripheral neuropathy secondary to chemotherapy.     Pain in lower legs, increased headache. She had been taking oxycodone 10 mg, although it had been increased to 15 mg. She does endorse improvement in the past 2 days. New onset of nasal stuffiness. States she is nearing the end. She states that she has upcoming scans; will determine if she will continue treatment. No falls; but does report near falls. Does report. Has a caregiver coming in 2 times a week; reports going well. Endorses sinus pain/pressure  History obtained from review of EMR, discussion with primary team, and interview with family, facility staff/caregiver and/or Laura Santiago.  I reviewed available labs, medications, imaging, studies and related documents from the EMR.  Records reviewed and summarized above.   ROS  A 10-point ROS is negative, except for the pertinent positive and negatives detailed per the HPI.  Physical Exam:  Constitutional: NAD General: frail appearing, thin/WNWD/obese  EYES:  anicteric sclera, lids intact, no discharge  ENMT: intact hearing, oral mucous membranes moist, dentition intact CV: S1S2, RRR, no LE edema Pulmonary: LCTA, no increased work of breathing, no cough, room air Abdomen: intake 100%, normo-active BS + 4 quadrants, soft and non tender, no ascites GU: deferred MSK: no sarcopenia, moves all extremities, ambulatory Skin: warm and dry, no rashes, wrap to RLE Neuro:  no generalized  weakness Psych: non-anxious affect, A and O x 3 Hem/lymph/immuno: no widespread bruising   Thank you for the opportunity to participate in the care of Laura Santiago.  The palliative care team will continue to follow. Please call our office at [phone] if we can be of additional assistance.   Laura Slocumb, NP   COVID-19 PATIENT SCREENING TOOL Asked and negative response unless otherwise noted:   Have you had symptoms of covid, tested positive or been in contact with someone with symptoms/positive test in the past 5-10 days? No

## 2022-08-15 NOTE — Telephone Encounter (Signed)
Laura Santiago wanted to get refill of gabapentin and the pain was worse. Dr Janese Banks said that she can have the gabapentin 100 mg tid. Also I put in my chart to her that pt can take ondansetron  1 hour before taking prep kit in order to get the prep down and hopefully she does not have problems GI but was told that it is side effects of the prep and we can't get a good picture unless it has contrast to know about the cancer.

## 2022-08-16 ENCOUNTER — Ambulatory Visit: Payer: Medicare Other | Admitting: Internal Medicine

## 2022-08-16 ENCOUNTER — Other Ambulatory Visit: Payer: Medicare Other | Admitting: Student

## 2022-08-18 ENCOUNTER — Other Ambulatory Visit: Payer: Self-pay

## 2022-08-18 ENCOUNTER — Encounter: Payer: Self-pay | Admitting: Oncology

## 2022-08-18 MED ORDER — ONDANSETRON HCL 8 MG PO TABS: 8.0000 mg | ORAL_TABLET | ORAL | 0 refills | Status: AC | PRN

## 2022-08-18 MED ORDER — AMPHETAMINE-DEXTROAMPHET ER 30 MG PO CP24: 30.0000 mg | ORAL_CAPSULE | Freq: Every day | ORAL | 0 refills | Status: DC

## 2022-08-21 NOTE — Progress Notes (Signed)
Last given herceptin/perjeta 8/8 - next appt is on 9/19.  Per MD do not reload.

## 2022-08-22 ENCOUNTER — Other Ambulatory Visit: Payer: Self-pay | Admitting: Oncology

## 2022-08-22 MED ORDER — OXYCODONE HCL 10 MG PO TABS: 15.0000 mg | ORAL_TABLET | Freq: Four times a day (QID) | ORAL | 0 refills | Status: DC | PRN

## 2022-08-23 ENCOUNTER — Encounter: Payer: Medicare Other | Attending: Internal Medicine | Admitting: Internal Medicine

## 2022-08-23 ENCOUNTER — Ambulatory Visit: Payer: Medicare Other

## 2022-08-23 DIAGNOSIS — I87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: Secondary | ICD-10-CM | POA: Diagnosis not present

## 2022-08-23 DIAGNOSIS — Z7901 Long term (current) use of anticoagulants: Secondary | ICD-10-CM | POA: Diagnosis not present

## 2022-08-23 DIAGNOSIS — N184 Chronic kidney disease, stage 4 (severe): Secondary | ICD-10-CM | POA: Diagnosis not present

## 2022-08-23 DIAGNOSIS — I129 Hypertensive chronic kidney disease with stage 1 through stage 4 chronic kidney disease, or unspecified chronic kidney disease: Secondary | ICD-10-CM | POA: Diagnosis not present

## 2022-08-23 DIAGNOSIS — Z8614 Personal history of Methicillin resistant Staphylococcus aureus infection: Secondary | ICD-10-CM | POA: Diagnosis not present

## 2022-08-23 DIAGNOSIS — C79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: Secondary | ICD-10-CM | POA: Insufficient documentation

## 2022-08-23 DIAGNOSIS — Z86718 Personal history of other venous thrombosis and embolism: Secondary | ICD-10-CM | POA: Diagnosis not present

## 2022-08-23 DIAGNOSIS — L97822 Non-pressure chronic ulcer of other part of left lower leg with fat layer exposed: Secondary | ICD-10-CM | POA: Diagnosis not present

## 2022-08-23 DIAGNOSIS — I872 Venous insufficiency (chronic) (peripheral): Secondary | ICD-10-CM | POA: Insufficient documentation

## 2022-08-24 ENCOUNTER — Ambulatory Visit
Admission: RE | Admit: 2022-08-24 | Discharge: 2022-08-24 | Disposition: A | Discharge status: Home / Self Care | Payer: Medicare Other | Source: Ambulatory Visit | Attending: Nurse Practitioner | Admitting: Nurse Practitioner

## 2022-08-24 DIAGNOSIS — C7951 Secondary malignant neoplasm of bone: Secondary | ICD-10-CM | POA: Insufficient documentation

## 2022-08-24 DIAGNOSIS — C50919 Malignant neoplasm of unspecified site of unspecified female breast: Secondary | ICD-10-CM | POA: Diagnosis present

## 2022-08-24 MED ORDER — IOHEXOL 300 MG/ML  SOLN
75.0000 mL | Freq: Once | INTRAMUSCULAR | Status: AC | PRN
  Administered 2022-08-24: 75 mL via INTRAVENOUS

## 2022-08-24 NOTE — Progress Notes (Signed)
PARRIS, SIGNER ([id number]) Visit Report for 08/23/2022 Arrival Information Details Patient Name: Laura Santiago, Laura Santiago. Date of Service: 08/23/2022 10:30 AM Medical Record Number: [id number] Patient Account Number: [id number] Date of Birth/Sex: [birth year] (76 y.o. F) Treating RN: Cornell Barman Primary Care Shaterica Mcclatchy: [facility], [place] Other Clinician: [name] Referring Satoria Dunlop: [facility] Treating Kosta Schnitzler/Extender: Yaakov Guthrie in Treatment: 15 Visit Information History Since Last Visit All ordered tests and consults were completed: No Patient Arrived: Laura Santiago Added or deleted any medications: No Arrival Time: 11:04 Any new allergies or adverse reactions: No Transfer Assistance: None Had a fall or experienced change in No Patient Requires Transmission-Based No activities of daily living that may affect Precautions: risk of falls: Patient Has Alerts: Yes Hospitalized since last visit: No Patient Alerts: PT HAS SERVICE Pain Present Now: No ANIMAL ABI 07/11/21 R) 1.16 L) 1.27 Electronic Signature(s) Signed: 08/23/2022 5:04:43 PM By: [name] Entered By: [name] on 08/23/2022 11:05:00 Laura Santiago ([id number]) -------------------------------------------------------------------------------- Clinic Level of Care Assessment Details Patient Name: Laura Santiago. Date of Service: 08/23/2022 10:30 AM Medical Record Number: [id number] Patient Account Number: [id number] Date of Birth/Sex: December 07, 1946 (76 y.o. F) Treating RN: Cornell Barman Primary Care Delando Satter: [facility], [place] Other Clinician: [name] Referring Linwood Gullikson: [facility] Treating Wiletta Bermingham/Extender: Yaakov Guthrie in Treatment: 64 Clinic Level of Care Assessment Items TOOL 1 Quantity Score _0  - Use when EandM and Procedure is performed on INITIAL visit 0 ASSESSMENTS - Nursing Assessment / Reassessment _1  - General Physical Exam (combine w/ comprehensive  assessment (listed just below) when performed on new 0 pt. evals) _2  - 0 Comprehensive Assessment (HX, ROS, Risk Assessments, Wounds Hx, etc.) ASSESSMENTS - Wound and Skin Assessment / Reassessment _3  - Dermatologic / Skin Assessment (not related to wound area) 0 ASSESSMENTS - Ostomy and/or Continence Assessment and Care _4  - Incontinence Assessment and Management 0 _5  - 0 Ostomy Care Assessment and Management (repouching, etc.) PROCESS - Coordination of Care _6  - Simple Patient / Family Education for ongoing care 0 _7  - 0 Complex (extensive) Patient / Family Education for ongoing care _8  - 0 Staff obtains Programmer, systems, Records, Test Results / Process Orders _9  - 0 Staff telephones HHA, Nursing Homes / Clarify orders / etc _10  - 0 Routine Transfer to another Facility (non-emergent condition) _11  - 0 Routine Hospital Admission (non-emergent condition) _12  - 0 New Admissions / Biomedical engineer / Ordering NPWT, Apligraf, etc. _13  - 0 Emergency Hospital Admission (emergent condition) PROCESS - Special Needs _14  - Pediatric / Minor Patient Management 0 _15  - 0 Isolation Patient Management _16  - 0 Hearing / Language / Visual special needs _17  - 0 Assessment of Community assistance (transportation, D/C planning, etc.) _18  - 0 Additional assistance / Altered mentation _19  - 0 Support Surface(s) Assessment (bed, cushion, seat, etc.) INTERVENTIONS - Miscellaneous _20  - External ear exam 0 _21  - 0 Patient Transfer (multiple staff / Civil Service fast streamer / Similar devices) _22  - 0 Simple Staple / Suture removal (25 or less) _23  - 0 Complex Staple / Suture removal (26 or more) _24  - 0 Hypo/Hyperglycemic Management (do not check if billed separately) _25  - 0 Ankle / Brachial Index (ABI) - do not check if billed separately Has the patient been seen at the hospital within the last three years: Yes Total Score: 0 Level Of Care: ____ Laura Santiago ([id number]) Electronic Signature(s) Signed:  08/23/2022 5:04:43 PM By: [name] Entered By: [name] on 08/23/2022 11:35:47 Laura Santiago ([id number]) --------------------------------------------------------------------------------  Compression Therapy Details Patient Name: Laura Santiago, Laura Santiago. Date of Service: 08/23/2022 10:30 AM Medical Record Number: [id number] Patient Account Number: [id number] Date of Birth/Sex: 03-03-1946 (76 y.o. F) Treating RN: Cornell Barman Primary Care Marie Borowski: [facility], [place] Other Clinician: [name] Referring Onyx Edgley: [facility] Treating Lonnette Shrode/Extender: Yaakov Guthrie in Treatment: 38 Compression Therapy Performed for Wound Assessment: Wound #1 Left,Medial Lower Leg Performed By: [name], [name], Compression Type: Three Layer Pre Treatment ABI: 1.5 Post Procedure Diagnosis Same as Pre-procedure Electronic Signature(s) Signed: 08/23/2022 5:04:43 PM By: [name] Entered By: [name] on 08/23/2022 11:35:29 Laura Santiago ([id number]) -------------------------------------------------------------------------------- Encounter Discharge Information Details Patient Name: Laura Santiago. Date of Service: 08/23/2022 10:30 AM Medical Record Number: [id number] Patient Account Number: [id number] Date of Birth/Sex: [birth year] (76 y.o. F) Treating RN: Cornell Barman Primary Care Dacia Capers: [facility], [place] Other Clinician: [name] Referring Jonique Kulig: [facility], [facility] Treating Austine Wiedeman/Extender: Yaakov Guthrie in Treatment: 47 Encounter Discharge Information Items Post Procedure Vitals Discharge Condition: Stable Temperature (F): 98.0 Ambulatory Status: Walker Pulse (bpm): 73 Discharge Destination: Home Respiratory Rate (breaths/min): 18 Transportation: Private Auto Blood Pressure (mmHg): 146/71 Accompanied By: self Schedule Follow-up Appointment: No Clinical Summary of Care: Electronic Signature(s) Signed: 08/23/2022  5:04:43 PM By: [name] Entered By: [name] on 08/23/2022 11:59:03 Laura Santiago ([id number]) -------------------------------------------------------------------------------- Lower Extremity Assessment Details Patient Name: Laura Santiago. Date of Service: 08/23/2022 10:30 AM Medical Record Number: [id number] Patient Account Number: [id number] Date of Birth/Sex: Mar 09, 1946 (76 y.o. F) Treating RN: Cornell Barman Primary Care Aleene Swanner: [facility], [place] Other Clinician: [name] Referring Deloras Reichard: [facility], [facility] Treating Colbin Jovel/Extender: Yaakov Guthrie in Treatment: 59 Edema Assessment Assessed: [Left: Yes] [Right: No] Edema: [Left: Ye] [Right: s] Calf Left: Right: Point of Measurement: 30 cm From Medial Instep 31.7 cm Ankle Left: Right: Point of Measurement: 10 cm From Medial Instep 22.5 cm Vascular Assessment Pulses: Dorsalis Pedis Palpable: [Left:Yes] Electronic Signature(s) Signed: 08/23/2022 5:04:43 PM By: [name] Signed: 08/24/2022 11:19:49 AM By: Gretta Cool, BSN, RN, CWS, [name] RN, BSN Entered By: [name] on 08/23/2022 11:17:09 Laura Santiago ([id number]) -------------------------------------------------------------------------------- Multi Wound Chart Details Patient Name: Laura Santiago. Date of Service: 08/23/2022 10:30 AM Medical Record Number: [id number] Patient Account Number: [id number] Date of Birth/Sex: October 21, 1946 (76 y.o. F) Treating RN: Cornell Barman Primary Care Shadae Reino: [facility], [place] Other Clinician: [name] Referring Torrez Renfroe: [facility], [facility] Treating Carleah Yablonski/Extender: Yaakov Guthrie in Treatment: 79 Vital Signs Height(in): 66 Pulse(bpm): 56 Weight(lbs): 153 Blood Pressure(mmHg): 146/71 Body Mass Index(BMI): 24.7 Temperature(F): 98.0 Respiratory Rate(breaths/min): 18 Photos: [N/A:N/A] Wound Location: Left, Medial Lower Leg N/A N/A Wounding Event: Gradually Appeared N/A  N/A Primary Etiology: Venous Leg Ulcer N/A N/A Comorbid History: Hypertension, Osteoarthritis, N/A N/A Received Chemotherapy, Received Radiation Date Acquired: 04/06/2021 N/A N/A Weeks of Treatment: 59 N/A N/A Wound Status: Open N/A N/A Wound Recurrence: No N/A N/A Clustered Wound: Yes N/A N/A Measurements L x W x D (cm) 8.7x11.8x0.2 N/A N/A Area (cm) : 80.629 N/A N/A Volume (cm) : 16.126 N/A N/A % Reduction in Area: 10.70% N/A N/A % Reduction in Volume: 10.70% N/A N/A Classification: Full Thickness Without Exposed N/A N/A Support Structures Exudate Amount: Medium N/A N/A Exudate Type: Serosanguineous N/A N/A Exudate Color: red, brown N/A N/A Wound Margin: Flat and Intact N/A N/A Granulation Amount: Medium (34-66%) N/A N/A Granulation Quality: Red, Pink N/A N/A Necrotic Amount: Medium (34-66%) N/A N/A Exposed Structures: Fat Layer (Subcutaneous Tissue): N/A N/A Yes Fascia: No Tendon:  No Muscle: No Joint: No Bone: No Epithelialization: Small (1-33%) N/A N/A Treatment Notes Electronic Signature(s) Signed: 08/23/2022 5:04:43 PM By: [name] Entered By: [name] on 08/23/2022 11:17:22 Laura Santiago, Laura Santiago ([id number]) Laura Santiago, Laura Santiago ([id number]) -------------------------------------------------------------------------------- Boomer Details Patient Name: Laura Santiago, Laura Santiago. Date of Service: 08/23/2022 10:30 AM Medical Record Number: [id number] Patient Account Number: [id number] Date of Birth/Sex: 1946-03-20 (76 y.o. F) Treating RN: Cornell Barman Primary Care Terrilynn Postell: [facility], [place] Other Clinician: [name] Referring Vielka Klinedinst: [facility] Treating Jonpaul Lumm/Extender: Yaakov Guthrie in Treatment: 86 Active Inactive Soft Tissue Infection Nursing Diagnoses: Impaired tissue integrity Potential for infection: soft tissue Goals: Patient's soft tissue infection will resolve Date Initiated: 03/15/2022 Target  Resolution Date: 04/27/2022 Goal Status: Active Signs and symptoms of infection will be recognized early to allow for prompt treatment Date Initiated: 03/15/2022 Date Inactivated: 04/26/2022 Target Resolution Date: 04/27/2022 Goal Status: Met Interventions: Assess signs and symptoms of infection every visit Treatment Activities: Culture and sensitivity : 03/15/2022 Notes: Wound/Skin Impairment Nursing Diagnoses: Impaired tissue integrity Goals: Patient/caregiver will verbalize understanding of skin care regimen Date Initiated: 07/06/2021 Date Inactivated: 07/27/2021 Target Resolution Date: 07/06/2021 Goal Status: Met Ulcer/skin breakdown will have a volume reduction of 30% by week 4 Date Initiated: 07/06/2021 Date Inactivated: 10/12/2021 Target Resolution Date: 08/06/2021 Goal Status: Unmet Unmet Reason: cont tx Ulcer/skin breakdown will have a volume reduction of 50% by week 8 Date Initiated: 07/06/2021 Target Resolution Date: 09/06/2021 Goal Status: Active Ulcer/skin breakdown will have a volume reduction of 80% by week 12 Date Initiated: 07/06/2021 Target Resolution Date: 10/06/2021 Goal Status: Active Ulcer/skin breakdown will heal within 14 weeks Date Initiated: 07/06/2021 Target Resolution Date: 11/06/2021 Goal Status: Active Interventions: Assess patient/caregiver ability to obtain necessary supplies Assess patient/caregiver ability to perform ulcer/skin care regimen upon admission and as needed Assess ulceration(s) every visit Treatment Activities: Referred to DME Julieanne Hadsall for dressing supplies : 07/06/2021 Skin care regimen initiated : 07/06/2021 Notes: Laura Santiago, Laura Santiago ([id number]) Electronic Signature(s) Signed: 08/23/2022 5:04:43 PM By: [name] Signed: 08/24/2022 11:19:49 AM By: Gretta Cool, BSN, RN, CWS, [name] RN, BSN Entered By: [name] on 08/23/2022 11:17:15 Laura Santiago  ([id number]) -------------------------------------------------------------------------------- Pain Assessment Details Patient Name: Laura Santiago, Laura Santiago. Date of Service: 08/23/2022 10:30 AM Medical Record Number: [id number] Patient Account Number: [id number] Date of Birth/Sex: July 25, 1946 (76 y.o. F) Treating RN: Cornell Barman Primary Care Chisom Aust: [facility], [place] Other Clinician: [name] Referring Man Effertz: [facility] Treating Keyandra Swenson/Extender: Yaakov Guthrie in Treatment: 88 Active Problems Location of Pain Severity and Description of Pain Patient Has Paino No Site Locations Pain Management and Medication Current Pain Management: Electronic Signature(s) Signed: 08/23/2022 5:04:43 PM By: [name] Signed: 08/24/2022 11:19:49 AM By: Gretta Cool, BSN, RN, CWS, [name] RN, BSN Entered By: [name] on 08/23/2022 11:07:40 Laura Santiago, Laura Santiago ([id number]) -------------------------------------------------------------------------------- Patient/Caregiver Education Details Patient Name: Laura Santiago, Laura Santiago. Date of Service: 08/23/2022 10:30 AM Medical Record Number: [id number] Patient Account Number: [id number] Date of Birth/Gender: 1946/01/06 (76 y.o. F) Treating RN: Cornell Barman Primary Care Physician: [facility], [place] Other Clinician: [name] Referring Physician: [facility] Treating Physician/Extender: Yaakov Guthrie in Treatment: 19 Education Assessment Education Provided To: Patient Education Topics Provided Wound/Skin Impairment: Methods: Explain/Verbal Responses: State content correctly Electronic Signature(s) Signed: 08/23/2022 5:04:43 PM By: [name] Entered By: [name] on 08/23/2022 11:36:15 Laura Santiago ([id number]) -------------------------------------------------------------------------------- Wound Assessment Details Patient Name: Laura Santiago. Date of Service: 08/23/2022 10:30 AM Medical Record  Number: [id number] Patient Account  Number: [id number] Date of Birth/Sex: 05/31/1946 (76 y.o. F) Treating RN: Cornell Barman Primary Care Jamy Cleckler: [facility], [place] Other Clinician: [name] Referring Brailyn Killion: [facility], [facility] Treating Nadina Fomby/Extender: Yaakov Guthrie in Treatment: 59 Wound Status Wound Number: 1 Primary Venous Leg Ulcer Etiology: Wound Location: Left, Medial Lower Leg Wound Status: Open Wounding Event: Gradually Appeared Comorbid Hypertension, Osteoarthritis, Received Chemotherapy, Date Acquired: 04/06/2021 History: Received Radiation Weeks Of Treatment: 59 Clustered Wound: Yes Photos Wound Measurements Length: (cm) 8.7 Width: (cm) 11.8 Depth: (cm) 0.2 Area: (cm) 80.629 Volume: (cm) 16.126 % Reduction in Area: 10.7% % Reduction in Volume: 10.7% Epithelialization: Small (1-33%) Wound Description Classification: Full Thickness Without Exposed Support Structures Wound Margin: Flat and Intact Exudate Amount: Medium Exudate Type: Serosanguineous Exudate Color: red, brown Foul Odor After Cleansing: No Slough/Fibrino Yes Wound Bed Granulation Amount: Medium (34-66%) Exposed Structure Granulation Quality: Red, Pink Fascia Exposed: No Necrotic Amount: Medium (34-66%) Fat Layer (Subcutaneous Tissue) Exposed: Yes Necrotic Quality: Adherent Slough Tendon Exposed: No Muscle Exposed: No Joint Exposed: No Bone Exposed: No Treatment Notes Wound #1 (Lower Leg) Wound Laterality: Left, Medial Cleanser Wound Cleanser Discharge Instruction: Wash your hands with soap and water. Remove old dressing, discard into plastic bag and place into trash. Cleanse the wound with Wound Cleanser prior to applying a clean dressing using gauze sponges, not tissues or cotton balls. Do not scrub or use excessive force. Pat dry using gauze sponges, not tissue or cotton balls. Laura Santiago, Laura Santiago ([id number]) Peri-Wound Care Nystatin Cream USP 30 (g) Discharge  Instruction: Use Nystatin Cream as directed. MIx 1:1 with TCA cream and applied to peri wound Triamcinolone Acetonide Cream, 0.1%, 15 (g) tube Discharge Instruction: Apply as directed. mix 1:1 with nystatin cream for peri wound Topical Primary Dressing Aquacel Extra Hydrofiber Dressing, 4x5 (in/in) keystone compound Discharge Instruction: apply to wound bed as directed on medication label PLEASE follow direction on pill bottle for proper mixing Secondary Dressing Zetuvit Absorbent Pad, 4x8 (in/in) Secured With Compression Wrap 3-LAYER WRAP - Profore Lite LF 3 Multilayer Compression Bandaging System Discharge Instruction: Apply 3 multi-layer wrap as prescribed. Compression Stockings Add-Ons Electronic Signature(s) Signed: 08/23/2022 5:04:43 PM By: [name] Signed: 08/24/2022 11:19:49 AM By: Gretta Cool, BSN, RN, CWS, [name] RN, BSN Entered By: [name] on 08/23/2022 11:16:34 ETHYL, VILA ([id number]) -------------------------------------------------------------------------------- Vitals Details Patient Name: REITA, SHINDLER. Date of Service: 08/23/2022 10:30 AM Medical Record Number: [id number] Patient Account Number: [id number] Date of Birth/Sex: 1946/05/07 (76 y.o. F) Treating RN: Cornell Barman Primary Care Gomer France: [facility], [place] Other Clinician: [name] Referring Anyla Israelson: [facility] Treating Donjuan Robison/Extender: Yaakov Guthrie in Treatment: 22 Vital Signs Time Taken: 11:05 Temperature (F): 98.0 Height (in): 66 Pulse (bpm): 73 Weight (lbs): 153 Respiratory Rate (breaths/min): 18 Body Mass Index (BMI): 24.7 Blood Pressure (mmHg): 146/71 Reference Range: 80 - 120 mg / dl Electronic Signature(s) Signed: 08/23/2022 5:04:43 PM By: [name] Entered By: [name] on 08/23/2022 11:07:34

## 2022-08-24 NOTE — Progress Notes (Signed)
Laura Santiago, Laura Santiago ([id number]) Visit Report for 08/23/2022 Chief Complaint Document Details Patient Name: Laura Santiago, Laura Santiago. Date of Service: 08/23/2022 10:30 AM Medical Record Number: [id number] Patient Account Number: [id number] Date of Birth/Sex: 05-13-1946 (76 y.o. F) Treating RN: Laura Santiago Primary Care Provider: Hilda Santiago, [place] Other Clinician: Massie Santiago Referring Provider: [facility] Treating Provider/Extender: Laura Santiago in Treatment: 67 Information Obtained from: Patient Chief Complaint Left lower extremity wound Right toe wounds Left upper lateral thigh wounds Electronic Signature(s) Signed: 08/23/2022 12:08:47 PM By: Laura Shan DO Entered By: Laura Santiago on 08/23/2022 12:03:19 Laura Santiago ([id number]) -------------------------------------------------------------------------------- Debridement Details Patient Name: Laura Santiago. Date of Service: 08/23/2022 10:30 AM Medical Record Number: [id number] Patient Account Number: [id number] Date of Birth/Sex: 04/10/1946 (76 y.o. F) Treating RN: Laura Santiago Primary Care Provider: Hilda Santiago, [place] Other Clinician: Massie Santiago Referring Provider: [facility], [facility] Treating Provider/Extender: Laura Santiago in Treatment: 59 Debridement Performed for Wound #1 Left,Medial Lower Leg Assessment: Performed By: Physician Laura Shan, MD Debridement Type: Debridement Severity of Tissue Pre Debridement: Fat layer exposed Level of Consciousness (Pre- Awake and Alert procedure): Pre-procedure Verification/Time Out Yes - 11:30 Taken: Start Time: 11:30 Total Area Debrided (L x W): 8.7 (cm) x 11.8 (cm) = 102.66 (cm) Tissue and other material Viable, Non-Viable, Slough, Subcutaneous, Slough debrided: Level: Skin/Subcutaneous Tissue Debridement Description: Excisional Instrument: Curette Bleeding: Minimum Hemostasis Achieved: Pressure End Time: 11:34 Response  to Treatment: Procedure was tolerated well Level of Consciousness (Post- Awake and Alert procedure): Post Debridement Measurements of Total Wound Length: (cm) 8.7 Width: (cm) 11.8 Depth: (cm) 0.2 Volume: (cm) 16.126 Character of Wound/Ulcer Post Debridement: Stable Severity of Tissue Post Debridement: Fat layer exposed Post Procedure Diagnosis Same as Pre-procedure Electronic Signature(s) Signed: 08/23/2022 12:08:47 PM By: Laura Shan DO Signed: 08/23/2022 5:04:43 PM By: Laura Santiago Signed: 08/24/2022 11:19:49 AM By: Laura Santiago, BSN, RN, CWS, [name] RN, BSN Entered By: Laura Santiago on 08/23/2022 11:34:27 Laura Santiago, Laura Santiago ([id number]) -------------------------------------------------------------------------------- HPI Details Patient Name: Laura, Santiago. Date of Service: 08/23/2022 10:30 AM Medical Record Number: [id number] Patient Account Number: [id number] Date of Birth/Sex: 06-07-1946 (76 y.o. F) Treating RN: Laura Santiago Primary Care Provider: Hilda Santiago, [place] Other Clinician: Massie Santiago Referring Provider: [facility] Treating Provider/Extender: Laura Santiago in Treatment: 7 History of Present Illness HPI Description: Admission 7/27 Ms. Laura Santiago is a 76 year old female with a past medical history of ADHD, metastatic breast cancer, stage IV chronic kidney disease, history of DVT on Xarelto and chronic venous insufficiency that presents to the clinic for a chronic left lower extremity wound. She recently moved to [facility] 4 days ago. She was being followed by wound care center in [name]. She reports a 10-year history of wounds to her left lower extremity that eventually do heal with debridement and compression therapy. She states that the current wound reopened 4 months ago and she is using Vaseline and Coban. She denies signs of infection. 8/3; patient presents for 1 week follow-up. She reports no issues or complaints today. She  states she had vascular studies done in the last week. She denies signs of infection. She brought her little service dog with her today. 8/17; patient presents for follow-up. She has missed her last clinic appointment. She states she took the wrap off and attempted to rewrap her leg. She is having difficulty with transportation. She has her service dog with her today. Overall she feels well and reports improvement in wound healing. She denies signs  of infection. She reports owning an old Velcro wrap compression and has this at her living facility 9/14; patient presents for follow-up. Patient states that over the past 2 to 3 weeks she developed toe wounds to her right foot. She attributes this to tight fitting shoes. She subsequently developed cellulitis in the right leg and has been treated by doxycycline by her oncologist. She reports improvement in symptoms however continues to have some redness and swelling to this leg. To the left lower extremity patient has been having her wraps changed with home health twice weekly. She states that the [facility] is not helping control the drainage. Other than that she has no issues or complaints today. She denies signs of infection to the left lower extremity. 9/21; patient presents for follow-up. She reports seeing infectious disease for her cellulitis. She reports no further management. She has home health that changes the wraps twice weekly. She has no issues or complaints today. She denies signs of infection. 10/5; patient presents for follow-up. She has no issues or complaints today. She denies signs of infection. She states that the right great toe has not been dressed by home health. 10/12; patient presents for follow-up. She has no issues or complaints today. She reports improvement in her wound healing. She has been using silver alginate to the right great toe wound. She denies signs of infection. 10/26; patient presents for follow-up. Home health  did not have sorbact so they continued to use Hydrofera Blue under the wrap. She has been using silver alginate to the great toe wound however she did not have a dressing in place today. She currently denies signs of infection. 11/2; patient presents for follow-up. She has been using sorb act under the compression wrap. She reports using silver alginate to the toe wound again she does not have a dressing in place. She currently denies signs of infection. 11/23; patient presents for follow-up. Unfortunately she has missed her last 2 clinic appointments. She was last seen 3 weeks ago. She did her own compression wrap with Kerlix and Coban yesterday after seeing vein and vascular. She has not been dressing her right great toe wound. She currently denies signs of infection. 11/30; patient presents for 1 week follow-up. She states she changed her dressing last week prior to home health and use sorb act with Dakin's and Hydrofera Blue. Home health has changed the dressing as well and they have been using sorbact. Today she reports increased redness to her right lower extremity. She has a history of cellulitis to this leg. She has been using silver alginate to the right great toe. Unfortunately she had an episode of diarrhea prior to coming in and had feces all over the right leg and to the wrap of her left leg. 12/7; patient presents for 1 week follow-up. She states that home health did not come out to change the dressing and she took it off yesterday. It is unclear if she is dressing the right toe wound. She denies signs of infection. 12/14; patient presents for 1 week follow-up. She has no issues or complaints today. 12/21; patient presents for follow-up. She has no issues or complaints today. She denies signs of infection. 12/28/2021; patient presents for follow-up. She was hospitalized for sepsis secondary to right lower extremity cellulitis On 12/23. She states she is currently at a SNF. She states  that she was started on doxycycline this morning for her right great toe swelling and redness. She is not sure what dressings  have been done to her left lower extremity for the past 3 weeks. She says its been mainly gauze with an Ace wrap. 1/25; patient presents for follow-up. She is still residing in a skilled nursing facility. She reports mild pain to the left lower extremity wound bed. She states she is going to see a podiatrist soon. 2/8; patient presents for follow-up. She has moved back to her residential community from her skilled nursing facility. She has no issues or complaints today. She denies signs of systemic infections. 2/15; patient presents for follow-up. He has no issues or complaints today. She denies systemic signs of infection. 2/22; patient presents for follow-up. She has no issues or complaints today. She denies signs of infection. AVIYANA, SONNTAG ([id number]) 3/1; patient presents for follow-up. She states that home health came out the day after she was seen in our clinic and yesterday to do the wrap change. She denies signs of infection. She reports excoriated skin on the ankle. 3/8; patient presents for follow-up. She has no issues or complaints today. She denies signs of infection. 3/15; patient presents for follow-up. Home health has been coming out to change the dressings. She reports more tenderness to the wound site. She denies purulent drainage, increased warmth or erythema to the area. 4/5; patient presents for follow-up. She has missed her last 2 clinic appointments. I have not seen her in 3 weeks. She was recently hospitalized for altered mental status. She was involuntarily committed. She was evaluated by psychiatry and deemed to have competency. There was no specific cause of her altered mental status. It was concluded that her physical and mental health were declining due to her chronic medical conditions. Currently home health has been coming out for dressing  changes. Patient has also been doing her own dressing changes. She reports more skin breakdown to the periwound and now has a new wound. She denies fever/chills. She reports continued tenderness to the wound site. 4/12; patient with significant venous insufficiency and a large wound on her left lower leg taking up about 80% of the circumference of her lower leg. Cultures of this grew MRSA and Pseudomonas. She had completed a course of ciprofloxacin now is starting doxycycline. She has been using Dakin's wet-to-dry and a Tubigrip. She has home health twice a week and we change it once. 4/19; patient presents for follow-up. She completed her course of doxycycline. She has been using Dakin's wet-to-dry dressing and Tubigrip. Home health changes the dressing twice weekly. Currently she has no issues or complaints. 4/26; patient presents for follow-up. At last clinic visit orders for home health were Iodosorb under compression therapy. Unfortunately they did not have the dressing and have been using Dakin's and gentamicin under the wrap. Patient currently denies signs of infection. She has no issues or complaints today. 5/3; patient presents for follow-up. Again Iodosorb has not been used under the compression therapy when home health comes out to change the wrap and dressing. They have been using Sorbact. It is unclear why this is happening since we send orders weekly to the agency. She denies signs of infection. Patient has not purchased the [place] antibiotics. We reached out to the company and they said they have been trying to contact her on a regular basis. We gave the patient the number to call to order the medication. 5/10; patient presents for follow-up. She has no issues or complaints today. Again home health has not been using Iodosorb. Mepilex was on the wound bed. No other dressings  noted. She brought in her Keystone antibiotics. She denies signs of infection. 5/17; patient presents for  follow-up. Home health has come out twice since she was last seen. Joint well she has been using Keystone antibiotic with Sorbact under the compression wrap. She has no issues or complaints today. She denies signs of infection. 5/24; patient presents for follow-up. We have been using Keystone antibiotics with Sorbact under compression therapy. She is tolerating the treatment well. She is reporting improvement in wound healing. She denies signs of infection. 5/31; patient presents for follow-up. We continue to do [facility] antibiotics with Sorbact under compression therapy. She continues to report improvement in wound healing. Home health comes out and changes the dressing once weekly. 05-17-2022 upon evaluation today patient appears to be doing better in regard to her wound especially compared to the last time I saw her. Fortunately I do think that she is seeing improvements. With that being said I do believe that she may be benefit from sharp debridement today to clear away some of the necrotic debris I discussed that with her as well. She is an amendable to that plan. Otherwise she is very pleased with how the [employer] is doing for her. 6/14; patient presents for follow-up. We have been using Keystone antibiotic with Sorbact and absorbent dressings under 3 layer compression. She has no issues or complaints today. She reports improvement in wound healing. She denies signs of infection. 6/21; patient presents for follow-up. We are continuing with [facility] antibiotic and Sorbact under 3 layer compression. Patient has no complaints. Continued wound healing is happening. She denies signs of infection. 6/28; patient presents for follow-up. We have been using Keystone antibiotic with Sorbact under 3 layer compression. Usually home health comes out and changes the dressing twice a week. Unfortunately they did not go out to change the dressing. It is unclear why. Patient did not call them. She currently denies  signs of infection. 7/5; patient presents for follow-up. We have been using Keystone antibiotic with calcium alginate under 3 layer compression. She reports improvement in wound healing. She denies signs of infection. Home health has come out to do dressing changes twice this past week. 7/12; patient presents for follow-up. We have been using Keystone antibiotic with calcium alginate under 3 layer compression. Patient states that home health came out once last week to change the dressing. She reports improvement in wound healing. She currently denies signs of infection. 7/19; patient presents for follow-up. We have been using Keystone antibiotic with calcium alginate under 3 layer compression. Home health came out once last week to change the dressing. She has no issues or complaints today. She denies signs of infection. 8/2; patient presents for follow-up. We have been using Keystone antibiotic with calcium alginate under 3 layer compression. Unfortunately she missed her appointment last week and home health did not come out to do dressing changes. Patient currently denies signs of infection. 8/9; patient presents for follow-up. We have been using Keystone with calcium alginate under 3 layer compression. She states that home health came out once last week. She currently denies signs of infection. Her wrap was completely wet. She states she was cleaning the top of the leg and water soaked down into the wrap. 8/16; patient presents for follow-up. We have been using Keystone with calcium alginate under 3 layer compression. She states that home health came out twice last week. She has no issues or complaints today. 8/23; patient presents for follow-up. He has been using [place] with  calcium alginate under 3 layer compression. Home health came out twice last week. She denies signs of infection. 8/30; patient presents for follow-up. We have been using Keystone with calcium alginate under 3 layer  compression. Home health came out once Laura Santiago, Laura Santiago. ([id number]) last week to change the dressing. Patient reports improvement in wound healing. She states she is almost done with her chemotherapy infusions and has 1 more left. 9/13; patient presents for follow-up. She has lost the capsules to her [facility] antibiotic which I believe is the vancomycin pills. She has her Zosyn powder today. We have been using Keystone antibiotic ointment with calcium alginate under 3 layer compression. She is concerned about systemic infection however her vitals are stable and there is no surrounding soft tissue infection. She would like to remain a patient in our wound care center however would like a second opinion for her wound care at another facility. She asked to be referred to [facility] wound care center. Electronic Signature(s) Signed: 08/23/2022 12:08:47 PM By: Laura Shan DO Entered By: Laura Santiago on 08/23/2022 12:05:28 Laura Santiago ([id number]) -------------------------------------------------------------------------------- Physical Exam Details Patient Name: MERTICE, UFFELMAN. Date of Service: 08/23/2022 10:30 AM Medical Record Number: [id number] Patient Account Number: [id number] Date of Birth/Sex: 04-09-1946 (76 y.o. F) Treating RN: Laura Santiago Primary Care Provider: Hilda Santiago, [place] Other Clinician: Massie Santiago Referring Provider: [facility] Treating Provider/Extender: Laura Santiago in Treatment: 69 Constitutional . Cardiovascular . Psychiatric . Notes Left lower extremity: Large open wound with granulation tissue and nonviable tissue. No signs of surrounding soft tissue infection. Electronic Signature(s) Signed: 08/23/2022 12:08:47 PM By: Laura Shan DO Entered By: Laura Santiago on 08/23/2022 12:05:48 Laura Santiago ([id number]) -------------------------------------------------------------------------------- Physician Orders  Details Patient Name: Laura Santiago. Date of Service: 08/23/2022 10:30 AM Medical Record Number: [id number] Patient Account Number: [id number] Date of Birth/Sex: 1946-08-06 (76 y.o. F) Treating RN: Laura Santiago Primary Care Provider: Hilda Santiago, [place] Other Clinician: Massie Santiago Referring Provider: [facility] Treating Provider/Extender: Laura Santiago in Treatment: 58 Verbal / Phone Orders: No Diagnosis Coding Follow-up Appointments o Return Appointment in 1 week. o Nurse Visit as needed [place] for wound care. May utilize formulary equivalent dressing for wound treatment orders unless otherwise specified. Home Health Nurse may visit PRN to address patientos wound care needs. o **Please direct any NON-WOUND related issues/requests for orders to patient's Primary Care Physician. **If current dressing causes regression in wound condition, may D/C ordered dressing product/s and apply Normal Saline Moist Dressing daily until next [place] or Other MD appointment. **Notify Wound Healing Center of regression in wound condition at [phone]. o Other Home Health Orders/Instructions: - Dressing change 3 x weekly, twice by home health and once at wound clinic weekly. PLEASE make sure frequency between dressing changes is appropriate. Bathing/ Shower/ Hygiene o May shower with wound dressing protected with water repellent cover or cast protector. o No tub bath. Anesthetic (Use 'Patient Medications' Section for Anesthetic Order Entry) o Lidocaine applied to wound bed Edema Control - Lymphedema / Segmental Compressive Device / Other o Optional: One layer of unna paste to top of compression wrap (to act as an anchor). - PLEASE when applying wrap start from toes and go up to just below knee o Elevate, Exercise Daily and Avoid Standing for Long Periods of Time. o Elevate legs  to the level of the heart and pump ankles as often as possible   o Elevate leg(s) parallel to the floor when sitting. o DO YOUR BEST to sleep in the bed at night. DO NOT sleep in your recliner. Long hours of sitting in a recliner leads to swelling of the legs and/or potential wounds on your backside. Additional Orders / Instructions o Follow Nutritious Diet and Increase Protein Intake Wound Treatment Wound #1 - Lower Leg Wound Laterality: Left, Medial Cleanser: Wound Cleanser (Home Health) 3 x Per Week/30 Days Discharge Instructions: Wash your hands with soap and water. Remove old dressing, discard into plastic bag and place into trash. Cleanse the wound with Wound Cleanser prior to applying a clean dressing using gauze sponges, not tissues or cotton balls. Do not scrub or use excessive force. Pat dry using gauze sponges, not tissue or cotton balls. Peri-Wound Care: Nystatin Cream USP 30 (g) 3 x Per Week/30 Days Discharge Instructions: Use Nystatin Cream as directed. MIx 1:1 with TCA cream and applied to peri wound Peri-Wound Care: Triamcinolone Acetonide Cream, 0.1%, 15 (g) tube 3 x Per Week/30 Days Discharge Instructions: Apply as directed. mix 1:1 with nystatin cream for peri wound Primary Dressing: Aquacel Extra Hydrofiber Dressing, 4x5 (in/in) 3 x Per Week/30 Days Primary Dressing: keystone compound 3 x Per Week/30 Days Discharge Instructions: apply to wound bed as directed on medication label PLEASE follow direction on pill bottle for proper mixing Secondary Dressing: Zetuvit Absorbent Pad, 4x8 (in/in) 3 x Per Week/30 Days Compression Wrap: 3-LAYER WRAP - Profore Lite LF 3 Multilayer Compression Bandaging System 3 x Per Week/30 Days Discharge Instructions: Apply 3 multi-layer wrap as prescribed. Laura Santiago, Laura Santiago ([id number]) Electronic Signature(s) Signed: 08/23/2022 12:08:47 PM By: Laura Shan DO Entered By: Laura Santiago on 08/23/2022 12:08:16 Laura Santiago, Laura Santiago  ([id number]) -------------------------------------------------------------------------------- Problem List Details Patient Name: SHARIKA, MOSQUERA. Date of Service: 08/23/2022 10:30 AM Medical Record Number: [id number] Patient Account Number: [id number] Date of Birth/Sex: 05-Nov-1946 (76 y.o. F) Treating RN: Laura Santiago Primary Care Provider: Hilda Santiago, [place] Other Clinician: Massie Santiago Referring Provider: [facility] Treating Provider/Extender: Laura Santiago in Treatment: 61 Active Problems ICD-10 Encounter Code Description Active Date MDM Diagnosis L97.822 Non-pressure chronic ulcer of other part of left lower leg with fat layer 11/02/2021 No Yes exposed I87.312 Chronic venous hypertension (idiopathic) with ulcer of left lower 11/02/2021 No Yes extremity I87.2 Venous insufficiency (chronic) (peripheral) 07/06/2021 No Yes Z79.01 Long term (current) use of anticoagulants 07/06/2021 No Yes I10 Essential (primary) hypertension 07/06/2021 No Yes 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 07/06/2021 No Yes Inactive Problems ICD-10 Code Description Active Date Inactive Date S81.802A Unspecified open wound, left lower leg, initial encounter 07/06/2021 07/06/2021 S91.101A Unspecified open wound of right great toe without damage to nail, initial 08/24/2021 08/24/2021 encounter S91.104A Unspecified open wound of right lesser toe(s) without damage to nail, initial 08/24/2021 08/24/2021 encounter Resolved Problems ICD-10 Code Description Active Date Resolved Date S91.104D Unspecified open wound of right lesser toe(s) without damage to nail, 08/31/2021 08/31/2021 subsequent encounter Laura Santiago, Laura Santiago ([id number]) S91.201D Unspecified open wound of right great toe with damage to nail, subsequent 08/31/2021 08/31/2021 encounter Electronic Signature(s) Signed: 08/23/2022 12:08:47 PM By: Laura Shan DO Entered By: Laura Santiago on 08/23/2022 12:03:14 Laura Santiago  ([id number]) -------------------------------------------------------------------------------- Progress Note Details Patient Name: Laura Santiago. Date of Service: 08/23/2022 10:30 AM Medical Record Number: [id number] Patient Account Number: [id number] Date of Birth/Sex: 1946-11-02 (76 y.o. F) Treating RN: Laura Santiago Primary Care Provider: Hilda Santiago, [place] Other Clinician: Massie Santiago Referring Provider: [facility] Treating Provider/Extender:  Laura Santiago Weeks in Treatment: 59 Subjective Chief Complaint Information obtained from Patient Left lower extremity wound Right toe wounds Left upper lateral thigh wounds History of Present Illness (HPI) Admission 7/27 Ms. Richell Corker is a 76 year old female with a past medical history of ADHD, metastatic breast cancer, stage IV chronic kidney disease, history of DVT on Xarelto and chronic venous insufficiency that presents to the clinic for a chronic left lower extremity wound. She recently moved to [facility] 4 days ago. She was being followed by wound care center in [name]. She reports a 10-year history of wounds to her left lower extremity that eventually do heal with debridement and compression therapy. She states that the current wound reopened 4 months ago and she is using Vaseline and Coban. She denies signs of infection. 8/3; patient presents for 1 week follow-up. She reports no issues or complaints today. She states she had vascular studies done in the last week. She denies signs of infection. She brought her little service dog with her today. 8/17; patient presents for follow-up. She has missed her last clinic appointment. She states she took the wrap off and attempted to rewrap her leg. She is having difficulty with transportation. She has her service dog with her today. Overall she feels well and reports improvement in wound healing. She denies signs of infection. She reports owning an old Velcro wrap  compression and has this at her living facility 9/14; patient presents for follow-up. Patient states that over the past 2 to 3 weeks she developed toe wounds to her right foot. She attributes this to tight fitting shoes. She subsequently developed cellulitis in the right leg and has been treated by doxycycline by her oncologist. She reports improvement in symptoms however continues to have some redness and swelling to this leg. To the left lower extremity patient has been having her wraps changed with home health twice weekly. She states that the [facility] is not helping control the drainage. Other than that she has no issues or complaints today. She denies signs of infection to the left lower extremity. 9/21; patient presents for follow-up. She reports seeing infectious disease for her cellulitis. She reports no further management. She has home health that changes the wraps twice weekly. She has no issues or complaints today. She denies signs of infection. 10/5; patient presents for follow-up. She has no issues or complaints today. She denies signs of infection. She states that the right great toe has not been dressed by home health. 10/12; patient presents for follow-up. She has no issues or complaints today. She reports improvement in her wound healing. She has been using silver alginate to the right great toe wound. She denies signs of infection. 10/26; patient presents for follow-up. Home health did not have sorbact so they continued to use Hydrofera Blue under the wrap. She has been using silver alginate to the great toe wound however she did not have a dressing in place today. She currently denies signs of infection. 11/2; patient presents for follow-up. She has been using sorb act under the compression wrap. She reports using silver alginate to the toe wound again she does not have a dressing in place. She currently denies signs of infection. 11/23; patient presents for follow-up.  Unfortunately she has missed her last 2 clinic appointments. She was last seen 3 weeks ago. She did her own compression wrap with Kerlix and Coban yesterday after seeing vein and vascular. She has not been dressing her right great toe  wound. She currently denies signs of infection. 11/30; patient presents for 1 week follow-up. She states she changed her dressing last week prior to home health and use sorb act with Dakin's and Hydrofera Blue. Home health has changed the dressing as well and they have been using sorbact. Today she reports increased redness to her right lower extremity. She has a history of cellulitis to this leg. She has been using silver alginate to the right great toe. Unfortunately she had an episode of diarrhea prior to coming in and had feces all over the right leg and to the wrap of her left leg. 12/7; patient presents for 1 week follow-up. She states that home health did not come out to change the dressing and she took it off yesterday. It is unclear if she is dressing the right toe wound. She denies signs of infection. 12/14; patient presents for 1 week follow-up. She has no issues or complaints today. 12/21; patient presents for follow-up. She has no issues or complaints today. She denies signs of infection. 12/28/2021; patient presents for follow-up. She was hospitalized for sepsis secondary to right lower extremity cellulitis On 12/23. She states she is currently at a SNF. She states that she was started on doxycycline this morning for her right great toe swelling and redness. She is not sure what dressings have been done to her left lower extremity for the past 3 weeks. She says its been mainly gauze with an Ace wrap. 1/25; patient presents for follow-up. She is still residing in a skilled nursing facility. She reports mild pain to the left lower extremity wound bed. She states she is going to see a podiatrist soon. 2/8; patient presents for follow-up. She has moved back to  her residential community from her skilled nursing facility. She has no issues or complaints today. She denies signs of systemic infections. Laura Santiago, Laura Santiago ([id number]) 2/15; patient presents for follow-up. He has no issues or complaints today. She denies systemic signs of infection. 2/22; patient presents for follow-up. She has no issues or complaints today. She denies signs of infection. 3/1; patient presents for follow-up. She states that home health came out the day after she was seen in our clinic and yesterday to do the wrap change. She denies signs of infection. She reports excoriated skin on the ankle. 3/8; patient presents for follow-up. She has no issues or complaints today. She denies signs of infection. 3/15; patient presents for follow-up. Home health has been coming out to change the dressings. She reports more tenderness to the wound site. She denies purulent drainage, increased warmth or erythema to the area. 4/5; patient presents for follow-up. She has missed her last 2 clinic appointments. I have not seen her in 3 weeks. She was recently hospitalized for altered mental status. She was involuntarily committed. She was evaluated by psychiatry and deemed to have competency. There was no specific cause of her altered mental status. It was concluded that her physical and mental health were declining due to her chronic medical conditions. Currently home health has been coming out for dressing changes. Patient has also been doing her own dressing changes. She reports more skin breakdown to the periwound and now has a new wound. She denies fever/chills. She reports continued tenderness to the wound site. 4/12; patient with significant venous insufficiency and a large wound on her left lower leg taking up about 80% of the circumference of her lower leg. Cultures of this grew MRSA and Pseudomonas. She had completed a  course of ciprofloxacin now is starting doxycycline. She has been  using Dakin's wet-to-dry and a Tubigrip. She has home health twice a week and we change it once. 4/19; patient presents for follow-up. She completed her course of doxycycline. She has been using Dakin's wet-to-dry dressing and Tubigrip. Home health changes the dressing twice weekly. Currently she has no issues or complaints. 4/26; patient presents for follow-up. At last clinic visit orders for home health were Iodosorb under compression therapy. Unfortunately they did not have the dressing and have been using Dakin's and gentamicin under the wrap. Patient currently denies signs of infection. She has no issues or complaints today. 5/3; patient presents for follow-up. Again Iodosorb has not been used under the compression therapy when home health comes out to change the wrap and dressing. They have been using Sorbact. It is unclear why this is happening since we send orders weekly to the agency. She denies signs of infection. Patient has not purchased the [place] antibiotics. We reached out to the company and they said they have been trying to contact her on a regular basis. We gave the patient the number to call to order the medication. 5/10; patient presents for follow-up. She has no issues or complaints today. Again home health has not been using Iodosorb. Mepilex was on the wound bed. No other dressings noted. She brought in her Keystone antibiotics. She denies signs of infection. 5/17; patient presents for follow-up. Home health has come out twice since she was last seen. Joint well she has been using Keystone antibiotic with Sorbact under the compression wrap. She has no issues or complaints today. She denies signs of infection. 5/24; patient presents for follow-up. We have been using Keystone antibiotics with Sorbact under compression therapy. She is tolerating the treatment well. She is reporting improvement in wound healing. She denies signs of infection. 5/31; patient presents for  follow-up. We continue to do [facility] antibiotics with Sorbact under compression therapy. She continues to report improvement in wound healing. Home health comes out and changes the dressing once weekly. 05-17-2022 upon evaluation today patient appears to be doing better in regard to her wound especially compared to the last time I saw her. Fortunately I do think that she is seeing improvements. With that being said I do believe that she may be benefit from sharp debridement today to clear away some of the necrotic debris I discussed that with her as well. She is an amendable to that plan. Otherwise she is very pleased with how the [employer] is doing for her. 6/14; patient presents for follow-up. We have been using Keystone antibiotic with Sorbact and absorbent dressings under 3 layer compression. She has no issues or complaints today. She reports improvement in wound healing. She denies signs of infection. 6/21; patient presents for follow-up. We are continuing with [facility] antibiotic and Sorbact under 3 layer compression. Patient has no complaints. Continued wound healing is happening. She denies signs of infection. 6/28; patient presents for follow-up. We have been using Keystone antibiotic with Sorbact under 3 layer compression. Usually home health comes out and changes the dressing twice a week. Unfortunately they did not go out to change the dressing. It is unclear why. Patient did not call them. She currently denies signs of infection. 7/5; patient presents for follow-up. We have been using Keystone antibiotic with calcium alginate under 3 layer compression. She reports improvement in wound healing. She denies signs of infection. Home health has come out to do dressing changes twice this  past week. 7/12; patient presents for follow-up. We have been using Keystone antibiotic with calcium alginate under 3 layer compression. Patient states that home health came out once last week to change the  dressing. She reports improvement in wound healing. She currently denies signs of infection. 7/19; patient presents for follow-up. We have been using Keystone antibiotic with calcium alginate under 3 layer compression. Home health came out once last week to change the dressing. She has no issues or complaints today. She denies signs of infection. 8/2; patient presents for follow-up. We have been using Keystone antibiotic with calcium alginate under 3 layer compression. Unfortunately she missed her appointment last week and home health did not come out to do dressing changes. Patient currently denies signs of infection. 8/9; patient presents for follow-up. We have been using Keystone with calcium alginate under 3 layer compression. She states that home health came out once last week. She currently denies signs of infection. Her wrap was completely wet. She states she was cleaning the top of the leg and water soaked down into the wrap. 8/16; patient presents for follow-up. We have been using Keystone with calcium alginate under 3 layer compression. She states that home health came out twice last week. She has no issues or complaints today. Laura Santiago, Laura Santiago ([id number]) 8/23; patient presents for follow-up. He has been using Keystone with calcium alginate under 3 layer compression. Home health came out twice last week. She denies signs of infection. 8/30; patient presents for follow-up. We have been using Keystone with calcium alginate under 3 layer compression. Home health came out once last week to change the dressing. Patient reports improvement in wound healing. She states she is almost done with her chemotherapy infusions and has 1 more left. 9/13; patient presents for follow-up. She has lost the capsules to her [facility] antibiotic which I believe is the vancomycin pills. She has her Zosyn powder today. We have been using Keystone antibiotic ointment with calcium alginate under 3 layer  compression. She is concerned about systemic infection however her vitals are stable and there is no surrounding soft tissue infection. She would like to remain a patient in our wound care center however would like a second opinion for her wound care at another facility. She asked to be referred to [facility] wound care center. Objective Constitutional Vitals Time Taken: 11:05 AM, Height: 66 in, Weight: 153 lbs, BMI: 24.7, Temperature: 98.0 F, Pulse: 73 bpm, Respiratory Rate: 18 breaths/min, Blood Pressure: 146/71 mmHg. General Notes: Left lower extremity: Large open wound with granulation tissue and nonviable tissue. No signs of surrounding soft tissue infection. Integumentary (Hair, Skin) Wound #1 status is Open. Original cause of wound was Gradually Appeared. The date acquired was: 04/06/2021. The wound has been in treatment 59 weeks. The wound is located on the Left,Medial Lower Leg. The wound measures 8.7cm length x 11.8cm width x 0.2cm depth; 80.629cm^2 area and 16.126cm^3 volume. There is Fat Layer (Subcutaneous Tissue) exposed. There is a medium amount of serosanguineous drainage noted. The wound margin is flat and intact. There is medium (34-66%) red, pink granulation within the wound bed. There is a medium (34-66%) amount of necrotic tissue within the wound bed including Adherent Slough. 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 She is doing well with the [facility] antibiotic ointment and I recommended continuing this with calcium alginate under 3 layer compression. No signs of surrounding soft tissue  infection. She would like a second opinion for her wound care. She requested [facility] wound care center. We will send a referral there. We will keep providing her care. 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 There was a Excisional Skin/Subcutaneous Tissue Debridement with a total area of 102.66 sq cm performed by Laura Shan, MD. With the following instrument(s): Curette to remove Viable and Non-Viable tissue/material. Material removed includes Subcutaneous Tissue and Slough and. A time out was conducted at 11:30, prior to the start of the procedure. A Minimum amount of bleeding was controlled with Pressure. The procedure was tolerated well. Post Debridement Measurements: 8.7cm length x 11.8cm width x 0.2cm depth; 16.126cm^3 volume. Character of Wound/Ulcer Post Debridement is stable. Severity of Tissue Post Debridement is: Fat layer exposed. Post procedure Diagnosis Wound #1: Same as Pre-Procedure Laura Santiago, Laura Santiago. ([id number]) Pre-procedure diagnosis of Wound #1 is a Venous Leg Ulcer located on the Left,Medial Lower Leg . There was a Three Layer Compression Therapy Procedure with a pre-treatment ABI of 1.5 by Laura Santiago. Post procedure Diagnosis Wound #1: Same as Pre-Procedure Plan Follow-up Appointments: Return Appointment in 1 week. Nurse Visit as needed Home Health: [place]: - [place] for wound care. May utilize formulary equivalent dressing for wound treatment orders unless otherwise specified. Home Health Nurse may visit PRN to address patient s wound care needs. **Please direct any NON-WOUND related issues/requests for orders to patient's Primary Care Physician. **If current dressing causes regression in wound condition, may D/C ordered dressing product/s and apply Normal Saline Moist Dressing daily until next  [place] or Other MD appointment. **Notify Wound Healing Center of regression in wound condition at [phone]. Other Home Health Orders/Instructions: - Dressing change 3 x weekly, twice by home health and once at wound clinic weekly. PLEASE make sure frequency between dressing changes is appropriate. Bathing/ Shower/ Hygiene: May shower with wound dressing protected with water repellent cover or cast protector. No tub bath. Anesthetic (Use 'Patient Medications' Section for Anesthetic Order Entry): Lidocaine applied to wound bed Edema Control - Lymphedema / Segmental Compressive Device / Other: Optional: One layer of unna paste to top of compression wrap (to act as an anchor). - PLEASE when applying wrap start from toes and go up to just below knee Elevate, Exercise Daily and Avoid Standing for Long Periods of Time. Elevate legs to the level of the heart and pump ankles as often as possible Elevate leg(s) parallel to the floor when sitting. DO YOUR BEST to sleep in the bed at night. DO NOT sleep in your recliner. Long hours of sitting in a recliner leads to swelling of the legs and/or potential wounds on your backside. Additional Orders / Instructions: Follow Nutritious Diet and Increase Protein Intake WOUND #1: - Lower Leg Wound Laterality: Left, Medial Cleanser: Wound Cleanser (Home Health) 3 x Per Week/30 Days Discharge Instructions: Wash your hands with soap and water. Remove old dressing, discard into plastic bag and place into trash. Cleanse the wound with Wound Cleanser prior to applying a clean dressing using gauze sponges, not tissues or cotton balls. Do not scrub or use excessive force. Pat dry using  gauze sponges, not tissue or cotton balls. Peri-Wound Care: Nystatin Cream USP 30 (g) 3 x Per Week/30 Days Discharge Instructions: Use Nystatin Cream as directed. MIx 1:1 with TCA cream and applied to peri wound Peri-Wound Care: Triamcinolone Acetonide Cream, 0.1%, 15  (g) tube 3 x Per Week/30 Days Discharge Instructions: Apply as directed. mix 1:1 with nystatin cream for peri wound Primary Dressing: Aquacel Extra Hydrofiber Dressing, 4x5 (in/in) 3 x Per Week/30 Days Primary Dressing: keystone compound 3 x Per Week/30 Days Discharge Instructions: apply to wound bed as directed on medication label PLEASE follow direction on pill bottle for proper mixing Secondary Dressing: Zetuvit Absorbent Pad, 4x8 (in/in) 3 x Per Week/30 Days Compression Wrap: 3-LAYER WRAP - Profore Lite LF 3 Multilayer Compression Bandaging System 3 x Per Week/30 Days Discharge Instructions: Apply 3 multi-layer wrap as prescribed. 1. In office sharp debridement 2. Keystone antibiotic with calcium alginate under 3 layer compression 3. Follow-up in 1 week 4. Referral to [facility] wound care center Electronic Signature(s) Signed: 08/23/2022 12:08:47 PM By: Laura Shan DO Entered By: Laura Santiago on 08/23/2022 12:07:55 Laura Santiago ([id number]) -------------------------------------------------------------------------------- [place] Details Patient Name: Laura Santiago. Date of Service: 08/23/2022 Medical Record Number: [id number] Patient Account Number: [id number] Date of Birth/Sex: 09/10/1946 (76 y.o. F) Treating RN: Laura Santiago Primary Care Provider: Hilda Santiago, [place] Other Clinician: Massie Santiago Referring Provider: [facility] Treating Provider/Extender: Laura Santiago in Treatment: 59 Diagnosis Coding ICD-10 Codes Code Description [phone] 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 I87.2 Venous insufficiency (chronic) (peripheral) Z79.01 Long term (current) use of anticoagulants I10 Essential (primary) hypertension 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 Facility Procedures CPT4 Code: 56812751 Description: 70017 -  DEB SUBQ TISSUE 20 SQ CM/< Modifier: Quantity: 1 CPT4 Code: Description: ICD-10 Diagnosis Description L97.822 Non-pressure chronic ulcer of other part of left lower leg with fat layer Modifier: exposed Quantity: CPT4 Code: [id number] Description: 91638 - DEB SUBQ TISS EA ADDL 20CM Modifier: Quantity: 5 CPT4 Code: Description: ICD-10 Diagnosis Description L97.822 Non-pressure chronic ulcer of other part of left lower leg with fat layer Modifier: exposed Quantity: Physician Procedures CPT4 Code: [id number] Description: 11042 - WC PHYS SUBQ TISS 20 SQ CM Modifier: Quantity: 1 CPT4 Code: Description: ICD-10 Diagnosis Description L97.822 Non-pressure chronic ulcer of other part of left lower leg with fat layer Modifier: exposed Quantity: CPT4 Code: [id number] Description: 11045 - WC PHYS SUBQ TISS EA ADDL 20 CM Modifier: Quantity: 5 CPT4 Code: Description: ICD-10 Diagnosis Description L97.822 Non-pressure chronic ulcer of other part of left lower leg with fat layer Modifier: exposed Quantity: Electronic Signature(s) Signed: 08/23/2022 12:08:47 PM By: Laura Shan DO Entered By: Laura Santiago on 08/23/2022 12:08:08

## 2022-08-29 ENCOUNTER — Other Ambulatory Visit: Payer: Self-pay | Admitting: *Deleted

## 2022-08-29 ENCOUNTER — Inpatient Hospital Stay: Payer: Medicare Other

## 2022-08-29 ENCOUNTER — Inpatient Hospital Stay: Payer: Medicare Other | Attending: Oncology

## 2022-08-29 ENCOUNTER — Encounter: Payer: Self-pay | Admitting: Oncology

## 2022-08-29 ENCOUNTER — Inpatient Hospital Stay (HOSPITAL_BASED_OUTPATIENT_CLINIC_OR_DEPARTMENT_OTHER): Payer: Medicare Other | Admitting: Oncology

## 2022-08-29 VITALS — BP 116/66 | HR 70 | Temp 98.0°F | Resp 17

## 2022-08-29 VITALS — BP 130/74 | HR 68 | Temp 98.2°F | Resp 16 | Ht 65.0 in | Wt 141.5 lb

## 2022-08-29 DIAGNOSIS — C7951 Secondary malignant neoplasm of bone: Secondary | ICD-10-CM | POA: Diagnosis not present

## 2022-08-29 DIAGNOSIS — C50911 Malignant neoplasm of unspecified site of right female breast: Secondary | ICD-10-CM | POA: Diagnosis not present

## 2022-08-29 DIAGNOSIS — C50919 Malignant neoplasm of unspecified site of unspecified female breast: Secondary | ICD-10-CM

## 2022-08-29 DIAGNOSIS — Z5112 Encounter for antineoplastic immunotherapy: Secondary | ICD-10-CM | POA: Insufficient documentation

## 2022-08-29 DIAGNOSIS — I129 Hypertensive chronic kidney disease with stage 1 through stage 4 chronic kidney disease, or unspecified chronic kidney disease: Secondary | ICD-10-CM | POA: Insufficient documentation

## 2022-08-29 DIAGNOSIS — C787 Secondary malignant neoplasm of liver and intrahepatic bile duct: Secondary | ICD-10-CM | POA: Diagnosis not present

## 2022-08-29 DIAGNOSIS — R7989 Other specified abnormal findings of blood chemistry: Secondary | ICD-10-CM

## 2022-08-29 DIAGNOSIS — Z800 Family history of malignant neoplasm of digestive organs: Secondary | ICD-10-CM | POA: Diagnosis not present

## 2022-08-29 DIAGNOSIS — Z8042 Family history of malignant neoplasm of prostate: Secondary | ICD-10-CM | POA: Insufficient documentation

## 2022-08-29 DIAGNOSIS — Z170 Estrogen receptor positive status [ER+]: Secondary | ICD-10-CM | POA: Insufficient documentation

## 2022-08-29 DIAGNOSIS — C779 Secondary and unspecified malignant neoplasm of lymph node, unspecified: Secondary | ICD-10-CM | POA: Diagnosis not present

## 2022-08-29 DIAGNOSIS — N184 Chronic kidney disease, stage 4 (severe): Secondary | ICD-10-CM | POA: Diagnosis not present

## 2022-08-29 DIAGNOSIS — L03115 Cellulitis of right lower limb: Secondary | ICD-10-CM

## 2022-08-29 DIAGNOSIS — Z7189 Other specified counseling: Secondary | ICD-10-CM | POA: Diagnosis not present

## 2022-08-29 DIAGNOSIS — Z808 Family history of malignant neoplasm of other organs or systems: Secondary | ICD-10-CM | POA: Diagnosis not present

## 2022-08-29 DIAGNOSIS — Z5111 Encounter for antineoplastic chemotherapy: Secondary | ICD-10-CM | POA: Diagnosis present

## 2022-08-29 LAB — COMPREHENSIVE METABOLIC PANEL
ALT: 12 U/L (ref 0–44)
AST: 21 U/L (ref 15–41)
Albumin: 3.8 g/dL (ref 3.5–5.0)
Alkaline Phosphatase: 76 U/L (ref 38–126)
Anion gap: 5 (ref 5–15)
BUN: 33 mg/dL — ABNORMAL HIGH (ref 8–23)
CO2: 26 mmol/L (ref 22–32)
Calcium: 8.5 mg/dL — ABNORMAL LOW (ref 8.9–10.3)
Chloride: 106 mmol/L (ref 98–111)
Creatinine, Ser: 1.67 mg/dL — ABNORMAL HIGH (ref 0.44–1.00)
GFR, Estimated: 31 mL/min — ABNORMAL LOW (ref >60)
Glucose, Bld: 120 mg/dL — ABNORMAL HIGH (ref 70–99)
Potassium: 4.1 mmol/L (ref 3.5–5.1)
Sodium: 137 mmol/L (ref 135–145)
Total Bilirubin: 0.5 mg/dL (ref 0.3–1.2)
Total Protein: 8 g/dL (ref 6.5–8.1)

## 2022-08-29 LAB — CBC WITH DIFFERENTIAL/PLATELET
Abs Immature Granulocytes: 0.03 10*3/uL (ref 0.00–0.07)
Basophils Absolute: 0 10*3/uL (ref 0.0–0.1)
Basophils Relative: 1 %
Eosinophils Absolute: 0.2 10*3/uL (ref 0.0–0.5)
Eosinophils Relative: 3 %
HCT: 33.4 % — ABNORMAL LOW (ref 36.0–46.0)
Hemoglobin: 10.3 g/dL — ABNORMAL LOW (ref 12.0–15.0)
Immature Granulocytes: 0 %
Lymphocytes Relative: 13 %
Lymphs Abs: 0.8 10*3/uL (ref 0.7–4.0)
MCH: 26.1 pg (ref 26.0–34.0)
MCHC: 30.8 g/dL (ref 30.0–36.0)
MCV: 84.8 fL (ref 80.0–100.0)
Monocytes Absolute: 0.5 10*3/uL (ref 0.1–1.0)
Monocytes Relative: 7 %
Neutro Abs: 5.2 10*3/uL (ref 1.7–7.7)
Neutrophils Relative %: 76 %
Platelets: 264 10*3/uL (ref 150–400)
RBC: 3.94 MIL/uL (ref 3.87–5.11)
RDW: 15.9 % — ABNORMAL HIGH (ref 11.5–15.5)
WBC: 6.7 10*3/uL (ref 4.0–10.5)
nRBC: 0 % (ref 0.0–0.2)

## 2022-08-29 MED ORDER — SODIUM CHLORIDE 0.9 % IV SOLN
420.0000 mg | Freq: Once | INTRAVENOUS | Status: AC
  Administered 2022-08-29: 420 mg via INTRAVENOUS
  Filled 2022-08-29: qty 14

## 2022-08-29 MED ORDER — AMPHETAMINE-DEXTROAMPHET ER 30 MG PO CP24: 30.0000 mg | ORAL_CAPSULE | Freq: Every day | ORAL | 0 refills | Status: DC

## 2022-08-29 MED ORDER — DOXYCYCLINE HYCLATE 100 MG PO TABS: 100.0000 mg | ORAL_TABLET | Freq: Two times a day (BID) | ORAL | 0 refills | Status: DC

## 2022-08-29 MED ORDER — HEPARIN SOD (PORK) LOCK FLUSH 100 UNIT/ML IV SOLN
INTRAVENOUS | Status: AC
  Administered 2022-08-29: 500 [IU]
  Filled 2022-08-29: qty 5

## 2022-08-29 MED ORDER — TRASTUZUMAB-DKST CHEMO 150 MG IV SOLR
420.0000 mg | Freq: Once | INTRAVENOUS | Status: AC
  Administered 2022-08-29: 420 mg via INTRAVENOUS
  Filled 2022-08-29: qty 20

## 2022-08-29 MED ORDER — SODIUM CHLORIDE 0.9 % IV SOLN
Freq: Once | INTRAVENOUS | Status: AC
  Filled 2022-08-29: qty 250

## 2022-08-29 MED ORDER — HEPARIN SOD (PORK) LOCK FLUSH 100 UNIT/ML IV SOLN
500.0000 [IU] | Freq: Once | INTRAVENOUS | Status: AC | PRN
  Filled 2022-08-29: qty 5

## 2022-08-29 MED ORDER — FENTANYL 25 MCG/HR TD PT72: MEDICATED_PATCH | TRANSDERMAL | 0 refills | Status: DC

## 2022-08-29 MED ORDER — ACETAMINOPHEN 325 MG PO TABS
650.0000 mg | ORAL_TABLET | Freq: Once | ORAL | Status: AC
  Administered 2022-08-29: 650 mg via ORAL
  Filled 2022-08-29: qty 2

## 2022-08-29 MED ORDER — DIPHENHYDRAMINE HCL 25 MG PO CAPS
50.0000 mg | ORAL_CAPSULE | Freq: Once | ORAL | Status: AC
  Administered 2022-08-29: 50 mg via ORAL
  Filled 2022-08-29: qty 2

## 2022-08-29 NOTE — Patient Instructions (Signed)
[facility] CANCER CTR AT [place]  Discharge Instructions: Thank you for choosing [facility] to provide your oncology and hematology care.  If you have a lab appointment with the [facility], please go directly to the [facility] and check in at the registration area.  Wear comfortable clothing and clothing appropriate for easy access to any Portacath or PICC line.   We strive to give you quality time with your provider. You may need to reschedule your appointment if you arrive late (15 or more minutes).  Arriving late affects you and other patients whose appointments are after yours.  Also, if you miss three or more appointments without notifying the office, you may be dismissed from the clinic at the provider's discretion.      For prescription refill requests, have your pharmacy contact our office and allow 72 hours for refills to be completed.    Today you received the following chemotherapy and/or immunotherapy agents Ogivri and Perjeta       To help prevent nausea and vomiting after your treatment, we encourage you to take your nausea medication as directed.  BELOW ARE SYMPTOMS THAT SHOULD BE REPORTED IMMEDIATELY: *FEVER GREATER THAN 100.4 F (38 C) OR HIGHER *CHILLS OR SWEATING *NAUSEA AND VOMITING THAT IS NOT CONTROLLED WITH YOUR NAUSEA MEDICATION *UNUSUAL SHORTNESS OF BREATH *UNUSUAL BRUISING OR BLEEDING *URINARY PROBLEMS (pain or burning when urinating, or frequent urination) *BOWEL PROBLEMS (unusual diarrhea, constipation, pain near the anus) TENDERNESS IN MOUTH AND THROAT WITH OR WITHOUT PRESENCE OF ULCERS (sore throat, sores in mouth, or a toothache) UNUSUAL RASH, SWELLING OR PAIN  UNUSUAL VAGINAL DISCHARGE OR ITCHING   Items with * indicate a potential emergency and should be followed up as soon as possible or go to the Emergency Department if any problems should occur.  Please show the CHEMOTHERAPY ALERT CARD or IMMUNOTHERAPY ALERT CARD at  check-in to the Emergency Department and triage nurse.  Should you have questions after your visit or need to cancel or reschedule your appointment, please contact [facility] CANCER [place] AT [place]  [phone] and follow the prompts.  Office hours are 8:00 a.m. to 4:30 p.m. Monday - Friday. Please note that voicemails left after 4:00 p.m. may not be returned until the following business day.  We are closed weekends and major holidays. You have access to a nurse at all times for urgent questions. Please call the main number to the clinic [phone] and follow the prompts.  For any non-urgent questions, you may also contact your provider using MyChart. We now offer e-Visits for anyone 75 and older to request care online for non-urgent symptoms. For details visit [email or web address].   Also download the MyChart app! Go to the app store, search "MyChart", open the app, select [facility], and log in with your MyChart username and password.  Masks are optional in the cancer centers. If you would like for your care team to wear a mask while they are taking care of you, please let them know. For doctor visits, patients may have with them one support person who is at least 78 years old. At this time, visitors are not allowed in the infusion area.

## 2022-08-29 NOTE — Progress Notes (Signed)
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  She had a screening mammogram in September 2014 which showed 2.1 x 2.3 x 1.8 cm irregular mass in her right breast.  It was ER 10% positive PR negative and HER2 positive +3.  She received neoadjuvant chemotherapy with Taxol Herceptin and Perjeta for 4 cycles followed by dose dense AC/Herceptin x4 which she completed in March 2015.  She had a right lumpectomy on 04/03/2014 which showed scant residual invasive ductal carcinoma [id number].  She completed 1 year of adjuvant Herceptin chemotherapy and also completed adjuvant radiation treatment.  She was recommended anastrozole which she took on and off starting November 2015 and stopped sometime in 2020.   She was then hospitalized with neck pain and was found to have lytic lesions involving C5-C6 with pathological vertebral fractures.  She underwent radiation treatment to this area.  Image guided biopsy of the L1 vertebral body showed metastatic carcinoma consistent with breast origin ER 10% PR 0% and HER2 amplified ratio 5.1 average HER2 signal number per cell 15.0 average CEP 17 signals number per cell 3.0.  Baseline echocardiogram on 05/02/2021 showed a normal EF of 62% she was recommended  Taxol Herceptin and Perjeta which she received for 2 cycles at [facility] until June 10, 2021   She has chronic pain from her bone metastases for which she is currently on oxycodone 10 mg every 4 hours as needed and 12 mcg fentanyl patch.  She was seeing pain clinic when she was living in [name].   Patient has also been on Zometa when she was in [name] but she does have some ongoing dental issues.  She has received Xgeva in the past as well.  Her last Delton See was in July 2021.  Last PET scan was on 03/21/2021 which showed diffuse osseous metastatic disease involving the head neck chest abdomen and pelvis and spine.  Left lung apex hypermetabolic nodule and multiple hypermetabolic liver lesions concerning for disease involvement.  Enlarged hypermetabolic left inguinal lymph nodes along with hypermetabolic external iliac and left supraclavicular lymph nodes   Patient is now moved to [place] to be close to her daughter.  She lives in an independent living.  Patient received Taxol Herceptin and Perjeta for about a year and is presently on maintenance Herceptin and Perjeta  Interval history-patient reports redness and swelling of her right lower extremity.  She has had cellulitis in that leg before.  ECOG PS- 2 Pain scale- 3 Opioid associated constipation- no  Review of systems- Review of Systems  Constitutional:  Positive for malaise/fatigue.  Musculoskeletal:        Right lower extremity redness and swelling      Allergies  Allergen Reactions   Other Diarrhea and Nausea And Vomiting    Pt has had episodes of nausea, vomiting and diarrhea right after receiving IV  technetium for NM study on two occasions.   Technetium Tc 57mMedronate Diarrhea and Nausea And Vomiting    Pt has had episodes of nausea, vomiting and diarrhea right after receiving IV technetium for NM study on two occasions.    Corticosteroids Other (See Comments)    [place]. Notes that it  causes agitation intolerance   Sulfa Antibiotics Other (See Comments)    [place] has allergy but we do not know reactions when taking the drug   Celebrex [Celecoxib] Rash     Past Medical History:  Diagnosis Date   ADHD (attention deficit hyperactivity disorder)    [place]   Anemia    IDA 11/26/2019, Anemia in stage 4 chronic kidney disease ([place] 09/03/2020   Arthritis    osteoarthritis right knee 09/30/2014   Breast cancer ([place] 10[place]05/[place]   [place] 2 [place] 3+   Cancer related pain 03/28/2021   [place]   cervical compression fracture 03/18/2021   [place]   [place] of [place] extremity, bilateral ([place] 03/30/2014   [place]  Generalized muscle weakness 03/31/2016   [place]  GERD (gastroesophageal reflux disease) 05/15/2013   [place]  Hyperparathyroidism, secondary ([place] 04/25/2018   [place]  Hypertension 02/20/2016   [place]  Metabolic acidosis 002/58/5277  [place]  Metabolic syndrome 082/42/3536  [place]  Osteopenia after menopause 03/29/2016   [place]  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   [place]  Stasis ulcer of left lower extremity ([place] 03/29[place]   [place]    Past Surgical History:  Procedure Laterality Date   CESAREAN SECTION     unknown   fibroid removed  N/A    [place]   IR FLUORO GUIDE CV LINE LEFT  07/27/2021   IR PORT REPAIR CENTRAL VENOUS ACCESS DEVICE Left    [place]      unknown   PORTA CATH INSERTION N/A 02/13/2022   Procedure: PORTA CATH INSERTION;  Surgeon: DAlgernon Huxley MD;  Location: [facility];  Service: Cardiovascular;  Laterality: N/A;   [place]ARACT EXTRACAP,INSERT LENS Bilateral  Bilateral 09/05/2012   [place]    [place]    [place]UM[place]OMY Right 04[place]03/2014   [place]   Social History    Socioeconomic History   Marital status: Divorced    Spouse name: Not on file   Number of children: Not on file   Years of education: Not on file   Highest education level: Not on file  Occupational History   Occupation: retired oTeacher, music   [place]   Smoking status: Never   Smokeless tobacco: Never  Vaping Use   Vaping Use: Never used  Substance and Sexual Activity   Alcohol use: Not Currently   Drug use: Not Currently   Sexual activity: Not Currently  Other Topics Concern   Not on file  Social History Narrative   Not on file   Social Determinants of Health   Financial Resource  Strain: Low Risk  (03/17/2022)   Overall Financial Resource Strain (CARDIA)    Difficulty of Paying Living Expenses: Not hard at all  Food Insecurity: No Food Insecurity (03/17/2022)   Hunger Vital Sign    Worried About Running Out of Food in the Last Year: Never true    Ran Out of Food in the Last Year: Never true  Transportation Needs: No Transportation Needs (03/17/2022)   PRAPARE - Hydrologist (Medical): No    Lack of Transportation (Non-Medical): No  Physical Activity: Inactive (03/17/2022)   Exercise Vital Sign    Days of Exercise per Week: 0 days    Minutes of Exercise per Session: 0 min  Stress: No Stress Concern Present (03/17/2022)   [place]    Feeling of Stress : Only a little  Social Connections: Socially Isolated (03/17/2022)   Social Connection and Isolation Panel [NHANES]    Frequency of Communication with Friends and Family: Twice a week    Frequency of Social Gatherings with Friends and Family: Twice a week    Attends Religious Services: Never    Marine scientist or Organizations: No    Attends Archivist Meetings: Never    Marital Status: Widowed  Intimate Partner Violence: Not At Risk (03/17/2022)   Humiliation, Afraid, Rape, and  Kick questionnaire    Fear of Current or Ex-Partner: No    Emotionally Abused: No    Physically Abused: No    Sexually Abused: No    Family History  Problem Relation Age of Onset   Pancreatic cancer Mother    Stroke Father    Diabetes Father    Hypertension Father    Heart disease Father    Skin cancer Father    Varicose Veins Father    Skin cancer Brother    Cancer - Prostate Brother      Current Outpatient Medications:    acetaminophen (TYLENOL) 500 MG tablet, Take 500 mg by mouth every 4 (four) hours as needed., Disp: , Rfl:    calcium citrate (CALCITRATE - DOSED IN MG ELEMENTAL CALCIUM) 950 (200 Ca) MG tablet, Take 200 mg of elemental calcium by mouth daily., Disp: , Rfl:    gabapentin (NEURONTIN) 100 MG capsule, Take 1 capsule (100 mg total) by mouth 3 (three) times daily., Disp: 90 capsule, Rfl: 1   lisinopril (ZESTRIL) 20 MG tablet, Take 1 tablet (20 mg total) by mouth daily., Disp: 30 tablet, Rfl: 3   LORazepam (ATIVAN) 0.5 MG tablet, Take 1 tablet (0.5 mg total) by mouth daily as needed for anxiety., Disp: 30 tablet, Rfl: 0   Multiple Vitamin (MULTI-VITAMIN) tablet, Take 1 tablet by mouth daily., Disp: , Rfl:    NON FORMULARY, Apply 1 Dose topically daily. Pt mixes up piperacillim and tazobactam and puts it on the wounds of left leg, Disp: , Rfl:    Oxycodone HCl 10 MG TABS, Take 1.5 tablets (15 mg total) by mouth every 6 (six) hours as needed., Disp: 90 tablet, Rfl: 0   rivaroxaban (XARELTO) 20 MG TABS tablet, Take 1 tablet (20 mg total) by mouth daily with supper., Disp: 30 tablet, Rfl: 3   sodium hypochlorite (DAKIN'S 1/4 STRENGTH) 0.125 % SOLN, Apply 1 application topically as directed. Moisten gauze with solution and wrap wound, Disp: , Rfl:    amphetamine-dextroamphetamine (ADDERALL XR) 30 MG 24 hr capsule, Take 1 capsule (30 mg total) by mouth daily., Disp:  30 capsule, Rfl: 0   doxycycline (VIBRA-TABS) 100 MG tablet, Take 1 tablet (100 mg total) by mouth 2 (two) times  daily., Disp: 14 tablet, Rfl: 0   DULoxetine (CYMBALTA) 30 MG capsule, Take by mouth. (Patient not taking: Reported on 06/06/2022), Disp: , Rfl:    fentaNYL (DURAGESIC) 25 MCG/HR, PLACE 1 PATCH ONTO THE SKIN EVERY THREE DAYS AS DIRECTED., Disp: 10 patch, Rfl: 0   gentamicin cream (GARAMYCIN) 0.1 %, SMARTSIG:1 Application Topical (Patient not taking: Reported on 03/14/2022), Disp: , Rfl:    hydrOXYzine (ATARAX) 25 MG tablet, Take by mouth. (Patient not taking: Reported on 08/15/2022), Disp: , Rfl:    naloxone (NARCAN) nasal spray 4 mg/0.1 mL, SPRAY 1 SPRAY INTO ONE NOSTRIL AS DIRECTED FOR OPIOID OVERDOSE (TURN PERSON ON SIDE AFTER DOSE. IF NO RESPONSE IN 2-3 MINUTES OR PERSON RESPONDS BUT RELAPSES, REPEAT USING A NEW SPRAY DEVICE AND SPRAY INTO THE OTHER NOSTRIL. CALL 911 AFTER USE.) * EMERGENCY USE ONLY * (Patient not taking: Reported on 08/29/2022), Disp: 1 each, Rfl: 0   ondansetron (ZOFRAN) 8 MG tablet, Take 1 tablet (8 mg total) by mouth as needed for nausea or vomiting. (Patient not taking: Reported on 08/29/2022), Disp: 30 tablet, Rfl: 0 No current facility-administered medications for this visit.  Facility-Administered Medications Ordered in Other Visits:    heparin lock flush 100 UNIT/ML injection, , , ,    heparin lock flush 100 UNIT/ML injection, , , ,    heparin lock flush 100 unit/mL, 500 Units, Intracatheter, Once PRN, [name], MD   pertuzumab (PERJETA) 420 mg in sodium chloride 0.9 % 250 mL chemo infusion, 420 mg, Intravenous, Once, [name], MD, Last Rate: 528 mL/hr at 08/29/22 1218, 420 mg at 08/29/22 1218  Physical exam:  Vitals:   08/29/22 0944  BP: 130/74  Pulse: 68  Resp: 16  Temp: 98.2 F (36.8 C)  TempSrc: Oral  Weight: 141 lb 8 oz (64.2 kg)  Height: _0  (1.651 m)   Physical Exam Cardiovascular:     Rate and Rhythm: Normal rate and regular rhythm.     Heart sounds: Normal heart sounds.  Pulmonary:     Effort: Pulmonary effort is normal.     Breath  sounds: Normal breath sounds.  Abdominal:     General: Bowel sounds are normal.     Palpations: Abdomen is soft.  Musculoskeletal:     Comments: Dressing in place over left lower extremity secondary to chronic ulcerated wound.  There is new redness and swelling over the right lower extremity consistent with cellulitis  Skin:    General: Skin is warm and dry.  Neurological:     Mental Status: She is alert and oriented to person, place, and time.         Latest Ref Rng & Units 08/29/2022    8:34 AM  CMP  Glucose 70 - 99 mg/dL 120   BUN 8 - 23 mg/dL 33   Creatinine 0.44 - 1.00 mg/dL 1.67   Sodium 135 - 145 mmol/L 137   Potassium 3.5 - 5.1 mmol/L 4.1   Chloride 98 - 111 mmol/L 106   CO2 22 - 32 mmol/L 26   Calcium 8.9 - 10.3 mg/dL 8.5   Total Protein 6.5 - 8.1 g/dL 8.0   Total Bilirubin 0.3 - 1.2 mg/dL 0.5   Alkaline Phos 38 - 126 U/L 76   AST 15 - 41 U/L 21   ALT 0 - 44 U/L 12  Latest Ref Rng & Units 08/29/2022    8:34 AM  CBC  WBC 4.0 - 10.5 K/uL 6.7   Hemoglobin 12.0 - 15.0 g/dL 10.3   Hematocrit 36.0 - 46.0 % 33.4   Platelets 150 - 400 K/uL 264       CT CHEST ABDOMEN PELVIS W CONTRAST  Result Date: 08/25/2022 CLINICAL DATA:  Breast cancer, stage IV assess treatment response EXAM: CT CHEST, ABDOMEN, AND PELVIS WITH CONTRAST TECHNIQUE: Multidetector CT imaging of the chest, abdomen and pelvis was performed following the standard protocol during bolus administration of intravenous contrast. RADIATION DOSE REDUCTION: This exam was performed according to the departmental dose-optimization program which includes automated exposure control, adjustment of the mA and/or kV according to patient size and/or use of iterative reconstruction technique. body onc CONTRAST:  69m OMNIPAQUE IOHEXOL 300 MG/ML  SOLN COMPARISON:  05/04/2022 FINDINGS: CT CHEST FINDINGS Cardiovascular: Heart size is upper limits of normal. No pericardial effusion. Mild aortic atherosclerosis. Again noted are  superficial venous collateralization within the chest and lower neck which is suggestive of central venous stenosis in the setting of chronic central venous catheter access. Mediastinum/Nodes: Thyroid gland, trachea, and esophagus are unremarkable. No enlarged axillary, supraclavicular, mediastinal, or hilar lymph nodes. Lungs/Pleura: There is no pleural effusion. No airspace consolidation identified. Subpleural fibrotic changes within the anterior right upper lobe compatible with changes due to external beam radiation. Stable nodule within the medial right lower lobe measuring 4 mm, image 62/4. This is compatible with a benign nodule. No new or suspicious lung nodules. Musculoskeletal: Diffuse sclerotic bone metastases are again noted. These appear unchanged when compared with the prior exam. Status post right lumpectomy with overlying skin thickening. Right axillary node dissection. CT ABDOMEN PELVIS FINDINGS Hepatobiliary: No suspicious liver abnormality. Gallbladder appears unremarkable. Stable mild increase caliber of the common bile duct which measures up to 1 cm. No signs of choledocholithiasis or obstructing mass. Pancreas: Unremarkable. No pancreatic ductal dilatation or surrounding inflammatory changes. Spleen: Normal in size without focal abnormality. Adrenals/Urinary Tract: Normal right adrenal gland. Stable thickening of the left adrenal gland. Bilateral renal cortical scarring. No nephrolithiasis, hydronephrosis or mass. Urinary bladder is unremarkable. Stomach/Bowel: Stomach appears normal. Moderate stool burden identified within the colon. No signs of bowel wall thickening, inflammation, or distension. Vascular/Lymphatic: Aortic atherosclerosis. No aneurysm. No abdominal adenopathy. Again noted is bilateral iliac, pelvic sidewall, and inguinal adenopathy. Index left pelvic sidewall lymph node measures 1.3 cm, image 89/2. Previously 1.5 cm. Index left inguinal lymph node measures 1.5 cm, image 108/2.  Formally 1.7 cm. Index right inguinal lymph node measures 1.1 cm, image 102/2. Previously 1 cm. Reproductive: No mass identified. Other: No significant ascites.  No signs of peritoneal nodularity. Musculoskeletal: Multifocal sclerotic bone metastases are again noted and appear unchanged in the interval. IMPRESSION: 1. No significant change in the appearance of diffuse sclerotic bone metastases involving the axial and proximal appendicular skeleton. 2. No significant change in the appearance of enlarged bilateral pelvic and inguinal adenopathy. 3. Moderate stool burden is noted throughout the colon concerning for constipation. 4. Similar appearance of chest wall collaterals which is suggestive of central venous stenosis in the setting of chronic indwelling catheter access. 5. Aortic Atherosclerosis (ICD10-I70.0). Electronically Signed   By: TKerby MoorsM.D.   On: 08/25/2022 21:13     Assessment and plan- Patient is a 76y.o. female with metastatic weakly ER positive HER2 positive breast cancer with liver lymph node and bone metastases.  She is here for on treatment  assessment prior to cycle 21 of maintenance Herceptin and Perjeta  I have reviewed CT chest abdomen pelvis images independently and discussed findings with the patient which overall shows stable evidence of bone metastases and intra-abdominal adenopathy.  Plan is to continue Herceptin and Perjeta until progression or toxicity and she will receive cycle 21 of treatment today.  She will directly proceed for cycle 22 in 3 weeks and I will see her back in 6 weeks for cycle 23.  We will obtain echocardiogram and MRI brain with and without contrast prior.  Right lower extremity cellulitis: I will start her on doxycycline 100 mg twice daily for 7 days.  See covering NP in 1 week to assess response to treatment.  Patient is also on topical Zosyn for her left lower extremity wound.  Bone metastases: Calcium 8.5 today.  Zometa on hold and will be  potentially given in 3 weeks.  Patient has baseline CKD but her creatinine is somewhat elevated as compared to her baseline.  I will give her 1 L of IV fluids today   Visit Diagnosis 1. Primary malignant neoplasm of breast with metastasis ([place])   2. Carcinoma of breast metastatic to bone, unspecified laterality ([place])   3. Encounter for monoclonal antibody treatment for malignancy   4. Cellulitis of right lower extremity   5. Encounter for other specified special examinations      Dr. Randa Evens, MD, MPH [facility] at [facility] [phone] 08/29/2022 12:28 PM

## 2022-08-30 ENCOUNTER — Encounter (HOSPITAL_BASED_OUTPATIENT_CLINIC_OR_DEPARTMENT_OTHER): Payer: Medicare Other | Admitting: Internal Medicine

## 2022-08-30 DIAGNOSIS — I10 Essential (primary) hypertension: Secondary | ICD-10-CM

## 2022-08-30 DIAGNOSIS — I87311 Chronic venous hypertension (idiopathic) with ulcer of right lower extremity: Secondary | ICD-10-CM

## 2022-08-30 DIAGNOSIS — C79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: Secondary | ICD-10-CM

## 2022-08-30 DIAGNOSIS — L97822 Non-pressure chronic ulcer of other part of left lower leg with fat layer exposed: Secondary | ICD-10-CM

## 2022-08-31 ENCOUNTER — Encounter: Payer: Self-pay | Admitting: Oncology

## 2022-08-31 ENCOUNTER — Other Ambulatory Visit: Payer: Self-pay

## 2022-08-31 MED ORDER — AMPHETAMINE-DEXTROAMPHET ER 30 MG PO CP24: 30.0000 mg | ORAL_CAPSULE | Freq: Every day | ORAL | 0 refills | Status: DC

## 2022-09-01 NOTE — Progress Notes (Signed)
[name], [name] ([id number]) Visit Report for 08/30/2022 Arrival Information Details Patient Name: Laura Santiago, Laura Santiago. Date of Service: 08/30/2022 10:30 AM Medical Record Number: [id number] Patient Account Number: [id number] Date of Birth/Sex: 1946/04/28 (76 y.o. F) Treating RN: Carlene Coria Primary Care Shakeel Disney: [facility] Other Clinician: [name] Referring Demarious Kapur: [facility] Treating Mcguire Gasparyan/Extender: Yaakov Guthrie in Treatment: 32 Visit Information History Since Last Visit All ordered tests and consults were completed: No Patient Arrived: Wheel Chair Added or deleted any medications: No Arrival Time: 11:09 Any new allergies or adverse reactions: No Transfer Assistance: None Had a fall or experienced change in No Patient Requires Transmission-Based No activities of daily living that may affect Precautions: risk of falls: Patient Has Alerts: Yes Hospitalized since last visit: No Patient Alerts: PT HAS SERVICE Pain Present Now: No ANIMAL ABI 07/11/21 R) 1.16 L) 1.27 Electronic Signature(s) Signed: 08/31/2022 11:41:41 AM By: [name] Entered By: [name] on 08/30/2022 11:10:10 Laura Santiago ([id number]) -------------------------------------------------------------------------------- Clinic Level of Care Assessment Details Patient Name: Laura Santiago. Date of Service: 08/30/2022 10:30 AM Medical Record Number: [id number] Patient Account Number: [id number] Date of Birth/Sex: 1946/06/17 (76 y.o. F) Treating RN: Carlene Coria Primary Care Hani Patnode: [facility] Other Clinician: [name] Referring Marshon Bangs: [facility] Treating Ewa Hipp/Extender: Yaakov Guthrie in Treatment: 60 Clinic Level of Care Assessment Items TOOL 1 Quantity Score _0  - Use when EandM and Procedure is performed on INITIAL visit 0 ASSESSMENTS - Nursing Assessment / Reassessment _1  - General Physical Exam (combine w/  comprehensive assessment (listed just below) when performed on new 0 pt. evals) _2  - 0 Comprehensive Assessment (HX, ROS, Risk Assessments, Wounds Hx, etc.) ASSESSMENTS - Wound and Skin Assessment / Reassessment _3  - Dermatologic / Skin Assessment (not related to wound area) 0 ASSESSMENTS - Ostomy and/or Continence Assessment and Care _4  - Incontinence Assessment and Management 0 _5  - 0 Ostomy Care Assessment and Management (repouching, etc.) PROCESS - Coordination of Care _6  - Simple Patient / Family Education for ongoing care 0 _7  - 0 Complex (extensive) Patient / Family Education for ongoing care _8  - 0 Staff obtains Programmer, systems, Records, Test Results / Process Orders _9  - 0 Staff telephones HHA, Nursing Homes / Clarify orders / etc _10  - 0 Routine Transfer to another Facility (non-emergent condition) _11  - 0 Routine Hospital Admission (non-emergent condition) _12  - 0 New Admissions / Biomedical engineer / Ordering NPWT, Apligraf, etc. _13  - 0 Emergency Hospital Admission (emergent condition) PROCESS - Special Needs _14  - Pediatric / Minor Patient Management 0 _15  - 0 Isolation Patient Management _16  - 0 Hearing / Language / Visual special needs _17  - 0 Assessment of Community assistance (transportation, D/C planning, etc.) _18  - 0 Additional assistance / Altered mentation _19  - 0 Support Surface(s) Assessment (bed, cushion, seat, etc.) INTERVENTIONS - Miscellaneous _20  - External ear exam 0 _21  - 0 Patient Transfer (multiple staff / Civil Service fast streamer / Similar devices) _22  - 0 Simple Staple / Suture removal (25 or less) _23  - 0 Complex Staple / Suture removal (26 or more) _24  - 0 Hypo/Hyperglycemic Management (do not check if billed separately) _25  - 0 Ankle / Brachial Index (ABI) - do not check if billed separately Has the patient been seen at the hospital within the last three years: Yes Total Score: 0 Level Of Care: ____ Laura Santiago ([id number]) Electronic  Signature(s) Signed: 08/31/2022 11:41:41 AM By: [name] Entered By: [name] on 08/30/2022 11:45:21 Laura Santiago. ([id number]) --------------------------------------------------------------------------------  Compression Therapy Details Patient Name: Laura Santiago. Date of Service: 08/30/2022 10:30 AM Medical Record Number: [id number] Patient Account Number: [id number] Date of Birth/Sex: February 27, 1946 (76 y.o. F) Treating RN: Carlene Coria Primary Care Shyhiem Beeney: [facility], [place] Other Clinician: [name] Referring Mikela Senn: [facility] Treating Adyn Hoes/Extender: Yaakov Guthrie in Treatment: 60 Compression Therapy Performed for Wound Assessment: Wound #1 Left,Medial Lower Leg Performed By: [name], [name], Compression Type: Three Layer Pre Treatment ABI: 1.5 Post Procedure Diagnosis Same as Pre-procedure Electronic Signature(s) Signed: 08/31/2022 11:41:41 AM By: [name] Entered By: [name] on 08/30/2022 11:44:56 Laura Santiago ([id number]) -------------------------------------------------------------------------------- Compression Therapy Details Patient Name: Laura Santiago. Date of Service: 08/30/2022 10:30 AM Medical Record Number: [id number] Patient Account Number: [id number] Date of Birth/Sex: 06-Oct-1946 (77 y.o. F) Treating RN: Carlene Coria Primary Care Dani Danis: [facility], [place] Other Clinician: [name] Referring Evalisse Prajapati: [facility] Treating Oliveah Zwack/Extender: Yaakov Guthrie in Treatment: 60 Compression Therapy Performed for Wound Assessment: Wound #7 Right,Anterior Lower Leg Performed By: [name], [name], Compression Type: Three Layer Post Procedure Diagnosis Same as Pre-procedure Electronic Signature(s) Signed: 08/31/2022 11:41:41 AM By: [name] Entered By: [name] on 08/30/2022 11:45:04 Laura Santiago  ([id number]) -------------------------------------------------------------------------------- Lower Extremity Assessment Details Patient Name: Laura Santiago. Date of Service: 08/30/2022 10:30 AM Medical Record Number: [id number] Patient Account Number: [id number] Date of Birth/Sex: 1946-09-19 (76 y.o. F) Treating RN: Carlene Coria Primary Care Cy Bresee: [facility] Other Clinician: [name] Referring Jovonta Levit: [facility] Treating Tank Difiore/Extender: Yaakov Guthrie in Treatment: 60 Edema Assessment Assessed: [Left: No] [Right: No] [Left: Edema] [Right: :] Calf Left: Right: Point of Measurement: 30 cm From Medial Instep Ankle Left: Right: Point of Measurement: 10 cm From Medial Instep Electronic Signature(s) Signed: 08/31/2022 11:41:41 AM By: [name] Signed: 09/01/2022 9:44:45 AM By: Carlene Coria RN Entered By: [name] on 08/30/2022 11:27:09 Laura Santiago ([id number]) -------------------------------------------------------------------------------- Multi Wound Chart Details Patient Name: Laura Santiago. Date of Service: 08/30/2022 10:30 AM Medical Record Number: [id number] Patient Account Number: [id number] Date of Birth/Sex: 09-Sep-1946 (76 y.o. F) Treating RN: Carlene Coria Primary Care Sanja Elizardo: [facility] Other Clinician: [name] Referring Suzi Hernan: [facility], [facility] Treating Khayman Kirsch/Extender: Yaakov Guthrie in Treatment: 60 Vital Signs Height(in): 21 Pulse(bpm): 43 Weight(lbs): 153 Blood Pressure(mmHg): 143/67 Body Mass Index(BMI): 24.7 Temperature(F): 97.8 Respiratory Rate(breaths/min): 18 Photos: [N/A:N/A] Wound Location: Left, Medial Lower Leg Right, Anterior Lower Leg N/A Wounding Event: Gradually Appeared Not Known N/A Primary Etiology: Venous Leg Ulcer Cellulitis N/A Comorbid History: Hypertension, Osteoarthritis, Hypertension, Osteoarthritis, N/A Received Chemotherapy, Received  Received Chemotherapy, Received Radiation Radiation Date Acquired: 04/06/2021 08/28/2022 N/A Weeks of Treatment: 60 0 N/A Wound Status: Open Open N/A Wound Recurrence: No No N/A Clustered Wound: Yes No N/A Measurements L x W x D (cm) 7x12.6x0.2 8x5.5x1 N/A Area (cm) : 69.272 34.558 N/A Volume (cm) : 13.854 34.558 N/A % Reduction in Area: 23.30% N/A N/A % Reduction in Volume: 23.30% N/A N/A Classification: Full Thickness Without Exposed Partial Thickness N/A Support Structures Exudate Amount: Medium Medium N/A Exudate Type: Serosanguineous Serosanguineous N/A Exudate Color: red, brown red, brown N/A Wound Margin: Flat and Intact Flat and Intact N/A Granulation Amount: Medium (34-66%) None Present (0%) N/A Granulation Quality: Red, Pink N/A N/A Necrotic Amount: Medium (34-66%) N/A N/A Exposed Structures: Fat Layer (Subcutaneous Tissue): N/A N/A Yes Fascia: No Tendon: No Muscle: No Joint: No Bone: No Epithelialization: Small (1-33%) None N/A Treatment Notes Electronic Signature(s) Signed: 08/31/2022 11:41:41 AM  By: [name] Entered By: [name] on 08/30/2022 11:27:22 Laura Santiago ([id number]) Laura Santiago ([id number]) -------------------------------------------------------------------------------- [place] Details Patient Name: DAMARYS, SPEIR. Date of Service: 08/30/2022 10:30 AM Medical Record Number: [id number] Patient Account Number: [id number] Date of Birth/Sex: [birth year] (76 y.o. F) Treating RN: Carlene Coria Primary Care Ola Raap: [facility] Other Clinician: [name] Referring Evaleen Sant: [facility] Treating Skylur Fuston/Extender: Yaakov Guthrie in Treatment: 1 Active Inactive Soft Tissue Infection Nursing Diagnoses: Impaired tissue integrity Potential for infection: soft tissue Goals: Patient's soft tissue infection will resolve Date Initiated: 03/15/2022 Target Resolution Date:  04/27/2022 Goal Status: Active Signs and symptoms of infection will be recognized early to allow for prompt treatment Date Initiated: 03/15/2022 Date Inactivated: 04/26/2022 Target Resolution Date: 04/27/2022 Goal Status: Met Interventions: Assess signs and symptoms of infection every visit Treatment Activities: Culture and sensitivity : 03/15/2022 Notes: Wound/Skin Impairment Nursing Diagnoses: Impaired tissue integrity Goals: Patient/caregiver will verbalize understanding of skin care regimen Date Initiated: 07/06/2021 Date Inactivated: 07/27/2021 Target Resolution Date: 07/06/2021 Goal Status: Met Ulcer/skin breakdown will have a volume reduction of 30% by week 4 Date Initiated: 07/06/2021 Date Inactivated: 10/12/2021 Target Resolution Date: 08/06/2021 Goal Status: Unmet Unmet Reason: cont tx Ulcer/skin breakdown will have a volume reduction of 50% by week 8 Date Initiated: 07/06/2021 Target Resolution Date: 09/06/2021 Goal Status: Active Ulcer/skin breakdown will have a volume reduction of 80% by week 12 Date Initiated: 07/06/2021 Target Resolution Date: 10/06/2021 Goal Status: Active Ulcer/skin breakdown will heal within 14 weeks Date Initiated: 07/06/2021 Target Resolution Date: 11/06/2021 Goal Status: Active Interventions: Assess patient/caregiver ability to obtain necessary supplies Assess patient/caregiver ability to perform ulcer/skin care regimen upon admission and as needed Assess ulceration(s) every visit Treatment Activities: Referred to DME Dmauri Rosenow for dressing supplies : 07/06/2021 Skin care regimen initiated : 07/06/2021 Notes: VERLINE, KONG ([id number]) Electronic Signature(s) Signed: 08/31/2022 11:41:41 AM By: [name] Signed: 09/01/2022 9:44:45 AM By: Carlene Coria RN Entered By: [name] on 08/30/2022 11:27:14 Laura Santiago ([id number]) -------------------------------------------------------------------------------- Pain Assessment  Details Patient Name: Laura Santiago. Date of Service: 08/30/2022 10:30 AM Medical Record Number: [id number] Patient Account Number: [id number] Date of Birth/Sex: [birth year] (76 y.o. F) Treating RN: Carlene Coria Primary Care Maki Hege: [facility], [place] Other Clinician: [name] Referring Delbert Darley: [facility] Treating Turon Kilmer/Extender: Yaakov Guthrie in Treatment: 33 Active Problems Location of Pain Severity and Description of Pain Patient Has Paino No Site Locations Pain Management and Medication Current Pain Management: Electronic Signature(s) Signed: 08/31/2022 11:41:41 AM By: [name] Signed: 09/01/2022 9:44:45 AM By: Carlene Coria RN Entered By: [name] on 08/30/2022 11:15:19 Laura Santiago ([id number]) -------------------------------------------------------------------------------- Wound Assessment Details Patient Name: VIKA, BUSKE. Date of Service: 08/30/2022 10:30 AM Medical Record Number: [id number] Patient Account Number: [id number] Date of Birth/Sex: [birth year] (76 y.o. F) Treating RN: Carlene Coria Primary Care Francee Setzer: [facility] Other Clinician: [name] Referring Kimani Bedoya: [facility] Treating Shemeca Lukasik/Extender: Yaakov Guthrie in Treatment: 60 Wound Status Wound Number: 1 Primary Venous Leg Ulcer Etiology: Wound Location: Left, Medial Lower Leg Wound Status: Open Wounding Event: Gradually Appeared Comorbid Hypertension, Osteoarthritis, Received Chemotherapy, Date Acquired: 04/06/2021 History: Received Radiation Weeks Of Treatment: 60 Clustered Wound: Yes Photos Wound Measurements Length: (cm) 7 Width: (cm) 12.6 Depth: (cm) 0.2 Area: (cm) 69.272 Volume: (cm) 13.854 % Reduction in Area: 23.3% % Reduction in Volume: 23.3% Epithelialization: Small (1-33%) Wound Description Classification: Full Thickness Without Exposed Support Structu Wound Margin: Flat and Intact Exudate  Amount: Medium Exudate Type: Serosanguineous Exudate Color: red, brown res Foul Odor After Cleansing: No Slough/Fibrino Yes Wound Bed Granulation Amount: Medium (34-66%) Exposed Structure Granulation Quality: Red, Pink Fascia Exposed: No Necrotic Amount: Medium (34-66%) Fat Layer (Subcutaneous Tissue) Exposed: Yes Necrotic Quality: Adherent Slough Tendon Exposed: No Muscle Exposed: No Joint Exposed: No Bone Exposed: No Electronic Signature(s) Signed: 08/31/2022 11:41:41 AM By: [name] Signed: 09/01/2022 9:44:45 AM By: Carlene Coria RN Entered By: [name] on 08/30/2022 11:24:27 Laura Santiago ([id number]) -------------------------------------------------------------------------------- Wound Assessment Details Patient Name: Laura Santiago. Date of Service: 08/30/2022 10:30 AM Medical Record Number: [id number] Patient Account Number: [id number] Date of Birth/Sex: 1946/11/15 (76 y.o. F) Treating RN: Carlene Coria Primary Care Maryanna Stuber: [facility] Other Clinician: [name] Referring Yeiren Whitecotton: [facility] Treating Minola Guin/Extender: Yaakov Guthrie in Treatment: 60 Wound Status Wound Number: 7 Primary Cellulitis Etiology: Wound Location: Right, Anterior Lower Leg Wound Status: Open Wounding Event: Not Known Comorbid Hypertension, Osteoarthritis, Received Chemotherapy, Date Acquired: 08/28/2022 History: Received Radiation Weeks Of Treatment: 0 Clustered Wound: No Photos Wound Measurements Length: (cm) 8 Width: (cm) 5.5 Depth: (cm) 1 Area: (cm) 34.558 Volume: (cm) 34.558 % Reduction in Area: % Reduction in Volume: Epithelialization: None Tunneling: No Undermining: No Wound Description Classification: Partial Thickness Wound Margin: Flat and Intact Exudate Amount: Medium Exudate Type: Serosanguineous Exudate Color: red, brown Foul Odor After Cleansing: No Slough/Fibrino No Wound Bed Granulation Amount: None Present  (0%) Electronic Signature(s) Signed: 08/31/2022 11:41:41 AM By: [name] Signed: 09/01/2022 9:44:45 AM By: Carlene Coria RN Entered By: [name] on 08/30/2022 11:26:50 Laura Santiago ([id number]) -------------------------------------------------------------------------------- Vitals Details Patient Name: Laura Santiago. Date of Service: 08/30/2022 10:30 AM Medical Record Number: [id number] Patient Account Number: [id number] Date of Birth/Sex: 1946-08-02 (76 y.o. F) Treating RN: Carlene Coria Primary Care Alice Vitelli: [facility] Other Clinician: [name] Referring Plez Belton: [facility] Treating Eternity Dexter/Extender: Yaakov Guthrie in Treatment: 60 Vital Signs Time Taken: 11:10 Temperature (F): 97.8 Height (in): 66 Pulse (bpm): 71 Weight (lbs): 153 Respiratory Rate (breaths/min): 18 Body Mass Index (BMI): 24.7 Blood Pressure (mmHg): 143/67 Reference Range: 80 - 120 mg / dl Electronic Signature(s) Signed: 08/31/2022 11:41:41 AM By: [name] Entered By: [name] on 08/30/2022 11:15:14

## 2022-09-01 NOTE — Progress Notes (Signed)
ARTELIA, GAME ([id number]) Visit Report for 08/30/2022 Chief Complaint Document Details Patient Name: Laura Santiago, Laura Santiago. Date of Service: 08/30/2022 10:30 AM Medical Record Number: [id number] Patient Account Number: [id number] Date of Birth/Sex: 03/03/1946 (76 y.o. F) Treating RN: Carlene Coria Primary Care Provider: Hilda Lias, [place] Other Clinician: [name] Referring Provider: [facility] Treating Provider/Extender: Yaakov Guthrie in Treatment: 60 Information Obtained from: Patient Chief Complaint Left lower extremity wound Right toe wounds Left upper lateral thigh wounds Electronic Signature(s) Signed: 08/30/2022 12:23:50 PM By: Kalman Shan DO Entered By: Kalman Shan on 08/30/2022 12:00:41 Laura Santiago ([id number]) -------------------------------------------------------------------------------- Debridement Details Patient Name: Laura Santiago. Date of Service: 08/30/2022 10:30 AM Medical Record Number: [id number] Patient Account Number: [id number] Date of Birth/Sex: 10-01-1946 (76 y.o. F) Treating RN: Carlene Coria Primary Care Provider: [facility] Other Clinician: [name] Referring Provider: [facility] Treating Provider/Extender: Yaakov Guthrie in Treatment: 60 Debridement Performed for Wound #1 Left,Medial Lower Leg Assessment: Performed By: Physician Kalman Shan, MD Debridement Type: Debridement Severity of Tissue Pre Debridement: Fat layer exposed Level of Consciousness (Pre- Awake and Alert procedure): Pre-procedure Verification/Time Out Yes - 11:37 Taken: Start Time: 11:37 Total Area Debrided (L x W): 7 (cm) x 12.6 (cm) = 88.2 (cm) Tissue and other material Viable, Non-Viable, Slough, Subcutaneous, Slough debrided: Level: Skin/Subcutaneous Tissue Debridement Description: Excisional Instrument: Curette Bleeding: Minimum Hemostasis Achieved: Pressure End Time: 11:42 Response  to Treatment: Procedure was tolerated well Level of Consciousness (Post- Awake and Alert procedure): Post Debridement Measurements of Total Wound Length: (cm) 7 Width: (cm) 12.6 Depth: (cm) 0.2 Volume: (cm) 13.854 Character of Wound/Ulcer Post Debridement: Stable Severity of Tissue Post Debridement: Fat layer exposed Post Procedure Diagnosis Same as Pre-procedure Electronic Signature(s) Signed: 08/30/2022 12:23:50 PM By: Kalman Shan DO Signed: 08/31/2022 11:41:41 AM By: [name] Signed: 09/01/2022 9:44:45 AM By: Carlene Coria RN Entered By: [name] on 08/30/2022 11:40:19 Laura Santiago ([id number]) -------------------------------------------------------------------------------- HPI Details Patient Name: Laura Santiago. Date of Service: 08/30/2022 10:30 AM Medical Record Number: [id number] Patient Account Number: [id number] Date of Birth/Sex: 1946/07/18 (76 y.o. F) Treating RN: Carlene Coria Primary Care Provider: Hilda Lias, [place] Other Clinician: [name] Referring Provider: [facility] Treating Provider/Extender: Yaakov Guthrie in Treatment: 60 History of Present Illness HPI Description: Admission 7/27 Ms. Shadia Larose is a 76 year old female with a past medical history of ADHD, metastatic breast cancer, stage IV chronic kidney disease, history of DVT on Xarelto and chronic venous insufficiency that presents to the clinic for a chronic left lower extremity wound. She recently moved to [facility] 4 days ago. She was being followed by wound care center in [name]. She reports a 10-year history of wounds to her left lower extremity that eventually do heal with debridement and compression therapy. She states that the current wound reopened 4 months ago and she is using Vaseline and Coban. She denies signs of infection. 8/3; patient presents for 1 week follow-up. She reports no issues or complaints today. She states she had  vascular studies done in the last week. She denies signs of infection. She brought her little service dog with her today. 8/17; patient presents for follow-up. She has missed her last clinic appointment. She states she took the wrap off and attempted to rewrap her leg. She is having difficulty with transportation. She has her service dog with her today. Overall she feels well and reports improvement in wound healing. She denies signs of infection. She reports  owning an old Velcro wrap compression and has this at her living facility 9/14; patient presents for follow-up. Patient states that over the past 2 to 3 weeks she developed toe wounds to her right foot. She attributes this to tight fitting shoes. She subsequently developed cellulitis in the right leg and has been treated by doxycycline by her oncologist. She reports improvement in symptoms however continues to have some redness and swelling to this leg. To the left lower extremity patient has been having her wraps changed with home health twice weekly. She states that the [facility] is not helping control the drainage. Other than that she has no issues or complaints today. She denies signs of infection to the left lower extremity. 9/21; patient presents for follow-up. She reports seeing infectious disease for her cellulitis. She reports no further management. She has home health that changes the wraps twice weekly. She has no issues or complaints today. She denies signs of infection. 10/5; patient presents for follow-up. She has no issues or complaints today. She denies signs of infection. She states that the right great toe has not been dressed by home health. 10/12; patient presents for follow-up. She has no issues or complaints today. She reports improvement in her wound healing. She has been using silver alginate to the right great toe wound. She denies signs of infection. 10/26; patient presents for follow-up. Home health did not have  sorbact so they continued to use Hydrofera Blue under the wrap. She has been using silver alginate to the great toe wound however she did not have a dressing in place today. She currently denies signs of infection. 11/2; patient presents for follow-up. She has been using sorb act under the compression wrap. She reports using silver alginate to the toe wound again she does not have a dressing in place. She currently denies signs of infection. 11/23; patient presents for follow-up. Unfortunately she has missed her last 2 clinic appointments. She was last seen 3 weeks ago. She did her own compression wrap with Kerlix and Coban yesterday after seeing vein and vascular. She has not been dressing her right great toe wound. She currently denies signs of infection. 11/30; patient presents for 1 week follow-up. She states she changed her dressing last week prior to home health and use sorb act with Dakin's and Hydrofera Blue. Home health has changed the dressing as well and they have been using sorbact. Today she reports increased redness to her right lower extremity. She has a history of cellulitis to this leg. She has been using silver alginate to the right great toe. Unfortunately she had an episode of diarrhea prior to coming in and had feces all over the right leg and to the wrap of her left leg. 12/7; patient presents for 1 week follow-up. She states that home health did not come out to change the dressing and she took it off yesterday. It is unclear if she is dressing the right toe wound. She denies signs of infection. 12/14; patient presents for 1 week follow-up. She has no issues or complaints today. 12/21; patient presents for follow-up. She has no issues or complaints today. She denies signs of infection. 12/28/2021; patient presents for follow-up. She was hospitalized for sepsis secondary to right lower extremity cellulitis On 12/23. She states she is currently at a SNF. She states that she was  started on doxycycline this morning for her right great toe swelling and redness. She is not sure what dressings have been done to  her left lower extremity for the past 3 weeks. She says its been mainly gauze with an Ace wrap. 1/25; patient presents for follow-up. She is still residing in a skilled nursing facility. She reports mild pain to the left lower extremity wound bed. She states she is going to see a podiatrist soon. 2/8; patient presents for follow-up. She has moved back to her residential community from her skilled nursing facility. She has no issues or complaints today. She denies signs of systemic infections. 2/15; patient presents for follow-up. He has no issues or complaints today. She denies systemic signs of infection. 2/22; patient presents for follow-up. She has no issues or complaints today. She denies signs of infection. Laura Santiago, Laura Santiago ([id number]) 3/1; patient presents for follow-up. She states that home health came out the day after she was seen in our clinic and yesterday to do the wrap change. She denies signs of infection. She reports excoriated skin on the ankle. 3/8; patient presents for follow-up. She has no issues or complaints today. She denies signs of infection. 3/15; patient presents for follow-up. Home health has been coming out to change the dressings. She reports more tenderness to the wound site. She denies purulent drainage, increased warmth or erythema to the area. 4/5; patient presents for follow-up. She has missed her last 2 clinic appointments. I have not seen her in 3 weeks. She was recently hospitalized for altered mental status. She was involuntarily committed. She was evaluated by psychiatry and deemed to have competency. There was no specific cause of her altered mental status. It was concluded that her physical and mental health were declining due to her chronic medical conditions. Currently home health has been coming out for dressing changes.  Patient has also been doing her own dressing changes. She reports more skin breakdown to the periwound and now has a new wound. She denies fever/chills. She reports continued tenderness to the wound site. 4/12; patient with significant venous insufficiency and a large wound on her left lower leg taking up about 80% of the circumference of her lower leg. Cultures of this grew MRSA and Pseudomonas. She had completed a course of ciprofloxacin now is starting doxycycline. She has been using Dakin's wet-to-dry and a Tubigrip. She has home health twice a week and we change it once. 4/19; patient presents for follow-up. She completed her course of doxycycline. She has been using Dakin's wet-to-dry dressing and Tubigrip. Home health changes the dressing twice weekly. Currently she has no issues or complaints. 4/26; patient presents for follow-up. At last clinic visit orders for home health were Iodosorb under compression therapy. Unfortunately they did not have the dressing and have been using Dakin's and gentamicin under the wrap. Patient currently denies signs of infection. She has no issues or complaints today. 5/3; patient presents for follow-up. Again Iodosorb has not been used under the compression therapy when home health comes out to change the wrap and dressing. They have been using Sorbact. It is unclear why this is happening since we send orders weekly to the agency. She denies signs of infection. Patient has not purchased the [facility] antibiotics. We reached out to the company and they said they have been trying to contact her on a regular basis. We gave the patient the number to call to order the medication. 5/10; patient presents for follow-up. She has no issues or complaints today. Again home health has not been using Iodosorb. Mepilex was on the wound bed. No other dressings noted. She brought in  her Keystone antibiotics. She denies signs of infection. 5/17; patient presents for follow-up.  Home health has come out twice since she was last seen. Joint well she has been using Keystone antibiotic with Sorbact under the compression wrap. She has no issues or complaints today. She denies signs of infection. 5/24; patient presents for follow-up. We have been using Keystone antibiotics with Sorbact under compression therapy. She is tolerating the treatment well. She is reporting improvement in wound healing. She denies signs of infection. 5/31; patient presents for follow-up. We continue to do [facility] antibiotics with Sorbact under compression therapy. She continues to report improvement in wound healing. Home health comes out and changes the dressing once weekly. 05-17-2022 upon evaluation today patient appears to be doing better in regard to her wound especially compared to the last time I saw her. Fortunately I do think that she is seeing improvements. With that being said I do believe that she may be benefit from sharp debridement today to clear away some of the necrotic debris I discussed that with her as well. She is an amendable to that plan. Otherwise she is very pleased with how the [employer] is doing for her. 6/14; patient presents for follow-up. We have been using Keystone antibiotic with Sorbact and absorbent dressings under 3 layer compression. She has no issues or complaints today. She reports improvement in wound healing. She denies signs of infection. 6/21; patient presents for follow-up. We are continuing with [facility] antibiotic and Sorbact under 3 layer compression. Patient has no complaints. Continued wound healing is happening. She denies signs of infection. 6/28; patient presents for follow-up. We have been using Keystone antibiotic with Sorbact under 3 layer compression. Usually home health comes out and changes the dressing twice a week. Unfortunately they did not go out to change the dressing. It is unclear why. Patient did not call them. She currently denies signs of  infection. 7/5; patient presents for follow-up. We have been using Keystone antibiotic with calcium alginate under 3 layer compression. She reports improvement in wound healing. She denies signs of infection. Home health has come out to do dressing changes twice this past week. 7/12; patient presents for follow-up. We have been using Keystone antibiotic with calcium alginate under 3 layer compression. Patient states that home health came out once last week to change the dressing. She reports improvement in wound healing. She currently denies signs of infection. 7/19; patient presents for follow-up. We have been using Keystone antibiotic with calcium alginate under 3 layer compression. Home health came out once last week to change the dressing. She has no issues or complaints today. She denies signs of infection. 8/2; patient presents for follow-up. We have been using Keystone antibiotic with calcium alginate under 3 layer compression. Unfortunately she missed her appointment last week and home health did not come out to do dressing changes. Patient currently denies signs of infection. 8/9; patient presents for follow-up. We have been using Keystone with calcium alginate under 3 layer compression. She states that home health came out once last week. She currently denies signs of infection. Her wrap was completely wet. She states she was cleaning the top of the leg and water soaked down into the wrap. 8/16; patient presents for follow-up. We have been using Keystone with calcium alginate under 3 layer compression. She states that home health came out twice last week. She has no issues or complaints today. 8/23; patient presents for follow-up. He has been using Keystone with calcium alginate under 3  layer compression. Home health came out twice last week. She denies signs of infection. 8/30; patient presents for follow-up. We have been using Keystone with calcium alginate under 3 layer compression. Home  health came out once Laura Santiago, Laura Santiago. ([id number]) last week to change the dressing. Patient reports improvement in wound healing. She states she is almost done with her chemotherapy infusions and has 1 more left. 9/13; patient presents for follow-up. She has lost the capsules to her [facility] antibiotic which I believe is the vancomycin pills. She has her Zosyn powder today. We have been using Keystone antibiotic ointment with calcium alginate under 3 layer compression. She is concerned about systemic infection however her vitals are stable and there is no surrounding soft tissue infection. She would like to remain a patient in our wound care center however would like a second opinion for her wound care at another facility. She asked to be referred to [facility] wound care center. 9/20; patient presents for follow-up. She found her vancomycin capsules and brought in her complete Keystone antibiotic ointment set today. Unfortunately she has developed skin breakdown and Erythema to the right lower extremity With increased swelling. She states she went to a pow wow Over the weekend and was on her feet for extended periods of time. She saw her oncologist yesterday who prescribed her doxycycline for her right lower extremity erythema. Electronic Signature(s) Signed: 08/30/2022 12:23:50 PM By: Kalman Shan DO Entered By: Kalman Shan on 08/30/2022 12:06:04 Laura Santiago ([id number]) -------------------------------------------------------------------------------- Physical Exam Details Patient Name: Laura Santiago, Laura Santiago. Date of Service: 08/30/2022 10:30 AM Medical Record Number: [id number] Patient Account Number: [id number] Date of Birth/Sex: [birth year] (76 y.o. F) Treating RN: Carlene Coria Primary Care Provider: [facility] Other Clinician: [name] Referring Provider: [facility] Treating Provider/Extender: Yaakov Guthrie in Treatment:  71 Constitutional . Cardiovascular . Psychiatric . Notes Left lower extremity: Large open wound with granulation tissue and nonviable tissue. No signs of surrounding soft tissue infection. Right lower extremity: 2+ pitting edema to the knee. Scattered areas of skin breakdown to the anterior aspect with minimal weeping. Increased erythema although no increased warmth. No purulent drainage. Electronic Signature(s) Signed: 08/30/2022 12:23:50 PM By: Kalman Shan DO Entered By: Kalman Shan on 08/30/2022 12:06:42 Laura Santiago ([id number]) -------------------------------------------------------------------------------- Physician Orders Details Patient Name: Laura Santiago. Date of Service: 08/30/2022 10:30 AM Medical Record Number: [id number] Patient Account Number: [id number] Date of Birth/Sex: 08-04-1946 (76 y.o. F) Treating RN: Carlene Coria Primary Care Provider: [facility] Other Clinician: [name] Referring Provider: [facility] Treating Provider/Extender: Yaakov Guthrie in Treatment: 36 Verbal / Phone Orders: No Diagnosis Coding Follow-up Appointments o Return Appointment in 1 week. o Nurse Visit as needed [place] for wound care. May utilize formulary equivalent dressing for wound treatment orders unless otherwise specified. Home Health Nurse may visit PRN to address patientos wound care needs. o **Please direct any NON-WOUND related issues/requests for orders to patient's Primary Care Physician. **If current dressing causes regression in wound condition, may D/C ordered dressing product/s and apply Normal Saline Moist Dressing daily until next [place] or Other MD appointment. **Notify Wound Healing Center of regression in wound condition at [phone]. o Other Home Health Orders/Instructions: - Dressing change 3 x weekly, twice by home health  and once at wound clinic weekly. PLEASE make sure frequency between dressing changes is appropriate. Bathing/ Shower/ Hygiene o May shower with wound  dressing protected with water repellent cover or cast protector. o No tub bath. Anesthetic (Use 'Patient Medications' Section for Anesthetic Order Entry) o Lidocaine applied to wound bed Edema Control - Lymphedema / Segmental Compressive Device / Other o Optional: One layer of unna paste to top of compression wrap (to act as an anchor). - PLEASE when applying wrap start from toes and go up to just below knee o Elevate, Exercise Daily and Avoid Standing for Long Periods of Time. o Elevate legs to the level of the heart and pump ankles as often as possible o Elevate leg(s) parallel to the floor when sitting. o DO YOUR BEST to sleep in the bed at night. DO NOT sleep in your recliner. Long hours of sitting in a recliner leads to swelling of the legs and/or potential wounds on your backside. Additional Orders / Instructions o Follow Nutritious Diet and Increase Protein Intake Wound Treatment Wound #1 - Lower Leg Wound Laterality: Left, Medial Cleanser: Wound Cleanser (Home Health) 3 x Per Week/30 Days Discharge Instructions: Wash your hands with soap and water. Remove old dressing, discard into plastic bag and place into trash. Cleanse the wound with Wound Cleanser prior to applying a clean dressing using gauze sponges, not tissues or cotton balls. Do not scrub or use excessive force. Pat dry using gauze sponges, not tissue or cotton balls. Peri-Wound Care: Nystatin Cream USP 30 (g) 3 x Per Week/30 Days Discharge Instructions: Use Nystatin Cream as directed. MIx 1:1 with TCA cream and applied to peri wound Peri-Wound Care: Triamcinolone Acetonide Cream, 0.1%, 15 (g) tube 3 x Per Week/30 Days Discharge Instructions: Apply as directed. mix 1:1 with nystatin cream for peri wound Primary Dressing: Aquacel Extra Hydrofiber Dressing,  4x5 (in/in) 3 x Per Week/30 Days Primary Dressing: keystone compound 3 x Per Week/30 Days Discharge Instructions: apply to wound bed as directed on medication label PLEASE follow direction on pill bottle for proper mixing Secondary Dressing: Zetuvit Absorbent Pad, 4x8 (in/in) 3 x Per Week/30 Days Compression Wrap: 3-LAYER WRAP - Profore Lite LF 3 Multilayer Compression Bandaging System 3 x Per Week/30 Days Discharge Instructions: Apply 3 multi-layer wrap as prescribed. Laura Santiago, Laura Santiago ([id number]) Wound #7 - Lower Leg Wound Laterality: Right, Anterior Cleanser: Wound Cleanser (Home Health) 3 x Per Week/30 Days Discharge Instructions: Wash your hands with soap and water. Remove old dressing, discard into plastic bag and place into trash. Cleanse the wound with Wound Cleanser prior to applying a clean dressing using gauze sponges, not tissues or cotton balls. Do not scrub or use excessive force. Pat dry using gauze sponges, not tissue or cotton balls. Peri-Wound Care: Nystatin Cream USP 30 (g) 3 x Per Week/30 Days Discharge Instructions: Use Nystatin Cream as directed. MIx 1:1 with TCA cream and applied to peri wound Peri-Wound Care: Triamcinolone Acetonide Cream, 0.1%, 15 (g) tube 3 x Per Week/30 Days Discharge Instructions: Apply as directed. mix 1:1 with nystatin cream for peri wound Primary Dressing: Aquacel Extra Hydrofiber Dressing, 4x5 (in/in) 3 x Per Week/30 Days Primary Dressing: keystone compound 3 x Per Week/30 Days Discharge Instructions: apply to wound bed as directed on medication label PLEASE follow direction on pill bottle for proper mixing Secondary Dressing: Zetuvit Absorbent Pad, 4x8 (in/in) 3 x Per Week/30 Days Compression Wrap: 3-LAYER WRAP - Profore Lite LF 3 Multilayer Compression Bandaging System 3 x Per Week/30 Days Discharge Instructions: Apply 3 multi-layer wrap as prescribed. Electronic Signature(s) Signed: 08/30/2022 12:23:50 PM By: Kalman Shan DO Entered  By: Heber Atkins,  Jaysun Wessels on 08/30/2022 12:11:29 Laura Santiago, Laura Santiago ([id number]) -------------------------------------------------------------------------------- Problem List Details Patient Name: Laura Santiago, Laura Santiago. Date of Service: 08/30/2022 10:30 AM Medical Record Number: [id number] Patient Account Number: [id number] Date of Birth/Sex: December 14, 1945 (76 y.o. F) Treating RN: Carlene Coria Primary Care Provider: [facility] Other Clinician: [name] Referring Provider: [facility] Treating Provider/Extender: Yaakov Guthrie in Treatment: 56 Active Problems ICD-10 Encounter Code Description Active Date MDM Diagnosis L97.822 Non-pressure chronic ulcer of other part of left lower leg with fat layer 11/02/2021 No Yes exposed I87.312 Chronic venous hypertension (idiopathic) with ulcer of left lower 11/02/2021 No Yes extremity I87.311 Chronic venous hypertension (idiopathic) with ulcer of right lower 08/30/2022 No Yes extremity I87.2 Venous insufficiency (chronic) (peripheral) 07/06/2021 No Yes Z79.01 Long term (current) use of anticoagulants 07/06/2021 No Yes I10 Essential (primary) hypertension 07/06/2021 No Yes 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 07/06/2021 No Yes Inactive Problems ICD-10 Code Description Active Date Inactive Date S81.802A Unspecified open wound, left lower leg, initial encounter 07/06/2021 07/06/2021 S91.101A Unspecified open wound of right great toe without damage to nail, initial 08/24/2021 08/24/2021 encounter S91.104A Unspecified open wound of right lesser toe(s) without damage to nail, initial 08/24/2021 08/24/2021 encounter Resolved Problems ICD-10 LENZIE, MONTESANO ([id number]) Code Description Active Date Resolved Date S91.104D Unspecified open wound of right lesser toe(s) without damage to nail, 08/31/2021 08/31/2021 subsequent encounter S91.201D Unspecified open wound of right great toe with damage to nail, subsequent 08/31/2021  08/31/2021 encounter Electronic Signature(s) Signed: 08/30/2022 12:23:50 PM By: Kalman Shan DO Entered By: Kalman Shan on 08/30/2022 12:00:36 Laura Santiago ([id number]) -------------------------------------------------------------------------------- Progress Note Details Patient Name: Laura Santiago. Date of Service: 08/30/2022 10:30 AM Medical Record Number: [id number] Patient Account Number: [id number] Date of Birth/Sex: 09-Aug-1946 (76 y.o. F) Treating RN: Carlene Coria Primary Care Provider: Hilda Lias, [place] Other Clinician: [name] Referring Provider: [facility] Treating Provider/Extender: Yaakov Guthrie in Treatment: 60 Subjective Chief Complaint Information obtained from Patient Left lower extremity wound Right toe wounds Left upper lateral thigh wounds History of Present Illness (HPI) Admission 7/27 Ms. Twania Bujak is a 76 year old female with a past medical history of ADHD, metastatic breast cancer, stage IV chronic kidney disease, history of DVT on Xarelto and chronic venous insufficiency that presents to the clinic for a chronic left lower extremity wound. She recently moved to [facility] 4 days ago. She was being followed by wound care center in [name]. She reports a 10-year history of wounds to her left lower extremity that eventually do heal with debridement and compression therapy. She states that the current wound reopened 4 months ago and she is using Vaseline and Coban. She denies signs of infection. 8/3; patient presents for 1 week follow-up. She reports no issues or complaints today. She states she had vascular studies done in the last week. She denies signs of infection. She brought her little service dog with her today. 8/17; patient presents for follow-up. She has missed her last clinic appointment. She states she took the wrap off and attempted to rewrap her leg. She is having difficulty with  transportation. She has her service dog with her today. Overall she feels well and reports improvement in wound healing. She denies signs of infection. She reports owning an old Velcro wrap compression and has this at her living facility 9/14; patient presents for follow-up. Patient states that over the past 2 to 3 weeks she developed toe wounds to her right foot. She attributes this to tight  fitting shoes. She subsequently developed cellulitis in the right leg and has been treated by doxycycline by her oncologist. She reports improvement in symptoms however continues to have some redness and swelling to this leg. To the left lower extremity patient has been having her wraps changed with home health twice weekly. She states that the [facility] is not helping control the drainage. Other than that she has no issues or complaints today. She denies signs of infection to the left lower extremity. 9/21; patient presents for follow-up. She reports seeing infectious disease for her cellulitis. She reports no further management. She has home health that changes the wraps twice weekly. She has no issues or complaints today. She denies signs of infection. 10/5; patient presents for follow-up. She has no issues or complaints today. She denies signs of infection. She states that the right great toe has not been dressed by home health. 10/12; patient presents for follow-up. She has no issues or complaints today. She reports improvement in her wound healing. She has been using silver alginate to the right great toe wound. She denies signs of infection. 10/26; patient presents for follow-up. Home health did not have sorbact so they continued to use Hydrofera Blue under the wrap. She has been using silver alginate to the great toe wound however she did not have a dressing in place today. She currently denies signs of infection. 11/2; patient presents for follow-up. She has been using sorb act under the compression  wrap. She reports using silver alginate to the toe wound again she does not have a dressing in place. She currently denies signs of infection. 11/23; patient presents for follow-up. Unfortunately she has missed her last 2 clinic appointments. She was last seen 3 weeks ago. She did her own compression wrap with Kerlix and Coban yesterday after seeing vein and vascular. She has not been dressing her right great toe wound. She currently denies signs of infection. 11/30; patient presents for 1 week follow-up. She states she changed her dressing last week prior to home health and use sorb act with Dakin's and Hydrofera Blue. Home health has changed the dressing as well and they have been using sorbact. Today she reports increased redness to her right lower extremity. She has a history of cellulitis to this leg. She has been using silver alginate to the right great toe. Unfortunately she had an episode of diarrhea prior to coming in and had feces all over the right leg and to the wrap of her left leg. 12/7; patient presents for 1 week follow-up. She states that home health did not come out to change the dressing and she took it off yesterday. It is unclear if she is dressing the right toe wound. She denies signs of infection. 12/14; patient presents for 1 week follow-up. She has no issues or complaints today. 12/21; patient presents for follow-up. She has no issues or complaints today. She denies signs of infection. 12/28/2021; patient presents for follow-up. She was hospitalized for sepsis secondary to right lower extremity cellulitis On 12/23. She states she is currently at a SNF. She states that she was started on doxycycline this morning for her right great toe swelling and redness. She is not sure what dressings have been done to her left lower extremity for the past 3 weeks. She says its been mainly gauze with an Ace wrap. 1/25; patient presents for follow-up. She is still residing in a skilled  nursing facility. She reports mild pain to the left lower  extremity wound bed. She states she is going to see a podiatrist soon. 2/8; patient presents for follow-up. She has moved back to her residential community from her skilled nursing facility. She has no issues or complaints today. She denies signs of systemic infections. Laura Santiago, Laura Santiago ([id number]) 2/15; patient presents for follow-up. He has no issues or complaints today. She denies systemic signs of infection. 2/22; patient presents for follow-up. She has no issues or complaints today. She denies signs of infection. 3/1; patient presents for follow-up. She states that home health came out the day after she was seen in our clinic and yesterday to do the wrap change. She denies signs of infection. She reports excoriated skin on the ankle. 3/8; patient presents for follow-up. She has no issues or complaints today. She denies signs of infection. 3/15; patient presents for follow-up. Home health has been coming out to change the dressings. She reports more tenderness to the wound site. She denies purulent drainage, increased warmth or erythema to the area. 4/5; patient presents for follow-up. She has missed her last 2 clinic appointments. I have not seen her in 3 weeks. She was recently hospitalized for altered mental status. She was involuntarily committed. She was evaluated by psychiatry and deemed to have competency. There was no specific cause of her altered mental status. It was concluded that her physical and mental health were declining due to her chronic medical conditions. Currently home health has been coming out for dressing changes. Patient has also been doing her own dressing changes. She reports more skin breakdown to the periwound and now has a new wound. She denies fever/chills. She reports continued tenderness to the wound site. 4/12; patient with significant venous insufficiency and a large wound on her left lower leg taking  up about 80% of the circumference of her lower leg. Cultures of this grew MRSA and Pseudomonas. She had completed a course of ciprofloxacin now is starting doxycycline. She has been using Dakin's wet-to-dry and a Tubigrip. She has home health twice a week and we change it once. 4/19; patient presents for follow-up. She completed her course of doxycycline. She has been using Dakin's wet-to-dry dressing and Tubigrip. Home health changes the dressing twice weekly. Currently she has no issues or complaints. 4/26; patient presents for follow-up. At last clinic visit orders for home health were Iodosorb under compression therapy. Unfortunately they did not have the dressing and have been using Dakin's and gentamicin under the wrap. Patient currently denies signs of infection. She has no issues or complaints today. 5/3; patient presents for follow-up. Again Iodosorb has not been used under the compression therapy when home health comes out to change the wrap and dressing. They have been using Sorbact. It is unclear why this is happening since we send orders weekly to the agency. She denies signs of infection. Patient has not purchased the [place] antibiotics. We reached out to the company and they said they have been trying to contact her on a regular basis. We gave the patient the number to call to order the medication. 5/10; patient presents for follow-up. She has no issues or complaints today. Again home health has not been using Iodosorb. Mepilex was on the wound bed. No other dressings noted. She brought in her [facility] antibiotics. She denies signs of infection. 5/17; patient presents for follow-up. Home health has come out twice since she was last seen. Joint well she has been using Keystone antibiotic with Sorbact under the compression wrap. She has no  issues or complaints today. She denies signs of infection. 5/24; patient presents for follow-up. We have been using Keystone antibiotics with  Sorbact under compression therapy. She is tolerating the treatment well. She is reporting improvement in wound healing. She denies signs of infection. 5/31; patient presents for follow-up. We continue to do [facility] antibiotics with Sorbact under compression therapy. She continues to report improvement in wound healing. Home health comes out and changes the dressing once weekly. 05-17-2022 upon evaluation today patient appears to be doing better in regard to her wound especially compared to the last time I saw her. Fortunately I do think that she is seeing improvements. With that being said I do believe that she may be benefit from sharp debridement today to clear away some of the necrotic debris I discussed that with her as well. She is an amendable to that plan. Otherwise she is very pleased with how the [employer] is doing for her. 6/14; patient presents for follow-up. We have been using Keystone antibiotic with Sorbact and absorbent dressings under 3 layer compression. She has no issues or complaints today. She reports improvement in wound healing. She denies signs of infection. 6/21; patient presents for follow-up. We are continuing with [facility] antibiotic and Sorbact under 3 layer compression. Patient has no complaints. Continued wound healing is happening. She denies signs of infection. 6/28; patient presents for follow-up. We have been using Keystone antibiotic with Sorbact under 3 layer compression. Usually home health comes out and changes the dressing twice a week. Unfortunately they did not go out to change the dressing. It is unclear why. Patient did not call them. She currently denies signs of infection. 7/5; patient presents for follow-up. We have been using Keystone antibiotic with calcium alginate under 3 layer compression. She reports improvement in wound healing. She denies signs of infection. Home health has come out to do dressing changes twice this past week. 7/12; patient  presents for follow-up. We have been using Keystone antibiotic with calcium alginate under 3 layer compression. Patient states that home health came out once last week to change the dressing. She reports improvement in wound healing. She currently denies signs of infection. 7/19; patient presents for follow-up. We have been using Keystone antibiotic with calcium alginate under 3 layer compression. Home health came out once last week to change the dressing. She has no issues or complaints today. She denies signs of infection. 8/2; patient presents for follow-up. We have been using Keystone antibiotic with calcium alginate under 3 layer compression. Unfortunately she missed her appointment last week and home health did not come out to do dressing changes. Patient currently denies signs of infection. 8/9; patient presents for follow-up. We have been using Keystone with calcium alginate under 3 layer compression. She states that home health came out once last week. She currently denies signs of infection. Her wrap was completely wet. She states she was cleaning the top of the leg and water soaked down into the wrap. 8/16; patient presents for follow-up. We have been using Keystone with calcium alginate under 3 layer compression. She states that home health came out twice last week. She has no issues or complaints today. Laura Santiago, Laura Santiago ([id number]) 8/23; patient presents for follow-up. He has been using Keystone with calcium alginate under 3 layer compression. Home health came out twice last week. She denies signs of infection. 8/30; patient presents for follow-up. We have been using Keystone with calcium alginate under 3 layer compression. Home health came out once last  week to change the dressing. Patient reports improvement in wound healing. She states she is almost done with her chemotherapy infusions and has 1 more left. 9/13; patient presents for follow-up. She has lost the capsules to her  [facility] antibiotic which I believe is the vancomycin pills. She has her Zosyn powder today. We have been using Keystone antibiotic ointment with calcium alginate under 3 layer compression. She is concerned about systemic infection however her vitals are stable and there is no surrounding soft tissue infection. She would like to remain a patient in our wound care center however would like a second opinion for her wound care at another facility. She asked to be referred to [facility] wound care center. 9/20; patient presents for follow-up. She found her vancomycin capsules and brought in her complete Keystone antibiotic ointment set today. Unfortunately she has developed skin breakdown and Erythema to the right lower extremity With increased swelling. She states she went to a pow wow Over the weekend and was on her feet for extended periods of time. She saw her oncologist yesterday who prescribed her doxycycline for her right lower extremity erythema. Objective Constitutional Vitals Time Taken: 11:10 AM, Height: 66 in, Weight: 153 lbs, BMI: 24.7, Temperature: 97.8 F, Pulse: 71 bpm, Respiratory Rate: 18 breaths/min, Blood Pressure: 143/67 mmHg. General Notes: Left lower extremity: Large open wound with granulation tissue and nonviable tissue. No signs of surrounding soft tissue infection. Right lower extremity: 2+ pitting edema to the knee. Scattered areas of skin breakdown to the anterior aspect with minimal weeping. Increased erythema although no increased warmth. No purulent drainage. Integumentary (Hair, Skin) Wound #1 status is Open. Original cause of wound was Gradually Appeared. The date acquired was: 04/06/2021. The wound has been in treatment 60 weeks. The wound is located on the Left,Medial Lower Leg. The wound measures 7cm length x 12.6cm width x 0.2cm depth; 69.272cm^2 area and 13.854cm^3 volume. There is Fat Layer (Subcutaneous Tissue) exposed. There is a medium amount of serosanguineous  drainage noted. The wound margin is flat and intact. There is medium (34-66%) red, pink granulation within the wound bed. There is a medium (34-66%) amount of necrotic tissue within the wound bed including Adherent Slough. Wound #7 status is Open. Original cause of wound was Not Known. The date acquired was: 08/28/2022. The wound is located on the Right,Anterior Lower Leg. The wound measures 8cm length x 5.5cm width x 1cm depth; 34.558cm^2 area and 34.558cm^3 volume. There is no tunneling or undermining noted. There is a medium amount of serosanguineous drainage noted. The wound margin is flat and intact. There is no granulation within the wound bed. Assessment Active Problems ICD-10 Non-pressure chronic ulcer of other part of left lower leg with fat layer exposed Chronic venous hypertension (idiopathic) with ulcer of left lower extremity Chronic venous hypertension (idiopathic) with ulcer of right lower extremity Venous insufficiency (chronic) (peripheral) Long term (current) use of anticoagulants Essential (primary) hypertension Secondary malignant neoplasm of breast Patient's left lower extremity wounds have shown improvement in size and appearance since last clinic visit. I debrided nonviable tissue and recommended continuing the course with [facility] antibiotic and Aquacel under 3 layer compression. To the right lower extremity she has developed scattered open wounds due to increased swelling from being on her feet for long periods of time in the setting of venous insufficiency. At this time I recommended [facility] antibiotic ointment here along with Aquacel under 3 layer compression. I recommended she continue her doxycycline. Follow-up in 1 week. She knows  to call with any questions or concerns. Laura Santiago, Laura Santiago ([id number]) Procedures Wound #1 Pre-procedure diagnosis of Wound #1 is a Venous Leg Ulcer located on the Left,Medial Lower Leg .Severity of Tissue Pre Debridement is: Fat  layer exposed. There was a Excisional Skin/Subcutaneous Tissue Debridement with a total area of 88.2 sq cm performed by Kalman Shan, MD. With the following instrument(s): Curette to remove Viable and Non-Viable tissue/material. Material removed includes Subcutaneous Tissue and Slough and. A time out was conducted at 11:37, prior to the start of the procedure. A Minimum amount of bleeding was controlled with Pressure. The procedure was tolerated well. Post Debridement Measurements: 7cm length x 12.6cm width x 0.2cm depth; 13.854cm^3 volume. Character of Wound/Ulcer Post Debridement is stable. Severity of Tissue Post Debridement is: Fat layer exposed. 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 There was a Three Layer Compression Therapy Procedure with a pre-treatment ABI of 1.5 by [name]. Post procedure Diagnosis Wound #1: Same as Pre-Procedure Wound #7 Pre-procedure diagnosis of Wound #7 is a Cellulitis located on the Right,Anterior Lower Leg . There was a Three Layer Compression Therapy Procedure by [name]. Post procedure Diagnosis Wound #7: Same as Pre-Procedure Plan Follow-up Appointments: Return Appointment in 1 week. Nurse Visit as needed Home Health: [place]: - [place] for wound care. May utilize formulary equivalent dressing for wound treatment orders unless otherwise specified. Home Health Nurse may visit PRN to address patient s wound care needs. **Please direct any NON-WOUND related issues/requests for orders to patient's Primary Care Physician. **If current dressing causes regression in wound condition, may D/C ordered dressing product/s and apply Normal Saline Moist Dressing daily until next [place] or Other MD appointment. **Notify Wound Healing Center of regression in wound condition at  [phone]. Other Home Health Orders/Instructions: - Dressing change 3 x weekly, twice by home health and once at wound clinic weekly. PLEASE make sure frequency between dressing changes is appropriate. Bathing/ Shower/ Hygiene: May shower with wound dressing protected with water repellent cover or cast protector. No tub bath. Anesthetic (Use 'Patient Medications' Section for Anesthetic Order Entry): Lidocaine applied to wound bed Edema Control - Lymphedema / Segmental Compressive Device / Other: Optional: One layer of unna paste to top of compression wrap (to act as an anchor). - PLEASE when applying wrap start from toes and go up to just below knee Elevate, Exercise Daily and Avoid Standing for Long Periods of Time. Elevate legs to the level of the heart and pump ankles as often as possible Elevate leg(s) parallel to the floor when sitting. DO YOUR BEST to sleep in the bed at night. DO NOT sleep in your recliner. Long hours of sitting in a recliner leads to swelling of the legs and/or potential wounds on your backside. Additional Orders / Instructions: Follow Nutritious Diet and Increase Protein Intake WOUND #1: - Lower Leg Wound Laterality: Left, Medial Cleanser: Wound Cleanser (Home Health) 3 x Per Week/30 Days Discharge Instructions: Wash your hands with soap and water. Remove old dressing, discard into plastic bag and place into trash. Cleanse the wound with Wound Cleanser prior to applying a clean dressing using gauze sponges, not tissues or cotton balls. Do not scrub or use excessive force. Pat dry using gauze sponges, not tissue or cotton balls. Peri-Wound Care: Nystatin Cream USP 30 (g) 3 x Per Week/30 Days Discharge Instructions: Use  Nystatin Cream as directed. MIx 1:1 with TCA cream and applied to peri wound Peri-Wound Care: Triamcinolone Acetonide Cream, 0.1%, 15 (g) tube 3 x Per Week/30 Days Discharge Instructions: Apply as directed. mix 1:1 with nystatin cream for peri  wound Primary Dressing: Aquacel Extra Hydrofiber Dressing, 4x5 (in/in) 3 x Per Week/30 Days Primary Dressing: keystone compound 3 x Per Week/30 Days Discharge Instructions: apply to wound bed as directed on medication label PLEASE follow direction on pill bottle for proper mixing Secondary Dressing: Zetuvit Absorbent Pad, 4x8 (in/in) 3 x Per Week/30 Days Compression Wrap: 3-LAYER WRAP - Profore Lite LF 3 Multilayer Compression Bandaging System 3 x Per Week/30 Days Discharge Instructions: Apply 3 multi-layer wrap as prescribed. WOUND #7: - Lower Leg Wound Laterality: Right, Anterior Cleanser: Wound Cleanser (Home Health) 3 x Per Week/30 Days Discharge Instructions: Wash your hands with soap and water. Remove old dressing, discard into plastic bag and place into trash. Cleanse the wound with Wound Cleanser prior to applying a clean dressing using gauze sponges, not tissues or cotton balls. Do not scrub or use excessive force. Pat dry using gauze sponges, not tissue or cotton balls. Laura Santiago, Laura Santiago. ([id number]) Peri-Wound Care: Nystatin Cream USP 30 (g) 3 x Per Week/30 Days Discharge Instructions: Use Nystatin Cream as directed. MIx 1:1 with TCA cream and applied to peri wound Peri-Wound Care: Triamcinolone Acetonide Cream, 0.1%, 15 (g) tube 3 x Per Week/30 Days Discharge Instructions: Apply as directed. mix 1:1 with nystatin cream for peri wound Primary Dressing: Aquacel Extra Hydrofiber Dressing, 4x5 (in/in) 3 x Per Week/30 Days Primary Dressing: keystone compound 3 x Per Week/30 Days Discharge Instructions: apply to wound bed as directed on medication label PLEASE follow direction on pill bottle for proper mixing Secondary Dressing: Zetuvit Absorbent Pad, 4x8 (in/in) 3 x Per Week/30 Days Compression Wrap: 3-LAYER WRAP - Profore Lite LF 3 Multilayer Compression Bandaging System 3 x Per Week/30 Days Discharge Instructions: Apply 3 multi-layer wrap as prescribed. 1. In office sharp  debridement 2. Aquacel and Keystone antibiotic under 3 layer compression to the lower extremities bilaterally 3. Follow-up in 1 week 4. Continue doxycycline Electronic Signature(s) Signed: 08/30/2022 12:23:50 PM By: Kalman Shan DO Entered By: Kalman Shan on 08/30/2022 12:10:25 Laura Santiago ([id number]) -------------------------------------------------------------------------------- SuperBill Details Patient Name: Laura Santiago. Date of Service: 08/30/2022 Medical Record Number: [id number] Patient Account Number: [id number] Date of Birth/Sex: 05-23-1946 (76 y.o. F) Treating RN: Carlene Coria Primary Care Provider: [facility], [facility] Other Clinician: [name] Referring Provider: [facility] Treating Provider/Extender: Yaakov Guthrie in Treatment: 60 Diagnosis Coding ICD-10 Codes Code Description [phone] 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 I87.311 Chronic venous hypertension (idiopathic) with ulcer of right lower extremity I87.2 Venous insufficiency (chronic) (peripheral) Z79.01 Long term (current) use of anticoagulants I10 Essential (primary) hypertension 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 Facility Procedures CPT4 Code: 71245809 Description: 98338 - DEB SUBQ TISSUE 20 SQ CM/< Modifier: Quantity: 1 CPT4 Code: Description: ICD-10 Diagnosis Description L97.822 Non-pressure chronic ulcer of other part of left lower leg with fat layer Modifier: exposed Quantity: CPT4 Code: 25053976 Description: 11045 - DEB SUBQ TISS EA ADDL 20CM Modifier: Quantity: 4 CPT4 Code: Description: ICD-10 Diagnosis Description L97.822 Non-pressure chronic ulcer of other part of left lower leg with fat layer Modifier: exposed Quantity: Physician Procedures CPT4 Code: 7341937 Description: 99213 - WC PHYS LEVEL 3 - EST  PT  Modifier: Quantity: 1 CPT4 Code: Description: ICD-10 Diagnosis Description I87.311 Chronic venous hypertension (idiopathic) with ulcer of right lower extremi 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 I10 Essential (primary) hypertension Modifier: ty Quantity: CPT4 Code: 6979480 Description: 11042 - WC PHYS SUBQ TISS 20 SQ CM Modifier: Quantity: 1 CPT4 Code: Description: ICD-10 Diagnosis Description L97.822 Non-pressure chronic ulcer of other part of left lower leg with fat layer Modifier: exposed Quantity: CPT4 Code: 1655374 Description: 82707 - WC PHYS SUBQ TISS EA ADDL 20 CM Modifier: Quantity: 4 CPT4 Code: Description: ICD-10 Diagnosis Description L97.822 Non-pressure chronic ulcer of other part of left lower leg with fat layer Modifier: exposed Quantity: Electronic Signature(s) Signed: 08/30/2022 12:23:50 PM By: Kalman Shan DO Entered By: Kalman Shan on 08/30/2022 12:11:21

## 2022-09-05 ENCOUNTER — Inpatient Hospital Stay (HOSPITAL_BASED_OUTPATIENT_CLINIC_OR_DEPARTMENT_OTHER): Payer: Medicare Other | Admitting: Nurse Practitioner

## 2022-09-05 VITALS — BP 143/74 | HR 76 | Temp 96.8°F | Resp 16

## 2022-09-05 DIAGNOSIS — L03115 Cellulitis of right lower limb: Secondary | ICD-10-CM | POA: Diagnosis not present

## 2022-09-05 DIAGNOSIS — Z5111 Encounter for antineoplastic chemotherapy: Secondary | ICD-10-CM | POA: Diagnosis not present

## 2022-09-05 NOTE — Progress Notes (Signed)
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  She had a screening mammogram in September 2014 which showed 2.1 x 2.3 x 1.8 cm irregular mass in her right breast.  It was ER 10% positive PR negative and HER2 positive +3.  She received neoadjuvant chemotherapy with Taxol Herceptin and Perjeta for 4 cycles followed by dose dense AC/Herceptin x4 which she completed in March 2015.  She had a right lumpectomy on 04/03/2014 which showed scant residual invasive ductal carcinoma [id number].  She completed 1 year of adjuvant Herceptin chemotherapy and also completed adjuvant radiation treatment.  She was recommended anastrozole which she took on and off starting November 2015 and stopped sometime in 2020.   She was then hospitalized with neck pain and was found to have lytic lesions involving C5-C6 with pathological vertebral fractures.  She underwent radiation treatment to this area.  Image guided biopsy of the L1 vertebral body showed metastatic carcinoma consistent with breast origin ER 10% PR 0% and HER2 amplified ratio 5.1 average HER2 signal number per cell 15.0 average CEP 17 signals number per cell 3.0.  Baseline echocardiogram on 05/02/2021 showed a normal EF of 62% she was recommended Taxol Herceptin and Perjeta which she received for 2 cycles  at [facility] until June 10, 2021   She has chronic pain from her bone metastases for which she is currently on oxycodone 10 mg every 4 hours as needed and 12 mcg fentanyl patch.  She was seeing pain clinic when she was living in [name].   Patient has also been on Zometa when she was in [name] but she does have some ongoing dental issues.  She has received Xgeva in the past as well.  Her last Delton See was in July 2021.  Last PET scan was on 03/21/2021 which showed diffuse osseous metastatic disease involving the head neck chest abdomen and pelvis and spine.  Left lung apex hypermetabolic nodule and multiple hypermetabolic liver lesions concerning for disease involvement.  Enlarged hypermetabolic left inguinal lymph nodes along with hypermetabolic external iliac and left supraclavicular lymph nodes   Patient is now moved to [place] to be close to her daughter.  She lives in an independent living.  Patient received Taxol Herceptin and Perjeta for about a year and is presently on maintenance Herceptin and Perjeta  Interval history-patient is 76 year old female with above history of breast cancer who returns to clinic for follow-up for right lower extremity cellulitis.  She has been seen by wound care and is receiving treatment.  Reports compliance with doxycycline and takes her last dose tomorrow.  Also seems wound care MD, Dr. Heber [place] tomorrow to reevaluate.  ECOG PS- 2 Pain scale- 5 Opioid associated constipation- no  Review of systems- Review of Systems  Constitutional:  Positive for malaise/fatigue. Negative for chills, fever and weight loss.  HENT:  Negative for hearing loss, nosebleeds, sore throat and tinnitus.  Eyes:  Negative for blurred vision and double vision.  Respiratory:  Negative for cough, hemoptysis, shortness of breath and wheezing.   Cardiovascular:  Negative for chest pain, palpitations and leg swelling.  Gastrointestinal:  Negative for abdominal pain, blood in stool, constipation,  diarrhea, melena, nausea and vomiting.  Genitourinary:  Negative for dysuria and urgency.  Musculoskeletal:  Negative for back pain, falls, joint pain and myalgias.  Skin:  Negative for itching and rash.       Wound per hpi  Neurological:  Negative for dizziness, tingling, sensory change, loss of consciousness, weakness and headaches.  Endo/Heme/Allergies:  Negative for environmental allergies. Does not bruise/bleed easily.  Psychiatric/Behavioral:  Negative for depression. The patient is not nervous/anxious and does not have insomnia.       Allergies  Allergen Reactions   Other Diarrhea and Nausea And Vomiting    Pt has had episodes of nausea, vomiting and diarrhea right after receiving IV technetium for NM study on two occasions.   Technetium Tc 69mMedronate Diarrhea and Nausea And Vomiting    Pt has had episodes of nausea, vomiting and diarrhea right after receiving IV technetium for NM study on two occasions.    Corticosteroids Other (See Comments)    [place]. Notes that it causes agitation intolerance   Sulfa Antibiotics Other (See Comments)    [place] has allergy but we do not know reactions when taking the drug   Celebrex [Celecoxib] Rash     Past Medical History:  Diagnosis Date   ADHD (attention deficit hyperactivity disorder)    [place] note   Anemia    IDA 11/26/2019, Anemia in stage 4 chronic kidney disease ([place] 09/03/2020   Arthritis    osteoarthritis right knee 09/30/2014   [place] 2 [place] 3[place]   Cancer related pain 03/28/2021   [place]   cervical compression fracture [place]18/2021   [place]   [place] of [place] extremity, bilateral [place] 03/30[place]2014   [place]  Generalized muscle weakness 03/31/2016   [place]  GERD (gastroesophageal reflux disease) 05/15/2013   [place]  [place]roidism, secondary  ([place] 04/25/2018   [place]  Hypertension 02/20/2016   [place]   [place]  [place] 048[place]18/5631  [place]  Metabolic syndrome 049/70/2637  [place]  Osteopenia after menopause 03/29/2016   [place]  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   [place]  Stasis ulcer of left lower extremity ([place] 03/29/2016   [place]    Past Surgical History:  Procedure Laterality Date   CESAREAN SECTION     unknown   fibroid removed  N/A    [place]   IR FLUORO GUIDE CV LINE LEFT  07/27/2021   IR PORT REPAIR CENTRAL VENOUS ACCESS DEVICE Left    [place]      unknown   PORTA CATH INSERTION N/A 02/13/2022   Procedure: PORTA CATH INSERTION;  Surgeon: DAlgernon Huxley MD;  [facility];  Service: Cardiovascular;  Laterality: N/A;   R[place]ounCATARACT EXTRACAP,INSERT LENS  Bilateral  Bilateral 09/05/2012   in [place]     unknown    LUMPECTOMY Right 04/03/2014   in [place]    Social History   Socioeconomic History   Marital status: Divorced    Spouse name: Not on file   Number of children: Not on file   Years of education: Not on file   Highest education level: Not on file  Occupational History   Occupation: retired Teacher, music    Comment: In [place]  Tobacco Use   Smoking status: Never   Smokeless tobacco: Never  Vaping Use   Vaping Use: Never used  Substance and Sexual Activity   Alcohol use: Not Currently   Drug use: Not Currently   Sexual activity: Not Currently  Other Topics Concern   Not on file  Social History Narrative   Not on file   Social Determinants of Health   Financial Resource Strain: Low Risk  (03/17/2022)   Overall Financial Resource Strain (CARDIA)    Difficulty of Paying Living Expenses: Not hard at all  Food Insecurity: No Food Insecurity (03/17/2022)   Hunger Vital Sign    Worried About Running Out of Food  in the Last Year: Never true    Ran Out of Food in the Last Year: Never true  Transportation Needs: No Transportation Needs (03/17/2022)   PRAPARE - Hydrologist (Medical): No    Lack of Transportation (Non-Medical): No  Physical Activity: Inactive (03/17/2022)   Exercise Vital Sign    Days of Exercise per Week: 0 days    Minutes of Exercise per Session: 0 min  Stress: No Stress Concern Present (03/17/2022)   [place]    Feeling of Stress : Only a little  Social Connections: Socially Isolated (03/17/2022)   Social Connection and Isolation Panel [NHANES]    Frequency of Communication with Friends and Family: Twice a week    Frequency of Social Gatherings with Friends and Family: Twice a week    Attends Religious Services: Never    Marine scientist or Organizations: No    Attends Archivist Meetings: Never    Marital Status: Widowed  Intimate Partner Violence: Not At Risk (03/17/2022)   Humiliation, Afraid, Rape, and Kick questionnaire    Fear of Current or Ex-Partner: No    Emotionally Abused: No    Physically Abused: No    Sexually Abused: No    Family History  Problem Relation Age of Onset   Pancreatic cancer Mother    Stroke Father    Diabetes Father    Hypertension Father    Heart disease Father    Skin cancer Father    Varicose Veins Father    Skin cancer Brother    Cancer - Prostate Brother      Current Outpatient Medications:    acetaminophen (TYLENOL) 500 MG tablet, Take 500 mg by mouth every 4 (four) hours as needed., Disp: , Rfl:    amphetamine-dextroamphetamine (ADDERALL XR) 30 MG 24 hr capsule, Take 1 capsule (30 mg total) by mouth daily., Disp: 30 capsule, Rfl: 0   calcium citrate (CALCITRATE - DOSED IN MG ELEMENTAL CALCIUM) 950 (200 Ca) MG tablet, Take 200 mg of elemental calcium by mouth daily., Disp: , Rfl:    doxycycline (VIBRA-TABS) 100 MG tablet, Take  1 tablet (100 mg total) by mouth 2 (two) times daily., Disp: 14 tablet, Rfl: 0  DULoxetine (CYMBALTA) 30 MG capsule, Take by mouth. (Patient not taking: Reported on 06/06/2022), Disp: , Rfl:    fentaNYL (DURAGESIC) 25 MCG/HR, PLACE 1 PATCH ONTO THE SKIN EVERY THREE DAYS AS DIRECTED., Disp: 10 patch, Rfl: 0   gabapentin (NEURONTIN) 100 MG capsule, Take 1 capsule (100 mg total) by mouth 3 (three) times daily., Disp: 90 capsule, Rfl: 1   gentamicin cream (GARAMYCIN) 0.1 %, SMARTSIG:1 Application Topical (Patient not taking: Reported on 03/14/2022), Disp: , Rfl:    hydrOXYzine (ATARAX) 25 MG tablet, Take by mouth. (Patient not taking: Reported on 08/15/2022), Disp: , Rfl:    lisinopril (ZESTRIL) 20 MG tablet, Take 1 tablet (20 mg total) by mouth daily., Disp: 30 tablet, Rfl: 3   LORazepam (ATIVAN) 0.5 MG tablet, Take 1 tablet (0.5 mg total) by mouth daily as needed for anxiety., Disp: 30 tablet, Rfl: 0   Multiple Vitamin (MULTI-VITAMIN) tablet, Take 1 tablet by mouth daily., Disp: , Rfl:    naloxone (NARCAN) nasal spray 4 mg/0.1 mL, SPRAY 1 SPRAY INTO ONE NOSTRIL AS DIRECTED FOR OPIOID OVERDOSE (TURN PERSON ON SIDE AFTER DOSE. IF NO RESPONSE IN 2-3 MINUTES OR PERSON RESPONDS BUT RELAPSES, REPEAT USING A NEW SPRAY DEVICE AND SPRAY INTO THE OTHER NOSTRIL. CALL 911 AFTER USE.) * EMERGENCY USE ONLY * (Patient not taking: Reported on 08/29/2022), Disp: 1 each, Rfl: 0   NON FORMULARY, Apply 1 Dose topically daily. Pt mixes up piperacillim and tazobactam and puts it on the wounds of left leg, Disp: , Rfl:    ondansetron (ZOFRAN) 8 MG tablet, Take 1 tablet (8 mg total) by mouth as needed for nausea or vomiting. (Patient not taking: Reported on 08/29/2022), Disp: 30 tablet, Rfl: 0   Oxycodone HCl 10 MG TABS, Take 1.5 tablets (15 mg total) by mouth every 6 (six) hours as needed., Disp: 90 tablet, Rfl: 0   rivaroxaban (XARELTO) 20 MG TABS tablet, Take 1 tablet (20 mg total) by mouth daily with supper., Disp: 30 tablet, Rfl:  3   sodium hypochlorite (DAKIN'S 1/4 STRENGTH) 0.125 % SOLN, Apply 1 application topically as directed. Moisten gauze with solution and wrap wound, Disp: , Rfl:  No current facility-administered medications for this visit.  Facility-Administered Medications Ordered in Other Visits:    heparin lock flush 100 UNIT/ML injection, , , ,   Physical exam:  Vitals:   09/05/22 1030  BP: (!) 143/74  Pulse: 76  Resp: 16  Temp: (!) 96.8 F (36 C)  TempSrc: Tympanic  SpO2: 100%   Physical Exam Cardiovascular:     Rate and Rhythm: Normal rate and regular rhythm.     Heart sounds: Normal heart sounds.  Pulmonary:     Effort: Pulmonary effort is normal.     Breath sounds: Normal breath sounds.  Abdominal:     General: Bowel sounds are normal.     Palpations: Abdomen is soft.  Musculoskeletal:     Comments: Dressing in place over left lower extremity secondary to chronic ulcerated wound.  There is new redness and swelling over the right lower extremity consistent with cellulitis  Skin:    General: Skin is warm and dry.  Neurological:     Mental Status: She is alert and oriented to person, place, and time.        Latest Ref Rng & Units 08/29/2022    8:34 AM  CMP  Glucose 70 - 99 mg/dL 120   BUN 8 - 23 mg/dL 33   Creatinine 0.44 -  1.00 mg/dL 1.67   Sodium 135 - 145 mmol/L 137   Potassium 3.5 - 5.1 mmol/L 4.1   Chloride 98 - 111 mmol/L 106   CO2 22 - 32 mmol/L 26   Calcium 8.9 - 10.3 mg/dL 8.5   Total Protein 6.5 - 8.1 g/dL 8.0   Total Bilirubin 0.3 - 1.2 mg/dL 0.5   Alkaline Phos 38 - 126 U/L 76   AST 15 - 41 U/L 21   ALT 0 - 44 U/L 12       Latest Ref Rng & Units 08/29/2022    8:34 AM  CBC  WBC 4.0 - 10.5 K/uL 6.7   Hemoglobin 12.0 - 15.0 g/dL 10.3   Hematocrit 36.0 - 46.0 % 33.4   Platelets 150 - 400 K/uL 264      CT CHEST ABDOMEN PELVIS W CONTRAST  Result Date: 08/25/2022 CLINICAL DATA:  Breast cancer, stage IV assess treatment response EXAM: CT CHEST, ABDOMEN, AND  PELVIS WITH CONTRAST TECHNIQUE: Multidetector CT imaging of the chest, abdomen and pelvis was performed following the standard protocol during bolus administration of intravenous contrast. RADIATION DOSE REDUCTION: This exam was performed according to the departmental dose-optimization program which includes automated exposure control, adjustment of the mA and/or kV according to patient size and/or use of iterative reconstruction technique. body onc CONTRAST:  45m OMNIPAQUE IOHEXOL 300 MG/ML  SOLN COMPARISON:  05/04/2022 FINDINGS: CT CHEST FINDINGS Cardiovascular: Heart size is upper limits of normal. No pericardial effusion. Mild aortic atherosclerosis. Again noted are superficial venous collateralization within the chest and lower neck which is suggestive of central venous stenosis in the setting of chronic central venous catheter access. Mediastinum/Nodes: Thyroid gland, trachea, and esophagus are unremarkable. No enlarged axillary, supraclavicular, mediastinal, or hilar lymph nodes. Lungs/Pleura: There is no pleural effusion. No airspace consolidation identified. Subpleural fibrotic changes within the anterior right upper lobe compatible with changes due to external beam radiation. Stable nodule within the medial right lower lobe measuring 4 mm, image 62/4. This is compatible with a benign nodule. No new or suspicious lung nodules. Musculoskeletal: Diffuse sclerotic bone metastases are again noted. These appear unchanged when compared with the prior exam. Status post right lumpectomy with overlying skin thickening. Right axillary node dissection. CT ABDOMEN PELVIS FINDINGS Hepatobiliary: No suspicious liver abnormality. Gallbladder appears unremarkable. Stable mild increase caliber of the common bile duct which measures up to 1 cm. No signs of choledocholithiasis or obstructing mass. Pancreas: Unremarkable. No pancreatic ductal dilatation or surrounding inflammatory changes. Spleen: Normal in size without focal  abnormality. Adrenals/Urinary Tract: Normal right adrenal gland. Stable thickening of the left adrenal gland. Bilateral renal cortical scarring. No nephrolithiasis, hydronephrosis or mass. Urinary bladder is unremarkable. Stomach/Bowel: Stomach appears normal. Moderate stool burden identified within the colon. No signs of bowel wall thickening, inflammation, or distension. Vascular/Lymphatic: Aortic atherosclerosis. No aneurysm. No abdominal adenopathy. Again noted is bilateral iliac, pelvic sidewall, and inguinal adenopathy. Index left pelvic sidewall lymph node measures 1.3 cm, image 89/2. Previously 1.5 cm. Index left inguinal lymph node measures 1.5 cm, image 108/2. Formally 1.7 cm. Index right inguinal lymph node measures 1.1 cm, image 102/2. Previously 1 cm. Reproductive: No mass identified. Other: No significant ascites.  No signs of peritoneal nodularity. Musculoskeletal: Multifocal sclerotic bone metastases are again noted and appear unchanged in the interval. IMPRESSION: 1. No significant change in the appearance of diffuse sclerotic bone metastases involving the axial and proximal appendicular skeleton. 2. No significant change in the appearance of enlarged bilateral pelvic  and inguinal adenopathy. 3. Moderate stool burden is noted throughout the colon concerning for constipation. 4. Similar appearance of chest wall collaterals which is suggestive of central venous stenosis in the setting of chronic indwelling catheter access. 5. Aortic Atherosclerosis (ICD10-I70.0). Electronically Signed   By: Kerby Moors M.D.   On: 08/25/2022 21:13     Assessment and plan- Patient is a 76 y.o. female with metastatic breast cancer currently receiving maintenance herceptin and perjeta who presents to clinic for follow up of RLE cellulitis.   Cellulitis- on doxycycline 100 mg BID x 7 days. Improving. She is now being followed by Dr Heber [place], wound care. Unna boots in place today. Continue doxycycline as prescribed.  Follow up with Dr Heber  tomorrow.   She can follow up with Dr. Janese Banks for continuation of herceptin and perjeta on 10/10  Visit Diagnosis No diagnosis found.  Beckey Rutter, DNP, AGNP-C [place] at [facility] [phone] (clinic) 09/05/2022

## 2022-09-05 NOTE — Progress Notes (Signed)
Pt returns for follow-up of cellulitis to RLE. Per pt, she has one more day left of antibiotics, and reports that the area is looking much better.

## 2022-09-06 ENCOUNTER — Encounter (HOSPITAL_BASED_OUTPATIENT_CLINIC_OR_DEPARTMENT_OTHER): Payer: Medicare Other | Admitting: Internal Medicine

## 2022-09-06 DIAGNOSIS — L97822 Non-pressure chronic ulcer of other part of left lower leg with fat layer exposed: Secondary | ICD-10-CM | POA: Diagnosis not present

## 2022-09-08 ENCOUNTER — Encounter: Payer: Self-pay | Admitting: Oncology

## 2022-09-08 ENCOUNTER — Other Ambulatory Visit: Payer: Self-pay | Admitting: Oncology

## 2022-09-08 NOTE — Progress Notes (Signed)
SHANETTE, TAMARGO ([id number]) Visit Report for 09/06/2022 Chief Complaint Document Details Patient Name: Laura Santiago, Laura Santiago. Date of Service: 09/06/2022 10:30 AM Medical Record Number: [id number] Patient Account Number: [id number] Date of Birth/Sex: Mar 20, 1946 (75 y.o. F) Treating RN: Cornell Barman Primary Care Provider: Hilda Lias, [place] Other Clinician: [name] Referring Provider: [facility] Treating Provider/Extender: Yaakov Guthrie in Treatment: 72 Information Obtained from: Patient Chief Complaint Left lower extremity wound Right toe wounds Left upper lateral thigh wounds Electronic Signature(s) Signed: 09/06/2022 12:26:22 PM By: Kalman Shan DO Entered By: Kalman Shan on 09/06/2022 11:57:56 Laura Santiago ([id number]) -------------------------------------------------------------------------------- Debridement Details Patient Name: Laura Santiago. Date of Service: 09/06/2022 10:30 AM Medical Record Number: [id number] Patient Account Number: [id number] Date of Birth/Sex: December 08, 1946 (76 y.o. F) Treating RN: Cornell Barman Primary Care Provider: Hilda Lias, [place] Other Clinician: [name] Referring Provider: [facility] Lias, [place] Treating Provider/Extender: Yaakov Guthrie in Treatment: 61 Debridement Performed for Wound #1 Left,Medial Lower Leg Assessment: Performed By: Physician Kalman Shan, MD Debridement Type: Debridement Severity of Tissue Pre Debridement: Fat layer exposed Level of Consciousness (Pre- Awake and Alert procedure): Pre-procedure Verification/Time Out Yes - 11:17 Taken: Start Time: 11:17 Total Area Debrided (L x W): 7.5 (cm) x 11 (cm) = 82.5 (cm) Tissue and other material Viable, Non-Viable, Slough, Subcutaneous, Slough debrided: Level: Skin/Subcutaneous Tissue Debridement Description: Excisional Instrument: Curette Bleeding: Minimum Hemostasis Achieved: Pressure End Time: 11:20 Response to  Treatment: Procedure was tolerated well Level of Consciousness (Post- Awake and Alert procedure): Post Debridement Measurements of Total Wound Length: (cm) 7.5 Width: (cm) 11 Depth: (cm) 0.3 Volume: (cm) 19.439 Character of Wound/Ulcer Post Debridement: Stable Severity of Tissue Post Debridement: Fat layer exposed Post Procedure Diagnosis Same as Pre-procedure Electronic Signature(s) Signed: 09/06/2022 12:26:22 PM By: Kalman Shan DO Signed: 09/06/2022 5:32:28 PM By: Gretta Cool, BSN, RN, CWS, [name] RN, BSN Signed: 09/08/2022 12:10:36 PM By: [name] Entered By: [name] on 09/06/2022 11:19:37 Laura Santiago ([id number]) -------------------------------------------------------------------------------- HPI Details Patient Name: Laura, Santiago. Date of Service: 09/06/2022 10:30 AM Medical Record Number: [id number] Patient Account Number: [id number] Date of Birth/Sex: 20-Jun-1946 (76 y.o. F) Treating RN: Cornell Barman Primary Care Provider: Hilda Lias, [place] Other Clinician: [name] Referring Provider: [facility] Treating Provider/Extender: Yaakov Guthrie in Treatment: 54 History of Present Illness HPI Description: Admission 7/27 Ms. Laura Santiago is a 76 year old female with a past medical history of ADHD, metastatic breast cancer, stage IV chronic kidney disease, history of DVT on Xarelto and chronic venous insufficiency that presents to the clinic for a chronic left lower extremity wound. She recently moved to [facility] 4 days ago. She was being followed by wound care center in [name]. She reports a 10-year history of wounds to her left lower extremity that eventually do heal with debridement and compression therapy. She states that the current wound reopened 4 months ago and she is using Vaseline and Coban. She denies signs of infection. 8/3; patient presents for 1 week follow-up. She reports no issues or complaints today. She  states she had vascular studies done in the last week. She denies signs of infection. She brought her little service dog with her today. 8/17; patient presents for follow-up. She has missed her last clinic appointment. She states she took the wrap off and attempted to rewrap her leg. She is having difficulty with transportation. She has her service dog with her today. Overall she feels well and reports improvement in wound healing. She denies signs  of infection. She reports owning an old Velcro wrap compression and has this at her living facility 9/14; patient presents for follow-up. Patient states that over the past 2 to 3 weeks she developed toe wounds to her right foot. She attributes this to tight fitting shoes. She subsequently developed cellulitis in the right leg and has been treated by doxycycline by her oncologist. She reports improvement in symptoms however continues to have some redness and swelling to this leg. To the left lower extremity patient has been having her wraps changed with home health twice weekly. She states that the [facility] is not helping control the drainage. Other than that she has no issues or complaints today. She denies signs of infection to the left lower extremity. 9/21; patient presents for follow-up. She reports seeing infectious disease for her cellulitis. She reports no further management. She has home health that changes the wraps twice weekly. She has no issues or complaints today. She denies signs of infection. 10/5; patient presents for follow-up. She has no issues or complaints today. She denies signs of infection. She states that the right great toe has not been dressed by home health. 10/12; patient presents for follow-up. She has no issues or complaints today. She reports improvement in her wound healing. She has been using silver alginate to the right great toe wound. She denies signs of infection. 10/26; patient presents for follow-up. Home health  did not have sorbact so they continued to use Hydrofera Blue under the wrap. She has been using silver alginate to the great toe wound however she did not have a dressing in place today. She currently denies signs of infection. 11/2; patient presents for follow-up. She has been using sorb act under the compression wrap. She reports using silver alginate to the toe wound again she does not have a dressing in place. She currently denies signs of infection. 11/23; patient presents for follow-up. Unfortunately she has missed her last 2 clinic appointments. She was last seen 3 weeks ago. She did her own compression wrap with Kerlix and Coban yesterday after seeing vein and vascular. She has not been dressing her right great toe wound. She currently denies signs of infection. 11/30; patient presents for 1 week follow-up. She states she changed her dressing last week prior to home health and use sorb act with Dakin's and Hydrofera Blue. Home health has changed the dressing as well and they have been using sorbact. Today she reports increased redness to her right lower extremity. She has a history of cellulitis to this leg. She has been using silver alginate to the right great toe. Unfortunately she had an episode of diarrhea prior to coming in and had feces all over the right leg and to the wrap of her left leg. 12/7; patient presents for 1 week follow-up. She states that home health did not come out to change the dressing and she took it off yesterday. It is unclear if she is dressing the right toe wound. She denies signs of infection. 12/14; patient presents for 1 week follow-up. She has no issues or complaints today. 12/21; patient presents for follow-up. She has no issues or complaints today. She denies signs of infection. 12/28/2021; patient presents for follow-up. She was hospitalized for sepsis secondary to right lower extremity cellulitis On 12/23. She states she is currently at a SNF. She states  that she was started on doxycycline this morning for her right great toe swelling and redness. She is not sure what dressings  have been done to her left lower extremity for the past 3 weeks. She says its been mainly gauze with an Ace wrap. 1/25; patient presents for follow-up. She is still residing in a skilled nursing facility. She reports mild pain to the left lower extremity wound bed. She states she is going to see a podiatrist soon. 2/8; patient presents for follow-up. She has moved back to her residential community from her skilled nursing facility. She has no issues or complaints today. She denies signs of systemic infections. 2/15; patient presents for follow-up. He has no issues or complaints today. She denies systemic signs of infection. 2/22; patient presents for follow-up. She has no issues or complaints today. She denies signs of infection. ELENI, FRANK ([id number]) 3/1; patient presents for follow-up. She states that home health came out the day after she was seen in our clinic and yesterday to do the wrap change. She denies signs of infection. She reports excoriated skin on the ankle. 3/8; patient presents for follow-up. She has no issues or complaints today. She denies signs of infection. 3/15; patient presents for follow-up. Home health has been coming out to change the dressings. She reports more tenderness to the wound site. She denies purulent drainage, increased warmth or erythema to the area. 4/5; patient presents for follow-up. She has missed her last 2 clinic appointments. I have not seen her in 3 weeks. She was recently hospitalized for altered mental status. She was involuntarily committed. She was evaluated by psychiatry and deemed to have competency. There was no specific cause of her altered mental status. It was concluded that her physical and mental health were declining due to her chronic medical conditions. Currently home health has been coming out for dressing  changes. Patient has also been doing her own dressing changes. She reports more skin breakdown to the periwound and now has a new wound. She denies fever/chills. She reports continued tenderness to the wound site. 4/12; patient with significant venous insufficiency and a large wound on her left lower leg taking up about 80% of the circumference of her lower leg. Cultures of this grew MRSA and Pseudomonas. She had completed a course of ciprofloxacin now is starting doxycycline. She has been using Dakin's wet-to-dry and a Tubigrip. She has home health twice a week and we change it once. 4/19; patient presents for follow-up. She completed her course of doxycycline. She has been using Dakin's wet-to-dry dressing and Tubigrip. Home health changes the dressing twice weekly. Currently she has no issues or complaints. 4/26; patient presents for follow-up. At last clinic visit orders for home health were Iodosorb under compression therapy. Unfortunately they did not have the dressing and have been using Dakin's and gentamicin under the wrap. Patient currently denies signs of infection. She has no issues or complaints today. 5/3; patient presents for follow-up. Again Iodosorb has not been used under the compression therapy when home health comes out to change the wrap and dressing. They have been using Sorbact. It is unclear why this is happening since we send orders weekly to the agency. She denies signs of infection. Patient has not purchased the [place] antibiotics. We reached out to the company and they said they have been trying to contact her on a regular basis. We gave the patient the number to call to order the medication. 5/10; patient presents for follow-up. She has no issues or complaints today. Again home health has not been using Iodosorb. Mepilex was on the wound bed. No other dressings  noted. She brought in her Keystone antibiotics. She denies signs of infection. 5/17; patient presents for  follow-up. Home health has come out twice since she was last seen. Joint well she has been using Keystone antibiotic with Sorbact under the compression wrap. She has no issues or complaints today. She denies signs of infection. 5/24; patient presents for follow-up. We have been using Keystone antibiotics with Sorbact under compression therapy. She is tolerating the treatment well. She is reporting improvement in wound healing. She denies signs of infection. 5/31; patient presents for follow-up. We continue to do [facility] antibiotics with Sorbact under compression therapy. She continues to report improvement in wound healing. Home health comes out and changes the dressing once weekly. 05-17-2022 upon evaluation today patient appears to be doing better in regard to her wound especially compared to the last time I saw her. Fortunately I do think that she is seeing improvements. With that being said I do believe that she may be benefit from sharp debridement today to clear away some of the necrotic debris I discussed that with her as well. She is an amendable to that plan. Otherwise she is very pleased with how the [employer] is doing for her. 6/14; patient presents for follow-up. We have been using Keystone antibiotic with Sorbact and absorbent dressings under 3 layer compression. She has no issues or complaints today. She reports improvement in wound healing. She denies signs of infection. 6/21; patient presents for follow-up. We are continuing with [facility] antibiotic and Sorbact under 3 layer compression. Patient has no complaints. Continued wound healing is happening. She denies signs of infection. 6/28; patient presents for follow-up. We have been using Keystone antibiotic with Sorbact under 3 layer compression. Usually home health comes out and changes the dressing twice a week. Unfortunately they did not go out to change the dressing. It is unclear why. Patient did not call them. She currently denies  signs of infection. 7/5; patient presents for follow-up. We have been using Keystone antibiotic with calcium alginate under 3 layer compression. She reports improvement in wound healing. She denies signs of infection. Home health has come out to do dressing changes twice this past week. 7/12; patient presents for follow-up. We have been using Keystone antibiotic with calcium alginate under 3 layer compression. Patient states that home health came out once last week to change the dressing. She reports improvement in wound healing. She currently denies signs of infection. 7/19; patient presents for follow-up. We have been using Keystone antibiotic with calcium alginate under 3 layer compression. Home health came out once last week to change the dressing. She has no issues or complaints today. She denies signs of infection. 8/2; patient presents for follow-up. We have been using Keystone antibiotic with calcium alginate under 3 layer compression. Unfortunately she missed her appointment last week and home health did not come out to do dressing changes. Patient currently denies signs of infection. 8/9; patient presents for follow-up. We have been using Keystone with calcium alginate under 3 layer compression. She states that home health came out once last week. She currently denies signs of infection. Her wrap was completely wet. She states she was cleaning the top of the leg and water soaked down into the wrap. 8/16; patient presents for follow-up. We have been using Keystone with calcium alginate under 3 layer compression. She states that home health came out twice last week. She has no issues or complaints today. 8/23; patient presents for follow-up. He has been using [place] with  calcium alginate under 3 layer compression. Home health came out twice last week. She denies signs of infection. 8/30; patient presents for follow-up. We have been using Keystone with calcium alginate under 3 layer  compression. Home health came out once ORLANDO, THALMANN. ([id number]) last week to change the dressing. Patient reports improvement in wound healing. She states she is almost done with her chemotherapy infusions and has 1 more left. 9/13; patient presents for follow-up. She has lost the capsules to her [facility] antibiotic which I believe is the vancomycin pills. She has her Zosyn powder today. We have been using Keystone antibiotic ointment with calcium alginate under 3 layer compression. She is concerned about systemic infection however her vitals are stable and there is no surrounding soft tissue infection. She would like to remain a patient in our wound care center however would like a second opinion for her wound care at another facility. She asked to be referred to [facility] wound care center. 9/20; patient presents for follow-up. She found her vancomycin capsules and brought in her complete Keystone antibiotic ointment set today. Unfortunately she has developed skin breakdown and Erythema to the right lower extremity With increased swelling. She states she went to a pow wow Over the weekend and was on her feet for extended periods of time. She saw her oncologist yesterday who prescribed her doxycycline for her right lower extremity erythema. 9/27; patient presents for follow-up. We have been using Keystone antibiotic with Aquacel under 3 layer compression to the lower extremities bilaterally. When home health came and changed the wrap she secretly put coffee into the spray mix along with [facility] antibiotic on her leg thinking the acidic component would better activate the zoysn (sonething she discussed with her microbiologist brother). She has reported improvement in wound healing. Electronic Signature(s) Signed: 09/06/2022 12:26:22 PM By: Kalman Shan DO Entered By: Kalman Shan on 09/06/2022 12:03:26 Laura Santiago  ([id number]) -------------------------------------------------------------------------------- Physical Exam Details Patient Name: MINDY, GALI. Date of Service: 09/06/2022 10:30 AM Medical Record Number: [id number] Patient Account Number: [id number] Date of Birth/Sex: 30-Oct-1946 (76 y.o. F) Treating RN: Cornell Barman Primary Care Provider: Hilda Lias, [place] Other Clinician: [name] Referring Provider: [facility] Treating Provider/Extender: Yaakov Guthrie in Treatment: 31 Constitutional . Cardiovascular . Psychiatric . Notes Left lower extremity: Open wound with granulation tissue and nonviable tissue. No signs of surrounding soft tissue infection. Right lower extremity: Few scattered areas of skin breakdown to the anterior aspect. Good edema control. No signs of surrounding soft tissue infection. Electronic Signature(s) Signed: 09/06/2022 12:26:22 PM By: Kalman Shan DO Entered By: Kalman Shan on 09/06/2022 12:05:14 Laura Santiago ([id number]) -------------------------------------------------------------------------------- Physician Orders Details Patient Name: Laura Santiago. Date of Service: 09/06/2022 10:30 AM Medical Record Number: [id number] Patient Account Number: [id number] Date of Birth/Sex: 03/19/1946 (76 y.o. F) Treating RN: Cornell Barman Primary Care Provider: Hilda Lias, [place] Other Clinician: [name] Referring Provider: [facility] Treating Provider/Extender: Yaakov Guthrie in Treatment: 35 Verbal / Phone Orders: No Diagnosis Coding Follow-up Appointments o Return Appointment in 1 week. o Nurse Visit as needed [place] for wound care. May utilize formulary equivalent dressing for wound treatment orders unless otherwise specified. Home Health Nurse may visit PRN to address patientos wound care needs. o **Please direct any  NON-WOUND related issues/requests for orders to patient's Primary Care Physician. **If current dressing causes regression in wound condition, may D/C ordered dressing product/s and  apply Normal Saline Moist Dressing daily until next [facility] or Other MD appointment. **Notify Wound Healing Center of regression in wound condition at [phone]. o Other Home Health Orders/Instructions: - Dressing change 3 x weekly, twice by home health and once at wound clinic weekly. PLEASE make sure frequency between dressing changes is appropriate. Bathing/ Shower/ Hygiene o May shower with wound dressing protected with water repellent cover or cast protector. o No tub bath. Anesthetic (Use 'Patient Medications' Section for Anesthetic Order Entry) o Lidocaine applied to wound bed Edema Control - Lymphedema / Segmental Compressive Device / Other o Optional: One layer of unna paste to top of compression wrap (to act as an anchor). - PLEASE when applying wrap start from toes and go up to just below knee o Elevate, Exercise Daily and Avoid Standing for Long Periods of Time. o Elevate legs to the level of the heart and pump ankles as often as possible o Elevate leg(s) parallel to the floor when sitting. o DO YOUR BEST to sleep in the bed at night. DO NOT sleep in your recliner. Long hours of sitting in a recliner leads to swelling of the legs and/or potential wounds on your backside. Additional Orders / Instructions o Follow Nutritious Diet and Increase Protein Intake Medications-Please add to medication list. o Keystone Compound Wound Treatment Wound #1 - Lower Leg Wound Laterality: Left, Medial Cleanser: Wound Cleanser (Home Health) 3 x Per Week/30 Days Discharge Instructions: Wash your hands with soap and water. Remove old dressing, discard into plastic bag and place into trash. Cleanse the wound with Wound Cleanser prior to applying a clean dressing using gauze sponges,  not tissues or cotton balls. Do not scrub or use excessive force. Pat dry using gauze sponges, not tissue or cotton balls. Peri-Wound Care: Nystatin Cream USP 30 (g) 3 x Per Week/30 Days Discharge Instructions: Use Nystatin Cream as directed. MIx 1:1 with TCA cream and applied to peri wound Peri-Wound Care: Triamcinolone Acetonide Cream, 0.1%, 15 (g) tube 3 x Per Week/30 Days Discharge Instructions: Apply as directed. mix 1:1 with nystatin cream for peri wound Primary Dressing: Aquacel Extra Hydrofiber Dressing, 4x5 (in/in) 3 x Per Week/30 Days Primary Dressing: keystone compound 3 x Per Week/30 Days Discharge Instructions: apply to wound bed as directed on medication label PLEASE follow direction on pill bottle for proper mixing Secondary Dressing: Zetuvit Absorbent Pad, 4x8 (in/in) 3 x Per Week/30 Days GREENLEIGH, KAUTH. ([id number]) Compression Wrap: 3-LAYER WRAP - Profore Lite LF 3 Multilayer Compression Bandaging System 3 x Per Week/30 Days Discharge Instructions: Apply 3 multi-layer wrap as prescribed. Wound #7 - Lower Leg Wound Laterality: Right, Anterior Cleanser: Wound Cleanser (Home Health) 3 x Per Week/30 Days Discharge Instructions: Wash your hands with soap and water. Remove old dressing, discard into plastic bag and place into trash. Cleanse the wound with Wound Cleanser prior to applying a clean dressing using gauze sponges, not tissues or cotton balls. Do not scrub or use excessive force. Pat dry using gauze sponges, not tissue or cotton balls. Peri-Wound Care: Nystatin Cream USP 30 (g) 3 x Per Week/30 Days Discharge Instructions: Use Nystatin Cream as directed. MIx 1:1 with TCA cream and applied to peri wound Peri-Wound Care: Triamcinolone Acetonide Cream, 0.1%, 15 (g) tube 3 x Per Week/30 Days Discharge Instructions: Apply as directed. mix 1:1 with nystatin cream for peri wound Primary Dressing: Aquacel Extra Hydrofiber Dressing, 4x5 (in/in) 3 x Per Week/30 Days Primary  Dressing: keystone compound 3 x Per Week/30  Days Discharge Instructions: apply to wound bed as directed on medication label PLEASE follow direction on pill bottle for proper mixing Secondary Dressing: Zetuvit Absorbent Pad, 4x8 (in/in) 3 x Per Week/30 Days Compression Wrap: 3-LAYER WRAP - Profore Lite LF 3 Multilayer Compression Bandaging System 3 x Per Week/30 Days Discharge Instructions: Apply 3 multi-layer wrap as prescribed. Electronic Signature(s) Signed: 09/06/2022 12:26:22 PM By: Kalman Shan DO Entered By: Kalman Shan on 09/06/2022 12:06:51 Laura Santiago ([id number]) -------------------------------------------------------------------------------- Problem List Details Patient Name: HELENE, BERNSTEIN. Date of Service: 09/06/2022 10:30 AM Medical Record Number: [id number] Patient Account Number: [id number] Date of Birth/Sex: 1946-10-22 (76 y.o. F) Treating RN: Cornell Barman Primary Care Provider: Hilda Lias, [place] Other Clinician: [name] Referring Provider: [facility] Treating Provider/Extender: Yaakov Guthrie in Treatment: 68 Active Problems ICD-10 Encounter Code Description Active Date MDM Diagnosis L97.822 Non-pressure chronic ulcer of other part of left lower leg with fat layer 11/02/2021 No Yes exposed I87.312 Chronic venous hypertension (idiopathic) with ulcer of left lower 11/02/2021 No Yes extremity I87.311 Chronic venous hypertension (idiopathic) with ulcer of right lower 08/30/2022 No Yes extremity I87.2 Venous insufficiency (chronic) (peripheral) 07/06/2021 No Yes Z79.01 Long term (current) use of anticoagulants 07/06/2021 No Yes I10 Essential (primary) hypertension 07/06/2021 No Yes 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 07/06/2021 No Yes Inactive Problems ICD-10 Code Description Active Date Inactive Date S81.802A Unspecified open wound, left lower leg, initial encounter 07/06/2021 07/06/2021 S91.101A Unspecified open wound  of right great toe without damage to nail, initial 08/24/2021 08/24/2021 encounter S91.104A Unspecified open wound of right lesser toe(s) without damage to nail, initial 08/24/2021 08/24/2021 encounter Resolved Problems ICD-10 ORPAH, HAUSNER ([id number]) Code Description Active Date Resolved Date S91.104D Unspecified open wound of right lesser toe(s) without damage to nail, 08/31/2021 08/31/2021 subsequent encounter S91.201D Unspecified open wound of right great toe with damage to nail, subsequent 08/31/2021 08/31/2021 encounter Electronic Signature(s) Signed: 09/06/2022 12:26:22 PM By: Kalman Shan DO Entered By: Kalman Shan on 09/06/2022 11:57:44 Laura Santiago ([id number]) -------------------------------------------------------------------------------- Progress Note Details Patient Name: Laura Santiago. Date of Service: 09/06/2022 10:30 AM Medical Record Number: [id number] Patient Account Number: [id number] Date of Birth/Sex: 1946/04/29 (76 y.o. F) Treating RN: Cornell Barman Primary Care Provider: [facility], [place], [place] Other Clinician: [name] Referring Provider: [facility] Treating Provider/Extender: Yaakov Guthrie in Treatment: 57 Subjective Chief Complaint Information obtained from Patient Left lower extremity wound Right toe wounds Left upper lateral thigh wounds History of Present Illness (HPI) Admission 7/27 Ms. Maisley Hainsworth is a 76 year old female with a past medical history of ADHD, metastatic breast cancer, stage IV chronic kidney disease, history of DVT on Xarelto and chronic venous insufficiency that presents to the clinic for a chronic left lower extremity wound. She recently moved to [facility] 4 days ago. She was being followed by wound care center in [name]. She reports a 10-year history of wounds to her left lower extremity that eventually do heal with debridement and compression therapy. She states that the current  wound reopened 4 months ago and she is using Vaseline and Coban. She denies signs of infection. 8/3; patient presents for 1 week follow-up. She reports no issues or complaints today. She states she had vascular studies done in the last week. She denies signs of infection. She brought her little service dog with her today. 8/17; patient presents for follow-up. She has missed her last clinic appointment. She states she took the wrap off and attempted to rewrap her leg.  She is having difficulty with transportation. She has her service dog with her today. Overall she feels well and reports improvement in wound healing. She denies signs of infection. She reports owning an old Velcro wrap compression and has this at her living facility 9/14; patient presents for follow-up. Patient states that over the past 2 to 3 weeks she developed toe wounds to her right foot. She attributes this to tight fitting shoes. She subsequently developed cellulitis in the right leg and has been treated by doxycycline by her oncologist. She reports improvement in symptoms however continues to have some redness and swelling to this leg. To the left lower extremity patient has been having her wraps changed with home health twice weekly. She states that the [facility] is not helping control the drainage. Other than that she has no issues or complaints today. She denies signs of infection to the left lower extremity. 9/21; patient presents for follow-up. She reports seeing infectious disease for her cellulitis. She reports no further management. She has home health that changes the wraps twice weekly. She has no issues or complaints today. She denies signs of infection. 10/5; patient presents for follow-up. She has no issues or complaints today. She denies signs of infection. She states that the right great toe has not been dressed by home health. 10/12; patient presents for follow-up. She has no issues or complaints today. She  reports improvement in her wound healing. She has been using silver alginate to the right great toe wound. She denies signs of infection. 10/26; patient presents for follow-up. Home health did not have sorbact so they continued to use Hydrofera Blue under the wrap. She has been using silver alginate to the great toe wound however she did not have a dressing in place today. She currently denies signs of infection. 11/2; patient presents for follow-up. She has been using sorb act under the compression wrap. She reports using silver alginate to the toe wound again she does not have a dressing in place. She currently denies signs of infection. 11/23; patient presents for follow-up. Unfortunately she has missed her last 2 clinic appointments. She was last seen 3 weeks ago. She did her own compression wrap with Kerlix and Coban yesterday after seeing vein and vascular. She has not been dressing her right great toe wound. She currently denies signs of infection. 11/30; patient presents for 1 week follow-up. She states she changed her dressing last week prior to home health and use sorb act with Dakin's and Hydrofera Blue. Home health has changed the dressing as well and they have been using sorbact. Today she reports increased redness to her right lower extremity. She has a history of cellulitis to this leg. She has been using silver alginate to the right great toe. Unfortunately she had an episode of diarrhea prior to coming in and had feces all over the right leg and to the wrap of her left leg. 12/7; patient presents for 1 week follow-up. She states that home health did not come out to change the dressing and she took it off yesterday. It is unclear if she is dressing the right toe wound. She denies signs of infection. 12/14; patient presents for 1 week follow-up. She has no issues or complaints today. 12/21; patient presents for follow-up. She has no issues or complaints today. She denies signs of  infection. 12/28/2021; patient presents for follow-up. She was hospitalized for sepsis secondary to right lower extremity cellulitis On 12/23. She states she is currently  at a SNF. She states that she was started on doxycycline this morning for her right great toe swelling and redness. She is not sure what dressings have been done to her left lower extremity for the past 3 weeks. She says its been mainly gauze with an Ace wrap. 1/25; patient presents for follow-up. She is still residing in a skilled nursing facility. She reports mild pain to the left lower extremity wound bed. She states she is going to see a podiatrist soon. 2/8; patient presents for follow-up. She has moved back to her residential community from her skilled nursing facility. She has no issues or complaints today. She denies signs of systemic infections. CLARETTA, KENDRA ([id number]) 2/15; patient presents for follow-up. He has no issues or complaints today. She denies systemic signs of infection. 2/22; patient presents for follow-up. She has no issues or complaints today. She denies signs of infection. 3/1; patient presents for follow-up. She states that home health came out the day after she was seen in our clinic and yesterday to do the wrap change. She denies signs of infection. She reports excoriated skin on the ankle. 3/8; patient presents for follow-up. She has no issues or complaints today. She denies signs of infection. 3/15; patient presents for follow-up. Home health has been coming out to change the dressings. She reports more tenderness to the wound site. She denies purulent drainage, increased warmth or erythema to the area. 4/5; patient presents for follow-up. She has missed her last 2 clinic appointments. I have not seen her in 3 weeks. She was recently hospitalized for altered mental status. She was involuntarily committed. She was evaluated by psychiatry and deemed to have competency. There was no specific cause  of her altered mental status. It was concluded that her physical and mental health were declining due to her chronic medical conditions. Currently home health has been coming out for dressing changes. Patient has also been doing her own dressing changes. She reports more skin breakdown to the periwound and now has a new wound. She denies fever/chills. She reports continued tenderness to the wound site. 4/12; patient with significant venous insufficiency and a large wound on her left lower leg taking up about 80% of the circumference of her lower leg. Cultures of this grew MRSA and Pseudomonas. She had completed a course of ciprofloxacin now is starting doxycycline. She has been using Dakin's wet-to-dry and a Tubigrip. She has home health twice a week and we change it once. 4/19; patient presents for follow-up. She completed her course of doxycycline. She has been using Dakin's wet-to-dry dressing and Tubigrip. Home health changes the dressing twice weekly. Currently she has no issues or complaints. 4/26; patient presents for follow-up. At last clinic visit orders for home health were Iodosorb under compression therapy. Unfortunately they did not have the dressing and have been using Dakin's and gentamicin under the wrap. Patient currently denies signs of infection. She has no issues or complaints today. 5/3; patient presents for follow-up. Again Iodosorb has not been used under the compression therapy when home health comes out to change the wrap and dressing. They have been using Sorbact. It is unclear why this is happening since we send orders weekly to the agency. She denies signs of infection. Patient has not purchased the [facility] antibiotics. We reached out to the company and they said they have been trying to contact her on a regular basis. We gave the patient the number to call to order the medication. 5/10; patient  presents for follow-up. She has no issues or complaints today. Again home  health has not been using Iodosorb. Mepilex was on the wound bed. No other dressings noted. She brought in her Keystone antibiotics. She denies signs of infection. 5/17; patient presents for follow-up. Home health has come out twice since she was last seen. Joint well she has been using Keystone antibiotic with Sorbact under the compression wrap. She has no issues or complaints today. She denies signs of infection. 5/24; patient presents for follow-up. We have been using Keystone antibiotics with Sorbact under compression therapy. She is tolerating the treatment well. She is reporting improvement in wound healing. She denies signs of infection. 5/31; patient presents for follow-up. We continue to do [facility] antibiotics with Sorbact under compression therapy. She continues to report improvement in wound healing. Home health comes out and changes the dressing once weekly. 05-17-2022 upon evaluation today patient appears to be doing better in regard to her wound especially compared to the last time I saw her. Fortunately I do think that she is seeing improvements. With that being said I do believe that she may be benefit from sharp debridement today to clear away some of the necrotic debris I discussed that with her as well. She is an amendable to that plan. Otherwise she is very pleased with how the [employer] is doing for her. 6/14; patient presents for follow-up. We have been using Keystone antibiotic with Sorbact and absorbent dressings under 3 layer compression. She has no issues or complaints today. She reports improvement in wound healing. She denies signs of infection. 6/21; patient presents for follow-up. We are continuing with [facility] antibiotic and Sorbact under 3 layer compression. Patient has no complaints. Continued wound healing is happening. She denies signs of infection. 6/28; patient presents for follow-up. We have been using Keystone antibiotic with Sorbact under 3 layer compression.  Usually home health comes out and changes the dressing twice a week. Unfortunately they did not go out to change the dressing. It is unclear why. Patient did not call them. She currently denies signs of infection. 7/5; patient presents for follow-up. We have been using Keystone antibiotic with calcium alginate under 3 layer compression. She reports improvement in wound healing. She denies signs of infection. Home health has come out to do dressing changes twice this past week. 7/12; patient presents for follow-up. We have been using Keystone antibiotic with calcium alginate under 3 layer compression. Patient states that home health came out once last week to change the dressing. She reports improvement in wound healing. She currently denies signs of infection. 7/19; patient presents for follow-up. We have been using Keystone antibiotic with calcium alginate under 3 layer compression. Home health came out once last week to change the dressing. She has no issues or complaints today. She denies signs of infection. 8/2; patient presents for follow-up. We have been using Keystone antibiotic with calcium alginate under 3 layer compression. Unfortunately she missed her appointment last week and home health did not come out to do dressing changes. Patient currently denies signs of infection. 8/9; patient presents for follow-up. We have been using Keystone with calcium alginate under 3 layer compression. She states that home health came out once last week. She currently denies signs of infection. Her wrap was completely wet. She states she was cleaning the top of the leg and water soaked down into the wrap. 8/16; patient presents for follow-up. We have been using Keystone with calcium alginate under 3 layer compression. She  states that home health came out twice last week. She has no issues or complaints today. TEMIMA, KUTSCH ([id number]) 8/23; patient presents for follow-up. He has been using Keystone  with calcium alginate under 3 layer compression. Home health came out twice last week. She denies signs of infection. 8/30; patient presents for follow-up. We have been using Keystone with calcium alginate under 3 layer compression. Home health came out once last week to change the dressing. Patient reports improvement in wound healing. She states she is almost done with her chemotherapy infusions and has 1 more left. 9/13; patient presents for follow-up. She has lost the capsules to her [facility] antibiotic which I believe is the vancomycin pills. She has her Zosyn powder today. We have been using Keystone antibiotic ointment with calcium alginate under 3 layer compression. She is concerned about systemic infection however her vitals are stable and there is no surrounding soft tissue infection. She would like to remain a patient in our wound care center however would like a second opinion for her wound care at another facility. She asked to be referred to [facility] wound care center. 9/20; patient presents for follow-up. She found her vancomycin capsules and brought in her complete Keystone antibiotic ointment set today. Unfortunately she has developed skin breakdown and Erythema to the right lower extremity With increased swelling. She states she went to a pow wow Over the weekend and was on her feet for extended periods of time. She saw her oncologist yesterday who prescribed her doxycycline for her right lower extremity erythema. 9/27; patient presents for follow-up. We have been using Keystone antibiotic with Aquacel under 3 layer compression to the lower extremities bilaterally. When home health came and changed the wrap she secretly put coffee into the spray mix along with [facility] antibiotic on her leg thinking the acidic component would better activate the zoysn (sonething she discussed with her microbiologist brother). She has reported improvement in wound  healing. Objective Constitutional Vitals Time Taken: 10:45 AM, Height: 66 in, Weight: 153 lbs, BMI: 24.7, Temperature: 98.6 F, Pulse: 87 bpm, Respiratory Rate: 18 breaths/min, Blood Pressure: 167/90 mmHg. General Notes: Left lower extremity: Open wound with granulation tissue and nonviable tissue. No signs of surrounding soft tissue infection. Right lower extremity: Few scattered areas of skin breakdown to the anterior aspect. Good edema control. No signs of surrounding soft tissue infection. Integumentary (Hair, Skin) Wound #1 status is Open. Original cause of wound was Gradually Appeared. The date acquired was: 04/06/2021. The wound has been in treatment 61 weeks. The wound is located on the Left,Medial Lower Leg. The wound measures 7.5cm length x 11cm width x 0.2cm depth; 64.795cm^2 area and 12.959cm^3 volume. There is Fat Layer (Subcutaneous Tissue) exposed. There is a medium amount of serosanguineous drainage noted. The wound margin is flat and intact. There is medium (34-66%) red, pink granulation within the wound bed. There is a medium (34-66%) amount of necrotic tissue within the wound bed including Adherent Slough. Wound #7 status is Open. Original cause of wound was Not Known. The date acquired was: 08/28/2022. The wound has been in treatment 1 weeks. The wound is located on the Right,Anterior Lower Leg. The wound measures 0.1cm length x 0.1cm width x 0.1cm depth; 0.008cm^2 area and 0.001cm^3 volume. There is a medium amount of serosanguineous drainage noted. The wound margin is flat and intact. There is no granulation within the wound bed. Assessment Active Problems ICD-10 Non-pressure chronic ulcer of other part of left lower leg with fat  layer exposed Chronic venous hypertension (idiopathic) with ulcer of left lower extremity Chronic venous hypertension (idiopathic) with ulcer of right lower extremity Venous insufficiency (chronic) (peripheral) Long term (current) use of  anticoagulants Essential (primary) hypertension Secondary malignant neoplasm of breast BRIONY, PARVEEN. ([id number]) Patient's wounds have shown improvement in size and appearance since last clinic visit. I debrided nonviable tissue. I recommended continuing the course with [facility] antibiotic and Aquacel to the lower extremities bilaterally. I recommended not using coffee in the spray mix. Overall wounds appear well-healing. No signs of infection. Follow-up in 1 week. 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 There was a Excisional Skin/Subcutaneous Tissue Debridement with a total area of 82.5 sq cm performed by Kalman Shan, MD. With the following instrument(s): Curette to remove Viable and Non-Viable tissue/material. Material removed includes Subcutaneous Tissue and Slough and. A time out was conducted at 11:17, prior to the start of the procedure. A Minimum amount of bleeding was controlled with Pressure. The procedure was tolerated well. Post Debridement Measurements: 7.5cm length x 11cm width x 0.3cm depth; 19.439cm^3 volume. Character of Wound/Ulcer Post Debridement is stable. Severity of Tissue Post Debridement is: Fat layer exposed. 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 There was a Three Layer Compression Therapy Procedure with a pre-treatment ABI of 1.5 by [name]. Post procedure Diagnosis Wound #1: Same as Pre-Procedure Plan Follow-up Appointments: Return Appointment in 1 week. Nurse Visit as needed Home Health: [place]: - [place] for wound care. May utilize formulary equivalent dressing for wound treatment orders unless otherwise specified. Home Health Nurse may visit [facility] to address patient  s wound care needs. **Please direct any NON-WOUND related issues/requests for orders to patient's Primary Care Physician. **If current dressing causes regression in wound condition, may D/C ordered dressing product/s and apply Normal Saline Moist Dressing daily until next [place] or Other MD appointment. **Notify Wound Healing Center of regression in wound condition at [phone]. Other Home Health Orders/Instructions: - Dressing change 3 x weekly, twice by home health and once at wound clinic weekly. PLEASE make sure frequency between dressing changes is appropriate. Bathing/ Shower/ Hygiene: May shower with wound dressing protected with water repellent cover or cast protector. No tub bath. Anesthetic (Use 'Patient Medications' Section for Anesthetic Order Entry): Lidocaine applied to wound bed Edema Control - Lymphedema / Segmental Compressive Device / Other: Optional: One layer of unna paste to top of compression wrap (to act as an anchor). - PLEASE when applying wrap start from toes and go up to just below knee Elevate, Exercise Daily and Avoid Standing for Long Periods of Time. Elevate legs to the level of the heart and pump ankles as often as possible Elevate leg(s) parallel to the floor when sitting. DO YOUR BEST to sleep in the bed at night. DO NOT sleep in your recliner. Long hours of sitting in a recliner leads to swelling of the legs and/or potential wounds on your backside. Additional Orders / Instructions: Follow Nutritious Diet and Increase Protein Intake Medications-Please add to medication list.: Keystone Compound WOUND #1: - Lower Leg Wound Laterality: Left, Medial Cleanser: Wound Cleanser (Home Health) 3 x Per Week/30 Days Discharge Instructions: Wash your hands with soap and water. Remove old dressing, discard into plastic  bag and place into trash. Cleanse the wound with Wound Cleanser prior to applying a clean dressing using gauze sponges, not tissues or  cotton balls. Do not scrub or use excessive force. Pat dry using gauze sponges, not tissue or cotton balls. Peri-Wound Care: Nystatin Cream USP 30 (g) 3 x Per Week/30 Days Discharge Instructions: Use Nystatin Cream as directed. MIx 1:1 with TCA cream and applied to peri wound Peri-Wound Care: Triamcinolone Acetonide Cream, 0.1%, 15 (g) tube 3 x Per Week/30 Days Discharge Instructions: Apply as directed. mix 1:1 with nystatin cream for peri wound Primary Dressing: Aquacel Extra Hydrofiber Dressing, 4x5 (in/in) 3 x Per Week/30 Days Primary Dressing: keystone compound 3 x Per Week/30 Days Discharge Instructions: apply to wound bed as directed on medication label PLEASE follow direction on pill bottle for proper mixing Secondary Dressing: Zetuvit Absorbent Pad, 4x8 (in/in) 3 x Per Week/30 Days Compression Wrap: 3-LAYER WRAP - Profore Lite LF 3 Multilayer Compression Bandaging System 3 x Per Week/30 Days Discharge Instructions: Apply 3 multi-layer wrap as prescribed. WOUND #7: - Lower Leg Wound Laterality: Right, Anterior Cleanser: Wound Cleanser (Home Health) 3 x Per Week/30 Days Discharge Instructions: Wash your hands with soap and water. Remove old dressing, discard into plastic bag and place into trash. Cleanse the wound with Wound Cleanser prior to applying a clean dressing using gauze sponges, not tissues or cotton balls. Do not scrub or use excessive force. Pat dry using gauze sponges, not tissue or cotton balls. ALONNAH, LAMPKINS. ([id number]) Peri-Wound Care: Nystatin Cream USP 30 (g) 3 x Per Week/30 Days Discharge Instructions: Use Nystatin Cream as directed. MIx 1:1 with TCA cream and applied to peri wound Peri-Wound Care: Triamcinolone Acetonide Cream, 0.1%, 15 (g) tube 3 x Per Week/30 Days Discharge Instructions: Apply as directed. mix 1:1 with nystatin cream for peri wound Primary Dressing: Aquacel Extra Hydrofiber Dressing, 4x5 (in/in) 3 x Per Week/30 Days Primary Dressing: keystone  compound 3 x Per Week/30 Days Discharge Instructions: apply to wound bed as directed on medication label PLEASE follow direction on pill bottle for proper mixing Secondary Dressing: Zetuvit Absorbent Pad, 4x8 (in/in) 3 x Per Week/30 Days Compression Wrap: 3-LAYER WRAP - Profore Lite LF 3 Multilayer Compression Bandaging System 3 x Per Week/30 Days Discharge Instructions: Apply 3 multi-layer wrap as prescribed. 1. In office sharp debridement 2. Keystone antibiotic and Aquacel under 3 layer compression to the lower extremities bilaterally 3. Follow-up in 1 week Electronic Signature(s) Signed: 09/06/2022 12:26:22 PM By: Kalman Shan DO Entered By: Kalman Shan on 09/06/2022 12:06:24 Laura Santiago ([id number]) -------------------------------------------------------------------------------- ROS/PFSH Details Patient Name: Laura Santiago. Date of Service: 09/06/2022 10:30 AM Medical Record Number: [id number] Patient Account Number: [id number] Date of Birth/Sex: 1946-05-14 (76 y.o. F) Treating RN: Cornell Barman Primary Care Provider: Hilda Lias, [place] Other Clinician: [name] Referring Provider: [facility] Treating Provider/Extender: Yaakov Guthrie in Treatment: 52 Information Obtained From Patient Eyes Medical History: Negative for: Cataracts; Glaucoma; Optic Neuritis Ear/Nose/Mouth/Throat Medical History: Negative for: Chronic sinus problems/congestion; Middle ear problems Hematologic/Lymphatic Medical History: Negative for: Anemia; Hemophilia; Human Immunodeficiency Virus; Lymphedema; Sickle Cell Disease Respiratory Medical History: Negative for: Aspiration; Asthma; Chronic Obstructive Pulmonary Disease (COPD); Pneumothorax; Sleep Apnea; Tuberculosis Cardiovascular Medical History: Positive for: Hypertension Negative for: Angina; Arrhythmia; Congestive Heart Failure; Coronary Artery Disease; Deep Vein Thrombosis; Hypotension;  Myocardial Infarction; Peripheral Arterial Disease; Peripheral Venous Disease; Phlebitis; Vasculitis Gastrointestinal Medical History: Negative for: Cirrhosis ; Colitis; Crohnos; Hepatitis A; Hepatitis B; Hepatitis C Endocrine Medical History:  Negative for: Type I Diabetes; Type II Diabetes Genitourinary Medical History: Negative for: End Stage Renal Disease Immunological Medical History: Negative for: Lupus Erythematosus; Raynaudos; Scleroderma Integumentary (Skin) Medical History: Negative for: History of Burn; History of pressure wounds Musculoskeletal Medical History: Positive for: Osteoarthritis ZYKIA, WALLA ([id number]) Negative for: Gout; Rheumatoid Arthritis; Osteomyelitis Oncologic Medical History: Positive for: Received Chemotherapy; Received Radiation Past Medical History Notes: breast cancer Immunizations Pneumococcal Vaccine: Received Pneumococcal Vaccination: No Implantable Devices None Family and Social History 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 Date of Service: 09/06/2022 Medical Record Number: [id number] Patient Account Number: [id number] Date of Birth/Sex: [birth year] (76 y.o. F) Treating RN: Cornell Barman Primary Care Provider: Hilda Lias, [place] Other Clinician: [name] Referring Provider: [facility] Treating Provider/Extender: Yaakov Guthrie in Treatment: 61 Diagnosis Coding ICD-10 Codes Code Description [phone] 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 I87.311  Chronic venous hypertension (idiopathic) with ulcer of right lower extremity I87.2 Venous insufficiency (chronic) (peripheral) Z79.01 Long term (current) use of anticoagulants I10 Essential (primary) hypertension 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 Facility Procedures CPT4 Code: [id number] Description: 50037 - DEB SUBQ TISSUE 20 SQ CM/< Modifier: Quantity: 1 CPT4 Code: Description: ICD-10 Diagnosis Description L97.822 Non-pressure chronic ulcer of other part of left lower leg with fat layer Modifier: exposed Quantity: CPT4 Code: [id number] Description: 94503 - DEB SUBQ TISS EA ADDL 20CM Modifier: Quantity: 4 CPT4 Code: Description: ICD-10 Diagnosis Description L97.822 Non-pressure chronic ulcer of other part of left lower leg with fat layer Modifier: exposed Quantity: Physician Procedures CPT4 Code: [id number] Description: 11042 - WC PHYS SUBQ TISS 20 SQ CM Modifier: Quantity: 1 CPT4 Code: Description: ICD-10 Diagnosis Description L97.822 Non-pressure chronic ulcer of other part of left lower leg with fat layer Modifier: exposed Quantity: CPT4 Code: [id number] Description: 11045 - WC PHYS SUBQ TISS EA ADDL 20 CM Modifier: Quantity: 4 CPT4 Code: Description: ICD-10 Diagnosis Description L97.822 Non-pressure chronic ulcer of other part of left lower leg with fat layer Modifier: exposed Quantity: Electronic Signature(s) Signed: 09/06/2022 12:26:22 PM By: Kalman Shan DO Entered By: Kalman Shan on 09/06/2022 12:06:41

## 2022-09-08 NOTE — Progress Notes (Signed)
TAQWA, DEEM ([id number]) Visit Report for 09/06/2022 Arrival Information Details Patient Name: Laura Santiago, Laura Santiago. Date of Service: 09/06/2022 10:30 AM Medical Record Number: [id number] Patient Account Number: [id number] Date of Birth/Sex: Apr 04, 1946 (76 y.o. F) Treating RN: Cornell Barman Primary Care Jayce Boyko: [facility], [place] Other Clinician: [name] Referring Emunah Texidor: [facility] Treating Hortense Cantrall/Extender: Yaakov Guthrie in Treatment: 65 Visit Information History Since Last Visit All ordered tests and consults were completed: No Patient Arrived: [name] Added or deleted any medications: No Arrival Time: 10:38 Any new allergies or adverse reactions: No Transfer Assistance: None Had a fall or experienced change in No Patient Requires Transmission-Based No activities of daily living that may affect Precautions: risk of falls: Patient Has Alerts: Yes Hospitalized since last visit: No Patient Alerts: PT HAS SERVICE Pain Present Now: Yes ANIMAL ABI 07/11/21 R) 1.16 L) 1.27 Electronic Signature(s) Signed: 09/08/2022 12:10:36 PM By: [name] Entered By: [name] on 09/06/2022 12:01:35 Laura Santiago ([id number]) -------------------------------------------------------------------------------- Clinic Level of Care Assessment Details Patient Name: Laura Santiago. Date of Service: 09/06/2022 10:30 AM Medical Record Number: [id number] Patient Account Number: [id number] Date of Birth/Sex: October 06, 1946 (76 y.o. F) Treating RN: Cornell Barman Primary Care Tambria Pfannenstiel: [facility], [place] Other Clinician: [name] Referring Aliviana Burdell: [facility] Treating Otto Felkins/Extender: Yaakov Guthrie in Treatment: 36 Clinic Level of Care Assessment Items TOOL 1 Quantity Score _0  - Use when EandM and Procedure is performed on INITIAL visit 0 ASSESSMENTS - Nursing Assessment / Reassessment _1  - General Physical Exam (combine w/  comprehensive assessment (listed just below) when performed on new 0 pt. evals) _2  - 0 Comprehensive Assessment (HX, ROS, Risk Assessments, Wounds Hx, etc.) ASSESSMENTS - Wound and Skin Assessment / Reassessment _3  - Dermatologic / Skin Assessment (not related to wound area) 0 ASSESSMENTS - Ostomy and/or Continence Assessment and Care _4  - Incontinence Assessment and Management 0 _5  - 0 Ostomy Care Assessment and Management (repouching, etc.) PROCESS - Coordination of Care _6  - Simple Patient / Family Education for ongoing care 0 _7  - 0 Complex (extensive) Patient / Family Education for ongoing care _8  - 0 Staff obtains Programmer, systems, Records, Test Results / Process Orders _9  - 0 Staff telephones HHA, Nursing Homes / Clarify orders / etc _10  - 0 Routine Transfer to another Facility (non-emergent condition) _11  - 0 Routine Hospital Admission (non-emergent condition) _12  - 0 New Admissions / Biomedical engineer / Ordering NPWT, Apligraf, etc. _13  - 0 Emergency Hospital Admission (emergent condition) PROCESS - Special Needs _14  - Pediatric / Minor Patient Management 0 _15  - 0 Isolation Patient Management _16  - 0 Hearing / Language / Visual special needs _17  - 0 Assessment of Community assistance (transportation, D/C planning, etc.) _18  - 0 Additional assistance / Altered mentation _19  - 0 Support Surface(s) Assessment (bed, cushion, seat, etc.) INTERVENTIONS - Miscellaneous _20  - External ear exam 0 _21  - 0 Patient Transfer (multiple staff / Civil Service fast streamer / Similar devices) _22  - 0 Simple Staple / Suture removal (25 or less) _23  - 0 Complex Staple / Suture removal (26 or more) _24  - 0 Hypo/Hyperglycemic Management (do not check if billed separately) _25  - 0 Ankle / Brachial Index (ABI) - do not check if billed separately Has the patient been seen at the hospital within the last three years: Yes Total Score: 0 Level Of Care: ____ Laura Santiago ([id number]) Electronic  Signature(s) Signed: 09/08/2022 12:10:36 PM By: [name] Entered By: [name] on 09/06/2022 11:21:17 Laura Santiago ([id number]) --------------------------------------------------------------------------------  Compression Therapy Details Patient Name: Laura Santiago, Laura Santiago. Date of Service: 09/06/2022 10:30 AM Medical Record Number: [id number] Patient Account Number: [id number] Date of Birth/Sex: 02-Jan-1946 (76 y.o. F) Treating RN: Cornell Barman Primary Care Josilyn Shippee: [facility], [place] Other Clinician: [name] Referring Saadiq Poche: [facility] Treating Rally Ouch/Extender: Yaakov Guthrie in Treatment: 45 Compression Therapy Performed for Wound Assessment: Wound #1 Left,Medial Lower Leg Performed By: [name], [name], Compression Type: Three Layer Pre Treatment ABI: 1.5 Post Procedure Diagnosis Same as Pre-procedure Electronic Signature(s) Signed: 09/08/2022 12:10:36 PM By: [name] Entered By: [name] on 09/06/2022 11:20:09 Laura Santiago ([id number]) -------------------------------------------------------------------------------- Encounter Discharge Information Details Patient Name: Laura Santiago, Laura Santiago. Date of Service: 09/06/2022 10:30 AM Medical Record Number: [id number] Patient Account Number: [id number] Date of Birth/Sex: May 14, 1946 (76 y.o. F) Treating RN: Cornell Barman Primary Care Donalyn Schneeberger: [facility], [place] Other Clinician: [name] Referring Ashlye Oviedo: [facility] Treating Sharbel Sahagun/Extender: Yaakov Guthrie in Treatment: 30 Encounter Discharge Information Items Post Procedure Vitals Discharge Condition: Stable Temperature (F): 98.6 Ambulatory Status: Walker Pulse (bpm): 87 Discharge Destination: Home Respiratory Rate (breaths/min): 18 Transportation: Other Blood Pressure (mmHg): 167/90 Accompanied By: self Schedule Follow-up Appointment: Yes Clinical Summary of Care: Electronic  Signature(s) Signed: 09/08/2022 12:10:36 PM By: [name] Entered By: [name] on 09/06/2022 11:22:30 Laura Santiago ([id number]) -------------------------------------------------------------------------------- Lower Extremity Assessment Details Patient Name: Laura Santiago. Date of Service: 09/06/2022 10:30 AM Medical Record Number: [id number] Patient Account Number: [id number] Date of Birth/Sex: 1946-08-04 (76 y.o. F) Treating RN: Cornell Barman Primary Care Bo Teicher: [facility], [place] Other Clinician: [name] Referring Urijah Arko: [facility] Treating Bernis Stecher/Extender: Yaakov Guthrie in Treatment: 61 Edema Assessment Assessed: [Left: Yes] [Right: Yes] Edema: [Left: Yes] [Right: Yes] Calf Left: Right: Point of Measurement: 30 cm From Medial Instep 32.4 cm 36.6 cm Ankle Left: Right: Point of Measurement: 10 cm From Medial Instep 23 cm 23.5 cm Vascular Assessment Pulses: Dorsalis Pedis Palpable: [Left:Yes] [Right:Yes] Posterior Tibial Palpable: [Left:Yes] [Right:Yes] Electronic Signature(s) Signed: 09/06/2022 5:32:28 PM By: Gretta Cool, BSN, RN, CWS, [name] RN, BSN Signed: 09/08/2022 12:10:36 PM By: [name] Entered By: [name] on 09/06/2022 11:02:19 Laura Santiago ([id number]) -------------------------------------------------------------------------------- Multi Wound Chart Details Patient Name: Laura Santiago. Date of Service: 09/06/2022 10:30 AM Medical Record Number: [id number] Patient Account Number: [id number] Date of Birth/Sex: Nov 01, 1946 (76 y.o. F) Treating RN: Cornell Barman Primary Care Mykah Bellomo: [facility], [place] Other Clinician: [name] Referring Jsean Taussig: [facility] Treating Xyler Terpening/Extender: Yaakov Guthrie in Treatment: 67 Vital Signs Height(in): 21 Pulse(bpm): 54 Weight(lbs): 153 Blood Pressure(mmHg): 167/90 Body Mass Index(BMI): 24.7 Temperature(F): 98.6 Respiratory  Rate(breaths/min): 18 Photos: [N/A:N/A] Wound Location: Left, Medial Lower Leg Right, Anterior Lower Leg N/A Wounding Event: Gradually Appeared Not Known N/A Primary Etiology: Venous Leg Ulcer Cellulitis N/A Comorbid History: Hypertension, Osteoarthritis, Hypertension, Osteoarthritis, N/A Received Chemotherapy, Received Received Chemotherapy, Received Radiation Radiation Date Acquired: 04/06/2021 08/28/2022 N/A Weeks of Treatment: 61 1 N/A Wound Status: Open Open N/A Wound Recurrence: No No N/A Clustered Wound: Yes No N/A Measurements L x W x D (cm) 7.5x11x0.2 0.1x0.1x0.1 N/A Area (cm) : 64.795 0.008 N/A Volume (cm) : 12.959 0.001 N/A % Reduction in Area: 28.30% 100.00% N/A % Reduction in Volume: 28.30% 100.00% N/A Classification: Full Thickness Without Exposed Partial Thickness N/A Support Structures Exudate Amount: Medium Medium N/A Exudate Type: Serosanguineous Serosanguineous N/A Exudate Color: red, brown red, brown N/A Wound Margin: Flat and Intact Flat and Intact N/A Granulation Amount: Medium (34-66%) None Present (0%) N/A  Granulation Quality: Red, Pink N/A N/A Necrotic Amount: Medium (34-66%) N/A N/A Exposed Structures: Fat Layer (Subcutaneous Tissue): N/A N/A Yes Fascia: No Tendon: No Muscle: No Joint: No Bone: No Epithelialization: Small (1-33%) None N/A Debridement: Debridement - Excisional N/A N/A Pre-procedure Verification/Time 11:17 N/A N/A Out Taken: Tissue Debrided: Subcutaneous, Slough N/A N/A Level: Skin/Subcutaneous Tissue N/A N/A Debridement Area (sq cm): 82.5 N/A N/A Instrument: Curette N/A N/A Bleeding: Minimum N/A N/A Hemostasis Achieved: Pressure N/A N/A Laura Santiago, Laura Santiago ([id number]) Debridement Treatment Procedure was tolerated well N/A N/A Response: Post Debridement 7.5x11x0.3 N/A N/A Measurements L x W x D (cm) Post Debridement Volume: 19.439 N/A N/A (cm) Procedures Performed: Compression Therapy N/A N/A Debridement Treatment  Notes Wound #1 (Lower Leg) Wound Laterality: Left, Medial Cleanser Wound Cleanser Discharge Instruction: Wash your hands with soap and water. Remove old dressing, discard into plastic bag and place into trash. Cleanse the wound with Wound Cleanser prior to applying a clean dressing using gauze sponges, not tissues or cotton balls. Do not scrub or use excessive force. Pat dry using gauze sponges, not tissue or cotton balls. Peri-Wound Care Nystatin Cream USP 30 (g) Discharge Instruction: Use Nystatin Cream as directed. MIx 1:1 with TCA cream and applied to peri wound Triamcinolone Acetonide Cream, 0.1%, 15 (g) tube Discharge Instruction: Apply as directed. mix 1:1 with nystatin cream for peri wound Topical Primary Dressing Aquacel Extra Hydrofiber Dressing, 4x5 (in/in) keystone compound Discharge Instruction: apply to wound bed as directed on medication label PLEASE follow direction on pill bottle for proper mixing Secondary Dressing Zetuvit Absorbent Pad, 4x8 (in/in) Secured With Compression Wrap 3-LAYER WRAP - Profore Lite LF 3 Multilayer Compression Bandaging System Discharge Instruction: Apply 3 multi-layer wrap as prescribed. Compression Stockings Add-Ons Wound #7 (Lower Leg) Wound Laterality: Right, Anterior Cleanser Wound Cleanser Discharge Instruction: Wash your hands with soap and water. Remove old dressing, discard into plastic bag and place into trash. Cleanse the wound with Wound Cleanser prior to applying a clean dressing using gauze sponges, not tissues or cotton balls. Do not scrub or use excessive force. Pat dry using gauze sponges, not tissue or cotton balls. Peri-Wound Care Nystatin Cream USP 30 (g) Discharge Instruction: Use Nystatin Cream as directed. MIx 1:1 with TCA cream and applied to peri wound Triamcinolone Acetonide Cream, 0.1%, 15 (g) tube Discharge Instruction: Apply as directed. mix 1:1 with nystatin cream for peri wound Topical Primary  Dressing Aquacel Extra Hydrofiber Dressing, 4x5 (in/in) keystone compound Discharge Instruction: apply to wound bed as directed on medication label PLEASE follow direction on pill bottle for proper mixing Secondary Dressing CHAIRTY, TOMAN. ([id number]) Zetuvit Absorbent Pad, 4x8 (in/in) Secured With Compression Wrap 3-LAYER WRAP - Profore Lite LF 3 Multilayer Compression Bandaging System Discharge Instruction: Apply 3 multi-layer wrap as prescribed. Compression Stockings Add-Ons Electronic Signature(s) Signed: 09/06/2022 12:26:22 PM By: Kalman Shan DO Entered By: Kalman Shan on 09/06/2022 12:07:19 Laura Santiago ([id number]) -------------------------------------------------------------------------------- Multi-Disciplinary Care Plan Details Patient Name: Laura Santiago, Laura Santiago. Date of Service: 09/06/2022 10:30 AM Medical Record Number: [id number] Patient Account Number: [id number] Date of Birth/Sex: 1946-02-06 (76 y.o. F) Treating RN: Cornell Barman Primary Care Gatsby Chismar: [facility], [place] Other Clinician: [name] Referring Maleeah Crossman: [facility] Treating Emile Kyllo/Extender: Yaakov Guthrie in Treatment: 35 Active Inactive Soft Tissue Infection Nursing Diagnoses: Impaired tissue integrity Potential for infection: soft tissue Goals: Patient's soft tissue infection will resolve Date Initiated: 03/15/2022 Target Resolution Date: 04/27/2022 Goal Status: Active Signs and symptoms of infection will be recognized early to  allow for prompt treatment Date Initiated: 03/15/2022 Date Inactivated: 04/26/2022 Target Resolution Date: 04/27/2022 Goal Status: Met Interventions: Assess signs and symptoms of infection every visit Treatment Activities: Culture and sensitivity : 03/15/2022 Notes: Wound/Skin Impairment Nursing Diagnoses: Impaired tissue integrity Goals: Patient/caregiver will verbalize understanding of skin care regimen Date Initiated:  07/06/2021 Date Inactivated: 07/27/2021 Target Resolution Date: 07/06/2021 Goal Status: Met Ulcer/skin breakdown will have a volume reduction of 30% by week 4 Date Initiated: 07/06/2021 Date Inactivated: 10/12/2021 Target Resolution Date: 08/06/2021 Goal Status: Unmet Unmet Reason: cont tx Ulcer/skin breakdown will have a volume reduction of 50% by week 8 Date Initiated: 07/06/2021 Target Resolution Date: 09/06/2021 Goal Status: Active Ulcer/skin breakdown will have a volume reduction of 80% by week 12 Date Initiated: 07/06/2021 Target Resolution Date: 10/06/2021 Goal Status: Active Ulcer/skin breakdown will heal within 14 weeks Date Initiated: 07/06/2021 Target Resolution Date: 11/06/2021 Goal Status: Active Interventions: Assess patient/caregiver ability to obtain necessary supplies Assess patient/caregiver ability to perform ulcer/skin care regimen upon admission and as needed Assess ulceration(s) every visit Treatment Activities: Referred to DME Mik[employer] Udall for dressing supplies : 07/06/2021 Skin care regimen initiated : 07/06/2021 Notes: KRYSTIN, KEEVEN ([id number]) Electronic Signature(s) Signed: 09/06/2022 5:32:28 PM By: Gretta Cool, BSN, RN, CWS, [name] RN, BSN Signed: 09/08/2022 12:10:36 PM By: [name] Entered By: [name] on 09/06/2022 11:02:25 JADIRA, NIERMAN ([id number]) -------------------------------------------------------------------------------- Pain Assessment Details Patient Name: Laura Santiago, Laura Santiago. Date of Service: 09/06/2022 10:30 AM Medical Record Number: [id number] Patient Account Number: [id number] Date of Birth/Sex: 11-10-1946 (76 y.o. F) Treating RN: Cornell Barman Primary Care Adedamola Seto: [facility], [place] Other Clinician: [name] Referring Jocie Meroney: [facility] Treating Truda Staub/Extender: Yaakov Guthrie in Treatment: 59 Active Problems Location of Pain Severity and Description of Pain Patient Has Paino Yes Site Locations Pain  Location: Generalized Pain Duration of the Pain. Constant / Intermittento Constant Rate the pain. Current Pain Level: 7 Character of Pain Describe the Pain: Aching, Stabbing Pain Management and Medication Current Pain Management: Medication: Yes Rest: Yes Electronic Signature(s) Signed: 09/06/2022 5:32:28 PM By: Gretta Cool, BSN, RN, CWS, [name] RN, BSN Signed: 09/08/2022 12:10:36 PM By: [name] Entered By: [name] on 09/06/2022 10:48:03 Laura Santiago ([id number]) -------------------------------------------------------------------------------- Patient/Caregiver Education Details Patient Name: Laura Santiago, Laura Santiago. Date of Service: 09/06/2022 10:30 AM Medical Record Number: [id number] Patient Account Number: [id number] Date of Birth/Gender: [birth year] (76 y.o. F) Treating RN: Cornell Barman Primary Care Physician: [facility], [place] Other Clinician: [name] Referring Physician: [facility] Treating Physician/Extender: Yaakov Guthrie in Treatment: 63 Education Assessment Education Provided To: Patient Education Topics Provided Wound/Skin Impairment: Handouts: Other: continue wound care as directed Methods: Explain/Verbal Responses: State content correctly Electronic Signature(s) Signed: 09/08/2022 12:10:36 PM By: [name] Entered By: [name] on 09/06/2022 11:21:44 Laura Santiago ([id number]) -------------------------------------------------------------------------------- Wound Assessment Details Patient Name: Laura Santiago. Date of Service: 09/06/2022 10:30 AM Medical Record Number: [id number] Patient Account Number: [id number] Date of Birth/Sex: Mar 27, 1946 (76 y.o. F) Treating RN: Cornell Barman Primary Care Adil Tugwell: [facility], [place] Other Clinician: [name] Referring Solmon Bohr: [facility] Treating Camry Theiss/Extender: Yaakov Guthrie in Treatment: 61 Wound Status Wound Number: 1 Primary Venous Leg  Ulcer Etiology: Wound Location: Left, Medial Lower Leg Wound Status: Open Wounding Event: Gradually Appeared Comorbid Hypertension, Osteoarthritis, Received Chemotherapy, Date Acquired: 04/06/2021 History: Received Radiation Weeks Of Treatment: 61 Clustered Wound: Yes Photos Wound Measurements Length: (cm) 7.5 Width: (cm) 11 Depth: (cm) 0.2 Area: (cm) 64.795 Volume: (cm) 12.959 % Reduction in Area: 28.3% %  Reduction in Volume: 28.3% Epithelialization: Small (1-33%) Wound Description Classification: Full Thickness Without Exposed Support Structures Wound Margin: Flat and Intact Exudate Amount: Medium Exudate Type: Serosanguineous Exudate Color: red, brown Foul Odor After Cleansing: No Slough/Fibrino Yes Wound Bed Granulation Amount: Medium (34-66%) Exposed Structure Granulation Quality: Red, Pink Fascia Exposed: No Necrotic Amount: Medium (34-66%) Fat Layer (Subcutaneous Tissue) Exposed: Yes Necrotic Quality: Adherent Slough Tendon Exposed: No Muscle Exposed: No Joint Exposed: No Bone Exposed: No Treatment Notes Wound #1 (Lower Leg) Wound Laterality: Left, Medial Cleanser Wound Cleanser Discharge Instruction: Wash your hands with soap and water. Remove old dressing, discard into plastic bag and place into trash. Cleanse the wound with Wound Cleanser prior to applying a clean dressing using gauze sponges, not tissues or cotton balls. Do not scrub or use excessive force. Pat dry using gauze sponges, not tissue or cotton balls. Laura Santiago, Laura Santiago ([id number]) Peri-Wound Care Nystatin Cream USP 30 (g) Discharge Instruction: Use Nystatin Cream as directed. MIx 1:1 with TCA cream and applied to peri wound Triamcinolone Acetonide Cream, 0.1%, 15 (g) tube Discharge Instruction: Apply as directed. mix 1:1 with nystatin cream for peri wound Topical Primary Dressing Aquacel Extra Hydrofiber Dressing, 4x5 (in/in) keystone compound Discharge Instruction: apply to wound bed  as directed on medication label PLEASE follow direction on pill bottle for proper mixing Secondary Dressing Zetuvit Absorbent Pad, 4x8 (in/in) Secured With Compression Wrap 3-LAYER WRAP - Profore Lite LF 3 Multilayer Compression Bandaging System Discharge Instruction: Apply 3 multi-layer wrap as prescribed. Compression Stockings Add-Ons Electronic Signature(s) Signed: 09/06/2022 5:32:28 PM By: Gretta Cool, BSN, RN, CWS, [name] RN, BSN Signed: 09/08/2022 12:10:36 PM By: [name] Entered By: [name] on 09/06/2022 11:00:50 Laura Santiago, Laura Santiago ([id number]) -------------------------------------------------------------------------------- Wound Assessment Details Patient Name: Laura Santiago, Laura Santiago. Date of Service: 09/06/2022 10:30 AM Medical Record Number: [id number] Patient Account Number: [id number] Date of Birth/Sex: September 06, 1946 (76 y.o. F) Treating RN: Cornell Barman Primary Care Ivory Maduro: [facility], [place] Other Clinician: [name] Referring Ameen Mostafa: [facility] Treating Akeila Lana/Extender: Yaakov Guthrie in Treatment: 61 Wound Status Wound Number: 7 Primary Cellulitis Etiology: Wound Location: Right, Anterior Lower Leg Wound Status: Open Wounding Event: Not Known Comorbid Hypertension, Osteoarthritis, Received Chemotherapy, Date Acquired: 08/28/2022 History: Received Radiation Weeks Of Treatment: 1 Clustered Wound: No Photos Wound Measurements Length: (cm) 0.1 Width: (cm) 0.1 Depth: (cm) 0.1 Area: (cm) 0.008 Volume: (cm) 0.001 % Reduction in Area: 100% % Reduction in Volume: 100% Epithelialization: None Wound Description Classification: Partial Thickness Wound Margin: Flat and Intact Exudate Amount: Medium Exudate Type: Serosanguineous Exudate Color: red, brown Foul Odor After Cleansing: No Slough/Fibrino No Wound Bed Granulation Amount: None Present (0%) Treatment Notes Wound #7 (Lower Leg) Wound Laterality: Right, Anterior Cleanser Wound  Cleanser Discharge Instruction: Wash your hands with soap and water. Remove old dressing, discard into plastic bag and place into trash. Cleanse the wound with Wound Cleanser prior to applying a clean dressing using gauze sponges, not tissues or cotton balls. Do not scrub or use excessive force. Pat dry using gauze sponges, not tissue or cotton balls. Peri-Wound Care Nystatin Cream USP 30 (g) Discharge Instruction: Use Nystatin Cream as directed. MIx 1:1 with TCA cream and applied to peri wound Triamcinolone Acetonide Cream, 0.1%, 15 (g) tube VEARL, AITKEN ([id number]) Discharge Instruction: Apply as directed. mix 1:1 with nystatin cream for peri wound Topical Primary Dressing Aquacel Extra Hydrofiber Dressing, 4x5 (in/in) keystone compound Discharge Instruction: apply to wound bed as directed on medication label PLEASE follow direction on pill  bottle for proper mixing Secondary Dressing Zetuvit Absorbent Pad, 4x8 (in/in) Secured With Compression Wrap 3-LAYER WRAP - Profore Lite LF 3 Multilayer Compression Bandaging System Discharge Instruction: Apply 3 multi-layer wrap as prescribed. Compression Stockings Add-Ons Electronic Signature(s) Signed: 09/06/2022 5:32:28 PM By: Gretta Cool, BSN, RN, CWS, [name] RN, BSN Signed: 09/08/2022 12:10:36 PM By: [name] Entered By: [name] on 09/06/2022 11:01:15 JOUA, BAKE ([id number]) -------------------------------------------------------------------------------- Vitals Details Patient Name: AZARIAH, LATENDRESSE. Date of Service: 09/06/2022 10:30 AM Medical Record Number: [id number] Patient Account Number: [id number] Date of Birth/Sex: 07/20/1946 (76 y.o. F) Treating RN: Cornell Barman Primary Care Delores Edelstein: [facility], [place] Other Clinician: [name] Referring Lorianne Malbrough: [facility] Treating Farron Watrous/Extender: Yaakov Guthrie in Treatment: 60 Vital Signs Time Taken: 10:45 Temperature (F): 98.6 Height (in):  66 Pulse (bpm): 87 Weight (lbs): 153 Respiratory Rate (breaths/min): 18 Body Mass Index (BMI): 24.7 Blood Pressure (mmHg): 167/90 Reference Range: 80 - 120 mg / dl Electronic Signature(s) Signed: 09/08/2022 12:10:36 PM By: [name] Entered By: [name] on 09/06/2022 10:47:57

## 2022-09-11 ENCOUNTER — Ambulatory Visit: Payer: Medicare Other

## 2022-09-11 ENCOUNTER — Encounter: Payer: Self-pay | Admitting: Oncology

## 2022-09-12 ENCOUNTER — Ambulatory Visit
Admission: RE | Admit: 2022-09-12 | Discharge: 2022-09-12 | Disposition: A | Discharge status: Home / Self Care | Payer: Medicare Other | Source: Ambulatory Visit | Attending: Oncology | Admitting: Oncology

## 2022-09-12 DIAGNOSIS — C50919 Malignant neoplasm of unspecified site of unspecified female breast: Secondary | ICD-10-CM | POA: Diagnosis present

## 2022-09-12 DIAGNOSIS — C7951 Secondary malignant neoplasm of bone: Secondary | ICD-10-CM | POA: Diagnosis present

## 2022-09-12 DIAGNOSIS — I340 Nonrheumatic mitral (valve) insufficiency: Secondary | ICD-10-CM | POA: Insufficient documentation

## 2022-09-12 DIAGNOSIS — L03115 Cellulitis of right lower limb: Secondary | ICD-10-CM | POA: Insufficient documentation

## 2022-09-12 DIAGNOSIS — Z7189 Other specified counseling: Secondary | ICD-10-CM | POA: Diagnosis not present

## 2022-09-12 DIAGNOSIS — I10 Essential (primary) hypertension: Secondary | ICD-10-CM | POA: Diagnosis not present

## 2022-09-12 MED ORDER — GADOPICLENOL 0.5 MMOL/ML IV SOLN
6.0000 mL | Freq: Once | INTRAVENOUS | Status: AC | PRN
  Administered 2022-09-12: 6 mL via INTRAVENOUS

## 2022-09-13 ENCOUNTER — Ambulatory Visit (HOSPITAL_BASED_OUTPATIENT_CLINIC_OR_DEPARTMENT_OTHER)
Admission: RE | Admit: 2022-09-13 | Discharge: 2022-09-13 | Disposition: A | Discharge status: Home / Self Care | Payer: Medicare Other | Source: Ambulatory Visit | Attending: Oncology | Admitting: Oncology

## 2022-09-13 ENCOUNTER — Encounter: Payer: Medicare Other | Attending: Internal Medicine | Admitting: Internal Medicine

## 2022-09-13 DIAGNOSIS — L03115 Cellulitis of right lower limb: Secondary | ICD-10-CM | POA: Diagnosis not present

## 2022-09-13 DIAGNOSIS — Z7189 Other specified counseling: Secondary | ICD-10-CM

## 2022-09-13 DIAGNOSIS — N184 Chronic kidney disease, stage 4 (severe): Secondary | ICD-10-CM | POA: Diagnosis not present

## 2022-09-13 DIAGNOSIS — I129 Hypertensive chronic kidney disease with stage 1 through stage 4 chronic kidney disease, or unspecified chronic kidney disease: Secondary | ICD-10-CM | POA: Diagnosis not present

## 2022-09-13 DIAGNOSIS — L97822 Non-pressure chronic ulcer of other part of left lower leg with fat layer exposed: Secondary | ICD-10-CM | POA: Diagnosis present

## 2022-09-13 DIAGNOSIS — I87313 Chronic venous hypertension (idiopathic) with ulcer of bilateral lower extremity: Secondary | ICD-10-CM | POA: Insufficient documentation

## 2022-09-13 DIAGNOSIS — I10 Essential (primary) hypertension: Secondary | ICD-10-CM | POA: Diagnosis not present

## 2022-09-13 DIAGNOSIS — F909 Attention-deficit hyperactivity disorder, unspecified type: Secondary | ICD-10-CM | POA: Diagnosis not present

## 2022-09-13 DIAGNOSIS — C50919 Malignant neoplasm of unspecified site of unspecified female breast: Secondary | ICD-10-CM | POA: Diagnosis not present

## 2022-09-13 DIAGNOSIS — Z7901 Long term (current) use of anticoagulants: Secondary | ICD-10-CM | POA: Diagnosis not present

## 2022-09-13 DIAGNOSIS — I87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: Secondary | ICD-10-CM

## 2022-09-13 DIAGNOSIS — C79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: Secondary | ICD-10-CM | POA: Diagnosis not present

## 2022-09-13 DIAGNOSIS — I872 Venous insufficiency (chronic) (peripheral): Secondary | ICD-10-CM | POA: Diagnosis not present

## 2022-09-13 LAB — ECHOCARDIOGRAM COMPLETE
AR max vel: 1.83 cm2
AV Area VTI: 1.79 cm2
AV Area mean vel: 1.8 cm2
AV Mean grad: 8 mmHg
AV Peak grad: 15.2 mmHg
Ao pk vel: 1.95 m/s
Area-P 1/2: 3.27 cm2
Calc EF: 54.2 %
S' Lateral: 3.9 cm
Single Plane A2C EF: 56.8 %
Single Plane A4C EF: 53.1 %

## 2022-09-13 NOTE — Progress Notes (Signed)
*  PRELIMINARY RESULTS* Echocardiogram 2D Echocardiogram has been performed.  Laura Santiago Kleppe 09/13/2022, 11:18 AM

## 2022-09-17 IMAGING — MR MR HEAD WO/W CM
16 series · 48 of 48 positions shown · IV contrast (gadavist)
Comparison: None.

CLINICAL DATA: Metastatic breast cancer.

EXAM:
MRI HEAD WITHOUT AND WITH CONTRAST
TECHNIQUE: Multiplanar, multiecho pulse sequences of the brain and surrounding
structures were obtained without and with intravenous contrast.
CONTRAST:  7mL GADAVIST GADOBUTROL 1 MMOL/ML IV SOLN

[Series 5: ax dwi_tracew · axial · 3.0mm · 0.65mm/px · z∈[-86,+69]mm · 3 of 48 slices shown]
[im 1/48]
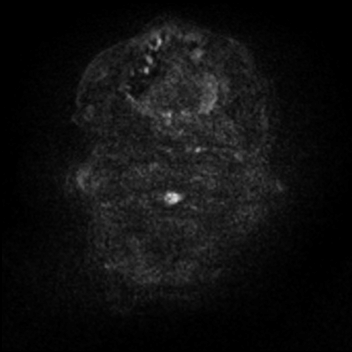
[im 24/48]
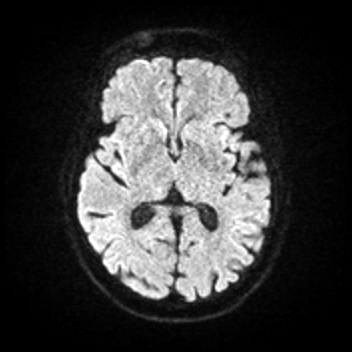
[im 48/48]
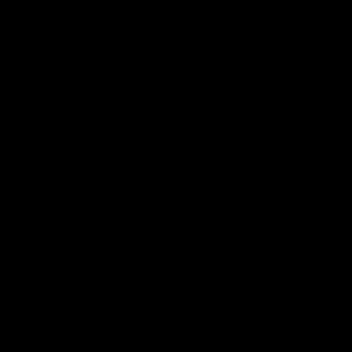

[Series 6: ax dwi_adc · axial · 3.0mm · 0.65mm/px · z∈[-86,+66]mm · 3 of 47 slices shown]
[im 1/47]
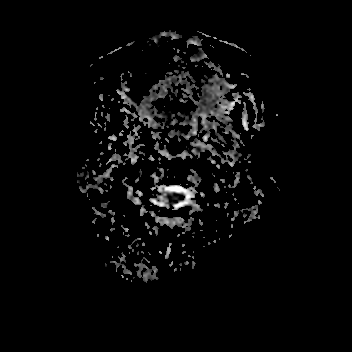
[im 24/47]
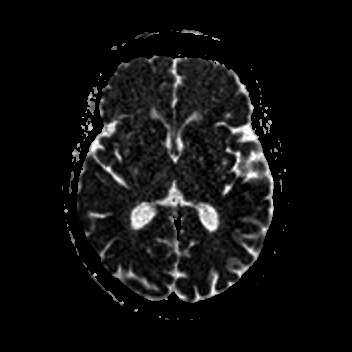
[im 47/47]
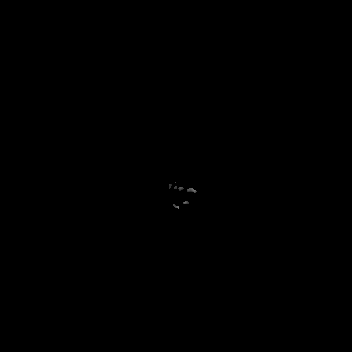

[Series 7: cor dwi_tracew · coronal · 5.0mm · 0.65mm/px · 2 of 40 slices shown]
[im 1/40]
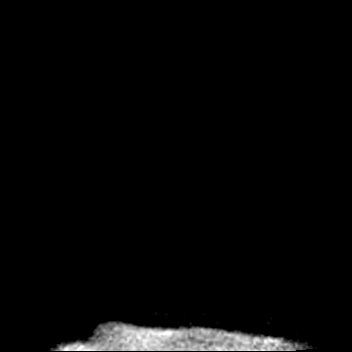
[im 40/40]
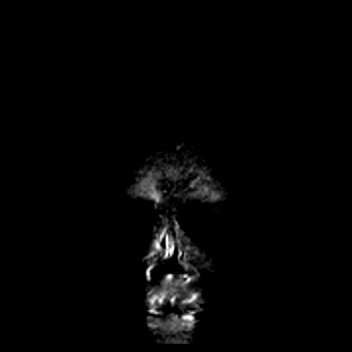

[Series 8: cor dwi_adc · coronal · 5.0mm · 0.65mm/px · 2 of 38 slices shown]
[im 1/38]
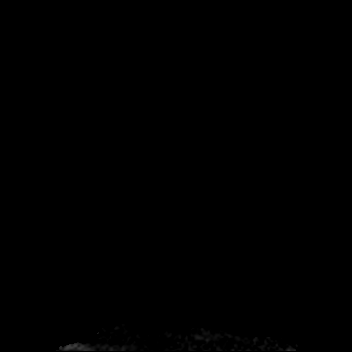
[im 38/38]
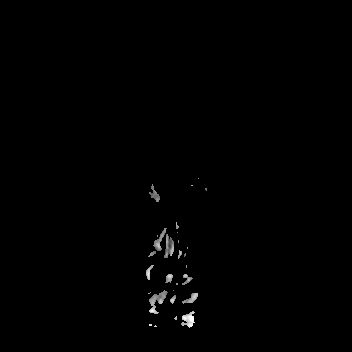

[Series 9: T1 · sagittal · 5.0mm · 0.62mm/px · 1 of 25 slices shown (1 of 2)]
[im 1/25]
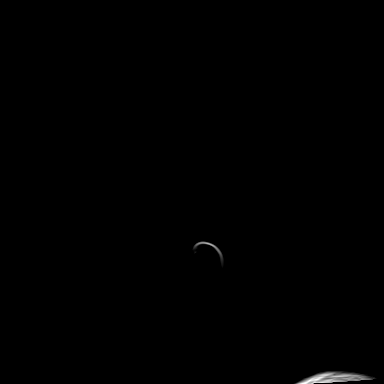

[Series 10: T2 · axial · 5.0mm · 0.53mm/px · 1 of 25 slices shown]
[im 1/25]
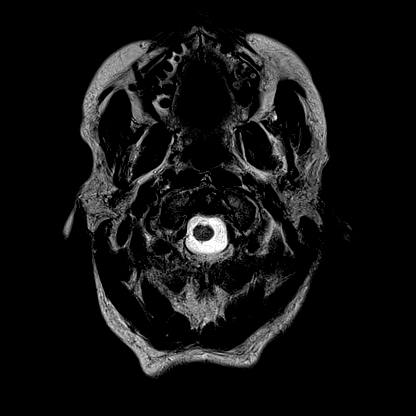

[Series 11: mag_images · axial · 3.0mm · 0.90mm/px · z∈[-97,+80]mm · 3 of 60 slices shown]
[im 1/60]
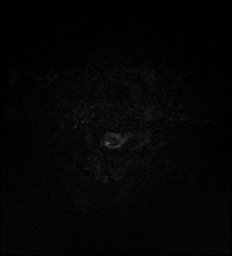
[im 30/60]
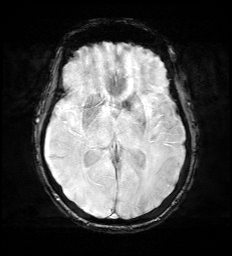
[im 60/60]
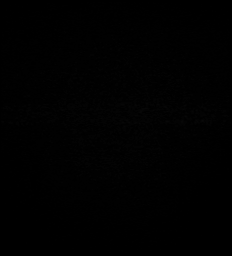

[Series 12: pha_images · axial · 3.0mm · 0.90mm/px · z∈[-91,+80]mm · 3 of 56 slices shown]
[im 1/56]
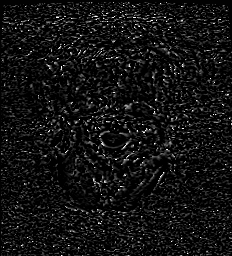
[im 28/56]
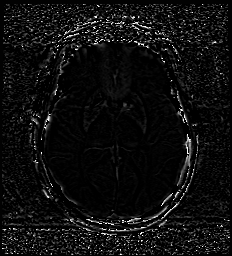
[im 56/56]
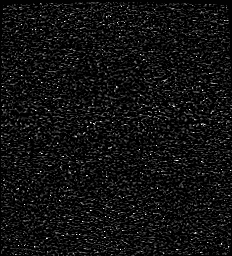

[Series 13: swi_images · axial · 3.0mm · 0.90mm/px · z∈[-97,+80]mm · 3 of 60 slices shown]
[im 1/60]
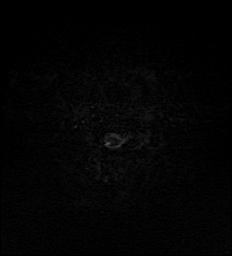
[im 30/60]
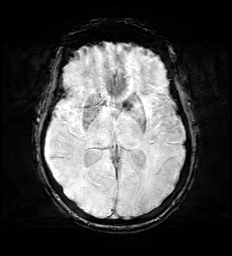
[im 60/60]
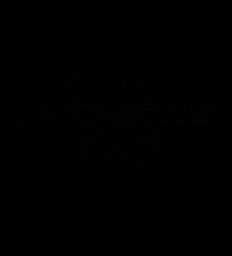

[Series 14: mip_images(sw) · axial · 24.0mm · 0.90mm/px · z∈[-87,+69]mm · 3 of 53 slices shown]
[im 1/53]
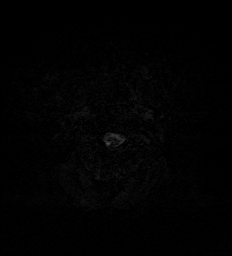
[im 27/53]
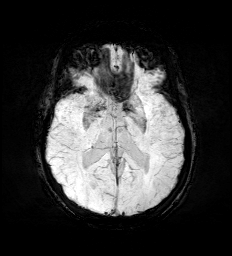
[im 53/53]
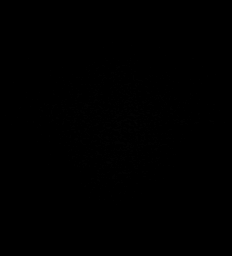

[Series 15: FLAIR · axial · 3.0mm · 0.53mm/px · z∈[-90,+72]mm · 3 of 55 slices shown]
[im 1/55]
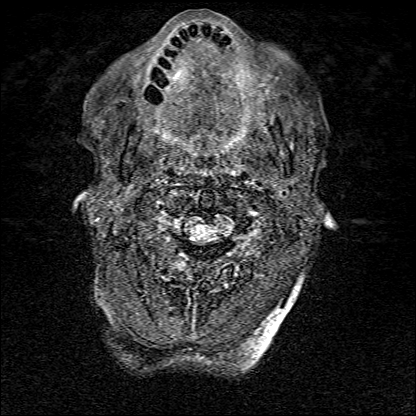
[im 28/55]
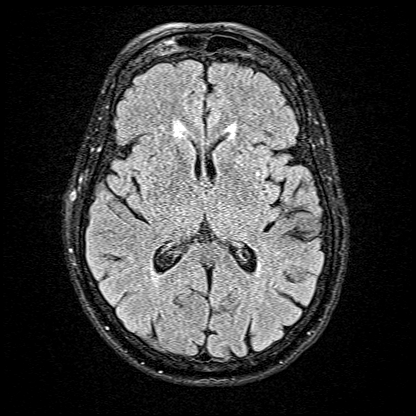
[im 55/55]
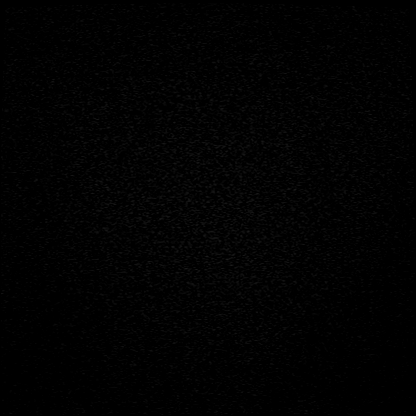

[Series 16: T1 · axial · 1.0mm · 0.98mm/px · z∈[-95,+79]mm · 9 of 176 slices shown (2 of 2)]
[im 1/176]
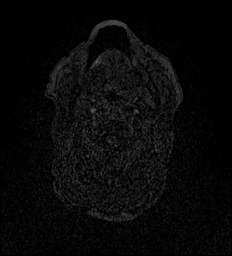
[im 22/176]
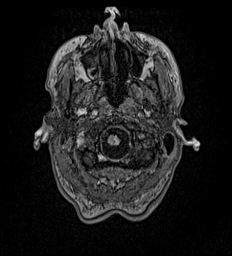
[im 44/176]
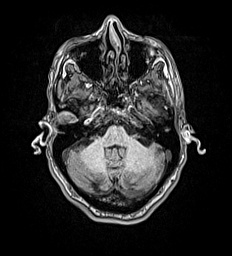
[im 66/176]
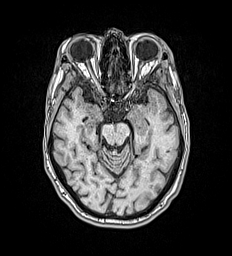
[im 88/176]
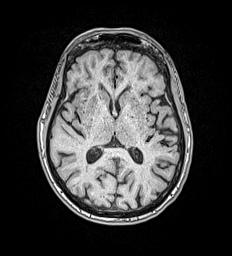
[im 110/176]
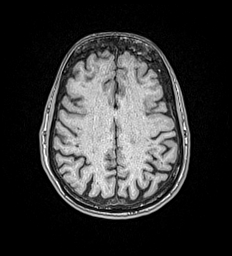
[im 132/176]
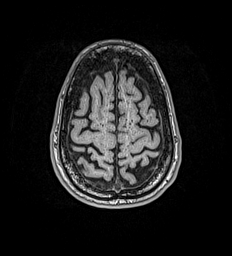
[im 154/176]
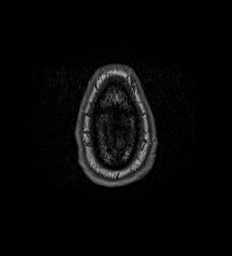
[im 176/176]
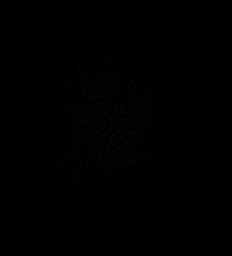

[Series 17: T2 post-contrast · coronal · 5.0mm · 0.57mm/px · 1 of 29 slices shown]
[im 1/29]
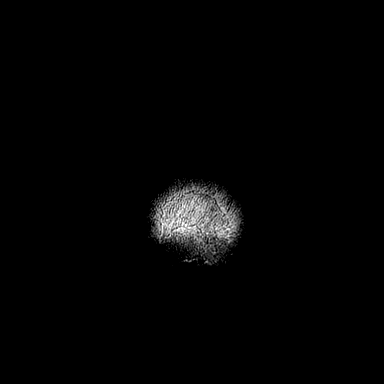

[Series 18: T1 post-contrast · axial · 1.0mm · 0.98mm/px · z∈[-95,+79]mm · 9 of 176 slices shown (1 of 3)]
[im 1/176]
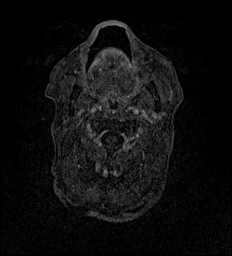
[im 22/176]
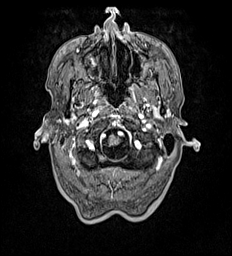
[im 44/176]
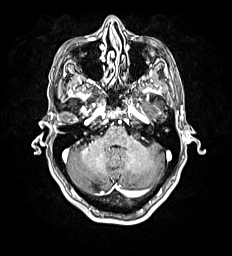
[im 66/176]
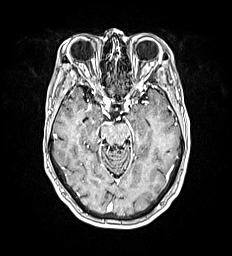
[im 88/176]
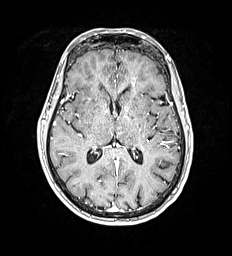
[im 110/176]
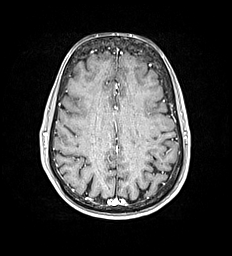
[im 132/176]
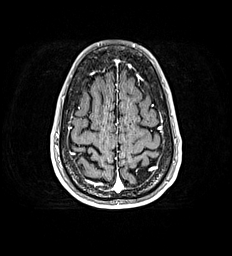
[im 154/176]
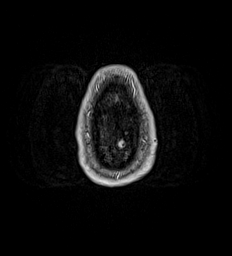
[im 176/176]
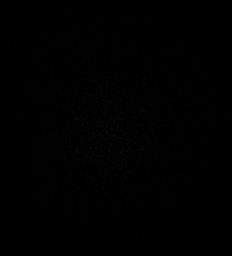

[Series 19: T1 post-contrast · coronal · 5.0mm · 0.57mm/px · 1 of 29 slices shown (2 of 3)]
[im 1/29]
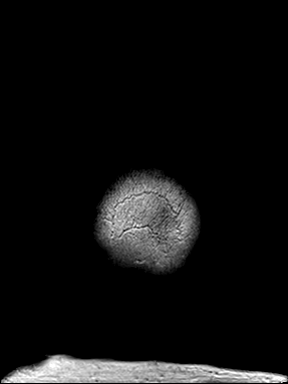

[Series 20: T1 post-contrast · sagittal · 5.0mm · 0.62mm/px · 1 of 25 slices shown (3 of 3)]
[im 1/25]
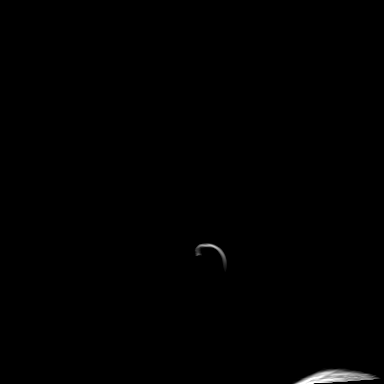

[48 of 48 positions shown; findings below may reference images not displayed]

FINDINGS: Brain: There is no evidence of an acute infarct, intracranial
hemorrhage, mass, midline shift, or extra-axial fluid collection.
The ventricles and sulci are normal. Small T2 hyperintensities in
the cerebral white matter bilaterally are nonspecific but compatible
with mild chronic small vessel ischemic disease. No abnormal
enhancement is identified.

Vascular: Major intracranial vascular flow voids are preserved.

Skull and upper cervical spine: Hyperostosis frontalis. No
suspicious marrow lesion.

Sinuses/Orbits: Bilateral cataract extraction. Minimal mucosal
thickening in the right maxillary sinus. Clear mastoid air cells.

Other: None.
IMPRESSION: 1. No evidence of intracranial metastases.
2. Mild chronic small vessel ischemic disease.

## 2022-09-18 NOTE — Progress Notes (Signed)
Laura Santiago ([id number]) Visit Report for 09/13/2022 Chief Complaint Document Details Patient Name: Laura Santiago, Laura Santiago. Date of Service: 09/13/2022 9:00 AM Medical Record Number: [id number] Patient Account Number: [id number] Date of Birth/Sex: [birth year] (76 y.o. F) Treating RN: Cornell Barman Primary Care Provider: Hilda Lias, [place] Other Clinician: [name] Referring Provider: [facility] Treating Provider/Extender: Yaakov Guthrie in Treatment: 38 Information Obtained from: Patient Chief Complaint Left lower extremity wound Right toe wounds Left upper lateral thigh wounds Electronic Signature(s) Signed: 09/13/2022 2:44:12 PM By: Kalman Shan DO Entered By: Kalman Shan on 09/13/2022 09:51:45 Laura Santiago ([id number]) -------------------------------------------------------------------------------- Debridement Details Patient Name: Laura Santiago. Date of Service: 09/13/2022 9:00 AM Medical Record Number: [id number] Patient Account Number: [id number] Date of Birth/Sex: [birth year] (76 y.o. F) Treating RN: Cornell Barman Primary Care Provider: Hilda Lias, [place] Other Clinician: [name] Referring Provider: [facility] Treating Provider/Extender: Yaakov Guthrie in Treatment: 62 Debridement Performed for Wound #1 Left,Medial Lower Leg Assessment: Performed By: Physician Kalman Shan, MD Debridement Type: Debridement Severity of Tissue Pre Debridement: Fat layer exposed Level of Consciousness (Pre- Awake and Alert procedure): Pre-procedure Verification/Time Out Yes - 09:41 Taken: Start Time: 09:41 Total Area Debrided (L x W): 4 (cm) x 4 (cm) = 16 (cm) Tissue and other material Viable, Non-Viable, Slough, Subcutaneous, Slough debrided: Level: Skin/Subcutaneous Tissue Debridement Description: Excisional Instrument: Curette Bleeding: Minimum Hemostasis Achieved: Pressure End Time: 09:47 Response to  Treatment: Procedure was tolerated well Level of Consciousness (Post- Awake and Alert procedure): Post Debridement Measurements of Total Wound Length: (cm) 6.5 Width: (cm) 10.7 Depth: (cm) 0.2 Volume: (cm) 10.925 Character of Wound/Ulcer Post Debridement: Stable Severity of Tissue Post Debridement: Fat layer exposed Post Procedure Diagnosis Same as Pre-procedure Electronic Signature(s) Signed: 09/13/2022 2:41:22 PM By: Gretta Cool, BSN, RN, CWS, [name] RN, BSN Signed: 09/13/2022 2:44:12 PM By: Kalman Shan DO Signed: 09/18/2022 4:50:08 PM By: [name] Entered By: [name] on 09/13/2022 09:47:17 Laura Santiago ([id number]) -------------------------------------------------------------------------------- HPI Details Patient Name: Laura Santiago. Date of Service: 09/13/2022 9:00 AM Medical Record Number: [id number] Patient Account Number: [id number] Date of Birth/Sex: July 22, 1946 (76 y.o. F) Treating RN: Cornell Barman Primary Care Provider: Hilda Lias, [place] Other Clinician: [name] Referring Provider: [facility] Treating Provider/Extender: Yaakov Guthrie in Treatment: 62 History of Present Illness HPI Description: Admission 7/27 Laura Santiago is a 76 year old female with a past medical history of ADHD, metastatic breast cancer, stage IV chronic kidney disease, history of DVT on Xarelto and chronic venous insufficiency that presents to the clinic for a chronic left lower extremity wound. She recently moved to [facility] 4 days ago. She was being followed by wound care center in [name]. She reports a 10-year history of wounds to her left lower extremity that eventually do heal with debridement and compression therapy. She states that the current wound reopened 4 months ago and she is using Vaseline and Coban. She denies signs of infection. 8/3; patient presents for 1 week follow-up. She reports no issues or complaints today. She  states she had vascular studies done in the last week. She denies signs of infection. She brought her little service dog with her today. 8/17; patient presents for follow-up. She has missed her last clinic appointment. She states she took the wrap off and attempted to rewrap her leg. She is having difficulty with transportation. She has her service dog with her today. Overall she feels well and reports improvement in wound healing. She denies signs  of infection. She reports owning an old Velcro wrap compression and has this at her living facility 9/14; patient presents for follow-up. Patient states that over the past 2 to 3 weeks she developed toe wounds to her right foot. She attributes this to tight fitting shoes. She subsequently developed cellulitis in the right leg and has been treated by doxycycline by her oncologist. She reports improvement in symptoms however continues to have some redness and swelling to this leg. To the left lower extremity patient has been having her wraps changed with home health twice weekly. She states that the [facility] is not helping control the drainage. Other than that she has no issues or complaints today. She denies signs of infection to the left lower extremity. 9/21; patient presents for follow-up. She reports seeing infectious disease for her cellulitis. She reports no further management. She has home health that changes the wraps twice weekly. She has no issues or complaints today. She denies signs of infection. 10/5; patient presents for follow-up. She has no issues or complaints today. She denies signs of infection. She states that the right great toe has not been dressed by home health. 10/12; patient presents for follow-up. She has no issues or complaints today. She reports improvement in her wound healing. She has been using silver alginate to the right great toe wound. She denies signs of infection. 10/26; patient presents for follow-up. Home health  did not have sorbact so they continued to use Hydrofera Blue under the wrap. She has been using silver alginate to the great toe wound however she did not have a dressing in place today. She currently denies signs of infection. 11/2; patient presents for follow-up. She has been using sorb act under the compression wrap. She reports using silver alginate to the toe wound again she does not have a dressing in place. She currently denies signs of infection. 11/23; patient presents for follow-up. Unfortunately she has missed her last 2 clinic appointments. She was last seen 3 weeks ago. She did her own compression wrap with Kerlix and Coban yesterday after seeing vein and vascular. She has not been dressing her right great toe wound. She currently denies signs of infection. 11/30; patient presents for 1 week follow-up. She states she changed her dressing last week prior to home health and use sorb act with Dakin's and Hydrofera Blue. Home health has changed the dressing as well and they have been using sorbact. Today she reports increased redness to her right lower extremity. She has a history of cellulitis to this leg. She has been using silver alginate to the right great toe. Unfortunately she had an episode of diarrhea prior to coming in and had feces all over the right leg and to the wrap of her left leg. 12/7; patient presents for 1 week follow-up. She states that home health did not come out to change the dressing and she took it off yesterday. It is unclear if she is dressing the right toe wound. She denies signs of infection. 12/14; patient presents for 1 week follow-up. She has no issues or complaints today. 12/21; patient presents for follow-up. She has no issues or complaints today. She denies signs of infection. 12/28/2021; patient presents for follow-up. She was hospitalized for sepsis secondary to right lower extremity cellulitis On 12/23. She states she is currently at a SNF. She states  that she was started on doxycycline this morning for her right great toe swelling and redness. She is not sure what dressings  have been done to her left lower extremity for the past 3 weeks. She says its been mainly gauze with an Ace wrap. 1/25; patient presents for follow-up. She is still residing in a skilled nursing facility. She reports mild pain to the left lower extremity wound bed. She states she is going to see a podiatrist soon. 2/8; patient presents for follow-up. She has moved back to her residential community from her skilled nursing facility. She has no issues or complaints today. She denies signs of systemic infections. 2/15; patient presents for follow-up. He has no issues or complaints today. She denies systemic signs of infection. 2/22; patient presents for follow-up. She has no issues or complaints today. She denies signs of infection. Laura Santiago, Laura Santiago ([id number]) 3/1; patient presents for follow-up. She states that home health came out the day after she was seen in our clinic and yesterday to do the wrap change. She denies signs of infection. She reports excoriated skin on the ankle. 3/8; patient presents for follow-up. She has no issues or complaints today. She denies signs of infection. 3/15; patient presents for follow-up. Home health has been coming out to change the dressings. She reports more tenderness to the wound site. She denies purulent drainage, increased warmth or erythema to the area. 4/5; patient presents for follow-up. She has missed her last 2 clinic appointments. I have not seen her in 3 weeks. She was recently hospitalized for altered mental status. She was involuntarily committed. She was evaluated by psychiatry and deemed to have competency. There was no specific cause of her altered mental status. It was concluded that her physical and mental health were declining due to her chronic medical conditions. Currently home health has been coming out for dressing  changes. Patient has also been doing her own dressing changes. She reports more skin breakdown to the periwound and now has a new wound. She denies fever/chills. She reports continued tenderness to the wound site. 4/12; patient with significant venous insufficiency and a large wound on her left lower leg taking up about 80% of the circumference of her lower leg. Cultures of this grew MRSA and Pseudomonas. She had completed a course of ciprofloxacin now is starting doxycycline. She has been using Dakin's wet-to-dry and a Tubigrip. She has home health twice a week and we change it once. 4/19; patient presents for follow-up. She completed her course of doxycycline. She has been using Dakin's wet-to-dry dressing and Tubigrip. Home health changes the dressing twice weekly. Currently she has no issues or complaints. 4/26; patient presents for follow-up. At last clinic visit orders for home health were Iodosorb under compression therapy. Unfortunately they did not have the dressing and have been using Dakin's and gentamicin under the wrap. Patient currently denies signs of infection. She has no issues or complaints today. 5/3; patient presents for follow-up. Again Iodosorb has not been used under the compression therapy when home health comes out to change the wrap and dressing. They have been using Sorbact. It is unclear why this is happening since we send orders weekly to the agency. She denies signs of infection. Patient has not purchased the [place] antibiotics. We reached out to the company and they said they have been trying to contact her on a regular basis. We gave the patient the number to call to order the medication. 5/10; patient presents for follow-up. She has no issues or complaints today. Again home health has not been using Iodosorb. Mepilex was on the wound bed. No other dressings  noted. She brought in her Keystone antibiotics. She denies signs of infection. 5/17; patient presents for  follow-up. Home health has come out twice since she was last seen. Joint well she has been using Keystone antibiotic with Sorbact under the compression wrap. She has no issues or complaints today. She denies signs of infection. 5/24; patient presents for follow-up. We have been using Keystone antibiotics with Sorbact under compression therapy. She is tolerating the treatment well. She is reporting improvement in wound healing. She denies signs of infection. 5/31; patient presents for follow-up. We continue to do [facility] antibiotics with Sorbact under compression therapy. She continues to report improvement in wound healing. Home health comes out and changes the dressing once weekly. 05-17-2022 upon evaluation today patient appears to be doing better in regard to her wound especially compared to the last time I saw her. Fortunately I do think that she is seeing improvements. With that being said I do believe that she may be benefit from sharp debridement today to clear away some of the necrotic debris I discussed that with her as well. She is an amendable to that plan. Otherwise she is very pleased with how the [employer] is doing for her. 6/14; patient presents for follow-up. We have been using Keystone antibiotic with Sorbact and absorbent dressings under 3 layer compression. She has no issues or complaints today. She reports improvement in wound healing. She denies signs of infection. 6/21; patient presents for follow-up. We are continuing with [facility] antibiotic and Sorbact under 3 layer compression. Patient has no complaints. Continued wound healing is happening. She denies signs of infection. 6/28; patient presents for follow-up. We have been using Keystone antibiotic with Sorbact under 3 layer compression. Usually home health comes out and changes the dressing twice a week. Unfortunately they did not go out to change the dressing. It is unclear why. Patient did not call them. She currently denies  signs of infection. 7/5; patient presents for follow-up. We have been using Keystone antibiotic with calcium alginate under 3 layer compression. She reports improvement in wound healing. She denies signs of infection. Home health has come out to do dressing changes twice this past week. 7/12; patient presents for follow-up. We have been using Keystone antibiotic with calcium alginate under 3 layer compression. Patient states that home health came out once last week to change the dressing. She reports improvement in wound healing. She currently denies signs of infection. 7/19; patient presents for follow-up. We have been using Keystone antibiotic with calcium alginate under 3 layer compression. Home health came out once last week to change the dressing. She has no issues or complaints today. She denies signs of infection. 8/2; patient presents for follow-up. We have been using Keystone antibiotic with calcium alginate under 3 layer compression. Unfortunately she missed her appointment last week and home health did not come out to do dressing changes. Patient currently denies signs of infection. 8/9; patient presents for follow-up. We have been using Keystone with calcium alginate under 3 layer compression. She states that home health came out once last week. She currently denies signs of infection. Her wrap was completely wet. She states she was cleaning the top of the leg and water soaked down into the wrap. 8/16; patient presents for follow-up. We have been using Keystone with calcium alginate under 3 layer compression. She states that home health came out twice last week. She has no issues or complaints today. 8/23; patient presents for follow-up. He has been using [place] with  calcium alginate under 3 layer compression. Home health came out twice last week. She denies signs of infection. 8/30; patient presents for follow-up. We have been using Keystone with calcium alginate under 3 layer  compression. Home health came out once DIERRA, RIESGO. ([id number]) last week to change the dressing. Patient reports improvement in wound healing. She states she is almost done with her chemotherapy infusions and has 1 more left. 9/13; patient presents for follow-up. She has lost the capsules to her [facility] antibiotic which I believe is the vancomycin pills. She has her Zosyn powder today. We have been using Keystone antibiotic ointment with calcium alginate under 3 layer compression. She is concerned about systemic infection however her vitals are stable and there is no surrounding soft tissue infection. She would like to remain a patient in our wound care center however would like a second opinion for her wound care at another facility. She asked to be referred to [facility] wound care center. 9/20; patient presents for follow-up. She found her vancomycin capsules and brought in her complete Keystone antibiotic ointment set today. Unfortunately she has developed skin breakdown and Erythema to the right lower extremity With increased swelling. She states she went to a pow wow Over the weekend and was on her feet for extended periods of time. She saw her oncologist yesterday who prescribed her doxycycline for her right lower extremity erythema. 9/27; patient presents for follow-up. We have been using Keystone antibiotic with Aquacel under 3 layer compression to the lower extremities bilaterally. When home health came and changed the wrap she secretly put coffee into the spray mix along with [facility] antibiotic on her leg thinking the acidic component would better activate the zoysn (sonething she discussed with her microbiologist brother). She has reported improvement in wound healing. 10/4; patient presents for follow-up. She has no issues or complaints today. We have been doing Aquacel and keystone under 3 layer compression to the lower extremities bilaterally. This morning she took the right lower  extremity wrap off as it was uncomfortable. She has no open wounds to this leg. Electronic Signature(s) Signed: 09/13/2022 2:44:12 PM By: Kalman Shan DO Entered By: Kalman Shan on 09/13/2022 10:00:48 Laura Santiago ([id number]) -------------------------------------------------------------------------------- Physical Exam Details Patient Name: MAZIYAH, VESSEL. Date of Service: 09/13/2022 9:00 AM Medical Record Number: [id number] Patient Account Number: [id number] Date of Birth/Sex: Nov 25, 1946 (76 y.o. F) Treating RN: Cornell Barman Primary Care Provider: Hilda Lias, [place] Other Clinician: [name] Referring Provider: [facility] Treating Provider/Extender: Yaakov Guthrie in Treatment: 74 Constitutional . Cardiovascular . Psychiatric . Notes Left lower extremity: Open wound with granulation tissue and nonviable tissue. No signs of surrounding soft tissue infection. Right lower extremity: no open wounds, 2+ pitting edema. Electronic Signature(s) Signed: 09/13/2022 2:44:12 PM By: Kalman Shan DO Entered By: Kalman Shan on 09/13/2022 10:01:48 Laura Santiago ([id number]) -------------------------------------------------------------------------------- Physician Orders Details Patient Name: Laura Santiago. Date of Service: 09/13/2022 9:00 AM Medical Record Number: [id number] Patient Account Number: [id number] Date of Birth/Sex: 12/25/1945 (76 y.o. F) Treating RN: Cornell Barman Primary Care Provider: Hilda Lias, [place] Other Clinician: [name] Referring Provider: [facility] Treating Provider/Extender: Yaakov Guthrie in Treatment: 54 Verbal / Phone Orders: No Diagnosis Coding Follow-up Appointments o Return Appointment in 1 week. o Nurse Visit as needed [place] for wound care. May utilize formulary equivalent dressing for wound treatment  orders unless otherwise specified. Home Health Nurse may  visit PRN to address patientos wound care needs. o **Please direct any NON-WOUND related issues/requests for orders to patient's Primary Care Physician. **If current dressing causes regression in wound condition, may D/C ordered dressing product/s and apply Normal Saline Moist Dressing daily until next [place] or Other MD appointment. **Notify Wound Healing Center of regression in wound condition at [phone]. o Other Home Health Orders/Instructions: - Dressing change 3 x weekly, twice by home health and once at wound clinic weekly. PLEASE make sure frequency between dressing changes is appropriate. Bathing/ Shower/ Hygiene o May shower with wound dressing protected with water repellent cover or cast protector. o No tub bath. Anesthetic (Use 'Patient Medications' Section for Anesthetic Order Entry) o Lidocaine applied to wound bed Edema Control - Lymphedema / Segmental Compressive Device / Other o Optional: One layer of unna paste to top of compression wrap (to act as an anchor). - PLEASE when applying wrap start from toes and go up to just below knee o 3 Layer Compression System for Lymphedema. - left lower leg o Tubigrip single layer applied. - Tubi D single right leg o Elevate, Exercise Daily and Avoid Standing for Long Periods of Time. o Elevate legs to the level of the heart and pump ankles as often as possible o Elevate leg(s) parallel to the floor when sitting. o DO YOUR BEST to sleep in the bed at night. DO NOT sleep in your recliner. Long hours of sitting in a recliner leads to swelling of the legs and/or potential wounds on your backside. Additional Orders / Instructions o Follow Nutritious Diet and Increase Protein Intake Medications-Please add to medication list. o Keystone Compound Wound Treatment Wound #1 - Lower Leg Wound Laterality: Left, Medial Cleanser: Wound Cleanser  (Home Health) 3 x Per Week/30 Days Discharge Instructions: Wash your hands with soap and water. Remove old dressing, discard into plastic bag and place into trash. Cleanse the wound with Wound Cleanser prior to applying a clean dressing using gauze sponges, not tissues or cotton balls. Do not scrub or use excessive force. Pat dry using gauze sponges, not tissue or cotton balls. Peri-Wound Care: Nystatin Cream USP 30 (g) 3 x Per Week/30 Days Discharge Instructions: Use Nystatin Cream as directed. MIx 1:1 with TCA cream and applied to peri wound Peri-Wound Care: Triamcinolone Acetonide Cream, 0.1%, 15 (g) tube 3 x Per Week/30 Days Discharge Instructions: Apply as directed. mix 1:1 with nystatin cream for peri wound Primary Dressing: Aquacel Extra Hydrofiber Dressing, 4x5 (in/in) 3 x Per Week/30 Days Primary Dressing: keystone compound 3 x Per Week/30 Days Discharge Instructions: apply to wound bed as directed on medication label PLEASE follow direction on pill bottle for proper mixing Laura Santiago, Laura Santiago ([id number]) Secondary Dressing: Zetuvit Absorbent Pad, 4x8 (in/in) 3 x Per Week/30 Days Compression Wrap: 3-LAYER WRAP - Profore Lite LF 3 Multilayer Compression Bandaging System 3 x Per Week/30 Days Discharge Instructions: Apply 3 multi-layer wrap as prescribed. Electronic Signature(s) Signed: 09/13/2022 2:44:12 PM By: Kalman Shan DO Signed: 09/18/2022 4:50:08 PM By: [name] Entered By: [name] on 09/13/2022 10:07:25 Laura Santiago ([id number]) -------------------------------------------------------------------------------- Problem List Details Patient Name: FAREEHA, EVON. Date of Service: 09/13/2022 9:00 AM Medical Record Number: [id number] Patient Account Number: [id number] Date of Birth/Sex: 1946-08-03 (76 y.o. 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 11/02/2021 No Yes exposed I87.312 Chronic venous hypertension (idiopathic) with ulcer of left lower 11/02/2021 No Yes extremity I87.311 Chronic venous hypertension (idiopathic) with ulcer of right lower 08/30/2022 No Yes extremity I87.2 Venous insufficiency (chronic) (peripheral) 07/06/2021 No Yes Z79.01 Long term (current) use of anticoagulants 07/06/2021 No Yes I10 Essential (primary) hypertension 07/06/2021 No Yes 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 07/06/2021 No Yes Inactive Problems ICD-10 Code Description Active Date Inactive Date S81.802A Unspecified open wound, left lower leg, initial encounter 07/06/2021 07/06/2021 S91.101A Unspecified open wound of right great toe without damage to nail, initial 08/24/2021 08/24/2021 encounter S91.104A Unspecified open wound of right lesser toe(s) without damage to nail, initial 08/24/2021 08/24/2021 encounter Resolved Problems ICD-10 Laura Santiago, Laura Santiago ([id number]) Code Description Active Date Resolved Date S91.104D Unspecified open wound of right lesser toe(s) without damage to nail, 08/31/2021 08/31/2021 subsequent encounter S91.201D Unspecified open wound of right great toe with damage to nail, subsequent 08/31/2021 08/31/2021 encounter Electronic Signature(s) Signed: 09/13/2022 2:44:12 PM By: Kalman Shan DO Entered By: Kalman Shan on 09/13/2022 09:50:27 Laura Santiago ([id number]) -------------------------------------------------------------------------------- Progress Note Details Patient Name: Laura Santiago. Date of Service: 09/13/2022 9:00 AM Medical Record Number: [id number] Patient Account Number: [id number] Date of Birth/Sex: 1946/07/16 (76 y.o. F) Treating RN: Cornell Barman Primary Care Provider:  [facility], [place] Other Clinician: [name] Referring Provider: [facility] Treating Provider/Extender: Yaakov Guthrie in Treatment: 78 Subjective Chief Complaint Information obtained from Patient Left lower extremity wound Right toe wounds Left upper lateral thigh wounds History of Present Illness (HPI) Admission 7/27 Ms. Heylee Tant is a 76 year old female with a past medical history of ADHD, metastatic breast cancer, stage IV chronic kidney disease, history of DVT on Xarelto and chronic venous insufficiency that presents to the clinic for a chronic left lower extremity wound. She recently moved to [facility] 4 days ago. She was being followed by wound care center in [name]. She reports a 10-year history of wounds to her left lower extremity that eventually do heal with debridement and compression therapy. She states that the current wound reopened 4 months ago and she is using Vaseline and Coban. She denies signs of infection. 8/3; patient presents for 1 week follow-up. She reports no issues or complaints today. She states she had vascular studies done in the last week. She denies signs of infection. She brought her little service dog with her today. 8/17; patient presents for follow-up. She has missed her last clinic appointment. She states she took the wrap off and attempted to rewrap her leg. She is having difficulty with transportation. She has her service dog with her today. Overall she feels well and reports improvement in wound healing. She denies signs of infection. She reports owning an old Velcro wrap compression and has this at her living facility 9/14; patient presents for follow-up. Patient states that over the past 2 to 3 weeks she developed toe wounds to her right foot. She attributes this to tight fitting shoes. She subsequently developed cellulitis in the right leg and has been treated by doxycycline by her oncologist. She  reports improvement in symptoms however continues to have some redness and swelling to this leg. To the left lower extremity patient has been having her wraps changed with home health twice weekly. She states that the [facility] is not helping control the drainage. Other than that she has no issues or complaints today. She  denies signs of infection to the left lower extremity. 9/21; patient presents for follow-up. She reports seeing infectious disease for her cellulitis. She reports no further management. She has home health that changes the wraps twice weekly. She has no issues or complaints today. She denies signs of infection. 10/5; patient presents for follow-up. She has no issues or complaints today. She denies signs of infection. She states that the right great toe has not been dressed by home health. 10/12; patient presents for follow-up. She has no issues or complaints today. She reports improvement in her wound healing. She has been using silver alginate to the right great toe wound. She denies signs of infection. 10/26; patient presents for follow-up. Home health did not have sorbact so they continued to use Hydrofera Blue under the wrap. She has been using silver alginate to the great toe wound however she did not have a dressing in place today. She currently denies signs of infection. 11/2; patient presents for follow-up. She has been using sorb act under the compression wrap. She reports using silver alginate to the toe wound again she does not have a dressing in place. She currently denies signs of infection. 11/23; patient presents for follow-up. Unfortunately she has missed her last 2 clinic appointments. She was last seen 3 weeks ago. She did her own compression wrap with Kerlix and Coban yesterday after seeing vein and vascular. She has not been dressing her right great toe wound. She currently denies signs of infection. 11/30; patient presents for 1 week follow-up. She states  she changed her dressing last week prior to home health and use sorb act with Dakin's and Hydrofera Blue. Home health has changed the dressing as well and they have been using sorbact. Today she reports increased redness to her right lower extremity. She has a history of cellulitis to this leg. She has been using silver alginate to the right great toe. Unfortunately she had an episode of diarrhea prior to coming in and had feces all over the right leg and to the wrap of her left leg. 12/7; patient presents for 1 week follow-up. She states that home health did not come out to change the dressing and she took it off yesterday. It is unclear if she is dressing the right toe wound. She denies signs of infection. 12/14; patient presents for 1 week follow-up. She has no issues or complaints today. 12/21; patient presents for follow-up. She has no issues or complaints today. She denies signs of infection. 12/28/2021; patient presents for follow-up. She was hospitalized for sepsis secondary to right lower extremity cellulitis On 12/23. She states she is currently at a SNF. She states that she was started on doxycycline this morning for her right great toe swelling and redness. She is not sure what dressings have been done to her left lower extremity for the past 3 weeks. She says its been mainly gauze with an Ace wrap. 1/25; patient presents for follow-up. She is still residing in a skilled nursing facility. She reports mild pain to the left lower extremity wound bed. She states she is going to see a podiatrist soon. 2/8; patient presents for follow-up. She has moved back to her residential community from her skilled nursing facility. She has no issues or complaints today. She denies signs of systemic infections. CLARENE, Laura Santiago ([id number]) 2/15; patient presents for follow-up. He has no issues or complaints today. She denies systemic signs of infection. 2/22; patient presents for follow-up. She has no  issues  or complaints today. She denies signs of infection. 3/1; patient presents for follow-up. She states that home health came out the day after she was seen in our clinic and yesterday to do the wrap change. She denies signs of infection. She reports excoriated skin on the ankle. 3/8; patient presents for follow-up. She has no issues or complaints today. She denies signs of infection. 3/15; patient presents for follow-up. Home health has been coming out to change the dressings. She reports more tenderness to the wound site. She denies purulent drainage, increased warmth or erythema to the area. 4/5; patient presents for follow-up. She has missed her last 2 clinic appointments. I have not seen her in 3 weeks. She was recently hospitalized for altered mental status. She was involuntarily committed. She was evaluated by psychiatry and deemed to have competency. There was no specific cause of her altered mental status. It was concluded that her physical and mental health were declining due to her chronic medical conditions. Currently home health has been coming out for dressing changes. Patient has also been doing her own dressing changes. She reports more skin breakdown to the periwound and now has a new wound. She denies fever/chills. She reports continued tenderness to the wound site. 4/12; patient with significant venous insufficiency and a large wound on her left lower leg taking up about 80% of the circumference of her lower leg. Cultures of this grew MRSA and Pseudomonas. She had completed a course of ciprofloxacin now is starting doxycycline. She has been using Dakin's wet-to-dry and a Tubigrip. She has home health twice a week and we change it once. 4/19; patient presents for follow-up. She completed her course of doxycycline. She has been using Dakin's wet-to-dry dressing and Tubigrip. Home health changes the dressing twice weekly. Currently she has no issues or complaints. 4/26; patient  presents for follow-up. At last clinic visit orders for home health were Iodosorb under compression therapy. Unfortunately they did not have the dressing and have been using Dakin's and gentamicin under the wrap. Patient currently denies signs of infection. She has no issues or complaints today. 5/3; patient presents for follow-up. Again Iodosorb has not been used under the compression therapy when home health comes out to change the wrap and dressing. They have been using Sorbact. It is unclear why this is happening since we send orders weekly to the agency. She denies signs of infection. Patient has not purchased the [place] antibiotics. We reached out to the company and they said they have been trying to contact her on a regular basis. We gave the patient the number to call to order the medication. 5/10; patient presents for follow-up. She has no issues or complaints today. Again home health has not been using Iodosorb. Mepilex was on the wound bed. No other dressings noted. She brought in her Keystone antibiotics. She denies signs of infection. 5/17; patient presents for follow-up. Home health has come out twice since she was last seen. Joint well she has been using Keystone antibiotic with Sorbact under the compression wrap. She has no issues or complaints today. She denies signs of infection. 5/24; patient presents for follow-up. We have been using Keystone antibiotics with Sorbact under compression therapy. She is tolerating the treatment well. She is reporting improvement in wound healing. She denies signs of infection. 5/31; patient presents for follow-up. We continue to do [facility] antibiotics with Sorbact under compression therapy. She continues to report improvement in wound healing. Home health comes out and changes the dressing  once weekly. 05-17-2022 upon evaluation today patient appears to be doing better in regard to her wound especially compared to the last time I saw  her. Fortunately I do think that she is seeing improvements. With that being said I do believe that she may be benefit from sharp debridement today to clear away some of the necrotic debris I discussed that with her as well. She is an amendable to that plan. Otherwise she is very pleased with how the [employer] is doing for her. 6/14; patient presents for follow-up. We have been using Keystone antibiotic with Sorbact and absorbent dressings under 3 layer compression. She has no issues or complaints today. She reports improvement in wound healing. She denies signs of infection. 6/21; patient presents for follow-up. We are continuing with [facility] antibiotic and Sorbact under 3 layer compression. Patient has no complaints. Continued wound healing is happening. She denies signs of infection. 6/28; patient presents for follow-up. We have been using Keystone antibiotic with Sorbact under 3 layer compression. Usually home health comes out and changes the dressing twice a week. Unfortunately they did not go out to change the dressing. It is unclear why. Patient did not call them. She currently denies signs of infection. 7/5; patient presents for follow-up. We have been using Keystone antibiotic with calcium alginate under 3 layer compression. She reports improvement in wound healing. She denies signs of infection. Home health has come out to do dressing changes twice this past week. 7/12; patient presents for follow-up. We have been using Keystone antibiotic with calcium alginate under 3 layer compression. Patient states that home health came out once last week to change the dressing. She reports improvement in wound healing. She currently denies signs of infection. 7/19; patient presents for follow-up. We have been using Keystone antibiotic with calcium alginate under 3 layer compression. Home health came out once last week to change the dressing. She has no issues or complaints today. She denies signs of  infection. 8/2; patient presents for follow-up. We have been using Keystone antibiotic with calcium alginate under 3 layer compression. Unfortunately she missed her appointment last week and home health did not come out to do dressing changes. Patient currently denies signs of infection. 8/9; patient presents for follow-up. We have been using Keystone with calcium alginate under 3 layer compression. She states that home health came out once last week. She currently denies signs of infection. Her wrap was completely wet. She states she was cleaning the top of the leg and water soaked down into the wrap. 8/16; patient presents for follow-up. We have been using Keystone with calcium alginate under 3 layer compression. She states that home health came out twice last week. She has no issues or complaints today. Laura Santiago, Laura Santiago ([id number]) 8/23; patient presents for follow-up. He has been using Keystone with calcium alginate under 3 layer compression. Home health came out twice last week. She denies signs of infection. 8/30; patient presents for follow-up. We have been using Keystone with calcium alginate under 3 layer compression. Home health came out once last week to change the dressing. Patient reports improvement in wound healing. She states she is almost done with her chemotherapy infusions and has 1 more left. 9/13; patient presents for follow-up. She has lost the capsules to her [facility] antibiotic which I believe is the vancomycin pills. She has her Zosyn powder today. We have been using Keystone antibiotic ointment with calcium alginate under 3 layer compression. She is concerned about systemic infection however her  vitals are stable and there is no surrounding soft tissue infection. She would like to remain a patient in our wound care center however would like a second opinion for her wound care at another facility. She asked to be referred to [facility] wound care center. 9/20; patient presents  for follow-up. She found her vancomycin capsules and brought in her complete Keystone antibiotic ointment set today. Unfortunately she has developed skin breakdown and Erythema to the right lower extremity With increased swelling. She states she went to a pow wow Over the weekend and was on her feet for extended periods of time. She saw her oncologist yesterday who prescribed her doxycycline for her right lower extremity erythema. 9/27; patient presents for follow-up. We have been using Keystone antibiotic with Aquacel under 3 layer compression to the lower extremities bilaterally. When home health came and changed the wrap she secretly put coffee into the spray mix along with [facility] antibiotic on her leg thinking the acidic component would better activate the zoysn (sonething she discussed with her microbiologist brother). She has reported improvement in wound healing. 10/4; patient presents for follow-up. She has no issues or complaints today. We have been doing Aquacel and keystone under 3 layer compression to the lower extremities bilaterally. This morning she took the right lower extremity wrap off as it was uncomfortable. She has no open wounds to this leg. Objective Constitutional Vitals Time Taken: 9:17 AM, Height: 66 in, Weight: 153 lbs, BMI: 24.7, Temperature: 98.5 F, Pulse: 76 bpm, Respiratory Rate: 18 breaths/min, Blood Pressure: 163/77 mmHg. General Notes: Left lower extremity: Open wound with granulation tissue and nonviable tissue. No signs of surrounding soft tissue infection. Right lower extremity: no open wounds, 2+ pitting edema. Integumentary (Hair, Skin) Wound #1 status is Open. Original cause of wound was Gradually Appeared. The date acquired was: 04/06/2021. The wound has been in treatment 62 weeks. The wound is located on the Left,Medial Lower Leg. The wound measures 6.5cm length x 10.7cm width x 0.2cm depth; 54.624cm^2 area and 10.925cm^3 volume. There is Fat Layer  (Subcutaneous Tissue) exposed. There is a medium amount of serosanguineous drainage noted. The wound margin is flat and intact. There is medium (34-66%) red, pink granulation within the wound bed. There is a medium (34-66%) amount of necrotic tissue within the wound bed including Adherent Slough. Wound #7 status is Healed - Epithelialized. Original cause of wound was Not Known. The date acquired was: 08/28/2022. The wound has been in treatment 2 weeks. The wound is located on the Right,Anterior Lower Leg. The wound measures 0cm length x 0cm width x 0cm depth; 0cm^2 area and 0cm^3 volume. There is a medium amount of serosanguineous drainage noted. The wound margin is flat and intact. There is no granulation within the wound bed. Assessment Active Problems ICD-10 Non-pressure chronic ulcer of other part of left lower leg with fat layer exposed Chronic venous hypertension (idiopathic) with ulcer of left lower extremity Chronic venous hypertension (idiopathic) with ulcer of right lower extremity Venous insufficiency (chronic) (peripheral) Long term (current) use of anticoagulants Essential (primary) hypertension Secondary malignant neoplasm of breast Laura Santiago, Laura Santiago. ([id number]) Patient's wounds have shown improvement in size and appearance since last clinic visit. There are no remaining wounds to the right lower extremity. I recommended using compression stockings daily. We will give her Tubigrip in office. I debrided nonviable tissue to the left lower extremity. No signs of infection. I recommended continuing the course with Aquacel and Keystone antibiotic under 3 layer compression. Follow- up  in 1 week. 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 There was a Excisional Skin/Subcutaneous Tissue Debridement with a total area of 16 sq cm performed by Kalman Shan, MD. With the following instrument(s): Curette to remove Viable and Non-Viable tissue/material. Material removed includes Subcutaneous Tissue and Slough and. A time out was conducted at 09:41, prior to the start of the procedure. A Minimum amount of bleeding was controlled with Pressure. The procedure was tolerated well. Post Debridement Measurements: 6.5cm length x 10.7cm width x 0.2cm depth; 10.925cm^3 volume. Character of Wound/Ulcer Post Debridement is stable. Severity of Tissue Post Debridement is: Fat layer exposed. 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 There was a Three Layer Compression Therapy Procedure with a pre-treatment ABI of 1.5 by Cornell Barman, RN. Post procedure Diagnosis Wound #1: Same as Pre-Procedure Plan 1. In office sharp debridement 2. Aquacel Keystone antibiotic left lower extremity 3. Tubigrip right lower extremity 4. Follow-up in 1 week Electronic Signature(s) Signed: 09/13/2022 2:44:12 PM By: Kalman Shan DO Entered By: Kalman Shan on 09/13/2022 10:03:04 Laura Santiago ([id number]) -------------------------------------------------------------------------------- SuperBill Details Patient Name: Laura Santiago. Date of Service: 09/13/2022 Medical Record Number: [id number] Patient Account Number: [id number] Date of Birth/Sex: 1946-06-06 (76 y.o. F) Treating RN: Cornell Barman Primary Care Provider: [facility], [place] Other Clinician: [name] Referring Provider: [facility] Treating Provider/Extender: Yaakov Guthrie in Treatment: 60 Diagnosis Coding ICD-10 Codes Code Description [phone] 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 I87.311 Chronic venous hypertension (idiopathic) with ulcer  of right lower extremity I87.2 Venous insufficiency (chronic) (peripheral) Z79.01 Long term (current) use of anticoagulants I10 Essential (primary) hypertension 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 Facility Procedures CPT4 Code: [id number] Description: 33007 - DEB SUBQ TISSUE 20 SQ CM/< Modifier: Quantity: 1 CPT4 Code: Description: ICD-10 Diagnosis Description L97.822 Non-pressure chronic ulcer of other part of left lower leg with fat layer I87.312 Chronic venous hypertension (idiopathic) with ulcer of left lower extremi Modifier: exposed ty Quantity: Physician Procedures CPT4 Code: [id number] Description: 11042 - WC PHYS SUBQ TISS 20 SQ CM Modifier: Quantity: 1 CPT4 Code: Description: ICD-10 Diagnosis Description L97.822 Non-pressure chronic ulcer of other part of left lower leg with fat layer I87.312 Chronic venous hypertension (idiopathic) with ulcer of left lower extremi Modifier: exposed ty Quantity: Electronic Signature(s) Signed: 09/13/2022 2:44:12 PM By: Kalman Shan DO Entered By: Kalman Shan on 09/13/2022 10:03:18

## 2022-09-18 NOTE — Progress Notes (Signed)
Laura, Santiago ([id number]) Visit Report for 09/13/2022 Arrival Information Details Patient Name: Laura Santiago, Laura Santiago. Date of Service: 09/13/2022 9:00 AM Medical Record Number: [id number] Patient Account Number: [id number] Date of Birth/Sex: 03/14/1946 (76 y.o. F) Treating RN: Cornell Barman Primary Care Jahlia Omura: [facility], [place] Other Clinician: [name] Referring Lam Mccubbins: [facility] Treating Lacosta Hargan/Extender: Yaakov Guthrie in Treatment: 69 Visit Information History Since Last Visit All ordered tests and consults were completed: No Patient Arrived: [name] Added or deleted any medications: No Arrival Time: 09:16 Any new allergies or adverse reactions: No Transfer Assistance: None Had a fall or experienced change in No Patient Requires Transmission-Based No activities of daily living that may affect Precautions: risk of falls: Patient Has Alerts: Yes Hospitalized since last visit: No Patient Alerts: PT HAS SERVICE Pain Present Now: No ANIMAL ABI 07/11/21 R) 1.16 L) 1.27 Electronic Signature(s) Signed: 09/18/2022 4:50:08 PM By: [name] Entered By: [name] on 09/13/2022 09:16:57 Laura Santiago ([id number]) -------------------------------------------------------------------------------- Clinic Level of Care Assessment Details Patient Name: Laura Santiago. Date of Service: 09/13/2022 9:00 AM Medical Record Number: [id number] Patient Account Number: [id number] Date of Birth/Sex: Mar 20, 1946 (76 y.o. F) Treating RN: Cornell Barman Primary Care Lashandra Arauz: [facility], [place] Other Clinician: [name] Referring Khalil Szczepanik: [facility] Treating Radley Teston/Extender: Yaakov Guthrie in Treatment: 74 Clinic Level of Care Assessment Items TOOL 1 Quantity Score []  - Use when EandM and Procedure is performed on INITIAL visit 0 ASSESSMENTS - Nursing Assessment / Reassessment []  - General Physical Exam (combine w/ comprehensive  assessment (listed just below) when performed on new 0 pt. evals) []  - 0 Comprehensive Assessment (HX, ROS, Risk Assessments, Wounds Hx, etc.) ASSESSMENTS - Wound and Skin Assessment / Reassessment []  - Dermatologic / Skin Assessment (not related to wound area) 0 ASSESSMENTS - Ostomy and/or Continence Assessment and Care []  - Incontinence Assessment and Management 0 []  - 0 Ostomy Care Assessment and Management (repouching, etc.) PROCESS - Coordination of Care []  - Simple Patient / Family Education for ongoing care 0 []  - 0 Complex (extensive) Patient / Family Education for ongoing care []  - 0 Staff obtains Programmer, systems, Records, Test Results / Process Orders []  - 0 Staff telephones HHA, Nursing Homes / Clarify orders / etc []  - 0 Routine Transfer to another Facility (non-emergent condition) []  - 0 Routine Hospital Admission (non-emergent condition) []  - 0 New Admissions / Biomedical engineer / Ordering NPWT, Apligraf, etc. []  - 0 Emergency Hospital Admission (emergent condition) PROCESS - Special Needs []  - Pediatric / Minor Patient Management 0 []  - 0 Isolation Patient Management []  - 0 Hearing / Language / Visual special needs []  - 0 Assessment of Community assistance (transportation, D/C planning, etc.) []  - 0 Additional assistance / Altered mentation []  - 0 Support Surface(s) Assessment (bed, cushion, seat, etc.) INTERVENTIONS - Miscellaneous []  - External ear exam 0 []  - 0 Patient Transfer (multiple staff / Civil Service fast streamer / Similar devices) []  - 0 Simple Staple / Suture removal (25 or less) []  - 0 Complex Staple / Suture removal (26 or more) []  - 0 Hypo/Hyperglycemic Management (do not check if billed separately) []  - 0 Ankle / Brachial Index (ABI) - do not check if billed separately Has the patient been seen at the hospital within the last three years: Yes Total Score: 0 Level Of Care: ____ Laura Santiago ([id number]) Electronic Signature(s) Signed:  09/18/2022 4:50:08 PM By: [name] Entered By: [name] on 09/13/2022 10:07:31 Laura Santiago ([id number]) --------------------------------------------------------------------------------  Compression Therapy Details Patient Name: Laura, Santiago. Date of Service: 09/13/2022 9:00 AM Medical Record Number: [id number] Patient Account Number: [id number] Date of Birth/Sex: 1946/07/04 (76 y.o. F) Treating RN: Cornell Barman Primary Care Alverta Caccamo: [facility], [place] Other Clinician: [name] Referring Izabell Schalk: [facility] Treating Jahquez Steffler/Extender: Yaakov Guthrie in Treatment: 88 Compression Therapy Performed for Wound Assessment: Wound #1 Left,Medial Lower Leg Performed By: Clinician Cornell Barman, RN Compression Type: Three Layer Pre Treatment ABI: 1.5 Post Procedure Diagnosis Same as Pre-procedure Electronic Signature(s) Signed: 09/18/2022 4:50:08 PM By: [name] Entered By: [name] on 09/13/2022 09:40:17 Laura Santiago ([id number]) -------------------------------------------------------------------------------- Encounter Discharge Information Details Patient Name: Laura Santiago. Date of Service: 09/13/2022 9:00 AM Medical Record Number: [id number] Patient Account Number: [id number] Date of Birth/Sex: 11-20-1946 (76 y.o. F) Treating RN: Cornell Barman Primary Care Abbie Berling: [facility], [place] Other Clinician: [name] Referring Yaneth Fairbairn: [facility] Treating Jolin Benavides/Extender: Yaakov Guthrie in Treatment: 32 Encounter Discharge Information Items Post Procedure Vitals Discharge Condition: Stable Temperature (F): 98.5 Ambulatory Status: Walker Pulse (bpm): 76 Discharge Destination: Home Respiratory Rate (breaths/min): 18 Transportation: Other Blood Pressure (mmHg): 163/77 Accompanied By: self Schedule Follow-up Appointment: Yes Clinical Summary of Care: Electronic Signature(s) Signed: 09/18/2022 4:50:08  PM By: [name] Entered By: [name] on 09/13/2022 10:11:09 Laura Santiago ([id number]) -------------------------------------------------------------------------------- Lower Extremity Assessment Details Patient Name: Laura Santiago. Date of Service: 09/13/2022 9:00 AM Medical Record Number: [id number] Patient Account Number: [id number] Date of Birth/Sex: July 05, 1946 (76 y.o. F) Treating RN: Cornell Barman Primary Care Soledad Budreau: [facility], [place] Other Clinician: [name] Referring Lalita Ebel: [facility] Treating Tysin Salada/Extender: Yaakov Guthrie in Treatment: 62 Edema Assessment Assessed: [Left: Yes] [Right: Yes] Edema: [Left: Yes] [Right: Yes] Calf Left: Right: Point of Measurement: 30 cm From Medial Instep 34.5 cm 35.6 cm Ankle Left: Right: Point of Measurement: 10 cm From Medial Instep 23.4 cm 22.7 cm Vascular Assessment Pulses: Dorsalis Pedis Palpable: [Left:Yes] [Right:Yes] Electronic Signature(s) Signed: 09/13/2022 9:40:21 AM By: Gretta Cool, BSN, RN, CWS, [name] RN, BSN Signed: 09/18/2022 4:50:08 PM By: [name] Entered By: [name] on 09/13/2022 [place], Lares. ([id number]) -------------------------------------------------------------------------------- Multi Wound Chart Details Patient Name: LITHA, LAMARTINA. Date of Service: 09/13/2022 9:00 AM Medical Record Number: [id number] Patient Account Number: [id number] Date of Birth/Sex: [birth year] (76 y.o. F) Treating RN: Cornell Barman Primary Care Sidonie Dexheimer: [facility], [place] Other Clinician: [name] Referring Racine Erby: [facility] Treating Jene Huq/Extender: Yaakov Guthrie in Treatment: 47 Vital Signs Height(in): 64 Pulse(bpm): 62 Weight(lbs): 153 Blood Pressure(mmHg): 163/77 Body Mass Index(BMI): 24.7 Temperature(F): 98.5 Respiratory Rate(breaths/min): 18 Photos: [N/A:N/A] Wound Location: Left, Medial Lower Leg Right, Anterior Lower Leg  N/A Wounding Event: Gradually Appeared Not Known N/A Primary Etiology: Venous Leg Ulcer Cellulitis N/A Comorbid History: Hypertension, Osteoarthritis, Hypertension, Osteoarthritis, N/A Received Chemotherapy, Received Received Chemotherapy, Received Radiation Radiation Date Acquired: 04/06/2021 08/28/2022 N/A Weeks of Treatment: 62 2 N/A Wound Status: Open Open N/A Wound Recurrence: No No N/A Clustered Wound: Yes No N/A Measurements L x W x D (cm) 6.5x10.7x0.2 0.1x0.1x0.1 N/A Area (cm) : 54.624 0.008 N/A Volume (cm) : 10.925 0.001 N/A % Reduction in Area: 39.50% 100.00% N/A % Reduction in Volume: 39.50% 100.00% N/A Classification: Full Thickness Without Exposed Partial Thickness N/A Support Structures Exudate Amount: Medium Medium N/A Exudate Type: Serosanguineous Serosanguineous N/A Exudate Color: red, brown red, brown N/A Wound Margin: Flat and Intact Flat and Intact N/A Granulation Amount: Medium (34-66%) None Present (0%) N/A Granulation Quality: Red, Pink  N/A N/A Necrotic Amount: Medium (34-66%) N/A N/A Exposed Structures: Fat Layer (Subcutaneous Tissue): N/A N/A Yes Fascia: No Tendon: No Muscle: No Joint: No Bone: No Epithelialization: Small (1-33%) None N/A Treatment Notes Electronic Signature(s) Signed: 09/18/2022 4:50:08 PM By: [name] Entered By: [name] on 09/13/2022 09:39:42 Laura Santiago ([id number]) Quentin Ore, Ralene Muskrat ([id number]) -------------------------------------------------------------------------------- [place] Details Patient Name: NADRA, HRITZ. Date of Service: 09/13/2022 9:00 AM Medical Record Number: [id number] Patient Account Number: [id number] Date of Birth/Sex: 1946-12-04 (76 y.o. F) Treating RN: Cornell Barman Primary Care Bani Gianfrancesco: [facility], [place] Other Clinician: [name] Referring Danyal Whitenack: [facility] Treating Tarek Cravens/Extender: Yaakov Guthrie in Treatment: 45 Active  Inactive Soft Tissue Infection Nursing Diagnoses: Impaired tissue integrity Potential for infection: soft tissue Goals: Patient's soft tissue infection will resolve Date Initiated: 03/15/2022 Target Resolution Date: 04/27/2022 Goal Status: Active Signs and symptoms of infection will be recognized early to allow for prompt treatment Date Initiated: 03/15/2022 Date Inactivated: 04/26/2022 Target Resolution Date: 04/27/2022 Goal Status: Met Interventions: Assess signs and symptoms of infection every visit Treatment Activities: Culture and sensitivity : 03/15/2022 Notes: Wound/Skin Impairment Nursing Diagnoses: Impaired tissue integrity Goals: Patient/caregiver will verbalize understanding of skin care regimen Date Initiated: 07/06/2021 Date Inactivated: 07/27/2021 Target Resolution Date: 07/06/2021 Goal Status: Met Ulcer/skin breakdown will have a volume reduction of 30% by week 4 Date Initiated: 07/06/2021 Date Inactivated: 10/12/2021 Target Resolution Date: 08/06/2021 Goal Status: Unmet Unmet Reason: cont tx Ulcer/skin breakdown will have a volume reduction of 50% by week 8 Date Initiated: 07/06/2021 Target Resolution Date: 09/06/2021 Goal Status: Active Ulcer/skin breakdown will have a volume reduction of 80% by week 12 Date Initiated: 07/06/2021 Target Resolution Date: 10/06/2021 Goal Status: Active Ulcer/skin breakdown will heal within 14 weeks Date Initiated: 07/06/2021 Target Resolution Date: 11/06/2021 Goal Status: Active Interventions: Assess patient/caregiver ability to obtain necessary supplies Assess patient/caregiver ability to perform ulcer/skin care regimen upon admission and as needed Assess ulceration(s) every visit Treatment Activities: Referred to DME Acheron Sugg for dressing supplies : 07/06/2021 Skin care regimen initiated : 07/06/2021 Notes: BERNESTINE, HOLSAPPLE ([id number]) Electronic Signature(s) Signed: 09/13/2022 9:40:21 AM By: Gretta Cool, BSN, RN, CWS, [name] RN,  BSN Signed: 09/18/2022 4:50:08 PM By: [name] Entered By: [name] on 09/13/2022 09:31:05 Laura Santiago ([id number]) -------------------------------------------------------------------------------- Pain Assessment Details Patient Name: MADEEHA, COSTANTINO. Date of Service: 09/13/2022 9:00 AM Medical Record Number: [id number] Patient Account Number: [id number] Date of Birth/Sex: December 03, 1946 (76 y.o. F) Treating RN: Cornell Barman Primary Care Strummer Canipe: [facility], [place] Other Clinician: [name] Referring Georgette Helmer: [facility] Treating Imir Brumbach/Extender: Yaakov Guthrie in Treatment: 63 Active Problems Location of Pain Severity and Description of Pain Patient Has Paino No Site Locations Pain Management and Medication Current Pain Management: Electronic Signature(s) Signed: 09/13/2022 9:40:21 AM By: Gretta Cool, BSN, RN, CWS, [name] RN, BSN Signed: 09/18/2022 4:50:08 PM By: [name] Entered By: [name] on 09/13/2022 09:20:42 Laura Santiago ([id number]) -------------------------------------------------------------------------------- Patient/Caregiver Education Details Patient Name: BRAELEY, BUSKEY. Date of Service: 09/13/2022 9:00 AM Medical Record Number: [id number] Patient Account Number: [id number] Date of Birth/Gender: Nov 20, 1946 (76 y.o. F) Treating RN: Cornell Barman Primary Care Physician: [facility], [place] Other Clinician: [name] Referring Physician: [facility] Treating Physician/Extender: Yaakov Guthrie in Treatment: 13 Education Assessment Education Provided To: Patient Education Topics Provided Wound/Skin Impairment: Handouts: Other: continue wound care as directed Methods: Explain/Verbal Responses: State content correctly Electronic Signature(s) Signed: 09/18/2022 4:50:08 PM By: [name] Entered By: [name] on 09/13/2022  10:08:36 DARLIN, STENSETH  ([id number]) -------------------------------------------------------------------------------- Wound Assessment Details Patient Name: LACY, SOFIA. Date of Service: 09/13/2022 9:00 AM Medical Record Number: [id number] Patient Account Number: [id number] Date of Birth/Sex: 30-May-1946 (76 y.o. F) Treating RN: Cornell Barman Primary Care Perrin Gens: [facility] [place], [place] Other Clinician: [name] Referring Keiandre Cygan: [facility] Treating Abundio Teuscher/Extender: Yaakov Guthrie in Treatment: 62 Wound Status Wound Number: 1 Primary Venous Leg Ulcer Etiology: Wound Location: Left, Medial Lower Leg Wound Status: Open Wounding Event: Gradually Appeared Comorbid Hypertension, Osteoarthritis, Received Chemotherapy, Date Acquired: 04/06/2021 History: Received Radiation Weeks Of Treatment: 62 Clustered Wound: Yes Photos Wound Measurements Length: (cm) 6.5 Width: (cm) 10.7 Depth: (cm) 0.2 Area: (cm) 54.624 Volume: (cm) 10.925 % Reduction in Area: 39.5% % Reduction in Volume: 39.5% Epithelialization: Small (1-33%) Wound Description Classification: Full Thickness Without Exposed Support Structures Wound Margin: Flat and Intact Exudate Amount: Medium Exudate Type: Serosanguineous Exudate Color: red, brown Foul Odor After Cleansing: No Slough/Fibrino Yes Wound Bed Granulation Amount: Medium (34-66%) Exposed Structure Granulation Quality: Red, Pink Fascia Exposed: No Necrotic Amount: Medium (34-66%) Fat Layer (Subcutaneous Tissue) Exposed: Yes Necrotic Quality: Adherent Slough Tendon Exposed: No Muscle Exposed: No Joint Exposed: No Bone Exposed: No Treatment Notes Wound #1 (Lower Leg) Wound Laterality: Left, Medial Cleanser Wound Cleanser Discharge Instruction: Wash your hands with soap and water. Remove old dressing, discard into plastic bag and place into trash. Cleanse the wound with Wound Cleanser prior to applying a clean dressing using gauze sponges, not tissues  or cotton balls. Do not scrub or use excessive force. Pat dry using gauze sponges, not tissue or cotton balls. KASIYA, BURCK ([id number]) Peri-Wound Care Nystatin Cream USP 30 (g) Discharge Instruction: Use Nystatin Cream as directed. MIx 1:1 with TCA cream and applied to peri wound Triamcinolone Acetonide Cream, 0.1%, 15 (g) tube Discharge Instruction: Apply as directed. mix 1:1 with nystatin cream for peri wound Topical Primary Dressing Aquacel Extra Hydrofiber Dressing, 4x5 (in/in) keystone compound Discharge Instruction: apply to wound bed as directed on medication label PLEASE follow direction on pill bottle for proper mixing Secondary Dressing Zetuvit Absorbent Pad, 4x8 (in/in) Secured With Compression Wrap 3-LAYER WRAP - Profore Lite LF 3 Multilayer Compression Bandaging System Discharge Instruction: Apply 3 multi-layer wrap as prescribed. Compression Stockings Add-Ons Electronic Signature(s) Signed: 09/13/2022 9:40:21 AM By: Gretta Cool, BSN, RN, CWS, [name] RN, BSN Signed: 09/18/2022 4:50:08 PM By: [name] Entered By: [name] on 09/13/2022 09:29:32 Laura Santiago ([id number]) -------------------------------------------------------------------------------- Wound Assessment Details Patient Name: JULYANA, WOOLVERTON. Date of Service: 09/13/2022 9:00 AM Medical Record Number: [id number] Patient Account Number: [id number] Date of Birth/Sex: 03/17/1946 (76 y.o. F) Treating RN: Cornell Barman Primary Care Guenther Dunshee: [facility], [place] Other Clinician: [name] Referring Areeb Corron: [facility] Treating Esparanza Krider/Extender: Yaakov Guthrie in Treatment: 62 Wound Status Wound Number: 7 Primary Cellulitis Etiology: Wound Location: Right, Anterior Lower Leg Wound Status: Healed - Epithelialized Wounding Event: Not Known Comorbid Hypertension, Osteoarthritis, Received Chemotherapy, Date Acquired: 08/28/2022 History: Received Radiation Weeks Of Treatment:  2 Clustered Wound: No Photos Wound Measurements Length: (cm) 0 % Red Width: (cm) 0 % Red Depth: (cm) 0 Epith Area: (cm) 0 Volume: (cm) 0 uction in Area: 100% uction in Volume: 100% elialization: None Wound Description Classification: Partial Thickness Foul Wound Margin: Flat and Intact Slou Exudate Amount: Medium Exudate Type: Serosanguineous Exudate Color: red, brown Odor After Cleansing: No gh/Fibrino No Wound Bed Granulation Amount: None Present (0%) Treatment Notes Wound #7 (Lower Leg) Wound Laterality: Right, Anterior Cleanser  Peri-Wound Care Topical Primary Dressing Secondary Dressing Secured With Compression Wrap CALINDA, STOCKINGER ([id number]) Compression Stockings Add-Ons Electronic Signature(s) Signed: 09/13/2022 2:41:22 PM By: Gretta Cool, BSN, RN, CWS, [name] RN, BSN Signed: 09/18/2022 4:50:08 PM By: [name] Previous Signature: 09/13/2022 9:40:21 AM Version By: Gretta Cool, BSN, RN, CWS, [name] RN, BSN Entered By: [name] on 09/13/2022 09:41:23 MICHELLA, DETJEN ([id number]) -------------------------------------------------------------------------------- Vitals Details Patient Name: EQUILLA, QUE. Date of Service: 09/13/2022 9:00 AM Medical Record Number: [id number] Patient Account Number: [id number] Date of Birth/Sex: Apr 17, 1946 (76 y.o. F) Treating RN: Cornell Barman Primary Care Darlisha Kelm: [facility], [place] Other Clinician: [name] Referring Trystin Terhune: [facility] Treating Thien Berka/Extender: Yaakov Guthrie in Treatment: 64 Vital Signs Time Taken: 09:17 Temperature (F): 98.5 Height (in): 66 Pulse (bpm): 76 Weight (lbs): 153 Respiratory Rate (breaths/min): 18 Body Mass Index (BMI): 24.7 Blood Pressure (mmHg): 163/77 Reference Range: 80 - 120 mg / dl Electronic Signature(s) Signed: 09/18/2022 4:50:08 PM By: [name] Entered By: [name] on 09/13/2022 09:20:37

## 2022-09-19 ENCOUNTER — Other Ambulatory Visit: Payer: Self-pay | Admitting: Oncology

## 2022-09-19 ENCOUNTER — Inpatient Hospital Stay: Payer: Medicare Other

## 2022-09-19 ENCOUNTER — Ambulatory Visit: Payer: Medicare Other | Admitting: Oncology

## 2022-09-19 ENCOUNTER — Inpatient Hospital Stay: Payer: Medicare Other | Attending: Oncology

## 2022-09-19 VITALS — BP 135/72 | HR 84 | Temp 98.4°F | Resp 16 | Wt 142.6 lb

## 2022-09-19 DIAGNOSIS — Z5111 Encounter for antineoplastic chemotherapy: Secondary | ICD-10-CM | POA: Diagnosis present

## 2022-09-19 DIAGNOSIS — C7951 Secondary malignant neoplasm of bone: Secondary | ICD-10-CM | POA: Insufficient documentation

## 2022-09-19 DIAGNOSIS — C50911 Malignant neoplasm of unspecified site of right female breast: Secondary | ICD-10-CM | POA: Insufficient documentation

## 2022-09-19 DIAGNOSIS — Z7901 Long term (current) use of anticoagulants: Secondary | ICD-10-CM | POA: Insufficient documentation

## 2022-09-19 DIAGNOSIS — Z86718 Personal history of other venous thrombosis and embolism: Secondary | ICD-10-CM | POA: Diagnosis not present

## 2022-09-19 DIAGNOSIS — N184 Chronic kidney disease, stage 4 (severe): Secondary | ICD-10-CM | POA: Insufficient documentation

## 2022-09-19 DIAGNOSIS — Z808 Family history of malignant neoplasm of other organs or systems: Secondary | ICD-10-CM | POA: Diagnosis not present

## 2022-09-19 DIAGNOSIS — C50919 Malignant neoplasm of unspecified site of unspecified female breast: Secondary | ICD-10-CM

## 2022-09-19 DIAGNOSIS — M1711 Unilateral primary osteoarthritis, right knee: Secondary | ICD-10-CM | POA: Diagnosis not present

## 2022-09-19 DIAGNOSIS — Z170 Estrogen receptor positive status [ER+]: Secondary | ICD-10-CM | POA: Diagnosis not present

## 2022-09-19 DIAGNOSIS — Z5112 Encounter for antineoplastic immunotherapy: Secondary | ICD-10-CM | POA: Insufficient documentation

## 2022-09-19 DIAGNOSIS — C779 Secondary and unspecified malignant neoplasm of lymph node, unspecified: Secondary | ICD-10-CM | POA: Insufficient documentation

## 2022-09-19 DIAGNOSIS — Z8042 Family history of malignant neoplasm of prostate: Secondary | ICD-10-CM | POA: Insufficient documentation

## 2022-09-19 DIAGNOSIS — C787 Secondary malignant neoplasm of liver and intrahepatic bile duct: Secondary | ICD-10-CM | POA: Diagnosis not present

## 2022-09-19 DIAGNOSIS — I129 Hypertensive chronic kidney disease with stage 1 through stage 4 chronic kidney disease, or unspecified chronic kidney disease: Secondary | ICD-10-CM | POA: Diagnosis not present

## 2022-09-19 DIAGNOSIS — Z800 Family history of malignant neoplasm of digestive organs: Secondary | ICD-10-CM | POA: Insufficient documentation

## 2022-09-19 DIAGNOSIS — D631 Anemia in chronic kidney disease: Secondary | ICD-10-CM | POA: Diagnosis not present

## 2022-09-19 DIAGNOSIS — L539 Erythematous condition, unspecified: Secondary | ICD-10-CM | POA: Diagnosis not present

## 2022-09-19 DIAGNOSIS — L989 Disorder of the skin and subcutaneous tissue, unspecified: Secondary | ICD-10-CM | POA: Diagnosis not present

## 2022-09-19 LAB — CBC WITH DIFFERENTIAL/PLATELET
Abs Immature Granulocytes: 0.02 10*3/uL (ref 0.00–0.07)
Basophils Absolute: 0 10*3/uL (ref 0.0–0.1)
Basophils Relative: 1 %
Eosinophils Absolute: 0.2 10*3/uL (ref 0.0–0.5)
Eosinophils Relative: 4 %
HCT: 33.1 % — ABNORMAL LOW (ref 36.0–46.0)
Hemoglobin: 10.2 g/dL — ABNORMAL LOW (ref 12.0–15.0)
Immature Granulocytes: 0 %
Lymphocytes Relative: 14 %
Lymphs Abs: 0.8 10*3/uL (ref 0.7–4.0)
MCH: 26.4 pg (ref 26.0–34.0)
MCHC: 30.8 g/dL (ref 30.0–36.0)
MCV: 85.5 fL (ref 80.0–100.0)
Monocytes Absolute: 0.3 10*3/uL (ref 0.1–1.0)
Monocytes Relative: 6 %
Neutro Abs: 3.9 10*3/uL (ref 1.7–7.7)
Neutrophils Relative %: 75 %
Platelets: 241 10*3/uL (ref 150–400)
RBC: 3.87 MIL/uL (ref 3.87–5.11)
RDW: 17.4 % — ABNORMAL HIGH (ref 11.5–15.5)
WBC: 5.2 10*3/uL (ref 4.0–10.5)
nRBC: 0 % (ref 0.0–0.2)

## 2022-09-19 LAB — COMPREHENSIVE METABOLIC PANEL
ALT: 12 U/L (ref 0–44)
AST: 20 U/L (ref 15–41)
Albumin: 3.7 g/dL (ref 3.5–5.0)
Alkaline Phosphatase: 70 U/L (ref 38–126)
Anion gap: 6 (ref 5–15)
BUN: 31 mg/dL — ABNORMAL HIGH (ref 8–23)
CO2: 26 mmol/L (ref 22–32)
Calcium: 9.1 mg/dL (ref 8.9–10.3)
Chloride: 107 mmol/L (ref 98–111)
Creatinine, Ser: 1.2 mg/dL — ABNORMAL HIGH (ref 0.44–1.00)
GFR, Estimated: 46 mL/min — ABNORMAL LOW (ref >60)
Glucose, Bld: 105 mg/dL — ABNORMAL HIGH (ref 70–99)
Potassium: 3.9 mmol/L (ref 3.5–5.1)
Sodium: 139 mmol/L (ref 135–145)
Total Bilirubin: 0.5 mg/dL (ref 0.3–1.2)
Total Protein: 7.5 g/dL (ref 6.5–8.1)

## 2022-09-19 MED ORDER — TRASTUZUMAB-DKST CHEMO 150 MG IV SOLR
420.0000 mg | Freq: Once | INTRAVENOUS | Status: AC
  Administered 2022-09-19: 420 mg via INTRAVENOUS
  Filled 2022-09-19: qty 20

## 2022-09-19 MED ORDER — SODIUM CHLORIDE 0.9 % IV SOLN
420.0000 mg | Freq: Once | INTRAVENOUS | Status: AC
  Administered 2022-09-19: 420 mg via INTRAVENOUS
  Filled 2022-09-19: qty 14

## 2022-09-19 MED ORDER — SODIUM CHLORIDE 0.9% FLUSH
10.0000 mL | INTRAVENOUS | Status: DC | PRN
  Administered 2022-09-19: 10 mL
  Filled 2022-09-19: qty 10

## 2022-09-19 MED ORDER — HEPARIN SOD (PORK) LOCK FLUSH 100 UNIT/ML IV SOLN
500.0000 [IU] | Freq: Once | INTRAVENOUS | Status: AC | PRN
  Administered 2022-09-19: 500 [IU]
  Filled 2022-09-19: qty 5

## 2022-09-19 MED ORDER — SODIUM CHLORIDE 0.9 % IV SOLN
Freq: Once | INTRAVENOUS | Status: AC
  Filled 2022-09-19: qty 250

## 2022-09-19 MED ORDER — ACETAMINOPHEN 325 MG PO TABS
650.0000 mg | ORAL_TABLET | Freq: Once | ORAL | Status: AC
  Administered 2022-09-19: 650 mg via ORAL
  Filled 2022-09-19: qty 2

## 2022-09-19 MED ORDER — ZOLEDRONIC ACID 4 MG/5ML IV CONC
3.0000 mg | INTRAVENOUS | Status: DC
  Administered 2022-09-19: 3 mg via INTRAVENOUS
  Filled 2022-09-19: qty 3.75

## 2022-09-19 MED ORDER — DIPHENHYDRAMINE HCL 25 MG PO CAPS
50.0000 mg | ORAL_CAPSULE | Freq: Once | ORAL | Status: AC
  Administered 2022-09-19: 50 mg via ORAL
  Filled 2022-09-19: qty 2

## 2022-09-19 MED ORDER — TRASTUZUMAB-DKST CHEMO 150 MG IV SOLR: 6.0000 mg/kg | Freq: Once | INTRAVENOUS | Status: DC

## 2022-09-19 MED ORDER — OXYCODONE HCL 10 MG PO TABS: 15.0000 mg | ORAL_TABLET | Freq: Four times a day (QID) | ORAL | 0 refills | Status: DC | PRN

## 2022-09-19 NOTE — Patient Instructions (Signed)
[facility] [place]  Discharge Instructions: Thank you for choosing [facility] to provide your oncology and hematology care.  If you have a lab appointment with the [facility], please go directly to the [facility] and check in at the registration area.  Wear comfortable clothing and clothing appropriate for easy access to any Portacath or PICC line.   We strive to give you quality time with your provider. You may need to reschedule your appointment if you arrive late (15 or more minutes).  Arriving late affects you and other patients whose appointments are after yours.  Also, if you miss three or more appointments without notifying the office, you may be dismissed from the clinic at the provider's discretion.      For prescription refill requests, have your pharmacy contact our office and allow 72 hours for refills to be completed.    Today you received the following chemotherapy and/or immunotherapy agents Ogivri and Perjeta       To help prevent nausea and vomiting after your treatment, we encourage you to take your nausea medication as directed.  BELOW ARE SYMPTOMS THAT SHOULD BE REPORTED IMMEDIATELY: *FEVER GREATER THAN 100.4 F (38 C) OR HIGHER *CHILLS OR SWEATING *NAUSEA AND VOMITING THAT IS NOT CONTROLLED WITH YOUR NAUSEA MEDICATION *UNUSUAL SHORTNESS OF BREATH *UNUSUAL BRUISING OR BLEEDING *URINARY PROBLEMS (pain or burning when urinating, or frequent urination) *BOWEL PROBLEMS (unusual diarrhea, constipation, pain near the anus) TENDERNESS IN MOUTH AND THROAT WITH OR WITHOUT PRESENCE OF ULCERS (sore throat, sores in mouth, or a toothache) UNUSUAL RASH, SWELLING OR PAIN  UNUSUAL VAGINAL DISCHARGE OR ITCHING   Items with * indicate a potential emergency and should be followed up as soon as possible or go to the Emergency Department if any problems should occur.  Please show the CHEMOTHERAPY ALERT CARD or IMMUNOTHERAPY ALERT CARD at  check-in to the Emergency Department and triage nurse.  Should you have questions after your visit or need to cancel or reschedule your appointment, please contact [facility] [place] [facility] [place]  [phone] and follow the prompts.  Office hours are 8:00 a.m. to 4:30 p.m. Monday - Friday. Please note that voicemails left after 4:00 p.m. may not be returned until the following business day.  We are closed weekends and major holidays. You have access to a nurse at all times for urgent questions. Please call the main number to the clinic [phone] and follow the prompts.  For any non-urgent questions, you may also contact your provider using MyChart. We now offer e-Visits for anyone 62 and older to request care online for non-urgent symptoms. For details visit [email or web address].   Also download the MyChart app! Go to the app store, search "MyChart", open the app, select [facility], and log in with your MyChart username and password.  Masks are optional in the cancer centers. If you would like for your care team to wear a mask while they are taking care of you, please let them know. For doctor visits, patients may have with them one support person who is at least 78 years old. At this time, visitors are not allowed in the infusion area.

## 2022-09-19 NOTE — Progress Notes (Signed)
ECHO performed 09/13/2022. EF 50-55%. Per Dr Janese Banks okay to proceed with tx today

## 2022-09-20 ENCOUNTER — Encounter (HOSPITAL_BASED_OUTPATIENT_CLINIC_OR_DEPARTMENT_OTHER): Payer: Medicare Other | Admitting: Internal Medicine

## 2022-09-20 DIAGNOSIS — L97822 Non-pressure chronic ulcer of other part of left lower leg with fat layer exposed: Secondary | ICD-10-CM

## 2022-09-20 DIAGNOSIS — I87313 Chronic venous hypertension (idiopathic) with ulcer of bilateral lower extremity: Secondary | ICD-10-CM | POA: Diagnosis not present

## 2022-09-26 ENCOUNTER — Other Ambulatory Visit: Payer: Self-pay | Admitting: Oncology

## 2022-09-26 MED ORDER — LORAZEPAM 0.5 MG PO TABS: 0.5000 mg | ORAL_TABLET | Freq: Every day | ORAL | 0 refills | Status: DC | PRN

## 2022-09-27 ENCOUNTER — Encounter (HOSPITAL_BASED_OUTPATIENT_CLINIC_OR_DEPARTMENT_OTHER): Payer: Medicare Other | Admitting: Internal Medicine

## 2022-09-27 DIAGNOSIS — I87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: Secondary | ICD-10-CM

## 2022-09-27 DIAGNOSIS — L97822 Non-pressure chronic ulcer of other part of left lower leg with fat layer exposed: Secondary | ICD-10-CM | POA: Diagnosis not present

## 2022-09-27 DIAGNOSIS — I87313 Chronic venous hypertension (idiopathic) with ulcer of bilateral lower extremity: Secondary | ICD-10-CM | POA: Diagnosis not present

## 2022-09-28 NOTE — Progress Notes (Signed)
JULIANA, BOLING ([id number]) [id number] Page 1 of 12 Visit Report for 09/27/2022 Chief Complaint Document Details Patient Name: Date of Service: Laura Santiago. 09/27/2022 10:30 A M Medical Record Number: [id number] Patient Account Number: [id number] Date of Birth/Sex: Treating RN: [birth year] (76 y.o. Laura Santiago Primary Care Provider: [place] Other Clinician: [name] Referring Provider: Treating Provider/Extender: [name] Weeks in Treatment: 62 Information Obtained from: Patient Chief Complaint Left lower extremity wound Right toe wounds Left upper lateral thigh wounds Electronic Signature(s) Signed: 09/27/2022 11:58:41 AM By: Kalman Shan DO Entered By: Kalman Shan on 09/27/2022 11:14:03 -------------------------------------------------------------------------------- Debridement Details Patient Name: Date of Service: Laura Jewett [name] 09/27/2022 10:30 A M Medical Record Number: [id number] Patient Account Number: [id number] Date of Birth/Sex: Treating RN: 1946-02-04 (76 y.o. Laura Santiago Primary Care Provider: [place] Other Clinician: [name] Referring Provider: Treating Provider/Extender: [name] Weeks in Treatment: 64 Debridement Performed for Assessment: Wound #1 Left,Medial Lower Leg Performed By: Physician Kalman Shan, MD Debridement Type: Debridement Severity of Tissue Pre Debridement: Fat layer exposed Level of Consciousness (Pre-procedure): Awake and Alert Pre-procedure Verification/Time Out Yes - 11:07 Taken: Start Time: 11:07 T Area Debrided (L x W): otal 6.5 (cm) x 10.3 (cm) = 66.95 (cm) Tissue and other material debrided: Viable, Non-Viable, Slough, Subcutaneous, Slough Level: Skin/Subcutaneous Tissue Debridement Description: Excisional Instrument: Curette Bleeding: Minimum Hemostasis Achieved:  Pressure End Time: 11:10 Response to Treatment: Procedure was tolerated well ANAB, VIVAR ([id number]) [id number] Page 2 of 12 Level of Consciousness (Santiago- Awake and Alert procedure): Santiago Debridement Measurements of Total Wound Length: (cm) 6.5 Width: (cm) 10.3 Depth: (cm) 0.2 Volume: (cm) 10.516 Character of Wound/Ulcer Santiago Debridement: Stable Severity of Tissue Santiago Debridement: Fat layer exposed Santiago Procedure Diagnosis Same as Pre-procedure Electronic Signature(s) Signed: 09/27/2022 11:58:41 AM By: Kalman Shan DO Signed: 09/27/2022 3:41:25 PM By: Gretta Cool, BSN, RN, CWS, [name] RN, BSN Signed: 09/28/2022 11:23:10 AM By: [name] Entered By: [name] on 09/27/2022 11:10:10 -------------------------------------------------------------------------------- HPI Details Patient Name: Date of Service: Laura Jewett [name] 09/27/2022 10:30 A M Medical Record Number: [id number] Patient Account Number: [id number] Date of Birth/Sex: Treating RN: 1946-04-01 (76 y.o. Laura Santiago Primary Care Provider: [place] Other Clinician: [name] Referring Provider: Treating Provider/Extender: Kalman Shan [facility] Weeks in Treatment: 22 History of Present Illness HPI Description: Admission 7/27 Ms. Johari Bennetts is a 76 year old female with a past medical history of ADHD, metastatic breast cancer, stage IV chronic kidney disease, history of DVT on Xarelto and chronic venous insufficiency that presents to the clinic for a chronic left lower extremity wound. She recently moved to [facility] 4 days ago. She was being followed by wound care center in [name]. She reports a 10-year history of wounds to her left lower extremity that eventually do heal with debridement and compression therapy. She states that the current wound reopened 4 months ago and she is using Vaseline and Coban. She denies signs of  infection. 8/3; patient presents for 1 week follow-up. She reports no issues or complaints today. She states she had vascular studies done in the last week. She denies signs of infection. She brought her little service dog with her today. 8/17; patient presents for follow-up. She has missed her last clinic appointment. She states she took the wrap off and attempted to rewrap her leg. She is having difficulty with transportation.  She has her service dog with her today. Overall she feels well and reports improvement in wound healing. She denies signs of infection. She reports owning an old Velcro wrap compression and has this at her living facility 9/14; patient presents for follow-up. Patient states that over the past 2 to 3 weeks she developed toe wounds to her right foot. She attributes this to tight fitting shoes. She subsequently developed cellulitis in the right leg and has been treated by doxycycline by her oncologist. She reports improvement in symptoms however continues to have some redness and swelling to this leg. T the left lower extremity patient has been having her wraps changed with home health twice weekly. She states that the [facility] is not helping control o the drainage. Other than that she has no issues or complaints today. She denies signs of infection to the left lower extremity. 9/21; patient presents for follow-up. She reports seeing infectious disease for her cellulitis. She reports no further management. She has home health that changes the wraps twice weekly. She has no issues or complaints today. She denies signs of infection. 10/5; patient presents for follow-up. She has no issues or complaints today. She denies signs of infection. She states that the right great toe has not been dressed by home health. 10/12; patient presents for follow-up. She has no issues or complaints today. She reports improvement in her wound healing. She has been using silver alginate to the  right great toe wound. She denies signs of infection. 10/26; patient presents for follow-up. Home health did not have sorbact so they continued to use Hydrofera Blue under the wrap. She has been using silver alginate to the great toe wound however she did not have a dressing in place today. She currently denies signs of infection. 11/2; patient presents for follow-up. She has been using sorb act under the compression wrap. She reports using silver alginate to the toe wound again she does not have a dressing in place. She currently denies signs of infection. Laura Santiago, Laura Santiago ([id number]) [id number] Page 3 of 12 11/23; patient presents for follow-up. Unfortunately she has missed her last 2 clinic appointments. She was last seen 3 weeks ago. She did her own compression wrap with Kerlix and Coban yesterday after seeing vein and vascular. She has not been dressing her right great toe wound. She currently denies signs of infection. 11/30; patient presents for 1 week follow-up. She states she changed her dressing last week prior to home health and use sorb act with Dakin's and Hydrofera Blue. Home health has changed the dressing as well and they have been using sorbact. T oday she reports increased redness to her right lower extremity. She has a history of cellulitis to this leg. She has been using silver alginate to the right great toe. Unfortunately she had an episode of diarrhea prior to coming in and had feces all over the right leg and to the wrap of her left leg. 12/7; patient presents for 1 week follow-up. She states that home health did not come out to change the dressing and she took it off yesterday. It is unclear if she is dressing the right toe wound. She denies signs of infection. 12/14; patient presents for 1 week follow-up. She has no issues or complaints today. 12/21; patient presents for follow-up. She has no issues or complaints today. She denies signs of  infection. 12/28/2021; patient presents for follow-up. She was hospitalized for sepsis secondary to right lower extremity cellulitis On 12/23.  She states she is currently at a SNF. She states that she was started on doxycycline this morning for her right great toe swelling and redness. She is not sure what dressings have been done to her left lower extremity for the past 3 weeks. She says its been mainly gauze with an Santiago wrap. 1/25; patient presents for follow-up. She is still residing in a skilled nursing facility. She reports mild pain to the left lower extremity wound bed. She states she is going to see a podiatrist soon. 2/8; patient presents for follow-up. She has moved back to her residential community from her skilled nursing facility. She has no issues or complaints today. She denies signs of systemic infections. 2/15; patient presents for follow-up. He has no issues or complaints today. She denies systemic signs of infection. 2/22; patient presents for follow-up. She has no issues or complaints today. She denies signs of infection. 3/1; patient presents for follow-up. She states that home health came out the day after she was seen in our clinic and yesterday to do the wrap change. She denies signs of infection. She reports excoriated skin on the ankle. 3/8; patient presents for follow-up. She has no issues or complaints today. She denies signs of infection. 3/15; patient presents for follow-up. Home health has been coming out to change the dressings. She reports more tenderness to the wound site. She denies purulent drainage, increased warmth or erythema to the area. 4/5; patient presents for follow-up. She has missed her last 2 clinic appointments. I have not seen her in 3 weeks. She was recently hospitalized for altered mental status. She was involuntarily committed. She was evaluated by psychiatry and deemed to have competency. There was no specific cause of her altered mental status. It  was concluded that her physical and mental health were declining due to her chronic medical conditions. Currently home health has been coming out for dressing changes. Patient has also been doing her own dressing changes. She reports more skin breakdown to the periwound and now has a new wound. She denies fever/chills. She reports continued tenderness to the wound site. 4/12; patient with significant venous insufficiency and a large wound on her left lower leg taking up about 80% of the circumference of her lower leg. Cultures of this grew MRSA and Pseudomonas. She had completed a course of ciprofloxacin now is starting doxycycline. She has been using Dakin's wet-to-dry and a Tubigrip. She has home health twice a week and we change it once. 4/19; patient presents for follow-up. She completed her course of doxycycline. She has been using Dakin's wet-to-dry dressing and Tubigrip. Home health changes the dressing twice weekly. Currently she has no issues or complaints. 4/26; patient presents for follow-up. At last clinic visit orders for home health were Iodosorb under compression therapy. Unfortunately they did not have the dressing and have been using Dakin's and gentamicin under the wrap. Patient currently denies signs of infection. She has no issues or complaints today. 5/3; patient presents for follow-up. Again Iodosorb has not been used under the compression therapy when home health comes out to change the wrap and dressing. They have been using Sorbact. It is unclear why this is happening since we send orders weekly to the agency. She denies signs of infection. Patient has not purchased the [facility] antibiotics. We reached out to the company and they said they have been trying to contact her on a regular basis. We gave the patient the number to call to order the medication. 5/10;  patient presents for follow-up. She has no issues or complaints today. Again home health has not been using Iodosorb.  Mepilex was on the wound bed. No other dressings noted. She brought in her Keystone antibiotics. She denies signs of infection. 5/17; patient presents for follow-up. Home health has come out twice since she was last seen. Joint well she has been using Keystone antibiotic with Sorbact under the compression wrap. She has no issues or complaints today. She denies signs of infection. 5/24; patient presents for follow-up. We have been using Keystone antibiotics with Sorbact under compression therapy. She is tolerating the treatment well. She is reporting improvement in wound healing. She denies signs of infection. 5/31; patient presents for follow-up. We continue to do [facility] antibiotics with Sorbact under compression therapy. She continues to report improvement in wound healing. Home health comes out and changes the dressing once weekly. 05-17-2022 upon evaluation today patient appears to be doing better in regard to her wound especially compared to the last time I saw her. Fortunately I do think that she is seeing improvements. With that being said I do believe that she may be benefit from sharp debridement today to clear away some of the necrotic debris I discussed that with her as well. She is an amendable to that plan. Otherwise she is very pleased with how the [employer] is doing for her. 6/14; patient presents for follow-up. We have been using Keystone antibiotic with Sorbact and absorbent dressings under 3 layer compression. She has no issues or complaints today. She reports improvement in wound healing. She denies signs of infection. 6/21; patient presents for follow-up. We are continuing with [facility] antibiotic and Sorbact under 3 layer compression. Patient has no complaints. Continued wound healing is happening. She denies signs of infection. 6/28; patient presents for follow-up. We have been using Keystone antibiotic with Sorbact under 3 layer compression. Usually home health comes out  and changes the dressing twice a week. Unfortunately they did not go out to change the dressing. It is unclear why. Patient did not call them. She currently denies signs of infection. 7/5; patient presents for follow-up. We have been using Keystone antibiotic with calcium alginate under 3 layer compression. She reports improvement in wound healing. She denies signs of infection. Home health has come out to do dressing changes twice this past week. 7/12; patient presents for follow-up. We have been using Keystone antibiotic with calcium alginate under 3 layer compression. Patient states that home health came out once last week to change the dressing. She reports improvement in wound healing. She currently denies signs of infection. 7/19; patient presents for follow-up. We have been using Keystone antibiotic with calcium alginate under 3 layer compression. Home health came out once last LAKENDRA, HELLING ([id number]) [id number] Page 4 of 12 week to change the dressing. She has no issues or complaints today. She denies signs of infection. 8/2; patient presents for follow-up. We have been using Keystone antibiotic with calcium alginate under 3 layer compression. Unfortunately she missed her appointment last week and home health did not come out to do dressing changes. Patient currently denies signs of infection. 8/9; patient presents for follow-up. We have been using Keystone with calcium alginate under 3 layer compression. She states that home health came out once last week. She currently denies signs of infection. Her wrap was completely wet. She states she was cleaning the top of the leg and water soaked down into the wrap. 8/16; patient presents for follow-up. We have been  using Keystone with calcium alginate under 3 layer compression. She states that home health came out twice last week. She has no issues or complaints today. 8/23; patient presents for follow-up. He has  been using Keystone with calcium alginate under 3 layer compression. Home health came out twice last week. She denies signs of infection. 8/30; patient presents for follow-up. We have been using Keystone with calcium alginate under 3 layer compression. Home health came out once last week to change the dressing. Patient reports improvement in wound healing. She states she is almost done with her chemotherapy infusions and has 1 more left. 9/13; patient presents for follow-up. She has lost the capsules to her [facility] antibiotic which I believe is the vancomycin pills. She has her Zosyn powder today. We have been using Keystone antibiotic ointment with calcium alginate under 3 layer compression. She is concerned about systemic infection however her vitals are stable and there is no surrounding soft tissue infection. She would like to remain a patient in our wound care center however would like a second opinion for her wound care at another facility. She asked to be referred to [facility] wound care center. 9/20; patient presents for follow-up. She found her vancomycin capsules and brought in her complete Keystone antibiotic ointment set today. Unfortunately she has developed skin breakdown and Erythema to the right lower extremity With increased swelling. She states she went to a pow wow Over the weekend and was on her feet for extended periods of time. She saw her oncologist yesterday who prescribed her doxycycline for her right lower extremity erythema. 9/27; patient presents for follow-up. We have been using Keystone antibiotic with Aquacel under 3 layer compression to the lower extremities bilaterally. When home health came and changed the wrap she secretly put coffee into the spray mix along with [facility] antibiotic on her leg thinking the acidic component would better activate the zoysn (sonething she discussed with her microbiologist brother). She has reported improvement in wound healing. 10/4; patient  presents for follow-up. She has no issues or complaints today. We have been doing Aquacel and keystone under 3 layer compression to the lower extremities bilaterally. This morning she took the right lower extremity wrap off as it was uncomfortable. She has no open wounds to this leg. 10/11; patient presents for follow-up. We have been doing Aquacel with Keystone antibiotic ointment under 3 layer compression to the left lower extremity. She developed a small blister to the anterior aspect of the left leg noticed when the wrap was taken off on intake. She currently denies signs of infection. 10/18; patient presents for follow-up. We have been doing Aquacel with Keystone antibiotic ointment under 3 layer compression to the left lower extremity. There has been continued improvement in wound healing. She denies signs of infection. Electronic Signature(s) Signed: 09/27/2022 11:58:41 AM By: Kalman Shan DO Entered By: Kalman Shan on 09/27/2022 11:14:47 -------------------------------------------------------------------------------- Physical Exam Details Patient Name: Date of Service: Laura Jewett [name] 09/27/2022 10:30 A M Medical Record Number: [id number] Patient Account Number: [id number] Date of Birth/Sex: Treating RN: 1946-10-18 (76 y.o. Laura Santiago Primary Care Provider: [place] Other Clinician: [name] Referring Provider: Treating Provider/Extender: Kalman Shan [facility] Weeks in Treatment: 2 Constitutional . Cardiovascular . Psychiatric . Notes Left lower extremity: Open wound with granulation tissue and nonviable tissue to the distal aspect that used to be circumferentially but now divided by islands of epithelization. Electronic Signature(s) TYHESHA, DUTSON [place] ([id number]) [id number] Page 5  of 12 Signed: 09/27/2022 11:58:41 AM By: Kalman Shan DO Entered By: Kalman Shan on 09/27/2022  11:16:09 -------------------------------------------------------------------------------- Physician Orders Details Patient Name: Date of Service: Laura Jewett [name] 09/27/2022 10:30 A M Medical Record Number: [id number] Patient Account Number: [id number] Date of Birth/Sex: Treating RN: 1946-03-04 (76 y.o. Laura Santiago Primary Care Provider: [place] Other Clinician: [name] Referring Provider: Treating Provider/Extender: [name] Weeks in Treatment: 60 Verbal / Phone Orders: No Diagnosis Coding Follow-up Appointments Return Appointment in 1 week. Nurse Visit as needed [place]: - [place] for wound care. May utilize formulary equivalent dressing for wound treatment orders unless otherwise specified. Home Health Nurse may visit PRN to address patients wound care needs. **Please direct any NON-WOUND related issues/requests for orders to patient's Primary Care Physician. **If current dressing causes regression in wound condition, may D/C ordered dressing product/s and apply Normal Saline Moist Dressing daily until next [place] or Other MD appointment. **Notify Wound Healing Center of regression in wound condition at [phone]. Other Home Health Orders/Instructions: - Dressing change 3 x weekly, twice by home health and once at wound clinic weekly. PLEASE make sure frequency between dressing changes is appropriate. Bathing/ Shower/ Hygiene May shower with wound dressing protected with water repellent cover or cast protector. No tub bath. Anesthetic (Use 'Patient Medications' Section for Anesthetic Order Entry) Lidocaine applied to wound bed Edema Control - Lymphedema / Segmental Compressive Device / Other Optional: One layer of unna paste to top of compression wrap (to act as an anchor). - PLEASE when applying wrap start from toes and go up to just below knee 3 Layer  Compression System for Lymphedema. - left lower leg Tubigrip single layer applied. - Tubi D single right leg Elevate, Exercise Daily and A void Standing for Long Periods of Time. Elevate legs to the level of the heart and pump ankles as often as possible Elevate leg(s) parallel to the floor when sitting. DO YOUR BEST to sleep in the bed at night. DO NOT sleep in your recliner. Long hours of sitting in a recliner leads to swelling of the legs and/or potential wounds on your backside. Additional Orders / Instructions Follow Nutritious Diet and Increase Protein Intake Medications-Please add to medication list. Keystone Compound Wound Treatment Wound #1 - Lower Leg Wound Laterality: Left, Medial Cleanser: Wound Cleanser (Home Health) 3 x Per Week/30 Days Discharge Instructions: Wash your hands with soap and water. Remove old dressing, discard into plastic bag and place into trash. Cleanse the wound with Wound Cleanser prior to applying a clean dressing using gauze sponges, not tissues or cotton balls. Do not scrub or use excessive force. Pat dry using gauze sponges, not tissue or cotton balls. Peri-Wound Care: Nystatin Cream USP 30 (g) 3 x Per Week/30 Days Discharge Instructions: Use Nystatin Cream as directed. MIx 1:1 with TCA cream and applied to peri wound Peri-Wound Care: Triamcinolone Acetonide Cream, 0.1%, 15 (g) tube 3 x Per Week/30 Days Discharge Instructions: Apply as directed. mix 1:1 with nystatin cream for peri wound Laura, Santiago ([id number]) [id number] Page 6 of 12 Prim Dressing: Aquacel Extra Hydrofiber Dressing, 4x5 (in/in) ary 3 x Per Week/30 Days Prim Dressing: keystone compound 3 x Per Week/30 Days ary Discharge Instructions: apply to wound bed as directed on medication label PLEASE follow direction on pill bottle for proper mixing Secondary Dressing: Zetuvit Absorbent Pad, 4x8 (in/in) 3 x Per Week/30 Days Compression  Wrap: 3-LAYER WRAP -  Profore Lite LF 3 Multilayer Compression Bandaging System 3 x Per Week/30 Days Discharge Instructions: Apply 3 multi-layer wrap as prescribed. Electronic Signature(s) Signed: 09/27/2022 11:58:41 AM By: Kalman Shan DO Entered By: Kalman Shan on 09/27/2022 11:18:03 -------------------------------------------------------------------------------- Problem List Details Patient Name: Date of Service: Laura Jewett [name] 09/27/2022 10:30 A M Medical Record Number: [id number] Patient Account Number: [id number] Date of Birth/Sex: Treating RN: [birth year] (76 y.o. Charolette Forward, Maudie [place] Primary Care Provider: [place] Other Clinician: [name] Referring Provider: Treating Provider/Extender: Kalman Shan [facility] Weeks in Treatment: 34 Active Problems ICD-10 Encounter Code Description Active Date MDM Diagnosis (819) [phone] Non-pressure chronic ulcer of other part of left lower leg with fat layer exposed11/23/2022 No Yes 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 11/02/2021 No Yes I87.311 Chronic venous hypertension (idiopathic) with ulcer of right lower extremity 08/30/2022 No Yes I87.2 Venous insufficiency (chronic) (peripheral) 07/06/2021 No Yes Z79.01 Long term (current) use of anticoagulants 07/06/2021 No Yes I10 Essential (primary) hypertension 07/06/2021 No Yes 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 07/06/2021 No Yes Inactive Problems Laura, Santiago ([id number]) [id number] Page 7 of 12 ICD-10 Code Description Active Date Inactive Date S81.802A Unspecified open wound, left lower leg, initial encounter 07/06/2021 07/06/2021 S91.101A Unspecified open wound of right great toe without damage to nail, initial encounter 08/24/2021 08/24/2021 S91.104A Unspecified open wound of right lesser toe(s) without damage to nail, initial encounter 08/24/2021 08/24/2021 Resolved Problems ICD-10 Code  Description Active Date Resolved Date S91.104D Unspecified open wound of right lesser toe(s) without damage to nail, subsequent 08/31/2021 08/31/2021 encounter S91.201D Unspecified open wound of right great toe with damage to nail, subsequent encounter 08/31/2021 08/31/2021 Electronic Signature(s) Signed: 09/27/2022 11:58:41 AM By: Kalman Shan DO Entered By: Kalman Shan on 09/27/2022 11:13:58 -------------------------------------------------------------------------------- Progress Note Details Patient Name: Date of Service: Laura Jewett [name] 09/27/2022 10:30 A M Medical Record Number: [id number] Patient Account Number: [id number] Date of Birth/Sex: Treating RN: January 19, 1946 (75 y.o. Laura Santiago Primary Care Provider: [place] Other Clinician: [name] Referring Provider: Treating Provider/Extender: Kalman Shan [facility] Weeks in Treatment: 56 Subjective Chief Complaint Information obtained from Patient Left lower extremity wound Right toe wounds Left upper lateral thigh wounds History of Present Illness (HPI) Admission 7/27 Ms. Lowell Mcgurk is a 76 year old female with a past medical history of ADHD, metastatic breast cancer, stage IV chronic kidney disease, history of DVT on Xarelto and chronic venous insufficiency that presents to the clinic for a chronic left lower extremity wound. She recently moved to [facility] 4 days ago. She was being followed by wound care center in [name]. She reports a 10-year history of wounds to her left lower extremity that eventually do heal with debridement and compression therapy. She states that the current wound reopened 4 months ago and she is using Vaseline and Coban. She denies signs of infection. 8/3; patient presents for 1 week follow-up. She reports no issues or complaints today. She states she had vascular studies done in the last week. She denies signs of infection. She brought her  little service dog with her today. 8/17; patient presents for follow-up. She has missed her last clinic appointment. She states she took the wrap off and attempted to rewrap her leg. She is having difficulty with transportation. She has her service dog with her today. Overall she feels well and reports improvement in wound healing. She denies signs  of infection. She reports owning an old Velcro wrap compression and has this at her living facility 9/14; patient presents for follow-up. Patient states that over the past 2 to 3 weeks she developed toe wounds to her right foot. She attributes this to tight fitting shoes. She subsequently developed cellulitis in the right leg and has been treated by doxycycline by her oncologist. She reports improvement in symptoms however continues to have some redness and swelling to this leg. T the left lower extremity patient has been having her wraps changed with home health twice weekly. She states that the [facility] is not helping control o the drainage. Other than that she has no issues or complaints today. She denies signs of infection to the left lower extremity. Laura, Santiago ([id number]) [id number] Page 8 of 12 9/21; patient presents for follow-up. She reports seeing infectious disease for her cellulitis. She reports no further management. She has home health that changes the wraps twice weekly. She has no issues or complaints today. She denies signs of infection. 10/5; patient presents for follow-up. She has no issues or complaints today. She denies signs of infection. She states that the right great toe has not been dressed by home health. 10/12; patient presents for follow-up. She has no issues or complaints today. She reports improvement in her wound healing. She has been using silver alginate to the right great toe wound. She denies signs of infection. 10/26; patient presents for follow-up. Home health did not have  sorbact so they continued to use Hydrofera Blue under the wrap. She has been using silver alginate to the great toe wound however she did not have a dressing in place today. She currently denies signs of infection. 11/2; patient presents for follow-up. She has been using sorb act under the compression wrap. She reports using silver alginate to the toe wound again she does not have a dressing in place. She currently denies signs of infection. 11/23; patient presents for follow-up. Unfortunately she has missed her last 2 clinic appointments. She was last seen 3 weeks ago. She did her own compression wrap with Kerlix and Coban yesterday after seeing vein and vascular. She has not been dressing her right great toe wound. She currently denies signs of infection. 11/30; patient presents for 1 week follow-up. She states she changed her dressing last week prior to home health and use sorb act with Dakin's and Hydrofera Blue. Home health has changed the dressing as well and they have been using sorbact. T oday she reports increased redness to her right lower extremity. She has a history of cellulitis to this leg. She has been using silver alginate to the right great toe. Unfortunately she had an episode of diarrhea prior to coming in and had feces all over the right leg and to the wrap of her left leg. 12/7; patient presents for 1 week follow-up. She states that home health did not come out to change the dressing and she took it off yesterday. It is unclear if she is dressing the right toe wound. She denies signs of infection. 12/14; patient presents for 1 week follow-up. She has no issues or complaints today. 12/21; patient presents for follow-up. She has no issues or complaints today. She denies signs of infection. 12/28/2021; patient presents for follow-up. She was hospitalized for sepsis secondary to right lower extremity cellulitis On 12/23. She states she is currently at a SNF. She states that she was  started on doxycycline this morning for her right  great toe swelling and redness. She is not sure what dressings have been done to her left lower extremity for the past 3 weeks. She says its been mainly gauze with an Santiago wrap. 1/25; patient presents for follow-up. She is still residing in a skilled nursing facility. She reports mild pain to the left lower extremity wound bed. She states she is going to see a podiatrist soon. 2/8; patient presents for follow-up. She has moved back to her residential community from her skilled nursing facility. She has no issues or complaints today. She denies signs of systemic infections. 2/15; patient presents for follow-up. He has no issues or complaints today. She denies systemic signs of infection. 2/22; patient presents for follow-up. She has no issues or complaints today. She denies signs of infection. 3/1; patient presents for follow-up. She states that home health came out the day after she was seen in our clinic and yesterday to do the wrap change. She denies signs of infection. She reports excoriated skin on the ankle. 3/8; patient presents for follow-up. She has no issues or complaints today. She denies signs of infection. 3/15; patient presents for follow-up. Home health has been coming out to change the dressings. She reports more tenderness to the wound site. She denies purulent drainage, increased warmth or erythema to the area. 4/5; patient presents for follow-up. She has missed her last 2 clinic appointments. I have not seen her in 3 weeks. She was recently hospitalized for altered mental status. She was involuntarily committed. She was evaluated by psychiatry and deemed to have competency. There was no specific cause of her altered mental status. It was concluded that her physical and mental health were declining due to her chronic medical conditions. Currently home health has been coming out for dressing changes. Patient has also been doing her own  dressing changes. She reports more skin breakdown to the periwound and now has a new wound. She denies fever/chills. She reports continued tenderness to the wound site. 4/12; patient with significant venous insufficiency and a large wound on her left lower leg taking up about 80% of the circumference of her lower leg. Cultures of this grew MRSA and Pseudomonas. She had completed a course of ciprofloxacin now is starting doxycycline. She has been using Dakin's wet-to-dry and a Tubigrip. She has home health twice a week and we change it once. 4/19; patient presents for follow-up. She completed her course of doxycycline. She has been using Dakin's wet-to-dry dressing and Tubigrip. Home health changes the dressing twice weekly. Currently she has no issues or complaints. 4/26; patient presents for follow-up. At last clinic visit orders for home health were Iodosorb under compression therapy. Unfortunately they did not have the dressing and have been using Dakin's and gentamicin under the wrap. Patient currently denies signs of infection. She has no issues or complaints today. 5/3; patient presents for follow-up. Again Iodosorb has not been used under the compression therapy when home health comes out to change the wrap and dressing. They have been using Sorbact. It is unclear why this is happening since we send orders weekly to the agency. She denies signs of infection. Patient has not purchased the [place] antibiotics. We reached out to the company and they said they have been trying to contact her on a regular basis. We gave the patient the number to call to order the medication. 5/10; patient presents for follow-up. She has no issues or complaints today. Again home health has not been using Iodosorb. Mepilex was  on the wound bed. No other dressings noted. She brought in her Keystone antibiotics. She denies signs of infection. 5/17; patient presents for follow-up. Home health has come out twice since  she was last seen. Joint well she has been using Keystone antibiotic with Sorbact under the compression wrap. She has no issues or complaints today. She denies signs of infection. 5/24; patient presents for follow-up. We have been using Keystone antibiotics with Sorbact under compression therapy. She is tolerating the treatment well. She is reporting improvement in wound healing. She denies signs of infection. 5/31; patient presents for follow-up. We continue to do [facility] antibiotics with Sorbact under compression therapy. She continues to report improvement in wound healing. Home health comes out and changes the dressing once weekly. 05-17-2022 upon evaluation today patient appears to be doing better in regard to her wound especially compared to the last time I saw her. Fortunately I do think that she is seeing improvements. With that being said I do believe that she may be benefit from sharp debridement today to clear away some of the necrotic debris I discussed that with her as well. She is an amendable to that plan. Otherwise she is very pleased with how the [employer] is doing for her. 6/14; patient presents for follow-up. We have been using Keystone antibiotic with Sorbact and absorbent dressings under 3 layer compression. She has no Laura, Santiago ([id number]) [id number] Page 9 of 12 issues or complaints today. She reports improvement in wound healing. She denies signs of infection. 6/21; patient presents for follow-up. We are continuing with [facility] antibiotic and Sorbact under 3 layer compression. Patient has no complaints. Continued wound healing is happening. She denies signs of infection. 6/28; patient presents for follow-up. We have been using Keystone antibiotic with Sorbact under 3 layer compression. Usually home health comes out and changes the dressing twice a week. Unfortunately they did not go out to change the dressing. It is unclear why. Patient did  not call them. She currently denies signs of infection. 7/5; patient presents for follow-up. We have been using Keystone antibiotic with calcium alginate under 3 layer compression. She reports improvement in wound healing. She denies signs of infection. Home health has come out to do dressing changes twice this past week. 7/12; patient presents for follow-up. We have been using Keystone antibiotic with calcium alginate under 3 layer compression. Patient states that home health came out once last week to change the dressing. She reports improvement in wound healing. She currently denies signs of infection. 7/19; patient presents for follow-up. We have been using Keystone antibiotic with calcium alginate under 3 layer compression. Home health came out once last week to change the dressing. She has no issues or complaints today. She denies signs of infection. 8/2; patient presents for follow-up. We have been using Keystone antibiotic with calcium alginate under 3 layer compression. Unfortunately she missed her appointment last week and home health did not come out to do dressing changes. Patient currently denies signs of infection. 8/9; patient presents for follow-up. We have been using Keystone with calcium alginate under 3 layer compression. She states that home health came out once last week. She currently denies signs of infection. Her wrap was completely wet. She states she was cleaning the top of the leg and water soaked down into the wrap. 8/16; patient presents for follow-up. We have been using Keystone with calcium alginate under 3 layer compression. She states that home health came out twice last week. She has  no issues or complaints today. 8/23; patient presents for follow-up. He has been using Keystone with calcium alginate under 3 layer compression. Home health came out twice last week. She denies signs of infection. 8/30; patient presents for follow-up. We have been using Keystone with  calcium alginate under 3 layer compression. Home health came out once last week to change the dressing. Patient reports improvement in wound healing. She states she is almost done with her chemotherapy infusions and has 1 more left. 9/13; patient presents for follow-up. She has lost the capsules to her [facility] antibiotic which I believe is the vancomycin pills. She has her Zosyn powder today. We have been using Keystone antibiotic ointment with calcium alginate under 3 layer compression. She is concerned about systemic infection however her vitals are stable and there is no surrounding soft tissue infection. She would like to remain a patient in our wound care center however would like a second opinion for her wound care at another facility. She asked to be referred to [facility] wound care center. 9/20; patient presents for follow-up. She found her vancomycin capsules and brought in her complete Keystone antibiotic ointment set today. Unfortunately she has developed skin breakdown and Erythema to the right lower extremity With increased swelling. She states she went to a pow wow Over the weekend and was on her feet for extended periods of time. She saw her oncologist yesterday who prescribed her doxycycline for her right lower extremity erythema. 9/27; patient presents for follow-up. We have been using Keystone antibiotic with Aquacel under 3 layer compression to the lower extremities bilaterally. When home health came and changed the wrap she secretly put coffee into the spray mix along with [facility] antibiotic on her leg thinking the acidic component would better activate the zoysn (sonething she discussed with her microbiologist brother). She has reported improvement in wound healing. 10/4; patient presents for follow-up. She has no issues or complaints today. We have been doing Aquacel and keystone under 3 layer compression to the lower extremities bilaterally. This morning she took the right lower  extremity wrap off as it was uncomfortable. She has no open wounds to this leg. 10/11; patient presents for follow-up. We have been doing Aquacel with Keystone antibiotic ointment under 3 layer compression to the left lower extremity. She developed a small blister to the anterior aspect of the left leg noticed when the wrap was taken off on intake. She currently denies signs of infection. 10/18; patient presents for follow-up. We have been doing Aquacel with Keystone antibiotic ointment under 3 layer compression to the left lower extremity. There has been continued improvement in wound healing. She denies signs of infection. Objective Constitutional Vitals Time Taken: 10:43 AM, Height: 66 in, Weight: 153 lbs, BMI: 24.7, Temperature: 98.1 F, Pulse: 70 bpm, Respiratory Rate: 18 breaths/min, Blood Pressure: 137/75 mmHg. General Notes: Left lower extremity: Open wound with granulation tissue and nonviable tissue to the distal aspect that used to be circumferentially but now divided by islands of epithelization. Integumentary (Hair, Skin) Wound #1 status is Open. Original cause of wound was Gradually Appeared. The date acquired was: 04/06/2021. The wound has been in treatment 64 weeks. The wound is located on the Left,Medial Lower Leg. The wound measures 6.5cm length x 10.3cm width x 0.2cm depth; 52.582cm^2 area and 10.516cm^3 volume. There is Fat Layer (Subcutaneous Tissue) exposed. There is a medium amount of serosanguineous drainage noted. The wound margin is flat and intact. There is medium (34-66%) red, pink granulation within the  wound bed. There is a medium (34-66%) amount of necrotic tissue within the wound bed including Adherent Slough. Assessment Active Problems ICD-10 Laura, Santiago ([id number]) [id number] Page 10 of 12 Non-pressure chronic ulcer of other part of left lower leg with fat layer exposed Chronic venous hypertension (idiopathic) with ulcer of  left lower extremity Chronic venous hypertension (idiopathic) with ulcer of right lower extremity Venous insufficiency (chronic) (peripheral) Long term (current) use of anticoagulants Essential (primary) hypertension Secondary malignant neoplasm of breast Patient's wound has shown improvement in size and appearance since last clinic visit. I debrided nonviable tissue. I recommended continuing the course with [facility] antibiotic spray and silver alginate under 3 layer compression. Previous blister site T the anterior leg has epithelialized. o Procedures Wound #1 Pre-procedure diagnosis of Wound #1 is a Venous Leg Ulcer located on the Left,Medial Lower Leg .Severity of Tissue Pre Debridement is: Fat layer exposed. There was a Excisional Skin/Subcutaneous Tissue Debridement with a total area of 66.95 sq cm performed by Kalman Shan, MD. With the following instrument(s): Curette to remove Viable and Non-Viable tissue/material. Material removed includes Subcutaneous Tissue and Slough and. A time out was conducted at 11:07, prior to the start of the procedure. A Minimum amount of bleeding was controlled with Pressure. The procedure was tolerated well. Santiago Debridement Measurements: 6.5cm length x 10.3cm width x 0.2cm depth; 10.516cm^3 volume. Character of Wound/Ulcer Santiago Debridement is stable. Severity of Tissue Santiago Debridement is: Fat layer exposed. Santiago procedure Diagnosis Wound #1: Same as Pre-Procedure Pre-procedure diagnosis of Wound #1 is a Venous Leg Ulcer located on the Left,Medial Lower Leg . There was a Three Layer Compression Therapy Procedure with a pre-treatment ABI of 1.5 by [name]. Santiago procedure Diagnosis Wound #1: Same as Pre-Procedure Plan Follow-up Appointments: Return Appointment in 1 week. Nurse Visit as needed Home Health: [place]: - [facility] for wound care. May utilize formulary equivalent dressing for wound treatment orders  unless otherwise specified. Home Health Nurse may visit PR[facility] to address patientoos wound care needs. **Please direct any NON-WOUND related issues/requests for orders to patient's Primary Care Physician. **If current dressing causes regression in wound condition, may D/C ordered dressing product/s and apply Normal Saline Moist Dressing daily until next [place] or Other MD appointment. **Notify Wound Healing Center of regression in wound condition at [phone]. Other Home Health Orders/Instructions: - Dressing change 3 x weekly, twice by home health and once at wound clinic weekly. PLEASE make sure frequency between dressing changes is appropriate. Bathing/ Shower/ Hygiene: May shower with wound dressing protected with water repellent cover or cast protector. No tub bath. Anesthetic (Use 'Patient Medications' Section for Anesthetic Order Entry): Lidocaine applied to wound bed Edema Control - Lymphedema / Segmental Compressive Device / Other: Optional: One layer of unna paste to top of compression wrap (to act as an anchor). - PLEASE when applying wrap start from toes and go up to just below knee 3 Layer Compression System for Lymphedema. - left lower leg Tubigrip single layer applied. - Tubi D single right leg Elevate, Exercise Daily and Avoid Standing for Long Periods of Time. Elevate legs to the level of the heart and pump ankles as often as possible Elevate leg(s) parallel to the floor when sitting. DO YOUR BEST to sleep in the bed at night. DO NOT sleep in your recliner. Long hours of sitting in a recliner leads to swelling of the legs and/or potential wounds on your backside. Additional Orders / Instructions:  Follow Nutritious Diet and Increase Protein Intake Medications-Please add to medication list.: Keystone Compound WOUND #1: - Lower Leg Wound Laterality: Left, Medial Cleanser: Wound Cleanser (Home Health) 3 x Per Week/30 Days Discharge Instructions: Wash your hands  with soap and water. Remove old dressing, discard into plastic bag and place into trash. Cleanse the wound with Wound Cleanser prior to applying a clean dressing using gauze sponges, not tissues or cotton balls. Do not scrub or use excessive force. Pat dry using gauze sponges, not tissue or cotton balls. Peri-Wound Care: Nystatin Cream USP 30 (g) 3 x Per Week/30 Days Discharge Instructions: Use Nystatin Cream as directed. MIx 1:1 with TCA cream and applied to peri wound Peri-Wound Care: Triamcinolone Acetonide Cream, 0.1%, 15 (g) tube 3 x Per Week/30 Days Discharge Instructions: Apply as directed. mix 1:1 with nystatin cream for peri wound Prim Dressing: Aquacel Extra Hydrofiber Dressing, 4x5 (in/in) 3 x Per Week/30 Days ary Prim Dressing: keystone compound 3 x Per Week/30 Days ary Discharge Instructions: apply to wound bed as directed on medication label PLEASE follow direction on pill bottle for proper mixing Secondary Dressing: Zetuvit Absorbent Pad, 4x8 (in/in) 3 x Per Week/30 Days Com pression Wrap: 3-LAYER WRAP - Profore Lite LF 3 Multilayer Compression Bandaging System 3 x Per Week/30 Days Discharge Instructions: Apply 3 multi-layer wrap as prescribed. 1. In office sharp debridement MALYNA, BUDNEY ([id number]) [id number] Page 11 of 12 2. Keystone antibiotic spray with silver alginate under 3 layer compression 3. Follow-up in 1 week Electronic Signature(s) Signed: 09/27/2022 11:58:41 AM By: Kalman Shan DO Entered By: Kalman Shan on 09/27/2022 11:17:37 -------------------------------------------------------------------------------- SuperBill Details Patient Name: Date of Service: Laura Santiago, Laura [name] 09/27/2022 Medical Record Number: [id number] Patient Account Number: [id number] Date of Birth/Sex: Treating RN: [birth year] (76 y.o. 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 I87.311 Chronic venous hypertension (idiopathic) with ulcer of right lower extremity I87.2 Venous insufficiency (chronic) (peripheral) Z79.01 Long term (current) use of anticoagulants I10 Essential (primary) hypertension 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 Facility Procedures : CPT4 Code: 78469629 Description: 52841 - DEB SUBQ TISSUE 20 SQ CM/< ICD-10 Diagnosis Description 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s 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 Modifier: ed Quantity: 1 : CPT4 Code: 32440102 Description: 72536 - DEB SUBQ TISS EA ADDL 20CM ICD-10 Diagnosis Description 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s 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

## 2022-09-28 NOTE — Progress Notes (Signed)
Laura Santiago ([id number]) [id number] Page 1 of 9 Visit Report for 09/20/2022 Arrival Information Details Patient Name: Date of Service: Laura Ace. 09/20/2022 10:30 A M Medical Record Number: [id number] Patient Account Number: [id number] Date of Birth/Sex: Treating RN: 10-Mar-1946 (76 y.o. Laura Santiago Primary Care Laura Santiago: [place] Other Clinician: [name] Referring Laura Santiago: Treating Laura Santiago/Extender: Laura Santiago [facility] Weeks in Treatment: 58 Visit Information History Since Last Visit All ordered tests and consults were completed: No Patient Arrived: [name] Added or deleted any medications: No Arrival Time: 10:59 Any new allergies or adverse reactions: No Transfer Assistance: None Had a fall or experienced change in No Patient Requires Transmission-Based No activities of daily living that may affect Precautions: risk of falls: Patient Has Alerts: Yes Hospitalized since last visit: No Patient Alerts: PT HAS SERVICE Pain Present Now: No ANIMAL ABI 07/11/21 R) 1.16 L) 1.27 Electronic Signature(s) Signed: 09/28/2022 11:24:03 AM By: [name] Entered By: [name] on 09/20/2022 10:59:56 -------------------------------------------------------------------------------- Clinic Level of Care Assessment Details Patient Name: Date of Service: Laura Ace. 09/20/2022 10:30 A M Medical Record Number: [id number] Patient Account Number: [id number] Date of Birth/Sex: Treating RN: May 26, 1946 (76 y.o. Laura Santiago Primary Care Laura Santiago: [name] Other Clinician: [name] Referring Laura Santiago: Treating Laura Santiago/Extender: Laura Santiago [facility] Weeks in Treatment: 62 Clinic Level of Care Assessment Items TOOL 1 Quantity Score _0  - 0 Use when EandM and Procedure is performed on INITIAL visit ASSESSMENTS - Nursing Assessment / Reassessment _1  -  0 General Physical Exam (combine w/ comprehensive assessment (listed just below) when performed on new pt. evals) _2  - 0 Comprehensive Assessment (HX, ROS, Risk Assessments, Wounds Hx, etc.) ASSESSMENTS - Wound and Skin Assessment / Reassessment _3  - 0 Dermatologic / Skin Assessment (not related to wound area) Laura Santiago, Laura Santiago ([id number]) [id number] Page 2 of 9 ASSESSMENTS - Ostomy and/or Continence Assessment and Care _4  - 0 Incontinence Assessment and Management _5  - 0 Ostomy Care Assessment and Management (repouching, etc.) PROCESS - Coordination of Care _6  - 0 Simple Patient / Family Education for ongoing care _7  - 0 Complex (extensive) Patient / Family Education for ongoing care _8  - 0 Staff obtains Programmer, systems, Records, T Results / Process Orders est _9  - 0 Staff telephones HHA, Nursing Homes / Clarify orders / etc _10  - 0 Routine Transfer to another Facility (non-emergent condition) _11  - 0 Routine Hospital Admission (non-emergent condition) _12  - 0 New Admissions / Biomedical engineer / Ordering NPWT Apligraf, etc. , _13  - 0 Emergency Hospital Admission (emergent condition) PROCESS - Special Needs _14  - 0 Pediatric / Minor Patient Management _15  - 0 Isolation Patient Management _16  - 0 Hearing / Language / Visual special needs _17  - 0 Assessment of Community assistance (transportation, D/C planning, etc.) _18  - 0 Additional assistance / Altered mentation _19  - 0 Support Surface(s) Assessment (bed, cushion, seat, etc.) INTERVENTIONS - Miscellaneous _20  - 0 External ear exam _21  - 0 Patient Transfer (multiple staff / Civil Service fast streamer / Similar devices) _22  - 0 Simple Staple / Suture removal (25 or less) _23  - 0 Complex Staple / Suture removal (26 or more) _24  - 0 Hypo/Hyperglycemic Management (do not check if billed separately) _25  - 0 Ankle / Brachial Index (ABI) - do not check if billed separately Has the patient been seen at the hospital  within the last three years: Yes Total Score: 0 Level Of Care: ____ Electronic Signature(s) Signed:  09/28/2022 11:24:03 AM By: [name] Entered By: [name] on 09/20/2022 11:23:07 -------------------------------------------------------------------------------- Compression Therapy Details Patient Name: Date of Service: Laura Jewett [name] 09/20/2022 10:30 A M Medical Record Number: [id number] Patient Account Number: [id number] Date of Birth/Sex: Treating RN: 1946/02/12 (76 y.o. Laura Santiago Primary Care Yasseen Salls: [place] Other Clinician: [name] Referring Laura Santiago: Treating Laura Santiago/Extender: Laura Santiago [facility] Weeks in Treatment: 104 Compression Therapy Performed for Wound Assessment: Wound #1 Left,Medial Lower Leg Performed By: Laura Santiago, Laura Santiago, Compression Type: [address] [name], Laura Santiago ([id number]) [id number] Page 3 of 9 Pre Treatment ABI: 1.5 Santiago Procedure Diagnosis Same as Pre-procedure Electronic Signature(s) Signed: 09/28/2022 11:24:03 AM By: [name] Entered By: [name] on 09/20/2022 11:22:27 -------------------------------------------------------------------------------- Encounter Discharge Information Details Patient Name: Date of Service: Laura Jewett [name] 09/20/2022 10:30 A M Medical Record Number: [id number] Patient Account Number: [id number] Date of Birth/Sex: Treating RN: 10-19-1946 (76 y.o. Laura Santiago Primary Care Patryck Kilgore: [place] Other Clinician: [name] Referring Jaeley Wiker: Treating Flavia Bruss/Extender: Laura Santiago [facility] Weeks in Treatment: 75 Encounter Discharge Information Items Santiago Procedure Vitals Discharge Condition: Stable Temperature (F): 98.2 Ambulatory Status: Walker Pulse (bpm): 79 Discharge Destination: Home Respiratory Rate (breaths/min): 18 Transportation: Other Blood Pressure (mmHg):  162/87 Accompanied By: self Schedule Follow-up Appointment: Yes Clinical Summary of Care: Electronic Signature(s) Signed: 09/28/2022 11:24:03 AM By: [name] Entered By: [name] on 09/20/2022 11:24:34 -------------------------------------------------------------------------------- Lower Extremity Assessment Details Patient Name: Date of Service: Laura Jewett [name] 09/20/2022 10:30 A M Medical Record Number: [id number] Patient Account Number: [id number] Date of Birth/Sex: Treating RN: 06-16-1946 (76 y.o. Laura Santiago Primary Care Sianna Garofano: [place] Other Clinician: [name] Referring Cheyene Hamric: Treating Chidinma Clites/Extender: Kalman Shan [facility] Weeks in Treatment: 59 Edema Assessment Assessed: [Left: Yes] [Right: No] Edema: [Left: N] [Right: o] L[LeftCHASITEE, ZENKER ([id number])] [Right: [id number] Page 4 of 9] Calf Left: Right: Point of Measurement: 30 cm From Medial Instep 31.7 cm Ankle Left: Right: Point of Measurement: 10 cm From Medial Instep 23.3 cm Electronic Signature(s) Signed: 09/22/2022 12:25:01 PM By: Carlene Coria RN Signed: 09/28/2022 11:24:03 AM By: [name] Entered By: [name] on 09/20/2022 11:12:57 -------------------------------------------------------------------------------- Multi Wound Chart Details Patient Name: Date of Service: Laura Jewett [name] 09/20/2022 10:30 A M Medical Record Number: [id number] Patient Account Number: [id number] Date of Birth/Sex: Treating RN: 1946/05/10 (76 y.o. Laura Santiago Primary Care Eura Mccauslin: [place] Other Clinician: [name] Referring Gerrit Rafalski: Treating Vishal Sandlin/Extender: Laura Santiago [facility] Weeks in Treatment: 11 Vital Signs Height(in): 66 Pulse(bpm): 79 Weight(lbs): 153 Blood Pressure(mmHg): 162/87 Body Mass Index(BMI): 24.7 Temperature(F): 98.2 Respiratory Rate(breaths/min):  18 [1:Photos:] [N/A:N/A] Left, Medial Lower Leg N/A N/A Wound Location: Gradually Appeared N/A N/A Wounding Event: Venous Leg Ulcer N/A N/A Primary Etiology: Hypertension, Osteoarthritis, ReceivedN/A N/A Comorbid History: Chemotherapy, Received Radiation 04/06/2021 N/A N/A Date Acquired: 79 N/A N/A Weeks of Treatment: Open N/A N/A Wound Status: No N/A N/A Wound Recurrence: Yes N/A N/A Clustered Wound: 6.6x9.7x0.2 N/A N/A Measurements L x W x D (cm) Laura Santiago, Laura Santiago ([id number]) [id number] Page 5 of 9 50.281 N/A N/A A (cm) : rea 10.056 N/A N/A Volume (cm) : 44.30% N/A N/A % Reduction in Area: 44.30% N/A N/A % Reduction in Volume: Full Thickness Without Exposed N/A N/A Classification: Support Structures Medium N/A N/A Exudate Amount: Serosanguineous N/A N/A Exudate Type: red, brown N/A N/A  Exudate Color: Flat and Intact N/A N/A Wound Margin: Medium (34-66%) N/A N/A Granulation Amount: Red, Pink N/A N/A Granulation Quality: Medium (34-66%) N/A N/A Necrotic Amount: Fat Layer (Subcutaneous Tissue): Yes N/A N/A Exposed Structures: Fascia: No Tendon: No Muscle: No Joint: No Bone: No Small (1-33%) N/A N/A Epithelialization: Treatment Notes Electronic Signature(s) Signed: 09/28/2022 11:24:03 AM By: [name] Entered By: [name] on 09/20/2022 11:13:14 -------------------------------------------------------------------------------- Multi-Disciplinary Care Plan Details Patient Name: Date of Service: Laura Santiago, Laura Santiago [name] 09/20/2022 10:30 A M Medical Record Number: [id number] Patient Account Number: [id number] Date of Birth/Sex: Treating RN: 07/13/1946 (76 y.o. Laura Santiago Primary Care Jancarlos Thrun: [place] Other Clinician: [name] Referring Dorri Ozturk: Treating Marietta Sikkema/Extender: Laura Santiago [facility] Weeks in Treatment: 64 Active Inactive Soft Tissue Infection Nursing  Diagnoses: Impaired tissue integrity Potential for infection: soft tissue Goals: Patient's soft tissue infection will resolve Date Initiated: 03/15/2022 Target Resolution Date: 04/27/2022 Goal Status: Active Signs and symptoms of infection will be recognized early to allow for prompt treatment Date Initiated: 03/15/2022 Date Inactivated: 04/26/2022 Target Resolution Date: 04/27/2022 Goal Status: Met Interventions: Assess signs and symptoms of infection every visit Treatment Activities: Culture and sensitivity : 03/15/2022 Notes: Wound/Skin Impairment Laura Santiago, Laura Santiago ([id number]) [id number] Page 6 of 9 Nursing Diagnoses: Impaired tissue integrity Goals: Patient/caregiver will verbalize understanding of skin care regimen Date Initiated: 07/06/2021 Date Inactivated: 07/27/2021 Target Resolution Date: 07/06/2021 Goal Status: Met Ulcer/skin breakdown will have a volume reduction of 30% by week 4 Date Initiated: 07/06/2021 Date Inactivated: 10/12/2021 Target Resolution Date: 08/06/2021 Goal Status: Unmet Unmet Reason: cont tx Ulcer/skin breakdown will have a volume reduction of 50% by week 8 Date Initiated: 07/06/2021 Target Resolution Date: 09/06/2021 Goal Status: Active Ulcer/skin breakdown will have a volume reduction of 80% by week 12 Date Initiated: 07/06/2021 Target Resolution Date: 10/06/2021 Goal Status: Active Ulcer/skin breakdown will heal within 14 weeks Date Initiated: 07/06/2021 Target Resolution Date: 11/06/2021 Goal Status: Active Interventions: Assess patient/caregiver ability to obtain necessary supplies Assess patient/caregiver ability to perform ulcer/skin care regimen upon admission and as needed Assess ulceration(s) every visit Treatment Activities: Referred to DME Joice Nazario for dressing supplies : 07/06/2021 Skin care regimen initiated : 07/06/2021 Notes: Electronic Signature(s) Signed: 09/22/2022 12:25:01 PM By: Carlene Coria RN Signed:  09/28/2022 11:24:03 AM By: [name] Entered By: [name] on 09/20/2022 11:13:03 -------------------------------------------------------------------------------- Pain Assessment Details Patient Name: Date of Service: Laura Jewett [name] 09/20/2022 10:30 A M Medical Record Number: [id number] Patient Account Number: [id number] Date of Birth/Sex: Treating RN: [birth year] (76 y.o. Laura Santiago Primary Care Courtnee Myer: [place] Other Clinician: [name] Referring Irys Nigh: Treating Emrys Mckamie/Extender: Laura Santiago [facility] [place] in Treatment: 38 Active Problems Location of Pain Severity and Description of Pain Patient Has Paino No Site Locations Laura Santiago, Laura Santiago [place] ([id number]) [id number] Page 7 of 9 Pain Management and Medication Current Pain Management: Electronic Signature(s) Signed: 09/22/2022 12:25:01 PM By: Carlene Coria RN Signed: 09/28/2022 11:24:03 AM By: [name] Entered By: [name] on 09/20/2022 11:02:32 -------------------------------------------------------------------------------- Patient/Caregiver Education Details Patient Name: Date of Service: Laura Jewett [name] 10/11/2023andnbsp10:30 A M Medical Record Number: [id number] Patient Account Number: [id number] Date of Birth/Gender: Treating RN: 1945/12/19 (76 y.o. Laura Santiago Primary Care Physician: [place] Other Clinician: [name] Referring Physician: Treating Physician/Extender: Hoffman, Lynn Weeks in Treatment: 31 Education Assessment Education Provided To: Patient Education Topics Provided Wound/Skin Impairment: Handouts: Other: continue wound care  as directed Methods: Explain/Verbal Responses: State content correctly Electronic Signature(s) Signed: 09/28/2022 11:24:03 AM By: [name] Entered By: [name] on 09/20/2022 11:23:42 Laura Santiago ([id number])  [id number] Page 8 of 9 -------------------------------------------------------------------------------- Wound Assessment Details Patient Name: Date of Service: Laura Ace. 09/20/2022 10:30 A M Medical Record Number: [id number] Patient Account Number: [id number] Date of Birth/Sex: Treating RN: 1946-04-30 (76 y.o. Laura Santiago Primary Care Josephene Marrone: [place] Other Clinician: [name] Referring Derryck Shahan: Treating Merlinda Wrubel/Extender: Laura Santiago [facility] Weeks in Treatment: 35 Wound Status Wound Number: 1 Primary Venous Leg Ulcer Etiology: Wound Location: Left, Medial Lower Leg Wound Status: Open Wounding Event: Gradually Appeared Comorbid Hypertension, Osteoarthritis, Received Chemotherapy, Date Acquired: 04/06/2021 History: Received Radiation Weeks Of Treatment: 63 Clustered Wound: Yes Photos Wound Measurements Length: (cm) 6.6 Width: (cm) 9.7 Depth: (cm) 0.2 Area: (cm) 50.281 Volume: (cm) 10.056 % Reduction in Area: 44.3% % Reduction in Volume: 44.3% Epithelialization: Small (1-33%) Wound Description Classification: Full Thickness Without Exposed Suppor Wound Margin: Flat and Intact Exudate Amount: Medium Exudate Type: Serosanguineous Exudate Color: red, brown t Structures Foul Odor After Cleansing: No Slough/Fibrino Yes Wound Bed Granulation Amount: Medium (34-66%) Exposed Structure Granulation Quality: Red, Pink Fascia Exposed: No Necrotic Amount: Medium (34-66%) Fat Layer (Subcutaneous Tissue) Exposed: Yes Necrotic Quality: Adherent Slough Tendon Exposed: No Muscle Exposed: No Joint Exposed: No Bone Exposed: No Electronic Signature(s) Signed: 09/22/2022 12:25:01 PM By: Carlene Coria RN Signed: 09/28/2022 11:24:03 AM By: [name] Entered By: [name] on 09/20/2022 11:11:47 Laura Santiago ([id number]) [id number] Page 9 of  9 -------------------------------------------------------------------------------- Vitals Details Patient Name: Date of Service: Laura Ace. 09/20/2022 10:30 A M Medical Record Number: [id number] Patient Account Number: [id number] Date of Birth/Sex: Treating RN: 23-Mar-1946 (76 y.o. Laura Santiago Primary Care Jeris Easterly: [place] Other Clinician: [name] Referring Tyreck Bell: Treating Jaylyne Breese/Extender: Laura Santiago [facility] Weeks in Treatment: 57 Vital Signs Time Taken: 11:00 Temperature (F): 98.2 Height (in): 66 Pulse (bpm): 79 Weight (lbs): 153 Respiratory Rate (breaths/min): 18 Body Mass Index (BMI): 24.7 Blood Pressure (mmHg): 162/87 Reference Range: 80 - 120 mg / dl Electronic Signature(s) Signed: 09/28/2022 11:24:03 AM By: [name] Entered By: [name] on 09/20/2022 11:02:28

## 2022-09-28 NOTE — Progress Notes (Signed)
KRISHAWNA, STIEFEL ([id number]) [id number] Page 1 of 11 Visit Report for 09/20/2022 Chief Complaint Document Details Patient Name: Date of Service: Laura Ace. 09/20/2022 10:30 A M Medical Record Number: [id number] Patient Account Number: [id number] Date of Birth/Sex: Treating RN: 1946-08-24 (76 y.o. Laura Santiago: [place] Other Clinician: [name] Referring Santiago: Treating Santiago/Extender: Laura Santiago, Laura Santiago Weeks in Treatment: 52 Information Obtained from: Patient Chief Complaint Left lower extremity wound Right toe wounds Left upper lateral thigh wounds Electronic Signature(s) Signed: 09/20/2022 3:09:38 PM By: Kalman Shan DO Entered By: Kalman Shan on 09/20/2022 11:45:40 -------------------------------------------------------------------------------- Debridement Details Patient Name: Date of Service: Laura Santiago Laura M. 09/20/2022 10:30 A M Medical Record Number: [id number] Patient Account Number: [id number] Date of Birth/Sex: Treating RN: 10-06-1946 (76 y.o. Laura Santiago: [place] Other Clinician: [name] Referring Santiago: Treating Santiago/Extender: Laura Santiago, Laura Santiago Weeks in Treatment: 63 Debridement Performed for Assessment: Wound #1 Left,Medial Lower Leg Performed By: Physician Kalman Shan, MD Debridement Type: Debridement Severity of Tissue Pre Debridement: Fat layer exposed Level of Consciousness (Pre-procedure): Awake and Alert Pre-procedure Verification/Time Out Yes - 11:19 Taken: Start Time: 11:19 T Area Debrided (L x W): otal 6 (cm) x 5 (cm) = 30 (cm) Tissue and other material debrided: Viable, Non-Viable, Slough, Subcutaneous, Slough Level: Skin/Subcutaneous Tissue Debridement Description: Excisional Instrument: Curette Bleeding: Minimum Hemostasis Achieved:  Pressure End Time: 11:21 Response to Treatment: Procedure was tolerated well Laura Santiago, Laura Santiago ([id number]) [id number] Page 2 of 11 Level of Consciousness (Post- Awake and Alert procedure): Post Debridement Measurements of Total Wound Length: (cm) 6.6 Width: (cm) 9.7 Depth: (cm) 0.2 Volume: (cm) 10.056 Character of Wound/Ulcer Post Debridement: Stable Severity of Tissue Post Debridement: Fat layer exposed Post Procedure Diagnosis Same as Pre-procedure Electronic Signature(s) Signed: 09/20/2022 3:09:38 PM By: Kalman Shan DO Signed: 09/22/2022 12:25:01 PM By: Carlene Coria RN Signed: 09/28/2022 11:24:03 AM By: [name] Entered By: [name] on 09/20/2022 11:21:58 -------------------------------------------------------------------------------- HPI Details Patient Name: Date of Service: Laura Santiago Laura M. 09/20/2022 10:30 A M Medical Record Number: [id number] Patient Account Number: [id number] Date of Birth/Sex: Treating RN: 05-26-1946 (76 y.o. Laura Santiago: [place] Other Clinician: [name] Referring Santiago: Treating Santiago/Extender: Kalman Shan [facility] DLE Santiago, Laura Santiago Weeks in Treatment: 3 History of Present Illness HPI Description: Admission 7/27 Ms. Tila Millirons is a 76 year old female with a past medical history of ADHD, metastatic breast cancer, stage IV chronic kidney disease, history of DVT on Xarelto and chronic venous insufficiency that presents to the Santiago for a chronic left lower extremity wound. She recently moved to [facility] Laura Santiago 4 days ago. She was being followed by wound care center in [name]. She reports a 10-year history of wounds to her left lower extremity that eventually do heal with debridement and compression therapy. She states that the current wound reopened 4 months ago and she is using Vaseline and Coban. She denies signs of infection. 8/3;  patient presents for 1 week follow-up. She reports no issues or complaints today. She states she had vascular studies done in the last week. She denies signs of infection. She brought her little service dog with her today. 8/17; patient presents for follow-up. She has missed her last Santiago appointment. She states she took the wrap off and attempted to rewrap her leg. She is having difficulty with transportation. She has her service  dog with her today. Overall she feels well and reports improvement in wound healing. She denies signs of infection. She reports owning an old Velcro wrap compression and has this at her living facility 9/14; patient presents for follow-up. Patient states that over the past 2 to 3 weeks she developed toe wounds to her right foot. She attributes this to tight fitting shoes. She subsequently developed cellulitis in the right leg and has been treated by doxycycline by her oncologist. She reports improvement in symptoms however continues to have some redness and swelling to this leg. T the left lower extremity patient has been having her wraps changed with home health twice weekly. She states that the [facility] is not helping control o the drainage. Other than that she has no issues or complaints today. She denies signs of infection to the left lower extremity. 9/21; patient presents for follow-up. She reports seeing infectious disease for her cellulitis. She reports no further management. She has home health that changes the wraps twice weekly. She has no issues or complaints today. She denies signs of infection. 10/5; patient presents for follow-up. She has no issues or complaints today. She denies signs of infection. She states that the right great toe has not been dressed by home health. 10/12; patient presents for follow-up. She has no issues or complaints today. She reports improvement in her wound healing. She has been using silver alginate to the right great toe  wound. She denies signs of infection. 10/26; patient presents for follow-up. Home health did not have sorbact so they continued to use Hydrofera Blue under the wrap. She has been using silver alginate to the great toe wound however she did not have a dressing in place today. She currently denies signs of infection. 11/2; patient presents for follow-up. She has been using sorb act under the compression wrap. She reports using silver alginate to the toe wound again she does not have a dressing in place. She currently denies signs of infection. Laura Santiago, Laura Santiago ([id number]) [id number] Page 3 of 11 11/23; patient presents for follow-up. Unfortunately she has missed her last 2 Santiago appointments. She was last seen 3 weeks ago. She did her own compression wrap with Kerlix and Coban yesterday after seeing vein and vascular. She has not been dressing her right great toe wound. She currently denies signs of infection. 11/30; patient presents for 1 week follow-up. She states she changed her dressing last week prior to home health and use sorb act with Dakin's and Hydrofera Blue. Home health has changed the dressing as well and they have been using sorbact. T oday she reports increased redness to her right lower extremity. She has a history of cellulitis to this leg. She has been using silver alginate to the right great toe. Unfortunately she had an episode of diarrhea prior to coming in and had feces all over the right leg and to the wrap of her left leg. 12/7; patient presents for 1 week follow-up. She states that home health did not come out to change the dressing and she took it off yesterday. It is unclear if she is dressing the right toe wound. She denies signs of infection. 12/14; patient presents for 1 week follow-up. She has no issues or complaints today. 12/21; patient presents for follow-up. She has no issues or complaints today. She denies signs of  infection. 12/28/2021; patient presents for follow-up. She was hospitalized for sepsis secondary to right lower extremity cellulitis On 12/23. She states she is  currently at a SNF. She states that she was started on doxycycline this morning for her right great toe swelling and redness. She is not sure what dressings have been done to her left lower extremity for the past 3 weeks. She says its been mainly gauze with an Ace wrap. 1/25; patient presents for follow-up. She is still residing in a skilled nursing facility. She reports mild pain to the left lower extremity wound bed. She states she is going to see a podiatrist soon. 2/8; patient presents for follow-up. She has moved back to her residential community from her skilled nursing facility. She has no issues or complaints today. She denies signs of systemic infections. 2/15; patient presents for follow-up. He has no issues or complaints today. She denies systemic signs of infection. 2/22; patient presents for follow-up. She has no issues or complaints today. She denies signs of infection. 3/1; patient presents for follow-up. She states that home health came out the day after she was seen in our Santiago and yesterday to do the wrap change. She denies signs of infection. She reports excoriated skin on the ankle. 3/8; patient presents for follow-up. She has no issues or complaints today. She denies signs of infection. 3/15; patient presents for follow-up. Home health has been coming out to change the dressings. She reports more tenderness to the wound site. She denies purulent drainage, increased warmth or erythema to the area. 4/5; patient presents for follow-up. She has missed her last 2 Santiago appointments. I have not seen her in 3 weeks. She was recently hospitalized for altered mental status. She was involuntarily committed. She was evaluated by psychiatry and deemed to have competency. There was no specific cause of her altered mental status. It  was concluded that her physical and mental health were declining due to her chronic medical conditions. Currently home health has been coming out for dressing changes. Patient has also been doing her own dressing changes. She reports more skin breakdown to the periwound and now has a new wound. She denies fever/chills. She reports continued tenderness to the wound site. 4/12; patient with significant venous insufficiency and a large wound on her left lower leg taking up about 80% of the circumference of her lower leg. Cultures of this grew MRSA and Pseudomonas. She had completed a course of ciprofloxacin now is starting doxycycline. She has been using Dakin's wet-to-dry and a Tubigrip. She has home health twice a week and we change it once. 4/19; patient presents for follow-up. She completed her course of doxycycline. She has been using Dakin's wet-to-dry dressing and Tubigrip. Home health changes the dressing twice weekly. Currently she has no issues or complaints. 4/26; patient presents for follow-up. At last Santiago visit orders for home health were Iodosorb under compression therapy. Unfortunately they did not have the dressing and have been using Dakin's and gentamicin under the wrap. Patient currently denies signs of infection. She has no issues or complaints today. 5/3; patient presents for follow-up. Again Iodosorb has not been used under the compression therapy when home health comes out to change the wrap and dressing. They have been using Sorbact. It is unclear why this is happening since we send orders weekly to the agency. She denies signs of infection. Patient has not purchased the [place] antibiotics. We reached out to the company and they said they have been trying to contact her on a regular basis. We gave the patient the number to call to order the medication. 5/10; patient presents for follow-up.  She has no issues or complaints today. Again home health has not been using Iodosorb.  Mepilex was on the wound bed. No other dressings noted. She brought in her Keystone antibiotics. She denies signs of infection. 5/17; patient presents for follow-up. Home health has come out twice since she was last seen. Joint well she has been using Keystone antibiotic with Sorbact under the compression wrap. She has no issues or complaints today. She denies signs of infection. 5/24; patient presents for follow-up. We have been using Keystone antibiotics with Sorbact under compression therapy. She is tolerating the treatment well. She is reporting improvement in wound healing. She denies signs of infection. 5/31; patient presents for follow-up. We continue to do [facility] antibiotics with Sorbact under compression therapy. She continues to report improvement in wound healing. Home health comes out and changes the dressing once weekly. 05-17-2022 upon evaluation today patient appears to be doing better in regard to her wound especially compared to the last time I saw her. Fortunately I do think that she is seeing improvements. With that being said I do believe that she may be benefit from sharp debridement today to clear away some of the necrotic debris I discussed that with her as well. She is an amendable to that plan. Otherwise she is very pleased with how the [employer] is doing for her. 6/14; patient presents for follow-up. We have been using Keystone antibiotic with Sorbact and absorbent dressings under 3 layer compression. She has no issues or complaints today. She reports improvement in wound healing. She denies signs of infection. 6/21; patient presents for follow-up. We are continuing with [facility] antibiotic and Sorbact under 3 layer compression. Patient has no complaints. Continued wound healing is happening. She denies signs of infection. 6/28; patient presents for follow-up. We have been using Keystone antibiotic with Sorbact under 3 layer compression. Usually home health comes out  and changes the dressing twice a week. Unfortunately they did not go out to change the dressing. It is unclear why. Patient did not call them. She currently denies signs of infection. 7/5; patient presents for follow-up. We have been using Keystone antibiotic with calcium alginate under 3 layer compression. She reports improvement in wound healing. She denies signs of infection. Home health has come out to do dressing changes twice this past week. 7/12; patient presents for follow-up. We have been using Keystone antibiotic with calcium alginate under 3 layer compression. Patient states that home health came out once last week to change the dressing. She reports improvement in wound healing. She currently denies signs of infection. 7/19; patient presents for follow-up. We have been using Keystone antibiotic with calcium alginate under 3 layer compression. Home health came out once last ASHTAN, LATON ([id number]) [id number] Page 4 of 11 week to change the dressing. She has no issues or complaints today. She denies signs of infection. 8/2; patient presents for follow-up. We have been using Keystone antibiotic with calcium alginate under 3 layer compression. Unfortunately she missed her appointment last week and home health did not come out to do dressing changes. Patient currently denies signs of infection. 8/9; patient presents for follow-up. We have been using Keystone with calcium alginate under 3 layer compression. She states that home health came out once last week. She currently denies signs of infection. Her wrap was completely wet. She states she was cleaning the top of the leg and water soaked down into the wrap. 8/16; patient presents for follow-up. We have been using [place] with calcium  alginate under 3 layer compression. She states that home health came out twice last week. She has no issues or complaints today. 8/23; patient presents for follow-up. He has  been using Keystone with calcium alginate under 3 layer compression. Home health came out twice last week. She denies signs of infection. 8/30; patient presents for follow-up. We have been using Keystone with calcium alginate under 3 layer compression. Home health came out once last week to change the dressing. Patient reports improvement in wound healing. She states she is almost done with her chemotherapy infusions and has 1 more left. 9/13; patient presents for follow-up. She has lost the capsules to her [facility] antibiotic which I believe is the vancomycin pills. She has her Zosyn powder today. We have been using Keystone antibiotic ointment with calcium alginate under 3 layer compression. She is concerned about systemic infection however her vitals are stable and there is no surrounding soft tissue infection. She would like to remain a patient in our wound care center however would like a second opinion for her wound care at another facility. She asked to be referred to [facility] wound care center. 9/20; patient presents for follow-up. She found her vancomycin capsules and brought in her complete Keystone antibiotic ointment set today. Unfortunately she has developed skin breakdown and Erythema to the right lower extremity With increased swelling. She states she went to a pow wow Over the weekend and was on her feet for extended periods of time. She saw her oncologist yesterday who prescribed her doxycycline for her right lower extremity erythema. 9/27; patient presents for follow-up. We have been using Keystone antibiotic with Aquacel under 3 layer compression to the lower extremities bilaterally. When home health came and changed the wrap she secretly put coffee into the spray mix along with [facility] antibiotic on her leg thinking the acidic component would better activate the zoysn (sonething she discussed with her microbiologist brother). She has reported improvement in wound healing. 10/4; patient  presents for follow-up. She has no issues or complaints today. We have been doing Aquacel and keystone under 3 layer compression to the lower extremities bilaterally. This morning she took the right lower extremity wrap off as it was uncomfortable. She has no open wounds to this leg. 10/11; patient presents for follow-up. We have been doing Aquacel with Keystone antibiotic ointment under 3 layer compression to the left lower extremity. She developed a small blister to the anterior aspect of the left leg noticed when the wrap was taken off on intake. She currently denies signs of infection. Electronic Signature(s) Signed: 09/20/2022 3:09:38 PM By: Kalman Shan DO Entered By: Kalman Shan on 09/20/2022 11:47:01 -------------------------------------------------------------------------------- Physical Exam Details Patient Name: Date of Service: Laura Santiago Laura M. 09/20/2022 10:30 A M Medical Record Number: [id number] Patient Account Number: [id number] Date of Birth/Sex: Treating RN: Aug 29, 1946 (76 y.o. Laura Santiago: [place] Other Clinician: [name] Referring Santiago: Treating Santiago/Extender: Laura Santiago, Laura Santiago Weeks in Treatment: 59 Constitutional . Cardiovascular . Psychiatric . Notes Left lower extremity: Open wound with granulation tissue and nonviable tissue. Small blister to the anterior aspect with serous fluid once debrided. No signs of surrounding soft tissue infection Electronic Signature(s) Signed: 09/20/2022 3:09:38 PM By: Kalman Shan DO Entered By: Kalman Shan on 09/20/2022 11:47:05 [name] ([id number]) [id number] Page 5 of 11 -------------------------------------------------------------------------------- Physician Orders Details Patient Name: Date of Service: Laura Ace. 09/20/2022 10:30 A M Medical Record Number:  [id number] Patient  Account Number: [id number] Date of Birth/Sex: Treating RN: 07-Dec-1946 (76 y.o. Laura Santiago: [place] Other Clinician: [name] Referring Santiago: Treating Santiago/Extender: Laura Santiago, Laura Santiago Weeks in Treatment: 25 Verbal / Phone Orders: No Diagnosis Coding Follow-up Appointments Return Appointment in 1 week. Nurse Visit as needed Mila Doce: - [place] for wound care. May utilize formulary equivalent dressing for wound treatment orders unless otherwise specified. Home Health Nurse may visit PRN to address patients wound care needs. **Please direct any NON-WOUND related issues/requests for orders to patient's Primary Care Physician. **If current dressing causes regression in wound condition, may D/C ordered dressing product/s and apply Normal Saline Moist Dressing daily until next [place] or Other MD appointment. **Notify Wound Healing Center of regression in wound condition at [phone]. Other Home Health Orders/Instructions: - Dressing change 3 x weekly, twice by home health and once at wound Santiago weekly. PLEASE make sure frequency between dressing changes is appropriate. Bathing/ Shower/ Hygiene May shower with wound dressing protected with water repellent cover or cast protector. No tub bath. Anesthetic (Use 'Patient Medications' Section for Anesthetic Order Entry) Lidocaine applied to wound bed Edema Control - Lymphedema / Segmental Compressive Device / Other Optional: One layer of unna paste to top of compression wrap (to act as an anchor). - PLEASE when applying wrap start from toes and go up to just below knee 3 Layer Compression System for Lymphedema. - left lower leg Tubigrip single layer applied. - Tubi D single right leg Elevate, Exercise Daily and A void Standing for Long Periods of Time. Elevate legs to the level of the heart and pump ankles as  often as possible Elevate leg(s) parallel to the floor when sitting. DO YOUR BEST to sleep in the bed at night. DO NOT sleep in your recliner. Long hours of sitting in a recliner leads to swelling of the legs and/or potential wounds on your backside. Additional Orders / Instructions Follow Nutritious Diet and Increase Protein Intake Medications-Please add to medication list. Keystone Compound Wound Treatment Wound #1 - Lower Leg Wound Laterality: Left, Medial Cleanser: Wound Cleanser (Home Health) 3 x Per Week/30 Days Discharge Instructions: Wash your hands with soap and water. Remove old dressing, discard into plastic bag and place into trash. Cleanse the wound with Wound Cleanser prior to applying a clean dressing using gauze sponges, not tissues or cotton balls. Do not scrub or use excessive force. Pat dry using gauze sponges, not tissue or cotton balls. Peri-Wound Care: Nystatin Cream USP 30 (g) 3 x Per Week/30 Days Discharge Instructions: Use Nystatin Cream as directed. MIx 1:1 with TCA cream and applied to peri wound Peri-Wound Care: Triamcinolone Acetonide Cream, 0.1%, 15 (g) tube 3 x Per Week/30 Days Discharge Instructions: Apply as directed. mix 1:1 with nystatin cream for peri wound Prim Dressing: Aquacel Extra Hydrofiber Dressing, 4x5 (in/in) ary 3 x Per Week/30 Days Prim Dressing: keystone compound ary 3 x Per Week/30 Days VIRJEAN, BOMAN ([id number]) [id number] Page 6 of 11 Discharge Instructions: apply to wound bed as directed on medication label PLEASE follow direction on pill bottle for proper mixing Secondary Dressing: Zetuvit Absorbent Pad, 4x8 (in/in) 3 x Per Week/30 Days Compression Wrap: 3-LAYER WRAP - Profore Lite LF 3 Multilayer Compression Bandaging System 3 x Per Week/30 Days Discharge Instructions: Apply 3 multi-layer wrap as prescribed. Electronic Signature(s) Signed: 09/20/2022 3:09:38 PM By: Kalman Shan DO Entered By:  Kalman Shan on 09/20/2022 11:51:50 -------------------------------------------------------------------------------- Problem List Details Patient Name: Date of Service: Laura Ace. 09/20/2022 10:30 A M Medical Record Number: [id number] Patient Account Number: [id number] Date of Birth/Sex: Treating RN: 08-Jul-1946 (76 y.o. Laura Santiago: [place] Other Clinician: [name] Referring Santiago: Treating Santiago/Extender: Laura Santiago, Laura Santiago Weeks in Treatment: 76 Active Problems ICD-10 Encounter Code Description Active Date MDM Diagnosis [phone] Non-pressure chronic ulcer of other part of left lower leg with fat layer exposed11/23/2022 No Yes 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 11/02/2021 No Yes I87.311 Chronic venous hypertension (idiopathic) with ulcer of right lower extremity 08/30/2022 No Yes I87.2 Venous insufficiency (chronic) (peripheral) 07/06/2021 No Yes Z79.01 Long term (current) use of anticoagulants 07/06/2021 No Yes I10 Essential (primary) hypertension 07/06/2021 No Yes 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 07/06/2021 No Yes Inactive Problems ICD-10 Code Description Active Date Inactive Date S81.802A Unspecified open wound, left lower leg, initial encounter 07/06/2021 07/06/2021 ZANNIE, LOCASTRO ([id number]) [id number] Page 7 of 11 S91.101A Unspecified open wound of right great toe without damage to nail, initial encounter 08/24/2021 08/24/2021 S91.104A Unspecified open wound of right lesser toe(s) without damage to nail, initial encounter 08/24/2021 08/24/2021 Resolved Problems ICD-10 Code Description Active Date Resolved Date S91.104D Unspecified open wound of right lesser toe(s) without damage to nail, subsequent 08/31/2021 08/31/2021 encounter S91.201D Unspecified open wound of right great toe with damage to nail,  subsequent encounter 08/31/2021 08/31/2021 Electronic Signature(s) Signed: 09/20/2022 3:09:38 PM By: Kalman Shan DO Entered By: Kalman Shan on 09/20/2022 11:45:35 -------------------------------------------------------------------------------- Progress Note Details Patient Name: Date of Service: Laura Santiago Laura M. 09/20/2022 10:30 A M Medical Record Number: [id number] Patient Account Number: [id number] Date of Birth/Sex: Treating RN: 31-Jan-1946 (76 y.o. Laura Santiago: Reading Other Clinician: [name] Referring Santiago: Treating Santiago/Extender: Laura Santiago, Laura Santiago Weeks in Treatment: 54 Subjective Chief Complaint Information obtained from Patient Left lower extremity wound Right toe wounds Left upper lateral thigh wounds History of Present Illness (HPI) Admission 7/27 Ms. Aleiah Mohammed is a 76 year old female with a past medical history of ADHD, metastatic breast cancer, stage IV chronic kidney disease, history of DVT on Xarelto and chronic venous insufficiency that presents to the [facility] for a chronic left lower extremity wound. She recently moved to [facility] 4 days ago. She was being followed by wound care center in [name]. She reports a 10-year history of wounds to her left lower extremity that eventually do heal with debridement and compression therapy. She states that the current wound reopened 4 months ago and she is using Vaseline and Coban. She denies signs of infection. 8/3; patient presents for 1 week follow-up. She reports no issues or complaints today. She states she had vascular studies done in the last week. She denies signs of infection. She brought her little service dog with her today. 8/17; patient presents for follow-up. She has missed her last Santiago appointment. She states she took the wrap off and attempted to rewrap her leg. She is having difficulty with transportation. She  has her service dog with her today. Overall she feels well and reports improvement in wound healing. She denies signs of infection. She reports owning an old Velcro wrap compression and has this at her living facility 9/14; patient presents for follow-up. Patient states that over the past 2 to 3 weeks she developed toe wounds to her  right foot. She attributes this to tight fitting shoes. She subsequently developed cellulitis in the right leg and has been treated by doxycycline by her oncologist. She reports improvement in symptoms however continues to have some redness and swelling to this leg. T the left lower extremity patient has been having her wraps changed with home health twice weekly. She states that the [facility] is not helping control o the drainage. Other than that she has no issues or complaints today. She denies signs of infection to the left lower extremity. 9/21; patient presents for follow-up. She reports seeing infectious disease for her cellulitis. She reports no further management. She has home health that changes the wraps twice weekly. She has no issues or complaints today. She denies signs of infection. ABISOLA, CARRERO ([id number]) [id number] Page 8 of 11 10/5; patient presents for follow-up. She has no issues or complaints today. She denies signs of infection. She states that the right great toe has not been dressed by home health. 10/12; patient presents for follow-up. She has no issues or complaints today. She reports improvement in her wound healing. She has been using silver alginate to the right great toe wound. She denies signs of infection. 10/26; patient presents for follow-up. Home health did not have sorbact so they continued to use Hydrofera Blue under the wrap. She has been using silver alginate to the great toe wound however she did not have a dressing in place today. She currently denies signs of infection. 11/2; patient  presents for follow-up. She has been using sorb act under the compression wrap. She reports using silver alginate to the toe wound again she does not have a dressing in place. She currently denies signs of infection. 11/23; patient presents for follow-up. Unfortunately she has missed her last 2 Santiago appointments. She was last seen 3 weeks ago. She did her own compression wrap with Kerlix and Coban yesterday after seeing vein and vascular. She has not been dressing her right great toe wound. She currently denies signs of infection. 11/30; patient presents for 1 week follow-up. She states she changed her dressing last week prior to home health and use sorb act with Dakin's and Hydrofera Blue. Home health has changed the dressing as well and they have been using sorbact. T oday she reports increased redness to her right lower extremity. She has a history of cellulitis to this leg. She has been using silver alginate to the right great toe. Unfortunately she had an episode of diarrhea prior to coming in and had feces all over the right leg and to the wrap of her left leg. 12/7; patient presents for 1 week follow-up. She states that home health did not come out to change the dressing and she took it off yesterday. It is unclear if she is dressing the right toe wound. She denies signs of infection. 12/14; patient presents for 1 week follow-up. She has no issues or complaints today. 12/21; patient presents for follow-up. She has no issues or complaints today. She denies signs of infection. 12/28/2021; patient presents for follow-up. She was hospitalized for sepsis secondary to right lower extremity cellulitis On 12/23. She states she is currently at a SNF. She states that she was started on doxycycline this morning for her right great toe swelling and redness. She is not sure what dressings have been done to her left lower extremity for the past 3 weeks. She says its been mainly gauze with an Ace  wrap. 1/25; patient presents for  follow-up. She is still residing in a skilled nursing facility. She reports mild pain to the left lower extremity wound bed. She states she is going to see a podiatrist soon. 2/8; patient presents for follow-up. She has moved back to her residential community from her skilled nursing facility. She has no issues or complaints today. She denies signs of systemic infections. 2/15; patient presents for follow-up. He has no issues or complaints today. She denies systemic signs of infection. 2/22; patient presents for follow-up. She has no issues or complaints today. She denies signs of infection. 3/1; patient presents for follow-up. She states that home health came out the day after she was seen in our [facility] and yesterday to do the wrap change. She denies signs of infection. She reports excoriated skin on the ankle. 3/8; patient presents for follow-up. She has no issues or complaints today. She denies signs of infection. 3/15; patient presents for follow-up. Home health has been coming out to change the dressings. She reports more tenderness to the wound site. She denies purulent drainage, increased warmth or erythema to the area. 4/5; patient presents for follow-up. She has missed her last 2 Santiago appointments. I have not seen her in 3 weeks. She was recently hospitalized for altered mental status. She was involuntarily committed. She was evaluated by psychiatry and deemed to have competency. There was no specific cause of her altered mental status. It was concluded that her physical and mental health were declining due to her chronic medical conditions. Currently home health has been coming out for dressing changes. Patient has also been doing her own dressing changes. She reports more skin breakdown to the periwound and now has a new wound. She denies fever/chills. She reports continued tenderness to the wound site. 4/12; patient with significant venous insufficiency  and a large wound on her left lower leg taking up about 80% of the circumference of her lower leg. Cultures of this grew MRSA and Pseudomonas. She had completed a course of ciprofloxacin now is starting doxycycline. She has been using Dakin's wet-to-dry and a Tubigrip. She has home health twice a week and we change it once. 4/19; patient presents for follow-up. She completed her course of doxycycline. She has been using Dakin's wet-to-dry dressing and Tubigrip. Home health changes the dressing twice weekly. Currently she has no issues or complaints. 4/26; patient presents for follow-up. At last Santiago visit orders for home health were Iodosorb under compression therapy. Unfortunately they did not have the dressing and have been using Dakin's and gentamicin under the wrap. Patient currently denies signs of infection. She has no issues or complaints today. 5/3; patient presents for follow-up. Again Iodosorb has not been used under the compression therapy when home health comes out to change the wrap and dressing. They have been using Sorbact. It is unclear why this is happening since we send orders weekly to the agency. She denies signs of infection. Patient has not purchased the [place] antibiotics. We reached out to the company and they said they have been trying to contact her on a regular basis. We gave the patient the number to call to order the medication. 5/10; patient presents for follow-up. She has no issues or complaints today. Again home health has not been using Iodosorb. Mepilex was on the wound bed. No other dressings noted. She brought in her Keystone antibiotics. She denies signs of infection. 5/17; patient presents for follow-up. Home health has come out twice since she was last seen. Joint well she  has been using Keystone antibiotic with Sorbact under the compression wrap. She has no issues or complaints today. She denies signs of infection. 5/24; patient presents for follow-up. We  have been using Keystone antibiotics with Sorbact under compression therapy. She is tolerating the treatment well. She is reporting improvement in wound healing. She denies signs of infection. 5/31; patient presents for follow-up. We continue to do [facility] antibiotics with Sorbact under compression therapy. She continues to report improvement in wound healing. Home health comes out and changes the dressing once weekly. 05-17-2022 upon evaluation today patient appears to be doing better in regard to her wound especially compared to the last time I saw her. Fortunately I do think that she is seeing improvements. With that being said I do believe that she may be benefit from sharp debridement today to clear away some of the necrotic debris I discussed that with her as well. She is an amendable to that plan. Otherwise she is very pleased with how the [employer] is doing for her. 6/14; patient presents for follow-up. We have been using Keystone antibiotic with Sorbact and absorbent dressings under 3 layer compression. She has no issues or complaints today. She reports improvement in wound healing. She denies signs of infection. 6/21; patient presents for follow-up. We are continuing with [facility] antibiotic and Sorbact under 3 layer compression. Patient has no complaints. Continued wound healing is happening. She denies signs of infection. Laura Santiago, Laura Santiago ([id number]) [id number] Page 9 of 11 6/28; patient presents for follow-up. We have been using Keystone antibiotic with Sorbact under 3 layer compression. Usually home health comes out and changes the dressing twice a week. Unfortunately they did not go out to change the dressing. It is unclear why. Patient did not call them. She currently denies signs of infection. 7/5; patient presents for follow-up. We have been using Keystone antibiotic with calcium alginate under 3 layer compression. She reports improvement in wound  healing. She denies signs of infection. Home health has come out to do dressing changes twice this past week. 7/12; patient presents for follow-up. We have been using Keystone antibiotic with calcium alginate under 3 layer compression. Patient states that home health came out once last week to change the dressing. She reports improvement in wound healing. She currently denies signs of infection. 7/19; patient presents for follow-up. We have been using Keystone antibiotic with calcium alginate under 3 layer compression. Home health came out once last week to change the dressing. She has no issues or complaints today. She denies signs of infection. 8/2; patient presents for follow-up. We have been using Keystone antibiotic with calcium alginate under 3 layer compression. Unfortunately she missed her appointment last week and home health did not come out to do dressing changes. Patient currently denies signs of infection. 8/9; patient presents for follow-up. We have been using Keystone with calcium alginate under 3 layer compression. She states that home health came out once last week. She currently denies signs of infection. Her wrap was completely wet. She states she was cleaning the top of the leg and water soaked down into the wrap. 8/16; patient presents for follow-up. We have been using Keystone with calcium alginate under 3 layer compression. She states that home health came out twice last week. She has no issues or complaints today. 8/23; patient presents for follow-up. He has been using Keystone with calcium alginate under 3 layer compression. Home health came out twice last week. She denies signs of infection. 8/30; patient presents for  follow-up. We have been using Keystone with calcium alginate under 3 layer compression. Home health came out once last week to change the dressing. Patient reports improvement in wound healing. She states she is almost done with her chemotherapy infusions and  has 1 more left. 9/13; patient presents for follow-up. She has lost the capsules to her [facility] antibiotic which I believe is the vancomycin pills. She has her Zosyn powder today. We have been using Keystone antibiotic ointment with calcium alginate under 3 layer compression. She is concerned about systemic infection however her vitals are stable and there is no surrounding soft tissue infection. She would like to remain a patient in our wound care center however would like a second opinion for her wound care at another facility. She asked to be referred to [facility] wound care center. 9/20; patient presents for follow-up. She found her vancomycin capsules and brought in her complete Keystone antibiotic ointment set today. Unfortunately she has developed skin breakdown and Erythema to the right lower extremity With increased swelling. She states she went to a pow wow Over the weekend and was on her feet for extended periods of time. She saw her oncologist yesterday who prescribed her doxycycline for her right lower extremity erythema. 9/27; patient presents for follow-up. We have been using Keystone antibiotic with Aquacel under 3 layer compression to the lower extremities bilaterally. When home health came and changed the wrap she secretly put coffee into the spray mix along with [facility] antibiotic on her leg thinking the acidic component would better activate the zoysn (sonething she discussed with her microbiologist brother). She has reported improvement in wound healing. 10/4; patient presents for follow-up. She has no issues or complaints today. We have been doing Aquacel and keystone under 3 layer compression to the lower extremities bilaterally. This morning she took the right lower extremity wrap off as it was uncomfortable. She has no open wounds to this leg. 10/11; patient presents for follow-up. We have been doing Aquacel with Keystone antibiotic ointment under 3 layer compression to the left  lower extremity. She developed a small blister to the anterior aspect of the left leg noticed when the wrap was taken off on intake. She currently denies signs of infection. Objective Constitutional Vitals Time Taken: 11:00 AM, Height: 66 in, Weight: 153 lbs, BMI: 24.7, Temperature: 98.2 F, Pulse: 79 bpm, Respiratory Rate: 18 breaths/min, Blood Pressure: 162/87 mmHg. General Notes: Left lower extremity: Open wound with granulation tissue and nonviable tissue. Small blister to the anterior aspect with serous fluid once debrided. No signs of surrounding soft tissue infection Integumentary (Hair, Skin) Wound #1 status is Open. Original cause of wound was Gradually Appeared. The date acquired was: 04/06/2021. The wound has been in treatment 63 weeks. The wound is located on the Left,Medial Lower Leg. The wound measures 6.6cm length x 9.7cm width x 0.2cm depth; 50.281cm^2 area and 10.056cm^3 volume. There is Fat Layer (Subcutaneous Tissue) exposed. There is a medium amount of serosanguineous drainage noted. The wound margin is flat and intact. There is medium (34-66%) red, pink granulation within the wound bed. There is a medium (34-66%) amount of necrotic tissue within the wound bed including Adherent Slough. Assessment Active Problems ICD-10 Non-pressure chronic ulcer of other part of left lower leg with fat layer exposed Chronic venous hypertension (idiopathic) with ulcer of left lower extremity Chronic venous hypertension (idiopathic) with ulcer of right lower extremity Venous insufficiency (chronic) (peripheral) Long term (current) use of anticoagulants Essential (primary) hypertension Secondary malignant neoplasm  of breast Laura Santiago, Laura Santiago ([id number]) [id number] Page 10 of 11 Patient's wound has shown improvement in size and appearance since last Santiago visit. I debrided nonviable tissue. I recommended continuing the course with Aquacel Ag and Keystone  antibiotic spray to the wound beds. There is a small blister that I opened up to the anterior left leg and also recommended putting Aquacel Ag here. Continue under 3 layer compression. No signs of infection . Follow up in one week. 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 There was a Excisional Skin/Subcutaneous Tissue Debridement with a total area of 30 sq cm performed by Kalman Shan, MD. With the following instrument(s): Curette to remove Viable and Non-Viable tissue/material. Material removed includes Subcutaneous Tissue and Slough and. A time out was conducted at 11:19, prior to the start of the procedure. A Minimum amount of bleeding was controlled with Pressure. The procedure was tolerated well. Post Debridement Measurements: 6.6cm length x 9.7cm width x 0.2cm depth; 10.056cm^3 volume. Character of Wound/Ulcer Post Debridement is stable. Severity of Tissue Post Debridement is: Fat layer exposed. 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 There was a Three Layer Compression Therapy Procedure with a pre-treatment ABI of 1.5 by [name]. Post procedure Diagnosis Wound #1: Same as Pre-Procedure Plan Follow-up Appointments: Return Appointment in 1 week. Nurse Visit as needed Home Health: [place]: - [place] for wound care. May utilize formulary equivalent dressing for wound treatment orders unless otherwise specified. Home Health Nurse may visit PRN to address patientoos wound care needs. **Please direct any NON-WOUND related issues/requests for orders to patient's Primary Care Physician. **If current dressing causes regression in wound condition, may D/C ordered dressing product/s and apply Normal Saline  Moist Dressing daily until next Mier or Other MD appointment. **Notify Wound Healing Center of regression in wound condition at [phone]. Other Home Health Orders/Instructions: - Dressing change 3 x weekly, twice by home health and once at wound Santiago weekly. PLEASE make sure frequency between dressing changes is appropriate. Bathing/ Shower/ Hygiene: May shower with wound dressing protected with water repellent cover or cast protector. No tub bath. Anesthetic (Use 'Patient Medications' Section for Anesthetic Order Entry): Lidocaine applied to wound bed Edema Control - Lymphedema / Segmental Compressive Device / Other: Optional: One layer of unna paste to top of compression wrap (to act as an anchor). - PLEASE when applying wrap start from toes and go up to just below knee 3 Layer Compression System for Lymphedema. - left lower leg Tubigrip single layer applied. - Tubi D single right leg Elevate, Exercise Daily and Avoid Standing for Long Periods of Time. Elevate legs to the level of the heart and pump ankles as often as possible Elevate leg(s) parallel to the floor when sitting. DO YOUR BEST to sleep in the bed at night. DO NOT sleep in your recliner. Long hours of sitting in a recliner leads to swelling of the legs and/or potential wounds on your backside. Additional Orders / Instructions: Follow Nutritious Diet and Increase Protein Intake Medications-Please add to medication list.: Keystone Compound WOUND #1: - Lower Leg Wound Laterality: Left, Medial Cleanser: Wound Cleanser (Home Health) 3 x Per Week/30 Days Discharge Instructions: Wash your hands with soap and water. Remove old dressing, discard  into plastic bag and place into trash. Cleanse the wound with Wound Cleanser prior to applying a clean dressing using gauze sponges, not tissues or cotton balls. Do not scrub or use excessive force. Pat dry using gauze sponges, not tissue or cotton balls. Peri-Wound Care:  Nystatin Cream USP 30 (g) 3 x Per Week/30 Days Discharge Instructions: Use Nystatin Cream as directed. MIx 1:1 with TCA cream and applied to peri wound Peri-Wound Care: Triamcinolone Acetonide Cream, 0.1%, 15 (g) tube 3 x Per Week/30 Days Discharge Instructions: Apply as directed. mix 1:1 with nystatin cream for peri wound Prim Dressing: Aquacel Extra Hydrofiber Dressing, 4x5 (in/in) 3 x Per Week/30 Days ary Prim Dressing: keystone compound 3 x Per Week/30 Days ary Discharge Instructions: apply to wound bed as directed on medication label PLEASE follow direction on pill bottle for proper mixing Secondary Dressing: Zetuvit Absorbent Pad, 4x8 (in/in) 3 x Per Week/30 Days Com pression Wrap: 3-LAYER WRAP - Profore Lite LF 3 Multilayer Compression Bandaging System 3 x Per Week/30 Days Discharge Instructions: Apply 3 multi-layer wrap as prescribed. 1. In office sharp debridement 2. Keystone antibiotic spray with Aquacel Ag under 3 layer compression 3. Follow-up in 1 week Electronic Signature(s) Laura Santiago, Laura Santiago ([id number]) [id number] Page 11 of 11 Signed: 09/20/2022 3:09:38 PM By: Kalman Shan DO Entered By: Kalman Shan on 09/20/2022 11:51:28 -------------------------------------------------------------------------------- SuperBill Details Patient Name: Date of Service: [name], BO Laura M. 09/20/2022 Medical Record Number: [id number] Patient Account Number: [id number] Date of Birth/Sex: Treating RN: 08-May-1946 (76 y.o. Laura Santiago: [place] De[place] Other Clinician: [name] Referring Santiago: Treating Santiago/Extender: Laura Santiago, Laura Santiago Weeks in Treatment: 63 Diagnosis Coding ICD-10 Codes Code Description [phone] 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 I87.311  Chronic venous hypertension (idiopathic) with ulcer of right lower extremity I87.2 Venous insufficiency (chronic) (peripheral) Z79.01 Long term (current) use of anticoagulants I10 Essential (primary) hypertension 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 Facility Procedures : CPT4 Code: 81157262 Description: 03559 - DEB SUBQ TISSUE 20 SQ CM/< ICD-10 Diagnosis Description 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 Modifier: sed Quantity: 1 : CPT4 Code: 74163845 Description: 11045 - DEB SUBQ TISS EA ADDL 20CM ICD-10 Diagnosis Description 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

## 2022-09-28 NOTE — Progress Notes (Signed)
PAETON, LATOUCHE ([id number]) [id number] Page 1 of 9 Visit Report for 09/27/2022 Arrival Information Details Patient Name: Date of Service: Laura Santiago. 09/27/2022 10:30 A M Medical Record Number: [id number] Patient Account Number: [id number] Date of Birth/Sex: Treating RN: 10/23/1946 (76 y.o. Laura Santiago Primary Care Roby Donaway: [place] Other Clinician: [name] Referring Chasitie Passey: Treating Calvyn Kurtzman/Extender: [name] Weeks in Treatment: 60 Visit Information History Since Last Visit All ordered tests and consults were completed: No Patient Arrived: [name] Added or deleted any medications: No Arrival Time: 10:36 Any new allergies or adverse reactions: No Transfer Assistance: None Had a fall or experienced change in No Patient Requires Transmission-Based No activities of daily living that may affect Precautions: risk of falls: Patient Has Alerts: Yes Hospitalized since last visit: No Patient Alerts: PT HAS SERVICE Pain Present Now: No ANIMAL ABI 07/11/21 R) 1.16 L) 1.27 Electronic Signature(s) Signed: 09/28/2022 11:23:10 AM By: [name] Entered By: [name] on 09/27/2022 10:43:05 -------------------------------------------------------------------------------- Clinic Level of Care Assessment Details Patient Name: Date of Service: Laura Santiago. 09/27/2022 10:30 A M Medical Record Number: [id number] Patient Account Number: [id number] Date of Birth/Sex: Treating RN: 12/07/1946 (76 y.o. Laura Santiago Primary Care Lynnel Zanetti: [place] Other Clinician: [name] Referring Kaziah Krizek: Treating Rohil Lesch/Extender: [name] Weeks in Treatment: 56 Clinic Level of Care Assessment Items TOOL 1 Quantity Score _0  - 0 Use when EandM and Procedure is performed on INITIAL visit ASSESSMENTS - Nursing Assessment / Reassessment _1  - 0 General  Physical Exam (combine w/ comprehensive assessment (listed just below) when performed on new pt. evals) _2  - 0 Comprehensive Assessment (HX, ROS, Risk Assessments, Wounds Hx, etc.) ASSESSMENTS - Wound and Skin Assessment / Reassessment _3  - 0 Dermatologic / Skin Assessment (not related to wound area) DANN, VENTRESS ([id number]) [id number] Page 2 of 9 ASSESSMENTS - Ostomy and/or Continence Assessment and Care _4  - 0 Incontinence Assessment and Management _5  - 0 Ostomy Care Assessment and Management (repouching, etc.) PROCESS - Coordination of Care _6  - 0 Simple Patient / Family Education for ongoing care _7  - 0 Complex (extensive) Patient / Family Education for ongoing care _8  - 0 Staff obtains Programmer, systems, Records, T Results / Process Orders est _9  - 0 Staff telephones HHA, Nursing Homes / Clarify orders / etc _10  - 0 Routine Transfer to another Facility (non-emergent condition) _11  - 0 Routine Hospital Admission (non-emergent condition) _12  - 0 New Admissions / Biomedical engineer / Ordering NPWT Apligraf, etc. , _13  - 0 Emergency Hospital Admission (emergent condition) PROCESS - Special Needs _14  - 0 Pediatric / Minor Patient Management _15  - 0 Isolation Patient Management _16  - 0 Hearing / Language / Visual special needs _17  - 0 Assessment of Community assistance (transportation, D/C planning, etc.) _18  - 0 Additional assistance / Altered mentation _19  - 0 Support Surface(s) Assessment (bed, cushion, seat, etc.) INTERVENTIONS - Miscellaneous _20  - 0 External ear exam _21  - 0 Patient Transfer (multiple staff / Civil Service fast streamer / Similar devices) _22  - 0 Simple Staple / Suture removal (25 or less) _23  - 0 Complex Staple / Suture removal (26 or more) _24  - 0 Hypo/Hyperglycemic Management (do not check if billed separately) _25  - 0 Ankle / Brachial Index (ABI) - do not check if billed separately Has the patient been seen at the hospital within the last  three years: Yes Total Score: 0 Level Of Care: ____ Electronic Signature(s) Signed:  09/28/2022 11:23:10 AM By: [name] Entered By: [name] on 09/27/2022 11:30:04 -------------------------------------------------------------------------------- Compression Therapy Details Patient Name: Date of Service: Laura Jewett [name] 09/27/2022 10:30 A M Medical Record Number: [id number] Patient Account Number: [id number] Date of Birth/Sex: Treating RN: Oct 16, 1946 (76 y.o. Laura Santiago Primary Care Hayden Mabin: [place] Other Clinician: [name] Referring Saraphina Lauderbaugh: Treating Laura Santiago/Extender: Kalman Shan [facility] Weeks in Treatment: 42 Compression Therapy Performed for Wound Assessment: Wound #1 Left,Medial Lower Leg Performed By: [name], [name], Compression Type: [address] Laura Santiago ([id number]) [id number] Page 3 of 9 Pre Treatment ABI: 1.5 Post Procedure Diagnosis Same as Pre-procedure Electronic Signature(s) Signed: 09/28/2022 11:23:10 AM By: [name] Entered By: [name] on 09/27/2022 11:10:41 -------------------------------------------------------------------------------- Encounter Discharge Information Details Patient Name: Date of Service: Laura Jewett [name] 09/27/2022 10:30 A M Medical Record Number: [id number] Patient Account Number: [id number] Date of Birth/Sex: Treating RN: Feb 02, 1946 (76 y.o. Laura Santiago Primary Care Coralyn Roselli: [place] Other Clinician: [name] Referring Jailynne Opperman: Treating Himmat Enberg/Extender: [name] Weeks in Treatment: 44 Encounter Discharge Information Items Post Procedure Vitals Discharge Condition: Stable Temperature (F): 98.1 Ambulatory Status: Walker Pulse (bpm): 70 Discharge Destination: Home Respiratory Rate (breaths/min): 18 Transportation: Other Blood Pressure (mmHg): 137/75 Accompanied  By: self Schedule Follow-up Appointment: Yes Clinical Summary of Care: Electronic Signature(s) Signed: 09/28/2022 11:23:10 AM By: [name] Entered By: [name] on 09/27/2022 11:32:05 -------------------------------------------------------------------------------- Lower Extremity Assessment Details Patient Name: Date of Service: Laura Santiago. 09/27/2022 10:30 A M Medical Record Number: [id number] Patient Account Number: [id number] Date of Birth/Sex: Treating RN: 12/13/1945 (76 y.o. Laura Santiago Primary Care Brealynn Contino: [place] Other Clinician: [name] Referring Hashir Deleeuw: Treating Joda Braatz/Extender: Kalman Shan [facility] Weeks in Treatment: 61 Edema Assessment Assessed: [Left: Yes] [Right: No] Edema: [Left: Ye] [Right: s] L[LeftVERLEE, POPE ([id number])] [Right: [id number] Page 4 of 9] Calf Left: Right: Point of Measurement: 30 cm From Medial Instep 30.3 cm Ankle Left: Right: Point of Measurement: 10 cm From Medial Instep 21.5 cm Vascular Assessment Pulses: Dorsalis Pedis Palpable: [Left:Yes] Electronic Signature(s) Signed: 09/27/2022 3:41:25 PM By: Gretta Cool, BSN, RN, CWS, [name] RN, BSN Signed: 09/28/2022 11:23:10 AM By: [name] Entered By: [name] on 09/27/2022 10:57:33 -------------------------------------------------------------------------------- Multi Wound Chart Details Patient Name: Date of Service: Laura Jewett [name] 09/27/2022 10:30 A M Medical Record Number: [id number] Patient Account Number: [id number] Date of Birth/Sex: Treating RN: 1946/11/03 (76 y.o. Laura Santiago Primary Care Overton Boggus: [place] Other Clinician: [name] Referring Alivya Wegman: Treating Karielle Davidow/Extender: [name] Weeks in Treatment: 52 Vital Signs Height(in): 66 Pulse(bpm): 70 Weight(lbs): 153 Blood Pressure(mmHg): 137/75 Body Mass Index(BMI):  24.7 Temperature(F): 98.1 Respiratory Rate(breaths/min): 18 [1:Photos:] [N/A:N/A] Left, Medial Lower Leg N/A N/A Wound Location: Gradually Appeared N/A N/A Wounding Event: Venous Leg Ulcer N/A N/A Primary Etiology: Hypertension, Osteoarthritis, ReceivedN/A N/A Comorbid HistoryCHERRYL, BABIN ([id number]) [id number] Page 5 of 9 Chemotherapy, Received Radiation 04/06/2021 N/A N/A Date Acquired: 56 N/A N/A Weeks of Treatment: Open N/A N/A Wound Status: No N/A N/A Wound Recurrence: Yes N/A N/A Clustered Wound: 6.5x10.3x0.2 N/A N/A Measurements L x W x D (cm) 52.582 N/A N/A A (cm) : rea 10.516 N/A N/A Volume (cm) : 41.80% N/A N/A % Reduction in Area: 41.80% N/A N/A % Reduction in Volume: Full Thickness Without Exposed N/A N/A Classification: Support Structures Medium N/A N/A  Exudate Amount: Serosanguineous N/A N/A Exudate Type: red, brown N/A N/A Exudate Color: Flat and Intact N/A N/A Wound Margin: Medium (34-66%) N/A N/A Granulation Amount: Red, Pink N/A N/A Granulation Quality: Medium (34-66%) N/A N/A Necrotic Amount: Fat Layer (Subcutaneous Tissue): Yes N/A N/A Exposed Structures: Fascia: No Tendon: No Muscle: No Joint: No Bone: No Small (1-33%) N/A N/A Epithelialization: Treatment Notes Electronic Signature(s) Signed: 09/28/2022 11:23:10 AM By: [name] Entered By: [name] on 09/27/2022 10:57:56 -------------------------------------------------------------------------------- Multi-Disciplinary Care Plan Details Patient Name: Date of Service: [name], [name] [name] 09/27/2022 10:30 A M Medical Record Number: [id number] Patient Account Number: [id number] Date of Birth/Sex: Treating RN: 1945-12-28 (76 y.o. Laura Santiago Primary Care Marris Frontera: [place] Other Clinician: [name] Referring Pellegrino Kennard: Treating Jerian Morais/Extender: [name] Weeks in Treatment:  37 Active Inactive Soft Tissue Infection Nursing Diagnoses: Impaired tissue integrity Potential for infection: soft tissue Goals: Patient's soft tissue infection will resolve Date Initiated: 03/15/2022 Target Resolution Date: 04/27/2022 Goal Status: Active Signs and symptoms of infection will be recognized early to allow for prompt treatment Date Initiated: 03/15/2022 Date Inactivated: 04/26/2022 Target Resolution Date: 04/27/2022 Goal Status: Met Interventions: Assess signs and symptoms of infection every visit MAYGAN, KOELLER ([id number]) [id number] Page 6 of 9 Treatment Activities: Culture and sensitivity : 03/15/2022 Notes: Wound/Skin Impairment Nursing Diagnoses: Impaired tissue integrity Goals: Patient/caregiver will verbalize understanding of skin care regimen Date Initiated: 07/06/2021 Date Inactivated: 07/27/2021 Target Resolution Date: 07/06/2021 Goal Status: Met Ulcer/skin breakdown will have a volume reduction of 30% by week 4 Date Initiated: 07/06/2021 Date Inactivated: 10/12/2021 Target Resolution Date: 08/06/2021 Goal Status: Unmet Unmet Reason: cont tx Ulcer/skin breakdown will have a volume reduction of 50% by week 8 Date Initiated: 07/06/2021 Target Resolution Date: 09/06/2021 Goal Status: Active Ulcer/skin breakdown will have a volume reduction of 80% by week 12 Date Initiated: 07/06/2021 Target Resolution Date: 10/06/2021 Goal Status: Active Ulcer/skin breakdown will heal within 14 weeks Date Initiated: 07/06/2021 Target Resolution Date: 11/06/2021 Goal Status: Active Interventions: Assess patient/caregiver ability to obtain necessary supplies Assess patient/caregiver ability to perform ulcer/skin care regimen upon admission and as needed Assess ulceration(s) every visit Treatment Activities: Referred to DME Ladrea Holladay for dressing supplies : 07/06/2021 Skin care regimen initiated : 07/06/2021 Notes: Electronic Signature(s) Signed:  09/27/2022 3:41:25 PM By: Gretta Cool, BSN, RN, CWS, [name] RN, BSN Signed: 09/28/2022 11:23:10 AM By: [name] Entered By: [name] on 09/27/2022 10:57:38 -------------------------------------------------------------------------------- Pain Assessment Details Patient Name: Date of Service: Laura Jewett [name] 09/27/2022 10:30 A M Medical Record Number: [id number] Patient Account Number: [id number] Date of Birth/Sex: Treating RN: February 10, 1946 (76 y.o. Laura Santiago Primary Care Lakea Mittelman: [place] Other Clinician: [name] Referring Cristan Scherzer: Treating Maelie Chriswell/Extender: Kalman Shan [facility] Weeks in Treatment: 41 Active Problems Location of Pain Severity and Description of Pain Patient Has Paino No Site Locations ELYSABETH, AUST Dix ([id number]) [id number] Page 7 of 9 Pain Management and Medication Current Pain Management: Electronic Signature(s) Signed: 09/27/2022 3:41:25 PM By: Gretta Cool, BSN, RN, CWS, [name] RN, BSN Signed: 09/28/2022 11:23:10 AM By: [name] Entered By: [name] on 09/27/2022 10:46:19 -------------------------------------------------------------------------------- Patient/Caregiver Education Details Patient Name: Date of Service: Laura Santiago 10/18/2023andnbsp10:30 A M Medical Record Number: [id number] Patient Account Number: [id number] Date of Birth/Gender: Treating RN: [birth year] (76 y.o. Laura Santiago Primary Care Physician: [place] Other Clinician: [name] Referring Physician: Treating Physician/Extender: Hoffman, [place]  in Treatment: 92 Education Assessment Education Provided To: Patient Education Topics Provided Wound/Skin Impairment: Handouts: Other: continue wound care as directed Methods: Explain/Verbal Responses: State content correctly Electronic Signature(s) Signed: 09/28/2022 11:23:10 AM By: [name] Entered  By: [name] on 09/27/2022 11:30:30 [name] ([id number]) [id number] Page 8 of 9 -------------------------------------------------------------------------------- Wound Assessment Details Patient Name: Date of Service: Laura Santiago. 09/27/2022 10:30 A M Medical Record Number: [id number] Patient Account Number: [id number] Date of Birth/Sex: Treating RN: 11/27/1946 (76 y.o. Laura Santiago Primary Care Cianni Manny: [place] Other Clinician: [name] Referring Bria Portales: Treating Micki Cassel/Extender: [name] Weeks in Treatment: 64 Wound Status Wound Number: 1 Primary Venous Leg Ulcer Etiology: Wound Location: Left, Medial Lower Leg Wound Status: Open Wounding Event: Gradually Appeared Comorbid Hypertension, Osteoarthritis, Received Chemotherapy, Date Acquired: 04/06/2021 History: Received Radiation Weeks Of Treatment: 64 Clustered Wound: Yes Photos Wound Measurements Length: (cm) 6.5 Width: (cm) 10.3 Depth: (cm) 0.2 Area: (cm) 52.582 Volume: (cm) 10.516 % Reduction in Area: 41.8% % Reduction in Volume: 41.8% Epithelialization: Small (1-33%) Wound Description Classification: Full Thickness Without Exposed Support Structures Wound Margin: Flat and Intact Exudate Amount: Medium Exudate Type: Serosanguineous Exudate Color: red, brown Foul Odor After Cleansing: No Slough/Fibrino Yes Wound Bed Granulation Amount: Medium (34-66%) Exposed Structure Granulation Quality: Red, Pink Fascia Exposed: No Necrotic Amount: Medium (34-66%) Fat Layer (Subcutaneous Tissue) Exposed: Yes Necrotic Quality: Adherent Slough Tendon Exposed: No Muscle Exposed: No Joint Exposed: No Bone Exposed: No Treatment Notes Wound #1 (Lower Leg) Wound Laterality: Left, Medial Cleanser Wound Cleanser Discharge Instruction: Wash your hands with soap and water. Remove old dressing, discard into plastic bag and place  into trash. Cleanse the wound with Wound Cleanser prior to applying a clean dressing using gauze sponges, not tissues or cotton balls. Do not scrub or use excessive MARCEA, ROJEK ([id number]) [id number] Page 9 of 9 force. Pat dry using gauze sponges, not tissue or cotton balls. Peri-Wound Care Nystatin Cream USP 30 (g) Discharge Instruction: Use Nystatin Cream as directed. MIx 1:1 with TCA cream and applied to peri wound Triamcinolone Acetonide Cream, 0.1%, 15 (g) tube Discharge Instruction: Apply as directed. mix 1:1 with nystatin cream for peri wound Topical Primary Dressing Aquacel Extra Hydrofiber Dressing, 4x5 (in/in) keystone compound Discharge Instruction: apply to wound bed as directed on medication label PLEASE follow direction on pill bottle for proper mixing Secondary Dressing Zetuvit Absorbent Pad, 4x8 (in/in) Secured With Compression Wrap 3-LAYER WRAP - Profore Lite LF 3 Multilayer Compression Bandaging System Discharge Instruction: Apply 3 multi-layer wrap as prescribed. Compression Stockings Add-Ons Electronic Signature(s) Signed: 09/27/2022 3:41:25 PM By: Gretta Cool, BSN, RN, CWS, [name] RN, BSN Signed: 09/28/2022 11:23:10 AM By: [name] Entered By: [name] on 09/27/2022 10:56:27 -------------------------------------------------------------------------------- Vitals Details Patient Name: Date of Service: Laura Jewett [name] 09/27/2022 10:30 A M Medical Record Number: [id number] Patient Account Number: [id number] Date of Birth/Sex: Treating RN: 20-Jul-1946 (76 y.o. Laura Santiago Primary Care Mazelle Huebert: [place] Other Clinician: [name] Referring Laron Angelini: Treating Loree Shehata/Extender: [name] Weeks in Treatment: 51 Vital Signs Time Taken: 10:43 Temperature (F): 98.1 Height (in): 66 Pulse (bpm): 70 Weight (lbs): 153 Respiratory Rate (breaths/min): 18 Body Mass Index (BMI):  24.7 Blood Pressure (mmHg): 137/75 Reference Range: 80 - 120 mg / dl Electronic Signature(s) Signed: 09/28/2022 11:23:10 AM By: [name] Entered By: [name] on 09/27/2022 10:46:07

## 2022-10-02 ENCOUNTER — Other Ambulatory Visit: Payer: Self-pay | Admitting: Oncology

## 2022-10-02 MED ORDER — AMPHETAMINE-DEXTROAMPHET ER 30 MG PO CP24: 30.0000 mg | ORAL_CAPSULE | Freq: Every day | ORAL | 0 refills | Status: DC

## 2022-10-03 MED ORDER — OXYCODONE HCL 10 MG PO TABS: 15.0000 mg | ORAL_TABLET | Freq: Four times a day (QID) | ORAL | 0 refills | Status: DC | PRN

## 2022-10-04 ENCOUNTER — Encounter: Payer: Medicare Other | Admitting: Internal Medicine

## 2022-10-04 DIAGNOSIS — I87313 Chronic venous hypertension (idiopathic) with ulcer of bilateral lower extremity: Secondary | ICD-10-CM | POA: Diagnosis not present

## 2022-10-05 NOTE — Progress Notes (Signed)
ROBYN, GALATI ([id number]) [id number] Page 1 of 10 Visit Report for 10/04/2022 HPI Details Patient Name: Date of Service: Laura Ace. 10/04/2022 11:15 A M Medical Record Number: [id number] Patient Account Number: [id number] Date of Birth/Sex: Treating RN: [birth year] (76 y.o. [name] Primary Care Provider: [place] Other Clinician: [name] Referring Provider: Treating Provider/Extender: Laura Santiago, Laura Santiago in Treatment: 54 History of Present Illness HPI Description: Admission 7/27 Ms. Laura Santiago is a 76 year old female with a past medical history of ADHD, metastatic breast cancer, stage IV chronic kidney disease, history of DVT on Xarelto and chronic venous insufficiency that presents to the clinic for a chronic left lower extremity wound. She recently moved to [facility] 4 days ago. She was being followed by wound care center in [name]. She reports a 10-year history of wounds to her left lower extremity that eventually do heal with debridement and compression therapy. She states that the current wound reopened 4 months ago and she is using Vaseline and Coban. She denies signs of infection. 8/3; patient presents for 1 week follow-up. She reports no issues or complaints today. She states she had vascular studies done in the last week. She denies signs of infection. She brought her little service dog with her today. 8/17; patient presents for follow-up. She has missed her last clinic appointment. She states she took the wrap off and attempted to rewrap her leg. She is having difficulty with transportation. She has her service dog with her today. Overall she feels well and reports improvement in wound healing. She denies signs of infection. She reports owning an old Velcro wrap compression and has this at her living facility 9/14; patient presents for follow-up. Patient states that  over the past 2 to 3 weeks she developed toe wounds to her right foot. She attributes this to tight fitting shoes. She subsequently developed cellulitis in the right leg and has been treated by doxycycline by her oncologist. She reports improvement in symptoms however continues to have some redness and swelling to this leg. T the left lower extremity patient has been having her wraps changed with home health twice weekly. She states that the [facility] is not helping control o the drainage. Other than that she has no issues or complaints today. She denies signs of infection to the left lower extremity. 9/21; patient presents for follow-up. She reports seeing infectious disease for her cellulitis. She reports no further management. She has home health that changes the wraps twice weekly. She has no issues or complaints today. She denies signs of infection. 10/5; patient presents for follow-up. She has no issues or complaints today. She denies signs of infection. She states that the right great toe has not been dressed by home health. 10/12; patient presents for follow-up. She has no issues or complaints today. She reports improvement in her wound healing. She has been using silver alginate to the right great toe wound. She denies signs of infection. 10/26; patient presents for follow-up. Home health did not have sorbact so they continued to use Hydrofera Blue under the wrap. She has been using silver alginate to the great toe wound however she did not have a dressing in place today. She currently denies signs of infection. 11/2; patient presents for follow-up. She has been using sorb act under the compression wrap. She reports using silver alginate to the toe wound again she does not have a dressing in  place. She currently denies signs of infection. 11/23; patient presents for follow-up. Unfortunately she has missed her last 2 clinic appointments. She was last seen 3 weeks ago. She did her  own compression wrap with Kerlix and Coban yesterday after seeing vein and vascular. She has not been dressing her right great toe wound. She currently denies signs of infection. 11/30; patient presents for 1 week follow-up. She states she changed her dressing last week prior to home health and use sorb act with Dakin's and Hydrofera Blue. Home health has changed the dressing as well and they have been using sorbact. T oday she reports increased redness to her right lower extremity. She has a history of cellulitis to this leg. She has been using silver alginate to the right great toe. Unfortunately she had an episode of diarrhea prior to coming in and had feces all over the right leg and to the wrap of her left leg. 12/7; patient presents for 1 week follow-up. She states that home health did not come out to change the dressing and she took it off yesterday. It is unclear if she is dressing the right toe wound. She denies signs of infection. 12/14; patient presents for 1 week follow-up. She has no issues or complaints today. 12/21; patient presents for follow-up. She has no issues or complaints today. She denies signs of infection. 12/28/2021; patient presents for follow-up. She was hospitalized for sepsis secondary to right lower extremity cellulitis On 12/23. She states she is currently at a SNF. She states that she was started on doxycycline this morning for her right great toe swelling and redness. She is not sure what dressings have been done to her left lower extremity for the past 3 weeks. She says its been mainly gauze with an Ace wrap. 1/25; patient presents for follow-up. She is still residing in a skilled nursing facility. She reports mild pain to the left lower extremity wound bed. She states she is going to see a podiatrist soon. Laura Santiago, Laura Santiago ([id number]) [id number] Page 2 of 10 2/8; patient presents for follow-up. She has moved back to her residential  community from her skilled nursing facility. She has no issues or complaints today. She denies signs of systemic infections. 2/15; patient presents for follow-up. He has no issues or complaints today. She denies systemic signs of infection. 2/22; patient presents for follow-up. She has no issues or complaints today. She denies signs of infection. 3/1; patient presents for follow-up. She states that home health came out the day after she was seen in our clinic and yesterday to do the wrap change. She denies signs of infection. She reports excoriated skin on the ankle. 3/8; patient presents for follow-up. She has no issues or complaints today. She denies signs of infection. 3/15; patient presents for follow-up. Home health has been coming out to change the dressings. She reports more tenderness to the wound site. She denies purulent drainage, increased warmth or erythema to the area. 4/5; patient presents for follow-up. She has missed her last 2 clinic appointments. I have not seen her in 3 weeks. She was recently hospitalized for altered mental status. She was involuntarily committed. She was evaluated by psychiatry and deemed to have competency. There was no specific cause of her altered mental status. It was concluded that her physical and mental health were declining due to her chronic medical conditions. Currently home health has been coming out for dressing changes. Patient has also been doing her own dressing changes. She reports  more skin breakdown to the periwound and now has a new wound. She denies fever/chills. She reports continued tenderness to the wound site. 4/12; patient with significant venous insufficiency and a large wound on her left lower leg taking up about 80% of the circumference of her lower leg. Cultures of this grew MRSA and Pseudomonas. She had completed a course of ciprofloxacin now is starting doxycycline. She has been using Dakin's wet-to-dry and a Tubigrip. She has home  health twice a week and we change it once. 4/19; patient presents for follow-up. She completed her course of doxycycline. She has been using Dakin's wet-to-dry dressing and Tubigrip. Home health changes the dressing twice weekly. Currently she has no issues or complaints. 4/26; patient presents for follow-up. At last clinic visit orders for home health were Iodosorb under compression therapy. Unfortunately they did not have the dressing and have been using Dakin's and gentamicin under the wrap. Patient currently denies signs of infection. She has no issues or complaints today. 5/3; patient presents for follow-up. Again Iodosorb has not been used under the compression therapy when home health comes out to change the wrap and dressing. They have been using Sorbact. It is unclear why this is happening since we send orders weekly to the agency. She denies signs of infection. Patient has not purchased the [place] antibiotics. We reached out to the company and they said they have been trying to contact her on a regular basis. We gave the patient the number to call to order the medication. 5/10; patient presents for follow-up. She has no issues or complaints today. Again home health has not been using Iodosorb. Mepilex was on the wound bed. No other dressings noted. She brought in her Keystone antibiotics. She denies signs of infection. 5/17; patient presents for follow-up. Home health has come out twice since she was last seen. Joint well she has been using Keystone antibiotic with Sorbact under the compression wrap. She has no issues or complaints today. She denies signs of infection. 5/24; patient presents for follow-up. We have been using Keystone antibiotics with Sorbact under compression therapy. She is tolerating the treatment well. She is reporting improvement in wound healing. She denies signs of infection. 5/31; patient presents for follow-up. We continue to do [facility] antibiotics with Sorbact  under compression therapy. She continues to report improvement in wound healing. Home health comes out and changes the dressing once weekly. 05-17-2022 upon evaluation today patient appears to be doing better in regard to her wound especially compared to the last time I saw her. Fortunately I do think that she is seeing improvements. With that being said I do believe that she may be benefit from sharp debridement today to clear away some of the necrotic debris I discussed that with her as well. She is an amendable to that plan. Otherwise she is very pleased with how the [employer] is doing for her. 6/14; patient presents for follow-up. We have been using Keystone antibiotic with Sorbact and absorbent dressings under 3 layer compression. She has no issues or complaints today. She reports improvement in wound healing. She denies signs of infection. 6/21; patient presents for follow-up. We are continuing with [facility] antibiotic and Sorbact under 3 layer compression. Patient has no complaints. Continued wound healing is happening. She denies signs of infection. 6/28; patient presents for follow-up. We have been using Keystone antibiotic with Sorbact under 3 layer compression. Usually home health comes out and changes the dressing twice a week. Unfortunately they did not go  out to change the dressing. It is unclear why. Patient did not call them. She currently denies signs of infection. 7/5; patient presents for follow-up. We have been using Keystone antibiotic with calcium alginate under 3 layer compression. She reports improvement in wound healing. She denies signs of infection. Home health has come out to do dressing changes twice this past week. 7/12; patient presents for follow-up. We have been using Keystone antibiotic with calcium alginate under 3 layer compression. Patient states that home health came out once last week to change the dressing. She reports improvement in wound healing. She currently  denies signs of infection. 7/19; patient presents for follow-up. We have been using Keystone antibiotic with calcium alginate under 3 layer compression. Home health came out once last week to change the dressing. She has no issues or complaints today. She denies signs of infection. 8/2; patient presents for follow-up. We have been using Keystone antibiotic with calcium alginate under 3 layer compression. Unfortunately she missed her appointment last week and home health did not come out to do dressing changes. Patient currently denies signs of infection. 8/9; patient presents for follow-up. We have been using Keystone with calcium alginate under 3 layer compression. She states that home health came out once last week. She currently denies signs of infection. Her wrap was completely wet. She states she was cleaning the top of the leg and water soaked down into the wrap. 8/16; patient presents for follow-up. We have been using Keystone with calcium alginate under 3 layer compression. She states that home health came out twice last week. She has no issues or complaints today. 8/23; patient presents for follow-up. He has been using Keystone with calcium alginate under 3 layer compression. Home health came out twice last week. She denies signs of infection. 8/30; patient presents for follow-up. We have been using Keystone with calcium alginate under 3 layer compression. Home health came out once last week to change the dressing. Patient reports improvement in wound healing. She states she is almost done with her chemotherapy infusions and has 1 more left. 9/13; patient presents for follow-up. She has lost the capsules to her [facility] antibiotic which I believe is the vancomycin pills. She has her Zosyn powder today. We have been using Keystone antibiotic ointment with calcium alginate under 3 layer compression. She is concerned about systemic infection however her vitals are stable and there is no  surrounding soft tissue infection. She would like to remain a patient in our wound care center however would like a second opinion for her wound care at another facility. She asked to be referred to [facility] wound care center. Laura Santiago, Laura Santiago ([id number]) [id number] Page 3 of 10 9/20; patient presents for follow-up. She found her vancomycin capsules and brought in her complete Keystone antibiotic ointment set today. Unfortunately she has developed skin breakdown and Erythema to the right lower extremity With increased swelling. She states she went to a pow wow Over the weekend and was on her feet for extended periods of time. She saw her oncologist yesterday who prescribed her doxycycline for her right lower extremity erythema. 9/27; patient presents for follow-up. We have been using Keystone antibiotic with Aquacel under 3 layer compression to the lower extremities bilaterally. When home health came and changed the wrap she secretly put coffee into the spray mix along with [facility] antibiotic on her leg thinking the acidic component would better activate the zoysn (sonething she discussed with her microbiologist brother). She has reported improvement in  wound healing. 10/4; patient presents for follow-up. She has no issues or complaints today. We have been doing Aquacel and keystone under 3 layer compression to the lower extremities bilaterally. This morning she took the right lower extremity wrap off as it was uncomfortable. She has no open wounds to this leg. 10/11; patient presents for follow-up. We have been doing Aquacel with Keystone antibiotic ointment under 3 layer compression to the left lower extremity. She developed a small blister to the anterior aspect of the left leg noticed when the wrap was taken off on intake. She currently denies signs of infection. 10/18; patient presents for follow-up. We have been doing Aquacel with Keystone antibiotic ointment under 3  layer compression to the left lower extremity. There has been continued improvement in wound healing. She denies signs of infection. 10/25; patient arrives for treatment of venous insufficiency ulcers on her left lower leg both lateral and medial are remanence of apparently a circumferential wound. Much improved. We are using topicals Keystone and Aquacel Ag under 3 layer compression we continue to make good progress. The patient talk to me at some length with regards to different things she has on her forehead and her Peri orbital area for which she is apparently applying [place]. She feels that what ever we are treating on her wounds is a more systemic problem. I really was not able to get a handle on what she is talking about however I did caution her not to put the Tabiona in her eyes. Electronic Signature(s) Signed: 10/04/2022 4:38:50 PM By: Linton Ham MD Entered By: Linton Ham on 10/04/2022 11:46:09 -------------------------------------------------------------------------------- Physical Exam Details Patient Name: Date of Service: Laura Jewett [name] 10/04/2022 11:15 A M Medical Record Number: [id number] Patient Account Number: [id number] Date of Birth/Sex: Treating RN: [birth year] (76 y.o. [name] Primary Care Provider: [place] Other Clinician: [name] Referring Provider: Treating Provider/Extender: Laura Santiago, [place] in Treatment: 65 Constitutional Sitting or standing Blood Pressure is within target range for patient.. Pulse regular and within target range for patient.Marland Kitchen Respirations regular, non-labored and within target range.. Temperature is normal and within the target range for the patient.Marland Kitchen appears in no distress. Cardiovascular . We have decent edema control. Notes Pedal pulses are palpable. She has a wound on the lateral part of her ankle and the medial part that are remaining. Both of these appear clean  and are measuring smaller. There is no evidence of surrounding infection Electronic Signature(s) Signed: 10/04/2022 4:38:50 PM By: Linton Ham MD Entered By: Linton Ham on 10/04/2022 11:48:41 Laura Santiago ([id number]) [id number] Page 4 of 10 -------------------------------------------------------------------------------- Physician Orders Details Patient Name: Date of Service: Laura Ace. 10/04/2022 11:15 A M Medical Record Number: [id number] Patient Account Number: [id number] Date of Birth/Sex: Treating RN: February 05, 1946 (76 y.o. [name] Primary Care Provider: [place] Other Clinician: [name] Referring Provider: Treating Provider/Extender: Laura Santiago, [place] in Treatment: 33 Verbal / Phone Orders: No Diagnosis Coding Follow-up Appointments Return Appointment in 1 week. Nurse Visit as needed [place]: - [place] for wound care. May utilize formulary equivalent dressing for wound treatment orders unless otherwise specified. Home Health Nurse may visit PRN to address patients wound care needs. **Please direct any NON-WOUND related issues/requests for orders to patient's Primary Care Physician. **If current dressing causes regression in wound condition, may  D/C ordered dressing product/s and apply Normal Saline Moist Dressing daily until next [place] or Other MD appointment. **Notify Wound Healing Center of regression in wound condition at [phone]. Other Home Health Orders/Instructions: - Dressing change 3 x weekly, twice by home health and once at wound clinic weekly. PLEASE make sure frequency between dressing changes is appropriate. Bathing/ Shower/ Hygiene May shower with wound dressing protected with water repellent cover or cast protector. No tub bath. Anesthetic (Use 'Patient Medications' Section for Anesthetic  Order Entry) Lidocaine applied to wound bed Edema Control - Lymphedema / Segmental Compressive Device / Other Optional: One layer of unna paste to top of compression wrap (to act as an anchor). - PLEASE when applying wrap start from toes and go up to just below knee 3 Layer Compression System for Lymphedema. - left lower leg Tubigrip single layer applied. - Tubi D single right leg Elevate, Exercise Daily and A void Standing for Long Periods of Time. Elevate legs to the level of the heart and pump ankles as often as possible Elevate leg(s) parallel to the floor when sitting. DO YOUR BEST to sleep in the bed at night. DO NOT sleep in your recliner. Long hours of sitting in a recliner leads to swelling of the legs and/or potential wounds on your backside. Additional Orders / Instructions Follow Nutritious Diet and Increase Protein Intake Medications-Please add to medication list. Keystone Compound Wound Treatment Wound #1 - Lower Leg Wound Laterality: Left, Medial Cleanser: Wound Cleanser (Home Health) 3 x Per Week/30 Days Discharge Instructions: Wash your hands with soap and water. Remove old dressing, discard into plastic bag and place into trash. Cleanse the wound with Wound Cleanser prior to applying a clean dressing using gauze sponges, not tissues or cotton balls. Do not scrub or use excessive force. Pat dry using gauze sponges, not tissue or cotton balls. Peri-Wound Care: Nystatin Cream USP 30 (g) 3 x Per Week/30 Days Discharge Instructions: Use Nystatin Cream as directed. MIx 1:1 with TCA cream and applied to peri wound Peri-Wound Care: Triamcinolone Acetonide Cream, 0.1%, 15 (g) tube 3 x Per Week/30 Days Discharge Instructions: Apply as directed. mix 1:1 with nystatin cream for peri wound Prim Dressing: Aquacel Extra Hydrofiber Dressing, 4x5 (in/in) ary 3 x Per Week/30 Days Prim Dressing: keystone compound 3 x Per Week/30 Days ary Discharge Instructions: apply to wound bed as  directed on medication label PLEASE follow direction on pill bottle for proper mixing Laura Santiago, Laura Santiago ([id number]) [id number] Page 5 of 10 Secondary Dressing: Zetuvit Absorbent Pad, 4x8 (in/in) 3 x Per Week/30 Days Compression Wrap: 3-LAYER WRAP - Profore Lite LF 3 Multilayer Compression Bandaging System 3 x Per Week/30 Days Discharge Instructions: Apply 3 multi-layer wrap as prescribed. Electronic Signature(s) Signed: 10/04/2022 4:38:50 PM By: Linton Ham MD Signed: 10/05/2022 7:57:14 AM By: [name] Entered By: [name] on 10/04/2022 11:37:05 -------------------------------------------------------------------------------- Problem List Details Patient Name: Date of Service: Laura Jewett [name] 10/04/2022 11:15 A M Medical Record Number: [id number] Patient Account Number: [id number] Date of Birth/Sex: Treating RN: 1946/10/16 (76 y.o. [name] Primary Care Provider: [place] Other Clinician: [name] Referring Provider: Treating Provider/Extender: Laura Santiago, [place] in Treatment: 65 Active Problems ICD-10 Encounter Code Description Active Date MDM Diagnosis 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sed11/23/2022 No Yes 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 11/02/2021 No Yes  I87.311 Chronic venous hypertension (idiopathic) with ulcer of right lower extremity 08/30/2022 No Yes I87.2 Venous insufficiency (chronic) (peripheral) 07/06/2021 No Yes Z79.01 Long term (current) use of anticoagulants 07/06/2021 No Yes I10 Essential (primary) hypertension 07/06/2021 No Yes 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 07/06/2021 No Yes Inactive Problems ICD-10 Code Description Active Date Inactive Date S81.802A Unspecified open wound, left lower leg, initial encounter 07/06/2021 07/06/2021 Laura Santiago, Laura Santiago  ([id number]) [id number] Page 6 of 10 S91.101A Unspecified open wound of right great toe without damage to nail, initial encounter 08/24/2021 08/24/2021 S91.104A Unspecified open wound of right lesser toe(s) without damage to nail, initial encounter 08/24/2021 08/24/2021 Resolved Problems ICD-10 Code Description Active Date Resolved Date S91.104D Unspecified open wound of right lesser toe(s) without damage to nail, subsequent 08/31/2021 08/31/2021 encounter S91.201D Unspecified open wound of right great toe with damage to nail, subsequent encounter 08/31/2021 08/31/2021 Electronic Signature(s) Signed: 10/04/2022 4:38:50 PM By: Linton Ham MD Entered By: Linton Ham on 10/04/2022 11:44:02 -------------------------------------------------------------------------------- Progress Note Details Patient Name: Date of Service: Laura Jewett [name] 10/04/2022 11:15 A M Medical Record Number: [id number] Patient Account Number: [id number] Date of Birth/Sex: Treating RN: [birth year] (76 y.o. [name] Primary Care Provider: [place] Other Clinician: [name] Referring Provider: Treating Provider/Extender: Laura Santiago, [place] in Treatment: 65 Subjective History of Present Illness (HPI) Admission 7/27 Ms. Laura Santiago is a 76 year old female with a past medical history of ADHD, metastatic breast cancer, stage IV chronic kidney disease, history of DVT on Xarelto and chronic venous insufficiency that presents to the clinic for a chronic left lower extremity wound. She recently moved to [facility] 4 days ago. She was being followed by wound care center in [name]. She reports a 10-year history of wounds to her left lower extremity that eventually do heal with debridement and compression therapy. She states that the current wound reopened 4 months ago and she is using Vaseline and Coban. She denies signs of  infection. 8/3; patient presents for 1 week follow-up. She reports no issues or complaints today. She states she had vascular studies done in the last week. She denies signs of infection. She brought her little service dog with her today. 8/17; patient presents for follow-up. She has missed her last clinic appointment. She states she took the wrap off and attempted to rewrap her leg. She is having difficulty with transportation. She has her service dog with her today. Overall she feels well and reports improvement in wound healing. She denies signs of infection. She reports owning an old Velcro wrap compression and has this at her living facility 9/14; patient presents for follow-up. Patient states that over the past 2 to 3 weeks she developed toe wounds to her right foot. She attributes this to tight fitting shoes. She subsequently developed cellulitis in the right leg and has been treated by doxycycline by her oncologist. She reports improvement in symptoms however continues to have some redness and swelling to this leg. T the left lower extremity patient has been having her wraps changed with home health twice weekly. She states that the [facility] is not helping control o the drainage. Other than that she has no issues or complaints today. She denies signs of infection to the left lower extremity. 9/21; patient presents for follow-up. She reports seeing infectious disease for her cellulitis. She reports no further management. She has home health that changes the wraps twice weekly. She has  no issues or complaints today. She denies signs of infection. 10/5; patient presents for follow-up. She has no issues or complaints today. She denies signs of infection. She states that the right great toe has not been dressed by home health. 10/12; patient presents for follow-up. She has no issues or complaints today. She reports improvement in her wound healing. She has been using silver alginate to the  right great toe wound. She denies signs of infection. 10/26; patient presents for follow-up. Home health did not have sorbact so they continued to use Hydrofera Blue under the wrap. She has been using silver Laura Santiago, Laura Santiago ([id number]) [id number] Page 7 of 10 alginate to the great toe wound however she did not have a dressing in place today. She currently denies signs of infection. 11/2; patient presents for follow-up. She has been using sorb act under the compression wrap. She reports using silver alginate to the toe wound again she does not have a dressing in place. She currently denies signs of infection. 11/23; patient presents for follow-up. Unfortunately she has missed her last 2 clinic appointments. She was last seen 3 weeks ago. She did her own compression wrap with Kerlix and Coban yesterday after seeing vein and vascular. She has not been dressing her right great toe wound. She currently denies signs of infection. 11/30; patient presents for 1 week follow-up. She states she changed her dressing last week prior to home health and use sorb act with Dakin's and Hydrofera Blue. Home health has changed the dressing as well and they have been using sorbact. T oday she reports increased redness to her right lower extremity. She has a history of cellulitis to this leg. She has been using silver alginate to the right great toe. Unfortunately she had an episode of diarrhea prior to coming in and had feces all over the right leg and to the wrap of her left leg. 12/7; patient presents for 1 week follow-up. She states that home health did not come out to change the dressing and she took it off yesterday. It is unclear if she is dressing the right toe wound. She denies signs of infection. 12/14; patient presents for 1 week follow-up. She has no issues or complaints today. 12/21; patient presents for follow-up. She has no issues or complaints today. She denies signs of  infection. 12/28/2021; patient presents for follow-up. She was hospitalized for sepsis secondary to right lower extremity cellulitis On 12/23. She states she is currently at a SNF. She states that she was started on doxycycline this morning for her right great toe swelling and redness. She is not sure what dressings have been done to her left lower extremity for the past 3 weeks. She says its been mainly gauze with an Ace wrap. 1/25; patient presents for follow-up. She is still residing in a skilled nursing facility. She reports mild pain to the left lower extremity wound bed. She states she is going to see a podiatrist soon. 2/8; patient presents for follow-up. She has moved back to her residential community from her skilled nursing facility. She has no issues or complaints today. She denies signs of systemic infections. 2/15; patient presents for follow-up. He has no issues or complaints today. She denies systemic signs of infection. 2/22; patient presents for follow-up. She has no issues or complaints today. She denies signs of infection. 3/1; patient presents for follow-up. She states that home health came out the day after she was seen in our clinic and yesterday to do  the wrap change. She denies signs of infection. She reports excoriated skin on the ankle. 3/8; patient presents for follow-up. She has no issues or complaints today. She denies signs of infection. 3/15; patient presents for follow-up. Home health has been coming out to change the dressings. She reports more tenderness to the wound site. She denies purulent drainage, increased warmth or erythema to the area. 4/5; patient presents for follow-up. She has missed her last 2 clinic appointments. I have not seen her in 3 weeks. She was recently hospitalized for altered mental status. She was involuntarily committed. She was evaluated by psychiatry and deemed to have competency. There was no specific cause of her altered mental status. It  was concluded that her physical and mental health were declining due to her chronic medical conditions. Currently home health has been coming out for dressing changes. Patient has also been doing her own dressing changes. She reports more skin breakdown to the periwound and now has a new wound. She denies fever/chills. She reports continued tenderness to the wound site. 4/12; patient with significant venous insufficiency and a large wound on her left lower leg taking up about 80% of the circumference of her lower leg. Cultures of this grew MRSA and Pseudomonas. She had completed a course of ciprofloxacin now is starting doxycycline. She has been using Dakin's wet-to-dry and a Tubigrip. She has home health twice a week and we change it once. 4/19; patient presents for follow-up. She completed her course of doxycycline. She has been using Dakin's wet-to-dry dressing and Tubigrip. Home health changes the dressing twice weekly. Currently she has no issues or complaints. 4/26; patient presents for follow-up. At last clinic visit orders for home health were Iodosorb under compression therapy. Unfortunately they did not have the dressing and have been using Dakin's and gentamicin under the wrap. Patient currently denies signs of infection. She has no issues or complaints today. 5/3; patient presents for follow-up. Again Iodosorb has not been used under the compression therapy when home health comes out to change the wrap and dressing. They have been using Sorbact. It is unclear why this is happening since we send orders weekly to the agency. She denies signs of infection. Patient has not purchased the [place] antibiotics. We reached out to the company and they said they have been trying to contact her on a regular basis. We gave the patient the number to call to order the medication. 5/10; patient presents for follow-up. She has no issues or complaints today. Again home health has not been using Iodosorb.  Mepilex was on the wound bed. No other dressings noted. She brought in her Keystone antibiotics. She denies signs of infection. 5/17; patient presents for follow-up. Home health has come out twice since she was last seen. Joint well she has been using Keystone antibiotic with Sorbact under the compression wrap. She has no issues or complaints today. She denies signs of infection. 5/24; patient presents for follow-up. We have been using Keystone antibiotics with Sorbact under compression therapy. She is tolerating the treatment well. She is reporting improvement in wound healing. She denies signs of infection. 5/31; patient presents for follow-up. We continue to do [facility] antibiotics with Sorbact under compression therapy. She continues to report improvement in wound healing. Home health comes out and changes the dressing once weekly. 05-17-2022 upon evaluation today patient appears to be doing better in regard to her wound especially compared to the last time I saw her. Fortunately I do think that she  is seeing improvements. With that being said I do believe that she may be benefit from sharp debridement today to clear away some of the necrotic debris I discussed that with her as well. She is an amendable to that plan. Otherwise she is very pleased with how the [employer] is doing for her. 6/14; patient presents for follow-up. We have been using Keystone antibiotic with Sorbact and absorbent dressings under 3 layer compression. She has no issues or complaints today. She reports improvement in wound healing. She denies signs of infection. 6/21; patient presents for follow-up. We are continuing with [facility] antibiotic and Sorbact under 3 layer compression. Patient has no complaints. Continued wound healing is happening. She denies signs of infection. 6/28; patient presents for follow-up. We have been using Keystone antibiotic with Sorbact under 3 layer compression. Usually home health comes out  and changes the dressing twice a week. Unfortunately they did not go out to change the dressing. It is unclear why. Patient did not call them. She currently denies signs of infection. 7/5; patient presents for follow-up. We have been using Keystone antibiotic with calcium alginate under 3 layer compression. She reports improvement in wound healing. She denies signs of infection. Home health has come out to do dressing changes twice this past week. Laura Santiago, Laura Santiago ([id number]) [id number] Page 8 of 10 7/12; patient presents for follow-up. We have been using Keystone antibiotic with calcium alginate under 3 layer compression. Patient states that home health came out once last week to change the dressing. She reports improvement in wound healing. She currently denies signs of infection. 7/19; patient presents for follow-up. We have been using Keystone antibiotic with calcium alginate under 3 layer compression. Home health came out once last week to change the dressing. She has no issues or complaints today. She denies signs of infection. 8/2; patient presents for follow-up. We have been using Keystone antibiotic with calcium alginate under 3 layer compression. Unfortunately she missed her appointment last week and home health did not come out to do dressing changes. Patient currently denies signs of infection. 8/9; patient presents for follow-up. We have been using Keystone with calcium alginate under 3 layer compression. She states that home health came out once last week. She currently denies signs of infection. Her wrap was completely wet. She states she was cleaning the top of the leg and water soaked down into the wrap. 8/16; patient presents for follow-up. We have been using Keystone with calcium alginate under 3 layer compression. She states that home health came out twice last week. She has no issues or complaints today. 8/23; patient presents for follow-up. He has  been using Keystone with calcium alginate under 3 layer compression. Home health came out twice last week. She denies signs of infection. 8/30; patient presents for follow-up. We have been using Keystone with calcium alginate under 3 layer compression. Home health came out once last week to change the dressing. Patient reports improvement in wound healing. She states she is almost done with her chemotherapy infusions and has 1 more left. 9/13; patient presents for follow-up. She has lost the capsules to her [facility] antibiotic which I believe is the vancomycin pills. She has her Zosyn powder today. We have been using Keystone antibiotic ointment with calcium alginate under 3 layer compression. She is concerned about systemic infection however her vitals are stable and there is no surrounding soft tissue infection. She would like to remain a patient in our wound care center however would like a  second opinion for her wound care at another facility. She asked to be referred to [facility] wound care center. 9/20; patient presents for follow-up. She found her vancomycin capsules and brought in her complete Keystone antibiotic ointment set today. Unfortunately she has developed skin breakdown and Erythema to the right lower extremity With increased swelling. She states she went to a pow wow Over the weekend and was on her feet for extended periods of time. She saw her oncologist yesterday who prescribed her doxycycline for her right lower extremity erythema. 9/27; patient presents for follow-up. We have been using Keystone antibiotic with Aquacel under 3 layer compression to the lower extremities bilaterally. When home health came and changed the wrap she secretly put coffee into the spray mix along with [facility] antibiotic on her leg thinking the acidic component would better activate the zoysn (sonething she discussed with her microbiologist brother). She has reported improvement in wound healing. 10/4; patient  presents for follow-up. She has no issues or complaints today. We have been doing Aquacel and keystone under 3 layer compression to the lower extremities bilaterally. This morning she took the right lower extremity wrap off as it was uncomfortable. She has no open wounds to this leg. 10/11; patient presents for follow-up. We have been doing Aquacel with Keystone antibiotic ointment under 3 layer compression to the left lower extremity. She developed a small blister to the anterior aspect of the left leg noticed when the wrap was taken off on intake. She currently denies signs of infection. 10/18; patient presents for follow-up. We have been doing Aquacel with Keystone antibiotic ointment under 3 layer compression to the left lower extremity. There has been continued improvement in wound healing. She denies signs of infection. 10/25; patient arrives for treatment of venous insufficiency ulcers on her left lower leg both lateral and medial are remanence of apparently a circumferential wound. Much improved. We are using topicals Keystone and Aquacel Ag under 3 layer compression we continue to make good progress. The patient talk to me at some length with regards to different things she has on her forehead and her Peri orbital area for which she is apparently applying [place]. She feels that what ever we are treating on her wounds is a more systemic problem. I really was not able to get a handle on what she is talking about however I did caution her not to put the [place] in her eyes. Objective Constitutional Sitting or standing Blood Pressure is within target range for patient.. Pulse regular and within target range for patient.Marland Kitchen Respirations regular, non-labored and within target range.. Temperature is normal and within the target range for the patient.Marland Kitchen appears in no distress. Vitals Time Taken: 11:02 AM, Height: 66 in, Weight: 153 lbs, BMI: 24.7, Temperature: 98.4 F, Pulse: 71 bpm, Respiratory  Rate: 18 breaths/min, Blood Pressure: 143/79 mmHg. Cardiovascular We have decent edema control. General Notes: Pedal pulses are palpable. She has a wound on the lateral part of her ankle and the medial part that are remaining. Both of these appear clean and are measuring smaller. There is no evidence of surrounding infection Integumentary (Hair, Skin) Wound #1 status is Open. Original cause of wound was Gradually Appeared. The date acquired was: 04/06/2021. The wound has been in treatment 65 weeks. The wound is located on the Left,Medial Lower Leg. The wound measures 5cm length x 9.2cm width x 0.2cm depth; 36.128cm^2 area and 7.226cm^3 volume. There is Fat Layer (Subcutaneous Tissue) exposed. There is a medium amount of serosanguineous  drainage noted. The wound margin is flat and intact. There is medium (34-66%) red, pink granulation within the wound bed. There is a medium (34-66%) amount of necrotic tissue within the wound bed including [place]. Assessment Laura Santiago, Laura Santiago ([id number]) [id number] Page 9 of 10 A ctive Problems ICD-10 Non-pressure chronic ulcer of other part of left lower leg with fat layer exposed Chronic venous hypertension (idiopathic) with ulcer of left lower extremity Chronic venous hypertension (idiopathic) with ulcer of right lower extremity Venous insufficiency (chronic) (peripheral) Long term (current) use of anticoagulants Essential (primary) hypertension Secondary malignant neoplasm of breast Procedures Wound #1 Pre-procedure diagnosis of Wound #1 is a Venous Leg Ulcer located on the Left,Medial Lower Leg . There was a Three Layer Compression Therapy Procedure with a pre-treatment ABI of 1.3 by [name]. Post procedure Diagnosis Wound #1: Same as Pre-Procedure Plan Follow-up Appointments: Return Appointment in 1 week. Nurse Visit as needed Home Health: [place]: - [place] for wound  care. May utilize formulary equivalent dressing for wound treatment orders unless otherwise specified. Home Health Nurse may visit PRN to address patientoos wound care needs. **Please direct any NON-WOUND related issues/requests for orders to patient's Primary Care Physician. **If current dressing causes regression in wound condition, may D/C ordered dressing product/s and apply Normal Saline Moist Dressing daily until next [place] or Other MD appointment. **Notify Wound Healing Center of regression in wound condition at [phone]. Other Home Health Orders/Instructions: - Dressing change 3 x weekly, twice by home health and once at wound clinic weekly. PLEASE make sure frequency between dressing changes is appropriate. Bathing/ Shower/ Hygiene: May shower with wound dressing protected with water repellent cover or cast protector. No tub bath. Anesthetic (Use 'Patient Medications' Section for Anesthetic Order Entry): Lidocaine applied to wound bed Edema Control - Lymphedema / Segmental Compressive Device / Other: Optional: One layer of unna paste to top of compression wrap (to act as an anchor). - PLEASE when applying wrap start from toes and go up to just below knee 3 Layer Compression System for Lymphedema. - left lower leg Tubigrip single layer applied. - Tubi D single right leg Elevate, Exercise Daily and Avoid Standing for Long Periods of Time. Elevate legs to the level of the heart and pump ankles as often as possible Elevate leg(s) parallel to the floor when sitting. DO YOUR BEST to sleep in the bed at night. DO NOT sleep in your recliner. Long hours of sitting in a recliner leads to swelling of the legs and/or potential wounds on your backside. Additional Orders / Instructions: Follow Nutritious Diet and Increase Protein Intake Medications-Please add to medication list.: Keystone Compound WOUND #1: - Lower Leg Wound Laterality: Left, Medial Cleanser: Wound Cleanser  (Home Health) 3 x Per Week/30 Days Discharge Instructions: Wash your hands with soap and water. Remove old dressing, discard into plastic bag and place into trash. Cleanse the wound with Wound Cleanser prior to applying a clean dressing using gauze sponges, not tissues or cotton balls. Do not scrub or use excessive force. Pat dry using gauze sponges, not tissue or cotton balls. Peri-Wound Care: Nystatin Cream USP 30 (g) 3 x Per Week/30 Days Discharge Instructions: Use Nystatin Cream as directed. MIx 1:1 with TCA cream and applied to peri wound Peri-Wound Care: Triamcinolone Acetonide Cream, 0.1%, 15 (g) tube 3 x Per Week/30 Days Discharge Instructions: Apply as directed. mix 1:1 with nystatin cream for peri wound Prim Dressing: Aquacel Extra Hydrofiber Dressing, 4x5 (  in/in) 3 x Per Week/30 Days ary Prim Dressing: keystone compound 3 x Per Week/30 Days ary Discharge Instructions: apply to wound bed as directed on medication label PLEASE follow direction on pill bottle for proper mixing Secondary Dressing: Zetuvit Absorbent Pad, 4x8 (in/in) 3 x Per Week/30 Days Com pression Wrap: 3-LAYER WRAP - Profore Lite LF 3 Multilayer Compression Bandaging System 3 x Per Week/30 Days Discharge Instructions: Apply 3 multi-layer wrap as prescribed. 1. We continued with the Keystone, Aquacel Ag under 3 layer compression 2. She had a small wrap area on the left upper lateral part of her leg that we also put silver alginate on we will check this next week 3. I am uncertain about what the patient is talking about in terms of systemic issues. However I warned her that the [employer] should not be put in her eyes it is very clear she is putting this on lesions on her scalp I will try to have a look at her scalp next week Electronic Signature(s) Signed: 10/04/2022 4:38:50 PM By: Linton Ham MD Entered By: Linton Ham on 10/04/2022 11:49:46 Laura Santiago ([id number]) [id number]  Page 10 of 10 -------------------------------------------------------------------------------- SuperBill Details Patient Name: Date of Service: Laura Ace. 10/04/2022 Medical Record Number: [id number] Patient Account Number: [id number] Date of Birth/Sex: Treating RN: 10/18/1946 (76 y.o. [name] Primary Care Provider: [place] Other Clinician: [name] Referring Provider: Treating Provider/Extender: Laura Santiago, [place] in Treatment: 65 Diagnosis Coding ICD-10 Codes Code Description [phone] 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 I87.311 Chronic venous hypertension (idiopathic) with ulcer of right lower extremity I87.2 Venous insufficiency (chronic) (peripheral) Z79.01 Long term (current) use of anticoagulants I10 Essential (primary) hypertension 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 Facility Procedures : CPT4 Code: [id number] Description: (Facility Use Only) [phone] - [place] LWR LT LEG ICD-10 Diagnosis Description 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sed Modifier: Quantity: 1 Electronic Signature(s) Signed: 10/04/2022 4:38:50 PM By: Linton Ham MD Signed: 10/05/2022 7:57:14 AM By: [name] Entered By: [name] on 10/04/2022 11:37:34

## 2022-10-05 NOTE — Progress Notes (Signed)
DUAA, STELZNER ([id number]) [id number] Page 1 of 9 Visit Report for 10/04/2022 Arrival Information Details Patient Name: Date of Service: Laura Ace. 10/04/2022 11:15 A M Medical Record Number: [id number] Patient Account Number: [id number] Date of Birth/Sex: Treating RN: [birth year] (76 y.o. Laura Santiago Primary Care Laura Santiago: Laura Santiago Other Clinician: Massie Santiago Referring Laura Santiago: Treating Laura Santiago/Extender: RO BSO N, [place] in Treatment: 70 Visit Information History Since Last Visit All ordered tests and consults were completed: No Patient Arrived: [name] Added or deleted any medications: No Arrival Time: 10:56 Any new allergies or adverse reactions: No Transfer Assistance: None Had a fall or experienced change in No Patient Requires Transmission-Based No activities of daily living that may affect Precautions: risk of falls: Patient Has Alerts: Yes Hospitalized since last visit: No Patient Alerts: PT HAS SERVICE Pain Present Now: Yes ANIMAL ABI 07/11/21 R) 1.16 L) 1.27 Electronic Signature(s) Signed: 10/05/2022 7:57:14 AM By: Laura Santiago Entered By: Laura Santiago on 10/04/2022 11:01:34 -------------------------------------------------------------------------------- Clinic Level of Care Assessment Details Patient Name: Date of Service: Laura Ace. 10/04/2022 11:15 A M Medical Record Number: [id number] Patient Account Number: [id number] Date of Birth/Sex: Treating RN: 24-May-1946 (76 y.o. Laura Santiago Primary Care Karlee Staff: [place] Other Clinician: Massie Santiago Referring Kenyetta Wimbish: Treating Keishana Klinger/Extender: RO BSO N, [place] in Treatment: 27 Clinic Level of Care Assessment Items TOOL 1 Quantity Score _0  - 0 Use when EandM and Procedure is performed on INITIAL visit ASSESSMENTS - Nursing Assessment / Reassessment _1  -  0 General Physical Exam (combine w/ comprehensive assessment (listed just below) when performed on new pt. evals) _2  - 0 Comprehensive Assessment (HX, ROS, Risk Assessments, Wounds Hx, etc.) ASSESSMENTS - Wound and Skin Assessment / Reassessment _3  - 0 Dermatologic / Skin Assessment (not related to wound area) [name], [name] ([id number]) [id number] Page 2 of 9 ASSESSMENTS - Ostomy and/or Continence Assessment and Care _4  - 0 Incontinence Assessment and Management _5  - 0 Ostomy Care Assessment and Management (repouching, etc.) PROCESS - Coordination of Care _6  - 0 Simple Patient / Family Education for ongoing care _7  - 0 Complex (extensive) Patient / Family Education for ongoing care _8  - 0 Staff obtains Programmer, systems, Records, T Results / Process Orders est _9  - 0 Staff telephones HHA, Nursing Homes / Clarify orders / etc _10  - 0 Routine Transfer to another Facility (non-emergent condition) _11  - 0 Routine Hospital Admission (non-emergent condition) _12  - 0 New Admissions / Biomedical engineer / Ordering NPWT Apligraf, etc. , _13  - 0 Emergency Hospital Admission (emergent condition) PROCESS - Special Needs _14  - 0 Pediatric / Minor Patient Management _15  - 0 Isolation Patient Management _16  - 0 Hearing / Language / Visual special needs _17  - 0 Assessment of Community assistance (transportation, D/C planning, etc.) _18  - 0 Additional assistance / Altered mentation _19  - 0 Support Surface(s) Assessment (bed, cushion, seat, etc.) INTERVENTIONS - Miscellaneous _20  - 0 External ear exam _21  - 0 Patient Transfer (multiple staff / Civil Service fast streamer / Similar devices) _22  - 0 Simple Staple / Suture removal (25 or less) _23  - 0 Complex Staple / Suture removal (26 or more) _24  - 0 Hypo/Hyperglycemic Management (do not check if billed separately) _25  - 0 Ankle / Brachial Index (ABI) - do not check if billed separately Has the patient been seen at the hospital  within the last three years: Yes Total Score:  0 Level Of Care: ____ Electronic Signature(s) Signed: 10/05/2022 7:57:14 AM By: Laura Santiago Entered By: Laura Santiago on 10/04/2022 11:37:11 -------------------------------------------------------------------------------- Compression Therapy Details Patient Name: Date of Service: Laura Santiago [name] 10/04/2022 11:15 A M Medical Record Number: [id number] Patient Account Number: [id number] Date of Birth/Sex: Treating RN: [birth year] (76 y.o. Laura Santiago Primary Care Evey Mcmahan: [place] Other Clinician: Massie Santiago Referring Nichoel Digiulio: Treating Clee Pandit/Extender: RO BSO N, [place] in Treatment: 65 Compression Therapy Performed for Wound Assessment: Wound #1 Left,Medial Lower Leg Performed By: Laura Santiago, Laura Santiago, Compression Type: [address] Santiago, Laura Santiago ([id number]) [id number] Page 3 of 9 Pre Treatment ABI: 1.3 Post Procedure Diagnosis Same as Pre-procedure Electronic Signature(s) Signed: 10/05/2022 7:57:14 AM By: Laura Santiago Entered By: Laura Santiago on 10/04/2022 11:36:53 -------------------------------------------------------------------------------- Encounter Discharge Information Details Patient Name: Date of Service: Laura Santiago [name] 10/04/2022 11:15 A M Medical Record Number: [id number] Patient Account Number: [id number] Date of Birth/Sex: Treating RN: 01-12-1946 (76 y.o. Laura Santiago Primary Care Lakie Mclouth: [facility] Other Clinician: Massie Santiago Referring Devun Anna: Treating Chung Chagoya/Extender: RO BSO N, [place] in Treatment: 60 Encounter Discharge Information Items Discharge Condition: Stable Ambulatory Status: Walker Discharge Destination: Home Transportation: Other Accompanied By: self Schedule Follow-up Appointment: Yes Clinical Summary of Care: Electronic Signature(s) Signed:  10/05/2022 7:57:14 AM By: Laura Santiago Entered By: Laura Santiago on 10/04/2022 12:00:17 -------------------------------------------------------------------------------- Lower Extremity Assessment Details Patient Name: Date of Service: Laura Ace. 10/04/2022 11:15 A M Medical Record Number: [id number] Patient Account Number: [id number] Date of Birth/Sex: Treating RN: 01-12-1946 (76 y.o. Laura Santiago Primary Care Berish Bohman: [place] Other Clinician: Massie Santiago Referring Altair Stanko: Treating Rondey Fallen/Extender: RO BSO N, [place] in Treatment: 65 Edema Assessment Assessed: [Left: Yes] [Right: No] Edema: [Left: Ye] [Right: s] L[LeftELLEE, WAWRZYNIAK ([id number])] [Right: [id number] Page 4 of 9] Calf Left: Right: Point of Measurement: 30 cm From Medial Instep 33 cm Ankle Left: Right: Point of Measurement: 10 cm From Medial Instep 22.6 cm Vascular Assessment Pulses: Dorsalis Pedis Palpable: [Left:Yes] Posterior Tibial Palpable: [Left:Yes] Electronic Signature(s) Signed: 10/04/2022 5:50:20 PM By: Gretta Cool, BSN, RN, CWS, [name] RN, BSN Signed: 10/05/2022 7:57:14 AM By: Laura Santiago Entered By: Laura Santiago on 10/04/2022 11:15:09 -------------------------------------------------------------------------------- Multi Wound Chart Details Patient Name: Date of Service: Laura Santiago [name] 10/04/2022 11:15 A M Medical Record Number: [id number] Patient Account Number: [id number] Date of Birth/Sex: Treating RN: 1946-01-12 (76 y.o. Laura Santiago Primary Care Mattie Novosel: [place] Other Clinician: Massie Santiago Referring Cybele Maule: Treating Annalena Piatt/Extender: RO BSO N, [place] in Treatment: 65 Vital Signs Height(in): 66 Pulse(bpm): 71 Weight(lbs): 153 Blood Pressure(mmHg): 143/79 Body Mass Index(BMI): 24.7 Temperature(F): 98.4 Respiratory Rate(breaths/min):  18 [1:Photos:] [N/A:N/A] Left, Medial Lower Leg N/A N/A Wound Location: Gradually Appeared N/A N/A Wounding Event: KELITA, WALLIS ([id number]) [id number] Page 5 of 9 Venous Leg Ulcer N/A N/A Primary Etiology: Hypertension, Osteoarthritis, ReceivedN/A N/A Comorbid History: Chemotherapy, Received Radiation 04/06/2021 N/A N/A Date Acquired: 57 N/A N/A Weeks of Treatment: Open N/A N/A Wound Status: No N/A N/A Wound Recurrence: Yes N/A N/A Clustered Wound: 5x9.2x0.2 N/A N/A Measurements L x W x D (cm) 36.128 N/A N/A A (cm) : rea 7.226 N/A N/A Volume (cm) : 60.00% N/A N/A % Reduction in Area: 60.00% N/A N/A % Reduction in Volume: Full  Thickness Without Exposed N/A N/A Classification: Support Structures Medium N/A N/A Exudate Amount: Serosanguineous N/A N/A Exudate Type: red, brown N/A N/A Exudate Color: Flat and Intact N/A N/A Wound Margin: Medium (34-66%) N/A N/A Granulation Amount: Red, Pink N/A N/A Granulation Quality: Medium (34-66%) N/A N/A Necrotic Amount: Fat Layer (Subcutaneous Tissue): Yes N/A N/A Exposed Structures: Fascia: No Tendon: No Muscle: No Joint: No Bone: No Small (1-33%) N/A N/A Epithelialization: Treatment Notes Electronic Signature(s) Signed: 10/05/2022 7:57:14 AM By: Laura Santiago Entered By: Laura Santiago on 10/04/2022 11:15:22 -------------------------------------------------------------------------------- Multi-Disciplinary Care Plan Details Patient Name: Date of Service: Laura Santiago [name] 10/04/2022 11:15 A M Medical Record Number: [id number] Patient Account Number: [id number] Date of Birth/Sex: Treating RN: Feb 19, 1946 (76 y.o. Laura Santiago Primary Care Venora Kautzman: [employer] Other Clinician: Massie Santiago Referring Camya Haydon: Treating Kahari Critzer/Extender: RO BSO N, [place] in Treatment: 9 Active Inactive Soft Tissue Infection Nursing  Diagnoses: Impaired tissue integrity Potential for infection: soft tissue Goals: Patient's soft tissue infection will resolve Date Initiated: 03/15/2022 Target Resolution Date: 04/27/2022 Goal Status: Active Signs and symptoms of infection will be recognized early to allow for prompt treatment Date Initiated: 03/15/2022 Date Inactivated: 04/26/2022 Target Resolution Date: 04/27/2022 Goal Status: Met Interventions: KYLEIGH, NANNINI ([id number]) [id number] Page 6 of 9 Assess signs and symptoms of infection every visit Treatment Activities: Culture and sensitivity : 03/15/2022 Notes: Wound/Skin Impairment Nursing Diagnoses: Impaired tissue integrity Goals: Patient/caregiver will verbalize understanding of skin care regimen Date Initiated: 07/06/2021 Date Inactivated: 07/27/2021 Target Resolution Date: 07/06/2021 Goal Status: Met Ulcer/skin breakdown will have a volume reduction of 30% by week 4 Date Initiated: 07/06/2021 Date Inactivated: 10/12/2021 Target Resolution Date: 08/06/2021 Goal Status: Unmet Unmet Reason: cont tx Ulcer/skin breakdown will have a volume reduction of 50% by week 8 Date Initiated: 07/06/2021 Target Resolution Date: 09/06/2021 Goal Status: Active Ulcer/skin breakdown will have a volume reduction of 80% by week 12 Date Initiated: 07/06/2021 Target Resolution Date: 10/06/2021 Goal Status: Active Ulcer/skin breakdown will heal within 14 weeks Date Initiated: 07/06/2021 Target Resolution Date: 11/06/2021 Goal Status: Active Interventions: Assess patient/caregiver ability to obtain necessary supplies Assess patient/caregiver ability to perform ulcer/skin care regimen upon admission and as needed Assess ulceration(s) every visit Treatment Activities: Referred to DME Jaedynn Bohlken for dressing supplies : 07/06/2021 Skin care regimen initiated : 07/06/2021 Notes: Electronic Signature(s) Signed: 10/04/2022 5:50:20 PM By: Gretta Cool, BSN, RN, CWS, [name] RN,  BSN Signed: 10/05/2022 7:57:14 AM By: Laura Santiago Entered By: Laura Santiago on 10/04/2022 11:15:15 -------------------------------------------------------------------------------- Pain Assessment Details Patient Name: Date of Service: Laura Santiago [name] 10/04/2022 11:15 A M Medical Record Number: [id number] Patient Account Number: [id number] Date of Birth/Sex: Treating RN: 10-13-1946 (76 y.o. Laura Santiago Primary Care Juwana Thoreson: [place] Other Clinician: Massie Santiago Referring Tyauna Lacaze: Treating Delayza Lungren/Extender: RO BSO N, [place] in Treatment: 8 Active Problems Location of Pain Severity and Description of Pain Patient Has Paino Yes Site Locations Pain LocationKENZLY, ROGOFF ([id number]) [id number] Page 7 of 9 Pain Location: Generalized Pain, Pain in Ulcers Duration of the Pain. Constant / Intermittento Constant Rate the pain. Current Pain Level: 4 Character of Pain Describe the Pain: Burning, Throbbing Pain Management and Medication Current Pain Management: Medication: Yes Rest: Yes Electronic Signature(s) Signed: 10/04/2022 5:50:20 PM By: Gretta Cool, BSN, RN, CWS, [name] RN, BSN Signed: 10/05/2022 7:57:14 AM By: Laura Santiago Entered By: Laura Santiago on 10/04/2022 11:05:23 -------------------------------------------------------------------------------- Patient/Caregiver Education  Details Patient Name: Date of Service: Laura Ace. 10/25/2023andnbsp11:15 A M Medical Record Number: [id number] Patient Account Number: [id number] Date of Birth/Gender: Treating RN: 07/31/1946 (76 y.o. Laura Santiago Primary Care Physician: [place] Other Clinician: Massie Santiago Referring Physician: Treating Physician/Extender: RO BSO N, [place] Weeks in Treatment: 109 Education Assessment Education Provided To: Patient Education Topics Provided Wound/Skin  Impairment: Handouts: Other: continue wound care as directed Methods: Explain/Verbal Responses: State content correctly Electronic Signature(s) Signed: 10/05/2022 7:57:14 AM By: Laura Santiago Entered By: Laura Santiago on 10/04/2022 11:37:55 [name] ([id number]) [id number] Page 8 of 9 -------------------------------------------------------------------------------- Wound Assessment Details Patient Name: Date of Service: Laura Ace. 10/04/2022 11:15 A M Medical Record Number: [id number] Patient Account Number: [id number] Date of Birth/Sex: Treating RN: 11/06/1946 (75 y.o. Laura Santiago Primary Care Buelah Rennie: [place] Other Clinician: Massie Santiago Referring [place] Flight: Treating Adaliz Dobis/Extender: RO BSO N, [place] in Treatment: 65 Wound Status Wound Number: 1 Primary Venous Leg Ulcer Etiology: Wound Location: Left, Medial Lower Leg Wound Status: Open Wounding Event: Gradually Appeared Comorbid Hypertension, Osteoarthritis, Received Chemotherapy, Date Acquired: 04/06/2021 History: Received Radiation Weeks Of Treatment: 65 Clustered Wound: Yes Photos Wound Measurements Length: (cm) 5 Width: (cm) 9.2 Depth: (cm) 0.2 Area: (cm) 36.128 Volume: (cm) 7.226 % Reduction in Area: 60% % Reduction in Volume: 60% Epithelialization: Small (1-33%) Wound Description Classification: Full Thickness Without Exposed Support Structures Wound Margin: Flat and Intact Exudate Amount: Medium Exudate Type: Serosanguineous Exudate Color: red, brown Foul Odor After Cleansing: No Slough/Fibrino Yes Wound Bed Granulation Amount: Medium (34-66%) Exposed Structure Granulation Quality: Red, Pink Fascia Exposed: No Necrotic Amount: Medium (34-66%) Fat Layer (Subcutaneous Tissue) Exposed: Yes Necrotic Quality: Adherent Slough Tendon Exposed: No Muscle Exposed: No Joint Exposed: No Bone Exposed:  No Treatment Notes Wound #1 (Lower Leg) Wound Laterality: Left, Medial Cleanser Wound Cleanser Discharge Instruction: Wash your hands with soap and water. Remove old dressing, discard into plastic bag and place into trash. Cleanse the wound with Wound Cleanser prior to applying a clean dressing using gauze sponges, not tissues or cotton balls. Do not scrub or use excessive MERIEM, LEMIEUX ([id number]) [id number] Page 9 of 9 force. Pat dry using gauze sponges, not tissue or cotton balls. Peri-Wound Care Nystatin Cream USP 30 (g) Discharge Instruction: Use Nystatin Cream as directed. MIx 1:1 with TCA cream and applied to peri wound Triamcinolone Acetonide Cream, 0.1%, 15 (g) tube Discharge Instruction: Apply as directed. mix 1:1 with nystatin cream for peri wound Topical Primary Dressing Aquacel Extra Hydrofiber Dressing, 4x5 (in/in) keystone compound Discharge Instruction: apply to wound bed as directed on medication label PLEASE follow direction on pill bottle for proper mixing Secondary Dressing Zetuvit Absorbent Pad, 4x8 (in/in) Secured With Compression Wrap 3-LAYER WRAP - Profore Lite LF 3 Multilayer Compression Bandaging System Discharge Instruction: Apply 3 multi-layer wrap as prescribed. Compression Stockings Environmental education officer) Signed: 10/04/2022 5:50:20 PM By: Gretta Cool, BSN, RN, CWS, [name] RN, BSN Signed: 10/05/2022 7:57:14 AM By: Laura Santiago Entered By: Laura Santiago on 10/04/2022 11:14:04 -------------------------------------------------------------------------------- Vitals Details Patient Name: Date of Service: Laura Santiago [name] 10/04/2022 11:15 A M Medical Record Number: [id number] Patient Account Number: [id number] Date of Birth/Sex: Treating RN: 11-17-1946 (76 y.o. Laura Santiago Primary Care Jazlynne Milliner: [facility] Other Clinician: Massie Santiago Referring Jahel Wavra: Treating Nahom Carfagno/Extender: RO BSO N, [place]  Weeks in Treatment: 65 Vital Signs Time Taken: 11:02 Temperature (F): 98.4 Height (in): 66 Pulse (bpm): 71 Weight (lbs): 153 Respiratory Rate (breaths/min): 18 Body Mass Index (BMI): 24.7 Blood Pressure (mmHg): 143/79 Reference Range: 80 - 120 mg / dl Electronic Signature(s) Signed: 10/05/2022 7:57:14 AM By: Laura Santiago Entered By: Laura Santiago on 10/04/2022 11:05:19

## 2022-10-09 ENCOUNTER — Encounter (INDEPENDENT_AMBULATORY_CARE_PROVIDER_SITE_OTHER): Payer: Self-pay

## 2022-10-10 ENCOUNTER — Inpatient Hospital Stay: Payer: Medicare Other

## 2022-10-10 ENCOUNTER — Telehealth: Payer: Self-pay

## 2022-10-10 ENCOUNTER — Inpatient Hospital Stay (HOSPITAL_BASED_OUTPATIENT_CLINIC_OR_DEPARTMENT_OTHER): Payer: Medicare Other | Admitting: Oncology

## 2022-10-10 ENCOUNTER — Encounter: Payer: Self-pay | Admitting: Oncology

## 2022-10-10 ENCOUNTER — Other Ambulatory Visit: Payer: Self-pay

## 2022-10-10 VITALS — BP 126/63 | HR 79 | Resp 16 | Wt 143.6 lb

## 2022-10-10 DIAGNOSIS — M25561 Pain in right knee: Secondary | ICD-10-CM

## 2022-10-10 DIAGNOSIS — C50919 Malignant neoplasm of unspecified site of unspecified female breast: Secondary | ICD-10-CM

## 2022-10-10 DIAGNOSIS — Z7189 Other specified counseling: Secondary | ICD-10-CM

## 2022-10-10 DIAGNOSIS — Z5112 Encounter for antineoplastic immunotherapy: Secondary | ICD-10-CM

## 2022-10-10 DIAGNOSIS — Z5111 Encounter for antineoplastic chemotherapy: Secondary | ICD-10-CM | POA: Diagnosis not present

## 2022-10-10 DIAGNOSIS — R601 Generalized edema: Secondary | ICD-10-CM

## 2022-10-10 DIAGNOSIS — C7951 Secondary malignant neoplasm of bone: Secondary | ICD-10-CM

## 2022-10-10 MED ORDER — OXYCODONE HCL 10 MG PO TABS: 15.0000 mg | ORAL_TABLET | Freq: Four times a day (QID) | ORAL | 0 refills | Status: DC | PRN

## 2022-10-10 MED ORDER — DIPHENHYDRAMINE HCL 25 MG PO CAPS
50.0000 mg | ORAL_CAPSULE | Freq: Once | ORAL | Status: AC
  Administered 2022-10-10: 50 mg via ORAL
  Filled 2022-10-10: qty 2

## 2022-10-10 MED ORDER — TRASTUZUMAB-DKST CHEMO 150 MG IV SOLR
6.0000 mg/kg | Freq: Once | INTRAVENOUS | Status: AC
  Administered 2022-10-10: 378 mg via INTRAVENOUS
  Filled 2022-10-10: qty 18

## 2022-10-10 MED ORDER — ACETAMINOPHEN 325 MG PO TABS
650.0000 mg | ORAL_TABLET | Freq: Once | ORAL | Status: AC
  Administered 2022-10-10: 650 mg via ORAL
  Filled 2022-10-10: qty 2

## 2022-10-10 MED ORDER — FENTANYL 25 MCG/HR TD PT72: MEDICATED_PATCH | TRANSDERMAL | 0 refills | Status: DC

## 2022-10-10 MED ORDER — SODIUM CHLORIDE 0.9 % IV SOLN
420.0000 mg | Freq: Once | INTRAVENOUS | Status: AC
  Administered 2022-10-10: 420 mg via INTRAVENOUS
  Filled 2022-10-10: qty 14

## 2022-10-10 MED ORDER — LISINOPRIL 20 MG PO TABS: 20.0000 mg | ORAL_TABLET | Freq: Every day | ORAL | 3 refills | Status: AC

## 2022-10-10 MED ORDER — SODIUM CHLORIDE 0.9 % IV SOLN
Freq: Once | INTRAVENOUS | Status: AC
  Filled 2022-10-10: qty 250

## 2022-10-10 MED ORDER — HEPARIN SOD (PORK) LOCK FLUSH 100 UNIT/ML IV SOLN
500.0000 [IU] | Freq: Once | INTRAVENOUS | Status: AC | PRN
  Administered 2022-10-10: 500 [IU]
  Filled 2022-10-10: qty 5

## 2022-10-10 NOTE — Telephone Encounter (Signed)
Reached out to [place] regarding pts NP appt in March 2024 and per Dr. Janese Banks is it possible to have appt moved up. Reached the office VM; provided the needed details and asked for a return call. If no call is returned today; I'll try again tomorrow.

## 2022-10-10 NOTE — Progress Notes (Signed)
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  She had a screening mammogram in September 2014 which showed 2.1 x 2.3 x 1.8 cm irregular mass in her right breast.  It was ER 10% positive PR negative and HER2 positive +3.  She received neoadjuvant chemotherapy with Taxol Herceptin and Perjeta for 4 cycles followed by dose dense AC/Herceptin x4 which she completed in March 2015.  She had a right lumpectomy on 04/03/2014 which showed scant residual invasive ductal carcinoma [id number].  She completed 1 year of adjuvant Herceptin chemotherapy and also completed adjuvant radiation treatment.  She was recommended anastrozole which she took on and off starting November 2015 and stopped sometime in 2020.   She was then hospitalized with neck pain and was found to have lytic lesions involving C5-C6 with pathological vertebral fractures.  She underwent radiation treatment to this area.  Image guided biopsy of the L1 vertebral body showed metastatic carcinoma consistent with breast origin ER 10% PR 0% and HER2 amplified ratio 5.1 average HER2 signal number per cell 15.0 average CEP 17 signals number per cell 3.0.  Baseline echocardiogram on 05/02/2021 showed a normal EF of 62% she was recommended Taxol  Herceptin and Perjeta which she received for 2 cycles at [facility] until June 10, 2021   She has chronic pain from her bone metastases for which she is currently on oxycodone 10 mg every 4 hours as needed and 12 mcg fentanyl patch.  She was seeing pain clinic when she was living in [name].   Patient has also been on Zometa when she was in [name] but she does have some ongoing dental issues.  She has received Xgeva in the past as well.  Her last Delton See was in July 2021.  Last PET scan was on 03/21/2021 which showed diffuse osseous metastatic disease involving the head neck chest abdomen and pelvis and spine.  Left lung apex hypermetabolic nodule and multiple hypermetabolic liver lesions concerning for disease involvement.  Enlarged hypermetabolic left inguinal lymph nodes along with hypermetabolic external iliac and left supraclavicular lymph nodes   Patient is now moved to [place] to be close to her daughter.  She lives in an independent living.  Patient received Taxol Herceptin and Perjeta for about a year and is presently on maintenance Herceptin and Perjeta    Interval history-patient continues to feel that there is some kind of a crawling sensation over her scalp as well as throughout her body.  She feels that her scalp does not feel right and has a spongy texture to it.  She has small areas of erythematous rash over her forehead which has been bothering her.  She has baseline osteoarthritis in her right knee which has been bothering her.  Patient has chronic left lower extremity wound for which she has been using topical Zosyn.  She would like to  use the same for her forehead lesions as well.  ECOG PS- 2 Pain scale- 4   Review of systems- Review of Systems  Constitutional:  Negative for chills, fever, malaise/fatigue and weight loss.  HENT:  Negative for congestion, ear discharge and nosebleeds.   Eyes:  Negative for blurred vision.  Respiratory:  Negative for cough, hemoptysis, sputum production,  shortness of breath and wheezing.   Cardiovascular:  Negative for chest pain, palpitations, orthopnea and claudication.  Gastrointestinal:  Negative for abdominal pain, blood in stool, constipation, diarrhea, heartburn, melena, nausea and vomiting.  Genitourinary:  Negative for dysuria, flank pain, frequency, hematuria and urgency.  Musculoskeletal:  Positive for joint pain. Negative for back pain and myalgias.  Skin:  Positive for rash.  Neurological:  Negative for dizziness, tingling, focal weakness, seizures, weakness and headaches.  Endo/Heme/Allergies:  Does not bruise/bleed easily.  Psychiatric/Behavioral:  Negative for depression and suicidal ideas. The patient does not have insomnia.       Allergies  Allergen Reactions   Other Diarrhea and Nausea And Vomiting    Pt has had episodes of nausea, vomiting and diarrhea right after receiving IV technetium for NM study on two occasions.   Technetium Tc 74mMedronate Diarrhea and Nausea And Vomiting    Pt has had episodes of nausea, vomiting and diarrhea right after receiving IV technetium for NM study on two occasions.    Corticosteroids Other (See Comments)    [place]. Notes that it causes agitation intolerance   Sulfa Antibiotics Other (See Comments)    [place] has allergy but we do not know reactions when taking the drug   Celebrex [Celecoxib] Rash     Past Medical History:  Diagnosis Date   ADHD (attention deficit hyperactivity disorder)    [place]   Anemia    IDA 11/26/2019, Anemia in stage 4 chronic kidney disease ([place] 09/03/2020   Arthritis    osteoarthritis right knee 09/30/2014   Breast cancer ([place]05[place]   [place] 2 [place] 3[place]   Cancer related pain 03/28/2021   [place]   cervical compression fracture 03[place]18/2021   [place]   DVT of lower extremity, bilateral ([place] 03/30/2014   [place]   Generalized muscle weakness 03/31/2016   [place]  GERD (gastroesophageal reflux disease) 05/15/2013   [place]  [place]parathyroidism, secondary ([place] 04/25/2018   [place]  Hypertension 02/20/2016   [place]   [place]  [place] 003/21/2248  [place]  Metabolic syndrome 025/00/3704  [place]  Osteopenia after menopause 03/29/2016   [place]  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   [place]  Stasis ulcer of left lower extremity ([place] 03/29/2016   [place]    Past Surgical History:  Procedure Laterality Date   CESAREAN SECTION     unknown   fibroid removed  N/A    [place]   IR FLUORO GUIDE CV LINE LEFT  07/27/2021   IR PORT REPAIR CENTRAL VENOUS ACCESS DEVICE Left    [place]      unknown   PORTA  CATH INSERTION N/A 02/13/2022   Procedure: PORTA CATH INSERTION;  Surgeon: Algernon Huxley, MD;  Location: [facility];  Service: Cardiovascular;  Laterality: N/A;   [place] CATARACT EXTRACAP,INSERT LENS Bilateral  Bilateral 09/05/2012   in [place]     unknown    LUMPECTOMY Right 04/03/2014   in [place]    Social History   Socioeconomic History   Marital status: Divorced    Spouse name: Not on file   Number of children: Not on file   Years of education: Not on file   Highest education level: Not on file  Occupational History   Occupation: retired Teacher, music    Comment: In [place]  Tobacco Use   Smoking status: Never   Smokeless tobacco: Never  Vaping Use   Vaping Use: Never used  Substance and Sexual Activity   Alcohol use: Not Currently   Drug use: Not Currently   Sexual activity: Not Currently  Other Topics Concern   Not on file  Social History Narrative   Not on file   Social Determinants of Health   Financial Resource Strain: Low Risk  (03/17/2022)   Overall Financial Resource Strain (CARDIA)     Difficulty of Paying Living Expenses: Not hard at all  Food Insecurity: No Food Insecurity (03/17/2022)   Hunger Vital Sign    Worried About Running Out of Food in the Last Year: Never true    Ran Out of Food in the Last Year: Never true  Transportation Needs: No Transportation Needs (03/17/2022)   PRAPARE - Hydrologist (Medical): No    Lack of Transportation (Non-Medical): No  Physical Activity: Inactive (03/17/2022)   Exercise Vital Sign    Days of Exercise per Week: 0 days    Minutes of Exercise per Session: 0 min  Stress: No Stress Concern Present (03/17/2022)   [place]    Feeling of Stress : Only a little  Social Connections: Socially Isolated (03/17/2022)   Social Connection and Isolation Panel [NHANES]    Frequency of Communication with Friends and Family: Twice a week    Frequency of Social Gatherings with Friends and Family: Twice a week    Attends Religious Services: Never    Marine scientist or Organizations: No    Attends Archivist Meetings: Never    Marital Status: Widowed  Intimate Partner Violence: Not At Risk (03/17/2022)   Humiliation, Afraid, Rape, and Kick questionnaire    Fear of Current or Ex-Partner: No    Emotionally Abused: No    Physically Abused: No    Sexually Abused: No    Family History  Problem Relation Age of Onset   Pancreatic cancer Mother    Stroke Father    Diabetes Father    Hypertension Father    Heart disease Father    Skin cancer Father    Varicose Veins Father    Skin cancer Brother    Cancer - Prostate Brother      Current Outpatient Medications:    acetaminophen (TYLENOL) 500 MG tablet, Take 500 mg by mouth every 4 (four) hours as needed., Disp: , Rfl:    amphetamine-dextroamphetamine (ADDERALL XR) 30 MG 24 hr capsule, Take 1 capsule (30 mg total) by mouth daily., Disp: 30 capsule, Rfl: 0   gabapentin (NEURONTIN) 100 MG  capsule, TAKE 1 CAPSULE BY MOUTH 3 TIMES DAILY, Disp: 90 capsule, Rfl:  1   LORazepam (ATIVAN) 0.5 MG tablet, Take 1 tablet (0.5 mg total) by mouth daily as needed for anxiety., Disp: 30 tablet, Rfl: 0   Multiple Vitamin (MULTI-VITAMIN) tablet, Take 1 tablet by mouth daily., Disp: , Rfl:    NON FORMULARY, Apply 1 Dose topically daily. Pt mixes up piperacillim and tazobactam and puts it on the wounds of left leg, Disp: , Rfl:    rivaroxaban (XARELTO) 20 MG TABS tablet, Take 1 tablet (20 mg total) by mouth daily with supper., Disp: 30 tablet, Rfl: 3   sodium hypochlorite (DAKIN'S 1/4 STRENGTH) 0.125 % SOLN, Apply 1 application topically as directed. Moisten gauze with solution and wrap wound, Disp: , Rfl:    calcium citrate (CALCITRATE - DOSED IN MG ELEMENTAL CALCIUM) 950 (200 Ca) MG tablet, Take 200 mg of elemental calcium by mouth daily. (Patient not taking: Reported on 10/10/2022), Disp: , Rfl:    doxycycline (VIBRA-TABS) 100 MG tablet, Take 1 tablet (100 mg total) by mouth 2 (two) times daily., Disp: 14 tablet, Rfl: 0   DULoxetine (CYMBALTA) 30 MG capsule, Take by mouth. (Patient not taking: Reported on 06/06/2022), Disp: , Rfl:    fentaNYL (DURAGESIC) 25 MCG/HR, PLACE 1 PATCH ONTO THE SKIN EVERY THREE DAYS AS DIRECTED., Disp: 10 patch, Rfl: 0   gentamicin cream (GARAMYCIN) 0.1 %, SMARTSIG:1 Application Topical (Patient not taking: Reported on 03/14/2022), Disp: , Rfl:    hydrOXYzine (ATARAX) 25 MG tablet, Take by mouth. (Patient not taking: Reported on 08/15/2022), Disp: , Rfl:    lisinopril (ZESTRIL) 20 MG tablet, Take 1 tablet (20 mg total) by mouth daily., Disp: 30 tablet, Rfl: 3   naloxone (NARCAN) nasal spray 4 mg/0.1 mL, SPRAY 1 SPRAY INTO ONE NOSTRIL AS DIRECTED FOR OPIOID OVERDOSE (TURN PERSON ON SIDE AFTER DOSE. IF NO RESPONSE IN 2-3 MINUTES OR PERSON RESPONDS BUT RELAPSES, REPEAT USING A NEW SPRAY DEVICE AND SPRAY INTO THE OTHER NOSTRIL. CALL 911 AFTER USE.) * EMERGENCY USE ONLY * (Patient not  taking: Reported on 08/29/2022), Disp: 1 each, Rfl: 0   ondansetron (ZOFRAN) 8 MG tablet, Take 1 tablet (8 mg total) by mouth as needed for nausea or vomiting. (Patient not taking: Reported on 08/29/2022), Disp: 30 tablet, Rfl: 0   Oxycodone HCl 10 MG TABS, Take 1.5 tablets (15 mg total) by mouth every 6 (six) hours as needed., Disp: 90 tablet, Rfl: 0 No current facility-administered medications for this visit.  Facility-Administered Medications Ordered in Other Visits:    heparin lock flush 100 UNIT/ML injection, , , ,   Physical exam:  Vitals:   10/10/22 1021  BP: 126/63  Pulse: 79  Resp: 16  Weight: 143 lb 9.6 oz (65.1 kg)   Physical Exam Constitutional:      Comments: Ambulates with a walker.  Appears in no acute distress  Cardiovascular:     Rate and Rhythm: Normal rate and regular rhythm.     Heart sounds: Normal heart sounds.  Pulmonary:     Effort: Pulmonary effort is normal.     Breath sounds: Normal breath sounds.  Abdominal:     General: Bowel sounds are normal.     Palpations: Abdomen is soft.  Musculoskeletal:     Comments: Dressing in place over left lower extremity.  There is mild erythema noted at the base of her right leg.  Otherwise no overt cellulitis noted over the right lower extremity.  Right knee does not appear swollen or erythematous.  Skin:    General:  Skin is warm and dry.     Comments: Mild scattered excoriating papules noted over forehead and bilateral hands  Neurological:     Mental Status: She is alert and oriented to person, place, and time.         Latest Ref Rng & Units 09/19/2022    9:10 AM  CMP  Glucose 70 - 99 mg/dL 105   BUN 8 - 23 mg/dL 31   Creatinine 0.44 - 1.00 mg/dL 1.20   Sodium 135 - 145 mmol/L 139   Potassium 3.5 - 5.1 mmol/L 3.9   Chloride 98 - 111 mmol/L 107   CO2 22 - 32 mmol/L 26   Calcium 8.9 - 10.3 mg/dL 9.1   Total Protein 6.5 - 8.1 g/dL 7.5   Total Bilirubin 0.3 - 1.2 mg/dL 0.5   Alkaline Phos 38 - 126 U/L 70    AST 15 - 41 U/L 20   ALT 0 - 44 U/L 12       Latest Ref Rng & Units 09/19/2022    9:10 AM  CBC  WBC 4.0 - 10.5 K/uL 5.2   Hemoglobin 12.0 - 15.0 g/dL 10.2   Hematocrit 36.0 - 46.0 % 33.1   Platelets 150 - 400 K/uL 241     No images are attached to the encounter.  MR Brain W Wo Contrast  Result Date: 09/14/2022 CLINICAL DATA:  Metastatic breast cancer. Memory loss and vision changes. EXAM: MRI HEAD WITHOUT AND WITH CONTRAST TECHNIQUE: Multiplanar, multiecho pulse sequences of the brain and surrounding structures were obtained without and with intravenous contrast. CONTRAST:  6 mL Vueway COMPARISON:  Head CT 03/08/2022 and MRI 11/28/2021. Cervical spine CT 12/02/2021. Congenital C2-3 fusion. FINDINGS: Brain: There is no evidence of an acute infarct, intracranial hemorrhage, mass, midline shift, or extra-axial fluid collection. Scattered T2 hyperintensities in the cerebral white matter are similar to the prior MRI and are nonspecific but compatible with mild chronic small vessel ischemic disease. The ventricles and sulci are within normal limits for age. No abnormal enhancement is identified. Vascular: Major intracranial vascular flow voids are preserved. Skull and upper cervical spine: Partially visualized abnormal marrow T1 hypointensity more inferiorly in the cervical spine corresponding to previously shown sclerotic metastases. Mildly heterogeneous calvarial bone marrow signal without a destructive skull lesion. Sinuses/Orbits: Bilateral cataract extraction. Paranasal sinuses and mastoid air cells are clear. Other: None. IMPRESSION: 1. No evidence of intracranial metastases. 2. Mild chronic small vessel ischemic disease. 3. Partially visualized known osseous metastatic disease involving the cervical spine. Electronically Signed   By: Logan Bores M.D.   On: 09/14/2022 19:20   ECHOCARDIOGRAM COMPLETE  Result Date: 09/13/2022    ECHOCARDIOGRAM REPORT   Patient Name:   FARHIYA ROSTEN Date of  Exam: 09/13/2022 Medical Rec #:  [id number]        Height:       65.0 in Accession #:    [id number]       Weight:       141.5 lb Date of Birth:  June 20, 1946        BSA:          1.708 m Patient Age:    18 years         BP:           143/74 mmHg Patient Gender: F                HR:           71 bpm.  Exam Location:  [facility] Procedure: 2D Echo, Color Doppler, Cardiac Doppler and Strain Analysis Indications:     Breast cancer  History:         Patient has prior history of Echocardiogram examinations, most                  recent 07/06/2022. Risk Factors:Hypertension. Cellulitis of                  right lower extremity.  Sonographer:     Charmayne Sheer Referring Phys:  [id number] [place] Anna Miliyah Luper Diagnosing Phys: Kate Sable MD  Sonographer Comments: Global longitudinal strain was attempted. IMPRESSIONS  1. Left ventricular ejection fraction, by estimation, is 50 to 55%. Left ventricular ejection fraction by 2D MOD biplane is 54.2 %. The left ventricle has low normal function. The left ventricle has no regional wall motion abnormalities. Left ventricular diastolic parameters were normal. The average left ventricular global longitudinal strain is -16.2 %. The global longitudinal strain is normal.  2. Right ventricular systolic function is normal. The right ventricular size is normal.  3. The mitral valve is normal in structure. Mild mitral valve regurgitation.  4. The aortic valve is tricuspid. Aortic valve regurgitation is not visualized. Aortic valve sclerosis is present, with no evidence of aortic valve stenosis.  5. The inferior vena cava is normal in size with <50% respiratory variability, suggesting right atrial pressure of 8 mmHg. FINDINGS  Left Ventricle: Left ventricular ejection fraction, by estimation, is 50 to 55%. Left ventricular ejection fraction by 2D MOD biplane is 54.2 %. The left ventricle has low normal function. The left ventricle has no regional wall motion abnormalities. The average left ventricular global  longitudinal strain is -16.2 %. The global longitudinal strain is normal. The left ventricular internal cavity size was normal in size. There is no left ventricular hypertrophy. Left ventricular diastolic parameters were normal. Right Ventricle: The right ventricular size is normal. No increase in right ventricular wall thickness. Right ventricular systolic function is normal. Left Atrium: Left atrial size was normal in size. Right Atrium: Right atrial size was normal in size. Pericardium: There is no evidence of pericardial effusion. Mitral Valve: The mitral valve is normal in structure. Mild mitral valve regurgitation. Tricuspid Valve: The tricuspid valve is normal in structure. Tricuspid valve regurgitation is mild. Aortic Valve: The aortic valve is tricuspid. Aortic valve regurgitation is not visualized. Aortic valve sclerosis is present, with no evidence of aortic valve stenosis. Aortic valve mean gradient measures 8.0 mmHg. Aortic valve peak gradient measures 15.2 mmHg. Aortic valve area, by VTI measures 1.79 cm. Pulmonic Valve: The pulmonic valve was not well visualized. Pulmonic valve regurgitation is not visualized. Aorta: The aortic root and ascending aorta are structurally normal, with no evidence of dilitation. Venous: The inferior vena cava is normal in size with less than 50% respiratory variability, suggesting right atrial pressure of 8 mmHg. IAS/Shunts: No atrial level shunt detected by color flow Doppler.  LEFT VENTRICLE PLAX 2D                        Biplane EF (MOD) LVIDd:         4.60 cm         LV Biplane EF:   Left LVIDs:         3.90 cm  ventricular LV PW:         0.90 cm                          ejection LV IVS:        0.90 cm                          fraction by LVOT diam:     1.90 cm                          2D MOD LV SV:         70                               biplane is LV SV Index:   41                               54.2 %. LVOT Area:     2.84 cm                                 Diastology                                LV e' medial:    7.72 cm/s LV Volumes (MOD)               LV E/e' medial:  13.5 LV vol d, MOD    118.0 ml      LV e' lateral:   10.60 cm/s A2C:                           LV E/e' lateral: 9.8 LV vol d, MOD    119.0 ml A4C:                           2D LV vol s, MOD    51.0 ml       Longitudinal A2C:                           Strain LV vol s, MOD    55.8 ml       2D Strain GLS  -16.2 % A4C:                           Avg: LV SV MOD A2C:   67.0 ml LV SV MOD A4C:   119.0 ml LV SV MOD BP:    64.4 ml RIGHT VENTRICLE RV Basal diam:  4.00 cm LEFT ATRIUM             Index        RIGHT ATRIUM           Index LA diam:        4.40 cm 2.58 cm/m   RA Area:     10.90 cm LA Vol (A2C):   88.7 ml 51.94 ml/m  RA Volume:   22.20 ml  13.00 ml/m LA Vol (A4C):   49.0 ml 28.70 ml/m LA Biplane Vol: 66.2  ml 38.77 ml/m  AORTIC VALVE                     PULMONIC VALVE AV Area (Vmax):    1.83 cm      PV Vmax:       1.37 m/s AV Area (Vmean):   1.80 cm      PV Peak grad:  7.5 mmHg AV Area (VTI):     1.79 cm AV Vmax:           195.00 cm/s AV Vmean:          131.000 cm/s AV VTI:            0.389 m AV Peak Grad:      15.2 mmHg AV Mean Grad:      8.0 mmHg LVOT Vmax:         126.00 cm/s LVOT Vmean:        83.300 cm/s LVOT VTI:          0.246 m LVOT/AV VTI ratio: 0.63  AORTA Ao Root diam: 3.10 cm MITRAL VALVE MV Area (PHT): 3.27 cm     SHUNTS MV Decel Time: 232 msec     Systemic VTI:  0.25 m MV E velocity: 104.00 cm/s  Systemic Diam: 1.90 cm MV A velocity: 103.00 cm/s MV E/A ratio:  1.01 Kate Sable MD Electronically signed by Kate Sable MD Signature Date/Time: 09/13/2022/1:37:16 PM    Final      Assessment and plan- Patient is a 76 y.o. female who is here for follow-up of following issues  MetastaticHER2 positive breast cancer with bone and lymph node metastases: Her most recent CT scan from September 2023 showed stable areas of bone and lymph node metastases.  Plan is  therefore to continue with Herceptin and Perjeta until progression or toxicity.  Explained to the patient that although we have never biopsied her lymph nodes to prove that they are involved with breast cancer the fact that they have not grown tells [place] that probably we are not dealing with any alternative etiology and I do not see a reason to biopsy these lymph nodes unless there is a growth  There is a mild decrease in her ejection fraction from 55 to 60% to 50 to 55% on her recent echocardiogram.  I will plan to repeat her echo again in 3 to 4 weeks time.  Right knee pain: Likely secondary to osteoarthritis.  Will refer to orthopedics  Skin lesions mainly over forehead and some noted over bilateral handsLesions do not appear classic for scabies.  Patient is adamant about using topical Zosyn for these lesions and I do not think that is indicated.  We will have her see dermatology soon.   Visit Diagnosis 1. Right knee pain, unspecified chronicity   2. Primary malignant neoplasm of breast with metastasis ([place])   3. Encounter for monoclonal antibody treatment for malignancy      Dr. Randa Evens, MD, MPH [facility] [phone] 10/10/2022 1:36 PM

## 2022-10-10 NOTE — Progress Notes (Signed)
Pt states she will like for Dr.Rao to take a look at her skin due to small blisters on her face. States it has been very itchy and causing discomfort in her daily life .

## 2022-10-10 NOTE — Telephone Encounter (Signed)
Reached out to [place] regarding pts NP appt in March 2024 and per Dr. Janese Banks is it possible to have appt moved up. Reached the office V; provided the eeded details and asked for a return call. If no call is returned today; I'll try again tomorrow.

## 2022-10-11 ENCOUNTER — Encounter: Payer: Medicare Other | Attending: Internal Medicine | Admitting: Internal Medicine

## 2022-10-11 DIAGNOSIS — N184 Chronic kidney disease, stage 4 (severe): Secondary | ICD-10-CM | POA: Insufficient documentation

## 2022-10-11 DIAGNOSIS — Z7901 Long term (current) use of anticoagulants: Secondary | ICD-10-CM | POA: Insufficient documentation

## 2022-10-11 DIAGNOSIS — I87313 Chronic venous hypertension (idiopathic) with ulcer of bilateral lower extremity: Secondary | ICD-10-CM | POA: Diagnosis not present

## 2022-10-11 DIAGNOSIS — I129 Hypertensive chronic kidney disease with stage 1 through stage 4 chronic kidney disease, or unspecified chronic kidney disease: Secondary | ICD-10-CM | POA: Insufficient documentation

## 2022-10-11 DIAGNOSIS — C79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: Secondary | ICD-10-CM | POA: Diagnosis not present

## 2022-10-11 DIAGNOSIS — L97822 Non-pressure chronic ulcer of other part of left lower leg with fat layer exposed: Secondary | ICD-10-CM | POA: Insufficient documentation

## 2022-10-11 DIAGNOSIS — Z86718 Personal history of other venous thrombosis and embolism: Secondary | ICD-10-CM | POA: Insufficient documentation

## 2022-10-11 DIAGNOSIS — I872 Venous insufficiency (chronic) (peripheral): Secondary | ICD-10-CM | POA: Diagnosis not present

## 2022-10-11 NOTE — Progress Notes (Signed)
CHARLESIA, Laura Santiago ([id number]) [id number] Page 1 of 10 Visit Report for 10/11/2022 HPI Details Patient Name: Date of Service: Laura Santiago. 10/11/2022 9:45 A M Medical Record Number: [id number] Patient Account Number: [id number] Date of Birth/Sex: Treating RN: [birth year] (76 y.o. [name] Primary Care Provider: [facility] Other Clinician: [name] Referring Provider: Treating Provider/Extender: RO BSO N, [place] in Treatment: 66 History of Present Illness HPI Description: Admission 7/27 Ms. Laura Santiago is a 76 year old female with a past medical history of ADHD, metastatic breast cancer, stage IV chronic kidney disease, history of DVT on Xarelto and chronic venous insufficiency that presents to the clinic for a chronic left lower extremity wound. She recently moved to [facility] 4 days ago. She was being followed by wound care center in [name]. She reports a 10-year history of wounds to her left lower extremity that eventually do heal with debridement and compression therapy. She states that the current wound reopened 4 months ago and she is using Vaseline and Coban. She denies signs of infection. 8/3; patient presents for 1 week follow-up. She reports no issues or complaints today. She states she had vascular studies done in the last week. She denies signs of infection. She brought her little service dog with her today. 8/17; patient presents for follow-up. She has missed her last clinic appointment. She states she took the wrap off and attempted to rewrap her leg. She is having difficulty with transportation. She has her service dog with her today. Overall she feels well and reports improvement in wound healing. She denies signs of infection. She reports owning an old Velcro wrap compression and has this at her living facility 9/14; patient presents for follow-up. Patient states that over  the past 2 to 3 weeks she developed toe wounds to her right foot. She attributes this to tight fitting shoes. She subsequently developed cellulitis in the right leg and has been treated by doxycycline by her oncologist. She reports improvement in symptoms however continues to have some redness and swelling to this leg. T the left lower extremity patient has been having her wraps changed with home health twice weekly. She states that the [facility] is not helping control o the drainage. Other than that she has no issues or complaints today. She denies signs of infection to the left lower extremity. 9/21; patient presents for follow-up. She reports seeing infectious disease for her cellulitis. She reports no further management. She has home health that changes the wraps twice weekly. She has no issues or complaints today. She denies signs of infection. 10/5; patient presents for follow-up. She has no issues or complaints today. She denies signs of infection. She states that the right great toe has not been dressed by home health. 10/12; patient presents for follow-up. She has no issues or complaints today. She reports improvement in her wound healing. She has been using silver alginate to the right great toe wound. She denies signs of infection. 10/26; patient presents for follow-up. Home health did not have sorbact so they continued to use Hydrofera Blue under the wrap. She has been using silver alginate to the great toe wound however she did not have a dressing in place today. She currently denies signs of infection. 11/2; patient presents for follow-up. She has been using sorb act under the compression wrap. She reports using silver alginate to the toe wound again she does not have a dressing in  place. She currently denies signs of infection. 11/23; patient presents for follow-up. Unfortunately she has missed her last 2 clinic appointments. She was last seen 3 weeks ago. She did her  own compression wrap with Kerlix and Coban yesterday after seeing vein and vascular. She has not been dressing her right great toe wound. She currently denies signs of infection. 11/30; patient presents for 1 week follow-up. She states she changed her dressing last week prior to home health and use sorb act with Dakin's and Hydrofera Blue. Home health has changed the dressing as well and they have been using sorbact. T oday she reports increased redness to her right lower extremity. She has a history of cellulitis to this leg. She has been using silver alginate to the right great toe. Unfortunately she had an episode of diarrhea prior to coming in and had feces all over the right leg and to the wrap of her left leg. 12/7; patient presents for 1 week follow-up. She states that home health did not come out to change the dressing and she took it off yesterday. It is unclear if she is dressing the right toe wound. She denies signs of infection. 12/14; patient presents for 1 week follow-up. She has no issues or complaints today. 12/21; patient presents for follow-up. She has no issues or complaints today. She denies signs of infection. 12/28/2021; patient presents for follow-up. She was hospitalized for sepsis secondary to right lower extremity cellulitis On 12/23. She states she is currently at a SNF. She states that she was started on doxycycline this morning for her right great toe swelling and redness. She is not sure what dressings have been done to her left lower extremity for the past 3 weeks. She says its been mainly gauze with an Santiago wrap. 1/25; patient presents for follow-up. She is still residing in a skilled nursing facility. She reports mild pain to the left lower extremity wound bed. She states she is going to see a podiatrist soon. Laura Santiago ([id number]) [id number] Page 2 of 10 2/8; patient presents for follow-up. She has moved back to her residential  community from her skilled nursing facility. She has no issues or complaints today. She denies signs of systemic infections. 2/15; patient presents for follow-up. He has no issues or complaints today. She denies systemic signs of infection. 2/22; patient presents for follow-up. She has no issues or complaints today. She denies signs of infection. 3/1; patient presents for follow-up. She states that home health came out the day after she was seen in our clinic and yesterday to do the wrap change. She denies signs of infection. She reports excoriated skin on the ankle. 3/8; patient presents for follow-up. She has no issues or complaints today. She denies signs of infection. 3/15; patient presents for follow-up. Home health has been coming out to change the dressings. She reports more tenderness to the wound site. She denies purulent drainage, increased warmth or erythema to the area. 4/5; patient presents for follow-up. She has missed her last 2 clinic appointments. I have not seen her in 3 weeks. She was recently hospitalized for altered mental status. She was involuntarily committed. She was evaluated by psychiatry and deemed to have competency. There was no specific cause of her altered mental status. It was concluded that her physical and mental health were declining due to her chronic medical conditions. Currently home health has been coming out for dressing changes. Patient has also been doing her own dressing changes. She reports  more skin breakdown to the periwound and now has a new wound. She denies fever/chills. She reports continued tenderness to the wound site. 4/12; patient with significant venous insufficiency and a large wound on her left lower leg taking up about 80% of the circumference of her lower leg. Cultures of this grew MRSA and Pseudomonas. She had completed a course of ciprofloxacin now is starting doxycycline. She has been using Dakin's wet-to-dry and a Tubigrip. She has home  health twice a week and we change it once. 4/19; patient presents for follow-up. She completed her course of doxycycline. She has been using Dakin's wet-to-dry dressing and Tubigrip. Home health changes the dressing twice weekly. Currently she has no issues or complaints. 4/26; patient presents for follow-up. At last clinic visit orders for home health were Iodosorb under compression therapy. Unfortunately they did not have the dressing and have been using Dakin's and gentamicin under the wrap. Patient currently denies signs of infection. She has no issues or complaints today. 5/3; patient presents for follow-up. Again Iodosorb has not been used under the compression therapy when home health comes out to change the wrap and dressing. They have been using Sorbact. It is unclear why this is happening since we send orders weekly to the agency. She denies signs of infection. Patient has not purchased the [place] antibiotics. We reached out to the company and they said they have been trying to contact her on a regular basis. We gave the patient the number to call to order the medication. 5/10; patient presents for follow-up. She has no issues or complaints today. Again home health has not been using Iodosorb. Mepilex was on the wound bed. No other dressings noted. She brought in her Keystone antibiotics. She denies signs of infection. 5/17; patient presents for follow-up. Home health has come out twice since she was last seen. Joint well she has been using Keystone antibiotic with Sorbact under the compression wrap. She has no issues or complaints today. She denies signs of infection. 5/24; patient presents for follow-up. We have been using Keystone antibiotics with Sorbact under compression therapy. She is tolerating the treatment well. She is reporting improvement in wound healing. She denies signs of infection. 5/31; patient presents for follow-up. We continue to do [facility] antibiotics with Sorbact  under compression therapy. She continues to report improvement in wound healing. Home health comes out and changes the dressing once weekly. 05-17-2022 upon evaluation today patient appears to be doing better in regard to her wound especially compared to the last time I saw her. Fortunately I do think that she is seeing improvements. With that being said I do believe that she may be benefit from sharp debridement today to clear away some of the necrotic debris I discussed that with her as well. She is an amendable to that plan. Otherwise she is very pleased with how the [employer] is doing for her. 6/14; patient presents for follow-up. We have been using Keystone antibiotic with Sorbact and absorbent dressings under 3 layer compression. She has no issues or complaints today. She reports improvement in wound healing. She denies signs of infection. 6/21; patient presents for follow-up. We are continuing with [facility] antibiotic and Sorbact under 3 layer compression. Patient has no complaints. Continued wound healing is happening. She denies signs of infection. 6/28; patient presents for follow-up. We have been using Keystone antibiotic with Sorbact under 3 layer compression. Usually home health comes out and changes the dressing twice a week. Unfortunately they did not go  out to change the dressing. It is unclear why. Patient did not call them. She currently denies signs of infection. 7/5; patient presents for follow-up. We have been using Keystone antibiotic with calcium alginate under 3 layer compression. She reports improvement in wound healing. She denies signs of infection. Home health has come out to do dressing changes twice this past week. 7/12; patient presents for follow-up. We have been using Keystone antibiotic with calcium alginate under 3 layer compression. Patient states that home health came out once last week to change the dressing. She reports improvement in wound healing. She currently  denies signs of infection. 7/19; patient presents for follow-up. We have been using Keystone antibiotic with calcium alginate under 3 layer compression. Home health came out once last week to change the dressing. She has no issues or complaints today. She denies signs of infection. 8/2; patient presents for follow-up. We have been using Keystone antibiotic with calcium alginate under 3 layer compression. Unfortunately she missed her appointment last week and home health did not come out to do dressing changes. Patient currently denies signs of infection. 8/9; patient presents for follow-up. We have been using Keystone with calcium alginate under 3 layer compression. She states that home health came out once last week. She currently denies signs of infection. Her wrap was completely wet. She states she was cleaning the top of the leg and water soaked down into the wrap. 8/16; patient presents for follow-up. We have been using Keystone with calcium alginate under 3 layer compression. She states that home health came out twice last week. She has no issues or complaints today. 8/23; patient presents for follow-up. He has been using Keystone with calcium alginate under 3 layer compression. Home health came out twice last week. She denies signs of infection. 8/30; patient presents for follow-up. We have been using Keystone with calcium alginate under 3 layer compression. Home health came out once last week to change the dressing. Patient reports improvement in wound healing. She states she is almost done with her chemotherapy infusions and has 1 more left. 9/13; patient presents for follow-up. She has lost the capsules to her [facility] antibiotic which I believe is the vancomycin pills. She has her Zosyn powder today. We have been using Keystone antibiotic ointment with calcium alginate under 3 layer compression. She is concerned about systemic infection however her vitals are stable and there is no  surrounding soft tissue infection. She would like to remain a patient in our wound care center however would like a second opinion for her wound care at another facility. She asked to be referred to [facility] wound care center. NATAYA, BASTEDO ([id number]) [id number] Page 3 of 10 9/20; patient presents for follow-up. She found her vancomycin capsules and brought in her complete Keystone antibiotic ointment set today. Unfortunately she has developed skin breakdown and Erythema to the right lower extremity With increased swelling. She states she went to a pow wow Over the weekend and was on her feet for extended periods of time. She saw her oncologist yesterday who prescribed her doxycycline for her right lower extremity erythema. 9/27; patient presents for follow-up. We have been using Keystone antibiotic with Aquacel under 3 layer compression to the lower extremities bilaterally. When home health came and changed the wrap she secretly put coffee into the spray mix along with [facility] antibiotic on her leg thinking the acidic component would better activate the zoysn (sonething she discussed with her microbiologist brother). She has reported improvement in  wound healing. 10/4; patient presents for follow-up. She has no issues or complaints today. We have been doing Aquacel and keystone under 3 layer compression to the lower extremities bilaterally. This morning she took the right lower extremity wrap off as it was uncomfortable. She has no open wounds to this leg. 10/11; patient presents for follow-up. We have been doing Aquacel with Keystone antibiotic ointment under 3 layer compression to the left lower extremity. She developed a small blister to the anterior aspect of the left leg noticed when the wrap was taken off on intake. She currently denies signs of infection. 10/18; patient presents for follow-up. We have been doing Aquacel with Keystone antibiotic ointment under 3  layer compression to the left lower extremity. There has been continued improvement in wound healing. She denies signs of infection. 10/25; patient arrives for treatment of venous insufficiency ulcers on her left lower leg both lateral and medial are remanence of apparently a circumferential wound. Much improved. We are using topicals Keystone and Aquacel Ag under 3 layer compression we continue to make good progress. The patient talk to me at some length with regards to different things she has on her forehead and her Peri orbital area for which she is apparently applying Parsons. She feels that what ever we are treating on her wounds is a more systemic problem. I really was not able to get a handle on what she is talking about however I did caution her not to put the [place] in her eyes. 11/1; her wounds continue to improve she is using Keystone and Aquacel Ag G under 3 layer compression. Our intake nurse notes erythema and edema in the right leg. The patient has a litany of concerns with regards to a rash on her forehead or ears and other systemic complaints. She has an appointment with dermatology on November 11 Electronic Signature(s) Signed: 10/11/2022 3:59:15 PM By: Linton Ham MD Entered By: Linton Ham on 10/11/2022 10:45:55 -------------------------------------------------------------------------------- Physical Exam Details Patient Name: Date of Service: [name] 10/11/2022 9:45 A M Medical Record Number: [id number] Patient Account Number: [id number] Date of Birth/Sex: Treating RN: [birth year] (76 y.o. [name] Primary Care Provider: [place] Other Clinician: [name] Referring Provider: Treating Provider/Extender: RO BSO N, [place] in Treatment: 66 Constitutional Sitting or standing Blood Pressure is within target range for patient.. Pulse regular and within target range for patient.Marland Kitchen Respirations regular,  non-labored and within target range.. Temperature is normal and within the target range for the patient.. Notes Wound exam; the areas on the left medial and left lateral ankle actually look better. Her pedal pulses are palpable at both the dorsalis pedis and posterior tibia. She does not have good edema control with swelling above the ankle area and very adherent skin in the ankle region. She does not have any open wounds on the right leg I think all of this is chronic venous hypertension with stasis dermatitis. On her forehead area she has a rash at the hairline scaling behind her ears I think this is probably stasis dermatitis Electronic Signature(s) Signed: 10/11/2022 3:59:15 PM By: Linton Ham MD Entered By: Linton Ham on 10/11/2022 10:47:19 [name] ([id number]) [id number] Page 4 of 10 -------------------------------------------------------------------------------- Physician Orders Details Patient Name: Date of Service: Laura Santiago. 10/11/2022 9:45 A M Medical Record Number: [id number] Patient Account Number: [id number] Date of Birth/Sex: Treating RN: [birth year] (76 y.o. [name] Primary Care  Provider: [place] Other Clinician: [name] Referring Provider: Treating Provider/Extender: RO BSO N, [place] in Treatment: 7 Verbal / Phone Orders: No Diagnosis Coding Follow-up Appointments Return Appointment in 1 week. Nurse Visit as needed [place]: - [place] for wound care. May utilize formulary equivalent dressing for wound treatment orders unless otherwise specified. Home Health Nurse may visit PRN to address patients wound care needs. **Please direct any NON-WOUND related issues/requests for orders to patient's Primary Care Physician. **If current dressing causes regression in wound condition, may D/C ordered dressing product/s and  apply Normal Saline Moist Dressing daily until next [place] or Other MD appointment. **Notify Wound Healing Center of regression in wound condition at [phone]. Other Home Health Orders/Instructions: - Dressing change 3 x weekly, twice by home health and once at wound clinic weekly. PLEASE make sure frequency between dressing changes is appropriate. Bathing/ Shower/ Hygiene May shower with wound dressing protected with water repellent cover or cast protector. No tub bath. Anesthetic (Use 'Patient Medications' Section for Anesthetic Order Entry) Lidocaine applied to wound bed Edema Control - Lymphedema / Segmental Compressive Device / Other Optional: One layer of unna paste to top of compression wrap (to act as an anchor). - PLEASE when applying wrap start from toes and go up to just below knee 4 Layer Compression System Lymphedema. - left lower leg Tubigrip single layer applied. - Tubi D single right leg Elevate, Exercise Daily and A void Standing for Long Periods of Time. Elevate legs to the level of the heart and pump ankles as often as possible Elevate leg(s) parallel to the floor when sitting. DO YOUR BEST to sleep in the bed at night. DO NOT sleep in your recliner. Long hours of sitting in a recliner leads to swelling of the legs and/or potential wounds on your backside. Additional Orders / Instructions Follow Nutritious Diet and Increase Protein Intake Medications-Please add to medication list. Keystone Compound Wound Treatment Wound #1 - Lower Leg Wound Laterality: Left, Medial Cleanser: Wound Cleanser (Home Health) 3 x Per Week/30 Days Discharge Instructions: Wash your hands with soap and water. Remove old dressing, discard into plastic bag and place into trash. Cleanse the wound with Wound Cleanser prior to applying a clean dressing using gauze sponges, not tissues or cotton balls. Do not scrub or use excessive force. Pat dry using gauze sponges, not tissue or  cotton balls. Peri-Wound Care: Nystatin Cream USP 30 (g) 3 x Per Week/30 Days Discharge Instructions: Use Nystatin Cream as directed. MIx 1:1 with TCA cream and applied to peri wound Peri-Wound Care: Triamcinolone Acetonide Cream, 0.1%, 15 (g) tube 3 x Per Week/30 Days Discharge Instructions: Apply as directed. mix 1:1 with nystatin cream for peri wound Prim Dressing: Aquacel Extra Hydrofiber Dressing, 4x5 (in/in) ary 3 x Per Week/30 Days Prim Dressing: keystone compound 3 x Per Week/30 Days ary Discharge Instructions: apply to wound bed as directed on medication label PLEASE follow direction on pill bottle for proper mixing ARELENE, MORONI ([id number]) [id number] Page 5 of 10 Secondary Dressing: Zetuvit Absorbent Pad, 4x8 (in/in) 3 x Per Week/30 Days Compression Wrap: Medichoice 4 layer Compression System, 35-40 mmHG 3 x Per Week/30 Days Discharge Instructions: Apply multi-layer wrap as directed. Electronic Signature(s) Signed: 10/11/2022 3:59:15 PM By: Linton Ham MD Signed: 10/11/2022 4:40:40 PM By: [name] Entered By: [name] on 10/11/2022 10:30:52 -------------------------------------------------------------------------------- Problem List Details Patient Name: Date of Service:  [place], BO [name] 10/11/2022 9:45 A M Medical Record Number: [id number] Patient Account Number: [id number] Date of Birth/Sex: Treating RN: [birth year] (76 y.o. [name] Primary Care Provider: [place] Other Clinician: [name] Referring Provider: Treating Provider/Extender: RO BSO N, [place] in Treatment: 66 Active Problems ICD-10 Encounter Code Description Active Date MDM Diagnosis 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sed11/23/2022 No Yes 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 11/02/2021 No Yes I87.311  Chronic venous hypertension (idiopathic) with ulcer of right lower extremity 08/30/2022 No Yes I87.2 Venous insufficiency (chronic) (peripheral) 07/06/2021 No Yes Z79.01 Long term (current) use of anticoagulants 07/06/2021 No Yes I10 Essential (primary) hypertension 07/06/2021 No Yes 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 07/06/2021 No Yes Inactive Problems ICD-10 Code Description Active Date Inactive Date S81.802A Unspecified open wound, left lower leg, initial encounter 07/06/2021 07/06/2021 TINLEY, ROUGHT ([id number]) [id number] Page 6 of 10 S91.101A Unspecified open wound of right great toe without damage to nail, initial encounter 08/24/2021 08/24/2021 S91.104A Unspecified open wound of right lesser toe(s) without damage to nail, initial encounter 08/24/2021 08/24/2021 Resolved Problems ICD-10 Code Description Active Date Resolved Date S91.104D Unspecified open wound of right lesser toe(s) without damage to nail, subsequent 08/31/2021 08/31/2021 encounter S91.201D Unspecified open wound of right great toe with damage to nail, subsequent encounter 08/31/2021 08/31/2021 Electronic Signature(s) Signed: 10/11/2022 3:59:15 PM By: Linton Ham MD Entered By: Linton Ham on 10/11/2022 10:45:05 -------------------------------------------------------------------------------- Progress Note Details Patient Name: Date of Service: [name] 10/11/2022 9:45 A M Medical Record Number: [id number] Patient Account Number: [id number] Date of Birth/Sex: Treating RN: 1946/01/03 (76 y.o. [name] Primary Care Provider: [place] Other Clinician: [name] Referring Provider: Treating Provider/Extender: RO BSO N, [place] in Treatment: 66 Subjective History of Present Illness (HPI) Admission 7/27 Ms. Shritha Bresee is a 76 year old female with a past medical history of ADHD, metastatic breast cancer, stage IV  chronic kidney disease, history of DVT on Xarelto and chronic venous insufficiency that presents to the clinic for a chronic left lower extremity wound. She recently moved to [facility] 4 days ago. She was being followed by wound care center in [name]. She reports a 10-year history of wounds to her left lower extremity that eventually do heal with debridement and compression therapy. She states that the current wound reopened 4 months ago and she is using Vaseline and Coban. She denies signs of infection. 8/3; patient presents for 1 week follow-up. She reports no issues or complaints today. She states she had vascular studies done in the last week. She denies signs of infection. She brought her little service dog with her today. 8/17; patient presents for follow-up. She has missed her last clinic appointment. She states she took the wrap off and attempted to rewrap her leg. She is having difficulty with transportation. She has her service dog with her today. Overall she feels well and reports improvement in wound healing. She denies signs of infection. She reports owning an old Velcro wrap compression and has this at her living facility 9/14; patient presents for follow-up. Patient states that over the past 2 to 3 weeks she developed toe wounds to her right foot. She attributes this to tight fitting shoes. She subsequently developed cellulitis in the right leg and has been treated by doxycycline by  her oncologist. She reports improvement in symptoms however continues to have some redness and swelling to this leg. T the left lower extremity patient has been having her wraps changed with home health twice weekly. She states that the [facility] is not helping control o the drainage. Other than that she has no issues or complaints today. She denies signs of infection to the left lower extremity. 9/21; patient presents for follow-up. She reports seeing infectious disease for her  cellulitis. She reports no further management. She has home health that changes the wraps twice weekly. She has no issues or complaints today. She denies signs of infection. 10/5; patient presents for follow-up. She has no issues or complaints today. She denies signs of infection. She states that the right great toe has not been dressed by home health. 10/12; patient presents for follow-up. She has no issues or complaints today. She reports improvement in her wound healing. She has been using silver alginate to the right great toe wound. She denies signs of infection. 10/26; patient presents for follow-up. Home health did not have sorbact so they continued to use Hydrofera Blue under the wrap. She has been using silver EARLYN, SYLVAN ([id number]) [id number] Page 7 of 10 alginate to the great toe wound however she did not have a dressing in place today. She currently denies signs of infection. 11/2; patient presents for follow-up. She has been using sorb act under the compression wrap. She reports using silver alginate to the toe wound again she does not have a dressing in place. She currently denies signs of infection. 11/23; patient presents for follow-up. Unfortunately she has missed her last 2 clinic appointments. She was last seen 3 weeks ago. She did her own compression wrap with Kerlix and Coban yesterday after seeing vein and vascular. She has not been dressing her right great toe wound. She currently denies signs of infection. 11/30; patient presents for 1 week follow-up. She states she changed her dressing last week prior to home health and use sorb act with Dakin's and Hydrofera Blue. Home health has changed the dressing as well and they have been using sorbact. T oday she reports increased redness to her right lower extremity. She has a history of cellulitis to this leg. She has been using silver alginate to the right great toe. Unfortunately she had an  episode of diarrhea prior to coming in and had feces all over the right leg and to the wrap of her left leg. 12/7; patient presents for 1 week follow-up. She states that home health did not come out to change the dressing and she took it off yesterday. It is unclear if she is dressing the right toe wound. She denies signs of infection. 12/14; patient presents for 1 week follow-up. She has no issues or complaints today. 12/21; patient presents for follow-up. She has no issues or complaints today. She denies signs of infection. 12/28/2021; patient presents for follow-up. She was hospitalized for sepsis secondary to right lower extremity cellulitis On 12/23. She states she is currently at a SNF. She states that she was started on doxycycline this morning for her right great toe swelling and redness. She is not sure what dressings have been done to her left lower extremity for the past 3 weeks. She says its been mainly gauze with an Santiago wrap. 1/25; patient presents for follow-up. She is still residing in a skilled nursing facility. She reports mild pain to the left lower extremity wound bed. She states she  is going to see a podiatrist soon. 2/8; patient presents for follow-up. She has moved back to her residential community from her skilled nursing facility. She has no issues or complaints today. She denies signs of systemic infections. 2/15; patient presents for follow-up. He has no issues or complaints today. She denies systemic signs of infection. 2/22; patient presents for follow-up. She has no issues or complaints today. She denies signs of infection. 3/1; patient presents for follow-up. She states that home health came out the day after she was seen in our clinic and yesterday to do the wrap change. She denies signs of infection. She reports excoriated skin on the ankle. 3/8; patient presents for follow-up. She has no issues or complaints today. She denies signs of infection. 3/15; patient  presents for follow-up. Home health has been coming out to change the dressings. She reports more tenderness to the wound site. She denies purulent drainage, increased warmth or erythema to the area. 4/5; patient presents for follow-up. She has missed her last 2 clinic appointments. I have not seen her in 3 weeks. She was recently hospitalized for altered mental status. She was involuntarily committed. She was evaluated by psychiatry and deemed to have competency. There was no specific cause of her altered mental status. It was concluded that her physical and mental health were declining due to her chronic medical conditions. Currently home health has been coming out for dressing changes. Patient has also been doing her own dressing changes. She reports more skin breakdown to the periwound and now has a new wound. She denies fever/chills. She reports continued tenderness to the wound site. 4/12; patient with significant venous insufficiency and a large wound on her left lower leg taking up about 80% of the circumference of her lower leg. Cultures of this grew MRSA and Pseudomonas. She had completed a course of ciprofloxacin now is starting doxycycline. She has been using Dakin's wet-to-dry and a Tubigrip. She has home health twice a week and we change it once. 4/19; patient presents for follow-up. She completed her course of doxycycline. She has been using Dakin's wet-to-dry dressing and Tubigrip. Home health changes the dressing twice weekly. Currently she has no issues or complaints. 4/26; patient presents for follow-up. At last clinic visit orders for home health were Iodosorb under compression therapy. Unfortunately they did not have the dressing and have been using Dakin's and gentamicin under the wrap. Patient currently denies signs of infection. She has no issues or complaints today. 5/3; patient presents for follow-up. Again Iodosorb has not been used under the compression therapy when home  health comes out to change the wrap and dressing. They have been using Sorbact. It is unclear why this is happening since we send orders weekly to the agency. She denies signs of infection. Patient has not purchased the [place] antibiotics. We reached out to the company and they said they have been trying to contact her on a regular basis. We gave the patient the number to call to order the medication. 5/10; patient presents for follow-up. She has no issues or complaints today. Again home health has not been using Iodosorb. Mepilex was on the wound bed. No other dressings noted. She brought in her Keystone antibiotics. She denies signs of infection. 5/17; patient presents for follow-up. Home health has come out twice since she was last seen. Joint well she has been using Keystone antibiotic with Sorbact under the compression wrap. She has no issues or complaints today. She denies signs of infection.  5/24; patient presents for follow-up. We have been using Keystone antibiotics with Sorbact under compression therapy. She is tolerating the treatment well. She is reporting improvement in wound healing. She denies signs of infection. 5/31; patient presents for follow-up. We continue to do [facility] antibiotics with Sorbact under compression therapy. She continues to report improvement in wound healing. Home health comes out and changes the dressing once weekly. 05-17-2022 upon evaluation today patient appears to be doing better in regard to her wound especially compared to the last time I saw her. Fortunately I do think that she is seeing improvements. With that being said I do believe that she may be benefit from sharp debridement today to clear away some of the necrotic debris I discussed that with her as well. She is an amendable to that plan. Otherwise she is very pleased with how the [employer] is doing for her. 6/14; patient presents for follow-up. We have been using Keystone antibiotic with Sorbact and  absorbent dressings under 3 layer compression. She has no issues or complaints today. She reports improvement in wound healing. She denies signs of infection. 6/21; patient presents for follow-up. We are continuing with [facility] antibiotic and Sorbact under 3 layer compression. Patient has no complaints. Continued wound healing is happening. She denies signs of infection. 6/28; patient presents for follow-up. We have been using Keystone antibiotic with Sorbact under 3 layer compression. Usually home health comes out and changes the dressing twice a week. Unfortunately they did not go out to change the dressing. It is unclear why. Patient did not call them. She currently denies signs of infection. 7/5; patient presents for follow-up. We have been using Keystone antibiotic with calcium alginate under 3 layer compression. She reports improvement in wound healing. She denies signs of infection. Home health has come out to do dressing changes twice this past week. LARRISSA, STIVERS ([id number]) [id number] Page 8 of 10 7/12; patient presents for follow-up. We have been using Keystone antibiotic with calcium alginate under 3 layer compression. Patient states that home health came out once last week to change the dressing. She reports improvement in wound healing. She currently denies signs of infection. 7/19; patient presents for follow-up. We have been using Keystone antibiotic with calcium alginate under 3 layer compression. Home health came out once last week to change the dressing. She has no issues or complaints today. She denies signs of infection. 8/2; patient presents for follow-up. We have been using Keystone antibiotic with calcium alginate under 3 layer compression. Unfortunately she missed her appointment last week and home health did not come out to do dressing changes. Patient currently denies signs of infection. 8/9; patient presents for follow-up. We have been  using Keystone with calcium alginate under 3 layer compression. She states that home health came out once last week. She currently denies signs of infection. Her wrap was completely wet. She states she was cleaning the top of the leg and water soaked down into the wrap. 8/16; patient presents for follow-up. We have been using Keystone with calcium alginate under 3 layer compression. She states that home health came out twice last week. She has no issues or complaints today. 8/23; patient presents for follow-up. He has been using Keystone with calcium alginate under 3 layer compression. Home health came out twice last week. She denies signs of infection. 8/30; patient presents for follow-up. We have been using Keystone with calcium alginate under 3 layer compression. Home health came out once last week to change the  dressing. Patient reports improvement in wound healing. She states she is almost done with her chemotherapy infusions and has 1 more left. 9/13; patient presents for follow-up. She has lost the capsules to her [facility] antibiotic which I believe is the vancomycin pills. She has her Zosyn powder today. We have been using Keystone antibiotic ointment with calcium alginate under 3 layer compression. She is concerned about systemic infection however her vitals are stable and there is no surrounding soft tissue infection. She would like to remain a patient in our wound care center however would like a second opinion for her wound care at another facility. She asked to be referred to [facility] wound care center. 9/20; patient presents for follow-up. She found her vancomycin capsules and brought in her complete Keystone antibiotic ointment set today. Unfortunately she has developed skin breakdown and Erythema to the right lower extremity With increased swelling. She states she went to a pow wow Over the weekend and was on her feet for extended periods of time. She saw her oncologist yesterday who  prescribed her doxycycline for her right lower extremity erythema. 9/27; patient presents for follow-up. We have been using Keystone antibiotic with Aquacel under 3 layer compression to the lower extremities bilaterally. When home health came and changed the wrap she secretly put coffee into the spray mix along with [facility] antibiotic on her leg thinking the acidic component would better activate the zoysn (sonething she discussed with her microbiologist brother). She has reported improvement in wound healing. 10/4; patient presents for follow-up. She has no issues or complaints today. We have been doing Aquacel and keystone under 3 layer compression to the lower extremities bilaterally. This morning she took the right lower extremity wrap off as it was uncomfortable. She has no open wounds to this leg. 10/11; patient presents for follow-up. We have been doing Aquacel with Keystone antibiotic ointment under 3 layer compression to the left lower extremity. She developed a small blister to the anterior aspect of the left leg noticed when the wrap was taken off on intake. She currently denies signs of infection. 10/18; patient presents for follow-up. We have been doing Aquacel with Keystone antibiotic ointment under 3 layer compression to the left lower extremity. There has been continued improvement in wound healing. She denies signs of infection. 10/25; patient arrives for treatment of venous insufficiency ulcers on her left lower leg both lateral and medial are remanence of apparently a circumferential wound. Much improved. We are using topicals Keystone and Aquacel Ag under 3 layer compression we continue to make good progress. The patient talk to me at some length with regards to different things she has on her forehead and her Peri orbital area for which she is apparently applying [place]. She feels that what ever we are treating on her wounds is a more systemic problem. I really was not able to  get a handle on what she is talking about however I did caution her not to put the [place] in her eyes. 11/1; her wounds continue to improve she is using Keystone and Aquacel Ag G under 3 layer compression. Our intake nurse notes erythema and edema in the right leg. The patient has a litany of concerns with regards to a rash on her forehead or ears and other systemic complaints. She has an appointment with dermatology on November 11 Objective Constitutional Sitting or standing Blood Pressure is within target range for patient.. Pulse regular and within target range for patient.Marland Kitchen Respirations regular, non-labored and within  target range.. Temperature is normal and within the target range for the patient.. Vitals Time Taken: 10:05 AM, Height: 66 in, Weight: 153 lbs, BMI: 24.7, Temperature: 98.0 F, Pulse: 101 bpm, Respiratory Rate: 18 breaths/min, Blood Pressure: 136/72 mmHg. General Notes: Wound exam; the areas on the left medial and left lateral ankle actually look better. Her pedal pulses are palpable at both the dorsalis pedis and posterior tibia. She does not have good edema control with swelling above the ankle area and very adherent skin in the ankle region. She does not have any open wounds on the right leg I think all of this is chronic venous hypertension with stasis dermatitis. On her forehead area she has a rash at the hairline scaling behind her ears I think this is probably stasis dermatitis Integumentary (Hair, Skin) Wound #1 status is Open. Original cause of wound was Gradually Appeared. The date acquired was: 04/06/2021. The wound has been in treatment 66 weeks. The wound is located on the Left,Medial Lower Leg. The wound measures 4cm length x 9.4cm width x 0.2cm depth; 29.531cm^2 area and 5.906cm^3 volume. There is Fat Layer (Subcutaneous Tissue) exposed. There is a medium amount of serosanguineous drainage noted. The wound margin is flat and intact. There is medium (34-66%) red,  pink granulation within the wound bed. There is a medium (34-66%) amount of necrotic tissue within the wound bed including Adherent Slough. ARTHELIA, [name] ([id number]) [id number] Page 9 of 10 Assessment Active Problems ICD-10 Non-pressure chronic ulcer of other part of left lower leg with fat layer exposed Chronic venous hypertension (idiopathic) with ulcer of left lower extremity Chronic venous hypertension (idiopathic) with ulcer of right lower extremity Venous insufficiency (chronic) (peripheral) Long term (current) use of anticoagulants Essential (primary) hypertension Secondary malignant neoplasm of breast Procedures Wound #1 Pre-procedure diagnosis of Wound #1 is a Venous Leg Ulcer located on the Left,Medial Lower Leg . There was a Four Layer Compression Therapy Procedure with a pre-treatment ABI of 1.5 by Cornell Barman, RN. Post procedure Diagnosis Wound #1: Same as Pre-Procedure Plan Follow-up Appointments: Return Appointment in 1 week. Nurse Visit as needed Home Health: [place]: - [place] for wound care. May utilize formulary equivalent dressing for wound treatment orders unless otherwise specified. Home Health Nurse may visit PRN to address patientoos wound care needs. **Please direct any NON-WOUND related issues/requests for orders to patient's Primary Care Physician. **If current dressing causes regression in wound condition, may D/C ordered dressing product/s and apply Normal Saline Moist Dressing daily until next [place] or Other MD appointment. **Notify Wound Healing Center of regression in wound condition at [phone]. Other Home Health Orders/Instructions: - Dressing change 3 x weekly, twice by home health and once at wound clinic weekly. PLEASE make sure frequency between dressing changes is appropriate. Bathing/ Shower/ Hygiene: May shower with wound dressing protected with water  repellent cover or cast protector. No tub bath. Anesthetic (Use 'Patient Medications' Section for Anesthetic Order Entry): Lidocaine applied to wound bed Edema Control - Lymphedema / Segmental Compressive Device / Other: Optional: One layer of unna paste to top of compression wrap (to act as an anchor). - PLEASE when applying wrap start from toes and go up to just below knee 4 Layer Compression System Lymphedema. - left lower leg Tubigrip single layer applied. - Tubi D single right leg Elevate, Exercise Daily and Avoid Standing for Long Periods of Time. Elevate legs to the level of the heart and pump ankles as often  as possible Elevate leg(s) parallel to the floor when sitting. DO YOUR BEST to sleep in the bed at night. DO NOT sleep in your recliner. Long hours of sitting in a recliner leads to swelling of the legs and/or potential wounds on your backside. Additional Orders / Instructions: Follow Nutritious Diet and Increase Protein Intake Medications-Please add to medication list.: Keystone Compound WOUND #1: - Lower Leg Wound Laterality: Left, Medial Cleanser: Wound Cleanser (Home Health) 3 x Per Week/30 Days Discharge Instructions: Wash your hands with soap and water. Remove old dressing, discard into plastic bag and place into trash. Cleanse the wound with Wound Cleanser prior to applying a clean dressing using gauze sponges, not tissues or cotton balls. Do not scrub or use excessive force. Pat dry using gauze sponges, not tissue or cotton balls. Peri-Wound Care: Nystatin Cream USP 30 (g) 3 x Per Week/30 Days Discharge Instructions: Use Nystatin Cream as directed. MIx 1:1 with TCA cream and applied to peri wound Peri-Wound Care: Triamcinolone Acetonide Cream, 0.1%, 15 (g) tube 3 x Per Week/30 Days Discharge Instructions: Apply as directed. mix 1:1 with nystatin cream for peri wound Prim Dressing: Aquacel Extra Hydrofiber Dressing, 4x5 (in/in) 3 x Per Week/30 Days ary Prim Dressing:  keystone compound 3 x Per Week/30 Days ary Discharge Instructions: apply to wound bed as directed on medication label PLEASE follow direction on pill bottle for proper mixing Secondary Dressing: Zetuvit Absorbent Pad, 4x8 (in/in) 3 x Per Week/30 Days Com pression Wrap: Medichoice 4 layer Compression System, 35-40 mmHG 3 x Per Week/30 Days Discharge Instructions: Apply multi-layer wrap as directed. 1. There is no reason to change the primary dressing which is her [employer] with Aquacel Ag. Unfortunately few she did not bring in the vancomycin tablets but fortunately she has home health changing and I will add the vancomycin on Friday. 2. She clearly has significant venous hypertension and stasis dermatitis and will need stockings for both legs 3. I wonder about seborrheic dermatitis on her scalp and behind her ears. She tells me she already uses Selsun Blue shampoo and she has a dermatology appointment on November 11. I asked her to stop putting the ketoconazole on her forehead in preparation for her dermatology appointment 4. I increased her compression wrap to 4 layers on the left leg to help control the edema. TCA to the periwound area DIVINE, IMBER ([id number]) [id number] Page 10 of 10 Electronic Signature(s) Signed: 10/11/2022 3:59:15 PM By: Linton Ham MD Entered By: Linton Ham on 10/11/2022 10:48:59 -------------------------------------------------------------------------------- SuperBill Details Patient Name: Date of Service: [name], [place]. 10/11/2022 Medical Record Number: [id number] Patient Account Number: [id number] Date of Birth/Sex: Treating RN: October 19, 1946 (76 y.o. 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 Venous insufficiency (chronic) (peripheral) Z79.01 Long term (current) use of anticoagulants I10 Essential (primary) hypertension 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 Facility Procedures : CPT4 Code: [id number] Description: (Facility Use Only) [phone] - [place] LWR LT LEG ICD-10 Diagnosis Description 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sed Modifier: Quantity: 1 Physician Procedures :  CPT4 Code Description Modifier [id number] - WC PHYS LEVEL 4 - EST PT ICD-10 Diagnosis Description 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.312 Chronic venous hypertension (idiopathic) with ulcer  of left lower extremity Quantity: 1 Electronic Signature(s) Signed: 10/11/2022 3:59:15 PM By: Linton Ham MD Entered By: Linton Ham on 10/11/2022 10:49:22

## 2022-10-11 NOTE — Progress Notes (Signed)
MAZIAH, SMOLA ([id number]) [id number] Page 1 of 9 Visit Report for 10/11/2022 Arrival Information Details Patient Name: Date of Service: Laura Santiago. 10/11/2022 9:45 A M Medical Record Number: [id number] Patient Account Number: [id number] Date of Birth/Sex: Treating RN: May 16, 1946 (76 y.o. [name] Primary Care Yuval Nolet: [place] Other Clinician: [name] Referring Eyvonne Burchfield: Treating Delylah Stanczyk/Extender: RO BSO N, [place] in Treatment: 66 Visit Information History Since Last Visit All ordered tests and consults were completed: No Patient Arrived: [name] Added or deleted any medications: No Arrival Time: 10:03 Any new allergies or adverse reactions: No Transfer Assistance: None Had a fall or experienced change in No Patient Requires Transmission-Based No activities of daily living that may affect Precautions: risk of falls: Patient Has Alerts: Yes Hospitalized since last visit: No Patient Alerts: PT HAS SERVICE Pain Present Now: Yes ANIMAL ABI 07/11/21 R) 1.16 L) 1.27 Electronic Signature(s) Signed: 10/11/2022 4:40:40 PM By: [name] Entered By: [name] on 10/11/2022 10:05:08 -------------------------------------------------------------------------------- Clinic Level of Care Assessment Details Patient Name: Date of Service: Laura Santiago. 10/11/2022 9:45 A M Medical Record Number: [id number] Patient Account Number: [id number] Date of Birth/Sex: Treating RN: [birth year] (76 y.o. [name] Primary Care Jerome Otter: [place] Other Clinician: [name] Referring Latifa Noble: Treating Bryson Gavia/Extender: RO BSO N, [place] in Treatment: 66 Clinic Level of Care Assessment Items TOOL 1 Quantity Score _0  - 0 Use when EandM and Procedure is performed on INITIAL visit ASSESSMENTS - Nursing Assessment / Reassessment _1  - 0 General  Physical Exam (combine w/ comprehensive assessment (listed just below) when performed on new pt. evals) _2  - 0 Comprehensive Assessment (HX, ROS, Risk Assessments, Wounds Hx, etc.) ASSESSMENTS - Wound and Skin Assessment / Reassessment _3  - 0 Dermatologic / Skin Assessment (not related to wound area) PRECIOUS, SEGALL ([id number]) [id number] Page 2 of 9 ASSESSMENTS - Ostomy and/or Continence Assessment and Care _4  - 0 Incontinence Assessment and Management _5  - 0 Ostomy Care Assessment and Management (repouching, etc.) PROCESS - Coordination of Care _6  - 0 Simple Patient / Family Education for ongoing care _7  - 0 Complex (extensive) Patient / Family Education for ongoing care _8  - 0 Staff obtains Programmer, systems, Records, T Results / Process Orders est _9  - 0 Staff telephones HHA, Nursing Homes / Clarify orders / etc _10  - 0 Routine Transfer to another Facility (non-emergent condition) _11  - 0 Routine Hospital Admission (non-emergent condition) _12  - 0 New Admissions / Biomedical engineer / Ordering NPWT Apligraf, etc. , _13  - 0 Emergency Hospital Admission (emergent condition) PROCESS - Special Needs _14  - 0 Pediatric / Minor Patient Management _15  - 0 Isolation Patient Management _16  - 0 Hearing / Language / Visual special needs _17  - 0 Assessment of Community assistance (transportation, D/C planning, etc.) _18  - 0 Additional assistance / Altered mentation _19  - 0 Support Surface(s) Assessment (bed, cushion, seat, etc.) INTERVENTIONS - Miscellaneous _20  - 0 External ear exam _21  - 0 Patient Transfer (multiple staff / Civil Service fast streamer / Similar devices) _22  - 0 Simple Staple / Suture removal (25 or less) _23  - 0 Complex Staple / Suture removal (26 or more) _24  - 0 Hypo/Hyperglycemic Management (do not check if billed separately) _25  - 0 Ankle / Brachial Index (ABI) - do not check if billed separately Has the patient been seen at the hospital within the last  three years: Yes Total Score:  0 Level Of Care: ____ Electronic Signature(s) Signed: 10/11/2022 4:40:40 PM By: [name] Entered By: [name] on 10/11/2022 10:30:59 -------------------------------------------------------------------------------- Compression Therapy Details Patient Name: Date of Service: [name] 10/11/2022 9:45 A M Medical Record Number: [id number] Patient Account Number: [id number] Date of Birth/Sex: Treating RN: 06/18/1946 (76 y.o. [name] Primary Care Shamiah Kahler: [place] Other Clinician: [name] Referring Elke Holtry: Treating Jereme Loren/Extender: RO BSO N, [place] in Treatment: 66 Compression Therapy Performed for Wound Assessment: Wound #1 Left,Medial Lower Leg Performed By: Charlesetta Garibaldi, RN Compression Type: Four Moses Manners ([id number]) [id number] Page 3 of 9 Pre Treatment ABI: 1.5 Post Procedure Diagnosis Same as Pre-procedure Electronic Signature(s) Signed: 10/11/2022 4:40:40 PM By: [name] Entered By: [name] on 10/11/2022 10:30:40 -------------------------------------------------------------------------------- Encounter Discharge Information Details Patient Name: Date of Service: [name] 10/11/2022 9:45 A M Medical Record Number: [id number] Patient Account Number: [id number] Date of Birth/Sex: Treating RN: 1946-09-08 (76 y.o. [name] Primary Care Dhani Imel: [place] Other Clinician: [name] Referring Irvan Tiedt: Treating Jaydrian Corpening/Extender: RO BSO N, [place] in Treatment: 66 Encounter Discharge Information Items Discharge Condition: Stable Ambulatory Status: [place] Discharge Destination: Home Transportation: Other Accompanied By: self Schedule Follow-up Appointment: Yes Clinical Summary of Care: Electronic Signature(s) Signed: 10/11/2022 4:40:40 PM By:  [name] Entered By: [name] on 10/11/2022 11:08:27 -------------------------------------------------------------------------------- Lower Extremity Assessment Details Patient Name: Date of Service: Laura Santiago. 10/11/2022 9:45 A M Medical Record Number: [id number] Patient Account Number: [id number] Date of Birth/Sex: Treating RN: 06/10/1946 (76 y.o. [name] Primary Care Jolanta Cabeza: [place] Other Clinician: [name] Referring Keven Osborn: Treating Letanya Froh/Extender: RO BSO Delane Ginger, [place] in Treatment: 66 Edema Assessment Assessed: [Left: Yes] [Right: No] Edema: [Left: Ye] [Right: s] L[LeftSAYRA, FRISBY ([id number])] [Right: [id number] Page 4 of 9] Calf Left: Right: Point of Measurement: 30 cm From Medial Instep 37.9 cm Ankle Left: Right: Point of Measurement: 10 cm From Medial Instep 22.52 cm Vascular Assessment Pulses: Dorsalis Pedis Palpable: [Left:Yes] Electronic Signature(s) Signed: 10/11/2022 4:40:40 PM By: [name] Signed: 10/11/2022 5:26:54 PM By: Gretta Cool, BSN, RN, CWS, [name] RN, BSN Entered By: [name] on 10/11/2022 10:21:56 -------------------------------------------------------------------------------- Multi Wound Chart Details Patient Name: Date of Service: [name] 10/11/2022 9:45 A M Medical Record Number: [id number] Patient Account Number: [id number] Date of Birth/Sex: Treating RN: Jul 09, 1946 (76 y.o. [name] Primary Care Kenlee Vogt: [place] Other Clinician: [name] Referring Nadav Swindell: Treating Nalda Shackleford/Extender: RO BSO N, [place] in Treatment: 66 Vital Signs Height(in): 66 Pulse(bpm): 101 Weight(lbs): 153 Blood Pressure(mmHg): 136/72 Body Mass Index(BMI): 24.7 Temperature(F): 98.0 Respiratory Rate(breaths/min): 18 [1:Photos:] [N/A:N/A] Left, Medial Lower Leg N/A N/A Wound  Location: Gradually Appeared N/A N/A Wounding Event: Venous Leg Ulcer N/A N/A Primary Etiology: Hypertension, Osteoarthritis, ReceivedN/A N/A Comorbid HistoryBRIONA, KORPELA ([id number]) [id number] Page 5 of 9 Chemotherapy, Received Radiation 04/06/2021 N/A N/A Date Acquired: 105 N/A N/A Weeks of Treatment: Open N/A N/A Wound Status: No N/A N/A Wound Recurrence: Yes N/A N/A Clustered Wound: 4x9.4x0.2 N/A N/A Measurements L x W x D (cm) 29.531 N/A N/A A (cm) : rea 5.906 N/A N/A Volume (cm) : 67.30% N/A N/A % Reduction in Area: 67.30% N/A N/A % Reduction in Volume: Full Thickness Without Exposed  N/A N/A Classification: Support Structures Medium N/A N/A Exudate Amount: Serosanguineous N/A N/A Exudate Type: red, brown N/A N/A Exudate Color: Flat and Intact N/A N/A Wound Margin: Medium (34-66%) N/A N/A Granulation Amount: Red, Pink N/A N/A Granulation Quality: Medium (34-66%) N/A N/A Necrotic Amount: Fat Layer (Subcutaneous Tissue): Yes N/A N/A Exposed Structures: Fascia: No Tendon: No Muscle: No Joint: No Bone: No Small (1-33%) N/A N/A Epithelialization: Treatment Notes Electronic Signature(s) Signed: 10/11/2022 4:40:40 PM By: [name] Entered By: [name] on 10/11/2022 10:22:12 -------------------------------------------------------------------------------- Multi-Disciplinary Care Plan Details Patient Name: Date of Service: [name] 10/11/2022 9:45 A M Medical Record Number: [id number] Patient Account Number: [id number] Date of Birth/Sex: Treating RN: 06-01-1946 (76 y.o. [name] Primary Care Salle Brandle: [place] Other Clinician: [name] Referring Halleigh Comes: Treating Makalynn Berwanger/Extender: RO BSO N, [place] in Treatment: 66 Active Inactive Soft Tissue Infection Nursing Diagnoses: Impaired tissue integrity Potential for infection: soft  tissue Goals: Patient's soft tissue infection will resolve Date Initiated: 03/15/2022 Target Resolution Date: 04/27/2022 Goal Status: Active Signs and symptoms of infection will be recognized early to allow for prompt treatment Date Initiated: 03/15/2022 Date Inactivated: 04/26/2022 Target Resolution Date: 04/27/2022 Goal Status: Met Interventions: Assess signs and symptoms of infection every visit MERINA, BEHRENDT ([id number]) [id number] Page 6 of 9 Treatment Activities: Culture and sensitivity : 03/15/2022 Notes: Wound/Skin Impairment Nursing Diagnoses: Impaired tissue integrity Goals: Patient/caregiver will verbalize understanding of skin care regimen Date Initiated: 07/06/2021 Date Inactivated: 07/27/2021 Target Resolution Date: 07/06/2021 Goal Status: Met Ulcer/skin breakdown will have a volume reduction of 30% by week 4 Date Initiated: 07/06/2021 Date Inactivated: 10/12/2021 Target Resolution Date: 08/06/2021 Goal Status: Unmet Unmet Reason: cont tx Ulcer/skin breakdown will have a volume reduction of 50% by week 8 Date Initiated: 07/06/2021 Target Resolution Date: 09/06/2021 Goal Status: Active Ulcer/skin breakdown will have a volume reduction of 80% by week 12 Date Initiated: 07/06/2021 Target Resolution Date: 10/06/2021 Goal Status: Active Ulcer/skin breakdown will heal within 14 weeks Date Initiated: 07/06/2021 Target Resolution Date: 11/06/2021 Goal Status: Active Interventions: Assess patient/caregiver ability to obtain necessary supplies Assess patient/caregiver ability to perform ulcer/skin care regimen upon admission and as needed Assess ulceration(s) every visit Treatment Activities: Referred to DME Eldrige Pitkin for dressing supplies : 07/06/2021 Skin care regimen initiated : 07/06/2021 Notes: Electronic Signature(s) Signed: 10/11/2022 4:40:40 PM By: [name] Signed: 10/11/2022 5:26:54 PM By: Gretta Cool, BSN, RN, CWS, [name] RN, BSN Entered By:  [name] on 10/11/2022 10:22:01 -------------------------------------------------------------------------------- Pain Assessment Details Patient Name: Date of Service: [name] 10/11/2022 9:45 A M Medical Record Number: [id number] Patient Account Number: [id number] Date of Birth/Sex: Treating RN: [birth year] (76 y.o. [name] Primary Care Zoe Goonan: [place] Other Clinician: [name] Referring Susen Haskew: Treating Stone Spirito/Extender: RO BSO N, [place] in Treatment: 66 Active Problems Location of Pain Severity and Description of Pain Patient Has Paino Yes Site Locations Pain LocationSYLINA, HENION ([id number]) [id number] Page 7 of 9 Pain Location: Generalized Pain Duration of the Pain. Constant / Intermittento Constant Rate the pain. Current Pain Level: 6 Character of Pain Describe the Pain: Aching, Burning, Throbbing Pain Management and Medication Current Pain Management: Medication: Yes Rest: Yes Electronic Signature(s) Signed: 10/11/2022 4:40:40 PM By: [name] Signed: 10/11/2022 5:26:54 PM By: Gretta Cool, BSN, RN, CWS, [name] RN, BSN Entered By: [name] on 10/11/2022 10:09:31 -------------------------------------------------------------------------------- Patient/Caregiver Education Details Patient Name: Date of  Service: Laura Santiago 11/1/2023andnbsp9:45 A M Medical Record Number: [id number] Patient Account Number: [id number] Date of Birth/Gender: Treating RN: 11-05-1946 (76 y.o. [name] Primary Care Physician: [place] Other Clinician: [name] Referring Physician: Treating Physician/Extender: RO BSO N, [place] in Treatment: 63 Education Assessment Education Provided To: Patient Education Topics Provided Wound/Skin Impairment: Handouts: Other: continue wound care as directed Methods:  Explain/Verbal Responses: State content correctly Electronic Signature(s) Signed: 10/11/2022 4:40:40 PM By: [name] Entered By: [name] on 10/11/2022 10:31:48 [name] ([id number]) [id number] Page 8 of 9 -------------------------------------------------------------------------------- Wound Assessment Details Patient Name: Date of Service: Laura Santiago. 10/11/2022 9:45 A M Medical Record Number: [id number] Patient Account Number: [id number] Date of Birth/Sex: Treating RN: [birth year] (76 y.o. [name] Primary Care Dawood Spitler: [place] Other Clinician: [name] Referring Taneshia Lorence: Treating Xavien Dauphinais/Extender: RO BSO N, [place] in Treatment: 66 Wound Status Wound Number: 1 Primary Venous Leg Ulcer Etiology: Wound Location: Left, Medial Lower Leg Wound Status: Open Wounding Event: Gradually Appeared Comorbid Hypertension, Osteoarthritis, Received Chemotherapy, Date Acquired: 04/06/2021 History: Received Radiation Weeks Of Treatment: 66 Clustered Wound: Yes Photos Wound Measurements Length: (cm) 4 Width: (cm) 9.4 Depth: (cm) 0.2 Area: (cm) 29.531 Volume: (cm) 5.906 % Reduction in Area: 67.3% % Reduction in Volume: 67.3% Epithelialization: Small (1-33%) Wound Description Classification: Full Thickness Without Exposed Support Structures Wound Margin: Flat and Intact Exudate Amount: Medium Exudate Type: Serosanguineous Exudate Color: red, brown Foul Odor After Cleansing: No Slough/Fibrino Yes Wound Bed Granulation Amount: Medium (34-66%) Exposed Structure Granulation Quality: Red, Pink Fascia Exposed: No Necrotic Amount: Medium (34-66%) Fat Layer (Subcutaneous Tissue) Exposed: Yes Necrotic Quality: Adherent Slough Tendon Exposed: No Muscle Exposed: No Joint Exposed: No Bone Exposed: No Treatment Notes Wound #1 (Lower Leg) Wound Laterality: Left,  Medial Cleanser Wound Cleanser Discharge Instruction: Wash your hands with soap and water. Remove old dressing, discard into plastic bag and place into trash. Cleanse the wound with Wound Cleanser prior to applying a clean dressing using gauze sponges, not tissues or cotton balls. Do not scrub or use excessive SHILEE, BIGGS ([id number]) [id number] Page 9 of 9 force. Pat dry using gauze sponges, not tissue or cotton balls. Peri-Wound Care Nystatin Cream USP 30 (g) Discharge Instruction: Use Nystatin Cream as directed. MIx 1:1 with TCA cream and applied to peri wound Triamcinolone Acetonide Cream, 0.1%, 15 (g) tube Discharge Instruction: Apply as directed. mix 1:1 with nystatin cream for peri wound Topical Primary Dressing Aquacel Extra Hydrofiber Dressing, 4x5 (in/in) keystone compound Discharge Instruction: apply to wound bed as directed on medication label PLEASE follow direction on pill bottle for proper mixing Secondary Dressing Zetuvit Absorbent Pad, 4x8 (in/in) Secured With Compression Wrap Medichoice 4 layer Compression System, 35-40 mmHG Discharge Instruction: Apply multi-layer wrap as directed. Compression Stockings Add-Ons Electronic Signature(s) Signed: 10/11/2022 4:40:40 PM By: [name] Signed: 10/11/2022 5:26:54 PM By: Gretta Cool, BSN, RN, CWS, [name] RN, BSN Entered By: [name] on 10/11/2022 10:20:52 -------------------------------------------------------------------------------- Vitals Details Patient Name: Date of Service: [name] 10/11/2022 9:45 A M Medical Record Number: [id number] Patient Account Number: [id number] Date of Birth/Sex: Treating RN: 1946-11-04 (76 y.o. [name] Primary Care Demetria Iwai: [place] Other Clinician: [name] Referring Hershell Brandl: Treating Mileena Rothenberger/Extender: RO BSO N, [place] in Treatment: 66 Vital Signs Time Taken: 10:05 Temperature  (  F): 98.0 Height (in): 66 Pulse (bpm): 101 Weight (lbs): 153 Respiratory Rate (breaths/min): 18 Body Mass Index (BMI): 24.7 Blood Pressure (mmHg): 136/72 Reference Range: 80 - 120 mg / dl Electronic Signature(s) Signed: 10/11/2022 4:40:40 PM By: [name] Entered By: [name] on 10/11/2022 10:08:51

## 2022-10-18 ENCOUNTER — Encounter (HOSPITAL_BASED_OUTPATIENT_CLINIC_OR_DEPARTMENT_OTHER): Payer: Medicare Other | Admitting: Internal Medicine

## 2022-10-18 DIAGNOSIS — I87313 Chronic venous hypertension (idiopathic) with ulcer of bilateral lower extremity: Secondary | ICD-10-CM | POA: Diagnosis not present

## 2022-10-18 DIAGNOSIS — L97822 Non-pressure chronic ulcer of other part of left lower leg with fat layer exposed: Secondary | ICD-10-CM | POA: Diagnosis not present

## 2022-10-18 DIAGNOSIS — I87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: Secondary | ICD-10-CM | POA: Diagnosis not present

## 2022-10-19 NOTE — Progress Notes (Signed)
Laura Santiago, Laura Santiago ([id number]) [id number] Page 1 of 13 Visit Report for 10/18/2022 Chief Complaint Document Details Patient Name: Date of Service: Laura Ace. 10/18/2022 9:45 A M Medical Record Number: [id number] Patient Account Number: [id number] Date of Birth/Sex: Treating RN: 1946-02-29 (76 y.o. Laura Santiago Primary Care Provider: [place] Other Clinician: [name] Referring Provider: Treating Provider/Extender: [name] Weeks in Treatment: 59 Information Obtained from: Patient Chief Complaint Left lower extremity wound Right toe wounds Left upper lateral thigh wounds Electronic Signature(s) Signed: 10/18/2022 2:02:44 PM By: Kalman Shan DO Entered By: Kalman Shan on 10/18/2022 11:21:44 -------------------------------------------------------------------------------- Debridement Details Patient Name: Date of Service: Laura Santiago [name] 10/18/2022 9:45 A M Medical Record Number: [id number] Patient Account Number: [id number] Date of Birth/Sex: Treating RN: 10-15-1946 (76 y.o. Laura Santiago Primary Care Provider: [place] Other Clinician: [name] Referring Provider: Treating Provider/Extender: [name] Weeks in Treatment: 67 Debridement Performed for Assessment: Wound #1 Left,Medial Lower Leg Performed By: Physician Kalman Shan, MD Debridement Type: Debridement Severity of Tissue Pre Debridement: Fat layer exposed Level of Consciousness (Pre-procedure): Awake and Alert Pre-procedure Verification/Time Out Yes - 10:40 Taken: Start Time: 10:40 T Area Debrided (L x W): otal 3 (cm) x 3 (cm) = 9 (cm) Tissue and other material debrided: Viable, Non-Viable, Slough, Subcutaneous, Slough Level: Skin/Subcutaneous Tissue Debridement Description: Excisional Instrument: Curette Bleeding: Minimum Hemostasis Achieved: Pressure Response to  Treatment: Procedure was tolerated well Level of Consciousness (Santiago- Awake and Alert MIOSOTIS, WETSEL ([id number]) [id number] Page 2 of 13 Awake and Alert procedure): Santiago Debridement Measurements of Total Wound Length: (cm) 5.5 Width: (cm) 1.5 Depth: (cm) 0.2 Volume: (cm) 1.296 Character of Wound/Ulcer Santiago Debridement: Stable Severity of Tissue Santiago Debridement: Fat layer exposed Santiago Procedure Diagnosis Same as Pre-procedure Electronic Signature(s) Signed: 10/18/2022 2:01:12 PM By: Gretta Cool, BSN, RN, CWS, [name] RN, BSN Signed: 10/18/2022 2:02:44 PM By: Kalman Shan DO Signed: 10/19/2022 12:52:59 PM By: [name] Entered By: [name] on 10/18/2022 10:45:03 -------------------------------------------------------------------------------- Debridement Details Patient Name: Date of Service: Laura Santiago [name] 10/18/2022 9:45 A M Medical Record Number: [id number] Patient Account Number: [id number] Date of Birth/Sex: Treating RN: 06/18/1946 (76 y.o. Laura Santiago Primary Care Provider: [place] Other Clinician: [name] Referring Provider: Treating Provider/Extender: [name] Weeks in Treatment: 67 Debridement Performed for Assessment: Wound #8 Left,Lateral Lower Leg Performed By: Physician Kalman Shan, MD Debridement Type: Debridement Level of Consciousness (Pre-procedure): Awake and Alert Pre-procedure Verification/Time Out Yes - 10:45 Taken: Start Time: 10:45 T Area Debrided (L x W): otal 2.3 (cm) x 2.3 (cm) = 5.29 (cm) Tissue and other material debrided: Viable, Non-Viable, Slough, Subcutaneous, Slough Level: Skin/Subcutaneous Tissue Debridement Description: Excisional Instrument: Curette Bleeding: Minimum Hemostasis Achieved: Pressure Response to Treatment: Procedure was tolerated well Level of Consciousness (Santiago- Awake and Alert procedure): Santiago Debridement Measurements of  Total Wound Length: (cm) 2.3 Stage: Category/Stage II Width: (cm) 2.3 Depth: (cm) 0.3 Volume: (cm) 1.246 Character of Wound/Ulcer Santiago Debridement: Stable Santiago Procedure Diagnosis Same as Pre-procedure Electronic Signature(s) Signed: 10/18/2022 2:01:12 PM By: Gretta Cool, BSN, RN, CWS, [name] RN, BSN Signed: 10/18/2022 2:02:44 PM By: Kalman Shan DO Signed: 10/19/2022 12:52:59 PM By: [name] Entered By: [name] on 10/18/2022 11:10:35 Laura Santiago ([id number]) [id number] Page 3 of 13 -------------------------------------------------------------------------------- HPI Details Patient Name: Date of Service: Laura Santiago [name] 10/18/2022 9:45 A M Medical  Record Number: [id number] Patient Account Number: [id number] Date of Birth/Sex: Treating RN: [birth year] (76 y.o. Laura Santiago Primary Care Provider: [place] Other Clinician: [name] Referring Provider: Treating Provider/Extender: Kalman Shan [facility] Weeks in Treatment: 36 History of Present Illness HPI Description: Admission 7/27 Laura Santiago is a 75 year old female with a past medical history of ADHD, metastatic breast cancer, stage IV chronic kidney disease, history of DVT on Xarelto and chronic venous insufficiency that presents to the clinic for a chronic left lower extremity wound. She recently moved to [facility] 4 days ago. She was being followed by wound care center in [name]. She reports a 10-year history of wounds to her left lower extremity that eventually do heal with debridement and compression therapy. She states that the current wound reopened 4 months ago and she is using Vaseline and Coban. She denies signs of infection. 8/3; patient presents for 1 week follow-up. She reports no issues or complaints today. She states she had vascular studies done in the last week. She denies signs of infection. She brought her little  service dog with her today. 8/17; patient presents for follow-up. She has missed her last clinic appointment. She states she took the wrap off and attempted to rewrap her leg. She is having difficulty with transportation. She has her service dog with her today. Overall she feels well and reports improvement in wound healing. She denies signs of infection. She reports owning an old Velcro wrap compression and has this at her living facility 9/14; patient presents for follow-up. Patient states that over the past 2 to 3 weeks she developed toe wounds to her right foot. She attributes this to tight fitting shoes. She subsequently developed cellulitis in the right leg and has been treated by doxycycline by her oncologist. She reports improvement in symptoms however continues to have some redness and swelling to this leg. T the left lower extremity patient has been having her wraps changed with home health twice weekly. She states that the [facility] is not helping control o the drainage. Other than that she has no issues or complaints today. She denies signs of infection to the left lower extremity. 9/21; patient presents for follow-up. She reports seeing infectious disease for her cellulitis. She reports no further management. She has home health that changes the wraps twice weekly. She has no issues or complaints today. She denies signs of infection. 10/5; patient presents for follow-up. She has no issues or complaints today. She denies signs of infection. She states that the right great toe has not been dressed by home health. 10/12; patient presents for follow-up. She has no issues or complaints today. She reports improvement in her wound healing. She has been using silver alginate to the right great toe wound. She denies signs of infection. 10/26; patient presents for follow-up. Home health did not have sorbact so they continued to use Hydrofera Blue under the wrap. She has been using  silver alginate to the great toe wound however she did not have a dressing in place today. She currently denies signs of infection. 11/2; patient presents for follow-up. She has been using sorb act under the compression wrap. She reports using silver alginate to the toe wound again she does not have a dressing in place. She currently denies signs of infection. 11/23; patient presents for follow-up. Unfortunately she has missed her last 2 clinic appointments. She was last seen 3 weeks ago. She did her own compression  wrap with Kerlix and Coban yesterday after seeing vein and vascular. She has not been dressing her right great toe wound. She currently denies signs of infection. 11/30; patient presents for 1 week follow-up. She states she changed her dressing last week prior to home health and use sorb act with Dakin's and Hydrofera Blue. Home health has changed the dressing as well and they have been using sorbact. T oday she reports increased redness to her right lower extremity. She has a history of cellulitis to this leg. She has been using silver alginate to the right great toe. Unfortunately she had an episode of diarrhea prior to coming in and had feces all over the right leg and to the wrap of her left leg. 12/7; patient presents for 1 week follow-up. She states that home health did not come out to change the dressing and she took it off yesterday. It is unclear if she is dressing the right toe wound. She denies signs of infection. 12/14; patient presents for 1 week follow-up. She has no issues or complaints today. 12/21; patient presents for follow-up. She has no issues or complaints today. She denies signs of infection. 12/28/2021; patient presents for follow-up. She was hospitalized for sepsis secondary to right lower extremity cellulitis On 12/23. She states she is currently at a SNF. She states that she was started on doxycycline this morning for her right great toe swelling and redness. She  is not sure what dressings have been done to her left lower extremity for the past 3 weeks. She says its been mainly gauze with an Ace wrap. 1/25; patient presents for follow-up. She is still residing in a skilled nursing facility. She reports mild pain to the left lower extremity wound bed. She states she is going to see a podiatrist soon. 2/8; patient presents for follow-up. She has moved back to her residential community from her skilled nursing facility. She has no issues or complaints today. She denies signs of systemic infections. Laura Santiago, Laura Santiago ([id number]) [id number] Page 4 of 13 2/15; patient presents for follow-up. He has no issues or complaints today. She denies systemic signs of infection. 2/22; patient presents for follow-up. She has no issues or complaints today. She denies signs of infection. 3/1; patient presents for follow-up. She states that home health came out the day after she was seen in our clinic and yesterday to do the wrap change. She denies signs of infection. She reports excoriated skin on the ankle. 3/8; patient presents for follow-up. She has no issues or complaints today. She denies signs of infection. 3/15; patient presents for follow-up. Home health has been coming out to change the dressings. She reports more tenderness to the wound site. She denies purulent drainage, increased warmth or erythema to the area. 4/5; patient presents for follow-up. She has missed her last 2 clinic appointments. I have not seen her in 3 weeks. She was recently hospitalized for altered mental status. She was involuntarily committed. She was evaluated by psychiatry and deemed to have competency. There was no specific cause of her altered mental status. It was concluded that her physical and mental health were declining due to her chronic medical conditions. Currently home health has been coming out for dressing changes. Patient has also been doing her own  dressing changes. She reports more skin breakdown to the periwound and now has a new wound. She denies fever/chills. She reports continued tenderness to the wound site. 4/12; patient with significant venous insufficiency and a large wound  on her left lower leg taking up about 80% of the circumference of her lower leg. Cultures of this grew MRSA and Pseudomonas. She had completed a course of ciprofloxacin now is starting doxycycline. She has been using Dakin's wet-to-dry and a Tubigrip. She has home health twice a week and we change it once. 4/19; patient presents for follow-up. She completed her course of doxycycline. She has been using Dakin's wet-to-dry dressing and Tubigrip. Home health changes the dressing twice weekly. Currently she has no issues or complaints. 4/26; patient presents for follow-up. At last clinic visit orders for home health were Iodosorb under compression therapy. Unfortunately they did not have the dressing and have been using Dakin's and gentamicin under the wrap. Patient currently denies signs of infection. She has no issues or complaints today. 5/3; patient presents for follow-up. Again Iodosorb has not been used under the compression therapy when home health comes out to change the wrap and dressing. They have been using Sorbact. It is unclear why this is happening since we send orders weekly to the agency. She denies signs of infection. Patient has not purchased the [place] antibiotics. We reached out to the company and they said they have been trying to contact her on a regular basis. We gave the patient the number to call to order the medication. 5/10; patient presents for follow-up. She has no issues or complaints today. Again home health has not been using Iodosorb. Mepilex was on the wound bed. No other dressings noted. She brought in her Keystone antibiotics. She denies signs of infection. 5/17; patient presents for follow-up. Home health has come out twice since  she was last seen. Joint well she has been using Keystone antibiotic with Sorbact under the compression wrap. She has no issues or complaints today. She denies signs of infection. 5/24; patient presents for follow-up. We have been using Keystone antibiotics with Sorbact under compression therapy. She is tolerating the treatment well. She is reporting improvement in wound healing. She denies signs of infection. 5/31; patient presents for follow-up. We continue to do [facility] antibiotics with Sorbact under compression therapy. She continues to report improvement in wound healing. Home health comes out and changes the dressing once weekly. 05-17-2022 upon evaluation today patient appears to be doing better in regard to her wound especially compared to the last time I saw her. Fortunately I do think that she is seeing improvements. With that being said I do believe that she may be benefit from sharp debridement today to clear away some of the necrotic debris I discussed that with her as well. She is an amendable to that plan. Otherwise she is very pleased with how the [employer] is doing for her. 6/14; patient presents for follow-up. We have been using Keystone antibiotic with Sorbact and absorbent dressings under 3 layer compression. She has no issues or complaints today. She reports improvement in wound healing. She denies signs of infection. 6/21; patient presents for follow-up. We are continuing with [facility] antibiotic and Sorbact under 3 layer compression. Patient has no complaints. Continued wound healing is happening. She denies signs of infection. 6/28; patient presents for follow-up. We have been using Keystone antibiotic with Sorbact under 3 layer compression. Usually home health comes out and changes the dressing twice a week. Unfortunately they did not go out to change the dressing. It is unclear why. Patient did not call them. She currently denies signs of infection. 7/5; patient presents for  follow-up. We have been using Keystone antibiotic with calcium  alginate under 3 layer compression. She reports improvement in wound healing. She denies signs of infection. Home health has come out to do dressing changes twice this past week. 7/12; patient presents for follow-up. We have been using Keystone antibiotic with calcium alginate under 3 layer compression. Patient states that home health came out once last week to change the dressing. She reports improvement in wound healing. She currently denies signs of infection. 7/19; patient presents for follow-up. We have been using Keystone antibiotic with calcium alginate under 3 layer compression. Home health came out once last week to change the dressing. She has no issues or complaints today. She denies signs of infection. 8/2; patient presents for follow-up. We have been using Keystone antibiotic with calcium alginate under 3 layer compression. Unfortunately she missed her appointment last week and home health did not come out to do dressing changes. Patient currently denies signs of infection. 8/9; patient presents for follow-up. We have been using Keystone with calcium alginate under 3 layer compression. She states that home health came out once last week. She currently denies signs of infection. Her wrap was completely wet. She states she was cleaning the top of the leg and water soaked down into the wrap. 8/16; patient presents for follow-up. We have been using Keystone with calcium alginate under 3 layer compression. She states that home health came out twice last week. She has no issues or complaints today. 8/23; patient presents for follow-up. He has been using Keystone with calcium alginate under 3 layer compression. Home health came out twice last week. She denies signs of infection. 8/30; patient presents for follow-up. We have been using Keystone with calcium alginate under 3 layer compression. Home health came out once last week  to change the dressing. Patient reports improvement in wound healing. She states she is almost done with her chemotherapy infusions and has 1 more left. 9/13; patient presents for follow-up. She has lost the capsules to her [facility] antibiotic which I believe is the vancomycin pills. She has her Zosyn powder today. We have been using Keystone antibiotic ointment with calcium alginate under 3 layer compression. She is concerned about systemic infection however her vitals are stable and there is no surrounding soft tissue infection. She would like to remain a patient in our wound care center however would like a second opinion for her wound care at another facility. She asked to be referred to [facility] wound care center. 9/20; patient presents for follow-up. She found her vancomycin capsules and brought in her complete Keystone antibiotic ointment set today. Unfortunately she has developed skin breakdown and Erythema to the right lower extremity With increased swelling. She states she went to a [employer] Over the weekend Laura Santiago, Laura Santiago ([id number]) [id number] Page 5 of 13 and was on her feet for extended periods of time. She saw her oncologist yesterday who prescribed her doxycycline for her right lower extremity erythema. 9/27; patient presents for follow-up. We have been using Keystone antibiotic with Aquacel under 3 layer compression to the lower extremities bilaterally. When home health came and changed the wrap she secretly put coffee into the spray mix along with [facility] antibiotic on her leg thinking the acidic component would better activate the zoysn (sonething she discussed with her microbiologist brother). She has reported improvement in wound healing. 10/4; patient presents for follow-up. She has no issues or complaints today. We have been doing Aquacel and keystone under 3 layer compression to the lower extremities bilaterally. This morning she took  the right lower  extremity wrap off as it was uncomfortable. She has no open wounds to this leg. 10/11; patient presents for follow-up. We have been doing Aquacel with Keystone antibiotic ointment under 3 layer compression to the left lower extremity. She developed a small blister to the anterior aspect of the left leg noticed when the wrap was taken off on intake. She currently denies signs of infection. 10/18; patient presents for follow-up. We have been doing Aquacel with Keystone antibiotic ointment under 3 layer compression to the left lower extremity. There has been continued improvement in wound healing. She denies signs of infection. 10/25; patient arrives for treatment of venous insufficiency ulcers on her left lower leg both lateral and medial are remanence of apparently a circumferential wound. Much improved. We are using topicals Keystone and Aquacel Ag under 3 layer compression we continue to make good progress. The patient talk to me at some length with regards to different things she has on her forehead and her Peri orbital area for which she is apparently applying [place]. She feels that what ever we are treating on her wounds is a more systemic problem. I really was not able to get a handle on what she is talking about however I did caution her not to put the [place] in her eyes. 11/1; her wounds continue to improve she is using Keystone and Aquacel Ag G under 3 layer compression. Our intake nurse notes erythema and edema in the right leg. The patient has a litany of concerns with regards to a rash on her forehead or ears and other systemic complaints. She has an appointment with dermatology on November 11 11/8; patient presents for follow-up. We have been using Keystone and Aquacel under 4-layer compression. She has no issues or complaints today. She reports improvement in wound healing. Electronic Signature(s) Signed: 10/18/2022 2:02:44 PM By: Kalman Shan DO Entered By: Kalman Shan on  10/18/2022 11:26:49 -------------------------------------------------------------------------------- Physical Exam Details Patient Name: Date of Service: Laura Santiago [name] 10/18/2022 9:45 A M Medical Record Number: [id number] Patient Account Number: [id number] Date of Birth/Sex: Treating RN: 1946/05/23 (76 y.o. Laura Santiago Primary Care Provider: [place] Other Clinician: [name] Referring Provider: Treating Provider/Extender: Kalman Shan [facility] Weeks in Treatment: 80 Constitutional . Cardiovascular . Psychiatric . Notes Left lower extremity: T the distal medial and lateral aspect of the leg there are open wounds with granulation tissue and nonviable tissue. No surrounding signs o of infection. Venous stasis dermatitis. Adequate edema control. Electronic Signature(s) Signed: 10/18/2022 2:02:44 PM By: Kalman Shan DO Entered By: Kalman Shan on 10/18/2022 11:26:55 Laura Santiago ([id number]) [id number] Page 6 of 13 -------------------------------------------------------------------------------- Physician Orders Details Patient Name: Date of Service: Laura Ace. 10/18/2022 9:45 A M Medical Record Number: [id number] Patient Account Number: [id number] Date of Birth/Sex: Treating RN: 13-Sep-1946 (76 y.o. Laura Santiago Primary Care Provider: [place] Other Clinician: [name] Referring Provider: Treating Provider/Extender: [name] Weeks in Treatment: 67 Verbal / Phone Orders: No Diagnosis Coding Follow-up Appointments Return Appointment in 1 week. Nurse Visit as needed [place]: - [place] for wound care. May utilize formulary equivalent dressing for wound treatment orders unless otherwise specified. Home Health Nurse may visit PRN to address patients wound care needs. **Please direct any NON-WOUND  related issues/requests for orders to patient's Primary Care Physician. **If current dressing causes regression in wound condition, may  D/C ordered dressing product/s and apply Normal Saline Moist Dressing daily until next [place] or Other MD appointment. **Notify Wound Healing Center of regression in wound condition at [phone]. Other Home Health Orders/Instructions: - Dressing change 3 x weekly, twice by home health and once at wound clinic weekly. PLEASE make sure frequency between dressing changes is appropriate. Bathing/ Shower/ Hygiene May shower with wound dressing protected with water repellent cover or cast protector. No tub bath. Anesthetic (Use 'Patient Medications' Section for Anesthetic Order Entry) Lidocaine applied to wound bed Edema Control - Lymphedema / Segmental Compressive Device / Other Optional: One layer of unna paste to top of compression wrap (to act as an anchor). - PLEASE when applying wrap start from toes and go up to just below knee 4 Layer Compression System Lymphedema. - left lower leg Tubigrip single layer applied. - Tubi D single right leg Elevate, Exercise Daily and A void Standing for Long Periods of Time. Elevate legs to the level of the heart and pump ankles as often as possible Elevate leg(s) parallel to the floor when sitting. DO YOUR BEST to sleep in the bed at night. DO NOT sleep in your recliner. Long hours of sitting in a recliner leads to swelling of the legs and/or potential wounds on your backside. Additional Orders / Instructions Follow Nutritious Diet and Increase Protein Intake Medications-Please add to medication list. Keystone Compound Wound Treatment Wound #1 - Lower Leg Wound Laterality: Left, Medial Cleanser: Wound Cleanser (Home Health) 3 x Per Week/30 Days Discharge Instructions: Wash your hands with soap and water. Remove old dressing, discard into plastic bag and place into trash. Cleanse the wound with Wound  Cleanser prior to applying a clean dressing using gauze sponges, not tissues or cotton balls. Do not scrub or use excessive force. Pat dry using gauze sponges, not tissue or cotton balls. Peri-Wound Care: Nystatin Cream USP 30 (g) 3 x Per Week/30 Days Discharge Instructions: Use Nystatin Cream as directed. MIx 1:1 with TCA cream and applied to peri wound Peri-Wound Care: Triamcinolone Acetonide Cream, 0.1%, 15 (g) tube 3 x Per Week/30 Days Discharge Instructions: Apply as directed. mix 1:1 with nystatin cream for peri wound Prim Dressing: Aquacel Extra Hydrofiber Dressing, 4x5 (in/in) ary 3 x Per Week/30 Days Prim Dressing: keystone compound 3 x Per Week/30 Days ary Discharge Instructions: apply to wound bed as directed on medication label PLEASE follow direction on pill bottle for proper mixing Laura Santiago, Laura Santiago ([id number]) [id number] Page 7 of 13 Secondary Dressing: Zetuvit Absorbent Pad, 4x8 (in/in) 3 x Per Week/30 Days Compression Wrap: Medichoice 4 layer Compression System, 35-40 mmHG 3 x Per Week/30 Days Discharge Instructions: Apply multi-layer wrap as directed. Electronic Signature(s) Signed: 10/18/2022 2:02:44 PM By: Kalman Shan DO Entered By: Kalman Shan on 10/18/2022 11:27:40 -------------------------------------------------------------------------------- Problem List Details Patient Name: Date of Service: Laura Santiago [name] 10/18/2022 9:45 A M Medical Record Number: [id number] Patient Account Number: [id number] Date of Birth/Sex: Treating RN: 1946/05/10 (76 y.o. 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/23/2022  No Yes 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 11/02/2021 No Yes I87.311 Chronic venous hypertension (idiopathic) with ulcer of right lower extremity 08/30/2022 No Yes I87.2 Venous insufficiency (  chronic) (peripheral) 07/06/2021 No Yes Z79.01 Long term (current) use of anticoagulants 07/06/2021 No Yes I10 Essential (primary) hypertension 07/06/2021 No Yes 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 07/06/2021 No Yes Inactive Problems ICD-10 Code Description Active Date Inactive Date S81.802A Unspecified open wound, left lower leg, initial encounter 07/06/2021 07/06/2021 MYKAYLAH, BALLMAN ([id number]) [id number] Page 8 of 13 S91.101A Unspecified open wound of right great toe without damage to nail, initial encounter 08/24/2021 08/24/2021 S91.104A Unspecified open wound of right lesser toe(s) without damage to nail, initial encounter 08/24/2021 08/24/2021 Resolved Problems ICD-10 Code Description Active Date Resolved Date S91.104D Unspecified open wound of right lesser toe(s) without damage to nail, subsequent 08/31/2021 08/31/2021 encounter S91.201D Unspecified open wound of right great toe with damage to nail, subsequent encounter 08/31/2021 08/31/2021 Electronic Signature(s) Signed: 10/18/2022 2:02:44 PM By: Kalman Shan DO Entered By: Kalman Shan on 10/18/2022 11:21:40 -------------------------------------------------------------------------------- Progress Note Details Patient Name: Date of Service: Laura Santiago [name] 10/18/2022 9:45 A M Medical Record Number: [id number] Patient Account Number: [id number] Date of Birth/Sex: Treating RN: Oct 14, 1946 (76 y.o. Laura Santiago Primary Care Provider: [place] Other Clinician: [name] Referring Provider: Treating Provider/Extender: [name] Weeks in Treatment: 32 Subjective Chief Complaint Information obtained from  Patient Left lower extremity wound Right toe wounds Left upper lateral thigh wounds History of Present Illness (HPI) Admission 7/27 Ms. Jeralynn Vaquera is a 76 year old female with a past medical history of ADHD, metastatic breast cancer, stage IV chronic kidney disease, history of DVT on Xarelto and chronic venous insufficiency that presents to the clinic for a chronic left lower extremity wound. She recently moved to [facility] 4 days ago. She was being followed by wound care center in [name]. She reports a 10-year history of wounds to her left lower extremity that eventually do heal with debridement and compression therapy. She states that the current wound reopened 4 months ago and she is using Vaseline and Coban. She denies signs of infection. 8/3; patient presents for 1 week follow-up. She reports no issues or complaints today. She states she had vascular studies done in the last week. She denies signs of infection. She brought her little service dog with her today. 8/17; patient presents for follow-up. She has missed her last clinic appointment. She states she took the wrap off and attempted to rewrap her leg. She is having difficulty with transportation. She has her service dog with her today. Overall she feels well and reports improvement in wound healing. She denies signs of infection. She reports owning an old Velcro wrap compression and has this at her living facility 9/14; patient presents for follow-up. Patient states that over the past 2 to 3 weeks she developed toe wounds to her right foot. She attributes this to tight fitting shoes. She subsequently developed cellulitis in the right leg and has been treated by doxycycline by her oncologist. She reports improvement in symptoms however continues to have some redness and swelling to this leg. T the left lower extremity patient has been having her wraps changed with home health twice weekly. She states that the [facility] is not helping control o the drainage. Other than that she has no issues or complaints today. She denies signs of infection to the left lower extremity. 9/21; patient presents for follow-up. She reports seeing infectious disease for her cellulitis. She reports no further management. She has home health that changes the wraps twice weekly. She has no issues or  complaints today. She denies signs of infection. 10/5; patient presents for follow-up. She has no issues or complaints today. She denies signs of infection. She states that the right great toe has not been dressed by home health. Laura Santiago, Laura Santiago ([id number]) [id number] Page 9 of 13 10/12; patient presents for follow-up. She has no issues or complaints today. She reports improvement in her wound healing. She has been using silver alginate to the right great toe wound. She denies signs of infection. 10/26; patient presents for follow-up. Home health did not have sorbact so they continued to use Hydrofera Blue under the wrap. She has been using silver alginate to the great toe wound however she did not have a dressing in place today. She currently denies signs of infection. 11/2; patient presents for follow-up. She has been using sorb act under the compression wrap. She reports using silver alginate to the toe wound again she does not have a dressing in place. She currently denies signs of infection. 11/23; patient presents for follow-up. Unfortunately she has missed her last 2 clinic appointments. She was last seen 3 weeks ago. She did her own compression wrap with Kerlix and Coban yesterday after seeing vein and vascular. She has not been dressing her right great toe wound. She currently denies signs of infection. 11/30; patient presents for 1 week follow-up. She states she changed her dressing last week prior to home health and use sorb act with Dakin's and Hydrofera Blue. Home health has changed the dressing  as well and they have been using sorbact. T oday she reports increased redness to her right lower extremity. She has a history of cellulitis to this leg. She has been using silver alginate to the right great toe. Unfortunately she had an episode of diarrhea prior to coming in and had feces all over the right leg and to the wrap of her left leg. 12/7; patient presents for 1 week follow-up. She states that home health did not come out to change the dressing and she took it off yesterday. It is unclear if she is dressing the right toe wound. She denies signs of infection. 12/14; patient presents for 1 week follow-up. She has no issues or complaints today. 12/21; patient presents for follow-up. She has no issues or complaints today. She denies signs of infection. 12/28/2021; patient presents for follow-up. She was hospitalized for sepsis secondary to right lower extremity cellulitis On 12/23. She states she is currently at a SNF. She states that she was started on doxycycline this morning for her right great toe swelling and redness. She is not sure what dressings have been done to her left lower extremity for the past 3 weeks. She says its been mainly gauze with an Ace wrap. 1/25; patient presents for follow-up. She is still residing in a skilled nursing facility. She reports mild pain to the left lower extremity wound bed. She states she is going to see a podiatrist soon. 2/8; patient presents for follow-up. She has moved back to her residential community from her skilled nursing facility. She has no issues or complaints today. She denies signs of systemic infections. 2/15; patient presents for follow-up. He has no issues or complaints today. She denies systemic signs of infection. 2/22; patient presents for follow-up. She has no issues or complaints today. She denies signs of infection. 3/1; patient presents for follow-up. She states that home health came out the day after she was seen in our clinic  and yesterday to do the wrap change.  She denies signs of infection. She reports excoriated skin on the ankle. 3/8; patient presents for follow-up. She has no issues or complaints today. She denies signs of infection. 3/15; patient presents for follow-up. Home health has been coming out to change the dressings. She reports more tenderness to the wound site. She denies purulent drainage, increased warmth or erythema to the area. 4/5; patient presents for follow-up. She has missed her last 2 clinic appointments. I have not seen her in 3 weeks. She was recently hospitalized for altered mental status. She was involuntarily committed. She was evaluated by psychiatry and deemed to have competency. There was no specific cause of her altered mental status. It was concluded that her physical and mental health were declining due to her chronic medical conditions. Currently home health has been coming out for dressing changes. Patient has also been doing her own dressing changes. She reports more skin breakdown to the periwound and now has a new wound. She denies fever/chills. She reports continued tenderness to the wound site. 4/12; patient with significant venous insufficiency and a large wound on her left lower leg taking up about 80% of the circumference of her lower leg. Cultures of this grew MRSA and Pseudomonas. She had completed a course of ciprofloxacin now is starting doxycycline. She has been using Dakin's wet-to-dry and a Tubigrip. She has home health twice a week and we change it once. 4/19; patient presents for follow-up. She completed her course of doxycycline. She has been using Dakin's wet-to-dry dressing and Tubigrip. Home health changes the dressing twice weekly. Currently she has no issues or complaints. 4/26; patient presents for follow-up. At last clinic visit orders for home health were Iodosorb under compression therapy. Unfortunately they did not have the dressing and have been using  Dakin's and gentamicin under the wrap. Patient currently denies signs of infection. She has no issues or complaints today. 5/3; patient presents for follow-up. Again Iodosorb has not been used under the compression therapy when home health comes out to change the wrap and dressing. They have been using Sorbact. It is unclear why this is happening since we send orders weekly to the agency. She denies signs of infection. Patient has not purchased the [place] antibiotics. We reached out to the company and they said they have been trying to contact her on a regular basis. We gave the patient the number to call to order the medication. 5/10; patient presents for follow-up. She has no issues or complaints today. Again home health has not been using Iodosorb. Mepilex was on the wound bed. No other dressings noted. She brought in her Keystone antibiotics. She denies signs of infection. 5/17; patient presents for follow-up. Home health has come out twice since she was last seen. Joint well she has been using Keystone antibiotic with Sorbact under the compression wrap. She has no issues or complaints today. She denies signs of infection. 5/24; patient presents for follow-up. We have been using Keystone antibiotics with Sorbact under compression therapy. She is tolerating the treatment well. She is reporting improvement in wound healing. She denies signs of infection. 5/31; patient presents for follow-up. We continue to do [facility] antibiotics with Sorbact under compression therapy. She continues to report improvement in wound healing. Home health comes out and changes the dressing once weekly. 05-17-2022 upon evaluation today patient appears to be doing better in regard to her wound especially compared to the last time I saw her. Fortunately I do think that she is seeing improvements. With  that being said I do believe that she may be benefit from sharp debridement today to clear away some of the necrotic debris  I discussed that with her as well. She is an amendable to that plan. Otherwise she is very pleased with how the [employer] is doing for her. 6/14; patient presents for follow-up. We have been using Keystone antibiotic with Sorbact and absorbent dressings under 3 layer compression. She has no issues or complaints today. She reports improvement in wound healing. She denies signs of infection. 6/21; patient presents for follow-up. We are continuing with [facility] antibiotic and Sorbact under 3 layer compression. Patient has no complaints. Continued wound healing is happening. She denies signs of infection. 6/28; patient presents for follow-up. We have been using Keystone antibiotic with Sorbact under 3 layer compression. Usually home health comes out and changes the dressing twice a week. Unfortunately they did not go out to change the dressing. It is unclear why. Patient did not call them. She currently denies Laura Santiago, Laura Santiago ([id number]) [id number] Page 10 of 13 signs of infection. 7/5; patient presents for follow-up. We have been using Keystone antibiotic with calcium alginate under 3 layer compression. She reports improvement in wound healing. She denies signs of infection. Home health has come out to do dressing changes twice this past week. 7/12; patient presents for follow-up. We have been using Keystone antibiotic with calcium alginate under 3 layer compression. Patient states that home health came out once last week to change the dressing. She reports improvement in wound healing. She currently denies signs of infection. 7/19; patient presents for follow-up. We have been using Keystone antibiotic with calcium alginate under 3 layer compression. Home health came out once last week to change the dressing. She has no issues or complaints today. She denies signs of infection. 8/2; patient presents for follow-up. We have been using Keystone antibiotic with calcium alginate  under 3 layer compression. Unfortunately she missed her appointment last week and home health did not come out to do dressing changes. Patient currently denies signs of infection. 8/9; patient presents for follow-up. We have been using Keystone with calcium alginate under 3 layer compression. She states that home health came out once last week. She currently denies signs of infection. Her wrap was completely wet. She states she was cleaning the top of the leg and water soaked down into the wrap. 8/16; patient presents for follow-up. We have been using Keystone with calcium alginate under 3 layer compression. She states that home health came out twice last week. She has no issues or complaints today. 8/23; patient presents for follow-up. He has been using Keystone with calcium alginate under 3 layer compression. Home health came out twice last week. She denies signs of infection. 8/30; patient presents for follow-up. We have been using Keystone with calcium alginate under 3 layer compression. Home health came out once last week to change the dressing. Patient reports improvement in wound healing. She states she is almost done with her chemotherapy infusions and has 1 more left. 9/13; patient presents for follow-up. She has lost the capsules to her [facility] antibiotic which I believe is the vancomycin pills. She has her Zosyn powder today. We have been using Keystone antibiotic ointment with calcium alginate under 3 layer compression. She is concerned about systemic infection however her vitals are stable and there is no surrounding soft tissue infection. She would like to remain a patient in our wound care center however would like a second opinion for her  wound care at another facility. She asked to be referred to [facility] wound care center. 9/20; patient presents for follow-up. She found her vancomycin capsules and brought in her complete Keystone antibiotic ointment set today. Unfortunately she has  developed skin breakdown and Erythema to the right lower extremity With increased swelling. She states she went to a pow wow Over the weekend and was on her feet for extended periods of time. She saw her oncologist yesterday who prescribed her doxycycline for her right lower extremity erythema. 9/27; patient presents for follow-up. We have been using Keystone antibiotic with Aquacel under 3 layer compression to the lower extremities bilaterally. When home health came and changed the wrap she secretly put coffee into the spray mix along with [facility] antibiotic on her leg thinking the acidic component would better activate the zoysn (sonething she discussed with her microbiologist brother). She has reported improvement in wound healing. 10/4; patient presents for follow-up. She has no issues or complaints today. We have been doing Aquacel and keystone under 3 layer compression to the lower extremities bilaterally. This morning she took the right lower extremity wrap off as it was uncomfortable. She has no open wounds to this leg. 10/11; patient presents for follow-up. We have been doing Aquacel with Keystone antibiotic ointment under 3 layer compression to the left lower extremity. She developed a small blister to the anterior aspect of the left leg noticed when the wrap was taken off on intake. She currently denies signs of infection. 10/18; patient presents for follow-up. We have been doing Aquacel with Keystone antibiotic ointment under 3 layer compression to the left lower extremity. There has been continued improvement in wound healing. She denies signs of infection. 10/25; patient arrives for treatment of venous insufficiency ulcers on her left lower leg both lateral and medial are remanence of apparently a circumferential wound. Much improved. We are using topicals Keystone and Aquacel Ag under 3 layer compression we continue to make good progress. The patient talk to me at some length with  regards to different things she has on her forehead and her Peri orbital area for which she is apparently applying [place]. She feels that what ever we are treating on her wounds is a more systemic problem. I really was not able to get a handle on what she is talking about however I did caution her not to put the [place] in her eyes. 11/1; her wounds continue to improve she is using Keystone and Aquacel Ag G under 3 layer compression. Our intake nurse notes erythema and edema in the right leg. The patient has a litany of concerns with regards to a rash on her forehead or ears and other systemic complaints. She has an appointment with dermatology on November 11 11/8; patient presents for follow-up. We have been using Keystone and Aquacel under 4-layer compression. She has no issues or complaints today. She reports improvement in wound healing. Objective Constitutional Vitals Time Taken: 10:12 AM, Height: 66 in, Weight: 153 lbs, BMI: 24.7, Temperature: 97.9 F, Pulse: 68 bpm, Respiratory Rate: 18 breaths/min, Blood Pressure: 125/72 mmHg. General Notes: Left lower extremity: T the distal medial and lateral aspect of the leg there are open wounds with granulation tissue and nonviable tissue. No o surrounding signs of infection. Venous stasis dermatitis. Adequate edema control. Integumentary (Hair, Skin) Wound #1 status is Open. Original cause of wound was Gradually Appeared. The date acquired was: 04/06/2021. The wound has been in treatment 67 weeks. The wound is located on the  Left,Medial Lower Leg. The wound measures 5.5cm length x 1.5cm width x 0.1cm depth; 6.48cm^2 area and 0.648cm^3 volume. There is Fat Layer (Subcutaneous Tissue) exposed. There is no tunneling or undermining noted. There is a medium amount of serosanguineous drainage noted. The wound margin is flat and intact. There is medium (34-66%) red, pink granulation within the wound bed. There is a medium (34-66%) amount of  necrotic tissue within the wound bed including Adherent Slough. Wound #8 status is Open. Original cause of wound was Pressure Injury. The date acquired was: 10/18/2022. The wound is located on the Left,Lateral Lower Leg. The wound measures 2.3cm length x 2.3cm width x 0.2cm depth; 4.155cm^2 area and 0.831cm^3 volume. There is Fat Layer (Subcutaneous Tissue) exposed. Laura Santiago, Laura Santiago ([id number]) [id number] Page 11 of 13 There is no tunneling or undermining noted. There is a medium amount of serosanguineous drainage noted. There is no granulation within the wound bed. There is a small (1-33%) amount of necrotic tissue within the wound bed including Adherent Slough. 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 I debrided nonviable tissue and recommended continuing the course with Keystone antibiotic and Aquacel. Her compression was increased at last clinic visit to 4-layer compression. She did well with this. We will continue this. 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 There was a Excisional Skin/Subcutaneous Tissue Debridement with a total area of 9 sq cm performed by Kalman Shan, MD. With the following instrument(s): Curette to remove Viable and Non-Viable tissue/material. Material removed includes Subcutaneous Tissue and Slough and. A time out was conducted at 10:40, prior to the start of the procedure. A Minimum  amount of bleeding was controlled with Pressure. The procedure was tolerated well. Santiago Debridement Measurements: 5.5cm length x 1.5cm width x 0.2cm depth; 1.296cm^3 volume. Character of Wound/Ulcer Santiago Debridement is stable. Severity of Tissue Santiago Debridement is: Fat layer exposed. Santiago procedure Diagnosis Wound #1: Same as Pre-Procedure Pre-procedure diagnosis of Wound #1 is a Venous Leg Ulcer located on the Left,Medial Lower Leg . There was a Four Layer Compression Therapy Procedure with a pre-treatment ABI of 1.5 by [name]. Santiago procedure Diagnosis Wound #1: Same as Pre-Procedure Wound #8 Pre-procedure diagnosis of Wound #8 is a Pressure Ulcer located on the Left,Lateral Lower Leg . There was a Excisional Skin/Subcutaneous Tissue Debridement with a total area of 5.29 sq cm performed by Kalman Shan, MD. With the following instrument(s): Curette to remove Viable and Non-Viable tissue/material. Material removed includes Subcutaneous Tissue and Slough and. A time out was conducted at 10:45, prior to the start of the procedure. A Minimum amount of bleeding was controlled with Pressure. The procedure was tolerated well. Santiago Debridement Measurements: 2.3cm length x 2.3cm width x 0.3cm depth; 1.246cm^3 volume. Santiago debridement Stage noted as Category/Stage II. Character of Wound/Ulcer Santiago Debridement is stable. Santiago procedure Diagnosis Wound #8: Same as Pre-Procedure Plan Follow-up Appointments: Return Appointment in 1 week. Nurse Visit as needed Home Health: [place]: - [place] for wound care. May utilize formulary equivalent dressing for wound treatment orders unless otherwise  specified. Home Health Nurse may visit [facility] to address patientoos wound care needs. **Please direct any NON-WOUND related issues/requests for orders to patient's Primary Care Physician. **If current dressing causes regression in wound condition, may D/C ordered dressing  product/s and apply Normal Saline Moist Dressing daily until next August or Other MD appointment. **Notify [facility] of regression in wound condition at [phone]. Other Home Health Orders/Instructions: - Dressing change 3 x weekly, twice by home health and once at wound clinic weekly. PLEASE make sure frequency between dressing changes is appropriate. Bathing/ Shower/ Hygiene: May shower with wound dressing protected with water repellent cover or cast protector. No tub bath. Anesthetic (Use 'Patient Medications' Section for Anesthetic Order Entry): Lidocaine applied to wound bed Edema Control - Lymphedema / Segmental Compressive Device / Other: Optional: One layer of unna paste to top of compression wrap (to act as an anchor). - PLEASE when applying wrap start from toes and go up to just below knee 4 Layer Compression System Lymphedema. - left lower leg Tubigrip single layer applied. - Tubi D single right leg Elevate, Exercise Daily and Avoid Standing for Long Periods of Time. Elevate legs to the level of the heart and pump ankles as often as possible Elevate leg(s) parallel to the floor when sitting. DO YOUR BEST to sleep in the bed at night. DO NOT sleep in your recliner. Long hours of sitting in a recliner leads to swelling of the legs and/or potential wounds on your backside. Additional Orders / Instructions: Laura Santiago, Laura Santiago ([id number]) [id number] Page 12 of 13 Follow Nutritious Diet and Increase Protein Intake Medications-Please add to medication list.: Keystone Compound WOUND #1: - Lower Leg Wound Laterality: Left, Medial Cleanser: Wound Cleanser (Home Health) 3 x Per Week/30 Days Discharge Instructions: Wash your hands with soap and water. Remove old dressing, discard into plastic bag and place into trash. Cleanse the wound with Wound Cleanser prior to applying a clean dressing using gauze sponges, not tissues or cotton  balls. Do not scrub or use excessive force. Pat dry using gauze sponges, not tissue or cotton balls. Peri-Wound Care: Nystatin Cream USP 30 (g) 3 x Per Week/30 Days Discharge Instructions: Use Nystatin Cream as directed. MIx 1:1 with TCA cream and applied to peri wound Peri-Wound Care: Triamcinolone Acetonide Cream, 0.1%, 15 (g) tube 3 x Per Week/30 Days Discharge Instructions: Apply as directed. mix 1:1 with nystatin cream for peri wound Prim Dressing: Aquacel Extra Hydrofiber Dressing, 4x5 (in/in) 3 x Per Week/30 Days ary Prim Dressing: keystone compound 3 x Per Week/30 Days ary Discharge Instructions: apply to wound bed as directed on medication label PLEASE follow direction on pill bottle for proper mixing Secondary Dressing: Zetuvit Absorbent Pad, 4x8 (in/in) 3 x Per Week/30 Days Com pression Wrap: Medichoice 4 layer Compression System, 35-40 mmHG 3 x Per Week/30 Days Discharge Instructions: Apply multi-layer wrap as directed. 1. In office sharp debridement 2. Aquacel with Keystone antibiotic under 4-layer compression Electronic Signature(s) Signed: 10/18/2022 2:02:44 PM By: Kalman Shan DO Entered By: Kalman Shan on 10/18/2022 11:27:14 -------------------------------------------------------------------------------- SuperBill Details Patient Name: Date of Service: Laura Santiago, [place]. 10/18/2022 Medical Record Number: [id number] Patient Account Number: [id number] Date of Birth/Sex: Treating RN: 1946/03/18 (76 y.o. 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 I87.311 Chronic venous hypertension  (idiopathic) with ulcer of right lower extremity I87.2 Venous insufficiency (chronic) (peripheral) Z79.01 Long term (current) use of anticoagulants I10 Essential (primary) hypertension 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 Facility Procedures : CPT4 Code: [id number] Description: 62229 - DEB SUBQ TISSUE 20 SQ CM/< ICD-10 Diagnosis Description 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 Modifier: sed Quantity: 1 Physician Procedures : CPT4 Code Description Modifier [id number] - WC PHYS SUBQ TISS 20 SQ CM VELVET, MOOMAW ([id number]) [id number] ICD-10 Diagnosis Description L97.822 Non-pressure chronic ulcer of other part of left lower leg with fat layer  exposed 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 Quantity: 1 df Page 13 of 13 Electronic Signature(s) Signed: 10/18/2022 2:02:44 PM By: Kalman Shan DO Entered By: Kalman Shan on 10/18/2022 11:27:30

## 2022-10-19 NOTE — Progress Notes (Signed)
Laura Santiago, Laura Santiago ([id number]) [id number] Page 1 of 11 Visit Report for 10/18/2022 Arrival Information Details Patient Name: Date of Service: Laura Ace. 10/18/2022 9:45 A M Medical Record Number: [id number] Patient Account Number: [id number] Date of Birth/Sex: Treating RN: [birth year] (76 y.o. Laura Santiago Primary Care [place] Prout: [place] Other Clinician: [name] Referring Nysha Koplin: Treating Inioluwa Boulay/Extender: [name] Weeks in Treatment: 1 Visit Information History Since Last Visit All ordered tests and consults were completed: No Patient Arrived: Laura Santiago Added or deleted any medications: No Arrival Time: 10:06 Any new allergies or adverse reactions: No Transfer Assistance: None Had a fall or experienced change in No Patient Requires Transmission-Based No activities of daily living that may affect Precautions: risk of falls: Patient Has Alerts: Yes Signs or symptoms of abuse/neglect since last visito No Patient Alerts: PT HAS SERVICE Hospitalized since last visit: No ANIMAL ABI 07/11/21 Implantable device outside of the clinic excluding No R) 1.16 L) 1.27 cellular tissue based products placed in the center since last visit: Pain Present Now: No Electronic Signature(s) Signed: 10/19/2022 12:52:59 PM By: [name] Entered By: [name] on 10/18/2022 10:11:50 -------------------------------------------------------------------------------- Clinic Level of Care Assessment Details Patient Name: Date of Service: Laura Ace. 10/18/2022 9:45 A M Medical Record Number: [id number] Patient Account Number: [id number] Date of Birth/Sex: Treating RN: 1946-02-06 (76 y.o. Laura Santiago Primary Care Atlee Villers: [place] Other Clinician: [name] Referring Finch Costanzo: Treating Brandilyn Nanninga/Extender: [name] Weeks in Treatment: 30 Clinic Level of Care  Assessment Items TOOL 1 Quantity Score _0  - 0 Use when EandM and Procedure is performed on INITIAL visit ASSESSMENTS - Nursing Assessment / Reassessment _1  - 0 General Physical Exam (combine w/ comprehensive assessment (listed just below) when performed on new pt. evals) _2  - 0 Comprehensive Assessment (HX, ROS, Risk Assessments, Wounds Hx, etc.) AMARIZ, FLAMENCO ([id number]) [id number] Page 2 of 11 ASSESSMENTS - Wound and Skin Assessment / Reassessment _3  - 0 Dermatologic / Skin Assessment (not related to wound area) ASSESSMENTS - Ostomy and/or Continence Assessment and Care _4  - 0 Incontinence Assessment and Management _5  - 0 Ostomy Care Assessment and Management (repouching, etc.) PROCESS - Coordination of Care _6  - 0 Simple Patient / Family Education for ongoing care _7  - 0 Complex (extensive) Patient / Family Education for ongoing care _8  - 0 Staff obtains Programmer, systems, Records, T Results / Process Orders est _9  - 0 Staff telephones HHA, Nursing Homes / Clarify orders / etc _10  - 0 Routine Transfer to another Facility (non-emergent condition) _11  - 0 Routine Hospital Admission (non-emergent condition) _12  - 0 New Admissions / Biomedical engineer / Ordering NPWT Apligraf, etc. , _13  - 0 Emergency Hospital Admission (emergent condition) PROCESS - Special Needs _14  - 0 Pediatric / Minor Patient Management _15  - 0 Isolation Patient Management _16  - 0 Hearing / Language / Visual special needs _17  - 0 Assessment of Community assistance (transportation, D/C planning, etc.) _18  - 0 Additional assistance / Altered mentation _19  - 0 Support Surface(s) Assessment (bed, cushion, seat, etc.) INTERVENTIONS - Miscellaneous _20  - 0 External ear exam _21  - 0 Patient Transfer (multiple staff / Civil Service fast streamer / Similar devices) _22  - 0 Simple Staple / Suture removal (25 or less) _23  - 0 Complex Staple / Suture removal (26 or more) _24  - 0 Hypo/Hyperglycemic  Management (do not check if billed separately) _25  - 0 Ankle / Brachial Index (ABI) - do not  check if billed separately Has the patient been seen at the hospital within the last three years: Yes Total Score: 0 Level Of Care: ____ Electronic Signature(s) Signed: 10/19/2022 12:52:59 PM By: [name] Entered By: [name] on 10/18/2022 11:11:51 -------------------------------------------------------------------------------- Compression Therapy Details Patient Name: Date of Service: Laura Jewett [name] 10/18/2022 9:45 A M Medical Record Number: [id number] Patient Account Number: [id number] Date of Birth/Sex: Treating RN: Sep 08, 1946 (76 y.o. Laura Santiago Primary Care Candice Tobey: [place] Other Clinician: [name] Referring Uriah Philipson: Treating Kalee Broxton/Extender: Kalman Shan [facility] Weeks in Treatment: [address] [name] M ([id number]) [id number] Page 3 of 11 Compression Therapy Performed for Wound Assessment: Wound #1 Left,Medial Lower Leg Performed By: Laura Santiago, Laura Santiago, Compression Type: Four Layer Pre Treatment ABI: 1.5 Post Procedure Diagnosis Same as Pre-procedure Electronic Signature(s) Signed: 10/19/2022 12:52:59 PM By: [name] Entered By: [name] on 10/18/2022 11:11:20 -------------------------------------------------------------------------------- Encounter Discharge Information Details Patient Name: Date of Service: Laura Jewett [name] 10/18/2022 9:45 A M Medical Record Number: [id number] Patient Account Number: [id number] Date of Birth/Sex: Treating RN: 1946/11/07 (76 y.o. Laura Santiago Primary Care Kyshaun Barnette: [place] Other Clinician: [name] Referring Michaelia Beilfuss: Treating Miquan Tandon/Extender: [name] Weeks in Treatment: 45 Encounter Discharge Information Items Post Procedure Vitals Discharge Condition: Stable Temperature (F):  97.9 Ambulatory Status: Walker Pulse (bpm): 68 Discharge Destination: Home Respiratory Rate (breaths/min): 18 Transportation: Other Blood Pressure (mmHg): 125/72 Accompanied By: self Schedule Follow-up Appointment: Yes Clinical Summary of Care: Electronic Signature(s) Signed: 10/19/2022 12:52:59 PM By: [name] Entered By: [name] on 10/18/2022 12:04:53 -------------------------------------------------------------------------------- Lower Extremity Assessment Details Patient Name: Date of Service: Laura Ace. 10/18/2022 9:45 A M Medical Record Number: [id number] Patient Account Number: [id number] Date of Birth/Sex: Treating RN: 1946/06/01 (76 y.o. Laura Santiago Primary Care Torria Fromer: [place] Other Clinician: [name] Referring Liliyana Thobe: Treating Dorotea Hand/Extender: Kalman Shan [facility] [place] in Treatment: [address] Edema Assessment L[LeftLEE-ANN, GAL ([id number])] [Right: [id number] Page 4 of 11] Assessed: [Left: Yes] [Right: No] Edema: [Left: Ye] [Right: s] Calf Left: Right: Point of Measurement: 30 cm From Medial Instep 37.6 cm Ankle Left: Right: Point of Measurement: 10 cm From Medial Instep 23 cm Vascular Assessment Pulses: Dorsalis Pedis Palpable: [Left:Yes] Electronic Signature(s) Signed: 10/18/2022 2:01:12 PM By: Gretta Cool, BSN, RN, CWS, [name] RN, BSN Signed: 10/19/2022 12:52:59 PM By: [name] Entered By: [name] on 10/18/2022 10:31:10 -------------------------------------------------------------------------------- Multi Wound Chart Details Patient Name: Date of Service: Laura Jewett [name] 10/18/2022 9:45 A M Medical Record Number: [id number] Patient Account Number: [id number] Date of Birth/Sex: Treating RN: 12-06-1946 (76 y.o. Laura Santiago Primary Care Baylea Milburn: [place] Other Clinician: [name] Referring Abie Cheek: Treating Kalicia Dufresne/Extender: [name] Weeks in Treatment: 62 Vital Signs Height(in): 66 Pulse(bpm): 68 Weight(lbs): 153 Blood Pressure(mmHg): 125/72 Body Mass Index(BMI): 24.7 Temperature(F): 97.9 Respiratory Rate(breaths/min): 18 [1:Photos:] [N/A:N/A] Left, Medial Lower Leg Left, Lateral Lower Leg N/A Wound Location: Gradually Appeared Pressure Injury N/A Wounding Event: Venous Leg Ulcer Pressure Ulcer N/A Primary Etiology: Hypertension, Osteoarthritis, ReceivedHypertension, Osteoarthritis, ReceivedN/A Comorbid History: Chemotherapy, Received Radiation Chemotherapy, Received Radiation 04/06/2021 10/18/2022 N/A Date Acquired: 26 0 N/A Weeks of Treatment: Open Open N/A Wound Status: No No N/A Wound Recurrence: Yes No N/A Clustered Wound: 5.5x1.5x0.1 2.3x2.3x0.2 N/A Measurements L x W x D (cm) Laura Santiago, Laura Santiago ([id number]) [id number] Page 5 of 11 6.48 4.155  N/A A (cm) : rea 0.648 0.831 N/A Volume (cm) : 92.80% N/A N/A % Reduction in Area: 96.40% N/A N/A % Reduction in Volume: Full Thickness Without Exposed Category/Stage II N/A Classification: Support Structures Medium Medium N/A Exudate Amount: Serosanguineous Serosanguineous N/A Exudate Type: red, brown red, brown N/A Exudate Color: Flat and Intact N/A N/A Wound Margin: Medium (34-66%) None Present (0%) N/A Granulation Amount: Red, Pink N/A N/A Granulation Quality: Medium (34-66%) Small (1-33%) N/A Necrotic Amount: Fat Layer (Subcutaneous Tissue): Yes Fat Layer (Subcutaneous Tissue): Yes N/A Exposed Structures: Fascia: No Fascia: No Tendon: No Tendon: No Muscle: No Muscle: No Joint: No Joint: No Bone: No Bone: No Small (1-33%) None N/A Epithelialization: Treatment Notes Electronic Signature(s) Signed: 10/19/2022 12:52:59 PM By: [name] Entered By: [name] on 10/18/2022  10:31:26 -------------------------------------------------------------------------------- Multi-Disciplinary Care Plan Details Patient Name: Date of Service: Laura Jewett [name] 10/18/2022 9:45 A M Medical Record Number: [id number] Patient Account Number: [id number] Date of Birth/Sex: Treating RN: 1946/09/19 (76 y.o. Laura Santiago Primary Care Moksha Dorgan: [place] Other Clinician: [name] Referring Melah Ebling: Treating Madalee Altmann/Extender: [name] Weeks in Treatment: 2 Active Inactive Soft Tissue Infection Nursing Diagnoses: Impaired tissue integrity Potential for infection: soft tissue Goals: Patient's soft tissue infection will resolve Date Initiated: 03/15/2022 Target Resolution Date: 04/27/2022 Goal Status: Active Signs and symptoms of infection will be recognized early to allow for prompt treatment Date Initiated: 03/15/2022 Date Inactivated: 04/26/2022 Target Resolution Date: 04/27/2022 Goal Status: Met Interventions: Assess signs and symptoms of infection every visit Treatment Activities: Culture and sensitivity : 03/15/2022 Notes: Wound/Skin Impairment Laura Santiago, Laura Santiago ([id number]) [id number] Page 6 of 11 Nursing Diagnoses: Impaired tissue integrity Goals: Patient/caregiver will verbalize understanding of skin care regimen Date Initiated: 07/06/2021 Date Inactivated: 07/27/2021 Target Resolution Date: 07/06/2021 Goal Status: Met Ulcer/skin breakdown will have a volume reduction of 30% by week 4 Date Initiated: 07/06/2021 Date Inactivated: 10/12/2021 Target Resolution Date: 08/06/2021 Goal Status: Unmet Unmet Reason: cont tx Ulcer/skin breakdown will have a volume reduction of 50% by week 8 Date Initiated: 07/06/2021 Target Resolution Date: 09/06/2021 Goal Status: Active Ulcer/skin breakdown will have a volume reduction of 80% by week 12 Date Initiated: 07/06/2021 Target Resolution Date:  10/06/2021 Goal Status: Active Ulcer/skin breakdown will heal within 14 weeks Date Initiated: 07/06/2021 Target Resolution Date: 11/06/2021 Goal Status: Active Interventions: Assess patient/caregiver ability to obtain necessary supplies Assess patient/caregiver ability to perform ulcer/skin care regimen upon admission and as needed Assess ulceration(s) every visit Treatment Activities: Referred to DME Keevin Panebianco for dressing supplies : 07/06/2021 Skin care regimen initiated : 07/06/2021 Notes: Electronic Signature(s) Signed: 10/18/2022 2:01:12 PM By: Gretta Cool, BSN, RN, CWS, [name] RN, BSN Signed: 10/19/2022 12:52:59 PM By: [name] Entered By: [name] on 10/18/2022 10:31:18 -------------------------------------------------------------------------------- Pain Assessment Details Patient Name: Date of Service: Laura Jewett [name] 10/18/2022 9:45 A M Medical Record Number: [id number] Patient Account Number: [id number] Date of Birth/Sex: Treating RN: 11/04/1946 (76 y.o. Laura Santiago Primary Care [name] [name]: [place] Other Clinician: [name] Referring Imunique Samad: Treating Kara Melching/Extender: Kalman Shan [facility] [place] in Treatment: 36 Active Problems Location of Pain Severity and Description of Pain Patient Has Paino No Site Locations Laura Santiago, Laura Santiago [place] ([id number]) [id number] Page 7 of 11 Pain Management and Medication Current Pain Management: Electronic Signature(s) Signed: 10/18/2022 2:01:12 PM By: Gretta Cool, BSN, RN, CWS, [name] RN, BSN Signed: 10/19/2022 12:52:59 PM By: [name] Entered By: [name] on 10/18/2022 10:15:17 -------------------------------------------------------------------------------- Patient/Caregiver  Education Details Patient Name: Date of Service: Laura Ace. 11/8/2023andnbsp9:45 A M Medical Record Number: [id number] Patient Account Number: [id number] Date of Birth/Gender: Treating  RN: [birth year] (76 y.o. Laura Santiago Primary Care Physician: [place] Other Clinician: [name] Referring Physician: Treating Physician/Extender: Hoffman, [place] Weeks in Treatment: 59 Education Assessment Education Provided To: Patient Education Topics Provided Wound/Skin Impairment: Handouts: Other: continue wound care as directed Methods: Explain/Verbal Responses: State content correctly Electronic Signature(s) Signed: 10/19/2022 12:52:59 PM By: [name] Entered By: [name] on 10/18/2022 11:12:13 Laura Santiago ([id number]) [id number] Page 8 of 11 -------------------------------------------------------------------------------- Wound Assessment Details Patient Name: Date of Service: Laura Ace. 10/18/2022 9:45 A M Medical Record Number: [id number] Patient Account Number: [id number] Date of Birth/Sex: Treating RN: 21-Nov-1946 (76 y.o. Laura Santiago Primary Care Max Nuno: [place] Other Clinician: [name] Referring Zonie Crutcher: Treating Torben Soloway/Extender: [name] Weeks in Treatment: 41 Wound Status Wound Number: 1 Primary Venous Leg Ulcer Etiology: Wound Location: Left, Medial Lower Leg Wound Status: Open Wounding Event: Gradually Appeared Comorbid Hypertension, Osteoarthritis, Received Chemotherapy, Date Acquired: 04/06/2021 History: Received Radiation Weeks Of Treatment: 67 Clustered Wound: Yes Photos Wound Measurements Length: (cm) 5.5 Width: (cm) 1.5 Depth: (cm) 0.1 Area: (cm) 6.48 Volume: (cm) 0.648 % Reduction in Area: 92.8% % Reduction in Volume: 96.4% Epithelialization: Small (1-33%) Tunneling: No Undermining: No Wound Description Classification: Full Thickness Without Exposed Support Structures Wound Margin: Flat and Intact Exudate Amount: Medium Exudate Type: Serosanguineous Exudate Color: red, brown Foul Odor  After Cleansing: No Slough/Fibrino Yes Wound Bed Granulation Amount: Medium (34-66%) Exposed Structure Granulation Quality: Red, Pink Fascia Exposed: No Necrotic Amount: Medium (34-66%) Fat Layer (Subcutaneous Tissue) Exposed: Yes Necrotic Quality: Adherent Slough Tendon Exposed: No Muscle Exposed: No Joint Exposed: No Bone Exposed: No Treatment Notes Wound #1 (Lower Leg) Wound Laterality: Left, Medial Cleanser Wound Cleanser Discharge Instruction: Wash your hands with soap and water. Remove old dressing, discard into plastic bag and place into trash. Cleanse the wound with Wound Cleanser prior to applying a clean dressing using gauze sponges, not tissues or cotton balls. Do not scrub or use excessive Laura Santiago, Laura Santiago ([id number]) [id number] Page 9 of 11 force. Pat dry using gauze sponges, not tissue or cotton balls. Peri-Wound Care Nystatin Cream USP 30 (g) Discharge Instruction: Use Nystatin Cream as directed. MIx 1:1 with TCA cream and applied to peri wound Triamcinolone Acetonide Cream, 0.1%, 15 (g) tube Discharge Instruction: Apply as directed. mix 1:1 with nystatin cream for peri wound Topical Primary Dressing Aquacel Extra Hydrofiber Dressing, 4x5 (in/in) keystone compound Discharge Instruction: apply to wound bed as directed on medication label PLEASE follow direction on pill bottle for proper mixing Secondary Dressing Zetuvit Absorbent Pad, 4x8 (in/in) Secured With Compression Wrap Medichoice 4 layer Compression System, 35-40 mmHG Discharge Instruction: Apply multi-layer wrap as directed. Compression Stockings Add-Ons Electronic Signature(s) Signed: 10/18/2022 2:01:12 PM By: Gretta Cool, BSN, RN, CWS, [name] RN, BSN Signed: 10/19/2022 12:52:59 PM By: [name] Entered By: [name] on 10/18/2022 10:27:16 -------------------------------------------------------------------------------- Wound Assessment Details Patient Name: Date of  Service: Laura Jewett [name] 10/18/2022 9:45 A M Medical Record Number: [id number] Patient Account Number: [id number] Date of Birth/Sex: Treating RN: 23-Sep-1946 (76 y.o. Laura Santiago Primary Care Jourdyn Hasler: [place] Other Clinician: [name] Referring Alexy Bringle: Treating Vernis Eid/Extender: Kalman Shan [facility] Weeks in Treatment: 87 Wound Status Wound Number: 8 Primary Pressure  Ulcer Etiology: Wound Location: Left, Lateral Lower Leg Wound Status: Open Wounding Event: Pressure Injury Comorbid Hypertension, Osteoarthritis, Received Chemotherapy, Date Acquired: 10/18/2022 History: Received Radiation Weeks Of Treatment: 0 Clustered Wound: No Photos Laura Santiago, Laura Santiago ([id number]) [id number] Page 10 of 11 Wound Measurements Length: (cm) 2.3 Width: (cm) 2.3 Depth: (cm) 0.2 Area: (cm) 4.155 Volume: (cm) 0.831 % Reduction in Area: % Reduction in Volume: Epithelialization: None Tunneling: No Undermining: No Wound Description Classification: Category/Stage II Exudate Amount: Medium Exudate Type: Serosanguineous Exudate Color: red, brown Foul Odor After Cleansing: No Slough/Fibrino Yes Wound Bed Granulation Amount: None Present (0%) Exposed Structure Necrotic Amount: Small (1-33%) Fascia Exposed: No Necrotic Quality: Adherent Slough Fat Layer (Subcutaneous Tissue) Exposed: Yes Tendon Exposed: No Muscle Exposed: No Joint Exposed: No Bone Exposed: No Treatment Notes Wound #8 (Lower Leg) Wound Laterality: Left, Lateral Cleanser Peri-Wound Care Topical Primary Dressing Secondary Dressing Secured With Compression Wrap Compression Stockings Add-Ons Electronic Signature(s) Signed: 10/18/2022 2:01:12 PM By: Gretta Cool, BSN, RN, CWS, [name] RN, BSN Signed: 10/19/2022 12:52:59 PM By: [name] Entered By: [name] on 10/18/2022 10:30:14 Vitals  Details -------------------------------------------------------------------------------- Laura Santiago ([id number]) [id number] Page 11 of 11 Patient Name: Date of Service: Laura Ace. 10/18/2022 9:45 A M Medical Record Number: [id number] Patient Account Number: [id number] Date of Birth/Sex: Treating RN: [birth year] (76 y.o. Laura Santiago Primary Care Debralee Braaksma: [place] Other Clinician: [name] Referring Miguelangel Korn: Treating Manuel Lawhead/Extender: [name] Weeks in Treatment: 36 Vital Signs Time Taken: 10:12 Temperature (F): 97.9 Height (in): 66 Pulse (bpm): 68 Weight (lbs): 153 Respiratory Rate (breaths/min): 18 Body Mass Index (BMI): 24.7 Blood Pressure (mmHg): 125/72 Reference Range: 80 - 120 mg / dl Electronic Signature(s) Signed: 10/19/2022 12:52:59 PM By: [name] Entered By: [name] on 10/18/2022 10:15:13

## 2022-10-25 ENCOUNTER — Other Ambulatory Visit: Payer: Self-pay | Admitting: Oncology

## 2022-10-25 ENCOUNTER — Ambulatory Visit
Admission: RE | Admit: 2022-10-25 | Discharge: 2022-10-25 | Disposition: A | Discharge status: Home / Self Care | Payer: Medicare Other | Source: Ambulatory Visit | Attending: Oncology | Admitting: Oncology

## 2022-10-25 ENCOUNTER — Other Ambulatory Visit: Payer: Self-pay | Admitting: Nurse Practitioner

## 2022-10-25 ENCOUNTER — Encounter (HOSPITAL_BASED_OUTPATIENT_CLINIC_OR_DEPARTMENT_OTHER): Payer: Medicare Other | Admitting: Internal Medicine

## 2022-10-25 DIAGNOSIS — R601 Generalized edema: Secondary | ICD-10-CM | POA: Diagnosis not present

## 2022-10-25 DIAGNOSIS — L03115 Cellulitis of right lower limb: Secondary | ICD-10-CM | POA: Insufficient documentation

## 2022-10-25 DIAGNOSIS — Z7189 Other specified counseling: Secondary | ICD-10-CM

## 2022-10-25 DIAGNOSIS — I340 Nonrheumatic mitral (valve) insufficiency: Secondary | ICD-10-CM | POA: Insufficient documentation

## 2022-10-25 DIAGNOSIS — I10 Essential (primary) hypertension: Secondary | ICD-10-CM | POA: Insufficient documentation

## 2022-10-25 DIAGNOSIS — R609 Edema, unspecified: Secondary | ICD-10-CM | POA: Insufficient documentation

## 2022-10-25 DIAGNOSIS — L97822 Non-pressure chronic ulcer of other part of left lower leg with fat layer exposed: Secondary | ICD-10-CM

## 2022-10-25 DIAGNOSIS — I87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: Secondary | ICD-10-CM

## 2022-10-25 DIAGNOSIS — I87313 Chronic venous hypertension (idiopathic) with ulcer of bilateral lower extremity: Secondary | ICD-10-CM | POA: Diagnosis not present

## 2022-10-25 LAB — ECHOCARDIOGRAM COMPLETE
AR max vel: 1.46 cm2
AV Area VTI: 1.64 cm2
AV Area mean vel: 1.49 cm2
AV Mean grad: 6 mmHg
AV Peak grad: 11.4 mmHg
Ao pk vel: 1.69 m/s
Area-P 1/2: 3.61 cm2
Calc EF: 41.5 %
S' Lateral: 3.7 cm
Single Plane A2C EF: 43.4 %
Single Plane A4C EF: 40.4 %

## 2022-10-25 MED ORDER — AMPHETAMINE-DEXTROAMPHET ER 30 MG PO CP24: 30.0000 mg | ORAL_CAPSULE | Freq: Every day | ORAL | 0 refills | Status: DC

## 2022-10-25 MED ORDER — OXYCODONE HCL 10 MG PO TABS: 15.0000 mg | ORAL_TABLET | Freq: Four times a day (QID) | ORAL | 0 refills | Status: DC | PRN

## 2022-10-25 MED ORDER — FENTANYL 25 MCG/HR TD PT72: MEDICATED_PATCH | TRANSDERMAL | 0 refills | Status: DC

## 2022-10-25 NOTE — Progress Notes (Signed)
*  PRELIMINARY RESULTS* Echocardiogram 2D Echocardiogram has been performed.  Laura Santiago 10/25/2022, 11:52 AM

## 2022-10-27 NOTE — Progress Notes (Signed)
OCTA, UPLINGER ([id number]) [id number] Page 1 of 13 Visit Report for 10/25/2022 Chief Complaint Document Details Patient Name: Date of Service: Laura Santiago. 10/25/2022 9:45 A M Medical Record Number: [id number] Patient Account Number: [id number] Date of Birth/Sex: Treating RN: September 24, 1946 (76 y.o. [name] Primary Care Provider: [place] Other Clinician: [name] Referring Provider: Treating Provider/Extender: [name] Weeks in Treatment: 23 Information Obtained from: Patient Chief Complaint Left lower extremity wound Right toe wounds Left upper lateral thigh wounds Electronic Signature(s) Signed: 10/25/2022 2:25:25 PM By: Kalman Shan DO Entered By: Kalman Shan on 10/25/2022 11:37:24 -------------------------------------------------------------------------------- Debridement Details Patient Name: Date of Service: Laura Jewett [name] 10/25/2022 9:45 A M Medical Record Number: [id number] Patient Account Number: [id number] Date of Birth/Sex: Treating RN: 1946-02-01 (76 y.o. [name] Primary Care Provider: [place] Other Clinician: [name] Referring Provider: Treating Provider/Extender: [name] Weeks in Treatment: 68 Debridement Performed for Assessment: Wound #8 Left,Lateral Lower Leg Performed By: Physician Kalman Shan, MD Debridement Type: Debridement Level of Consciousness (Pre-procedure): Awake and Alert Pre-procedure Verification/Time Out Yes - 10:34 Taken: Start Time: 10:34 T Area Debrided (L x W): otal 1.8 (cm) x 2.3 (cm) = 4.14 (cm) Tissue and other material debrided: Viable, Non-Viable, Slough, Subcutaneous, Slough Level: Skin/Subcutaneous Tissue Debridement Description: Excisional Instrument: Curette Bleeding: Minimum Hemostasis Achieved: Pressure Response to Treatment: Procedure was tolerated  well Level of Consciousness (Post- Awake and Alert procedure): LODA, BIALAS ([id number]) [id number] Page 2 of 13 Post Debridement Measurements of Total Wound Length: (cm) 1.8 Stage: Category/Stage II Width: (cm) 2.3 Depth: (cm) 0.2 Volume: (cm) 0.65 Character of Wound/Ulcer Post Debridement: Stable Post Procedure Diagnosis Same as Pre-procedure Electronic Signature(s) Signed: 10/25/2022 2:25:25 PM By: Kalman Shan DO Signed: 10/26/2022 5:06:55 PM By: [name] Signed: 10/27/2022 1:14:49 PM By: Carlene Coria RN Entered By: [name] on 10/25/2022 10:36:13 -------------------------------------------------------------------------------- Debridement Details Patient Name: Date of Service: Laura Jewett [name] 10/25/2022 9:45 A M Medical Record Number: [id number] Patient Account Number: [id number] Date of Birth/Sex: Treating RN: 06/30/1946 (76 y.o. [name] Primary Care Provider: [place] Other Clinician: [name] Referring Provider: Treating Provider/Extender: [name] Weeks in Treatment: 68 Debridement Performed for Assessment: Wound #1 Left,Medial Lower Leg Performed By: Physician Kalman Shan, MD Debridement Type: Debridement Severity of Tissue Pre Debridement: Fat layer exposed Level of Consciousness (Pre-procedure): Awake and Alert Pre-procedure Verification/Time Out Yes - 10:36 Taken: Start Time: 10:36 T Area Debrided (L x W): otal 2.5 (cm) x 1 (cm) = 2.5 (cm) Tissue and other material debrided: Viable, Non-Viable, Slough, Subcutaneous, Slough Level: Skin/Subcutaneous Tissue Debridement Description: Excisional Instrument: Curette Bleeding: Minimum Hemostasis Achieved: Pressure Response to Treatment: Procedure was tolerated well Level of Consciousness (Post- Awake and Alert procedure): Post Debridement Measurements of Total Wound Length: (cm) 4.5 Width: (cm)  0.9 Depth: (cm) 0.1 Volume: (cm) 0.318 Character of Wound/Ulcer Post Debridement: Stable Severity of Tissue Post Debridement: Fat layer exposed Post Procedure Diagnosis Same as Pre-procedure Electronic Signature(s) Signed: 10/25/2022 2:25:25 PM By: Kalman Shan DO Signed: 10/26/2022 5:06:55 PM By: [name] Signed: 10/27/2022 1:14:49 PM By: Carlene Coria RN Entered By: [name] on 10/25/2022 10:39:09 Laura Santiago ([id number]) [id number] Page 3 of 13 -------------------------------------------------------------------------------- HPI Details Patient Name: Date of Service: Laura Santiago. 10/25/2022 9:45 A M Medical Record Number: [id number] Patient Account Number: [id number] Date of Birth/Sex: Treating  RN: September 07, 1946 (76 y.o. [name] Primary Care Provider: [place] Other Clinician: [name] Referring Provider: Treating Provider/Extender: Kalman Shan [facility] Weeks in Treatment: 65 History of Present Illness HPI Description: Admission 7/27 Ms. Kasumi Ditullio is a 76 year old female with a past medical history of ADHD, metastatic breast cancer, stage IV chronic kidney disease, history of DVT on Xarelto and chronic venous insufficiency that presents to the clinic for a chronic left lower extremity wound. She recently moved to [facility] 4 days ago. She was being followed by wound care center in [name]. She reports a 10-year history of wounds to her left lower extremity that eventually do heal with debridement and compression therapy. She states that the current wound reopened 4 months ago and she is using Vaseline and Coban. She denies signs of infection. 8/3; patient presents for 1 week follow-up. She reports no issues or complaints today. She states she had vascular studies done in the last week. She denies signs of infection. She brought her little service dog with her  today. 8/17; patient presents for follow-up. She has missed her last clinic appointment. She states she took the wrap off and attempted to rewrap her leg. She is having difficulty with transportation. She has her service dog with her today. Overall she feels well and reports improvement in wound healing. She denies signs of infection. She reports owning an old Velcro wrap compression and has this at her living facility 9/14; patient presents for follow-up. Patient states that over the past 2 to 3 weeks she developed toe wounds to her right foot. She attributes this to tight fitting shoes. She subsequently developed cellulitis in the right leg and has been treated by doxycycline by her oncologist. She reports improvement in symptoms however continues to have some redness and swelling to this leg. T the left lower extremity patient has been having her wraps changed with home health twice weekly. She states that the [facility] is not helping control o the drainage. Other than that she has no issues or complaints today. She denies signs of infection to the left lower extremity. 9/21; patient presents for follow-up. She reports seeing infectious disease for her cellulitis. She reports no further management. She has home health that changes the wraps twice weekly. She has no issues or complaints today. She denies signs of infection. 10/5; patient presents for follow-up. She has no issues or complaints today. She denies signs of infection. She states that the right great toe has not been dressed by home health. 10/12; patient presents for follow-up. She has no issues or complaints today. She reports improvement in her wound healing. She has been using silver alginate to the right great toe wound. She denies signs of infection. 10/26; patient presents for follow-up. Home health did not have sorbact so they continued to use Hydrofera Blue under the wrap. She has been using silver alginate to the great  toe wound however she did not have a dressing in place today. She currently denies signs of infection. 11/2; patient presents for follow-up. She has been using sorb act under the compression wrap. She reports using silver alginate to the toe wound again she does not have a dressing in place. She currently denies signs of infection. 11/23; patient presents for follow-up. Unfortunately she has missed her last 2 clinic appointments. She was last seen 3 weeks ago. She did her own compression wrap with Kerlix and Coban yesterday after seeing vein and vascular.  She has not been dressing her right great toe wound. She currently denies signs of infection. 11/30; patient presents for 1 week follow-up. She states she changed her dressing last week prior to home health and use sorb act with Dakin's and Hydrofera Blue. Home health has changed the dressing as well and they have been using sorbact. T oday she reports increased redness to her right lower extremity. She has a history of cellulitis to this leg. She has been using silver alginate to the right great toe. Unfortunately she had an episode of diarrhea prior to coming in and had feces all over the right leg and to the wrap of her left leg. 12/7; patient presents for 1 week follow-up. She states that home health did not come out to change the dressing and she took it off yesterday. It is unclear if she is dressing the right toe wound. She denies signs of infection. 12/14; patient presents for 1 week follow-up. She has no issues or complaints today. 12/21; patient presents for follow-up. She has no issues or complaints today. She denies signs of infection. 12/28/2021; patient presents for follow-up. She was hospitalized for sepsis secondary to right lower extremity cellulitis On 12/23. She states she is currently at a SNF. She states that she was started on doxycycline this morning for her right great toe swelling and redness. She is not sure what dressings  have been done to her left lower extremity for the past 3 weeks. She says its been mainly gauze with an Santiago wrap. 1/25; patient presents for follow-up. She is still residing in a skilled nursing facility. She reports mild pain to the left lower extremity wound bed. She states she is going to see a podiatrist soon. 2/8; patient presents for follow-up. She has moved back to her residential community from her skilled nursing facility. She has no issues or complaints today. She denies signs of systemic infections. TRINITA, DEVLIN ([id number]) [id number] Page 4 of 13 2/15; patient presents for follow-up. He has no issues or complaints today. She denies systemic signs of infection. 2/22; patient presents for follow-up. She has no issues or complaints today. She denies signs of infection. 3/1; patient presents for follow-up. She states that home health came out the day after she was seen in our clinic and yesterday to do the wrap change. She denies signs of infection. She reports excoriated skin on the ankle. 3/8; patient presents for follow-up. She has no issues or complaints today. She denies signs of infection. 3/15; patient presents for follow-up. Home health has been coming out to change the dressings. She reports more tenderness to the wound site. She denies purulent drainage, increased warmth or erythema to the area. 4/5; patient presents for follow-up. She has missed her last 2 clinic appointments. I have not seen her in 3 weeks. She was recently hospitalized for altered mental status. She was involuntarily committed. She was evaluated by psychiatry and deemed to have competency. There was no specific cause of her altered mental status. It was concluded that her physical and mental health were declining due to her chronic medical conditions. Currently home health has been coming out for dressing changes. Patient has also been doing her own dressing changes. She reports  more skin breakdown to the periwound and now has a new wound. She denies fever/chills. She reports continued tenderness to the wound site. 4/12; patient with significant venous insufficiency and a large wound on her left lower leg taking up about 80% of the  circumference of her lower leg. Cultures of this grew MRSA and Pseudomonas. She had completed a course of ciprofloxacin now is starting doxycycline. She has been using Dakin's wet-to-dry and a Tubigrip. She has home health twice a week and we change it once. 4/19; patient presents for follow-up. She completed her course of doxycycline. She has been using Dakin's wet-to-dry dressing and Tubigrip. Home health changes the dressing twice weekly. Currently she has no issues or complaints. 4/26; patient presents for follow-up. At last clinic visit orders for home health were Iodosorb under compression therapy. Unfortunately they did not have the dressing and have been using Dakin's and gentamicin under the wrap. Patient currently denies signs of infection. She has no issues or complaints today. 5/3; patient presents for follow-up. Again Iodosorb has not been used under the compression therapy when home health comes out to change the wrap and dressing. They have been using Sorbact. It is unclear why this is happening since we send orders weekly to the agency. She denies signs of infection. Patient has not purchased the [place] antibiotics. We reached out to the company and they said they have been trying to contact her on a regular basis. We gave the patient the number to call to order the medication. 5/10; patient presents for follow-up. She has no issues or complaints today. Again home health has not been using Iodosorb. Mepilex was on the wound bed. No other dressings noted. She brought in her Keystone antibiotics. She denies signs of infection. 5/17; patient presents for follow-up. Home health has come out twice since she was last seen. Joint well  she has been using Keystone antibiotic with Sorbact under the compression wrap. She has no issues or complaints today. She denies signs of infection. 5/24; patient presents for follow-up. We have been using Keystone antibiotics with Sorbact under compression therapy. She is tolerating the treatment well. She is reporting improvement in wound healing. She denies signs of infection. 5/31; patient presents for follow-up. We continue to do [facility] antibiotics with Sorbact under compression therapy. She continues to report improvement in wound healing. Home health comes out and changes the dressing once weekly. 05-17-2022 upon evaluation today patient appears to be doing better in regard to her wound especially compared to the last time I saw her. Fortunately I do think that she is seeing improvements. With that being said I do believe that she may be benefit from sharp debridement today to clear away some of the necrotic debris I discussed that with her as well. She is an amendable to that plan. Otherwise she is very pleased with how the [employer] is doing for her. 6/14; patient presents for follow-up. We have been using Keystone antibiotic with Sorbact and absorbent dressings under 3 layer compression. She has no issues or complaints today. She reports improvement in wound healing. She denies signs of infection. 6/21; patient presents for follow-up. We are continuing with [facility] antibiotic and Sorbact under 3 layer compression. Patient has no complaints. Continued wound healing is happening. She denies signs of infection. 6/28; patient presents for follow-up. We have been using Keystone antibiotic with Sorbact under 3 layer compression. Usually home health comes out and changes the dressing twice a week. Unfortunately they did not go out to change the dressing. It is unclear why. Patient did not call them. She currently denies signs of infection. 7/5; patient presents for follow-up. We have been using  Keystone antibiotic with calcium alginate under 3 layer compression. She reports improvement in wound healing.  She denies signs of infection. Home health has come out to do dressing changes twice this past week. 7/12; patient presents for follow-up. We have been using Keystone antibiotic with calcium alginate under 3 layer compression. Patient states that home health came out once last week to change the dressing. She reports improvement in wound healing. She currently denies signs of infection. 7/19; patient presents for follow-up. We have been using Keystone antibiotic with calcium alginate under 3 layer compression. Home health came out once last week to change the dressing. She has no issues or complaints today. She denies signs of infection. 8/2; patient presents for follow-up. We have been using Keystone antibiotic with calcium alginate under 3 layer compression. Unfortunately she missed her appointment last week and home health did not come out to do dressing changes. Patient currently denies signs of infection. 8/9; patient presents for follow-up. We have been using Keystone with calcium alginate under 3 layer compression. She states that home health came out once last week. She currently denies signs of infection. Her wrap was completely wet. She states she was cleaning the top of the leg and water soaked down into the wrap. 8/16; patient presents for follow-up. We have been using Keystone with calcium alginate under 3 layer compression. She states that home health came out twice last week. She has no issues or complaints today. 8/23; patient presents for follow-up. He has been using Keystone with calcium alginate under 3 layer compression. Home health came out twice last week. She denies signs of infection. 8/30; patient presents for follow-up. We have been using Keystone with calcium alginate under 3 layer compression. Home health came out once last week to change the dressing. Patient  reports improvement in wound healing. She states she is almost done with her chemotherapy infusions and has 1 more left. 9/13; patient presents for follow-up. She has lost the capsules to her [facility] antibiotic which I believe is the vancomycin pills. She has her Zosyn powder today. We have been using Keystone antibiotic ointment with calcium alginate under 3 layer compression. She is concerned about systemic infection however her vitals are stable and there is no surrounding soft tissue infection. She would like to remain a patient in our wound care center however would like a second opinion for her wound care at another facility. She asked to be referred to [facility] wound care center. 9/20; patient presents for follow-up. She found her vancomycin capsules and brought in her complete Keystone antibiotic ointment set today. Unfortunately she has developed skin breakdown and Erythema to the right lower extremity With increased swelling. She states she went to a [employer] Over the weekend CALEEN, TAAFFE ([id number]) [id number] Page 5 of 13 and was on her feet for extended periods of time. She saw her oncologist yesterday who prescribed her doxycycline for her right lower extremity erythema. 9/27; patient presents for follow-up. We have been using Keystone antibiotic with Aquacel under 3 layer compression to the lower extremities bilaterally. When home health came and changed the wrap she secretly put coffee into the spray mix along with [facility] antibiotic on her leg thinking the acidic component would better activate the zoysn (sonething she discussed with her microbiologist brother). She has reported improvement in wound healing. 10/4; patient presents for follow-up. She has no issues or complaints today. We have been doing Aquacel and keystone under 3 layer compression to the lower extremities bilaterally. This morning she took the right lower extremity wrap off as it was  uncomfortable.  She has no open wounds to this leg. 10/11; patient presents for follow-up. We have been doing Aquacel with Keystone antibiotic ointment under 3 layer compression to the left lower extremity. She developed a small blister to the anterior aspect of the left leg noticed when the wrap was taken off on intake. She currently denies signs of infection. 10/18; patient presents for follow-up. We have been doing Aquacel with Keystone antibiotic ointment under 3 layer compression to the left lower extremity. There has been continued improvement in wound healing. She denies signs of infection. 10/25; patient arrives for treatment of venous insufficiency ulcers on her left lower leg both lateral and medial are remanence of apparently a circumferential wound. Much improved. We are using topicals Keystone and Aquacel Ag under 3 layer compression we continue to make good progress. The patient talk to me at some length with regards to different things she has on her forehead and her Peri orbital area for which she is apparently applying Empire. She feels that what ever we are treating on her wounds is a more systemic problem. I really was not able to get a handle on what she is talking about however I did caution her not to put the [place] in her eyes. 11/1; her wounds continue to improve she is using Keystone and Aquacel Ag G under 3 layer compression. Our intake nurse notes erythema and edema in the right leg. The patient has a litany of concerns with regards to a rash on her forehead or ears and other systemic complaints. She has an appointment with dermatology on November 11 11/8; patient presents for follow-up. We have been using Keystone and Aquacel under 4-layer compression. She has no issues or complaints today. She reports improvement in wound healing. 11/15; patient presents for follow-up. We have been using Aquacel with Keystone antibiotic under 3 layer compression. Patient continues  improvement in wound healing. Electronic Signature(s) Signed: 10/25/2022 2:25:25 PM By: Kalman Shan DO Entered By: Kalman Shan on 10/25/2022 11:50:07 -------------------------------------------------------------------------------- Physical Exam Details Patient Name: Date of Service: Laura Jewett [name] 10/25/2022 9:45 A M Medical Record Number: [id number] Patient Account Number: [id number] Date of Birth/Sex: Treating RN: [birth year] (76 y.o. [name] Primary Care Provider: [place] Other Clinician: [name] Referring Provider: Treating Provider/Extender: [name] Weeks in Treatment: 54 Constitutional . Cardiovascular . Psychiatric . Notes Left lower extremity: T the distal medial and lateral aspect of the leg there are open wounds with granulation tissue and nonviable tissue. No surrounding signs o of infection. Venous stasis dermatitis. Adequate edema control. Electronic Signature(s) Signed: 10/25/2022 2:25:25 PM By: Kalman Shan DO Entered By: Kalman Shan on 10/25/2022 11:50:29 Laura Santiago ([id number]) [id number] Page 6 of 13 -------------------------------------------------------------------------------- Physician Orders Details Patient Name: Date of Service: Laura Santiago. 10/25/2022 9:45 A M Medical Record Number: [id number] Patient Account Number: [id number] Date of Birth/Sex: Treating RN: 08/10/1946 (76 y.o. [name] Primary Care Provider: [place] Other Clinician: [name] Referring Provider: Treating Provider/Extender: [name] Weeks in Treatment: 72 Verbal / Phone Orders: No Diagnosis Coding Follow-up Appointments Return Appointment in 1 week. Nurse Visit as needed [place]: - [place] for wound care. May utilize formulary equivalent dressing for  wound treatment orders unless otherwise specified. Home Health Nurse may visit PRN to address patients wound care needs. **Please direct any NON-WOUND related issues/requests for orders to patient's  Primary Care Physician. **If current dressing causes regression in wound condition, may D/C ordered dressing product/s and apply Normal Saline Moist Dressing daily until next [place] or Other MD appointment. **Notify Wound Healing Center of regression in wound condition at [phone]. Other Home Health Orders/Instructions: - Dressing change 3 x weekly, twice by home health and once at wound clinic weekly. PLEASE make sure frequency between dressing changes is appropriate. Bathing/ Shower/ Hygiene May shower with wound dressing protected with water repellent cover or cast protector. No tub bath. Anesthetic (Use 'Patient Medications' Section for Anesthetic Order Entry) Lidocaine applied to wound bed Edema Control - Lymphedema / Segmental Compressive Device / Other Optional: One layer of unna paste to top of compression wrap (to act as an anchor). - PLEASE when applying wrap start from toes and go up to just below knee 4 Layer Compression System Lymphedema. - left lower leg Tubigrip single layer applied. - Tubi D single right leg Elevate, Exercise Daily and A void Standing for Long Periods of Time. Elevate legs to the level of the heart and pump ankles as often as possible Elevate leg(s) parallel to the floor when sitting. DO YOUR BEST to sleep in the bed at night. DO NOT sleep in your recliner. Long hours of sitting in a recliner leads to swelling of the legs and/or potential wounds on your backside. Additional Orders / Instructions Follow Nutritious Diet and Increase Protein Intake Medications-Please add to medication list. Keystone Compound Wound Treatment Wound #1 - Lower Leg Wound Laterality: Left, Medial Cleanser: Wound Cleanser (Home Health) 1 x Per Week/30 Days Discharge  Instructions: Wash your hands with soap and water. Remove old dressing, discard into plastic bag and place into trash. Cleanse the wound with Wound Cleanser prior to applying a clean dressing using gauze sponges, not tissues or cotton balls. Do not scrub or use excessive force. Pat dry using gauze sponges, not tissue or cotton balls. Peri-Wound Care: Nystatin Cream USP 30 (g) 1 x Per Week/30 Days Discharge Instructions: Use Nystatin Cream as directed. MIx 1:1 with TCA cream and applied to peri wound Peri-Wound Care: Triamcinolone Acetonide Cream, 0.1%, 15 (g) tube 1 x Per Week/30 Days Discharge Instructions: Apply as directed. mix 1:1 with nystatin cream for peri wound Prim Dressing: Aquacel Extra Hydrofiber Dressing, 4x5 (in/in) ary 1 x Per Week/30 Days Prim Dressing: keystone compound 1 x Per Week/30 Days ary Discharge Instructions: apply to wound bed as directed on medication label PLEASE follow direction on pill bottle for proper mixing LILLIANAH, SWARTZENTRUBER ([id number]) [id number] Page 7 of 13 Secondary Dressing: Zetuvit Absorbent Pad, 4x8 (in/in) 1 x Per Week/30 Days Compression Wrap: Medichoice 4 layer Compression System, 35-40 mmHG 1 x Per Week/30 Days Discharge Instructions: Apply multi-layer wrap as directed. Wound #8 - Lower Leg Wound Laterality: Left, Lateral Cleanser: Wound Cleanser (Home Health) 1 x Per Week/30 Days Discharge Instructions: Wash your hands with soap and water. Remove old dressing, discard into plastic bag and place into trash. Cleanse the wound with Wound Cleanser prior to applying a clean dressing using gauze sponges, not tissues or cotton balls. Do not scrub or use excessive force. Pat dry using gauze sponges, not tissue or cotton balls. Peri-Wound Care: Nystatin Cream USP 30 (g) 1 x Per Week/30 Days Discharge Instructions: Use Nystatin Cream as directed. MIx 1:1 with TCA cream and applied to peri wound Peri-Wound Care: Triamcinolone  Acetonide Cream, 0.1%, 15 (g) tube 1 x Per Week/30 Days Discharge Instructions: Apply as directed. mix  1:1 with nystatin cream for peri wound Prim Dressing: Aquacel Extra Hydrofiber Dressing, 4x5 (in/in) ary 1 x Per Week/30 Days Prim Dressing: keystone compound 1 x Per Week/30 Days ary Discharge Instructions: apply to wound bed as directed on medication label PLEASE follow direction on pill bottle for proper mixing Secondary Dressing: Zetuvit Absorbent Pad, 4x8 (in/in) 1 x Per Week/30 Days Compression Wrap: Medichoice 4 layer Compression System, 35-40 mmHG 1 x Per Week/30 Days Discharge Instructions: Apply multi-layer wrap as directed. Electronic Signature(s) Signed: 10/25/2022 2:25:25 PM By: Kalman Shan DO Entered By: Kalman Shan on 10/25/2022 11:54:42 -------------------------------------------------------------------------------- Problem List Details Patient Name: Date of Service: Laura Jewett [name] 10/25/2022 9:45 A M Medical Record Number: [id number] Patient Account Number: [id number] Date of Birth/Sex: Treating RN: 02-27-1946 (76 y.o. [name] Primary Care Provider: [place] Other Clinician: [name] Referring Provider: Treating Provider/Extender: Kalman Shan [facility] Weeks in Treatment: 58 Active Problems ICD-10 Encounter Code Description Active Date MDM Diagnosis [phone] Non-pressure chronic ulcer of other part of left lower leg with fat layer exposed11/23/2022 No Yes 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 11/02/2021 No Yes I87.311 Chronic venous hypertension (idiopathic) with ulcer of right lower extremity 08/30/2022 No Yes I87.2 Venous insufficiency (chronic) (peripheral) 07/06/2021 No Yes TAFFANY, HEISER ([id number]) [id number] Page 8 of 13 Z79.01 Long term (current) use of anticoagulants 07/06/2021 No Yes I10 Essential (primary) hypertension  07/06/2021 No Yes 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 07/06/2021 No Yes Inactive Problems ICD-10 Code Description Active Date Inactive Date S81.802A Unspecified open wound, left lower leg, initial encounter 07/06/2021 07/06/2021 S91.101A Unspecified open wound of right great toe without damage to nail, initial encounter 08/24/2021 08/24/2021 S91.104A Unspecified open wound of right lesser toe(s) without damage to nail, initial encounter 08/24/2021 08/24/2021 Resolved Problems ICD-10 Code Description Active Date Resolved Date S91.104D Unspecified open wound of right lesser toe(s) without damage to nail, subsequent 08/31/2021 08/31/2021 encounter S91.201D Unspecified open wound of right great toe with damage to nail, subsequent encounter 08/31/2021 08/31/2021 Electronic Signature(s) Signed: 10/25/2022 2:25:25 PM By: Kalman Shan DO Entered By: Kalman Shan on 10/25/2022 11:37:21 -------------------------------------------------------------------------------- Progress Note Details Patient Name: Date of Service: Laura Jewett [name] 10/25/2022 9:45 A M Medical Record Number: [id number] Patient Account Number: [id number] Date of Birth/Sex: Treating RN: 28-May-1946 (76 y.o. [name] Primary Care Provider: [place] Other Clinician: [name] Referring Provider: Treating Provider/Extender: [name] [facility] Weeks in Treatment: 40 Subjective Chief Complaint Information obtained from Patient Left lower extremity wound Right toe wounds Left upper lateral thigh wounds ALAJIAH, DUTKIEWICZ ([id number]) [id number] Page 9 of 13 History of Present Illness (HPI) Admission 7/27 Ms. Leata Dominy is a 76 year old female with a past medical history of ADHD, metastatic breast cancer, stage IV chronic kidney disease, history of DVT on Xarelto and chronic venous insufficiency that presents to the clinic for a chronic left lower  extremity wound. She recently moved to [facility] 4 days ago. She was being followed by wound care center in [name]. She reports a 10-year history of wounds to her left lower extremity that eventually do heal with debridement and compression therapy. She states that the current wound reopened 4 months ago and she is using Vaseline and Coban. She denies signs of infection. 8/3; patient presents for 1 week follow-up. She reports no issues or complaints today. She states she had vascular studies done in the last  week. She denies signs of infection. She brought her little service dog with her today. 8/17; patient presents for follow-up. She has missed her last clinic appointment. She states she took the wrap off and attempted to rewrap her leg. She is having difficulty with transportation. She has her service dog with her today. Overall she feels well and reports improvement in wound healing. She denies signs of infection. She reports owning an old Velcro wrap compression and has this at her living facility 9/14; patient presents for follow-up. Patient states that over the past 2 to 3 weeks she developed toe wounds to her right foot. She attributes this to tight fitting shoes. She subsequently developed cellulitis in the right leg and has been treated by doxycycline by her oncologist. She reports improvement in symptoms however continues to have some redness and swelling to this leg. T the left lower extremity patient has been having her wraps changed with home health twice weekly. She states that the [facility] is not helping control o the drainage. Other than that she has no issues or complaints today. She denies signs of infection to the left lower extremity. 9/21; patient presents for follow-up. She reports seeing infectious disease for her cellulitis. She reports no further management. She has home health that changes the wraps twice weekly. She has no issues or complaints today.  She denies signs of infection. 10/5; patient presents for follow-up. She has no issues or complaints today. She denies signs of infection. She states that the right great toe has not been dressed by home health. 10/12; patient presents for follow-up. She has no issues or complaints today. She reports improvement in her wound healing. She has been using silver alginate to the right great toe wound. She denies signs of infection. 10/26; patient presents for follow-up. Home health did not have sorbact so they continued to use Hydrofera Blue under the wrap. She has been using silver alginate to the great toe wound however she did not have a dressing in place today. She currently denies signs of infection. 11/2; patient presents for follow-up. She has been using sorb act under the compression wrap. She reports using silver alginate to the toe wound again she does not have a dressing in place. She currently denies signs of infection. 11/23; patient presents for follow-up. Unfortunately she has missed her last 2 clinic appointments. She was last seen 3 weeks ago. She did her own compression wrap with Kerlix and Coban yesterday after seeing vein and vascular. She has not been dressing her right great toe wound. She currently denies signs of infection. 11/30; patient presents for 1 week follow-up. She states she changed her dressing last week prior to home health and use sorb act with Dakin's and Hydrofera Blue. Home health has changed the dressing as well and they have been using sorbact. T oday she reports increased redness to her right lower extremity. She has a history of cellulitis to this leg. She has been using silver alginate to the right great toe. Unfortunately she had an episode of diarrhea prior to coming in and had feces all over the right leg and to the wrap of her left leg. 12/7; patient presents for 1 week follow-up. She states that home health did not come out to change the dressing and she  took it off yesterday. It is unclear if she is dressing the right toe wound. She denies signs of infection. 12/14; patient presents for 1 week follow-up. She has no issues or  complaints today. 12/21; patient presents for follow-up. She has no issues or complaints today. She denies signs of infection. 12/28/2021; patient presents for follow-up. She was hospitalized for sepsis secondary to right lower extremity cellulitis On 12/23. She states she is currently at a SNF. She states that she was started on doxycycline this morning for her right great toe swelling and redness. She is not sure what dressings have been done to her left lower extremity for the past 3 weeks. She says its been mainly gauze with an [place] wrap. 1/25; patient presents for follow-up. She is still residing in a skilled nursing facility. She reports mild pain to the left lower extremity wound bed. She states she is going to see a podiatrist soon. 2/8; patient presents for follow-up. She has moved back to her residential community from her skilled nursing facility. She has no issues or complaints today. She denies signs of systemic infections. 2/15; patient presents for follow-up. He has no issues or complaints today. She denies systemic signs of infection. 2/22; patient presents for follow-up. She has no issues or complaints today. She denies signs of infection. 3/1; patient presents for follow-up. She states that home health came out the day after she was seen in our clinic and yesterday to do the wrap change. She denies signs of infection. She reports excoriated skin on the ankle. 3/8; patient presents for follow-up. She has no issues or complaints today. She denies signs of infection. 3/15; patient presents for follow-up. Home health has been coming out to change the dressings. She reports more tenderness to the wound site. She denies purulent drainage, increased warmth or erythema to the area. 4/5; patient presents for follow-up.  She has missed her last 2 clinic appointments. I have not seen her in 3 weeks. She was recently hospitalized for altered mental status. She was involuntarily committed. She was evaluated by psychiatry and deemed to have competency. There was no specific cause of her altered mental status. It was concluded that her physical and mental health were declining due to her chronic medical conditions. Currently home health has been coming out for dressing changes. Patient has also been doing her own dressing changes. She reports more skin breakdown to the periwound and now has a new wound. She denies fever/chills. She reports continued tenderness to the wound site. 4/12; patient with significant venous insufficiency and a large wound on her left lower leg taking up about 80% of the circumference of her lower leg. Cultures of this grew MRSA and Pseudomonas. She had completed a course of ciprofloxacin now is starting doxycycline. She has been using Dakin's wet-to-dry and a Tubigrip. She has home health twice a week and we change it once. 4/19; patient presents for follow-up. She completed her course of doxycycline. She has been using Dakin's wet-to-dry dressing and Tubigrip. Home health changes the dressing twice weekly. Currently she has no issues or complaints. 4/26; patient presents for follow-up. At last clinic visit orders for home health were Iodosorb under compression therapy. Unfortunately they did not have the dressing and have been using Dakin's and gentamicin under the wrap. Patient currently denies signs of infection. She has no issues or complaints today. 5/3; patient presents for follow-up. Again Iodosorb has not been used under the compression therapy when home health comes out to change the wrap and dressing. They have been using Sorbact. It is unclear why this is happening since we send orders weekly to the agency. She denies signs of infection. Patient LOEWENSTEIN, Cleone  [name] ([id number])  [id number] Page 10 of 13 has not purchased the [place] antibiotics. We reached out to the company and they said they have been trying to contact her on a regular basis. We gave the patient the number to call to order the medication. 5/10; patient presents for follow-up. She has no issues or complaints today. Again home health has not been using Iodosorb. Mepilex was on the wound bed. No other dressings noted. She brought in her Keystone antibiotics. She denies signs of infection. 5/17; patient presents for follow-up. Home health has come out twice since she was last seen. Joint well she has been using Keystone antibiotic with Sorbact under the compression wrap. She has no issues or complaints today. She denies signs of infection. 5/24; patient presents for follow-up. We have been using Keystone antibiotics with Sorbact under compression therapy. She is tolerating the treatment well. She is reporting improvement in wound healing. She denies signs of infection. 5/31; patient presents for follow-up. We continue to do [facility] antibiotics with Sorbact under compression therapy. She continues to report improvement in wound healing. Home health comes out and changes the dressing once weekly. 05-17-2022 upon evaluation today patient appears to be doing better in regard to her wound especially compared to the last time I saw her. Fortunately I do think that she is seeing improvements. With that being said I do believe that she may be benefit from sharp debridement today to clear away some of the necrotic debris I discussed that with her as well. She is an amendable to that plan. Otherwise she is very pleased with how the [employer] is doing for her. 6/14; patient presents for follow-up. We have been using Keystone antibiotic with Sorbact and absorbent dressings under 3 layer compression. She has no issues or complaints today. She reports improvement in wound healing. She denies signs  of infection. 6/21; patient presents for follow-up. We are continuing with [facility] antibiotic and Sorbact under 3 layer compression. Patient has no complaints. Continued wound healing is happening. She denies signs of infection. 6/28; patient presents for follow-up. We have been using Keystone antibiotic with Sorbact under 3 layer compression. Usually home health comes out and changes the dressing twice a week. Unfortunately they did not go out to change the dressing. It is unclear why. Patient did not call them. She currently denies signs of infection. 7/5; patient presents for follow-up. We have been using Keystone antibiotic with calcium alginate under 3 layer compression. She reports improvement in wound healing. She denies signs of infection. Home health has come out to do dressing changes twice this past week. 7/12; patient presents for follow-up. We have been using Keystone antibiotic with calcium alginate under 3 layer compression. Patient states that home health came out once last week to change the dressing. She reports improvement in wound healing. She currently denies signs of infection. 7/19; patient presents for follow-up. We have been using Keystone antibiotic with calcium alginate under 3 layer compression. Home health came out once last week to change the dressing. She has no issues or complaints today. She denies signs of infection. 8/2; patient presents for follow-up. We have been using Keystone antibiotic with calcium alginate under 3 layer compression. Unfortunately she missed her appointment last week and home health did not come out to do dressing changes. Patient currently denies signs of infection. 8/9; patient presents for follow-up. We have been using Keystone with calcium alginate under 3 layer compression. She states that home health came out once last  week. She currently denies signs of infection. Her wrap was completely wet. She states she was cleaning the top of the  leg and water soaked down into the wrap. 8/16; patient presents for follow-up. We have been using Keystone with calcium alginate under 3 layer compression. She states that home health came out twice last week. She has no issues or complaints today. 8/23; patient presents for follow-up. He has been using Keystone with calcium alginate under 3 layer compression. Home health came out twice last week. She denies signs of infection. 8/30; patient presents for follow-up. We have been using Keystone with calcium alginate under 3 layer compression. Home health came out once last week to change the dressing. Patient reports improvement in wound healing. She states she is almost done with her chemotherapy infusions and has 1 more left. 9/13; patient presents for follow-up. She has lost the capsules to her [facility] antibiotic which I believe is the vancomycin pills. She has her Zosyn powder today. We have been using Keystone antibiotic ointment with calcium alginate under 3 layer compression. She is concerned about systemic infection however her vitals are stable and there is no surrounding soft tissue infection. She would like to remain a patient in our wound care center however would like a second opinion for her wound care at another facility. She asked to be referred to [facility] wound care center. 9/20; patient presents for follow-up. She found her vancomycin capsules and brought in her complete Keystone antibiotic ointment set today. Unfortunately she has developed skin breakdown and Erythema to the right lower extremity With increased swelling. She states she went to a pow wow Over the weekend and was on her feet for extended periods of time. She saw her oncologist yesterday who prescribed her doxycycline for her right lower extremity erythema. 9/27; patient presents for follow-up. We have been using Keystone antibiotic with Aquacel under 3 layer compression to the lower extremities bilaterally. When home  health came and changed the wrap she secretly put coffee into the spray mix along with [facility] antibiotic on her leg thinking the acidic component would better activate the zoysn (sonething she discussed with her microbiologist brother). She has reported improvement in wound healing. 10/4; patient presents for follow-up. She has no issues or complaints today. We have been doing Aquacel and keystone under 3 layer compression to the lower extremities bilaterally. This morning she took the right lower extremity wrap off as it was uncomfortable. She has no open wounds to this leg. 10/11; patient presents for follow-up. We have been doing Aquacel with Keystone antibiotic ointment under 3 layer compression to the left lower extremity. She developed a small blister to the anterior aspect of the left leg noticed when the wrap was taken off on intake. She currently denies signs of infection. 10/18; patient presents for follow-up. We have been doing Aquacel with Keystone antibiotic ointment under 3 layer compression to the left lower extremity. There has been continued improvement in wound healing. She denies signs of infection. 10/25; patient arrives for treatment of venous insufficiency ulcers on her left lower leg both lateral and medial are remanence of apparently a circumferential wound. Much improved. We are using topicals Keystone and Aquacel Ag under 3 layer compression we continue to make good progress. The patient talk to me at some length with regards to different things she has on her forehead and her Peri orbital area for which she is apparently applying [place]. She feels that what ever we are treating on her  wounds is a more systemic problem. I really was not able to get a handle on what she is talking about however I did caution her not to put the [place] in her eyes. 11/1; her wounds continue to improve she is using Keystone and Aquacel Ag G under 3 layer compression. Our intake nurse notes  erythema and edema in the right leg. The patient has a litany of concerns with regards to a rash on her forehead or ears and other systemic complaints. She has an appointment with dermatology on November 11 11/8; patient presents for follow-up. We have been using Keystone and Aquacel under 4-layer compression. She has no issues or complaints today. She reports improvement in wound healing. 11/15; patient presents for follow-up. We have been using Aquacel with Keystone antibiotic under 3 layer compression. Patient continues improvement in wound healing. ALETHA, ALLEBACH ([id number]) [id number] Page 11 of 13 Objective Constitutional Vitals Time Taken: 10:10 AM, Height: 66 in, Weight: 153 lbs, BMI: 24.7, Temperature: 98.2 F, Pulse: 74 bpm, Respiratory Rate: 18 breaths/min, Blood Pressure: 130/70 mmHg. General Notes: Left lower extremity: T the distal medial and lateral aspect of the leg there are open wounds with granulation tissue and nonviable tissue. No o surrounding signs of infection. Venous stasis dermatitis. Adequate edema control. Integumentary (Hair, Skin) Wound #1 status is Open. Original cause of wound was Gradually Appeared. The date acquired was: 04/06/2021. The wound has been in treatment 68 weeks. The wound is located on the Left,Medial Lower Leg. The wound measures 4.5cm length x 0.9cm width x 0.1cm depth; 3.181cm^2 area and 0.318cm^3 volume. There is Fat Layer (Subcutaneous Tissue) exposed. There is no tunneling or undermining noted. There is a medium amount of serosanguineous drainage noted. The wound margin is flat and intact. There is medium (34-66%) red, pink granulation within the wound bed. There is a medium (34-66%) amount of necrotic tissue within the wound bed including Adherent Slough. Wound #8 status is Open. Original cause of wound was Pressure Injury. The date acquired was: 10/18/2022. The wound has been in treatment 1 weeks. The wound is  located on the Left,Lateral Lower Leg. The wound measures 1.8cm length x 2.3cm width x 0.2cm depth; 3.252cm^2 area and 0.65cm^3 volume. There is Fat Layer (Subcutaneous Tissue) exposed. There is no tunneling or undermining noted. There is a medium amount of serosanguineous drainage noted. There is no granulation within the wound bed. There is a small (1-33%) amount of necrotic tissue within the wound bed including Adherent Slough. 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 I debrided nonviable tissue. She is doing well with Keystone antibiotic ointment and Aquacel under compression therapy and I recommended continuing this. 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 There was a Excisional Skin/Subcutaneous Tissue Debridement with a total area of 2.5 sq cm performed by Kalman Shan, MD. With the following instrument(s): Curette to remove Viable and Non-Viable tissue/material. Material removed includes Subcutaneous Tissue and Slough and. A time out was conducted at 10:36, prior to the start of the procedure.  A Minimum amount of bleeding was controlled with Pressure. The procedure was tolerated well. Post Debridement Measurements: 4.5cm length x 0.9cm width x 0.1cm depth; 0.318cm^3 volume. Character of Wound/Ulcer Post Debridement is stable. Severity of Tissue Post Debridement is: Fat layer  exposed. 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 There was a Four Layer Compression Therapy Procedure with a pre-treatment ABI of 1.5 by [name]. Post procedure Diagnosis Wound #1: Same as Pre-Procedure Wound #8 Pre-procedure diagnosis of Wound #8 is a Pressure Ulcer located on the Left,Lateral Lower Leg . There was a Excisional Skin/Subcutaneous Tissue Debridement with a total area of 4.14 sq cm performed by Kalman Shan, MD. With the following instrument(s): Curette to remove Viable and Non-Viable tissue/material. Material removed includes Subcutaneous Tissue and Slough and. A time out was conducted at 10:34, prior to the start of the procedure. A Minimum amount of bleeding was controlled with Pressure. The procedure was tolerated well. Post Debridement Measurements: 1.8cm length x 2.3cm width x 0.2cm depth; 0.65cm^3 volume. Post debridement Stage noted as Category/Stage II. Character of Wound/Ulcer Post Debridement is stable. Post procedure Diagnosis Wound #8: Same as Pre-Procedure DARRAH, DREDGE ([id number]) [id number] Page 12 of 13 Plan Follow-up Appointments: Return Appointment in 1 week. Nurse Visit as needed Home Health: [facility]: - [facility] for wound care. May utilize formulary equivalent dressing for wound treatment orders unless otherwise specified. Home Health Nurse may visit [facility] to address patientoos wound care needs. **Please direct any NON-WOUND related issues/requests for orders to patient's Primary Care Physician. **If current dressing causes regression in wound condition, may D/C ordered dressing product/s and apply Normal Saline Moist Dressing daily until next [facility] or Other MD appointment. **Notify [facility] of regression in wound condition at  [phone]. Other Home Health Orders/Instructions: - Dressing change 3 x weekly, twice by home health and once at wound clinic weekly. PLEASE make sure frequency between dressing changes is appropriate. Bathing/ Shower/ Hygiene: May shower with wound dressing protected with water repellent cover or cast protector. No tub bath. Anesthetic (Use 'Patient Medications' Section for Anesthetic Order Entry): Lidocaine applied to wound bed Edema Control - Lymphedema / Segmental Compressive Device / Other: Optional: One layer of unna paste to top of compression wrap (to act as an anchor). - PLEASE when applying wrap start from toes and go up to just below knee 4 Layer Compression System Lymphedema. - left lower leg Tubigrip single layer applied. - Tubi D single right leg Elevate, Exercise Daily and Avoid Standing for Long Periods of Time. Elevate legs to the level of the heart and pump ankles as often as possible Elevate leg(s) parallel to the floor when sitting. DO YOUR BEST to sleep in the bed at night. DO NOT sleep in your recliner. Long hours of sitting in a recliner leads to swelling of the legs and/or potential wounds on your backside. Additional Orders / Instructions: Follow Nutritious Diet and Increase Protein Intake Medications-Please add to medication list.: Keystone Compound WOUND #1: - Lower Leg Wound Laterality: Left, Medial Cleanser: Wound Cleanser (Home Health) 1 x Per Week/30 Days Discharge Instructions: Wash your hands with soap and water. Remove old dressing, discard into plastic bag and place into trash. Cleanse the wound with Wound Cleanser prior to applying a clean dressing using gauze sponges, not tissues or cotton balls. Do not scrub or use excessive  force. Pat dry using gauze sponges, not tissue or cotton balls. Peri-Wound Care: Nystatin Cream USP 30 (g) 1 x Per Week/30 Days Discharge Instructions: Use Nystatin Cream as directed. MIx 1:1 with TCA cream and applied to peri  wound Peri-Wound Care: Triamcinolone Acetonide Cream, 0.1%, 15 (g) tube 1 x Per Week/30 Days Discharge Instructions: Apply as directed. mix 1:1 with nystatin cream for peri wound Prim Dressing: Aquacel Extra Hydrofiber Dressing, 4x5 (in/in) 1 x Per Week/30 Days ary Prim Dressing: keystone compound 1 x Per Week/30 Days ary Discharge Instructions: apply to wound bed as directed on medication label PLEASE follow direction on pill bottle for proper mixing Secondary Dressing: Zetuvit Absorbent Pad, 4x8 (in/in) 1 x Per Week/30 Days Com pression Wrap: Medichoice 4 layer Compression System, 35-40 mmHG 1 x Per Week/30 Days Discharge Instructions: Apply multi-layer wrap as directed. WOUND #8: - Lower Leg Wound Laterality: Left, Lateral Cleanser: Wound Cleanser (Home Health) 1 x Per Week/30 Days Discharge Instructions: Wash your hands with soap and water. Remove old dressing, discard into plastic bag and place into trash. Cleanse the wound with Wound Cleanser prior to applying a clean dressing using gauze sponges, not tissues or cotton balls. Do not scrub or use excessive force. Pat dry using gauze sponges, not tissue or cotton balls. Peri-Wound Care: Nystatin Cream USP 30 (g) 1 x Per Week/30 Days Discharge Instructions: Use Nystatin Cream as directed. MIx 1:1 with TCA cream and applied to peri wound Peri-Wound Care: Triamcinolone Acetonide Cream, 0.1%, 15 (g) tube 1 x Per Week/30 Days Discharge Instructions: Apply as directed. mix 1:1 with nystatin cream for peri wound Prim Dressing: Aquacel Extra Hydrofiber Dressing, 4x5 (in/in) 1 x Per Week/30 Days ary Prim Dressing: keystone compound 1 x Per Week/30 Days ary Discharge Instructions: apply to wound bed as directed on medication label PLEASE follow direction on pill bottle for proper mixing Secondary Dressing: Zetuvit Absorbent Pad, 4x8 (in/in) 1 x Per Week/30 Days Com pression Wrap: Medichoice 4 layer Compression System, 35-40 mmHG 1 x Per Week/30  Days Discharge Instructions: Apply multi-layer wrap as directed. 1. In office sharp debridement 2. Keystone antibiotic ointment with Aquacel under 3 layer compression 3. Follow-up in 1 week Electronic Signature(s) Signed: 10/25/2022 2:25:25 PM By: Kalman Shan DO Entered By: Kalman Shan on 10/25/2022 11:52:10 Laura Santiago ([id number]) [id number] Page 13 of 13 -------------------------------------------------------------------------------- SuperBill Details Patient Name: Date of Service: Laura Santiago. 10/25/2022 Medical Record Number: [id number] Patient Account Number: [id number] Date of Birth/Sex: Treating RN: August 03, 1946 (76 y.o. [name] Primary Care Provider: [place] Other Clinician: [name] Referring Provider: Treating Provider/Extender: [name] Weeks in Treatment: 68 Diagnosis Coding ICD-10 Codes Code Description [phone] 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 I87.311 Chronic venous hypertension (idiopathic) with ulcer of right lower extremity I87.2 Venous insufficiency (chronic) (peripheral) Z79.01 Long term (current) use of anticoagulants I10 Essential (primary) hypertension 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 Facility Procedures : CPT4 Code: [id number] Description: 11042 - DEB SUBQ TISSUE 20 SQ CM/< ICD-10 Diagnosis Description 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 Modifier: sed Quantity: 1 Physician Procedures : CPT4 Code Description Modifier [id number] 11042 - WC PHYS SUBQ TISS 20 SQ CM ICD-10 Diagnosis Description L97.822 Non-pressure chronic ulcer of other  part of left lower leg with fat layer exposed  I87.312 Chronic venous hypertension (idiopathic) with ulcer  of left lower extremity Quantity: 1 Electronic Signature(s) Signed: 10/25/2022 2:25:25 PM By: Kalman Shan DO Entered By: Kalman Shan on 10/25/2022 11:54:30

## 2022-10-27 NOTE — Progress Notes (Signed)
LEELOO, SILVERTHORNE ([id number]) [id number] Page 1 of 11 Visit Report for 10/25/2022 Arrival Information Details Patient Name: Date of Service: [name] 10/25/2022 9:45 A M Medical Record Number: [id number] Patient Account Number: [id number] Date of Birth/Sex: Treating RN: Apr 16, 1946 (76 y.o. [name] Primary Care Kyree Fedorko: [place] Other Clinician: [name] Referring Lakiya Cottam: Treating Aloma Boch/Extender: [name] Weeks in Treatment: 6 Visit Information History Since Last Visit All ordered tests and consults were completed: No Patient Arrived: [name] Added or deleted any medications: No Arrival Time: 10:04 Any new allergies or adverse reactions: No Transfer Assistance: None Had a fall or experienced change in No Patient Requires Transmission-Based No activities of daily living that may affect Precautions: risk of falls: Patient Has Alerts: Yes Signs or symptoms of abuse/neglect since last visito No Patient Alerts: PT HAS SERVICE Hospitalized since last visit: No ANIMAL ABI 07/11/21 Implantable device outside of the clinic excluding No R) 1.16 L) 1.27 cellular tissue based products placed in the center since last visit: Pain Present Now: No Electronic Signature(s) Signed: 10/26/2022 5:06:55 PM By: [name] Entered By: [name] on 10/25/2022 10:09:43 -------------------------------------------------------------------------------- Clinic Level of Care Assessment Details Patient Name: Date of Service: [name] 10/25/2022 9:45 A M Medical Record Number: [id number] Patient Account Number: [id number] Date of Birth/Sex: Treating RN: Mar 08, 1946 (76 y.o. [name] Primary Care Aadith Raudenbush: [place] Other Clinician: [name] Referring Presley Summerlin: Treating Seanne Chirico/Extender: [name] Weeks in Treatment: 96 Clinic Level  of Care Assessment Items TOOL 1 Quantity Score _0  - 0 Use when EandM and Procedure is performed on INITIAL visit ASSESSMENTS - Nursing Assessment / Reassessment _1  - 0 General Physical Exam (combine w/ comprehensive assessment (listed just below) when performed on new pt. evals) _2  - 0 Comprehensive Assessment (HX, ROS, Risk Assessments, Wounds Hx, etc.) INDRA, WOLTERS ([id number]) [id number] Page 2 of 11 ASSESSMENTS - Wound and Skin Assessment / Reassessment _3  - 0 Dermatologic / Skin Assessment (not related to wound area) ASSESSMENTS - Ostomy and/or Continence Assessment and Care _4  - 0 Incontinence Assessment and Management _5  - 0 Ostomy Care Assessment and Management (repouching, etc.) PROCESS - Coordination of Care _6  - 0 Simple Patient / Family Education for ongoing care _7  - 0 Complex (extensive) Patient / Family Education for ongoing care _8  - 0 Staff obtains Programmer, systems, Records, T Results / Process Orders est _9  - 0 Staff telephones HHA, Nursing Homes / Clarify orders / etc _10  - 0 Routine Transfer to another Facility (non-emergent condition) _11  - 0 Routine Hospital Admission (non-emergent condition) _12  - 0 New Admissions / Biomedical engineer / Ordering NPWT Apligraf, etc. , _13  - 0 Emergency Hospital Admission (emergent condition) PROCESS - Special Needs _14  - 0 Pediatric / Minor Patient Management _15  - 0 Isolation Patient Management _16  - 0 Hearing / Language / Visual special needs _17  - 0 Assessment of Community assistance (transportation, D/C planning, etc.) _18  - 0 Additional assistance / Altered mentation _19  - 0 Support Surface(s) Assessment (bed, cushion, seat, etc.) INTERVENTIONS - Miscellaneous _20  - 0 External ear exam _21  - 0 Patient Transfer (multiple staff / Civil Service fast streamer / Similar devices) _22  - 0 Simple Staple / Suture removal (25 or less) _23  - 0 Complex Staple / Suture removal (26 or more) _24  -  0 Hypo/Hyperglycemic Management (do not check if billed separately) _25  - 0 Ankle / Brachial Index (ABI) - do not  check if billed separately Has the patient been seen at the hospital within the last three years: Yes Total Score: 0 Level Of Care: ____ Electronic Signature(s) Signed: 10/26/2022 5:06:55 PM By: [name] Entered By: [name] on 10/25/2022 10:39:58 -------------------------------------------------------------------------------- Compression Therapy Details Patient Name: Date of Service: [name] 10/25/2022 9:45 A M Medical Record Number: [id number] Patient Account Number: [id number] Date of Birth/Sex: Treating RN: [birth year] (76 y.o. [name] Primary Care Ceasia Elwell: [place] Other Clinician: [name] Referring Alona Danford: Treating Julias Mould/Extender: Kalman Shan [facility] Weeks in Treatment: [address], [place] M ([id number]) [phone].pdf Page 3 of 11 Compression Therapy Performed for Wound Assessment: Wound #1 Left,Medial Lower Leg Performed By: [name], [name], Compression Type: Four Layer Pre Treatment ABI: 1.5 Post Procedure Diagnosis Same as Pre-procedure Electronic Signature(s) Signed: 10/26/2022 5:06:55 PM By: [name] Entered By: [name] on 10/25/2022 10:39:35 -------------------------------------------------------------------------------- Encounter Discharge Information Details Patient Name: Date of Service: [name] 10/25/2022 9:45 A M Medical Record Number: [id number] Patient Account Number: [id number] Date of Birth/Sex: Treating RN: March 14, 1946 (76 y.o. [name] Primary Care Gracelee Stemmler: [place] Other Clinician: [name] Referring Breeann Reposa: Treating Salmaan Patchin/Extender: [name] Weeks in Treatment: 3 Encounter Discharge Information Items Post Procedure Vitals Discharge Condition:  Stable Temperature (F): 98.2 Ambulatory Status: Walker Pulse (bpm): 74 Discharge Destination: Home Respiratory Rate (breaths/min): 18 Transportation: Other Blood Pressure (mmHg): 130/70 Accompanied By: self Schedule Follow-up Appointment: Yes Clinical Summary of Care: Electronic Signature(s) Signed: 10/26/2022 5:06:55 PM By: [name] Entered By: [name] on 10/25/2022 10:41:10 -------------------------------------------------------------------------------- Lower Extremity Assessment Details Patient Name: Date of Service: [name] 10/25/2022 9:45 A M Medical Record Number: [id number] Patient Account Number: [id number] Date of Birth/Sex: Treating RN: 1946-06-02 (76 y.o. [name] Primary Care Akari Defelice: [place] Other Clinician: [name] Referring Dshawn Mcnay: Treating Jamee Pacholski/Extender: [name] [facility] [place] in Treatment: [address] Edema Assessment L[LeftIRANIA, DURELL ([id number])] [Right: [id number] Page 4 of 11] Assessed: [Left: No] [Right: No] [Left: Edema] [Right: :] Calf Left: Right: Point of Measurement: 30 cm From Medial Instep Ankle Left: Right: Point of Measurement: 10 cm From Medial Instep Electronic Signature(s) Signed: 10/26/2022 5:06:55 PM By: [name] Signed: 10/27/2022 1:14:49 PM By: Carlene Coria RN Entered By: [name] on 10/25/2022 10:29:25 -------------------------------------------------------------------------------- Multi Wound Chart Details Patient Name: Date of Service: [name] 10/25/2022 9:45 A M Medical Record Number: [id number] Patient Account Number: [id number] Date of Birth/Sex: Treating RN: 1946/05/29 (76 y.o. [name] Primary Care Idali Lafever: [place] Other Clinician: [name] Referring Koleson Reifsteck: Treating Bobbie Valletta/Extender: [name] Weeks in Treatment: 22 Vital  Signs Height(in): 66 Pulse(bpm): 74 Weight(lbs): 153 Blood Pressure(mmHg): 130/70 Body Mass Index(BMI): 24.7 Temperature(F): 98.2 Respiratory Rate(breaths/min): 18 [1:Photos:] [N/A:N/A] Left, Medial Lower Leg Left, Lateral Lower Leg N/A Wound Location: Gradually Appeared Pressure Injury N/A Wounding Event: Venous Leg Ulcer Pressure Ulcer N/A Primary Etiology: Hypertension, Osteoarthritis, ReceivedHypertension, Osteoarthritis, ReceivedN/A Comorbid History: Chemotherapy, Received Radiation Chemotherapy, Received Radiation 04/06/2021 10/18/2022 N/A Date Acquired: 15 1 N/A Weeks of Treatment: Open Open N/A Wound Status: No No N/A Wound Recurrence: Yes No N/A Clustered Wound: 4.5x0.9x0.1 1.8x2.3x0.2 N/A Measurements L x W x D (cm) 3.181 3.252 N/A A (cm) : rea 0.318 0.65 N/A Volume (cm) : 96.50% 21.70% N/A % Reduction in Area: 98.20% 21.80% N/A % Reduction in Volume:  Full Thickness Without Exposed Category/Stage II N/A Classification: Support Structures Medium Medium N/A Exudate Amount: JAXON, MYNHIER ([id number]) [id number] Page 5 of 11 Serosanguineous Serosanguineous N/A Exudate Type: red, brown red, brown N/A Exudate Color: Flat and Intact N/A N/A Wound Margin: Medium (34-66%) None Present (0%) N/A Granulation Amount: Red, Pink N/A N/A Granulation Quality: Medium (34-66%) Small (1-33%) N/A Necrotic Amount: Fat Layer (Subcutaneous Tissue): Yes Fat Layer (Subcutaneous Tissue): Yes N/A Exposed Structures: Fascia: No Fascia: No Tendon: No Tendon: No Muscle: No Muscle: No Joint: No Joint: No Bone: No Bone: No Small (1-33%) None N/A Epithelialization: Treatment Notes Electronic Signature(s) Signed: 10/26/2022 5:06:55 PM By: [name] Entered By: [name] on 10/25/2022 10:29:37 -------------------------------------------------------------------------------- Multi-Disciplinary Care Plan Details Patient Name:  Date of Service: [name], [place] 10/25/2022 9:45 A M Medical Record Number: [id number] Patient Account Number: [id number] Date of Birth/Sex: Treating RN: 1946/11/11 (75 y.o. [name] Primary Care Mical Brun: [place] Other Clinician: [name] Referring Barbera Perritt: Treating [place] Current/Extender: [name] Weeks in Treatment: 35 Active Inactive Soft Tissue Infection Nursing Diagnoses: Impaired tissue integrity Potential for infection: soft tissue Goals: Patient's soft tissue infection will resolve Date Initiated: 03/15/2022 Target Resolution Date: 04/27/2022 Goal Status: Active Signs and symptoms of infection will be recognized early to allow for prompt treatment Date Initiated: 03/15/2022 Date Inactivated: 04/26/2022 Target Resolution Date: 04/27/2022 Goal Status: Met Interventions: Assess signs and symptoms of infection every visit Treatment Activities: Culture and sensitivity : 03/15/2022 Notes: Wound/Skin Impairment Nursing Diagnoses: Impaired tissue integrity Goals: Patient/caregiver will verbalize understanding of skin care regimen Date Initiated: 07/06/2021 Date Inactivated: 07/27/2021 Target Resolution Date: 07/06/2021 TARA, RUD ([id number]) [id number] Page 6 of 11 Goal Status: Met Ulcer/skin breakdown will have a volume reduction of 30% by week 4 Date Initiated: 07/06/2021 Date Inactivated: 10/12/2021 Target Resolution Date: 08/06/2021 Goal Status: Unmet Unmet Reason: cont tx Ulcer/skin breakdown will have a volume reduction of 50% by week 8 Date Initiated: 07/06/2021 Target Resolution Date: 09/06/2021 Goal Status: Active Ulcer/skin breakdown will have a volume reduction of 80% by week 12 Date Initiated: 07/06/2021 Target Resolution Date: 10/06/2021 Goal Status: Active Ulcer/skin breakdown will heal within 14 weeks Date Initiated: 07/06/2021 Target Resolution Date: 11/06/2021 Goal  Status: Active Interventions: Assess patient/caregiver ability to obtain necessary supplies Assess patient/caregiver ability to perform ulcer/skin care regimen upon admission and as needed Assess ulceration(s) every visit Treatment Activities: Referred to DME Kiasha Bellin for dressing supplies : 07/06/2021 Skin care regimen initiated : 07/06/2021 Notes: Electronic Signature(s) Signed: 10/26/2022 5:06:55 PM By: [name] Signed: 10/27/2022 1:14:49 PM By: Carlene Coria RN Entered By: [name] on 10/25/2022 10:29:31 -------------------------------------------------------------------------------- Pain Assessment Details Patient Name: Date of Service: [name] 10/25/2022 9:45 A M Medical Record Number: [id number] Patient Account Number: [id number] Date of Birth/Sex: Treating RN: [birth year] (76 y.o. [name] Primary Care Masiel Gentzler: [place] Other Clinician: [name] Referring Ivette Castronova: Treating Payal Stanforth/Extender: [name] [facility] [place] in Treatment: 68 Active Problems Location of Pain Severity and Description of Pain Patient Has Paino No Site Locations [place], [place] ([id number]) [phone].pdf Page 7 of 11 Pain Management and Medication Current Pain Management: Electronic Signature(s) Signed: 10/26/2022 5:06:55 PM By: [name] Signed: 10/27/2022 1:14:49 PM By: Carlene Coria RN Entered By: [name] on 10/25/2022 10:14:45 -------------------------------------------------------------------------------- Patient/Caregiver Education Details Patient Name: Date of Service: [name] 11/15/2023andnbsp9:45 A M Medical Record Number: [id number] Patient Account Number: [id number] Date  of Birth/Gender: Treating RN: Jan 23, 1946 (76 y.o. [name] Primary Care Physician: [place] Other Clinician: [name] Referring Physician: Treating Physician/Extender:  Hoffman, [place] Weeks in Treatment: 12 Education Assessment Education Provided To: Patient Education Topics Provided Wound/Skin Impairment: Handouts: Other: continue wound care as directed Methods: Explain/Verbal Responses: State content correctly Electronic Signature(s) Signed: 10/26/2022 5:06:55 PM By: [name] Entered By: [name] on 10/25/2022 10:40:23 -------------------------------------------------------------------------------- Wound Assessment Details Patient Name: Date of Service: [name] 10/25/2022 9:45 A M Medical Record Number: [id number] Patient Account Number: [id number] Date of Birth/Sex: Treating RN: 1946/06/12 (76 y.o. [name] Primary Care Brandy [name]: [place] Other Clinician: [name] Referring Jensine Luz: Treating Milca Sytsma/Extender: Kalman Shan [facility] Weeks in Treatment: [address] Wound Status PETRONELLA, [place] M ([id number]) [id number] Page 8 of 11 Wound Number: 1 Primary Venous Leg Ulcer Etiology: Wound Location: Left, Medial Lower Leg Wound Status: Open Wounding Event: Gradually Appeared Comorbid Hypertension, Osteoarthritis, Received Chemotherapy, Date Acquired: 04/06/2021 History: Received Radiation Weeks Of Treatment: 68 Clustered Wound: Yes Photos Wound Measurements Length: (cm) 4.5 Width: (cm) 0.9 Depth: (cm) 0.1 Area: (cm) 3.181 Volume: (cm) 0.318 % Reduction in Area: 96.5% % Reduction in Volume: 98.2% Epithelialization: Small (1-33%) Tunneling: No Undermining: No Wound Description Classification: Full Thickness Without Exposed Support Structures Wound Margin: Flat and Intact Exudate Amount: Medium Exudate Type: Serosanguineous Exudate Color: red, brown Foul Odor After Cleansing: No Slough/Fibrino Yes Wound Bed Granulation Amount: Medium (34-66%) Exposed Structure Granulation Quality: Red, Pink Fascia Exposed:  No Necrotic Amount: Medium (34-66%) Fat Layer (Subcutaneous Tissue) Exposed: Yes Necrotic Quality: Adherent Slough Tendon Exposed: No Muscle Exposed: No Joint Exposed: No Bone Exposed: No Treatment Notes Wound #1 (Lower Leg) Wound Laterality: Left, Medial Cleanser Wound Cleanser Discharge Instruction: Wash your hands with soap and water. Remove old dressing, discard into plastic bag and place into trash. Cleanse the wound with Wound Cleanser prior to applying a clean dressing using gauze sponges, not tissues or cotton balls. Do not scrub or use excessive force. Pat dry using gauze sponges, not tissue or cotton balls. Peri-Wound Care Nystatin Cream USP 30 (g) Discharge Instruction: Use Nystatin Cream as directed. MIx 1:1 with TCA cream and applied to peri wound Triamcinolone Acetonide Cream, 0.1%, 15 (g) tube Discharge Instruction: Apply as directed. mix 1:1 with nystatin cream for peri wound Topical Primary Dressing Aquacel Extra Hydrofiber Dressing, 4x5 (in/in) keystone compound Discharge Instruction: apply to wound bed as directed on medication label PLEASE follow direction on pill bottle for proper mixing Secondary Dressing Zetuvit Absorbent Pad, 4x8 (in/in) Secured With Compression Wrap Medichoice 4 layer Compression System, 35-40 mmHG Discharge Instruction: Apply multi-layer wrap as directed. MAITE, BURLISON ([id number]) [id number] Page 9 of 11 Compression Stockings Add-Ons Electronic Signature(s) Signed: 10/26/2022 5:06:55 PM By: [name] Signed: 10/27/2022 1:14:49 PM By: Carlene Coria RN Entered By: [name] on 10/25/2022 10:27:11 -------------------------------------------------------------------------------- Wound Assessment Details Patient Name: Date of Service: [name] 10/25/2022 9:45 A M Medical Record Number: [id number] Patient Account Number: [id number] Date of Birth/Sex: Treating RN: 11-27-1946 (76 y.o. [name] Primary Care Wafa Martes: [place] Other Clinician: [name] Referring Evia Goldsmith: Treating Arin Peral/Extender: [name] Weeks in Treatment: 68 Wound Status Wound Number: 8 Primary Pressure Ulcer Etiology: Wound Location: Left, Lateral Lower Leg Wound Status: Open Wounding Event: Pressure Injury Comorbid Hypertension, Osteoarthritis, Received Chemotherapy, Date Acquired: 10/18/2022 History: Received Radiation Weeks Of  Treatment: 1 Clustered Wound: No Photos Wound Measurements Length: (cm) 1.8 Width: (cm) 2.3 Depth: (cm) 0.2 Area: (cm) 3.252 Volume: (cm) 0.65 % Reduction in Area: 21.7% % Reduction in Volume: 21.8% Epithelialization: None Tunneling: No Undermining: No Wound Description Classification: Category/Stage II Exudate Amount: Medium Exudate Type: Serosanguineous Exudate Color: red, brown Foul Odor After Cleansing: No Slough/Fibrino Yes Wound Bed Granulation Amount: None Present (0%) Exposed Structure Necrotic Amount: Small (1-33%) Fascia Exposed: No Necrotic Quality: Adherent Slough Fat Layer (Subcutaneous Tissue) Exposed: Yes Tendon Exposed: No Muscle Exposed: No WYLLOW, SEIGLER ([id number]) [id number] Page 10 of 11 Joint Exposed: No Bone Exposed: No Treatment Notes Wound #8 (Lower Leg) Wound Laterality: Left, Lateral Cleanser Wound Cleanser Discharge Instruction: Wash your hands with soap and water. Remove old dressing, discard into plastic bag and place into trash. Cleanse the wound with Wound Cleanser prior to applying a clean dressing using gauze sponges, not tissues or cotton balls. Do not scrub or use excessive force. Pat dry using gauze sponges, not tissue or cotton balls. Peri-Wound Care Nystatin Cream USP 30 (g) Discharge Instruction: Use Nystatin Cream as directed. MIx 1:1 with TCA cream and applied to peri wound Triamcinolone Acetonide Cream, 0.1%, 15 (g)  tube Discharge Instruction: Apply as directed. mix 1:1 with nystatin cream for peri wound Topical Primary Dressing Aquacel Extra Hydrofiber Dressing, 4x5 (in/in) keystone compound Discharge Instruction: apply to wound bed as directed on medication label PLEASE follow direction on pill bottle for proper mixing Secondary Dressing Zetuvit Absorbent Pad, 4x8 (in/in) Secured With Compression Wrap Medichoice 4 layer Compression System, 35-40 mmHG Discharge Instruction: Apply multi-layer wrap as directed. Compression Stockings Add-Ons Electronic Signature(s) Signed: 10/26/2022 5:06:55 PM By: [name] Signed: 10/27/2022 1:14:49 PM By: Carlene Coria RN Entered By: [name] on 10/25/2022 10:27:46 -------------------------------------------------------------------------------- Vitals Details Patient Name: Date of Service: [name], [place]. 10/25/2022 9:45 A M Medical Record Number: [id number] Patient Account Number: [id number] Date of Birth/Sex: Treating RN: [birth year] (76 y.o. [name] Primary Care Annesha Delgreco: [place] Other Clinician: [name] Referring Shambria Camerer: Treating Braxten Memmer/Extender: [name] Weeks in Treatment: 33 Vital Signs Time Taken: 10:10 Temperature (F): 98.2 Height (in): 66 Pulse (bpm): 74 Weight (lbs): 153 Respiratory Rate (breaths/min): 18 Body Mass Index (BMI): 24.7 Blood Pressure (mmHg): 130/70 Reference Range: 80 - 120 mg / dl Electronic Signature(s) RONETTE, HANK ([id number]) [id number] Page 11 of 11 Signed: 10/26/2022 5:06:55 PM By: [name] Entered By: [name] on 10/25/2022 10:14:40

## 2022-10-31 ENCOUNTER — Telehealth: Payer: Self-pay | Admitting: Oncology

## 2022-10-31 ENCOUNTER — Other Ambulatory Visit: Payer: Medicare Other

## 2022-10-31 ENCOUNTER — Inpatient Hospital Stay: Payer: Medicare Other | Admitting: Oncology

## 2022-10-31 ENCOUNTER — Inpatient Hospital Stay: Payer: Medicare Other

## 2022-10-31 ENCOUNTER — Ambulatory Visit: Payer: Medicare Other

## 2022-10-31 NOTE — Telephone Encounter (Signed)
[name] called for Laura Santiago and got answering service. She wanted to cancel her 830 am appt. Spoke with  [name] and she  will call her to reschedule

## 2022-11-01 ENCOUNTER — Encounter: Payer: Medicare Other | Admitting: Internal Medicine

## 2022-11-03 ENCOUNTER — Telehealth: Payer: Self-pay | Admitting: Student

## 2022-11-03 NOTE — Telephone Encounter (Signed)
Palliative NP left message for patient's daughter to make her aware of new palliative NP to follow patient in community. Unable to reach patient via telephone.

## 2022-11-04 ENCOUNTER — Emergency Department
Admission: EM | Admit: 2022-11-04 | Discharge: 2022-11-04 | Disposition: A | Discharge status: Home / Self Care | Payer: Medicare Other | Attending: Emergency Medicine | Admitting: Emergency Medicine

## 2022-11-04 ENCOUNTER — Emergency Department: Payer: Medicare Other

## 2022-11-04 ENCOUNTER — Other Ambulatory Visit: Payer: Self-pay

## 2022-11-04 DIAGNOSIS — I10 Essential (primary) hypertension: Secondary | ICD-10-CM | POA: Diagnosis not present

## 2022-11-04 DIAGNOSIS — Z853 Personal history of malignant neoplasm of breast: Secondary | ICD-10-CM | POA: Diagnosis not present

## 2022-11-04 DIAGNOSIS — M542 Cervicalgia: Secondary | ICD-10-CM | POA: Insufficient documentation

## 2022-11-04 LAB — CBC WITH DIFFERENTIAL/PLATELET
Abs Immature Granulocytes: 0.03 10*3/uL (ref 0.00–0.07)
Basophils Absolute: 0 10*3/uL (ref 0.0–0.1)
Basophils Relative: 0 %
Eosinophils Absolute: 0.1 10*3/uL (ref 0.0–0.5)
Eosinophils Relative: 1 %
HCT: 34.4 % — ABNORMAL LOW (ref 36.0–46.0)
Hemoglobin: 10.4 g/dL — ABNORMAL LOW (ref 12.0–15.0)
Immature Granulocytes: 0 %
Lymphocytes Relative: 8 %
Lymphs Abs: 0.7 10*3/uL (ref 0.7–4.0)
MCH: 26.5 pg (ref 26.0–34.0)
MCHC: 30.2 g/dL (ref 30.0–36.0)
MCV: 87.8 fL (ref 80.0–100.0)
Monocytes Absolute: 0.5 10*3/uL (ref 0.1–1.0)
Monocytes Relative: 6 %
Neutro Abs: 7.3 10*3/uL (ref 1.7–7.7)
Neutrophils Relative %: 85 %
Platelets: 104 10*3/uL — ABNORMAL LOW (ref 150–400)
RBC: 3.92 MIL/uL (ref 3.87–5.11)
RDW: 16.6 % — ABNORMAL HIGH (ref 11.5–15.5)
WBC: 8.6 10*3/uL (ref 4.0–10.5)
nRBC: 0 % (ref 0.0–0.2)

## 2022-11-04 LAB — URINALYSIS, ROUTINE W REFLEX MICROSCOPIC
Bilirubin Urine: NEGATIVE
Glucose, UA: NEGATIVE mg/dL
Ketones, ur: NEGATIVE mg/dL
Leukocytes,Ua: NEGATIVE
Nitrite: NEGATIVE
Protein, ur: NEGATIVE mg/dL
Specific Gravity, Urine: 1.014 (ref 1.005–1.030)
Squamous Epithelial / HPF: NONE SEEN (ref 0–5)
pH: 6 (ref 5.0–8.0)

## 2022-11-04 LAB — COMPREHENSIVE METABOLIC PANEL
ALT: 12 U/L (ref 0–44)
AST: 24 U/L (ref 15–41)
Albumin: 3.8 g/dL (ref 3.5–5.0)
Alkaline Phosphatase: 75 U/L (ref 38–126)
Anion gap: 9 (ref 5–15)
BUN: 28 mg/dL — ABNORMAL HIGH (ref 8–23)
CO2: 24 mmol/L (ref 22–32)
Calcium: 9 mg/dL (ref 8.9–10.3)
Chloride: 107 mmol/L (ref 98–111)
Creatinine, Ser: 1.14 mg/dL — ABNORMAL HIGH (ref 0.44–1.00)
GFR, Estimated: 49 mL/min — ABNORMAL LOW (ref >60)
Glucose, Bld: 119 mg/dL — ABNORMAL HIGH (ref 70–99)
Potassium: 4.7 mmol/L (ref 3.5–5.1)
Sodium: 140 mmol/L (ref 135–145)
Total Bilirubin: 1 mg/dL (ref 0.3–1.2)
Total Protein: 7.6 g/dL (ref 6.5–8.1)

## 2022-11-04 LAB — LIPASE, BLOOD: Lipase: 34 U/L (ref 11–51)

## 2022-11-04 NOTE — ED Notes (Signed)
Pt has curving veins. Stuck once, will have next shift look for IV/bloodwork. [employer] labels at bedside.

## 2022-11-04 NOTE — ED Notes (Signed)
See triage note. 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 Wearing fentanyl patch.

## 2022-11-04 NOTE — ED Triage Notes (Addendum)
Pt presents to ED with c/o of neck pain, pt states pain started this morning. Pt states HX of metastatic CA. Pt denies injury or trauma. Pt denies numbness or tingling. Pt states HX of FX to C2-C3.    Pt has 79mg fentanyl patch at this time.

## 2022-11-04 NOTE — ED Provider Notes (Signed)
[facility] Provider Note  Patient Contact: 6:40 PM (approximate)   History   Neck Pain   HPI  Laura Santiago is a 76 y.o. female with a history of breast cancer, pathologic fractures of the cervical spine, prior DVTs, arthritis and hypertension, presents to the emergency department with new neck pain that started today.  No new paresthesias.  No chest pain, chest tightness or abdominal pain.  Patient states that is been 2 weeks since she had blood work conducted.      Physical Exam   Triage Vital Signs: ED Triage Vitals  Enc Vitals Group     BP 11/04/22 1712 (!) 159/90     Pulse Rate 11/04/22 1712 94     Resp 11/04/22 1712 18     Temp 11/04/22 1712 99.5 F (37.5 C)     Temp Source 11/04/22 1712 Oral     SpO2 11/04/22 1712 100 %     Weight --      Height --      Head Circumference --      Peak Flow --      Pain Score 11/04/22 1722 10     Pain Loc --      Pain Edu? --      Excl. in GC? --     Most recent vital signs: Vitals:   11/04/22 1712  BP: (!) 159/90  Pulse: 94  Resp: 18  Temp: 99.5 F (37.5 C)  SpO2: 100%     General: Alert and in no acute distress. Eyes:  PERRL. EOMI. Head: No acute traumatic findings ENT:      Nose: No congestion/rhinnorhea.      Mouth/Throat: Mucous membranes are moist. Neck: No stridor. No cervical spine tenderness to palpation. Cardiovascular:  Good peripheral perfusion Respiratory: Normal respiratory effort without tachypnea or retractions. Lungs CTAB. Good air entry to the bases with no decreased or absent breath sounds. Gastrointestinal: Bowel sounds 4 quadrants. Soft and nontender to palpation. No guarding or rigidity. No palpable masses. No distention. No CVA tenderness. Musculoskeletal: Full range of motion to all extremities.  Neurologic:  No gross focal neurologic deficits are appreciated.  Skin:   No rash noted    ED Results / Procedures / Treatments   Labs (all labs ordered are  listed, but only abnormal results are displayed) Labs Reviewed  CBC WITH DIFFERENTIAL/PLATELET - Abnormal; Notable for the following components:      Result Value   Hemoglobin 10.4 (*)    HCT 34.4 (*)    RDW 16.6 (*)    Platelets 104 (*)    All other components within normal limits  COMPREHENSIVE METABOLIC PANEL - Abnormal; Notable for the following components:   Glucose, Bld 119 (*)    BUN 28 (*)    Creatinine, Ser 1.14 (*)    GFR, Estimated 49 (*)    All other components within normal limits  URINALYSIS, ROUTINE W REFLEX MICROSCOPIC - Abnormal; Notable for the following components:   Color, Urine YELLOW (*)    APPearance CLEAR (*)    Hgb urine dipstick MODERATE (*)    Bacteria, UA RARE (*)    All other components within normal limits  LIPASE, BLOOD       RADIOLOGY  I personally viewed and evaluated these images as part of my medical decision making, as well as reviewing the written report by the radiologist.  ED Provider Interpretation: No new pathological fractures of the cervical spine.  PROCEDURES:  Critical Care performed: No  Procedures   MEDICATIONS ORDERED IN ED: Medications - No data to display   IMPRESSION / MDM / ASSESSMENT AND PLAN / ED COURSE  I reviewed the triage vital signs and the nursing notes.                              Assessment and plan: Neck pain:   76 year old female presents to the emergency department with neck pain that started today.  Patient was hypertensive at triage but vital signs were otherwise reassuring.  CBC and CMP were consistent with her baseline.  No new pathological fractures.  Patient receives chronic pain medications.  Reassurance was given and return precautions were given to return with new or worsening symptoms.   FINAL CLINICAL IMPRESSION(S) / ED DIAGNOSES   Final diagnoses:  Neck pain     Rx / DC Orders   ED Discharge Orders     None        Note:  This document was prepared using Dragon voice  recognition software and may include unintentional dictation errors.   Pia Mau [place], Cordelia Poche 11/04/22 2222    Sharman Cheek, MD 11/05/22 1939

## 2022-11-04 NOTE — ED Provider Triage Note (Signed)
Emergency Medicine Provider Triage Evaluation Note  Laura Santiago, a 76 y.o. female  was evaluated in triage.  Pt complains of acute neck pain.  Patient with a history of metastatic breast disease and prior cervical compression fracture with radiculopathy presents with new symptoms.  She believes onset may have occurred after she attempted to pick up her husky, toddler grandson yesterday.  Review of Systems  Positive: Acute intermittent neck pain Negative: Distal paresthesias  Physical Exam  BP (!) 159/90 (BP Location: Left Arm)   Pulse 94   Temp 99.5 F (37.5 C) (Oral)   Resp 18   SpO2 100%  Gen:   Awake, no distress  NAD Resp:  Normal effort CTA MSK:   Moves extremities without difficulty  Other:    Medical Decision Making  Medically screening exam initiated at 5:36 PM.  Appropriate orders placed.  LYNNET HEFLEY was informed that the remainder of the evaluation will be completed by another provider, this initial triage assessment does not replace that evaluation, and the importance of remaining in the ED until their evaluation is complete.  Geriatric patient to the ED for evaluation of acute intermittent neck pain.   Melvenia Needles, PA-C 11/05/22 2306

## 2022-11-07 ENCOUNTER — Ambulatory Visit (INDEPENDENT_AMBULATORY_CARE_PROVIDER_SITE_OTHER): Payer: Medicare Other | Admitting: Dermatology

## 2022-11-07 DIAGNOSIS — L97829 Non-pressure chronic ulcer of other part of left lower leg with unspecified severity: Secondary | ICD-10-CM

## 2022-11-07 DIAGNOSIS — L97819 Non-pressure chronic ulcer of other part of right lower leg with unspecified severity: Secondary | ICD-10-CM | POA: Diagnosis not present

## 2022-11-07 DIAGNOSIS — L219 Seborrheic dermatitis, unspecified: Secondary | ICD-10-CM | POA: Diagnosis not present

## 2022-11-07 DIAGNOSIS — M793 Panniculitis, unspecified: Secondary | ICD-10-CM | POA: Diagnosis not present

## 2022-11-07 MED ORDER — HYDROCORTISONE 2.5 % EX CREA: TOPICAL_CREAM | CUTANEOUS | 3 refills | Status: DC

## 2022-11-07 MED ORDER — KETOCONAZOLE 2 % EX SHAM: MEDICATED_SHAMPOO | CUTANEOUS | 3 refills | Status: DC

## 2022-11-07 NOTE — Patient Instructions (Addendum)
Try for itchy areas on forehead and scalp. Avoid eye areas.   Start ketoconazole shampoo - apply three times per week, massage into scalp and leave in for 5 minutes before rinsing out  Start hydrocortisone cream - use 1 to 2 times daily as needed for itch at face, scalp, forehead, and ears  Topical steroids (such as triamcinolone, fluocinolone, fluocinonide, mometasone, clobetasol, halobetasol, betamethasone, hydrocortisone) can cause thinning and lightening of the skin if they are used for too long in the same area. Your physician has selected the right strength medicine for your problem and area affected on the body. Please use your medication only as directed by your physician to prevent side effects.    Recommend [facility] Hypochlorous Spray (found in the wound care section) OR Cln brand Acne or Sports wash. The [facility] Hypochlorous Spray can be sprayed on daily and left on. The Cln wash should be applied to the affected area daily for at least 30 seconds and then rinsed off. If you are using clindamycin solution or lotion or another topical antibiotic to treat acne, using a hypochlorous product may help lower the risk of antibiotic resistant bacteria.    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    Due to recent changes in healthcare laws, you may see results of your pathology and/or laboratory studies on MyChart before the doctors have had a chance to review them. We understand that in some cases there may be results that are confusing or concerning to you. Please understand that not all results are received at the same time and often the doctors may need to interpret multiple results in order to provide you with the best plan of care or course of treatment. Therefore, we ask that you please give [place] 2 business days to thoroughly review all your results before contacting the office for clarification. Should we see  a critical lab result, you will be contacted sooner.   If You Need Anything After Your Visit  If you have any questions or concerns for your doctor, please call our main line at [phone] and press option 4 to reach your doctor's medical assistant. If no one answers, please leave a voicemail as directed and we will return your call as soon as possible. Messages left after 4 pm will be answered the following business day.   You may also send [place] a message via [place]. We typically respond to MyChart messages within 1-2 business days.  For prescription refills, please ask your pharmacy to contact our office. Our fax number is [phone].  If you have an urgent issue when the clinic is closed that cannot wait until the next business day, you can page your doctor at the number below.    Please note that while we do our best to be available for urgent issues outside of office hours, we are not available 24/7.   If you have an urgent issue and are unable to reach [place], you may choose to seek medical care at your doctor's office, retail clinic, urgent care center, or emergency room.  If you have a medical emergency, please immediately call 911 or go to the emergency department.  Pager Numbers  - Dr. Nehemiah Massed: [phone]  - Dr. Laurence Ferrari: [phone]  - Dr. Nicole Kindred: [phone]  In the event of inclement weather, please call our main line at [phone] for an update on the status of any delays or closures.  Dermatology Medication Tips: Please  keep the boxes that topical medications come in in order to help keep track of the instructions about where and how to use these. Pharmacies typically print the medication instructions only on the boxes and not directly on the medication tubes.   If your medication is too expensive, please contact our office at [phone] option 4 or send [place] a message through [place].   We are unable to tell what your co-pay for medications will be in advance as  this is different depending on your insurance coverage. However, we may be able to find a substitute medication at lower cost or fill out paperwork to get insurance to cover a needed medication.   If a prior authorization is required to get your medication covered by your insurance company, please allow [place] 1-2 business days to complete this process.  Drug prices often vary depending on where the prescription is filled and some pharmacies may offer cheaper prices.  The website www.goodrx.com contains coupons for medications through different pharmacies. The prices here do not account for what the cost may be with help from insurance (it may be cheaper with your insurance), but the website can give you the price if you did not use any insurance.  - You can print the associated coupon and take it with your prescription to the pharmacy.  - You may also stop by our office during regular business hours and pick up a GoodRx coupon card.  - If you need your prescription sent electronically to a different pharmacy, notify our office through [facility] or by phone at [phone] option 4.     Si Usted Necesita Algo Despus de Su Visita  Tambin puede enviarnos un mensaje a travs de Pharmacist, community. Por lo general respondemos a los mensajes de MyChart en el transcurso de 1 a 2 das hbiles.  Para renovar recetas, por favor pida a su farmacia que se ponga en contacto con nuestra oficina. Harland Dingwall de fax es [place] [phone].  Si tiene un asunto urgente cuando la clnica est cerrada y que no puede esperar hasta el siguiente da hbil, puede llamar/localizar a su doctor(a) al nmero que aparece a continuacin.   Por favor, tenga en cuenta que aunque hacemos todo lo posible para estar disponibles para asuntos urgentes fuera del horario de [place], no estamos disponibles las 24 horas del da, los 7 das de la [place].   Si tiene un problema urgente y no puede comunicarse con nosotros, puede optar por  buscar atencin mdica  en el consultorio de su doctor(a), en una clnica privada, en un centro de atencin urgente o en una sala de emergencias.  Si tiene Engineering geologist, por favor llame inmediatamente al 911 o vaya a la sala de emergencias.  Nmeros de bper  - Dr. Nehemiah Massed: [phone]  - Dra. Moye: [phone]  - Dra. Nicole Kindred: [phone]  En caso de inclemencias del [place], por favor llame a Johnsie Kindred principal al [phone] para una actualizacin sobre el [place] de cualquier retraso o cierre.  Consejos para la medicacin en dermatologa: Por favor, guarde las cajas en las que vienen los medicamentos de uso tpico para ayudarle a seguir las instrucciones sobre dnde y cmo usarlos. Las farmacias generalmente imprimen las instrucciones del medicamento slo en las cajas y no directamente en los tubos del [place].   Si su medicamento es muy caro, por favor, pngase en contacto con Zigmund Daniel llamando al [phone] y presione la opcin 4 o envenos un mensaje a travs de  MyChart.   No podemos decirle cul ser su copago por los medicamentos por adelantado ya que esto es diferente dependiendo de la cobertura de su seguro. Sin embargo, es posible que podamos encontrar un medicamento sustituto a Electrical engineer un formulario para que el seguro cubra el medicamento que se considera necesario.   Si se requiere una autorizacin previa para que su compaa de seguros [place] su medicamento, por favor permtanos de 1 a 2 das hbiles para completar este proceso.  Los precios de los medicamentos varan con frecuencia dependiendo del Environmental consultant de dnde se surte la receta y alguna farmacias pueden ofrecer precios ms baratos.  El sitio web www.goodrx.com tiene cupones para medicamentos de Airline pilot. Los precios aqu no tienen en cuenta lo que podra costar con la ayuda del seguro (puede ser ms barato con su seguro), pero el sitio web puede darle el precio si no  utiliz Research scientist (physical sciences).  - Puede imprimir el cupn correspondiente y llevarlo con su receta a la farmacia.  - Tambin puede pasar por nuestra oficina durante el horario de atencin regular y Charity fundraiser una tarjeta de cupones de GoodRx.  - Si necesita que su receta se enve electrnicamente a una farmacia diferente, informe a nuestra oficina a travs de MyChart de [place] o por telfono llamando al [phone] y presione la opcin 4.

## 2022-11-07 NOTE — Progress Notes (Signed)
New Patient Visit  Subjective  Laura Santiago is a 76 y.o. female who presents for the following: New Patient (Initial Visit) (Patient here with daughter today concerning itchy areas at scalp and forehead. She states she has been dealing with for years. ).  She has had these problems for over 20 years.  Feels like a crawling sensation and she says she gets strings that come off her skin when it improves. She has had these scalp symptoms before she had chemo for breast cancer.  She insists on getting the same topical antibiotic spray for her head that was used for her leg ulcer, since she thinks they are related conditions and the spray helped her leg.   The following portions of the chart were reviewed this encounter and updated as appropriate:       Review of Systems:  No other skin or systemic complaints except as noted in HPI or Assessment and Plan.  Objective  Well appearing patient in no apparent distress; mood and affect are within normal limits.  A focused examination was performed including face, forehead, ears, scalp and b/l legs. Relevant physical exam findings are noted in the Assessment and Plan.  b/l lower extremeties Bl lower legs with erythema and hardening of skin with hour glass deformity, healing ulceration on left lower leg.  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 Condition is symptomatic/ bothersome to patient. Not currently at goal.   Lipodermatosclerosis is a chronic inflammatory condition of unknown cause of the subcutaneous fat causing tenderness, discoloration and hardening of the involved skin, most commonly on the lower legs. Discussed that it may progress and gradually worsen over  time, especially in the setting of chronic leg swelling. Daily compression stockings/hose is recommended.   History of ulcers at b/l lower extremities  Currently being seen by wound care for healing wound at left lower extremities- healing well with topical compounded antibiotic spray.     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 Condition is bothersome/symptomatic for patient. Currently flared. Condition not related to leg ulcers and the topical antibiotic spray for leg ulcers not indicated for scalp itching  Seborrheic Dermatitis  -  is a chronic persistent rash characterized by pinkness and scaling most commonly of the mid face but also can occur on the scalp (dandruff), ears; mid chest, mid back and groin.  It tends to be exacerbated by stress and cooler weather.  People who have neurologic disease may experience new onset or exacerbation of existing seborrheic dermatitis.  The condition is not curable but treatable and can be controlled.  Start ketoconazole shampoo 2 % - apply topically every other day massage into scalp and leave in for 5 minutes before rinsing out.   Start Hydrocortisone 2.5 % cream - apply to itchy rash at face, scalp, ears and forehead qd/bid.   Topical steroids (such as triamcinolone, fluocinolone, fluocinonide, mometasone, clobetasol, halobetasol, betamethasone, hydrocortisone) can cause thinning and lightening of the skin if they are used for too long in the same area. Your physician has selected the right strength medicine for your problem and area affected on the body. Please use your medication only  as directed by your physician to prevent side effects.   Recommend [facility] Hypochlorous Spray (found in the wound care section). The [facility] Hypochlorous Spray can be sprayed on daily as needed for itch and left on.   Avoid eye areas.  Will reevaluate in 1 month.   ketoconazole (NIZORAL) 2 % shampoo - scalp, forehead, ears, and face apply every other day  massage into scalp and leave in for 5 minutes before rinsing out  hydrocortisone 2.5 % cream - scalp, forehead, ears, and face Apply 1 to 2 times daily for itchy rash at face, scalp, ears, and forehead.   Return for 1 month follow up.   I, Ruthell Rummage, CMA, am acting as scribe for Brendolyn Patty, MD.  Documentation: I have reviewed the above documentation for accuracy and completeness, and I agree with the above.  Brendolyn Patty MD

## 2022-11-08 ENCOUNTER — Encounter: Payer: Medicare Other | Admitting: Internal Medicine

## 2022-11-08 ENCOUNTER — Inpatient Hospital Stay: Payer: Medicare Other | Admitting: Medical Oncology

## 2022-11-14 ENCOUNTER — Encounter: Payer: Self-pay | Admitting: Oncology

## 2022-11-14 ENCOUNTER — Inpatient Hospital Stay: Payer: Medicare Other | Attending: Oncology

## 2022-11-14 ENCOUNTER — Inpatient Hospital Stay (HOSPITAL_BASED_OUTPATIENT_CLINIC_OR_DEPARTMENT_OTHER): Payer: Medicare Other | Admitting: Oncology

## 2022-11-14 ENCOUNTER — Other Ambulatory Visit: Payer: Self-pay | Admitting: *Deleted

## 2022-11-14 VITALS — BP 145/81 | HR 85 | Temp 98.0°F | Resp 16 | Wt 142.2 lb

## 2022-11-14 DIAGNOSIS — Z800 Family history of malignant neoplasm of digestive organs: Secondary | ICD-10-CM | POA: Insufficient documentation

## 2022-11-14 DIAGNOSIS — I129 Hypertensive chronic kidney disease with stage 1 through stage 4 chronic kidney disease, or unspecified chronic kidney disease: Secondary | ICD-10-CM | POA: Insufficient documentation

## 2022-11-14 DIAGNOSIS — C778 Secondary and unspecified malignant neoplasm of lymph nodes of multiple regions: Secondary | ICD-10-CM | POA: Insufficient documentation

## 2022-11-14 DIAGNOSIS — Z79899 Other long term (current) drug therapy: Secondary | ICD-10-CM

## 2022-11-14 DIAGNOSIS — C50911 Malignant neoplasm of unspecified site of right female breast: Secondary | ICD-10-CM | POA: Insufficient documentation

## 2022-11-14 DIAGNOSIS — Z170 Estrogen receptor positive status [ER+]: Secondary | ICD-10-CM | POA: Diagnosis not present

## 2022-11-14 DIAGNOSIS — C50919 Malignant neoplasm of unspecified site of unspecified female breast: Secondary | ICD-10-CM

## 2022-11-14 DIAGNOSIS — Z5181 Encounter for therapeutic drug level monitoring: Secondary | ICD-10-CM | POA: Diagnosis not present

## 2022-11-14 DIAGNOSIS — Z808 Family history of malignant neoplasm of other organs or systems: Secondary | ICD-10-CM | POA: Diagnosis not present

## 2022-11-14 DIAGNOSIS — C7951 Secondary malignant neoplasm of bone: Secondary | ICD-10-CM | POA: Insufficient documentation

## 2022-11-14 DIAGNOSIS — Z8042 Family history of malignant neoplasm of prostate: Secondary | ICD-10-CM | POA: Diagnosis not present

## 2022-11-14 DIAGNOSIS — Z86718 Personal history of other venous thrombosis and embolism: Secondary | ICD-10-CM | POA: Insufficient documentation

## 2022-11-14 DIAGNOSIS — Z7901 Long term (current) use of anticoagulants: Secondary | ICD-10-CM | POA: Insufficient documentation

## 2022-11-14 DIAGNOSIS — N184 Chronic kidney disease, stage 4 (severe): Secondary | ICD-10-CM | POA: Insufficient documentation

## 2022-11-14 LAB — CBC WITH DIFFERENTIAL/PLATELET
Abs Immature Granulocytes: 0.01 10*3/uL (ref 0.00–0.07)
Basophils Absolute: 0 10*3/uL (ref 0.0–0.1)
Basophils Relative: 1 %
Eosinophils Absolute: 0.1 10*3/uL (ref 0.0–0.5)
Eosinophils Relative: 3 %
HCT: 33.6 % — ABNORMAL LOW (ref 36.0–46.0)
Hemoglobin: 10.1 g/dL — ABNORMAL LOW (ref 12.0–15.0)
Immature Granulocytes: 0 %
Lymphocytes Relative: 19 %
Lymphs Abs: 0.8 10*3/uL (ref 0.7–4.0)
MCH: 26.4 pg (ref 26.0–34.0)
MCHC: 30.1 g/dL (ref 30.0–36.0)
MCV: 87.7 fL (ref 80.0–100.0)
Monocytes Absolute: 0.3 10*3/uL (ref 0.1–1.0)
Monocytes Relative: 6 %
Neutro Abs: 3.3 10*3/uL (ref 1.7–7.7)
Neutrophils Relative %: 71 %
Platelets: 317 10*3/uL (ref 150–400)
RBC: 3.83 MIL/uL — ABNORMAL LOW (ref 3.87–5.11)
RDW: 15.9 % — ABNORMAL HIGH (ref 11.5–15.5)
WBC: 4.5 10*3/uL (ref 4.0–10.5)
nRBC: 0 % (ref 0.0–0.2)

## 2022-11-14 LAB — COMPREHENSIVE METABOLIC PANEL
ALT: 13 U/L (ref 0–44)
AST: 20 U/L (ref 15–41)
Albumin: 3.5 g/dL (ref 3.5–5.0)
Alkaline Phosphatase: 69 U/L (ref 38–126)
Anion gap: 8 (ref 5–15)
BUN: 27 mg/dL — ABNORMAL HIGH (ref 8–23)
CO2: 27 mmol/L (ref 22–32)
Calcium: 9.5 mg/dL (ref 8.9–10.3)
Chloride: 106 mmol/L (ref 98–111)
Creatinine, Ser: 1.28 mg/dL — ABNORMAL HIGH (ref 0.44–1.00)
GFR, Estimated: 43 mL/min — ABNORMAL LOW (ref >60)
Glucose, Bld: 112 mg/dL — ABNORMAL HIGH (ref 70–99)
Potassium: 4.1 mmol/L (ref 3.5–5.1)
Sodium: 141 mmol/L (ref 135–145)
Total Bilirubin: 0.3 mg/dL (ref 0.3–1.2)
Total Protein: 7.4 g/dL (ref 6.5–8.1)

## 2022-11-14 NOTE — Progress Notes (Signed)
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  She had a screening mammogram in September 2014 which showed 2.1 x 2.3 x 1.8 cm irregular mass in her right breast.  It was ER 10% positive PR negative and HER2 positive +3.  She received neoadjuvant chemotherapy with Taxol Herceptin and Perjeta for 4 cycles followed by dose dense AC/Herceptin x4 which she completed in March 2015.  She had a right lumpectomy on 04/03/2014 which showed scant residual invasive ductal carcinoma [id number].  She completed 1 year of adjuvant Herceptin chemotherapy and also completed adjuvant radiation treatment.  She was recommended anastrozole which she took on and off starting November 2015 and stopped sometime in 2020.   She was then hospitalized with neck pain and was found to have lytic lesions involving C5-C6 with pathological vertebral fractures.  She underwent radiation treatment to this area.  Image guided biopsy of the L1 vertebral body showed metastatic carcinoma consistent with breast origin ER 10% PR 0% and HER2 amplified ratio 5.1 average HER2 signal number per cell 15.0 average CEP 17 signals number per cell 3.0.  Baseline echocardiogram on 05/02/2021 showed a normal EF of 62% she was recommended Taxol Herceptin and  Perjeta which she received for 2 cycles at [facility] until June 10, 2021   She has chronic pain from her bone metastases for which she is currently on oxycodone 10 mg every 4 hours as needed and 12 mcg fentanyl patch.  She was seeing pain clinic when she was living in [name].   Patient has also been on Zometa when she was in [name] but she does have some ongoing dental issues.  She has received Xgeva in the past as well.  Her last Delton See was in July 2021.  Last PET scan was on 03/21/2021 which showed diffuse osseous metastatic disease involving the head neck chest abdomen and pelvis and spine.  Left lung apex hypermetabolic nodule and multiple hypermetabolic liver lesions concerning for disease involvement.  Enlarged hypermetabolic left inguinal lymph nodes along with hypermetabolic external iliac and left supraclavicular lymph nodes   Patient is now moved to [place] to be close to her daughter.  She lives in an independent living.  Patient received Taxol Herceptin and Perjeta for about a year and is presently on maintenance Herceptin and Perjeta      Interval history-patient feels well overall.  Her left lower extremity wound is gradually improving.  She reports that the crawling feeling that she had over her scalp does not exist anymore.  Denies any cough or shortness of breath.  ECOG PS- 2 Pain scale- 0   Review of systems- Review of Systems  Constitutional:  Positive for malaise/fatigue. Negative for chills, fever and weight loss.  HENT:  Negative for congestion, ear discharge and nosebleeds.   Eyes:  Negative for blurred vision.  Respiratory:  Negative for cough, hemoptysis,  sputum production, shortness of breath and wheezing.   Cardiovascular:  Negative for chest pain, palpitations, orthopnea and claudication.  Gastrointestinal:  Negative for abdominal pain, blood in stool, constipation, diarrhea, heartburn, melena, nausea and vomiting.  Genitourinary:  Negative for dysuria, flank pain,  frequency, hematuria and urgency.  Musculoskeletal:  Negative for back pain, joint pain and myalgias.  Skin:  Negative for rash.  Neurological:  Negative for dizziness, tingling, focal weakness, seizures, weakness and headaches.  Endo/Heme/Allergies:  Does not bruise/bleed easily.  Psychiatric/Behavioral:  Negative for depression and suicidal ideas. The patient does not have insomnia.       Allergies  Allergen Reactions   Other Diarrhea and Nausea And Vomiting    Pt has had episodes of nausea, vomiting and diarrhea right after receiving IV technetium for NM study on two occasions.   Technetium Tc 67mMedronate Diarrhea and Nausea And Vomiting    Pt has had episodes of nausea, vomiting and diarrhea right after receiving IV technetium for NM study on two occasions.    Corticosteroids Other (See Comments)    [place]. Notes that it causes agitation intolerance   Sulfa Antibiotics Other (See Comments)    [place] has allergy but we do not know reactions when taking the drug   Celebrex [Celecoxib] Rash     Past Medical History:  Diagnosis Date   ADHD (attention deficit hyperactivity disorder)    [place] note   Anemia    IDA 11/26/2019, Anemia in stage 4 chronic kidney disease ([place] 09/03/2020   Arthritis    osteoarthritis right knee 09/30/2014   Breast cancer ([place] 10/05/2013   [place] Her 2 is 3+   Cancer related pain 03/28/2021   [place]   cervical compression fracture 03[place]18/2021   [place]   DVT of [place] extremity, bilateral ([place] 03/30/2014   [place]  Generalized muscle weakness 03/31/2016   [place]  GERD (gastroesophageal reflux disease) 05/15/2013   [place]  Hyperparathyroidism, secondary ([place] 04/25/2018   [place]  Hypertension 02/20/2016   [place]   [place]  Metabolic acid[place] 011/55/2080  [place]  Metabolic syndrome 022/33/6122  [place]  Osteopenia after menopause 03/29/2016   [place]  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   [place]  Stasis ulcer of left lower extremity ([place] 03/29/2016   [place]    Past Surgical History:  Procedure Laterality Date   CESAREAN SECTION     unknown   fibroid removed  N/A    [place]   IR FLUORO GUIDE CV LINE LEFT  07/27/2021   IR PORT REPAIR CENTRAL VENOUS ACCESS DEVICE Left    [place]      unknown   PORTA CATH INSERTION N/A 02/13/2022   Procedure: PORTA CATH INSERTION;  Surgeon: DAlgernon Huxley MD;  [facility];  Service: Cardiovascular;  Laterality: N/A;   [place] LENS Bilateral  Bilateral 09/05/2012   [place]    [place]    [place]UMPECTOMY Right 04[place]03/2014   [place]  Social History   Socioeconomic History   Marital status: Divorced    Spouse name: Not on file   Number of children: Not on file   Years of education: Not on file   Highest education level: Not on file  Occupational History   Occupation: retired Teacher, music    Comment: In [place]  Tobacco Use   Smoking status: Never   Smokeless tobacco: Never  Vaping Use   Vaping Use: Never used  Substance and Sexual Activity   Alcohol use: Not Currently   Drug use: Not Currently   Sexual activity: Not Currently  Other Topics Concern   Not on file  Social History Narrative   Not on file   Social Determinants of Health   Financial Resource Strain: Low Risk  (03/17/2022)   Overall Financial Resource Strain (CARDIA)    Difficulty of Paying Living Expenses: Not hard at all  Food Insecurity: No Food Insecurity (03/17/2022)   Hunger Vital Sign    Worried About Running Out of Food in the Last Year: Never true    Ran Out of Food in the Last Year: Never true  Transportation Needs: No Transportation Needs (03/17/2022)   PRAPARE -  Hydrologist (Medical): No    Lack of Transportation (Non-Medical): No  Physical Activity: Inactive (03/17/2022)   Exercise Vital Sign    Days of Exercise per Week: 0 days    Minutes of Exercise per Session: 0 min  Stress: No Stress Concern Present (03/17/2022)   [place]    Feeling of Stress : Only a little  Social Connections: Socially Isolated (03/17/2022)   Social Connection and Isolation Panel [NHANES]    Frequency of Communication with Friends and Family: Twice a week    Frequency of Social Gatherings with Friends and Family: Twice a week    Attends Religious Services: Never    Marine scientist or Organizations: No    Attends Archivist Meetings: Never    Marital Status: Widowed  Intimate Partner Violence: Not At Risk (03/17/2022)   Humiliation, Afraid, Rape, and Kick questionnaire    Fear of Current or Ex-Partner: No    Emotionally Abused: No    Physically Abused: No    Sexually Abused: No    Family History  Problem Relation Age of Onset   Pancreatic cancer Mother    Stroke Father    Diabetes Father    Hypertension Father    Heart disease Father    Skin cancer Father    Varicose Veins Father    Skin cancer Brother    Cancer - Prostate Brother      Current Outpatient Medications:    acetaminophen (TYLENOL) 500 MG tablet, Take 500 mg by mouth every 4 (four) hours as needed., Disp: , Rfl:    amphetamine-dextroamphetamine (ADDERALL XR) 30 MG 24 hr capsule, Take 1 capsule (30 mg total) by mouth daily., Disp: 30 capsule, Rfl: 0   doxycycline (VIBRA-TABS) 100 MG tablet, Take 1 tablet (100 mg total) by mouth 2 (two) times daily., Disp: 14 tablet, Rfl: 0   fentaNYL (DURAGESIC) 25 MCG/HR, PLACE 1 PATCH ONTO THE SKIN EVERY THREE DAYS AS DIRECTED., Disp: 10 patch, Rfl: 0   gabapentin (NEURONTIN) 100 MG capsule, TAKE 1 CAPSULE BY MOUTH 3 TIMES DAILY, Disp: 90 capsule, Rfl:  1   hydrocortisone 2.5 % cream, Apply 1 to 2 times daily for itchy rash at  face, scalp, ears, and forehead., Disp: 30 g, Rfl: 3   ketoconazole (NIZORAL) 2 % shampoo, apply every other day  massage into scalp and leave in for 5 minutes before rinsing out, Disp: 120 mL, Rfl: 3   lisinopril (ZESTRIL) 20 MG tablet, Take 1 tablet (20 mg total) by mouth daily., Disp: 30 tablet, Rfl: 3   LORazepam (ATIVAN) 0.5 MG tablet, Take 1 tablet (0.5 mg total) by mouth daily as needed for anxiety., Disp: 30 tablet, Rfl: 0   Multiple Vitamin (MULTI-VITAMIN) tablet, Take 1 tablet by mouth daily., Disp: , Rfl:    NON FORMULARY, Apply 1 Dose topically daily. Pt mixes up piperacillim and tazobactam and puts it on the wounds of left leg, Disp: , Rfl:    Oxycodone HCl 10 MG TABS, Take 1.5 tablets (15 mg total) by mouth every 6 (six) hours as needed., Disp: 90 tablet, Rfl: 0   rivaroxaban (XARELTO) 20 MG TABS tablet, Take 1 tablet (20 mg total) by mouth daily with supper., Disp: 30 tablet, Rfl: 3   sodium hypochlorite (DAKIN'S 1/4 STRENGTH) 0.125 % SOLN, Apply 1 application topically as directed. Moisten gauze with solution and wrap wound, Disp: , Rfl:    calcium citrate (CALCITRATE - DOSED IN MG ELEMENTAL CALCIUM) 950 (200 Ca) MG tablet, Take 200 mg of elemental calcium by mouth daily. (Patient not taking: Reported on 10/10/2022), Disp: , Rfl:    DULoxetine (CYMBALTA) 30 MG capsule, Take by mouth. (Patient not taking: Reported on 06/06/2022), Disp: , Rfl:    gentamicin cream (GARAMYCIN) 0.1 %, SMARTSIG:1 Application Topical (Patient not taking: Reported on 03/14/2022), Disp: , Rfl:    hydrOXYzine (ATARAX) 25 MG tablet, Take by mouth. (Patient not taking: Reported on 08/15/2022), Disp: , Rfl:    naloxone (NARCAN) nasal spray 4 mg/0.1 mL, SPRAY 1 SPRAY INTO ONE NOSTRIL AS DIRECTED FOR OPIOID OVERDOSE (TURN PERSON ON SIDE AFTER DOSE. IF NO RESPONSE IN 2-3 MINUTES OR PERSON RESPONDS BUT RELAPSES, REPEAT USING A NEW SPRAY DEVICE AND  SPRAY INTO THE OTHER NOSTRIL. CALL 911 AFTER USE.) * EMERGENCY USE ONLY * (Patient not taking: Reported on 08/29/2022), Disp: 1 each, Rfl: 0   ondansetron (ZOFRAN) 8 MG tablet, Take 1 tablet (8 mg total) by mouth as needed for nausea or vomiting. (Patient not taking: Reported on 08/29/2022), Disp: 30 tablet, Rfl: 0 No current facility-administered medications for this visit.  Facility-Administered Medications Ordered in Other Visits:    heparin lock flush 100 UNIT/ML injection, , , ,   Physical exam:  Vitals:   11/14/22 0924  BP: (!) 145/81  Pulse: 85  Resp: 16  Temp: 98 F (36.7 C)  SpO2: 100%  Weight: 142 lb 3.2 oz (64.5 kg)   Physical Exam Constitutional:      General: She is not in acute distress. Cardiovascular:     Rate and Rhythm: Normal rate and regular rhythm.     Heart sounds: Normal heart sounds.  Pulmonary:     Effort: Pulmonary effort is normal.     Breath sounds: Normal breath sounds.  Abdominal:     General: Bowel sounds are normal.     Palpations: Abdomen is soft.  Musculoskeletal:     Comments: Ace wrap in place over left lower extremity.  Mild chronic edema and erythema noted over right lower extremity without any overt evidence of cellulitis.  Skin:    General: Skin is warm and dry.  Neurological:     Mental Status: She is alert and oriented  to person, place, and time.         Latest Ref Rng & Units 11/14/2022    8:55 AM  CMP  Glucose 70 - 99 mg/dL 112   BUN 8 - 23 mg/dL 27   Creatinine 0.44 - 1.00 mg/dL 1.28   Sodium 135 - 145 mmol/L 141   Potassium 3.5 - 5.1 mmol/L 4.1   Chloride 98 - 111 mmol/L 106   CO2 22 - 32 mmol/L 27   Calcium 8.9 - 10.3 mg/dL 9.5   Total Protein 6.5 - 8.1 g/dL 7.4   Total Bilirubin 0.3 - 1.2 mg/dL 0.3   Alkaline Phos 38 - 126 U/L 69   AST 15 - 41 U/L 20   ALT 0 - 44 U/L 13       Latest Ref Rng & Units 11/14/2022    8:55 AM  CBC  WBC 4.0 - 10.5 K/uL 4.5   Hemoglobin 12.0 - 15.0 g/dL 10.1   Hematocrit 36.0 - 46.0 %  33.6   Platelets 150 - 400 K/uL 317     No images are attached to the encounter.  CT Cervical Spine Wo Contrast  Result Date: 11/04/2022 CLINICAL DATA:  Neck pain history of metastatic cancer EXAM: CT CERVICAL SPINE WITHOUT CONTRAST TECHNIQUE: Multidetector CT imaging of the cervical spine was performed without intravenous contrast. Multiplanar CT image reconstructions were also generated. RADIATION DOSE REDUCTION: This exam was performed according to the departmental dose-optimization program which includes automated exposure control, adjustment of the mA and/or kV according to patient size and/or use of iterative reconstruction technique. COMPARISON:  Cervical CT 12/02/2021 FINDINGS: Alignment: Reversal of cervical lordosis. 4 mm anterolisthesis C5 on C6. trace retrolisthesis C4 on C5. These findings are stable. Facet alignment is maintained. Skull base and vertebrae: No fracture. Heterogeneous sclerosis, consistent with history of metastatic disease. Partial fusion of the C2 and C3 vertebral bodies. Mild diffuse loss of height C6, unchanged. Soft tissues and spinal canal: No prevertebral fluid or swelling. No visible canal hematoma. Disc levels: Multilevel degenerative changes. Moderate disc space narrowing C4-C5, C5-C6 and C7-T1. Advanced disc space narrowing at C7-T1. Facet degenerative changes at multiple levels with foraminal narrowing. Upper chest: Negative. Other: None IMPRESSION: 1. Reversal of cervical lordosis with similar anterolisthesis C5 on C6 and mild retrolisthesis C4 on C5 2. Extensive heterogeneous sclerosis throughout the cervical spine consistent with history of skeletal metastatic disease. No acute fracture. Electronically Signed   By: Donavan Foil M.D.   On: 11/04/2022 18:43   ECHOCARDIOGRAM COMPLETE  Result Date: 10/25/2022    ECHOCARDIOGRAM REPORT   Patient Name:   SAPNA PADRON Date of Exam: 10/25/2022 Medical Rec #:  [id number]        Height:       65.0 in Accession #:     [id number]       Weight:       143.6 lb Date of Birth:  [birth year]        BSA:          1.718 m Patient Age:    47 years         BP:           126/63 mmHg Patient Gender: F                HR:           70 bpm. Exam Location:  [facility] Procedure: 2D Echo, Color Doppler, Cardiac Doppler and Strain Analysis Indications:  Cellulitis of right lower extremity  History:         Patient has prior history of Echocardiogram examinations, most                  recent 09/13/2022. Signs/Symptoms:Edema; Risk                  Factors:Hypertension.  Sonographer:     Charmayne Sheer Referring Phys:  [id number] Weston Anna Dazaria Macneill Diagnosing Phys: Kate Sable MD  Sonographer Comments: Global longitudinal strain was attempted. IMPRESSIONS  1. Left ventricular ejection fraction, by estimation, is 40 to 45%. Left ventricular ejection fraction by 2D MOD biplane is 41.5 %. The left ventricle has mild to moderately decreased function. The left ventricle demonstrates global hypokinesis. Left ventricular diastolic parameters are indeterminate. The average left ventricular global longitudinal strain is -13.3 %. The global longitudinal strain is abnormal.  2. Right ventricular systolic function is low normal. The right ventricular size is normal.  3. Left atrial size was mildly dilated.  4. The mitral valve is normal in structure. Mild to moderate mitral valve regurgitation.  5. The aortic valve is tricuspid. Aortic valve regurgitation is not visualized.  6. The inferior vena cava is dilated in size with <50% respiratory variability, suggesting right atrial pressure of 15 mmHg. FINDINGS  Left Ventricle: Left ventricular ejection fraction, by estimation, is 40 to 45%. Left ventricular ejection fraction by 2D MOD biplane is 41.5 %. The left ventricle has mild to moderately decreased function. The left ventricle demonstrates global hypokinesis. The average left ventricular global longitudinal strain is -13.3 %. The global longitudinal strain is abnormal.  The left ventricular internal cavity size was normal in size. There is no left ventricular hypertrophy. Left ventricular diastolic parameters are indeterminate. Right Ventricle: The right ventricular size is normal. No increase in right ventricular wall thickness. Right ventricular systolic function is low normal. Left Atrium: Left atrial size was mildly dilated. Right Atrium: Right atrial size was normal in size. Pericardium: There is no evidence of pericardial effusion. Mitral Valve: The mitral valve is normal in structure. Mild to moderate mitral valve regurgitation. Tricuspid Valve: The tricuspid valve is normal in structure. Tricuspid valve regurgitation is mild. Aortic Valve: The aortic valve is tricuspid. Aortic valve regurgitation is not visualized. Aortic valve mean gradient measures 6.0 mmHg. Aortic valve peak gradient measures 11.4 mmHg. Aortic valve area, by VTI measures 1.64 cm. Pulmonic Valve: The pulmonic valve was normal in structure. Pulmonic valve regurgitation is not visualized. Aorta: The aortic root and ascending aorta are structurally normal, with no evidence of dilitation. Venous: The inferior vena cava is dilated in size with less than 50% respiratory variability, suggesting right atrial pressure of 15 mmHg. IAS/Shunts: No atrial level shunt detected by color flow Doppler.  LEFT VENTRICLE PLAX 2D                        Biplane EF (MOD) LVIDd:         4.50 cm         LV Biplane EF:   Left LVIDs:         3.70 cm                          ventricular LV PW:         1.00 cm  ejection LV IVS:        0.90 cm                          fraction by LVOT diam:     1.70 cm                          2D MOD LV SV:         53                               biplane is LV SV Index:   31                               41.5 %. LVOT Area:     2.27 cm                                Diastology                                LV e' medial:    7.40 cm/s LV Volumes (MOD)               LV E/e'  medial:  14.2 LV vol d, MOD    128.0 ml      LV e' lateral:   9.46 cm/s A2C:                           LV E/e' lateral: 11.1 LV vol d, MOD    159.0 ml A4C:                           2D LV vol s, MOD    72.5 ml       Longitudinal A2C:                           Strain LV vol s, MOD    94.7 ml       2D Strain GLS  -13.3 % A4C:                           Avg: LV SV MOD A2C:   55.5 ml LV SV MOD A4C:   159.0 ml LV SV MOD BP:    59.2 ml RIGHT VENTRICLE RV Basal diam:  3.80 cm RV S prime:     14.00 cm/s TAPSE (M-mode): 2.9 cm LEFT ATRIUM             Index        RIGHT ATRIUM           Index LA diam:        4.40 cm 2.56 cm/m   RA Area:     12.30 cm LA Vol (A2C):   71.3 ml 41.49 ml/m  RA Volume:   26.60 ml  15.48 ml/m LA Vol (A4C):   51.0 ml 29.68 ml/m LA Biplane Vol: 61.1 ml 35.56 ml/m  AORTIC VALVE  PULMONIC VALVE AV Area (Vmax):    1.46 cm      PV Vmax:       1.32 m/s AV Area (Vmean):   1.49 cm      PV Vmean:      97.900 cm/s AV Area (VTI):     1.64 cm      PV VTI:        0.295 m AV Vmax:           169.00 cm/s   PV Peak grad:  7.0 mmHg AV Vmean:          109.000 cm/s  PV Mean grad:  4.0 mmHg AV VTI:            0.323 m AV Peak Grad:      11.4 mmHg AV Mean Grad:      6.0 mmHg LVOT Vmax:         109.00 cm/s LVOT Vmean:        71.600 cm/s LVOT VTI:          0.233 m LVOT/AV VTI ratio: 0.72  AORTA Ao Root diam: 3.00 cm MITRAL VALVE MV Area (PHT): 3.61 cm     SHUNTS MV Decel Time: 210 msec     Systemic VTI:  0.23 m MV E velocity: 105.00 cm/s  Systemic Diam: 1.70 cm MV A velocity: 90.10 cm/s MV E/A ratio:  1.17 Kate Sable MD Electronically signed by Kate Sable MD Signature Date/Time: 10/25/2022/12:28:37 PM    Final      Assessment and plan- Patient is a 76 y.o. 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  Her most recent echocardiogram showed further drop in the EF to 40 to 45%.  I am therefore holding trastuzumab and perjeta at this time.  I will repeat echocardiogram in about 4 weeks time and see her thereafter.  If the EF normalizes again I will rechallenge her with trastuzumab and Perjeta.  However if it remains low I will refer her to cardiology.  I will also consider getting a repeat lymph node biopsy at that time to see if there is any change in the receptor status.  Her original biopsy showed ER 10%, PR negative and HER2 positive breast cancer and therefore I cannot put her on any hormone therapy at this time since it is unlikely that she will benefit from endocrine therapy and weakly ER positive disease.  Patient will be due for her next dose of Zometa in January 2023   Visit Diagnosis 1. Encounter for monitoring cardiotoxic drug therapy   2. Primary malignant neoplasm of breast with metastasis ([place])   3. High risk medication use      Dr. Randa Evens, MD, MPH [facility] [phone] 11/14/2022 10:18 AM

## 2022-11-14 NOTE — Progress Notes (Signed)
Pt noticed this morning that both of her hands are very red rt hand is redder than left, no pain pt believes it may be her neuropathy.

## 2022-11-15 ENCOUNTER — Encounter: Payer: Medicare Other | Attending: Internal Medicine | Admitting: Internal Medicine

## 2022-11-15 DIAGNOSIS — I872 Venous insufficiency (chronic) (peripheral): Secondary | ICD-10-CM | POA: Insufficient documentation

## 2022-11-15 DIAGNOSIS — I10 Essential (primary) hypertension: Secondary | ICD-10-CM | POA: Insufficient documentation

## 2022-11-15 DIAGNOSIS — C79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: Secondary | ICD-10-CM | POA: Insufficient documentation

## 2022-11-15 DIAGNOSIS — I87313 Chronic venous hypertension (idiopathic) with ulcer of bilateral lower extremity: Secondary | ICD-10-CM | POA: Insufficient documentation

## 2022-11-15 DIAGNOSIS — Z9221 Personal history of antineoplastic chemotherapy: Secondary | ICD-10-CM | POA: Diagnosis not present

## 2022-11-15 DIAGNOSIS — L97822 Non-pressure chronic ulcer of other part of left lower leg with fat layer exposed: Secondary | ICD-10-CM | POA: Diagnosis present

## 2022-11-15 MED ORDER — RIVAROXABAN 20 MG PO TABS: 20.0000 mg | ORAL_TABLET | Freq: Every day | ORAL | 5 refills | Status: DC

## 2022-11-15 MED ORDER — LORAZEPAM 0.5 MG PO TABS: 0.5000 mg | ORAL_TABLET | Freq: Every day | ORAL | 0 refills | Status: DC | PRN

## 2022-11-17 NOTE — Progress Notes (Signed)
Laura Santiago, Laura Santiago ([id number]) [id number] Page 1 of 14 Visit Report for 11/15/2022 Chief Complaint Document Details Patient Name: Date of Service: Laura Ace. 11/15/2022 10:30 A M Medical Record Number: [id number] Patient Account Number: [id number] Date of Birth/Sex: Treating RN: 1946-11-16 (76 y.o. Laura Santiago Primary Care Provider: [place] Other Clinician: [name] Referring Provider: Treating Provider/Extender: [name] Weeks in Treatment: 44 Information Obtained from: Patient Chief Complaint Left lower extremity wound Right toe wounds Left upper lateral thigh wounds Electronic Signature(s) Signed: 11/15/2022 12:06:37 PM By: Kalman Shan DO Entered By: Kalman Shan on 11/15/2022 12:02:32 -------------------------------------------------------------------------------- Debridement Details Patient Name: Date of Service: Laura Jewett [name] 11/15/2022 10:30 A M Medical Record Number: [id number] Patient Account Number: [id number] Date of Birth/Sex: Treating RN: 1946/11/04 (76 y.o. Laura Santiago Primary Care Provider: [place] Other Clinician: [name] Referring Provider: Treating Provider/Extender: [name] Weeks in Treatment: 25 Debridement Performed for Assessment: Wound #1 Left,Medial Lower Leg Performed By: Physician Kalman Shan, MD Debridement Type: Debridement Severity of Tissue Pre Debridement: Fat layer exposed Level of Consciousness (Pre-procedure): Awake and Alert Pre-procedure Verification/Time Out Yes - 11:24 Taken: Start Time: 11:24 Pain Control: Lidocaine 4% T opical Solution T Area Debrided (L x W): otal 5 (cm) x 0.9 (cm) = 4.5 (cm) Tissue and other material debrided: Viable, Non-Viable, Slough, Subcutaneous, Slough Level: Skin/Subcutaneous Tissue Debridement Description: Excisional Instrument:  Curette Bleeding: Minimum Hemostasis Achieved: Pressure Response to Treatment: Procedure was tolerated well Laura Santiago, TEMPLIN ([id number]) [id number] Page 2 of 14 Level of Consciousness (Santiago- Awake and Alert procedure): Santiago Debridement Measurements of Total Wound Length: (cm) 5 Width: (cm) 0.9 Depth: (cm) 0.2 Volume: (cm) 0.707 Character of Wound/Ulcer Santiago Debridement: Stable Severity of Tissue Santiago Debridement: Fat layer exposed Santiago Procedure Diagnosis Same as Pre-procedure Electronic Signature(s) Signed: 11/15/2022 12:06:37 PM By: Kalman Shan DO Signed: 11/15/2022 4:17:24 PM By: Rosalio Loud MSN RN CNS WTA Entered By: Rosalio Loud on 11/15/2022 11:25:27 -------------------------------------------------------------------------------- Debridement Details Patient Name: Date of Service: Laura Jewett [name] 11/15/2022 10:30 A M Medical Record Number: [id number] Patient Account Number: [id number] Date of Birth/Sex: Treating RN: [birth year] (76 y.o. Laura Santiago Primary Care Provider: [place] Other Clinician: [name] Referring Provider: Treating Provider/Extender: [name] Weeks in Treatment: 61 Debridement Performed for Assessment: Wound #8 Left,Lateral Lower Leg Performed By: Physician Kalman Shan, MD Debridement Type: Debridement Level of Consciousness (Pre-procedure): Awake and Alert Pre-procedure Verification/Time Out Yes - 11:24 Taken: Start Time: 11:24 Pain Control: Lidocaine 4% T opical Solution T Area Debrided (L x W): otal 2 (cm) x 1 (cm) = 2 (cm) Tissue and other material debrided: Viable, Non-Viable, Slough, Subcutaneous, Slough Level: Skin/Subcutaneous Tissue Debridement Description: Excisional Instrument: Curette Bleeding: Minimum Hemostasis Achieved: Pressure Response to Treatment: Procedure was tolerated well Level of Consciousness (Santiago- Awake and  Alert procedure): Santiago Debridement Measurements of Total Wound Length: (cm) 2 Stage: Category/Stage II Width: (cm) 1 Depth: (cm) 0.3 Volume: (cm) 0.471 Character of Wound/Ulcer Santiago Debridement: Stable Santiago Procedure Diagnosis Same as Pre-procedure Electronic Signature(s) Signed: 11/15/2022 12:06:37 PM By: Kalman Shan DO Signed: 11/15/2022 4:17:24 PM By: Rosalio Loud MSN RN CNS Laura Santiago, Laura Santiago ([id number]) [id number] Page 3 of 14 Entered By: Rosalio Loud on 11/15/2022 11:26:09 -------------------------------------------------------------------------------- HPI Details Patient Name: Date of Service: Laura Ace. 11/15/2022 10:30 A M Medical Record Number: [id number] Patient Account  Number: [id number] Date of Birth/Sex: Treating RN: 05-Jul-1946 (76 y.o. Laura Santiago Primary Care Provider: [place] Other Clinician: [name] Referring Provider: Treating Provider/Extender: Kalman Shan [facility] Weeks in Treatment: 27 History of Present Illness HPI Description: Admission 7/27 Ms. Miya Luviano is a 76 year old female with a past medical history of ADHD, metastatic breast cancer, stage IV chronic kidney disease, history of DVT on Xarelto and chronic venous insufficiency that presents to the clinic for a chronic left lower extremity wound. She recently moved to [facility] 4 days ago. She was being followed by wound care center in [name]. She reports a 10-year history of wounds to her left lower extremity that eventually do heal with debridement and compression therapy. She states that the current wound reopened 4 months ago and she is using Vaseline and Coban. She denies signs of infection. 8/3; patient presents for 1 week follow-up. She reports no issues or complaints today. She states she had vascular studies done in the last week. She denies signs of infection. She brought her little service  dog with her today. 8/17; patient presents for follow-up. She has missed her last clinic appointment. She states she took the wrap off and attempted to rewrap her leg. She is having difficulty with transportation. She has her service dog with her today. Overall she feels well and reports improvement in wound healing. She denies signs of infection. She reports owning an old Velcro wrap compression and has this at her living facility 9/14; patient presents for follow-up. Patient states that over the past 2 to 3 weeks she developed toe wounds to her right foot. She attributes this to tight fitting shoes. She subsequently developed cellulitis in the right leg and has been treated by doxycycline by her oncologist. She reports improvement in symptoms however continues to have some redness and swelling to this leg. T the left lower extremity patient has been having her wraps changed with home health twice weekly. She states that the [facility] is not helping control o the drainage. Other than that she has no issues or complaints today. She denies signs of infection to the left lower extremity. 9/21; patient presents for follow-up. She reports seeing infectious disease for her cellulitis. She reports no further management. She has home health that changes the wraps twice weekly. She has no issues or complaints today. She denies signs of infection. 10/5; patient presents for follow-up. She has no issues or complaints today. She denies signs of infection. She states that the right great toe has not been dressed by home health. 10/12; patient presents for follow-up. She has no issues or complaints today. She reports improvement in her wound healing. She has been using silver alginate to the right great toe wound. She denies signs of infection. 10/26; patient presents for follow-up. Home health did not have sorbact so they continued to use Hydrofera Blue under the wrap. She has been using silver alginate to  the great toe wound however she did not have a dressing in place today. She currently denies signs of infection. 11/2; patient presents for follow-up. She has been using sorb act under the compression wrap. She reports using silver alginate to the toe wound again she does not have a dressing in place. She currently denies signs of infection. 11/23; patient presents for follow-up. Unfortunately she has missed her last 2 clinic appointments. She was last seen 3 weeks ago. She did her own compression wrap with Kerlix and Coban  yesterday after seeing vein and vascular. She has not been dressing her right great toe wound. She currently denies signs of infection. 11/30; patient presents for 1 week follow-up. She states she changed her dressing last week prior to home health and use sorb act with Dakin's and Hydrofera Blue. Home health has changed the dressing as well and they have been using sorbact. T oday she reports increased redness to her right lower extremity. She has a history of cellulitis to this leg. She has been using silver alginate to the right great toe. Unfortunately she had an episode of diarrhea prior to coming in and had feces all over the right leg and to the wrap of her left leg. 12/7; patient presents for 1 week follow-up. She states that home health did not come out to change the dressing and she took it off yesterday. It is unclear if she is dressing the right toe wound. She denies signs of infection. 12/14; patient presents for 1 week follow-up. She has no issues or complaints today. 12/21; patient presents for follow-up. She has no issues or complaints today. She denies signs of infection. 12/28/2021; patient presents for follow-up. She was hospitalized for sepsis secondary to right lower extremity cellulitis On 12/23. She states she is currently at a SNF. She states that she was started on doxycycline this morning for her right great toe swelling and redness. She is not sure what  dressings have been done to her left lower extremity for the past 3 weeks. She says its been mainly gauze with an Ace wrap. 1/25; patient presents for follow-up. She is still residing in a skilled nursing facility. She reports mild pain to the left lower extremity wound bed. She states she is going to see a podiatrist soon. 2/8; patient presents for follow-up. She has moved back to her residential community from her skilled nursing facility. She has no issues or complaints today. Laura Santiago, Laura Santiago ([id number]) [id number] Page 4 of 14 She denies signs of systemic infections. 2/15; patient presents for follow-up. He has no issues or complaints today. She denies systemic signs of infection. 2/22; patient presents for follow-up. She has no issues or complaints today. She denies signs of infection. 3/1; patient presents for follow-up. She states that home health came out the day after she was seen in our clinic and yesterday to do the wrap change. She denies signs of infection. She reports excoriated skin on the ankle. 3/8; patient presents for follow-up. She has no issues or complaints today. She denies signs of infection. 3/15; patient presents for follow-up. Home health has been coming out to change the dressings. She reports more tenderness to the wound site. She denies purulent drainage, increased warmth or erythema to the area. 4/5; patient presents for follow-up. She has missed her last 2 clinic appointments. I have not seen her in 3 weeks. She was recently hospitalized for altered mental status. She was involuntarily committed. She was evaluated by psychiatry and deemed to have competency. There was no specific cause of her altered mental status. It was concluded that her physical and mental health were declining due to her chronic medical conditions. Currently home health has been coming out for dressing changes. Patient has also been doing her own dressing changes.  She reports more skin breakdown to the periwound and now has a new wound. She denies fever/chills. She reports continued tenderness to the wound site. 4/12; patient with significant venous insufficiency and a large wound on her left lower leg  taking up about 80% of the circumference of her lower leg. Cultures of this grew MRSA and Pseudomonas. She had completed a course of ciprofloxacin now is starting doxycycline. She has been using Dakin's wet-to-dry and a Tubigrip. She has home health twice a week and we change it once. 4/19; patient presents for follow-up. She completed her course of doxycycline. She has been using Dakin's wet-to-dry dressing and Tubigrip. Home health changes the dressing twice weekly. Currently she has no issues or complaints. 4/26; patient presents for follow-up. At last clinic visit orders for home health were Iodosorb under compression therapy. Unfortunately they did not have the dressing and have been using Dakin's and gentamicin under the wrap. Patient currently denies signs of infection. She has no issues or complaints today. 5/3; patient presents for follow-up. Again Iodosorb has not been used under the compression therapy when home health comes out to change the wrap and dressing. They have been using Sorbact. It is unclear why this is happening since we send orders weekly to the agency. She denies signs of infection. Patient has not purchased the [place] antibiotics. We reached out to the company and they said they have been trying to contact her on a regular basis. We gave the patient the number to call to order the medication. 5/10; patient presents for follow-up. She has no issues or complaints today. Again home health has not been using Iodosorb. Mepilex was on the wound bed. No other dressings noted. She brought in her Keystone antibiotics. She denies signs of infection. 5/17; patient presents for follow-up. Home health has come out twice since she was last seen.  Joint well she has been using Keystone antibiotic with Sorbact under the compression wrap. She has no issues or complaints today. She denies signs of infection. 5/24; patient presents for follow-up. We have been using Keystone antibiotics with Sorbact under compression therapy. She is tolerating the treatment well. She is reporting improvement in wound healing. She denies signs of infection. 5/31; patient presents for follow-up. We continue to do [facility] antibiotics with Sorbact under compression therapy. She continues to report improvement in wound healing. Home health comes out and changes the dressing once weekly. 05-17-2022 upon evaluation today patient appears to be doing better in regard to her wound especially compared to the last time I saw her. Fortunately I do think that she is seeing improvements. With that being said I do believe that she may be benefit from sharp debridement today to clear away some of the necrotic debris I discussed that with her as well. She is an amendable to that plan. Otherwise she is very pleased with how the [employer] is doing for her. 6/14; patient presents for follow-up. We have been using Keystone antibiotic with Sorbact and absorbent dressings under 3 layer compression. She has no issues or complaints today. She reports improvement in wound healing. She denies signs of infection. 6/21; patient presents for follow-up. We are continuing with [facility] antibiotic and Sorbact under 3 layer compression. Patient has no complaints. Continued wound healing is happening. She denies signs of infection. 6/28; patient presents for follow-up. We have been using Keystone antibiotic with Sorbact under 3 layer compression. Usually home health comes out and changes the dressing twice a week. Unfortunately they did not go out to change the dressing. It is unclear why. Patient did not call them. She currently denies signs of infection. 7/5; patient presents for follow-up. We have  been using Keystone antibiotic with calcium alginate under 3 layer compression.  She reports improvement in wound healing. She denies signs of infection. Home health has come out to do dressing changes twice this past week. 7/12; patient presents for follow-up. We have been using Keystone antibiotic with calcium alginate under 3 layer compression. Patient states that home health came out once last week to change the dressing. She reports improvement in wound healing. She currently denies signs of infection. 7/19; patient presents for follow-up. We have been using Keystone antibiotic with calcium alginate under 3 layer compression. Home health came out once last week to change the dressing. She has no issues or complaints today. She denies signs of infection. 8/2; patient presents for follow-up. We have been using Keystone antibiotic with calcium alginate under 3 layer compression. Unfortunately she missed her appointment last week and home health did not come out to do dressing changes. Patient currently denies signs of infection. 8/9; patient presents for follow-up. We have been using Keystone with calcium alginate under 3 layer compression. She states that home health came out once last week. She currently denies signs of infection. Her wrap was completely wet. She states she was cleaning the top of the leg and water soaked down into the wrap. 8/16; patient presents for follow-up. We have been using Keystone with calcium alginate under 3 layer compression. She states that home health came out twice last week. She has no issues or complaints today. 8/23; patient presents for follow-up. He has been using Keystone with calcium alginate under 3 layer compression. Home health came out twice last week. She denies signs of infection. 8/30; patient presents for follow-up. We have been using Keystone with calcium alginate under 3 layer compression. Home health came out once last week to change the dressing.  Patient reports improvement in wound healing. She states she is almost done with her chemotherapy infusions and has 1 more left. 9/13; patient presents for follow-up. She has lost the capsules to her [facility] antibiotic which I believe is the vancomycin pills. She has her Zosyn powder today. We have been using Keystone antibiotic ointment with calcium alginate under 3 layer compression. She is concerned about systemic infection however her vitals are stable and there is no surrounding soft tissue infection. She would like to remain a patient in our wound care center however would like a second opinion for her wound care at another facility. She asked to be referred to [facility] wound care center. 9/20; patient presents for follow-up. She found her vancomycin capsules and brought in her complete Keystone antibiotic ointment set today. Unfortunately CARESSE, SEDIVY ([id number]) [id number] Page 5 of 14 she has developed skin breakdown and Erythema to the right lower extremity With increased swelling. She states she went to a pow wow Over the weekend and was on her feet for extended periods of time. She saw her oncologist yesterday who prescribed her doxycycline for her right lower extremity erythema. 9/27; patient presents for follow-up. We have been using Keystone antibiotic with Aquacel under 3 layer compression to the lower extremities bilaterally. When home health came and changed the wrap she secretly put coffee into the spray mix along with [facility] antibiotic on her leg thinking the acidic component would better activate the zoysn (sonething she discussed with her microbiologist brother). She has reported improvement in wound healing. 10/4; patient presents for follow-up. She has no issues or complaints today. We have been doing Aquacel and keystone under 3 layer compression to the lower extremities bilaterally. This morning she took the right lower extremity wrap  off as it  was uncomfortable. She has no open wounds to this leg. 10/11; patient presents for follow-up. We have been doing Aquacel with Keystone antibiotic ointment under 3 layer compression to the left lower extremity. She developed a small blister to the anterior aspect of the left leg noticed when the wrap was taken off on intake. She currently denies signs of infection. 10/18; patient presents for follow-up. We have been doing Aquacel with Keystone antibiotic ointment under 3 layer compression to the left lower extremity. There has been continued improvement in wound healing. She denies signs of infection. 10/25; patient arrives for treatment of venous insufficiency ulcers on her left lower leg both lateral and medial are remanence of apparently a circumferential wound. Much improved. We are using topicals Keystone and Aquacel Ag under 3 layer compression we continue to make good progress. The patient talk to me at some length with regards to different things she has on her forehead and her Peri orbital area for which she is apparently applying [place]. She feels that what ever we are treating on her wounds is a more systemic problem. I really was not able to get a handle on what she is talking about however I did caution her not to put the [place] in her eyes. 11/1; her wounds continue to improve she is using Keystone and Aquacel Ag G under 3 layer compression. Our intake nurse notes erythema and edema in the right leg. The patient has a litany of concerns with regards to a rash on her forehead or ears and other systemic complaints. She has an appointment with dermatology on November 11 11/8; patient presents for follow-up. We have been using Keystone and Aquacel under 4-layer compression. She has no issues or complaints today. She reports improvement in wound healing. 11/15; patient presents for follow-up. We have been using Aquacel with Keystone antibiotic under 3 layer compression. Patient continues  improvement in wound healing. 12/6; patient presents for follow-up. We have been using Aquacel with Keystone antibiotic ointment under 3 layer compression. Wounds appear well-healing. Electronic Signature(s) Signed: 11/15/2022 12:06:37 PM By: Kalman Shan DO Entered By: Kalman Shan on 11/15/2022 12:03:08 -------------------------------------------------------------------------------- Physical Exam Details Patient Name: Date of Service: Laura Jewett [name] 11/15/2022 10:30 A M Medical Record Number: [id number] Patient Account Number: [id number] Date of Birth/Sex: Treating RN: 30-Oct-1946 (76 y.o. Laura Santiago Primary Care Provider: [place] Other Clinician: [name] Referring Provider: Treating Provider/Extender: [name] Weeks in Treatment: 82 Constitutional . Cardiovascular . Psychiatric . Notes Left lower extremity: T the distal medial and lateral aspect of the leg there are open wounds with granulation tissue and nonviable tissue. No surrounding signs o of infection. Venous stasis dermatitis. Adequate edema control. Electronic Signature(s) Signed: 11/15/2022 12:06:37 PM By: Kalman Shan DO Entered By: Kalman Shan on 11/15/2022 12:03:33 Laura Santiago ([id number]) [id number] Page 6 of 14 -------------------------------------------------------------------------------- Physician Orders Details Patient Name: Date of Service: Laura Ace. 11/15/2022 10:30 A M Medical Record Number: [id number] Patient Account Number: [id number] Date of Birth/Sex: Treating RN: 1946-04-03 (76 y.o. Laura Santiago Primary Care Provider: [place] Other Clinician: [name] Referring Provider: Treating Provider/Extender: [name] Weeks in Treatment: 79 Verbal / Phone Orders: No Diagnosis Coding ICD-10 Coding Code Description [phone] Non-pressure  chronic ulcer of other part of left lower leg with fat layer exposed 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 I87.311  Chronic venous hypertension (idiopathic) with ulcer of right lower extremity I87.2 Venous insufficiency (chronic) (peripheral) Z79.01 Long term (current) use of anticoagulants I10 Essential (primary) hypertension 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 Follow-up Appointments Return Appointment in 1 week. Nurse Visit as needed [place]: [facility] [place] for wound care. May utilize formulary equivalent dressing for wound treatment orders unless otherwise specified. Home Health Nurse may visit PRN to address patients wound care needs. **Please direct any NON-WOUND related issues/requests for orders to patient's Primary Care Physician. **If current dressing causes regression in wound condition, may D/C ordered dressing product/s and apply Normal Saline Moist Dressing daily until next [place] or Other MD appointment. **Notify [facility] of regression in wound condition at [phone]. Other Home Health Orders/Instructions: - Dressing change 3 x weekly, twice by home health and once at wound clinic weekly. Please make sure frequency between dressing changes is appropriate Bathing/ Shower/ Hygiene May shower with wound dressing protected with water repellent cover or cast protector. No tub bath. Anesthetic (Use 'Patient Medications' Section for Anesthetic Order Entry) Lidocaine applied to wound bed Edema Control - Lymphedema / Segmental Compressive Device / Other Optional: One layer of unna paste to top of compression wrap (to act as an anchor). - Please when applying wrap start from toes and go up just below the knee 4 Layer Compression System Lymphedema. - left lower leg Tubigrip single layer applied. - Tubi D right lower leg Elevate, Exercise Daily and A void  Standing for Long Periods of Time. Elevate legs to the level of the heart and pump ankles as often as possible Elevate leg(s) parallel to the floor when sitting. DO YOUR BEST to sleep in the bed at night. DO NOT sleep in your recliner. Long hours of sitting in a recliner leads to swelling of the legs and/or potential wounds on your backside. Additional Orders / Instructions Follow Nutritious Diet and Increase Protein Intake Medications-Please add to medication list. Keystone Compound Wound Treatment Wound #1 - Lower Leg Wound Laterality: Left, Medial Cleanser: Wound Cleanser 1 x Per Day/30 Days Discharge Instructions: Wash your hands with soap and water. Remove old dressing, discard into plastic bag and place into trash. Cleanse the wound with Wound Cleanser prior to applying a clean dressing using gauze sponges, not tissues or cotton balls. Do not scrub or use excessive Laura Santiago, Laura Santiago ([id number]) [id number] Page 7 of 14 force. Pat dry using gauze sponges, not tissue or cotton balls. Topical: Triamcinolone Acetonide Cream, 0.1%, 15 (g) tube 1 x Per Day/30 Days Discharge Instructions: Mix 1:1 with Nystatin cream and apply to periwound and leg Topical: Nystatin Cream, 15 (g) tube 1 x Per Day/30 Days Prim Dressing: Aquacel Extra Hydrofiber Dressing, 2x2 (in/in) ary 1 x Per Day/30 Days Secondary Dressing: Zetuvit Plus 4x8 (in/in) 1 x Per Day/30 Days Compression Wrap: Medichoice 4 layer Compression System, 35-40 mmHG 1 x Per Day/30 Days Discharge Instructions: Apply multi-layer wrap as directed. Wound #8 - Lower Leg Wound Laterality: Left, Lateral Cleanser: Wound Cleanser 1 x Per Day/30 Days Discharge Instructions: Wash your hands with soap and water. Remove old dressing, discard into plastic bag and place into trash. Cleanse the wound with Wound Cleanser prior to applying a clean dressing using gauze sponges, not tissues or cotton balls. Do not scrub or use  excessive force. Pat dry using gauze sponges, not tissue or cotton balls. Topical: Triamcinolone Acetonide Cream, 0.1%, 15 (g) tube 1 x Per Day/30 Days  Discharge Instructions: Mix 1:1 with Nystatin cream and apply to periwound and leg Topical: Nystatin Cream, 15 (g) tube 1 x Per Day/30 Days Prim Dressing: Aquacel Extra Hydrofiber Dressing, 2x2 (in/in) ary 1 x Per Day/30 Days Secondary Dressing: Zetuvit Plus 4x8 (in/in) 1 x Per Day/30 Days Compression Wrap: Medichoice 4 layer Compression System, 35-40 mmHG 1 x Per Day/30 Days Discharge Instructions: Apply multi-layer wrap as directed. Electronic Signature(s) Signed: 11/15/2022 3:52:35 PM By: Kalman Shan DO Signed: 11/17/2022 10:21:17 AM By: [name] Previous Signature: 11/15/2022 12:06:37 PM Version By: Kalman Shan DO Entered By: [name] on 11/15/2022 15:18:07 -------------------------------------------------------------------------------- Problem List Details Patient Name: Date of Service: Laura Jewett [name] 11/15/2022 10:30 A M Medical Record Number: [id number] Patient Account Number: [id number] Date of Birth/Sex: Treating RN: 11-15-1946 (76 y.o. Laura Santiago Primary Care Provider: [place] Other Clinician: [name] Referring Provider: Treating Provider/Extender: Kalman Shan [facility] Weeks in Treatment: 85 Active Problems ICD-10 Encounter Code Description Active Date MDM Diagnosis [phone] Non-pressure chronic ulcer of other part of left lower leg with fat layer exposed11/23/2022 No Yes 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 11/02/2021 No Yes TAREVA, LESKE ([id number]) [id number] Page 8 of 14 I87.311 Chronic venous hypertension (idiopathic) with ulcer of right lower extremity 08/30/2022 No Yes I87.2 Venous insufficiency (chronic) (peripheral) 07/06/2021 No Yes Z79.01 Long term (current) use of  anticoagulants 07/06/2021 No Yes I10 Essential (primary) hypertension 07/06/2021 No Yes 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 07/06/2021 No Yes Inactive Problems ICD-10 Code Description Active Date Inactive Date S81.802A Unspecified open wound, left lower leg, initial encounter 07/06/2021 07/06/2021 S91.101A Unspecified open wound of right great toe without damage to nail, initial encounter 08/24/2021 08/24/2021 S91.104A Unspecified open wound of right lesser toe(s) without damage to nail, initial encounter 08/24/2021 08/24/2021 Resolved Problems ICD-10 Code Description Active Date Resolved Date S91.104D Unspecified open wound of right lesser toe(s) without damage to nail, subsequent 08/31/2021 08/31/2021 encounter S91.201D Unspecified open wound of right great toe with damage to nail, subsequent encounter 08/31/2021 08/31/2021 Electronic Signature(s) Signed: 11/15/2022 12:06:37 PM By: Kalman Shan DO Entered By: Kalman Shan on 11/15/2022 12:02:28 -------------------------------------------------------------------------------- Progress Note Details Patient Name: Date of Service: Laura Jewett [name] 11/15/2022 10:30 A M Medical Record Number: [id number] Patient Account Number: [id number] Date of Birth/Sex: Treating RN: Jan 26, 1946 (76 y.o. Laura Santiago Primary Care Provider: [place] Other Clinician: [name] Referring Provider: Treating Provider/Extender: Kalman Shan [facility] Weeks in Treatment: [address], [place] M ([id number]) [id number] Page 9 of 14 Chief Complaint Information obtained from Patient Left lower extremity wound Right toe wounds Left upper lateral thigh wounds History of Present Illness (HPI) Admission 7/27 Ms. Paighton Godette is a 77 year old female with a past medical history of ADHD, metastatic breast cancer, stage IV chronic kidney disease, history of DVT on Xarelto and chronic  venous insufficiency that presents to the clinic for a chronic left lower extremity wound. She recently moved to [facility] 4 days ago. She was being followed by wound care center in [name]. She reports a 10-year history of wounds to her left lower extremity that eventually do heal with debridement and compression therapy. She states that the current wound reopened 4 months ago and she is using Vaseline and Coban. She denies signs of infection. 8/3; patient presents for 1 week follow-up. She reports no issues or complaints today. She states she had vascular studies done  in the last week. She denies signs of infection. She brought her little service dog with her today. 8/17; patient presents for follow-up. She has missed her last clinic appointment. She states she took the wrap off and attempted to rewrap her leg. She is having difficulty with transportation. She has her service dog with her today. Overall she feels well and reports improvement in wound healing. She denies signs of infection. She reports owning an old Velcro wrap compression and has this at her living facility 9/14; patient presents for follow-up. Patient states that over the past 2 to 3 weeks she developed toe wounds to her right foot. She attributes this to tight fitting shoes. She subsequently developed cellulitis in the right leg and has been treated by doxycycline by her oncologist. She reports improvement in symptoms however continues to have some redness and swelling to this leg. T the left lower extremity patient has been having her wraps changed with home health twice weekly. She states that the [facility] is not helping control o the drainage. Other than that she has no issues or complaints today. She denies signs of infection to the left lower extremity. 9/21; patient presents for follow-up. She reports seeing infectious disease for her cellulitis. She reports no further management. She has home health  that changes the wraps twice weekly. She has no issues or complaints today. She denies signs of infection. 10/5; patient presents for follow-up. She has no issues or complaints today. She denies signs of infection. She states that the right great toe has not been dressed by home health. 10/12; patient presents for follow-up. She has no issues or complaints today. She reports improvement in her wound healing. She has been using silver alginate to the right great toe wound. She denies signs of infection. 10/26; patient presents for follow-up. Home health did not have sorbact so they continued to use Hydrofera Blue under the wrap. She has been using silver alginate to the great toe wound however she did not have a dressing in place today. She currently denies signs of infection. 11/2; patient presents for follow-up. She has been using sorb act under the compression wrap. She reports using silver alginate to the toe wound again she does not have a dressing in place. She currently denies signs of infection. 11/23; patient presents for follow-up. Unfortunately she has missed her last 2 clinic appointments. She was last seen 3 weeks ago. She did her own compression wrap with Kerlix and Coban yesterday after seeing vein and vascular. She has not been dressing her right great toe wound. She currently denies signs of infection. 11/30; patient presents for 1 week follow-up. She states she changed her dressing last week prior to home health and use sorb act with Dakin's and Hydrofera Blue. Home health has changed the dressing as well and they have been using sorbact. T oday she reports increased redness to her right lower extremity. She has a history of cellulitis to this leg. She has been using silver alginate to the right great toe. Unfortunately she had an episode of diarrhea prior to coming in and had feces all over the right leg and to the wrap of her left leg. 12/7; patient presents for 1 week follow-up.  She states that home health did not come out to change the dressing and she took it off yesterday. It is unclear if she is dressing the right toe wound. She denies signs of infection. 12/14; patient presents for 1 week follow-up. She has  no issues or complaints today. 12/21; patient presents for follow-up. She has no issues or complaints today. She denies signs of infection. 12/28/2021; patient presents for follow-up. She was hospitalized for sepsis secondary to right lower extremity cellulitis On 12/23. She states she is currently at a SNF. She states that she was started on doxycycline this morning for her right great toe swelling and redness. She is not sure what dressings have been done to her left lower extremity for the past 3 weeks. She says its been mainly gauze with an Ace wrap. 1/25; patient presents for follow-up. She is still residing in a skilled nursing facility. She reports mild pain to the left lower extremity wound bed. She states she is going to see a podiatrist soon. 2/8; patient presents for follow-up. She has moved back to her residential community from her skilled nursing facility. She has no issues or complaints today. She denies signs of systemic infections. 2/15; patient presents for follow-up. He has no issues or complaints today. She denies systemic signs of infection. 2/22; patient presents for follow-up. She has no issues or complaints today. She denies signs of infection. 3/1; patient presents for follow-up. She states that home health came out the day after she was seen in our clinic and yesterday to do the wrap change. She denies signs of infection. She reports excoriated skin on the ankle. 3/8; patient presents for follow-up. She has no issues or complaints today. She denies signs of infection. 3/15; patient presents for follow-up. Home health has been coming out to change the dressings. She reports more tenderness to the wound site. She denies purulent drainage,  increased warmth or erythema to the area. 4/5; patient presents for follow-up. She has missed her last 2 clinic appointments. I have not seen her in 3 weeks. She was recently hospitalized for altered mental status. She was involuntarily committed. She was evaluated by psychiatry and deemed to have competency. There was no specific cause of her altered mental status. It was concluded that her physical and mental health were declining due to her chronic medical conditions. Currently home health has been coming out for dressing changes. Patient has also been doing her own dressing changes. She reports more skin breakdown to the periwound and now has a new wound. She denies fever/chills. She reports continued tenderness to the wound site. 4/12; patient with significant venous insufficiency and a large wound on her left lower leg taking up about 80% of the circumference of her lower leg. Cultures of this grew MRSA and Pseudomonas. She had completed a course of ciprofloxacin now is starting doxycycline. She has been using Dakin's wet-to-dry and a Tubigrip. She has home health twice a week and we change it once. 4/19; patient presents for follow-up. She completed her course of doxycycline. She has been using Dakin's wet-to-dry dressing and Tubigrip. Home health Laura Santiago, Laura Santiago ([id number]) [id number] Page 10 of 14 changes the dressing twice weekly. Currently she has no issues or complaints. 4/26; patient presents for follow-up. At last clinic visit orders for home health were Iodosorb under compression therapy. Unfortunately they did not have the dressing and have been using Dakin's and gentamicin under the wrap. Patient currently denies signs of infection. She has no issues or complaints today. 5/3; patient presents for follow-up. Again Iodosorb has not been used under the compression therapy when home health comes out to change the wrap and dressing. They have been using  Sorbact. It is unclear why this is happening since we send  orders weekly to the agency. She denies signs of infection. Patient has not purchased the [place] antibiotics. We reached out to the company and they said they have been trying to contact her on a regular basis. We gave the patient the number to call to order the medication. 5/10; patient presents for follow-up. She has no issues or complaints today. Again home health has not been using Iodosorb. Mepilex was on the wound bed. No other dressings noted. She brought in her Keystone antibiotics. She denies signs of infection. 5/17; patient presents for follow-up. Home health has come out twice since she was last seen. Joint well she has been using Keystone antibiotic with Sorbact under the compression wrap. She has no issues or complaints today. She denies signs of infection. 5/24; patient presents for follow-up. We have been using Keystone antibiotics with Sorbact under compression therapy. She is tolerating the treatment well. She is reporting improvement in wound healing. She denies signs of infection. 5/31; patient presents for follow-up. We continue to do [facility] antibiotics with Sorbact under compression therapy. She continues to report improvement in wound healing. Home health comes out and changes the dressing once weekly. 05-17-2022 upon evaluation today patient appears to be doing better in regard to her wound especially compared to the last time I saw her. Fortunately I do think that she is seeing improvements. With that being said I do believe that she may be benefit from sharp debridement today to clear away some of the necrotic debris I discussed that with her as well. She is an amendable to that plan. Otherwise she is very pleased with how the [employer] is doing for her. 6/14; patient presents for follow-up. We have been using Keystone antibiotic with Sorbact and absorbent dressings under 3 layer compression. She has no issues or  complaints today. She reports improvement in wound healing. She denies signs of infection. 6/21; patient presents for follow-up. We are continuing with [facility] antibiotic and Sorbact under 3 layer compression. Patient has no complaints. Continued wound healing is happening. She denies signs of infection. 6/28; patient presents for follow-up. We have been using Keystone antibiotic with Sorbact under 3 layer compression. Usually home health comes out and changes the dressing twice a week. Unfortunately they did not go out to change the dressing. It is unclear why. Patient did not call them. She currently denies signs of infection. 7/5; patient presents for follow-up. We have been using Keystone antibiotic with calcium alginate under 3 layer compression. She reports improvement in wound healing. She denies signs of infection. Home health has come out to do dressing changes twice this past week. 7/12; patient presents for follow-up. We have been using Keystone antibiotic with calcium alginate under 3 layer compression. Patient states that home health came out once last week to change the dressing. She reports improvement in wound healing. She currently denies signs of infection. 7/19; patient presents for follow-up. We have been using Keystone antibiotic with calcium alginate under 3 layer compression. Home health came out once last week to change the dressing. She has no issues or complaints today. She denies signs of infection. 8/2; patient presents for follow-up. We have been using Keystone antibiotic with calcium alginate under 3 layer compression. Unfortunately she missed her appointment last week and home health did not come out to do dressing changes. Patient currently denies signs of infection. 8/9; patient presents for follow-up. We have been using Keystone with calcium alginate under 3 layer compression. She states that home health came  out once last week. She currently denies signs of  infection. Her wrap was completely wet. She states she was cleaning the top of the leg and water soaked down into the wrap. 8/16; patient presents for follow-up. We have been using Keystone with calcium alginate under 3 layer compression. She states that home health came out twice last week. She has no issues or complaints today. 8/23; patient presents for follow-up. He has been using Keystone with calcium alginate under 3 layer compression. Home health came out twice last week. She denies signs of infection. 8/30; patient presents for follow-up. We have been using Keystone with calcium alginate under 3 layer compression. Home health came out once last week to change the dressing. Patient reports improvement in wound healing. She states she is almost done with her chemotherapy infusions and has 1 more left. 9/13; patient presents for follow-up. She has lost the capsules to her [facility] antibiotic which I believe is the vancomycin pills. She has her Zosyn powder today. We have been using Keystone antibiotic ointment with calcium alginate under 3 layer compression. She is concerned about systemic infection however her vitals are stable and there is no surrounding soft tissue infection. She would like to remain a patient in our wound care center however would like a second opinion for her wound care at another facility. She asked to be referred to [facility] wound care center. 9/20; patient presents for follow-up. She found her vancomycin capsules and brought in her complete Keystone antibiotic ointment set today. Unfortunately she has developed skin breakdown and Erythema to the right lower extremity With increased swelling. She states she went to a pow wow Over the weekend and was on her feet for extended periods of time. She saw her oncologist yesterday who prescribed her doxycycline for her right lower extremity erythema. 9/27; patient presents for follow-up. We have been using Keystone antibiotic with  Aquacel under 3 layer compression to the lower extremities bilaterally. When home health came and changed the wrap she secretly put coffee into the spray mix along with [facility] antibiotic on her leg thinking the acidic component would better activate the zoysn (sonething she discussed with her microbiologist brother). She has reported improvement in wound healing. 10/4; patient presents for follow-up. She has no issues or complaints today. We have been doing Aquacel and keystone under 3 layer compression to the lower extremities bilaterally. This morning she took the right lower extremity wrap off as it was uncomfortable. She has no open wounds to this leg. 10/11; patient presents for follow-up. We have been doing Aquacel with Keystone antibiotic ointment under 3 layer compression to the left lower extremity. She developed a small blister to the anterior aspect of the left leg noticed when the wrap was taken off on intake. She currently denies signs of infection. 10/18; patient presents for follow-up. We have been doing Aquacel with Keystone antibiotic ointment under 3 layer compression to the left lower extremity. There has been continued improvement in wound healing. She denies signs of infection. 10/25; patient arrives for treatment of venous insufficiency ulcers on her left lower leg both lateral and medial are remanence of apparently a circumferential wound. Much improved. We are using topicals Keystone and Aquacel Ag under 3 layer compression we continue to make good progress. The patient talk to me at some length with regards to different things she has on her forehead and her Peri orbital area for which she is apparently applying [place]. She feels that what ever we are  treating on her wounds is a more systemic problem. I really was not able to get a handle on what she is talking about however I did caution her not to put the [place] in her eyes. 11/1; her wounds continue to improve she is  using Keystone and Aquacel Ag G under 3 layer compression. Our intake nurse notes erythema and edema in the right leg. The patient has a litany of concerns with regards to a rash on her forehead or ears and other systemic complaints. She has an appointment with Laura Santiago, Laura Santiago ([id number]) [id number] Page 11 of 14 dermatology on November 11 11/8; patient presents for follow-up. We have been using Keystone and Aquacel under 4-layer compression. She has no issues or complaints today. She reports improvement in wound healing. 11/15; patient presents for follow-up. We have been using Aquacel with Keystone antibiotic under 3 layer compression. Patient continues improvement in wound healing. 12/6; patient presents for follow-up. We have been using Aquacel with Keystone antibiotic ointment under 3 layer compression. Wounds appear well-healing. Objective Constitutional Vitals Time Taken: 10:36 AM, Height: 66 in, Weight: 153 lbs, BMI: 24.7, Temperature: 97.9 F, Pulse: 89 bpm, Respiratory Rate: 18 breaths/min, Blood Pressure: 109/89 mmHg. General Notes: Left lower extremity: T the distal medial and lateral aspect of the leg there are open wounds with granulation tissue and nonviable tissue. No o surrounding signs of infection. Venous stasis dermatitis. Adequate edema control. Integumentary (Hair, Skin) Wound #1 status is Open. Original cause of wound was Gradually Appeared. The date acquired was: 04/06/2021. The wound has been in treatment 71 weeks. The wound is located on the Left,Medial Lower Leg. The wound measures 5cm length x 0.9cm width x 0.1cm depth; 3.534cm^2 area and 0.353cm^3 volume. There is Fat Layer (Subcutaneous Tissue) exposed. There is a medium amount of serosanguineous drainage noted. The wound margin is flat and intact. There is medium (34-66%) red, pink granulation within the wound bed. There is a medium (34-66%) amount of necrotic tissue within the wound  bed including Adherent Slough. Wound #8 status is Open. Original cause of wound was Pressure Injury. The date acquired was: 10/18/2022. The wound has been in treatment 4 weeks. The wound is located on the Left,Lateral Lower Leg. The wound measures 2cm length x 1cm width x 0.2cm depth; 1.571cm^2 area and 0.314cm^3 volume. There is Fat Layer (Subcutaneous Tissue) exposed. There is a medium amount of serosanguineous drainage noted. There is no granulation within the wound bed. There is a small (1-33%) amount of necrotic tissue within the wound bed including Adherent Slough. 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 I debrided nonviable tissue. I recommended continuing the course with [facility] antibiotic ointment and Aquacel under 3 layer compression. She states that her chemotherapy has been stopped due to her ejection fraction. 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 There was a Excisional Skin/Subcutaneous Tissue Debridement with a total area of 4.5 sq cm performed by Kalman Shan, MD. With the following instrument(s): Curette to remove Viable and Non-Viable tissue/material. Material removed includes Subcutaneous Tissue and Slough and after achieving pain  control using Lidocaine 4% T opical Solution. No specimens were taken. A time out was conducted at 11:24, prior to the start of the procedure. A Minimum amount of bleeding was controlled with  Pressure. The procedure was tolerated well. Santiago Debridement Measurements: 5cm length x 0.9cm width x 0.2cm depth; 0.707cm^3 volume. Character of Wound/Ulcer Santiago Debridement is stable. Severity of Tissue Santiago Debridement is: Fat layer exposed. Santiago procedure Diagnosis Wound #1: Same as Pre-Procedure Wound #8 Pre-procedure diagnosis of Wound #8 is a Pressure Ulcer located on the Left,Lateral Lower Leg . There was a Excisional Skin/Subcutaneous Tissue Debridement with a total area of 2 sq cm performed by Kalman Shan, MD. With the following instrument(s): Curette to remove Viable and Non-Viable tissue/material. Material removed includes Subcutaneous Tissue and Slough and after achieving pain control using Lidocaine 4% T opical Solution. No specimens were taken. A time out was conducted at 11:24, prior to the start of the procedure. A Minimum amount of bleeding was controlled with Pressure. The procedure was tolerated well. Santiago Debridement Measurements: 2cm length x 1cm width x 0.3cm depth; 0.471cm^3 volume. Santiago debridement Stage noted as Category/Stage II. Character of Wound/Ulcer Santiago Debridement is stable. Santiago procedure Diagnosis Wound #8: Same as Pre-Procedure Laura Santiago, Laura Santiago ([id number]) [id number] Page 12 of 14 Plan 1. In office sharp debridement 2. Aquacel Ag with [facility] antibiotic ointment under 3 layer compression 3. Follow-up in 1 week Electronic Signature(s) Signed: 11/15/2022 12:06:37 PM By: Kalman Shan DO Entered By: Kalman Shan on 11/15/2022 12:05:33 -------------------------------------------------------------------------------- ROS/PFSH Details Patient Name: Date of Service: Laura Santiago, [place]. 11/15/2022 10:30 A M Medical Record Number: [id number] Patient Account Number: [id number] Date of Birth/Sex: Treating RN: 1946/02/04 (76 y.o. Laura Santiago Primary Care Provider: [place] Other Clinician: [name] Referring Provider: Treating Provider/Extender: [name] Weeks in Treatment: 37 Information Obtained From Patient Eyes Medical History: Negative for: Cataracts; Glaucoma; Optic Neuritis Ear/Nose/Mouth/Throat Medical History: Negative for: Chronic sinus problems/congestion; Middle ear problems Hematologic/Lymphatic Medical History: Negative for: Anemia; Hemophilia; Human Immunodeficiency Virus; Lymphedema; Sickle Cell Disease Respiratory Medical History: Negative for: Aspiration; Asthma; Chronic Obstructive Pulmonary Disease (COPD); Pneumothorax; Sleep Apnea; Tuberculosis Cardiovascular Medical History: Positive for: Hypertension Negative for: Angina; Arrhythmia; Congestive Heart Failure; Coronary Artery Disease; Deep Vein Thrombosis; Hypotension; Myocardial Infarction; Peripheral Arterial Disease; Peripheral Venous Disease; Phlebitis; Vasculitis Gastrointestinal Medical History: Negative for: Cirrhosis ; Colitis; Crohns; Hepatitis A; Hepatitis B; Hepatitis C Endocrine VINNIE, BOBST ([id number]) [id number] Page 13 of 14 Medical History: Negative for: Type I Diabetes; Type II Diabetes Genitourinary Medical History: Negative for: End Stage Renal Disease Immunological Medical History: Negative for: Lupus Erythematosus; Raynauds; Scleroderma Integumentary (Skin) Medical History: Negative for: History of Burn; History of pressure wounds Musculoskeletal Medical History: Positive for: Osteoarthritis Negative for: Gout; Rheumatoid Arthritis; Osteomyelitis Oncologic Medical History: Positive for: Received Chemotherapy; Received Radiation Past Medical History Notes: breast cancer Immunizations Pneumococcal Vaccine: Received Pneumococcal Vaccination: No Implantable Devices None Family and Social History Never smoker Electronic Signature(s) Signed: 11/15/2022 12:06:37 PM By: Kalman Shan DO Signed:  11/15/2022 4:17:47 PM By: Carlene Coria RN Entered By: Kalman Shan on 11/15/2022 12:06:08 -------------------------------------------------------------------------------- SuperBill Details Patient Name: Date of Service: Laura Jewett [name] 11/15/2022 Medical Record Number: [id number] Patient Account Number: [id number] Date of Birth/Sex: Treating RN: [birth year] (76 y.o. Laura Santiago Primary Care Provider: [place] Other Clinician: [name] Referring Provider: Treating Provider/Extender: [name] Weeks in Treatment: 73 Diagnosis Coding  ICD-10 Codes Code Description [phone] Non-pressure chronic ulcer of other part of left lower leg with fat layer exposed Laura Santiago, Laura Santiago ([id number]) [id number] Page 14 of 14 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 I87.311 Chronic venous hypertension (idiopathic) with ulcer of right lower extremity I87.2 Venous insufficiency (chronic) (peripheral) Z79.01 Long term (current) use of anticoagulants I10 Essential (primary) hypertension 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 Facility Procedures : CPT4 Code: [id number] Description: 89784 - DEB SUBQ TISSUE 20 SQ CM/< ICD-10 Diagnosis Description 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 Modifier: sed Quantity: 1 Physician Procedures : CPT4 Code Description Modifier [id number] 11042 - WC PHYS SUBQ TISS 20 SQ CM ICD-10 Diagnosis Description 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sed Quantity: 1 Electronic Signature(s) Signed: 11/15/2022 12:06:37 PM By: Kalman Shan DO Entered By: Kalman Shan on 11/15/2022 12:05:48

## 2022-11-21 ENCOUNTER — Other Ambulatory Visit: Payer: Self-pay | Admitting: Oncology

## 2022-11-21 MED ORDER — FENTANYL 25 MCG/HR TD PT72: MEDICATED_PATCH | TRANSDERMAL | 0 refills | Status: DC

## 2022-11-22 ENCOUNTER — Encounter (HOSPITAL_BASED_OUTPATIENT_CLINIC_OR_DEPARTMENT_OTHER): Payer: Medicare Other | Admitting: Internal Medicine

## 2022-11-22 DIAGNOSIS — L97822 Non-pressure chronic ulcer of other part of left lower leg with fat layer exposed: Secondary | ICD-10-CM

## 2022-11-22 DIAGNOSIS — I87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: Secondary | ICD-10-CM

## 2022-11-22 DIAGNOSIS — Z7901 Long term (current) use of anticoagulants: Secondary | ICD-10-CM | POA: Diagnosis not present

## 2022-11-22 DIAGNOSIS — C79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: Secondary | ICD-10-CM

## 2022-11-22 DIAGNOSIS — I87313 Chronic venous hypertension (idiopathic) with ulcer of bilateral lower extremity: Secondary | ICD-10-CM | POA: Diagnosis not present

## 2022-11-22 NOTE — Progress Notes (Signed)
ARIONNA, HOGGARD ([id number]) [id number] Page 1 of 10 Visit Report for 11/15/2022 Arrival Information Details Patient Name: Date of Service: Laura Santiago. 11/15/2022 10:30 A M Medical Record Number: [id number] Patient Account Number: [id number] Date of Birth/Sex: Treating RN: 23-Jul-1946 (76 y.o. [name] Primary Care Jermia Rigsby: [place] Other Clinician: [name] Referring Tonna Palazzi: Treating Demetria Lightsey/Extender: [name] Weeks in Treatment: 76 Visit Information History Since Last Visit Added or deleted any medications: No Patient Arrived: [name] Any new allergies or adverse reactions: No Arrival Time: 10:31 Had a fall or experienced change in No Accompanied By: self activities of daily living that may affect Transfer Assistance: None risk of falls: Patient Identification Verified: Yes Hospitalized since last visit: No Secondary Verification Process Completed: Yes Pain Present Now: No Patient Requires Transmission-Based No Precautions: Patient Has Alerts: Yes Patient Alerts: PT HAS SERVICE ANIMAL ABI 07/11/21 R) 1.16 L) 1.27 Electronic Signature(s) Signed: 11/15/2022 4:17:24 PM By: Rosalio Loud MSN RN CNS WTA Entered By: Rosalio Loud on 11/15/2022 10:36:10 -------------------------------------------------------------------------------- Clinic Level of Care Assessment Details Patient Name: Date of Service: Laura Jewett [name] 11/15/2022 10:30 A M Medical Record Number: [id number] Patient Account Number: [id number] Date of Birth/Sex: Treating RN: Dec 05, 1946 (76 y.o. Laura Santiago Primary Care Carmeron Heady: [place] Other Clinician: [name] Referring Kieara Schwark: Treating Nesreen Albano/Extender: [name] Weeks in Treatment: 73 Clinic Level of Care Assessment Items TOOL 1 Quantity Score _0  - 0 Use when EandM and Procedure is performed on INITIAL  visit ASSESSMENTS - Nursing Assessment / Reassessment _1  - 0 General Physical Exam (combine w/ comprehensive assessment (listed just below) when performed on new pt. evals) _2  - 0 Comprehensive Assessment (HX, ROS, Risk Assessments, Wounds Hx, etc.) MACHELLE, RAYBON ([id number]) [id number] Page 2 of 10 ASSESSMENTS - Wound and Skin Assessment / Reassessment _3  - 0 Dermatologic / Skin Assessment (not related to wound area) ASSESSMENTS - Ostomy and/or Continence Assessment and Care _4  - 0 Incontinence Assessment and Management _5  - 0 Ostomy Care Assessment and Management (repouching, etc.) PROCESS - Coordination of Care _6  - 0 Simple Patient / Family Education for ongoing care _7  - 0 Complex (extensive) Patient / Family Education for ongoing care _8  - 0 Staff obtains Programmer, systems, Records, T Results / Process Orders est _9  - 0 Staff telephones HHA, Nursing Homes / Clarify orders / etc _10  - 0 Routine Transfer to another Facility (non-emergent condition) _11  - 0 Routine Hospital Admission (non-emergent condition) _12  - 0 New Admissions / Biomedical engineer / Ordering NPWT Apligraf, etc. , _13  - 0 Emergency Hospital Admission (emergent condition) PROCESS - Special Needs _14  - 0 Pediatric / Minor Patient Management _15  - 0 Isolation Patient Management _16  - 0 Hearing / Language / Visual special needs _17  - 0 Assessment of Community assistance (transportation, D/C planning, etc.) _18  - 0 Additional assistance / Altered mentation _19  - 0 Support Surface(s) Assessment (bed, cushion, seat, etc.) INTERVENTIONS - Miscellaneous _20  - 0 External ear exam _21  - 0 Patient Transfer (multiple staff / Civil Service fast streamer / Similar devices) _22  - 0 Simple Staple / Suture removal (25 or less) _23  - 0 Complex Staple / Suture removal (26 or more) _24  - 0 Hypo/Hyperglycemic Management (do not check if billed separately) _25  - 0 Ankle / Brachial Index (ABI) - do not check if  billed separately Has the patient been seen at the hospital within the last three years: Yes Total Score:  0 Level Of Care: ____ Electronic Signature(s) Signed: 11/17/2022 10:21:17 AM By: [name] Entered By: [name] on 11/15/2022 15:18:13 -------------------------------------------------------------------------------- Compression Therapy Details Patient Name: Date of Service: Laura Jewett [name] 11/15/2022 10:30 A M Medical Record Number: [id number] Patient Account Number: [id number] Date of Birth/Sex: Treating RN: 17-Mar-1946 (76 y.o. Laura Santiago Primary Care Tniyah Nakagawa: [place] Other Clinician: [name] Referring Forney Kleinpeter: Treating Antia Rahal/Extender: [name] Weeks in Treatment: [address] [place] M ([id number]) [id number] Page 3 of 10 Compression Therapy Performed for Wound Assessment: Wound #1 Left,Medial Lower Leg Performed By: [name], [name], Compression Type: Four Layer Pre Treatment ABI: 1.5 Post Procedure Diagnosis Same as Pre-procedure Electronic Signature(s) Signed: 11/17/2022 10:21:17 AM By: [name] Entered By: [name] on 11/15/2022 15:17:50 -------------------------------------------------------------------------------- Encounter Discharge Information Details Patient Name: Date of Service: Laura Jewett [name] 11/15/2022 10:30 A M Medical Record Number: [id number] Patient Account Number: [id number] Date of Birth/Sex: Treating RN: [birth year] (76 y.o. Laura Santiago Primary Care Laura Santiago: [place] Other Clinician: [name] Referring Laverda Stribling: Treating Pauleen Goleman/Extender: [name] Weeks in Treatment: 69 Encounter Discharge Information Items Post Procedure Vitals Discharge Condition: Stable Temperature (F): 97.9 Ambulatory Status: Walker Pulse (bpm): 89 Discharge Destination: Home Respiratory Rate  (breaths/min): 18 Transportation: Other Blood Pressure (mmHg): 109/89 Accompanied By: self Schedule Follow-up Appointment: Yes Clinical Summary of Care: Electronic Signature(s) Signed: 11/17/2022 10:21:17 AM By: [name] Entered By: [name] on 11/15/2022 15:45:52 -------------------------------------------------------------------------------- Lower Extremity Assessment Details Patient Name: Date of Service: Laura Jewett [name] 11/15/2022 10:30 A M Medical Record Number: [id number] Patient Account Number: [id number] Date of Birth/Sex: Treating RN: August 23, 1946 (76 y.o. [name] Primary Care Kristine Tiley: [place] Other Clinician: [name] Referring Uriyah Raska: Treating Swayze Pries/Extender: Kalman Shan [facility] [place] in Treatment: 55 Edema Assessment L[LeftARNESHIA, ADE ([id number])] [Right: [id number] Page 4 of 10] Assessed: [Left: No] [Right: No] [Left: Edema] [Right: :] Calf Left: Right: Point of Measurement: From Medial Instep 32 cm Ankle Left: Right: Point of Measurement: From Medial Instep 22 cm Vascular Assessment Pulses: Dorsalis Pedis Palpable: [Left:Yes] Electronic Signature(s) Signed: 11/15/2022 4:17:24 PM By: Rosalio Loud MSN RN CNS WTA Entered By: Rosalio Loud on 11/15/2022 10:53:43 -------------------------------------------------------------------------------- Multi Wound Chart Details Patient Name: Date of Service: Laura Jewett [name] 11/15/2022 10:30 A M Medical Record Number: [id number] Patient Account Number: [id number] Date of Birth/Sex: Treating RN: 03-23-1946 (76 y.o. [name] Primary Care Lasean Rahming: [place] Other Clinician: [name] Referring Feleica Fulmore: Treating Simrat Kendrick/Extender: [name] Weeks in Treatment: 67 Vital Signs Height(in): 66 Pulse(bpm): 89 Weight(lbs): 153 Blood Pressure(mmHg): 109/89 Body Mass  Index(BMI): 24.7 Temperature(F): 97.9 Respiratory Rate(breaths/min): 18 [1:Photos:] [N/A:N/A] Left, Medial Lower Leg Left, Lateral Lower Leg N/A Wound Location: Gradually Appeared Pressure Injury N/A Wounding Event: Venous Leg Ulcer Pressure Ulcer N/A Primary Etiology: Hypertension, Osteoarthritis, ReceivedHypertension, Osteoarthritis, ReceivedN/A Comorbid History: Chemotherapy, Received Radiation Chemotherapy, Received Radiation 04/06/2021 10/18/2022 N/A Date Acquired: 71 4 N/A Weeks of Treatment: Open Open N/A Wound Status: No No N/A Wound Recurrence: Yes No N/A Clustered Wound: 5x0.9x0.1 2x1x0.2 N/A Measurements L x W x D (cm) 3.534 1.571 N/A A (cm) : MADELENA, MATURIN ([id number]) [id number] Page 5 of 10 0.353 0.314 N/A Volume (cm) : 96.10% 62.20% N/A % Reduction in Area: 98.00% 62.20% N/A % Reduction in Volume: Full Thickness Without Exposed Category/Stage II N/A Classification:  Support Structures Medium Medium N/A Exudate Amount: Serosanguineous Serosanguineous N/A Exudate Type: red, brown red, brown N/A Exudate Color: Flat and Intact N/A N/A Wound Margin: Medium (34-66%) None Present (0%) N/A Granulation Amount: Red, Pink N/A N/A Granulation Quality: Medium (34-66%) Small (1-33%) N/A Necrotic Amount: Fat Layer (Subcutaneous Tissue): Yes Fat Layer (Subcutaneous Tissue): Yes N/A Exposed Structures: Fascia: No Fascia: No Tendon: No Tendon: No Muscle: No Muscle: No Joint: No Joint: No Bone: No Bone: No Small (1-33%) None N/A Epithelialization: Treatment Notes Electronic Signature(s) Signed: 11/15/2022 4:17:24 PM By: Rosalio Loud MSN RN CNS WTA Entered By: Rosalio Loud on 11/15/2022 10:56:19 -------------------------------------------------------------------------------- Multi-Disciplinary Care Plan Details Patient Name: Date of Service: [name], Laura Santiago. 11/15/2022 10:30 A M Medical Record Number:  [id number] Patient Account Number: [id number] Date of Birth/Sex: Treating RN: [birth year] (76 y.o. Laura Santiago Primary Care Colman Birdwell: [place] Other Clinician: [name] Referring Carlon Davidson: Treating Oswin Griffith/Extender: [name] Weeks in Treatment: 28 Active Inactive Necrotic Tissue Nursing Diagnoses: Impaired tissue integrity related to necrotic/devitalized tissue Knowledge deficit related to management of necrotic/devitalized tissue Goals: Necrotic/devitalized tissue will be minimized in the wound bed Date Initiated: 07/06/2021 Target Resolution Date: 12/13/2022 Goal Status: Active Patient/caregiver will verbalize understanding of reason and process for debridement of necrotic tissue Date Initiated: 07/06/2021 Date Inactivated: 10/05/2021 Target Resolution Date: 07/06/2021 Goal Status: Met Interventions: Assess patient pain level pre-, during and post procedure and prior to discharge Provide education on necrotic tissue and debridement process Treatment Activities: Apply topical anesthetic as ordered : 07/06/2021 Biologic debridement : 07/06/2021 Enzymatic debridement : 07/06/2021 Excisional debridement : 07/06/2021 QUINCIE, HAROON ([id number]) [id number] Page 6 of 10 Notes: Soft Tissue Infection Nursing Diagnoses: Impaired tissue integrity Potential for infection: soft tissue Goals: Patient's soft tissue infection will resolve Date Initiated: 03/15/2022 Target Resolution Date: 12/13/2022 Goal Status: Active Signs and symptoms of infection will be recognized early to allow for prompt treatment Date Initiated: 03/15/2022 Target Resolution Date: 12/13/2022 Goal Status: Active Interventions: Assess signs and symptoms of infection every visit Treatment Activities: Culture and sensitivity : 03/15/2022 Notes: Electronic Signature(s) Signed: 11/15/2022 4:24:27 PM By: Gretta Cool, BSN, RN, CWS, [name] RN, BSN Signed:  11/21/2022 4:34:43 PM By: Carlene Coria RN Previous Signature: 11/15/2022 4:22:45 PM Version By: Gretta Cool, BSN, RN, CWS, [name] RN, BSN Previous Signature: 11/15/2022 4:17:47 PM Version By: Carlene Coria RN Entered By: Gretta Cool, BSN, RN, CWS, [name] on 11/15/2022 16:24:27 -------------------------------------------------------------------------------- Pain Assessment Details Patient Name: Date of Service: Laura Jewett [name] 11/15/2022 10:30 A M Medical Record Number: [id number] Patient Account Number: [id number] Date of Birth/Sex: Treating RN: 06-01-1946 (76 y.o. [name] Primary Care Clydie Dillen: [place] Other Clinician: [name] Referring Azeez Dunker: Treating Aaryanna Hyden/Extender: Kalman Shan [facility] DLE [place] in Treatment: 63 Active Problems Location of Pain Severity and Description of Pain Patient Has Paino No Site Locations Laura Santiago, Laura Santiago [place] ([id number]) [id number] Page 7 of 10 Pain Management and Medication Current Pain Management: Electronic Signature(s) Signed: 11/15/2022 4:17:24 PM By: Rosalio Loud MSN RN CNS WTA Entered By: Rosalio Loud on 11/15/2022 10:43:06 -------------------------------------------------------------------------------- Patient/Caregiver Education Details Patient Name: Date of Service: Laura Jewett [name] 12/6/2023andnbsp10:30 [place] Record Number: [id number] Patient Account Number: [id number] Date of Birth/Gender: Treating RN: [birth year] (76 y.o. Laura Santiago Primary Care Physician: [place] Other Clinician: [name] Referring Physician: Treating Physician/Extender: Hoffman, [place] Weeks in Treatment: 73 Education Assessment Education Provided To: Patient  Education Topics Provided Wound/Skin Impairment: Handouts: Other: continue wound care as directed Methods: Explain/Verbal Responses: State content correctly Electronic Signature(s) Signed:  11/17/2022 10:21:17 AM By: [name] Entered By: [name] on 11/15/2022 15:41:31 -------------------------------------------------------------------------------- Wound Assessment Details Patient Name: Date of Service: Laura Jewett [name] 11/15/2022 10:30 A M Medical Record Number: [id number] Patient Account Number: [id number] Date of Birth/Sex: Treating RN: Jan 11, 1946 (76 y.o. [name] Primary Care Laura Santiago: [place] Other Clinician: [name] Referring Norvell Ureste: Treating Huston Stonehocker/Extender: Kalman Shan [facility] DLE [place] Weeks in Treatment: 54 Wound Status Wound Number: 1 Primary Venous Leg Ulcer Etiology: Laura Santiago, Laura Santiago ([id number]) [id number] Page 8 of 10 Etiology: Wound Location: Left, Medial Lower Leg Wound Status: Open Wounding Event: Gradually Appeared Comorbid Hypertension, Osteoarthritis, Received Chemotherapy, Date Acquired: 04/06/2021 History: Received Radiation Weeks Of Treatment: 71 Clustered Wound: Yes Photos Wound Measurements Length: (cm) 5 Width: (cm) 0.9 Depth: (cm) 0.1 Area: (cm) 3.534 Volume: (cm) 0.353 % Reduction in Area: 96.1% % Reduction in Volume: 98% Epithelialization: Small (1-33%) Wound Description Classification: Full Thickness Without Exposed Support Structures Wound Margin: Flat and Intact Exudate Amount: Medium Exudate Type: Serosanguineous Exudate Color: red, brown Foul Odor After Cleansing: No Slough/Fibrino Yes Wound Bed Granulation Amount: Medium (34-66%) Exposed Structure Granulation Quality: Red, Pink Fascia Exposed: No Necrotic Amount: Medium (34-66%) Fat Layer (Subcutaneous Tissue) Exposed: Yes Necrotic Quality: Adherent Slough Tendon Exposed: No Muscle Exposed: No Joint Exposed: No Bone Exposed: No Treatment Notes Wound #1 (Lower Leg) Wound Laterality: Left, Medial Cleanser Wound Cleanser Discharge Instruction: Wash your hands with soap and water.  Remove old dressing, discard into plastic bag and place into trash. Cleanse the wound with Wound Cleanser prior to applying a clean dressing using gauze sponges, not tissues or cotton balls. Do not scrub or use excessive force. Pat dry using gauze sponges, not tissue or cotton balls. Peri-Wound Care Topical Triamcinolone Acetonide Cream, 0.1%, 15 (g) tube Discharge Instruction: Mix 1:1 with Nystatin cream and apply to periwound and leg Nystatin Cream, 15 (g) tube Primary Dressing Aquacel Extra Hydrofiber Dressing, 2x2 (in/in) Secondary Dressing Zetuvit Plus 4x8 (in/in) Secured With Compression Wrap Medichoice 4 layer Compression System, 35-40 mmHG Discharge Instruction: Apply multi-layer wrap as directed. Compression Stockings Add-Ons STACEE, EARP ([id number]) [id number] Page 9 of 10 Electronic Signature(s) Signed: 11/15/2022 4:17:24 PM By: Rosalio Loud MSN RN CNS WTA Entered By: Rosalio Loud on 11/15/2022 10:52:24 -------------------------------------------------------------------------------- Wound Assessment Details Patient Name: Date of Service: Laura Jewett [name] 11/15/2022 10:30 A M Medical Record Number: [id number] Patient Account Number: [id number] Date of Birth/Sex: Treating RN: 1946/12/06 (76 y.o. [name] Primary Care Dwayn Morave[place]: [place] Other Clinician: [name] Referring Joyous Gleghorn: Treating Kamira Mellette/Extender: [name] Weeks in Treatment: 76 Wound Status Wound Number: 8 Primary Pressure Ulcer Etiology: Wound Location: Left, Lateral Lower Leg Wound Status: Open Wounding Event: Pressure Injury Comorbid Hypertension, Osteoarthritis, Received Chemotherapy, Date Acquired: 10/18/2022 History: Received Radiation Weeks Of Treatment: 4 Clustered Wound: No Photos Wound Measurements Length: (cm) 2 Width: (cm) 1 Depth: (cm) 0.2 Area: (cm) 1.571 Volume: (cm) 0.314 % Reduction in  Area: 62.2% % Reduction in Volume: 62.2% Epithelialization: None Wound Description Classification: Category/Stage II Exudate Amount: Medium Exudate Type: Serosanguineous Exudate Color: red, brown Foul Odor After Cleansing: No Slough/Fibrino Yes Wound Bed Granulation Amount: None Present (0%) Exposed Structure Necrotic Amount: Small (1-33%) Fascia Exposed: No Necrotic Quality: Adherent Slough Fat Layer (Subcutaneous Tissue) Exposed: Yes Tendon Exposed: No Muscle Exposed:  No Joint Exposed: No Bone Exposed: No Treatment Notes SYANNE, LOONEY ([id number]) [id number] Page 10 of 10 Wound #8 (Lower Leg) Wound Laterality: Left, Lateral Cleanser Wound Cleanser Discharge Instruction: Wash your hands with soap and water. Remove old dressing, discard into plastic bag and place into trash. Cleanse the wound with Wound Cleanser prior to applying a clean dressing using gauze sponges, not tissues or cotton balls. Do not scrub or use excessive force. Pat dry using gauze sponges, not tissue or cotton balls. Peri-Wound Care Topical Triamcinolone Acetonide Cream, 0.1%, 15 (g) tube Discharge Instruction: Mix 1:1 with Nystatin cream and apply to periwound and leg Nystatin Cream, 15 (g) tube Primary Dressing Aquacel Extra Hydrofiber Dressing, 2x2 (in/in) Secondary Dressing Zetuvit Plus 4x8 (in/in) Secured With Compression Wrap Medichoice 4 layer Compression System, 35-40 mmHG Discharge Instruction: Apply multi-layer wrap as directed. Compression Stockings Add-Ons Electronic Signature(s) Signed: 11/15/2022 4:17:24 PM By: Rosalio Loud MSN RN CNS WTA Entered By: Rosalio Loud on 11/15/2022 10:52:49 -------------------------------------------------------------------------------- Vitals Details Patient Name: Date of Service: [name], Laura Santiago. 11/15/2022 10:30 A M Medical Record Number: [id number] Patient Account Number: [id number] Date of Birth/Sex: Treating  RN: Oct 18, 1946 (76 y.o. [name] Primary Care Ellagrace Yoshida: [place] Other Clinician: [name] Referring Lailyn Appelbaum: Treating Cope Marte/Extender: [name] Weeks in Treatment: 103 Vital Signs Time Taken: 10:36 Temperature (F): 97.9 Height (in): 66 Pulse (bpm): 89 Weight (lbs): 153 Respiratory Rate (breaths/min): 18 Body Mass Index (BMI): 24.7 Blood Pressure (mmHg): 109/89 Reference Range: 80 - 120 mg / dl Electronic Signature(s) Signed: 11/15/2022 4:17:24 PM By: Rosalio Loud MSN RN CNS WTA Entered By: Rosalio Loud on 11/15/2022 10:42:19

## 2022-11-22 NOTE — Progress Notes (Signed)
Laura, Santiago ([id number]) [id number] Page 1 of 13 Visit Report for 11/22/2022 Chief Complaint Document Details Patient Name: Date of Service: Laura [place]. 11/22/2022 10:30 A [place] Medical Record Number: [id number] Patient Account Number: [id number] Date of Birth/Sex: Treating RN: January 11, 1946 (76 y.o. Laura [place] Primary Care Provider: [place] Other Clinician: Massie [place] Referring Provider: Treating Provider/Extender: Laura [facility] Weeks in Treatment: 43 Information Obtained from: Patient Chief Complaint Left lower extremity wound Right toe wounds Left upper lateral thigh wounds Electronic Signature(s) Signed: 11/22/2022 11:46:52 AM By: Laura [place] Entered By: Laura [place] on 11/22/2022 11:43:41 -------------------------------------------------------------------------------- HPI Details Patient Name: Date of Service: Laura Santiago NNIE [place]. 11/22/2022 10:30 A [place] Medical Record Number: [id number] Patient Account Number: [id number] Date of Birth/Sex: Treating RN: 06/21/1946 (76 y.o. Laura [place] Primary Care Provider: [place] Other Clinician: Massie [place] Referring Provider: Treating Provider/Extender: Laura Santiago [facility] Weeks in Treatment: 55 History of Present Illness HPI Description: Admission 7/27 Ms. Laura [place] is a 76 year old female with a past medical history of ADHD, metastatic breast cancer, stage IV chronic kidney disease, history of DVT on Xarelto and chronic venous insufficiency that presents to the clinic for a chronic left lower extremity wound. She recently moved to [facility] 4 days ago. She was being followed by wound care center in [place]. She reports a 10-year history of wounds to her left lower extremity that eventually [place] heal with debridement and compression therapy. She states that the current wound reopened 4  months ago and she is using Vaseline and Coban. She denies signs of infection. 8/3; patient presents for 1 week follow-up. She reports no issues or complaints today. She states she had vascular studies done in the last week. She denies signs of infection. She brought her little service dog with her today. 8/17; patient presents for follow-up. She has missed her last clinic appointment. She states she took the wrap off and attempted to rewrap her leg. She is having difficulty with transportation. She has her service dog with her today. Overall she feels well and reports improvement in wound healing. She denies signs of infection. She reports owning an old Velcro wrap compression and has this at her living facility 9/14; patient presents for follow-up. Patient states that over the past 2 to 3 weeks she developed toe wounds to her right foot. She attributes this to tight fitting Laura, [place] ([id number]) [id number] Page 2 of 13 shoes. She subsequently developed cellulitis in the right leg and has been treated by doxycycline by her oncologist. She reports improvement in symptoms however continues to have some redness and swelling to this leg. T the left lower extremity patient has been having her wraps changed with home health twice weekly. She states that the [facility] is not helping control o the drainage. Other than that she has no issues or complaints today. She denies signs of infection to the left lower extremity. 9/21; patient presents for follow-up. She reports seeing infectious disease for her cellulitis. She reports no further management. She has home health that changes the wraps twice weekly. She has no issues or complaints today. She denies signs of infection. 10/5; patient presents for follow-up. She has no issues or complaints today. She denies signs of infection. She states that the right great toe has not been dressed by home health. 10/12; patient  presents for follow-up. She has no issues or complaints today.  She reports improvement in her wound healing. She has been using silver alginate to the right great toe wound. She denies signs of infection. 10/26; patient presents for follow-up. Home health did not have sorbact so they continued to use Hydrofera Blue under the wrap. She has been using silver alginate to the great toe wound however she did not have a dressing in place today. She currently denies signs of infection. 11/2; patient presents for follow-up. She has been using sorb act under the compression wrap. She reports using silver alginate to the toe wound again she does not have a dressing in place. She currently denies signs of infection. 11/23; patient presents for follow-up. Unfortunately she has missed her last 2 clinic appointments. She was last seen 3 weeks ago. She did her own compression wrap with Kerlix and Coban yesterday after seeing vein and vascular. She has not been dressing her right great toe wound. She currently denies signs of infection. 11/30; patient presents for 1 week follow-up. She states she changed her dressing last week prior to home health and use sorb act with Dakin's and Hydrofera Blue. Home health has changed the dressing as well and they have been using sorbact. T oday she reports increased redness to her right lower extremity. She has a history of cellulitis to this leg. She has been using silver alginate to the right great toe. Unfortunately she had an episode of diarrhea prior to coming in and had feces all over the right leg and to the wrap of her left leg. 12/7; patient presents for 1 week follow-up. She states that home health did not come out to change the dressing and she took it off yesterday. It is unclear if she is dressing the right toe wound. She denies signs of infection. 12/14; patient presents for 1 week follow-up. She has no issues or complaints today. 12/21; patient presents for  follow-up. She has no issues or complaints today. She denies signs of infection. 12/28/2021; patient presents for follow-up. She was hospitalized for sepsis secondary to right lower extremity cellulitis On 12/23. She states she is currently at a SNF. She states that she was started on doxycycline this morning for her right great toe swelling and redness. She is not sure what dressings have been done to her left lower extremity for the past 3 weeks. She says its been mainly gauze with an [place] wrap. 1/25; patient presents for follow-up. She is still residing in a skilled nursing facility. She reports mild pain to the left lower extremity wound bed. She states she is going to see a podiatrist soon. 2/8; patient presents for follow-up. She has moved back to her residential community from her skilled nursing facility. She has no issues or complaints today. She denies signs of systemic infections. 2/15; patient presents for follow-up. He has no issues or complaints today. She denies systemic signs of infection. 2/22; patient presents for follow-up. She has no issues or complaints today. She denies signs of infection. 3/1; patient presents for follow-up. She states that home health came out the day after she was seen in our clinic and yesterday to [place] the wrap change. She denies signs of infection. She reports excoriated skin on the ankle. 3/8; patient presents for follow-up. She has no issues or complaints today. She denies signs of infection. 3/15; patient presents for follow-up. Home health has been coming out to change the dressings. She reports more tenderness to the wound site. She denies purulent drainage, increased warmth or erythema  to the area. 4/5; patient presents for follow-up. She has missed her last 2 clinic appointments. I have not seen her in 3 weeks. She was recently hospitalized for altered mental status. She was involuntarily committed. She was evaluated by psychiatry and deemed to have  competency. There was no specific cause of her altered mental status. It was concluded that her physical and mental health were declining due to her chronic medical conditions. Currently home health has been coming out for dressing changes. Patient has also been doing her own dressing changes. She reports more skin breakdown to the periwound and now has a new wound. She denies fever/chills. She reports continued tenderness to the wound site. 4/12; patient with significant venous insufficiency and a large wound on her left lower leg taking up about 80% of the circumference of her lower leg. Cultures of this grew MRSA and Pseudomonas. She had completed a course of ciprofloxacin now is starting doxycycline. She has been using Dakin's wet-to-dry and a Tubigrip. She has home health twice a week and we change it once. 4/19; patient presents for follow-up. She completed her course of doxycycline. She has been using Dakin's wet-to-dry dressing and Tubigrip. Home health changes the dressing twice weekly. Currently she has no issues or complaints. 4/26; patient presents for follow-up. At last clinic visit orders for home health were Iodosorb under compression therapy. Unfortunately they did not have the dressing and have been using Dakin's and gentamicin under the wrap. Patient currently denies signs of infection. She has no issues or complaints today. 5/3; patient presents for follow-up. Again Iodosorb has not been used under the compression therapy when home health comes out to change the wrap and dressing. They have been using Sorbact. It is unclear why this is happening since we send orders weekly to the agency. She denies signs of infection. Patient has not purchased the [place] antibiotics. We reached out to the company and they said they have been trying to contact her on a regular basis. We gave the patient the number to call to order the medication. 5/10; patient presents for follow-up. She has no  issues or complaints today. Again home health has not been using Iodosorb. Mepilex was on the wound bed. No other dressings noted. She brought in her Keystone antibiotics. She denies signs of infection. 5/17; patient presents for follow-up. Home health has come out twice since she was last seen. Joint well she has been using Keystone antibiotic with Sorbact under the compression wrap. She has no issues or complaints today. She denies signs of infection. 5/24; patient presents for follow-up. We have been using Keystone antibiotics with Sorbact under compression therapy. She is tolerating the treatment well. She is reporting improvement in wound healing. She denies signs of infection. 5/31; patient presents for follow-up. We continue to [place] [facility] antibiotics with Sorbact under compression therapy. She continues to report improvement in wound healing. Home health comes out and changes the dressing once weekly. 05-17-2022 upon evaluation today patient appears to be doing better in regard to her wound especially compared to the last time I saw her. Fortunately I Santiago think Laura Santiago, Laura Santiago ([id number]) [id number] Page 3 of 13 that she is seeing improvements. With that being said I [place] believe that she may be benefit from sharp debridement today to clear away some of the necrotic debris I discussed that with her as well. She is an amendable to that plan. Otherwise she is very pleased with how the [employer] is doing for her.  6/14; patient presents for follow-up. We have been using Keystone antibiotic with Sorbact and absorbent dressings under 3 layer compression. She has no issues or complaints today. She reports improvement in wound healing. She denies signs of infection. 6/21; patient presents for follow-up. We are continuing with [facility] antibiotic and Sorbact under 3 layer compression. Patient has no complaints. Continued wound healing is happening. She denies signs of  infection. 6/28; patient presents for follow-up. We have been using Keystone antibiotic with Sorbact under 3 layer compression. Usually home health comes out and changes the dressing twice a week. Unfortunately they did not go out to change the dressing. It is unclear why. Patient did not call them. She currently denies signs of infection. 7/5; patient presents for follow-up. We have been using Keystone antibiotic with calcium alginate under 3 layer compression. She reports improvement in wound healing. She denies signs of infection. Home health has come out to [place] dressing changes twice this past week. 7/12; patient presents for follow-up. We have been using Keystone antibiotic with calcium alginate under 3 layer compression. Patient states that home health came out once last week to change the dressing. She reports improvement in wound healing. She currently denies signs of infection. 7/19; patient presents for follow-up. We have been using Keystone antibiotic with calcium alginate under 3 layer compression. Home health came out once last week to change the dressing. She has no issues or complaints today. She denies signs of infection. 8/2; patient presents for follow-up. We have been using Keystone antibiotic with calcium alginate under 3 layer compression. Unfortunately she missed her appointment last week and home health did not come out to [place] dressing changes. Patient currently denies signs of infection. 8/9; patient presents for follow-up. We have been using Keystone with calcium alginate under 3 layer compression. She states that home health came out once last week. She currently denies signs of infection. Her wrap was completely wet. She states she was cleaning the top of the leg and water soaked down into the wrap. 8/16; patient presents for follow-up. We have been using Keystone with calcium alginate under 3 layer compression. She states that home health came out twice last week. She has no  issues or complaints today. 8/23; patient presents for follow-up. He has been using Keystone with calcium alginate under 3 layer compression. Home health came out twice last week. She denies signs of infection. 8/30; patient presents for follow-up. We have been using Keystone with calcium alginate under 3 layer compression. Home health came out once last week to change the dressing. Patient reports improvement in wound healing. She states she is almost done with her chemotherapy infusions and has 1 more left. 9/13; patient presents for follow-up. She has lost the capsules to her [facility] antibiotic which I believe is the vancomycin pills. She has her Zosyn powder today. We have been using Keystone antibiotic ointment with calcium alginate under 3 layer compression. She is concerned about systemic infection however her vitals are stable and there is no surrounding soft tissue infection. She would like to remain a patient in our wound care center however would like a second opinion for her wound care at another facility. She asked to be referred to [facility] wound care center. 9/20; patient presents for follow-up. She found her vancomycin capsules and brought in her complete Keystone antibiotic ointment set today. Unfortunately she has developed skin breakdown and Erythema to the right lower extremity With increased swelling. She states she went to a [employer]  Over the weekend and was on her feet for extended periods of time. She saw her oncologist yesterday who prescribed her doxycycline for her right lower extremity erythema. 9/27; patient presents for follow-up. We have been using Keystone antibiotic with Aquacel under 3 layer compression to the lower extremities bilaterally. When home health came and changed the wrap she secretly put coffee into the spray mix along with [facility] antibiotic on her leg thinking the acidic component would better activate the zoysn (sonething she discussed with her  microbiologist brother). She has reported improvement in wound healing. 10/4; patient presents for follow-up. She has no issues or complaints today. We have been doing Aquacel and keystone under 3 layer compression to the lower extremities bilaterally. This morning she took the right lower extremity wrap off as it was uncomfortable. She has no open wounds to this leg. 10/11; patient presents for follow-up. We have been doing Aquacel with Keystone antibiotic ointment under 3 layer compression to the left lower extremity. She developed a small blister to the anterior aspect of the left leg noticed when the wrap was taken off on intake. She currently denies signs of infection. 10/18; patient presents for follow-up. We have been doing Aquacel with Keystone antibiotic ointment under 3 layer compression to the left lower extremity. There has been continued improvement in wound healing. She denies signs of infection. 10/25; patient arrives for treatment of venous insufficiency ulcers on her left lower leg both lateral and medial are remanence of apparently a circumferential wound. Much improved. We are using topicals Keystone and Aquacel Ag under 3 layer compression we continue to make good progress. The patient talk to me at some length with regards to different things she has on her forehead and her Peri orbital area for which she is apparently applying Peculiar. She feels that what ever we are treating on her wounds is a more systemic problem. I really was not able to get a handle on what she is talking about however I did caution her not to put the [place] in her eyes. 11/1; her wounds continue to improve she is using Keystone and Aquacel Ag G under 3 layer compression. Our intake nurse notes erythema and edema in the right leg. The patient has a litany of concerns with regards to a rash on her forehead or ears and other systemic complaints. She has an appointment with dermatology on November 11 11/8;  patient presents for follow-up. We have been using Keystone and Aquacel under 4-layer compression. She has no issues or complaints today. She reports improvement in wound healing. 11/15; patient presents for follow-up. We have been using Aquacel with Keystone antibiotic under 3 layer compression. Patient continues improvement in wound healing. 12/6; patient presents for follow-up. We have been using Aquacel with Keystone antibiotic ointment under 3 layer compression. Wounds appear well-healing. 12/13; patient presents for follow-up. We have been using Aquacel with Keystone antibiotic under 3 layer compression. She has no issues or complaints today. Electronic Signature(s) Signed: 11/22/2022 11:46:52 AM By: Laura [place] Entered By: Laura [place] on 11/22/2022 11:44:04 Laura [place] ([id number]) [id number] Page 4 of 13 -------------------------------------------------------------------------------- Physical Exam Details Patient Name: Date of Service: Laura [place]. 11/22/2022 10:30 A [place] Medical Record Number: [id number] Patient Account Number: [id number] Date of Birth/Sex: Treating RN: January 11, 1946 (76 y.o. Laura [place] Primary Care Provider: [place] Other Clinician: Massie [place] Referring Provider: Treating Provider/Extender: Laura [place] [facility] Weeks in Treatment: 53 Constitutional . Cardiovascular .  Psychiatric . Notes Left lower extremity: T the distal medial lateral aspect there are open wounds with granulation tissue throughout. No surrounding signs of infection. Venous o stasis dermatitis. Adequate edema control. Electronic Signature(s) Signed: 11/22/2022 11:46:52 AM By: Laura [place] Entered By: Laura [place] on 11/22/2022 11:44:40 -------------------------------------------------------------------------------- Physician Orders Details Patient Name: Date of Service: Laura [place] NNIE  [place]. 11/22/2022 10:30 A [place] Medical Record Number: [id number] Patient Account Number: [id number] Date of Birth/Sex: Treating RN: 1946-06-02 (76 y.o. Laura [place] Primary Care Provider: [place] Other Clinician: Massie [place] Referring Provider: Treating Provider/Extender: Laura [place] [facility] Weeks in Treatment: 67 Verbal / Phone Orders: No Diagnosis Coding Follow-up Appointments Return Appointment in 1 week. Nurse Visit as needed [place]: - [place] for wound care. May utilize formulary equivalent dressing for wound treatment orders unless otherwise specified. Home Health Nurse may visit PRN to address patients wound care needs. **Please direct any NON-WOUND related issues/requests for orders to patient's Primary Care Physician. **If current dressing causes regression in wound condition, may D/C ordered dressing product/s and apply Normal Saline Moist Dressing daily until next [place] or Other MD appointment. **Notify Wound Healing Center of regression in wound condition at [phone]. Other Home Health Orders/Instructions: - Dressing change 3 x weekly, twice by home health and once at wound clinic weekly. Please make sure Laura Santiago, Laura Santiago ([id number]) [id number] Page 5 of 13 frequency between dressing changes is appropriate Bathing/ Shower/ Hygiene May shower with wound dressing protected with water repellent cover or cast protector. No tub bath. Anesthetic (Use 'Patient Medications' Section for Anesthetic Order Entry) Lidocaine applied to wound bed Edema Control - Lymphedema / Segmental Compressive Device / Other Optional: One layer of unna paste to top of compression wrap (to act as an anchor). - Please when applying wrap start from toes and go up just below the knee 4 Layer Compression System Lymphedema. - left lower leg Tubigrip single layer applied. - Tubi D  right lower leg Elevate, Exercise Daily and A void Standing for Long Periods of Time. Elevate legs to the level of the heart and pump ankles as often as possible Elevate leg(s) parallel to the floor when sitting. [place] YOUR BEST to sleep in the bed at night. [place] NOT sleep in your recliner. Long hours of sitting in a recliner leads to swelling of the legs and/or potential wounds on your backside. Additional Orders / Instructions Follow Nutritious Diet and Increase Protein Intake Medications-Please add to medication list. Keystone Compound Wound Treatment Wound #1 - Lower Leg Wound Laterality: Left, Medial Cleanser: Wound Cleanser 1 x Per Day/30 Days Discharge Instructions: Wash your hands with soap and water. Remove old dressing, discard into plastic bag and place into trash. Cleanse the wound with Wound Cleanser prior to applying a clean dressing using gauze sponges, not tissues or cotton balls. [place] not scrub or use excessive force. Pat dry using gauze sponges, not tissue or cotton balls. Topical: Triamcinolone Acetonide Cream, 0.1%, 15 (g) tube 1 x Per Day/30 Days Discharge Instructions: Mix 1:1 with Nystatin cream and apply to periwound and leg Topical: Nystatin Cream, 15 (g) tube 1 x Per Day/30 Days Prim Dressing: Aquacel Extra Hydrofiber Dressing, 2x2 (in/in) ary 1 x Per Day/30 Days Secondary Dressing: Zetuvit Plus 4x8 (in/in) 1 x Per Day/30 Days Compression Wrap: Medichoice 4 layer Compression System, 35-40 mmHG 1 x Per Day/30 Days Discharge Instructions: Apply multi-layer wrap as directed. Wound #  8 - Lower Leg Wound Laterality: Left, Lateral Cleanser: Wound Cleanser 1 x Per Day/30 Days Discharge Instructions: Wash your hands with soap and water. Remove old dressing, discard into plastic bag and place into trash. Cleanse the wound with Wound Cleanser prior to applying a clean dressing using gauze sponges, not tissues or cotton balls. [place] not scrub or use excessive force. Pat dry using  gauze sponges, not tissue or cotton balls. Topical: Triamcinolone Acetonide Cream, 0.1%, 15 (g) tube 1 x Per Day/30 Days Discharge Instructions: Mix 1:1 with Nystatin cream and apply to periwound and leg Topical: Nystatin Cream, 15 (g) tube 1 x Per Day/30 Days Prim Dressing: Aquacel Extra Hydrofiber Dressing, 2x2 (in/in) ary 1 x Per Day/30 Days Secondary Dressing: Zetuvit Plus 4x8 (in/in) 1 x Per Day/30 Days Electronic Signature(s) Signed: 11/22/2022 11:46:52 AM By: Laura [place] Entered By: Laura [place] on 11/22/2022 11:46:12 Laura [place] ([id number]) [id number] Page 6 of 13 -------------------------------------------------------------------------------- Problem List Details Patient Name: Date of Service: Laura [place]. 11/22/2022 10:30 A [place] Medical Record Number: [id number] Patient Account Number: [id number] Date of Birth/Sex: Treating RN: 1946/12/06 (76 y.o. Laura [place] Primary Care Provider: [place] Other Clinician: Massie [place] Referring Provider: Treating Provider/Extender: Laura Santiago [facility] Weeks in Treatment: 72 Active Problems ICD-10 Encounter Code Description Active Date MDM Diagnosis [phone] Non-pressure chronic ulcer of other part of left lower leg with fat layer exposed11/23/2022 No Yes 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 11/02/2021 No Yes I87.311 Chronic venous hypertension (idiopathic) with ulcer of right lower extremity 08/30/2022 No Yes I87.2 Venous insufficiency (chronic) (peripheral) 07/06/2021 No Yes Z79.01 Long term (current) use of anticoagulants 07/06/2021 No Yes I10 Essential (primary) hypertension 07/06/2021 No Yes 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 07/06/2021 No Yes Inactive Problems ICD-10 Code Description Active Date Inactive Date S81.802A Unspecified open wound, left lower leg, initial encounter 07/06/2021  07/06/2021 S91.101A Unspecified open wound of right great toe without damage to nail, initial encounter 08/24/2021 08/24/2021 S91.104A Unspecified open wound of right lesser toe(s) without damage to nail, initial encounter 08/24/2021 08/24/2021 Resolved Problems ICD-10 Code Description Active Date Resolved Date S91.104D Unspecified open wound of right lesser toe(s) without damage to nail, subsequent 08/31/2021 08/31/2021 encounter Laura Santiago, Laura Santiago ([id number]) [id number] Page 7 of 13 S91.201D Unspecified open wound of right great toe with damage to nail, subsequent encounter 08/31/2021 08/31/2021 Electronic Signature(s) Signed: 11/22/2022 11:46:52 AM By: Laura Santiago Entered By: Laura Santiago on 11/22/2022 11:43:38 -------------------------------------------------------------------------------- Progress Note Details Patient Name: Date of Service: Laura Santiago NNIE [place]. 11/22/2022 10:30 A [place] Medical Record Number: [id number] Patient Account Number: [id number] Date of Birth/Sex: Treating RN: [birth year] (76 y.o. Laura [place] Primary Care Provider: [place] Other Clinician: Massie [place] Referring Provider: Treating Provider/Extender: Laura [place] [facility] Weeks in Treatment: 72 Subjective Chief Complaint Information obtained from Patient Left lower extremity wound Right toe wounds Left upper lateral thigh wounds History of Present Illness (HPI) Admission 7/27 Ms. Quanetta Truss is a 76 year old female with a past medical history of ADHD, metastatic breast cancer, stage IV chronic kidney disease, history of DVT on Xarelto and chronic venous insufficiency that presents to the clinic for a chronic left lower extremity wound. She recently moved to [facility] 4 days ago. She was being followed by wound care center in [place]. She reports a 10-year history of wounds to her left lower extremity that eventually [place] heal  with debridement and compression therapy. She states that the current wound reopened 4 months ago and she is using Vaseline and Coban. She denies signs of infection. 8/3; patient presents for 1 week follow-up. She reports no issues or complaints today. She states she had vascular studies done in the last week. She denies signs of infection. She brought her little service dog with her today. 8/17; patient presents for follow-up. She has missed her last clinic appointment. She states she took the wrap off and attempted to rewrap her leg. She is having difficulty with transportation. She has her service dog with her today. Overall she feels well and reports improvement in wound healing. She denies signs of infection. She reports owning an old Velcro wrap compression and has this at her living facility 9/14; patient presents for follow-up. Patient states that over the past 2 to 3 weeks she developed toe wounds to her right foot. She attributes this to tight fitting shoes. She subsequently developed cellulitis in the right leg and has been treated by doxycycline by her oncologist. She reports improvement in symptoms however continues to have some redness and swelling to this leg. T the left lower extremity patient has been having her wraps changed with home health twice weekly. She states that the [facility] is not helping control o the drainage. Other than that she has no issues or complaints today. She denies signs of infection to the left lower extremity. 9/21; patient presents for follow-up. She reports seeing infectious disease for her cellulitis. She reports no further management. She has home health that changes the wraps twice weekly. She has no issues or complaints today. She denies signs of infection. 10/5; patient presents for follow-up. She has no issues or complaints today. She denies signs of infection. She states that the right great toe has not been dressed by home health. 10/12;  patient presents for follow-up. She has no issues or complaints today. She reports improvement in her wound healing. She has been using silver alginate to the right great toe wound. She denies signs of infection. 10/26; patient presents for follow-up. Home health did not have sorbact so they continued to use Hydrofera Blue under the wrap. She has been using silver alginate to the great toe wound however she did not have a dressing in place today. She currently denies signs of infection. 11/2; patient presents for follow-up. She has been using sorb act under the compression wrap. She reports using silver alginate to the toe wound again she does not have a dressing in place. She currently denies signs of infection. 11/23; patient presents for follow-up. Unfortunately she has missed her last 2 clinic appointments. She was last seen 3 weeks ago. She did her own compression wrap with Kerlix and Coban yesterday after seeing vein and vascular. She has not been dressing her right great toe wound. She currently denies signs of infection. 11/30; patient presents for 1 week follow-up. She states she changed her dressing last week prior to home health and use sorb act with Dakin's and Hydrofera Blue. Home health has changed the dressing as well and they have been using sorbact. T oday she reports increased redness to her right lower extremity. She has a history of cellulitis to this leg. She has been using silver alginate to the right great toe. Unfortunately she had an episode of diarrhea prior to coming in [place] ([id number]) [id number] Page 8 of 13 and had feces all over the right leg and to the  wrap of her left leg. 12/7; patient presents for 1 week follow-up. She states that home health did not come out to change the dressing and she took it off yesterday. It is unclear if she is dressing the right toe wound. She denies signs of infection. 12/14; patient presents for 1  week follow-up. She has no issues or complaints today. 12/21; patient presents for follow-up. She has no issues or complaints today. She denies signs of infection. 12/28/2021; patient presents for follow-up. She was hospitalized for sepsis secondary to right lower extremity cellulitis On 12/23. She states she is currently at a SNF. She states that she was started on doxycycline this morning for her right great toe swelling and redness. She is not sure what dressings have been done to her left lower extremity for the past 3 weeks. She says its been mainly gauze with an [place] wrap. 1/25; patient presents for follow-up. She is still residing in a skilled nursing facility. She reports mild pain to the left lower extremity wound bed. She states she is going to see a podiatrist soon. 2/8; patient presents for follow-up. She has moved back to her residential community from her skilled nursing facility. She has no issues or complaints today. She denies signs of systemic infections. 2/15; patient presents for follow-up. He has no issues or complaints today. She denies systemic signs of infection. 2/22; patient presents for follow-up. She has no issues or complaints today. She denies signs of infection. 3/1; patient presents for follow-up. She states that home health came out the day after she was seen in our clinic and yesterday to [place] the wrap change. She denies signs of infection. She reports excoriated skin on the ankle. 3/8; patient presents for follow-up. She has no issues or complaints today. She denies signs of infection. 3/15; patient presents for follow-up. Home health has been coming out to change the dressings. She reports more tenderness to the wound site. She denies purulent drainage, increased warmth or erythema to the area. 4/5; patient presents for follow-up. She has missed her last 2 clinic appointments. I have not seen her in 3 weeks. She was recently hospitalized for altered mental status. She  was involuntarily committed. She was evaluated by psychiatry and deemed to have competency. There was no specific cause of her altered mental status. It was concluded that her physical and mental health were declining due to her chronic medical conditions. Currently home health has been coming out for dressing changes. Patient has also been doing her own dressing changes. She reports more skin breakdown to the periwound and now has a new wound. She denies fever/chills. She reports continued tenderness to the wound site. 4/12; patient with significant venous insufficiency and a large wound on her left lower leg taking up about 80% of the circumference of her lower leg. Cultures of this grew MRSA and Pseudomonas. She had completed a course of ciprofloxacin now is starting doxycycline. She has been using Dakin's wet-to-dry and a Tubigrip. She has home health twice a week and we change it once. 4/19; patient presents for follow-up. She completed her course of doxycycline. She has been using Dakin's wet-to-dry dressing and Tubigrip. Home health changes the dressing twice weekly. Currently she has no issues or complaints. 4/26; patient presents for follow-up. At last clinic visit orders for home health were Iodosorb under compression therapy. Unfortunately they did not have the dressing and have been using Dakin's and gentamicin under the wrap. Patient currently denies signs of infection. She  has no issues or complaints today. 5/3; patient presents for follow-up. Again Iodosorb has not been used under the compression therapy when home health comes out to change the wrap and dressing. They have been using Sorbact. It is unclear why this is happening since we send orders weekly to the agency. She denies signs of infection. Patient has not purchased the [facility] antibiotics. We reached out to the company and they said they have been trying to contact her on a regular basis. We gave the patient the number to  call to order the medication. 5/10; patient presents for follow-up. She has no issues or complaints today. Again home health has not been using Iodosorb. Mepilex was on the wound bed. No other dressings noted. She brought in her Keystone antibiotics. She denies signs of infection. 5/17; patient presents for follow-up. Home health has come out twice since she was last seen. Joint well she has been using Keystone antibiotic with Sorbact under the compression wrap. She has no issues or complaints today. She denies signs of infection. 5/24; patient presents for follow-up. We have been using Keystone antibiotics with Sorbact under compression therapy. She is tolerating the treatment well. She is reporting improvement in wound healing. She denies signs of infection. 5/31; patient presents for follow-up. We continue to [place] [facility] antibiotics with Sorbact under compression therapy. She continues to report improvement in wound healing. Home health comes out and changes the dressing once weekly. 05-17-2022 upon evaluation today patient appears to be doing better in regard to her wound especially compared to the last time I saw her. Fortunately I [place] think that she is seeing improvements. With that being said I [place] believe that she may be benefit from sharp debridement today to clear away some of the necrotic debris I discussed that with her as well. She is an amendable to that plan. Otherwise she is very pleased with how the [employer] is doing for her. 6/14; patient presents for follow-up. We have been using Keystone antibiotic with Sorbact and absorbent dressings under 3 layer compression. She has no issues or complaints today. She reports improvement in wound healing. She denies signs of infection. 6/21; patient presents for follow-up. We are continuing with [facility] antibiotic and Sorbact under 3 layer compression. Patient has no complaints. Continued wound healing is happening. She denies signs of  infection. 6/28; patient presents for follow-up. We have been using Keystone antibiotic with Sorbact under 3 layer compression. Usually home health comes out and changes the dressing twice a week. Unfortunately they did not go out to change the dressing. It is unclear why. Patient did not call them. She currently denies signs of infection. 7/5; patient presents for follow-up. We have been using Keystone antibiotic with calcium alginate under 3 layer compression. She reports improvement in wound healing. She denies signs of infection. Home health has come out to [place] dressing changes twice this past week. 7/12; patient presents for follow-up. We have been using Keystone antibiotic with calcium alginate under 3 layer compression. Patient states that home health came out once last week to change the dressing. She reports improvement in wound healing. She currently denies signs of infection. 7/19; patient presents for follow-up. We have been using Keystone antibiotic with calcium alginate under 3 layer compression. Home health came out once last week to change the dressing. She has no issues or complaints today. She denies signs of infection. 8/2; patient presents for follow-up. We have been using Keystone antibiotic with calcium alginate under 3 layer  compression. Unfortunately she missed her appointment last week and home health did not come out to [place] dressing changes. Patient currently denies signs of infection. 8/9; patient presents for follow-up. We have been using Keystone with calcium alginate under 3 layer compression. She states that home health came out once last week. She currently denies signs of infection. Her wrap was completely wet. She states she was cleaning the top of the leg and water soaked down into Laura Santiago, Laura Santiago ([id number]) [id number] Page 9 of 13 the wrap. 8/16; patient presents for follow-up. We have been using Keystone with calcium alginate under 3  layer compression. She states that home health came out twice last week. She has no issues or complaints today. 8/23; patient presents for follow-up. He has been using Keystone with calcium alginate under 3 layer compression. Home health came out twice last week. She denies signs of infection. 8/30; patient presents for follow-up. We have been using Keystone with calcium alginate under 3 layer compression. Home health came out once last week to change the dressing. Patient reports improvement in wound healing. She states she is almost done with her chemotherapy infusions and has 1 more left. 9/13; patient presents for follow-up. She has lost the capsules to her [facility] antibiotic which I believe is the vancomycin pills. She has her Zosyn powder today. We have been using Keystone antibiotic ointment with calcium alginate under 3 layer compression. She is concerned about systemic infection however her vitals are stable and there is no surrounding soft tissue infection. She would like to remain a patient in our wound care center however would like a second opinion for her wound care at another facility. She asked to be referred to [facility] wound care center. 9/20; patient presents for follow-up. She found her vancomycin capsules and brought in her complete Keystone antibiotic ointment set today. Unfortunately she has developed skin breakdown and Erythema to the right lower extremity With increased swelling. She states she went to a pow wow Over the weekend and was on her feet for extended periods of time. She saw her oncologist yesterday who prescribed her doxycycline for her right lower extremity erythema. 9/27; patient presents for follow-up. We have been using Keystone antibiotic with Aquacel under 3 layer compression to the lower extremities bilaterally. When home health came and changed the wrap she secretly put coffee into the spray mix along with [facility] antibiotic on her leg thinking the acidic  component would better activate the zoysn (sonething she discussed with her microbiologist brother). She has reported improvement in wound healing. 10/4; patient presents for follow-up. She has no issues or complaints today. We have been doing Aquacel and keystone under 3 layer compression to the lower extremities bilaterally. This morning she took the right lower extremity wrap off as it was uncomfortable. She has no open wounds to this leg. 10/11; patient presents for follow-up. We have been doing Aquacel with Keystone antibiotic ointment under 3 layer compression to the left lower extremity. She developed a small blister to the anterior aspect of the left leg noticed when the wrap was taken off on intake. She currently denies signs of infection. 10/18; patient presents for follow-up. We have been doing Aquacel with Keystone antibiotic ointment under 3 layer compression to the left lower extremity. There has been continued improvement in wound healing. She denies signs of infection. 10/25; patient arrives for treatment of venous insufficiency ulcers on her left lower leg both lateral and medial are remanence of apparently a circumferential wound.  Much improved. We are using topicals Keystone and Aquacel Ag under 3 layer compression we continue to make good progress. The patient talk to me at some length with regards to different things she has on her forehead and her Peri orbital area for which she is apparently applying [place]. She feels that what ever we are treating on her wounds is a more systemic problem. I really was not able to get a handle on what she is talking about however I did caution her not to put the [place] in her eyes. 11/1; her wounds continue to improve she is using Keystone and Aquacel Ag G under 3 layer compression. Our intake nurse notes erythema and edema in the right leg. The patient has a litany of concerns with regards to a rash on her forehead or ears and other systemic  complaints. She has an appointment with dermatology on November 11 11/8; patient presents for follow-up. We have been using Keystone and Aquacel under 4-layer compression. She has no issues or complaints today. She reports improvement in wound healing. 11/15; patient presents for follow-up. We have been using Aquacel with Keystone antibiotic under 3 layer compression. Patient continues improvement in wound healing. 12/6; patient presents for follow-up. We have been using Aquacel with Keystone antibiotic ointment under 3 layer compression. Wounds appear well-healing. 12/13; patient presents for follow-up. We have been using Aquacel with Keystone antibiotic under 3 layer compression. She has no issues or complaints today. Objective Constitutional Vitals Time Taken: 11:12 AM, Height: 66 in, Weight: 153 lbs, BMI: 24.7, Temperature: 98.0 F, Pulse: 74 bpm, Respiratory Rate: 18 breaths/min, Blood Pressure: 138/81 mmHg. General Notes: Left lower extremity: T the distal medial lateral aspect there are open wounds with granulation tissue throughout. No surrounding signs of o infection. Venous stasis dermatitis. Adequate edema control. Integumentary (Hair, Skin) Wound #1 status is Open. Original cause of wound was Gradually Appeared. The date acquired was: 04/06/2021. The wound has been in treatment 72 weeks. The wound is located on the Left,Medial Lower Leg. The wound measures 4.3cm length x 1.4cm width x 0.1cm depth; 4.728cm^2 area and 0.473cm^3 volume. There is Fat Layer (Subcutaneous Tissue) exposed. There is no tunneling or undermining noted. There is a medium amount of serosanguineous drainage noted. The wound margin is flat and intact. There is medium (34-66%) red, pink granulation within the wound bed. There is a medium (34-66%) amount of necrotic tissue within the wound bed including Adherent Slough. Wound #8 status is Open. Original cause of wound was Pressure Injury. The date acquired was:  10/18/2022. The wound has been in treatment 5 weeks. The wound is located on the Left,Lateral Lower Leg. The wound measures 1cm length x 1.5cm width x 0.1cm depth; 1.178cm^2 area and 0.118cm^3 volume. There is Fat Layer (Subcutaneous Tissue) exposed. There is no tunneling or undermining noted. There is a medium amount of serosanguineous drainage noted. There is no granulation within the wound bed. There is a small (1-33%) amount of necrotic tissue within the wound bed including Adherent Slough. Assessment Laura [place], Laura Santiago ([id number]) [id number] Page 10 of 13 Active Problems ICD-10 Non-pressure chronic ulcer of other part of left lower leg with fat layer exposed Chronic venous hypertension (idiopathic) with ulcer of left lower extremity Chronic venous hypertension (idiopathic) with ulcer of right lower extremity Venous insufficiency (chronic) (peripheral) Long term (current) use of anticoagulants Essential (primary) hypertension Secondary malignant neoplasm of breast Patient's wounds appear well-healing. I recommended continuing the course with [facility] antibiotic ointment and silver alginate under  compression therapy. Follow-up in 1 week. Procedures Wound #1 Pre-procedure diagnosis of Wound #1 is a Venous Leg Ulcer located on the Left,Medial Lower Leg . There was a Four Layer Compression Therapy Procedure with a pre-treatment ABI of 1.5 by Laura [place]. [place] procedure Diagnosis Wound #1: Same as Pre-Procedure Plan Follow-up Appointments: Return Appointment in 1 week. Nurse Visit as needed Home Health: [place]: - [place] for wound care. May utilize formulary equivalent dressing for wound treatment orders unless otherwise specified. Home Health Nurse may visit PRN to address patientoos wound care needs. **Please direct any NON-WOUND related issues/requests for orders to patient's Primary Care Physician. **If current  dressing causes regression in wound condition, may D/C ordered dressing product/s and apply Normal Saline Moist Dressing daily until next [place] or Other MD appointment. **Notify Wound Healing Center of regression in wound condition at [phone]. Other Home Health Orders/Instructions: - Dressing change 3 x weekly, twice by home health and once at wound clinic weekly. Please make sure frequency between dressing changes is appropriate Bathing/ Shower/ Hygiene: May shower with wound dressing protected with water repellent cover or cast protector. No tub bath. Anesthetic (Use 'Patient Medications' Section for Anesthetic Order Entry): Lidocaine applied to wound bed Edema Control - Lymphedema / Segmental Compressive Device / Other: Optional: One layer of unna paste to top of compression wrap (to act as an anchor). - Please when applying wrap start from toes and go up just below the knee 4 Layer Compression System Lymphedema. - left lower leg Tubigrip single layer applied. - Tubi D right lower leg Elevate, Exercise Daily and Avoid Standing for Long Periods of Time. Elevate legs to the level of the heart and pump ankles as often as possible Elevate leg(s) parallel to the floor when sitting. [place] YOUR BEST to sleep in the bed at night. [place] NOT sleep in your recliner. Long hours of sitting in a recliner leads to swelling of the legs and/or potential wounds on your backside. Additional Orders / Instructions: Follow Nutritious Diet and Increase Protein Intake Medications-Please add to medication list.: Keystone Compound WOUND #1: - Lower Leg Wound Laterality: Left, Medial Cleanser: Wound Cleanser 1 x Per Day/30 Days Discharge Instructions: Wash your hands with soap and water. Remove old dressing, discard into plastic bag and place into trash. Cleanse the wound with Wound Cleanser prior to applying a clean dressing using gauze sponges, not tissues or cotton balls. Santiago not scrub or use  excessive force. Pat dry using gauze sponges, not tissue or cotton balls. Topical: Triamcinolone Acetonide Cream, 0.1%, 15 (g) tube 1 x Per Day/30 Days Discharge Instructions: Mix 1:1 with Nystatin cream and apply to periwound and leg Topical: Nystatin Cream, 15 (g) tube 1 x Per Day/30 Days Prim Dressing: Aquacel Extra Hydrofiber Dressing, 2x2 (in/in) 1 x Per Day/30 Days ary Secondary Dressing: Zetuvit Plus 4x8 (in/in) 1 x Per Day/30 Days Com pression Wrap: Medichoice 4 layer Compression System, 35-40 mmHG 1 x Per Day/30 Days Discharge Instructions: Apply multi-layer wrap as directed. WOUND #8: - Lower Leg Wound Laterality: Left, Lateral Cleanser: Wound Cleanser 1 x Per Day/30 Days Discharge Instructions: Wash your hands with soap and water. Remove old dressing, discard into plastic bag and place into trash. Cleanse the wound with Wound Cleanser prior to applying a clean dressing using gauze sponges, not tissues or cotton balls. Santiago not scrub or use excessive force. Pat dry using gauze sponges, not tissue or cotton balls. Topical: Triamcinolone Acetonide Cream,  0.1%, 15 (g) tube 1 x Per Day/30 Days Discharge Instructions: Mix 1:1 with Nystatin cream and apply to periwound and leg Topical: Nystatin Cream, 15 (g) tube 1 x Per Day/30 Days Prim Dressing: Aquacel Extra Hydrofiber Dressing, 2x2 (in/in) 1 x Per Day/30 Days ary Secondary Dressing: Zetuvit Plus 4x8 (in/in) 1 x Per Day/30 Days Laura Santiago, Laura Santiago ([id number]) [id number] Page 11 of 13 1. Keystone antibiotic ointment with Aquacel under 3 layer compressionooleft lower extremity 2. Follow-up in 1 week Electronic Signature(s) Signed: 11/22/2022 11:46:52 AM By: Laura Santiago Entered By: Laura Santiago on 11/22/2022 11:45:33 -------------------------------------------------------------------------------- ROS/PFSH Details Patient Name: Date of Service: Laura Santiago, Laura Santiago. 11/22/2022 10:30 A Santiago Medical  Record Number: [id number] Patient Account Number: [id number] Date of Birth/Sex: Treating RN: 10-23-1946 (76 y.o. Laura Santiago Primary Care Provider: [place] Other Clinician: Massie Santiago Referring Provider: Treating Provider/Extender: Laura Santiago [facility] Weeks in Treatment: 70 Information Obtained From Patient Eyes Medical History: Negative for: Cataracts; Glaucoma; Optic Neuritis Ear/Nose/Mouth/Throat Medical History: Negative for: Chronic sinus problems/congestion; Middle ear problems Hematologic/Lymphatic Medical History: Negative for: Anemia; Hemophilia; Human Immunodeficiency Virus; Lymphedema; Sickle Cell Disease Respiratory Medical History: Negative for: Aspiration; Asthma; Chronic Obstructive Pulmonary Disease (COPD); Pneumothorax; Sleep Apnea; Tuberculosis Cardiovascular Medical History: Positive for: Hypertension Negative for: Angina; Arrhythmia; Congestive Heart Failure; Coronary Artery Disease; Deep Vein Thrombosis; Hypotension; Myocardial Infarction; Peripheral Arterial Disease; Peripheral Venous Disease; Phlebitis; Vasculitis Gastrointestinal Medical History: Negative for: Cirrhosis ; Colitis; Crohns; Hepatitis A; Hepatitis B; Hepatitis C Endocrine Medical History: Negative for: Type I Diabetes; Type II Diabetes Genitourinary Medical History: Negative for: End Stage Renal Disease Immunological AMAURIE, SCHRECKENGOST ([id number]) [id number] Page 12 of 13 Medical History: Negative for: Lupus Erythematosus; Raynauds; Scleroderma Integumentary (Skin) Medical History: Negative for: History of Burn; History of pressure wounds Musculoskeletal Medical History: Positive for: Osteoarthritis Negative for: Gout; Rheumatoid Arthritis; Osteomyelitis Oncologic Medical History: Positive for: Received Chemotherapy; Received Radiation Past Medical History Notes: breast cancer Immunizations Pneumococcal  Vaccine: Received Pneumococcal Vaccination: No Implantable Devices None Family and Social History Never smoker Electronic Signature(s) Signed: 11/22/2022 11:46:52 AM By: Laura Santiago Signed: 11/22/2022 3:37:15 PM By: Carlene Coria RN Entered By: Laura Santiago on 11/22/2022 11:46:23 -------------------------------------------------------------------------------- SuperBill Details Patient Name: Date of Service: Laura Santiago NNIE Santiago. 11/22/2022 Medical Record Number: [id number] Patient Account Number: [id number] Date of Birth/Sex: Treating RN: 11/22/1946 (76 y.o. Laura Santiago Primary Care Provider: [place] Other Clinician: Massie Santiago Referring Provider: Treating Provider/Extender: Laura Santiago [facility] Weeks in Treatment: 72 Diagnosis Coding ICD-10 Codes Code Description [phone] 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 I87.311 Chronic venous hypertension (idiopathic) with ulcer of right lower extremity I87.2 Venous insufficiency (chronic) (peripheral) Z79.01 Long term (current) use of anticoagulants I10 Essential (primary) hypertension 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 YOLANDER, GOODIE ([id number]) [id number] Page 13 of [place] Procedures : CPT4 Code: [id number] ( Description: Facility Use Only) [phone] - [facility] COMPRS LWR LT LEG ICD-10 Diagnosis Description 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sed Modifier: Quantity: 1 Physician Procedures : CPT4 Code Description Modifier [id number] - WC PHYS LEVEL 3 - EST PT ICD-10 Diagnosis Description 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.312 Chronic venous hypertension (idiopathic) with ulcer  of left lower  extremity  Z79.01 Long term (current) use of anticoagulants 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

## 2022-11-23 ENCOUNTER — Other Ambulatory Visit: Payer: Self-pay | Admitting: Oncology

## 2022-11-26 ENCOUNTER — Other Ambulatory Visit: Payer: Self-pay | Admitting: Oncology

## 2022-11-27 ENCOUNTER — Telehealth: Payer: Self-pay

## 2022-11-27 ENCOUNTER — Other Ambulatory Visit: Payer: Self-pay

## 2022-11-27 MED ORDER — OXYCODONE HCL 10 MG PO TABS: 15.0000 mg | ORAL_TABLET | Freq: Four times a day (QID) | ORAL | 0 refills | Status: DC | PRN

## 2022-11-27 NOTE — Telephone Encounter (Signed)
Leave voicemail oxy Rx request received & sent to pharmacy

## 2022-11-28 NOTE — Progress Notes (Signed)
SHAASIA, ODLE ([id number]) [id number] Page 1 of 10 Visit Report for Santiago/13/2023 Arrival Information Details Patient Name: Date of Service: Laura [name] Medical Record Number: [id number] Patient Account Number: [id number] Date of Birth/Sex: Treating RN: Laura [name] (76 y.o. Laura Santiago Primary Care Saharah Santiago: [place] Other Clinician: [name] Referring Tsering Leaman: Treating Manya Balash/Extender: [name] Weeks in Treatment: 29 Visit Information History Since Last Visit All ordered tests and consults were completed: No Patient Arrived: Laura Santiago Added or deleted any medications: No Arrival Time: 11:10 Any new allergies or adverse reactions: No Transfer Assistance: None Had a fall or experienced change in No Patient Identification Verified: Yes activities of daily living that may affect Secondary Verification Process Completed: Yes risk of falls: Patient Requires Transmission-Based No Signs or symptoms of abuse/neglect since last visito No Precautions: Hospitalized since last visit: No Patient Has Alerts: Yes Implantable device outside of the clinic excluding No Patient Alerts: PT HAS SERVICE cellular tissue based products placed in the center ANIMAL since last visit: ABI 07/11/21 Has Dressing in Place as Prescribed: Yes R) 1.16 L) 1.27 Has Compression in Place as Prescribed: Yes Pain Present Now: Yes Electronic Signature(s) Signed: 11/28/2022 4:44:06 PM By: [name] Entered By: [name] on Santiago/13/2023 11:10:49 -------------------------------------------------------------------------------- Clinic Level of Care Assessment Details Patient Name: Date of Service: Laura [name] Medical Record Number: [id number] Patient Account Number: [id number] Date of Birth/Sex: Treating RN: [birth year]aura Santiago (76 y.o. Laura Santiago Primary Care Sabrinna Yearwood: [place] Other Clinician: [name] Referring Kidus Delman: Treating Baelyn Doring/Extender: [name] Weeks in Treatment: 34 Clinic Level of Care Assessment Items TOOL 1 Quantity Score _0  - 0 Use when EandM and Procedure is performed on INITIAL visit ASSESSMENTS - Nursing Assessment / Reassessment _1  - 0 General Physical Exam (combine w/ comprehensive assessment (listed just below) when performed on new pt. [address]Laura Santiago ([id number]) [id number] Page 2 of 10 _2  - 0 Comprehensive Assessment (HX, ROS, Risk Assessments, Wounds Hx, etc.) ASSESSMENTS - Wound and Skin Assessment / Reassessment _3  - 0 Dermatologic / Skin Assessment (not related to wound area) ASSESSMENTS - Ostomy and/or Continence Assessment and Care _4  - 0 Incontinence Assessment and Management _5  - 0 Ostomy Care Assessment and Management (repouching, etc.) PROCESS - Coordination of Care _6  - 0 Simple Patient / Family Education for ongoing care _7  - 0 Complex (extensive) Patient / Family Education for ongoing care _8  - 0 Staff obtains Programmer, systems, Records, T Results / Process Orders est _9  - 0 Staff telephones HHA, Nursing Homes / Clarify orders / etc _10  - 0 Routine Transfer to another Facility (non-emergent condition) _11  - 0 Routine Hospital Admission (non-emergent condition) _12  - 0 New Admissions / Biomedical engineer / Ordering NPWT Apligraf, etc. , _13  - 0 Emergency Hospital Admission (emergent condition) PROCESS - Special Needs _14  - 0 Pediatric / Minor Patient Management _15  - 0 Isolation Patient Management _16  - 0 Hearing / Language / Visual special needs _17  - 0 Assessment of Community assistance (transportation, D/C planning, etc.) _18  - 0 Additional assistance / Altered mentation _19  - 0 Support Surface(s) Assessment (bed, cushion, seat, etc.) INTERVENTIONS - Miscellaneous _20  - 0 External ear exam _21  - 0 Patient Transfer  (multiple staff / Civil Service fast streamer / Similar devices) _22  - 0 Simple Staple / Suture removal (25 or less) _23  - 0 Complex Staple / Suture removal (26 or  more) _0  - 0 Hypo/Hyperglycemic Management (do not check if billed separately) _1  - 0 Ankle / Brachial Index (ABI) - do not check if billed separately Has the patient been seen at the hospital within the last three years: Yes Total Score: 0 Level Of Care: ____ Electronic Signature(s) Signed: 11/28/2022 4:44:06 PM By: [name] Entered By: [name] on Santiago/13/2023 11:38:04 -------------------------------------------------------------------------------- Compression Therapy Details Patient Name: Date of Service: Laura [name] Medical Record Number: [id number] Patient Account Number: [id number] Date of Birth/Sex: Treating RN: Laura [name] (76 y.o. Laura Santiago Primary Care Doris Gruhn: [place] Other Clinician: Racquel, Santiago ([id number]) [id number] Page 3 of 10 Referring Laura Santiago: Treating Laura Santiago/Extender: [name] Weeks in Treatment: 72 Compression Therapy Performed for Wound Assessment: Wound #1 Left,Medial Lower Leg Performed By: [name], [name], Compression Type: Four Layer Pre Treatment ABI: 1.5 Santiago Procedure Diagnosis Same as Pre-procedure Electronic Signature(s) Signed: 11/28/2022 4:44:06 PM By: [name] Entered By: [name] on Santiago/13/2023 11:37:26 -------------------------------------------------------------------------------- Encounter Discharge Information Details Patient Name: Date of Service: Laura [name] Medical Record Number: [id number] Patient Account Number: [id number] Date of Birth/Sex: Treating RN: Santiago/23/Laura Santiago (76 y.o. Laura Santiago Primary Care Donnavan Covault: [place] Other Clinician: [name] Referring Hermie Reagor: Treating  Khayden Herzberg/Extender: [name] Weeks in Treatment: 8 Encounter Discharge Information Items Discharge Condition: Stable Ambulatory Status: Walker Discharge Destination: Home Transportation: Other Accompanied By: self Schedule Follow-up Appointment: Yes Clinical Summary of Care: Electronic Signature(s) Signed: 11/28/2022 4:44:06 PM By: [name] Entered By: [name] on Santiago/13/2023 Santiago:04:31 -------------------------------------------------------------------------------- Lower Extremity Assessment Details Patient Name: Date of Service: Laura [name] Medical Record Number: [id number] Patient Account Number: [id number] Date of Birth/Sex: Treating RN: Laura Santiago[birth year] (76 y.o. Laura Santiago Primary Care Maat Kafer: [place] Other Clinician: [name] Referring Shaye Elling: Treating Johany Hansman/Extender: [name] Weeks in Treatment: [address], Andrew M ([id number]) [id number] Page 4 of 10 Electronic Signature(s) Signed: 11/22/2022 3:37:15 PM By: Carlene Coria RN Signed: 11/28/2022 4:44:06 PM By: [name] Entered By: [name] on Santiago/13/2023 11:28:50 -------------------------------------------------------------------------------- Multi Wound Chart Details Patient Name: Date of Service: Laura Santiago, [place]. [name] Medical Record Number: [id number] Patient Account Number: [id number] Date of Birth/Sex: Treating RN: [birth year] (76 y.o. Laura Santiago Primary Care Donie Moulton: [facility] Other Clinician: [name] Referring Netta Fodge: Treating Quantia Grullon/Extender: [name] Weeks in Treatment: 40 Vital Signs Height(in): 66 Pulse(bpm): 19 Weight(lbs): 153 Blood Pressure(mmHg): 138/81 Body Mass Index(BMI): 24.7 Temperature(F): 98.0 Respiratory Rate(breaths/min): 18 [1:Photos: No Photos Left,  Medial Lower Leg Wound Location: Gradually Appeared Wounding Event: Venous Leg Ulcer Primary Etiology: Hypertension, Osteoarthritis, ReceivedHypertension, Osteoarthritis, ReceivedN/A Comorbid History: Chemotherapy, Received Radiation  Chemotherapy, Received Radiation 04/06/2021 Date Acquired: 72 Weeks of Treatment: Open Wound Status: No Wound Recurrence: Yes Clustered Wound: 4.3x1.4x0.1 Measurements L x W x D (cm) 4.728 A (cm) : rea 0.473 Volume (cm) : 94.80% % Reduction in Area:  97.40% % Reduction in Volume: Full Thickness Without Exposed Classification: Support Structures Medium Exudate Amount: Serosanguineous Exudate Type: red, brown Exudate Color: Flat and Intact Wound Margin: Medium (34-66%) Granulation Amount: Red, Pink  Granulation Quality: Medium (34-66%) Necrotic Amount: Fat Layer (Subcutaneous Tissue): Yes Fat Layer (Subcutaneous Tissue): Yes N/A Exposed Structures: Fascia: No Tendon: No Muscle: No Joint: No Bone:  No Small (1-33%) Epithelialization:] [8:No Photos  Left, Lateral Lower Leg Pressure Injury Pressure Ulcer 10/18/2022 5 Open No No 1x1.5x0.1 1.178 0.118 71.60% 85.80% Category/Stage II Medium Serosanguineous red, brown N/A None Present (0%) N/A Small (1-33%) Fascia: No Tendon: No Muscle: No Joint: No Bone:  No None] [N/A:N/A N/A N/A N/A N/A N/A N/A N/A N/A N/A N/A N/A N/A N/A N/A N/A N/A N/A N/A N/A N/A N/A N/A] Treatment Notes Electronic Signature(s) Signed: 11/28/2022 4:44:06 PM By: [name] Entered By: [name] on Santiago/13/2023 11:28:54 Laura Santiago ([id number]) [id number] Page 5 of 10 -------------------------------------------------------------------------------- Multi-Disciplinary Care Plan Details Patient Name: Date of Service: Laura [name] Medical Record Number: [id number] Patient Account Number: [id number] Date of Birth/Sex: Treating RN: Laura Santiago[birth year] (76 y.o. Laura Santiago Primary Care Sherria Riemann: [place] Other Clinician: [name] Referring Kyra Laffey: Treating Kailie Polus/Extender: [name] Weeks in Treatment: 70 Active Inactive Necrotic Tissue Nursing Diagnoses: Impaired tissue integrity related to necrotic/devitalized tissue Knowledge deficit related to management of necrotic/devitalized tissue Goals: Necrotic/devitalized tissue will be minimized in the wound bed Date Initiated: 07/06/2021 Target Resolution Date: 12/13/2022 Goal Status: Active Patient/caregiver will verbalize understanding of reason and process for debridement of necrotic tissue Date Initiated: 07/06/2021 Date Inactivated: 10/05/2021 Target Resolution Date: 07/06/2021 Goal Status: Met Interventions: Assess patient pain level pre-, during and Santiago procedure and prior to discharge Provide education on necrotic tissue and debridement process Treatment Activities: Apply topical anesthetic as ordered : 07/06/2021 Biologic debridement : 07/06/2021 Enzymatic debridement : 07/06/2021 Excisional debridement : 07/06/2021 Notes: Soft Tissue Infection Nursing Diagnoses: Impaired tissue integrity Potential for infection: soft tissue Goals: Patient's soft tissue infection will resolve Date Initiated: 03/15/2022 Target Resolution Date: 12/13/2022 Goal Status: Active Signs and symptoms of infection will be recognized early to allow for prompt treatment Date Initiated: 03/15/2022 Target Resolution Date: 12/13/2022 Goal Status: Active Interventions: Assess signs and symptoms of infection every visit Treatment Activities: Culture and sensitivity : 03/15/2022 Notes: Electronic Signature(s) Signed: 11/22/2022 3:37:15 PM By: Carlene Coria RN Laura Santiago ([id number]) [id number] Page 6 of 10 Signed: 11/28/2022 4:44:06 PM By: [name] Entered By: [name] on Santiago/13/2023  11:38:46 -------------------------------------------------------------------------------- Pain Assessment Details Patient Name: Date of Service: Laura [name] Medical Record Number: [id number] Patient Account Number: [id number] Date of Birth/Sex: Treating RN: [birth year] (76 y.o. Laura Santiago Primary Care Raeli Wiens: [place] Other Clinician: [name] Referring Shevon Sian: Treating Editha Bridgeforth/Extender: [name] Weeks in Treatment: 63 Active Problems Location of Pain Severity and Description of Pain Patient Has Paino Yes Site Locations Pain Location: Generalized Pain Duration of the Pain. Constant / Intermittento Constant Rate the pain. Current Pain Level: 5 Character of Pain Describe the Pain: Aching Pain Management and Medication Current Pain Management: Medication: Yes Cold Application: No Rest: Yes Massage: No Activity: No T.E.N.S.: No Heat Application: No Leg drop or elevation: No Is the Current Pain Management Adequate: Inadequate How does your wound impact your activities of daily livingo Sleep: No Bathing: No Appetite: No Relationship With Others: No Bladder Continence: No Emotions: No Bowel Continence: No Work: No Toileting: No Drive: No Dressing: No Hobbies: No Electronic Signature(s) Signed: 11/22/2022 3:37:15 PM By: Carlene Coria RN Signed: 11/28/2022 4:44:06 PM By: [name] Entered By: [name] on Santiago/13/2023 11:15:44 Laura Santiago ([id number]) [id number] Page 7 of 10 -------------------------------------------------------------------------------- Patient/Caregiver Education Details Patient Name: Date of Service: Laura  Allie Bossier [name] Santiago/13/2023andnbsp10:30 A M Medical Record Number: [id number] Patient Account Number: [id number] Date of Birth/Gender: Treating RN: [birth year] (76 y.o. Laura Santiago Primary Care Physician: [place] Other Clinician: [name] Referring Physician: Treating Physician/Extender: [place], [place] Weeks in Treatment: 28 Education Assessment Education Provided To: Patient Education Topics Provided Wound/Skin Impairment: Handouts: Other: continue wound care as directed Methods: Explain/Verbal Responses: State content correctly Electronic Signature(s) Signed: 11/28/2022 4:44:06 PM By: [name] Entered By: [name] on Santiago/13/2023 11:38:40 -------------------------------------------------------------------------------- Wound Assessment Details Patient Name: Date of Service: Laura [name] Medical Record Number: [id number] Patient Account Number: [id number] Date of Birth/Sex: Treating RN: [birth year]Laura Santiago (76 y.o. Laura Santiago Primary Care Shacola Schussler: [place] Other Clinician: [name] Referring Erin Obando: Treating Brendolyn Stockley/Extender: [name] Weeks in Treatment: 72 Wound Status Wound Number: 1 Primary Venous Leg Ulcer Etiology: Wound Location: Left, Medial Lower Leg Wound Status: Open Wounding Event: Gradually Appeared Comorbid Hypertension, Osteoarthritis, Received Chemotherapy, Date Acquired: 04/06/2021 History: Received Radiation Weeks Of Treatment: 72 Clustered Wound: Yes Photos Laura Santiago, Laura Santiago ([id number]) [id number] Page 8 of 10 Wound Measurements Length: (cm) 4.3 Width: (cm) 1.4 Depth: (cm) 0.1 Area: (cm) 4.728 Volume: (cm) 0.473 % Reduction in Area: 94.8% % Reduction in Volume: 97.4% Epithelialization: Small (1-33%) Tunneling: No Undermining: No Wound Description Classification: Full Thickness Without Exposed Support Structures Wound Margin: Flat and Intact Exudate Amount: Medium Exudate Type: Serosanguineous Exudate Color: red, brown Foul Odor After Cleansing: No Slough/Fibrino Yes Wound Bed Granulation  Amount: Medium (34-66%) Exposed Structure Granulation Quality: Red, Pink Fascia Exposed: No Necrotic Amount: Medium (34-66%) Fat Layer (Subcutaneous Tissue) Exposed: Yes Necrotic Quality: Adherent Slough Tendon Exposed: No Muscle Exposed: No Joint Exposed: No Bone Exposed: No Treatment Notes Wound #1 (Lower Leg) Wound Laterality: Left, Medial Cleanser Wound Cleanser Discharge Instruction: Wash your hands with soap and water. Remove old dressing, discard into plastic bag and place into trash. Cleanse the wound with Wound Cleanser prior to applying a clean dressing using gauze sponges, not tissues or cotton balls. Do not scrub or use excessive force. Pat dry using gauze sponges, not tissue or cotton balls. Peri-Wound Care Topical Triamcinolone Acetonide Cream, 0.1%, 15 (g) tube Discharge Instruction: Mix 1:1 with Nystatin cream and apply to periwound and leg Nystatin Cream, 15 (g) tube Primary Dressing Aquacel Extra Hydrofiber Dressing, 2x2 (in/in) Secondary Dressing Zetuvit Plus 4x8 (in/in) Secured With Compression Wrap Medichoice 4 layer Compression System, 35-40 mmHG Discharge Instruction: Apply multi-layer wrap as directed. Compression Stockings Add-Ons Electronic Signature(s) Signed: 11/22/2022 3:37:15 PM By: Carlene Coria RN Signed: 11/28/2022 4:44:06 PM By: [name] Entered By: [name] on Santiago/13/2023 11:32:09 Laura Santiago ([id number]) [id number] Page 9 of 10 -------------------------------------------------------------------------------- Wound Assessment Details Patient Name: Date of Service: Laura [name] Medical Record Number: [id number] Patient Account Number: [id number] Date of Birth/Sex: Treating RN: Laura Santiago, Laura Santiago (76 y.o. Laura Santiago Primary Care Steph Cheadle: [place] Other Clinician: [name] Referring Conception Doebler: Treating Skie Vitrano/Extender: [name] Weeks in Treatment: 72 Wound Status Wound Number: 8 Primary Pressure Ulcer Etiology: Wound Location: Left, Lateral Lower Leg Wound Status: Open Wounding Event: Pressure Injury Comorbid Hypertension, Osteoarthritis, Received Chemotherapy, Date Acquired: 10/18/2022 History: Received Radiation Weeks Of Treatment: 5 Clustered Wound: No Photos Wound Measurements Length: (cm) 1 Width: (cm) 1.5 Depth: (cm) 0.1 Area: (cm) 1.178 Volume: (cm) 0.118 %  Reduction in Area: 71.6% % Reduction in Volume: 85.8% Epithelialization: None Tunneling: No Undermining: No Wound Description Classification: Category/Stage II Exudate Amount: Medium Exudate Type: Serosanguineous Exudate Color: red, brown Foul Odor After Cleansing: No Slough/Fibrino Yes Wound Bed Granulation Amount: None Present (0%) Exposed Structure Necrotic Amount: Small (1-33%) Fascia Exposed: No Necrotic Quality: Adherent Slough Fat Layer (Subcutaneous Tissue) Exposed: Yes Tendon Exposed: No Muscle Exposed: No Joint Exposed: No Bone Exposed: No Treatment Notes Wound #8 (Lower Leg) Wound Laterality: Left, Lateral Cleanser Wound Cleanser Discharge Instruction: Wash your hands with soap and water. Remove old dressing, discard into plastic bag and place into trash. Cleanse the wound with Wound Cleanser prior to applying a clean dressing using gauze sponges, not tissues or cotton balls. Do not scrub or use excessive force. Pat dry using gauze sponges, not tissue or cotton balls. KHARI, LETT ([id number]) [id number] Page 10 of 10 Peri-Wound Care Topical Triamcinolone Acetonide Cream, 0.1%, 15 (g) tube Discharge Instruction: Mix 1:1 with Nystatin cream and apply to periwound and leg Nystatin Cream, 15 (g) tube Primary Dressing Aquacel Extra Hydrofiber Dressing, 2x2 (in/in) Secondary Dressing Zetuvit Plus 4x8 (in/in) Secured With Compression Wrap Compression  Stockings Add-Ons Electronic Signature(s) Signed: 11/22/2022 3:37:15 PM By: Carlene Coria RN Signed: 11/28/2022 4:44:06 PM By: [name] Entered By: [name] on Santiago/13/2023 11:32:41 -------------------------------------------------------------------------------- Vitals Details Patient Name: Date of Service: Laura Santiago, Laura Rubenstein [name] [name] Medical Record Number: [id number] Patient Account Number: [id number] Date of Birth/Sex: Treating RN: [birth year], Laura Santiago (76 y.o. Laura Santiago Primary Care Keyanni Whittinghill: [place] Other Clinician: [name] Referring Lalo Tromp: Treating Laketta Soderberg/Extender: [name] Weeks in Treatment: 109 Vital Signs Time Taken: 11:Santiago Temperature (F): 98.0 Height (in): 66 Pulse (bpm): 74 Weight (lbs): 153 Respiratory Rate (breaths/min): 18 Body Mass Index (BMI): 24.7 Blood Pressure (mmHg): 138/81 Reference Range: 80 - 120 mg / dl Electronic Signature(s) Signed: 11/28/2022 4:44:06 PM By: [name] Entered By: [name] on Santiago/13/2023 11:15:40

## 2022-11-29 ENCOUNTER — Encounter: Payer: Medicare Other | Admitting: Internal Medicine

## 2022-11-30 ENCOUNTER — Telehealth: Payer: Self-pay | Admitting: *Deleted

## 2022-11-30 ENCOUNTER — Ambulatory Visit
Admission: RE | Admit: 2022-11-30 | Discharge: 2022-11-30 | Disposition: A | Discharge status: Home / Self Care | Payer: Medicare Other | Source: Ambulatory Visit | Attending: Oncology | Admitting: Oncology

## 2022-11-30 ENCOUNTER — Other Ambulatory Visit: Payer: Self-pay | Admitting: *Deleted

## 2022-11-30 DIAGNOSIS — R9430 Abnormal result of cardiovascular function study, unspecified: Secondary | ICD-10-CM

## 2022-11-30 DIAGNOSIS — Z5181 Encounter for therapeutic drug level monitoring: Secondary | ICD-10-CM | POA: Diagnosis not present

## 2022-11-30 DIAGNOSIS — C50919 Malignant neoplasm of unspecified site of unspecified female breast: Secondary | ICD-10-CM | POA: Insufficient documentation

## 2022-11-30 DIAGNOSIS — Z79899 Other long term (current) drug therapy: Secondary | ICD-10-CM | POA: Diagnosis not present

## 2022-11-30 DIAGNOSIS — I080 Rheumatic disorders of both mitral and aortic valves: Secondary | ICD-10-CM | POA: Diagnosis not present

## 2022-11-30 DIAGNOSIS — Z0189 Encounter for other specified special examinations: Secondary | ICD-10-CM | POA: Diagnosis not present

## 2022-11-30 DIAGNOSIS — I10 Essential (primary) hypertension: Secondary | ICD-10-CM | POA: Diagnosis not present

## 2022-11-30 LAB — ECHOCARDIOGRAM COMPLETE
AR max vel: 2.44 cm2
AV Area VTI: 2.31 cm2
AV Area mean vel: 2.56 cm2
AV Mean grad: 6 mmHg
AV Peak grad: 10.4 mmHg
Ao pk vel: 1.61 m/s
Area-P 1/2: 3.85 cm2
Calc EF: 45.3 %
S' Lateral: 3.4 cm
Single Plane A2C EF: 43.5 %
Single Plane A4C EF: 45.5 %

## 2022-11-30 NOTE — Progress Notes (Signed)
*  PRELIMINARY RESULTS* Echocardiogram 2D Echocardiogram has been performed.  Laura Santiago 11/30/2022, 11:49 AM

## 2022-11-30 NOTE — Telephone Encounter (Signed)
I called to get a referral for cardiac and the [facility] in [place] is booked out to March . They do have an appt in [place] for jan 9 at 1:40. It is the [facility] at [place].  Address: [address]. Arrive 15 min earlier to register.  The phone number is [phone] and that number is for all the locations that they have. [facility] wants me to send my chart so I am sending it to my chart also

## 2022-12-05 ENCOUNTER — Encounter: Payer: Self-pay | Admitting: Oncology

## 2022-12-05 ENCOUNTER — Inpatient Hospital Stay (HOSPITAL_BASED_OUTPATIENT_CLINIC_OR_DEPARTMENT_OTHER): Payer: Medicare Other | Admitting: Oncology

## 2022-12-05 VITALS — BP 151/80 | HR 86 | Temp 98.7°F | Resp 20 | Wt 143.2 lb

## 2022-12-05 DIAGNOSIS — C50919 Malignant neoplasm of unspecified site of unspecified female breast: Secondary | ICD-10-CM

## 2022-12-05 DIAGNOSIS — M549 Dorsalgia, unspecified: Secondary | ICD-10-CM

## 2022-12-05 DIAGNOSIS — C7951 Secondary malignant neoplasm of bone: Secondary | ICD-10-CM | POA: Diagnosis not present

## 2022-12-05 DIAGNOSIS — C50911 Malignant neoplasm of unspecified site of right female breast: Secondary | ICD-10-CM | POA: Diagnosis not present

## 2022-12-05 DIAGNOSIS — G893 Neoplasm related pain (acute) (chronic): Secondary | ICD-10-CM | POA: Diagnosis not present

## 2022-12-05 NOTE — Progress Notes (Signed)
Patient states she has been having serve pain in the middle of her back and right side kidney. Patient states the pain ease when she takes oxy.

## 2022-12-06 ENCOUNTER — Encounter: Payer: Medicare Other | Admitting: Internal Medicine

## 2022-12-06 DIAGNOSIS — I87313 Chronic venous hypertension (idiopathic) with ulcer of bilateral lower extremity: Secondary | ICD-10-CM | POA: Diagnosis not present

## 2022-12-06 NOTE — Progress Notes (Signed)
LAZARA, GRIESER ([id number]) [id number] Page 1 of 11 Visit Report for 12/06/2022 HPI Details Patient Name: Date of Service: Laura Santiago. 12/06/2022 10:30 A M Medical Record Number: [id number] Patient Account Number: [id number] Date of Birth/Sex: Treating RN: March 08, 1946 (76 y.o. [name] Primary Care Provider: [place] Other Clinician: [name] Referring Provider: Treating Provider/Extender: RO BSO N, [place] in Treatment: 74 History of Present Illness HPI Description: Admission 7/27 Ms. Layali Freund is a 76 year old female with a past medical history of ADHD, metastatic breast cancer, stage IV chronic kidney disease, history of DVT on Xarelto and chronic venous insufficiency that presents to the clinic for a chronic left lower extremity wound. She recently moved to [facility] 4 days ago. She was being followed by wound care center in [name]. She reports a 10-year history of wounds to her left lower extremity that eventually do heal with debridement and compression therapy. She states that the current wound reopened 4 months ago and she is using Vaseline and Coban. She denies signs of infection. 8/3; patient presents for 1 week follow-up. She reports no issues or complaints today. She states she had vascular studies done in the last week. She denies signs of infection. She brought her little service dog with her today. 8/17; patient presents for follow-up. She has missed her last clinic appointment. She states she took the wrap off and attempted to rewrap her leg. She is having difficulty with transportation. She has her service dog with her today. Overall she feels well and reports improvement in wound healing. She denies signs of infection. She reports owning an old Velcro wrap compression and has this at her living facility 9/14; patient presents for follow-up. Patient states that  over the past 2 to 3 weeks she developed toe wounds to her right foot. She attributes this to tight fitting shoes. She subsequently developed cellulitis in the right leg and has been treated by doxycycline by her oncologist. She reports improvement in symptoms however continues to have some redness and swelling to this leg. T the left lower extremity patient has been having her wraps changed with home health twice weekly. She states that the [facility] is not helping control o the drainage. Other than that she has no issues or complaints today. She denies signs of infection to the left lower extremity. 9/21; patient presents for follow-up. She reports seeing infectious disease for her cellulitis. She reports no further management. She has home health that changes the wraps twice weekly. She has no issues or complaints today. She denies signs of infection. 10/5; patient presents for follow-up. She has no issues or complaints today. She denies signs of infection. She states that the right great toe has not been dressed by home health. 10/12; patient presents for follow-up. She has no issues or complaints today. She reports improvement in her wound healing. She has been using silver alginate to the right great toe wound. She denies signs of infection. 10/26; patient presents for follow-up. Home health did not have sorbact so they continued to use Hydrofera Blue under the wrap. She has been using silver alginate to the great toe wound however she did not have a dressing in place today. She currently denies signs of infection. 11/2; patient presents for follow-up. She has been using sorb act under the compression wrap. She reports using silver alginate to the toe wound again she does not have a dressing in  place. She currently denies signs of infection. 11/23; patient presents for follow-up. Unfortunately she has missed her last 2 clinic appointments. She was last seen 3 weeks ago. She did her  own compression wrap with Kerlix and Coban yesterday after seeing vein and vascular. She has not been dressing her right great toe wound. She currently denies signs of infection. 11/30; patient presents for 1 week follow-up. She states she changed her dressing last week prior to home health and use sorb act with Dakin's and Hydrofera Blue. Home health has changed the dressing as well and they have been using sorbact. T oday she reports increased redness to her right lower extremity. She has a history of cellulitis to this leg. She has been using silver alginate to the right great toe. Unfortunately she had an episode of diarrhea prior to coming in and had feces all over the right leg and to the wrap of her left leg. 12/7; patient presents for 1 week follow-up. She states that home health did not come out to change the dressing and she took it off yesterday. It is unclear if she is dressing the right toe wound. She denies signs of infection. 12/14; patient presents for 1 week follow-up. She has no issues or complaints today. 12/21; patient presents for follow-up. She has no issues or complaints today. She denies signs of infection. 12/28/2021; patient presents for follow-up. She was hospitalized for sepsis secondary to right lower extremity cellulitis On 12/23. She states she is currently at a SNF. She states that she was started on doxycycline this morning for her right great toe swelling and redness. She is not sure what dressings have been done to her left lower extremity for the past 3 weeks. She says its been mainly gauze with an Santiago wrap. 1/25; patient presents for follow-up. She is still residing in a skilled nursing facility. She reports mild pain to the left lower extremity wound bed. She states she is going to see a podiatrist soon. AVANELLE, PIXLEY ([id number]) [id number] Page 2 of 11 2/8; patient presents for follow-up. She has moved back to her residential  community from her skilled nursing facility. She has no issues or complaints today. She denies signs of systemic infections. 2/15; patient presents for follow-up. He has no issues or complaints today. She denies systemic signs of infection. 2/22; patient presents for follow-up. She has no issues or complaints today. She denies signs of infection. 3/1; patient presents for follow-up. She states that home health came out the day after she was seen in our clinic and yesterday to do the wrap change. She denies signs of infection. She reports excoriated skin on the ankle. 3/8; patient presents for follow-up. She has no issues or complaints today. She denies signs of infection. 3/15; patient presents for follow-up. Home health has been coming out to change the dressings. She reports more tenderness to the wound site. She denies purulent drainage, increased warmth or erythema to the area. 4/5; patient presents for follow-up. She has missed her last 2 clinic appointments. I have not seen her in 3 weeks. She was recently hospitalized for altered mental status. She was involuntarily committed. She was evaluated by psychiatry and deemed to have competency. There was no specific cause of her altered mental status. It was concluded that her physical and mental health were declining due to her chronic medical conditions. Currently home health has been coming out for dressing changes. Patient has also been doing her own dressing changes. She reports  more skin breakdown to the periwound and now has a new wound. She denies fever/chills. She reports continued tenderness to the wound site. 4/12; patient with significant venous insufficiency and a large wound on her left lower leg taking up about 80% of the circumference of her lower leg. Cultures of this grew MRSA and Pseudomonas. She had completed a course of ciprofloxacin now is starting doxycycline. She has been using Dakin's wet-to-dry and a Tubigrip. She has home  health twice a week and we change it once. 4/19; patient presents for follow-up. She completed her course of doxycycline. She has been using Dakin's wet-to-dry dressing and Tubigrip. Home health changes the dressing twice weekly. Currently she has no issues or complaints. 4/26; patient presents for follow-up. At last clinic visit orders for home health were Iodosorb under compression therapy. Unfortunately they did not have the dressing and have been using Dakin's and gentamicin under the wrap. Patient currently denies signs of infection. She has no issues or complaints today. 5/3; patient presents for follow-up. Again Iodosorb has not been used under the compression therapy when home health comes out to change the wrap and dressing. They have been using Sorbact. It is unclear why this is happening since we send orders weekly to the agency. She denies signs of infection. Patient has not purchased the [facility] antibiotics. We reached out to the company and they said they have been trying to contact her on a regular basis. We gave the patient the number to call to order the medication. 5/10; patient presents for follow-up. She has no issues or complaints today. Again home health has not been using Iodosorb. Mepilex was on the wound bed. No other dressings noted. She brought in her Keystone antibiotics. She denies signs of infection. 5/17; patient presents for follow-up. Home health has come out twice since she was last seen. Joint well she has been using Keystone antibiotic with Sorbact under the compression wrap. She has no issues or complaints today. She denies signs of infection. 5/24; patient presents for follow-up. We have been using Keystone antibiotics with Sorbact under compression therapy. She is tolerating the treatment well. She is reporting improvement in wound healing. She denies signs of infection. 5/31; patient presents for follow-up. We continue to do [facility] antibiotics with Sorbact  under compression therapy. She continues to report improvement in wound healing. Home health comes out and changes the dressing once weekly. 05-17-2022 upon evaluation today patient appears to be doing better in regard to her wound especially compared to the last time I saw her. Fortunately I do think that she is seeing improvements. With that being said I do believe that she may be benefit from sharp debridement today to clear away some of the necrotic debris I discussed that with her as well. She is an amendable to that plan. Otherwise she is very pleased with how the [employer] is doing for her. 6/14; patient presents for follow-up. We have been using Keystone antibiotic with Sorbact and absorbent dressings under 3 layer compression. She has no issues or complaints today. She reports improvement in wound healing. She denies signs of infection. 6/21; patient presents for follow-up. We are continuing with [facility] antibiotic and Sorbact under 3 layer compression. Patient has no complaints. Continued wound healing is happening. She denies signs of infection. 6/28; patient presents for follow-up. We have been using Keystone antibiotic with Sorbact under 3 layer compression. Usually home health comes out and changes the dressing twice a week. Unfortunately they did not go  out to change the dressing. It is unclear why. Patient did not call them. She currently denies signs of infection. 7/5; patient presents for follow-up. We have been using Keystone antibiotic with calcium alginate under 3 layer compression. She reports improvement in wound healing. She denies signs of infection. Home health has come out to do dressing changes twice this past week. 7/12; patient presents for follow-up. We have been using Keystone antibiotic with calcium alginate under 3 layer compression. Patient states that home health came out once last week to change the dressing. She reports improvement in wound healing. She currently  denies signs of infection. 7/19; patient presents for follow-up. We have been using Keystone antibiotic with calcium alginate under 3 layer compression. Home health came out once last week to change the dressing. She has no issues or complaints today. She denies signs of infection. 8/2; patient presents for follow-up. We have been using Keystone antibiotic with calcium alginate under 3 layer compression. Unfortunately she missed her appointment last week and home health did not come out to do dressing changes. Patient currently denies signs of infection. 8/9; patient presents for follow-up. We have been using Keystone with calcium alginate under 3 layer compression. She states that home health came out once last week. She currently denies signs of infection. Her wrap was completely wet. She states she was cleaning the top of the leg and water soaked down into the wrap. 8/16; patient presents for follow-up. We have been using Keystone with calcium alginate under 3 layer compression. She states that home health came out twice last week. She has no issues or complaints today. 8/23; patient presents for follow-up. He has been using Keystone with calcium alginate under 3 layer compression. Home health came out twice last week. She denies signs of infection. 8/30; patient presents for follow-up. We have been using Keystone with calcium alginate under 3 layer compression. Home health came out once last week to change the dressing. Patient reports improvement in wound healing. She states she is almost done with her chemotherapy infusions and has 1 more left. 9/13; patient presents for follow-up. She has lost the capsules to her [facility] antibiotic which I believe is the vancomycin pills. She has her Zosyn powder today. We have been using Keystone antibiotic ointment with calcium alginate under 3 layer compression. She is concerned about systemic infection however her vitals are stable and there is no  surrounding soft tissue infection. She would like to remain a patient in our wound care center however would like a second opinion for her wound care at another facility. She asked to be referred to [facility] wound care center. ORNELLA, CODERRE ([id number]) [id number] Page 3 of 11 9/20; patient presents for follow-up. She found her vancomycin capsules and brought in her complete Keystone antibiotic ointment set today. Unfortunately she has developed skin breakdown and Erythema to the right lower extremity With increased swelling. She states she went to a pow wow Over the weekend and was on her feet for extended periods of time. She saw her oncologist yesterday who prescribed her doxycycline for her right lower extremity erythema. 9/27; patient presents for follow-up. We have been using Keystone antibiotic with Aquacel under 3 layer compression to the lower extremities bilaterally. When home health came and changed the wrap she secretly put coffee into the spray mix along with [facility] antibiotic on her leg thinking the acidic component would better activate the zoysn (sonething she discussed with her microbiologist brother). She has reported improvement in  wound healing. 10/4; patient presents for follow-up. She has no issues or complaints today. We have been doing Aquacel and keystone under 3 layer compression to the lower extremities bilaterally. This morning she took the right lower extremity wrap off as it was uncomfortable. She has no open wounds to this leg. 10/11; patient presents for follow-up. We have been doing Aquacel with Keystone antibiotic ointment under 3 layer compression to the left lower extremity. She developed a small blister to the anterior aspect of the left leg noticed when the wrap was taken off on intake. She currently denies signs of infection. 10/18; patient presents for follow-up. We have been doing Aquacel with Keystone antibiotic ointment under 3  layer compression to the left lower extremity. There has been continued improvement in wound healing. She denies signs of infection. 10/25; patient arrives for treatment of venous insufficiency ulcers on her left lower leg both lateral and medial are remanence of apparently a circumferential wound. Much improved. We are using topicals Keystone and Aquacel Ag under 3 layer compression we continue to make good progress. The patient talk to me at some length with regards to different things she has on her forehead and her Peri orbital area for which she is apparently applying [place]. She feels that what ever we are treating on her wounds is a more systemic problem. I really was not able to get a handle on what she is talking about however I did caution her not to put the [place] in her eyes. 11/1; her wounds continue to improve she is using Keystone and Aquacel Ag G under 3 layer compression. Our intake nurse notes erythema and edema in the right leg. The patient has a litany of concerns with regards to a rash on her forehead or ears and other systemic complaints. She has an appointment with dermatology on November 11 11/8; patient presents for follow-up. We have been using Keystone and Aquacel under 4-layer compression. She has no issues or complaints today. She reports improvement in wound healing. 11/15; patient presents for follow-up. We have been using Aquacel with Keystone antibiotic under 3 layer compression. Patient continues improvement in wound healing. 12/6; patient presents for follow-up. We have been using Aquacel with Keystone antibiotic ointment under 3 layer compression. Wounds appear well-healing. 12/13; patient presents for follow-up. We have been using Aquacel with Keystone antibiotic under 3 layer compression. She has no issues or complaints today. 12/27 left lateral medial ankle. Superficial wounds remain there is significantly improved we are using [employer] backed with Zetuvit  under [employer] information officer) Signed: 12/06/2022 4:44:55 PM By: Linton Ham MD Entered By: Linton Ham on 12/06/2022 11:58:20 -------------------------------------------------------------------------------- Physical Exam Details Patient Name: Date of Service: [name] 12/06/2022 10:30 A M Medical Record Number: [id number] Patient Account Number: [id number] Date of Birth/Sex: Treating RN: October 03, 1946 (76 y.o. [name] Primary Care Provider: [place] Other Clinician: [name] Referring Provider: Treating Provider/Extender: RO BSO N, [place] in Treatment: 64 Cardiovascular 1. Pedal pulses are palpable on the left. Notes Wound exam small wounds but superficial remain on the left lateral and left medial. 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 Electronic Signature(s) Signed: 12/06/2022 4:44:55 PM By: Linton Ham MD Entered By: Linton Ham on 12/06/2022 11:59:23 [name] ([id number]) [id number] Page 4 of 11 -------------------------------------------------------------------------------- Physician Orders Details Patient Name: Date of Service: [name] 12/06/2022 10:30 A  M Medical Record Number: [id number] Patient Account Number: [id number] Date of Birth/Sex: Treating RN: 1946/01/09 (76 y.o. [name] Primary Care Provider: [place] Other Clinician: [name] Referring Provider: Treating Provider/Extender: RO BSO N, [place] in Treatment: 5 Verbal / Phone Orders: No Diagnosis Coding Follow-up Appointments Return Appointment in 1 week. Nurse Visit as needed [place]: - [place] for wound care. May utilize formulary equivalent dressing for wound treatment orders unless otherwise specified. Home  Health Nurse may visit PRN to address patients wound care needs. **Please direct any NON-WOUND related issues/requests for orders to patient's Primary Care Physician. **If current dressing causes regression in wound condition, may D/C ordered dressing product/s and apply Normal Saline Moist Dressing daily until next [place] or Other MD appointment. **Notify Wound Healing Center of regression in wound condition at [phone]. Other Home Health Orders/Instructions: - Dressing change 3 x weekly, twice by home health and once at wound clinic weekly. Please make sure frequency between dressing changes is appropriate Bathing/ Shower/ Hygiene May shower with wound dressing protected with water repellent cover or cast protector. No tub bath. Anesthetic (Use 'Patient Medications' Section for Anesthetic Order Entry) Lidocaine applied to wound bed Edema Control - Lymphedema / Segmental Compressive Device / Other Optional: One layer of unna paste to top of compression wrap (to act as an anchor). - Please when applying wrap start from toes and go up just below the knee 4 Layer Compression System Lymphedema. - left lower leg Tubigrip single layer applied. - Tubi D right lower leg Elevate, Exercise Daily and A void Standing for Long Periods of Time. Elevate legs to the level of the heart and pump ankles as often as possible Elevate leg(s) parallel to the floor when sitting. DO YOUR BEST to sleep in the bed at night. DO NOT sleep in your recliner. Long hours of sitting in a recliner leads to swelling of the legs and/or potential wounds on your backside. Additional Orders / Instructions Follow Nutritious Diet and Increase Protein Intake Medications-Please add to medication list. Keystone Compound Wound Treatment Wound #1 - Lower Leg Wound Laterality: Left, Medial Cleanser: Wound Cleanser 1 x Per Day/30 Days Discharge Instructions: Wash your hands with soap and water. Remove old dressing,  discard into plastic bag and place into trash. Cleanse the wound with Wound Cleanser prior to applying a clean dressing using gauze sponges, not tissues or cotton balls. Do not scrub or use excessive force. Pat dry using gauze sponges, not tissue or cotton balls. Topical: Triamcinolone Acetonide Cream, 0.1%, 15 (g) tube 1 x Per Day/30 Days Discharge Instructions: Mix 1:1 with Nystatin cream and apply to periwound and leg Topical: Nystatin Cream, 15 (g) tube 1 x Per Day/30 Days Prim Dressing: Aquacel Extra Hydrofiber Dressing, 2x2 (in/in) ary 1 x Per Day/30 Days Secondary Dressing: Zetuvit Plus 4x8 (in/in) 1 x Per Day/30 Days Compression Wrap: Medichoice 4 layer Compression System, 35-40 mmHG 1 x Per Day/30 Days Discharge Instructions: Apply multi-layer wrap as directed. AUDINE, MANGIONE ([id number]) [id number] Page 5 of 11 Wound #8 - Lower Leg Wound Laterality: Left, Lateral Cleanser: Wound Cleanser 1 x Per Day/30 Days Discharge Instructions: Wash your hands with soap and water. Remove old dressing, discard into plastic bag and place into trash. Cleanse the wound with Wound Cleanser prior to applying a clean dressing using gauze sponges, not tissues or cotton balls. Do not scrub or use excessive  force. Pat dry using gauze sponges, not tissue or cotton balls. Topical: Triamcinolone Acetonide Cream, 0.1%, 15 (g) tube 1 x Per Day/30 Days Discharge Instructions: Mix 1:1 with Nystatin cream and apply to periwound and leg Topical: Nystatin Cream, 15 (g) tube 1 x Per Day/30 Days Prim Dressing: Aquacel Extra Hydrofiber Dressing, 2x2 (in/in) ary 1 x Per Day/30 Days Secondary Dressing: Zetuvit Plus 4x8 (in/in) 1 x Per Day/30 Days Electronic Signature(s) Signed: 12/06/2022 12:29:49 PM By: Gretta Cool, BSN, RN, CWS, [name] RN, BSN Signed: 12/06/2022 4:44:55 PM By: Linton Ham MD Entered By: Gretta Cool, BSN, RN, CWS, [name] on 12/06/2022  12:09:48 -------------------------------------------------------------------------------- Problem List Details Patient Name: Date of Service: [name], [name] [name] 12/06/2022 10:30 A M Medical Record Number: [id number] Patient Account Number: [id number] Date of Birth/Sex: Treating RN: [birth year] (76 y.o. [name] Primary Care Provider: [place] Other Clinician: [name] Referring Provider: Treating Provider/Extender: RO BSO N, [place] in Treatment: 74 Active Problems ICD-10 Encounter Code Description Active Date MDM Diagnosis 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sed11/23/2022 No Yes 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 11/02/2021 No Yes I87.311 Chronic venous hypertension (idiopathic) with ulcer of right lower extremity 08/30/2022 No Yes I87.2 Venous insufficiency (chronic) (peripheral) 07/06/2021 No Yes Z79.01 Long term (current) use of anticoagulants 07/06/2021 No Yes I10 Essential (primary) hypertension 07/06/2021 No Yes KIRRAH, MUSTIN ([id number]) [id number] Page 6 of 11 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 07/06/2021 No Yes Inactive Problems ICD-10 Code Description Active Date Inactive Date S81.802A Unspecified open wound, left lower leg, initial encounter 07/06/2021 07/06/2021 S91.101A Unspecified open wound of right great toe without damage to nail, initial encounter 08/24/2021 08/24/2021 S91.104A Unspecified open wound of right lesser toe(s) without damage to nail, initial encounter 08/24/2021 08/24/2021 Resolved Problems ICD-10 Code Description Active Date Resolved Date S91.104D Unspecified open wound of right lesser toe(s) without damage to nail, subsequent 08/31/2021 08/31/2021 encounter S91.201D Unspecified open wound of right great toe with damage to nail, subsequent encounter 08/31/2021  08/31/2021 Electronic Signature(s) Signed: 12/06/2022 4:44:55 PM By: Linton Ham MD Entered By: Linton Ham on 12/06/2022 11:56:47 -------------------------------------------------------------------------------- Progress Note Details Patient Name: Date of Service: [name] 12/06/2022 10:30 A M Medical Record Number: [id number] Patient Account Number: [id number] Date of Birth/Sex: Treating RN: May 20, 1946 (76 y.o. [name] Primary Care Provider: [place] Other Clinician: [name] Referring Provider: Treating Provider/Extender: RO BSO N, [place] in Treatment: 74 Subjective History of Present Illness (HPI) Admission 7/27 Ms. Malaka Ruffner is a 76 year old female with a past medical history of ADHD, metastatic breast cancer, stage IV chronic kidney disease, history of DVT on Xarelto and chronic venous insufficiency that presents to the clinic for a chronic left lower extremity wound. She recently moved to [facility] 4 days ago. She was being followed by wound care center in [name]. She reports a 10-year history of wounds to her left lower extremity that eventually do heal with debridement and compression therapy. She states that the current wound reopened 4 months ago and she is using Vaseline and Coban. She denies signs of infection. 8/3; patient presents for 1 week follow-up. She reports no issues or complaints today. She states she had vascular studies done in the last week. She denies signs of infection. She brought her little service dog with her today.  8/17; patient presents for follow-up. She has missed her last clinic appointment. She states she took the wrap off and attempted to rewrap her leg. She is having difficulty with transportation. She has her service dog with her today. Overall she feels well and reports improvement in wound healing. She denies signs of infection. She reports owning an  old Velcro wrap compression and has this at her living facility 9/14; patient presents for follow-up. Patient states that over the past 2 to 3 weeks she developed toe wounds to her right foot. She attributes this to tight fitting shoes. She subsequently developed cellulitis in the right leg and has been treated by doxycycline by her oncologist. She reports improvement in symptoms SAORY, CARRIERO ([id number]) [id number] Page 7 of 11 however continues to have some redness and swelling to this leg. T the left lower extremity patient has been having her wraps changed with home health twice weekly. She states that the [facility] is not helping control o the drainage. Other than that she has no issues or complaints today. She denies signs of infection to the left lower extremity. 9/21; patient presents for follow-up. She reports seeing infectious disease for her cellulitis. She reports no further management. She has home health that changes the wraps twice weekly. She has no issues or complaints today. She denies signs of infection. 10/5; patient presents for follow-up. She has no issues or complaints today. She denies signs of infection. She states that the right great toe has not been dressed by home health. 10/12; patient presents for follow-up. She has no issues or complaints today. She reports improvement in her wound healing. She has been using silver alginate to the right great toe wound. She denies signs of infection. 10/26; patient presents for follow-up. Home health did not have sorbact so they continued to use Hydrofera Blue under the wrap. She has been using silver alginate to the great toe wound however she did not have a dressing in place today. She currently denies signs of infection. 11/2; patient presents for follow-up. She has been using sorb act under the compression wrap. She reports using silver alginate to the toe wound again she does not have a  dressing in place. She currently denies signs of infection. 11/23; patient presents for follow-up. Unfortunately she has missed her last 2 clinic appointments. She was last seen 3 weeks ago. She did her own compression wrap with Kerlix and Coban yesterday after seeing vein and vascular. She has not been dressing her right great toe wound. She currently denies signs of infection. 11/30; patient presents for 1 week follow-up. She states she changed her dressing last week prior to home health and use sorb act with Dakin's and Hydrofera Blue. Home health has changed the dressing as well and they have been using sorbact. T oday she reports increased redness to her right lower extremity. She has a history of cellulitis to this leg. She has been using silver alginate to the right great toe. Unfortunately she had an episode of diarrhea prior to coming in and had feces all over the right leg and to the wrap of her left leg. 12/7; patient presents for 1 week follow-up. She states that home health did not come out to change the dressing and she took it off yesterday. It is unclear if she is dressing the right toe wound. She denies signs of infection. 12/14; patient presents for 1 week follow-up. She has no issues or complaints today. 12/21; patient presents for  follow-up. She has no issues or complaints today. She denies signs of infection. 12/28/2021; patient presents for follow-up. She was hospitalized for sepsis secondary to right lower extremity cellulitis On 12/23. She states she is currently at a SNF. She states that she was started on doxycycline this morning for her right great toe swelling and redness. She is not sure what dressings have been done to her left lower extremity for the past 3 weeks. She says its been mainly gauze with an [place] wrap. 1/25; patient presents for follow-up. She is still residing in a skilled nursing facility. She reports mild pain to the left lower extremity wound bed. She states  she is going to see a podiatrist soon. 2/8; patient presents for follow-up. She has moved back to her residential community from her skilled nursing facility. She has no issues or complaints today. She denies signs of systemic infections. 2/15; patient presents for follow-up. He has no issues or complaints today. She denies systemic signs of infection. 2/22; patient presents for follow-up. She has no issues or complaints today. She denies signs of infection. 3/1; patient presents for follow-up. She states that home health came out the day after she was seen in our clinic and yesterday to do the wrap change. She denies signs of infection. She reports excoriated skin on the ankle. 3/8; patient presents for follow-up. She has no issues or complaints today. She denies signs of infection. 3/15; patient presents for follow-up. Home health has been coming out to change the dressings. She reports more tenderness to the wound site. She denies purulent drainage, increased warmth or erythema to the area. 4/5; patient presents for follow-up. She has missed her last 2 clinic appointments. I have not seen her in 3 weeks. She was recently hospitalized for altered mental status. She was involuntarily committed. She was evaluated by psychiatry and deemed to have competency. There was no specific cause of her altered mental status. It was concluded that her physical and mental health were declining due to her chronic medical conditions. Currently home health has been coming out for dressing changes. Patient has also been doing her own dressing changes. She reports more skin breakdown to the periwound and now has a new wound. She denies fever/chills. She reports continued tenderness to the wound site. 4/12; patient with significant venous insufficiency and a large wound on her left lower leg taking up about 80% of the circumference of her lower leg. Cultures of this grew MRSA and Pseudomonas. She had completed a  course of ciprofloxacin now is starting doxycycline. She has been using Dakin's wet-to-dry and a Tubigrip. She has home health twice a week and we change it once. 4/19; patient presents for follow-up. She completed her course of doxycycline. She has been using Dakin's wet-to-dry dressing and Tubigrip. Home health changes the dressing twice weekly. Currently she has no issues or complaints. 4/26; patient presents for follow-up. At last clinic visit orders for home health were Iodosorb under compression therapy. Unfortunately they did not have the dressing and have been using Dakin's and gentamicin under the wrap. Patient currently denies signs of infection. She has no issues or complaints today. 5/3; patient presents for follow-up. Again Iodosorb has not been used under the compression therapy when home health comes out to change the wrap and dressing. They have been using Sorbact. It is unclear why this is happening since we send orders weekly to the agency. She denies signs of infection. Patient has not purchased the [place] antibiotics. We  reached out to the company and they said they have been trying to contact her on a regular basis. We gave the patient the number to call to order the medication. 5/10; patient presents for follow-up. She has no issues or complaints today. Again home health has not been using Iodosorb. Mepilex was on the wound bed. No other dressings noted. She brought in her Keystone antibiotics. She denies signs of infection. 5/17; patient presents for follow-up. Home health has come out twice since she was last seen. Joint well she has been using Keystone antibiotic with Sorbact under the compression wrap. She has no issues or complaints today. She denies signs of infection. 5/24; patient presents for follow-up. We have been using Keystone antibiotics with Sorbact under compression therapy. She is tolerating the treatment well. She is reporting improvement in wound healing. She  denies signs of infection. 5/31; patient presents for follow-up. We continue to do [facility] antibiotics with Sorbact under compression therapy. She continues to report improvement in wound healing. Home health comes out and changes the dressing once weekly. 05-17-2022 upon evaluation today patient appears to be doing better in regard to her wound especially compared to the last time I saw her. Fortunately I do think that she is seeing improvements. With that being said I do believe that she may be benefit from sharp debridement today to clear away some of the necrotic QUENISHA, LOVINS ([id number]) [id number] Page 8 of 11 debris I discussed that with her as well. She is an amendable to that plan. Otherwise she is very pleased with how the [employer] is doing for her. 6/14; patient presents for follow-up. We have been using Keystone antibiotic with Sorbact and absorbent dressings under 3 layer compression. She has no issues or complaints today. She reports improvement in wound healing. She denies signs of infection. 6/21; patient presents for follow-up. We are continuing with [facility] antibiotic and Sorbact under 3 layer compression. Patient has no complaints. Continued wound healing is happening. She denies signs of infection. 6/28; patient presents for follow-up. We have been using Keystone antibiotic with Sorbact under 3 layer compression. Usually home health comes out and changes the dressing twice a week. Unfortunately they did not go out to change the dressing. It is unclear why. Patient did not call them. She currently denies signs of infection. 7/5; patient presents for follow-up. We have been using Keystone antibiotic with calcium alginate under 3 layer compression. She reports improvement in wound healing. She denies signs of infection. Home health has come out to do dressing changes twice this past week. 7/12; patient presents for follow-up. We have been using  Keystone antibiotic with calcium alginate under 3 layer compression. Patient states that home health came out once last week to change the dressing. She reports improvement in wound healing. She currently denies signs of infection. 7/19; patient presents for follow-up. We have been using Keystone antibiotic with calcium alginate under 3 layer compression. Home health came out once last week to change the dressing. She has no issues or complaints today. She denies signs of infection. 8/2; patient presents for follow-up. We have been using Keystone antibiotic with calcium alginate under 3 layer compression. Unfortunately she missed her appointment last week and home health did not come out to do dressing changes. Patient currently denies signs of infection. 8/9; patient presents for follow-up. We have been using Keystone with calcium alginate under 3 layer compression. She states that home health came out once last week. She currently denies signs  of infection. Her wrap was completely wet. She states she was cleaning the top of the leg and water soaked down into the wrap. 8/16; patient presents for follow-up. We have been using Keystone with calcium alginate under 3 layer compression. She states that home health came out twice last week. She has no issues or complaints today. 8/23; patient presents for follow-up. He has been using Keystone with calcium alginate under 3 layer compression. Home health came out twice last week. She denies signs of infection. 8/30; patient presents for follow-up. We have been using Keystone with calcium alginate under 3 layer compression. Home health came out once last week to change the dressing. Patient reports improvement in wound healing. She states she is almost done with her chemotherapy infusions and has 1 more left. 9/13; patient presents for follow-up. She has lost the capsules to her [facility] antibiotic which I believe is the vancomycin pills. She has her Zosyn  powder today. We have been using Keystone antibiotic ointment with calcium alginate under 3 layer compression. She is concerned about systemic infection however her vitals are stable and there is no surrounding soft tissue infection. She would like to remain a patient in our wound care center however would like a second opinion for her wound care at another facility. She asked to be referred to [facility] wound care center. 9/20; patient presents for follow-up. She found her vancomycin capsules and brought in her complete Keystone antibiotic ointment set today. Unfortunately she has developed skin breakdown and Erythema to the right lower extremity With increased swelling. She states she went to a pow wow Over the weekend and was on her feet for extended periods of time. She saw her oncologist yesterday who prescribed her doxycycline for her right lower extremity erythema. 9/27; patient presents for follow-up. We have been using Keystone antibiotic with Aquacel under 3 layer compression to the lower extremities bilaterally. When home health came and changed the wrap she secretly put coffee into the spray mix along with [facility] antibiotic on her leg thinking the acidic component would better activate the zoysn (sonething she discussed with her microbiologist brother). She has reported improvement in wound healing. 10/4; patient presents for follow-up. She has no issues or complaints today. We have been doing Aquacel and keystone under 3 layer compression to the lower extremities bilaterally. This morning she took the right lower extremity wrap off as it was uncomfortable. She has no open wounds to this leg. 10/11; patient presents for follow-up. We have been doing Aquacel with Keystone antibiotic ointment under 3 layer compression to the left lower extremity. She developed a small blister to the anterior aspect of the left leg noticed when the wrap was taken off on intake. She currently denies signs of  infection. 10/18; patient presents for follow-up. We have been doing Aquacel with Keystone antibiotic ointment under 3 layer compression to the left lower extremity. There has been continued improvement in wound healing. She denies signs of infection. 10/25; patient arrives for treatment of venous insufficiency ulcers on her left lower leg both lateral and medial are remanence of apparently a circumferential wound. Much improved. We are using topicals Keystone and Aquacel Ag under 3 layer compression we continue to make good progress. The patient talk to me at some length with regards to different things she has on her forehead and her Peri orbital area for which she is apparently applying [place]. She feels that what ever we are treating on her wounds is a more systemic  problem. I really was not able to get a handle on what she is talking about however I did caution her not to put the [place] in her eyes. 11/1; her wounds continue to improve she is using Keystone and Aquacel Ag G under 3 layer compression. Our intake nurse notes erythema and edema in the right leg. The patient has a litany of concerns with regards to a rash on her forehead or ears and other systemic complaints. She has an appointment with dermatology on November 11 11/8; patient presents for follow-up. We have been using Keystone and Aquacel under 4-layer compression. She has no issues or complaints today. She reports improvement in wound healing. 11/15; patient presents for follow-up. We have been using Aquacel with Keystone antibiotic under 3 layer compression. Patient continues improvement in wound healing. 12/6; patient presents for follow-up. We have been using Aquacel with Keystone antibiotic ointment under 3 layer compression. Wounds appear well-healing. 12/13; patient presents for follow-up. We have been using Aquacel with Keystone antibiotic under 3 layer compression. She has no issues or complaints today. 12/27 left  lateral medial ankle. Superficial wounds remain there is significantly improved we are using Keystone backed with Zetuvit under 4-layer compression Objective MIABELLA, SHANNAHAN ([id number]) [id number] Page 9 of 11 Constitutional Vitals Time Taken: 11:13 AM, Height: 66 in, Weight: 153 lbs, BMI: 24.7, Temperature: 97.9 F, Pulse: 79 bpm, Respiratory Rate: 16 breaths/min, Blood Pressure: 129/72 mmHg. Cardiovascular 1. Pedal pulses are palpable on the left. General Notes: Wound exam small wounds but superficial remain on the left lateral and left medial. 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 Integumentary (Hair, Skin) Wound #1 status is Open. Original cause of wound was Gradually Appeared. The date acquired was: 04/06/2021. The wound has been in treatment 74 weeks. The wound is located on the Left,Medial Lower Leg. The wound measures 5.4cm length x 1.1cm width x 0.1cm depth; 4.665cm^2 area and 0.467cm^3 volume. There is Fat Layer (Subcutaneous Tissue) exposed. There is a medium amount of serosanguineous drainage noted. The wound margin is flat and intact. There is medium (34-66%) red, pink granulation within the wound bed. There is a medium (34-66%) amount of necrotic tissue within the wound bed including Adherent Slough. Wound #8 status is Open. Original cause of wound was Pressure Injury. The date acquired was: 10/18/2022. The wound has been in treatment 7 weeks. The wound is located on the Left,Lateral Lower Leg. The wound measures 0.2cm length x 0.5cm width x 0.1cm depth; 0.079cm^2 area and 0.008cm^3 volume. There is Fat Layer (Subcutaneous Tissue) exposed. There is a medium amount of serosanguineous drainage noted. There is no granulation within the wound bed. There is a small (1-33%) amount of necrotic tissue within the wound bed including Adherent Slough. Assessment Active Problems ICD-10 Non-pressure chronic ulcer of  other part of left lower leg with fat layer exposed Chronic venous hypertension (idiopathic) with ulcer of left lower extremity Chronic venous hypertension (idiopathic) with ulcer of right lower extremity Venous insufficiency (chronic) (peripheral) Long term (current) use of anticoagulants Essential (primary) hypertension Secondary malignant neoplasm of breast Procedures Wound #1 Pre-procedure diagnosis of Wound #1 is a Venous Leg Ulcer located on the Left,Medial Lower Leg . There was a Four Layer Compression Therapy Procedure with a pre-treatment ABI of 1.5 by [name]. Post procedure Diagnosis Wound #1: Same as Pre-Procedure Plan Follow-up Appointments: Return Appointment in 1 week. Nurse Visit as needed Home Health: [place]: - [place]  for wound care. May utilize formulary equivalent dressing for wound treatment orders unless otherwise specified. Home Health Nurse may visit PRN to address patientoos wound care needs. **Please direct any NON-WOUND related issues/requests for orders to patient's Primary Care Physician. **If current dressing causes regression in wound condition, may D/C ordered dressing product/s and apply Normal Saline Moist Dressing daily until next [place] or Other MD appointment. **Notify Wound Healing Center of regression in wound condition at [phone]. Other Home Health Orders/Instructions: - Dressing change 3 x weekly, twice by home health and once at wound clinic weekly. Please make sure frequency between dressing changes is appropriate Bathing/ Shower/ Hygiene: May shower with wound dressing protected with water repellent cover or cast protector. No tub bath. Anesthetic (Use 'Patient Medications' Section for Anesthetic Order Entry): Lidocaine applied to wound bed Edema Control - Lymphedema / Segmental Compressive Device / Other: Optional: One layer of unna paste to top of compression wrap (to act as an  anchor). - Please when applying wrap start from toes and go up just below the knee 4 Layer Compression System Lymphedema. - left lower leg Tubigrip single layer applied. - Tubi D right lower leg Elevate, Exercise Daily and Avoid Standing for Long Periods of Time. Elevate legs to the level of the heart and pump ankles as often as possible Elevate leg(s) parallel to the floor when sitting. DO YOUR BEST to sleep in the bed at night. DO NOT sleep in your recliner. Long hours of sitting in a recliner leads to swelling of the legs and/or potential wounds on your backside. Additional Orders / Instructions: Follow Nutritious Diet and Increase Protein Intake KYNSLEI, ART ([id number]) [id number] Page 10 of 11 Medications-Please add to medication list.: Keystone Compound WOUND #1: - Lower Leg Wound Laterality: Left, Medial Cleanser: Wound Cleanser 1 x Per Day/30 Days Discharge Instructions: Wash your hands with soap and water. Remove old dressing, discard into plastic bag and place into trash. Cleanse the wound with Wound Cleanser prior to applying a clean dressing using gauze sponges, not tissues or cotton balls. Do not scrub or use excessive force. Pat dry using gauze sponges, not tissue or cotton balls. Topical: Triamcinolone Acetonide Cream, 0.1%, 15 (g) tube 1 x Per Day/30 Days Discharge Instructions: Mix 1:1 with Nystatin cream and apply to periwound and leg Topical: Nystatin Cream, 15 (g) tube 1 x Per Day/30 Days Prim Dressing: Aquacel Extra Hydrofiber Dressing, 2x2 (in/in) 1 x Per Day/30 Days ary Secondary Dressing: Zetuvit Plus 4x8 (in/in) 1 x Per Day/30 Days Com pression Wrap: Medichoice 4 layer Compression System, 35-40 mmHG 1 x Per Day/30 Days Discharge Instructions: Apply multi-layer wrap as directed. WOUND #8: - Lower Leg Wound Laterality: Left, Lateral Cleanser: Wound Cleanser 1 x Per Day/30 Days Discharge Instructions: Wash your hands with soap and  water. Remove old dressing, discard into plastic bag and place into trash. Cleanse the wound with Wound Cleanser prior to applying a clean dressing using gauze sponges, not tissues or cotton balls. Do not scrub or use excessive force. Pat dry using gauze sponges, not tissue or cotton balls. Topical: Triamcinolone Acetonide Cream, 0.1%, 15 (g) tube 1 x Per Day/30 Days Discharge Instructions: Mix 1:1 with Nystatin cream and apply to periwound and leg Topical: Nystatin Cream, 15 (g) tube 1 x Per Day/30 Days Prim Dressing: Aquacel Extra Hydrofiber Dressing, 2x2 (in/in) 1 x Per Day/30 Days ary Secondary Dressing: Zetuvit Plus 4x8 (in/in) 1 x Per Day/30 Days 1. Absolutely no reason to change  the primary dressing which is [place] and Aquacel and 4-layer compression 2. These are close to closing I am not sure what she has in terms of compression stockings and whether she would use them or not. Electronic Signature(s) Signed: 12/06/2022 4:44:55 PM By: Linton Ham MD Entered By: Linton Ham on 12/06/2022 11:59:57 -------------------------------------------------------------------------------- SuperBill Details Patient Name: Date of Service: [name], [place]. 12/06/2022 Medical Record Number: [id number] Patient Account Number: [id number] Date of Birth/Sex: Treating RN: 10/24/1946 (76 y.o. 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 I87.311 Chronic venous hypertension (idiopathic) with ulcer of right lower extremity I87.2 Venous insufficiency (chronic) (peripheral) Z79.01 Long term (current) use of  anticoagulants I10 Essential (primary) hypertension 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 Facility Procedures : TASHANNA, DOLIN Code: [id number] Ralene Muskrat ([id number] Description: (Facility Use Only) [phone] - [place] LWR LT LEG ICD-10 Diagnosis Description 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sed 47) [id number] Modifier: [id number] Quantity: 1 Page 11 of 11 Physician Procedures : CPT4 Code Description Modifier [id number] - WC PHYS LEVEL 3 - EST PT ICD-10 Diagnosis Description 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.312 Chronic venous hypertension (idiopathic) with ulcer  of left lower extremity I87.311 Chronic venous hypertension (idiopathic) with ulcer of right lower extremity Quantity: 1 Electronic Signature(s) Signed: 12/06/2022 12:29:49 PM By: Gretta Cool, BSN, RN, CWS, [name] RN, BSN Signed: 12/06/2022 4:44:55 PM By: Linton Ham MD Entered By: Gretta Cool, BSN, RN, CWS, [name] on 12/06/2022 12:10:14

## 2022-12-06 NOTE — Progress Notes (Addendum)
Laura, Santiago ([id number]) [id number] Page 1 of 10 Visit Report for 12/06/2022 Arrival Information Details Patient Name: Date of Service: Laura Santiago. 12/06/2022 10:30 A M Medical Record Number: [id number] Patient Account Number: [id number] Date of Birth/Sex: Treating RN: [birth year] (76 y.o. Laura Santiago Primary Care Kimberl Vig: [place] Other Clinician: [name] Referring Simra Fiebig: Treating Misha Vanoverbeke/Extender: RO BSO N, [place] in Treatment: 73 Visit Information History Since Last Visit All ordered tests and consults were completed: No Patient Arrived: [name] Added or deleted any medications: No Arrival Time: 11:11 Any new allergies or adverse reactions: No Transfer Assistance: None Had a fall or experienced change in No Patient Identification Verified: Yes activities of daily living that may affect Secondary Verification Process Completed: Yes risk of falls: Patient Requires Transmission-Based No Signs or symptoms of abuse/neglect since last visito No Precautions: Hospitalized since last visit: No Patient Has Alerts: Yes Implantable device outside of the clinic excluding No Patient Alerts: PT HAS SERVICE cellular tissue based products placed in the center ANIMAL since last visit: ABI 07/11/21 Has Dressing in Place as Prescribed: Yes R) 1.16 L) 1.27 Has Compression in Place as Prescribed: Yes Pain Present Now: No Electronic Signature(s) Signed: 12/06/2022 12:29:49 PM By: Gretta Cool, BSN, RN, CWS, [name] RN, BSN Entered By: Gretta Cool, BSN, RN, CWS, [name] on 12/06/2022 11:12:23 -------------------------------------------------------------------------------- Clinic Level of Care Assessment Details Patient Name: Date of Service: [name], [name] [name] 12/06/2022 10:30 A M Medical Record Number: [id number] Patient Account Number: [id number] Date of Birth/Sex: Treating RN: 1946-01-22 (76 y.o. Laura Santiago Primary Care Laura Santiago: [place] Other Clinician: [name] Referring Woodruff Skirvin: Treating Haunani Dickard/Extender: RO BSO N, [place] in Treatment: 74 Clinic Level of Care Assessment Items TOOL 1 Quantity Score '[]'$  - 0 Use when EandM and Procedure is performed on INITIAL visit ASSESSMENTS - Nursing Assessment / Reassessment '[]'$  - 0 General Physical Exam (combine w/ comprehensive assessment (listed just below) when performed on new pt. [id number] Laura Santiago, Santiago ([id number]) [id number] Page 2 of 10 '[]'$  - 0 Comprehensive Assessment (HX, ROS, Risk Assessments, Wounds Hx, etc.) ASSESSMENTS - Wound and Skin Assessment / Reassessment '[]'$  - 0 Dermatologic / Skin Assessment (not related to wound area) ASSESSMENTS - Ostomy and/or Continence Assessment and Care '[]'$  - 0 Incontinence Assessment and Management '[]'$  - 0 Ostomy Care Assessment and Management (repouching, etc.) PROCESS - Coordination of Care '[]'$  - 0 Simple Patient / Family Education for ongoing care '[]'$  - 0 Complex (extensive) Patient / Family Education for ongoing care '[]'$  - 0 Staff obtains Programmer, systems, Records, T Results / Process Orders est '[]'$  - 0 Staff telephones HHA, Nursing Homes / Clarify orders / etc '[]'$  - 0 Routine Transfer to another Facility (non-emergent condition) '[]'$  - 0 Routine Hospital Admission (non-emergent condition) '[]'$  - 0 New Admissions / Biomedical engineer / Ordering NPWT Apligraf, etc. , '[]'$  - 0 Emergency Hospital Admission (emergent condition) PROCESS - Special Needs '[]'$  - 0 Pediatric / Minor Patient Management '[]'$  - 0 Isolation Patient Management '[]'$  - 0 Hearing / Language / Visual special needs '[]'$  - 0 Assessment of Community assistance (transportation, D/C planning, etc.) '[]'$  - 0 Additional assistance / Altered mentation '[]'$  - 0 Support Surface(s) Assessment (bed, cushion, seat, etc.) INTERVENTIONS - Miscellaneous '[]'$  - 0 External  ear exam '[]'$  - 0 Patient Transfer (multiple staff / Civil Service fast streamer / Similar devices) '[]'$  - 0 Simple Staple /  Suture removal (25 or less) '[]'$  - 0 Complex Staple / Suture removal (26 or more) '[]'$  - 0 Hypo/Hyperglycemic Management (do not check if billed separately) '[]'$  - 0 Ankle / Brachial Index (ABI) - do not check if billed separately Has the patient been seen at the hospital within the last three years: Yes Total Score: 0 Level Of Care: ____ Electronic Signature(s) Signed: 12/06/2022 12:29:49 PM By: Gretta Cool, BSN, RN, CWS, [name] RN, BSN Entered By: Gretta Cool, BSN, RN, CWS, [name] on 12/06/2022 12:09:56 -------------------------------------------------------------------------------- Compression Therapy Details Patient Name: Date of Service: Laura Jewett [name] 12/06/2022 10:30 A M Medical Record Number: [id number] Patient Account Number: [id number] Date of Birth/Sex: Treating RN: 1946/07/31 (76 y.o. Laura Santiago Primary Care Laura Santiago: [place] Other Clinician: Rogina, Schiano ([id number]) [id number] Page 3 of 10 Referring Jasiel Apachito: Treating Nikcole Eischeid/Extender: RO BSO N, Laura Santiago in Treatment: 74 Compression Therapy Performed for Wound Assessment: Wound #1 Left,Medial Lower Leg Performed By: [name], [name], Compression Type: Four Layer Pre Treatment ABI: 1.5 Post Procedure Diagnosis Same as Pre-procedure Electronic Signature(s) Signed: 12/06/2022 12:29:49 PM By: Gretta Cool, BSN, RN, CWS, [name] RN, BSN Entered By: Gretta Cool, BSN, RN, CWS, [name] on 12/06/2022 11:54:59 -------------------------------------------------------------------------------- Encounter Discharge Information Details Patient Name: Date of Service: Laura Jewett [name] 12/06/2022 10:30 A M Medical Record Number: [id number] Patient Account Number: [id number] Date of Birth/Sex: Treating RN: [birth year] (76 y.o. Laura Santiago Primary Care  Izzy Doubek: [place] Other Clinician: [name] Referring Jaleyah Longhi: Treating Keiasha Diep/Extender: RO BSO N, [place] in Treatment: 23 Encounter Discharge Information Items Discharge Condition: Stable Ambulatory Status: Walker Discharge Destination: Home Transportation: Private Auto Accompanied By: self Schedule Follow-up Appointment: Yes Clinical Summary of Care: Electronic Signature(s) Signed: 12/19/2022 10:27:37 AM By: [name] Entered By: [name] on 12/06/2022 16:13:19 -------------------------------------------------------------------------------- Lower Extremity Assessment Details Patient Name: Date of Service: Laura Jewett [name] 12/06/2022 10:30 A M Medical Record Number: [id number] Patient Account Number: [id number] Date of Birth/Sex: Treating RN: 06/14/1946 (75 y.o. Laura Santiago Primary Care Dhaval Woo: [facility], [place] Other Clinician: [name] Referring Nickholas Goldston: Treating Angeles Paolucci/Extender: Eldridge Dace, [place] in Treatment: [address] [place] M ([id number]) [id number] Page 4 of 10 Electronic Signature(s) Signed: 12/06/2022 12:29:49 PM By: Gretta Cool, BSN, RN, CWS, [name] RN, BSN Entered By: Gretta Cool, BSN, RN, CWS, [name] on 12/06/2022 11:28:19 -------------------------------------------------------------------------------- Multi Wound Chart Details Patient Name: Date of Service: Laura Jewett [name] 12/06/2022 10:30 A M Medical Record Number: [id number] Patient Account Number: [id number] Date of Birth/Sex: Treating RN: [birth year] (76 y.o. Laura Santiago Primary Care Margrette Wynia: [place] Other Clinician: [name] Referring Kaitrin Seybold: Treating Emonii Wienke/Extender: RO BSO N, [place] in Treatment: 74 Vital Signs Height(in): 66 Pulse(bpm): 79 Weight(lbs): 153 Blood Pressure(mmHg): 129/72 Body Mass Index(BMI):  24.7 Temperature(F): 97.9 Respiratory Rate(breaths/min): 16 [1:Photos:] [N/A:N/A] Left, Medial Lower Leg Left, Lateral Lower Leg N/A Wound Location: Gradually Appeared Pressure Injury N/A Wounding Event: Venous Leg Ulcer Pressure Ulcer N/A Primary Etiology: Hypertension, Osteoarthritis, ReceivedHypertension, Osteoarthritis, ReceivedN/A Comorbid History: Chemotherapy, Received Radiation Chemotherapy, Received Radiation 04/06/2021 10/18/2022 N/A Date Acquired: 74 7 N/A Weeks of Treatment: Open Open N/A Wound Status: No No N/A Wound Recurrence: Yes No N/A Clustered Wound: 5.4x1.1x0.1 0.2x0.5x0.1 N/A Measurements L x W x D (cm) 4.665 0.079 N/A A (cm) : rea 0.467  0.008 N/A Volume (cm) : 94.80% 98.10% N/A % Reduction in Area: 97.40% 99.00% N/A % Reduction in Volume: Full Thickness Without Exposed Category/Stage II N/A Classification: Support Structures Medium Medium N/A Exudate Amount: Serosanguineous Serosanguineous N/A Exudate Type: red, brown red, brown N/A Exudate Color: Flat and Intact N/A N/A Wound Margin: Medium (34-66%) None Present (0%) N/A Granulation Amount: Red, Pink N/A N/A Granulation Quality: Medium (34-66%) Small (1-33%) N/A Necrotic Amount: Fat Layer (Subcutaneous Tissue): Yes Fat Layer (Subcutaneous Tissue): Yes N/A Exposed Structures: Fascia: No Fascia: No Tendon: No Tendon: No Muscle: No Muscle: No Joint: No Joint: No Bone: No Bone: No Small (1-33%) None N/A Epithelialization: Treatment Notes ASHLIE, MCMENAMY ([id number]) [id number] Page 5 of 10 Electronic Signature(s) Signed: 12/06/2022 12:29:49 PM By: Gretta Cool, BSN, RN, CWS, [name] RN, BSN Entered By: Gretta Cool, BSN, RN, CWS, [name] on 12/06/2022 11:54:37 -------------------------------------------------------------------------------- [place] Details Patient Name: Date of Service: [name], [place] [name] 12/06/2022 10:30 A M Medical Record  Number: [id number] Patient Account Number: [id number] Date of Birth/Sex: Treating RN: Apr 12, 1946 (76 y.o. Laura Santiago Primary Care Mixtli Reno: [place] Other Clinician: [name] Referring Barrie Wale: Treating Sevan Mcbroom/Extender: RO BSO N, [place] in Treatment: 74 Active Inactive Necrotic Tissue Nursing Diagnoses: Impaired tissue integrity related to necrotic/devitalized tissue Knowledge deficit related to management of necrotic/devitalized tissue Goals: Necrotic/devitalized tissue will be minimized in the wound bed Date Initiated: 07/06/2021 Target Resolution Date: 12/13/2022 Goal Status: Active Patient/caregiver will verbalize understanding of reason and process for debridement of necrotic tissue Date Initiated: 07/06/2021 Date Inactivated: 10/05/2021 Target Resolution Date: 07/06/2021 Goal Status: Met Interventions: Assess patient pain level pre-, during and post procedure and prior to discharge Provide education on necrotic tissue and debridement process Treatment Activities: Apply topical anesthetic as ordered : 07/06/2021 Biologic debridement : 07/06/2021 Enzymatic debridement : 07/06/2021 Excisional debridement : 07/06/2021 Notes: Soft Tissue Infection Nursing Diagnoses: Impaired tissue integrity Potential for infection: soft tissue Goals: Patient's soft tissue infection will resolve Date Initiated: 03/15/2022 Target Resolution Date: 12/13/2022 Goal Status: Active Signs and symptoms of infection will be recognized early to allow for prompt treatment Date Initiated: 03/15/2022 Target Resolution Date: 12/13/2022 Goal Status: Active Interventions: Assess signs and symptoms of infection every visit Treatment Activities: AUBREE, DOODY ([id number]) [id number] Page 6 of 10 Culture and sensitivity : 03/15/2022 Notes: Electronic Signature(s) Signed: 12/07/2022 9:37:21 AM By: Carlene Coria RN Signed: 12/19/2022  10:27:37 AM By: [name] Entered By: [name] on 12/06/2022 16:12:28 -------------------------------------------------------------------------------- Pain Assessment Details Patient Name: Date of Service: Laura Jewett [name] 12/06/2022 10:30 A M Medical Record Number: [id number] Patient Account Number: [id number] Date of Birth/Sex: Treating RN: Oct 17, 1946 (76 y.o. Laura Santiago Primary Care Ismail Graziani: [place] Other Clinician: [name] Referring Analeese Andreatta: Treating Johncarlos Holtsclaw/Extender: RO BSO N, [place] in Treatment: 74 Active Problems Location of Pain Severity and Description of Pain Patient Has Paino No Site Locations Pain Management and Medication Current Pain Management: Electronic Signature(s) Signed: 12/06/2022 12:29:49 PM By: Gretta Cool, BSN, RN, CWS, [name] RN, BSN Entered By: Gretta Cool, BSN, RN, CWS, [name] on 12/06/2022 11:18:38 [name] ([id number]) [id number] Page 7 of 10 -------------------------------------------------------------------------------- Patient/Caregiver Education Details Patient Name: Date of Service: Laura Santiago. 12/27/2023andnbsp10:30 A M Medical Record Number: [id number] Patient Account Number: [id number] Date of Birth/Gender: Treating RN: [birth year] (76 y.o. Laura Santiago Primary Care Physician: [place] Other Clinician: [name]  Referring Physician: Treating Physician/Extender: RO BSO N, [place] We[place] in Treatment: 25 Education Assessment Education Provided To: Patient Education Topics Provided Wound/Skin Impairment: Handouts: Other: continue wound care as directed Methods: Explain/Verbal Responses: State content correctly Electronic Signature(s) Signed: 12/19/2022 10:27:37 AM By: [name] Entered By: [name] on 12/06/2022  16:11:36 -------------------------------------------------------------------------------- Wound Assessment Details Patient Name: Date of Service: Laura Jewett [name] 12/06/2022 10:30 A M Medical Record Number: [id number] Patient Account Number: [id number] Date of Birth/Sex: Treating RN: 1946/11/04 (76 y.o. Laura Santiago Primary Care Tonyetta Berko: [place] Other Clinician: [name] Referring Otniel Hoe: Treating Alanea Woolridge/Extender: RO BSO N, [place] in Treatment: 74 Wound Status Wound Number: 1 Primary Venous Leg Ulcer Etiology: Wound Location: Left, Medial Lower Leg Wound Status: Open Wounding Event: Gradually Appeared Comorbid Hypertension, Osteoarthritis, Received Chemotherapy, Date Acquired: 04/06/2021 History: Received Radiation Weeks Of Treatment: 74 Clustered Wound: Yes Photos ZENOBIA, KUENNEN ([id number]) [id number] Page 8 of 10 Wound Measurements Length: (cm) 5.4 Width: (cm) 1.1 Depth: (cm) 0.1 Area: (cm) 4.665 Volume: (cm) 0.467 % Reduction in Area: 94.8% % Reduction in Volume: 97.4% Epithelialization: Small (1-33%) Wound Description Classification: Full Thickness Without Exposed Suppor Wound Margin: Flat and Intact Exudate Amount: Medium Exudate Type: Serosanguineous Exudate Color: red, brown t Structures Foul Odor After Cleansing: No Slough/Fibrino Yes Wound Bed Granulation Amount: Medium (34-66%) Exposed Structure Granulation Quality: Red, Pink Fascia Exposed: No Necrotic Amount: Medium (34-66%) Fat Layer (Subcutaneous Tissue) Exposed: Yes Necrotic Quality: Adherent Slough Tendon Exposed: No Muscle Exposed: No Joint Exposed: No Bone Exposed: No Electronic Signature(s) Signed: 12/06/2022 12:29:49 PM By: Gretta Cool, BSN, RN, CWS, [name] RN, BSN Entered By: Gretta Cool, BSN, RN, CWS, [name] on 12/06/2022  11:27:21 -------------------------------------------------------------------------------- Wound Assessment Details Patient Name: Date of Service: Laura Jewett [name] 12/06/2022 10:30 A M Medical Record Number: [id number] Patient Account Number: [id number] Date of Birth/Sex: Treating RN: [birth year] (76 y.o. Laura Santiago Primary Care Ah Bott: [place] Other Clinician: [name] Referring Cayde Held: Treating Orphia Mctigue/Extender: RO BSO N, [place] in Treatment: 74 Wound Status Wound Number: 8 Primary Pressure Ulcer Etiology: Wound Location: Left, Lateral Lower Leg Wound Status: Open Wounding Event: Pressure Injury Comorbid Hypertension, Osteoarthritis, Received Chemotherapy, Date Acquired: 10/18/2022 History: Received Radiation Weeks Of Treatment: 7 Clustered Wound: No Photos PEARLA, MCKINNY ([id number]) [id number] Page 9 of 10 Wound Measurements Length: (cm) 0.2 Width: (cm) 0.5 Depth: (cm) 0.1 Area: (cm) 0.079 Volume: (cm) 0.008 % Reduction in Area: 98.1% % Reduction in Volume: 99% Epithelialization: None Wound Description Classification: Category/Stage II Exudate Amount: Medium Exudate Type: Serosanguineous Exudate Color: red, brown Foul Odor After Cleansing: No Slough/Fibrino Yes Wound Bed Granulation Amount: None Present (0%) Exposed Structure Necrotic Amount: Small (1-33%) Fascia Exposed: No Necrotic Quality: Adherent Slough Fat Layer (Subcutaneous Tissue) Exposed: Yes Tendon Exposed: No Muscle Exposed: No Joint Exposed: No Bone Exposed: No Electronic Signature(s) Signed: 12/06/2022 12:29:49 PM By: Gretta Cool, BSN, RN, CWS, [name] RN, BSN Entered By: Gretta Cool, BSN, RN, CWS, [name] on 12/06/2022 11:28:05 -------------------------------------------------------------------------------- Vitals Details Patient Name: Date of Service: [name], [name] [name] 12/06/2022 10:30 A M Medical Record Number:  [id number] Patient Account Number: [id number] Date of Birth/Sex: Treating RN: [birth year] (76 y.o. Laura Santiago Primary Care Patrice Moates: [facility] Other Clinician: [name] Referring Nissan Frazzini: Treating Chima Astorino/Extender: RO BSO N, [place] in Treatment: 63 Vital  Signs Time Taken: 11:13 Temperature (F): 97.9 Height (in): 66 Pulse (bpm): 79 Weight (lbs): 153 Respiratory Rate (breaths/min): 16 Body Mass Index (BMI): 24.7 Blood Pressure (mmHg): 129/72 Reference Range: 80 - 120 mg / dl Electronic Signature(s) Signed: 12/06/2022 12:29:49 PM By: Gretta Cool, BSN, RN, CWS, [name] RN, BSN Entered By: Gretta Cool, BSN, RN, CWS, [name] on 12/06/2022 11:18:21 [name] ([id number]) [id number] Page 10 of 10

## 2022-12-07 ENCOUNTER — Encounter: Payer: Self-pay | Admitting: Dermatology

## 2022-12-07 ENCOUNTER — Telehealth: Payer: Self-pay | Admitting: Hospice

## 2022-12-07 ENCOUNTER — Ambulatory Visit (INDEPENDENT_AMBULATORY_CARE_PROVIDER_SITE_OTHER): Payer: Medicare Other | Admitting: Dermatology

## 2022-12-07 DIAGNOSIS — L219 Seborrheic dermatitis, unspecified: Secondary | ICD-10-CM

## 2022-12-07 DIAGNOSIS — Z515 Encounter for palliative care: Secondary | ICD-10-CM

## 2022-12-07 MED ORDER — FLUOCINONIDE 0.05 % EX SOLN: CUTANEOUS | 2 refills | Status: DC

## 2022-12-07 NOTE — Progress Notes (Signed)
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  She had a screening mammogram in September 2014 which showed 2.1 x 2.3 x 1.8 cm irregular mass in her right breast.  It was ER 10% positive PR negative and HER2 positive +3.  She received neoadjuvant chemotherapy with Taxol Herceptin and Perjeta for 4 cycles followed by dose dense AC/Herceptin x4 which she completed in March 2015.  She had a right lumpectomy on 04/03/2014 which showed scant residual invasive ductal carcinoma [id number].  She completed 1 year of adjuvant Herceptin chemotherapy and also completed adjuvant radiation treatment.  She was recommended anastrozole which she took on and off starting November 2015 and stopped sometime in 2020.   She was then hospitalized with neck pain and was found to have lytic lesions involving C5-C6 with pathological vertebral fractures.  She underwent radiation treatment to this area.  Image guided biopsy of the L1 vertebral body showed metastatic carcinoma consistent with breast origin ER 10% PR 0% and HER2 amplified ratio 5.1 average HER2 signal number per cell 15.0 average CEP 17 signals number per cell 3.0.  Baseline echocardiogram on 05/02/2021 showed a normal EF of 62% she was recommended Taxol Herceptin and Perjeta  which she received for 2 cycles at [facility] until June 10, 2021   She has chronic pain from her bone metastases for which she is currently on oxycodone 10 mg every 4 hours as needed and 12 mcg fentanyl patch.  She was seeing pain clinic when she was living in [name].   Patient has also been on Zometa when she was in [name] but she does have some ongoing dental issues.  She has received Xgeva in the past as well.  Her last Delton See was in July 2021.  Last PET scan was on 03/21/2021 which showed diffuse osseous metastatic disease involving the head neck chest abdomen and pelvis and spine.  Left lung apex hypermetabolic nodule and multiple hypermetabolic liver lesions concerning for disease involvement.  Enlarged hypermetabolic left inguinal lymph nodes along with hypermetabolic external iliac and left supraclavicular lymph nodes   Patient is now moved to [place] to be close to her daughter.  She lives in an independent living.  Patient received Taxol Herceptin and Perjeta for about a year and is presently on maintenance Herceptin and Perjeta    Interval history-patient reports worsening back pain.  Denies any worsening cough or shortness of breath.  She has chronic left lower extremity wound.  ECOG PS- 2 Pain scale- 0   Review of systems- Review of Systems  Constitutional:  Positive for malaise/fatigue. Negative for chills, fever and weight loss.  HENT:  Negative for congestion, ear discharge and nosebleeds.   Eyes:  Negative for blurred vision.  Respiratory:  Negative for cough, hemoptysis, sputum production and wheezing.   Cardiovascular:  Negative for chest pain, palpitations, orthopnea and claudication.  Gastrointestinal:  Negative for abdominal pain, blood in stool, constipation, diarrhea, heartburn, melena, nausea and vomiting.  Genitourinary:  Negative for dysuria, flank pain, frequency, hematuria and urgency.  Musculoskeletal:  Positive for back pain. Negative for joint pain and myalgias.   Skin:  Negative for rash.  Neurological:  Negative for dizziness, tingling, focal weakness, seizures, weakness and headaches.  Endo/Heme/Allergies:  Does not bruise/bleed easily.  Psychiatric/Behavioral:  Negative for depression and suicidal ideas. The patient does not have insomnia.       Allergies  Allergen Reactions   Other Diarrhea and Nausea And Vomiting    Pt has had episodes of nausea, vomiting and diarrhea right after receiving IV technetium for NM study on two occasions.   Technetium Tc 73mMedronate Diarrhea and Nausea And Vomiting    Pt has had episodes of nausea, vomiting and diarrhea right after receiving IV technetium for NM study on two occasions.    Corticosteroids Other (See Comments)    [place]. Notes that it causes agitation intolerance   Sulfa Antibiotics Other (See Comments)    [place] has allergy but we do not know reactions when taking the drug   Celebrex [Celecoxib] Rash     Past Medical History:  Diagnosis Date   ADHD (attention deficit hyperactivity disorder)    [place] note   Anemia    IDA 11/26/2019, Anemia in stage 4 chronic kidney disease ([place] 09/03/2020   Arthritis    osteoarthritis right knee 09/30/2014   Breast cancer ([place] 10[place]05[place]2013   [place] 2 is 3+   Cancer related pain 03/28/2021   [place]   cervical compression fracture 03[place]18/2021   [place]   DVT of lower extremity, bilateral ([place] 03/30/2014   [place]  Generalized muscle weakness 03/31/2016   [place]  GERD (gastroesophageal reflux disease) 05/15/2013   [place]  Hyperparathyroidism, secondary ([place] 04/25/2018   [place]  Hypertension 02/20/2016   [place]   [place]  Metabolic acidosis 095/62/1308  [place]  Metabolic syndrome 065/78/4696  [place]  Osteopenia after menopause 03/29/2016   [place]   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   [place]  Stasis ulcer of left lower extremity ([place] 03/29/2016   [place]    Past Surgical History:  Procedure Laterality Date   CESAREAN SECTION     unknown   fibroid removed  N/A    [place]   IR FLUORO GUIDE CV LINE LEFT  07/27/2021   IR PORT REPAIR CENTRAL VENOUS ACCESS DEVICE Left    [place]   [place]      unknown   PORTA CATH INSERTION N/A 02/13/2022   Procedure: PORTA CATH INSERTION;  Surgeon: DAlgernon Huxley MD;  [facility];  Service: Cardiovascular;  Laterality: N/A;   [place] LENS Bilateral  Bilateral 09/05/2012   [place]    [place]    LUM[place]OMY Right 04[place]03/2014   [place]   Social History   Socioeconomic History   Marital status: Divorced    Spouse name: Not on file  Number of children: Not on file   Years of education: Not on file   Highest education level: Not on file  Occupational History   Occupation: retired Teacher, music    Comment: [place]  Tobacco Use   Smoking status: Never   Smokeless tobacco: Never  Vaping Use   Vaping Use: Never used  Substance and Sexual Activity   Alcohol use: Not Currently   Drug use: Not Currently   Sexual activity: Not Currently  Other Topics Concern   Not on file  Social History Narrative   Not on file   Social Determinants of Health   Financial Resource Strain: Low Risk  (03/17/2022)   Overall Financial Resource Strain (CARDIA)    Difficulty of Paying Living Expenses: Not hard at all  Food Insecurity: No Food Insecurity (03/17/2022)   Hunger Vital Sign    Worried About Running Out of Food in the Last Year: Never true    Ran Out of Food in the Last Year: Never true  Transportation Needs: No Transportation Needs (03/17/2022)   PRAPARE - Hydrologist (Medical): No    Lack of Transportation (Non-Medical): No   Physical Activity: Inactive (03/17/2022)   Exercise Vital Sign    Days of Exercise per Week: 0 days    Minutes of Exercise per Session: 0 min  Stress: No Stress Concern Present (03/17/2022)   [place]    Feeling of Stress : Only a little  Social Connections: Socially Isolated (03/17/2022)   Social Connection and Isolation Panel [NHANES]    Frequency of Communication with Friends and Family: Twice a week    Frequency of Social Gatherings with Friends and Family: Twice a week    Attends Religious Services: Never    Marine scientist or Organizations: No    Attends Archivist Meetings: Never    Marital Status: Widowed  Intimate Partner Violence: Not At Risk (03/17/2022)   Humiliation, Afraid, Rape, and Kick questionnaire    Fear of Current or Ex-Partner: No    Emotionally Abused: No    Physically Abused: No    Sexually Abused: No    Family History  Problem Relation Age of Onset   Pancreatic cancer Mother    Stroke Father    Diabetes Father    Hypertension Father    Heart disease Father    Skin cancer Father    Varicose Veins Father    Skin cancer Brother    Cancer - Prostate Brother      Current Outpatient Medications:    acetaminophen (TYLENOL) 500 MG tablet, Take 500 mg by mouth every 4 (four) hours as needed., Disp: , Rfl:    amphetamine-dextroamphetamine (ADDERALL XR) 30 MG 24 hr capsule, Take 1 capsule (30 mg total) by mouth daily., Disp: 30 capsule, Rfl: 0   doxycycline (VIBRA-TABS) 100 MG tablet, Take 1 tablet (100 mg total) by mouth 2 (two) times daily., Disp: 14 tablet, Rfl: 0   fentaNYL (DURAGESIC) 25 MCG/HR, PLACE 1 PATCH ONTO THE SKIN EVERY THREE DAYS AS DIRECTED., Disp: 10 patch, Rfl: 0   gabapentin (NEURONTIN) 100 MG capsule, TAKE 1 CAPSULE BY MOUTH 3 TIMES DAILY, Disp: 90 capsule, Rfl: 1   hydrocortisone 2.5 % cream, Apply 1 to 2 times daily for itchy rash at face, scalp, ears, and  forehead., Disp: 30 g, Rfl: 3   ketoconazole (NIZORAL) 2 % shampoo, apply every other day  massage into scalp and leave in for 5 minutes before rinsing out, Disp: 120 mL, Rfl: 3   lisinopril (ZESTRIL) 20 MG tablet, Take 1 tablet (20 mg total) by mouth daily., Disp: 30 tablet, Rfl: 3   LORazepam (ATIVAN) 0.5 MG tablet, Take 1 tablet (0.5 mg total) by mouth daily as needed for anxiety., Disp: 30 tablet, Rfl: 0   Multiple Vitamin (MULTI-VITAMIN) tablet, Take 1 tablet by mouth daily., Disp: , Rfl:    NON FORMULARY, Apply 1 Dose topically daily. Pt mixes up piperacillim and tazobactam and puts it on the wounds of left leg, Disp: , Rfl:    Oxycodone HCl 10 MG TABS, Take 1.5 tablets (15 mg total) by mouth every 6 (six) hours as needed., Disp: 90 tablet, Rfl: 0   rivaroxaban (XARELTO) 20 MG TABS tablet, Take 1 tablet (20 mg total) by mouth daily with supper., Disp: 30 tablet, Rfl: 5   sodium hypochlorite (DAKIN'S 1/4 STRENGTH) 0.125 % SOLN, Apply 1 application topically as directed. Moisten gauze with solution and wrap wound, Disp: , Rfl:    calcium citrate (CALCITRATE - DOSED IN MG ELEMENTAL CALCIUM) 950 (200 Ca) MG tablet, Take 200 mg of elemental calcium by mouth daily. (Patient not taking: Reported on 10/10/2022), Disp: , Rfl:    DULoxetine (CYMBALTA) 30 MG capsule, Take by mouth. (Patient not taking: Reported on 06/06/2022), Disp: , Rfl:    gentamicin cream (GARAMYCIN) 0.1 %, SMARTSIG:1 Application Topical (Patient not taking: Reported on 03/14/2022), Disp: , Rfl:    hydrOXYzine (ATARAX) 25 MG tablet, Take by mouth. (Patient not taking: Reported on 08/15/2022), Disp: , Rfl:    naloxone (NARCAN) nasal spray 4 mg/0.1 mL, SPRAY 1 SPRAY INTO ONE NOSTRIL AS DIRECTED FOR OPIOID OVERDOSE (TURN PERSON ON SIDE AFTER DOSE. IF NO RESPONSE IN 2-3 MINUTES OR PERSON RESPONDS BUT RELAPSES, REPEAT USING A NEW SPRAY DEVICE AND SPRAY INTO THE OTHER NOSTRIL. CALL 911 AFTER USE.) * EMERGENCY USE ONLY * (Patient not taking:  Reported on 08/29/2022), Disp: 1 each, Rfl: 0   ondansetron (ZOFRAN) 8 MG tablet, Take 1 tablet (8 mg total) by mouth as needed for nausea or vomiting. (Patient not taking: Reported on 08/29/2022), Disp: 30 tablet, Rfl: 0 No current facility-administered medications for this visit.  Facility-Administered Medications Ordered in Other Visits:    heparin lock flush 100 UNIT/ML injection, , , ,   Physical exam:  Vitals:   12/05/22 1318  BP: (!) 151/80  Pulse: 86  Resp: 20  Temp: 98.7 F (37.1 C)  SpO2: 100%  Weight: 143 lb 3.2 oz (65 kg)   Physical Exam Cardiovascular:     Rate and Rhythm: Normal rate and regular rhythm.     Heart sounds: Normal heart sounds.  Pulmonary:     Effort: Pulmonary effort is normal.     Breath sounds: Normal breath sounds.  Abdominal:     General: Bowel sounds are normal.     Palpations: Abdomen is soft.  Musculoskeletal:     Comments: Lateral +1 edema.  Dressing in place over left lower extremity.  Skin:    General: Skin is warm and dry.  Neurological:     Mental Status: She is alert and oriented to person, place, and time.         Latest Ref Rng & Units 11/14/2022    8:55 AM  CMP  Glucose 70 - 99 mg/dL 112   BUN 8 - 23 mg/dL 27   Creatinine 0.44 - 1.00 mg/dL 1.28  Sodium 135 - 145 mmol/L 141   Potassium 3.5 - 5.1 mmol/L 4.1   Chloride 98 - 111 mmol/L 106   CO2 22 - 32 mmol/L 27   Calcium 8.9 - 10.3 mg/dL 9.5   Total Protein 6.5 - 8.1 g/dL 7.4   Total Bilirubin 0.3 - 1.2 mg/dL 0.3   Alkaline Phos 38 - 126 U/L 69   AST 15 - 41 U/L 20   ALT 0 - 44 U/L 13       Latest Ref Rng & Units 11/14/2022    8:55 AM  CBC  WBC 4.0 - 10.5 K/uL 4.5   Hemoglobin 12.0 - 15.0 g/dL 10.1   Hematocrit 36.0 - 46.0 % 33.6   Platelets 150 - 400 K/uL 317     No images are attached to the encounter.  ECHOCARDIOGRAM COMPLETE  Result Date: 11/30/2022    ECHOCARDIOGRAM REPORT   Patient Name:   SYDNEY HASTEN Date of Exam: 11/30/2022 Medical Rec #:   [id number]        Height:       65.0 in Accession #:    [id number]       Weight:       142.2 lb Date of Birth:  1946-04-29        BSA:          89.711 m Patient Age:    9 years         BP:           145/81 mmHg Patient Gender: F                HR:           74 bpm. Exam Location:  [facility] Procedure: 2D Echo, Color Doppler and Cardiac Doppler Indications:     Breast cancer; Encounter for monitoring cardiotoxic drug                  therapy  History:         Patient has prior history of Echocardiogram examinations, most                  recent 10/25/2022. Risk Factors:Hypertension.  Sonographer:     Charmayne Sheer Referring Phys:  [id number] [place] Anna [name] Diagnosing Phys: Ida Rogue MD IMPRESSIONS  1. Left ventricular ejection fraction, by estimation, is 35 to 40%. The left ventricle has moderately decreased function. The left ventricle demonstrates global hypokinesis. Left ventricular diastolic parameters are consistent with Grade II diastolic dysfunction (pseudonormalization). The average left ventricular global longitudinal strain is -13.6 %.  2. Right ventricular systolic function is normal. The right ventricular size is normal.  3. Left atrial size was mildly dilated.  4. The mitral valve is normal in structure. Mild to moderate mitral valve regurgitation. No evidence of mitral stenosis.  5. The aortic valve is normal in structure. Aortic valve regurgitation is mild. Aortic valve sclerosis/calcification is present, without any evidence of aortic stenosis.  6. The inferior vena cava is normal in size with greater than 50% respiratory variability, suggesting right atrial pressure of 3 mmHg. FINDINGS  Left Ventricle: Left ventricular ejection fraction, by estimation, is 35 to 40%. The left ventricle has moderately decreased function. The left ventricle demonstrates global hypokinesis. The average left ventricular global longitudinal strain is -13.6 %. The left ventricular internal cavity size was normal in size. There is  no left ventricular hypertrophy. Left ventricular diastolic parameters are consistent with Grade II diastolic dysfunction (pseudonormalization). Right Ventricle:  The right ventricular size is normal. No increase in right ventricular wall thickness. Right ventricular systolic function is normal. Left Atrium: Left atrial size was mildly dilated. Right Atrium: Right atrial size was normal in size. Pericardium: There is no evidence of pericardial effusion. Mitral Valve: The mitral valve is normal in structure. There is mild calcification of the mitral valve leaflet(s). Mild to moderate mitral valve regurgitation. No evidence of mitral valve stenosis. Tricuspid Valve: The tricuspid valve is normal in structure. Tricuspid valve regurgitation is mild . No evidence of tricuspid stenosis. Aortic Valve: The aortic valve is normal in structure. Aortic valve regurgitation is mild. Aortic valve sclerosis/calcification is present, without any evidence of aortic stenosis. Aortic valve mean gradient measures 6.0 mmHg. Aortic valve peak gradient measures 10.4 mmHg. Aortic valve area, by VTI measures 2.31 cm. Pulmonic Valve: The pulmonic valve was normal in structure. Pulmonic valve regurgitation is not visualized. No evidence of pulmonic stenosis. Aorta: The aortic root is normal in size and structure. Venous: The inferior vena cava is normal in size with greater than 50% respiratory variability, suggesting right atrial pressure of 3 mmHg. IAS/Shunts: No atrial level shunt detected by color flow Doppler.  LEFT VENTRICLE PLAX 2D LVIDd:         4.30 cm      Diastology LVIDs:         3.40 cm      LV e' medial:    7.51 cm/s LV PW:         1.30 cm      LV E/e' medial:  14.8 LV IVS:        0.90 cm      LV e' lateral:   12.80 cm/s LVOT diam:     2.00 cm      LV E/e' lateral: 8.7 LV SV:         72 LV SV Index:   42           2D Longitudinal Strain LVOT Area:     3.14 cm     2D Strain GLS Avg:     -13.6 %  LV Volumes (MOD) LV vol d, MOD  A2C: 150.0 ml LV vol d, MOD A4C: 135.0 ml LV vol s, MOD A2C: 84.8 ml LV vol s, MOD A4C: 73.6 ml LV SV MOD A2C:     65.2 ml LV SV MOD A4C:     135.0 ml LV SV MOD BP:      65.9 ml RIGHT VENTRICLE RV Basal diam:  3.40 cm RV S prime:     13.90 cm/s LEFT ATRIUM              Index        RIGHT ATRIUM           Index LA diam:        4.80 cm  2.81 cm/m   RA Area:     16.00 cm LA Vol (A2C):   108.0 ml 63.11 ml/m  RA Volume:   41.10 ml  24.02 ml/m LA Vol (A4C):   67.9 ml  39.68 ml/m LA Biplane Vol: 85.4 ml  49.91 ml/m  AORTIC VALVE                     PULMONIC VALVE AV Area (Vmax):    2.44 cm      PV Vmax:       1.31 m/s AV Area (Vmean):   2.56 cm  PV Vmean:      87.400 cm/s AV Area (VTI):     2.31 cm      PV VTI:        0.230 m AV Vmax:           161.00 cm/s   PV Peak grad:  6.9 mmHg AV Vmean:          114.000 cm/s  PV Mean grad:  4.0 mmHg AV VTI:            0.312 m AV Peak Grad:      10.4 mmHg AV Mean Grad:      6.0 mmHg LVOT Vmax:         125.00 cm/s LVOT Vmean:        92.900 cm/s LVOT VTI:          0.229 m LVOT/AV VTI ratio: 0.73  AORTA Ao Root diam: 2.90 cm MITRAL VALVE                TRICUSPID VALVE MV Area (PHT): 3.85 cm     TR Peak grad:   28.5 mmHg MV Decel Time: 197 msec     TR Vmax:        267.00 cm/s MV E velocity: 111.00 cm/s MV A velocity: 108.00 cm/s  SHUNTS MV E/A ratio:  1.03         Systemic VTI:  0.23 m                             Systemic Diam: 2.00 cm Ida Rogue MD Electronically signed by Ida Rogue MD Signature Date/Time: 11/30/2022/1:03:00 PM    Final      Assessment and plan- Patient is a 76 y.o. 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  Patient on maintenance Herceptin and Perjeta with last dose given in October 2023.  She is here to discuss echocardiogram findings and further management  Patient noted to have an EF of 50 to 55% with a global longitudinal strain of 16.2% in October 2023.  She was then noted to have  an EF of 40 to 45% with a global longitudinal strain of -13.3% in November 2023 and Herceptin and Burley Saver was held since then.  Repeat echocardiogram on 11/30/2022 shows an EF of 35 to 40% again with the global longitudinal strain of -13.6%.  Given the drop in her ejection fraction I am not giving her Herceptin and Perjeta at this time.  I am referring her to cardiology for further management to optimize her cardiac status.  Clinically patient is not in overt heart failure.  Is going to be challenging to poor for antibiotics treatments with her low EF but probably switching her to lapatinib would be our best option given the low risk of cardiotoxicity associated with the drug but again I will wait for cardiology opinion before starting the drug.  Also patient reports worsening back pain and therefore I am obtaining repeat CT chest abdomen pelvis as well as a bone scan to see if she has progressed Herceptin Perjeta.  I will see her back after cardiology evaluation and scans   Visit Diagnosis 1. Metastasis to bone ([place])   2. Primary malignant neoplasm of breast with metastasis ([place])   3. Acute mid back pain      Dr. Randa Evens, MD, MPH [facility] [phone] 12/31/2022 6:34 PM

## 2022-12-07 NOTE — Patient Instructions (Addendum)
Integumentary System  Scalp condition-Seborrheic dermatitis:  Continue Ketoconazole 2% shampoo as directed: apply every other day massage into scalp and leave in for 5 minutes before rinsing out.  Continue Hydrocortisone 2.5% twice daily to ears and forehead as needed for itching.  Start Fluocinonide 0.05% solution once or twice daily to scalp as needed for itching. Avoid applying to face, groin, and axilla. Use as directed. Long-term use can cause thinning of the skin.  Do not use rubbing alcohol all over skin. Do not use peri-wash all over body.   Use Dove soap for sensitive skin to bathe. Lather on to damp skin, rinse off.  Can apply Eucerin moisturizing cream after bathing.   Topical steroids (such as triamcinolone, fluocinolone, fluocinonide, mometasone, clobetasol, halobetasol, betamethasone, hydrocortisone) can cause thinning and lightening of the skin if they are used for too long in the same area. Your physician has selected the right strength medicine for your problem and area affected on the body. Please use your medication only as directed by your physician to prevent side effects.    Seborrheic Dermatitis  What is seborrheic dermatitis? Seborrheic (say: seb-oh-ree-ick) dermatitis is a disease that causes flaking of the skin.  It usually affects the scalp.  In teenagers and adults, it is commonly called "dandruff".  In infants, it is referred to as "cradle cap".  Dandruff often appears as scaling on the scalp with or without redness.  On other parts of the body, seborrheic dermatitis tends to produce both redness and scaling.  Other common locations of seborrheic dermatitis include the central face, eyebrows, chest, and the creases of the arms, legs, and groin.  It often causes the skin to look a little greasy, scaly, or flaky. Seborrheic dermatitis can occur at any age.  It often comes and goes and may to be seasonally related, especially in the Northern climates.  What causes  seborrheic dermatitis? The exact cause is not known, though yeast of the Malassezia species may be involved.  This organism is normally present on the skin in small numbers, but sometimes its numbers increase, especially in oily skin.  Treatments that reduce the yeast tend to improve seborrheic dermatitis.  How is seborrheic dermatitis treated? The treatment of seborrheic dermatitis depends on its location on the body and the person's age. Seborrheic dermatitis of the scalp (dandruff) in adults and teenagers is usually treated with a medicated shampoo.  Here is a list of the medications that help, and the over-counter shampoos that contain them: Salicylic acid (Neutrogena T/Sal, Sebulex, Scalpicin, Denorex Extra Strength) Zinc pyrithione (Head & Shoulders white bottle, Denorex Daily, DHS Zinc, Pantene Pro-V Pyrithione Zinc) Selenium sulfide (Head & Shoulders blue bottle, Selsun Blue, Exsel Lotion Shampoo, Glo-Sel) Yahoo tar (Neutrogena T/Gal, Pentrax, Zetar, Tegrin, Viacom, Therapeutic Denorex) Ketoconazole (Nizoral)  If you have dandruff, you might start by using one of these shampoos every day until your dandruff is controlled and then keep using it at least twice a week.  Often times your doctor will recommend a rotation of several different medicated shampoos as some will experience a plateau in the effectiveness of any one shampoo.   When you use a dandruff shampoo, rub the shampoo into your wet hair and massage into scalp thoroughly.  Let it stay on your hair and scalp for 5 minutes before rinsing.  If you have involvement in the eyebrows or face, you can lather those areas with the medicated shampoo as well, or use a medicated soap (ZNP-bar, Polytar Soap, SAStid,  or sulfur soap).    If the wash or shampoo alone does not help, your doctor might want you to use a prescription medication once or twice a day.  Leave-in medications for the scalp are best applied by massaging into the scalp  immediately after towel drying your hair, but may be applied even if you have not washed your hair.  Seborrheic dermatitis in infants usually clears up by age 35 -44 months.  It may develop in the diaper area where it might be confused with diaper rash.  For milder cases you can try gently brushing out scales with a soft brush.  This is best done immediately after washing with a non-medicated baby shampoo Wynetta Emery and Royce Macadamia, etc.).  Your doctor may recommend a medicated shampoo or a prescription topical medication.     Gentle Skin Care Guide  1. Bathe no more than once a day.  2. Avoid bathing in hot water  3. Use a mild soap like Dove for Sensitive Skin, Vanicream, Cetaphil, CeraVe. Can use Lever 2000 or Cetaphil antibacterial soap  4. Use soap only where you need it. On most days, use it under your arms, between your legs, and on your feet. Let the water rinse other areas unless visibly dirty.  5. When you get out of the bath/shower, use a towel to gently blot your skin dry, don't rub it.  6. While your skin is still a little damp, apply a moisturizing cream such as Vanicream, CeraVe, Cetaphil, Eucerin, Sarna lotion or plain Vaseline Jelly. For hands apply Neutrogena [place] Hand Cream or Excipial Hand Cream.  7. Reapply moisturizer any time you start to itch or feel dry.  8. Sometimes using free and clear laundry detergents can be helpful. Fabric softener sheets should be avoided. Downy Free & Gentle liquid, or any liquid fabric softener that is free of dyes and perfumes, it acceptable to use  9. If your doctor has given you prescription creams you may apply moisturizers over them     Due to recent changes in healthcare laws, you may see results of your pathology and/or laboratory studies on MyChart before the doctors have had a chance to review them. We understand that in some cases there may be results that are confusing or concerning to you. Please understand that not all  results are received at the same time and often the doctors may need to interpret multiple results in order to provide you with the best plan of care or course of treatment. Therefore, we ask that you please give [place] 2 business days to thoroughly review all your results before contacting the office for clarification. Should we see a critical lab result, you will be contacted sooner.   If You Need Anything After Your Visit  If you have any questions or concerns for your doctor, please call our main line at [phone] and press option 4 to reach your doctor's medical assistant. If no one answers, please leave a voicemail as directed and we will return your call as soon as possible. Messages left after 4 pm will be answered the following business day.   You may also send [place] a message via [place]. We typically respond to MyChart messages within 1-2 business days.  For prescription refills, please ask your pharmacy to contact our office. Our fax number is [phone].  If you have an urgent issue when the clinic is closed that cannot wait until the next business day, you can page your doctor at the  number below.    Please note that while we do our best to be available for urgent issues outside of office hours, we are not available 24/7.   If you have an urgent issue and are unable to reach [place], you may choose to seek medical care at your doctor's office, retail clinic, urgent care center, or emergency room.  If you have a medical emergency, please immediately call 911 or go to the emergency department.  Pager Numbers  - Dr. Nehemiah Massed: [phone]  - Dr. Laurence Ferrari: [phone]  - Dr. Nicole Kindred: [phone]  In the event of inclement weather, please call our main line at [phone] for an update on the status of any delays or closures.  Dermatology Medication Tips: Please keep the boxes that topical medications come in in order to help keep track of the instructions about where and how to use  these. Pharmacies typically print the medication instructions only on the boxes and not directly on the medication tubes.   If your medication is too expensive, please contact our office at [phone] option 4 or send [place] a message through [place].   We are unable to tell what your co-pay for medications will be in advance as this is different depending on your insurance coverage. However, we may be able to find a substitute medication at lower cost or fill out paperwork to get insurance to cover a needed medication.   If a prior authorization is required to get your medication covered by your insurance company, please allow [place] 1-2 business days to complete this process.  Drug prices often vary depending on where the prescription is filled and some pharmacies may offer cheaper prices.  The website www.goodrx.com contains coupons for medications through different pharmacies. The prices here do not account for what the cost may be with help from insurance (it may be cheaper with your insurance), but the website can give you the price if you did not use any insurance.  - You can print the associated coupon and take it with your prescription to the pharmacy.  - You may also stop by our office during regular business hours and pick up a GoodRx coupon card.  - If you need your prescription sent electronically to a different pharmacy, notify our office through [facility] or by phone at [phone] option 4.     Si Usted Necesita Algo Despus de Su Visita  Tambin puede enviarnos un mensaje a travs de Pharmacist, community. Por lo general respondemos a los mensajes de MyChart en el transcurso de 1 a 2 das hbiles.  Para renovar recetas, por favor pida a su farmacia que se ponga en contacto con nuestra oficina. Harland Dingwall de fax es [place] [phone].  Si tiene un asunto urgente cuando la clnica est cerrada y que no puede esperar hasta el siguiente da hbil, puede llamar/localizar a su doctor(a) al  nmero que aparece a continuacin.   Por favor, tenga en cuenta que aunque hacemos todo lo posible para estar disponibles para asuntos urgentes fuera del horario de [place], no estamos disponibles las 24 horas del da, los 7 das de la [place].   Si tiene un problema urgente y no puede comunicarse con nosotros, puede optar por buscar atencin mdica  en el consultorio de su doctor(a), en una clnica privada, en un centro de atencin urgente o en una sala de emergencias.  Si tiene Engineering geologist, por favor llame inmediatamente al 911 o vaya a la sala de emergencias.  Nmeros de  bper  - Dr. Nehemiah Massed: [phone]  - Dra. Moye: [phone]  - Dra. Nicole Kindred: [phone]  En caso de inclemencias del [place], por favor llame a Johnsie Kindred principal al [phone] para una actualizacin sobre el [place] de cualquier retraso o cierre.  Consejos para la medicacin en dermatologa: Por favor, guarde las cajas en las que vienen los medicamentos de uso tpico para ayudarle a seguir las instrucciones sobre dnde y cmo usarlos. Las farmacias generalmente imprimen las instrucciones del medicamento slo en las cajas y no directamente en los tubos del [place].   Si su medicamento es muy caro, por favor, pngase en contacto con Zigmund Daniel llamando al [phone] y presione la opcin 4 o envenos un mensaje a travs de Pharmacist, community.   No podemos decirle cul ser su copago por los medicamentos por adelantado ya que esto es diferente dependiendo de la cobertura de su seguro. Sin embargo, es posible que podamos encontrar un medicamento sustituto a Electrical engineer un formulario para que el seguro cubra el medicamento que se considera necesario.   Si se requiere una autorizacin previa para que su compaa de seguros [place] su medicamento, por favor permtanos de 1 a 2 das hbiles para completar este proceso.  Los precios de los medicamentos varan con frecuencia dependiendo del Environmental consultant de  dnde se surte la receta y alguna farmacias pueden ofrecer precios ms baratos.  El sitio web www.goodrx.com tiene cupones para medicamentos de Airline pilot. Los precios aqu no tienen en cuenta lo que podra costar con la ayuda del seguro (puede ser ms barato con su seguro), pero el sitio web puede darle el precio si no utiliz Research scientist (physical sciences).  - Puede imprimir el cupn correspondiente y llevarlo con su receta a la farmacia.  - Tambin puede pasar por nuestra oficina durante el horario de atencin regular y Charity fundraiser una tarjeta de cupones de GoodRx.  - Si necesita que su receta se enve electrnicamente a una farmacia diferente, informe a nuestra oficina a travs de MyChart de [place] o por telfono llamando al [phone] y presione la opcin 4.

## 2022-12-07 NOTE — Progress Notes (Signed)
   Follow-Up Visit   Subjective  Laura Santiago is a 76 y.o. female who presents for the following: Follow-up (Recheck seborrheic dermatitis. Scalp, forehead, ears, face. Using Ketoconazole shampoo, HC 2.5% cream as directed. Patient reports some improvement. She states the topical spray for the leg ulcers helps this condition the most).    The following portions of the chart were reviewed this encounter and updated as appropriate:      Review of Systems: No other skin or systemic complaints except as noted in HPI or Assessment and Plan.   Objective  Well appearing patient in no apparent distress; mood and affect are within normal limits.  A focused examination was performed including head, including the scalp, face, neck, nose, ears, eyelids, and lips. Relevant physical exam findings are noted in the Assessment and Plan.  Scalp, face, ears Clear today on scalp. Shortened hairs frontal scalp.   Assessment & Plan  Seborrheic dermatitis Scalp, face, ears  Chronic and persistent condition with duration or expected duration over one year. Condition is symptomatic/ bothersome to patient. Not currently at goal.  Improving.  With decreased itch  Continue Ketoconazole 2% shampoo as directed: apply every other day massage into scalp and leave in for 5 minutes before rinsing out.  Continue Hydrocortisone 2.5% twice daily to ears and forehead as needed for itching.  Start Fluocinonide 0.05% solution once or twice daily to scalp as needed for itching. Avoid applying to face, groin, and axilla. Use as directed. Long-term use can cause thinning of the skin.  Topical steroids (such as triamcinolone, fluocinolone, fluocinonide, mometasone, clobetasol, halobetasol, betamethasone, hydrocortisone) can cause thinning and lightening of the skin if they are used for too long in the same area. Your physician has selected the right strength medicine for your problem and area affected on the body. Please  use your medication only as directed by your physician to prevent side effects.   Do not use rubbing alcohol all over skin. Do not use peri-wash all over body.   Use Dove soap for sensitive skin to bathe. Lather on to damp skin, rinse off.  Can apply Eucerin moisturizing cream after bathing.   fluocinonide (LIDEX) 0.05 % external solution - Scalp, face, ears Apply once or twice daily to scalp as needed for itching.  Avoid applying to face, groin, and axilla.  Related Medications ketoconazole (NIZORAL) 2 % shampoo apply every other day  massage into scalp and leave in for 5 minutes before rinsing out  hydrocortisone 2.5 % cream Apply 1 to 2 times daily for itchy rash at face, scalp, ears, and forehead.   Return in about 3 months (around 03/08/2023) for Seborrheic Dermatitis Follow Up.  I, Emelia Salisbury, CMA, am acting as scribe for Brendolyn Patty, MD.  Documentation: I have reviewed the above documentation for accuracy and completeness, and I agree with the above.  Brendolyn Patty MD

## 2022-12-07 NOTE — Telephone Encounter (Signed)
NP called patient's daughter Arbutus Ped to check-in how patient is doing.  She reported no acute distress; requested in person visit sometime in the new year.

## 2022-12-08 ENCOUNTER — Other Ambulatory Visit: Payer: Self-pay | Admitting: Oncology

## 2022-12-12 MED ORDER — OXYCODONE HCL 10 MG PO TABS: 15.0000 mg | ORAL_TABLET | Freq: Four times a day (QID) | ORAL | 0 refills | Status: DC | PRN

## 2022-12-12 MED ORDER — AMPHETAMINE-DEXTROAMPHET ER 30 MG PO CP24: 30.0000 mg | ORAL_CAPSULE | Freq: Every day | ORAL | 0 refills | Status: DC

## 2022-12-13 ENCOUNTER — Encounter: Payer: Medicare Other | Attending: Internal Medicine | Admitting: Internal Medicine

## 2022-12-13 DIAGNOSIS — I890 Lymphedema, not elsewhere classified: Secondary | ICD-10-CM | POA: Insufficient documentation

## 2022-12-13 DIAGNOSIS — N184 Chronic kidney disease, stage 4 (severe): Secondary | ICD-10-CM | POA: Diagnosis not present

## 2022-12-13 DIAGNOSIS — Z9221 Personal history of antineoplastic chemotherapy: Secondary | ICD-10-CM | POA: Insufficient documentation

## 2022-12-13 DIAGNOSIS — Z7901 Long term (current) use of anticoagulants: Secondary | ICD-10-CM | POA: Diagnosis not present

## 2022-12-13 DIAGNOSIS — I87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: Secondary | ICD-10-CM | POA: Diagnosis not present

## 2022-12-13 DIAGNOSIS — L03115 Cellulitis of right lower limb: Secondary | ICD-10-CM | POA: Insufficient documentation

## 2022-12-13 DIAGNOSIS — L97822 Non-pressure chronic ulcer of other part of left lower leg with fat layer exposed: Secondary | ICD-10-CM | POA: Diagnosis not present

## 2022-12-13 DIAGNOSIS — Z86718 Personal history of other venous thrombosis and embolism: Secondary | ICD-10-CM | POA: Diagnosis not present

## 2022-12-13 DIAGNOSIS — I87311 Chronic venous hypertension (idiopathic) with ulcer of right lower extremity: Secondary | ICD-10-CM | POA: Diagnosis not present

## 2022-12-13 DIAGNOSIS — Z853 Personal history of malignant neoplasm of breast: Secondary | ICD-10-CM | POA: Insufficient documentation

## 2022-12-13 DIAGNOSIS — I129 Hypertensive chronic kidney disease with stage 1 through stage 4 chronic kidney disease, or unspecified chronic kidney disease: Secondary | ICD-10-CM | POA: Diagnosis not present

## 2022-12-13 DIAGNOSIS — Z923 Personal history of irradiation: Secondary | ICD-10-CM | POA: Diagnosis not present

## 2022-12-15 NOTE — Progress Notes (Signed)
Laura Santiago, Laura Santiago ([id number]) [id number] Page 1 of 12 Visit Report for 12/13/2022 Chief Complaint Document Details Patient Name: Date of Service: Laura Ace. 12/13/2022 11:15 A M Medical Record Number: [id number] Patient Account Number: [id number] Date of Birth/Sex: Treating RN: Feb 26, 1946 (77 y.o. Laura Santiago Primary Care Provider: [place] Other Clinician: Referring Provider: Treating Provider/Extender: [name] Weeks in Treatment: 16 Information Obtained from: Patient Chief Complaint Left lower extremity wound Right toe wounds Left upper lateral thigh wounds Electronic Signature(s) Signed: 12/13/2022 1:46:18 PM By: Kalman Shan DO Entered By: Kalman Shan on 12/13/2022 12:12:08 -------------------------------------------------------------------------------- Debridement Details Patient Name: Date of Service: Laura Santiago Laura M. 12/13/2022 11:15 A M Medical Record Number: [id number] Patient Account Number: [id number] Date of Birth/Sex: Treating RN: [birth year] (77 y.o. Laura Santiago Primary Care Provider: [place] Other Clinician: Referring Provider: Treating Provider/Extender: [name] Weeks in Treatment: 45 Debridement Performed for Assessment: Wound #1 Left,Medial Lower Leg Performed By: Physician Kalman Shan, MD Debridement Type: Debridement Severity of Tissue Pre Debridement: Fat layer exposed Level of Consciousness (Pre-procedure): Awake and Alert Pre-procedure Verification/Time Out Yes - 11:11 Taken: Start Time: 11:11 T Area Debrided (L x W): otal 3 (cm) x 1.5 (cm) = 4.5 (cm) Tissue and other material debrided: Viable, Non-Viable, Subcutaneous Level: Skin/Subcutaneous Tissue Debridement Description: Excisional Instrument: Curette Bleeding: Minimum Hemostasis Achieved: Pressure End Time: 11:18 Procedural Pain: 0 Laura Santiago, Laura Santiago  ([id number]) [id number] Page 2 of 12 Post Procedural Pain: 0 Response to Treatment: Procedure was tolerated well Level of Consciousness (Post- Awake and Alert procedure): Post Debridement Measurements of Total Wound Length: (cm) 5 Width: (cm) 1.5 Depth: (cm) 0.2 Volume: (cm) 1.178 Character of Wound/Ulcer Post Debridement: Improved Severity of Tissue Post Debridement: Fat layer exposed Post Procedure Diagnosis Same as Pre-procedure Electronic Signature(s) Signed: 12/13/2022 1:46:18 PM By: Kalman Shan DO Signed: 12/14/2022 5:05:00 PM By: Carlene Coria RN Entered By: Carlene Coria on 12/13/2022 11:16:02 -------------------------------------------------------------------------------- HPI Details Patient Name: Date of Service: Laura Santiago Laura M. 12/13/2022 11:15 A M Medical Record Number: [id number] Patient Account Number: [id number] Date of Birth/Sex: Treating RN: Feb 24, 1946 (77 y.o. Laura Santiago Primary Care Provider: [place] Other Clinician: Referring Provider: Treating Provider/Extender: [name] Weeks in Treatment: 33 History of Present Illness HPI Description: Admission 7/27 [name] is a 77 year old female with a past medical history of ADHD, metastatic breast cancer, stage IV chronic kidney disease, history of DVT on Xarelto and chronic venous insufficiency that presents to the clinic for a chronic left lower extremity wound. She recently moved to [facility] 4 days ago. She was being followed by wound care center in [name]. She reports a 10-year history of wounds to her left lower extremity that eventually do heal with debridement and compression therapy. She states that the current wound reopened 4 months ago and she is using Vaseline and Coban. She denies signs of infection. 8/3; patient presents for 1 week follow-up. She reports no issues or complaints today. She states she had  vascular studies done in the last week. She denies signs of infection. She brought her little service dog with her today. 8/17; patient presents for follow-up. She has missed her last clinic appointment. She states she took the wrap off and attempted to rewrap her leg. She is having difficulty with transportation. She has her service dog with her today. Overall she feels well  and reports improvement in wound healing. She denies signs of infection. She reports owning an old Velcro wrap compression and has this at her living facility 9/14; patient presents for follow-up. Patient states that over the past 2 to 3 weeks she developed toe wounds to her right foot. She attributes this to tight fitting shoes. She subsequently developed cellulitis in the right leg and has been treated by doxycycline by her oncologist. She reports improvement in symptoms however continues to have some redness and swelling to this leg. T the left lower extremity patient has been having her wraps changed with home health twice weekly. She states that the [facility] is not helping control o the drainage. Other than that she has no issues or complaints today. She denies signs of infection to the left lower extremity. 9/21; patient presents for follow-up. She reports seeing infectious disease for her cellulitis. She reports no further management. She has home health that changes the wraps twice weekly. She has no issues or complaints today. She denies signs of infection. 10/5; patient presents for follow-up. She has no issues or complaints today. She denies signs of infection. She states that the right great toe has not been dressed by home health. 10/12; patient presents for follow-up. She has no issues or complaints today. She reports improvement in her wound healing. She has been using silver alginate to the right great toe wound. She denies signs of infection. 10/26; patient presents for follow-up. Home health did not have  sorbact so they continued to use Hydrofera Blue under the wrap. She has been using silver alginate to the great toe wound however she did not have a dressing in place today. She currently denies signs of infection. 11/2; patient presents for follow-up. She has been using sorb act under the compression wrap. She reports using silver alginate to the toe wound again she does ZYRIAH, MASK ([id number]) [id number] Page 3 of 12 not have a dressing in place. She currently denies signs of infection. 11/23; patient presents for follow-up. Unfortunately she has missed her last 2 clinic appointments. She was last seen 3 weeks ago. She did her own compression wrap with Kerlix and Coban yesterday after seeing vein and vascular. She has not been dressing her right great toe wound. She currently denies signs of infection. 11/30; patient presents for 1 week follow-up. She states she changed her dressing last week prior to home health and use sorb act with Dakin's and Hydrofera Blue. Home health has changed the dressing as well and they have been using sorbact. T oday she reports increased redness to her right lower extremity. She has a history of cellulitis to this leg. She has been using silver alginate to the right great toe. Unfortunately she had an episode of diarrhea prior to coming in and had feces all over the right leg and to the wrap of her left leg. 12/7; patient presents for 1 week follow-up. She states that home health did not come out to change the dressing and she took it off yesterday. It is unclear if she is dressing the right toe wound. She denies signs of infection. 12/14; patient presents for 1 week follow-up. She has no issues or complaints today. 12/21; patient presents for follow-up. She has no issues or complaints today. She denies signs of infection. 12/28/2021; patient presents for follow-up. She was hospitalized for sepsis secondary to right lower extremity  cellulitis On 12/23. She states she is currently at a SNF. She states that she  was started on doxycycline this morning for her right great toe swelling and redness. She is not sure what dressings have been done to her left lower extremity for the past 3 weeks. She says its been mainly gauze with an Ace wrap. 1/25; patient presents for follow-up. She is still residing in a skilled nursing facility. She reports mild pain to the left lower extremity wound bed. She states she is going to see a podiatrist soon. 2/8; patient presents for follow-up. She has moved back to her residential community from her skilled nursing facility. She has no issues or complaints today. She denies signs of systemic infections. 2/15; patient presents for follow-up. He has no issues or complaints today. She denies systemic signs of infection. 2/22; patient presents for follow-up. She has no issues or complaints today. She denies signs of infection. 3/1; patient presents for follow-up. She states that home health came out the day after she was seen in our clinic and yesterday to do the wrap change. She denies signs of infection. She reports excoriated skin on the ankle. 3/8; patient presents for follow-up. She has no issues or complaints today. She denies signs of infection. 3/15; patient presents for follow-up. Home health has been coming out to change the dressings. She reports more tenderness to the wound site. She denies purulent drainage, increased warmth or erythema to the area. 4/5; patient presents for follow-up. She has missed her last 2 clinic appointments. I have not seen her in 3 weeks. She was recently hospitalized for altered mental status. She was involuntarily committed. She was evaluated by psychiatry and deemed to have competency. There was no specific cause of her altered mental status. It was concluded that her physical and mental health were declining due to her chronic medical conditions. Currently home  health has been coming out for dressing changes. Patient has also been doing her own dressing changes. She reports more skin breakdown to the periwound and now has a new wound. She denies fever/chills. She reports continued tenderness to the wound site. 4/12; patient with significant venous insufficiency and a large wound on her left lower leg taking up about 80% of the circumference of her lower leg. Cultures of this grew MRSA and Pseudomonas. She had completed a course of ciprofloxacin now is starting doxycycline. She has been using Dakin's wet-to-dry and a Tubigrip. She has home health twice a week and we change it once. 4/19; patient presents for follow-up. She completed her course of doxycycline. She has been using Dakin's wet-to-dry dressing and Tubigrip. Home health changes the dressing twice weekly. Currently she has no issues or complaints. 4/26; patient presents for follow-up. At last clinic visit orders for home health were Iodosorb under compression therapy. Unfortunately they did not have the dressing and have been using Dakin's and gentamicin under the wrap. Patient currently denies signs of infection. She has no issues or complaints today. 5/3; patient presents for follow-up. Again Iodosorb has not been used under the compression therapy when home health comes out to change the wrap and dressing. They have been using Sorbact. It is unclear why this is happening since we send orders weekly to the agency. She denies signs of infection. Patient has not purchased the [place] antibiotics. We reached out to the company and they said they have been trying to contact her on a regular basis. We gave the patient the number to call to order the medication. 5/10; patient presents for follow-up. She has no issues or complaints today. Again  home health has not been using Iodosorb. Mepilex was on the wound bed. No other dressings noted. She brought in her Keystone antibiotics. She denies signs of  infection. 5/17; patient presents for follow-up. Home health has come out twice since she was last seen. Joint well she has been using Keystone antibiotic with Sorbact under the compression wrap. She has no issues or complaints today. She denies signs of infection. 5/24; patient presents for follow-up. We have been using Keystone antibiotics with Sorbact under compression therapy. She is tolerating the treatment well. She is reporting improvement in wound healing. She denies signs of infection. 5/31; patient presents for follow-up. We continue to do [facility] antibiotics with Sorbact under compression therapy. She continues to report improvement in wound healing. Home health comes out and changes the dressing once weekly. 05-17-2022 upon evaluation today patient appears to be doing better in regard to her wound especially compared to the last time I saw her. Fortunately I do think that she is seeing improvements. With that being said I do believe that she may be benefit from sharp debridement today to clear away some of the necrotic debris I discussed that with her as well. She is an amendable to that plan. Otherwise she is very pleased with how the [employer] is doing for her. 6/14; patient presents for follow-up. We have been using Keystone antibiotic with Sorbact and absorbent dressings under 3 layer compression. She has no issues or complaints today. She reports improvement in wound healing. She denies signs of infection. 6/21; patient presents for follow-up. We are continuing with [facility] antibiotic and Sorbact under 3 layer compression. Patient has no complaints. Continued wound healing is happening. She denies signs of infection. 6/28; patient presents for follow-up. We have been using Keystone antibiotic with Sorbact under 3 layer compression. Usually home health comes out and changes the dressing twice a week. Unfortunately they did not go out to change the dressing. It is unclear why. Patient  did not call them. She currently denies signs of infection. 7/5; patient presents for follow-up. We have been using Keystone antibiotic with calcium alginate under 3 layer compression. She reports improvement in wound healing. She denies signs of infection. Home health has come out to do dressing changes twice this past week. 7/12; patient presents for follow-up. We have been using Keystone antibiotic with calcium alginate under 3 layer compression. Patient states that home health came out once last week to change the dressing. She reports improvement in wound healing. She currently denies signs of infection. Laura Santiago, Laura Santiago ([id number]) [id number] Page 4 of 12 7/19; patient presents for follow-up. We have been using Keystone antibiotic with calcium alginate under 3 layer compression. Home health came out once last week to change the dressing. She has no issues or complaints today. She denies signs of infection. 8/2; patient presents for follow-up. We have been using Keystone antibiotic with calcium alginate under 3 layer compression. Unfortunately she missed her appointment last week and home health did not come out to do dressing changes. Patient currently denies signs of infection. 8/9; patient presents for follow-up. We have been using Keystone with calcium alginate under 3 layer compression. She states that home health came out once last week. She currently denies signs of infection. Her wrap was completely wet. She states she was cleaning the top of the leg and water soaked down into the wrap. 8/16; patient presents for follow-up. We have been using Keystone with calcium alginate under 3 layer compression. She states that  home health came out twice last week. She has no issues or complaints today. 8/23; patient presents for follow-up. He has been using Keystone with calcium alginate under 3 layer compression. Home health came out twice last week. She denies signs  of infection. 8/30; patient presents for follow-up. We have been using Keystone with calcium alginate under 3 layer compression. Home health came out once last week to change the dressing. Patient reports improvement in wound healing. She states she is almost done with her chemotherapy infusions and has 1 more left. 9/13; patient presents for follow-up. She has lost the capsules to her [facility] antibiotic which I believe is the vancomycin pills. She has her Zosyn powder today. We have been using Keystone antibiotic ointment with calcium alginate under 3 layer compression. She is concerned about systemic infection however her vitals are stable and there is no surrounding soft tissue infection. She would like to remain a patient in our wound care center however would like a second opinion for her wound care at another facility. She asked to be referred to [facility] wound care center. 9/20; patient presents for follow-up. She found her vancomycin capsules and brought in her complete Keystone antibiotic ointment set today. Unfortunately she has developed skin breakdown and Erythema to the right lower extremity With increased swelling. She states she went to a pow wow Over the weekend and was on her feet for extended periods of time. She saw her oncologist yesterday who prescribed her doxycycline for her right lower extremity erythema. 9/27; patient presents for follow-up. We have been using Keystone antibiotic with Aquacel under 3 layer compression to the lower extremities bilaterally. When home health came and changed the wrap she secretly put coffee into the spray mix along with [facility] antibiotic on her leg thinking the acidic component would better activate the zoysn (sonething she discussed with her microbiologist brother). She has reported improvement in wound healing. 10/4; patient presents for follow-up. She has no issues or complaints today. We have been doing Aquacel and keystone under 3 layer  compression to the lower extremities bilaterally. This morning she took the right lower extremity wrap off as it was uncomfortable. She has no open wounds to this leg. 10/11; patient presents for follow-up. We have been doing Aquacel with Keystone antibiotic ointment under 3 layer compression to the left lower extremity. She developed a small blister to the anterior aspect of the left leg noticed when the wrap was taken off on intake. She currently denies signs of infection. 10/18; patient presents for follow-up. We have been doing Aquacel with Keystone antibiotic ointment under 3 layer compression to the left lower extremity. There has been continued improvement in wound healing. She denies signs of infection. 10/25; patient arrives for treatment of venous insufficiency ulcers on her left lower leg both lateral and medial are remanence of apparently a circumferential wound. Much improved. We are using topicals Keystone and Aquacel Ag under 3 layer compression we continue to make good progress. The patient talk to me at some length with regards to different things she has on her forehead and her Peri orbital area for which she is apparently applying [place]. She feels that what ever we are treating on her wounds is a more systemic problem. I really was not able to get a handle on what she is talking about however I did caution her not to put the V[place] in her eyes. 11/1; her wounds continue to improve she is using Keystone and Aquacel Ag G under  3 layer compression. Our intake nurse notes erythema and edema in the right leg. The patient has a litany of concerns with regards to a rash on her forehead or ears and other systemic complaints. She has an appointment with dermatology on November 11 11/8; patient presents for follow-up. We have been using Keystone and Aquacel under 4-layer compression. She has no issues or complaints today. She reports improvement in wound healing. 11/15; patient presents  for follow-up. We have been using Aquacel with Keystone antibiotic under 3 layer compression. Patient continues improvement in wound healing. 12/6; patient presents for follow-up. We have been using Aquacel with Keystone antibiotic ointment under 3 layer compression. Wounds appear well-healing. 12/13; patient presents for follow-up. We have been using Aquacel with Keystone antibiotic under 3 layer compression. She has no issues or complaints today. 12/27 left lateral medial ankle. Superficial wounds remain there is significantly improved we are using Keystone backed with Zetuvit under 4-layer compression 1/3; patient presents for follow-up. Her wounds appear well-healing. We have been using Aquacel Ag with Keystone antibiotic ointment under compression therapy. This should be a 3 layer compression. Electronic Signature(s) Signed: 12/13/2022 1:46:18 PM By: Kalman Shan DO Entered By: Kalman Shan on 12/13/2022 12:14:12 Physical Exam Details -------------------------------------------------------------------------------- Laura Santiago ([id number]) [id number] Page 5 of 12 Patient Name: Date of Service: Laura Ace. 12/13/2022 11:15 A M Medical Record Number: [id number] Patient Account Number: [id number] Date of Birth/Sex: Treating RN: 1946/06/30 (77 y.o. Laura Santiago Primary Care Provider: [place] Other Clinician: Referring Provider: Treating Provider/Extender: [name] Weeks in Treatment: 35 Constitutional . Cardiovascular . Psychiatric . Notes 3 open wounds to the left lower extremity located to the lateral and medial aspects with granulation tissue and nonviable tissue. Evidence of lymphedema. No signs of surrounding infection. Electronic Signature(s) Signed: 12/13/2022 1:46:18 PM By: Kalman Shan DO Entered By: Kalman Shan on 12/13/2022  12:27:58 -------------------------------------------------------------------------------- Physician Orders Details Patient Name: Date of Service: Laura Santiago Laura M. 12/13/2022 11:15 A M Medical Record Number: [id number] Patient Account Number: [id number] Date of Birth/Sex: Treating RN: [birth year] (77 y.o. Laura Santiago Primary Care Provider: [place] Other Clinician: Referring Provider: Treating Provider/Extender: [name] Weeks in Treatment: 100 Verbal / Phone Orders: No Diagnosis Coding Follow-up Appointments Return Appointment in 1 week. Nurse Visit as needed [place]: Rocky Morel [phone] [place] for wound care. May utilize formulary equivalent dressing for wound treatment orders unless otherwise specified. Home Health Nurse may visit PRN to address patients wound care needs. - home health to see patient 2 times per week, patient to be seen at wound clinic once per week **Please direct any NON-WOUND related issues/requests for orders to patient's Primary Care Physician. **If current dressing causes regression in wound condition, may D/C ordered dressing product/s and apply Normal Saline Moist Dressing daily until next [place] or Other MD appointment. **Notify Wound Healing Center of regression in wound condition at [phone]. Other Home Health Orders/Instructions: Bathing/ Shower/ Hygiene May shower with wound dressing protected with water repellent cover or cast protector. No tub bath. Anesthetic (Use 'Patient Medications' Section for Anesthetic Order Entry) Lidocaine applied to wound bed Edema Control - Lymphedema / Segmental Compressive Device / Other Optional: One layer of unna paste to top of compression wrap (to act as an anchor). Elevate, Exercise Daily and A void Standing for Long Periods of Time. Elevate legs to the  level of the heart and pump ankles as often as  possible Elevate leg(s) parallel to the floor when sitting. DO YOUR BEST to sleep in the bed at night. DO NOT sleep in your recliner. Long hours of sitting in a recliner leads to swelling of the legs Laura Santiago, Laura Santiago ([id number]) [id number] Page 6 of 12 and/or potential wounds on your backside. Additional Orders / Instructions Follow Nutritious Diet and Increase Protein Intake Medications-Please add to medication list. Keystone Compound Wound Treatment Wound #1 - Lower Leg Wound Laterality: Left, Medial Cleanser: Wound Cleanser 3 x Per Week/30 Days Discharge Instructions: Wash your hands with soap and water. Remove old dressing, discard into plastic bag and place into trash. Cleanse the wound with Wound Cleanser prior to applying a clean dressing using gauze sponges, not tissues or cotton balls. Do not scrub or use excessive force. Pat dry using gauze sponges, not tissue or cotton balls. Topical: Triamcinolone Acetonide Cream, 0.1%, 15 (g) tube 3 x Per Week/30 Days Discharge Instructions: lower leg Prim Dressing: AquacelAg Advantage Dressing, 2X2 (in/in) 3 x Per Week/30 Days ary Discharge Instructions: Apply to wound as directed Secondary Dressing: Zetuvit Plus 4x8 (in/in) 3 x Per Week/30 Days Compression Wrap: 3-LAYER WRAP - Profore Lite LF 3 Multilayer Compression Bandaging System 3 x Per Week/30 Days Discharge Instructions: Apply 3 multi-layer wrap as prescribed. Wound #8 - Lower Leg Wound Laterality: Left, Lateral Cleanser: Wound Cleanser 3 x Per Week/30 Days Discharge Instructions: Wash your hands with soap and water. Remove old dressing, discard into plastic bag and place into trash. Cleanse the wound with Wound Cleanser prior to applying a clean dressing using gauze sponges, not tissues or cotton balls. Do not scrub or use excessive force. Pat dry using gauze sponges, not tissue or cotton balls. Topical: Triamcinolone Acetonide Cream, 0.1%, 15 (g) tube  3 x Per Week/30 Days Discharge Instructions: Mix 1:1 with Nystatin cream and apply to periwound and leg Prim Dressing: AquacelAg Advantage Dressing, 2X2 (in/in) 3 x Per Week/30 Days ary Discharge Instructions: Apply to wound as directed Secondary Dressing: Zetuvit Plus 4x8 (in/in) 3 x Per Week/30 Days Compression Wrap: 3-LAYER WRAP - Profore Lite LF 3 Multilayer Compression Bandaging System 3 x Per Week/30 Days Discharge Instructions: Apply 3 multi-layer wrap as prescribed. Electronic Signature(s) Signed: 12/13/2022 1:46:18 PM By: Kalman Shan DO Entered By: Kalman Shan on 12/13/2022 12:29:17 -------------------------------------------------------------------------------- Problem List Details Patient Name: Date of Service: Laura Santiago Laura M. 12/13/2022 11:15 A M Medical Record Number: [id number] Patient Account Number: [id number] Date of Birth/Sex: Treating RN: 1946/10/17 (77 y.o. Laura Santiago Primary Care Provider: [place] Other Clinician: Referring Provider: Treating Provider/Extender: [name] Weeks in Treatment: [address] 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 11/02/2021 No Yes I87.311 Chronic venous hypertension (idiopathic) with ulcer of right lower extremity 08/30/2022 No Yes I87.2 Venous insufficiency (chronic) (peripheral) 07/06/2021 No Yes Z79.01 Long term (current) use of anticoagulants 07/06/2021 No Yes I10 Essential (primary) hypertension 07/06/2021 No Yes 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 07/06/2021 No Yes Inactive Problems ICD-10 Code Description Active Date Inactive Date S81.802A Unspecified  open wound, left lower leg, initial encounter 07/06/2021 07/06/2021 S91.101A Unspecified open wound of right great toe without damage to nail, initial  encounter 08/24/2021 08/24/2021 S91.104A Unspecified open wound of right lesser toe(s) without damage to nail, initial encounter 08/24/2021 08/24/2021 Resolved Problems ICD-10 Code Description Active Date Resolved Date S91.104D Unspecified open wound of right lesser toe(s) without damage to nail, subsequent 08/31/2021 08/31/2021 encounter S91.201D Unspecified open wound of right great toe with damage to nail, subsequent encounter 08/31/2021 08/31/2021 Electronic Signature(s) Signed: 12/13/2022 1:46:18 PM By: Kalman Shan DO Entered By: Kalman Shan on 12/13/2022 12:11:55 Laura Santiago ([id number]) [id number] Page 8 of 12 -------------------------------------------------------------------------------- Progress Note Details Patient Name: Date of Service: Laura Ace. 12/13/2022 11:15 A M Medical Record Number: [id number] Patient Account Number: [id number] Date of Birth/Sex: Treating RN: 27-Oct-1946 (77 y.o. Laura Santiago Primary Care Provider: Brooks Other Clinician: Referring Provider: Treating Provider/Extender: [name] Weeks in Treatment: 77 Subjective Chief Complaint Information obtained from Patient Left lower extremity wound Right toe wounds Left upper lateral thigh wounds History of Present Illness (HPI) Admission 7/27 Ms. Laura Santiago is a 77 year old female with a past medical history of ADHD, metastatic breast cancer, stage IV chronic kidney disease, history of DVT on Xarelto and chronic venous insufficiency that presents to the clinic for a chronic left lower extremity wound. She recently moved to [facility] 4 days ago. She was being followed by wound care center in [name]. She reports a 10-year history of wounds to her left lower  extremity that eventually do heal with debridement and compression therapy. She states that the current wound reopened 4 months ago and she is using Vaseline and Coban. She denies signs of infection. 8/3; patient presents for 1 week follow-up. She reports no issues or complaints today. She states she had vascular studies done in the last week. She denies signs of infection. She brought her little service dog with her today. 8/17; patient presents for follow-up. She has missed her last clinic appointment. She states she took the wrap off and attempted to rewrap her leg. She is having difficulty with transportation. She has her service dog with her today. Overall she feels well and reports improvement in wound healing. She denies signs of infection. She reports owning an old Velcro wrap compression and has this at her living facility 9/14; patient presents for follow-up. Patient states that over the past 2 to 3 weeks she developed toe wounds to her right foot. She attributes this to tight fitting shoes. She subsequently developed cellulitis in the right leg and has been treated by doxycycline by her oncologist. She reports improvement in symptoms however continues to have some redness and swelling to this leg. T the left lower extremity patient has been having her wraps changed with home health twice weekly. She states that the [facility] is not helping control o the drainage. Other than that she has no issues or complaints today. She denies signs of infection to the left lower extremity. 9/21; patient presents for follow-up. She reports seeing infectious disease for her cellulitis. She reports no further management. She has home health that changes the wraps twice weekly. She has no issues or complaints today. She denies signs of infection. 10/5; patient presents for follow-up. She has no issues or complaints today. She denies signs of infection. She states that the right great toe has not  been dressed by home health. 10/12; patient presents for follow-up. She has no issues or complaints today. She reports improvement in her wound healing. She has been using silver alginate to the right  great toe wound. She denies signs of infection. 10/26; patient presents for follow-up. Home health did not have sorbact so they continued to use Hydrofera Blue under the wrap. She has been using silver alginate to the great toe wound however she did not have a dressing in place today. She currently denies signs of infection. 11/2; patient presents for follow-up. She has been using sorb act under the compression wrap. She reports using silver alginate to the toe wound again she does not have a dressing in place. She currently denies signs of infection. 11/23; patient presents for follow-up. Unfortunately she has missed her last 2 clinic appointments. She was last seen 3 weeks ago. She did her own compression wrap with Kerlix and Coban yesterday after seeing vein and vascular. She has not been dressing her right great toe wound. She currently denies signs of infection. 11/30; patient presents for 1 week follow-up. She states she changed her dressing last week prior to home health and use sorb act with Dakin's and Hydrofera Blue. Home health has changed the dressing as well and they have been using sorbact. T oday she reports increased redness to her right lower extremity. She has a history of cellulitis to this leg. She has been using silver alginate to the right great toe. Unfortunately she had an episode of diarrhea prior to coming in and had feces all over the right leg and to the wrap of her left leg. 12/7; patient presents for 1 week follow-up. She states that home health did not come out to change the dressing and she took it off yesterday. It is unclear if she is dressing the right toe wound. She denies signs of infection. 12/14; patient presents for 1 week follow-up. She has no issues or  complaints today. 12/21; patient presents for follow-up. She has no issues or complaints today. She denies signs of infection. 12/28/2021; patient presents for follow-up. She was hospitalized for sepsis secondary to right lower extremity cellulitis On 12/23. She states she is currently at a SNF. She states that she was started on doxycycline this morning for her right great toe swelling and redness. She is not sure what dressings have been done to her left lower extremity for the past 3 weeks. She says its been mainly gauze with an Ace wrap. 1/25; patient presents for follow-up. She is still residing in a skilled nursing facility. She reports mild pain to the left lower extremity wound bed. She states she is going to see a podiatrist soon. 2/8; patient presents for follow-up. She has moved back to her residential community from her skilled nursing facility. She has no issues or complaints today. She denies signs of systemic infections. 2/15; patient presents for follow-up. He has no issues or complaints today. She denies systemic signs of infection. 2/22; patient presents for follow-up. She has no issues or complaints today. She denies signs of infection. 3/1; patient presents for follow-up. She states that home health came out the day after she was seen in our clinic and yesterday to do the wrap change. She denies signs of infection. She reports excoriated skin on the ankle. 3/8; patient presents for follow-up. She has no issues or complaints today. She denies signs of infection. 3/15; patient presents for follow-up. Home health has been coming out to change the dressings. She reports more tenderness to the wound site. She denies purulent drainage, increased warmth or erythema to the area. Laura Santiago, Laura Santiago ([id number]) [id number] Page 9 of 12 4/5; patient presents for  follow-up. She has missed her last 2 clinic appointments. I have not seen her in 3 weeks. She was  recently hospitalized for altered mental status. She was involuntarily committed. She was evaluated by psychiatry and deemed to have competency. There was no specific cause of her altered mental status. It was concluded that her physical and mental health were declining due to her chronic medical conditions. Currently home health has been coming out for dressing changes. Patient has also been doing her own dressing changes. She reports more skin breakdown to the periwound and now has a new wound. She denies fever/chills. She reports continued tenderness to the wound site. 4/12; patient with significant venous insufficiency and a large wound on her left lower leg taking up about 80% of the circumference of her lower leg. Cultures of this grew MRSA and Pseudomonas. She had completed a course of ciprofloxacin now is starting doxycycline. She has been using Dakin's wet-to-dry and a Tubigrip. She has home health twice a week and we change it once. 4/19; patient presents for follow-up. She completed her course of doxycycline. She has been using Dakin's wet-to-dry dressing and Tubigrip. Home health changes the dressing twice weekly. Currently she has no issues or complaints. 4/26; patient presents for follow-up. At last clinic visit orders for home health were Iodosorb under compression therapy. Unfortunately they did not have the dressing and have been using Dakin's and gentamicin under the wrap. Patient currently denies signs of infection. She has no issues or complaints today. 5/3; patient presents for follow-up. Again Iodosorb has not been used under the compression therapy when home health comes out to change the wrap and dressing. They have been using Sorbact. It is unclear why this is happening since we send orders weekly to the agency. She denies signs of infection. Patient has not purchased the [place] antibiotics. We reached out to the company and they said they have been trying to contact her on a  regular basis. We gave the patient the number to call to order the medication. 5/10; patient presents for follow-up. She has no issues or complaints today. Again home health has not been using Iodosorb. Mepilex was on the wound bed. No other dressings noted. She brought in her Keystone antibiotics. She denies signs of infection. 5/17; patient presents for follow-up. Home health has come out twice since she was last seen. Joint well she has been using Keystone antibiotic with Sorbact under the compression wrap. She has no issues or complaints today. She denies signs of infection. 5/24; patient presents for follow-up. We have been using Keystone antibiotics with Sorbact under compression therapy. She is tolerating the treatment well. She is reporting improvement in wound healing. She denies signs of infection. 5/31; patient presents for follow-up. We continue to do [facility] antibiotics with Sorbact under compression therapy. She continues to report improvement in wound healing. Home health comes out and changes the dressing once weekly. 05-17-2022 upon evaluation today patient appears to be doing better in regard to her wound especially compared to the last time I saw her. Fortunately I do think that she is seeing improvements. With that being said I do believe that she may be benefit from sharp debridement today to clear away some of the necrotic debris I discussed that with her as well. She is an amendable to that plan. Otherwise she is very pleased with how the [employer] is doing for her. 6/14; patient presents for follow-up. We have been using Keystone antibiotic with Sorbact and absorbent dressings  under 3 layer compression. She has no issues or complaints today. She reports improvement in wound healing. She denies signs of infection. 6/21; patient presents for follow-up. We are continuing with [facility] antibiotic and Sorbact under 3 layer compression. Patient has no complaints. Continued wound  healing is happening. She denies signs of infection. 6/28; patient presents for follow-up. We have been using Keystone antibiotic with Sorbact under 3 layer compression. Usually home health comes out and changes the dressing twice a week. Unfortunately they did not go out to change the dressing. It is unclear why. Patient did not call them. She currently denies signs of infection. 7/5; patient presents for follow-up. We have been using Keystone antibiotic with calcium alginate under 3 layer compression. She reports improvement in wound healing. She denies signs of infection. Home health has come out to do dressing changes twice this past week. 7/12; patient presents for follow-up. We have been using Keystone antibiotic with calcium alginate under 3 layer compression. Patient states that home health came out once last week to change the dressing. She reports improvement in wound healing. She currently denies signs of infection. 7/19; patient presents for follow-up. We have been using Keystone antibiotic with calcium alginate under 3 layer compression. Home health came out once last week to change the dressing. She has no issues or complaints today. She denies signs of infection. 8/2; patient presents for follow-up. We have been using Keystone antibiotic with calcium alginate under 3 layer compression. Unfortunately she missed her appointment last week and home health did not come out to do dressing changes. Patient currently denies signs of infection. 8/9; patient presents for follow-up. We have been using Keystone with calcium alginate under 3 layer compression. She states that home health came out once last week. She currently denies signs of infection. Her wrap was completely wet. She states she was cleaning the top of the leg and water soaked down into the wrap. 8/16; patient presents for follow-up. We have been using Keystone with calcium alginate under 3 layer compression. She states that home  health came out twice last week. She has no issues or complaints today. 8/23; patient presents for follow-up. He has been using Keystone with calcium alginate under 3 layer compression. Home health came out twice last week. She denies signs of infection. 8/30; patient presents for follow-up. We have been using Keystone with calcium alginate under 3 layer compression. Home health came out once last week to change the dressing. Patient reports improvement in wound healing. She states she is almost done with her chemotherapy infusions and has 1 more left. 9/13; patient presents for follow-up. She has lost the capsules to her [facility] antibiotic which I believe is the vancomycin pills. She has her Zosyn powder today. We have been using Keystone antibiotic ointment with calcium alginate under 3 layer compression. She is concerned about systemic infection however her vitals are stable and there is no surrounding soft tissue infection. She would like to remain a patient in our wound care center however would like a second opinion for her wound care at another facility. She asked to be referred to [facility] wound care center. 9/20; patient presents for follow-up. She found her vancomycin capsules and brought in her complete Keystone antibiotic ointment set today. Unfortunately she has developed skin breakdown and Erythema to the right lower extremity With increased swelling. She states she went to a pow wow Over the weekend and was on her feet for extended periods of time. She saw her  oncologist yesterday who prescribed her doxycycline for her right lower extremity erythema. 9/27; patient presents for follow-up. We have been using Keystone antibiotic with Aquacel under 3 layer compression to the lower extremities bilaterally. When home health came and changed the wrap she secretly put coffee into the spray mix along with [facility] antibiotic on her leg thinking the acidic component would better activate the zoysn  (sonething she discussed with her microbiologist brother). She has reported improvement in wound healing. 10/4; patient presents for follow-up. She has no issues or complaints today. We have been doing Aquacel and keystone under 3 layer compression to the lower extremities bilaterally. This morning she took the right lower extremity wrap off as it was uncomfortable. She has no open wounds to this leg. 10/11; patient presents for follow-up. We have been doing Aquacel with Keystone antibiotic ointment under 3 layer compression to the left lower extremity. She developed a small blister to the anterior aspect of the left leg noticed when the wrap was taken off on intake. She currently denies signs of infection. Laura Santiago, Laura Santiago ([id number]) [id number] Page 10 of 12 10/18; patient presents for follow-up. We have been doing Aquacel with Keystone antibiotic ointment under 3 layer compression to the left lower extremity. There has been continued improvement in wound healing. She denies signs of infection. 10/25; patient arrives for treatment of venous insufficiency ulcers on her left lower leg both lateral and medial are remanence of apparently a circumferential wound. Much improved. We are using topicals Keystone and Aquacel Ag under 3 layer compression we continue to make good progress. The patient talk to me at some length with regards to different things she has on her forehead and her Peri orbital area for which she is apparently applying Lyles. She feels that what ever we are treating on her wounds is a more systemic problem. I really was not able to get a handle on what she is talking about however I did caution her not to put the [place] in her eyes. 11/1; her wounds continue to improve she is using Keystone and Aquacel Ag G under 3 layer compression. Our intake nurse notes erythema and edema in the right leg. The patient has a litany of concerns with regards to a rash  on her forehead or ears and other systemic complaints. She has an appointment with dermatology on November 11 11/8; patient presents for follow-up. We have been using Keystone and Aquacel under 4-layer compression. She has no issues or complaints today. She reports improvement in wound healing. 11/15; patient presents for follow-up. We have been using Aquacel with Keystone antibiotic under 3 layer compression. Patient continues improvement in wound healing. 12/6; patient presents for follow-up. We have been using Aquacel with Keystone antibiotic ointment under 3 layer compression. Wounds appear well-healing. 12/13; patient presents for follow-up. We have been using Aquacel with Keystone antibiotic under 3 layer compression. She has no issues or complaints today. 12/27 left lateral medial ankle. Superficial wounds remain there is significantly improved we are using Keystone backed with Zetuvit under 4-layer compression 1/3; patient presents for follow-up. Her wounds appear well-healing. We have been using Aquacel Ag with Keystone antibiotic ointment under compression therapy. This should be a 3 layer compression. Objective Constitutional Vitals Time Taken: 10:51 AM, Height: 66 in, Weight: 153 lbs, BMI: 24.7, Temperature: 97.7 F, Pulse: 79 bpm, Respiratory Rate: 16 breaths/min, Blood Pressure: 135/73 mmHg. General Notes: 3 open wounds to the left lower extremity located to the lateral and medial aspects with  granulation tissue and nonviable tissue. Evidence of lymphedema. No signs of surrounding infection. Integumentary (Hair, Skin) Wound #1 status is Open. Original cause of wound was Gradually Appeared. The date acquired was: 04/06/2021. The wound has been in treatment 75 weeks. The wound is located on the Left,Medial Lower Leg. The wound measures 5cm length x 1.5cm width x 0.1cm depth; 5.89cm^2 area and 0.589cm^3 volume. There is Fat Layer (Subcutaneous Tissue) exposed. There is no tunneling or  undermining noted. There is a medium amount of serosanguineous drainage noted. The wound margin is flat and intact. There is medium (34-66%) red, pink granulation within the wound bed. There is a medium (34-66%) amount of necrotic tissue within the wound bed including Adherent Slough. Wound #8 status is Open. Original cause of wound was Pressure Injury. The date acquired was: 10/18/2022. The wound has been in treatment 8 weeks. The wound is located on the Left,Lateral Lower Leg. The wound measures 2cm length x 2.5cm width x 0.1cm depth; 3.927cm^2 area and 0.393cm^3 volume. There is Fat Layer (Subcutaneous Tissue) exposed. There is no tunneling or undermining noted. There is a medium amount of serosanguineous drainage noted. There is medium (34-66%) pink granulation within the wound bed. There is a medium (34-66%) amount of necrotic tissue within the wound bed including Adherent Slough. 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 I debrided nonviable tissue. I recommended continue with Keystone antibiotic ointment and Aquacel under 3 layer compression. Procedures Wound #1 ANSLIE, SPADAFORA ([id number]) [id number] Page 11 of 12 Pre-procedure diagnosis of Wound #1 is a Venous Leg Ulcer located on the Left,Medial Lower Leg .Severity of Tissue Pre Debridement is: Fat layer exposed. There was a Excisional Skin/Subcutaneous Tissue Debridement with a total area of 4.5 sq cm performed by Kalman Shan, MD. With the following instrument(s): Curette to remove  Viable and Non-Viable tissue/material. Material removed includes Subcutaneous Tissue. No specimens were taken. A time out was conducted at 11:11, prior to the start of the procedure. A Minimum amount of bleeding was controlled with Pressure. The procedure was tolerated well with a pain level of 0 throughout and a pain level of 0 following the procedure. Post Debridement Measurements: 5cm length x 1.5cm width x 0.2cm depth; 1.178cm^3 volume. Character of Wound/Ulcer Post Debridement is improved. Severity of Tissue Post Debridement is: Fat layer exposed. Post procedure Diagnosis Wound #1: Same as Pre-Procedure Plan Follow-up Appointments: Return Appointment in 1 week. Nurse Visit as needed Home Health: [place]: Rocky Morel [phone] [place] for wound care. May utilize formulary equivalent dressing for wound treatment orders unless otherwise specified. Home Health Nurse may visit [facility] to address patientoos wound care needs. - home health to see patient 2 times per week, patient to be seen at wound clinic once per week **Please direct any NON-WOUND related issues/requests for orders to patient's Primary Care Physician. **If current dressing causes regression in wound condition, may D/C ordered dressing product/s and apply Normal Saline Moist Dressing daily until next [place] or Other MD appointment. **Notify [facility] of regression in wound condition at [phone]. Other Home Health Orders/Instructions: Bathing/ Shower/ Hygiene: May shower with wound dressing protected with water repellent cover or  cast protector. No tub bath. Anesthetic (Use 'Patient Medications' Section for Anesthetic Order Entry): Lidocaine applied to wound bed Edema Control - Lymphedema / Segmental Compressive Device / Other: Optional: One layer of unna paste to top of compression wrap (to act as an anchor). Elevate, Exercise Daily and Avoid Standing for Long Periods of  Time. Elevate legs to the level of the heart and pump ankles as often as possible Elevate leg(s) parallel to the floor when sitting. DO YOUR BEST to sleep in the bed at night. DO NOT sleep in your recliner. Long hours of sitting in a recliner leads to swelling of the legs and/or potential wounds on your backside. Additional Orders / Instructions: Follow Nutritious Diet and Increase Protein Intake Medications-Please add to medication list.: Keystone Compound WOUND #1: - Lower Leg Wound Laterality: Left, Medial Cleanser: Wound Cleanser 3 x Per Week/30 Days Discharge Instructions: Wash your hands with soap and water. Remove old dressing, discard into plastic bag and place into trash. Cleanse the wound with Wound Cleanser prior to applying a clean dressing using gauze sponges, not tissues or cotton balls. Do not scrub or use excessive force. Pat dry using gauze sponges, not tissue or cotton balls. Topical: Triamcinolone Acetonide Cream, 0.1%, 15 (g) tube 3 x Per Week/30 Days Discharge Instructions: lower leg Prim Dressing: AquacelAg Advantage Dressing, 2X2 (in/in) 3 x Per Week/30 Days ary Discharge Instructions: Apply to wound as directed Secondary Dressing: Zetuvit Plus 4x8 (in/in) 3 x Per Week/30 Days Com pression Wrap: 3-LAYER WRAP - Profore Lite LF 3 Multilayer Compression Bandaging System 3 x Per Week/30 Days Discharge Instructions: Apply 3 multi-layer wrap as prescribed. WOUND #8: - Lower Leg Wound Laterality: Left, Lateral Cleanser: Wound Cleanser 3 x Per Week/30 Days Discharge Instructions: Wash your hands with soap and water. Remove old dressing, discard into plastic bag and place into trash. Cleanse the wound with Wound Cleanser prior to applying a clean dressing using gauze sponges, not tissues or cotton balls. Do not scrub or use excessive force. Pat dry using gauze sponges, not tissue or cotton balls. Topical: Triamcinolone Acetonide Cream, 0.1%, 15 (g) tube 3 x Per Week/30  Days Discharge Instructions: Mix 1:1 with Nystatin cream and apply to periwound and leg Prim Dressing: AquacelAg Advantage Dressing, 2X2 (in/in) 3 x Per Week/30 Days ary Discharge Instructions: Apply to wound as directed Secondary Dressing: Zetuvit Plus 4x8 (in/in) 3 x Per Week/30 Days Com pression Wrap: 3-LAYER WRAP - Profore Lite LF 3 Multilayer Compression Bandaging System 3 x Per Week/30 Days Discharge Instructions: Apply 3 multi-layer wrap as prescribed. 1. In office sharp debridement 2. Aquacel with Keystone antibiotic ointment under 3 layer compressionooleft lower extremity 3. Follow-up in 1 week Electronic Signature(s) Signed: 12/13/2022 1:46:18 PM By: Kalman Shan DO Entered By: Kalman Shan on 12/13/2022 12:28:55 Laura Santiago ([id number]) [id number] Page 12 of 12 -------------------------------------------------------------------------------- SuperBill Details Patient Name: Date of Service: Laura Ace. 12/13/2022 Medical Record Number: [id number] Patient Account Number: [id number] Date of Birth/Sex: Treating RN: 05/13/1946 (77 y.o. 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 I87.311 Chronic venous hypertension (idiopathic) with ulcer of right lower extremity I87.2 Venous insufficiency (chronic) (peripheral) Z79.01 Long term (current) use of anticoagulants  I10 Essential (primary) hypertension 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 Facility Procedures : CPT4 Code: [id number] Description: 89169 - DEB SUBQ TISSUE 20 SQ CM/< ICD-10 Diagnosis Description L97.822  Non-pressure chronic ulcer of other part of left lower leg with fat layer expo 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 Modifier: sed Quantity: 1 Physician Procedures : CPT4 Code Description Modifier [id number] - WC PHYS SUBQ TISS 20 SQ CM ICD-10 Diagnosis Description 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.312 Chronic venous hypertension (idiopathic) with ulcer  of left lower extremity Quantity: 1 Electronic Signature(s) Signed: 12/13/2022 1:46:18 PM By: Kalman Shan DO Entered By: Kalman Shan on 12/13/2022 12:29:07

## 2022-12-15 NOTE — Progress Notes (Signed)
Laura Santiago, Laura Santiago ([id number]) [id number] Page 1 of 10 Visit Report for 12/13/2022 Arrival Information Details Patient Name: Date of Service: Laura Ace. 12/13/2022 11:15 A M Medical Record Number: [id number] Patient Account Number: [id number] Date of Birth/Sex: Treating RN: December 21, 1945 (77 y.o. Laura Santiago Primary Care Laura Santiago: Laura Santiago Other Clinician: Referring Skylen Danielsen: Treating Callaghan Laverdure/Extender: [name] Weeks in Treatment: 32 Visit Information History Since Last Visit Added or deleted any medications: No Patient Arrived: Laura Santiago Any new allergies or adverse reactions: No Arrival Time: 10:50 Had a fall or experienced change in No Accompanied By: self activities of daily living that may affect Transfer Assistance: None risk of falls: Patient Identification Verified: Yes Signs or symptoms of abuse/neglect since last visito No Secondary Verification Process Completed: Yes Hospitalized since last visit: No Patient Requires Transmission-Based No Implantable device outside of the clinic excluding No Precautions: cellular tissue based products placed in the center Patient Has Alerts: Yes since last visit: Patient Alerts: PT HAS SERVICE Has Dressing in Place as Prescribed: Yes ANIMAL Has Compression in Place as Prescribed: Yes ABI 07/11/21 Pain Present Now: No R) 1.16 L) 1.27 Electronic Signature(s) Signed: 12/14/2022 5:05:00 PM By: Laura Coria RN Entered By: Laura Santiago on 12/13/2022 10:51:06 -------------------------------------------------------------------------------- Clinic Level of Care Assessment Details Patient Name: Date of Service: Laura Ace. 12/13/2022 11:15 A M Medical Record Number: [id number] Patient Account Number: [id number] Date of Birth/Sex: Treating RN: [birth year] (77 y.o. Laura Santiago Primary Care Elveria Lauderbaugh: [place] Other Clinician: Referring  Saleh Ulbrich: Treating Leighton Luster/Extender: [name] Weeks in Treatment: 20 Clinic Level of Care Assessment Items TOOL 1 Quantity Score '[]'$  - 0 Use when EandM and Procedure is performed on INITIAL visit ASSESSMENTS - Nursing Assessment / Reassessment '[]'$  - 0 General Physical Exam (combine w/ comprehensive assessment (listed just below) when performed on new pt. evals) '[]'$  - 0 Comprehensive Assessment (HX, ROS, Risk Assessments, Wounds Hx, etc.) Laura, Santiago ([id number]) [id number] Page 2 of 10 ASSESSMENTS - Wound and Skin Assessment / Reassessment '[]'$  - 0 Dermatologic / Skin Assessment (not related to wound area) ASSESSMENTS - Ostomy and/or Continence Assessment and Care '[]'$  - 0 Incontinence Assessment and Management '[]'$  - 0 Ostomy Care Assessment and Management (repouching, etc.) PROCESS - Coordination of Care '[]'$  - 0 Simple Patient / Family Education for ongoing care '[]'$  - 0 Complex (extensive) Patient / Family Education for ongoing care '[]'$  - 0 Staff obtains Programmer, systems, Records, T Results / Process Orders est '[]'$  - 0 Staff telephones HHA, Nursing Homes / Clarify orders / etc '[]'$  - 0 Routine Transfer to another Facility (non-emergent condition) '[]'$  - 0 Routine Hospital Admission (non-emergent condition) '[]'$  - 0 New Admissions / Biomedical engineer / Ordering NPWT Apligraf, etc. , '[]'$  - 0 Emergency Hospital Admission (emergent condition) PROCESS - Special Needs '[]'$  - 0 Pediatric / Minor Patient Management '[]'$  - 0 Isolation Patient Management '[]'$  - 0 Hearing / Language / Visual special needs '[]'$  - 0 Assessment of Community assistance (transportation, D/C planning, etc.) '[]'$  - 0 Additional assistance / Altered mentation '[]'$  - 0 Support Surface(s) Assessment (bed, cushion, seat, etc.) INTERVENTIONS - Miscellaneous '[]'$  - 0 External ear exam '[]'$  - 0 Patient Transfer (multiple staff / Civil Service fast streamer / Similar devices) '[]'$  -  0 Simple Staple / Suture removal (25 or less) '[]'$  - 0 Complex Staple / Suture removal (26 or more) '[]'$  - 0 Hypo/Hyperglycemic Management (do not  check if billed separately) '[]'$  - 0 Ankle / Brachial Index (ABI) - do not check if billed separately Has the patient been seen at the hospital within the last three years: Yes Total Score: 0 Level Of Care: ____ Electronic Signature(s) Signed: 12/14/2022 5:05:00 PM By: Laura Coria RN Entered By: Laura Santiago on 12/13/2022 11:43:36 -------------------------------------------------------------------------------- Encounter Discharge Information Details Patient Name: Date of Service: Laura Jewett [name] 12/13/2022 11:15 A M Medical Record Number: [id number] Patient Account Number: [id number] Date of Birth/Sex: Treating RN: 07-18-1946 (77 y.o. Laura Santiago Primary Care Daniah Zaldivar: [place] Other Clinician: Referring Yeraldine Forney: Treating Rozelle Caudle/Extender: [facility] [place], [place] ([id number]) [phone].pdf Page 3 of 10 Weeks in Treatment: 35 Encounter Discharge Information Items Post Procedure Vitals Discharge Condition: Stable Temperature (F): 97.7 Ambulatory Status: Walker Pulse (bpm): 79 Discharge Destination: Home Respiratory Rate (breaths/min): 16 Transportation: Private Auto Blood Pressure (mmHg): 135/73 Accompanied By: self Schedule Follow-up Appointment: Yes Clinical Summary of Care: Electronic Signature(s) Signed: 12/13/2022 11:45:26 AM By: Laura Coria RN Entered By: Laura Santiago on 12/13/2022 11:45:25 -------------------------------------------------------------------------------- Lower Extremity Assessment Details Patient Name: Date of Service: Laura Ace. 12/13/2022 11:15 A M Medical Record Number: [id number] Patient Account Number: [id number] Date of Birth/Sex: Treating RN: [birth year] (77 y.o. Laura Santiago Primary Care Deshara Rossi: [place]  Other Clinician: Referring Avelina Mcclurkin: Treating Reisha Wos/Extender: [name] Weeks in Treatment: 75 Edema Assessment Assessed: [Left: No] [Right: No] Edema: [Left: Ye] [Right: s] Calf Left: Right: Point of Measurement: 35 cm From Medial Instep 34 cm Ankle Left: Right: Point of Measurement: 12 cm From Medial Instep 21 cm Knee To Floor Left: Right: From Medial Instep 48 cm Vascular Assessment Pulses: Dorsalis Pedis Palpable: [Left:Yes] Electronic Signature(s) Signed: 12/14/2022 5:05:00 PM By: Laura Coria RN Entered By: Laura Santiago on 12/13/2022 11:05:40 Laura Santiago ([id number]) [id number] Page 4 of 10 -------------------------------------------------------------------------------- Multi Wound Chart Details Patient Name: Date of Service: Laura Ace. 12/13/2022 11:15 A M Medical Record Number: [id number] Patient Account Number: [id number] Date of Birth/Sex: Treating RN: July 09, 1946 (77 y.o. Laura Santiago Primary Care Tajai Suder: [facility] Other Clinician: Referring Tinna Kolker: Treating Jorge Amparo/Extender: [name] Weeks in Treatment: 84 Vital Signs Height(in): 66 Pulse(bpm): 79 Weight(lbs): 153 Blood Pressure(mmHg): 135/73 Body Mass Index(BMI): 24.7 Temperature(F): 97.7 Respiratory Rate(breaths/min): 16 [1:Photos:] [N/A:N/A] Left, Medial Lower Leg Left, Lateral Lower Leg N/A Wound Location: Gradually Appeared Pressure Injury N/A Wounding Event: Venous Leg Ulcer Pressure Ulcer N/A Primary Etiology: Hypertension, Osteoarthritis, ReceivedHypertension, Osteoarthritis, ReceivedN/A Comorbid History: Chemotherapy, Received Radiation Chemotherapy, Received Radiation 04/06/2021 10/18/2022 N/A Date Acquired: 75 8 N/A Weeks of Treatment: Open Open N/A Wound Status: No No N/A Wound Recurrence: Yes No N/A Clustered Wound: 5x1.5x0.1 2x2.5x0.1 N/A Measurements L x W x D  (cm) 5.89 3.927 N/A A (cm) : rea 0.589 0.393 N/A Volume (cm) : 93.50% 5.50% N/A % Reduction in Area: 96.70% 52.70% N/A % Reduction in Volume: Full Thickness Without Exposed Category/Stage II N/A Classification: Support Structures Medium Medium N/A Exudate Amount: Serosanguineous Serosanguineous N/A Exudate Type: red, brown red, brown N/A Exudate Color: Flat and Intact N/A N/A Wound Margin: Medium (34-66%) Medium (34-66%) N/A Granulation Amount: Red, Pink Pink N/A Granulation Quality: Medium (34-66%) Medium (34-66%) N/A Necrotic Amount: Fat Layer (Subcutaneous Tissue): Yes Fat Layer (Subcutaneous Tissue): Yes N/A Exposed Structures: Fascia: No Fascia: No Tendon: No Tendon: No Muscle: No Muscle: No Joint: No Joint: No  Bone: No Bone: No Small (1-33%) None N/A Epithelialization: Treatment Notes Electronic Signature(s) Signed: 12/14/2022 5:05:00 PM By: Laura Coria RN Entered By: Laura Santiago on 12/13/2022 11:05:45 Laura Santiago ([id number]) [id number] Page 5 of 10 -------------------------------------------------------------------------------- Multi-Disciplinary Care Plan Details Patient Name: Date of Service: Laura Ace. 12/13/2022 11:15 A M Medical Record Number: [id number] Patient Account Number: [id number] Date of Birth/Sex: Treating RN: [birth year] (78 y.o. Laura Santiago Primary Care Mishti Swanton: [place] Other Clinician: Referring Faythe Heitzenrater: Treating Kaylana Fenstermacher/Extender: [name] Weeks in Treatment: 50 Active Inactive Necrotic Tissue Nursing Diagnoses: Impaired tissue integrity related to necrotic/devitalized tissue Knowledge deficit related to management of necrotic/devitalized tissue Goals: Necrotic/devitalized tissue will be minimized in the wound bed Date Initiated: 07/06/2021 Target Resolution Date: 12/13/2022 Goal Status: Active Patient/caregiver will verbalize  understanding of reason and process for debridement of necrotic tissue Date Initiated: 07/06/2021 Date Inactivated: 10/05/2021 Target Resolution Date: 07/06/2021 Goal Status: Met Interventions: Assess patient pain level pre-, during and post procedure and prior to discharge Provide education on necrotic tissue and debridement process Treatment Activities: Apply topical anesthetic as ordered : 07/06/2021 Biologic debridement : 07/06/2021 Enzymatic debridement : 07/06/2021 Excisional debridement : 07/06/2021 Notes: Soft Tissue Infection Nursing Diagnoses: Impaired tissue integrity Potential for infection: soft tissue Goals: Patient's soft tissue infection will resolve Date Initiated: 03/15/2022 Target Resolution Date: 12/13/2022 Goal Status: Active Signs and symptoms of infection will be recognized early to allow for prompt treatment Date Initiated: 03/15/2022 Target Resolution Date: 12/13/2022 Goal Status: Active Interventions: Assess signs and symptoms of infection every visit Treatment Activities: Culture and sensitivity : 03/15/2022 Notes: Electronic Signature(s) Signed: 12/13/2022 11:44:18 AM By: Laura Coria RN Laura Santiago [id number] By: Laura Coria RN [id number].pdf Page 6 of 10 Signed: 12/13/2022 11:44:18 Entered By: Laura Santiago on 12/13/2022 11:44:18 -------------------------------------------------------------------------------- Pain Assessment Details Patient Name: Date of Service: Laura Ace. 12/13/2022 11:15 A M Medical Record Number: [id number] Patient Account Number: [id number] Date of Birth/Sex: Treating RN: 1946-03-11 (77 y.o. Laura Santiago Primary Care Tamaira Ciriello: [place] Other Clinician: Referring Jahyra Sukup: Treating Annaliese Saez/Extender: [name] Weeks in Treatment: 6 Active Problems Location of Pain Severity and Description of Pain Patient Has Paino No Site Locations Rate the  pain. Current Pain Level: 0 Pain Management and Medication Current Pain Management: Electronic Signature(s) Signed: 12/14/2022 5:05:00 PM By: Laura Coria RN Entered By: Laura Santiago on 12/13/2022 10:52:00 -------------------------------------------------------------------------------- Patient/Caregiver Education Details Patient Name: Date of Service: Laura Jewett [name] 1/3/2024andnbsp11:15 [facility] Record Number: [id number] Patient Account Number: [id number] Date of Birth/Gender: Treating RN: 10-28-1946 (77 y.o. [address], [place], [place] ([id number]) [phone].pdf Page 7 of 10 Primary Care Physician: [place] Other Clinician: Referring Physician: Treating Physician/Extender: [place], [place] Weeks in Treatment: 74 Education Assessment Education Provided To: Patient Education Topics Provided Wound/Skin Impairment: Methods: Explain/Verbal Responses: State content correctly Electronic Signature(s) Signed: 12/14/2022 5:05:00 PM By: Laura Coria RN Entered By: Laura Santiago on 12/13/2022 11:44:05 -------------------------------------------------------------------------------- Wound Assessment Details Patient Name: Date of Service: Laura Jewett [name] 12/13/2022 11:15 A M Medical Record Number: [id number] Patient Account Number: [id number] Date of Birth/Sex: Treating RN: 1946/07/02 (77 y.o. Laura Santiago Primary Care Izayah Miner: [place] Other Clinician: Referring Verdean Murin: Treating Lya Holben/Extender: [name] Weeks in Treatment: 75 Wound Status Wound Number: 1 Primary Venous Leg Ulcer Etiology: Wound Location: Left, Medial Lower Leg  Wound Status: Open Wounding Event: Gradually Appeared Comorbid Hypertension, Osteoarthritis, Received Chemotherapy, Date Acquired: 04/06/2021 History: Received Radiation Weeks Of Treatment: 75 Clustered Wound: Yes Photos Wound  Measurements Length: (cm) 5 Width: (cm) 1.5 Depth: (cm) 0.1 Area: (cm) 5.89 Volume: (cm) 0.589 % Reduction in Area: 93.5% % Reduction in Volume: 96.7% Epithelialization: Small (1-33%) Tunneling: No Undermining: No Wound Description ADYSEN, RAPHAEL ([id number]) Classification: Full Thickness Without Exposed Support Structures Wound Margin: Flat and Intact Exudate Amount: Medium Exudate Type: Serosanguineous Exudate Color: red, brown [id number] Page 8 of 10 Foul Odor After Cleansing: No Slough/Fibrino Yes Wound Bed Granulation Amount: Medium (34-66%) Exposed Structure Granulation Quality: Red, Pink Fascia Exposed: No Necrotic Amount: Medium (34-66%) Fat Layer (Subcutaneous Tissue) Exposed: Yes Necrotic Quality: Adherent Slough Tendon Exposed: No Muscle Exposed: No Joint Exposed: No Bone Exposed: No Treatment Notes Wound #1 (Lower Leg) Wound Laterality: Left, Medial Cleanser Wound Cleanser Discharge Instruction: Wash your hands with soap and water. Remove old dressing, discard into plastic bag and place into trash. Cleanse the wound with Wound Cleanser prior to applying a clean dressing using gauze sponges, not tissues or cotton balls. Do not scrub or use excessive force. Pat dry using gauze sponges, not tissue or cotton balls. Peri-Wound Care Topical Triamcinolone Acetonide Cream, 0.1%, 15 (g) tube Discharge Instruction: lower leg Primary Dressing Secondary Dressing Zetuvit Plus 4x8 (in/in) Secured With Compression Wrap Compression Stockings Add-Ons Electronic Signature(s) Signed: 12/14/2022 5:05:00 PM By: Laura Coria RN Entered By: Laura Santiago on 12/13/2022 11:03:19 -------------------------------------------------------------------------------- Wound Assessment Details Patient Name: Date of Service: Laura Jewett [name] 12/13/2022 11:15 A M Medical Record Number: [id number] Patient Account Number: [id number] Date of Birth/Sex:  Treating RN: July 04, 1946 (77 y.o. Laura Santiago Primary Care Dasha Kawabata: [place] Other Clinician: Referring Iyan Flett: Treating Labaron Digirolamo/Extender: [name] Weeks in Treatment: 75 Wound Status Wound Number: 8 Primary Pressure Ulcer Etiology: Wound Location: Left, Lateral Lower Leg Wound Status: Open Wounding Event: Pressure Injury Comorbid Hypertension, Osteoarthritis, Received Chemotherapy, Date Acquired: 10/18/2022 History: Received Radiation Weeks Of Treatment: 8 SHELLYE, ZANDI M ([id number]) [id number] Page 9 of 10 Clustered Wound: No Photos Wound Measurements Length: (cm) 2 Width: (cm) 2.5 Depth: (cm) 0.1 Area: (cm) 3.927 Volume: (cm) 0.393 % Reduction in Area: 5.5% % Reduction in Volume: 52.7% Epithelialization: None Tunneling: No Undermining: No Wound Description Classification: Category/Stage II Exudate Amount: Medium Exudate Type: Serosanguineous Exudate Color: red, brown Foul Odor After Cleansing: No Slough/Fibrino Yes Wound Bed Granulation Amount: Medium (34-66%) Exposed Structure Granulation Quality: Pink Fascia Exposed: No Necrotic Amount: Medium (34-66%) Fat Layer (Subcutaneous Tissue) Exposed: Yes Necrotic Quality: Adherent Slough Tendon Exposed: No Muscle Exposed: No Joint Exposed: No Bone Exposed: No Electronic Signature(s) Signed: 12/14/2022 5:05:00 PM By: Laura Coria RN Entered By: Laura Santiago on 12/13/2022 11:04:22 -------------------------------------------------------------------------------- Vitals Details Patient Name: Date of Service: [name], [place]. 12/13/2022 11:15 A M Medical Record Number: [id number] Patient Account Number: [id number] Date of Birth/Sex: Treating RN: [birth year] (77 y.o. Laura Santiago Primary Care Shakya Sebring: [place] Other Clinician: Referring Gilmar Bua: Treating Nikkolas Coomes/Extender: [name] Weeks in  Treatment: 38 Vital Signs Time Taken: 10:51 Temperature (F): 97.7 Height (in): 66 Pulse (bpm): 79 Weight (lbs): 153 Respiratory Rate (breaths/min): 16 Body Mass Index (BMI): 24.7 Blood Pressure (mmHg): 135/73 Reference Range: 80 - 120 mg / dl JEWELZ, RICKLEFS ([id number]) [id number] Page 10 of 10 Electronic Signature(s) Signed: 12/14/2022 5:05:00 PM By: Laura Coria RN Entered By:  Laura Santiago on 12/13/2022 10:51:30

## 2022-12-19 ENCOUNTER — Ambulatory Visit: Admission: RE | Admit: 2022-12-19 | Payer: Medicare Other | Source: Ambulatory Visit

## 2022-12-19 ENCOUNTER — Ambulatory Visit (HOSPITAL_BASED_OUTPATIENT_CLINIC_OR_DEPARTMENT_OTHER): Payer: Medicare Other | Admitting: Cardiology

## 2022-12-19 ENCOUNTER — Encounter: Admission: RE | Admit: 2022-12-19 | Payer: Medicare Other | Source: Ambulatory Visit

## 2022-12-20 ENCOUNTER — Encounter: Payer: Medicare Other | Admitting: Internal Medicine

## 2022-12-20 ENCOUNTER — Non-Acute Institutional Stay: Payer: Medicare Other | Admitting: Nurse Practitioner

## 2022-12-20 ENCOUNTER — Encounter: Payer: Self-pay | Admitting: Oncology

## 2022-12-21 ENCOUNTER — Encounter: Payer: Medicare Other | Admitting: Nurse Practitioner

## 2022-12-21 NOTE — Progress Notes (Signed)
  This encounter was created in error - please disregard.  This encounter was created in error - please disregard.  This encounter was created in error - please disregard.  This encounter was created in error - please disregard. 

## 2022-12-22 ENCOUNTER — Inpatient Hospital Stay: Payer: Medicare Other | Admitting: Oncology

## 2022-12-22 NOTE — Progress Notes (Signed)
Cardiology Office Note:    Date:  12/27/2022   ID:  Laura Santiago, DOB November 04, 1946, MRN [id number]  PCP:  [facility]  Cardiologist:  Buford Dresser, MD  Referring MD: [name], MD   CC:  New patient evaluation for reduced EF  History of Present Illness:    Laura Santiago is a 77 y.o. female with a hx of DVT, hypertension, hyperparathyroidism, GERD, CKD IV, anemia, arthritis, breast cancer, ADHD, who is seen as a new consult at the request of [name], MD for the evaluation and management of reduced ejection fraction.  She was seen by Dr. Janese Banks 12/05/2022. Echocardiogram  11/30/2022 showed LVEF 42-35%, grade 2 diastolic dysfunction, mild to moderate mitral valve regurgitation, mild aortic valve regurgitation, and aortic valve sclerosis/calcification. She was referred for a CT performed 12/25/22 which revealed aortic atherosclerosis, unchanged sclerotic osseous metastatic lesions throughout the included axial and proximal appendicular skeleton, unchanged 0.5 cm right lower lobe pulmonary nodule, and unchanged chronic left subclavian venous stenosis about a port catheter.   Today, she is accompanied by her daughter.  Cardiovascular risk factors: Prior clinical ASCVD:   Aortic atherosclerosis Comorbid conditions:  Hypertension. CKD IV. Tobacco use history:  Never. Family history: Her father had hypertension and CAD with blockages. Her mother died of pulmonary issues. Two of her brothers were prescribed statins. Prior pertinent testing and/or incidental findings: Echocardiogram  11/30/2022 showed LVEF 36-14%, grade 2 diastolic dysfunction, mild to moderate mitral valve regurgitation, mild aortic valve regurgitation, and aortic valve sclerosis/calcification. Exercise level: Usually she would walk her dog 4 times a day, but not lately.  Lately she does not believe she has been retaining fluid aside from her stable LE edema. She denies shortness of breath. No chest  pain.  She confirms her last chemotherapy treatment was about a month ago. Her recent CT scans as above showed no acute changes. Originally she had developed cancer in 2014, with recurrence last year. She reports worsening blood pressure control since starting her oncologic medications. Prior radiation therapy and surgery as well.  Last week she was restarted on adderall.   She also endorses a history of scarlet fever.  She denies any palpitations, lightheadedness, headaches, syncope, orthopnea, or PND.  Past Medical History:  Diagnosis Date   ADHD (attention deficit hyperactivity disorder)    in [place], no date on md note   Anemia    IDA 11/26/2019, Anemia in stage 4 chronic kidney disease ([place]) 09/03/2020   Arthritis    osteoarthritis right knee 09/30/2014   Breast cancer ([place]) 10/05/2013   in [place], [place] -, Her 2 is 3+   Cancer related pain 03/28/2021   in [name], md notes spine mets   cervical compression fracture 03/18/2021   in [place]   DVT of lower extremity, bilateral ([place]) 03/30/2014   in [name]   Generalized muscle weakness 03/31/2016   in [name]   GERD (gastroesophageal reflux disease) 05/15/2013   per md in [name]   Hyperparathyroidism, secondary ([place]) 04/25/2018   in [name]   Hypertension 02/20/2016   info from MD in [place] loss 02/05/2015   in [name]   Metabolic acidosis 43/15/4008   in [place]   Metabolic syndrome 67/61/9509   in [name]   Osteopenia after menopause 03/29/2016   in [name]   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   in [name]   Stasis ulcer of left lower extremity ([place]) 03/29/2016   in [name]  Past Surgical History:  Procedure Laterality Date   CESAREAN SECTION     unknown   fibroid removed  N/A    in [place] - unknown date   IR FLUORO GUIDE CV LINE LEFT  07/27/2021   IR PORT REPAIR CENTRAL VENOUS ACCESS DEVICE Left    In [place] N/A 02/25/2014   in [name]   ovary removed      unknown   PORTA CATH INSERTION N/A 02/13/2022   Procedure:  PORTA CATH INSERTION;  Surgeon: Algernon Huxley, MD;  Location: [facility];  Service: Cardiovascular;  Laterality: N/A;   [place] CATARACT EXTRACAP,INSERT LENS Bilateral  Bilateral 09/05/2012   in [place]     unknown    LUMPECTOMY Right 04/03/2014   in [place]    Current Medications: Current Outpatient Medications on File Prior to Visit  Medication Sig   acetaminophen (TYLENOL) 500 MG tablet Take 500 mg by mouth every 4 (four) hours as needed.   amphetamine-dextroamphetamine (ADDERALL XR) 30 MG 24 hr capsule Take 1 capsule (30 mg total) by mouth daily.   calcium citrate (CALCITRATE - DOSED IN MG ELEMENTAL CALCIUM) 950 (200 Ca) MG tablet Take 200 mg of elemental calcium by mouth daily.   doxycycline (VIBRA-TABS) 100 MG tablet Take 1 tablet (100 mg total) by mouth 2 (two) times daily.   DULoxetine (CYMBALTA) 30 MG capsule Take by mouth.   fentaNYL (DURAGESIC) 25 MCG/HR PLACE 1 PATCH ONTO THE SKIN EVERY THREE DAYS AS DIRECTED.   fluocinonide (LIDEX) 0.05 % external solution Apply once or twice daily to scalp as needed for itching.  Avoid applying to face, groin, and axilla.   gabapentin (NEURONTIN) 100 MG capsule TAKE 1 CAPSULE BY MOUTH 3 TIMES DAILY   hydrocortisone 2.5 % cream Apply 1 to 2 times daily for itchy rash at face, scalp, ears, and forehead.   hydrOXYzine (ATARAX) 25 MG tablet Take by mouth.   ketoconazole (NIZORAL) 2 % shampoo apply every other day  massage into scalp and leave in for 5 minutes before rinsing out   lisinopril (ZESTRIL) 20 MG tablet Take 1 tablet (20 mg total) by mouth daily.   LORazepam (ATIVAN) 0.5 MG tablet Take 1 tablet (0.5 mg total) by mouth daily as needed for anxiety.   Multiple Vitamin (MULTI-VITAMIN) tablet Take 1 tablet by mouth daily.   naloxone (NARCAN) nasal spray 4 mg/0.1 mL SPRAY 1 SPRAY INTO ONE NOSTRIL AS DIRECTED FOR OPIOID OVERDOSE (TURN PERSON ON SIDE AFTER DOSE. IF NO RESPONSE IN 2-3 MINUTES OR PERSON RESPONDS BUT RELAPSES,  REPEAT USING A NEW SPRAY DEVICE AND SPRAY INTO THE OTHER NOSTRIL. CALL 911 AFTER USE.) * EMERGENCY USE ONLY *   NON FORMULARY Apply 1 Dose topically daily. Pt mixes up piperacillim and tazobactam and puts it on the wounds of left leg   ondansetron (ZOFRAN) 8 MG tablet Take 1 tablet (8 mg total) by mouth as needed for nausea or vomiting.   Oxycodone HCl 10 MG TABS Take 1.5 tablets (15 mg total) by mouth every 6 (six) hours as needed.   rivaroxaban (XARELTO) 20 MG TABS tablet Take 1 tablet (20 mg total) by mouth daily with supper.   sodium hypochlorite (DAKIN'S 1/4 STRENGTH) 0.125 % SOLN Apply 1 application topically as directed. Moisten gauze with solution and wrap wound   Current Facility-Administered Medications on File Prior to Visit  Medication   heparin lock flush 100 UNIT/ML injection     Allergies:  Other, Technetium tc 43mmedronate, Corticosteroids, Sulfa antibiotics, and Celebrex [celecoxib]   Social History   Tobacco Use   Smoking status: Never   Smokeless tobacco: Never  Vaping Use   Vaping Use: Never used  Substance Use Topics   Alcohol use: Not Currently   Drug use: Not Currently    Family History: family history includes Cancer - Prostate in her brother; Diabetes in her father; Heart disease in her father; Hypertension in her father; Pancreatic cancer in her mother; Skin cancer in her brother and father; Stroke in her father; Varicose Veins in her father.  ROS:   Please see the history of present illness.  Additional pertinent ROS: Constitutional: Negative for chills, fever, night sweats, unintentional weight loss  HENT: Negative for ear pain and hearing loss.   Eyes: Negative for loss of vision and eye pain.  Respiratory: Negative for cough, sputum, wheezing.   Cardiovascular: See HPI. Gastrointestinal: Negative for abdominal pain, melena, and hematochezia.  Genitourinary: Negative for dysuria and hematuria.  Musculoskeletal: Negative for falls and myalgias.   Skin: Negative for itching and rash.  Neurological: Negative for focal weakness, focal sensory changes and loss of consciousness.  Endo/Heme/Allergies: Does not bruise/bleed easily.     EKGs/Labs/Other Studies Reviewed:    The following studies were reviewed today:  CT Chest/Abdomen/Pelvis  12/25/2022: IMPRESSION: 1. Unchanged sclerotic osseous metastatic lesions throughout the included axial and proximal appendicular skeleton. 2. No significant change in enlarged bilateral inguinal lymph nodes. No new lymphadenopathy. 3. Unchanged 0.5 cm right lower lobe pulmonary nodule. No new nodules. 4. Unchanged chronic left subclavian venous stenosis about a port catheter. 5. Unchanged common bile duct dilatation without intrahepatic biliary ductal dilatation. 6. Large burden of stool and stool balls.   Aortic Atherosclerosis (ICD10-I70.0).  Echo  11/30/2022:  1. Left ventricular ejection fraction, by estimation, is 35 to 40%. The  left ventricle has moderately decreased function. The left ventricle  demonstrates global hypokinesis. Left ventricular diastolic parameters are  consistent with Grade II diastolic  dysfunction (pseudonormalization). The average left ventricular global  longitudinal strain is -13.6 %.   2. Right ventricular systolic function is normal. The right ventricular  size is normal.   3. Left atrial size was mildly dilated.   4. The mitral valve is normal in structure. Mild to moderate mitral valve  regurgitation. No evidence of mitral stenosis.   5. The aortic valve is normal in structure. Aortic valve regurgitation is  mild. Aortic valve sclerosis/calcification is present, without any  evidence of aortic stenosis.   6. The inferior vena cava is normal in size with greater than 50%  respiratory variability, suggesting right atrial pressure of 3 mmHg.   EKG:  EKG is personally reviewed.   12/26/2022: NSR at 74 bpm with nonspecific t wave abnormality  Recent  Labs: 03/09/2022: TSH 3.604 11/14/2022: ALT 13; BUN 27; Hemoglobin 10.1; Platelets 317; Potassium 4.1; Sodium 141 12/25/2022: Creatinine, Ser 1.40   Recent Lipid Panel No results found for: "CHOL", "TRIG", "HDL", "CHOLHDL", "VLDL", "LDLCALC", "LDLDIRECT"  Physical Exam:    VS:  BP 130/68 (BP Location: Left Arm, Patient Position: Sitting, Cuff Size: Normal)   Pulse 74   Ht '5\' 5"'$  (1.651 m)   Wt 145 lb 9.6 oz (66 kg)   BMI 24.23 kg/m     Wt Readings from Last 3 Encounters:  12/26/22 145 lb 9.6 oz (66 kg)  12/05/22 143 lb 3.2 oz (65 kg)  11/14/22 142 lb 3.2 oz (64.5 kg)  GEN: Well nourished, well developed in no acute distress HEENT: Normal, moist mucous membranes NECK: No JVD CARDIAC: regular rhythm, normal S1 and S2, no rubs or gallops. No murmur. VASCULAR: Radial and DP pulses 2+ bilaterally. No carotid bruits RESPIRATORY:  Clear to auscultation without rales, wheezing or rhonchi  ABDOMEN: Soft, non-tender, non-distended MUSCULOSKELETAL:  Ambulates independently; LLE wrapped due to slow-healing lesion. Erythematous skin of right shin. No significant edema. SKIN: Warm and dry, no edema NEUROLOGIC:  Alert and oriented x 3. No focal neuro deficits noted. PSYCHIATRIC:  Normal affect    ASSESSMENT:    1. Primary malignant neoplasm of breast with metastasis ([place])   2. Cardiomyopathy, unspecified type ([place])   3. Chemotherapy induced cardiomyopathy ([place])   4. Status post administration of cardiotoxic chemotherapy    PLAN:    Metastatic breast cancer New cardiomyopathy, suspect 2/2 chemotherapy History of cardiotoxic chemotherapy -Suspect Herceptin is the etiology of her possible chemotherapy-induced cardiomyopathy.  She endorses receiving Herceptin both during her initial diagnosis 10 years ago and with her recent recurrence. -She has also had breast radiation therapy to in the past, but not recently.  Her drop in EF is recent -Very difficult situation.  She has chronic kidney  disease that limits use of guideline directed medical therapy.  She is currently on an ACE inhibitor, and her last GFR was 43.  If blood pressure and renal function allows, we may be able to switch to an ARB at follow-up.  Given her chronic kidney disease she would benefit from an SGLT2 inhibitor, so we will start Jardiance today. -We reviewed that she is on Adderall, which is a stimulant.  I would like to put her on a beta-blocker, but I would also prefer her to be off of the Adderall.  She endorses significant fatigue off of Adderall, which is why she recently restarted it.  I will reach out to Dr. Janese Banks to discuss options, but ideally would like her to be on a low-dose of the beta-blocker.  We did discuss that this may worsen her fatigue. -She appears euvolemic at this time, and does not appear to need a standing diuretic.  We did discuss that Jardiance can be a mild diuretic, and she should avoid dehydration. -We also discussed at length that it is unclear whether her ejection fraction will recover.  Has not recovered with cessation of chemotherapy alone. -If ejection fraction remains reduced on maximum tolerated guideline directed medical therapy, we did briefly discuss ischemic evaluation.  She is very hesitant about this at this time, but based on her future echo findings and clinical condition we will continue to address  It is difficult for her to make it to in person medical visits as she lives alone, so she would prefer to do as much follow-up as possible by virtual visits.  She has high risk medical conditions that require high level medical decision making.  Plan for follow up: 2 weeks virtually, or sooner as needed.  Buford Dresser, MD, PhD, [facility]    Medication Adjustments/Labs and Tests Ordered: Current medicines are reviewed at length with the patient today.  Concerns regarding medicines are outlined above.   Orders Placed This Encounter  Procedures    EKG 12-Lead   Meds ordered this encounter  Medications   empagliflozin (JARDIANCE) 10 MG TABS tablet    Sig: Take 1 tablet (10 mg total) by mouth daily before breakfast.    Dispense:  30 tablet  Refill:  11   Patient Instructions  We are going to start a new medication, Jardiance. This is the one that is heart and kidney protective. Make sure you stay hydrated with this. Watch for urinary tract or yeast infection.  I will reach out to Dr. Janese Banks about the adderall and chemo plans.  Try to be as active as you can.   Follow up virtual visit 2 weeks with Dr Randall An [place] as a scribe for Buford Dresser, MD.,have documented all relevant documentation on the behalf of Buford Dresser, MD,as directed by  Buford Dresser, MD while in the presence of Buford Dresser, MD.  I, Buford Dresser, MD, have reviewed all documentation for this visit. The documentation on 12/27/22 for the exam, diagnosis, procedures, and orders are all accurate and complete.   Signed, Buford Dresser, MD PhD 12/27/2022 12:29 PM    [place]

## 2022-12-25 ENCOUNTER — Ambulatory Visit
Admission: RE | Admit: 2022-12-25 | Discharge: 2022-12-25 | Disposition: A | Discharge status: Home / Self Care | Payer: Medicare Other | Source: Ambulatory Visit | Attending: Oncology | Admitting: Oncology

## 2022-12-25 ENCOUNTER — Other Ambulatory Visit: Payer: Self-pay | Admitting: Oncology

## 2022-12-25 DIAGNOSIS — C50919 Malignant neoplasm of unspecified site of unspecified female breast: Secondary | ICD-10-CM | POA: Diagnosis not present

## 2022-12-25 DIAGNOSIS — C7951 Secondary malignant neoplasm of bone: Secondary | ICD-10-CM

## 2022-12-25 LAB — POCT I-STAT CREATININE: Creatinine, Ser: 1.4 mg/dL — ABNORMAL HIGH (ref 0.44–1.00)

## 2022-12-25 MED ORDER — FENTANYL 25 MCG/HR TD PT72: MEDICATED_PATCH | TRANSDERMAL | 0 refills | Status: DC

## 2022-12-25 MED ORDER — IOHEXOL 300 MG/ML  SOLN
80.0000 mL | Freq: Once | INTRAMUSCULAR | Status: AC | PRN
  Administered 2022-12-25: 80 mL via INTRAVENOUS

## 2022-12-25 MED ORDER — TECHNETIUM TC 99M MEDRONATE IV KIT
23.5000 | PACK | Freq: Once | INTRAVENOUS | Status: AC | PRN
  Administered 2022-12-25: 23.5 via INTRAVENOUS

## 2022-12-26 ENCOUNTER — Encounter (HOSPITAL_BASED_OUTPATIENT_CLINIC_OR_DEPARTMENT_OTHER): Payer: Self-pay | Admitting: Cardiology

## 2022-12-26 ENCOUNTER — Telehealth (HOSPITAL_BASED_OUTPATIENT_CLINIC_OR_DEPARTMENT_OTHER): Payer: Self-pay | Admitting: *Deleted

## 2022-12-26 ENCOUNTER — Ambulatory Visit (INDEPENDENT_AMBULATORY_CARE_PROVIDER_SITE_OTHER): Payer: Medicare Other | Admitting: Cardiology

## 2022-12-26 VITALS — BP 130/68 | HR 74 | Ht 65.0 in | Wt 145.6 lb

## 2022-12-26 DIAGNOSIS — T451X5A Adverse effect of antineoplastic and immunosuppressive drugs, initial encounter: Secondary | ICD-10-CM | POA: Diagnosis not present

## 2022-12-26 DIAGNOSIS — C50919 Malignant neoplasm of unspecified site of unspecified female breast: Secondary | ICD-10-CM

## 2022-12-26 DIAGNOSIS — Z9221 Personal history of antineoplastic chemotherapy: Secondary | ICD-10-CM | POA: Diagnosis not present

## 2022-12-26 DIAGNOSIS — I429 Cardiomyopathy, unspecified: Secondary | ICD-10-CM

## 2022-12-26 DIAGNOSIS — I427 Cardiomyopathy due to drug and external agent: Secondary | ICD-10-CM

## 2022-12-26 MED ORDER — EMPAGLIFLOZIN 10 MG PO TABS: 10.0000 mg | ORAL_TABLET | Freq: Every day | ORAL | 11 refills | Status: AC

## 2022-12-26 NOTE — Patient Instructions (Addendum)
We are going to start a new medication, Jardiance. This is the one that is heart and kidney protective. Make sure you stay hydrated with this. Watch for urinary tract or yeast infection.  I will reach out to Dr. Janese Banks about the adderall and chemo plans.  Try to be as active as you can.   Follow up virtual visit 2 weeks with Dr Harrell Gave

## 2022-12-26 NOTE — Telephone Encounter (Signed)
Patient in for visit today  Samples Jardiance 10 mg #2 Lot #[id number] exp 11/25

## 2022-12-27 ENCOUNTER — Encounter (HOSPITAL_BASED_OUTPATIENT_CLINIC_OR_DEPARTMENT_OTHER): Payer: Medicare Other | Admitting: Internal Medicine

## 2022-12-27 ENCOUNTER — Encounter (HOSPITAL_BASED_OUTPATIENT_CLINIC_OR_DEPARTMENT_OTHER): Payer: Self-pay | Admitting: Cardiology

## 2022-12-27 DIAGNOSIS — I87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: Secondary | ICD-10-CM | POA: Diagnosis not present

## 2022-12-27 DIAGNOSIS — I872 Venous insufficiency (chronic) (peripheral): Secondary | ICD-10-CM | POA: Diagnosis not present

## 2022-12-27 DIAGNOSIS — I10 Essential (primary) hypertension: Secondary | ICD-10-CM | POA: Diagnosis not present

## 2022-12-27 DIAGNOSIS — L03115 Cellulitis of right lower limb: Secondary | ICD-10-CM | POA: Diagnosis not present

## 2022-12-27 DIAGNOSIS — L97822 Non-pressure chronic ulcer of other part of left lower leg with fat layer exposed: Secondary | ICD-10-CM

## 2022-12-28 ENCOUNTER — Telehealth: Payer: Self-pay | Admitting: Nurse Practitioner

## 2022-12-28 NOTE — Telephone Encounter (Signed)
I called Laura Santiago, Laura Santiago, message left that last in person visit visit Laura Santiago did not answer the door. Asked Laura Santiago to recontact with contact information as PC criteria has changed for home visits though with Dr Janese Banks following would still be able to follow, Laura Santiago would need to be agreeable and answer the door when providers come. Contact information provided.

## 2022-12-29 NOTE — Progress Notes (Signed)
SCHUYLER, BEHAN ([id number]) [id number] Page 1 of 11 Visit Report for 12/27/2022 Arrival Information Details Patient Name: Date of Service: Laura Ace. 12/27/2022 10:00 [place] Record Number: [id number] Patient Account Number: [id number] Date of Birth/Sex: Treating RN: [birth year] (77 y.o. Laura Santiago Primary Care Laura Santiago: [place] Other Clinician: Massie Santiago Referring Laura Santiago: Treating Laura Santiago/Extender: Laura Santiago Laura Santiago Weeks in Treatment: 86 Visit Information History Since Last Visit All ordered tests and consults were completed: No Patient Arrived: Laura Santiago Added or deleted any medications: No Arrival Time: 09:53 Any new allergies or adverse reactions: No Transfer Assistance: None Had a fall or experienced change in No Patient Identification Verified: Yes activities of daily living that may affect Secondary Verification Process Completed: Yes risk of falls: Patient Requires Transmission-Based No Signs or symptoms of abuse/neglect since last visito No Precautions: Hospitalized since last visit: No Patient Has Alerts: Yes Implantable device outside of the clinic excluding No Patient Alerts: PT HAS SERVICE cellular tissue based products placed in the center ANIMAL since last visit: ABI 07/11/21 Has Dressing in Place as Prescribed: Yes R) 1.16 L) 1.27 Has Compression in Place as Prescribed: Yes Pain Present Now: No Electronic Signature(s) Signed: 12/29/2022 9:50:53 AM By: Laura Santiago Entered By: Laura Santiago on 12/27/2022 10:01:33 -------------------------------------------------------------------------------- Clinic Level of Care Assessment Details Patient Name: Date of Service: Laura Ace. 12/27/2022 10:00 Laura Santiago Record Number: [id number] Patient Account Number: [id number] Date of Birth/Sex: Treating RN: [birth year] (77 y.o. Laura Santiago Primary Care Laura Santiago: Laura Santiago Other Clinician: Massie Santiago Referring Laura Santiago: Treating Laura Santiago/Extender: Laura Santiago Laura Santiago Weeks in Treatment: 62 Clinic Level of Care Assessment Items TOOL 1 Quantity Score '[]'$  - 0 Use when EandM and Procedure is performed on INITIAL visit ASSESSMENTS - Nursing Assessment / Reassessment '[]'$  - 0 General Physical Exam (combine w/ comprehensive assessment (listed just below) when performed on new pt. [address] CALVINA, LIPTAK ([id number]) [id number] Page 2 of 11 '[]'$  - 0 Comprehensive Assessment (HX, ROS, Risk Assessments, Wounds Hx, etc.) ASSESSMENTS - Wound and Skin Assessment / Reassessment '[]'$  - 0 Dermatologic / Skin Assessment (not related to wound area) ASSESSMENTS - Ostomy and/or Continence Assessment and Care '[]'$  - 0 Incontinence Assessment and Management '[]'$  - 0 Ostomy Care Assessment and Management (repouching, etc.) PROCESS - Coordination of Care '[]'$  - 0 Simple Patient / Family Education for ongoing care '[]'$  - 0 Complex (extensive) Patient / Family Education for ongoing care '[]'$  - 0 Staff obtains Programmer, systems, Records, T Results / Process Orders est '[]'$  - 0 Staff telephones HHA, Nursing Homes / Clarify orders / etc '[]'$  - 0 Routine Transfer to another Facility (non-emergent condition) '[]'$  - 0 Routine Hospital Admission (non-emergent condition) '[]'$  - 0 New Admissions / Biomedical engineer / Ordering NPWT Apligraf, etc. , '[]'$  - 0 Emergency Hospital Admission (emergent condition) PROCESS - Special Needs '[]'$  - 0 Pediatric / Minor Patient Management '[]'$  - 0 Isolation Patient Management '[]'$  - 0 Hearing / Language / Visual special needs '[]'$  - 0 Assessment of Community assistance (transportation, D/C planning, etc.) '[]'$  - 0 Additional assistance / Altered mentation '[]'$  - 0 Support Surface(s) Assessment (bed, cushion, seat, etc.) INTERVENTIONS - Miscellaneous '[]'$  - 0 External ear exam '[]'$  - 0 Patient Transfer  (multiple staff / Civil Service fast streamer / Similar devices) '[]'$  - 0 Simple Staple / Suture removal (25 or less) '[]'$  - 0 Complex Staple / Suture removal (26 or  more) '[]'$  - 0 Hypo/Hyperglycemic Management (do not check if billed separately) '[]'$  - 0 Ankle / Brachial Index (ABI) - do not check if billed separately Has the patient been seen at the hospital within the last three years: Yes Total Score: 0 Level Of Care: ____ Electronic Signature(s) Signed: 12/29/2022 9:50:53 AM By: Laura Santiago Entered By: Laura Santiago on 12/27/2022 10:41:26 -------------------------------------------------------------------------------- Compression Therapy Details Patient Name: Date of Service: Laura Jewett [name] 12/27/2022 10:00 [place] Record Number: [id number] Patient Account Number: [id number] Date of Birth/Sex: Treating RN: Santiago[birth year] (77 y.o. Laura Santiago Primary Care Laura Santiago: Laura Santiago Other Clinician: Rikita, Santiago ([id number]) [phone].pdf Page 3 of 11 Referring Elayah Klooster: Treating Quasean Frye/Extender: Laura Santiago Laura Santiago Weeks in Treatment: 77 Compression Therapy Performed for Wound Assessment: Wound #1 Left,Medial Lower Leg Performed By: Laura Santiago, Compression Type: Four Layer Pre Treatment ABI: 1.5 Santiago Procedure Diagnosis Same as Pre-procedure Electronic Signature(s) Signed: 12/29/2022 9:50:53 AM By: Laura Santiago Entered By: Laura Santiago on 12/27/2022 10:40:06 -------------------------------------------------------------------------------- Encounter Discharge Information Details Patient Name: Date of Service: Laura Santiago, Laura Rubenstein [name] 12/27/2022 10:00 [place] Record Number: [id number] Patient Account Number: [id number] Date of Birth/Sex: Treating RN: Laura Santiago (77 y.o. Laura Santiago Primary Care Genell Thede: Laura Santiago Other Clinician: Massie Santiago Referring Keyanah Kozicki: Treating  Laura Santiago/Extender: Laura Santiago Laura Santiago Weeks in Treatment: 24 Encounter Discharge Information Items Santiago Procedure Vitals Discharge Condition: Stable Temperature (F): 98.0 Ambulatory Status: Walker Pulse (bpm): 74 Discharge Destination: Home Respiratory Rate (breaths/min): 18 Transportation: Other Blood Pressure (mmHg): 133/79 Accompanied By: self Schedule Follow-up Appointment: Yes Clinical Summary of Care: Electronic Signature(s) Signed: 12/29/2022 9:50:53 AM By: Laura Santiago Entered By: Laura Santiago on 12/27/2022 10:59:33 -------------------------------------------------------------------------------- Lower Extremity Assessment Details Patient Name: Date of Service: Laura Jewett [name] 12/27/2022 10:00 A M Medical Record Number: [id number] Patient Account Number: [id number] Date of Birth/Sex: Treating RN: [birth year] (77 y.o. Laura Santiago Primary Care Neal Oshea: [place] Other Clinician: Massie Santiago Referring Joscelynn Brutus: Treating Gazella Anglin/Extender: Kalman Shan [facility] Laura Santiago Weeks in Treatment: [address] [place] ([id number]) [phone].pdf Page 4 of 11 Edema Assessment Assessed: [Left: Yes] [Right: Yes] Edema: [Left: Yes] [Right: Yes] Calf Left: Right: Point of Measurement: 35 cm From Medial Instep 36 cm 40 cm Ankle Left: Right: Point of Measurement: 12 cm From Medial Instep 21.5 cm 24.5 cm Vascular Assessment Pulses: Dorsalis Pedis Palpable: [Left:Yes] [Right:Yes] Electronic Signature(s) Signed: 12/27/2022 11:43:47 PM By: Gretta Cool, BSN, RN, CWS, [name] RN, BSN Signed: 12/29/2022 9:50:53 AM By: Laura Santiago Entered By: Laura Santiago on 12/27/2022 10:19:57 -------------------------------------------------------------------------------- Multi Wound Chart Details Patient Name: Date of Service: Laura Jewett [name] 12/27/2022 10:00 [place] Record Number: [id number] Patient Account Number:  [id number] Date of Birth/Sex: Treating RN: [birth year] (77 y.o. Laura Santiago Primary Care Maylyn Narvaiz: [place] Other Clinician: Massie Santiago Referring Zaccai Chavarin: Treating Ayonna Speranza/Extender: Laura Santiago Laura Santiago Weeks in Treatment: 64 Vital Signs Height(in): 66 Pulse(bpm): 74 Weight(lbs): 153 Blood Pressure(mmHg): 133/79 Body Mass Index(BMI): 24.7 Temperature(F): 98.0 Respiratory Rate(breaths/min): 18 [1:Photos:] [N/A:N/A] Left, Medial Lower Leg Left, Lateral Lower Leg N/A Wound Location: Gradually Appeared Pressure Injury N/A Wounding Event: Venous Leg Ulcer Venous Leg Ulcer N/A Primary Etiology: Hypertension, Osteoarthritis, ReceivedHypertension, Osteoarthritis, ReceivedN/A Comorbid History: Chemotherapy, Received Radiation Chemotherapy, Received Radiation 04/06/2021 10/18/2022 N/A Date Acquired: 46 10 N/A Weeks of Treatment: Open Open N/A Wound Status: IRINA, OKELLY ([id number])  [id number] Page 5 of 11 No No N/A Wound Recurrence: Yes No N/A Clustered Wound: 0.2x0.3x0.1 0.1x0.1x0.1 N/A Measurements L x W x D (cm) 0.047 0.008 N/A A (cm) : rea 0.005 0.001 N/A Volume (cm) : 99.90% 99.80% N/A % Reduction in Area: 100.00% 99.90% N/A % Reduction in Volume: Full Thickness Without Exposed Full Thickness Without Exposed N/A Classification: Support Structures Support Structures Medium Medium N/A Exudate Amount: Serosanguineous Serosanguineous N/A Exudate Type: red, brown red, brown N/A Exudate Color: Flat and Intact N/A N/A Wound Margin: Medium (34-66%) Medium (34-66%) N/A Granulation Amount: Red, Pink Pink N/A Granulation Quality: Medium (34-66%) Medium (34-66%) N/A Necrotic Amount: Fat Layer (Subcutaneous Tissue): Yes Fat Layer (Subcutaneous Tissue): Yes N/A Exposed Structures: Fascia: No Fascia: No Tendon: No Tendon: No Muscle: No Muscle: No Joint: No Joint: No Bone: No Bone: No Small  (1-33%) None N/A Epithelialization: Treatment Notes Electronic Signature(s) Signed: 12/29/2022 9:50:53 AM By: Laura Santiago Entered By: Laura Santiago on 12/27/2022 10:20:03 -------------------------------------------------------------------------------- Multi-Disciplinary Care Plan Details Patient Name: Date of Service: Laura Santiago, Pablo. 12/27/2022 10:00 A M Medical Record Number: [id number] Patient Account Number: [id number] Date of Birth/Sex: Treating RN: [birth year] (77 y.o. Laura Santiago Primary Care Vaughan Garfinkle: [place] Other Clinician: Massie Santiago Referring Dhruv Christina: Treating Jamar Casagrande/Extender: Laura Santiago Laura Santiago Weeks in Treatment: 46 Active Inactive Necrotic Tissue Nursing Diagnoses: Impaired tissue integrity related to necrotic/devitalized tissue Knowledge deficit related to management of necrotic/devitalized tissue Goals: Necrotic/devitalized tissue will be minimized in the wound bed Date Initiated: 07/06/2021 Target Resolution Date: 12/13/2022 Goal Status: Active Patient/caregiver will verbalize understanding of reason and process for debridement of necrotic tissue Date Initiated: 07/06/2021 Date Inactivated: 10/05/2021 Target Resolution Date: 07/06/2021 Goal Status: Met Interventions: Assess patient pain level pre-, during and Santiago procedure and prior to discharge Provide education on necrotic tissue and debridement process Treatment Activities: Apply topical anesthetic as ordered : 07/06/2021 [name] ([id number]) [id number] Page 6 of 11 Biologic debridement : 07/06/2021 Enzymatic debridement : 07/06/2021 Excisional debridement : 07/06/2021 Notes: Soft Tissue Infection Nursing Diagnoses: Impaired tissue integrity Potential for infection: soft tissue Goals: Patient's soft tissue infection will resolve Date Initiated: 03/15/2022 Target Resolution Date: 12/13/2022 Goal Status: Active Signs and  symptoms of infection will be recognized early to allow for prompt treatment Date Initiated: 03/15/2022 Target Resolution Date: 12/13/2022 Goal Status: Active Interventions: Assess signs and symptoms of infection every visit Treatment Activities: Culture and sensitivity : 03/15/2022 Notes: Electronic Signature(s) Signed: 12/27/2022 11:43:47 PM By: Gretta Cool, BSN, RN, CWS, [name] RN, BSN Signed: 12/29/2022 9:50:53 AM By: Laura Santiago Entered By: Laura Santiago on 12/27/2022 10:57:41 -------------------------------------------------------------------------------- Pain Assessment Details Patient Name: Date of Service: Laura Jewett [name] 12/27/2022 10:00 A M Medical Record Number: [id number] Patient Account Number: [id number] Date of Birth/Sex: Treating RN: [birth year] (77 y.o. Laura Santiago Primary Care Kaleth Koy: [place] Other Clinician: Massie Santiago Referring Michale Weikel: Treating Dayjah Selman/Extender: Kalman Shan [facility] Laura Blanchard in Treatment: 2 Active Problems Location of Pain Severity and Description of Pain Patient Has Paino No Site Locations [place], [place] ([id number]) [phone].pdf Page 7 of 11 Pain Management and Medication Current Pain Management: Electronic Signature(s) Signed: 12/27/2022 11:43:47 PM By: Gretta Cool, BSN, RN, CWS, [name] RN, BSN Signed: 12/29/2022 9:50:53 AM By: Laura Santiago Entered By: Laura Santiago on 12/27/2022 10:15:17 -------------------------------------------------------------------------------- Patient/Caregiver Education Details Patient Name: Date of Service: Laura Jewett [name] 1/17/2024andnbsp10:00 [place] Record Number: [id number] Patient Account Number: [id number] Date of Birth/Gender:  Treating RN: Santiago[birth year] (77 y.o. Laura Santiago Primary Care Physician: [place] Other Clinician: Massie Santiago Referring Physician: Treating Physician/Extender: Hoffman, [place] Weeks in Treatment: 63 Education Assessment Education Provided To: Patient Education Topics Provided Wound/Skin Impairment: Handouts: Other: Continue wound care as directed Methods: Explain/Verbal Responses: State content correctly Electronic Signature(s) Signed: 12/29/2022 9:50:53 AM By: Laura Santiago Entered By: Laura Santiago on 12/27/2022 10:57:35 [name] ([id number]) [id number] Page 8 of 11 -------------------------------------------------------------------------------- Wound Assessment Details Patient Name: Date of Service: Laura Ace. 12/27/2022 10:00 A M Medical Record Number: [id number] Patient Account Number: [id number] Date of Birth/Sex: Treating RN: [birth year] (77 y.o. Laura Santiago Primary Care Darren Caldron: [place] Other Clinician: Massie Santiago Referring Jaci Desanto: Treating Kailynn Satterly/Extender: Laura Santiago Laura Santiago Weeks in Treatment: 88 Wound Status Wound Number: 1 Primary Venous Leg Ulcer Etiology: Wound Location: Left, Medial Lower Leg Wound Status: Open Wounding Event: Gradually Appeared Comorbid Hypertension, Osteoarthritis, Received Chemotherapy, Date Acquired: 04/06/2021 History: Received Radiation Weeks Of Treatment: 77 Clustered Wound: Yes Photos Wound Measurements Length: (cm) 0.2 Width: (cm) 0.3 Depth: (cm) 0.1 Area: (cm) 0.047 Volume: (cm) 0.005 % Reduction in Area: 99.9% % Reduction in Volume: 100% Epithelialization: Small (1-33%) Wound Description Classification: Full Thickness Without Exposed Support Structures Wound Margin: Flat and Intact Exudate Amount: Medium Exudate Type: Serosanguineous Exudate Color: red, brown Foul Odor After Cleansing: No Slough/Fibrino Yes Wound Bed Granulation Amount: Medium (34-66%) Exposed Structure Granulation Quality: Red, Pink Fascia Exposed: No Necrotic Amount: Medium (34-66%) Fat Layer (Subcutaneous Tissue) Exposed:  Yes Necrotic Quality: Adherent Slough Tendon Exposed: No Muscle Exposed: No Joint Exposed: No Bone Exposed: No Treatment Notes Wound #1 (Lower Leg) Wound Laterality: Left, Medial Cleanser Wound Cleanser Discharge Instruction: Wash your hands with soap and water. Remove old dressing, discard into plastic bag and place into trash. Cleanse the wound with Wound Cleanser prior to applying a clean dressing using gauze sponges, not tissues or cotton balls. Do not scrub or use excessive CAMIKA, MARSICO ([id number]) [id number] Page 9 of 11 force. Pat dry using gauze sponges, not tissue or cotton balls. Peri-Wound Care Topical Triamcinolone Acetonide Cream, 0.1%, 15 (g) tube Discharge Instruction: lower leg Primary Dressing AquacelAg Advantage Dressing, 2X2 (in/in) Discharge Instruction: Apply to wound as directed Secondary Dressing Zetuvit Plus 4x8 (in/in) Secured With Compression Wrap 3-LAYER WRAP - Profore Lite LF 3 Multilayer Compression Bandaging System Discharge Instruction: Apply 3 multi-layer wrap as prescribed. Compression Stockings Add-Ons Electronic Signature(s) Signed: 12/27/2022 11:43:47 PM By: Gretta Cool, BSN, RN, CWS, [name] RN, BSN Signed: 12/29/2022 9:50:53 AM By: Laura Santiago Entered By: Laura Santiago on 12/27/2022 10:17:21 -------------------------------------------------------------------------------- Wound Assessment Details Patient Name: Date of Service: Laura Jewett [name] 12/27/2022 10:00 A M Medical Record Number: [id number] Patient Account Number: [id number] Date of Birth/Sex: Treating RN: [birth year]-S[birth year] (77 y.o. Laura Santiago Primary Care Paytience Bures: [place] Other Clinician: Massie Santiago Referring Marimar Suber: Treating Lalani Winkles/Extender: Laura Santiago Laura Santiago Weeks in Treatment: 59 Wound Status Wound Number: 8 Primary Venous Leg Ulcer Etiology: Wound Location: Left, Lateral Lower Leg Wound Status:  Open Wounding Event: Pressure Injury Comorbid Hypertension, Osteoarthritis, Received Chemotherapy, Date Acquired: 10/18/2022 History: Received Radiation Weeks Of Treatment: 10 Clustered Wound: No Photos Wound Measurements KYMORA, SCIARA M ([id number]) Length: (cm) 0.1 Width: (cm) 0.1 Depth: (cm) 0.1 Area: (cm) 0.008 Volume: (cm) 0.001 [id number] Page 10 of 11 % Reduction in Area: 99.8% % Reduction in Volume: 99.9% Epithelialization: None  Wound Description Classification: Full Thickness Without Exposed Support Structures Exudate Amount: Medium Exudate Type: Serosanguineous Exudate Color: red, brown Foul Odor After Cleansing: No Slough/Fibrino Yes Wound Bed Granulation Amount: Medium (34-66%) Exposed Structure Granulation Quality: Pink Fascia Exposed: No Necrotic Amount: Medium (34-66%) Fat Layer (Subcutaneous Tissue) Exposed: Yes Necrotic Quality: Adherent Slough Tendon Exposed: No Muscle Exposed: No Joint Exposed: No Bone Exposed: No Treatment Notes Wound #8 (Lower Leg) Wound Laterality: Left, Lateral Cleanser Wound Cleanser Discharge Instruction: Wash your hands with soap and water. Remove old dressing, discard into plastic bag and place into trash. Cleanse the wound with Wound Cleanser prior to applying a clean dressing using gauze sponges, not tissues or cotton balls. Do not scrub or use excessive force. Pat dry using gauze sponges, not tissue or cotton balls. Peri-Wound Care Topical Triamcinolone Acetonide Cream, 0.1%, 15 (g) tube Discharge Instruction: Mix 1:1 with Nystatin cream and apply to periwound and leg Primary Dressing AquacelAg Advantage Dressing, 2X2 (in/in) Discharge Instruction: Apply to wound as directed Secondary Dressing Zetuvit Plus 4x8 (in/in) Secured With Compression Wrap 3-LAYER WRAP - Profore Lite LF 3 Multilayer Compression Bandaging System Discharge Instruction: Apply 3 multi-layer wrap as  prescribed. Compression Stockings Add-Ons Electronic Signature(s) Signed: 12/27/2022 11:43:47 PM By: Gretta Cool, BSN, RN, CWS, [name] RN, BSN Signed: 12/29/2022 9:50:53 AM By: Laura Santiago Entered By: Laura Santiago on 12/27/2022 10:18:13 -------------------------------------------------------------------------------- Vitals Details Patient Name: Date of Service: Laura Santiago, BO [name] 12/27/2022 10:00 [place] Record Number: [id number] Patient Account Number: [id number] Laura Santiago, Laura Santiago ([id number]) [id number] Page 11 of 11 Date of Birth/Sex: Treating RN: Santiago[birth year] (77 y.o. Laura Santiago Primary Care Freyja Govea: Other Clinician: [place] Referring Jyasia Markoff: Treating Nitin Mckowen/Extender: Laura Santiago Laura Santiago Weeks in Treatment: 17 Vital Signs Time Taken: 10:02 Temperature (F): 98.0 Height (in): 66 Pulse (bpm): 74 Weight (lbs): 153 Respiratory Rate (breaths/min): 18 Body Mass Index (BMI): 24.7 Blood Pressure (mmHg): 133/79 Reference Range: 80 - 120 mg / dl Electronic Signature(s) Signed: 12/29/2022 9:50:53 AM By: Laura Santiago Entered By: Laura Santiago on 12/27/2022 10:04:33

## 2022-12-29 NOTE — Progress Notes (Signed)
AERIKA, GROLL ([id number]) [id number] Page 1 of 14 Visit Report for 12/27/2022 Chief Complaint Document Details Patient Name: Date of Service: Laura Ace. 12/27/2022 10:00 [place] Record Number: [id number] Patient Account Number: [id number] Date of Birth/Sex: Treating RN: 1946/10/02 (77 y.o. [name] Primary Care Provider: [place] Other Clinician: [name] Referring Provider: Treating Provider/Extender: [name] Weeks in Treatment: 44 Information Obtained from: Patient Chief Complaint Left lower extremity wound Right toe wounds Left upper lateral thigh wounds Electronic Signature(s) Signed: 12/27/2022 12:38:47 PM By: Kalman Shan DO Entered By: Kalman Shan on 12/27/2022 10:39:18 -------------------------------------------------------------------------------- Debridement Details Patient Name: Date of Service: Laura Jewett [name] 12/27/2022 10:00 [place] Record Number: [id number] Patient Account Number: [id number] Date of Birth/Sex: Treating RN: Oct 11, 1946 (77 y.o. [name] Primary Care Provider: [place] Other Clinician: [name] Referring Provider: Treating Provider/Extender: [name] Weeks in Treatment: 77 Debridement Performed for Assessment: Wound #8 Left,Lateral Lower Leg Performed By: Physician Kalman Shan, MD Debridement Type: Debridement Severity of Tissue Pre Debridement: Fat layer exposed Level of Consciousness (Pre-procedure): Awake and Alert Pre-procedure Verification/Time Out Yes - 10:33 Taken: Start Time: 10:33 T Area Debrided (L x W): otal 1 (cm) x 1 (cm) = 1 (cm) Tissue and other material debrided: Viable, Non-Viable, Slough, Subcutaneous, Skin: Dermis , Skin: Epidermis, Slough Level: Skin/Subcutaneous Tissue Debridement Description: Excisional Instrument: Curette Bleeding:  Minimum Hemostasis Achieved: Pressure Response to Treatment: Procedure was tolerated well Level of Consciousness (Santiago- Awake and Alert Laura Santiago, Laura Santiago ([id number]) [id number] Page 2 of 14 Awake and Alert procedure): Santiago Debridement Measurements of Total Wound Length: (cm) 0.4 Width: (cm) 0.3 Depth: (cm) 0.1 Volume: (cm) 0.009 Character of Wound/Ulcer Santiago Debridement: Stable Severity of Tissue Santiago Debridement: Fat layer exposed Santiago Procedure Diagnosis Same as Pre-procedure Electronic Signature(s) Signed: 12/27/2022 12:38:47 PM By: Kalman Shan DO Signed: 12/27/2022 11:43:47 PM By: Gretta Cool, BSN, RN, CWS, [name] RN, BSN Signed: 12/29/2022 9:50:53 AM By: [name] Entered By: [name] on 12/27/2022 10:38:51 -------------------------------------------------------------------------------- Debridement Details Patient Name: Date of Service: Laura Jewett [name] 12/27/2022 10:00 A M Medical Record Number: [id number] Patient Account Number: [id number] Date of Birth/Sex: Treating RN: 1946/10/12 (77 y.o. [name] Primary Care Provider: [place] Other Clinician: [name] Referring Provider: Treating Provider/Extender: [name] Weeks in Treatment: 77 Debridement Performed for Assessment: Wound #1 Left,Medial Lower Leg Performed By: Physician Kalman Shan, MD Debridement Type: Debridement Severity of Tissue Pre Debridement: Fat layer exposed Level of Consciousness (Pre-procedure): Awake and Alert Pre-procedure Verification/Time Out Yes - 10:30 Taken: Start Time: 10:30 T Area Debrided (L x W): otal 4 (cm) x 3 (cm) = 12 (cm) Tissue and other material debrided: Viable, Non-Viable, Slough, Subcutaneous, Skin: Dermis , Skin: Epidermis, Slough Level: Skin/Subcutaneous Tissue Debridement Description: Excisional Instrument: Curette Bleeding: Minimum Hemostasis Achieved: Pressure Response to  Treatment: Procedure was tolerated well Level of Consciousness (Santiago- Awake and Alert procedure): Santiago Debridement Measurements of Total Wound Length: (cm) 5.5 Width: (cm) 2 Depth: (cm) 0.1 Volume: (cm) 0.864 Character of Wound/Ulcer Santiago Debridement: Stable Severity of Tissue Santiago Debridement: Fat layer exposed Santiago Procedure Diagnosis Same as Pre-procedure Electronic Signature(s) Signed: 12/27/2022 12:38:47 PM By: Kalman Shan DO Signed: 12/27/2022 11:43:47 PM By: Gretta Cool, BSN, RN, CWS, [name] RN, BSN [place], Creighton M ([id number]) [phone].pdf Page 3 of 14 Signed: 12/29/2022 9:50:53 AM By: [name] Entered By:  [name] on 12/27/2022 10:39:34 -------------------------------------------------------------------------------- HPI Details Patient Name: Date of Service: Laura Ace. 12/27/2022 10:00 [place] Record Number: [id number] Patient Account Number: [id number] Date of Birth/Sex: Treating RN: 10-12-1946 (76 y.o. [name] Primary Care Provider: [place] Other Clinician: [name] Referring Provider: Treating Provider/Extender: Kalman Shan [facility] Weeks in Treatment: 73 History of Present Illness HPI Description: Admission 7/27 Ms. Laura Santiago is a 77 year old female with a past medical history of ADHD, metastatic breast cancer, stage IV chronic kidney disease, history of DVT on Xarelto and chronic venous insufficiency that presents to the clinic for a chronic left lower extremity wound. She recently moved to [facility] 4 days ago. She was being followed by wound care center in [name]. She reports a 10-year history of wounds to her left lower extremity that eventually do heal with debridement and compression therapy. She states that the current wound reopened 4 months ago and she is using Vaseline and Coban. She denies signs of infection. 8/3; patient presents for 1 week  follow-up. She reports no issues or complaints today. She states she had vascular studies done in the last week. She denies signs of infection. She brought her little service dog with her today. 8/17; patient presents for follow-up. She has missed her last clinic appointment. She states she took the wrap off and attempted to rewrap her leg. She is having difficulty with transportation. She has her service dog with her today. Overall she feels well and reports improvement in wound healing. She denies signs of infection. She reports owning an old Velcro wrap compression and has this at her living facility 9/14; patient presents for follow-up. Patient states that over the past 2 to 3 weeks she developed toe wounds to her right foot. She attributes this to tight fitting shoes. She subsequently developed cellulitis in the right leg and has been treated by doxycycline by her oncologist. She reports improvement in symptoms however continues to have some redness and swelling to this leg. T the left lower extremity patient has been having her wraps changed with home health twice weekly. She states that the [facility] is not helping control o the drainage. Other than that she has no issues or complaints today. She denies signs of infection to the left lower extremity. 9/21; patient presents for follow-up. She reports seeing infectious disease for her cellulitis. She reports no further management. She has home health that changes the wraps twice weekly. She has no issues or complaints today. She denies signs of infection. 10/5; patient presents for follow-up. She has no issues or complaints today. She denies signs of infection. She states that the right great toe has not been dressed by home health. 10/12; patient presents for follow-up. She has no issues or complaints today. She reports improvement in her wound healing. She has been using silver alginate to the right great toe wound. She denies signs of  infection. 10/26; patient presents for follow-up. Home health did not have sorbact so they continued to use Hydrofera Blue under the wrap. She has been using silver alginate to the great toe wound however she did not have a dressing in place today. She currently denies signs of infection. 11/2; patient presents for follow-up. She has been using sorb act under the compression wrap. She reports using silver alginate to the toe wound again she does not have a dressing in place. She currently denies signs of infection. 11/23; patient presents  for follow-up. Unfortunately she has missed her last 2 clinic appointments. She was last seen 3 weeks ago. She did her own compression wrap with Kerlix and Coban yesterday after seeing vein and vascular. She has not been dressing her right great toe wound. She currently denies signs of infection. 11/30; patient presents for 1 week follow-up. She states she changed her dressing last week prior to home health and use sorb act with Dakin's and Hydrofera Blue. Home health has changed the dressing as well and they have been using sorbact. T oday she reports increased redness to her right lower extremity. She has a history of cellulitis to this leg. She has been using silver alginate to the right great toe. Unfortunately she had an episode of diarrhea prior to coming in and had feces all over the right leg and to the wrap of her left leg. 12/7; patient presents for 1 week follow-up. She states that home health did not come out to change the dressing and she took it off yesterday. It is unclear if she is dressing the right toe wound. She denies signs of infection. 12/14; patient presents for 1 week follow-up. She has no issues or complaints today. 12/21; patient presents for follow-up. She has no issues or complaints today. She denies signs of infection. 12/28/2021; patient presents for follow-up. She was hospitalized for sepsis secondary to right lower extremity cellulitis  On 12/23. She states she is currently at a SNF. She states that she was started on doxycycline this morning for her right great toe swelling and redness. She is not sure what dressings have been done to her left lower extremity for the past 3 weeks. She says its been mainly gauze with an Ace wrap. 1/25; patient presents for follow-up. She is still residing in a skilled nursing facility. She reports mild pain to the left lower extremity wound bed. She states she is going to see a podiatrist soon. Laura, Santiago ([id number]) [id number] Page 4 of 14 2/8; patient presents for follow-up. She has moved back to her residential community from her skilled nursing facility. She has no issues or complaints today. She denies signs of systemic infections. 2/15; patient presents for follow-up. He has no issues or complaints today. She denies systemic signs of infection. 2/22; patient presents for follow-up. She has no issues or complaints today. She denies signs of infection. 3/1; patient presents for follow-up. She states that home health came out the day after she was seen in our clinic and yesterday to do the wrap change. She denies signs of infection. She reports excoriated skin on the ankle. 3/8; patient presents for follow-up. She has no issues or complaints today. She denies signs of infection. 3/15; patient presents for follow-up. Home health has been coming out to change the dressings. She reports more tenderness to the wound site. She denies purulent drainage, increased warmth or erythema to the area. 4/5; patient presents for follow-up. She has missed her last 2 clinic appointments. I have not seen her in 3 weeks. She was recently hospitalized for altered mental status. She was involuntarily committed. She was evaluated by psychiatry and deemed to have competency. There was no specific cause of her altered mental status. It was concluded that her physical and mental  health were declining due to her chronic medical conditions. Currently home health has been coming out for dressing changes. Patient has also been doing her own dressing changes. She reports more skin breakdown to the periwound and now has a  new wound. She denies fever/chills. She reports continued tenderness to the wound site. 4/12; patient with significant venous insufficiency and a large wound on her left lower leg taking up about 80% of the circumference of her lower leg. Cultures of this grew MRSA and Pseudomonas. She had completed a course of ciprofloxacin now is starting doxycycline. She has been using Dakin's wet-to-dry and a Tubigrip. She has home health twice a week and we change it once. 4/19; patient presents for follow-up. She completed her course of doxycycline. She has been using Dakin's wet-to-dry dressing and Tubigrip. Home health changes the dressing twice weekly. Currently she has no issues or complaints. 4/26; patient presents for follow-up. At last clinic visit orders for home health were Iodosorb under compression therapy. Unfortunately they did not have the dressing and have been using Dakin's and gentamicin under the wrap. Patient currently denies signs of infection. She has no issues or complaints today. 5/3; patient presents for follow-up. Again Iodosorb has not been used under the compression therapy when home health comes out to change the wrap and dressing. They have been using Sorbact. It is unclear why this is happening since we send orders weekly to the agency. She denies signs of infection. Patient has not purchased the [employer] antibiotics. We reached out to the company and they said they have been trying to contact her on a regular basis. We gave the patient the number to call to order the medication. 5/10; patient presents for follow-up. She has no issues or complaints today. Again home health has not been using Iodosorb. Mepilex was on the wound bed. No other  dressings noted. She brought in her Keystone antibiotics. She denies signs of infection. 5/17; patient presents for follow-up. Home health has come out twice since she was last seen. Joint well she has been using Keystone antibiotic with Sorbact under the compression wrap. She has no issues or complaints today. She denies signs of infection. 5/24; patient presents for follow-up. We have been using Keystone antibiotics with Sorbact under compression therapy. She is tolerating the treatment well. She is reporting improvement in wound healing. She denies signs of infection. 5/31; patient presents for follow-up. We continue to do [facility] antibiotics with Sorbact under compression therapy. She continues to report improvement in wound healing. Home health comes out and changes the dressing once weekly. 05-17-2022 upon evaluation today patient appears to be doing better in regard to her wound especially compared to the last time I saw her. Fortunately I do think that she is seeing improvements. With that being said I do believe that she may be benefit from sharp debridement today to clear away some of the necrotic debris I discussed that with her as well. She is an amendable to that plan. Otherwise she is very pleased with how the [employer] is doing for her. 6/14; patient presents for follow-up. We have been using Keystone antibiotic with Sorbact and absorbent dressings under 3 layer compression. She has no issues or complaints today. She reports improvement in wound healing. She denies signs of infection. 6/21; patient presents for follow-up. We are continuing with [facility] antibiotic and Sorbact under 3 layer compression. Patient has no complaints. Continued wound healing is happening. She denies signs of infection. 6/28; patient presents for follow-up. We have been using Keystone antibiotic with Sorbact under 3 layer compression. Usually home health comes out and changes the dressing twice a week.  Unfortunately they did not go out to change the dressing. It is unclear why. Patient  did not call them. She currently denies signs of infection. 7/5; patient presents for follow-up. We have been using Keystone antibiotic with calcium alginate under 3 layer compression. She reports improvement in wound healing. She denies signs of infection. Home health has come out to do dressing changes twice this past week. 7/12; patient presents for follow-up. We have been using Keystone antibiotic with calcium alginate under 3 layer compression. Patient states that home health came out once last week to change the dressing. She reports improvement in wound healing. She currently denies signs of infection. 7/19; patient presents for follow-up. We have been using Keystone antibiotic with calcium alginate under 3 layer compression. Home health came out once last week to change the dressing. She has no issues or complaints today. She denies signs of infection. 8/2; patient presents for follow-up. We have been using Keystone antibiotic with calcium alginate under 3 layer compression. Unfortunately she missed her appointment last week and home health did not come out to do dressing changes. Patient currently denies signs of infection. 8/9; patient presents for follow-up. We have been using Keystone with calcium alginate under 3 layer compression. She states that home health came out once last week. She currently denies signs of infection. Her wrap was completely wet. She states she was cleaning the top of the leg and water soaked down into the wrap. 8/16; patient presents for follow-up. We have been using Keystone with calcium alginate under 3 layer compression. She states that home health came out twice last week. She has no issues or complaints today. 8/23; patient presents for follow-up. He has been using Keystone with calcium alginate under 3 layer compression. Home health came out twice last week. She denies signs  of infection. 8/30; patient presents for follow-up. We have been using Keystone with calcium alginate under 3 layer compression. Home health came out once last week to change the dressing. Patient reports improvement in wound healing. She states she is almost done with her chemotherapy infusions and has 1 more left. 9/13; patient presents for follow-up. She has lost the capsules to her [facility] antibiotic which I believe is the vancomycin pills. She has her Zosyn powder today. We have been using Keystone antibiotic ointment with calcium alginate under 3 layer compression. She is concerned about systemic infection however her vitals are stable and there is no surrounding soft tissue infection. She would like to remain a patient in our wound care center however would like a second opinion for her wound care at another facility. She asked to be referred to [facility] wound care center. TINSLEY, EVERMAN ([id number]) [id number] Page 5 of 14 9/20; patient presents for follow-up. She found her vancomycin capsules and brought in her complete Keystone antibiotic ointment set today. Unfortunately she has developed skin breakdown and Erythema to the right lower extremity With increased swelling. She states she went to a pow wow Over the weekend and was on her feet for extended periods of time. She saw her oncologist yesterday who prescribed her doxycycline for her right lower extremity erythema. 9/27; patient presents for follow-up. We have been using Keystone antibiotic with Aquacel under 3 layer compression to the lower extremities bilaterally. When home health came and changed the wrap she secretly put coffee into the spray mix along with [facility] antibiotic on her leg thinking the acidic component would better activate the zoysn (sonething she discussed with her microbiologist brother). She has reported improvement in wound healing. 10/4; patient presents for follow-up. She has no  issues or complaints today. We have been doing Aquacel and keystone under 3 layer compression to the lower extremities bilaterally. This morning she took the right lower extremity wrap off as it was uncomfortable. She has no open wounds to this leg. 10/11; patient presents for follow-up. We have been doing Aquacel with Keystone antibiotic ointment under 3 layer compression to the left lower extremity. She developed a small blister to the anterior aspect of the left leg noticed when the wrap was taken off on intake. She currently denies signs of infection. 10/18; patient presents for follow-up. We have been doing Aquacel with Keystone antibiotic ointment under 3 layer compression to the left lower extremity. There has been continued improvement in wound healing. She denies signs of infection. 10/25; patient arrives for treatment of venous insufficiency ulcers on her left lower leg both lateral and medial are remanence of apparently a circumferential wound. Much improved. We are using topicals Keystone and Aquacel Ag under 3 layer compression we continue to make good progress. The patient talk to me at some length with regards to different things she has on her forehead and her Peri orbital area for which she is apparently applying [place]. She feels that what ever we are treating on her wounds is a more systemic problem. I really was not able to get a handle on what she is talking about however I did caution her not to put the [place] in her eyes. 11/1; her wounds continue to improve she is using Keystone and Aquacel Ag G under 3 layer compression. Our intake nurse notes erythema and edema in the right leg. The patient has a litany of concerns with regards to a rash on her forehead or ears and other systemic complaints. She has an appointment with dermatology on November 11 11/8; patient presents for follow-up. We have been using Keystone and Aquacel under 4-layer compression. She has no issues or  complaints today. She reports improvement in wound healing. 11/15; patient presents for follow-up. We have been using Aquacel with Keystone antibiotic under 3 layer compression. Patient continues improvement in wound healing. 12/6; patient presents for follow-up. We have been using Aquacel with Keystone antibiotic ointment under 3 layer compression. Wounds appear well-healing. 12/13; patient presents for follow-up. We have been using Aquacel with Keystone antibiotic under 3 layer compression. She has no issues or complaints today. 12/27 left lateral medial ankle. Superficial wounds remain there is significantly improved we are using Keystone backed with Zetuvit under 4-layer compression 1/3; patient presents for follow-up. Her wounds appear well-healing. We have been using Aquacel Ag with Keystone antibiotic ointment under compression therapy. This should be a 3 layer compression. 1/17; patient presents for follow-up. Her wounds on her left lower extremity are well-healing. We are using Aquacel Ag and Keystone antibiotic ointment under compression therapy. She missed her last clinic appointment. She states that the wrap has been changed twice weekly since she was last seen. Unfortunately she has developed increased warmth and redness to the right lower extremity consistent with cellulitis. She states this started a few days ago. Electronic Signature(s) Signed: 12/27/2022 12:38:47 PM By: Kalman Shan DO Entered By: Kalman Shan on 12/27/2022 10:40:16 -------------------------------------------------------------------------------- Physical Exam Details Patient Name: Date of Service: Laura Jewett [name] 12/27/2022 10:00 A M Medical Record Number: [id number] Patient Account Number: [id number] Date of Birth/Sex: Treating RN: 09-04-1946 (77 y.o. [name] Primary Care Provider: [place] Other Clinician: [name] Referring Provider: Treating Provider/Extender: [name] Weeks in Treatment: 63 Constitutional . Cardiovascular . Psychiatric . Laura Santiago, Laura Santiago ([id number]) [id number] Page 6 of 14 Notes T the left lower extremity there are areas with nonviable tissue. Postdebridement there are a few small scattered wounds to the lateral and medial aspect with o granulation tissue. No signs of infection to the sites. T the right lower extremity there is increased warmth and erythema throughout the leg that stops below o the knee. No purulent drainage. Electronic Signature(s) Signed: 12/27/2022 12:38:47 PM By: Kalman Shan DO Entered By: Kalman Shan on 12/27/2022 10:41:38 -------------------------------------------------------------------------------- Physician Orders Details Patient Name: Date of Service: Laura Jewett [name] 12/27/2022 10:00 [place] Record Number: [id number] Patient Account Number: [id number] Date of Birth/Sex: Treating RN: Aug 19, 1946 (77 y.o. [name] Primary Care Provider: [place] Other Clinician: [name] Referring Provider: Treating Provider/Extender: [name] Weeks in Treatment: 86 Verbal / Phone Orders: No Diagnosis Coding ICD-10 Coding Code Description [phone] 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 I87.311 Chronic venous hypertension (idiopathic) with ulcer of right lower extremity I87.2 Venous insufficiency (chronic) (peripheral) Z79.01 Long term (current) use of anticoagulants I10 Essential (primary) hypertension 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 Follow-up Appointments Return Appointment in 1 week. Nurse Visit as needed [place]: Rocky Morel [phone] [place] for wound care. May utilize formulary equivalent dressing for  wound treatment orders unless otherwise specified. Home Health Nurse may visit PRN to address patients wound care needs. - home health to see patient 2 times per week, patient to be seen at wound clinic once per week **Please direct any NON-WOUND related issues/requests for orders to patient's Primary Care Physician. **If current dressing causes regression in wound condition, may D/C ordered dressing product/s and apply Normal Saline Moist Dressing daily until next [place] or Other MD appointment. **Notify [facility] of regression in wound condition at [phone]. Other Home Health Orders/Instructions: Bathing/ Shower/ Hygiene May shower with wound dressing protected with water repellent cover or cast protector. No tub bath. Anesthetic (Use 'Patient Medications' Section for Anesthetic Order Entry) Lidocaine applied to wound bed Edema Control - Lymphedema / Segmental Compressive Device / Other Optional: One layer of unna paste to top of compression wrap (to act as an anchor). Elevate, Exercise Daily and A void Standing for Long Periods of Time. Elevate legs to the level of the heart and pump ankles as often as possible Elevate leg(s) parallel to the floor when sitting. DO YOUR BEST to sleep in the bed at night. DO NOT sleep in your recliner. Long hours of sitting in a recliner leads to swelling of the legs and/or potential wounds on your backside. Additional Orders / Instructions REIS, PIENTA ([id number]) [id number] Page 7 of 14 Follow Nutritious Diet and Increase Protein Intake Medications-Please add to medication list. Keystone Compound ntibiotics - start Keflex for right leg cellulitis P.O. A Wound Treatment Wound #1 - Lower Leg Wound Laterality: Left, Medial Cleanser: Wound Cleanser 3 x Per Week/30 Days Discharge Instructions: Wash your hands with soap and water. Remove old dressing, discard into plastic bag and place into  trash. Cleanse the wound with Wound Cleanser prior to applying a clean dressing using gauze sponges, not tissues or cotton balls. Do not scrub or use excessive force. Pat dry using gauze sponges, not tissue or cotton balls.  Topical: Triamcinolone Acetonide Cream, 0.1%, 15 (g) tube 3 x Per Week/30 Days Discharge Instructions: lower leg Prim Dressing: AquacelAg Advantage Dressing, 2X2 (in/in) 3 x Per Week/30 Days ary Discharge Instructions: Apply to wound as directed Secondary Dressing: Zetuvit Plus 4x8 (in/in) 3 x Per Week/30 Days Compression Wrap: 3-LAYER WRAP - Profore Lite LF 3 Multilayer Compression Bandaging System 3 x Per Week/30 Days Discharge Instructions: Apply 3 multi-layer wrap as prescribed. Wound #8 - Lower Leg Wound Laterality: Left, Lateral Cleanser: Wound Cleanser 3 x Per Week/30 Days Discharge Instructions: Wash your hands with soap and water. Remove old dressing, discard into plastic bag and place into trash. Cleanse the wound with Wound Cleanser prior to applying a clean dressing using gauze sponges, not tissues or cotton balls. Do not scrub or use excessive force. Pat dry using gauze sponges, not tissue or cotton balls. Topical: Triamcinolone Acetonide Cream, 0.1%, 15 (g) tube 3 x Per Week/30 Days Discharge Instructions: Mix 1:1 with Nystatin cream and apply to periwound and leg Prim Dressing: AquacelAg Advantage Dressing, 2X2 (in/in) 3 x Per Week/30 Days ary Discharge Instructions: Apply to wound as directed Secondary Dressing: Zetuvit Plus 4x8 (in/in) 3 x Per Week/30 Days Compression Wrap: 3-LAYER WRAP - Profore Lite LF 3 Multilayer Compression Bandaging System 3 x Per Week/30 Days Discharge Instructions: Apply 3 multi-layer wrap as prescribed. Patient Medications llergies: Celebrex A Notifications Medication Indication Start End 12/27/2022 cephalexin DOSE 1 - oral 500 mg tablet - 1 tablet oral four times daily x 7 days Electronic Signature(s) Signed: 12/27/2022  12:38:47 PM By: Kalman Shan DO Previous Signature: 12/27/2022 10:46:12 AM Version By: Kalman Shan DO Entered By: Kalman Shan on 12/27/2022 10:46:30 -------------------------------------------------------------------------------- Problem List Details Patient Name: Date of Service: Laura Jewett [name] 12/27/2022 10:00 A M Medical Record Number: [id number] Patient Account Number: [id number] Date of Birth/Sex: Treating RN: Apr 14, 1946 (77 y.o. [name] Primary Care Provider: [place] Other Clinician: Arohi, Salvatierra ([id number]) [id number] Page 8 of 14 Referring Provider: Treating Provider/Extender: [name] Weeks in Treatment: 51 Active Problems ICD-10 Encounter Code Description Active Date MDM Diagnosis 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sed11/23/2022 No Yes 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 11/02/2021 No Yes I87.311 Chronic venous hypertension (idiopathic) with ulcer of right lower extremity 08/30/2022 No Yes I87.2 Venous insufficiency (chronic) (peripheral) 07/06/2021 No Yes Z79.01 Long term (current) use of anticoagulants 07/06/2021 No Yes I10 Essential (primary) hypertension 07/06/2021 No Yes 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 07/06/2021 No Yes L03.115 Cellulitis of right lower limb 12/27/2022 No Yes Inactive Problems ICD-10 Code Description Active Date Inactive Date S81.802A Unspecified open wound, left lower leg, initial encounter 07/06/2021 07/06/2021 S91.101A Unspecified open wound of right great toe without damage to nail, initial encounter 08/24/2021 08/24/2021 S91.104A Unspecified open wound of right lesser toe(s) without damage to nail, initial encounter 08/24/2021 08/24/2021 Resolved Problems ICD-10 Code Description Active Date Resolved Date S91.104D Unspecified open  wound of right lesser toe(s) without damage to nail, subsequent 08/31/2021 08/31/2021 encounter S91.201D Unspecified open wound of right great toe with damage to nail, subsequent encounter 08/31/2021 08/31/2021 Electronic Signature(s) Signed: 12/27/2022 12:38:47 PM By: Kalman Shan DO Entered By: Kalman Shan on 12/27/2022 10:44:21 Laura Santiago ([id number]) [id number] Page 9 of 14 -------------------------------------------------------------------------------- Progress Note Details Patient Name: Date of Service: Laura Ace. 12/27/2022 10:00 A M Medical Record Number: [id number] Patient  Account Number: [id number] Date of Birth/Sex: Treating RN: 18-Jul-1946 (77 y.o. [name] Primary Care Provider: [place] Other Clinician: [name] Referring Provider: Treating Provider/Extender: Kalman Shan [facility] Weeks in Treatment: 60 Subjective Chief Complaint Information obtained from Patient Left lower extremity wound Right toe wounds Left upper lateral thigh wounds History of Present Illness (HPI) Admission 7/27 Ms. Stefani Baik is a 77 year old female with a past medical history of ADHD, metastatic breast cancer, stage IV chronic kidney disease, history of DVT on Xarelto and chronic venous insufficiency that presents to the clinic for a chronic left lower extremity wound. She recently moved to [facility] 4 days ago. She was being followed by wound care center in [name]. She reports a 10-year history of wounds to her left lower extremity that eventually do heal with debridement and compression therapy. She states that the current wound reopened 4 months ago and she is using Vaseline and Coban. She denies signs of infection. 8/3; patient presents for 1 week follow-up. She reports no issues or complaints today. She states she had vascular studies done in the last week. She denies signs of infection.  She brought her little service dog with her today. 8/17; patient presents for follow-up. She has missed her last clinic appointment. She states she took the wrap off and attempted to rewrap her leg. She is having difficulty with transportation. She has her service dog with her today. Overall she feels well and reports improvement in wound healing. She denies signs of infection. She reports owning an old Velcro wrap compression and has this at her living facility 9/14; patient presents for follow-up. Patient states that over the past 2 to 3 weeks she developed toe wounds to her right foot. She attributes this to tight fitting shoes. She subsequently developed cellulitis in the right leg and has been treated by doxycycline by her oncologist. She reports improvement in symptoms however continues to have some redness and swelling to this leg. T the left lower extremity patient has been having her wraps changed with home health twice weekly. She states that the [facility] is not helping control o the drainage. Other than that she has no issues or complaints today. She denies signs of infection to the left lower extremity. 9/21; patient presents for follow-up. She reports seeing infectious disease for her cellulitis. She reports no further management. She has home health that changes the wraps twice weekly. She has no issues or complaints today. She denies signs of infection. 10/5; patient presents for follow-up. She has no issues or complaints today. She denies signs of infection. She states that the right great toe has not been dressed by home health. 10/12; patient presents for follow-up. She has no issues or complaints today. She reports improvement in her wound healing. She has been using silver alginate to the right great toe wound. She denies signs of infection. 10/26; patient presents for follow-up. Home health did not have sorbact so they continued to use Hydrofera Blue under the wrap. She has  been using silver alginate to the great toe wound however she did not have a dressing in place today. She currently denies signs of infection. 11/2; patient presents for follow-up. She has been using sorb act under the compression wrap. She reports using silver alginate to the toe wound again she does not have a dressing in place. She currently denies signs of infection. 11/23; patient presents for follow-up. Unfortunately she has missed her last 2  clinic appointments. She was last seen 3 weeks ago. She did her own compression wrap with Kerlix and Coban yesterday after seeing vein and vascular. She has not been dressing her right great toe wound. She currently denies signs of infection. 11/30; patient presents for 1 week follow-up. She states she changed her dressing last week prior to home health and use sorb act with Dakin's and Hydrofera Blue. Home health has changed the dressing as well and they have been using sorbact. T oday she reports increased redness to her right lower extremity. She has a history of cellulitis to this leg. She has been using silver alginate to the right great toe. Unfortunately she had an episode of diarrhea prior to coming in and had feces all over the right leg and to the wrap of her left leg. 12/7; patient presents for 1 week follow-up. She states that home health did not come out to change the dressing and she took it off yesterday. It is unclear if she is dressing the right toe wound. She denies signs of infection. 12/14; patient presents for 1 week follow-up. She has no issues or complaints today. 12/21; patient presents for follow-up. She has no issues or complaints today. She denies signs of infection. 12/28/2021; patient presents for follow-up. She was hospitalized for sepsis secondary to right lower extremity cellulitis On 12/23. She states she is currently at a SNF. She states that she was started on doxycycline this morning for her right great toe swelling and  redness. She is not sure what dressings have been done to her left lower extremity for the past 3 weeks. She says its been mainly gauze with an Ace wrap. 1/25; patient presents for follow-up. She is still residing in a skilled nursing facility. She reports mild pain to the left lower extremity wound bed. She states she is going to see a podiatrist soon. Laura Santiago, Laura Santiago ([id number]) [id number] Page 10 of 14 2/8; patient presents for follow-up. She has moved back to her residential community from her skilled nursing facility. She has no issues or complaints today. She denies signs of systemic infections. 2/15; patient presents for follow-up. He has no issues or complaints today. She denies systemic signs of infection. 2/22; patient presents for follow-up. She has no issues or complaints today. She denies signs of infection. 3/1; patient presents for follow-up. She states that home health came out the day after she was seen in our clinic and yesterday to do the wrap change. She denies signs of infection. She reports excoriated skin on the ankle. 3/8; patient presents for follow-up. She has no issues or complaints today. She denies signs of infection. 3/15; patient presents for follow-up. Home health has been coming out to change the dressings. She reports more tenderness to the wound site. She denies purulent drainage, increased warmth or erythema to the area. 4/5; patient presents for follow-up. She has missed her last 2 clinic appointments. I have not seen her in 3 weeks. She was recently hospitalized for altered mental status. She was involuntarily committed. She was evaluated by psychiatry and deemed to have competency. There was no specific cause of her altered mental status. It was concluded that her physical and mental health were declining due to her chronic medical conditions. Currently home health has been coming out for dressing changes. Patient has also been  doing her own dressing changes. She reports more skin breakdown to the periwound and now has a new wound. She denies fever/chills. She reports continued tenderness  to the wound site. 4/12; patient with significant venous insufficiency and a large wound on her left lower leg taking up about 80% of the circumference of her lower leg. Cultures of this grew MRSA and Pseudomonas. She had completed a course of ciprofloxacin now is starting doxycycline. She has been using Dakin's wet-to-dry and a Tubigrip. She has home health twice a week and we change it once. 4/19; patient presents for follow-up. She completed her course of doxycycline. She has been using Dakin's wet-to-dry dressing and Tubigrip. Home health changes the dressing twice weekly. Currently she has no issues or complaints. 4/26; patient presents for follow-up. At last clinic visit orders for home health were Iodosorb under compression therapy. Unfortunately they did not have the dressing and have been using Dakin's and gentamicin under the wrap. Patient currently denies signs of infection. She has no issues or complaints today. 5/3; patient presents for follow-up. Again Iodosorb has not been used under the compression therapy when home health comes out to change the wrap and dressing. They have been using Sorbact. It is unclear why this is happening since we send orders weekly to the agency. She denies signs of infection. Patient has not purchased the [facility] antibiotics. We reached out to the company and they said they have been trying to contact her on a regular basis. We gave the patient the number to call to order the medication. 5/10; patient presents for follow-up. She has no issues or complaints today. Again home health has not been using Iodosorb. Mepilex was on the wound bed. No other dressings noted. She brought in her Keystone antibiotics. She denies signs of infection. 5/17; patient presents for follow-up. Home health has come out  twice since she was last seen. Joint well she has been using Keystone antibiotic with Sorbact under the compression wrap. She has no issues or complaints today. She denies signs of infection. 5/24; patient presents for follow-up. We have been using Keystone antibiotics with Sorbact under compression therapy. She is tolerating the treatment well. She is reporting improvement in wound healing. She denies signs of infection. 5/31; patient presents for follow-up. We continue to do [facility] antibiotics with Sorbact under compression therapy. She continues to report improvement in wound healing. Home health comes out and changes the dressing once weekly. 05-17-2022 upon evaluation today patient appears to be doing better in regard to her wound especially compared to the last time I saw her. Fortunately I do think that she is seeing improvements. With that being said I do believe that she may be benefit from sharp debridement today to clear away some of the necrotic debris I discussed that with her as well. She is an amendable to that plan. Otherwise she is very pleased with how the [employer] is doing for her. 6/14; patient presents for follow-up. We have been using Keystone antibiotic with Sorbact and absorbent dressings under 3 layer compression. She has no issues or complaints today. She reports improvement in wound healing. She denies signs of infection. 6/21; patient presents for follow-up. We are continuing with [facility] antibiotic and Sorbact under 3 layer compression. Patient has no complaints. Continued wound healing is happening. She denies signs of infection. 6/28; patient presents for follow-up. We have been using Keystone antibiotic with Sorbact under 3 layer compression. Usually home health comes out and changes the dressing twice a week. Unfortunately they did not go out to change the dressing. It is unclear why. Patient did not call them. She currently denies signs of infection.  7/5; patient  presents for follow-up. We have been using Keystone antibiotic with calcium alginate under 3 layer compression. She reports improvement in wound healing. She denies signs of infection. Home health has come out to do dressing changes twice this past week. 7/12; patient presents for follow-up. We have been using Keystone antibiotic with calcium alginate under 3 layer compression. Patient states that home health came out once last week to change the dressing. She reports improvement in wound healing. She currently denies signs of infection. 7/19; patient presents for follow-up. We have been using Keystone antibiotic with calcium alginate under 3 layer compression. Home health came out once last week to change the dressing. She has no issues or complaints today. She denies signs of infection. 8/2; patient presents for follow-up. We have been using Keystone antibiotic with calcium alginate under 3 layer compression. Unfortunately she missed her appointment last week and home health did not come out to do dressing changes. Patient currently denies signs of infection. 8/9; patient presents for follow-up. We have been using Keystone with calcium alginate under 3 layer compression. She states that home health came out once last week. She currently denies signs of infection. Her wrap was completely wet. She states she was cleaning the top of the leg and water soaked down into the wrap. 8/16; patient presents for follow-up. We have been using Keystone with calcium alginate under 3 layer compression. She states that home health came out twice last week. She has no issues or complaints today. 8/23; patient presents for follow-up. He has been using Keystone with calcium alginate under 3 layer compression. Home health came out twice last week. She denies signs of infection. 8/30; patient presents for follow-up. We have been using Keystone with calcium alginate under 3 layer compression. Home health came out once  last week to change the dressing. Patient reports improvement in wound healing. She states she is almost done with her chemotherapy infusions and has 1 more left. 9/13; patient presents for follow-up. She has lost the capsules to her [facility] antibiotic which I believe is the vancomycin pills. She has her Zosyn powder today. We have been using Keystone antibiotic ointment with calcium alginate under 3 layer compression. She is concerned about systemic infection however her vitals are stable and there is no surrounding soft tissue infection. She would like to remain a patient in our wound care center however would like a second opinion for her wound care at another facility. She asked to be referred to [facility] wound care center. Laura Santiago, Laura Santiago ([id number]) [id number] Page 11 of 14 9/20; patient presents for follow-up. She found her vancomycin capsules and brought in her complete Keystone antibiotic ointment set today. Unfortunately she has developed skin breakdown and Erythema to the right lower extremity With increased swelling. She states she went to a pow wow Over the weekend and was on her feet for extended periods of time. She saw her oncologist yesterday who prescribed her doxycycline for her right lower extremity erythema. 9/27; patient presents for follow-up. We have been using Keystone antibiotic with Aquacel under 3 layer compression to the lower extremities bilaterally. When home health came and changed the wrap she secretly put coffee into the spray mix along with [facility] antibiotic on her leg thinking the acidic component would better activate the zoysn (sonething she discussed with her microbiologist brother). She has reported improvement in wound healing. 10/4; patient presents for follow-up. She has no issues or complaints today. We have been doing Aquacel and  keystone under 3 layer compression to the lower extremities bilaterally. This morning she took the  right lower extremity wrap off as it was uncomfortable. She has no open wounds to this leg. 10/11; patient presents for follow-up. We have been doing Aquacel with Keystone antibiotic ointment under 3 layer compression to the left lower extremity. She developed a small blister to the anterior aspect of the left leg noticed when the wrap was taken off on intake. She currently denies signs of infection. 10/18; patient presents for follow-up. We have been doing Aquacel with Keystone antibiotic ointment under 3 layer compression to the left lower extremity. There has been continued improvement in wound healing. She denies signs of infection. 10/25; patient arrives for treatment of venous insufficiency ulcers on her left lower leg both lateral and medial are remanence of apparently a circumferential wound. Much improved. We are using topicals Keystone and Aquacel Ag under 3 layer compression we continue to make good progress. The patient talk to me at some length with regards to different things she has on her forehead and her Peri orbital area for which she is apparently applying [place]. She feels that what ever we are treating on her wounds is a more systemic problem. I really was not able to get a handle on what she is talking about however I did caution her not to put the [place] in her eyes. 11/1; her wounds continue to improve she is using Keystone and Aquacel Ag G under 3 layer compression. Our intake nurse notes erythema and edema in the right leg. The patient has a litany of concerns with regards to a rash on her forehead or ears and other systemic complaints. She has an appointment with dermatology on November 11 11/8; patient presents for follow-up. We have been using Keystone and Aquacel under 4-layer compression. She has no issues or complaints today. She reports improvement in wound healing. 11/15; patient presents for follow-up. We have been using Aquacel with Keystone antibiotic under 3  layer compression. Patient continues improvement in wound healing. 12/6; patient presents for follow-up. We have been using Aquacel with Keystone antibiotic ointment under 3 layer compression. Wounds appear well-healing. 12/13; patient presents for follow-up. We have been using Aquacel with Keystone antibiotic under 3 layer compression. She has no issues or complaints today. 12/27 left lateral medial ankle. Superficial wounds remain there is significantly improved we are using Keystone backed with Zetuvit under 4-layer compression 1/3; patient presents for follow-up. Her wounds appear well-healing. We have been using Aquacel Ag with Keystone antibiotic ointment under compression therapy. This should be a 3 layer compression. 1/17; patient presents for follow-up. Her wounds on her left lower extremity are well-healing. We are using Aquacel Ag and Keystone antibiotic ointment under compression therapy. She missed her last clinic appointment. She states that the wrap has been changed twice weekly since she was last seen. Unfortunately she has developed increased warmth and redness to the right lower extremity consistent with cellulitis. She states this started a few days ago. Objective Constitutional Vitals Time Taken: 10:02 AM, Height: 66 in, Weight: 153 lbs, BMI: 24.7, Temperature: 98.0 F, Pulse: 74 bpm, Respiratory Rate: 18 breaths/min, Blood Pressure: 133/79 mmHg. General Notes: T the left lower extremity there are areas with nonviable tissue. Postdebridement there are a few small scattered wounds to the lateral and o medial aspect with granulation tissue. No signs of infection to the sites. T the right lower extremity there is increased warmth and erythema throughout the leg o that  stops below the knee. No purulent drainage. Integumentary (Hair, Skin) Wound #1 status is Open. Original cause of wound was Gradually Appeared. The date acquired was: 04/06/2021. The wound has been in treatment 77  weeks. The wound is located on the Left,Medial Lower Leg. The wound measures 0.2cm length x 0.3cm width x 0.1cm depth; 0.047cm^2 area and 0.005cm^3 volume. There is Fat Layer (Subcutaneous Tissue) exposed. There is a medium amount of serosanguineous drainage noted. The wound margin is flat and intact. There is medium (34-66%) red, pink granulation within the wound bed. There is a medium (34-66%) amount of necrotic tissue within the wound bed including Adherent Slough. Wound #8 status is Open. Original cause of wound was Pressure Injury. The date acquired was: 10/18/2022. The wound has been in treatment 10 weeks. The wound is located on the Left,Lateral Lower Leg. The wound measures 0.1cm length x 0.1cm width x 0.1cm depth; 0.008cm^2 area and 0.001cm^3 volume. There is Fat Layer (Subcutaneous Tissue) exposed. There is a medium amount of serosanguineous drainage noted. There is medium (34-66%) pink granulation within the wound bed. There is a medium (34-66%) amount of necrotic tissue within the wound bed including Adherent Slough. 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 I debrided nonviable tissue. I recommended continuing the course with [facility] antibiotic ointment and Aquacel Ag under 3 layer compression. Unfortunately she has developed increased erythema and warmth to the right lower extremity consistent with cellulitis. Will go ahead and treat with oral antibiotics. I  recommended she call her PCP office early next week if the area has not resolved. 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 There was a Excisional Skin/Subcutaneous Tissue Debridement with a total area of 12 sq cm performed by Kalman Shan, MD. With the following instrument(s): Curette to remove Viable and Non-Viable tissue/material. Material removed includes Subcutaneous Tissue, Slough, Skin: Dermis, and Skin: Epidermis. A time out was conducted at 10:30, prior to the start of the procedure. A Minimum amount of bleeding was controlled with Pressure. The procedure was tolerated well. Santiago Debridement Measurements: 5.5cm length x 2cm width x 0.1cm depth; 0.864cm^3 volume. Character of Wound/Ulcer Santiago Debridement is stable. Severity of Tissue Santiago Debridement is: Fat layer exposed. Santiago procedure Diagnosis Wound #1: Same as Pre-Procedure Pre-procedure diagnosis of Wound #1 is a Venous Leg Ulcer located on the Left,Medial Lower Leg . There was a Four Layer Compression Therapy Procedure with a pre-treatment ABI of 1.5 by [name]. Santiago procedure Diagnosis Wound #1: Same as Pre-Procedure Wound #8 Pre-procedure diagnosis of Wound #8 is a Venous Leg Ulcer located on the Left,Lateral Lower Leg .Severity of Tissue Pre Debridement is: Fat layer exposed. There was a Excisional Skin/Subcutaneous Tissue Debridement with a total area of 1 sq cm performed by Kalman Shan, MD. With the following instrument(s): Curette to remove Viable and Non-Viable tissue/material. Material removed includes Subcutaneous Tissue, Slough, Skin: Dermis, and Skin: Epidermis. A time out was conducted at 10:33, prior to the start of the procedure. A Minimum amount of  bleeding was controlled with Pressure. The procedure was tolerated well. Santiago Debridement Measurements: 0.4cm length x 0.3cm  width x 0.1cm depth; 0.009cm^3 volume. Character of Wound/Ulcer Santiago Debridement is stable. Severity of Tissue Santiago Debridement is: Fat layer exposed. Santiago procedure Diagnosis Wound #8: Same as Pre-Procedure Plan Follow-up Appointments: Return Appointment in 1 week. Nurse Visit as needed Home Health: [place]: Rocky Morel [phone] [place] for wound care. May utilize formulary equivalent dressing for wound treatment orders unless otherwise specified. Home Health Nurse may visit [facility] to address patientoos wound care needs. - home health to see patient 2 times per week, patient to be seen at wound clinic once per week **Please direct any NON-WOUND related issues/requests for orders to patient's Primary Care Physician. **If current dressing causes regression in wound condition, may D/C ordered dressing product/s and apply Normal Saline Moist Dressing daily until next [place] or Other MD appointment. **Notify [facility] of regression in wound condition at [phone]. Other Home Health Orders/Instructions: Bathing/ Shower/ Hygiene: May shower with wound dressing protected with water repellent cover or cast protector. No tub bath. Anesthetic (Use 'Patient Medications' Section for Anesthetic Order Entry): Lidocaine applied to wound bed Edema Control - Lymphedema / Segmental Compressive Device / Other: Optional: One layer of unna paste to top of compression wrap (to act as an anchor). Elevate, Exercise Daily and Avoid Standing for Long Periods of Time. Elevate legs to the level of the heart and pump ankles as often as possible Elevate leg(s) parallel to the floor when sitting. DO YOUR BEST to sleep in the bed at night. DO NOT sleep in your recliner. Long hours of sitting in a recliner leads to swelling of the legs and/or potential wounds on your backside. Additional Orders / Instructions: Follow Nutritious Diet and Increase Protein  Intake Medications-Please add to medication list.: Keystone Compound P.O. Antibiotics - start Keflex for right leg cellulitis WOUND #1: - Lower Leg Wound Laterality: Left, Medial Cleanser: Wound Cleanser 3 x Per Week/30 Days Discharge Instructions: Wash your hands with soap and water. Remove old dressing, discard into plastic bag and place into trash. Cleanse the wound with Wound Cleanser prior to applying a clean dressing using gauze sponges, not tissues or cotton balls. Do not scrub or use excessive force. Pat dry using gauze sponges, not tissue or cotton balls. Topical: Triamcinolone Acetonide Cream, 0.1%, 15 (g) tube 3 x Per Week/30 Days Discharge Instructions: lower leg Prim Dressing: AquacelAg Advantage Dressing, 2X2 (in/in) 3 x Per Week/30 Days ary Discharge Instructions: Apply to wound as directed Secondary Dressing: Zetuvit Plus 4x8 (in/in) 3 x Per Week/30 Days Com pression Wrap: 3-LAYER WRAP - Profore Lite LF 3 Multilayer Compression Bandaging System 3 x Per Week/30 Days Discharge Instructions: Apply 3 multi-layer wrap as prescribed. WOUND #8: - Lower Leg Wound Laterality: Left, Lateral Cleanser: Wound Cleanser 3 x Per Week/30 Days Laura Santiago, Laura Santiago ([id number]) [id number] Page 13 of 14 Discharge Instructions: Wash your hands with soap and water. Remove old dressing, discard into plastic bag and place into trash. Cleanse the wound with Wound Cleanser prior to applying a clean dressing using gauze sponges, not tissues or cotton balls. Do not scrub or use excessive force. Pat dry using gauze sponges, not tissue or cotton balls. Topical: Triamcinolone Acetonide Cream, 0.1%, 15 (g) tube 3 x Per Week/30 Days Discharge Instructions: Mix 1:1 with Nystatin cream and apply to periwound and leg Prim Dressing: AquacelAg Advantage Dressing, 2X2 (in/in) 3 x Per Week/30  Days ary Discharge Instructions: Apply to wound as directed Secondary Dressing: Zetuvit Plus 4x8  (in/in) 3 x Per Week/30 Days Com pression Wrap: 3-LAYER WRAP - Profore Lite LF 3 Multilayer Compression Bandaging System 3 x Per Week/30 Days Discharge Instructions: Apply 3 multi-layer wrap as prescribed. 1. Keflex 2. Aquacel Ag with Keystone antibiotic under 3 layer compressionooleft lower extremity 3. Follow-up in 1 week Electronic Signature(s) Signed: 12/27/2022 12:38:47 PM By: Kalman Shan DO Entered By: Kalman Shan on 12/27/2022 10:43:20 -------------------------------------------------------------------------------- SuperBill Details Patient Name: Date of Service: Laura Santiago, Laura [name] 12/27/2022 Medical Record Number: [id number] Patient Account Number: [id number] Date of Birth/Sex: Treating RN: 1945/12/15 (77 y.o. [name] Primary Care Provider: [place] Other Clinician: [name] Referring Provider: Treating Provider/Extender: [name] Weeks in Treatment: 77 Diagnosis Coding ICD-10 Codes Code Description [phone] 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 I87.311 Chronic venous hypertension (idiopathic) with ulcer of right lower extremity I87.2 Venous insufficiency (chronic) (peripheral) Z79.01 Long term (current) use of anticoagulants I10 Essential (primary) hypertension 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 Cellulitis of right lower limb Facility Procedures : CPT4 Code: [id number] Description: 62130 - DEB SUBQ TISSUE 20 SQ CM/< ICD-10 Diagnosis Description 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 Modifier: sed Quantity: 1 Physician Procedures : CPT4 Code Description Modifier [id number] - WC PHYS LEVEL 4 - EST PT ICD-10 Diagnosis Description  L03.115 Cellulitis of right lower limb I87.2 Venous insufficiency (chronic) (peripheral) I10 Essential (primary) hypertension Laura Santiago, Laura Santiago  ([id number]) [id number] Quantity: 1 df Page 14 of 14 : [id number] - WC PHYS SUBQ TISS 20 SQ CM 1 ICD-10 Diagnosis Description 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 Quantity: Electronic Signature(s) Signed: 12/27/2022 12:38:47 PM By: Kalman Shan DO Entered By: Kalman Shan on 12/27/2022 10:44:58

## 2022-12-31 ENCOUNTER — Other Ambulatory Visit: Payer: Self-pay | Admitting: Oncology

## 2022-12-31 ENCOUNTER — Encounter: Payer: Self-pay | Admitting: Oncology

## 2023-01-01 ENCOUNTER — Ambulatory Visit: Payer: Medicare Other | Admitting: Oncology

## 2023-01-01 MED ORDER — OXYCODONE HCL 10 MG PO TABS: 15.0000 mg | ORAL_TABLET | Freq: Four times a day (QID) | ORAL | 0 refills | Status: DC | PRN

## 2023-01-03 ENCOUNTER — Encounter: Payer: Self-pay | Admitting: Oncology

## 2023-01-03 ENCOUNTER — Inpatient Hospital Stay: Payer: Medicare Other | Attending: Oncology | Admitting: Oncology

## 2023-01-03 ENCOUNTER — Encounter (HOSPITAL_BASED_OUTPATIENT_CLINIC_OR_DEPARTMENT_OTHER): Payer: Medicare Other | Admitting: Internal Medicine

## 2023-01-03 VITALS — BP 153/84 | HR 85 | Resp 18 | Wt 149.0 lb

## 2023-01-03 DIAGNOSIS — N184 Chronic kidney disease, stage 4 (severe): Secondary | ICD-10-CM | POA: Diagnosis not present

## 2023-01-03 DIAGNOSIS — I872 Venous insufficiency (chronic) (peripheral): Secondary | ICD-10-CM

## 2023-01-03 DIAGNOSIS — Z7189 Other specified counseling: Secondary | ICD-10-CM

## 2023-01-03 DIAGNOSIS — C774 Secondary and unspecified malignant neoplasm of inguinal and lower limb lymph nodes: Secondary | ICD-10-CM | POA: Insufficient documentation

## 2023-01-03 DIAGNOSIS — C7951 Secondary malignant neoplasm of bone: Secondary | ICD-10-CM | POA: Diagnosis not present

## 2023-01-03 DIAGNOSIS — C50911 Malignant neoplasm of unspecified site of right female breast: Secondary | ICD-10-CM | POA: Diagnosis not present

## 2023-01-03 DIAGNOSIS — Z808 Family history of malignant neoplasm of other organs or systems: Secondary | ICD-10-CM | POA: Insufficient documentation

## 2023-01-03 DIAGNOSIS — L97822 Non-pressure chronic ulcer of other part of left lower leg with fat layer exposed: Secondary | ICD-10-CM | POA: Diagnosis not present

## 2023-01-03 DIAGNOSIS — I87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: Secondary | ICD-10-CM

## 2023-01-03 DIAGNOSIS — L03115 Cellulitis of right lower limb: Secondary | ICD-10-CM | POA: Diagnosis not present

## 2023-01-03 DIAGNOSIS — C79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: Secondary | ICD-10-CM | POA: Diagnosis not present

## 2023-01-03 DIAGNOSIS — C50919 Malignant neoplasm of unspecified site of unspecified female breast: Secondary | ICD-10-CM

## 2023-01-03 DIAGNOSIS — I10 Essential (primary) hypertension: Secondary | ICD-10-CM

## 2023-01-03 DIAGNOSIS — I129 Hypertensive chronic kidney disease with stage 1 through stage 4 chronic kidney disease, or unspecified chronic kidney disease: Secondary | ICD-10-CM | POA: Insufficient documentation

## 2023-01-03 DIAGNOSIS — Z800 Family history of malignant neoplasm of digestive organs: Secondary | ICD-10-CM | POA: Diagnosis not present

## 2023-01-03 NOTE — Progress Notes (Signed)
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  She had a screening mammogram in September 2014 which showed 2.1 x 2.3 x 1.8 cm irregular mass in her right breast.  It was ER 10% positive PR negative and HER2 positive +3.  She received neoadjuvant chemotherapy with Taxol Herceptin and Perjeta for 4 cycles followed by dose dense AC/Herceptin x4 which she completed in March 2015.  She had a right lumpectomy on 04/03/2014 which showed scant residual invasive ductal carcinoma [id number].  She completed 1 year of adjuvant Herceptin chemotherapy and also completed adjuvant radiation treatment.  She was recommended anastrozole which she took on and off starting November 2015 and stopped sometime in 2020.   She was then hospitalized with neck pain and was found to have lytic lesions involving C5-C6 with pathological vertebral fractures.  She underwent radiation treatment to this area.  Image guided biopsy of the L1 vertebral body showed metastatic carcinoma consistent with breast origin ER 10% PR 0% and HER2 amplified ratio 5.1 average HER2 signal number per cell 15.0 average CEP 17 signals number per cell 3.0.  Baseline echocardiogram on 05/02/2021 showed a normal EF  of 62% she was recommended Taxol Herceptin and Perjeta which she received for 2 cycles at [facility] until June 10, 2021   She has chronic pain from her bone metastases for which she is currently on oxycodone 10 mg every 4 hours as needed and 12 mcg fentanyl patch.  She was seeing pain clinic when she was living in [name].   Patient has also been on Zometa when she was in [name] but she does have some ongoing dental issues.  She has received Xgeva in the past as well.  Her last Delton See was in July 2021.  Last PET scan was on 03/21/2021 which showed diffuse osseous metastatic disease involving the head neck chest abdomen and pelvis and spine.  Left lung apex hypermetabolic nodule and multiple hypermetabolic liver lesions concerning for disease involvement.  Enlarged hypermetabolic left inguinal lymph nodes along with hypermetabolic external iliac and left supraclavicular lymph nodes   Patient is now moved to [place] to be close to her daughter.  She lives in an independent living.  Patient received Taxol Herceptin and Perjeta for about a year and is presently on maintenance Herceptin and Perjeta    Interval history-no acute issues since last visit.  Back pain improved after she had a good bowel movement.  ECOG PS- 2 Pain scale- 3 Opioid associated constipation- no  Review of systems- Review of Systems  Constitutional:  Positive for malaise/fatigue. Negative for chills, fever and weight loss.  HENT:  Negative for congestion, ear discharge and nosebleeds.   Eyes:  Negative for blurred vision.  Respiratory:  Negative for cough, hemoptysis, sputum production, shortness of breath and wheezing.   Cardiovascular:  Negative for chest pain, palpitations, orthopnea and claudication.  Gastrointestinal:  Negative for abdominal pain, blood in stool, constipation, diarrhea, heartburn, melena, nausea and vomiting.  Genitourinary:  Negative for dysuria, flank pain, frequency, hematuria and urgency.  Musculoskeletal:   Negative for back pain, joint pain and myalgias.  Skin:  Negative for rash.  Neurological:  Negative for dizziness, tingling, focal weakness, seizures, weakness and headaches.  Endo/Heme/Allergies:  Does not bruise/bleed easily.  Psychiatric/Behavioral:  Negative for depression and suicidal ideas. The patient does not have insomnia.       Allergies  Allergen Reactions   Other Diarrhea and Nausea And Vomiting    Pt has had episodes of nausea, vomiting and diarrhea right after receiving IV technetium for NM study on two occasions.   Technetium Tc 34mMedronate Diarrhea and Nausea And Vomiting    Pt has had episodes of nausea, vomiting and diarrhea right after receiving IV technetium for NM study on two occasions.    Corticosteroids Other (See Comments)    [place]. Notes that it causes agitation intolerance   Sulfa Antibiotics Other (See Comments)    [place] has allergy but we do not know reactions when taking the drug   Celebrex [Celecoxib] Rash     Past Medical History:  Diagnosis Date   ADHD (attention deficit hyperactivity disorder)    [place] note   Anemia    IDA 11/26/2019, Anemia in stage 4 chronic kidney disease ([place] 09/03/2020   Arthritis    osteoarthritis right knee 09/30/2014   [place]ast [place]05[place]   [place] 2 [place] 3[place]   Cancer related pain 03/28/2021   [place]   cervical compression fracture 03/18/2021   [place]   DVT of lower extremity, bilateral ([place] 03/30/2014   [place]  Generalized muscle weakness 03/31/2016   [place]  GERD (gastroesophageal reflux disease) 05/15/2013   [place]  Hyper[place], secondary [place] 04/25/2018   [place]  Hypertension 02/20/2016   [place]   [place]  [place] 054/65/6812  [place]  Metabolic syndrome 075/17/0017  [place]   Osteopenia after menopause 03/29/2016   [place]  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   [place]  Stasis ulcer of left lower extremity ([place] 03/29/2016   [place]    Past Surgical History:  Procedure Laterality Date   CESAREAN SECTION     unknown   fibroid removed  N/A    [place]   IR FLUORO GUIDE CV LINE LEFT  07/27/2021   IR PORT REPAIR CENTRAL VENOUS ACCESS DEVICE Left    [place]      unknown   PORTA CATH INSERTION N/A 02/13/2022   Procedure: PORTA CATH INSERTION;  Surgeon: DAlgernon Huxley MD;  [facility];  Service: Cardiovascular;  Laterality: N/A;   RE[place]CATARACT EXTRACAP,INSERT LENS Bilateral  Bilateral 09/05/2012   [place]    [place]    LUM[place]OMY Right [place]/2014   [place]   Socioeconomic History   Marital status:  Divorced    Spouse name: Not on file   Number of children: Not on file   Years of education: Not on file   Highest education level: Not on file  Occupational History   Occupation: retired Teacher, music    Comment: In [place]  Tobacco Use   Smoking status: Never   Smokeless tobacco: Never  Vaping Use   Vaping Use: Never used  Substance and Sexual Activity   Alcohol use: Not Currently   Drug use: Not Currently   Sexual activity: Not Currently  Other Topics Concern   Not on file  Social History Narrative   Not on file   Social Determinants of Health   Financial Resource Strain: Low Risk  (03/17/2022)   Overall Financial Resource Strain (CARDIA)    Difficulty of Paying Living Expenses: Not hard at all  Food Insecurity: No Food Insecurity (03/17/2022)   Hunger Vital Sign    Worried About Running Out of Food in the Last Year: Never true    Ran Out of Food in the Last Year: Never true  Transportation Needs: No Transportation Needs (03/17/2022)   PRAPARE - Hydrologist (Medical): No     Lack of Transportation (Non-Medical): No  Physical Activity: Inactive (03/17/2022)   Exercise Vital Sign    Days of Exercise per Week: 0 days    Minutes of Exercise per Session: 0 min  Stress: No Stress Concern Present (03/17/2022)   [place]    Feeling of Stress : Only a little  Social Connections: Socially Isolated (03/17/2022)   Social Connection and Isolation Panel [NHANES]    Frequency of Communication with Friends and Family: Twice a week    Frequency of Social Gatherings with Friends and Family: Twice a week    Attends Religious Services: Never    Marine scientist or Organizations: No    Attends Archivist Meetings: Never    Marital Status: Widowed  Intimate Partner Violence: Not At Risk (03/17/2022)   Humiliation, Afraid, Rape, and Kick questionnaire    Fear of Current or Ex-Partner: No    Emotionally Abused: No    Physically Abused: No    Sexually Abused: No    Family History  Problem Relation Age of Onset   Pancreatic cancer Mother    Stroke Father    Diabetes Father    Hypertension Father    Heart disease Father    Skin cancer Father    Varicose Veins Father    Skin cancer Brother    Cancer - Prostate Brother      Current Outpatient Medications:    acetaminophen (TYLENOL) 500 MG tablet, Take 500 mg by mouth every 4 (four) hours as needed., Disp: , Rfl:    amphetamine-dextroamphetamine (ADDERALL XR) 30 MG 24 hr capsule, Take 1 capsule (30 mg total) by mouth daily., Disp: 30 capsule, Rfl: 0   calcium citrate (CALCITRATE - DOSED IN MG ELEMENTAL CALCIUM) 950 (200 Ca) MG tablet, Take 200 mg of elemental calcium by mouth daily., Disp: , Rfl:    doxycycline (VIBRA-TABS) 100 MG tablet, Take 1 tablet (100 mg total) by mouth 2 (two) times daily., Disp: 14 tablet, Rfl: 0   DULoxetine (CYMBALTA) 30 MG capsule, Take by mouth., Disp: , Rfl:    empagliflozin (JARDIANCE) 10 MG TABS tablet, Take 1  tablet (10 mg total) by mouth daily before breakfast., Disp: 30 tablet, Rfl: 11  fentaNYL (DURAGESIC) 25 MCG/HR, PLACE 1 PATCH ONTO THE SKIN EVERY THREE DAYS AS DIRECTED., Disp: 10 patch, Rfl: 0   fluocinonide (LIDEX) 0.05 % external solution, Apply once or twice daily to scalp as needed for itching.  Avoid applying to face, groin, and axilla., Disp: 60 mL, Rfl: 2   gabapentin (NEURONTIN) 100 MG capsule, TAKE 1 CAPSULE BY MOUTH 3 TIMES DAILY, Disp: 90 capsule, Rfl: 1   hydrocortisone 2.5 % cream, Apply 1 to 2 times daily for itchy rash at face, scalp, ears, and forehead., Disp: 30 g, Rfl: 3   hydrOXYzine (ATARAX) 25 MG tablet, Take by mouth., Disp: , Rfl:    ketoconazole (NIZORAL) 2 % shampoo, apply every other day  massage into scalp and leave in for 5 minutes before rinsing out, Disp: 120 mL, Rfl: 3   lisinopril (ZESTRIL) 20 MG tablet, Take 1 tablet (20 mg total) by mouth daily., Disp: 30 tablet, Rfl: 3   LORazepam (ATIVAN) 0.5 MG tablet, Take 1 tablet (0.5 mg total) by mouth daily as needed for anxiety., Disp: 30 tablet, Rfl: 0   Multiple Vitamin (MULTI-VITAMIN) tablet, Take 1 tablet by mouth daily., Disp: , Rfl:    NON FORMULARY, Apply 1 Dose topically daily. Pt mixes up piperacillim and tazobactam and puts it on the wounds of left leg, Disp: , Rfl:    ondansetron (ZOFRAN) 8 MG tablet, Take 1 tablet (8 mg total) by mouth as needed for nausea or vomiting., Disp: 30 tablet, Rfl: 0   Oxycodone HCl 10 MG TABS, Take 1.5 tablets (15 mg total) by mouth every 6 (six) hours as needed., Disp: 90 tablet, Rfl: 0   rivaroxaban (XARELTO) 20 MG TABS tablet, Take 1 tablet (20 mg total) by mouth daily with supper., Disp: 30 tablet, Rfl: 5   sodium hypochlorite (DAKIN'S 1/4 STRENGTH) 0.125 % SOLN, Apply 1 application topically as directed. Moisten gauze with solution and wrap wound, Disp: , Rfl:    naloxone (NARCAN) nasal spray 4 mg/0.1 mL, SPRAY 1 SPRAY INTO ONE NOSTRIL AS DIRECTED FOR OPIOID OVERDOSE (TURN  PERSON ON SIDE AFTER DOSE. IF NO RESPONSE IN 2-3 MINUTES OR PERSON RESPONDS BUT RELAPSES, REPEAT USING A NEW SPRAY DEVICE AND SPRAY INTO THE OTHER NOSTRIL. CALL 911 AFTER USE.) * EMERGENCY USE ONLY * (Patient not taking: Reported on 01/03/2023), Disp: 1 each, Rfl: 0 No current facility-administered medications for this visit.  Facility-Administered Medications Ordered in Other Visits:    heparin lock flush 100 UNIT/ML injection, , , ,   Physical exam:  Vitals:   01/03/23 1128 01/03/23 1130  BP:  (!) 153/84  Pulse:  85  Resp:  18  SpO2:  100%  Weight: 149 lb (67.6 kg)    Physical Exam Cardiovascular:     Rate and Rhythm: Normal rate and regular rhythm.     Heart sounds: Normal heart sounds.  Pulmonary:     Effort: Pulmonary effort is normal.     Breath sounds: Normal breath sounds.  Abdominal:     General: Bowel sounds are normal.     Palpations: Abdomen is soft.  Musculoskeletal:     Comments: Dressing in place over left lower extremity  Skin:    General: Skin is warm and dry.  Neurological:     Mental Status: She is alert and oriented to person, place, and time.         Latest Ref Rng & Units 12/25/2022    9:57 AM  CMP  Creatinine 0.44 - 1.00  mg/dL 1.40       Latest Ref Rng & Units 11/14/2022    8:55 AM  CBC  WBC 4.0 - 10.5 K/uL 4.5   Hemoglobin 12.0 - 15.0 g/dL 10.1   Hematocrit 36.0 - 46.0 % 33.6   Platelets 150 - 400 K/uL 317     No images are attached to the encounter.  NM Bone Scan Whole Body  Result Date: 12/25/2022 CLINICAL DATA:  Metastatic breast cancer to bone EXAM: NUCLEAR MEDICINE WHOLE BODY BONE SCAN TECHNIQUE: Whole body anterior and posterior images were obtained approximately 3 hours after intravenous injection of radiopharmaceutical. RADIOPHARMACEUTICALS:  23.5 mCi Technetium-19mMDP IV COMPARISON:  05/05/2022 Correlation: CT chest abdomen pelvis 12/25/2022 FINDINGS: Uptake at shoulders RIGHT greater than LEFT, sternoclavicular joints, elbows,  hips, and extensively at RIGHT knee, favoring degenerative changes. Significant uptake in upper thoracic and lower thoracic spine corresponding to sclerotic metastatic disease on CT; the sites were seen on the previous exam as well, pattern unchanged. Uptake along the RIGHT lateral border of the upper lumbar spine, corresponding to degenerative changes and scoliosis seen on CT, unchanged. Uptake at mandible and maxilla is nonspecific, can be seen with dental extractions and dental disease, question patient having dentures. No definite new sites of abnormal tracer uptake are seen to suggest new osseous metastases. Expected urinary tract and soft tissue distribution of tracer. IMPRESSION: Osseous metastatic disease involving the thoracic spine. Scattered degenerative type uptake of tracer, stable. No new scintigraphic abnormalities. Electronically Signed   By: MLavonia DanaM.D.   On: 12/25/2022 15:15   CT CHEST ABDOMEN PELVIS W CONTRAST  Result Date: 12/25/2022 CLINICAL DATA:  Metastatic breast cancer restaging * Tracking Code: BO * EXAM: CT CHEST, ABDOMEN, AND PELVIS WITH CONTRAST TECHNIQUE: Multidetector CT imaging of the chest, abdomen and pelvis was performed following the standard protocol during bolus administration of intravenous contrast. RADIATION DOSE REDUCTION: This exam was performed according to the departmental dose-optimization program which includes automated exposure control, adjustment of the mA and/or kV according to patient size and/or use of iterative reconstruction technique. CONTRAST:  858mOMNIPAQUE IOHEXOL 300 MG/ML  SOLN COMPARISON:  08/24/2022 FINDINGS: CT CHEST FINDINGS Cardiovascular: Left chest port catheter. 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 Scattered aortic atherosclerosis. Normal heart size. No pericardial effusion. Mediastinum/Nodes: No enlarged mediastinal, hilar, or  axillary lymph nodes. Thyroid gland, trachea, and esophagus demonstrate no significant findings. Lungs/Pleura: Mild subpleural radiation fibrosis of the anterior right lung (series 4, image 58). Unchanged 0.5 cm subpleural nodule of the azygoesophageal recess of the medial right lower lobe (series 4, image 60). No pleural effusion or pneumothorax. Musculoskeletal: No chest wall abnormality. No acute osseous findings. CT ABDOMEN PELVIS FINDINGS Hepatobiliary: No solid liver abnormality is seen. No gallstones or gallbladder wall thickening. Unchanged common bile duct dilatation without intrahepatic biliary ductal dilatation, the common bile duct measuring up to 1.0 cm (series 5, image 47). Pancreas: Unremarkable. No pancreatic ductal dilatation or surrounding inflammatory changes. Spleen: Normal in size without significant abnormality. Adrenals/Urinary Tract: Adrenal glands are unremarkable. Kidneys are normal, without renal calculi, solid lesion, or hydronephrosis. Bladder is unremarkable. Stomach/Bowel: Stomach is within normal limits. Appendix appears normal. No evidence of bowel wall thickening, distention, or inflammatory changes. Large burden of stool and stool balls throughout the colon and rectum. Vascular/Lymphatic: Aortic atherosclerosis. No significant change in enlarged bilateral inguinal lymph nodes, largest, incompletely imaged left inguinal node measuring up to  2.1 x 1.6 cm (series 2, image 108). Reproductive: No mass or other abnormality. Other: No abdominal wall hernia or abnormality. No ascites. Musculoskeletal: No acute osseous findings. Unchanged sclerotic osseous metastatic lesions throughout the included axial and proximal appendicular skeleton. IMPRESSION: 1. Unchanged sclerotic osseous metastatic lesions throughout the included axial and proximal appendicular skeleton. 2. No significant change in enlarged bilateral inguinal lymph nodes. No new lymphadenopathy. 3. Unchanged 0.5 cm right lower  lobe pulmonary nodule. No new nodules. 4. Unchanged chronic left subclavian venous stenosis about a port catheter. 5. Unchanged common bile duct dilatation without intrahepatic biliary ductal dilatation. 6. Large burden of stool and stool balls. Aortic Atherosclerosis (ICD10-I70.0). Electronically Signed   By: Delanna Ahmadi M.D.   On: 12/25/2022 13:12     Assessment and plan- Patient is a 77 y.o. 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  Patient was on maintenance Herceptin and Perjeta with last dose given in October 2023.  She is here to discuss CT scan results and further management  I have reviewed CT chest abdomen and pelvis images independently and bone scan as well and discussed findings with the patient which shows stable areas of bony metastatic disease.  She also has bilateral inguinal adenopathy which has remained stable.  Patient tells me that she has had these lymph nodes for many years and even had her biopsy when she was first diagnosed with breast cancer back in 2014.  In the absence of overt progression I do not see any reason to repeat biopsy.  For stage IV HER2 positive breast cancer she needs to be on some kind of treatment until progression or toxicity.  She has ER negative disease and therefore not a candidate for endocrine therapy.  I would consider switching her to lapatinib which is relatively less cardiotoxic as compared to others.  However I will wait to get further input from Dr. Harrell Gave who she is again seen for a virtual visit in 2 weeks.  She was started on Jardiance by her.  I would favor getting an echocardiogram in about 6 weeks time and if her EF is overall stable I would like to proceed with treatment but again I am waiting for cardiology input at this time.  With regards to Adderall she has been on this drug for over 15 years she says.  I was only continuing her old outpatient medications and if she needs to come  off Adderall as per cardiology I am okay with that.  Patient would like to stay on the drug if possible but if cardiology feels that she really needs to come off that medication she is willing to proceed with that.  I will see her back in 6 weeks with labs    Visit Diagnosis 1. Primary malignant neoplasm of breast with metastasis ([place])   2. Goals of care, counseling/discussion      Dr. Randa Evens, MD, MPH [facility] at [facility] [phone] 01/03/2023 4:24 PM

## 2023-01-04 NOTE — Progress Notes (Signed)
Laura Santiago ([id number]) [id number] Page 1 of 15 Visit Report for 01/03/2023 Chief Complaint Document Details Patient Name: Date of Service: Laura Santiago. 01/03/2023 10:00 [place] Record Number: [id number] Patient Account Number: [id number] Date of Birth/Sex: Treating RN: Jan 16, 1946 (77 y.o. [name] Primary Care Provider: [place] Other Clinician: [name] Referring Provider: Treating Provider/Extender: [name] Weeks in Treatment: 65 Information Obtained from: Patient Chief Complaint Left lower extremity wound Right toe wounds Left upper lateral thigh wounds Electronic Signature(s) Signed: 01/03/2023 3:12:59 PM By: Kalman Shan DO Entered By: Kalman Shan on 01/03/2023 12:20:39 -------------------------------------------------------------------------------- Debridement Details Patient Name: Date of Service: Laura Santiago Laura M. 01/03/2023 10:00 [place] Record Number: [id number] Patient Account Number: [id number] Date of Birth/Sex: Treating RN: 1946/02/21 (77 y.o. [name] Primary Care Provider: [place] Other Clinician: [name] Referring Provider: Treating Provider/Extender: [name] Weeks in Treatment: 78 Debridement Performed for Assessment: Wound #1 Left,Medial Lower Leg Performed By: Physician Kalman Shan, MD Debridement Type: Debridement Severity of Tissue Pre Debridement: Fat layer exposed Level of Consciousness (Pre-procedure): Awake and Alert Pre-procedure Verification/Time Out Yes - 10:34 Taken: Start Time: 10:34 T Area Debrided (L x W): otal 1 (cm) x 1 (cm) = 1 (cm) Tissue and other material debrided: Viable, Non-Viable, Slough, Subcutaneous, Slough Level: Skin/Subcutaneous Tissue Debridement Description: Excisional Instrument: Curette Bleeding: Minimum Hemostasis Achieved: Pressure Response  to Treatment: Procedure was tolerated well Level of Consciousness (Post- Awake and Alert Laura Santiago ([id number]) [id number] Page 2 of 15 Awake and Alert procedure): Post Debridement Measurements of Total Wound Length: (cm) 5.4 Width: (cm) 0.7 Depth: (cm) 0.1 Volume: (cm) 0.297 Character of Wound/Ulcer Post Debridement: Stable Severity of Tissue Post Debridement: Fat layer exposed Post Procedure Diagnosis Same as Pre-procedure Electronic Signature(s) Signed: 01/03/2023 3:12:59 PM By: Kalman Shan DO Signed: 01/03/2023 5:30:54 PM By: [name] Signed: 01/03/2023 5:47:34 PM By: Gretta Cool, BSN, RN, CWS, [name] RN, BSN Entered By: [name] on 01/03/2023 10:37:43 -------------------------------------------------------------------------------- Debridement Details Patient Name: Date of Service: Laura Santiago Laura M. 01/03/2023 10:00 [place] Record Number: [id number] Patient Account Number: [id number] Date of Birth/Sex: Treating RN: September 18, 1946 (77 y.o. [name] Primary Care Provider: [place] Other Clinician: [name] Referring Provider: Treating Provider/Extender: [name] Weeks in Treatment: 78 Debridement Performed for Assessment: Wound #8 Left,Lateral Lower Leg Performed By: Physician Kalman Shan, MD Debridement Type: Debridement Severity of Tissue Pre Debridement: Fat layer exposed Level of Consciousness (Pre-procedure): Awake and Alert Pre-procedure Verification/Time Out Yes - 10:34 Taken: Start Time: 10:34 T Area Debrided (L x W): otal 0.5 (cm) x 0.5 (cm) = 0.25 (cm) Tissue and other material debrided: Viable, Non-Viable, Slough, Subcutaneous, Slough Level: Skin/Subcutaneous Tissue Debridement Description: Excisional Instrument: Curette Bleeding: Minimum Hemostasis Achieved: Pressure Response to Treatment: Procedure was tolerated well Level of Consciousness (Post- Awake  and Alert procedure): Post Debridement Measurements of Total Wound Length: (cm) 1.5 Width: (cm) 2 Depth: (cm) 0.1 Volume: (cm) 0.236 Character of Wound/Ulcer Post Debridement: Stable Severity of Tissue Post Debridement: Fat layer exposed Post Procedure Diagnosis Same as Pre-procedure Electronic Signature(s) Signed: 01/03/2023 3:12:59 PM By: Kalman Shan DO Signed: 01/03/2023 5:30:54 PM By: Theodora Blow ([id number]) [id number] Page 3 of 15 Signed: 01/03/2023 5:47:34 PM By: Gretta Cool, BSN, RN, CWS, [name] RN, BSN Entered By: [name] on 01/03/2023 10:39:15 -------------------------------------------------------------------------------- HPI Details Patient Name:  Date of Service: Laura Santiago. 01/03/2023 10:00 A M Medical Record Number: [id number] Patient Account Number: [id number] Date of Birth/Sex: Treating RN: 1946-07-10 (77 y.o. [name] Primary Care Provider: [place] Other Clinician: [name] Referring Provider: Treating Provider/Extender: Kalman Shan [facility] Weeks in Treatment: 74 History of Present Illness HPI Description: Admission 7/27 Ms. [name] is a 77 year old female with a past medical history of ADHD, metastatic breast cancer, stage IV chronic kidney disease, history of DVT on Xarelto and chronic venous insufficiency that presents to the clinic for a chronic left lower extremity wound. She recently moved to [facility] 4 days ago. She was being followed by wound care center in [name]. She reports a 10-year history of wounds to her left lower extremity that eventually do heal with debridement and compression therapy. She states that the current wound reopened 4 months ago and she is using Vaseline and Coban. She denies signs of infection. 8/3; patient presents for 1 week follow-up. She reports no issues or complaints today. She states she had vascular  studies done in the last week. She denies signs of infection. She brought her little service dog with her today. 8/17; patient presents for follow-up. She has missed her last clinic appointment. She states she took the wrap off and attempted to rewrap her leg. She is having difficulty with transportation. She has her service dog with her today. Overall she feels well and reports improvement in wound healing. She denies signs of infection. She reports owning an old Velcro wrap compression and has this at her living facility 9/14; patient presents for follow-up. Patient states that over the past 2 to 3 weeks she developed toe wounds to her right foot. She attributes this to tight fitting shoes. She subsequently developed cellulitis in the right leg and has been treated by doxycycline by her oncologist. She reports improvement in symptoms however continues to have some redness and swelling to this leg. T the left lower extremity patient has been having her wraps changed with home health twice weekly. She states that the [facility] is not helping control o the drainage. Other than that she has no issues or complaints today. She denies signs of infection to the left lower extremity. 9/21; patient presents for follow-up. She reports seeing infectious disease for her cellulitis. She reports no further management. She has home health that changes the wraps twice weekly. She has no issues or complaints today. She denies signs of infection. 10/5; patient presents for follow-up. She has no issues or complaints today. She denies signs of infection. She states that the right great toe has not been dressed by home health. 10/12; patient presents for follow-up. She has no issues or complaints today. She reports improvement in her wound healing. She has been using silver alginate to the right great toe wound. She denies signs of infection. 10/26; patient presents for follow-up. Home health did not have sorbact  so they continued to use Hydrofera Blue under the wrap. She has been using silver alginate to the great toe wound however she did not have a dressing in place today. She currently denies signs of infection. 11/2; patient presents for follow-up. She has been using sorb act under the compression wrap. She reports using silver alginate to the toe wound again she does not have a dressing in place. She currently denies signs of infection. 11/23; patient presents for follow-up. Unfortunately she has missed her last 2 clinic  appointments. She was last seen 3 weeks ago. She did her own compression wrap with Kerlix and Coban yesterday after seeing vein and vascular. She has not been dressing her right great toe wound. She currently denies signs of infection. 11/30; patient presents for 1 week follow-up. She states she changed her dressing last week prior to home health and use sorb act with Dakin's and Hydrofera Blue. Home health has changed the dressing as well and they have been using sorbact. T oday she reports increased redness to her right lower extremity. She has a history of cellulitis to this leg. She has been using silver alginate to the right great toe. Unfortunately she had an episode of diarrhea prior to coming in and had feces all over the right leg and to the wrap of her left leg. 12/7; patient presents for 1 week follow-up. She states that home health did not come out to change the dressing and she took it off yesterday. It is unclear if she is dressing the right toe wound. She denies signs of infection. 12/14; patient presents for 1 week follow-up. She has no issues or complaints today. 12/21; patient presents for follow-up. She has no issues or complaints today. She denies signs of infection. 12/28/2021; patient presents for follow-up. She was hospitalized for sepsis secondary to right lower extremity cellulitis On 12/23. She states she is currently at a SNF. She states that she was started on  doxycycline this morning for her right great toe swelling and redness. She is not sure what dressings have been done to her left lower extremity for the past 3 weeks. She says its been mainly gauze with an Santiago wrap. 1/25; patient presents for follow-up. She is still residing in a skilled nursing facility. She reports mild pain to the left lower extremity wound bed. She states she is going to see a podiatrist soon. Laura Santiago, Laura Santiago ([id number]) [id number] Page 4 of 15 2/8; patient presents for follow-up. She has moved back to her residential community from her skilled nursing facility. She has no issues or complaints today. She denies signs of systemic infections. 2/15; patient presents for follow-up. He has no issues or complaints today. She denies systemic signs of infection. 2/22; patient presents for follow-up. She has no issues or complaints today. She denies signs of infection. 3/1; patient presents for follow-up. She states that home health came out the day after she was seen in our clinic and yesterday to do the wrap change. She denies signs of infection. She reports excoriated skin on the ankle. 3/8; patient presents for follow-up. She has no issues or complaints today. She denies signs of infection. 3/15; patient presents for follow-up. Home health has been coming out to change the dressings. She reports more tenderness to the wound site. She denies purulent drainage, increased warmth or erythema to the area. 4/5; patient presents for follow-up. She has missed her last 2 clinic appointments. I have not seen her in 3 weeks. She was recently hospitalized for altered mental status. She was involuntarily committed. She was evaluated by psychiatry and deemed to have competency. There was no specific cause of her altered mental status. It was concluded that her physical and mental health were declining due to her chronic medical conditions. Currently home health  has been coming out for dressing changes. Patient has also been doing her own dressing changes. She reports more skin breakdown to the periwound and now has a new wound. She denies fever/chills. She reports continued tenderness to  the wound site. 4/12; patient with significant venous insufficiency and a large wound on her left lower leg taking up about 80% of the circumference of her lower leg. Cultures of this grew MRSA and Pseudomonas. She had completed a course of ciprofloxacin now is starting doxycycline. She has been using Dakin's wet-to-dry and a Tubigrip. She has home health twice a week and we change it once. 4/19; patient presents for follow-up. She completed her course of doxycycline. She has been using Dakin's wet-to-dry dressing and Tubigrip. Home health changes the dressing twice weekly. Currently she has no issues or complaints. 4/26; patient presents for follow-up. At last clinic visit orders for home health were Iodosorb under compression therapy. Unfortunately they did not have the dressing and have been using Dakin's and gentamicin under the wrap. Patient currently denies signs of infection. She has no issues or complaints today. 5/3; patient presents for follow-up. Again Iodosorb has not been used under the compression therapy when home health comes out to change the wrap and dressing. They have been using Sorbact. It is unclear why this is happening since we send orders weekly to the agency. She denies signs of infection. Patient has not purchased the [facility] antibiotics. We reached out to the company and they said they have been trying to contact her on a regular basis. We gave the patient the number to call to order the medication. 5/10; patient presents for follow-up. She has no issues or complaints today. Again home health has not been using Iodosorb. Mepilex was on the wound bed. No other dressings noted. She brought in her Keystone antibiotics. She denies signs of  infection. 5/17; patient presents for follow-up. Home health has come out twice since she was last seen. Joint well she has been using Keystone antibiotic with Sorbact under the compression wrap. She has no issues or complaints today. She denies signs of infection. 5/24; patient presents for follow-up. We have been using Keystone antibiotics with Sorbact under compression therapy. She is tolerating the treatment well. She is reporting improvement in wound healing. She denies signs of infection. 5/31; patient presents for follow-up. We continue to do [facility] antibiotics with Sorbact under compression therapy. She continues to report improvement in wound healing. Home health comes out and changes the dressing once weekly. 05-17-2022 upon evaluation today patient appears to be doing better in regard to her wound especially compared to the last time I saw her. Fortunately I do think that she is seeing improvements. With that being said I do believe that she may be benefit from sharp debridement today to clear away some of the necrotic debris I discussed that with her as well. She is an amendable to that plan. Otherwise she is very pleased with how the [employer] is doing for her. 6/14; patient presents for follow-up. We have been using Keystone antibiotic with Sorbact and absorbent dressings under 3 layer compression. She has no issues or complaints today. She reports improvement in wound healing. She denies signs of infection. 6/21; patient presents for follow-up. We are continuing with [facility] antibiotic and Sorbact under 3 layer compression. Patient has no complaints. Continued wound healing is happening. She denies signs of infection. 6/28; patient presents for follow-up. We have been using Keystone antibiotic with Sorbact under 3 layer compression. Usually home health comes out and changes the dressing twice a week. Unfortunately they did not go out to change the dressing. It is unclear why. Patient  did not call them. She currently denies signs of infection.  7/5; patient presents for follow-up. We have been using Keystone antibiotic with calcium alginate under 3 layer compression. She reports improvement in wound healing. She denies signs of infection. Home health has come out to do dressing changes twice this past week. 7/12; patient presents for follow-up. We have been using Keystone antibiotic with calcium alginate under 3 layer compression. Patient states that home health came out once last week to change the dressing. She reports improvement in wound healing. She currently denies signs of infection. 7/19; patient presents for follow-up. We have been using Keystone antibiotic with calcium alginate under 3 layer compression. Home health came out once last week to change the dressing. She has no issues or complaints today. She denies signs of infection. 8/2; patient presents for follow-up. We have been using Keystone antibiotic with calcium alginate under 3 layer compression. Unfortunately she missed her appointment last week and home health did not come out to do dressing changes. Patient currently denies signs of infection. 8/9; patient presents for follow-up. We have been using Keystone with calcium alginate under 3 layer compression. She states that home health came out once last week. She currently denies signs of infection. Her wrap was completely wet. She states she was cleaning the top of the leg and water soaked down into the wrap. 8/16; patient presents for follow-up. We have been using Keystone with calcium alginate under 3 layer compression. She states that home health came out twice last week. She has no issues or complaints today. 8/23; patient presents for follow-up. He has been using Keystone with calcium alginate under 3 layer compression. Home health came out twice last week. She denies signs of infection. 8/30; patient presents for follow-up. We have been using Keystone  with calcium alginate under 3 layer compression. Home health came out once last week to change the dressing. Patient reports improvement in wound healing. She states she is almost done with her chemotherapy infusions and has 1 more left. 9/13; patient presents for follow-up. She has lost the capsules to her [facility] antibiotic which I believe is the vancomycin pills. She has her Zosyn powder today. We have been using Keystone antibiotic ointment with calcium alginate under 3 layer compression. She is concerned about systemic infection however her vitals are stable and there is no surrounding soft tissue infection. She would like to remain a patient in our wound care center however would like a second opinion for her wound care at another facility. She asked to be referred to [facility] wound care center. EMONI, WHITWORTH ([id number]) [id number] Page 5 of 15 9/20; patient presents for follow-up. She found her vancomycin capsules and brought in her complete Keystone antibiotic ointment set today. Unfortunately she has developed skin breakdown and Erythema to the right lower extremity With increased swelling. She states she went to a pow wow Over the weekend and was on her feet for extended periods of time. She saw her oncologist yesterday who prescribed her doxycycline for her right lower extremity erythema. 9/27; patient presents for follow-up. We have been using Keystone antibiotic with Aquacel under 3 layer compression to the lower extremities bilaterally. When home health came and changed the wrap she secretly put coffee into the spray mix along with [facility] antibiotic on her leg thinking the acidic component would better activate the zoysn (sonething she discussed with her microbiologist brother). She has reported improvement in wound healing. 10/4; patient presents for follow-up. She has no issues or complaints today. We have been doing Aquacel and keystone  under 3 layer  compression to the lower extremities bilaterally. This morning she took the right lower extremity wrap off as it was uncomfortable. She has no open wounds to this leg. 10/11; patient presents for follow-up. We have been doing Aquacel with Keystone antibiotic ointment under 3 layer compression to the left lower extremity. She developed a small blister to the anterior aspect of the left leg noticed when the wrap was taken off on intake. She currently denies signs of infection. 10/18; patient presents for follow-up. We have been doing Aquacel with Keystone antibiotic ointment under 3 layer compression to the left lower extremity. There has been continued improvement in wound healing. She denies signs of infection. 10/25; patient arrives for treatment of venous insufficiency ulcers on her left lower leg both lateral and medial are remanence of apparently a circumferential wound. Much improved. We are using topicals Keystone and Aquacel Ag under 3 layer compression we continue to make good progress. The patient talk to me at some length with regards to different things she has on her forehead and her Peri orbital area for which she is apparently applying [place]. She feels that what ever we are treating on her wounds is a more systemic problem. I really was not able to get a handle on what she is talking about however I did caution her not to put the [place] in her eyes. 11/1; her wounds continue to improve she is using Keystone and Aquacel Ag G under 3 layer compression. Our intake nurse notes erythema and edema in the right leg. The patient has a litany of concerns with regards to a rash on her forehead or ears and other systemic complaints. She has an appointment with dermatology on November 11 11/8; patient presents for follow-up. We have been using Keystone and Aquacel under 4-layer compression. She has no issues or complaints today. She reports improvement in wound healing. 11/15; patient presents  for follow-up. We have been using Aquacel with Keystone antibiotic under 3 layer compression. Patient continues improvement in wound healing. 12/6; patient presents for follow-up. We have been using Aquacel with Keystone antibiotic ointment under 3 layer compression. Wounds appear well-healing. 12/13; patient presents for follow-up. We have been using Aquacel with Keystone antibiotic under 3 layer compression. She has no issues or complaints today. 12/27 left lateral medial ankle. Superficial wounds remain there is significantly improved we are using Keystone backed with Zetuvit under 4-layer compression 1/3; patient presents for follow-up. Her wounds appear well-healing. We have been using Aquacel Ag with Keystone antibiotic ointment under compression therapy. This should be a 3 layer compression. 1/17; patient presents for follow-up. Her wounds on her left lower extremity are well-healing. We are using Aquacel Ag and Keystone antibiotic ointment under compression therapy. She missed her last clinic appointment. She states that the wrap has been changed twice weekly since she was last seen. Unfortunately she has developed increased warmth and redness to the right lower extremity consistent with cellulitis. She states this started a few days ago. 1/24; patient presents for follow-up. Her wounds on the left lower extremity are well-healing with Aquacel and Keystone antibiotic ointment under compression therapy. I prescribed Keflex at last clinic visit for cellulitis of the right lower extremity. Unfortunately this has not resolved. She did not follow-up with her PCP for contact our office about her symptoms. Electronic Signature(s) Signed: 01/03/2023 3:12:59 PM By: Kalman Shan DO Entered By: Kalman Shan on 01/03/2023 12:22:44 -------------------------------------------------------------------------------- Physical Exam Details Patient Name: Date of Service: [name],  [place] Laura M. 01/03/2023  10:00 A M Medical Record Number: [id number] Patient Account Number: [id number] Date of Birth/Sex: Treating RN: [birth year] (77 y.o. [name] Primary Care Provider: [place] Other Clinician: [name] Referring Provider: Treating Provider/Extender: Kalman Shan [facility] Weeks in Treatment: 64 Constitutional . Cardiovascular . ALIVIYAH, MALANGA ([id number]) [id number] Page 6 of 15 Psychiatric . Notes T the left lower extremity there are areas with nonviable tissue. Postdebridement there are a few small scattered wounds to the lateral and medial aspect with o granulation tissue. No signs of infection to the sites. T the right lower extremity there is increased warmth and erythema throughout the leg that stops below o the knee. No purulent drainage. Electronic Signature(s) Signed: 01/03/2023 3:12:59 PM By: Kalman Shan DO Entered By: Kalman Shan on 01/03/2023 12:23:24 -------------------------------------------------------------------------------- Physician Orders Details Patient Name: Date of Service: Laura Santiago Laura M. 01/03/2023 10:00 A M Medical Record Number: [id number] Patient Account Number: [id number] Date of Birth/Sex: Treating RN: Jul 05, 1946 (77 y.o. [name] Primary Care Provider: [place] Other Clinician: [name] Referring Provider: Treating Provider/Extender: [name] Weeks in Treatment: 21 Verbal / Phone Orders: No Diagnosis Coding Follow-up Appointments Return Appointment in 1 week. Nurse Visit as needed [place]: Rocky Morel [phone] [place] for wound care. May utilize formulary equivalent dressing for wound treatment orders unless otherwise specified. Home Health Nurse may visit PRN to address patients wound care needs. - home health to see patient 2 times per week, patient to be seen at wound  clinic once per week **Please direct any NON-WOUND related issues/requests for orders to patient's Primary Care Physician. **If current dressing causes regression in wound condition, may D/C ordered dressing product/s and apply Normal Saline Moist Dressing daily until next Impact or Other MD appointment. **Notify [facility] of regression in wound condition at [phone]. Other Home Health Orders/Instructions: Bathing/ Shower/ Hygiene May shower with wound dressing protected with water repellent cover or cast protector. No tub bath. Anesthetic (Use 'Patient Medications' Section for Anesthetic Order Entry) Lidocaine applied to wound bed Edema Control - Lymphedema / Segmental Compressive Device / Other Optional: One layer of unna paste to top of compression wrap (to act as an anchor). Elevate, Exercise Daily and A void Standing for Long Periods of Time. Elevate legs to the level of the heart and pump ankles as often as possible Elevate leg(s) parallel to the floor when sitting. DO YOUR BEST to sleep in the bed at night. DO NOT sleep in your recliner. Long hours of sitting in a recliner leads to swelling of the legs and/or potential wounds on your backside. Additional Orders / Instructions Follow Nutritious Diet and Increase Protein Intake Other: - recommend going to ED for cellulitis in right lower leg if no better in 2 days Medications-Please add to medication list. Keystone Compound ntibiotics - start Doxycycline right leg cellulitis P.O. A Wound Treatment Wound #1 - Lower Leg Wound Laterality: Left, Medial Cleanser: Wound Cleanser 3 x Per Week/30 Days FEIGE, LOWDERMILK ([id number]) [id number] Page 7 of 15 Discharge Instructions: Wash your hands with soap and water. Remove old dressing, discard into plastic bag and place into trash. Cleanse the wound with Wound Cleanser prior to applying a clean dressing using gauze sponges, not  tissues or cotton balls. Do not scrub or use excessive force. Pat dry using gauze sponges, not tissue or cotton balls.  Topical: Triamcinolone Acetonide Cream, 0.1%, 15 (g) tube 3 x Per Week/30 Days Discharge Instructions: lower leg Prim Dressing: AquacelAg Advantage Dressing, 2X2 (in/in) 3 x Per Week/30 Days ary Discharge Instructions: Apply to wound as directed Secondary Dressing: Zetuvit Plus 4x8 (in/in) 3 x Per Week/30 Days Compression Wrap: Medichoice 4 layer Compression System, 35-40 mmHG 3 x Per Week/30 Days Discharge Instructions: Apply multi-layer wrap as directed. Wound #8 - Lower Leg Wound Laterality: Left, Lateral Cleanser: Wound Cleanser 3 x Per Week/30 Days Discharge Instructions: Wash your hands with soap and water. Remove old dressing, discard into plastic bag and place into trash. Cleanse the wound with Wound Cleanser prior to applying a clean dressing using gauze sponges, not tissues or cotton balls. Do not scrub or use excessive force. Pat dry using gauze sponges, not tissue or cotton balls. Topical: Triamcinolone Acetonide Cream, 0.1%, 15 (g) tube 3 x Per Week/30 Days Discharge Instructions: Mix 1:1 with Nystatin cream and apply to periwound and leg Prim Dressing: AquacelAg Advantage Dressing, 2X2 (in/in) 3 x Per Week/30 Days ary Discharge Instructions: Apply to wound as directed Secondary Dressing: Zetuvit Plus 4x8 (in/in) 3 x Per Week/30 Days Compression Wrap: Medichoice 4 layer Compression System, 35-40 mmHG 3 x Per Week/30 Days Discharge Instructions: Apply multi-layer wrap as directed. Patient Medications llergies: Celebrex A Notifications Medication Indication Start End 01/03/2023 clindamycin HCl DOSE 1 - oral 300 mg capsule - 1 capsule oral four times daily x 7 days Electronic Signature(s) Signed: 01/03/2023 3:12:59 PM By: Kalman Shan DO Previous Signature: 01/03/2023 10:48:04 AM Version By: Kalman Shan DO Entered By: Kalman Shan on 01/03/2023  12:25:57 -------------------------------------------------------------------------------- Problem List Details Patient Name: Date of Service: Laura Santiago Laura M. 01/03/2023 10:00 A M Medical Record Number: [id number] Patient Account Number: [id number] Date of Birth/Sex: Treating RN: Nov 18, 1946 (77 y.o. [name] Primary Care Provider: [place] Other Clinician: [name] Referring Provider: Treating Provider/Extender: Kalman Shan [facility] Weeks in Treatment: 61 Active Problems ICD-10 Encounter Code Description Active Date MDM Diagnosis LIRIDONA, MASHAW ([id number]) [id number] Page 8 of 15 [id number] Non-pressure chronic ulcer of other part of left lower leg with fat layer exposed11/23/2022 No Yes 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 11/02/2021 No Yes I87.311 Chronic venous hypertension (idiopathic) with ulcer of right lower extremity 08/30/2022 No Yes I87.2 Venous insufficiency (chronic) (peripheral) 07/06/2021 No Yes Z79.01 Long term (current) use of anticoagulants 07/06/2021 No Yes I10 Essential (primary) hypertension 07/06/2021 No Yes 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 07/06/2021 No Yes L03.115 Cellulitis of right lower limb 12/27/2022 No Yes Inactive Problems ICD-10 Code Description Active Date Inactive Date S81.802A Unspecified open wound, left lower leg, initial encounter 07/06/2021 07/06/2021 S91.101A Unspecified open wound of right great toe without damage to nail, initial encounter 08/24/2021 08/24/2021 S91.104A Unspecified open wound of right lesser toe(s) without damage to nail, initial encounter 08/24/2021 08/24/2021 Resolved Problems ICD-10 Code Description Active Date Resolved Date S91.104D Unspecified open wound of right lesser toe(s) without damage to nail, subsequent 08/31/2021 08/31/2021 encounter S91.201D Unspecified open wound of right great toe with  damage to nail, subsequent encounter 08/31/2021 08/31/2021 Electronic Signature(s) Signed: 01/03/2023 3:12:59 PM By: Kalman Shan DO Entered By: Kalman Shan on 01/03/2023 12:20:35 [name] ([id number]) [id number] Page 9 of 15 -------------------------------------------------------------------------------- Progress Note Details Patient Name: Date of Service: Laura Santiago. 01/03/2023 10:00 A M Medical Record Number: [id number] Patient Account Number: [id number] Date of Birth/Sex: Treating RN: 09/17/1946 (  77 y.o. [name] Primary Care Provider: [place] Other Clinician: [name] Referring Provider: Treating Provider/Extender: [name] Weeks in Treatment: 36 Subjective Chief Complaint Information obtained from Patient Left lower extremity wound Right toe wounds Left upper lateral thigh wounds History of Present Illness (HPI) Admission 7/27 Ms. Krissia Schreier is a 77 year old female with a past medical history of ADHD, metastatic breast cancer, stage IV chronic kidney disease, history of DVT on Xarelto and chronic venous insufficiency that presents to the clinic for a chronic left lower extremity wound. She recently moved to [facility] 4 days ago. She was being followed by wound care center in [name]. She reports a 10-year history of wounds to her left lower extremity that eventually do heal with debridement and compression therapy. She states that the current wound reopened 4 months ago and she is using Vaseline and Coban. She denies signs of infection. 8/3; patient presents for 1 week follow-up. She reports no issues or complaints today. She states she had vascular studies done in the last week. She denies signs of infection. She brought her little service dog with her today. 8/17; patient presents for follow-up. She has missed her last clinic appointment. She states she took  the wrap off and attempted to rewrap her leg. She is having difficulty with transportation. She has her service dog with her today. Overall she feels well and reports improvement in wound healing. She denies signs of infection. She reports owning an old Velcro wrap compression and has this at her living facility 9/14; patient presents for follow-up. Patient states that over the past 2 to 3 weeks she developed toe wounds to her right foot. She attributes this to tight fitting shoes. She subsequently developed cellulitis in the right leg and has been treated by doxycycline by her oncologist. She reports improvement in symptoms however continues to have some redness and swelling to this leg. T the left lower extremity patient has been having her wraps changed with home health twice weekly. She states that the [facility] is not helping control o the drainage. Other than that she has no issues or complaints today. She denies signs of infection to the left lower extremity. 9/21; patient presents for follow-up. She reports seeing infectious disease for her cellulitis. She reports no further management. She has home health that changes the wraps twice weekly. She has no issues or complaints today. She denies signs of infection. 10/5; patient presents for follow-up. She has no issues or complaints today. She denies signs of infection. She states that the right great toe has not been dressed by home health. 10/12; patient presents for follow-up. She has no issues or complaints today. She reports improvement in her wound healing. She has been using silver alginate to the right great toe wound. She denies signs of infection. 10/26; patient presents for follow-up. Home health did not have sorbact so they continued to use Hydrofera Blue under the wrap. She has been using silver alginate to the great toe wound however she did not have a dressing in place today. She currently denies signs of infection. 11/2;  patient presents for follow-up. She has been using sorb act under the compression wrap. She reports using silver alginate to the toe wound again she does not have a dressing in place. She currently denies signs of infection. 11/23; patient presents for follow-up. Unfortunately she has missed her last 2 clinic appointments. She was last seen 3 weeks ago.  She did her own compression wrap with Kerlix and Coban yesterday after seeing vein and vascular. She has not been dressing her right great toe wound. She currently denies signs of infection. 11/30; patient presents for 1 week follow-up. She states she changed her dressing last week prior to home health and use sorb act with Dakin's and Hydrofera Blue. Home health has changed the dressing as well and they have been using sorbact. T oday she reports increased redness to her right lower extremity. She has a history of cellulitis to this leg. She has been using silver alginate to the right great toe. Unfortunately she had an episode of diarrhea prior to coming in and had feces all over the right leg and to the wrap of her left leg. 12/7; patient presents for 1 week follow-up. She states that home health did not come out to change the dressing and she took it off yesterday. It is unclear if she is dressing the right toe wound. She denies signs of infection. 12/14; patient presents for 1 week follow-up. She has no issues or complaints today. 12/21; patient presents for follow-up. She has no issues or complaints today. She denies signs of infection. 12/28/2021; patient presents for follow-up. She was hospitalized for sepsis secondary to right lower extremity cellulitis On 12/23. She states she is currently at a SNF. She states that she was started on doxycycline this morning for her right great toe swelling and redness. She is not sure what dressings have been done to her left lower extremity for the past 3 weeks. She says its been mainly gauze with an Santiago  wrap. 1/25; patient presents for follow-up. She is still residing in a skilled nursing facility. She reports mild pain to the left lower extremity wound bed. She states she is going to see a podiatrist soon. Laura Santiago, Laura Santiago ([id number]) [id number] Page 10 of 15 2/8; patient presents for follow-up. She has moved back to her residential community from her skilled nursing facility. She has no issues or complaints today. She denies signs of systemic infections. 2/15; patient presents for follow-up. He has no issues or complaints today. She denies systemic signs of infection. 2/22; patient presents for follow-up. She has no issues or complaints today. She denies signs of infection. 3/1; patient presents for follow-up. She states that home health came out the day after she was seen in our clinic and yesterday to do the wrap change. She denies signs of infection. She reports excoriated skin on the ankle. 3/8; patient presents for follow-up. She has no issues or complaints today. She denies signs of infection. 3/15; patient presents for follow-up. Home health has been coming out to change the dressings. She reports more tenderness to the wound site. She denies purulent drainage, increased warmth or erythema to the area. 4/5; patient presents for follow-up. She has missed her last 2 clinic appointments. I have not seen her in 3 weeks. She was recently hospitalized for altered mental status. She was involuntarily committed. She was evaluated by psychiatry and deemed to have competency. There was no specific cause of her altered mental status. It was concluded that her physical and mental health were declining due to her chronic medical conditions. Currently home health has been coming out for dressing changes. Patient has also been doing her own dressing changes. She reports more skin breakdown to the periwound and now has a new wound. She denies fever/chills. She reports  continued tenderness to the wound site. 4/12; patient with significant venous  insufficiency and a large wound on her left lower leg taking up about 80% of the circumference of her lower leg. Cultures of this grew MRSA and Pseudomonas. She had completed a course of ciprofloxacin now is starting doxycycline. She has been using Dakin's wet-to-dry and a Tubigrip. She has home health twice a week and we change it once. 4/19; patient presents for follow-up. She completed her course of doxycycline. She has been using Dakin's wet-to-dry dressing and Tubigrip. Home health changes the dressing twice weekly. Currently she has no issues or complaints. 4/26; patient presents for follow-up. At last clinic visit orders for home health were Iodosorb under compression therapy. Unfortunately they did not have the dressing and have been using Dakin's and gentamicin under the wrap. Patient currently denies signs of infection. She has no issues or complaints today. 5/3; patient presents for follow-up. Again Iodosorb has not been used under the compression therapy when home health comes out to change the wrap and dressing. They have been using Sorbact. It is unclear why this is happening since we send orders weekly to the agency. She denies signs of infection. Patient has not purchased the [place] antibiotics. We reached out to the company and they said they have been trying to contact her on a regular basis. We gave the patient the number to call to order the medication. 5/10; patient presents for follow-up. She has no issues or complaints today. Again home health has not been using Iodosorb. Mepilex was on the wound bed. No other dressings noted. She brought in her Keystone antibiotics. She denies signs of infection. 5/17; patient presents for follow-up. Home health has come out twice since she was last seen. Joint well she has been using Keystone antibiotic with Sorbact under the compression wrap. She has no issues or  complaints today. She denies signs of infection. 5/24; patient presents for follow-up. We have been using Keystone antibiotics with Sorbact under compression therapy. She is tolerating the treatment well. She is reporting improvement in wound healing. She denies signs of infection. 5/31; patient presents for follow-up. We continue to do [facility] antibiotics with Sorbact under compression therapy. She continues to report improvement in wound healing. Home health comes out and changes the dressing once weekly. 05-17-2022 upon evaluation today patient appears to be doing better in regard to her wound especially compared to the last time I saw her. Fortunately I do think that she is seeing improvements. With that being said I do believe that she may be benefit from sharp debridement today to clear away some of the necrotic debris I discussed that with her as well. She is an amendable to that plan. Otherwise she is very pleased with how the [employer] is doing for her. 6/14; patient presents for follow-up. We have been using Keystone antibiotic with Sorbact and absorbent dressings under 3 layer compression. She has no issues or complaints today. She reports improvement in wound healing. She denies signs of infection. 6/21; patient presents for follow-up. We are continuing with [facility] antibiotic and Sorbact under 3 layer compression. Patient has no complaints. Continued wound healing is happening. She denies signs of infection. 6/28; patient presents for follow-up. We have been using Keystone antibiotic with Sorbact under 3 layer compression. Usually home health comes out and changes the dressing twice a week. Unfortunately they did not go out to change the dressing. It is unclear why. Patient did not call them. She currently denies signs of infection. 7/5; patient presents for follow-up. We have been using  Keystone antibiotic with calcium alginate under 3 layer compression. She reports improvement in wound  healing. She denies signs of infection. Home health has come out to do dressing changes twice this past week. 7/12; patient presents for follow-up. We have been using Keystone antibiotic with calcium alginate under 3 layer compression. Patient states that home health came out once last week to change the dressing. She reports improvement in wound healing. She currently denies signs of infection. 7/19; patient presents for follow-up. We have been using Keystone antibiotic with calcium alginate under 3 layer compression. Home health came out once last week to change the dressing. She has no issues or complaints today. She denies signs of infection. 8/2; patient presents for follow-up. We have been using Keystone antibiotic with calcium alginate under 3 layer compression. Unfortunately she missed her appointment last week and home health did not come out to do dressing changes. Patient currently denies signs of infection. 8/9; patient presents for follow-up. We have been using Keystone with calcium alginate under 3 layer compression. She states that home health came out once last week. She currently denies signs of infection. Her wrap was completely wet. She states she was cleaning the top of the leg and water soaked down into the wrap. 8/16; patient presents for follow-up. We have been using Keystone with calcium alginate under 3 layer compression. She states that home health came out twice last week. She has no issues or complaints today. 8/23; patient presents for follow-up. He has been using Keystone with calcium alginate under 3 layer compression. Home health came out twice last week. She denies signs of infection. 8/30; patient presents for follow-up. We have been using Keystone with calcium alginate under 3 layer compression. Home health came out once last week to change the dressing. Patient reports improvement in wound healing. She states she is almost done with her chemotherapy infusions and  has 1 more left. 9/13; patient presents for follow-up. She has lost the capsules to her [facility] antibiotic which I believe is the vancomycin pills. She has her Zosyn powder today. We have been using Keystone antibiotic ointment with calcium alginate under 3 layer compression. She is concerned about systemic infection however her vitals are stable and there is no surrounding soft tissue infection. She would like to remain a patient in our wound care center however would like a second opinion for her wound care at another facility. She asked to be referred to [facility] wound care center. ADDELINE, CALARCO ([id number]) [id number] Page 11 of 15 9/20; patient presents for follow-up. She found her vancomycin capsules and brought in her complete Keystone antibiotic ointment set today. Unfortunately she has developed skin breakdown and Erythema to the right lower extremity With increased swelling. She states she went to a pow wow Over the weekend and was on her feet for extended periods of time. She saw her oncologist yesterday who prescribed her doxycycline for her right lower extremity erythema. 9/27; patient presents for follow-up. We have been using Keystone antibiotic with Aquacel under 3 layer compression to the lower extremities bilaterally. When home health came and changed the wrap she secretly put coffee into the spray mix along with [facility] antibiotic on her leg thinking the acidic component would better activate the zoysn (sonething she discussed with her microbiologist brother). She has reported improvement in wound healing. 10/4; patient presents for follow-up. She has no issues or complaints today. We have been doing Aquacel and keystone under 3 layer compression to the lower extremities  bilaterally. This morning she took the right lower extremity wrap off as it was uncomfortable. She has no open wounds to this leg. 10/11; patient presents for follow-up. We have been  doing Aquacel with Keystone antibiotic ointment under 3 layer compression to the left lower extremity. She developed a small blister to the anterior aspect of the left leg noticed when the wrap was taken off on intake. She currently denies signs of infection. 10/18; patient presents for follow-up. We have been doing Aquacel with Keystone antibiotic ointment under 3 layer compression to the left lower extremity. There has been continued improvement in wound healing. She denies signs of infection. 10/25; patient arrives for treatment of venous insufficiency ulcers on her left lower leg both lateral and medial are remanence of apparently a circumferential wound. Much improved. We are using topicals Keystone and Aquacel Ag under 3 layer compression we continue to make good progress. The patient talk to me at some length with regards to different things she has on her forehead and her Peri orbital area for which she is apparently applying [place]. She feels that what ever we are treating on her wounds is a more systemic problem. I really was not able to get a handle on what she is talking about however I did caution her not to put the [place] in her eyes. 11/1; her wounds continue to improve she is using Keystone and Aquacel Ag G under 3 layer compression. Our intake nurse notes erythema and edema in the right leg. The patient has a litany of concerns with regards to a rash on her forehead or ears and other systemic complaints. She has an appointment with dermatology on November 11 11/8; patient presents for follow-up. We have been using Keystone and Aquacel under 4-layer compression. She has no issues or complaints today. She reports improvement in wound healing. 11/15; patient presents for follow-up. We have been using Aquacel with Keystone antibiotic under 3 layer compression. Patient continues improvement in wound healing. 12/6; patient presents for follow-up. We have been using Aquacel with  Keystone antibiotic ointment under 3 layer compression. Wounds appear well-healing. 12/13; patient presents for follow-up. We have been using Aquacel with Keystone antibiotic under 3 layer compression. She has no issues or complaints today. 12/27 left lateral medial ankle. Superficial wounds remain there is significantly improved we are using Keystone backed with Zetuvit under 4-layer compression 1/3; patient presents for follow-up. Her wounds appear well-healing. We have been using Aquacel Ag with Keystone antibiotic ointment under compression therapy. This should be a 3 layer compression. 1/17; patient presents for follow-up. Her wounds on her left lower extremity are well-healing. We are using Aquacel Ag and Keystone antibiotic ointment under compression therapy. She missed her last clinic appointment. She states that the wrap has been changed twice weekly since she was last seen. Unfortunately she has developed increased warmth and redness to the right lower extremity consistent with cellulitis. She states this started a few days ago. 1/24; patient presents for follow-up. Her wounds on the left lower extremity are well-healing with Aquacel and Keystone antibiotic ointment under compression therapy. I prescribed Keflex at last clinic visit for cellulitis of the right lower extremity. Unfortunately this has not resolved. She did not follow-up with her PCP for contact our office about her symptoms. Objective Constitutional Vitals Time Taken: 10:08 AM, Height: 66 in, Weight: 153 lbs, BMI: 24.7, Temperature: 98.4 F, Pulse: 87 bpm, Respiratory Rate: 16 breaths/min, Blood Pressure: 132/72 mmHg. General Notes: T the left lower extremity  there are areas with nonviable tissue. Postdebridement there are a few small scattered wounds to the lateral and o medial aspect with granulation tissue. No signs of infection to the sites. T the right lower extremity there is increased warmth and erythema throughout  the leg o that stops below the knee. No purulent drainage. Integumentary (Hair, Skin) Wound #1 status is Open. Original cause of wound was Gradually Appeared. The date acquired was: 04/06/2021. The wound has been in treatment 78 weeks. The wound is located on the Left,Medial Lower Leg. The wound measures 0.1cm length x 0.1cm width x 0.1cm depth; 0.008cm^2 area and 0.001cm^3 volume. There is Fat Layer (Subcutaneous Tissue) exposed. There is a medium amount of serosanguineous drainage noted. The wound margin is flat and intact. There is medium (34-66%) red, pink granulation within the wound bed. There is a medium (34-66%) amount of necrotic tissue within the wound bed including Adherent Slough. Wound #8 status is Open. Original cause of wound was Pressure Injury. The date acquired was: 10/18/2022. The wound has been in treatment 11 weeks. The wound is located on the Left,Lateral Lower Leg. The wound measures 0.1cm length x 0.1cm width x 0.1cm depth; 0.008cm^2 area and 0.001cm^3 volume. There is Fat Layer (Subcutaneous Tissue) exposed. There is a medium amount of serosanguineous drainage noted. There is medium (34-66%) pink granulation within the wound bed. There is a medium (34-66%) amount of necrotic tissue within the wound bed including Adherent Slough. Assessment [address] 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 I recommended continue the course with [facility]  antibiotic ointment and Aquacel Ag under 3 layer compression. Unfortunately she still has increased erythema and warmth to her right lower extremity. I recommended she go to the ED for potential IV antibiotics. She declined this recommendation. At this time I will prescribe her clindamycin but urged that if in 48 hours if she does not improve that she go to the ED. She knows to go to the ED right away if she has worsening symptoms and also including fever/chills, nausea/vomiting. 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 There was a Excisional Skin/Subcutaneous Tissue Debridement with a total area of 1 sq cm performed by Kalman Shan, MD. With the following instrument(s): Curette to remove Viable and Non-Viable tissue/material. Material removed includes Subcutaneous Tissue and Slough and. A time out was conducted at 10:34, prior to the start of the procedure. A Minimum amount of bleeding was controlled with Pressure. The procedure was tolerated well. Post Debridement Measurements: 5.4cm length x 0.7cm width x 0.1cm depth; 0.297cm^3 volume. Character of Wound/Ulcer Post Debridement is stable. Severity of Tissue Post Debridement is: Fat layer exposed. 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 There was a Four Layer Compression Therapy Procedure with a pre-treatment ABI of 1.5 by [name]. Post procedure Diagnosis Wound #1: Same as Pre-Procedure Wound #8 Pre-procedure diagnosis of Wound #8 is a  Venous Leg Ulcer located on the Left,Lateral Lower Leg .Severity of Tissue Pre Debridement is: Fat layer exposed. There was a Excisional Skin/Subcutaneous Tissue Debridement with a total area of 0.25 sq cm performed by Kalman Shan, MD. With the  following instrument(s): Curette to remove Viable and Non-Viable tissue/material. Material removed includes Subcutaneous Tissue and Slough and. A time out was conducted at 10:34, prior to the start of the procedure. A Minimum amount of bleeding was controlled with Pressure. The procedure was tolerated well. Post Debridement Measurements: 1.5cm length x 2cm width x 0.1cm depth; 0.236cm^3 volume. Character of Wound/Ulcer Post Debridement is stable. Severity of Tissue Post Debridement is: Fat layer exposed. Post procedure Diagnosis Wound #8: Same as Pre-Procedure Plan Follow-up Appointments: Return Appointment in 1 week. Nurse Visit as needed Home Health: [place]: Rocky Morel [phone] [place] for wound care. May utilize formulary equivalent dressing for wound treatment orders unless otherwise specified. Home Health Nurse may visit PRN to address patientoos wound care needs. - home health to see patient 2 times per week, patient to be seen at wound clinic once per week **Please direct any NON-WOUND related issues/requests for orders to patient's Primary Care Physician. **If current dressing causes regression in wound condition, may D/C ordered dressing product/s and apply Normal Saline Moist Dressing daily until next [place] or Other MD appointment. **Notify [facility] of regression in wound condition at [phone]. Other Home Health Orders/Instructions: Bathing/ Shower/ Hygiene: May shower with wound dressing protected with water repellent cover or cast protector. No tub bath. Anesthetic (Use 'Patient Medications' Section for Anesthetic Order Entry): Lidocaine applied to wound bed Edema Control - Lymphedema / Segmental Compressive Device / Other: Optional: One layer of unna paste to top of compression wrap (to act as an anchor). Elevate, Exercise Daily and Avoid Standing for Long Periods of Time. Elevate legs to the level of the heart and  pump ankles as often as possible Elevate leg(s) parallel to the floor when sitting. DO YOUR BEST to sleep in the bed at night. DO NOT sleep in your recliner. Long hours of sitting in a recliner leads to swelling of the legs and/or potential wounds on your backside. Additional Orders / Instructions: Follow Nutritious Diet and Increase Protein Intake Other: - recommend going to ED for cellulitis in right lower leg if no better in 2 days Medications-Please add to medication list.: Keystone Compound P.O. Antibiotics - start Doxycycline right leg cellulitis The following medication(s) was prescribed: clindamycin HCl oral 300 mg capsule 1 1 capsule oral four times daily x 7 days starting 01/03/2023 WOUND #1: - Lower Leg Wound Laterality: Left, Medial Cleanser: Wound Cleanser 3 x Per Week/30 Days Discharge Instructions: Wash your hands with soap and water. Remove old dressing, discard into plastic bag and place into trash. Cleanse the wound with Wound Cleanser prior to applying a clean dressing using gauze sponges, not tissues or cotton balls. Do not scrub or use excessive force. Pat dry using gauze sponges, not tissue or cotton balls. SHATOYA, ROETS ([id number]) [id number] Page 13 of 15 Topical: Triamcinolone Acetonide Cream, 0.1%, 15 (g) tube 3 x Per Week/30 Days Discharge Instructions: lower leg Prim Dressing: AquacelAg Advantage Dressing, 2X2 (in/in) 3 x Per Week/30 Days ary Discharge Instructions: Apply to wound as directed Secondary Dressing: Zetuvit Plus 4x8 (in/in) 3 x Per Week/30 Days Com pression Wrap: Medichoice 4 layer Compression System, 35-40 mmHG 3 x Per Week/30 Days Discharge Instructions: Apply multi-layer wrap as directed. WOUND #  8: - Lower Leg Wound Laterality: Left, Lateral Cleanser: Wound Cleanser 3 x Per Week/30 Days Discharge Instructions: Wash your hands with soap and water. Remove old dressing, discard into plastic bag and place into trash.  Cleanse the wound with Wound Cleanser prior to applying a clean dressing using gauze sponges, not tissues or cotton balls. Do not scrub or use excessive force. Pat dry using gauze sponges, not tissue or cotton balls. Topical: Triamcinolone Acetonide Cream, 0.1%, 15 (g) tube 3 x Per Week/30 Days Discharge Instructions: Mix 1:1 with Nystatin cream and apply to periwound and leg Prim Dressing: AquacelAg Advantage Dressing, 2X2 (in/in) 3 x Per Week/30 Days ary Discharge Instructions: Apply to wound as directed Secondary Dressing: Zetuvit Plus 4x8 (in/in) 3 x Per Week/30 Days Com pression Wrap: Medichoice 4 layer Compression System, 35-40 mmHG 3 x Per Week/30 Days Discharge Instructions: Apply multi-layer wrap as directed. 1. Left lower extremityooAquacel Ag with Keystone antibiotic ointment under 3 layer compression 2. Right lower extremityoostart clindamycin, go to the ED symptoms do not improve. Electronic Signature(s) Signed: 01/03/2023 3:12:59 PM By: Kalman Shan DO Entered By: Kalman Shan on 01/03/2023 12:25:16 -------------------------------------------------------------------------------- ROS/PFSH Details Patient Name: Date of Service: [name], Laura Santiago Laura M. 01/03/2023 10:00 Laura Santiago Record Number: [id number] Patient Account Number: [id number] Date of Birth/Sex: Treating RN: 1946/04/14 (77 y.o. [name] Primary Care Provider: [place] Other Clinician: [name] Referring Provider: Treating Provider/Extender: [name] Weeks in Treatment: 1 Information Obtained From Patient Eyes Medical History: Negative for: Cataracts; Glaucoma; Optic Neuritis Ear/Nose/Mouth/Throat Medical History: Negative for: Chronic sinus problems/congestion; Middle ear problems Hematologic/Lymphatic Medical History: Negative for: Anemia; Hemophilia; Human Immunodeficiency Virus; Lymphedema; Sickle Cell Disease Respiratory Medical  History: Negative for: Aspiration; Asthma; Chronic Obstructive Pulmonary Disease (COPD); Pneumothorax; Sleep Apnea; Tuberculosis Cardiovascular Medical History: Positive for: Hypertension LEOMIA, Laura Santiago ([id number]) [id number] Page 14 of 15 Negative for: Angina; Arrhythmia; Congestive Heart Failure; Coronary Artery Disease; Deep Vein Thrombosis; Hypotension; Myocardial Infarction; Peripheral Arterial Disease; Peripheral Venous Disease; Phlebitis; Vasculitis Gastrointestinal Medical History: Negative for: Cirrhosis ; Colitis; Crohns; Hepatitis A; Hepatitis B; Hepatitis C Endocrine Medical History: Negative for: Type I Diabetes; Type II Diabetes Genitourinary Medical History: Negative for: End Stage Renal Disease Immunological Medical History: Negative for: Lupus Erythematosus; Raynauds; Scleroderma Integumentary (Skin) Medical History: Negative for: History of Burn; History of pressure wounds Musculoskeletal Medical History: Positive for: Osteoarthritis Negative for: Gout; Rheumatoid Arthritis; Osteomyelitis Oncologic Medical History: Positive for: Received Chemotherapy; Received Radiation Past Medical History Notes: breast cancer Immunizations Pneumococcal Vaccine: Received Pneumococcal Vaccination: No Implantable Devices None Family and Social History 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 01/03/2023 Medical Record Number: [id number] Patient Account Number: [id number] Date of Birth/Sex: Treating RN: 1946/09/01 (77 y.o. [name], Laura Santiago [place] ([id number]) [id number] Page 15 of 15 Primary  Care Provider: [place] Other Clinician: [name] Referring Provider: Treating Provider/Extender: [name] Weeks in Treatment: 78 Diagnosis Coding ICD-10 Codes Code Description [phone] 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 I87.311 Chronic venous hypertension (idiopathic) with ulcer of right lower extremity I87.2 Venous insufficiency (chronic) (peripheral) Z79.01 Long term (current)  use of anticoagulants I10 Essential (primary) hypertension 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 Cellulitis of right lower limb Facility Procedures : CPT4 Code: [id number] Description: 90383 - DEB SUBQ TISSUE 20 SQ CM/< ICD-10 Diagnosis Description 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 Modifier: sed Quantity: 1 Physician Procedures : CPT4 Code Description Modifier [id number] - WC PHYS LEVEL 4 - EST PT ICD-10 Diagnosis Description L03.115 Cellulitis of right lower limb I87.2 Venous insufficiency (chronic) (peripheral) 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 I10 Essential  (primary) hypertension Quantity: 1 : [id number] 11042 - WC PHYS SUBQ TISS 20 SQ CM ICD-10 Diagnosis Description 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 Quantity: 1 Electronic Signature(s) Signed: 01/03/2023 3:12:59 PM By: Kalman Shan DO Entered By: Kalman Shan on 01/03/2023 12:25:45

## 2023-01-04 NOTE — Progress Notes (Signed)
KRISTAIN, FILO ([id number]) [id number] Page 1 of 8 Visit Report for 01/03/2023 Arrival Information Details Patient Name: Date of Service: Laura Ace. 01/03/2023 10:00 [place] Record Number: [id number] Patient Account Number: [id number] Date of Birth/Sex: Treating RN: Jul 06, 1946 (77 y.o. Laura Santiago Primary Care Advit Trethewey: [place] Other Clinician: [name] Referring Glorian Mcdonell: Treating Aristidis Talerico/Extender: [name] Weeks in Treatment: 73 Visit Information History Since Last Visit All ordered tests and consults were completed: No Patient Arrived: [name] Added or deleted any medications: No Arrival Time: 09:59 Any new allergies or adverse reactions: No Transfer Assistance: None Had a fall or experienced change in No Patient Identification Verified: Yes activities of daily living that may affect Secondary Verification Process Completed: Yes risk of falls: Patient Requires Transmission-Based No Signs or symptoms of abuse/neglect since last visito No Precautions: Hospitalized since last visit: No Patient Has Alerts: Yes Implantable device outside of the clinic excluding No Patient Alerts: PT HAS SERVICE cellular tissue based products placed in the center ANIMAL since last visit: ABI 07/11/21 Has Dressing in Place as Prescribed: Yes R) 1.16 L) 1.27 Has Compression in Place as Prescribed: Yes Pain Present Now: No Electronic Signature(s) Signed: 01/03/2023 5:30:54 PM By: [name] Entered By: [name] on 01/03/2023 10:08:20 -------------------------------------------------------------------------------- Clinic Level of Care Assessment Details Patient Name: Date of Service: Laura Ace. 01/03/2023 10:00 [place] Record Number: [id number] Patient Account Number: [id number] Date of Birth/Sex: Treating RN: 11/04/1946 (77 y.o. Laura Santiago Primary Care Gizel Riedlinger: [place] Other Clinician: [name] Referring Kristoffer Bala: Treating Ector Laurel/Extender: [name] Weeks in Treatment: 40 Clinic Level of Care Assessment Items TOOL 1 Quantity Score '[]'$  - 0 Use when EandM and Procedure is performed on INITIAL visit ASSESSMENTS - Nursing Assessment / Reassessment '[]'$  - 0 General Physical Exam (combine w/ comprehensive assessment (listed just below) when performed on new pt. [address]JEWELIA, Laura Santiago ([id number]) [id number] Page 2 of 8 '[]'$  - 0 Comprehensive Assessment (HX, ROS, Risk Assessments, Wounds Hx, etc.) ASSESSMENTS - Wound and Skin Assessment / Reassessment '[]'$  - 0 Dermatologic / Skin Assessment (not related to wound area) ASSESSMENTS - Ostomy and/or Continence Assessment and Care '[]'$  - 0 Incontinence Assessment and Management '[]'$  - 0 Ostomy Care Assessment and Management (repouching, etc.) PROCESS - Coordination of Care '[]'$  - 0 Simple Patient / Family Education for ongoing care '[]'$  - 0 Complex (extensive) Patient / Family Education for ongoing care '[]'$  - 0 Staff obtains Programmer, systems, Records, T Results / Process Orders est '[]'$  - 0 Staff telephones HHA, Nursing Homes / Clarify orders / etc '[]'$  - 0 Routine Transfer to another Facility (non-emergent condition) '[]'$  - 0 Routine Hospital Admission (non-emergent condition) '[]'$  - 0 New Admissions / Biomedical engineer / Ordering NPWT Apligraf, etc. , '[]'$  - 0 Emergency Hospital Admission (emergent condition) PROCESS - Special Needs '[]'$  - 0 Pediatric / Minor Patient Management '[]'$  - 0 Isolation Patient Management '[]'$  - 0 Hearing / Language / Visual special needs '[]'$  - 0 Assessment of Community assistance (transportation, D/C planning, etc.) '[]'$  - 0 Additional assistance / Altered mentation '[]'$  - 0 Support Surface(s) Assessment (bed, cushion, seat, etc.) INTERVENTIONS - Miscellaneous '[]'$  - 0 External ear exam '[]'$  - 0 Patient Transfer  (multiple staff / Civil Service fast streamer / Similar devices) '[]'$  - 0 Simple Staple / Suture removal (25 or less) '[]'$  - 0 Complex Staple / Suture removal (26 or  more) '[]'$  - 0 Hypo/Hyperglycemic Management (do not check if billed separately) '[]'$  - 0 Ankle / Brachial Index (ABI) - do not check if billed separately Has the patient been seen at the hospital within the last three years: Yes Total Score: 0 Level Of Care: ____ Electronic Signature(s) Signed: 01/03/2023 5:30:54 PM By: [name] Entered By: [name] on 01/03/2023 10:42:07 -------------------------------------------------------------------------------- Compression Therapy Details Patient Name: Date of Service: Laura Jewett [name] 01/03/2023 10:00 A M Medical Record Number: [id number] Patient Account Number: [id number] Date of Birth/Sex: Treating RN: [birth year] (77 y.o. Laura Santiago Primary Care Rannie Craney: [place] Other Clinician: Cecely, Laura Santiago ([id number]) [id number] Page 3 of 8 Referring Anber Mckiver: Treating Demitrius Crass/Extender: [name] Weeks in Treatment: 78 Compression Therapy Performed for Wound Assessment: Wound #1 Left,Medial Lower Leg Performed By: [name], [name], Compression Type: Four Layer Pre Treatment ABI: 1.5 Post Procedure Diagnosis Same as Pre-procedure Electronic Signature(s) Signed: 01/03/2023 5:30:54 PM By: [name] Entered By: [name] on 01/03/2023 10:39:50 -------------------------------------------------------------------------------- Lower Extremity Assessment Details Patient Name: Date of Service: Laura Ace. 01/03/2023 10:00 A M Medical Record Number: [id number] Patient Account Number: [id number] Date of Birth/Sex: Treating RN: 11-Jun-1946 (77 y.o. Laura Santiago Primary Care Cutter Passey: [place] Other Clinician: [name] Referring Maalle Starrett: Treating Chriss Mannan/Extender:  [name] Weeks in Treatment: 78 Edema Assessment Assessed: [Left: Yes] [Right: Yes] Edema: [Left: Yes] [Right: Yes] Calf Left: Right: Point of Measurement: 35 cm From Medial Instep 38.4 cm 43 cm Ankle Left: Right: Point of Measurement: 12 cm From Medial Instep 21.3 cm 23.8 cm Vascular Assessment Pulses: Dorsalis Pedis Palpable: [Left:Yes] [Right:Yes] Electronic Signature(s) Signed: 01/03/2023 5:30:54 PM By: [name] Signed: 01/03/2023 5:47:34 PM By: Gretta Cool, BSN, RN, CWS, [name] RN, BSN Entered By: [name] on 01/03/2023 10:25:18 Laura Santiago ([id number]) [id number] Page 4 of 8 -------------------------------------------------------------------------------- Multi Wound Chart Details Patient Name: Date of Service: Laura Ace. 01/03/2023 10:00 A M Medical Record Number: [id number] Patient Account Number: [id number] Date of Birth/Sex: Treating RN: 1946-09-21 (77 y.o. Laura Santiago Primary Care Orean Giarratano: [place] Other Clinician: [name] Referring Johnta Couts: Treating Braden Cimo/Extender: [name] Weeks in Treatment: 61 Vital Signs Height(in): 66 Pulse(bpm): 87 Weight(lbs): 153 Blood Pressure(mmHg): 132/72 Body Mass Index(BMI): 24.7 Temperature(F): 98.4 Respiratory Rate(breaths/min): 16 [1:Photos:] [N/A:N/A] Left, Medial Lower Leg Left, Lateral Lower Leg N/A Wound Location: Gradually Appeared Pressure Injury N/A Wounding Event: Venous Leg Ulcer Venous Leg Ulcer N/A Primary Etiology: Hypertension, Osteoarthritis, ReceivedHypertension, Osteoarthritis, ReceivedN/A Comorbid History: Chemotherapy, Received Radiation Chemotherapy, Received Radiation 04/06/2021 10/18/2022 N/A Date Acquired: 64 11 N/A Weeks of Treatment: Open Open N/A Wound Status: No No N/A Wound Recurrence: Yes No N/A Clustered Wound: 0.1x0.1x0.1 0.1x0.1x0.1 N/A Measurements L x W  x D (cm) 0.008 0.008 N/A A (cm) : rea 0.001 0.001 N/A Volume (cm) : 100.00% 99.80% N/A % Reduction in Area: 100.00% 99.90% N/A % Reduction in Volume: Full Thickness Without Exposed Full Thickness Without Exposed N/A Classification: Support Structures Support Structures Medium Medium N/A Exudate Amount: Serosanguineous Serosanguineous N/A Exudate Type: red, brown red, brown N/A Exudate Color: Flat and Intact N/A N/A Wound Margin: Medium (34-66%) Medium (34-66%) N/A Granulation Amount: Red, Pink Pink N/A Granulation Quality: Medium (34-66%) Medium (34-66%) N/A Necrotic Amount: Fat Layer (Subcutaneous Tissue): Yes Fat Layer (Subcutaneous Tissue): Yes N/A Exposed Structures: Fascia: No Fascia: No Tendon: No Tendon: No  Muscle: No Muscle: No Joint: No Joint: No Bone: No Bone: No Small (1-33%) None N/A Epithelialization: Treatment Notes Electronic Signature(s) Signed: 01/03/2023 5:30:54 PM By: [name] Entered By: [name] on 01/03/2023 10:25:23 Laura Santiago ([id number]) [id number] Page 5 of 8 -------------------------------------------------------------------------------- Pain Assessment Details Patient Name: Date of Service: Laura Ace. 01/03/2023 10:00 [place] Record Number: [id number] Patient Account Number: [id number] Date of Birth/Sex: Treating RN: 08/05/1946 (77 y.o. Laura Santiago Primary Care Aluel Schwarz: [place] Other Clinician: [name] Referring Darrell Hauk: Treating Selden Noteboom/Extender: [name] Weeks in Treatment: 39 Active Problems Location of Pain Severity and Description of Pain Patient Has Paino No Site Locations Pain Management and Medication Current Pain Management: Electronic Signature(s) Signed: 01/03/2023 5:30:54 PM By: [name] Signed: 01/03/2023 5:47:34 PM By: Gretta Cool, BSN, RN, CWS, [name] RN, BSN Entered By: [name] on 01/03/2023  10:11:09 -------------------------------------------------------------------------------- Wound Assessment Details Patient Name: Date of Service: Laura Jewett [name] 01/03/2023 10:00 A M Medical Record Number: [id number] Patient Account Number: [id number] Date of Birth/Sex: Treating RN: January 08, 1946 (77 y.o. Laura Santiago Primary Care Ermel Verne: [place] Other Clinician: [name] Referring Donye Campanelli: Treating Laura Santiago/Extender: [facility] [place], [place] ([id number]) [phone].pdf Page 6 of 8 Weeks in Treatment: 78 Wound Status Wound Number: 1 Primary Venous Leg Ulcer Etiology: Wound Location: Left, Medial Lower Leg Wound Status: Open Wounding Event: Gradually Appeared Comorbid Hypertension, Osteoarthritis, Received Chemotherapy, Date Acquired: 04/06/2021 History: Received Radiation Weeks Of Treatment: 78 Clustered Wound: Yes Photos Wound Measurements Length: (cm) 0.1 Width: (cm) 0.1 Depth: (cm) 0.1 Area: (cm) 0.008 Volume: (cm) 0.001 % Reduction in Area: 100% % Reduction in Volume: 100% Epithelialization: Small (1-33%) Wound Description Classification: Full Thickness Without Exposed Suppor Wound Margin: Flat and Intact Exudate Amount: Medium Exudate Type: Serosanguineous Exudate Color: red, brown t Structures Foul Odor After Cleansing: No Slough/Fibrino Yes Wound Bed Granulation Amount: Medium (34-66%) Exposed Structure Granulation Quality: Red, Pink Fascia Exposed: No Necrotic Amount: Medium (34-66%) Fat Layer (Subcutaneous Tissue) Exposed: Yes Necrotic Quality: Adherent Slough Tendon Exposed: No Muscle Exposed: No Joint Exposed: No Bone Exposed: No Electronic Signature(s) Signed: 01/03/2023 5:30:54 PM By: [name] Signed: 01/03/2023 5:47:34 PM By: Gretta Cool, BSN, RN, CWS, [name] RN, BSN Entered By: [name] on 01/03/2023  10:24:09 -------------------------------------------------------------------------------- Wound Assessment Details Patient Name: Date of Service: Laura Jewett [name] 01/03/2023 10:00 A M Medical Record Number: [id number] Patient Account Number: [id number] Date of Birth/Sex: Treating RN: 01/18/1946 (77 y.o. Laura Santiago Primary Care Prather Failla: [place] Other Clinician: [name] Referring Jakelyn Squyres: Treating Lakiya Cottam/Extender: Laura Santiago [facility] Weeks in Treatment: [address], [place] M ([id number]) [phone].pdf Page 7 of 8 Wound Status Wound Number: 8 Primary Venous Leg Ulcer Etiology: Wound Location: Left, Lateral Lower Leg Wound Status: Open Wounding Event: Pressure Injury Comorbid Hypertension, Osteoarthritis, Received Chemotherapy, Date Acquired: 10/18/2022 History: Received Radiation Weeks Of Treatment: 11 Clustered Wound: No Photos Wound Measurements Length: (cm) 0.1 Width: (cm) 0.1 Depth: (cm) 0.1 Area: (cm) 0.008 Volume: (cm) 0.001 % Reduction in Area: 99.8% % Reduction in Volume: 99.9% Epithelialization: None Wound Description Classification: Full Thickness Without Exposed Suppor Exudate Amount: Medium Exudate Type: Serosanguineous Exudate Color: red, brown t Structures Foul Odor After Cleansing: No Slough/Fibrino Yes Wound Bed Granulation Amount: Medium (34-66%) Exposed Structure Granulation Quality: Pink Fascia Exposed: No Necrotic Amount: Medium (34-66%) Fat Layer (Subcutaneous Tissue) Exposed: Yes Necrotic Quality: Adherent Slough  Tendon Exposed: No Muscle Exposed: No Joint Exposed: No Bone Exposed: No Electronic Signature(s) Signed: 01/03/2023 5:30:54 PM By: [name] Signed: 01/03/2023 5:47:34 PM By: Gretta Cool, BSN, RN, CWS, [name] RN, BSN Entered By: [name] on 01/03/2023 10:24:31 -------------------------------------------------------------------------------- Vitals  Details Patient Name: Date of Service: Laura Jewett [name] 01/03/2023 10:00 [place] Record Number: [id number] Patient Account Number: [id number] Date of Birth/Sex: Treating RN: 03-19-1946 (77 y.o. Laura Santiago Primary Care Aaliah Jorgenson: [place] Other Clinician: [name] Referring [name] [name]: Treating Saafir Abdullah/Extender: Laura Santiago [facility] Weeks in Treatment: [address], [place] M ([id number]) [phone].pdf Page 8 of 8 Vital Signs Time Taken: 10:08 Temperature (F): 98.4 Height (in): 66 Pulse (bpm): 87 Weight (lbs): 153 Respiratory Rate (breaths/min): 16 Body Mass Index (BMI): 24.7 Blood Pressure (mmHg): 132/72 Reference Range: 80 - 120 mg / dl Electronic Signature(s) Signed: 01/03/2023 5:30:54 PM By: [name] Entered By: [name] on 01/03/2023 10:11:05

## 2023-01-06 ENCOUNTER — Other Ambulatory Visit: Payer: Self-pay | Admitting: Hospice and Palliative Medicine

## 2023-01-08 ENCOUNTER — Non-Acute Institutional Stay: Payer: Medicare Other | Admitting: Nurse Practitioner

## 2023-01-08 ENCOUNTER — Encounter: Payer: Self-pay | Admitting: Nurse Practitioner

## 2023-01-08 DIAGNOSIS — C50919 Malignant neoplasm of unspecified site of unspecified female breast: Secondary | ICD-10-CM

## 2023-01-08 DIAGNOSIS — Z515 Encounter for palliative care: Secondary | ICD-10-CM

## 2023-01-08 DIAGNOSIS — R5381 Other malaise: Secondary | ICD-10-CM

## 2023-01-08 NOTE — Progress Notes (Signed)
Laura Santiago Consult Note Telephone: [phone]  Fax: [phone]    Date of encounter: 01/08/23 8:53 PM PATIENT NAME: Laura Santiago [place] [address]   [phone] (home)  DOB: 1946/04/20 MRN: [id number] PRIMARY CARE PROVIDER:    [place],  [address] [place] Santiago [address] [phone]  RESPONSIBLE PARTY:    Contact Information     Name Relation Home Work Mobile   Laura Santiago Daughter   [phone]      I met face to face with patient in Laura Santiago living home. Palliative Care was asked to follow this patient by consultation request of  Laura Santiago to address advance care planning and complex medical decision making. This is a follow up visit.                                  ASSESSMENT AND PLAN / RECOMMENDATIONS: Symptom Management/Plan: 1. Advance Care Planning;  DNR 2. Goals of Care: Goals include to maximize quality of life and symptom management. Our advance care planning conversation included a discussion about:    The value and importance of advance care planning  Exploration of personal, cultural or spiritual beliefs that might influence medical decisions  Exploration of goals of care in the event of a sudden injury or illness  Identification and preparation of a healthcare agent  Review and updating or creation of an advance directive document. 3. Palliative care encounter; Palliative care encounter; Palliative medicine team will continue to support patient, patient's family, and medical team. Visit consisted of counseling and education dealing with the complex and emotionally intense issues of symptom management and palliative care in the setting of serious and potentially life-threatening illness  4. Debility secondary to breast cancer with metastases to lymph node and bone. Continue to encourage mobility, ambulating with walker, no recent falls. Discussed home  environment. Laura Santiago does have a support dog who lives with her at [facility]. She is followed at wound clinic. Laura Santiago endorses more difficulty with getting to appointments, transportation, multiple specialists, discussed in home primary provider.   12/05/2022 weight 143.3 lbs  5. Chronic pain secondary to metastases to bone/chronic right knee pain secondary to arthritis; reviewed pain scale, discussed pain management with Laura Santiago Oncology, effective pain relief. Chronic pain from her bone metastases for which she is currently on oxycodone 10 mg every 4 hours as needed and 12 mcg fentanyl patch.    6. Memory impairment, discussed at length about radiation, chemo, events over the last several years moving from her home to a 1 bedroom independent living facility. We talked about challenges with her short term memory, daily focus, routine. Supportive role  Follow up Palliative Care Visit: Palliative care will continue to follow for complex medical decision making, advance care planning, and clarification of goals. Return 4 to 8 weeks or prn.  I spent 61 minutes providing this consultation. More than 50% of the time in this consultation was spent in counseling and care coordination. PPS: 50% Chief Complaint: Follow up palliative consult for complex medical decision making, address goals, manage ongoing symptoms  HISTORY OF PRESENT ILLNESS:  Laura Santiago is a 77 y.o. 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 Hx of DVT on Xarelto, venus status, chronic right/left lower extremity ulceration, HTN, CKD, ADHD, peripheral  neuropathy secondary to chemotherapy.  Purpose of today PC f/u visit further discussion monitor trends of appetite, weights, monitor for functional, cognitive decline with chronic disease progression, assess any active symptoms, supportive role. Laura Santiago resides at [facility], independent living facility. Laura Santiago  was ambulatory with walker, denies recent falls. Laura Santiago does perform her adl's, receives meals. Last seen by Laura Santiago 12/05/2022 Oncology screening mammogram in September 2014 revealed 2.1 x 2.3 x 1.8 cm irregular mass in her right breast/ER 10% positive PR negative with HER2 positive +3.  Received neoadjuvant chemotherapy with Taxol Herceptin/Perjeta for 4 cycles followed by dose dense AC/Herceptin x4; completed in March 2015.  Right lumpectomy on 04/03/2014 revealed scant residual invasive ductal carcinoma [id number].  Completed 1 year of adjuvant Herceptin chemotherapy with adjuvant radiation treatment.  Anastrozole which she took on and off starting November 2015 and stopped sometime in 2020. Hospitalized with neck pain with workup revealing lytic lesions involving C5-C6 with pathological vertebral fractures, underwent radiation treatment to this area.  Image guided biopsy of the L1 vertebral body showed metastatic carcinoma consistent with breast origin ER 10% PR 0% and HER2 amplified ratio 5.1 average HER2 signal number per cell 15.0 average CEP 17 signals number per cell 3.0.  Baseline echocardiogram on 05/02/2021 showed a normal EF of 62% recommended Taxol Herceptin and Perjeta which she received for 2 cycles at [facility] until June 10, 2021. On Zometa in [name] with ongoing dental issues.  Received Xgeva in the past. Last Delton See was in July 2021.  Last PET scan was on 03/21/2021 revealing diffuse osseous metastatic disease involving the head neck chest abdomen and pelvis and spine; Left lung apex hypermetabolic nodule and multiple hypermetabolic liver lesions concerning for disease involvement. Enlarged hypermetabolic left inguinal lymph nodes along with hypermetabolic external iliac and left supraclavicular lymph nodes; Received Taxol Herceptin and Perjeta for about a year, repeat echocardiogram on 11/30/2022 shows an EF of 35 to 40% again with the global longitudinal strain of -13.6%/not giving Herceptin and Perjeta  at this time.   I visited and observed Laura Santiago in her home, she has a support dog. Laura Santiago does ambulate with walker, no recent fall. We talked about ros, functional abilities, past medical history, social and family history. We talked about wounds to BLE, challenges with treatments going to wound clinic. We talked about home health, Cardiology and Oncology appointments. We talked about what she is able to do adl's, bathing, dressing. She does feed herself with varied appetite. We talked about residing at [place] with her dtg [name] close, visiting about every few weeks as [name] works with a new baby. We talked about support system. 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 We talked about her daily routine, challenges with small space and much clutter. We talked about her support dog, stress, daily concerns, coping strategies. We talked about option of in home primary provider, in agreement. I contact [name], updated on pc visit, in agreement to proceed with in home primary provide. We talked about cognitive impairment, more difficulty with memory, organization. Laura Santiago was very engaging, interactive. Discussed medical goals, poc, medications. Therapeutic listening, emotional support provided. Questions answered. PC f/u visit further discussion monitor trends of appetite, weights, monitor for functional, cognitive decline with chronic disease progression, assess any active symptoms, supportive role in 4 to 8 weeks per Laura Santiago request.   PC f/u visit further discussion monitor trends of appetite, weights, monitor for functional, cognitive decline with  chronic disease progression, assess any active symptoms, supportive role.  History obtained from review of EMR, discussion with Laura Santiago.  I reviewed available labs, medications, imaging, studies and related documents from the EMR.  Records reviewed and summarized above.  Physical Exam: Constitutional:  NAD General: frail appearing, pleasant female ENMT: oral mucous membranes moist CV: S1S2, RRR Pulmonary: LCTA MSK: ambulatory with walker Skin: warm and dry Neuro:  +generalized weakness Psych: non-anxious affect, A and O x 3, forgetful Thank you for the opportunity to participate in the care of Laura Santiago. Please call our office at [phone] if we can be of additional assistance.   Rashan Rounsaville Ihor Gully, NP

## 2023-01-10 ENCOUNTER — Encounter (HOSPITAL_BASED_OUTPATIENT_CLINIC_OR_DEPARTMENT_OTHER): Payer: Medicare Other | Admitting: Internal Medicine

## 2023-01-10 DIAGNOSIS — L03115 Cellulitis of right lower limb: Secondary | ICD-10-CM

## 2023-01-10 DIAGNOSIS — I87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: Secondary | ICD-10-CM | POA: Diagnosis not present

## 2023-01-10 DIAGNOSIS — L97822 Non-pressure chronic ulcer of other part of left lower leg with fat layer exposed: Secondary | ICD-10-CM | POA: Diagnosis not present

## 2023-01-11 NOTE — Progress Notes (Signed)
BARBI, KUMAGAI ([id number]) [id number] Page 1 of 13 Visit Report for 01/10/2023 Chief Complaint Document Details Patient Name: Date of Service: Laura Ace. 01/10/2023 10:00 [place] Record Number: [id number] Patient Account Number: [id number] Date of Birth/Sex: Treating RN: [birth year] (77 y.o. [name] Primary Care Provider: [place] Other Clinician: [name] Referring Provider: Treating Provider/Extender: [name] Weeks in Treatment: 49 Information Obtained from: Patient Chief Complaint Left lower extremity wound Right toe wounds Left upper lateral thigh wounds Electronic Signature(s) Signed: 01/10/2023 11:14:54 AM By: Kalman Shan DO Entered By: Kalman Shan on 01/10/2023 11:02:14 -------------------------------------------------------------------------------- Debridement Details Patient Name: Date of Service: Laura Jewett [name] 01/10/2023 10:00 [place] Record Number: [id number] Patient Account Number: [id number] Date of Birth/Sex: Treating RN: 1945/12/28 (77 y.o. [name] Primary Care Provider: [place] Other Clinician: [name] Referring Provider: Treating Provider/Extender: [name] Weeks in Treatment: 79 Debridement Performed for Assessment: Wound #1 Left,Medial Lower Leg Performed By: Physician Kalman Shan, MD Debridement Type: Debridement Severity of Tissue Pre Debridement: Fat layer exposed Level of Consciousness (Pre-procedure): Awake and Alert Pre-procedure Verification/Time Out Yes - 10:46 Taken: Start Time: 10:46 T Area Debrided (L x W): otal 0.5 (cm) x 0.5 (cm) = 0.25 (cm) Tissue and other material debrided: Viable, Non-Viable, Skin: Dermis , Skin: Epidermis Level: Skin/Epidermis Debridement Description: Selective/Open Wound Instrument: Curette Bleeding: Minimum Hemostasis Achieved:  Pressure Response to Treatment: Procedure was tolerated well Level of Consciousness (Post- Awake and Alert Laura Santiago, Laura Santiago ([id number]) [id number] Page 2 of 13 Awake and Alert procedure): Post Debridement Measurements of Total Wound Length: (cm) 1.5 Width: (cm) 0.5 Depth: (cm) 0.1 Volume: (cm) 0.059 Character of Wound/Ulcer Post Debridement: Stable Severity of Tissue Post Debridement: Fat layer exposed Post Procedure Diagnosis Same as Pre-procedure Electronic Signature(s) Signed: 01/10/2023 11:14:54 AM By: Kalman Shan DO Signed: 01/10/2023 4:51:29 PM By: [name] Signed: 01/10/2023 7:35:08 PM By: Gretta Cool, BSN, RN, CWS, [name] RN, BSN Entered By: [name] on 01/10/2023 10:48:58 -------------------------------------------------------------------------------- HPI Details Patient Name: Date of Service: Laura Jewett [name] 01/10/2023 10:00 A M Medical Record Number: [id number] Patient Account Number: [id number] Date of Birth/Sex: Treating RN: 1946/01/09 (77 y.o. [name] Primary Care Provider: [place] Other Clinician: [name] Referring Provider: Treating Provider/Extender: Kalman Shan [facility] Weeks in Treatment: 71 History of Present Illness HPI Description: Admission 7/27 Ms. Laura Santiago is a 77 year old female with a past medical history of ADHD, metastatic breast cancer, stage IV chronic kidney disease, history of DVT on Xarelto and chronic venous insufficiency that presents to the clinic for a chronic left lower extremity wound. She recently moved to [facility] 4 days ago. She was being followed by wound care center in [name]. She reports a 10-year history of wounds to her left lower extremity that eventually do heal with debridement and compression therapy. She states that the current wound reopened 4 months ago and she is using Vaseline and Coban. She denies signs of  infection. 8/3; patient presents for 1 week follow-up. She reports no issues or complaints today. She states she had vascular studies done in the last week. She denies signs of infection. She brought her little service dog with her today. 8/17; patient presents for follow-up. She has missed her last clinic appointment. She states she took the wrap off and attempted to rewrap her leg. She is having difficulty  with transportation. She has her service dog with her today. Overall she feels well and reports improvement in wound healing. She denies signs of infection. She reports owning an old Velcro wrap compression and has this at her living facility 9/14; patient presents for follow-up. Patient states that over the past 2 to 3 weeks she developed toe wounds to her right foot. She attributes this to tight fitting shoes. She subsequently developed cellulitis in the right leg and has been treated by doxycycline by her oncologist. She reports improvement in symptoms however continues to have some redness and swelling to this leg. T the left lower extremity patient has been having her wraps changed with home health twice weekly. She states that the [facility] is not helping control o the drainage. Other than that she has no issues or complaints today. She denies signs of infection to the left lower extremity. 9/21; patient presents for follow-up. She reports seeing infectious disease for her cellulitis. She reports no further management. She has home health that changes the wraps twice weekly. She has no issues or complaints today. She denies signs of infection. 10/5; patient presents for follow-up. She has no issues or complaints today. She denies signs of infection. She states that the right great toe has not been dressed by home health. 10/12; patient presents for follow-up. She has no issues or complaints today. She reports improvement in her wound healing. She has been using silver alginate to the  right great toe wound. She denies signs of infection. 10/26; patient presents for follow-up. Home health did not have sorbact so they continued to use Hydrofera Blue under the wrap. She has been using silver alginate to the great toe wound however she did not have a dressing in place today. She currently denies signs of infection. 11/2; patient presents for follow-up. She has been using sorb act under the compression wrap. She reports using silver alginate to the toe wound again she does not have a dressing in place. She currently denies signs of infection. 11/23; patient presents for follow-up. Unfortunately she has missed her last 2 clinic appointments. She was last seen 3 weeks ago. She did her own Laura Santiago, Laura Santiago ([id number]) [id number] Page 3 of 13 compression wrap with Kerlix and Coban yesterday after seeing vein and vascular. She has not been dressing her right great toe wound. She currently denies signs of infection. 11/30; patient presents for 1 week follow-up. She states she changed her dressing last week prior to home health and use sorb act with Dakin's and Hydrofera Blue. Home health has changed the dressing as well and they have been using sorbact. T oday she reports increased redness to her right lower extremity. She has a history of cellulitis to this leg. She has been using silver alginate to the right great toe. Unfortunately she had an episode of diarrhea prior to coming in and had feces all over the right leg and to the wrap of her left leg. 12/7; patient presents for 1 week follow-up. She states that home health did not come out to change the dressing and she took it off yesterday. It is unclear if she is dressing the right toe wound. She denies signs of infection. 12/14; patient presents for 1 week follow-up. She has no issues or complaints today. 12/21; patient presents for follow-up. She has no issues or complaints today. She denies signs of  infection. 12/28/2021; patient presents for follow-up. She was hospitalized for sepsis secondary to right lower extremity cellulitis  On 12/23. She states she is currently at a SNF. She states that she was started on doxycycline this morning for her right great toe swelling and redness. She is not sure what dressings have been done to her left lower extremity for the past 3 weeks. She says its been mainly gauze with an Ace wrap. 1/25; patient presents for follow-up. She is still residing in a skilled nursing facility. She reports mild pain to the left lower extremity wound bed. She states she is going to see a podiatrist soon. 2/8; patient presents for follow-up. She has moved back to her residential community from her skilled nursing facility. She has no issues or complaints today. She denies signs of systemic infections. 2/15; patient presents for follow-up. He has no issues or complaints today. She denies systemic signs of infection. 2/22; patient presents for follow-up. She has no issues or complaints today. She denies signs of infection. 3/1; patient presents for follow-up. She states that home health came out the day after she was seen in our clinic and yesterday to do the wrap change. She denies signs of infection. She reports excoriated skin on the ankle. 3/8; patient presents for follow-up. She has no issues or complaints today. She denies signs of infection. 3/15; patient presents for follow-up. Home health has been coming out to change the dressings. She reports more tenderness to the wound site. She denies purulent drainage, increased warmth or erythema to the area. 4/5; patient presents for follow-up. She has missed her last 2 clinic appointments. I have not seen her in 3 weeks. She was recently hospitalized for altered mental status. She was involuntarily committed. She was evaluated by psychiatry and deemed to have competency. There was no specific cause of her altered mental status. It  was concluded that her physical and mental health were declining due to her chronic medical conditions. Currently home health has been coming out for dressing changes. Patient has also been doing her own dressing changes. She reports more skin breakdown to the periwound and now has a new wound. She denies fever/chills. She reports continued tenderness to the wound site. 4/12; patient with significant venous insufficiency and a large wound on her left lower leg taking up about 80% of the circumference of her lower leg. Cultures of this grew MRSA and Pseudomonas. She had completed a course of ciprofloxacin now is starting doxycycline. She has been using Dakin's wet-to-dry and a Tubigrip. She has home health twice a week and we change it once. 4/19; patient presents for follow-up. She completed her course of doxycycline. She has been using Dakin's wet-to-dry dressing and Tubigrip. Home health changes the dressing twice weekly. Currently she has no issues or complaints. 4/26; patient presents for follow-up. At last clinic visit orders for home health were Iodosorb under compression therapy. Unfortunately they did not have the dressing and have been using Dakin's and gentamicin under the wrap. Patient currently denies signs of infection. She has no issues or complaints today. 5/3; patient presents for follow-up. Again Iodosorb has not been used under the compression therapy when home health comes out to change the wrap and dressing. They have been using Sorbact. It is unclear why this is happening since we send orders weekly to the agency. She denies signs of infection. Patient has not purchased the [place] antibiotics. We reached out to the company and they said they have been trying to contact her on a regular basis. We gave the patient the number to call to order the  medication. 5/10; patient presents for follow-up. She has no issues or complaints today. Again home health has not been using Iodosorb.  Mepilex was on the wound bed. No other dressings noted. She brought in her Keystone antibiotics. She denies signs of infection. 5/17; patient presents for follow-up. Home health has come out twice since she was last seen. Joint well she has been using Keystone antibiotic with Sorbact under the compression wrap. She has no issues or complaints today. She denies signs of infection. 5/24; patient presents for follow-up. We have been using Keystone antibiotics with Sorbact under compression therapy. She is tolerating the treatment well. She is reporting improvement in wound healing. She denies signs of infection. 5/31; patient presents for follow-up. We continue to do [facility] antibiotics with Sorbact under compression therapy. She continues to report improvement in wound healing. Home health comes out and changes the dressing once weekly. 05-17-2022 upon evaluation today patient appears to be doing better in regard to her wound especially compared to the last time I saw her. Fortunately I do think that she is seeing improvements. With that being said I do believe that she may be benefit from sharp debridement today to clear away some of the necrotic debris I discussed that with her as well. She is an amendable to that plan. Otherwise she is very pleased with how the [employer] is doing for her. 6/14; patient presents for follow-up. We have been using Keystone antibiotic with Sorbact and absorbent dressings under 3 layer compression. She has no issues or complaints today. She reports improvement in wound healing. She denies signs of infection. 6/21; patient presents for follow-up. We are continuing with [facility] antibiotic and Sorbact under 3 layer compression. Patient has no complaints. Continued wound healing is happening. She denies signs of infection. 6/28; patient presents for follow-up. We have been using Keystone antibiotic with Sorbact under 3 layer compression. Usually home health comes out  and changes the dressing twice a week. Unfortunately they did not go out to change the dressing. It is unclear why. Patient did not call them. She currently denies signs of infection. 7/5; patient presents for follow-up. We have been using Keystone antibiotic with calcium alginate under 3 layer compression. She reports improvement in wound healing. She denies signs of infection. Home health has come out to do dressing changes twice this past week. 7/12; patient presents for follow-up. We have been using Keystone antibiotic with calcium alginate under 3 layer compression. Patient states that home health came out once last week to change the dressing. She reports improvement in wound healing. She currently denies signs of infection. 7/19; patient presents for follow-up. We have been using Keystone antibiotic with calcium alginate under 3 layer compression. Home health came out once last week to change the dressing. She has no issues or complaints today. She denies signs of infection. Laura Santiago, Laura Santiago ([id number]) [id number] Page 4 of 13 8/2; patient presents for follow-up. We have been using Keystone antibiotic with calcium alginate under 3 layer compression. Unfortunately she missed her appointment last week and home health did not come out to do dressing changes. Patient currently denies signs of infection. 8/9; patient presents for follow-up. We have been using Keystone with calcium alginate under 3 layer compression. She states that home health came out once last week. She currently denies signs of infection. Her wrap was completely wet. She states she was cleaning the top of the leg and water soaked down into the wrap. 8/16; patient presents for follow-up. We  have been using Keystone with calcium alginate under 3 layer compression. She states that home health came out twice last week. She has no issues or complaints today. 8/23; patient presents for follow-up. He has  been using Keystone with calcium alginate under 3 layer compression. Home health came out twice last week. She denies signs of infection. 8/30; patient presents for follow-up. We have been using Keystone with calcium alginate under 3 layer compression. Home health came out once last week to change the dressing. Patient reports improvement in wound healing. She states she is almost done with her chemotherapy infusions and has 1 more left. 9/13; patient presents for follow-up. She has lost the capsules to her [facility] antibiotic which I believe is the vancomycin pills. She has her Zosyn powder today. We have been using Keystone antibiotic ointment with calcium alginate under 3 layer compression. She is concerned about systemic infection however her vitals are stable and there is no surrounding soft tissue infection. She would like to remain a patient in our wound care center however would like a second opinion for her wound care at another facility. She asked to be referred to [facility] wound care center. 9/20; patient presents for follow-up. She found her vancomycin capsules and brought in her complete Keystone antibiotic ointment set today. Unfortunately she has developed skin breakdown and Erythema to the right lower extremity With increased swelling. She states she went to a pow wow Over the weekend and was on her feet for extended periods of time. She saw her oncologist yesterday who prescribed her doxycycline for her right lower extremity erythema. 9/27; patient presents for follow-up. We have been using Keystone antibiotic with Aquacel under 3 layer compression to the lower extremities bilaterally. When home health came and changed the wrap she secretly put coffee into the spray mix along with [facility] antibiotic on her leg thinking the acidic component would better activate the zoysn (sonething she discussed with her microbiologist brother). She has reported improvement in wound healing. 10/4; patient  presents for follow-up. She has no issues or complaints today. We have been doing Aquacel and keystone under 3 layer compression to the lower extremities bilaterally. This morning she took the right lower extremity wrap off as it was uncomfortable. She has no open wounds to this leg. 10/11; patient presents for follow-up. We have been doing Aquacel with Keystone antibiotic ointment under 3 layer compression to the left lower extremity. She developed a small blister to the anterior aspect of the left leg noticed when the wrap was taken off on intake. She currently denies signs of infection. 10/18; patient presents for follow-up. We have been doing Aquacel with Keystone antibiotic ointment under 3 layer compression to the left lower extremity. There has been continued improvement in wound healing. She denies signs of infection. 10/25; patient arrives for treatment of venous insufficiency ulcers on her left lower leg both lateral and medial are remanence of apparently a circumferential wound. Much improved. We are using topicals Keystone and Aquacel Ag under 3 layer compression we continue to make good progress. The patient talk to me at some length with regards to different things she has on her forehead and her Peri orbital area for which she is apparently applying Elliott. She feels that what ever we are treating on her wounds is a more systemic problem. I really was not able to get a handle on what she is talking about however I did caution her not to put the [place] in her eyes. 11/1;  her wounds continue to improve she is using Keystone and Aquacel Ag G under 3 layer compression. Our intake nurse notes erythema and edema in the right leg. The patient has a litany of concerns with regards to a rash on her forehead or ears and other systemic complaints. She has an appointment with dermatology on November 11 11/8; patient presents for follow-up. We have been using Keystone and Aquacel under 4-layer  compression. She has no issues or complaints today. She reports improvement in wound healing. 11/15; patient presents for follow-up. We have been using Aquacel with Keystone antibiotic under 3 layer compression. Patient continues improvement in wound healing. 12/6; patient presents for follow-up. We have been using Aquacel with Keystone antibiotic ointment under 3 layer compression. Wounds appear well-healing. 12/13; patient presents for follow-up. We have been using Aquacel with Keystone antibiotic under 3 layer compression. She has no issues or complaints today. 12/27 left lateral medial ankle. Superficial wounds remain there is significantly improved we are using Keystone backed with Zetuvit under 4-layer compression 1/3; patient presents for follow-up. Her wounds appear well-healing. We have been using Aquacel Ag with Keystone antibiotic ointment under compression therapy. This should be a 3 layer compression. 1/17; patient presents for follow-up. Her wounds on her left lower extremity are well-healing. We are using Aquacel Ag and Keystone antibiotic ointment under compression therapy. She missed her last clinic appointment. She states that the wrap has been changed twice weekly since she was last seen. Unfortunately she has developed increased warmth and redness to the right lower extremity consistent with cellulitis. She states this started a few days ago. 1/24; patient presents for follow-up. Her wounds on the left lower extremity are well-healing with Aquacel and Keystone antibiotic ointment under compression therapy. I prescribed Keflex at last clinic visit for cellulitis of the right lower extremity. Unfortunately this has not resolved. She did not follow-up with her PCP for contact our office about her symptoms. 1/31; patient presents for follow-up. We have been using Aquacel and Keystone antibiotic ointment under compression therapy to the left lower extremity. Wounds appear well-healing.  She has been taking clindamycin for the past week. Her symptoms have improved slightly with a decrease in erythema and warmth to the right lower extremity. She says her pain level has improved. However Symptoms have not completely resolved. She denies fever/chills, nausea/vomiting. Electronic Signature(s) Signed: 01/10/2023 11:14:54 AM By: Kalman Shan DO Entered By: Kalman Shan on 01/10/2023 11:04:13 [name] ([id number]) [id number] Page 5 of 13 -------------------------------------------------------------------------------- Physical Exam Details Patient Name: Date of Service: Laura Ace. 01/10/2023 10:00 [place] Record Number: [id number] Patient Account Number: [id number] Date of Birth/Sex: Treating RN: August 29, 1946 (77 y.o. [name] Primary Care Provider: [place] Other Clinician: [name] Referring Provider: Treating Provider/Extender: Kalman Shan [facility] Weeks in Treatment: 5 Constitutional . Psychiatric . Notes T the left lower extremity there are areas with nonviable tissue. Postdebridement there are a few small scattered wounds to the lateral and medial aspect with o granulation tissue. No signs of infection to the sites. T the right lower extremity there is increased warmth and erythema However area of erythema has o decreased in size. Intensity of redness has also decreased. Electronic Signature(s) Signed: 01/10/2023 11:14:54 AM By: Kalman Shan DO Entered By: Kalman Shan on 01/10/2023 11:07:16 -------------------------------------------------------------------------------- Physician Orders Details Patient Name: Date of Service: Laura Jewett [name] 01/10/2023 10:00 A M Medical Record Number: [id number] Patient Account Number: [id number]  Date of Birth/Sex: Treating RN: [birth year] (77 y.o. [name] Primary Care Provider: [place] Other Clinician:  [name] Referring Provider: Treating Provider/Extender: [name] Weeks in Treatment: 63 Verbal / Phone Orders: No Diagnosis Coding Follow-up Appointments Return Appointment in 1 week. Nurse Visit as needed [place]: Rocky Morel [phone] [place] for wound care. May utilize formulary equivalent dressing for wound treatment orders unless otherwise specified. Home Health Nurse may visit PRN to address patients wound care needs. - home health to see patient 2 times per week, patient to be seen at wound clinic once per week **Please direct any NON-WOUND related issues/requests for orders to patient's Primary Care Physician. **If current dressing causes regression in wound condition, may D/C ordered dressing product/s and apply Normal Saline Moist Dressing daily until next [place] or Other MD appointment. **Notify [facility] of regression in wound condition at [phone]. Other Home Health Orders/Instructions: KEIVA, DINA ([id number]) [id number] Page 6 of 13 Bathing/ Shower/ Hygiene May shower with wound dressing protected with water repellent cover or cast protector. No tub bath. Anesthetic (Use 'Patient Medications' Section for Anesthetic Order Entry) Lidocaine applied to wound bed Edema Control - Lymphedema / Segmental Compressive Device / Other Optional: One layer of unna paste to top of compression wrap (to act as an anchor). Elevate, Exercise Daily and A void Standing for Long Periods of Time. Elevate legs to the level of the heart and pump ankles as often as possible Elevate leg(s) parallel to the floor when sitting. DO YOUR BEST to sleep in the bed at night. DO NOT sleep in your recliner. Long hours of sitting in a recliner leads to swelling of the legs and/or potential wounds on your backside. Additional Orders / Instructions Follow Nutritious Diet  and Increase Protein Intake Other: - recommend going to ED for cellulitis in right lower leg if no better in 2 days Medications-Please add to medication list. Keystone Compound ntibiotics - start clindamycin right leg cellulitis P.O. A Wound Treatment Wound #1 - Lower Leg Wound Laterality: Left, Medial Cleanser: Wound Cleanser 3 x Per Week/30 Days Discharge Instructions: Wash your hands with soap and water. Remove old dressing, discard into plastic bag and place into trash. Cleanse the wound with Wound Cleanser prior to applying a clean dressing using gauze sponges, not tissues or cotton balls. Do not scrub or use excessive force. Pat dry using gauze sponges, not tissue or cotton balls. Topical: Triamcinolone Acetonide Cream, 0.1%, 15 (g) tube 3 x Per Week/30 Days Discharge Instructions: lower leg Prim Dressing: AquacelAg Advantage Dressing, 2X2 (in/in) 3 x Per Week/30 Days ary Discharge Instructions: Apply to wound as directed Secondary Dressing: Zetuvit Plus 4x8 (in/in) 3 x Per Week/30 Days Compression Wrap: Medichoice 4 layer Compression System, 35-40 mmHG 3 x Per Week/30 Days Discharge Instructions: Apply multi-layer wrap as directed. Wound #8 - Lower Leg Wound Laterality: Left, Lateral Cleanser: Wound Cleanser 3 x Per Week/30 Days Discharge Instructions: Wash your hands with soap and water. Remove old dressing, discard into plastic bag and place into trash. Cleanse the wound with Wound Cleanser prior to applying a clean dressing using gauze sponges, not tissues or cotton balls. Do not scrub or use excessive force. Pat dry using gauze sponges, not tissue or cotton balls. Topical: Triamcinolone Acetonide Cream, 0.1%, 15 (g) tube 3 x Per Week/30 Days Discharge Instructions: Mix 1:1 with Nystatin cream and apply to periwound and leg Prim  Dressing: AquacelAg Advantage Dressing, 2X2 (in/in) 3 x Per Week/30 Days ary Discharge Instructions: Apply to wound as directed Secondary Dressing:  Zetuvit Plus 4x8 (in/in) 3 x Per Week/30 Days Compression Wrap: Medichoice 4 layer Compression System, 35-40 mmHG 3 x Per Week/30 Days Discharge Instructions: Apply multi-layer wrap as directed. Patient Medications llergies: Celebrex A Notifications Medication Indication Start End 01/10/2023 clindamycin HCl DOSE 1 - oral 300 mg capsule - 1 capsule oral four times daily x 7 days Electronic Signature(s) Signed: 01/10/2023 11:14:54 AM By: Kalman Shan DO Previous Signature: 01/10/2023 11:14:17 AM Version By: Kalman Shan DO Entered By: Kalman Shan on 01/10/2023 11:14:27 [name] ([id number]) [id number] Page 7 of 13 -------------------------------------------------------------------------------- Problem List Details Patient Name: Date of Service: Laura Ace. 01/10/2023 10:00 [place] Record Number: [id number] Patient Account Number: [id number] Date of Birth/Sex: Treating RN: 01/14/1946 (77 y.o. Charolette Forward, Maudie Mercury Primary Care Provider: [place] Other Clinician: [name] Referring Provider: Treating Provider/Extender: Kalman Shan [facility] Weeks in Treatment: 81 Active Problems ICD-10 Encounter Code Description Active Date MDM Diagnosis [phone] Non-pressure chronic ulcer of other part of left lower leg with fat layer exposed11/23/2022 No Yes 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 11/02/2021 No Yes I87.311 Chronic venous hypertension (idiopathic) with ulcer of right lower extremity 08/30/2022 No Yes I87.2 Venous insufficiency (chronic) (peripheral) 07/06/2021 No Yes Z79.01 Long term (current) use of anticoagulants 07/06/2021 No Yes I10 Essential (primary) hypertension 07/06/2021 No Yes 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 07/06/2021 No Yes L03.115 Cellulitis of right lower limb 12/27/2022 No Yes Inactive Problems ICD-10 Code Description Active  Date Inactive Date S81.802A Unspecified open wound, left lower leg, initial encounter 07/06/2021 07/06/2021 S91.101A Unspecified open wound of right great toe without damage to nail, initial encounter 08/24/2021 08/24/2021 S91.104A Unspecified open wound of right lesser toe(s) without damage to nail, initial encounter 08/24/2021 08/24/2021 Resolved Problems Laura Santiago, Laura Santiago ([id number]) [id number] Page 8 of 13 ICD-10 Code Description Active Date Resolved Date S91.104D Unspecified open wound of right lesser toe(s) without damage to nail, subsequent 08/31/2021 08/31/2021 encounter S91.201D Unspecified open wound of right great toe with damage to nail, subsequent encounter 08/31/2021 08/31/2021 Electronic Signature(s) Signed: 01/10/2023 11:14:54 AM By: Kalman Shan DO Entered By: Kalman Shan on 01/10/2023 11:01:54 -------------------------------------------------------------------------------- Progress Note Details Patient Name: Date of Service: Laura Jewett [name] 01/10/2023 10:00 [place] Record Number: [id number] Patient Account Number: [id number] Date of Birth/Sex: Treating RN: [birth year] (77 y.o. [name] Primary Care Provider: [place] Other Clinician: [name] Referring Provider: Treating Provider/Extender: Kalman Shan [facility] Weeks in Treatment: 55 Subjective Chief Complaint Information obtained from Patient Left lower extremity wound Right toe wounds Left upper lateral thigh wounds History of Present Illness (HPI) Admission 7/27 Ms. Shanin Szymanowski is a 77 year old female with a past medical history of ADHD, metastatic breast cancer, stage IV chronic kidney disease, history of DVT on Xarelto and chronic venous insufficiency that presents to the clinic for a chronic left lower extremity wound. She recently moved to [facility] 4 days ago. She was being followed by wound care center in [name]. She  reports a 10-year history of wounds to her left lower extremity that eventually do heal with debridement and compression therapy. She states that the current wound reopened 4 months ago and she is using Vaseline and Coban. She denies signs of infection. 8/3; patient presents for 1 week follow-up.  She reports no issues or complaints today. She states she had vascular studies done in the last week. She denies signs of infection. She brought her little service dog with her today. 8/17; patient presents for follow-up. She has missed her last clinic appointment. She states she took the wrap off and attempted to rewrap her leg. She is having difficulty with transportation. She has her service dog with her today. Overall she feels well and reports improvement in wound healing. She denies signs of infection. She reports owning an old Velcro wrap compression and has this at her living facility 9/14; patient presents for follow-up. Patient states that over the past 2 to 3 weeks she developed toe wounds to her right foot. She attributes this to tight fitting shoes. She subsequently developed cellulitis in the right leg and has been treated by doxycycline by her oncologist. She reports improvement in symptoms however continues to have some redness and swelling to this leg. T the left lower extremity patient has been having her wraps changed with home health twice weekly. She states that the [facility] is not helping control o the drainage. Other than that she has no issues or complaints today. She denies signs of infection to the left lower extremity. 9/21; patient presents for follow-up. She reports seeing infectious disease for her cellulitis. She reports no further management. She has home health that changes the wraps twice weekly. She has no issues or complaints today. She denies signs of infection. 10/5; patient presents for follow-up. She has no issues or complaints today. She denies signs of  infection. She states that the right great toe has not been dressed by home health. 10/12; patient presents for follow-up. She has no issues or complaints today. She reports improvement in her wound healing. She has been using silver alginate to the right great toe wound. She denies signs of infection. 10/26; patient presents for follow-up. Home health did not have sorbact so they continued to use Hydrofera Blue under the wrap. She has been using silver alginate to the great toe wound however she did not have a dressing in place today. She currently denies signs of infection. 11/2; patient presents for follow-up. She has been using sorb act under the compression wrap. She reports using silver alginate to the toe wound again she does not have a dressing in place. She currently denies signs of infection. 11/23; patient presents for follow-up. Unfortunately she has missed her last 2 clinic appointments. She was last seen 3 weeks ago. She did her own compression wrap with Kerlix and Coban yesterday after seeing vein and vascular. She has not been dressing her right great toe wound. She currently denies Laura Santiago, Laura Santiago ([id number]) [id number] Page 9 of 13 signs of infection. 11/30; patient presents for 1 week follow-up. She states she changed her dressing last week prior to home health and use sorb act with Dakin's and Hydrofera Blue. Home health has changed the dressing as well and they have been using sorbact. T oday she reports increased redness to her right lower extremity. She has a history of cellulitis to this leg. She has been using silver alginate to the right great toe. Unfortunately she had an episode of diarrhea prior to coming in and had feces all over the right leg and to the wrap of her left leg. 12/7; patient presents for 1 week follow-up. She states that home health did not come out to change the dressing and she took it off yesterday. It is unclear  if she  is dressing the right toe wound. She denies signs of infection. 12/14; patient presents for 1 week follow-up. She has no issues or complaints today. 12/21; patient presents for follow-up. She has no issues or complaints today. She denies signs of infection. 12/28/2021; patient presents for follow-up. She was hospitalized for sepsis secondary to right lower extremity cellulitis On 12/23. She states she is currently at a SNF. She states that she was started on doxycycline this morning for her right great toe swelling and redness. She is not sure what dressings have been done to her left lower extremity for the past 3 weeks. She says its been mainly gauze with an Ace wrap. 1/25; patient presents for follow-up. She is still residing in a skilled nursing facility. She reports mild pain to the left lower extremity wound bed. She states she is going to see a podiatrist soon. 2/8; patient presents for follow-up. She has moved back to her residential community from her skilled nursing facility. She has no issues or complaints today. She denies signs of systemic infections. 2/15; patient presents for follow-up. He has no issues or complaints today. She denies systemic signs of infection. 2/22; patient presents for follow-up. She has no issues or complaints today. She denies signs of infection. 3/1; patient presents for follow-up. She states that home health came out the day after she was seen in our clinic and yesterday to do the wrap change. She denies signs of infection. She reports excoriated skin on the ankle. 3/8; patient presents for follow-up. She has no issues or complaints today. She denies signs of infection. 3/15; patient presents for follow-up. Home health has been coming out to change the dressings. She reports more tenderness to the wound site. She denies purulent drainage, increased warmth or erythema to the area. 4/5; patient presents for follow-up. She has missed her last 2 clinic  appointments. I have not seen her in 3 weeks. She was recently hospitalized for altered mental status. She was involuntarily committed. She was evaluated by psychiatry and deemed to have competency. There was no specific cause of her altered mental status. It was concluded that her physical and mental health were declining due to her chronic medical conditions. Currently home health has been coming out for dressing changes. Patient has also been doing her own dressing changes. She reports more skin breakdown to the periwound and now has a new wound. She denies fever/chills. She reports continued tenderness to the wound site. 4/12; patient with significant venous insufficiency and a large wound on her left lower leg taking up about 80% of the circumference of her lower leg. Cultures of this grew MRSA and Pseudomonas. She had completed a course of ciprofloxacin now is starting doxycycline. She has been using Dakin's wet-to-dry and a Tubigrip. She has home health twice a week and we change it once. 4/19; patient presents for follow-up. She completed her course of doxycycline. She has been using Dakin's wet-to-dry dressing and Tubigrip. Home health changes the dressing twice weekly. Currently she has no issues or complaints. 4/26; patient presents for follow-up. At last clinic visit orders for home health were Iodosorb under compression therapy. Unfortunately they did not have the dressing and have been using Dakin's and gentamicin under the wrap. Patient currently denies signs of infection. She has no issues or complaints today. 5/3; patient presents for follow-up. Again Iodosorb has not been used under the compression therapy when home health comes out to change the wrap and dressing. They have  been using Sorbact. It is unclear why this is happening since we send orders weekly to the agency. She denies signs of infection. Patient has not purchased the [place] antibiotics. We reached out to the company  and they said they have been trying to contact her on a regular basis. We gave the patient the number to call to order the medication. 5/10; patient presents for follow-up. She has no issues or complaints today. Again home health has not been using Iodosorb. Mepilex was on the wound bed. No other dressings noted. She brought in her Keystone antibiotics. She denies signs of infection. 5/17; patient presents for follow-up. Home health has come out twice since she was last seen. Joint well she has been using Keystone antibiotic with Sorbact under the compression wrap. She has no issues or complaints today. She denies signs of infection. 5/24; patient presents for follow-up. We have been using Keystone antibiotics with Sorbact under compression therapy. She is tolerating the treatment well. She is reporting improvement in wound healing. She denies signs of infection. 5/31; patient presents for follow-up. We continue to do [facility] antibiotics with Sorbact under compression therapy. She continues to report improvement in wound healing. Home health comes out and changes the dressing once weekly. 05-17-2022 upon evaluation today patient appears to be doing better in regard to her wound especially compared to the last time I saw her. Fortunately I do think that she is seeing improvements. With that being said I do believe that she may be benefit from sharp debridement today to clear away some of the necrotic debris I discussed that with her as well. She is an amendable to that plan. Otherwise she is very pleased with how the [employer] is doing for her. 6/14; patient presents for follow-up. We have been using Keystone antibiotic with Sorbact and absorbent dressings under 3 layer compression. She has no issues or complaints today. She reports improvement in wound healing. She denies signs of infection. 6/21; patient presents for follow-up. We are continuing with [facility] antibiotic and Sorbact under 3 layer  compression. Patient has no complaints. Continued wound healing is happening. She denies signs of infection. 6/28; patient presents for follow-up. We have been using Keystone antibiotic with Sorbact under 3 layer compression. Usually home health comes out and changes the dressing twice a week. Unfortunately they did not go out to change the dressing. It is unclear why. Patient did not call them. She currently denies signs of infection. 7/5; patient presents for follow-up. We have been using Keystone antibiotic with calcium alginate under 3 layer compression. She reports improvement in wound healing. She denies signs of infection. Home health has come out to do dressing changes twice this past week. 7/12; patient presents for follow-up. We have been using Keystone antibiotic with calcium alginate under 3 layer compression. Patient states that home health came out once last week to change the dressing. She reports improvement in wound healing. She currently denies signs of infection. 7/19; patient presents for follow-up. We have been using Keystone antibiotic with calcium alginate under 3 layer compression. Home health came out once last week to change the dressing. She has no issues or complaints today. She denies signs of infection. Laura Santiago, Laura Santiago ([id number]) [id number] Page 10 of 13 8/2; patient presents for follow-up. We have been using Keystone antibiotic with calcium alginate under 3 layer compression. Unfortunately she missed her appointment last week and home health did not come out to do dressing changes. Patient currently denies signs of infection.  8/9; patient presents for follow-up. We have been using Keystone with calcium alginate under 3 layer compression. She states that home health came out once last week. She currently denies signs of infection. Her wrap was completely wet. She states she was cleaning the top of the leg and water soaked down into the  wrap. 8/16; patient presents for follow-up. We have been using Keystone with calcium alginate under 3 layer compression. She states that home health came out twice last week. She has no issues or complaints today. 8/23; patient presents for follow-up. He has been using Keystone with calcium alginate under 3 layer compression. Home health came out twice last week. She denies signs of infection. 8/30; patient presents for follow-up. We have been using Keystone with calcium alginate under 3 layer compression. Home health came out once last week to change the dressing. Patient reports improvement in wound healing. She states she is almost done with her chemotherapy infusions and has 1 more left. 9/13; patient presents for follow-up. She has lost the capsules to her [facility] antibiotic which I believe is the vancomycin pills. She has her Zosyn powder today. We have been using Keystone antibiotic ointment with calcium alginate under 3 layer compression. She is concerned about systemic infection however her vitals are stable and there is no surrounding soft tissue infection. She would like to remain a patient in our wound care center however would like a second opinion for her wound care at another facility. She asked to be referred to [facility] wound care center. 9/20; patient presents for follow-up. She found her vancomycin capsules and brought in her complete Keystone antibiotic ointment set today. Unfortunately she has developed skin breakdown and Erythema to the right lower extremity With increased swelling. She states she went to a pow wow Over the weekend and was on her feet for extended periods of time. She saw her oncologist yesterday who prescribed her doxycycline for her right lower extremity erythema. 9/27; patient presents for follow-up. We have been using Keystone antibiotic with Aquacel under 3 layer compression to the lower extremities bilaterally. When home health came and changed the wrap she  secretly put coffee into the spray mix along with [facility] antibiotic on her leg thinking the acidic component would better activate the zoysn (sonething she discussed with her microbiologist brother). She has reported improvement in wound healing. 10/4; patient presents for follow-up. She has no issues or complaints today. We have been doing Aquacel and keystone under 3 layer compression to the lower extremities bilaterally. This morning she took the right lower extremity wrap off as it was uncomfortable. She has no open wounds to this leg. 10/11; patient presents for follow-up. We have been doing Aquacel with Keystone antibiotic ointment under 3 layer compression to the left lower extremity. She developed a small blister to the anterior aspect of the left leg noticed when the wrap was taken off on intake. She currently denies signs of infection. 10/18; patient presents for follow-up. We have been doing Aquacel with Keystone antibiotic ointment under 3 layer compression to the left lower extremity. There has been continued improvement in wound healing. She denies signs of infection. 10/25; patient arrives for treatment of venous insufficiency ulcers on her left lower leg both lateral and medial are remanence of apparently a circumferential wound. Much improved. We are using topicals Keystone and Aquacel Ag under 3 layer compression we continue to make good progress. The patient talk to me at some length with regards to different things she  has on her forehead and her Peri orbital area for which she is apparently applying Keystone. She feels that what ever we are treating on her wounds is a more systemic problem. I really was not able to get a handle on what she is talking about however I did caution her not to put the [place] in her eyes. 11/1; her wounds continue to improve she is using Keystone and Aquacel Ag G under 3 layer compression. Our intake nurse notes erythema and edema in the right leg.  The patient has a litany of concerns with regards to a rash on her forehead or ears and other systemic complaints. She has an appointment with dermatology on November 11 11/8; patient presents for follow-up. We have been using Keystone and Aquacel under 4-layer compression. She has no issues or complaints today. She reports improvement in wound healing. 11/15; patient presents for follow-up. We have been using Aquacel with Keystone antibiotic under 3 layer compression. Patient continues improvement in wound healing. 12/6; patient presents for follow-up. We have been using Aquacel with Keystone antibiotic ointment under 3 layer compression. Wounds appear well-healing. 12/13; patient presents for follow-up. We have been using Aquacel with Keystone antibiotic under 3 layer compression. She has no issues or complaints today. 12/27 left lateral medial ankle. Superficial wounds remain there is significantly improved we are using Keystone backed with Zetuvit under 4-layer compression 1/3; patient presents for follow-up. Her wounds appear well-healing. We have been using Aquacel Ag with Keystone antibiotic ointment under compression therapy. This should be a 3 layer compression. 1/17; patient presents for follow-up. Her wounds on her left lower extremity are well-healing. We are using Aquacel Ag and Keystone antibiotic ointment under compression therapy. She missed her last clinic appointment. She states that the wrap has been changed twice weekly since she was last seen. Unfortunately she has developed increased warmth and redness to the right lower extremity consistent with cellulitis. She states this started a few days ago. 1/24; patient presents for follow-up. Her wounds on the left lower extremity are well-healing with Aquacel and Keystone antibiotic ointment under compression therapy. I prescribed Keflex at last clinic visit for cellulitis of the right lower extremity. Unfortunately this has not  resolved. She did not follow-up with her PCP for contact our office about her symptoms. 1/31; patient presents for follow-up. We have been using Aquacel and Keystone antibiotic ointment under compression therapy to the left lower extremity. Wounds appear well-healing. She has been taking clindamycin for the past week. Her symptoms have improved slightly with a decrease in erythema and warmth to the right lower extremity. She says her pain level has improved. However Symptoms have not completely resolved. She denies fever/chills, nausea/vomiting. Objective Constitutional Vitals Time Taken: 10:12 AM, Height: 66 in, Weight: 153 lbs, BMI: 24.7, Temperature: 98.2 F, Pulse: 80 bpm, Respiratory Rate: 18 breaths/min, Blood Pressure: 157/75 mmHg. Laura Santiago, Laura Santiago ([id number]) [id number] Page 11 of 13 General Notes: T the left lower extremity there are areas with nonviable tissue. Postdebridement there are a few small scattered wounds to the lateral and o medial aspect with granulation tissue. No signs of infection to the sites. T the right lower extremity there is increased warmth and erythema However area of o erythema has decreased in size. Intensity of redness has also decreased. Integumentary (Hair, Skin) Wound #1 status is Open. Original cause of wound was Gradually Appeared. The date acquired was: 04/06/2021. The wound has been in treatment 79 weeks. The wound is located on the Left,Medial  Lower Leg. The wound measures 0.1cm length x 0.1cm width x 0.1cm depth; 0.008cm^2 area and 0.001cm^3 volume. There is Fat Layer (Subcutaneous Tissue) exposed. There is a medium amount of serosanguineous drainage noted. The wound margin is flat and intact. There is medium (34-66%) red, pink granulation within the wound bed. There is a medium (34-66%) amount of necrotic tissue within the wound bed including Adherent Slough. Wound #8 status is Open. Original cause of wound was Pressure  Injury. The date acquired was: 10/18/2022. The wound has been in treatment 12 weeks. The wound is located on the Left,Lateral Lower Leg. The wound measures 0.4cm length x 1.5cm width x 0.1cm depth; 0.471cm^2 area and 0.047cm^3 volume. There is Fat Layer (Subcutaneous Tissue) exposed. There is a medium amount of serosanguineous drainage noted. There is medium (34-66%) pink granulation within the wound bed. There is a medium (34-66%) amount of necrotic tissue within the wound bed including Adherent Slough. 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 I debrided nonviable tissue. I recommended continuing the course with [facility] antibiotic ointment and Aquacel under 3 layer compression. The right lower extremity cellulitis has improved however has not resolved. I recommended continue with clindamycin for 1 more week. She knows to go to the ED if symptoms do not improve or worsen. 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 There was a Selective/Open Wound Skin/Epidermis Debridement with a total area of 0.25 sq cm performed by Kalman Shan, MD. With the following instrument(s): Curette to remove Viable and Non-Viable tissue/material. Material removed includes Skin: Dermis and Skin: Epidermis and. A time out was conducted at 10:46, prior to the start of the procedure. A Minimum amount of bleeding was  controlled with Pressure. The procedure was tolerated well. Post Debridement Measurements: 1.5cm length x 0.5cm width x 0.1cm depth; 0.059cm^3 volume. Character of Wound/Ulcer Post Debridement is stable. Severity of Tissue Post Debridement is: Fat layer exposed. 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 There was a Four Layer Compression Therapy Procedure with a pre-treatment ABI of 1.5 by [name]. Post procedure Diagnosis Wound #1: Same as Pre-Procedure Plan Follow-up Appointments: Return Appointment in 1 week. Nurse Visit as needed Home Health: [employer]: Rocky Morel [phone] Kathleen for wound care. May utilize formulary equivalent dressing for wound treatment orders unless otherwise specified. Home Health Nurse may visit PRN to address patientoos wound care needs. - home health to see patient 2 times per week, patient to be seen at wound clinic once per week **Please direct any NON-WOUND related issues/requests for orders to patient's Primary Care Physician. **If current dressing causes regression in wound condition, may D/C ordered dressing product/s and apply Normal Saline Moist Dressing daily until next [place] or Other MD appointment. **Notify [facility] of regression in wound condition at [phone]. Other Home Health Orders/Instructions: Bathing/ Shower/ Hygiene: May shower with wound dressing protected with water repellent cover or cast  protector. No tub bath. Anesthetic (Use 'Patient Medications' Section for Anesthetic Order Entry): Lidocaine applied to wound bed Edema Control - Lymphedema / Segmental Compressive Device / Other: Optional: One layer of unna paste to top of compression wrap (to act as an anchor). Elevate, Exercise Daily and Avoid Standing for Long Periods of Time. Elevate legs to the level of the  heart and pump ankles as often as possible Laura Santiago, Laura Santiago ([id number]) [id number] Page 12 of 13 Elevate leg(s) parallel to the floor when sitting. DO YOUR BEST to sleep in the bed at night. DO NOT sleep in your recliner. Long hours of sitting in a recliner leads to swelling of the legs and/or potential wounds on your backside. Additional Orders / Instructions: Follow Nutritious Diet and Increase Protein Intake Other: - recommend going to ED for cellulitis in right lower leg if no better in 2 days Medications-Please add to medication list.: Keystone Compound P.O. Antibiotics - start clindamycin right leg cellulitis WOUND #1: - Lower Leg Wound Laterality: Left, Medial Cleanser: Wound Cleanser 3 x Per Week/30 Days Discharge Instructions: Wash your hands with soap and water. Remove old dressing, discard into plastic bag and place into trash. Cleanse the wound with Wound Cleanser prior to applying a clean dressing using gauze sponges, not tissues or cotton balls. Do not scrub or use excessive force. Pat dry using gauze sponges, not tissue or cotton balls. Topical: Triamcinolone Acetonide Cream, 0.1%, 15 (g) tube 3 x Per Week/30 Days Discharge Instructions: lower leg Prim Dressing: AquacelAg Advantage Dressing, 2X2 (in/in) 3 x Per Week/30 Days ary Discharge Instructions: Apply to wound as directed Secondary Dressing: Zetuvit Plus 4x8 (in/in) 3 x Per Week/30 Days Com pression Wrap: Medichoice 4 layer Compression System, 35-40 mmHG 3 x Per Week/30 Days Discharge Instructions: Apply multi-layer wrap as directed. WOUND #8: - Lower Leg Wound Laterality: Left, Lateral Cleanser: Wound Cleanser 3 x Per Week/30 Days Discharge Instructions: Wash your hands with soap and water. Remove old dressing, discard into plastic bag and place into trash. Cleanse the wound with Wound Cleanser prior to applying a clean dressing using gauze sponges, not tissues or cotton balls. Do not  scrub or use excessive force. Pat dry using gauze sponges, not tissue or cotton balls. Topical: Triamcinolone Acetonide Cream, 0.1%, 15 (g) tube 3 x Per Week/30 Days Discharge Instructions: Mix 1:1 with Nystatin cream and apply to periwound and leg Prim Dressing: AquacelAg Advantage Dressing, 2X2 (in/in) 3 x Per Week/30 Days ary Discharge Instructions: Apply to wound as directed Secondary Dressing: Zetuvit Plus 4x8 (in/in) 3 x Per Week/30 Days Com pression Wrap: Medichoice 4 layer Compression System, 35-40 mmHG 3 x Per Week/30 Days Discharge Instructions: Apply multi-layer wrap as directed. 1. Continue clindamycin 2. Keystone antibiotic ointment with Aquacel under 3 layer compressionooleft lower extremity 3. Follow-up in 1 week Electronic Signature(s) Signed: 01/10/2023 11:14:54 AM By: Kalman Shan DO Entered By: Kalman Shan on 01/10/2023 11:08:36 -------------------------------------------------------------------------------- SuperBill Details Patient Name: Date of Service: Laura Jewett [name] 01/10/2023 Medical Record Number: [id number] Patient Account Number: [id number] Date of Birth/Sex: Treating RN: 23-Sep-1946 (77 y.o. [name] Primary Care Provider: [place] Other Clinician: [name] Referring Provider: Treating Provider/Extender: [name] Weeks in Treatment: 25 Diagnosis Coding ICD-10 Codes Code Description [phone] 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 I87.311 Chronic venous hypertension (idiopathic) with ulcer of  right lower extremity I87.2 Venous insufficiency (chronic) (peripheral) Z79.01 Long term (current) use of anticoagulants I10 Essential (primary) hypertension 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 Laura Santiago, Laura Santiago ([id number]) [id number]  Page 13 of 13 L03.115 Cellulitis of right lower limb Facility Procedures : CPT4 Code: [id number] Description: 18984 - DEBRIDE WOUND 1ST 20 SQ CM OR < ICD-10 Diagnosis Description 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 Modifier: d Quantity: 1 Physician Procedures : CPT4 Code Description Modifier [id number] - WC PHYS LEVEL 4 - EST PT ICD-10 Diagnosis Description L03.115 Cellulitis of right lower limb Quantity: 1 : [id number] - WC PHYS DEBR WO ANESTH 20 SQ CM ICD-10 Diagnosis Description 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 Quantity: 1 Electronic Signature(s) Signed: 01/10/2023 11:14:54 AM By: Kalman Shan DO Entered By: Kalman Shan on 01/10/2023 11:10:35

## 2023-01-11 NOTE — Progress Notes (Signed)
Laura, Santiago ([id number]) [id number] Page 1 of 11 Visit Report for 01/10/2023 Arrival Information Details Patient Name: Date of Service: Laura Santiago. 01/10/2023 10:00 [place] Record Number: [id number] Patient Account Number: [id number] Date of Birth/Sex: Treating RN: 1946/03/27 (77 y.o. Laura Santiago Primary Care Dmani Mizer: [place] Other Clinician: [name] Referring Laszlo Ellerby: Treating Juan Kissoon/Extender: [name] Weeks in Treatment: 72 Visit Information History Since Last Visit All ordered tests and consults were completed: No Patient Arrived: [name] Added or deleted any medications: No Arrival Time: 10:03 Any new allergies or adverse reactions: No Transfer Assistance: None Had a fall or experienced change in No Patient Requires Transmission-Based No activities of daily living that may affect Precautions: risk of falls: Patient Has Alerts: Yes Signs or symptoms of abuse/neglect since last visito No Patient Alerts: PT HAS SERVICE Hospitalized since last visit: No ANIMAL ABI 07/11/21 Implantable device outside of the clinic excluding No R) 1.16 L) 1.27 cellular tissue based products placed in the center since last visit: Has Dressing in Place as Prescribed: Yes Has Compression in Place as Prescribed: Yes Pain Present Now: No Electronic Signature(s) Signed: 01/10/2023 4:51:29 PM By: [name] Entered By: [name] on 01/10/2023 10:12:19 -------------------------------------------------------------------------------- Clinic Level of Care Assessment Details Patient Name: Date of Service: Laura Santiago. 01/10/2023 10:00 A M Medical Record Number: [id number] Patient Account Number: [id number] Date of Birth/Sex: Treating RN: 23-Jul-1946 (77 y.o. Laura Santiago Primary Care Cecilie Heidel: [place] Other Clinician: [name] Referring Fiore Detjen: Treating  Josseline Reddin/Extender: [name] Weeks in Treatment: 68 Clinic Level of Care Assessment Items TOOL 1 Quantity Score '[]'$  - 0 Use when EandM and Procedure is performed on INITIAL visit ASSESSMENTS - Nursing Assessment / Reassessment '[]'$  - 0 General Physical Exam (combine w/ comprehensive assessment (listed just below) when performed on new pt. [address] TEMECA, SOMMA ([id number]) [id number] Page 2 of 11 '[]'$  - 0 Comprehensive Assessment (HX, ROS, Risk Assessments, Wounds Hx, etc.) ASSESSMENTS - Wound and Skin Assessment / Reassessment '[]'$  - 0 Dermatologic / Skin Assessment (not related to wound area) ASSESSMENTS - Ostomy and/or Continence Assessment and Care '[]'$  - 0 Incontinence Assessment and Management '[]'$  - 0 Ostomy Care Assessment and Management (repouching, etc.) PROCESS - Coordination of Care '[]'$  - 0 Simple Patient / Family Education for ongoing care '[]'$  - 0 Complex (extensive) Patient / Family Education for ongoing care '[]'$  - 0 Staff obtains Programmer, systems, Records, T Results / Process Orders est '[]'$  - 0 Staff telephones HHA, Nursing Homes / Clarify orders / etc '[]'$  - 0 Routine Transfer to another Facility (non-emergent condition) '[]'$  - 0 Routine Hospital Admission (non-emergent condition) '[]'$  - 0 New Admissions / Biomedical engineer / Ordering NPWT Apligraf, etc. , '[]'$  - 0 Emergency Hospital Admission (emergent condition) PROCESS - Special Needs '[]'$  - 0 Pediatric / Minor Patient Management '[]'$  - 0 Isolation Patient Management '[]'$  - 0 Hearing / Language / Visual special needs '[]'$  - 0 Assessment of Community assistance (transportation, D/C planning, etc.) '[]'$  - 0 Additional assistance / Altered mentation '[]'$  - 0 Support Surface(s) Assessment (bed, cushion, seat, etc.) INTERVENTIONS - Miscellaneous '[]'$  - 0 External ear exam '[]'$  - 0 Patient Transfer (multiple staff / Civil Service fast streamer / Similar devices) '[]'$  - 0 Simple Staple / Suture  removal (25 or less) '[]'$  - 0 Complex Staple / Suture removal (26 or more) '[]'$  - 0 Hypo/Hyperglycemic Management (do not check  if billed separately) '[]'$  - 0 Ankle / Brachial Index (ABI) - do not check if billed separately Has the patient been seen at the hospital within the last three years: Yes Total Score: 0 Level Of Care: ____ Electronic Signature(s) Signed: 01/10/2023 4:51:29 PM By: [name] Entered By: [name] on 01/10/2023 10:51:40 -------------------------------------------------------------------------------- Compression Therapy Details Patient Name: Date of Service: Laura Jewett [name] 01/10/2023 10:00 A M Medical Record Number: [id number] Patient Account Number: [id number] Date of Birth/Sex: Treating RN: 30-Oct-1946 (77 y.o. Laura Santiago Primary Care Jamahl Lemmons: [place] Other Clinician: Adrine, Hayworth ([id number]) [id number] Page 3 of 11 Referring Dennette Faulconer: Treating Jamario Colina/Extender: [name] Weeks in Treatment: 79 Compression Therapy Performed for Wound Assessment: Wound #1 Left,Medial Lower Leg Performed By: [name], [name], Compression Type: Four Layer Pre Treatment ABI: 1.5 Post Procedure Diagnosis Same as Pre-procedure Electronic Signature(s) Signed: 01/10/2023 4:51:29 PM By: [name] Entered By: [name] on 01/10/2023 10:49:30 -------------------------------------------------------------------------------- Encounter Discharge Information Details Patient Name: Date of Service: Laura Jewett [name] 01/10/2023 10:00 [place] Record Number: [id number] Patient Account Number: [id number] Date of Birth/Sex: Treating RN: [birth year] (77 y.o. Laura Santiago Primary Care Chung Chagoya: [place] Other Clinician: [name] Referring Reiko Vinje: Treating Arieal Cuoco/Extender: [name] Weeks in Treatment: 28 Encounter  Discharge Information Items Post Procedure Vitals Discharge Condition: Stable Temperature (F): 98.2 Ambulatory Status: Walker Pulse (bpm): 80 Discharge Destination: Home Respiratory Rate (breaths/min): 16 Transportation: Other Blood Pressure (mmHg): 157/75 Accompanied By: self Schedule Follow-up Appointment: Yes Clinical Summary of Care: Electronic Signature(s) Signed: 01/10/2023 4:51:29 PM By: [name] Entered By: [name] on 01/10/2023 12:20:14 -------------------------------------------------------------------------------- Lower Extremity Assessment Details Patient Name: Date of Service: Laura Jewett [name] 01/10/2023 10:00 A M Medical Record Number: [id number] Patient Account Number: [id number] Date of Birth/Sex: Treating RN: Apr 10, 1946 (77 y.o. Laura Santiago Primary Care Ezeriah Luty: [place] Other Clinician: [name] Referring Verdean Murin: Treating Byran Bilotti/Extender: Kalman Shan [facility] Weeks in Treatment: [address] [place] M ([id number]) [id number] Page 4 of 11 Edema Assessment Assessed: [Left: Yes] [Right: Yes] Edema: [Left: Yes] [Right: Yes] Calf Left: Right: Point of Measurement: 35 cm From Medial Instep 33.5 cm 39.9 cm Ankle Left: Right: Point of Measurement: 12 cm From Medial Instep 21.6 cm 23.2 cm Vascular Assessment Pulses: Dorsalis Pedis Palpable: [Left:Yes] [Right:Yes] Electronic Signature(s) Signed: 01/10/2023 4:51:29 PM By: [name] Signed: 01/10/2023 7:35:08 PM By: Gretta Cool, BSN, RN, CWS, [name] RN, BSN Entered By: [name] on 01/10/2023 10:37:25 -------------------------------------------------------------------------------- Multi Wound Chart Details Patient Name: Date of Service: Laura Jewett [name] 01/10/2023 10:00 A M Medical Record Number: [id number] Patient Account Number: [id number] Date of Birth/Sex: Treating RN: [birth year] (77 y.o. Laura Santiago Primary Care Sameera Betton:  [place] Other Clinician: [name] Referring Odessa Morren: Treating Jakeb Lamping/Extender: [name] Weeks in Treatment: 19 Vital Signs Height(in): 66 Pulse(bpm): 80 Weight(lbs): 153 Blood Pressure(mmHg): 157/75 Body Mass Index(BMI): 24.7 Temperature(F): 98.2 Respiratory Rate(breaths/min): 18 [1:Photos:] [N/A:N/A] Left, Medial Lower Leg Left, Lateral Lower Leg N/A Wound Location: Gradually Appeared Pressure Injury N/A Wounding Event: Venous Leg Ulcer Venous Leg Ulcer N/A Primary Etiology: Hypertension, Osteoarthritis, ReceivedHypertension, Osteoarthritis, ReceivedN/A Comorbid History: Chemotherapy, Received Radiation Chemotherapy, Received Radiation 04/06/2021 10/18/2022 N/A Date Acquired: 19 12 N/A Weeks of Treatment: Open Open N/A Wound Status: DELANO, SCARDINO ([id number]) [id number] Page 5 of 11 No No N/A Wound  Recurrence: Yes No N/A Clustered Wound: 0.1x0.1x0.1 0.4x1.5x0.1 N/A Measurements L x W x D (cm) 0.008 0.471 N/A A (cm) : rea 0.001 0.047 N/A Volume (cm) : 100.00% 88.70% N/A % Reduction in Area: 100.00% 94.30% N/A % Reduction in Volume: Full Thickness Without Exposed Full Thickness Without Exposed N/A Classification: Support Structures Support Structures Medium Medium N/A Exudate Amount: Serosanguineous Serosanguineous N/A Exudate Type: red, brown red, brown N/A Exudate Color: Flat and Intact N/A N/A Wound Margin: Medium (34-66%) Medium (34-66%) N/A Granulation Amount: Red, Pink Pink N/A Granulation Quality: Medium (34-66%) Medium (34-66%) N/A Necrotic Amount: Fat Layer (Subcutaneous Tissue): Yes Fat Layer (Subcutaneous Tissue): Yes N/A Exposed Structures: Fascia: No Fascia: No Tendon: No Tendon: No Muscle: No Muscle: No Joint: No Joint: No Bone: No Bone: No Small (1-33%) None N/A Epithelialization: Treatment Notes Electronic Signature(s) Signed: 01/10/2023  4:51:29 PM By: [name] Entered By: [name] on 01/10/2023 10:37:29 -------------------------------------------------------------------------------- Multi-Disciplinary Care Plan Details Patient Name: Date of Service: Laura Jewett [name] 01/10/2023 10:00 A M Medical Record Number: [id number] Patient Account Number: [id number] Date of Birth/Sex: Treating RN: [birth year] (77 y.o. Laura Santiago Primary Care Ifeoluwa Beller: [place] Other Clinician: [name] Referring Malorie Bigford: Treating Jovanni Eckhart/Extender: [name] Weeks in Treatment: 50 Active Inactive Necrotic Tissue Nursing Diagnoses: Impaired tissue integrity related to necrotic/devitalized tissue Knowledge deficit related to management of necrotic/devitalized tissue Goals: Necrotic/devitalized tissue will be minimized in the wound bed Date Initiated: 07/06/2021 Target Resolution Date: 12/13/2022 Goal Status: Active Patient/caregiver will verbalize understanding of reason and process for debridement of necrotic tissue Date Initiated: 07/06/2021 Date Inactivated: 10/05/2021 Target Resolution Date: 07/06/2021 Goal Status: Met Interventions: Assess patient pain level pre-, during and post procedure and prior to discharge Provide education on necrotic tissue and debridement process Treatment Activities: Apply topical anesthetic as ordered : 07/06/2021 [name] ([id number]) [id number] Page 6 of 11 Biologic debridement : 07/06/2021 Enzymatic debridement : 07/06/2021 Excisional debridement : 07/06/2021 Notes: Soft Tissue Infection Nursing Diagnoses: Impaired tissue integrity Potential for infection: soft tissue Goals: Patient's soft tissue infection will resolve Date Initiated: 03/15/2022 Target Resolution Date: 12/13/2022 Goal Status: Active Signs and symptoms of infection will be recognized early to allow for prompt treatment Date Initiated:  03/15/2022 Target Resolution Date: 12/13/2022 Goal Status: Active Interventions: Assess signs and symptoms of infection every visit Treatment Activities: Culture and sensitivity : 03/15/2022 Notes: Electronic Signature(s) Signed: 01/10/2023 4:51:29 PM By: [name] Signed: 01/10/2023 7:35:08 PM By: Gretta Cool, BSN, RN, CWS, [name] RN, BSN Entered By: [name] on 01/10/2023 12:19:28 -------------------------------------------------------------------------------- Pain Assessment Details Patient Name: Date of Service: Laura Jewett [name] 01/10/2023 10:00 A M Medical Record Number: [id number] Patient Account Number: [id number] Date of Birth/Sex: Treating RN: [birth year] (77 y.o. Laura Santiago Primary Care Eythan Jayne: [place] Other Clinician: [name] Referring Illa Enlow: Treating Madinah Quarry/Extender: Kalman Shan [facility] [place] in Treatment: 27 Active Problems Location of Pain Severity and Description of Pain Patient Has Paino No Site Locations ABEERA, FLANNERY Stout ([id number]) [id number] Page 7 of 11 Pain Management and Medication Current Pain Management: Electronic Signature(s) Signed: 01/10/2023 4:51:29 PM By: [name] Signed: 01/10/2023 7:35:08 PM By: Gretta Cool, BSN, RN, CWS, [name] RN, BSN Entered By: [name] on 01/10/2023 10:15:33 -------------------------------------------------------------------------------- Patient/Caregiver Education Details Patient Name: Date of Service: Laura Jewett [name] 1/31/2024andnbsp10:00 [place] Record Number: [id number] Patient Account Number: [id number] Date of Birth/Gender: Treating RN: 1946-08-23 (77 y.o. Laura Santiago Primary  Care Physician: [place] Other Clinician: [name] Referring Physician: Treating Physician/Extender: [name] [place] Weeks in Treatment: 55 Education Assessment Education Provided To: Patient Education Topics  Provided Wound/Skin Impairment: Handouts: Other: continue wound care as directed Methods: Explain/Verbal Responses: State content correctly Electronic Signature(s) Signed: 01/10/2023 4:51:29 PM By: [name] Entered By: [name] on 01/10/2023 12:19:22 [name] ([id number]) [id number] Page 8 of 11 -------------------------------------------------------------------------------- Wound Assessment Details Patient Name: Date of Service: Laura Santiago. 01/10/2023 10:00 [place] Record Number: [id number] Patient Account Number: [id number] Date of Birth/Sex: Treating RN: 02/06/1946 (77 y.o. Laura Santiago Primary Care Xela Oregel: [place] Other Clinician: [name] Referring Jenissa Tyrell: Treating Xariah Silvernail/Extender: [name] Weeks in Treatment: 49 Wound Status Wound Number: 1 Primary Venous Leg Ulcer Etiology: Wound Location: Left, Medial Lower Leg Wound Status: Open Wounding Event: Gradually Appeared Comorbid Hypertension, Osteoarthritis, Received Chemotherapy, Date Acquired: 04/06/2021 History: Received Radiation Weeks Of Treatment: 79 Clustered Wound: Yes Photos Wound Measurements Length: (cm) 0.1 Width: (cm) 0.1 Depth: (cm) 0.1 Area: (cm) 0.008 Volume: (cm) 0.001 % Reduction in Area: 100% % Reduction in Volume: 100% Epithelialization: Small (1-33%) Wound Description Classification: Full Thickness Without Exposed Support Structures Wound Margin: Flat and Intact Exudate Amount: Medium Exudate Type: Serosanguineous Exudate Color: red, brown Foul Odor After Cleansing: No Slough/Fibrino Yes Wound Bed Granulation Amount: Medium (34-66%) Exposed Structure Granulation Quality: Red, Pink Fascia Exposed: No Necrotic Amount: Medium (34-66%) Fat Layer (Subcutaneous Tissue) Exposed: Yes Necrotic Quality: Adherent Slough Tendon Exposed: No Muscle Exposed: No Joint Exposed: No Bone  Exposed: No Treatment Notes Wound #1 (Lower Leg) Wound Laterality: Left, Medial Cleanser Wound Cleanser Discharge Instruction: Wash your hands with soap and water. Remove old dressing, discard into plastic bag and place into trash. Cleanse the wound with Wound Cleanser prior to applying a clean dressing using gauze sponges, not tissues or cotton balls. Do not scrub or use excessive MANALI, MCELMURRY ([id number]) [id number] Page 9 of 11 force. Pat dry using gauze sponges, not tissue or cotton balls. Peri-Wound Care Topical Triamcinolone Acetonide Cream, 0.1%, 15 (g) tube Discharge Instruction: lower leg Primary Dressing AquacelAg Advantage Dressing, 2X2 (in/in) Discharge Instruction: Apply to wound as directed Secondary Dressing Zetuvit Plus 4x8 (in/in) Secured With Compression Wrap Medichoice 4 layer Compression System, 35-40 mmHG Discharge Instruction: Apply multi-layer wrap as directed. Compression Stockings Add-Ons Electronic Signature(s) Signed: 01/10/2023 4:51:29 PM By: [name] Signed: 01/10/2023 7:35:08 PM By: Gretta Cool, BSN, RN, CWS, [name] RN, BSN Entered By: [name] on 01/10/2023 10:34:36 -------------------------------------------------------------------------------- Wound Assessment Details Patient Name: Date of Service: Laura Jewett [name] 01/10/2023 10:00 A M Medical Record Number: [id number] Patient Account Number: [id number] Date of Birth/Sex: Treating RN: 04/13/1946 (77 y.o. Laura Santiago Primary Care Tanetta Fuhriman: [place] Other Clinician: [name] Referring Kerrigan Gombos: Treating Jessicca Stitzer/Extender: [name] Weeks in Treatment: 73 Wound Status Wound Number: 8 Primary Venous Leg Ulcer Etiology: Wound Location: Left, Lateral Lower Leg Wound Status: Open Wounding Event: Pressure Injury Comorbid Hypertension, Osteoarthritis, Received Chemotherapy, Date Acquired: 10/18/2022 History:  Received Radiation Weeks Of Treatment: 12 Clustered Wound: No Photos Wound Measurements KAYLINE, SHEER M ([id number]) Length: (cm) 0.4 Width: (cm) 1.5 Depth: (cm) 0.1 Area: (cm) 0.471 Volume: (cm) 0.047 [id number] Page 10 of 11 % Reduction in Area: 88.7% % Reduction in Volume: 94.3% Epithelialization: None Wound Description Classification: Full Thickness Without Exposed Support Structures Exudate Amount: Medium Exudate Type:  Serosanguineous Exudate Color: red, brown Foul Odor After Cleansing: No Slough/Fibrino Yes Wound Bed Granulation Amount: Medium (34-66%) Exposed Structure Granulation Quality: Pink Fascia Exposed: No Necrotic Amount: Medium (34-66%) Fat Layer (Subcutaneous Tissue) Exposed: Yes Necrotic Quality: Adherent Slough Tendon Exposed: No Muscle Exposed: No Joint Exposed: No Bone Exposed: No Treatment Notes Wound #8 (Lower Leg) Wound Laterality: Left, Lateral Cleanser Wound Cleanser Discharge Instruction: Wash your hands with soap and water. Remove old dressing, discard into plastic bag and place into trash. Cleanse the wound with Wound Cleanser prior to applying a clean dressing using gauze sponges, not tissues or cotton balls. Do not scrub or use excessive force. Pat dry using gauze sponges, not tissue or cotton balls. Peri-Wound Care Topical Triamcinolone Acetonide Cream, 0.1%, 15 (g) tube Discharge Instruction: Mix 1:1 with Nystatin cream and apply to periwound and leg Primary Dressing AquacelAg Advantage Dressing, 2X2 (in/in) Discharge Instruction: Apply to wound as directed Secondary Dressing Zetuvit Plus 4x8 (in/in) Secured With Compression Wrap Medichoice 4 layer Compression System, 35-40 mmHG Discharge Instruction: Apply multi-layer wrap as directed. Compression Stockings Add-Ons Electronic Signature(s) Signed: 01/10/2023 4:51:29 PM By: [name] Signed: 01/10/2023 7:35:08 PM By: Gretta Cool, BSN, RN, CWS, [name] RN,  BSN Entered By: [name] on 01/10/2023 10:35:23 -------------------------------------------------------------------------------- Vitals Details Patient Name: Date of Service: [name], [place]. 01/10/2023 10:00 A M Medical Record Number: [id number] Patient Account Number: [id number] EILENE, VOIGT ([id number]) [id number] Page 11 of 11 Date of Birth/Sex: Treating RN: 01/28/1946 (77 y.o. Laura Santiago Primary Care Ahliya Glatt: Other Clinician: [place] Referring Dominque Levandowski: Treating Deklyn Gibbon/Extender: [name] Weeks in Treatment: 40 Vital Signs Time Taken: 10:12 Temperature (F): 98.2 Height (in): 66 Pulse (bpm): 80 Weight (lbs): 153 Respiratory Rate (breaths/min): 18 Body Mass Index (BMI): 24.7 Blood Pressure (mmHg): 157/75 Reference Range: 80 - 120 mg / dl Electronic Signature(s) Signed: 01/10/2023 4:51:29 PM By: [name] Entered By: [name] on 01/10/2023 10:15:27

## 2023-01-14 ENCOUNTER — Other Ambulatory Visit: Payer: Self-pay | Admitting: Oncology

## 2023-01-15 MED ORDER — AMPHETAMINE-DEXTROAMPHET ER 30 MG PO CP24: 30.0000 mg | ORAL_CAPSULE | Freq: Every day | ORAL | 0 refills | Status: DC

## 2023-01-15 NOTE — Telephone Encounter (Signed)
Patient should slowly wean herself off adderall as recommended by cardiology

## 2023-01-16 ENCOUNTER — Encounter (HOSPITAL_BASED_OUTPATIENT_CLINIC_OR_DEPARTMENT_OTHER): Payer: Self-pay | Admitting: Cardiology

## 2023-01-16 ENCOUNTER — Telehealth (INDEPENDENT_AMBULATORY_CARE_PROVIDER_SITE_OTHER): Payer: Medicare Other | Admitting: Cardiology

## 2023-01-16 ENCOUNTER — Telehealth (HOSPITAL_BASED_OUTPATIENT_CLINIC_OR_DEPARTMENT_OTHER): Payer: Self-pay | Admitting: *Deleted

## 2023-01-16 VITALS — BP 124/76 | HR 72 | Ht 65.0 in | Wt 144.0 lb

## 2023-01-16 DIAGNOSIS — T451X5A Adverse effect of antineoplastic and immunosuppressive drugs, initial encounter: Secondary | ICD-10-CM

## 2023-01-16 DIAGNOSIS — Z79899 Other long term (current) drug therapy: Secondary | ICD-10-CM

## 2023-01-16 DIAGNOSIS — C50919 Malignant neoplasm of unspecified site of unspecified female breast: Secondary | ICD-10-CM | POA: Diagnosis not present

## 2023-01-16 DIAGNOSIS — Z9221 Personal history of antineoplastic chemotherapy: Secondary | ICD-10-CM

## 2023-01-16 DIAGNOSIS — I427 Cardiomyopathy due to drug and external agent: Secondary | ICD-10-CM | POA: Diagnosis not present

## 2023-01-16 MED ORDER — METOPROLOL SUCCINATE ER 25 MG PO TB24: 25.0000 mg | ORAL_TABLET | Freq: Every day | ORAL | 3 refills | Status: AC

## 2023-01-16 NOTE — Progress Notes (Signed)
Virtual Visit via Video Note   Because of Laura Santiago's co-morbid illnesses, she is at least at moderate risk for complications without adequate follow up.  This format is felt to be most appropriate for this patient at this time.  All issues noted in this document were discussed and addressed.  A limited physical exam was performed with this format.  Please refer to the patient's chart for her consent to telehealth for [facility].  Video Connection Lost Video connection was lost at >50% of the duration of this visit, at which time the remainder of the visit was completed via audio only.    The patient was identified using 2 identifiers.  Patient Location: Home Provider Location: Office/Clinic   History of Present Illness:    Laura Santiago is a 77 y.o. female with a hx of DVT, hypertension, hyperparathyroidism, GERD, CKD IV, anemia, arthritis, breast cancer, ADHD, who is seen for follow up today. I initially as a new consult at the request of [facility] for the evaluation and management of reduced ejection fraction.  She was seen by Dr. Janese Banks 12/05/2022. Echocardiogram  11/30/2022 showed LVEF 94-70%, grade 2 diastolic dysfunction, mild to moderate mitral valve regurgitation, mild aortic valve regurgitation, and aortic valve sclerosis/calcification. She was referred for a CT performed 12/25/22 which revealed aortic atherosclerosis, unchanged sclerotic osseous metastatic lesions throughout the included axial and proximal appendicular skeleton, unchanged 0.5 cm right lower lobe pulmonary nodule, and unchanged chronic left subclavian venous stenosis about a port catheter.   Cardiovascular risk factors: Prior clinical ASCVD:   Aortic atherosclerosis Comorbid conditions:  Hypertension. CKD IV. Tobacco use history:  Never. Family history: Her father had hypertension and CAD with blockages. Her mother died of pulmonary issues. Two of her brothers were prescribed  statins. Prior pertinent testing and/or incidental findings: Echocardiogram  11/30/2022 showed LVEF 96-28%, grade 2 diastolic dysfunction, mild to moderate mitral valve regurgitation, mild aortic valve regurgitation, and aortic valve sclerosis/calcification. Exercise level: Usually she would walk her dog 4 times a day, but not lately.  Today,  We discussed the options going forward. She tried coming off of the adderall, but she doesn't feel like herself and has extreme fatigue. She understands that this is a stimulant and is not ideal with a weak heart, but she also thinks her quality of life is poor without the adderall. We discussed trialing a low dose of a beta blocker despite being on the adderall, and she is willing to try this. She would be willing to try a lower dose of adderall if one exists, but she does not think she can function without any at all.   She is tolerating the Jardiance without any issues.   We discussed options for next steps. See below.  She denies any palpitations, chest pain, shortness of breath, or peripheral edema. No lightheadedness, headaches, syncope, orthopnea, or PND.    Past Medical History:  Diagnosis Date   ADHD (attention deficit hyperactivity disorder)    in [place], no date on md note   Anemia    IDA 11/26/2019, Anemia in stage 4 chronic kidney disease ([place]) 09/03/2020   Arthritis    osteoarthritis right knee 09/30/2014   Breast cancer ([place]) 10/05/2013   in [place] +, [place] -, Her 2 is 3+   Cancer related pain 03/28/2021   in [name], md notes spine mets   cervical compression fracture 03/18/2021   in [place]   DVT of lower extremity, bilateral ([place]) 03/30/2014  in [name]   Generalized muscle weakness 03/31/2016   in [name]   GERD (gastroesophageal reflux disease) 05/15/2013   per md in [name]   Hyperparathyroidism, secondary ([place]) 04/25/2018   in [name]   Hypertension 02/20/2016   info from MD in [place] loss 02/05/2015   in [name]   Metabolic acidosis  75/09/2584   in [place]   Metabolic syndrome 27/78/2423   in [name]   Osteopenia after menopause 03/29/2016   in [name]   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   in [name]   Stasis ulcer of left lower extremity ([place]) 03/29/2016   in [name]    Past Surgical History:  Procedure Laterality Date   CESAREAN SECTION     unknown   fibroid removed  N/A    in [place] - unknown date   IR FLUORO GUIDE CV LINE LEFT  07/27/2021   IR PORT REPAIR CENTRAL VENOUS ACCESS DEVICE Left    In [place] N/A 02/25/2014   in [name]   ovary removed      unknown   PORTA CATH INSERTION N/A 02/13/2022   Procedure: PORTA CATH INSERTION;  Surgeon: Algernon Huxley, MD;  Location: [facility];  Service: Cardiovascular;  Laterality: N/A;   [place] CATARACT EXTRACAP,INSERT LENS Bilateral  Bilateral 09/05/2012   in [place]     unknown    LUMPECTOMY Right 04/03/2014   in [place]    Current Medications: Current Outpatient Medications on File Prior to Visit  Medication Sig   acetaminophen (TYLENOL) 500 MG tablet Take 500 mg by mouth every 4 (four) hours as needed.   amphetamine-dextroamphetamine (ADDERALL XR) 30 MG 24 hr capsule Take 1 capsule (30 mg total) by mouth daily.   calcium citrate (CALCITRATE - DOSED IN MG ELEMENTAL CALCIUM) 950 (200 Ca) MG tablet Take 200 mg of elemental calcium by mouth daily.   clindamycin (CLEOCIN) 300 MG capsule Take 300 mg by mouth every 6 (six) hours.   DULoxetine (CYMBALTA) 30 MG capsule Take by mouth.   empagliflozin (JARDIANCE) 10 MG TABS tablet Take 1 tablet (10 mg total) by mouth daily before breakfast.   fentaNYL (DURAGESIC) 25 MCG/HR PLACE 1 PATCH ONTO THE SKIN EVERY THREE DAYS AS DIRECTED.   fluocinonide (LIDEX) 0.05 % external solution Apply once or twice daily to scalp as needed for itching.  Avoid applying to face, groin, and axilla.   gabapentin (NEURONTIN) 100 MG capsule TAKE 1 CAPSULE BY MOUTH 3 TIMES DAILY   hydrocortisone 2.5 % cream Apply 1 to 2 times  daily for itchy rash at face, scalp, ears, and forehead.   hydrOXYzine (ATARAX) 25 MG tablet Take by mouth.   ketoconazole (NIZORAL) 2 % shampoo apply every other day  massage into scalp and leave in for 5 minutes before rinsing out   lisinopril (ZESTRIL) 20 MG tablet Take 1 tablet (20 mg total) by mouth daily.   LORazepam (ATIVAN) 0.5 MG tablet Take 1 tablet (0.5 mg total) by mouth daily as needed for anxiety.   Multiple Vitamin (MULTI-VITAMIN) tablet Take 1 tablet by mouth daily.   naloxone (NARCAN) nasal spray 4 mg/0.1 mL SPRAY 1 SPRAY INTO ONE NOSTRIL AS DIRECTED FOR OPIOID OVERDOSE (TURN PERSON ON SIDE AFTER DOSE. IF NO RESPONSE IN 2-3 MINUTES OR PERSON RESPONDS BUT RELAPSES, REPEAT USING A NEW SPRAY DEVICE AND SPRAY INTO THE OTHER NOSTRIL. CALL 911 AFTER USE.) * EMERGENCY USE ONLY *   NON FORMULARY Apply  1 Dose topically daily. Pt mixes up piperacillim and tazobactam and puts it on the wounds of left leg   ondansetron (ZOFRAN) 8 MG tablet Take 1 tablet (8 mg total) by mouth as needed for nausea or vomiting.   Oxycodone HCl 10 MG TABS Take 1.5 tablets (15 mg total) by mouth every 6 (six) hours as needed.   rivaroxaban (XARELTO) 20 MG TABS tablet Take 1 tablet (20 mg total) by mouth daily with supper.   sodium hypochlorite (DAKIN'S 1/4 STRENGTH) 0.125 % SOLN Apply 1 application topically as directed. Moisten gauze with solution and wrap wound   Current Facility-Administered Medications on File Prior to Visit  Medication   heparin lock flush 100 UNIT/ML injection     Allergies:   Other, Technetium tc 63mmedronate, Corticosteroids, Sulfa antibiotics, and Celebrex [celecoxib]   Social History   Tobacco Use   Smoking status: Never   Smokeless tobacco: Never  Vaping Use   Vaping Use: Never used  Substance Use Topics   Alcohol use: Not Currently   Drug use: Not Currently    Family History: family history includes Cancer - Prostate in her brother; Diabetes in her father; Heart disease  in her father; Hypertension in her father; Pancreatic cancer in her mother; Skin cancer in her brother and father; Stroke in her father; Varicose Veins in her father.  ROS:   Please see the history of present illness. All other systems are reviewed and negative.     EKGs/Labs/Other Studies Reviewed:    The following studies were reviewed today:  CT Chest/Abdomen/Pelvis  12/25/2022: IMPRESSION: 1. Unchanged sclerotic osseous metastatic lesions throughout the included axial and proximal appendicular skeleton. 2. No significant change in enlarged bilateral inguinal lymph nodes. No new lymphadenopathy. 3. Unchanged 0.5 cm right lower lobe pulmonary nodule. No new nodules. 4. Unchanged chronic left subclavian venous stenosis about a port catheter. 5. Unchanged common bile duct dilatation without intrahepatic biliary ductal dilatation. 6. Large burden of stool and stool balls.   Aortic Atherosclerosis (ICD10-I70.0).  Echo  11/30/2022:  1. Left ventricular ejection fraction, by estimation, is 35 to 40%. The left ventricle has moderately decreased function. The left ventricle demonstrates global hypokinesis. Left ventricular diastolic parameters are consistent with Grade II diastolic  dysfunction (pseudonormalization). The average left ventricular global longitudinal strain is -13.6 %.   2. Right ventricular systolic function is normal. The right ventricular size is normal.   3. Left atrial size was mildly dilated.   4. The mitral valve is normal in structure. Mild to moderate mitral valve regurgitation. No evidence of mitral stenosis.   5. The aortic valve is normal in structure. Aortic valve regurgitation is mild. Aortic valve sclerosis/calcification is present, without any evidence of aortic stenosis.   6. The inferior vena cava is normal in size with greater than 50%  respiratory variability, suggesting right atrial pressure of 3 mmHg.   EKG:  EKG is personally reviewed.   01/16/23:  not ordered today 12/26/2022: NSR at 74 bpm with nonspecific t wave abnormality  Recent Labs: 03/09/2022: TSH 3.604 11/14/2022: ALT 13; BUN 27; Hemoglobin 10.1; Platelets 317; Potassium 4.1; Sodium 141 12/25/2022: Creatinine, Ser 1.40   Recent Lipid Panel No results found for: "CHOL", "TRIG", "HDL", "CHOLHDL", "VLDL", "LDLCALC", "LDLDIRECT"  Physical Exam:    VS:  BP 124/76   Pulse 72   Ht '5\' 5"'$  (1.651 m)   Wt 144 lb (65.3 kg)   BMI 23.96 kg/m     Wt Readings from Last  3 Encounters:  01/16/23 144 lb (65.3 kg)  01/03/23 149 lb (67.6 kg)  12/26/22 145 lb 9.6 oz (66 kg)    VITAL SIGNS:  reviewed GEN:  no acute distress EYES:  sclerae anicteric, EOMI - Extraocular Movements Intact RESPIRATORY:  normal respiratory effort, symmetric expansion CARDIOVASCULAR:  no visible JVD SKIN:  no rash, lesions or ulcers. MUSCULOSKELETAL:  no obvious deformities. NEURO:  alert and oriented x 3, no obvious focal deficit PSYCH:  normal affect   ASSESSMENT:    1. Primary malignant neoplasm of breast with metastasis ([place])   2. Chemotherapy induced cardiomyopathy ([place])   3. Status post administration of cardiotoxic chemotherapy    PLAN:    Metastatic breast cancer New cardiomyopathy, suspect 2/2 chemotherapy History of cardiotoxic chemotherapy -Suspect Herceptin is the etiology of her possible chemotherapy-induced cardiomyopathy.  She endorses receiving Herceptin both during her initial diagnosis 10 years ago and with her recent recurrence. -She has also had breast radiation therapy to in the past, but not recently.  Her drop in EF is recent -Very difficult situation.  She has chronic kidney disease that limits use of guideline directed medical therapy.  She is currently on an ACE inhibitor. She is tolerating Jardiance -we will add low dose metoprolol today -she trialed being off adderall, but she did not feel that this gave her a good quality of life. She understands the risks but would like to  go back on it. She would be willing to trial a lower dose if that is possible.  -we will recheck an echo in a month. We discussed possibilities based on these results. She would very much like to be on chemotherapy. -We also discussed at length that it is unclear whether her ejection fraction will recover.  Has not recovered with cessation of chemotherapy alone. -If EF improved on meds, would consider restarting chemo (per Dr. Janese Banks) with close monitoring. If EF stable, will need to have discussion between her and Dr. Janese Banks re: risk of worsening function with restarting chemotherapy. If EF further reduced, she is high risk of worsening cardiomyopathy if chemo restarted.   It is difficult for her to make it to in person medical visits as she lives alone, so she would prefer to do as much follow-up as possible by virtual visits.  Today, I have spent 31 minutes with the patient with telehealth technology discussing the above problems.  Additional time spent in chart review, documentation, and communication.   Plan for follow up: echo in 1 month with follow up visit after echo  Buford Dresser, MD, PhD, [place]    Today, I have spent 31 minutes with the patient with telehealth technology discussing the above problems.  Additional time spent in chart review, documentation, and communication.   Medication Adjustments/Labs and Tests Ordered: Current medicines are reviewed at length with the patient today.  Concerns regarding medicines are outlined above.   Orders Placed This Encounter  Procedures   ECHOCARDIOGRAM COMPLETE   Meds ordered this encounter  Medications   metoprolol succinate (TOPROL-XL) 25 MG 24 hr tablet    Sig: Take 1 tablet (25 mg total) by mouth daily. Take with or immediately following a meal.    Dispense:  90 tablet    Refill:  3   Patient Instructions   Medication Instructions:  START METOPROLOL SUCC 25 MG DAILY    Labwork: NONE  Testing/Procedures: Your physician has requested that you have an echocardiogram. Echocardiography is a painless test  that uses sound waves to create images of your heart. It provides your doctor with information about the size and shape of your heart and how well your heart's chambers and valves are working. This procedure takes approximately one hour. There are no restrictions for this procedure. Please do NOT wear cologne, perfume, aftershave, or lotions (deodorant is allowed). Please arrive 15 minutes prior to your appointment time.  IN 1 MONTH   Follow-Up: AFTER ECHO IN 1 MONTH       Signed, Buford Dresser, MD PhD 01/16/2023 6:12 PM    [facility]

## 2023-01-16 NOTE — Telephone Encounter (Signed)
Patient had virtual visit with Dr Harrell Gave today  Needs Echo in 1 month and follow up week after   Left message to call back

## 2023-01-16 NOTE — Patient Instructions (Signed)
Medication Instructions:  START METOPROLOL SUCC 25 MG DAILY   Labwork: NONE  Testing/Procedures: Your physician has requested that you have an echocardiogram. Echocardiography is a painless test that uses sound waves to create images of your heart. It provides your doctor with information about the size and shape of your heart and how well your heart's chambers and valves are working. This procedure takes approximately one hour. There are no restrictions for this procedure. Please do NOT wear cologne, perfume, aftershave, or lotions (deodorant is allowed). Please arrive 15 minutes prior to your appointment time.  IN 1 MONTH   Follow-Up: AFTER ECHO IN 1 MONTH

## 2023-01-17 ENCOUNTER — Encounter: Payer: Medicare Other | Attending: Internal Medicine | Admitting: Internal Medicine

## 2023-01-17 DIAGNOSIS — N184 Chronic kidney disease, stage 4 (severe): Secondary | ICD-10-CM | POA: Diagnosis not present

## 2023-01-17 DIAGNOSIS — L03115 Cellulitis of right lower limb: Secondary | ICD-10-CM | POA: Diagnosis not present

## 2023-01-17 DIAGNOSIS — S81801A Unspecified open wound, right lower leg, initial encounter: Secondary | ICD-10-CM | POA: Diagnosis not present

## 2023-01-17 DIAGNOSIS — L97822 Non-pressure chronic ulcer of other part of left lower leg with fat layer exposed: Secondary | ICD-10-CM | POA: Insufficient documentation

## 2023-01-17 DIAGNOSIS — I129 Hypertensive chronic kidney disease with stage 1 through stage 4 chronic kidney disease, or unspecified chronic kidney disease: Secondary | ICD-10-CM | POA: Insufficient documentation

## 2023-01-17 DIAGNOSIS — Z86718 Personal history of other venous thrombosis and embolism: Secondary | ICD-10-CM | POA: Insufficient documentation

## 2023-01-17 DIAGNOSIS — I87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: Secondary | ICD-10-CM

## 2023-01-17 DIAGNOSIS — I87311 Chronic venous hypertension (idiopathic) with ulcer of right lower extremity: Secondary | ICD-10-CM | POA: Insufficient documentation

## 2023-01-17 DIAGNOSIS — Z7901 Long term (current) use of anticoagulants: Secondary | ICD-10-CM | POA: Diagnosis not present

## 2023-01-17 DIAGNOSIS — M1990 Unspecified osteoarthritis, unspecified site: Secondary | ICD-10-CM | POA: Insufficient documentation

## 2023-01-17 NOTE — Progress Notes (Signed)
Laura, Santiago ([id number]) [id number] Page 1 of 8 Visit Report for 01/17/2023 Arrival Information Details Patient Name: Date of Service: Laura Santiago. 01/17/2023 10:00 A M Medical Record Number: [id number] Patient Account Number: [id number] Date of Birth/Sex: Treating RN: 05/03/1946 (77 y.o. Laura Santiago Primary Care Nikayla Madaris: [place] Other Clinician: [name] Referring Sendy Pluta: Treating Advika Mclelland/Extender: [name] Weeks in Treatment: 41 Visit Information History Since Last Visit Added or deleted any medications: No Patient Arrived: Wheel Chair Has Dressing in Place as Prescribed: Yes Arrival Time: 10:26 Pain Present Now: No Accompanied By: dog,Jimma Transfer Assistance: None Patient Identification Verified: Yes Secondary Verification Process Completed: Yes Patient Requires Transmission-Based No Precautions: Patient Has Alerts: Yes Patient Alerts: PT HAS SERVICE ANIMAL ABI 07/11/21 R) 1.16 L) 1.27 Electronic Signature(s) Signed: 01/17/2023 2:30:14 PM By: Gretta Cool, BSN, RN, CWS, [name] RN, BSN Entered By: Gretta Cool, BSN, RN, CWS, [name] on 01/17/2023 10:26:39 -------------------------------------------------------------------------------- Lower Extremity Assessment Details Patient Name: Date of Service: Laura Jewett [name] 01/17/2023 10:00 A M Medical Record Number: [id number] Patient Account Number: [id number] Date of Birth/Sex: Treating RN: 1946-03-24 (77 y.o. Laura Santiago Primary Care Jaydi Bray: [place] Other Clinician: [name] Referring Emelee Rodocker: Treating Oaklie Durrett/Extender: [name] [place] in Treatment: 80 Edema Assessment Assessed: [Left: No] [Right: No] [Left: Edema] [Right: :] Calf Left: Right: Point of Measurement: 35 cm From Medial Instep 33.5 cm 39.9 cm Laura Santiago, Laura Santiago ([id number]) [phone].pdf Page 2 of  8 Ankle Left: Right: Point of Measurement: 12 cm From Medial Instep 21.6 cm 23.2 cm Vascular Assessment Pulses: Dorsalis Pedis Palpable: [Left:Yes] [Right:Yes] Electronic Signature(s) Signed: 01/17/2023 2:30:14 PM By: Gretta Cool, BSN, RN, CWS, [name] RN, BSN Entered By: Gretta Cool, BSN, RN, CWS, [name] on 01/17/2023 10:47:02 -------------------------------------------------------------------------------- Multi Wound Chart Details Patient Name: Date of Service: Laura Jewett [name] 01/17/2023 10:00 A M Medical Record Number: [id number] Patient Account Number: [id number] Date of Birth/Sex: Treating RN: 1945-12-15 (77 y.o. Laura Santiago Primary Care Ifeoma Vallin: [place] Other Clinician: [name] Referring Tylynn Braniff: Treating Jacobs Golab/Extender: [name] Weeks in Treatment: 60 Vital Signs Height(in): 66 Pulse(bpm): 68 Weight(lbs): 153 Blood Pressure(mmHg): 125/66 Body Mass Index(BMI): 24.7 Temperature(F): 98.3 Respiratory Rate(breaths/min): 18 [1:Photos:] Left, Medial Lower Leg Left, Lateral Lower Leg Right, Midline Lower Leg Wound Location: Gradually Appeared Pressure Injury Gradually Appeared Wounding Event: Venous Leg Ulcer Venous Leg Ulcer Venous Leg Ulcer Primary Etiology: N/A N/A Hypertension, Osteoarthritis, Received Comorbid History: Chemotherapy, Received Radiation 04/06/2021 10/18/2022 01/17/2023 Date Acquired: 48 13 0 Weeks of Treatment: Open Open Open Wound Status: No No No Wound Recurrence: Yes No Yes Clustered Wound: N/A N/A 3 Clustered Quantity: 0.1x0.1x0.1 0.3x0.6x0.1 3.5x1.2x0.1 Measurements L x W x D (cm) 0.008 0.141 3.299 A (cm) : rea 0.001 0.014 0.33 Volume (cm) : 100.00% 96.60% N/A % Reduction in Area: 100.00% 98.30% N/A % Reduction in Volume: Full Thickness Without Exposed Full Thickness Without Exposed Partial Thickness Classification: Support Structures Support Structures Medium Medium Medium Exudate  Amount: Serosanguineous Serosanguineous Serous Exudate TypeELISIA, Santiago ([id number]) [id number] Page 3 of 8 red, brown red, brown amber Exudate Color: N/A N/A None Present (0%) Granulation A mount: N/A N/A Large (67-100%) Necrotic A mount: N/A N/A Eschar Necrotic Tissue: N/A N/A None Epithelialization: Treatment Notes Electronic Signature(s) Signed: 01/17/2023 2:30:14 PM By: Gretta Cool, BSN, RN, CWS, [name] RN, BSN Entered By: Gretta Cool, BSN, RN, CWS, [name] on 01/17/2023 10:54:58 -------------------------------------------------------------------------------- Pain Assessment  Details Patient Name: Date of Service: Laura Santiago. 01/17/2023 10:00 A M Medical Record Number: [id number] Patient Account Number: [id number] Date of Birth/Sex: Treating RN: Mar 26, 1946 (77 y.o. Laura Santiago Primary Care Misha Antonini: [place] Other Clinician: [name] Referring Emmalene Kattner: Treating Hazael Olveda/Extender: [name] Weeks in Treatment: 67 Active Problems Location of Pain Severity and Description of Pain Patient Has Paino Yes Site Locations Pain Location: Generalized Pain, Pain in Ulcers Rate the pain. Current Pain Level: 4 Pain Management and Medication Current Pain Management: Medication: Yes Notes Patient states pain is all over in different areas, like shoulders, leg, etc. Electronic Signature(s) Signed: 01/17/2023 2:30:14 PM By: Gretta Cool, BSN, RN, CWS, [name] RN, BSN Entered By: Gretta Cool, BSN, RN, CWS, [name] on 01/17/2023 10:28:42 Laura Santiago ([id number]) [id number].pdf Page 4 of 8 -------------------------------------------------------------------------------- Patient/Caregiver Education Details Patient Name: Date of Service: Laura Santiago, Laura [name] 2/7/2024andnbsp10:00 A M Medical Record Number: [id number] Patient Account Number: [id number] Date of Birth/Gender: Treating RN: 05/03/1946 (77 y.o. Laura Santiago Primary Care Physician: [place] Other Clinician: [name] Referring Physician: Treating Physician/Extender: [name] Weeks in Treatment: 38 Education Assessment Education Provided To: Patient Education Topics Provided Venous: Handouts: Controlling Swelling with Multilayered Compression Wraps Methods: Demonstration, Explain/Verbal Responses: State content correctly Electronic Signature(s) Signed: 01/17/2023 2:30:14 PM By: Gretta Cool, BSN, RN, CWS, [name] RN, BSN Entered By: Gretta Cool, BSN, RN, CWS, [name] on 01/17/2023 11:22:54 -------------------------------------------------------------------------------- Wound Assessment Details Patient Name: Date of Service: Laura Jewett [name] 01/17/2023 10:00 A M Medical Record Number: [id number] Patient Account Number: [id number] Date of Birth/Sex: Treating RN: 05/17/1946 (77 y.o. Laura Santiago Primary Care Mahonri Seiden: [place] Other Clinician: [name] Referring Khalel Alms: Treating Syreeta Figler/Extender: [name] Weeks in Treatment: 22 Wound Status Wound Number: 1 Primary Etiology: Venous Leg Ulcer Wound Location: Left, Medial Lower Leg Wound Status: Open Wounding Event: Gradually Appeared Date Acquired: 04/06/2021 Weeks Of Treatment: 80 Clustered Wound: Yes Photos Laura Santiago, Laura Santiago ([id number]) [id number].pdf Page 5 of 8 Photo Uploaded By: Gretta Cool, BSN, RN, CWS, [name] on 01/17/2023 10:44:34 Wound Measurements Length: (cm) 0.1 Width: (cm) 0.1 Depth: (cm) 0.1 Area: (cm) 0.008 Volume: (cm) 0.001 % Reduction in Area: 100% % Reduction in Volume: 100% Wound Description Classification: Full Thickness Without Exposed Support Exudate Amount: Medium Exudate Type: Serosanguineous Exudate Color: red, brown Structures Electronic Signature(s) Signed: 01/17/2023 2:30:14 PM By: Gretta Cool, BSN, RN, CWS, [name] RN, BSN Entered By: Gretta Cool, BSN,  RN, CWS, [name] on 01/17/2023 10:38:33 -------------------------------------------------------------------------------- Wound Assessment Details Patient Name: Date of Service: Laura Jewett [name] 01/17/2023 10:00 A M Medical Record Number: [id number] Patient Account Number: [id number] Date of Birth/Sex: Treating RN: 09/25/1946 (77 y.o. Laura Santiago Primary Care Arie Powell: [place] Other Clinician: [name] Referring Baran Kuhrt: Treating Yukie Bergeron/Extender: Kalman Shan [facility] Weeks in Treatment: 83 Wound Status Wound Number: 8 Primary Etiology: Venous Leg Ulcer Wound Location: Left, Lateral Lower Leg Wound Status: Open Wounding Event: Pressure Injury Date Acquired: 10/18/2022 Weeks Of Treatment: 13 Clustered Wound: No Photos Laura Santiago, Laura Santiago ([id number]) [phone].pdf Page 6 of 8 Photo Uploaded By: Gretta Cool, BSN, RN, CWS, [name] on 01/17/2023 10:44:35 Wound Measurements Length: (cm) 0.3 Width: (cm) 0.6 Depth: (cm) 0.1 Area: (cm) 0.141 Volume: (cm) 0.014 % Reduction in Area: 96.6% % Reduction in Volume: 98.3% Wound Description Classification: Full Thickness Without Exposed Support Exudate Amount: Medium Exudate Type: Serosanguineous Exudate  Color: red, brown Structures Electronic Signature(s) Signed: 01/17/2023 2:30:14 PM By: Gretta Cool, BSN, RN, CWS, [name] RN, BSN Entered By: Gretta Cool, BSN, RN, CWS, [name] on 01/17/2023 10:38:33 -------------------------------------------------------------------------------- Wound Assessment Details Patient Name: Date of Service: Laura Jewett [name] 01/17/2023 10:00 A M Medical Record Number: [id number] Patient Account Number: [id number] Date of Birth/Sex: Treating RN: [birth year] (77 y.o. Laura Santiago Primary Care Maytal Mijangos: [place] Other Clinician: [name] Referring Meha Vidrine: Treating Pranish Akhavan/Extender: [name] Weeks in Treatment: 64 Wound  Status Wound Number: 9 Primary Venous Leg Ulcer Etiology: Wound Location: Right, Midline Lower Leg Wound Status: Open Wounding Event: Gradually Appeared Comorbid Hypertension, Osteoarthritis, Received Chemotherapy, Date Acquired: 01/17/2023 History: Received Radiation Weeks Of Treatment: 0 Clustered Wound: Yes Photos Laura Santiago, Laura Santiago ([id number]) [phone].pdf Page 7 of 8 Wound Measurements Length: (cm) 3.5 Width: (cm) 1.2 Depth: (cm) 0.1 Clustered Quantity: 3 Area: (cm) 3.299 Volume: (cm) 0.33 % Reduction in Area: % Reduction in Volume: Epithelialization: None Tunneling: No Undermining: No Wound Description Classification: Partial Thickness Exudate Amount: Medium Exudate Type: Serous Exudate Color: amber Foul Odor After Cleansing: No Slough/Fibrino Yes Wound Bed Granulation Amount: None Present (0%) Exposed Structure Necrotic Amount: Large (67-100%) Fascia Exposed: No Necrotic Quality: Eschar Fat Layer (Subcutaneous Tissue) Exposed: Yes Tendon Exposed: No Muscle Exposed: No Joint Exposed: No Bone Exposed: No Electronic Signature(s) Signed: 01/17/2023 2:30:14 PM By: Gretta Cool, BSN, RN, CWS, [name] RN, BSN Entered By: Gretta Cool, BSN, RN, CWS, [name] on 01/17/2023 10:43:58 -------------------------------------------------------------------------------- Vitals Details Patient Name: Date of Service: Laura Santiago, Laura Santiago [name] 01/17/2023 10:00 A M Medical Record Number: [id number] Patient Account Number: [id number] Date of Birth/Sex: Treating RN: Oct 18, 1946 (76 y.o. Laura Santiago Primary Care Marzelle Rutten: [place] Other Clinician: [name] Referring Sherrye Puga: Treating Letetia Romanello/Extender: [name] Weeks in Treatment: 50 Vital Signs Time Taken: 10:26 Temperature (F): 98.3 Height (in): 66 Pulse (bpm): 68 Weight (lbs): 153 Respiratory Rate (breaths/min): 18 Body Mass Index (BMI): 24.7 Blood Pressure (mmHg):  125/66 Reference Range: 80 - 120 mg / dl Electronic Signature(s) Signed: 01/17/2023 2:30:14 PM By: Gretta Cool, BSN, RN, CWS, [name] RN, BSN [address], [place] M ([id number]) By: Gretta Cool, BSN, RN, CWS, [name] RN, BSN [phone].pdf Page 8 of 8 Signed: 01/17/2023 2:30:14 PM Entered By: Gretta Cool, BSN, RN, CWS, [name] on 01/17/2023 10:27:08

## 2023-01-17 NOTE — Progress Notes (Signed)
ANAYS, DETORE ([id number]) [id number] Page 1 of 13 Visit Report for 01/17/2023 Chief Complaint Document Details Patient Name: Date of Service: Laura Ace. 01/17/2023 10:00 A Santiago Medical Record Number: [id number] Patient Account Number: [id number] Date of Birth/Sex: Treating RN: 1946/07/26 (77 y.o. Laura Santiago Primary Care Provider: [place] Other Clinician: [name] Referring Provider: Treating Provider/Extender: [name] Weeks in Treatment: 54 Information Obtained from: Patient Chief Complaint Left lower extremity wound Right toe wounds Left upper lateral thigh wounds Electronic Signature(s) Signed: 01/17/2023 11:08:56 AM By: Kalman Shan DO Entered By: Kalman Shan on 01/17/2023 11:02:39 -------------------------------------------------------------------------------- Debridement Details Patient Name: Date of Service: Laura Jewett NNIE Santiago. 01/17/2023 10:00 [place] Record Number: [id number] Patient Account Number: [id number] Date of Birth/Sex: Treating RN: 09/22/1946 (77 y.o. Laura Santiago Primary Care Provider: [place] Other Clinician: [name] Referring Provider: Treating Provider/Extender: [name] Weeks in Treatment: 67 Debridement Performed for Assessment: Wound #8 Left,Lateral Lower Leg Performed By: Physician Kalman Shan, MD Debridement Type: Debridement Severity of Tissue Pre Debridement: Fat layer exposed Level of Consciousness (Pre-procedure): Awake and Alert Pre-procedure Verification/Time Out Yes - 10:50 Taken: T Area Debrided (L x W): otal 0.3 (cm) x 0.6 (cm) = 0.18 (cm) Tissue and other material debrided: Non-Viable, Skin: Dermis , Skin: Epidermis Level: Skin/Epidermis Debridement Description: Selective/Open Wound Instrument: Curette Bleeding: None Response to Treatment: Procedure was tolerated well Level of  Consciousness (Post- Awake and Alert procedure): COLINE, CALKIN ([id number]) [id number] Page 2 of 13 Post Debridement Measurements of Total Wound Length: (cm) 0.3 Width: (cm) 0.6 Depth: (cm) 0.1 Volume: (cm) 0.014 Character of Wound/Ulcer Post Debridement: Stable Severity of Tissue Post Debridement: Fat layer exposed Post Procedure Diagnosis Same as Pre-procedure Electronic Signature(s) Signed: 01/17/2023 11:08:56 AM By: Kalman Shan DO Signed: 01/17/2023 2:30:14 PM By: Gretta Cool, BSN, RN, CWS, [name] RN, BSN Entered By: Gretta Cool, BSN, RN, CWS, [name] on 01/17/2023 10:56:46 -------------------------------------------------------------------------------- Debridement Details Patient Name: Date of Service: Laura Jewett NNIE Santiago. 01/17/2023 10:00 [place] Record Number: [id number] Patient Account Number: [id number] Date of Birth/Sex: Treating RN: 1946-10-29 (77 y.o. Laura Santiago Primary Care Provider: [place] Other Clinician: [name] Referring Provider: Treating Provider/Extender: [name] Weeks in Treatment: 15 Debridement Performed for Assessment: Wound #1 Left,Medial Lower Leg Performed By: Physician Kalman Shan, MD Debridement Type: Debridement Severity of Tissue Pre Debridement: Limited to breakdown of skin Level of Consciousness (Pre-procedure): Awake and Alert Pre-procedure Verification/Time Out Yes - 10:50 Taken: T Area Debrided (L x W): otal 0.2 (cm) x 0.2 (cm) = 0.04 (cm) Tissue and other material debrided: Non-Viable, Skin: Dermis , Skin: Epidermis Level: Skin/Epidermis Debridement Description: Selective/Open Wound Instrument: Curette Bleeding: None Response to Treatment: Procedure was tolerated well Level of Consciousness (Post- Awake and Alert procedure): Post Debridement Measurements of Total Wound Length: (cm) 0.2 Width: (cm) 0.2 Depth: (cm) 0.2 Volume: (cm) 0.006 Character of  Wound/Ulcer Post Debridement: Stable Severity of Tissue Post Debridement: Fat layer exposed Post Procedure Diagnosis Same as Pre-procedure Electronic Signature(s) Signed: 01/17/2023 11:08:56 AM By: Kalman Shan DO Signed: 01/17/2023 2:30:14 PM By: Gretta Cool, BSN, RN, CWS, [name] RN, BSN Entered By: Gretta Cool, BSN, RN, CWS, [name] on 01/17/2023 10:57:22 Laura Santiago ([id number]) [id number] Page 3 of 13 -------------------------------------------------------------------------------- HPI Details Patient Name: Date of Service: Laura Ace. 01/17/2023 10:00 [place] Record Number: [id number] Patient Account Number:  [id number] Date of Birth/Sex: Treating RN: 11-Nov-1946 (77 y.o. Laura Santiago Primary Care Provider: [place] Other Clinician: [name] Referring Provider: Treating Provider/Extender: Kalman Shan [facility] Weeks in Treatment: 61 History of Present Illness HPI Description: Admission 7/27 Ms. Aijalon Demuro is a 77 year old female with a past medical history of ADHD, metastatic breast cancer, stage IV chronic kidney disease, history of DVT on Xarelto and chronic venous insufficiency that presents to the clinic for a chronic left lower extremity wound. She recently moved to [facility] 4 days ago. She was being followed by wound care center in [name]. She reports a 10-year history of wounds to her left lower extremity that eventually do heal with debridement and compression therapy. She states that the current wound reopened 4 months ago and she is using Vaseline and Coban. She denies signs of infection. 8/3; patient presents for 1 week follow-up. She reports no issues or complaints today. She states she had vascular studies done in the last week. She denies signs of infection. She brought her little service dog with her today. 8/17; patient presents for follow-up. She has missed her last clinic appointment.  She states she took the wrap off and attempted to rewrap her leg. She is having difficulty with transportation. She has her service dog with her today. Overall she feels well and reports improvement in wound healing. She denies signs of infection. She reports owning an old Velcro wrap compression and has this at her living facility 9/14; patient presents for follow-up. Patient states that over the past 2 to 3 weeks she developed toe wounds to her right foot. She attributes this to tight fitting shoes. She subsequently developed cellulitis in the right leg and has been treated by doxycycline by her oncologist. She reports improvement in symptoms however continues to have some redness and swelling to this leg. T the left lower extremity patient has been having her wraps changed with home health twice weekly. She states that the [facility] is not helping control o the drainage. Other than that she has no issues or complaints today. She denies signs of infection to the left lower extremity. 9/21; patient presents for follow-up. She reports seeing infectious disease for her cellulitis. She reports no further management. She has home health that changes the wraps twice weekly. She has no issues or complaints today. She denies signs of infection. 10/5; patient presents for follow-up. She has no issues or complaints today. She denies signs of infection. She states that the right great toe has not been dressed by home health. 10/12; patient presents for follow-up. She has no issues or complaints today. She reports improvement in her wound healing. She has been using silver alginate to the right great toe wound. She denies signs of infection. 10/26; patient presents for follow-up. Home health did not have sorbact so they continued to use Hydrofera Blue under the wrap. She has been using silver alginate to the great toe wound however she did not have a dressing in place today. She currently denies signs of  infection. 11/2; patient presents for follow-up. She has been using sorb act under the compression wrap. She reports using silver alginate to the toe wound again she does not have a dressing in place. She currently denies signs of infection. 11/23; patient presents for follow-up. Unfortunately she has missed her last 2 clinic appointments. She was last seen 3 weeks ago. She did her own compression wrap with Kerlix and Coban yesterday  after seeing vein and vascular. She has not been dressing her right great toe wound. She currently denies signs of infection. 11/30; patient presents for 1 week follow-up. She states she changed her dressing last week prior to home health and use sorb act with Dakin's and Hydrofera Blue. Home health has changed the dressing as well and they have been using sorbact. T oday she reports increased redness to her right lower extremity. She has a history of cellulitis to this leg. She has been using silver alginate to the right great toe. Unfortunately she had an episode of diarrhea prior to coming in and had feces all over the right leg and to the wrap of her left leg. 12/7; patient presents for 1 week follow-up. She states that home health did not come out to change the dressing and she took it off yesterday. It is unclear if she is dressing the right toe wound. She denies signs of infection. 12/14; patient presents for 1 week follow-up. She has no issues or complaints today. 12/21; patient presents for follow-up. She has no issues or complaints today. She denies signs of infection. 12/28/2021; patient presents for follow-up. She was hospitalized for sepsis secondary to right lower extremity cellulitis On 12/23. She states she is currently at a SNF. She states that she was started on doxycycline this morning for her right great toe swelling and redness. She is not sure what dressings have been done to her left lower extremity for the past 3 weeks. She says its been mainly  gauze with an Ace wrap. 1/25; patient presents for follow-up. She is still residing in a skilled nursing facility. She reports mild pain to the left lower extremity wound bed. She states she is going to see a podiatrist soon. 2/8; patient presents for follow-up. She has moved back to her residential community from her skilled nursing facility. She has no issues or complaints today. She denies signs of systemic infections. 2/15; patient presents for follow-up. He has no issues or complaints today. She denies systemic signs of infection. Laura Santiago, Laura Santiago ([id number]) [id number] Page 4 of 13 2/22; patient presents for follow-up. She has no issues or complaints today. She denies signs of infection. 3/1; patient presents for follow-up. She states that home health came out the day after she was seen in our clinic and yesterday to do the wrap change. She denies signs of infection. She reports excoriated skin on the ankle. 3/8; patient presents for follow-up. She has no issues or complaints today. She denies signs of infection. 3/15; patient presents for follow-up. Home health has been coming out to change the dressings. She reports more tenderness to the wound site. She denies purulent drainage, increased warmth or erythema to the area. 4/5; patient presents for follow-up. She has missed her last 2 clinic appointments. I have not seen her in 3 weeks. She was recently hospitalized for altered mental status. She was involuntarily committed. She was evaluated by psychiatry and deemed to have competency. There was no specific cause of her altered mental status. It was concluded that her physical and mental health were declining due to her chronic medical conditions. Currently home health has been coming out for dressing changes. Patient has also been doing her own dressing changes. She reports more skin breakdown to the periwound and now has a new wound. She denies fever/chills.  She reports continued tenderness to the wound site. 4/12; patient with significant venous insufficiency and a large wound on her left lower leg taking  up about 80% of the circumference of her lower leg. Cultures of this grew MRSA and Pseudomonas. She had completed a course of ciprofloxacin now is starting doxycycline. She has been using Dakin's wet-to-dry and a Tubigrip. She has home health twice a week and we change it once. 4/19; patient presents for follow-up. She completed her course of doxycycline. She has been using Dakin's wet-to-dry dressing and Tubigrip. Home health changes the dressing twice weekly. Currently she has no issues or complaints. 4/26; patient presents for follow-up. At last clinic visit orders for home health were Iodosorb under compression therapy. Unfortunately they did not have the dressing and have been using Dakin's and gentamicin under the wrap. Patient currently denies signs of infection. She has no issues or complaints today. 5/3; patient presents for follow-up. Again Iodosorb has not been used under the compression therapy when home health comes out to change the wrap and dressing. They have been using Sorbact. It is unclear why this is happening since we send orders weekly to the agency. She denies signs of infection. Patient has not purchased the [place] antibiotics. We reached out to the company and they said they have been trying to contact her on a regular basis. We gave the patient the number to call to order the medication. 5/10; patient presents for follow-up. She has no issues or complaints today. Again home health has not been using Iodosorb. Mepilex was on the wound bed. No other dressings noted. She brought in her Keystone antibiotics. She denies signs of infection. 5/17; patient presents for follow-up. Home health has come out twice since she was last seen. Joint well she has been using Keystone antibiotic with Sorbact under the compression wrap. She has  no issues or complaints today. She denies signs of infection. 5/24; patient presents for follow-up. We have been using Keystone antibiotics with Sorbact under compression therapy. She is tolerating the treatment well. She is reporting improvement in wound healing. She denies signs of infection. 5/31; patient presents for follow-up. We continue to do [facility] antibiotics with Sorbact under compression therapy. She continues to report improvement in wound healing. Home health comes out and changes the dressing once weekly. 05-17-2022 upon evaluation today patient appears to be doing better in regard to her wound especially compared to the last time I saw her. Fortunately I do think that she is seeing improvements. With that being said I do believe that she may be benefit from sharp debridement today to clear away some of the necrotic debris I discussed that with her as well. She is an amendable to that plan. Otherwise she is very pleased with how the [employer] is doing for her. 6/14; patient presents for follow-up. We have been using Keystone antibiotic with Sorbact and absorbent dressings under 3 layer compression. She has no issues or complaints today. She reports improvement in wound healing. She denies signs of infection. 6/21; patient presents for follow-up. We are continuing with [facility] antibiotic and Sorbact under 3 layer compression. Patient has no complaints. Continued wound healing is happening. She denies signs of infection. 6/28; patient presents for follow-up. We have been using Keystone antibiotic with Sorbact under 3 layer compression. Usually home health comes out and changes the dressing twice a week. Unfortunately they did not go out to change the dressing. It is unclear why. Patient did not call them. She currently denies signs of infection. 7/5; patient presents for follow-up. We have been using Keystone antibiotic with calcium alginate under 3 layer compression. She reports  improvement in wound healing. She denies signs of infection. Home health has come out to do dressing changes twice this past week. 7/12; patient presents for follow-up. We have been using Keystone antibiotic with calcium alginate under 3 layer compression. Patient states that home health came out once last week to change the dressing. She reports improvement in wound healing. She currently denies signs of infection. 7/19; patient presents for follow-up. We have been using Keystone antibiotic with calcium alginate under 3 layer compression. Home health came out once last week to change the dressing. She has no issues or complaints today. She denies signs of infection. 8/2; patient presents for follow-up. We have been using Keystone antibiotic with calcium alginate under 3 layer compression. Unfortunately she missed her appointment last week and home health did not come out to do dressing changes. Patient currently denies signs of infection. 8/9; patient presents for follow-up. We have been using Keystone with calcium alginate under 3 layer compression. She states that home health came out once last week. She currently denies signs of infection. Her wrap was completely wet. She states she was cleaning the top of the leg and water soaked down into the wrap. 8/16; patient presents for follow-up. We have been using Keystone with calcium alginate under 3 layer compression. She states that home health came out twice last week. She has no issues or complaints today. 8/23; patient presents for follow-up. He has been using Keystone with calcium alginate under 3 layer compression. Home health came out twice last week. She denies signs of infection. 8/30; patient presents for follow-up. We have been using Keystone with calcium alginate under 3 layer compression. Home health came out once last week to change the dressing. Patient reports improvement in wound healing. She states she is almost done with her  chemotherapy infusions and has 1 more left. 9/13; patient presents for follow-up. She has lost the capsules to her [facility] antibiotic which I believe is the vancomycin pills. She has her Zosyn powder today. We have been using Keystone antibiotic ointment with calcium alginate under 3 layer compression. She is concerned about systemic infection however her vitals are stable and there is no surrounding soft tissue infection. She would like to remain a patient in our wound care center however would like a second opinion for her wound care at another facility. She asked to be referred to [facility] wound care center. 9/20; patient presents for follow-up. She found her vancomycin capsules and brought in her complete Keystone antibiotic ointment set today. Unfortunately she has developed skin breakdown and Erythema to the right lower extremity With increased swelling. She states she went to a pow wow Over the weekend and was on her feet for extended periods of time. She saw her oncologist yesterday who prescribed her doxycycline for her right lower extremity erythema. 9/27; patient presents for follow-up. We have been using Keystone antibiotic with Aquacel under 3 layer compression to the lower extremities bilaterally. When Laura Santiago, Laura Santiago ([id number]) [id number] Page 5 of 13 home health came and changed the wrap she secretly put coffee into the spray mix along with [facility] antibiotic on her leg thinking the acidic component would better activate the zoysn (sonething she discussed with her microbiologist brother). She has reported improvement in wound healing. 10/4; patient presents for follow-up. She has no issues or complaints today. We have been doing Aquacel and keystone under 3 layer compression to the lower extremities bilaterally. This morning she took the right lower extremity wrap off as  it was uncomfortable. She has no open wounds to this leg. 10/11; patient presents for  follow-up. We have been doing Aquacel with Keystone antibiotic ointment under 3 layer compression to the left lower extremity. She developed a small blister to the anterior aspect of the left leg noticed when the wrap was taken off on intake. She currently denies signs of infection. 10/18; patient presents for follow-up. We have been doing Aquacel with Keystone antibiotic ointment under 3 layer compression to the left lower extremity. There has been continued improvement in wound healing. She denies signs of infection. 10/25; patient arrives for treatment of venous insufficiency ulcers on her left lower leg both lateral and medial are remanence of apparently a circumferential wound. Much improved. We are using topicals Keystone and Aquacel Ag under 3 layer compression we continue to make good progress. The patient talk to me at some length with regards to different things she has on her forehead and her Peri orbital area for which she is apparently applying [place]. She feels that what ever we are treating on her wounds is a more systemic problem. I really was not able to get a handle on what she is talking about however I did caution her not to put the Remer in her eyes. 11/1; her wounds continue to improve she is using Keystone and Aquacel Ag G under 3 layer compression. Our intake nurse notes erythema and edema in the right leg. The patient has a litany of concerns with regards to a rash on her forehead or ears and other systemic complaints. She has an appointment with dermatology on November 11 11/8; patient presents for follow-up. We have been using Keystone and Aquacel under 4-layer compression. She has no issues or complaints today. She reports improvement in wound healing. 11/15; patient presents for follow-up. We have been using Aquacel with Keystone antibiotic under 3 layer compression. Patient continues improvement in wound healing. 12/6; patient presents for follow-up. We have been  using Aquacel with Keystone antibiotic ointment under 3 layer compression. Wounds appear well-healing. 12/13; patient presents for follow-up. We have been using Aquacel with Keystone antibiotic under 3 layer compression. She has no issues or complaints today. 12/27 left lateral medial ankle. Superficial wounds remain there is significantly improved we are using Keystone backed with Zetuvit under 4-layer compression 1/3; patient presents for follow-up. Her wounds appear well-healing. We have been using Aquacel Ag with Keystone antibiotic ointment under compression therapy. This should be a 3 layer compression. 1/17; patient presents for follow-up. Her wounds on her left lower extremity are well-healing. We are using Aquacel Ag and Keystone antibiotic ointment under compression therapy. She missed her last clinic appointment. She states that the wrap has been changed twice weekly since she was last seen. Unfortunately she has developed increased warmth and redness to the right lower extremity consistent with cellulitis. She states this started a few days ago. 1/24; patient presents for follow-up. Her wounds on the left lower extremity are well-healing with Aquacel and Keystone antibiotic ointment under compression therapy. I prescribed Keflex at last clinic visit for cellulitis of the right lower extremity. Unfortunately this has not resolved. She did not follow-up with her PCP for contact our office about her symptoms. 1/31; patient presents for follow-up. We have been using Aquacel and Keystone antibiotic ointment under compression therapy to the left lower extremity. Wounds appear well-healing. She has been taking clindamycin for the past week. Her symptoms have improved slightly with a decrease in erythema and warmth to the  right lower extremity. She says her pain level has improved. However Symptoms have not completely resolved. She denies fever/chills, nausea/vomiting. 2/7; patient presents for  follow-up. We have been using Aquacel Ag with Keystone antibiotic ointment under compression therapy to the left lower extremity. For the past week home health has not been using Keystone antibiotic ointment. She continues to take clindamycin. Her symptoms have improved greatly with the decrease in erythema and warmth to the right lower extremity. She denies systemic signs of infection. She has an area of skin breakdown to the right anterior leg. Electronic Signature(s) Signed: 01/17/2023 11:08:56 AM By: Kalman Shan DO Entered By: Kalman Shan on 01/17/2023 11:03:49 -------------------------------------------------------------------------------- Physical Exam Details Patient Name: Date of Service: Laura Jewett NNIE Santiago. 01/17/2023 10:00 A Santiago Medical Record Number: [id number] Patient Account Number: [id number] Date of Birth/Sex: Treating RN: 09/24/1946 (77 y.o. Laura Santiago Primary Care Provider: [place] Other Clinician: [name] Referring Provider: Treating Provider/Extender: Kalman Shan [facility] Weeks in Treatment: [address] Laura Santiago, Laura Santiago ([id number]) [phone].pdf Page 6 of 13 . Cardiovascular . Psychiatric . Notes o the left lower extremity there are areas with nonviable tissue. Postdebridement there are a few small scattered wounds to the lateral and medial aspect with granulation tissue. No signs of infection to the sites. T the right lower extremity there are areas of skin breakdown to the anterior aspect. Slight increase in o warmth and erythema to a small area on the lower extremity. Symptoms appear almost resolved. Electronic Signature(s) Signed: 01/17/2023 11:08:56 AM By: Kalman Shan DO Entered By: Kalman Shan on 01/17/2023 11:04:51 -------------------------------------------------------------------------------- Physician Orders Details Patient Name: Date of Service: Laura Jewett NNIE  Santiago. 01/17/2023 10:00 A Santiago Medical Record Number: [id number] Patient Account Number: [id number] Date of Birth/Sex: Treating RN: [birth year] (77 y.o. Laura Santiago Primary Care Provider: [place] Other Clinician: [name] Referring Provider: Treating Provider/Extender: [name] Weeks in Treatment: 48 Verbal / Phone Orders: No Diagnosis Coding Follow-up Appointments Return Appointment in 1 week. Nurse Visit as needed [place]: Rocky Morel [phone] [place] for wound care. May utilize formulary equivalent dressing for wound treatment orders unless otherwise specified. Home Health Nurse may visit PRN to address patients wound care needs. - home health to see patient 2 times per week, patient to be seen at wound clinic once per week **Please direct any NON-WOUND related issues/requests for orders to patient's Primary Care Physician. **If current dressing causes regression in wound condition, may D/C ordered dressing product/s and apply Normal Saline Moist Dressing daily until next [place] or Other MD appointment. **Notify [facility] of regression in wound condition at [phone]. Other Home Health Orders/Instructions: Bathing/ Shower/ Hygiene May shower with wound dressing protected with water repellent cover or cast protector. No tub bath. Anesthetic (Use 'Patient Medications' Section for Anesthetic Order Entry) Lidocaine applied to wound bed Edema Control - Lymphedema / Segmental Compressive Device / Other Optional: One layer of unna paste to top of compression wrap (to act as an anchor). Elevate, Exercise Daily and A void Standing for Long Periods of Time. Elevate legs to the level of the heart and pump ankles as often as possible Elevate leg(s) parallel to the floor when sitting. DO YOUR BEST to sleep in the bed at night. DO NOT sleep in your recliner. Long hours of sitting in a  recliner leads to swelling of the legs and/or potential  wounds on your backside. Additional Orders / Instructions Follow Nutritious Diet and Increase Protein Intake Medications-Please add to medication list. Keystone Compound - USE WHEN CHANGING WRAP! ntibiotics - Continue clindamycin right leg cellulitis P.O. Laura Santiago, Laura Santiago ([id number]) [id number] Page 7 of 13 Wound Treatment Wound #1 - Lower Leg Wound Laterality: Left, Medial Cleanser: Wound Cleanser 3 x Per Week/30 Days Discharge Instructions: Wash your hands with soap and water. Remove old dressing, discard into plastic bag and place into trash. Cleanse the wound with Wound Cleanser prior to applying a clean dressing using gauze sponges, not tissues or cotton balls. Do not scrub or use excessive force. Pat dry using gauze sponges, not tissue or cotton balls. Topical: Triamcinolone Acetonide Cream, 0.1%, 15 (g) tube 3 x Per Week/30 Days Discharge Instructions: lower leg Prim Dressing: AquacelAg Advantage Dressing, 2X2 (in/in) 3 x Per Week/30 Days ary Discharge Instructions: Apply to wound as directed Secondary Dressing: Zetuvit Plus 4x8 (in/in) 3 x Per Week/30 Days Compression Wrap: Medichoice 4 layer Compression System, 35-40 mmHG 3 x Per Week/30 Days Discharge Instructions: Apply multi-layer wrap as directed. Wound #8 - Lower Leg Wound Laterality: Left, Lateral Cleanser: Wound Cleanser 3 x Per Week/30 Days Discharge Instructions: Wash your hands with soap and water. Remove old dressing, discard into plastic bag and place into trash. Cleanse the wound with Wound Cleanser prior to applying a clean dressing using gauze sponges, not tissues or cotton balls. Do not scrub or use excessive force. Pat dry using gauze sponges, not tissue or cotton balls. Topical: Triamcinolone Acetonide Cream, 0.1%, 15 (g) tube 3 x Per Week/30 Days Discharge Instructions: Mix 1:1 with Nystatin cream and apply to periwound and  leg Prim Dressing: AquacelAg Advantage Dressing, 2X2 (in/in) 3 x Per Week/30 Days ary Discharge Instructions: Apply to wound as directed Secondary Dressing: Zetuvit Plus 4x8 (in/in) 3 x Per Week/30 Days Compression Wrap: Medichoice 4 layer Compression System, 35-40 mmHG 3 x Per Week/30 Days Discharge Instructions: Apply multi-layer wrap as directed. Wound #9 - Lower Leg Wound Laterality: Right, Midline Cleanser: Wound Cleanser 3 x Per Week/30 Days Discharge Instructions: Wash your hands with soap and water. Remove old dressing, discard into plastic bag and place into trash. Cleanse the wound with Wound Cleanser prior to applying a clean dressing using gauze sponges, not tissues or cotton balls. Do not scrub or use excessive force. Pat dry using gauze sponges, not tissue or cotton balls. Prim Dressing: AquacelAg Advantage Dressing, 2X2 (in/in) 3 x Per Week/30 Days ary Discharge Instructions: Apply to wound as directed Compression Wrap: Tubi D on Right Leg 3 x Per Week/30 Days Electronic Signature(s) Signed: 01/17/2023 12:08:51 PM By: Kalman Shan DO Signed: 01/17/2023 2:30:14 PM By: Gretta Cool, BSN, RN, CWS, [name] RN, BSN Previous Signature: 01/17/2023 11:08:56 AM Version By: Kalman Shan DO Entered By: Gretta Cool BSN, RN, CWS, [name] on 01/17/2023 11:17:51 -------------------------------------------------------------------------------- Problem List Details Patient Name: Date of Service: Laura Jewett NNIE Santiago. 01/17/2023 10:00 A Santiago Medical Record Number: [id number] Patient Account Number: [id number] Date of Birth/Sex: Treating RN: Aug 11, 1946 (78 y.o. Laura Santiago Primary Care Provider: [place] Other Clinician: [name] Referring Provider: Treating Provider/Extender: Kalman Shan [facility] Weeks in Treatment: [address], [place] Santiago ([id number]) [phone].pdf Page 8 of 13 Active Problems ICD-10 Encounter Code Description Active Date  MDM Diagnosis 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sed11/23/2022 No Yes I87.312 Chronic venous hypertension (idiopathic) with ulcer of left lower  extremity 11/02/2021 No Yes I87.311 Chronic venous hypertension (idiopathic) with ulcer of right lower extremity 08/30/2022 No Yes I87.2 Venous insufficiency (chronic) (peripheral) 07/06/2021 No Yes Z79.01 Long term (current) use of anticoagulants 07/06/2021 No Yes I10 Essential (primary) hypertension 07/06/2021 No Yes 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 07/06/2021 No Yes L03.115 Cellulitis of right lower limb 12/27/2022 No Yes S81.801A Unspecified open wound, right lower leg, initial encounter 01/17/2023 No Yes Inactive Problems ICD-10 Code Description Active Date Inactive Date S81.802A Unspecified open wound, left lower leg, initial encounter 07/06/2021 07/06/2021 S91.101A Unspecified open wound of right great toe without damage to nail, initial encounter 08/24/2021 08/24/2021 S91.104A Unspecified open wound of right lesser toe(s) without damage to nail, initial encounter 08/24/2021 08/24/2021 Resolved Problems ICD-10 Code Description Active Date Resolved Date S91.104D Unspecified open wound of right lesser toe(s) without damage to nail, subsequent 08/31/2021 08/31/2021 encounter S91.201D Unspecified open wound of right great toe with damage to nail, subsequent encounter 08/31/2021 08/31/2021 Electronic Signature(s) Signed: 01/17/2023 11:08:56 AM By: Kalman Shan DO Entered By: Kalman Shan on 01/17/2023 11:02:33 Laura Santiago ([id number]) [id number] Page 9 of 13 -------------------------------------------------------------------------------- Progress Note Details Patient Name: Date of Service: Laura Ace. 01/17/2023 10:00 A Santiago Medical Record Number: [id number] Patient Account Number: [id number] Date of Birth/Sex: Treating RN: 13-Apr-1946 (77 y.o. Laura Santiago Primary Care Provider: [place] Other Clinician: [name] Referring Provider: Treating Provider/Extender: Kalman Shan [facility] Weeks in Treatment: 55 Subjective Chief Complaint Information obtained from Patient Left lower extremity wound Right toe wounds Left upper lateral thigh wounds History of Present Illness (HPI) Admission 7/27 Ms. Laresha Bacorn is a 77 year old female with a past medical history of ADHD, metastatic breast cancer, stage IV chronic kidney disease, history of DVT on Xarelto and chronic venous insufficiency that presents to the clinic for a chronic left lower extremity wound. She recently moved to [facility] 4 days ago. She was being followed by wound care center in [name]. She reports a 10-year history of wounds to her left lower extremity that eventually do heal with debridement and compression therapy. She states that the current wound reopened 4 months ago and she is using Vaseline and Coban. She denies signs of infection. 8/3; patient presents for 1 week follow-up. She reports no issues or complaints today. She states she had vascular studies done in the last week. She denies signs of infection. She brought her little service dog with her today. 8/17; patient presents for follow-up. She has missed her last clinic appointment. She states she took the wrap off and attempted to rewrap her leg. She is having difficulty with transportation. She has her service dog with her today. Overall she feels well and reports improvement in wound healing. She denies signs of infection. She reports owning an old Velcro wrap compression and has this at her living facility 9/14; patient presents for follow-up. Patient states that over the past 2 to 3 weeks she developed toe wounds to her right foot. She attributes this to tight fitting shoes. She subsequently developed cellulitis in the right leg and has been treated by doxycycline by  her oncologist. She reports improvement in symptoms however continues to have some redness and swelling to this leg. T the left lower extremity patient has been having her wraps changed with home health twice weekly. She states that the [facility] is not helping control o the drainage. Other than that she has no issues or complaints today. She  denies signs of infection to the left lower extremity. 9/21; patient presents for follow-up. She reports seeing infectious disease for her cellulitis. She reports no further management. She has home health that changes the wraps twice weekly. She has no issues or complaints today. She denies signs of infection. 10/5; patient presents for follow-up. She has no issues or complaints today. She denies signs of infection. She states that the right great toe has not been dressed by home health. 10/12; patient presents for follow-up. She has no issues or complaints today. She reports improvement in her wound healing. She has been using silver alginate to the right great toe wound. She denies signs of infection. 10/26; patient presents for follow-up. Home health did not have sorbact so they continued to use Hydrofera Blue under the wrap. She has been using silver alginate to the great toe wound however she did not have a dressing in place today. She currently denies signs of infection. 11/2; patient presents for follow-up. She has been using sorb act under the compression wrap. She reports using silver alginate to the toe wound again she does not have a dressing in place. She currently denies signs of infection. 11/23; patient presents for follow-up. Unfortunately she has missed her last 2 clinic appointments. She was last seen 3 weeks ago. She did her own compression wrap with Kerlix and Coban yesterday after seeing vein and vascular. She has not been dressing her right great toe wound. She currently denies signs of infection. 11/30; patient presents for 1 week  follow-up. She states she changed her dressing last week prior to home health and use sorb act with Dakin's and Hydrofera Blue. Home health has changed the dressing as well and they have been using sorbact. T oday she reports increased redness to her right lower extremity. She has a history of cellulitis to this leg. She has been using silver alginate to the right great toe. Unfortunately she had an episode of diarrhea prior to coming in and had feces all over the right leg and to the wrap of her left leg. 12/7; patient presents for 1 week follow-up. She states that home health did not come out to change the dressing and she took it off yesterday. It is unclear if she is dressing the right toe wound. She denies signs of infection. 12/14; patient presents for 1 week follow-up. She has no issues or complaints today. 12/21; patient presents for follow-up. She has no issues or complaints today. She denies signs of infection. 12/28/2021; patient presents for follow-up. She was hospitalized for sepsis secondary to right lower extremity cellulitis On 12/23. She states she is currently at a SNF. She states that she was started on doxycycline this morning for her right great toe swelling and redness. She is not sure what dressings have been done to her left lower extremity for the past 3 weeks. She says its been mainly gauze with an Ace wrap. 1/25; patient presents for follow-up. She is still residing in a skilled nursing facility. She reports mild pain to the left lower extremity wound bed. She states she is going to see a podiatrist soon. Laura Santiago, Laura Santiago ([id number]) [id number] Page 10 of 13 2/8; patient presents for follow-up. She has moved back to her residential community from her skilled nursing facility. She has no issues or complaints today. She denies signs of systemic infections. 2/15; patient presents for follow-up. He has no issues or complaints today. She denies  systemic signs of infection. 2/22; patient presents  for follow-up. She has no issues or complaints today. She denies signs of infection. 3/1; patient presents for follow-up. She states that home health came out the day after she was seen in our clinic and yesterday to do the wrap change. She denies signs of infection. She reports excoriated skin on the ankle. 3/8; patient presents for follow-up. She has no issues or complaints today. She denies signs of infection. 3/15; patient presents for follow-up. Home health has been coming out to change the dressings. She reports more tenderness to the wound site. She denies purulent drainage, increased warmth or erythema to the area. 4/5; patient presents for follow-up. She has missed her last 2 clinic appointments. I have not seen her in 3 weeks. She was recently hospitalized for altered mental status. She was involuntarily committed. She was evaluated by psychiatry and deemed to have competency. There was no specific cause of her altered mental status. It was concluded that her physical and mental health were declining due to her chronic medical conditions. Currently home health has been coming out for dressing changes. Patient has also been doing her own dressing changes. She reports more skin breakdown to the periwound and now has a new wound. She denies fever/chills. She reports continued tenderness to the wound site. 4/12; patient with significant venous insufficiency and a large wound on her left lower leg taking up about 80% of the circumference of her lower leg. Cultures of this grew MRSA and Pseudomonas. She had completed a course of ciprofloxacin now is starting doxycycline. She has been using Dakin's wet-to-dry and a Tubigrip. She has home health twice a week and we change it once. 4/19; patient presents for follow-up. She completed her course of doxycycline. She has been using Dakin's wet-to-dry dressing and Tubigrip. Home health changes the  dressing twice weekly. Currently she has no issues or complaints. 4/26; patient presents for follow-up. At last clinic visit orders for home health were Iodosorb under compression therapy. Unfortunately they did not have the dressing and have been using Dakin's and gentamicin under the wrap. Patient currently denies signs of infection. She has no issues or complaints today. 5/3; patient presents for follow-up. Again Iodosorb has not been used under the compression therapy when home health comes out to change the wrap and dressing. They have been using Sorbact. It is unclear why this is happening since we send orders weekly to the agency. She denies signs of infection. Patient has not purchased the [place] antibiotics. We reached out to the company and they said they have been trying to contact her on a regular basis. We gave the patient the number to call to order the medication. 5/10; patient presents for follow-up. She has no issues or complaints today. Again home health has not been using Iodosorb. Mepilex was on the wound bed. No other dressings noted. She brought in her Keystone antibiotics. She denies signs of infection. 5/17; patient presents for follow-up. Home health has come out twice since she was last seen. Joint well she has been using Keystone antibiotic with Sorbact under the compression wrap. She has no issues or complaints today. She denies signs of infection. 5/24; patient presents for follow-up. We have been using Keystone antibiotics with Sorbact under compression therapy. She is tolerating the treatment well. She is reporting improvement in wound healing. She denies signs of infection. 5/31; patient presents for follow-up. We continue to do [facility] antibiotics with Sorbact under compression therapy. She continues to report improvement in wound healing. Home health  comes out and changes the dressing once weekly. 05-17-2022 upon evaluation today patient appears to be doing better in  regard to her wound especially compared to the last time I saw her. Fortunately I do think that she is seeing improvements. With that being said I do believe that she may be benefit from sharp debridement today to clear away some of the necrotic debris I discussed that with her as well. She is an amendable to that plan. Otherwise she is very pleased with how the [employer] is doing for her. 6/14; patient presents for follow-up. We have been using Keystone antibiotic with Sorbact and absorbent dressings under 3 layer compression. She has no issues or complaints today. She reports improvement in wound healing. She denies signs of infection. 6/21; patient presents for follow-up. We are continuing with [facility] antibiotic and Sorbact under 3 layer compression. Patient has no complaints. Continued wound healing is happening. She denies signs of infection. 6/28; patient presents for follow-up. We have been using Keystone antibiotic with Sorbact under 3 layer compression. Usually home health comes out and changes the dressing twice a week. Unfortunately they did not go out to change the dressing. It is unclear why. Patient did not call them. She currently denies signs of infection. 7/5; patient presents for follow-up. We have been using Keystone antibiotic with calcium alginate under 3 layer compression. She reports improvement in wound healing. She denies signs of infection. Home health has come out to do dressing changes twice this past week. 7/12; patient presents for follow-up. We have been using Keystone antibiotic with calcium alginate under 3 layer compression. Patient states that home health came out once last week to change the dressing. She reports improvement in wound healing. She currently denies signs of infection. 7/19; patient presents for follow-up. We have been using Keystone antibiotic with calcium alginate under 3 layer compression. Home health came out once last week to change the dressing.  She has no issues or complaints today. She denies signs of infection. 8/2; patient presents for follow-up. We have been using Keystone antibiotic with calcium alginate under 3 layer compression. Unfortunately she missed her appointment last week and home health did not come out to do dressing changes. Patient currently denies signs of infection. 8/9; patient presents for follow-up. We have been using Keystone with calcium alginate under 3 layer compression. She states that home health came out once last week. She currently denies signs of infection. Her wrap was completely wet. She states she was cleaning the top of the leg and water soaked down into the wrap. 8/16; patient presents for follow-up. We have been using Keystone with calcium alginate under 3 layer compression. She states that home health came out twice last week. She has no issues or complaints today. 8/23; patient presents for follow-up. He has been using Keystone with calcium alginate under 3 layer compression. Home health came out twice last week. She denies signs of infection. 8/30; patient presents for follow-up. We have been using Keystone with calcium alginate under 3 layer compression. Home health came out once last week to change the dressing. Patient reports improvement in wound healing. She states she is almost done with her chemotherapy infusions and has 1 more left. 9/13; patient presents for follow-up. She has lost the capsules to her [facility] antibiotic which I believe is the vancomycin pills. She has her Zosyn powder today. We have been using Keystone antibiotic ointment with calcium alginate under 3 layer compression. She is concerned about systemic infection  however her vitals are stable and there is no surrounding soft tissue infection. She would like to remain a patient in our wound care center however would like a second opinion for her wound care at another facility. She asked to be referred to [facility] wound care  center. Laura Santiago, Laura Santiago ([id number]) [id number] Page 11 of 13 9/20; patient presents for follow-up. She found her vancomycin capsules and brought in her complete Keystone antibiotic ointment set today. Unfortunately she has developed skin breakdown and Erythema to the right lower extremity With increased swelling. She states she went to a pow wow Over the weekend and was on her feet for extended periods of time. She saw her oncologist yesterday who prescribed her doxycycline for her right lower extremity erythema. 9/27; patient presents for follow-up. We have been using Keystone antibiotic with Aquacel under 3 layer compression to the lower extremities bilaterally. When home health came and changed the wrap she secretly put coffee into the spray mix along with [facility] antibiotic on her leg thinking the acidic component would better activate the zoysn (sonething she discussed with her microbiologist brother). She has reported improvement in wound healing. 10/4; patient presents for follow-up. She has no issues or complaints today. We have been doing Aquacel and keystone under 3 layer compression to the lower extremities bilaterally. This morning she took the right lower extremity wrap off as it was uncomfortable. She has no open wounds to this leg. 10/11; patient presents for follow-up. We have been doing Aquacel with Keystone antibiotic ointment under 3 layer compression to the left lower extremity. She developed a small blister to the anterior aspect of the left leg noticed when the wrap was taken off on intake. She currently denies signs of infection. 10/18; patient presents for follow-up. We have been doing Aquacel with Keystone antibiotic ointment under 3 layer compression to the left lower extremity. There has been continued improvement in wound healing. She denies signs of infection. 10/25; patient arrives for treatment of venous insufficiency ulcers on her left  lower leg both lateral and medial are remanence of apparently a circumferential wound. Much improved. We are using topicals Keystone and Aquacel Ag under 3 layer compression we continue to make good progress. The patient talk to me at some length with regards to different things she has on her forehead and her Peri orbital area for which she is apparently applying [place]. She feels that what ever we are treating on her wounds is a more systemic problem. I really was not able to get a handle on what she is talking about however I did caution her not to put the [place] in her eyes. 11/1; her wounds continue to improve she is using Keystone and Aquacel Ag G under 3 layer compression. Our intake nurse notes erythema and edema in the right leg. The patient has a litany of concerns with regards to a rash on her forehead or ears and other systemic complaints. She has an appointment with dermatology on November 11 11/8; patient presents for follow-up. We have been using Keystone and Aquacel under 4-layer compression. She has no issues or complaints today. She reports improvement in wound healing. 11/15; patient presents for follow-up. We have been using Aquacel with Keystone antibiotic under 3 layer compression. Patient continues improvement in wound healing. 12/6; patient presents for follow-up. We have been using Aquacel with Keystone antibiotic ointment under 3 layer compression. Wounds appear well-healing. 12/13; patient presents for follow-up. We have been using Aquacel with Keystone antibiotic under  3 layer compression. She has no issues or complaints today. 12/27 left lateral medial ankle. Superficial wounds remain there is significantly improved we are using Keystone backed with Zetuvit under 4-layer compression 1/3; patient presents for follow-up. Her wounds appear well-healing. We have been using Aquacel Ag with Keystone antibiotic ointment under compression therapy. This should be a 3 layer  compression. 1/17; patient presents for follow-up. Her wounds on her left lower extremity are well-healing. We are using Aquacel Ag and Keystone antibiotic ointment under compression therapy. She missed her last clinic appointment. She states that the wrap has been changed twice weekly since she was last seen. Unfortunately she has developed increased warmth and redness to the right lower extremity consistent with cellulitis. She states this started a few days ago. 1/24; patient presents for follow-up. Her wounds on the left lower extremity are well-healing with Aquacel and Keystone antibiotic ointment under compression therapy. I prescribed Keflex at last clinic visit for cellulitis of the right lower extremity. Unfortunately this has not resolved. She did not follow-up with her PCP for contact our office about her symptoms. 1/31; patient presents for follow-up. We have been using Aquacel and Keystone antibiotic ointment under compression therapy to the left lower extremity. Wounds appear well-healing. She has been taking clindamycin for the past week. Her symptoms have improved slightly with a decrease in erythema and warmth to the right lower extremity. She says her pain level has improved. However Symptoms have not completely resolved. She denies fever/chills, nausea/vomiting. 2/7; patient presents for follow-up. We have been using Aquacel Ag with Keystone antibiotic ointment under compression therapy to the left lower extremity. For the past week home health has not been using Keystone antibiotic ointment. She continues to take clindamycin. Her symptoms have improved greatly with the decrease in erythema and warmth to the right lower extremity. She denies systemic signs of infection. She has an area of skin breakdown to the right anterior leg. Objective Constitutional Vitals Time Taken: 10:26 AM, Height: 66 in, Weight: 153 lbs, BMI: 24.7, Temperature: 98.3 F, Pulse: 68 bpm, Respiratory Rate:  18 breaths/min, Blood Pressure: 125/66 mmHg. General Notes: o the left lower extremity there are areas with nonviable tissue. Postdebridement there are a few small scattered wounds to the lateral and medial aspect with granulation tissue. No signs of infection to the sites. T the right lower extremity there are areas of skin breakdown to the anterior aspect. o Slight increase in warmth and erythema to a small area on the lower extremity. Symptoms appear almost resolved. Integumentary (Hair, Skin) Wound #1 status is Open. Original cause of wound was Gradually Appeared. The date acquired was: 04/06/2021. The wound has been in treatment 80 weeks. The wound is located on the Left,Medial Lower Leg. The wound measures 0.1cm length x 0.1cm width x 0.1cm depth; 0.008cm^2 area and 0.001cm^3 volume. There is a medium amount of serosanguineous drainage noted. Wound #8 status is Open. Original cause of wound was Pressure Injury. The date acquired was: 10/18/2022. The wound has been in treatment 13 weeks. The wound is located on the Left,Lateral Lower Leg. The wound measures 0.3cm length x 0.6cm width x 0.1cm depth; 0.141cm^2 area and 0.014cm^3 volume. There is a medium amount of serosanguineous drainage noted. Wound #9 status is Open. Original cause of wound was Gradually Appeared. The date acquired was: 01/17/2023. The wound is located on the [address] Laura Santiago, Laura Santiago [place] ([id number]) [id number] Page 12 of 13 Leg. The wound measures 3.5cm length x 1.2cm width x 0.1cm  depth; 3.299cm^2 area and 0.33cm^3 volume. There is Fat Layer (Subcutaneous Tissue) exposed. There is no tunneling or undermining noted. There is a medium amount of serous drainage noted. There is no granulation within the wound bed. There is a large (67-100%) amount of necrotic tissue within the wound bed including Eschar. 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 I debrided nonviable tissue. I recommended continuing the course with Aquacel Ag and Keystone antibiotic spray under 3 layer compression. She has a little area of skin breakdown to the anterior right leg From recent history of cellulitis and complicated by venous insufficiency. I recommended antibiotic ointment here with Hydrofera Blue. Due to her recent history of cellulitis I recommended holding off on an in office wrap today. Can do Tubigrip daily. Procedures Wound #1 Pre-procedure diagnosis of Wound #1 is a Venous Leg Ulcer located on the Left,Medial Lower Leg .Severity of Tissue Pre Debridement is: Limited to breakdown of skin. There was a Selective/Open Wound Skin/Epidermis Debridement with a total area of 0.04 sq cm performed by Kalman Shan, MD. With the following instrument(s): Curette to remove Non-Viable tissue/material. Material removed includes Skin: Dermis and Skin: Epidermis and. A time out was conducted at 10:50, prior to the start of the procedure. There was no bleeding. The procedure was tolerated well. Post Debridement Measurements: 0.2cm length x 0.2cm width x 0.2cm depth; 0.006cm^3 volume. Character of Wound/Ulcer Post Debridement is stable. Severity of Tissue Post Debridement is: Fat layer exposed. Post procedure Diagnosis Wound #1: Same as Pre-Procedure Wound #8 Pre-procedure diagnosis of Wound #8 is a Venous Leg Ulcer located on the Left,Lateral Lower Leg .Severity of Tissue Pre  Debridement is: Fat layer exposed. There was a Selective/Open Wound Skin/Epidermis Debridement with a total area of 0.18 sq cm performed by Kalman Shan, MD. With the following instrument(s): Curette to remove Non-Viable tissue/material. Material removed includes Skin: Dermis and Skin: Epidermis and. A time out was conducted at 10:50, prior to the start of the procedure. There was no bleeding. The procedure was tolerated well. Post Debridement Measurements: 0.3cm length x 0.6cm width x 0.1cm depth; 0.014cm^3 volume. Character of Wound/Ulcer Post Debridement is stable. Severity of Tissue Post Debridement is: Fat layer exposed. Post procedure Diagnosis Wound #8: Same as Pre-Procedure Plan 1. In office sharp debridement 2. Aquacel Ag and Keystone antibiotic spray under 3 layer compressionooleft lower extremity 3. Antibiotic ointment with Hydrofera Blue to the right anterior leg under Tubigrip Electronic Signature(s) Signed: 01/17/2023 11:08:56 AM By: Kalman Shan DO Entered By: Kalman Shan on 01/17/2023 11:07:13 Laura Santiago ([id number]) [id number] Page 13 of 13 -------------------------------------------------------------------------------- SuperBill Details Patient Name: Date of Service: Laura Ace. 01/17/2023 Medical Record Number: [id number] Patient Account Number: [id number] Date of Birth/Sex: Treating RN: [birth year] (77 y.o. Laura Santiago Primary Care Provider: Roland Other Clinician: [name] Referring Provider: Treating Provider/Extender: Kalman Shan [facility] Weeks in Treatment: 27 Diagnosis Coding ICD-10 Codes Code Description [id number] 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.312 Chronic venous hypertension (idiopathic) with ulcer of  left lower extremity I87.311 Chronic venous hypertension (idiopathic) with ulcer of right lower extremity I87.2  Venous insufficiency (chronic) (peripheral) Z79.01 Long term (current) use of anticoagulants I10 Essential (primary) hypertension 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 Cellulitis of right lower limb S81.801A Unspecified open wound, right lower leg, initial encounter Facility Procedures : CPT4 Code: [id number] Description: 58850 - DEBRIDE WOUND 1ST 20 SQ CM OR < ICD-10 Diagnosis Description 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se Modifier: d Quantity: 1 Physician Procedures : CPT4 Code Description Modifier [id number] - WC PHYS LEVEL 3 - EST PT 25 ICD-10 Diagnosis Description S81.801A Unspecified open wound, right lower leg, initial encounter L03.115 Cellulitis of right lower limb I87.312 Chronic venous hypertension  (idiopathic) with ulcer of left lower extremity I87.311 Chronic venous hypertension (idiopathic) with ulcer of right lower extremity Quantity: 1 : [id number] - WC PHYS DEBR WO ANESTH 20 SQ CM ICD-10 Diagnosis Description 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sed Quantity: 1 Electronic Signature(s) Signed: 01/17/2023 11:08:56 AM By: Kalman Shan DO Entered By: Kalman Shan on 01/17/2023 11:08:03

## 2023-01-18 ENCOUNTER — Other Ambulatory Visit: Payer: Self-pay | Admitting: Oncology

## 2023-01-19 MED ORDER — OXYCODONE HCL 10 MG PO TABS: 15.0000 mg | ORAL_TABLET | Freq: Four times a day (QID) | ORAL | 0 refills | Status: DC | PRN

## 2023-01-24 ENCOUNTER — Encounter (HOSPITAL_BASED_OUTPATIENT_CLINIC_OR_DEPARTMENT_OTHER): Payer: Medicare Other | Admitting: Internal Medicine

## 2023-01-24 DIAGNOSIS — I87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: Secondary | ICD-10-CM

## 2023-01-24 DIAGNOSIS — L97822 Non-pressure chronic ulcer of other part of left lower leg with fat layer exposed: Secondary | ICD-10-CM | POA: Diagnosis not present

## 2023-01-24 DIAGNOSIS — I87311 Chronic venous hypertension (idiopathic) with ulcer of right lower extremity: Secondary | ICD-10-CM | POA: Diagnosis not present

## 2023-01-24 DIAGNOSIS — S81801A Unspecified open wound, right lower leg, initial encounter: Secondary | ICD-10-CM

## 2023-01-27 NOTE — Progress Notes (Signed)
Laura Santiago ([id number]) [id number] Page 1 of 13 Visit Report for 01/24/2023 Chief Complaint Document Details Patient Name: Date of Service: Laura Santiago. 01/24/2023 10:00 [place] Record Number: [id number] Patient Account Number: [id number] Date of Birth/Sex: Treating RN: 03-22-1946 (77 y.o. Laura Santiago Primary Care Provider: Ruth Other Clinician: [name] Referring Provider: Treating Provider/Extender: [name] Weeks in Treatment: 14 Information Obtained from: Patient Chief Complaint Left lower extremity wound Right toe wounds Left upper lateral thigh wounds Electronic Signature(s) Signed: 01/24/2023 12:00:48 PM By: Kalman Shan DO Entered By: Kalman Shan on 01/24/2023 11:21:46 -------------------------------------------------------------------------------- HPI Details Patient Name: Date of Service: Laura Santiago [name] 01/24/2023 10:00 [place] Record Number: [id number] Patient Account Number: [id number] Date of Birth/Sex: Treating RN: 1946/08/22 (77 y.o. Laura Santiago Primary Care Provider: [place] Other Clinician: [name] Referring Provider: Treating Provider/Extender: Kalman Shan [facility] Weeks in Treatment: 9 History of Present Illness HPI Description: Admission 7/27 Ms. Laura Santiago is a 77 year old female with a past medical history of ADHD, metastatic breast cancer, stage IV chronic kidney disease, history of DVT on Xarelto and chronic venous insufficiency that presents to the clinic for a chronic left lower extremity wound. She recently moved to [facility] 4 days ago. She was being followed by wound care center in [name]. She reports a 10-year history of wounds to her left lower extremity that eventually do heal with debridement and compression therapy. She states that the current wound reopened 4 months ago  and she is using Vaseline and Coban. She denies signs of infection. 8/3; patient presents for 1 week follow-up. She reports no issues or complaints today. She states she had vascular studies done in the last week. She denies signs of infection. She brought her little service dog with her today. 8/17; patient presents for follow-up. She has missed her last clinic appointment. She states she took the wrap off and attempted to rewrap her leg. She is having difficulty with transportation. She has her service dog with her today. Overall she feels well and reports improvement in wound healing. She denies signs of infection. She reports owning an old Velcro wrap compression and has this at her living facility 9/14; patient presents for follow-up. Patient states that over the past 2 to 3 weeks she developed toe wounds to her right foot. She attributes this to tight fitting Laura Santiago ([id number]) [id number] Page 2 of 13 shoes. She subsequently developed cellulitis in the right leg and has been treated by doxycycline by her oncologist. She reports improvement in symptoms however continues to have some redness and swelling to this leg. T the left lower extremity patient has been having her wraps changed with home health twice weekly. She states that the [facility] is not helping control o the drainage. Other than that she has no issues or complaints today. She denies signs of infection to the left lower extremity. 9/21; patient presents for follow-up. She reports seeing infectious disease for her cellulitis. She reports no further management. She has home health that changes the wraps twice weekly. She has no issues or complaints today. She denies signs of infection. 10/5; patient presents for follow-up. She has no issues or complaints today. She denies signs of infection. She states that the right great toe has not been dressed by home health. 10/12; patient presents  for follow-up. She has no issues or complaints today.  She reports improvement in her wound healing. She has been using silver alginate to the right great toe wound. She denies signs of infection. 10/26; patient presents for follow-up. Home health did not have sorbact so they continued to use Hydrofera Blue under the wrap. She has been using silver alginate to the great toe wound however she did not have a dressing in place today. She currently denies signs of infection. 11/2; patient presents for follow-up. She has been using sorb act under the compression wrap. She reports using silver alginate to the toe wound again she does not have a dressing in place. She currently denies signs of infection. 11/23; patient presents for follow-up. Unfortunately she has missed her last 2 clinic appointments. She was last seen 3 weeks ago. She did her own compression wrap with Kerlix and Coban yesterday after seeing vein and vascular. She has not been dressing her right great toe wound. She currently denies signs of infection. 11/30; patient presents for 1 week follow-up. She states she changed her dressing last week prior to home health and use sorb act with Dakin's and Hydrofera Blue. Home health has changed the dressing as well and they have been using sorbact. T oday she reports increased redness to her right lower extremity. She has a history of cellulitis to this leg. She has been using silver alginate to the right great toe. Unfortunately she had an episode of diarrhea prior to coming in and had feces all over the right leg and to the wrap of her left leg. 12/7; patient presents for 1 week follow-up. She states that home health did not come out to change the dressing and she took it off yesterday. It is unclear if she is dressing the right toe wound. She denies signs of infection. 12/14; patient presents for 1 week follow-up. She has no issues or complaints today. 12/21; patient presents for follow-up. She  has no issues or complaints today. She denies signs of infection. 12/28/2021; patient presents for follow-up. She was hospitalized for sepsis secondary to right lower extremity cellulitis On 12/23. She states she is currently at a SNF. She states that she was started on doxycycline this morning for her right great toe swelling and redness. She is not sure what dressings have been done to her left lower extremity for the past 3 weeks. She says its been mainly gauze with an Santiago wrap. 1/25; patient presents for follow-up. She is still residing in a skilled nursing facility. She reports mild pain to the left lower extremity wound bed. She states she is going to see a podiatrist soon. 2/8; patient presents for follow-up. She has moved back to her residential community from her skilled nursing facility. She has no issues or complaints today. She denies signs of systemic infections. 2/15; patient presents for follow-up. He has no issues or complaints today. She denies systemic signs of infection. 2/22; patient presents for follow-up. She has no issues or complaints today. She denies signs of infection. 3/1; patient presents for follow-up. She states that home health came out the day after she was seen in our clinic and yesterday to do the wrap change. She denies signs of infection. She reports excoriated skin on the ankle. 3/8; patient presents for follow-up. She has no issues or complaints today. She denies signs of infection. 3/15; patient presents for follow-up. Home health has been coming out to change the dressings. She reports more tenderness to the wound site. She denies purulent drainage, increased warmth or erythema  to the area. 4/5; patient presents for follow-up. She has missed her last 2 clinic appointments. I have not seen her in 3 weeks. She was recently hospitalized for altered mental status. She was involuntarily committed. She was evaluated by psychiatry and deemed to have competency. There  was no specific cause of her altered mental status. It was concluded that her physical and mental health were declining due to her chronic medical conditions. Currently home health has been coming out for dressing changes. Patient has also been doing her own dressing changes. She reports more skin breakdown to the periwound and now has a new wound. She denies fever/chills. She reports continued tenderness to the wound site. 4/12; patient with significant venous insufficiency and a large wound on her left lower leg taking up about 80% of the circumference of her lower leg. Cultures of this grew MRSA and Pseudomonas. She had completed a course of ciprofloxacin now is starting doxycycline. She has been using Dakin's wet-to-dry and a Tubigrip. She has home health twice a week and we change it once. 4/19; patient presents for follow-up. She completed her course of doxycycline. She has been using Dakin's wet-to-dry dressing and Tubigrip. Home health changes the dressing twice weekly. Currently she has no issues or complaints. 4/26; patient presents for follow-up. At last clinic visit orders for home health were Iodosorb under compression therapy. Unfortunately they did not have the dressing and have been using Dakin's and gentamicin under the wrap. Patient currently denies signs of infection. She has no issues or complaints today. 5/3; patient presents for follow-up. Again Iodosorb has not been used under the compression therapy when home health comes out to change the wrap and dressing. They have been using Sorbact. It is unclear why this is happening since we send orders weekly to the agency. She denies signs of infection. Patient has not purchased the [place] antibiotics. We reached out to the company and they said they have been trying to contact her on a regular basis. We gave the patient the number to call to order the medication. 5/10; patient presents for follow-up. She has no issues or complaints  today. Again home health has not been using Iodosorb. Mepilex was on the wound bed. No other dressings noted. She brought in her Keystone antibiotics. She denies signs of infection. 5/17; patient presents for follow-up. Home health has come out twice since she was last seen. Joint well she has been using Keystone antibiotic with Sorbact under the compression wrap. She has no issues or complaints today. She denies signs of infection. 5/24; patient presents for follow-up. We have been using Keystone antibiotics with Sorbact under compression therapy. She is tolerating the treatment well. She is reporting improvement in wound healing. She denies signs of infection. 5/31; patient presents for follow-up. We continue to do [facility] antibiotics with Sorbact under compression therapy. She continues to report improvement in wound healing. Home health comes out and changes the dressing once weekly. 05-17-2022 upon evaluation today patient appears to be doing better in regard to her wound especially compared to the last time I saw her. Fortunately I do think Laura Santiago, Laura Santiago ([id number]) [id number] Page 3 of 13 that she is seeing improvements. With that being said I do believe that she may be benefit from sharp debridement today to clear away some of the necrotic debris I discussed that with her as well. She is an amendable to that plan. Otherwise she is very pleased with how the [employer] is doing for her.  6/14; patient presents for follow-up. We have been using Keystone antibiotic with Sorbact and absorbent dressings under 3 layer compression. She has no issues or complaints today. She reports improvement in wound healing. She denies signs of infection. 6/21; patient presents for follow-up. We are continuing with [facility] antibiotic and Sorbact under 3 layer compression. Patient has no complaints. Continued wound healing is happening. She denies signs of infection. 6/28; patient  presents for follow-up. We have been using Keystone antibiotic with Sorbact under 3 layer compression. Usually home health comes out and changes the dressing twice a week. Unfortunately they did not go out to change the dressing. It is unclear why. Patient did not call them. She currently denies signs of infection. 7/5; patient presents for follow-up. We have been using Keystone antibiotic with calcium alginate under 3 layer compression. She reports improvement in wound healing. She denies signs of infection. Home health has come out to do dressing changes twice this past week. 7/12; patient presents for follow-up. We have been using Keystone antibiotic with calcium alginate under 3 layer compression. Patient states that home health came out once last week to change the dressing. She reports improvement in wound healing. She currently denies signs of infection. 7/19; patient presents for follow-up. We have been using Keystone antibiotic with calcium alginate under 3 layer compression. Home health came out once last week to change the dressing. She has no issues or complaints today. She denies signs of infection. 8/2; patient presents for follow-up. We have been using Keystone antibiotic with calcium alginate under 3 layer compression. Unfortunately she missed her appointment last week and home health did not come out to do dressing changes. Patient currently denies signs of infection. 8/9; patient presents for follow-up. We have been using Keystone with calcium alginate under 3 layer compression. She states that home health came out once last week. She currently denies signs of infection. Her wrap was completely wet. She states she was cleaning the top of the leg and water soaked down into the wrap. 8/16; patient presents for follow-up. We have been using Keystone with calcium alginate under 3 layer compression. She states that home health came out twice last week. She has no issues or complaints  today. 8/23; patient presents for follow-up. He has been using Keystone with calcium alginate under 3 layer compression. Home health came out twice last week. She denies signs of infection. 8/30; patient presents for follow-up. We have been using Keystone with calcium alginate under 3 layer compression. Home health came out once last week to change the dressing. Patient reports improvement in wound healing. She states she is almost done with her chemotherapy infusions and has 1 more left. 9/13; patient presents for follow-up. She has lost the capsules to her [facility] antibiotic which I believe is the vancomycin pills. She has her Zosyn powder today. We have been using Keystone antibiotic ointment with calcium alginate under 3 layer compression. She is concerned about systemic infection however her vitals are stable and there is no surrounding soft tissue infection. She would like to remain a patient in our wound care center however would like a second opinion for her wound care at another facility. She asked to be referred to [facility] wound care center. 9/20; patient presents for follow-up. She found her vancomycin capsules and brought in her complete Keystone antibiotic ointment set today. Unfortunately she has developed skin breakdown and Erythema to the right lower extremity With increased swelling. She states she went to a [employer]  Over the weekend and was on her feet for extended periods of time. She saw her oncologist yesterday who prescribed her doxycycline for her right lower extremity erythema. 9/27; patient presents for follow-up. We have been using Keystone antibiotic with Aquacel under 3 layer compression to the lower extremities bilaterally. When home health came and changed the wrap she secretly put coffee into the spray mix along with [facility] antibiotic on her leg thinking the acidic component would better activate the zoysn (sonething she discussed with her microbiologist brother). She has  reported improvement in wound healing. 10/4; patient presents for follow-up. She has no issues or complaints today. We have been doing Aquacel and keystone under 3 layer compression to the lower extremities bilaterally. This morning she took the right lower extremity wrap off as it was uncomfortable. She has no open wounds to this leg. 10/11; patient presents for follow-up. We have been doing Aquacel with Keystone antibiotic ointment under 3 layer compression to the left lower extremity. She developed a small blister to the anterior aspect of the left leg noticed when the wrap was taken off on intake. She currently denies signs of infection. 10/18; patient presents for follow-up. We have been doing Aquacel with Keystone antibiotic ointment under 3 layer compression to the left lower extremity. There has been continued improvement in wound healing. She denies signs of infection. 10/25; patient arrives for treatment of venous insufficiency ulcers on her left lower leg both lateral and medial are remanence of apparently a circumferential wound. Much improved. We are using topicals Keystone and Aquacel Ag under 3 layer compression we continue to make good progress. The patient talk to me at some length with regards to different things she has on her forehead and her Peri orbital area for which she is apparently applying [place]. She feels that what ever we are treating on her wounds is a more systemic problem. I really was not able to get a handle on what she is talking about however I did caution her not to put the [place] in her eyes. 11/1; her wounds continue to improve she is using Keystone and Aquacel Ag G under 3 layer compression. Our intake nurse notes erythema and edema in the right leg. The patient has a litany of concerns with regards to a rash on her forehead or ears and other systemic complaints. She has an appointment with dermatology on November 11 11/8; patient presents for follow-up. We  have been using Keystone and Aquacel under 4-layer compression. She has no issues or complaints today. She reports improvement in wound healing. 11/15; patient presents for follow-up. We have been using Aquacel with Keystone antibiotic under 3 layer compression. Patient continues improvement in wound healing. 12/6; patient presents for follow-up. We have been using Aquacel with Keystone antibiotic ointment under 3 layer compression. Wounds appear well-healing. 12/13; patient presents for follow-up. We have been using Aquacel with Keystone antibiotic under 3 layer compression. She has no issues or complaints today. 12/27 left lateral medial ankle. Superficial wounds remain there is significantly improved we are using Keystone backed with Zetuvit under 4-layer compression 1/3; patient presents for follow-up. Her wounds appear well-healing. We have been using Aquacel Ag with Keystone antibiotic ointment under compression therapy. This should be a 3 layer compression. 1/17; patient presents for follow-up. Her wounds on her left lower extremity are well-healing. We are using Aquacel Ag and Keystone antibiotic ointment under compression therapy. She missed her last clinic appointment. She states that the wrap has been changed  twice weekly since she was last seen. Unfortunately she has developed increased warmth and redness to the right lower extremity consistent with cellulitis. She states this started a few days ago. 1/24; patient presents for follow-up. Her wounds on the left lower extremity are well-healing with Aquacel and Keystone antibiotic ointment under compression therapy. I prescribed Keflex at last clinic visit for cellulitis of the right lower extremity. Unfortunately this has not resolved. She did not follow-up with her PCP Laura Santiago, Laura Santiago ([id number]) [id number] Page 4 of 13 for contact our office about her symptoms. 1/31; patient presents for follow-up. We have  been using Aquacel and Keystone antibiotic ointment under compression therapy to the left lower extremity. Wounds appear well-healing. She has been taking clindamycin for the past week. Her symptoms have improved slightly with a decrease in erythema and warmth to the right lower extremity. She says her pain level has improved. However Symptoms have not completely resolved. She denies fever/chills, nausea/vomiting. 2/7; patient presents for follow-up. We have been using Aquacel Ag with Keystone antibiotic ointment under compression therapy to the left lower extremity. For the past week home health has not been using Keystone antibiotic ointment. She continues to take clindamycin. Her symptoms have improved greatly with the decrease in erythema and warmth to the right lower extremity. She denies systemic signs of infection. She has an area of skin breakdown to the right anterior leg. 2/14; Patient presents for follow-up. She has been using Hydrofera Blue to the right anterior leg under Tubigrip. It looks like Hydrofera Blue is also being used with Keystone to the left lower extremity under compression therapy. Order is for [place] Ag. Overall wounds appear well healing. She has no issues or complaints. Electronic Signature(s) Signed: 01/24/2023 12:00:48 PM By: Kalman Shan DO Entered By: Kalman Shan on 01/24/2023 11:36:30 -------------------------------------------------------------------------------- Physical Exam Details Patient Name: Date of Service: Laura Santiago [name] 01/24/2023 10:00 A M Medical Record Number: [id number] Patient Account Number: [id number] Date of Birth/Sex: Treating RN: Nov 24, 1946 (77 y.o. Laura Santiago Primary Care Provider: [place] Other Clinician: [name] Referring Provider: Treating Provider/Extender: Kalman Shan [facility] Weeks in Treatment: 68 Constitutional . Psychiatric . Notes T the left lower extremity the  medial wound has healed. She still has a small open area to the lateral left leg. T the right lower extremity anterior aspect there o o is an area of skin breakdown. No increased warmth and erythema to the lower extremity. Electronic Signature(s) Signed: 01/24/2023 12:00:48 PM By: Kalman Shan DO Entered By: Kalman Shan on 01/24/2023 11:39:49 -------------------------------------------------------------------------------- Physician Orders Details Patient Name: Date of Service: Laura Santiago [name] 01/24/2023 10:00 A M Medical Record Number: [id number] Patient Account Number: [id number] Date of Birth/Sex: Treating RN: 1946-01-13 (77 y.o. Laura Santiago Primary Care Provider: [place] Other Clinician: Rosita, [place] ([id number]) [id number] Page 5 of 13 Referring Provider: Treating Provider/Extender: [name] Weeks in Treatment: 45 Verbal / Phone Orders: No Diagnosis Coding Follow-up Appointments Return Appointment in 1 week. Nurse Visit as needed [place]: Rocky Morel [phone] [place] for wound care. May utilize formulary equivalent dressing for wound treatment orders unless otherwise specified. Home Health Nurse may visit PRN to address patients wound care needs. - home health to see patient 2 times per week, patient to be seen at wound clinic once per week **Please direct any NON-WOUND related issues/requests for orders to patient's  Primary Care Physician. **If current dressing causes regression in wound condition, may D/C ordered dressing product/s and apply Normal Saline Moist Dressing daily until next [place] or Other MD appointment. **Notify Wound Healing Center of regression in wound condition at [phone]. Other Home Health Orders/Instructions: Bathing/ Shower/ Hygiene May shower with wound dressing protected with water repellent  cover or cast protector. No tub bath. Anesthetic (Use 'Patient Medications' Section for Anesthetic Order Entry) Lidocaine applied to wound bed Edema Control - Lymphedema / Segmental Compressive Device / Other Optional: One layer of unna paste to top of compression wrap (to act as an anchor). Elevate, Exercise Daily and A void Standing for Long Periods of Time. Elevate legs to the level of the heart and pump ankles as often as possible Elevate leg(s) parallel to the floor when sitting. DO YOUR BEST to sleep in the bed at night. DO NOT sleep in your recliner. Long hours of sitting in a recliner leads to swelling of the legs and/or potential wounds on your backside. Additional Orders / Instructions Follow Nutritious Diet and Increase Protein Intake Medications-Please add to medication list. Keystone Compound - USE ON BOTH WOUNDS WHEN CHANGING WRAP! Wound Treatment Wound #8 - Lower Leg Wound Laterality: Left, Lateral Cleanser: Wound Cleanser 3 x Per Week/30 Days Discharge Instructions: Wash your hands with soap and water. Remove old dressing, discard into plastic bag and place into trash. Cleanse the wound with Wound Cleanser prior to applying a clean dressing using gauze sponges, not tissues or cotton balls. Do not scrub or use excessive force. Pat dry using gauze sponges, not tissue or cotton balls. Topical: Triamcinolone Acetonide Cream, 0.1%, 15 (g) tube 3 x Per Week/30 Days Discharge Instructions: Mix 1:1 with Nystatin cream and apply to periwound and leg Prim Dressing: Hydrofera Blue Ready Transfer Foam, 2.5x2.5 (in/in) 3 x Per Week/30 Days ary Discharge Instructions: Apply Hydrofera Blue Ready to wound bed as directed Secondary Dressing: Zetuvit Plus 4x8 (in/in) 3 x Per Week/30 Days Compression Wrap: 3-LAYER WRAP - Profore Lite LF 3 Multilayer Compression Bandaging System 3 x Per Week/30 Days Discharge Instructions: Apply 3 multi-layer wrap as prescribed. Wound #9 - Lower Leg Wound  Laterality: Right, Midline Cleanser: Wound Cleanser 3 x Per Week/30 Days Discharge Instructions: Wash your hands with soap and water. Remove old dressing, discard into plastic bag and place into trash. Cleanse the wound with Wound Cleanser prior to applying a clean dressing using gauze sponges, not tissues or cotton balls. Do not scrub or use excessive force. Pat dry using gauze sponges, not tissue or cotton balls. Prim Dressing: Hydrofera Blue Ready Transfer Foam, 4x5 (in/in) 3 x Per Week/30 Days ary Discharge Instructions: Apply Hydrofera Blue Ready to wound bed as directed Compression Wrap: 3-LAYER WRAP - Profore Lite LF 3 Multilayer Compression Bandaging System 3 x Per Week/30 Days Discharge Instructions: Apply 3 multi-layer wrap as prescribed. Compression Wrap: Tubi D on Right Leg 3 x Per Week/30 Days Electronic Signature(s) Signed: 01/24/2023 12:00:48 PM By: Kalman Shan DO Entered By: Kalman Shan on 01/24/2023 11:44:31 [name] ([id number]) [id number] Page 6 of 13 -------------------------------------------------------------------------------- Problem List Details Patient Name: Date of Service: Laura Santiago. 01/24/2023 10:00 A M Medical Record Number: [id number] Patient Account Number: [id number] Date of Birth/Sex: Treating RN: Oct 08, 1946 (77 y.o. Laura Santiago Primary Care Provider: [place] Other Clinician: [name] Referring Provider: Treating Provider/Extender: Kalman Shan [facility] Weeks in Treatment: 72 Active Problems ICD-10 Encounter Code  Description Active Date MDM Diagnosis 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sed11/23/2022 No Yes 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 11/02/2021 No Yes I87.311 Chronic venous hypertension (idiopathic) with ulcer of right lower extremity 08/30/2022 No  Yes I87.2 Venous insufficiency (chronic) (peripheral) 07/06/2021 No Yes Z79.01 Long term (current) use of anticoagulants 07/06/2021 No Yes I10 Essential (primary) hypertension 07/06/2021 No Yes 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 07/06/2021 No Yes L03.115 Cellulitis of right lower limb 12/27/2022 No Yes S81.801A Unspecified open wound, right lower leg, initial encounter 01/17/2023 No Yes Inactive Problems ICD-10 Code Description Active Date Inactive Date S81.802A Unspecified open wound, left lower leg, initial encounter 07/06/2021 07/06/2021 S91.101A Unspecified open wound of right great toe without damage to nail, initial encounter 08/24/2021 08/24/2021 LEANY, FRANCHINA ([id number]) [id number] Page 7 of 13 S91.104A Unspecified open wound of right lesser toe(s) without damage to nail, initial encounter 08/24/2021 08/24/2021 Resolved Problems ICD-10 Code Description Active Date Resolved Date S91.104D Unspecified open wound of right lesser toe(s) without damage to nail, subsequent 08/31/2021 08/31/2021 encounter S91.201D Unspecified open wound of right great toe with damage to nail, subsequent encounter 08/31/2021 08/31/2021 Electronic Signature(s) Signed: 01/24/2023 12:00:48 PM By: Kalman Shan DO Entered By: Kalman Shan on 01/24/2023 11:21:40 -------------------------------------------------------------------------------- Progress Note Details Patient Name: Date of Service: Laura Santiago [name] 01/24/2023 10:00 [place] Record Number: [id number] Patient Account Number: [id number] Date of Birth/Sex: Treating RN: Oct 01, 1946 (77 y.o. Laura Santiago Primary Care Provider: [place] Other Clinician: [name] Referring Provider: Treating Provider/Extender: Kalman Shan [facility] Weeks in Treatment: 30 Subjective Chief Complaint Information obtained from Patient Left lower extremity wound Right toe wounds Left upper  lateral thigh wounds History of Present Illness (HPI) Admission 7/27 Ms. Llasmin Macik is a 77 year old female with a past medical history of ADHD, metastatic breast cancer, stage IV chronic kidney disease, history of DVT on Xarelto and chronic venous insufficiency that presents to the clinic for a chronic left lower extremity wound. She recently moved to [facility] 4 days ago. She was being followed by wound care center in [name]. She reports a 10-year history of wounds to her left lower extremity that eventually do heal with debridement and compression therapy. She states that the current wound reopened 4 months ago and she is using Vaseline and Coban. She denies signs of infection. 8/3; patient presents for 1 week follow-up. She reports no issues or complaints today. She states she had vascular studies done in the last week. She denies signs of infection. She brought her little service dog with her today. 8/17; patient presents for follow-up. She has missed her last clinic appointment. She states she took the wrap off and attempted to rewrap her leg. She is having difficulty with transportation. She has her service dog with her today. Overall she feels well and reports improvement in wound healing. She denies signs of infection. She reports owning an old Velcro wrap compression and has this at her living facility 9/14; patient presents for follow-up. Patient states that over the past 2 to 3 weeks she developed toe wounds to her right foot. She attributes this to tight fitting shoes. She subsequently developed cellulitis in the right leg and has been treated by doxycycline by her oncologist. She reports improvement in symptoms however continues to have some redness and swelling to this leg. T the left lower extremity patient has been having  her wraps changed with home health twice weekly. She states that the [facility] is not helping control o the drainage. Other than that she  has no issues or complaints today. She denies signs of infection to the left lower extremity. 9/21; patient presents for follow-up. She reports seeing infectious disease for her cellulitis. She reports no further management. She has home health that changes the wraps twice weekly. She has no issues or complaints today. She denies signs of infection. 10/5; patient presents for follow-up. She has no issues or complaints today. She denies signs of infection. She states that the right great toe has not been dressed by home health. 10/12; patient presents for follow-up. She has no issues or complaints today. She reports improvement in her wound healing. She has been using silver alginate to the right great toe wound. She denies signs of infection. 10/26; patient presents for follow-up. Home health did not have sorbact so they continued to use Hydrofera Blue under the wrap. She has been using silver MALEEYA, SOULLIERE ([id number]) [id number] Page 8 of 13 alginate to the great toe wound however she did not have a dressing in place today. She currently denies signs of infection. 11/2; patient presents for follow-up. She has been using sorb act under the compression wrap. She reports using silver alginate to the toe wound again she does not have a dressing in place. She currently denies signs of infection. 11/23; patient presents for follow-up. Unfortunately she has missed her last 2 clinic appointments. She was last seen 3 weeks ago. She did her own compression wrap with Kerlix and Coban yesterday after seeing vein and vascular. She has not been dressing her right great toe wound. She currently denies signs of infection. 11/30; patient presents for 1 week follow-up. She states she changed her dressing last week prior to home health and use sorb act with Dakin's and Hydrofera Blue. Home health has changed the dressing as well and they have been using sorbact. T oday she reports  increased redness to her right lower extremity. She has a history of cellulitis to this leg. She has been using silver alginate to the right great toe. Unfortunately she had an episode of diarrhea prior to coming in and had feces all over the right leg and to the wrap of her left leg. 12/7; patient presents for 1 week follow-up. She states that home health did not come out to change the dressing and she took it off yesterday. It is unclear if she is dressing the right toe wound. She denies signs of infection. 12/14; patient presents for 1 week follow-up. She has no issues or complaints today. 12/21; patient presents for follow-up. She has no issues or complaints today. She denies signs of infection. 12/28/2021; patient presents for follow-up. She was hospitalized for sepsis secondary to right lower extremity cellulitis On 12/23. She states she is currently at a SNF. She states that she was started on doxycycline this morning for her right great toe swelling and redness. She is not sure what dressings have been done to her left lower extremity for the past 3 weeks. She says its been mainly gauze with an Santiago wrap. 1/25; patient presents for follow-up. She is still residing in a skilled nursing facility. She reports mild pain to the left lower extremity wound bed. She states she is going to see a podiatrist soon. 2/8; patient presents for follow-up. She has moved back to her residential community from her skilled nursing facility. She has  no issues or complaints today. She denies signs of systemic infections. 2/15; patient presents for follow-up. He has no issues or complaints today. She denies systemic signs of infection. 2/22; patient presents for follow-up. She has no issues or complaints today. She denies signs of infection. 3/1; patient presents for follow-up. She states that home health came out the day after she was seen in our clinic and yesterday to do the wrap change. She denies signs of  infection. She reports excoriated skin on the ankle. 3/8; patient presents for follow-up. She has no issues or complaints today. She denies signs of infection. 3/15; patient presents for follow-up. Home health has been coming out to change the dressings. She reports more tenderness to the wound site. She denies purulent drainage, increased warmth or erythema to the area. 4/5; patient presents for follow-up. She has missed her last 2 clinic appointments. I have not seen her in 3 weeks. She was recently hospitalized for altered mental status. She was involuntarily committed. She was evaluated by psychiatry and deemed to have competency. There was no specific cause of her altered mental status. It was concluded that her physical and mental health were declining due to her chronic medical conditions. Currently home health has been coming out for dressing changes. Patient has also been doing her own dressing changes. She reports more skin breakdown to the periwound and now has a new wound. She denies fever/chills. She reports continued tenderness to the wound site. 4/12; patient with significant venous insufficiency and a large wound on her left lower leg taking up about 80% of the circumference of her lower leg. Cultures of this grew MRSA and Pseudomonas. She had completed a course of ciprofloxacin now is starting doxycycline. She has been using Dakin's wet-to-dry and a Tubigrip. She has home health twice a week and we change it once. 4/19; patient presents for follow-up. She completed her course of doxycycline. She has been using Dakin's wet-to-dry dressing and Tubigrip. Home health changes the dressing twice weekly. Currently she has no issues or complaints. 4/26; patient presents for follow-up. At last clinic visit orders for home health were Iodosorb under compression therapy. Unfortunately they did not have the dressing and have been using Dakin's and gentamicin under the wrap. Patient currently  denies signs of infection. She has no issues or complaints today. 5/3; patient presents for follow-up. Again Iodosorb has not been used under the compression therapy when home health comes out to change the wrap and dressing. They have been using Sorbact. It is unclear why this is happening since we send orders weekly to the agency. She denies signs of infection. Patient has not purchased the [facility] antibiotics. We reached out to the company and they said they have been trying to contact her on a regular basis. We gave the patient the number to call to order the medication. 5/10; patient presents for follow-up. She has no issues or complaints today. Again home health has not been using Iodosorb. Mepilex was on the wound bed. No other dressings noted. She brought in her Keystone antibiotics. She denies signs of infection. 5/17; patient presents for follow-up. Home health has come out twice since she was last seen. Joint well she has been using Keystone antibiotic with Sorbact under the compression wrap. She has no issues or complaints today. She denies signs of infection. 5/24; patient presents for follow-up. We have been using Keystone antibiotics with Sorbact under compression therapy. She is tolerating the treatment well. She is reporting improvement in  wound healing. She denies signs of infection. 5/31; patient presents for follow-up. We continue to do [facility] antibiotics with Sorbact under compression therapy. She continues to report improvement in wound healing. Home health comes out and changes the dressing once weekly. 05-17-2022 upon evaluation today patient appears to be doing better in regard to her wound especially compared to the last time I saw her. Fortunately I do think that she is seeing improvements. With that being said I do believe that she may be benefit from sharp debridement today to clear away some of the necrotic debris I discussed that with her as well. She is an amendable to  that plan. Otherwise she is very pleased with how the [employer] is doing for her. 6/14; patient presents for follow-up. We have been using Keystone antibiotic with Sorbact and absorbent dressings under 3 layer compression. She has no issues or complaints today. She reports improvement in wound healing. She denies signs of infection. 6/21; patient presents for follow-up. We are continuing with [facility] antibiotic and Sorbact under 3 layer compression. Patient has no complaints. Continued wound healing is happening. She denies signs of infection. 6/28; patient presents for follow-up. We have been using Keystone antibiotic with Sorbact under 3 layer compression. Usually home health comes out and changes the dressing twice a week. Unfortunately they did not go out to change the dressing. It is unclear why. Patient did not call them. She currently denies signs of infection. 7/5; patient presents for follow-up. We have been using Keystone antibiotic with calcium alginate under 3 layer compression. She reports improvement in wound healing. She denies signs of infection. Home health has come out to do dressing changes twice this past week. Laura Santiago, Laura Santiago ([id number]) [id number] Page 9 of 13 7/12; patient presents for follow-up. We have been using Keystone antibiotic with calcium alginate under 3 layer compression. Patient states that home health came out once last week to change the dressing. She reports improvement in wound healing. She currently denies signs of infection. 7/19; patient presents for follow-up. We have been using Keystone antibiotic with calcium alginate under 3 layer compression. Home health came out once last week to change the dressing. She has no issues or complaints today. She denies signs of infection. 8/2; patient presents for follow-up. We have been using Keystone antibiotic with calcium alginate under 3 layer compression. Unfortunately she missed  her appointment last week and home health did not come out to do dressing changes. Patient currently denies signs of infection. 8/9; patient presents for follow-up. We have been using Keystone with calcium alginate under 3 layer compression. She states that home health came out once last week. She currently denies signs of infection. Her wrap was completely wet. She states she was cleaning the top of the leg and water soaked down into the wrap. 8/16; patient presents for follow-up. We have been using Keystone with calcium alginate under 3 layer compression. She states that home health came out twice last week. She has no issues or complaints today. 8/23; patient presents for follow-up. He has been using Keystone with calcium alginate under 3 layer compression. Home health came out twice last week. She denies signs of infection. 8/30; patient presents for follow-up. We have been using Keystone with calcium alginate under 3 layer compression. Home health came out once last week to change the dressing. Patient reports improvement in wound healing. She states she is almost done with her chemotherapy infusions and has 1 more left. 9/13; patient presents for follow-up.  She has lost the capsules to her [facility] antibiotic which I believe is the vancomycin pills. She has her Zosyn powder today. We have been using Keystone antibiotic ointment with calcium alginate under 3 layer compression. She is concerned about systemic infection however her vitals are stable and there is no surrounding soft tissue infection. She would like to remain a patient in our wound care center however would like a second opinion for her wound care at another facility. She asked to be referred to [facility] wound care center. 9/20; patient presents for follow-up. She found her vancomycin capsules and brought in her complete Keystone antibiotic ointment set today. Unfortunately she has developed skin breakdown and Erythema to the right  lower extremity With increased swelling. She states she went to a pow wow Over the weekend and was on her feet for extended periods of time. She saw her oncologist yesterday who prescribed her doxycycline for her right lower extremity erythema. 9/27; patient presents for follow-up. We have been using Keystone antibiotic with Aquacel under 3 layer compression to the lower extremities bilaterally. When home health came and changed the wrap she secretly put coffee into the spray mix along with [facility] antibiotic on her leg thinking the acidic component would better activate the zoysn (sonething she discussed with her microbiologist brother). She has reported improvement in wound healing. 10/4; patient presents for follow-up. She has no issues or complaints today. We have been doing Aquacel and keystone under 3 layer compression to the lower extremities bilaterally. This morning she took the right lower extremity wrap off as it was uncomfortable. She has no open wounds to this leg. 10/11; patient presents for follow-up. We have been doing Aquacel with Keystone antibiotic ointment under 3 layer compression to the left lower extremity. She developed a small blister to the anterior aspect of the left leg noticed when the wrap was taken off on intake. She currently denies signs of infection. 10/18; patient presents for follow-up. We have been doing Aquacel with Keystone antibiotic ointment under 3 layer compression to the left lower extremity. There has been continued improvement in wound healing. She denies signs of infection. 10/25; patient arrives for treatment of venous insufficiency ulcers on her left lower leg both lateral and medial are remanence of apparently a circumferential wound. Much improved. We are using topicals Keystone and Aquacel Ag under 3 layer compression we continue to make good progress. The patient talk to me at some length with regards to different things she has on her forehead and  her Peri orbital area for which she is apparently applying [place]. She feels that what ever we are treating on her wounds is a more systemic problem. I really was not able to get a handle on what she is talking about however I did caution her not to put the [place] in her eyes. 11/1; her wounds continue to improve she is using Keystone and Aquacel Ag G under 3 layer compression. Our intake nurse notes erythema and edema in the right leg. The patient has a litany of concerns with regards to a rash on her forehead or ears and other systemic complaints. She has an appointment with dermatology on November 11 11/8; patient presents for follow-up. We have been using Keystone and Aquacel under 4-layer compression. She has no issues or complaints today. She reports improvement in wound healing. 11/15; patient presents for follow-up. We have been using Aquacel with Keystone antibiotic under 3 layer compression. Patient continues improvement in wound healing. 12/6; patient  presents for follow-up. We have been using Aquacel with Keystone antibiotic ointment under 3 layer compression. Wounds appear well-healing. 12/13; patient presents for follow-up. We have been using Aquacel with Keystone antibiotic under 3 layer compression. She has no issues or complaints today. 12/27 left lateral medial ankle. Superficial wounds remain there is significantly improved we are using Keystone backed with Zetuvit under 4-layer compression 1/3; patient presents for follow-up. Her wounds appear well-healing. We have been using Aquacel Ag with Keystone antibiotic ointment under compression therapy. This should be a 3 layer compression. 1/17; patient presents for follow-up. Her wounds on her left lower extremity are well-healing. We are using Aquacel Ag and Keystone antibiotic ointment under compression therapy. She missed her last clinic appointment. She states that the wrap has been changed twice weekly since she was last seen.  Unfortunately she has developed increased warmth and redness to the right lower extremity consistent with cellulitis. She states this started a few days ago. 1/24; patient presents for follow-up. Her wounds on the left lower extremity are well-healing with Aquacel and Keystone antibiotic ointment under compression therapy. I prescribed Keflex at last clinic visit for cellulitis of the right lower extremity. Unfortunately this has not resolved. She did not follow-up with her PCP for contact our office about her symptoms. 1/31; patient presents for follow-up. We have been using Aquacel and Keystone antibiotic ointment under compression therapy to the left lower extremity. Wounds appear well-healing. She has been taking clindamycin for the past week. Her symptoms have improved slightly with a decrease in erythema and warmth to the right lower extremity. She says her pain level has improved. However Symptoms have not completely resolved. She denies fever/chills, nausea/vomiting. 2/7; patient presents for follow-up. We have been using Aquacel Ag with Keystone antibiotic ointment under compression therapy to the left lower extremity. For the past week home health has not been using Keystone antibiotic ointment. She continues to take clindamycin. Her symptoms have improved greatly with the decrease in erythema and warmth to the right lower extremity. She denies systemic signs of infection. She has an area of skin breakdown to the right anterior leg. 2/14; Patient presents for follow-up. She has been using Hydrofera Blue to the right anterior leg under Tubigrip. It looks like Hydrofera Blue is also being used with Keystone to the left lower extremity under compression therapy. Order is for [place] [employer]. Overall wounds appear well healing. She has no issues or complaints. Laura Santiago, Laura Santiago ([id number]) [id number] Page 10 of 13 Objective Constitutional Vitals Time Taken: 10:30  AM, Height: 66 in, Weight: 153 lbs, BMI: 24.7, Temperature: 97.4 F, Pulse: 58 bpm, Respiratory Rate: 18 breaths/min, Blood Pressure: 137/66 mmHg. General Notes: T the left lower extremity the medial wound has healed. She still has a small open area to the lateral left leg. T the right lower extremity o o anterior aspect there is an area of skin breakdown. No increased warmth and erythema to the lower extremity. Integumentary (Hair, Skin) Wound #1 status is Healed - Epithelialized. Original cause of wound was Gradually Appeared. The date acquired was: 04/06/2021. The wound has been in treatment 81 weeks. The wound is located on the Left,Medial Lower Leg. The wound measures 0cm length x 0cm width x 0cm depth; 0cm^2 area and 0cm^3 volume. There is a none present amount of drainage noted. Wound #8 status is Open. Original cause of wound was Pressure Injury. The date acquired was: 10/18/2022. The wound has been in treatment 14 weeks. The wound is located  on the Left,Lateral Lower Leg. The wound measures 0.1cm length x 0.1cm width x 0.1cm depth; 0.008cm^2 area and 0.001cm^3 volume. There is a medium amount of serosanguineous drainage noted. Wound #9 status is Open. Original cause of wound was Gradually Appeared. The date acquired was: 01/17/2023. The wound has been in treatment 1 weeks. The wound is located on the Right,Midline Lower Leg. The wound measures 0.4cm length x 0.4cm width x 0.1cm depth; 0.126cm^2 area and 0.013cm^3 volume. There is Fat Layer (Subcutaneous Tissue) exposed. There is a medium amount of serous drainage noted. There is no granulation within the wound bed. There is a large (67-100%) amount of necrotic tissue within the wound bed including Eschar. 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 The medial aspect has epithelialized. She still has a small remaining wound to the lateral aspect. I recommended switching to [facility] and continuing Keystone antibiotic ointment here. Continue 3 layer compression. The right lower extremity wound is stable. I recommended starting to wrap this leg as well. I recommended Hydrofera Blue and Keystone antibiotic ointment here as well Under 3 layer compression. Follow-up in 1 week. Procedures Wound #8 Pre-procedure diagnosis of Wound #8 is a Venous Leg Ulcer located on the Left,Lateral Lower Leg . There was a Three Layer Compression Therapy Procedure with a pre-treatment ABI of 1.3 by [name]. Santiago procedure Diagnosis Wound #8: Same as Pre-Procedure Wound #9 Pre-procedure diagnosis of Wound #9 is a Venous Leg Ulcer located on the Right,Midline Lower Leg . There was a Three Layer Compression Therapy Procedure with a pre-treatment ABI of 1.2 by [name]. Santiago procedure Diagnosis Wound #9: Same as Pre-Procedure Plan Follow-up Appointments: Return Appointment in 1 week. Nurse Visit as needed Home Health: Laura Santiago, Laura Santiago ([id number]) [id number] Page 11 of [place]: Rocky Morel [phone] [place] for wound care. May utilize formulary equivalent dressing for wound treatment orders unless otherwise specified. Home Health Nurse may visit PRN to address patientoos wound care needs. - home health to see patient 2 times per week, patient to be seen at wound clinic once per week **Please direct any NON-WOUND related issues/requests for orders to  patient's Primary Care Physician. **If current dressing causes regression in wound condition, may D/C ordered dressing product/s and apply Normal Saline Moist Dressing daily until next [place] or Other MD appointment. **Notify [facility] of regression in wound condition at [phone]. Other Home Health Orders/Instructions: Bathing/ Shower/ Hygiene: May shower with wound dressing protected with water repellent cover or cast protector. No tub bath. Anesthetic (Use 'Patient Medications' Section for Anesthetic Order Entry): Lidocaine applied to wound bed Edema Control - Lymphedema / Segmental Compressive Device / Other: Optional: One layer of unna paste to top of compression wrap (to act as an anchor). Elevate, Exercise Daily and Avoid Standing for Long Periods of Time. Elevate legs to the level of the heart and pump ankles as often as possible Elevate leg(s) parallel to the floor when sitting. DO YOUR BEST to sleep in the bed at night. DO NOT sleep in your recliner. Long hours  of sitting in a recliner leads to swelling of the legs and/or potential wounds on your backside. Additional Orders / Instructions: Follow Nutritious Diet and Increase Protein Intake Medications-Please add to medication list.: Keystone Compound - USE ON BOTH WOUNDS WHEN CHANGING WRAP! WOUND #8: - Lower Leg Wound Laterality: Left, Lateral Cleanser: Wound Cleanser 3 x Per Week/30 Days Discharge Instructions: Wash your hands with soap and water. Remove old dressing, discard into plastic bag and place into trash. Cleanse the wound with Wound Cleanser prior to applying a clean dressing using gauze sponges, not tissues or cotton balls. Do not scrub or use excessive force. Pat dry using gauze sponges, not tissue or cotton balls. Topical: Triamcinolone Acetonide Cream, 0.1%, 15 (g) tube 3 x Per Week/30 Days Discharge Instructions: Mix 1:1 with Nystatin cream and apply to periwound and leg Prim Dressing:  Hydrofera Blue Ready Transfer Foam, 2.5x2.5 (in/in) 3 x Per Week/30 Days ary Discharge Instructions: Apply Hydrofera Blue Ready to wound bed as directed Secondary Dressing: Zetuvit Plus 4x8 (in/in) 3 x Per Week/30 Days Com pression Wrap: 3-LAYER WRAP - Profore Lite LF 3 Multilayer Compression Bandaging System 3 x Per Week/30 Days Discharge Instructions: Apply 3 multi-layer wrap as prescribed. WOUND #9: - Lower Leg Wound Laterality: Right, Midline Cleanser: Wound Cleanser 3 x Per Week/30 Days Discharge Instructions: Wash your hands with soap and water. Remove old dressing, discard into plastic bag and place into trash. Cleanse the wound with Wound Cleanser prior to applying a clean dressing using gauze sponges, not tissues or cotton balls. Do not scrub or use excessive force. Pat dry using gauze sponges, not tissue or cotton balls. Prim Dressing: Hydrofera Blue Ready Transfer Foam, 4x5 (in/in) 3 x Per Week/30 Days ary Discharge Instructions: Apply Hydrofera Blue Ready to wound bed as directed Com pression Wrap: 3-LAYER WRAP - Profore Lite LF 3 Multilayer Compression Bandaging System 3 x Per Week/30 Days Discharge Instructions: Apply 3 multi-layer wrap as prescribed. Com pression Wrap: Tubi D on Right Leg 3 x Per Week/30 Days 1. Hydrofera Blue with Keystone antibiotic under 3 layer compression to the lower extremities bilaterally 2. Follow-up in 1 week Electronic Signature(s) Signed: 01/24/2023 12:00:48 PM By: Kalman Shan DO Entered By: Kalman Shan on 01/24/2023 11:43:34 -------------------------------------------------------------------------------- ROS/PFSH Details Patient Name: Date of Service: Laura Santiago, Laura [name] 01/24/2023 10:00 A M Medical Record Number: [id number] Patient Account Number: [id number] Date of Birth/Sex: Treating RN: [birth year] (77 y.o. Laura Santiago Primary Care Provider: [place] Other Clinician: [name] Referring Provider: Treating  Provider/Extender: Kalman Shan [facility] Weeks in Treatment: 4 Information Obtained From Patient Eyes Laura Santiago, Laura Santiago [id number] ([id number]) [id number] Page 12 of 13 Medical History: Negative for: Cataracts; Glaucoma; Optic Neuritis Ear/Nose/Mouth/Throat Medical History: Negative for: Chronic sinus problems/congestion; Middle ear problems Hematologic/Lymphatic Medical History: Negative for: Anemia; Hemophilia; Human Immunodeficiency Virus; Lymphedema; Sickle Cell Disease Respiratory Medical History: Negative for: Aspiration; Asthma; Chronic Obstructive Pulmonary Disease (COPD); Pneumothorax; Sleep Apnea; Tuberculosis Cardiovascular Medical History: Positive for: Hypertension Negative for: Angina; Arrhythmia; Congestive Heart Failure; Coronary Artery Disease; Deep Vein Thrombosis; Hypotension; Myocardial Infarction; Peripheral Arterial Disease; Peripheral Venous Disease; Phlebitis; Vasculitis Gastrointestinal Medical History: Negative for: Cirrhosis ; Colitis; Crohns; Hepatitis A; Hepatitis B; Hepatitis C Endocrine Medical History: Negative for: Type I Diabetes; Type II Diabetes Genitourinary Medical History: Negative for: End Stage Renal Disease Immunological Medical History: Negative for: Lupus Erythematosus; Raynauds; Scleroderma Integumentary (Skin) Medical History: Negative for: History of Burn; History of pressure wounds Musculoskeletal Medical History:  Positive for: Osteoarthritis Negative for: Gout; Rheumatoid Arthritis; Osteomyelitis Oncologic Medical History: Positive for: Received Chemotherapy; Received Radiation Past Medical History Notes: breast cancer Immunizations Pneumococcal Vaccine: Received Pneumococcal Vaccination: No Implantable Devices None Family and Social History 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 01/24/2023 Medical Record Number: [id number] Patient Account Number: [id number] Date of Birth/Sex: Treating RN: June 07, 1946 (77 y.o. Laura Santiago Primary Care Provider: [place] Other Clinician: [name] Referring Provider: Treating Provider/Extender: [name] Weeks in Treatment: 15 Diagnosis Coding ICD-10 Codes Code Description [phone] 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 I87.311 Chronic venous hypertension (idiopathic) with ulcer of right lower extremity I87.2 Venous insufficiency (chronic) (peripheral) Z79.01 Long term (current) use of anticoagulants I10 Essential (primary) hypertension 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 Cellulitis of right lower limb S81.801A Unspecified open wound, right lower leg, initial encounter Facility Procedures : CPT4: Code [id number] Description: 81 BILATERAL: Application of multi-layer venous compression system; leg (below knee), including ankle and t. Modifier: Quantity: 1 Physician Procedures : CPT4 Code Description Modifier [id number] - WC PHYS LEVEL 3 - EST PT ICD-10 Diagnosis Description 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sed S81.801A Unspecified open wound, right lower leg, initial  encounter 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 I87.311 Chronic venous hypertension (idiopathic) with  ulcer of right lower extremity Quantity: 1 Electronic Signature(s) Signed: 01/24/2023 12:00:48 PM By: Kalman Shan DO Entered By: Kalman Shan on 01/24/2023 11:43:52

## 2023-01-27 NOTE — Progress Notes (Signed)
VANA, KARPEN ([id number]) [id number] Page 1 of 9 Visit Report for 01/24/2023 Arrival Information Details Patient Name: Date of Service: Laura Santiago. 01/24/2023 10:00 [place] Record Number: [id number] Patient Account Number: [id number] Date of Birth/Sex: Treating RN: June 27, 1946 (77 y.o. [name] Primary Care Caden Fatica: [place] Other Clinician: [name] Referring Damein Gaunce: Treating Delquan Poucher/Extender: [name] Weeks in Treatment: 80 Visit Information History Since Last Visit All ordered tests and consults were completed: No Patient Arrived: [name] Added or deleted any medications: No Arrival Time: 10:27 Any new allergies or adverse reactions: No Transfer Assistance: None Had a fall or experienced change in No Patient Requires Transmission-Based No activities of daily living that may affect Precautions: risk of falls: Patient Has Alerts: Yes Signs or symptoms of abuse/neglect since last visito No Patient Alerts: PT HAS SERVICE Hospitalized since last visit: No ANIMAL ABI 07/11/21 Implantable device outside of the clinic excluding No R) 1.16 L) 1.27 cellular tissue based products placed in the center since last visit: Has Dressing in Place as Prescribed: Yes Has Compression in Place as Prescribed: Yes Pain Present Now: No Electronic Signature(s) Signed: 01/25/2023 11:57:56 AM By: [name] Entered By: [name] on 01/24/2023 10:29:54 -------------------------------------------------------------------------------- Clinic Level of Care Assessment Details Patient Name: Date of Service: Laura Santiago. 01/24/2023 10:00 A M Medical Record Number: [id number] Patient Account Number: [id number] Date of Birth/Sex: Treating RN: 01-13-1946 (77 y.o. [name] Primary Care Andraya Frigon: [place] Other Clinician: [name] Referring Boden Stucky: Treating  Shatira Dobosz/Extender: [name] Weeks in Treatment: 58 Clinic Level of Care Assessment Items TOOL 1 Quantity Score []$  - 0 Use when EandM and Procedure is performed on INITIAL visit ASSESSMENTS - Nursing Assessment / Reassessment []$  - 0 General Physical Exam (combine w/ comprehensive assessment (listed just below) when performed on new pt. [address], [address] ([id number]) [id number] Page 2 of 9 []$  - 0 Comprehensive Assessment (HX, ROS, Risk Assessments, Wounds Hx, etc.) ASSESSMENTS - Wound and Skin Assessment / Reassessment []$  - 0 Dermatologic / Skin Assessment (not related to wound area) ASSESSMENTS - Ostomy and/or Continence Assessment and Care []$  - 0 Incontinence Assessment and Management []$  - 0 Ostomy Care Assessment and Management (repouching, etc.) PROCESS - Coordination of Care []$  - 0 Simple Patient / Family Education for ongoing care []$  - 0 Complex (extensive) Patient / Family Education for ongoing care []$  - 0 Staff obtains Programmer, systems, Records, T Results / Process Orders est []$  - 0 Staff telephones HHA, Nursing Homes / Clarify orders / etc []$  - 0 Routine Transfer to another Facility (non-emergent condition) []$  - 0 Routine Hospital Admission (non-emergent condition) []$  - 0 New Admissions / Biomedical engineer / Ordering NPWT Apligraf, etc. , []$  - 0 Emergency Hospital Admission (emergent condition) PROCESS - Special Needs []$  - 0 Pediatric / Minor Patient Management []$  - 0 Isolation Patient Management []$  - 0 Hearing / Language / Visual special needs []$  - 0 Assessment of Community assistance (transportation, D/C planning, etc.) []$  - 0 Additional assistance / Altered mentation []$  - 0 Support Surface(s) Assessment (bed, cushion, seat, etc.) INTERVENTIONS - Miscellaneous []$  - 0 External ear exam []$  - 0 Patient Transfer (multiple staff / Civil Service fast streamer / Similar devices) []$  - 0 Simple Staple / Suture  removal (25 or less) []$  - 0 Complex Staple / Suture removal (26 or more) []$  - 0 Hypo/Hyperglycemic Management (do not check  if billed separately) []$  - 0 Ankle / Brachial Index (ABI) - do not check if billed separately Has the patient been seen at the hospital within the last three years: Yes Total Score: 0 Level Of Care: ____ Electronic Signature(s) Signed: 01/25/2023 11:57:56 AM By: [name] Entered By: [name] on 01/24/2023 11:09:14 -------------------------------------------------------------------------------- Compression Therapy Details Patient Name: Date of Service: [name] 01/24/2023 10:00 [place] Record Number: [id number] Patient Account Number: [id number] Date of Birth/Sex: Treating RN: 1946-02-12 (77 y.o. [name] Primary Care Catrina Fellenz: [place] Other Clinician: Wesleigh, Burkett ([id number]) [id number] Page 3 of 9 Referring Ermin Parisien: Treating Kimberli Winne/Extender: [name] Weeks in Treatment: 86 Compression Therapy Performed for Wound Assessment: Wound #8 Left,Lateral Lower Leg Performed By: [name], [name], Compression Type: Three Layer Pre Treatment ABI: 1.3 Post Procedure Diagnosis Same as Pre-procedure Electronic Signature(s) Signed: 01/25/2023 11:57:56 AM By: [name] Entered By: [name] on 01/24/2023 11:06:03 -------------------------------------------------------------------------------- Compression Therapy Details Patient Name: Date of Service: [name] 01/24/2023 10:00 A M Medical Record Number: [id number] Patient Account Number: [id number] Date of Birth/Sex: Treating RN: [birth year] (77 y.o. [name] Primary Care Merel Santoli: [place] Other Clinician: [name] Referring Dorette Hartel: Treating Montrail Mehrer/Extender: Kalman Shan [facility] Weeks in Treatment: 8 Compression Therapy  Performed for Wound Assessment: Wound #9 Right,Midline Lower Leg Performed By: [name], [name], Compression Type: Three Layer Pre Treatment ABI: 1.2 Post Procedure Diagnosis Same as Pre-procedure Electronic Signature(s) Signed: 01/25/2023 11:57:56 AM By: [name] Entered By: [name] on 01/24/2023 11:06:34 -------------------------------------------------------------------------------- Lower Extremity Assessment Details Patient Name: Date of Service: Laura Santiago. 01/24/2023 10:00 A M Medical Record Number: [id number] Patient Account Number: [id number] Date of Birth/Sex: Treating RN: [birth year] (77 y.o. [name] Primary Care Jamillah Camilo: [place] Other Clinician: [name] Referring Venda Dice: Treating Randell Detter/Extender: Kalman Shan [facility] [place] in Treatment: [address] Edema Assessment L[LeftNANCE, TUBMAN [id number] [Right: [phone].pdf Page 4 of 9] Assessed: [Left: Yes] [Right: Yes] Edema: [Left: Yes] [Right: Yes] Calf Left: Right: Point of Measurement: 35 cm From Medial Instep 36.2 cm 39.2 cm Ankle Left: Right: Point of Measurement: 12 cm From Medial Instep 22.2 cm 23.4 cm Vascular Assessment Pulses: Dorsalis Pedis Palpable: [Left:Yes] [Right:Yes] Electronic Signature(s) Signed: 01/25/2023 11:57:56 AM By: [name] Signed: 01/26/2023 1:08:07 PM By: Gretta Cool, BSN, RN, CWS, [name] RN, BSN Entered By: [name] on 01/24/2023 10:51:39 -------------------------------------------------------------------------------- Multi Wound Chart Details Patient Name: Date of Service: [name], [name] [name] 01/24/2023 10:00 A M Medical Record Number: [id number] Patient Account Number: [id number] Date of Birth/Sex: Treating RN: [birth year] (77 y.o. [name] Primary Care Rye Decoste: [place] Other Clinician: [name] Referring Berlyn Malina: Treating Kamaria Lucia/Extender: [name] Weeks in Treatment: 52 Vital Signs Height(in): 76 Pulse(bpm): 33 Weight(lbs): 153 Blood Pressure(mmHg): 137/66 Body Mass Index(BMI): 24.7 Temperature(F): 97.4 Respiratory Rate(breaths/min): 18 [1:Photos:] Left, Medial Lower Leg Left, Lateral Lower Leg Right, Midline Lower Leg Wound Location: Gradually Appeared Pressure Injury Gradually Appeared Wounding Event: Venous Leg Ulcer Venous Leg Ulcer Venous Leg Ulcer Primary Etiology: Hypertension, Osteoarthritis, ReceivedHypertension, Osteoarthritis, ReceivedHypertension, Osteoarthritis, Received Comorbid History: Chemotherapy, Received Radiation Chemotherapy, Received Radiation Chemotherapy, Received Radiation 04/06/2021 10/18/2022 01/17/2023 Date Acquired: 66 14 1 Weeks of Treatment: Healed - Epithelialized Open Open Wound Status: No No No Wound Recurrence: Yes No Yes Clustered Wound: N/A N/A 3  Clustered Quantity: LESLYN, RUHL ([id number]) [id number] Page 5 of 9 0x0x0 0.1x0.1x0.1 0.4x0.4x0.1 Measurements L x W x D (cm) 0 0.008 0.126 A (cm) : rea 0 0.001 0.013 Volume (cm) : 100.00% 99.80% 96.20% % Reduction in Area: 100.00% 99.90% 96.10% % Reduction in Volume: Full Thickness Without Exposed Full Thickness Without Exposed Partial Thickness Classification: Support Structures Support Structures None Present Medium Medium Exudate A mount: N/A Serosanguineous Serous Exudate Type: N/A red, brown amber Exudate Color: N/A N/A None Present (0%) Granulation A mount: N/A N/A Large (67-100%) Necrotic A mount: N/A N/A Eschar Necrotic Tissue: Large (67-100%) N/A None Epithelialization: Treatment Notes Electronic Signature(s) Signed: 01/25/2023 11:57:56 AM By: [name] Entered By: [name] on 01/24/2023 11:05:33 -------------------------------------------------------------------------------- Pain Assessment Details Patient Name: Date of  Service: [name] 01/24/2023 10:00 [place] Record Number: [id number] Patient Account Number: [id number] Date of Birth/Sex: Treating RN: [birth year] (77 y.o. [name] Primary Care Ryheem Jay: [place] Other Clinician: [name] Referring Elyn Krogh: Treating Tatiyanna Lashley/Extender: [name] Weeks in Treatment: 82 Active Problems Location of Pain Severity and Description of Pain Patient Has Paino No Site Locations Pain Management and Medication Current Pain Management: Electronic Signature(s) Signed: 01/25/2023 11:57:56 AM By: [name] Signed: 01/26/2023 1:08:07 PM By: Gretta Cool, BSN, RN, CWS, [name] RN, BSN Entered By: [name] on 01/24/2023 10:47:28 [name] ([id number]) [phone].pdf Page 6 of 9 -------------------------------------------------------------------------------- Wound Assessment Details Patient Name: Date of Service: Laura Santiago. 01/24/2023 10:00 A M Medical Record Number: [id number] Patient Account Number: [id number] Date of Birth/Sex: Treating RN: 05-Jan-1946 (77 y.o. [name] Primary Care Zelma Snead: [place] Other Clinician: [name] Referring Murriel Eidem: Treating Yanil Dawe/Extender: [name] Weeks in Treatment: 68 Wound Status Wound Number: 1 Primary Venous Leg Ulcer Etiology: Wound Location: Left, Medial Lower Leg Wound Status: Healed - Epithelialized Wounding Event: Gradually Appeared Comorbid Hypertension, Osteoarthritis, Received Chemotherapy, Date Acquired: 04/06/2021 History: Received Radiation Weeks Of Treatment: 81 Clustered Wound: Yes Photos Wound Measurements Length: (cm) Width: (cm) Depth: (cm) Area: (cm) Volume: (cm) 0 % Reduction in Area: 100% 0 % Reduction in Volume: 100% 0 Epithelialization: Large (67-100%) 0 0 Wound Description Classification: Full Thickness Without Exposed  Support Exudate Amount: None Present Structures Electronic Signature(s) Signed: 01/25/2023 11:57:56 AM By: [name] Signed: 01/26/2023 1:08:07 PM By: Gretta Cool, BSN, RN, CWS, [name] RN, BSN Entered By: [name] on 01/24/2023 11:04:00 [name] ([id number]) [id number].pdf Page 7 of 9 -------------------------------------------------------------------------------- Wound Assessment Details Patient Name: Date of Service: Laura Santiago. 01/24/2023 10:00 A M Medical Record Number: [id number] Patient Account Number: [id number] Date of Birth/Sex: Treating RN: 1946/07/01 (77 y.o. [name] Primary Care Barry Faircloth: [place] Other Clinician: [name] Referring Sabastian Raimondi: Treating Shaunae Sieloff/Extender: [name] Weeks in Treatment: 96 Wound Status Wound Number: 8 Primary Venous Leg Ulcer Etiology: Wound Location: Left, Lateral Lower Leg Wound Status: Open Wounding Event: Pressure Injury Comorbid Hypertension, Osteoarthritis, Received Chemotherapy, Date Acquired: 10/18/2022 History: Received Radiation Weeks Of Treatment: 14 Clustered Wound: No Photos Wound Measurements Length: (cm) 0.1 Width: (cm) 0.1 Depth: (cm) 0.1 Area: (cm) 0.008 Volume: (cm) 0.001 % Reduction in Area: 99.8% % Reduction in Volume: 99.9% Wound Description Classification: Full Thickness Without Exposed Support Exudate Amount: Medium Exudate Type: Serosanguineous Exudate Color: red, brown Structures Electronic Signature(s) Signed: 01/25/2023 11:57:56 AM By: [name] Signed: 01/26/2023 1:08:07 PM By: Gretta Cool, BSN,  RN, CWS, [name] RN, BSN Entered By: [name] on 01/24/2023 10:49:02 [name] ([id number]) [id number] Page 8 of 9 -------------------------------------------------------------------------------- Wound Assessment Details Patient Name: Date of Service: Laura Santiago.  01/24/2023 10:00 A M Medical Record Number: [id number] Patient Account Number: [id number] Date of Birth/Sex: Treating RN: 20-Dec-1945 (77 y.o. [name] Primary Care Jaquil Todt: [place] Other Clinician: [name] Referring Jeremian Whitby: Treating Ayza Ripoll/Extender: [name] Weeks in Treatment: 37 Wound Status Wound Number: 9 Primary Venous Leg Ulcer Etiology: Wound Location: Right, Midline Lower Leg Wound Status: Open Wounding Event: Gradually Appeared Comorbid Hypertension, Osteoarthritis, Received Chemotherapy, Date Acquired: 01/17/2023 History: Received Radiation Weeks Of Treatment: 1 Clustered Wound: Yes Photos Wound Measurements Length: (cm) 0.4 Width: (cm) 0.4 Depth: (cm) 0.1 Clustered Quantity: 3 Area: (cm) 0.126 Volume: (cm) 0.013 % Reduction in Area: 96.2% % Reduction in Volume: 96.1% Epithelialization: None Wound Description Classification: Partial Thickness Exudate Amount: Medium Exudate Type: Serous Exudate Color: amber Foul Odor After Cleansing: No Slough/Fibrino Yes Wound Bed Granulation Amount: None Present (0%) Exposed Structure Necrotic Amount: Large (67-100%) Fascia Exposed: No Necrotic Quality: Eschar Fat Layer (Subcutaneous Tissue) Exposed: Yes Tendon Exposed: No Muscle Exposed: No Joint Exposed: No Bone Exposed: No Electronic Signature(s) Signed: 01/25/2023 11:57:56 AM By: [name] Signed: 01/26/2023 1:08:07 PM By: Gretta Cool, BSN, RN, CWS, [name] RN, BSN Entered By: [name] on 01/24/2023 10:49:51 [name] ([id number]) [id number] Page 9 of 9 -------------------------------------------------------------------------------- Vitals Details Patient Name: Date of Service: Laura Santiago. 01/24/2023 10:00 A M Medical Record Number: [id number] Patient Account Number: [id number] Date of Birth/Sex: Treating RN: Jun 13, 1946 (77 y.o. [name] Primary Care  Danecia Underdown: [place] Other Clinician: [name] Referring Ranetta Armacost: Treating Loxley Cibrian/Extender: [name] Weeks in Treatment: 63 Vital Signs Time Taken: 10:30 Temperature (F): 97.4 Height (in): 66 Pulse (bpm): 58 Weight (lbs): 153 Respiratory Rate (breaths/min): 18 Body Mass Index (BMI): 24.7 Blood Pressure (mmHg): 137/66 Reference Range: 80 - 120 mg / dl Electronic Signature(s) Signed: 01/25/2023 11:57:56 AM By: [name] Entered By: [name] on 01/24/2023 10:34:32

## 2023-01-31 ENCOUNTER — Encounter (HOSPITAL_BASED_OUTPATIENT_CLINIC_OR_DEPARTMENT_OTHER): Payer: Medicare Other | Admitting: Internal Medicine

## 2023-01-31 ENCOUNTER — Encounter: Payer: Self-pay | Admitting: Oncology

## 2023-01-31 DIAGNOSIS — I87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: Secondary | ICD-10-CM | POA: Diagnosis not present

## 2023-01-31 DIAGNOSIS — I87311 Chronic venous hypertension (idiopathic) with ulcer of right lower extremity: Secondary | ICD-10-CM | POA: Diagnosis not present

## 2023-01-31 DIAGNOSIS — L97822 Non-pressure chronic ulcer of other part of left lower leg with fat layer exposed: Secondary | ICD-10-CM | POA: Diagnosis not present

## 2023-02-01 ENCOUNTER — Other Ambulatory Visit: Payer: Self-pay | Admitting: Hospice and Palliative Medicine

## 2023-02-02 MED ORDER — FENTANYL 25 MCG/HR TD PT72: MEDICATED_PATCH | TRANSDERMAL | 0 refills | Status: DC

## 2023-02-02 NOTE — Progress Notes (Signed)
Laura Santiago, Laura Santiago ([id number]) [id number] Page 1 of 13 Visit Report for 01/31/2023 Chief Complaint Document Details Patient Name: Date of Service: Laura Ace. 01/31/2023 10:00 [place] Record Number: [id number] Patient Account Number: [id number] Date of Birth/Sex: Treating RN: 11-23-1946 (77 y.o. Laura Santiago Primary Care Provider: [place] Other Santiago: Laura Santiago Referring Provider: Treating Provider/Extender: Laura Santiago [facility] Weeks in Treatment: 48 Information Obtained from: Patient Chief Complaint Left lower extremity wound Right toe wounds Left upper lateral thigh wounds Electronic Signature(s) Signed: 01/31/2023 11:08:41 AM By: Kalman Shan DO Entered By: Kalman Shan on 01/31/2023 10:55:19 -------------------------------------------------------------------------------- HPI Details Patient Name: Date of Service: Laura Santiago [name] 01/31/2023 10:00 A M Medical Record Number: [id number] Patient Account Number: [id number] Date of Birth/Sex: Treating RN: [birth year] (77 y.o. Laura Santiago Primary Care Provider: [place] Other Santiago: Laura Santiago Referring Provider: Treating Provider/Extender: Kalman Shan [facility] Weeks in Treatment: 63 History of Present Illness HPI Description: Admission 7/27 Laura Santiago is a 77 year old female with a past medical history of ADHD, metastatic breast cancer, stage IV chronic kidney disease, history of DVT on Xarelto and chronic venous insufficiency that presents to the clinic for a chronic left lower extremity wound. She recently moved to [facility] 4 days ago. She was being followed by wound care center in [name]. She reports a 10-year history of wounds to her left lower extremity that eventually do heal with debridement and compression therapy. She states that the current wound reopened 4 months ago  and she is using Vaseline and Coban. She denies signs of infection. 8/3; patient presents for 1 week follow-up. She reports no issues or complaints today. She states she had vascular studies done in the last week. She denies signs of infection. She brought her little service dog with her today. 8/17; patient presents for follow-up. She has missed her last clinic appointment. She states she took the wrap off and attempted to rewrap her leg. She is having difficulty with transportation. She has her service dog with her today. Overall she feels well and reports improvement in wound healing. She denies signs of infection. She reports owning an old Velcro wrap compression and has this at her living facility 9/14; patient presents for follow-up. Patient states that over the past 2 to 3 weeks she developed toe wounds to her right foot. She attributes this to tight fitting Laura Santiago, Laura Santiago ([id number]) [id number] Page 2 of 13 shoes. She subsequently developed cellulitis in the right leg and has been treated by doxycycline by her oncologist. She reports improvement in symptoms however continues to have some redness and swelling to this leg. T the left lower extremity patient has been having her wraps changed with home health twice weekly. She states that the [facility] is not helping control o the drainage. Other than that she has no issues or complaints today. She denies signs of infection to the left lower extremity. 9/21; patient presents for follow-up. She reports seeing infectious disease for her cellulitis. She reports no further management. She has home health that changes the wraps twice weekly. She has no issues or complaints today. She denies signs of infection. 10/5; patient presents for follow-up. She has no issues or complaints today. She denies signs of infection. She states that the right great toe has not been dressed by home health. 10/12; patient presents  for follow-up. She has no issues or complaints today.  She reports improvement in her wound healing. She has been using silver alginate to the right great toe wound. She denies signs of infection. 10/26; patient presents for follow-up. Home health did not have sorbact so they continued to use Hydrofera Blue under the wrap. She has been using silver alginate to the great toe wound however she did not have a dressing in place today. She currently denies signs of infection. 11/2; patient presents for follow-up. She has been using sorb act under the compression wrap. She reports using silver alginate to the toe wound again she does not have a dressing in place. She currently denies signs of infection. 11/23; patient presents for follow-up. Unfortunately she has missed her last 2 clinic appointments. She was last seen 3 weeks ago. She did her own compression wrap with Kerlix and Coban yesterday after seeing vein and vascular. She has not been dressing her right great toe wound. She currently denies signs of infection. 11/30; patient presents for 1 week follow-up. She states she changed her dressing last week prior to home health and use sorb act with Dakin's and Hydrofera Blue. Home health has changed the dressing as well and they have been using sorbact. T oday she reports increased redness to her right lower extremity. She has a history of cellulitis to this leg. She has been using silver alginate to the right great toe. Unfortunately she had an episode of diarrhea prior to coming in and had feces all over the right leg and to the wrap of her left leg. 12/7; patient presents for 1 week follow-up. She states that home health did not come out to change the dressing and she took it off yesterday. It is unclear if she is dressing the right toe wound. She denies signs of infection. 12/14; patient presents for 1 week follow-up. She has no issues or complaints today. 12/21; patient presents for follow-up. She  has no issues or complaints today. She denies signs of infection. 12/28/2021; patient presents for follow-up. She was hospitalized for sepsis secondary to right lower extremity cellulitis On 12/23. She states she is currently at a SNF. She states that she was started on doxycycline this morning for her right great toe swelling and redness. She is not sure what dressings have been done to her left lower extremity for the past 3 weeks. She says its been mainly gauze with an Ace wrap. 1/25; patient presents for follow-up. She is still residing in a skilled nursing facility. She reports mild pain to the left lower extremity wound bed. She states she is going to see a podiatrist soon. 2/8; patient presents for follow-up. She has moved back to her residential community from her skilled nursing facility. She has no issues or complaints today. She denies signs of systemic infections. 2/15; patient presents for follow-up. He has no issues or complaints today. She denies systemic signs of infection. 2/22; patient presents for follow-up. She has no issues or complaints today. She denies signs of infection. 3/1; patient presents for follow-up. She states that home health came out the day after she was seen in our clinic and yesterday to do the wrap change. She denies signs of infection. She reports excoriated skin on the ankle. 3/8; patient presents for follow-up. She has no issues or complaints today. She denies signs of infection. 3/15; patient presents for follow-up. Home health has been coming out to change the dressings. She reports more tenderness to the wound site. She denies purulent drainage, increased warmth or erythema  to the area. 4/5; patient presents for follow-up. She has missed her last 2 clinic appointments. I have not seen her in 3 weeks. She was recently hospitalized for altered mental status. She was involuntarily committed. She was evaluated by psychiatry and deemed to have competency. There  was no specific cause of her altered mental status. It was concluded that her physical and mental health were declining due to her chronic medical conditions. Currently home health has been coming out for dressing changes. Patient has also been doing her own dressing changes. She reports more skin breakdown to the periwound and now has a new wound. She denies fever/chills. She reports continued tenderness to the wound site. 4/12; patient with significant venous insufficiency and a large wound on her left lower leg taking up about 80% of the circumference of her lower leg. Cultures of this grew MRSA and Pseudomonas. She had completed a course of ciprofloxacin now is starting doxycycline. She has been using Dakin's wet-to-dry and a Tubigrip. She has home health twice a week and we change it once. 4/19; patient presents for follow-up. She completed her course of doxycycline. She has been using Dakin's wet-to-dry dressing and Tubigrip. Home health changes the dressing twice weekly. Currently she has no issues or complaints. 4/26; patient presents for follow-up. At last clinic visit orders for home health were Iodosorb under compression therapy. Unfortunately they did not have the dressing and have been using Dakin's and gentamicin under the wrap. Patient currently denies signs of infection. She has no issues or complaints today. 5/3; patient presents for follow-up. Again Iodosorb has not been used under the compression therapy when home health comes out to change the wrap and dressing. They have been using Sorbact. It is unclear why this is happening since we send orders weekly to the agency. She denies signs of infection. Patient has not purchased the [place] antibiotics. We reached out to the company and they said they have been trying to contact her on a regular basis. We gave the patient the number to call to order the medication. 5/10; patient presents for follow-up. She has no issues or complaints  today. Again home health has not been using Iodosorb. Mepilex was on the wound bed. No other dressings noted. She brought in her Keystone antibiotics. She denies signs of infection. 5/17; patient presents for follow-up. Home health has come out twice since she was last seen. Joint well she has been using Keystone antibiotic with Sorbact under the compression wrap. She has no issues or complaints today. She denies signs of infection. 5/24; patient presents for follow-up. We have been using Keystone antibiotics with Sorbact under compression therapy. She is tolerating the treatment well. She is reporting improvement in wound healing. She denies signs of infection. 5/31; patient presents for follow-up. We continue to do [facility] antibiotics with Sorbact under compression therapy. She continues to report improvement in wound healing. Home health comes out and changes the dressing once weekly. 05-17-2022 upon evaluation today patient appears to be doing better in regard to her wound especially compared to the last time I saw her. Fortunately I do think Laura Santiago, Laura Santiago ([id number]) [id number] Page 3 of 13 that she is seeing improvements. With that being said I do believe that she may be benefit from sharp debridement today to clear away some of the necrotic debris I discussed that with her as well. She is an amendable to that plan. Otherwise she is very pleased with how the [employer] is doing for her.  6/14; patient presents for follow-up. We have been using Keystone antibiotic with Sorbact and absorbent dressings under 3 layer compression. She has no issues or complaints today. She reports improvement in wound healing. She denies signs of infection. 6/21; patient presents for follow-up. We are continuing with [facility] antibiotic and Sorbact under 3 layer compression. Patient has no complaints. Continued wound healing is happening. She denies signs of infection. 6/28; patient  presents for follow-up. We have been using Keystone antibiotic with Sorbact under 3 layer compression. Usually home health comes out and changes the dressing twice a week. Unfortunately they did not go out to change the dressing. It is unclear why. Patient did not call them. She currently denies signs of infection. 7/5; patient presents for follow-up. We have been using Keystone antibiotic with calcium alginate under 3 layer compression. She reports improvement in wound healing. She denies signs of infection. Home health has come out to do dressing changes twice this past week. 7/12; patient presents for follow-up. We have been using Keystone antibiotic with calcium alginate under 3 layer compression. Patient states that home health came out once last week to change the dressing. She reports improvement in wound healing. She currently denies signs of infection. 7/19; patient presents for follow-up. We have been using Keystone antibiotic with calcium alginate under 3 layer compression. Home health came out once last week to change the dressing. She has no issues or complaints today. She denies signs of infection. 8/2; patient presents for follow-up. We have been using Keystone antibiotic with calcium alginate under 3 layer compression. Unfortunately she missed her appointment last week and home health did not come out to do dressing changes. Patient currently denies signs of infection. 8/9; patient presents for follow-up. We have been using Keystone with calcium alginate under 3 layer compression. She states that home health came out once last week. She currently denies signs of infection. Her wrap was completely wet. She states she was cleaning the top of the leg and water soaked down into the wrap. 8/16; patient presents for follow-up. We have been using Keystone with calcium alginate under 3 layer compression. She states that home health came out twice last week. She has no issues or complaints  today. 8/23; patient presents for follow-up. He has been using Keystone with calcium alginate under 3 layer compression. Home health came out twice last week. She denies signs of infection. 8/30; patient presents for follow-up. We have been using Keystone with calcium alginate under 3 layer compression. Home health came out once last week to change the dressing. Patient reports improvement in wound healing. She states she is almost done with her chemotherapy infusions and has 1 more left. 9/13; patient presents for follow-up. She has lost the capsules to her [facility] antibiotic which I believe is the vancomycin pills. She has her Zosyn powder today. We have been using Keystone antibiotic ointment with calcium alginate under 3 layer compression. She is concerned about systemic infection however her vitals are stable and there is no surrounding soft tissue infection. She would like to remain a patient in our wound care center however would like a second opinion for her wound care at another facility. She asked to be referred to [facility] wound care center. 9/20; patient presents for follow-up. She found her vancomycin capsules and brought in her complete Keystone antibiotic ointment set today. Unfortunately she has developed skin breakdown and Erythema to the right lower extremity With increased swelling. She states she went to a [employer]  Over the weekend and was on her feet for extended periods of time. She saw her oncologist yesterday who prescribed her doxycycline for her right lower extremity erythema. 9/27; patient presents for follow-up. We have been using Keystone antibiotic with Aquacel under 3 layer compression to the lower extremities bilaterally. When home health came and changed the wrap she secretly put coffee into the spray mix along with [facility] antibiotic on her leg thinking the acidic component would better activate the zoysn (sonething she discussed with her microbiologist brother). She has  reported improvement in wound healing. 10/4; patient presents for follow-up. She has no issues or complaints today. We have been doing Aquacel and keystone under 3 layer compression to the lower extremities bilaterally. This morning she took the right lower extremity wrap off as it was uncomfortable. She has no open wounds to this leg. 10/11; patient presents for follow-up. We have been doing Aquacel with Keystone antibiotic ointment under 3 layer compression to the left lower extremity. She developed a small blister to the anterior aspect of the left leg noticed when the wrap was taken off on intake. She currently denies signs of infection. 10/18; patient presents for follow-up. We have been doing Aquacel with Keystone antibiotic ointment under 3 layer compression to the left lower extremity. There has been continued improvement in wound healing. She denies signs of infection. 10/25; patient arrives for treatment of venous insufficiency ulcers on her left lower leg both lateral and medial are remanence of apparently a circumferential wound. Much improved. We are using topicals Keystone and Aquacel Ag under 3 layer compression we continue to make good progress. The patient talk to me at some length with regards to different things she has on her forehead and her Peri orbital area for which she is apparently applying [place]. She feels that what ever we are treating on her wounds is a more systemic problem. I really was not able to get a handle on what she is talking about however I did caution her not to put the [place] in her eyes. 11/1; her wounds continue to improve she is using Keystone and Aquacel Ag G under 3 layer compression. Our intake nurse notes erythema and edema in the right leg. The patient has a litany of concerns with regards to a rash on her forehead or ears and other systemic complaints. She has an appointment with dermatology on November 11 11/8; patient presents for follow-up. We  have been using Keystone and Aquacel under 4-layer compression. She has no issues or complaints today. She reports improvement in wound healing. 11/15; patient presents for follow-up. We have been using Aquacel with Keystone antibiotic under 3 layer compression. Patient continues improvement in wound healing. 12/6; patient presents for follow-up. We have been using Aquacel with Keystone antibiotic ointment under 3 layer compression. Wounds appear well-healing. 12/13; patient presents for follow-up. We have been using Aquacel with Keystone antibiotic under 3 layer compression. She has no issues or complaints today. 12/27 left lateral medial ankle. Superficial wounds remain there is significantly improved we are using Keystone backed with Zetuvit under 4-layer compression 1/3; patient presents for follow-up. Her wounds appear well-healing. We have been using Aquacel Ag with Keystone antibiotic ointment under compression therapy. This should be a 3 layer compression. 1/17; patient presents for follow-up. Her wounds on her left lower extremity are well-healing. We are using Aquacel Ag and Keystone antibiotic ointment under compression therapy. She missed her last clinic appointment. She states that the wrap has been changed  twice weekly since she was last seen. Unfortunately she has developed increased warmth and redness to the right lower extremity consistent with cellulitis. She states this started a few days ago. 1/24; patient presents for follow-up. Her wounds on the left lower extremity are well-healing with Aquacel and Keystone antibiotic ointment under compression therapy. I prescribed Keflex at last clinic visit for cellulitis of the right lower extremity. Unfortunately this has not resolved. She did not follow-up with her PCP PAULEEN, DANTON ([id number]) [id number] Page 4 of 13 for contact our office about her symptoms. 1/31; patient presents for follow-up. We have  been using Aquacel and Keystone antibiotic ointment under compression therapy to the left lower extremity. Wounds appear well-healing. She has been taking clindamycin for the past week. Her symptoms have improved slightly with a decrease in erythema and warmth to the right lower extremity. She says her pain level has improved. However Symptoms have not completely resolved. She denies fever/chills, nausea/vomiting. 2/7; patient presents for follow-up. We have been using Aquacel Ag with Keystone antibiotic ointment under compression therapy to the left lower extremity. For the past week home health has not been using Keystone antibiotic ointment. She continues to take clindamycin. Her symptoms have improved greatly with the decrease in erythema and warmth to the right lower extremity. She denies systemic signs of infection. She has an area of skin breakdown to the right anterior leg. 2/14; Patient presents for follow-up. She has been using Hydrofera Blue to the right anterior leg under Tubigrip. It looks like Hydrofera Blue is also being used with Keystone to the left lower extremity under compression therapy. Order is for [place]. Overall wounds appear well healing. She has no issues or complaints. 2/21; patient presents for follow-up. We have been using Hydrofera Blue under 3 layer compression to the lower extremities bilaterally. Her right lower extremity wounds have healed. She has a small open wound remaining to her left lateral leg. Electronic Signature(s) Signed: 01/31/2023 11:08:41 AM By: Kalman Shan DO Entered By: Kalman Shan on 01/31/2023 10:58:08 -------------------------------------------------------------------------------- Physical Exam Details Patient Name: Date of Service: Laura Santiago [name] 01/31/2023 10:00 A M Medical Record Number: [id number] Patient Account Number: [id number] Date of Birth/Sex: Treating RN: 04-Sep-1946 (77 y.o. Laura Santiago Primary Care Provider:  [place] Other Santiago: Laura Santiago Referring Provider: Treating Provider/Extender: Kalman Shan [facility] Weeks in Treatment: 40 Constitutional . Cardiovascular . Psychiatric . Notes Small open area to the lateral left leg With granulation tissue. T the right lower extremity there are no open wounds. No increased warmth and erythema to the o lower extremity. Electronic Signature(s) Signed: 01/31/2023 11:08:41 AM By: Kalman Shan DO Entered By: Kalman Shan on 01/31/2023 10:59:54 Laura Santiago ([id number]) [id number] Page 5 of 13 -------------------------------------------------------------------------------- Physician Orders Details Patient Name: Date of Service: Laura Ace. 01/31/2023 10:00 [place] Record Number: [id number] Patient Account Number: [id number] Date of Birth/Sex: Treating RN: [birth year] (77 y.o. Laura Santiago Primary Care Provider: [place] Other Santiago: Laura Santiago Referring Provider: Treating Provider/Extender: Laura Santiago [facility] Weeks in Treatment: 62 Verbal / Phone Orders: No Diagnosis Coding Follow-up Appointments Return Appointment in 1 week. Nurse Visit as needed [place]: Rocky Morel [phone] [place] for wound care. May utilize formulary equivalent dressing for wound treatment orders unless otherwise specified. Home Health Nurse may visit PRN to address patients wound care needs. - home health to see  patient 2 times per week, patient to be seen at wound clinic once per week **Please direct any NON-WOUND related issues/requests for orders to patient's Primary Care Physician. **If current dressing causes regression in wound condition, may D/C ordered dressing product/s and apply Normal Saline Moist Dressing daily until next [place] or Other MD appointment. **Notify Wound Healing  Center of regression in wound condition at [phone]. Other Home Health Orders/Instructions: Bathing/ Shower/ Hygiene May shower with wound dressing protected with water repellent cover or cast protector. No tub bath. Anesthetic (Use 'Patient Medications' Section for Anesthetic Order Entry) Lidocaine applied to wound bed Edema Control - Lymphedema / Segmental Compressive Device / Other Optional: One layer of unna paste to top of compression wrap (to act as an anchor). Tubigrip single layer applied. - Tubi D- Right lower leg Elevate, Exercise Daily and A void Standing for Long Periods of Time. Elevate legs to the level of the heart and pump ankles as often as possible Elevate leg(s) parallel to the floor when sitting. DO YOUR BEST to sleep in the bed at night. DO NOT sleep in your recliner. Long hours of sitting in a recliner leads to swelling of the legs and/or potential wounds on your backside. Additional Orders / Instructions Follow Nutritious Diet and Increase Protein Intake Medications-Please add to medication list. Keystone Compound - USE ON BOTH WOUNDS WHEN CHANGING WRAP! Wound Treatment Wound #8 - Lower Leg Wound Laterality: Left, Lateral Cleanser: Wound Cleanser 3 x Per Week/30 Days Discharge Instructions: Wash your hands with soap and water. Remove old dressing, discard into plastic bag and place into trash. Cleanse the wound with Wound Cleanser prior to applying a clean dressing using gauze sponges, not tissues or cotton balls. Do not scrub or use excessive force. Pat dry using gauze sponges, not tissue or cotton balls. Topical: Triamcinolone Acetonide Cream, 0.1%, 15 (g) tube 3 x Per Week/30 Days Discharge Instructions: Mix 1:1 with Nystatin cream and apply to periwound and leg Prim Dressing: AquacelAg Advantage Dressing, 2X2 (in/in) 3 x Per Week/30 Days ary Discharge Instructions: Apply to wound as directed Secondary Dressing: ABD Pad 5x9 (in/in) 3 x Per Week/30  Days Discharge Instructions: Cover with ABD pad Compression Wrap: 3-LAYER WRAP - Profore Lite LF 3 Multilayer Compression Bandaging System 3 x Per Week/30 Days Discharge Instructions: Apply 3 multi-layer wrap as prescribed. Electronic Signature(s) Signed: 01/31/2023 11:08:41 AM By: Kalman Shan DO Entered By: Kalman Shan on 01/31/2023 11:02:59 Laura Santiago ([id number]) [id number] Page 6 of 13 -------------------------------------------------------------------------------- Problem List Details Patient Name: Date of Service: Laura Ace. 01/31/2023 10:00 [place] Record Number: [id number] Patient Account Number: [id number] Date of Birth/Sex: Treating RN: 1946-06-21 (77 y.o. Laura Santiago Primary Care Provider: [place] Other Santiago: Laura Santiago Referring Provider: Treating Provider/Extender: Kalman Shan [facility] Weeks in Treatment: 23 Active Problems ICD-10 Encounter Code Description Active Date MDM Diagnosis [phone] Non-pressure chronic ulcer of other part of left lower leg with fat layer exposed11/23/2022 No Yes 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 11/02/2021 No Yes I87.311 Chronic venous hypertension (idiopathic) with ulcer of right lower extremity 08/30/2022 No Yes I87.2 Venous insufficiency (chronic) (peripheral) 07/06/2021 No Yes Z79.01 Long term (current) use of anticoagulants 07/06/2021 No Yes I10 Essential (primary) hypertension 07/06/2021 No Yes 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 07/06/2021 No Yes L03.115 Cellulitis of right lower limb 12/27/2022 No Yes S81.801A Unspecified open wound, right lower leg, initial encounter 01/17/2023  No Yes Inactive Problems ICD-10 Code Description Active Date Inactive Date S81.802A Unspecified open wound, left lower leg, initial encounter 07/06/2021 07/06/2021 S91.101A Unspecified open wound of right great  toe without damage to nail, initial encounter 08/24/2021 08/24/2021 S91.104A Unspecified open wound of right lesser toe(s) without damage to nail, initial encounter 08/24/2021 08/24/2021 Laura Santiago, Laura Santiago ([id number]) [id number] Page 7 of 13 Resolved Problems ICD-10 Code Description Active Date Resolved Date S91.104D Unspecified open wound of right lesser toe(s) without damage to nail, subsequent 08/31/2021 08/31/2021 encounter S91.201D Unspecified open wound of right great toe with damage to nail, subsequent encounter 08/31/2021 08/31/2021 Electronic Signature(s) Signed: 01/31/2023 11:08:41 AM By: Kalman Shan DO Entered By: Kalman Shan on 01/31/2023 10:55:16 -------------------------------------------------------------------------------- Progress Note Details Patient Name: Date of Service: Laura Santiago [name] 01/31/2023 10:00 [place] Record Number: [id number] Patient Account Number: [id number] Date of Birth/Sex: Treating RN: 1946-12-06 (77 y.o. Laura Santiago Primary Care Provider: [place] Other Santiago: Laura Santiago Referring Provider: Treating Provider/Extender: Kalman Shan [facility] Weeks in Treatment: 51 Subjective Chief Complaint Information obtained from Patient Left lower extremity wound Right toe wounds Left upper lateral thigh wounds History of Present Illness (HPI) Admission 7/27 Ms. Sydnie Gluck is a 77 year old female with a past medical history of ADHD, metastatic breast cancer, stage IV chronic kidney disease, history of DVT on Xarelto and chronic venous insufficiency that presents to the clinic for a chronic left lower extremity wound. She recently moved to [facility] 4 days ago. She was being followed by wound care center in [name]. She reports a 10-year history of wounds to her left lower extremity that eventually do heal with debridement and compression therapy. She states that the  current wound reopened 4 months ago and she is using Vaseline and Coban. She denies signs of infection. 8/3; patient presents for 1 week follow-up. She reports no issues or complaints today. She states she had vascular studies done in the last week. She denies signs of infection. She brought her little service dog with her today. 8/17; patient presents for follow-up. She has missed her last clinic appointment. She states she took the wrap off and attempted to rewrap her leg. She is having difficulty with transportation. She has her service dog with her today. Overall she feels well and reports improvement in wound healing. She denies signs of infection. She reports owning an old Velcro wrap compression and has this at her living facility 9/14; patient presents for follow-up. Patient states that over the past 2 to 3 weeks she developed toe wounds to her right foot. She attributes this to tight fitting shoes. She subsequently developed cellulitis in the right leg and has been treated by doxycycline by her oncologist. She reports improvement in symptoms however continues to have some redness and swelling to this leg. T the left lower extremity patient has been having her wraps changed with home health twice weekly. She states that the [facility] is not helping control o the drainage. Other than that she has no issues or complaints today. She denies signs of infection to the left lower extremity. 9/21; patient presents for follow-up. She reports seeing infectious disease for her cellulitis. She reports no further management. She has home health that changes the wraps twice weekly. She has no issues or complaints today. She denies signs of infection. 10/5; patient presents for follow-up. She has no issues or complaints today. She denies signs of infection. She states that the right  great toe has not been dressed by home health. 10/12; patient presents for follow-up. She has no issues or complaints  today. She reports improvement in her wound healing. She has been using silver alginate to the right great toe wound. She denies signs of infection. 10/26; patient presents for follow-up. Home health did not have sorbact so they continued to use Hydrofera Blue under the wrap. She has been using silver alginate to the great toe wound however she did not have a dressing in place today. She currently denies signs of infection. Laura Santiago, Laura Santiago ([id number]) [id number] Page 8 of 13 11/2; patient presents for follow-up. She has been using sorb act under the compression wrap. She reports using silver alginate to the toe wound again she does not have a dressing in place. She currently denies signs of infection. 11/23; patient presents for follow-up. Unfortunately she has missed her last 2 clinic appointments. She was last seen 3 weeks ago. She did her own compression wrap with Kerlix and Coban yesterday after seeing vein and vascular. She has not been dressing her right great toe wound. She currently denies signs of infection. 11/30; patient presents for 1 week follow-up. She states she changed her dressing last week prior to home health and use sorb act with Dakin's and Hydrofera Blue. Home health has changed the dressing as well and they have been using sorbact. T oday she reports increased redness to her right lower extremity. She has a history of cellulitis to this leg. She has been using silver alginate to the right great toe. Unfortunately she had an episode of diarrhea prior to coming in and had feces all over the right leg and to the wrap of her left leg. 12/7; patient presents for 1 week follow-up. She states that home health did not come out to change the dressing and she took it off yesterday. It is unclear if she is dressing the right toe wound. She denies signs of infection. 12/14; patient presents for 1 week follow-up. She has no issues or complaints today. 12/21;  patient presents for follow-up. She has no issues or complaints today. She denies signs of infection. 12/28/2021; patient presents for follow-up. She was hospitalized for sepsis secondary to right lower extremity cellulitis On 12/23. She states she is currently at a SNF. She states that she was started on doxycycline this morning for her right great toe swelling and redness. She is not sure what dressings have been done to her left lower extremity for the past 3 weeks. She says its been mainly gauze with an Ace wrap. 1/25; patient presents for follow-up. She is still residing in a skilled nursing facility. She reports mild pain to the left lower extremity wound bed. She states she is going to see a podiatrist soon. 2/8; patient presents for follow-up. She has moved back to her residential community from her skilled nursing facility. She has no issues or complaints today. She denies signs of systemic infections. 2/15; patient presents for follow-up. He has no issues or complaints today. She denies systemic signs of infection. 2/22; patient presents for follow-up. She has no issues or complaints today. She denies signs of infection. 3/1; patient presents for follow-up. She states that home health came out the day after she was seen in our clinic and yesterday to do the wrap change. She denies signs of infection. She reports excoriated skin on the ankle. 3/8; patient presents for follow-up. She has no issues or complaints today. She denies signs of infection.  3/15; patient presents for follow-up. Home health has been coming out to change the dressings. She reports more tenderness to the wound site. She denies purulent drainage, increased warmth or erythema to the area. 4/5; patient presents for follow-up. She has missed her last 2 clinic appointments. I have not seen her in 3 weeks. She was recently hospitalized for altered mental status. She was involuntarily committed. She was evaluated by psychiatry  and deemed to have competency. There was no specific cause of her altered mental status. It was concluded that her physical and mental health were declining due to her chronic medical conditions. Currently home health has been coming out for dressing changes. Patient has also been doing her own dressing changes. She reports more skin breakdown to the periwound and now has a new wound. She denies fever/chills. She reports continued tenderness to the wound site. 4/12; patient with significant venous insufficiency and a large wound on her left lower leg taking up about 80% of the circumference of her lower leg. Cultures of this grew MRSA and Pseudomonas. She had completed a course of ciprofloxacin now is starting doxycycline. She has been using Dakin's wet-to-dry and a Tubigrip. She has home health twice a week and we change it once. 4/19; patient presents for follow-up. She completed her course of doxycycline. She has been using Dakin's wet-to-dry dressing and Tubigrip. Home health changes the dressing twice weekly. Currently she has no issues or complaints. 4/26; patient presents for follow-up. At last clinic visit orders for home health were Iodosorb under compression therapy. Unfortunately they did not have the dressing and have been using Dakin's and gentamicin under the wrap. Patient currently denies signs of infection. She has no issues or complaints today. 5/3; patient presents for follow-up. Again Iodosorb has not been used under the compression therapy when home health comes out to change the wrap and dressing. They have been using Sorbact. It is unclear why this is happening since we send orders weekly to the agency. She denies signs of infection. Patient has not purchased the [place] antibiotics. We reached out to the company and they said they have been trying to contact her on a regular basis. We gave the patient the number to call to order the medication. 5/10; patient presents for  follow-up. She has no issues or complaints today. Again home health has not been using Iodosorb. Mepilex was on the wound bed. No other dressings noted. She brought in her Keystone antibiotics. She denies signs of infection. 5/17; patient presents for follow-up. Home health has come out twice since she was last seen. Joint well she has been using Keystone antibiotic with Sorbact under the compression wrap. She has no issues or complaints today. She denies signs of infection. 5/24; patient presents for follow-up. We have been using Keystone antibiotics with Sorbact under compression therapy. She is tolerating the treatment well. She is reporting improvement in wound healing. She denies signs of infection. 5/31; patient presents for follow-up. We continue to do [facility] antibiotics with Sorbact under compression therapy. She continues to report improvement in wound healing. Home health comes out and changes the dressing once weekly. 05-17-2022 upon evaluation today patient appears to be doing better in regard to her wound especially compared to the last time I saw her. Fortunately I do think that she is seeing improvements. With that being said I do believe that she may be benefit from sharp debridement today to clear away some of the necrotic debris I discussed that with her  as well. She is an amendable to that plan. Otherwise she is very pleased with how the [employer] is doing for her. 6/14; patient presents for follow-up. We have been using Keystone antibiotic with Sorbact and absorbent dressings under 3 layer compression. She has no issues or complaints today. She reports improvement in wound healing. She denies signs of infection. 6/21; patient presents for follow-up. We are continuing with [facility] antibiotic and Sorbact under 3 layer compression. Patient has no complaints. Continued wound healing is happening. She denies signs of infection. 6/28; patient presents for follow-up. We have been using  Keystone antibiotic with Sorbact under 3 layer compression. Usually home health comes out and changes the dressing twice a week. Unfortunately they did not go out to change the dressing. It is unclear why. Patient did not call them. She currently denies signs of infection. 7/5; patient presents for follow-up. We have been using Keystone antibiotic with calcium alginate under 3 layer compression. She reports improvement in wound healing. She denies signs of infection. Home health has come out to do dressing changes twice this past week. 7/12; patient presents for follow-up. We have been using Keystone antibiotic with calcium alginate under 3 layer compression. Patient states that home health Laura Santiago, Laura Santiago [id number] ([id number]) [id number] Page 9 of 13 came out once last week to change the dressing. She reports improvement in wound healing. She currently denies signs of infection. 7/19; patient presents for follow-up. We have been using Keystone antibiotic with calcium alginate under 3 layer compression. Home health came out once last week to change the dressing. She has no issues or complaints today. She denies signs of infection. 8/2; patient presents for follow-up. We have been using Keystone antibiotic with calcium alginate under 3 layer compression. Unfortunately she missed her appointment last week and home health did not come out to do dressing changes. Patient currently denies signs of infection. 8/9; patient presents for follow-up. We have been using Keystone with calcium alginate under 3 layer compression. She states that home health came out once last week. She currently denies signs of infection. Her wrap was completely wet. She states she was cleaning the top of the leg and water soaked down into the wrap. 8/16; patient presents for follow-up. We have been using Keystone with calcium alginate under 3 layer compression. She states that home health came out twice last  week. She has no issues or complaints today. 8/23; patient presents for follow-up. He has been using Keystone with calcium alginate under 3 layer compression. Home health came out twice last week. She denies signs of infection. 8/30; patient presents for follow-up. We have been using Keystone with calcium alginate under 3 layer compression. Home health came out once last week to change the dressing. Patient reports improvement in wound healing. She states she is almost done with her chemotherapy infusions and has 1 more left. 9/13; patient presents for follow-up. She has lost the capsules to her [facility] antibiotic which I believe is the vancomycin pills. She has her Zosyn powder today. We have been using Keystone antibiotic ointment with calcium alginate under 3 layer compression. She is concerned about systemic infection however her vitals are stable and there is no surrounding soft tissue infection. She would like to remain a patient in our wound care center however would like a second opinion for her wound care at another facility. She asked to be referred to [facility] wound care center. 9/20; patient presents for follow-up. She found her vancomycin capsules and brought in  her complete Keystone antibiotic ointment set today. Unfortunately she has developed skin breakdown and Erythema to the right lower extremity With increased swelling. She states she went to a pow wow Over the weekend and was on her feet for extended periods of time. She saw her oncologist yesterday who prescribed her doxycycline for her right lower extremity erythema. 9/27; patient presents for follow-up. We have been using Keystone antibiotic with Aquacel under 3 layer compression to the lower extremities bilaterally. When home health came and changed the wrap she secretly put coffee into the spray mix along with [facility] antibiotic on her leg thinking the acidic component would better activate the zoysn (sonething she discussed with  her microbiologist brother). She has reported improvement in wound healing. 10/4; patient presents for follow-up. She has no issues or complaints today. We have been doing Aquacel and keystone under 3 layer compression to the lower extremities bilaterally. This morning she took the right lower extremity wrap off as it was uncomfortable. She has no open wounds to this leg. 10/11; patient presents for follow-up. We have been doing Aquacel with Keystone antibiotic ointment under 3 layer compression to the left lower extremity. She developed a small blister to the anterior aspect of the left leg noticed when the wrap was taken off on intake. She currently denies signs of infection. 10/18; patient presents for follow-up. We have been doing Aquacel with Keystone antibiotic ointment under 3 layer compression to the left lower extremity. There has been continued improvement in wound healing. She denies signs of infection. 10/25; patient arrives for treatment of venous insufficiency ulcers on her left lower leg both lateral and medial are remanence of apparently a circumferential wound. Much improved. We are using topicals Keystone and Aquacel Ag under 3 layer compression we continue to make good progress. The patient talk to me at some length with regards to different things she has on her forehead and her Peri orbital area for which she is apparently applying [place]. She feels that what ever we are treating on her wounds is a more systemic problem. I really was not able to get a handle on what she is talking about however I did caution her not to put the [place] in her eyes. 11/1; her wounds continue to improve she is using Keystone and Aquacel Ag G under 3 layer compression. Our intake nurse notes erythema and edema in the right leg. The patient has a litany of concerns with regards to a rash on her forehead or ears and other systemic complaints. She has an appointment with dermatology on November  11 11/8; patient presents for follow-up. We have been using Keystone and Aquacel under 4-layer compression. She has no issues or complaints today. She reports improvement in wound healing. 11/15; patient presents for follow-up. We have been using Aquacel with Keystone antibiotic under 3 layer compression. Patient continues improvement in wound healing. 12/6; patient presents for follow-up. We have been using Aquacel with Keystone antibiotic ointment under 3 layer compression. Wounds appear well-healing. 12/13; patient presents for follow-up. We have been using Aquacel with Keystone antibiotic under 3 layer compression. She has no issues or complaints today. 12/27 left lateral medial ankle. Superficial wounds remain there is significantly improved we are using Keystone backed with Zetuvit under 4-layer compression 1/3; patient presents for follow-up. Her wounds appear well-healing. We have been using Aquacel Ag with Keystone antibiotic ointment under compression therapy. This should be a 3 layer compression. 1/17; patient presents for follow-up. Her wounds on her  left lower extremity are well-healing. We are using Aquacel Ag and Keystone antibiotic ointment under compression therapy. She missed her last clinic appointment. She states that the wrap has been changed twice weekly since she was last seen. Unfortunately she has developed increased warmth and redness to the right lower extremity consistent with cellulitis. She states this started a few days ago. 1/24; patient presents for follow-up. Her wounds on the left lower extremity are well-healing with Aquacel and Keystone antibiotic ointment under compression therapy. I prescribed Keflex at last clinic visit for cellulitis of the right lower extremity. Unfortunately this has not resolved. She did not follow-up with her PCP for contact our office about her symptoms. 1/31; patient presents for follow-up. We have been using Aquacel and Keystone  antibiotic ointment under compression therapy to the left lower extremity. Wounds appear well-healing. She has been taking clindamycin for the past week. Her symptoms have improved slightly with a decrease in erythema and warmth to the right lower extremity. She says her pain level has improved. However Symptoms have not completely resolved. She denies fever/chills, nausea/vomiting. 2/7; patient presents for follow-up. We have been using Aquacel Ag with Keystone antibiotic ointment under compression therapy to the left lower extremity. For the past week home health has not been using Keystone antibiotic ointment. She continues to take clindamycin. Her symptoms have improved greatly with the decrease in erythema and warmth to the right lower extremity. She denies systemic signs of infection. She has an area of skin breakdown to the right anterior leg. 2/14; Patient presents for follow-up. She has been using Hydrofera Blue to the right anterior leg under Tubigrip. It looks like Hydrofera Blue is also being used with Keystone to the left lower extremity under compression therapy. Order is for [place] [facility]. Overall wounds appear well healing. She has no issues or complaints. 2/21; patient presents for follow-up. We have been using Hydrofera Blue under 3 layer compression to the lower extremities bilaterally. Her right lower Laura Santiago, Laura Santiago ([id number]) [id number] Page 10 of 13 extremity wounds have healed. She has a small open wound remaining to her left lateral leg. Objective Constitutional Vitals Time Taken: 10:13 AM, Height: 66 in, Weight: 153 lbs, BMI: 24.7, Temperature: 97.7 F, Pulse: 61 bpm, Respiratory Rate: 18 breaths/min, Blood Pressure: 154/93 mmHg. General Notes: Small open area to the lateral left leg With granulation tissue. T the right lower extremity there are no open wounds. No increased warmth and o erythema to the lower extremity. Integumentary  (Hair, Skin) Wound #8 status is Open. Original cause of wound was Pressure Injury. The date acquired was: 10/18/2022. The wound has been in treatment 15 weeks. The wound is located on the Left,Lateral Lower Leg. The wound measures 0.5cm length x 0.4cm width x 0.1cm depth; 0.157cm^2 area and 0.016cm^3 volume. There is a medium amount of serosanguineous drainage noted. Wound #9 status is Healed - Epithelialized. Original cause of wound was Gradually Appeared. The date acquired was: 01/17/2023. The wound has been in treatment 2 weeks. The wound is located on the Right,Midline Lower Leg. The wound measures 0cm length x 0cm width x 0cm depth; 0cm^2 area and 0cm^3 volume. There is a none present amount of drainage noted. 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 This is healed. I recommended she start wearing her compression stockings daily. We gave her Tubigrip in office. She still has a small open wound remaining to the left lower extremity that appears well-healing. I recommend Aquacel Ag under 3 layer compression here. She no longer has to use Keystone antibiotic ointment. Follow-up in 1 week. Procedures Wound #8 Pre-procedure diagnosis of Wound #8 is a Venous Leg Ulcer located on the Left,Lateral Lower Leg . There was a Three Layer Compression Therapy Procedure with a pre-treatment ABI of 1.5 by Laura Santiago. Santiago procedure  Diagnosis Wound #8: Same as Pre-Procedure Plan Follow-up Appointments: Return Appointment in 1 week. Nurse Visit as needed Home Health: [place]: Rocky Morel [phone] [place] for wound care. May utilize formulary equivalent dressing for wound treatment orders unless otherwise specified. Home Health Nurse may visit PRN to address patientoos wound care needs. - home health to see patient 2 times per week, patient to be seen at wound clinic once per week **Please direct any NON-WOUND related issues/requests for orders to patient's Primary Care Physician. **If current dressing causes regression in wound condition, may D/C ordered dressing product/s and apply Normal Saline Moist Dressing daily until next [place] or Other MD appointment. **Notify [facility] of regression in wound condition at [phone]. Other Home Health Orders/Instructions: Bathing/ Shower/ Hygiene: May shower with wound dressing protected with water repellent cover or cast protector. No tub bath. Anesthetic (Use 'Patient Medications' Section for Anesthetic Order Entry): Laura Santiago, Laura Santiago ([id number]) [id number] Page 11 of 13 Lidocaine applied to wound bed Edema Control - Lymphedema / Segmental Compressive Device / Other: Optional: One layer of unna paste to top of compression wrap (to act as an anchor). Tubigrip single layer applied. - Tubi D- Right lower leg Elevate, Exercise Daily and Avoid Standing for Long Periods of Time. Elevate legs to the level of the heart and pump ankles as often as possible Elevate leg(s) parallel to the floor when sitting. DO YOUR BEST to sleep in the bed at night. DO NOT sleep in your recliner. Long hours of sitting in a recliner leads to swelling of the legs and/or potential wounds on your backside. Additional Orders / Instructions: Follow Nutritious Diet and Increase Protein Intake Medications-Please add to  medication list.: Keystone Compound - USE ON BOTH WOUNDS WHEN CHANGING WRAP! WOUND #8: - Lower Leg Wound Laterality: Left, Lateral Cleanser: Wound Cleanser 3 x Per Week/30 Days Discharge Instructions: Wash your hands with soap and water. Remove old dressing, discard into plastic bag and place into trash. Cleanse the wound with Wound Cleanser prior to applying a clean dressing using gauze sponges, not tissues or cotton balls. Do not scrub or use excessive force. Pat dry using gauze sponges, not tissue or cotton balls. Topical: Triamcinolone Acetonide Cream, 0.1%, 15 (g) tube 3 x Per Week/30 Days Discharge Instructions: Mix 1:1 with Nystatin cream and apply to periwound and leg Prim Dressing: AquacelAg Advantage Dressing, 2X2 (in/in) 3 x Per Week/30 Days ary Discharge Instructions: Apply to wound as directed Secondary Dressing: ABD Pad 5x9 (in/in) 3 x Per Week/30 Days Discharge Instructions: Cover with ABD pad Com pression Wrap: 3-LAYER WRAP - Profore Lite LF 3 Multilayer Compression Bandaging System 3 x Per Week/30 Days Discharge Instructions: Apply 3 multi-layer wrap as  prescribed. 1. Compression stocking daily to the right lower extremity 2. Aquacel Ag under 3 layer compression to the left lower extremity 3. Follow-up in 1 week Electronic Signature(s) Signed: 01/31/2023 11:08:41 AM By: Kalman Shan DO Entered By: Kalman Shan on 01/31/2023 11:02:15 -------------------------------------------------------------------------------- ROS/PFSH Details Patient Name: Date of Service: Laura Santiago, Laura Rubenstein [name] 01/31/2023 10:00 A M Medical Record Number: [id number] Patient Account Number: [id number] Date of Birth/Sex: Treating RN: Oct 14, 1946 (77 y.o. Laura Santiago Primary Care Provider: [employer] Other Santiago: Laura Santiago Referring Provider: Treating Provider/Extender: Laura Santiago [facility] Weeks in Treatment: 70 Information Obtained  From Patient Eyes Medical History: Negative for: Cataracts; Glaucoma; Optic Neuritis Ear/Nose/Mouth/Throat Medical History: Negative for: Chronic sinus problems/congestion; Middle ear problems Hematologic/Lymphatic Medical History: Negative for: Anemia; Hemophilia; Human Immunodeficiency Virus; Lymphedema; Sickle Cell Disease Respiratory DEWAYNA, GUIANG ([id number]) [id number] Page 12 of 13 Medical History: Negative for: Aspiration; Asthma; Chronic Obstructive Pulmonary Disease (COPD); Pneumothorax; Sleep Apnea; Tuberculosis Cardiovascular Medical History: Positive for: Hypertension Negative for: Angina; Arrhythmia; Congestive Heart Failure; Coronary Artery Disease; Deep Vein Thrombosis; Hypotension; Myocardial Infarction; Peripheral Arterial Disease; Peripheral Venous Disease; Phlebitis; Vasculitis Gastrointestinal Medical History: Negative for: Cirrhosis ; Colitis; Crohns; Hepatitis A; Hepatitis B; Hepatitis C Endocrine Medical History: Negative for: Type I Diabetes; Type II Diabetes Genitourinary Medical History: Negative for: End Stage Renal Disease Immunological Medical History: Negative for: Lupus Erythematosus; Raynauds; Scleroderma Integumentary (Skin) Medical History: Negative for: History of Burn; History of pressure wounds Musculoskeletal Medical History: Positive for: Osteoarthritis Negative for: Gout; Rheumatoid Arthritis; Osteomyelitis Oncologic Medical History: Positive for: Received Chemotherapy; Received Radiation Past Medical History Notes: breast cancer Immunizations Pneumococcal Vaccine: Received Pneumococcal Vaccination: No Implantable Devices None Family and Social History 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 01/31/2023 Medical Record Number: [id number] Patient Account Number: [id number] Date of Birth/Sex: Treating RN: 03/15/1946 (77 y.o. Laura Santiago Primary Care Provider: Larned Other Santiago: Laura Santiago Referring Provider: Treating Provider/Extender: Laura Santiago [facility] Weeks in Treatment: 26 Diagnosis Coding ICD-10 Codes Code Description [phone] 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 I87.311 Chronic venous hypertension (idiopathic) with ulcer of right lower extremity I87.2 Venous insufficiency (chronic) (peripheral) Z79.01 Long term (current) use of anticoagulants I10 Essential (primary) hypertension 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 Cellulitis of right lower limb S81.801A Unspecified open wound, right lower leg, initial encounter Facility Procedures : CPT4 Code: [id number] Description: (Facility Use Only) [id number] - [place] LWR LT LEG Modifier: Quantity: 1 Physician Procedures : CPT4 Code Description Modifier [id number] - WC PHYS LEVEL 3 - EST PT ICD-10 Diagnosis Description 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.312 Chronic venous hypertension (idiopathic) with ulcer  of left lower extremity I87.311 Chronic venous hypertension (idiopathic) with ulcer of right lower extremity Quantity: 1 Electronic Signature(s) Signed: 01/31/2023 11:08:41 AM By: Kalman Shan DO Entered By: Kalman Shan on 01/31/2023 11:02:51

## 2023-02-05 NOTE — Progress Notes (Signed)
LILYMARIE, CUDDIHY ([id number]) [id number] Page 1 of 10 Visit Report for 01/31/2023 Arrival Information Details Patient Name: Date of Service: Laura Ace. 01/31/2023 10:00 [place] Record Number: [id number] Patient Account Number: [id number] Date of Birth/Sex: Treating RN: 03-05-1946 (77 y.o. Laura Santiago Primary Care Nashayla Telleria: [place] Other Clinician: [name] Referring Naijah Lacek: Treating Mekhi Sonn/Extender: [name] Weeks in Treatment: 80 Visit Information History Since Last Visit All ordered tests and consults were completed: No Patient Arrived: Laura Santiago Added or deleted any medications: No Arrival Time: 10:05 Any new allergies or adverse reactions: No Transfer Assistance: None Had a fall or experienced change in No Patient Identification Verified: Yes activities of daily living that may affect Secondary Verification Process Completed: Yes risk of falls: Patient Requires Transmission-Based No Signs or symptoms of abuse/neglect since last visito No Precautions: Hospitalized since last visit: No Patient Has Alerts: Yes Implantable device outside of the clinic excluding No Patient Alerts: PT HAS SERVICE cellular tissue based products placed in the center ANIMAL since last visit: ABI 07/11/21 Has Dressing in Place as Prescribed: Yes R) 1.16 L) 1.27 Has Compression in Place as Prescribed: Yes Pain Present Now: No Electronic Signature(s) Signed: 02/05/2023 9:50:02 AM By: [name] Entered By: [name] on 01/31/2023 10:12:50 -------------------------------------------------------------------------------- Clinic Level of Care Assessment Details Patient Name: Date of Service: Laura Ace. 01/31/2023 10:00 [place] Record Number: [id number] Patient Account Number: [id number] Date of Birth/Sex: Treating RN: Nov 27, 1946 (77 y.o. Laura Santiago Primary Care Keyvin Rison: [place] Other Clinician: [name] Referring Amaree Loisel: Treating Ahmari Duerson/Extender: [name] Weeks in Treatment: 68 Clinic Level of Care Assessment Items TOOL 1 Quantity Score '[]'$  - 0 Use when EandM and Procedure is performed on INITIAL visit ASSESSMENTS - Nursing Assessment / Reassessment '[]'$  - 0 General Physical Exam (combine w/ comprehensive assessment (listed just below) when performed on new pt. [address]EZELLE, MCELHONE ([id number]) [id number] Page 2 of 10 '[]'$  - 0 Comprehensive Assessment (HX, ROS, Risk Assessments, Wounds Hx, etc.) ASSESSMENTS - Wound and Skin Assessment / Reassessment '[]'$  - 0 Dermatologic / Skin Assessment (not related to wound area) ASSESSMENTS - Ostomy and/or Continence Assessment and Care '[]'$  - 0 Incontinence Assessment and Management '[]'$  - 0 Ostomy Care Assessment and Management (repouching, etc.) PROCESS - Coordination of Care '[]'$  - 0 Simple Patient / Family Education for ongoing care '[]'$  - 0 Complex (extensive) Patient / Family Education for ongoing care '[]'$  - 0 Staff obtains Programmer, systems, Records, T Results / Process Orders est '[]'$  - 0 Staff telephones HHA, Nursing Homes / Clarify orders / etc '[]'$  - 0 Routine Transfer to another Facility (non-emergent condition) '[]'$  - 0 Routine Hospital Admission (non-emergent condition) '[]'$  - 0 New Admissions / Biomedical engineer / Ordering NPWT Apligraf, etc. , '[]'$  - 0 Emergency Hospital Admission (emergent condition) PROCESS - Special Needs '[]'$  - 0 Pediatric / Minor Patient Management '[]'$  - 0 Isolation Patient Management '[]'$  - 0 Hearing / Language / Visual special needs '[]'$  - 0 Assessment of Community assistance (transportation, D/C planning, etc.) '[]'$  - 0 Additional assistance / Altered mentation '[]'$  - 0 Support Surface(s) Assessment (bed, cushion, seat, etc.) INTERVENTIONS - Miscellaneous '[]'$  - 0 External ear exam '[]'$  - 0 Patient Transfer  (multiple staff / Civil Service fast streamer / Similar devices) '[]'$  - 0 Simple Staple / Suture removal (25 or less) '[]'$  - 0 Complex Staple / Suture removal (26 or  more) '[]'$  - 0 Hypo/Hyperglycemic Management (do not check if billed separately) '[]'$  - 0 Ankle / Brachial Index (ABI) - do not check if billed separately Has the patient been seen at the hospital within the last three years: Yes Total Score: 0 Level Of Care: ____ Electronic Signature(s) Signed: 02/05/2023 9:50:02 AM By: [name] Entered By: [name] on 01/31/2023 10:40:09 -------------------------------------------------------------------------------- Compression Therapy Details Patient Name: Date of Service: Laura Jewett [name] 01/31/2023 10:00 [place] Record Number: [id number] Patient Account Number: [id number] Date of Birth/Sex: Treating RN: 1946-01-14 (77 y.o. Laura Santiago Primary Care Raymie Giammarco: [place] Other Clinician: Maycie, Carley ([id number]) [id number] Page 3 of 10 Referring Samari Gorby: Treating Naoma Boxell/Extender: [name] Weeks in Treatment: 48 Compression Therapy Performed for Wound Assessment: Wound #8 Left,Lateral Lower Leg Performed By: [name], [name], Compression Type: Three Layer Pre Treatment ABI: 1.5 Santiago Procedure Diagnosis Same as Pre-procedure Electronic Signature(s) Signed: 02/05/2023 9:50:02 AM By: [name] Entered By: [name] on 01/31/2023 10:38:29 -------------------------------------------------------------------------------- Encounter Discharge Information Details Patient Name: Date of Service: Laura Jewett [name] 01/31/2023 10:00 [place] Record Number: [id number] Patient Account Number: [id number] Date of Birth/Sex: Treating RN: 20-Jul-1946 (77 y.o. Laura Santiago Primary Care Tylah Mancillas: [place] Other Clinician: [name] Referring Tahliyah Anagnos: Treating  Salina Stanfield/Extender: [name] Weeks in Treatment: 83 Encounter Discharge Information Items Discharge Condition: Stable Ambulatory Status: [place] Discharge Destination: Home Transportation: Other Accompanied By: self Schedule Follow-up Appointment: Yes Clinical Summary of Care: Electronic Signature(s) Signed: 02/05/2023 9:50:02 AM By: [name] Entered By: [name] on 01/31/2023 16:47:38 -------------------------------------------------------------------------------- Lower Extremity Assessment Details Patient Name: Date of Service: Laura Jewett [name] 01/31/2023 10:00 A M Medical Record Number: [id number] Patient Account Number: [id number] Date of Birth/Sex: Treating RN: 12/08/1946 (77 y.o. Laura Santiago Primary Care Breon Rehm: [place] Other Clinician: [name] Referring Lyanne Kates: Treating Lorien Shingler/Extender: Kalman Shan [facility] Weeks in Treatment: [address] [place] ([id number]) [id number] Page 4 of 10 Edema Assessment Assessed: [Left: Yes] [Right: Yes] Edema: [Left: Yes] [Right: Yes] Calf Left: Right: Point of Measurement: 35 cm From Medial Instep 33 cm 35 cm Ankle Left: Right: Point of Measurement: 12 cm From Medial Instep 22.3 cm 22 cm Vascular Assessment Pulses: Dorsalis Pedis Palpable: [Left:Yes] [Right:Yes] Electronic Signature(s) Signed: 02/01/2023 11:31:26 AM By: Gretta Cool, BSN, RN, CWS, [name] RN, BSN Signed: 02/05/2023 9:50:02 AM By: [name] Entered By: [name] on 01/31/2023 10:25:55 -------------------------------------------------------------------------------- Multi Wound Chart Details Patient Name: Date of Service: Laura Jewett [name] 01/31/2023 10:00 A M Medical Record Number: [id number] Patient Account Number: [id number] Date of Birth/Sex: Treating RN: [birth year] (77 y.o. Laura Santiago Primary Care Brenly Trawick: [place] Other Clinician:  [name] Referring Lashai Grosch: Treating Haleigh Desmith/Extender: [name] Weeks in Treatment: 63 Vital Signs Height(in): 66 Pulse(bpm): 61 Weight(lbs): 153 Blood Pressure(mmHg): 154/93 Body Mass Index(BMI): 24.7 Temperature(F): 97.7 Respiratory Rate(breaths/min): 18 [8:Photos:] [N/A:N/A] Left, Lateral Lower Leg Right, Midline Lower Leg N/A Wound Location: Pressure Injury Gradually Appeared N/A Wounding Event: Venous Leg Ulcer Venous Leg Ulcer N/A Primary Etiology: Hypertension, Osteoarthritis, ReceivedHypertension, Osteoarthritis, ReceivedN/A Comorbid History: Chemotherapy, Received Radiation Chemotherapy, Received Radiation 10/18/2022 01/17/2023 N/A Date Acquired: 43 2 N/A Weeks of Treatment: Open Healed - Epithelialized N/A Wound Status: Laura Santiago, Laura Santiago ([id number]) [id number] Page 5 of 10 No No N/A Wound Recurrence: No Yes N/A Clustered Wound:  0.5x0.4x0.1 0x0x0 N/A Measurements L x W x D (cm) 0.157 0 N/A A (cm) : rea 0.016 0 N/A Volume (cm) : 96.20% 100.00% N/A % Reduction in Area: 98.10% 100.00% N/A % Reduction in Volume: Full Thickness Without Exposed Partial Thickness N/A Classification: Support Structures Medium None Present N/A Exudate A mount: Serosanguineous N/A N/A Exudate Type: red, brown N/A N/A Exudate Color: N/A Large (67-100%) N/A Epithelialization: Treatment Notes Electronic Signature(s) Signed: 02/05/2023 9:50:02 AM By: [name] Entered By: [name] on 01/31/2023 10:26:02 -------------------------------------------------------------------------------- [place] Details Patient Name: Date of Service: Laura Jewett [name] 01/31/2023 10:00 A M Medical Record Number: [id number] Patient Account Number: [id number] Date of Birth/Sex: Treating RN: 1946/07/12 (77 y.o. Laura Santiago Primary Care Skyy Nilan: [place] Other Clinician: [name] Referring Annina Piotrowski: Treating Aamira Bischoff/Extender: [name] Weeks in Treatment: 11 Active Inactive Necrotic Tissue Nursing Diagnoses: Impaired tissue integrity related to necrotic/devitalized tissue Knowledge deficit related to management of necrotic/devitalized tissue Goals: Necrotic/devitalized tissue will be minimized in the wound bed Date Initiated: 07/06/2021 Target Resolution Date: 12/13/2022 Goal Status: Active Patient/caregiver will verbalize understanding of reason and process for debridement of necrotic tissue Date Initiated: 07/06/2021 Date Inactivated: 10/05/2021 Target Resolution Date: 07/06/2021 Goal Status: Met Interventions: Assess patient pain level pre-, during and Santiago procedure and prior to discharge Provide education on necrotic tissue and debridement process Treatment Activities: Apply topical anesthetic as ordered : 07/06/2021 Biologic debridement : 07/06/2021 Enzymatic debridement : 07/06/2021 Excisional debridement : 07/06/2021 Notes: Soft Tissue Infection Nursing Diagnoses: Laura Santiago, Laura Santiago ([id number]) [phone].pdf Page 6 of 10 Impaired tissue integrity Potential for infection: soft tissue Goals: Patient's soft tissue infection will resolve Date Initiated: 03/15/2022 Target Resolution Date: 12/13/2022 Goal Status: Active Signs and symptoms of infection will be recognized early to allow for prompt treatment Date Initiated: 03/15/2022 Target Resolution Date: 12/13/2022 Goal Status: Active Interventions: Assess signs and symptoms of infection every visit Treatment Activities: Culture and sensitivity : 03/15/2022 Notes: Electronic Signature(s) Signed: 02/01/2023 11:31:26 AM By: Gretta Cool, BSN, RN, CWS, [name] RN, BSN Signed: 02/05/2023 9:50:02 AM By: [name] Entered By: [name] on 01/31/2023 10:57:28 -------------------------------------------------------------------------------- Pain Assessment  Details Patient Name: Date of Service: Laura Jewett [name] 01/31/2023 10:00 A M Medical Record Number: [id number] Patient Account Number: [id number] Date of Birth/Sex: Treating RN: February 19, 1946 (77 y.o. Laura Santiago Primary Care Sybilla Malhotra: [place] Other Clinician: [name] Referring Jeralyn Nolden: Treating Rochel Privett/Extender: [name] Weeks in Treatment: 69 Active Problems Location of Pain Severity and Description of Pain Patient Has Paino No Site Locations Pain Management and Medication Current Pain Management: Electronic Signature(s) Laura Santiago, Laura Santiago ([id number]) [id number] Page 7 of 10 Signed: 02/01/2023 11:31:26 AM By: Gretta Cool, BSN, RN, CWS, [name] RN, BSN Signed: 02/05/2023 9:50:02 AM By: [name] Entered By: [name] on 01/31/2023 10:15:24 -------------------------------------------------------------------------------- Patient/Caregiver Education Details Patient Name: Date of Service: Laura Jewett [name] 2/21/2024andnbsp10:00 [place] Record Number: [id number] Patient Account Number: [id number] Date of Birth/Gender: Treating RN: 02-18-1946 (77 y.o. Laura Santiago Primary Care Physician: [place] Other Clinician: [name] Referring Physician: Treating Physician/Extender: [place], [place] Weeks in Treatment: 41 Education Assessment Education Provided To: Patient Education Topics Provided Wound/Skin Impairment: Handouts: Other: continue wound care as directed Methods: Explain/Verbal Responses: State content correctly Electronic Signature(s) Signed: 02/05/2023 9:50:02 AM By: [name] Entered By: [name] on 01/31/2023 10:57:46 -------------------------------------------------------------------------------- Wound Assessment Details Patient  Name: Date of Service: Laura Ace. 01/31/2023 10:00 A M Medical Record Number:  [id number] Patient Account Number: [id number] Date of Birth/Sex: Treating RN: 11/25/1946 (77 y.o. Laura Santiago Primary Care Taela Charbonneau: [place] Other Clinician: [name] Referring Starlett Pehrson: Treating Sayra Frisby/Extender: [name] Weeks in Treatment: 65 Wound Status Wound Number: 8 Primary Venous Leg Ulcer Etiology: Wound Location: Left, Lateral Lower Leg Wound Status: Open Wounding Event: Pressure Injury Comorbid Hypertension, Osteoarthritis, Received Chemotherapy, Date Acquired: 10/18/2022 History: Received Radiation Weeks Of Treatment: 15 Clustered Wound: No Laura Santiago, Laura Santiago ([id number]) [id number] Page 8 of 10 Photos Wound Measurements Length: (cm) 0.5 Width: (cm) 0.4 Depth: (cm) 0.1 Area: (cm) 0.157 Volume: (cm) 0.016 % Reduction in Area: 96.2% % Reduction in Volume: 98.1% Wound Description Classification: Full Thickness Without Exposed Support Exudate Amount: Medium Exudate Type: Serosanguineous Exudate Color: red, brown Structures Treatment Notes Wound #8 (Lower Leg) Wound Laterality: Left, Lateral Cleanser Wound Cleanser Discharge Instruction: Wash your hands with soap and water. Remove old dressing, discard into plastic bag and place into trash. Cleanse the wound with Wound Cleanser prior to applying a clean dressing using gauze sponges, not tissues or cotton balls. Do not scrub or use excessive force. Pat dry using gauze sponges, not tissue or cotton balls. Peri-Wound Care Topical Triamcinolone Acetonide Cream, 0.1%, 15 (g) tube Discharge Instruction: Mix 1:1 with Nystatin cream and apply to periwound and leg Primary Dressing AquacelAg Advantage Dressing, 2X2 (in/in) Discharge Instruction: Apply to wound as directed Secondary Dressing ABD Pad 5x9 (in/in) Discharge Instruction: Cover with ABD pad Secured With Compression Wrap 3-LAYER WRAP - Profore Lite LF 3 Multilayer Compression  Bandaging System Discharge Instruction: Apply 3 multi-layer wrap as prescribed. Compression Stockings Environmental education officer) Signed: 02/01/2023 11:31:26 AM By: Gretta Cool, BSN, RN, CWS, [name] RN, BSN Signed: 02/05/2023 9:50:02 AM By: [name] Entered By: [name] on 01/31/2023 10:23:03 Laura Santiago ([id number]) [id number] Page 9 of 10 -------------------------------------------------------------------------------- Wound Assessment Details Patient Name: Date of Service: Laura Ace. 01/31/2023 10:00 A M Medical Record Number: [id number] Patient Account Number: [id number] Date of Birth/Sex: Treating RN: July 07, 1946 (76 y.o. Laura Santiago Primary Care Hence Derrick: [place] Other Clinician: [name] Referring Saron Vanorman: Treating Jaye Saal/Extender: [name] Weeks in Treatment: 60 Wound Status Wound Number: 9 Primary Venous Leg Ulcer Etiology: Wound Location: Right, Midline Lower Leg Wound Status: Healed - Epithelialized Wounding Event: Gradually Appeared Comorbid Hypertension, Osteoarthritis, Received Chemotherapy, Date Acquired: 01/17/2023 History: Received Radiation Weeks Of Treatment: 2 Clustered Wound: Yes Photos Wound Measurements Length: (cm) Width: (cm) Depth: (cm) Area: (cm) Volume: (cm) 0 % Reduction in Area: 100% 0 % Reduction in Volume: 100% 0 Epithelialization: Large (67-100%) 0 0 Wound Description Classification: Partial Thickness Exudate Amount: None Present Treatment Notes Wound #9 (Lower Leg) Wound Laterality: Right, Midline Cleanser Peri-Wound Care Topical Primary Dressing Secondary Dressing Secured With Compression Wrap Compression Stockings Add-Ons Electronic Signature(s) Laura Santiago, Laura Santiago ([id number]) [id number] Page 10 of 10 Signed: 02/01/2023 11:31:26 AM By: Gretta Cool, BSN, RN, CWS, [name] RN, BSN Signed: 02/05/2023 9:50:02 AM By:  [name] Entered By: [name] on 01/31/2023 10:23:50 -------------------------------------------------------------------------------- Vitals Details Patient Name: Date of Service: Laura Santiago, Laura [name] 01/31/2023 10:00 A M Medical Record Number: [id number] Patient Account Number: [id number] Date of Birth/Sex: Treating RN: Feb 05, 1946 (76 y.o. Laura Santiago Primary Care Dalissa Lovin: [place] Other Clinician: [name] Referring Kadia Abaya: Treating Mahesh Sizemore/Extender: [name] Weeks in Treatment: 82 Vital Signs Time Taken: 10:13 Temperature (F): 97.7 Height (in): 66 Pulse (bpm): 61 Weight (lbs): 153 Respiratory Rate (breaths/min): 18 Body Mass Index (BMI): 24.7 Blood Pressure (mmHg): 154/93 Reference Range: 80 - 120 mg / dl Electronic Signature(s) Signed: 02/05/2023 9:50:02 AM By: [name] Entered By: [name] on 01/31/2023 10:15:20

## 2023-02-06 ENCOUNTER — Ambulatory Visit (INDEPENDENT_AMBULATORY_CARE_PROVIDER_SITE_OTHER): Payer: Medicare Other | Admitting: Dermatology

## 2023-02-06 DIAGNOSIS — L309 Dermatitis, unspecified: Secondary | ICD-10-CM

## 2023-02-06 DIAGNOSIS — M793 Panniculitis, unspecified: Secondary | ICD-10-CM | POA: Diagnosis not present

## 2023-02-06 DIAGNOSIS — L219 Seborrheic dermatitis, unspecified: Secondary | ICD-10-CM | POA: Diagnosis not present

## 2023-02-06 DIAGNOSIS — L281 Prurigo nodularis: Secondary | ICD-10-CM

## 2023-02-06 DIAGNOSIS — B353 Tinea pedis: Secondary | ICD-10-CM

## 2023-02-06 NOTE — Patient Instructions (Addendum)
Hand Dermatitis is a chronic type of eczema that can come and go on the hands and fingers.  While there is no cure, the rash and symptoms can be managed with topical prescription medications, and for more severe cases, with systemic medications.  Recommend mild soap and routine use of moisturizing cream after handwashing.  Minimize soap/water exposure when possible.   Continue Hydrocortisone 2.5% Cream to hands once to twice daily as needed for itch. Continue Eucerin Cream after hand washing.     Tinea (fungus) - Start Ketoconazole 2% cream Apply between toes every evening.   Lipodermatosclerosis is a chronic inflammatory condition of unknown cause of the subcutaneous fat causing tenderness, discoloration and hardening of the involved skin, most commonly on the lower legs. Discussed that it may progress and gradually worsen over time, especially in the setting of chronic leg swelling. Daily compression stockings/hose is recommended. 2.5% hydrocortisone cream - Apply to itchy spots on legs once to twice daily as needed.    Seborrheic Dermatitis - face, scalp, ears  Mix hydrocortisone with ketaconazole 2% twice a day. If improved, decrease to hydrocortisone and ketaconazole mixed once a day. If still clear, decrease to ketaconazole only. Continue ketoconazole 2% shampoo - massage into scalp and let sit several minutes before rinsing. Use 3x/wk.   Dry Skin Care  What causes dry skin?  Dry skin is common and results from inadequate moisture in the outer skin layers. Dry skin usually results from the excessive loss of moisture from the skin surface. This occurs due to two major factors: Normally the skin's oil glands deposit a layer of oil on the skin's surface. This layer of oil prevents the loss of moisture from the skin. Exposure to soaps, cleaners, solvents, and disinfectants removes this oily film, allowing water to escape. Water loss from the skin increases when the humidity is low. During  winter months we spend a lot of time indoors where the air is heated. Heated air has very low humidity. This also contributes to dry skin.  A tendency for dry skin may accompany such disorders as eczema. Also, as people age, the number of functioning oil glands decreases, and the tendency toward dry skin can be a sensation of skin tightness when emerging from the shower.  How do I manage dry skin?  Humidify your environment. This can be accomplished by using a humidifier in your bedroom at night during winter months. Bathing can actually put moisture back into your skin if done right. Take the following steps while bathing to sooth dry skin: Avoid hot water, which only dries the skin and makes itching worse. Use warm water. Avoid washcloths or extensive rubbing or scrubbing. Use mild soaps like unscented Dove, Oil of Olay, Cetaphil, Basis, or CeraVe. If you take baths rather than showers, rinse off soap residue with clean water before getting out of tub. Once out of the shower/tub, pat dry gently with a soft towel. Leave your skin damp. While still damp, apply any medicated ointment/cream you were prescribed to the affected areas. After you apply your medicated ointment/cream, then apply your moisturizer to your whole body.This is the most important step in dry skin care. If this is omitted, your skin will continue to be dry. The choice of moisturizer is also very important. In general, lotion will not provider enough moisture to severely dry skin because it is water based. You should use an ointment or cream. Moisturizers should also be unscented. Good choices include Vaseline (plain petrolatum), Aquaphor, Cetaphil,  CeraVe, Vanicream, DML Forte, Aveeno moisture, or Eucerin Cream. Bath oils can be helpful, but do not replace the application of moisturizer after the bath. In addition, they make the tub slippery causing an increased risk for falls. Therefore, we do not recommend their use. Due to  recent changes in healthcare laws, you may see results of your pathology and/or laboratory studies on MyChart before the doctors have had a chance to review them. We understand that in some cases there may be results that are confusing or concerning to you. Please understand that not all results are received at the same time and often the doctors may need to interpret multiple results in order to provide you with the best plan of care or course of treatment. Therefore, we ask that you please give [place] 2 business days to thoroughly review all your results before contacting the office for clarification. Should we see a critical lab result, you will be contacted sooner.   If You Need Anything After Your Visit  If you have any questions or concerns for your doctor, please call our main line at [phone] and press option 4 to reach your doctor's medical assistant. If no one answers, please leave a voicemail as directed and we will return your call as soon as possible. Messages left after 4 pm will be answered the following business day.   You may also send [place] a message via [place]. We typically respond to MyChart messages within 1-2 business days.  For prescription refills, please ask your pharmacy to contact our office. Our fax number is [phone].  If you have an urgent issue when the clinic is closed that cannot wait until the next business day, you can page your doctor at the number below.    Please note that while we do our best to be available for urgent issues outside of office hours, we are not available 24/7.   If you have an urgent issue and are unable to reach [place], you may choose to seek medical care at your doctor's office, retail clinic, urgent care center, or emergency room.  If you have a medical emergency, please immediately call 911 or go to the emergency department.  Pager Numbers  - Dr. Nehemiah Massed: [phone]  - Dr. Laurence Ferrari: [phone]  - Dr. Nicole Kindred: [phone]  In the  event of inclement weather, please call our main line at [phone] for an update on the status of any delays or closures.  Dermatology Medication Tips: Please keep the boxes that topical medications come in in order to help keep track of the instructions about where and how to use these. Pharmacies typically print the medication instructions only on the boxes and not directly on the medication tubes.   If your medication is too expensive, please contact our office at [phone] option 4 or send [place] a message through [place].   We are unable to tell what your co-pay for medications will be in advance as this is different depending on your insurance coverage. However, we may be able to find a substitute medication at lower cost or fill out paperwork to get insurance to cover a needed medication.   If a prior authorization is required to get your medication covered by your insurance company, please allow [place] 1-2 business days to complete this process.  Drug prices often vary depending on where the prescription is filled and some pharmacies may offer cheaper prices.  The website www.goodrx.com contains coupons for medications through different pharmacies. The prices here do  not account for what the cost may be with help from insurance (it may be cheaper with your insurance), but the website can give you the price if you did not use any insurance.  - You can print the associated coupon and take it with your prescription to the pharmacy.  - You may also stop by our office during regular business hours and pick up a GoodRx coupon card.  - If you need your prescription sent electronically to a different pharmacy, notify our office through [facility] or by phone at [phone] option 4.     Si Usted Necesita Algo Despus de Su Visita  Tambin puede enviarnos un mensaje a travs de Pharmacist, community. Por lo general respondemos a los mensajes de MyChart en el transcurso de 1 a 2 das hbiles.  Para  renovar recetas, por favor pida a su farmacia que se ponga en contacto con nuestra oficina. Harland Dingwall de fax es [place] [phone].  Si tiene un asunto urgente cuando la clnica est cerrada y que no puede esperar hasta el siguiente da hbil, puede llamar/localizar a su doctor(a) al nmero que aparece a continuacin.   Por favor, tenga en cuenta que aunque hacemos todo lo posible para estar disponibles para asuntos urgentes fuera del horario de [place], no estamos disponibles las 24 horas del da, los 7 das de la [place].   Si tiene un problema urgente y no puede comunicarse con nosotros, puede optar por buscar atencin mdica  en el consultorio de su doctor(a), en una clnica privada, en un centro de atencin urgente o en una sala de emergencias.  Si tiene Engineering geologist, por favor llame inmediatamente al 911 o vaya a la sala de emergencias.  Nmeros de bper  - Dr. Nehemiah Massed: [phone]  - Dra. Moye: [phone]  - Dra. Nicole Kindred: [phone]  En caso de inclemencias del [place], por favor llame a Johnsie Kindred principal al [phone] para una actualizacin sobre el [place] de cualquier retraso o cierre.  Consejos para la medicacin en dermatologa: Por favor, guarde las cajas en las que vienen los medicamentos de uso tpico para ayudarle a seguir las instrucciones sobre dnde y cmo usarlos. Las farmacias generalmente imprimen las instrucciones del medicamento slo en las cajas y no directamente en los tubos del [place].   Si su medicamento es muy caro, por favor, pngase en contacto con Zigmund Daniel llamando al [phone] y presione la opcin 4 o envenos un mensaje a travs de Pharmacist, community.   No podemos decirle cul ser su copago por los medicamentos por adelantado ya que esto es diferente dependiendo de la cobertura de su seguro. Sin embargo, es posible que podamos encontrar un medicamento sustituto a Electrical engineer un formulario para que el seguro cubra el  medicamento que se considera necesario.   Si se requiere una autorizacin previa para que su compaa de seguros [place] su medicamento, por favor permtanos de 1 a 2 das hbiles para completar este proceso.  Los precios de los medicamentos varan con frecuencia dependiendo del Environmental consultant de dnde se surte la receta y alguna farmacias pueden ofrecer precios ms baratos.  El sitio web www.goodrx.com tiene cupones para medicamentos de Airline pilot. Los precios aqu no tienen en cuenta lo que podra costar con la ayuda del seguro (puede ser ms barato con su seguro), pero el sitio web puede darle el precio si no utiliz Research scientist (physical sciences).  - Puede imprimir el cupn correspondiente y llevarlo con su receta a la farmacia.  -  Tambin puede pasar por nuestra oficina durante el horario de atencin regular y Charity fundraiser una tarjeta de cupones de GoodRx.  - Si necesita que su receta se enve electrnicamente a una farmacia diferente, informe a nuestra oficina a travs de MyChart de [place] o por telfono llamando al [phone] y presione la opcin 4.

## 2023-02-06 NOTE — Progress Notes (Signed)
Follow-Up Visit   Subjective  Laura Santiago is a 77 y.o. female who presents for the following: Follow-up.  Patient presents for 2 month follow-up seborrheic dermatitis of the scalp, face, ears. She is improved with ketoconazole 2% shampoo 3x/week and hydrocortisone 2.5% cream. She also has itching on her back and hands. She is currently being treated at the wound clinic for ulcer of the left lower leg. She states it is much better.   The following portions of the chart were reviewed this encounter and updated as appropriate:       Review of Systems:  No other skin or systemic complaints except as noted in HPI or Assessment and Plan.  Objective  Well appearing patient in no apparent distress; mood and affect are within normal limits.  A focused examination was performed including face, scalp, ears, arms, feet. Relevant physical exam findings are noted in the Assessment and Plan.  scalp, face, ears Clear  hands Scaly erythematous patches   right lower knee 1.5cm pink firm excoriated papule  lower legs R lower leg with erythema and hardening of skin with hour glass deformity, L lower leg is wrapped  feet Scaling and maceration web spaces.     Assessment & Plan  Seborrheic dermatitis scalp, face, ears  Chronic condition with duration or expected duration over one year. Currently well-controlled.   Seborrheic Dermatitis  -  is a chronic persistent rash characterized by pinkness and scaling most commonly of the mid face but also can occur on the scalp (dandruff), ears; mid chest, mid back and groin.  It tends to be exacerbated by stress and cooler weather.  People who have neurologic disease may experience new onset or exacerbation of existing seborrheic dermatitis.  The condition is not curable but treatable and can be controlled.  Continue Ketoconazole 2% shampoo as directed: apply every other day massage into scalp and leave in for 5 minutes before rinsing out.    Continue Hydrocortisone 2.5% twice daily to ears and forehead as needed for itching.  Continue Lidex scalp solution qd/bid to aas scalp prn flares      Related Medications ketoconazole (NIZORAL) 2 % shampoo apply every other day  massage into scalp and leave in for 5 minutes before rinsing out  hydrocortisone 2.5 % cream Apply 1 to 2 times daily for itchy rash at face, scalp, ears, and forehead.  fluocinonide (LIDEX) 0.05 % external solution Apply once or twice daily to scalp as needed for itching.  Avoid applying to face, groin, and axilla.  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 Condition is symptomatic / bothersome to patient. Not to goal and currently flared.  Hand Dermatitis is a chronic type of eczema that can come and go on the hands and fingers.  While there is no cure, the rash and symptoms can be managed with topical prescription medications, and for more severe cases, with systemic medications.  Recommend mild soap and routine use of moisturizing cream after handwashing.  Minimize soap/water exposure when possible.    Start hydrocortisone 2.5% cream Apply QD/BID prn itchy rash.     Prurigo nodularis right lower knee  Benign-appearing. Observation.   Avoid scratching/picking.   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 Condition is symptomatic / bothersome to patient. Not to goal.  Lipodermatosclerosis is a chronic  inflammatory condition of unknown cause of the subcutaneous fat causing tenderness, discoloration and hardening of the involved skin, most commonly on the lower legs. Discussed that it may progress and gradually worsen over time, especially in the setting of chronic leg swelling. Daily compression stockings/hose is recommended.  Start 2.5%  hydrocortisone cream BID prn itch. Recommend mild soap and moisturizing cream 1-2 times daily.  Currently being seen by wound care for healing wound at left lower extremities- healing well per patient with topical compounded antibiotic spray.   Tinea pedis of both feet feet  Start Ketoconazole 2% cream Apply between toes QHS dsp 60g 1Rf - Rx called in to [facility].    Return in about 3 months (around 05/07/2023) for recheck.  IJamesetta Orleans, CMA, am acting as scribe for Brendolyn Patty, MD .  Documentation: I have reviewed the above documentation for accuracy and completeness, and I agree with the above.  Brendolyn Patty MD

## 2023-02-07 ENCOUNTER — Encounter (HOSPITAL_BASED_OUTPATIENT_CLINIC_OR_DEPARTMENT_OTHER): Payer: Medicare Other | Admitting: Internal Medicine

## 2023-02-07 ENCOUNTER — Encounter: Payer: Self-pay | Admitting: Hospice and Palliative Medicine

## 2023-02-07 ENCOUNTER — Inpatient Hospital Stay: Payer: Medicare Other | Attending: Oncology

## 2023-02-07 DIAGNOSIS — C774 Secondary and unspecified malignant neoplasm of inguinal and lower limb lymph nodes: Secondary | ICD-10-CM | POA: Insufficient documentation

## 2023-02-07 DIAGNOSIS — I87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: Secondary | ICD-10-CM

## 2023-02-07 DIAGNOSIS — C50911 Malignant neoplasm of unspecified site of right female breast: Secondary | ICD-10-CM | POA: Insufficient documentation

## 2023-02-07 DIAGNOSIS — C7951 Secondary malignant neoplasm of bone: Secondary | ICD-10-CM | POA: Diagnosis not present

## 2023-02-07 DIAGNOSIS — Z171 Estrogen receptor negative status [ER-]: Secondary | ICD-10-CM | POA: Insufficient documentation

## 2023-02-07 DIAGNOSIS — Z95828 Presence of other vascular implants and grafts: Secondary | ICD-10-CM

## 2023-02-07 DIAGNOSIS — L97822 Non-pressure chronic ulcer of other part of left lower leg with fat layer exposed: Secondary | ICD-10-CM

## 2023-02-07 DIAGNOSIS — I87311 Chronic venous hypertension (idiopathic) with ulcer of right lower extremity: Secondary | ICD-10-CM | POA: Diagnosis not present

## 2023-02-07 DIAGNOSIS — C50919 Malignant neoplasm of unspecified site of unspecified female breast: Secondary | ICD-10-CM

## 2023-02-07 LAB — COMPREHENSIVE METABOLIC PANEL
ALT: 11 U/L (ref 0–44)
AST: 19 U/L (ref 15–41)
Albumin: 3.7 g/dL (ref 3.5–5.0)
Alkaline Phosphatase: 65 U/L (ref 38–126)
Anion gap: 8 (ref 5–15)
BUN: 36 mg/dL — ABNORMAL HIGH (ref 8–23)
CO2: 25 mmol/L (ref 22–32)
Calcium: 8.5 mg/dL — ABNORMAL LOW (ref 8.9–10.3)
Chloride: 105 mmol/L (ref 98–111)
Creatinine, Ser: 1.47 mg/dL — ABNORMAL HIGH (ref 0.44–1.00)
GFR, Estimated: 36 mL/min — ABNORMAL LOW (ref >60)
Glucose, Bld: 108 mg/dL — ABNORMAL HIGH (ref 70–99)
Potassium: 4.5 mmol/L (ref 3.5–5.1)
Sodium: 138 mmol/L (ref 135–145)
Total Bilirubin: 0.5 mg/dL (ref 0.3–1.2)
Total Protein: 7.2 g/dL (ref 6.5–8.1)

## 2023-02-07 LAB — CBC WITH DIFFERENTIAL/PLATELET
Abs Immature Granulocytes: 0.01 10*3/uL (ref 0.00–0.07)
Basophils Absolute: 0 10*3/uL (ref 0.0–0.1)
Basophils Relative: 1 %
Eosinophils Absolute: 0.2 10*3/uL (ref 0.0–0.5)
Eosinophils Relative: 3 %
HCT: 35.4 % — ABNORMAL LOW (ref 36.0–46.0)
Hemoglobin: 10.9 g/dL — ABNORMAL LOW (ref 12.0–15.0)
Immature Granulocytes: 0 %
Lymphocytes Relative: 20 %
Lymphs Abs: 1 10*3/uL (ref 0.7–4.0)
MCH: 27.6 pg (ref 26.0–34.0)
MCHC: 30.8 g/dL (ref 30.0–36.0)
MCV: 89.6 fL (ref 80.0–100.0)
Monocytes Absolute: 0.4 10*3/uL (ref 0.1–1.0)
Monocytes Relative: 7 %
Neutro Abs: 3.4 10*3/uL (ref 1.7–7.7)
Neutrophils Relative %: 69 %
Platelets: 193 10*3/uL (ref 150–400)
RBC: 3.95 MIL/uL (ref 3.87–5.11)
RDW: 17 % — ABNORMAL HIGH (ref 11.5–15.5)
WBC: 4.9 10*3/uL (ref 4.0–10.5)
nRBC: 0 % (ref 0.0–0.2)

## 2023-02-07 MED ORDER — SODIUM CHLORIDE 0.9% FLUSH
10.0000 mL | Freq: Once | INTRAVENOUS | Status: AC
  Administered 2023-02-07: 10 mL via INTRAVENOUS
  Filled 2023-02-07: qty 10

## 2023-02-07 MED ORDER — HEPARIN SOD (PORK) LOCK FLUSH 100 UNIT/ML IV SOLN
500.0000 [IU] | Freq: Once | INTRAVENOUS | Status: AC
  Administered 2023-02-07: 500 [IU] via INTRAVENOUS
  Filled 2023-02-07: qty 5

## 2023-02-08 ENCOUNTER — Other Ambulatory Visit: Payer: Self-pay | Admitting: Hospice and Palliative Medicine

## 2023-02-08 MED ORDER — FENTANYL 25 MCG/HR TD PT72: MEDICATED_PATCH | TRANSDERMAL | 0 refills | Status: DC

## 2023-02-08 NOTE — Progress Notes (Signed)
Patient unable to get fentanyl prescription filled from pharmacy due to prescribing.  Will print hardcopy for family to pick up.

## 2023-02-11 NOTE — Progress Notes (Signed)
DIMPLE, WILLIQUETTE ([id number]) [id number] Page 1 of 13 Visit Report for 02/07/2023 Chief Complaint Document Details Patient Name: Date of Service: Laura Santiago. 02/07/2023 10:00 [place] Record Number: [id number] Patient Account Number: [id number] Date of Birth/Sex: Treating RN: [birth year] (77 y.o. [name] Primary Care Provider: [place] Other Clinician: [name] Referring Provider: Treating Provider/Extender: [name] Weeks in Treatment: 54 Information Obtained from: Patient Chief Complaint Left lower extremity wound Right toe wounds Left upper lateral thigh wounds Electronic Signature(s) Signed: 02/07/2023 12:14:48 PM By: Kalman Shan DO Entered By: Kalman Shan on 02/07/2023 11:41:58 -------------------------------------------------------------------------------- HPI Details Patient Name: Date of Service: Laura Santiago [name] 02/07/2023 10:00 [place] Record Number: [id number] Patient Account Number: [id number] Date of Birth/Sex: Treating RN: November 04, 1946 (77 y.o. [name] Primary Care Provider: [place] Other Clinician: [name] Referring Provider: Treating Provider/Extender: Kalman Shan [facility] Weeks in Treatment: 17 History of Present Illness HPI Description: Admission 7/27 Ms. Laura Santiago is a 77 year old female with a past medical history of ADHD, metastatic breast cancer, stage IV chronic kidney disease, history of DVT on Xarelto and chronic venous insufficiency that presents to the clinic for a chronic left lower extremity wound. She recently moved to [facility] 4 days ago. She was being followed by wound care center in [name]. She reports a 10-year history of wounds to her left lower extremity that eventually do heal with debridement and compression therapy. She states that the current wound reopened 4 months ago  and she is using Vaseline and Coban. She denies signs of infection. 8/3; patient presents for 1 week follow-up. She reports no issues or complaints today. She states she had vascular studies done in the last week. She denies signs of infection. She brought her little service dog with her today. 8/17; patient presents for follow-up. She has missed her last clinic appointment. She states she took the wrap off and attempted to rewrap her leg. She is having difficulty with transportation. She has her service dog with her today. Overall she feels well and reports improvement in wound healing. She denies signs of infection. She reports owning an old Velcro wrap compression and has this at her living facility 9/14; patient presents for follow-up. Patient states that over the past 2 to 3 weeks she developed toe wounds to her right foot. She attributes this to tight fitting [name], [name] ([id number]) [id number] Page 2 of 13 shoes. She subsequently developed cellulitis in the right leg and has been treated by doxycycline by her oncologist. She reports improvement in symptoms however continues to have some redness and swelling to this leg. T the left lower extremity patient has been having her wraps changed with home health twice weekly. She states that the [facility] is not helping control o the drainage. Other than that she has no issues or complaints today. She denies signs of infection to the left lower extremity. 9/21; patient presents for follow-up. She reports seeing infectious disease for her cellulitis. She reports no further management. She has home health that changes the wraps twice weekly. She has no issues or complaints today. She denies signs of infection. 10/5; patient presents for follow-up. She has no issues or complaints today. She denies signs of infection. She states that the right great toe has not been dressed by home health. 10/12; patient presents  for follow-up. She has no issues or complaints today.  She reports improvement in her wound healing. She has been using silver alginate to the right great toe wound. She denies signs of infection. 10/26; patient presents for follow-up. Home health did not have sorbact so they continued to use Hydrofera Blue under the wrap. She has been using silver alginate to the great toe wound however she did not have a dressing in place today. She currently denies signs of infection. 11/2; patient presents for follow-up. She has been using sorb act under the compression wrap. She reports using silver alginate to the toe wound again she does not have a dressing in place. She currently denies signs of infection. 11/23; patient presents for follow-up. Unfortunately she has missed her last 2 clinic appointments. She was last seen 3 weeks ago. She did her own compression wrap with Kerlix and Coban yesterday after seeing vein and vascular. She has not been dressing her right great toe wound. She currently denies signs of infection. 11/30; patient presents for 1 week follow-up. She states she changed her dressing last week prior to home health and use sorb act with Dakin's and Hydrofera Blue. Home health has changed the dressing as well and they have been using sorbact. T oday she reports increased redness to her right lower extremity. She has a history of cellulitis to this leg. She has been using silver alginate to the right great toe. Unfortunately she had an episode of diarrhea prior to coming in and had feces all over the right leg and to the wrap of her left leg. 12/7; patient presents for 1 week follow-up. She states that home health did not come out to change the dressing and she took it off yesterday. It is unclear if she is dressing the right toe wound. She denies signs of infection. 12/14; patient presents for 1 week follow-up. She has no issues or complaints today. 12/21; patient presents for follow-up. She  has no issues or complaints today. She denies signs of infection. 12/28/2021; patient presents for follow-up. She was hospitalized for sepsis secondary to right lower extremity cellulitis On 12/23. She states she is currently at a SNF. She states that she was started on doxycycline this morning for her right great toe swelling and redness. She is not sure what dressings have been done to her left lower extremity for the past 3 weeks. She says its been mainly gauze with an Santiago wrap. 1/25; patient presents for follow-up. She is still residing in a skilled nursing facility. She reports mild pain to the left lower extremity wound bed. She states she is going to see a podiatrist soon. 2/8; patient presents for follow-up. She has moved back to her residential community from her skilled nursing facility. She has no issues or complaints today. She denies signs of systemic infections. 2/15; patient presents for follow-up. He has no issues or complaints today. She denies systemic signs of infection. 2/22; patient presents for follow-up. She has no issues or complaints today. She denies signs of infection. 3/1; patient presents for follow-up. She states that home health came out the day after she was seen in our clinic and yesterday to do the wrap change. She denies signs of infection. She reports excoriated skin on the ankle. 3/8; patient presents for follow-up. She has no issues or complaints today. She denies signs of infection. 3/15; patient presents for follow-up. Home health has been coming out to change the dressings. She reports more tenderness to the wound site. She denies purulent drainage, increased warmth or erythema  to the area. 4/5; patient presents for follow-up. She has missed her last 2 clinic appointments. I have not seen her in 3 weeks. She was recently hospitalized for altered mental status. She was involuntarily committed. She was evaluated by psychiatry and deemed to have competency. There  was no specific cause of her altered mental status. It was concluded that her physical and mental health were declining due to her chronic medical conditions. Currently home health has been coming out for dressing changes. Patient has also been doing her own dressing changes. She reports more skin breakdown to the periwound and now has a new wound. She denies fever/chills. She reports continued tenderness to the wound site. 4/12; patient with significant venous insufficiency and a large wound on her left lower leg taking up about 80% of the circumference of her lower leg. Cultures of this grew MRSA and Pseudomonas. She had completed a course of ciprofloxacin now is starting doxycycline. She has been using Dakin's wet-to-dry and a Tubigrip. She has home health twice a week and we change it once. 4/19; patient presents for follow-up. She completed her course of doxycycline. She has been using Dakin's wet-to-dry dressing and Tubigrip. Home health changes the dressing twice weekly. Currently she has no issues or complaints. 4/26; patient presents for follow-up. At last clinic visit orders for home health were Iodosorb under compression therapy. Unfortunately they did not have the dressing and have been using Dakin's and gentamicin under the wrap. Patient currently denies signs of infection. She has no issues or complaints today. 5/3; patient presents for follow-up. Again Iodosorb has not been used under the compression therapy when home health comes out to change the wrap and dressing. They have been using Sorbact. It is unclear why this is happening since we send orders weekly to the agency. She denies signs of infection. Patient has not purchased the [place] antibiotics. We reached out to the company and they said they have been trying to contact her on a regular basis. We gave the patient the number to call to order the medication. 5/10; patient presents for follow-up. She has no issues or complaints  today. Again home health has not been using Iodosorb. Mepilex was on the wound bed. No other dressings noted. She brought in her Keystone antibiotics. She denies signs of infection. 5/17; patient presents for follow-up. Home health has come out twice since she was last seen. Joint well she has been using Keystone antibiotic with Sorbact under the compression wrap. She has no issues or complaints today. She denies signs of infection. 5/24; patient presents for follow-up. We have been using Keystone antibiotics with Sorbact under compression therapy. She is tolerating the treatment well. She is reporting improvement in wound healing. She denies signs of infection. 5/31; patient presents for follow-up. We continue to do [facility] antibiotics with Sorbact under compression therapy. She continues to report improvement in wound healing. Home health comes out and changes the dressing once weekly. 05-17-2022 upon evaluation today patient appears to be doing better in regard to her wound especially compared to the last time I saw her. Fortunately I do think ANATASIA, SCHIAVI ([id number]) [id number] Page 3 of 13 that she is seeing improvements. With that being said I do believe that she may be benefit from sharp debridement today to clear away some of the necrotic debris I discussed that with her as well. She is an amendable to that plan. Otherwise she is very pleased with how the [employer] is doing for her.  6/14; patient presents for follow-up. We have been using Keystone antibiotic with Sorbact and absorbent dressings under 3 layer compression. She has no issues or complaints today. She reports improvement in wound healing. She denies signs of infection. 6/21; patient presents for follow-up. We are continuing with [facility] antibiotic and Sorbact under 3 layer compression. Patient has no complaints. Continued wound healing is happening. She denies signs of infection. 6/28; patient  presents for follow-up. We have been using Keystone antibiotic with Sorbact under 3 layer compression. Usually home health comes out and changes the dressing twice a week. Unfortunately they did not go out to change the dressing. It is unclear why. Patient did not call them. She currently denies signs of infection. 7/5; patient presents for follow-up. We have been using Keystone antibiotic with calcium alginate under 3 layer compression. She reports improvement in wound healing. She denies signs of infection. Home health has come out to do dressing changes twice this past week. 7/12; patient presents for follow-up. We have been using Keystone antibiotic with calcium alginate under 3 layer compression. Patient states that home health came out once last week to change the dressing. She reports improvement in wound healing. She currently denies signs of infection. 7/19; patient presents for follow-up. We have been using Keystone antibiotic with calcium alginate under 3 layer compression. Home health came out once last week to change the dressing. She has no issues or complaints today. She denies signs of infection. 8/2; patient presents for follow-up. We have been using Keystone antibiotic with calcium alginate under 3 layer compression. Unfortunately she missed her appointment last week and home health did not come out to do dressing changes. Patient currently denies signs of infection. 8/9; patient presents for follow-up. We have been using Keystone with calcium alginate under 3 layer compression. She states that home health came out once last week. She currently denies signs of infection. Her wrap was completely wet. She states she was cleaning the top of the leg and water soaked down into the wrap. 8/16; patient presents for follow-up. We have been using Keystone with calcium alginate under 3 layer compression. She states that home health came out twice last week. She has no issues or complaints  today. 8/23; patient presents for follow-up. He has been using Keystone with calcium alginate under 3 layer compression. Home health came out twice last week. She denies signs of infection. 8/30; patient presents for follow-up. We have been using Keystone with calcium alginate under 3 layer compression. Home health came out once last week to change the dressing. Patient reports improvement in wound healing. She states she is almost done with her chemotherapy infusions and has 1 more left. 9/13; patient presents for follow-up. She has lost the capsules to her [facility] antibiotic which I believe is the vancomycin pills. She has her Zosyn powder today. We have been using Keystone antibiotic ointment with calcium alginate under 3 layer compression. She is concerned about systemic infection however her vitals are stable and there is no surrounding soft tissue infection. She would like to remain a patient in our wound care center however would like a second opinion for her wound care at another facility. She asked to be referred to [facility] wound care center. 9/20; patient presents for follow-up. She found her vancomycin capsules and brought in her complete Keystone antibiotic ointment set today. Unfortunately she has developed skin breakdown and Erythema to the right lower extremity With increased swelling. She states she went to a [employer]  Over the weekend and was on her feet for extended periods of time. She saw her oncologist yesterday who prescribed her doxycycline for her right lower extremity erythema. 9/27; patient presents for follow-up. We have been using Keystone antibiotic with Aquacel under 3 layer compression to the lower extremities bilaterally. When home health came and changed the wrap she secretly put coffee into the spray mix along with [name] antibiotic on her leg thinking the acidic component would better activate the zoysn (sonething she discussed with her microbiologist brother). She has  reported improvement in wound healing. 10/4; patient presents for follow-up. She has no issues or complaints today. We have been doing Aquacel and keystone under 3 layer compression to the lower extremities bilaterally. This morning she took the right lower extremity wrap off as it was uncomfortable. She has no open wounds to this leg. 10/11; patient presents for follow-up. We have been doing Aquacel with Keystone antibiotic ointment under 3 layer compression to the left lower extremity. She developed a small blister to the anterior aspect of the left leg noticed when the wrap was taken off on intake. She currently denies signs of infection. 10/18; patient presents for follow-up. We have been doing Aquacel with Keystone antibiotic ointment under 3 layer compression to the left lower extremity. There has been continued improvement in wound healing. She denies signs of infection. 10/25; patient arrives for treatment of venous insufficiency ulcers on her left lower leg both lateral and medial are remanence of apparently a circumferential wound. Much improved. We are using topicals Keystone and Aquacel Ag under 3 layer compression we continue to make good progress. The patient talk to me at some length with regards to different things she has on her forehead and her Peri orbital area for which she is apparently applying Lesslie. She feels that what ever we are treating on her wounds is a more systemic problem. I really was not able to get a handle on what she is talking about however I did caution her not to put the Caban in her eyes. 11/1; her wounds continue to improve she is using Keystone and Aquacel Ag G under 3 layer compression. Our intake nurse notes erythema and edema in the right leg. The patient has a litany of concerns with regards to a rash on her forehead or ears and other systemic complaints. She has an appointment with dermatology on November 11 11/8; patient presents for follow-up. We  have been using Keystone and Aquacel under 4-layer compression. She has no issues or complaints today. She reports improvement in wound healing. 11/15; patient presents for follow-up. We have been using Aquacel with Keystone antibiotic under 3 layer compression. Patient continues improvement in wound healing. 12/6; patient presents for follow-up. We have been using Aquacel with Keystone antibiotic ointment under 3 layer compression. Wounds appear well-healing. 12/13; patient presents for follow-up. We have been using Aquacel with Keystone antibiotic under 3 layer compression. She has no issues or complaints today. 12/27 left lateral medial ankle. Superficial wounds remain there is significantly improved we are using Keystone backed with Zetuvit under 4-layer compression 1/3; patient presents for follow-up. Her wounds appear well-healing. We have been using Aquacel Ag with Keystone antibiotic ointment under compression therapy. This should be a 3 layer compression. 1/17; patient presents for follow-up. Her wounds on her left lower extremity are well-healing. We are using Aquacel Ag and Keystone antibiotic ointment under compression therapy. She missed her last clinic appointment. She states that the wrap has been changed  twice weekly since she was last seen. Unfortunately she has developed increased warmth and redness to the right lower extremity consistent with cellulitis. She states this started a few days ago. 1/24; patient presents for follow-up. Her wounds on the left lower extremity are well-healing with Aquacel and Keystone antibiotic ointment under compression therapy. I prescribed Keflex at last clinic visit for cellulitis of the right lower extremity. Unfortunately this has not resolved. She did not follow-up with her PCP JARIA, HAPP ([id number]) [id number] Page 4 of 13 for contact our office about her symptoms. 1/31; patient presents for follow-up. We have  been using Aquacel and Keystone antibiotic ointment under compression therapy to the left lower extremity. Wounds appear well-healing. She has been taking clindamycin for the past week. Her symptoms have improved slightly with a decrease in erythema and warmth to the right lower extremity. She says her pain level has improved. However Symptoms have not completely resolved. She denies fever/chills, nausea/vomiting. 2/7; patient presents for follow-up. We have been using Aquacel Ag with Keystone antibiotic ointment under compression therapy to the left lower extremity. For the past week home health has not been using Keystone antibiotic ointment. She continues to take clindamycin. Her symptoms have improved greatly with the decrease in erythema and warmth to the right lower extremity. She denies systemic signs of infection. She has an area of skin breakdown to the right anterior leg. 2/14; Patient presents for follow-up. She has been using Hydrofera Blue to the right anterior leg under Tubigrip. It looks like Hydrofera Blue is also being used with Keystone to the left lower extremity under compression therapy. Order is for [place] Ag. Overall wounds appear well healing. She has no issues or complaints. 2/21; patient presents for follow-up. We have been using Hydrofera Blue under 3 layer compression to the lower extremities bilaterally. Her right lower extremity wounds have healed. She has a small open wound remaining to her left lateral leg. 2/28; patient presents for follow-up. We have been using Hydrofera Blue under 3 layer compression. Home health has changed the dressing to the left lower extremity however started the wrap at the ankle. Electronic Signature(s) Signed: 02/07/2023 12:14:48 PM By: Kalman Shan DO Entered By: Kalman Shan on 02/07/2023 11:49:39 -------------------------------------------------------------------------------- Physical Exam Details Patient Name: Date of  Service: Laura Santiago [name] 02/07/2023 10:00 [place] Medical Record Number: [id number] Patient Account Number: [id number] Date of Birth/Sex: Treating RN: [birth year] (77 y.o. [name] Primary Care Provider: [place] Other Clinician: [name] Referring Provider: Treating Provider/Extender: Kalman Shan [facility] Weeks in Treatment: 63 Constitutional . Cardiovascular . Psychiatric . Notes Small open area to the lateral left leg With granulation tissue. T the right lower extremity there are no open wounds. No increased warmth and erythema to the o lower extremity. Electronic Signature(s) Signed: 02/07/2023 12:14:48 PM By: Kalman Shan DO Entered By: Kalman Shan on 02/07/2023 11:50:29 [name] ([id number]) [phone].pdf Page 5 of 13 -------------------------------------------------------------------------------- Physician Orders Details Patient Name: Date of Service: Laura Santiago. 02/07/2023 10:00 [place] Record Number: [id number] Patient Account Number: [id number] Date of Birth/Sex: Treating RN: 05-06-1946 (77 y.o. [name] Primary Care Provider: [place] Other Clinician: [name] Referring Provider: Treating Provider/Extender: [name] Weeks in Treatment: 87 Verbal / Phone Orders: No Diagnosis Coding Follow-up Appointments Return Appointment in 1 week. Nurse Visit as needed [place]: [facility] [place]  [facility] for wound care. May utilize formulary equivalent dressing for wound treatment orders unless otherwise specified. Home Health Nurse may visit PRN to address patients wound care needs. - home health to see patient 2 times per week, patient to be seen at wound clinic once per week **Please direct any NON-WOUND related issues/requests for orders to patient's Primary Care Physician. **If  current dressing causes regression in wound condition, may D/C ordered dressing product/s and apply Normal Saline Moist Dressing daily until next [place] or Other MD appointment. **Notify Wound Healing Center of regression in wound condition at [phone]. Other Home Health Orders/Instructions: - Do not change 3 layer wrap for this week 02/07/23 - 02/14/23 Bathing/ Shower/ Hygiene May shower with wound dressing protected with water repellent cover or cast protector. No tub bath. Anesthetic (Use 'Patient Medications' Section for Anesthetic Order Entry) Lidocaine applied to wound bed Edema Control - Lymphedema / Segmental Compressive Device / Other Optional: One layer of unna paste to top of compression wrap (to act as an anchor). Patient to wear own Velcro compression garment. Remove compression stockings every night before going to bed and put on every morning when getting up. - juxtalite wrap for right lower leg Elevate, Exercise Daily and A void Standing for Long Periods of Time. Elevate legs to the level of the heart and pump ankles as often as possible Elevate leg(s) parallel to the floor when sitting. DO YOUR BEST to sleep in the bed at night. DO NOT sleep in your recliner. Long hours of sitting in a recliner leads to swelling of the legs and/or potential wounds on your backside. Additional Orders / Instructions Follow Nutritious Diet and Increase Protein Intake Medications-Please add to medication list. ntibiotic - gentamycin and Mupirocin to wound Topical A Wound Treatment Wound #8 - Lower Leg Wound Laterality: Left, Lateral Cleanser: Wound Cleanser 3 x Per Week/30 Days Discharge Instructions: Wash your hands with soap and water. Remove old dressing, discard into plastic bag and place into trash. Cleanse the wound with Wound Cleanser prior to applying a clean dressing using gauze sponges, not tissues or cotton balls. Do not scrub or use excessive force. Pat dry using  gauze sponges, not tissue or cotton balls. Topical: Triamcinolone Acetonide Cream, 0.1%, 15 (g) tube 3 x Per Week/30 Days Discharge Instructions: Mix 1:1 with Nystatin cream and apply to periwound and leg Prim Dressing: PolyMem MAX Silver Non-Adhesive Dressing, 8x8 (in/in) ary 3 x Per Week/30 Days Secondary Dressing: ABD Pad 5x9 (in/in) 3 x Per Week/30 Days Discharge Instructions: Cover with ABD pad Compression Wrap: 3-LAYER WRAP - Profore Lite LF 3 Multilayer Compression Bandaging System 3 x Per Week/30 Days Discharge Instructions: Apply 3 multi-layer wrap as prescribed. ALBINA, PERETZ ([id number]) [id number] Page 6 of 13 Electronic Signature(s) Signed: 02/07/2023 12:14:48 PM By: Kalman Shan DO Entered By: Kalman Shan on 02/07/2023 11:53:25 -------------------------------------------------------------------------------- Problem List Details Patient Name: Date of Service: Laura Santiago [name] 02/07/2023 10:00 A M Medical Record Number: [id number] Patient Account Number: [id number] Date of Birth/Sex: Treating RN: 12-Nov-1946 (77 y.o. [name] Primary Care Provider: [place] Other Clinician: [name] Referring Provider: Treating Provider/Extender: Kalman Shan [facility] Weeks in Treatment: 45 Active Problems ICD-10 Encounter Code Description Active Date MDM Diagnosis [phone] Non-pressure chronic ulcer of other part of left lower leg with fat layer exposed11/23/2022 No Yes 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 11/02/2021 No Yes I87.311 Chronic venous hypertension (idiopathic) with  ulcer of right lower extremity 08/30/2022 No Yes I87.2 Venous insufficiency (chronic) (peripheral) 07/06/2021 No Yes Z79.01 Long term (current) use of anticoagulants 07/06/2021 No Yes I10 Essential (primary) hypertension 07/06/2021 No Yes 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  07/06/2021 No Yes L03.115 Cellulitis of right lower limb 12/27/2022 No Yes S81.801A Unspecified open wound, right lower leg, initial encounter 01/17/2023 No Yes Inactive Problems ICD-10 Code Description Active Date Inactive Date ADRIANNE, MAREADY ([id number]) [id number] Page 7 of 13 [phone] Unspecified open wound, left lower leg, initial encounter 07/06/2021 07/06/2021 S91.101A Unspecified open wound of right great toe without damage to nail, initial encounter 08/24/2021 08/24/2021 S91.104A Unspecified open wound of right lesser toe(s) without damage to nail, initial encounter 08/24/2021 08/24/2021 Resolved Problems ICD-10 Code Description Active Date Resolved Date S91.104D Unspecified open wound of right lesser toe(s) without damage to nail, subsequent 08/31/2021 08/31/2021 encounter S91.201D Unspecified open wound of right great toe with damage to nail, subsequent encounter 08/31/2021 08/31/2021 Electronic Signature(s) Signed: 02/07/2023 12:14:48 PM By: Kalman Shan DO Entered By: Kalman Shan on 02/07/2023 11:41:55 -------------------------------------------------------------------------------- Progress Note Details Patient Name: Date of Service: Laura Santiago [name] 02/07/2023 10:00 [place] Record Number: [id number] Patient Account Number: [id number] Date of Birth/Sex: Treating RN: [birth year] (77 y.o. [name] Primary Care Provider: [place] Other Clinician: [name] Referring Provider: Treating Provider/Extender: [name] [facility] Weeks in Treatment: 60 Subjective Chief Complaint Information obtained from Patient Left lower extremity wound Right toe wounds Left upper lateral thigh wounds History of Present Illness (HPI) Admission 7/27 Ms. Breck Chrisley is a 77 year old female with a past medical history of ADHD, metastatic breast cancer, stage IV chronic kidney disease, history of DVT on Xarelto and  chronic venous insufficiency that presents to the clinic for a chronic left lower extremity wound. She recently moved to [facility] 4 days ago. She was being followed by wound care center in [name]. She reports a 10-year history of wounds to her left lower extremity that eventually do heal with debridement and compression therapy. She states that the current wound reopened 4 months ago and she is using Vaseline and Coban. She denies signs of infection. 8/3; patient presents for 1 week follow-up. She reports no issues or complaints today. She states she had vascular studies done in the last week. She denies signs of infection. She brought her little service dog with her today. 8/17; patient presents for follow-up. She has missed her last clinic appointment. She states she took the wrap off and attempted to rewrap her leg. She is having difficulty with transportation. She has her service dog with her today. Overall she feels well and reports improvement in wound healing. She denies signs of infection. She reports owning an old Velcro wrap compression and has this at her living facility 9/14; patient presents for follow-up. Patient states that over the past 2 to 3 weeks she developed toe wounds to her right foot. She attributes this to tight fitting shoes. She subsequently developed cellulitis in the right leg and has been treated by doxycycline by her oncologist. She reports improvement in symptoms however continues to have some redness and swelling to this leg. T the left lower extremity patient has been having her wraps changed with home health twice weekly. She states that the [facility] is not helping control o the drainage. Other than that she has no issues or complaints today. She denies signs of infection to the left lower extremity. 9/21;  patient presents for follow-up. She reports seeing infectious disease for her cellulitis. She reports no further management. She has home  health that changes the wraps twice weekly. She has no issues or complaints today. She denies signs of infection. SHALETTA, CORDERO ([id number]) [id number] Page 8 of 13 10/5; patient presents for follow-up. She has no issues or complaints today. She denies signs of infection. She states that the right great toe has not been dressed by home health. 10/12; patient presents for follow-up. She has no issues or complaints today. She reports improvement in her wound healing. She has been using silver alginate to the right great toe wound. She denies signs of infection. 10/26; patient presents for follow-up. Home health did not have sorbact so they continued to use Hydrofera Blue under the wrap. She has been using silver alginate to the great toe wound however she did not have a dressing in place today. She currently denies signs of infection. 11/2; patient presents for follow-up. She has been using sorb act under the compression wrap. She reports using silver alginate to the toe wound again she does not have a dressing in place. She currently denies signs of infection. 11/23; patient presents for follow-up. Unfortunately she has missed her last 2 clinic appointments. She was last seen 3 weeks ago. She did her own compression wrap with Kerlix and Coban yesterday after seeing vein and vascular. She has not been dressing her right great toe wound. She currently denies signs of infection. 11/30; patient presents for 1 week follow-up. She states she changed her dressing last week prior to home health and use sorb act with Dakin's and Hydrofera Blue. Home health has changed the dressing as well and they have been using sorbact. T oday she reports increased redness to her right lower extremity. She has a history of cellulitis to this leg. She has been using silver alginate to the right great toe. Unfortunately she had an episode of diarrhea prior to coming in and had feces all over  the right leg and to the wrap of her left leg. 12/7; patient presents for 1 week follow-up. She states that home health did not come out to change the dressing and she took it off yesterday. It is unclear if she is dressing the right toe wound. She denies signs of infection. 12/14; patient presents for 1 week follow-up. She has no issues or complaints today. 12/21; patient presents for follow-up. She has no issues or complaints today. She denies signs of infection. 12/28/2021; patient presents for follow-up. She was hospitalized for sepsis secondary to right lower extremity cellulitis On 12/23. She states she is currently at a SNF. She states that she was started on doxycycline this morning for her right great toe swelling and redness. She is not sure what dressings have been done to her left lower extremity for the past 3 weeks. She says its been mainly gauze with an Santiago wrap. 1/25; patient presents for follow-up. She is still residing in a skilled nursing facility. She reports mild pain to the left lower extremity wound bed. She states she is going to see a podiatrist soon. 2/8; patient presents for follow-up. She has moved back to her residential community from her skilled nursing facility. She has no issues or complaints today. She denies signs of systemic infections. 2/15; patient presents for follow-up. He has no issues or complaints today. She denies systemic signs of infection. 2/22; patient presents for follow-up. She has no issues or complaints today. She  denies signs of infection. 3/1; patient presents for follow-up. She states that home health came out the day after she was seen in our clinic and yesterday to do the wrap change. She denies signs of infection. She reports excoriated skin on the ankle. 3/8; patient presents for follow-up. She has no issues or complaints today. She denies signs of infection. 3/15; patient presents for follow-up. Home health has been coming out to change the  dressings. She reports more tenderness to the wound site. She denies purulent drainage, increased warmth or erythema to the area. 4/5; patient presents for follow-up. She has missed her last 2 clinic appointments. I have not seen her in 3 weeks. She was recently hospitalized for altered mental status. She was involuntarily committed. She was evaluated by psychiatry and deemed to have competency. There was no specific cause of her altered mental status. It was concluded that her physical and mental health were declining due to her chronic medical conditions. Currently home health has been coming out for dressing changes. Patient has also been doing her own dressing changes. She reports more skin breakdown to the periwound and now has a new wound. She denies fever/chills. She reports continued tenderness to the wound site. 4/12; patient with significant venous insufficiency and a large wound on her left lower leg taking up about 80% of the circumference of her lower leg. Cultures of this grew MRSA and Pseudomonas. She had completed a course of ciprofloxacin now is starting doxycycline. She has been using Dakin's wet-to-dry and a Tubigrip. She has home health twice a week and we change it once. 4/19; patient presents for follow-up. She completed her course of doxycycline. She has been using Dakin's wet-to-dry dressing and Tubigrip. Home health changes the dressing twice weekly. Currently she has no issues or complaints. 4/26; patient presents for follow-up. At last clinic visit orders for home health were Iodosorb under compression therapy. Unfortunately they did not have the dressing and have been using Dakin's and gentamicin under the wrap. Patient currently denies signs of infection. She has no issues or complaints today. 5/3; patient presents for follow-up. Again Iodosorb has not been used under the compression therapy when home health comes out to change the wrap and dressing. They have been using  Sorbact. It is unclear why this is happening since we send orders weekly to the agency. She denies signs of infection. Patient has not purchased the [place] antibiotics. We reached out to the company and they said they have been trying to contact her on a regular basis. We gave the patient the number to call to order the medication. 5/10; patient presents for follow-up. She has no issues or complaints today. Again home health has not been using Iodosorb. Mepilex was on the wound bed. No other dressings noted. She brought in her Keystone antibiotics. She denies signs of infection. 5/17; patient presents for follow-up. Home health has come out twice since she was last seen. Joint well she has been using Keystone antibiotic with Sorbact under the compression wrap. She has no issues or complaints today. She denies signs of infection. 5/24; patient presents for follow-up. We have been using Keystone antibiotics with Sorbact under compression therapy. She is tolerating the treatment well. She is reporting improvement in wound healing. She denies signs of infection. 5/31; patient presents for follow-up. We continue to do [facility] antibiotics with Sorbact under compression therapy. She continues to report improvement in wound healing. Home health comes out and changes the dressing once weekly. 05-17-2022  upon evaluation today patient appears to be doing better in regard to her wound especially compared to the last time I saw her. Fortunately I do think that she is seeing improvements. With that being said I do believe that she may be benefit from sharp debridement today to clear away some of the necrotic debris I discussed that with her as well. She is an amendable to that plan. Otherwise she is very pleased with how the [employer] is doing for her. 6/14; patient presents for follow-up. We have been using Keystone antibiotic with Sorbact and absorbent dressings under 3 layer compression. She has no issues or  complaints today. She reports improvement in wound healing. She denies signs of infection. 6/21; patient presents for follow-up. We are continuing with [facility] antibiotic and Sorbact under 3 layer compression. Patient has no complaints. Continued TANYELL, BRISCOE ([id number]) [id number] Page 9 of 13 wound healing is happening. She denies signs of infection. 6/28; patient presents for follow-up. We have been using Keystone antibiotic with Sorbact under 3 layer compression. Usually home health comes out and changes the dressing twice a week. Unfortunately they did not go out to change the dressing. It is unclear why. Patient did not call them. She currently denies signs of infection. 7/5; patient presents for follow-up. We have been using Keystone antibiotic with calcium alginate under 3 layer compression. She reports improvement in wound healing. She denies signs of infection. Home health has come out to do dressing changes twice this past week. 7/12; patient presents for follow-up. We have been using Keystone antibiotic with calcium alginate under 3 layer compression. Patient states that home health came out once last week to change the dressing. She reports improvement in wound healing. She currently denies signs of infection. 7/19; patient presents for follow-up. We have been using Keystone antibiotic with calcium alginate under 3 layer compression. Home health came out once last week to change the dressing. She has no issues or complaints today. She denies signs of infection. 8/2; patient presents for follow-up. We have been using Keystone antibiotic with calcium alginate under 3 layer compression. Unfortunately she missed her appointment last week and home health did not come out to do dressing changes. Patient currently denies signs of infection. 8/9; patient presents for follow-up. We have been using Keystone with calcium alginate under 3 layer compression. She  states that home health came out once last week. She currently denies signs of infection. Her wrap was completely wet. She states she was cleaning the top of the leg and water soaked down into the wrap. 8/16; patient presents for follow-up. We have been using Keystone with calcium alginate under 3 layer compression. She states that home health came out twice last week. She has no issues or complaints today. 8/23; patient presents for follow-up. He has been using Keystone with calcium alginate under 3 layer compression. Home health came out twice last week. She denies signs of infection. 8/30; patient presents for follow-up. We have been using Keystone with calcium alginate under 3 layer compression. Home health came out once last week to change the dressing. Patient reports improvement in wound healing. She states she is almost done with her chemotherapy infusions and has 1 more left. 9/13; patient presents for follow-up. She has lost the capsules to her [facility] antibiotic which I believe is the vancomycin pills. She has her Zosyn powder today. We have been using Keystone antibiotic ointment with calcium alginate under 3 layer compression. She is concerned about systemic infection  however her vitals are stable and there is no surrounding soft tissue infection. She would like to remain a patient in our wound care center however would like a second opinion for her wound care at another facility. She asked to be referred to [facility] wound care center. 9/20; patient presents for follow-up. She found her vancomycin capsules and brought in her complete Keystone antibiotic ointment set today. Unfortunately she has developed skin breakdown and Erythema to the right lower extremity With increased swelling. She states she went to a pow wow Over the weekend and was on her feet for extended periods of time. She saw her oncologist yesterday who prescribed her doxycycline for her right lower extremity erythema. 9/27;  patient presents for follow-up. We have been using Keystone antibiotic with Aquacel under 3 layer compression to the lower extremities bilaterally. When home health came and changed the wrap she secretly put coffee into the spray mix along with [facility] antibiotic on her leg thinking the acidic component would better activate the zoysn (sonething she discussed with her microbiologist brother). She has reported improvement in wound healing. 10/4; patient presents for follow-up. She has no issues or complaints today. We have been doing Aquacel and keystone under 3 layer compression to the lower extremities bilaterally. This morning she took the right lower extremity wrap off as it was uncomfortable. She has no open wounds to this leg. 10/11; patient presents for follow-up. We have been doing Aquacel with Keystone antibiotic ointment under 3 layer compression to the left lower extremity. She developed a small blister to the anterior aspect of the left leg noticed when the wrap was taken off on intake. She currently denies signs of infection. 10/18; patient presents for follow-up. We have been doing Aquacel with Keystone antibiotic ointment under 3 layer compression to the left lower extremity. There has been continued improvement in wound healing. She denies signs of infection. 10/25; patient arrives for treatment of venous insufficiency ulcers on her left lower leg both lateral and medial are remanence of apparently a circumferential wound. Much improved. We are using topicals Keystone and Aquacel Ag under 3 layer compression we continue to make good progress. The patient talk to me at some length with regards to different things she has on her forehead and her Peri orbital area for which she is apparently applying [place]. She feels that what ever we are treating on her wounds is a more systemic problem. I really was not able to get a handle on what she is talking about however I did caution her not to  put the [place] in her eyes. 11/1; her wounds continue to improve she is using Keystone and Aquacel Ag G under 3 layer compression. Our intake nurse notes erythema and edema in the right leg. The patient has a litany of concerns with regards to a rash on her forehead or ears and other systemic complaints. She has an appointment with dermatology on November 11 11/8; patient presents for follow-up. We have been using Keystone and Aquacel under 4-layer compression. She has no issues or complaints today. She reports improvement in wound healing. 11/15; patient presents for follow-up. We have been using Aquacel with Keystone antibiotic under 3 layer compression. Patient continues improvement in wound healing. 12/6; patient presents for follow-up. We have been using Aquacel with Keystone antibiotic ointment under 3 layer compression. Wounds appear well-healing. 12/13; patient presents for follow-up. We have been using Aquacel with Keystone antibiotic under 3 layer compression. She has no issues or complaints  today. 12/27 left lateral medial ankle. Superficial wounds remain there is significantly improved we are using Keystone backed with Zetuvit under 4-layer compression 1/3; patient presents for follow-up. Her wounds appear well-healing. We have been using Aquacel Ag with Keystone antibiotic ointment under compression therapy. This should be a 3 layer compression. 1/17; patient presents for follow-up. Her wounds on her left lower extremity are well-healing. We are using Aquacel Ag and Keystone antibiotic ointment under compression therapy. She missed her last clinic appointment. She states that the wrap has been changed twice weekly since she was last seen. Unfortunately she has developed increased warmth and redness to the right lower extremity consistent with cellulitis. She states this started a few days ago. 1/24; patient presents for follow-up. Her wounds on the left lower extremity are well-healing  with Aquacel and Keystone antibiotic ointment under compression therapy. I prescribed Keflex at last clinic visit for cellulitis of the right lower extremity. Unfortunately this has not resolved. She did not follow-up with her PCP for contact our office about her symptoms. 1/31; patient presents for follow-up. We have been using Aquacel and Keystone antibiotic ointment under compression therapy to the left lower extremity. Wounds appear well-healing. She has been taking clindamycin for the past week. Her symptoms have improved slightly with a decrease in erythema and warmth to the right lower extremity. She says her pain level has improved. However Symptoms have not completely resolved. She denies fever/chills, nausea/vomiting. KEELAH, GOLDFIELD ([id number]) [id number] Page 10 of 13 2/7; patient presents for follow-up. We have been using Aquacel Ag with Keystone antibiotic ointment under compression therapy to the left lower extremity. For the past week home health has not been using Keystone antibiotic ointment. She continues to take clindamycin. Her symptoms have improved greatly with the decrease in erythema and warmth to the right lower extremity. She denies systemic signs of infection. She has an area of skin breakdown to the right anterior leg. 2/14; Patient presents for follow-up. She has been using Hydrofera Blue to the right anterior leg under Tubigrip. It looks like Hydrofera Blue is also being used with Keystone to the left lower extremity under compression therapy. Order is for [place]. Overall wounds appear well healing. She has no issues or complaints. 2/21; patient presents for follow-up. We have been using Hydrofera Blue under 3 layer compression to the lower extremities bilaterally. Her right lower extremity wounds have healed. She has a small open wound remaining to her left lateral leg. 2/28; patient presents for follow-up. We have been using  Hydrofera Blue under 3 layer compression. Home health has changed the dressing to the left lower extremity however started the wrap at the ankle. Objective Constitutional Vitals Time Taken: 10:04 AM, Height: 66 in, Weight: 153 lbs, BMI: 24.7, Temperature: 98.1 F, Pulse: 62 bpm, Respiratory Rate: 18 breaths/min, Blood Pressure: 156/79 mmHg. General Notes: Small open area to the lateral left leg With granulation tissue. T the right lower extremity there are no open wounds. No increased warmth and o erythema to the lower extremity. Integumentary (Hair, Skin) Wound #8 status is Open. Original cause of wound was Pressure Injury. The date acquired was: 10/18/2022. The wound has been in treatment 16 weeks. The wound is located on the Left,Lateral Lower Leg. The wound measures 0.7cm length x 0.9cm width x 0.1cm depth; 0.495cm^2 area and 0.049cm^3 volume. There is a medium amount of serosanguineous drainage noted. 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 I recommended polymem with antibiotic ointment under 3 layer compression to the left lower extremity. Can hold off on home health for a week. Procedures Wound #8 Pre-procedure diagnosis of Wound #8 is a Venous Leg Ulcer located on the Left,Lateral Lower Leg . There was a Three Layer Compression Therapy Procedure with a pre-treatment ABI of 1.5 by [name]. Post procedure Diagnosis Wound #8: Same as Pre-Procedure Plan Follow-up  Appointments: Return Appointment in 1 week. Nurse Visit as needed Home Health: [place]: Rocky Morel [phone] [place] for wound care. May utilize formulary equivalent dressing for wound treatment orders unless otherwise specified. Home Health Nurse may visit PRN to address patientoos wound care needs. - home health to see patient 2 times per week, patient to be seen at wound clinic once per week FLORNCE, AERY ([id number]) [id number] Page 11 of 13 **Please direct any NON-WOUND related issues/requests for orders to patient's Primary Care Physician. **If current dressing causes regression in wound condition, may D/C ordered dressing product/s and apply Normal Saline Moist Dressing daily until next [place] or Other MD appointment. **Notify [facility] of regression in wound condition at [phone]. Other Home Health Orders/Instructions: - Do not change 3 layer wrap for this week 02/07/23 - 02/14/23 Bathing/ Shower/ Hygiene: May shower with wound dressing protected with water repellent cover or cast protector. No tub bath. Anesthetic (Use 'Patient Medications' Section for Anesthetic Order Entry): Lidocaine applied to wound bed Edema Control - Lymphedema / Segmental Compressive Device / Other: Optional: One layer of unna paste to top of compression wrap (to act as an anchor). Patient to wear own Velcro compression garment. Remove compression stockings every night before going to bed and put on every morning when getting up. - juxtalite wrap for right lower leg Elevate, Exercise Daily and Avoid Standing for Long Periods of Time. Elevate legs to the level of the heart and pump ankles as often as possible Elevate leg(s) parallel to the floor when sitting. DO YOUR BEST to sleep in the bed at night. DO NOT sleep in your recliner. Long hours of sitting in a recliner leads to swelling of the legs and/or potential wounds on  your backside. Additional Orders / Instructions: Follow Nutritious Diet and Increase Protein Intake Medications-Please add to medication list.: Topical Antibiotic - gentamycin and Mupirocin to wound WOUND #8: - Lower Leg Wound Laterality: Left, Lateral Cleanser: Wound Cleanser 3 x Per Week/30 Days Discharge Instructions: Wash your hands with soap and water. Remove old dressing, discard into plastic bag and place into trash. Cleanse the wound with Wound Cleanser prior to applying a clean dressing using gauze sponges, not tissues or cotton balls. Do not scrub or use excessive force. Pat dry using gauze sponges, not tissue or cotton balls. Topical: Triamcinolone Acetonide Cream, 0.1%, 15 (g) tube 3 x Per Week/30 Days Discharge Instructions: Mix 1:1 with Nystatin cream and apply to periwound and leg Prim Dressing: PolyMem MAX Silver Non-Adhesive Dressing, 8x8 (in/in) 3 x Per Week/30 Days ary Secondary Dressing: ABD Pad 5x9 (in/in) 3 x Per Week/30 Days Discharge Instructions: Cover with ABD pad Com pression Wrap: 3-LAYER WRAP - Profore Lite LF  3 Multilayer Compression Bandaging System 3 x Per Week/30 Days Discharge Instructions: Apply 3 multi-layer wrap as prescribed. 1. polymem with antibiotic ointment under 3 layer compressionooleft lower extremity 2. Follow-up in 1 week Electronic Signature(s) Signed: 02/07/2023 12:14:48 PM By: Kalman Shan DO Entered By: Kalman Shan on 02/07/2023 11:53:03 -------------------------------------------------------------------------------- ROS/PFSH Details Patient Name: Date of Service: [name], BO [name] 02/07/2023 10:00 [place] Record Number: [id number] Patient Account Number: [id number] Date of Birth/Sex: Treating RN: [birth year] (77 y.o. [name] Primary Care Provider: [place] Other Clinician: [name] Referring Provider: Treating Provider/Extender: [name] Weeks in Treatment:  6 Information Obtained From Patient Eyes Medical History: Negative for: Cataracts; Glaucoma; Optic Neuritis Ear/Nose/Mouth/Throat Medical History: Negative for: Chronic sinus problems/congestion; Middle ear problems BERENIZE, BARTHA ([id number]) [id number] Page 12 of 13 Hematologic/Lymphatic Medical History: Negative for: Anemia; Hemophilia; Human Immunodeficiency Virus; Lymphedema; Sickle Cell Disease Respiratory Medical History: Negative for: Aspiration; Asthma; Chronic Obstructive Pulmonary Disease (COPD); Pneumothorax; Sleep Apnea; Tuberculosis Cardiovascular Medical History: Positive for: Hypertension Negative for: Angina; Arrhythmia; Congestive Heart Failure; Coronary Artery Disease; Deep Vein Thrombosis; Hypotension; Myocardial Infarction; Peripheral Arterial Disease; Peripheral Venous Disease; Phlebitis; Vasculitis Gastrointestinal Medical History: Negative for: Cirrhosis ; Colitis; Crohns; Hepatitis A; Hepatitis B; Hepatitis C Endocrine Medical History: Negative for: Type I Diabetes; Type II Diabetes Genitourinary Medical History: Negative for: End Stage Renal Disease Immunological Medical History: Negative for: Lupus Erythematosus; Raynauds; Scleroderma Integumentary (Skin) Medical History: Negative for: History of Burn; History of pressure wounds Musculoskeletal Medical History: Positive for: Osteoarthritis Negative for: Gout; Rheumatoid Arthritis; Osteomyelitis Oncologic Medical History: Positive for: Received Chemotherapy; Received Radiation Past Medical History Notes: breast cancer Immunizations Pneumococcal Vaccine: Received Pneumococcal Vaccination: No Implantable Devices None Family and Social History 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 02/07/2023 Medical Record Number: [id number] Patient Account Number: [id number] Date of Birth/Sex: Treating RN: 14-Jan-1946 (77 y.o. [name] Primary Care Provider: [place] Other Clinician: [name] Referring Provider: Treating Provider/Extender: [name] Weeks in Treatment: 18 Diagnosis Coding ICD-10 Codes Code Description [phone] 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 I87.311 Chronic venous hypertension (idiopathic) with ulcer of right lower extremity I87.2 Venous insufficiency (chronic) (peripheral) Z79.01 Long term (current) use of anticoagulants I10 Essential (primary) hypertension 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 Cellulitis of right lower limb S81.801A Unspecified open wound, right lower leg, initial encounter Facility Procedures : CPT4 Code: [id number] Description: (Facility Use Only) 29581LT - [place] LWR LT LEG Modifier: Quantity: 1 Physician Procedures : CPT4 Code Description Modifier [id number] - WC PHYS LEVEL 3 - EST PT ICD-10 Diagnosis Description 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.312 Chronic venous hypertension (idiopathic) with ulcer  of left lower extremity Quantity: 1 Electronic Signature(s) Signed: 02/07/2023 12:14:48 PM By: Kalman Shan DO Entered By: Kalman Shan on 02/07/2023 11:53:17

## 2023-02-12 ENCOUNTER — Other Ambulatory Visit: Payer: Self-pay | Admitting: Oncology

## 2023-02-12 NOTE — Progress Notes (Signed)
Laura Santiago, Laura Santiago ([id number]) [id number] Page 1 of 9 Visit Report for 02/07/2023 Arrival Information Details Patient Name: Date of Service: Laura Santiago. 02/07/2023 10:00 Laura Santiago Record Number: [id number] Patient Account Number: [id number] Date of Birth/Sex: Treating RN: 06-26-1946 (77 y.o. Laura Santiago Primary Care Laura Santiago: Laura Santiago Other Clinician: Massie Santiago Referring Laura Santiago: Treating Laura Santiago/Extender: Laura Santiago [facility] Weeks in Treatment: 39 Visit Information History Since Last Visit All ordered tests and consults were completed: No Patient Arrived: Laura Santiago Added or deleted any medications: No Arrival Time: 10:03 Any new allergies or adverse reactions: No Transfer Assistance: None Had a fall or experienced change in No Patient Identification Verified: Yes activities of daily living that may affect Secondary Verification Process Completed: Yes risk of falls: Patient Requires Transmission-Based No Signs or symptoms of abuse/neglect since last visito No Precautions: Hospitalized since last visit: No Patient Has Alerts: Yes Implantable device outside of the clinic excluding No Patient Alerts: PT HAS SERVICE cellular tissue based products placed in the center ANIMAL since last visit: ABI 07/11/21 Has Dressing in Place as Prescribed: Yes R) 1.16 L) 1.27 Has Compression in Place as Prescribed: Yes Pain Present Now: No Electronic Signature(s) Signed: 02/09/2023 1:35:31 PM By: Laura Santiago Entered By: Laura Santiago on 02/07/2023 10:03:56 -------------------------------------------------------------------------------- Clinic Level of Care Assessment Details Patient Name: Date of Service: Laura Santiago. 02/07/2023 10:00 [place] Record Number: [id number] Patient Account Number: [id number] Date of Birth/Sex: Treating RN: 01-09-1946 (77 y.o. Laura Santiago Primary Care Alylah Blakney: [place] Other Clinician: Massie Santiago Referring Laura Santiago: Treating Wilbern Pennypacker/Extender: Laura Santiago [facility] Weeks in Treatment: 70 Clinic Level of Care Assessment Items TOOL 1 Quantity Score '[]'$  - 0 Use when EandM and Procedure is performed on INITIAL visit ASSESSMENTS - Nursing Assessment / Reassessment '[]'$  - 0 General Physical Exam (combine w/ comprehensive assessment (listed just below) when performed on new pt. [address]VER[address]HA, Santiago ([id number]) [id number] Page 2 of 9 '[]'$  - 0 Comprehensive Assessment (HX, ROS, Risk Assessments, Wounds Hx, etc.) ASSESSMENTS - Wound and Skin Assessment / Reassessment '[]'$  - 0 Dermatologic / Skin Assessment (not related to wound area) ASSESSMENTS - Ostomy and/or Continence Assessment and Care '[]'$  - 0 Incontinence Assessment and Management '[]'$  - 0 Ostomy Care Assessment and Management (repouching, etc.) PROCESS - Coordination of Care '[]'$  - 0 Simple Patient / Family Education for ongoing care '[]'$  - 0 Complex (extensive) Patient / Family Education for ongoing care '[]'$  - 0 Staff obtains Programmer, systems, Records, T Results / Process Orders est '[]'$  - 0 Staff telephones HHA, Nursing Homes / Clarify orders / etc '[]'$  - 0 Routine Transfer to another Facility (non-emergent condition) '[]'$  - 0 Routine Hospital Admission (non-emergent condition) '[]'$  - 0 New Admissions / Biomedical engineer / Ordering NPWT Apligraf, etc. , '[]'$  - 0 Emergency Hospital Admission (emergent condition) PROCESS - Special Needs '[]'$  - 0 Pediatric / Minor Patient Management '[]'$  - 0 Isolation Patient Management '[]'$  - 0 Hearing / Language / Visual special needs '[]'$  - 0 Assessment of Community assistance (transportation, D/C planning, etc.) '[]'$  - 0 Additional assistance / Altered mentation '[]'$  - 0 Support Surface(s) Assessment (bed, cushion, seat, etc.) INTERVENTIONS - Miscellaneous '[]'$  - 0 External ear exam '[]'$  - 0 Patient Transfer (multiple staff  / Civil Service fast streamer / Similar devices) '[]'$  - 0 Simple Staple / Suture removal (25 or less) '[]'$  - 0 Complex Staple / Suture removal (26 or  more) '[]'$  - 0 Hypo/Hyperglycemic Management (do not check if billed separately) '[]'$  - 0 Ankle / Brachial Index (ABI) - do not check if billed separately Has the patient been seen at the hospital within the last three years: Yes Total Score: 0 Level Of Care: ____ Electronic Signature(s) Signed: 02/09/2023 1:35:31 PM By: Laura Santiago Entered By: Laura Santiago on 02/07/2023 10:54:13 -------------------------------------------------------------------------------- Compression Therapy Details Patient Name: Date of Service: Laura Jewett [name] 02/07/2023 10:00 A M Medical Record Number: [id number] Patient Account Number: [id number] Date of Birth/Sex: Treating RN: 1946/06/09 (77 y.o. Laura Santiago Primary Care Tifanny Santiago: [place] Other Clinician: Dejae, Santiago ([id number]) [id number] Page 3 of 9 Referring Laura Santiago: Treating Laura Santiago/Extender: Laura Santiago [facility] Weeks in Treatment: 32 Compression Therapy Performed for Wound Assessment: Wound #8 Left,Lateral Lower Leg Performed By: Laura Santiago, Laura Santiago, Compression Type: Three Layer Pre Treatment ABI: 1.5 Santiago Procedure Diagnosis Same as Pre-procedure Electronic Signature(s) Signed: 02/09/2023 1:35:31 PM By: Laura Santiago Entered By: Laura Santiago on 02/07/2023 10:46:32 -------------------------------------------------------------------------------- Encounter Discharge Information Details Patient Name: Date of Service: Laura Santiago, Laura Rubenstein [name] 02/07/2023 10:00 [place] Record Number: [id number] Patient Account Number: [id number] Date of Birth/Sex: Treating RN: Oct 20, 1946 (77 y.o. Laura Santiago Primary Care Latavion Halls: [place] Other Clinician: Massie Santiago Referring Laura Santiago: Treating Laura Santiago/Extender: Laura Santiago [facility] Weeks in Treatment: 61 Encounter Discharge Information Items Discharge Condition: Stable Ambulatory Status: Stretcher Discharge Destination: Home Transportation: Other Accompanied By: self Schedule Follow-up Appointment: Yes Clinical Summary of Care: Electronic Signature(s) Signed: 02/09/2023 1:35:31 PM By: Laura Santiago Entered By: Laura Santiago on 02/07/2023 12:24:20 -------------------------------------------------------------------------------- Lower Extremity Assessment Details Patient Name: Date of Service: Laura Santiago. 02/07/2023 10:00 A M Medical Record Number: [id number] Patient Account Number: [id number] Date of Birth/Sex: Treating RN: 01/24/1946 (77 y.o. Laura Santiago Primary Care Nikira Kushnir: [place] Other Clinician: Massie Santiago Referring Avaree Gilberti: Treating Quantel Mcinturff/Extender: Kalman Shan [facility] Weeks in Treatment: [address], [place] M ([id number]) [id number] Page 4 of 9 Edema Assessment Assessed: [Left: Yes] [Right: No] Edema: [Left: N] [Right: o] Calf Left: Right: Point of Measurement: 35 cm From Medial Instep 32.3 cm Ankle Left: Right: Point of Measurement: 12 cm From Medial Instep 21.5 cm Vascular Assessment Pulses: Dorsalis Pedis Palpable: [Left:Yes] Electronic Signature(s) Signed: 02/08/2023 6:12:13 PM By: Gretta Cool, BSN, RN, CWS, [name] RN, BSN Signed: 02/09/2023 1:35:31 PM By: Laura Santiago Entered By: Laura Santiago on 02/07/2023 10:16:37 -------------------------------------------------------------------------------- Multi Wound Chart Details Patient Name: Date of Service: Laura Jewett [name] 02/07/2023 10:00 A M Medical Record Number: [id number] Patient Account Number: [id number] Date of Birth/Sex: Treating RN: 09-21-1946 (77 y.o. Laura Santiago Primary Care Semir Brill: [place] Other Clinician: Massie Santiago Referring Marx Doig: Treating  Ebbie Sorenson/Extender: Laura Santiago [facility] Weeks in Treatment: 68 Vital Signs Height(in): 66 Pulse(bpm): 61 Weight(lbs): 153 Blood Pressure(mmHg): 156/79 Body Mass Index(BMI): 24.7 Temperature(F): 98.1 Respiratory Rate(breaths/min): 18 [8:Photos:] [N/A:N/A] Left, Lateral Lower Leg N/A N/A Wound Location: Pressure Injury N/A N/A Wounding Event: Venous Leg Ulcer N/A N/A Primary Etiology: Hypertension, Osteoarthritis, ReceivedN/A N/A Comorbid History: Chemotherapy, Received Radiation 10/18/2022 N/A N/A Date Acquired: 16 N/A N/A Weeks of Treatment: Open N/A N/A Wound StatusJESLIN, KENTER ([id number]) [id number] Page 5 of 9 No N/A N/A Wound Recurrence: 0.7x0.9x0.1 N/A N/A Measurements L x W x D (cm) 0.495 N/A N/A A (cm) : rea 0.049 N/A N/A Volume (cm) :  88.10% N/A N/A % Reduction in Area: 94.10% N/A N/A % Reduction in Volume: Full Thickness Without Exposed N/A N/A Classification: Support Structures Medium N/A N/A Exudate Amount: Serosanguineous N/A N/A Exudate Type: red, brown N/A N/A Exudate Color: Treatment Notes Electronic Signature(s) Signed: 02/09/2023 1:35:31 PM By: Laura Santiago Entered By: Laura Santiago on 02/07/2023 10:16:41 -------------------------------------------------------------------------------- Multi-Disciplinary Care Plan Details Patient Name: Date of Service: Laura Jewett [name] 02/07/2023 10:00 [place] Record Number: [id number] Patient Account Number: [id number] Date of Birth/Sex: Treating RN: 1946/01/31 (77 y.o. Laura Santiago Primary Care Cortni Tays: [place] Other Clinician: Massie Santiago Referring Acheron Sugg: Treating Ignacia Gentzler/Extender: Laura Santiago [facility] Weeks in Treatment: 30 Active Inactive Necrotic Tissue Nursing Diagnoses: Impaired tissue integrity related to necrotic/devitalized tissue Knowledge deficit related to management of  necrotic/devitalized tissue Goals: Necrotic/devitalized tissue will be minimized in the wound bed Date Initiated: 07/06/2021 Target Resolution Date: 12/13/2022 Goal Status: Active Patient/caregiver will verbalize understanding of reason and process for debridement of necrotic tissue Date Initiated: 07/06/2021 Date Inactivated: 10/05/2021 Target Resolution Date: 07/06/2021 Goal Status: Met Interventions: Assess patient pain level pre-, during and Santiago procedure and prior to discharge Provide education on necrotic tissue and debridement process Treatment Activities: Apply topical anesthetic as ordered : 07/06/2021 Biologic debridement : 07/06/2021 Enzymatic debridement : 07/06/2021 Excisional debridement : 07/06/2021 Notes: Soft Tissue Infection Nursing Diagnoses: Impaired tissue integrity Potential for infection: soft tissue RELLA, MISEK ([id number]) [id number].pdf Page 6 of 9 Goals: Patient's soft tissue infection will resolve Date Initiated: 03/15/2022 Target Resolution Date: 12/13/2022 Goal Status: Active Signs and symptoms of infection will be recognized early to allow for prompt treatment Date Initiated: 03/15/2022 Target Resolution Date: 12/13/2022 Goal Status: Active Interventions: Assess signs and symptoms of infection every visit Treatment Activities: Culture and sensitivity : 03/15/2022 Notes: Electronic Signature(s) Signed: 02/08/2023 6:12:13 PM By: Gretta Cool, BSN, RN, CWS, [name] RN, BSN Signed: 02/09/2023 1:35:31 PM By: Laura Santiago Entered By: Laura Santiago on 02/07/2023 10:54:32 -------------------------------------------------------------------------------- Pain Assessment Details Patient Name: Date of Service: Laura Jewett [name] 02/07/2023 10:00 [place] Record Number: [id number] Patient Account Number: [id number] Date of Birth/Sex: Treating RN: October 05, 1946 (77 y.o. Laura Santiago Primary Care Naji Mehringer: [place] Other Clinician:  Massie Santiago Referring Oley Lahaie: Treating Aelyn Stanaland/Extender: Laura Santiago [facility] Weeks in Treatment: 36 Active Problems Location of Pain Severity and Description of Pain Patient Has Paino No Site Locations Pain Management and Medication Current Pain Management: Electronic Signature(s) Signed: 02/08/2023 6:12:13 PM By: Gretta Cool, BSN, RN, CWS, [name] RN, BSN Signed: 02/09/2023 1:35:31 PM By: Theodora Blow ([id number]) [id number] Page 7 of 9 Entered By: Laura Santiago on 02/07/2023 10:07:38 -------------------------------------------------------------------------------- Patient/Caregiver Education Details Patient Name: Date of Service: Laura Santiago 2/28/2024andnbsp10:00 [place] Record Number: [id number] Patient Account Number: [id number] Date of Birth/Gender: Treating RN: 03-17-1946 (77 y.o. Laura Santiago Primary Care Physician: [place] Other Clinician: Massie Santiago Referring Physician: Treating Physician/Extender: Laura Santiago [facility] Weeks in Treatment: 23 Education Assessment Education Provided To: Patient Education Topics Provided Wound/Skin Impairment: Handouts: Other: continue wound care as directed Methods: Explain/Verbal Responses: State content correctly Electronic Signature(s) Signed: 02/09/2023 1:35:31 PM By: Laura Santiago Entered By: Laura Santiago on 02/07/2023 12:19:37 -------------------------------------------------------------------------------- Wound Assessment Details Patient Name: Date of Service: Laura Jewett [name] 02/07/2023 10:00 A M Medical Record Number: [id number] Patient Account Number: [id number] Date of Birth/Sex: Treating RN: March 10, 1946 (77 y.o. F) Gretta Cool,  [name] Primary Care Gaurav Baldree: [place] Other Clinician: Massie Santiago Referring Linkin Vizzini: Treating Mccartney Chuba/Extender: [facility] Weeks in Treatment:  65 Wound Status Wound Number: 8 Primary Venous Leg Ulcer Etiology: Wound Location: Left, Lateral Lower Leg Wound Status: Open Wounding Event: Pressure Injury Comorbid Hypertension, Osteoarthritis, Received Chemotherapy, Date Acquired: 10/18/2022 History: Received Radiation Weeks Of Treatment: 16 Clustered Wound: No Photos Laura Santiago, Laura Santiago ([id number]) [id number] Page 8 of 9 Wound Measurements Length: (cm) 0.7 Width: (cm) 0.9 Depth: (cm) 0.1 Area: (cm) 0.495 Volume: (cm) 0.049 % Reduction in Area: 88.1% % Reduction in Volume: 94.1% Wound Description Classification: Full Thickness Without Exposed Support Exudate Amount: Medium Exudate Type: Serosanguineous Exudate Color: red, brown Structures Treatment Notes Wound #8 (Lower Leg) Wound Laterality: Left, Lateral Cleanser Wound Cleanser Discharge Instruction: Wash your hands with soap and water. Remove old dressing, discard into plastic bag and place into trash. Cleanse the wound with Wound Cleanser prior to applying a clean dressing using gauze sponges, not tissues or cotton balls. Do not scrub or use excessive force. Pat dry using gauze sponges, not tissue or cotton balls. Peri-Wound Care Topical Triamcinolone Acetonide Cream, 0.1%, 15 (g) tube Discharge Instruction: Mix 1:1 with Nystatin cream and apply to periwound and leg Primary Dressing PolyMem MAX Silver Non-Adhesive Dressing, 8x8 (in/in) Secondary Dressing ABD Pad 5x9 (in/in) Discharge Instruction: Cover with ABD pad Secured With Compression Wrap 3-LAYER WRAP - Profore Lite LF 3 Multilayer Compression Bandaging System Discharge Instruction: Apply 3 multi-layer wrap as prescribed. Compression Stockings Environmental education officer) Signed: 02/08/2023 6:12:13 PM By: Gretta Cool, BSN, RN, CWS, [name] RN, BSN Signed: 02/09/2023 1:35:31 PM By: Laura Santiago Entered By: Laura Santiago on 02/07/2023 10:15:37 [name] ([id number])  [phone].pdf Page 9 of 9 -------------------------------------------------------------------------------- Vitals Details Patient Name: Date of Service: Laura Santiago. 02/07/2023 10:00 [place] Record Number: [id number] Patient Account Number: [id number] Date of Birth/Sex: Treating RN: 12-May-1946 (77 y.o. Laura Santiago Primary Care Adyen Bifulco: [place] Other Clinician: Massie Santiago Referring Cassondra Stachowski: Treating Laurin Paulo/Extender: Laura Santiago [facility] Weeks in Treatment: 23 Vital Signs Time Taken: 10:04 Temperature (F): 98.1 Height (in): 66 Pulse (bpm): 62 Weight (lbs): 153 Respiratory Rate (breaths/min): 18 Body Mass Index (BMI): 24.7 Blood Pressure (mmHg): 156/79 Reference Range: 80 - 120 mg / dl Electronic Signature(s) Signed: 02/09/2023 1:35:31 PM By: Laura Santiago Entered By: Laura Santiago on 02/07/2023 10:07:27

## 2023-02-13 ENCOUNTER — Inpatient Hospital Stay: Payer: Medicare Other | Admitting: Oncology

## 2023-02-13 ENCOUNTER — Encounter: Payer: Self-pay | Admitting: Oncology

## 2023-02-13 ENCOUNTER — Other Ambulatory Visit: Payer: Medicare Other

## 2023-02-13 MED ORDER — AMPHETAMINE-DEXTROAMPHET ER 30 MG PO CP24: 30.0000 mg | ORAL_CAPSULE | Freq: Every day | ORAL | 0 refills | Status: DC

## 2023-02-13 NOTE — Telephone Encounter (Signed)
Patient will need to see me during the week of 03/05/2023.  Unclear as to why she did not show up today

## 2023-02-14 ENCOUNTER — Encounter: Payer: Medicare Other | Attending: Internal Medicine | Admitting: Internal Medicine

## 2023-02-14 DIAGNOSIS — C801 Malignant (primary) neoplasm, unspecified: Secondary | ICD-10-CM | POA: Insufficient documentation

## 2023-02-14 DIAGNOSIS — I129 Hypertensive chronic kidney disease with stage 1 through stage 4 chronic kidney disease, or unspecified chronic kidney disease: Secondary | ICD-10-CM | POA: Insufficient documentation

## 2023-02-14 DIAGNOSIS — L97822 Non-pressure chronic ulcer of other part of left lower leg with fat layer exposed: Secondary | ICD-10-CM | POA: Diagnosis present

## 2023-02-14 DIAGNOSIS — I87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: Secondary | ICD-10-CM

## 2023-02-14 DIAGNOSIS — Z5982 Transportation insecurity: Secondary | ICD-10-CM | POA: Diagnosis not present

## 2023-02-14 DIAGNOSIS — C79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: Secondary | ICD-10-CM | POA: Insufficient documentation

## 2023-02-14 DIAGNOSIS — I87313 Chronic venous hypertension (idiopathic) with ulcer of bilateral lower extremity: Secondary | ICD-10-CM | POA: Diagnosis not present

## 2023-02-14 DIAGNOSIS — Z86718 Personal history of other venous thrombosis and embolism: Secondary | ICD-10-CM | POA: Diagnosis not present

## 2023-02-14 DIAGNOSIS — N184 Chronic kidney disease, stage 4 (severe): Secondary | ICD-10-CM | POA: Diagnosis not present

## 2023-02-14 DIAGNOSIS — Z7901 Long term (current) use of anticoagulants: Secondary | ICD-10-CM | POA: Diagnosis not present

## 2023-02-15 ENCOUNTER — Other Ambulatory Visit: Payer: Self-pay | Admitting: *Deleted

## 2023-02-15 ENCOUNTER — Other Ambulatory Visit: Payer: Self-pay | Admitting: Oncology

## 2023-02-15 DIAGNOSIS — Z5181 Encounter for therapeutic drug level monitoring: Secondary | ICD-10-CM

## 2023-02-15 DIAGNOSIS — R9430 Abnormal result of cardiovascular function study, unspecified: Secondary | ICD-10-CM

## 2023-02-15 DIAGNOSIS — C50919 Malignant neoplasm of unspecified site of unspecified female breast: Secondary | ICD-10-CM

## 2023-02-16 MED ORDER — OXYCODONE HCL 10 MG PO TABS: 15.0000 mg | ORAL_TABLET | Freq: Four times a day (QID) | ORAL | 0 refills | Status: DC | PRN

## 2023-02-16 NOTE — Progress Notes (Signed)
Laura, Santiago ([id number]) [id number] Page 1 of 13 Visit Report for 02/14/2023 Chief Complaint Document Details Patient Name: Date of Service: Laura Santiago. 02/14/2023 10:00 [place] Record Number: [id number] Patient Account Number: [id number] Date of Birth/Sex: Treating RN: 01-13-1946 (77 y.o. [name] Primary Care Provider: [place] Other Clinician: [name] Referring Provider: Treating Provider/Extender: [name] Weeks in Treatment: 34 Information Obtained from: Patient Chief Complaint Left lower extremity wound Right toe wounds Left upper lateral thigh wounds Electronic Signature(s) Signed: 02/14/2023 1:18:38 PM By: Kalman Shan DO Entered By: Kalman Shan on 02/14/2023 11:20:26 -------------------------------------------------------------------------------- Debridement Details Patient Name: Date of Service: Laura Santiago. 02/14/2023 10:00 [place] Record Number: [id number] Patient Account Number: [id number] Date of Birth/Sex: Treating RN: 1946-04-18 (77 y.o. [name] Primary Care Provider: [place] Other Clinician: [name] Referring Provider: Treating Provider/Extender: [name] Weeks in Treatment: 6 Debridement Performed for Assessment: Wound #8 Left,Lateral Lower Leg Performed By: Physician Kalman Shan, MD Debridement Type: Debridement Severity of Tissue Pre Debridement: Fat layer exposed Level of Consciousness (Pre-procedure): Awake and Alert Pre-procedure Verification/Time Out Yes - 11:04 Taken: Start Time: 11:04 T Area Debrided (L x W): otal 0.5 (cm) x 0.5 (cm) = 0.25 (cm) Tissue and other material debrided: Skin: Dermis , Skin: Epidermis Level: Skin/Epidermis Debridement Description: Selective/Open Wound Instrument: Curette Bleeding: None Response to Treatment: Procedure was tolerated  well Level of Consciousness (Post- Awake and Alert procedure): KEISHAUNA, [name] ([id number]) [id number] Page 2 of 13 Post Debridement Measurements of Total Wound Length: (cm) 0.1 Width: (cm) 0.1 Depth: (cm) 0.1 Volume: (cm) 0.001 Character of Wound/Ulcer Post Debridement: Stable Severity of Tissue Post Debridement: Fat layer exposed Post Procedure Diagnosis Same as Pre-procedure Electronic Signature(s) Signed: 02/14/2023 1:18:38 PM By: Kalman Shan DO Signed: 02/14/2023 4:49:21 PM By: [name] Signed: 02/14/2023 9:49:41 PM By: Gretta Cool, BSN, RN, CWS, [name] RN, BSN Entered By: [name] on 02/14/2023 11:07:18 -------------------------------------------------------------------------------- HPI Details Patient Name: Date of Service: Laura Santiago. 02/14/2023 10:00 [place] Record Number: [id number] Patient Account Number: [id number] Date of Birth/Sex: Treating RN: 01-11-1946 (77 y.o. [name] Primary Care Provider: [place] Other Clinician: [name] Referring Provider: Treating Provider/Extender: Kalman Shan [facility] Weeks in Treatment: 59 History of Present Illness HPI Description: Admission 7/27 Ms. Laura Santiago is a 77 year old female with a past medical history of ADHD, metastatic breast cancer, stage IV chronic kidney disease, history of DVT on Xarelto and chronic venous insufficiency that presents to the clinic for a chronic left lower extremity wound. She recently moved to [facility] 4 days ago. She was being followed by wound care center in [name]. She reports a 10-year history of wounds to her left lower extremity that eventually do heal with debridement and compression therapy. She states that the current wound reopened 4 months ago and she is using Vaseline and Coban. She denies signs of infection. 8/3; patient presents for 1 week follow-up. She reports no issues or complaints  today. She states she had vascular studies done in the last week. She denies signs of infection. She brought her little service dog with her today. 8/17; patient presents for follow-up. She has missed her last clinic appointment. She states she took the wrap off and attempted to rewrap her leg. She is having difficulty with transportation. She has her service dog with  her today. Overall she feels well and reports improvement in wound healing. She denies signs of infection. She reports owning an old Velcro wrap compression and has this at her living facility 9/14; patient presents for follow-up. Patient states that over the past 2 to 3 weeks she developed toe wounds to her right foot. She attributes this to tight fitting shoes. She subsequently developed cellulitis in the right leg and has been treated by doxycycline by her oncologist. She reports improvement in symptoms however continues to have some redness and swelling to this leg. T the left lower extremity patient has been having her wraps changed with home health twice weekly. She states that the [facility] is not helping control o the drainage. Other than that she has no issues or complaints today. She denies signs of infection to the left lower extremity. 9/21; patient presents for follow-up. She reports seeing infectious disease for her cellulitis. She reports no further management. She has home health that changes the wraps twice weekly. She has no issues or complaints today. She denies signs of infection. 10/5; patient presents for follow-up. She has no issues or complaints today. She denies signs of infection. She states that the right great toe has not been dressed by home health. 10/12; patient presents for follow-up. She has no issues or complaints today. She reports improvement in her wound healing. She has been using silver alginate to the right great toe wound. She denies signs of infection. 10/26; patient presents for  follow-up. Home health did not have sorbact so they continued to use Hydrofera Blue under the wrap. She has been using silver alginate to the great toe wound however she did not have a dressing in place today. She currently denies signs of infection. 11/2; patient presents for follow-up. She has been using sorb act under the compression wrap. She reports using silver alginate to the toe wound again she does not have a dressing in place. She currently denies signs of infection. 11/23; patient presents for follow-up. Unfortunately she has missed her last 2 clinic appointments. She was last seen 3 weeks ago. She did her own compression wrap with Kerlix and Coban yesterday after seeing vein and vascular. She has not been dressing her right great toe wound. She currently denies TIPHANIE, CONNERY ([id number]) [id number] Page 3 of 13 signs of infection. 11/30; patient presents for 1 week follow-up. She states she changed her dressing last week prior to home health and use sorb act with Dakin's and Hydrofera Blue. Home health has changed the dressing as well and they have been using sorbact. T oday she reports increased redness to her right lower extremity. She has a history of cellulitis to this leg. She has been using silver alginate to the right great toe. Unfortunately she had an episode of diarrhea prior to coming in and had feces all over the right leg and to the wrap of her left leg. 12/7; patient presents for 1 week follow-up. She states that home health did not come out to change the dressing and she took it off yesterday. It is unclear if she is dressing the right toe wound. She denies signs of infection. 12/14; patient presents for 1 week follow-up. She has no issues or complaints today. 12/21; patient presents for follow-up. She has no issues or complaints today. She denies signs of infection. 12/28/2021; patient presents for follow-up. She was hospitalized for sepsis  secondary to right lower extremity cellulitis On 12/23. She states she is currently at  a SNF. She states that she was started on doxycycline this morning for her right great toe swelling and redness. She is not sure what dressings have been done to her left lower extremity for the past 3 weeks. She says its been mainly gauze with an Santiago wrap. 1/25; patient presents for follow-up. She is still residing in a skilled nursing facility. She reports mild pain to the left lower extremity wound bed. She states she is going to see a podiatrist soon. 2/8; patient presents for follow-up. She has moved back to her residential community from her skilled nursing facility. She has no issues or complaints today. She denies signs of systemic infections. 2/15; patient presents for follow-up. He has no issues or complaints today. She denies systemic signs of infection. 2/22; patient presents for follow-up. She has no issues or complaints today. She denies signs of infection. 3/1; patient presents for follow-up. She states that home health came out the day after she was seen in our clinic and yesterday to do the wrap change. She denies signs of infection. She reports excoriated skin on the ankle. 3/8; patient presents for follow-up. She has no issues or complaints today. She denies signs of infection. 3/15; patient presents for follow-up. Home health has been coming out to change the dressings. She reports more tenderness to the wound site. She denies purulent drainage, increased warmth or erythema to the area. 4/5; patient presents for follow-up. She has missed her last 2 clinic appointments. I have not seen her in 3 weeks. She was recently hospitalized for altered mental status. She was involuntarily committed. She was evaluated by psychiatry and deemed to have competency. There was no specific cause of her altered mental status. It was concluded that her physical and mental health were declining due to her chronic  medical conditions. Currently home health has been coming out for dressing changes. Patient has also been doing her own dressing changes. She reports more skin breakdown to the periwound and now has a new wound. She denies fever/chills. She reports continued tenderness to the wound site. 4/12; patient with significant venous insufficiency and a large wound on her left lower leg taking up about 80% of the circumference of her lower leg. Cultures of this grew MRSA and Pseudomonas. She had completed a course of ciprofloxacin now is starting doxycycline. She has been using Dakin's wet-to-dry and a Tubigrip. She has home health twice a week and we change it once. 4/19; patient presents for follow-up. She completed her course of doxycycline. She has been using Dakin's wet-to-dry dressing and Tubigrip. Home health changes the dressing twice weekly. Currently she has no issues or complaints. 4/26; patient presents for follow-up. At last clinic visit orders for home health were Iodosorb under compression therapy. Unfortunately they did not have the dressing and have been using Dakin's and gentamicin under the wrap. Patient currently denies signs of infection. She has no issues or complaints today. 5/3; patient presents for follow-up. Again Iodosorb has not been used under the compression therapy when home health comes out to change the wrap and dressing. They have been using Sorbact. It is unclear why this is happening since we send orders weekly to the agency. She denies signs of infection. Patient has not purchased the [place] antibiotics. We reached out to the company and they said they have been trying to contact her on a regular basis. We gave the patient the number to call to order the medication. 5/10; patient presents for follow-up. She has  no issues or complaints today. Again home health has not been using Iodosorb. Mepilex was on the wound bed. No other dressings noted. She brought in her Keystone  antibiotics. She denies signs of infection. 5/17; patient presents for follow-up. Home health has come out twice since she was last seen. Joint well she has been using Keystone antibiotic with Sorbact under the compression wrap. She has no issues or complaints today. She denies signs of infection. 5/24; patient presents for follow-up. We have been using Keystone antibiotics with Sorbact under compression therapy. She is tolerating the treatment well. She is reporting improvement in wound healing. She denies signs of infection. 5/31; patient presents for follow-up. We continue to do [facility] antibiotics with Sorbact under compression therapy. She continues to report improvement in wound healing. Home health comes out and changes the dressing once weekly. 05-17-2022 upon evaluation today patient appears to be doing better in regard to her wound especially compared to the last time I saw her. Fortunately I do think that she is seeing improvements. With that being said I do believe that she may be benefit from sharp debridement today to clear away some of the necrotic debris I discussed that with her as well. She is an amendable to that plan. Otherwise she is very pleased with how the [employer] is doing for her. 6/14; patient presents for follow-up. We have been using Keystone antibiotic with Sorbact and absorbent dressings under 3 layer compression. She has no issues or complaints today. She reports improvement in wound healing. She denies signs of infection. 6/21; patient presents for follow-up. We are continuing with [facility] antibiotic and Sorbact under 3 layer compression. Patient has no complaints. Continued wound healing is happening. She denies signs of infection. 6/28; patient presents for follow-up. We have been using Keystone antibiotic with Sorbact under 3 layer compression. Usually home health comes out and changes the dressing twice a week. Unfortunately they did not go out to change the  dressing. It is unclear why. Patient did not call them. She currently denies signs of infection. 7/5; patient presents for follow-up. We have been using Keystone antibiotic with calcium alginate under 3 layer compression. She reports improvement in wound healing. She denies signs of infection. Home health has come out to do dressing changes twice this past week. 7/12; patient presents for follow-up. We have been using Keystone antibiotic with calcium alginate under 3 layer compression. Patient states that home health came out once last week to change the dressing. She reports improvement in wound healing. She currently denies signs of infection. 7/19; patient presents for follow-up. We have been using Keystone antibiotic with calcium alginate under 3 layer compression. Home health came out once last week to change the dressing. She has no issues or complaints today. She denies signs of infection. ARRYANNA, WATTON ([id number]) [id number] Page 4 of 13 8/2; patient presents for follow-up. We have been using Keystone antibiotic with calcium alginate under 3 layer compression. Unfortunately she missed her appointment last week and home health did not come out to do dressing changes. Patient currently denies signs of infection. 8/9; patient presents for follow-up. We have been using Keystone with calcium alginate under 3 layer compression. She states that home health came out once last week. She currently denies signs of infection. Her wrap was completely wet. She states she was cleaning the top of the leg and water soaked down into the wrap. 8/16; patient presents for follow-up. We have been using Keystone with calcium alginate under  3 layer compression. She states that home health came out twice last week. She has no issues or complaints today. 8/23; patient presents for follow-up. He has been using Keystone with calcium alginate under 3 layer compression. Home health came  out twice last week. She denies signs of infection. 8/30; patient presents for follow-up. We have been using Keystone with calcium alginate under 3 layer compression. Home health came out once last week to change the dressing. Patient reports improvement in wound healing. She states she is almost done with her chemotherapy infusions and has 1 more left. 9/13; patient presents for follow-up. She has lost the capsules to her [facility] antibiotic which I believe is the vancomycin pills. She has her Zosyn powder today. We have been using Keystone antibiotic ointment with calcium alginate under 3 layer compression. She is concerned about systemic infection however her vitals are stable and there is no surrounding soft tissue infection. She would like to remain a patient in our wound care center however would like a second opinion for her wound care at another facility. She asked to be referred to [facility] wound care center. 9/20; patient presents for follow-up. She found her vancomycin capsules and brought in her complete Keystone antibiotic ointment set today. Unfortunately she has developed skin breakdown and Erythema to the right lower extremity With increased swelling. She states she went to a pow wow Over the weekend and was on her feet for extended periods of time. She saw her oncologist yesterday who prescribed her doxycycline for her right lower extremity erythema. 9/27; patient presents for follow-up. We have been using Keystone antibiotic with Aquacel under 3 layer compression to the lower extremities bilaterally. When home health came and changed the wrap she secretly put coffee into the spray mix along with [facility] antibiotic on her leg thinking the acidic component would better activate the zoysn (sonething she discussed with her microbiologist brother). She has reported improvement in wound healing. 10/4; patient presents for follow-up. She has no issues or complaints today. We have been doing  Aquacel and keystone under 3 layer compression to the lower extremities bilaterally. This morning she took the right lower extremity wrap off as it was uncomfortable. She has no open wounds to this leg. 10/11; patient presents for follow-up. We have been doing Aquacel with Keystone antibiotic ointment under 3 layer compression to the left lower extremity. She developed a small blister to the anterior aspect of the left leg noticed when the wrap was taken off on intake. She currently denies signs of infection. 10/18; patient presents for follow-up. We have been doing Aquacel with Keystone antibiotic ointment under 3 layer compression to the left lower extremity. There has been continued improvement in wound healing. She denies signs of infection. 10/25; patient arrives for treatment of venous insufficiency ulcers on her left lower leg both lateral and medial are remanence of apparently a circumferential wound. Much improved. We are using topicals Keystone and Aquacel Ag under 3 layer compression we continue to make good progress. The patient talk to me at some length with regards to different things she has on her forehead and her Peri orbital area for which she is apparently applying [place]. She feels that what ever we are treating on her wounds is a more systemic problem. I really was not able to get a handle on what she is talking about however I did caution her not to put the [place] in her eyes. 11/1; her wounds continue to improve she is using  Keystone and Aquacel Ag G under 3 layer compression. Our intake nurse notes erythema and edema in the right leg. The patient has a litany of concerns with regards to a rash on her forehead or ears and other systemic complaints. She has an appointment with dermatology on November 11 11/8; patient presents for follow-up. We have been using Keystone and Aquacel under 4-layer compression. She has no issues or complaints today. She reports improvement in  wound healing. 11/15; patient presents for follow-up. We have been using Aquacel with Keystone antibiotic under 3 layer compression. Patient continues improvement in wound healing. 12/6; patient presents for follow-up. We have been using Aquacel with Keystone antibiotic ointment under 3 layer compression. Wounds appear well-healing. 12/13; patient presents for follow-up. We have been using Aquacel with Keystone antibiotic under 3 layer compression. She has no issues or complaints today. 12/27 left lateral medial ankle. Superficial wounds remain there is significantly improved we are using Keystone backed with Zetuvit under 4-layer compression 1/3; patient presents for follow-up. Her wounds appear well-healing. We have been using Aquacel Ag with Keystone antibiotic ointment under compression therapy. This should be a 3 layer compression. 1/17; patient presents for follow-up. Her wounds on her left lower extremity are well-healing. We are using Aquacel Ag and Keystone antibiotic ointment under compression therapy. She missed her last clinic appointment. She states that the wrap has been changed twice weekly since she was last seen. Unfortunately she has developed increased warmth and redness to the right lower extremity consistent with cellulitis. She states this started a few days ago. 1/24; patient presents for follow-up. Her wounds on the left lower extremity are well-healing with Aquacel and Keystone antibiotic ointment under compression therapy. I prescribed Keflex at last clinic visit for cellulitis of the right lower extremity. Unfortunately this has not resolved. She did not follow-up with her PCP for contact our office about her symptoms. 1/31; patient presents for follow-up. We have been using Aquacel and Keystone antibiotic ointment under compression therapy to the left lower extremity. Wounds appear well-healing. She has been taking clindamycin for the past week. Her symptoms have improved  slightly with a decrease in erythema and warmth to the right lower extremity. She says her pain level has improved. However Symptoms have not completely resolved. She denies fever/chills, nausea/vomiting. 2/7; patient presents for follow-up. We have been using Aquacel Ag with Keystone antibiotic ointment under compression therapy to the left lower extremity. For the past week home health has not been using Keystone antibiotic ointment. She continues to take clindamycin. Her symptoms have improved greatly with the decrease in erythema and warmth to the right lower extremity. She denies systemic signs of infection. She has an area of skin breakdown to the right anterior leg. 2/14; Patient presents for follow-up. She has been using Hydrofera Blue to the right anterior leg under Tubigrip. It looks like Hydrofera Blue is also being used with Keystone to the left lower extremity under compression therapy. Order is for [place] [facility]. Overall wounds appear well healing. She has no issues or complaints. 2/21; patient presents for follow-up. We have been using Hydrofera Blue under 3 layer compression to the lower extremities bilaterally. Her right lower extremity wounds have healed. She has a small open wound remaining to her left lateral leg. 2/28; patient presents for follow-up. We have been using Hydrofera Blue under 3 layer compression. Home health has changed the dressing to the left lower extremity however started the wrap at the ankle. TIMIRA, LEIDEL ([id number]) [id number]  Page 5 of 13 3/6; patient presents for follow-up. We have been using PolyMem with antibiotic ointment under 3 layer compression to the left lower extremity. She has no issues or complaints today. Electronic Signature(s) Signed: 02/14/2023 1:18:38 PM By: Kalman Shan DO Entered By: Kalman Shan on 02/14/2023  11:21:23 -------------------------------------------------------------------------------- Physical Exam Details Patient Name: Date of Service: Laura Santiago. 02/14/2023 10:00 A Santiago Medical Record Number: [id number] Patient Account Number: [id number] Date of Birth/Sex: Treating RN: 08-Jan-1946 (77 y.o. [name] Primary Care Provider: [place] Other Clinician: [name] Referring Provider: Treating Provider/Extender: Kalman Shan [facility] Weeks in Treatment: 51 Constitutional . Cardiovascular . Psychiatric . Notes Small open area to the lateral left leg With granulation tissue and devitalized tissue. Electronic Signature(s) Signed: 02/14/2023 1:18:38 PM By: Kalman Shan DO Entered By: Kalman Shan on 02/14/2023 11:21:53 -------------------------------------------------------------------------------- Physician Orders Details Patient Name: Date of Service: Laura Santiago. 02/14/2023 10:00 A Santiago Medical Record Number: [id number] Patient Account Number: [id number] Date of Birth/Sex: Treating RN: 1946/08/06 (77 y.o. [name] Primary Care Provider: [place] Other Clinician: [name] Referring Provider: Treating Provider/Extender: [name] Weeks in Treatment: 53 Verbal / Phone Orders: No Diagnosis Coding Follow-up Appointments Return Appointment in 1 week. DANIYA, HERMANCE ([id number]) [id number] Page 6 of 13 Nurse Visit as needed [place]: Rocky Morel [phone] [place] for wound care. May utilize formulary equivalent dressing for wound treatment orders unless otherwise specified. Home Health Nurse may visit PRN to address patients wound care needs. - home health to see patient 2 times per week, patient to be seen at wound clinic once per week **Please direct any NON-WOUND related issues/requests for orders to  patient's Primary Care Physician. **If current dressing causes regression in wound condition, may D/C ordered dressing product/s and apply Normal Saline Moist Dressing daily until next [place] or Other MD appointment. **Notify [facility] of regression in wound condition at [phone]. Bathing/ Shower/ Hygiene May shower with wound dressing protected with water repellent cover or cast protector. No tub bath. Anesthetic (Use 'Patient Medications' Section for Anesthetic Order Entry) Lidocaine applied to wound bed Edema Control - Lymphedema / Segmental Compressive Device / Other Optional: One layer of unna paste to top of compression wrap (to act as an anchor). Patient to wear own Velcro compression garment. Remove compression stockings every night before going to bed and put on every morning when getting up. - juxtalite wrap for right lower leg Elevate, Exercise Daily and A void Standing for Long Periods of Time. Elevate legs to the level of the heart and pump ankles as often as possible Elevate leg(s) parallel to the floor when sitting. DO YOUR BEST to sleep in the bed at night. DO NOT sleep in your recliner. Long hours of sitting in a recliner leads to swelling of the legs and/or potential wounds on your backside. Additional Orders / Instructions Follow Nutritious Diet and Increase Protein Intake Medications-Please add to medication list. ntibiotic - gentamycin and Mupirocin to wound Topical A Wound Treatment Wound #8 - Lower Leg Wound Laterality: Left, Lateral Cleanser: Wound Cleanser 3 x Per Week/30 Days Discharge Instructions: Wash your hands with soap and water. Remove old dressing, discard into plastic bag and place into trash. Cleanse the wound with Wound Cleanser prior to applying a clean dressing using gauze sponges, not tissues or cotton balls. Do not scrub or  use excessive force. Pat dry using gauze sponges, not tissue or cotton balls. Topical:  Triamcinolone Acetonide Cream, 0.1%, 15 (g) tube 3 x Per Week/30 Days Discharge Instructions: Mix 1:1 with Nystatin cream and apply to periwound and leg Prim Dressing: AquacelAg Advantage Dressing, 2X2 (in/in) 3 x Per Week/30 Days ary Discharge Instructions: Apply to wound as directed Secondary Dressing: ABD Pad 5x9 (in/in) 3 x Per Week/30 Days Discharge Instructions: Cover with ABD pad Compression Wrap: 3-LAYER WRAP - Profore Lite LF 3 Multilayer Compression Bandaging System 3 x Per Week/30 Days Discharge Instructions: Apply 3 multi-layer wrap as prescribed. Electronic Signature(s) Signed: 02/14/2023 1:18:38 PM By: Kalman Shan DO Entered By: Kalman Shan on 02/14/2023 11:24:30 -------------------------------------------------------------------------------- Problem List Details Patient Name: Date of Service: Laura Santiago. 02/14/2023 10:00 A Santiago Medical Record Number: [id number] Patient Account Number: [id number] Date of Birth/Sex: Treating RN: [birth year] (77 y.o. [name] Primary Care Provider: [place] Other Clinician: [name] Referring Provider: Treating Provider/Extender: Kalman Shan [facility] [place], [place] ([id number]) [id number] Page 7 of 13 Weeks in Treatment: 84 Active Problems ICD-10 Encounter Code Description Active Date MDM Diagnosis 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sed11/23/2022 No Yes 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 11/02/2021 No Yes I87.311 Chronic venous hypertension (idiopathic) with ulcer of right lower extremity 08/30/2022 No Yes I87.2 Venous insufficiency (chronic) (peripheral) 07/06/2021 No Yes Z79.01 Long term (current) use of anticoagulants 07/06/2021 No Yes I10 Essential (primary) hypertension 07/06/2021 No Yes 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 07/06/2021 No  Yes L03.115 Cellulitis of right lower limb 12/27/2022 No Yes S81.801A Unspecified open wound, right lower leg, initial encounter 01/17/2023 No Yes Inactive Problems ICD-10 Code Description Active Date Inactive Date S81.802A Unspecified open wound, left lower leg, initial encounter 07/06/2021 07/06/2021 S91.101A Unspecified open wound of right great toe without damage to nail, initial encounter 08/24/2021 08/24/2021 S91.104A Unspecified open wound of right lesser toe(s) without damage to nail, initial encounter 08/24/2021 08/24/2021 Resolved Problems ICD-10 Code Description Active Date Resolved Date S91.104D Unspecified open wound of right lesser toe(s) without damage to nail, subsequent 08/31/2021 08/31/2021 encounter S91.201D Unspecified open wound of right great toe with damage to nail, subsequent encounter 08/31/2021 08/31/2021 Electronic Signature(s) Signed: 02/14/2023 1:18:38 PM By: Kalman Shan DO Entered By: Kalman Shan on 02/14/2023 11:20:23 [name] ([id number]) [id number] Page 8 of 13 -------------------------------------------------------------------------------- Progress Note Details Patient Name: Date of Service: Laura Santiago. 02/14/2023 10:00 [place] Record Number: [id number] Patient Account Number: [id number] Date of Birth/Sex: Treating RN: [birth year] (77 y.o. [name] Primary Care Provider: [place] Other Clinician: [name] Referring Provider: Treating Provider/Extender: Kalman Shan [facility] Weeks in Treatment: 79 Subjective Chief Complaint Information obtained from Patient Left lower extremity wound Right toe wounds Left upper lateral thigh wounds History of Present Illness (HPI) Admission 7/27 Ms. Juanika Zahm is a 77 year old female with a past medical history of ADHD, metastatic breast cancer, stage IV chronic kidney disease, history of DVT on Xarelto and chronic venous  insufficiency that presents to the clinic for a chronic left lower extremity wound. She recently moved to [facility] 4 days ago. She was being followed by wound care center in [name]. She reports a 10-year history of wounds to her left lower extremity that eventually do heal with debridement and compression therapy. She states that the current wound  reopened 4 months ago and she is using Vaseline and Coban. She denies signs of infection. 8/3; patient presents for 1 week follow-up. She reports no issues or complaints today. She states she had vascular studies done in the last week. She denies signs of infection. She brought her little service dog with her today. 8/17; patient presents for follow-up. She has missed her last clinic appointment. She states she took the wrap off and attempted to rewrap her leg. She is having difficulty with transportation. She has her service dog with her today. Overall she feels well and reports improvement in wound healing. She denies signs of infection. She reports owning an old Velcro wrap compression and has this at her living facility 9/14; patient presents for follow-up. Patient states that over the past 2 to 3 weeks she developed toe wounds to her right foot. She attributes this to tight fitting shoes. She subsequently developed cellulitis in the right leg and has been treated by doxycycline by her oncologist. She reports improvement in symptoms however continues to have some redness and swelling to this leg. T the left lower extremity patient has been having her wraps changed with home health twice weekly. She states that the [facility] is not helping control o the drainage. Other than that she has no issues or complaints today. She denies signs of infection to the left lower extremity. 9/21; patient presents for follow-up. She reports seeing infectious disease for her cellulitis. She reports no further management. She has home health  that changes the wraps twice weekly. She has no issues or complaints today. She denies signs of infection. 10/5; patient presents for follow-up. She has no issues or complaints today. She denies signs of infection. She states that the right great toe has not been dressed by home health. 10/12; patient presents for follow-up. She has no issues or complaints today. She reports improvement in her wound healing. She has been using silver alginate to the right great toe wound. She denies signs of infection. 10/26; patient presents for follow-up. Home health did not have sorbact so they continued to use Hydrofera Blue under the wrap. She has been using silver alginate to the great toe wound however she did not have a dressing in place today. She currently denies signs of infection. 11/2; patient presents for follow-up. She has been using sorb act under the compression wrap. She reports using silver alginate to the toe wound again she does not have a dressing in place. She currently denies signs of infection. 11/23; patient presents for follow-up. Unfortunately she has missed her last 2 clinic appointments. She was last seen 3 weeks ago. She did her own compression wrap with Kerlix and Coban yesterday after seeing vein and vascular. She has not been dressing her right great toe wound. She currently denies signs of infection. 11/30; patient presents for 1 week follow-up. She states she changed her dressing last week prior to home health and use sorb act with Dakin's and Hydrofera Blue. Home health has changed the dressing as well and they have been using sorbact. T oday she reports increased redness to her right lower extremity. She has a history of cellulitis to this leg. She has been using silver alginate to the right great toe. Unfortunately she had an episode of diarrhea prior to coming in and had feces all over the right leg and to the wrap of her left leg. 12/7; patient presents for 1 week follow-up.  She states that home health did not  come out to change the dressing and she took it off yesterday. It is unclear if she is dressing the right toe wound. She denies signs of infection. 12/14; patient presents for 1 week follow-up. She has no issues or complaints today. 12/21; patient presents for follow-up. She has no issues or complaints today. She denies signs of infection. 12/28/2021; patient presents for follow-up. She was hospitalized for sepsis secondary to right lower extremity cellulitis On 12/23. She states she is currently at a SNF. She states that she was started on doxycycline this morning for her right great toe swelling and redness. She is not sure what dressings have been done to her left lower extremity for the past 3 weeks. She says its been mainly gauze with an Santiago wrap. IRMA, BYERLY ([id number]) [id number] Page 9 of 13 1/25; patient presents for follow-up. She is still residing in a skilled nursing facility. She reports mild pain to the left lower extremity wound bed. She states she is going to see a podiatrist soon. 2/8; patient presents for follow-up. She has moved back to her residential community from her skilled nursing facility. She has no issues or complaints today. She denies signs of systemic infections. 2/15; patient presents for follow-up. He has no issues or complaints today. She denies systemic signs of infection. 2/22; patient presents for follow-up. She has no issues or complaints today. She denies signs of infection. 3/1; patient presents for follow-up. She states that home health came out the day after she was seen in our clinic and yesterday to do the wrap change. She denies signs of infection. She reports excoriated skin on the ankle. 3/8; patient presents for follow-up. She has no issues or complaints today. She denies signs of infection. 3/15; patient presents for follow-up. Home health has been coming out to change the  dressings. She reports more tenderness to the wound site. She denies purulent drainage, increased warmth or erythema to the area. 4/5; patient presents for follow-up. She has missed her last 2 clinic appointments. I have not seen her in 3 weeks. She was recently hospitalized for altered mental status. She was involuntarily committed. She was evaluated by psychiatry and deemed to have competency. There was no specific cause of her altered mental status. It was concluded that her physical and mental health were declining due to her chronic medical conditions. Currently home health has been coming out for dressing changes. Patient has also been doing her own dressing changes. She reports more skin breakdown to the periwound and now has a new wound. She denies fever/chills. She reports continued tenderness to the wound site. 4/12; patient with significant venous insufficiency and a large wound on her left lower leg taking up about 80% of the circumference of her lower leg. Cultures of this grew MRSA and Pseudomonas. She had completed a course of ciprofloxacin now is starting doxycycline. She has been using Dakin's wet-to-dry and a Tubigrip. She has home health twice a week and we change it once. 4/19; patient presents for follow-up. She completed her course of doxycycline. She has been using Dakin's wet-to-dry dressing and Tubigrip. Home health changes the dressing twice weekly. Currently she has no issues or complaints. 4/26; patient presents for follow-up. At last clinic visit orders for home health were Iodosorb under compression therapy. Unfortunately they did not have the dressing and have been using Dakin's and gentamicin under the wrap. Patient currently denies signs of infection. She has no issues or complaints today. 5/3; patient presents for follow-up.  Again Iodosorb has not been used under the compression therapy when home health comes out to change the wrap and dressing. They have been using  Sorbact. It is unclear why this is happening since we send orders weekly to the agency. She denies signs of infection. Patient has not purchased the [place] antibiotics. We reached out to the company and they said they have been trying to contact her on a regular basis. We gave the patient the number to call to order the medication. 5/10; patient presents for follow-up. She has no issues or complaints today. Again home health has not been using Iodosorb. Mepilex was on the wound bed. No other dressings noted. She brought in her Keystone antibiotics. She denies signs of infection. 5/17; patient presents for follow-up. Home health has come out twice since she was last seen. Joint well she has been using Keystone antibiotic with Sorbact under the compression wrap. She has no issues or complaints today. She denies signs of infection. 5/24; patient presents for follow-up. We have been using Keystone antibiotics with Sorbact under compression therapy. She is tolerating the treatment well. She is reporting improvement in wound healing. She denies signs of infection. 5/31; patient presents for follow-up. We continue to do [facility] antibiotics with Sorbact under compression therapy. She continues to report improvement in wound healing. Home health comes out and changes the dressing once weekly. 05-17-2022 upon evaluation today patient appears to be doing better in regard to her wound especially compared to the last time I saw her. Fortunately I do think that she is seeing improvements. With that being said I do believe that she may be benefit from sharp debridement today to clear away some of the necrotic debris I discussed that with her as well. She is an amendable to that plan. Otherwise she is very pleased with how the [employer] is doing for her. 6/14; patient presents for follow-up. We have been using Keystone antibiotic with Sorbact and absorbent dressings under 3 layer compression. She has no issues or  complaints today. She reports improvement in wound healing. She denies signs of infection. 6/21; patient presents for follow-up. We are continuing with [facility] antibiotic and Sorbact under 3 layer compression. Patient has no complaints. Continued wound healing is happening. She denies signs of infection. 6/28; patient presents for follow-up. We have been using Keystone antibiotic with Sorbact under 3 layer compression. Usually home health comes out and changes the dressing twice a week. Unfortunately they did not go out to change the dressing. It is unclear why. Patient did not call them. She currently denies signs of infection. 7/5; patient presents for follow-up. We have been using Keystone antibiotic with calcium alginate under 3 layer compression. She reports improvement in wound healing. She denies signs of infection. Home health has come out to do dressing changes twice this past week. 7/12; patient presents for follow-up. We have been using Keystone antibiotic with calcium alginate under 3 layer compression. Patient states that home health came out once last week to change the dressing. She reports improvement in wound healing. She currently denies signs of infection. 7/19; patient presents for follow-up. We have been using Keystone antibiotic with calcium alginate under 3 layer compression. Home health came out once last week to change the dressing. She has no issues or complaints today. She denies signs of infection. 8/2; patient presents for follow-up. We have been using Keystone antibiotic with calcium alginate under 3 layer compression. Unfortunately she missed her appointment last week and home health  did not come out to do dressing changes. Patient currently denies signs of infection. 8/9; patient presents for follow-up. We have been using Keystone with calcium alginate under 3 layer compression. She states that home health came out once last week. She currently denies signs of  infection. Her wrap was completely wet. She states she was cleaning the top of the leg and water soaked down into the wrap. 8/16; patient presents for follow-up. We have been using Keystone with calcium alginate under 3 layer compression. She states that home health came out twice last week. She has no issues or complaints today. 8/23; patient presents for follow-up. He has been using Keystone with calcium alginate under 3 layer compression. Home health came out twice last week. She denies signs of infection. 8/30; patient presents for follow-up. We have been using Keystone with calcium alginate under 3 layer compression. Home health came out once last week to change the dressing. Patient reports improvement in wound healing. She states she is almost done with her chemotherapy infusions and has 1 more left. 9/13; patient presents for follow-up. She has lost the capsules to her [facility] antibiotic which I believe is the vancomycin pills. She has her Zosyn powder today. We have been using Keystone antibiotic ointment with calcium alginate under 3 layer compression. She is concerned about systemic infection however EVONDA, VANDEVEN ([id number]) [id number] Page 10 of 13 her vitals are stable and there is no surrounding soft tissue infection. She would like to remain a patient in our wound care center however would like a second opinion for her wound care at another facility. She asked to be referred to [facility] wound care center. 9/20; patient presents for follow-up. She found her vancomycin capsules and brought in her complete Keystone antibiotic ointment set today. Unfortunately she has developed skin breakdown and Erythema to the right lower extremity With increased swelling. She states she went to a pow wow Over the weekend and was on her feet for extended periods of time. She saw her oncologist yesterday who prescribed her doxycycline for her right lower extremity  erythema. 9/27; patient presents for follow-up. We have been using Keystone antibiotic with Aquacel under 3 layer compression to the lower extremities bilaterally. When home health came and changed the wrap she secretly put coffee into the spray mix along with [facility] antibiotic on her leg thinking the acidic component would better activate the zoysn (sonething she discussed with her microbiologist brother). She has reported improvement in wound healing. 10/4; patient presents for follow-up. She has no issues or complaints today. We have been doing Aquacel and keystone under 3 layer compression to the lower extremities bilaterally. This morning she took the right lower extremity wrap off as it was uncomfortable. She has no open wounds to this leg. 10/11; patient presents for follow-up. We have been doing Aquacel with Keystone antibiotic ointment under 3 layer compression to the left lower extremity. She developed a small blister to the anterior aspect of the left leg noticed when the wrap was taken off on intake. She currently denies signs of infection. 10/18; patient presents for follow-up. We have been doing Aquacel with Keystone antibiotic ointment under 3 layer compression to the left lower extremity. There has been continued improvement in wound healing. She denies signs of infection. 10/25; patient arrives for treatment of venous insufficiency ulcers on her left lower leg both lateral and medial are remanence of apparently a circumferential wound. Much improved. We are using topicals Keystone and Aquacel Ag under  3 layer compression we continue to make good progress. The patient talk to me at some length with regards to different things she has on her forehead and her Peri orbital area for which she is apparently applying [place]. She feels that what ever we are treating on her wounds is a more systemic problem. I really was not able to get a handle on what she is talking about however I did  caution her not to put the [place] in her eyes. 11/1; her wounds continue to improve she is using Keystone and Aquacel Ag G under 3 layer compression. Our intake nurse notes erythema and edema in the right leg. The patient has a litany of concerns with regards to a rash on her forehead or ears and other systemic complaints. She has an appointment with dermatology on November 11 11/8; patient presents for follow-up. We have been using Keystone and Aquacel under 4-layer compression. She has no issues or complaints today. She reports improvement in wound healing. 11/15; patient presents for follow-up. We have been using Aquacel with Keystone antibiotic under 3 layer compression. Patient continues improvement in wound healing. 12/6; patient presents for follow-up. We have been using Aquacel with Keystone antibiotic ointment under 3 layer compression. Wounds appear well-healing. 12/13; patient presents for follow-up. We have been using Aquacel with Keystone antibiotic under 3 layer compression. She has no issues or complaints today. 12/27 left lateral medial ankle. Superficial wounds remain there is significantly improved we are using Keystone backed with Zetuvit under 4-layer compression 1/3; patient presents for follow-up. Her wounds appear well-healing. We have been using Aquacel Ag with Keystone antibiotic ointment under compression therapy. This should be a 3 layer compression. 1/17; patient presents for follow-up. Her wounds on her left lower extremity are well-healing. We are using Aquacel Ag and Keystone antibiotic ointment under compression therapy. She missed her last clinic appointment. She states that the wrap has been changed twice weekly since she was last seen. Unfortunately she has developed increased warmth and redness to the right lower extremity consistent with cellulitis. She states this started a few days ago. 1/24; patient presents for follow-up. Her wounds on the left lower  extremity are well-healing with Aquacel and Keystone antibiotic ointment under compression therapy. I prescribed Keflex at last clinic visit for cellulitis of the right lower extremity. Unfortunately this has not resolved. She did not follow-up with her PCP for contact our office about her symptoms. 1/31; patient presents for follow-up. We have been using Aquacel and Keystone antibiotic ointment under compression therapy to the left lower extremity. Wounds appear well-healing. She has been taking clindamycin for the past week. Her symptoms have improved slightly with a decrease in erythema and warmth to the right lower extremity. She says her pain level has improved. However Symptoms have not completely resolved. She denies fever/chills, nausea/vomiting. 2/7; patient presents for follow-up. We have been using Aquacel Ag with Keystone antibiotic ointment under compression therapy to the left lower extremity. For the past week home health has not been using Keystone antibiotic ointment. She continues to take clindamycin. Her symptoms have improved greatly with the decrease in erythema and warmth to the right lower extremity. She denies systemic signs of infection. She has an area of skin breakdown to the right anterior leg. 2/14; Patient presents for follow-up. She has been using Hydrofera Blue to the right anterior leg under Tubigrip. It looks like Hydrofera Blue is also being used with Keystone to the left lower extremity under compression therapy. Order  is for Aqualcell Ag. Overall wounds appear well healing. She has no issues or complaints. 2/21; patient presents for follow-up. We have been using Hydrofera Blue under 3 layer compression to the lower extremities bilaterally. Her right lower extremity wounds have healed. She has a small open wound remaining to her left lateral leg. 2/28; patient presents for follow-up. We have been using Hydrofera Blue under 3 layer compression. Home health has  changed the dressing to the left lower extremity however started the wrap at the ankle. 3/6; patient presents for follow-up. We have been using PolyMem with antibiotic ointment under 3 layer compression to the left lower extremity. She has no issues or complaints today. Objective Constitutional Vitals Time Taken: 10:49 AM, Height: 66 in, Weight: 153 lbs, BMI: 24.7, Temperature: 97.9 F, Pulse: 66 bpm, Respiratory Rate: 18 breaths/min, Blood Pressure: 151/76 mmHg. ALEXUIS, PERSICO ([id number]) [id number] Page 11 of 13 General Notes: Small open area to the lateral left leg With granulation tissue and devitalized tissue. Integumentary (Hair, Skin) Wound #8 status is Open. Original cause of wound was Pressure Injury. The date acquired was: 10/18/2022. The wound has been in treatment 17 weeks. The wound is located on the Left,Lateral Lower Leg. The wound measures 0.1cm length x 0.1cm width x 0.1cm depth; 0.008cm^2 area and 0.001cm^3 volume. There is a none present amount of drainage noted. 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 I debrided nonviable tissue. We do not have PolyMem today in the office. Will go ahead and use Aquacel Ag with antibiotic ointment under 3 layer compression to the left lower extremity. Follow-up in 1 week. Procedures Wound #8 Pre-procedure diagnosis of Wound #8 is a Venous Leg Ulcer located on the Left,Lateral Lower Leg .Severity of  Tissue Pre Debridement is: Fat layer exposed. There was a Selective/Open Wound Skin/Epidermis Debridement with a total area of 0.25 sq cm performed by Kalman Shan, MD. With the following instrument(s): Curette Material removed includes Skin: Dermis and Skin: Epidermis and. A time out was conducted at 11:04, prior to the start of the procedure. There was no bleeding. The procedure was tolerated well. Post Debridement Measurements: 0.1cm length x 0.1cm width x 0.1cm depth; 0.001cm^3 volume. Character of Wound/Ulcer Post Debridement is stable. Severity of Tissue Post Debridement is: Fat layer exposed. Post procedure Diagnosis Wound #8: Same as Pre-Procedure Pre-procedure diagnosis of Wound #8 is a Venous Leg Ulcer located on the Left,Lateral Lower Leg . There was a Three Layer Compression Therapy Procedure with a pre-treatment ABI of 1.5 by [name]. Post procedure Diagnosis Wound #8: Same as Pre-Procedure Plan Follow-up Appointments: Return Appointment in 1 week. Nurse Visit as needed Home Health: [place]: Rocky Morel [phone] [place] for wound care. May utilize formulary equivalent dressing for wound treatment orders unless otherwise specified. Home Health Nurse may visit PRN to address patientoos wound care needs. - home health to see patient 2 times per week, patient to be seen at wound clinic once per week **Please direct any NON-WOUND related issues/requests for orders to patient's Primary Care Physician. **If current dressing causes regression in wound condition, may D/C  ordered dressing product/s and apply Normal Saline Moist Dressing daily until next [place] or Other MD appointment. **Notify [facility]aling Center of regression in wound condition at [phone]. Bathing/ Shower/ Hygiene: May shower with wound dressing protected with water repellent cover or cast protector. No tub bath. Anesthetic (Use 'Patient Medications' Section for  Anesthetic Order Entry): Lidocaine applied to wound bed Edema Control - Lymphedema / Segmental Compressive Device / Other: Optional: One layer of unna paste to top of compression wrap (to act as an anchor). Patient to wear own Velcro compression garment. Remove compression stockings every night before going to bed and put on every morning when getting up. - juxtalite wrap for right lower leg Elevate, Exercise Daily and Avoid Standing for Long Periods of Time. Elevate legs to the level of the heart and pump ankles as often as possible Elevate leg(s) parallel to the floor when sitting. DO YOUR BEST to sleep in the bed at night. DO NOT sleep in your recliner. Long hours of sitting in a recliner leads to swelling of the legs and/or potential wounds on your backside. Additional Orders / Instructions: Follow Nutritious Diet and Increase Protein Intake Medications-Please add to medication list.: Topical Antibiotic - gentamycin and Mupirocin to wound WOUND #8: - Lower Leg Wound Laterality: Left, Lateral Cleanser: Wound Cleanser 3 x Per Week/30 Days NAYLAH, STORTS ([id number]) [id number] Page 12 of 13 Discharge Instructions: Wash your hands with soap and water. Remove old dressing, discard into plastic bag and place into trash. Cleanse the wound with Wound Cleanser prior to applying a clean dressing using gauze sponges, not tissues or cotton balls. Do not scrub or use excessive force. Pat dry using gauze sponges, not tissue or cotton balls. Topical: Triamcinolone Acetonide Cream, 0.1%, 15 (g) tube 3 x Per Week/30 Days Discharge Instructions: Mix 1:1 with Nystatin cream and apply to periwound and leg Prim Dressing: AquacelAg Advantage Dressing, 2X2 (in/in) 3 x Per Week/30 Days ary Discharge Instructions: Apply to wound as directed Secondary Dressing: ABD Pad 5x9 (in/in) 3 x Per Week/30 Days Discharge Instructions: Cover with ABD pad Com pression Wrap: 3-LAYER WRAP -  Profore Lite LF 3 Multilayer Compression Bandaging System 3 x Per Week/30 Days Discharge Instructions: Apply 3 multi-layer wrap as prescribed. 1. Aquacel Ag with antibiotic ointment under 3 layer compressionooleft lower extremity 2. Follow-up in 1 week Electronic Signature(s) Signed: 02/14/2023 1:18:38 PM By: Kalman Shan DO Entered By: Kalman Shan on 02/14/2023 11:22:45 -------------------------------------------------------------------------------- SuperBill Details Patient Name: Date of Service: Laura Santiago. 02/14/2023 Medical Record Number: [id number] Patient Account Number: [id number] Date of Birth/Sex: Treating RN: 02-02-1946 (77 y.o. [name] Primary Care Provider: [place] Other Clinician: [name] Referring Provider: Treating Provider/Extender: [name] Weeks in Treatment: 88 Diagnosis Coding ICD-10 Codes Code Description [phone] 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 I87.311 Chronic venous hypertension (idiopathic) with ulcer of right lower extremity I87.2 Venous insufficiency (chronic) (peripheral) Z79.01 Long term (current) use of anticoagulants I10 Essential (primary) hypertension 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 Cellulitis of right lower limb S81.801A Unspecified open wound, right lower leg, initial encounter Facility Procedures : CPT4 Code: [id number] Description: [id number] - DEBRIDE WOUND 1ST 20 SQ CM OR < ICD-10 Diagnosis Description 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.312 Chronic venous hypertension (idiopathic)  with ulcer of left lower extremity Modifier: d Quantity: 1 Physician Procedures : CPT4 Code Description Modifier [id number] - WC PHYS DEBR WO ANESTH 20 SQ CM ICD-10 Diagnosis Description  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.312 Chronic venous hypertension (idiopathic) with  ulcer of left lower extremity JAMIRACLE, HAUSNER ([id number]) [id number] Pag Quantity: 1 e 13 of 13 Electronic Signature(s) Signed: 02/14/2023 1:18:38 PM By: Kalman Shan DO Entered By: Kalman Shan on 02/14/2023 11:23:04

## 2023-02-16 NOTE — Progress Notes (Signed)
PERSEIS, BARCLAY ([id number]) [id number] Page 1 of 9 Visit Report for 02/14/2023 Arrival Information Details Patient Name: Date of Service: Laura Ace. 02/14/2023 10:00 [place] Record Number: [id number] Patient Account Number: [id number] Date of Birth/Sex: Treating RN: 20-Oct-1946 (77 y.o. [name] Primary Care Talis Iwan: [place] Other Clinician: [name] Referring Donia Yokum: Treating Carlye Panameno/Extender: [name] Weeks in Treatment: 13 Visit Information History Since Last Visit All ordered tests and consults were completed: No Patient Arrived: [name] Added or deleted any medications: No Arrival Time: 10:36 Any new allergies or adverse reactions: No Transfer Assistance: None Had a fall or experienced change in No Patient Identification Verified: Yes activities of daily living that may affect Secondary Verification Process Completed: Yes risk of falls: Patient Requires Transmission-Based No Signs or symptoms of abuse/neglect since last visito No Precautions: Hospitalized since last visit: No Patient Has Alerts: Yes Implantable device outside of the clinic excluding No Patient Alerts: PT HAS SERVICE cellular tissue based products placed in the center ANIMAL since last visit: ABI 07/11/21 Pain Present Now: No R) 1.16 L) 1.27 Electronic Signature(s) Signed: 02/14/2023 4:49:21 PM By: [name] Entered By: [name] on 02/14/2023 10:49:28 -------------------------------------------------------------------------------- Clinic Level of Care Assessment Details Patient Name: Date of Service: Laura Ace. 02/14/2023 10:00 [place] Record Number: [id number] Patient Account Number: [id number] Date of Birth/Sex: Treating RN: 09-09-1946 (77 y.o. [name] Primary Care Bettylee Feig: [place] Other Clinician: [name] Referring Lynnetta Tom: Treating Inez Rosato/Extender:  [name] Weeks in Treatment: 79 Clinic Level of Care Assessment Items TOOL 1 Quantity Score '[]'$  - 0 Use when EandM and Procedure is performed on INITIAL visit ASSESSMENTS - Nursing Assessment / Reassessment '[]'$  - 0 General Physical Exam (combine w/ comprehensive assessment (listed just below) when performed on new pt. evals) '[]'$  - 0 Comprehensive Assessment (HX, ROS, Risk Assessments, Wounds Hx, etc.) DEANI, MELBOURNE ([id number]) [id number] Page 2 of 9 ASSESSMENTS - Wound and Skin Assessment / Reassessment '[]'$  - 0 Dermatologic / Skin Assessment (not related to wound area) ASSESSMENTS - Ostomy and/or Continence Assessment and Care '[]'$  - 0 Incontinence Assessment and Management '[]'$  - 0 Ostomy Care Assessment and Management (repouching, etc.) PROCESS - Coordination of Care '[]'$  - 0 Simple Patient / Family Education for ongoing care '[]'$  - 0 Complex (extensive) Patient / Family Education for ongoing care '[]'$  - 0 Staff obtains Programmer, systems, Records, T Results / Process Orders est '[]'$  - 0 Staff telephones HHA, Nursing Homes / Clarify orders / etc '[]'$  - 0 Routine Transfer to another Facility (non-emergent condition) '[]'$  - 0 Routine Hospital Admission (non-emergent condition) '[]'$  - 0 New Admissions / Biomedical engineer / Ordering NPWT Apligraf, etc. , '[]'$  - 0 Emergency Hospital Admission (emergent condition) PROCESS - Special Needs '[]'$  - 0 Pediatric / Minor Patient Management '[]'$  - 0 Isolation Patient Management '[]'$  - 0 Hearing / Language / Visual special needs '[]'$  - 0 Assessment of Community assistance (transportation, D/C planning, etc.) '[]'$  - 0 Additional assistance / Altered mentation '[]'$  - 0 Support Surface(s) Assessment (bed, cushion, seat, etc.) INTERVENTIONS - Miscellaneous '[]'$  - 0 External ear exam '[]'$  - 0 Patient Transfer (multiple staff / Civil Service fast streamer / Similar devices) '[]'$  - 0 Simple Staple / Suture removal (25 or  less) '[]'$  - 0 Complex Staple / Suture removal (26 or more) '[]'$  - 0 Hypo/Hyperglycemic Management (do not check if billed separately) '[]'$  -  0 Ankle / Brachial Index (ABI) - do not check if billed separately Has the patient been seen at the hospital within the last three years: Yes Total Score: 0 Level Of Care: ____ Electronic Signature(s) Signed: 02/14/2023 4:49:21 PM By: [name] Entered By: [name] on 02/14/2023 11:07:46 -------------------------------------------------------------------------------- Compression Therapy Details Patient Name: Date of Service: Laura Jewett [name] 02/14/2023 10:00 [place] Record Number: [id number] Patient Account Number: [id number] Date of Birth/Sex: Treating RN: 12-Jun-1946 (77 y.o. [name] Primary Care Scot Shiraishi: [place] Other Clinician: [name] Referring Evana Runnels: Treating Ardena Gangl/Extender: Kalman Shan [facility] Weeks in Treatment: [address], [place] ([id number]) [id number] Page 3 of 9 Compression Therapy Performed for Wound Assessment: Wound #8 Left,Lateral Lower Leg Performed By: [name], [name], Compression Type: Three Layer Pre Treatment ABI: 1.5 Post Procedure Diagnosis Same as Pre-procedure Electronic Signature(s) Signed: 02/14/2023 4:49:21 PM By: [name] Entered By: [name] on 02/14/2023 11:04:28 -------------------------------------------------------------------------------- Encounter Discharge Information Details Patient Name: Date of Service: Laura Jewett [name] 02/14/2023 10:00 [place] Record Number: [id number] Patient Account Number: [id number] Date of Birth/Sex: Treating RN: March 25, 1946 (77 y.o. [name] Primary Care Tion Tse: [place] Other Clinician: [name] Referring Keron Neenan: Treating Jamaurie Bernier/Extender: [name] Weeks in Treatment: 60 Encounter Discharge  Information Items Post Procedure Vitals Discharge Condition: Stable Temperature (F): 97.9 Ambulatory Status: Walker Pulse (bpm): 66 Discharge Destination: Home Respiratory Rate (breaths/min): 18 Transportation: Other Blood Pressure (mmHg): 151/76 Accompanied By: self Schedule Follow-up Appointment: Yes Clinical Summary of Care: Electronic Signature(s) Signed: 02/14/2023 4:49:21 PM By: [name] Entered By: [name] on 02/14/2023 11:47:35 -------------------------------------------------------------------------------- Lower Extremity Assessment Details Patient Name: Date of Service: Laura Ace. 02/14/2023 10:00 A M Medical Record Number: [id number] Patient Account Number: [id number] Date of Birth/Sex: Treating RN: [birth year] (77 y.o. [name] Primary Care Erich Kochan: [place] Other Clinician: [name] Referring Leota Maka: Treating Sally-Ann Cutbirth/Extender: Kalman Shan [facility] [place] in Treatment: [address] Edema Assessment L[LeftOMAIRA, Laura Santiago [id number] [[id number] Page 4 of 9] Assessed: [Left: Yes] [Right: No] Edema: [Left: Ye] [Right: s] Calf Left: Right: Point of Measurement: 35 cm From Medial Instep 33.3 cm Ankle Left: Right: Point of Measurement: 12 cm From Medial Instep 21 cm Vascular Assessment Pulses: Dorsalis Pedis Palpable: [Left:Yes] Electronic Signature(s) Signed: 02/14/2023 4:49:21 PM By: [name] Signed: 02/14/2023 9:49:41 PM By: Gretta Cool, BSN, RN, CWS, [name] RN, BSN Entered By: [name] on 02/14/2023 11:01:31 -------------------------------------------------------------------------------- Multi Wound Chart Details Patient Name: Date of Service: Laura Jewett [name] 02/14/2023 10:00 A M Medical Record Number: [id number] Patient Account Number: [id number] Date of Birth/Sex: Treating RN: 18-Jun-1946 (77 y.o. [name] Primary Care Cattaleya Wien: [place] Other  Clinician: [name] Referring Carina Chaplin: Treating Nereyda Bowler/Extender: [name] Weeks in Treatment: 33 Vital Signs Height(in): 66 Pulse(bpm): 66 Weight(lbs): 153 Blood Pressure(mmHg): 151/76 Body Mass Index(BMI): 24.7 Temperature(F): 97.9 Respiratory Rate(breaths/min): 18 [8:Photos:] [N/A:N/A] Left, Lateral Lower Leg N/A N/A Wound Location: Pressure Injury N/A N/A Wounding Event: Venous Leg Ulcer N/A N/A Primary Etiology: Hypertension, Osteoarthritis, ReceivedN/A N/A Comorbid History: Chemotherapy, Received Radiation 10/18/2022 N/A N/A Date Acquired: 17 N/A N/A Weeks of Treatment: Open N/A N/A Wound Status: No N/A N/A Wound Recurrence: 0.1x0.1x0.1 N/A N/A Measurements L x W x D (cm) 0.008 N/A N/A A (cm) : Laura Santiago, Laura Santiago ([id number]) [id number] Page 5 of 9  0.001 N/A N/A Volume (cm) : 99.80% N/A N/A % Reduction in Area: 99.90% N/A N/A % Reduction in Volume: Full Thickness Without Exposed N/A N/A Classification: Support Structures None Present N/A N/A Exudate A mount: Large (67-100%) N/A N/A Epithelialization: Treatment Notes Electronic Signature(s) Signed: 02/14/2023 4:49:21 PM By: [name] Entered By: [name] on 02/14/2023 11:01:39 -------------------------------------------------------------------------------- [place] Details Patient Name: Date of Service: Laura Jewett [name] 02/14/2023 10:00 [place] Record Number: [id number] Patient Account Number: [id number] Date of Birth/Sex: Treating RN: [birth year] (77 y.o. [name] Primary Care Gusta Marksberry: [place] Other Clinician: [name] Referring Mckensi Redinger: Treating Kyen Taite/Extender: [name] Weeks in Treatment: 60 Active Inactive Necrotic Tissue Nursing Diagnoses: Impaired tissue integrity related to necrotic/devitalized tissue Knowledge deficit  related to management of necrotic/devitalized tissue Goals: Necrotic/devitalized tissue will be minimized in the wound bed Date Initiated: 07/06/2021 Target Resolution Date: 12/13/2022 Goal Status: Active Patient/caregiver will verbalize understanding of reason and process for debridement of necrotic tissue Date Initiated: 07/06/2021 Date Inactivated: 10/05/2021 Target Resolution Date: 07/06/2021 Goal Status: Met Interventions: Assess patient pain level pre-, during and post procedure and prior to discharge Provide education on necrotic tissue and debridement process Treatment Activities: Apply topical anesthetic as ordered : 07/06/2021 Biologic debridement : 07/06/2021 Enzymatic debridement : 07/06/2021 Excisional debridement : 07/06/2021 Notes: Soft Tissue Infection Nursing Diagnoses: Impaired tissue integrity Potential for infection: soft tissue Goals: Patient's soft tissue infection will resolve Date Initiated: 03/15/2022 Target Resolution Date: 12/13/2022 Laura Santiago, Laura Santiago ([id number]) [phone].pdf Page 6 of 9 Goal Status: Active Signs and symptoms of infection will be recognized early to allow for prompt treatment Date Initiated: 03/15/2022 Target Resolution Date: 12/13/2022 Goal Status: Active Interventions: Assess signs and symptoms of infection every visit Treatment Activities: Culture and sensitivity : 03/15/2022 Notes: Electronic Signature(s) Signed: 02/14/2023 4:49:21 PM By: [name] Signed: 02/14/2023 9:49:41 PM By: Gretta Cool, BSN, RN, CWS, [name] RN, BSN Entered By: [name] on 02/14/2023 11:45:56 -------------------------------------------------------------------------------- Pain Assessment Details Patient Name: Date of Service: Laura Jewett [name] 02/14/2023 10:00 A M Medical Record Number: [id number] Patient Account Number: [id number] Date of Birth/Sex: Treating RN: 1946/04/11 (77 y.o. [name] Primary Care Jerret Mcbane: [place] Other Clinician: [name] Referring Marlayna Bannister: Treating Sheketa Ende/Extender: [name] Weeks in Treatment: 8 Active Problems Location of Pain Severity and Description of Pain Patient Has Paino No Site Locations Pain Management and Medication Current Pain Management: Electronic Signature(s) Signed: 02/14/2023 4:49:21 PM By: [name] Signed: 02/14/2023 9:49:41 PM By: Gretta Cool, BSN, RN, CWS, [name] RN, BSN Entered By: [name] on 02/14/2023 10:51:28 Laura Santiago ([id number] Page 7 of 9 -------------------------------------------------------------------------------- Patient/Caregiver Education Details Patient Name: Date of Service: Laura Ace 3/6/2024andnbsp10:00 [place] Record Number: [id number] Patient Account Number: [id number] Date of Birth/Gender: Treating RN: 11-25-1946 (77 y.o. [name] Primary Care Physician: [place] Other Clinician: [name] Referring Physician: Treating Physician/Extender: [place], [place] Weeks in Treatment: 73 Education Assessment Education Provided To: Patient Education Topics Provided Wound/Skin Impairment: Handouts: Other: continue wound care as directed Methods: Explain/Verbal Responses: State content correctly Electronic Signature(s) Signed: 02/14/2023 4:49:21 PM By: [name] Entered By: [name] on 02/14/2023 11:46:26 -------------------------------------------------------------------------------- Wound Assessment Details Patient Name: Date of Service: Laura Jewett [name] 02/14/2023 10:00 A M Medical Record Number: [id number] Patient Account Number: [id number] Date of Birth/Sex: Treating RN: Nov 03, 1946 (76 y.o.  [name] Primary Care Aneli Zara: [place] Other Clinician: [name] Referring Chidubem Chaires: Treating Elward Nocera/Extender: [name] Weeks in Treatment: 55 Wound Status Wound Number: 8 Primary Venous Leg Ulcer Etiology: Wound Location: Left, Lateral Lower Leg Wound Status: Open Wounding Event: Pressure Injury Comorbid Hypertension, Osteoarthritis, Received Chemotherapy, Date Acquired: 10/18/2022 History: Received Radiation Weeks Of Treatment: 17 Clustered Wound: No Photos Laura Santiago, Laura Santiago ([id number]) [phone].pdf Page 8 of 9 Wound Measurements Length: (cm) 0.1 Width: (cm) 0.1 Depth: (cm) 0.1 Area: (cm) 0.008 Volume: (cm) 0.001 % Reduction in Area: 99.8% % Reduction in Volume: 99.9% Epithelialization: Large (67-100%) Wound Description Classification: Full Thickness Without Exposed Support Exudate Amount: None Present Structures Treatment Notes Wound #8 (Lower Leg) Wound Laterality: Left, Lateral Cleanser Wound Cleanser Discharge Instruction: Wash your hands with soap and water. Remove old dressing, discard into plastic bag and place into trash. Cleanse the wound with Wound Cleanser prior to applying a clean dressing using gauze sponges, not tissues or cotton balls. Do not scrub or use excessive force. Pat dry using gauze sponges, not tissue or cotton balls. Peri-Wound Care Topical Triamcinolone Acetonide Cream, 0.1%, 15 (g) tube Discharge Instruction: Mix 1:1 with Nystatin cream and apply to periwound and leg Primary Dressing AquacelAg Advantage Dressing, 2X2 (in/in) Discharge Instruction: Apply to wound as directed Secondary Dressing ABD Pad 5x9 (in/in) Discharge Instruction: Cover with ABD pad Secured With Compression Wrap 3-LAYER WRAP - Profore Lite LF 3 Multilayer Compression Bandaging System Discharge Instruction: Apply 3 multi-layer wrap as prescribed. Compression Stockings Add-Ons Electronic Signature(s) Signed: 02/14/2023 4:49:21 PM By: [name] Signed: 02/14/2023 9:49:41 PM By: Gretta Cool, BSN, RN, CWS, [name] RN, BSN Entered By: [name] on 02/14/2023  10:59:09 Laura Santiago ([id number]) [id number] Page 9 of 9 -------------------------------------------------------------------------------- Vitals Details Patient Name: Date of Service: Laura Ace. 02/14/2023 10:00 [place] Record Number: [id number] Patient Account Number: [id number] Date of Birth/Sex: Treating RN: [birth year] (77 y.o. [name] Primary Care Nayelie Gionfriddo: [place] Other Clinician: [name] Referring Anasia Agro: Treating Oneal Biglow/Extender: [name] Weeks in Treatment: 30 Vital Signs Time Taken: 10:49 Temperature (F): 97.9 Height (in): 66 Pulse (bpm): 66 Weight (lbs): 153 Respiratory Rate (breaths/min): 18 Body Mass Index (BMI): 24.7 Blood Pressure (mmHg): 151/76 Reference Range: 80 - 120 mg / dl Electronic Signature(s) Signed: 02/14/2023 4:49:21 PM By: [name] Entered By: [name] on 02/14/2023 10:51:24

## 2023-02-20 ENCOUNTER — Other Ambulatory Visit: Payer: Self-pay | Admitting: Oncology

## 2023-02-20 ENCOUNTER — Encounter (HOSPITAL_BASED_OUTPATIENT_CLINIC_OR_DEPARTMENT_OTHER): Payer: Medicare Other | Admitting: Internal Medicine

## 2023-02-20 DIAGNOSIS — I87313 Chronic venous hypertension (idiopathic) with ulcer of bilateral lower extremity: Secondary | ICD-10-CM | POA: Diagnosis not present

## 2023-02-20 DIAGNOSIS — I87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: Secondary | ICD-10-CM

## 2023-02-20 DIAGNOSIS — L97822 Non-pressure chronic ulcer of other part of left lower leg with fat layer exposed: Secondary | ICD-10-CM | POA: Diagnosis not present

## 2023-02-21 ENCOUNTER — Ambulatory Visit: Payer: Medicare Other | Admitting: Internal Medicine

## 2023-02-21 ENCOUNTER — Other Ambulatory Visit: Payer: Self-pay

## 2023-02-21 ENCOUNTER — Other Ambulatory Visit: Payer: Self-pay | Admitting: Nurse Practitioner

## 2023-02-21 ENCOUNTER — Other Ambulatory Visit: Payer: Self-pay | Admitting: Hospice and Palliative Medicine

## 2023-02-21 DIAGNOSIS — R530 Neoplastic (malignant) related fatigue: Secondary | ICD-10-CM

## 2023-02-21 MED ORDER — OXYCODONE HCL 10 MG PO TABS: 15.0000 mg | ORAL_TABLET | Freq: Four times a day (QID) | ORAL | 0 refills | Status: DC | PRN

## 2023-02-21 MED ORDER — AMPHETAMINE-DEXTROAMPHETAMINE 20 MG PO TABS: 20.0000 mg | ORAL_TABLET | Freq: Every day | ORAL | 0 refills | Status: AC

## 2023-02-21 NOTE — Progress Notes (Signed)
Spoke to patient's daughter. Patient requests to continue adderall 30 mg d/t improvement in fatigue and quality of life. However, medication was not recommended by cardiology and Dr Janese Banks had previously documented discontinuation. I recommend decreasing dose to minimum effect dose as we weigh risks and benefits. Daughter agrees. Will send prescription for adderall 20 mg daily. She also requests to move follow up appt with Dr. Janese Banks until after her scheduled echo on 4/17. Will send schedule message. Also requesting refills of pain medication and I've messaged Josh Borders who is managing her pain to assist.

## 2023-02-21 NOTE — Progress Notes (Addendum)
Laura Santiago, Laura Santiago ([id number]) [id number] Page 1 of 11 Visit Report for 02/20/2023 Chief Complaint Document Details Patient Name: Date of Service: [name] 02/20/2023 4:15 PM Medical Record Number: [id number] Patient Account Number: [id number] Date of Birth/Sex: Treating RN: [birth year] (77 y.o. Laura Santiago Primary Care Provider: House Other Clinician: Referring Provider: Treating Provider/Extender: [name] Weeks in Treatment: 28 Information Obtained from: Patient Chief Complaint Left lower extremity wound Right toe wounds Left upper lateral thigh wounds Electronic Signature(s) Signed: 02/20/2023 5:13:45 PM By: Rosalio Loud MSN RN CNS WTA Signed: 02/22/2023 3:53:59 PM By: Kalman Shan DO Previous Signature: 02/20/2023 4:50:32 PM Version By: Kalman Shan DO Entered By: Rosalio Loud on 02/20/2023 17:13:45 -------------------------------------------------------------------------------- HPI Details Patient Name: Date of Service: Laura Santiago Laura M. 02/20/2023 4:15 PM Medical Record Number: [id number] Patient Account Number: [id number] Date of Birth/Sex: Treating RN: Apr 27, 1946 (77 y.o. Laura Santiago Primary Care Provider: [place] Other Clinician: Referring Provider: Treating Provider/Extender: Kalman Shan [facility] Weeks in Treatment: 31 History of Present Illness HPI Description: Admission 7/27 Ms. Laura Santiago is a 77 year old female with a past medical history of ADHD, metastatic breast cancer, stage IV chronic kidney disease, history of DVT on Xarelto and chronic venous insufficiency that presents to the clinic for a chronic left lower extremity wound. She recently moved to [facility] 4 days ago. She was being followed by wound care center in [name]. She reports a 10-year history of wounds to her left lower extremity that eventually do heal  with debridement and compression therapy. She states that the current wound reopened 4 months ago and she is using Vaseline and Coban. She denies signs of infection. 8/3; patient presents for 1 week follow-up. She reports no issues or complaints today. She states she had vascular studies done in the last week. She denies signs of infection. She brought her little service dog with her today. 8/17; patient presents for follow-up. She has missed her last clinic appointment. She states she took the wrap off and attempted to rewrap her leg. She is having difficulty with transportation. She has her service dog with her today. Overall she feels well and reports improvement in wound healing. She denies signs of infection. She reports owning an old Velcro wrap compression and has this at her living facility Laura Santiago, Laura Santiago [id number] ([id number]) [phone].pdf Page 2 of 11 9/14; patient presents for follow-up. Patient states that over the past 2 to 3 weeks she developed toe wounds to her right foot. She attributes this to tight fitting shoes. She subsequently developed cellulitis in the right leg and has been treated by doxycycline by her oncologist. She reports improvement in symptoms however continues to have some redness and swelling to this leg. T the left lower extremity patient has been having her wraps changed with home health twice weekly. She states that the [facility] is not helping control o the drainage. Other than that she has no issues or complaints today. She denies signs of infection to the left lower extremity. 9/21; patient presents for follow-up. She reports seeing infectious disease for her cellulitis. She reports no further management. She has home health that changes the wraps twice weekly. She has no issues or complaints today. She denies signs of infection. 10/5; patient presents for follow-up. She has no issues or complaints today. She denies signs of infection.  She states that the right great toe has not been dressed  by home health. 10/12; patient presents for follow-up. She has no issues or complaints today. She reports improvement in her wound healing. She has been using silver alginate to the right great toe wound. She denies signs of infection. 10/26; patient presents for follow-up. Home health did not have sorbact so they continued to use Hydrofera Blue under the wrap. She has been using silver alginate to the great toe wound however she did not have a dressing in place today. She currently denies signs of infection. 11/2; patient presents for follow-up. She has been using sorb act under the compression wrap. She reports using silver alginate to the toe wound again she does not have a dressing in place. She currently denies signs of infection. 11/23; patient presents for follow-up. Unfortunately she has missed her last 2 clinic appointments. She was last seen 3 weeks ago. She did her own compression wrap with Kerlix and Coban yesterday after seeing vein and vascular. She has not been dressing her right great toe wound. She currently denies signs of infection. 11/30; patient presents for 1 week follow-up. She states she changed her dressing last week prior to home health and use sorb act with Dakin's and Hydrofera Blue. Home health has changed the dressing as well and they have been using sorbact. T oday she reports increased redness to her right lower extremity. She has a history of cellulitis to this leg. She has been using silver alginate to the right great toe. Unfortunately she had an episode of diarrhea prior to coming in and had feces all over the right leg and to the wrap of her left leg. 12/7; patient presents for 1 week follow-up. She states that home health did not come out to change the dressing and she took it off yesterday. It is unclear if she is dressing the right toe wound. She denies signs of infection. 12/14; patient presents for 1  week follow-up. She has no issues or complaints today. 12/21; patient presents for follow-up. She has no issues or complaints today. She denies signs of infection. 12/28/2021; patient presents for follow-up. She was hospitalized for sepsis secondary to right lower extremity cellulitis On 12/23. She states she is currently at a SNF. She states that she was started on doxycycline this morning for her right great toe swelling and redness. She is not sure what dressings have been done to her left lower extremity for the past 3 weeks. She says its been mainly gauze with an Ace wrap. 1/25; patient presents for follow-up. She is still residing in a skilled nursing facility. She reports mild pain to the left lower extremity wound bed. She states she is going to see a podiatrist soon. 2/8; patient presents for follow-up. She has moved back to her residential community from her skilled nursing facility. She has no issues or complaints today. She denies signs of systemic infections. 2/15; patient presents for follow-up. He has no issues or complaints today. She denies systemic signs of infection. 2/22; patient presents for follow-up. She has no issues or complaints today. She denies signs of infection. 3/1; patient presents for follow-up. She states that home health came out the day after she was seen in our clinic and yesterday to do the wrap change. She denies signs of infection. She reports excoriated skin on the ankle. 3/8; patient presents for follow-up. She has no issues or complaints today. She denies signs of infection. 3/15; patient presents for follow-up. Home health has been coming out to change the dressings. She  reports more tenderness to the wound site. She denies purulent drainage, increased warmth or erythema to the area. 4/5; patient presents for follow-up. She has missed her last 2 clinic appointments. I have not seen her in 3 weeks. She was recently hospitalized for altered mental status. She  was involuntarily committed. She was evaluated by psychiatry and deemed to have competency. There was no specific cause of her altered mental status. It was concluded that her physical and mental health were declining due to her chronic medical conditions. Currently home health has been coming out for dressing changes. Patient has also been doing her own dressing changes. She reports more skin breakdown to the periwound and now has a new wound. She denies fever/chills. She reports continued tenderness to the wound site. 4/12; patient with significant venous insufficiency and a large wound on her left lower leg taking up about 80% of the circumference of her lower leg. Cultures of this grew MRSA and Pseudomonas. She had completed a course of ciprofloxacin now is starting doxycycline. She has been using Dakin's wet-to-dry and a Tubigrip. She has home health twice a week and we change it once. 4/19; patient presents for follow-up. She completed her course of doxycycline. She has been using Dakin's wet-to-dry dressing and Tubigrip. Home health changes the dressing twice weekly. Currently she has no issues or complaints. 4/26; patient presents for follow-up. At last clinic visit orders for home health were Iodosorb under compression therapy. Unfortunately they did not have the dressing and have been using Dakin's and gentamicin under the wrap. Patient currently denies signs of infection. She has no issues or complaints today. 5/3; patient presents for follow-up. Again Iodosorb has not been used under the compression therapy when home health comes out to change the wrap and dressing. They have been using Sorbact. It is unclear why this is happening since we send orders weekly to the agency. She denies signs of infection. Patient has not purchased the [place] antibiotics. We reached out to the company and they said they have been trying to contact her on a regular basis. We gave the patient the number to  call to order the medication. 5/10; patient presents for follow-up. She has no issues or complaints today. Again home health has not been using Iodosorb. Mepilex was on the wound bed. No other dressings noted. She brought in her Keystone antibiotics. She denies signs of infection. 5/17; patient presents for follow-up. Home health has come out twice since she was last seen. Joint well she has been using Keystone antibiotic with Sorbact under the compression wrap. She has no issues or complaints today. She denies signs of infection. 5/24; patient presents for follow-up. We have been using Keystone antibiotics with Sorbact under compression therapy. She is tolerating the treatment well. She is reporting improvement in wound healing. She denies signs of infection. 5/31; patient presents for follow-up. We continue to do [facility] antibiotics with Sorbact under compression therapy. She continues to report improvement in wound healing. Home health comes out and changes the dressing once weekly. Laura Santiago, Laura Santiago ([id number]) [id number] Page 3 of 11 05-17-2022 upon evaluation today patient appears to be doing better in regard to her wound especially compared to the last time I saw her. Fortunately I do think that she is seeing improvements. With that being said I do believe that she may be benefit from sharp debridement today to clear away some of the necrotic debris I discussed that with her as well. She is an amendable to  that plan. Otherwise she is very pleased with how the [employer] is doing for her. 6/14; patient presents for follow-up. We have been using Keystone antibiotic with Sorbact and absorbent dressings under 3 layer compression. She has no issues or complaints today. She reports improvement in wound healing. She denies signs of infection. 6/21; patient presents for follow-up. We are continuing with [facility] antibiotic and Sorbact under 3 layer compression. Patient has no  complaints. Continued wound healing is happening. She denies signs of infection. 6/28; patient presents for follow-up. We have been using Keystone antibiotic with Sorbact under 3 layer compression. Usually home health comes out and changes the dressing twice a week. Unfortunately they did not go out to change the dressing. It is unclear why. Patient did not call them. She currently denies signs of infection. 7/5; patient presents for follow-up. We have been using Keystone antibiotic with calcium alginate under 3 layer compression. She reports improvement in wound healing. She denies signs of infection. Home health has come out to do dressing changes twice this past week. 7/12; patient presents for follow-up. We have been using Keystone antibiotic with calcium alginate under 3 layer compression. Patient states that home health came out once last week to change the dressing. She reports improvement in wound healing. She currently denies signs of infection. 7/19; patient presents for follow-up. We have been using Keystone antibiotic with calcium alginate under 3 layer compression. Home health came out once last week to change the dressing. She has no issues or complaints today. She denies signs of infection. 8/2; patient presents for follow-up. We have been using Keystone antibiotic with calcium alginate under 3 layer compression. Unfortunately she missed her appointment last week and home health did not come out to do dressing changes. Patient currently denies signs of infection. 8/9; patient presents for follow-up. We have been using Keystone with calcium alginate under 3 layer compression. She states that home health came out once last week. She currently denies signs of infection. Her wrap was completely wet. She states she was cleaning the top of the leg and water soaked down into the wrap. 8/16; patient presents for follow-up. We have been using Keystone with calcium alginate under 3 layer  compression. She states that home health came out twice last week. She has no issues or complaints today. 8/23; patient presents for follow-up. He has been using Keystone with calcium alginate under 3 layer compression. Home health came out twice last week. She denies signs of infection. 8/30; patient presents for follow-up. We have been using Keystone with calcium alginate under 3 layer compression. Home health came out once last week to change the dressing. Patient reports improvement in wound healing. She states she is almost done with her chemotherapy infusions and has 1 more left. 9/13; patient presents for follow-up. She has lost the capsules to her [facility] antibiotic which I believe is the vancomycin pills. She has her Zosyn powder today. We have been using Keystone antibiotic ointment with calcium alginate under 3 layer compression. She is concerned about systemic infection however her vitals are stable and there is no surrounding soft tissue infection. She would like to remain a patient in our wound care center however would like a second opinion for her wound care at another facility. She asked to be referred to [facility] wound care center. 9/20; patient presents for follow-up. She found her vancomycin capsules and brought in her complete Keystone antibiotic ointment set today. Unfortunately she has developed skin breakdown and Erythema to  the right lower extremity With increased swelling. She states she went to a pow wow Over the weekend and was on her feet for extended periods of time. She saw her oncologist yesterday who prescribed her doxycycline for her right lower extremity erythema. 9/27; patient presents for follow-up. We have been using Keystone antibiotic with Aquacel under 3 layer compression to the lower extremities bilaterally. When home health came and changed the wrap she secretly put coffee into the spray mix along with [facility] antibiotic on her leg thinking the acidic  component would better activate the zoysn (sonething she discussed with her microbiologist brother). She has reported improvement in wound healing. 10/4; patient presents for follow-up. She has no issues or complaints today. We have been doing Aquacel and keystone under 3 layer compression to the lower extremities bilaterally. This morning she took the right lower extremity wrap off as it was uncomfortable. She has no open wounds to this leg. 10/11; patient presents for follow-up. We have been doing Aquacel with Keystone antibiotic ointment under 3 layer compression to the left lower extremity. She developed a small blister to the anterior aspect of the left leg noticed when the wrap was taken off on intake. She currently denies signs of infection. 10/18; patient presents for follow-up. We have been doing Aquacel with Keystone antibiotic ointment under 3 layer compression to the left lower extremity. There has been continued improvement in wound healing. She denies signs of infection. 10/25; patient arrives for treatment of venous insufficiency ulcers on her left lower leg both lateral and medial are remanence of apparently a circumferential wound. Much improved. We are using topicals Keystone and Aquacel Ag under 3 layer compression we continue to make good progress. The patient talk to me at some length with regards to different things she has on her forehead and her Peri orbital area for which she is apparently applying [place]. She feels that what ever we are treating on her wounds is a more systemic problem. I really was not able to get a handle on what she is talking about however I did caution her not to put the [place] in her eyes. 11/1; her wounds continue to improve she is using Keystone and Aquacel Ag G under 3 layer compression. Our intake nurse notes erythema and edema in the right leg. The patient has a litany of concerns with regards to a rash on her forehead or ears and other systemic  complaints. She has an appointment with dermatology on November 11 11/8; patient presents for follow-up. We have been using Keystone and Aquacel under 4-layer compression. She has no issues or complaints today. She reports improvement in wound healing. 11/15; patient presents for follow-up. We have been using Aquacel with Keystone antibiotic under 3 layer compression. Patient continues improvement in wound healing. 12/6; patient presents for follow-up. We have been using Aquacel with Keystone antibiotic ointment under 3 layer compression. Wounds appear well-healing. 12/13; patient presents for follow-up. We have been using Aquacel with Keystone antibiotic under 3 layer compression. She has no issues or complaints today. 12/27 left lateral medial ankle. Superficial wounds remain there is significantly improved we are using Keystone backed with Zetuvit under 4-layer compression 1/3; patient presents for follow-up. Her wounds appear well-healing. We have been using Aquacel Ag with Keystone antibiotic ointment under compression therapy. This should be a 3 layer compression. 1/17; patient presents for follow-up. Her wounds on her left lower extremity are well-healing. We are using Aquacel Ag and Keystone antibiotic ointment under compression  therapy. She missed her last clinic appointment. She states that the wrap has been changed twice weekly since she was last seen. Unfortunately she has developed increased warmth and redness to the right lower extremity consistent with cellulitis. She states this started a few days ago. Laura Santiago, Laura Santiago ([id number]) [id number] Page 4 of 11 1/24; patient presents for follow-up. Her wounds on the left lower extremity are well-healing with Aquacel and Keystone antibiotic ointment under compression therapy. I prescribed Keflex at last clinic visit for cellulitis of the right lower extremity. Unfortunately this has not resolved. She did not  follow-up with her PCP for contact our office about her symptoms. 1/31; patient presents for follow-up. We have been using Aquacel and Keystone antibiotic ointment under compression therapy to the left lower extremity. Wounds appear well-healing. She has been taking clindamycin for the past week. Her symptoms have improved slightly with a decrease in erythema and warmth to the right lower extremity. She says her pain level has improved. However Symptoms have not completely resolved. She denies fever/chills, nausea/vomiting. 2/7; patient presents for follow-up. We have been using Aquacel Ag with Keystone antibiotic ointment under compression therapy to the left lower extremity. For the past week home health has not been using Keystone antibiotic ointment. She continues to take clindamycin. Her symptoms have improved greatly with the decrease in erythema and warmth to the right lower extremity. She denies systemic signs of infection. She has an area of skin breakdown to the right anterior leg. 2/14; Patient presents for follow-up. She has been using Hydrofera Blue to the right anterior leg under Tubigrip. It looks like Hydrofera Blue is also being used with Keystone to the left lower extremity under compression therapy. Order is for [place] Ag. Overall wounds appear well healing. She has no issues or complaints. 2/21; patient presents for follow-up. We have been using Hydrofera Blue under 3 layer compression to the lower extremities bilaterally. Her right lower extremity wounds have healed. She has a small open wound remaining to her left lateral leg. 2/28; patient presents for follow-up. We have been using Hydrofera Blue under 3 layer compression. Home health has changed the dressing to the left lower extremity however started the wrap at the ankle. 3/6; patient presents for follow-up. We have been using PolyMem with antibiotic ointment under 3 layer compression to the left lower extremity. She has  no issues or complaints today. 3/12; patient presents for follow-up. We have been using Aquacel Ag under 3 layer compression to the left lower extremity. Her wound is healed. She has juxta lite compression wraps at home. Electronic Signature(s) Signed: 02/20/2023 4:50:32 PM By: Kalman Shan DO Entered By: Kalman Shan on 02/20/2023 16:45:23 -------------------------------------------------------------------------------- Physical Exam Details Patient Name: Date of Service: Laura Santiago Laura M. 02/20/2023 4:15 PM Medical Record Number: [id number] Patient Account Number: [id number] Date of Birth/Sex: Treating RN: Jun 03, 1946 (76 y.o. Laura Santiago Primary Care Provider: [place] Other Clinician: Referring Provider: Treating Provider/Extender: [name] Weeks in Treatment: 78 Constitutional . Cardiovascular . Psychiatric . Notes T the left lateral leg there is epithelization to the previous wound site. Good edema control. No signs of infection. o Electronic Signature(s) Signed: 02/20/2023 4:50:32 PM By: Kalman Shan DO Entered By: Kalman Shan on 02/20/2023 16:47:18 [name] ([id number]) [id number] Page 5 of 11 -------------------------------------------------------------------------------- Physician Orders Details Patient Name: Date of Service: [name] 02/20/2023 4:15 PM Medical Record Number: [id number] Patient Account Number: [id number] Date of  Birth/Sex: Treating RN: [birth year] (77 y.o. Laura Santiago Primary Care Provider: [place] Other Clinician: Referring Provider: Treating Provider/Extender: [name] Weeks in Treatment: 3 Verbal / Phone Orders: No Diagnosis Coding [facility]charge From [facility] Discharge from [place] Treatment Complete - Tubi grip DD today. Put on JuxtaLite wrap with black sock from here on  out Follow-up Appointments Nurse Visit as needed [place] DISCONTINUE Home Health for [place]. - [place] services not needed. Wounds are healed **Please direct any NON-WOUND related issues/requests for orders to patient's Primary Care Physician. **If current dressing causes regression in wound condition, may D/C ordered dressing product/s and apply Normal Saline Moist Dressing daily until next [place] or Other MD appointment. **Notify Wound Healing Center of regression in wound condition at [phone]. Bathing/ Shower/ Hygiene May shower; gently cleanse wound with antibacterial soap, rinse and pat dry prior to dressing wounds Edema Control - Lymphedema / Segmental Compressive Device / Other Patient to wear own Velcro compression garment. Remove compression stockings every night before going to bed and put on every morning when getting up. - juxtalite wrap for left lower leg Elevate, Exercise Daily and A void Standing for Long Periods of Time. Elevate legs to the level of the heart and pump ankles as often as possible Elevate leg(s) parallel to the floor when sitting. DO YOUR BEST to sleep in the bed at night. DO NOT sleep in your recliner. Long hours of sitting in a recliner leads to swelling of the legs and/or potential wounds on your backside. Additional Orders / Instructions Follow Nutritious Diet and Increase Protein Intake Electronic Signature(s) Signed: 02/20/2023 5:23:46 PM By: Rosalio Loud MSN RN CNS WTA Signed: 02/22/2023 3:53:59 PM By: Kalman Shan DO Previous Signature: 02/20/2023 4:50:32 PM Version By: Kalman Shan DO Entered By: Rosalio Loud on 02/20/2023 17:11:40 -------------------------------------------------------------------------------- Problem List Details Patient Name: Date of Service: Laura Santiago Laura M. 02/20/2023 4:15 PM Medical Record Number: [id number] Patient Account Number: [id number] Date of  Birth/Sex: Treating RN: 11-26-1946 (77 y.o. Laura Santiago Primary Care Provider: Floyd Other Clinician: SINDI, Laura Santiago ([id number]) [id number] Page 6 of 11 Referring Provider: Treating Provider/Extender: [name] Weeks in Treatment: 68 Active Problems ICD-10 Encounter Code Description Active Date MDM Diagnosis 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sed11/23/2022 No Yes 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 11/02/2021 No Yes I87.311 Chronic venous hypertension (idiopathic) with ulcer of right lower extremity 08/30/2022 No Yes I87.2 Venous insufficiency (chronic) (peripheral) 07/06/2021 No Yes Z79.01 Long term (current) use of anticoagulants 07/06/2021 No Yes I10 Essential (primary) hypertension 07/06/2021 No Yes 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 07/06/2021 No Yes L03.115 Cellulitis of right lower limb 12/27/2022 No Yes S81.801A Unspecified open wound, right lower leg, initial encounter 01/17/2023 No Yes Inactive Problems ICD-10 Code Description Active Date Inactive Date S81.802A Unspecified open wound, left lower leg, initial encounter 07/06/2021 07/06/2021 S91.101A Unspecified open wound of right great toe without damage to nail, initial encounter 08/24/2021 08/24/2021 S91.104A Unspecified open wound of right lesser toe(s) without damage to nail, initial encounter 08/24/2021 08/24/2021 Resolved Problems ICD-10 Code Description Active Date Resolved Date S91.104D Unspecified open wound of right lesser toe(s) without damage to nail, subsequent 08/31/2021 08/31/2021 encounter S91.201D Unspecified open wound of right great toe with damage to nail, subsequent encounter 08/31/2021 08/31/2021 Electronic  Signature(s) Signed: 02/20/2023 5:13:39 PM By: Rosalio Loud MSN RN CNS WTA Signed: 02/22/2023 3:53:59 PM By: Jake Shark [phone] By: Kalman Shan DO [id number] Page 7 of 11 Signed: 02/22/2023 3:53:59 Previous Signature: 02/20/2023 4:50:32 PM Version By: Kalman Shan DO Entered By: Rosalio Loud on 02/20/2023 17:13:38 -------------------------------------------------------------------------------- Progress Note Details Patient Name: Date of Service: Laura Santiago Laura M. 02/20/2023 4:15 PM Medical Record Number: [id number] Patient Account Number: [id number] Date of Birth/Sex: Treating RN: August 24, 1946 (77 y.o. Laura Santiago Primary Care Provider: [employer] Other Clinician: Referring Provider: Treating Provider/Extender: [name] Weeks in Treatment: 75 Subjective Chief Complaint Information obtained from Patient Left lower extremity wound Right toe wounds Left upper lateral thigh wounds History of Present Illness (HPI) Admission 7/27 Ms. Jadira Nygren is a 77 year old female with a past medical history of ADHD, metastatic breast cancer, stage IV chronic kidney disease, history of DVT on Xarelto and chronic venous insufficiency that presents to the clinic for a chronic left lower extremity wound. She recently moved to [facility] 4 days ago. She was being followed by wound care center in [name]. She reports a 10-year history of wounds to her left lower extremity that eventually do heal with debridement and compression therapy. She states that the current wound reopened 4 months ago and she is using Vaseline and Coban. She denies signs of infection. 8/3; patient presents for 1 week follow-up. She reports no issues or complaints today. She states she had vascular studies done in the last week. She denies signs of infection. She brought her little service dog with her today. 8/17; patient presents for follow-up. She has missed her last clinic appointment. She states she took the wrap off and attempted  to rewrap her leg. She is having difficulty with transportation. She has her service dog with her today. Overall she feels well and reports improvement in wound healing. She denies signs of infection. She reports owning an old Velcro wrap compression and has this at her living facility 9/14; patient presents for follow-up. Patient states that over the past 2 to 3 weeks she developed toe wounds to her right foot. She attributes this to tight fitting shoes. She subsequently developed cellulitis in the right leg and has been treated by doxycycline by her oncologist. She reports improvement in symptoms however continues to have some redness and swelling to this leg. T the left lower extremity patient has been having her wraps changed with home health twice weekly. She states that the [facility] is not helping control o the drainage. Other than that she has no issues or complaints today. She denies signs of infection to the left lower extremity. 9/21; patient presents for follow-up. She reports seeing infectious disease for her cellulitis. She reports no further management. She has home health that changes the wraps twice weekly. She has no issues or complaints today. She denies signs of infection. 10/5; patient presents for follow-up. She has no issues or complaints today. She denies signs of infection. She states that the right great toe has not been dressed by home health. 10/12; patient presents for follow-up. She has no issues or complaints today. She reports improvement in her wound healing. She has been using silver alginate to the right great toe wound. She denies signs of infection. 10/26; patient presents for follow-up. Home health did not have sorbact so they continued to use Hydrofera Blue under the wrap. She has been using silver  alginate to the great toe wound however she did not have a dressing in place today. She currently denies signs of infection. 11/2; patient presents for  follow-up. She has been using sorb act under the compression wrap. She reports using silver alginate to the toe wound again she does not have a dressing in place. She currently denies signs of infection. 11/23; patient presents for follow-up. Unfortunately she has missed her last 2 clinic appointments. She was last seen 3 weeks ago. She did her own compression wrap with Kerlix and Coban yesterday after seeing vein and vascular. She has not been dressing her right great toe wound. She currently denies signs of infection. 11/30; patient presents for 1 week follow-up. She states she changed her dressing last week prior to home health and use sorb act with Dakin's and Hydrofera Blue. Home health has changed the dressing as well and they have been using sorbact. T oday she reports increased redness to her right lower extremity. She has a history of cellulitis to this leg. She has been using silver alginate to the right great toe. Unfortunately she had an episode of diarrhea prior to coming in and had feces all over the right leg and to the wrap of her left leg. 12/7; patient presents for 1 week follow-up. She states that home health did not come out to change the dressing and she took it off yesterday. It is unclear if she is dressing the right toe wound. She denies signs of infection. 12/14; patient presents for 1 week follow-up. She has no issues or complaints today. 12/21; patient presents for follow-up. She has no issues or complaints today. She denies signs of infection. 12/28/2021; patient presents for follow-up. She was hospitalized for sepsis secondary to right lower extremity cellulitis On 12/23. She states she is currently at a MAELEE, Laura Santiago ([id number]) [id number] Page 8 of 11 SNF. She states that she was started on doxycycline this morning for her right great toe swelling and redness. She is not sure what dressings have been done to her left lower extremity for  the past 3 weeks. She says its been mainly gauze with an Ace wrap. 1/25; patient presents for follow-up. She is still residing in a skilled nursing facility. She reports mild pain to the left lower extremity wound bed. She states she is going to see a podiatrist soon. 2/8; patient presents for follow-up. She has moved back to her residential community from her skilled nursing facility. She has no issues or complaints today. She denies signs of systemic infections. 2/15; patient presents for follow-up. He has no issues or complaints today. She denies systemic signs of infection. 2/22; patient presents for follow-up. She has no issues or complaints today. She denies signs of infection. 3/1; patient presents for follow-up. She states that home health came out the day after she was seen in our clinic and yesterday to do the wrap change. She denies signs of infection. She reports excoriated skin on the ankle. 3/8; patient presents for follow-up. She has no issues or complaints today. She denies signs of infection. 3/15; patient presents for follow-up. Home health has been coming out to change the dressings. She reports more tenderness to the wound site. She denies purulent drainage, increased warmth or erythema to the area. 4/5; patient presents for follow-up. She has missed her last 2 clinic appointments. I have not seen her in 3 weeks. She was recently hospitalized for altered mental status. She was involuntarily committed. She was evaluated by  psychiatry and deemed to have competency. There was no specific cause of her altered mental status. It was concluded that her physical and mental health were declining due to her chronic medical conditions. Currently home health has been coming out for dressing changes. Patient has also been doing her own dressing changes. She reports more skin breakdown to the periwound and now has a new wound. She denies fever/chills. She reports continued tenderness to the  wound site. 4/12; patient with significant venous insufficiency and a large wound on her left lower leg taking up about 80% of the circumference of her lower leg. Cultures of this grew MRSA and Pseudomonas. She had completed a course of ciprofloxacin now is starting doxycycline. She has been using Dakin's wet-to-dry and a Tubigrip. She has home health twice a week and we change it once. 4/19; patient presents for follow-up. She completed her course of doxycycline. She has been using Dakin's wet-to-dry dressing and Tubigrip. Home health changes the dressing twice weekly. Currently she has no issues or complaints. 4/26; patient presents for follow-up. At last clinic visit orders for home health were Iodosorb under compression therapy. Unfortunately they did not have the dressing and have been using Dakin's and gentamicin under the wrap. Patient currently denies signs of infection. She has no issues or complaints today. 5/3; patient presents for follow-up. Again Iodosorb has not been used under the compression therapy when home health comes out to change the wrap and dressing. They have been using Sorbact. It is unclear why this is happening since we send orders weekly to the agency. She denies signs of infection. Patient has not purchased the [place] antibiotics. We reached out to the company and they said they have been trying to contact her on a regular basis. We gave the patient the number to call to order the medication. 5/10; patient presents for follow-up. She has no issues or complaints today. Again home health has not been using Iodosorb. Mepilex was on the wound bed. No other dressings noted. She brought in her Keystone antibiotics. She denies signs of infection. 5/17; patient presents for follow-up. Home health has come out twice since she was last seen. Joint well she has been using Keystone antibiotic with Sorbact under the compression wrap. She has no issues or complaints today. She denies  signs of infection. 5/24; patient presents for follow-up. We have been using Keystone antibiotics with Sorbact under compression therapy. She is tolerating the treatment well. She is reporting improvement in wound healing. She denies signs of infection. 5/31; patient presents for follow-up. We continue to do [facility] antibiotics with Sorbact under compression therapy. She continues to report improvement in wound healing. Home health comes out and changes the dressing once weekly. 05-17-2022 upon evaluation today patient appears to be doing better in regard to her wound especially compared to the last time I saw her. Fortunately I do think that she is seeing improvements. With that being said I do believe that she may be benefit from sharp debridement today to clear away some of the necrotic debris I discussed that with her as well. She is an amendable to that plan. Otherwise she is very pleased with how the [employer] is doing for her. 6/14; patient presents for follow-up. We have been using Keystone antibiotic with Sorbact and absorbent dressings under 3 layer compression. She has no issues or complaints today. She reports improvement in wound healing. She denies signs of infection. 6/21; patient presents for follow-up. We are continuing with [facility] antibiotic  and Sorbact under 3 layer compression. Patient has no complaints. Continued wound healing is happening. She denies signs of infection. 6/28; patient presents for follow-up. We have been using Keystone antibiotic with Sorbact under 3 layer compression. Usually home health comes out and changes the dressing twice a week. Unfortunately they did not go out to change the dressing. It is unclear why. Patient did not call them. She currently denies signs of infection. 7/5; patient presents for follow-up. We have been using Keystone antibiotic with calcium alginate under 3 layer compression. She reports improvement in wound healing. She denies signs of  infection. Home health has come out to do dressing changes twice this past week. 7/12; patient presents for follow-up. We have been using Keystone antibiotic with calcium alginate under 3 layer compression. Patient states that home health came out once last week to change the dressing. She reports improvement in wound healing. She currently denies signs of infection. 7/19; patient presents for follow-up. We have been using Keystone antibiotic with calcium alginate under 3 layer compression. Home health came out once last week to change the dressing. She has no issues or complaints today. She denies signs of infection. 8/2; patient presents for follow-up. We have been using Keystone antibiotic with calcium alginate under 3 layer compression. Unfortunately she missed her appointment last week and home health did not come out to do dressing changes. Patient currently denies signs of infection. 8/9; patient presents for follow-up. We have been using Keystone with calcium alginate under 3 layer compression. She states that home health came out once last week. She currently denies signs of infection. Her wrap was completely wet. She states she was cleaning the top of the leg and water soaked down into the wrap. 8/16; patient presents for follow-up. We have been using Keystone with calcium alginate under 3 layer compression. She states that home health came out twice last week. She has no issues or complaints today. 8/23; patient presents for follow-up. He has been using Keystone with calcium alginate under 3 layer compression. Home health came out twice last week. She denies signs of infection. 8/30; patient presents for follow-up. We have been using Keystone with calcium alginate under 3 layer compression. Home health came out once last week to change the dressing. Patient reports improvement in wound healing. She states she is almost done with her chemotherapy infusions and has 1 more left. Laura Santiago, Laura Santiago ([id number]) [id number] Page 9 of 11 9/13; patient presents for follow-up. She has lost the capsules to her [facility] antibiotic which I believe is the vancomycin pills. She has her Zosyn powder today. We have been using Keystone antibiotic ointment with calcium alginate under 3 layer compression. She is concerned about systemic infection however her vitals are stable and there is no surrounding soft tissue infection. She would like to remain a patient in our wound care center however would like a second opinion for her wound care at another facility. She asked to be referred to [facility] wound care center. 9/20; patient presents for follow-up. She found her vancomycin capsules and brought in her complete Keystone antibiotic ointment set today. Unfortunately she has developed skin breakdown and Erythema to the right lower extremity With increased swelling. She states she went to a pow wow Over the weekend and was on her feet for extended periods of time. She saw her oncologist yesterday who prescribed her doxycycline for her right lower extremity erythema. 9/27; patient presents for follow-up. We have been using Keystone antibiotic with  Aquacel under 3 layer compression to the lower extremities bilaterally. When home health came and changed the wrap she secretly put coffee into the spray mix along with [facility] antibiotic on her leg thinking the acidic component would better activate the zoysn (sonething she discussed with her microbiologist brother). She has reported improvement in wound healing. 10/4; patient presents for follow-up. She has no issues or complaints today. We have been doing Aquacel and keystone under 3 layer compression to the lower extremities bilaterally. This morning she took the right lower extremity wrap off as it was uncomfortable. She has no open wounds to this leg. 10/11; patient presents for follow-up. We have been doing Aquacel with Keystone  antibiotic ointment under 3 layer compression to the left lower extremity. She developed a small blister to the anterior aspect of the left leg noticed when the wrap was taken off on intake. She currently denies signs of infection. 10/18; patient presents for follow-up. We have been doing Aquacel with Keystone antibiotic ointment under 3 layer compression to the left lower extremity. There has been continued improvement in wound healing. She denies signs of infection. 10/25; patient arrives for treatment of venous insufficiency ulcers on her left lower leg both lateral and medial are remanence of apparently a circumferential wound. Much improved. We are using topicals Keystone and Aquacel Ag under 3 layer compression we continue to make good progress. The patient talk to me at some length with regards to different things she has on her forehead and her Peri orbital area for which she is apparently applying [place]. She feels that what ever we are treating on her wounds is a more systemic problem. I really was not able to get a handle on what she is talking about however I did caution her not to put the [place]te in her eyes. 11/1; her wounds continue to improve she is using Keystone and Aquacel Ag G under 3 layer compression. Our intake nurse notes erythema and edema in the right leg. The patient has a litany of concerns with regards to a rash on her forehead or ears and other systemic complaints. She has an appointment with dermatology on November 11 11/8; patient presents for follow-up. We have been using Keystone and Aquacel under 4-layer compression. She has no issues or complaints today. She reports improvement in wound healing. 11/15; patient presents for follow-up. We have been using Aquacel with Keystone antibiotic under 3 layer compression. Patient continues improvement in wound healing. 12/6; patient presents for follow-up. We have been using Aquacel with Keystone antibiotic ointment under  3 layer compression. Wounds appear well-healing. 12/13; patient presents for follow-up. We have been using Aquacel with Keystone antibiotic under 3 layer compression. She has no issues or complaints today. 12/27 left lateral medial ankle. Superficial wounds remain there is significantly improved we are using Keystone backed with Zetuvit under 4-layer compression 1/3; patient presents for follow-up. Her wounds appear well-healing. We have been using Aquacel Ag with Keystone antibiotic ointment under compression therapy. This should be a 3 layer compression. 1/17; patient presents for follow-up. Her wounds on her left lower extremity are well-healing. We are using Aquacel Ag and Keystone antibiotic ointment under compression therapy. She missed her last clinic appointment. She states that the wrap has been changed twice weekly since she was last seen. Unfortunately she has developed increased warmth and redness to the right lower extremity consistent with cellulitis. She states this started a few days ago. 1/24; patient presents for follow-up. Her wounds on the  left lower extremity are well-healing with Aquacel and Keystone antibiotic ointment under compression therapy. I prescribed Keflex at last clinic visit for cellulitis of the right lower extremity. Unfortunately this has not resolved. She did not follow-up with her PCP for contact our office about her symptoms. 1/31; patient presents for follow-up. We have been using Aquacel and Keystone antibiotic ointment under compression therapy to the left lower extremity. Wounds appear well-healing. She has been taking clindamycin for the past week. Her symptoms have improved slightly with a decrease in erythema and warmth to the right lower extremity. She says her pain level has improved. However Symptoms have not completely resolved. She denies fever/chills, nausea/vomiting. 2/7; patient presents for follow-up. We have been using Aquacel Ag with Keystone  antibiotic ointment under compression therapy to the left lower extremity. For the past week home health has not been using Keystone antibiotic ointment. She continues to take clindamycin. Her symptoms have improved greatly with the decrease in erythema and warmth to the right lower extremity. She denies systemic signs of infection. She has an area of skin breakdown to the right anterior leg. 2/14; Patient presents for follow-up. She has been using Hydrofera Blue to the right anterior leg under Tubigrip. It looks like Hydrofera Blue is also being used with Keystone to the left lower extremity under compression therapy. Order is for [place] Ag. Overall wounds appear well healing. She has no issues or complaints. 2/21; patient presents for follow-up. We have been using Hydrofera Blue under 3 layer compression to the lower extremities bilaterally. Her right lower extremity wounds have healed. She has a small open wound remaining to her left lateral leg. 2/28; patient presents for follow-up. We have been using Hydrofera Blue under 3 layer compression. Home health has changed the dressing to the left lower extremity however started the wrap at the ankle. 3/6; patient presents for follow-up. We have been using PolyMem with antibiotic ointment under 3 layer compression to the left lower extremity. She has no issues or complaints today. 3/12; patient presents for follow-up. We have been using Aquacel Ag under 3 layer compression to the left lower extremity. Her wound is healed. She has juxta lite compression wraps at home. [address] MALAYIAH, Laura Santiago ([id number]) [id number] Page 10 of 11 Constitutional Vitals Time Taken: 4:22 PM, Height: 66 in, Weight: 153 lbs, BMI: 24.7, Temperature: 98.2 F, Pulse: 61 bpm, Respiratory Rate: 18 breaths/min, Blood Pressure: 118/64 mmHg. General Notes: T the left lateral leg there is epithelization to the previous wound site. Good edema  control. No signs of infection. o Integumentary (Hair, Skin) Wound #8 status is Healed - Epithelialized. Original cause of wound was Pressure Injury. The date acquired was: 10/18/2022. The wound has been in treatment 17 weeks. The wound is located on the Left,Lateral Lower Leg. The wound measures 0cm length x 0cm width x 0cm depth; 0cm^2 area and 0cm^3 volume. There is a none present amount of drainage noted. 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 Her wound is healed. I recommended she wear her juxta lite  compression wraps daily. She did not have them in office today and we use Tubigrip. Plan Discharge From [facility] Services: Discharge from [place] [facility] - Tubi grip DD today. Put on JuxtaLite wrap with black sock from here on out Follow-up Appointments: Nurse Visit as needed [place] DISCONTINUE [place] for [place]. - [place] services not needed. Wounds are healed **Please direct any NON-WOUND related issues/requests for orders to patient's Primary Care Physician. **If current dressing causes regression in wound condition, may D/C ordered dressing product/s and apply Normal Saline Moist Dressing daily until next [place] or Other MD appointment. **Notify [facility] of regression in wound condition at [phone]. Bathing/ Shower/  Hygiene: May shower; gently cleanse wound with antibacterial soap, rinse and pat dry prior to dressing wounds Edema Control - Lymphedema / Segmental Compressive Device / Other: Patient to wear own Velcro compression garment. Remove compression stockings every night before going to bed and put on every morning when getting up. - juxtalite wrap for left lower leg Elevate, Exercise Daily and Avoid Standing for Long Periods of Time. Elevate legs to the level of the heart and pump ankles as often as possible Elevate leg(s) parallel to the floor when sitting. DO YOUR BEST to sleep in the bed at night. DO NOT sleep in your recliner. Long hours of sitting in a recliner leads to swelling of the legs and/or potential wounds on your backside. Additional Orders / Instructions: Follow Nutritious Diet and Increase Protein Intake WOUND #8: - Lower Leg Wound Laterality: Left, Lateral Cleanser: Wound Cleanser 3 x Per Week/30 Days Discharge Instructions: Wash your hands with soap and water. Remove old dressing, discard into plastic bag and place into trash. Cleanse the wound with Wound Cleanser prior to applying a clean dressing using gauze sponges, not tissues or cotton balls. Do not scrub or use excessive force. Pat dry using gauze sponges, not tissue or cotton balls. Topical: Triamcinolone Acetonide Cream, 0.1%, 15 (g) tube 3 x Per Week/30 Days Discharge Instructions: Mix 1:1 with Nystatin cream and apply to periwound and leg Prim Dressing: AquacelAg Advantage Dressing, 2X2 (in/in) 3 x Per Week/30 Days ary Discharge Instructions: Apply to wound as directed Secondary Dressing: ABD Pad 5x9 (in/in) 3 x Per Week/30 Days Discharge Instructions: Cover with ABD pad Com pression Wrap: 3-LAYER WRAP - Profore Lite LF 3 Multilayer Compression Bandaging System 3 x Per Week/30 Days Discharge Instructions: Apply 3 multi-layer wrap as prescribed. 1. Discharge from clinic due to closed wound 2. Follow-up as needed 3.  Juxta light compression daily. Electronic Signature(s) Laura Santiago, Laura Santiago [place] ([id number]) [id number] Page 11 of 11 Signed: 02/20/2023 4:50:32 PM By: Kalman Shan DO Entered By: Kalman Shan on 02/20/2023 16:49:46 -------------------------------------------------------------------------------- SuperBill Details Patient Name: Date of Service: Laura Santiago, BO Laura M. 02/20/2023 Medical Record Number: [id number] Patient Account Number: [id number] Date of Birth/Sex: Treating RN: 1946-03-22 (76 y.o. Laura Santiago Primary Care Provider: [place] Other Clinician: Referring Provider: Treating Provider/Extender: [name] Weeks in Treatment: 81 Diagnosis Coding ICD-10 Codes Code Description [phone] 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 I87.311 Chronic venous hypertension (idiopathic) with ulcer of right lower extremity I87.2 Venous insufficiency (chronic) (peripheral) Z79.01 Long term (current) use of anticoagulants I10 Essential (primary) hypertension 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 Cellulitis of right lower limb  S81.801A Unspecified open wound, right lower leg, initial encounter Facility Procedures : CPT4 Code: [id number] Description: [phone] - WOUND CARE VISIT-LEV 2 EST PT Modifier: Quantity: 1 Physician Procedures : CPT4 Code Description Modifier [id number] - WC PHYS LEVEL 3 - EST PT ICD-10 Diagnosis Description 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.312 Chronic venous hypertension (idiopathic) with ulcer  of left lower extremity Quantity: 1 Electronic Signature(s) Signed: 02/20/2023 5:12:26 PM By: Rosalio Loud MSN RN CNS WTA Signed: 02/22/2023 3:53:59 PM By: Kalman Shan DO Previous Signature: 02/20/2023  4:50:32 PM Version By: Kalman Shan DO Entered By: Rosalio Loud on 02/20/2023 17:12:25

## 2023-02-22 ENCOUNTER — Ambulatory Visit: Payer: Medicare Other | Admitting: Dermatology

## 2023-02-23 NOTE — Progress Notes (Signed)
Laura Santiago, Laura Santiago ([id number]) [id number] Page 1 of 8 Visit Report for 02/20/2023 Arrival Information Details Patient Name: Date of Service: Laura Ace. 02/20/2023 4:15 PM Medical Record Number: [id number] Patient Account Number: [id number] Date of Birth/Sex: Treating RN: [birth year] (77 y.o. Laura Santiago Primary Care Laura Santiago: [place] Other Clinician: Referring Laura Santiago: Treating Laura Santiago/Extender: Laura Santiago [facility] Weeks in Treatment: 63 Visit Information History Since Last Visit Added or deleted any medications: No Patient Arrived: [place] Has Dressing in Place as Prescribed: Yes Arrival Time: 16:16 Pain Present Now: No Accompanied By: self Transfer Assistance: None Patient Identification Verified: Yes Secondary Verification Process Completed: Yes Patient Requires Transmission-Based No Precautions: Patient Has Alerts: Yes Patient Alerts: PT HAS SERVICE ANIMAL ABI 07/11/21 R) 1.16 L) 1.27 Electronic Signature(s) Signed: 02/20/2023 5:07:41 PM By: Laura Loud MSN RN CNS WTA Entered By: Laura Santiago on 02/20/2023 17:07:41 -------------------------------------------------------------------------------- Clinic Level of Care Assessment Details Patient Name: Date of Service: Laura Ace. 02/20/2023 4:15 PM Medical Record Number: [id number] Patient Account Number: [id number] Date of Birth/Sex: Treating RN: Apr 13, 1946 (77 y.o. Laura Santiago Primary Care Laura Santiago: [place] Other Clinician: Referring Laura Santiago: Treating Laura Santiago/Extender: Laura Santiago [facility] Weeks in Treatment: 84 Clinic Level of Care Assessment Items TOOL 4 Quantity Score X- 1 0 Use when only an EandM is performed on FOLLOW-UP visit ASSESSMENTS - Nursing Assessment / Reassessment X- 1 10 Reassessment of Co-morbidities (includes updates in patient status) X- 1 5 Reassessment of Adherence to Treatment  Plan Laura Santiago, Laura Santiago ([id number]) [id number] Page 2 of 8 ASSESSMENTS - Wound and Skin A ssessment / Reassessment X - Simple Wound Assessment / Reassessment - one wound 1 5 []  - 0 Complex Wound Assessment / Reassessment - multiple wounds []  - 0 Dermatologic / Skin Assessment (not related to wound area) ASSESSMENTS - Focused Assessment []  - 0 Circumferential Edema Measurements - multi extremities []  - 0 Nutritional Assessment / Counseling / Intervention []  - 0 Lower Extremity Assessment (monofilament, tuning fork, pulses) []  - 0 Peripheral Arterial Disease Assessment (using hand held doppler) ASSESSMENTS - Ostomy and/or Continence Assessment and Care []  - 0 Incontinence Assessment and Management []  - 0 Ostomy Care Assessment and Management (repouching, etc.) PROCESS - Coordination of Care X - Simple Patient / Family Education for ongoing care 1 15 []  - 0 Complex (extensive) Patient / Family Education for ongoing care X- 1 10 Staff obtains Programmer, systems, Records, T Results / Process Orders est []  - 0 Staff telephones HHA, Nursing Homes / Clarify orders / etc []  - 0 Routine Transfer to another Facility (non-emergent condition) []  - 0 Routine Hospital Admission (non-emergent condition) []  - 0 New Admissions / Biomedical engineer / Ordering NPWT Apligraf, etc. , []  - 0 Emergency Hospital Admission (emergent condition) X- 1 10 Simple Discharge Coordination []  - 0 Complex (extensive) Discharge Coordination PROCESS - Special Needs []  - 0 Pediatric / Minor Patient Management []  - 0 Isolation Patient Management []  - 0 Hearing / Language / Visual special needs []  - 0 Assessment of Community assistance (transportation, D/C planning, etc.) []  - 0 Additional assistance / Altered mentation []  - 0 Support Surface(s) Assessment (bed, cushion, seat, etc.) INTERVENTIONS - Wound Cleansing / Measurement X - Simple Wound Cleansing - one wound 1 5 []  -  0 Complex Wound Cleansing - multiple wounds X- 1 5 Wound Imaging (photographs - any number of wounds) []  - 0 Wound Tracing (instead of photographs) X-  1 5 Simple Wound Measurement - one wound []  - 0 Complex Wound Measurement - multiple wounds INTERVENTIONS - Wound Dressings []  - 0 Small Wound Dressing one or multiple wounds []  - 0 Medium Wound Dressing one or multiple wounds []  - 0 Large Wound Dressing one or multiple wounds []  - 0 Application of Medications - topical []  - 0 Application of Medications - injection INTERVENTIONS - Miscellaneous []  - 0 External ear exam Laura Santiago, Laura Santiago ([id number]) [id number] Page 3 of 8 []  - 0 Specimen Collection (cultures, biopsies, blood, body fluids, etc.) []  - 0 Specimen(s) / Culture(s) sent or taken to Lab for analysis []  - 0 Patient Transfer (multiple staff / Laura Santiago Lift / Similar devices) []  - 0 Simple Staple / Suture removal (25 or less) []  - 0 Complex Staple / Suture removal (26 or more) []  - 0 Hypo / Hyperglycemic Management (close monitor of Blood Glucose) []  - 0 Ankle / Brachial Index (ABI) - do not check if billed separately X- 1 5 Vital Signs Has the patient been seen at the hospital within the last three years: Yes Total Score: 75 Level Of Care: New/Established - Level 2 Electronic Signature(s) Signed: 02/20/2023 5:23:46 PM By: Laura Loud MSN RN CNS WTA Entered By: Laura Santiago on 02/20/2023 17:12:11 -------------------------------------------------------------------------------- Encounter Discharge Information Details Patient Name: Date of Service: Laura Santiago [name] 02/20/2023 4:15 PM Medical Record Number: [id number] Patient Account Number: [id number] Date of Birth/Sex: Treating RN: Sep 06, 1946 (77 y.o. Laura Santiago Primary Care Daivion Pape: [place] Other Clinician: Referring Annastyn Silvey: Treating Daoud Lobue/Extender: Laura Santiago [facility] Weeks in Treatment:  71 Encounter Discharge Information Items Discharge Condition: Stable Ambulatory Status: [place] Discharge Destination: Home Transportation: Private Auto Accompanied By: self Schedule Follow-up Appointment: No Clinical Summary of Care: Electronic Signature(s) Signed: 02/20/2023 5:14:39 PM By: Laura Loud MSN RN CNS WTA Entered By: Laura Santiago on 02/20/2023 17:14:39 -------------------------------------------------------------------------------- Lower Extremity Assessment Details Patient Name: Date of Service: Laura Santiago, BO [name] 02/20/2023 4:15 PM Laura Santiago ([id number]) [id number] Page 4 of 8 Medical Record Number: [id number] Patient Account Number: [id number] Date of Birth/Sex: Treating RN: 1946/03/30 (77 y.o. Laura Santiago Primary Care Merita Hawks: [place] Other Clinician: Referring Dereke Neumann: Treating Arnetha Silverthorne/Extender: Laura Santiago [facility] Weeks in Treatment: 65 Edema Assessment Assessed: [Left: No] [Right: No] [Left: Edema] [Right: :] Calf Left: Right: Point of Measurement: 35 cm From Medial Instep 31.5 cm Ankle Left: Right: Point of Measurement: 12 cm From Medial Instep 20.5 cm Vascular Assessment Pulses: Dorsalis Pedis Palpable: [Left:Yes] Electronic Signature(s) Signed: 02/20/2023 5:11:09 PM By: Laura Loud MSN RN CNS WTA Previous Signature: 02/20/2023 5:08:01 PM Version By: Laura Loud MSN RN CNS WTA Entered By: Laura Santiago on 02/20/2023 17:11:09 -------------------------------------------------------------------------------- Multi Wound Chart Details Patient Name: Date of Service: Laura Santiago, Laura Rubenstein [name] 02/20/2023 4:15 PM Medical Record Number: [id number] Patient Account Number: [id number] Date of Birth/Sex: Treating RN: 30-Jan-1946 (77 y.o. Laura Santiago Primary Care Zenita Kister: [place] Other Clinician: Referring Malcolm Quast: Treating Kaidence Sant/Extender: Laura Santiago [facility] Weeks in Treatment: 6 Vital Signs Height(in): 66 Pulse(bpm): 61 Weight(lbs): 153 Blood Pressure(mmHg): 118/64 Body Mass Index(BMI): 24.7 Temperature(F): 98.2 Respiratory Rate(breaths/min): 18 [8:Photos:] [N/A:N/A] Left, Lateral Lower Leg N/A N/A Wound Location: Pressure Injury N/A N/A Wounding Event: Laura Santiago, Laura Santiago ([id number]) [id number] Page 5 of 8 Venous Leg Ulcer N/A N/A Primary Etiology: Hypertension, Osteoarthritis, ReceivedN/A N/A Comorbid History: Chemotherapy, Received Radiation 10/18/2022 N/A N/A Date  Acquired: 17 N/A N/A Weeks of Treatment: Healed - Epithelialized N/A N/A Wound Status: No N/A N/A Wound Recurrence: 0x0x0 N/A N/A Measurements L x W x D (cm) 0 N/A N/A A (cm) : rea 0 N/A N/A Volume (cm) : 100.00% N/A N/A % Reduction in Area: 100.00% N/A N/A % Reduction in Volume: Full Thickness Without Exposed N/A N/A Classification: Support Structures None Present N/A N/A Exudate Amount: Fascia: No N/A N/A Exposed Structures: Fat Layer (Subcutaneous Tissue): No Tendon: No Muscle: No Joint: No Bone: No Large (67-100%) N/A N/A Epithelialization: Treatment Notes Electronic Signature(s) Signed: 02/20/2023 5:11:15 PM By: Laura Loud MSN RN CNS WTA Previous Signature: 02/20/2023 5:08:07 PM Version By: Laura Loud MSN RN CNS WTA Entered By: Laura Santiago on 02/20/2023 17:11:15 -------------------------------------------------------------------------------- Multi-Disciplinary Care Plan Details Patient Name: Date of Service: Laura Santiago, Laura Rubenstein [name] 02/20/2023 4:15 PM Medical Record Number: [id number] Patient Account Number: [id number] Date of Birth/Sex: Treating RN: February 03, 1946 (77 y.o. Laura Santiago Primary Care Kinston Magnan: [place] Other Clinician: Referring Clotilde Loth: Treating Kalif Kattner/Extender: Laura Santiago [facility] Weeks in Treatment: 70 Active Inactive Electronic  Signature(s) Signed: 02/20/2023 5:13:17 PM By: Laura Loud MSN RN CNS WTA Previous Signature: 02/20/2023 5:12:42 PM Version By: Laura Loud MSN RN CNS WTA Entered By: Laura Santiago on 02/20/2023 17:13:16 Pain Assessment Details -------------------------------------------------------------------------------- Laura Santiago ([id number]) [id number] Page 6 of 8 Patient Name: Date of Service: Laura Ace. 02/20/2023 4:15 PM Medical Record Number: [id number] Patient Account Number: [id number] Date of Birth/Sex: Treating RN: 05/01/1946 (77 y.o. Laura Santiago Primary Care Cristin Szatkowski: [place] Other Clinician: Referring Lew Prout: Treating Daryl Quiros/Extender: Laura Santiago [facility] Weeks in Treatment: 34 Active Problems Location of Pain Severity and Description of Pain Patient Has Paino No Site Locations Pain Management and Medication Current Pain Management: Electronic Signature(s) Signed: 02/20/2023 5:07:49 PM By: Laura Loud MSN RN CNS WTA Entered By: Laura Santiago on 02/20/2023 17:07:49 -------------------------------------------------------------------------------- Patient/Caregiver Education Details Patient Name: Date of Service: Laura Santiago [name] 3/12/2024andnbsp4:15 PM Medical Record Number: [id number] Patient Account Number: [id number] Date of Birth/Gender: Treating RN: 22-Dec-1945 (77 y.o. Laura Santiago Primary Care Physician: [place] Other Clinician: Referring Physician: Treating Physician/Extender: [place], [place] in Treatment: 12 Education Assessment Education Provided To: Patient Education Topics Provided Engineer, maintenance) Signed: 02/20/2023 5:23:46 PM By: Laura Loud MSN RN CNS WTA Entered By: Laura Santiago on 02/20/2023 17:13:24 Laura Santiago ([id number]) [id number] Page 7 of  8 -------------------------------------------------------------------------------- Wound Assessment Details Patient Name: Date of Service: Laura Ace. 02/20/2023 4:15 PM Medical Record Number: [id number] Patient Account Number: [id number] Date of Birth/Sex: Treating RN: [birth year] (77 y.o. Laura Santiago Primary Care Alva Broxson: [place] Other Clinician: Referring Awais Cobarrubias: Treating Riggins Cisek/Extender: Laura Santiago [facility] Weeks in Treatment: 109 Wound Status Wound Number: 8 Primary Venous Leg Ulcer Etiology: Wound Location: Left, Lateral Lower Leg Wound Status: Healed - Epithelialized Wounding Event: Pressure Injury Comorbid Hypertension, Osteoarthritis, Received Chemotherapy, Date Acquired: 10/18/2022 History: Received Radiation Weeks Of Treatment: 17 Clustered Wound: No Photos Wound Measurements Length: (cm) Width: (cm) Depth: (cm) Area: (cm) Volume: (cm) 0 % Reduction in Area: 100% 0 % Reduction in Volume: 100% 0 Epithelialization: Large (67-100%) 0 0 Wound Description Classification: Full Thickness Without Exposed Support Exudate Amount: None Present Structures Wound Bed Exposed Structure Fascia Exposed: No Fat Layer (Subcutaneous Tissue) Exposed: No Tendon Exposed: No Muscle Exposed: No Joint  Exposed: No Bone Exposed: No Treatment Notes Wound #8 (Lower Leg) Wound Laterality: Left, Lateral Cleanser Peri-Wound Care Topical Laura Santiago, Laura Santiago ([id number]) [id number] Page 8 of 8 Primary Dressing Secondary Dressing Secured With Compression Wrap Compression Stockings Add-Ons Electronic Signature(s) Signed: 02/20/2023 5:23:46 PM By: Laura Loud MSN RN CNS WTA Entered By: Laura Santiago on 02/20/2023 16:41:59 -------------------------------------------------------------------------------- Vitals Details Patient Name: Date of Service: Laura Santiago, Laura Rubenstein [name] 02/20/2023 4:15 PM Medical Record Number:  [id number] Patient Account Number: [id number] Date of Birth/Sex: Treating RN: [birth year] (77 y.o. Laura Santiago Primary Care Travanti Mcmanus: [place] Other Clinician: Referring Lillybeth Tal: Treating Jaelee Laughter/Extender: Laura Santiago [facility] Weeks in Treatment: 106 Vital Signs Time Taken: 16:22 Temperature (F): 98.2 Height (in): 66 Pulse (bpm): 61 Weight (lbs): 153 Respiratory Rate (breaths/min): 18 Body Mass Index (BMI): 24.7 Blood Pressure (mmHg): 118/64 Reference Range: 80 - 120 mg / dl Electronic Signature(s) Signed: 02/20/2023 5:07:44 PM By: Laura Loud MSN RN CNS WTA Entered By: Laura Santiago on 02/20/2023 17:07:44

## 2023-02-28 ENCOUNTER — Ambulatory Visit: Payer: Medicare Other | Admitting: Internal Medicine

## 2023-03-04 ENCOUNTER — Other Ambulatory Visit: Payer: Self-pay

## 2023-03-04 ENCOUNTER — Emergency Department
Admission: EM | Admit: 2023-03-04 | Discharge: 2023-03-04 | Disposition: A | Discharge status: Home / Self Care | Payer: Medicare Other | Attending: Emergency Medicine | Admitting: Emergency Medicine

## 2023-03-04 DIAGNOSIS — I129 Hypertensive chronic kidney disease with stage 1 through stage 4 chronic kidney disease, or unspecified chronic kidney disease: Secondary | ICD-10-CM | POA: Diagnosis not present

## 2023-03-04 DIAGNOSIS — N184 Chronic kidney disease, stage 4 (severe): Secondary | ICD-10-CM | POA: Diagnosis not present

## 2023-03-04 DIAGNOSIS — L97921 Non-pressure chronic ulcer of unspecified part of left lower leg limited to breakdown of skin: Secondary | ICD-10-CM | POA: Diagnosis not present

## 2023-03-04 DIAGNOSIS — Z853 Personal history of malignant neoplasm of breast: Secondary | ICD-10-CM | POA: Diagnosis not present

## 2023-03-04 DIAGNOSIS — X58XXXA Exposure to other specified factors, initial encounter: Secondary | ICD-10-CM | POA: Insufficient documentation

## 2023-03-04 DIAGNOSIS — Z85830 Personal history of malignant neoplasm of bone: Secondary | ICD-10-CM | POA: Insufficient documentation

## 2023-03-04 DIAGNOSIS — S81802A Unspecified open wound, left lower leg, initial encounter: Secondary | ICD-10-CM | POA: Diagnosis present

## 2023-03-04 MED ORDER — CEPHALEXIN 500 MG PO CAPS: 500.0000 mg | ORAL_CAPSULE | Freq: Four times a day (QID) | ORAL | 0 refills | Status: AC

## 2023-03-04 MED ORDER — CEPHALEXIN 500 MG PO CAPS
500.0000 mg | ORAL_CAPSULE | Freq: Once | ORAL | Status: AC
  Administered 2023-03-04: 500 mg via ORAL
  Filled 2023-03-04: qty 1

## 2023-03-04 NOTE — Discharge Instructions (Addendum)
Please take the antibiotic 4 times a day for the next 7 days.  It is very important that you wear the compression sleeve and elevate the leg to help with swelling.  Please make an appointment with the wound care specialist.  Please also follow-up with dermatology.  If you develop increasing pain fevers or any other concerns please return to the emergency department.

## 2023-03-04 NOTE — ED Triage Notes (Signed)
Pt states a wound to the left leg. Pt state she was having wound care, finished it, and last night she noted the wound was open again, but did not feel it. Leg is wrapped up at this time.  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

## 2023-03-04 NOTE — ED Notes (Addendum)
56 yof with a c/c of left lower leg wound. The pt was warm, pink, and dry. The pt is alert and oriented x 4. The pt's lower leg was wrapped and a po antibiotic was provided.

## 2023-03-04 NOTE — ED Provider Notes (Signed)
[facility] Provider Note    Event Date/Time   First MD Initiated Contact with Patient 03/04/23 [phone]     (approximate)   History   Leg Injury   HPI  Laura Santiago is a 77 y.o. female with past medical history of stage IV breast cancer with mets to the lymph nodes and bone, DVT on Xarelto chronic venous stasis and chronic extremity ulcerations, hypertension CKD peripheral neuropathy who presents because of a wound on the left lower extremity.  Patient tells me that she has been dealing with a left lower extremity wound for about a year with wound care.  Wound was closed.  Last night she was putting on ketoconazole cream to her foot which is prescribed by dermatology for tinea when she realized that there was an open wound on part of the left lower extremity that is new.  She is concerned about this progressing and is concerned that she will need IV antibiotics.  Patient denies significant pain.  She does have peripheral neuropathy.  Denies fever chills or other systemic symptoms.  Patient has chronic venous stasis.  After the wound had healed she has not been wearing compression.  She denies any trauma to the leg.  She has been seeing wound care for some time for chronic wounds.  Note from 3/12 about 2 weeks ago said that they have been using Aquacel Ag under 3 layer compression to the left lower extremity and that the wound is healed     Past Medical History:  Diagnosis Date   ADHD (attention deficit hyperactivity disorder)    in [place], no date on md note   Anemia    IDA 11/26/2019, Anemia in stage 4 chronic kidney disease ([place]) 09/03/2020   Arthritis    osteoarthritis right knee 09/30/2014   Breast cancer ([place]) 10/05/2013   in [place], [place] -, Her 2 is 3+   Cancer related pain 03/28/2021   in [name], md notes spine mets   cervical compression fracture 03/18/2021   in [place]   DVT of lower extremity, bilateral ([place]) 03/30/2014   in [name]   Generalized muscle  weakness 03/31/2016   in [name]   GERD (gastroesophageal reflux disease) 05/15/2013   per md in [name]   Hyperparathyroidism, secondary ([place]) 04/25/2018   in [name]   Hypertension 02/20/2016   info from MD in [place] loss 02/05/2015   in [name]   Metabolic acidosis [id number]   in [place]   Metabolic syndrome 0000000   in [name]   Osteopenia after menopause 03/29/2016   in [name]   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   in [name]   Stasis ulcer of left lower extremity ([place]) 03/29/2016   in [name]    Patient Active Problem List   Diagnosis Date Noted   Neoplastic (malignant) related fatigue 02/21/2023   Non-prs chronic ulcer oth prt l low leg w fat layer exposed ([place]) 04/26/2022   Chronic venous hypertension w ulcer of l low extrem ([place]) 04/26/2022   Adverse effect of antineoplastic and immunosuppressive drugs, subsequent encounter 03/10/2022   Drug-induced polyneuropathy ([place]) 03/10/2022   Major depressive disorder, recurrent, 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   ADHD (attention deficit hyperactivity disorder) 03/09/2022   Major depressive disorder, recurrent episode, mild ([place]) 03/09/2022   Palliative care encounter    Stage 3b chronic kidney disease ([place]) 03/08/2022   Venous stasis ulcer of left lower leg with edema of left lower leg ([place]) 03/08/2022  Altered mental status, unspecified 03/08/2022   Altered mental status 03/08/2022   GAD (generalized anxiety disorder) 02/22/2022   Peripheral neuropathy due to chemotherapy ([place]) 02/22/2022   Personal history of DVT (deep vein thrombosis) 02/22/2022   Narcotic dependence ([place]) 02/22/2022   History of normocytic normochromic anemia 02/22/2022   Long term (current) use of anticoagulants 01/14/2022   Hypertensive chronic kidney disease w stg 1-4/unsp chr kdny 01/10/2022   Chronic kidney disease, stage 4 (severe) ([place]) 01/10/2022   Secondary malignant neoplasm of bone ([place]) 12/11/2021   SIRS (systemic inflammatory response syndrome) ([place])  12/02/2021   Venous stasis ulcer with fat layer exposed ([place]) 08/29/2021   Tinea pedis of both feet 08/29/2021   Cellulitis of right lower extremity 08/29/2021   Primary malignant neoplasm of breast with metastasis ([place]) 05/26/2021   Back pain 05/17/2021   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   Malignant neoplasm metastatic to bone ([place]) 04/27/2021   Primary insomnia 03/28/2021   Unsteady gait 03/28/2021   Lumbar compression fracture, open, initial encounter ([place]) 03/18/2021   Posterior vitreous detachment of both eyes 02/08/2021   Anemia in stage 3b chronic kidney disease ([place]) 09/03/2020   AKI (acute kidney injury) ([place]) 06/26/2020   Bacteriuria 11/26/2019   Iron deficiency anemia 11/26/2019   Venous stasis ulcer of left calf limited to breakdown of skin with varicose veins (HCC) 10/22/2018   Myopia of both eyes with astigmatism and presbyopia 05/27/2018   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 05/27/2018   Hyperparathyroidism, secondary ([place]) 04/25/2018   Lactic acidosis 01/27/2018   Generalized muscle weakness 03/31/2016   Attention deficit hyperactivity disorder (ADHD), predominantly inattentive type 03/29/2016   Osteopenia 03/29/2016   Essential hypertension 0000000   Metabolic syndrome 0000000   Chronic anticoagulation 11/16/2015   Right leg swelling 11/16/2015   Memory loss 02/05/2015   Atrophy of muscle of multiple sites 09/30/2014   Physical deconditioning 09/30/2014   Primary osteoarthritis of right knee 09/30/2014   History of breast cancer in female 09/14/2014   History of radiation therapy 09/14/2014   Acute embolism and thrombosis of unspecified deep veins of lower extremity, bilateral ([place]) 03/30/2014   Lymphadenopathy, inguinal 08/28/2013   Venous ulcer of ankle, left ([place]) 08/04/2013   GERD (gastroesophageal reflux disease) 06/02/2013   Attention deficit hyperactivity disorder (ADHD), unspecified ADHD  type 03/10/2000   Personal history of other venous thrombosis and embolism 12/12/1999     Physical Exam  Triage Vital Signs: ED Triage Vitals  Enc Vitals Group     BP 03/04/23 0847 91/76     Pulse Rate 03/04/23 0847 67     Resp 03/04/23 0847 20     Temp 03/04/23 0847 98.4 F (36.9 C)     Temp src --      SpO2 03/04/23 0847 97 %     Weight 03/04/23 0848 140 lb (63.5 kg)     Height 03/04/23 0848 5\' 4"  (1.626 m)     Head Circumference --      Peak Flow --      Pain Score 03/04/23 0848 6     Pain Loc --      Pain Edu? --      Excl. in [place]? --     Most recent vital signs: Vitals:   03/04/23 0926 03/04/23 0930  BP: (!) 114/57 137/65  Pulse:    Resp:    Temp:    SpO2:  General: Awake, no distress.  CV:  Good peripheral perfusion.  Resp:  Normal effort.  Abd:  No distention.  Neuro:             Awake, Alert, Oriented x 3  Other:  Chronic venous stasis changes bilaterally, left lower extremity is more erythematous than the right, there is a scab on the anterior shin and there is a shallow ulceration on the posterior part of the left lower extremity no exposed fat or tendons or bone no drainage no odor 2+ DP pulses   ED Results / Procedures / Treatments  Labs (all labs ordered are listed, but only abnormal results are displayed) Labs Reviewed - No data to display   EKG     RADIOLOGY       PROCEDURES:  Critical Care performed: No  Procedures  MEDICATIONS ORDERED IN ED: Medications  cephALEXin (KEFLEX) capsule 500 mg (500 mg Oral Given 03/04/23 0918)     IMPRESSION / MDM / [place] / ED COURSE  I reviewed the triage vital signs and the nursing notes.                              Patient's presentation is most consistent with acute, uncomplicated illness.  Differential diagnosis includes, but is not limited to, venous ulcer, venous stasis dermatitis, cellulitis, low suspicion for osteomyelitis  The patient is a 77 year old female  with multiple medical comorbidities with chronic wounds in the lower extremities who presents because she noticed that the left lower extremity wound was now open.  She has been dealing with a left lower extremity wound for about a year has been seeing wound care pretty regularly.  Last saw them about 2 weeks ago at which time was noted the wound was closed.  Patient is not wearing compression since then.  Yesterday when she was applying ketoconazole to her foot which has been prescribed by dermatology for tinea she noticed that there was a area of open ulceration on the posterior portion of the left lower extremity which is not the site of her prior wound.  She think she needs IV antibiotics.  Patient denies any fevers or other systemic symptoms.  Patient is afebrile here.  She looks clinically well.  She surely has chronic venous stasis changes in both lower extremities the left lower extremity is slightly more erythematous compared to the right.  There is a shallow superficial ulceration that is about 3 x 2 cm on the posterior left lower extremity superior to the heel.  There is no drainage no odor it is nontender.  While she may have a mild cellulitis I think that the erythema is enlarged due to stasis.  I do think she is high risk for infection so I will cover her for nonpurulent cellulitis.  However I do not think clinically she needs IV antibiotics as she is otherwise systemically well.  I did dress the wound with Xeroform Curlex and compression.  Stressed the importance of compression and elevation and preventing this from progressing.  I also encouraged her to follow-up with wound care on Monday so that she can reestablish care to ensure this is not worsening.  Will prescribe 7 days of Keflex.  We discussed return precautions.      FINAL CLINICAL IMPRESSION(S) / ED DIAGNOSES   Final diagnoses:  Ulcer of left lower extremity, limited to breakdown of skin ([place])     Rx / DC  Orders   ED  Discharge Orders          Ordered    cephALEXin (KEFLEX) 500 MG capsule  4 times daily        03/04/23 0914             Note:  This document was prepared using Dragon voice recognition software and may include unintentional dictation errors.   Rada Hay, MD 03/04/23 1539

## 2023-03-09 ENCOUNTER — Other Ambulatory Visit: Payer: Self-pay | Admitting: Dermatology

## 2023-03-12 ENCOUNTER — Other Ambulatory Visit: Payer: Self-pay | Admitting: Hospice and Palliative Medicine

## 2023-03-12 ENCOUNTER — Telehealth: Payer: Self-pay | Admitting: *Deleted

## 2023-03-12 MED ORDER — OXYCODONE HCL 10 MG PO TABS: 15.0000 mg | ORAL_TABLET | Freq: Four times a day (QID) | ORAL | 0 refills | Status: DC | PRN

## 2023-03-12 NOTE — Telephone Encounter (Signed)
     Patient  visit on 03/04/2023  at [facility]  was for treatment  Have you been able to follow up with your primary care physician? patient palliative and doing better via [place] will see pcp if needed  The patient was able to obtain any needed medicine or equipment.  Are there diet recommendations that you are having difficulty following?  Patient expresses understanding of discharge instructions and education provided has no other needs at this time.    [place] [phone] [address] , [place] [address] Email : [email or web address]

## 2023-03-13 ENCOUNTER — Encounter: Payer: Medicare Other | Attending: Physician Assistant | Admitting: Physician Assistant

## 2023-03-13 DIAGNOSIS — L03115 Cellulitis of right lower limb: Secondary | ICD-10-CM | POA: Insufficient documentation

## 2023-03-13 DIAGNOSIS — Z86718 Personal history of other venous thrombosis and embolism: Secondary | ICD-10-CM | POA: Diagnosis not present

## 2023-03-13 DIAGNOSIS — I87311 Chronic venous hypertension (idiopathic) with ulcer of right lower extremity: Secondary | ICD-10-CM | POA: Insufficient documentation

## 2023-03-13 DIAGNOSIS — L97822 Non-pressure chronic ulcer of other part of left lower leg with fat layer exposed: Secondary | ICD-10-CM | POA: Insufficient documentation

## 2023-03-13 DIAGNOSIS — N184 Chronic kidney disease, stage 4 (severe): Secondary | ICD-10-CM | POA: Insufficient documentation

## 2023-03-13 DIAGNOSIS — Z853 Personal history of malignant neoplasm of breast: Secondary | ICD-10-CM | POA: Diagnosis not present

## 2023-03-13 DIAGNOSIS — I129 Hypertensive chronic kidney disease with stage 1 through stage 4 chronic kidney disease, or unspecified chronic kidney disease: Secondary | ICD-10-CM | POA: Insufficient documentation

## 2023-03-13 NOTE — Progress Notes (Addendum)
Laura, Santiago ([id number]) [id number] Page 1 of 11 Visit Report for 03/13/2023 Chief Complaint Document Details Patient Name: Date of Service: Laura Santiago. 03/13/2023 2:45 PM Medical Record Number: [id number] Patient Account Number: [id number] Date of Birth/Sex: Treating RN: Feb 16, 1946 (77 y.o. Laura Santiago Primary Care Provider: Marisue Ivan Other Clinician: Betha Loa Referring Provider: Treating Provider/Extender: Rebecka Apley in Treatment: 91 Information Obtained from: Patient Chief Complaint Left lower extremity wound Right toe wounds Left upper lateral thigh wounds Electronic Signature(s) Signed: 03/13/2023 2:42:21 PM By: Lenda Kelp PA-C Entered By: Lenda Kelp on 03/13/2023 14:42:20 -------------------------------------------------------------------------------- HPI Details Patient Name: Date of Service: LA Holland Falling [name] 03/13/2023 2:45 PM Medical Record Number: [id number] Patient Account Number: [id number] Date of Birth/Sex: Treating RN: 09-14-1946 (77 y.o. Laura Santiago Primary Care Provider: Marisue Ivan Other Clinician: Betha Loa Referring Provider: Treating Provider/Extender: Maxwell Caul Weeks in Treatment: 37 History of Present Illness HPI Description: Admission 7/27 Laura Santiago is a 77 year old female with a past medical history of ADHD, metastatic breast cancer, stage IV chronic kidney disease, history of DVT on Xarelto and chronic venous insufficiency that presents to the clinic for a chronic left lower extremity wound. She recently moved to [facility] 4 days ago. She was being followed by wound care center in [place]. She reports a 10-year history of wounds to her left lower extremity that eventually do heal with debridement and compression therapy. She states that the current wound reopened 4 months ago and she is using  Vaseline and Coban. She denies signs of infection. 8/3; patient presents for 1 week follow-up. She reports no issues or complaints today. She states she had vascular studies done in the last week. She denies signs of infection. She brought her little service dog with her today. 8/17; patient presents for follow-up. She has missed her last clinic appointment. She states she took the wrap off and attempted to rewrap her leg. She is having difficulty with transportation. She has her service dog with her today. Overall she feels well and reports improvement in wound healing. She denies signs of infection. She reports owning an old Velcro wrap compression and has this at her living facility 9/14; patient presents for follow-up. Patient states that over the past 2 to 3 weeks she developed toe wounds to her right foot. She attributes this to tight fitting shoes. She subsequently developed cellulitis in the right leg and has been treated by doxycycline by her oncologist. She reports improvement in symptoms however continues to have some redness and swelling to this leg. T the left lower extremity patient has been having her wraps changed with home health twice weekly. She states that the [facility] is not helping control o the drainage. Other than that she has no issues or complaints today. She denies signs of infection to the left lower extremity. 9/21; patient presents for follow-up. She reports seeing infectious disease for her cellulitis. She reports no further management. She has home health that changes the wraps twice weekly. She has no issues or complaints today. She denies signs of infection. 10/5; patient presents for follow-up. She has no issues or complaints today. She denies signs of infection. She states that the right great toe has not been dressed by home health. 10/12; patient presents for follow-up. She has no issues or complaints today. She reports improvement in her wound healing.  She has been using silver alginate to the right great  toe wound. She denies signs of infection. 10/26; patient presents for follow-up. Home health did not have sorbact so they continued to use Hydrofera Blue under the wrap. She has been using silver alginate to the great toe wound however she did not have a dressing in place today. She currently denies signs of infection. 11/2; patient presents for follow-up. She has been using sorb act under the compression wrap. She reports using silver alginate to the toe wound again she does not have a dressing in place. She currently denies signs of infection. 11/23; patient presents for follow-up. Unfortunately she has missed her last 2 clinic appointments. She was last seen 3 weeks ago. She did her own compression wrap with Kerlix and Coban yesterday after seeing vein and vascular. She has not been dressing her right great toe wound. She currently denies signs of infection. 11/30; patient presents for 1 week follow-up. She states she changed her dressing last week prior to home health and use sorb act with Dakin's and Antonieta IbaHydrofera Ellerson, Keimani M ([id number]) [id number] Page 2 of 11 Blue. Home health has changed the dressing as well and they have been using sorbact. T oday she reports increased redness to her right lower extremity. She has a history of cellulitis to this leg. She has been using silver alginate to the right great toe. Unfortunately she had an episode of diarrhea prior to coming in and had feces all over the right leg and to the wrap of her left leg. 12/7; patient presents for 1 week follow-up. She states that home health did not come out to change the dressing and she took it off yesterday. It is unclear if she is dressing the right toe wound. She denies signs of infection. 12/14; patient presents for 1 week follow-up. She has no issues or complaints today. 12/21; patient presents for follow-up. She has no issues or  complaints today. She denies signs of infection. 12/28/2021; patient presents for follow-up. She was hospitalized for sepsis secondary to right lower extremity cellulitis On 12/23. She states she is currently at a SNF. She states that she was started on doxycycline this morning for her right great toe swelling and redness. She is not sure what dressings have been done to her left lower extremity for the past 3 weeks. She says its been mainly gauze with an Ace wrap. 1/25; patient presents for follow-up. She is still residing in a skilled nursing facility. She reports mild pain to the left lower extremity wound bed. She states she is going to see a podiatrist soon. 2/8; patient presents for follow-up. She has moved back to her residential community from her skilled nursing facility. She has no issues or complaints today. She denies signs of systemic infections. 2/15; patient presents for follow-up. He has no issues or complaints today. She denies systemic signs of infection. 2/22; patient presents for follow-up. She has no issues or complaints today. She denies signs of infection. 3/1; patient presents for follow-up. She states that home health came out the day after she was seen in our clinic and yesterday to do the wrap change. She denies signs of infection. She reports excoriated skin on the ankle. 3/8; patient presents for follow-up. She has no issues or complaints today. She denies signs of infection. 3/15; patient presents for follow-up. Home health has been coming out to change the dressings. She reports more tenderness to the wound site. She denies purulent drainage, increased warmth or erythema to the area. 4/5; patient presents for follow-up.  She has missed her last 2 clinic appointments. I have not seen her in 3 weeks. She was recently hospitalized for altered mental status. She was involuntarily committed. She was evaluated by psychiatry and deemed to have competency. There was no specific  cause of her altered mental status. It was concluded that her physical and mental health were declining due to her chronic medical conditions. Currently home health has been coming out for dressing changes. Patient has also been doing her own dressing changes. She reports more skin breakdown to the periwound and now has a new wound. She denies fever/chills. She reports continued tenderness to the wound site. 4/12; patient with significant venous insufficiency and a large wound on her left lower leg taking up about 80% of the circumference of her lower leg. Cultures of this grew MRSA and Pseudomonas. She had completed a course of ciprofloxacin now is starting doxycycline. She has been using Dakin's wet-to-dry and a Tubigrip. She has home health twice a week and we change it once. 4/19; patient presents for follow-up. She completed her course of doxycycline. She has been using Dakin's wet-to-dry dressing and Tubigrip. Home health changes the dressing twice weekly. Currently she has no issues or complaints. 4/26; patient presents for follow-up. At last clinic visit orders for home health were Iodosorb under compression therapy. Unfortunately they did not have the dressing and have been using Dakin's and gentamicin under the wrap. Patient currently denies signs of infection. She has no issues or complaints today. 5/3; patient presents for follow-up. Again Iodosorb has not been used under the compression therapy when home health comes out to change the wrap and dressing. They have been using Sorbact. It is unclear why this is happening since we send orders weekly to the agency. She denies signs of infection. Patient has not purchased the [place] antibiotics. We reached out to the company and they said they have been trying to contact her on a regular basis. We gave the patient the number to call to order the medication. 5/10; patient presents for follow-up. She has no issues or complaints today. Again  home health has not been using Iodosorb. Mepilex was on the wound bed. No other dressings noted. She brought in her Keystone antibiotics. She denies signs of infection. 5/17; patient presents for follow-up. Home health has come out twice since she was last seen. Joint well she has been using Keystone antibiotic with Sorbact under the compression wrap. She has no issues or complaints today. She denies signs of infection. 5/24; patient presents for follow-up. We have been using Keystone antibiotics with Sorbact under compression therapy. She is tolerating the treatment well. She is reporting improvement in wound healing. She denies signs of infection. 5/31; patient presents for follow-up. We continue to do [facility] antibiotics with Sorbact under compression therapy. She continues to report improvement in wound healing. Home health comes out and changes the dressing once weekly. 05-17-2022 upon evaluation today patient appears to be doing better in regard to her wound especially compared to the last time I saw her. Fortunately I do think that she is seeing improvements. With that being said I do believe that she may be benefit from sharp debridement today to clear away some of the necrotic debris I discussed that with her as well. She is an amendable to that plan. Otherwise she is very pleased with how the [facility]ia is doing for her. 6/14; patient presents for follow-up. We have been using Keystone antibiotic with Sorbact and absorbent dressings under  3 layer compression. She has no issues or complaints today. She reports improvement in wound healing. She denies signs of infection. 6/21; patient presents for follow-up. We are continuing with [facility] antibiotic and Sorbact under 3 layer compression. Patient has no complaints. Continued wound healing is happening. She denies signs of infection. 6/28; patient presents for follow-up. We have been using Keystone antibiotic with Sorbact under 3 layer  compression. Usually home health comes out and changes the dressing twice a week. Unfortunately they did not go out to change the dressing. It is unclear why. Patient did not call them. She currently denies signs of infection. 7/5; patient presents for follow-up. We have been using Keystone antibiotic with calcium alginate under 3 layer compression. She reports improvement in wound healing. She denies signs of infection. Home health has come out to do dressing changes twice this past week. 7/12; patient presents for follow-up. We have been using Keystone antibiotic with calcium alginate under 3 layer compression. Patient states that home health came out once last week to change the dressing. She reports improvement in wound healing. She currently denies signs of infection. 7/19; patient presents for follow-up. We have been using Keystone antibiotic with calcium alginate under 3 layer compression. Home health came out once last week to change the dressing. She has no issues or complaints today. She denies signs of infection. 8/2; patient presents for follow-up. We have been using Keystone antibiotic with calcium alginate under 3 layer compression. Unfortunately she missed her appointment last week and home health did not come out to do dressing changes. Patient currently denies signs of infection. YOHANA, BARTHA ([id number]) [id number] Page 3 of 11 8/9; patient presents for follow-up. We have been using Keystone with calcium alginate under 3 layer compression. She states that home health came out once last week. She currently denies signs of infection. Her wrap was completely wet. She states she was cleaning the top of the leg and water soaked down into the wrap. 8/16; patient presents for follow-up. We have been using Keystone with calcium alginate under 3 layer compression. She states that home health came out twice last week. She has no issues or complaints  today. 8/23; patient presents for follow-up. He has been using Keystone with calcium alginate under 3 layer compression. Home health came out twice last week. She denies signs of infection. 8/30; patient presents for follow-up. We have been using Keystone with calcium alginate under 3 layer compression. Home health came out once last week to change the dressing. Patient reports improvement in wound healing. She states she is almost done with her chemotherapy infusions and has 1 more left. 9/13; patient presents for follow-up. She has lost the capsules to her [facility] antibiotic which I believe is the vancomycin pills. She has her Zosyn powder today. We have been using Keystone antibiotic ointment with calcium alginate under 3 layer compression. She is concerned about systemic infection however her vitals are stable and there is no surrounding soft tissue infection. She would like to remain a patient in our wound care center however would like a second opinion for her wound care at another facility. She asked to be referred to [facility] wound care center. 9/20; patient presents for follow-up. She found her vancomycin capsules and brought in her complete Keystone antibiotic ointment set today. Unfortunately she has developed skin breakdown and Erythema to the right lower extremity With increased swelling. She states she went to a pow wow Over the weekend and was on her feet  for extended periods of time. She saw her oncologist yesterday who prescribed her doxycycline for her right lower extremity erythema. 9/27; patient presents for follow-up. We have been using Keystone antibiotic with Aquacel under 3 layer compression to the lower extremities bilaterally. When home health came and changed the wrap she secretly put coffee into the spray mix along with [facility] antibiotic on her leg thinking the acidic component would better activate the zoysn (sonething she discussed with her microbiologist brother). She has  reported improvement in wound healing. 10/4; patient presents for follow-up. She has no issues or complaints today. We have been doing Aquacel and keystone under 3 layer compression to the lower extremities bilaterally. This morning she took the right lower extremity wrap off as it was uncomfortable. She has no open wounds to this leg. 10/11; patient presents for follow-up. We have been doing Aquacel with Keystone antibiotic ointment under 3 layer compression to the left lower extremity. She developed a small blister to the anterior aspect of the left leg noticed when the wrap was taken off on intake. She currently denies signs of infection. 10/18; patient presents for follow-up. We have been doing Aquacel with Keystone antibiotic ointment under 3 layer compression to the left lower extremity. There has been continued improvement in wound healing. She denies signs of infection. 10/25; patient arrives for treatment of venous insufficiency ulcers on her left lower leg both lateral and medial are remanence of apparently a circumferential wound. Much improved. We are using topicals Keystone and Aquacel Ag under 3 layer compression we continue to make good progress. The patient talk to me at some length with regards to different things she has on her forehead and her Peri orbital area for which she is apparently applying [place]. She feels that what ever we are treating on her wounds is a more systemic problem. I really was not able to get a handle on what she is talking about however I did caution her not to put the [place] in her eyes. 11/1; her wounds continue to improve she is using Keystone and Aquacel Ag G under 3 layer compression. Our intake nurse notes erythema and edema in the right leg. The patient has a litany of concerns with regards to a rash on her forehead or ears and other systemic complaints. She has an appointment with dermatology on November 11 11/8; patient presents for follow-up. We  have been using Keystone and Aquacel under 4-layer compression. She has no issues or complaints today. She reports improvement in wound healing. 11/15; patient presents for follow-up. We have been using Aquacel with Keystone antibiotic under 3 layer compression. Patient continues improvement in wound healing. 12/6; patient presents for follow-up. We have been using Aquacel with Keystone antibiotic ointment under 3 layer compression. Wounds appear well-healing. 12/13; patient presents for follow-up. We have been using Aquacel with Keystone antibiotic under 3 layer compression. She has no issues or complaints today. 12/27 left lateral medial ankle. Superficial wounds remain there is significantly improved we are using Keystone backed with Zetuvit under 4-layer compression 1/3; patient presents for follow-up. Her wounds appear well-healing. We have been using Aquacel Ag with Keystone antibiotic ointment under compression therapy. This should be a 3 layer compression. 1/17; patient presents for follow-up. Her wounds on her left lower extremity are well-healing. We are using Aquacel Ag and Keystone antibiotic ointment under compression therapy. She missed her last clinic appointment. She states that the wrap has been changed twice weekly since she was last seen. Unfortunately  she has developed increased warmth and redness to the right lower extremity consistent with cellulitis. She states this started a few days ago. 1/24; patient presents for follow-up. Her wounds on the left lower extremity are well-healing with Aquacel and Keystone antibiotic ointment under compression therapy. I prescribed Keflex at last clinic visit for cellulitis of the right lower extremity. Unfortunately this has not resolved. She did not follow-up with her PCP for contact our office about her symptoms. 1/31; patient presents for follow-up. We have been using Aquacel and Keystone antibiotic ointment under compression therapy to the  left lower extremity. Wounds appear well-healing. She has been taking clindamycin for the past week. Her symptoms have improved slightly with a decrease in erythema and warmth to the right lower extremity. She says her pain level has improved. However Symptoms have not completely resolved. She denies fever/chills, nausea/vomiting. 2/7; patient presents for follow-up. We have been using Aquacel Ag with Keystone antibiotic ointment under compression therapy to the left lower extremity. For the past week home health has not been using Keystone antibiotic ointment. She continues to take clindamycin. Her symptoms have improved greatly with the decrease in erythema and warmth to the right lower extremity. She denies systemic signs of infection. She has an area of skin breakdown to the right anterior leg. 2/14; Patient presents for follow-up. She has been using Hydrofera Blue to the right anterior leg under Tubigrip. It looks like Hydrofera Blue is also being used with Keystone to the left lower extremity under compression therapy. Order is for Aqualcell Ag. Overall wounds appear well healing. She has no issues or complaints. 2/21; patient presents for follow-up. We have been using Hydrofera Blue under 3 layer compression to the lower extremities bilaterally. Her right lower extremity wounds have healed. She has a small open wound remaining to her left lateral leg. 2/28; patient presents for follow-up. We have been using Hydrofera Blue under 3 layer compression. Home health has changed the dressing to the left lower extremity however started the wrap at the ankle. 3/6; patient presents for follow-up. We have been using PolyMem with antibiotic ointment under 3 layer compression to the left lower extremity. She has no issues or complaints today. ETHELL, BLATCHFORD ([id number]) [id number] Page 4 of 11 3/12; patient presents for follow-up. We have been using Aquacel Ag under 3 layer  compression to the left lower extremity. Her wound is healed. She has juxta lite compression wraps at home. Readmission: 03-13-2023 upon evaluation today patient appears to be doing poorly in regard to her lower extremities. Specifically it is the left lower extremity at this point that is causing her problems she has multiple wounds open. With that being said she does not sound like she has been wearing her compression stockings on a regular basis she has previously seen Dr. Mikey Bussing. With that being said upon evaluation today she tells me that since the wounds have reopened that she has been having a lot of discomfort she also tells me that she went back to using some of the dressings that she had leftover at home she does not know exactly what everything was. Nonetheless she also tells me that she has made an appointment with a dermatologist in town and has to see them tomorrow therefore she is not going to allow [place] to wrap her legs today since "that did not work anyway". With that being said obviously she did improve last time we got her wound healed we order her juxta lite compression wraps again  it does not sound like she has been using those appropriately at home. Electronic Signature(s) Signed: 03/15/2023 8:24:26 PM By: Lenda Kelp PA-C Entered By: Lenda Kelp on 03/15/2023 20:24:26 -------------------------------------------------------------------------------- Physical Exam Details Patient Name: Date of Service: Laura Santiago [name] 03/13/2023 2:45 PM Medical Record Number: [id number] Patient Account Number: [id number] Date of Birth/Sex: Treating RN: 02-14-1946 (77 y.o. Laura Santiago Primary Care Provider: Marisue Ivan Other Clinician: Betha Loa Referring Provider: Treating Provider/Extender: Maxwell Caul Weeks in Treatment: 35 Constitutional Well-nourished and well-hydrated in no acute distress. Respiratory normal breathing without  difficulty. Psychiatric this patient is able to make decisions and demonstrates good insight into disease process. Alert and Oriented x 3. pleasant and cooperative. Notes Upon inspection patient's wounds again are showing signs of being fairly significant at multiple locations. Unfortunately I think that this is a reoccurrence of the same issue that she is going to continue to have if she does not have appropriate compression. She is going to dermatology tomorrow and she seems to think there is something that they are going to do specifically for her to get this better and prevent it from happening explained to her that what she needs to do is take her fluid pills, wear compression, and elevate her legs. Those are the things that he can keep this from reoccurring. Electronic Signature(s) Signed: 03/15/2023 8:24:58 PM By: Lenda Kelp PA-C Entered By: Lenda Kelp on 03/15/2023 20:24:57 -------------------------------------------------------------------------------- Physician Orders Details Patient Name: Date of Service: Laura Santiago [name] 03/13/2023 2:45 PM Medical Record Number: [id number] Patient Account Number: [id number] Date of Birth/Sex: Treating RN: 18-Mar-1946 (76 y.o. Laura Santiago Primary Care Provider: Marisue Ivan Other Clinician: Betha Loa Referring Provider: Treating Provider/Extender: Rebecka Apley in Treatment: 72 Verbal / Phone Orders: Yes Clinician: Angelina Pih Read Back and Verified: Yes Diagnosis Coding ICD-10 Coding Code Description [id number] 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 I87.311 Chronic venous hypertension (idiopathic) with ulcer of right lower extremity I87.2 Venous insufficiency (chronic) (peripheral) Z79.01 Long term (current) use of anticoagulants I10 Essential (primary)  hypertension 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 Cellulitis of right lower limb CHARELL, FAULK ([id number]) [id number] Page 5 of 11 S81.801A Unspecified open wound, right lower leg, initial encounter Follow-up Appointments Return Appointment in 1 week. Nurse Visit as needed Home Health Home Health Company: [facility] [phone] ADMIT to Home Health for wound care. May utilize formulary equivalent dressing for wound treatment orders unless otherwise specified. Home Health Nurse may visit PRN to address patients wound care needs. **Please direct any NON-WOUND related issues/requests for orders to patient's Primary Care Physician. **If current dressing causes regression in wound condition, may D/C ordered dressing product/s and apply Normal Saline Moist Dressing daily until next Wound Healing Center or Other MD appointment. **Notify Wound Healing Center of regression in wound condition at [phone]. Bathing/ Shower/ Hygiene May shower with wound dressing protected with water repellent cover or cast protector. Edema Control - Lymphedema / Segmental Compressive Device / Other Tubigrip single layer applied. - Right lower leg Elevate, Exercise Daily and A void Standing for Long Periods of Time. Elevate legs to the level of the heart and pump ankles as often as possible Elevate leg(s) parallel to the floor when sitting. DO YOUR BEST to sleep in the bed at night. DO NOT sleep in your recliner. Long  hours of sitting in a recliner leads to swelling of the legs and/or potential wounds on your backside. Additional Orders / Instructions Follow Nutritious Diet and Increase Protein Intake Wound Treatment Wound #10 - Lower Leg Wound Laterality: Left, Posterior Cleanser: Vashe 5.8 (oz) 3 x Per Week/30 Days Discharge Instructions: Use vashe 5.8 (oz) as directed Prim Dressing: Aquacel Extra Hydrofiber Dressing, 4x5 (in/in) ary 3 x Per Week/30  Days Secondary Dressing: Zetuvit Plus 4x8 (in/in) 3 x Per Week/30 Days Secured With: Tubigrip Size D, 3x10 (in/yd) 3 x Per Week/30 Days Wound #11 - Lower Leg Wound Laterality: Left, Lateral Cleanser: Vashe 5.8 (oz) 3 x Per Week/30 Days Discharge Instructions: Use vashe 5.8 (oz) as directed Prim Dressing: Aquacel Extra Hydrofiber Dressing, 4x5 (in/in) ary 3 x Per Week/30 Days Secondary Dressing: Zetuvit Plus 4x8 (in/in) 3 x Per Week/30 Days Secured With: Tubigrip Size D, 3x10 (in/yd) 3 x Per Week/30 Days Wound #12 - Ankle Wound Laterality: Left, Medial Cleanser: Byram Ancillary Kit - 15 Day Supply (DME) (Generic) 3 x Per Week/30 Days Discharge Instructions: Use supplies as instructed; Kit contains: (15) Saline Bullets; (15) 3x3 Gauze; 15 pr Gloves Cleanser: Vashe 5.8 (oz) 3 x Per Week/30 Days Discharge Instructions: Use vashe 5.8 (oz) as directed Prim Dressing: Aquacel Extra Hydrofiber Dressing, 4x5 (in/in) (DME) (Dispense As Written) 3 x Per Week/30 Days ary Secondary Dressing: Zetuvit Plus 4x8 (in/in) (DME) (Dispense As Written) 3 x Per Week/30 Days Secured With: Tubigrip Size D, 3x10 (in/yd) 3 x Per Week/30 Days Electronic Signature(s) Signed: 03/13/2023 4:53:44 PM By: Betha Loa Signed: 03/16/2023 8:47:01 AM By: Lenda Kelp PA-C Entered By: Betha Loa on 03/13/2023 16:12:40 -------------------------------------------------------------------------------- Problem List Details Patient Name: Date of Service: Laura Santiago [name] 03/13/2023 2:45 PM Medical Record Number: [id number] Patient Account Number: [id number] Date of Birth/Sex: Treating RN: December 19, 1945 (77 y.o. Laura Santiago [place], [place] ([id number]) [phone].pdf Page 6 of 11 Primary Care Provider: Marisue Ivan Other Clinician: Betha Loa Referring Provider: Treating Provider/Extender: Rebecka Apley in Treatment: 74 Active  Problems ICD-10 Encounter Code Description Active Date MDM Diagnosis 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sed11/23/2022 No Yes 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 11/02/2021 No Yes I87.311 Chronic venous hypertension (idiopathic) with ulcer of right lower extremity 08/30/2022 No Yes I87.2 Venous insufficiency (chronic) (peripheral) 07/06/2021 No Yes Z79.01 Long term (current) use of anticoagulants 07/06/2021 No Yes I10 Essential (primary) hypertension 07/06/2021 No Yes 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 07/06/2021 No Yes L03.115 Cellulitis of right lower limb 12/27/2022 No Yes S81.801A Unspecified open wound, right lower leg, initial encounter 01/17/2023 No Yes Inactive Problems ICD-10 Code Description Active Date Inactive Date S81.802A Unspecified open wound, left lower leg, initial encounter 07/06/2021 07/06/2021 S91.101A Unspecified open wound of right great toe without damage to nail, initial encounter 08/24/2021 08/24/2021 S91.104A Unspecified open wound of right lesser toe(s) without damage to nail, initial encounter 08/24/2021 08/24/2021 Resolved Problems ICD-10 Code Description Active Date Resolved Date S91.104D Unspecified open wound of right lesser toe(s) without damage to nail, subsequent 08/31/2021 08/31/2021 encounter S91.201D Unspecified open wound of right great toe with damage to nail, subsequent encounter 08/31/2021 08/31/2021 Electronic Signature(s) Signed: 03/13/2023 2:42:16 PM By: Genelle Gather, Nezperce M ([id number]) By: Lenda Kelp PA-C [phone].pdf Page 7 of 11 Signed: 03/13/2023 2:42:16 PM Entered By: Lenda Kelp on 03/13/2023 14:42:16 -------------------------------------------------------------------------------- Progress Note Details Patient Name: Date of  Service: Laura Santiago [name] 03/13/2023 2:45 PM Medical Record Number:  [id number] Patient Account Number: [id number] Date of Birth/Sex: Treating RN: 10-03-1946 (77 y.o. Laura Santiago Primary Care Provider: Marisue Ivan Other Clinician: Betha Loa Referring Provider: Treating Provider/Extender: Rebecka Apley in Treatment: 48 Subjective Chief Complaint Information obtained from Patient Left lower extremity wound Right toe wounds Left upper lateral thigh wounds History of Present Illness (HPI) Admission 7/27 Ms. Malyia Moro is a 77 year old female with a past medical history of ADHD, metastatic breast cancer, stage IV chronic kidney disease, history of DVT on Xarelto and chronic venous insufficiency that presents to the clinic for a chronic left lower extremity wound. She recently moved to [facility] 4 days ago. She was being followed by wound care center in [place]. She reports a 10-year history of wounds to her left lower extremity that eventually do heal with debridement and compression therapy. She states that the current wound reopened 4 months ago and she is using Vaseline and Coban. She denies signs of infection. 8/3; patient presents for 1 week follow-up. She reports no issues or complaints today. She states she had vascular studies done in the last week. She denies signs of infection. She brought her little service dog with her today. 8/17; patient presents for follow-up. She has missed her last clinic appointment. She states she took the wrap off and attempted to rewrap her leg. She is having difficulty with transportation. She has her service dog with her today. Overall she feels well and reports improvement in wound healing. She denies signs of infection. She reports owning an old Velcro wrap compression and has this at her living facility 9/14; patient presents for follow-up. Patient states that over the past 2 to 3 weeks she developed toe wounds to her right foot. She attributes this to tight  fitting shoes. She subsequently developed cellulitis in the right leg and has been treated by doxycycline by her oncologist. She reports improvement in symptoms however continues to have some redness and swelling to this leg. T the left lower extremity patient has been having her wraps changed with home health twice weekly. She states that the [facility] is not helping control o the drainage. Other than that she has no issues or complaints today. She denies signs of infection to the left lower extremity. 9/21; patient presents for follow-up. She reports seeing infectious disease for her cellulitis. She reports no further management. She has home health that changes the wraps twice weekly. She has no issues or complaints today. She denies signs of infection. 10/5; patient presents for follow-up. She has no issues or complaints today. She denies signs of infection. She states that the right great toe has not been dressed by home health. 10/12; patient presents for follow-up. She has no issues or complaints today. She reports improvement in her wound healing. She has been using silver alginate to the right great toe wound. She denies signs of infection. 10/26; patient presents for follow-up. Home health did not have sorbact so they continued to use Hydrofera Blue under the wrap. She has been using silver alginate to the great toe wound however she did not have a dressing in place today. She currently denies signs of infection. 11/2; patient presents for follow-up. She has been using sorb act under the compression wrap. She reports using silver alginate to the toe wound again she does not have a dressing in place. She currently denies signs of infection. 11/23; patient  presents for follow-up. Unfortunately she has missed her last 2 clinic appointments. She was last seen 3 weeks ago. She did her own compression wrap with Kerlix and Coban yesterday after seeing vein and vascular. She has not been  dressing her right great toe wound. She currently denies signs of infection. 11/30; patient presents for 1 week follow-up. She states she changed her dressing last week prior to home health and use sorb act with Dakin's and Hydrofera Blue. Home health has changed the dressing as well and they have been using sorbact. T oday she reports increased redness to her right lower extremity. She has a history of cellulitis to this leg. She has been using silver alginate to the right great toe. Unfortunately she had an episode of diarrhea prior to coming in and had feces all over the right leg and to the wrap of her left leg. 12/7; patient presents for 1 week follow-up. She states that home health did not come out to change the dressing and she took it off yesterday. It is unclear if she is dressing the right toe wound. She denies signs of infection. 12/14; patient presents for 1 week follow-up. She has no issues or complaints today. 12/21; patient presents for follow-up. She has no issues or complaints today. She denies signs of infection. 12/28/2021; patient presents for follow-up. She was hospitalized for sepsis secondary to right lower extremity cellulitis On 12/23. She states she is currently at a SNF. She states that she was started on doxycycline this morning for her right great toe swelling and redness. She is not sure what dressings have been done to her left lower extremity for the past 3 weeks. She says its been mainly gauze with an Ace wrap. 1/25; patient presents for follow-up. She is still residing in a skilled nursing facility. She reports mild pain to the left lower extremity wound bed. She states she is going to see a podiatrist soon. 2/8; patient presents for follow-up. She has moved back to her residential community from her skilled nursing facility. She has no issues or complaints today. She denies signs of systemic infections. 2/15; patient presents for follow-up. He has no issues or  complaints today. She denies systemic signs of infection. 2/22; patient presents for follow-up. She has no issues or complaints today. She denies signs of infection. ALYSAH, CARTON ([id number]) [id number] Page 8 of 11 3/1; patient presents for follow-up. She states that home health came out the day after she was seen in our clinic and yesterday to do the wrap change. She denies signs of infection. She reports excoriated skin on the ankle. 3/8; patient presents for follow-up. She has no issues or complaints today. She denies signs of infection. 3/15; patient presents for follow-up. Home health has been coming out to change the dressings. She reports more tenderness to the wound site. She denies purulent drainage, increased warmth or erythema to the area. 4/5; patient presents for follow-up. She has missed her last 2 clinic appointments. I have not seen her in 3 weeks. She was recently hospitalized for altered mental status. She was involuntarily committed. She was evaluated by psychiatry and deemed to have competency. There was no specific cause of her altered mental status. It was concluded that her physical and mental health were declining due to her chronic medical conditions. Currently home health has been coming out for dressing changes. Patient has also been doing her own dressing changes. She reports more skin breakdown to the periwound and now has  a new wound. She denies fever/chills. She reports continued tenderness to the wound site. 4/12; patient with significant venous insufficiency and a large wound on her left lower leg taking up about 80% of the circumference of her lower leg. Cultures of this grew MRSA and Pseudomonas. She had completed a course of ciprofloxacin now is starting doxycycline. She has been using Dakin's wet-to-dry and a Tubigrip. She has home health twice a week and we change it once. 4/19; patient presents for follow-up. She completed her  course of doxycycline. She has been using Dakin's wet-to-dry dressing and Tubigrip. Home health changes the dressing twice weekly. Currently she has no issues or complaints. 4/26; patient presents for follow-up. At last clinic visit orders for home health were Iodosorb under compression therapy. Unfortunately they did not have the dressing and have been using Dakin's and gentamicin under the wrap. Patient currently denies signs of infection. She has no issues or complaints today. 5/3; patient presents for follow-up. Again Iodosorb has not been used under the compression therapy when home health comes out to change the wrap and dressing. They have been using Sorbact. It is unclear why this is happening since we send orders weekly to the agency. She denies signs of infection. Patient has not purchased the [facility] antibiotics. We reached out to the company and they said they have been trying to contact her on a regular basis. We gave the patient the number to call to order the medication. 5/10; patient presents for follow-up. She has no issues or complaints today. Again home health has not been using Iodosorb. Mepilex was on the wound bed. No other dressings noted. She brought in her Keystone antibiotics. She denies signs of infection. 5/17; patient presents for follow-up. Home health has come out twice since she was last seen. Joint well she has been using Keystone antibiotic with Sorbact under the compression wrap. She has no issues or complaints today. She denies signs of infection. 5/24; patient presents for follow-up. We have been using Keystone antibiotics with Sorbact under compression therapy. She is tolerating the treatment well. She is reporting improvement in wound healing. She denies signs of infection. 5/31; patient presents for follow-up. We continue to do [facility] antibiotics with Sorbact under compression therapy. She continues to report improvement in wound healing. Home health comes  out and changes the dressing once weekly. 05-17-2022 upon evaluation today patient appears to be doing better in regard to her wound especially compared to the last time I saw her. Fortunately I do think that she is seeing improvements. With that being said I do believe that she may be benefit from sharp debridement today to clear away some of the necrotic debris I discussed that with her as well. She is an amendable to that plan. Otherwise she is very pleased with how the [facility] is doing for her. 6/14; patient presents for follow-up. We have been using Keystone antibiotic with Sorbact and absorbent dressings under 3 layer compression. She has no issues or complaints today. She reports improvement in wound healing. She denies signs of infection. 6/21; patient presents for follow-up. We are continuing with [facility] antibiotic and Sorbact under 3 layer compression. Patient has no complaints. Continued wound healing is happening. She denies signs of infection. 6/28; patient presents for follow-up. We have been using Keystone antibiotic with Sorbact under 3 layer compression. Usually home health comes out and changes the dressing twice a week. Unfortunately they did not go out to change the dressing. It is unclear why.  Patient did not call them. She currently denies signs of infection. 7/5; patient presents for follow-up. We have been using Keystone antibiotic with calcium alginate under 3 layer compression. She reports improvement in wound healing. She denies signs of infection. Home health has come out to do dressing changes twice this past week. 7/12; patient presents for follow-up. We have been using Keystone antibiotic with calcium alginate under 3 layer compression. Patient states that home health came out once last week to change the dressing. She reports improvement in wound healing. She currently denies signs of infection. 7/19; patient presents for follow-up. We have been using Keystone  antibiotic with calcium alginate under 3 layer compression. Home health came out once last week to change the dressing. She has no issues or complaints today. She denies signs of infection. 8/2; patient presents for follow-up. We have been using Keystone antibiotic with calcium alginate under 3 layer compression. Unfortunately she missed her appointment last week and home health did not come out to do dressing changes. Patient currently denies signs of infection. 8/9; patient presents for follow-up. We have been using Keystone with calcium alginate under 3 layer compression. She states that home health came out once last week. She currently denies signs of infection. Her wrap was completely wet. She states she was cleaning the top of the leg and water soaked down into the wrap. 8/16; patient presents for follow-up. We have been using Keystone with calcium alginate under 3 layer compression. She states that home health came out twice last week. She has no issues or complaints today. 8/23; patient presents for follow-up. He has been using Keystone with calcium alginate under 3 layer compression. Home health came out twice last week. She denies signs of infection. 8/30; patient presents for follow-up. We have been using Keystone with calcium alginate under 3 layer compression. Home health came out once last week to change the dressing. Patient reports improvement in wound healing. She states she is almost done with her chemotherapy infusions and has 1 more left. 9/13; patient presents for follow-up. She has lost the capsules to her [facility] antibiotic which I believe is the vancomycin pills. She has her Zosyn powder today. We have been using Keystone antibiotic ointment with calcium alginate under 3 layer compression. She is concerned about systemic infection however her vitals are stable and there is no surrounding soft tissue infection. She would like to remain a patient in our wound care center  however would like a second opinion for her wound care at another facility. She asked to be referred to [facility] wound care center. 9/20; patient presents for follow-up. She found her vancomycin capsules and brought in her complete Keystone antibiotic ointment set today. Unfortunately she has developed skin breakdown and Erythema to the right lower extremity With increased swelling. She states she went to a pow wow Over the weekend and was on her feet for extended periods of time. She saw her oncologist yesterday who prescribed her doxycycline for her right lower extremity erythema. 9/27; patient presents for follow-up. We have been using Keystone antibiotic with Aquacel under 3 layer compression to the lower extremities bilaterally. When home health came and changed the wrap she secretly put coffee into the spray mix along with [facility] antibiotic on her leg thinking the acidic component would better activate the zoysn (sonething she discussed with her microbiologist brother). She has reported improvement in wound healing. Laura, Santiago ([id number]) [id number] Page 9 of 11 10/4; patient presents for follow-up. She has  no issues or complaints today. We have been doing Aquacel and keystone under 3 layer compression to the lower extremities bilaterally. This morning she took the right lower extremity wrap off as it was uncomfortable. She has no open wounds to this leg. 10/11; patient presents for follow-up. We have been doing Aquacel with Keystone antibiotic ointment under 3 layer compression to the left lower extremity. She developed a small blister to the anterior aspect of the left leg noticed when the wrap was taken off on intake. She currently denies signs of infection. 10/18; patient presents for follow-up. We have been doing Aquacel with Keystone antibiotic ointment under 3 layer compression to the left lower extremity. There has been continued improvement in wound  healing. She denies signs of infection. 10/25; patient arrives for treatment of venous insufficiency ulcers on her left lower leg both lateral and medial are remanence of apparently a circumferential wound. Much improved. We are using topicals Keystone and Aquacel Ag under 3 layer compression we continue to make good progress. The patient talk to me at some length with regards to different things she has on her forehead and her Peri orbital area for which she is apparently applying Thunderbolt. She feels that what ever we are treating on her wounds is a more systemic problem. I really was not able to get a handle on what she is talking about however I did caution her not to put the [place] in her eyes. 11/1; her wounds continue to improve she is using Keystone and Aquacel Ag G under 3 layer compression. Our intake nurse notes erythema and edema in the right leg. The patient has a litany of concerns with regards to a rash on her forehead or ears and other systemic complaints. She has an appointment with dermatology on November 11 11/8; patient presents for follow-up. We have been using Keystone and Aquacel under 4-layer compression. She has no issues or complaints today. She reports improvement in wound healing. 11/15; patient presents for follow-up. We have been using Aquacel with Keystone antibiotic under 3 layer compression. Patient continues improvement in wound healing. 12/6; patient presents for follow-up. We have been using Aquacel with Keystone antibiotic ointment under 3 layer compression. Wounds appear well-healing. 12/13; patient presents for follow-up. We have been using Aquacel with Keystone antibiotic under 3 layer compression. She has no issues or complaints today. 12/27 left lateral medial ankle. Superficial wounds remain there is significantly improved we are using Keystone backed with Zetuvit under 4-layer compression 1/3; patient presents for follow-up. Her wounds appear well-healing.  We have been using Aquacel Ag with Keystone antibiotic ointment under compression therapy. This should be a 3 layer compression. 1/17; patient presents for follow-up. Her wounds on her left lower extremity are well-healing. We are using Aquacel Ag and Keystone antibiotic ointment under compression therapy. She missed her last clinic appointment. She states that the wrap has been changed twice weekly since she was last seen. Unfortunately she has developed increased warmth and redness to the right lower extremity consistent with cellulitis. She states this started a few days ago. 1/24; patient presents for follow-up. Her wounds on the left lower extremity are well-healing with Aquacel and Keystone antibiotic ointment under compression therapy. I prescribed Keflex at last clinic visit for cellulitis of the right lower extremity. Unfortunately this has not resolved. She did not follow-up with her PCP for contact our office about her symptoms. 1/31; patient presents for follow-up. We have been using Aquacel and Keystone antibiotic ointment under compression  therapy to the left lower extremity. Wounds appear well-healing. She has been taking clindamycin for the past week. Her symptoms have improved slightly with a decrease in erythema and warmth to the right lower extremity. She says her pain level has improved. However Symptoms have not completely resolved. She denies fever/chills, nausea/vomiting. 2/7; patient presents for follow-up. We have been using Aquacel Ag with Keystone antibiotic ointment under compression therapy to the left lower extremity. For the past week home health has not been using Keystone antibiotic ointment. She continues to take clindamycin. Her symptoms have improved greatly with the decrease in erythema and warmth to the right lower extremity. She denies systemic signs of infection. She has an area of skin breakdown to the right anterior leg. 2/14; Patient presents for follow-up.  She has been using Hydrofera Blue to the right anterior leg under Tubigrip. It looks like Hydrofera Blue is also being used with Keystone to the left lower extremity under compression therapy. Order is for Aqualcell Ag. Overall wounds appear well healing. She has no issues or complaints. 2/21; patient presents for follow-up. We have been using Hydrofera Blue under 3 layer compression to the lower extremities bilaterally. Her right lower extremity wounds have healed. She has a small open wound remaining to her left lateral leg. 2/28; patient presents for follow-up. We have been using Hydrofera Blue under 3 layer compression. Home health has changed the dressing to the left lower extremity however started the wrap at the ankle. 3/6; patient presents for follow-up. We have been using PolyMem with antibiotic ointment under 3 layer compression to the left lower extremity. She has no issues or complaints today. 3/12; patient presents for follow-up. We have been using Aquacel Ag under 3 layer compression to the left lower extremity. Her wound is healed. She has juxta lite compression wraps at home. Readmission: 03-13-2023 upon evaluation today patient appears to be doing poorly in regard to her lower extremities. Specifically it is the left lower extremity at this point that is causing her problems she has multiple wounds open. With that being said she does not sound like she has been wearing her compression stockings on a regular basis she has previously seen Dr. Mikey Bussing. With that being said upon evaluation today she tells me that since the wounds have reopened that she has been having a lot of discomfort she also tells me that she went back to using some of the dressings that she had leftover at home she does not know exactly what everything was. Nonetheless she also tells me that she has made an appointment with a dermatologist in town and has to see them tomorrow therefore she is not going to allow [place]  to wrap her legs today since "that did not work anyway". With that being said obviously she did improve last time we got her wound healed we order her juxta lite compression wraps again it does not sound like she has been using those appropriately at home. Objective Constitutional CHARQUITA, AHMANN ([id number]) [id number] Page 10 of 11 Well-nourished and well-hydrated in no acute distress. Vitals Time Taken: 3:25 PM, Height: 66 in, Weight: 153 lbs, BMI: 24.7, Temperature: 97.8 F, Pulse: 78 bpm, Respiratory Rate: 18 breaths/min, Blood Pressure: 163/54 mmHg. Respiratory normal breathing without difficulty. Psychiatric this patient is able to make decisions and demonstrates good insight into disease process. Alert and Oriented x 3. pleasant and cooperative. General Notes: Upon inspection patient's wounds again are showing signs of being fairly significant at multiple locations. Unfortunately  I think that this is a reoccurrence of the same issue that she is going to continue to have if she does not have appropriate compression. She is going to dermatology tomorrow and she seems to think there is something that they are going to do specifically for her to get this better and prevent it from happening explained to her that what she needs to do is take her fluid pills, wear compression, and elevate her legs. Those are the things that he can keep this from reoccurring. Integumentary (Hair, Skin) Wound #10 status is Open. Original cause of wound was Gradually Appeared. The date acquired was: 03/04/2023. The wound is located on the Left,Posterior Lower Leg. The wound measures 8cm length x 9.5cm width x 0.1cm depth; 59.69cm^2 area and 5.969cm^3 volume. There is Fat Layer (Subcutaneous Tissue) exposed. There is no tunneling or undermining noted. There is a medium amount of serosanguineous drainage noted. There is small (1-33%) granulation within the wound bed. There is a large  (67-100%) amount of necrotic tissue within the wound bed including Adherent Slough. Wound #11 status is Open. Original cause of wound was Gradually Appeared. The date acquired was: 03/04/2023. The wound is located on the Left,Lateral Lower Leg. The wound measures 1.7cm length x 1cm width x 0.1cm depth; 1.335cm^2 area and 0.134cm^3 volume. There is Fat Layer (Subcutaneous Tissue) exposed. There is no tunneling or undermining noted. There is a medium amount of serosanguineous drainage noted. There is small (1-33%) granulation within the wound bed. There is a large (67-100%) amount of necrotic tissue within the wound bed including Adherent Slough. Wound #12 status is Open. Original cause of wound was Gradually Appeared. The date acquired was: 03/04/2023. The wound is located on the Left,Medial Ankle. The wound measures 2.4cm length x 1.6cm width x 0.1cm depth; 3.016cm^2 area and 0.302cm^3 volume. There is Fat Layer (Subcutaneous Tissue) exposed. There is no tunneling or undermining noted. There is a medium amount of serosanguineous drainage noted. There is small (1-33%) pink granulation within the wound bed. There is a large (67-100%) amount of necrotic tissue within the wound bed including Adherent Slough. 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 Nurse Visit as needed Home Health: Home Health Company: [facility] [phone] ADMIT to Home Health for wound care. May  utilize formulary equivalent dressing for wound treatment orders unless otherwise specified. Home Health Nurse may visit PRN to address patientoos wound care needs. **Please direct any NON-WOUND related issues/requests for orders to patient's Primary Care Physician. **If current dressing causes regression in wound condition, may D/C ordered dressing product/s and apply Normal Saline Moist Dressing daily until next Wound Healing Center or Other MD appointment. **Notify Wound Healing Center of regression in wound condition at [phone]. Bathing/ Shower/ Hygiene: May shower with wound dressing protected with water repellent cover or cast protector. Edema Control - Lymphedema / Segmental Compressive Device / Other: Tubigrip single layer applied. - Right lower leg Elevate, Exercise Daily and Avoid Standing for Long Periods of Time. Elevate legs to the level of the heart and pump ankles as often as possible Elevate leg(s) parallel  to the floor when sitting. DO YOUR BEST to sleep in the bed at night. DO NOT sleep in your recliner. Long hours of sitting in a recliner leads to swelling of the legs and/or potential wounds on your backside. Additional Orders / Instructions: Follow Nutritious Diet and Increase Protein Intake WOUND #10: - Lower Leg Wound Laterality: Left, Posterior Cleanser: Vashe 5.8 (oz) 3 x Per Week/30 Days Discharge Instructions: Use vashe 5.8 (oz) as directed Prim Dressing: Aquacel Extra Hydrofiber Dressing, 4x5 (in/in) 3 x Per Week/30 Days ary Secondary Dressing: Zetuvit Plus 4x8 (in/in) 3 x Per Week/30 Days Secured With: Tubigrip Size D, 3x10 (in/yd) 3 x Per Week/30 Days WOUND #11: - Lower Leg Wound Laterality: Left, Lateral Cleanser: Vashe 5.8 (oz) 3 x Per Week/30 Days Discharge Instructions: Use vashe 5.8 (oz) as directed Prim Dressing: Aquacel Extra Hydrofiber Dressing, 4x5 (in/in) 3 x Per Week/30 Days Laura, Santiago ([id number])  [id number] Page 11 of 11 Secondary Dressing: Zetuvit Plus 4x8 (in/in) 3 x Per Week/30 Days Secured With: Tubigrip Size D, 3x10 (in/yd) 3 x Per Week/30 Days WOUND #12: - Ankle Wound Laterality: Left, Medial Cleanser: [place]ram Ancillary Kit - 15 Day Supply (DME) (Generic) 3 x Per Week/30 Days Discharge Instructions: Use supplies as instructed; Kit contains: (15) Saline Bullets; (15) 3x3 Gauze; 15 pr Gloves Cleanser: Vashe 5.8 (oz) 3 x Per Week/30 Days Discharge Instructions: Use vashe 5.8 (oz) as directed Prim Dressing: Aquacel Extra Hydrofiber Dressing, 4x5 (in/in) (DME) (Dispense As Written) 3 x Per Week/30 Days ary Secondary Dressing: Zetuvit Plus 4x8 (in/in) (DME) (Dispense As Written) 3 x Per Week/30 Days Secured With: Tubigrip Size D, 3x10 (in/yd) 3 x Per Week/30 Days 1. I am going to recommend at this point that since she will not allow me to wrap her we go ahead and use Tubigrip currently. I am and I suggest that we also have her use the Aquacel which in the past did well for her. Will continue Zetuvit to cover. 2. Starting next week we go back to wrapping her if she will allow it but again I was not notified that out of the days that she is seeing dermatology tomorrow and wanted to be able to allow them to look at "everything". Again her contention is that what we did previously did not work and therefore she wants something done different. We will see patient back for reevaluation in 1 week here in the clinic. If anything worsens or changes patient will contact our office for additional recommendations. Electronic Signature(s) Signed: 03/15/2023 8:25:37 PM By: Lenda Kelp PA-C Entered By: Lenda Kelp on 03/15/2023 20:25:37 -------------------------------------------------------------------------------- SuperBill Details Patient Name: Date of Service: [place] Holland Falling [name] 03/13/2023 Medical Record Number: [id number] Patient Account Number:  [id number] Date of Birth/Sex: Treating RN: 1946-03-17 (77 y.o. Laura Santiago Primary Care Provider: Marisue Ivan Other Clinician: Betha Loa Referring Provider: Treating Provider/Extender: Maxwell Caul Weeks in Treatment: 84 Diagnosis Coding ICD-10 Codes Code Description [phone] 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 I87.311 Chronic venous hypertension (idiopathic) with ulcer of right lower extremity I87.2 Venous insufficiency (chronic) (peripheral) Z79.01 Long term (current) use of anticoagulants I10 Essential (primary) hypertension 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 Cellulitis of right lower limb S81.801A Unspecified open wound, right lower leg, initial encounter Facility Procedures : CPT4 Code: [id number] Description: 99214 - WOUND CARE VISIT-LEV 4 EST PT  Modifier: Quantity: 1 Physician Procedures : CPT4 Code Description Modifier [id number] 99214 - WC PHYS LEVEL 4 - EST PT ICD-10 Diagnosis Description 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.312 Chronic venous hypertension (idiopathic) with ulcer  of left lower extremity I87.311 Chronic venous hypertension (idiopathic) with ulcer of right lower extremity I87.2 Venous insufficiency (chronic) (peripheral) Quantity: 1 Electronic Signature(s) Signed: 03/15/2023 8:26:06 PM By: Lenda Kelp PA-C Previous Signature: 03/13/2023 4:53:44 PM Version By: Betha Loa Entered By: Lenda Kelp on 03/15/2023 20:26:05

## 2023-03-13 NOTE — Progress Notes (Signed)
ZIYAH, MANGLICMOT ([id number]) [id number] Page 1 of 8 Visit Report for 03/13/2023 Arrival Information Details Patient Name: Date of Service: Laura Ace. 03/13/2023 2:45 PM Medical Record Number: [id number] Patient Account Number: [id number] Date of Birth/Sex: Treating RN: 03/08/1946 (77 y.o. Laura Santiago Primary Care Criag Wicklund: Dion Body Other Clinician: [name] Referring Eilyn Polack: Treating Kawthar Ennen/Extender: Rae Roam in Treatment: 37 Visit Information History Since Last Visit All ordered tests and consults were completed: No Patient Arrived: Laura Santiago Added or deleted any medications: Yes Arrival Time: 15:25 Any new allergies or adverse reactions: No Transfer Assistance: None Had a fall or experienced change in No Patient Identification Verified: Yes activities of daily living that may affect Secondary Verification Process Completed: Yes risk of falls: Patient Requires Transmission-Based No Signs or symptoms of abuse/neglect since last visito No Precautions: Hospitalized since last visit: No Patient Has Alerts: Yes Implantable device outside of the clinic excluding No Patient Alerts: PT HAS SERVICE cellular tissue based products placed in the center ANIMAL since last visit: ABI 07/11/21 Has Dressing in Place as Prescribed: Yes R) 1.16 L) 1.27 Pain Present Now: No Electronic Signature(s) Signed: 03/13/2023 4:53:44 PM By: [name] Entered By: [name] on 03/13/2023 15:29:01 -------------------------------------------------------------------------------- Clinic Level of Care Assessment Details Patient Name: Date of Service: Laura Ace. 03/13/2023 2:45 PM Medical Record Number: [id number] Patient Account Number: [id number] Date of Birth/Sex: Treating RN: Jan 22, 1946 (77 y.o. Laura Santiago Primary Care Cynthya Yam: Dion Body Other Clinician: [name] Referring  Starlyn Droge: Treating Clovia Reine/Extender: Rae Roam in Treatment: 2 Clinic Level of Care Assessment Items TOOL 4 Quantity Score []  - 0 Use when only an EandM is performed on FOLLOW-UP visit ASSESSMENTS - Nursing Assessment / Reassessment X- 1 10 Reassessment of Co-morbidities (includes updates in patient status) X- 1 5 Reassessment of Adherence to Treatment Plan ASSESSMENTS - Wound and Skin A ssessment / Reassessment []  - 0 Simple Wound Assessment / Reassessment - one wound X- 3 5 Complex Wound Assessment / Reassessment - multiple wounds []  - 0 Dermatologic / Skin Assessment (not related to wound area) ASSESSMENTS - Focused Assessment []  - 0 Circumferential Edema Measurements - multi extremities []  - 0 Nutritional Assessment / Counseling / Intervention []  - 0 Lower Extremity Assessment (monofilament, tuning fork, pulses) []  - 0 Peripheral Arterial Disease Assessment (using hand held doppler) ASSESSMENTS - Ostomy and/or Continence Assessment and Care []  - 0 Incontinence Assessment and Management []  - 0 Ostomy Care Assessment and Management (repouching, etc.) PROCESS - Coordination of Care Laura Santiago, Laura Santiago ([id number]) [id number] Page 2 of 8 X- 1 15 Simple Patient / Family Education for ongoing care []  - 0 Complex (extensive) Patient / Family Education for ongoing care []  - 0 Staff obtains Programmer, systems, Records, T Results / Process Orders est []  - 0 Staff telephones HHA, Nursing Homes / Clarify orders / etc []  - 0 Routine Transfer to another Facility (non-emergent condition) []  - 0 Routine Hospital Admission (non-emergent condition) []  - 0 New Admissions / Biomedical engineer / Ordering NPWT Apligraf, etc. , []  - 0 Emergency Hospital Admission (emergent condition) X- 1 10 Simple Discharge Coordination []  - 0 Complex (extensive) Discharge Coordination PROCESS - Special Needs []  - 0 Pediatric / Minor Patient  Management []  - 0 Isolation Patient Management []  - 0 Hearing / Language / Visual special needs []  - 0 Assessment of Community assistance (transportation, D/C planning, etc.) []  - 0 Additional assistance / Altered mentation []  -  0 Support Surface(s) Assessment (bed, cushion, seat, etc.) INTERVENTIONS - Wound Cleansing / Measurement []  - 0 Simple Wound Cleansing - one wound X- 3 5 Complex Wound Cleansing - multiple wounds X- 1 5 Wound Imaging (photographs - any number of wounds) []  - 0 Wound Tracing (instead of photographs) []  - 0 Simple Wound Measurement - one wound X- 3 5 Complex Wound Measurement - multiple wounds INTERVENTIONS - Wound Dressings X - Small Wound Dressing one or multiple wounds 3 10 []  - 0 Medium Wound Dressing one or multiple wounds []  - 0 Large Wound Dressing one or multiple wounds []  - 0 Application of Medications - topical []  - 0 Application of Medications - injection INTERVENTIONS - Miscellaneous []  - 0 External ear exam []  - 0 Specimen Collection (cultures, biopsies, blood, body fluids, etc.) []  - 0 Specimen(s) / Culture(s) sent or taken to Lab for analysis []  - 0 Patient Transfer (multiple staff / Civil Service fast streamer / Similar devices) []  - 0 Simple Staple / Suture removal (25 or less) []  - 0 Complex Staple / Suture removal (26 or more) []  - 0 Hypo / Hyperglycemic Management (Santiago monitor of Blood Glucose) []  - 0 Ankle / Brachial Index (ABI) - do not check if billed separately X- 1 5 Vital Signs Has the patient been seen at the hospital within the last three years: Yes Total Score: 125 Level Of Care: New/Established - Level 4 Electronic Signature(s) Signed: 03/13/2023 4:53:44 PM By: Laura Santiago ([id number]) [id number] Page 3 of 8 Entered By: [name] on 03/13/2023 16:13:23 -------------------------------------------------------------------------------- Encounter Discharge Information  Details Patient Name: Date of Service: Laura Ace. 03/13/2023 2:45 PM Medical Record Number: [id number] Patient Account Number: [id number] Date of Birth/Sex: Treating RN: 1946-10-25 (77 y.o. Laura Santiago Primary Care Manha Amato: Dion Body Other Clinician: [name] Referring Nolan Tuazon: Treating Iam Lipson/Extender: Rae Roam in Treatment: 35 Encounter Discharge Information Items Discharge Condition: Stable Ambulatory Status: Walker Discharge Destination: Home Transportation: Other Accompanied By: self Schedule Follow-up Appointment: Yes Clinical Summary of Care: Electronic Signature(s) Signed: 03/13/2023 4:53:44 PM By: [name] Entered By: [name] on 03/13/2023 16:30:24 -------------------------------------------------------------------------------- Lower Extremity Assessment Details Patient Name: Date of Service: Laura Ace. 03/13/2023 2:45 PM Medical Record Number: [id number] Patient Account Number: [id number] Date of Birth/Sex: Treating RN: August 29, 1946 (77 y.o. Laura Santiago Primary Care Shadow Schedler: Dion Body Other Clinician: [name] Referring Oscar Hank: Treating Amor Hyle/Extender: Fraser Din Weeks in Treatment: 87 Edema Assessment Assessed: [Left: Yes] [Right: No] Edema: [Left: Ye] [Right: s] Calf Left: Right: Point of Measurement: 35 cm From Medial Instep 38.8 cm Ankle Left: Right: Point of Measurement: 12 cm From Medial Instep 22.5 cm Vascular Assessment Pulses: Dorsalis Pedis Palpable: [Left:Yes] Electronic Signature(s) Signed: 03/13/2023 4:27:12 PM By: Laura Santiago Signed: 03/13/2023 4:53:44 PM By: [name] Entered By: [name] on 03/13/2023 15:46:01 -------------------------------------------------------------------------------- Multi Wound Chart Details Patient Name: Date of Service: Laura Jewett [name] 03/13/2023 2:45 PM Medical Record  Number: [id number] Patient Account Number: [id number] Date of Birth/Sex: Treating RN: [birth year] (77 y.o. Laura Santiago Primary Care Darrel Baroni: Dion Body Other Clinician: Kalpana, Furmanski ([id number]) [id number] Page 4 of 8 Referring Dabria Wadas: Treating Jaziyah Gradel/Extender: Rae Roam in Treatment: 87 Vital Signs Height(in): 66 Pulse(bpm): 78 Weight(lbs): 153 Blood Pressure(mmHg): 163/54 Body Mass Index(BMI): 24.7 Temperature(F): 97.8 Respiratory Rate(breaths/min): 18 [10:Photos:] Left, Posterior Lower Leg Left, Lateral Lower Leg Left, Medial Ankle Wound Location: Gradually Appeared  Gradually Appeared Gradually Appeared Wounding Event: Venous Leg Ulcer Venous Leg Ulcer Venous Leg Ulcer Primary Etiology: Hypertension, Osteoarthritis, ReceivedHypertension, Osteoarthritis, ReceivedHypertension, Osteoarthritis, Received Comorbid History: Chemotherapy, Received Radiation Chemotherapy, Received Radiation Chemotherapy, Received Radiation 03/04/2023 03/04/2023 03/04/2023 Date Acquired: 0 0 0 Weeks of Treatment: Open Open Open Wound Status: No No No Wound Recurrence: Yes No Yes Clustered Wound: 8x9.5x0.1 1.7x1x0.1 2.4x1.6x0.1 Measurements L x W x D (cm) 59.69 1.335 3.016 A (cm) : rea 5.969 0.134 0.302 Volume (cm) : Full Thickness Without Exposed Full Thickness Without Exposed Full Thickness Without Exposed Classification: Support Structures Support Structures Support Structures Medium Medium Medium Exudate Amount: Serosanguineous Serosanguineous Serosanguineous Exudate Type: red, brown red, brown red, brown Exudate Color: Small (1-33%) Small (1-33%) Small (1-33%) Granulation Amount: N/A N/A Pink Granulation Quality: Large (67-100%) Large (67-100%) Large (67-100%) Necrotic Amount: Fat Layer (Subcutaneous Tissue): Yes Fat Layer (Subcutaneous Tissue): Yes Fat Layer (Subcutaneous Tissue):  Yes Exposed Structures: Fascia: No Fascia: No Fascia: No Tendon: No Tendon: No Tendon: No Muscle: No Muscle: No Muscle: No Joint: No Joint: No Joint: No Bone: No Bone: No Bone: No N/A None None Epithelialization: Treatment Notes Electronic Signature(s) Signed: 03/13/2023 4:53:44 PM By: [name] Entered By: [name] on 03/13/2023 15:46:06 -------------------------------------------------------------------------------- Multi-Disciplinary Care Plan Details Patient Name: Date of Service: Laura Jewett [name] 03/13/2023 2:45 PM Medical Record Number: [id number] Patient Account Number: [id number] Date of Birth/Sex: Treating RN: [birth year] (77 y.o. Laura Santiago Primary Care Abiola Behring: Dion Body Other Clinician: [name] Referring Alexander Aument: Treating Cherilyn Sautter/Extender: Fraser Din Weeks in Treatment: 29 Active Inactive Electronic Signature(s) Signed: 03/13/2023 4:27:12 PM By: Laura Santiago Signed: 03/13/2023 4:53:44 PM By: Laura Santiago ([id number]) [id number] Page 5 of 8 Entered By: [name] on 03/13/2023 16:13:37 -------------------------------------------------------------------------------- Pain Assessment Details Patient Name: Date of Service: Laura Ace. 03/13/2023 2:45 PM Medical Record Number: [id number] Patient Account Number: [id number] Date of Birth/Sex: Treating RN: Mar 06, 1946 (77 y.o. Laura Santiago Primary Care Gricel Copen: Dion Body Other Clinician: [name] Referring Katelin Kutsch: Treating Doneen Ollinger/Extender: Fraser Din Weeks in Treatment: 4 Active Problems Location of Pain Severity and Description of Pain Patient Has Paino No Site Locations Pain Management and Medication Current Pain Management: Electronic Signature(s) Signed: 03/13/2023 4:27:12 PM By: Laura Santiago Signed: 03/13/2023 4:53:44 PM By: [name] Entered By: [name] on 03/13/2023 15:29:43 -------------------------------------------------------------------------------- Patient/Caregiver Education Details Patient Name: Date of Service: Laura Ace 4/2/2024andnbsp2:45 PM Medical Record Number: [id number] Patient Account Number: [id number] Date of Birth/Gender: Treating RN: 1946/02/02 (77 y.o. Laura Santiago Primary Care Physician: Dion Body Other Clinician: [name] Referring Physician: Treating Physician/Extender: Rae Roam in Treatment: 50 Education Assessment Education Provided To: Patient Education Topics Provided Venous: Handouts: Controlling Swelling with Compression Stockings, Other: discussed importance of elevating feet and wrapping legs for compression Methods: Explain/Verbal Responses: State content correctly Electronic Signature(s) Signed: 03/13/2023 4:53:44 PM By: Laura Santiago ([id number]) [id number] Page 6 of 8 Entered By: [name] on 03/13/2023 16:29:15 -------------------------------------------------------------------------------- Wound Assessment Details Patient Name: Date of Service: Laura Ace. 03/13/2023 2:45 PM Medical Record Number: [id number] Patient Account Number: [id number] Date of Birth/Sex: Treating RN: [birth year] (77 y.o. Laura Santiago Primary Care Joeangel Jeanpaul: Dion Body Other Clinician: [name] Referring Jakyrah Holladay: Treating Hadlie Gipson/Extender: Fraser Din Weeks in Treatment: 32 Wound Status Wound Number: 10 Primary Venous Leg Ulcer Etiology: Wound Location: Left, Posterior Lower Leg Wound Status: Open Wounding Event:  Gradually Appeared Comorbid Hypertension, Osteoarthritis, Received Chemotherapy, Date Acquired: 03/04/2023 History: Received Radiation Weeks Of Treatment: 0 Clustered Wound: Yes Photos Wound Measurements Length:  (cm) 8 Width: (cm) 9.5 Depth: (cm) 0.1 Area: (cm) 59.69 Volume: (cm) 5.969 % Reduction in Area: % Reduction in Volume: Tunneling: No Undermining: No Wound Description Classification: Full Thickness Without Exposed Suppor Exudate Amount: Medium Exudate Type: Serosanguineous Exudate Color: red, brown t Structures Foul Odor After Cleansing: No Slough/Fibrino Yes Wound Bed Granulation Amount: Small (1-33%) Exposed Structure Necrotic Amount: Large (67-100%) Fascia Exposed: No Necrotic Quality: Adherent Slough Fat Layer (Subcutaneous Tissue) Exposed: Yes Tendon Exposed: No Muscle Exposed: No Joint Exposed: No Bone Exposed: No Electronic Signature(s) Signed: 03/13/2023 4:27:12 PM By: Laura Santiago Signed: 03/13/2023 4:53:44 PM By: [name] Entered By: [name] on 03/13/2023 15:41:23 -------------------------------------------------------------------------------- Wound Assessment Details Patient Name: Date of Service: Laura Jewett [name] 03/13/2023 2:45 PM Medical Record Number: [id number] Patient Account Number: [id number] Date of Birth/Sex: Treating RN: Oct 15, 1946 (77 y.o. Laura Santiago Primary Care Serenitee Fuertes: Dion Body Other Clinician: [name] Referring Terrian Sentell: Treating Eulas Schweitzer/Extender: Fraser Din Laura Santiago ([id number]) [phone].pdf Page 7 of 8 Weeks in Treatment: 87 Wound Status Wound Number: 11 Primary Venous Leg Ulcer Etiology: Wound Location: Left, Lateral Lower Leg Wound Status: Open Wounding Event: Gradually Appeared Comorbid Hypertension, Osteoarthritis, Received Chemotherapy, Date Acquired: 03/04/2023 History: Received Radiation Weeks Of Treatment: 0 Clustered Wound: No Photos Wound Measurements Length: (cm) 1.7 Width: (cm) 1 Depth: (cm) 0.1 Area: (cm) 1.335 Volume: (cm) 0.134 % Reduction in Area: % Reduction in Volume: Epithelialization: None Tunneling:  No Undermining: No Wound Description Classification: Full Thickness Without Exposed Suppor Exudate Amount: Medium Exudate Type: Serosanguineous Exudate Color: red, brown t Structures Foul Odor After Cleansing: No Slough/Fibrino Yes Wound Bed Granulation Amount: Small (1-33%) Exposed Structure Necrotic Amount: Large (67-100%) Fascia Exposed: No Necrotic Quality: Adherent Slough Fat Layer (Subcutaneous Tissue) Exposed: Yes Tendon Exposed: No Muscle Exposed: No Joint Exposed: No Bone Exposed: No Electronic Signature(s) Signed: 03/13/2023 4:27:12 PM By: Laura Santiago Signed: 03/13/2023 4:53:44 PM By: [name] Entered By: [name] on 03/13/2023 15:43:32 -------------------------------------------------------------------------------- Wound Assessment Details Patient Name: Date of Service: Laura Jewett [name] 03/13/2023 2:45 PM Medical Record Number: [id number] Patient Account Number: [id number] Date of Birth/Sex: Treating RN: 1946/04/03 (77 y.o. Laura Santiago Primary Care Navy Belay: Dion Body Other Clinician: [name] Referring Laryn Venning: Treating Corine Solorio/Extender: Fraser Din Weeks in Treatment: 63 Wound Status Wound Number: 12 Primary Venous Leg Ulcer Etiology: Wound Location: Left, Medial Ankle Wound Status: Open Wounding Event: Gradually Appeared Comorbid Hypertension, Osteoarthritis, Received Chemotherapy, Date Acquired: 03/04/2023 History: Received Radiation Weeks Of Treatment: 0 Clustered Wound: Yes Photos Laura Santiago, Laura Santiago ([id number]) [id number] Page 8 of 8 Wound Measurements Length: (cm) 2.4 Width: (cm) 1.6 Depth: (cm) 0.1 Area: (cm) 3.016 Volume: (cm) 0.302 % Reduction in Area: % Reduction in Volume: Epithelialization: None Tunneling: No Undermining: No Wound Description Classification: Full Thickness Without Exposed Support Structures Exudate Amount: Medium Exudate Type:  Serosanguineous Exudate Color: red, brown Foul Odor After Cleansing: No Slough/Fibrino Yes Wound Bed Granulation Amount: Small (1-33%) Exposed Structure Granulation Quality: Pink Fascia Exposed: No Necrotic Amount: Large (67-100%) Fat Layer (Subcutaneous Tissue) Exposed: Yes Necrotic Quality: Adherent Slough Tendon Exposed: No Muscle Exposed: No Joint Exposed: No Bone Exposed: No Electronic Signature(s) Signed: 03/13/2023 4:27:12 PM By: Laura Santiago Signed: 03/13/2023 4:53:44 PM By: [name] Entered By: [name] on 03/13/2023 15:45:29 -------------------------------------------------------------------------------- Vitals Details Patient Name: Date of  Service: Laura Jewett [name] 03/13/2023 2:45 PM Medical Record Number: [id number] Patient Account Number: [id number] Date of Birth/Sex: Treating RN: [birth year] (77 y.o. Laura Santiago Primary Care Kesha Hurrell: Dion Body Other Clinician: [name] Referring Alek Poncedeleon: Treating Mailey Landstrom/Extender: Fraser Din Weeks in Treatment: 64 Vital Signs Time Taken: 15:25 Temperature (F): 97.8 Height (in): 66 Pulse (bpm): 78 Weight (lbs): 153 Respiratory Rate (breaths/min): 18 Body Mass Index (BMI): 24.7 Blood Pressure (mmHg): 163/54 Reference Range: 80 - 120 mg / dl Electronic Signature(s) Signed: 03/13/2023 4:53:44 PM By: [name] Entered By: [name] on 03/13/2023 15:29:35

## 2023-03-14 ENCOUNTER — Encounter: Payer: Self-pay | Admitting: Dermatology

## 2023-03-14 ENCOUNTER — Ambulatory Visit (INDEPENDENT_AMBULATORY_CARE_PROVIDER_SITE_OTHER): Payer: Medicare Other | Admitting: Dermatology

## 2023-03-14 DIAGNOSIS — L821 Other seborrheic keratosis: Secondary | ICD-10-CM

## 2023-03-14 DIAGNOSIS — L219 Seborrheic dermatitis, unspecified: Secondary | ICD-10-CM | POA: Diagnosis not present

## 2023-03-14 DIAGNOSIS — L97819 Non-pressure chronic ulcer of other part of right lower leg with unspecified severity: Secondary | ICD-10-CM

## 2023-03-14 DIAGNOSIS — M793 Panniculitis, unspecified: Secondary | ICD-10-CM

## 2023-03-14 DIAGNOSIS — I83218 Varicose veins of right lower extremity with both ulcer of other part of lower extremity and inflammation: Secondary | ICD-10-CM | POA: Diagnosis not present

## 2023-03-14 DIAGNOSIS — I83228 Varicose veins of left lower extremity with both ulcer of other part of lower extremity and inflammation: Secondary | ICD-10-CM | POA: Diagnosis not present

## 2023-03-14 DIAGNOSIS — B353 Tinea pedis: Secondary | ICD-10-CM

## 2023-03-14 MED ORDER — KETOCONAZOLE 2 % EX CREA: TOPICAL_CREAM | CUTANEOUS | 2 refills | Status: DC

## 2023-03-14 NOTE — Patient Instructions (Addendum)
LEGS: Recommend [facility] Hypochlorous Spray (found in the wound care section) on. The [facility] Hypochlorous Spray can be sprayed on daily and left on.   The condition with your legs. Stasis dermatitis with stasis ulcers and Lipodermatosclerosis is a chronic condition. There is no cure, only management of the condition.   Lipodermatosclerosis is a chronic inflammatory condition of unknown cause of the subcutaneous fat causing tenderness, discoloration and hardening of the involved skin, most commonly on the lower legs. Discussed that it may progress and gradually worsen over time, especially in the setting of chronic leg swelling. Daily compression stockings/hose is recommended.   Continue following up with [facility].    Tinea on feet.  Continue Ketoconazole 2% cream twice daily to feet and between toes.    SCALP if itching and spongy feeling returns: Continue Ketoconazole 2% shampoo as directed: apply every other day massage into scalp and leave in for 5 minutes before rinsing out.   Continue Hydrocortisone 2.5% twice daily to ears and forehead as needed for itching.   Continue Lidex scalp solution qd/bid to aas scalp prn flares   Seborrheic Dermatitis  -  is a chronic persistent rash characterized by pinkness and scaling most commonly of the mid face but also can occur on the scalp (dandruff), ears; mid chest, mid back and groin.  It tends to be exacerbated by stress and cooler weather.  People who have neurologic disease may experience new onset or exacerbation of existing seborrheic dermatitis.  The condition is not curable but treatable and can be controlled.    Due to recent changes in healthcare laws, you may see results of your pathology and/or laboratory studies on MyChart before the doctors have had a chance to review them. We understand that in some cases there may be results that are confusing or concerning to you. Please understand that not all results are received at  the same time and often the doctors may need to interpret multiple results in order to provide you with the best plan of care or course of treatment. Therefore, we ask that you please give [place] 2 business days to thoroughly review all your results before contacting the office for clarification. Should we see a critical lab result, you will be contacted sooner.   If You Need Anything After Your Visit  If you have any questions or concerns for your doctor, please call our main line at [phone] and press option 4 to reach your doctor's medical assistant. If no one answers, please leave a voicemail as directed and we will return your call as soon as possible. Messages left after 4 pm will be answered the following business day.   You may also send [place] a message via [place]. We typically respond to MyChart messages within 1-2 business days.  For prescription refills, please ask your pharmacy to contact our office. Our fax number is [phone].  If you have an urgent issue when the clinic is closed that cannot wait until the next business day, you can page your doctor at the number below.    Please note that while we do our best to be available for urgent issues outside of office hours, we are not available 24/7.   If you have an urgent issue and are unable to reach [place], you may choose to seek medical care at your doctor's office, retail clinic, urgent care center, or emergency room.  If you have a medical emergency, please immediately call 911 or go to the emergency  department.  Pager Numbers  - Dr. Nehemiah Massed: [phone]  - Dr. Laurence Ferrari: [phone]  - Dr. Nicole Kindred: [phone]  In the event of inclement weather, please call our main line at [phone] for an update on the status of any delays or closures.  Dermatology Medication Tips: Please keep the boxes that topical medications come in in order to help keep track of the instructions about where and how to use these. Pharmacies  typically print the medication instructions only on the boxes and not directly on the medication tubes.   If your medication is too expensive, please contact our office at [phone] option 4 or send [place] a message through [place].   We are unable to tell what your co-pay for medications will be in advance as this is different depending on your insurance coverage. However, we may be able to find a substitute medication at lower cost or fill out paperwork to get insurance to cover a needed medication.   If a prior authorization is required to get your medication covered by your insurance company, please allow [place] 1-2 business days to complete this process.  Drug prices often vary depending on where the prescription is filled and some pharmacies may offer cheaper prices.  The website www.goodrx.com contains coupons for medications through different pharmacies. The prices here do not account for what the cost may be with help from insurance (it may be cheaper with your insurance), but the website can give you the price if you did not use any insurance.  - You can print the associated coupon and take it with your prescription to the pharmacy.  - You may also stop by our office during regular business hours and pick up a GoodRx coupon card.  - If you need your prescription sent electronically to a different pharmacy, notify our office through [facility] or by phone at [phone] option 4.     Si Usted Necesita Algo Despus de Su Visita  Tambin puede enviarnos un mensaje a travs de Pharmacist, community. Por lo general respondemos a los mensajes de MyChart en el transcurso de 1 a 2 das hbiles.  Para renovar recetas, por favor pida a su farmacia que se ponga en contacto con nuestra oficina. Harland Dingwall de fax es [place] [phone].  Si tiene un asunto urgente cuando la clnica est cerrada y que no puede esperar hasta el siguiente da hbil, puede llamar/localizar a su doctor(a) al nmero que  aparece a continuacin.   Por favor, tenga en cuenta que aunque hacemos todo lo posible para estar disponibles para asuntos urgentes fuera del horario de [place], no estamos disponibles las 24 horas del da, los 7 das de la [place].   Si tiene un problema urgente y no puede comunicarse con nosotros, puede optar por buscar atencin mdica  en el consultorio de su doctor(a), en una clnica privada, en un centro de atencin urgente o en una sala de emergencias.  Si tiene Engineering geologist, por favor llame inmediatamente al 911 o vaya a la sala de emergencias.  Nmeros de bper  - Dr. Nehemiah Massed: [phone]  - Dra. Moye: [phone]  - Dra. Nicole Kindred: [phone]  En caso de inclemencias del [place], por favor llame a Johnsie Kindred principal al [phone] para una actualizacin sobre el [place] de cualquier retraso o cierre.  Consejos para la medicacin en dermatologa: Por favor, guarde las cajas en las que vienen los medicamentos de uso tpico para ayudarle a seguir las instrucciones sobre dnde y cmo usarlos. Las [employer]  generalmente imprimen las instrucciones del medicamento slo en las cajas y no directamente en los tubos del [place].   Si su medicamento es muy caro, por favor, pngase en contacto con Zigmund Daniel llamando al [phone] y presione la opcin 4 o envenos un mensaje a travs de Pharmacist, community.   No podemos decirle cul ser su copago por los medicamentos por adelantado ya que esto es diferente dependiendo de la cobertura de su seguro. Sin embargo, es posible que podamos encontrar un medicamento sustituto a Electrical engineer un formulario para que el seguro cubra el medicamento que se considera necesario.   Si se requiere una autorizacin previa para que su compaa de seguros [place] su medicamento, por favor permtanos de 1 a 2 das hbiles para completar este proceso.  Los precios de los medicamentos varan con frecuencia dependiendo del Environmental consultant de dnde se surte  la receta y alguna farmacias pueden ofrecer precios ms baratos.  El sitio web www.goodrx.com tiene cupones para medicamentos de Airline pilot. Los precios aqu no tienen en cuenta lo que podra costar con la ayuda del seguro (puede ser ms barato con su seguro), pero el sitio web puede darle el precio si no utiliz Research scientist (physical sciences).  - Puede imprimir el cupn correspondiente y llevarlo con su receta a la farmacia.  - Tambin puede pasar por nuestra oficina durante el horario de atencin regular y Charity fundraiser una tarjeta de cupones de GoodRx.  - Si necesita que su receta se enve electrnicamente a una farmacia diferente, informe a nuestra oficina a travs de MyChart de [place] o por telfono llamando al [phone] y presione la opcin 4.

## 2023-03-14 NOTE — Progress Notes (Signed)
Follow-Up Visit   Subjective  Laura Santiago is a 77 y.o. female who presents for the following: lipodermatosclerosis of B/L lower legs. C/O ulceration at left leg again. Tinea pedis of both feet. . Using Ketoconazole shampoo, Fluocinonide solution and HC 2.5% cream for seb derm. HC 2.5% cream and under care again at [facility] for legs. Using Ketoconazole 2% cream as directed for feet. Has questions regarding these skin conditions. Concerned it is something systemic. Pt is convinced that all her skin conditions are related to one systemic cause.   The following portions of the chart were reviewed this encounter and updated as appropriate: medications, allergies, medical history  Review of Systems:  No other skin or systemic complaints except as noted in HPI or Assessment and Plan.  Objective  Well appearing patient in no apparent distress; mood and affect are within normal limits.   A focused examination was performed of the following areas: Legs, feet, scalp, neck, arms  Relevant exam findings are noted in the Assessment and Plan.  B/L lower legs Multiple ulcerations. Erythema with edema at B/L lower legs           Assessment & Plan   STASIS DERMATITIS With stasis ulcers Exam: Multiple large ulcerations see photos.  Also mild surrounding erythema and exfoliative scale  Chronic and persistent condition with duration or expected duration over one year. Condition is bothersome/symptomatic for patient. Currently flared.   Stasis in the legs causes chronic leg swelling, which may result in itchy or painful rashes, skin discoloration, skin texture changes, and sometimes ulceration.  Recommend daily graduated compression hose/stockings- easiest to put on first thing in morning, remove at bedtime.  Elevate legs as much as possible. Avoid salt/sodium rich foods.  Treatment Plan: Continue following up with [place]. Pt may need UNNA boot due to number and size  of ulcerations.  Discussed with patient that she needs to continue to f/up with wound treatment, since she is at higher risk for infection, which could have severe outcomes.  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 Condition is bothersome/symptomatic for patient. Currently flared.  Lipodermatosclerosis is a chronic inflammatory condition of unknown cause of the subcutaneous fat causing tenderness, discoloration and hardening of the involved skin, most commonly on the lower legs. Discussed that it may progress and gradually worsen over time, especially in the setting of chronic leg swelling. Daily compression stockings/hose is recommended.    Recommend [facility] Hypochlorous Spray (found in the wound care section) on. The [facility] Hypochlorous Spray can be sprayed on daily and left on.  Apply topical antibiotic ointment and cover  Continue follow up with [place]. Pt may need UNNA boot due to extensive involvement.  TINEA PEDIS Discussed that tinea is not cause of or related to scaly rash on legs (which is related to chronic swelling) Exam: Scaling at distal toes with dystrophic nails, web spaces,distal and lateral soles are clear. Improved. Treatment Plan: Continue Ketoconazole 2% cream to feet/toes and between toes every night at bedtime.  SEBORRHEIC DERMATITIS  Exam: mild scale at scalp, face, ears  Chronic condition with duration or expected duration over one year. Currently well-controlled. Discussed not related to chronic swelling and ulcerations on leg.  Seborrheic Dermatitis is a chronic persistent rash characterized by pinkness and scaling most commonly of the mid face  but also can occur on the scalp (dandruff), ears; mid chest, mid back and groin.  It tends to be exacerbated by stress and cooler  weather.  People who have neurologic disease may experience new onset or exacerbation of existing seborrheic dermatitis.  The condition is not curable but treatable and can be controlled.  Treatment Plan: Continue Ketoconazole 2% shampoo as directed: apply every other day massage into scalp and leave in for 5 minutes before rinsing out.   Continue Hydrocortisone 2.5% twice daily to ears and forehead as needed for itching.   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 Condition is symptomatic/ bothersome to patient. Not currently at goal.   Treatment Plan: Use Hydrocortisone 2.5% twice daily to affected areas on legs as needed for itching.   Avoid picking/scratching areas  Varicose Veins/Spider Veins - Dilated blue, purple or red veins at the lower extremities - Reassured - Smaller vessels can be treated by sclerotherapy (a procedure to inject a medicine into the veins to make them disappear) if desired, but the treatment is not covered by insurance. Larger vessels may be covered if symptomatic and we would refer to vascular surgeon if treatment desired.  SEBORRHEIC KERATOSIS - Stuck-on, waxy, tan-brown papules legs - Benign-appearing - Discussed benign etiology and prognosis. - Observe - Call for any changes   Return in about 3 months (around 06/13/2023).  I, Emelia Salisbury, CMA, am acting as scribe for Brendolyn Patty, MD.   Documentation: I have reviewed the above documentation for accuracy and completeness, and I agree with the above.  Brendolyn Patty, MD

## 2023-03-15 NOTE — Telephone Encounter (Signed)
Me  to Laura Santiago      02/15/23  3:51 PM Good afternoon    I left a message after last visit and today to get follow up appointments.  Please call the office to schedule follow up [place] and office visit [place] read by [name] at  4:01 PM on 02/15/2023.  Scheduled follow up with Dr Harrell Gave and sent mychart message

## 2023-03-16 ENCOUNTER — Other Ambulatory Visit: Payer: Self-pay | Admitting: Oncology

## 2023-03-16 ENCOUNTER — Ambulatory Visit: Payer: Medicare Other | Admitting: Oncology

## 2023-03-18 ENCOUNTER — Encounter: Payer: Self-pay | Admitting: Oncology

## 2023-03-20 ENCOUNTER — Ambulatory Visit: Payer: Medicare Other | Admitting: Oncology

## 2023-03-21 ENCOUNTER — Encounter (HOSPITAL_BASED_OUTPATIENT_CLINIC_OR_DEPARTMENT_OTHER): Payer: Medicare Other | Admitting: Internal Medicine

## 2023-03-21 DIAGNOSIS — L97822 Non-pressure chronic ulcer of other part of left lower leg with fat layer exposed: Secondary | ICD-10-CM

## 2023-03-21 DIAGNOSIS — I87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: Secondary | ICD-10-CM | POA: Diagnosis not present

## 2023-03-22 NOTE — Progress Notes (Signed)
DAMIEN, CISAR ([id number]) [id number] Page 1 of 10 Visit Report for 03/21/2023 Arrival Information Details Patient Name: Date of Service: Laura Santiago. 03/21/2023 3:00 PM Medical Record Number: [id number] Patient Account Number: [id number] Date of Birth/Sex: Treating RN: 28-Apr-1946 (77 y.o. Freddy Finner Primary Care Apolonia Ellwood: Marisue Ivan Other Clinician: Referring Rayleigh Gillyard: Treating Laurrie Toppin/Extender: Greta Doom in Treatment: 66 Visit Information History Since Last Visit Added or deleted any medications: No Patient Arrived: Walker Any new allergies or adverse reactions: No Arrival Time: 15:03 Had a fall or experienced change in No Accompanied By: self activities of daily living that may affect Transfer Assistance: None risk of falls: Patient Identification Verified: Yes Signs or symptoms of abuse/neglect since last visito No Secondary Verification Process Completed: Yes Hospitalized since last visit: No Patient Requires Transmission-Based No Implantable device outside of the clinic excluding No Precautions: cellular tissue based products placed in the center Patient Has Alerts: Yes since last visit: Patient Alerts: PT HAS SERVICE Has Dressing in Place as Prescribed: Yes ANIMAL Has Compression in Place as Prescribed: Yes ABI 07/11/21 Pain Present Now: No R) 1.16 L) 1.27 Electronic Signature(s) Signed: 03/22/2023 4:28:11 PM By: Yevonne Pax RN Entered By: Yevonne Pax on 03/21/2023 15:11:15 -------------------------------------------------------------------------------- Clinic Level of Care Assessment Details Patient Name: Date of Service: Laura Santiago. 03/21/2023 3:00 PM Medical Record Number: [id number] Patient Account Number: [id number] Date of Birth/Sex: Treating RN: 11-27-1946 (77 y.o. Freddy Finner Primary Care Millenia Waldvogel: Marisue Ivan Other Clinician: Referring Noel Henandez: Treating  Brae Gartman/Extender: Greta Doom in Treatment: 10 Clinic Level of Care Assessment Items TOOL 1 Quantity Score  - 0 Use when EandM and Procedure is performed on INITIAL visit ASSESSMENTS - Nursing Assessment / Reassessment  - 0 General Physical Exam (combine w/ comprehensive assessment (listed just below) when performed on new pt. evals)  - 0 Comprehensive Assessment (HX, ROS, Risk Assessments, Wounds Hx, etc.) ASSESSMENTS - Wound and Skin Assessment / Reassessment  - 0 Dermatologic / Skin Assessment (not related to wound area) ASSESSMENTS - Ostomy and/or Continence Assessment and Care  - 0 Incontinence Assessment and Management  - 0 Ostomy Care Assessment and Management (repouching, etc.) PROCESS - Coordination of Care  - 0 Simple Patient / Family Education for ongoing care  - 0 Complex (extensive) Patient / Family Education for ongoing care  - 0 Staff obtains Chiropractor, Records, T Results / Process Orders est  - 0 Staff telephones HHA, Nursing Homes / Clarify orders / etc  - 0 Routine Transfer to another Facility (non-emergent condition)  - 0 Routine Hospital Admission (non-emergent condition) CLYTEE, HEINRICH ([id number]) [id number] Page 2 of 10  - 0 New Admissions / Manufacturing engineer / Ordering NPWT Apligraf, etc. ,  - 0 Emergency Hospital Admission (emergent condition) PROCESS - Special Needs  - 0 Pediatric / Minor Patient Management  - 0 Isolation Patient Management  - 0 Hearing / Language / Visual special needs  - 0 Assessment of Community assistance (transportation, D/C planning, etc.)  - 0 Additional assistance / Altered mentation  - 0 Support Surface(s) Assessment (bed, cushion, seat, etc.) INTERVENTIONS - Miscellaneous  - 0 External ear exam  - 0 Patient Transfer (multiple staff / Nurse, adult / Similar devices)  - 0 Simple Staple / Suture  removal (25 or less)  - 0 Complex Staple / Suture removal (26 or more)  - 0 Hypo/Hyperglycemic Management (do not check if billed separately)  - 0 Ankle / Brachial  Index (ABI) - do not check if billed separately Has the patient been seen at the hospital within the last three years: Yes Total Score: 0 Level Of Care: ____ Electronic Signature(s) Signed: 03/22/2023 4:28:11 PM By: Yevonne Pax RN Entered By: Yevonne Pax on 03/21/2023 15:46:08 -------------------------------------------------------------------------------- Compression Therapy Details Patient Name: Date of Service: Laura Muscat NNIE Santiago. 03/21/2023 3:00 PM Medical Record Number: [id number] Patient Account Number: [id number] Date of Birth/Sex: Treating RN: 20-Jun-1946 (77 y.o. Freddy Finner Primary Care Mahlet Jergens: Marisue Ivan Other Clinician: Referring Dim Meisinger: Treating Cerissa Zeiger/Extender: Greta Doom in Treatment: 48 Compression Therapy Performed for Wound Assessment: Wound #10 Left,Posterior Lower Leg Performed By: Clinician Yevonne Pax, RN Compression Type: Three Layer Post Procedure Diagnosis Same as Pre-procedure Electronic Signature(s) Signed: 03/22/2023 4:28:11 PM By: Yevonne Pax RN Entered By: Yevonne Pax on 03/21/2023 15:45:05 -------------------------------------------------------------------------------- Compression Therapy Details Patient Name: Date of Service: Laura Muscat NNIE Santiago. 03/21/2023 3:00 PM Medical Record Number: [id number] Patient Account Number: [id number] Date of Birth/Sex: Treating RN: [birth year] (77 y.o. Freddy Finner Primary Care Elle Vezina: Marisue Ivan Other Clinician: Referring Hussam Muniz: Treating Annslee Tercero/Extender: Greta Doom in Treatment: 45 Compression Therapy Performed for Wound Assessment: Wound #11 Left,Lateral Lower Leg Performed By: Clinician Yevonne Pax, RN Compression Type: Three Layer Post  Procedure Diagnosis Same as Pre-procedure Laura Santiago, Laura Santiago ([id number]) [id number] Page 3 of 10 Electronic Signature(s) Signed: 03/22/2023 4:28:11 PM By: Yevonne Pax RN Entered By: Yevonne Pax on 03/21/2023 15:45:19 -------------------------------------------------------------------------------- Compression Therapy Details Patient Name: Date of Service: Laura Muscat NNIE Santiago. 03/21/2023 3:00 PM Medical Record Number: [id number] Patient Account Number: [id number] Date of Birth/Sex: Treating RN: 1946-10-03 (77 y.o. Freddy Finner Primary Care Jakita Dutkiewicz: Marisue Ivan Other Clinician: Referring Wyoma Genson: Treating Lyndol Vanderheiden/Extender: Greta Doom in Treatment: 58 Compression Therapy Performed for Wound Assessment: Wound #12 Left,Medial Ankle Performed By: Clinician Yevonne Pax, RN Compression Type: Three Layer Post Procedure Diagnosis Same as Pre-procedure Electronic Signature(s) Signed: 03/22/2023 4:28:11 PM By: Yevonne Pax RN Entered By: Yevonne Pax on 03/21/2023 15:45:33 -------------------------------------------------------------------------------- Encounter Discharge Information Details Patient Name: Date of Service: Laura Santiago, Laura Santiago NNIE Santiago. 03/21/2023 3:00 PM Medical Record Number: [id number] Patient Account Number: [id number] Date of Birth/Sex: Treating RN: May 23, 1946 (77 y.o. Freddy Finner Primary Care Dory Verdun: Marisue Ivan Other Clinician: Referring Daisy Mcneel: Treating Ludivina Guymon/Extender: Greta Doom in Treatment: 41 Encounter Discharge Information Items Discharge Condition: Stable Ambulatory Status: Walker Discharge Destination: Home Transportation: Private Auto Accompanied By: self Schedule Follow-up Appointment: Yes Clinical Summary of Care: Electronic Signature(s) Signed: 03/22/2023 4:28:11 PM By: Yevonne Pax RN Entered By: Yevonne Pax on 03/21/2023  16:00:05 -------------------------------------------------------------------------------- Lower Extremity Assessment Details Patient Name: Date of Service: Laura Santiago. 03/21/2023 3:00 PM Medical Record Number: [id number] Patient Account Number: [id number] Date of Birth/Sex: Treating RN: 11-22-1946 (77 y.o. Freddy Finner Primary Care Emeka Lindner: Marisue Ivan Other Clinician: Referring Takoda Janowiak: Treating Akash Winski/Extender: Claudie Fisherman Weeks in Treatment: 84 Edema Assessment Assessed: [Left: No] [Right: No] Edema: [Left: Ye] [Right: s] Calf Left: Right: Laura Santiago, Laura Santiago ([id number]) [id number] Page 4 of 10 Point of Measurement: 35 cm From Medial Instep 42 cm Ankle Left: Right: Point of Measurement: 12 cm From Medial Instep 23 cm Vascular Assessment Pulses: Dorsalis Pedis Palpable: [Left:Yes] Electronic Signature(s) Signed: 03/22/2023 4:28:11 PM By: Yevonne Pax RN Entered By: Yevonne Pax on 03/21/2023 15:18:26 -------------------------------------------------------------------------------- Multi Wound Chart Details Patient Name: Date of Service: Laura Santiago, BO NNIE Santiago. 03/21/2023 3:00 PM Medical  Record Number: [id number] Patient Account Number: [id number] Date of Birth/Sex: Treating RN: 10/10/1946 [age] y.o. Freddy FinnerF) Laura Santiago, Laura Santiago Primary Care Kamelia Lampkins: Marisue IvanLinthavong, Kanhka Other Clinician: Referring Atara Paterson: Treating Ramzy Cappelletti/Extender: Greta DoomHoffman, Jessica Linthavong, Kanhka Weeks in Treatment: 10589 Vital Signs Height(in): 66 Pulse(bpm): 74 Weight(lbs): 153 Blood Pressure(mmHg): 122/65 Body Mass Index(BMI): 24.7 Temperature(F): 98.3 Respiratory Rate(breaths/min): 18 [10:Photos:] Left, Posterior Lower Leg Left, Lateral Lower Leg Left, Medial Ankle Wound Location: Gradually Appeared Gradually Appeared Gradually Appeared Wounding Event: Venous Leg Ulcer Venous Leg Ulcer Venous Leg Ulcer Primary Etiology: Hypertension,  Osteoarthritis, ReceivedHypertension, Osteoarthritis, ReceivedHypertension, Osteoarthritis, Received Comorbid History: Chemotherapy, Received Radiation Chemotherapy, Received Radiation Chemotherapy, Received Radiation 03/04/2023 03/04/2023 03/04/2023 Date Acquired: 1 1 1  Weeks of Treatment: Open Open Open Wound Status: No No No Wound Recurrence: Yes No Yes Clustered Wound: 7x9.5x0.1 2x1.3x0.1 2.5x5x0.1 Measurements L x W x D (cm) 52.229 2.042 9.817 A (cm) : rea 5.223 0.204 0.982 Volume (cm) : 12.50% -53.00% -225.50% % Reduction in Area: 12.50% -52.20% -225.20% % Reduction in Volume: Full Thickness Without Exposed Full Thickness Without Exposed Full Thickness Without Exposed Classification: Support Structures Support Structures Support Structures Medium Medium Medium Exudate Amount: Serosanguineous Serosanguineous Serosanguineous Exudate Type: red, brown red, brown red, brown Exudate Color: Small (1-33%) Small (1-33%) Small (1-33%) Granulation Amount: N/A Red Pink Granulation Quality: Large (67-100%) Large (67-100%) Large (67-100%) Necrotic Amount: Fat Layer (Subcutaneous Tissue): Yes Fat Layer (Subcutaneous Tissue): Yes Fat Layer (Subcutaneous Tissue): Yes Exposed Structures: Fascia: No Fascia: No Fascia: No Tendon: No Tendon: No Tendon: No Muscle: No Muscle: No Muscle: No Joint: No Joint: No Joint: No Bone: No Bone: No Bone: No N/A None None EpithelializationLacie Santiago: Gomez, Lurene Santiago ([id number]) [id number] Page 5 of 10 Treatment Notes Electronic Signature(s) Signed: 03/22/2023 4:28:11 PM By: Yevonne PaxEpps, Carrie RN Entered By: Yevonne PaxEpps, Laura Santiago on 03/21/2023 15:18:38 -------------------------------------------------------------------------------- Multi-Disciplinary Care Plan Details Patient Name: Date of Service: Laura SpearmanLA MBERT, Laura PetersBO NNIE Santiago. 03/21/2023 3:00 PM Medical Record Number: [id number] Patient Account Number: [id number] Date of Birth/Sex:  Treating RN: 11/01/1946 (77 y.o. Freddy FinnerF) Laura Santiago, Laura Santiago Primary Care Yehudis Monceaux: Marisue IvanLinthavong, Kanhka Other Clinician: Referring Livian Vanderbeck: Treating Sylvie Mifsud/Extender: Claudie FishermanHoffman, Jessica Linthavong, Kanhka Weeks in Treatment: 189 Active Inactive Electronic Signature(s) Signed: 03/22/2023 4:28:11 PM By: Yevonne PaxEpps, Carrie RN Entered By: Yevonne PaxEpps, Laura Santiago on 03/21/2023 15:18:54 -------------------------------------------------------------------------------- Pain Assessment Details Patient Name: Date of Service: Laura MuscatLA MBERT, BO NNIE Santiago. 03/21/2023 3:00 PM Medical Record Number: [id number] Patient Account Number: [id number] Date of Birth/Sex: Treating RN: 07/23/1946 (77 y.o. Freddy FinnerF) Laura Santiago, Laura Santiago Primary Care Catori Panozzo: Marisue IvanLinthavong, Kanhka Other Clinician: Referring Jailon Schaible: Treating Bellamy Rubey/Extender: Greta DoomHoffman, Jessica Linthavong, Kanhka Weeks in Treatment: 4789 Active Problems Location of Pain Severity and Description of Pain Patient Has Paino No Site Locations Pain Management and Medication Current Pain Management: Electronic Signature(s) Signed: 03/22/2023 4:28:11 PM By: Yevonne PaxEpps, Carrie RN Entered By: Yevonne PaxEpps, Laura Santiago on 03/21/2023 15:11:41 Laura DraftLAMBERT, Laura Santiago ([id number]) [id number] Page 6 of 10 -------------------------------------------------------------------------------- Patient/Caregiver Education Details Patient Name: Date of Service: Laura LandryLA MBERT, BO NNIE Santiago. 4/10/2024andnbsp3:00 PM Medical Record Number: [id number] Patient Account Number: [id number] Date of Birth/Gender: Treating RN: 06/21/1946 (77 y.o. Freddy FinnerF) Laura Santiago, Laura Santiago Primary Care Physician: Marisue IvanLinthavong, Kanhka Other Clinician: Referring Physician: Treating Physician/Extender: Greta DoomHoffman, Jessica Linthavong, Kanhka Weeks in Treatment: 2089 Education Assessment Education Provided To: Patient Education Topics Provided Wound/Skin Impairment: Handouts: Other: dressing changes Methods: Explain/Verbal Responses: State content correctly Electronic  Signature(s) Signed: 03/22/2023 4:28:11 PM By: Yevonne PaxEpps, Carrie RN Entered By: Yevonne PaxEpps, Laura Santiago on 03/21/2023 15:19:39 -------------------------------------------------------------------------------- Wound Assessment Details Patient Name: Date of Service: Laura MuscatLA MBERT, BO NNIE Santiago. 03/21/2023 3:00 PM Medical  Record Number: [id number] Patient Account Number: [id number] Date of Birth/Sex: Treating RN: [birth year] (77 y.o. Freddy Finner Primary Care Jash Wahlen: Marisue Ivan Other Clinician: Referring Lucille Witts: Treating Roe Koffman/Extender: Greta Doom in Treatment: 31 Wound Status Wound Number: 10 Primary Venous Leg Ulcer Etiology: Wound Location: Left, Posterior Lower Leg Wound Status: Open Wounding Event: Gradually Appeared Comorbid Hypertension, Osteoarthritis, Received Chemotherapy, Date Acquired: 03/04/2023 History: Received Radiation Weeks Of Treatment: 1 Clustered Wound: Yes Photos Wound Measurements Length: (cm) 7 Width: (cm) 9.5 Depth: (cm) 0.1 Area: (cm) 52.229 Volume: (cm) 5.223 % Reduction in Area: 12.5% % Reduction in Volume: 12.5% Tunneling: No Undermining: No Wound Description Classification: Full Thickness Without Exposed Support Structures Exudate Amount: Medium Exudate Type: Serosanguineous Laura Santiago, Laura Santiago ([id number]) Exudate Color: red, brown Foul Odor After Cleansing: No Slough/Fibrino Yes [phone].pdf Page 7 of 10 Wound Bed Granulation Amount: Small (1-33%) Exposed Structure Necrotic Amount: Large (67-100%) Fascia Exposed: No Necrotic Quality: Adherent Slough Fat Layer (Subcutaneous Tissue) Exposed: Yes Tendon Exposed: No Muscle Exposed: No Joint Exposed: No Bone Exposed: No Treatment Notes Wound #10 (Lower Leg) Wound Laterality: Left, Posterior Cleanser Peri-Wound Care Topical Gentamicin Discharge Instruction: Apply as directed by Cloe Sockwell. Mupirocin Ointment Discharge Instruction: Apply as  directed by Kaniesha Barile. Primary Dressing Hydrofera Blue Ready Transfer Foam, 2.5x2.5 (in/in) Discharge Instruction: Apply Hydrofera Blue Ready to wound bed as directed Secondary Dressing ABD Pad 5x9 (in/in) Discharge Instruction: Cover with ABD pad Secured With Compression Wrap 3-LAYER WRAP - Profore Lite LF 3 Multilayer Compression Bandaging System Discharge Instruction: Apply 3 multi-layer wrap as prescribed. Compression Stockings Add-Ons Electronic Signature(s) Signed: 03/22/2023 4:28:11 PM By: Yevonne Pax RN Entered By: Yevonne Pax on 03/21/2023 15:16:51 -------------------------------------------------------------------------------- Wound Assessment Details Patient Name: Date of Service: Laura Muscat NNIE Santiago. 03/21/2023 3:00 PM Medical Record Number: [id number] Patient Account Number: [id number] Date of Birth/Sex: Treating RN: Aug 13, 1946 (77 y.o. Freddy Finner Primary Care Zawadi Aplin: Marisue Ivan Other Clinician: Referring Rayya Yagi: Treating Aaylah Pokorny/Extender: Claudie Fisherman Weeks in Treatment: 40 Wound Status Wound Number: 11 Primary Venous Leg Ulcer Etiology: Wound Location: Left, Lateral Lower Leg Wound Status: Open Wounding Event: Gradually Appeared Comorbid Hypertension, Osteoarthritis, Received Chemotherapy, Date Acquired: 03/04/2023 History: Received Radiation Weeks Of Treatment: 1 Clustered Wound: No Photos Laura Santiago, Laura Santiago ([id number]) [id number] Page 8 of 10 Wound Measurements Length: (cm) 2 Width: (cm) 1.3 Depth: (cm) 0.1 Area: (cm) 2.042 Volume: (cm) 0.204 % Reduction in Area: -53% % Reduction in Volume: -52.2% Epithelialization: None Tunneling: No Undermining: No Wound Description Classification: Full Thickness Without Exposed Suppor Exudate Amount: Medium Exudate Type: Serosanguineous Exudate Color: red, brown t Structures Foul Odor After Cleansing: No Slough/Fibrino Yes Wound  Bed Granulation Amount: Small (1-33%) Exposed Structure Granulation Quality: Red Fascia Exposed: No Necrotic Amount: Large (67-100%) Fat Layer (Subcutaneous Tissue) Exposed: Yes Necrotic Quality: Adherent Slough Tendon Exposed: No Muscle Exposed: No Joint Exposed: No Bone Exposed: No Treatment Notes Wound #11 (Lower Leg) Wound Laterality: Left, Lateral Cleanser Peri-Wound Care Topical Gentamicin Discharge Instruction: Apply as directed by Jeptha Hinnenkamp. Mupirocin Ointment Discharge Instruction: Apply as directed by Bastion Bolger. Primary Dressing Hydrofera Blue Ready Transfer Foam, 2.5x2.5 (in/in) Discharge Instruction: Apply Hydrofera Blue Ready to wound bed as directed Secondary Dressing ABD Pad 5x9 (in/in) Discharge Instruction: Cover with ABD pad Secured With Compression Wrap 3-LAYER WRAP - Profore Lite LF 3 Multilayer Compression Bandaging System Discharge Instruction: Apply 3 multi-layer wrap as prescribed. Compression Stockings Add-Ons Electronic Signature(s) Signed: 03/22/2023 4:28:11 PM By: Yevonne Pax RN Entered By: Yevonne Pax on  03/21/2023 15:17:19 Laura Santiago, Laura Santiago ([id number]) [id number] Page 9 of 10 -------------------------------------------------------------------------------- Wound Assessment Details Patient Name: Date of Service: Laura Santiago. 03/21/2023 3:00 PM Medical Record Number: [id number] Patient Account Number: [id number] Date of Birth/Sex: Treating RN: 1946-08-27 (77 y.o. Freddy Finner Primary Care Keano Guggenheim: Marisue Ivan Other Clinician: Referring Talin Feister: Treating Campbell Kray/Extender: Greta Doom in Treatment: 46 Wound Status Wound Number: 12 Primary Venous Leg Ulcer Etiology: Wound Location: Left, Medial Ankle Wound Status: Open Wounding Event: Gradually Appeared Comorbid Hypertension, Osteoarthritis, Received Chemotherapy, Date Acquired: 03/04/2023 History: Received  Radiation Weeks Of Treatment: 1 Clustered Wound: Yes Photos Wound Measurements Length: (cm) 2.5 Width: (cm) 5 Depth: (cm) 0.1 Area: (cm) 9.817 Volume: (cm) 0.982 % Reduction in Area: -225.5% % Reduction in Volume: -225.2% Epithelialization: None Tunneling: No Undermining: No Wound Description Classification: Full Thickness Without Exposed Suppor Exudate Amount: Medium Exudate Type: Serosanguineous Exudate Color: red, brown t Structures Foul Odor After Cleansing: No Slough/Fibrino Yes Wound Bed Granulation Amount: Small (1-33%) Exposed Structure Granulation Quality: Pink Fascia Exposed: No Necrotic Amount: Large (67-100%) Fat Layer (Subcutaneous Tissue) Exposed: Yes Necrotic Quality: Adherent Slough Tendon Exposed: No Muscle Exposed: No Joint Exposed: No Bone Exposed: No Treatment Notes Wound #12 (Ankle) Wound Laterality: Left, Medial Cleanser Peri-Wound Care Topical Gentamicin Discharge Instruction: Apply as directed by Roselin Wiemann. Mupirocin Ointment Discharge Instruction: Apply as directed by Nailea Whitehorn. Primary Dressing Hydrofera Blue Ready Transfer Foam, 2.5x2.5 (in/in) Discharge Instruction: Apply Hydrofera Blue Ready to wound bed as directed Secondary Dressing ABD Pad 5x9 (in/in) Laura Santiago, Laura Santiago ([id number]) [id number] Page 10 of 10 Discharge Instruction: Cover with ABD pad Secured With Compression Wrap 3-LAYER WRAP - Profore Lite LF 3 Multilayer Compression Bandaging System Discharge Instruction: Apply 3 multi-layer wrap as prescribed. Compression Stockings Add-Ons Electronic Signature(s) Signed: 03/22/2023 4:28:11 PM By: Yevonne Pax RN Entered By: Yevonne Pax on 03/21/2023 15:17:40 -------------------------------------------------------------------------------- Vitals Details Patient Name: Date of Service: Laura Santiago, BO NNIE Santiago. 03/21/2023 3:00 PM Medical Record Number: [id number] Patient Account Number: [id number] Date of  Birth/Sex: Treating RN: 29-Dec-1945 (77 y.o. Freddy Finner Primary Care Gustie Bobb: Marisue Ivan Other Clinician: Referring Myalee Stengel: Treating Ahuva Poynor/Extender: Greta Doom in Treatment: 26 Vital Signs Time Taken: 15:11 Temperature (F): 98.3 Height (in): 66 Pulse (bpm): 74 Weight (lbs): 153 Respiratory Rate (breaths/min): 18 Body Mass Index (BMI): 24.7 Blood Pressure (mmHg): 122/65 Reference Range: 80 - 120 mg / dl Electronic Signature(s) Signed: 03/22/2023 4:28:11 PM By: Yevonne Pax RN Entered By: Yevonne Pax on 03/21/2023 15:11:33

## 2023-03-22 NOTE — Progress Notes (Signed)
Laura Santiago, Laura Santiago ([id number]) [id number] Page 1 of 13 Visit Report for 03/21/2023 Chief Complaint Document Details Patient Name: Date of Service: Laura Santiago. 03/21/2023 3:00 PM Medical Record Number: [id number] Patient Account Number: [id number] Date of Birth/Sex: Treating RN: 26-Nov-1946 (77 y.o. Laura Santiago Primary Care Provider: Marisue Santiago Other Clinician: Referring Provider: Treating Provider/Extender: Laura Santiago in Treatment: 36 Information Obtained from: Patient Chief Complaint Left lower extremity wound Right toe wounds Left upper lateral thigh wounds Electronic Signature(s) Signed: 03/21/2023 4:29:14 PM By: Laura Corwin DO Entered By: Laura Santiago on 03/21/2023 15:45:53 -------------------------------------------------------------------------------- HPI Details Patient Name: Date of Service: Laura Santiago, Laura Santiago NNIE Santiago. 03/21/2023 3:00 PM Medical Record Number: [id number] Patient Account Number: [id number] Date of Birth/Sex: Treating RN: 01-14-1946 (77 y.o. Laura Santiago Primary Care Provider: Marisue Santiago Other Clinician: Referring Provider: Treating Provider/Extender: Laura Santiago in Treatment: 76 History of Present Illness HPI Description: Admission 7/27 Laura Santiago is a 77 year old female with a past medical history of ADHD, metastatic breast cancer, stage IV chronic kidney disease, history of DVT on Xarelto and chronic venous insufficiency that presents to the clinic for a chronic left lower extremity wound. She recently moved to [facility] 4 days ago. She was being followed by wound care center in [place]. She reports a 10-year history of wounds to her left lower extremity that eventually do heal with debridement and compression therapy. She states that the current wound reopened 4 months ago and she is using Vaseline and Coban. She  denies signs of infection. 8/3; patient presents for 1 week follow-up. She reports no issues or complaints today. She states she had vascular studies done in the last week. She denies signs of infection. She brought her little service dog with her today. 8/17; patient presents for follow-up. She has missed her last clinic appointment. She states she took the wrap off and attempted to rewrap her leg. She is having difficulty with transportation. She has her service dog with her today. Overall she feels well and reports improvement in wound healing. She denies signs of infection. She reports owning an old Velcro wrap compression and has this at her living facility 9/14; patient presents for follow-up. Patient states that over the past 2 to 3 weeks she developed toe wounds to her right foot. She attributes this to tight fitting shoes. She subsequently developed cellulitis in the right leg and has been treated by doxycycline by her oncologist. She reports improvement in symptoms however continues to have some redness and swelling to this leg. T the left lower extremity patient has been having her wraps changed with home health twice weekly. She states that the [facility] is not helping control o the drainage. Other than that she has no issues or complaints today. She denies signs of infection to the left lower extremity. 9/21; patient presents for follow-up. She reports seeing infectious disease for her cellulitis. She reports no further management. She has home health that changes the wraps twice weekly. She has no issues or complaints today. She denies signs of infection. 10/5; patient presents for follow-up. She has no issues or complaints today. She denies signs of infection. She states that the right great toe has not been dressed by home health. 10/12; patient presents for follow-up. She has no issues or complaints today. She reports improvement in her wound healing. She has been using silver  alginate to the right great toe wound. She denies signs of  infection. 10/26; patient presents for follow-up. Home health did not have sorbact so they continued to use Hydrofera Blue under the wrap. She has been using silver alginate to the great toe wound however she did not have a dressing in place today. She currently denies signs of infection. 11/2; patient presents for follow-up. She has been using sorb act under the compression wrap. She reports using silver alginate to the toe wound again she does not have a dressing in place. She currently denies signs of infection. 11/23; patient presents for follow-up. Unfortunately she has missed her last 2 clinic appointments. She was last seen 3 weeks ago. She did her own compression wrap with Kerlix and Coban yesterday after seeing vein and vascular. She has not been dressing her right great toe wound. She currently denies signs of infection. 11/30; patient presents for 1 week follow-up. She states she changed her dressing last week prior to home health and use sorb act with Dakin's and PERLIE, STENE ([id number]) [id number] Page 2 of 13 Blue. Home health has changed the dressing as well and they have been using sorbact. T oday she reports increased redness to her right lower extremity. She has a history of cellulitis to this leg. She has been using silver alginate to the right great toe. Unfortunately she had an episode of diarrhea prior to coming in and had feces all over the right leg and to the wrap of her left leg. 12/7; patient presents for 1 week follow-up. She states that home health did not come out to change the dressing and she took it off yesterday. It is unclear if she is dressing the right toe wound. She denies signs of infection. 12/14; patient presents for 1 week follow-up. She has no issues or complaints today. 12/21; patient presents for follow-up. She has no issues or complaints today. She  denies signs of infection. 12/28/2021; patient presents for follow-up. She was hospitalized for sepsis secondary to right lower extremity cellulitis On 12/23. She states she is currently at a SNF. She states that she was started on doxycycline this morning for her right great toe swelling and redness. She is not sure what dressings have been done to her left lower extremity for the past 3 weeks. She says its been mainly gauze with an Ace wrap. 1/25; patient presents for follow-up. She is still residing in a skilled nursing facility. She reports mild pain to the left lower extremity wound bed. She states she is going to see a podiatrist soon. 2/8; patient presents for follow-up. She has moved back to her residential community from her skilled nursing facility. She has no issues or complaints today. She denies signs of systemic infections. 2/15; patient presents for follow-up. He has no issues or complaints today. She denies systemic signs of infection. 2/22; patient presents for follow-up. She has no issues or complaints today. She denies signs of infection. 3/1; patient presents for follow-up. She states that home health came out the day after she was seen in our clinic and yesterday to do the wrap change. She denies signs of infection. She reports excoriated skin on the ankle. 3/8; patient presents for follow-up. She has no issues or complaints today. She denies signs of infection. 3/15; patient presents for follow-up. Home health has been coming out to change the dressings. She reports more tenderness to the wound site. She denies purulent drainage, increased warmth or erythema to the area. 4/5; patient presents for follow-up. She has missed her last 2  clinic appointments. I have not seen her in 3 weeks. She was recently hospitalized for altered mental status. She was involuntarily committed. She was evaluated by psychiatry and deemed to have competency. There was no specific cause of her altered  mental status. It was concluded that her physical and mental health were declining due to her chronic medical conditions. Currently home health has been coming out for dressing changes. Patient has also been doing her own dressing changes. She reports more skin breakdown to the periwound and now has a new wound. She denies fever/chills. She reports continued tenderness to the wound site. 4/12; patient with significant venous insufficiency and a large wound on her left lower leg taking up about 80% of the circumference of her lower leg. Cultures of this grew MRSA and Pseudomonas. She had completed a course of ciprofloxacin now is starting doxycycline. She has been using Dakin's wet-to-dry and a Tubigrip. She has home health twice a week and we change it once. 4/19; patient presents for follow-up. She completed her course of doxycycline. She has been using Dakin's wet-to-dry dressing and Tubigrip. Home health changes the dressing twice weekly. Currently she has no issues or complaints. 4/26; patient presents for follow-up. At last clinic visit orders for home health were Iodosorb under compression therapy. Unfortunately they did not have the dressing and have been using Dakin's and gentamicin under the wrap. Patient currently denies signs of infection. She has no issues or complaints today. 5/3; patient presents for follow-up. Again Iodosorb has not been used under the compression therapy when home health comes out to change the wrap and dressing. They have been using Sorbact. It is unclear why this is happening since we send orders weekly to the agency. She denies signs of infection. Patient has not purchased the [facility] antibiotics. We reached out to the company and they said they have been trying to contact her on a regular basis. We gave the patient the number to call to order the medication. 5/10; patient presents for follow-up. She has no issues or complaints today. Again home health has not been  using Iodosorb. Mepilex was on the wound bed. No other dressings noted. She brought in her Keystone antibiotics. She denies signs of infection. 5/17; patient presents for follow-up. Home health has come out twice since she was last seen. Joint well she has been using Keystone antibiotic with Sorbact under the compression wrap. She has no issues or complaints today. She denies signs of infection. 5/24; patient presents for follow-up. We have been using Keystone antibiotics with Sorbact under compression therapy. She is tolerating the treatment well. She is reporting improvement in wound healing. She denies signs of infection. 5/31; patient presents for follow-up. We continue to do [facility] antibiotics with Sorbact under compression therapy. She continues to report improvement in wound healing. Home health comes out and changes the dressing once weekly. 05-17-2022 upon evaluation today patient appears to be doing better in regard to her wound especially compared to the last time I saw her. Fortunately I do think that she is seeing improvements. With that being said I do believe that she may be benefit from sharp debridement today to clear away some of the necrotic debris I discussed that with her as well. She is an amendable to that plan. Otherwise she is very pleased with how the Jodie Echevaria is doing for her. 6/14; patient presents for follow-up. We have been using Keystone antibiotic with Sorbact and absorbent dressings under 3 layer compression. She has no  issues or complaints today. She reports improvement in wound healing. She denies signs of infection. 6/21; patient presents for follow-up. We are continuing with [facility] antibiotic and Sorbact under 3 layer compression. Patient has no complaints. Continued wound healing is happening. She denies signs of infection. 6/28; patient presents for follow-up. We have been using Keystone antibiotic with Sorbact under 3 layer compression. Usually home health  comes out and changes the dressing twice a week. Unfortunately they did not go out to change the dressing. It is unclear why. Patient did not call them. She currently denies signs of infection. 7/5; patient presents for follow-up. We have been using Keystone antibiotic with calcium alginate under 3 layer compression. She reports improvement in wound healing. She denies signs of infection. Home health has come out to do dressing changes twice this past week. 7/12; patient presents for follow-up. We have been using Keystone antibiotic with calcium alginate under 3 layer compression. Patient states that home health came out once last week to change the dressing. She reports improvement in wound healing. She currently denies signs of infection. 7/19; patient presents for follow-up. We have been using Keystone antibiotic with calcium alginate under 3 layer compression. Home health came out once last week to change the dressing. She has no issues or complaints today. She denies signs of infection. 8/2; patient presents for follow-up. We have been using Keystone antibiotic with calcium alginate under 3 layer compression. Unfortunately she missed her appointment last week and home health did not come out to do dressing changes. Patient currently denies signs of infection. Laura Santiago, Laura Santiago ([id number]) [id number] Page 3 of 13 8/9; patient presents for follow-up. We have been using Keystone with calcium alginate under 3 layer compression. She states that home health came out once last week. She currently denies signs of infection. Her wrap was completely wet. She states she was cleaning the top of the leg and water soaked down into the wrap. 8/16; patient presents for follow-up. We have been using Keystone with calcium alginate under 3 layer compression. She states that home health came out twice last week. She has no issues or complaints today. 8/23; patient presents for  follow-up. He has been using Keystone with calcium alginate under 3 layer compression. Home health came out twice last week. She denies signs of infection. 8/30; patient presents for follow-up. We have been using Keystone with calcium alginate under 3 layer compression. Home health came out once last week to change the dressing. Patient reports improvement in wound healing. She states she is almost done with her chemotherapy infusions and has 1 more left. 9/13; patient presents for follow-up. She has lost the capsules to her [facility] antibiotic which I believe is the vancomycin pills. She has her Zosyn powder today. We have been using Keystone antibiotic ointment with calcium alginate under 3 layer compression. She is concerned about systemic infection however her vitals are stable and there is no surrounding soft tissue infection. She would like to remain a patient in our wound care center however would like a second opinion for her wound care at another facility. She asked to be referred to [facility] wound care center. 9/20; patient presents for follow-up. She found her vancomycin capsules and brought in her complete Keystone antibiotic ointment set today. Unfortunately she has developed skin breakdown and Erythema to the right lower extremity With increased swelling. She states she went to a pow wow Over the weekend and was on her feet for extended periods of time. She  saw her oncologist yesterday who prescribed her doxycycline for her right lower extremity erythema. 9/27; patient presents for follow-up. We have been using Keystone antibiotic with Aquacel under 3 layer compression to the lower extremities bilaterally. When home health came and changed the wrap she secretly put coffee into the spray mix along with [facility] antibiotic on her leg thinking the acidic component would better activate the zoysn (sonething she discussed with her microbiologist brother). She has reported improvement in wound  healing. 10/4; patient presents for follow-up. She has no issues or complaints today. We have been doing Aquacel and keystone under 3 layer compression to the lower extremities bilaterally. This morning she took the right lower extremity wrap off as it was uncomfortable. She has no open wounds to this leg. 10/11; patient presents for follow-up. We have been doing Aquacel with Keystone antibiotic ointment under 3 layer compression to the left lower extremity. She developed a small blister to the anterior aspect of the left leg noticed when the wrap was taken off on intake. She currently denies signs of infection. 10/18; patient presents for follow-up. We have been doing Aquacel with Keystone antibiotic ointment under 3 layer compression to the left lower extremity. There has been continued improvement in wound healing. She denies signs of infection. 10/25; patient arrives for treatment of venous insufficiency ulcers on her left lower leg both lateral and medial are remanence of apparently a circumferential wound. Much improved. We are using topicals Keystone and Aquacel Ag under 3 layer compression we continue to make good progress. The patient talk to me at some length with regards to different things she has on her forehead and her Peri orbital area for which she is apparently applying [place]. She feels that what ever we are treating on her wounds is a more systemic problem. I really was not able to get a handle on what she is talking about however I did caution her not to put the [place] in her eyes. 11/1; her wounds continue to improve she is using Keystone and Aquacel Ag G under 3 layer compression. Our intake nurse notes erythema and edema in the right leg. The patient has a litany of concerns with regards to a rash on her forehead or ears and other systemic complaints. She has an appointment with dermatology on November 11 11/8; patient presents for follow-up. We have been using Keystone and  Aquacel under 4-layer compression. She has no issues or complaints today. She reports improvement in wound healing. 11/15; patient presents for follow-up. We have been using Aquacel with Keystone antibiotic under 3 layer compression. Patient continues improvement in wound healing. 12/6; patient presents for follow-up. We have been using Aquacel with Keystone antibiotic ointment under 3 layer compression. Wounds appear well-healing. 12/13; patient presents for follow-up. We have been using Aquacel with Keystone antibiotic under 3 layer compression. She has no issues or complaints today. 12/27 left lateral medial ankle. Superficial wounds remain there is significantly improved we are using Keystone backed with Zetuvit under 4-layer compression 1/3; patient presents for follow-up. Her wounds appear well-healing. We have been using Aquacel Ag with Keystone antibiotic ointment under compression therapy. This should be a 3 layer compression. 1/17; patient presents for follow-up. Her wounds on her left lower extremity are well-healing. We are using Aquacel Ag and Keystone antibiotic ointment under compression therapy. She missed her last clinic appointment. She states that the wrap has been changed twice weekly since she was last seen. Unfortunately she has developed increased warmth and  redness to the right lower extremity consistent with cellulitis. She states this started a few days ago. 1/24; patient presents for follow-up. Her wounds on the left lower extremity are well-healing with Aquacel and Keystone antibiotic ointment under compression therapy. I prescribed Keflex at last clinic visit for cellulitis of the right lower extremity. Unfortunately this has not resolved. She did not follow-up with her PCP for contact our office about her symptoms. 1/31; patient presents for follow-up. We have been using Aquacel and Keystone antibiotic ointment under compression therapy to the left lower  extremity. Wounds appear well-healing. She has been taking clindamycin for the past week. Her symptoms have improved slightly with a decrease in erythema and warmth to the right lower extremity. She says her pain level has improved. However Symptoms have not completely resolved. She denies fever/chills, nausea/vomiting. 2/7; patient presents for follow-up. We have been using Aquacel Ag with Keystone antibiotic ointment under compression therapy to the left lower extremity. For the past week home health has not been using Keystone antibiotic ointment. She continues to take clindamycin. Her symptoms have improved greatly with the decrease in erythema and warmth to the right lower extremity. She denies systemic signs of infection. She has an area of skin breakdown to the right anterior leg. 2/14; Patient presents for follow-up. She has been using Hydrofera Blue to the right anterior leg under Tubigrip. It looks like Hydrofera Blue is also being used with Keystone to the left lower extremity under compression therapy. Order is for Aqualcell Ag. Overall wounds appear well healing. She has no issues or complaints. 2/21; patient presents for follow-up. We have been using Hydrofera Blue under 3 layer compression to the lower extremities bilaterally. Her right lower extremity wounds have healed. She has a small open wound remaining to her left lateral leg. 2/28; patient presents for follow-up. We have been using Hydrofera Blue under 3 layer compression. Home health has changed the dressing to the left lower extremity however started the wrap at the ankle. 3/6; patient presents for follow-up. We have been using PolyMem with antibiotic ointment under 3 layer compression to the left lower extremity. She has no issues or complaints today. Laura Santiago, Laura Santiago ([id number]) [id number] Page 4 of 13 3/12; patient presents for follow-up. We have been using Aquacel Ag under 3 layer  compression to the left lower extremity. Her wound is healed. She has juxta lite compression wraps at home. Readmission: 03-13-2023 upon evaluation today patient appears to be doing poorly in regard to her lower extremities. Specifically it is the left lower extremity at this point that is causing her problems she has multiple wounds open. With that being said she does not sound like she has been wearing her compression stockings on a regular basis she has previously seen Dr. Mikey Bussing. With that being said upon evaluation today she tells me that since the wounds have reopened that she has been having a lot of discomfort she also tells me that she went back to using some of the dressings that she had leftover at home she does not know exactly what everything was. Nonetheless she also tells me that she has made an appointment with a dermatologist in town and has to see them tomorrow therefore she is not going to allow [place] to wrap her legs today since "that did not work anyway". With that being said obviously she did improve last time we got her wound healed we order her juxta lite compression wraps again it does not sound like she  has been using those appropriately at home. 4/10; patient presents for follow-up. Unfortunately she reopened last week and was seen by [facility]. At that time the patient declined in office wraps. She states she is wearing her juxta lite compression wraps however she did not have these on today. Electronic Signature(s) Signed: 03/21/2023 4:29:14 PM By: Laura Corwin DO Entered By: Laura Santiago on 03/21/2023 15:46:45 -------------------------------------------------------------------------------- Physical Exam Details Patient Name: Date of Service: Laura Muscat NNIE Santiago. 03/21/2023 3:00 PM Medical Record Number: [id number] Patient Account Number: [id number] Date of Birth/Sex: Treating RN: 15-Oct-1946 (77 y.o. Laura Santiago Primary Care Provider: Marisue Santiago Other  Clinician: Referring Provider: Treating Provider/Extender: Laura Santiago in Treatment: 82 Constitutional . Cardiovascular . Psychiatric . Notes Multiple open wounds to the left lower extremity with nonviable tissue. No signs of surrounding infection. 3+ pitting edema to the knee. Electronic Signature(s) Signed: 03/21/2023 4:29:14 PM By: Laura Corwin DO Entered By: Laura Santiago on 03/21/2023 15:47:37 -------------------------------------------------------------------------------- Physician Orders Details Patient Name: Date of Service: Laura Santiago, Laura Santiago NNIE Santiago. 03/21/2023 3:00 PM Medical Record Number: [id number] Patient Account Number: [id number] Date of Birth/Sex: Treating RN: Mar 09, 1946 (77 y.o. Laura Santiago Primary Care Provider: Marisue Santiago Other Clinician: Referring Provider: Treating Provider/Extender: Laura Santiago in Treatment: 52 Verbal / Phone Orders: No Diagnosis Coding Follow-up Appointments Return Appointment in 1 week. Nurse Visit as needed Home Health Home Health Company: [facility] [phone] **Please direct any NON-WOUND related issues/requests for orders to patient's Primary Care Physician. **If current dressing causes regression in wound condition, may D/C ordered dressing product/s and apply Normal Saline Moist Dressing daily until next Wound Healing Center or Other MD appointment. **Notify Wound Healing Center of regression in wound condition at [phone]. Bathing/ Shower/ Hygiene May shower with wound dressing protected with water repellent cover or cast protector. ZAMARIA, BRAZZLE ([id number]) [id number] Page 5 of 13 Edema Control - Lymphedema / Segmental Compressive Device / Other Optional: One layer of unna paste to top of compression wrap (to act as an anchor). Elevate, Exercise Daily and A void Standing for Long Periods of Time. Elevate legs to the  level of the heart and pump ankles as often as possible Elevate leg(s) parallel to the floor when sitting. DO YOUR BEST to sleep in the bed at night. DO NOT sleep in your recliner. Long hours of sitting in a recliner leads to swelling of the legs and/or potential wounds on your backside. Additional Orders / Instructions Follow Nutritious Diet and Increase Protein Intake Wound Treatment Wound #10 - Lower Leg Wound Laterality: Left, Posterior Topical: Gentamicin 3 x Per Week/30 Days Discharge Instructions: in wound clinic only Topical: Mupirocin Ointment 3 x Per Week/30 Days Discharge Instructions: in wound clinic only Prim Dressing: Hydrofera Blue Ready Transfer Foam, 2.5x2.5 (in/in) 3 x Per Week/30 Days ary Discharge Instructions: Apply Hydrofera Blue Ready to wound bed as directed Secondary Dressing: ABD Pad 5x9 (in/in) 3 x Per Week/30 Days Discharge Instructions: Cover with ABD pad Compression Wrap: 3-LAYER WRAP - Profore Lite LF 3 Multilayer Compression Bandaging System 3 x Per Week/30 Days Discharge Instructions: Apply 3 multi-layer wrap as prescribed. Wound #11 - Lower Leg Wound Laterality: Left, Lateral Topical: Gentamicin 3 x Per Week/30 Days Discharge Instructions: in wound clinic only Topical: Mupirocin Ointment 3 x Per Week/30 Days Discharge Instructions: in wound clinic only Prim Dressing: Hydrofera Blue Ready Transfer Foam, 2.5x2.5 (in/in) 3 x Per Week/30 Days ary Discharge Instructions: Apply Hydrofera Blue Ready to  wound bed as directed Secondary Dressing: ABD Pad 5x9 (in/in) 3 x Per Week/30 Days Discharge Instructions: Cover with ABD pad Compression Wrap: 3-LAYER WRAP - Profore Lite LF 3 Multilayer Compression Bandaging System 3 x Per Week/30 Days Discharge Instructions: Apply 3 multi-layer wrap as prescribed. Wound #12 - Ankle Wound Laterality: Left, Medial Topical: Gentamicin 3 x Per Week/30 Days Discharge Instructions: in wound clinic only Topical: Mupirocin  Ointment 3 x Per Week/30 Days Discharge Instructions: in wound clinic only Prim Dressing: Hydrofera Blue Ready Transfer Foam, 2.5x2.5 (in/in) 3 x Per Week/30 Days ary Discharge Instructions: Apply Hydrofera Blue Ready to wound bed as directed Secondary Dressing: ABD Pad 5x9 (in/in) 3 x Per Week/30 Days Discharge Instructions: Cover with ABD pad Compression Wrap: 3-LAYER WRAP - Profore Lite LF 3 Multilayer Compression Bandaging System 3 x Per Week/30 Days Discharge Instructions: Apply 3 multi-layer wrap as prescribed. Laboratory Bacteria identified in Wound by Culture (MICRO) - PCR culture left lower leg, non healing wounds LOINC Code: 6462-6 Convenience Name: Wound culture routine Electronic Signature(s) Signed: 03/21/2023 4:29:14 PM By: Laura Corwin DO Signed: 03/22/2023 4:28:11 PM By: Yevonne Pax RN Entered By: Yevonne Pax on 03/21/2023 16:21:36 Laura Santiago ([id number]) [id number] Page 6 of 13 -------------------------------------------------------------------------------- Problem List Details Patient Name: Date of Service: Laura Santiago. 03/21/2023 3:00 PM Medical Record Number: [id number] Patient Account Number: [id number] Date of Birth/Sex: Treating RN: 03/05/1946 (77 y.o. Laura Santiago Primary Care Provider: Marisue Santiago Other Clinician: Referring Provider: Treating Provider/Extender: Laura Santiago in Treatment: 4 Active Problems ICD-10 Encounter Code Description Active Date MDM Diagnosis 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sed11/23/2022 No Yes 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 11/02/2021 No Yes I87.311 Chronic venous hypertension (idiopathic) with ulcer of right lower extremity 08/30/2022 No Yes I87.2 Venous insufficiency (chronic) (peripheral) 07/06/2021 No Yes Z79.01 Long term (current) use of  anticoagulants 07/06/2021 No Yes I10 Essential (primary) hypertension 07/06/2021 No Yes 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 07/06/2021 No Yes L03.115 Cellulitis of right lower limb 12/27/2022 No Yes S81.801A Unspecified open wound, right lower leg, initial encounter 01/17/2023 No Yes Inactive Problems ICD-10 Code Description Active Date Inactive Date S81.802A Unspecified open wound, left lower leg, initial encounter 07/06/2021 07/06/2021 S91.101A Unspecified open wound of right great toe without damage to nail, initial encounter 08/24/2021 08/24/2021 S91.104A Unspecified open wound of right lesser toe(s) without damage to nail, initial encounter 08/24/2021 08/24/2021 Resolved Problems ICD-10 Code Description Active Date Resolved Date S91.104D Unspecified open wound of right lesser toe(s) without damage to nail, subsequent 08/31/2021 08/31/2021 encounter BRECK, HOLLINGER ([id number]) [id number] Page 7 of 13 S91.201D Unspecified open wound of right great toe with damage to nail, subsequent encounter 08/31/2021 08/31/2021 Electronic Signature(s) Signed: 03/21/2023 4:29:14 PM By: Laura Corwin DO Entered By: Laura Santiago on 03/21/2023 15:45:48 -------------------------------------------------------------------------------- Progress Note Details Patient Name: Date of Service: Laura Muscat NNIE Santiago. 03/21/2023 3:00 PM Medical Record Number: [id number] Patient Account Number: [id number] Date of Birth/Sex: Treating RN: 29-Sep-1946 (77 y.o. Laura Santiago Primary Care Provider: Marisue Santiago Other Clinician: Referring Provider: Treating Provider/Extender: Laura Santiago in Treatment: 32 Subjective Chief Complaint Information obtained from Patient Left lower extremity wound Right toe wounds Left upper lateral thigh wounds History of Present Illness (HPI) Admission 7/27 Ms. Amilya Haver is a 77 year old female with a past medical  history of ADHD, metastatic breast cancer, stage IV chronic kidney disease,  history of DVT on Xarelto and chronic venous insufficiency that presents to the clinic for a chronic left lower extremity wound. She recently moved to [facility] 4 days ago. She was being followed by wound care center in [place]. She reports a 10-year history of wounds to her left lower extremity that eventually do heal with debridement and compression therapy. She states that the current wound reopened 4 months ago and she is using Vaseline and Coban. She denies signs of infection. 8/3; patient presents for 1 week follow-up. She reports no issues or complaints today. She states she had vascular studies done in the last week. She denies signs of infection. She brought her little service dog with her today. 8/17; patient presents for follow-up. She has missed her last clinic appointment. She states she took the wrap off and attempted to rewrap her leg. She is having difficulty with transportation. She has her service dog with her today. Overall she feels well and reports improvement in wound healing. She denies signs of infection. She reports owning an old Velcro wrap compression and has this at her living facility 9/14; patient presents for follow-up. Patient states that over the past 2 to 3 weeks she developed toe wounds to her right foot. She attributes this to tight fitting shoes. She subsequently developed cellulitis in the right leg and has been treated by doxycycline by her oncologist. She reports improvement in symptoms however continues to have some redness and swelling to this leg. T the left lower extremity patient has been having her wraps changed with home health twice weekly. She states that the [facility] is not helping control o the drainage. Other than that she has no issues or complaints today. She denies signs of infection to the left lower extremity. 9/21; patient presents for follow-up. She  reports seeing infectious disease for her cellulitis. She reports no further management. She has home health that changes the wraps twice weekly. She has no issues or complaints today. She denies signs of infection. 10/5; patient presents for follow-up. She has no issues or complaints today. She denies signs of infection. She states that the right great toe has not been dressed by home health. 10/12; patient presents for follow-up. She has no issues or complaints today. She reports improvement in her wound healing. She has been using silver alginate to the right great toe wound. She denies signs of infection. 10/26; patient presents for follow-up. Home health did not have sorbact so they continued to use Hydrofera Blue under the wrap. She has been using silver alginate to the great toe wound however she did not have a dressing in place today. She currently denies signs of infection. 11/2; patient presents for follow-up. She has been using sorb act under the compression wrap. She reports using silver alginate to the toe wound again she does not have a dressing in place. She currently denies signs of infection. 11/23; patient presents for follow-up. Unfortunately she has missed her last 2 clinic appointments. She was last seen 3 weeks ago. She did her own compression wrap with Kerlix and Coban yesterday after seeing vein and vascular. She has not been dressing her right great toe wound. She currently denies signs of infection. 11/30; patient presents for 1 week follow-up. She states she changed her dressing last week prior to home health and use sorb act with Dakin's and Hydrofera Blue. Home health has changed the dressing as well and they have been using sorbact. T oday she reports increased  redness to her right lower extremity. She has a history of cellulitis to this leg. She has been using silver alginate to the right great toe. Unfortunately she had an episode of diarrhea prior to coming in and had  feces all over the right leg and to the wrap of her left leg. 12/7; patient presents for 1 week follow-up. She states that home health did not come out to change the dressing and she took it off yesterday. It is unclear if she is dressing the right toe wound. She denies signs of infection. 12/14; patient presents for 1 week follow-up. She has no issues or complaints today. 12/21; patient presents for follow-up. She has no issues or complaints today. She denies signs of infection. 12/28/2021; patient presents for follow-up. She was hospitalized for sepsis secondary to right lower extremity cellulitis On 12/23. She states she is currently at a SNF. She states that she was started on doxycycline this morning for her right great toe swelling and redness. She is not sure what dressings have been done to her left lower extremity for the past 3 weeks. She says its been mainly gauze with an Ace wrap. 1/25; patient presents for follow-up. She is still residing in a skilled nursing facility. She reports mild pain to the left lower extremity wound bed. She states she is going to see a podiatrist soon. Laura Santiago, Laura Santiago ([id number]) [id number] Page 8 of 13 2/8; patient presents for follow-up. She has moved back to her residential community from her skilled nursing facility. She has no issues or complaints today. She denies signs of systemic infections. 2/15; patient presents for follow-up. He has no issues or complaints today. She denies systemic signs of infection. 2/22; patient presents for follow-up. She has no issues or complaints today. She denies signs of infection. 3/1; patient presents for follow-up. She states that home health came out the day after she was seen in our clinic and yesterday to do the wrap change. She denies signs of infection. She reports excoriated skin on the ankle. 3/8; patient presents for follow-up. She has no issues or complaints today. She denies signs  of infection. 3/15; patient presents for follow-up. Home health has been coming out to change the dressings. She reports more tenderness to the wound site. She denies purulent drainage, increased warmth or erythema to the area. 4/5; patient presents for follow-up. She has missed her last 2 clinic appointments. I have not seen her in 3 weeks. She was recently hospitalized for altered mental status. She was involuntarily committed. She was evaluated by psychiatry and deemed to have competency. There was no specific cause of her altered mental status. It was concluded that her physical and mental health were declining due to her chronic medical conditions. Currently home health has been coming out for dressing changes. Patient has also been doing her own dressing changes. She reports more skin breakdown to the periwound and now has a new wound. She denies fever/chills. She reports continued tenderness to the wound site. 4/12; patient with significant venous insufficiency and a large wound on her left lower leg taking up about 80% of the circumference of her lower leg. Cultures of this grew MRSA and Pseudomonas. She had completed a course of ciprofloxacin now is starting doxycycline. She has been using Dakin's wet-to-dry and a Tubigrip. She has home health twice a week and we change it once. 4/19; patient presents for follow-up. She completed her course of doxycycline. She has been using Dakin's wet-to-dry dressing and  Tubigrip. Home health changes the dressing twice weekly. Currently she has no issues or complaints. 4/26; patient presents for follow-up. At last clinic visit orders for home health were Iodosorb under compression therapy. Unfortunately they did not have the dressing and have been using Dakin's and gentamicin under the wrap. Patient currently denies signs of infection. She has no issues or complaints today. 5/3; patient presents for follow-up. Again Iodosorb has not been used under the  compression therapy when home health comes out to change the wrap and dressing. They have been using Sorbact. It is unclear why this is happening since we send orders weekly to the agency. She denies signs of infection. Patient has not purchased the [facility] antibiotics. We reached out to the company and they said they have been trying to contact her on a regular basis. We gave the patient the number to call to order the medication. 5/10; patient presents for follow-up. She has no issues or complaints today. Again home health has not been using Iodosorb. Mepilex was on the wound bed. No other dressings noted. She brought in her Keystone antibiotics. She denies signs of infection. 5/17; patient presents for follow-up. Home health has come out twice since she was last seen. Joint well she has been using Keystone antibiotic with Sorbact under the compression wrap. She has no issues or complaints today. She denies signs of infection. 5/24; patient presents for follow-up. We have been using Keystone antibiotics with Sorbact under compression therapy. She is tolerating the treatment well. She is reporting improvement in wound healing. She denies signs of infection. 5/31; patient presents for follow-up. We continue to do [facility] antibiotics with Sorbact under compression therapy. She continues to report improvement in wound healing. Home health comes out and changes the dressing once weekly. 05-17-2022 upon evaluation today patient appears to be doing better in regard to her wound especially compared to the last time I saw her. Fortunately I do think that she is seeing improvements. With that being said I do believe that she may be benefit from sharp debridement today to clear away some of the necrotic debris I discussed that with her as well. She is an amendable to that plan. Otherwise she is very pleased with how the [facility] Echevaria is doing for her. 6/14; patient presents for follow-up. We have been using  Keystone antibiotic with Sorbact and absorbent dressings under 3 layer compression. She has no issues or complaints today. She reports improvement in wound healing. She denies signs of infection. 6/21; patient presents for follow-up. We are continuing with [facility] antibiotic and Sorbact under 3 layer compression. Patient has no complaints. Continued wound healing is happening. She denies signs of infection. 6/28; patient presents for follow-up. We have been using Keystone antibiotic with Sorbact under 3 layer compression. Usually home health comes out and changes the dressing twice a week. Unfortunately they did not go out to change the dressing. It is unclear why. Patient did not call them. She currently denies signs of infection. 7/5; patient presents for follow-up. We have been using Keystone antibiotic with calcium alginate under 3 layer compression. She reports improvement in wound healing. She denies signs of infection. Home health has come out to do dressing changes twice this past week. 7/12; patient presents for follow-up. We have been using Keystone antibiotic with calcium alginate under 3 layer compression. Patient states that home health came out once last week to change the dressing. She reports improvement in wound healing. She currently denies signs of infection. 7/19;  patient presents for follow-up. We have been using Keystone antibiotic with calcium alginate under 3 layer compression. Home health came out once last week to change the dressing. She has no issues or complaints today. She denies signs of infection. 8/2; patient presents for follow-up. We have been using Keystone antibiotic with calcium alginate under 3 layer compression. Unfortunately she missed her appointment last week and home health did not come out to do dressing changes. Patient currently denies signs of infection. 8/9; patient presents for follow-up. We have been using Keystone with calcium alginate under 3 layer  compression. She states that home health came out once last week. She currently denies signs of infection. Her wrap was completely wet. She states she was cleaning the top of the leg and water soaked down into the wrap. 8/16; patient presents for follow-up. We have been using Keystone with calcium alginate under 3 layer compression. She states that home health came out twice last week. She has no issues or complaints today. 8/23; patient presents for follow-up. He has been using Keystone with calcium alginate under 3 layer compression. Home health came out twice last week. She denies signs of infection. 8/30; patient presents for follow-up. We have been using Keystone with calcium alginate under 3 layer compression. Home health came out once last week to change the dressing. Patient reports improvement in wound healing. She states she is almost done with her chemotherapy infusions and has 1 more left. 9/13; patient presents for follow-up. She has lost the capsules to her [facility] antibiotic which I believe is the vancomycin pills. She has her Zosyn powder today. We have been using Keystone antibiotic ointment with calcium alginate under 3 layer compression. She is concerned about systemic infection however her vitals are stable and there is no surrounding soft tissue infection. She would like to remain a patient in our wound care center however would like a second opinion for her wound care at another facility. She asked to be referred to [facility] wound care center. MYKA, HITZ ([id number]) [id number] Page 9 of 13 9/20; patient presents for follow-up. She found her vancomycin capsules and brought in her complete Keystone antibiotic ointment set today. Unfortunately she has developed skin breakdown and Erythema to the right lower extremity With increased swelling. She states she went to a pow wow Over the weekend and was on her feet for extended periods of time. She saw  her oncologist yesterday who prescribed her doxycycline for her right lower extremity erythema. 9/27; patient presents for follow-up. We have been using Keystone antibiotic with Aquacel under 3 layer compression to the lower extremities bilaterally. When home health came and changed the wrap she secretly put coffee into the spray mix along with [facility] antibiotic on her leg thinking the acidic component would better activate the zoysn (sonething she discussed with her microbiologist brother). She has reported improvement in wound healing. 10/4; patient presents for follow-up. She has no issues or complaints today. We have been doing Aquacel and keystone under 3 layer compression to the lower extremities bilaterally. This morning she took the right lower extremity wrap off as it was uncomfortable. She has no open wounds to this leg. 10/11; patient presents for follow-up. We have been doing Aquacel with Keystone antibiotic ointment under 3 layer compression to the left lower extremity. She developed a small blister to the anterior aspect of the left leg noticed when the wrap was taken off on intake. She currently denies signs of infection. 10/18; patient presents for  follow-up. We have been doing Aquacel with Keystone antibiotic ointment under 3 layer compression to the left lower extremity. There has been continued improvement in wound healing. She denies signs of infection. 10/25; patient arrives for treatment of venous insufficiency ulcers on her left lower leg both lateral and medial are remanence of apparently a circumferential wound. Much improved. We are using topicals Keystone and Aquacel Ag under 3 layer compression we continue to make good progress. The patient talk to me at some length with regards to different things she has on her forehead and her Peri orbital area for which she is apparently applying [place]. She feels that what ever we are treating on her wounds is a more systemic  problem. I really was not able to get a handle on what she is talking about however I did caution her not to put the [place] in her eyes. 11/1; her wounds continue to improve she is using Keystone and Aquacel Ag G under 3 layer compression. Our intake nurse notes erythema and edema in the right leg. The patient has a litany of concerns with regards to a rash on her forehead or ears and other systemic complaints. She has an appointment with dermatology on November 11 11/8; patient presents for follow-up. We have been using Keystone and Aquacel under 4-layer compression. She has no issues or complaints today. She reports improvement in wound healing. 11/15; patient presents for follow-up. We have been using Aquacel with Keystone antibiotic under 3 layer compression. Patient continues improvement in wound healing. 12/6; patient presents for follow-up. We have been using Aquacel with Keystone antibiotic ointment under 3 layer compression. Wounds appear well-healing. 12/13; patient presents for follow-up. We have been using Aquacel with Keystone antibiotic under 3 layer compression. She has no issues or complaints today. 12/27 left lateral medial ankle. Superficial wounds remain there is significantly improved we are using Keystone backed with Zetuvit under 4-layer compression 1/3; patient presents for follow-up. Her wounds appear well-healing. We have been using Aquacel Ag with Keystone antibiotic ointment under compression therapy. This should be a 3 layer compression. 1/17; patient presents for follow-up. Her wounds on her left lower extremity are well-healing. We are using Aquacel Ag and Keystone antibiotic ointment under compression therapy. She missed her last clinic appointment. She states that the wrap has been changed twice weekly since she was last seen. Unfortunately she has developed increased warmth and redness to the right lower extremity consistent with cellulitis. She states this started  a few days ago. 1/24; patient presents for follow-up. Her wounds on the left lower extremity are well-healing with Aquacel and Keystone antibiotic ointment under compression therapy. I prescribed Keflex at last clinic visit for cellulitis of the right lower extremity. Unfortunately this has not resolved. She did not follow-up with her PCP for contact our office about her symptoms. 1/31; patient presents for follow-up. We have been using Aquacel and Keystone antibiotic ointment under compression therapy to the left lower extremity. Wounds appear well-healing. She has been taking clindamycin for the past week. Her symptoms have improved slightly with a decrease in erythema and warmth to the right lower extremity. She says her pain level has improved. However Symptoms have not completely resolved. She denies fever/chills, nausea/vomiting. 2/7; patient presents for follow-up. We have been using Aquacel Ag with Keystone antibiotic ointment under compression therapy to the left lower extremity. For the past week home health has not been using Keystone antibiotic ointment. She continues to take clindamycin. Her symptoms have improved greatly  with the decrease in erythema and warmth to the right lower extremity. She denies systemic signs of infection. She has an area of skin breakdown to the right anterior leg. 2/14; Patient presents for follow-up. She has been using Hydrofera Blue to the right anterior leg under Tubigrip. It looks like Hydrofera Blue is also being used with Keystone to the left lower extremity under compression therapy. Order is for Aqualcell Ag. Overall wounds appear well healing. She has no issues or complaints. 2/21; patient presents for follow-up. We have been using Hydrofera Blue under 3 layer compression to the lower extremities bilaterally. Her right lower extremity wounds have healed. She has a small open wound remaining to her left lateral leg. 2/28; patient presents for  follow-up. We have been using Hydrofera Blue under 3 layer compression. Home health has changed the dressing to the left lower extremity however started the wrap at the ankle. 3/6; patient presents for follow-up. We have been using PolyMem with antibiotic ointment under 3 layer compression to the left lower extremity. She has no issues or complaints today. 3/12; patient presents for follow-up. We have been using Aquacel Ag under 3 layer compression to the left lower extremity. Her wound is healed. She has juxta lite compression wraps at home. Readmission: 03-13-2023 upon evaluation today patient appears to be doing poorly in regard to her lower extremities. Specifically it is the left lower extremity at this point that is causing her problems she has multiple wounds open. With that being said she does not sound like she has been wearing her compression stockings on a regular basis she has previously seen Dr. Mikey Bussing. With that being said upon evaluation today she tells me that since the wounds have reopened that she has been having a lot of discomfort she also tells me that she went back to using some of the dressings that she had leftover at home she does not know exactly what everything was. Nonetheless she also tells me that she has made an appointment with a dermatologist in town and has to see them tomorrow therefore she is not going to allow [place] to wrap her legs today since "that did not work anyway". With that being said obviously she did improve last time we got her wound healed we order her juxta lite compression wraps again it does not sound like she has been using those appropriately at home. 4/10; patient presents for follow-up. Unfortunately she reopened last week and was seen by [facility]. At that time the patient declined in office wraps. She states she is wearing her juxta lite compression wraps however she did not have these on today. Laura Santiago, Laura Santiago ([id number])  [id number] Page 10 of 13 Objective Constitutional Vitals Time Taken: 3:11 PM, Height: 66 in, Weight: 153 lbs, BMI: 24.7, Temperature: 98.3 F, Pulse: 74 bpm, Respiratory Rate: 18 breaths/min, Blood Pressure: 122/65 mmHg. General Notes: Multiple open wounds to the left lower extremity with nonviable tissue. No signs of surrounding infection. 3+ pitting edema to the knee. Integumentary (Hair, Skin) Wound #10 status is Open. Original cause of wound was Gradually Appeared. The date acquired was: 03/04/2023. The wound has been in treatment 1 weeks. The wound is located on the Left,Posterior Lower Leg. The wound measures 7cm length x 9.5cm width x 0.1cm depth; 52.229cm^2 area and 5.223cm^3 volume. There is Fat Layer (Subcutaneous Tissue) exposed. There is no tunneling or undermining noted. There is a medium amount of serosanguineous drainage noted. There is small (1-33%) granulation within the wound bed.  There is a large (67-100%) amount of necrotic tissue within the wound bed including Adherent Slough. Wound #11 status is Open. Original cause of wound was Gradually Appeared. The date acquired was: 03/04/2023. The wound has been in treatment 1 weeks. The wound is located on the Left,Lateral Lower Leg. The wound measures 2cm length x 1.3cm width x 0.1cm depth; 2.042cm^2 area and 0.204cm^3 volume. There is Fat Layer (Subcutaneous Tissue) exposed. There is no tunneling or undermining noted. There is a medium amount of serosanguineous drainage noted. There is small (1-33%) red granulation within the wound bed. There is a large (67-100%) amount of necrotic tissue within the wound bed including Adherent Slough. Wound #12 status is Open. Original cause of wound was Gradually Appeared. The date acquired was: 03/04/2023. The wound has been in treatment 1 weeks. The wound is located on the Left,Medial Ankle. The wound measures 2.5cm length x 5cm width x 0.1cm depth; 9.817cm^2 area and  0.982cm^3 volume. There is Fat Layer (Subcutaneous Tissue) exposed. There is no tunneling or undermining noted. There is a medium amount of serosanguineous drainage noted. There is small (1-33%) pink granulation within the wound bed. There is a large (67-100%) amount of necrotic tissue within the wound bed including Adherent Slough. 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 Her edema is not controlled. I recommended an office wraps and she was agreeable to this. Also obtained a PCR culture as the wounds appear to be riddled with bioburden. She has done well with [facility] antibiotic ointment in the past. For now I recommended antibiotic ointment with Hydrofera Blue under 3 layer compression. She has home health that changes the wrap. Procedures Wound #10 Pre-procedure diagnosis of Wound #10 is a Venous Leg Ulcer located on the Left,Posterior Lower Leg . There was a Three Layer Compression Therapy Procedure by Yevonne Pax, RN. Post procedure Diagnosis Wound #10: Same as Pre-Procedure Wound #11 Pre-procedure diagnosis of Wound #11 is a Venous Leg Ulcer located on the Left,Lateral Lower Leg . There was a Three Layer Compression Therapy Procedure by Yevonne Pax, RN. Post procedure Diagnosis Wound #11: Same as Pre-Procedure Wound #12 Pre-procedure diagnosis of Wound #12 is a Venous Leg Ulcer located on the Left,Medial Ankle . There  was a Three Layer Compression Therapy Procedure by Yevonne Pax, RN. Post procedure Diagnosis Wound #12: Same as Pre-Procedure Plan Laura Santiago, Laura Santiago ([id number]) [id number] Page 11 of 13 Follow-up Appointments: Return Appointment in 1 week. Nurse Visit as needed Home Health: Home Health Company: [facility] [phone] **Please direct any NON-WOUND related issues/requests for orders to patient's Primary Care Physician. **If current dressing causes regression in wound condition, may D/C ordered dressing product/s and apply Normal Saline Moist Dressing daily until next Wound Healing Center or Other MD appointment. **Notify Wound Healing Center of regression in wound condition at [phone]. Bathing/ Shower/ Hygiene: May shower with wound dressing protected with water repellent cover or cast protector. Edema Control - Lymphedema / Segmental Compressive Device / Other: Optional: One layer of unna paste to top of compression wrap (to act as an  anchor). Elevate, Exercise Daily and Avoid Standing for Long Periods of Time. Elevate legs to the level of the heart and pump ankles as often as possible Elevate leg(s) parallel to the floor when sitting. DO YOUR BEST to sleep in the bed at night. DO NOT sleep in your recliner. Long hours of sitting in a recliner leads to swelling of the legs and/or potential wounds on your backside. Additional Orders / Instructions: Follow Nutritious Diet and Increase Protein Intake WOUND #10: - Lower Leg Wound Laterality: Left, Posterior Topical: Gentamicin 3 x Per Week/30 Days Discharge Instructions: Apply as directed by provider. Topical: Mupirocin Ointment 3 x Per Week/30 Days Discharge Instructions: Apply as directed by provider. Prim Dressing: Hydrofera Blue Ready Transfer Foam, 2.5x2.5 (in/in) 3 x Per Week/30 Days ary Discharge Instructions: Apply Hydrofera Blue Ready to wound bed as directed Secondary Dressing: ABD Pad 5x9  (in/in) 3 x Per Week/30 Days Discharge Instructions: Cover with ABD pad Com pression Wrap: 3-LAYER WRAP - Profore Lite LF 3 Multilayer Compression Bandaging System 3 x Per Week/30 Days Discharge Instructions: Apply 3 multi-layer wrap as prescribed. WOUND #11: - Lower Leg Wound Laterality: Left, Lateral Topical: Gentamicin 3 x Per Week/30 Days Discharge Instructions: Apply as directed by provider. Topical: Mupirocin Ointment 3 x Per Week/30 Days Discharge Instructions: Apply as directed by provider. Prim Dressing: Hydrofera Blue Ready Transfer Foam, 2.5x2.5 (in/in) 3 x Per Week/30 Days ary Discharge Instructions: Apply Hydrofera Blue Ready to wound bed as directed Secondary Dressing: ABD Pad 5x9 (in/in) 3 x Per Week/30 Days Discharge Instructions: Cover with ABD pad Com pression Wrap: 3-LAYER WRAP - Profore Lite LF 3 Multilayer Compression Bandaging System 3 x Per Week/30 Days Discharge Instructions: Apply 3 multi-layer wrap as prescribed. WOUND #12: - Ankle Wound Laterality: Left, Medial Topical: Gentamicin 3 x Per Week/30 Days Discharge Instructions: Apply as directed by provider. Topical: Mupirocin Ointment 3 x Per Week/30 Days Discharge Instructions: Apply as directed by provider. Prim Dressing: Hydrofera Blue Ready Transfer Foam, 2.5x2.5 (in/in) 3 x Per Week/30 Days ary Discharge Instructions: Apply Hydrofera Blue Ready to wound bed as directed Secondary Dressing: ABD Pad 5x9 (in/in) 3 x Per Week/30 Days Discharge Instructions: Cover with ABD pad Com pression Wrap: 3-LAYER WRAP - Profore Lite LF 3 Multilayer Compression Bandaging System 3 x Per Week/30 Days Discharge Instructions: Apply 3 multi-layer wrap as prescribed. 1. Antibiotic ointment with Hydrofera Blue under 3 layer compressionooleft lower extremity 2. Follow-up in 1 week 3. PCR wound culture Electronic Signature(s) Signed: 03/21/2023 4:29:14 PM By: Laura Corwin DO Entered By: Laura Santiago on 03/21/2023  15:49:16 -------------------------------------------------------------------------------- ROS/PFSH Details Patient Name: Date of Service: Laura Santiago, Laura NNIE Santiago. 03/21/2023 3:00 PM Medical Record Number: [id number] Patient Account Number: [id number] Date of Birth/Sex: Treating RN: May 14, 1946 (77 y.o. Laura Santiago Primary Care Provider: Marisue Santiago Other Clinician: Referring Provider: Treating Provider/Extender: Laura Santiago in Treatment: 46 Information Obtained From Patient Eyes Medical History: Negative for: Cataracts; Glaucoma; Optic Neuritis Laura Santiago, KAMA ([id number]) [id number] Page 12 of 13 Ear/Nose/Mouth/Throat Medical History: Negative for: Chronic sinus problems/congestion; Middle ear problems Hematologic/Lymphatic Medical History: Negative for: Anemia; Hemophilia; Human Immunodeficiency Virus; Lymphedema; Sickle Cell Disease Respiratory Medical History: Negative for: Aspiration; Asthma; Chronic Obstructive Pulmonary Disease (COPD); Pneumothorax; Sleep Apnea; Tuberculosis Cardiovascular Medical History: Positive for: Hypertension Negative for: Angina; Arrhythmia; Congestive Heart Failure; Coronary Artery Disease; Deep Vein Thrombosis; Hypotension; Myocardial Infarction; Peripheral Arterial Disease; Peripheral Venous Disease; Phlebitis; Vasculitis Gastrointestinal Medical History: Negative for: Cirrhosis ; Colitis; Crohns;  Hepatitis A; Hepatitis B; Hepatitis C Endocrine Medical History: Negative for: Type I Diabetes; Type II Diabetes Genitourinary Medical History: Negative for: End Stage Renal Disease Immunological Medical History: Negative for: Lupus Erythematosus; Raynauds; Scleroderma Integumentary (Skin) Medical History: Negative for: History of Burn; History of pressure wounds Musculoskeletal Medical History: Positive for: Osteoarthritis Negative for: Gout; Rheumatoid Arthritis;  Osteomyelitis Oncologic Medical History: Positive for: Received Chemotherapy; Received Radiation Past Medical History Notes: breast cancer Immunizations Pneumococcal Vaccine: Received Pneumococcal Vaccination: No Implantable Devices None Family and Social History Never smoker Electronic Signature(s) Signed: 03/21/2023 4:29:14 PM By: Laura CorwinHoffman, Wavie Hashimi DO Signed: 03/22/2023 4:28:11 PM By: Yevonne PaxEpps, Carrie RN Entered By: Laura CorwinHoffman, Latangela Mccomas on 03/21/2023 15:50:00 Laura DraftLAMBERT, Laura Santiago ([id number]) [id number] Page 13 of 13 -------------------------------------------------------------------------------- SuperBill Details Patient Name: Date of Service: Laura LandryLA Santiago, Laura NNIE Santiago. 03/21/2023 Medical Record Number: [id number] Patient Account Number: [id number] Date of Birth/Sex: Treating RN: 09/18/1946 (77 y.o. Laura FinnerF) Epps, Carrie Primary Care Provider: Marisue IvanLinthavong, Kanhka Other Clinician: Referring Provider: Treating Provider/Extender: Laura DoomHoffman, Makaylin Carlo Linthavong, Kanhka Weeks in Treatment: 1089 Diagnosis Coding ICD-10 Codes Code Description 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 I87.311 Chronic venous hypertension (idiopathic) with ulcer of right lower extremity I87.2 Venous insufficiency (chronic) (peripheral) Z79.01 Long term (current) use of anticoagulants I10 Essential (primary) hypertension 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 Cellulitis of right lower limb S81.801A Unspecified open wound, right lower leg, initial encounter Facility Procedures : CPT4 Code: [id number] Description: (Facility Use Only) 29581LT - APPLY MULTLAY COMPRS LWR LT LEG Modifier: Quantity: 1 Physician Procedures : CPT4 Code Description Modifier [id number] - WC PHYS LEVEL 3 - EST PT ICD-10 Diagnosis Description L97.822 Non-pressure chronic ulcer of  other part of left lower leg with fat layer exposed I87.312 Chronic venous hypertension (idiopathic) with ulcer  of left lower extremity Quantity: 1 Electronic Signature(s) Signed: 03/21/2023 4:29:14 PM By: Laura CorwinHoffman, Riane Rung DO Entered By: Laura CorwinHoffman, Daquisha Clermont on 03/21/2023 15:49:39

## 2023-03-26 ENCOUNTER — Other Ambulatory Visit: Payer: Self-pay | Admitting: Hospice and Palliative Medicine

## 2023-03-27 ENCOUNTER — Other Ambulatory Visit: Payer: Self-pay | Admitting: Oncology

## 2023-03-27 ENCOUNTER — Encounter: Payer: Self-pay | Admitting: Oncology

## 2023-03-27 MED ORDER — OXYCODONE HCL 10 MG PO TABS: 15.0000 mg | ORAL_TABLET | Freq: Four times a day (QID) | ORAL | 0 refills | Status: DC | PRN

## 2023-03-27 NOTE — Telephone Encounter (Signed)
Patient having mychart visit 5/3

## 2023-03-28 ENCOUNTER — Encounter (HOSPITAL_BASED_OUTPATIENT_CLINIC_OR_DEPARTMENT_OTHER): Payer: Medicare Other | Admitting: Internal Medicine

## 2023-03-28 ENCOUNTER — Ambulatory Visit: Payer: Medicare Other | Attending: Cardiology

## 2023-03-28 DIAGNOSIS — T451X5D Adverse effect of antineoplastic and immunosuppressive drugs, subsequent encounter: Secondary | ICD-10-CM

## 2023-03-28 DIAGNOSIS — I427 Cardiomyopathy due to drug and external agent: Secondary | ICD-10-CM | POA: Insufficient documentation

## 2023-03-28 DIAGNOSIS — T451X5A Adverse effect of antineoplastic and immunosuppressive drugs, initial encounter: Secondary | ICD-10-CM | POA: Diagnosis present

## 2023-03-28 DIAGNOSIS — I87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: Secondary | ICD-10-CM

## 2023-03-28 DIAGNOSIS — L97822 Non-pressure chronic ulcer of other part of left lower leg with fat layer exposed: Secondary | ICD-10-CM

## 2023-03-28 LAB — ECHOCARDIOGRAM COMPLETE
AR max vel: 2.54 cm2
AV Area VTI: 2.61 cm2
AV Area mean vel: 2.56 cm2
AV Mean grad: 7 mmHg
AV Peak grad: 13.4 mmHg
Ao pk vel: 1.83 m/s
Area-P 1/2: 3.42 cm2
Calc EF: 57.9 %
S' Lateral: 3 cm
Single Plane A2C EF: 58.8 %
Single Plane A4C EF: 56.7 %

## 2023-03-28 NOTE — Progress Notes (Signed)
TASHEMA, TILLER ([id number]) [id number] Page 1 of 9 Visit Report for 03/28/2023 Arrival Information Details Patient Name: Date of Service: Laura Santiago. 03/28/2023 9:15 A M Medical Record Number: [id number] Patient Account Number: [id number] Date of Birth/Sex: Treating RN: 1946/02/04 (77 y.o. Starleen Arms, Leah Primary Care Yurika Pereda: Marisue Ivan Other Clinician: Referring Tacari Repass: Treating Marisol Glazer/Extender: Greta Doom in Treatment: 77 Visit Information History Since Last Visit Has Dressing in Place as Prescribed: Yes Patient Arrived: Walker Has Compression in Place as Prescribed: Yes Arrival Time: 09:20 Pain Present Now: No Accompanied By: service dog Transfer Assistance: None Patient Identification Verified: Yes Secondary Verification Process Completed: Yes Patient Requires Transmission-Based No Precautions: Patient Has Alerts: Yes Patient Alerts: PT HAS SERVICE ANIMAL ABI 07/11/21 R) 1.16 L) 1.27 Electronic Signature(s) Signed: 03/28/2023 2:56:04 PM By: Bonnell Public Entered By: Bonnell Public on 03/28/2023 09:21:07 -------------------------------------------------------------------------------- Encounter Discharge Information Details Patient Name: Date of Service: Laura Muscat [name] 03/28/2023 9:15 A M Medical Record Number: [id number] Patient Account Number: [id number] Date of Birth/Sex: Treating RN: 1946/08/17 (77 y.o. Starleen Arms, Leah Primary Care Juanluis Guastella: Marisue Ivan Other Clinician: Referring Heman Que: Treating Sharada Albornoz/Extender: Greta Doom in Treatment: 45 Encounter Discharge Information Items Post Procedure Vitals Discharge Condition: Stable Temperature (F): 97.9 Ambulatory Status: Walker Pulse (bpm): 56 Discharge Destination: Home Respiratory Rate (breaths/min): 18 Accompanied By: service dog Blood Pressure (mmHg): 129/69 Schedule Follow-up  Appointment: Yes Clinical Summary of Care: Electronic Signature(s) Signed: 03/28/2023 2:56:04 PM By: Bonnell Public Entered By: Bonnell Public on 03/28/2023 10:15:36 -------------------------------------------------------------------------------- Lower Extremity Assessment Details Patient Name: Date of Service: Laura Santiago. 03/28/2023 9:15 A M Medical Record Number: [id number] Patient Account Number: [id number] Date of Birth/Sex: Treating RN: August 27, 1946 (77 y.o. Starleen Arms, Leah Primary Care Amaani Guilbault: Marisue Ivan Other Clinician: Referring Maleik Vanderzee: Treating Travarus Trudo/Extender: Claudie Fisherman Weeks in Treatment: 90 Edema Assessment Assessed: [name]: No] Franne Forts: No] [Left: Edema] Franne Forts: :] Brunetta JeansClide Dales ([id number])] [Right: [id number] Page 2 of 9] Calf Left: Right: Point of Measurement: 36 cm From Medial Instep 40 cm Ankle Left: Right: Point of Measurement: 12 cm From Medial Instep 23 cm Vascular Assessment Pulses: Dorsalis Pedis Palpable: [Left:Yes] Posterior Tibial Palpable: [Left:Yes] Electronic Signature(s) Signed: 03/28/2023 2:56:04 PM By: Bonnell Public Entered By: Bonnell Public on 03/28/2023 09:48:18 -------------------------------------------------------------------------------- Multi Wound Chart Details Patient Name: Date of Service: Laura Muscat [name] 03/28/2023 9:15 A M Medical Record Number: [id number] Patient Account Number: [id number] Date of Birth/Sex: Treating RN: 1946/04/25 (77 y.o. Starleen Arms, Leah Primary Care Harshan Kearley: Marisue Ivan Other Clinician: Referring Audrey Thull: Treating Wrenly Lauritsen/Extender: Greta Doom in Treatment: 57 Vital Signs Height(in): 66 Pulse(bpm): 56 Weight(lbs): 153 Blood Pressure(mmHg): 129/69 Body Mass Index(BMI): 24.7 Temperature(F): 97.9 Respiratory Rate(breaths/min): 18 [10:Photos:] Left, Posterior Lower Leg Left,  Lateral Lower Leg Left, Medial Ankle Wound Location: Gradually Appeared Gradually Appeared Gradually Appeared Wounding Event: Venous Leg Ulcer Venous Leg Ulcer Venous Leg Ulcer Primary Etiology: Hypertension, Osteoarthritis, ReceivedHypertension, Osteoarthritis, ReceivedHypertension, Osteoarthritis, Received Comorbid History: Chemotherapy, Received Radiation Chemotherapy, Received Radiation Chemotherapy, Received Radiation 03/04/2023 03/04/2023 03/04/2023 Date Acquired: Weeks of Treatment: Open Open Open Wound Status: No No No Wound Recurrence: Yes No Yes Clustered Wound: 9x9x0.5 1.6x1x0.2 3x5x0.2 Measurements L x W x D (cm) 63.617 1.257 11.781 A (cm) : rea 31.809 0.251 2.356 Volume (cm) : -6.60% 5.80% -290.60% % Reduction in Area: -432.90% -87.30% -680.10% % Reduction in Volume: Full Thickness Without Exposed Full Thickness Without Exposed Full  Thickness Without Exposed Classification: Support Structures Support Structures Support Structures Medium Small Medium Exudate Amount: Serosanguineous Serosanguineous Serosanguineous Exudate Type: red, brown red, brown red, brown Exudate Color: Distinct, outline attached Distinct, outline attached Distinct, outline attached Wound Margin: Small (1-33%) Large (67-100%) Small (1-33%) Granulation Amount: Red Red Pink Granulation Quality: Large (67-100%) Small (1-33%) Large (67-100%) Necrotic Amount: Laura Santiago ([id number]) [id number] Page 3 of 9 Fat Layer (Subcutaneous Tissue): Yes Fat Layer (Subcutaneous Tissue): Yes Fat Layer (Subcutaneous Tissue): Yes Exposed Structures: Fascia: No Fascia: No Fascia: No Tendon: No Tendon: No Tendon: No Muscle: No Muscle: No Muscle: No Joint: No Joint: No Joint: No Bone: No Bone: No Bone: No N/A None None Epithelialization: Treatment Notes Electronic Signature(s) Signed: 03/28/2023 2:56:04 PM By: Bonnell Public Entered By: Bonnell Public on  03/28/2023 09:48:24 -------------------------------------------------------------------------------- Multi-Disciplinary Care Plan Details Patient Name: Date of Service: Laura Muscat [name] 03/28/2023 9:15 A M Medical Record Number: [id number] Patient Account Number: [id number] Date of Birth/Sex: Treating RN: October 05, 1946 (77 y.o. Starleen Arms, Leah Primary Care Benjimen Kelley: Marisue Ivan Other Clinician: Referring Lydiann Bonifas: Treating Taha Dimond/Extender: Greta Doom in Treatment: 19 Active Inactive Necrotic Tissue Nursing Diagnoses: Impaired tissue integrity related to necrotic/devitalized tissue Knowledge deficit related to management of necrotic/devitalized tissue Goals: Necrotic/devitalized tissue will be minimized in the wound bed Date Initiated: 03/13/2023 Target Resolution Date: 04/27/2023 Goal Status: Active Patient/caregiver will verbalize understanding of reason and process for debridement of necrotic tissue Date Initiated: 03/13/2023 Target Resolution Date: 04/27/2023 Goal Status: Active Interventions: Assess patient pain level pre-, during and post procedure and prior to discharge Provide education on necrotic tissue and debridement process Treatment Activities: Apply topical anesthetic as ordered : 07/06/2021 Biologic debridement : 07/06/2021 Enzymatic debridement : 07/06/2021 Excisional debridement : 07/06/2021 Notes: Wound/Skin Impairment Nursing Diagnoses: Impaired tissue integrity Goals: Patient/caregiver will verbalize understanding of skin care regimen Date Initiated: 03/13/2023 Target Resolution Date: 04/27/2023 Goal Status: Active Ulcer/skin breakdown will have a volume reduction of 30% by week 4 Date Initiated: 03/13/2023 Target Resolution Date: 04/12/2023 Goal Status: Active Ulcer/skin breakdown will have a volume reduction of 50% by week 8 Date Initiated: 03/13/2023 Target Resolution Date: 03/13/2023 Goal Status: Active Ulcer/skin breakdown  will have a volume reduction of 80% by week 12 Date Initiated: 03/13/2023 Target Resolution Date: 06/12/2023 Goal Status: Active Ulcer/skin breakdown will heal within 14 weeks MAKENAH, KARAS ([id number]) [id number] Page 4 of 9 Date Initiated: 03/13/2023 Target Resolution Date: 06/26/2023 Goal Status: Active Interventions: Assess patient/caregiver ability to obtain necessary supplies Assess patient/caregiver ability to perform ulcer/skin care regimen upon admission and as needed Assess ulceration(s) every visit Treatment Activities: Referred to DME Sherena Machorro for dressing supplies : 07/06/2021 Skin care regimen initiated : 07/06/2021 Notes: Electronic Signature(s) Signed: 03/28/2023 2:56:04 PM By: Bonnell Public Entered By: Bonnell Public on 03/28/2023 14:52:44 -------------------------------------------------------------------------------- Pain Assessment Details Patient Name: Date of Service: Laura Franco M. 03/28/2023 9:15 A M Medical Record Number: [id number] Patient Account Number: [id number] Date of Birth/Sex: Treating RN: May 03, 1946 (77 y.o. Starleen Arms, Leah Primary Care Tichina Koebel: Marisue Ivan Other Clinician: Referring Jamon Hayhurst: Treating Orvetta Danielski/Extender: Greta Doom in Treatment: 79 Active Problems Location of Pain Severity and Description of Pain Patient Has Paino No Site Locations Pain Management and Medication Current Pain Management: Electronic Signature(s) Signed: 03/28/2023 2:56:04 PM By: Bonnell Public Entered By: Bonnell Public on 03/28/2023 09:23:51 -------------------------------------------------------------------------------- Patient/Caregiver Education Details Patient Name: Date of Service: Laura Muscat [name] 4/17/2024andnbsp9:15 A M Medical Record Number: [id number] Patient Account  Number: [id number] Date of Birth/Gender: Treating RN: [birth year] (77 y.o. Starleen Arms, Leah Primary Care Physician:  Marisue Ivan Other Clinician: Referring Physician: Treating Physician/Extender: Greta Doom in Treatment: [address] ARISBETH, DAUBE [facility] ([id number]) [id number] Page 5 of 9 Education Provided To: Patient Education Topics Provided Medication Safety: Handouts: Medication Safety, Other: contact [facility]stone Methods: Explain/Verbal Responses: State content correctly Tissue Oxygenation: Handouts: Other: referral to [facility] Methods: Explain/Verbal Responses: State content correctly Wound Debridement: Handouts: Wound Debridement Methods: Explain/Verbal Responses: State content correctly Wound/Skin Impairment: Handouts: Caring for Your Ulcer Methods: Explain/Verbal Responses: State content correctly Electronic Signature(s) Signed: 03/28/2023 2:56:04 PM By: Bonnell Public Entered By: Bonnell Public on 03/28/2023 10:13:13 -------------------------------------------------------------------------------- Wound Assessment Details Patient Name: Date of Service: Laura Muscat [name] 03/28/2023 9:15 A M Medical Record Number: [id number] Patient Account Number: [id number] Date of Birth/Sex: Treating RN: 06/12/1946 (77 y.o. Starleen Arms, Leah Primary Care Emmett Bracknell: Marisue Ivan Other Clinician: Referring Earla Charlie: Treating Darrel Baroni/Extender: Greta Doom in Treatment: 90 Wound Status Wound Number: 10 Primary Venous Leg Ulcer Etiology: Wound Location: Left, Posterior Lower Leg Wound Status: Open Wounding Event: Gradually Appeared Comorbid Hypertension, Osteoarthritis, Received Chemotherapy, Date Acquired: 03/04/2023 History: Received Radiation Weeks Of Treatment: 2 Clustered Wound: Yes Photos Wound Measurements Length: (cm) 9 Width: (cm) 9 Depth: (cm) 0.5 Area: (cm) 63.617 Volume: (cm) 31.809 % Reduction in Area: -6.6% % Reduction in Volume: -432.9% Wound Description SHANTEE, KAMPMAN ([id number]) Classification: Full Thickness Without Exposed Support Structures Wound Margin: Distinct, outline attached Exudate Amount: Medium Exudate Type: Serosanguineous Exudate Color: red, brown [id number].pdf Page 6 of 9 Foul Odor After Cleansing: No Slough/Fibrino Yes Wound Bed Granulation Amount: Small (1-33%) Exposed Structure Granulation Quality: Red Fascia Exposed: No Necrotic Amount: Large (67-100%) Fat Layer (Subcutaneous Tissue) Exposed: Yes Necrotic Quality: Adherent Slough Tendon Exposed: No Muscle Exposed: No Joint Exposed: No Bone Exposed: No Treatment Notes Wound #10 (Lower Leg) Wound Laterality: Left, Posterior Cleanser Peri-Wound Care Topical Gentamicin Discharge Instruction: in wound clinic only Mupirocin Ointment Discharge Instruction: in wound clinic only Primary Dressing AquacelAg Advantage Dressing, 2X2 (in/in) Discharge Instruction: Apply to wound as directed Secondary Dressing ABD Pad 5x9 (in/in) Discharge Instruction: Cover with ABD pad Secured With Compression Wrap 3-LAYER WRAP - Profore Lite LF 3 Multilayer Compression Bandaging System Discharge Instruction: Apply 3 multi-layer wrap as prescribed. Compression Stockings Add-Ons Electronic Signature(s) Signed: 03/28/2023 2:56:04 PM By: Bonnell Public Entered By: Bonnell Public on 03/28/2023 09:40:05 -------------------------------------------------------------------------------- Wound Assessment Details Patient Name: Date of Service: Laura Santiago. 03/28/2023 9:15 A M Medical Record Number: [id number] Patient Account Number: [id number] Date of Birth/Sex: Treating RN: Mar 15, 1946 (77 y.o. Starleen Arms, Leah Primary Care Kristan Brummitt: Marisue Ivan Other Clinician: Referring Jammal Sarr: Treating Jakaleb Payer/Extender: Claudie Fisherman Weeks in Treatment: 90 Wound Status Wound Number: 11 Primary Venous Leg Ulcer Etiology: Wound Location:  Left, Lateral Lower Leg Wound Status: Open Wounding Event: Gradually Appeared Comorbid Hypertension, Osteoarthritis, Received Chemotherapy, Date Acquired: 03/04/2023 History: Received Radiation Weeks Of Treatment: 2 Clustered Wound: No Photos ROZALIE, COMOLLI ([id number]) [id number] Page 7 of 9 Wound Measurements Length: (cm) 1.6 Width: (cm) 1 Depth: (cm) 0.2 Area: (cm) 1.257 Volume: (cm) 0.251 % Reduction in Area: 5.8% % Reduction in Volume: -87.3% Epithelialization: None Wound Description Classification: Full Thickness Without Exposed Suppor Wound Margin: Distinct, outline attached Exudate Amount: Small Exudate Type: Serosanguineous Exudate Color: red, brown t Structures Foul Odor After Cleansing: No Slough/Fibrino Yes Wound Bed Granulation Amount: Large (67-100%) Exposed Structure Granulation Quality:  Red Fascia Exposed: No Necrotic Amount: Small (1-33%) Fat Layer (Subcutaneous Tissue) Exposed: Yes Necrotic Quality: Adherent Slough Tendon Exposed: No Muscle Exposed: No Joint Exposed: No Bone Exposed: No Treatment Notes Wound #11 (Lower Leg) Wound Laterality: Left, Lateral Cleanser Peri-Wound Care Topical Gentamicin Discharge Instruction: in wound clinic only Mupirocin Ointment Discharge Instruction: in wound clinic only Primary Dressing AquacelAg Advantage Dressing, 2X2 (in/in) Discharge Instruction: Apply to wound as directed Secondary Dressing ABD Pad 5x9 (in/in) Discharge Instruction: Cover with ABD pad Secured With Compression Wrap 3-LAYER WRAP - Profore Lite LF 3 Multilayer Compression Bandaging System Discharge Instruction: Apply 3 multi-layer wrap as prescribed. Compression Stockings Add-Ons Electronic Signature(s) Signed: 03/28/2023 2:56:04 PM By: Bonnell Public Entered By: Bonnell Public on 03/28/2023 09:36:46 Laura Santiago ([id number]) [id number] Page 8 of  9 -------------------------------------------------------------------------------- Wound Assessment Details Patient Name: Date of Service: Laura Santiago. 03/28/2023 9:15 A M Medical Record Number: [id number] Patient Account Number: [id number] Date of Birth/Sex: Treating RN: [birth year] (77 y.o. Starleen Arms, Leah Primary Care Jamilee Lafosse: Marisue Ivan Other Clinician: Referring Dan Dissinger: Treating Kirsten Spearing/Extender: Greta Doom in Treatment: 90 Wound Status Wound Number: 12 Primary Venous Leg Ulcer Etiology: Wound Location: Left, Medial Ankle Wound Status: Open Wounding Event: Gradually Appeared Comorbid Hypertension, Osteoarthritis, Received Chemotherapy, Date Acquired: 03/04/2023 History: Received Radiation Weeks Of Treatment: 2 Clustered Wound: Yes Photos Wound Measurements Length: (cm) 3 Width: (cm) 5 Depth: (cm) 0.2 Area: (cm) 11.781 Volume: (cm) 2.356 % Reduction in Area: -290.6% % Reduction in Volume: -680.1% Epithelialization: None Wound Description Classification: Full Thickness Without Exposed Suppor Wound Margin: Distinct, outline attached Exudate Amount: Medium Exudate Type: Serosanguineous Exudate Color: red, brown t Structures Foul Odor After Cleansing: No Slough/Fibrino Yes Wound Bed Granulation Amount: Small (1-33%) Exposed Structure Granulation Quality: Pink Fascia Exposed: No Necrotic Amount: Large (67-100%) Fat Layer (Subcutaneous Tissue) Exposed: Yes Necrotic Quality: Adherent Slough Tendon Exposed: No Muscle Exposed: No Joint Exposed: No Bone Exposed: No Treatment Notes Wound #12 (Ankle) Wound Laterality: Left, Medial Cleanser Peri-Wound Care Topical Gentamicin Discharge Instruction: in wound clinic only Mupirocin Ointment Discharge Instruction: in wound clinic only Primary Dressing AquacelAg Advantage Dressing, 2X2 (in/in) Discharge Instruction: Apply to wound as directed Secondary  Dressing IVERY, MICHALSKI ([id number]) [id number] Page 9 of 9 ABD Pad 5x9 (in/in) Discharge Instruction: Cover with ABD pad Secured With Compression Wrap 3-LAYER WRAP - Profore Lite LF 3 Multilayer Compression Bandaging System Discharge Instruction: Apply 3 multi-layer wrap as prescribed. Compression Stockings Add-Ons Electronic Signature(s) Signed: 03/28/2023 2:56:04 PM By: Bonnell Public Entered By: Bonnell Public on 03/28/2023 09:45:28 -------------------------------------------------------------------------------- Vitals Details Patient Name: Date of Service: [name], BO [name] 03/28/2023 9:15 A M Medical Record Number: [id number] Patient Account Number: [id number] Date of Birth/Sex: Treating RN: March 30, 1946 (77 y.o. Starleen Arms, Leah Primary Care Michiah Mudry: Marisue Ivan Other Clinician: Referring Athena Baltz: Treating Ajahni Nay/Extender: Greta Doom in Treatment: 73 Vital Signs Time Taken: 09:21 Temperature (F): 97.9 Height (in): 66 Pulse (bpm): 56 Weight (lbs): 153 Respiratory Rate (breaths/min): 18 Body Mass Index (BMI): 24.7 Blood Pressure (mmHg): 129/69 Reference Range: 80 - 120 mg / dl Electronic Signature(s) Signed: 03/28/2023 2:56:04 PM By: Bonnell Public Entered By: Bonnell Public on 03/28/2023 09:23:42

## 2023-03-28 NOTE — Progress Notes (Signed)
HAVA, MASSINGALE ([id number]) [id number] Page 1 of 16 Visit Report for 03/28/2023 Chief Complaint Document Details Patient Name: Date of Service: Laura Santiago. 03/28/2023 9:15 A M Medical Record Number: [id number] Patient Account Number: [id number] Date of Birth/Sex: Treating RN: 1946-05-03 (77 y.o. Laura Santiago Primary Care Provider: Marisue Ivan Other Clinician: Referring Provider: Treating Provider/Extender: Greta Doom in Treatment: 33 Information Obtained from: Patient Chief Complaint Left lower extremity wound Right toe wounds Left upper lateral thigh wounds Electronic Signature(s) Signed: 03/28/2023 11:12:29 AM By: Geralyn Corwin DO Entered By: Geralyn Corwin on 03/28/2023 10:26:28 -------------------------------------------------------------------------------- Debridement Details Patient Name: Date of Service: Laura Santiago [name] 03/28/2023 9:15 A M Medical Record Number: [id number] Patient Account Number: [id number] Date of Birth/Sex: Treating RN: 1945-12-14 (77 y.o. Laura Santiago Primary Care Provider: Marisue Ivan Other Clinician: Referring Provider: Treating Provider/Extender: Greta Doom in Treatment: 74 Debridement Performed for Assessment: Wound #12 Left,Medial Ankle Performed By: Physician Geralyn Corwin, MD Debridement Type: Debridement Severity of Tissue Pre Debridement: Fat layer exposed Level of Consciousness (Pre-procedure): Awake and Alert Pre-procedure Verification/Time Out Yes - 09:53 Taken: Start Time: 09:53 Pain Control: Lidocaine 2% T opical Gel T Area Debrided (L x W): otal 3 (cm) x 5 (cm) = 15 (cm) Tissue and other material debrided: Viable, Non-Viable, Slough, Subcutaneous, Slough Level: Skin/Subcutaneous Tissue Debridement Description: Excisional Instrument: Curette Bleeding: Minimum Hemostasis Achieved: Pressure End Time:  09:54 Procedural Pain: 0 Post Procedural Pain: 0 Response to Treatment: Procedure was tolerated well Level of Consciousness (Post- Awake and Alert procedure): Post Debridement Measurements of Total Wound Length: (cm) 3 Width: (cm) 5 Depth: (cm) 0.2 Volume: (cm) 2.356 Character of Wound/Ulcer Post Debridement: Improved Severity of Tissue Post Debridement: Fat layer exposed Post Procedure Diagnosis Same as Pre-procedure Electronic Signature(s) Signed: 03/28/2023 11:12:29 AM By: Geralyn Corwin DO Signed: 03/28/2023 2:56:04 PM By: Chapman Moss [phone] By: Bonnell Public [phone].pdf Page 2 of 16 Signed: 03/28/2023 2:56:04 Entered By: Bonnell Public on 03/28/2023 09:56:28 -------------------------------------------------------------------------------- Debridement Details Patient Name: Date of Service: Laura Santiago. 03/28/2023 9:15 A M Medical Record Number: [id number] Patient Account Number: [id number] Date of Birth/Sex: Treating RN: 10-Sep-1946 (77 y.o. Laura Santiago Primary Care Provider: Marisue Ivan Other Clinician: Referring Provider: Treating Provider/Extender: Greta Doom in Treatment: 39 Debridement Performed for Assessment: Wound #11 Left,Lateral Lower Leg Performed By: Physician Geralyn Corwin, MD Debridement Type: Debridement Severity of Tissue Pre Debridement: Fat layer exposed Level of Consciousness (Pre-procedure): Awake and Alert Pre-procedure Verification/Time Out Yes - 09:53 Taken: Start Time: 09:53 Pain Control: Lidocaine 2% T opical Gel T Area Debrided (L x W): otal 1.6 (cm) x 1 (cm) = 1.6 (cm) Tissue and other material debrided: Viable, Non-Viable, Slough, Slough Level: Non-Viable Tissue Debridement Description: Selective/Open Wound Instrument: Curette Bleeding: Minimum Hemostasis Achieved: Pressure End Time: 09:54 Procedural Pain: 0 Post Procedural Pain:  0 Response to Treatment: Procedure was tolerated well Level of Consciousness (Post- Awake and Alert procedure): Post Debridement Measurements of Total Wound Length: (cm) 1.6 Width: (cm) 1 Depth: (cm) 0.2 Volume: (cm) 0.251 Character of Wound/Ulcer Post Debridement: Improved Severity of Tissue Post Debridement: Fat layer exposed Post Procedure Diagnosis Same as Pre-procedure Electronic Signature(s) Signed: 03/28/2023 11:12:29 AM By: Geralyn Corwin DO Signed: 03/28/2023 2:56:04 PM By: Bonnell Public Entered By: Bonnell Public on 03/28/2023 09:56:42 -------------------------------------------------------------------------------- Debridement Details Patient Name: Date of Service: Laura Santiago, BO [name] 03/28/2023 9:15 A M Medical Record Number:  [id number] Patient Account Number: [id number] Date of Birth/Sex: Treating RN: 07-Dec-1946 (77 y.o. Laura Santiago Primary Care Provider: Marisue Ivan Other Clinician: Referring Provider: Treating Provider/Extender: Greta Doom in Treatment: 24 Debridement Performed for Assessment: Wound #10 Left,Posterior Lower Leg Performed By: Physician Geralyn Corwin, MD Debridement Type: Debridement Severity of Tissue Pre Debridement: Fat layer exposed Level of Consciousness (Pre-procedure): Awake and Alert Pre-procedure Verification/Time Out Yes - 09:53 Taken: Start Time: 09:53 Pain Control: Lidocaine 2% T opical Gel T Area Debrided (L x W): otal 9 (cm) x 9 (cm) = 81 (cm) Tissue and other material debrided: Viable, Non-Viable, Slough, Subcutaneous, Slough Level: Skin/Subcutaneous Tissue [name], [name] ([id number]) [id number] Page 3 of 16 Debridement Description: Excisional Instrument: Curette Bleeding: Minimum Hemostasis Achieved: Pressure End Time: 09:54 Procedural Pain: 0 Post Procedural Pain: 0 Response to Treatment: Procedure was tolerated well Level of Consciousness (Post-  Awake and Alert procedure): Post Debridement Measurements of Total Wound Length: (cm) 9 Width: (cm) 9 Depth: (cm) 0.2 Volume: (cm) 12.723 Character of Wound/Ulcer Post Debridement: Improved Severity of Tissue Post Debridement: Fat layer exposed Post Procedure Diagnosis Same as Pre-procedure Electronic Signature(s) Signed: 03/28/2023 11:12:29 AM By: Geralyn Corwin DO Signed: 03/28/2023 2:56:04 PM By: Bonnell Public Entered By: Bonnell Public on 03/28/2023 09:57:11 -------------------------------------------------------------------------------- HPI Details Patient Name: Date of Service: Laura Santiago [name] 03/28/2023 9:15 A M Medical Record Number: [id number] Patient Account Number: [id number] Date of Birth/Sex: Treating RN: Jan 14, 1946 (77 y.o. Laura Santiago Primary Care Provider: Marisue Ivan Other Clinician: Referring Provider: Treating Provider/Extender: Greta Doom in Treatment: 15 History of Present Illness HPI Description: Admission 7/27 Ms. Unita Detamore is a 77 year old female with a past medical history of ADHD, metastatic breast cancer, stage IV chronic kidney disease, history of DVT on Xarelto and chronic venous insufficiency that presents to the clinic for a chronic left lower extremity wound. She recently moved to [facility] 4 days ago. She was being followed by wound care center in [place]. She reports a 10-year history of wounds to her left lower extremity that eventually do heal with debridement and compression therapy. She states that the current wound reopened 4 months ago and she is using Vaseline and Coban. She denies signs of infection. 8/3; patient presents for 1 week follow-up. She reports no issues or complaints today. She states she had vascular studies done in the last week. She denies signs of infection. She brought her little service dog with her today. 8/17; patient presents for follow-up. She has missed  her last clinic appointment. She states she took the wrap off and attempted to rewrap her leg. She is having difficulty with transportation. She has her service dog with her today. Overall she feels well and reports improvement in wound healing. She denies signs of infection. She reports owning an old Velcro wrap compression and has this at her living facility 9/14; patient presents for follow-up. Patient states that over the past 2 to 3 weeks she developed toe wounds to her right foot. She attributes this to tight fitting shoes. She subsequently developed cellulitis in the right leg and has been treated by doxycycline by her oncologist. She reports improvement in symptoms however continues to have some redness and swelling to this leg. T the left lower extremity patient has been having her wraps changed with home health twice weekly. She states that the [facility] is not helping control o the drainage. Other than that she has no issues or complaints  today. She denies signs of infection to the left lower extremity. 9/21; patient presents for follow-up. She reports seeing infectious disease for her cellulitis. She reports no further management. She has home health that changes the wraps twice weekly. She has no issues or complaints today. She denies signs of infection. 10/5; patient presents for follow-up. She has no issues or complaints today. She denies signs of infection. She states that the right great toe has not been dressed by home health. 10/12; patient presents for follow-up. She has no issues or complaints today. She reports improvement in her wound healing. She has been using silver alginate to the right great toe wound. She denies signs of infection. 10/26; patient presents for follow-up. Home health did not have sorbact so they continued to use Hydrofera Blue under the wrap. She has been using silver alginate to the great toe wound however she did not have a dressing in place today.  She currently denies signs of infection. 11/2; patient presents for follow-up. She has been using sorb act under the compression wrap. She reports using silver alginate to the toe wound again she does not have a dressing in place. She currently denies signs of infection. 11/23; patient presents for follow-up. Unfortunately she has missed her last 2 clinic appointments. She was last seen 3 weeks ago. She did her own compression wrap with Kerlix and Coban yesterday after seeing vein and vascular. She has not been dressing her right great toe wound. She currently denies signs of infection. NAJAI, WASZAK ([id number]) [id number] Page 4 of 16 11/30; patient presents for 1 week follow-up. She states she changed her dressing last week prior to home health and use sorb act with Dakin's and Hydrofera Blue. Home health has changed the dressing as well and they have been using sorbact. T oday she reports increased redness to her right lower extremity. She has a history of cellulitis to this leg. She has been using silver alginate to the right great toe. Unfortunately she had an episode of diarrhea prior to coming in and had feces all over the right leg and to the wrap of her left leg. 12/7; patient presents for 1 week follow-up. She states that home health did not come out to change the dressing and she took it off yesterday. It is unclear if she is dressing the right toe wound. She denies signs of infection. 12/14; patient presents for 1 week follow-up. She has no issues or complaints today. 12/21; patient presents for follow-up. She has no issues or complaints today. She denies signs of infection. 12/28/2021; patient presents for follow-up. She was hospitalized for sepsis secondary to right lower extremity cellulitis On 12/23. She states she is currently at a SNF. She states that she was started on doxycycline this morning for her right great toe swelling and redness. She is not  sure what dressings have been done to her left lower extremity for the past 3 weeks. She says its been mainly gauze with an Ace wrap. 1/25; patient presents for follow-up. She is still residing in a skilled nursing facility. She reports mild pain to the left lower extremity wound bed. She states she is going to see a podiatrist soon. 2/8; patient presents for follow-up. She has moved back to her residential community from her skilled nursing facility. She has no issues or complaints today. She denies signs of systemic infections. 2/15; patient presents for follow-up. He has no issues or complaints today. She denies systemic signs of infection. 2/22;  patient presents for follow-up. She has no issues or complaints today. She denies signs of infection. 3/1; patient presents for follow-up. She states that home health came out the day after she was seen in our clinic and yesterday to do the wrap change. She denies signs of infection. She reports excoriated skin on the ankle. 3/8; patient presents for follow-up. She has no issues or complaints today. She denies signs of infection. 3/15; patient presents for follow-up. Home health has been coming out to change the dressings. She reports more tenderness to the wound site. She denies purulent drainage, increased warmth or erythema to the area. 4/5; patient presents for follow-up. She has missed her last 2 clinic appointments. I have not seen her in 3 weeks. She was recently hospitalized for altered mental status. She was involuntarily committed. She was evaluated by psychiatry and deemed to have competency. There was no specific cause of her altered mental status. It was concluded that her physical and mental health were declining due to her chronic medical conditions. Currently home health has been coming out for dressing changes. Patient has also been doing her own dressing changes. She reports more skin breakdown to the periwound and now has a new wound.  She denies fever/chills. She reports continued tenderness to the wound site. 4/12; patient with significant venous insufficiency and a large wound on her left lower leg taking up about 80% of the circumference of her lower leg. Cultures of this grew MRSA and Pseudomonas. She had completed a course of ciprofloxacin now is starting doxycycline. She has been using Dakin's wet-to-dry and a Tubigrip. She has home health twice a week and we change it once. 4/19; patient presents for follow-up. She completed her course of doxycycline. She has been using Dakin's wet-to-dry dressing and Tubigrip. Home health changes the dressing twice weekly. Currently she has no issues or complaints. 4/26; patient presents for follow-up. At last clinic visit orders for home health were Iodosorb under compression therapy. Unfortunately they did not have the dressing and have been using Dakin's and gentamicin under the wrap. Patient currently denies signs of infection. She has no issues or complaints today. 5/3; patient presents for follow-up. Again Iodosorb has not been used under the compression therapy when home health comes out to change the wrap and dressing. They have been using Sorbact. It is unclear why this is happening since we send orders weekly to the agency. She denies signs of infection. Patient has not purchased the [place] antibiotics. We reached out to the company and they said they have been trying to contact her on a regular basis. We gave the patient the number to call to order the medication. 5/10; patient presents for follow-up. She has no issues or complaints today. Again home health has not been using Iodosorb. Mepilex was on the wound bed. No other dressings noted. She brought in her Keystone antibiotics. She denies signs of infection. 5/17; patient presents for follow-up. Home health has come out twice since she was last seen. Joint well she has been using Keystone antibiotic with Sorbact under the  compression wrap. She has no issues or complaints today. She denies signs of infection. 5/24; patient presents for follow-up. We have been using Keystone antibiotics with Sorbact under compression therapy. She is tolerating the treatment well. She is reporting improvement in wound healing. She denies signs of infection. 5/31; patient presents for follow-up. We continue to do [facility] antibiotics with Sorbact under compression therapy. She continues to report improvement in wound  healing. Home health comes out and changes the dressing once weekly. 05-17-2022 upon evaluation today patient appears to be doing better in regard to her wound especially compared to the last time I saw her. Fortunately I do think that she is seeing improvements. With that being said I do believe that she may be benefit from sharp debridement today to clear away some of the necrotic debris I discussed that with her as well. She is an amendable to that plan. Otherwise she is very pleased with how the [facility] Echevaria is doing for her. 6/14; patient presents for follow-up. We have been using Keystone antibiotic with Sorbact and absorbent dressings under 3 layer compression. She has no issues or complaints today. She reports improvement in wound healing. She denies signs of infection. 6/21; patient presents for follow-up. We are continuing with [facility] antibiotic and Sorbact under 3 layer compression. Patient has no complaints. Continued wound healing is happening. She denies signs of infection. 6/28; patient presents for follow-up. We have been using Keystone antibiotic with Sorbact under 3 layer compression. Usually home health comes out and changes the dressing twice a week. Unfortunately they did not go out to change the dressing. It is unclear why. Patient did not call them. She currently denies signs of infection. 7/5; patient presents for follow-up. We have been using Keystone antibiotic with calcium alginate under 3 layer  compression. She reports improvement in wound healing. She denies signs of infection. Home health has come out to do dressing changes twice this past week. 7/12; patient presents for follow-up. We have been using Keystone antibiotic with calcium alginate under 3 layer compression. Patient states that home health came out once last week to change the dressing. She reports improvement in wound healing. She currently denies signs of infection. 7/19; patient presents for follow-up. We have been using Keystone antibiotic with calcium alginate under 3 layer compression. Home health came out once last week to change the dressing. She has no issues or complaints today. She denies signs of infection. 8/2; patient presents for follow-up. We have been using Keystone antibiotic with calcium alginate under 3 layer compression. Unfortunately she missed her appointment last week and home health did not come out to do dressing changes. Patient currently denies signs of infection. KARYNN, DEBLASI ([id number]) [id number] Page 5 of 16 8/9; patient presents for follow-up. We have been using Keystone with calcium alginate under 3 layer compression. She states that home health came out once last week. She currently denies signs of infection. Her wrap was completely wet. She states she was cleaning the top of the leg and water soaked down into the wrap. 8/16; patient presents for follow-up. We have been using Keystone with calcium alginate under 3 layer compression. She states that home health came out twice last week. She has no issues or complaints today. 8/23; patient presents for follow-up. He has been using Keystone with calcium alginate under 3 layer compression. Home health came out twice last week. She denies signs of infection. 8/30; patient presents for follow-up. We have been using Keystone with calcium alginate under 3 layer compression. Home health came out once last week  to change the dressing. Patient reports improvement in wound healing. She states she is almost done with her chemotherapy infusions and has 1 more left. 9/13; patient presents for follow-up. She has lost the capsules to her [facility] antibiotic which I believe is the vancomycin pills. She has her Zosyn powder today. We have been using Keystone antibiotic ointment with  calcium alginate under 3 layer compression. She is concerned about systemic infection however her vitals are stable and there is no surrounding soft tissue infection. She would like to remain a patient in our wound care center however would like a second opinion for her wound care at another facility. She asked to be referred to [facility] wound care center. 9/20; patient presents for follow-up. She found her vancomycin capsules and brought in her complete Keystone antibiotic ointment set today. Unfortunately she has developed skin breakdown and Erythema to the right lower extremity With increased swelling. She states she went to a pow wow Over the weekend and was on her feet for extended periods of time. She saw her oncologist yesterday who prescribed her doxycycline for her right lower extremity erythema. 9/27; patient presents for follow-up. We have been using Keystone antibiotic with Aquacel under 3 layer compression to the lower extremities bilaterally. When home health came and changed the wrap she secretly put coffee into the spray mix along with [facility] antibiotic on her leg thinking the acidic component would better activate the zoysn (sonething she discussed with her microbiologist brother). She has reported improvement in wound healing. 10/4; patient presents for follow-up. She has no issues or complaints today. We have been doing Aquacel and keystone under 3 layer compression to the lower extremities bilaterally. This morning she took the right lower extremity wrap off as it was uncomfortable. She has no open wounds to this  leg. 10/11; patient presents for follow-up. We have been doing Aquacel with Keystone antibiotic ointment under 3 layer compression to the left lower extremity. She developed a small blister to the anterior aspect of the left leg noticed when the wrap was taken off on intake. She currently denies signs of infection. 10/18; patient presents for follow-up. We have been doing Aquacel with Keystone antibiotic ointment under 3 layer compression to the left lower extremity. There has been continued improvement in wound healing. She denies signs of infection. 10/25; patient arrives for treatment of venous insufficiency ulcers on her left lower leg both lateral and medial are remanence of apparently a circumferential wound. Much improved. We are using topicals Keystone and Aquacel Ag under 3 layer compression we continue to make good progress. The patient talk to me at some length with regards to different things she has on her forehead and her Peri orbital area for which she is apparently applying Lebam. She feels that what ever we are treating on her wounds is a more systemic problem. I really was not able to get a handle on what she is talking about however I did caution her not to put the Humphrey in her eyes. 11/1; her wounds continue to improve she is using Keystone and Aquacel Ag G under 3 layer compression. Our intake nurse notes erythema and edema in the right leg. The patient has a litany of concerns with regards to a rash on her forehead or ears and other systemic complaints. She has an appointment with dermatology on November 11 11/8; patient presents for follow-up. We have been using Keystone and Aquacel under 4-layer compression. She has no issues or complaints today. She reports improvement in wound healing. 11/15; patient presents for follow-up. We have been using Aquacel with Keystone antibiotic under 3 layer compression. Patient continues improvement in wound healing. 12/6; patient  presents for follow-up. We have been using Aquacel with Keystone antibiotic ointment under 3 layer compression. Wounds appear well-healing. 12/13; patient presents for follow-up. We have been using Aquacel with  Keystone antibiotic under 3 layer compression. She has no issues or complaints today. 12/27 left lateral medial ankle. Superficial wounds remain there is significantly improved we are using Keystone backed with Zetuvit under 4-layer compression 1/3; patient presents for follow-up. Her wounds appear well-healing. We have been using Aquacel Ag with Keystone antibiotic ointment under compression therapy. This should be a 3 layer compression. 1/17; patient presents for follow-up. Her wounds on her left lower extremity are well-healing. We are using Aquacel Ag and Keystone antibiotic ointment under compression therapy. She missed her last clinic appointment. She states that the wrap has been changed twice weekly since she was last seen. Unfortunately she has developed increased warmth and redness to the right lower extremity consistent with cellulitis. She states this started a few days ago. 1/24; patient presents for follow-up. Her wounds on the left lower extremity are well-healing with Aquacel and Keystone antibiotic ointment under compression therapy. I prescribed Keflex at last clinic visit for cellulitis of the right lower extremity. Unfortunately this has not resolved. She did not follow-up with her PCP for contact our office about her symptoms. 1/31; patient presents for follow-up. We have been using Aquacel and Keystone antibiotic ointment under compression therapy to the left lower extremity. Wounds appear well-healing. She has been taking clindamycin for the past week. Her symptoms have improved slightly with a decrease in erythema and warmth to the right lower extremity. She says her pain level has improved. However Symptoms have not completely resolved. She denies  fever/chills, nausea/vomiting. 2/7; patient presents for follow-up. We have been using Aquacel Ag with Keystone antibiotic ointment under compression therapy to the left lower extremity. For the past week home health has not been using Keystone antibiotic ointment. She continues to take clindamycin. Her symptoms have improved greatly with the decrease in erythema and warmth to the right lower extremity. She denies systemic signs of infection. She has an area of skin breakdown to the right anterior leg. 2/14; Patient presents for follow-up. She has been using Hydrofera Blue to the right anterior leg under Tubigrip. It looks like Hydrofera Blue is also being used with Keystone to the left lower extremity under compression therapy. Order is for Aqualcell Ag. Overall wounds appear well healing. She has no issues or complaints. 2/21; patient presents for follow-up. We have been using Hydrofera Blue under 3 layer compression to the lower extremities bilaterally. Her right lower extremity wounds have healed. She has a small open wound remaining to her left lateral leg. 2/28; patient presents for follow-up. We have been using Hydrofera Blue under 3 layer compression. Home health has changed the dressing to the left lower extremity however started the wrap at the ankle. 3/6; patient presents for follow-up. We have been using PolyMem with antibiotic ointment under 3 layer compression to the left lower extremity. She has no issues or complaints today. ALEKTRA, GURECKI ([id number]) [id number] Page 6 of 16 3/12; patient presents for follow-up. We have been using Aquacel Ag under 3 layer compression to the left lower extremity. Her wound is healed. She has juxta lite compression wraps at home. Readmission: 03-13-2023 upon evaluation today patient appears to be doing poorly in regard to her lower extremities. Specifically it is the left lower extremity at this point that is causing  her problems she has multiple wounds open. With that being said she does not sound like she has been wearing her compression stockings on a regular basis she has previously seen Dr. Mikey Bussing. With that being said upon  evaluation today she tells me that since the wounds have reopened that she has been having a lot of discomfort she also tells me that she went back to using some of the dressings that she had leftover at home she does not know exactly what everything was. Nonetheless she also tells me that she has made an appointment with a dermatologist in town and has to see them tomorrow therefore she is not going to allow [place] to wrap her legs today since "that did not work anyway". With that being said obviously she did improve last time we got her wound healed we order her juxta lite compression wraps again it does not sound like she has been using those appropriately at home. 4/10; patient presents for follow-up. Unfortunately she reopened last week and was seen by [facility]. At that time the patient declined in office wraps. She states she is wearing her juxta lite compression wraps however she did not have these on today. 4/17; patient presents for follow-up. At last clinic visit a PCR culture was done that grew Staph aureus and Enterobacter cola CA and Enterococcus bacillus. Keystone antibiotic ointment was ordered. She has not received this yet. We have been using antibiotic ointment with Hydrofera Blue under compression therapy. There is been improvement in her wound healing. She has no issues or complaints today. Electronic Signature(s) Signed: 03/28/2023 11:12:29 AM By: Geralyn Corwin DO Entered By: Geralyn Corwin on 03/28/2023 10:27:46 -------------------------------------------------------------------------------- Physical Exam Details Patient Name: Date of Service: Laura Santiago [name] 03/28/2023 9:15 A M Medical Record Number: [id number] Patient Account Number: [id number] Date of  Birth/Sex: Treating RN: 11-Aug-1946 (77 y.o. Laura Santiago Primary Care Provider: Marisue Ivan Other Clinician: Referring Provider: Treating Provider/Extender: Greta Doom in Treatment: 46 Constitutional . Cardiovascular . Psychiatric . Notes Multiple open wounds to the left lower extremity with nonviable tissue. No signs of surrounding infection. 3+ pitting edema to the knee. Electronic Signature(s) Signed: 03/28/2023 11:12:29 AM By: Geralyn Corwin DO Entered By: Geralyn Corwin on 03/28/2023 10:28:38 -------------------------------------------------------------------------------- Physician Orders Details Patient Name: Date of Service: Laura Santiago [name] 03/28/2023 9:15 A M Medical Record Number: [id number] Patient Account Number: [id number] Date of Birth/Sex: Treating RN: [birth year] (77 y.o. Laura Santiago Primary Care Provider: Marisue Ivan Other Clinician: Referring Provider: Treating Provider/Extender: Greta Doom in Treatment: 65 Verbal / Phone Orders: No Diagnosis Coding Follow-up Appointments Return Appointment in 1 week. Nurse Visit as needed Home Health Home Health Company: [facility] [phone] **Please direct any NON-WOUND related issues/requests for orders to patient's Primary Care Physician. **If current dressing causes regression in wound condition, may D/C ordered dressing product/s and apply Normal Saline Moist Dressing daily until next Wound Healing Center or Other MD appointment. **Notify Wound Healing Center of regression in wound condition at [phone]. KENYATA, NAPIER ([id number]) [id number] Page 7 of 16 Bathing/ Shower/ Hygiene May shower with wound dressing protected with water repellent cover or cast protector. Edema Control - Lymphedema / Segmental Compressive Device / Other Optional: One layer of unna paste to top of compression wrap (to  act as an anchor). Elevate, Exercise Daily and A void Standing for Long Periods of Time. Elevate legs to the level of the heart and pump ankles as often as possible Elevate leg(s) parallel to the floor when sitting. DO YOUR BEST to sleep in the bed at night. DO NOT sleep in your recliner. Long hours of sitting in a recliner leads to swelling of the  legs and/or potential wounds on your backside. Additional Orders / Instructions Follow Nutritious Diet and Increase Protein Intake Wound Treatment Wound #10 - Lower Leg Wound Laterality: Left, Posterior Topical: Gentamicin 3 x Per Week/30 Days Discharge Instructions: in wound clinic only Topical: Mupirocin Ointment 3 x Per Week/30 Days Discharge Instructions: in wound clinic only Prim Dressing: AquacelAg Advantage Dressing, 2X2 (in/in) 3 x Per Week/30 Days ary Discharge Instructions: Apply to wound as directed Secondary Dressing: ABD Pad 5x9 (in/in) 3 x Per Week/30 Days Discharge Instructions: Cover with ABD pad Compression Wrap: 3-LAYER WRAP - Profore Lite LF 3 Multilayer Compression Bandaging System 3 x Per Week/30 Days Discharge Instructions: Apply 3 multi-layer wrap as prescribed. Wound #11 - Lower Leg Wound Laterality: Left, Lateral Topical: Gentamicin 3 x Per Week/30 Days Discharge Instructions: in wound clinic only Topical: Mupirocin Ointment 3 x Per Week/30 Days Discharge Instructions: in wound clinic only Prim Dressing: AquacelAg Advantage Dressing, 2X2 (in/in) 3 x Per Week/30 Days ary Discharge Instructions: Apply to wound as directed Secondary Dressing: ABD Pad 5x9 (in/in) 3 x Per Week/30 Days Discharge Instructions: Cover with ABD pad Compression Wrap: 3-LAYER WRAP - Profore Lite LF 3 Multilayer Compression Bandaging System 3 x Per Week/30 Days Discharge Instructions: Apply 3 multi-layer wrap as prescribed. Wound #12 - Ankle Wound Laterality: Left, Medial Topical: Gentamicin 3 x Per Week/30 Days Discharge Instructions: in  wound clinic only Topical: Mupirocin Ointment 3 x Per Week/30 Days Discharge Instructions: in wound clinic only Prim Dressing: AquacelAg Advantage Dressing, 2X2 (in/in) 3 x Per Week/30 Days ary Discharge Instructions: Apply to wound as directed Secondary Dressing: ABD Pad 5x9 (in/in) 3 x Per Week/30 Days Discharge Instructions: Cover with ABD pad Compression Wrap: 3-LAYER WRAP - Profore Lite LF 3 Multilayer Compression Bandaging System 3 x Per Week/30 Days Discharge Instructions: Apply 3 multi-layer wrap as prescribed. Consults Vascular - venous reflux and intervention Electronic Signature(s) Signed: 03/28/2023 11:12:29 AM By: Geralyn Corwin DO Entered By: Geralyn Corwin on 03/28/2023 10:33:10 [name] ([id number]) [id number] Page 8 of 16 -------------------------------------------------------------------------------- Problem List Details Patient Name: Date of Service: Laura Santiago. 03/28/2023 9:15 A M Medical Record Number: [id number] Patient Account Number: [id number] Date of Birth/Sex: Treating RN: 12-Jun-1946 (77 y.o. Laura Santiago Primary Care Provider: Marisue Ivan Other Clinician: Referring Provider: Treating Provider/Extender: Greta Doom in Treatment: 46 Active Problems ICD-10 Encounter Code Description Active Date MDM Diagnosis [id number] Non-pressure chronic ulcer of other part of left lower leg with fat layer exposed11/23/2022 No Yes 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 11/02/2021 No Yes I87.311 Chronic venous hypertension (idiopathic) with ulcer of right lower extremity 08/30/2022 No Yes I87.2 Venous insufficiency (chronic) (peripheral) 07/06/2021 No Yes Z79.01 Long term (current) use of anticoagulants 07/06/2021 No Yes I10 Essential (primary) hypertension 07/06/2021 No Yes 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 07/06/2021 No Yes L03.115  Cellulitis of right lower limb 12/27/2022 No Yes S81.801A Unspecified open wound, right lower leg, initial encounter 01/17/2023 No Yes Inactive Problems ICD-10 Code Description Active Date Inactive Date S81.802A Unspecified open wound, left lower leg, initial encounter 07/06/2021 07/06/2021 S91.101A Unspecified open wound of right great toe without damage to nail, initial encounter 08/24/2021 08/24/2021 S91.104A Unspecified open wound of right lesser toe(s) without damage to nail, initial encounter 08/24/2021 08/24/2021 Resolved Problems ICD-10 Code Description Active Date Resolved Date S91.104D Unspecified open wound of right lesser toe(s) without damage to nail, subsequent 08/31/2021 08/31/2021 encounter S91.201D Unspecified open wound of right  great toe with damage to nail, subsequent encounter 08/31/2021 08/31/2021 ODEAL, WELDEN ([id number]) [id number] Page 9 of 16 Electronic Signature(s) Signed: 03/28/2023 11:12:29 AM By: Geralyn Corwin DO Entered By: Geralyn Corwin on 03/28/2023 10:26:23 -------------------------------------------------------------------------------- Progress Note Details Patient Name: Date of Service: Laura Santiago [name] 03/28/2023 9:15 A M Medical Record Number: [id number] Patient Account Number: [id number] Date of Birth/Sex: Treating RN: Apr 28, 1946 (77 y.o. Laura Santiago Primary Care Provider: Marisue Ivan Other Clinician: Referring Provider: Treating Provider/Extender: Greta Doom in Treatment: 34 Subjective Chief Complaint Information obtained from Patient Left lower extremity wound Right toe wounds Left upper lateral thigh wounds History of Present Illness (HPI) Admission 7/27 Ms. Shelda Truby is a 77 year old female with a past medical history of ADHD, metastatic breast cancer, stage IV chronic kidney disease, history of DVT on Xarelto and chronic venous insufficiency that presents to the  clinic for a chronic left lower extremity wound. She recently moved to [facility] 4 days ago. She was being followed by wound care center in [place]. She reports a 10-year history of wounds to her left lower extremity that eventually do heal with debridement and compression therapy. She states that the current wound reopened 4 months ago and she is using Vaseline and Coban. She denies signs of infection. 8/3; patient presents for 1 week follow-up. She reports no issues or complaints today. She states she had vascular studies done in the last week. She denies signs of infection. She brought her little service dog with her today. 8/17; patient presents for follow-up. She has missed her last clinic appointment. She states she took the wrap off and attempted to rewrap her leg. She is having difficulty with transportation. She has her service dog with her today. Overall she feels well and reports improvement in wound healing. She denies signs of infection. She reports owning an old Velcro wrap compression and has this at her living facility 9/14; patient presents for follow-up. Patient states that over the past 2 to 3 weeks she developed toe wounds to her right foot. She attributes this to tight fitting shoes. She subsequently developed cellulitis in the right leg and has been treated by doxycycline by her oncologist. She reports improvement in symptoms however continues to have some redness and swelling to this leg. T the left lower extremity patient has been having her wraps changed with home health twice weekly. She states that the [facility] is not helping control o the drainage. Other than that she has no issues or complaints today. She denies signs of infection to the left lower extremity. 9/21; patient presents for follow-up. She reports seeing infectious disease for her cellulitis. She reports no further management. She has home health that changes the wraps twice weekly. She has  no issues or complaints today. She denies signs of infection. 10/5; patient presents for follow-up. She has no issues or complaints today. She denies signs of infection. She states that the right great toe has not been dressed by home health. 10/12; patient presents for follow-up. She has no issues or complaints today. She reports improvement in her wound healing. She has been using silver alginate to the right great toe wound. She denies signs of infection. 10/26; patient presents for follow-up. Home health did not have sorbact so they continued to use Hydrofera Blue under the wrap. She has been using silver alginate to the great toe wound however she did not have a dressing in place today. She currently denies signs  of infection. 11/2; patient presents for follow-up. She has been using sorb act under the compression wrap. She reports using silver alginate to the toe wound again she does not have a dressing in place. She currently denies signs of infection. 11/23; patient presents for follow-up. Unfortunately she has missed her last 2 clinic appointments. She was last seen 3 weeks ago. She did her own compression wrap with Kerlix and Coban yesterday after seeing vein and vascular. She has not been dressing her right great toe wound. She currently denies signs of infection. 11/30; patient presents for 1 week follow-up. She states she changed her dressing last week prior to home health and use sorb act with Dakin's and Hydrofera Blue. Home health has changed the dressing as well and they have been using sorbact. T oday she reports increased redness to her right lower extremity. She has a history of cellulitis to this leg. She has been using silver alginate to the right great toe. Unfortunately she had an episode of diarrhea prior to coming in and had feces all over the right leg and to the wrap of her left leg. 12/7; patient presents for 1 week follow-up. She states that home health did not come out  to change the dressing and she took it off yesterday. It is unclear if she is dressing the right toe wound. She denies signs of infection. 12/14; patient presents for 1 week follow-up. She has no issues or complaints today. 12/21; patient presents for follow-up. She has no issues or complaints today. She denies signs of infection. 12/28/2021; patient presents for follow-up. She was hospitalized for sepsis secondary to right lower extremity cellulitis On 12/23. She states she is currently at a SNF. She states that she was started on doxycycline this morning for her right great toe swelling and redness. She is not sure what dressings have been done to her left lower extremity for the past 3 weeks. She says its been mainly gauze with an Ace wrap. 1/25; patient presents for follow-up. She is still residing in a skilled nursing facility. She reports mild pain to the left lower extremity wound bed. She states she is going to see a podiatrist soon. HASEL, JANISH ([id number]) [id number] Page 10 of 16 2/8; patient presents for follow-up. She has moved back to her residential community from her skilled nursing facility. She has no issues or complaints today. She denies signs of systemic infections. 2/15; patient presents for follow-up. He has no issues or complaints today. She denies systemic signs of infection. 2/22; patient presents for follow-up. She has no issues or complaints today. She denies signs of infection. 3/1; patient presents for follow-up. She states that home health came out the day after she was seen in our clinic and yesterday to do the wrap change. She denies signs of infection. She reports excoriated skin on the ankle. 3/8; patient presents for follow-up. She has no issues or complaints today. She denies signs of infection. 3/15; patient presents for follow-up. Home health has been coming out to change the dressings. She reports more tenderness to the wound  site. She denies purulent drainage, increased warmth or erythema to the area. 4/5; patient presents for follow-up. She has missed her last 2 clinic appointments. I have not seen her in 3 weeks. She was recently hospitalized for altered mental status. She was involuntarily committed. She was evaluated by psychiatry and deemed to have competency. There was no specific cause of her altered mental status. It was concluded that  her physical and mental health were declining due to her chronic medical conditions. Currently home health has been coming out for dressing changes. Patient has also been doing her own dressing changes. She reports more skin breakdown to the periwound and now has a new wound. She denies fever/chills. She reports continued tenderness to the wound site. 4/12; patient with significant venous insufficiency and a large wound on her left lower leg taking up about 80% of the circumference of her lower leg. Cultures of this grew MRSA and Pseudomonas. She had completed a course of ciprofloxacin now is starting doxycycline. She has been using Dakin's wet-to-dry and a Tubigrip. She has home health twice a week and we change it once. 4/19; patient presents for follow-up. She completed her course of doxycycline. She has been using Dakin's wet-to-dry dressing and Tubigrip. Home health changes the dressing twice weekly. Currently she has no issues or complaints. 4/26; patient presents for follow-up. At last clinic visit orders for home health were Iodosorb under compression therapy. Unfortunately they did not have the dressing and have been using Dakin's and gentamicin under the wrap. Patient currently denies signs of infection. She has no issues or complaints today. 5/3; patient presents for follow-up. Again Iodosorb has not been used under the compression therapy when home health comes out to change the wrap and dressing. They have been using Sorbact. It is unclear why this is happening since we  send orders weekly to the agency. She denies signs of infection. Patient has not purchased the [place] antibiotics. We reached out to the company and they said they have been trying to contact her on a regular basis. We gave the patient the number to call to order the medication. 5/10; patient presents for follow-up. She has no issues or complaints today. Again home health has not been using Iodosorb. Mepilex was on the wound bed. No other dressings noted. She brought in her Keystone antibiotics. She denies signs of infection. 5/17; patient presents for follow-up. Home health has come out twice since she was last seen. Joint well she has been using Keystone antibiotic with Sorbact under the compression wrap. She has no issues or complaints today. She denies signs of infection. 5/24; patient presents for follow-up. We have been using Keystone antibiotics with Sorbact under compression therapy. She is tolerating the treatment well. She is reporting improvement in wound healing. She denies signs of infection. 5/31; patient presents for follow-up. We continue to do [facility] antibiotics with Sorbact under compression therapy. She continues to report improvement in wound healing. Home health comes out and changes the dressing once weekly. 05-17-2022 upon evaluation today patient appears to be doing better in regard to her wound especially compared to the last time I saw her. Fortunately I do think that she is seeing improvements. With that being said I do believe that she may be benefit from sharp debridement today to clear away some of the necrotic debris I discussed that with her as well. She is an amendable to that plan. Otherwise she is very pleased with how the [facility] is doing for her. 6/14; patient presents for follow-up. We have been using Keystone antibiotic with Sorbact and absorbent dressings under 3 layer compression. She has no issues or complaints today. She reports improvement in wound  healing. She denies signs of infection. 6/21; patient presents for follow-up. We are continuing with [facility] antibiotic and Sorbact under 3 layer compression. Patient has no complaints. Continued wound healing is happening. She denies signs of infection.  6/28; patient presents for follow-up. We have been using Keystone antibiotic with Sorbact under 3 layer compression. Usually home health comes out and changes the dressing twice a week. Unfortunately they did not go out to change the dressing. It is unclear why. Patient did not call them. She currently denies signs of infection. 7/5; patient presents for follow-up. We have been using Keystone antibiotic with calcium alginate under 3 layer compression. She reports improvement in wound healing. She denies signs of infection. Home health has come out to do dressing changes twice this past week. 7/12; patient presents for follow-up. We have been using Keystone antibiotic with calcium alginate under 3 layer compression. Patient states that home health came out once last week to change the dressing. She reports improvement in wound healing. She currently denies signs of infection. 7/19; patient presents for follow-up. We have been using Keystone antibiotic with calcium alginate under 3 layer compression. Home health came out once last week to change the dressing. She has no issues or complaints today. She denies signs of infection. 8/2; patient presents for follow-up. We have been using Keystone antibiotic with calcium alginate under 3 layer compression. Unfortunately she missed her appointment last week and home health did not come out to do dressing changes. Patient currently denies signs of infection. 8/9; patient presents for follow-up. We have been using Keystone with calcium alginate under 3 layer compression. She states that home health came out once last week. She currently denies signs of infection. Her wrap was completely wet. She states she was  cleaning the top of the leg and water soaked down into the wrap. 8/16; patient presents for follow-up. We have been using Keystone with calcium alginate under 3 layer compression. She states that home health came out twice last week. She has no issues or complaints today. 8/23; patient presents for follow-up. He has been using Keystone with calcium alginate under 3 layer compression. Home health came out twice last week. She denies signs of infection. 8/30; patient presents for follow-up. We have been using Keystone with calcium alginate under 3 layer compression. Home health came out once last week to change the dressing. Patient reports improvement in wound healing. She states she is almost done with her chemotherapy infusions and has 1 more left. 9/13; patient presents for follow-up. She has lost the capsules to her [facility] antibiotic which I believe is the vancomycin pills. She has her Zosyn powder today. We have been using Keystone antibiotic ointment with calcium alginate under 3 layer compression. She is concerned about systemic infection however her vitals are stable and there is no surrounding soft tissue infection. She would like to remain a patient in our wound care center however would like a second opinion for her wound care at another facility. She asked to be referred to [facility] wound care center. JETT, KULZER ([id number]) [id number] Page 11 of 16 9/20; patient presents for follow-up. She found her vancomycin capsules and brought in her complete Keystone antibiotic ointment set today. Unfortunately she has developed skin breakdown and Erythema to the right lower extremity With increased swelling. She states she went to a pow wow Over the weekend and was on her feet for extended periods of time. She saw her oncologist yesterday who prescribed her doxycycline for her right lower extremity erythema. 9/27; patient presents for follow-up. We have been using  Keystone antibiotic with Aquacel under 3 layer compression to the lower extremities bilaterally. When home health came and changed the wrap she secretly  put coffee into the spray mix along with [facility] antibiotic on her leg thinking the acidic component would better activate the zoysn (sonething she discussed with her microbiologist brother). She has reported improvement in wound healing. 10/4; patient presents for follow-up. She has no issues or complaints today. We have been doing Aquacel and keystone under 3 layer compression to the lower extremities bilaterally. This morning she took the right lower extremity wrap off as it was uncomfortable. She has no open wounds to this leg. 10/11; patient presents for follow-up. We have been doing Aquacel with Keystone antibiotic ointment under 3 layer compression to the left lower extremity. She developed a small blister to the anterior aspect of the left leg noticed when the wrap was taken off on intake. She currently denies signs of infection. 10/18; patient presents for follow-up. We have been doing Aquacel with Keystone antibiotic ointment under 3 layer compression to the left lower extremity. There has been continued improvement in wound healing. She denies signs of infection. 10/25; patient arrives for treatment of venous insufficiency ulcers on her left lower leg both lateral and medial are remanence of apparently a circumferential wound. Much improved. We are using topicals Keystone and Aquacel Ag under 3 layer compression we continue to make good progress. The patient talk to me at some length with regards to different things she has on her forehead and her Peri orbital area for which she is apparently applying [place]. She feels that what ever we are treating on her wounds is a more systemic problem. I really was not able to get a handle on what she is talking about however I did caution her not to put the [place] in her eyes. 11/1; her wounds  continue to improve she is using Keystone and Aquacel Ag G under 3 layer compression. Our intake nurse notes erythema and edema in the right leg. The patient has a litany of concerns with regards to a rash on her forehead or ears and other systemic complaints. She has an appointment with dermatology on November 11 11/8; patient presents for follow-up. We have been using Keystone and Aquacel under 4-layer compression. She has no issues or complaints today. She reports improvement in wound healing. 11/15; patient presents for follow-up. We have been using Aquacel with Keystone antibiotic under 3 layer compression. Patient continues improvement in wound healing. 12/6; patient presents for follow-up. We have been using Aquacel with Keystone antibiotic ointment under 3 layer compression. Wounds appear well-healing. 12/13; patient presents for follow-up. We have been using Aquacel with Keystone antibiotic under 3 layer compression. She has no issues or complaints today. 12/27 left lateral medial ankle. Superficial wounds remain there is significantly improved we are using Keystone backed with Zetuvit under 4-layer compression 1/3; patient presents for follow-up. Her wounds appear well-healing. We have been using Aquacel Ag with Keystone antibiotic ointment under compression therapy. This should be a 3 layer compression. 1/17; patient presents for follow-up. Her wounds on her left lower extremity are well-healing. We are using Aquacel Ag and Keystone antibiotic ointment under compression therapy. She missed her last clinic appointment. She states that the wrap has been changed twice weekly since she was last seen. Unfortunately she has developed increased warmth and redness to the right lower extremity consistent with cellulitis. She states this started a few days ago. 1/24; patient presents for follow-up. Her wounds on the left lower extremity are well-healing with Aquacel and Keystone antibiotic ointment  under compression therapy. I prescribed Keflex at last clinic  visit for cellulitis of the right lower extremity. Unfortunately this has not resolved. She did not follow-up with her PCP for contact our office about her symptoms. 1/31; patient presents for follow-up. We have been using Aquacel and Keystone antibiotic ointment under compression therapy to the left lower extremity. Wounds appear well-healing. She has been taking clindamycin for the past week. Her symptoms have improved slightly with a decrease in erythema and warmth to the right lower extremity. She says her pain level has improved. However Symptoms have not completely resolved. She denies fever/chills, nausea/vomiting. 2/7; patient presents for follow-up. We have been using Aquacel Ag with Keystone antibiotic ointment under compression therapy to the left lower extremity. For the past week home health has not been using Keystone antibiotic ointment. She continues to take clindamycin. Her symptoms have improved greatly with the decrease in erythema and warmth to the right lower extremity. She denies systemic signs of infection. She has an area of skin breakdown to the right anterior leg. 2/14; Patient presents for follow-up. She has been using Hydrofera Blue to the right anterior leg under Tubigrip. It looks like Hydrofera Blue is also being used with Keystone to the left lower extremity under compression therapy. Order is for Aqualcell Ag. Overall wounds appear well healing. She has no issues or complaints. 2/21; patient presents for follow-up. We have been using Hydrofera Blue under 3 layer compression to the lower extremities bilaterally. Her right lower extremity wounds have healed. She has a small open wound remaining to her left lateral leg. 2/28; patient presents for follow-up. We have been using Hydrofera Blue under 3 layer compression. Home health has changed the dressing to the left lower extremity however started the wrap at  the ankle. 3/6; patient presents for follow-up. We have been using PolyMem with antibiotic ointment under 3 layer compression to the left lower extremity. She has no issues or complaints today. 3/12; patient presents for follow-up. We have been using Aquacel Ag under 3 layer compression to the left lower extremity. Her wound is healed. She has juxta lite compression wraps at home. Readmission: 03-13-2023 upon evaluation today patient appears to be doing poorly in regard to her lower extremities. Specifically it is the left lower extremity at this point that is causing her problems she has multiple wounds open. With that being said she does not sound like she has been wearing her compression stockings on a regular basis she has previously seen Dr. Mikey Bussing. With that being said upon evaluation today she tells me that since the wounds have reopened that she has been having a lot of discomfort she also tells me that she went back to using some of the dressings that she had leftover at home she does not know exactly what everything was. Nonetheless she also tells me that she has made an appointment with a dermatologist in town and has to see them tomorrow therefore she is not going to allow [place] to wrap her legs today since "that did not work anyway". With that being said obviously she did improve last time we got her wound healed we order her juxta lite compression wraps again it does not sound like she has been using those appropriately at home. 4/10; patient presents for follow-up. Unfortunately she reopened last week and was seen by [facility]. At that time the patient declined in office wraps. She states she is wearing her juxta lite compression wraps however she did not have these on today. 4/17; patient presents for follow-up. At last  clinic visit a PCR culture was done that grew Staph aureus and Enterobacter cola CA and Enterococcus bacillus. Keystone antibiotic ointment was ordered. She has not received  this yet. We have been using antibiotic ointment with [facility] under compression AMEL, KITCH ([id number]) [id number] Page 12 of 16 therapy. There is been improvement in her wound healing. She has no issues or complaints today. Objective Constitutional Vitals Time Taken: 9:21 AM, Height: 66 in, Weight: 153 lbs, BMI: 24.7, Temperature: 97.9 F, Pulse: 56 bpm, Respiratory Rate: 18 breaths/min, Blood Pressure: 129/69 mmHg. General Notes: Multiple open wounds to the left lower extremity with nonviable tissue. No signs of surrounding infection. 3+ pitting edema to the knee. Integumentary (Hair, Skin) Wound #10 status is Open. Original cause of wound was Gradually Appeared. The date acquired was: 03/04/2023. The wound has been in treatment 2 weeks. The wound is located on the Left,Posterior Lower Leg. The wound measures 9cm length x 9cm width x 0.5cm depth; 63.617cm^2 area and 31.809cm^3 volume. There is Fat Layer (Subcutaneous Tissue) exposed. There is a medium amount of serosanguineous drainage noted. The wound margin is distinct with the outline attached to the wound base. There is small (1-33%) red granulation within the wound bed. There is a large (67-100%) amount of necrotic tissue within the wound bed including Adherent Slough. Wound #11 status is Open. Original cause of wound was Gradually Appeared. The date acquired was: 03/04/2023. The wound has been in treatment 2 weeks. The wound is located on the Left,Lateral Lower Leg. The wound measures 1.6cm length x 1cm width x 0.2cm depth; 1.257cm^2 area and 0.251cm^3 volume. There is Fat Layer (Subcutaneous Tissue) exposed. There is a small amount of serosanguineous drainage noted. The wound margin is distinct with the outline attached to the wound base. There is large (67-100%) red granulation within the wound bed. There is a small (1-33%) amount of necrotic tissue within the wound bed including Adherent  Slough. Wound #12 status is Open. Original cause of wound was Gradually Appeared. The date acquired was: 03/04/2023. The wound has been in treatment 2 weeks. The wound is located on the Left,Medial Ankle. The wound measures 3cm length x 5cm width x 0.2cm depth; 11.781cm^2 area and 2.356cm^3 volume. There is Fat Layer (Subcutaneous Tissue) exposed. There is a medium amount of serosanguineous drainage noted. The wound margin is distinct with the outline attached to the wound base. There is small (1-33%) pink granulation within the wound bed. There is a large (67-100%) amount of necrotic tissue within the wound bed including Adherent Slough. 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 I debrided nonviable tissue. I recommended continuing with antibiotic ointment and we will switch to Aquacel since she did better with this during her last admission. We did not have this in the office last week. Continue compression therapy. Keystone antibiotic was ordered based on culture results. I asked her to bring this in once this arrives. She had venous reflux studies done 2 years ago that showed venous reflux in the saphenous vein to the left lower extremity. Due to the fact that it  did not take more than a week to develop multiple significant wounds to her left lower extremity after being completely healed. I recommended following back up with vein and vascular to see if she  would be a candidate for venous ablation. Procedures Wound #10 Pre-procedure diagnosis of Wound #10 is a Venous Leg Ulcer located on the Left,Posterior Lower Leg .Severity of Tissue Pre Debridement is: Fat layer exposed. There was a Excisional Skin/Subcutaneous Tissue Debridement with a total area of 81 sq cm performed by Geralyn Corwin, MD. With the following instrument(s): Curette to remove Viable and Non-Viable tissue/material. Material removed includes Subcutaneous Tissue and Slough and after achieving pain control using Lidocaine 2% T opical Gel. No specimens were taken. A time out was conducted at 09:53, prior to the start of the procedure. A Minimum amount of bleeding was controlled with Pressure. The procedure was tolerated well with a pain level of 0 throughout and a pain level of 0 following the procedure. Post Debridement Measurements: 9cm length x 9cm width x 0.2cm depth; 12.723cm^3 volume. Character of Wound/Ulcer Post Debridement is improved. Severity of Tissue Post Debridement is: Fat layer exposed. Post procedure Diagnosis Wound #10: Same as Pre-Procedure Wound #11 Pre-procedure diagnosis of Wound #11 is a Venous Leg Ulcer located on the Left,Lateral Lower Leg .Severity of Tissue Pre Debridement is: Fat layer exposed. There was a Selective/Open Wound Non-Viable Tissue Debridement with a total area of 1.6 sq cm performed by Geralyn Corwin, MD. With the following instrument(s): Curette to remove Viable and Non-Viable tissue/material. Material removed includes [facility] after achieving pain control using Lidocaine 2% Topical Gel. No specimens were taken. A time out was conducted at 09:53, prior to the start of the procedure. A Minimum amount of bleeding was controlled CLOTIEL, TROOP ([id number]) [id number] Page 13 of 16 with Pressure. The procedure was tolerated well with a pain level of 0 throughout and a pain level of 0 following the procedure. Post  Debridement Measurements: 1.6cm length x 1cm width x 0.2cm depth; 0.251cm^3 volume. Character of Wound/Ulcer Post Debridement is improved. Severity of Tissue Post Debridement is: Fat layer exposed. Post procedure Diagnosis Wound #11: Same as Pre-Procedure Wound #12 Pre-procedure diagnosis of Wound #12 is a Venous Leg Ulcer located on the Left,Medial Ankle .Severity of Tissue Pre Debridement is: Fat layer exposed. There was a Excisional Skin/Subcutaneous Tissue Debridement with a total area of 15 sq cm performed by Geralyn Corwin, MD. With the following instrument(s): Curette to remove Viable and Non-Viable tissue/material. Material removed includes Subcutaneous Tissue and Slough and after achieving pain control using Lidocaine 2% T opical Gel. No specimens were taken. A time out was conducted at 09:53, prior to the start of the procedure. A Minimum amount of bleeding was controlled with Pressure. The procedure was tolerated well with a pain level of 0 throughout and a pain level of 0 following the procedure. Post Debridement Measurements: 3cm length x 5cm width x 0.2cm depth; 2.356cm^3 volume. Character of Wound/Ulcer Post Debridement is improved. Severity of Tissue Post Debridement is: Fat layer exposed. Post procedure Diagnosis Wound #12: Same as Pre-Procedure Plan Follow-up Appointments: Return Appointment in 1 week. Nurse Visit as needed Home Health: Home Health Company: [facility] [phone] **Please direct any NON-WOUND related issues/requests for orders to patient's Primary Care Physician. **If current dressing causes regression in wound condition, may D/C ordered dressing product/s and apply Normal Saline Moist Dressing daily until next Wound Healing Center or Other MD appointment. **Notify Wound Healing Center of regression in  wound condition at [phone]. Bathing/ Shower/ Hygiene: May shower with wound dressing protected with water repellent cover or cast protector. Edema  Control - Lymphedema / Segmental Compressive Device / Other: Optional: One layer of unna paste to top of compression wrap (to act as an anchor). Elevate, Exercise Daily and Avoid Standing for Long Periods of Time. Elevate legs to the level of the heart and pump ankles as often as possible Elevate leg(s) parallel to the floor when sitting. DO YOUR BEST to sleep in the bed at night. DO NOT sleep in your recliner. Long hours of sitting in a recliner leads to swelling of the legs and/or potential wounds on your backside. Additional Orders / Instructions: Follow Nutritious Diet and Increase Protein Intake Consults ordered were: Vascular - venous reflux and intervention WOUND #10: - Lower Leg Wound Laterality: Left, Posterior Topical: Gentamicin 3 x Per Week/30 Days Discharge Instructions: in wound clinic only Topical: Mupirocin Ointment 3 x Per Week/30 Days Discharge Instructions: in wound clinic only Prim Dressing: AquacelAg Advantage Dressing, 2X2 (in/in) 3 x Per Week/30 Days ary Discharge Instructions: Apply to wound as directed Secondary Dressing: ABD Pad 5x9 (in/in) 3 x Per Week/30 Days Discharge Instructions: Cover with ABD pad Com pression Wrap: 3-LAYER WRAP - Profore Lite LF 3 Multilayer Compression Bandaging System 3 x Per Week/30 Days Discharge Instructions: Apply 3 multi-layer wrap as prescribed. WOUND #11: - Lower Leg Wound Laterality: Left, Lateral Topical: Gentamicin 3 x Per Week/30 Days Discharge Instructions: in wound clinic only Topical: Mupirocin Ointment 3 x Per Week/30 Days Discharge Instructions: in wound clinic only Prim Dressing: AquacelAg Advantage Dressing, 2X2 (in/in) 3 x Per Week/30 Days ary Discharge Instructions: Apply to wound as directed Secondary Dressing: ABD Pad 5x9 (in/in) 3 x Per Week/30 Days Discharge Instructions: Cover with ABD pad Com pression Wrap: 3-LAYER WRAP - Profore Lite LF 3 Multilayer Compression Bandaging System 3 x Per Week/30  Days Discharge Instructions: Apply 3 multi-layer wrap as prescribed. WOUND #12: - Ankle Wound Laterality: Left, Medial Topical: Gentamicin 3 x Per Week/30 Days Discharge Instructions: in wound clinic only Topical: Mupirocin Ointment 3 x Per Week/30 Days Discharge Instructions: in wound clinic only Prim Dressing: AquacelAg Advantage Dressing, 2X2 (in/in) 3 x Per Week/30 Days ary Discharge Instructions: Apply to wound as directed Secondary Dressing: ABD Pad 5x9 (in/in) 3 x Per Week/30 Days Discharge Instructions: Cover with ABD pad Com pression Wrap: 3-LAYER WRAP - Profore Lite LF 3 Multilayer Compression Bandaging System 3 x Per Week/30 Days Discharge Instructions: Apply 3 multi-layer wrap as prescribed. 1. In office sharp debridement 2. Aquacel with antibiotic ointment under 3 layer compressionooleft lower extremity 3. Continue compression stocking to the right lower extremity 4. Follow-up in 1 week Electronic Signature(s) Signed: 03/28/2023 11:12:29 AM By: Haynes Bast ([id number]) [id number] Page 14 of 16 Entered By: Geralyn Corwin on 03/28/2023 10:32:34 -------------------------------------------------------------------------------- ROS/PFSH Details Patient Name: Date of Service: Laura Santiago. 03/28/2023 9:15 A M Medical Record Number: [id number] Patient Account Number: [id number] Date of Birth/Sex: Treating RN: 1946-03-03 (77 y.o. Laura Santiago Primary Care Provider: Marisue Ivan Other Clinician: Referring Provider: Treating Provider/Extender: Greta Doom in Treatment: 88 Information Obtained From Patient Eyes Medical History: Negative for: Cataracts; Glaucoma; Optic Neuritis Ear/Nose/Mouth/Throat Medical History: Negative for: Chronic sinus problems/congestion; Middle ear problems Hematologic/Lymphatic Medical History: Negative for: Anemia; Hemophilia; Human  Immunodeficiency Virus; Lymphedema; Sickle Cell Disease Respiratory Medical History: Negative for: Aspiration; Asthma; Chronic Obstructive Pulmonary Disease (COPD); Pneumothorax; Sleep  Apnea; Tuberculosis Cardiovascular Medical History: Positive for: Hypertension Negative for: Angina; Arrhythmia; Congestive Heart Failure; Coronary Artery Disease; Deep Vein Thrombosis; Hypotension; Myocardial Infarction; Peripheral Arterial Disease; Peripheral Venous Disease; Phlebitis; Vasculitis Gastrointestinal Medical History: Negative for: Cirrhosis ; Colitis; Crohns; Hepatitis A; Hepatitis B; Hepatitis C Endocrine Medical History: Negative for: Type I Diabetes; Type II Diabetes Genitourinary Medical History: Negative for: End Stage Renal Disease Immunological Medical History: Negative for: Lupus Erythematosus; Raynauds; Scleroderma Integumentary (Skin) Medical History: Negative for: History of Burn; History of pressure wounds Musculoskeletal Medical History: Positive for: Osteoarthritis Negative for: Gout; Rheumatoid Arthritis; Osteomyelitis Oncologic Medical History: Positive for: Received Chemotherapy; Received Radiation MILEE, QUALLS ([id number]) [id number] Page 15 of 16 Past Medical History Notes: breast cancer Immunizations Pneumococcal Vaccine: Received Pneumococcal Vaccination: No Implantable Devices None Family and Social History 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 03/28/2023 Medical Record Number: [id number] Patient Account Number: [id number] Date of Birth/Sex: Treating RN: 04/03/1946 (77 y.o. Laura Santiago Primary Care Provider:  Marisue Ivan Other Clinician: Referring Provider: Treating Provider/Extender: Greta Doom in Treatment: 18 Diagnosis Coding ICD-10 Codes Code Description [phone] 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 I87.311 Chronic venous hypertension (idiopathic) with ulcer of right lower extremity I87.2 Venous insufficiency (chronic) (peripheral) Z79.01 Long term (current) use of anticoagulants I10 Essential (primary) hypertension 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 Cellulitis of right lower limb S81.801A Unspecified open wound, right lower leg, initial encounter Facility Procedures : CPT4 Code: [id number] Description: 11042 - DEB SUBQ TISSUE 20 SQ CM/< ICD-10 Diagnosis Description 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se Modifier: d Quantity: 1 : CPT4 Code: [id number] Description: 11045 - DEB SUBQ TISS EA ADDL 20CM ICD-10 Diagnosis Description 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se Modifier: d Quantity: 4 : CPT4 Code: [id number] Description: 97597 - DEBRIDE WOUND 1ST 20 SQ CM OR < ICD-10 Diagnosis Description 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se Modifier: d Quantity: 1 Physician Procedures : CPT4 Code Description Modifier [id number] 11042 - WC PHYS SUBQ TISS 20 SQ CM ICD-10 Diagnosis Description 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 AMBERLI, RUEGG ([id number])  [id number]  Q25.956 Non-pressure chronic ulcer of other part of left lower leg with fat layer exposed Quantity: 1 817.pdf Page 16 of 16 : [id number] 11045 - WC PHYS SUBQ TISS EA ADDL 20 CM ICD-10 Diagnosis Description 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sed Quantity: 4 : [id number] 97597 - WC PHYS DEBR WO ANESTH 20 SQ CM ICD-10 Diagnosis Description 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sed Quantity: 1 Electronic Signature(s) Signed: 03/28/2023 11:12:29 AM By: Geralyn Corwin DO Entered By: Geralyn Corwin  on 03/28/2023 10:33:02

## 2023-04-02 ENCOUNTER — Inpatient Hospital Stay: Payer: Medicare Other

## 2023-04-02 ENCOUNTER — Inpatient Hospital Stay: Payer: Medicare Other | Attending: Oncology | Admitting: Oncology

## 2023-04-02 ENCOUNTER — Encounter: Payer: Self-pay | Admitting: Oncology

## 2023-04-02 VITALS — BP 147/90 | HR 83 | Temp 98.4°F | Resp 18 | Ht 64.0 in | Wt 144.0 lb

## 2023-04-02 DIAGNOSIS — Z5111 Encounter for antineoplastic chemotherapy: Secondary | ICD-10-CM | POA: Insufficient documentation

## 2023-04-02 DIAGNOSIS — C50919 Malignant neoplasm of unspecified site of unspecified female breast: Secondary | ICD-10-CM

## 2023-04-02 DIAGNOSIS — C7951 Secondary malignant neoplasm of bone: Secondary | ICD-10-CM | POA: Insufficient documentation

## 2023-04-02 DIAGNOSIS — Z5112 Encounter for antineoplastic immunotherapy: Secondary | ICD-10-CM | POA: Insufficient documentation

## 2023-04-02 DIAGNOSIS — C50911 Malignant neoplasm of unspecified site of right female breast: Secondary | ICD-10-CM | POA: Insufficient documentation

## 2023-04-02 DIAGNOSIS — Z800 Family history of malignant neoplasm of digestive organs: Secondary | ICD-10-CM | POA: Insufficient documentation

## 2023-04-02 DIAGNOSIS — Z7189 Other specified counseling: Secondary | ICD-10-CM

## 2023-04-02 DIAGNOSIS — Z170 Estrogen receptor positive status [ER+]: Secondary | ICD-10-CM | POA: Insufficient documentation

## 2023-04-02 DIAGNOSIS — Z923 Personal history of irradiation: Secondary | ICD-10-CM | POA: Insufficient documentation

## 2023-04-02 DIAGNOSIS — Z8042 Family history of malignant neoplasm of prostate: Secondary | ICD-10-CM | POA: Insufficient documentation

## 2023-04-02 DIAGNOSIS — Z808 Family history of malignant neoplasm of other organs or systems: Secondary | ICD-10-CM | POA: Diagnosis not present

## 2023-04-02 DIAGNOSIS — C779 Secondary and unspecified malignant neoplasm of lymph node, unspecified: Secondary | ICD-10-CM | POA: Insufficient documentation

## 2023-04-02 MED ORDER — HEPARIN SOD (PORK) LOCK FLUSH 100 UNIT/ML IV SOLN
500.0000 [IU] | Freq: Once | INTRAVENOUS | Status: AC | PRN
  Administered 2023-04-02: 500 [IU]
  Filled 2023-04-02: qty 5

## 2023-04-02 MED ORDER — SODIUM CHLORIDE 0.9% FLUSH
10.0000 mL | Freq: Once | INTRAVENOUS | Status: AC | PRN
  Administered 2023-04-02: 10 mL
  Filled 2023-04-02: qty 10

## 2023-04-02 NOTE — Progress Notes (Signed)
Patient states that she needs refill on lorazepam.

## 2023-04-02 NOTE — Progress Notes (Signed)
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  She had a screening mammogram in September 2014 which showed 2.1 x 2.3 x 1.8 cm irregular mass in her right breast.  It was ER 10% positive PR negative and HER2 positive +3.  She received neoadjuvant chemotherapy with Taxol Herceptin and Perjeta for 4 cycles followed by dose dense AC/Herceptin x4 which she completed in March 2015.  She had a right lumpectomy on 04/03/2014 which showed scant residual invasive ductal carcinoma [id number].  She completed 1 year of adjuvant Herceptin chemotherapy and also completed adjuvant radiation treatment.  She was recommended anastrozole which she took on and off starting November 2015 and stopped sometime in 2020.   She was then hospitalized with neck pain and was found to have lytic lesions involving C5-C6 with pathological vertebral fractures.  She underwent radiation treatment to this area.  Image guided biopsy of the L1 vertebral body showed metastatic carcinoma consistent with breast origin ER 10% PR 0% and HER2 amplified ratio 5.1 average HER2 signal number per cell 15.0 average CEP 17 signals number per cell 3.0.  Baseline  echocardiogram on 05/02/2021 showed a normal EF of 62% she was recommended Taxol Herceptin and Perjeta which she received for 2 cycles at [facility] until June 10, 2021   She has chronic pain from her bone metastases for which she is currently on oxycodone 10 mg every 4 hours as needed and 12 mcg fentanyl patch.  She was seeing pain clinic when she was living in [place].   Patient has also been on Zometa when she was in [place] but she does have some ongoing dental issues.  She has received Xgeva in the past as well.  Her last Rivka Barbara was in July 2021.  Last PET scan was on 03/21/2021 which showed diffuse osseous metastatic disease involving the head neck chest abdomen and pelvis and spine.  Left lung apex hypermetabolic nodule and multiple hypermetabolic liver lesions concerning for disease involvement.  Enlarged hypermetabolic left inguinal lymph nodes along with hypermetabolic external iliac and left supraclavicular lymph nodes   Patient is now moved to [place] to be close to her daughter.  She lives in an independent living.  Patient received Taxol Herceptin and Perjeta for about a year and was subsequently on Herceptin and Perjeta maintenance.  She took that until November 2023 and following that treatments were on hold due to drop in ejection fraction  Interval history-today patient is doing well and feels at her baseline.  She is able to walk without any significant shortness of breath.  She has chronicLower extremity wound and swelling for which she is also seeing dermatology and was diagnosed with stasis dermatitis and lipo dermatosclerosis.  Her daughter is in the process of moving to [place] in 2 months and  patient is unsure what she would do  ECOG PS- 2 Pain scale- 0 Opioid associated constipation- no  Review of systems- Review of Systems  Constitutional:  Negative for chills, fever, malaise/fatigue and weight loss.  HENT:  Negative for congestion, ear discharge and nosebleeds.   Eyes:  Negative  for blurred vision.  Respiratory:  Negative for cough, hemoptysis, sputum production, shortness of breath and wheezing.   Cardiovascular:  Negative for chest pain, palpitations, orthopnea and claudication.  Gastrointestinal:  Negative for abdominal pain, blood in stool, constipation, diarrhea, heartburn, melena, nausea and vomiting.  Genitourinary:  Negative for dysuria, flank pain, frequency, hematuria and urgency.  Musculoskeletal:  Negative for back pain, joint pain and myalgias.  Skin:  Negative for rash.  Neurological:  Negative for dizziness, tingling, focal weakness, seizures, weakness and headaches.  Endo/Heme/Allergies:  Does not bruise/bleed easily.  Psychiatric/Behavioral:  Negative for depression and suicidal ideas. The patient does not have insomnia.       Allergies  Allergen Reactions   Other Diarrhea and Nausea And Vomiting    Pt has had episodes of nausea, vomiting and diarrhea right after receiving IV technetium for NM study on two occasions.   Technetium Tc 27m Medronate Diarrhea and Nausea And Vomiting    Pt has had episodes of nausea, vomiting and diarrhea right after receiving IV technetium for NM study on two occasions.    Corticosteroids Other (See Comments)    Pt trf from [place] and per primary md for her cancer tx. Notes that it causes agitation intolerance   Sulfa Antibiotics Other (See Comments)    Pt moved from [place] and in MD notes she has allergy but we do not know reactions when taking the drug   Celebrex [Celecoxib] Rash     Past Medical History:  Diagnosis Date   ADHD (attention deficit hyperactivity disorder)    in [place], no date on md note   Anemia    IDA 11/26/2019, Anemia in stage 4 chronic kidney disease (HCC) 09/03/2020   Arthritis    osteoarthritis right knee 09/30/2014   Breast cancer 10/05/2013   in [place], [place] -, Her 2 is 3+   Cancer related pain 03/28/2021   in [place], md notes spine mets   cervical compression fracture 03/18/2021   in  [place]   DVT of lower extremity, bilateral 03/30/2014   in [place]   Generalized muscle weakness 03/31/2016   in [place]   GERD (gastroesophageal reflux disease) 05/15/2013   per md in [place]   Hyperparathyroidism, secondary 04/25/2018   in [place]   Hypertension 02/20/2016   info from MD in [place]   Memory loss 02/05/2015   in [place]   Metabolic acidosis 01/27/2018   in [place]   Metabolic syndrome 02/20/2016   in [place]   Osteopenia after menopause 03/29/2016   in [place]   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   in [place]   Stasis ulcer of left lower extremity 03/29/2016   in [place]     Past Surgical History:  Procedure Laterality Date   CESAREAN SECTION     unknown   fibroid removed  N/A    in [place] - unknown date   IR FLUORO GUIDE CV LINE LEFT  07/27/2021   IR PORT REPAIR CENTRAL VENOUS ACCESS DEVICE Left    In [place]   MOHS SURGERY N/A 02/25/2014   in [place]   ovary removed      unknown   PORTA CATH INSERTION N/A 02/13/2022  Procedure: PORTA CATH INSERTION;  Surgeon: Annice Needy, MD;  Location: [facility] INVASIVE CV LAB;  Service: Cardiovascular;  Laterality: N/A;   REMV CATARACT EXTRACAP,INSERT LENS Bilateral  Bilateral 09/05/2012   in [place]   TONSILLECTOMY     unknown    LUMPECTOMY Right 04/03/2014   in [place]    Social History   Socioeconomic History   Marital status: Divorced    Spouse name: Not on file   Number of children: Not on file   Years of education: Not on file   Highest education level: Not on file  Occupational History   Occupation: retired Chief Financial Officer    Comment: In [employer]  Tobacco Use   Smoking status: Never   Smokeless tobacco: Never  Vaping Use   Vaping Use: Never used  Substance and Sexual Activity   Alcohol use: Not Currently   Drug use: Not Currently   Sexual activity: Not Currently  Other Topics Concern   Not on file  Social History Narrative   Not on file   Social Determinants of Health   Financial Resource Strain: Low Risk  (03/17/2022)    Overall Financial Resource Strain (CARDIA)    Difficulty of Paying Living Expenses: Not hard at all  Food Insecurity: No Food Insecurity (03/17/2022)   Hunger Vital Sign    Worried About Running Out of Food in the Last Year: Never true    Ran Out of Food in the Last Year: Never true  Transportation Needs: No Transportation Needs (03/17/2022)   PRAPARE - Administrator, Civil Service (Medical): No    Lack of Transportation (Non-Medical): No  Physical Activity: Inactive (03/17/2022)   Exercise Vital Sign    Days of Exercise per Week: 0 days    Minutes of Exercise per Session: 0 min  Stress: No Stress Concern Present (03/17/2022)   [employer] of Occupational Health - Occupational Stress Questionnaire    Feeling of Stress : Only a little  Social Connections: Socially Isolated (03/17/2022)   Social Connection and Isolation Panel [NHANES]    Frequency of Communication with Friends and Family: Twice a week    Frequency of Social Gatherings with Friends and Family: Twice a week    Attends Religious Services: Never    Database administrator or Organizations: No    Attends Banker Meetings: Never    Marital Status: Widowed  Intimate Partner Violence: Not At Risk (03/17/2022)   Humiliation, Afraid, Rape, and Kick questionnaire    Fear of Current or Ex-Partner: No    Emotionally Abused: No    Physically Abused: No    Sexually Abused: No    Family History  Problem Relation Age of Onset   Pancreatic cancer Mother    Stroke Father    Diabetes Father    Hypertension Father    Heart disease Father    Skin cancer Father    Varicose Veins Father    Skin cancer Brother    Cancer - Prostate Brother      Current Outpatient Medications:    amphetamine-dextroamphetamine (ADDERALL) 20 MG tablet, Take 1 tablet (20 mg total) by mouth daily., Disp: 30 tablet, Rfl: 0   calcium citrate (CALCITRATE - DOSED IN MG ELEMENTAL CALCIUM) 950 (200 Ca) MG tablet, Take 200 mg of elemental  calcium by mouth daily., Disp: , Rfl:    clindamycin (CLEOCIN) 300 MG capsule, Take 300 mg by mouth every 6 (six) hours., Disp: , Rfl:  empagliflozin (JARDIANCE) 10 MG TABS tablet, Take 1 tablet (10 mg total) by mouth daily before breakfast., Disp: 30 tablet, Rfl: 11   fentaNYL (DURAGESIC) 25 MCG/HR, PLACE 1 PATCH ONTO THE SKIN EVERY THREE DAYS AS DIRECTED., Disp: 10 patch, Rfl: 0   fluocinonide (LIDEX) 0.05 % external solution, Apply once or twice daily to scalp as needed for itching.  Avoid applying to face, groin, and axilla., Disp: 60 mL, Rfl: 2   gabapentin (NEURONTIN) 100 MG capsule, TAKE 1 CAPSULE BY MOUTH 3 TIMES DAILY, Disp: 90 capsule, Rfl: 1   hydrocortisone 2.5 % cream, Apply 1 to 2 times daily for itchy rash at face, scalp, ears, and forehead., Disp: 30 g, Rfl: 3   hydrOXYzine (ATARAX) 25 MG tablet, Take by mouth., Disp: , Rfl:    ketoconazole (NIZORAL) 2 % cream, APPLY ON FEET AND BETWEEN TOES EVERY EVENING AS DIRECTED., Disp: 60 g, Rfl: 2   ketoconazole (NIZORAL) 2 % shampoo, apply every other day  massage into scalp and leave in for 5 minutes before rinsing out, Disp: 120 mL, Rfl: 3   lisinopril (ZESTRIL) 20 MG tablet, Take 1 tablet (20 mg total) by mouth daily., Disp: 30 tablet, Rfl: 3   LORazepam (ATIVAN) 0.5 MG tablet, Take 1 tablet (0.5 mg total) by mouth daily as needed for anxiety., Disp: 30 tablet, Rfl: 0   metoprolol succinate (TOPROL-XL) 25 MG 24 hr tablet, Take 1 tablet (25 mg total) by mouth daily. Take with or immediately following a meal., Disp: 90 tablet, Rfl: 3   Multiple Vitamin (MULTI-VITAMIN) tablet, Take 1 tablet by mouth daily., Disp: , Rfl:    naloxone (NARCAN) nasal spray 4 mg/0.1 mL, SPRAY 1 SPRAY INTO ONE NOSTRIL AS DIRECTED FOR OPIOID OVERDOSE (TURN PERSON ON SIDE AFTER DOSE. IF NO RESPONSE IN 2-3 MINUTES OR PERSON RESPONDS BUT RELAPSES, REPEAT USING A NEW SPRAY DEVICE AND SPRAY INTO THE OTHER NOSTRIL. CALL 911 AFTER USE.) * EMERGENCY USE ONLY *, Disp: 1 each,  Rfl: 0   NON FORMULARY, Apply 1 Dose topically daily. Pt mixes up piperacillim and tazobactam and puts it on the wounds of left leg, Disp: , Rfl:    ondansetron (ZOFRAN) 8 MG tablet, Take 1 tablet (8 mg total) by mouth as needed for nausea or vomiting., Disp: 30 tablet, Rfl: 0   Oxycodone HCl 10 MG TABS, Take 1.5 tablets (15 mg total) by mouth every 6 (six) hours as needed., Disp: 90 tablet, Rfl: 0   rivaroxaban (XARELTO) 20 MG TABS tablet, Take 1 tablet (20 mg total) by mouth daily with supper., Disp: 30 tablet, Rfl: 5   sodium hypochlorite (DAKIN'S 1/4 STRENGTH) 0.125 % SOLN, Apply 1 application topically as directed. Moisten gauze with solution and wrap wound, Disp: , Rfl:    DULoxetine (CYMBALTA) 30 MG capsule, Take by mouth., Disp: , Rfl:  No current facility-administered medications for this visit.  Facility-Administered Medications Ordered in Other Visits:    heparin lock flush 100 UNIT/ML injection, , , ,   Physical exam:  Vitals:   04/02/23 1322 04/02/23 1326  BP: (!) 150/83 (!) 147/90  Pulse: 79 83  Resp: 18   Temp: 98.4 F (36.9 C)   TempSrc: Tympanic   SpO2: 100%   Weight: 144 lb (65.3 kg)   Height:  (1.626 m)    Physical Exam Cardiovascular:     Rate and Rhythm: Normal rate and regular rhythm.     Heart sounds: Normal heart sounds.  Pulmonary:  Effort: Pulmonary effort is normal.     Breath sounds: Normal breath sounds.  Abdominal:     General: Bowel sounds are normal.     Palpations: Abdomen is soft.  Musculoskeletal:     Comments: Compression stocking in place over left lower extremity  Skin:    General: Skin is warm and dry.  Neurological:     Mental Status: She is alert and oriented to person, place, and time.         Latest Ref Rng & Units 02/07/2023   11:15 AM  CMP  Glucose 70 - 99 mg/dL 409   BUN 8 - 23 mg/dL 36   Creatinine 8.11 - 1.00 mg/dL 9.14   Sodium 782 - 956 mmol/L 138   Potassium 3.5 - 5.1 mmol/L 4.5   Chloride 98 - 111 mmol/L  105   CO2 22 - 32 mmol/L 25   Calcium 8.9 - 10.3 mg/dL 8.5   Total Protein 6.5 - 8.1 g/dL 7.2   Total Bilirubin 0.3 - 1.2 mg/dL 0.5   Alkaline Phos 38 - 126 U/L 65   AST 15 - 41 U/L 19   ALT 0 - 44 U/L 11       Latest Ref Rng & Units 02/07/2023   11:15 AM  CBC  WBC 4.0 - 10.5 K/uL 4.9   Hemoglobin 12.0 - 15.0 g/dL 21.3   Hematocrit 08.6 - 46.0 % 35.4   Platelets 150 - 400 K/uL 193     No images are attached to the encounter.  ECHOCARDIOGRAM COMPLETE  Result Date: 03/28/2023    ECHOCARDIOGRAM REPORT   Patient Name:   RASHIA MCKESSON Date of Exam: 03/28/2023 Medical Rec #:  [id number]        Height:       64.0 in Accession #:    [id number]       Weight:       140.0 lb Date of Birth:  Jan 20, 1946        BSA:          1.681 m Patient Age:    22 years         BP:           153/84 mmHg Patient Gender: F                HR:           76 bpm. Exam Location:  [place] Procedure: 2D Echo, 3D Echo, Cardiac Doppler, Color Doppler and Strain Analysis Indications:    I42.9 Cardiomyopathy (unspecified)  History:        Patient has prior history of Echocardiogram examinations, most                 recent 11/30/2022. Cardiomyopathy; Risk Factors:h/o                 chemotherapy, Non-Smoker and Hypertension.  Sonographer:    Quentin Ore RDMS, RVT, RDCS Referring Phys: [id number] BRIDGETTE CHRISTOPHER IMPRESSIONS  1. Left ventricular ejection fraction, by estimation, is 55 to 60%. Left ventricular ejection fraction by 2D MOD biplane is 57.9 %. The left ventricle has normal function. The left ventricle has no regional wall motion abnormalities. Left ventricular diastolic parameters are consistent with Grade I diastolic dysfunction (impaired relaxation). The average left ventricular global longitudinal strain is -20.0 %. The global longitudinal strain is normal.  2. Right ventricular systolic function is normal. The right ventricular size is normal.  3. Left atrial size was mildly dilated.  4. The mitral valve is  normal in structure. Trivial mitral valve regurgitation.  5. The aortic valve is grossly normal. Aortic valve regurgitation is not visualized.  6. The inferior vena cava is normal in size with <50% respiratory variability, suggesting right atrial pressure of 8 mmHg. FINDINGS  Left Ventricle: Left ventricular ejection fraction, by estimation, is 55 to 60%. Left ventricular ejection fraction by 2D MOD biplane is 57.9 %. The left ventricle has normal function. The left ventricle has no regional wall motion abnormalities. The average left ventricular global longitudinal strain is -20.0 %. The global longitudinal strain is normal. 3D ejection fraction reviewed and evaluated as part of the interpretation. Alternate measurement of EF is felt to be most reflective of LV function.  The left ventricular internal cavity size was normal in size. There is no left ventricular hypertrophy. Left ventricular diastolic parameters are consistent with Grade I diastolic dysfunction (impaired relaxation). Right Ventricle: The right ventricular size is normal. No increase in right ventricular wall thickness. Right ventricular systolic function is normal. Left Atrium: Left atrial size was mildly dilated. Right Atrium: Right atrial size was normal in size. Pericardium: There is no evidence of pericardial effusion. Mitral Valve: The mitral valve is normal in structure. Trivial mitral valve regurgitation. Tricuspid Valve: The tricuspid valve is normal in structure. Tricuspid valve regurgitation is mild. Aortic Valve: The aortic valve is grossly normal. Aortic valve regurgitation is not visualized. Aortic valve mean gradient measures 7.0 mmHg. Aortic valve peak gradient measures 13.4 mmHg. Aortic valve area, by VTI measures 2.61 cm. Pulmonic Valve: The pulmonic valve was normal in structure. Pulmonic valve regurgitation is not visualized. Aorta: The aortic root is normal in size and structure. Venous: The inferior vena cava is normal in size  with less than 50% respiratory variability, suggesting right atrial pressure of 8 mmHg. IAS/Shunts: No atrial level shunt detected by color flow Doppler.  LEFT VENTRICLE PLAX 2D                        Biplane EF (MOD) LVIDd:         4.30 cm         LV Biplane EF:   Left LVIDs:         3.00 cm                          ventricular LV PW:         0.90 cm                          ejection LV IVS:        0.80 cm                          fraction by LVOT diam:     2.00 cm                          2D MOD LV SV:         92                               biplane is LV SV Index:   55  57.9 %. LVOT Area:     3.14 cm                                Diastology                                LV e' medial:    6.74 cm/s LV Volumes (MOD)               LV E/e' medial:  12.2 LV vol d, MOD    105.0 ml      LV e' lateral:   12.30 cm/s A2C:                           LV E/e' lateral: 6.7 LV vol d, MOD    123.0 ml A4C:                           2D LV vol s, MOD    43.3 ml       Longitudinal A2C:                           Strain LV vol s, MOD    53.3 ml       2D Strain GLS  -20.0 % A4C:                           Avg: LV SV MOD A2C:   61.7 ml LV SV MOD A4C:   123.0 ml LV SV MOD BP:    66.2 ml                                3D Volume EF:                                3D EF:        54 %                                LV EDV:       140 ml                                LV ESV:       64 ml                                LV SV:        76 ml RIGHT VENTRICLE             IVC RV S prime:     16.10 cm/s  IVC diam: 2.00 cm TAPSE (M-mode): 2.0 cm LEFT ATRIUM             Index        RIGHT ATRIUM           Index LA diam:        3.90 cm 2.32 cm/m   RA Area:     12.30  cm LA Vol (A2C):   59.0 ml 35.09 ml/m  RA Volume:   27.10 ml  16.12 ml/m LA Vol (A4C):   37.5 ml 22.31 ml/m LA Biplane Vol: 48.5 ml 28.85 ml/m  AORTIC VALVE                     PULMONIC VALVE AV Area (Vmax):    2.54 cm      PV Vmax:       1.45 m/s AV Area  (Vmean):   2.56 cm      PV Peak grad:  8.4 mmHg AV Area (VTI):     2.61 cm AV Vmax:           183.00 cm/s AV Vmean:          120.000 cm/s AV VTI:            0.355 m AV Peak Grad:      13.4 mmHg AV Mean Grad:      7.0 mmHg LVOT Vmax:         148.00 cm/s LVOT Vmean:        97.800 cm/s LVOT VTI:          0.294 m LVOT/AV VTI ratio: 0.83  AORTA Ao Root diam: 3.40 cm Ao Arch diam: 2.0 cm MITRAL VALVE                TRICUSPID VALVE MV Area (PHT): 3.42 cm     TR Peak grad:   34.8 mmHg MV Decel Time: 222 msec     TR Vmax:        295.00 cm/s MV E velocity: 82.30 cm/s MV A velocity: 122.00 cm/s  SHUNTS MV E/A ratio:  0.67         Systemic VTI:  0.29 m                             Systemic Diam: 2.00 cm Debbe Odea MD Electronically signed by Debbe Odea MD Signature Date/Time: 03/28/2023/12:50:36 PM    Final      Assessment and plan- Patient is a 77 y.o. 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  Patient was on maintenance Herceptin and Perjeta with last dose given in October 2023.  She is here to discuss echocardiogram results and further management  Patient last received Herceptin and Perjeta on 10/10/2022.  Subsequently her treatment was on hold due to drop in her ejection fraction.  EF was reported to be 40 to 45% in November 2023 and 35 require 40% in December 2023.  She was seen by cardiology as well and had a repeat echocardiogram in April 2024 which showed improvement in her ejection fraction back up to 55 to 60%.  I therefore think will be reasonable to restart Herceptin and Perjeta given that her heart functions have normalized.  Patient has been asymptomatic during this whole process.  She will proceed with Herceptin and Perjeta sometime this week and I will see her back in 3 weeks for the following cycle.  Will plan to get CT chest abdomen pelvis with contrast and bone scan prior.  She had scans in January 2024 which showed stable evidence  of bone mets as well as lymph node metastases.  I will discuss restarting Zometa with her as well at that time.  Patient mainland of moving to [place] state since her daughter has found  a job there and she has no one else here in [place].  If she moves to [place] we will have to find oncology care for her closer to where she would be living.  Patient verbalized understanding of the plan and is willing to restart treatments at this time   Visit Diagnosis 1. Primary malignant neoplasm of breast with metastasis   2. Goals of care, counseling/discussion      Dr. Owens Shark, MD, MPH [facility] [phone] 04/02/2023 2:20 PM

## 2023-04-04 ENCOUNTER — Encounter (HOSPITAL_BASED_OUTPATIENT_CLINIC_OR_DEPARTMENT_OTHER): Payer: Medicare Other | Admitting: Internal Medicine

## 2023-04-04 ENCOUNTER — Encounter: Payer: Self-pay | Admitting: Oncology

## 2023-04-04 DIAGNOSIS — L97822 Non-pressure chronic ulcer of other part of left lower leg with fat layer exposed: Secondary | ICD-10-CM

## 2023-04-04 DIAGNOSIS — I87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: Secondary | ICD-10-CM | POA: Diagnosis not present

## 2023-04-05 ENCOUNTER — Other Ambulatory Visit: Payer: Self-pay | Admitting: *Deleted

## 2023-04-05 DIAGNOSIS — R530 Neoplastic (malignant) related fatigue: Secondary | ICD-10-CM

## 2023-04-05 DIAGNOSIS — C7951 Secondary malignant neoplasm of bone: Secondary | ICD-10-CM

## 2023-04-05 NOTE — Progress Notes (Signed)
ADALENA, ABDULLA ([id number]) [id number] Page 1 of 11 Visit Report for 04/04/2023 Arrival Information Details Patient Name: Date of Service: Laura Santiago. 04/04/2023 9:15 A Santiago Medical Record Number: [id number] Patient Account Number: [id number] Date of Birth/Sex: Treating RN: 18-Mar-1946 (77 y.o. Laura Santiago Primary Care Laura Santiago: Laura Santiago Other Clinician: Betha Santiago Referring Laura Santiago: Treating Laura Santiago/Extender: Laura Santiago in Treatment: 38 Visit Information History Since Last Visit All ordered tests and consults were completed: No Patient Arrived: Laura Santiago Added or deleted any medications: No Arrival Time: 09:26 Any new allergies or adverse reactions: No Transfer Assistance: None Had a fall or experienced change in No Patient Identification Verified: Yes activities of daily living that may affect Secondary Verification Process Completed: Yes risk of falls: Patient Requires Transmission-Based No Signs or symptoms of abuse/neglect since last visito No Precautions: Hospitalized since last visit: No Patient Has Alerts: Yes Implantable device outside of the clinic excluding No Patient Alerts: PT HAS SERVICE cellular tissue based products placed in the center ANIMAL since last visit: ABI 07/11/21 Has Dressing in Place as Prescribed: Yes R) 1.16 L) 1.27 Has Compression in Place as Prescribed: Yes Pain Present Now: No Electronic Signature(s) Signed: 04/04/2023 4:32:09 PM By: Laura Santiago Entered By: Laura Santiago on 04/04/2023 09:28:07 -------------------------------------------------------------------------------- Clinic Level of Care Assessment Details Patient Name: Date of Service: Laura Santiago. 04/04/2023 9:15 A Santiago Medical Record Number: [id number] Patient Account Number: [id number] Date of Birth/Sex: Treating RN: 02-10-1946 (77 y.o. Laura Santiago Primary Care Laura Santiago: Laura Santiago Other Clinician: Betha Santiago Referring Laura Santiago: Treating Laura Santiago/Extender: Laura Santiago in Treatment: 35 Clinic Level of Care Assessment Items TOOL 1 Quantity Score []  - 0 Use when EandM and Procedure is performed on INITIAL visit ASSESSMENTS - Nursing Assessment / Reassessment []  - 0 General Physical Exam (combine w/ comprehensive assessment (listed just below) when performed on new pt. evals) []  - 0 Comprehensive Assessment (HX, ROS, Risk Assessments, Wounds Hx, etc.) ASSESSMENTS - Wound and Skin Assessment / Reassessment []  - 0 Dermatologic / Skin Assessment (not related to wound area) ASSESSMENTS - Ostomy and/or Continence Assessment and Care []  - 0 Incontinence Assessment and Management []  - 0 Ostomy Care Assessment and Management (repouching, etc.) PROCESS - Coordination of Care []  - 0 Simple Patient / Family Education for ongoing care []  - 0 Complex (extensive) Patient / Family Education for ongoing care []  - 0 Staff obtains Chiropractor, Records, T Results / Process Orders est []  - 0 Staff telephones HHA, Nursing Homes / Clarify orders / etc []  - 0 Routine Transfer to another Facility (non-emergent condition) []  - 0 Routine Hospital Admission (non-emergent condition) Laura Santiago, Laura Santiago ([id number]) [id number] Page 2 of 11 []  - 0 New Admissions / Manufacturing engineer / Ordering NPWT Apligraf, etc. , []  - 0 Emergency Hospital Admission (emergent condition) PROCESS - Special Needs []  - 0 Pediatric / Minor Patient Management []  - 0 Isolation Patient Management []  - 0 Hearing / Language / Visual special needs []  - 0 Assessment of Community assistance (transportation, D/C planning, etc.) []  - 0 Additional assistance / Altered mentation []  - 0 Support Surface(s) Assessment (bed, cushion, seat, etc.) INTERVENTIONS - Miscellaneous []  - 0 External ear exam []  - 0 Patient Transfer (multiple  staff / Nurse, adult / Similar devices) []  - 0 Simple Staple / Suture removal (25 or less) []  - 0 Complex Staple / Suture removal (26 or more) []  - 0 Hypo/Hyperglycemic Management (do not  check if billed separately) []  - 0 Ankle / Brachial Index (ABI) - do not check if billed separately Has the patient been seen at the hospital within the last three years: Yes Total Score: 0 Level Of Care: ____ Electronic Signature(s) Signed: 04/04/2023 4:32:09 PM By: Laura Santiago Entered By: Laura Santiago on 04/04/2023 10:02:51 -------------------------------------------------------------------------------- Compression Therapy Details Patient Name: Date of Service: Laura Muscat NNIE Santiago. 04/04/2023 9:15 A Santiago Medical Record Number: [id number] Patient Account Number: [id number] Date of Birth/Sex: Treating RN: 11-17-1946 (77 y.o. Laura Santiago Primary Care Amare Kontos: Laura Santiago Other Clinician: Betha Santiago Referring Montoya Brandel: Treating Laura Santiago/Extender: Laura Santiago in Treatment: 6 Compression Therapy Performed for Wound Assessment: Wound #10 Left,Posterior Lower Leg Performed By: Laura Santiago, Laura Santiago, Compression Type: Three Layer Pre Treatment ABI: 1.5 Post Procedure Diagnosis Same as Pre-procedure Electronic Signature(s) Signed: 04/04/2023 4:32:09 PM By: Laura Santiago Entered By: Laura Santiago on 04/04/2023 10:01:37 -------------------------------------------------------------------------------- Encounter Discharge Information Details Patient Name: Date of Service: Laura Muscat NNIE Santiago. 04/04/2023 9:15 A Santiago Medical Record Number: [id number] Patient Account Number: [id number] Date of Birth/Sex: Treating RN: 1946-03-16 (77 y.o. Laura Santiago Primary Care Arhaan Chesnut: Laura Santiago Other Clinician: Betha Santiago Referring Laura Santiago: Treating Laura Santiago/Extender: Laura Santiago in Treatment: 64 Encounter Discharge Information  Items Post Procedure Vitals Discharge Condition: Stable Temperature (F): 97.6 Ambulatory Status: Wheelchair Pulse (bpm): 101 Discharge Destination: Home Respiratory Rate (breaths/min): [address] Laura Santiago ([id number]) [id number] Page 3 of 11 Transportation: Other Blood Pressure (mmHg): 154/78 Accompanied By: self Schedule Follow-up Appointment: Yes Clinical Summary of Care: Electronic Signature(s) Signed: 04/04/2023 4:32:09 PM By: Laura Santiago Entered By: Laura Santiago on 04/04/2023 13:17:33 -------------------------------------------------------------------------------- Lower Extremity Assessment Details Patient Name: Date of Service: Laura Santiago. 04/04/2023 9:15 A Santiago Medical Record Number: [id number] Patient Account Number: [id number] Date of Birth/Sex: Treating RN: Sep 26, 1946 (77 y.o. [name], [name] Primary Care Amory Zbikowski: Laura Santiago Other Clinician: Betha Santiago Referring Roy Tokarz: Treating Wil Slape/Extender: Laura Santiago in Treatment: 46 Edema Assessment Assessed: [Left: Yes] [Right: No] Edema: [Left: Ye] [Right: s] Calf Left: Right: Point of Measurement: 36 cm From Medial Instep 40.7 cm Ankle Left: Right: Point of Measurement: 12 cm From Medial Instep 22.8 cm Vascular Assessment Pulses: Dorsalis Pedis Palpable: [Left:Yes] Electronic Signature(s) Signed: 04/04/2023 4:32:09 PM By: Laura Santiago Signed: 04/04/2023 4:54:10 PM By: Elliot Gurney, BSN, RN, CWS, [name] RN, BSN Entered By: Laura Santiago on 04/04/2023 09:45:17 -------------------------------------------------------------------------------- Multi Wound Chart Details Patient Name: Date of Service: Laura Muscat NNIE Santiago. 04/04/2023 9:15 A Santiago Medical Record Number: [id number] Patient Account Number: [id number] Date of Birth/Sex: Treating RN: [birth year] (77 y.o. Laura Santiago Primary Care Palmira Stickle: Laura Santiago Other Clinician: Betha Santiago Referring Alida Greiner: Treating Samanyu Tinnell/Extender: Laura Santiago in Treatment: 5 Vital Signs Height(in): 66 Pulse(bpm): 101 Weight(lbs): 153 Blood Pressure(mmHg): 154/78 Body Mass Index(BMI): 24.7 Temperature(F): 97.6 Respiratory Rate(breaths/min): 18 [10:Photos:] [12:[id number] Page 4 of 11] Left, Posterior Lower Leg Left, Lateral Lower Leg Left, Medial Ankle Wound Location: Gradually Appeared Gradually Appeared Gradually Appeared Wounding Event: Venous Leg Ulcer Venous Leg Ulcer Venous Leg Ulcer Primary Etiology: Hypertension, Osteoarthritis, ReceivedHypertension, Osteoarthritis, ReceivedHypertension, Osteoarthritis, Received Comorbid History: Chemotherapy, Received Radiation Chemotherapy, Received Radiation Chemotherapy, Received Radiation 03/04/2023 03/04/2023 03/04/2023 Date Acquired: 3 3 3  Weeks of Treatment: Open Open Open Wound Status: No No No Wound Recurrence: Yes No Yes Clustered Wound: 7.9x4x0.2 1.8x1.2x0.2 2x1.9x0.2 Measurements L x W x D (cm) 24.819 1.696 2.985 A (cm) : rea 5.366  0.339 0.597 Volume (cm) : 58.40% -27.00% 1.00% % Reduction in Area: 16.80% -153.00% -97.70% % Reduction in Volume: Full Thickness Without Exposed Full Thickness Without Exposed Full Thickness Without Exposed Classification: Support Structures Support Structures Support Structures Medium Small Medium Exudate Amount: Serosanguineous Serosanguineous Serosanguineous Exudate Type: red, brown red, brown red, brown Exudate Color: Distinct, outline attached Distinct, outline attached Distinct, outline attached Wound Margin: Small (1-33%) Large (67-100%) Small (1-33%) Granulation Amount: Red Red Pink Granulation Quality: Large (67-100%) Small (1-33%) Large (67-100%) Necrotic Amount: Fat Layer (Subcutaneous Tissue): Yes Fat Layer (Subcutaneous Tissue): Yes Fat Layer (Subcutaneous Tissue): Yes Exposed  Structures: Fascia: No Fascia: No Fascia: No Tendon: No Tendon: No Tendon: No Muscle: No Muscle: No Muscle: No Joint: No Joint: No Joint: No Bone: No Bone: No Bone: No N/A None None Epithelialization: Treatment Notes Electronic Signature(s) Signed: 04/04/2023 4:32:09 PM By: Laura Santiago Entered By: Laura Santiago on 04/04/2023 09:45:26 -------------------------------------------------------------------------------- Multi-Disciplinary Care Plan Details Patient Name: Date of Service: Laura Muscat NNIE Santiago. 04/04/2023 9:15 A Santiago Medical Record Number: [id number] Patient Account Number: [id number] Date of Birth/Sex: Treating RN: 1946/04/09 (77 y.o. [name], [name] Primary Care Abigail Marsiglia: Laura Santiago Other Clinician: Betha Santiago Referring Cainen Burnham: Treating Erla Bacchi/Extender: Laura Santiago in Treatment: 46 Active Inactive Necrotic Tissue Nursing Diagnoses: Impaired tissue integrity related to necrotic/devitalized tissue Knowledge deficit related to management of necrotic/devitalized tissue Goals: Necrotic/devitalized tissue will be minimized in the wound bed Date Initiated: 03/13/2023 Target Resolution Date: 04/27/2023 Goal Status: Active Patient/caregiver will verbalize understanding of reason and process for debridement of necrotic tissue Date Initiated: 03/13/2023 Target Resolution Date: 04/27/2023 Goal Status: Active Interventions: Assess patient pain level pre-, during and post procedure and prior to discharge Provide education on necrotic tissue and debridement process Treatment Activities: Apply topical anesthetic as ordered : 07/06/2021 [name] ([id number]) [id number] Page 5 of 11 Biologic debridement : 07/06/2021 Enzymatic debridement : 07/06/2021 Excisional debridement : 07/06/2021 Notes: Wound/Skin Impairment Nursing Diagnoses: Impaired tissue integrity Goals: Patient/caregiver will verbalize  understanding of skin care regimen Date Initiated: 03/13/2023 Target Resolution Date: 04/27/2023 Goal Status: Active Ulcer/skin breakdown will have a volume reduction of 30% by week 4 Date Initiated: 03/13/2023 Target Resolution Date: 04/12/2023 Goal Status: Active Ulcer/skin breakdown will have a volume reduction of 50% by week 8 Date Initiated: 03/13/2023 Target Resolution Date: 03/13/2023 Goal Status: Active Ulcer/skin breakdown will have a volume reduction of 80% by week 12 Date Initiated: 03/13/2023 Target Resolution Date: 06/12/2023 Goal Status: Active Ulcer/skin breakdown will heal within 14 weeks Date Initiated: 03/13/2023 Target Resolution Date: 06/26/2023 Goal Status: Active Interventions: Assess patient/caregiver ability to obtain necessary supplies Assess patient/caregiver ability to perform ulcer/skin care regimen upon admission and as needed Assess ulceration(s) every visit Treatment Activities: Referred to DME Shley Dolby for dressing supplies : 07/06/2021 Skin care regimen initiated : 07/06/2021 Notes: Electronic Signature(s) Signed: 04/04/2023 4:32:09 PM By: Laura Santiago Signed: 04/04/2023 4:54:10 PM By: Elliot Gurney, BSN, RN, CWS, [name] RN, BSN Entered By: Laura Santiago on 04/04/2023 10:16:14 -------------------------------------------------------------------------------- Pain Assessment Details Patient Name: Date of Service: Laura Muscat NNIE Santiago. 04/04/2023 9:15 A Santiago Medical Record Number: [id number] Patient Account Number: [id number] Date of Birth/Sex: Treating RN: 08/15/1946 (77 y.o. Laura Santiago Primary Care Cicilia Clinger: Laura Santiago Other Clinician: Betha Santiago Referring Eller Sweis: Treating Luisenrique Conran/Extender: Laura Santiago in Treatment: 38 Active Problems Location of Pain Severity and Description of Pain Patient Has Paino No Site Locations Laura, Santiago Henderson ([id number]) [phone].pdf Page 6 of 11 Pain Management  and  Medication Current Pain Management: Electronic Signature(s) Signed: 04/04/2023 4:32:09 PM By: Laura Santiago Signed: 04/04/2023 4:54:10 PM By: Elliot Gurney, BSN, RN, CWS, [name] RN, BSN Entered By: Laura Santiago on 04/04/2023 09:30:39 -------------------------------------------------------------------------------- Patient/Caregiver Education Details Patient Name: Date of Service: Laura Muscat NNIE Santiago. 4/24/2024andnbsp9:15 A Santiago Medical Record Number: [id number] Patient Account Number: [id number] Date of Birth/Gender: Treating RN: August 29, 1946 (77 y.o. Laura Santiago Primary Care Physician: Laura Santiago Other Clinician: Betha Santiago Referring Physician: Treating Physician/Extender: Laura Santiago in Treatment: 4 Education Assessment Education Provided To: Patient Education Topics Provided Wound/Skin Impairment: Handouts: Other: continue wound care as directed Methods: Explain/Verbal Responses: State content correctly Electronic Signature(s) Signed: 04/04/2023 4:32:09 PM By: Laura Santiago Entered By: Laura Santiago on 04/04/2023 13:02:15 -------------------------------------------------------------------------------- Wound Assessment Details Patient Name: Date of Service: Laura Muscat NNIE Santiago. 04/04/2023 9:15 A Santiago Medical Record Number: [id number] Patient Account Number: [id number] Date of Birth/Sex: Treating RN: May 09, 1946 (77 y.o. Laura Santiago Primary Care Aariya Ferrick: Laura Santiago Other Clinician: Betha Santiago Referring Estell Puccini: Treating Diontre Harps/Extender: Claudie Fisherman Weeks in Treatment: 78 Wound Status Wound Number: 10 Primary Venous Leg Ulcer Etiology: Wound Location: Left, Posterior Lower Leg Wound Status: Open Wounding Event: Gradually Appeared Comorbid Hypertension, Osteoarthritis, Received Chemotherapy, Date Acquired: 03/04/2023 Laura, Santiago ([id number]) [id number] Page 7 of 11 Date  Acquired: 03/04/2023 History: Received Radiation Weeks Of Treatment: 3 Clustered Wound: Yes Wound Measurements Length: (cm) 7.4 Width: (cm) 6 Depth: (cm) 0.2 Area: (cm) 34.872 Volume: (cm) 6.974 % Reduction in Area: 41.6% % Reduction in Volume: -16.8% Wound Description Classification: Full Thickness Without Exposed Suppor Wound Margin: Distinct, outline attached Exudate Amount: Medium Exudate Type: Serosanguineous Exudate Color: red, brown t Structures Foul Odor After Cleansing: No Slough/Fibrino Yes Wound Bed Granulation Amount: Small (1-33%) Exposed Structure Granulation Quality: Red Fascia Exposed: No Necrotic Amount: Large (67-100%) Fat Layer (Subcutaneous Tissue) Exposed: Yes Tendon Exposed: No Muscle Exposed: No Joint Exposed: No Bone Exposed: No Treatment Notes Wound #10 (Lower Leg) Wound Laterality: Left, Posterior Cleanser Peri-Wound Care Topical Keystone Primary Dressing Aquacel Extra Hydrofiber Dressing, 4x5 (in/in) Secondary Dressing ABD Pad 5x9 (in/in) Discharge Instruction: Cover with ABD pad Secured With Compression Wrap 3-LAYER WRAP - Profore Lite LF 3 Multilayer Compression Bandaging System Discharge Instruction: Apply 3 multi-layer wrap as prescribed. Compression Stockings Add-Ons Electronic Signature(s) Signed: 04/04/2023 4:32:09 PM By: Laura Santiago Signed: 04/04/2023 4:54:10 PM By: Elliot Gurney, BSN, RN, CWS, [name] RN, BSN Entered By: Laura Santiago on 04/04/2023 09:59:13 -------------------------------------------------------------------------------- Wound Assessment Details Patient Name: Date of Service: Laura Muscat NNIE Santiago. 04/04/2023 9:15 A Santiago Medical Record Number: [id number] Patient Account Number: [id number] Date of Birth/Sex: Treating RN: 1946-09-09 (77 y.o. Laura Santiago Primary Care Meliyah Simon: Laura Santiago Other Clinician: Betha Santiago Referring Patte Winkel: Treating Niyonna Betsill/Extender: Claudie Fisherman Weeks in  Treatment: 15 Wound Status Wound Number: 11 Primary Venous Leg Ulcer Etiology: Wound Location: Left, Lateral Lower Leg Wound Status: Open Wounding Event: Gradually Appeared Comorbid Hypertension, Osteoarthritis, Received Chemotherapy, Date Acquired: 03/04/2023 Laura, Santiago ([id number]) [id number] Page 8 of 11 Date Acquired: 03/04/2023 History: Received Radiation Weeks Of Treatment: 3 Clustered Wound: No Photos Wound Measurements Length: (cm) 1.8 Width: (cm) 1.2 Depth: (cm) 0.2 Area: (cm) 1.696 Volume: (cm) 0.339 % Reduction in Area: -27% % Reduction in Volume: -153% Epithelialization: None Wound Description Classification: Full Thickness Without Exposed Suppor Wound Margin: Distinct, outline attached Exudate Amount: Small Exudate Type: Serosanguineous Exudate Color: red, brown t Structures Foul Odor After Cleansing: No Slough/Fibrino  Yes Wound Bed Granulation Amount: Large (67-100%) Exposed Structure Granulation Quality: Red Fascia Exposed: No Necrotic Amount: Small (1-33%) Fat Layer (Subcutaneous Tissue) Exposed: Yes Necrotic Quality: Adherent Slough Tendon Exposed: No Muscle Exposed: No Joint Exposed: No Bone Exposed: No Treatment Notes Wound #11 (Lower Leg) Wound Laterality: Left, Lateral Cleanser Peri-Wound Care Topical Keystone Primary Dressing Aquacel Extra Hydrofiber Dressing, 4x5 (in/in) Secondary Dressing ABD Pad 5x9 (in/in) Discharge Instruction: Cover with ABD pad Secured With Compression Wrap 3-LAYER WRAP - Profore Lite LF 3 Multilayer Compression Bandaging System Discharge Instruction: Apply 3 multi-layer wrap as prescribed. Compression Stockings Add-Ons Electronic Signature(s) Signed: 04/04/2023 4:32:09 PM By: Laura Santiago Signed: 04/04/2023 4:54:10 PM By: Elliot Gurney, BSN, RN, CWS, [name] RN, BSN Entered By: Laura Santiago on 04/04/2023 09:43:50 [name] ([id number]) [id number] Page  9 of 11 -------------------------------------------------------------------------------- Wound Assessment Details Patient Name: Date of Service: Laura Santiago. 04/04/2023 9:15 A Santiago Medical Record Number: [id number] Patient Account Number: [id number] Date of Birth/Sex: Treating RN: 1946/11/15 (77 y.o. [name], [name] Primary Care Bevin Das: Laura Santiago Other Clinician: Betha Santiago Referring Macklen Wilhoite: Treating Janyiah Silveri/Extender: Laura Santiago in Treatment: 34 Wound Status Wound Number: 12 Primary Venous Leg Ulcer Etiology: Wound Location: Left, Medial Ankle Wound Status: Open Wounding Event: Gradually Appeared Comorbid Hypertension, Osteoarthritis, Received Chemotherapy, Date Acquired: 03/04/2023 History: Received Radiation Weeks Of Treatment: 3 Clustered Wound: Yes Photos Wound Measurements Length: (cm) 2 Width: (cm) 1.9 Depth: (cm) 0.2 Area: (cm) 2.985 Volume: (cm) 0.597 % Reduction in Area: 1% % Reduction in Volume: -97.7% Epithelialization: None Wound Description Classification: Full Thickness Without Exposed Suppor Wound Margin: Distinct, outline attached Exudate Amount: Medium Exudate Type: Serosanguineous Exudate Color: red, brown t Structures Foul Odor After Cleansing: No Slough/Fibrino Yes Wound Bed Granulation Amount: Small (1-33%) Exposed Structure Granulation Quality: Pink Fascia Exposed: No Necrotic Amount: Large (67-100%) Fat Layer (Subcutaneous Tissue) Exposed: Yes Necrotic Quality: Adherent Slough Tendon Exposed: No Muscle Exposed: No Joint Exposed: No Bone Exposed: No Treatment Notes Wound #12 (Ankle) Wound Laterality: Left, Medial Cleanser Peri-Wound Care Topical Keystone Primary Dressing Aquacel Extra Hydrofiber Dressing, 4x5 (in/in) Secondary Dressing ABD Pad 5x9 (in/in) Discharge Instruction: Cover with ABD pad Secured With Compression Laura, Santiago ([id number])  [id number] Page 10 of 11 3-LAYER WRAP - Profore Lite LF 3 Multilayer Compression Bandaging System Discharge Instruction: Apply 3 multi-layer wrap as prescribed. Compression Stockings Add-Ons Electronic Signature(s) Signed: 04/04/2023 4:32:09 PM By: Laura Santiago Signed: 04/04/2023 4:54:10 PM By: Elliot Gurney, BSN, RN, CWS, [name] RN, BSN Entered By: Laura Santiago on 04/04/2023 09:44:19 -------------------------------------------------------------------------------- Wound Assessment Details Patient Name: Date of Service: Laura Muscat NNIE Santiago. 04/04/2023 9:15 A Santiago Medical Record Number: [id number] Patient Account Number: [id number] Date of Birth/Sex: Treating RN: 1946/10/07 (77 y.o. [name], [name] Primary Care Yuki Brunsman: Laura Santiago Other Clinician: Betha Santiago Referring Jeison Delpilar: Treating Fany Cavanaugh/Extender: Laura Santiago in Treatment: 31 Wound Status Wound Number: 13 Primary Venous Leg Ulcer Etiology: Wound Location: Left, Lateral, Posterior Lower Leg Wound Status: Open Wounding Event: Not Known Comorbid Hypertension, Osteoarthritis, Received Chemotherapy, Date Acquired: 02/14/2023 History: Received Radiation Weeks Of Treatment: 0 Clustered Wound: No Wound Measurements Length: (cm) 6 Width: (cm) 4 Depth: (cm) 0.2 Area: (cm) 18.85 Volume: (cm) 3.77 % Reduction in Area: % Reduction in Volume: Epithelialization: None Wound Description Classification: Full Thickness Without Exposed Suppor Exudate Amount: Medium Exudate Type: Serosanguineous Exudate Color: red, brown t Structures Foul Odor After Cleansing: No Slough/Fibrino Yes Wound Bed Granulation Amount: Medium (34-66%) Exposed Structure  Granulation Quality: Red Fascia Exposed: No Necrotic Amount: Small (1-33%) Fat Layer (Subcutaneous Tissue) Exposed: Yes Necrotic Quality: Adherent Slough Tendon Exposed: No Muscle Exposed: No Joint Exposed: No Bone Exposed:  No Treatment Notes Wound #13 (Lower Leg) Wound Laterality: Left, Lateral, Posterior Cleanser Peri-Wound Care Topical Primary Dressing Secondary Dressing Secured With Compression Wrap Compression Stockings Add-Ons Electronic Signature(s) Laura, Santiago ([id number]) [id number] Page 11 of 11 Signed: 04/04/2023 4:32:09 PM By: Laura Santiago Signed: 04/04/2023 4:54:10 PM By: Elliot Gurney, BSN, RN, CWS, [name] RN, BSN Entered By: Laura Santiago on 04/04/2023 09:56:46 -------------------------------------------------------------------------------- Vitals Details Patient Name: Date of Service: Laura Muscat NNIE Santiago. 04/04/2023 9:15 A Santiago Medical Record Number: [id number] Patient Account Number: [id number] Date of Birth/Sex: Treating RN: 10/04/1946 (77 y.o. [name], [name] Primary Care Shanine Kreiger: Laura Santiago Other Clinician: Betha Santiago Referring Roi Jafari: Treating Marti Acebo/Extender: Laura Santiago in Treatment: 59 Vital Signs Time Taken: 09:30 Temperature (F): 97.6 Height (in): 66 Pulse (bpm): 101 Weight (lbs): 153 Respiratory Rate (breaths/min): 18 Body Mass Index (BMI): 24.7 Blood Pressure (mmHg): 154/78 Reference Range: 80 - 120 mg / dl Electronic Signature(s) Signed: 04/04/2023 4:32:09 PM By: Laura Santiago Entered By: Laura Santiago on 04/04/2023 09:30:31

## 2023-04-05 NOTE — Progress Notes (Signed)
TAKERIA, MARQUINA ([id number]) [id number] Page 1 of 16 Visit Report for 04/04/2023 Chief Complaint Document Details Patient Name: Date of Service: Laura Santiago. 04/04/2023 9:15 A M Medical Record Number: [id number] Patient Account Number: [id number] Date of Birth/Sex: Treating RN: [birth year] (77 y.o. Laura Santiago Primary Care Provider: Marisue Santiago Other Clinician: Betha Santiago Referring Provider: Treating Provider/Extender: Laura Santiago in Treatment: 57 Information Obtained from: Patient Chief Complaint Left lower extremity wound Right toe wounds Left upper lateral thigh wounds Electronic Signature(s) Signed: 04/04/2023 10:24:22 AM By: Laura Corwin DO Entered By: Laura Santiago on 04/04/2023 10:12:59 -------------------------------------------------------------------------------- Debridement Details Patient Name: Date of Service: Laura Muscat [name] 04/04/2023 9:15 A M Medical Record Number: [id number] Patient Account Number: [id number] Date of Birth/Sex: Treating RN: Laura Santiago (77 y.o. Laura Santiago Primary Care Provider: Marisue Santiago Other Clinician: Betha Santiago Referring Provider: Treating Provider/Extender: Laura Santiago in Treatment: 91 Debridement Performed for Assessment: Wound #10 Left,Posterior Lower Leg Performed By: Physician Laura Corwin, MD Debridement Type: Debridement Severity of Tissue Pre Debridement: Fat layer exposed Level of Consciousness (Pre-procedure): Awake and Alert Pre-procedure Verification/Time Out Yes - 09:45 Taken: Start Time: 09:45 Percent of Wound Bed Debrided: 100% T Area Debrided (cm): otal 34.85 Tissue and other material debrided: Slough, Subcutaneous, Slough Level: Skin/Subcutaneous Tissue Debridement Description: Excisional Instrument: Curette Bleeding: Minimum Hemostasis Achieved: Pressure Response to Treatment:  Procedure was tolerated well Level of Consciousness (Post- Awake and Alert procedure): Post Debridement Measurements of Total Wound Length: (cm) 7.4 Width: (cm) 6 Depth: (cm) 0.2 Volume: (cm) 6.974 Character of Wound/Ulcer Post Debridement: Stable Severity of Tissue Post Debridement: Fat layer exposed Post Procedure Diagnosis Same as Pre-procedure Electronic Signature(s) Signed: 04/04/2023 10:24:22 AM By: Laura Corwin DO Signed: 04/04/2023 4:32:09 PM By: Laura Santiago Signed: 04/04/2023 4:54:10 PM By: Laura Gurney, BSN, RN, CWS, [name] RN, BSN Entered By: Laura Santiago on 04/04/2023 09:58:54 Laura Santiago ([id number]) [id number] Page 2 of 16 -------------------------------------------------------------------------------- Debridement Details Patient Name: Date of Service: Laura Santiago. 04/04/2023 9:15 A M Medical Record Number: [id number] Patient Account Number: [id number] Date of Birth/Sex: Treating RN: [birth year] (77 y.o. Laura Santiago Primary Care Provider: Marisue Santiago Other Clinician: Betha Santiago Referring Provider: Treating Provider/Extender: Laura Santiago in Treatment: 91 Debridement Performed for Assessment: Wound #11 Left,Lateral Lower Leg Performed By: Physician Laura Corwin, MD Debridement Type: Debridement Severity of Tissue Pre Debridement: Fat layer exposed Level of Consciousness (Pre-procedure): Awake and Alert Pre-procedure Verification/Time Out Yes - 09:46 Taken: Start Time: 09:46 Percent of Wound Bed Debrided: 100% T Area Debrided (cm): otal 1.7 Tissue and other material debrided: Viable, Non-Viable, Slough, Slough Level: Non-Viable Tissue Debridement Description: Selective/Open Wound Instrument: Curette Bleeding: Minimum Hemostasis Achieved: Pressure Response to Treatment: Procedure was tolerated well Level of Consciousness (Post- Awake and Alert procedure): Post Debridement  Measurements of Total Wound Length: (cm) 1.8 Width: (cm) 1.2 Depth: (cm) 0.2 Volume: (cm) 0.339 Character of Wound/Ulcer Post Debridement: Stable Severity of Tissue Post Debridement: Fat layer exposed Post Procedure Diagnosis Same as Pre-procedure Electronic Signature(s) Signed: 04/04/2023 10:24:22 AM By: Laura Corwin DO Signed: 04/04/2023 4:32:09 PM By: Laura Santiago Signed: 04/04/2023 4:54:10 PM By: Laura Gurney, BSN, RN, CWS, [name] RN, BSN Entered By: Laura Santiago on 04/04/2023 10:00:01 -------------------------------------------------------------------------------- Debridement Details Patient Name: Date of Service: Laura Muscat [name] 04/04/2023 9:15 A M Medical Record Number: [id number] Patient Account Number: [id number] Date of Birth/Sex: Treating RN: [birth year] (77 y.o. Laura Santiago Primary  Care Provider: Marisue Santiago Other Clinician: Betha Santiago Referring Provider: Treating Provider/Extender: Laura Santiago in Treatment: 91 Debridement Performed for Assessment: Wound #12 Left,Medial Ankle Performed By: Physician Laura Corwin, MD Debridement Type: Debridement Severity of Tissue Pre Debridement: Fat layer exposed Level of Consciousness (Pre-procedure): Awake and Alert Pre-procedure Verification/Time Out Yes - 09:48 Taken: Start Time: 09:48 Percent of Wound Bed Debrided: 100% T Area Debrided (cm): otal 2.98 Tissue and other material debrided: Viable, Non-Viable, Slough, Subcutaneous, Slough Level: Skin/Subcutaneous Tissue Debridement Description: Excisional Instrument: Curette Bleeding: Minimum Hemostasis Achieved: Pressure [name], [name] ([id number]) [id number] Page 3 of 16 Response to Treatment: Procedure was tolerated well Level of Consciousness (Post- Awake and Alert procedure): Post Debridement Measurements of Total Wound Length: (cm) 2 Width: (cm) 1.9 Depth: (cm) 0.2 Volume: (cm)  0.597 Character of Wound/Ulcer Post Debridement: Stable Severity of Tissue Post Debridement: Fat layer exposed Post Procedure Diagnosis Same as Pre-procedure Electronic Signature(s) Signed: 04/04/2023 10:24:22 AM By: Laura Corwin DO Signed: 04/04/2023 4:32:09 PM By: Laura Santiago Signed: 04/04/2023 4:54:10 PM By: Laura Gurney, BSN, RN, CWS, [name] RN, BSN Entered By: Laura Santiago on 04/04/2023 10:00:40 -------------------------------------------------------------------------------- Debridement Details Patient Name: Date of Service: Laura Muscat [name] 04/04/2023 9:15 A M Medical Record Number: [id number] Patient Account Number: [id number] Date of Birth/Sex: Treating RN: [birth year] (77 y.o. Laura Santiago Primary Care Provider: Marisue Santiago Other Clinician: Betha Santiago Referring Provider: Treating Provider/Extender: Laura Santiago in Treatment: 91 Debridement Performed for Assessment: Wound #13 Left,Lateral,Posterior Lower Leg Performed By: Physician Laura Corwin, MD Debridement Type: Debridement Severity of Tissue Pre Debridement: Fat layer exposed Level of Consciousness (Pre-procedure): Awake and Alert Pre-procedure Verification/Time Out Yes - 09:45 Taken: Start Time: 09:45 Percent of Wound Bed Debrided: 100% T Area Debrided (cm): otal 18.84 Tissue and other material debrided: Viable, Non-Viable, Slough, Subcutaneous, Slough Level: Skin/Subcutaneous Tissue Debridement Description: Excisional Instrument: Curette Bleeding: Minimum Hemostasis Achieved: Pressure Response to Treatment: Procedure was tolerated well Level of Consciousness (Post- Awake and Alert procedure): Post Debridement Measurements of Total Wound Length: (cm) 6 Width: (cm) 4 Depth: (cm) 0.2 Volume: (cm) 3.77 Character of Wound/Ulcer Post Debridement: Stable Severity of Tissue Post Debridement: Fat layer exposed Post Procedure Diagnosis Same as Pre-procedure Electronic  Signature(s) Signed: 04/04/2023 10:24:22 AM By: Laura Corwin DO Signed: 04/04/2023 4:32:09 PM By: Laura Santiago Signed: 04/04/2023 4:54:10 PM By: Laura Gurney, BSN, RN, CWS, [name] RN, BSN Entered By: Laura Santiago on 04/04/2023 10:01:11 HPI Details -------------------------------------------------------------------------------- Laura Santiago ([id number]) [id number] Page 4 of 16 Patient Name: Date of Service: Laura Santiago. 04/04/2023 9:15 A M Medical Record Number: [id number] Patient Account Number: [id number] Date of Birth/Sex: Treating RN: [birth year] (77 y.o. Laura Santiago Primary Care Provider: Marisue Santiago Other Clinician: Betha Santiago Referring Provider: Treating Provider/Extender: Laura Santiago in Treatment: 72 History of Present Illness HPI Description: Admission 7/27 Ms. Mariya Mottley is a 77 year old female with a past medical history of ADHD, metastatic breast cancer, stage IV chronic kidney disease, history of DVT on Xarelto and chronic venous insufficiency that presents to the clinic for a chronic left lower extremity wound. She recently moved to [facility] 4 days ago. She was being followed by wound care center in [place]. She reports a 10-year history of wounds to her left lower extremity that eventually do heal with debridement and compression therapy. She states that the current wound reopened 4 months ago and she is using Vaseline and Coban. She denies signs of  infection. 8/3; patient presents for 1 week follow-up. She reports no issues or complaints today. She states she had vascular studies done in the last week. She denies signs of infection. She brought her little service dog with her today. 8/17; patient presents for follow-up. She has missed her last clinic appointment. She states she took the wrap off and attempted to rewrap her leg. She is having difficulty with transportation. She has  her service dog with her today. Overall she feels well and reports improvement in wound healing. She denies signs of infection. She reports owning an old Velcro wrap compression and has this at her living facility 9/14; patient presents for follow-up. Patient states that over the past 2 to 3 weeks she developed toe wounds to her right foot. She attributes this to tight fitting shoes. She subsequently developed cellulitis in the right leg and has been treated by doxycycline by her oncologist. She reports improvement in symptoms however continues to have some redness and swelling to this leg. T the left lower extremity patient has been having her wraps changed with home health twice weekly. She states that the [facility] is not helping control o the drainage. Other than that she has no issues or complaints today. She denies signs of infection to the left lower extremity. 9/21; patient presents for follow-up. She reports seeing infectious disease for her cellulitis. She reports no further management. She has home health that changes the wraps twice weekly. She has no issues or complaints today. She denies signs of infection. 10/5; patient presents for follow-up. She has no issues or complaints today. She denies signs of infection. She states that the right great toe has not been dressed by home health. 10/12; patient presents for follow-up. She has no issues or complaints today. She reports improvement in her wound healing. She has been using silver alginate to the right great toe wound. She denies signs of infection. 10/26; patient presents for follow-up. Home health did not have sorbact so they continued to use Hydrofera Blue under the wrap. She has been using silver alginate to the great toe wound however she did not have a dressing in place today. She currently denies signs of infection. 11/2; patient presents for follow-up. She has been using sorb act under the compression wrap. She reports  using silver alginate to the toe wound again she does not have a dressing in place. She currently denies signs of infection. 11/23; patient presents for follow-up. Unfortunately she has missed her last 2 clinic appointments. She was last seen 3 weeks ago. She did her own compression wrap with Kerlix and Coban yesterday after seeing vein and vascular. She has not been dressing her right great toe wound. She currently denies signs of infection. 11/30; patient presents for 1 week follow-up. She states she changed her dressing last week prior to home health and use sorb act with Dakin's and Hydrofera Blue. Home health has changed the dressing as well and they have been using sorbact. T oday she reports increased redness to her right lower extremity. She has a history of cellulitis to this leg. She has been using silver alginate to the right great toe. Unfortunately she had an episode of diarrhea prior to coming in and had feces all over the right leg and to the wrap of her left leg. 12/7; patient presents for 1 week follow-up. She states that home health did not come out to change the dressing and she took it off yesterday. It is unclear  if she is dressing the right toe wound. She denies signs of infection. 12/14; patient presents for 1 week follow-up. She has no issues or complaints today. 12/21; patient presents for follow-up. She has no issues or complaints today. She denies signs of infection. 12/28/2021; patient presents for follow-up. She was hospitalized for sepsis secondary to right lower extremity cellulitis On 12/23. She states she is currently at a SNF. She states that she was started on doxycycline this morning for her right great toe swelling and redness. She is not sure what dressings have been done to her left lower extremity for the past 3 weeks. She says its been mainly gauze with an Ace wrap. 1/25; patient presents for follow-up. She is still residing in a skilled nursing facility. She  reports mild pain to the left lower extremity wound bed. She states she is going to see a podiatrist soon. 2/8; patient presents for follow-up. She has moved back to her residential community from her skilled nursing facility. She has no issues or complaints today. She denies signs of systemic infections. 2/15; patient presents for follow-up. He has no issues or complaints today. She denies systemic signs of infection. 2/22; patient presents for follow-up. She has no issues or complaints today. She denies signs of infection. 3/1; patient presents for follow-up. She states that home health came out the day after she was seen in our clinic and yesterday to do the wrap change. She denies signs of infection. She reports excoriated skin on the ankle. 3/8; patient presents for follow-up. She has no issues or complaints today. She denies signs of infection. 3/15; patient presents for follow-up. Home health has been coming out to change the dressings. She reports more tenderness to the wound site. She denies purulent drainage, increased warmth or erythema to the area. 4/5; patient presents for follow-up. She has missed her last 2 clinic appointments. I have not seen her in 3 weeks. She was recently hospitalized for altered mental status. She was involuntarily committed. She was evaluated by psychiatry and deemed to have competency. There was no specific cause of her altered mental status. It was concluded that her physical and mental health were declining due to her chronic medical conditions. Currently home health has been coming out for dressing changes. Patient has also been doing her own dressing changes. She reports more skin breakdown to the periwound and now has a new wound. She denies fever/chills. She reports continued tenderness to the wound site. AURORE, REDINGER ([id number]) [id number] Page 5 of 16 4/12; patient with significant venous insufficiency and a large wound  on her left lower leg taking up about 80% of the circumference of her lower leg. Cultures of this grew MRSA and Pseudomonas. She had completed a course of ciprofloxacin now is starting doxycycline. She has been using Dakin's wet-to-dry and a Tubigrip. She has home health twice a week and we change it once. 4/19; patient presents for follow-up. She completed her course of doxycycline. She has been using Dakin's wet-to-dry dressing and Tubigrip. Home health changes the dressing twice weekly. Currently she has no issues or complaints. 4/26; patient presents for follow-up. At last clinic visit orders for home health were Iodosorb under compression therapy. Unfortunately they did not have the dressing and have been using Dakin's and gentamicin under the wrap. Patient currently denies signs of infection. She has no issues or complaints today. 5/3; patient presents for follow-up. Again Iodosorb has not been used under the compression therapy when home health comes  out to change the wrap and dressing. They have been using Sorbact. It is unclear why this is happening since we send orders weekly to the agency. She denies signs of infection. Patient has not purchased the [facility] antibiotics. We reached out to the company and they said they have been trying to contact her on a regular basis. We gave the patient the number to call to order the medication. 5/10; patient presents for follow-up. She has no issues or complaints today. Again home health has not been using Iodosorb. Mepilex was on the wound bed. No other dressings noted. She brought in her Keystone antibiotics. She denies signs of infection. 5/17; patient presents for follow-up. Home health has come out twice since she was last seen. Joint well she has been using Keystone antibiotic with Sorbact under the compression wrap. She has no issues or complaints today. She denies signs of infection. 5/24; patient presents for follow-up. We have been using  Keystone antibiotics with Sorbact under compression therapy. She is tolerating the treatment well. She is reporting improvement in wound healing. She denies signs of infection. 5/31; patient presents for follow-up. We continue to do [facility] antibiotics with Sorbact under compression therapy. She continues to report improvement in wound healing. Home health comes out and changes the dressing once weekly. 05-17-2022 upon evaluation today patient appears to be doing better in regard to her wound especially compared to the last time I saw her. Fortunately I do think that she is seeing improvements. With that being said I do believe that she may be benefit from sharp debridement today to clear away some of the necrotic debris I discussed that with her as well. She is an amendable to that plan. Otherwise she is very pleased with how the [facility] is doing for her. 6/14; patient presents for follow-up. We have been using Keystone antibiotic with Sorbact and absorbent dressings under 3 layer compression. She has no issues or complaints today. She reports improvement in wound healing. She denies signs of infection. 6/21; patient presents for follow-up. We are continuing with [facility] antibiotic and Sorbact under 3 layer compression. Patient has no complaints. Continued wound healing is happening. She denies signs of infection. 6/28; patient presents for follow-up. We have been using Keystone antibiotic with Sorbact under 3 layer compression. Usually home health comes out and changes the dressing twice a week. Unfortunately they did not go out to change the dressing. It is unclear why. Patient did not call them. She currently denies signs of infection. 7/5; patient presents for follow-up. We have been using Keystone antibiotic with calcium alginate under 3 layer compression. She reports improvement in wound healing. She denies signs of infection. Home health has come out to do dressing changes twice this past  week. 7/12; patient presents for follow-up. We have been using Keystone antibiotic with calcium alginate under 3 layer compression. Patient states that home health came out once last week to change the dressing. She reports improvement in wound healing. She currently denies signs of infection. 7/19; patient presents for follow-up. We have been using Keystone antibiotic with calcium alginate under 3 layer compression. Home health came out once last week to change the dressing. She has no issues or complaints today. She denies signs of infection. 8/2; patient presents for follow-up. We have been using Keystone antibiotic with calcium alginate under 3 layer compression. Unfortunately she missed her appointment last week and home health did not come out to do dressing changes. Patient currently denies signs of infection.  8/9; patient presents for follow-up. We have been using Keystone with calcium alginate under 3 layer compression. She states that home health came out once last week. She currently denies signs of infection. Her wrap was completely wet. She states she was cleaning the top of the leg and water soaked down into the wrap. 8/16; patient presents for follow-up. We have been using Keystone with calcium alginate under 3 layer compression. She states that home health came out twice last week. She has no issues or complaints today. 8/23; patient presents for follow-up. He has been using Keystone with calcium alginate under 3 layer compression. Home health came out twice last week. She denies signs of infection. 8/30; patient presents for follow-up. We have been using Keystone with calcium alginate under 3 layer compression. Home health came out once last week to change the dressing. Patient reports improvement in wound healing. She states she is almost done with her chemotherapy infusions and has 1 more left. 9/13; patient presents for follow-up. She has lost the capsules to her [facility]  antibiotic which I believe is the vancomycin pills. She has her Zosyn powder today. We have been using Keystone antibiotic ointment with calcium alginate under 3 layer compression. She is concerned about systemic infection however her vitals are stable and there is no surrounding soft tissue infection. She would like to remain a patient in our wound care center however would like a second opinion for her wound care at another facility. She asked to be referred to [facility] wound care center. 9/20; patient presents for follow-up. She found her vancomycin capsules and brought in her complete Keystone antibiotic ointment set today. Unfortunately she has developed skin breakdown and Erythema to the right lower extremity With increased swelling. She states she went to a pow wow Over the weekend and was on her feet for extended periods of time. She saw her oncologist yesterday who prescribed her doxycycline for her right lower extremity erythema. 9/27; patient presents for follow-up. We have been using Keystone antibiotic with Aquacel under 3 layer compression to the lower extremities bilaterally. When home health came and changed the wrap she secretly put coffee into the spray mix along with [facility] antibiotic on her leg thinking the acidic component would better activate the zoysn (sonething she discussed with her microbiologist brother). She has reported improvement in wound healing. 10/4; patient presents for follow-up. She has no issues or complaints today. We have been doing Aquacel and keystone under 3 layer compression to the lower extremities bilaterally. This morning she took the right lower extremity wrap off as it was uncomfortable. She has no open wounds to this leg. 10/11; patient presents for follow-up. We have been doing Aquacel with Keystone antibiotic ointment under 3 layer compression to the left lower extremity. She developed a small blister to the anterior aspect of the left leg noticed when  the wrap was taken off on intake. She currently denies signs of infection. 10/18; patient presents for follow-up. We have been doing Aquacel with Keystone antibiotic ointment under 3 layer compression to the left lower extremity. There has been continued improvement in wound healing. She denies signs of infection. 10/25; patient arrives for treatment of venous insufficiency ulcers on her left lower leg both lateral and medial are remanence of apparently a circumferential wound. Much improved. We are using topicals Keystone and Aquacel Ag under 3 layer compression we continue to make good progress. The patient talk to me at some length with regards to different things she  has on her forehead and her Peri orbital area for which she is apparently applying KARIANNE, NOGUEIRA ([id number]) [id number] Page 6 of [address]. She feels that what ever we are treating on her wounds is a more systemic problem. I really was not able to get a handle on what she is talking about however I did caution her not to put the [place] in her eyes. 11/1; her wounds continue to improve she is using Keystone and Aquacel Ag G under 3 layer compression. Our intake nurse notes erythema and edema in the right leg. The patient has a litany of concerns with regards to a rash on her forehead or ears and other systemic complaints. She has an appointment with dermatology on November 11 11/8; patient presents for follow-up. We have been using Keystone and Aquacel under 4-layer compression. She has no issues or complaints today. She reports improvement in wound healing. 11/15; patient presents for follow-up. We have been using Aquacel with Keystone antibiotic under 3 layer compression. Patient continues improvement in wound healing. 12/6; patient presents for follow-up. We have been using Aquacel with Keystone antibiotic ointment under 3 layer compression. Wounds appear well-healing. 12/13; patient presents  for follow-up. We have been using Aquacel with Keystone antibiotic under 3 layer compression. She has no issues or complaints today. 12/27 left lateral medial ankle. Superficial wounds remain there is significantly improved we are using Keystone backed with Zetuvit under 4-layer compression 1/3; patient presents for follow-up. Her wounds appear well-healing. We have been using Aquacel Ag with Keystone antibiotic ointment under compression therapy. This should be a 3 layer compression. 1/17; patient presents for follow-up. Her wounds on her left lower extremity are well-healing. We are using Aquacel Ag and Keystone antibiotic ointment under compression therapy. She missed her last clinic appointment. She states that the wrap has been changed twice weekly since she was last seen. Unfortunately she has developed increased warmth and redness to the right lower extremity consistent with cellulitis. She states this started a few days ago. 1/24; patient presents for follow-up. Her wounds on the left lower extremity are well-healing with Aquacel and Keystone antibiotic ointment under compression therapy. I prescribed Keflex at last clinic visit for cellulitis of the right lower extremity. Unfortunately this has not resolved. She did not follow-up with her PCP for contact our office about her symptoms. 1/31; patient presents for follow-up. We have been using Aquacel and Keystone antibiotic ointment under compression therapy to the left lower extremity. Wounds appear well-healing. She has been taking clindamycin for the past week. Her symptoms have improved slightly with a decrease in erythema and warmth to the right lower extremity. She says her pain level has improved. However Symptoms have not completely resolved. She denies fever/chills, nausea/vomiting. 2/7; patient presents for follow-up. We have been using Aquacel Ag with Keystone antibiotic ointment under compression therapy to the left lower  extremity. For the past week home health has not been using Keystone antibiotic ointment. She continues to take clindamycin. Her symptoms have improved greatly with the decrease in erythema and warmth to the right lower extremity. She denies systemic signs of infection. She has an area of skin breakdown to the right anterior leg. 2/14; Patient presents for follow-up. She has been using Hydrofera Blue to the right anterior leg under Tubigrip. It looks like Hydrofera Blue is also being used with Keystone to the left lower extremity under compression therapy. Order is for Aqualcell Ag. Overall wounds appear well healing. She has no issues or  complaints. 2/21; patient presents for follow-up. We have been using Hydrofera Blue under 3 layer compression to the lower extremities bilaterally. Her right lower extremity wounds have healed. She has a small open wound remaining to her left lateral leg. 2/28; patient presents for follow-up. We have been using Hydrofera Blue under 3 layer compression. Home health has changed the dressing to the left lower extremity however started the wrap at the ankle. 3/6; patient presents for follow-up. We have been using PolyMem with antibiotic ointment under 3 layer compression to the left lower extremity. She has no issues or complaints today. 3/12; patient presents for follow-up. We have been using Aquacel Ag under 3 layer compression to the left lower extremity. Her wound is healed. She has juxta lite compression wraps at home. Readmission: 03-13-2023 upon evaluation today patient appears to be doing poorly in regard to her lower extremities. Specifically it is the left lower extremity at this point that is causing her problems she has multiple wounds open. With that being said she does not sound like she has been wearing her compression stockings on a regular basis she has previously seen Dr. Mikey Bussing. With that being said upon evaluation today she tells me that since the  wounds have reopened that she has been having a lot of discomfort she also tells me that she went back to using some of the dressings that she had leftover at home she does not know exactly what everything was. Nonetheless she also tells me that she has made an appointment with a dermatologist in town and has to see them tomorrow therefore she is not going to allow [place] to wrap her legs today since "that did not work anyway". With that being said obviously she did improve last time we got her wound healed we order her juxta lite compression wraps again it does not sound like she has been using those appropriately at home. 4/10; patient presents for follow-up. Unfortunately she reopened last week and was seen by [facility]. At that time the patient declined in office wraps. She states she is wearing her juxta lite compression wraps however she did not have these on today. 4/17; patient presents for follow-up. At last clinic visit a PCR culture was done that grew Staph aureus and Enterobacter cola CA and Enterococcus bacillus. Keystone antibiotic ointment was ordered. She has not received this yet. We have been using antibiotic ointment with Hydrofera Blue under compression therapy. There is been improvement in her wound healing. She has no issues or complaints today. 4/24; patient presents for follow-up. She received Keystone antibiotic ointment and this was started 3 days ago when home health came to change the wrap. She has no issues or complaints today. We have been using Aquacel along with compression wrap as well. Electronic Signature(s) Signed: 04/04/2023 10:24:22 AM By: Laura Corwin DO Entered By: Laura Santiago on 04/04/2023 10:14:05 -------------------------------------------------------------------------------- Physical Exam Details Patient Name: Date of Service: Laura Muscat [name] 04/04/2023 9:15 A M Medical Record Number: [id number] Patient Account Number: [id number] Date of Birth/Sex:  Treating RN: [birth year], Santiago (77 y.o. Laura Santiago [place], [place] ([id number]) [phone].pdf Page 7 of 16 Primary Care Provider: Marisue Santiago Other Clinician: Betha Santiago Referring Provider: Treating Provider/Extender: Laura Santiago in Treatment: 60 Constitutional . Cardiovascular . Psychiatric . Notes Multiple open wounds to the left lower extremity with nonviable tissue. No signs of surrounding infection. 3+ pitting edema to the knee. Electronic Signature(s) Signed: 04/04/2023 10:24:22 AM By: Laura Corwin DO  Entered By: Laura Santiago on 04/04/2023 10:14:40 -------------------------------------------------------------------------------- Physician Orders Details Patient Name: Date of Service: Laura Muscat [name] 04/04/2023 9:15 A M Medical Record Number: [id number] Patient Account Number: [id number] Date of Birth/Sex: Treating RN: [birth year] Santiago (77 y.o. Laura Santiago Primary Care Provider: Marisue Santiago Other Clinician: Betha Santiago Referring Provider: Treating Provider/Extender: Laura Santiago in Treatment: 72 Verbal / Phone Orders: Yes Clinician: Huel Coventry Read Back and Verified: Yes Diagnosis Coding Follow-up Appointments Return Appointment in 1 week. Nurse Visit as needed Home Health Home Health Company: [facility] [phone] **Please direct any NON-WOUND related issues/requests for orders to patient's Primary Care Physician. **If current dressing causes regression in wound condition, may D/C ordered dressing product/s and apply Normal Saline Moist Dressing daily until next Wound Healing Center or Other MD appointment. **Notify Wound Healing Center of regression in wound condition at [phone]. Bathing/ Shower/ Hygiene May shower with wound dressing protected with water repellent cover or cast protector. Edema Control - Lymphedema / Segmental Compressive Device /  Other Optional: One layer of unna paste to top of compression wrap (to act as an anchor). Elevate, Exercise Daily and A void Standing for Long Periods of Time. Elevate legs to the level of the heart and pump ankles as often as possible Elevate leg(s) parallel to the floor when sitting. DO YOUR BEST to sleep in the bed at night. DO NOT sleep in your recliner. Long hours of sitting in a recliner leads to swelling of the legs and/or potential wounds on your backside. Additional Orders / Instructions Follow Nutritious Diet and Increase Protein Intake Wound Treatment Wound #10 - Lower Leg Wound Laterality: Left, Posterior Topical: Keystone 3 x Per Week/30 Days Prim Dressing: Aquacel Extra Hydrofiber Dressing, 4x5 (in/in) ary 3 x Per Week/30 Days Secondary Dressing: ABD Pad 5x9 (in/in) 3 x Per Week/30 Days Discharge Instructions: Cover with ABD pad Compression Wrap: 3-LAYER WRAP - Profore Lite LF 3 Multilayer Compression Bandaging System 3 x Per Week/30 Days Discharge Instructions: Apply 3 multi-layer wrap as prescribed. Wound #11 - Lower Leg Wound Laterality: Left, Lateral Topical: Keystone 3 x Per Week/30 Days Prim Dressing: Aquacel Extra Hydrofiber Dressing, 4x5 (in/in) ary 3 x Per Week/30 Days ENMA, MAEDA ([id number]) [id number] Page 8 of 16 Secondary Dressing: ABD Pad 5x9 (in/in) 3 x Per Week/30 Days Discharge Instructions: Cover with ABD pad Compression Wrap: 3-LAYER WRAP - Profore Lite LF 3 Multilayer Compression Bandaging System 3 x Per Week/30 Days Discharge Instructions: Apply 3 multi-layer wrap as prescribed. Wound #12 - Ankle Wound Laterality: Left, Medial Topical: Keystone 3 x Per Week/30 Days Prim Dressing: Aquacel Extra Hydrofiber Dressing, 4x5 (in/in) ary 3 x Per Week/30 Days Secondary Dressing: ABD Pad 5x9 (in/in) 3 x Per Week/30 Days Discharge Instructions: Cover with ABD pad Compression Wrap: 3-LAYER WRAP - Profore Lite LF 3 Multilayer  Compression Bandaging System 3 x Per Week/30 Days Discharge Instructions: Apply 3 multi-layer wrap as prescribed. Electronic Signature(s) Signed: 04/04/2023 10:24:22 AM By: Laura Corwin DO Entered By: Laura Santiago on 04/04/2023 10:17:51 -------------------------------------------------------------------------------- Problem List Details Patient Name: Date of Service: Laura Muscat [name] 04/04/2023 9:15 A M Medical Record Number: [id number] Patient Account Number: [id number] Date of Birth/Sex: Treating RN: [birth year] (77 y.o. Laura Santiago Primary Care Provider: Marisue Santiago Other Clinician: Betha Santiago Referring Provider: Treating Provider/Extender: Laura Santiago in Treatment: 33 Active Problems ICD-10 Encounter Code Description Active Date MDM Diagnosis [phone] Non-pressure chronic ulcer of other part of left  lower leg with fat layer exposed11/23/2022 No Yes 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 11/02/2021 No Yes I87.311 Chronic venous hypertension (idiopathic) with ulcer of right lower extremity 08/30/2022 No Yes I87.2 Venous insufficiency (chronic) (peripheral) 07/06/2021 No Yes Z79.01 Long term (current) use of anticoagulants 07/06/2021 No Yes I10 Essential (primary) hypertension 07/06/2021 No Yes 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 07/06/2021 No Yes L03.115 Cellulitis of right lower limb 12/27/2022 No Yes S81.801A Unspecified open wound, right lower leg, initial encounter 01/17/2023 No Yes ARYONA, SILL ([id number]) [id number] Page 9 of 16 Inactive Problems ICD-10 Code Description Active Date Inactive Date S81.802A Unspecified open wound, left lower leg, initial encounter 07/06/2021 07/06/2021 S91.101A Unspecified open wound of right great toe without damage to nail, initial encounter 08/24/2021 08/24/2021 S91.104A Unspecified open wound of right lesser  toe(s) without damage to nail, initial encounter 08/24/2021 08/24/2021 Resolved Problems ICD-10 Code Description Active Date Resolved Date S91.104D Unspecified open wound of right lesser toe(s) without damage to nail, subsequent 08/31/2021 08/31/2021 encounter S91.201D Unspecified open wound of right great toe with damage to nail, subsequent encounter 08/31/2021 08/31/2021 Electronic Signature(s) Signed: 04/04/2023 10:24:22 AM By: Laura Corwin DO Entered By: Laura Santiago on 04/04/2023 10:12:55 -------------------------------------------------------------------------------- Progress Note Details Patient Name: Date of Service: Laura Muscat [name] 04/04/2023 9:15 A M Medical Record Number: [id number] Patient Account Number: [id number] Date of Birth/Sex: Treating RN: [birth year] (77 y.o. Laura Santiago Primary Care Provider: Marisue Santiago Other Clinician: Betha Santiago Referring Provider: Treating Provider/Extender: Laura Santiago in Treatment: 28 Subjective Chief Complaint Information obtained from Patient Left lower extremity wound Right toe wounds Left upper lateral thigh wounds History of Present Illness (HPI) Admission 7/27 Ms. Clancy Leiner is a 77 year old female with a past medical history of ADHD, metastatic breast cancer, stage IV chronic kidney disease, history of DVT on Xarelto and chronic venous insufficiency that presents to the clinic for a chronic left lower extremity wound. She recently moved to [name] 4 days ago. She was being followed by wound care center in [place]. She reports a 10-year history of wounds to her left lower extremity that eventually do heal with debridement and compression therapy. She states that the current wound reopened 4 months ago and she is using Vaseline and Coban. She denies signs of infection. 8/3; patient presents for 1 week follow-up. She reports no issues or complaints today. She states she  had vascular studies done in the last week. She denies signs of infection. She brought her little service dog with her today. 8/17; patient presents for follow-up. She has missed her last clinic appointment. She states she took the wrap off and attempted to rewrap her leg. She is having difficulty with transportation. She has her service dog with her today. Overall she feels well and reports improvement in wound healing. She denies signs of infection. She reports owning an old Velcro wrap compression and has this at her living facility 9/14; patient presents for follow-up. Patient states that over the past 2 to 3 weeks she developed toe wounds to her right foot. She attributes this to tight fitting shoes. She subsequently developed cellulitis in the right leg and has been treated by doxycycline by her oncologist. She reports improvement in symptoms however continues to have some redness and swelling to this leg. T the left lower extremity patient has been having her wraps changed with home health twice weekly. She states that the [facility] is not helping control  o the drainage. Other than that she has no issues or complaints today. She denies signs of infection to the left lower extremity. 9/21; patient presents for follow-up. She reports seeing infectious disease for her cellulitis. She reports no further management. She has home health that changes the wraps twice weekly. She has no issues or complaints today. She denies signs of infection. 10/5; patient presents for follow-up. She has no issues or complaints today. She denies signs of infection. She states that the right great toe has not been dressed by home health. JERA, HEADINGS ([id number]) [id number] Page 10 of 16 10/12; patient presents for follow-up. She has no issues or complaints today. She reports improvement in her wound healing. She has been using silver alginate to the right great toe wound. She  denies signs of infection. 10/26; patient presents for follow-up. Home health did not have sorbact so they continued to use Hydrofera Blue under the wrap. She has been using silver alginate to the great toe wound however she did not have a dressing in place today. She currently denies signs of infection. 11/2; patient presents for follow-up. She has been using sorb act under the compression wrap. She reports using silver alginate to the toe wound again she does not have a dressing in place. She currently denies signs of infection. 11/23; patient presents for follow-up. Unfortunately she has missed her last 2 clinic appointments. She was last seen 3 weeks ago. She did her own compression wrap with Kerlix and Coban yesterday after seeing vein and vascular. She has not been dressing her right great toe wound. She currently denies signs of infection. 11/30; patient presents for 1 week follow-up. She states she changed her dressing last week prior to home health and use sorb act with Dakin's and Hydrofera Blue. Home health has changed the dressing as well and they have been using sorbact. T oday she reports increased redness to her right lower extremity. She has a history of cellulitis to this leg. She has been using silver alginate to the right great toe. Unfortunately she had an episode of diarrhea prior to coming in and had feces all over the right leg and to the wrap of her left leg. 12/7; patient presents for 1 week follow-up. She states that home health did not come out to change the dressing and she took it off yesterday. It is unclear if she is dressing the right toe wound. She denies signs of infection. 12/14; patient presents for 1 week follow-up. She has no issues or complaints today. 12/21; patient presents for follow-up. She has no issues or complaints today. She denies signs of infection. 12/28/2021; patient presents for follow-up. She was hospitalized for sepsis secondary to right lower  extremity cellulitis On 12/23. She states she is currently at a SNF. She states that she was started on doxycycline this morning for her right great toe swelling and redness. She is not sure what dressings have been done to her left lower extremity for the past 3 weeks. She says its been mainly gauze with an Ace wrap. 1/25; patient presents for follow-up. She is still residing in a skilled nursing facility. She reports mild pain to the left lower extremity wound bed. She states she is going to see a podiatrist soon. 2/8; patient presents for follow-up. She has moved back to her residential community from her skilled nursing facility. She has no issues or complaints today. She denies signs of systemic infections. 2/15; patient presents for follow-up. He has  no issues or complaints today. She denies systemic signs of infection. 2/22; patient presents for follow-up. She has no issues or complaints today. She denies signs of infection. 3/1; patient presents for follow-up. She states that home health came out the day after she was seen in our clinic and yesterday to do the wrap change. She denies signs of infection. She reports excoriated skin on the ankle. 3/8; patient presents for follow-up. She has no issues or complaints today. She denies signs of infection. 3/15; patient presents for follow-up. Home health has been coming out to change the dressings. She reports more tenderness to the wound site. She denies purulent drainage, increased warmth or erythema to the area. 4/5; patient presents for follow-up. She has missed her last 2 clinic appointments. I have not seen her in 3 weeks. She was recently hospitalized for altered mental status. She was involuntarily committed. She was evaluated by psychiatry and deemed to have competency. There was no specific cause of her altered mental status. It was concluded that her physical and mental health were declining due to her chronic medical conditions.  Currently home health has been coming out for dressing changes. Patient has also been doing her own dressing changes. She reports more skin breakdown to the periwound and now has a new wound. She denies fever/chills. She reports continued tenderness to the wound site. 4/12; patient with significant venous insufficiency and a large wound on her left lower leg taking up about 80% of the circumference of her lower leg. Cultures of this grew MRSA and Pseudomonas. She had completed a course of ciprofloxacin now is starting doxycycline. She has been using Dakin's wet-to-dry and a Tubigrip. She has home health twice a week and we change it once. 4/19; patient presents for follow-up. She completed her course of doxycycline. She has been using Dakin's wet-to-dry dressing and Tubigrip. Home health changes the dressing twice weekly. Currently she has no issues or complaints. 4/26; patient presents for follow-up. At last clinic visit orders for home health were Iodosorb under compression therapy. Unfortunately they did not have the dressing and have been using Dakin's and gentamicin under the wrap. Patient currently denies signs of infection. She has no issues or complaints today. 5/3; patient presents for follow-up. Again Iodosorb has not been used under the compression therapy when home health comes out to change the wrap and dressing. They have been using Sorbact. It is unclear why this is happening since we send orders weekly to the agency. She denies signs of infection. Patient has not purchased the [place] antibiotics. We reached out to the company and they said they have been trying to contact her on a regular basis. We gave the patient the number to call to order the medication. 5/10; patient presents for follow-up. She has no issues or complaints today. Again home health has not been using Iodosorb. Mepilex was on the wound bed. No other dressings noted. She brought in her Keystone antibiotics. She  denies signs of infection. 5/17; patient presents for follow-up. Home health has come out twice since she was last seen. Joint well she has been using Keystone antibiotic with Sorbact under the compression wrap. She has no issues or complaints today. She denies signs of infection. 5/24; patient presents for follow-up. We have been using Keystone antibiotics with Sorbact under compression therapy. She is tolerating the treatment well. She is reporting improvement in wound healing. She denies signs of infection. 5/31; patient presents for follow-up. We continue to do [place] antibiotics  with Sorbact under compression therapy. She continues to report improvement in wound healing. Home health comes out and changes the dressing once weekly. 05-17-2022 upon evaluation today patient appears to be doing better in regard to her wound especially compared to the last time I saw her. Fortunately I do think that she is seeing improvements. With that being said I do believe that she may be benefit from sharp debridement today to clear away some of the necrotic debris I discussed that with her as well. She is an amendable to that plan. Otherwise she is very pleased with how the [facility] is doing for her. 6/14; patient presents for follow-up. We have been using Keystone antibiotic with Sorbact and absorbent dressings under 3 layer compression. She has no issues or complaints today. She reports improvement in wound healing. She denies signs of infection. 6/21; patient presents for follow-up. We are continuing with [facility] antibiotic and Sorbact under 3 layer compression. Patient has no complaints. Continued wound healing is happening. She denies signs of infection. 6/28; patient presents for follow-up. We have been using Keystone antibiotic with Sorbact under 3 layer compression. Usually home health comes out and changes the dressing twice a week. Unfortunately they did not go out to change the dressing. It is unclear  why. Patient did not call them. She currently denies ROX, MCGRIFF ([id number]) [id number] Page 11 of 16 signs of infection. 7/5; patient presents for follow-up. We have been using Keystone antibiotic with calcium alginate under 3 layer compression. She reports improvement in wound healing. She denies signs of infection. Home health has come out to do dressing changes twice this past week. 7/12; patient presents for follow-up. We have been using Keystone antibiotic with calcium alginate under 3 layer compression. Patient states that home health came out once last week to change the dressing. She reports improvement in wound healing. She currently denies signs of infection. 7/19; patient presents for follow-up. We have been using Keystone antibiotic with calcium alginate under 3 layer compression. Home health came out once last week to change the dressing. She has no issues or complaints today. She denies signs of infection. 8/2; patient presents for follow-up. We have been using Keystone antibiotic with calcium alginate under 3 layer compression. Unfortunately she missed her appointment last week and home health did not come out to do dressing changes. Patient currently denies signs of infection. 8/9; patient presents for follow-up. We have been using Keystone with calcium alginate under 3 layer compression. She states that home health came out once last week. She currently denies signs of infection. Her wrap was completely wet. She states she was cleaning the top of the leg and water soaked down into the wrap. 8/16; patient presents for follow-up. We have been using Keystone with calcium alginate under 3 layer compression. She states that home health came out twice last week. She has no issues or complaints today. 8/23; patient presents for follow-up. He has been using Keystone with calcium alginate under 3 layer compression. Home health came out twice last week.  She denies signs of infection. 8/30; patient presents for follow-up. We have been using Keystone with calcium alginate under 3 layer compression. Home health came out once last week to change the dressing. Patient reports improvement in wound healing. She states she is almost done with her chemotherapy infusions and has 1 more left. 9/13; patient presents for follow-up. She has lost the capsules to her [facility] antibiotic which I believe is the vancomycin pills. She has  her Zosyn powder today. We have been using Keystone antibiotic ointment with calcium alginate under 3 layer compression. She is concerned about systemic infection however her vitals are stable and there is no surrounding soft tissue infection. She would like to remain a patient in our wound care center however would like a second opinion for her wound care at another facility. She asked to be referred to [facility] wound care center. 9/20; patient presents for follow-up. She found her vancomycin capsules and brought in her complete Keystone antibiotic ointment set today. Unfortunately she has developed skin breakdown and Erythema to the right lower extremity With increased swelling. She states she went to a pow wow Over the weekend and was on her feet for extended periods of time. She saw her oncologist yesterday who prescribed her doxycycline for her right lower extremity erythema. 9/27; patient presents for follow-up. We have been using Keystone antibiotic with Aquacel under 3 layer compression to the lower extremities bilaterally. When home health came and changed the wrap she secretly put coffee into the spray mix along with [facility] antibiotic on her leg thinking the acidic component would better activate the zoysn (sonething she discussed with her microbiologist brother). She has reported improvement in wound healing. 10/4; patient presents for follow-up. She has no issues or complaints today. We have been doing Aquacel and keystone  under 3 layer compression to the lower extremities bilaterally. This morning she took the right lower extremity wrap off as it was uncomfortable. She has no open wounds to this leg. 10/11; patient presents for follow-up. We have been doing Aquacel with Keystone antibiotic ointment under 3 layer compression to the left lower extremity. She developed a small blister to the anterior aspect of the left leg noticed when the wrap was taken off on intake. She currently denies signs of infection. 10/18; patient presents for follow-up. We have been doing Aquacel with Keystone antibiotic ointment under 3 layer compression to the left lower extremity. There has been continued improvement in wound healing. She denies signs of infection. 10/25; patient arrives for treatment of venous insufficiency ulcers on her left lower leg both lateral and medial are remanence of apparently a circumferential wound. Much improved. We are using topicals Keystone and Aquacel Ag under 3 layer compression we continue to make good progress. The patient talk to me at some length with regards to different things she has on her forehead and her Peri orbital area for which she is apparently applying [place]. She feels that what ever we are treating on her wounds is a more systemic problem. I really was not able to get a handle on what she is talking about however I did caution her not to put the Dresser in her eyes. 11/1; her wounds continue to improve she is using Keystone and Aquacel Ag G under 3 layer compression. Our intake nurse notes erythema and edema in the right leg. The patient has a litany of concerns with regards to a rash on her forehead or ears and other systemic complaints. She has an appointment with dermatology on November 11 11/8; patient presents for follow-up. We have been using Keystone and Aquacel under 4-layer compression. She has no issues or complaints today. She reports improvement in wound healing. 11/15;  patient presents for follow-up. We have been using Aquacel with Keystone antibiotic under 3 layer compression. Patient continues improvement in wound healing. 12/6; patient presents for follow-up. We have been using Aquacel with Keystone antibiotic ointment under 3 layer compression. Wounds appear  well-healing. 12/13; patient presents for follow-up. We have been using Aquacel with Keystone antibiotic under 3 layer compression. She has no issues or complaints today. 12/27 left lateral medial ankle. Superficial wounds remain there is significantly improved we are using Keystone backed with Zetuvit under 4-layer compression 1/3; patient presents for follow-up. Her wounds appear well-healing. We have been using Aquacel Ag with Keystone antibiotic ointment under compression therapy. This should be a 3 layer compression. 1/17; patient presents for follow-up. Her wounds on her left lower extremity are well-healing. We are using Aquacel Ag and Keystone antibiotic ointment under compression therapy. She missed her last clinic appointment. She states that the wrap has been changed twice weekly since she was last seen. Unfortunately she has developed increased warmth and redness to the right lower extremity consistent with cellulitis. She states this started a few days ago. 1/24; patient presents for follow-up. Her wounds on the left lower extremity are well-healing with Aquacel and Keystone antibiotic ointment under compression therapy. I prescribed Keflex at last clinic visit for cellulitis of the right lower extremity. Unfortunately this has not resolved. She did not follow-up with her PCP for contact our office about her symptoms. 1/31; patient presents for follow-up. We have been using Aquacel and Keystone antibiotic ointment under compression therapy to the left lower extremity. Wounds appear well-healing. She has been taking clindamycin for the past week. Her symptoms have improved slightly with a decrease  in erythema and warmth to the right lower extremity. She says her pain level has improved. However Symptoms have not completely resolved. She denies fever/chills, nausea/vomiting. 2/7; patient presents for follow-up. We have been using Aquacel Ag with Keystone antibiotic ointment under compression therapy to the left lower extremity. For the past week home health has not been using Keystone antibiotic ointment. She continues to take clindamycin. Her symptoms have improved greatly with the decrease in erythema and warmth to the right lower extremity. She denies systemic signs of infection. She has an area of skin breakdown to the right anterior leg. QUINNE, PIRES ([id number]) [id number] Page 12 of 16 2/14; Patient presents for follow-up. She has been using Hydrofera Blue to the right anterior leg under Tubigrip. It looks like Hydrofera Blue is also being used with Keystone to the left lower extremity under compression therapy. Order is for Aqualcell Ag. Overall wounds appear well healing. She has no issues or complaints. 2/21; patient presents for follow-up. We have been using Hydrofera Blue under 3 layer compression to the lower extremities bilaterally. Her right lower extremity wounds have healed. She has a small open wound remaining to her left lateral leg. 2/28; patient presents for follow-up. We have been using Hydrofera Blue under 3 layer compression. Home health has changed the dressing to the left lower extremity however started the wrap at the ankle. 3/6; patient presents for follow-up. We have been using PolyMem with antibiotic ointment under 3 layer compression to the left lower extremity. She has no issues or complaints today. 3/12; patient presents for follow-up. We have been using Aquacel Ag under 3 layer compression to the left lower extremity. Her wound is healed. She has juxta lite compression wraps at home. Readmission: 03-13-2023 upon evaluation  today patient appears to be doing poorly in regard to her lower extremities. Specifically it is the left lower extremity at this point that is causing her problems she has multiple wounds open. With that being said she does not sound like she has been wearing her compression stockings on a regular  basis she has previously seen Dr. Mikey Bussing. With that being said upon evaluation today she tells me that since the wounds have reopened that she has been having a lot of discomfort she also tells me that she went back to using some of the dressings that she had leftover at home she does not know exactly what everything was. Nonetheless she also tells me that she has made an appointment with a dermatologist in town and has to see them tomorrow therefore she is not going to allow [place] to wrap her legs today since "that did not work anyway". With that being said obviously she did improve last time we got her wound healed we order her juxta lite compression wraps again it does not sound like she has been using those appropriately at home. 4/10; patient presents for follow-up. Unfortunately she reopened last week and was seen by [facility]. At that time the patient declined in office wraps. She states she is wearing her juxta lite compression wraps however she did not have these on today. 4/17; patient presents for follow-up. At last clinic visit a PCR culture was done that grew Staph aureus and Enterobacter cola CA and Enterococcus bacillus. Keystone antibiotic ointment was ordered. She has not received this yet. We have been using antibiotic ointment with Hydrofera Blue under compression therapy. There is been improvement in her wound healing. She has no issues or complaints today. 4/24; patient presents for follow-up. She received Keystone antibiotic ointment and this was started 3 days ago when home health came to change the wrap. She has no issues or complaints today. We have been using Aquacel along with compression  wrap as well. Objective Constitutional Vitals Time Taken: 9:30 AM, Height: 66 in, Weight: 153 lbs, BMI: 24.7, Temperature: 97.6 F, Pulse: 101 bpm, Respiratory Rate: 18 breaths/min, Blood Pressure: 154/78 mmHg. General Notes: Multiple open wounds to the left lower extremity with nonviable tissue. No signs of surrounding infection. 3+ pitting edema to the knee. Integumentary (Hair, Skin) Wound #10 status is Open. Original cause of wound was Gradually Appeared. The date acquired was: 03/04/2023. The wound has been in treatment 3 weeks. The wound is located on the Left,Posterior Lower Leg. The wound measures 7.4cm length x 6cm width x 0.2cm depth; 34.872cm^2 area and 6.974cm^3 volume. There is Fat Layer (Subcutaneous Tissue) exposed. There is a medium amount of serosanguineous drainage noted. The wound margin is distinct with the outline attached to the wound base. There is small (1-33%) red granulation within the wound bed. There is a large (67-100%) amount of necrotic tissue within the wound bed. Wound #11 status is Open. Original cause of wound was Gradually Appeared. The date acquired was: 03/04/2023. The wound has been in treatment 3 weeks. The wound is located on the Left,Lateral Lower Leg. The wound measures 1.8cm length x 1.2cm width x 0.2cm depth; 1.696cm^2 area and 0.339cm^3 volume. There is Fat Layer (Subcutaneous Tissue) exposed. There is a small amount of serosanguineous drainage noted. The wound margin is distinct with the outline attached to the wound base. There is large (67-100%) red granulation within the wound bed. There is a small (1-33%) amount of necrotic tissue within the wound bed including Adherent Slough. Wound #12 status is Open. Original cause of wound was Gradually Appeared. The date acquired was: 03/04/2023. The wound has been in treatment 3 weeks. The wound is located on the Left,Medial Ankle. The wound measures 2cm length x 1.9cm width x 0.2cm depth; 2.985cm^2 area and  0.597cm^3 volume. There  is Fat Layer (Subcutaneous Tissue) exposed. There is a medium amount of serosanguineous drainage noted. The wound margin is distinct with the outline attached to the wound base. There is small (1-33%) pink granulation within the wound bed. There is a large (67-100%) amount of necrotic tissue within the wound bed including Adherent Slough. Wound #13 status is Open. Original cause of wound was Not Known. The date acquired was: 02/14/2023. The wound is located on the Left,Lateral,Posterior Lower Leg. The wound measures 6cm length x 4cm width x 0.2cm depth; 18.85cm^2 area and 3.77cm^3 volume. There is Fat Layer (Subcutaneous Tissue) exposed. There is a medium amount of serosanguineous drainage noted. There is medium (34-66%) red granulation within the wound bed. There is a small (1-33%) amount of necrotic tissue within the wound bed including Adherent Slough. Assessment Active Problems ICD-10 Non-pressure chronic ulcer of other part of left lower leg with fat layer exposed Chronic venous hypertension (idiopathic) with ulcer of left lower extremity Chronic venous hypertension (idiopathic) with ulcer of right lower extremity BRECKLYN, GALVIS ([id number]) [id number] Page 13 of 16 Venous insufficiency (chronic) (peripheral) Long term (current) use of anticoagulants Essential (primary) hypertension Secondary malignant neoplasm of breast Cellulitis of right lower limb Unspecified open wound, right lower leg, initial encounter Patient's wounds have healthier granulation tissue present. I debrided nonviable tissue. I recommended continuing the course with [facility] antibiotic ointment and Aquacel under 3 layer compression. Follow-up in 1 week. Procedures Wound #10 Pre-procedure diagnosis of Wound #10 is a Venous Leg Ulcer located on the Left,Posterior Lower Leg .Severity of Tissue Pre Debridement is: Fat layer exposed. There was a Excisional  Skin/Subcutaneous Tissue Debridement with a total area of 34.85 sq cm performed by Laura Corwin, MD. With the following instrument(s): Curette Material removed includes Subcutaneous Tissue and Slough and. A time out was conducted at 09:45, prior to the start of the procedure. A Minimum amount of bleeding was controlled with Pressure. The procedure was tolerated well. Post Debridement Measurements: 7.4cm length x 6cm width x 0.2cm depth; 6.974cm^3 volume. Character of Wound/Ulcer Post Debridement is stable. Severity of Tissue Post Debridement is: Fat layer exposed. Post procedure Diagnosis Wound #10: Same as Pre-Procedure Pre-procedure diagnosis of Wound #10 is a Venous Leg Ulcer located on the Left,Posterior Lower Leg . There was a Three Layer Compression Therapy Procedure with a pre-treatment ABI of 1.5 by Laura Santiago. Post procedure Diagnosis Wound #10: Same as Pre-Procedure Wound #11 Pre-procedure diagnosis of Wound #11 is a Venous Leg Ulcer located on the Left,Lateral Lower Leg .Severity of Tissue Pre Debridement is: Fat layer exposed. There was a Selective/Open Wound Non-Viable Tissue Debridement with a total area of 1.7 sq cm performed by Laura Corwin, MD. With the following instrument(s): Curette to remove Viable and Non-Viable tissue/material. Material removed includes Slough. A time out was conducted at 09:46, prior to the start of the procedure. A Minimum amount of bleeding was controlled with Pressure. The procedure was tolerated well. Post Debridement Measurements: 1.8cm length x 1.2cm width x 0.2cm depth; 0.339cm^3 volume. Character of Wound/Ulcer Post Debridement is stable. Severity of Tissue Post Debridement is: Fat layer exposed. Post procedure Diagnosis Wound #11: Same as Pre-Procedure Wound #12 Pre-procedure diagnosis of Wound #12 is a Venous Leg Ulcer located on the Left,Medial Ankle .Severity of Tissue Pre Debridement is: Fat layer exposed. There was a Excisional  Skin/Subcutaneous Tissue Debridement with a total area of 2.98 sq cm performed by Laura Corwin, MD. With the following instrument(s): Curette to remove Viable and Non-Viable  tissue/material. Material removed includes Subcutaneous Tissue and Slough and. A time out was conducted at 09:48, prior to the start of the procedure. A Minimum amount of bleeding was controlled with Pressure. The procedure was tolerated well. Post Debridement Measurements: 2cm length x 1.9cm width x 0.2cm depth; 0.597cm^3 volume. Character of Wound/Ulcer Post Debridement is stable. Severity of Tissue Post Debridement is: Fat layer exposed. Post procedure Diagnosis Wound #12: Same as Pre-Procedure Wound #13 Pre-procedure diagnosis of Wound #13 is a Venous Leg Ulcer located on the Left,Lateral,Posterior Lower Leg .Severity of Tissue Pre Debridement is: Fat layer exposed. There was a Excisional Skin/Subcutaneous Tissue Debridement with a total area of 18.84 sq cm performed by Laura Corwin, MD. With the following instrument(s): Curette to remove Viable and Non-Viable tissue/material. Material removed includes Subcutaneous Tissue and Slough and. A time out was conducted at 09:45, prior to the start of the procedure. A Minimum amount of bleeding was controlled with Pressure. The procedure was tolerated well. Post Debridement Measurements: 6cm length x 4cm width x 0.2cm depth; 3.77cm^3 volume. Character of Wound/Ulcer Post Debridement is stable. Severity of Tissue Post Debridement is: Fat layer exposed. Post procedure Diagnosis Wound #13: Same as Pre-Procedure Plan Follow-up Appointments: Return Appointment in 1 week. Nurse Visit as needed Home Health: Home Health Company: [facility] [phone] **Please direct any NON-WOUND related issues/requests for orders to patient's Primary Care Physician. **If current dressing causes regression in wound condition, may D/C ordered dressing product/s and apply Normal Saline Moist  Dressing daily until next Wound Healing Center or Other MD appointment. **Notify Wound Healing Center of regression in wound condition at [phone]. Bathing/ Shower/ Hygiene: May shower with wound dressing protected with water repellent cover or cast protector. Edema Control - Lymphedema / Segmental Compressive Device / Other: Optional: One layer of unna paste to top of compression wrap (to act as an anchor). Elevate, Exercise Daily and Avoid Standing for Long Periods of Time. Elevate legs to the level of the heart and pump ankles as often as possible Elevate leg(s) parallel to the floor when sitting. DO YOUR BEST to sleep in the bed at night. DO NOT sleep in your recliner. Long hours of sitting in a recliner leads to swelling of the legs and/or potential wounds on your backside. Additional Orders / Instructions: Follow Nutritious Diet and Increase Protein Intake WOUND #10: - Lower Leg Wound Laterality: Left, Posterior Topical: Keystone 3 x Per Week/30 Days JALEXUS, BRETT ([id number]) [id number] Page 14 of 16 Prim Dressing: Aquacel Extra Hydrofiber Dressing, 4x5 (in/in) 3 x Per Week/30 Days ary Secondary Dressing: ABD Pad 5x9 (in/in) 3 x Per Week/30 Days Discharge Instructions: Cover with ABD pad Com pression Wrap: 3-LAYER WRAP - Profore Lite LF 3 Multilayer Compression Bandaging System 3 x Per Week/30 Days Discharge Instructions: Apply 3 multi-layer wrap as prescribed. WOUND #11: - Lower Leg Wound Laterality: Left, Lateral Topical: Keystone 3 x Per Week/30 Days Prim Dressing: Aquacel Extra Hydrofiber Dressing, 4x5 (in/in) 3 x Per Week/30 Days ary Secondary Dressing: ABD Pad 5x9 (in/in) 3 x Per Week/30 Days Discharge Instructions: Cover with ABD pad Com pression Wrap: 3-LAYER WRAP - Profore Lite LF 3 Multilayer Compression Bandaging System 3 x Per Week/30 Days Discharge Instructions: Apply 3 multi-layer wrap as prescribed. WOUND #12: - Ankle Wound  Laterality: Left, Medial Topical: Keystone 3 x Per Week/30 Days Prim Dressing: Aquacel Extra Hydrofiber Dressing, 4x5 (in/in) 3 x Per Week/30 Days ary Secondary Dressing: ABD Pad 5x9 (in/in) 3 x Per Week/30 Days Discharge  Instructions: Cover with ABD pad Com pression Wrap: 3-LAYER WRAP - Profore Lite LF 3 Multilayer Compression Bandaging System 3 x Per Week/30 Days Discharge Instructions: Apply 3 multi-layer wrap as prescribed. 1. In office sharp debridement 2. Keystone antibiotic ointment with Aquacel under 3 layer compressionooleft lower extremity 3. Compression stocking to the right lower extremity 4. Follow-up in 1 week Electronic Signature(s) Signed: 04/04/2023 10:24:22 AM By: Laura Corwin DO Entered By: Laura Santiago on 04/04/2023 10:17:15 -------------------------------------------------------------------------------- ROS/PFSH Details Patient Name: Date of Service: Laura Muscat [name] 04/04/2023 9:15 A M Medical Record Number: [id number] Patient Account Number: [id number] Date of Birth/Sex: Treating RN: [birth year] (77 y.o. Laura Santiago Primary Care Provider: Marisue Santiago Other Clinician: Betha Santiago Referring Provider: Treating Provider/Extender: Laura Santiago in Treatment: 14 Information Obtained From Patient Eyes Medical History: Negative for: Cataracts; Glaucoma; Optic Neuritis Ear/Nose/Mouth/Throat Medical History: Negative for: Chronic sinus problems/congestion; Middle ear problems Hematologic/Lymphatic Medical History: Negative for: Anemia; Hemophilia; Human Immunodeficiency Virus; Lymphedema; Sickle Cell Disease Respiratory Medical History: Negative for: Aspiration; Asthma; Chronic Obstructive Pulmonary Disease (COPD); Pneumothorax; Sleep Apnea; Tuberculosis Cardiovascular Medical History: Positive for: Hypertension Negative for: Angina; Arrhythmia; Congestive Heart Failure; Coronary Artery Disease; Deep Vein  Thrombosis; Hypotension; Myocardial Infarction; Peripheral Arterial Disease; Peripheral Venous Disease; Phlebitis; Vasculitis Gastrointestinal Medical History: Negative for: Cirrhosis ; Colitis; Crohns; Hepatitis A; Hepatitis B; Hepatitis KRITIKA, STUKES ([id number]) [id number] Page 15 of 16 Endocrine Medical History: Negative for: Type I Diabetes; Type II Diabetes Genitourinary Medical History: Negative for: End Stage Renal Disease Immunological Medical History: Negative for: Lupus Erythematosus; Raynauds; Scleroderma Integumentary (Skin) Medical History: Negative for: History of Burn; History of pressure wounds Musculoskeletal Medical History: Positive for: Osteoarthritis Negative for: Gout; Rheumatoid Arthritis; Osteomyelitis Oncologic Medical History: Positive for: Received Chemotherapy; Received Radiation Past Medical History Notes: breast cancer Immunizations Pneumococcal Vaccine: Received Pneumococcal Vaccination: No Implantable Devices None Family and Social History Never smoker Psychologist, prison and probation services) Signed: 04/04/2023 10:24:22 AM By: Laura Corwin DO Signed: 04/04/2023 4:54:10 PM By: Laura Gurney, BSN, RN, CWS, [name] RN, BSN Entered By: Laura Santiago on 04/04/2023 10:18:04 -------------------------------------------------------------------------------- SuperBill Details Patient Name: Date of Service: Laura Muscat [name] 04/04/2023 Medical Record Number: [id number] Patient Account Number: [id number] Date of Birth/Sex: Treating RN: [birth year] (77 y.o. 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 I87.311 Chronic venous hypertension (idiopathic) with ulcer of right lower extremity I87.2 Venous insufficiency (chronic) (peripheral) Z79.01 Long term (current) use of anticoagulants I10 Essential (primary) hypertension 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 Cellulitis of right lower limb MIKHIA, DUSEK ([id number]) [id number] Page 16 of 16 S81.801A Unspecified open wound, right lower leg, initial encounter Facility Procedures : CPT4 Code: [id number] Description: 11042 - DEB SUBQ TISSUE 20 SQ CM/< ICD-10 Diagnosis Description 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 Modifier: d Quantity: 1 : CPT4 Code: [id number] Description: 11045 - DEB SUBQ TISS EA ADDL 20CM ICD-10 Diagnosis Description 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 Modifier: d Quantity: 2 : CPT4 Code:  [id number] Description: [phone] - DEBRIDE WOUND 1ST 20 SQ CM OR < ICD-10 Diagnosis Description 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 Modifier: d Quantity: 1 Physician Procedures : CPT4 Code Description Modifier [id number] 11042 - WC PHYS SUBQ TISS 20 SQ CM ICD-10 Diagnosis Description 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.312 Chronic venous hypertension (idiopathic) with ulcer  of left lower extremity Quantity: 1 : [id number] 11045 - WC PHYS SUBQ TISS EA ADDL 20 CM ICD-10 Diagnosis Description 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.312 Chronic venous  hypertension (idiopathic) with ulcer of left lower extremity Quantity: 2 : [id number] 97597 - WC PHYS DEBR WO ANESTH 20 SQ CM ICD-10 Diagnosis Description 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

## 2023-04-06 ENCOUNTER — Inpatient Hospital Stay: Payer: Medicare Other | Admitting: Oncology

## 2023-04-06 ENCOUNTER — Inpatient Hospital Stay: Payer: Medicare Other

## 2023-04-06 DIAGNOSIS — C50919 Malignant neoplasm of unspecified site of unspecified female breast: Secondary | ICD-10-CM

## 2023-04-06 DIAGNOSIS — Z5111 Encounter for antineoplastic chemotherapy: Secondary | ICD-10-CM | POA: Diagnosis not present

## 2023-04-06 MED ORDER — SODIUM CHLORIDE 0.9% FLUSH
10.0000 mL | INTRAVENOUS | Status: DC | PRN
  Administered 2023-04-06: 10 mL
  Filled 2023-04-06: qty 10

## 2023-04-06 MED ORDER — DIPHENHYDRAMINE HCL 25 MG PO CAPS
50.0000 mg | ORAL_CAPSULE | Freq: Once | ORAL | Status: AC
  Administered 2023-04-06: 50 mg via ORAL
  Filled 2023-04-06: qty 2

## 2023-04-06 MED ORDER — SODIUM CHLORIDE 0.9 % IV SOLN
Freq: Once | INTRAVENOUS | Status: AC
  Filled 2023-04-06: qty 250

## 2023-04-06 MED ORDER — TRASTUZUMAB-DKST CHEMO 150 MG IV SOLR
8.0000 mg/kg | Freq: Once | INTRAVENOUS | Status: AC
  Administered 2023-04-06: 504 mg via INTRAVENOUS
  Filled 2023-04-06: qty 24

## 2023-04-06 MED ORDER — HEPARIN SOD (PORK) LOCK FLUSH 100 UNIT/ML IV SOLN
500.0000 [IU] | Freq: Once | INTRAVENOUS | Status: AC | PRN
  Administered 2023-04-06: 500 [IU]
  Filled 2023-04-06: qty 5

## 2023-04-06 MED ORDER — ACETAMINOPHEN 325 MG PO TABS
650.0000 mg | ORAL_TABLET | Freq: Once | ORAL | Status: AC
  Administered 2023-04-06: 650 mg via ORAL
  Filled 2023-04-06: qty 2

## 2023-04-06 MED ORDER — SODIUM CHLORIDE 0.9 % IV SOLN
840.0000 mg | Freq: Once | INTRAVENOUS | Status: AC
  Administered 2023-04-06: 840 mg via INTRAVENOUS
  Filled 2023-04-06: qty 28

## 2023-04-06 NOTE — Patient Instructions (Signed)
[facility]  Discharge Instructions: Thank you for choosing [facility] to provide your oncology and hematology care.  If you have a lab appointment with the Cancer Center, please go directly to the Cancer Center and check in at the registration area.  Wear comfortable clothing and clothing appropriate for easy access to any Portacath or PICC line.   We strive to give you quality time with your provider. You may need to reschedule your appointment if you arrive late (15 or more minutes).  Arriving late affects you and other patients whose appointments are after yours.  Also, if you miss three or more appointments without notifying the office, you may be dismissed from the clinic at the provider's discretion.      For prescription refill requests, have your pharmacy contact our office and allow 72 hours for refills to be completed.    Today you received the following chemotherapy and/or immunotherapy agents herceptin, perjeta      To help prevent nausea and vomiting after your treatment, we encourage you to take your nausea medication as directed.  BELOW ARE SYMPTOMS THAT SHOULD BE REPORTED IMMEDIATELY: *FEVER GREATER THAN 100.4 F (38 C) OR HIGHER *CHILLS OR SWEATING *NAUSEA AND VOMITING THAT IS NOT CONTROLLED WITH YOUR NAUSEA MEDICATION *UNUSUAL SHORTNESS OF BREATH *UNUSUAL BRUISING OR BLEEDING *URINARY PROBLEMS (pain or burning when urinating, or frequent urination) *BOWEL PROBLEMS (unusual diarrhea, constipation, pain near the anus) TENDERNESS IN MOUTH AND THROAT WITH OR WITHOUT PRESENCE OF ULCERS (sore throat, sores in mouth, or a toothache) UNUSUAL RASH, SWELLING OR PAIN  UNUSUAL VAGINAL DISCHARGE OR ITCHING   Items with * indicate a potential emergency and should be followed up as soon as possible or go to the Emergency Department if any problems should occur.  Please show the CHEMOTHERAPY ALERT CARD or IMMUNOTHERAPY ALERT CARD at  check-in to the Emergency Department and triage nurse.  Should you have questions after your visit or need to cancel or reschedule your appointment, please contact [facility] [place] [facility]  [phone] and follow the prompts.  Office hours are 8:00 a.m. to 4:30 p.m. Monday - Friday. Please note that voicemails left after 4:00 p.m. may not be returned until the following business day.  We are closed weekends and major holidays. You have access to a nurse at all times for urgent questions. Please call the main number to the clinic [phone] and follow the prompts.  For any non-urgent questions, you may also contact your provider using MyChart. We now offer e-Visits for anyone 18 and older to request care online for non-urgent symptoms. For details visit [email or web address].   Also download the MyChart app! Go to the app store, search "MyChart", open the app, select [facility], and log in with your MyChart username and password.    

## 2023-04-06 NOTE — Progress Notes (Signed)
Last treatment in October for herceptin/perjeta - MD wants to reload but ok to give infusions over regular infusion times

## 2023-04-06 NOTE — Progress Notes (Unsigned)
No concerns. 

## 2023-04-09 ENCOUNTER — Telehealth: Payer: Self-pay | Admitting: Nurse Practitioner

## 2023-04-09 ENCOUNTER — Other Ambulatory Visit: Payer: Self-pay | Admitting: Hospice and Palliative Medicine

## 2023-04-09 ENCOUNTER — Other Ambulatory Visit: Payer: Self-pay | Admitting: Oncology

## 2023-04-09 DIAGNOSIS — C50919 Malignant neoplasm of unspecified site of unspecified female breast: Secondary | ICD-10-CM

## 2023-04-09 NOTE — Telephone Encounter (Signed)
I called Laura Santiago dtg, left a message to schedule f/u pc visit, attempted to contact Laura S[facility] phone did not allow to leave a message.

## 2023-04-10 ENCOUNTER — Other Ambulatory Visit: Payer: Self-pay

## 2023-04-11 ENCOUNTER — Ambulatory Visit: Payer: Medicare Other | Admitting: Internal Medicine

## 2023-04-11 ENCOUNTER — Other Ambulatory Visit: Payer: Self-pay | Admitting: *Deleted

## 2023-04-11 ENCOUNTER — Ambulatory Visit (HOSPITAL_BASED_OUTPATIENT_CLINIC_OR_DEPARTMENT_OTHER): Payer: Medicare Other | Admitting: Cardiology

## 2023-04-11 MED ORDER — FENTANYL 25 MCG/HR TD PT72: MEDICATED_PATCH | TRANSDERMAL | 0 refills | Status: DC

## 2023-04-13 ENCOUNTER — Telehealth (HOSPITAL_BASED_OUTPATIENT_CLINIC_OR_DEPARTMENT_OTHER): Payer: Medicare Other | Admitting: Cardiology

## 2023-04-13 ENCOUNTER — Other Ambulatory Visit: Payer: Self-pay | Admitting: Oncology

## 2023-04-13 MED ORDER — LORAZEPAM 0.5 MG PO TABS: 0.5000 mg | ORAL_TABLET | Freq: Every day | ORAL | 0 refills | Status: AC | PRN

## 2023-04-14 ENCOUNTER — Other Ambulatory Visit: Payer: Self-pay | Admitting: Oncology

## 2023-04-18 ENCOUNTER — Encounter: Payer: Self-pay | Admitting: Oncology

## 2023-04-18 ENCOUNTER — Encounter: Payer: Medicare Other | Attending: Internal Medicine | Admitting: Internal Medicine

## 2023-04-18 DIAGNOSIS — Z86718 Personal history of other venous thrombosis and embolism: Secondary | ICD-10-CM | POA: Diagnosis not present

## 2023-04-18 DIAGNOSIS — N184 Chronic kidney disease, stage 4 (severe): Secondary | ICD-10-CM | POA: Insufficient documentation

## 2023-04-18 DIAGNOSIS — X58XXXA Exposure to other specified factors, initial encounter: Secondary | ICD-10-CM | POA: Diagnosis not present

## 2023-04-18 DIAGNOSIS — C50912 Malignant neoplasm of unspecified site of left female breast: Secondary | ICD-10-CM | POA: Diagnosis not present

## 2023-04-18 DIAGNOSIS — Z7901 Long term (current) use of anticoagulants: Secondary | ICD-10-CM | POA: Diagnosis not present

## 2023-04-18 DIAGNOSIS — I87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: Secondary | ICD-10-CM | POA: Insufficient documentation

## 2023-04-18 DIAGNOSIS — I129 Hypertensive chronic kidney disease with stage 1 through stage 4 chronic kidney disease, or unspecified chronic kidney disease: Secondary | ICD-10-CM | POA: Diagnosis not present

## 2023-04-18 DIAGNOSIS — L97822 Non-pressure chronic ulcer of other part of left lower leg with fat layer exposed: Secondary | ICD-10-CM | POA: Insufficient documentation

## 2023-04-18 DIAGNOSIS — S81801A Unspecified open wound, right lower leg, initial encounter: Secondary | ICD-10-CM | POA: Insufficient documentation

## 2023-04-18 DIAGNOSIS — I87311 Chronic venous hypertension (idiopathic) with ulcer of right lower extremity: Secondary | ICD-10-CM | POA: Diagnosis not present

## 2023-04-18 DIAGNOSIS — I872 Venous insufficiency (chronic) (peripheral): Secondary | ICD-10-CM | POA: Diagnosis not present

## 2023-04-18 DIAGNOSIS — L03115 Cellulitis of right lower limb: Secondary | ICD-10-CM | POA: Insufficient documentation

## 2023-04-18 DIAGNOSIS — C7951 Secondary malignant neoplasm of bone: Secondary | ICD-10-CM | POA: Insufficient documentation

## 2023-04-18 DIAGNOSIS — Z923 Personal history of irradiation: Secondary | ICD-10-CM | POA: Diagnosis not present

## 2023-04-18 DIAGNOSIS — M1990 Unspecified osteoarthritis, unspecified site: Secondary | ICD-10-CM | POA: Diagnosis not present

## 2023-04-18 MED ORDER — OXYCODONE HCL 10 MG PO TABS: 15.0000 mg | ORAL_TABLET | Freq: Four times a day (QID) | ORAL | 0 refills | Status: DC | PRN

## 2023-04-19 ENCOUNTER — Encounter
Admission: RE | Admit: 2023-04-19 | Discharge: 2023-04-19 | Disposition: A | Discharge status: Home / Self Care | Payer: Medicare Other | Source: Ambulatory Visit | Attending: Oncology | Admitting: Oncology

## 2023-04-19 DIAGNOSIS — R530 Neoplastic (malignant) related fatigue: Secondary | ICD-10-CM | POA: Diagnosis present

## 2023-04-19 DIAGNOSIS — C7951 Secondary malignant neoplasm of bone: Secondary | ICD-10-CM | POA: Diagnosis present

## 2023-04-19 MED ORDER — TECHNETIUM TC 99M MEDRONATE IV KIT
20.0000 | PACK | Freq: Once | INTRAVENOUS | Status: AC | PRN
  Administered 2023-04-19: 22.16 via INTRAVENOUS

## 2023-04-19 NOTE — Progress Notes (Signed)
[name], [name] ([id number]) [id number] Page 1 of 12 Visit Report for 04/18/2023 Arrival Information Details Patient Name: Date of Service: Laura Santiago. 04/18/2023 10:00 A M Medical Record Number: [id number] Patient Account Number: [id number] Date of Birth/Sex: Treating RN: May 17, 1946 (77 y.o. Ginette Pitman Primary Care Nikita Surman: Marisue Ivan Other Clinician: Betha Loa Referring Kathlyne Loud: Treating Jarielys Girardot/Extender: RO BSO N, MICHA EL Burnard Bunting in Treatment: 64 Visit Information History Since Last Visit All ordered tests and consults were completed: No Patient Arrived: Laura Santiago Added or deleted any medications: No Arrival Time: 09:56 Any new allergies or adverse reactions: No Transfer Assistance: None Had a fall or experienced change in No Patient Identification Verified: Yes activities of daily living that may affect Secondary Verification Process Completed: Yes risk of falls: Patient Requires Transmission-Based No Signs or symptoms of abuse/neglect since last visito No Precautions: Hospitalized since last visit: No Patient Has Alerts: Yes Implantable device outside of the clinic excluding No Patient Alerts: PT HAS SERVICE cellular tissue based products placed in the center ANIMAL since last visit: ABI 07/11/21 Has Dressing in Place as Prescribed: Yes R) 1.16 L) 1.27 Has Compression in Place as Prescribed: Yes Pain Present Now: No Electronic Signature(s) Signed: 04/18/2023 4:34:51 PM By: Betha Loa Entered By: Betha Loa on 04/18/2023 09:56:40 -------------------------------------------------------------------------------- Clinic Level of Care Assessment Details Patient Name: Date of Service: Laura Santiago. 04/18/2023 10:00 A M Medical Record Number: [id number] Patient Account Number: [id number] Date of Birth/Sex: Treating RN: 1946/05/05 (77 y.o. Ginette Pitman Primary Care Jaycub Noorani: Marisue Ivan Other Clinician: Betha Loa Referring Lindy Garczynski: Treating Janine Reller/Extender: RO BSO N, MICHA EL Burnard Bunting in Treatment: 18 Clinic Level of Care Assessment Items TOOL 1 Quantity Score []  - 0 Use when EandM and Procedure is performed on INITIAL visit ASSESSMENTS - Nursing Assessment / Reassessment []  - 0 General Physical Exam (combine w/ comprehensive assessment (listed just below) when performed on new pt. evals) []  - 0 Comprehensive Assessment (HX, ROS, Risk Assessments, Wounds Hx, etc.) ASSESSMENTS - Wound and Skin Assessment / Reassessment []  - 0 Dermatologic / Skin Assessment (not related to wound area) ASSESSMENTS - Ostomy and/or Continence Assessment and Care []  - 0 Incontinence Assessment and Management []  - 0 Ostomy Care Assessment and Management (repouching, etc.) PROCESS - Coordination of Care []  - 0 Simple Patient / Family Education for ongoing care []  - 0 Complex (extensive) Patient / Family Education for ongoing care []  - 0 Staff obtains Consents, Records, T Results / Process Orders est []  - 0 Staff telephones HHA, Nursing Homes / Clarify orders / etc []  - 0 Routine Transfer to another Facility (non-emergent condition) []  - 0 Routine Hospital Admission (non-emergent condition) Laura Santiago ([id number]) [id number] Page 2 of 12 []  - 0 New Admissions / Manufacturing engineer / Ordering NPWT Apligraf, etc. , []  - 0 Emergency Hospital Admission (emergent condition) PROCESS - Special Needs []  - 0 Pediatric / Minor Patient Management []  - 0 Isolation Patient Management []  - 0 Hearing / Language / Visual special needs []  - 0 Assessment of Community assistance (transportation, D/C planning, etc.) []  - 0 Additional assistance / Altered mentation []  - 0 Support Surface(s) Assessment (bed, cushion, seat, etc.) INTERVENTIONS - Miscellaneous []  - 0 External ear exam []  - 0 Patient Transfer (multiple  staff / Nurse, adult / Similar devices) []  - 0 Simple Staple / Suture removal (25 or less) []  - 0 Complex Staple / Suture removal (26 or  more) []  - 0 Hypo/Hyperglycemic Management (do not check if billed separately) []  - 0 Ankle / Brachial Index (ABI) - do not check if billed separately Has the patient been seen at the hospital within the last three years: Yes Total Score: 0 Level Of Care: ____ Electronic Signature(s) Signed: 04/18/2023 4:34:51 PM By: Betha Loa Entered By: Betha Loa on 04/18/2023 10:33:29 -------------------------------------------------------------------------------- Compression Therapy Details Patient Name: Date of Service: Laura Muscat [name] 04/18/2023 10:00 A M Medical Record Number: [id number] Patient Account Number: [id number] Date of Birth/Sex: Treating RN: Jul 23, 1946 (77 y.o. Ginette Pitman Primary Care Leilene Diprima: Marisue Ivan Other Clinician: Betha Loa Referring Michae Grimley: Treating Payeton Germani/Extender: RO BSO N, MICHA EL Dixie Dials Weeks in Treatment: 93 Compression Therapy Performed for Wound Assessment: Wound #10 Left,Posterior Lower Leg Performed By: Farrel Gordon, [name], Compression Type: Three Layer Pre Treatment ABI: 1.5 Post Procedure Diagnosis Same as Pre-procedure Electronic Signature(s) Signed: 04/18/2023 4:34:51 PM By: Betha Loa Entered By: Betha Loa on 04/18/2023 10:29:53 -------------------------------------------------------------------------------- Encounter Discharge Information Details Patient Name: Date of Service: Laura Santiago, Laura Peters [name] 04/18/2023 10:00 A M Medical Record Number: [id number] Patient Account Number: [id number] Date of Birth/Sex: Treating RN: 1946/08/09 (77 y.o. Ginette Pitman Primary Care Terri Rorrer: Marisue Ivan Other Clinician: Betha Loa Referring Shaye Lagace: Treating Abigial Newville/Extender: RO BSO N, MICHA EL Burnard Bunting in Treatment: 21 Encounter Discharge  Information Items Post Procedure Vitals Discharge Condition: Stable Temperature (F): 97.7 Ambulatory Status: Walker Pulse (bpm): 68 Discharge Destination: Home Respiratory Rate (breaths/min): [address] M ([id number]) [id number] Page 3 of 12 Transportation: Other Blood Pressure (mmHg): 147/71 Accompanied By: self Schedule Follow-up Appointment: Yes Clinical Summary of Care: Electronic Signature(s) Signed: 04/18/2023 4:34:51 PM By: Betha Loa Entered By: Betha Loa on 04/18/2023 10:53:12 -------------------------------------------------------------------------------- Lower Extremity Assessment Details Patient Name: Date of Service: Laura Santiago. 04/18/2023 10:00 A M Medical Record Number: [id number] Patient Account Number: [id number] Date of Birth/Sex: Treating RN: 09-18-1946 (77 y.o. Ginette Pitman Primary Care Yaileen Hofferber: Marisue Ivan Other Clinician: Betha Loa Referring Kallen Mccrystal: Treating Jastin Fore/Extender: RO BSO N, MICHA EL Dixie Dials Weeks in Treatment: 93 Edema Assessment Assessed: [Left: Yes] [Right: No] Edema: [Left: Ye] [Right: s] Calf Left: Right: Point of Measurement: 36 cm From Medial Instep 33.7 cm Ankle Left: Right: Point of Measurement: 12 cm From Medial Instep 24 cm Electronic Signature(s) Signed: 04/18/2023 4:34:51 PM By: Betha Loa Signed: 04/18/2023 5:05:57 PM By: Midge Aver MSN RN CNS WTA Entered By: Betha Loa on 04/18/2023 10:17:54 -------------------------------------------------------------------------------- Multi Wound Chart Details Patient Name: Date of Service: Laura Muscat [name] 04/18/2023 10:00 A M Medical Record Number: [id number] Patient Account Number: [id number] Date of Birth/Sex: Treating RN: Jun 02, 1946 (77 y.o. Ginette Pitman Primary Care Allis Quirarte: Marisue Ivan Other Clinician: Betha Loa Referring Sariyah Corcino: Treating Coren Sagan/Extender: RO BSO N, MICHA  EL Dixie Dials Weeks in Treatment: 93 Vital Signs Height(in): 66 Pulse(bpm): 68 Weight(lbs): 153 Blood Pressure(mmHg): 147/71 Body Mass Index(BMI): 24.7 Temperature(F): 97.7 Respiratory Rate(breaths/min): 16 [10:Photos:] Left, Posterior Lower Leg Left, Lateral Lower Leg Left, Medial Ankle Wound Location: Gradually Appeared Gradually Appeared Gradually Appeared Wounding Event: ICEIS, EBNER ([id number]) [id number] Page 4 of 12 Venous Leg Ulcer Venous Leg Ulcer Venous Leg Ulcer Primary Etiology: Hypertension, Osteoarthritis, ReceivedHypertension, Osteoarthritis, ReceivedHypertension, Osteoarthritis, Received Comorbid History: Chemotherapy, Received Radiation Chemotherapy, Received Radiation Chemotherapy, Received Radiation 03/04/2023 03/04/2023 03/04/2023 Date Acquired: 5 5 5  Weeks of Treatment: Open Open Open Wound Status: No No No Wound Recurrence: Yes No Yes  Clustered Wound: 3.7x4.7x0.2 1.5x1x0.02 1.9x1x0.2 Measurements L x W x D (cm) 13.658 1.178 1.492 A (cm) : rea 2.732 0.024 0.298 Volume (cm) : 77.10% 11.80% 50.50% % Reduction in Area: 54.20% 82.10% 1.30% % Reduction in Volume: Full Thickness Without Exposed Full Thickness Without Exposed Full Thickness Without Exposed Classification: Support Structures Support Structures Support Structures Medium Small Medium Exudate Amount: Serosanguineous Serosanguineous Serosanguineous Exudate Type: red, brown red, brown red, brown Exudate Color: Distinct, outline attached Distinct, outline attached Distinct, outline attached Wound Margin: Small (1-33%) Large (67-100%) Small (1-33%) Granulation Amount: Red Red Pink Granulation Quality: Large (67-100%) Small (1-33%) Large (67-100%) Necrotic Amount: Fat Layer (Subcutaneous Tissue): Yes Fat Layer (Subcutaneous Tissue): Yes Fat Layer (Subcutaneous Tissue): Yes Exposed Structures: Fascia: No Fascia: No Fascia: No Tendon:  No Tendon: No Tendon: No Muscle: No Muscle: No Muscle: No Joint: No Joint: No Joint: No Bone: No Bone: No Bone: No N/A None None Epithelialization: Wound Number: 13 N/A N/A Photos: N/A N/A Left, Lateral, Posterior Lower Leg N/A N/A Wound Location: Not Known N/A N/A Wounding Event: Venous Leg Ulcer N/A N/A Primary Etiology: Hypertension, Osteoarthritis, ReceivedN/A N/A Comorbid History: Chemotherapy, Received Radiation 02/14/2023 N/A N/A Date Acquired: 2 N/A N/A Weeks of Treatment: Open N/A N/A Wound Status: No N/A N/A Wound Recurrence: No N/A N/A Clustered Wound: 5.3x3x0.2 N/A N/A Measurements L x W x D (cm) 12.488 N/A N/A A (cm) : rea 2.498 N/A N/A Volume (cm) : 33.80% N/A N/A % Reduction in Area: 33.70% N/A N/A % Reduction in Volume: Full Thickness Without Exposed N/A N/A Classification: Support Structures Medium N/A N/A Exudate Amount: Serosanguineous N/A N/A Exudate Type: red, brown N/A N/A Exudate Color: N/A N/A N/A Wound Margin: Medium (34-66%) N/A N/A Granulation Amount: Red N/A N/A Granulation Quality: Small (1-33%) N/A N/A Necrotic Amount: Fat Layer (Subcutaneous Tissue): Yes N/A N/A Exposed Structures: Fascia: No Tendon: No Muscle: No Joint: No Bone: No None N/A N/A Epithelialization: Treatment Notes Electronic Signature(s) Signed: 04/18/2023 4:34:51 PM By: Betha Loa Entered By: Betha Loa on 04/18/2023 10:18:00 Laura Santiago ([id number]) [id number] Page 5 of 12 -------------------------------------------------------------------------------- Multi-Disciplinary Care Plan Details Patient Name: Date of Service: Laura Santiago. 04/18/2023 10:00 A M Medical Record Number: [id number] Patient Account Number: [id number] Date of Birth/Sex: Treating RN: March 17, 1946 (77 y.o. Ginette Pitman Primary Care Rajinder Mesick: Marisue Ivan Other Clinician: Betha Loa Referring Virgen Belland: Treating  Nyna Chilton/Extender: RO BSO N, MICHA EL Burnard Bunting in Treatment: 51 Active Inactive Necrotic Tissue Nursing Diagnoses: Impaired tissue integrity related to necrotic/devitalized tissue Knowledge deficit related to management of necrotic/devitalized tissue Goals: Necrotic/devitalized tissue will be minimized in the wound bed Date Initiated: 03/13/2023 Target Resolution Date: 04/27/2023 Goal Status: Active Patient/caregiver will verbalize understanding of reason and process for debridement of necrotic tissue Date Initiated: 03/13/2023 Target Resolution Date: 04/27/2023 Goal Status: Active Interventions: Assess patient pain level pre-, during and post procedure and prior to discharge Provide education on necrotic tissue and debridement process Treatment Activities: Apply topical anesthetic as ordered : 07/06/2021 Biologic debridement : 07/06/2021 Enzymatic debridement : 07/06/2021 Excisional debridement : 07/06/2021 Notes: Wound/Skin Impairment Nursing Diagnoses: Impaired tissue integrity Goals: Patient/caregiver will verbalize understanding of skin care regimen Date Initiated: 03/13/2023 Target Resolution Date: 04/27/2023 Goal Status: Active Ulcer/skin breakdown will have a volume reduction of 30% by week 4 Date Initiated: 03/13/2023 Target Resolution Date: 04/12/2023 Goal Status: Active Ulcer/skin breakdown will have a volume reduction of 50% by week 8 Date Initiated: 03/13/2023 Target Resolution Date: 03/13/2023 Goal Status:  Active Ulcer/skin breakdown will have a volume reduction of 80% by week 12 Date Initiated: 03/13/2023 Target Resolution Date: 06/12/2023 Goal Status: Active Ulcer/skin breakdown will heal within 14 weeks Date Initiated: 03/13/2023 Target Resolution Date: 06/26/2023 Goal Status: Active Interventions: Assess patient/caregiver ability to obtain necessary supplies Assess patient/caregiver ability to perform ulcer/skin care regimen upon admission and as  needed Assess ulceration(s) every visit Treatment Activities: Referred to DME Jahdai Padovano for dressing supplies : 07/06/2021 Skin care regimen initiated : 07/06/2021 Notes: Electronic Signature(s) Signed: 04/18/2023 4:34:51 PM By: Betha Loa Signed: 04/18/2023 5:05:57 PM By: Midge Aver MSN RN CNS [address], [place] ([id number]) [id number] Page 6 of 12 Entered By: Betha Loa on 04/18/2023 10:33:44 -------------------------------------------------------------------------------- Pain Assessment Details Patient Name: Date of Service: Laura Santiago. 04/18/2023 10:00 A M Medical Record Number: [id number] Patient Account Number: [id number] Date of Birth/Sex: Treating RN: 1946/05/30 (77 y.o. Ginette Pitman Primary Care Jago Carton: Marisue Ivan Other Clinician: Betha Loa Referring Jarreau Callanan: Treating Tamitha Norell/Extender: RO BSO N, MICHA EL Dixie Dials Weeks in Treatment: 81 Active Problems Location of Pain Severity and Description of Pain Patient Has Paino No Site Locations Pain Management and Medication Current Pain Management: Electronic Signature(s) Signed: 04/18/2023 4:34:51 PM By: Betha Loa Signed: 04/18/2023 5:05:57 PM By: Midge Aver MSN RN CNS WTA Entered By: Betha Loa on 04/18/2023 09:59:45 -------------------------------------------------------------------------------- Patient/Caregiver Education Details Patient Name: Date of Service: Laura Muscat [name] 5/8/2024andnbsp10:00 A M Medical Record Number: [id number] Patient Account Number: [id number] Date of Birth/Gender: Treating RN: [birth year] (77 y.o. Ginette Pitman Primary Care Physician: Marisue Ivan Other Clinician: Betha Loa Referring Physician: Treating Physician/Extender: RO BSO N, MICHA EL Burnard Bunting in Treatment: 31 Education Assessment Education Provided To: Patient Education Topics Provided Wound/Skin  Impairment: Handouts: Other: continue wound care as directed Methods: Explain/Verbal Responses: State content correctly Electronic Signature(s) Signed: 04/18/2023 4:34:51 PM By: Unknown Foley ([id number]) [id number] Page 7 of 12 Entered By: Betha Loa on 04/18/2023 10:34:03 -------------------------------------------------------------------------------- Wound Assessment Details Patient Name: Date of Service: Laura Santiago. 04/18/2023 10:00 A M Medical Record Number: [id number] Patient Account Number: [id number] Date of Birth/Sex: Treating RN: 19-Jan-1946 (77 y.o. Ginette Pitman Primary Care [name] Christopherson: Marisue Ivan Other Clinician: Betha Loa Referring Torrez Renfroe: Treating Rilya Longo/Extender: RO BSO N, MICHA EL Dixie Dials Weeks in Treatment: 93 Wound Status Wound Number: 10 Primary Venous Leg Ulcer Etiology: Wound Location: Left, Posterior Lower Leg Wound Status: Open Wounding Event: Gradually Appeared Comorbid Hypertension, Osteoarthritis, Received Chemotherapy, Date Acquired: 03/04/2023 History: Received Radiation Weeks Of Treatment: 5 Clustered Wound: Yes Photos Wound Measurements Length: (cm) 3.7 Width: (cm) 4.7 Depth: (cm) 0.2 Area: (cm) 13.658 Volume: (cm) 2.732 % Reduction in Area: 77.1% % Reduction in Volume: 54.2% Wound Description Classification: Full Thickness Without Exposed Suppor Wound Margin: Distinct, outline attached Exudate Amount: Medium Exudate Type: Serosanguineous Exudate Color: red, brown t Structures Foul Odor After Cleansing: No Slough/Fibrino Yes Wound Bed Granulation Amount: Small (1-33%) Exposed Structure Granulation Quality: Red Fascia Exposed: No Necrotic Amount: Large (67-100%) Fat Layer (Subcutaneous Tissue) Exposed: Yes Tendon Exposed: No Muscle Exposed: No Joint Exposed: No Bone Exposed: No Treatment Notes Wound #10 (Lower Leg) Wound Laterality: Left,  Posterior Cleanser Peri-Wound Care Topical Keystone Primary Dressing Aquacel Extra Hydrofiber Dressing, 4x5 (in/in) Secondary Dressing ABD Pad 5x9 (in/in) Discharge Instruction: Cover with ABD pad Laura Santiago, Laura Santiago ([id number]) [id number] Page 8 of 12 Secured With Compression Wrap 3-LAYER WRAP - Profore Lite LF 3 Multilayer Compression  Bandaging System Discharge Instruction: Apply 3 multi-layer wrap as prescribed. Compression Stockings Add-Ons Electronic Signature(s) Signed: 04/18/2023 4:34:51 PM By: Betha Loa Signed: 04/18/2023 5:05:57 PM By: Midge Aver MSN RN CNS WTA Entered By: Betha Loa on 04/18/2023 10:14:22 -------------------------------------------------------------------------------- Wound Assessment Details Patient Name: Date of Service: Laura Muscat [name] 04/18/2023 10:00 A M Medical Record Number: [id number] Patient Account Number: [id number] Date of Birth/Sex: Treating RN: July 13, 1946 (76 y.o. Ginette Pitman Primary Care Jaylyne Breese: Marisue Ivan Other Clinician: Betha Loa Referring Ripley Bogosian: Treating Adonica Fukushima/Extender: RO BSO N, MICHA EL Dixie Dials Weeks in Treatment: 93 Wound Status Wound Number: 11 Primary Venous Leg Ulcer Etiology: Wound Location: Left, Lateral Lower Leg Wound Status: Open Wounding Event: Gradually Appeared Comorbid Hypertension, Osteoarthritis, Received Chemotherapy, Date Acquired: 03/04/2023 History: Received Radiation Weeks Of Treatment: 5 Clustered Wound: No Photos Wound Measurements Length: (cm) 1.5 Width: (cm) 1 Depth: (cm) 0.02 Area: (cm) 1.178 Volume: (cm) 0.024 % Reduction in Area: 11.8% % Reduction in Volume: 82.1% Epithelialization: None Wound Description Classification: Full Thickness Without Exposed Suppor Wound Margin: Distinct, outline attached Exudate Amount: Small Exudate Type: Serosanguineous Exudate Color: red, brown t Structures Foul Odor After  Cleansing: No Slough/Fibrino Yes Wound Bed Granulation Amount: Large (67-100%) Exposed Structure Granulation Quality: Red Fascia Exposed: No Necrotic Amount: Small (1-33%) Fat Layer (Subcutaneous Tissue) Exposed: Yes Necrotic Quality: Adherent Slough Tendon Exposed: No Muscle Exposed: No Joint Exposed: No Bone Exposed: No Laura Santiago, Laura Santiago ([id number]) [id number] Page 9 of 12 Treatment Notes Wound #11 (Lower Leg) Wound Laterality: Left, Lateral Cleanser Peri-Wound Care Topical Keystone Primary Dressing Aquacel Extra Hydrofiber Dressing, 4x5 (in/in) Secondary Dressing ABD Pad 5x9 (in/in) Discharge Instruction: Cover with ABD pad Secured With Compression Wrap 3-LAYER WRAP - Profore Lite LF 3 Multilayer Compression Bandaging System Discharge Instruction: Apply 3 multi-layer wrap as prescribed. Compression Stockings Add-Ons Electronic Signature(s) Signed: 04/18/2023 4:34:51 PM By: Betha Loa Signed: 04/18/2023 5:05:57 PM By: Midge Aver MSN RN CNS WTA Entered By: Betha Loa on 04/18/2023 10:14:51 -------------------------------------------------------------------------------- Wound Assessment Details Patient Name: Date of Service: Laura Muscat [name] 04/18/2023 10:00 A M Medical Record Number: [id number] Patient Account Number: [id number] Date of Birth/Sex: Treating RN: 07-02-1946 (77 y.o. Ginette Pitman Primary Care Billy Turvey: Marisue Ivan Other Clinician: Betha Loa Referring Kaetlyn Noa: Treating Sita Mangen/Extender: RO BSO N, MICHA EL Dixie Dials Weeks in Treatment: 93 Wound Status Wound Number: 12 Primary Venous Leg Ulcer Etiology: Wound Location: Left, Medial Ankle Wound Status: Open Wounding Event: Gradually Appeared Comorbid Hypertension, Osteoarthritis, Received Chemotherapy, Date Acquired: 03/04/2023 History: Received Radiation Weeks Of Treatment: 5 Clustered Wound: Yes Photos Wound Measurements Length: (cm)  1.9 Width: (cm) 1 Depth: (cm) 0.2 Area: (cm) 1.492 Volume: (cm) 0.298 % Reduction in Area: 50.5% % Reduction in Volume: 1.3% Epithelialization: None Wound Description Classification: Full Thickness Without Exposed Support Structures Wound Margin: Distinct, outline attached Laura Santiago, Laura Santiago ([id number]) Exudate Amount: Medium Exudate Type: Serosanguineous Exudate Color: red, brown Foul Odor After Cleansing: No Slough/Fibrino Yes [id number] Page 10 of 12 Wound Bed Granulation Amount: Small (1-33%) Exposed Structure Granulation Quality: Pink Fascia Exposed: No Necrotic Amount: Large (67-100%) Fat Layer (Subcutaneous Tissue) Exposed: Yes Necrotic Quality: Adherent Slough Tendon Exposed: No Muscle Exposed: No Joint Exposed: No Bone Exposed: No Treatment Notes Wound #12 (Ankle) Wound Laterality: Left, Medial Cleanser Peri-Wound Care Topical Keystone Primary Dressing Aquacel Extra Hydrofiber Dressing, 4x5 (in/in) Secondary Dressing ABD Pad 5x9 (in/in) Discharge Instruction: Cover with ABD pad Secured With Compression Wrap 3-LAYER WRAP - Profore Lite LF  3 Multilayer Compression Bandaging System Discharge Instruction: Apply 3 multi-layer wrap as prescribed. Compression Stockings Add-Ons Electronic Signature(s) Signed: 04/18/2023 4:34:51 PM By: Betha Loa Signed: 04/18/2023 5:05:57 PM By: Midge Aver MSN RN CNS WTA Entered By: Betha Loa on 04/18/2023 10:15:31 -------------------------------------------------------------------------------- Wound Assessment Details Patient Name: Date of Service: Laura Muscat [name] 04/18/2023 10:00 A M Medical Record Number: [id number] Patient Account Number: [id number] Date of Birth/Sex: Treating RN: 1946-11-21 (76 y.o. Ginette Pitman Primary Care Eean Buss: Marisue Ivan Other Clinician: Betha Loa Referring Summerlynn Glauser: Treating Sherian Valenza/Extender: RO BSO N, MICHA EL Dixie Dials Weeks  in Treatment: 93 Wound Status Wound Number: 13 Primary Venous Leg Ulcer Etiology: Wound Location: Left, Lateral, Posterior Lower Leg Wound Status: Open Wounding Event: Not Known Comorbid Hypertension, Osteoarthritis, Received Chemotherapy, Date Acquired: 02/14/2023 History: Received Radiation Weeks Of Treatment: 2 Clustered Wound: No Photos Laura Santiago, Laura Santiago ([id number]) [id number] Page 11 of 12 Wound Measurements Length: (cm) 5.3 Width: (cm) 3 Depth: (cm) 0.2 Area: (cm) 12.488 Volume: (cm) 2.498 % Reduction in Area: 33.8% % Reduction in Volume: 33.7% Epithelialization: None Wound Description Classification: Full Thickness Without Exposed Suppor Exudate Amount: Medium Exudate Type: Serosanguineous Exudate Color: red, brown t Structures Foul Odor After Cleansing: No Slough/Fibrino Yes Wound Bed Granulation Amount: Medium (34-66%) Exposed Structure Granulation Quality: Red Fascia Exposed: No Necrotic Amount: Small (1-33%) Fat Layer (Subcutaneous Tissue) Exposed: Yes Necrotic Quality: Adherent Slough Tendon Exposed: No Muscle Exposed: No Joint Exposed: No Bone Exposed: No Treatment Notes Wound #13 (Lower Leg) Wound Laterality: Left, Lateral, Posterior Cleanser Peri-Wound Care Topical Primary Dressing Secondary Dressing Secured With Compression Wrap Compression Stockings Add-Ons Electronic Signature(s) Signed: 04/18/2023 4:34:51 PM By: Betha Loa Signed: 04/18/2023 5:05:57 PM By: Midge Aver MSN RN CNS WTA Entered By: Betha Loa on 04/18/2023 10:16:06 -------------------------------------------------------------------------------- Vitals Details Patient Name: Date of Service: Laura Santiago, BO [name] 04/18/2023 10:00 A M Medical Record Number: [id number] Patient Account Number: [id number] Date of Birth/Sex: Treating RN: [birth year] (77 y.o. Ginette Pitman Primary Care Larra Crunkleton: Marisue Ivan Other Clinician: Betha Loa Referring Lallie Strahm: Treating Kahlil Cowans/Extender: RO BSO N, MICHA EL Dixie Dials Weeks in Treatment: 93 Vital Signs Time Taken: 09:56 Temperature (F): 97.7 Laura Santiago, Laura M ([id number]) [id number] Page 12 of 12 Height (in): 66 Pulse (bpm): 68 Weight (lbs): 153 Respiratory Rate (breaths/min): 16 Body Mass Index (BMI): 24.7 Blood Pressure (mmHg): 147/71 Reference Range: 80 - 120 mg / dl Electronic Signature(s) Signed: 04/18/2023 4:34:51 PM By: Betha Loa Entered By: Betha Loa on 04/18/2023 09:59:42

## 2023-04-19 NOTE — Progress Notes (Signed)
NILLIE, OTA ([id number]) [id number] Page 1 of 14 Visit Report for 04/18/2023 Debridement Details Patient Name: Date of Service: Laura Santiago. 04/18/2023 10:00 A M Medical Record Number: [id number] Patient Account Number: [id number] Date of Birth/Sex: Treating RN: 04/24/1946 (77 y.o. Ginette Pitman Primary Care Provider: Marisue Ivan Other Clinician: Betha Loa Referring Provider: Treating Provider/Extender: RO BSO N, MICHA EL Burnard Bunting in Treatment: 32 Debridement Performed for Assessment: Wound #10 Left,Posterior Lower Leg Performed By: Physician Maxwell Caul, MD Debridement Type: Debridement Severity of Tissue Pre Debridement: Fat layer exposed Level of Consciousness (Pre-procedure): Awake and Alert Pre-procedure Verification/Time Out Yes - 10:30 Taken: Start Time: 10:30 Percent of Wound Bed Debrided: 100% T Area Debrided (cm): otal 13.65 Tissue and other material debrided: Viable, Non-Viable, Eschar, Slough, Slough Level: Non-Viable Tissue Debridement Description: Selective/Open Wound Instrument: Curette Bleeding: Minimum Hemostasis Achieved: Pressure Response to Treatment: Procedure was tolerated well Level of Consciousness (Post- Awake and Alert procedure): Post Debridement Measurements of Total Wound Length: (cm) 3.7 Width: (cm) 4.7 Depth: (cm) 0.2 Volume: (cm) 2.732 Character of Wound/Ulcer Post Debridement: Stable Severity of Tissue Post Debridement: Fat layer exposed Post Procedure Diagnosis Same as Pre-procedure Electronic Signature(s) Signed: 04/18/2023 4:34:51 PM By: Betha Loa Signed: 04/18/2023 4:41:35 PM By: Baltazar Najjar MD Signed: 04/18/2023 5:05:57 PM By: Midge Aver MSN RN CNS WTA Entered By: Betha Loa on 04/18/2023 10:31:06 -------------------------------------------------------------------------------- Debridement Details Patient Name: Date of Service: Laura Muscat NNIE  M. 04/18/2023 10:00 A M Medical Record Number: [id number] Patient Account Number: [id number] Date of Birth/Sex: Treating RN: [birth year] (77 y.o. Ginette Pitman Primary Care Provider: Marisue Ivan Other Clinician: Betha Loa Referring Provider: Treating Provider/Extender: RO BSO N, MICHA EL Burnard Bunting in Treatment: 93 Debridement Performed for Assessment: Wound #13 Left,Lateral,Posterior Lower Leg Performed By: Physician Maxwell Caul, MD Debridement Type: Debridement Severity of Tissue Pre Debridement: Fat layer exposed Level of Consciousness (Pre-procedure): Awake and Alert Pre-procedure Verification/Time Out Yes - 10:31 Taken: Start Time: 10:31 Percent of Wound Bed Debrided: 100% T Area Debrided (cm): otal 12.48 Tissue and other material debrided: Viable, Non-Viable, Eschar, Slough, Slough Level: Non-Viable Tissue Debridement Description: Selective/Open Wound Instrument: CHANEKA, SANDMAN ([id number]) [id number] Page 2 of 14 Bleeding: Minimum Hemostasis Achieved: Pressure Response to Treatment: Procedure was tolerated well Level of Consciousness (Post- Awake and Alert procedure): Post Debridement Measurements of Total Wound Length: (cm) 5.3 Width: (cm) 3 Depth: (cm) 0.2 Volume: (cm) 2.498 Character of Wound/Ulcer Post Debridement: Stable Severity of Tissue Post Debridement: Fat layer exposed Post Procedure Diagnosis Same as Pre-procedure Electronic Signature(s) Signed: 04/18/2023 4:34:51 PM By: Betha Loa Signed: 04/18/2023 4:41:35 PM By: Baltazar Najjar MD Signed: 04/18/2023 5:05:57 PM By: Midge Aver MSN RN CNS WTA Entered By: Betha Loa on 04/18/2023 10:31:56 -------------------------------------------------------------------------------- Debridement Details Patient Name: Date of Service: Laura Muscat [name] 04/18/2023 10:00 A M Medical Record Number: [id number] Patient Account Number:  [id number] Date of Birth/Sex: Treating RN: 1946/06/12 (77 y.o. Ginette Pitman Primary Care Provider: Marisue Ivan Other Clinician: Betha Loa Referring Provider: Treating Provider/Extender: RO BSO N, MICHA EL Burnard Bunting in Treatment: 93 Debridement Performed for Assessment: Wound #11 Left,Lateral Lower Leg Performed By: Physician Maxwell Caul, MD Debridement Type: Debridement Severity of Tissue Pre Debridement: Fat layer exposed Level of Consciousness (Pre-procedure): Awake and Alert Pre-procedure Verification/Time Out Yes - 10:32 Taken: Start Time: 10:32 Percent of Wound Bed Debrided: 100% T Area Debrided (cm): otal 1.18 Tissue and  other material debrided: Viable, Non-Viable, Slough, Slough Level: Non-Viable Tissue Debridement Description: Selective/Open Wound Instrument: Curette Bleeding: Minimum Hemostasis Achieved: Pressure Response to Treatment: Procedure was tolerated well Level of Consciousness (Post- Awake and Alert procedure): Post Debridement Measurements of Total Wound Length: (cm) 1.5 Width: (cm) 1 Depth: (cm) 0.2 Volume: (cm) 0.236 Character of Wound/Ulcer Post Debridement: Stable Severity of Tissue Post Debridement: Fat layer exposed Post Procedure Diagnosis Same as Pre-procedure Electronic Signature(s) Signed: 04/18/2023 4:34:51 PM By: Betha Loa Signed: 04/18/2023 4:41:35 PM By: Baltazar Najjar MD Signed: 04/18/2023 5:05:57 PM By: Midge Aver MSN RN CNS WTA Entered By: Betha Loa on 04/18/2023 10:32:52 [name] ([id number]) [id number] Page 3 of 14 -------------------------------------------------------------------------------- HPI Details Patient Name: Date of Service: Laura Santiago. 04/18/2023 10:00 A M Medical Record Number: [id number] Patient Account Number: [id number] Date of Birth/Sex: Treating RN: 03-06-1946 (77 y.o. Ginette Pitman Primary Care Provider: Marisue Ivan Other Clinician: Betha Loa Referring Provider: Treating Provider/Extender: RO BSO N, MICHA EL Dixie Dials Weeks in Treatment: 40 History of Present Illness HPI Description: Admission 7/27 Ms. Laura Santiago is a 77 year old female with a past medical history of ADHD, metastatic breast cancer, stage IV chronic kidney disease, history of DVT on Xarelto and chronic venous insufficiency that presents to the clinic for a chronic left lower extremity wound. She recently moved to [facility] 4 days ago. She was being followed by wound care center in [place]. She reports a 10-year history of wounds to her left lower extremity that eventually do heal with debridement and compression therapy. She states that the current wound reopened 4 months ago and she is using Vaseline and Coban. She denies signs of infection. 8/3; patient presents for 1 week follow-up. She reports no issues or complaints today. She states she had vascular studies done in the last week. She denies signs of infection. She brought her little service dog with her today. 8/17; patient presents for follow-up. She has missed her last clinic appointment. She states she took the wrap off and attempted to rewrap her leg. She is having difficulty with transportation. She has her service dog with her today. Overall she feels well and reports improvement in wound healing. She denies signs of infection. She reports owning an old Velcro wrap compression and has this at her living facility 9/14; patient presents for follow-up. Patient states that over the past 2 to 3 weeks she developed toe wounds to her right foot. She attributes this to tight fitting shoes. She subsequently developed cellulitis in the right leg and has been treated by doxycycline by her oncologist. She reports improvement in symptoms however continues to have some redness and swelling to this leg. T the left lower extremity patient has been having  her wraps changed with home health twice weekly. She states that the [facility] is not helping control o the drainage. Other than that she has no issues or complaints today. She denies signs of infection to the left lower extremity. 9/21; patient presents for follow-up. She reports seeing infectious disease for her cellulitis. She reports no further management. She has home health that changes the wraps twice weekly. She has no issues or complaints today. She denies signs of infection. 10/5; patient presents for follow-up. She has no issues or complaints today. She denies signs of infection. She states that the right great toe has not been dressed by home health. 10/12; patient presents for follow-up. She has no issues or complaints today. She  reports improvement in her wound healing. She has been using silver alginate to the right great toe wound. She denies signs of infection. 10/26; patient presents for follow-up. Home health did not have sorbact so they continued to use Hydrofera Blue under the wrap. She has been using silver alginate to the great toe wound however she did not have a dressing in place today. She currently denies signs of infection. 11/2; patient presents for follow-up. She has been using sorb act under the compression wrap. She reports using silver alginate to the toe wound again she does not have a dressing in place. She currently denies signs of infection. 11/23; patient presents for follow-up. Unfortunately she has missed her last 2 clinic appointments. She was last seen 3 weeks ago. She did her own compression wrap with Kerlix and Coban yesterday after seeing vein and vascular. She has not been dressing her right great toe wound. She currently denies signs of infection. 11/30; patient presents for 1 week follow-up. She states she changed her dressing last week prior to home health and use sorb act with Dakin's and Hydrofera Blue. Home health has changed the dressing as  well and they have been using sorbact. T oday she reports increased redness to her right lower extremity. She has a history of cellulitis to this leg. She has been using silver alginate to the right great toe. Unfortunately she had an episode of diarrhea prior to coming in and had feces all over the right leg and to the wrap of her left leg. 12/7; patient presents for 1 week follow-up. She states that home health did not come out to change the dressing and she took it off yesterday. It is unclear if she is dressing the right toe wound. She denies signs of infection. 12/14; patient presents for 1 week follow-up. She has no issues or complaints today. 12/21; patient presents for follow-up. She has no issues or complaints today. She denies signs of infection. 12/28/2021; patient presents for follow-up. She was hospitalized for sepsis secondary to right lower extremity cellulitis On 12/23. She states she is currently at a SNF. She states that she was started on doxycycline this morning for her right great toe swelling and redness. She is not sure what dressings have been done to her left lower extremity for the past 3 weeks. She says its been mainly gauze with an Ace wrap. 1/25; patient presents for follow-up. She is still residing in a skilled nursing facility. She reports mild pain to the left lower extremity wound bed. She states she is going to see a podiatrist soon. 2/8; patient presents for follow-up. She has moved back to her residential community from her skilled nursing facility. She has no issues or complaints today. She denies signs of systemic infections. 2/15; patient presents for follow-up. He has no issues or complaints today. She denies systemic signs of infection. 2/22; patient presents for follow-up. She has no issues or complaints today. She denies signs of infection. 3/1; patient presents for follow-up. She states that home health came out the day after she was seen in our clinic and  yesterday to do the wrap change. She denies signs of infection. She reports excoriated skin on the ankle. 3/8; patient presents for follow-up. She has no issues or complaints today. She denies signs of infection. 3/15; patient presents for follow-up. Home health has been coming out to change the dressings. She reports more tenderness to the wound site. She denies purulent drainage, increased warmth or erythema  to the area. 4/5; patient presents for follow-up. She has missed her last 2 clinic appointments. I have not seen her in 3 weeks. She was recently hospitalized for altered mental status. She was involuntarily committed. She was evaluated by psychiatry and deemed to have competency. There was no specific cause of her altered mental status. It was concluded that her physical and mental health were declining due to her chronic medical conditions. Currently home health has been coming out for dressing changes. Patient has also been doing her own dressing changes. She reports more skin breakdown to the periwound and now has YEIRA, HOFFNER ([id number]) [id number] Page 4 of 14 a new wound. She denies fever/chills. She reports continued tenderness to the wound site. 4/12; patient with significant venous insufficiency and a large wound on her left lower leg taking up about 80% of the circumference of her lower leg. Cultures of this grew MRSA and Pseudomonas. She had completed a course of ciprofloxacin now is starting doxycycline. She has been using Dakin's wet-to-dry and a Tubigrip. She has home health twice a week and we change it once. 4/19; patient presents for follow-up. She completed her course of doxycycline. She has been using Dakin's wet-to-dry dressing and Tubigrip. Home health changes the dressing twice weekly. Currently she has no issues or complaints. 4/26; patient presents for follow-up. At last clinic visit orders for home health were Iodosorb under  compression therapy. Unfortunately they did not have the dressing and have been using Dakin's and gentamicin under the wrap. Patient currently denies signs of infection. She has no issues or complaints today. 5/3; patient presents for follow-up. Again Iodosorb has not been used under the compression therapy when home health comes out to change the wrap and dressing. They have been using Sorbact. It is unclear why this is happening since we send orders weekly to the agency. She denies signs of infection. Patient has not purchased the [place] antibiotics. We reached out to the company and they said they have been trying to contact her on a regular basis. We gave the patient the number to call to order the medication. 5/10; patient presents for follow-up. She has no issues or complaints today. Again home health has not been using Iodosorb. Mepilex was on the wound bed. No other dressings noted. She brought in her Keystone antibiotics. She denies signs of infection. 5/17; patient presents for follow-up. Home health has come out twice since she was last seen. Joint well she has been using Keystone antibiotic with Sorbact under the compression wrap. She has no issues or complaints today. She denies signs of infection. 5/24; patient presents for follow-up. We have been using Keystone antibiotics with Sorbact under compression therapy. She is tolerating the treatment well. She is reporting improvement in wound healing. She denies signs of infection. 5/31; patient presents for follow-up. We continue to do [facility] antibiotics with Sorbact under compression therapy. She continues to report improvement in wound healing. Home health comes out and changes the dressing once weekly. 05-17-2022 upon evaluation today patient appears to be doing better in regard to her wound especially compared to the last time I saw her. Fortunately I do think that she is seeing improvements. With that being said I do believe that she  may be benefit from sharp debridement today to clear away some of the necrotic debris I discussed that with her as well. She is an amendable to that plan. Otherwise she is very pleased with how the [facility] is doing for her.  6/14; patient presents for follow-up. We have been using Keystone antibiotic with Sorbact and absorbent dressings under 3 layer compression. She has no issues or complaints today. She reports improvement in wound healing. She denies signs of infection. 6/21; patient presents for follow-up. We are continuing with [facility] antibiotic and Sorbact under 3 layer compression. Patient has no complaints. Continued wound healing is happening. She denies signs of infection. 6/28; patient presents for follow-up. We have been using Keystone antibiotic with Sorbact under 3 layer compression. Usually home health comes out and changes the dressing twice a week. Unfortunately they did not go out to change the dressing. It is unclear why. Patient did not call them. She currently denies signs of infection. 7/5; patient presents for follow-up. We have been using Keystone antibiotic with calcium alginate under 3 layer compression. She reports improvement in wound healing. She denies signs of infection. Home health has come out to do dressing changes twice this past week. 7/12; patient presents for follow-up. We have been using Keystone antibiotic with calcium alginate under 3 layer compression. Patient states that home health came out once last week to change the dressing. She reports improvement in wound healing. She currently denies signs of infection. 7/19; patient presents for follow-up. We have been using Keystone antibiotic with calcium alginate under 3 layer compression. Home health came out once last week to change the dressing. She has no issues or complaints today. She denies signs of infection. 8/2; patient presents for follow-up. We have been using Keystone antibiotic with calcium  alginate under 3 layer compression. Unfortunately she missed her appointment last week and home health did not come out to do dressing changes. Patient currently denies signs of infection. 8/9; patient presents for follow-up. We have been using Keystone with calcium alginate under 3 layer compression. She states that home health came out once last week. She currently denies signs of infection. Her wrap was completely wet. She states she was cleaning the top of the leg and water soaked down into the wrap. 8/16; patient presents for follow-up. We have been using Keystone with calcium alginate under 3 layer compression. She states that home health came out twice last week. She has no issues or complaints today. 8/23; patient presents for follow-up. He has been using Keystone with calcium alginate under 3 layer compression. Home health came out twice last week. She denies signs of infection. 8/30; patient presents for follow-up. We have been using Keystone with calcium alginate under 3 layer compression. Home health came out once last week to change the dressing. Patient reports improvement in wound healing. She states she is almost done with her chemotherapy infusions and has 1 more left. 9/13; patient presents for follow-up. She has lost the capsules to her [facility] antibiotic which I believe is the vancomycin pills. She has her Zosyn powder today. We have been using Keystone antibiotic ointment with calcium alginate under 3 layer compression. She is concerned about systemic infection however her vitals are stable and there is no surrounding soft tissue infection. She would like to remain a patient in our wound care center however would like a second opinion for her wound care at another facility. She asked to be referred to [facility] wound care center. 9/20; patient presents for follow-up. She found her vancomycin capsules and brought in her complete Keystone antibiotic ointment set today.  Unfortunately she has developed skin breakdown and Erythema to the right lower extremity With increased swelling. She states she went to a [employer]  Over the weekend and was on her feet for extended periods of time. She saw her oncologist yesterday who prescribed her doxycycline for her right lower extremity erythema. 9/27; patient presents for follow-up. We have been using Keystone antibiotic with Aquacel under 3 layer compression to the lower extremities bilaterally. When home health came and changed the wrap she secretly put coffee into the spray mix along with [facility] antibiotic on her leg thinking the acidic component would better activate the zoysn (sonething she discussed with her microbiologist brother). She has reported improvement in wound healing. 10/4; patient presents for follow-up. She has no issues or complaints today. We have been doing Aquacel and keystone under 3 layer compression to the lower extremities bilaterally. This morning she took the right lower extremity wrap off as it was uncomfortable. She has no open wounds to this leg. 10/11; patient presents for follow-up. We have been doing Aquacel with Keystone antibiotic ointment under 3 layer compression to the left lower extremity. She developed a small blister to the anterior aspect of the left leg noticed when the wrap was taken off on intake. She currently denies signs of infection. 10/18; patient presents for follow-up. We have been doing Aquacel with Keystone antibiotic ointment under 3 layer compression to the left lower extremity. There has been continued improvement in wound healing. She denies signs of infection. 10/25; patient arrives for treatment of venous insufficiency ulcers on her left lower leg both lateral and medial are remanence of apparently a circumferential wound. Much improved. We are using topicals Keystone and Aquacel Ag under 3 layer compression we continue to make good progress. AMEENAH, SINGELTON  ([id number]) [id number] Page 5 of 14 The patient talk to me at some length with regards to different things she has on her forehead and her Peri orbital area for which she is apparently applying [place]. She feels that what ever we are treating on her wounds is a more systemic problem. I really was not able to get a handle on what she is talking about however I did caution her not to put the [place] in her eyes. 11/1; her wounds continue to improve she is using Keystone and Aquacel Ag G under 3 layer compression. Our intake nurse notes erythema and edema in the right leg. The patient has a litany of concerns with regards to a rash on her forehead or ears and other systemic complaints. She has an appointment with dermatology on November 11 11/8; patient presents for follow-up. We have been using Keystone and Aquacel under 4-layer compression. She has no issues or complaints today. She reports improvement in wound healing. 11/15; patient presents for follow-up. We have been using Aquacel with Keystone antibiotic under 3 layer compression. Patient continues improvement in wound healing. 12/6; patient presents for follow-up. We have been using Aquacel with Keystone antibiotic ointment under 3 layer compression. Wounds appear well-healing. 12/13; patient presents for follow-up. We have been using Aquacel with Keystone antibiotic under 3 layer compression. She has no issues or complaints today. 12/27 left lateral medial ankle. Superficial wounds remain there is significantly improved we are using Keystone backed with Zetuvit under 4-layer compression 1/3; patient presents for follow-up. Her wounds appear well-healing. We have been using Aquacel Ag with Keystone antibiotic ointment under compression therapy. This should be a 3 layer compression. 1/17; patient presents for follow-up. Her wounds on her left lower extremity are well-healing. We are using Aquacel Ag and Keystone  antibiotic ointment under compression therapy. She missed her last clinic  appointment. She states that the wrap has been changed twice weekly since she was last seen. Unfortunately she has developed increased warmth and redness to the right lower extremity consistent with cellulitis. She states this started a few days ago. 1/24; patient presents for follow-up. Her wounds on the left lower extremity are well-healing with Aquacel and Keystone antibiotic ointment under compression therapy. I prescribed Keflex at last clinic visit for cellulitis of the right lower extremity. Unfortunately this has not resolved. She did not follow-up with her PCP for contact our office about her symptoms. 1/31; patient presents for follow-up. We have been using Aquacel and Keystone antibiotic ointment under compression therapy to the left lower extremity. Wounds appear well-healing. She has been taking clindamycin for the past week. Her symptoms have improved slightly with a decrease in erythema and warmth to the right lower extremity. She says her pain level has improved. However Symptoms have not completely resolved. She denies fever/chills, nausea/vomiting. 2/7; patient presents for follow-up. We have been using Aquacel Ag with Keystone antibiotic ointment under compression therapy to the left lower extremity. For the past week home health has not been using Keystone antibiotic ointment. She continues to take clindamycin. Her symptoms have improved greatly with the decrease in erythema and warmth to the right lower extremity. She denies systemic signs of infection. She has an area of skin breakdown to the right anterior leg. 2/14; Patient presents for follow-up. She has been using Hydrofera Blue to the right anterior leg under Tubigrip. It looks like Hydrofera Blue is also being used with Keystone to the left lower extremity under compression therapy. Order is for Aqualcell Ag. Overall wounds appear well healing. She  has no issues or complaints. 2/21; patient presents for follow-up. We have been using Hydrofera Blue under 3 layer compression to the lower extremities bilaterally. Her right lower extremity wounds have healed. She has a small open wound remaining to her left lateral leg. 2/28; patient presents for follow-up. We have been using Hydrofera Blue under 3 layer compression. Home health has changed the dressing to the left lower extremity however started the wrap at the ankle. 3/6; patient presents for follow-up. We have been using PolyMem with antibiotic ointment under 3 layer compression to the left lower extremity. She has no issues or complaints today. 3/12; patient presents for follow-up. We have been using Aquacel Ag under 3 layer compression to the left lower extremity. Her wound is healed. She has juxta lite compression wraps at home. Readmission: 03-13-2023 upon evaluation today patient appears to be doing poorly in regard to her lower extremities. Specifically it is the left lower extremity at this point that is causing her problems she has multiple wounds open. With that being said she does not sound like she has been wearing her compression stockings on a regular basis she has previously seen Dr. Mikey Bussing. With that being said upon evaluation today she tells me that since the wounds have reopened that she has been having a lot of discomfort she also tells me that she went back to using some of the dressings that she had leftover at home she does not know exactly what everything was. Nonetheless she also tells me that she has made an appointment with a dermatologist in town and has to see them tomorrow therefore she is not going to allow [place] to wrap her legs today since "that did not work anyway". With that being said obviously she did improve last time we got her wound healed we  order her juxta lite compression wraps again it does not sound like she has been using those appropriately at  home. 4/10; patient presents for follow-up. Unfortunately she reopened last week and was seen by [facility]. At that time the patient declined in office wraps. She states she is wearing her juxta lite compression wraps however she did not have these on today. 4/17; patient presents for follow-up. At last clinic visit a PCR culture was done that grew Staph aureus and Enterobacter cola CA and Enterococcus bacillus. Keystone antibiotic ointment was ordered. She has not received this yet. We have been using antibiotic ointment with Hydrofera Blue under compression therapy. There is been improvement in her wound healing. She has no issues or complaints today. 4/24; patient presents for follow-up. She received Keystone antibiotic ointment and this was started 3 days ago when home health came to change the wrap. She has no issues or complaints today. 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 She has been using Keystone Aquacel Ag under compression. She tells me she is recently started on chemotherapy for metastatic breast cancer with "bone mets" I am not exactly sure at this point what drugs she is receiving. Electronic Signature(s) Signed: 04/18/2023 4:41:35 PM By: Baltazar Najjar MD Entered By: Baltazar Najjar on 04/18/2023 10:40:20 [name] ([id number]) [id number] Page 6 of 14 -------------------------------------------------------------------------------- Physical Exam Details Patient Name: Date of Service: Laura Santiago. 04/18/2023 10:00 A M Medical Record Number: [id number] Patient Account Number: [id number] Date of Birth/Sex: Treating RN: [birth year] (77 y.o. Ginette Pitman Primary Care Provider: Marisue Ivan Other Clinician: Betha Loa Referring Provider: Treating Provider/Extender: RO BSO N, MICHA EL  Dixie Dials Weeks in Treatment: 93 Constitutional Sitting or standing Blood Pressure is within target range for patient.. Pulse regular and within target range for patient.Marland Kitchen Respirations regular, non-labored and within target range.. Temperature is normal and within the target range for the patient.Marland Kitchen appears in no distress. Notes Wound exam; left lower leg. She presented with a blister in the left anterior upper leg this is unroofed and actually looks clean. She has 2 larger areas on the left posterior calf and 1 anteriorly all of these covered in very adherent slough. I used an open curette to remove as much of this as I could there was minimal bleeding. Her edema is fairly well-controlled there is no evidence of infection Electronic Signature(s) Signed: 04/18/2023 4:41:35 PM By: Baltazar Najjar MD Entered By: Baltazar Najjar on 04/18/2023 10:41:54 -------------------------------------------------------------------------------- Physician Orders Details Patient Name: Date of Service: Laura Muscat [name] 04/18/2023 10:00 A M Medical Record Number: [id number] Patient Account Number: [id number] Date of Birth/Sex: Treating RN: January 04, 1946 (77 y.o. Ginette Pitman Primary Care Provider: Marisue Ivan Other Clinician: Betha Loa Referring Provider: Treating Provider/Extender: RO BSO N, MICHA EL Burnard Bunting in Treatment: 31 Verbal / Phone Orders: Yes Clinician: Midge Aver Read Back and Verified: Yes Diagnosis Coding Follow-up Appointments Return Appointment in 1 week. Nurse Visit as needed Home Health Home Health Company: [facility] [phone] **Please direct any NON-WOUND related issues/requests for orders to patient's Primary Care Physician. **If current dressing causes regression in wound condition, may D/C ordered dressing product/s and apply Normal Saline Moist Dressing daily until next Wound Healing Center or Other MD appointment. **Notify Wound  Healing Center of regression in wound condition at [phone]. Bathing/ [employer] forces technical officer  May shower with wound dressing protected with water repellent cover or cast protector. Edema Control - Lymphedema / Segmental Compressive Device / Other Optional: One layer of unna paste to top of compression wrap (to act as an anchor). Elevate, Exercise Daily and A void Standing for Long Periods of Time. Elevate legs to the level of the heart and pump ankles as often as possible Elevate leg(s) parallel to the floor when sitting. DO YOUR BEST to sleep in the bed at night. DO NOT sleep in your recliner. Long hours of sitting in a recliner leads to swelling of the legs and/or potential wounds on your backside. Additional Orders / Instructions Follow Nutritious Diet and Increase Protein Intake Wound Treatment Wound #10 - Lower Leg Wound Laterality: Left, Posterior Topical: Keystone 3 x Per Week/30 Days Prim Dressing: Aquacel Extra Hydrofiber Dressing, 4x5 (in/in) ary 3 x Per Week/30 Days Secondary Dressing: ABD Pad 5x9 (in/in) 3 x Per Week/30 Days Discharge Instructions: Cover with ABD pad Compression Wrap: 3-LAYER WRAP - Profore Lite LF 3 Multilayer Compression Bandaging System 3 x Per Week/30 Days Discharge Instructions: Apply 3 multi-layer wrap as prescribed. Wound #11 - Lower Leg Wound Laterality: Left, Lateral Topical: Keystone 3 x Per Week/30 Days MERCER, BENTE ([id number]) [id number] Page 7 of 14 Prim Dressing: Aquacel Extra Hydrofiber Dressing, 4x5 (in/in) ary 3 x Per Week/30 Days Secondary Dressing: ABD Pad 5x9 (in/in) 3 x Per Week/30 Days Discharge Instructions: Cover with ABD pad Compression Wrap: 3-LAYER WRAP - Profore Lite LF 3 Multilayer Compression Bandaging System 3 x Per Week/30 Days Discharge Instructions: Apply 3 multi-layer wrap as prescribed. Wound #12 - Ankle Wound Laterality: Left, Medial Topical: Keystone 3 x Per Week/30 Days Prim Dressing:  Aquacel Extra Hydrofiber Dressing, 4x5 (in/in) ary 3 x Per Week/30 Days Secondary Dressing: ABD Pad 5x9 (in/in) 3 x Per Week/30 Days Discharge Instructions: Cover with ABD pad Compression Wrap: 3-LAYER WRAP - Profore Lite LF 3 Multilayer Compression Bandaging System 3 x Per Week/30 Days Discharge Instructions: Apply 3 multi-layer wrap as prescribed. Electronic Signature(s) Signed: 04/18/2023 4:34:51 PM By: Betha Loa Signed: 04/18/2023 4:41:35 PM By: Baltazar Najjar MD Entered By: Betha Loa on 04/18/2023 10:33:21 -------------------------------------------------------------------------------- Problem List Details Patient Name: Date of Service: Laura Muscat [name] 04/18/2023 10:00 A M Medical Record Number: [id number] Patient Account Number: [id number] Date of Birth/Sex: Treating RN: 10-09-1946 (77 y.o. Ginette Pitman Primary Care Provider: Marisue Ivan Other Clinician: Betha Loa Referring Provider: Treating Provider/Extender: RO BSO N, MICHA EL Burnard Bunting in Treatment: 92 Active Problems ICD-10 Encounter Code Description Active Date MDM Diagnosis 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sed11/23/2022 No Yes 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 11/02/2021 No Yes I87.311 Chronic venous hypertension (idiopathic) with ulcer of right lower extremity 08/30/2022 No Yes I87.2 Venous insufficiency (chronic) (peripheral) 07/06/2021 No Yes Z79.01 Long term (current) use of anticoagulants 07/06/2021 No Yes I10 Essential (primary) hypertension 07/06/2021 No Yes 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 07/06/2021 No Yes L03.115 Cellulitis of right lower limb 12/27/2022 No Yes EZELL, AYER ([id number]) [phone].pdf Page 8 of 14 S81.801A Unspecified open wound, right lower leg, initial encounter 01/17/2023 No Yes Inactive Problems ICD-10 Code  Description Active Date Inactive Date S81.802A Unspecified open wound, left lower leg, initial encounter 07/06/2021 07/06/2021 S91.101A Unspecified open wound of right great toe without damage to nail, initial encounter 08/24/2021 08/24/2021 S91.104A Unspecified open wound of right lesser toe(s)  without damage to nail, initial encounter 08/24/2021 08/24/2021 Resolved Problems ICD-10 Code Description Active Date Resolved Date S91.104D Unspecified open wound of right lesser toe(s) without damage to nail, subsequent 08/31/2021 08/31/2021 encounter S91.201D Unspecified open wound of right great toe with damage to nail, subsequent encounter 08/31/2021 08/31/2021 Electronic Signature(s) Signed: 04/18/2023 4:41:35 PM By: Baltazar Najjar MD Entered By: Baltazar Najjar on 04/18/2023 10:34:32 -------------------------------------------------------------------------------- Progress Note Details Patient Name: Date of Service: Laura Muscat [name] 04/18/2023 10:00 A M Medical Record Number: [id number] Patient Account Number: [id number] Date of Birth/Sex: Treating RN: [birth year] (77 y.o. Ginette Pitman Primary Care Provider: Marisue Ivan Other Clinician: Betha Loa Referring Provider: Treating Provider/Extender: RO BSO N, MICHA EL Dixie Dials Weeks in Treatment: 93 Subjective History of Present Illness (HPI) Admission 7/27 Ms. Nakoma Brownback is a 77 year old female with a past medical history of ADHD, metastatic breast cancer, stage IV chronic kidney disease, history of DVT on Xarelto and chronic venous insufficiency that presents to the clinic for a chronic left lower extremity wound. She recently moved to [facility] 4 days ago. She was being followed by wound care center in [place]. She reports a 10-year history of wounds to her left lower extremity that eventually do heal with debridement and compression therapy. She states that the current wound reopened 4 months ago and she is  using Vaseline and Coban. She denies signs of infection. 8/3; patient presents for 1 week follow-up. She reports no issues or complaints today. She states she had vascular studies done in the last week. She denies signs of infection. She brought her little service dog with her today. 8/17; patient presents for follow-up. She has missed her last clinic appointment. She states she took the wrap off and attempted to rewrap her leg. She is having difficulty with transportation. She has her service dog with her today. Overall she feels well and reports improvement in wound healing. She denies signs of infection. She reports owning an old Velcro wrap compression and has this at her living facility 9/14; patient presents for follow-up. Patient states that over the past 2 to 3 weeks she developed toe wounds to her right foot. She attributes this to tight fitting shoes. She subsequently developed cellulitis in the right leg and has been treated by doxycycline by her oncologist. She reports improvement in symptoms however continues to have some redness and swelling to this leg. T the left lower extremity patient has been having her wraps changed with home health twice weekly. She states that the [facility] is not helping control o the drainage. Other than that she has no issues or complaints today. She denies signs of infection to the left lower extremity. 9/21; patient presents for follow-up. She reports seeing infectious disease for her cellulitis. She reports no further management. She has home health that changes the wraps twice weekly. She has no issues or complaints today. She denies signs of infection. 10/5; patient presents for follow-up. She has no issues or complaints today. She denies signs of infection. She states that the right great toe has not been dressed by home health. 10/12; patient presents for follow-up. She has no issues or complaints today. She reports improvement in her wound  healing. She has been using silver alginate CARAH, ARNWINE ([id number]) [id number] Page 9 of 14 to the right great toe wound. She denies signs of infection. 10/26; patient presents for follow-up. Home health did not have sorbact so they continued to use Hydrofera Blue under  the wrap. She has been using silver alginate to the great toe wound however she did not have a dressing in place today. She currently denies signs of infection. 11/2; patient presents for follow-up. She has been using sorb act under the compression wrap. She reports using silver alginate to the toe wound again she does not have a dressing in place. She currently denies signs of infection. 11/23; patient presents for follow-up. Unfortunately she has missed her last 2 clinic appointments. She was last seen 3 weeks ago. She did her own compression wrap with Kerlix and Coban yesterday after seeing vein and vascular. She has not been dressing her right great toe wound. She currently denies signs of infection. 11/30; patient presents for 1 week follow-up. She states she changed her dressing last week prior to home health and use sorb act with Dakin's and Hydrofera Blue. Home health has changed the dressing as well and they have been using sorbact. T oday she reports increased redness to her right lower extremity. She has a history of cellulitis to this leg. She has been using silver alginate to the right great toe. Unfortunately she had an episode of diarrhea prior to coming in and had feces all over the right leg and to the wrap of her left leg. 12/7; patient presents for 1 week follow-up. She states that home health did not come out to change the dressing and she took it off yesterday. It is unclear if she is dressing the right toe wound. She denies signs of infection. 12/14; patient presents for 1 week follow-up. She has no issues or complaints today. 12/21; patient presents for follow-up. She has no  issues or complaints today. She denies signs of infection. 12/28/2021; patient presents for follow-up. She was hospitalized for sepsis secondary to right lower extremity cellulitis On 12/23. She states she is currently at a SNF. She states that she was started on doxycycline this morning for her right great toe swelling and redness. She is not sure what dressings have been done to her left lower extremity for the past 3 weeks. She says its been mainly gauze with an Ace wrap. 1/25; patient presents for follow-up. She is still residing in a skilled nursing facility. She reports mild pain to the left lower extremity wound bed. She states she is going to see a podiatrist soon. 2/8; patient presents for follow-up. She has moved back to her residential community from her skilled nursing facility. She has no issues or complaints today. She denies signs of systemic infections. 2/15; patient presents for follow-up. He has no issues or complaints today. She denies systemic signs of infection. 2/22; patient presents for follow-up. She has no issues or complaints today. She denies signs of infection. 3/1; patient presents for follow-up. She states that home health came out the day after she was seen in our clinic and yesterday to do the wrap change. She denies signs of infection. She reports excoriated skin on the ankle. 3/8; patient presents for follow-up. She has no issues or complaints today. She denies signs of infection. 3/15; patient presents for follow-up. Home health has been coming out to change the dressings. She reports more tenderness to the wound site. She denies purulent drainage, increased warmth or erythema to the area. 4/5; patient presents for follow-up. She has missed her last 2 clinic appointments. I have not seen her in 3 weeks. She was recently hospitalized for altered mental status. She was involuntarily committed. She was evaluated by psychiatry and deemed  to have competency. There was no  specific cause of her altered mental status. It was concluded that her physical and mental health were declining due to her chronic medical conditions. Currently home health has been coming out for dressing changes. Patient has also been doing her own dressing changes. She reports more skin breakdown to the periwound and now has a new wound. She denies fever/chills. She reports continued tenderness to the wound site. 4/12; patient with significant venous insufficiency and a large wound on her left lower leg taking up about 80% of the circumference of her lower leg. Cultures of this grew MRSA and Pseudomonas. She had completed a course of ciprofloxacin now is starting doxycycline. She has been using Dakin's wet-to-dry and a Tubigrip. She has home health twice a week and we change it once. 4/19; patient presents for follow-up. She completed her course of doxycycline. She has been using Dakin's wet-to-dry dressing and Tubigrip. Home health changes the dressing twice weekly. Currently she has no issues or complaints. 4/26; patient presents for follow-up. At last clinic visit orders for home health were Iodosorb under compression therapy. Unfortunately they did not have the dressing and have been using Dakin's and gentamicin under the wrap. Patient currently denies signs of infection. She has no issues or complaints today. 5/3; patient presents for follow-up. Again Iodosorb has not been used under the compression therapy when home health comes out to change the wrap and dressing. They have been using Sorbact. It is unclear why this is happening since we send orders weekly to the agency. She denies signs of infection. Patient has not purchased the [name] antibiotics. We reached out to the company and they said they have been trying to contact her on a regular basis. We gave the patient the number to call to order the medication. 5/10; patient presents for follow-up. She has no issues or complaints today.  Again home health has not been using Iodosorb. Mepilex was on the wound bed. No other dressings noted. She brought in her Keystone antibiotics. She denies signs of infection. 5/17; patient presents for follow-up. Home health has come out twice since she was last seen. Joint well she has been using Keystone antibiotic with Sorbact under the compression wrap. She has no issues or complaints today. She denies signs of infection. 5/24; patient presents for follow-up. We have been using Keystone antibiotics with Sorbact under compression therapy. She is tolerating the treatment well. She is reporting improvement in wound healing. She denies signs of infection. 5/31; patient presents for follow-up. We continue to do [facility] antibiotics with Sorbact under compression therapy. She continues to report improvement in wound healing. Home health comes out and changes the dressing once weekly. 05-17-2022 upon evaluation today patient appears to be doing better in regard to her wound especially compared to the last time I saw her. Fortunately I do think that she is seeing improvements. With that being said I do believe that she may be benefit from sharp debridement today to clear away some of the necrotic debris I discussed that with her as well. She is an amendable to that plan. Otherwise she is very pleased with how the [facility] is doing for her. 6/14; patient presents for follow-up. We have been using Keystone antibiotic with Sorbact and absorbent dressings under 3 layer compression. She has no issues or complaints today. She reports improvement in wound healing. She denies signs of infection. 6/21; patient presents for follow-up. We are continuing with [facility] antibiotic and Sorbact under  3 layer compression. Patient has no complaints. Continued wound healing is happening. She denies signs of infection. 6/28; patient presents for follow-up. We have been using Keystone antibiotic with Sorbact under 3 layer  compression. Usually home health comes out and changes the dressing twice a week. Unfortunately they did not go out to change the dressing. It is unclear why. Patient did not call them. She currently denies signs of infection. YAQUELIN, REICKS ([id number]) [id number] Page 10 of 14 7/5; patient presents for follow-up. We have been using Keystone antibiotic with calcium alginate under 3 layer compression. She reports improvement in wound healing. She denies signs of infection. Home health has come out to do dressing changes twice this past week. 7/12; patient presents for follow-up. We have been using Keystone antibiotic with calcium alginate under 3 layer compression. Patient states that home health came out once last week to change the dressing. She reports improvement in wound healing. She currently denies signs of infection. 7/19; patient presents for follow-up. We have been using Keystone antibiotic with calcium alginate under 3 layer compression. Home health came out once last week to change the dressing. She has no issues or complaints today. She denies signs of infection. 8/2; patient presents for follow-up. We have been using Keystone antibiotic with calcium alginate under 3 layer compression. Unfortunately she missed her appointment last week and home health did not come out to do dressing changes. Patient currently denies signs of infection. 8/9; patient presents for follow-up. We have been using Keystone with calcium alginate under 3 layer compression. She states that home health came out once last week. She currently denies signs of infection. Her wrap was completely wet. She states she was cleaning the top of the leg and water soaked down into the wrap. 8/16; patient presents for follow-up. We have been using Keystone with calcium alginate under 3 layer compression. She states that home health came out twice last week. She has no issues or complaints  today. 8/23; patient presents for follow-up. He has been using Keystone with calcium alginate under 3 layer compression. Home health came out twice last week. She denies signs of infection. 8/30; patient presents for follow-up. We have been using Keystone with calcium alginate under 3 layer compression. Home health came out once last week to change the dressing. Patient reports improvement in wound healing. She states she is almost done with her chemotherapy infusions and has 1 more left. 9/13; patient presents for follow-up. She has lost the capsules to her [facility] antibiotic which I believe is the vancomycin pills. She has her Zosyn powder today. We have been using Keystone antibiotic ointment with calcium alginate under 3 layer compression. She is concerned about systemic infection however her vitals are stable and there is no surrounding soft tissue infection. She would like to remain a patient in our wound care center however would like a second opinion for her wound care at another facility. She asked to be referred to [facility] wound care center. 9/20; patient presents for follow-up. She found her vancomycin capsules and brought in her complete Keystone antibiotic ointment set today. Unfortunately she has developed skin breakdown and Erythema to the right lower extremity With increased swelling. She states she went to a pow wow Over the weekend and was on her feet for extended periods of time. She saw her oncologist yesterday who prescribed her doxycycline for her right lower extremity erythema. 9/27; patient presents for follow-up. We have been using Keystone antibiotic with Aquacel under 3  layer compression to the lower extremities bilaterally. When home health came and changed the wrap she secretly put coffee into the spray mix along with [facility] antibiotic on her leg thinking the acidic component would better activate the zoysn (sonething she discussed with her microbiologist brother). She has  reported improvement in wound healing. 10/4; patient presents for follow-up. She has no issues or complaints today. We have been doing Aquacel and keystone under 3 layer compression to the lower extremities bilaterally. This morning she took the right lower extremity wrap off as it was uncomfortable. She has no open wounds to this leg. 10/11; patient presents for follow-up. We have been doing Aquacel with Keystone antibiotic ointment under 3 layer compression to the left lower extremity. She developed a small blister to the anterior aspect of the left leg noticed when the wrap was taken off on intake. She currently denies signs of infection. 10/18; patient presents for follow-up. We have been doing Aquacel with Keystone antibiotic ointment under 3 layer compression to the left lower extremity. There has been continued improvement in wound healing. She denies signs of infection. 10/25; patient arrives for treatment of venous insufficiency ulcers on her left lower leg both lateral and medial are remanence of apparently a circumferential wound. Much improved. We are using topicals Keystone and Aquacel Ag under 3 layer compression we continue to make good progress. The patient talk to me at some length with regards to different things she has on her forehead and her Peri orbital area for which she is apparently applying Maupin. She feels that what ever we are treating on her wounds is a more systemic problem. I really was not able to get a handle on what she is talking about however I did caution her not to put the [place] in her eyes. 11/1; her wounds continue to improve she is using Keystone and Aquacel Ag G under 3 layer compression. Our intake nurse notes erythema and edema in the right leg. The patient has a litany of concerns with regards to a rash on her forehead or ears and other systemic complaints. She has an appointment with dermatology on November 11 11/8; patient presents for follow-up. We  have been using Keystone and Aquacel under 4-layer compression. She has no issues or complaints today. She reports improvement in wound healing. 11/15; patient presents for follow-up. We have been using Aquacel with Keystone antibiotic under 3 layer compression. Patient continues improvement in wound healing. 12/6; patient presents for follow-up. We have been using Aquacel with Keystone antibiotic ointment under 3 layer compression. Wounds appear well-healing. 12/13; patient presents for follow-up. We have been using Aquacel with Keystone antibiotic under 3 layer compression. She has no issues or complaints today. 12/27 left lateral medial ankle. Superficial wounds remain there is significantly improved we are using Keystone backed with Zetuvit under 4-layer compression 1/3; patient presents for follow-up. Her wounds appear well-healing. We have been using Aquacel Ag with Keystone antibiotic ointment under compression therapy. This should be a 3 layer compression. 1/17; patient presents for follow-up. Her wounds on her left lower extremity are well-healing. We are using Aquacel Ag and Keystone antibiotic ointment under compression therapy. She missed her last clinic appointment. She states that the wrap has been changed twice weekly since she was last seen. Unfortunately she has developed increased warmth and redness to the right lower extremity consistent with cellulitis. She states this started a few days ago. 1/24; patient presents for follow-up. Her wounds on the left lower extremity  are well-healing with Aquacel and Keystone antibiotic ointment under compression therapy. I prescribed Keflex at last clinic visit for cellulitis of the right lower extremity. Unfortunately this has not resolved. She did not follow-up with her PCP for contact our office about her symptoms. 1/31; patient presents for follow-up. We have been using Aquacel and Keystone antibiotic ointment under compression therapy to the  left lower extremity. Wounds appear well-healing. She has been taking clindamycin for the past week. Her symptoms have improved slightly with a decrease in erythema and warmth to the right lower extremity. She says her pain level has improved. However Symptoms have not completely resolved. She denies fever/chills, nausea/vomiting. 2/7; patient presents for follow-up. We have been using Aquacel Ag with Keystone antibiotic ointment under compression therapy to the left lower extremity. For the past week home health has not been using Keystone antibiotic ointment. She continues to take clindamycin. Her symptoms have improved greatly with the decrease in erythema and warmth to the right lower extremity. She denies systemic signs of infection. She has an area of skin breakdown to the right anterior leg. KAYDI, KORNBERG ([id number]) [id number] Page 11 of 14 2/14; Patient presents for follow-up. She has been using Hydrofera Blue to the right anterior leg under Tubigrip. It looks like Hydrofera Blue is also being used with Keystone to the left lower extremity under compression therapy. Order is for Aqualcell Ag. Overall wounds appear well healing. She has no issues or complaints. 2/21; patient presents for follow-up. We have been using Hydrofera Blue under 3 layer compression to the lower extremities bilaterally. Her right lower extremity wounds have healed. She has a small open wound remaining to her left lateral leg. 2/28; patient presents for follow-up. We have been using Hydrofera Blue under 3 layer compression. Home health has changed the dressing to the left lower extremity however started the wrap at the ankle. 3/6; patient presents for follow-up. We have been using PolyMem with antibiotic ointment under 3 layer compression to the left lower extremity. She has no issues or complaints today. 3/12; patient presents for follow-up. We have been using Aquacel Ag under 3  layer compression to the left lower extremity. Her wound is healed. She has juxta lite compression wraps at home. Readmission: 03-13-2023 upon evaluation today patient appears to be doing poorly in regard to her lower extremities. Specifically it is the left lower extremity at this point that is causing her problems she has multiple wounds open. With that being said she does not sound like she has been wearing her compression stockings on a regular basis she has previously seen Dr. Mikey Bussing. With that being said upon evaluation today she tells me that since the wounds have reopened that she has been having a lot of discomfort she also tells me that she went back to using some of the dressings that she had leftover at home she does not know exactly what everything was. Nonetheless she also tells me that she has made an appointment with a dermatologist in town and has to see them tomorrow therefore she is not going to allow [place] to wrap her legs today since "that did not work anyway". With that being said obviously she did improve last time we got her wound healed we order her juxta lite compression wraps again it does not sound like she has been using those appropriately at home. 4/10; patient presents for follow-up. Unfortunately she reopened last week and was seen by [facility] At that time the patient declined in  office wraps. She states she is wearing her juxta lite compression wraps however she did not have these on today. 4/17; patient presents for follow-up. At last clinic visit a PCR culture was done that grew Staph aureus and Enterobacter cola CA and Enterococcus bacillus. Keystone antibiotic ointment was ordered. She has not received this yet. We have been using antibiotic ointment with Hydrofera Blue under compression therapy. There is been improvement in her wound healing. She has no issues or complaints today. 4/24; patient presents for follow-up. She received Keystone antibiotic ointment and this  was started 3 days ago when home health came to change the wrap. She has no issues or complaints today. 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 She has been using Keystone Aquacel Ag under compression. She tells me she is recently started on chemotherapy for metastatic breast cancer with "bone mets" I am not exactly sure at this point what drugs she is receiving. Objective Constitutional Sitting or standing Blood Pressure is within target range for patient.. Pulse regular and within target range for patient.Marland Kitchen Respirations regular, non-labored and within target range.. Temperature is normal and within the target range for the patient.Marland Kitchen appears in no distress. Vitals Time Taken: 9:56 AM, Height: 66 in, Weight: 153 lbs, BMI: 24.7, Temperature: 97.7 F, Pulse: 68 bpm, Respiratory Rate: 16 breaths/min, Blood Pressure: 147/71 mmHg. General Notes: Wound exam; left lower leg. She presented with a blister in the left anterior upper leg this is unroofed and actually looks clean. She has 2 larger areas on the left posterior calf and 1 anteriorly all of these covered in very adherent slough. I used an open curette to remove as much of this as I could there was minimal bleeding. Her edema is fairly well-controlled there is no evidence of infection Integumentary (Hair, Skin) Wound #10 status is Open. Original cause of wound was Gradually Appeared. The date acquired was: 03/04/2023. The wound has been in treatment 5 weeks. The wound is located on the Left,Posterior Lower Leg. The wound measures 3.7cm length x 4.7cm width x 0.2cm depth; 13.658cm^2 area and 2.732cm^3 volume. There is Fat Layer (Subcutaneous Tissue) exposed. There is a medium amount of serosanguineous drainage noted. The wound margin is distinct with the outline attached to the wound base. There is  small (1-33%) red granulation within the wound bed. There is a large (67-100%) amount of necrotic tissue within the wound bed. Wound #11 status is Open. Original cause of wound was Gradually Appeared. The date acquired was: 03/04/2023. The wound has been in treatment 5 weeks. The wound is located on the Left,Lateral Lower Leg. The wound measures 1.5cm length x 1cm width x 0.02cm depth; 1.178cm^2 area and 0.024cm^3 volume. There is Fat Layer (Subcutaneous Tissue) exposed. There is a small amount of serosanguineous drainage noted. The wound margin is distinct with the outline attached to the wound base. There is large (67-100%) red granulation within the wound bed. There is a small (1-33%) amount of necrotic tissue within the wound bed including Adherent Slough. Wound #12 status is Open. Original cause of wound was Gradually Appeared. The date acquired was: 03/04/2023. The wound has been in treatment 5 weeks. The wound is located on the Left,Medial Ankle. The wound measures 1.9cm length x 1cm width x 0.2cm depth; 1.492cm^2 area and 0.298cm^3 volume. There is Fat Layer (Subcutaneous Tissue) exposed. There is a medium amount  of serosanguineous drainage noted. The wound margin is distinct with the outline attached to the wound base. There is small (1-33%) pink granulation within the wound bed. There is a large (67-100%) amount of necrotic tissue within the wound bed including Adherent Slough. Wound #13 status is Open. Original cause of wound was Not Known. The date acquired was: 02/14/2023. The wound has been in treatment 2 weeks. The wound is located on the Left,Lateral,Posterior Lower Leg. The wound measures 5.3cm length x 3cm width x 0.2cm depth; 12.488cm^2 area and 2.498cm^3 volume. There is Fat Layer (Subcutaneous Tissue) exposed. There is a medium amount of serosanguineous drainage noted. There is medium (34-66%) red granulation within the wound bed. There is a small (1-33%) amount of necrotic tissue  within the wound bed including Adherent Slough. MAYBELLINE, HAMBEL ([id number]) [id number] Page 12 of 14 Assessment Active Problems ICD-10 Non-pressure chronic ulcer of other part of left lower leg with fat layer exposed Chronic venous hypertension (idiopathic) with ulcer of left lower extremity Chronic venous hypertension (idiopathic) with ulcer of right lower extremity Venous insufficiency (chronic) (peripheral) Long term (current) use of anticoagulants Essential (primary) hypertension Secondary malignant neoplasm of breast Cellulitis of right lower limb Unspecified open wound, right lower leg, initial encounter Procedures Wound #10 Pre-procedure diagnosis of Wound #10 is a Venous Leg Ulcer located on the Left,Posterior Lower Leg .Severity of Tissue Pre Debridement is: Fat layer exposed. There was a Selective/Open Wound Non-Viable Tissue Debridement with a total area of 13.65 sq cm performed by Maxwell Caul, MD. With the following instrument(s): Curette to remove Viable and Non-Viable tissue/material. Material removed includes Eschar and Slough and. A time out was conducted at 10:30, prior to the start of the procedure. A Minimum amount of bleeding was controlled with Pressure. The procedure was tolerated well. Post Debridement Measurements: 3.7cm length x 4.7cm width x 0.2cm depth; 2.732cm^3 volume. Character of Wound/Ulcer Post Debridement is stable. Severity of Tissue Post Debridement is: Fat layer exposed. Post procedure Diagnosis Wound #10: Same as Pre-Procedure Pre-procedure diagnosis of Wound #10 is a Venous Leg Ulcer located on the Left,Posterior Lower Leg . There was a Three Layer Compression Therapy Procedure with a pre-treatment ABI of 1.5 by Betha Loa. Post procedure Diagnosis Wound #10: Same as Pre-Procedure Wound #11 Pre-procedure diagnosis of Wound #11 is a Venous Leg Ulcer located on the Left,Lateral Lower Leg .Severity of Tissue Pre  Debridement is: Fat layer exposed. There was a Selective/Open Wound Non-Viable Tissue Debridement with a total area of 1.18 sq cm performed by Maxwell Caul, MD. With the following instrument(s): Curette to remove Viable and Non-Viable tissue/material. Material removed includes Slough. A time out was conducted at 10:32, prior to the start of the procedure. A Minimum amount of bleeding was controlled with Pressure. The procedure was tolerated well. Post Debridement Measurements: 1.5cm length x 1cm width x 0.2cm depth; 0.236cm^3 volume. Character of Wound/Ulcer Post Debridement is stable. Severity of Tissue Post Debridement is: Fat layer exposed. Post procedure Diagnosis Wound #11: Same as Pre-Procedure Wound #13 Pre-procedure diagnosis of Wound #13 is a Venous Leg Ulcer located on the Left,Lateral,Posterior Lower Leg .Severity of Tissue Pre Debridement is: Fat layer exposed. There was a Selective/Open Wound Non-Viable Tissue Debridement with a total area of 12.48 sq cm performed by Maxwell Caul, MD. With the following instrument(s): Curette to remove Viable and Non-Viable tissue/material. Material removed includes Eschar and Slough and. A time out was conducted at 10:31, prior to the start of the procedure.  A Minimum amount of bleeding was controlled with Pressure. The procedure was tolerated well. Post Debridement Measurements: 5.3cm length x 3cm width x 0.2cm depth; 2.498cm^3 volume. Character of Wound/Ulcer Post Debridement is stable. Severity of Tissue Post Debridement is: Fat layer exposed. Post procedure Diagnosis Wound #13: Same as Pre-Procedure Plan Follow-up Appointments: Return Appointment in 1 week. Nurse Visit as needed Home Health: Home Health Company: [facility] [phone] **Please direct any NON-WOUND related issues/requests for orders to patient's Primary Care Physician. **If current dressing causes regression in wound condition, may D/C ordered dressing product/s and  apply Normal Saline Moist Dressing daily until next Wound Healing Center or Other MD appointment. **Notify Wound Healing Center of regression in wound condition at [phone]. Bathing/ Shower/ Hygiene: May shower with wound dressing protected with water repellent cover or cast protector. Edema Control - Lymphedema / Segmental Compressive Device / Other: Optional: One layer of unna paste to top of compression wrap (to act as an anchor). Elevate, Exercise Daily and Avoid Standing for Long Periods of Time. Elevate legs to the level of the heart and pump ankles as often as possible Elevate leg(s) parallel to the floor when sitting. DO YOUR BEST to sleep in the bed at night. DO NOT sleep in your recliner. Long hours of sitting in a recliner leads to swelling of the legs and/or potential wounds on your backside. Additional Orders / Instructions: Follow Nutritious Diet and Increase Protein Intake WOUND #10: - Lower Leg Wound Laterality: Left, Posterior Topical: Keystone 3 x Per Week/30 Days Prim Dressing: Aquacel Extra Hydrofiber Dressing, 4x5 (in/in) 3 x Per Week/30 Days ary Secondary Dressing: ABD Pad 5x9 (in/in) 3 x Per Week/30 Days Discharge Instructions: Cover with ABD pad ARON, SARA ([id number]) [id number] Page 13 of 14 Com pression Wrap: 3-LAYER WRAP - Profore Lite LF 3 Multilayer Compression Bandaging System 3 x Per Week/30 Days Discharge Instructions: Apply 3 multi-layer wrap as prescribed. WOUND #11: - Lower Leg Wound Laterality: Left, Lateral Topical: Keystone 3 x Per Week/30 Days Prim Dressing: Aquacel Extra Hydrofiber Dressing, 4x5 (in/in) 3 x Per Week/30 Days ary Secondary Dressing: ABD Pad 5x9 (in/in) 3 x Per Week/30 Days Discharge Instructions: Cover with ABD pad Com pression Wrap: 3-LAYER WRAP - Profore Lite LF 3 Multilayer Compression Bandaging System 3 x Per Week/30 Days Discharge Instructions: Apply 3 multi-layer wrap as  prescribed. WOUND #12: - Ankle Wound Laterality: Left, Medial Topical: Keystone 3 x Per Week/30 Days Prim Dressing: Aquacel Extra Hydrofiber Dressing, 4x5 (in/in) 3 x Per Week/30 Days ary Secondary Dressing: ABD Pad 5x9 (in/in) 3 x Per Week/30 Days Discharge Instructions: Cover with ABD pad Com pression Wrap: 3-LAYER WRAP - Profore Lite LF 3 Multilayer Compression Bandaging System 3 x Per Week/30 Days Discharge Instructions: Apply 3 multi-layer wrap as prescribed. 1. Debridement with an open curette as noted removing very adherent fibrinous surface slough and debris. 2. I did not change the Keystone and Aquacel Ag dressing still under compression 3. She also is talking about moving to [place] sometime next month. I did not have much in the way of conversation with her except the patient says that her daughter is insisting on this. Electronic Signature(s) Signed: 04/18/2023 4:41:35 PM By: Baltazar Najjar MD Entered By: Baltazar Najjar on 04/18/2023 10:44:35 -------------------------------------------------------------------------------- SuperBill Details Patient Name: Date of Service: [name], Morton Peters [name] 04/18/2023 Medical Record Number: [id number] Patient Account Number: [id number] Date of Birth/Sex: Treating RN: [birth year] (77 y.o. Ginette Pitman Primary Care Provider: Marisue Ivan  Other Clinician: Betha Loa Referring Provider: Treating Provider/Extender: RO BSO N, MICHA EL Dixie Dials Weeks in Treatment: 93 Diagnosis Coding ICD-10 Codes Code Description [phone] 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 I87.311 Chronic venous hypertension (idiopathic) with ulcer of right lower extremity I87.2 Venous insufficiency (chronic) (peripheral) Z79.01 Long term (current) use of anticoagulants I10 Essential (primary) hypertension C79.81  Secondary malignant neoplasm of breast L03.115 Cellulitis of right lower limb S81.801A Unspecified open wound, right lower leg, initial encounter Facility Procedures : CPT4 Code: [id number] Description: 97597 - DEBRIDE WOUND 1ST 20 SQ CM OR < ICD-10 Diagnosis Description 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.312 Chronic venous hypertension (idiopathic) with ulcer of left lower  extremity Modifier: Quantity: 1 : CPT4 Code: [id number] Description: 97598 - DEBRIDE WOUND EA ADDL 20 SQ CM ICD-10 Diagnosis Description 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 Modifier: Quantity: 1 Physician Procedures : CPT4 Code Description Modifier [id number] 97597 - WC PHYS DEBR WO ANESTH 20 SQ CM ICD-10 Diagnosis Description 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sed [name] ([id number])  [id number]. L87.564 Chronic venous hypertension (idiopathic) with ulcer of left lower extremity Quantity: 1 pdf Page 14 of 14 : [id number] 97598 - WC PHYS DEBR WO ANESTH EA ADD 20 CM 1 ICD-10 Diagnosis Description 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.312 Chronic venous hypertension (idiopathic) with ulcer of left lower  extremity Quantity: Electronic Signature(s) Signed: 04/18/2023 4:41:35 PM By: Baltazar Najjar MD Entered By: Baltazar Najjar on 04/18/2023 10:44:57

## 2023-04-23 ENCOUNTER — Encounter: Payer: Self-pay | Admitting: Oncology

## 2023-04-23 ENCOUNTER — Other Ambulatory Visit: Payer: Self-pay | Admitting: Oncology

## 2023-04-25 ENCOUNTER — Encounter: Payer: Medicare Other | Admitting: Internal Medicine

## 2023-04-26 ENCOUNTER — Other Ambulatory Visit: Payer: Self-pay | Admitting: Oncology

## 2023-04-27 ENCOUNTER — Encounter: Payer: Self-pay | Admitting: Oncology

## 2023-04-27 ENCOUNTER — Ambulatory Visit: Payer: Medicare Other | Admitting: Oncology

## 2023-04-27 ENCOUNTER — Ambulatory Visit: Payer: Medicare Other

## 2023-05-02 ENCOUNTER — Encounter (HOSPITAL_BASED_OUTPATIENT_CLINIC_OR_DEPARTMENT_OTHER): Payer: Medicare Other | Admitting: Internal Medicine

## 2023-05-02 DIAGNOSIS — I87311 Chronic venous hypertension (idiopathic) with ulcer of right lower extremity: Secondary | ICD-10-CM | POA: Diagnosis not present

## 2023-05-02 DIAGNOSIS — I87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: Secondary | ICD-10-CM

## 2023-05-02 DIAGNOSIS — L97822 Non-pressure chronic ulcer of other part of left lower leg with fat layer exposed: Secondary | ICD-10-CM

## 2023-05-04 ENCOUNTER — Inpatient Hospital Stay: Payer: Medicare Other

## 2023-05-04 ENCOUNTER — Encounter: Payer: Self-pay | Admitting: Oncology

## 2023-05-04 ENCOUNTER — Telehealth: Payer: Self-pay | Admitting: *Deleted

## 2023-05-04 ENCOUNTER — Other Ambulatory Visit: Payer: Self-pay | Admitting: *Deleted

## 2023-05-04 ENCOUNTER — Inpatient Hospital Stay: Payer: Medicare Other | Attending: Oncology | Admitting: Oncology

## 2023-05-04 VITALS — BP 130/76 | HR 68 | Temp 98.3°F | Resp 18 | Ht 64.0 in | Wt 141.6 lb

## 2023-05-04 DIAGNOSIS — C7951 Secondary malignant neoplasm of bone: Secondary | ICD-10-CM | POA: Insufficient documentation

## 2023-05-04 DIAGNOSIS — Z79899 Other long term (current) drug therapy: Secondary | ICD-10-CM

## 2023-05-04 DIAGNOSIS — C779 Secondary and unspecified malignant neoplasm of lymph node, unspecified: Secondary | ICD-10-CM | POA: Insufficient documentation

## 2023-05-04 DIAGNOSIS — I129 Hypertensive chronic kidney disease with stage 1 through stage 4 chronic kidney disease, or unspecified chronic kidney disease: Secondary | ICD-10-CM | POA: Insufficient documentation

## 2023-05-04 DIAGNOSIS — Z7901 Long term (current) use of anticoagulants: Secondary | ICD-10-CM | POA: Diagnosis not present

## 2023-05-04 DIAGNOSIS — C50911 Malignant neoplasm of unspecified site of right female breast: Secondary | ICD-10-CM | POA: Insufficient documentation

## 2023-05-04 DIAGNOSIS — Z5181 Encounter for therapeutic drug level monitoring: Secondary | ICD-10-CM | POA: Diagnosis not present

## 2023-05-04 DIAGNOSIS — Z170 Estrogen receptor positive status [ER+]: Secondary | ICD-10-CM | POA: Insufficient documentation

## 2023-05-04 DIAGNOSIS — Z5111 Encounter for antineoplastic chemotherapy: Secondary | ICD-10-CM | POA: Insufficient documentation

## 2023-05-04 DIAGNOSIS — Z5112 Encounter for antineoplastic immunotherapy: Secondary | ICD-10-CM | POA: Diagnosis not present

## 2023-05-04 DIAGNOSIS — N184 Chronic kidney disease, stage 4 (severe): Secondary | ICD-10-CM | POA: Diagnosis not present

## 2023-05-04 DIAGNOSIS — C50919 Malignant neoplasm of unspecified site of unspecified female breast: Secondary | ICD-10-CM

## 2023-05-04 DIAGNOSIS — Z86718 Personal history of other venous thrombosis and embolism: Secondary | ICD-10-CM | POA: Insufficient documentation

## 2023-05-04 MED ORDER — HEPARIN SOD (PORK) LOCK FLUSH 100 UNIT/ML IV SOLN
500.0000 [IU] | Freq: Once | INTRAVENOUS | Status: AC | PRN
  Administered 2023-05-04: 500 [IU]
  Filled 2023-05-04: qty 5

## 2023-05-04 MED ORDER — TRASTUZUMAB-DKST CHEMO 150 MG IV SOLR
6.0000 mg/kg | Freq: Once | INTRAVENOUS | Status: AC
  Administered 2023-05-04: 420 mg via INTRAVENOUS
  Filled 2023-05-04: qty 20

## 2023-05-04 MED ORDER — SODIUM CHLORIDE 0.9% FLUSH
10.0000 mL | INTRAVENOUS | Status: DC | PRN
  Filled 2023-05-04: qty 10

## 2023-05-04 MED ORDER — DIPHENHYDRAMINE HCL 25 MG PO CAPS
50.0000 mg | ORAL_CAPSULE | Freq: Once | ORAL | Status: AC
  Administered 2023-05-04: 50 mg via ORAL
  Filled 2023-05-04: qty 2

## 2023-05-04 MED ORDER — SODIUM CHLORIDE 0.9 % IV SOLN
420.0000 mg | Freq: Once | INTRAVENOUS | Status: AC
  Administered 2023-05-04: 420 mg via INTRAVENOUS
  Filled 2023-05-04: qty 14

## 2023-05-04 MED ORDER — FENTANYL 25 MCG/HR TD PT72: MEDICATED_PATCH | TRANSDERMAL | 0 refills | Status: DC

## 2023-05-04 MED ORDER — ACETAMINOPHEN 325 MG PO TABS
650.0000 mg | ORAL_TABLET | Freq: Once | ORAL | Status: AC
  Administered 2023-05-04: 650 mg via ORAL
  Filled 2023-05-04: qty 2

## 2023-05-04 MED ORDER — SODIUM CHLORIDE 0.9 % IV SOLN
Freq: Once | INTRAVENOUS | Status: AC
  Filled 2023-05-04: qty 250

## 2023-05-04 NOTE — Patient Instructions (Signed)
[facility]  Discharge Instructions: Thank you for choosing [facility] to provide your oncology and hematology care.  If you have a lab appointment with the Cancer Center, please go directly to the Cancer Center and check in at the registration area.  Wear comfortable clothing and clothing appropriate for easy access to any Portacath or PICC line.   We strive to give you quality time with your provider. You may need to reschedule your appointment if you arrive late (15 or more minutes).  Arriving late affects you and other patients whose appointments are after yours.  Also, if you miss three or more appointments without notifying the office, you may be dismissed from the clinic at the provider's discretion.      For prescription refill requests, have your pharmacy contact our office and allow 72 hours for refills to be completed.    Today you received the following chemotherapy and/or immunotherapy agents herceptin, perjeta      To help prevent nausea and vomiting after your treatment, we encourage you to take your nausea medication as directed.  BELOW ARE SYMPTOMS THAT SHOULD BE REPORTED IMMEDIATELY: *FEVER GREATER THAN 100.4 F (38 C) OR HIGHER *CHILLS OR SWEATING *NAUSEA AND VOMITING THAT IS NOT CONTROLLED WITH YOUR NAUSEA MEDICATION *UNUSUAL SHORTNESS OF BREATH *UNUSUAL BRUISING OR BLEEDING *URINARY PROBLEMS (pain or burning when urinating, or frequent urination) *BOWEL PROBLEMS (unusual diarrhea, constipation, pain near the anus) TENDERNESS IN MOUTH AND THROAT WITH OR WITHOUT PRESENCE OF ULCERS (sore throat, sores in mouth, or a toothache) UNUSUAL RASH, SWELLING OR PAIN  UNUSUAL VAGINAL DISCHARGE OR ITCHING   Items with * indicate a potential emergency and should be followed up as soon as possible or go to the Emergency Department if any problems should occur.  Please show the CHEMOTHERAPY ALERT CARD or IMMUNOTHERAPY ALERT CARD at  check-in to the Emergency Department and triage nurse.  Should you have questions after your visit or need to cancel or reschedule your appointment, please contact [facility]  [phone] and follow the prompts.  Office hours are 8:00 a.m. to 4:30 p.m. Monday - Friday. Please note that voicemails left after 4:00 p.m. may not be returned until the following business day.  We are closed weekends and major holidays. You have access to a nurse at all times for urgent questions. Please call the main number to the clinic [phone] and follow the prompts.  For any non-urgent questions, you may also contact your provider using MyChart. We now offer e-Visits for anyone 18 and older to request care online for non-urgent symptoms. For details visit [email or web address].   Also download the MyChart app! Go to the app store, search "MyChart", open the app, select [place], and log in with your MyChart username and password.    

## 2023-05-04 NOTE — Telephone Encounter (Signed)
Baird Lyons came in today and wanted dr Smith Robert to fill out papers for living will , and guardian for [employer]. Dr. Smith Robert wanted to know if she can do it because she does not have license in [place]. She got Josh to look into it . It says you have to be MD in [place] or any MD that is is in a state that is beside the state of [place]. I called daughter and left message that we could not do do to the rules of [place]. Also she did make her a DNR but to our info it is only good in [place].

## 2023-05-04 NOTE — Progress Notes (Signed)
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  She had a screening mammogram in September 2014 which showed 2.1 x 2.3 x 1.8 cm irregular mass in her right breast.  It was ER 10% positive PR negative and HER2 positive +3.  She received neoadjuvant chemotherapy with Taxol Herceptin and Perjeta for 4 cycles followed by dose dense AC/Herceptin x4 which she completed in March 2015.  She had a right lumpectomy on 04/03/2014 which showed scant residual invasive ductal carcinoma [id number].  She completed 1 year of adjuvant Herceptin chemotherapy and also completed adjuvant radiation treatment.  She was recommended anastrozole which she took on and off starting November 2015 and stopped sometime in 2020.   She was then hospitalized with neck pain and was found to have lytic lesions involving C5-C6 with pathological vertebral fractures.  She underwent radiation treatment to this area.  Image guided biopsy of the L1 vertebral body showed metastatic carcinoma consistent with breast origin ER 10% PR 0% and HER2 amplified ratio 5.1 average HER2 signal number per cell 15.0 average CEP 17 signals number per cell 3.0.  Baseline  echocardiogram on 05/02/2021 showed a normal EF of 62% she was recommended Taxol Herceptin and Perjeta which she received for 2 cycles at [facility] until June 10, 2021   She has chronic pain from her bone metastases for which she is currently on oxycodone 10 mg every 4 hours as needed and 12 mcg fentanyl patch.  She was seeing pain clinic when she was living in [place].   Patient has also been on Zometa when she was in [place] but she does have some ongoing dental issues.  She has received Xgeva in the past as well.  Her last Rivka Barbara was in July 2021.  Last PET scan was on 03/21/2021 which showed diffuse osseous metastatic disease involving the head neck chest abdomen and pelvis and spine.  Left lung apex hypermetabolic nodule and multiple hypermetabolic liver lesions concerning for disease involvement.  Enlarged hypermetabolic left inguinal lymph nodes along with hypermetabolic external iliac and left supraclavicular lymph nodes   Patient is now moved to [place] to be close to her daughter.  She lives in an independent living.  Patient received Taxol Herceptin and Perjeta for about a year and was subsequently on Herceptin and Perjeta maintenance.  She took that until November 2023 and following that treatments were on hold due to drop in ejection fraction.  EF improved in April 2024 and treatments were restarted  Interval history-patient will be moving to [place] on 06/02/2023.  Overall patient feels at her baseline.  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  Reports occasional trouble with her balance.  She does not have any recent falls  ECOG PS- 2 Pain scale-  0   Review of systems- Review of Systems  Constitutional:  Positive for malaise/fatigue. Negative for chills, fever and weight loss.  HENT:  Negative for congestion, ear discharge and nosebleeds.   Eyes:  Negative for blurred vision.  Respiratory:  Negative for cough, hemoptysis, sputum production,  shortness of breath and wheezing.   Cardiovascular:  Negative for chest pain, palpitations, orthopnea and claudication.  Gastrointestinal:  Negative for abdominal pain, blood in stool, constipation, diarrhea, heartburn, melena, nausea and vomiting.  Genitourinary:  Negative for dysuria, flank pain, frequency, hematuria and urgency.  Musculoskeletal:  Negative for back pain, joint pain and myalgias.  Skin:  Negative for rash.  Neurological:  Negative for dizziness, tingling, focal weakness, seizures, weakness and headaches.  Endo/Heme/Allergies:  Does not bruise/bleed easily.  Psychiatric/Behavioral:  Negative for depression and suicidal ideas. The patient does not have insomnia.       Allergies  Allergen Reactions   Other Diarrhea and Nausea And Vomiting    Pt has had episodes of nausea, vomiting and diarrhea right after receiving IV technetium for NM study on two occasions.   Corticosteroids Other (See Comments)    Pt trf from [place] and per primary md for her cancer tx. Notes that it causes agitation intolerance   Sulfa Antibiotics Other (See Comments)    Pt moved from [place] and in MD notes she has allergy but we do not know reactions when taking the drug   Celebrex [Celecoxib] Rash     Past Medical History:  Diagnosis Date   ADHD (attention deficit hyperactivity disorder)    in [place], no date on md note   Anemia    IDA 11/26/2019, Anemia in stage 4 chronic kidney disease (HCC) 09/03/2020   Arthritis    osteoarthritis right knee 09/30/2014   Breast cancer (HCC) 10/05/2013   in [place] -, Her 2 is 3+   Cancer related pain 03/28/2021   in [place], md notes spine mets   cervical compression fracture 03/18/2021   in [place]   DVT of lower extremity, bilateral (HCC) 03/30/2014   in [place]   Generalized muscle weakness 03/31/2016   in [place]   GERD (gastroesophageal reflux disease) 05/15/2013   per md in [place]   Hyperparathyroidism, secondary (HCC) 04/25/2018   in [place]   Hypertension  02/20/2016   info from MD in [place]   Memory loss 02/05/2015   in [place]   Metabolic acidosis 01/27/2018   in [place]   Metabolic syndrome 02/20/2016   in [place]   Osteopenia after menopause 03/29/2016   in [place]   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   in [place]   Stasis ulcer of left lower extremity (HCC) 03/29/2016   in [place]     Past Surgical History:  Procedure Laterality Date   CESAREAN SECTION     unknown   fibroid removed  N/A    in [place] - unknown date   IR FLUORO GUIDE CV LINE LEFT  07/27/2021   IR PORT REPAIR CENTRAL VENOUS ACCESS DEVICE Left    In [place]   MOHS SURGERY N/A 02/25/2014   in [place]   ovary removed      unknown   PORTA CATH INSERTION N/A 02/13/2022   Procedure: PORTA CATH INSERTION;  Surgeon: Annice Needy, MD;  Location: [facility] INVASIVE CV LAB;  Service: Cardiovascular;  Laterality: N/A;   REMV CATARACT EXTRACAP,INSERT LENS Bilateral  Bilateral 09/05/2012   in [place]   TONSILLECTOMY     unknown  LUMPECTOMY Right 04/03/2014   in [place]    Social History   Socioeconomic History   Marital status: Divorced    Spouse name: Not on file   Number of children: Not on file   Years of education: Not on file   Highest education level: Not on file  Occupational History   Occupation: retired Chief Financial Officer    Comment: In [employer]  Tobacco Use   Smoking status: Never   Smokeless tobacco: Never  Vaping Use   Vaping Use: Never used  Substance and Sexual Activity   Alcohol use: Not Currently   Drug use: Not Currently   Sexual activity: Not Currently  Other Topics Concern   Not on file  Social History Narrative   Not on file   Social Determinants of Health   Financial Resource Strain: Low Risk  (03/17/2022)   Overall Financial Resource Strain (CARDIA)    Difficulty of Paying Living Expenses: Not hard at all  Food Insecurity: No Food Insecurity (03/17/2022)   Hunger Vital Sign    Worried About Running Out of Food in the Last Year: Never true    Ran Out of  Food in the Last Year: Never true  Transportation Needs: No Transportation Needs (03/17/2022)   PRAPARE - Administrator, Civil Service (Medical): No    Lack of Transportation (Non-Medical): No  Physical Activity: Inactive (03/17/2022)   Exercise Vital Sign    Days of Exercise per Week: 0 days    Minutes of Exercise per Session: 0 min  Stress: No Stress Concern Present (03/17/2022)   [employer] of Occupational Health - Occupational Stress Questionnaire    Feeling of Stress : Only a little  Social Connections: Socially Isolated (03/17/2022)   Social Connection and Isolation Panel [NHANES]    Frequency of Communication with Friends and Family: Twice a week    Frequency of Social Gatherings with Friends and Family: Twice a week    Attends Religious Services: Never    Database administrator or Organizations: No    Attends Banker Meetings: Never    Marital Status: Widowed  Intimate Partner Violence: Not At Risk (03/17/2022)   Humiliation, Afraid, Rape, and Kick questionnaire    Fear of Current or Ex-Partner: No    Emotionally Abused: No    Physically Abused: No    Sexually Abused: No    Family History  Problem Relation Age of Onset   Pancreatic cancer Mother    Stroke Father    Diabetes Father    Hypertension Father    Heart disease Father    Skin cancer Father    Varicose Veins Father    Skin cancer Brother    Cancer - Prostate Brother      Current Outpatient Medications:    amphetamine-dextroamphetamine (ADDERALL) 20 MG tablet, Take 1 tablet (20 mg total) by mouth daily., Disp: 30 tablet, Rfl: 0   calcium citrate (CALCITRATE - DOSED IN MG ELEMENTAL CALCIUM) 950 (200 Ca) MG tablet, Take 200 mg of elemental calcium by mouth daily., Disp: , Rfl:    clindamycin (CLEOCIN) 300 MG capsule, Take 300 mg by mouth every 6 (six) hours., Disp: , Rfl:    empagliflozin (JARDIANCE) 10 MG TABS tablet, Take 1 tablet (10 mg total) by mouth daily before breakfast., Disp:  30 tablet, Rfl: 11   fentaNYL (DURAGESIC) 25 MCG/HR, PLACE 1 PATCH ONTO THE SKIN EVERY THREE DAYS AS DIRECTED., Disp: 10 patch, Rfl: 0  fluocinonide (LIDEX) 0.05 % external solution, Apply once or twice daily to scalp as needed for itching.  Avoid applying to face, groin, and axilla., Disp: 60 mL, Rfl: 2   gabapentin (NEURONTIN) 100 MG capsule, TAKE 1 CAPSULE BY MOUTH 3 TIMES DAILY, Disp: 90 capsule, Rfl: 1   hydrocortisone 2.5 % cream, Apply 1 to 2 times daily for itchy rash at face, scalp, ears, and forehead., Disp: 30 g, Rfl: 3   hydrOXYzine (ATARAX) 25 MG tablet, Take by mouth., Disp: , Rfl:    ketoconazole (NIZORAL) 2 % cream, APPLY ON FEET AND BETWEEN TOES EVERY EVENING AS DIRECTED., Disp: 60 g, Rfl: 2   ketoconazole (NIZORAL) 2 % shampoo, apply every other day  massage into scalp and leave in for 5 minutes before rinsing out, Disp: 120 mL, Rfl: 3   lisinopril (ZESTRIL) 20 MG tablet, Take 1 tablet (20 mg total) by mouth daily., Disp: 30 tablet, Rfl: 3   LORazepam (ATIVAN) 0.5 MG tablet, Take 1 tablet (0.5 mg total) by mouth daily as needed for anxiety., Disp: 30 tablet, Rfl: 0   metoprolol succinate (TOPROL-XL) 25 MG 24 hr tablet, Take 1 tablet (25 mg total) by mouth daily. Take with or immediately following a meal., Disp: 90 tablet, Rfl: 3   Multiple Vitamin (MULTI-VITAMIN) tablet, Take 1 tablet by mouth daily., Disp: , Rfl:    naloxone (NARCAN) nasal spray 4 mg/0.1 mL, SPRAY 1 SPRAY INTO ONE NOSTRIL AS DIRECTED FOR OPIOID OVERDOSE (TURN PERSON ON SIDE AFTER DOSE. IF NO RESPONSE IN 2-3 MINUTES OR PERSON RESPONDS BUT RELAPSES, REPEAT USING A NEW SPRAY DEVICE AND SPRAY INTO THE OTHER NOSTRIL. CALL 911 AFTER USE.) * EMERGENCY USE ONLY *, Disp: 1 each, Rfl: 0   NON FORMULARY, Apply 1 Dose topically daily. Pt mixes up piperacillim and tazobactam and puts it on the wounds of left leg, Disp: , Rfl:    ondansetron (ZOFRAN) 8 MG tablet, Take 1 tablet (8 mg total) by mouth as needed for nausea or  vomiting., Disp: 30 tablet, Rfl: 0   Oxycodone HCl 10 MG TABS, Take 1.5 tablets (15 mg total) by mouth every 6 (six) hours as needed., Disp: 90 tablet, Rfl: 0   sodium hypochlorite (DAKIN'S 1/4 STRENGTH) 0.125 % SOLN, Apply 1 application topically as directed. Moisten gauze with solution and wrap wound, Disp: , Rfl:    XARELTO 20 MG TABS tablet, TAKE ONE TABLET BY MOUTH EVERY DAY WITH SUPPER, Disp: 30 tablet, Rfl: 5   DULoxetine (CYMBALTA) 30 MG capsule, Take by mouth. (Patient not taking: Reported on 05/04/2023), Disp: , Rfl:  No current facility-administered medications for this visit.  Facility-Administered Medications Ordered in Other Visits:    heparin lock flush 100 UNIT/ML injection, , , ,   Physical exam:  Vitals:   05/04/23 0938  BP: 130/76  Pulse: 68  Resp: 18  Temp: 98.3 F (36.8 C)  TempSrc: Tympanic  SpO2: (!) 18%  Weight: 141 lb 9.6 oz (64.2 kg)  Height: 5\' 4"  (1.626 m)   Physical Exam Constitutional:      Comments: Ambulates with a walker.  Appears in no acute distress  Cardiovascular:     Rate and Rhythm: Normal rate and regular rhythm.     Heart sounds: Normal heart sounds.  Pulmonary:     Effort: Pulmonary effort is normal.     Breath sounds: Normal breath sounds.  Abdominal:     General: Bowel sounds are normal.     Palpations: Abdomen is  soft.  Skin:    General: Skin is warm and dry.  Neurological:     Mental Status: She is alert and oriented to person, place, and time.         Latest Ref Rng & Units 02/07/2023   11:15 AM  CMP  Glucose 70 - 99 mg/dL 161   BUN 8 - 23 mg/dL 36   Creatinine 0.96 - 1.00 mg/dL 0.45   Sodium 409 - 811 mmol/L 138   Potassium 3.5 - 5.1 mmol/L 4.5   Chloride 98 - 111 mmol/L 105   CO2 22 - 32 mmol/L 25   Calcium 8.9 - 10.3 mg/dL 8.5   Total Protein 6.5 - 8.1 g/dL 7.2   Total Bilirubin 0.3 - 1.2 mg/dL 0.5   Alkaline Phos 38 - 126 U/L 65   AST 15 - 41 U/L 19   ALT 0 - 44 U/L 11       Latest Ref Rng & Units 02/07/2023    11:15 AM  CBC  WBC 4.0 - 10.5 K/uL 4.9   Hemoglobin 12.0 - 15.0 g/dL 91.4   Hematocrit 78.2 - 46.0 % 35.4   Platelets 150 - 400 K/uL 193     No images are attached to the encounter.  NM Bone Scan Whole Body  Result Date: 04/20/2023 CLINICAL DATA:  Metastatic breast cancer EXAM: NUCLEAR MEDICINE WHOLE BODY BONE SCAN TECHNIQUE: Whole body anterior and posterior images were obtained approximately 3 hours after intravenous injection of radiopharmaceutical. RADIOPHARMACEUTICALS:  22.16 mCi Technetium-38m MDP IV COMPARISON:  12/25/2022 Correlation: CT chest abdomen pelvis 12/25/2022, CT cervical spine 11/04/2022 FINDINGS: Increased tracer uptake at shoulders RIGHT greater than LEFT, sternoclavicular joints, elbows, hips, RIGHT knee, typically degenerative. Uptake at upper thoracic and lower cervical spine corresponding to extensive sclerotic metastatic disease on prior CT. Additional sclerotic foci are identified within the lower thoracic and upper lumbar spine, also corresponding to sclerotic metastatic lesions. No new sites of abnormal osseous tracer accumulation are identified. Decreased uptake of tracer within the mandible and maxilla, likely related to dental disease. Expected urinary tract and soft tissue distribution of tracer. IMPRESSION: Stable uptake at the thoracic, cervical, and upper lumbar spine consistent with sclerotic osseous metastatic disease as seen on prior CT exams. Extensive degenerative type uptake at multiple joints. No new scintigraphic abnormalities to suggest progressive osseous metastatic disease. Electronically Signed   By: Ulyses Southward M.D.   On: 04/20/2023 16:45     Assessment and plan- Patient is a 77 y.o. 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  She is here for next cycle of Herceptin and Perjeta  I have reviewed bone scan images independently and discussed findings with the patient which shows overall stable  bone metastases.  For some reason she did not get systemic scans done but we will plan to get a PET CT scan in the next 2 weeks.  I will see her back in 3 weeks for her last treatment here before she moves to [place].  We are making a referral for her to be seen by oncology at the [facility] system in [place] which is the closest to her.  Zometa at next visit.  I will plan to get an echocardiogram prior to her visit with me as well.  If overall scan shows stable disease I think it would be reasonable to continue with Herceptin and Perjeta as long as her echocardiogram remains stable.  She  has a history of DVT for which she is on chronic Xarelto and she will continue that.   Visit Diagnosis 1. Primary malignant neoplasm of breast with metastasis (HCC)      Dr. Owens Shark, MD, MPH [facility] [phone] 05/04/2023 9:54 AM

## 2023-05-08 ENCOUNTER — Ambulatory Visit: Payer: Medicare Other | Admitting: Dermatology

## 2023-05-08 ENCOUNTER — Encounter: Payer: Self-pay | Admitting: Oncology

## 2023-05-09 ENCOUNTER — Encounter (HOSPITAL_BASED_OUTPATIENT_CLINIC_OR_DEPARTMENT_OTHER): Payer: Medicare Other | Admitting: Internal Medicine

## 2023-05-09 DIAGNOSIS — L97822 Non-pressure chronic ulcer of other part of left lower leg with fat layer exposed: Secondary | ICD-10-CM | POA: Diagnosis not present

## 2023-05-09 DIAGNOSIS — I87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: Secondary | ICD-10-CM | POA: Diagnosis not present

## 2023-05-09 DIAGNOSIS — I87311 Chronic venous hypertension (idiopathic) with ulcer of right lower extremity: Secondary | ICD-10-CM | POA: Diagnosis not present

## 2023-05-11 ENCOUNTER — Telehealth: Payer: Self-pay | Admitting: *Deleted

## 2023-05-11 NOTE — Telephone Encounter (Signed)
I called Britta Mccreedy to see if there is a sooner ECHO from 6/20. Britta Mccreedy states she has one 6/18 arrival medical mall at 10:45 for 11 am ECHO. I then called and left message to Baird Lyons to see if that works for them.wait for her to respond

## 2023-05-15 ENCOUNTER — Ambulatory Visit: Admission: RE | Admit: 2023-05-15 | Payer: Medicare Other | Source: Ambulatory Visit

## 2023-05-16 ENCOUNTER — Encounter: Payer: Medicare Other | Attending: Internal Medicine | Admitting: Internal Medicine

## 2023-05-16 DIAGNOSIS — L97822 Non-pressure chronic ulcer of other part of left lower leg with fat layer exposed: Secondary | ICD-10-CM | POA: Diagnosis present

## 2023-05-16 DIAGNOSIS — Z86718 Personal history of other venous thrombosis and embolism: Secondary | ICD-10-CM | POA: Insufficient documentation

## 2023-05-16 DIAGNOSIS — Z7901 Long term (current) use of anticoagulants: Secondary | ICD-10-CM | POA: Diagnosis not present

## 2023-05-16 DIAGNOSIS — I87313 Chronic venous hypertension (idiopathic) with ulcer of bilateral lower extremity: Secondary | ICD-10-CM | POA: Insufficient documentation

## 2023-05-16 DIAGNOSIS — I129 Hypertensive chronic kidney disease with stage 1 through stage 4 chronic kidney disease, or unspecified chronic kidney disease: Secondary | ICD-10-CM | POA: Diagnosis not present

## 2023-05-16 DIAGNOSIS — C7951 Secondary malignant neoplasm of bone: Secondary | ICD-10-CM | POA: Diagnosis not present

## 2023-05-16 DIAGNOSIS — N184 Chronic kidney disease, stage 4 (severe): Secondary | ICD-10-CM | POA: Insufficient documentation

## 2023-05-16 DIAGNOSIS — C50912 Malignant neoplasm of unspecified site of left female breast: Secondary | ICD-10-CM | POA: Insufficient documentation

## 2023-05-21 ENCOUNTER — Other Ambulatory Visit: Payer: Self-pay | Admitting: Oncology

## 2023-05-21 ENCOUNTER — Ambulatory Visit
Admission: RE | Admit: 2023-05-21 | Discharge: 2023-05-21 | Disposition: A | Discharge status: Home / Self Care | Payer: Medicare Other | Source: Ambulatory Visit | Attending: Oncology | Admitting: Oncology

## 2023-05-21 DIAGNOSIS — I700 Atherosclerosis of aorta: Secondary | ICD-10-CM | POA: Diagnosis not present

## 2023-05-21 DIAGNOSIS — C50919 Malignant neoplasm of unspecified site of unspecified female breast: Secondary | ICD-10-CM | POA: Insufficient documentation

## 2023-05-21 DIAGNOSIS — C7951 Secondary malignant neoplasm of bone: Secondary | ICD-10-CM | POA: Diagnosis not present

## 2023-05-21 LAB — GLUCOSE, CAPILLARY: Glucose-Capillary: 79 mg/dL (ref 70–99)

## 2023-05-21 MED ORDER — OXYCODONE HCL 10 MG PO TABS: 15.0000 mg | ORAL_TABLET | Freq: Four times a day (QID) | ORAL | 0 refills | Status: DC | PRN

## 2023-05-21 MED ORDER — FLUDEOXYGLUCOSE F - 18 (FDG) INJECTION
7.3000 | Freq: Once | INTRAVENOUS | Status: AC | PRN
  Administered 2023-05-21: 7.95 via INTRAVENOUS

## 2023-05-22 ENCOUNTER — Ambulatory Visit (INDEPENDENT_AMBULATORY_CARE_PROVIDER_SITE_OTHER): Payer: Medicare Other | Admitting: Dermatology

## 2023-05-22 DIAGNOSIS — L219 Seborrheic dermatitis, unspecified: Secondary | ICD-10-CM

## 2023-05-22 DIAGNOSIS — I872 Venous insufficiency (chronic) (peripheral): Secondary | ICD-10-CM

## 2023-05-22 DIAGNOSIS — B353 Tinea pedis: Secondary | ICD-10-CM

## 2023-05-22 DIAGNOSIS — M793 Panniculitis, unspecified: Secondary | ICD-10-CM

## 2023-05-22 MED ORDER — HYDROCORTISONE 2.5 % EX CREA
TOPICAL_CREAM | CUTANEOUS | 2 refills | Status: AC
Start: 2023-05-22 — End: ?

## 2023-05-22 MED ORDER — KETOCONAZOLE 2 % EX SHAM
MEDICATED_SHAMPOO | CUTANEOUS | 3 refills | Status: DC
Start: 2023-05-22 — End: 2023-05-22

## 2023-05-22 MED ORDER — FLUOCINONIDE 0.05 % EX SOLN
CUTANEOUS | 2 refills | Status: DC
Start: 2023-05-22 — End: 2023-05-22

## 2023-05-22 MED ORDER — KETOCONAZOLE 2 % EX CREA: TOPICAL_CREAM | CUTANEOUS | 2 refills | Status: AC

## 2023-05-22 MED ORDER — FLUOCINONIDE 0.05 % EX SOLN
CUTANEOUS | 2 refills | Status: AC
Start: 2023-05-22 — End: ?

## 2023-05-22 MED ORDER — KETOCONAZOLE 2 % EX SHAM
MEDICATED_SHAMPOO | CUTANEOUS | 3 refills | Status: AC
Start: 2023-05-22 — End: ?

## 2023-05-22 MED ORDER — KETOCONAZOLE 2 % EX CREA: TOPICAL_CREAM | CUTANEOUS | 2 refills | Status: DC

## 2023-05-22 MED ORDER — HYDROCORTISONE 2.5 % EX CREA
TOPICAL_CREAM | CUTANEOUS | 2 refills | Status: DC
Start: 2023-05-22 — End: 2023-05-22

## 2023-05-22 NOTE — Patient Instructions (Addendum)
Continue Ketoconazole 2% shampoo as directed: apply every other day massage into scalp and leave in for 5 minutes before rinsing out.   Continue Hydrocortisone 2.5% twice daily to ears and forehead as needed for itching.   Continue Lidex scalp solution 1-2 times daily to affected areas at scalp as needed for flares   Continue ketoconazole 2% cream to affected areas at feet and face.  Due to recent changes in healthcare laws, you may see results of your pathology and/or laboratory studies on MyChart before the doctors have had a chance to review them. We understand that in some cases there may be results that are confusing or concerning to you. Please understand that not all results are received at the same time and often the doctors may need to interpret multiple results in order to provide you with the best plan of care or course of treatment. Therefore, we ask that you please give [place] 2 business days to thoroughly review all your results before contacting the office for clarification. Should we see a critical lab result, you will be contacted sooner.   If You Need Anything After Your Visit  If you have any questions or concerns for your doctor, please call our main line at [phone] and press option 4 to reach your doctor's medical assistant. If no one answers, please leave a voicemail as directed and we will return your call as soon as possible. Messages left after 4 pm will be answered the following business day.   You may also send [place] a message via MyChart. We typically respond to MyChart messages within 1-2 business days.  For prescription refills, please ask your pharmacy to contact our office. Our fax number is [phone].  If you have an urgent issue when the clinic is closed that cannot wait until the next business day, you can page your doctor at the number below.    Please note that while we do our best to be available for urgent issues outside of office hours, we are not  available 24/7.   If you have an urgent issue and are unable to reach [place], you may choose to seek medical care at your doctor's office, retail clinic, urgent care center, or emergency room.  If you have a medical emergency, please immediately call 911 or go to the emergency department.  Pager Numbers  - Dr. Gwen Pounds: [phone]  - Dr. Neale Burly: [phone]  - Dr. Roseanne Reno: [phone]  In the event of inclement weather, please call our main line at [phone] for an update on the status of any delays or closures.  Dermatology Medication Tips: Please keep the boxes that topical medications come in in order to help keep track of the instructions about where and how to use these. Pharmacies typically print the medication instructions only on the boxes and not directly on the medication tubes.   If your medication is too expensive, please contact our office at [phone] option 4 or send [place] a message through MyChart.   We are unable to tell what your co-pay for medications will be in advance as this is different depending on your insurance coverage. However, we may be able to find a substitute medication at lower cost or fill out paperwork to get insurance to cover a needed medication.   If a prior authorization is required to get your medication covered by your insurance company, please allow [place] 1-2 business days to complete this process.  Drug prices often vary depending on where the prescription is  filled and some pharmacies may offer cheaper prices.  The website www.goodrx.com contains coupons for medications through different pharmacies. The prices here do not account for what the cost may be with help from insurance (it may be cheaper with your insurance), but the website can give you the price if you did not use any insurance.  - You can print the associated coupon and take it with your prescription to the pharmacy.  - You may also stop by our office during regular business hours  and pick up a GoodRx coupon card.  - If you need your prescription sent electronically to a different pharmacy, notify our office through [facility] or by phone at [phone] option 4.

## 2023-05-22 NOTE — Progress Notes (Signed)
Follow-Up Visit   Subjective  Laura Santiago is a 77 y.o. female who presents for the following: seb derm and tinea follow up. Patient currently using ketoconazole 2% cream at feet and ketoconazole 2% shampoo, HC 2.5% cream and Lidex scalp solution for seb derm. Patient is moving out of state and is here to get refills until she can establish care in [place]. She is seeing Wound Care for ulcers at legs.   The following portions of the chart were reviewed this encounter and updated as appropriate: medications, allergies, medical history  Review of Systems:  No other skin or systemic complaints except as noted in HPI or Assessment and Plan.  Objective  Well appearing patient in no apparent distress; mood and affect are within normal limits.   A focused examination was performed of the following areas: Legs, feet, scalp, face  Relevant exam findings are noted in the Assessment and Plan.    Assessment & Plan    SEBORRHEIC DERMATITIS Exam: erythema with excoriations at scalp, hairline  Chronic and persistent condition with duration or expected duration over one year. Condition is symptomatic/ bothersome to patient. Not currently at goal but improving.  Seborrheic Dermatitis is a chronic persistent rash characterized by pinkness and scaling most commonly of the mid face but also can occur on the scalp (dandruff), ears; mid chest, mid back and groin.  It tends to be exacerbated by stress and cooler weather.  People who have neurologic disease may experience new onset or exacerbation of existing seborrheic dermatitis.  The condition is not curable but treatable and can be controlled.  Treatment Plan: Continue Ketoconazole 2% shampoo as directed: apply every other day massage into scalp and leave in for 5 minutes before rinsing out.   Continue Hydrocortisone 2.5% twice daily to ears and forehead as needed for itching.   Continue Lidex scalp solution qd/bid to aas scalp prn flares     Seborrheic dermatitis  Related Medications hydrocortisone 2.5 % cream Apply 1 to 2 times daily for itchy rash at face, scalp, ears, and forehead.  ketoconazole (NIZORAL) 2 % shampoo apply every other day  massage into scalp and leave in for 5 minutes before rinsing out  fluocinonide (LIDEX) 0.05 % external solution Apply once or twice daily to scalp as needed for itching.  Avoid applying to face, groin, and axilla.  TINEA PEDIS Exam: Scaling and maceration web spaces and over distal and lateral soles.  Treatment Plan: Continue Ketoconazole 2% cream to feet/toes and between toes every night at bedtime.   STASIS DERMATITIS With stasis ulcers Exam: Multiple small ulcerations R lower leg. Also mild surrounding erythema.   Chronic and persistent condition with duration or expected duration over one year. Condition is symptomatic/ bothersome to patient. Not currently at goal.    Stasis in the legs causes chronic leg swelling, which may result in itchy or painful rashes, skin discoloration, skin texture changes, and sometimes ulceration.  Recommend daily graduated compression hose/stockings- easiest to put on first thing in morning, remove at bedtime.  Elevate legs as much as possible. Avoid salt/sodium rich foods.   Treatment Plan: Continue following up with [facility].  Discussed with patient that she needs to continue to f/up with wound treatment, since she is at higher risk for infection, which could have severe outcomes.   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 Condition is symptomatic/ bothersome to patient. Not currently at goal.    Lipodermatosclerosis is a chronic inflammatory condition of unknown cause of the subcutaneous fat causing tenderness, discoloration and hardening of the involved skin,  most commonly on the lower legs. Discussed that it may progress and gradually worsen over time, especially in the setting of chronic leg swelling. Daily compression stockings/hose is recommended.    Recommend [facility] Hypochlorous Spray (found in the wound care section) on. The [facility] Hypochlorous Spray can be sprayed on daily and left on.  Apply topical antibiotic ointment and cover   Continue follow up with [facility].   Return if symptoms worsen or fail to improve, pt is moving to [place].  Anise Salvo, RMA, am acting as scribe for Willeen Niece, MD .   Documentation: I have reviewed the above documentation for accuracy and completeness, and I agree with the above.  Willeen Niece, MD

## 2023-05-23 ENCOUNTER — Encounter (HOSPITAL_BASED_OUTPATIENT_CLINIC_OR_DEPARTMENT_OTHER): Payer: Medicare Other | Admitting: Internal Medicine

## 2023-05-23 DIAGNOSIS — I87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: Secondary | ICD-10-CM

## 2023-05-23 DIAGNOSIS — L97822 Non-pressure chronic ulcer of other part of left lower leg with fat layer exposed: Secondary | ICD-10-CM | POA: Diagnosis not present

## 2023-05-23 DIAGNOSIS — I87313 Chronic venous hypertension (idiopathic) with ulcer of bilateral lower extremity: Secondary | ICD-10-CM | POA: Diagnosis not present

## 2023-05-24 ENCOUNTER — Encounter: Payer: Self-pay | Admitting: *Deleted

## 2023-05-25 ENCOUNTER — Other Ambulatory Visit: Payer: Medicare Other

## 2023-05-25 ENCOUNTER — Telehealth: Payer: Self-pay | Admitting: *Deleted

## 2023-05-25 ENCOUNTER — Ambulatory Visit: Payer: Medicare Other | Admitting: Oncology

## 2023-05-25 ENCOUNTER — Ambulatory Visit: Payer: Medicare Other

## 2023-05-25 NOTE — Telephone Encounter (Signed)
RN attempted to call patient and daughter's number. No answer, pt voicemail was not set up and nurse left a message on daughter's voicemail requesting a call back.

## 2023-05-26 NOTE — Progress Notes (Signed)
PASQUA, LISA ([id number]) [id number] Page 1 of 14 Visit Report for 05/23/2023 Arrival Information Details Patient Name: Date of Service: Laura Santiago. 05/23/2023 10:00 A Santiago Medical Record Number: [id number] Patient Account Number: [id number] Date of Birth/Sex: Treating RN: 1946/04/02 (77 y.o. Skip Mayer Primary Care Lacorey Brusca: Marisue Ivan Other Clinician: Betha Loa Referring Duana Benedict: Treating Ercell Perlman/Extender: Greta Doom in Treatment: 29 Visit Information History Since Last Visit All ordered tests and consults were completed: No Patient Arrived: [name] Added or deleted any medications: No Arrival Time: 10:08 Any new allergies or adverse reactions: No Transfer Assistance: None Had a fall or experienced change in No Patient Identification Verified: Yes activities of daily living that may affect Secondary Verification Process Completed: Yes risk of falls: Patient Requires Transmission-Based No Signs or symptoms of abuse/neglect since last visito No Precautions: Hospitalized since last visit: No Patient Has Alerts: Yes Implantable device outside of the clinic excluding No Patient Alerts: PT HAS SERVICE cellular tissue based products placed in the center ANIMAL since last visit: ABI 07/11/21 Has Dressing in Place as Prescribed: Yes R) 1.16 L) 1.27 Has Compression in Place as Prescribed: Yes Pain Present Now: No Electronic Signature(s) Signed: 05/23/2023 4:43:31 PM By: Betha Loa Entered By: Betha Loa on 05/23/2023 10:09:24 -------------------------------------------------------------------------------- Clinic Level of Care Assessment Details Patient Name: Date of Service: Laura Santiago. 05/23/2023 10:00 A Santiago Medical Record Number: [id number] Patient Account Number: [id number] Date of Birth/Sex: Treating RN: Nov 09, 1946 (77 y.o. Skip Mayer Primary Care Ashritha Desrosiers: Marisue Ivan Other Clinician: Betha Loa Referring Yanuel Tagg: Treating Jetta Murray/Extender: Greta Doom in Treatment: 49 Clinic Level of Care Assessment Items TOOL 1 Quantity Score []  - 0 Use when EandM and Procedure is performed on INITIAL visit ASSESSMENTS - Nursing Assessment / Reassessment []  - 0 General Physical Exam (combine w/ comprehensive assessment (listed just below) when performed on new pt. [address]IRATZE, CURLER ([id number]) [id number] Page 2 of 14 []  - 0 Comprehensive Assessment (HX, ROS, Risk Assessments, Wounds Hx, etc.) ASSESSMENTS - Wound and Skin Assessment / Reassessment []  - 0 Dermatologic / Skin Assessment (not related to wound area) ASSESSMENTS - Ostomy and/or Continence Assessment and Care []  - 0 Incontinence Assessment and Management []  - 0 Ostomy Care Assessment and Management (repouching, etc.) PROCESS - Coordination of Care []  - 0 Simple Patient / Family Education for ongoing care []  - 0 Complex (extensive) Patient / Family Education for ongoing care []  - 0 Staff obtains Chiropractor, Records, T Results / Process Orders est []  - 0 Staff telephones HHA, Nursing Homes / Clarify orders / etc []  - 0 Routine Transfer to another Facility (non-emergent condition) []  - 0 Routine Hospital Admission (non-emergent condition) []  - 0 New Admissions / Manufacturing engineer / Ordering NPWT Apligraf, etc. , []  - 0 Emergency Hospital Admission (emergent condition) PROCESS - Special Needs []  - 0 Pediatric / Minor Patient Management []  - 0 Isolation Patient Management []  - 0 Hearing / Language / Visual special needs []  - 0 Assessment of Community assistance (transportation, D/C planning, etc.) []  - 0 Additional assistance / Altered mentation []  - 0 Support Surface(s) Assessment (bed, cushion, seat, etc.) INTERVENTIONS - Miscellaneous []  - 0 External ear exam []  - 0 Patient Transfer (multiple  staff / Nurse, adult / Similar devices) []  - 0 Simple Staple / Suture removal (25 or less) []  - 0 Complex Staple / Suture removal (26 or more) []  - 0 Hypo/Hyperglycemic Management (do not  check if billed separately) []  - 0 Ankle / Brachial Index (ABI) - do not check if billed separately Has the patient been seen at the hospital within the last three years: Yes Total Score: 0 Level Of Care: ____ Electronic Signature(s) Signed: 05/23/2023 4:43:31 PM By: Betha Loa Entered By: Betha Loa on 05/23/2023 10:38:22 -------------------------------------------------------------------------------- Compression Therapy Details Patient Name: Date of Service: Laura Muscat NNIE Santiago. 05/23/2023 10:00 A Santiago Medical Record Number: [id number] Patient Account Number: [id number] Date of Birth/Sex: Treating RN: 03-29-1946 (77 y.o. Skip Mayer Primary Care Natan Hartog: Marisue Ivan Other Clinician: Calen, Nally ([id number]) [id number] Page 3 of 14 Referring Adolf Ormiston: Treating Avril Busser/Extender: Greta Doom in Treatment: 98 Compression Therapy Performed for Wound Assessment: Wound #10 Left,Posterior Lower Leg Performed By: Farrel Gordon, [name], Compression Type: Three Layer Pre Treatment ABI: 1.3 Post Procedure Diagnosis Same as Pre-procedure Electronic Signature(s) Signed: 05/23/2023 4:43:31 PM By: Betha Loa Entered By: Betha Loa on 05/23/2023 10:37:58 -------------------------------------------------------------------------------- Encounter Discharge Information Details Patient Name: Date of Service: Laura Santiago, Laura Peters NNIE Santiago. 05/23/2023 10:00 A Santiago Medical Record Number: [id number] Patient Account Number: [id number] Date of Birth/Sex: Treating RN: 1946/06/25 (77 y.o. Skip Mayer Primary Care Tanzania Basham: Marisue Ivan Other Clinician: Betha Loa Referring Datrell Dunton: Treating Lodie Waheed/Extender: Greta Doom in Treatment: 30 Encounter Discharge Information Items Post Procedure Vitals Discharge Condition: Stable Temperature (F): 98.2 Ambulatory Status: Walker Pulse (bpm): 88 Discharge Destination: Home Respiratory Rate (breaths/min): 18 Transportation: Private Auto Blood Pressure (mmHg): 142/78 Accompanied By: self Schedule Follow-up Appointment: Yes Clinical Summary of Care: Electronic Signature(s) Signed: 05/23/2023 4:43:31 PM By: Betha Loa Entered By: Betha Loa on 05/23/2023 10:59:17 -------------------------------------------------------------------------------- Lower Extremity Assessment Details Patient Name: Date of Service: Laura Santiago. 05/23/2023 10:00 A Santiago Medical Record Number: [id number] Patient Account Number: [id number] Date of Birth/Sex: Treating RN: May 16, 1946 (77 y.o. Skip Mayer Primary Care Amish Mintzer: Marisue Ivan Other Clinician: Betha Loa Referring Eriyana Sweeten: Treating Curtiss Mahmood/Extender: Greta Doom in Treatment: [address] [place] Santiago ([id number]) [id number] Page 4 of 14 Edema Assessment Assessed: [Left: Yes] [Right: No] Edema: [Left: Ye] [Right: s] Calf Left: Right: Point of Measurement: 36 cm From Medial Instep 34 cm Ankle Left: Right: Point of Measurement: 12 cm From Medial Instep 22.5 cm Vascular Assessment Pulses: Dorsalis Pedis Palpable: [Left:Yes] Electronic Signature(s) Signed: 05/23/2023 4:43:31 PM By: Betha Loa Signed: 05/25/2023 12:16:56 PM By: Elliot Gurney, BSN, RN, CWS, [name] RN, BSN Entered By: Betha Loa on 05/23/2023 10:26:03 -------------------------------------------------------------------------------- Multi Wound Chart Details Patient Name: Date of Service: Laura Santiago, Laura Peters NNIE Santiago. 05/23/2023 10:00 A Santiago Medical Record Number: [id number] Patient Account Number: [id number] Date of Birth/Sex: Treating RN: 1946/02/22 (77 y.o. [name], [name] Primary Care Codie Hainer: Marisue Ivan Other Clinician: Betha Loa Referring Laurin Morgenstern: Treating Aeisha Minarik/Extender: Greta Doom in Treatment: 13 Vital Signs Height(in): 66 Pulse(bpm): 88 Weight(lbs): 153 Blood Pressure(mmHg): 142/78 Body Mass Index(BMI): 24.7 Temperature(F): 98.2 Respiratory Rate(breaths/min): 18 [10:Photos: No Photos] Left, Posterior Lower Leg Left, Lateral Lower Leg Left, Medial Ankle Wound Location: Gradually Appeared Gradually Appeared Gradually Appeared Wounding Event: Venous Leg Ulcer Venous Leg Ulcer Venous Leg Ulcer Primary Etiology: Hypertension, Osteoarthritis, ReceivedHypertension, Osteoarthritis, ReceivedHypertension, Osteoarthritis, Received Comorbid History: Chemotherapy, Received Radiation Chemotherapy, Received Radiation Chemotherapy, Received Radiation 03/04/2023 03/04/2023 03/04/2023 Date Acquired: 10 10 10  Weeks of Treatment: Open Open Open Wound Status: MATTIE, ULINSKI ([id number]) [id number] Page 5 of 14 No No No Wound Recurrence: Yes No Yes Clustered Wound: 2.5x3.4x0.1 0.2x0.2x0.1  2x1.7x0.1 Measurements L x W x D (cm) 6.676 0.031 2.67 A (cm) : rea 0.668 0.003 0.267 Volume (cm) : 88.80% 97.70% 11.50% % Reduction in Area: 88.80% 97.80% 11.60% % Reduction in Volume: Full Thickness Without Exposed Full Thickness Without Exposed Full Thickness Without Exposed Classification: Support Structures Support Structures Support Structures Medium Small Medium Exudate Amount: Serosanguineous Serosanguineous Serosanguineous Exudate Type: red, brown red, brown red, brown Exudate Color: Distinct, outline attached Distinct, outline attached Distinct, outline attached Wound Margin: Small (1-33%) Large (67-100%) Small (1-33%) Granulation Amount: Red Red Pink Granulation Quality: Large (67-100%) Small (1-33%) Large (67-100%) Necrotic Amount: Fat Layer (Subcutaneous  Tissue): Yes Fat Layer (Subcutaneous Tissue): Yes Fat Layer (Subcutaneous Tissue): Yes Exposed Structures: Fascia: No Fascia: No Fascia: No Tendon: No Tendon: No Tendon: No Muscle: No Muscle: No Muscle: No Joint: No Joint: No Joint: No Bone: No Bone: No Bone: No N/A None None Epithelialization: Wound Number: 13 N/A N/A Photos: N/A N/A Left, Lateral, Posterior Lower Leg N/A N/A Wound Location: Not Known N/A N/A Wounding Event: Venous Leg Ulcer N/A N/A Primary Etiology: Hypertension, Osteoarthritis, ReceivedN/A N/A Comorbid History: Chemotherapy, Received Radiation 02/14/2023 N/A N/A Date Acquired: 7 N/A N/A Weeks of Treatment: Open N/A N/A Wound Status: No N/A N/A Wound Recurrence: No N/A N/A Clustered Wound: 5x3x0.2 N/A N/A Measurements L x W x D (cm) 11.781 N/A N/A A (cm) : rea 2.356 N/A N/A Volume (cm) : 37.50% N/A N/A % Reduction in Area: 37.50% N/A N/A % Reduction in Volume: Full Thickness Without Exposed N/A N/A Classification: Support Structures Medium N/A N/A Exudate Amount: Serosanguineous N/A N/A Exudate Type: red, brown N/A N/A Exudate Color: N/A N/A N/A Wound Margin: Medium (34-66%) N/A N/A Granulation Amount: Red N/A N/A Granulation Quality: Small (1-33%) N/A N/A Necrotic Amount: Fat Layer (Subcutaneous Tissue): Yes N/A N/A Exposed Structures: Fascia: No Tendon: No Muscle: No Joint: No Bone: No None N/A N/A Epithelialization: Treatment Notes Electronic Signature(s) Signed: 05/23/2023 4:43:31 PM By: Betha Loa Entered By: Betha Loa on 05/23/2023 10:26:12 Laura Santiago ([id number]) [id number] Page 6 of 14 -------------------------------------------------------------------------------- Multi-Disciplinary Care Plan Details Patient Name: Date of Service: Laura Santiago. 05/23/2023 10:00 A Santiago Medical Record Number: [id number] Patient Account Number: [id number] Date of Birth/Sex: Treating  RN: 07/31/1946 (77 y.o. [name], [name] Primary Care Paytan Recine: Marisue Ivan Other Clinician: Betha Loa Referring Lyam Provencio: Treating Ariez Neilan/Extender: Greta Doom in Treatment: 75 Active Inactive Necrotic Tissue Nursing Diagnoses: Impaired tissue integrity related to necrotic/devitalized tissue Knowledge deficit related to management of necrotic/devitalized tissue Goals: Necrotic/devitalized tissue will be minimized in the wound bed Date Initiated: 03/13/2023 Target Resolution Date: 04/27/2023 Goal Status: Active Patient/caregiver will verbalize understanding of reason and process for debridement of necrotic tissue Date Initiated: 03/13/2023 Target Resolution Date: 04/27/2023 Goal Status: Active Interventions: Assess patient pain level pre-, during and post procedure and prior to discharge Provide education on necrotic tissue and debridement process Treatment Activities: Apply topical anesthetic as ordered : 07/06/2021 Biologic debridement : 07/06/2021 Enzymatic debridement : 07/06/2021 Excisional debridement : 07/06/2021 Notes: Wound/Skin Impairment Nursing Diagnoses: Impaired tissue integrity Goals: Patient/caregiver will verbalize understanding of skin care regimen Date Initiated: 03/13/2023 Target Resolution Date: 04/27/2023 Goal Status: Active Ulcer/skin breakdown will have a volume reduction of 30% by week 4 Date Initiated: 03/13/2023 Target Resolution Date: 04/12/2023 Goal Status: Active Ulcer/skin breakdown will have a volume reduction of 50% by week 8 Date Initiated: 03/13/2023 Target Resolution Date: 03/13/2023 Goal Status: Active Ulcer/skin breakdown will have a volume reduction  of 80% by week 12 Date Initiated: 03/13/2023 Target Resolution Date: 06/12/2023 Goal Status: Active Ulcer/skin breakdown will heal within 14 weeks Date Initiated: 03/13/2023 Target Resolution Date: 06/26/2023 Goal Status: Active Interventions: Assess  patient/caregiver ability to obtain necessary supplies Assess patient/caregiver ability to perform ulcer/skin care regimen upon admission and as needed NALAYA, PETTAWAY ([id number]) [id number] Page 7 of 14 Assess ulceration(s) every visit Treatment Activities: Referred to DME Akil Hoos for dressing supplies : 07/06/2021 Skin care regimen initiated : 07/06/2021 Notes: Electronic Signature(s) Signed: 05/23/2023 4:43:31 PM By: Betha Loa Signed: 05/25/2023 12:16:56 PM By: Elliot Gurney, BSN, RN, CWS, [name] RN, BSN Entered By: Betha Loa on 05/23/2023 10:38:37 -------------------------------------------------------------------------------- Pain Assessment Details Patient Name: Date of Service: Laura Muscat NNIE Santiago. 05/23/2023 10:00 A Santiago Medical Record Number: [id number] Patient Account Number: [id number] Date of Birth/Sex: Treating RN: 28-May-1946 (77 y.o. Skip Mayer Primary Care Michaiah Maiden: Marisue Ivan Other Clinician: Betha Loa Referring Marieli Rudy: Treating Garnette Greb/Extender: Greta Doom in Treatment: 36 Active Problems Location of Pain Severity and Description of Pain Patient Has Paino No Site Locations Pain Management and Medication Current Pain Management: Electronic Signature(s) Signed: 05/23/2023 4:43:31 PM By: Betha Loa Signed: 05/25/2023 12:16:56 PM By: Elliot Gurney, BSN, RN, CWS, [name] RN, BSN Entered By: Betha Loa on 05/23/2023 10:14:07 Laura Santiago ([id number]) [id number] Page 8 of 14 -------------------------------------------------------------------------------- Patient/Caregiver Education Details Patient Name: Date of Service: Laura Santiago 6/12/2024andnbsp10:00 A Santiago Medical Record Number: [id number] Patient Account Number: [id number] Date of Birth/Gender: Treating RN: 31-Dec-1945 (77 y.o. Skip Mayer Primary Care Physician: Marisue Ivan Other Clinician: Betha Loa Referring Physician: Treating Physician/Extender: Greta Doom in Treatment: 30 Education Assessment Education Provided To: Patient Education Topics Provided Wound/Skin Impairment: Handouts: Other: continue wound care as directed Methods: Explain/Verbal Responses: State content correctly Electronic Signature(s) Signed: 05/23/2023 4:43:31 PM By: Betha Loa Entered By: Betha Loa on 05/23/2023 10:38:56 -------------------------------------------------------------------------------- Wound Assessment Details Patient Name: Date of Service: Laura Muscat NNIE Santiago. 05/23/2023 10:00 A Santiago Medical Record Number: [id number] Patient Account Number: [id number] Date of Birth/Sex: Treating RN: Oct 15, 1946 (77 y.o. [name], [name] Primary Care Haider Hornaday: Marisue Ivan Other Clinician: Betha Loa Referring Katonya Blecher: Treating Meia Emley/Extender: Claudie Fisherman Weeks in Treatment: 7 Wound Status Wound Number: 10 Primary Venous Leg Ulcer Etiology: Wound Location: Left, Posterior Lower Leg Wound Status: Open Wounding Event: Gradually Appeared Comorbid Hypertension, Osteoarthritis, Received Chemotherapy, Date Acquired: 03/04/2023 History: Received Radiation Weeks Of Treatment: 10 Clustered Wound: Yes Wound Measurements Length: (cm) 2.5 Width: (cm) 3.4 Depth: (cm) 0.1 Area: (cm) 6.676 Laura Santiago, Laura Santiago ([id number]) Volume: (cm) 0.668 % Reduction in Area: 88.8% % Reduction in Volume: 88.8% [id number] Page 9 of 14 Wound Description Classification: Full Thickness Without Exposed Suppor Wound Margin: Distinct, outline attached Exudate Amount: Medium Exudate Type: Serosanguineous Exudate Color: red, brown t Structures Foul Odor After Cleansing: No Slough/Fibrino Yes Wound Bed Granulation Amount: Small (1-33%) Exposed Structure Granulation Quality: Red Fascia Exposed: No Necrotic Amount: Large  (67-100%) Fat Layer (Subcutaneous Tissue) Exposed: Yes Tendon Exposed: No Muscle Exposed: No Joint Exposed: No Bone Exposed: No Treatment Notes Wound #10 (Lower Leg) Wound Laterality: Left, Posterior Cleanser Peri-Wound Care Topical Keystone Primary Dressing Aquacel Extra Hydrofiber Dressing, 4x5 (in/in) Secondary Dressing ABD Pad 5x9 (in/in) Discharge Instruction: Cover with ABD pad Secured With Compression Wrap 3-LAYER WRAP - Profore Lite LF 3 Multilayer Compression Bandaging System Discharge Instruction: Apply 3 multi-layer wrap as prescribed. Compression Stockings Add-Ons Electronic Signature(s) Signed: 05/23/2023 4:43:31 PM  ByBetha Loa Signed: 05/25/2023 12:16:56 PM By: Elliot Gurney BSN, RN, CWS, [name] RN, BSN Entered By: Betha Loa on 05/23/2023 10:23:56 -------------------------------------------------------------------------------- Wound Assessment Details Patient Name: Date of Service: Laura Muscat NNIE Santiago. 05/23/2023 10:00 A Santiago Medical Record Number: [id number] Patient Account Number: [id number] Date of Birth/Sex: Treating RN: 1946-04-14 (77 y.o. Skip Mayer Primary Care Julia Alkhatib: Marisue Ivan Other Clinician: Betha Loa Referring Jimmy Plessinger: Treating Choice Kleinsasser/Extender: Claudie Fisherman Weeks in Treatment: 36 Wound Status Wound Number: 11 Primary Venous Leg Ulcer Etiology: Wound Location: Left, Lateral Lower Leg Laura Santiago, Laura Santiago ([id number]) [id number] Page 10 of 14 Wound Status: Open Wounding Event: Gradually Appeared Comorbid Hypertension, Osteoarthritis, Received Chemotherapy, Date Acquired: 03/04/2023 History: Received Radiation Weeks Of Treatment: 10 Clustered Wound: No Photos Wound Measurements Length: (cm) 0.2 Width: (cm) 0.2 Depth: (cm) 0.1 Area: (cm) 0.031 Volume: (cm) 0.003 % Reduction in Area: 97.7% % Reduction in Volume: 97.8% Epithelialization: None Wound  Description Classification: Full Thickness Without Exposed Suppor Wound Margin: Distinct, outline attached Exudate Amount: Small Exudate Type: Serosanguineous Exudate Color: red, brown t Structures Foul Odor After Cleansing: No Slough/Fibrino Yes Wound Bed Granulation Amount: Large (67-100%) Exposed Structure Granulation Quality: Red Fascia Exposed: No Necrotic Amount: Small (1-33%) Fat Layer (Subcutaneous Tissue) Exposed: Yes Necrotic Quality: Adherent Slough Tendon Exposed: No Muscle Exposed: No Joint Exposed: No Bone Exposed: No Treatment Notes Wound #11 (Lower Leg) Wound Laterality: Left, Lateral Cleanser Peri-Wound Care Topical Keystone Primary Dressing Aquacel Extra Hydrofiber Dressing, 4x5 (in/in) Secondary Dressing ABD Pad 5x9 (in/in) Discharge Instruction: Cover with ABD pad Secured With Compression Wrap 3-LAYER WRAP - Profore Lite LF 3 Multilayer Compression Bandaging System Discharge Instruction: Apply 3 multi-layer wrap as prescribed. Compression Stockings Add-Ons Electronic Signature(s) Signed: 05/23/2023 4:43:31 PM By: Betha Loa Signed: 05/25/2023 12:16:56 PM By: Elliot Gurney, BSN, RN, CWS, [name] RN, BSN Entered By: Betha Loa on 05/23/2023 10:24:24 Laura Santiago ([id number]) [id number] Page 11 of 14 -------------------------------------------------------------------------------- Wound Assessment Details Patient Name: Date of Service: Laura Santiago. 05/23/2023 10:00 A Santiago Medical Record Number: [id number] Patient Account Number: [id number] Date of Birth/Sex: Treating RN: October 30, 1946 (77 y.o. [name], [name] Primary Care Kimberly Nieland: Marisue Ivan Other Clinician: Betha Loa Referring Shatavia Santor: Treating Ren Aspinall/Extender: Greta Doom in Treatment: 90 Wound Status Wound Number: 12 Primary Venous Leg Ulcer Etiology: Wound Location: Left, Medial Ankle Wound Status: Open Wounding Event:  Gradually Appeared Comorbid Hypertension, Osteoarthritis, Received Chemotherapy, Date Acquired: 03/04/2023 History: Received Radiation Weeks Of Treatment: 10 Clustered Wound: Yes Photos Wound Measurements Length: (cm) 2 Width: (cm) 1.7 Depth: (cm) 0.1 Area: (cm) 2.67 Volume: (cm) 0.267 % Reduction in Area: 11.5% % Reduction in Volume: 11.6% Epithelialization: None Wound Description Classification: Full Thickness Without Exposed Suppor Wound Margin: Distinct, outline attached Exudate Amount: Medium Exudate Type: Serosanguineous Exudate Color: red, brown t Structures Foul Odor After Cleansing: No Slough/Fibrino Yes Wound Bed Granulation Amount: Small (1-33%) Exposed Structure Granulation Quality: Pink Fascia Exposed: No Necrotic Amount: Large (67-100%) Fat Layer (Subcutaneous Tissue) Exposed: Yes Necrotic Quality: Adherent Slough Tendon Exposed: No Muscle Exposed: No Joint Exposed: No Bone Exposed: No Treatment Notes Wound #12 (Ankle) Wound Laterality: Left, Medial Cleanser Peri-Wound Care Laura Santiago, Laura Santiago ([id number]) [id number] Page 12 of 14 Topical Keystone Primary Dressing Aquacel Extra Hydrofiber Dressing, 4x5 (in/in) Secondary Dressing ABD Pad 5x9 (in/in) Discharge Instruction: Cover with ABD pad Secured With Compression Wrap 3-LAYER WRAP - Profore Lite LF 3 Multilayer Compression Bandaging System Discharge Instruction: Apply 3 multi-layer wrap as prescribed. Compression Stockings  Add-Ons Electronic Signature(s) Signed: 05/23/2023 4:43:31 PM By: Betha Loa Signed: 05/25/2023 12:16:56 PM By: Elliot Gurney, BSN, RN, CWS, [name] RN, BSN Entered By: Betha Loa on 05/23/2023 10:24:51 -------------------------------------------------------------------------------- Wound Assessment Details Patient Name: Date of Service: Laura Muscat NNIE Santiago. 05/23/2023 10:00 A Santiago Medical Record Number: [id number] Patient Account Number: [id number] Date of  Birth/Sex: Treating RN: 11/04/1946 (77 y.o. Skip Mayer Primary Care Masayoshi Couzens: Marisue Ivan Other Clinician: Betha Loa Referring Sagan Maselli: Treating Castor Gittleman/Extender: Greta Doom in Treatment: 84 Wound Status Wound Number: 13 Primary Venous Leg Ulcer Etiology: Wound Location: Left, Lateral, Posterior Lower Leg Wound Status: Open Wounding Event: Not Known Comorbid Hypertension, Osteoarthritis, Received Chemotherapy, Date Acquired: 02/14/2023 History: Received Radiation Weeks Of Treatment: 7 Clustered Wound: No Photos Wound Measurements Length: (cm) 5 Width: (cm) 3 Depth: (cm) 0.2 Area: (cm) 11.781 Volume: (cm) 2.356 Laura Santiago, Laura Santiago ([id number]) % Reduction in Area: 37.5% % Reduction in Volume: 37.5% Epithelialization: None [id number] Page 13 of 14 Wound Description Classification: Full Thickness Without Exposed Suppor Exudate Amount: Medium Exudate Type: Serosanguineous Exudate Color: red, brown t Structures Foul Odor After Cleansing: No Slough/Fibrino Yes Wound Bed Granulation Amount: Medium (34-66%) Exposed Structure Granulation Quality: Red Fascia Exposed: No Necrotic Amount: Small (1-33%) Fat Layer (Subcutaneous Tissue) Exposed: Yes Necrotic Quality: Adherent Slough Tendon Exposed: No Muscle Exposed: No Joint Exposed: No Bone Exposed: No Treatment Notes Wound #13 (Lower Leg) Wound Laterality: Left, Lateral, Posterior Cleanser Peri-Wound Care Topical [place] Primary Dressing Aquacel Extra Hydrofiber Dressing, 4x5 (in/in) Secondary Dressing ABD Pad 5x9 (in/in) Discharge Instruction: Cover with ABD pad Secured With Compression Wrap 3-LAYER WRAP - Profore Lite LF 3 Multilayer Compression Bandaging System Discharge Instruction: Apply 3 multi-layer wrap as prescribed. Compression Stockings Add-Ons Electronic Signature(s) Signed: 05/23/2023 4:43:31 PM By: Betha Loa Signed:  05/25/2023 12:16:56 PM By: Elliot Gurney, BSN, RN, CWS, [name] RN, BSN Entered By: Betha Loa on 05/23/2023 10:25:14 -------------------------------------------------------------------------------- Vitals Details Patient Name: Date of Service: Laura Santiago, BO NNIE Santiago. 05/23/2023 10:00 A Santiago Medical Record Number: [id number] Patient Account Number: [id number] Date of Birth/Sex: Treating RN: 09-21-1946 (77 y.o. [name], [name] Primary Care Kairav Russomanno: Marisue Ivan Other Clinician: Betha Loa Referring Alexandrina Fiorini: Treating Tiani Stanbery/Extender: Greta Doom in Treatment: 9 Vital Signs Time Taken: 10:09 Temperature (F): 98.2 Height (in): 66 Pulse (bpm): 88 Weight (lbs): 153 Respiratory Rate (breaths/min): 18 Body Mass Index (BMI): 24.7 Blood Pressure (mmHg): 142/78 Laura Santiago, Laura Santiago ([id number]) [id number] Page 14 of 14 Reference Range: 80 - 120 mg / dl Electronic Signature(s) Signed: 05/23/2023 4:43:31 PM By: Betha Loa Entered By: Betha Loa on 05/23/2023 10:14:03

## 2023-05-26 NOTE — Progress Notes (Signed)
[name], [name] ([id number]) [id number] Page 1 of 18 Visit Report for 05/23/2023 Chief Complaint Document Details Patient Name: Date of Service: Laura Santiago. 05/23/2023 10:00 A M Medical Record Number: [id number] Patient Account Number: [id number] Date of Birth/Sex: Treating RN: [birth year] (77 y.o. Laura Santiago Primary Care Provider: Marisue Ivan Other Clinician: Betha Loa Referring Provider: Treating Provider/Extender: Greta Doom in Treatment: 92 Information Obtained from: Patient Chief Complaint Left lower extremity wound Right toe wounds Left upper lateral thigh wounds Electronic Signature(s) Signed: 05/23/2023 12:36:50 PM By: Geralyn Corwin DO Entered By: Geralyn Corwin on 05/23/2023 12:18:36 -------------------------------------------------------------------------------- Debridement Details Patient Name: Date of Service: Laura Santiago Laura M. 05/23/2023 10:00 A M Medical Record Number: [id number] Patient Account Number: [id number] Date of Birth/Sex: Treating RN: 1946/03/05 (77 y.o. Laura Santiago, Laura Santiago Primary Care Provider: Marisue Ivan Other Clinician: Betha Loa Referring Provider: Treating Provider/Extender: Greta Doom in Treatment: 98 Debridement Performed for Assessment: Wound #12 Left,Medial Ankle Performed By: Physician Geralyn Corwin, MD Debridement Type: Debridement Severity of Tissue Pre Debridement: Fat layer exposed Level of Consciousness (Pre-procedure): Awake and Alert Pre-procedure Verification/Time Out Yes - 10:35 Taken: Start Time: 10:35 Percent of Wound Bed Debrided: 100% T Area Debrided (cm): otal 2.67 Tissue and other material debrided: Viable, Non-Viable, Slough, Slough Level: Non-Viable Tissue Debridement Description: Selective/Open Wound Instrument: Curette Bleeding: Minimum Hemostasis Achieved: Pressure Response to Treatment:  Procedure was tolerated well Laura, Santiago ([id number]) [id number] Page 2 of 18 Level of Consciousness (Post- Awake and Alert procedure): Post Debridement Measurements of Total Wound Length: (cm) 2 Width: (cm) 1.7 Depth: (cm) 0.1 Volume: (cm) 0.267 Character of Wound/Ulcer Post Debridement: Stable Severity of Tissue Post Debridement: Fat layer exposed Post Procedure Diagnosis Same as Pre-procedure Electronic Signature(s) Signed: 05/23/2023 12:36:50 PM By: Geralyn Corwin DO Signed: 05/23/2023 4:43:31 PM By: Betha Loa Signed: 05/25/2023 12:16:56 PM By: Elliot Gurney, BSN, RN, CWS, [name] RN, BSN Entered By: Betha Loa on 05/23/2023 10:36:07 -------------------------------------------------------------------------------- Debridement Details Patient Name: Date of Service: Laura Santiago Laura M. 05/23/2023 10:00 A M Medical Record Number: [id number] Patient Account Number: [id number] Date of Birth/Sex: Treating RN: 08-Jan-1946 (77 y.o. Laura Santiago, Laura Santiago Primary Care Provider: Marisue Ivan Other Clinician: Betha Loa Referring Provider: Treating Provider/Extender: Greta Doom in Treatment: 98 Debridement Performed for Assessment: Wound #10 Left,Posterior Lower Leg Performed By: Physician Geralyn Corwin, MD Debridement Type: Debridement Severity of Tissue Pre Debridement: Fat layer exposed Level of Consciousness (Pre-procedure): Awake and Alert Pre-procedure Verification/Time Out Yes - 10:36 Taken: Start Time: 10:36 Percent of Wound Bed Debrided: 100% T Area Debrided (cm): otal 6.67 Tissue and other material debrided: Viable, Non-Viable, Slough, Slough Level: Non-Viable Tissue Debridement Description: Selective/Open Wound Instrument: Curette Bleeding: Minimum Hemostasis Achieved: Pressure Response to Treatment: Procedure was tolerated well Level of Consciousness (Post- Awake and Alert procedure): Post Debridement  Measurements of Total Wound Length: (cm) 2.5 Width: (cm) 3.4 Depth: (cm) 0.1 Volume: (cm) 0.668 Character of Wound/Ulcer Post Debridement: Stable Severity of Tissue Post Debridement: Fat layer exposed Post Procedure Diagnosis Same as Pre-procedure Electronic Signature(s) Laura, Santiago ([id number]) [id number] Page 3 of 18 Signed: 05/23/2023 12:36:50 PM By: Geralyn Corwin DO Signed: 05/23/2023 4:43:31 PM By: Betha Loa Signed: 05/25/2023 12:16:56 PM By: Elliot Gurney, BSN, RN, CWS, [name] RN, BSN Entered By: Betha Loa on 05/23/2023 10:36:49 -------------------------------------------------------------------------------- Debridement Details Patient Name: Date of Service: Laura Santiago Laura M. 05/23/2023 10:00 A M Medical Record Number: [id number] Patient Account Number: [id number] Date of  Birth/Sex: Treating RN: February 07, 1946 (77 y.o. Laura Santiago, Laura Santiago Primary Care Provider: Marisue Ivan Other Clinician: Betha Loa Referring Provider: Treating Provider/Extender: Greta Doom in Treatment: 98 Debridement Performed for Assessment: Wound #13 Left,Lateral,Posterior Lower Leg Performed By: Physician Geralyn Corwin, MD Debridement Type: Debridement Severity of Tissue Pre Debridement: Fat layer exposed Level of Consciousness (Pre-procedure): Awake and Alert Pre-procedure Verification/Time Out Yes - 10:37 Taken: Start Time: 10:37 Percent of Wound Bed Debrided: 100% T Area Debrided (cm): otal 13.35 Tissue and other material debrided: Viable, Non-Viable, Slough, Slough Level: Non-Viable Tissue Debridement Description: Selective/Open Wound Instrument: Curette Bleeding: Minimum Hemostasis Achieved: Pressure Response to Treatment: Procedure was tolerated well Level of Consciousness (Post- Awake and Alert procedure): Post Debridement Measurements of Total Wound Length: (cm) 5 Width: (cm) 3.4 Depth: (cm) 0.2 Volume: (cm)  2.67 Character of Wound/Ulcer Post Debridement: Stable Severity of Tissue Post Debridement: Fat layer exposed Post Procedure Diagnosis Same as Pre-procedure Electronic Signature(s) Signed: 05/23/2023 12:36:50 PM By: Geralyn Corwin DO Signed: 05/23/2023 4:43:31 PM By: Betha Loa Signed: 05/25/2023 12:16:56 PM By: Elliot Gurney, BSN, RN, CWS, [name] RN, BSN Entered By: Betha Loa on 05/23/2023 10:37:33 Laura Santiago ([id number]) [id number] Page 4 of 18 -------------------------------------------------------------------------------- HPI Details Patient Name: Date of Service: Laura Santiago. 05/23/2023 10:00 A M Medical Record Number: [id number] Patient Account Number: [id number] Date of Birth/Sex: Treating RN: 1946/11/29 (77 y.o. Laura Santiago Primary Care Provider: Marisue Ivan Other Clinician: Betha Loa Referring Provider: Treating Provider/Extender: Greta Doom in Treatment: 51 History of Present Illness HPI Description: Admission 7/27 Ms. Laura Santiago is a 77 year old female with a past medical history of ADHD, metastatic breast cancer, stage IV chronic kidney disease, history of DVT on Xarelto and chronic venous insufficiency that presents to the clinic for a chronic left lower extremity wound. She recently moved to [facility] 4 days ago. She was being followed by wound care center in [place]. She reports a 10-year history of wounds to her left lower extremity that eventually do heal with debridement and compression therapy. She states that the current wound reopened 4 months ago and she is using Vaseline and Coban. She denies signs of infection. 8/3; patient presents for 1 week follow-up. She reports no issues or complaints today. She states she had vascular studies done in the last week. She denies signs of infection. She brought her little service dog with her today. 8/17; patient presents for  follow-up. She has missed her last clinic appointment. She states she took the wrap off and attempted to rewrap her leg. She is having difficulty with transportation. She has her service dog with her today. Overall she feels well and reports improvement in wound healing. She denies signs of infection. She reports owning an old Velcro wrap compression and has this at her living facility 9/14; patient presents for follow-up. Patient states that over the past 2 to 3 weeks she developed toe wounds to her right foot. She attributes this to tight fitting shoes. She subsequently developed cellulitis in the right leg and has been treated by doxycycline by her oncologist. She reports improvement in symptoms however continues to have some redness and swelling to this leg. T the left lower extremity patient has been having her wraps changed with home health twice weekly. She states that the [facility] is not helping control o the drainage. Other than that she has no issues or complaints today. She denies signs of infection to the left lower extremity.  9/21; patient presents for follow-up. She reports seeing infectious disease for her cellulitis. She reports no further management. She has home health that changes the wraps twice weekly. She has no issues or complaints today. She denies signs of infection. 10/5; patient presents for follow-up. She has no issues or complaints today. She denies signs of infection. She states that the right great toe has not been dressed by home health. 10/12; patient presents for follow-up. She has no issues or complaints today. She reports improvement in her wound healing. She has been using silver alginate to the right great toe wound. She denies signs of infection. 10/26; patient presents for follow-up. Home health did not have sorbact so they continued to use Hydrofera Blue under the wrap. She has been using silver alginate to the great toe wound however she did not have a  dressing in place today. She currently denies signs of infection. 11/2; patient presents for follow-up. She has been using sorb act under the compression wrap. She reports using silver alginate to the toe wound again she does not have a dressing in place. She currently denies signs of infection. 11/23; patient presents for follow-up. Unfortunately she has missed her last 2 clinic appointments. She was last seen 3 weeks ago. She did her own compression wrap with Kerlix and Coban yesterday after seeing vein and vascular. She has not been dressing her right great toe wound. She currently denies signs of infection. 11/30; patient presents for 1 week follow-up. She states she changed her dressing last week prior to home health and use sorb act with Dakin's and Hydrofera Blue. Home health has changed the dressing as well and they have been using sorbact. T oday she reports increased redness to her right lower extremity. She has a history of cellulitis to this leg. She has been using silver alginate to the right great toe. Unfortunately she had an episode of diarrhea prior to coming in and had feces all over the right leg and to the wrap of her left leg. 12/7; patient presents for 1 week follow-up. She states that home health did not come out to change the dressing and she took it off yesterday. It is unclear if she is dressing the right toe wound. She denies signs of infection. 12/14; patient presents for 1 week follow-up. She has no issues or complaints today. 12/21; patient presents for follow-up. She has no issues or complaints today. She denies signs of infection. 12/28/2021; patient presents for follow-up. She was hospitalized for sepsis secondary to right lower extremity cellulitis On 12/23. She states she is currently at a SNF. She states that she was started on doxycycline this morning for her right great toe swelling and redness. She is not sure what dressings have been done to her left lower  extremity for the past 3 weeks. She says its been mainly gauze with an Ace wrap. 1/25; patient presents for follow-up. She is still residing in a skilled nursing facility. She reports mild pain to the left lower extremity wound bed. She states she is going to see a podiatrist soon. 2/8; patient presents for follow-up. She has moved back to her residential community from her skilled nursing facility. She has no issues or complaints today. She denies signs of systemic infections. 2/15; patient presents for follow-up. He has no issues or complaints today. She denies systemic signs of infection. Laura, BESSELMAN ([id number]) [id number] Page 5 of 18 2/22; patient presents for follow-up. She has no issues or complaints today.  She denies signs of infection. 3/1; patient presents for follow-up. She states that home health came out the day after she was seen in our clinic and yesterday to do the wrap change. She denies signs of infection. She reports excoriated skin on the ankle. 3/8; patient presents for follow-up. She has no issues or complaints today. She denies signs of infection. 3/15; patient presents for follow-up. Home health has been coming out to change the dressings. She reports more tenderness to the wound site. She denies purulent drainage, increased warmth or erythema to the area. 4/5; patient presents for follow-up. She has missed her last 2 clinic appointments. I have not seen her in 3 weeks. She was recently hospitalized for altered mental status. She was involuntarily committed. She was evaluated by psychiatry and deemed to have competency. There was no specific cause of her altered mental status. It was concluded that her physical and mental health were declining due to her chronic medical conditions. Currently home health has been coming out for dressing changes. Patient has also been doing her own dressing changes. She reports more skin breakdown to the  periwound and now has a new wound. She denies fever/chills. She reports continued tenderness to the wound site. 4/12; patient with significant venous insufficiency and a large wound on her left lower leg taking up about 80% of the circumference of her lower leg. Cultures of this grew MRSA and Pseudomonas. She had completed a course of ciprofloxacin now is starting doxycycline. She has been using Dakin's wet-to-dry and a Tubigrip. She has home health twice a week and we change it once. 4/19; patient presents for follow-up. She completed her course of doxycycline. She has been using Dakin's wet-to-dry dressing and Tubigrip. Home health changes the dressing twice weekly. Currently she has no issues or complaints. 4/26; patient presents for follow-up. At last clinic visit orders for home health were Iodosorb under compression therapy. Unfortunately they did not have the dressing and have been using Dakin's and gentamicin under the wrap. Patient currently denies signs of infection. She has no issues or complaints today. 5/3; patient presents for follow-up. Again Iodosorb has not been used under the compression therapy when home health comes out to change the wrap and dressing. They have been using Sorbact. It is unclear why this is happening since we send orders weekly to the agency. She denies signs of infection. Patient has not purchased the [place] antibiotics. We reached out to the company and they said they have been trying to contact her on a regular basis. We gave the patient the number to call to order the medication. 5/10; patient presents for follow-up. She has no issues or complaints today. Again home health has not been using Iodosorb. Mepilex was on the wound bed. No other dressings noted. She brought in her Keystone antibiotics. She denies signs of infection. 5/17; patient presents for follow-up. Home health has come out twice since she was last seen. Joint well she has been using Keystone  antibiotic with Sorbact under the compression wrap. She has no issues or complaints today. She denies signs of infection. 5/24; patient presents for follow-up. We have been using Keystone antibiotics with Sorbact under compression therapy. She is tolerating the treatment well. She is reporting improvement in wound healing. She denies signs of infection. 5/31; patient presents for follow-up. We continue to do [facility] antibiotics with Sorbact under compression therapy. She continues to report improvement in wound healing. Home health comes out and changes the dressing once weekly.  05-17-2022 upon evaluation today patient appears to be doing better in regard to her wound especially compared to the last time I saw her. Fortunately I do think that she is seeing improvements. With that being said I do believe that she may be benefit from sharp debridement today to clear away some of the necrotic debris I discussed that with her as well. She is an amendable to that plan. Otherwise she is very pleased with how the [facility] Eche[facility]ia is doing for her. 6/14; patient presents for follow-up. We have been using Keystone antibiotic with Sorbact and absorbent dressings under 3 layer compression. She has no issues or complaints today. She reports improvement in wound healing. She denies signs of infection. 6/21; patient presents for follow-up. We are continuing with [facility] antibiotic and Sorbact under 3 layer compression. Patient has no complaints. Continued wound healing is happening. She denies signs of infection. 6/28; patient presents for follow-up. We have been using Keystone antibiotic with Sorbact under 3 layer compression. Usually home health comes out and changes the dressing twice a week. Unfortunately they did not go out to change the dressing. It is unclear why. Patient did not call them. She currently denies signs of infection. 7/5; patient presents for follow-up. We have been using Keystone antibiotic with  calcium alginate under 3 layer compression. She reports improvement in wound healing. She denies signs of infection. Home health has come out to do dressing changes twice this past week. 7/12; patient presents for follow-up. We have been using Keystone antibiotic with calcium alginate under 3 layer compression. Patient states that home health came out once last week to change the dressing. She reports improvement in wound healing. She currently denies signs of infection. 7/19; patient presents for follow-up. We have been using Keystone antibiotic with calcium alginate under 3 layer compression. Home health came out once last week to change the dressing. She has no issues or complaints today. She denies signs of infection. 8/2; patient presents for follow-up. We have been using Keystone antibiotic with calcium alginate under 3 layer compression. Unfortunately she missed her appointment last week and home health did not come out to do dressing changes. Patient currently denies signs of infection. 8/9; patient presents for follow-up. We have been using Keystone with calcium alginate under 3 layer compression. She states that home health came out once last week. She currently denies signs of infection. Her wrap was completely wet. She states she was cleaning the top of the leg and water soaked down into the wrap. 8/16; patient presents for follow-up. We have been using Keystone with calcium alginate under 3 layer compression. She states that home health came out twice last week. She has no issues or complaints today. 8/23; patient presents for follow-up. He has been using Keystone with calcium alginate under 3 layer compression. Home health came out twice last week. She denies signs of infection. 8/30; patient presents for follow-up. We have been using Keystone with calcium alginate under 3 layer compression. Home health came out once last week to change the dressing. Patient reports improvement in wound  healing. She states she is almost done with her chemotherapy infusions and has 1 more left. 9/13; patient presents for follow-up. She has lost the capsules to her [facility] antibiotic which I believe is the vancomycin pills. She has her Zosyn powder today. We have been using Keystone antibiotic ointment with calcium alginate under 3 layer compression. She is concerned about systemic infection however her vitals are stable and there is  no surrounding soft tissue infection. She would like to remain a patient in our wound care center however would like a second opinion for her wound care at another facility. She asked to be referred to [facility] wound care center. 9/20; patient presents for follow-up. She found her vancomycin capsules and brought in her complete Keystone antibiotic ointment set today. Unfortunately she has developed skin breakdown and Erythema to the right lower extremity With increased swelling. She states she went to a pow wow Over the weekend and was on her feet for extended periods of time. She saw her oncologist yesterday who prescribed her doxycycline for her right lower extremity erythema. 9/27; patient presents for follow-up. We have been using Keystone antibiotic with Aquacel under 3 layer compression to the lower extremities bilaterally. When Laura, Santiago ([id number]) [id number] Page 6 of 18 home health came and changed the wrap she secretly put coffee into the spray mix along with [facility] antibiotic on her leg thinking the acidic component would better activate the zoysn (sonething she discussed with her microbiologist brother). She has reported improvement in wound healing. 10/4; patient presents for follow-up. She has no issues or complaints today. We have been doing Aquacel and keystone under 3 layer compression to the lower extremities bilaterally. This morning she took the right lower extremity wrap off as it was uncomfortable. She has no open  wounds to this leg. 10/11; patient presents for follow-up. We have been doing Aquacel with Keystone antibiotic ointment under 3 layer compression to the left lower extremity. She developed a small blister to the anterior aspect of the left leg noticed when the wrap was taken off on intake. She currently denies signs of infection. 10/18; patient presents for follow-up. We have been doing Aquacel with Keystone antibiotic ointment under 3 layer compression to the left lower extremity. There has been continued improvement in wound healing. She denies signs of infection. 10/25; patient arrives for treatment of venous insufficiency ulcers on her left lower leg both lateral and medial are remanence of apparently a circumferential wound. Much improved. We are using topicals Keystone and Aquacel Ag under 3 layer compression we continue to make good progress. The patient talk to me at some length with regards to different things she has on her forehead and her Peri orbital area for which she is apparently applying [place]. She feels that what ever we are treating on her wounds is a more systemic problem. I really was not able to get a handle on what she is talking about however I did caution her not to put the [place] in her eyes. 11/1; her wounds continue to improve she is using Keystone and Aquacel Ag G under 3 layer compression. Our intake nurse notes erythema and edema in the right leg. The patient has a litany of concerns with regards to a rash on her forehead or ears and other systemic complaints. She has an appointment with dermatology on November 11 11/8; patient presents for follow-up. We have been using Keystone and Aquacel under 4-layer compression. She has no issues or complaints today. She reports improvement in wound healing. 11/15; patient presents for follow-up. We have been using Aquacel with Keystone antibiotic under 3 layer compression. Patient continues improvement in wound  healing. 12/6; patient presents for follow-up. We have been using Aquacel with Keystone antibiotic ointment under 3 layer compression. Wounds appear well-healing. 12/13; patient presents for follow-up. We have been using Aquacel with Keystone antibiotic under 3 layer compression. She has no issues or  complaints today. 12/27 left lateral medial ankle. Superficial wounds remain there is significantly improved we are using Keystone backed with Zetuvit under 4-layer compression 1/3; patient presents for follow-up. Her wounds appear well-healing. We have been using Aquacel Ag with Keystone antibiotic ointment under compression therapy. This should be a 3 layer compression. 1/17; patient presents for follow-up. Her wounds on her left lower extremity are well-healing. We are using Aquacel Ag and Keystone antibiotic ointment under compression therapy. She missed her last clinic appointment. She states that the wrap has been changed twice weekly since she was last seen. Unfortunately she has developed increased warmth and redness to the right lower extremity consistent with cellulitis. She states this started a few days ago. 1/24; patient presents for follow-up. Her wounds on the left lower extremity are well-healing with Aquacel and Keystone antibiotic ointment under compression therapy. I prescribed Keflex at last clinic visit for cellulitis of the right lower extremity. Unfortunately this has not resolved. She did not follow-up with her PCP for contact our office about her symptoms. 1/31; patient presents for follow-up. We have been using Aquacel and Keystone antibiotic ointment under compression therapy to the left lower extremity. Wounds appear well-healing. She has been taking clindamycin for the past week. Her symptoms have improved slightly with a decrease in erythema and warmth to the right lower extremity. She says her pain level has improved. However Symptoms have not completely resolved. She denies  fever/chills, nausea/vomiting. 2/7; patient presents for follow-up. We have been using Aquacel Ag with Keystone antibiotic ointment under compression therapy to the left lower extremity. For the past week home health has not been using Keystone antibiotic ointment. She continues to take clindamycin. Her symptoms have improved greatly with the decrease in erythema and warmth to the right lower extremity. She denies systemic signs of infection. She has an area of skin breakdown to the right anterior leg. 2/14; Patient presents for follow-up. She has been using Hydrofera Blue to the right anterior leg under Tubigrip. It looks like Hydrofera Blue is also being used with Keystone to the left lower extremity under compression therapy. Order is for Aqualcell Ag. Overall wounds appear well healing. She has no issues or complaints. 2/21; patient presents for follow-up. We have been using Hydrofera Blue under 3 layer compression to the lower extremities bilaterally. Her right lower extremity wounds have healed. She has a small open wound remaining to her left lateral leg. 2/28; patient presents for follow-up. We have been using Hydrofera Blue under 3 layer compression. Home health has changed the dressing to the left lower extremity however started the wrap at the ankle. 3/6; patient presents for follow-up. We have been using PolyMem with antibiotic ointment under 3 layer compression to the left lower extremity. She has no issues or complaints today. 3/12; patient presents for follow-up. We have been using Aquacel Ag under 3 layer compression to the left lower extremity. Her wound is healed. She has juxta lite compression wraps at home. Readmission: 03-13-2023 upon evaluation today patient appears to be doing poorly in regard to her lower extremities. Specifically it is the left lower extremity at this point that is causing her problems she has multiple wounds open. With that being said she does not sound  like she has been wearing her compression stockings on a regular basis she has previously seen Dr. Mikey Bussing. With that being said upon evaluation today she tells me that since the wounds have reopened that she has been having a lot of discomfort  she also tells me that she went back to using some of the dressings that she had leftover at home she does not know exactly what everything was. Nonetheless she also tells me that she has made an appointment with a dermatologist in town and has to see them tomorrow therefore she is not going to allow [place] to wrap her legs today since "that did not work anyway". With that being said obviously she did improve last time we got her wound healed we order her juxta lite compression wraps again it does not sound like she has been using those appropriately at home. 4/10; patient presents for follow-up. Unfortunately she reopened last week and was seen by [facility]. At that time the patient declined in office wraps. She states she is wearing her juxta lite compression wraps however she did not have these on today. 4/17; patient presents for follow-up. At last clinic visit a PCR culture was done that grew Staph aureus and Enterobacter cola CA and Enterococcus bacillus. Keystone antibiotic ointment was ordered. She has not received this yet. We have been using antibiotic ointment with Hydrofera Blue under compression therapy. There is been improvement in her wound healing. She has no issues or complaints today. 4/24; patient presents for follow-up. She received Keystone antibiotic ointment and this was started 3 days ago when home health came to change the wrap. She has no issues or complaints today. 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 She has  been using Keystone Aquacel Ag under compression. She tells me she is recently started on chemotherapy for metastatic breast cancer with "bone mets" I am not exactly sure at this point what drugs she is receiving. 5/22; patient presents for follow-up. We have been using Keystone antibiotic ointment and Aquacel Ag under compression therapy. Wounds are improved. 5/29; patient presents for follow-up. We have been using Aquacel with Keystone antibiotic ointment under 3 layer compression. Patient has no issues or complaints today. Overall wounds appear well-healing. 6/5; we have been following this patient for wounds on the left leg. We have been using Keystone and Aquacel under 3 layer compression on the left. On the right leg she is supposed to be using a juxta lite She comes in today with the left leg wound stable to improved however the right leg was very swollen with threatening ulceration and weeping edema fluid. 6/12; patient presents for follow-up. We have been using Keystone and Aquacel under 3 layer compression on the left. She had a compression wrap placed on the right at last clinic visit due to threatening ulcerations and weeping however home health did not place a new wrap. She had no compression garment on today. She has scabs to previous open weeping sites. Electronic Signature(s) Signed: 05/23/2023 12:36:50 PM By: Geralyn Corwin DO Entered By: Geralyn Corwin on 05/23/2023 12:20:07 -------------------------------------------------------------------------------- Physical Exam Details Patient Name: Date of Service: Laura Santiago Laura M. 05/23/2023 10:00 A M Medical Record Number: [id number] Patient Account Number: [id number] Date of Birth/Sex: Treating RN: [birth year] (77 y.o. Laura Santiago Primary Care Provider: Marisue Ivan Other Clinician: Betha Loa Referring Provider: Treating Provider/Extender: Greta Doom in Treatment:  7 Constitutional . Cardiovascular . Psychiatric . Notes Left lower extremity: T the distal aspect there are are several scattered open wounds with granulation tissue and nonviable tissue.  2+ pitting edema to the o knee. No signs of surrounding infection including increased warmth, erythema or purulent drainage. Right leg: 2+ pitting edema to the knee without weeping or open wounds. No signs of infection. Electronic Signature(s) Signed: 05/23/2023 12:36:50 PM By: Geralyn Corwin DO Entered By: Geralyn Corwin on 05/23/2023 12:20:51 Laura Santiago ([id number]) [id number] Page 8 of 18 -------------------------------------------------------------------------------- Physician Orders Details Patient Name: Date of Service: Laura Santiago. 05/23/2023 10:00 A M Medical Record Number: [id number] Patient Account Number: [id number] Date of Birth/Sex: Treating RN: 22-Oct-1946 (77 y.o. Laura Santiago Primary Care Provider: Marisue Ivan Other Clinician: Betha Loa Referring Provider: Treating Provider/Extender: Greta Doom in Treatment: 14 Verbal / Phone Orders: Yes Clinician: Huel Coventry Read Back and Verified: Yes Diagnosis Coding Follow-up Appointments Return Appointment in 1 week. Nurse Visit as needed Home Health Home Health Company: [facility] [phone] **Please direct any NON-WOUND related issues/requests for orders to patient's Primary Care Physician. **If current dressing causes regression in wound condition, may D/C ordered dressing product/s and apply Normal Saline Moist Dressing daily until next Wound Healing Center or Other MD appointment. **Notify Wound Healing Center of regression in wound condition at [phone]. Bathing/ Shower/ Hygiene May shower with wound dressing protected with water repellent cover or cast protector. Edema Control - Lymphedema / Segmental Compressive Device /  Other Optional: One layer of unna paste to top of compression wrap (to act as an anchor). Tubigrip single layer applied. - Tubi D right leg Elevate, Exercise Daily and A void Standing for Long Periods of Time. Elevate legs to the level of the heart and pump ankles as often as possible Elevate leg(s) parallel to the floor when sitting. DO YOUR BEST to sleep in the bed at night. DO NOT sleep in your recliner. Long hours of sitting in a recliner leads to swelling of the legs and/or potential wounds on your backside. Additional Orders / Instructions Follow Nutritious Diet and Increase Protein Intake Wound Treatment Wound #10 - Lower Leg Wound Laterality: Left, Posterior Topical: Keystone 3 x Per Week/30 Days Prim Dressing: Aquacel Extra Hydrofiber Dressing, 4x5 (in/in) ary 3 x Per Week/30 Days Secondary Dressing: ABD Pad 5x9 (in/in) 3 x Per Week/30 Days Discharge Instructions: Cover with ABD pad Compression Wrap: 3-LAYER WRAP - Profore Lite LF 3 Multilayer Compression Bandaging System 3 x Per Week/30 Days Discharge Instructions: Apply 3 multi-layer wrap as prescribed. Wound #11 - Lower Leg Wound Laterality: Left, Lateral Topical: Keystone 3 x Per Week/30 Days Prim Dressing: Aquacel Extra Hydrofiber Dressing, 4x5 (in/in) ary 3 x Per Week/30 Days Secondary Dressing: ABD Pad 5x9 (in/in) 3 x Per Week/30 Days Discharge Instructions: Cover with ABD pad Compression Wrap: 3-LAYER WRAP - Profore Lite LF 3 Multilayer Compression Bandaging System 3 x Per Week/30 Days Discharge Instructions: Apply 3 multi-layer wrap as prescribed. Wound #12 - Ankle Wound Laterality: Left, Medial Topical: Keystone 3 x Per Week/30 Days Prim Dressing: Aquacel Extra Hydrofiber Dressing, 4x5 (in/in) ary 3 x Per Week/30 Days Laura, Santiago ([id number]) [id number] Page 9 of 18 Secondary Dressing: ABD Pad 5x9 (in/in) 3 x Per Week/30 Days Discharge Instructions: Cover with ABD pad Compression  Wrap: 3-LAYER WRAP - Profore Lite LF 3 Multilayer Compression Bandaging System 3 x Per Week/30 Days Discharge Instructions: Apply 3 multi-layer wrap as prescribed. Wound #13 - Lower Leg Wound Laterality: Left, Lateral, Posterior Topical: Keystone 3 x Per Week/30 Days Prim Dressing: Aquacel Extra Hydrofiber Dressing, 4x5 (in/in) ary 3 x Per Week/30  Days Secondary Dressing: ABD Pad 5x9 (in/in) 3 x Per Week/30 Days Discharge Instructions: Cover with ABD pad Compression Wrap: 3-LAYER WRAP - Profore Lite LF 3 Multilayer Compression Bandaging System 3 x Per Week/30 Days Discharge Instructions: Apply 3 multi-layer wrap as prescribed. Electronic Signature(s) Signed: 05/23/2023 12:36:50 PM By: Geralyn Corwin DO Entered By: Geralyn Corwin on 05/23/2023 12:25:57 -------------------------------------------------------------------------------- Problem List Details Patient Name: Date of Service: Laura Santiago, Laura Peters Laura M. 05/23/2023 10:00 A M Medical Record Number: [id number] Patient Account Number: [id number] Date of Birth/Sex: Treating RN: 1946-09-17 (77 y.o. Laura Santiago Primary Care Provider: Marisue Ivan Other Clinician: Betha Loa Referring Provider: Treating Provider/Extender: Greta Doom in Treatment: 47 Active Problems ICD-10 Encounter Code Description Active Date MDM Diagnosis [id number] Non-pressure chronic ulcer of other part of left lower leg with fat layer exposed11/23/2022 No Yes 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 11/02/2021 No Yes Z79.01 Long term (current) use of anticoagulants 07/06/2021 No Yes I10 Essential (primary) hypertension 07/06/2021 No Yes 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 07/06/2021 No Yes Inactive Problems ICD-10 SYNDEY, MERENDINO ([id number]) [id number] Page 10 of 18 Code Description Active Date Inactive Date S81.802A Unspecified open wound,  left lower leg, initial encounter 07/06/2021 07/06/2021 S91.101A Unspecified open wound of right great toe without damage to nail, initial encounter 08/24/2021 08/24/2021 S91.104A Unspecified open wound of right lesser toe(s) without damage to nail, initial encounter 08/24/2021 08/24/2021 Resolved Problems ICD-10 Code Description Active Date Resolved Date S91.104D Unspecified open wound of right lesser toe(s) without damage to nail, subsequent 08/31/2021 08/31/2021 encounter S91.201D Unspecified open wound of right great toe with damage to nail, subsequent encounter 08/31/2021 08/31/2021 I87.311 Chronic venous hypertension (idiopathic) with ulcer of right lower extremity 08/30/2022 08/30/2022 L03.115 Cellulitis of right lower limb 12/27/2022 12/27/2022 S81.801A Unspecified open wound, right lower leg, initial encounter 01/17/2023 01/17/2023 Electronic Signature(s) Signed: 05/23/2023 12:36:50 PM By: Geralyn Corwin DO Entered By: Geralyn Corwin on 05/23/2023 12:01:29 -------------------------------------------------------------------------------- Progress Note Details Patient Name: Date of Service: Laura Santiago Laura M. 05/23/2023 10:00 A M Medical Record Number: [id number] Patient Account Number: [id number] Date of Birth/Sex: Treating RN: [birth year] (77 y.o. Laura Santiago Primary Care Provider: Marisue Ivan Other Clinician: Betha Loa Referring Provider: Treating Provider/Extender: Greta Doom in Treatment: 90 Subjective Chief Complaint Information obtained from Patient Left lower extremity wound Right toe wounds Left upper lateral thigh wounds History of Present Illness (HPI) Admission 7/27 Ms. Yovonda Soja is a 77 year old female with a past medical history of ADHD, metastatic breast cancer, stage IV chronic kidney disease, history of DVT on Xarelto and chronic venous insufficiency that presents to the clinic for a chronic left lower extremity wound. She  recently moved to [facility] 4 days ago. She was being followed by wound care center in [place]. She reports a 10-year history of wounds to her left lower extremity that eventually do heal with debridement and compression therapy. She states that the current wound reopened 4 months ago and she is using Vaseline and Coban. She denies signs of infection. 8/3; patient presents for 1 week follow-up. She reports no issues or complaints today. She states she had vascular studies done in the last week. She denies signs of infection. She brought her little service dog with her today. ADIANEZ, SIRACUSA ([id number]) [id number] Page 11 of 18 8/17; patient presents for follow-up. She has missed her last clinic appointment. She states she took the wrap off and attempted to rewrap  her leg. She is having difficulty with transportation. She has her service dog with her today. Overall she feels well and reports improvement in wound healing. She denies signs of infection. She reports owning an old Velcro wrap compression and has this at her living facility 9/14; patient presents for follow-up. Patient states that over the past 2 to 3 weeks she developed toe wounds to her right foot. She attributes this to tight fitting shoes. She subsequently developed cellulitis in the right leg and has been treated by doxycycline by her oncologist. She reports improvement in symptoms however continues to have some redness and swelling to this leg. T the left lower extremity patient has been having her wraps changed with home health twice weekly. She states that the [facility] is not helping control o the drainage. Other than that she has no issues or complaints today. She denies signs of infection to the left lower extremity. 9/21; patient presents for follow-up. She reports seeing infectious disease for her cellulitis. She reports no further management. She has home health that changes  the wraps twice weekly. She has no issues or complaints today. She denies signs of infection. 10/5; patient presents for follow-up. She has no issues or complaints today. She denies signs of infection. She states that the right great toe has not been dressed by home health. 10/12; patient presents for follow-up. She has no issues or complaints today. She reports improvement in her wound healing. She has been using silver alginate to the right great toe wound. She denies signs of infection. 10/26; patient presents for follow-up. Home health did not have sorbact so they continued to use Hydrofera Blue under the wrap. She has been using silver alginate to the great toe wound however she did not have a dressing in place today. She currently denies signs of infection. 11/2; patient presents for follow-up. She has been using sorb act under the compression wrap. She reports using silver alginate to the toe wound again she does not have a dressing in place. She currently denies signs of infection. 11/23; patient presents for follow-up. Unfortunately she has missed her last 2 clinic appointments. She was last seen 3 weeks ago. She did her own compression wrap with Kerlix and Coban yesterday after seeing vein and vascular. She has not been dressing her right great toe wound. She currently denies signs of infection. 11/30; patient presents for 1 week follow-up. She states she changed her dressing last week prior to home health and use sorb act with Dakin's and Hydrofera Blue. Home health has changed the dressing as well and they have been using sorbact. T oday she reports increased redness to her right lower extremity. She has a history of cellulitis to this leg. She has been using silver alginate to the right great toe. Unfortunately she had an episode of diarrhea prior to coming in and had feces all over the right leg and to the wrap of her left leg. 12/7; patient presents for 1 week follow-up. She states  that home health did not come out to change the dressing and she took it off yesterday. It is unclear if she is dressing the right toe wound. She denies signs of infection. 12/14; patient presents for 1 week follow-up. She has no issues or complaints today. 12/21; patient presents for follow-up. She has no issues or complaints today. She denies signs of infection. 12/28/2021; patient presents for follow-up. She was hospitalized for sepsis secondary to right lower extremity cellulitis On 12/23. She  states she is currently at a SNF. She states that she was started on doxycycline this morning for her right great toe swelling and redness. She is not sure what dressings have been done to her left lower extremity for the past 3 weeks. She says its been mainly gauze with an Ace wrap. 1/25; patient presents for follow-up. She is still residing in a skilled nursing facility. She reports mild pain to the left lower extremity wound bed. She states she is going to see a podiatrist soon. 2/8; patient presents for follow-up. She has moved back to her residential community from her skilled nursing facility. She has no issues or complaints today. She denies signs of systemic infections. 2/15; patient presents for follow-up. He has no issues or complaints today. She denies systemic signs of infection. 2/22; patient presents for follow-up. She has no issues or complaints today. She denies signs of infection. 3/1; patient presents for follow-up. She states that home health came out the day after she was seen in our clinic and yesterday to do the wrap change. She denies signs of infection. She reports excoriated skin on the ankle. 3/8; patient presents for follow-up. She has no issues or complaints today. She denies signs of infection. 3/15; patient presents for follow-up. Home health has been coming out to change the dressings. She reports more tenderness to the wound site. She denies purulent drainage, increased  warmth or erythema to the area. 4/5; patient presents for follow-up. She has missed her last 2 clinic appointments. I have not seen her in 3 weeks. She was recently hospitalized for altered mental status. She was involuntarily committed. She was evaluated by psychiatry and deemed to have competency. There was no specific cause of her altered mental status. It was concluded that her physical and mental health were declining due to her chronic medical conditions. Currently home health has been coming out for dressing changes. Patient has also been doing her own dressing changes. She reports more skin breakdown to the periwound and now has a new wound. She denies fever/chills. She reports continued tenderness to the wound site. 4/12; patient with significant venous insufficiency and a large wound on her left lower leg taking up about 80% of the circumference of her lower leg. Cultures of this grew MRSA and Pseudomonas. She had completed a course of ciprofloxacin now is starting doxycycline. She has been using Dakin's wet-to-dry and a Tubigrip. She has home health twice a week and we change it once. 4/19; patient presents for follow-up. She completed her course of doxycycline. She has been using Dakin's wet-to-dry dressing and Tubigrip. Home health changes the dressing twice weekly. Currently she has no issues or complaints. 4/26; patient presents for follow-up. At last clinic visit orders for home health were Iodosorb under compression therapy. Unfortunately they did not have the dressing and have been using Dakin's and gentamicin under the wrap. Patient currently denies signs of infection. She has no issues or complaints today. 5/3; patient presents for follow-up. Again Iodosorb has not been used under the compression therapy when home health comes out to change the wrap and dressing. They have been using Sorbact. It is unclear why this is happening since we send orders weekly to the agency. She denies  signs of infection. Patient has not purchased the Anna antibiotics. We reached out to the company and they said they have been trying to contact her on a regular basis. We gave the patient the number to call to order the medication. 5/10;  patient presents for follow-up. She has no issues or complaints today. Again home health has not been using Iodosorb. Mepilex was on the wound bed. No other dressings noted. She brought in her Keystone antibiotics. She denies signs of infection. 5/17; patient presents for follow-up. Home health has come out twice since she was last seen. Joint well she has been using Keystone antibiotic with Sorbact under the compression wrap. She has no issues or complaints today. She denies signs of infection. 5/24; patient presents for follow-up. We have been using Keystone antibiotics with Sorbact under compression therapy. She is tolerating the treatment well. She DANIQUA, ROSSELLI ([id number]) [id number] Page 12 of 18 is reporting improvement in wound healing. She denies signs of infection. 5/31; patient presents for follow-up. We continue to do [facility] antibiotics with Sorbact under compression therapy. She continues to report improvement in wound healing. Home health comes out and changes the dressing once weekly. 05-17-2022 upon evaluation today patient appears to be doing better in regard to her wound especially compared to the last time I saw her. Fortunately I do think that she is seeing improvements. With that being said I do believe that she may be benefit from sharp debridement today to clear away some of the necrotic debris I discussed that with her as well. She is an amendable to that plan. Otherwise she is very pleased with how the [facility] is doing for her. 6/14; patient presents for follow-up. We have been using Keystone antibiotic with Sorbact and absorbent dressings under 3 layer compression. She has no issues or complaints today.  She reports improvement in wound healing. She denies signs of infection. 6/21; patient presents for follow-up. We are continuing with [facility] antibiotic and Sorbact under 3 layer compression. Patient has no complaints. Continued wound healing is happening. She denies signs of infection. 6/28; patient presents for follow-up. We have been using Keystone antibiotic with Sorbact under 3 layer compression. Usually home health comes out and changes the dressing twice a week. Unfortunately they did not go out to change the dressing. It is unclear why. Patient did not call them. She currently denies signs of infection. 7/5; patient presents for follow-up. We have been using Keystone antibiotic with calcium alginate under 3 layer compression. She reports improvement in wound healing. She denies signs of infection. Home health has come out to do dressing changes twice this past week. 7/12; patient presents for follow-up. We have been using Keystone antibiotic with calcium alginate under 3 layer compression. Patient states that home health came out once last week to change the dressing. She reports improvement in wound healing. She currently denies signs of infection. 7/19; patient presents for follow-up. We have been using Keystone antibiotic with calcium alginate under 3 layer compression. Home health came out once last week to change the dressing. She has no issues or complaints today. She denies signs of infection. 8/2; patient presents for follow-up. We have been using Keystone antibiotic with calcium alginate under 3 layer compression. Unfortunately she missed her appointment last week and home health did not come out to do dressing changes. Patient currently denies signs of infection. 8/9; patient presents for follow-up. We have been using Keystone with calcium alginate under 3 layer compression. She states that home health came out once last week. She currently denies signs of infection. Her wrap was  completely wet. She states she was cleaning the top of the leg and water soaked down into the wrap. 8/16; patient presents for follow-up. We have been  using Keystone with calcium alginate under 3 layer compression. She states that home health came out twice last week. She has no issues or complaints today. 8/23; patient presents for follow-up. He has been using Keystone with calcium alginate under 3 layer compression. Home health came out twice last week. She denies signs of infection. 8/30; patient presents for follow-up. We have been using Keystone with calcium alginate under 3 layer compression. Home health came out once last week to change the dressing. Patient reports improvement in wound healing. She states she is almost done with her chemotherapy infusions and has 1 more left. 9/13; patient presents for follow-up. She has lost the capsules to her [facility] antibiotic which I believe is the vancomycin pills. She has her Zosyn powder today. We have been using Keystone antibiotic ointment with calcium alginate under 3 layer compression. She is concerned about systemic infection however her vitals are stable and there is no surrounding soft tissue infection. She would like to remain a patient in our wound care center however would like a second opinion for her wound care at another facility. She asked to be referred to [facility] wound care center. 9/20; patient presents for follow-up. She found her vancomycin capsules and brought in her complete Keystone antibiotic ointment set today. Unfortunately she has developed skin breakdown and Erythema to the right lower extremity With increased swelling. She states she went to a pow wow Over the weekend and was on her feet for extended periods of time. She saw her oncologist yesterday who prescribed her doxycycline for her right lower extremity erythema. 9/27; patient presents for follow-up. We have been using Keystone antibiotic with Aquacel under 3 layer  compression to the lower extremities bilaterally. When home health came and changed the wrap she secretly put coffee into the spray mix along with [facility] antibiotic on her leg thinking the acidic component would better activate the zoysn (sonething she discussed with her microbiologist brother). She has reported improvement in wound healing. 10/4; patient presents for follow-up. She has no issues or complaints today. We have been doing Aquacel and keystone under 3 layer compression to the lower extremities bilaterally. This morning she took the right lower extremity wrap off as it was uncomfortable. She has no open wounds to this leg. 10/11; patient presents for follow-up. We have been doing Aquacel with Keystone antibiotic ointment under 3 layer compression to the left lower extremity. She developed a small blister to the anterior aspect of the left leg noticed when the wrap was taken off on intake. She currently denies signs of infection. 10/18; patient presents for follow-up. We have been doing Aquacel with Keystone antibiotic ointment under 3 layer compression to the left lower extremity. There has been continued improvement in wound healing. She denies signs of infection. 10/25; patient arrives for treatment of venous insufficiency ulcers on her left lower leg both lateral and medial are remanence of apparently a circumferential wound. Much improved. We are using topicals Keystone and Aquacel Ag under 3 layer compression we continue to make good progress. The patient talk to me at some length with regards to different things she has on her forehead and her Peri orbital area for which she is apparently applying [place]. She feels that what ever we are treating on her wounds is a more systemic problem. I really was not able to get a handle on what she is talking about however I did caution her not to put the [place] in her eyes. 11/1; her wounds continue  to improve she is using Keystone and  Aquacel Ag G under 3 layer compression. Our intake nurse notes erythema and edema in the right leg. The patient has a litany of concerns with regards to a rash on her forehead or ears and other systemic complaints. She has an appointment with dermatology on November 11 11/8; patient presents for follow-up. We have been using Keystone and Aquacel under 4-layer compression. She has no issues or complaints today. She reports improvement in wound healing. 11/15; patient presents for follow-up. We have been using Aquacel with Keystone antibiotic under 3 layer compression. Patient continues improvement in wound healing. 12/6; patient presents for follow-up. We have been using Aquacel with Keystone antibiotic ointment under 3 layer compression. Wounds appear well-healing. 12/13; patient presents for follow-up. We have been using Aquacel with Keystone antibiotic under 3 layer compression. She has no issues or complaints today. 12/27 left lateral medial ankle. Superficial wounds remain there is significantly improved we are using Keystone backed with Zetuvit under 4-layer compression 1/3; patient presents for follow-up. Her wounds appear well-healing. We have been using Aquacel Ag with Keystone antibiotic ointment under compression therapy. This should be a 3 layer compression. Laura, Santiago ([id number]) [id number] Page 13 of 18 1/17; patient presents for follow-up. Her wounds on her left lower extremity are well-healing. We are using Aquacel Ag and Keystone antibiotic ointment under compression therapy. She missed her last clinic appointment. She states that the wrap has been changed twice weekly since she was last seen. Unfortunately she has developed increased warmth and redness to the right lower extremity consistent with cellulitis. She states this started a few days ago. 1/24; patient presents for follow-up. Her wounds on the left lower extremity are well-healing with  Aquacel and Keystone antibiotic ointment under compression therapy. I prescribed Keflex at last clinic visit for cellulitis of the right lower extremity. Unfortunately this has not resolved. She did not follow-up with her PCP for contact our office about her symptoms. 1/31; patient presents for follow-up. We have been using Aquacel and Keystone antibiotic ointment under compression therapy to the left lower extremity. Wounds appear well-healing. She has been taking clindamycin for the past week. Her symptoms have improved slightly with a decrease in erythema and warmth to the right lower extremity. She says her pain level has improved. However Symptoms have not completely resolved. She denies fever/chills, nausea/vomiting. 2/7; patient presents for follow-up. We have been using Aquacel Ag with Keystone antibiotic ointment under compression therapy to the left lower extremity. For the past week home health has not been using Keystone antibiotic ointment. She continues to take clindamycin. Her symptoms have improved greatly with the decrease in erythema and warmth to the right lower extremity. She denies systemic signs of infection. She has an area of skin breakdown to the right anterior leg. 2/14; Patient presents for follow-up. She has been using Hydrofera Blue to the right anterior leg under Tubigrip. It looks like Hydrofera Blue is also being used with Keystone to the left lower extremity under compression therapy. Order is for Aqualcell Ag. Overall wounds appear well healing. She has no issues or complaints. 2/21; patient presents for follow-up. We have been using Hydrofera Blue under 3 layer compression to the lower extremities bilaterally. Her right lower extremity wounds have healed. She has a small open wound remaining to her left lateral leg. 2/28; patient presents for follow-up. We have been using Hydrofera Blue under 3 layer compression. Home health has changed the dressing to the left  lower extremity however started the wrap at the ankle. 3/6; patient presents for follow-up. We have been using PolyMem with antibiotic ointment under 3 layer compression to the left lower extremity. She has no issues or complaints today. 3/12; patient presents for follow-up. We have been using Aquacel Ag under 3 layer compression to the left lower extremity. Her wound is healed. She has juxta lite compression wraps at home. Readmission: 03-13-2023 upon evaluation today patient appears to be doing poorly in regard to her lower extremities. Specifically it is the left lower extremity at this point that is causing her problems she has multiple wounds open. With that being said she does not sound like she has been wearing her compression stockings on a regular basis she has previously seen Dr. Mikey Bussing. With that being said upon evaluation today she tells me that since the wounds have reopened that she has been having a lot of discomfort she also tells me that she went back to using some of the dressings that she had leftover at home she does not know exactly what everything was. Nonetheless she also tells me that she has made an appointment with a dermatologist in town and has to see them tomorrow therefore she is not going to allow [place] to wrap her legs today since "that did not work anyway". With that being said obviously she did improve last time we got her wound healed we order her juxta lite compression wraps again it does not sound like she has been using those appropriately at home. 4/10; patient presents for follow-up. Unfortunately she reopened last week and was seen by [facility] At that time the patient declined in office wraps. She states she is wearing her juxta lite compression wraps however she did not have these on today. 4/17; patient presents for follow-up. At last clinic visit a PCR culture was done that grew Staph aureus and Enterobacter cola CA and Enterococcus bacillus. Keystone antibiotic  ointment was ordered. She has not received this yet. We have been using antibiotic ointment with Hydrofera Blue under compression therapy. There is been improvement in her wound healing. She has no issues or complaints today. 4/24; patient presents for follow-up. She received Keystone antibiotic ointment and this was started 3 days ago when home health came to change the wrap. She has no issues or complaints today. 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 She has been using Keystone Aquacel Ag under compression. She tells me she is recently started on chemotherapy for metastatic breast cancer with "bone mets" I am not exactly sure at this point what drugs she is receiving. 5/22; patient presents for follow-up. We have been using Keystone antibiotic ointment and Aquacel Ag under compression therapy. Wounds are improved. 5/29; patient presents for follow-up. We have been using Aquacel with Keystone antibiotic ointment under 3 layer compression. Patient has no issues or complaints today. Overall wounds appear well-healing. 6/5; we have been following this patient for wounds on the left leg. We have been using Keystone and Aquacel under 3 layer compression on the left. On the right leg she is supposed to be using a juxta lite She comes in today with the left leg wound stable to improved however the right leg was very swollen with threatening ulceration and weeping edema fluid. 6/12; patient presents for follow-up. We have been using Keystone and Aquacel under 3 layer compression  on the left. She had a compression wrap placed on the right at last clinic visit due to threatening ulcerations and weeping however home health did not place a new wrap. She had no compression garment on today. She has scabs to previous open weeping  sites. Objective Constitutional Vitals Time Taken: 10:09 AM, Height: 66 in, Weight: 153 lbs, BMI: 24.7, Temperature: 98.2 F, Pulse: 88 bpm, Respiratory Rate: 18 breaths/min, Blood Pressure: 142/78 mmHg. General Notes: Left lower extremity: T the distal aspect there are are several scattered open wounds with granulation tissue and nonviable tissue. 2+ pitting Laura, Santiago ([id number]) [id number] Page 14 of 18 edema to the knee. No signs of surrounding infection including increased warmth, erythema or purulent drainage. Right leg: 2+ pitting edema to the knee without weeping or open wounds. No signs of infection. Integumentary (Hair, Skin) Wound #10 status is Open. Original cause of wound was Gradually Appeared. The date acquired was: 03/04/2023. The wound has been in treatment 10 weeks. The wound is located on the Left,Posterior Lower Leg. The wound measures 2.5cm length x 3.4cm width x 0.1cm depth; 6.676cm^2 area and 0.668cm^3 volume. There is Fat Layer (Subcutaneous Tissue) exposed. There is a medium amount of serosanguineous drainage noted. The wound margin is distinct with the outline attached to the wound base. There is small (1-33%) red granulation within the wound bed. There is a large (67-100%) amount of necrotic tissue within the wound bed. Wound #11 status is Open. Original cause of wound was Gradually Appeared. The date acquired was: 03/04/2023. The wound has been in treatment 10 weeks. The wound is located on the Left,Lateral Lower Leg. The wound measures 0.2cm length x 0.2cm width x 0.1cm depth; 0.031cm^2 area and 0.003cm^3 volume. There is Fat Layer (Subcutaneous Tissue) exposed. There is a small amount of serosanguineous drainage noted. The wound margin is distinct with the outline attached to the wound base. There is large (67-100%) red granulation within the wound bed. There is a small (1-33%) amount of necrotic tissue within the wound bed  including Adherent Slough. Wound #12 status is Open. Original cause of wound was Gradually Appeared. The date acquired was: 03/04/2023. The wound has been in treatment 10 weeks. The wound is located on the Left,Medial Ankle. The wound measures 2cm length x 1.7cm width x 0.1cm depth; 2.67cm^2 area and 0.267cm^3 volume. There is Fat Layer (Subcutaneous Tissue) exposed. There is a medium amount of serosanguineous drainage noted. The wound margin is distinct with the outline attached to the wound base. There is small (1-33%) pink granulation within the wound bed. There is a large (67-100%) amount of necrotic tissue within the wound bed including Adherent Slough. Wound #13 status is Open. Original cause of wound was Not Known. The date acquired was: 02/14/2023. The wound has been in treatment 7 weeks. The wound is located on the Left,Lateral,Posterior Lower Leg. The wound measures 5cm length x 3cm width x 0.2cm depth; 11.781cm^2 area and 2.356cm^3 volume. There is Fat Layer (Subcutaneous Tissue) exposed. There is a medium amount of serosanguineous drainage noted. There is medium (34-66%) red granulation within the wound bed. There is a small (1-33%) amount of necrotic tissue within the wound bed including Adherent Slough. 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 I  debrided nonviable tissue and recommended continue the course with [facility] antibiotic and Aquacel Ag under 3 layer compression. She has no open wounds on the right leg. I recommended wearing her compression garment daily here. Follow-up in 1 week. Procedures Wound #10 Pre-procedure diagnosis of Wound #10 is a Venous Leg  Ulcer located on the Left,Posterior Lower Leg .Severity of Tissue Pre Debridement is: Fat layer exposed. There was a Selective/Open Wound Non-Viable Tissue Debridement with a total area of 6.67 sq cm performed by Geralyn Corwin, MD. With the following instrument(s): Curette to remove Viable and Non-Viable tissue/material. Material removed includes Slough. A time out was conducted at 10:36, prior to the start of the procedure. A Minimum amount of bleeding was controlled with Pressure. The procedure was tolerated well. Post Debridement Measurements: 2.5cm length x 3.4cm width x 0.1cm depth; 0.668cm^3 volume. Character of Wound/Ulcer Post Debridement is stable. Severity of Tissue Post Debridement is: Fat layer exposed. Post procedure Diagnosis Wound #10: Same as Pre-Procedure Pre-procedure diagnosis of Wound #10 is a Venous Leg Ulcer located on the Left,Posterior Lower Leg . There was a Three Layer Compression Therapy Procedure with a pre-treatment ABI of 1.3 by Betha Loa. Post procedure Diagnosis Wound #10: Same as Pre-Procedure Wound #12 Pre-procedure diagnosis of Wound #12 is a Venous Leg Ulcer located on the Left,Medial Ankle .Severity of Tissue Pre Debridement is: Fat layer exposed. There was a Selective/Open Wound Non-Viable Tissue Debridement with a total area of 2.67 sq cm performed by Geralyn Corwin, MD. With the following instrument(s): Curette to remove Viable and Non-Viable tissue/material. Material removed includes Slough. A time out was conducted at 10:35, prior to the start of the procedure. A Minimum amount of bleeding was controlled with Pressure. The procedure was tolerated well. Post Debridement Measurements: 2cm length x 1.7cm width x 0.1cm depth; 0.267cm^3 volume. Character of Wound/Ulcer Post Debridement is stable. Severity of Tissue Post Debridement is: Fat layer exposed. Post procedure Diagnosis Wound #12: Same as Pre-Procedure Wound #13 Pre-procedure diagnosis of  Wound #13 is a Venous Leg Ulcer located on the Left,Lateral,Posterior Lower Leg .Severity of Tissue Pre Debridement is: Fat layer exposed. There was a Selective/Open Wound Non-Viable Tissue Debridement with a total area of 13.35 sq cm performed by Geralyn Corwin, MD. With the following instrument(s): Curette to remove Viable and Non-Viable tissue/material. Material removed includes Slough. A time out was conducted at 10:37, prior to the start of the procedure. A Minimum amount of bleeding was controlled with Pressure. The procedure was tolerated well. Post Debridement Measurements: 5cm length x 3.4cm width x 0.2cm depth; 2.67cm^3 volume. Character of Wound/Ulcer Post Debridement is stable. Severity of Tissue Post Debridement is: Fat layer exposed. Post procedure Diagnosis Wound #13: Same as Pre-Procedure Laura, Santiago ([id number]) [id number] Page 15 of 18 Plan Follow-up Appointments: Return Appointment in 1 week. Nurse Visit as needed Home Health: Home Health Company: [facility] [phone] **Please direct any NON-WOUND related issues/requests for orders to patient's Primary Care Physician. **If current dressing causes regression in wound condition, may D/C ordered dressing product/s and apply Normal Saline Moist Dressing daily until next Wound Healing Center or Other MD appointment. **Notify Wound Healing Center of regression in wound condition at [phone]. Bathing/ Shower/ Hygiene: May shower with wound dressing protected with water repellent cover or cast protector. Edema Control - Lymphedema / Segmental Compressive Device / Other: Optional: One layer of unna paste to top of compression wrap (to act as an anchor). Tubigrip single layer applied. - Tubi D right leg  Elevate, Exercise Daily and Avoid Standing for Long Periods of Time. Elevate legs to the level of the heart and pump ankles as often as possible Elevate leg(s) parallel to the floor when  sitting. DO YOUR BEST to sleep in the bed at night. DO NOT sleep in your recliner. Long hours of sitting in a recliner leads to swelling of the legs and/or potential wounds on your backside. Additional Orders / Instructions: Follow Nutritious Diet and Increase Protein Intake WOUND #10: - Lower Leg Wound Laterality: Left, Posterior Topical: Keystone 3 x Per Week/30 Days Prim Dressing: Aquacel Extra Hydrofiber Dressing, 4x5 (in/in) 3 x Per Week/30 Days ary Secondary Dressing: ABD Pad 5x9 (in/in) 3 x Per Week/30 Days Discharge Instructions: Cover with ABD pad Com pression Wrap: 3-LAYER WRAP - Profore Lite LF 3 Multilayer Compression Bandaging System 3 x Per Week/30 Days Discharge Instructions: Apply 3 multi-layer wrap as prescribed. WOUND #11: - Lower Leg Wound Laterality: Left, Lateral Topical: Keystone 3 x Per Week/30 Days Prim Dressing: Aquacel Extra Hydrofiber Dressing, 4x5 (in/in) 3 x Per Week/30 Days ary Secondary Dressing: ABD Pad 5x9 (in/in) 3 x Per Week/30 Days Discharge Instructions: Cover with ABD pad Com pression Wrap: 3-LAYER WRAP - Profore Lite LF 3 Multilayer Compression Bandaging System 3 x Per Week/30 Days Discharge Instructions: Apply 3 multi-layer wrap as prescribed. WOUND #12: - Ankle Wound Laterality: Left, Medial Topical: Keystone 3 x Per Week/30 Days Prim Dressing: Aquacel Extra Hydrofiber Dressing, 4x5 (in/in) 3 x Per Week/30 Days ary Secondary Dressing: ABD Pad 5x9 (in/in) 3 x Per Week/30 Days Discharge Instructions: Cover with ABD pad Com pression Wrap: 3-LAYER WRAP - Profore Lite LF 3 Multilayer Compression Bandaging System 3 x Per Week/30 Days Discharge Instructions: Apply 3 multi-layer wrap as prescribed. WOUND #13: - Lower Leg Wound Laterality: Left, Lateral, Posterior Topical: Keystone 3 x Per Week/30 Days Prim Dressing: Aquacel Extra Hydrofiber Dressing, 4x5 (in/in) 3 x Per Week/30 Days ary Secondary Dressing: ABD Pad 5x9 (in/in) 3 x Per Week/30  Days Discharge Instructions: Cover with ABD pad Com pression Wrap: 3-LAYER WRAP - Profore Lite LF 3 Multilayer Compression Bandaging System 3 x Per Week/30 Days Discharge Instructions: Apply 3 multi-layer wrap as prescribed. 1. Keystone antibiotic ointment with Aquacel Ag under 3 layer compression to the left lower extremity 2. Compression garment daily to the right lower extremity 3. Follow-up in 1 week Electronic Signature(s) Signed: 05/23/2023 12:36:50 PM By: Geralyn Corwin DO Entered By: Geralyn Corwin on 05/23/2023 12:25:20 -------------------------------------------------------------------------------- ROS/PFSH Details Patient Name: Date of Service: Laura Santiago, BO Laura M. 05/23/2023 10:00 A M Medical Record Number: [id number] Patient Account Number: [id number] Date of Birth/Sex: Treating RN: 1946/06/15 (77 y.o. Laura Santiago Primary Care Provider: Marisue Ivan Other Clinician: Lavisha, [place] ([id number]) [id number] Page 16 of 18 Referring Provider: Treating Provider/Extender: Greta Doom in Treatment: 69 Information Obtained From Patient Eyes Medical History: Negative for: Cataracts; Glaucoma; Optic Neuritis Ear/Nose/Mouth/Throat Medical History: Negative for: Chronic sinus problems/congestion; Middle ear problems Hematologic/Lymphatic Medical History: Negative for: Anemia; Hemophilia; Human Immunodeficiency Virus; Lymphedema; Sickle Cell Disease Respiratory Medical History: Negative for: Aspiration; Asthma; Chronic Obstructive Pulmonary Disease (COPD); Pneumothorax; Sleep Apnea; Tuberculosis Cardiovascular Medical History: Positive for: Hypertension Negative for: Angina; Arrhythmia; Congestive Heart Failure; Coronary Artery Disease; Deep Vein Thrombosis; Hypotension; Myocardial Infarction; Peripheral Arterial Disease; Peripheral Venous Disease; Phlebitis;  Vasculitis Gastrointestinal Medical History: Negative for: Cirrhosis ; Colitis; Crohns; Hepatitis A; Hepatitis B; Hepatitis C Endocrine Medical History: Negative for: Type I Diabetes; Type  II Diabetes Genitourinary Medical History: Negative for: End Stage Renal Disease Immunological Medical History: Negative for: Lupus Erythematosus; Raynauds; Scleroderma Integumentary (Skin) Medical History: Negative for: History of Burn; History of pressure wounds Musculoskeletal Medical History: Positive for: Osteoarthritis Negative for: Gout; Rheumatoid Arthritis; Osteomyelitis Oncologic Medical History: Positive for: Received Chemotherapy; Received Radiation Past Medical History Notes: breast cancer Immunizations Pneumococcal Vaccine: Received Pneumococcal Vaccination: No Implantable Devices None Laura, Santiago ([id number]) [id number] Page 60 of 105 Family and Social History 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 05/23/2023 Medical Record Number: [id number] Patient Account Number: [id number] Date of Birth/Sex: Treating RN: 1946-02-01 (77 y.o. 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 Z79.01 Long term (current) use of anticoagulants I10 Essential (primary)  hypertension 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 Facility Procedures : CPT4 Code: [id number] Description: 97597 - DEBRIDE WOUND 1ST 20 SQ CM OR < ICD-10 Diagnosis Description 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.312 Chronic venous hypertension (idiopathic) with ulcer of left lower  extremity Modifier: Quantity: 1 : CPT4 Code: [id number] Description: 97598 - DEBRIDE WOUND EA ADDL 20 SQ CM ICD-10 Diagnosis Description 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 Modifier: Quantity: 1 Physician Procedures : CPT4 Code Description Modifier [id number] 97597 - WC PHYS DEBR WO ANESTH 20 SQ CM ICD-10 Diagnosis Description 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.312 Chronic venous hypertension (idiopathic) with  ulcer of left lower extremity Quantity: 1 : [id number] 97598 - WC PHYS DEBR WO ANESTH EA ADD 20 CM ICD-10 Diagnosis Description 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.312 Chronic venous hypertension (idiopathic) with ulcer of left lower  extremity Quantity: 1 Electronic Signature(s) Signed: 05/23/2023 12:36:50 PM By: Haynes Bast ([id number]) PM By: Geralyn Corwin DO [id number] Page 18 of 18 Signed: 05/23/2023 12:36:50 Entered By: Geralyn Corwin on 05/23/2023 12:25:40

## 2023-05-29 ENCOUNTER — Encounter: Payer: Self-pay | Admitting: Oncology

## 2023-05-29 ENCOUNTER — Ambulatory Visit
Admission: RE | Admit: 2023-05-29 | Discharge: 2023-05-29 | Disposition: A | Discharge status: Home / Self Care | Payer: Medicare Other | Source: Ambulatory Visit | Attending: Oncology | Admitting: Oncology

## 2023-05-29 DIAGNOSIS — Z79899 Other long term (current) drug therapy: Secondary | ICD-10-CM | POA: Diagnosis not present

## 2023-05-29 DIAGNOSIS — C50919 Malignant neoplasm of unspecified site of unspecified female breast: Secondary | ICD-10-CM

## 2023-05-29 DIAGNOSIS — N189 Chronic kidney disease, unspecified: Secondary | ICD-10-CM | POA: Insufficient documentation

## 2023-05-29 DIAGNOSIS — Z5181 Encounter for therapeutic drug level monitoring: Secondary | ICD-10-CM

## 2023-05-29 DIAGNOSIS — I129 Hypertensive chronic kidney disease with stage 1 through stage 4 chronic kidney disease, or unspecified chronic kidney disease: Secondary | ICD-10-CM | POA: Diagnosis not present

## 2023-05-29 LAB — ECHOCARDIOGRAM COMPLETE
AR max vel: 2.2 cm2
AV Area VTI: 2.46 cm2
AV Area mean vel: 2.35 cm2
AV Mean grad: 5 mmHg
AV Peak grad: 11.6 mmHg
Ao pk vel: 1.7 m/s
Area-P 1/2: 3.05 cm2
Calc EF: 51.2 %
MV VTI: 2.58 cm2
S' Lateral: 3.7 cm
Single Plane A2C EF: 55.5 %
Single Plane A4C EF: 50.2 %

## 2023-05-29 NOTE — Progress Notes (Signed)
*  PRELIMINARY RESULTS* Echocardiogram 2D Echocardiogram has been performed.  Carolyne Fiscal 05/29/2023, 12:03 PM

## 2023-05-30 ENCOUNTER — Encounter (HOSPITAL_BASED_OUTPATIENT_CLINIC_OR_DEPARTMENT_OTHER): Payer: Medicare Other | Admitting: Internal Medicine

## 2023-05-30 ENCOUNTER — Encounter: Payer: Self-pay | Admitting: Oncology

## 2023-05-30 DIAGNOSIS — I87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: Secondary | ICD-10-CM | POA: Diagnosis not present

## 2023-05-30 DIAGNOSIS — I10 Essential (primary) hypertension: Secondary | ICD-10-CM

## 2023-05-30 DIAGNOSIS — L97822 Non-pressure chronic ulcer of other part of left lower leg with fat layer exposed: Secondary | ICD-10-CM | POA: Diagnosis not present

## 2023-05-30 DIAGNOSIS — I87313 Chronic venous hypertension (idiopathic) with ulcer of bilateral lower extremity: Secondary | ICD-10-CM | POA: Diagnosis not present

## 2023-05-30 DIAGNOSIS — Z7901 Long term (current) use of anticoagulants: Secondary | ICD-10-CM

## 2023-05-31 ENCOUNTER — Ambulatory Visit: Payer: Medicare Other

## 2023-06-01 ENCOUNTER — Other Ambulatory Visit: Payer: Self-pay | Admitting: Oncology

## 2023-06-01 MED ORDER — FENTANYL 25 MCG/HR TD PT72: MEDICATED_PATCH | TRANSDERMAL | 0 refills | Status: AC

## 2023-06-01 MED ORDER — OXYCODONE HCL 10 MG PO TABS: 15.0000 mg | ORAL_TABLET | Freq: Four times a day (QID) | ORAL | 0 refills | Status: AC | PRN

## 2023-06-04 NOTE — Progress Notes (Signed)
MIKELL, CAMP ([id number]) [id number] Page 1 of 17 Visit Report for 05/30/2023 Chief Complaint Document Details Patient Name: Date of Service: Laura Santiago. 05/30/2023 10:00 A M Medical Record Number: [id number] Patient Account Number: [id number] Date of Birth/Sex: Treating RN: 1945/12/26 (77 y.o. Skip Mayer Primary Care Provider: Marisue Ivan Other Clinician: Betha Loa Referring Provider: Treating Provider/Extender: Greta Doom in Treatment: 58 Information Obtained from: Patient Chief Complaint Left lower extremity wound Right toe wounds Left upper lateral thigh wounds Electronic Signature(s) Signed: 05/31/2023 12:49:28 PM By: Geralyn Corwin DO Entered By: Geralyn Corwin on 05/30/2023 11:20:23 -------------------------------------------------------------------------------- Debridement Details Patient Name: Date of Service: Laura Muscat [name] 05/30/2023 10:00 A M Medical Record Number: [id number] Patient Account Number: [id number] Date of Birth/Sex: Treating RN: [birth year] (77 y.o. [name], [name] Primary Care Provider: Marisue Ivan Other Clinician: Betha Loa Referring Provider: Treating Provider/Extender: Greta Doom in Treatment: 99 Debridement Performed for Assessment: Wound #10 Left,Posterior Lower Leg Performed By: Physician Geralyn Corwin, MD Debridement Type: Debridement Severity of Tissue Pre Debridement: Fat layer exposed Level of Consciousness (Pre-procedure): Awake and Alert Pre-procedure Verification/Time Out Yes - 10:40 Taken: Start Time: 10:40 Percent of Wound Bed Debrided: 100% T Area Debrided (cm): otal 0.21 Tissue and other material debrided: Viable, Non-Viable, Slough, Subcutaneous, Slough Level: Skin/Subcutaneous Tissue Debridement Description: Excisional Instrument: Curette Bleeding: Minimum Hemostasis Achieved:  Pressure Response to Treatment: Procedure was tolerated well Laura Santiago, Laura Santiago ([id number]) [id number] Page 2 of 17 Level of Consciousness (Post- Awake and Alert procedure): Post Debridement Measurements of Total Wound Length: (cm) 0.3 Width: (cm) 0.9 Depth: (cm) 0.1 Volume: (cm) 0.021 Character of Wound/Ulcer Post Debridement: Improved Severity of Tissue Post Debridement: Fat layer exposed Post Procedure Diagnosis Same as Pre-procedure Electronic Signature(s) Signed: 05/31/2023 12:49:28 PM By: Geralyn Corwin DO Signed: 05/31/2023 5:12:55 PM By: Betha Loa Signed: 06/04/2023 9:09:50 AM By: Elliot Gurney, BSN, RN, CWS, [name] RN, BSN Entered By: Betha Loa on 05/30/2023 10:47:29 -------------------------------------------------------------------------------- Debridement Details Patient Name: Date of Service: Laura Muscat [name] 05/30/2023 10:00 A M Medical Record Number: [id number] Patient Account Number: [id number] Date of Birth/Sex: Treating RN: 08-12-1946 (77 y.o. Skip Mayer Primary Care Provider: Marisue Ivan Other Clinician: Betha Loa Referring Provider: Treating Provider/Extender: Greta Doom in Treatment: 99 Debridement Performed for Assessment: Wound #11 Left,Lateral Lower Leg Performed By: Physician Geralyn Corwin, MD Debridement Type: Debridement Severity of Tissue Pre Debridement: Fat layer exposed Level of Consciousness (Pre-procedure): Awake and Alert Pre-procedure Verification/Time Out Yes - 10:41 Taken: Start Time: 10:41 Percent of Wound Bed Debrided: 100% T Area Debrided (cm): otal 10.93 Tissue and other material debrided: Viable, Non-Viable, Slough, Subcutaneous, Slough Level: Skin/Subcutaneous Tissue Debridement Description: Excisional Instrument: Curette Bleeding: Minimum Hemostasis Achieved: Pressure Response to Treatment: Procedure was tolerated well Level of Consciousness (Post-  Awake and Alert procedure): Post Debridement Measurements of Total Wound Length: (cm) 4.8 Width: (cm) 2.9 Depth: (cm) 0.2 Volume: (cm) 0.021 Character of Wound/Ulcer Post Debridement: Improved Severity of Tissue Post Debridement: Fat layer exposed Post Procedure Diagnosis Same as Pre-procedure Electronic Signature(s) RAIDYN, BREINER ([id number]) [id number] Page 3 of 17 Signed: 05/31/2023 12:49:28 PM By: Geralyn Corwin DO Signed: 05/31/2023 5:12:55 PM By: Betha Loa Signed: 06/04/2023 9:09:50 AM By: Elliot Gurney, BSN, RN, CWS, [name] RN, BSN Entered By: Betha Loa on 05/30/2023 10:48:08 -------------------------------------------------------------------------------- Debridement Details Patient Name: Date of Service: Laura Muscat [name] 05/30/2023 10:00 A M Medical Record Number: [id number] Patient Account Number: [id number] Date  of Birth/Sex: Treating RN: 1946-04-02 (77 y.o. Skip Mayer Primary Care Provider: Marisue Ivan Other Clinician: Betha Loa Referring Provider: Treating Provider/Extender: Greta Doom in Treatment: 99 Debridement Performed for Assessment: Wound #12 Left,Medial Ankle Performed By: Physician Geralyn Corwin, MD Debridement Type: Debridement Severity of Tissue Pre Debridement: Fat layer exposed Level of Consciousness (Pre-procedure): Awake and Alert Pre-procedure Verification/Time Out Yes - 10:43 Taken: Start Time: 10:43 Percent of Wound Bed Debrided: 100% T Area Debrided (cm): otal 1.13 Tissue and other material debrided: Viable, Non-Viable, Slough, Subcutaneous, Slough Level: Skin/Subcutaneous Tissue Debridement Description: Excisional Instrument: Curette Bleeding: Minimum Hemostasis Achieved: Pressure Response to Treatment: Procedure was tolerated well Level of Consciousness (Post- Awake and Alert procedure): Post Debridement Measurements of Total Wound Length: (cm)  1.8 Width: (cm) 0.8 Depth: (cm) 0.1 Volume: (cm) 0.021 Character of Wound/Ulcer Post Debridement: Improved Severity of Tissue Post Debridement: Fat layer exposed Post Procedure Diagnosis Same as Pre-procedure Electronic Signature(s) Signed: 05/31/2023 12:49:28 PM By: Geralyn Corwin DO Signed: 05/31/2023 5:12:55 PM By: Betha Loa Signed: 06/04/2023 9:09:50 AM By: Elliot Gurney, BSN, RN, CWS, [name] RN, BSN Entered By: Betha Loa on 05/30/2023 10:48:56 Laura Santiago ([id number]) [id number] Page 4 of 17 -------------------------------------------------------------------------------- HPI Details Patient Name: Date of Service: Laura Santiago. 05/30/2023 10:00 A M Medical Record Number: [id number] Patient Account Number: [id number] Date of Birth/Sex: Treating RN: 1946/09/14 (77 y.o. Skip Mayer Primary Care Provider: Marisue Ivan Other Clinician: Betha Loa Referring Provider: Treating Provider/Extender: Greta Doom in Treatment: 38 History of Present Illness HPI Description: Admission 7/27 Laura Santiago is a 77 year old female with a past medical history of ADHD, metastatic breast cancer, stage IV chronic kidney disease, history of DVT on Xarelto and chronic venous insufficiency that presents to the clinic for a chronic left lower extremity wound. She recently moved to [facility] 4 days ago. She was being followed by wound care center in [place]. She reports a 10-year history of wounds to her left lower extremity that eventually do heal with debridement and compression therapy. She states that the current wound reopened 4 months ago and she is using Vaseline and Coban. She denies signs of infection. 8/3; patient presents for 1 week follow-up. She reports no issues or complaints today. She states she had vascular studies done in the last week. She denies signs of infection. She brought her little  service dog with her today. 8/17; patient presents for follow-up. She has missed her last clinic appointment. She states she took the wrap off and attempted to rewrap her leg. She is having difficulty with transportation. She has her service dog with her today. Overall she feels well and reports improvement in wound healing. She denies signs of infection. She reports owning an old Velcro wrap compression and has this at her living facility 9/14; patient presents for follow-up. Patient states that over the past 2 to 3 weeks she developed toe wounds to her right foot. She attributes this to tight fitting shoes. She subsequently developed cellulitis in the right leg and has been treated by doxycycline by her oncologist. She reports improvement in symptoms however continues to have some redness and swelling to this leg. T the left lower extremity patient has been having her wraps changed with home health twice weekly. She states that the [facility] is not helping control o the drainage. Other than that she has no issues or complaints today. She denies signs of infection to the left lower extremity.  9/21; patient presents for follow-up. She reports seeing infectious disease for her cellulitis. She reports no further management. She has home health that changes the wraps twice weekly. She has no issues or complaints today. She denies signs of infection. 10/5; patient presents for follow-up. She has no issues or complaints today. She denies signs of infection. She states that the right great toe has not been dressed by home health. 10/12; patient presents for follow-up. She has no issues or complaints today. She reports improvement in her wound healing. She has been using silver alginate to the right great toe wound. She denies signs of infection. 10/26; patient presents for follow-up. Home health did not have sorbact so they continued to use Hydrofera Blue under the wrap. She has been using  silver alginate to the great toe wound however she did not have a dressing in place today. She currently denies signs of infection. 11/2; patient presents for follow-up. She has been using sorb act under the compression wrap. She reports using silver alginate to the toe wound again she does not have a dressing in place. She currently denies signs of infection. 11/23; patient presents for follow-up. Unfortunately she has missed her last 2 clinic appointments. She was last seen 3 weeks ago. She did her own compression wrap with Kerlix and Coban yesterday after seeing vein and vascular. She has not been dressing her right great toe wound. She currently denies signs of infection. 11/30; patient presents for 1 week follow-up. She states she changed her dressing last week prior to home health and use sorb act with Dakin's and Hydrofera Blue. Home health has changed the dressing as well and they have been using sorbact. T oday she reports increased redness to her right lower extremity. She has a history of cellulitis to this leg. She has been using silver alginate to the right great toe. Unfortunately she had an episode of diarrhea prior to coming in and had feces all over the right leg and to the wrap of her left leg. 12/7; patient presents for 1 week follow-up. She states that home health did not come out to change the dressing and she took it off yesterday. It is unclear if she is dressing the right toe wound. She denies signs of infection. 12/14; patient presents for 1 week follow-up. She has no issues or complaints today. 12/21; patient presents for follow-up. She has no issues or complaints today. She denies signs of infection. 12/28/2021; patient presents for follow-up. She was hospitalized for sepsis secondary to right lower extremity cellulitis On 12/23. She states she is currently at a SNF. She states that she was started on doxycycline this morning for her right great toe swelling and redness. She  is not sure what dressings have been done to her left lower extremity for the past 3 weeks. She says its been mainly gauze with an Ace wrap. 1/25; patient presents for follow-up. She is still residing in a skilled nursing facility. She reports mild pain to the left lower extremity wound bed. She states she is going to see a podiatrist soon. 2/8; patient presents for follow-up. She has moved back to her residential community from her skilled nursing facility. She has no issues or complaints today. She denies signs of systemic infections. 2/15; patient presents for follow-up. He has no issues or complaints today. She denies systemic signs of infection. Laura Santiago, Laura Santiago ([id number]) [id number] Page 5 of 17 2/22; patient presents for follow-up. She has no issues or complaints today.  She denies signs of infection. 3/1; patient presents for follow-up. She states that home health came out the day after she was seen in our clinic and yesterday to do the wrap change. She denies signs of infection. She reports excoriated skin on the ankle. 3/8; patient presents for follow-up. She has no issues or complaints today. She denies signs of infection. 3/15; patient presents for follow-up. Home health has been coming out to change the dressings. She reports more tenderness to the wound site. She denies purulent drainage, increased warmth or erythema to the area. 4/5; patient presents for follow-up. She has missed her last 2 clinic appointments. I have not seen her in 3 weeks. She was recently hospitalized for altered mental status. She was involuntarily committed. She was evaluated by psychiatry and deemed to have competency. There was no specific cause of her altered mental status. It was concluded that her physical and mental health were declining due to her chronic medical conditions. Currently home health has been coming out for dressing changes. Patient has also been doing her own  dressing changes. She reports more skin breakdown to the periwound and now has a new wound. She denies fever/chills. She reports continued tenderness to the wound site. 4/12; patient with significant venous insufficiency and a large wound on her left lower leg taking up about 80% of the circumference of her lower leg. Cultures of this grew MRSA and Pseudomonas. She had completed a course of ciprofloxacin now is starting doxycycline. She has been using Dakin's wet-to-dry and a Tubigrip. She has home health twice a week and we change it once. 4/19; patient presents for follow-up. She completed her course of doxycycline. She has been using Dakin's wet-to-dry dressing and Tubigrip. Home health changes the dressing twice weekly. Currently she has no issues or complaints. 4/26; patient presents for follow-up. At last clinic visit orders for home health were Iodosorb under compression therapy. Unfortunately they did not have the dressing and have been using Dakin's and gentamicin under the wrap. Patient currently denies signs of infection. She has no issues or complaints today. 5/3; patient presents for follow-up. Again Iodosorb has not been used under the compression therapy when home health comes out to change the wrap and dressing. They have been using Sorbact. It is unclear why this is happening since we send orders weekly to the agency. She denies signs of infection. Patient has not purchased the [place] antibiotics. We reached out to the company and they said they have been trying to contact her on a regular basis. We gave the patient the number to call to order the medication. 5/10; patient presents for follow-up. She has no issues or complaints today. Again home health has not been using Iodosorb. Mepilex was on the wound bed. No other dressings noted. She brought in her Keystone antibiotics. She denies signs of infection. 5/17; patient presents for follow-up. Home health has come out twice since  she was last seen. Joint well she has been using Keystone antibiotic with Sorbact under the compression wrap. She has no issues or complaints today. She denies signs of infection. 5/24; patient presents for follow-up. We have been using Keystone antibiotics with Sorbact under compression therapy. She is tolerating the treatment well. She is reporting improvement in wound healing. She denies signs of infection. 5/31; patient presents for follow-up. We continue to do [facility] antibiotics with Sorbact under compression therapy. She continues to report improvement in wound healing. Home health comes out and changes the dressing once weekly.  05-17-2022 upon evaluation today patient appears to be doing better in regard to her wound especially compared to the last time I saw her. Fortunately I do think that she is seeing improvements. With that being said I do believe that she may be benefit from sharp debridement today to clear away some of the necrotic debris I discussed that with her as well. She is an amendable to that plan. Otherwise she is very pleased with how the [facility] is doing for her. 6/14; patient presents for follow-up. We have been using Keystone antibiotic with Sorbact and absorbent dressings under 3 layer compression. She has no issues or complaints today. She reports improvement in wound healing. She denies signs of infection. 6/21; patient presents for follow-up. We are continuing with [facility] antibiotic and Sorbact under 3 layer compression. Patient has no complaints. Continued wound healing is happening. She denies signs of infection. 6/28; patient presents for follow-up. We have been using Keystone antibiotic with Sorbact under 3 layer compression. Usually home health comes out and changes the dressing twice a week. Unfortunately they did not go out to change the dressing. It is unclear why. Patient did not call them. She currently denies signs of infection. 7/5; patient presents for  follow-up. We have been using Keystone antibiotic with calcium alginate under 3 layer compression. She reports improvement in wound healing. She denies signs of infection. Home health has come out to do dressing changes twice this past week. 7/12; patient presents for follow-up. We have been using Keystone antibiotic with calcium alginate under 3 layer compression. Patient states that home health came out once last week to change the dressing. She reports improvement in wound healing. She currently denies signs of infection. 7/19; patient presents for follow-up. We have been using Keystone antibiotic with calcium alginate under 3 layer compression. Home health came out once last week to change the dressing. She has no issues or complaints today. She denies signs of infection. 8/2; patient presents for follow-up. We have been using Keystone antibiotic with calcium alginate under 3 layer compression. Unfortunately she missed her appointment last week and home health did not come out to do dressing changes. Patient currently denies signs of infection. 8/9; patient presents for follow-up. We have been using Keystone with calcium alginate under 3 layer compression. She states that home health came out once last week. She currently denies signs of infection. Her wrap was completely wet. She states she was cleaning the top of the leg and water soaked down into the wrap. 8/16; patient presents for follow-up. We have been using Keystone with calcium alginate under 3 layer compression. She states that home health came out twice last week. She has no issues or complaints today. 8/23; patient presents for follow-up. He has been using Keystone with calcium alginate under 3 layer compression. Home health came out twice last week. She denies signs of infection. 8/30; patient presents for follow-up. We have been using Keystone with calcium alginate under 3 layer compression. Home health came out once last week  to change the dressing. Patient reports improvement in wound healing. She states she is almost done with her chemotherapy infusions and has 1 more left. 9/13; patient presents for follow-up. She has lost the capsules to her [facility] antibiotic which I believe is the vancomycin pills. She has her Zosyn powder today. We have been using Keystone antibiotic ointment with calcium alginate under 3 layer compression. She is concerned about systemic infection however her vitals are stable and there is  no surrounding soft tissue infection. She would like to remain a patient in our wound care center however would like a second opinion for her wound care at another facility. She asked to be referred to [facility] wound care center. 9/20; patient presents for follow-up. She found her vancomycin capsules and brought in her complete Keystone antibiotic ointment set today. Unfortunately she has developed skin breakdown and Erythema to the right lower extremity With increased swelling. She states she went to a pow wow Over the weekend and was on her feet for extended periods of time. She saw her oncologist yesterday who prescribed her doxycycline for her right lower extremity erythema. 9/27; patient presents for follow-up. We have been using Keystone antibiotic with Aquacel under 3 layer compression to the lower extremities bilaterally. When JASMIN, WINBERRY M ([id number]) [id number] Page 6 of 17 home health came and changed the wrap she secretly put coffee into the spray mix along with [facility] antibiotic on her leg thinking the acidic component would better activate the zoysn (sonething she discussed with her microbiologist brother). She has reported improvement in wound healing. 10/4; patient presents for follow-up. She has no issues or complaints today. We have been doing Aquacel and keystone under 3 layer compression to the lower extremities bilaterally. This morning she took the right lower  extremity wrap off as it was uncomfortable. She has no open wounds to this leg. 10/11; patient presents for follow-up. We have been doing Aquacel with Keystone antibiotic ointment under 3 layer compression to the left lower extremity. She developed a small blister to the anterior aspect of the left leg noticed when the wrap was taken off on intake. She currently denies signs of infection. 10/18; patient presents for follow-up. We have been doing Aquacel with Keystone antibiotic ointment under 3 layer compression to the left lower extremity. There has been continued improvement in wound healing. She denies signs of infection. 10/25; patient arrives for treatment of venous insufficiency ulcers on her left lower leg both lateral and medial are remanence of apparently a circumferential wound. Much improved. We are using topicals Keystone and Aquacel Ag under 3 layer compression we continue to make good progress. The patient talk to me at some length with regards to different things she has on her forehead and her Peri orbital area for which she is apparently applying [place]anger. She feels that what ever we are treating on her wounds is a more systemic problem. I really was not able to get a handle on what she is talking about however I did caution her not to put the Tuttle in her eyes. 11/1; her wounds continue to improve she is using Keystone and Aquacel Ag G under 3 layer compression. Our intake nurse notes erythema and edema in the right leg. The patient has a litany of concerns with regards to a rash on her forehead or ears and other systemic complaints. She has an appointment with dermatology on November 11 11/8; patient presents for follow-up. We have been using Keystone and Aquacel under 4-layer compression. She has no issues or complaints today. She reports improvement in wound healing. 11/15; patient presents for follow-up. We have been using Aquacel with Keystone antibiotic under 3 layer  compression. Patient continues improvement in wound healing. 12/6; patient presents for follow-up. We have been using Aquacel with Keystone antibiotic ointment under 3 layer compression. Wounds appear well-healing. 12/13; patient presents for follow-up. We have been using Aquacel with Keystone antibiotic under 3 layer compression. She has no issues or  complaints today. 12/27 left lateral medial ankle. Superficial wounds remain there is significantly improved we are using Keystone backed with Zetuvit under 4-layer compression 1/3; patient presents for follow-up. Her wounds appear well-healing. We have been using Aquacel Ag with Keystone antibiotic ointment under compression therapy. This should be a 3 layer compression. 1/17; patient presents for follow-up. Her wounds on her left lower extremity are well-healing. We are using Aquacel Ag and Keystone antibiotic ointment under compression therapy. She missed her last clinic appointment. She states that the wrap has been changed twice weekly since she was last seen. Unfortunately she has developed increased warmth and redness to the right lower extremity consistent with cellulitis. She states this started a few days ago. 1/24; patient presents for follow-up. Her wounds on the left lower extremity are well-healing with Aquacel and Keystone antibiotic ointment under compression therapy. I prescribed Keflex at last clinic visit for cellulitis of the right lower extremity. Unfortunately this has not resolved. She did not follow-up with her PCP for contact our office about her symptoms. 1/31; patient presents for follow-up. We have been using Aquacel and Keystone antibiotic ointment under compression therapy to the left lower extremity. Wounds appear well-healing. She has been taking clindamycin for the past week. Her symptoms have improved slightly with a decrease in erythema and warmth to the right lower extremity. She says her pain level has improved.  However Symptoms have not completely resolved. She denies fever/chills, nausea/vomiting. 2/7; patient presents for follow-up. We have been using Aquacel Ag with Keystone antibiotic ointment under compression therapy to the left lower extremity. For the past week home health has not been using Keystone antibiotic ointment. She continues to take clindamycin. Her symptoms have improved greatly with the decrease in erythema and warmth to the right lower extremity. She denies systemic signs of infection. She has an area of skin breakdown to the right anterior leg. 2/14; Patient presents for follow-up. She has been using Hydrofera Blue to the right anterior leg under Tubigrip. It looks like Hydrofera Blue is also being used with Keystone to the left lower extremity under compression therapy. Order is for Aqualcell Ag. Overall wounds appear well healing. She has no issues or complaints. 2/21; patient presents for follow-up. We have been using Hydrofera Blue under 3 layer compression to the lower extremities bilaterally. Her right lower extremity wounds have healed. She has a small open wound remaining to her left lateral leg. 2/28; patient presents for follow-up. We have been using Hydrofera Blue under 3 layer compression. Home health has changed the dressing to the left lower extremity however started the wrap at the ankle. 3/6; patient presents for follow-up. We have been using PolyMem with antibiotic ointment under 3 layer compression to the left lower extremity. She has no issues or complaints today. 3/12; patient presents for follow-up. We have been using Aquacel Ag under 3 layer compression to the left lower extremity. Her wound is healed. She has juxta lite compression wraps at home. Readmission: 03-13-2023 upon evaluation today patient appears to be doing poorly in regard to her lower extremities. Specifically it is the left lower extremity at this point that is causing her problems she has  multiple wounds open. With that being said she does not sound like she has been wearing her compression stockings on a regular basis she has previously seen Dr. Mikey Bussing. With that being said upon evaluation today she tells me that since the wounds have reopened that she has been having a lot of discomfort  she also tells me that she went back to using some of the dressings that she had leftover at home she does not know exactly what everything was. Nonetheless she also tells me that she has made an appointment with a dermatologist in town and has to see them tomorrow therefore she is not going to allow [place] to wrap her legs today since "that did not work anyway". With that being said obviously she did improve last time we got her wound healed we order her juxta lite compression wraps again it does not sound like she has been using those appropriately at home. 4/10; patient presents for follow-up. Unfortunately she reopened last week and was seen by [facility]. At that time the patient declined in office wraps. She states she is wearing her juxta lite compression wraps however she did not have these on today. 4/17; patient presents for follow-up. At last clinic visit a PCR culture was done that grew Staph aureus and Enterobacter cola CA and Enterococcus bacillus. Keystone antibiotic ointment was ordered. She has not received this yet. We have been using antibiotic ointment with Hydrofera Blue under compression therapy. There is been improvement in her wound healing. She has no issues or complaints today. 4/24; patient presents for follow-up. She received Keystone antibiotic ointment and this was started 3 days ago when home health came to change the wrap. She has no issues or complaints today. 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 She has been using Keystone Aquacel Ag under compression. She tells me she is recently started on chemotherapy for metastatic breast cancer with "bone mets" I am not exactly sure at this point what drugs she is receiving. 5/22; patient presents for follow-up. We have been using Keystone antibiotic ointment and Aquacel Ag under compression therapy. Wounds are improved. 5/29; patient presents for follow-up. We have been using Aquacel with Keystone antibiotic ointment under 3 layer compression. Patient has no issues or complaints today. Overall wounds appear well-healing. 6/5; we have been following this patient for wounds on the left leg. We have been using Keystone and Aquacel under 3 layer compression on the left. On the right leg she is supposed to be using a juxta lite She comes in today with the left leg wound stable to improved however the right leg was very swollen with threatening ulceration and weeping edema fluid. 6/12; patient presents for follow-up. We have been using Keystone and Aquacel under 3 layer compression on the left. She had a compression wrap placed on the right at last clinic visit due to threatening ulcerations and weeping however home health did not place a new wrap. She had no compression garment on today. She has scabs to previous open weeping sites. 6/19; patient presents for follow-up. We have been using Keystone antibiotic ointment with Aquacel Ag under 3 layer compression to the left lower extremity. Her wounds are smaller. She is moving to [place] and we will transfer notes to her new doctor. Our facility has already been in touch with them to arrange for wound care. Electronic Signature(s) Signed: 05/31/2023 12:49:28 PM By: Geralyn Corwin DO Entered By: Geralyn Corwin on 05/30/2023 11:56:55 -------------------------------------------------------------------------------- Physical Exam Details Patient Name:  Date of Service: Laura Muscat [name] 05/30/2023 10:00 A M Medical Record Number: [id number] Patient Account Number: [id number] Date of  Birth/Sex: Treating RN: September 01, 1946 (77 y.o. Skip Mayer Primary Care Provider: Marisue Ivan Other Clinician: Betha Loa Referring Provider: Treating Provider/Extender: Greta Doom in Treatment: 78 Constitutional . Cardiovascular . Psychiatric . Notes Left lower extremity: T the distal aspect there are are several scattered open wounds with granulation tissue and nonviable tissue. Good edema control. No o signs of surrounding infection including increased warmth, erythema or purulent drainage. Electronic Signature(s) Signed: 05/31/2023 12:49:28 PM By: Geralyn Corwin DO Entered By: Geralyn Corwin on 05/30/2023 11:57:24 Laura Santiago ([id number]) [id number] Page 8 of 17 -------------------------------------------------------------------------------- Physician Orders Details Patient Name: Date of Service: Laura Santiago. 05/30/2023 10:00 A M Medical Record Number: [id number] Patient Account Number: [id number] Date of Birth/Sex: Treating RN: 02-26-1946 (77 y.o. Skip Mayer Primary Care Provider: Marisue Ivan Other Clinician: Betha Loa Referring Provider: Treating Provider/Extender: Greta Doom in Treatment: 21 Verbal / Phone Orders: Yes Clinician: Huel Coventry Read Back and Verified: Yes Diagnosis Coding Follow-up Appointments Return Appointment in 1 week. Nurse Visit as needed Home Health Home Health Company: [facility] [phone] **Please direct any NON-WOUND related issues/requests for orders to patient's Primary Care Physician. **If current dressing causes regression in wound condition, may D/C ordered dressing product/s and apply Normal Saline Moist Dressing daily until next Wound Healing Center or Other MD  appointment. **Notify Wound Healing Center of regression in wound condition at [phone]. Bathing/ Shower/ Hygiene May shower with wound dressing protected with water repellent cover or cast protector. Edema Control - Lymphedema / Segmental Compressive Device / Other Optional: One layer of unna paste to top of compression wrap (to act as an anchor). Tubigrip single layer applied. - Tubi D right leg Elevate, Exercise Daily and A void Standing for Long Periods of Time. Elevate legs to the level of the heart and pump ankles as often as possible Elevate leg(s) parallel to the floor when sitting. DO YOUR BEST to sleep in the bed at night. DO NOT sleep in your recliner. Long hours of sitting in a recliner leads to swelling of the legs and/or potential wounds on your backside. Additional Orders / Instructions Follow Nutritious Diet and Increase Protein Intake Wound Treatment Wound #10 - Lower Leg Wound Laterality: Left, Posterior Topical: Keystone 3 x Per Week/30 Days Prim Dressing: Aquacel Extra Hydrofiber Dressing, 4x5 (in/in) ary 3 x Per Week/30 Days Secondary Dressing: ABD Pad 5x9 (in/in) 3 x Per Week/30 Days Discharge Instructions: Cover with ABD pad Compression Wrap: 3-LAYER WRAP - Profore Lite LF 3 Multilayer Compression Bandaging System 3 x Per Week/30 Days Discharge Instructions: Apply 3 multi-layer wrap as prescribed. Wound #11 - Lower Leg Wound Laterality: Left, Lateral Topical: Keystone 3 x Per Week/30 Days Prim Dressing: Aquacel Extra Hydrofiber Dressing, 4x5 (in/in) ary 3 x Per Week/30 Days Secondary Dressing: ABD Pad 5x9 (in/in) 3 x Per Week/30 Days Discharge Instructions: Cover with ABD pad Compression Wrap: 3-LAYER WRAP - Profore Lite LF 3 Multilayer Compression Bandaging System 3 x Per Week/30 Days Discharge Instructions: Apply 3 multi-layer wrap as prescribed. Wound #12 - Ankle Wound Laterality: Left, Medial Topical: Keystone 3 x Per Week/30 Days Prim Dressing: Aquacel  Extra Hydrofiber Dressing, 4x5 (in/in) ary 3 x Per Week/30 Days DENIAH, SAIA ([id number]) [id number] Page 9 of 17 Secondary Dressing: ABD Pad 5x9 (in/in) 3 x Per Week/30 Days Discharge Instructions: Cover with ABD pad Compression Wrap: 3-LAYER WRAP - Profore Lite LF 3 Multilayer Compression Bandaging System 3 x Per Week/30 Days Discharge  Instructions: Apply 3 multi-layer wrap as prescribed. Electronic Signature(s) Signed: 05/31/2023 12:49:28 PM By: Geralyn Corwin DO Entered By: Geralyn Corwin on 05/30/2023 12:02:40 -------------------------------------------------------------------------------- Problem List Details Patient Name: Date of Service: Laura Santiago, Laura Peters [name] 05/30/2023 10:00 A M Medical Record Number: [id number] Patient Account Number: [id number] Date of Birth/Sex: Treating RN: [birth year] (77 y.o. Skip Mayer Primary Care Provider: Marisue Ivan Other Clinician: Betha Loa Referring Provider: Treating Provider/Extender: Greta Doom in Treatment: 29 Active Problems ICD-10 Encounter Code Description Active Date MDM Diagnosis [phone] Non-pressure chronic ulcer of other part of left lower leg with fat layer exposed11/23/2022 No Yes 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 11/02/2021 No Yes Z79.01 Long term (current) use of anticoagulants 07/06/2021 No Yes I10 Essential (primary) hypertension 07/06/2021 No Yes 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 07/06/2021 No Yes Inactive Problems ICD-10 Code Description Active Date Inactive Date S81.802A Unspecified open wound, left lower leg, initial encounter 07/06/2021 07/06/2021 S91.101A Unspecified open wound of right great toe without damage to nail, initial encounter 08/24/2021 08/24/2021 S91.104A Unspecified open wound of right lesser toe(s) without damage to nail, initial encounter 08/24/2021 08/24/2021 CARRISSA, TAITANO ([id number]) [id number] Page 10 of 17 Resolved Problems ICD-10 Code Description Active Date Resolved Date S91.104D Unspecified open wound of right lesser toe(s) without damage to nail, subsequent 08/31/2021 08/31/2021 encounter S91.201D Unspecified open wound of right great toe with damage to nail, subsequent encounter 08/31/2021 08/31/2021 I87.311 Chronic venous hypertension (idiopathic) with ulcer of right lower extremity 08/30/2022 08/30/2022 L03.115 Cellulitis of right lower limb 12/27/2022 12/27/2022 S81.801A Unspecified open wound, right lower leg, initial encounter 01/17/2023 01/17/2023 Electronic Signature(s) Signed: 05/31/2023 12:49:28 PM By: Geralyn Corwin DO Entered By: Geralyn Corwin on 05/30/2023 11:20:20 -------------------------------------------------------------------------------- Progress Note Details Patient Name: Date of Service: Laura Muscat [name] 05/30/2023 10:00 A M Medical Record Number: [id number] Patient Account Number: [id number] Date of Birth/Sex: Treating RN: Dec 03, 1946 (77 y.o. Skip Mayer Primary Care Provider: Marisue Ivan Other Clinician: Betha Loa Referring Provider: Treating Provider/Extender: Greta Doom in Treatment: 89 Subjective Chief Complaint Information obtained from Patient Left lower extremity wound Right toe wounds Left upper lateral thigh wounds History of Present Illness (HPI) Admission 7/27 Ms. Aniayah Alaniz is a 77 year old female with a past medical history of ADHD, metastatic breast cancer, stage IV chronic kidney disease, history of DVT on Xarelto and chronic venous insufficiency that presents to the clinic for a chronic left lower extremity wound. She recently moved to [facility] 4 days ago. She was being followed by wound care center in [place]. She reports a 10-year history of wounds to her left lower extremity that eventually do heal with  debridement and compression therapy. She states that the current wound reopened 4 months ago and she is using Vaseline and Coban. She denies signs of infection. 8/3; patient presents for 1 week follow-up. She reports no issues or complaints today. She states she had vascular studies done in the last week. She denies signs of infection. She brought her little service dog with her today. 8/17; patient presents for follow-up. She has missed her last clinic appointment. She states she took the wrap off and attempted to rewrap her leg. She is having difficulty with transportation. She has her service dog with her today. Overall she feels well and reports improvement in wound healing. She denies signs of infection. She reports owning an old Velcro wrap compression and has this at her living facility  9/14; patient presents for follow-up. Patient states that over the past 2 to 3 weeks she developed toe wounds to her right foot. She attributes this to tight fitting shoes. She subsequently developed cellulitis in the right leg and has been treated by doxycycline by her oncologist. She reports improvement in symptoms however continues to have some redness and swelling to this leg. T the left lower extremity patient has been having her wraps changed with home health twice weekly. She states that the [facility] is not helping control o the drainage. Other than that she has no issues or complaints today. She denies signs of infection to the left lower extremity. 9/21; patient presents for follow-up. She reports seeing infectious disease for her cellulitis. She reports no further management. She has home health that changes the wraps twice weekly. She has no issues or complaints today. She denies signs of infection. Laura Santiago, Laura Santiago ([id number]) [id number] Page 11 of 17 10/5; patient presents for follow-up. She has no issues or complaints today. She denies signs of infection. She  states that the right great toe has not been dressed by home health. 10/12; patient presents for follow-up. She has no issues or complaints today. She reports improvement in her wound healing. She has been using silver alginate to the right great toe wound. She denies signs of infection. 10/26; patient presents for follow-up. Home health did not have sorbact so they continued to use Hydrofera Blue under the wrap. She has been using silver alginate to the great toe wound however she did not have a dressing in place today. She currently denies signs of infection. 11/2; patient presents for follow-up. She has been using sorb act under the compression wrap. She reports using silver alginate to the toe wound again she does not have a dressing in place. She currently denies signs of infection. 11/23; patient presents for follow-up. Unfortunately she has missed her last 2 clinic appointments. She was last seen 3 weeks ago. She did her own compression wrap with Kerlix and Coban yesterday after seeing vein and vascular. She has not been dressing her right great toe wound. She currently denies signs of infection. 11/30; patient presents for 1 week follow-up. She states she changed her dressing last week prior to home health and use sorb act with Dakin's and Hydrofera Blue. Home health has changed the dressing as well and they have been using sorbact. T oday she reports increased redness to her right lower extremity. She has a history of cellulitis to this leg. She has been using silver alginate to the right great toe. Unfortunately she had an episode of diarrhea prior to coming in and had feces all over the right leg and to the wrap of her left leg. 12/7; patient presents for 1 week follow-up. She states that home health did not come out to change the dressing and she took it off yesterday. It is unclear if she is dressing the right toe wound. She denies signs of infection. 12/14; patient presents for 1 week  follow-up. She has no issues or complaints today. 12/21; patient presents for follow-up. She has no issues or complaints today. She denies signs of infection. 12/28/2021; patient presents for follow-up. She was hospitalized for sepsis secondary to right lower extremity cellulitis On 12/23. She states she is currently at a SNF. She states that she was started on doxycycline this morning for her right great toe swelling and redness. She is not sure what dressings have been done to her left  lower extremity for the past 3 weeks. She says its been mainly gauze with an Ace wrap. 1/25; patient presents for follow-up. She is still residing in a skilled nursing facility. She reports mild pain to the left lower extremity wound bed. She states she is going to see a podiatrist soon. 2/8; patient presents for follow-up. She has moved back to her residential community from her skilled nursing facility. She has no issues or complaints today. She denies signs of systemic infections. 2/15; patient presents for follow-up. He has no issues or complaints today. She denies systemic signs of infection. 2/22; patient presents for follow-up. She has no issues or complaints today. She denies signs of infection. 3/1; patient presents for follow-up. She states that home health came out the day after she was seen in our clinic and yesterday to do the wrap change. She denies signs of infection. She reports excoriated skin on the ankle. 3/8; patient presents for follow-up. She has no issues or complaints today. She denies signs of infection. 3/15; patient presents for follow-up. Home health has been coming out to change the dressings. She reports more tenderness to the wound site. She denies purulent drainage, increased warmth or erythema to the area. 4/5; patient presents for follow-up. She has missed her last 2 clinic appointments. I have not seen her in 3 weeks. She was recently hospitalized for altered mental status. She was  involuntarily committed. She was evaluated by psychiatry and deemed to have competency. There was no specific cause of her altered mental status. It was concluded that her physical and mental health were declining due to her chronic medical conditions. Currently home health has been coming out for dressing changes. Patient has also been doing her own dressing changes. She reports more skin breakdown to the periwound and now has a new wound. She denies fever/chills. She reports continued tenderness to the wound site. 4/12; patient with significant venous insufficiency and a large wound on her left lower leg taking up about 80% of the circumference of her lower leg. Cultures of this grew MRSA and Pseudomonas. She had completed a course of ciprofloxacin now is starting doxycycline. She has been using Dakin's wet-to-dry and a Tubigrip. She has home health twice a week and we change it once. 4/19; patient presents for follow-up. She completed her course of doxycycline. She has been using Dakin's wet-to-dry dressing and Tubigrip. Home health changes the dressing twice weekly. Currently she has no issues or complaints. 4/26; patient presents for follow-up. At last clinic visit orders for home health were Iodosorb under compression therapy. Unfortunately they did not have the dressing and have been using Dakin's and gentamicin under the wrap. Patient currently denies signs of infection. She has no issues or complaints today. 5/3; patient presents for follow-up. Again Iodosorb has not been used under the compression therapy when home health comes out to change the wrap and dressing. They have been using Sorbact. It is unclear why this is happening since we send orders weekly to the agency. She denies signs of infection. Patient has not purchased the [place] antibiotics. We reached out to the company and they said they have been trying to contact her on a regular basis. We gave the patient the number to call to  order the medication. 5/10; patient presents for follow-up. She has no issues or complaints today. Again home health has not been using Iodosorb. Mepilex was on the wound bed. No other dressings noted. She brought in her Keystone antibiotics. She denies  signs of infection. 5/17; patient presents for follow-up. Home health has come out twice since she was last seen. Joint well she has been using Keystone antibiotic with Sorbact under the compression wrap. She has no issues or complaints today. She denies signs of infection. 5/24; patient presents for follow-up. We have been using Keystone antibiotics with Sorbact under compression therapy. She is tolerating the treatment well. She is reporting improvement in wound healing. She denies signs of infection. 5/31; patient presents for follow-up. We continue to do [facility] antibiotics with Sorbact under compression therapy. She continues to report improvement in wound healing. Home health comes out and changes the dressing once weekly. 05-17-2022 upon evaluation today patient appears to be doing better in regard to her wound especially compared to the last time I saw her. Fortunately I do think that she is seeing improvements. With that being said I do believe that she may be benefit from sharp debridement today to clear away some of the necrotic debris I discussed that with her as well. She is an amendable to that plan. Otherwise she is very pleased with how the [facility] E[facility]ia is doing for her. 6/14; patient presents for follow-up. We have been using Keystone antibiotic with Sorbact and absorbent dressings under 3 layer compression. She has no issues or complaints today. She reports improvement in wound healing. She denies signs of infection. 6/21; patient presents for follow-up. We are continuing with [facility] antibiotic and Sorbact under 3 layer compression. Patient has no complaints. Continued wound healing is happening. She denies signs of infection. Laura Santiago, Laura Santiago ([id number]) [id number] Page 12 of 17 6/28; patient presents for follow-up. We have been using Keystone antibiotic with Sorbact under 3 layer compression. Usually home health comes out and changes the dressing twice a week. Unfortunately they did not go out to change the dressing. It is unclear why. Patient did not call them. She currently denies signs of infection. 7/5; patient presents for follow-up. We have been using Keystone antibiotic with calcium alginate under 3 layer compression. She reports improvement in wound healing. She denies signs of infection. Home health has come out to do dressing changes twice this past week. 7/12; patient presents for follow-up. We have been using Keystone antibiotic with calcium alginate under 3 layer compression. Patient states that home health came out once last week to change the dressing. She reports improvement in wound healing. She currently denies signs of infection. 7/19; patient presents for follow-up. We have been using Keystone antibiotic with calcium alginate under 3 layer compression. Home health came out once last week to change the dressing. She has no issues or complaints today. She denies signs of infection. 8/2; patient presents for follow-up. We have been using Keystone antibiotic with calcium alginate under 3 layer compression. Unfortunately she missed her appointment last week and home health did not come out to do dressing changes. Patient currently denies signs of infection. 8/9; patient presents for follow-up. We have been using Keystone with calcium alginate under 3 layer compression. She states that home health came out once last week. She currently denies signs of infection. Her wrap was completely wet. She states she was cleaning the top of the leg and water soaked down into the wrap. 8/16; patient presents for follow-up. We have been using Keystone with calcium alginate under 3 layer compression.  She states that home health came out twice last week. She has no issues or complaints today. 8/23; patient presents for follow-up. He has been using [place] with  calcium alginate under 3 layer compression. Home health came out twice last week. She denies signs of infection. 8/30; patient presents for follow-up. We have been using Keystone with calcium alginate under 3 layer compression. Home health came out once last week to change the dressing. Patient reports improvement in wound healing. She states she is almost done with her chemotherapy infusions and has 1 more left. 9/13; patient presents for follow-up. She has lost the capsules to her [facility] The antibiotic which I believe is the vancomycin pills. She has her Zosyn powder today. We have been using Keystone antibiotic ointment with calcium alginate under 3 layer compression. She is concerned about systemic infection however her vitals are stable and there is no surrounding soft tissue infection. She would like to remain a patient in our wound care center however would like a second opinion for her wound care at another facility. She asked to be referred to [facility] wound care center. 9/20; patient presents for follow-up. She found her vancomycin capsules and brought in her complete Keystone antibiotic ointment set today. Unfortunately she has developed skin breakdown and Erythema to the right lower extremity With increased swelling. She states she went to a pow wow Over the weekend and was on her feet for extended periods of time. She saw her oncologist yesterday who prescribed her doxycycline for her right lower extremity erythema. 9/27; patient presents for follow-up. We have been using Keystone antibiotic with Aquacel under 3 layer compression to the lower extremities bilaterally. When home health came and changed the wrap she secretly put coffee into the spray mix along with [facility] antibiotic on her leg thinking the acidic component would better  activate the zoysn (sonething she discussed with her microbiologist brother). She has reported improvement in wound healing. 10/4; patient presents for follow-up. She has no issues or complaints today. We have been doing Aquacel and keystone under 3 layer compression to the lower extremities bilaterally. This morning she took the right lower extremity wrap off as it was uncomfortable. She has no open wounds to this leg. 10/11; patient presents for follow-up. We have been doing Aquacel with Keystone antibiotic ointment under 3 layer compression to the left lower extremity. She developed a small blister to the anterior aspect of the left leg noticed when the wrap was taken off on intake. She currently denies signs of infection. 10/18; patient presents for follow-up. We have been doing Aquacel with Keystone antibiotic ointment under 3 layer compression to the left lower extremity. There has been continued improvement in wound healing. She denies signs of infection. 10/25; patient arrives for treatment of venous insufficiency ulcers on her left lower leg both lateral and medial are remanence of apparently a circumferential wound. Much improved. We are using topicals Keystone and Aquacel Ag under 3 layer compression we continue to make good progress. The patient talk to me at some length with regards to different things she has on her forehead and her Peri orbital area for which she is apparently applying [place]. She feels that what ever we are treating on her wounds is a more systemic problem. I really was not able to get a handle on what she is talking about however I did caution her not to put the [place] in her eyes. 11/1; her wounds continue to improve she is using Keystone and Aquacel Ag G under 3 layer compression. Our intake nurse notes erythema and edema in the right leg. The patient has a litany of concerns with regards to a rash  on her forehead or ears and other systemic complaints. She has an  appointment with dermatology on November 11 11/8; patient presents for follow-up. We have been using Keystone and Aquacel under 4-layer compression. She has no issues or complaints today. She reports improvement in wound healing. 11/15; patient presents for follow-up. We have been using Aquacel with Keystone antibiotic under 3 layer compression. Patient continues improvement in wound healing. 12/6; patient presents for follow-up. We have been using Aquacel with Keystone antibiotic ointment under 3 layer compression. Wounds appear well-healing. 12/13; patient presents for follow-up. We have been using Aquacel with Keystone antibiotic under 3 layer compression. She has no issues or complaints today. 12/27 left lateral medial ankle. Superficial wounds remain there is significantly improved we are using Keystone backed with Zetuvit under 4-layer compression 1/3; patient presents for follow-up. Her wounds appear well-healing. We have been using Aquacel Ag with Keystone antibiotic ointment under compression therapy. This should be a 3 layer compression. 1/17; patient presents for follow-up. Her wounds on her left lower extremity are well-healing. We are using Aquacel Ag and Keystone antibiotic ointment under compression therapy. She missed her last clinic appointment. She states that the wrap has been changed twice weekly since she was last seen. Unfortunately she has developed increased warmth and redness to the right lower extremity consistent with cellulitis. She states this started a few days ago. 1/24; patient presents for follow-up. Her wounds on the left lower extremity are well-healing with Aquacel and Keystone antibiotic ointment under compression therapy. I prescribed Keflex at last clinic visit for cellulitis of the right lower extremity. Unfortunately this has not resolved. She did not follow-up with her PCP for contact our office about her symptoms. 1/31; patient presents for follow-up. We  have been using Aquacel and Keystone antibiotic ointment under compression therapy to the left lower extremity. Wounds appear well-healing. She has been taking clindamycin for the past week. Her symptoms have improved slightly with a decrease in erythema and warmth to the right lower extremity. She says her pain level has improved. However Symptoms have not completely resolved. She denies fever/chills, nausea/vomiting. 2/7; patient presents for follow-up. We have been using Aquacel Ag with Keystone antibiotic ointment under compression therapy to the left lower extremity. Laura Santiago, Laura Santiago ([id number]) [id number] Page 13 of 17 For the past week home health has not been using Keystone antibiotic ointment. She continues to take clindamycin. Her symptoms have improved greatly with the decrease in erythema and warmth to the right lower extremity. She denies systemic signs of infection. She has an area of skin breakdown to the right anterior leg. 2/14; Patient presents for follow-up. She has been using Hydrofera Blue to the right anterior leg under Tubigrip. It looks like Hydrofera Blue is also being used with Keystone to the left lower extremity under compression therapy. Order is for Aqualcell Ag. Overall wounds appear well healing. She has no issues or complaints. 2/21; patient presents for follow-up. We have been using Hydrofera Blue under 3 layer compression to the lower extremities bilaterally. Her right lower extremity wounds have healed. She has a small open wound remaining to her left lateral leg. 2/28; patient presents for follow-up. We have been using Hydrofera Blue under 3 layer compression. Home health has changed the dressing to the left lower extremity however started the wrap at the ankle. 3/6; patient presents for follow-up. We have been using PolyMem with antibiotic ointment under 3 layer compression to the left lower extremity. She has no issues or  complaints today. 3/12; patient presents for follow-up. We have been using Aquacel Ag under 3 layer compression to the left lower extremity. Her wound is healed. She has juxta lite compression wraps at home. Readmission: 03-13-2023 upon evaluation today patient appears to be doing poorly in regard to her lower extremities. Specifically it is the left lower extremity at this point that is causing her problems she has multiple wounds open. With that being said she does not sound like she has been wearing her compression stockings on a regular basis she has previously seen Dr. Mikey Bussing. With that being said upon evaluation today she tells me that since the wounds have reopened that she has been having a lot of discomfort she also tells me that she went back to using some of the dressings that she had leftover at home she does not know exactly what everything was. Nonetheless she also tells me that she has made an appointment with a dermatologist in town and has to see them tomorrow therefore she is not going to allow [place] to wrap her legs today since "that did not work anyway". With that being said obviously she did improve last time we got her wound healed we order her juxta lite compression wraps again it does not sound like she has been using those appropriately at home. 4/10; patient presents for follow-up. Unfortunately she reopened last week and was seen by [facility]. At that time the patient declined in office wraps. She states she is wearing her juxta lite compression wraps however she did not have these on today. 4/17; patient presents for follow-up. At last clinic visit a PCR culture was done that grew Staph aureus and Enterobacter cola CA and Enterococcus bacillus. Keystone antibiotic ointment was ordered. She has not received this yet. We have been using antibiotic ointment with Hydrofera Blue under compression therapy. There is been improvement in her wound healing. She has no issues or complaints  today. 4/24; patient presents for follow-up. She received Keystone antibiotic ointment and this was started 3 days ago when home health came to change the wrap. She has no issues or complaints today. 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 She has been using Keystone Aquacel Ag under compression. She tells me she is recently started on chemotherapy for metastatic breast cancer with "bone mets" I am not exactly sure at this point what drugs she is receiving. 5/22; patient presents for follow-up. We have been using Keystone antibiotic ointment and Aquacel Ag under compression therapy. Wounds are improved. 5/29; patient presents for follow-up. We have been using Aquacel with Keystone antibiotic ointment under 3 layer compression. Patient has no issues or complaints today. Overall wounds appear well-healing. 6/5; we have been following this patient for wounds on the left leg. We have been using Keystone and Aquacel under 3 layer compression on the left. On the right leg she is supposed to be using a juxta lite She comes in today with the left leg wound stable to improved however the right leg was very swollen with threatening ulceration and weeping edema fluid. 6/12; patient presents for follow-up. We have been using Keystone and Aquacel under 3 layer compression on the left. She had a compression wrap placed on the right at last clinic visit due to threatening ulcerations and weeping however home health did not place a new wrap. She had no compression garment  on today. She has scabs to previous open weeping sites. 6/19; patient presents for follow-up. We have been using Keystone antibiotic ointment with Aquacel Ag under 3 layer compression to the left lower extremity. Her wounds are smaller. She is moving to [place] and we will transfer notes to her  new doctor. Our facility has already been in touch with them to arrange for wound care. Objective Constitutional Vitals Time Taken: 10:12 AM, Height: 66 in, Weight: 153 lbs, BMI: 24.7, Temperature: 98.2 F, Pulse: 64 bpm, Respiratory Rate: 18 breaths/min, Blood Pressure: 138/71 mmHg. General Notes: Left lower extremity: T the distal aspect there are are several scattered open wounds with granulation tissue and nonviable tissue. Good o edema control. No signs of surrounding infection including increased warmth, erythema or purulent drainage. Integumentary (Hair, Skin) Wound #10 status is Open. Original cause of wound was Gradually Appeared. The date acquired was: 03/04/2023. The wound has been in treatment 11 weeks. The wound is located on the Left,Posterior Lower Leg. The wound measures 0.3cm length x 0.9cm width x 0.1cm depth; 0.212cm^2 area and 0.021cm^3 volume. There is Fat Layer (Subcutaneous Tissue) exposed. There is a medium amount of serosanguineous drainage noted. The wound margin is distinct with the outline attached to the wound base. There is small (1-33%) red granulation within the wound bed. There is a large (67-100%) amount of necrotic tissue within the wound bed. Wound #11 status is Open. Original cause of wound was Gradually Appeared. The date acquired was: 03/04/2023. The wound has been in treatment 11 weeks. Laura Santiago, Laura Santiago ([id number]) [id number] Page 14 of 17 The wound is located on the Left,Lateral Lower Leg. The wound measures 4.8cm length x 2.9cm width x 0.2cm depth; 10.933cm^2 area and 2.187cm^3 volume. There is Fat Layer (Subcutaneous Tissue) exposed. There is a small amount of serosanguineous drainage noted. The wound margin is distinct with the outline attached to the wound base. There is large (67-100%) red granulation within the wound bed. There is a small (1-33%) amount of necrotic tissue within the wound bed including Adherent  Slough. Wound #12 status is Open. Original cause of wound was Gradually Appeared. The date acquired was: 03/04/2023. The wound has been in treatment 11 weeks. The wound is located on the Left,Medial Ankle. The wound measures 1.8cm length x 0.8cm width x 0.1cm depth; 1.131cm^2 area and 0.113cm^3 volume. There is Fat Layer (Subcutaneous Tissue) exposed. There is a medium amount of serosanguineous drainage noted. The wound margin is distinct with the outline attached to the wound base. There is small (1-33%) pink granulation within the wound bed. There is a large (67-100%) amount of necrotic tissue within the wound bed including Adherent Slough. Wound #13 status is Healed - Epithelialized. Original cause of wound was Not Known. The date acquired was: 02/14/2023. The wound has been in treatment 8 weeks. The wound is located on the Left,Lateral,Posterior Lower Leg. The wound measures 0cm length x 0cm width x 0cm depth; 0cm^2 area and 0cm^3 volume. There is Fat Layer (Subcutaneous Tissue) exposed. There is a none present amount of drainage noted. There is no granulation within the wound bed. There is no necrotic tissue within the wound bed. 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 I  debrided nonviable tissue. I recommended continuing the course with [facility] antibiotic ointment and Aquacel Ag under 3 layer compression. Since she is moving to [place] on Sunday I recommended she take the compression wrap off after she has had it on for 7 days. She can use her compression garments along with Aquacel Ag and Keystone antibiotic ointment daily till she is seen at  her new wound care center. The main success she has had for her wound healing has been compounded antibiotic ointments based on culture result. She has a refill of this. Along with Aquacel Ag under 3 layer compression. Her wounds have healed before to the left lower extremity however reopened because she did not use her compression garments. We have discussed the importance of edema control after her wounds heal on a daily basis in order to not reopen. Procedures Wound #10 Pre-procedure diagnosis of Wound #10 is a Venous Leg Ulcer located on the Left,Posterior Lower Leg .Severity of Tissue Pre Debridement is: Fat layer exposed. There was a Excisional Skin/Subcutaneous Tissue Debridement with a total area of 0.21 sq cm performed by Geralyn Corwin, MD. With the following instrument(s): Curette to remove Viable and Non-Viable tissue/material. Material removed includes Subcutaneous Tissue and Slough and. A time out was conducted at 10:40, prior to the start of the procedure. A Minimum amount of bleeding was controlled with Pressure. The procedure was tolerated well. Post Debridement Measurements: 0.3cm length x 0.9cm width x 0.1cm depth; 0.021cm^3 volume. Character of Wound/Ulcer Post Debridement is improved. Severity of Tissue Post Debridement is: Fat layer exposed. Post procedure Diagnosis Wound #10: Same as Pre-Procedure Pre-procedure diagnosis of Wound #10 is a Venous Leg Ulcer located on the Left,Posterior Lower Leg . There was a Three Layer Compression Therapy Procedure with a pre-treatment ABI of 1.5 by Betha Loa. Post procedure Diagnosis Wound #10: Same as Pre-Procedure Wound #11 Pre-procedure diagnosis of Wound #11 is a Venous Leg Ulcer located on the Left,Lateral Lower Leg .Severity of Tissue Pre Debridement is: Fat layer exposed. There was a Excisional Skin/Subcutaneous Tissue Debridement with a total area of 10.93 sq cm performed by Geralyn Corwin, MD. With the  following instrument(s): Curette to remove Viable and Non-Viable tissue/material. Material removed includes Subcutaneous Tissue and Slough and. A time out was conducted at 10:41, prior to the start of the procedure. A Minimum amount of bleeding was controlled with Pressure. The procedure was tolerated well. Post Debridement Measurements: 4.8cm length x 2.9cm width x 0.2cm depth; 0.021cm^3 volume. Character of Wound/Ulcer Post Debridement is improved. Severity of Tissue Post Debridement is: Fat layer exposed. Post procedure Diagnosis Wound #11: Same as Pre-Procedure Wound #12 Pre-procedure diagnosis of Wound #12 is a Venous Leg Ulcer located on the Left,Medial Ankle .Severity of Tissue Pre Debridement is: Fat layer exposed. There was a Excisional Skin/Subcutaneous Tissue Debridement with a total area of 1.13 sq cm performed by Geralyn Corwin, MD. With the following instrument(s): Curette to remove Viable and Non-Viable tissue/material. Material removed includes Subcutaneous Tissue and Slough and. A time out was conducted at 10:43, prior to the start of the procedure. A Minimum amount of bleeding was controlled with Pressure. The procedure was tolerated well. Post Debridement Measurements: 1.8cm length x 0.8cm width x 0.1cm depth; 0.021cm^3 volume. Character of Wound/Ulcer Post Debridement is improved. Severity of Tissue Post Debridement is: Fat layer exposed. Post procedure Diagnosis Wound #12: Same as Pre-Procedure Plan MARIANNA, CID ([id number]) [id number] Page 15 of 17 Follow-up Appointments: Return Appointment in 1 week. Nurse Visit  as needed Home Health: Home Health Company: [facility] [phone] **Please direct any NON-WOUND related issues/requests for orders to patient's Primary Care Physician. **If current dressing causes regression in wound condition, may D/C ordered dressing product/s and apply Normal Saline Moist Dressing daily until next Wound  Healing Center or Other MD appointment. **Notify Wound Healing Center of regression in wound condition at [phone]. Bathing/ Shower/ Hygiene: May shower with wound dressing protected with water repellent cover or cast protector. Edema Control - Lymphedema / Segmental Compressive Device / Other: Optional: One layer of unna paste to top of compression wrap (to act as an anchor). Tubigrip single layer applied. - Tubi D right leg Elevate, Exercise Daily and Avoid Standing for Long Periods of Time. Elevate legs to the level of the heart and pump ankles as often as possible Elevate leg(s) parallel to the floor when sitting. DO YOUR BEST to sleep in the bed at night. DO NOT sleep in your recliner. Long hours of sitting in a recliner leads to swelling of the legs and/or potential wounds on your backside. Additional Orders / Instructions: Follow Nutritious Diet and Increase Protein Intake WOUND #10: - Lower Leg Wound Laterality: Left, Posterior Topical: Keystone 3 x Per Week/30 Days Prim Dressing: Aquacel Extra Hydrofiber Dressing, 4x5 (in/in) 3 x Per Week/30 Days ary Secondary Dressing: ABD Pad 5x9 (in/in) 3 x Per Week/30 Days Discharge Instructions: Cover with ABD pad Com pression Wrap: 3-LAYER WRAP - Profore Lite LF 3 Multilayer Compression Bandaging System 3 x Per Week/30 Days Discharge Instructions: Apply 3 multi-layer wrap as prescribed. WOUND #11: - Lower Leg Wound Laterality: Left, Lateral Topical: Keystone 3 x Per Week/30 Days Prim Dressing: Aquacel Extra Hydrofiber Dressing, 4x5 (in/in) 3 x Per Week/30 Days ary Secondary Dressing: ABD Pad 5x9 (in/in) 3 x Per Week/30 Days Discharge Instructions: Cover with ABD pad Com pression Wrap: 3-LAYER WRAP - Profore Lite LF 3 Multilayer Compression Bandaging System 3 x Per Week/30 Days Discharge Instructions: Apply 3 multi-layer wrap as prescribed. WOUND #12: - Ankle Wound Laterality: Left, Medial Topical: Keystone 3 x Per Week/30 Days Prim  Dressing: Aquacel Extra Hydrofiber Dressing, 4x5 (in/in) 3 x Per Week/30 Days ary Secondary Dressing: ABD Pad 5x9 (in/in) 3 x Per Week/30 Days Discharge Instructions: Cover with ABD pad Com pression Wrap: 3-LAYER WRAP - Profore Lite LF 3 Multilayer Compression Bandaging System 3 x Per Week/30 Days Discharge Instructions: Apply 3 multi-layer wrap as prescribed. 1. In office sharp debridement 2. Aquacel Ag with Keystone antibiotic ointment under 3 layer compressionleft lower extremity 3. Follow-up with [place] as needed Electronic Signature(s) Signed: 05/31/2023 12:49:28 PM By: Geralyn Corwin DO Entered By: Geralyn Corwin on 05/30/2023 12:00:58 -------------------------------------------------------------------------------- ROS/PFSH Details Patient Name: Date of Service: Laura Santiago, Laura [name] 05/30/2023 10:00 A M Medical Record Number: [id number] Patient Account Number: [id number] Date of Birth/Sex: Treating RN: [birth year] (77 y.o. Skip Mayer Primary Care Provider: Marisue Ivan Other Clinician: Betha Loa Referring Provider: Treating Provider/Extender: Greta Doom in Treatment: 5 Information Obtained From Patient Eyes Medical History: Negative for: Cataracts; Glaucoma; Optic Neuritis ELLAWYN, WOGAN ([id number]) [id number] Page 16 of 17 Ear/Nose/Mouth/Throat Medical History: Negative for: Chronic sinus problems/congestion; Middle ear problems Hematologic/Lymphatic Medical History: Negative for: Anemia; Hemophilia; Human Immunodeficiency Virus; Lymphedema; Sickle Cell Disease Respiratory Medical History: Negative for: Aspiration; Asthma; Chronic Obstructive Pulmonary Disease (COPD); Pneumothorax; Sleep Apnea; Tuberculosis Cardiovascular Medical History: Positive for: Hypertension Negative for: Angina; Arrhythmia; Congestive Heart Failure; Coronary Artery Disease; Deep Vein Thrombosis; Hypotension; Myocardial  Infarction; Peripheral Arterial Disease; Peripheral Venous Disease; Phlebitis; Vasculitis Gastrointestinal Medical History: Negative for: Cirrhosis ; Colitis; Crohns; Hepatitis A; Hepatitis B; Hepatitis C Endocrine Medical History: Negative for: Type I Diabetes; Type II Diabetes Genitourinary Medical History: Negative for: End Stage Renal Disease Immunological Medical History: Negative for: Lupus Erythematosus; Raynauds; Scleroderma Integumentary (Skin) Medical History: Negative for: History of Burn; History of pressure wounds Musculoskeletal Medical History: Positive for: Osteoarthritis Negative for: Gout; Rheumatoid Arthritis; Osteomyelitis Oncologic Medical History: Positive for: Received Chemotherapy; Received Radiation Past Medical History Notes: breast cancer Immunizations Pneumococcal Vaccine: Received Pneumococcal Vaccination: No Implantable Devices None Family and Social History 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 05/30/2023 Medical Record Number: [id number] Patient Account Number: [id number] Date of Birth/Sex: Treating RN: 08-May-1946 (77 y.o. 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 Facility Procedures : CPT4 Code: [id number] Description: 11042 - DEB SUBQ TISSUE 20 SQ CM/< ICD-10 Diagnosis Description 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 Z79.01  Long term (current) use of anticoagulants I10 Essential (primary) hypertension Modifier: sed Quantity: 1 Physician Procedures : CPT4 Code Description Modifier [id number] 11042 - WC PHYS SUBQ TISS 20 SQ CM ICD-10 Diagnosis Description 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.312 Chronic venous hypertension (idiopathic) with ulcer  of left lower extremity Z79.01 Long term (current) use of anticoagulants I10 Essential (primary) hypertension Quantity: 1 Electronic Signature(s) Signed: 05/31/2023 12:49:28 PM By: Geralyn Corwin DO Entered By: Geralyn Corwin on 05/30/2023 12:02:31

## 2023-06-04 NOTE — Progress Notes (Signed)
Laura Santiago ([id number]) [id number] Page 1 of 12 Visit Report for 05/30/2023 Arrival Information Details Patient Name: Date of Service: Laura Santiago. 05/30/2023 10:00 A M Medical Record Number: [id number] Patient Account Number: [id number] Date of Birth/Sex: Treating RN: March 13, 1946 (77 y.o. Skip Mayer Primary Care Zohan Shiflet: Marisue Ivan Other Clinician: Betha Loa Referring Korayma Hagwood: Treating Kethan Papadopoulos/Extender: Greta Doom in Treatment: 42 Visit Information History Since Last Visit All ordered tests and consults were completed: No Patient Arrived: [name] Added or deleted any medications: No Arrival Time: 10:06 Any new allergies or adverse reactions: No Transfer Assistance: None Had a fall or experienced change in No Patient Identification Verified: Yes activities of daily living that may affect Secondary Verification Process Completed: Yes risk of falls: Patient Requires Transmission-Based No Signs or symptoms of abuse/neglect since last visito No Precautions: Hospitalized since last visit: No Patient Has Alerts: Yes Implantable device outside of the clinic excluding No Patient Alerts: PT HAS SERVICE cellular tissue based products placed in the center ANIMAL since last visit: ABI 07/11/21 Has Dressing in Place as Prescribed: Yes R) 1.16 L) 1.27 Has Compression in Place as Prescribed: Yes Pain Present Now: No Electronic Signature(s) Signed: 05/31/2023 5:12:55 PM By: Betha Loa Entered By: Betha Loa on 05/30/2023 10:11:59 -------------------------------------------------------------------------------- Clinic Level of Care Assessment Details Patient Name: Date of Service: Laura Santiago. 05/30/2023 10:00 A M Medical Record Number: [id number] Patient Account Number: [id number] Date of Birth/Sex: Treating RN: 1946/08/20 (77 y.o. Skip Mayer Primary Care Theodoro Koval: Marisue Ivan Other Clinician: Betha Loa Referring Lana Flaim: Treating Dickie Labarre/Extender: Greta Doom in Treatment: 58 Clinic Level of Care Assessment Items TOOL 1 Quantity Score []  - 0 Use when EandM and Procedure is performed on INITIAL visit ASSESSMENTS - Nursing Assessment / Reassessment []  - 0 General Physical Exam (combine w/ comprehensive assessment (listed just below) when performed on new pt. [address]REB[address]CA, Santiago ([id number]) [id number] Page 2 of 12 []  - 0 Comprehensive Assessment (HX, ROS, Risk Assessments, Wounds Hx, etc.) ASSESSMENTS - Wound and Skin Assessment / Reassessment []  - 0 Dermatologic / Skin Assessment (not related to wound area) ASSESSMENTS - Ostomy and/or Continence Assessment and Care []  - 0 Incontinence Assessment and Management []  - 0 Ostomy Care Assessment and Management (repouching, etc.) PROCESS - Coordination of Care []  - 0 Simple Patient / Family Education for ongoing care []  - 0 Complex (extensive) Patient / Family Education for ongoing care []  - 0 Staff obtains Chiropractor, Records, T Results / Process Orders est []  - 0 Staff telephones HHA, Nursing Homes / Clarify orders / etc []  - 0 Routine Transfer to another Facility (non-emergent condition) []  - 0 Routine Hospital Admission (non-emergent condition) []  - 0 New Admissions / Manufacturing engineer / Ordering NPWT Apligraf, etc. , []  - 0 Emergency Hospital Admission (emergent condition) PROCESS - Special Needs []  - 0 Pediatric / Minor Patient Management []  - 0 Isolation Patient Management []  - 0 Hearing / Language / Visual special needs []  - 0 Assessment of Community assistance (transportation, D/C planning, etc.) []  - 0 Additional assistance / Altered mentation []  - 0 Support Surface(s) Assessment (bed, cushion, seat, etc.) INTERVENTIONS - Miscellaneous []  - 0 External ear exam []  - 0 Patient Transfer (multiple  staff / Nurse, adult / Similar devices) []  - 0 Simple Staple / Suture removal (25 or less) []  - 0 Complex Staple / Suture removal (26 or more) []  - 0 Hypo/Hyperglycemic Management (do not  check if billed separately) []  - 0 Ankle / Brachial Index (ABI) - do not check if billed separately Has the patient been seen at the hospital within the last three years: Yes Total Score: 0 Level Of Care: ____ Electronic Signature(s) Signed: 05/31/2023 5:12:55 PM By: Betha Loa Entered By: Betha Loa on 05/30/2023 10:50:15 -------------------------------------------------------------------------------- Compression Therapy Details Patient Name: Date of Service: Laura Santiago [name] 05/30/2023 10:00 A M Medical Record Number: [id number] Patient Account Number: [id number] Date of Birth/Sex: Treating RN: [birth year] (77 y.o. Skip Mayer Primary Care Genevive Printup: Marisue Ivan Other Clinician: Milea, Klink ([id number]) [id number] Page 3 of 12 Referring Jenevie Casstevens: Treating Mostafa Yuan/Extender: Greta Doom in Treatment: 99 Compression Therapy Performed for Wound Assessment: Wound #10 Left,Posterior Lower Leg Performed By: Farrel Gordon, [name], Compression Type: Three Layer Pre Treatment ABI: 1.5 Post Procedure Diagnosis Same as Pre-procedure Electronic Signature(s) Signed: 05/31/2023 5:12:55 PM By: Betha Loa Entered By: Betha Loa on 05/30/2023 10:49:26 -------------------------------------------------------------------------------- Encounter Discharge Information Details Patient Name: Date of Service: Laura Santiago [name] 05/30/2023 10:00 A M Medical Record Number: [id number] Patient Account Number: [id number] Date of Birth/Sex: Treating RN: 09-13-1946 (77 y.o. Skip Mayer Primary Care [name] [name]: Marisue Ivan Other Clinician: Betha Loa Referring Eliel Dudding: Treating Hurley Blevins/Extender: Greta Doom in Treatment: 5 Encounter Discharge Information Items Post Procedure Vitals Discharge Condition: Stable Temperature (F): 98.2 Ambulatory Status: Walker Pulse (bpm): 64 Discharge Destination: Home Respiratory Rate (breaths/min): 18 Transportation: Other Blood Pressure (mmHg): 138/71 Accompanied By: self Schedule Follow-up Appointment: Yes Clinical Summary of Care: Electronic Signature(s) Signed: 05/31/2023 5:12:55 PM By: Betha Loa Entered By: Betha Loa on 05/30/2023 11:41:57 -------------------------------------------------------------------------------- Lower Extremity Assessment Details Patient Name: Date of Service: Laura Santiago. 05/30/2023 10:00 A M Medical Record Number: [id number] Patient Account Number: [id number] Date of Birth/Sex: Treating RN: 1946-03-09 (77 y.o. Skip Mayer Primary Care Aristotle Lieb: Marisue Ivan Other Clinician: Betha Loa Referring Ryota Treece: Treating Keylen Uzelac/Extender: Greta Doom in Treatment: [address], [place] M ([id number]) [id number] Page 4 of 12 Edema Assessment Assessed: [Left: Yes] [Right: No] Edema: [Left: Ye] [Right: s] Calf Left: Right: Point of Measurement: 36 cm From Medial Instep 38.7 cm Ankle Left: Right: Point of Measurement: 12 cm From Medial Instep 22 cm Vascular Assessment Pulses: Dorsalis Pedis Palpable: [Left:Yes] Electronic Signature(s) Signed: 05/31/2023 5:12:55 PM By: Betha Loa Signed: 06/04/2023 9:09:50 AM By: Elliot Gurney, BSN, RN, CWS, [name] RN, BSN Entered By: Betha Loa on 05/30/2023 10:25:11 -------------------------------------------------------------------------------- Multi Wound Chart Details Patient Name: Date of Service: Laura Santiago [name] 05/30/2023 10:00 A M Medical Record Number: [id number] Patient Account Number: [id number] Date of Birth/Sex: Treating RN: [birth year] (77 y.o. [name],  [name] Primary Care Ginamarie Banfield: Marisue Ivan Other Clinician: Betha Loa Referring Chestine Belknap: Treating Kolbe Delmonaco/Extender: Greta Doom in Treatment: 54 Vital Signs Height(in): 66 Pulse(bpm): 64 Weight(lbs): 153 Blood Pressure(mmHg): 138/71 Body Mass Index(BMI): 24.7 Temperature(F): 98.2 Respiratory Rate(breaths/min): 18 [10:Photos:] Left, Posterior Lower Leg Left, Lateral Lower Leg Left, Medial Ankle Wound Location: Gradually Appeared Gradually Appeared Gradually Appeared Wounding Event: Venous Leg Ulcer Venous Leg Ulcer Venous Leg Ulcer Primary Etiology: Hypertension, Osteoarthritis, ReceivedHypertension, Osteoarthritis, ReceivedHypertension, Osteoarthritis, Received Comorbid History: Chemotherapy, Received Radiation Chemotherapy, Received Radiation Chemotherapy, Received Radiation 03/04/2023 03/04/2023 03/04/2023 Date Acquired: 11 11 11  Weeks of Treatment: Open Open Open Wound Status: TWYLLA, ARCENEAUX ([id number]) [id number] Page 5 of 12 No No No Wound Recurrence: Yes No Yes Clustered Wound: 0.3x0.9x0.1 4.8x2.9x0.2 1.8x0.8x0.1 Measurements L  x W x D (cm) 0.212 10.933 1.131 A (cm) : rea 0.021 2.187 0.113 Volume (cm) : 99.60% -719.00% 62.50% % Reduction in Area: 99.60% -1532.10% 62.60% % Reduction in Volume: Full Thickness Without Exposed Full Thickness Without Exposed Full Thickness Without Exposed Classification: Support Structures Support Structures Support Structures Medium Small Medium Exudate Amount: Serosanguineous Serosanguineous Serosanguineous Exudate Type: red, brown red, brown red, brown Exudate Color: Distinct, outline attached Distinct, outline attached Distinct, outline attached Wound Margin: Small (1-33%) Large (67-100%) Small (1-33%) Granulation Amount: Red Red Pink Granulation Quality: Large (67-100%) Small (1-33%) Large (67-100%) Necrotic Amount: Fat Layer (Subcutaneous Tissue):  Yes Fat Layer (Subcutaneous Tissue): Yes Fat Layer (Subcutaneous Tissue): Yes Exposed Structures: Fascia: No Fascia: No Fascia: No Tendon: No Tendon: No Tendon: No Muscle: No Muscle: No Muscle: No Joint: No Joint: No Joint: No Bone: No Bone: No Bone: No N/A None None Epithelialization: Wound Number: 13 N/A N/A Photos: N/A N/A Left, Lateral, Posterior Lower Leg N/A N/A Wound Location: Not Known N/A N/A Wounding Event: Venous Leg Ulcer N/A N/A Primary Etiology: Hypertension, Osteoarthritis, ReceivedN/A N/A Comorbid History: Chemotherapy, Received Radiation 02/14/2023 N/A N/A Date Acquired: 8 N/A N/A Weeks of Treatment: Open N/A N/A Wound Status: No N/A N/A Wound Recurrence: No N/A N/A Clustered Wound: 0.1x0.1x0.1 N/A N/A Measurements L x W x D (cm) 0.008 N/A N/A A (cm) : rea 0.001 N/A N/A Volume (cm) : 100.00% N/A N/A % Reduction in Area: 100.00% N/A N/A % Reduction in Volume: Full Thickness Without Exposed N/A N/A Classification: Support Structures Medium N/A N/A Exudate Amount: Serosanguineous N/A N/A Exudate Type: red, brown N/A N/A Exudate Color: N/A N/A N/A Wound Margin: Medium (34-66%) N/A N/A Granulation Amount: Red N/A N/A Granulation Quality: Small (1-33%) N/A N/A Necrotic Amount: Fat Layer (Subcutaneous Tissue): Yes N/A N/A Exposed Structures: Fascia: No Tendon: No Muscle: No Joint: No Bone: No None N/A N/A Epithelialization: Treatment Notes Electronic Signature(s) Signed: 05/31/2023 5:12:55 PM By: Betha Loa Entered By: Betha Loa on 05/30/2023 10:25:17 [name] ([id number]) [id number] Page 6 of 12 -------------------------------------------------------------------------------- Multi-Disciplinary Care Plan Details Patient Name: Date of Service: Laura Santiago. 05/30/2023 10:00 A M Medical Record Number: [id number] Patient Account Number: [id number] Date of Birth/Sex: Treating  RN: 09-14-1946 (77 y.o. Skip Mayer Primary Care Giulliana Mcroberts: Marisue Ivan Other Clinician: Betha Loa Referring Sharlett Lienemann: Treating Leen Tworek/Extender: Claudie Fisherman Weeks in Treatment: 46 Active Inactive Electronic Signature(s) Signed: 05/31/2023 5:12:55 PM By: Betha Loa Signed: 06/04/2023 9:09:50 AM By: Elliot Gurney, BSN, RN, CWS, [name] RN, BSN Entered By: Betha Loa on 05/30/2023 13:44:04 -------------------------------------------------------------------------------- Pain Assessment Details Patient Name: Date of Service: Laura Santiago [name] 05/30/2023 10:00 A M Medical Record Number: [id number] Patient Account Number: [id number] Date of Birth/Sex: Treating RN: 10/25/1946 (77 y.o. Skip Mayer Primary Care Norah Fick: Marisue Ivan Other Clinician: Betha Loa Referring Symphony Demuro: Treating Mayana Irigoyen/Extender: Greta Doom in Treatment: 40 Active Problems Location of Pain Severity and Description of Pain Patient Has Paino No Site Locations AELYN, STANALAND Graham ([id number]) [id number] Page 7 of 12 Pain Management and Medication Current Pain Management: Electronic Signature(s) Signed: 05/31/2023 5:12:55 PM By: Betha Loa Signed: 06/04/2023 9:09:50 AM By: Elliot Gurney, BSN, RN, CWS, [name] RN, BSN Entered By: Betha Loa on 05/30/2023 10:15:57 -------------------------------------------------------------------------------- Patient/Caregiver Education Details Patient Name: Date of Service: Laura Santiago [name] 6/19/2024andnbsp10:00 A M Medical Record Number: [id number] Patient Account Number: [id number] Date of Birth/Gender: Treating RN: 1946/04/03 (77 y.o. Skip Mayer Primary Care Physician: Burnadette Pop,  Trisha Mangle Other Clinician: Betha Loa Referring Physician: Treating Physician/Extender: Greta Doom in Treatment: 38 Education Assessment Education Provided  To: Patient Education Topics Provided Wound/Skin Impairment: Handouts: Other: continue wound care as directedc Methods: Explain/Verbal Responses: State content correctly Electronic Signature(s) Signed: 05/31/2023 5:12:55 PM By: Betha Loa Entered By: Betha Loa on 05/30/2023 11:06:13 -------------------------------------------------------------------------------- Wound Assessment Details Patient Name: Date of Service: Laura Santiago [name] 05/30/2023 10:00 A M Medical Record Number: [id number] Patient Account Number: [id number] Date of Birth/Sex: Treating RN: [birth year] (77 y.o. Skip Mayer Primary Care Carri Spillers: Marisue Ivan Other Clinician: Betha Loa Referring Mayla Biddy: Treating Brianca Fortenberry/Extender: Greta Doom in Treatment: [address] ZADIA, UHDE N ([id number]) [id number] Page 8 of 12 Wound Number: 10 Primary Venous Leg Ulcer Etiology: Wound Location: Left, Posterior Lower Leg Wound Status: Open Wounding Event: Gradually Appeared Comorbid Hypertension, Osteoarthritis, Received Chemotherapy, Date Acquired: 03/04/2023 History: Received Radiation Weeks Of Treatment: 11 Clustered Wound: Yes Photos Wound Measurements Length: (cm) 0.3 Width: (cm) 0.9 Depth: (cm) 0.1 Area: (cm) 0.212 Volume: (cm) 0.021 % Reduction in Area: 99.6% % Reduction in Volume: 99.6% Wound Description Classification: Full Thickness Without Exposed Suppor Wound Margin: Distinct, outline attached Exudate Amount: Medium Exudate Type: Serosanguineous Exudate Color: red, brown t Structures Foul Odor After Cleansing: No Slough/Fibrino Yes Wound Bed Granulation Amount: Small (1-33%) Exposed Structure Granulation Quality: Red Fascia Exposed: No Necrotic Amount: Large (67-100%) Fat Layer (Subcutaneous Tissue) Exposed: Yes Tendon Exposed: No Muscle Exposed: No Joint Exposed: No Bone Exposed: No Electronic  Signature(s) Signed: 05/31/2023 5:12:55 PM By: Betha Loa Signed: 06/04/2023 9:09:50 AM By: Elliot Gurney, BSN, RN, CWS, [name] RN, BSN Entered By: Betha Loa on 05/30/2023 10:22:15 -------------------------------------------------------------------------------- Wound Assessment Details Patient Name: Date of Service: Laura Santiago [name] 05/30/2023 10:00 A M Medical Record Number: [id number] Patient Account Number: [id number] Date of Birth/Sex: Treating RN: 02-26-1946 (77 y.o. Skip Mayer Primary Care Sheily Lineman: Marisue Ivan Other Clinician: Betha Loa Referring Wilba Mutz: Treating Heidy Mccubbin/Extender: Claudie Fisherman Weeks in Treatment: 99 Wound Status Wound Number: 11 Primary Venous Leg Ulcer BHAKTI, LABELLA ([id number]) [id number] Page 9 of 12 Etiology: Wound Location: Left, Lateral Lower Leg Wound Status: Open Wounding Event: Gradually Appeared Comorbid Hypertension, Osteoarthritis, Received Chemotherapy, Date Acquired: 03/04/2023 History: Received Radiation Weeks Of Treatment: 11 Clustered Wound: No Photos Wound Measurements Length: (cm) 4.8 Width: (cm) 2.9 Depth: (cm) 0.2 Area: (cm) 10.933 Volume: (cm) 2.187 % Reduction in Area: -719% % Reduction in Volume: -1532.1% Epithelialization: None Wound Description Classification: Full Thickness Without Exposed Suppor Wound Margin: Distinct, outline attached Exudate Amount: Small Exudate Type: Serosanguineous Exudate Color: red, brown t Structures Foul Odor After Cleansing: No Slough/Fibrino Yes Wound Bed Granulation Amount: Large (67-100%) Exposed Structure Granulation Quality: Red Fascia Exposed: No Necrotic Amount: Small (1-33%) Fat Layer (Subcutaneous Tissue) Exposed: Yes Necrotic Quality: Adherent Slough Tendon Exposed: No Muscle Exposed: No Joint Exposed: No Bone Exposed: No Electronic Signature(s) Signed: 05/31/2023 5:12:55 PM By: Betha Loa Signed:  06/04/2023 9:09:50 AM By: Elliot Gurney, BSN, RN, CWS, [name] RN, BSN Entered By: Betha Loa on 05/30/2023 10:22:45 -------------------------------------------------------------------------------- Wound Assessment Details Patient Name: Date of Service: Laura Santiago [name] 05/30/2023 10:00 A M Medical Record Number: [id number] Patient Account Number: [id number] Date of Birth/Sex: Treating RN: 12-28-1945 (77 y.o. Skip Mayer Primary Care Casmer Yepiz: Marisue Ivan Other Clinician: Betha Loa Referring Jeiry Birnbaum: Treating Ramia Sidney/Extender: Claudie Fisherman Weeks in Treatment: 64 Wound Status Wound Number: 12 Primary Venous Leg Ulcer Etiology: Aleman, Destiny  M ([id number]) [id number] Page 10 of 12 Etiology: Wound Location: Left, Medial Ankle Wound Status: Open Wounding Event: Gradually Appeared Comorbid Hypertension, Osteoarthritis, Received Chemotherapy, Date Acquired: 03/04/2023 History: Received Radiation Weeks Of Treatment: 11 Clustered Wound: Yes Photos Wound Measurements Length: (cm) 1.8 Width: (cm) 0.8 Depth: (cm) 0.1 Area: (cm) 1.131 Volume: (cm) 0.113 % Reduction in Area: 62.5% % Reduction in Volume: 62.6% Epithelialization: None Wound Description Classification: Full Thickness Without Exposed Suppor Wound Margin: Distinct, outline attached Exudate Amount: Medium Exudate Type: Serosanguineous Exudate Color: red, brown t Structures Foul Odor After Cleansing: No Slough/Fibrino Yes Wound Bed Granulation Amount: Small (1-33%) Exposed Structure Granulation Quality: Pink Fascia Exposed: No Necrotic Amount: Large (67-100%) Fat Layer (Subcutaneous Tissue) Exposed: Yes Necrotic Quality: Adherent Slough Tendon Exposed: No Muscle Exposed: No Joint Exposed: No Bone Exposed: No Electronic Signature(s) Signed: 05/31/2023 5:12:55 PM By: Betha Loa Signed: 06/04/2023 9:09:50 AM By: Elliot Gurney, BSN, RN, CWS, [name] RN, BSN Entered  By: Betha Loa on 05/30/2023 10:23:24 -------------------------------------------------------------------------------- Wound Assessment Details Patient Name: Date of Service: Laura Santiago [name] 05/30/2023 10:00 A M Medical Record Number: [id number] Patient Account Number: [id number] Date of Birth/Sex: Treating RN: 11-Dec-1946 (77 y.o. Skip Mayer Primary Care Suzzanne Brunkhorst: Marisue Ivan Other Clinician: Betha Loa Referring Erinne Gillentine: Treating Jamillia Closson/Extender: Claudie Fisherman Weeks in Treatment: 13 Wound Status Wound Number: 13 Primary Venous Leg Ulcer Etiology: Wound Location: Left, Lateral, Posterior Lower Leg MAEDELL, HEDGER ([id number]) [id number] Page 11 of 12 Wound Status: Healed - Epithelialized Wounding Event: Not Known Comorbid Hypertension, Osteoarthritis, Received Chemotherapy, Date Acquired: 02/14/2023 History: Received Radiation Weeks Of Treatment: 8 Clustered Wound: No Photos Wound Measurements Length: (cm) Width: (cm) Depth: (cm) Area: (cm) Volume: (cm) 0 % Reduction in Area: 100% 0 % Reduction in Volume: 100% 0 Epithelialization: Large (67-100%) 0 0 Wound Description Classification: Full Thickness Without Exposed Support Exudate Amount: None Present Structures Foul Odor After Cleansing: No Slough/Fibrino No Wound Bed Granulation Amount: None Present (0%) Exposed Structure Necrotic Amount: None Present (0%) Fascia Exposed: No Fat Layer (Subcutaneous Tissue) Exposed: Yes Tendon Exposed: No Muscle Exposed: No Joint Exposed: No Bone Exposed: No Treatment Notes Wound #13 (Lower Leg) Wound Laterality: Left, Lateral, Posterior Cleanser Peri-Wound Care Topical Primary Dressing Secondary Dressing Secured With Compression Wrap Compression Stockings Add-Ons Electronic Signature(s) Signed: 05/31/2023 5:12:55 PM By: Betha Loa Signed: 06/04/2023 9:09:50 AM By: Elliot Gurney, BSN, RN, CWS, [name] RN,  BSN Entered By: Betha Loa on 05/30/2023 10:45:54 [name] ([id number]) [id number] Page 12 of 12 -------------------------------------------------------------------------------- Vitals Details Patient Name: Date of Service: Laura Santiago. 05/30/2023 10:00 A M Medical Record Number: [id number] Patient Account Number: [id number] Date of Birth/Sex: Treating RN: 1946/03/04 (77 y.o. [name], [name] Primary Care [place] Delauder: Marisue Ivan Other Clinician: Betha Loa Referring Nimrod Wendt: Treating Notnamed Scholz/Extender: Greta Doom in Treatment: 33 Vital Signs Time Taken: 10:12 Temperature (F): 98.2 Height (in): 66 Pulse (bpm): 64 Weight (lbs): 153 Respiratory Rate (breaths/min): 18 Body Mass Index (BMI): 24.7 Blood Pressure (mmHg): 138/71 Reference Range: 80 - 120 mg / dl Electronic Signature(s) Signed: 05/31/2023 5:12:55 PM By: Betha Loa Entered By: Betha Loa on 05/30/2023 10:15:52

## 2023-06-06 ENCOUNTER — Ambulatory Visit: Payer: Medicare Other | Admitting: Internal Medicine

## 2023-06-06 ENCOUNTER — Encounter: Payer: Self-pay | Admitting: *Deleted

## 2023-06-12 ENCOUNTER — Encounter: Payer: Self-pay | Admitting: Oncology

## 2023-07-09 ENCOUNTER — Encounter: Payer: Self-pay | Admitting: Oncology

## 2023-12-13 ENCOUNTER — Encounter: Payer: Self-pay | Admitting: Oncology
